# Patient Record
Sex: Male | Born: 1940 | Race: Black or African American | Hispanic: No | Marital: Single | Smoking: Former smoker
Health system: Southern US, Community
[De-identification: ages and names within clinical notes are randomized; demographics above are authoritative.]

## PROBLEM LIST (undated history)

## (undated) DIAGNOSIS — I739 Peripheral vascular disease, unspecified: Secondary | ICD-10-CM

## (undated) DIAGNOSIS — I517 Cardiomegaly: Secondary | ICD-10-CM

## (undated) DIAGNOSIS — I10 Essential (primary) hypertension: Secondary | ICD-10-CM

## (undated) DIAGNOSIS — M542 Cervicalgia: Secondary | ICD-10-CM

## (undated) DIAGNOSIS — E785 Hyperlipidemia, unspecified: Secondary | ICD-10-CM

## (undated) DIAGNOSIS — J40 Bronchitis, not specified as acute or chronic: Secondary | ICD-10-CM

## (undated) DIAGNOSIS — E559 Vitamin D deficiency, unspecified: Secondary | ICD-10-CM

## (undated) DIAGNOSIS — R600 Localized edema: Secondary | ICD-10-CM

## (undated) DIAGNOSIS — R0602 Shortness of breath: Secondary | ICD-10-CM

## (undated) DIAGNOSIS — N179 Acute kidney failure, unspecified: Secondary | ICD-10-CM

## (undated) DIAGNOSIS — D539 Nutritional anemia, unspecified: Secondary | ICD-10-CM

## (undated) DIAGNOSIS — J9601 Acute respiratory failure with hypoxia: Secondary | ICD-10-CM

## (undated) DIAGNOSIS — IMO0002 Reserved for concepts with insufficient information to code with codable children: Secondary | ICD-10-CM

## (undated) DIAGNOSIS — F1010 Alcohol abuse, uncomplicated: Secondary | ICD-10-CM

## (undated) DIAGNOSIS — K810 Acute cholecystitis: Secondary | ICD-10-CM

## (undated) DIAGNOSIS — I248 Other forms of acute ischemic heart disease: Secondary | ICD-10-CM

## (undated) DIAGNOSIS — Q249 Congenital malformation of heart, unspecified: Secondary | ICD-10-CM

## (undated) DIAGNOSIS — R5383 Other fatigue: Secondary | ICD-10-CM

## (undated) DIAGNOSIS — R570 Cardiogenic shock: Secondary | ICD-10-CM

## (undated) DIAGNOSIS — N183 Chronic kidney disease, stage 3 unspecified: Secondary | ICD-10-CM

## (undated) DIAGNOSIS — E119 Type 2 diabetes mellitus without complications: Secondary | ICD-10-CM

## (undated) DIAGNOSIS — N289 Disorder of kidney and ureter, unspecified: Secondary | ICD-10-CM

## (undated) DIAGNOSIS — I5042 Chronic combined systolic (congestive) and diastolic (congestive) heart failure: Secondary | ICD-10-CM

## (undated) DIAGNOSIS — I2489 Other forms of acute ischemic heart disease: Secondary | ICD-10-CM

## (undated) DIAGNOSIS — M549 Dorsalgia, unspecified: Secondary | ICD-10-CM

## (undated) DIAGNOSIS — R7989 Other specified abnormal findings of blood chemistry: Secondary | ICD-10-CM

## (undated) DIAGNOSIS — K8031 Calculus of bile duct with cholangitis, unspecified, with obstruction: Secondary | ICD-10-CM

## (undated) DIAGNOSIS — G8929 Other chronic pain: Secondary | ICD-10-CM

## (undated) DIAGNOSIS — Q600 Renal agenesis, unilateral: Secondary | ICD-10-CM

## (undated) HISTORY — DX: Acute kidney failure, unspecified: N17.9

## (undated) HISTORY — DX: Vitamin D deficiency, unspecified: E55.9

## (undated) HISTORY — DX: Acute respiratory failure with hypoxia: J96.01

## (undated) HISTORY — DX: Congenital malformation of heart, unspecified: Q24.9

## (undated) HISTORY — DX: Localized edema: R60.0

## (undated) HISTORY — DX: Other specified abnormal findings of blood chemistry: R79.89

## (undated) HISTORY — DX: Dorsalgia, unspecified: M54.9

## (undated) HISTORY — DX: Other fatigue: R53.83

## (undated) HISTORY — DX: Shortness of breath: R06.02

## (undated) HISTORY — DX: Bronchitis, not specified as acute or chronic: J40

## (undated) HISTORY — DX: Other chronic pain: G89.29

## (undated) HISTORY — DX: Essential (primary) hypertension: I10

## (undated) HISTORY — DX: Other forms of acute ischemic heart disease: I24.8

## (undated) HISTORY — DX: Other forms of acute ischemic heart disease: I24.89

## (undated) HISTORY — DX: Peripheral vascular disease, unspecified: I73.9

## (undated) HISTORY — DX: Cervicalgia: M54.2

## (undated) HISTORY — DX: Disorder of kidney and ureter, unspecified: N28.9

## (undated) HISTORY — DX: Cardiomegaly: I51.7

## (undated) HISTORY — DX: Alcohol abuse, uncomplicated: F10.10

## (undated) HISTORY — DX: Acute cholecystitis: K81.0

## (undated) HISTORY — DX: Nutritional anemia, unspecified: D53.9

---

## 2015-02-26 ENCOUNTER — Inpatient Hospital Stay (HOSPITAL_COMMUNITY): Payer: 59

## 2015-02-26 ENCOUNTER — Inpatient Hospital Stay (HOSPITAL_COMMUNITY)
Admission: EM | Admit: 2015-02-26 | Discharge: 2015-02-27 | DRG: 291 | Disposition: A | Discharge status: Home / Self Care | Payer: 59 | Attending: Internal Medicine | Admitting: Internal Medicine

## 2015-02-26 ENCOUNTER — Emergency Department (HOSPITAL_COMMUNITY): Payer: 59

## 2015-02-26 ENCOUNTER — Encounter (HOSPITAL_COMMUNITY): Payer: Self-pay | Admitting: Emergency Medicine

## 2015-02-26 DIAGNOSIS — E782 Mixed hyperlipidemia: Secondary | ICD-10-CM | POA: Diagnosis present

## 2015-02-26 DIAGNOSIS — J80 Acute respiratory distress syndrome: Secondary | ICD-10-CM | POA: Diagnosis not present

## 2015-02-26 DIAGNOSIS — F1010 Alcohol abuse, uncomplicated: Secondary | ICD-10-CM

## 2015-02-26 DIAGNOSIS — N183 Chronic kidney disease, stage 3 unspecified: Secondary | ICD-10-CM | POA: Insufficient documentation

## 2015-02-26 DIAGNOSIS — I429 Cardiomyopathy, unspecified: Secondary | ICD-10-CM | POA: Diagnosis present

## 2015-02-26 DIAGNOSIS — I509 Heart failure, unspecified: Secondary | ICD-10-CM

## 2015-02-26 DIAGNOSIS — I130 Hypertensive heart and chronic kidney disease with heart failure and stage 1 through stage 4 chronic kidney disease, or unspecified chronic kidney disease: Secondary | ICD-10-CM | POA: Diagnosis present

## 2015-02-26 DIAGNOSIS — I5023 Acute on chronic systolic (congestive) heart failure: Secondary | ICD-10-CM | POA: Diagnosis not present

## 2015-02-26 DIAGNOSIS — E119 Type 2 diabetes mellitus without complications: Secondary | ICD-10-CM | POA: Diagnosis not present

## 2015-02-26 DIAGNOSIS — E785 Hyperlipidemia, unspecified: Secondary | ICD-10-CM

## 2015-02-26 DIAGNOSIS — R0603 Acute respiratory distress: Secondary | ICD-10-CM | POA: Diagnosis present

## 2015-02-26 DIAGNOSIS — I248 Other forms of acute ischemic heart disease: Secondary | ICD-10-CM | POA: Insufficient documentation

## 2015-02-26 DIAGNOSIS — I5042 Chronic combined systolic (congestive) and diastolic (congestive) heart failure: Secondary | ICD-10-CM

## 2015-02-26 DIAGNOSIS — R0602 Shortness of breath: Secondary | ICD-10-CM | POA: Diagnosis present

## 2015-02-26 DIAGNOSIS — Z87891 Personal history of nicotine dependence: Secondary | ICD-10-CM | POA: Diagnosis not present

## 2015-02-26 DIAGNOSIS — J9601 Acute respiratory failure with hypoxia: Secondary | ICD-10-CM | POA: Diagnosis not present

## 2015-02-26 DIAGNOSIS — R778 Other specified abnormalities of plasma proteins: Secondary | ICD-10-CM

## 2015-02-26 DIAGNOSIS — R7989 Other specified abnormal findings of blood chemistry: Secondary | ICD-10-CM | POA: Diagnosis present

## 2015-02-26 DIAGNOSIS — N179 Acute kidney failure, unspecified: Secondary | ICD-10-CM | POA: Insufficient documentation

## 2015-02-26 DIAGNOSIS — I10 Essential (primary) hypertension: Secondary | ICD-10-CM | POA: Diagnosis not present

## 2015-02-26 DIAGNOSIS — I2489 Other forms of acute ischemic heart disease: Secondary | ICD-10-CM | POA: Insufficient documentation

## 2015-02-26 DIAGNOSIS — I5043 Acute on chronic combined systolic (congestive) and diastolic (congestive) heart failure: Secondary | ICD-10-CM | POA: Insufficient documentation

## 2015-02-26 DIAGNOSIS — R000 Tachycardia, unspecified: Secondary | ICD-10-CM | POA: Diagnosis present

## 2015-02-26 HISTORY — DX: Reserved for concepts with insufficient information to code with codable children: IMO0002

## 2015-02-26 HISTORY — DX: Renal agenesis, unilateral: Q60.0

## 2015-02-26 HISTORY — DX: Hyperlipidemia, unspecified: E78.5

## 2015-02-26 HISTORY — DX: Essential (primary) hypertension: I10

## 2015-02-26 HISTORY — DX: Other specified abnormal findings of blood chemistry: R79.89

## 2015-02-26 HISTORY — DX: Type 2 diabetes mellitus without complications: E11.9

## 2015-02-26 HISTORY — DX: Alcohol abuse, uncomplicated: F10.10

## 2015-02-26 HISTORY — DX: Other specified abnormalities of plasma proteins: R77.8

## 2015-02-26 LAB — I-STAT TROPONIN, ED: Troponin i, poc: 0.1 ng/mL (ref 0.00–0.08)

## 2015-02-26 LAB — COMPREHENSIVE METABOLIC PANEL
ALT: 24 U/L (ref 17–63)
AST: 34 U/L (ref 15–41)
Albumin: 3.8 g/dL (ref 3.5–5.0)
Alkaline Phosphatase: 56 U/L (ref 38–126)
Anion gap: 10 (ref 5–15)
BUN: 21 mg/dL — ABNORMAL HIGH (ref 6–20)
CO2: 20 mmol/L — ABNORMAL LOW (ref 22–32)
Calcium: 8.5 mg/dL — ABNORMAL LOW (ref 8.9–10.3)
Chloride: 108 mmol/L (ref 101–111)
Creatinine, Ser: 1.56 mg/dL — ABNORMAL HIGH (ref 0.61–1.24)
GFR calc Af Amer: 50 mL/min — ABNORMAL LOW (ref >60)
GFR calc non Af Amer: 43 mL/min — ABNORMAL LOW (ref >60)
Glucose, Bld: 242 mg/dL — ABNORMAL HIGH (ref 65–99)
Potassium: 3.8 mmol/L (ref 3.5–5.1)
Sodium: 138 mmol/L (ref 135–145)
Total Bilirubin: 0.7 mg/dL (ref 0.3–1.2)
Total Protein: 8.3 g/dL — ABNORMAL HIGH (ref 6.5–8.1)

## 2015-02-26 LAB — I-STAT ARTERIAL BLOOD GAS, ED
Acid-base deficit: 6 mmol/L — ABNORMAL HIGH (ref 0.0–2.0)
Bicarbonate: 22.7 mEq/L (ref 20.0–24.0)
O2 Saturation: 100 %
Patient temperature: 98.6
TCO2: 24 mmol/L (ref 0–100)
pCO2 arterial: 58.7 mmHg (ref 35.0–45.0)
pH, Arterial: 7.196 — CL (ref 7.350–7.450)
pO2, Arterial: 280 mmHg — ABNORMAL HIGH (ref 80.0–100.0)

## 2015-02-26 LAB — LIPID PANEL
Cholesterol: 208 mg/dL — ABNORMAL HIGH (ref 0–200)
HDL: 44 mg/dL (ref >40)
LDL Cholesterol: 135 mg/dL — ABNORMAL HIGH (ref 0–99)
Total CHOL/HDL Ratio: 4.7 RATIO
Triglycerides: 146 mg/dL (ref <150)
VLDL: 29 mg/dL (ref 0–40)

## 2015-02-26 LAB — CBC
HCT: 42.3 % (ref 39.0–52.0)
Hemoglobin: 13.6 g/dL (ref 13.0–17.0)
MCH: 30.8 pg (ref 26.0–34.0)
MCHC: 32.2 g/dL (ref 30.0–36.0)
MCV: 95.9 fL (ref 78.0–100.0)
Platelets: 219 10*3/uL (ref 150–400)
RBC: 4.41 MIL/uL (ref 4.22–5.81)
RDW: 13.9 % (ref 11.5–15.5)
WBC: 10.4 10*3/uL (ref 4.0–10.5)

## 2015-02-26 LAB — CBC WITH DIFFERENTIAL/PLATELET
Basophils Absolute: 0.1 10*3/uL (ref 0.0–0.1)
Basophils Relative: 1 % (ref 0–1)
Eosinophils Absolute: 0.3 10*3/uL (ref 0.0–0.7)
Eosinophils Relative: 3 % (ref 0–5)
HCT: 43.2 % (ref 39.0–52.0)
Hemoglobin: 13.7 g/dL (ref 13.0–17.0)
Lymphocytes Relative: 39 % (ref 12–46)
Lymphs Abs: 3.7 10*3/uL (ref 0.7–4.0)
MCH: 30.6 pg (ref 26.0–34.0)
MCHC: 31.7 g/dL (ref 30.0–36.0)
MCV: 96.4 fL (ref 78.0–100.0)
Monocytes Absolute: 0.4 10*3/uL (ref 0.1–1.0)
Monocytes Relative: 4 % (ref 3–12)
Neutro Abs: 5.1 10*3/uL (ref 1.7–7.7)
Neutrophils Relative %: 53 % (ref 43–77)
Platelets: 253 10*3/uL (ref 150–400)
RBC: 4.48 MIL/uL (ref 4.22–5.81)
RDW: 13.9 % (ref 11.5–15.5)
WBC: 9.5 10*3/uL (ref 4.0–10.5)

## 2015-02-26 LAB — TROPONIN I
Troponin I: 0.05 ng/mL — ABNORMAL HIGH (ref <0.031)
Troponin I: 0.11 ng/mL — ABNORMAL HIGH (ref <0.031)
Troponin I: 0.09 ng/mL — ABNORMAL HIGH (ref <0.031)
Troponin I: 0.09 ng/mL — ABNORMAL HIGH (ref <0.031)

## 2015-02-26 LAB — TSH: TSH: 1.106 u[IU]/mL (ref 0.350–4.500)

## 2015-02-26 LAB — MRSA PCR SCREENING: MRSA by PCR: NEGATIVE

## 2015-02-26 LAB — GLUCOSE, CAPILLARY
Glucose-Capillary: 169 mg/dL — ABNORMAL HIGH (ref 65–99)
Glucose-Capillary: 156 mg/dL — ABNORMAL HIGH (ref 65–99)
Glucose-Capillary: 180 mg/dL — ABNORMAL HIGH (ref 65–99)
Glucose-Capillary: 161 mg/dL — ABNORMAL HIGH (ref 65–99)

## 2015-02-26 LAB — MAGNESIUM: Magnesium: 2.4 mg/dL (ref 1.7–2.4)

## 2015-02-26 LAB — PROTIME-INR
INR: 1.11 (ref 0.00–1.49)
Prothrombin Time: 14.5 seconds (ref 11.6–15.2)

## 2015-02-26 LAB — APTT: aPTT: 27 seconds (ref 24–37)

## 2015-02-26 LAB — BRAIN NATRIURETIC PEPTIDE: B Natriuretic Peptide: 405.5 pg/mL — ABNORMAL HIGH (ref 0.0–100.0)

## 2015-02-26 LAB — CREATININE, SERUM
Creatinine, Ser: 1.72 mg/dL — ABNORMAL HIGH (ref 0.61–1.24)
GFR calc Af Amer: 44 mL/min — ABNORMAL LOW (ref >60)
GFR calc non Af Amer: 38 mL/min — ABNORMAL LOW (ref >60)

## 2015-02-26 MED ORDER — LORAZEPAM 2 MG/ML IJ SOLN: 1.0000 mg | Freq: Four times a day (QID) | INTRAMUSCULAR | Status: DC | PRN

## 2015-02-26 MED ORDER — LORAZEPAM 2 MG/ML IJ SOLN
INTRAMUSCULAR | Status: AC
  Filled 2015-02-26: qty 1

## 2015-02-26 MED ORDER — SODIUM CHLORIDE 0.9 % IJ SOLN
3.0000 mL | Freq: Two times a day (BID) | INTRAMUSCULAR | Status: DC
  Administered 2015-02-26 (×2): 3 mL via INTRAVENOUS

## 2015-02-26 MED ORDER — LORAZEPAM 2 MG/ML IJ SOLN: 0.0000 mg | Freq: Four times a day (QID) | INTRAMUSCULAR | Status: DC

## 2015-02-26 MED ORDER — SODIUM CHLORIDE 0.9 % IJ SOLN: 3.0000 mL | INTRAMUSCULAR | Status: DC | PRN

## 2015-02-26 MED ORDER — AMLODIPINE BESYLATE 10 MG PO TABS
10.0000 mg | ORAL_TABLET | Freq: Every day | ORAL | Status: DC
  Administered 2015-02-26: 10 mg via ORAL
  Filled 2015-02-26 (×2): qty 1

## 2015-02-26 MED ORDER — NITROGLYCERIN 0.1 MG/HR TD PT24
0.1000 mg | MEDICATED_PATCH | Freq: Every day | TRANSDERMAL | Status: DC
  Administered 2015-02-26: 0.1 mg via TRANSDERMAL
  Filled 2015-02-26 (×2): qty 1

## 2015-02-26 MED ORDER — VITAMIN B-1 100 MG PO TABS
100.0000 mg | ORAL_TABLET | Freq: Every day | ORAL | Status: DC
  Administered 2015-02-26 – 2015-02-27 (×2): 100 mg via ORAL
  Filled 2015-02-26 (×2): qty 1

## 2015-02-26 MED ORDER — FUROSEMIDE 10 MG/ML IJ SOLN
40.0000 mg | Freq: Once | INTRAMUSCULAR | Status: AC
Start: 2015-02-26 — End: 2015-02-26
  Administered 2015-02-26: 40 mg via INTRAVENOUS
  Filled 2015-02-26: qty 4

## 2015-02-26 MED ORDER — NITROGLYCERIN IN D5W 200-5 MCG/ML-% IV SOLN
0.0000 ug/min | Freq: Once | INTRAVENOUS | Status: AC
  Administered 2015-02-26: 5 ug/min via INTRAVENOUS
  Filled 2015-02-26: qty 250

## 2015-02-26 MED ORDER — ASPIRIN EC 325 MG PO TBEC
325.0000 mg | DELAYED_RELEASE_TABLET | Freq: Every day | ORAL | Status: DC
  Administered 2015-02-26: 325 mg via ORAL
  Filled 2015-02-26: qty 1

## 2015-02-26 MED ORDER — CARVEDILOL 6.25 MG PO TABS
6.2500 mg | ORAL_TABLET | Freq: Two times a day (BID) | ORAL | Status: DC
  Administered 2015-02-26 – 2015-02-27 (×3): 6.25 mg via ORAL
  Filled 2015-02-26 (×5): qty 1

## 2015-02-26 MED ORDER — DM-GUAIFENESIN ER 30-600 MG PO TB12
1.0000 | ORAL_TABLET | Freq: Two times a day (BID) | ORAL | Status: DC
  Administered 2015-02-26 – 2015-02-27 (×3): 1 via ORAL
  Filled 2015-02-26 (×4): qty 1

## 2015-02-26 MED ORDER — IPRATROPIUM-ALBUTEROL 0.5-2.5 (3) MG/3ML IN SOLN: 3.0000 mL | RESPIRATORY_TRACT | Status: DC | PRN

## 2015-02-26 MED ORDER — SODIUM CHLORIDE 0.9 % IV SOLN: 250.0000 mL | INTRAVENOUS | Status: DC | PRN

## 2015-02-26 MED ORDER — ONDANSETRON HCL 4 MG/2ML IJ SOLN: 4.0000 mg | Freq: Four times a day (QID) | INTRAMUSCULAR | Status: DC | PRN

## 2015-02-26 MED ORDER — METOPROLOL TARTRATE 1 MG/ML IV SOLN
5.0000 mg | Freq: Once | INTRAVENOUS | Status: AC
  Administered 2015-02-26: 5 mg via INTRAVENOUS

## 2015-02-26 MED ORDER — LORAZEPAM 2 MG/ML IJ SOLN
0.5000 mg | Freq: Once | INTRAMUSCULAR | Status: AC
  Administered 2015-02-26: 0.5 mg via INTRAVENOUS

## 2015-02-26 MED ORDER — LORAZEPAM 1 MG PO TABS: 1.0000 mg | ORAL_TABLET | Freq: Four times a day (QID) | ORAL | Status: DC | PRN

## 2015-02-26 MED ORDER — THIAMINE HCL 100 MG/ML IJ SOLN
100.0000 mg | Freq: Every day | INTRAMUSCULAR | Status: DC
  Filled 2015-02-26 (×2): qty 1

## 2015-02-26 MED ORDER — FERROUS SULFATE 325 (65 FE) MG PO TABS
325.0000 mg | ORAL_TABLET | Freq: Every day | ORAL | Status: DC
  Administered 2015-02-26 – 2015-02-27 (×2): 325 mg via ORAL
  Filled 2015-02-26 (×3): qty 1

## 2015-02-26 MED ORDER — INSULIN ASPART 100 UNIT/ML ~~LOC~~ SOLN
0.0000 [IU] | Freq: Three times a day (TID) | SUBCUTANEOUS | Status: DC
  Administered 2015-02-26 – 2015-02-27 (×3): 2 [IU] via SUBCUTANEOUS

## 2015-02-26 MED ORDER — LORAZEPAM 2 MG/ML IJ SOLN: 0.0000 mg | Freq: Two times a day (BID) | INTRAMUSCULAR | Status: DC

## 2015-02-26 MED ORDER — SIMVASTATIN 40 MG PO TABS
40.0000 mg | ORAL_TABLET | Freq: Every day | ORAL | Status: DC
  Administered 2015-02-26: 40 mg via ORAL
  Filled 2015-02-26 (×2): qty 1

## 2015-02-26 MED ORDER — ADULT MULTIVITAMIN W/MINERALS CH
1.0000 | ORAL_TABLET | Freq: Every day | ORAL | Status: DC
  Administered 2015-02-26 – 2015-02-27 (×2): 1 via ORAL
  Filled 2015-02-26 (×2): qty 1

## 2015-02-26 MED ORDER — NITROGLYCERIN IN D5W 200-5 MCG/ML-% IV SOLN
0.0000 ug/min | INTRAVENOUS | Status: DC
  Administered 2015-02-26: 5 ug/min via INTRAVENOUS
  Filled 2015-02-26: qty 250

## 2015-02-26 MED ORDER — ACETAMINOPHEN 325 MG PO TABS: 650.0000 mg | ORAL_TABLET | ORAL | Status: DC | PRN

## 2015-02-26 MED ORDER — FUROSEMIDE 10 MG/ML IJ SOLN
40.0000 mg | Freq: Every day | INTRAMUSCULAR | Status: DC
Start: 2015-02-26 — End: 2015-02-27
  Administered 2015-02-26: 40 mg via INTRAVENOUS
  Filled 2015-02-26 (×2): qty 4

## 2015-02-26 MED ORDER — FOLIC ACID 1 MG PO TABS
1.0000 mg | ORAL_TABLET | Freq: Every day | ORAL | Status: DC
  Administered 2015-02-26 – 2015-02-27 (×2): 1 mg via ORAL
  Filled 2015-02-26 (×2): qty 1

## 2015-02-26 MED ORDER — METHYLPREDNISOLONE SODIUM SUCC 40 MG IJ SOLR
40.0000 mg | Freq: Every day | INTRAMUSCULAR | Status: DC
  Administered 2015-02-26: 40 mg via INTRAVENOUS
  Filled 2015-02-26: qty 1

## 2015-02-26 MED ORDER — HYDRALAZINE HCL 20 MG/ML IJ SOLN: 5.0000 mg | INTRAMUSCULAR | Status: DC | PRN

## 2015-02-26 MED ORDER — HEPARIN SODIUM (PORCINE) 5000 UNIT/ML IJ SOLN
5000.0000 [IU] | Freq: Three times a day (TID) | INTRAMUSCULAR | Status: DC
  Filled 2015-02-26 (×3): qty 1

## 2015-02-26 NOTE — Progress Notes (Signed)
PT Cancellation Note  Patient Details Name: [name] MRN: [id number] DOB: 12/10/1940   Cancelled Treatment:    Reason Eval/Treat Not Completed: Medical issues which prohibited therapy Noted that troponin elevated further since admission. Per departmental protocol will hold until troponin demonstrates downward trend or cleared by MD.  Ellouise Newer 02/26/2015, 8:55 AM Elayne Snare, [place]

## 2015-02-26 NOTE — Progress Notes (Signed)
  Echocardiogram 2D Echocardiogram has been performed.  Donata Clay 02/26/2015, 9:52 AM

## 2015-02-26 NOTE — Progress Notes (Signed)
Utilization review completed. Bertha Stanfill, RN, BSN. 

## 2015-02-26 NOTE — Progress Notes (Signed)
Spoke with Dr. Blaine Hamper about continuous NTG gtt. Orders received to continue gtt until 1000 when patch is due. Will continue to monitor.

## 2015-02-26 NOTE — Progress Notes (Signed)
RT took pt off of BiPAP at this time, pt tolerating well. Placed pt on 4L nasal cannula O2 sats 95% and greater with a HR of 87. RT will continue to monitor.

## 2015-02-26 NOTE — Progress Notes (Signed)
Attempted report 

## 2015-02-26 NOTE — Progress Notes (Signed)
ABG performed per MD order, 02 turned down to 60%. RT will continue to monitor.

## 2015-02-26 NOTE — Progress Notes (Signed)
PROGRESS NOTE    [name] [id number] DOB: 06-10-1941 DOA: 02/26/2015 PCP: No PCP Per Patient  HPI/Brief narrative 74 year old male, spends his time between [place], PCP/cardiologist/pulmonologist in [place], PMH of HTN, HLD, DM 2, chronic CHF-unknown type, CKD/solitary kidney, states that he has not been on diuretics for one year because he did not follow-up with his cardiologist, presented with 3 week history of intermittent dyspnea which progressively got worse on night of admission. On EMS arrival, patient's oxygen saturation at 70%; he was placed on CPAP at that time, improving spO2 until patient removed CPAP. SpO2 dropped back down to 72% and CPAP was replaced en route, improving spO2 to 88% again. He was given 125mg  solumedrol and mag sulfate 2g en route. Patient was treated with 40 mg IV Lasix, metoprolol and nitroglycerin drip in emergency room, with significant improvement of his symptoms.  In ED, patient was found to have troponin 0.05, BNP 405.5, WBC 9.5, temperature normal, tachycardia, Cre=1.56 and BUN 21. Chest x-ray is consistent with CHF exacerbation. ABG showed pH 7.196, PCO2 58.7, PO2 280. Patient is admitted to inpatient for further evaluation and treatment.   Assessment/Plan:  CHF exacerbation-unknown type (no echo in our system) - Admitted to stepdown unit and started on IV Lasix and IV NTG drip - Clinically improved. - Continue IV Lasix: note creatinine has bumped from 1.56>1.72 (baseline creatinine not known) - Continue aspirin and Coreg. Not on ACEI/ARB secondary to chronic kidney disease. - Patient on IV NTG at 5 mcg-DC. - Follow 2-D echo results. - Mild troponin elevation may be secondary to demand ischemia from acute respiratory failure, CHF exacerbation and chronic kidney disease. - Cardiology has been consulted for assistance. - Requested medical records from patient's PCP/cardiologist in [place] area.  Elevated troponin -  Possibly from demand ischemia. No reported chest pain. - Troponin trend: 0.10 > 0.05 > 0.11 - Continue aspirin and Coreg - Follow 2-D echo - Cardiology consulted. EDP had discussed with cardiology who felt his presentation was most likely due to CHF exacerbation rather than ACS  Acute respiratory failure with hypoxia - Likely from CHF exacerbation. - Although COPD exacerbation, PNA and PE are on differentials, due to patient's history/presentation and response to diuresis, felt to be less likely. - Titrate oxygen as tolerated.  Essential Hypertension - Initial blood pressure was 196/102, which improved to 166/94 after brief treatment with IV Lasix, metoprolol and nitroglycerin drip in the emergency room. -continue lasix iv as above -switch NTG gtt to patch -started amlodipine -Continue home Coreg -IV hydralazine when necessary - Improved  Hyperlipidemia - LDL 135 -continue Zocor  DM-II - No A1c on record. Patient is taking glipizide at home. 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 Baseline creatinine not known. - Creatinine has jumped overnight to 1.72. Likely due to diuresis. - Requested records from PCP to see what his baseline creatinine was - Follow BMP closely    DVT ppx: SQ Heparin Code Status: Full Family Communication: None at bedside Disposition Plan: DC home when medically stable   Consultants:  Cardiology  Procedures:  None  Antibiotics:  None   Subjective: Feels much better. Dyspnea resolved. Never had chest pain. Denies cough. Denies history of orthopnea or PND.  Objective: Filed Vitals:  02/26/15 [id number] 02/26/15 0615 02/26/15 0635 02/26/15 0800  BP:  147/74 157/86   Pulse:  81 87   Temp:   97.7 F (36.5 C)  97.9 F (36.6 C)  TempSrc:   Oral Oral  Resp:  22 20   Height:   5\' 11"  (1.803 m)   Weight:   96.6 kg (212 lb 15.4 oz)   SpO2: 96% 96% 95%     Intake/Output Summary (Last 24 hours) at 02/26/15 1158 Last data filed at 02/26/15 0846  Gross per 24 hour  Intake      0 ml  Output   1650 ml  Net  -1650 ml   Filed Weights   02/26/15 0152 02/26/15 0635  Weight: 98.884 kg (218 lb) 96.6 kg (212 lb 15.4 oz)     Exam:  General exam: Pleasant elderly male lying comfortably supine in bed Respiratory system: Few basal crackles, left >right. Rest of the lung fields clear to auscultation without wheezing or rhonchi. No increased work of breathing. Cardiovascular system: S1 & S2 heard, RRR. No JVD, murmurs, gallops, clicks or pedal edema. Telemetry: Presented with sinus tachycardia in the 130s but currently in sinus rhythm. Gastrointestinal system: Abdomen is nondistended, soft and nontender. Normal bowel sounds heard. Central nervous system: Alert and oriented. No focal neurological deficits. Extremities: Symmetric 5 x 5 power.   Data Reviewed: Basic Metabolic Panel:  Recent Labs Lab 02/26/15 0202 02/26/15 0711  NA 138  --   K 3.8  --   CL 108  --   CO2 20*  --   GLUCOSE 242*  --   BUN 21*  --   CREATININE 1.56* 1.72*  CALCIUM 8.5*  --   MG  --  2.4   Liver Function Tests:  Recent Labs Lab 02/26/15 0202  AST 34  ALT 24  ALKPHOS 56  BILITOT 0.7  PROT 8.3*  ALBUMIN 3.8   No results for input(s): LIPASE, AMYLASE in the last 168 hours. No results for input(s): AMMONIA in the last 168 hours. CBC:  Recent Labs Lab 02/26/15 0202 02/26/15 0711  WBC 9.5 10.4  NEUTROABS 5.1  --   HGB 13.7 13.6  HCT 43.2 42.3  MCV 96.4 95.9  PLT 253 219   Cardiac Enzymes:  Recent Labs Lab 02/26/15 0202 02/26/15 0711  TROPONINI 0.05* 0.11*   BNP (last 3 results) No results for input(s): PROBNP in the last 8760 hours. CBG:  Recent Labs Lab 02/26/15 0751  GLUCAP 169*     Recent Results (from the past 240 hour(s))  MRSA PCR Screening     Status: None   Collection Time: 02/26/15  6:50 AM  Result Value Ref Range Status   MRSA by PCR NEGATIVE NEGATIVE Final    Comment:        The GeneXpert MRSA Assay (FDA approved for NASAL specimens only), is one component of a comprehensive MRSA colonization surveillance program. It is not intended to diagnose MRSA infection nor to guide or monitor treatment for MRSA infections.          Studies: Dg Chest Portable 1 View  02/26/2015   CLINICAL DATA:  Respiratory distress  EXAM: PORTABLE CHEST - 1 VIEW  COMPARISON:  None.  FINDINGS: Diffuse airspace opacities are present throughout both lungs. This most likely represents alveolar edema. This could represent ARDS. Infectious infiltrates not excluded. There is no large effusion. There is no pneumothorax. There is mild cardiomegaly.  IMPRESSION: Diffuse airspace opacities, likely alveolar edema from CHF.  Electronically Signed   By: Andreas Newport M.D.   On: 02/26/2015 02:51        Scheduled Meds: . amLODipine  10 mg Oral Daily  . aspirin EC  325 mg Oral Daily  . carvedilol  6.25 mg Oral BID WC  . dextromethorphan-guaiFENesin  1 tablet Oral BID  . ferrous sulfate  325 mg Oral Q breakfast  . folic acid  1 mg Oral Daily  . furosemide  40 mg Intravenous Daily  . heparin  5,000 Units Subcutaneous 3 times per day  . insulin aspart  0-9 Units Subcutaneous TID WC  . LORazepam  0-4 mg Intravenous 4 times per day   Followed by  . [START ON 02/28/2015] LORazepam  0-4 mg Intravenous Q12H  . methylPREDNISolone (SOLU-MEDROL) injection  40 mg Intravenous Daily  . multivitamin with minerals  1 tablet Oral Daily  . nitroGLYCERIN  0.1 mg Transdermal Daily  . simvastatin  40 mg Oral q1800  . sodium chloride  3 mL Intravenous Q12H  . thiamine  100 mg Oral Daily   Or  . thiamine  100 mg Intravenous Daily   Continuous Infusions: . nitroGLYCERIN 5 mcg/min (02/26/15  0700)    Principal Problem:   CHF exacerbation Active Problems:   Hypertension   Hyperlipidemia   Diabetes mellitus without complication   Acute respiratory distress   Alcohol abuse   Elevated troponin    Time spent: 40 minutes    HONGALGI,ANAND, MD, FACP, FHM. [facility] Pager [phone]  If 7PM-7AM, please contact night-coverage [email or web address] Password [email or web address] 02/26/2015, 11:58 AM    LOS: 0 days

## 2015-02-26 NOTE — Consult Note (Signed)
CARDIOLOGY CONSULT NOTE   Patient ID: [name] MRN: [id number], DOB/AGE: 01-29-1941   Admit date: 02/26/2015 Date of Consult: 02/26/2015  Primary Physician: No PCP Per Patient Primary Cardiologist: None  Reason for consult:  Shortness of breath  Problem List  Past Medical History  Diagnosis Date  . CHF (congestive heart failure)   . Hypertension   . Hyperlipidemia   . Diabetes mellitus without complication   . Solitary kidney     History reviewed. No pertinent past surgical history.   Allergies  No Known Allergies  HPI   74 y.o. male with PMH of hypertension, hyperlipidemia, diabetes mellitus, CHF, CKD (unclear stage), solitary kidney, who presents with shortness breath. Patient is from [place], comes here for visiting his family. Patient reports that he started having shortness of breath at about 11:30 last night. He has mild cough, without sputum production. On EMS arrival, patient's oxygen saturation at 70%; he was placed on CPAP at that time, improving spO2 until patient removed CPAP. SpO2 dropped back down to 72% and CPAP was replaced en route, improving spO2 to 88% again. He was given 125mg  solumedrol and mag sulfate 2g en route. Currently, patient dose not have chest pain, fever or chills. No leg edema, abdominal pain, diarrhea, symptoms for UTI, unilateral weakness. Tenderness over calf areas. Patient was treated with 40 mg IV Lasix, metoprolol and nitroglycerin drip in emergency room, with significant improvement of his symptoms.  The patient was found to have troponin 0.05, BNP 405.5, WBC 9.5, temperature normal, tachycardia, Crea 1.56 and BUN 21. Chest x-ray is consistent with CHF exacerbation. ABG showed pH 7.196, PCO2 58.7, PO2 280.   The patient currently states that his SOB has improved. He has never had chest pain, he has had a stress test "few years ago" that was normal. No FH of premature CAD, his mother died suddenly at sleep at age  40.   Inpatient Medications  . amLODipine  10 mg Oral Daily  . aspirin EC  325 mg Oral Daily  . carvedilol  6.25 mg Oral BID WC  . dextromethorphan-guaiFENesin  1 tablet Oral BID  . ferrous sulfate  325 mg Oral Q breakfast  . folic acid  1 mg Oral Daily  . furosemide  40 mg Intravenous Daily  . heparin  5,000 Units Subcutaneous 3 times per day  . insulin aspart  0-9 Units Subcutaneous TID WC  . LORazepam  0-4 mg Intravenous 4 times per day   Followed by  . [START ON 02/28/2015] LORazepam  0-4 mg Intravenous Q12H  . methylPREDNISolone (SOLU-MEDROL) injection  40 mg Intravenous Daily  . multivitamin with minerals  1 tablet Oral Daily  . nitroGLYCERIN  0.1 mg Transdermal Daily  . simvastatin  40 mg Oral q1800  . sodium chloride  3 mL Intravenous Q12H  . thiamine  100 mg Oral Daily   Or  . thiamine  100 mg Intravenous Daily   Family History History reviewed. No pertinent family history.   Social History History   Social History  . Marital Status: Married    Spouse Name: N/A  . Number of Children: N/A  . Years of Education: N/A   Occupational History  . Not on file.   Social History Main Topics  . Smoking status: Former Smoker    Types: Cigarettes    Quit date: 02/26/2008  . Smokeless tobacco: Not on file  . Alcohol Use: Yes     Comment: occ  . Drug Use:  No  . Sexual Activity: Not on file   Other Topics Concern  . Not on file   Social History Narrative  . No narrative on file    Review of Systems  General:  No chills, fever, night sweats or weight changes.  Cardiovascular:  No chest pain, dyspnea on exertion, edema, orthopnea, palpitations, paroxysmal nocturnal dyspnea. Dermatological: No rash, lesions/masses Respiratory: No cough, dyspnea Urologic: No hematuria, dysuria Abdominal:   No nausea, vomiting, diarrhea, bright red blood per rectum, melena, or hematemesis Neurologic:  No visual changes, wkns, changes in mental status. All other systems reviewed and  are otherwise negative except as noted above.  Physical Exam  Blood pressure 157/86, pulse 87, temperature 97.9 F (36.6 C), temperature source Oral, resp. rate 20, height 5\' 11"  (1.803 m), weight 212 lb 15.4 oz (96.6 kg), SpO2 95 %.  General: Pleasant, NAD Psych: Normal affect. Neuro: Alert and oriented X 3. Moves all extremities spontaneously. HEENT: Normal  Neck: Supple without bruits or JVD. Lungs:  Resp regular and unlabored, mild crackles at the bases. Heart: RRR no s3, s4, or murmurs. Abdomen: Soft, non-tender, non-distended, BS + x 4.  Extremities: No clubbing, cyanosis or edema. DP/PT/Radials 2+ and equal bilaterally.  Labs  Recent Labs  02/26/15 0202 02/26/15 0711  TROPONINI 0.05* 0.11*   Lab Results  Component Value Date   WBC 10.4 02/26/2015   HGB 13.6 02/26/2015   HCT 42.3 02/26/2015   MCV 95.9 02/26/2015   PLT 219 02/26/2015    Recent Labs Lab 02/26/15 0202 02/26/15 0711  NA 138  --   K 3.8  --   CL 108  --   CO2 20*  --   BUN 21*  --   CREATININE 1.56* 1.72*  CALCIUM 8.5*  --   PROT 8.3*  --   BILITOT 0.7  --   ALKPHOS 56  --   ALT 24  --   AST 34  --   GLUCOSE 242*  --    Lab Results  Component Value Date   CHOL 208* 02/26/2015   HDL 44 02/26/2015   LDLCALC 135* 02/26/2015   TRIG 146 02/26/2015   Radiology/Studies  Dg Chest Portable 1 View  02/26/2015   CLINICAL DATA:  Respiratory distress  EXAM: PORTABLE CHEST - 1 VIEW  COMPARISON:  None.  FINDINGS: Diffuse airspace opacities are present throughout both lungs. This most likely represents alveolar edema. This could represent ARDS. Infectious infiltrates not excluded. There is no large effusion. There is no pneumothorax. There is mild cardiomegaly.  IMPRESSION: Diffuse airspace opacities, likely alveolar edema from CHF.   Electronically Signed   By: Andreas Newport M.D.   On: 02/26/2015 02:51   Echocardiogram - pending  ECG: SR, LVH with repolarization abnormalities and negative T waves  in the lateral leads suggestive of ischemia    ASSESSMENT AND PLAN   74 year old male   1. Acute on chronic CHF exacerbation - echo pending, unknown systolic and diastolic function,  Switch to PO lasix 40 mg po daily, he diuresed 1600 ml overnight with worsening Crea 1.56--> 1.72, no significant fluid overload on exam. BNP 400.  2. Troponin elevation - Troponin 0.05 --> 0.11, in the settings of CHF and acute on chronic kidney failure, continue to check until downtrending. ECG shows significant LVH but also possible lateral ischemia, schedule a nuclear stress test once troponin downtrending, possibly tomorrow or on Friday, add atorvastatin 40 mg po daily.  3. Hypertension - increase  carvedilol to 12.5 mg po BID, if further control needed, add mitrate/hydralazine, avoid ACEI/ARB  Signed, Dorothy Spark, MD, [facility] 02/26/2015, 11:36 AM

## 2015-02-26 NOTE — ED Provider Notes (Addendum)
CSN: [id number]     Arrival date & time 02/26/15  0148 History  This chart was scribed for Merryl Hacker, MD by Rayfield Citizen, ED Scribe. This patient was seen in room [facility] and the patient's care was started at 1:45 AM.   Level 5 Caveat; Respiratory Distress   Chief Complaint  Patient presents with  . Respiratory Distress   The history is provided by the patient and the EMS personnel. The history is limited by the condition of the patient. No language interpreter was used.     HPI Comments: Erika Overmiller is a 74 y.o. male with past medical history of HTN, CHF, DM2 who presents to the Emergency Department in respiratory distress. Per EMS, patient called for SOB. On EMS arrival, patient was in "mild respiratory distress" but able to converse. Patient's spO2 at 70%; he was placed on CPAP at that time, improving spO2 until patient removed CPAP. SpO2 dropped back down to 72% and CPAP was replaced en route, improving spO2 to 88% again. He was given 125mg  solumedrol and mag sulfate 2g en route. Due to patient's heart rate over 150, no epi was given en route. Patient denies CP at present; he notes his symptoms have improved slightly in the ED.  Per EMS, patient denied any history of COPD. Patient is a former smoker. He takes metoprolol daily; he denies recent medication changes.   Past Medical History  Diagnosis Date  . CHF (congestive heart failure)   . Hypertension   . Hyperlipidemia   . Diabetes mellitus without complication    History reviewed. No pertinent past surgical history. History reviewed. No pertinent family history. History  Substance Use Topics  . Smoking status: Former Smoker    Types: Cigarettes    Quit date: 02/26/2008  . Smokeless tobacco: Not on file  . Alcohol Use: Yes     Comment: occ    Review of Systems  Unable to perform ROS: Acuity of condition      Allergies  Review of patient's allergies indicates no known allergies.  Home Medications    Prior to Admission medications   Not on File   BP 134/77 mmHg  Pulse 89  Temp(Src) 97.9 F (36.6 C) (Axillary)  Resp 20  Ht 5\' 11"  (1.803 m)  Wt 218 lb (98.884 kg)  BMI 30.42 kg/m2  SpO2 100% Physical Exam  Constitutional: He is oriented to person, place, and time.  Anxious, diaphoretic  HENT:  Head: Normocephalic and atraumatic.  Neck: JVD present.  Cardiovascular: Regular rhythm and normal heart sounds.   No murmur heard. Tachycardia  Pulmonary/Chest: Effort normal. No respiratory distress. He has wheezes.  Decreased breath sounds all lobes, scant expiratory squeaks, crackles noted  Abdominal: Soft. Bowel sounds are normal. There is no tenderness. There is no rebound.  Musculoskeletal: He exhibits edema.  Trace bilateral lower extremity edema  Neurological: He is alert and oriented to person, place, and time.  Skin: Skin is warm and dry.  Psychiatric: He has a normal mood and affect.  Nursing note and vitals reviewed.   ED Course  Procedures   CRITICAL CARE Performed by: Merryl Hacker   Total critical care time: 45 min  Critical care time was exclusive of separately billable procedures and treating other patients.  Critical care was necessary to treat or prevent imminent or life-threatening deterioration.  Critical care was time spent personally by me on the following activities: development of treatment plan with patient and/or surrogate as well as nursing,  discussions with consultants, evaluation of patient's response to treatment, examination of patient, obtaining history from patient or surrogate, ordering and performing treatments and interventions, ordering and review of laboratory studies, ordering and review of radiographic studies, pulse oximetry and re-evaluation of patient's condition.   COORDINATION OF CARE: 2:15 AM Discussed treatment plan with pt and family at bedside and all agreed to plan.   Labs Review Labs Reviewed  COMPREHENSIVE  METABOLIC PANEL - Abnormal; Notable for the following:    CO2 20 (*)    Glucose, Bld 242 (*)    BUN 21 (*)    Creatinine, Ser 1.56 (*)    Calcium 8.5 (*)    Total Protein 8.3 (*)    GFR calc non Af Amer 43 (*)    GFR calc Af Amer 50 (*)    All other components within normal limits  BRAIN NATRIURETIC PEPTIDE - Abnormal; Notable for the following:    B Natriuretic Peptide 405.5 (*)    All other components within normal limits  TROPONIN I - Abnormal; Notable for the following:    Troponin I 0.05 (*)    All other components within normal limits  I-STAT TROPOININ, ED - Abnormal; Notable for the following:    Troponin i, poc 0.10 (*)    All other components within normal limits  I-STAT ARTERIAL BLOOD GAS, ED - Abnormal; Notable for the following:    pH, Arterial 7.196 (*)    pCO2 arterial 58.7 (*)    pO2, Arterial 280.0 (*)    Acid-base deficit 6.0 (*)    All other components within normal limits  CBC WITH DIFFERENTIAL/PLATELET    Imaging Review Dg Chest Portable 1 View  02/26/2015   CLINICAL DATA:  Respiratory distress  EXAM: PORTABLE CHEST - 1 VIEW  COMPARISON:  None.  FINDINGS: Diffuse airspace opacities are present throughout both lungs. This most likely represents alveolar edema. This could represent ARDS. Infectious infiltrates not excluded. There is no large effusion. There is no pneumothorax. There is mild cardiomegaly.  IMPRESSION: Diffuse airspace opacities, likely alveolar edema from CHF.   Electronically Signed   By: Andreas Newport M.D.   On: 02/26/2015 02:51     EKG Interpretation   Date/Time:  Wednesday February 26 2015 02:14:32 EDT Ventricular Rate:  96 PR Interval:  202 QRS Duration: 100 QT Interval:  361 QTC Calculation: 456 R Axis:   109 Text Interpretation:  Sinus rhythm RAE, consider biatrial enlargement  Consider left ventricular hypertrophy Lateral infarct, age indeterminate  Anterior ST elevation, probably due to LVH Baseline wander in lead(s) II  III aVF  No dynamic changes Confirmed by HORTON  MD, [place] ([id number]) on  02/26/2015 3:46:59 AM      MDM   Final diagnoses:  Acute respiratory distress  CHF exacerbation   Patient presents in acute respiratory distress. History of CHF. Patient is from [place] and we have no other records available. Was initially tachycardic in the 140s and on C Pap. When he transitioned to our BiPAP machine, he appeared much more comfortable. Denies any chest pain. Initial EKG with LVH and minimal ST elevations anteriorly with ST depressions laterally. I felt the changes were most likely related to LVH and rate. Patient was given IV metoprolol and Ativan. Rate improved to 105. Repeat EKG without any dynamic changes. He does have anterior ST elevation but the morphology is consistent with LVH and repolarization. Discussed with on-call cardiology, Dr. Aundra Dubin who agrees. Continues to be chest pain-free. Patient was  placed on IV nitroglycerin with rapid improvement of his symptoms. Portable chest x-ray shows diffuse airspace opacities consistent with alveolar edema and CHF.  Lab work is notable for creatinine of 1.56. Initial troponin is 0.1 and lab troponin is 0.05.  Discussed again with Dr. Aundra Dubin.  He feels the troponin leak is likely secondary to demand given patient's heart rate and presentation. We'll plan to admit to the step down unit under the hospitalist for further workup and management.  I personally performed the services described in this documentation, which was scribed in my presence. The recorded information has been reviewed and is accurate.      Merryl Hacker, MD 02/26/15 [id number]  Merryl Hacker, MD 02/26/15 [phone]

## 2015-02-26 NOTE — H&P (Addendum)
[facility] History and Physical  [name] [id number] DOB: 1941-08-18 DOA: 02/26/2015  Referring physician: ED physician PCP: No PCP Per Patient  Specialists:   Chief Complaint: SOB  HPI: [name] is a 74 y.o. male with PMH of hypertension, hyperlipidemia, diabetes mellitus, CHF, CKD (unclear stage), solitary kidney, who presents with shortness breath.  Patient is from [place], comes here for visiting his family. Patient reports that he started having shortness of breath at about 11:30 last night. He has mild cough, without sputum production. On EMS arrival, patient's oxygen saturation at 70%; he was placed on CPAP at that time, improving spO2 until patient removed CPAP. SpO2 dropped back down to 72% and CPAP was replaced en route, improving spO2 to 88% again. He was given 125mg  solumedrol and mag sulfate 2g en route. Currently, patient dose not have chest pain, fever or chills. No leg edema, abdominal pain, diarrhea, symptoms for UTI, unilateral weakness. Tenderness over calf areas. Patient was treated with 40 mg IV Lasix, metoprolol and nitroglycerin drip in emergency room, with significant improvement of his symptoms.  In ED, patient was found to have troponin 0.05, BNP 405.5, WBC 9.5, temperature normal, tachycardia, Cre=1.56 and BUN 21. Chest x-ray is consistent with CHF exacerbation. ABG showed pH 7.196, PCO2 58.7, PO2 280. Patient is admitted to inpatient for further evaluation and treatment.  Where does patient live?   At home    Can patient participate in ADLs? Some   Review of Systems:   General: no fevers, chills, has fatigue HEENT: no blurry vision, hearing changes or sore throat Pulm: has dyspnea, coughing, wheezing CV: no chest pain, palpitations Abd: no nausea, vomiting, abdominal pain, diarrhea, constipation GU: no dysuria, burning on urination, increased urinary frequency, hematuria  Ext: no leg edema Neuro: no unilateral weakness, numbness,  or tingling, no vision change or hearing loss Skin: no rash MSK: No muscle spasm, no deformity, no limitation of range of movement in spin Heme: No easy bruising.  Travel history: No recent long distant travel.  Allergy: No Known Allergies  Past Medical History  Diagnosis Date  . CHF (congestive heart failure)   . Hypertension   . Hyperlipidemia   . Diabetes mellitus without complication   . Solitary kidney     History reviewed. No pertinent past surgical history.  Social History:  reports that he quit smoking about 7 years ago. His smoking use included Cigarettes. He does not have any smokeless tobacco history on file. He reports that he drinks alcohol. He reports that he does not use illicit drugs.  Family History: Patient does not know family medical history in detail, but reports that his father was smoker  Prior to Admission medications   Not on File    Physical Exam: Filed Vitals:   02/26/15 0330 02/26/15 0345 02/26/15 0400 02/26/15 0402  BP: 136/73 139/80 174/105   Pulse: 89 92 92 93  Temp:      TempSrc:      Resp: 21 19 20 25   Height:      Weight:      SpO2: 100% 100% 96% 100%   General: Not in acute distress HEENT:       Eyes: PERRL, EOMI, no scleral icterus.       ENT: No discharge from the ears and nose, no pharynx injection, no tonsillar enlargement.        Neck: positive JVD, no bruit, no mass felt. Heme: No neck lymph node enlargement. Cardiac: S1/S2, RRR, No murmurs,  No gallops or rubs. Pulm: has rales and wheezing laterally, no rubs. Abd: Soft, nondistended, nontender, no rebound pain, no organomegaly, BS present. Ext: No pitting leg edema bilaterally. 2+DP/PT pulse bilaterally. Musculoskeletal: No joint deformities, No joint redness or warmth, no limitation of ROM in spin. Skin: No rashes.  Neuro: Alert, oriented X3, cranial nerves II-XII grossly intact, muscle strength 5/5 in all extremities, sensation to light touch intact.  Psych: Patient is not  psychotic, no suicidal or hemocidal ideation.  Labs on Admission:  Basic Metabolic Panel:  Recent Labs Lab 02/26/15 0202  NA 138  K 3.8  CL 108  CO2 20*  GLUCOSE 242*  BUN 21*  CREATININE 1.56*  CALCIUM 8.5*   Liver Function Tests:  Recent Labs Lab 02/26/15 0202  AST 34  ALT 24  ALKPHOS 56  BILITOT 0.7  PROT 8.3*  ALBUMIN 3.8   No results for input(s): LIPASE, AMYLASE in the last 168 hours. No results for input(s): AMMONIA in the last 168 hours. CBC:  Recent Labs Lab 02/26/15 0202  WBC 9.5  NEUTROABS 5.1  HGB 13.7  HCT 43.2  MCV 96.4  PLT 253   Cardiac Enzymes:  Recent Labs Lab 02/26/15 0202  TROPONINI 0.05*    BNP (last 3 results)  Recent Labs  02/26/15 0202  BNP 405.5*    ProBNP (last 3 results) No results for input(s): PROBNP in the last 8760 hours.  CBG: No results for input(s): GLUCAP in the last 168 hours.  Radiological Exams on Admission: Dg Chest Portable 1 View  02/26/2015   CLINICAL DATA:  Respiratory distress  EXAM: PORTABLE CHEST - 1 VIEW  COMPARISON:  None.  FINDINGS: Diffuse airspace opacities are present throughout both lungs. This most likely represents alveolar edema. This could represent ARDS. Infectious infiltrates not excluded. There is no large effusion. There is no pneumothorax. There is mild cardiomegaly.  IMPRESSION: Diffuse airspace opacities, likely alveolar edema from CHF.   Electronically Signed   By: Andreas Newport M.D.   On: 02/26/2015 02:51    EKG: Independently reviewed.  Abnormal findings: LVH, T-wave inversion in inferior leads and in V4 to V6. No old ekg to compare with.  Assessment/Plan Principal Problem:   CHF exacerbation Active Problems:   Hypertension   Hyperlipidemia   Diabetes mellitus without complication   Acute respiratory distress   Alcohol abuse   Acute respiratory distress and CHF exacerbation: Patient has history of CHF, but not sure which type of CHF since we do not have 2-D echo on  record. Patient's presentation are consistent with CHF exacerbation given elevated BNP and pulmonary edema on chest x-ray. Patient does not have any leg edema, but has elevated blood pressure and positive JVD, indicating flash of pulmonary edema. Unlikely to have pneumonia given no chest pain, fever or leukocytosis. The potential differential diagnosis is pulmonary embolism given patient had long distance traveling, but the patient does not have any chest pain and his signs of DVT. Another differential diagnosis is bronchitis or COPD. Patient does not have history of COPD, but he was is smoker before. He has wheezing on lung auscultation.  -will admit to SDU -will treat with IV lasix 40 mg qdaily  -ASA , coreg -will cycle CE X3 -will check TSH, A1c, FLP -will get 2-D echo to evaluate EF -will get EKG -strict In/Out -Daily body weight. -NPO now -No on ACEI because of CKD -switch NTG gtt to patch - DuoNeb when necessary for wheezing -Solu-Medrol 40 mg daily -  Mucinex when necessary for cough  Elevated trop: He does not have any chest pain, but has T-wave inversion. No old EKG to compare with. Dr. Dina Rich discussed with cardiology, Dr. Marigene Ehlers who thinks this is most likely due to CHF exacerbation rather than ACS. - Aspirin, Coreg -Troponin 3 - follow up 2d echo  Hypertension: Initial blood pressure was 196/102, which improved to 166/94 after brief treatment with IV Lasix, metoprolol and nitroglycerin drip in the emergency room. 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 Patient is taking glipizide at home. CBG=242. -SSI -Check A1c  Alcohol abuse: He drinks every other day, 3 glasses of wine or liquor each time -Did counseling about the importance of quitting drinking -CIWA protocol  CKD: Not sure what stage  of CKD. Cre=1.56 and BUN 21. Patient has solitary kidney. He states that whenever his kidney is not functioning with unclear etiology. -check FeUrea  -US-renal -follow up renal Fx by BMP  DVT ppx: SQ Heparin      Code Status: Full code Family Communication: None at bed side.    Disposition Plan: Admit to inpatient   Date of Service 02/26/2015    Ivor Costa [facility] Pager [phone]  If 7PM-7AM, please contact night-coverage [email or web address] Password [facility] 02/26/2015, 4:48 AM

## 2015-02-26 NOTE — Progress Notes (Signed)
Decreased inspiratory and expiratory pressure, due to pt becoming more awake/aware. Pt tolerating well at this time. RT will continue to monitor

## 2015-02-26 NOTE — ED Notes (Addendum)
Per EMS - pt comes from home with new onset respiratory distress. Upon EMS arrival, pt SpO2 70%, pt placed on CPAP, which improved O2 saturation until pt took CPAP off. SpO2 dropped back to 72% and CPAP placed back on en route, improved O2 saturation to 88%.  125mg  Solumedrol, 2g MgSO4, and albuterol given by EMS.  Pt states he has CHF.

## 2015-02-27 ENCOUNTER — Inpatient Hospital Stay (HOSPITAL_COMMUNITY): Payer: 59

## 2015-02-27 DIAGNOSIS — I429 Cardiomyopathy, unspecified: Secondary | ICD-10-CM

## 2015-02-27 DIAGNOSIS — I5023 Acute on chronic systolic (congestive) heart failure: Principal | ICD-10-CM

## 2015-02-27 LAB — GLUCOSE, CAPILLARY
Glucose-Capillary: 153 mg/dL — ABNORMAL HIGH (ref 65–99)
Glucose-Capillary: 116 mg/dL — ABNORMAL HIGH (ref 65–99)

## 2015-02-27 LAB — BASIC METABOLIC PANEL
Anion gap: 11 (ref 5–15)
BUN: 35 mg/dL — ABNORMAL HIGH (ref 6–20)
CO2: 24 mmol/L (ref 22–32)
Calcium: 9 mg/dL (ref 8.9–10.3)
Chloride: 101 mmol/L (ref 101–111)
Creatinine, Ser: 1.77 mg/dL — ABNORMAL HIGH (ref 0.61–1.24)
GFR calc Af Amer: 43 mL/min — ABNORMAL LOW (ref >60)
GFR calc non Af Amer: 37 mL/min — ABNORMAL LOW (ref >60)
Glucose, Bld: 162 mg/dL — ABNORMAL HIGH (ref 65–99)
Potassium: 3.9 mmol/L (ref 3.5–5.1)
Sodium: 136 mmol/L (ref 135–145)

## 2015-02-27 LAB — HEMOGLOBIN A1C
Hgb A1c MFr Bld: 5.6 % (ref 4.8–5.6)
Mean Plasma Glucose: 114 mg/dL

## 2015-02-27 MED ORDER — ADULT MULTIVITAMIN W/MINERALS CH: 1.0000 | ORAL_TABLET | Freq: Every day | ORAL | Status: DC

## 2015-02-27 MED ORDER — FOLIC ACID 1 MG PO TABS: 1.0000 mg | ORAL_TABLET | Freq: Every day | ORAL | Status: DC

## 2015-02-27 MED ORDER — THIAMINE HCL 100 MG PO TABS: 100.0000 mg | ORAL_TABLET | Freq: Every day | ORAL | Status: DC

## 2015-02-27 MED ORDER — ASPIRIN EC 81 MG PO TBEC
81.0000 mg | DELAYED_RELEASE_TABLET | Freq: Every day | ORAL | Status: DC
  Administered 2015-02-27: 81 mg via ORAL
  Filled 2015-02-27: qty 1

## 2015-02-27 MED ORDER — FUROSEMIDE 20 MG PO TABS: 20.0000 mg | ORAL_TABLET | Freq: Every day | ORAL | Status: DC

## 2015-02-27 MED ORDER — FUROSEMIDE 20 MG PO TABS
20.0000 mg | ORAL_TABLET | Freq: Every day | ORAL | Status: DC
  Administered 2015-02-27: 20 mg via ORAL
  Filled 2015-02-27: qty 1

## 2015-02-27 MED ORDER — LISINOPRIL 10 MG PO TABS
10.0000 mg | ORAL_TABLET | Freq: Every day | ORAL | Status: DC
  Administered 2015-02-27: 10 mg via ORAL
  Filled 2015-02-27: qty 1

## 2015-02-27 MED ORDER — LISINOPRIL 10 MG PO TABS: 10.0000 mg | ORAL_TABLET | Freq: Every day | ORAL | Status: DC

## 2015-02-27 MED ORDER — ERGOCALCIFEROL 1.25 MG (50000 UT) PO CAPS
50000.0000 [IU] | ORAL_CAPSULE | ORAL | Status: DC
Start: 2015-02-27 — End: 2015-02-27
  Administered 2015-02-27: 50000 [IU] via ORAL
  Filled 2015-02-27: qty 1

## 2015-02-27 NOTE — Progress Notes (Signed)
DC instructions given to patient. Questions answered. VSS. eICU and CCMD notified of DC. PIV DC, hemostasis achieved. Delayed DC d/t pt awaiting transportation from sister. Dr. appointments set up for PCP and Cardiology in [place], [place]. Will continue to monitor until pt DC by private vehicle. All belongings sent home with patient.

## 2015-02-27 NOTE — Progress Notes (Signed)
PT Cancellation Note  Patient Details Name: Xzorion Laur MRN: [id number] DOB: 1940-10-14   Cancelled Treatment:    Reason Eval/Treat Not Completed: Medical issues which prohibited therapy.  Pt with elevated Troponins.  Wil hold PT and mobility until Troponins trending down.     [place], Thornton Papas 02/27/2015, 9:52 AM

## 2015-02-27 NOTE — Discharge Summary (Signed)
Physician Discharge Summary  [name] [id number] DOB: 12-10-1940 DOA: 02/26/2015  PCP: No PCP Per Patient  Admit date: 02/26/2015 Discharge date: 02/27/2015  Time spent: Greater than 30 minutes  Recommendations for Outpatient Follow-up:  1. PCP in [place] in 5 days with repeat labs (CBC & BMP). 2. Primary cardiologist in [place] in one week  Discharge Diagnoses:  Principal Problem:   CHF exacerbation Active Problems:   Hypertension   Hyperlipidemia   Diabetes mellitus without complication   Acute respiratory distress   Alcohol abuse   Elevated troponin   Acute respiratory failure with hypoxia   Acute renal failure superimposed on stage 3 chronic kidney disease   Demand ischemia   AKI (acute kidney injury)   Congestive heart disease   Essential hypertension   Discharge Condition: Improved & Stable  Diet recommendation: Heart healthy and diabetic diet.  Filed Weights   02/26/15 0152 02/26/15 0635 02/27/15 1000  Weight: 98.884 kg (218 lb) 96.6 kg (212 lb 15.4 oz) 95.9 kg (211 lb 6.7 oz)    History of present illness:  74 year old male, spends his time between [place], PCP/cardiologist/pulmonologist in [place] area, PMH of HTN, HLD, DM 2, chronic systolic CHF, CKD, states that he has not been on diuretics for one year because he did not follow-up with his cardiologist, presented with 3 week history of intermittent dyspnea which progressively got worse on night of admission. On EMS arrival, patient's oxygen saturation at 70%; he was placed on CPAP at that time, improving spO2 until patient removed CPAP. SpO2 dropped back down to 72% and CPAP was replaced en route, improving spO2 to 88% again. He was given 125mg  solumedrol and mag sulfate 2g en route. Patient was treated with 40 mg IV Lasix, metoprolol and nitroglycerin drip in emergency room, with significant improvement of his symptoms.  In ED, patient was found to have troponin 0.05, BNP 405.5, WBC 9.5,  temperature normal, tachycardia, Cre=1.56 and BUN 21. Chest x-ray is consistent with CHF exacerbation. ABG showed pH 7.196, PCO2 58.7, PO2 280. Patient is admitted to inpatient for further evaluation and treatment.  Hospital Course:   Acute on chronic systolic CHF - Admitted to stepdown unit and started on IV Lasix and IV NTG drip - Clinically improved. - Treated with IV Lasix: note creatinine has bumped from 1.56>1.72 on 6/29 (baseline creatinine not known). Creatinine has remained stable at 1.7 - Continue aspirin and Coreg.  - Patient on IV NTG at 5 mcg-DC'ed. - Follow 2-D echo results-as below (LVEF 25-30 percent with diffuse hypokinesis). - Mild troponin elevation likely secondary to demand ischemia from acute respiratory failure, CHF exacerbation and chronic kidney disease. - Cardiology input appreciated - Reviewed medical records from his cardiologist/hospitalization in 2013-no recent once available. Creatinine at that time was 1.6, hemoglobin 10.8. 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 EF February 2013: 21%. On medical treatment, subsequent EF in June 2013: 40-45 percent. Nuclear stress test 10/25/11 showed LVEF 19%, dilated LV with global hypokinesis. At that time patient was on aspirin 81 MG daily, Toprol-XL 50 MG daily, Lasix 20 MG daily, lisinopril 40 MG daily, vitamin D 50,000 units weekly and fenofibrate 145 MG daily - There seems to be some concern regarding medication noncompliance. Patient was counseled extensively regarding importance of compliance with medical regimen and he verbalizes understanding. - Patient has clinically improved. Changed Lasix to oral 20 MG daily. Discontinued newly started amlodipine and started low-dose lisinopril at 10 MG daily.  Patient's current creatinine is probably at baseline. Patient was offered cardiac cath by cardiology to exclude CAD but patient declined and states that he  wishes to have this done or followed up by his doctors in [place]. Discussed with cardiology/Dr. Marlou Porch who has cleared him for discharge home. - Patient states that he had an appointment to see his PCP today but plans to return to the [place] area in the next couple of days. It has been stressed on him to follow-up with his doctors in the next 5-7 days with repeat labs. His BMP will need close monitoring while lisinopril has been newly started. Will attempt to arrange follow-up appointments if possible. - Patient has also been advised to make sure that he signs the necessary medical releases and have his doctors in the [place] area and get his medical records from this hospitalization and he verbalizes understanding. - Chest x-ray shows near complete resolution of CHF - TSH: 1.106  Elevated troponin - Likely from demand ischemia. No reported chest pain. - Troponin trend: 0.10 > 0.05 > 0.11 - Continue aspirin and Coreg. Lisinopril initiated. This can be further titrated up as outpatient as blood pressure and renal functions allow. - Follow 2-D echo: Results as below. - Cardiology follow-up appreciated.  Acute respiratory failure with hypoxia - Most likely from CHF exacerbation. - Although COPD exacerbation, PNA and PE are on differentials, due to patient's history/presentation and response to diuresis, felt to be less likely. - Resolved.  Essential Hypertension - Initial blood pressure was 196/102, which improved to 166/94 after brief treatment with IV Lasix, metoprolol and nitroglycerin drip in the emergency room. -Continue home Coreg - Resumed lisinopril at low-dose which can be titrated up as outpatient. - Improved blood pressure control.  Hyperlipidemia - LDL 135 -continue Zocor  DM-II - Patient is taking glipizide at home. - A1c: 5.6 suggesting good outpatient control. - No reported hypoglycemic symptoms but needs to be monitored closely due to chronic kidney disease.  Alcohol abuse - He  drinks every other day, 3 glasses of wine or liquor each time -Did counseling about the importance of quitting drinking -CIWA protocol - No overt withdrawal  CKD, possibly stage III - Admitted with creatinine of 1.56. Baseline creatinine not known. - Creatinine has jumped overnight to 1.72. Likely due to diuresis. - Requested records from PCP to see what his baseline creatinine was - Reviewed records from 2013: Creatinine then was 1.6 suggesting current creatinine may be his baseline. - Close outpatient follow-up. 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 However renal ultrasound shows presence of both kidneys and normal renal ultrasound.  Vitamin D deficiency - Continue supplements    Consultants:  Cardiology  Procedures:  2-D echo 02/26/15: Study Conclusions  - Left ventricle: The cavity size was moderately dilated. Systolic function was severely reduced. The estimated ejection fraction was in the range of 25% to 30%. Diffuse hypokinesis. Doppler parameters are consistent with abnormal left ventricular relaxation (grade 1 diastolic dysfunction). Doppler parameters are consistent with elevated ventricular end-diastolic filling pressure. - Ventricular septum: Septal motion showed paradox. - Aortic valve: Trileaflet; normal thickness leaflets. There was trivial regurgitation. - Aortic root: The aortic root was normal in size. - Mitral valve: Structurally normal valve. There was mild regurgitation. - Left atrium: The atrium was moderately dilated. - Right ventricle: Systolic function was normal. - Right atrium: The atrium was normal in size. - Pulmonic valve: There was trivial regurgitation. - Pulmonary arteries: The main pulmonary  artery was normal-sized. Systolic pressure was within the normal range. - Inferior vena cava: The vessel was normal in size. - Pericardium, extracardiac: There was no pericardial  effusion.  Impressions:  - Moderately dilated left ventricle with severely impaired systolic function and diffuse hypokinesis suggestive of non-ischemic cardiomyopathy.  Discharge Exam:  Complaints: Denies complaints. No chest pain, dyspnea or palpitations.  Filed Vitals:   02/27/15 0430 02/27/15 0700 02/27/15 1000 02/27/15 1030  BP: 120/62     Pulse:      Temp:  97.7 F (36.5 C)  98.5 F (36.9 C)  TempSrc:  Oral  Oral  Resp: 16     Height:      Weight:   95.9 kg (211 lb 6.7 oz)   SpO2:       oxygen saturation 93% on room air.  General exam: Pleasant elderly male lying comfortably supine in bed Respiratory system: clear to auscultatio. No increased work of breathing. Cardiovascular system: S1 & S2 heard, RRR. No JVD, murmurs, gallops, clicks or pedal edema. Telemetry: sinus rhythm. Gastrointestinal system: Abdomen is nondistended, soft and nontender. Normal bowel sounds heard. Central nervous system: Alert and oriented. No focal neurological deficits. Extremities: Symmetric 5 x 5 power.  Discharge Instructions      Discharge Instructions    (HEART FAILURE PATIENTS) Call MD:  Anytime you have any of the following symptoms: 1) 3 pound weight gain in 24 hours or 5 pounds in 1 week 2) shortness of breath, with or without a dry hacking cough 3) swelling in the hands, feet or stomach 4) if you have to sleep on extra pillows at night in order to breathe.    Complete by:  As directed      Call MD for:  difficulty breathing, headache or visual disturbances    Complete by:  As directed      Call MD for:  extreme fatigue    Complete by:  As directed      Call MD for:  persistant dizziness or light-headedness    Complete by:  As directed      Call MD for:  severe uncontrolled pain    Complete by:  As directed      Diet - low sodium heart healthy    Complete by:  As directed      Diet Carb Modified    Complete by:  As directed      Increase activity slowly    Complete by:   As directed             Medication List    TAKE these medications        aspirin EC 81 MG tablet  Take 81 mg by mouth daily.     carvedilol 6.25 MG tablet  Commonly known as:  COREG  Take 6.25 mg by mouth 2 (two) times daily with a meal.     ergocalciferol 50000 UNITS capsule  Commonly known as:  VITAMIN D2  Take 50,000 Units by mouth once a week. On Wednesdays     ferrous sulfate 325 (65 FE) MG tablet  Take 325 mg by mouth at bedtime.     folic acid 1 MG tablet  Commonly known as:  FOLVITE  Take 1 tablet (1 mg total) by mouth daily.     furosemide 20 MG tablet  Commonly known as:  LASIX  Take 1 tablet (20 mg total) by mouth daily.     glipiZIDE 5 MG 24 hr tablet  Commonly known  as:  GLUCOTROL XL  Take 5 mg by mouth daily with breakfast.     lisinopril 10 MG tablet  Commonly known as:  PRINIVIL,ZESTRIL  Take 1 tablet (10 mg total) by mouth daily.     multivitamin with minerals Tabs tablet  Take 1 tablet by mouth daily.     simvastatin 40 MG tablet  Commonly known as:  ZOCOR  Take 40 mg by mouth at bedtime.     thiamine 100 MG tablet  Take 1 tablet (100 mg total) by mouth daily.       Follow-up Information    Follow up with Primary Medical Doctor in [place]. Schedule an appointment as soon as possible for a visit in 5 days.   Why:  to be seen with repeat labs (CBC & BMP). You will need to have your current hospitalization records sent over to your doctors in [place].      Follow up with Primary Cardiologist in [place]. Schedule an appointment as soon as possible for a visit in 1 week.       The results of significant diagnostics from this hospitalization (including imaging, microbiology, ancillary and laboratory) are listed below for reference.    Significant Diagnostic Studies: Dg Chest 2 View  02/27/2015   CLINICAL DATA:  Shortness of breath, CHF, hypertension, hyperlipidemia, diabetes mellitus  EXAM: CHEST  2 VIEW  COMPARISON:  02/26/2015  FINDINGS:  Enlargement of cardiac silhouette.  Mediastinal contours and pulmonary vascularity normal.  Near complete resolution of pulmonary edema seen on previous exam.  Central peribronchial thickening.  No pleural effusion or pneumothorax.  Bones unremarkable.  IMPRESSION: Enlargement of cardiac silhouette.  Near complete resolution of pulmonary edema since previous exam.   Electronically Signed   By: Lavonia Dana M.D.   On: 02/27/2015 08:39   US Renal  02/26/2015   CLINICAL DATA:  Acute kidney injury.  Hypertension and diabetes.  EXAM: RENAL / URINARY TRACT ULTRASOUND COMPLETE  COMPARISON:  None.  FINDINGS: Right Kidney:  Length: 12.3 cm. Echogenicity within normal limits. No mass or hydronephrosis visualized.  Left Kidney:  Length: 12.3 cm. Echogenicity within normal limits. No mass or hydronephrosis visualized.  Bladder:  Normal for degree of bladder distention.  Multiple echogenic stones in the gallbladder.  IMPRESSION: Normal renal ultrasound.  Cholelithiasis.   Electronically Signed   By: Markus Daft M.D.   On: 02/26/2015 15:55   Dg Chest Portable 1 View  02/26/2015   CLINICAL DATA:  Respiratory distress  EXAM: PORTABLE CHEST - 1 VIEW  COMPARISON:  None.  FINDINGS: Diffuse airspace opacities are present throughout both lungs. This most likely represents alveolar edema. This could represent ARDS. Infectious infiltrates not excluded. There is no large effusion. There is no pneumothorax. There is mild cardiomegaly.  IMPRESSION: Diffuse airspace opacities, likely alveolar edema from CHF.   Electronically Signed   By: Andreas Newport M.D.   On: 02/26/2015 02:51    Microbiology: Recent Results (from the past 240 hour(s))  MRSA PCR Screening     Status: None   Collection Time: 02/26/15  6:50 AM  Result Value Ref Range Status   MRSA by PCR NEGATIVE NEGATIVE Final    Comment:        The GeneXpert MRSA Assay (FDA approved for NASAL specimens only), is one component of a comprehensive MRSA  colonization surveillance program. It is not intended to diagnose MRSA infection nor to guide or monitor treatment for MRSA infections.      Labs: Basic  Metabolic Panel:  Recent Labs Lab 02/26/15 0202 02/26/15 0711 02/27/15 0255  NA 138  --  136  K 3.8  --  3.9  CL 108  --  101  CO2 20*  --  24  GLUCOSE 242*  --  162*  BUN 21*  --  35*  CREATININE 1.56* 1.72* 1.77*  CALCIUM 8.5*  --  9.0  MG  --  2.4  --    Liver Function Tests:  Recent Labs Lab 02/26/15 0202  AST 34  ALT 24  ALKPHOS 56  BILITOT 0.7  PROT 8.3*  ALBUMIN 3.8   No results for input(s): LIPASE, AMYLASE in the last 168 hours. No results for input(s): AMMONIA in the last 168 hours. CBC:  Recent Labs Lab 02/26/15 0202 02/26/15 0711  WBC 9.5 10.4  NEUTROABS 5.1  --   HGB 13.7 13.6  HCT 43.2 42.3  MCV 96.4 95.9  PLT 253 219   Cardiac Enzymes:  Recent Labs Lab 02/26/15 0202 02/26/15 0711 02/26/15 1200 02/26/15 1905  TROPONINI 0.05* 0.11* 0.09* 0.09*   BNP: BNP (last 3 results)  Recent Labs  02/26/15 0202  BNP 405.5*    ProBNP (last 3 results) No results for input(s): PROBNP in the last 8760 hours.  CBG:  Recent Labs Lab 02/26/15 0751 02/26/15 1146 02/26/15 1811 02/26/15 2124 02/27/15 0713  GLUCAP 169* 156* 180* 161* 153*         Signed:  Vernell Leep, MD, FACP, FHM. [facility] Pager [phone]  If 7PM-7AM, please contact night-coverage [email or web address] Password [email or web address] 02/27/2015, 11:30 AM

## 2015-02-27 NOTE — Progress Notes (Signed)
OT Cancellation Note  Patient Details Name: [name] MRN: [id number] DOB: 1940/09/03   Cancelled Treatment:    Reason Eval/Treat Not Completed: Patient not medically ready - Pt with elevated troponins.  Will wait to initiate OT eval until troponins are down trending.   Darlina Rumpf [place], OTR/L [id number]  02/27/2015, 9:44 AM

## 2015-02-27 NOTE — Progress Notes (Addendum)
Subjective:  No SOB, no CP  Objective:  Vital Signs in the last 24 hours: Temp:  [97.6 F (36.4 C)-98.2 F (36.8 C)] 97.6 F (36.4 C) (06/30 0426) Pulse Rate:  [71-82] 71 (06/30 0426) Resp:  [16-21] 16 (06/30 0430) BP: (119-144)/(60-75) 120/62 mmHg (06/30 0430) SpO2:  [93 %-95 %] 93 % (06/30 0426) Weight:  [211 lb 6.7 oz (95.9 kg)] 211 lb 6.7 oz (95.9 kg) (06/30 1000)  Intake/Output from previous day: 06/29 0701 - 06/30 0700 In: 480 [P.O.:480] Out: 2350 [Urine:2350]   Physical Exam: General: Well developed, well nourished, in no acute distress. Head:  Normocephalic and atraumatic. Lungs: Clear to auscultation and percussion. Mild crackles at right base Heart: Normal S1 and S2.  No murmur, rubs or gallops.  Abdomen: soft, non-tender, positive bowel sounds. Extremities: No clubbing or cyanosis. No edema. Neurologic: Alert and oriented x 3.    Lab Results:  Recent Labs  02/26/15 0202 02/26/15 0711  WBC 9.5 10.4  HGB 13.7 13.6  PLT 253 219    Recent Labs  02/26/15 0202 02/26/15 0711 02/27/15 0255  NA 138  --  136  K 3.8  --  3.9  CL 108  --  101  CO2 20*  --  24  GLUCOSE 242*  --  162*  BUN 21*  --  35*  CREATININE 1.56* 1.72* 1.77*    Recent Labs  02/26/15 1200 02/26/15 1905  TROPONINI 0.09* 0.09*   Hepatic Function Panel  Recent Labs  02/26/15 0202  PROT 8.3*  ALBUMIN 3.8  AST 34  ALT 24  ALKPHOS 56  BILITOT 0.7    Recent Labs  02/26/15 0711  CHOL 208*   No results for input(s): PROTIME in the last 72 hours.  Imaging: Dg Chest 2 View  02/27/2015   CLINICAL DATA:  Shortness of breath, CHF, hypertension, hyperlipidemia, diabetes mellitus  EXAM: CHEST  2 VIEW  COMPARISON:  02/26/2015  FINDINGS: Enlargement of cardiac silhouette.  Mediastinal contours and pulmonary vascularity normal.  Near complete resolution of pulmonary edema seen on previous exam.  Central peribronchial thickening.  No pleural effusion or pneumothorax.  Bones  unremarkable.  IMPRESSION: Enlargement of cardiac silhouette.  Near complete resolution of pulmonary edema since previous exam.   Electronically Signed   By: Lavonia Dana M.D.   On: 02/27/2015 08:39   US Renal  02/26/2015   CLINICAL DATA:  Acute kidney injury.  Hypertension and diabetes.  EXAM: RENAL / URINARY TRACT ULTRASOUND COMPLETE  COMPARISON:  None.  FINDINGS: Right Kidney:  Length: 12.3 cm. Echogenicity within normal limits. No mass or hydronephrosis visualized.  Left Kidney:  Length: 12.3 cm. Echogenicity within normal limits. No mass or hydronephrosis visualized.  Bladder:  Normal for degree of bladder distention.  Multiple echogenic stones in the gallbladder.  IMPRESSION: Normal renal ultrasound.  Cholelithiasis.   Electronically Signed   By: Markus Daft M.D.   On: 02/26/2015 15:55   Dg Chest Portable 1 View  02/26/2015   CLINICAL DATA:  Respiratory distress  EXAM: PORTABLE CHEST - 1 VIEW  COMPARISON:  None.  FINDINGS: Diffuse airspace opacities are present throughout both lungs. This most likely represents alveolar edema. This could represent ARDS. Infectious infiltrates not excluded. There is no large effusion. There is no pneumothorax. There is mild cardiomegaly.  IMPRESSION: Diffuse airspace opacities, likely alveolar edema from CHF.   Electronically Signed   By: Andreas Newport M.D.   On: 02/26/2015 02:51   Personally viewed.  Telemetry: no adverse rhythms Personally viewed.   EKG:  LVH with repol  Cardiac Studies:  EF 25%  Scheduled Meds: . aspirin EC  81 mg Oral Daily  . carvedilol  6.25 mg Oral BID WC  . dextromethorphan-guaiFENesin  1 tablet Oral BID  . ergocalciferol  50,000 Units Oral Weekly  . ferrous sulfate  325 mg Oral Q breakfast  . folic acid  1 mg Oral Daily  . furosemide  20 mg Oral Daily  . heparin  5,000 Units Subcutaneous 3 times per day  . insulin aspart  0-9 Units Subcutaneous TID WC  . lisinopril  10 mg Oral Daily  . LORazepam  0-4 mg Intravenous 4 times  per day   Followed by  . [START ON 02/28/2015] LORazepam  0-4 mg Intravenous Q12H  . multivitamin with minerals  1 tablet Oral Daily  . simvastatin  40 mg Oral q1800  . sodium chloride  3 mL Intravenous Q12H  . thiamine  100 mg Oral Daily   Or  . thiamine  100 mg Intravenous Daily   Continuous Infusions:  PRN Meds:.sodium chloride, acetaminophen, hydrALAZINE, ipratropium-albuterol, LORazepam **OR** LORazepam, ondansetron (ZOFRAN) IV, sodium chloride  Assessment/Plan:  Principal Problem:   CHF exacerbation Active Problems:   Hypertension   Hyperlipidemia   Diabetes mellitus without complication   Acute respiratory distress   Alcohol abuse   Elevated troponin   Acute respiratory failure with hypoxia   Acute renal failure superimposed on stage 3 chronic kidney disease   Demand ischemia   AKI (acute kidney injury)   Congestive heart disease   Essential hypertension  73 with acute systolic HF  1. EF [id number]  - does not report prior history of cath  - does not recall being told about cardiomyopathy  - Offered cath here but he wants to have this done in [place]. Seems reasonable, but expressed importance to him to have this done to exclude CAD as cause of cardiomyopathy.   - Continue Bb  - ACE-I monitor BMET as outpatient  - OK to continue lasix 20 PO QD low dose.   - Elevated troponin secondary to CHF exacerbation (demand)  OK with DC home. Appears euvolemic.    SKAINS, [place] 02/27/2015, 11:04 AM    ADDENDUM: spoke with Dr. Dellia Nims. He has a history of NICM (NUC stress no ischemia - no prior CATH), EF 25%. Creat 1.6.   Candee Furbish, MD

## 2015-02-27 NOTE — Discharge Instructions (Signed)

## 2015-02-27 NOTE — Progress Notes (Addendum)
CM spoke with pt regarding heart failure sreen, pt stated understands heart failure. States he does watch his sodium and fluid intake ,and knows he should weigh daily, "sometimes i just get off track but i know better ". Pt informed CM he does have a scale for weighing daily. Heart failure packet @ pt's bedside, CM encouraged pt to use for reference/ reinforcement and to weigh daily.

## 2016-09-24 ENCOUNTER — Encounter (HOSPITAL_COMMUNITY): Payer: Self-pay | Admitting: Emergency Medicine

## 2016-09-24 ENCOUNTER — Emergency Department (HOSPITAL_COMMUNITY)
Admission: EM | Admit: 2016-09-24 | Discharge: 2016-09-24 | Disposition: A | Discharge status: Home / Self Care | Payer: Medicare Other | Attending: Emergency Medicine | Admitting: Emergency Medicine

## 2016-09-24 ENCOUNTER — Emergency Department (HOSPITAL_COMMUNITY): Payer: Medicare Other

## 2016-09-24 DIAGNOSIS — E1122 Type 2 diabetes mellitus with diabetic chronic kidney disease: Secondary | ICD-10-CM | POA: Insufficient documentation

## 2016-09-24 DIAGNOSIS — Z87891 Personal history of nicotine dependence: Secondary | ICD-10-CM | POA: Diagnosis not present

## 2016-09-24 DIAGNOSIS — M109 Gout, unspecified: Secondary | ICD-10-CM

## 2016-09-24 DIAGNOSIS — M79641 Pain in right hand: Secondary | ICD-10-CM | POA: Diagnosis not present

## 2016-09-24 DIAGNOSIS — I509 Heart failure, unspecified: Secondary | ICD-10-CM | POA: Diagnosis not present

## 2016-09-24 DIAGNOSIS — Z79899 Other long term (current) drug therapy: Secondary | ICD-10-CM | POA: Insufficient documentation

## 2016-09-24 DIAGNOSIS — Z7982 Long term (current) use of aspirin: Secondary | ICD-10-CM | POA: Diagnosis not present

## 2016-09-24 DIAGNOSIS — M79671 Pain in right foot: Secondary | ICD-10-CM | POA: Diagnosis not present

## 2016-09-24 DIAGNOSIS — N183 Chronic kidney disease, stage 3 (moderate): Secondary | ICD-10-CM | POA: Insufficient documentation

## 2016-09-24 DIAGNOSIS — Z7984 Long term (current) use of oral hypoglycemic drugs: Secondary | ICD-10-CM | POA: Insufficient documentation

## 2016-09-24 DIAGNOSIS — I130 Hypertensive heart and chronic kidney disease with heart failure and stage 1 through stage 4 chronic kidney disease, or unspecified chronic kidney disease: Secondary | ICD-10-CM | POA: Insufficient documentation

## 2016-09-24 DIAGNOSIS — M7989 Other specified soft tissue disorders: Secondary | ICD-10-CM | POA: Diagnosis not present

## 2016-09-24 LAB — BASIC METABOLIC PANEL
Anion gap: 9 (ref 5–15)
BUN: 26 mg/dL — ABNORMAL HIGH (ref 6–20)
CO2: 25 mmol/L (ref 22–32)
Calcium: 9.8 mg/dL (ref 8.9–10.3)
Chloride: 102 mmol/L (ref 101–111)
Creatinine, Ser: 1.4 mg/dL — ABNORMAL HIGH (ref 0.61–1.24)
GFR calc Af Amer: 56 mL/min — ABNORMAL LOW (ref >60)
GFR calc non Af Amer: 48 mL/min — ABNORMAL LOW (ref >60)
Glucose, Bld: 118 mg/dL — ABNORMAL HIGH (ref 65–99)
Potassium: 4.3 mmol/L (ref 3.5–5.1)
Sodium: 136 mmol/L (ref 135–145)

## 2016-09-24 LAB — CBC
HCT: 33.4 % — ABNORMAL LOW (ref 39.0–52.0)
Hemoglobin: 10.6 g/dL — ABNORMAL LOW (ref 13.0–17.0)
MCH: 29.7 pg (ref 26.0–34.0)
MCHC: 31.7 g/dL (ref 30.0–36.0)
MCV: 93.6 fL (ref 78.0–100.0)
Platelets: 401 10*3/uL — ABNORMAL HIGH (ref 150–400)
RBC: 3.57 MIL/uL — ABNORMAL LOW (ref 4.22–5.81)
RDW: 15.8 % — ABNORMAL HIGH (ref 11.5–15.5)
WBC: 8.1 10*3/uL (ref 4.0–10.5)

## 2016-09-24 LAB — CBG MONITORING, ED: Glucose-Capillary: 113 mg/dL — ABNORMAL HIGH (ref 65–99)

## 2016-09-24 LAB — URIC ACID: Uric Acid, Serum: 8 mg/dL — ABNORMAL HIGH (ref 4.4–7.6)

## 2016-09-24 MED ORDER — ONDANSETRON 4 MG PO TBDP
4.0000 mg | ORAL_TABLET | Freq: Once | ORAL | Status: AC
Start: 2016-09-24 — End: 2016-09-24
  Administered 2016-09-24: 4 mg via ORAL
  Filled 2016-09-24: qty 1

## 2016-09-24 MED ORDER — HYDROCODONE-ACETAMINOPHEN 5-325 MG PO TABS: 1.0000 | ORAL_TABLET | Freq: Four times a day (QID) | ORAL | 0 refills | Status: DC | PRN

## 2016-09-24 MED ORDER — HYDROCODONE-ACETAMINOPHEN 5-325 MG PO TABS
2.0000 | ORAL_TABLET | Freq: Once | ORAL | Status: AC
  Administered 2016-09-24: 2 via ORAL
  Filled 2016-09-24: qty 2

## 2016-09-24 MED ORDER — DEXAMETHASONE SODIUM PHOSPHATE 10 MG/ML IJ SOLN
10.0000 mg | Freq: Once | INTRAMUSCULAR | Status: AC
  Administered 2016-09-24: 10 mg via INTRAMUSCULAR
  Filled 2016-09-24: qty 1

## 2016-09-24 NOTE — Discharge Planning (Signed)
Troy J. Clydene Laming, RN, BSN, [name] set up appointment with Troy Cha, MD on 2/1.  Spoke with pt at bedside and advised to please arrive 15 min early and take a picture ID and your current medications.  Pt verbalizes understanding of keeping appointment.  Pt is to have MD name number from [place], [place] so new PCP can access charts.

## 2016-09-24 NOTE — Discharge Instructions (Signed)
Contact a health care provider if: You have another gout attack. You continue to have symptoms of a gout attack after10 days of treatment. You have side effects from your medicines. You have chills or a fever. You have burning pain when you urinate. You have pain in your lower back or belly. Get help right away if: You have severe or uncontrolled pain. You cannot urinate.

## 2016-09-24 NOTE — ED Notes (Signed)
ED Provider at bedside. 

## 2016-09-24 NOTE — ED Provider Notes (Signed)
[facility] DEPT Provider Note   CSN: [id number] Arrival date & time: 09/24/16  7829     History   Chief Complaint Chief Complaint  Patient presents with  . Gout    HPI [name] is a 76 y.o. male who presents with cc of gout. He has a pmh of chf, DM, HTN, HLD, single kidney, ETOH abuse , who presents with a cc of Gout. He states that he has pain in his R foot, R 2nd digit, and R elbow. He denies fevers, chills, malaise. He is out of his normal meds and does not take anything for his diabetes due to intolerance to metformin. He just moved from [place] 2 months ago and does not have a pcp. HPI  Past Medical History:  Diagnosis Date  . CHF (congestive heart failure) ([place])   . Diabetes mellitus without complication ([place])   . Hyperlipidemia   . Hypertension   . Solitary kidney     Patient Active Problem List   Diagnosis Date Noted  . Alcohol abuse 02/26/2015  . Elevated troponin 02/26/2015  . CHF exacerbation ([place])   . Hypertension   . Hyperlipidemia   . Diabetes mellitus without complication ([place])   . Acute respiratory distress   . Acute respiratory failure with hypoxia ([place])   . Acute renal failure superimposed on stage 3 chronic kidney disease ([place])   . Demand ischemia ([place])   . AKI (acute kidney injury) ([place])   . Congestive heart disease ([place])   . Essential hypertension     History reviewed. No pertinent surgical history.     Home Medications    Prior to Admission medications   Medication Sig Start Date End Date Taking? Authorizing Provider  aspirin EC 81 MG tablet Take 81 mg by mouth daily.    Historical Provider, MD  carvedilol (COREG) 6.25 MG tablet Take 6.25 mg by mouth 2 (two) times daily with a meal.    Historical Provider, MD  ergocalciferol (VITAMIN D2) 50000 UNITS capsule Take 50,000 Units by mouth once a week. On Wednesdays    Historical Provider, MD  ferrous sulfate 325 (65 FE) MG tablet Take 325 mg by mouth at bedtime.    Historical Provider,  MD  folic acid (FOLVITE) 1 MG tablet Take 1 tablet (1 mg total) by mouth daily. 02/27/15   Modena Jansky, MD  furosemide (LASIX) 20 MG tablet Take 1 tablet (20 mg total) by mouth daily. 02/27/15   Modena Jansky, MD  glipiZIDE (GLUCOTROL XL) 5 MG 24 hr tablet Take 5 mg by mouth daily with breakfast.    Historical Provider, MD  lisinopril (PRINIVIL,ZESTRIL) 10 MG tablet Take 1 tablet (10 mg total) by mouth daily. 02/27/15   Modena Jansky, MD  Multiple Vitamin (MULTIVITAMIN WITH MINERALS) TABS tablet Take 1 tablet by mouth daily. 02/27/15   Modena Jansky, MD  simvastatin (ZOCOR) 40 MG tablet Take 40 mg by mouth at bedtime.     Historical Provider, MD  thiamine 100 MG tablet Take 1 tablet (100 mg total) by mouth daily. 02/27/15   Modena Jansky, MD    Family History History reviewed. No pertinent family history.  Social History Social History  Substance Use Topics  . Smoking status: Former Smoker    Types: Cigarettes    Quit date: 02/26/2008  . Smokeless tobacco: Never Used  . Alcohol use Yes     Comment: occ     Allergies   Patient has no known allergies.  Review of Systems Review of Systems   Physical Exam Updated Vital Signs BP 173/73 (BP Location: Left Arm)   Pulse 101   Temp 98.3 F (36.8 C) (Oral)   Resp 16   Ht 5\' 11"  (1.803 m)   Wt 97.1 kg   SpO2 100%   BMI 29.85 kg/m   Physical Exam  Constitutional: He appears well-developed and well-nourished. No distress.  HENT:  Head: Normocephalic and atraumatic.  Eyes: Conjunctivae and EOM are normal. Pupils are equal, round, and reactive to light. No scleral icterus.  Neck: Normal range of motion. Neck supple.  Cardiovascular: Normal rate, regular rhythm and normal heart sounds.   Pulmonary/Chest: Effort normal and breath sounds normal. No respiratory distress.  Abdominal: Soft. There is no tenderness.  Musculoskeletal: He exhibits no edema.  Swelling, redness, tenderness of the 1st MTP, and seconf TMT joint  of the right foot with tenderness.  R  Second digit with Swelling and tenderness of the PIP. Tenderness and swelling over the R elbow.  Neurological: He is alert.  Skin: Skin is warm and dry. He is not diaphoretic.  Psychiatric: His behavior is normal.  Nursing note and vitals reviewed.    ED Treatments / Results  Labs (all labs ordered are listed, but only abnormal results are displayed) Labs Reviewed - No data to display  EKG  EKG Interpretation None       Radiology No results found.  Procedures Procedures (including critical care time)  Medications Ordered in ED Medications - No data to display   Initial Impression / Assessment and Plan / ED Course  I have reviewed the triage vital signs and the nursing notes.  Pertinent labs & imaging results that were available during my care of the patient were reviewed by me and considered in my medical decision making (see chart for details).      patient with elevated uric acid.  Blood sugars slightly elevated.  Patient given decadron. Renal insufficiency- will not give NSAIDs  case mgmt has seen the patient and set up pcp fu. I doubt any Septic joint, occult fracture or rheumatologic disorder. Patient does have PCP follow-up and is afebrile and hemodynamically stable. Seen in shared visit with Dr. Billy Fischer. Her safe for discharge at this time. Discussed return precautions  Final Clinical Impressions(s) / ED Diagnoses   Final diagnoses:  None    New Prescriptions New Prescriptions   No medications on file     Margarita Mail, PA-C 09/24/16 [place], MD 09/28/16 1700

## 2016-09-24 NOTE — ED Notes (Signed)
Patient transported to X-ray 

## 2016-09-24 NOTE — ED Triage Notes (Signed)
Pt states he has been having some swelling to right foot, right pointer finger and right elbow. Pt has hx of gout and arthritis. Denies SOB/CP.

## 2016-09-30 DIAGNOSIS — E1122 Type 2 diabetes mellitus with diabetic chronic kidney disease: Secondary | ICD-10-CM | POA: Diagnosis not present

## 2016-09-30 DIAGNOSIS — M109 Gout, unspecified: Secondary | ICD-10-CM | POA: Diagnosis not present

## 2016-09-30 DIAGNOSIS — D649 Anemia, unspecified: Secondary | ICD-10-CM | POA: Diagnosis not present

## 2016-09-30 DIAGNOSIS — I10 Essential (primary) hypertension: Secondary | ICD-10-CM | POA: Diagnosis not present

## 2016-09-30 DIAGNOSIS — R7303 Prediabetes: Secondary | ICD-10-CM | POA: Diagnosis not present

## 2016-09-30 DIAGNOSIS — I429 Cardiomyopathy, unspecified: Secondary | ICD-10-CM | POA: Diagnosis not present

## 2016-09-30 DIAGNOSIS — I509 Heart failure, unspecified: Secondary | ICD-10-CM | POA: Diagnosis not present

## 2016-09-30 DIAGNOSIS — E785 Hyperlipidemia, unspecified: Secondary | ICD-10-CM | POA: Diagnosis not present

## 2016-09-30 DIAGNOSIS — R258 Other abnormal involuntary movements: Secondary | ICD-10-CM | POA: Diagnosis not present

## 2016-09-30 DIAGNOSIS — N183 Chronic kidney disease, stage 3 (moderate): Secondary | ICD-10-CM | POA: Diagnosis not present

## 2016-10-13 DIAGNOSIS — E785 Hyperlipidemia, unspecified: Secondary | ICD-10-CM | POA: Diagnosis not present

## 2016-10-13 DIAGNOSIS — E118 Type 2 diabetes mellitus with unspecified complications: Secondary | ICD-10-CM | POA: Diagnosis not present

## 2016-10-13 DIAGNOSIS — I10 Essential (primary) hypertension: Secondary | ICD-10-CM | POA: Diagnosis not present

## 2016-10-13 DIAGNOSIS — E559 Vitamin D deficiency, unspecified: Secondary | ICD-10-CM | POA: Diagnosis not present

## 2016-10-13 DIAGNOSIS — I129 Hypertensive chronic kidney disease with stage 1 through stage 4 chronic kidney disease, or unspecified chronic kidney disease: Secondary | ICD-10-CM | POA: Diagnosis not present

## 2016-10-13 DIAGNOSIS — I429 Cardiomyopathy, unspecified: Secondary | ICD-10-CM | POA: Diagnosis not present

## 2016-10-28 ENCOUNTER — Encounter: Payer: Self-pay | Admitting: Internal Medicine

## 2016-11-04 ENCOUNTER — Ambulatory Visit: Payer: Medicare Other | Admitting: Internal Medicine

## 2016-11-11 DIAGNOSIS — I429 Cardiomyopathy, unspecified: Secondary | ICD-10-CM | POA: Diagnosis not present

## 2016-11-11 DIAGNOSIS — E118 Type 2 diabetes mellitus with unspecified complications: Secondary | ICD-10-CM | POA: Diagnosis not present

## 2016-11-11 DIAGNOSIS — I5022 Chronic systolic (congestive) heart failure: Secondary | ICD-10-CM | POA: Diagnosis not present

## 2016-11-11 DIAGNOSIS — M109 Gout, unspecified: Secondary | ICD-10-CM | POA: Diagnosis not present

## 2016-11-11 DIAGNOSIS — E785 Hyperlipidemia, unspecified: Secondary | ICD-10-CM | POA: Diagnosis not present

## 2016-11-11 DIAGNOSIS — I129 Hypertensive chronic kidney disease with stage 1 through stage 4 chronic kidney disease, or unspecified chronic kidney disease: Secondary | ICD-10-CM | POA: Diagnosis not present

## 2016-12-05 NOTE — Progress Notes (Deleted)
New Outpatient Visit Date: 12/06/2016  Referring Provider: No referring provider defined for this encounter.  Chief Complaint: ***  HPI:  [name] is a 76 y.o. male who is being seen today for the evaluation of *** at the request of No ref. provider found. he has a history of hypertension, hyperlipidemia, diabetes mellitus, CHF, and CKD with solitary kidney. He was admitted at [facility] in 01/2015 with HF exacerbation, with LVEF 25-30%. He was visiting family in the area. He elected to defer ischemia testing, as prior nuclear stress test in [place], [place] area was without evidence of ischemia.  --------------------------------------------------------------------------------------------------  Cardiovascular History & Procedures: Cardiovascular Problems:  Non-ischemic cardiomyopathy  Risk Factors:  ***  Cath/PCI:  ***  CV Surgery:  ***  EP Procedures and Devices:  ***  Non-Invasive Evaluation(s):  TTE (02/25/17): Moderately dilated LV with LVEF 25-30% and diffuse hypokinesis. Grade 1 diastolic dysfunction with elevated filling pressure. Mild AI. Moderate LA enlargement. Normal RV size and function.  Recent CV Pertinent Labs: Lab Results  Component Value Date   CHOL 208 (H) 02/26/2015   HDL 44 02/26/2015   LDLCALC 135 (H) 02/26/2015   TRIG 146 02/26/2015   CHOLHDL 4.7 02/26/2015   INR 1.11 02/26/2015   BNP 405.5 (H) 02/26/2015   K 4.3 09/24/2016   MG 2.4 02/26/2015   BUN 26 (H) 09/24/2016   CREATININE 1.40 (H) 09/24/2016    --------------------------------------------------------------------------------------------------  Past Medical History:  Diagnosis Date  . Acute renal failure superimposed on stage 3 chronic kidney disease ([place])   . Acute respiratory distress   . Acute respiratory failure with hypoxia ([place])   . AKI (acute kidney injury) ([place])   . Alcohol abuse 02/26/2015  . CHF (congestive heart failure) ([place])   . CHF exacerbation ([place])   .  Congestive heart disease ([place])   . Demand ischemia ([place])   . Diabetes mellitus without complication ([place])   . Elevated troponin 02/26/2015  . Essential hypertension   . Hyperlipidemia   . Hypertension   . Solitary kidney     No past surgical history on file.  Outpatient Encounter Prescriptions as of 12/06/2016  Medication Sig  . acetaminophen (TYLENOL) 500 MG tablet Take 1,000 mg by mouth every 6 (six) hours as needed for moderate pain or headache.  Marland Kitchen aspirin EC 81 MG tablet Take 81 mg by mouth daily.  . folic acid (FOLVITE) 1 MG tablet Take 1 tablet (1 mg total) by mouth daily. (Patient not taking: Reported on 09/24/2016)  . furosemide (LASIX) 20 MG tablet Take 1 tablet (20 mg total) by mouth daily. (Patient not taking: Reported on 09/24/2016)  . HYDROcodone-acetaminophen (NORCO) 5-325 MG tablet Take 1-2 tablets by mouth every 6 (six) hours as needed for moderate pain.  Marland Kitchen lisinopril (PRINIVIL,ZESTRIL) 10 MG tablet Take 1 tablet (10 mg total) by mouth daily. (Patient not taking: Reported on 09/24/2016)  . Multiple Vitamin (MULTIVITAMIN WITH MINERALS) TABS tablet Take 1 tablet by mouth daily. (Patient not taking: Reported on 09/24/2016)  . thiamine 100 MG tablet Take 1 tablet (100 mg total) by mouth daily. (Patient not taking: Reported on 09/24/2016)   No facility-administered encounter medications on file as of 12/06/2016.     Allergies: Patient has no known allergies.  Social History   Social History  . Marital status: Married    Spouse name: N/A  . Number of children: N/A  . Years of education: N/A   Occupational History  . Not on file.  Social History Main Topics  . Smoking status: Former Smoker    Types: Cigarettes    Quit date: 02/26/2008  . Smokeless tobacco: Never Used  . Alcohol use Yes     Comment: occ  . Drug use: No  . Sexual activity: Not on file   Other Topics Concern  . Not on file   Social History Narrative  . No narrative on file    No family history on  file.  Review of Systems: A 12-system review of systems was performed and was negative except as noted in the HPI.  --------------------------------------------------------------------------------------------------  Physical Exam: There were no vitals taken for this visit.  General:  *** HEENT: No conjunctival pallor or scleral icterus.  Moist mucous membranes.  OP clear. Neck: Supple without lymphadenopathy, thyromegaly, JVD, or HJR.  No carotid bruit. Lungs: Normal work of breathing.  Clear to auscultation bilaterally without wheezes or crackles. Heart: Regular rate and rhythm without murmurs, rubs, or gallops.  Non-displaced PMI. Abd: Bowel sounds present.  Soft, NT/ND without hepatosplenomegaly Ext: No lower extremity edema.  Radial, PT, and DP pulses are 2+ bilaterally Skin: warm and dry without rash Neuro: CNIII-XII intact.  Strength and fine-touch sensation intact in upper and lower extremities bilaterally. Psych: Normal mood and affect.  EKG:  ***  Lab Results  Component Value Date   WBC 8.1 09/24/2016   HGB 10.6 (L) 09/24/2016   HCT 33.4 (L) 09/24/2016   MCV 93.6 09/24/2016   PLT 401 (H) 09/24/2016    Lab Results  Component Value Date   NA 136 09/24/2016   K 4.3 09/24/2016   CL 102 09/24/2016   CO2 25 09/24/2016   BUN 26 (H) 09/24/2016   CREATININE 1.40 (H) 09/24/2016   GLUCOSE 118 (H) 09/24/2016   ALT 24 02/26/2015    Lab Results  Component Value Date   CHOL 208 (H) 02/26/2015   HDL 44 02/26/2015   LDLCALC 135 (H) 02/26/2015   TRIG 146 02/26/2015   CHOLHDL 4.7 02/26/2015     --------------------------------------------------------------------------------------------------  ASSESSMENT AND PLAN: Nelva Bush, MD 12/05/2016 4:06 PM

## 2016-12-06 ENCOUNTER — Ambulatory Visit: Payer: Medicare Other | Admitting: Internal Medicine

## 2016-12-15 ENCOUNTER — Encounter: Payer: Self-pay | Admitting: Internal Medicine

## 2017-11-23 ENCOUNTER — Other Ambulatory Visit: Payer: Self-pay

## 2017-11-23 ENCOUNTER — Inpatient Hospital Stay (HOSPITAL_COMMUNITY)
Admission: EM | Admit: 2017-11-23 | Discharge: 2017-11-26 | DRG: 872 | Disposition: A | Discharge status: Home / Self Care | Payer: Medicare Other | Attending: Internal Medicine | Admitting: Internal Medicine

## 2017-11-23 ENCOUNTER — Inpatient Hospital Stay (HOSPITAL_COMMUNITY): Payer: Medicare Other

## 2017-11-23 ENCOUNTER — Emergency Department (HOSPITAL_COMMUNITY): Payer: Medicare Other

## 2017-11-23 ENCOUNTER — Encounter (HOSPITAL_COMMUNITY): Payer: Self-pay

## 2017-11-23 DIAGNOSIS — I130 Hypertensive heart and chronic kidney disease with heart failure and stage 1 through stage 4 chronic kidney disease, or unspecified chronic kidney disease: Secondary | ICD-10-CM | POA: Diagnosis present

## 2017-11-23 DIAGNOSIS — K819 Cholecystitis, unspecified: Secondary | ICD-10-CM | POA: Diagnosis not present

## 2017-11-23 DIAGNOSIS — Q600 Renal agenesis, unilateral: Secondary | ICD-10-CM

## 2017-11-23 DIAGNOSIS — I10 Essential (primary) hypertension: Secondary | ICD-10-CM | POA: Diagnosis present

## 2017-11-23 DIAGNOSIS — Z79899 Other long term (current) drug therapy: Secondary | ICD-10-CM

## 2017-11-23 DIAGNOSIS — I429 Cardiomyopathy, unspecified: Secondary | ICD-10-CM | POA: Diagnosis present

## 2017-11-23 DIAGNOSIS — K810 Acute cholecystitis: Secondary | ICD-10-CM | POA: Diagnosis not present

## 2017-11-23 DIAGNOSIS — A419 Sepsis, unspecified organism: Secondary | ICD-10-CM | POA: Diagnosis present

## 2017-11-23 DIAGNOSIS — I5042 Chronic combined systolic (congestive) and diastolic (congestive) heart failure: Secondary | ICD-10-CM | POA: Diagnosis present

## 2017-11-23 DIAGNOSIS — N289 Disorder of kidney and ureter, unspecified: Secondary | ICD-10-CM

## 2017-11-23 DIAGNOSIS — N179 Acute kidney failure, unspecified: Secondary | ICD-10-CM | POA: Diagnosis present

## 2017-11-23 DIAGNOSIS — Z01810 Encounter for preprocedural cardiovascular examination: Secondary | ICD-10-CM | POA: Diagnosis not present

## 2017-11-23 DIAGNOSIS — E872 Acidosis: Secondary | ICD-10-CM | POA: Diagnosis present

## 2017-11-23 DIAGNOSIS — E785 Hyperlipidemia, unspecified: Secondary | ICD-10-CM | POA: Diagnosis present

## 2017-11-23 DIAGNOSIS — I5043 Acute on chronic combined systolic (congestive) and diastolic (congestive) heart failure: Secondary | ICD-10-CM | POA: Diagnosis present

## 2017-11-23 DIAGNOSIS — K8000 Calculus of gallbladder with acute cholecystitis without obstruction: Secondary | ICD-10-CM | POA: Diagnosis not present

## 2017-11-23 DIAGNOSIS — N183 Chronic kidney disease, stage 3 unspecified: Secondary | ICD-10-CM | POA: Diagnosis present

## 2017-11-23 DIAGNOSIS — F1010 Alcohol abuse, uncomplicated: Secondary | ICD-10-CM | POA: Diagnosis present

## 2017-11-23 DIAGNOSIS — E1122 Type 2 diabetes mellitus with diabetic chronic kidney disease: Secondary | ICD-10-CM | POA: Diagnosis present

## 2017-11-23 DIAGNOSIS — Z9114 Patient's other noncompliance with medication regimen: Secondary | ICD-10-CM

## 2017-11-23 DIAGNOSIS — E861 Hypovolemia: Secondary | ICD-10-CM | POA: Diagnosis present

## 2017-11-23 DIAGNOSIS — I5022 Chronic systolic (congestive) heart failure: Secondary | ICD-10-CM | POA: Diagnosis not present

## 2017-11-23 DIAGNOSIS — Z9119 Patient's noncompliance with other medical treatment and regimen: Secondary | ICD-10-CM | POA: Diagnosis not present

## 2017-11-23 DIAGNOSIS — K8020 Calculus of gallbladder without cholecystitis without obstruction: Secondary | ICD-10-CM | POA: Diagnosis not present

## 2017-11-23 DIAGNOSIS — Z87891 Personal history of nicotine dependence: Secondary | ICD-10-CM

## 2017-11-23 DIAGNOSIS — Z7982 Long term (current) use of aspirin: Secondary | ICD-10-CM | POA: Diagnosis not present

## 2017-11-23 DIAGNOSIS — I509 Heart failure, unspecified: Secondary | ICD-10-CM | POA: Diagnosis not present

## 2017-11-23 DIAGNOSIS — R1033 Periumbilical pain: Secondary | ICD-10-CM | POA: Diagnosis not present

## 2017-11-23 DIAGNOSIS — K5900 Constipation, unspecified: Secondary | ICD-10-CM | POA: Diagnosis not present

## 2017-11-23 HISTORY — DX: Chronic combined systolic (congestive) and diastolic (congestive) heart failure: I50.42

## 2017-11-23 HISTORY — DX: Chronic kidney disease, stage 3 (moderate): N18.3

## 2017-11-23 HISTORY — DX: Acute cholecystitis: K81.0

## 2017-11-23 HISTORY — DX: Disorder of kidney and ureter, unspecified: N28.9

## 2017-11-23 HISTORY — DX: Chronic kidney disease, stage 3 unspecified: N18.30

## 2017-11-23 LAB — COMPREHENSIVE METABOLIC PANEL
ALT: 19 U/L (ref 17–63)
AST: 26 U/L (ref 15–41)
Albumin: 3.9 g/dL (ref 3.5–5.0)
Alkaline Phosphatase: 67 U/L (ref 38–126)
Anion gap: 14 (ref 5–15)
BUN: 14 mg/dL (ref 6–20)
CO2: 20 mmol/L — ABNORMAL LOW (ref 22–32)
Calcium: 9.6 mg/dL (ref 8.9–10.3)
Chloride: 100 mmol/L — ABNORMAL LOW (ref 101–111)
Creatinine, Ser: 1.65 mg/dL — ABNORMAL HIGH (ref 0.61–1.24)
GFR calc Af Amer: 46 mL/min — ABNORMAL LOW (ref >60)
GFR calc non Af Amer: 39 mL/min — ABNORMAL LOW (ref >60)
Glucose, Bld: 167 mg/dL — ABNORMAL HIGH (ref 65–99)
Potassium: 4.3 mmol/L (ref 3.5–5.1)
Sodium: 134 mmol/L — ABNORMAL LOW (ref 135–145)
Total Bilirubin: 1.5 mg/dL — ABNORMAL HIGH (ref 0.3–1.2)
Total Protein: 8.8 g/dL — ABNORMAL HIGH (ref 6.5–8.1)

## 2017-11-23 LAB — URINALYSIS, ROUTINE W REFLEX MICROSCOPIC
Bilirubin Urine: NEGATIVE
Glucose, UA: NEGATIVE mg/dL
Ketones, ur: NEGATIVE mg/dL
Leukocytes, UA: NEGATIVE
Nitrite: NEGATIVE
Protein, ur: >=300 mg/dL — AB
Specific Gravity, Urine: 1.024 (ref 1.005–1.030)
pH: 5 (ref 5.0–8.0)

## 2017-11-23 LAB — CBC
HCT: 40.5 % (ref 39.0–52.0)
Hemoglobin: 13.3 g/dL (ref 13.0–17.0)
MCH: 30.9 pg (ref 26.0–34.0)
MCHC: 32.8 g/dL (ref 30.0–36.0)
MCV: 94 fL (ref 78.0–100.0)
Platelets: 313 10*3/uL (ref 150–400)
RBC: 4.31 MIL/uL (ref 4.22–5.81)
RDW: 15 % (ref 11.5–15.5)
WBC: 18.9 10*3/uL — ABNORMAL HIGH (ref 4.0–10.5)

## 2017-11-23 LAB — LACTIC ACID, PLASMA: Lactic Acid, Venous: 1.4 mmol/L (ref 0.5–1.9)

## 2017-11-23 LAB — I-STAT CG4 LACTIC ACID, ED: Lactic Acid, Venous: 3.03 mmol/L (ref 0.5–1.9)

## 2017-11-23 LAB — PROTIME-INR
INR: 1.27
Prothrombin Time: 15.8 seconds — ABNORMAL HIGH (ref 11.4–15.2)

## 2017-11-23 LAB — LIPASE, BLOOD: Lipase: 23 U/L (ref 11–51)

## 2017-11-23 LAB — APTT: aPTT: 35 seconds (ref 24–36)

## 2017-11-23 MED ORDER — FOLIC ACID 1 MG PO TABS
1.0000 mg | ORAL_TABLET | Freq: Every day | ORAL | Status: DC
  Administered 2017-11-24 – 2017-11-26 (×3): 1 mg via ORAL
  Filled 2017-11-23 (×3): qty 1

## 2017-11-23 MED ORDER — SODIUM CHLORIDE 0.9% FLUSH
3.0000 mL | Freq: Two times a day (BID) | INTRAVENOUS | Status: DC
  Administered 2017-11-24 – 2017-11-26 (×3): 3 mL via INTRAVENOUS

## 2017-11-23 MED ORDER — IOPAMIDOL (ISOVUE-300) INJECTION 61%
INTRAVENOUS | Status: AC
  Administered 2017-11-23: 85 mL
  Filled 2017-11-23: qty 75

## 2017-11-23 MED ORDER — PIPERACILLIN-TAZOBACTAM 3.375 G IVPB 30 MIN
3.3750 g | Freq: Once | INTRAVENOUS | Status: AC
  Administered 2017-11-23: 3.375 g via INTRAVENOUS
  Filled 2017-11-23: qty 50

## 2017-11-23 MED ORDER — SODIUM CHLORIDE 0.9 % IV SOLN
INTRAVENOUS | Status: DC
  Administered 2017-11-23: 23:00:00 via INTRAVENOUS

## 2017-11-23 MED ORDER — HYDRALAZINE HCL 20 MG/ML IJ SOLN: 10.0000 mg | INTRAMUSCULAR | Status: DC | PRN

## 2017-11-23 MED ORDER — ADULT MULTIVITAMIN W/MINERALS CH
1.0000 | ORAL_TABLET | Freq: Every day | ORAL | Status: DC
  Administered 2017-11-24 – 2017-11-26 (×3): 1 via ORAL
  Filled 2017-11-23 (×3): qty 1

## 2017-11-23 MED ORDER — VITAMIN B-1 100 MG PO TABS
100.0000 mg | ORAL_TABLET | Freq: Every day | ORAL | Status: DC
  Administered 2017-11-25 – 2017-11-26 (×2): 100 mg via ORAL
  Filled 2017-11-23 (×3): qty 1

## 2017-11-23 MED ORDER — PIPERACILLIN-TAZOBACTAM 3.375 G IVPB
3.3750 g | Freq: Three times a day (TID) | INTRAVENOUS | Status: DC
  Administered 2017-11-24 – 2017-11-26 (×8): 3.375 g via INTRAVENOUS
  Filled 2017-11-23 (×10): qty 50

## 2017-11-23 MED ORDER — LORAZEPAM 1 MG PO TABS: 1.0000 mg | ORAL_TABLET | Freq: Four times a day (QID) | ORAL | Status: DC | PRN

## 2017-11-23 MED ORDER — ACETAMINOPHEN 650 MG RE SUPP: 650.0000 mg | Freq: Four times a day (QID) | RECTAL | Status: DC | PRN

## 2017-11-23 MED ORDER — ONDANSETRON HCL 4 MG PO TABS: 4.0000 mg | ORAL_TABLET | Freq: Four times a day (QID) | ORAL | Status: DC | PRN

## 2017-11-23 MED ORDER — SODIUM CHLORIDE 0.9 % IV BOLUS
500.0000 mL | Freq: Once | INTRAVENOUS | Status: AC
  Administered 2017-11-23: 500 mL via INTRAVENOUS

## 2017-11-23 MED ORDER — MORPHINE SULFATE (PF) 4 MG/ML IV SOLN
1.0000 mg | INTRAVENOUS | Status: DC | PRN
  Administered 2017-11-26: 2 mg via INTRAVENOUS
  Filled 2017-11-23: qty 1

## 2017-11-23 MED ORDER — THIAMINE HCL 100 MG/ML IJ SOLN
100.0000 mg | Freq: Every day | INTRAMUSCULAR | Status: DC
  Administered 2017-11-24 (×2): 100 mg via INTRAVENOUS
  Filled 2017-11-23 (×2): qty 2

## 2017-11-23 MED ORDER — LORAZEPAM 2 MG/ML IJ SOLN: 0.0000 mg | Freq: Two times a day (BID) | INTRAMUSCULAR | Status: DC

## 2017-11-23 MED ORDER — ACETAMINOPHEN 650 MG RE SUPP
650.0000 mg | Freq: Once | RECTAL | Status: AC
  Administered 2017-11-23: 650 mg via RECTAL
  Filled 2017-11-23: qty 1

## 2017-11-23 MED ORDER — ONDANSETRON HCL 4 MG/2ML IJ SOLN: 4.0000 mg | Freq: Four times a day (QID) | INTRAMUSCULAR | Status: DC | PRN

## 2017-11-23 MED ORDER — LORAZEPAM 2 MG/ML IJ SOLN: 0.0000 mg | Freq: Four times a day (QID) | INTRAMUSCULAR | Status: DC

## 2017-11-23 MED ORDER — ACETAMINOPHEN 325 MG PO TABS
650.0000 mg | ORAL_TABLET | Freq: Four times a day (QID) | ORAL | Status: DC | PRN
  Administered 2017-11-25 (×2): 650 mg via ORAL
  Filled 2017-11-23 (×2): qty 2

## 2017-11-23 MED ORDER — LORAZEPAM 2 MG/ML IJ SOLN: 1.0000 mg | Freq: Four times a day (QID) | INTRAMUSCULAR | Status: DC | PRN

## 2017-11-23 NOTE — H&P (Signed)
History and Physical    Dominiq Fontaine EYC:[id number] DOB: 1940-09-01 DOA: 11/23/2017  PCP: Patient, No Pcp Per   Patient coming from: Home  Chief Complaint: Abdominal pain   HPI: [name] is a 77 y.o. male with medical history significant for chronic systolic CHF, chronic kidney disease stage III, hypertension, and alcohol abuse, now presenting to the emergency department for 2 days of severe abdominal pain.  Patient reports sharp, intermittent pain in the mid and upper abdomen for the past 2 days.  He has not been eating or drinking since onset of symptoms.  He was hoping they would resolve on its own, but pain continues to worsen and increase in frequency, prompting him to come in.  He denies subjective fevers.  Denies any vomiting, diarrhea, chest pain, or palpitations.  ED Course: Upon arrival to the ED, patient is found to be febrile to 39.3 C, saturating well on room air, tachycardic in the 110s, and blood pressure stable.  EKG features sinus tachycardia with rate 105 and LVH with repolarization abnormality.  Chemistry panel is notable for mild hyponatremia and a creatinine of 1.65, up from 1.4 more than a year ago.  CBC is notable for leukocytosis to 18,900 and lactic acid is 3.03.  Urinalysis features proteinuria and microscopic hematuria.  CT of the abdomen and pelvis is concerning for cholelithiasis with acute cholecystitis.  Incidental notation is made on the CT of a right kidney lower pole hypodensity concerning for pyelonephritis versus infarction, or possibly mass.  General surgery was consulted by the ED physician and recommended medical admission.  Blood cultures were collected, 500 cc normal saline was administered, and the patient was started on empiric Zosyn.  He remained stable in the ED and will be admitted to the telemetry unit for ongoing evaluation and management of sepsis secondary to acute cholecystitis.  Review of Systems:  All other systems reviewed and apart  from HPI, are negative.  Past Medical History:  Diagnosis Date  . Acute renal failure superimposed on stage 3 chronic kidney disease ([place])   . Acute respiratory distress   . Acute respiratory failure with hypoxia ([place])   . AKI (acute kidney injury) ([place])   . Alcohol abuse 02/26/2015  . CHF (congestive heart failure) ([place])   . CHF exacerbation ([place])   . Congestive heart disease ([place])   . Demand ischemia ([place])   . Diabetes mellitus without complication ([place])   . Elevated troponin 02/26/2015  . Essential hypertension   . Hyperlipidemia   . Hypertension   . Solitary kidney     History reviewed. No pertinent surgical history.   reports that he quit smoking about 9 years ago. His smoking use included cigarettes. He has never used smokeless tobacco. He reports that he drinks alcohol. He reports that he does not use drugs.  No Known Allergies  History reviewed. No pertinent family history.   Prior to Admission medications   Medication Sig Start Date End Date Taking? Authorizing Provider  acetaminophen (TYLENOL) 500 MG tablet Take 1,000 mg by mouth every 6 (six) hours as needed for moderate pain or headache.    [provider]  aspirin EC 81 MG tablet Take 81 mg by mouth daily.    [provider]  folic acid (FOLVITE) 1 MG tablet Take 1 tablet (1 mg total) by mouth daily. Patient not taking: Reported on 09/24/2016 02/27/15   Modena Jansky, MD  furosemide (LASIX) 20 MG tablet Take 1 tablet (20 mg total) by  mouth daily. Patient not taking: Reported on 09/24/2016 02/27/15   Modena Jansky, MD  HYDROcodone-acetaminophen (NORCO) 5-325 MG tablet Take 1-2 tablets by mouth every 6 (six) hours as needed for moderate pain. 09/24/16   Harris, Vernie Shanks, PA-C  lisinopril (PRINIVIL,ZESTRIL) 10 MG tablet Take 1 tablet (10 mg total) by mouth daily. Patient not taking: Reported on 09/24/2016 02/27/15   Modena Jansky, MD  Multiple Vitamin (MULTIVITAMIN WITH MINERALS) TABS tablet Take  1 tablet by mouth daily. Patient not taking: Reported on 09/24/2016 02/27/15   Modena Jansky, MD  thiamine 100 MG tablet Take 1 tablet (100 mg total) by mouth daily. Patient not taking: Reported on 09/24/2016 02/27/15   Modena Jansky, MD    Physical Exam: Vitals:   11/23/17 1320 11/23/17 2015 11/23/17 2023 11/23/17 2045  BP: (!) 150/79 (!) 141/81  121/79  Pulse: (!) 110 (!) 108  (!) 108  Resp: 18 (!) 24  19  Temp:   (!) 102.8 F (39.3 C)   TempSrc:   Rectal   SpO2: 99% 95%  97%  Weight:      Height:          Constitutional: NAD, calm  Eyes: PERTLA, lids and conjunctivae normal ENMT: Mucous membranes are moist. Posterior pharynx clear of any exudate or lesions.   Neck: normal, supple, no masses, no thyromegaly Respiratory: clear to auscultation bilaterally, no wheezing, no crackles. Normal respiratory effort.    Cardiovascular: S1 & S2 heard, regular rate and rhythm. No extremity edema. No significant JVD. Abdomen: No distension, tender in mid- and upper abdomen. Bowel sounds active.  Musculoskeletal: no clubbing / cyanosis. No joint deformity upper and lower extremities.  Skin: no significant rashes, lesions, ulcers. Poor turgor. Neurologic: CN 2-12 grossly intact. Sensation intact. Strength 5/5 in all 4 limbs.  Psychiatric: Alert and oriented x 3. Calm, cooperative.     Labs on Admission: I have personally reviewed following labs and imaging studies  CBC: Recent Labs  Lab 11/23/17 1032  WBC 18.9*  HGB 13.3  HCT 40.5  MCV 94.0  PLT 284   Basic Metabolic Panel: Recent Labs  Lab 11/23/17 1032  NA 134*  K 4.3  CL 100*  CO2 20*  GLUCOSE 167*  BUN 14  CREATININE 1.65*  CALCIUM 9.6   GFR: Estimated Creatinine Clearance: 46.3 mL/min (A) (by C-G formula based on SCr of 1.65 mg/dL (H)). Liver Function Tests: Recent Labs  Lab 11/23/17 1032  AST 26  ALT 19  ALKPHOS 67  BILITOT 1.5*  PROT 8.8*  ALBUMIN 3.9   Recent Labs  Lab 11/23/17 1032  LIPASE  23   No results for input(s): AMMONIA in the last 168 hours. Coagulation Profile: No results for input(s): INR, PROTIME in the last 168 hours. Cardiac Enzymes: No results for input(s): CKTOTAL, CKMB, CKMBINDEX, TROPONINI in the last 168 hours. BNP (last 3 results) No results for input(s): PROBNP in the last 8760 hours. HbA1C: No results for input(s): HGBA1C in the last 72 hours. CBG: No results for input(s): GLUCAP in the last 168 hours. Lipid Profile: No results for input(s): CHOL, HDL, LDLCALC, TRIG, CHOLHDL, LDLDIRECT in the last 72 hours. Thyroid Function Tests: No results for input(s): TSH, T4TOTAL, FREET4, T3FREE, THYROIDAB in the last 72 hours. Anemia Panel: No results for input(s): VITAMINB12, FOLATE, FERRITIN, TIBC, IRON, RETICCTPCT in the last 72 hours. Urine analysis:    Component Value Date/Time   COLORURINE YELLOW 11/23/2017 [place] 11/23/2017 1050  LABSPEC 1.024 11/23/2017 1050   PHURINE 5.0 11/23/2017 1050   GLUCOSEU NEGATIVE 11/23/2017 1050   HGBUR SMALL (A) 11/23/2017 1050   BILIRUBINUR NEGATIVE 11/23/2017 1050   KETONESUR NEGATIVE 11/23/2017 1050   PROTEINUR >=300 (A) 11/23/2017 1050   NITRITE NEGATIVE 11/23/2017 1050   LEUKOCYTESUR NEGATIVE 11/23/2017 1050   Sepsis Labs: @LABRCNTIP (procalcitonin:4,lacticidven:4) )No results found for this or any previous visit (from the past 240 hour(s)).   Radiological Exams on Admission: Ct Abdomen Pelvis W Contrast  Result Date: 11/23/2017 CLINICAL DATA:  Lower abdominal pain for 3 days. History of cholelithiasis. EXAM: CT ABDOMEN AND PELVIS WITH CONTRAST TECHNIQUE: Multidetector CT imaging of the abdomen and pelvis was performed using the standard protocol following bolus administration of intravenous contrast. CONTRAST:  85 cc ISOVUE-300 IOPAMIDOL (ISOVUE-300) INJECTION 61% COMPARISON:  Renal ultrasound February 26, 2015 FINDINGS: LOWER CHEST: Lung bases are clear. The heart is mildly enlarged. No  pericardial effusion. HEPATOBILIARY: Multiple calcified gallstones in layering tiny gallstones versus sludge. Gallbladder wall thickening pericholecystic fat stranding and small volume fluid. PANCREAS: Normal. SPLEEN: Normal. ADRENALS/URINARY TRACT: Kidneys are orthotopic, demonstrating symmetric enhancement. No nephrolithiasis, hydronephrosis. Ill-defined 2.4 x 1.8 cm hypodensity lower pole RIGHT kidney on early phase, slightly larger appearing on delayed phase. Too small to characterize hypodensities bilateral kidneys. The unopacified ureters are normal in course and caliber. Delayed imaging through the kidneys demonstrates symmetric prompt contrast excretion within the proximal urinary collecting system. Urinary bladder is partially distended and unremarkable. Normal adrenal glands. STOMACH/BOWEL: Upper small hiatal hernia. The stomach, small and large bowel are normal in course and caliber without inflammatory changes. Severe sigmoid colonic diverticulosis. Normal appendix. VASCULAR/LYMPHATIC: Aortoiliac vessels are normal in course and caliber. Mild calcific atherosclerosis. No lymphadenopathy by CT size criteria. REPRODUCTIVE: Prostatomegaly invading base of bladder. OTHER: No focal fluid collection. Small volume free fluid in the pelvis. No intraperitoneal free air. Moderate fat containing umbilical hernia. MUSCULOSKELETAL: Nonacute. Mild degenerative change of the lumbar spine. IMPRESSION: 1. Cholelithiasis and acute cholecystitis. 2. RIGHT lower pole ill-defined hypodensity concerning for pyelonephritis, less likely infarct. Recommend follow-up after resolution of acute symptoms to exclude underlying mass. 3. Severe colonic diverticulosis. Aortic Atherosclerosis (ICD10-I70.0). Electronically Signed   By: Elon Alas M.D.   On: 11/23/2017 18:31    EKG: Independently reviewed. Sinus tachycardia (105), LVH with repolarization abnormality.   Assessment/Plan  1. Acute cholecystitis  - Presents with  2 days of abdominal pain, found to have fever, leukocytosis, elevated lactate, and CT findings consistent with acute cholecystitis  - Surgery is consulting and much appreciated  - Blood cultures were collected in ED, 500 cc NS bolus given, and empiric Zosyn started  - Continue bowel-rest, IV abx, gentle IVF mindful of CHF, analgesia, and antiemetics   2. Chronic systolic CHF  - Appears hypovolemic on admission - Has not been eating or drinking for 2 days prior to admit  - EF 25-30% in 2016  - Reports he does not take the prescribed Lasix or ACE  - Treated in ED with 500 cc NS and will be continued cautiously on IVF while follow daily wt and I/O's    3. CKD stage III - SCr is 1.65 on admission, up from 1.4 in January 2018 (most recent prior)  - Renally-dose medications, avoid nephrotoxins, repeat chem panel in am   4. Hypertension  - BP at goal  - Hold lisinopril for surgery, states he has not been taking    5. Alcohol abuse  - No withdrawal sxs on  admission  - Monitor with CIWA and prn Ativan   6. Right kidney lesion   - Right lower pole lesion noted incidentally, possibly reflecting pyelonephritis, infarct, or mass  - There is no urinary sxs, UA not suggestive of infection, and no flank pain  - Culture urine, follow-up imaging outpatient to exclude mass     DVT prophylaxis: SCD's  Code Status: Full  Family Communication: Discussed with patient Consults called: Surgery Admission status: Inpatient    Vianne Bulls, MD [facility] Pager [phone]  If 7PM-7AM, please contact night-coverage [email or web address] Password [facility]  11/23/2017, 9:18 PM

## 2017-11-23 NOTE — ED Provider Notes (Signed)
[facility] Provider Note   CSN: [id number] Arrival date & time: 11/23/17  1002     History   Chief Complaint Chief Complaint  Patient presents with  . Abdominal Pain    HPI [name] is a 77 y.o. male with history of diabetes, CHF, hypertension, stage III CKD who presents with a 2-day history of periumbilical pain.  This pain is intermittent and sharp.  He has had associated intermittent constipation.  He reports using an enema.  He denies any nausea, vomiting, diarrhea, bloody stools.  Denies any urinary symptoms.  He did not take any medication prior to arrival.  He reports he has not eaten in 2 days because he knew he was coming to the hospital and knew he would get blood drawn. He reports he does have an appetite. He denies any chest pain, any shortness of breath.  HPI  Past Medical History:  Diagnosis Date  . Acute renal failure superimposed on stage 3 chronic kidney disease ([place])   . Acute respiratory distress   . Acute respiratory failure with hypoxia ([place])   . AKI (acute kidney injury) ([place])   . Alcohol abuse 02/26/2015  . CHF (congestive heart failure) ([place])   . CHF exacerbation ([place])   . Congestive heart disease ([place])   . Demand ischemia ([place])   . Diabetes mellitus without complication ([place])   . Elevated troponin 02/26/2015  . Essential hypertension   . Hyperlipidemia   . Hypertension   . Solitary kidney     Patient Active Problem List   Diagnosis Date Noted  . Acute cholecystitis 11/23/2017  . Renal lesion 11/23/2017  . Alcohol abuse 02/26/2015  . Hyperlipidemia   . Diabetes mellitus without complication ([place])   . CKD (chronic kidney disease), stage III ([place])   . Chronic systolic CHF (congestive heart failure) ([place])   . Essential hypertension     History reviewed. No pertinent surgical history.      Home Medications    Prior to Admission medications   Medication Sig Start Date End Date Taking? Authorizing Provider    acetaminophen (TYLENOL) 500 MG tablet Take 1,000 mg by mouth every 6 (six) hours as needed for moderate pain or headache.    [provider]  aspirin EC 81 MG tablet Take 81 mg by mouth daily.    [provider]  folic acid (FOLVITE) 1 MG tablet Take 1 tablet (1 mg total) by mouth daily. Patient not taking: Reported on 09/24/2016 02/27/15   Modena Jansky, MD  furosemide (LASIX) 20 MG tablet Take 1 tablet (20 mg total) by mouth daily. Patient not taking: Reported on 09/24/2016 02/27/15   Modena Jansky, MD  HYDROcodone-acetaminophen (NORCO) 5-325 MG tablet Take 1-2 tablets by mouth every 6 (six) hours as needed for moderate pain. 09/24/16   Harris, Vernie Shanks, PA-C  lisinopril (PRINIVIL,ZESTRIL) 10 MG tablet Take 1 tablet (10 mg total) by mouth daily. Patient not taking: Reported on 09/24/2016 02/27/15   Modena Jansky, MD  Multiple Vitamin (MULTIVITAMIN WITH MINERALS) TABS tablet Take 1 tablet by mouth daily. Patient not taking: Reported on 09/24/2016 02/27/15   Modena Jansky, MD  thiamine 100 MG tablet Take 1 tablet (100 mg total) by mouth daily. Patient not taking: Reported on 09/24/2016 02/27/15   Modena Jansky, MD    Family History History reviewed. No pertinent family history.  Social History Social History   Tobacco Use  . Smoking status: Former Smoker  Types: Cigarettes    Last attempt to quit: 02/26/2008    Years since quitting: 9.7  . Smokeless tobacco: Never Used  Substance Use Topics  . Alcohol use: Yes    Comment: occ  . Drug use: No     Allergies   Patient has no known allergies.   Review of Systems Review of Systems  Constitutional: Negative for chills and fever.  HENT: Negative for facial swelling and sore throat.   Respiratory: Negative for shortness of breath.   Cardiovascular: Negative for chest pain.  Gastrointestinal: Positive for abdominal pain and constipation. Negative for nausea and vomiting.  Genitourinary: Negative for  dysuria.  Musculoskeletal: Negative for back pain.  Skin: Negative for rash and wound.  Neurological: Negative for headaches.  Psychiatric/Behavioral: The patient is not nervous/anxious.      Physical Exam Updated Vital Signs BP 130/79 (BP Location: Right Arm)   Pulse 99   Temp 98.1 F (36.7 C) (Oral)   Resp 18   Ht 5\' 11"  (1.803 m)   Wt 95.3 kg (210 lb)   SpO2 93%   BMI 29.29 kg/m   Physical Exam  Constitutional: He appears well-developed and well-nourished. No distress.  HENT:  Head: Normocephalic and atraumatic.  Mouth/Throat: Oropharynx is clear and moist. No oropharyngeal exudate.  Eyes: Pupils are equal, round, and reactive to light. Conjunctivae are normal. Right eye exhibits no discharge. Left eye exhibits no discharge. No scleral icterus.  Neck: Normal range of motion. Neck supple. No thyromegaly present.  Cardiovascular: Normal rate, regular rhythm, normal heart sounds and intact distal pulses. Exam reveals no gallop and no friction rub.  No murmur heard. Pulmonary/Chest: Effort normal and breath sounds normal. No stridor. No respiratory distress. He has no wheezes. He has no rales.  Abdominal: Soft. Bowel sounds are normal. He exhibits no distension. There is tenderness in the periumbilical area. There is no rebound and no guarding.  Musculoskeletal: He exhibits no edema.  Lymphadenopathy:    He has no cervical adenopathy.  Neurological: He is alert. Coordination normal.  Skin: Skin is warm and dry. No rash noted. He is not diaphoretic. No pallor.  Psychiatric: He has a normal mood and affect.  Nursing note and vitals reviewed.    ED Treatments / Results  Labs (all labs ordered are listed, but only abnormal results are displayed) Labs Reviewed  COMPREHENSIVE METABOLIC PANEL - Abnormal; Notable for the following components:      Result Value   Sodium 134 (*)    Chloride 100 (*)    CO2 20 (*)    Glucose, Bld 167 (*)    Creatinine, Ser 1.65 (*)    Total  Protein 8.8 (*)    Total Bilirubin 1.5 (*)    GFR calc non Af Amer 39 (*)    GFR calc Af Amer 46 (*)    All other components within normal limits  CBC - Abnormal; Notable for the following components:   WBC 18.9 (*)    All other components within normal limits  URINALYSIS, ROUTINE W REFLEX MICROSCOPIC - Abnormal; Notable for the following components:   Hgb urine dipstick SMALL (*)    Protein, ur >=300 (*)    Bacteria, UA RARE (*)    Squamous Epithelial / LPF 0-5 (*)    All other components within normal limits  PROTIME-INR - Abnormal; Notable for the following components:   Prothrombin Time 15.8 (*)    All other components within normal limits  I-STAT CG4 LACTIC  ACID, ED - Abnormal; Notable for the following components:   Lactic Acid, Venous 3.03 (*)    All other components within normal limits  CULTURE, BLOOD (ROUTINE X 2)  CULTURE, BLOOD (ROUTINE X 2)  URINE CULTURE  LIPASE, BLOOD  LACTIC ACID, PLASMA  APTT  COMPREHENSIVE METABOLIC PANEL  CBC WITH DIFFERENTIAL/PLATELET  LIPASE, BLOOD  LACTIC ACID, PLASMA  I-STAT CG4 LACTIC ACID, ED    EKG None  Radiology Ct Abdomen Pelvis W Contrast  Result Date: 11/23/2017 CLINICAL DATA:  Lower abdominal pain for 3 days. History of cholelithiasis. EXAM: CT ABDOMEN AND PELVIS WITH CONTRAST TECHNIQUE: Multidetector CT imaging of the abdomen and pelvis was performed using the standard protocol following bolus administration of intravenous contrast. CONTRAST:  85 cc ISOVUE-300 IOPAMIDOL (ISOVUE-300) INJECTION 61% COMPARISON:  Renal ultrasound February 26, 2015 FINDINGS: LOWER CHEST: Lung bases are clear. The heart is mildly enlarged. No pericardial effusion. HEPATOBILIARY: Multiple calcified gallstones in layering tiny gallstones versus sludge. Gallbladder wall thickening pericholecystic fat stranding and small volume fluid. PANCREAS: Normal. SPLEEN: Normal. ADRENALS/URINARY TRACT: Kidneys are orthotopic, demonstrating symmetric enhancement. No  nephrolithiasis, hydronephrosis. Ill-defined 2.4 x 1.8 cm hypodensity lower pole RIGHT kidney on early phase, slightly larger appearing on delayed phase. Too small to characterize hypodensities bilateral kidneys. The unopacified ureters are normal in course and caliber. Delayed imaging through the kidneys demonstrates symmetric prompt contrast excretion within the proximal urinary collecting system. Urinary bladder is partially distended and unremarkable. Normal adrenal glands. STOMACH/BOWEL: Upper small hiatal hernia. The stomach, small and large bowel are normal in course and caliber without inflammatory changes. Severe sigmoid colonic diverticulosis. Normal appendix. VASCULAR/LYMPHATIC: Aortoiliac vessels are normal in course and caliber. Mild calcific atherosclerosis. No lymphadenopathy by CT size criteria. REPRODUCTIVE: Prostatomegaly invading base of bladder. OTHER: No focal fluid collection. Small volume free fluid in the pelvis. No intraperitoneal free air. Moderate fat containing umbilical hernia. MUSCULOSKELETAL: Nonacute. Mild degenerative change of the lumbar spine. IMPRESSION: 1. Cholelithiasis and acute cholecystitis. 2. RIGHT lower pole ill-defined hypodensity concerning for pyelonephritis, less likely infarct. Recommend follow-up after resolution of acute symptoms to exclude underlying mass. 3. Severe colonic diverticulosis. Aortic Atherosclerosis (ICD10-I70.0). Electronically Signed   By: Elon Alas M.D.   On: 11/23/2017 18:31   US Abdomen Limited Ruq  Result Date: 11/23/2017 CLINICAL DATA:  77 year old male with cholelithiasis and CT evidence of acute cholecystitis today. Abdominal pain. EXAM: ULTRASOUND ABDOMEN LIMITED RIGHT UPPER QUADRANT COMPARISON:  CT Abdomen and Pelvis 1806 hours. FINDINGS: Gallbladder: Multiple gallstones individually estimated up to 20 millimeters diameter. Positive gallbladder wall thickening of 5-7 millimeters (image 13). Evidence of trace pericholecystic  fluid. Equivocal sonographic Murphy sign. Common bile duct: Diameter: 6 millimeters, upper limits of normal. Liver: Small round 18 millimeter echogenic focus in the central right hepatic lobe on image 52 is most compatible with a small benign hemangioma. There is a small simple appearing hepatic cyst in the left lobe measuring 8 millimeters (image 76). Background liver echogenicity is at the upper limits of normal. Portal vein is patent on color Doppler imaging with normal direction of blood flow towards the liver. Other findings: Negative visible right kidney. IMPRESSION: 1. Cholelithiasis and gallbladder wall thickening with trace pericholecystic fluid are strongly suggestive of Acute Cholecystitis. Individual gallstone size estimated at 20 mm. 2. CBD at the upper limits of normal. 3. Several small benign hepatic lesions are incidentally noted. Electronically Signed   By: Genevie Ann M.D.   On: 11/23/2017 22:24    Procedures Procedures (including  critical care time)  CRITICAL CARE Performed by: Frederica Kuster   Total critical care time: 30 minutes  Critical care time was exclusive of separately billable procedures and treating other patients.  Critical care was necessary to treat or prevent imminent or life-threatening deterioration.  Critical care was time spent personally by me on the following activities: development of treatment plan with patient and/or surrogate as well as nursing, discussions with consultants, evaluation of patient's response to treatment, examination of patient, obtaining history from patient or surrogate, ordering and performing treatments and interventions, ordering and review of laboratory studies, ordering and review of radiographic studies, pulse oximetry and re-evaluation of patient's condition.   Medications Ordered in ED Medications  LORazepam (ATIVAN) tablet 1 mg (has no administration in time range)    Or  LORazepam (ATIVAN) injection 1 mg (has no  administration in time range)  thiamine (VITAMIN B-1) tablet 100 mg ( Oral See Alternative 11/24/17 0020)    Or  thiamine (B-1) injection 100 mg (100 mg Intravenous Given [birth year] 0938)  folic acid (FOLVITE) tablet 1 mg (1 mg Oral Given 11/24/17 0020)  multivitamin with minerals tablet 1 tablet (1 tablet Oral Given 11/24/17 0020)  LORazepam (ATIVAN) injection 0-4 mg (0 mg Intravenous Not Given 11/23/17 2301)    Followed by  LORazepam (ATIVAN) injection 0-4 mg (has no administration in time range)  sodium chloride flush (NS) 0.9 % injection 3 mL (3 mLs Intravenous Given 11/24/17 0021)  0.9 %  sodium chloride infusion ( Intravenous New Bag/Given 11/23/17 2259)  acetaminophen (TYLENOL) tablet 650 mg (has no administration in time range)    Or  acetaminophen (TYLENOL) suppository 650 mg (has no administration in time range)  ondansetron (ZOFRAN) tablet 4 mg (has no administration in time range)    Or  ondansetron (ZOFRAN) injection 4 mg (has no administration in time range)  piperacillin-tazobactam (ZOSYN) IVPB 3.375 g (has no administration in time range)  hydrALAZINE (APRESOLINE) injection 10 mg (has no administration in time range)  morphine 4 MG/ML injection 1-3 mg (has no administration in time range)  sodium chloride 0.9 % bolus 500 mL (0 mLs Intravenous Stopped 11/23/17 2017)  iopamidol (ISOVUE-300) 61 % injection (85 mLs  Contrast Given 11/23/17 1812)  piperacillin-tazobactam (ZOSYN) IVPB 3.375 g (0 g Intravenous Stopped 11/23/17 2105)  acetaminophen (TYLENOL) suppository 650 mg (650 mg Rectal Given 11/23/17 2040)     Initial Impression / Assessment and Plan / ED Course  I have reviewed the triage vital signs and the nursing notes.  Pertinent labs & imaging results that were available during my care of the patient were reviewed by me and considered in my medical decision making (see chart for details).     Patient with acute cholecystitis.  Also meeting sepsis criteria.  Sepsis protocol  initiated, however lactic 3.03.  Considering patient's CHF history, given only 500 mL bolus to start.  Zosyn initiated.  Blood cultures pending.  CMP shows sodium 134, chloride 100, CO2 20, glucose 167, creatinine 1.65, total bilirubin 1.5.  CT shows cholelithiasis and acute cholecystitis as well as right lower pole ill-defined hypodensity concerning for pyelonephritis, less likely infarct; recommend follow-up after resolution of acute symptoms to exclude underlying mass; severe colonic diverticulosis.  I spoke with general surgeon, Dr. Ninfa Linden, who will evaluate the patient in consult, but recommends medicine admit.  I spoke with Dr. Maudie Mercury with [facility] who will admit the patient for further evaluation and treatment.  I appreciate both consultants assistance with the  patient.  Patient also evaluated by Dr. Ellender Hose who guided the patient's management and agrees with plan.  Final Clinical Impressions(s) / ED Diagnoses   Final diagnoses:  Cholecystitis    ED Discharge Orders    None       Frederica Kuster, PA-C 11/24/17 0041    Duffy Bruce, MD 11/24/17 1002

## 2017-11-23 NOTE — ED Notes (Signed)
Patient transported to Ultrasound 

## 2017-11-23 NOTE — ED Triage Notes (Signed)
Patient complains of sharp intermittent abdominal pain with no associated symptoms x 2 days. Alert and oriented, nad

## 2017-11-24 ENCOUNTER — Encounter (HOSPITAL_COMMUNITY): Payer: Self-pay | Admitting: Physician Assistant

## 2017-11-24 ENCOUNTER — Other Ambulatory Visit (HOSPITAL_COMMUNITY): Payer: Medicare Other

## 2017-11-24 DIAGNOSIS — N183 Chronic kidney disease, stage 3 (moderate): Secondary | ICD-10-CM

## 2017-11-24 DIAGNOSIS — Z01810 Encounter for preprocedural cardiovascular examination: Secondary | ICD-10-CM

## 2017-11-24 DIAGNOSIS — F1010 Alcohol abuse, uncomplicated: Secondary | ICD-10-CM

## 2017-11-24 DIAGNOSIS — I5022 Chronic systolic (congestive) heart failure: Secondary | ICD-10-CM

## 2017-11-24 DIAGNOSIS — K819 Cholecystitis, unspecified: Secondary | ICD-10-CM

## 2017-11-24 DIAGNOSIS — K810 Acute cholecystitis: Secondary | ICD-10-CM

## 2017-11-24 LAB — COMPREHENSIVE METABOLIC PANEL
ALT: 15 U/L — ABNORMAL LOW (ref 17–63)
AST: 18 U/L (ref 15–41)
Albumin: 3.1 g/dL — ABNORMAL LOW (ref 3.5–5.0)
Alkaline Phosphatase: 53 U/L (ref 38–126)
Anion gap: 9 (ref 5–15)
BUN: 19 mg/dL (ref 6–20)
CO2: 24 mmol/L (ref 22–32)
Calcium: 8.4 mg/dL — ABNORMAL LOW (ref 8.9–10.3)
Chloride: 102 mmol/L (ref 101–111)
Creatinine, Ser: 1.92 mg/dL — ABNORMAL HIGH (ref 0.61–1.24)
GFR calc Af Amer: 38 mL/min — ABNORMAL LOW (ref >60)
GFR calc non Af Amer: 33 mL/min — ABNORMAL LOW (ref >60)
Glucose, Bld: 142 mg/dL — ABNORMAL HIGH (ref 65–99)
Potassium: 4 mmol/L (ref 3.5–5.1)
Sodium: 135 mmol/L (ref 135–145)
Total Bilirubin: 1.5 mg/dL — ABNORMAL HIGH (ref 0.3–1.2)
Total Protein: 7.3 g/dL (ref 6.5–8.1)

## 2017-11-24 LAB — CBC WITH DIFFERENTIAL/PLATELET
Basophils Absolute: 0 10*3/uL (ref 0.0–0.1)
Basophils Relative: 0 %
Eosinophils Absolute: 0.1 10*3/uL (ref 0.0–0.7)
Eosinophils Relative: 1 %
HCT: 35 % — ABNORMAL LOW (ref 39.0–52.0)
Hemoglobin: 11.3 g/dL — ABNORMAL LOW (ref 13.0–17.0)
Lymphocytes Relative: 8 %
Lymphs Abs: 1.3 10*3/uL (ref 0.7–4.0)
MCH: 30.4 pg (ref 26.0–34.0)
MCHC: 32.3 g/dL (ref 30.0–36.0)
MCV: 94.1 fL (ref 78.0–100.0)
Monocytes Absolute: 1.4 10*3/uL — ABNORMAL HIGH (ref 0.1–1.0)
Monocytes Relative: 9 %
Neutro Abs: 13.4 10*3/uL — ABNORMAL HIGH (ref 1.7–7.7)
Neutrophils Relative %: 82 %
Platelets: 249 10*3/uL (ref 150–400)
RBC: 3.72 MIL/uL — ABNORMAL LOW (ref 4.22–5.81)
RDW: 15.2 % (ref 11.5–15.5)
WBC: 16.3 10*3/uL — ABNORMAL HIGH (ref 4.0–10.5)

## 2017-11-24 LAB — GLUCOSE, CAPILLARY: Glucose-Capillary: 121 mg/dL — ABNORMAL HIGH (ref 65–99)

## 2017-11-24 LAB — LIPASE, BLOOD: Lipase: 22 U/L (ref 11–51)

## 2017-11-24 LAB — LACTIC ACID, PLASMA: Lactic Acid, Venous: 1.2 mmol/L (ref 0.5–1.9)

## 2017-11-24 MED ORDER — LIVING BETTER WITH HEART FAILURE BOOK
Freq: Once | Status: AC
  Administered 2017-11-24: 23:00:00

## 2017-11-24 MED ORDER — LORAZEPAM 2 MG/ML IJ SOLN: 0.0000 mg | Freq: Four times a day (QID) | INTRAMUSCULAR | Status: AC

## 2017-11-24 MED ORDER — CARVEDILOL 6.25 MG PO TABS
6.2500 mg | ORAL_TABLET | Freq: Two times a day (BID) | ORAL | Status: DC
  Administered 2017-11-25: 6.25 mg via ORAL
  Filled 2017-11-24: qty 1

## 2017-11-24 MED ORDER — SODIUM CHLORIDE 0.9 % IV SOLN
INTRAVENOUS | Status: DC
  Administered 2017-11-24 – 2017-11-25 (×3): via INTRAVENOUS

## 2017-11-24 MED ORDER — LORAZEPAM 2 MG/ML IJ SOLN: 0.0000 mg | Freq: Two times a day (BID) | INTRAMUSCULAR | Status: DC

## 2017-11-24 NOTE — Progress Notes (Signed)
Subjective: He feels much better this morning.  Denies abdominal pain. Currently nothing by mouth. On IV Zosyn To be seen by cardiology due to CHF and low ejection fraction by history  CT scan shows gallstones and acute cholecystitis, right renal lower pole hypodensity concerning for pyelonephritis, less likely infarct.  Severe colonic diverticulosis  Morning lab shows WBC 16,300.  Hemoglobin 11.3.  Total bilirubin 1.5 at rest of LFTs normal.  Lipase 22.  Lactic acid now normal at 1.  Objective: Vital signs in last 24 hours: Temp:  [97.8 F (36.6 C)-102.8 F (39.3 C)] 98.4 F (36.9 C) (03/28 0425) Pulse Rate:  [97-117] 97 (03/28 0425) Resp:  [14-25] 18 (03/28 0425) BP: (107-154)/(55-81) 143/70 (03/28 0425) SpO2:  [93 %-99 %] 98 % (03/28 0425) Weight:  [95.3 kg (210 lb)] 95.3 kg (210 lb) (03/27 2236) Last BM Date: 11/22/17  Intake/Output from previous day: 03/27 0701 - 03/28 0700 In: 727.5 [I.V.:727.5] Out: 0  Intake/Output this shift: Total I/O In: 727.5 [I.V.:727.5] Out: 0   General appearance: Alert.  Cooperative.  No distress.  Mental status normal Resp: clear to auscultation bilaterally GI: Abdomen is soft and nontender.  Not distended.  No scars.  Small umbilical hernia  Lab Results:  Recent Labs    11/23/17 1032 11/24/17 0230  WBC 18.9* 16.3*  HGB 13.3 11.3*  HCT 40.5 35.0*  PLT 313 249   BMET Recent Labs    11/23/17 1032 11/24/17 0230  NA 134* 135  K 4.3 4.0  CL 100* 102  CO2 20* 24  GLUCOSE 167* 142*  BUN 14 19  CREATININE 1.65* 1.92*  CALCIUM 9.6 8.4*   PT/INR Recent Labs    11/23/17 2229  LABPROT 15.8*  INR 1.27   ABG No results for input(s): PHART, HCO3 in the last 72 hours.  Invalid input(s): PCO2, PO2  Studies/Results: Ct Abdomen Pelvis W Contrast  Result Date: 11/23/2017 CLINICAL DATA:  Lower abdominal pain for 3 days. History of cholelithiasis. EXAM: CT ABDOMEN AND PELVIS WITH CONTRAST TECHNIQUE: Multidetector CT imaging of  the abdomen and pelvis was performed using the standard protocol following bolus administration of intravenous contrast. CONTRAST:  85 cc ISOVUE-300 IOPAMIDOL (ISOVUE-300) INJECTION 61% COMPARISON:  Renal ultrasound February 26, 2015 FINDINGS: LOWER CHEST: Lung bases are clear. The heart is mildly enlarged. No pericardial effusion. HEPATOBILIARY: Multiple calcified gallstones in layering tiny gallstones versus sludge. Gallbladder wall thickening pericholecystic fat stranding and small volume fluid. PANCREAS: Normal. SPLEEN: Normal. ADRENALS/URINARY TRACT: Kidneys are orthotopic, demonstrating symmetric enhancement. No nephrolithiasis, hydronephrosis. Ill-defined 2.4 x 1.8 cm hypodensity lower pole RIGHT kidney on early phase, slightly larger appearing on delayed phase. Too small to characterize hypodensities bilateral kidneys. The unopacified ureters are normal in course and caliber. Delayed imaging through the kidneys demonstrates symmetric prompt contrast excretion within the proximal urinary collecting system. Urinary bladder is partially distended and unremarkable. Normal adrenal glands. STOMACH/BOWEL: Upper small hiatal hernia. The stomach, small and large bowel are normal in course and caliber without inflammatory changes. Severe sigmoid colonic diverticulosis. Normal appendix. VASCULAR/LYMPHATIC: Aortoiliac vessels are normal in course and caliber. Mild calcific atherosclerosis. No lymphadenopathy by CT size criteria. REPRODUCTIVE: Prostatomegaly invading base of bladder. OTHER: No focal fluid collection. Small volume free fluid in the pelvis. No intraperitoneal free air. Moderate fat containing umbilical hernia. MUSCULOSKELETAL: Nonacute. Mild degenerative change of the lumbar spine. IMPRESSION: 1. Cholelithiasis and acute cholecystitis. 2. RIGHT lower pole ill-defined hypodensity concerning for pyelonephritis, less likely infarct. Recommend follow-up after resolution of  acute symptoms to exclude underlying  mass. 3. Severe colonic diverticulosis. Aortic Atherosclerosis (ICD10-I70.0). Electronically Signed   By: Elon Alas M.D.   On: 11/23/2017 18:31   US Abdomen Limited Ruq  Result Date: 11/23/2017 CLINICAL DATA:  77 year old male with cholelithiasis and CT evidence of acute cholecystitis today. Abdominal pain. EXAM: ULTRASOUND ABDOMEN LIMITED RIGHT UPPER QUADRANT COMPARISON:  CT Abdomen and Pelvis 1806 hours. FINDINGS: Gallbladder: Multiple gallstones individually estimated up to 20 millimeters diameter. Positive gallbladder wall thickening of 5-7 millimeters (image 13). Evidence of trace pericholecystic fluid. Equivocal sonographic Murphy sign. Common bile duct: Diameter: 6 millimeters, upper limits of normal. Liver: Small round 18 millimeter echogenic focus in the central right hepatic lobe on image 52 is most compatible with a small benign hemangioma. There is a small simple appearing hepatic cyst in the left lobe measuring 8 millimeters (image 76). Background liver echogenicity is at the upper limits of normal. Portal vein is patent on color Doppler imaging with normal direction of blood flow towards the liver. Other findings: Negative visible right kidney. IMPRESSION: 1. Cholelithiasis and gallbladder wall thickening with trace pericholecystic fluid are strongly suggestive of Acute Cholecystitis. Individual gallstone size estimated at 20 mm. 2. CBD at the upper limits of normal. 3. Several small benign hepatic lesions are incidentally noted. Electronically Signed   By: Genevie Ann M.D.   On: 11/23/2017 22:24    Anti-infectives: Anti-infectives (From admission, onward)   Start     Dose/Rate Route Frequency Ordered Stop   11/24/17 0200  piperacillin-tazobactam (ZOSYN) IVPB 3.375 g     3.375 g 12.5 mL/hr over 240 Minutes Intravenous Every 8 hours 11/23/17 2119     11/23/17 1845  piperacillin-tazobactam (ZOSYN) IVPB 3.375 g     3.375 g 100 mL/hr over 30 Minutes Intravenous  Once 11/23/17 1836  11/23/17 2105      Assessment/Plan:  Acute cholecystitis with cholelithiasis.  Significant symptomatic improvement overnight on antibiotics and bowel rest.  Still, gallbladder is likely source of sepsis  Right renal lesion.  Question pyelonephritis versus infarct vs. Incidental.  Not really symptomatic check urinalysis  Chronic systolic CHF...somewhat noncompliant and not followed by cardiology in [place]. Needs formal cardiology evaluation this morning CKD stage III Hypertension  alcohol  Abuse--no agitation or signs of withdrawal at this hour   Plan: -I am inclined to continue medical management with bowel rest and antibiotics today, pending cardiology evaluation -He seems to be fairly high risk and I tentatively favor percutaneous cholecystostomy over general anesthesia and cholecystectomy, given his comorbidities -We'll follow closely and decide with you as further evaluation progresses -No indication for acute surgical intervention today    LOS: 1 day    Adin Hector 11/24/2017

## 2017-11-24 NOTE — Progress Notes (Signed)
[facility]                                                                              Patient Demographics  [name], is a 77 y.o. male, DOB - [birth year], EML:[id number]  Admit date - 11/23/2017   Admitting Physician Vianne Bulls, MD  Outpatient Primary MD for the patient is Patient, No Pcp Per  Outpatient specialists:   LOS - 1  days   Medical records reviewed and are as summarized below:    Chief Complaint  Patient presents with  . Abdominal Pain       Brief summary   Patient is a 77 year old male with chronic systolic CHF, EF 71-21%, CKD stage III, hypertension, alcohol use presented to ED with 2 days of severe abdominal pain.  Reported sharp, intermittent in mid and upper abdomen for last 2 days, not been eating or drinking since onset.  Denied any subjective fevers, any vomiting.  In ED, septic with leukocytosis 18.9, lactic acidosis 3.0, febrile 102.8 F, tachycardiac 110-128. CT abdomen pelvis showed cholelithiasis with acute cholecystitis, right lower pole ill-defined hypodensity concerning for pyelonephritis, less likely infarct, recommend follow-up after the resolution of acute symptoms to exclude any underlying mass.   Assessment & Plan    Principal Problem:   Acute cholecystitis with sepsis - met sepsis criteria in ED on admission with leukocytosis 18.9, lactic acidosis 3.0, febrile 102.8 F, tachycardiac 110-128.  Source likely due to acute cholecystitis -Right upper quadrant ultrasound showed cholelithiasis and gallbladder wall thickening with trace pericholecystic fluid suggestive of acute cholecystitis. -Continue NPO, hydration, IV Zosyn -General surgery consulted, recommended cardiac preop evaluation, cardiology consult placed  Active Problems:   Alcohol use -Currently stable, no withdrawals, continue Ativan with CIWA -Continue gentle hydration, thiamine, folate  Acute on CKD (chronic kidney disease), stage III  (HCC) -Baseline creatinine 1.4-1.7 -Creatinine currently 1.9, likely due to #1, continue gentle hydration    Chronic systolic CHF (congestive heart failure) (HCC) -Currently euvolemic, 2D echo in 2016 showed EF of 25 to 30%.  Reports that he does not take prescribed Lasix or ACE inhibitor. -Continue cautious IV fluids, cardiology consult has been obtained from preop cardiac evaluation -Follow 2D echo    Essential hypertension -BP currently stable, patient has not been taking lisinopril  Right renal lesion -CT abdomen and pelvis incidentally showed right lower lobe ill-defined density possibly pyelonephritis, less likely infarct, rule out mass once acute symptoms are resolved.  Patient has no urinary symptoms.  UA negative, no UTI or hematuria. -Recommend outpatient follow-up by PCP or renal ultrasound prior to discharge.   Code Status: full  DVT Prophylaxis: SCD's Family Communication: Discussed in detail with the patient, all imaging results, lab results explained to the patient and nephew in the room with patient's permission   Disposition Plan:   Time Spent in minutes 35 minutes  Procedures:   Consultants:   General surgery Cardiology  Antimicrobials:   IV Zosyn 3/27   Medications  Scheduled Meds: . folic acid  1 mg Oral Daily  . Living Better with Heart Failure Book   Does not apply Once  . LORazepam  0-4 mg Intravenous Q6H   Followed by  . [START ON 11/25/2017] LORazepam  0-4 mg Intravenous Q12H  . multivitamin with minerals  1 tablet Oral Daily  . sodium chloride flush  3 mL Intravenous Q12H  . thiamine  100 mg Oral Daily   Or  . thiamine  100 mg Intravenous Daily   Continuous Infusions: . sodium chloride 75 mL/hr at 11/24/17 0700  . piperacillin-tazobactam (ZOSYN)  IV 3.375 g (11/24/17 0951)   PRN Meds:.acetaminophen **OR** acetaminophen, hydrALAZINE, LORazepam **OR** LORazepam, morphine injection, ondansetron **OR** ondansetron (ZOFRAN)  IV   Antibiotics   Anti-infectives (From admission, onward)   Start     Dose/Rate Route Frequency Ordered Stop   11/24/17 0200  piperacillin-tazobactam (ZOSYN) IVPB 3.375 g     3.375 g 12.5 mL/hr over 240 Minutes Intravenous Every 8 hours 11/23/17 2119     11/23/17 1845  piperacillin-tazobactam (ZOSYN) IVPB 3.375 g     3.375 g 100 mL/hr over 30 Minutes Intravenous  Once 11/23/17 1836 11/23/17 2105        Subjective:   [name] was seen and examined today.  Feeling a lot better this morning, afebrile, abdominal pain improving, no nausea vomiting. Patient denies dizziness, chest pain, shortness of breath, new weakness, numbess, tingling. No acute events overnight.    Objective:   Vitals:   11/23/17 2236 11/24/17 0425 11/24/17 0759 11/24/17 1119  BP: 130/79 (!) 143/70 140/70   Pulse: 99 97 96   Resp: 18 18 18    Temp: 98.1 F (36.7 C) 98.4 F (36.9 C) 98.2 F (36.8 C)   TempSrc: Oral Oral Oral   SpO2: 93% 98% 99%   Weight: 95.3 kg (210 lb)   96.7 kg (213 lb 1.6 oz)  Height: 5' 11"  (1.803 m)       Intake/Output Summary (Last 24 hours) at 11/24/2017 1139 Last data filed at 11/24/2017 0800 Gross per 24 hour  Intake 727.5 ml  Output 0 ml  Net 727.5 ml     Wt Readings from Last 3 Encounters:  11/24/17 96.7 kg (213 lb 1.6 oz)  09/24/16 97.1 kg (214 lb)     Exam  General: Alert and oriented x 3, NAD  Eyes:   HEENT:    Cardiovascular: S1 S2 auscultated, Regular rate and rhythm.  Respiratory: Clear to auscultation bilaterally, no wheezing, rales or rhonchi  Gastrointestinal: Soft, nontender, nondistended, + bowel sounds  Ext: no pedal edema bilaterally  Neuro: no new deficits  Musculoskeletal: No digital cyanosis, clubbing  Skin: No rashes  Psych: Normal affect and demeanor, alert and oriented x3    Data Reviewed:  I have personally reviewed following labs and imaging studies  Micro Results No results found for this or any previous visit  (from the past 240 hour(s)).  Radiology Reports Ct Abdomen Pelvis W Contrast  Result Date: 11/23/2017 CLINICAL DATA:  Lower abdominal pain for 3 days. History of cholelithiasis. EXAM: CT ABDOMEN AND PELVIS WITH CONTRAST TECHNIQUE: Multidetector CT imaging of the abdomen and pelvis was performed using the standard protocol following bolus administration of intravenous contrast. CONTRAST:  85 cc ISOVUE-300 IOPAMIDOL (ISOVUE-300) INJECTION 61% COMPARISON:  Renal ultrasound February 26, 2015 FINDINGS: LOWER CHEST: Lung bases are clear. The heart is mildly enlarged. No pericardial effusion. HEPATOBILIARY: Multiple calcified gallstones in layering tiny gallstones versus sludge. Gallbladder wall thickening pericholecystic fat stranding and small volume fluid. PANCREAS: Normal. SPLEEN: Normal. ADRENALS/URINARY TRACT: Kidneys are orthotopic, demonstrating symmetric enhancement. No nephrolithiasis, hydronephrosis. Ill-defined 2.4 x 1.8  cm hypodensity lower pole RIGHT kidney on early phase, slightly larger appearing on delayed phase. Too small to characterize hypodensities bilateral kidneys. The unopacified ureters are normal in course and caliber. Delayed imaging through the kidneys demonstrates symmetric prompt contrast excretion within the proximal urinary collecting system. Urinary bladder is partially distended and unremarkable. Normal adrenal glands. STOMACH/BOWEL: Upper small hiatal hernia. The stomach, small and large bowel are normal in course and caliber without inflammatory changes. Severe sigmoid colonic diverticulosis. Normal appendix. VASCULAR/LYMPHATIC: Aortoiliac vessels are normal in course and caliber. Mild calcific atherosclerosis. No lymphadenopathy by CT size criteria. REPRODUCTIVE: Prostatomegaly invading base of bladder. OTHER: No focal fluid collection. Small volume free fluid in the pelvis. No intraperitoneal free air. Moderate fat containing umbilical hernia. MUSCULOSKELETAL: Nonacute. Mild  degenerative change of the lumbar spine. IMPRESSION: 1. Cholelithiasis and acute cholecystitis. 2. RIGHT lower pole ill-defined hypodensity concerning for pyelonephritis, less likely infarct. Recommend follow-up after resolution of acute symptoms to exclude underlying mass. 3. Severe colonic diverticulosis. Aortic Atherosclerosis (ICD10-I70.0). Electronically Signed   By: Elon Alas M.D.   On: 11/23/2017 18:31   US Abdomen Limited Ruq  Result Date: 11/23/2017 CLINICAL DATA:  76 year old male with cholelithiasis and CT evidence of acute cholecystitis today. Abdominal pain. EXAM: ULTRASOUND ABDOMEN LIMITED RIGHT UPPER QUADRANT COMPARISON:  CT Abdomen and Pelvis 1806 hours. FINDINGS: Gallbladder: Multiple gallstones individually estimated up to 20 millimeters diameter. Positive gallbladder wall thickening of 5-7 millimeters (image 13). Evidence of trace pericholecystic fluid. Equivocal sonographic Murphy sign. Common bile duct: Diameter: 6 millimeters, upper limits of normal. Liver: Small round 18 millimeter echogenic focus in the central right hepatic lobe on image 52 is most compatible with a small benign hemangioma. There is a small simple appearing hepatic cyst in the left lobe measuring 8 millimeters (image 76). Background liver echogenicity is at the upper limits of normal. Portal vein is patent on color Doppler imaging with normal direction of blood flow towards the liver. Other findings: Negative visible right kidney. IMPRESSION: 1. Cholelithiasis and gallbladder wall thickening with trace pericholecystic fluid are strongly suggestive of Acute Cholecystitis. Individual gallstone size estimated at 20 mm. 2. CBD at the upper limits of normal. 3. Several small benign hepatic lesions are incidentally noted. Electronically Signed   By: Genevie Ann M.D.   On: 11/23/2017 22:24    Lab Data:  CBC: Recent Labs  Lab 11/23/17 1032 11/24/17 0230  WBC 18.9* 16.3*  NEUTROABS  --  13.4*  HGB 13.3 11.3*  HCT  40.5 35.0*  MCV 94.0 94.1  PLT 313 389   Basic Metabolic Panel: Recent Labs  Lab 11/23/17 1032 11/24/17 0230  NA 134* 135  K 4.3 4.0  CL 100* 102  CO2 20* 24  GLUCOSE 167* 142*  BUN 14 19  CREATININE 1.65* 1.92*  CALCIUM 9.6 8.4*   GFR: Estimated Creatinine Clearance: 40.1 mL/min (A) (by C-G formula based on SCr of 1.92 mg/dL (H)). Liver Function Tests: Recent Labs  Lab 11/23/17 1032 11/24/17 0230  AST 26 18  ALT 19 15*  ALKPHOS 67 53  BILITOT 1.5* 1.5*  PROT 8.8* 7.3  ALBUMIN 3.9 3.1*   Recent Labs  Lab 11/23/17 1032 11/24/17 0230  LIPASE 23 22   No results for input(s): AMMONIA in the last 168 hours. Coagulation Profile: Recent Labs  Lab 11/23/17 2229  INR 1.27   Cardiac Enzymes: No results for input(s): CKTOTAL, CKMB, CKMBINDEX, TROPONINI in the last 168 hours. BNP (last 3 results) No results for input(s):  PROBNP in the last 8760 hours. HbA1C: No results for input(s): HGBA1C in the last 72 hours. CBG: Recent Labs  Lab 11/24/17 0745  GLUCAP 121*   Lipid Profile: No results for input(s): CHOL, HDL, LDLCALC, TRIG, CHOLHDL, LDLDIRECT in the last 72 hours. Thyroid Function Tests: No results for input(s): TSH, T4TOTAL, FREET4, T3FREE, THYROIDAB in the last 72 hours. Anemia Panel: No results for input(s): VITAMINB12, FOLATE, FERRITIN, TIBC, IRON, RETICCTPCT in the last 72 hours. Urine analysis:    Component Value Date/Time   COLORURINE YELLOW 11/23/2017 1050   APPEARANCEUR CLEAR 11/23/2017 1050   LABSPEC 1.024 11/23/2017 1050   PHURINE 5.0 11/23/2017 1050   GLUCOSEU NEGATIVE 11/23/2017 1050   HGBUR SMALL (A) 11/23/2017 1050   BILIRUBINUR NEGATIVE 11/23/2017 1050   KETONESUR NEGATIVE 11/23/2017 1050   PROTEINUR >=300 (A) 11/23/2017 1050   NITRITE NEGATIVE 11/23/2017 1050   LEUKOCYTESUR NEGATIVE 11/23/2017 1050     Ripudeep Rai M.D. [facility] 11/24/2017, 11:39 AM  Pager: [phone] Between 7am to 7pm - call Pager -  [phone]  After 7pm go to [email or web address] - password [email or web address]  Call night coverage person covering after 7pm

## 2017-11-24 NOTE — Consult Note (Addendum)
Cardiology Consultation:   Patient ID: [name]; [id number]; 1941-03-09   Admit date: 11/23/2017 Date of Consult: 11/24/2017  Primary Care Provider: Patient, No Pcp Per Primary Cardiologist: Seen by Dr. Meda Coffee in 2016  Chief Complaint: abdominal pain  Patient Profile:   [name] is a 77 y.o. male with a hx of HTN, HLD, DM, chronic combined CHF, CKD stage III, solitary kidney, alcohol abuse who is being seen today for the evaluation of pre-operative evaluation at the request of Dr. Dalbert Batman.  History of Present Illness:   He was seen by cardiology in June 2016 for congestive heart failure.  At that time he had been visiting from [place], and no prior information about his heart history had been known.  He had reported a remote stress test that was normal but records were not available. The patient did not ever recall having a history of cath or cardiomyopathy. 2D echo 02/26/15 revealed EF 25-30% with diffuse hypokinesis, grade 1 diastolic dysfunction, mild mitral regurgitation, moderately dilated left atrium, normal RV.  Cardiac cath was offered to the patient during that admission, but he expressed preference to have this instead done in [place].  His troponin peaked at 0.11 during that admission. The patient states he did follow up with cardiology in [place] but the decision was made to treat him with medicine. Unfortunately he's not really taking any cardiac meds at this time aside from aspirin. He denies having any further ischemic workup. He presented with 2 days of severe abdominal pain without vomiting diarrhea chest pain or palpitations.  Imaging showed gallstones and acute cholecystitis, right renal lower pole hypodensity concerning for pyelonephritis, less likely infarct, and severe colonic diverticulosis.  His initial lab workup has been notable for creatinine of 1.65-> 1.92 (baseline in prior labs appears 1.4-1.7), leukocytosis with white blood cell count of 18.9k,  lactic acid of 3.03, and greater than 300 protein in his urine.  He was also febrile overnight and was mildly tachycardic, with mildly elevated blood pressure at times, but this also appears labile, with lowest reading of 107/55.  He is currently on CIWA protocol and Zosyn.  He has been treated with IV fluids.  For the past several years he reports he's had chronic dyspnea on exertion which he attributed to normal part of aging. He also tends to get occasional chest pain with exertion, requiring him to stop and rest. This improves the pain. As a result, he says "I try not to push myself too much." He doesn't get chest pain every time he exerts himself. He does not get any chest pain at rest. He's had some intermittent LEE but this is usually in times of gout. He denies orthopnea, syncope, PND. When asked about alcohol he is not forthcoming about quantities and seems to evade the question, prefacing any answer with "I'm not gonna lie about it, I do drink some." He currently feels much better this AM.  Past Medical History:  Diagnosis Date  . Alcohol abuse 02/26/2015  . Chronic combined systolic and diastolic CHF (congestive heart failure) ([place])   . CKD (chronic kidney disease), stage III ([place])   . Demand ischemia ([place])    a. minimally elevated troponin peak 0.11 in 2016, felt demand ischemia. Cath offered but pt wished to have done back in [place].  . Diabetes mellitus without complication ([place])   . Essential hypertension   . Hyperlipidemia   . Hypertension   . Solitary kidney     History reviewed.  No pertinent surgical history.   Inpatient Medications: Scheduled Meds: . folic acid  1 mg Oral Daily  . Living Better with Heart Failure Book   Does not apply Once  . LORazepam  0-4 mg Intravenous Q6H   Followed by  . [START ON 11/25/2017] LORazepam  0-4 mg Intravenous Q12H  . multivitamin with minerals  1 tablet Oral Daily  . sodium chloride flush  3 mL Intravenous Q12H  . thiamine  100 mg Oral  Daily   Or  . thiamine  100 mg Intravenous Daily   Continuous Infusions: . sodium chloride 75 mL/hr at 11/24/17 0700  . piperacillin-tazobactam (ZOSYN)  IV 3.375 g (11/24/17 0951)   PRN Meds: acetaminophen **OR** acetaminophen, hydrALAZINE, LORazepam **OR** LORazepam, morphine injection, ondansetron **OR** ondansetron (ZOFRAN) IV  Home Meds: Prior to Admission medications   Medication Sig Start Date End Date Taking? Authorizing Provider  acetaminophen (TYLENOL) 500 MG tablet Take 1,000 mg by mouth every 6 (six) hours as needed for moderate pain or headache.    [provider]  aspirin EC 81 MG tablet Take 81 mg by mouth daily.    [provider]  folic acid (FOLVITE) 1 MG tablet Take 1 tablet (1 mg total) by mouth daily. Patient not taking: Reported on 09/24/2016 02/27/15   Modena Jansky, MD  furosemide (LASIX) 20 MG tablet Take 1 tablet (20 mg total) by mouth daily. Patient not taking: Reported on 09/24/2016 02/27/15   Modena Jansky, MD  HYDROcodone-acetaminophen (NORCO) 5-325 MG tablet Take 1-2 tablets by mouth every 6 (six) hours as needed for moderate pain. 09/24/16   Harris, Vernie Shanks, PA-C  lisinopril (PRINIVIL,ZESTRIL) 10 MG tablet Take 1 tablet (10 mg total) by mouth daily. Patient not taking: Reported on 09/24/2016 02/27/15   Modena Jansky, MD  Multiple Vitamin (MULTIVITAMIN WITH MINERALS) TABS tablet Take 1 tablet by mouth daily. Patient not taking: Reported on 09/24/2016 02/27/15   Modena Jansky, MD  thiamine 100 MG tablet Take 1 tablet (100 mg total) by mouth daily. Patient not taking: Reported on 09/24/2016 02/27/15   Modena Jansky, MD    Allergies:   No Known Allergies  Social History:   Social History   Socioeconomic History  . Marital status: Married    Spouse name: Not on file  . Number of children: Not on file  . Years of education: Not on file  . Highest education level: Not on file  Occupational History  . Not on file  Social  Needs  . Financial resource strain: Not on file  . Food insecurity:    Worry: Not on file    Inability: Not on file  . Transportation needs:    Medical: Not on file    Non-medical: Not on file  Tobacco Use  . Smoking status: Former Smoker    Types: Cigarettes    Last attempt to quit: 02/26/2008    Years since quitting: 9.7  . Smokeless tobacco: Never Used  Substance and Sexual Activity  . Alcohol use: Yes    Comment: occ  . Drug use: No  . Sexual activity: Not on file  Lifestyle  . Physical activity:    Days per week: Not on file    Minutes per session: Not on file  . Stress: Not on file  Relationships  . Social connections:    Talks on phone: Not on file    Gets together: Not on file    Attends religious service: Not on  file    Active member of club or organization: Not on file    Attends meetings of clubs or organizations: Not on file    Relationship status: Not on file  . Intimate partner violence:    Fear of current or ex partner: Not on file    Emotionally abused: Not on file    Physically abused: Not on file    Forced sexual activity: Not on file  Other Topics Concern  . Not on file  Social History Narrative  . Not on file    Family History:   The patient's family history includes Other in his mother. There is no history of CAD.  Mother passed away in her sleep 71 y/o  ROS:  Please see the history of present illness.  All other ROS reviewed and negative.     Physical Exam/Data:   Vitals:   11/23/17 2236 11/24/17 0425 11/24/17 0759 11/24/17 1119  BP: 130/79 (!) 143/70 140/70   Pulse: 99 97 96   Resp: 18 18 18    Temp: 98.1 F (36.7 C) 98.4 F (36.9 C) 98.2 F (36.8 C)   TempSrc: Oral Oral Oral   SpO2: 93% 98% 99%   Weight: 210 lb (95.3 kg)   213 lb 1.6 oz (96.7 kg)  Height: 5\' 11"  (1.803 m)       Intake/Output Summary (Last 24 hours) at 11/24/2017 1140 Last data filed at 11/24/2017 0800 Gross per 24 hour  Intake 727.5 ml  Output 0 ml  Net 727.5  ml   Filed Weights   11/23/17 1021 11/23/17 2236 11/24/17 1119  Weight: 210 lb (95.3 kg) 210 lb (95.3 kg) 213 lb 1.6 oz (96.7 kg)   Body mass index is 29.72 kg/m.  General: Chronically ill appearing AAM in no acute distress. Head: Normocephalic, atraumatic, sclera with bilateral icterus, no xanthomas, nares are without discharge. Neck: Negative for carotid bruits. JVD not elevated. Lungs: Clear bilaterally to auscultation without wheezes, rales, or rhonchi. Breathing is unlabored. Heart: RRR with S1 S2. No murmurs, rubs, or gallops appreciated. Abdomen: Soft, non-tender, non-distended with normoactive bowel sounds. No hepatomegaly. No rebound/guarding. No obvious abdominal masses. Msk:  Strength and tone appear normal for age. Extremities: No clubbing or cyanosis. No edema.  Distal pedal pulses are 2+ and equal bilaterally. Neuro: Alert and oriented X 3. No facial asymmetry. No focal deficit. Moves all extremities spontaneously. Psych:  Responds to questions appropriately with a normal affect.  EKG:  The EKG was personally reviewed and demonstrates NSR with LVH and repolarization changes  Relevant CV Studies: 2D Echo Study Conclusions  - Left ventricle: The cavity size was moderately dilated. Systolic   function was severely reduced. The estimated ejection fraction   was in the range of 25% to 30%. Diffuse hypokinesis. Doppler   parameters are consistent with abnormal left ventricular   relaxation (grade 1 diastolic dysfunction). Doppler parameters   are consistent with elevated ventricular end-diastolic filling   pressure. - Ventricular septum: Septal motion showed paradox. - Aortic valve: Trileaflet; normal thickness leaflets. There was   trivial regurgitation. - Aortic root: The aortic root was normal in size. - Mitral valve: Structurally normal valve. There was mild   regurgitation. - Left atrium: The atrium was moderately dilated. - Right ventricle: Systolic function was  normal. - Right atrium: The atrium was normal in size. - Pulmonic valve: There was trivial regurgitation. - Pulmonary arteries: The main pulmonary artery was normal-sized.   Systolic pressure was within the normal  range. - Inferior vena cava: The vessel was normal in size. - Pericardium, extracardiac: There was no pericardial effusion. Impressions: - Moderately dilated left ventricle with severely impaired systolic   function and diffuse hypokinesis suggestive of non-ischemic   cardiomyopathy.  Laboratory Data:  Chemistry Recent Labs  Lab 11/23/17 1032 11/24/17 0230  NA 134* 135  K 4.3 4.0  CL 100* 102  CO2 20* 24  GLUCOSE 167* 142*  BUN 14 19  CREATININE 1.65* 1.92*  CALCIUM 9.6 8.4*  GFRNONAA 39* 33*  GFRAA 46* 38*  ANIONGAP 14 9    Recent Labs  Lab 11/23/17 1032 11/24/17 0230  PROT 8.8* 7.3  ALBUMIN 3.9 3.1*  AST 26 18  ALT 19 15*  ALKPHOS 67 53  BILITOT 1.5* 1.5*   Hematology Recent Labs  Lab 11/23/17 1032 11/24/17 0230  WBC 18.9* 16.3*  RBC 4.31 3.72*  HGB 13.3 11.3*  HCT 40.5 35.0*  MCV 94.0 94.1  MCH 30.9 30.4  MCHC 32.8 32.3  RDW 15.0 15.2  PLT 313 249   Cardiac EnzymesNo results for input(s): TROPONINI in the last 168 hours. No results for input(s): TROPIPOC in the last 168 hours.  BNPNo results for input(s): BNP, PROBNP in the last 168 hours.  DDimer No results for input(s): DDIMER in the last 168 hours.  Radiology/Studies:  Ct Abdomen Pelvis W Contrast  Result Date: 11/23/2017 CLINICAL DATA:  Lower abdominal pain for 3 days. History of cholelithiasis. EXAM: CT ABDOMEN AND PELVIS WITH CONTRAST TECHNIQUE: Multidetector CT imaging of the abdomen and pelvis was performed using the standard protocol following bolus administration of intravenous contrast. CONTRAST:  85 cc ISOVUE-300 IOPAMIDOL (ISOVUE-300) INJECTION 61% COMPARISON:  Renal ultrasound February 26, 2015 FINDINGS: LOWER CHEST: Lung bases are clear. The heart is mildly enlarged. No  pericardial effusion. HEPATOBILIARY: Multiple calcified gallstones in layering tiny gallstones versus sludge. Gallbladder wall thickening pericholecystic fat stranding and small volume fluid. PANCREAS: Normal. SPLEEN: Normal. ADRENALS/URINARY TRACT: Kidneys are orthotopic, demonstrating symmetric enhancement. No nephrolithiasis, hydronephrosis. Ill-defined 2.4 x 1.8 cm hypodensity lower pole RIGHT kidney on early phase, slightly larger appearing on delayed phase. Too small to characterize hypodensities bilateral kidneys. The unopacified ureters are normal in course and caliber. Delayed imaging through the kidneys demonstrates symmetric prompt contrast excretion within the proximal urinary collecting system. Urinary bladder is partially distended and unremarkable. Normal adrenal glands. STOMACH/BOWEL: Upper small hiatal hernia. The stomach, small and large bowel are normal in course and caliber without inflammatory changes. Severe sigmoid colonic diverticulosis. Normal appendix. VASCULAR/LYMPHATIC: Aortoiliac vessels are normal in course and caliber. Mild calcific atherosclerosis. No lymphadenopathy by CT size criteria. REPRODUCTIVE: Prostatomegaly invading base of bladder. OTHER: No focal fluid collection. Small volume free fluid in the pelvis. No intraperitoneal free air. Moderate fat containing umbilical hernia. MUSCULOSKELETAL: Nonacute. Mild degenerative change of the lumbar spine. IMPRESSION: 1. Cholelithiasis and acute cholecystitis. 2. RIGHT lower pole ill-defined hypodensity concerning for pyelonephritis, less likely infarct. Recommend follow-up after resolution of acute symptoms to exclude underlying mass. 3. Severe colonic diverticulosis. Aortic Atherosclerosis (ICD10-I70.0). Electronically Signed   By: Elon Alas M.D.   On: 11/23/2017 18:31   US Abdomen Limited Ruq  Result Date: 11/23/2017 CLINICAL DATA:  77 year old male with cholelithiasis and CT evidence of acute cholecystitis today.  Abdominal pain. EXAM: ULTRASOUND ABDOMEN LIMITED RIGHT UPPER QUADRANT COMPARISON:  CT Abdomen and Pelvis 1806 hours. FINDINGS: Gallbladder: Multiple gallstones individually estimated up to 20 millimeters diameter. Positive gallbladder wall thickening of 5-7 millimeters (image 13). Evidence  of trace pericholecystic fluid. Equivocal sonographic Murphy sign. Common bile duct: Diameter: 6 millimeters, upper limits of normal. Liver: Small round 18 millimeter echogenic focus in the central right hepatic lobe on image 52 is most compatible with a small benign hemangioma. There is a small simple appearing hepatic cyst in the left lobe measuring 8 millimeters (image 76). Background liver echogenicity is at the upper limits of normal. Portal vein is patent on color Doppler imaging with normal direction of blood flow towards the liver. Other findings: Negative visible right kidney. IMPRESSION: 1. Cholelithiasis and gallbladder wall thickening with trace pericholecystic fluid are strongly suggestive of Acute Cholecystitis. Individual gallstone size estimated at 20 mm. 2. CBD at the upper limits of normal. 3. Several small benign hepatic lesions are incidentally noted. Electronically Signed   By: Genevie Ann M.D.   On: 11/23/2017 22:24    Assessment and Plan:   1. Acute cholecystitis with cholelithiasis, right renal lesion - being managed by IM/surgical team. With regard to pre-operative clearance, revised cardiac risk index is >11% based on comorbidities. Surgical team is currently favoring percutaneous cholecystostomy over general anesthesia and cholecystectomy, given his comorbidities. I would agree with this approach but will review with Dr. Marlou Porch. The patient describes some exertional CP or SOB which could represent angina, putting him at higher risk in general.  2. Chronic combined CHF (unknown if truly ICM vs NICM) - poor insight into condition, complicated by noncompliance with medications. He cites insurance change  as a barrier to care. He has not had an ischemic workup recently except for remote stress test. 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 He reports intermittent DOE with exertional CP but he chalked this up to "when you get older." I do believe he would benefit from an ischemic workup at some point but will discuss timing with MD. Resume ASA when OK with surgery. Consider addition of beta blocker. Would hold off initiation of ACEI/ARB/Spiro/Entresto given his rise in creatinine this admission. He appears euvolemic on exam. Had long talk about what CHF means, what symptoms of CHF include, 2g sodium restriction, 2L fluid restriction, daily weights with patient. Will rx CHF booklet for review. Will need close OP follow-up.  3. Alcohol abuse - pt counseled on cessation.  4. Essential HTN - follow with anticipated med changes.  5. CKD stage III/solitary kidney - primary team following. Cr slightly increased this AM.  For questions or updates, please contact [place] Please consult [email or web address] for contact info under Cardiology/STEMI.    Signed, Charlie Pitter, PA-C  11/24/2017 31:67 AM   77 year old male here with acute cholecystitis with chronic systolic heart failure.  Fever, abdominal pain has subsided on Zosyn IV.  No shortness of breath, laying flat comfortably, no orthopnea, no PND, no syncope.  He is back from [name] and lives full-time in [place].  Previously he was lost to follow-up.  I saw him a few years ago and recommended cardiac catheterization at that time given his cardiomyopathy but he did not have this done.  Exam: Alert, moves all extremities, mildly protuberant abdomen, positive bowel sounds, lungs are clear, minimal JVD, heart regular rate and rhythm without any significant murmurs.  Labs: Personally reviewed as above.  Creatinine has slightly increased from 1.6-1.9.  Echocardiogram EF 25%.  Assessment and  plan:  Preoperative cardiac risk assessment - Favor percutaneous drainage.  He would be at increased risk from a cardiovascular perspective given his underlying cardiomyopathy at least moderate.  His heart failure appears to be stable, he is euvolemic.  Chronic systolic heart failure - We will go ahead and initiate carvedilol 6.25 mg p.o. twice a day. -As outpatient, we will consider further medication such as resuming ACE inhibitor.  At this point with his creatinine slightly rising we will hold off.  Alcohol use- continue to encourage cessation.  We will have him follow up in clinic.   Candee Furbish, MD

## 2017-11-24 NOTE — Consult Note (Signed)
Reason for Consult:cholecystitis Referring Physician: Dr. Christia Reading Opyd  [name] is an 77 y.o. male.  HPI: This is a 77 year old gentleman with a history of CHF, chronic kidney disease, hypertension, and alcohol abuse who presented to the emergency department with a 2-day history of right-sided and epigastric abdominal pain.  He described the pain as sharp and moderate to severe.  He had no nausea or vomiting.  The pain did not refer anywhere else.  He reports no prior history of similar pain.  He denies jaundice.  He is not sure when he last saw a cardiologist but he reports that it was in [place].  When last evaluated here in 2016, his EF was 25-30%.  At admission, he was found to have an elevated white blood count.  His bilirubin was also elevated.  A CT scan showed a thick-walled gallbladder with gallstones and pericholecystic stranding and fluid.  Past Medical History:  Diagnosis Date  . Acute renal failure superimposed on stage 3 chronic kidney disease ([place])   . Acute respiratory distress   . Acute respiratory failure with hypoxia ([place])   . AKI (acute kidney injury) ([place])   . Alcohol abuse 02/26/2015  . CHF (congestive heart failure) ([place])   . CHF exacerbation ([place])   . Congestive heart disease ([place])   . Demand ischemia ([place])   . Diabetes mellitus without complication ([place])   . Elevated troponin 02/26/2015  . Essential hypertension   . Hyperlipidemia   . Hypertension   . Solitary kidney     History reviewed. No pertinent surgical history.  History reviewed. No pertinent family history.  Social History:  reports that he quit smoking about 9 years ago. His smoking use included cigarettes. He has never used smokeless tobacco. He reports that he drinks alcohol. He reports that he does not use drugs.  Allergies: No Known Allergies  Medications: I have reviewed the patient's current medications.  Results for orders placed or performed during the hospital encounter of  11/23/17 (from the past 48 hour(s))  Lipase, blood     Status: None   Collection Time: 11/23/17 10:32 AM  Result Value Ref Range   Lipase 23 11 - 51 U/L    Comment: Performed at [facility], [address] [place], [place] [address]  Comprehensive metabolic panel     Status: Abnormal   Collection Time: 11/23/17 10:32 AM  Result Value Ref Range   Sodium 134 (L) 135 - 145 mmol/L   Potassium 4.3 3.5 - 5.1 mmol/L   Chloride 100 (L) 101 - 111 mmol/L   CO2 20 (L) 22 - 32 mmol/L   Glucose, Bld 167 (H) 65 - 99 mg/dL   BUN 14 6 - 20 mg/dL   Creatinine, Ser 1.65 (H) 0.61 - 1.24 mg/dL   Calcium 9.6 8.9 - 10.3 mg/dL   Total Protein 8.8 (H) 6.5 - 8.1 g/dL   Albumin 3.9 3.5 - 5.0 g/dL   AST 26 15 - 41 U/L   ALT 19 17 - 63 U/L   Alkaline Phosphatase 67 38 - 126 U/L   Total Bilirubin 1.5 (H) 0.3 - 1.2 mg/dL   GFR calc non Af Amer 39 (L) >60 mL/min   GFR calc Af Amer 46 (L) >60 mL/min    Comment: (NOTE) The eGFR has been calculated using the CKD EPI equation. This calculation has not been validated in all clinical situations. eGFR's persistently <60 mL/min signify possible Chronic Kidney Disease.    Anion  gap 14 5 - 15    Comment: Performed at [facility], [place] [address] [place], [place] [address]  CBC     Status: Abnormal   Collection Time: 11/23/17 10:32 AM  Result Value Ref Range   WBC 18.9 (H) 4.0 - 10.5 K/uL   RBC 4.31 4.22 - 5.81 MIL/uL   Hemoglobin 13.3 13.0 - 17.0 g/dL   HCT 40.5 39.0 - 52.0 %   MCV 94.0 78.0 - 100.0 fL   MCH 30.9 26.0 - 34.0 pg   MCHC 32.8 30.0 - 36.0 g/dL   RDW 15.0 11.5 - 15.5 %   Platelets 313 150 - 400 K/uL    Comment: Performed at [facility], [place] [address], [place], [place] [address]  Urinalysis, Routine w reflex microscopic     Status: Abnormal   Collection Time: 11/23/17 10:50 AM  Result Value Ref Range   Color, Urine YELLOW YELLOW   APPearance CLEAR CLEAR   Specific Gravity, Urine 1.024 1.005 - 1.030   pH 5.0 5.0 - 8.0    Glucose, UA NEGATIVE NEGATIVE mg/dL   Hgb urine dipstick SMALL (A) NEGATIVE   Bilirubin Urine NEGATIVE NEGATIVE   Ketones, ur NEGATIVE NEGATIVE mg/dL   Protein, ur >=300 (A) NEGATIVE mg/dL   Nitrite NEGATIVE NEGATIVE   Leukocytes, UA NEGATIVE NEGATIVE   RBC / HPF 0-5 0 - 5 RBC/hpf   WBC, UA 0-5 0 - 5 WBC/hpf   Bacteria, UA RARE (A) NONE SEEN   Squamous Epithelial / LPF 0-5 (A) NONE SEEN   Mucus PRESENT    Hyaline Casts, UA PRESENT     Comment: Performed at [facility] [address], [place], [place] [address]  I-Stat CG4 Lactic Acid, ED     Status: Abnormal   Collection Time: 11/23/17  7:01 PM  Result Value Ref Range   Lactic Acid, Venous 3.03 (HH) 0.5 - 1.9 mmol/L   Comment NOTIFIED PHYSICIAN   Lactic acid, plasma     Status: None   Collection Time: 11/23/17 10:29 PM  Result Value Ref Range   Lactic Acid, Venous 1.4 0.5 - 1.9 mmol/L    Comment: Performed at [facility] [address], [place], [place] [address]  Protime-INR     Status: Abnormal   Collection Time: 11/23/17 10:29 PM  Result Value Ref Range   Prothrombin Time 15.8 (H) 11.4 - 15.2 seconds   INR 1.27     Comment: Performed at [facility] [address] [place], [name] [address]  APTT     Status: None   Collection Time: 11/23/17 10:29 PM  Result Value Ref Range   aPTT 35 24 - 36 seconds    Comment: Performed at [facility] [address], [place], [place] [address]    Ct Abdomen Pelvis W Contrast  Result Date: 11/23/2017 CLINICAL DATA:  Lower abdominal pain for 3 days. History of cholelithiasis. EXAM: CT ABDOMEN AND PELVIS WITH CONTRAST TECHNIQUE: Multidetector CT imaging of the abdomen and pelvis was performed using the standard protocol following bolus administration of intravenous contrast. CONTRAST:  85 cc ISOVUE-300 IOPAMIDOL (ISOVUE-300) INJECTION 61% COMPARISON:  Renal ultrasound February 26, 2015 FINDINGS: LOWER CHEST: Lung bases are clear. The heart is mildly enlarged. No  pericardial effusion. HEPATOBILIARY: Multiple calcified gallstones in layering tiny gallstones versus sludge. Gallbladder wall thickening pericholecystic fat stranding and small volume fluid. PANCREAS: Normal. SPLEEN: Normal. ADRENALS/URINARY TRACT: Kidneys are orthotopic, demonstrating symmetric  enhancement. No nephrolithiasis, hydronephrosis. Ill-defined 2.4 x 1.8 cm hypodensity lower pole RIGHT kidney on early phase, slightly larger appearing on delayed phase. Too small to characterize hypodensities bilateral kidneys. The unopacified ureters are normal in course and caliber. Delayed imaging through the kidneys demonstrates symmetric prompt contrast excretion within the proximal urinary collecting system. Urinary bladder is partially distended and unremarkable. Normal adrenal glands. STOMACH/BOWEL: Upper small hiatal hernia. The stomach, small and large bowel are normal in course and caliber without inflammatory changes. Severe sigmoid colonic diverticulosis. Normal appendix. VASCULAR/LYMPHATIC: Aortoiliac vessels are normal in course and caliber. Mild calcific atherosclerosis. No lymphadenopathy by CT size criteria. REPRODUCTIVE: Prostatomegaly invading base of bladder. OTHER: No focal fluid collection. Small volume free fluid in the pelvis. No intraperitoneal free air. Moderate fat containing umbilical hernia. MUSCULOSKELETAL: Nonacute. Mild degenerative change of the lumbar spine. IMPRESSION: 1. Cholelithiasis and acute cholecystitis. 2. RIGHT lower pole ill-defined hypodensity concerning for pyelonephritis, less likely infarct. Recommend follow-up after resolution of acute symptoms to exclude underlying mass. 3. Severe colonic diverticulosis. Aortic Atherosclerosis (ICD10-I70.0). Electronically Signed   By: Elon Alas M.D.   On: 11/23/2017 18:31   US Abdomen Limited Ruq  Result Date: 11/23/2017 CLINICAL DATA:  77 year old male with cholelithiasis and CT evidence of acute cholecystitis today.  Abdominal pain. EXAM: ULTRASOUND ABDOMEN LIMITED RIGHT UPPER QUADRANT COMPARISON:  CT Abdomen and Pelvis 1806 hours. FINDINGS: Gallbladder: Multiple gallstones individually estimated up to 20 millimeters diameter. Positive gallbladder wall thickening of 5-7 millimeters (image 13). Evidence of trace pericholecystic fluid. Equivocal sonographic Murphy sign. Common bile duct: Diameter: 6 millimeters, upper limits of normal. Liver: Small round 18 millimeter echogenic focus in the central right hepatic lobe on image 52 is most compatible with a small benign hemangioma. There is a small simple appearing hepatic cyst in the left lobe measuring 8 millimeters (image 76). Background liver echogenicity is at the upper limits of normal. Portal vein is patent on color Doppler imaging with normal direction of blood flow towards the liver. Other findings: Negative visible right kidney. IMPRESSION: 1. Cholelithiasis and gallbladder wall thickening with trace pericholecystic fluid are strongly suggestive of Acute Cholecystitis. Individual gallstone size estimated at 20 mm. 2. CBD at the upper limits of normal. 3. Several small benign hepatic lesions are incidentally noted. Electronically Signed   By: Genevie Ann M.D.   On: 11/23/2017 22:24    Review of Systems  Constitutional: Negative for chills, fever and weight loss.  Respiratory: Negative for shortness of breath.   Cardiovascular: Negative for chest pain.  Gastrointestinal: Positive for abdominal pain. Negative for diarrhea, nausea and vomiting.  Genitourinary: Negative for dysuria.  All other systems reviewed and are negative.  Blood pressure 130/79, pulse 99, temperature 98.1 F (36.7 C), temperature source Oral, resp. rate 18, height _0  (1.803 m), weight 95.3 kg (210 lb), SpO2 93 %. Physical Exam  Constitutional: He is oriented to person, place, and time. He appears well-developed and well-nourished. No distress.  HENT:  Head: Normocephalic and atraumatic.   Right Ear: External ear normal.  Left Ear: External ear normal.  Nose: Nose normal.  Mouth/Throat: Oropharynx is clear and moist.  Eyes: Pupils are equal, round, and reactive to light. Conjunctivae are normal. Right eye exhibits no discharge. Left eye exhibits no discharge. No scleral icterus.  Neck: Normal range of motion. No tracheal deviation present.  Cardiovascular: Normal rate, regular rhythm and intact distal pulses.  Murmur heard. Respiratory: Effort normal and breath sounds normal. No respiratory distress. He has  no wheezes.  GI: Soft. He exhibits no distension. There is tenderness. There is guarding.  There is mild to moderate tenderness with some guarding in the right upper quadrant  Musculoskeletal: Normal range of motion. He exhibits no edema.  Neurological: He is alert and oriented to person, place, and time.  Skin: Skin is dry. He is not diaphoretic. No erythema.  Psychiatric: His behavior is normal. Judgment normal.    Assessment/Plan: Patient with acute cholecystitis with cholelithiasis.  Given his white blood count of 18,000 and elevated lactic acid level, he has been admitted for a septic workup.  His bilirubin is elevated but the rest of his liver function tests are normal.  This may be just from dehydration.  He is currently being resuscitated.  He will need a preoperative cardiac evaluation to determine his risk for general anesthesia.  We will also discussed in the morning whether or not he would instead benefit from a percutaneous cholecystostomy instead of a laparoscopic cholecystectomy depending on his status.  BLACKMAN,DOUGLAS A 11/24/2017, 1:15 AM

## 2017-11-24 NOTE — Consult Note (Signed)
Chief Complaint: Patient was seen in consultation today for acute cholecystitis  Referring Physician(s): Dr. Dalbert Batman  Supervising Physician: Marybelle Killings  Patient Status: [facility] - In-pt  History of Present Illness: [name] is a 77 y.o. male with past medical history of alcohol abuse, CKD, DM, HTN, and solitary kidney who presented to [facility] ED with RUQ pain.    US Abdomen 3/28: 1. Cholelithiasis and gallbladder wall thickening with trace pericholecystic fluid are strongly suggestive of Acute Cholecystitis. Individual gallstone size estimated at 20 mm. 2. CBD at the upper limits of normal. 3. Several small benign hepatic lesions are incidentally noted.  Patient has been evaluated for cholecystectomy and determined to be high risk for surgery.  IR consulted for percutaneous cholecystostomy tube placement at the request of Dr. Dalbert Batman.  Case reviewed and approved by Dr. Barbie Banner.   Past Medical History:  Diagnosis Date  . Alcohol abuse 02/26/2015  . Chronic combined systolic and diastolic CHF (congestive heart failure) ([place])   . CKD (chronic kidney disease), stage III ([place])   . Demand ischemia ([place])    a. minimally elevated troponin peak 0.11 in 2016, felt demand ischemia. Cath offered but pt wished to have done back in [place].  . Diabetes mellitus without complication ([place])   . Essential hypertension   . Hyperlipidemia   . Hypertension   . Solitary kidney     History reviewed. No pertinent surgical history.  Allergies: Patient has no known allergies.  Medications: Prior to Admission medications   Medication Sig Start Date End Date Taking? Authorizing Provider  acetaminophen (TYLENOL) 500 MG tablet Take 1,000 mg by mouth every 6 (six) hours as needed for moderate pain or headache.    [provider]  aspirin EC 81 MG tablet Take 81 mg by mouth daily.    [provider]  folic acid (FOLVITE) 1 MG tablet Take 1 tablet (1 mg total) by mouth daily. Patient not  taking: Reported on 09/24/2016 02/27/15   Modena Jansky, MD  furosemide (LASIX) 20 MG tablet Take 1 tablet (20 mg total) by mouth daily. Patient not taking: Reported on 09/24/2016 02/27/15   Modena Jansky, MD  HYDROcodone-acetaminophen (NORCO) 5-325 MG tablet Take 1-2 tablets by mouth every 6 (six) hours as needed for moderate pain. 09/24/16   Harris, Vernie Shanks, PA-C  lisinopril (PRINIVIL,ZESTRIL) 10 MG tablet Take 1 tablet (10 mg total) by mouth daily. Patient not taking: Reported on 09/24/2016 02/27/15   Modena Jansky, MD  Multiple Vitamin (MULTIVITAMIN WITH MINERALS) TABS tablet Take 1 tablet by mouth daily. Patient not taking: Reported on 09/24/2016 02/27/15   Modena Jansky, MD  thiamine 100 MG tablet Take 1 tablet (100 mg total) by mouth daily. Patient not taking: Reported on 09/24/2016 02/27/15   Modena Jansky, MD     Family History  Problem Relation Age of Onset  . Other Mother        died in her sleep at 10  . CAD Neg Hx        neg hx premature CAD    Social History   Socioeconomic History  . Marital status: Married    Spouse name: Not on file  . Number of children: Not on file  . Years of education: Not on file  . Highest education level: Not on file  Occupational History  . Not on file  Social Needs  . Financial resource strain: Not on file  . Food insecurity:    Worry: Not  on file    Inability: Not on file  . Transportation needs:    Medical: Not on file    Non-medical: Not on file  Tobacco Use  . Smoking status: Former Smoker    Types: Cigarettes    Last attempt to quit: 02/26/2008    Years since quitting: 9.7  . Smokeless tobacco: Never Used  Substance and Sexual Activity  . Alcohol use: Yes    Comment: occ  . Drug use: No  . Sexual activity: Not on file  Lifestyle  . Physical activity:    Days per week: Not on file    Minutes per session: Not on file  . Stress: Not on file  Relationships  . Social connections:    Talks on phone: Not on file      Gets together: Not on file    Attends religious service: Not on file    Active member of club or organization: Not on file    Attends meetings of clubs or organizations: Not on file    Relationship status: Not on file  Other Topics Concern  . Not on file  Social History Narrative  . Not on file     Review of Systems: A 12 point ROS discussed and pertinent positives are indicated in the HPI above.  All other systems are negative.  Review of Systems  Constitutional: Positive for fever. Negative for fatigue.  Respiratory: Negative for cough and shortness of breath.   Cardiovascular: Negative for chest pain.  Gastrointestinal: Positive for abdominal pain.  Psychiatric/Behavioral: Negative for behavioral problems and confusion.    Vital Signs: BP 140/70 (BP Location: Right Arm)   Pulse 96   Temp 98.2 F (36.8 C) (Oral)   Resp 18   Ht 5\' 11"  (1.803 m)   Wt 213 lb 1.6 oz (96.7 kg)   SpO2 99%   BMI 29.72 kg/m   Physical Exam  Constitutional: He is oriented to person, place, and time. He appears well-developed.  Cardiovascular: Normal rate, regular rhythm and normal heart sounds.  Pulmonary/Chest: Effort normal and breath sounds normal. No respiratory distress.  Abdominal: Normal appearance and bowel sounds are normal. He exhibits no distension. There is tenderness (RUQ).  Neurological: He is alert and oriented to person, place, and time.  Skin: Skin is warm and dry.  Psychiatric: He has a normal mood and affect. His behavior is normal.  Nursing note and vitals reviewed.     Imaging: Ct Abdomen Pelvis W Contrast  Result Date: 11/23/2017 CLINICAL DATA:  Lower abdominal pain for 3 days. History of cholelithiasis. EXAM: CT ABDOMEN AND PELVIS WITH CONTRAST TECHNIQUE: Multidetector CT imaging of the abdomen and pelvis was performed using the standard protocol following bolus administration of intravenous contrast. CONTRAST:  85 cc ISOVUE-300 IOPAMIDOL (ISOVUE-300) INJECTION 61%  COMPARISON:  Renal ultrasound February 26, 2015 FINDINGS: LOWER CHEST: Lung bases are clear. The heart is mildly enlarged. No pericardial effusion. HEPATOBILIARY: Multiple calcified gallstones in layering tiny gallstones versus sludge. Gallbladder wall thickening pericholecystic fat stranding and small volume fluid. PANCREAS: Normal. SPLEEN: Normal. ADRENALS/URINARY TRACT: Kidneys are orthotopic, demonstrating symmetric enhancement. No nephrolithiasis, hydronephrosis. Ill-defined 2.4 x 1.8 cm hypodensity lower pole RIGHT kidney on early phase, slightly larger appearing on delayed phase. Too small to characterize hypodensities bilateral kidneys. The unopacified ureters are normal in course and caliber. Delayed imaging through the kidneys demonstrates symmetric prompt contrast excretion within the proximal urinary collecting system. Urinary bladder is partially distended and unremarkable. Normal adrenal glands. STOMACH/BOWEL:  Upper small hiatal hernia. The stomach, small and large bowel are normal in course and caliber without inflammatory changes. Severe sigmoid colonic diverticulosis. Normal appendix. VASCULAR/LYMPHATIC: Aortoiliac vessels are normal in course and caliber. Mild calcific atherosclerosis. No lymphadenopathy by CT size criteria. REPRODUCTIVE: Prostatomegaly invading base of bladder. OTHER: No focal fluid collection. Small volume free fluid in the pelvis. No intraperitoneal free air. Moderate fat containing umbilical hernia. MUSCULOSKELETAL: Nonacute. Mild degenerative change of the lumbar spine. IMPRESSION: 1. Cholelithiasis and acute cholecystitis. 2. RIGHT lower pole ill-defined hypodensity concerning for pyelonephritis, less likely infarct. Recommend follow-up after resolution of acute symptoms to exclude underlying mass. 3. Severe colonic diverticulosis. Aortic Atherosclerosis (ICD10-I70.0). Electronically Signed   By: Elon Alas M.D.   On: 11/23/2017 18:31   US Abdomen Limited Ruq  Result  Date: 11/23/2017 CLINICAL DATA:  77 year old male with cholelithiasis and CT evidence of acute cholecystitis today. Abdominal pain. EXAM: ULTRASOUND ABDOMEN LIMITED RIGHT UPPER QUADRANT COMPARISON:  CT Abdomen and Pelvis 1806 hours. FINDINGS: Gallbladder: Multiple gallstones individually estimated up to 20 millimeters diameter. Positive gallbladder wall thickening of 5-7 millimeters (image 13). Evidence of trace pericholecystic fluid. Equivocal sonographic Murphy sign. Common bile duct: Diameter: 6 millimeters, upper limits of normal. Liver: Small round 18 millimeter echogenic focus in the central right hepatic lobe on image 52 is most compatible with a small benign hemangioma. There is a small simple appearing hepatic cyst in the left lobe measuring 8 millimeters (image 76). Background liver echogenicity is at the upper limits of normal. Portal vein is patent on color Doppler imaging with normal direction of blood flow towards the liver. Other findings: Negative visible right kidney. IMPRESSION: 1. Cholelithiasis and gallbladder wall thickening with trace pericholecystic fluid are strongly suggestive of Acute Cholecystitis. Individual gallstone size estimated at 20 mm. 2. CBD at the upper limits of normal. 3. Several small benign hepatic lesions are incidentally noted. Electronically Signed   By: Genevie Ann M.D.   On: 11/23/2017 22:24    Labs:  CBC: Recent Labs    11/23/17 1032 11/24/17 0230  WBC 18.9* 16.3*  HGB 13.3 11.3*  HCT 40.5 35.0*  PLT 313 249    COAGS: Recent Labs    11/23/17 2229  INR 1.27  APTT 35    BMP: Recent Labs    11/23/17 1032 11/24/17 0230  NA 134* 135  K 4.3 4.0  CL 100* 102  CO2 20* 24  GLUCOSE 167* 142*  BUN 14 19  CALCIUM 9.6 8.4*  CREATININE 1.65* 1.92*  GFRNONAA 39* 33*  GFRAA 46* 38*    LIVER FUNCTION TESTS: Recent Labs    11/23/17 1032 11/24/17 0230  BILITOT 1.5* 1.5*  AST 26 18  ALT 19 15*  ALKPHOS 67 53  PROT 8.8* 7.3  ALBUMIN 3.9 3.1*     TUMOR MARKERS: No results for input(s): AFPTM, CEA, CA199, CHROMGRNA in the last 8760 hours.  Assessment and Plan: Acute Cholecystitis Patient with abdominal pain and US Abdomen suggestive of acute cholecystitis.  Patient evaluated by surgery and cardiology for possible cholecystectomy and determined to be high risk for surgery.  IR consulted for percutaneous cholecystostomy tube placement at the request of Dr. Dalbert Batman.  Patient currently afebrile with elevated WBC.  VSS.  Plan for cholecystomy tube placement tomorrow in IR.  NPO after midnight.  Hold thinners if applicable.   Thank you for this interesting consult.  I greatly enjoyed meeting [employer] and look forward to participating in their care.  A copy  of this report was sent to the requesting provider on this date.  Electronically Signed: Docia Barrier, PA 11/24/2017, 3:49 PM   I spent a total of 40 Minutes    in face to face in clinical consultation, greater than 50% of which was counseling/coordinating care for acute cholecystitis.

## 2017-11-24 NOTE — Plan of Care (Signed)

## 2017-11-25 ENCOUNTER — Encounter (HOSPITAL_COMMUNITY): Payer: Self-pay | Admitting: Interventional Radiology

## 2017-11-25 ENCOUNTER — Inpatient Hospital Stay (HOSPITAL_COMMUNITY): Payer: Medicare Other

## 2017-11-25 DIAGNOSIS — I509 Heart failure, unspecified: Secondary | ICD-10-CM

## 2017-11-25 HISTORY — PX: IR PERC CHOLECYSTOSTOMY: IMG2326

## 2017-11-25 LAB — CBC
HCT: 31.6 % — ABNORMAL LOW (ref 39.0–52.0)
Hemoglobin: 10.1 g/dL — ABNORMAL LOW (ref 13.0–17.0)
MCH: 30.4 pg (ref 26.0–34.0)
MCHC: 32 g/dL (ref 30.0–36.0)
MCV: 95.2 fL (ref 78.0–100.0)
Platelets: 243 10*3/uL (ref 150–400)
RBC: 3.32 MIL/uL — ABNORMAL LOW (ref 4.22–5.81)
RDW: 15.1 % (ref 11.5–15.5)
WBC: 11.9 10*3/uL — ABNORMAL HIGH (ref 4.0–10.5)

## 2017-11-25 LAB — COMPREHENSIVE METABOLIC PANEL
ALT: 15 U/L — ABNORMAL LOW (ref 17–63)
AST: 18 U/L (ref 15–41)
Albumin: 2.9 g/dL — ABNORMAL LOW (ref 3.5–5.0)
Alkaline Phosphatase: 52 U/L (ref 38–126)
Anion gap: 8 (ref 5–15)
BUN: 24 mg/dL — ABNORMAL HIGH (ref 6–20)
CO2: 25 mmol/L (ref 22–32)
Calcium: 8.3 mg/dL — ABNORMAL LOW (ref 8.9–10.3)
Chloride: 103 mmol/L (ref 101–111)
Creatinine, Ser: 2.06 mg/dL — ABNORMAL HIGH (ref 0.61–1.24)
GFR calc Af Amer: 35 mL/min — ABNORMAL LOW (ref >60)
GFR calc non Af Amer: 30 mL/min — ABNORMAL LOW (ref >60)
Glucose, Bld: 99 mg/dL (ref 65–99)
Potassium: 3.7 mmol/L (ref 3.5–5.1)
Sodium: 136 mmol/L (ref 135–145)
Total Bilirubin: 2.3 mg/dL — ABNORMAL HIGH (ref 0.3–1.2)
Total Protein: 7 g/dL (ref 6.5–8.1)

## 2017-11-25 LAB — ECHOCARDIOGRAM COMPLETE
Height: 71 in
Weight: 3400.38 oz

## 2017-11-25 LAB — URINE CULTURE: Culture: <10000 — AB

## 2017-11-25 LAB — GLUCOSE, CAPILLARY: Glucose-Capillary: 101 mg/dL — ABNORMAL HIGH (ref 65–99)

## 2017-11-25 MED ORDER — FENTANYL CITRATE (PF) 100 MCG/2ML IJ SOLN
INTRAMUSCULAR | Status: AC | PRN
  Administered 2017-11-25 (×2): 50 ug via INTRAVENOUS

## 2017-11-25 MED ORDER — CARVEDILOL 12.5 MG PO TABS
12.5000 mg | ORAL_TABLET | Freq: Two times a day (BID) | ORAL | Status: DC
  Administered 2017-11-25 – 2017-11-26 (×2): 12.5 mg via ORAL
  Filled 2017-11-25 (×2): qty 1

## 2017-11-25 MED ORDER — MIDAZOLAM HCL 2 MG/2ML IJ SOLN
INTRAMUSCULAR | Status: AC | PRN
  Administered 2017-11-25 (×2): 1 mg via INTRAVENOUS
  Administered 2017-11-25 (×2): 0.5 mg via INTRAVENOUS

## 2017-11-25 MED ORDER — FENTANYL CITRATE (PF) 100 MCG/2ML IJ SOLN
INTRAMUSCULAR | Status: AC
  Filled 2017-11-25: qty 2

## 2017-11-25 MED ORDER — SODIUM CHLORIDE 0.9% FLUSH
5.0000 mL | Freq: Three times a day (TID) | INTRAVENOUS | Status: DC
  Administered 2017-11-25 – 2017-11-26 (×4): 5 mL

## 2017-11-25 MED ORDER — LIDOCAINE HCL 1 % IJ SOLN
INTRAMUSCULAR | Status: AC
  Filled 2017-11-25: qty 20

## 2017-11-25 MED ORDER — IOPAMIDOL (ISOVUE-300) INJECTION 61%
INTRAVENOUS | Status: AC
  Administered 2017-11-25: 1 mL
  Filled 2017-11-25: qty 50

## 2017-11-25 MED ORDER — MIDAZOLAM HCL 2 MG/2ML IJ SOLN
INTRAMUSCULAR | Status: AC
  Filled 2017-11-25: qty 2

## 2017-11-25 MED ORDER — LIDOCAINE HCL (PF) 1 % IJ SOLN
INTRAMUSCULAR | Status: AC | PRN
  Administered 2017-11-25: 5 mL

## 2017-11-25 NOTE — Progress Notes (Signed)
Subjective: Alert.  Comfortable.  Denies pain. Knows he's going to have a percutaneous gallbladder drain placed today  Greatly appreciate cardiology evaluation.  Due to chronic systolic heart failure,CK D, alcohol abuse a also favor percutaneous drainage over general anesthesia for cholecystectomy.  Labs pending  Objective: Vital signs in last 24 hours: Temp:  [98.2 F (36.8 C)-99.3 F (37.4 C)] 99.2 F (37.3 C) (03/29 0442) Pulse Rate:  [91-97] 97 (03/29 0442) Resp:  [18-20] 20 (03/29 0442) BP: (108-152)/(70-81) 152/73 (03/29 0442) SpO2:  [97 %-100 %] 99 % (03/29 0442) Weight:  [96.4 kg (212 lb 8.4 oz)-96.7 kg (213 lb 1.6 oz)] 96.4 kg (212 lb 8.4 oz) (03/29 0224) Last BM Date: 11/22/17  Intake/Output from previous day: 03/28 0701 - 03/29 0700 In: 1926.3 [P.O.:300; I.V.:1426.3; IV Piggyback:200] Out: 0  Intake/Output this shift: Total I/O In: 1926.3 [P.O.:300; I.V.:1426.3; IV Piggyback:200] Out: 0    PE General appearance: Alert.  Cooperative.  No distress.  Mental status normal Resp: clear to auscultation bilaterally GI: Abdomen is soft and nontender.  Not distended.  no mass.  Perhaps minimal tenderness to percuss right costal margin.No scars.  Small umbilical hernia     Lab Results:  Recent Labs    11/23/17 1032 11/24/17 0230  WBC 18.9* 16.3*  HGB 13.3 11.3*  HCT 40.5 35.0*  PLT 313 249   BMET Recent Labs    11/23/17 1032 11/24/17 0230  NA 134* 135  K 4.3 4.0  CL 100* 102  CO2 20* 24  GLUCOSE 167* 142*  BUN 14 19  CREATININE 1.65* 1.92*  CALCIUM 9.6 8.4*   PT/INR Recent Labs    11/23/17 2229  LABPROT 15.8*  INR 1.27   ABG No results for input(s): PHART, HCO3 in the last 72 hours.  Invalid input(s): PCO2, PO2  Studies/Results: Ct Abdomen Pelvis W Contrast  Result Date: 11/23/2017 CLINICAL DATA:  Lower abdominal pain for 3 days. History of cholelithiasis. EXAM: CT ABDOMEN AND PELVIS WITH CONTRAST TECHNIQUE: Multidetector CT imaging of  the abdomen and pelvis was performed using the standard protocol following bolus administration of intravenous contrast. CONTRAST:  85 cc ISOVUE-300 IOPAMIDOL (ISOVUE-300) INJECTION 61% COMPARISON:  Renal ultrasound February 26, 2015 FINDINGS: LOWER CHEST: Lung bases are clear. The heart is mildly enlarged. No pericardial effusion. HEPATOBILIARY: Multiple calcified gallstones in layering tiny gallstones versus sludge. Gallbladder wall thickening pericholecystic fat stranding and small volume fluid. PANCREAS: Normal. SPLEEN: Normal. ADRENALS/URINARY TRACT: Kidneys are orthotopic, demonstrating symmetric enhancement. No nephrolithiasis, hydronephrosis. Ill-defined 2.4 x 1.8 cm hypodensity lower pole RIGHT kidney on early phase, slightly larger appearing on delayed phase. Too small to characterize hypodensities bilateral kidneys. The unopacified ureters are normal in course and caliber. Delayed imaging through the kidneys demonstrates symmetric prompt contrast excretion within the proximal urinary collecting system. Urinary bladder is partially distended and unremarkable. Normal adrenal glands. STOMACH/BOWEL: Upper small hiatal hernia. The stomach, small and large bowel are normal in course and caliber without inflammatory changes. Severe sigmoid colonic diverticulosis. Normal appendix. VASCULAR/LYMPHATIC: Aortoiliac vessels are normal in course and caliber. Mild calcific atherosclerosis. No lymphadenopathy by CT size criteria. REPRODUCTIVE: Prostatomegaly invading base of bladder. OTHER: No focal fluid collection. Small volume free fluid in the pelvis. No intraperitoneal free air. Moderate fat containing umbilical hernia. MUSCULOSKELETAL: Nonacute. Mild degenerative change of the lumbar spine. IMPRESSION: 1. Cholelithiasis and acute cholecystitis. 2. RIGHT lower pole ill-defined hypodensity concerning for pyelonephritis, less likely infarct. Recommend follow-up after resolution of acute symptoms to exclude underlying  mass.  3. Severe colonic diverticulosis. Aortic Atherosclerosis (ICD10-I70.0). Electronically Signed   By: Elon Alas M.D.   On: 11/23/2017 18:31   US Abdomen Limited Ruq  Result Date: 11/23/2017 CLINICAL DATA:  77 year old male with cholelithiasis and CT evidence of acute cholecystitis today. Abdominal pain. EXAM: ULTRASOUND ABDOMEN LIMITED RIGHT UPPER QUADRANT COMPARISON:  CT Abdomen and Pelvis 1806 hours. FINDINGS: Gallbladder: Multiple gallstones individually estimated up to 20 millimeters diameter. Positive gallbladder wall thickening of 5-7 millimeters (image 13). Evidence of trace pericholecystic fluid. Equivocal sonographic Murphy sign. Common bile duct: Diameter: 6 millimeters, upper limits of normal. Liver: Small round 18 millimeter echogenic focus in the central right hepatic lobe on image 52 is most compatible with a small benign hemangioma. There is a small simple appearing hepatic cyst in the left lobe measuring 8 millimeters (image 76). Background liver echogenicity is at the upper limits of normal. Portal vein is patent on color Doppler imaging with normal direction of blood flow towards the liver. Other findings: Negative visible right kidney. IMPRESSION: 1. Cholelithiasis and gallbladder wall thickening with trace pericholecystic fluid are strongly suggestive of Acute Cholecystitis. Individual gallstone size estimated at 20 mm. 2. CBD at the upper limits of normal. 3. Several small benign hepatic lesions are incidentally noted. Electronically Signed   By: Genevie Ann M.D.   On: 11/23/2017 22:24    Anti-infectives: Anti-infectives (From admission, onward)   Start     Dose/Rate Route Frequency Ordered Stop   11/24/17 0200  piperacillin-tazobactam (ZOSYN) IVPB 3.375 g     3.375 g 12.5 mL/hr over 240 Minutes Intravenous Every 8 hours 11/23/17 2119     11/23/17 1845  piperacillin-tazobactam (ZOSYN) IVPB 3.375 g     3.375 g 100 mL/hr over 30 Minutes Intravenous  Once 11/23/17 1836  11/23/17 2105      Assessment/Plan:  Acute cholecystitis with cholelithiasis.  Significant symptomatic improvement on antibiotics and bowel rest.  Still, gallbladder is likely source of sepsis  Right renal lesion.  Question pyelonephritis versus infarct vs. Incidental.  Not really symptomatic check urinalysis.... with proteinuria but does not look infected.  Chronic systolic CHF...somewhat noncompliant and not followed by cardiology in [place]. Needs formal cardiology evaluation this morning. EF 25% at best. CKD stage III Hypertension  alcohol  Abuse--no agitation or signs of withdrawal at this hour   Plan: -percutaneous cholecystostomy today -continue antibiotics-Zosyn --If drainage procedure goes well he can resume liquid diet  -the long-term question is whether his medical and cardiac situation can be improved to the point that he can undergo cholecystectomy in the future.  That is unclear to me at this moment       LOS: 2 days    Adin Hector 11/25/2017

## 2017-11-25 NOTE — Progress Notes (Signed)
         [facility]                                                                              Patient Demographics  [name], is a 76 y.o. male, DOB - 12/04/1940, MRN:[id number]  Admit date - 11/23/2017   Admitting Physician Timothy S Opyd, MD  Outpatient Primary MD for the patient is Patient, No Pcp Per  Outpatient specialists:   LOS - 2  days   Medical records reviewed and are as summarized below:    Chief Complaint  Patient presents with  . Abdominal Pain       Brief summary   Patient is a 76-year-old male with chronic systolic CHF, EF 25-30%, CKD stage III, hypertension, alcohol use presented to ED with 2 days of severe abdominal pain.  Reported sharp, intermittent in mid and upper abdomen for last 2 days, not been eating or drinking since onset.  Denied any subjective fevers, any vomiting.  In ED, septic with leukocytosis 18.9, lactic acidosis 3.0, febrile 102.8 F, tachycardiac 110-128. CT abdomen pelvis showed cholelithiasis with acute cholecystitis, right lower pole ill-defined hypodensity concerning for pyelonephritis, less likely infarct, recommend follow-up after the resolution of acute symptoms to exclude any underlying mass.   Assessment & Plan    Principal Problem:   Acute cholecystitis with sepsis - met sepsis criteria in ED on admission with leukocytosis 18.9, lactic acidosis 3.0, febrile 102.8 F, tachycardiac 110-128.  Source likely due to acute cholecystitis -Right upper quadrant ultrasound showed cholelithiasis and gallbladder wall thickening with trace pericholecystic fluid suggestive of acute cholecystitis. -Appreciate general surgery and IR assistance, percutaneous chole drain placed today - will resume clear liquid diet, leukocytosis and lactic acidosis improving  Active Problems:   Alcohol use -Currently stable, no withdrawals, continue Ativan with CIWA -Continue gentle hydration, thiamine, folate  Acute on CKD (chronic kidney  disease), stage III (HCC) -Baseline creatinine 1.4-1.7 -Creatinine trending up to 2.0 today, likely due to #1, continue IV fluid hydration, hold Lasix or ACE inhibitor.     Chronic systolic CHF (congestive heart failure) (HCC) -Currently euvolemic, 2D echo in 2016 showed EF of 25 to 30%.  Reports that he does not take prescribed Lasix or ACE inhibitor. -Continue cautious IV fluids, cardiology consult has been obtained from preop cardiac evaluation -Follow 2D echo    Essential hypertension -BP trending up, increase Coreg to 12.5 mg twice a day.  No ACE inhibitor due to renal insufficiency  Right renal lesion -CT abdomen and pelvis incidentally showed right lower lobe ill-defined density possibly pyelonephritis, less likely infarct, rule out mass once acute symptoms are resolved.  Patient has no urinary symptoms.  UA negative, no UTI or hematuria. -Recommend outpatient follow-up by PCP or renal ultrasound prior to discharge.   Code Status: full  DVT Prophylaxis: SCD's Family Communication: Discussed in detail with the patient, all imaging results, lab results explained to the patient    Disposition Plan: DC possibly in next 24-48 hours  Time Spent in minutes 25 minutes  Procedures:   Consultants:   General surgery Cardiology  Antimicrobials:   IV Zosyn 3/27   Medications  Scheduled Meds: .   carvedilol  6.25 mg Oral BID WC  . fentaNYL      . folic acid  1 mg Oral Daily  . lidocaine      . LORazepam  0-4 mg Intravenous Q6H   Followed by  . [START ON 11/26/2017] LORazepam  0-4 mg Intravenous Q12H  . midazolam      . midazolam      . multivitamin with minerals  1 tablet Oral Daily  . sodium chloride flush  3 mL Intravenous Q12H  . sodium chloride flush  5 mL Intracatheter Q8H  . thiamine  100 mg Oral Daily   Or  . thiamine  100 mg Intravenous Daily   Continuous Infusions: . sodium chloride 75 mL/hr at 11/25/17 0006  . piperacillin-tazobactam (ZOSYN)  IV 3.375 g  (11/25/17 1009)   PRN Meds:.acetaminophen **OR** acetaminophen, hydrALAZINE, LORazepam **OR** LORazepam, morphine injection, ondansetron **OR** ondansetron (ZOFRAN) IV   Antibiotics   Anti-infectives (From admission, onward)   Start     Dose/Rate Route Frequency Ordered Stop   11/24/17 0200  piperacillin-tazobactam (ZOSYN) IVPB 3.375 g     3.375 g 12.5 mL/hr over 240 Minutes Intravenous Every 8 hours 11/23/17 2119     11/23/17 1845  piperacillin-tazobactam (ZOSYN) IVPB 3.375 g     3.375 g 100 mL/hr over 30 Minutes Intravenous  Once 11/23/17 1836 11/23/17 2105        Subjective:   [name] was seen and examined today.  Returned from percutaneous Coley tube placement, feels a lot better, no nausea or vomiting, no fevers.  Patient denies dizziness, chest pain, shortness of breath, new weakness, numbess, tingling. No acute events overnight.    Objective:   Vitals:   11/25/17 0859 11/25/17 0910 11/25/17 0915 11/25/17 0920  BP: (!) 166/81 (!) 155/77 (!) 155/81 (!) 146/79  Pulse: 99 (!) 102 (!) 102 100  Resp: (!) 22 (!) 21 20 (!) 21  Temp:      TempSrc:      SpO2: 99% 97% 94% 96%  Weight:      Height:        Intake/Output Summary (Last 24 hours) at 11/25/2017 1440 Last data filed at 11/25/2017 9390 Gross per 24 hour  Intake 2226.25 ml  Output 0 ml  Net 2226.25 ml     Wt Readings from Last 3 Encounters:  11/25/17 96.4 kg (212 lb 8.4 oz)  09/24/16 97.1 kg (214 lb)     Exam General: Alert and oriented x 3, NAD Eyes:  HEENT:  Atraumatic, normocephalic Cardiovascular: Z0-S9 clear, RRR Respiratory: Clear to auscultation bilaterally, no wheezing, rales or rhonchi Gastrointestinal: Soft, + perc chole tube, + bowel sounds Ext: no pedal edema bilaterally Neuro: no new deficits Musculoskeletal: No digital cyanosis, clubbing Skin: No rashes  Psych: Normal affect and demeanor, alert and oriented x3   Data Reviewed:  I have personally reviewed following labs and  imaging studies  Micro Results Recent Results (from the past 240 hour(s))  Culture, blood (routine x 2)     Status: None (Preliminary result)   Collection Time: 11/23/17  5:10 PM  Result Value Ref Range Status   Specimen Description BLOOD RIGHT HAND  Final   Special Requests   Final    BOTTLES DRAWN AEROBIC AND ANAEROBIC Blood Culture adequate volume   Culture   Final    NO GROWTH < 24 HOURS Performed at [facility], [address] [place], [place] [address]    Report Status PENDING  Incomplete  Culture, blood (routine x 2)     Status: None (Preliminary result)   Collection Time: 11/23/17  5:19 PM  Result Value Ref Range Status   Specimen Description BLOOD LEFT WRIST  Final   Special Requests   Final    BOTTLES DRAWN AEROBIC AND ANAEROBIC Blood Culture adequate volume   Culture   Final    NO GROWTH < 24 HOURS Performed at [facility], [address] [place], [place] [address]    Report Status PENDING  Incomplete  Urine culture     Status: Abnormal   Collection Time: 11/23/17  9:46 PM  Result Value Ref Range Status   Specimen Description URINE, RANDOM  Final   Special Requests NONE  Final   Culture (A)  Final    <10,000 COLONIES/mL INSIGNIFICANT GROWTH Performed at [facility], [address] [place], [place] [address]    Report Status 11/25/2017 FINAL  Final  Aerobic/Anaerobic Culture (surgical/deep wound)     Status: None (Preliminary result)   Collection Time: 11/25/17 10:07 AM  Result Value Ref Range Status   Specimen Description BILE  Final   Special Requests NONE  Final   Gram Stain   Final    FEW WBC PRESENT, PREDOMINANTLY MONONUCLEAR RARE GRAM POSITIVE COCCI IN PAIRS AND CHAINS Performed at [facility], [address] , [place] [address]    Culture PENDING  Incomplete   Report Status PENDING  Incomplete    Radiology Reports Ct Abdomen Pelvis W Contrast  Result Date: 11/23/2017 CLINICAL DATA:  Lower abdominal pain for 3  days. History of cholelithiasis. EXAM: CT ABDOMEN AND PELVIS WITH CONTRAST TECHNIQUE: Multidetector CT imaging of the abdomen and pelvis was performed using the standard protocol following bolus administration of intravenous contrast. CONTRAST:  85 cc ISOVUE-300 IOPAMIDOL (ISOVUE-300) INJECTION 61% COMPARISON:  Renal ultrasound February 26, 2015 FINDINGS: LOWER CHEST: Lung bases are clear. The heart is mildly enlarged. No pericardial effusion. HEPATOBILIARY: Multiple calcified gallstones in layering tiny gallstones versus sludge. Gallbladder wall thickening pericholecystic fat stranding and small volume fluid. PANCREAS: Normal. SPLEEN: Normal. ADRENALS/URINARY TRACT: Kidneys are orthotopic, demonstrating symmetric enhancement. No nephrolithiasis, hydronephrosis. Ill-defined 2.4 x 1.8 cm hypodensity lower pole RIGHT kidney on early phase, slightly larger appearing on delayed phase. Too small to characterize hypodensities bilateral kidneys. The unopacified ureters are normal in course and caliber. Delayed imaging through the kidneys demonstrates symmetric prompt contrast excretion within the proximal urinary collecting system. Urinary bladder is partially distended and unremarkable. Normal adrenal glands. STOMACH/BOWEL: Upper small hiatal hernia. The stomach, small and large bowel are normal in course and caliber without inflammatory changes. Severe sigmoid colonic diverticulosis. Normal appendix. VASCULAR/LYMPHATIC: Aortoiliac vessels are normal in course and caliber. Mild calcific atherosclerosis. No lymphadenopathy by CT size criteria. REPRODUCTIVE: Prostatomegaly invading base of bladder. OTHER: No focal fluid collection. Small volume free fluid in the pelvis. No intraperitoneal free air. Moderate fat containing umbilical hernia. MUSCULOSKELETAL: Nonacute. Mild degenerative change of the lumbar spine. IMPRESSION: 1. Cholelithiasis and acute cholecystitis. 2. RIGHT lower pole ill-defined hypodensity concerning for  pyelonephritis, less likely infarct. Recommend follow-up after resolution of acute symptoms to exclude underlying mass. 3. Severe colonic diverticulosis. Aortic Atherosclerosis (ICD10-I70.0). Electronically Signed   By: Courtnay  Bloomer M.D.   On: 11/23/2017 18:31   Ir Perc Cholecystostomy  Result Date: 11/25/2017 INDICATION: Acute calculus cholecystitis EXAM: ULTRASOUND AND FLUOROSCOPIC TRANSHEPATIC CHOLECYSTOSTOMY MEDICATIONS: 3.375 G ZOSYN; The antibiotic was administered within an appropriate time frame prior to   the initiation of the procedure. ANESTHESIA/SEDATION: Moderate (conscious) sedation was employed during this procedure. A total of Versed 1 mg and Fentanyl 50 mcg was administered intravenously. Moderate Sedation Time: 15 minutes. The patient's level of consciousness and vital signs were monitored continuously by radiology nursing throughout the procedure under my direct supervision. FLUOROSCOPY TIME:  Fluoroscopy Time: 24 seconds (1.9 mGy). COMPLICATIONS: None immediate. PROCEDURE: Informed written consent was obtained from the patient after a thorough discussion of the procedural risks, benefits and alternatives. All questions were addressed. Maximal Sterile Barrier Technique was utilized including caps, mask, sterile gowns, sterile gloves, sterile drape, hand hygiene and skin antiseptic. A timeout was performed prior to the initiation of the procedure. previous imaging reviewed. preliminary ultrasound performed. the dilated gallbladder with wall thickening and gallstones was localized in the right upper quadrant just beneath the right subcostal margin. overlying skin marked. Under sterile conditions and local anesthesia, ultrasound micro needle access performed of the dilated gallbladder through a transhepatic window. Needle position confirmed with ultrasound. Images obtained for documentation. Guidewire advanced followed by the Accustick dilator set. This allowed insertion of an Amplatz  guidewire followed by tract dilatation to insert a 10 [place] drain. Drain catheter position confirmed with ultrasound and fluoroscopy. Images obtained for documentation. Syringe aspiration yielded exudative bile. Sample sent for culture. Catheter secured with a Prolene suture and connected to external gravity drainage bag. Sterile dressing applied. No immediate complication. Patient tolerated the procedure well. IMPRESSION: Successful ultrasound fluoroscopic 10 French transhepatic cholecystostomy. Electronically Signed   By: [name].  Shick M.D.   On: 11/25/2017 09:54   US Abdomen Limited Ruq  Result Date: 11/23/2017 CLINICAL DATA:  77 year old male with cholelithiasis and CT evidence of acute cholecystitis today. Abdominal pain. EXAM: ULTRASOUND ABDOMEN LIMITED RIGHT UPPER QUADRANT COMPARISON:  CT Abdomen and Pelvis 1806 hours. FINDINGS: Gallbladder: Multiple gallstones individually estimated up to 20 millimeters diameter. Positive gallbladder wall thickening of 5-7 millimeters (image 13). Evidence of trace pericholecystic fluid. Equivocal sonographic Murphy sign. Common bile duct: Diameter: 6 millimeters, upper limits of normal. Liver: Small round 18 millimeter echogenic focus in the central right hepatic lobe on image 52 is most compatible with a small benign hemangioma. There is a small simple appearing hepatic cyst in the left lobe measuring 8 millimeters (image 76). Background liver echogenicity is at the upper limits of normal. Portal vein is patent on color Doppler imaging with normal direction of blood flow towards the liver. Other findings: Negative visible right kidney. IMPRESSION: 1. Cholelithiasis and gallbladder wall thickening with trace pericholecystic fluid are strongly suggestive of Acute Cholecystitis. Individual gallstone size estimated at 20 mm. 2. CBD at the upper limits of normal. 3. Several small benign hepatic lesions are incidentally noted. Electronically Signed   By: Genevie Ann M.D.   On:  11/23/2017 22:24    Lab Data:  CBC: Recent Labs  Lab 11/23/17 1032 11/24/17 0230 11/25/17 0500  WBC 18.9* 16.3* 11.9*  NEUTROABS  --  13.4*  --   HGB 13.3 11.3* 10.1*  HCT 40.5 35.0* 31.6*  MCV 94.0 94.1 95.2  PLT 313 249 151   Basic Metabolic Panel: Recent Labs  Lab 11/23/17 1032 11/24/17 0230 11/25/17 0500  NA 134* 135 136  K 4.3 4.0 3.7  CL 100* 102 103  CO2 20* 24 25  GLUCOSE 167* 142* 99  BUN 14 19 24*  CREATININE 1.65* 1.92* 2.06*  CALCIUM 9.6 8.4* 8.3*   GFR: Estimated Creatinine Clearance: 37.2 mL/min (A) (by C-G formula based  on SCr of 2.06 mg/dL (H)). Liver Function Tests: Recent Labs  Lab 11/23/17 1032 11/24/17 0230 11/25/17 0500  AST _0 ALT 19 15* 15*  ALKPHOS 67 53 52  BILITOT 1.5* 1.5* 2.3*  PROT 8.8* 7.3 7.0  ALBUMIN 3.9 3.1* 2.9*   Recent Labs  Lab 11/23/17 1032 11/24/17 0230  LIPASE 23 22   No results for input(s): AMMONIA in the last 168 hours. Coagulation Profile: Recent Labs  Lab 11/23/17 2229  INR 1.27   Cardiac Enzymes: No results for input(s): CKTOTAL, CKMB, CKMBINDEX, TROPONINI in the last 168 hours. BNP (last 3 results) No results for input(s): PROBNP in the last 8760 hours. HbA1C: No results for input(s): HGBA1C in the last 72 hours. CBG: Recent Labs  Lab 11/24/17 0745 11/25/17 0815  GLUCAP 121* 101*   Lipid Profile: No results for input(s): CHOL, HDL, LDLCALC, TRIG, CHOLHDL, LDLDIRECT in the last 72 hours. Thyroid Function Tests: No results for input(s): TSH, T4TOTAL, FREET4, T3FREE, THYROIDAB in the last 72 hours. Anemia Panel: No results for input(s): VITAMINB12, FOLATE, FERRITIN, TIBC, IRON, RETICCTPCT in the last 72 hours. Urine analysis:    Component Value Date/Time   COLORURINE YELLOW 11/23/2017 1050   APPEARANCEUR CLEAR 11/23/2017 1050   LABSPEC 1.024 11/23/2017 1050   PHURINE 5.0 11/23/2017 1050   GLUCOSEU NEGATIVE 11/23/2017 1050   HGBUR SMALL (A) 11/23/2017 1050   BILIRUBINUR NEGATIVE  11/23/2017 1050   KETONESUR NEGATIVE 11/23/2017 1050   PROTEINUR >=300 (A) 11/23/2017 1050   NITRITE NEGATIVE 11/23/2017 1050   LEUKOCYTESUR NEGATIVE 11/23/2017 1050     Ripudeep Rai M.D. [facility] 11/25/2017, 2:40 PM  Pager: [phone] Between 7am to 7pm - call Pager - [phone]  After 7pm go to [email or web address] - password [email or web address]  Call night coverage person covering after 7pm

## 2017-11-25 NOTE — Procedures (Signed)
Acute calculus cholecystitis  S/p perc chole drain  No comp Stable Infected bile aspirated cx sent Keep to gravity bag Full report in pacs

## 2017-11-25 NOTE — Sedation Documentation (Signed)
Patient is resting comfortably. 

## 2017-11-25 NOTE — Care Management Note (Addendum)
Case Management Note  Patient Details  Name: [name] MRN: [id number] Date of Birth: [birth year]  Subjective/Objective:  History of CHF EF 25% at best, CKD stage III, Hypertension  alcohol Abuse; Admitted for Acute cholecystis.Right lower pole lesion noted incidentally, possibly reflecting pyelonephritis, infarct, or mass  Action/Plan: Percutaneous gallbladder drain placed today 11/25/17. ECHO pending. Prior to admission patient lived at home with spouse.  NCM will continue to monitor for discharge transition needs.  In to speak with patient, discussed no PCP listed; patient okay with NCM making appointment, prefers morning if possible.  NCM schedule PCP appointment and put on discharge instructions.   Expected Discharge Date:   To be determined               Expected Discharge Plan:    To be determined Discharge planning Services  CM Consult  Status of Service:  In process, will continue to follow  If discussed at Long Length of Stay Meetings, dates discussed:    Kristen Cardinal, RN  Nurse case [place] 11/25/2017, 2:20 PM

## 2017-11-25 NOTE — Progress Notes (Signed)
  Echocardiogram 2D Echocardiogram has been performed.  [name] 11/25/2017, 11:43 AM

## 2017-11-25 NOTE — Sedation Documentation (Signed)
Patient denies pain and is resting comfortably.  

## 2017-11-26 LAB — COMPREHENSIVE METABOLIC PANEL
ALT: 22 U/L (ref 17–63)
AST: 30 U/L (ref 15–41)
Albumin: 2.6 g/dL — ABNORMAL LOW (ref 3.5–5.0)
Alkaline Phosphatase: 52 U/L (ref 38–126)
Anion gap: 8 (ref 5–15)
BUN: 22 mg/dL — ABNORMAL HIGH (ref 6–20)
CO2: 24 mmol/L (ref 22–32)
Calcium: 8.1 mg/dL — ABNORMAL LOW (ref 8.9–10.3)
Chloride: 105 mmol/L (ref 101–111)
Creatinine, Ser: 1.96 mg/dL — ABNORMAL HIGH (ref 0.61–1.24)
GFR calc Af Amer: 37 mL/min — ABNORMAL LOW (ref >60)
GFR calc non Af Amer: 32 mL/min — ABNORMAL LOW (ref >60)
Glucose, Bld: 126 mg/dL — ABNORMAL HIGH (ref 65–99)
Potassium: 3.5 mmol/L (ref 3.5–5.1)
Sodium: 137 mmol/L (ref 135–145)
Total Bilirubin: 2 mg/dL — ABNORMAL HIGH (ref 0.3–1.2)
Total Protein: 6.9 g/dL (ref 6.5–8.1)

## 2017-11-26 LAB — CBC
HCT: 28.7 % — ABNORMAL LOW (ref 39.0–52.0)
Hemoglobin: 8.8 g/dL — ABNORMAL LOW (ref 13.0–17.0)
MCH: 29.1 pg (ref 26.0–34.0)
MCHC: 30.7 g/dL (ref 30.0–36.0)
MCV: 95 fL (ref 78.0–100.0)
Platelets: 261 10*3/uL (ref 150–400)
RBC: 3.02 MIL/uL — ABNORMAL LOW (ref 4.22–5.81)
RDW: 15.2 % (ref 11.5–15.5)
WBC: 9.2 10*3/uL (ref 4.0–10.5)

## 2017-11-26 LAB — GLUCOSE, CAPILLARY: Glucose-Capillary: 126 mg/dL — ABNORMAL HIGH (ref 65–99)

## 2017-11-26 MED ORDER — TRAMADOL HCL 50 MG PO TABS: 50.0000 mg | ORAL_TABLET | Freq: Four times a day (QID) | ORAL | 0 refills | Status: DC | PRN

## 2017-11-26 MED ORDER — CARVEDILOL 6.25 MG PO TABS: 6.2500 mg | ORAL_TABLET | Freq: Two times a day (BID) | ORAL | 3 refills | Status: DC

## 2017-11-26 MED ORDER — ONDANSETRON 4 MG PO TBDP: 4.0000 mg | ORAL_TABLET | Freq: Three times a day (TID) | ORAL | 0 refills | Status: DC | PRN

## 2017-11-26 MED ORDER — CARVEDILOL 6.25 MG PO TABS: 6.2500 mg | ORAL_TABLET | Freq: Two times a day (BID) | ORAL | Status: DC

## 2017-11-26 MED ORDER — CEFUROXIME AXETIL 500 MG PO TABS: 500.0000 mg | ORAL_TABLET | Freq: Two times a day (BID) | ORAL | 0 refills | Status: AC

## 2017-11-26 NOTE — Progress Notes (Signed)
Subjective/Chief Complaint: PT doing well after IR proc Tol CLD   Objective: Vital signs in last 24 hours: Temp:  [98.5 F (36.9 C)-98.8 F (37.1 C)] 98.6 F (37 C) (03/30 0550) Pulse Rate:  [82-89] 82 (03/30 0550) Resp:  [19-20] 20 (03/30 0550) BP: (111-136)/(64-79) 116/79 (03/30 0550) SpO2:  [100 %] 100 % (03/30 0550) Weight:  [96.5 kg (212 lb 11.9 oz)] 96.5 kg (212 lb 11.9 oz) (03/29 2101) Last BM Date: 11/25/17  Intake/Output from previous day: 03/29 0701 - 03/30 0700 In: 2083.8 [P.O.:120; I.V.:1798.8; IV Piggyback:150] Out: 690 [Urine:600; Drains:90] Intake/Output this shift: No intake/output data recorded.  Constitutional: No acute distress, conversant, appears states age. Eyes: Anicteric sclerae, moist conjunctiva, no lid lag Lungs: Clear to auscultation bilaterally, normal respiratory effort CV: regular rate and rhythm, no murmurs, no peripheral edema, pedal pulses 2+ GI: Soft, no masses or hepatosplenomegaly, non-tender to palpation, JP Bilious Skin: No rashes, palpation reveals normal turgor Psychiatric: appropriate judgment and insight, oriented to person, place, and time   Lab Results:  Recent Labs    11/25/17 0500 11/26/17 0345  WBC 11.9* 9.2  HGB 10.1* 8.8*  HCT 31.6* 28.7*  PLT 243 261   BMET Recent Labs    11/25/17 0500 11/26/17 0345  NA 136 137  K 3.7 3.5  CL 103 105  CO2 25 24  GLUCOSE 99 126*  BUN 24* 22*  CREATININE 2.06* 1.96*  CALCIUM 8.3* 8.1*   PT/INR Recent Labs    11/23/17 2229  LABPROT 15.8*  INR 1.27   ABG No results for input(s): PHART, HCO3 in the last 72 hours.  Invalid input(s): PCO2, PO2  Studies/Results: Ir Perc Cholecystostomy  Result Date: 11/25/2017 INDICATION: Acute calculus cholecystitis EXAM: ULTRASOUND AND FLUOROSCOPIC TRANSHEPATIC CHOLECYSTOSTOMY MEDICATIONS: 3.375 G ZOSYN; The antibiotic was administered within an appropriate time frame prior to the initiation of the procedure.  ANESTHESIA/SEDATION: Moderate (conscious) sedation was employed during this procedure. A total of Versed 1 mg and Fentanyl 50 mcg was administered intravenously. Moderate Sedation Time: 15 minutes. The patient's level of consciousness and vital signs were monitored continuously by radiology nursing throughout the procedure under my direct supervision. FLUOROSCOPY TIME:  Fluoroscopy Time: 24 seconds (1.9 mGy). COMPLICATIONS: None immediate. PROCEDURE: Informed written consent was obtained from the patient after a thorough discussion of the procedural risks, benefits and alternatives. All questions were addressed. Maximal Sterile Barrier Technique was utilized including caps, mask, sterile gowns, sterile gloves, sterile drape, hand hygiene and skin antiseptic. A timeout was performed prior to the initiation of the procedure. previous imaging reviewed. preliminary ultrasound performed. the dilated gallbladder with wall thickening and gallstones was localized in the right upper quadrant just beneath the right subcostal margin. overlying skin marked. Under sterile conditions and local anesthesia, ultrasound micro needle access performed of the dilated gallbladder through a transhepatic window. Needle position confirmed with ultrasound. Images obtained for documentation. Guidewire advanced followed by the Accustick dilator set. This allowed insertion of an Amplatz guidewire followed by tract dilatation to insert a 10 [place] drain. Drain catheter position confirmed with ultrasound and fluoroscopy. Images obtained for documentation. Syringe aspiration yielded exudative bile. Sample sent for culture. Catheter secured with a Prolene suture and connected to external gravity drainage bag. Sterile dressing applied. No immediate complication. Patient tolerated the procedure well. IMPRESSION: Successful ultrasound fluoroscopic 10 French transhepatic cholecystostomy. Electronically Signed   By: [name].  Shick M.D.   On: 11/25/2017 09:54     Anti-infectives: Anti-infectives (From admission, onward)  Start     Dose/Rate Route Frequency Ordered Stop   11/24/17 0200  piperacillin-tazobactam (ZOSYN) IVPB 3.375 g     3.375 g 12.5 mL/hr over 240 Minutes Intravenous Every 8 hours 11/23/17 2119     11/23/17 1845  piperacillin-tazobactam (ZOSYN) IVPB 3.375 g     3.375 g 100 mL/hr over 30 Minutes Intravenous  Once 11/23/17 1836 11/23/17 2105      Assessment/Plan: Acute cholecystitis with cholelithiasis.s/p per chole tube. Doing better.  On CLD tol well  Right renal lesion.Question pyelonephritis versus infarct vs. Incidental. Not really symptomatic check urinalysis.... with proteinuria but does not look infected.  Chronic systolic CHF...somewhat noncompliant and not followed by cardiology in [place]. Needs formal cardiology evaluation this morning. EF 25% at best. CKD stage III Hypertension  alcohol Abuse--no agitation or signs of withdrawal at this hour   Plan: -adv diet as tol -continue antibiotics-Zosyn  f/u with Dr. Dalbert Batman s/p DC    LOS: 3 days    Rosario Jacks., [facility] 11/26/2017

## 2017-11-26 NOTE — Progress Notes (Signed)
Referring Physician(s): Dalbert Batman  Supervising Physician: Daryll Brod  Patient Status:  [facility] ([facility]) - In-pt  Chief Complaint:  Acute cholecystitis S/P perc chole 3/29 by Annamaria Boots  Subjective:  Patient sitting on side of the bed. Feels much better. Tolerating clears.  Allergies: Patient has no known allergies.  Medications: Prior to Admission medications   Medication Sig Start Date End Date Taking? Authorizing Provider  acetaminophen (TYLENOL) 500 MG tablet Take 1,000 mg by mouth every 6 (six) hours as needed for moderate pain or headache.   Yes [provider]  aspirin EC 81 MG tablet Take 81 mg by mouth daily.   Yes [provider]  Multiple Vitamin (MULTIVITAMIN) tablet Take 1 tablet by mouth daily.   Yes [provider]  carvedilol (COREG) 6.25 MG tablet Take 1 tablet (6.25 mg total) by mouth 2 (two) times daily with a meal. 11/26/17   Rai, Ripudeep K, MD  cefUROXime (CEFTIN) 500 MG tablet Take 1 tablet (500 mg total) by mouth 2 (two) times daily with a meal for 4 days. 11/26/17 11/30/17  Rai, Ripudeep Raliegh Ip, MD  ondansetron (ZOFRAN ODT) 4 MG disintegrating tablet Take 1 tablet (4 mg total) by mouth every 8 (eight) hours as needed for nausea or vomiting. 11/26/17   Rai, Ripudeep K, MD  traMADol (ULTRAM) 50 MG tablet Take 1 tablet (50 mg total) by mouth every 6 (six) hours as needed for moderate pain or severe pain. 11/26/17 11/26/18  Mendel Corning, MD     Vital Signs: BP 116/79 (BP Location: Left Arm)   Pulse 82   Temp 98.6 F (37 C) (Oral)   Resp 20   Ht 5\' 11"  (1.803 m)   Wt 212 lb 11.9 oz (96.5 kg)   SpO2 100%   BMI 29.67 kg/m   Physical Exam Awake and alert NAD Drain in place RUQ Bilious drainage in bag, no purulence ~90 mL op overnight.  Imaging: Ct Abdomen Pelvis W Contrast  Result Date: 11/23/2017 CLINICAL DATA:  Lower abdominal pain for 3 days. History of cholelithiasis. EXAM: CT ABDOMEN AND PELVIS WITH CONTRAST TECHNIQUE: Multidetector CT  imaging of the abdomen and pelvis was performed using the standard protocol following bolus administration of intravenous contrast. CONTRAST:  85 cc ISOVUE-300 IOPAMIDOL (ISOVUE-300) INJECTION 61% COMPARISON:  Renal ultrasound February 26, 2015 FINDINGS: LOWER CHEST: Lung bases are clear. The heart is mildly enlarged. No pericardial effusion. HEPATOBILIARY: Multiple calcified gallstones in layering tiny gallstones versus sludge. Gallbladder wall thickening pericholecystic fat stranding and small volume fluid. PANCREAS: Normal. SPLEEN: Normal. ADRENALS/URINARY TRACT: Kidneys are orthotopic, demonstrating symmetric enhancement. No nephrolithiasis, hydronephrosis. Ill-defined 2.4 x 1.8 cm hypodensity lower pole RIGHT kidney on early phase, slightly larger appearing on delayed phase. Too small to characterize hypodensities bilateral kidneys. The unopacified ureters are normal in course and caliber. Delayed imaging through the kidneys demonstrates symmetric prompt contrast excretion within the proximal urinary collecting system. Urinary bladder is partially distended and unremarkable. Normal adrenal glands. STOMACH/BOWEL: Upper small hiatal hernia. The stomach, small and large bowel are normal in course and caliber without inflammatory changes. Severe sigmoid colonic diverticulosis. Normal appendix. VASCULAR/LYMPHATIC: Aortoiliac vessels are normal in course and caliber. Mild calcific atherosclerosis. No lymphadenopathy by CT size criteria. REPRODUCTIVE: Prostatomegaly invading base of bladder. OTHER: No focal fluid collection. Small volume free fluid in the pelvis. No intraperitoneal free air. Moderate fat containing umbilical hernia. MUSCULOSKELETAL: Nonacute. Mild degenerative change of the lumbar spine. IMPRESSION: 1. Cholelithiasis and acute cholecystitis. 2. RIGHT lower pole  ill-defined hypodensity concerning for pyelonephritis, less likely infarct. Recommend follow-up after resolution of acute symptoms to exclude  underlying mass. 3. Severe colonic diverticulosis. Aortic Atherosclerosis (ICD10-I70.0). Electronically Signed   By: Elon Alas M.D.   On: 11/23/2017 18:31   Ir Perc Cholecystostomy  Result Date: 11/25/2017 INDICATION: Acute calculus cholecystitis EXAM: ULTRASOUND AND FLUOROSCOPIC TRANSHEPATIC CHOLECYSTOSTOMY MEDICATIONS: 3.375 G ZOSYN; The antibiotic was administered within an appropriate time frame prior to the initiation of the procedure. ANESTHESIA/SEDATION: Moderate (conscious) sedation was employed during this procedure. A total of Versed 1 mg and Fentanyl 50 mcg was administered intravenously. Moderate Sedation Time: 15 minutes. The patient's level of consciousness and vital signs were monitored continuously by radiology nursing throughout the procedure under my direct supervision. FLUOROSCOPY TIME:  Fluoroscopy Time: 24 seconds (1.9 mGy). COMPLICATIONS: None immediate. PROCEDURE: Informed written consent was obtained from the patient after a thorough discussion of the procedural risks, benefits and alternatives. All questions were addressed. Maximal Sterile Barrier Technique was utilized including caps, mask, sterile gowns, sterile gloves, sterile drape, hand hygiene and skin antiseptic. A timeout was performed prior to the initiation of the procedure. previous imaging reviewed. preliminary ultrasound performed. the dilated gallbladder with wall thickening and gallstones was localized in the right upper quadrant just beneath the right subcostal margin. overlying skin marked. Under sterile conditions and local anesthesia, ultrasound micro needle access performed of the dilated gallbladder through a transhepatic window. Needle position confirmed with ultrasound. Images obtained for documentation. Guidewire advanced followed by the Accustick dilator set. This allowed insertion of an Amplatz guidewire followed by tract dilatation to insert a 10 [place] drain. Drain catheter position confirmed with  ultrasound and fluoroscopy. Images obtained for documentation. Syringe aspiration yielded exudative bile. Sample sent for culture. Catheter secured with a Prolene suture and connected to external gravity drainage bag. Sterile dressing applied. No immediate complication. Patient tolerated the procedure well. IMPRESSION: Successful ultrasound fluoroscopic 10 French transhepatic cholecystostomy. Electronically Signed   By: [name].  Shick M.D.   On: 11/25/2017 09:54   US Abdomen Limited Ruq  Result Date: 11/23/2017 CLINICAL DATA:  77 year old male with cholelithiasis and CT evidence of acute cholecystitis today. Abdominal pain. EXAM: ULTRASOUND ABDOMEN LIMITED RIGHT UPPER QUADRANT COMPARISON:  CT Abdomen and Pelvis 1806 hours. FINDINGS: Gallbladder: Multiple gallstones individually estimated up to 20 millimeters diameter. Positive gallbladder wall thickening of 5-7 millimeters (image 13). Evidence of trace pericholecystic fluid. Equivocal sonographic Murphy sign. Common bile duct: Diameter: 6 millimeters, upper limits of normal. Liver: Small round 18 millimeter echogenic focus in the central right hepatic lobe on image 52 is most compatible with a small benign hemangioma. There is a small simple appearing hepatic cyst in the left lobe measuring 8 millimeters (image 76). Background liver echogenicity is at the upper limits of normal. Portal vein is patent on color Doppler imaging with normal direction of blood flow towards the liver. Other findings: Negative visible right kidney. IMPRESSION: 1. Cholelithiasis and gallbladder wall thickening with trace pericholecystic fluid are strongly suggestive of Acute Cholecystitis. Individual gallstone size estimated at 20 mm. 2. CBD at the upper limits of normal. 3. Several small benign hepatic lesions are incidentally noted. Electronically Signed   By: Genevie Ann M.D.   On: 11/23/2017 22:24    Labs:  CBC: Recent Labs    11/23/17 1032 11/24/17 0230 11/25/17 0500  11/26/17 0345  WBC 18.9* 16.3* 11.9* 9.2  HGB 13.3 11.3* 10.1* 8.8*  HCT 40.5 35.0* 31.6* 28.7*  PLT 313 249 243 261  COAGS: Recent Labs    11/23/17 2229  INR 1.27  APTT 35    BMP: Recent Labs    11/23/17 1032 11/24/17 0230 11/25/17 0500 11/26/17 0345  NA 134* 135 136 137  K 4.3 4.0 3.7 3.5  CL 100* 102 103 105  CO2 20* 24 25 24   GLUCOSE 167* 142* 99 126*  BUN 14 19 24* 22*  CALCIUM 9.6 8.4* 8.3* 8.1*  CREATININE 1.65* 1.92* 2.06* 1.96*  GFRNONAA 39* 33* 30* 32*  GFRAA 46* 38* 35* 37*    LIVER FUNCTION TESTS: Recent Labs    11/23/17 1032 11/24/17 0230 11/25/17 0500 11/26/17 0345  BILITOT 1.5* 1.5* 2.3* 2.0*  AST 26 18 18 30   ALT 19 15* 15* 22  ALKPHOS 67 53 52 52  PROT 8.8* 7.3 7.0 6.9  ALBUMIN 3.9 3.1* 2.9* 2.6*    Assessment and Plan:  Acute cholecystitis S/P Perc Chole by Dr. Annamaria Boots on 11/25/2017 Routine drain care Keep to gravity bag. Plan per Gen Surg Probably will need F/U with IR in 6 weeks for drain injection to eval patency of the cystic duct.  Electronically Signed: Murrell Redden, PA-C 11/26/2017, 10:29 AM   I spent a total of 15 Minutes at the the patient's bedside AND on the patient's hospital floor or unit, greater than 50% of which was counseling/coordinating care for f/u after perc chole.

## 2017-11-26 NOTE — Progress Notes (Signed)
Gave patient dressing change supplies and Normal saline flush good for two weeks.[name],Patient's niece came and teached her Biliary catheter care like flushing 5 cc of saline twice a day,how to drained out the biliary bag daily and record the output and showed to her the normal appearance of insertion site of the catheter,how to look for signs of infections at the catheter site and how to change the dressing.She understood the instructions and no questions asked at the time of discharged.

## 2017-11-26 NOTE — Progress Notes (Signed)
Patient tolerated his soft diet at lunchtime.

## 2017-11-26 NOTE — Discharge Summary (Signed)
Physician Discharge Summary   Patient ID: [name] MRN: [id number] DOB/AGE: 1940/11/06 77 y.o.  Admit date: 11/23/2017 Discharge date: 11/26/2017  Primary Care Physician:  Patient, No Pcp Per   Recommendations for Outpatient Follow-up:  1. Follow up with PCP in 1-2 weeks 2. Please obtain BMP/CBC in one week 3. Please follow-up on the right renal lesion incidentally seen on CT abdomen pelvis.   Home Health: none Equipment/Devices:   Discharge Condition: stable  CODE STATUS: FULL  Diet recommendation:    Discharge Diagnoses:   . Acute cholecystitis with sepsis . Acute kidney injury on CKD (chronic kidney disease), stage III ([place]) . Right renal lesion-incidentally seen as ill-defined density on CT abdomen . Chronic systolic CHF (congestive heart failure) ([place]) . Essential hypertension . Alcohol use   Consults:   General surgery Interventional radial    Allergies:  No Known Allergies   DISCHARGE MEDICATIONS: Allergies as of 11/26/2017   No Known Allergies     Medication List    TAKE these medications   acetaminophen 500 MG tablet Commonly known as:  TYLENOL Take 1,000 mg by mouth every 6 (six) hours as needed for moderate pain or headache.   aspirin EC 81 MG tablet Take 81 mg by mouth daily.   carvedilol 6.25 MG tablet Commonly known as:  COREG Take 1 tablet (6.25 mg total) by mouth 2 (two) times daily with a meal.   cefUROXime 500 MG tablet Commonly known as:  CEFTIN Take 1 tablet (500 mg total) by mouth 2 (two) times daily with a meal for 4 days.   multivitamin tablet Take 1 tablet by mouth daily.   ondansetron 4 MG disintegrating tablet Commonly known as:  ZOFRAN ODT Take 1 tablet (4 mg total) by mouth every 8 (eight) hours as needed for nausea or vomiting.   traMADol 50 MG tablet Commonly known as:  ULTRAM Take 1 tablet (50 mg total) by mouth every 6 (six) hours as needed for moderate pain or severe pain.        Brief H and P: For  complete details please refer to admission H and P, but in brief Patient is a 77 year old male with chronic systolic CHF, EF 41-96%, CKD stage III, hypertension, alcohol use presented to ED with 2 days of severe abdominal pain.  Reported sharp, intermittent in mid and upper abdomen for last 2 days, not been eating or drinking since onset.  Denied any subjective fevers, any vomiting.  In ED, septic with leukocytosis 18.9, lactic acidosis 3.0, febrile 102.8 F, tachycardiac 110-128. CT abdomen pelvis showed cholelithiasis with acute cholecystitis, right lower pole ill-defined hypodensity concerning for pyelonephritis, less likely infarct, recommend follow-up after the resolution of acute symptoms to exclude any underlying mass.  Hospital Course:   Acute cholecystitis with sepsis - met sepsis criteria in ED on admission with leukocytosis 18.9, lactic acidosis 3.0, febrile 102.8 F, tachycardiac 110-128.  Source likely due to acute cholecystitis -Right upper quadrant ultrasound showed cholelithiasis and gallbladder wall thickening with trace pericholecystic fluid suggestive of acute cholecystitis.  Patient was placed on IV Zosyn. -General surgery and interventional radiology were consulted.  Patient underwent percutaneous cholecystostomy placement on 3/29 -Discussed with Dr. Rosendo Gros, clear to be discharged home if tolerating solid diet.  Patient will have drain teaching by RN prior to discharge. -Placed on Ceftin for 4 more days to complete full course for 7 days.     Alcohol use -Currently stable, no withdrawals, patient was placed on Ativan with CIWA  Acute on CKD (chronic kidney disease), stage III (HCC) -Baseline creatinine 1.4-1.7 -Creatinine improving 1.9, no Lasix or ACE inhibitor.  Patient received IV fluid hydration.    Chronic systolic CHF (congestive heart failure) (HCC) -Currently euvolemic, 2D echo in 2016 showed EF of 25 to 30%.  Reports that he does not take prescribed Lasix or ACE  inhibitor. -Patient received gentle hydration.  Patient was seen by cardiology and cleared for surgery.   -2D echo showed EF of 20-25% with diffuse hypokinesis, compared to prior study in 2016, EF is slightly lower.   - Recommend ACE inhibitor outpatient once creatinine function has stabilized, currently euvolemic.  Patient has not been following cardiology, requested follow-up outpatient with [facility] cardiology.     Essential hypertension -BP stable, continue Coreg 6.25 mg twice a day, no ACE inhibitor at this time due to renal insufficiency.  Right renal lesion -CT abdomen and pelvis incidentally showed right lower lobe ill-defined density possibly pyelonephritis, less likely infarct, rule out mass once acute symptoms are resolved.  Patient has no urinary symptoms.  UA negative, no UTI or hematuria. -Discussed in detail with the patient, recommended outpatient renal ultrasound or repeat CT abdomen and pelvis.  He will follow-up with general surgery in 2 weeks.    Day of Discharge S: Tolerating solid diet, hoping to go home  BP (!) 142/62 (BP Location: Left Arm)   Pulse 87   Temp 98.3 F (36.8 C) (Oral)   Resp 18   Ht 5' 11"  (1.803 m)   Wt 96.5 kg (212 lb 11.9 oz)   SpO2 100%   BMI 29.67 kg/m   Physical Exam: General: Alert and awake oriented x3 not in any acute distress. HEENT: anicteric sclera, pupils reactive to light and accommodation CVS: S1-S2 clear no murmur rubs or gallops Chest: clear to auscultation bilaterally, no wheezing rales or rhonchi Abdomen: soft nontender, nondistended, normal bowel sounds, PERC drain+  Extremities: no cyanosis, clubbing or edema noted bilaterally Neuro: Cranial nerves II-XII intact, no focal neurological deficits   The results of significant diagnostics from this hospitalization (including imaging, microbiology, ancillary and laboratory) are listed below for reference.      Procedures/Studies:  Echo 3/29 Study Conclusions  - Left  ventricle: The cavity size was normal. Wall thickness was   increased in a pattern of moderate LVH. Systolic function was   severely reduced. The estimated ejection fraction was in the   range of 20% to 25%. Diffuse hypokinesis. The study is not   technically sufficient to allow evaluation of LV diastolic   function. - Mitral valve: Mildly thickened leaflets . There was trivial   regurgitation. - Left atrium: The atrium was normal in size. - Right ventricle: The cavity size was mildly dilated. - Tricuspid valve: There was trivial regurgitation. - Pulmonary arteries: PA peak pressure: 26 mm Hg (S). - Inferior vena cava: The vessel was dilated. The respirophasic   diameter changes were blunted (< 50%), consistent with elevated   central venous pressure.  Impressions:  - Compared to a prior study in 2016, the LVEF is slightly lower at   20-25%.    Ct Abdomen Pelvis W Contrast  Result Date: 11/23/2017 CLINICAL DATA:  Lower abdominal pain for 3 days. History of cholelithiasis. EXAM: CT ABDOMEN AND PELVIS WITH CONTRAST TECHNIQUE: Multidetector CT imaging of the abdomen and pelvis was performed using the standard protocol following bolus administration of intravenous contrast. CONTRAST:  85 cc ISOVUE-300 IOPAMIDOL (ISOVUE-300) INJECTION 61% COMPARISON:  Renal ultrasound  February 26, 2015 FINDINGS: LOWER CHEST: Lung bases are clear. The heart is mildly enlarged. No pericardial effusion. HEPATOBILIARY: Multiple calcified gallstones in layering tiny gallstones versus sludge. Gallbladder wall thickening pericholecystic fat stranding and small volume fluid. PANCREAS: Normal. SPLEEN: Normal. ADRENALS/URINARY TRACT: Kidneys are orthotopic, demonstrating symmetric enhancement. No nephrolithiasis, hydronephrosis. Ill-defined 2.4 x 1.8 cm hypodensity lower pole RIGHT kidney on early phase, slightly larger appearing on delayed phase. Too small to characterize hypodensities bilateral kidneys. The unopacified  ureters are normal in course and caliber. Delayed imaging through the kidneys demonstrates symmetric prompt contrast excretion within the proximal urinary collecting system. Urinary bladder is partially distended and unremarkable. Normal adrenal glands. STOMACH/BOWEL: Upper small hiatal hernia. The stomach, small and large bowel are normal in course and caliber without inflammatory changes. Severe sigmoid colonic diverticulosis. Normal appendix. VASCULAR/LYMPHATIC: Aortoiliac vessels are normal in course and caliber. Mild calcific atherosclerosis. No lymphadenopathy by CT size criteria. REPRODUCTIVE: Prostatomegaly invading base of bladder. OTHER: No focal fluid collection. Small volume free fluid in the pelvis. No intraperitoneal free air. Moderate fat containing umbilical hernia. MUSCULOSKELETAL: Nonacute. Mild degenerative change of the lumbar spine. IMPRESSION: 1. Cholelithiasis and acute cholecystitis. 2. RIGHT lower pole ill-defined hypodensity concerning for pyelonephritis, less likely infarct. Recommend follow-up after resolution of acute symptoms to exclude underlying mass. 3. Severe colonic diverticulosis. Aortic Atherosclerosis (ICD10-I70.0). Electronically Signed   By: Elon Alas M.D.   On: 11/23/2017 18:31   Ir Perc Cholecystostomy  Result Date: 11/25/2017 INDICATION: Acute calculus cholecystitis EXAM: ULTRASOUND AND FLUOROSCOPIC TRANSHEPATIC CHOLECYSTOSTOMY MEDICATIONS: 3.375 G ZOSYN; The antibiotic was administered within an appropriate time frame prior to the initiation of the procedure. ANESTHESIA/SEDATION: Moderate (conscious) sedation was employed during this procedure. A total of Versed 1 mg and Fentanyl 50 mcg was administered intravenously. Moderate Sedation Time: 15 minutes. The patient's level of consciousness and vital signs were monitored continuously by radiology nursing throughout the procedure under my direct supervision. FLUOROSCOPY TIME:  Fluoroscopy Time: 24 seconds (1.9  mGy). COMPLICATIONS: None immediate. PROCEDURE: Informed written consent was obtained from the patient after a thorough discussion of the procedural risks, benefits and alternatives. All questions were addressed. Maximal Sterile Barrier Technique was utilized including caps, mask, sterile gowns, sterile gloves, sterile drape, hand hygiene and skin antiseptic. A timeout was performed prior to the initiation of the procedure. previous imaging reviewed. preliminary ultrasound performed. the dilated gallbladder with wall thickening and gallstones was localized in the right upper quadrant just beneath the right subcostal margin. overlying skin marked. Under sterile conditions and local anesthesia, ultrasound micro needle access performed of the dilated gallbladder through a transhepatic window. Needle position confirmed with ultrasound. Images obtained for documentation. Guidewire advanced followed by the Accustick dilator set. This allowed insertion of an Amplatz guidewire followed by tract dilatation to insert a 10 [place] drain. Drain catheter position confirmed with ultrasound and fluoroscopy. Images obtained for documentation. Syringe aspiration yielded exudative bile. Sample sent for culture. Catheter secured with a Prolene suture and connected to external gravity drainage bag. Sterile dressing applied. No immediate complication. Patient tolerated the procedure well. IMPRESSION: Successful ultrasound fluoroscopic 10 French transhepatic cholecystostomy. Electronically Signed   By: [name].  Shick M.D.   On: 11/25/2017 09:54   US Abdomen Limited Ruq  Result Date: 11/23/2017 CLINICAL DATA:  77 year old male with cholelithiasis and CT evidence of acute cholecystitis today. Abdominal pain. EXAM: ULTRASOUND ABDOMEN LIMITED RIGHT UPPER QUADRANT COMPARISON:  CT Abdomen and Pelvis 1806 hours. FINDINGS: Gallbladder: Multiple gallstones individually estimated up to 20  millimeters diameter. Positive gallbladder wall thickening of  5-7 millimeters (image 13). Evidence of trace pericholecystic fluid. Equivocal sonographic Murphy sign. Common bile duct: Diameter: 6 millimeters, upper limits of normal. Liver: Small round 18 millimeter echogenic focus in the central right hepatic lobe on image 52 is most compatible with a small benign hemangioma. There is a small simple appearing hepatic cyst in the left lobe measuring 8 millimeters (image 76). Background liver echogenicity is at the upper limits of normal. Portal vein is patent on color Doppler imaging with normal direction of blood flow towards the liver. Other findings: Negative visible right kidney. IMPRESSION: 1. Cholelithiasis and gallbladder wall thickening with trace pericholecystic fluid are strongly suggestive of Acute Cholecystitis. Individual gallstone size estimated at 20 mm. 2. CBD at the upper limits of normal. 3. Several small benign hepatic lesions are incidentally noted. Electronically Signed   By: Genevie Ann M.D.   On: 11/23/2017 22:24      LAB RESULTS: Basic Metabolic Panel: Recent Labs  Lab 11/25/17 0500 11/26/17 0345  NA 136 137  K 3.7 3.5  CL 103 105  CO2 25 24  GLUCOSE 99 126*  BUN 24* 22*  CREATININE 2.06* 1.96*  CALCIUM 8.3* 8.1*   Liver Function Tests: Recent Labs  Lab 11/25/17 0500 11/26/17 0345  AST 18 30  ALT 15* 22  ALKPHOS 52 52  BILITOT 2.3* 2.0*  PROT 7.0 6.9  ALBUMIN 2.9* 2.6*   Recent Labs  Lab 11/23/17 1032 11/24/17 0230  LIPASE 23 22   No results for input(s): AMMONIA in the last 168 hours. CBC: Recent Labs  Lab 11/24/17 0230 11/25/17 0500 11/26/17 0345  WBC 16.3* 11.9* 9.2  NEUTROABS 13.4*  --   --   HGB 11.3* 10.1* 8.8*  HCT 35.0* 31.6* 28.7*  MCV 94.1 95.2 95.0  PLT 249 243 261   Cardiac Enzymes: No results for input(s): CKTOTAL, CKMB, CKMBINDEX, TROPONINI in the last 168 hours. BNP: Invalid input(s): POCBNP CBG: Recent Labs  Lab 11/25/17 0815 11/26/17 0842  GLUCAP 101* 126*      Disposition and  Follow-up: Discharge Instructions    Diet - low sodium heart healthy   Complete by:  As directed    Discharge instructions   Complete by:  As directed    SOFT DIET FOR A FEW DAYS  Please request your doctor at the follow-up appointment to check kidney ultrasound for the shadow incidentally seen on the CT abdomen, which could be inflammation or artifact or a mass.   Increase activity slowly   Complete by:  As directed        DISPOSITION: home    [place]    Remer Macho, MD Follow up.   Specialty:  Internal Medicine Contact information: [facility] [address] [place] [address] [place]. Go on 03/01/2018.   Why:  Arrive at 8:45 am, establish PCP ID/Insurance Card, necessary Co-Pay and medication bottles,  Contact information: [address] [place]       Fanny Skates, MD. Schedule an appointment as soon as possible for a visit in 2 week(s).   Specialty:  General Surgery Why:  please ask the doctor to order renal ultrasound   Contact information: [place] [address] [phone]        Jerline Pain, MD. Schedule an appointment as soon as possible for a visit  in 2 week(s).   Specialty:  Cardiology Contact information: [address] [place] [address] [phone]            Time coordinating discharge:  18mns   Signed:   REstill CottaM.D. [facility] 11/26/2017, 1:40 PM Pager: [phone]

## 2017-11-28 LAB — CULTURE, BLOOD (ROUTINE X 2)
Culture: NO GROWTH
Culture: NO GROWTH
Special Requests: ADEQUATE
Special Requests: ADEQUATE

## 2017-11-29 NOTE — Consult Note (Signed)
            [facility] CM Primary Care Navigator  11/29/2017  [name] 02/25/1941 [id number]  Attempt to seepatient at the bedsideto identify possible discharge needs buthe was alreadydischarged.  Patient was discharged homeover the weekend.  PerMD note, patient wasadmitted for acute cholecystitis (status post perc cholecystectomy on 3/29 by Dr.Shick)  Primary care provider's office called(Dr. Leeroy Cha), confirmed that patient is still under his service- stated was last seen 11/11/2016).  Called [name]) and notified of patient's discharge,need for post hospital follow-up and transition of care (TOC). Notified ofpatient'shealth issues needing follow-up.  Made aware to refer patient to [facility] CM if deemed necessary or appropriate for services.  For additional questions please contact:  Edwena Felty A. Ajel, BSN, RN-BC [facility] PRIMARY CARE Navigator Cell: [phone]

## 2017-11-30 LAB — AEROBIC/ANAEROBIC CULTURE W GRAM STAIN (SURGICAL/DEEP WOUND)

## 2017-11-30 LAB — AEROBIC/ANAEROBIC CULTURE (SURGICAL/DEEP WOUND)

## 2017-12-08 ENCOUNTER — Encounter (HOSPITAL_COMMUNITY): Payer: Self-pay | Admitting: *Deleted

## 2017-12-08 ENCOUNTER — Emergency Department (HOSPITAL_COMMUNITY)
Admission: EM | Admit: 2017-12-08 | Discharge: 2017-12-08 | Disposition: A | Discharge status: Home / Self Care | Payer: Medicare Other | Attending: Emergency Medicine | Admitting: Emergency Medicine

## 2017-12-08 ENCOUNTER — Other Ambulatory Visit: Payer: Self-pay

## 2017-12-08 ENCOUNTER — Telehealth: Payer: Self-pay

## 2017-12-08 DIAGNOSIS — T85518A Breakdown (mechanical) of other gastrointestinal prosthetic devices, implants and grafts, initial encounter: Secondary | ICD-10-CM | POA: Diagnosis not present

## 2017-12-08 DIAGNOSIS — I130 Hypertensive heart and chronic kidney disease with heart failure and stage 1 through stage 4 chronic kidney disease, or unspecified chronic kidney disease: Secondary | ICD-10-CM | POA: Insufficient documentation

## 2017-12-08 DIAGNOSIS — Z79899 Other long term (current) drug therapy: Secondary | ICD-10-CM | POA: Insufficient documentation

## 2017-12-08 DIAGNOSIS — Z7982 Long term (current) use of aspirin: Secondary | ICD-10-CM | POA: Diagnosis not present

## 2017-12-08 DIAGNOSIS — I5042 Chronic combined systolic (congestive) and diastolic (congestive) heart failure: Secondary | ICD-10-CM | POA: Diagnosis not present

## 2017-12-08 DIAGNOSIS — Y69 Unspecified misadventure during surgical and medical care: Secondary | ICD-10-CM | POA: Diagnosis not present

## 2017-12-08 DIAGNOSIS — Z87891 Personal history of nicotine dependence: Secondary | ICD-10-CM | POA: Diagnosis not present

## 2017-12-08 DIAGNOSIS — N183 Chronic kidney disease, stage 3 (moderate): Secondary | ICD-10-CM | POA: Insufficient documentation

## 2017-12-08 DIAGNOSIS — K9423 Gastrostomy malfunction: Secondary | ICD-10-CM | POA: Diagnosis not present

## 2017-12-08 NOTE — Care Management (Signed)
Called patient and advised new patient appointment scheduled tomorrow 12/09/2017 at [place] [facility] scheduled.  Patient verbalized understanding.   NCM called and canceled PCP appointment previous scheduled with [place] for July 2019.   Kristen Cardinal, BSN, RN Nurse Case [facility]

## 2017-12-08 NOTE — ED Notes (Signed)
Patient states his f/u appointments were canceled and he is needing a primary doctor in order to get a home health nurse to come out to his home and change/check his biliary tubing in the mornings. Patient just scheduled appointment with Dr. Royce Macadamia tomorrow at 9:45am to get established with a PCP.

## 2017-12-08 NOTE — ED Triage Notes (Signed)
Pt is here for eval and needing supplies for his percutaneous cholecystostomy that was placed on 3/39 here. Pt states that all of his follow up appointment were canceled and he is unsure where to go.

## 2017-12-08 NOTE — ED Notes (Signed)
Patient provided with flushes for biliary tube and MD showed patient how to flush tubing. Pt verbalized understanding of discharge instructions and denies any further needs or questions at this time. VS stable. Patient ambulatory with steady gait.

## 2017-12-08 NOTE — ED Provider Notes (Signed)
[facility] EMERGENCY DEPARTMENT Provider Note   CSN: [id number] Arrival date & time: 12/08/17  4481     History   Chief Complaint No chief complaint on file.   HPI Sye Aloia is a 77 y.o. male.  HPI 77 year old male presents the emergency department with complaints of issues flushing his cholecystostomy tube.  He was recently discharged in the hospital on November 26, 2017 with a diagnosis of acute cholecystitis with associated sepsis.  His cholecystitis has been treated with antibiotics and cholecystostomy tube.  He states that he has been flushing it however he has run out of the flushes and is requesting saline for flushing and instruction on how to flush it as his daughter has been doing it and states that she could no longer help.  He is scheduled to see general surgery.  Denies fevers and chills.  No nausea or vomiting.   Past Medical History:  Diagnosis Date  . Alcohol abuse 02/26/2015  . Chronic combined systolic and diastolic CHF (congestive heart failure) ([place])   . CKD (chronic kidney disease), stage III ([place])   . Demand ischemia ([place])    a. minimally elevated troponin peak 0.11 in 2016, felt demand ischemia. Cath offered but pt wished to have done back in [place].  . Diabetes mellitus without complication ([place])   . Essential hypertension   . Hyperlipidemia   . Hypertension   . Solitary kidney     Patient Active Problem List   Diagnosis Date Noted  . Acute cholecystitis 11/23/2017  . Renal lesion 11/23/2017  . Alcohol abuse 02/26/2015  . Hyperlipidemia   . Diabetes mellitus without complication ([place])   . CKD (chronic kidney disease), stage III ([place])   . Chronic systolic CHF (congestive heart failure) ([place])   . Essential hypertension     Past Surgical History:  Procedure Laterality Date  . IR PERC CHOLECYSTOSTOMY  11/25/2017        Home Medications    Prior to Admission medications   Medication Sig Start Date End Date Taking?  Authorizing Provider  acetaminophen (TYLENOL) 500 MG tablet Take 1,000 mg by mouth every 6 (six) hours as needed for moderate pain or headache.    [provider]  aspirin EC 81 MG tablet Take 81 mg by mouth daily.    [provider]  carvedilol (COREG) 6.25 MG tablet Take 1 tablet (6.25 mg total) by mouth 2 (two) times daily with a meal. 11/26/17   Rai, Ripudeep K, MD  Multiple Vitamin (MULTIVITAMIN) tablet Take 1 tablet by mouth daily.    [provider]  ondansetron (ZOFRAN ODT) 4 MG disintegrating tablet Take 1 tablet (4 mg total) by mouth every 8 (eight) hours as needed for nausea or vomiting. 11/26/17   Rai, Ripudeep K, MD  traMADol (ULTRAM) 50 MG tablet Take 1 tablet (50 mg total) by mouth every 6 (six) hours as needed for moderate pain or severe pain. 11/26/17 11/26/18  Mendel Corning, MD    Family History Family History  Problem Relation Age of Onset  . Other Mother        died in her sleep at 19  . CAD Neg Hx        neg hx premature CAD    Social History Social History   Tobacco Use  . Smoking status: Former Smoker    Types: Cigarettes    Last attempt to quit: 02/26/2008    Years since quitting: 9.7  . Smokeless tobacco: Never  Used  Substance Use Topics  . Alcohol use: Yes    Comment: occ  . Drug use: No     Allergies   Patient has no known allergies.   Review of Systems Review of Systems  All other systems reviewed and are negative.    Physical Exam Updated Vital Signs BP (!) 154/80 (BP Location: Right Arm)   Pulse 87   Temp 97.6 F (36.4 C) (Oral)   Resp 16   SpO2 99%   Physical Exam  Constitutional: He is oriented to person, place, and time. He appears well-developed and well-nourished.  HENT:  Head: Normocephalic.  Eyes: EOM are normal.  Neck: Normal range of motion.  Cardiovascular: Normal rate and regular rhythm.  Pulmonary/Chest: Effort normal and breath sounds normal.  Abdominal: He exhibits no distension. There is  no tenderness.  Cholecystostomy tube and right upper quadrant.  Musculoskeletal: Normal range of motion.  Neurological: He is alert and oriented to person, place, and time.  Psychiatric: He has a normal mood and affect.  Nursing note and vitals reviewed.    ED Treatments / Results  Labs (all labs ordered are listed, but only abnormal results are displayed) Labs Reviewed - No data to display  EKG None  Radiology No results found.  Procedures Procedures (including critical care time)  Medications Ordered in ED Medications - No data to display   Initial Impression / Assessment and Plan / ED Course  I have reviewed the triage vital signs and the nursing notes.  Pertinent labs & imaging results that were available during my care of the patient were reviewed by me and considered in my medical decision making (see chart for details).     No abdominal tenderness.  Patient's cholecystostomy tube was flushed without difficulty at the bedside.  Return of bilious material obtained.  Free-flowing cholecystostomy tube now.  Discharged home with a handful of saline flushes and additional instructions.  Outpatient primary care and general surgery follow-up.  He understands return to the ER for new or worsening symptoms.  Final Clinical Impressions(s) / ED Diagnoses   Final diagnoses:  Cholecystostomy tube dysfunction, initial encounter    ED Discharge Orders    None       Jola Schmidt, MD 12/08/17 1443

## 2017-12-09 DIAGNOSIS — I5022 Chronic systolic (congestive) heart failure: Secondary | ICD-10-CM | POA: Diagnosis not present

## 2017-12-09 DIAGNOSIS — E559 Vitamin D deficiency, unspecified: Secondary | ICD-10-CM | POA: Diagnosis not present

## 2017-12-09 DIAGNOSIS — Z0000 Encounter for general adult medical examination without abnormal findings: Secondary | ICD-10-CM | POA: Diagnosis not present

## 2017-12-09 DIAGNOSIS — R5383 Other fatigue: Secondary | ICD-10-CM | POA: Diagnosis not present

## 2017-12-09 DIAGNOSIS — R0602 Shortness of breath: Secondary | ICD-10-CM | POA: Diagnosis not present

## 2017-12-15 ENCOUNTER — Telehealth: Payer: Self-pay

## 2017-12-15 DIAGNOSIS — I10 Essential (primary) hypertension: Secondary | ICD-10-CM | POA: Diagnosis not present

## 2017-12-15 DIAGNOSIS — Z87898 Personal history of other specified conditions: Secondary | ICD-10-CM | POA: Diagnosis not present

## 2017-12-15 DIAGNOSIS — K8000 Calculus of gallbladder with acute cholecystitis without obstruction: Secondary | ICD-10-CM | POA: Diagnosis not present

## 2017-12-15 DIAGNOSIS — I5022 Chronic systolic (congestive) heart failure: Secondary | ICD-10-CM | POA: Diagnosis not present

## 2017-12-15 DIAGNOSIS — N2889 Other specified disorders of kidney and ureter: Secondary | ICD-10-CM | POA: Diagnosis not present

## 2017-12-15 DIAGNOSIS — N183 Chronic kidney disease, stage 3 (moderate): Secondary | ICD-10-CM | POA: Diagnosis not present

## 2017-12-15 NOTE — Telephone Encounter (Signed)
attached notes to May file.

## 2017-12-19 ENCOUNTER — Ambulatory Visit: Payer: Medicare Other | Admitting: Cardiology

## 2017-12-19 DIAGNOSIS — N183 Chronic kidney disease, stage 3 (moderate): Secondary | ICD-10-CM | POA: Diagnosis not present

## 2017-12-19 DIAGNOSIS — E119 Type 2 diabetes mellitus without complications: Secondary | ICD-10-CM | POA: Diagnosis not present

## 2017-12-19 DIAGNOSIS — D539 Nutritional anemia, unspecified: Secondary | ICD-10-CM | POA: Diagnosis not present

## 2017-12-19 DIAGNOSIS — K811 Chronic cholecystitis: Secondary | ICD-10-CM | POA: Diagnosis not present

## 2017-12-21 ENCOUNTER — Telehealth (HOSPITAL_COMMUNITY): Payer: Self-pay

## 2017-12-21 ENCOUNTER — Other Ambulatory Visit (HOSPITAL_COMMUNITY): Payer: Self-pay | Admitting: General Surgery

## 2017-12-21 ENCOUNTER — Other Ambulatory Visit (HOSPITAL_COMMUNITY): Payer: Self-pay | Admitting: Interventional Radiology

## 2017-12-21 DIAGNOSIS — K8000 Calculus of gallbladder with acute cholecystitis without obstruction: Secondary | ICD-10-CM

## 2017-12-21 NOTE — Telephone Encounter (Signed)
Called to schedule drain study, no answer, left vm. AW 

## 2017-12-27 ENCOUNTER — Ambulatory Visit (HOSPITAL_COMMUNITY)
Admission: RE | Admit: 2017-12-27 | Discharge: 2017-12-27 | Disposition: A | Discharge status: Home / Self Care | Payer: Medicare Other | Source: Ambulatory Visit | Attending: General Surgery | Admitting: General Surgery

## 2017-12-27 ENCOUNTER — Encounter (HOSPITAL_COMMUNITY): Payer: Self-pay | Admitting: Interventional Radiology

## 2017-12-27 DIAGNOSIS — K8000 Calculus of gallbladder with acute cholecystitis without obstruction: Secondary | ICD-10-CM | POA: Diagnosis not present

## 2017-12-27 HISTORY — PX: IR CHOLANGIOGRAM EXISTING TUBE: IMG6040

## 2017-12-27 MED ORDER — IOPAMIDOL (ISOVUE-300) INJECTION 61%
INTRAVENOUS | Status: AC
  Administered 2017-12-27: 10 mL
  Filled 2017-12-27: qty 50

## 2018-01-03 ENCOUNTER — Inpatient Hospital Stay (INDEPENDENT_AMBULATORY_CARE_PROVIDER_SITE_OTHER): Payer: Medicare Other | Admitting: Physician Assistant

## 2018-01-27 ENCOUNTER — Ambulatory Visit: Payer: Medicare Other | Admitting: Cardiology

## 2018-01-30 ENCOUNTER — Telehealth: Payer: Self-pay

## 2018-01-30 DIAGNOSIS — N2889 Other specified disorders of kidney and ureter: Secondary | ICD-10-CM | POA: Diagnosis not present

## 2018-01-30 DIAGNOSIS — K8000 Calculus of gallbladder with acute cholecystitis without obstruction: Secondary | ICD-10-CM | POA: Diagnosis not present

## 2018-01-30 DIAGNOSIS — N183 Chronic kidney disease, stage 3 (moderate): Secondary | ICD-10-CM | POA: Diagnosis not present

## 2018-01-30 DIAGNOSIS — Z87898 Personal history of other specified conditions: Secondary | ICD-10-CM | POA: Diagnosis not present

## 2018-01-30 DIAGNOSIS — I5022 Chronic systolic (congestive) heart failure: Secondary | ICD-10-CM | POA: Diagnosis not present

## 2018-01-30 DIAGNOSIS — I10 Essential (primary) hypertension: Secondary | ICD-10-CM | POA: Diagnosis not present

## 2018-01-30 NOTE — Telephone Encounter (Signed)
Attached notes to July file

## 2018-02-01 ENCOUNTER — Other Ambulatory Visit: Payer: Self-pay | Admitting: General Surgery

## 2018-02-01 DIAGNOSIS — K819 Cholecystitis, unspecified: Secondary | ICD-10-CM

## 2018-02-02 ENCOUNTER — Ambulatory Visit
Admission: RE | Admit: 2018-02-02 | Discharge: 2018-02-02 | Disposition: A | Discharge status: Home / Self Care | Payer: Medicare Other | Source: Ambulatory Visit | Attending: General Surgery | Admitting: General Surgery

## 2018-02-02 ENCOUNTER — Encounter: Payer: Self-pay | Admitting: Radiology

## 2018-02-02 DIAGNOSIS — K8000 Calculus of gallbladder with acute cholecystitis without obstruction: Secondary | ICD-10-CM | POA: Diagnosis not present

## 2018-02-02 DIAGNOSIS — K819 Cholecystitis, unspecified: Secondary | ICD-10-CM

## 2018-02-02 DIAGNOSIS — K8020 Calculus of gallbladder without cholecystitis without obstruction: Secondary | ICD-10-CM | POA: Diagnosis not present

## 2018-02-02 HISTORY — PX: IR RADIOLOGIST EVAL & MGMT: IMG5224

## 2018-02-02 NOTE — Progress Notes (Addendum)
Referring Physician(s): Ingram,Haywood  Chief Complaint: The patient is seen in follow up today s/p percutaneous cholecystostomy on 11/25/2017  History of present illness: [name] is a 77 year old male with recent history of acute cholecystitis in March of this year requiring percutaneous cholecystostomy on 11/25/2017.  Past medical history also significant for right renal mass, hypertension, chronic kidney disease, CHF, diabetes, hyperlipidemia and alcohol abuse.  He underwent follow-up cholangiogram on 12/27/2017 which revealed patent cystic and common bile ducts as well as cholelithiasis.  He presents today following evaluation by Dr. Dalbert Batman on 6/3 for cholecystostomy drain removal.  He denies fever, headache, chest pain, dyspnea, cough, abdominal/back pain, nausea, vomiting or bleeding.  He does have occasional constipation.  He is eating okay.  He is currently not irrigating the drain.  Past Medical History:  Diagnosis Date  . Acute cholecystitis 11/23/2017  . Acute respiratory failure with hypoxia ([place])   . AKI (acute kidney injury) ([place])   . Alcohol abuse 02/26/2015  . Chronic combined systolic and diastolic CHF (congestive heart failure) ([place])   . CKD (chronic kidney disease), stage III ([place])   . Demand ischemia ([place])    a. minimally elevated troponin peak 0.11 in 2016, felt demand ischemia. Cath offered but pt wished to have done back in [place].  . Diabetes mellitus without complication ([place])   . Elevated troponin 02/26/2015  . Essential hypertension   . Hyperlipidemia   . Hypertension   . Renal lesion 11/23/2017  . Solitary kidney     Past Surgical History:  Procedure Laterality Date  . IR CHOLANGIOGRAM EXISTING TUBE  12/27/2017  . IR PERC CHOLECYSTOSTOMY  11/25/2017    Allergies: Patient has no known allergies.  Medications: Prior to Admission medications   Medication Sig Start Date End Date Taking? Authorizing Provider  acetaminophen (TYLENOL) 500 MG tablet Take 1,000  mg by mouth every 6 (six) hours as needed for moderate pain or headache.    [provider]  aspirin EC 81 MG tablet Take 81 mg by mouth daily.    [provider]  carvedilol (COREG) 6.25 MG tablet Take 1 tablet (6.25 mg total) by mouth 2 (two) times daily with a meal. 11/26/17   Rai, Ripudeep K, MD  Multiple Vitamin (MULTIVITAMIN) tablet Take 1 tablet by mouth daily.    [provider]  ondansetron (ZOFRAN ODT) 4 MG disintegrating tablet Take 1 tablet (4 mg total) by mouth every 8 (eight) hours as needed for nausea or vomiting. 11/26/17   Rai, Ripudeep K, MD  traMADol (ULTRAM) 50 MG tablet Take 1 tablet (50 mg total) by mouth every 6 (six) hours as needed for moderate pain or severe pain. 11/26/17 11/26/18  Mendel Corning, MD     Family History  Problem Relation Age of Onset  . Other Mother        died in her sleep at 65  . CAD Neg Hx        neg hx premature CAD    Social History   Socioeconomic History  . Marital status: Married    Spouse name: Not on file  . Number of children: Not on file  . Years of education: Not on file  . Highest education level: Not on file  Occupational History  . Not on file  Social Needs  . Financial resource strain: Not on file  . Food insecurity:    Worry: Not on file    Inability: Not on file  . Transportation needs:  Medical: Not on file    Non-medical: Not on file  Tobacco Use  . Smoking status: Former Smoker    Types: Cigarettes    Last attempt to quit: 02/26/2008    Years since quitting: 9.9  . Smokeless tobacco: Never Used  Substance and Sexual Activity  . Alcohol use: Yes    Comment: occ  . Drug use: No  . Sexual activity: Not on file  Lifestyle  . Physical activity:    Days per week: Not on file    Minutes per session: Not on file  . Stress: Not on file  Relationships  . Social connections:    Talks on phone: Not on file    Gets together: Not on file    Attends religious service: Not on file     Active member of club or organization: Not on file    Attends meetings of clubs or organizations: Not on file    Relationship status: Not on file  Other Topics Concern  . Not on file  Social History Narrative  . Not on file     Vital Signs: Vital signs stable, afebrile.    Physical Exam awake, alert.  Chest clear to auscultation bilaterally.  Heart with regular rate and rhythm.  Abdomen soft, positive bowel sounds, nontender.  Gallbladder drain intact, insertion site okay, not significantly tender.  Small amount of bile in drain bag.  Imaging: No results found.  Labs:  CBC: Recent Labs    11/23/17 1032 11/24/17 0230 11/25/17 0500 11/26/17 0345  WBC 18.9* 16.3* 11.9* 9.2  HGB 13.3 11.3* 10.1* 8.8*  HCT 40.5 35.0* 31.6* 28.7*  PLT 313 249 243 261    COAGS: Recent Labs    11/23/17 2229  INR 1.27  APTT 35    BMP: Recent Labs    11/23/17 1032 11/24/17 0230 11/25/17 0500 11/26/17 0345  NA 134* 135 136 137  K 4.3 4.0 3.7 3.5  CL 100* 102 103 105  CO2 20* 24 25 24   GLUCOSE 167* 142* 99 126*  BUN 14 19 24* 22*  CALCIUM 9.6 8.4* 8.3* 8.1*  CREATININE 1.65* 1.92* 2.06* 1.96*  GFRNONAA 39* 33* 30* 32*  GFRAA 46* 38* 35* 37*    LIVER FUNCTION TESTS: Recent Labs    11/23/17 1032 11/24/17 0230 11/25/17 0500 11/26/17 0345  BILITOT 1.5* 1.5* 2.3* 2.0*  AST 26 18 18 30   ALT 19 15* 15* 22  ALKPHOS 67 53 52 52  PROT 8.8* 7.3 7.0 6.9  ALBUMIN 3.9 3.1* 2.9* 2.6*    Assessment: 77 year old male with recent history of acute cholecystitis in March of this year requiring percutaneous cholecystostomy on 11/25/2017.  Past medical history also significant for right renal mass, hypertension, chronic kidney disease, CHF, diabetes, hyperlipidemia and alcohol abuse.  He underwent follow-up cholangiogram on 12/27/2017 which revealed patent cystic and common bile ducts as well as cholelithiasis.  He presents today following evaluation by Dr. Dalbert Batman on 6/3 for cholecystostomy  drain removal.  Follow-up cholangiogram today reveals patent cystic and common bile ducts with cholelithiasis still present.  Imaging studies were reviewed by Dr. Laurence Ferrari.  Per patient request gallbladder drain removed in its entirety without immediate complications.  Gauze dressing applied over site.  Patient understands that there is approximately 20% risk per year of recurrent cholecystitis in this setting.  He was instructed to report to the ED should he experience increasing abdominal pain ,nausea ,vomiting.  Signed: D. Rowe Robert, PA-C 02/02/2018, 1:44 PM  Please refer to Dr. Katrinka Blazing attestation of this note for management and plan.      Patient ID: [name], male   DOB: 03/12/1941, 77 y.o.   MRN: [id number]

## 2018-03-01 ENCOUNTER — Ambulatory Visit: Payer: Self-pay | Admitting: Family Medicine

## 2018-03-04 ENCOUNTER — Other Ambulatory Visit: Payer: Self-pay

## 2018-03-04 ENCOUNTER — Encounter (HOSPITAL_COMMUNITY): Payer: Self-pay | Admitting: *Deleted

## 2018-03-04 ENCOUNTER — Emergency Department (HOSPITAL_COMMUNITY)
Admission: EM | Admit: 2018-03-04 | Discharge: 2018-03-04 | Disposition: A | Discharge status: Home / Self Care | Payer: Medicare Other | Attending: Emergency Medicine | Admitting: Emergency Medicine

## 2018-03-04 DIAGNOSIS — N183 Chronic kidney disease, stage 3 (moderate): Secondary | ICD-10-CM | POA: Insufficient documentation

## 2018-03-04 DIAGNOSIS — R339 Retention of urine, unspecified: Secondary | ICD-10-CM | POA: Insufficient documentation

## 2018-03-04 DIAGNOSIS — Z7982 Long term (current) use of aspirin: Secondary | ICD-10-CM | POA: Diagnosis not present

## 2018-03-04 DIAGNOSIS — Z87891 Personal history of nicotine dependence: Secondary | ICD-10-CM | POA: Insufficient documentation

## 2018-03-04 DIAGNOSIS — R300 Dysuria: Secondary | ICD-10-CM | POA: Diagnosis not present

## 2018-03-04 DIAGNOSIS — I5042 Chronic combined systolic (congestive) and diastolic (congestive) heart failure: Secondary | ICD-10-CM | POA: Insufficient documentation

## 2018-03-04 DIAGNOSIS — R350 Frequency of micturition: Secondary | ICD-10-CM | POA: Diagnosis not present

## 2018-03-04 DIAGNOSIS — I130 Hypertensive heart and chronic kidney disease with heart failure and stage 1 through stage 4 chronic kidney disease, or unspecified chronic kidney disease: Secondary | ICD-10-CM | POA: Insufficient documentation

## 2018-03-04 DIAGNOSIS — Z79899 Other long term (current) drug therapy: Secondary | ICD-10-CM | POA: Diagnosis not present

## 2018-03-04 DIAGNOSIS — E1122 Type 2 diabetes mellitus with diabetic chronic kidney disease: Secondary | ICD-10-CM | POA: Insufficient documentation

## 2018-03-04 LAB — COMPREHENSIVE METABOLIC PANEL
ALT: 26 U/L (ref 0–44)
AST: 29 U/L (ref 15–41)
Albumin: 4.1 g/dL (ref 3.5–5.0)
Alkaline Phosphatase: 69 U/L (ref 38–126)
Anion gap: 9 (ref 5–15)
BUN: 25 mg/dL — ABNORMAL HIGH (ref 8–23)
CO2: 22 mmol/L (ref 22–32)
Calcium: 9.2 mg/dL (ref 8.9–10.3)
Chloride: 106 mmol/L (ref 98–111)
Creatinine, Ser: 1.81 mg/dL — ABNORMAL HIGH (ref 0.61–1.24)
GFR calc Af Amer: 41 mL/min — ABNORMAL LOW (ref >60)
GFR calc non Af Amer: 35 mL/min — ABNORMAL LOW (ref >60)
Glucose, Bld: 103 mg/dL — ABNORMAL HIGH (ref 70–99)
Potassium: 4.9 mmol/L (ref 3.5–5.1)
Sodium: 137 mmol/L (ref 135–145)
Total Bilirubin: 1.4 mg/dL — ABNORMAL HIGH (ref 0.3–1.2)
Total Protein: 8.7 g/dL — ABNORMAL HIGH (ref 6.5–8.1)

## 2018-03-04 LAB — CBC
HCT: 37.3 % — ABNORMAL LOW (ref 39.0–52.0)
Hemoglobin: 11.1 g/dL — ABNORMAL LOW (ref 13.0–17.0)
MCH: 29 pg (ref 26.0–34.0)
MCHC: 29.8 g/dL — ABNORMAL LOW (ref 30.0–36.0)
MCV: 97.4 fL (ref 78.0–100.0)
Platelets: 239 10*3/uL (ref 150–400)
RBC: 3.83 MIL/uL — ABNORMAL LOW (ref 4.22–5.81)
RDW: 17.3 % — ABNORMAL HIGH (ref 11.5–15.5)
WBC: 8.3 10*3/uL (ref 4.0–10.5)

## 2018-03-04 LAB — URINALYSIS, ROUTINE W REFLEX MICROSCOPIC
Bacteria, UA: NONE SEEN
Bilirubin Urine: NEGATIVE
Glucose, UA: NEGATIVE mg/dL
Hgb urine dipstick: NEGATIVE
Ketones, ur: NEGATIVE mg/dL
Leukocytes, UA: NEGATIVE
Nitrite: NEGATIVE
Protein, ur: 30 mg/dL — AB
Specific Gravity, Urine: 1.013 (ref 1.005–1.030)
pH: 5 (ref 5.0–8.0)

## 2018-03-04 LAB — CBG MONITORING, ED: Glucose-Capillary: 102 mg/dL — ABNORMAL HIGH (ref 70–99)

## 2018-03-04 MED ORDER — LORAZEPAM 1 MG PO TABS
1.0000 mg | ORAL_TABLET | Freq: Once | ORAL | Status: AC
  Administered 2018-03-04: 1 mg via ORAL
  Filled 2018-03-04: qty 1

## 2018-03-04 MED ORDER — LIDOCAINE HCL URETHRAL/MUCOSAL 2 % EX GEL
1.0000 "application " | Freq: Once | CUTANEOUS | Status: AC
  Administered 2018-03-04: 1 via TOPICAL
  Filled 2018-03-04: qty 20

## 2018-03-04 NOTE — Discharge Instructions (Signed)
Keep your catheter in until you see the urologist.  Call the urology office on Monday for an appointment next week.  Phone number given.  Nurse will show you how to drain your catheter bag.

## 2018-03-04 NOTE — ED Notes (Signed)
Bladder Scan: 730 mL

## 2018-03-04 NOTE — ED Triage Notes (Signed)
The pt is voiding in small amounts since this am  Burning with urination  No bloody urine  He had a priocedure 1-2 weeks ago for ???

## 2018-03-04 NOTE — ED Provider Notes (Signed)
[facility] EMERGENCY DEPARTMENT Provider Note   CSN: [id number] Arrival date & time: 03/04/18  1431     History   Chief Complaint Chief Complaint  Patient presents with  . Dysuria    HPI [name] is a 77 y.o. male.  Voiding small amounts of urine with frequency and dysuria since this morning.  No fever, sweats, chills, flank pain, hematuria.  Status post diagnosis of cholecystitis with cholelithiasis in March 2019 with a subsequent percutaneous cholecystostomy.   This tube has been removed.  No known history of prostate disease.  Severity of symptoms is moderate.  Nothing makes symptoms better or worse.     Past Medical History:  Diagnosis Date  . Acute cholecystitis 11/23/2017  . Acute respiratory failure with hypoxia ([place])   . AKI (acute kidney injury) ([place])   . Alcohol abuse 02/26/2015  . Chronic combined systolic and diastolic CHF (congestive heart failure) ([place])   . CKD (chronic kidney disease), stage III ([place])   . Demand ischemia ([place])    a. minimally elevated troponin peak 0.11 in 2016, felt demand ischemia. Cath offered but pt wished to have done back in [place].  . Diabetes mellitus without complication ([place])   . Elevated troponin 02/26/2015  . Essential hypertension   . Hyperlipidemia   . Hypertension   . Renal lesion 11/23/2017  . Solitary kidney     Patient Active Problem List   Diagnosis Date Noted  . Acute cholecystitis 11/23/2017  . Renal lesion 11/23/2017  . Alcohol abuse 02/26/2015  . Hyperlipidemia   . Diabetes mellitus without complication ([place])   . CKD (chronic kidney disease), stage III ([place])   . Chronic systolic CHF (congestive heart failure) ([place])   . Essential hypertension     Past Surgical History:  Procedure Laterality Date  . IR CHOLANGIOGRAM EXISTING TUBE  12/27/2017  . IR PERC CHOLECYSTOSTOMY  11/25/2017  . IR RADIOLOGIST EVAL & MGMT  02/02/2018        Home Medications    Prior to Admission medications     Medication Sig Start Date End Date Taking? Authorizing Provider  acetaminophen (TYLENOL) 500 MG tablet Take 1,000 mg by mouth every 6 (six) hours as needed for moderate pain or headache.    [provider]  aspirin EC 81 MG tablet Take 81 mg by mouth daily.    [provider]  carvedilol (COREG) 6.25 MG tablet Take 1 tablet (6.25 mg total) by mouth 2 (two) times daily with a meal. 11/26/17   Rai, Ripudeep K, MD  Multiple Vitamin (MULTIVITAMIN) tablet Take 1 tablet by mouth daily.    [provider]  ondansetron (ZOFRAN ODT) 4 MG disintegrating tablet Take 1 tablet (4 mg total) by mouth every 8 (eight) hours as needed for nausea or vomiting. 11/26/17   Rai, Ripudeep K, MD  traMADol (ULTRAM) 50 MG tablet Take 1 tablet (50 mg total) by mouth every 6 (six) hours as needed for moderate pain or severe pain. 11/26/17 11/26/18  Mendel Corning, MD    Family History Family History  Problem Relation Age of Onset  . Other Mother        died in her sleep at 51  . CAD Neg Hx        neg hx premature CAD    Social History Social History   Tobacco Use  . Smoking status: Former Smoker    Types: Cigarettes    Last attempt to quit: 02/26/2008  Years since quitting: 10.0  . Smokeless tobacco: Never Used  Substance Use Topics  . Alcohol use: Yes    Comment: occ  . Drug use: No     Allergies   Patient has no known allergies.   Review of Systems Review of Systems  All other systems reviewed and are negative.    Physical Exam Updated Vital Signs BP (!) 174/86   Pulse 91   Temp 98.1 F (36.7 C)   Resp 20   Ht 5\' 11"  (1.803 m)   Wt 90.7 kg (200 lb)   SpO2 100%   BMI 27.89 kg/m   Physical Exam  Constitutional: He is oriented to person, place, and time. He appears well-developed and well-nourished.  HENT:  Head: Normocephalic and atraumatic.  Eyes: Conjunctivae are normal.  Neck: Neck supple.  Cardiovascular: Normal rate and regular rhythm.   Pulmonary/Chest: Effort normal and breath sounds normal.  Abdominal: Soft. Bowel sounds are normal.  Musculoskeletal: Normal range of motion.  Neurological: He is alert and oriented to person, place, and time.  Skin: Skin is warm and dry.  Psychiatric: He has a normal mood and affect. His behavior is normal.  Nursing note and vitals reviewed.    ED Treatments / Results  Labs (all labs ordered are listed, but only abnormal results are displayed) Labs Reviewed  COMPREHENSIVE METABOLIC PANEL - Abnormal; Notable for the following components:      Result Value   Glucose, Bld 103 (*)    BUN 25 (*)    Creatinine, Ser 1.81 (*)    Total Protein 8.7 (*)    Total Bilirubin 1.4 (*)    GFR calc non Af Amer 35 (*)    GFR calc Af Amer 41 (*)    All other components within normal limits  CBC - Abnormal; Notable for the following components:   RBC 3.83 (*)    Hemoglobin 11.1 (*)    HCT 37.3 (*)    MCHC 29.8 (*)    RDW 17.3 (*)    All other components within normal limits  URINALYSIS, ROUTINE W REFLEX MICROSCOPIC - Abnormal; Notable for the following components:   APPearance HAZY (*)    Protein, ur 30 (*)    All other components within normal limits  CBG MONITORING, ED - Abnormal; Notable for the following components:   Glucose-Capillary 102 (*)    All other components within normal limits    EKG None  Radiology No results found.  Procedures Procedures (including critical care time)  Medications Ordered in ED Medications  LORazepam (ATIVAN) tablet 1 mg (1 mg Oral Given 03/04/18 1741)  lidocaine (XYLOCAINE) 2 % jelly 1 application (1 application Topical Given 03/04/18 1741)     Initial Impression / Assessment and Plan / ED Course  I have reviewed the triage vital signs and the nursing notes.  Pertinent labs & imaging results that were available during my care of the patient were reviewed by me and considered in my medical decision making (see chart for details).     Patient  presents with frequency and urinating in small amounts.  A bladder scan revealed 900 cc of urine.  Foley catheter was placed which relieved his symptoms greatly.  Urinalysis shows no obvious infection.  Will discharge with catheter and follow-up with urology early in the week.  Final Clinical Impressions(s) / ED Diagnoses   Final diagnoses:  Urinary retention    ED Discharge Orders    None  Nat Christen, MD 03/04/18 Dorthula Perfect

## 2018-03-08 ENCOUNTER — Ambulatory Visit (INDEPENDENT_AMBULATORY_CARE_PROVIDER_SITE_OTHER): Payer: Medicare Other | Admitting: Cardiology

## 2018-03-08 ENCOUNTER — Encounter: Payer: Self-pay | Admitting: Cardiology

## 2018-03-08 VITALS — BP 156/86 | HR 88 | Ht 71.0 in | Wt 202.4 lb

## 2018-03-08 DIAGNOSIS — N183 Chronic kidney disease, stage 3 unspecified: Secondary | ICD-10-CM

## 2018-03-08 DIAGNOSIS — K8010 Calculus of gallbladder with chronic cholecystitis without obstruction: Secondary | ICD-10-CM | POA: Diagnosis not present

## 2018-03-08 DIAGNOSIS — D638 Anemia in other chronic diseases classified elsewhere: Secondary | ICD-10-CM

## 2018-03-08 DIAGNOSIS — I5042 Chronic combined systolic (congestive) and diastolic (congestive) heart failure: Secondary | ICD-10-CM

## 2018-03-08 DIAGNOSIS — E119 Type 2 diabetes mellitus without complications: Secondary | ICD-10-CM | POA: Diagnosis not present

## 2018-03-08 DIAGNOSIS — F1010 Alcohol abuse, uncomplicated: Secondary | ICD-10-CM

## 2018-03-08 DIAGNOSIS — I10 Essential (primary) hypertension: Secondary | ICD-10-CM

## 2018-03-08 MED ORDER — CARVEDILOL 12.5 MG PO TABS: 12.5000 mg | ORAL_TABLET | Freq: Two times a day (BID) | ORAL | 3 refills | Status: DC

## 2018-03-08 NOTE — Patient Instructions (Addendum)
Medication Instructions: Your physician has recommended you make the following change in your medication:  INCREASE: Carvedilol to 12.5 mg twice a day    Labwork: None Ordered  Procedures/Testing: None Ordered  Keep your follow up appointment with Dr. Marlou Patton on 05/16/18 at 10:00 AM, Please arrive 15 mins early prior to your appointment    Any Additional Special Instructions Will Be Listed Below (If Applicable).     If you need a refill on your cardiac medications before your next appointment, please call your pharmacy.   Heart Failure Heart failure is a condition in which the heart has trouble pumping blood because it has become weak or stiff. This means that the heart does not pump blood efficiently for the body to work well. For some people with heart failure, fluid may back up into the lungs and there may be swelling (edema) in the lower legs. Heart failure is usually a long-term (chronic) condition. It is important for you to take good care of yourself and follow the treatment plan from your health care provider. What are the causes? This condition is caused by some health problems, including:  High blood pressure (hypertension). Hypertension causes the heart muscle to work harder than normal. High blood pressure eventually causes the heart to become stiff and weak.  Coronary artery disease (CAD). CAD is the buildup of cholesterol and fat (plaques) in the arteries of the heart.  Heart attack (myocardial infarction). Injured tissue, which is caused by the heart attack, does not contract as well and the heart's ability to pump blood is weakened.  Abnormal heart valves. When the heart valves do not open and close properly, the heart muscle must pump harder to keep the blood flowing.  Heart muscle disease (cardiomyopathy or myocarditis). Heart muscle disease is damage to the heart muscle from a variety of causes, such as drug or alcohol abuse, infections, or unknown causes. These can  increase the risk of heart failure.  Lung disease. When the lungs do not work properly, the heart must work harder.  What increases the risk? Risk of heart failure increases as a person ages. This condition is also more likely to develop in people who:  Are overweight.  Are male.  Smoke or chew tobacco.  Abuse alcohol or illegal drugs.  Have taken medicines that can damage the heart, such as chemotherapy drugs.  Have diabetes. ? High blood sugar (glucose) is associated with high fat (lipid) levels in the blood. ? Diabetes can also damage tiny blood vessels that carry nutrients to the heart muscle.  Have abnormal heart rhythms.  Have thyroid problems.  Have low blood counts (anemia).  What are the signs or symptoms? Symptoms of this condition include:  Shortness of breath with activity, such as when climbing stairs.  Persistent cough.  Swelling of the feet, ankles, legs, or abdomen.  Unexplained weight gain.  Difficulty breathing when lying flat (orthopnea).  Waking from sleep because of the need to sit up and get more air.  Rapid heartbeat.  Fatigue and loss of energy.  Feeling light-headed, dizzy, or close to fainting.  Loss of appetite.  Nausea.  Increased urination during the night (nocturia).  Confusion.  How is this diagnosed? This condition is diagnosed based on:  Medical history, symptoms, and a physical exam.  Diagnostic tests, which may include: ? Echocardiogram. ? Electrocardiogram (ECG). ? Chest X-ray. ? Blood tests. ? Exercise stress test. ? Radionuclide scans. ? Cardiac catheterization and angiogram.  How is this treated? Treatment  for this condition is aimed at managing the symptoms of heart failure. Medicines, behavioral changes, or other treatments may be necessary to treat heart failure. Medicines These may include:  Angiotensin-converting enzyme (ACE) inhibitors. This type of medicine blocks the effects of a blood protein  called angiotensin-converting enzyme. ACE inhibitors relax (dilate) the blood vessels and help to lower blood pressure.  Angiotensin receptor blockers (ARBs). This type of medicine blocks the actions of a blood protein called angiotensin. ARBs dilate the blood vessels and help to lower blood pressure.  Water pills (diuretics). Diuretics cause the kidneys to remove salt and water from the blood. The extra fluid is removed through urination, leaving a lower volume of blood that the heart has to pump.  Beta blockers. These improve heart muscle strength and they prevent the heart from beating too quickly.  Digoxin. This increases the force of the heartbeat.  Healthy behavior changes These may include:  Reaching and maintaining a healthy weight.  Stopping smoking or chewing tobacco.  Eating heart-healthy foods.  Limiting or avoiding alcohol.  Stopping use of street drugs (illegal drugs).  Physical activity.  Other treatments These may include:  Surgery to open blocked coronary arteries or repair damaged heart valves.  Placement of a biventricular pacemaker to improve heart muscle function (cardiac resynchronization therapy). This device paces both the right ventricle and left ventricle.  Placement of a device to treat serious abnormal heart rhythms (implantable cardioverter defibrillator, or ICD).  Placement of a device to improve the pumping ability of the heart (left ventricular assist device, or LVAD).  Heart transplant. This can cure heart failure, and it is considered for certain patients who do not improve with other therapies.  Follow these instructions at home: Medicines  Take over-the-counter and prescription medicines only as told by your health care provider. Medicines are important in reducing the workload of your heart, slowing the progression of heart failure, and improving your symptoms. ? Do not stop taking your medicine unless your health care provider told you  to do that. ? Do not skip any dose of medicine. ? Refill your prescriptions before you run out of medicine. You need your medicines every day. Eating and drinking   Eat heart-healthy foods. Talk with a dietitian to make an eating plan that is right for you. ? Choose foods that contain no trans fat and are low in saturated fat and cholesterol. Healthy choices include fresh or frozen fruits and vegetables, fish, lean meats, legumes, fat-free or low-fat dairy products, and whole-grain or high-fiber foods. ? Limit salt (sodium) if directed by your health care provider. Sodium restriction may reduce symptoms of heart failure. Ask a dietitian to recommend heart-healthy seasonings. ? Use healthy cooking methods instead of frying. Healthy methods include roasting, grilling, broiling, baking, poaching, steaming, and stir-frying.  Limit your fluid intake if directed by your health care provider. Fluid restriction may reduce symptoms of heart failure. Lifestyle  Stop smoking or using chewing tobacco. Nicotine and tobacco can damage your heart and your blood vessels. Do not use nicotine gum or patches before talking to your health care provider.  Limit alcohol intake to no more than 1 drink per day for non-pregnant women and 2 drinks per day for men. One drink equals 12 oz of beer, 5 oz of wine, or 1 oz of hard liquor. ? Drinking more than that is harmful to your heart. Tell your health care provider if you drink alcohol several times a week. ? Talk with your health  care provider about whether any level of alcohol use is safe for you. ? If your heart has already been damaged by alcohol or you have severe heart failure, drinking alcohol should be stopped completely.  Stop use of illegal drugs.  Lose weight if directed by your health care provider. Weight loss may reduce symptoms of heart failure.  Do moderate physical activity if directed by your health care provider. People who are elderly and people  with severe heart failure should consult with a health care provider for physical activity recommendations. Monitor important information  Weigh yourself every day. Keeping track of your weight daily helps you to notice excess fluid sooner. ? Weigh yourself every morning after you urinate and before you eat breakfast. ? Wear the same amount of clothing each time you weigh yourself. ? Record your daily weight. Provide your health care provider with your weight record.  Monitor and record your blood pressure as told by your health care provider.  Check your pulse as told by your health care provider. Dealing with extreme temperatures  If the weather is extremely hot: ? Avoid vigorous physical activity. ? Use air conditioning or fans or seek a cooler location. ? Avoid caffeine and alcohol. ? Wear loose-fitting, lightweight, and light-colored clothing.  If the weather is extremely cold: ? Avoid vigorous physical activity. ? Layer your clothes. ? Wear mittens or gloves, a hat, and a scarf when you go outside. ? Avoid alcohol. General instructions  Manage other health conditions such as hypertension, diabetes, thyroid disease, or abnormal heart rhythms as told by your health care provider.  Learn to manage stress. If you need help to do this, ask your health care provider.  Plan rest periods when fatigued.  Get ongoing education and support as needed.  Participate in or seek rehabilitation as needed to maintain or improve independence and quality of life.  Stay up to date with immunizations. Keeping current on pneumococcal and influenza immunizations is especially important to prevent respiratory infections.  Keep all follow-up visits as told by your health care provider. This is important. Contact a health care provider if:  You have a rapid weight gain.  You have increasing shortness of breath that is unusual for you.  You are unable to participate in your usual physical  activities.  You tire easily.  You cough more than normal, especially with physical activity.  You have any swelling or more swelling in areas such as your hands, feet, ankles, or abdomen.  You are unable to sleep because it is hard to breathe.  You feel like your heart is beating quickly (palpitations).  You become dizzy or light-headed when you stand up. Get help right away if:  You have difficulty breathing.  You notice or your family notices a change in your awareness, such as having trouble staying awake or having difficulty with concentration.  You have pain or discomfort in your chest.  You have an episode of fainting (syncope). This information is not intended to replace advice given to you by your health care provider. Make sure you discuss any questions you have with your health care provider. Document Released: 08/16/2005 Document Revised: 04/20/2016 Document Reviewed: 03/10/2016 Elsevier Interactive Patient Education  [employer].

## 2018-03-08 NOTE — Progress Notes (Signed)
Cardiology Office Note:    Date:  03/08/2018   ID:  [name], DOB October 08, 1940, MRN [id number]  PCP:  [name], MD  Cardiologist:   Candee Furbish, MD   Referring MD: No ref. provider found   Chief Complaint  Patient presents with  . Congestive Heart Failure    History of Present Illness:    [name] is a 77 y.o. male who is being seen today for the evaluation of CHF at the request of No ref. provider found.   The patient has a past medical history significant for cholecystitis with cholelithiasis and percutaneous cholecystostomy 10/2017, chronic combined CHF, alcohol abuse, CKD stage 3, diabetes type 2, hypertension, hyperlipidemia, solitary kidney.  According to documented history in the chart the patient has a history of systolic dysfunction with EF 21% noted in February 2013.  This improved to EF 40-45% in 01/2012.  Nuclear stress test in 10/2011 showed EF 19% with dilated LV and global hypokinesis.  He was living in [place] at the time.  In 01/2015 he was admitted to [facility] for acute on chronic systolic CHF and had elevated troponin of 0.11 felt to be demand ischemia in setting of CHF and CKD.  An echocardiogram done 02/26/2015 showed EF 25-30% and he was diuresed with IV Lasix.  There was question of medication compliance.  Cath was offered at that time patient declined, wishing to pursue further evaluation by his doctors in [place].  Patient reported, at follow-up in [place] the decision was made to treat him with medicine.  He was again admitted in March with severe abdominal pain was treated for cholecystitis with cholelithiasis and percutaneous cholecystostomy.  He was seen by Dr. Marlou Porch during that admission for cardiac clearance.  His heart failure was stable at the time.  He had not been taking a beta-blocker and carvedilol was initiated.  He was not on an ACE inhibitor/ARB due to acute renal injury.   He is followed by [facility] surgery for his  cholecystitis/cholelithiasis.  They had recommended ultimate cholecystectomy once he was cleared from a cardiac standpoint, but the patient has deferred surgery.  He had a percutaneous drain in place which could have been used indefinitely to treat his condition, however he preferred to have that removed as well and see how he does.  It is felt that this could lead to recurrent infection, acute cholecystitis, hospitalization and possible emergency surgery.  The patient also has a small right renal mass followed by [facility].  Today he is here alone to establish cardiology care. He is moaning from back pain and discomfort with his urinary catheter as I enter the room. Better after his bladder empties. He says that catheter is draining well with no blood or clots. He reports that he tries to walk almost every day for 30  Minutes without any chest pain/pressure/tightness or shortness of breath. He denies orthopnea, PND or edema. No palpitations, dizziness or syncope.   He has been in [place] from [place] for about a year and a half. He has a new PCP, Kellie Shropshire and has been to see him twice. He used to drink alcohol heavily but now only about 2 Vodka drinks per day. He quit smoking 10-12 years ago.   LDL 113 in 09/2016 No flowsheet data found.   Past Medical History:  Diagnosis Date  . Acute cholecystitis 11/23/2017  . Acute respiratory failure with hypoxia ([place])   . AKI (acute kidney injury) ([place])   .  Alcohol abuse 02/26/2015  . Chronic combined systolic and diastolic CHF (congestive heart failure) ([place])   . CKD (chronic kidney disease), stage III ([place])   . Demand ischemia ([place])    a. minimally elevated troponin peak 0.11 in 2016, felt demand ischemia. Cath offered but pt wished to have done back in [place].  . Diabetes mellitus without complication ([place])   . Elevated troponin 02/26/2015  . Essential hypertension   . Hyperlipidemia   . Hypertension   . Renal lesion 11/23/2017  . Solitary kidney      Past Surgical History:  Procedure Laterality Date  . IR CHOLANGIOGRAM EXISTING TUBE  12/27/2017  . IR PERC CHOLECYSTOSTOMY  11/25/2017  . IR RADIOLOGIST EVAL & MGMT  02/02/2018    Current Medications: Current Meds  Medication Sig  . acetaminophen (TYLENOL) 500 MG tablet Take 1,000 mg by mouth every 6 (six) hours as needed for moderate pain or headache.  Marland Kitchen aspirin EC 81 MG tablet Take 81 mg by mouth daily.  . Multiple Vitamin (MULTIVITAMIN) tablet Take 1 tablet by mouth daily.  . [DISCONTINUED] carvedilol (COREG) 6.25 MG tablet Take 1 tablet (6.25 mg total) by mouth 2 (two) times daily with a meal.     Allergies:   Patient has no known allergies.   Social History   Socioeconomic History  . Marital status: Married    Spouse name: Not on file  . Number of children: Not on file  . Years of education: Not on file  . Highest education level: Not on file  Occupational History  . Not on file  Social Needs  . Financial resource strain: Not on file  . Food insecurity:    Worry: Not on file    Inability: Not on file  . Transportation needs:    Medical: Not on file    Non-medical: Not on file  Tobacco Use  . Smoking status: Former Smoker    Types: Cigarettes    Last attempt to quit: 02/26/2008    Years since quitting: 10.0  . Smokeless tobacco: Never Used  Substance and Sexual Activity  . Alcohol use: Yes    Comment: occ  . Drug use: No  . Sexual activity: Not on file  Lifestyle  . Physical activity:    Days per week: Not on file    Minutes per session: Not on file  . Stress: Not on file  Relationships  . Social connections:    Talks on phone: Not on file    Gets together: Not on file    Attends religious service: Not on file    Active member of club or organization: Not on file    Attends meetings of clubs or organizations: Not on file    Relationship status: Not on file  Other Topics Concern  . Not on file  Social History Narrative  . Not on file     Family  History: The patient's family history includes Other in his mother. There is no history of CAD. ROS:   Please see the history of present illness.     All other systems reviewed and are negative.  EKGs/Labs/Other Studies Reviewed:    The following studies were reviewed today:  Echocardiogram 11/25/2017 Study Conclusions - Left ventricle: The cavity size was normal. Wall thickness was   increased in a pattern of moderate LVH. Systolic function was   severely reduced. The estimated ejection fraction was in the   range of 20% to 25%. Diffuse hypokinesis. The study  is not   technically sufficient to allow evaluation of LV diastolic   function. - Mitral valve: Mildly thickened leaflets . There was trivial   regurgitation. - Left atrium: The atrium was normal in size. - Right ventricle: The cavity size was mildly dilated. - Tricuspid valve: There was trivial regurgitation. - Pulmonary arteries: PA peak pressure: 26 mm Hg (S). - Inferior vena cava: The vessel was dilated. The respirophasic   diameter changes were blunted (< 50%), consistent with elevated   central venous pressure.  Impressions: - Compared to a prior study in 2016, the LVEF is slightly lower at   20-25%.  Echo 02/26/2015: EF 25-30%, moderately dilated LV, diffuse hypokinesis, grade 1 diastolic dysfunction   EKG:  EKG is ordered today.  The ekg ordered today demonstrates NSR, LVH, 84 bpm, QRS 94, QTC 449  Recent Labs: 03/04/2018: ALT 26; BUN 25; Creatinine, Ser 1.81; Hemoglobin 11.1; Platelets 239; Potassium 4.9; Sodium 137   Recent Lipid Panel    Component Value Date/Time   CHOL 208 (H) 02/26/2015 0711   TRIG 146 02/26/2015 0711   HDL 44 02/26/2015 0711   CHOLHDL 4.7 02/26/2015 0711   VLDL 29 02/26/2015 0711   LDLCALC 135 (H) 02/26/2015 0711    Physical Exam:    VS:  BP (!) 156/86   Pulse 88   Ht 5\' 11"  (1.803 m)   Wt 202 lb 6.4 oz (91.8 kg)   SpO2 98%   BMI 28.23 kg/m     Wt Readings from Last 3  Encounters:  03/08/18 202 lb 6.4 oz (91.8 kg)  03/04/18 200 lb (90.7 kg)  11/25/17 212 lb 11.9 oz (96.5 kg)     GEN:  Elderly male walking with a cane, in no acute distress HEENT: Normal NECK: No JVD; No carotid bruits LYMPHATICS: No lymphadenopathy CARDIAC: RRR, no murmurs, rubs, gallops RESPIRATORY:  Clear to auscultation without rales, wheezing or rhonchi  ABDOMEN: Soft, non-tender, non-distended MUSCULOSKELETAL:  No edema; No deformity  SKIN: Warm and dry NEUROLOGIC:  Alert and oriented x 3 PSYCHIATRIC:  Normal affect   ASSESSMENT:    1. Chronic combined systolic and diastolic heart failure ([place])   2. Essential hypertension   3. CKD (chronic kidney disease), stage III ([place])   4. Anemia of chronic disease   5. Diabetes mellitus without complication (H[facility])    PLAN:    This patient's case was discussed in depth with Dr. Rayann Heman. The plan below was formulated per our discussion.  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  Possibly alcohol mediated and/or HTN related.  Current therapy includes carvedilol 6.25 mg twice daily. Not on ACE/ARB d/t renal function.  The patient has not undergone any ischemic evaluation except for a nuclear stress test in 2013 that showed EF 19% with dilated LV and global hypokinesis.  Cath was offered and 2016 but he declined. He feels that at his age he wants to just treat medically. He is currently having no angina and HF appears well compensated. Discussed with Dr. Rayann Heman, DOD. No testing currently indicated. Will have pt follow up with Dr. Marlou Porch at next appt for his additional input. Will titrate his carvedilol up as BP is elevated and HR 84.   Hypertension: BP elevated 156/86, was 136/84 at surgery office visit 01/30/18. Will uptitrate BB.   Cholelithiasis: Cholecystectomy recommended by the surgeon but it was deferred due to cardiac status and a drain was initially used.  The  patient has been refusing  surgery and now has the drain removed with no recurrent symptoms. Surgeon's notes indicate that there is a chance of need for cholecystectomy at some point.  Mr. Bignell would be at least moderate risk for major cardiac event perioperatively due to his LV dysfunction. He could proceed if surgery is necessary with close hemodynamic monitoring and possible heart failure decompensation.   CKD stage III: Baseline serum creatinine appears to be about 1.5-1.7, more recently 1.9.   Anemia of chronic disease: Likely related to CKD.  Hemoglobin 11.1 on 03/04/2018, at baseline.  Diabetes type II: Management per primary care provider. A1c 5.7 in 09/2016  Alcohol use: Pt used to be a heavy drinker but recently is down to 2 vodka drinks per day. Advised to try to reduce that to less than every day or ideally stop altogether.    Medication Adjustments/Labs and Tests Ordered: Current medicines are reviewed at length with the patient today.  Concerns regarding medicines are outlined above. Labs and tests ordered and medication changes are outlined in the patient instructions below:  Patient Instructions  Medication Instructions: Your physician has recommended you make the following change in your medication:  INCREASE: Carvedilol to 12.5 mg twice a day    Labwork: None Ordered  Procedures/Testing: None Ordered  Keep your follow up appointment with Dr. Marlou Porch on 05/16/18 at 10:00 AM, Please arrive 15 mins early prior to your appointment    Any Additional Special Instructions Will Be Listed Below (If Applicable).     If you need a refill on your cardiac medications before your next appointment, please call your pharmacy.   Heart Failure Heart failure is a condition in which the heart has trouble pumping blood because it has become weak or stiff. This means that the heart does not pump blood efficiently for the body to work well. For some people with heart failure, fluid may back up into the lungs and  there may be swelling (edema) in the lower legs. Heart failure is usually a long-term (chronic) condition. It is important for you to take good care of yourself and follow the treatment plan from your health care provider. What are the causes? This condition is caused by some health problems, including:  High blood pressure (hypertension). Hypertension causes the heart muscle to work harder than normal. High blood pressure eventually causes the heart to become stiff and weak.  Coronary artery disease (CAD). CAD is the buildup of cholesterol and fat (plaques) in the arteries of the heart.  Heart attack (myocardial infarction). Injured tissue, which is caused by the heart attack, does not contract as well and the heart's ability to pump blood is weakened.  Abnormal heart valves. When the heart valves do not open and close properly, the heart muscle must pump harder to keep the blood flowing.  Heart muscle disease (cardiomyopathy or myocarditis). Heart muscle disease is damage to the heart muscle from a variety of causes, such as drug or alcohol abuse, infections, or unknown causes. These can increase the risk of heart failure.  Lung disease. When the lungs do not work properly, the heart must work harder.  What increases the risk? Risk of heart failure increases as a person ages. This condition is also more likely to develop in people who:  Are overweight.  Are male.  Smoke or chew tobacco.  Abuse alcohol or illegal drugs.  Have taken medicines that can damage the heart, such as chemotherapy drugs.  Have diabetes. ? High  blood sugar (glucose) is associated with high fat (lipid) levels in the blood. ? Diabetes can also damage tiny blood vessels that carry nutrients to the heart muscle.  Have abnormal heart rhythms.  Have thyroid problems.  Have low blood counts (anemia).  What are the signs or symptoms? Symptoms of this condition include:  Shortness of breath with activity,  such as when climbing stairs.  Persistent cough.  Swelling of the feet, ankles, legs, or abdomen.  Unexplained weight gain.  Difficulty breathing when lying flat (orthopnea).  Waking from sleep because of the need to sit up and get more air.  Rapid heartbeat.  Fatigue and loss of energy.  Feeling light-headed, dizzy, or close to fainting.  Loss of appetite.  Nausea.  Increased urination during the night (nocturia).  Confusion.  How is this diagnosed? This condition is diagnosed based on:  Medical history, symptoms, and a physical exam.  Diagnostic tests, which may include: ? Echocardiogram. ? Electrocardiogram (ECG). ? Chest X-ray. ? Blood tests. ? Exercise stress test. ? Radionuclide scans. ? Cardiac catheterization and angiogram.  How is this treated? Treatment for this condition is aimed at managing the symptoms of heart failure. Medicines, behavioral changes, or other treatments may be necessary to treat heart failure. Medicines These may include:  Angiotensin-converting enzyme (ACE) inhibitors. This type of medicine blocks the effects of a blood protein called angiotensin-converting enzyme. ACE inhibitors relax (dilate) the blood vessels and help to lower blood pressure.  Angiotensin receptor blockers (ARBs). This type of medicine blocks the actions of a blood protein called angiotensin. ARBs dilate the blood vessels and help to lower blood pressure.  Water pills (diuretics). Diuretics cause the kidneys to remove salt and water from the blood. The extra fluid is removed through urination, leaving a lower volume of blood that the heart has to pump.  Beta blockers. These improve heart muscle strength and they prevent the heart from beating too quickly.  Digoxin. This increases the force of the heartbeat.  Healthy behavior changes These may include:  Reaching and maintaining a healthy weight.  Stopping smoking or chewing tobacco.  Eating heart-healthy  foods.  Limiting or avoiding alcohol.  Stopping use of street drugs (illegal drugs).  Physical activity.  Other treatments These may include:  Surgery to open blocked coronary arteries or repair damaged heart valves.  Placement of a biventricular pacemaker to improve heart muscle function (cardiac resynchronization therapy). This device paces both the right ventricle and left ventricle.  Placement of a device to treat serious abnormal heart rhythms (implantable cardioverter defibrillator, or ICD).  Placement of a device to improve the pumping ability of the heart (left ventricular assist device, or LVAD).  Heart transplant. This can cure heart failure, and it is considered for certain patients who do not improve with other therapies.  Follow these instructions at home: Medicines  Take over-the-counter and prescription medicines only as told by your health care provider. Medicines are important in reducing the workload of your heart, slowing the progression of heart failure, and improving your symptoms. ? Do not stop taking your medicine unless your health care provider told you to do that. ? Do not skip any dose of medicine. ? Refill your prescriptions before you run out of medicine. You need your medicines every day. Eating and drinking   Eat heart-healthy foods. Talk with a dietitian to make an eating plan that is right for you. ? Choose foods that contain no trans fat and are low in saturated fat  and cholesterol. Healthy choices include fresh or frozen fruits and vegetables, fish, lean meats, legumes, fat-free or low-fat dairy products, and whole-grain or high-fiber foods. ? Limit salt (sodium) if directed by your health care provider. Sodium restriction may reduce symptoms of heart failure. Ask a dietitian to recommend heart-healthy seasonings. ? Use healthy cooking methods instead of frying. Healthy methods include roasting, grilling, broiling, baking, poaching, steaming, and  stir-frying.  Limit your fluid intake if directed by your health care provider. Fluid restriction may reduce symptoms of heart failure. Lifestyle  Stop smoking or using chewing tobacco. Nicotine and tobacco can damage your heart and your blood vessels. Do not use nicotine gum or patches before talking to your health care provider.  Limit alcohol intake to no more than 1 drink per day for non-pregnant women and 2 drinks per day for men. One drink equals 12 oz of beer, 5 oz of wine, or 1 oz of hard liquor. ? Drinking more than that is harmful to your heart. Tell your health care provider if you drink alcohol several times a week. ? Talk with your health care provider about whether any level of alcohol use is safe for you. ? If your heart has already been damaged by alcohol or you have severe heart failure, drinking alcohol should be stopped completely.  Stop use of illegal drugs.  Lose weight if directed by your health care provider. Weight loss may reduce symptoms of heart failure.  Do moderate physical activity if directed by your health care provider. People who are elderly and people with severe heart failure should consult with a health care provider for physical activity recommendations. Monitor important information  Weigh yourself every day. Keeping track of your weight daily helps you to notice excess fluid sooner. ? Weigh yourself every morning after you urinate and before you eat breakfast. ? Wear the same amount of clothing each time you weigh yourself. ? Record your daily weight. Provide your health care provider with your weight record.  Monitor and record your blood pressure as told by your health care provider.  Check your pulse as told by your health care provider. Dealing with extreme temperatures  If the weather is extremely hot: ? Avoid vigorous physical activity. ? Use air conditioning or fans or seek a cooler location. ? Avoid caffeine and alcohol. ? Wear  loose-fitting, lightweight, and light-colored clothing.  If the weather is extremely cold: ? Avoid vigorous physical activity. ? Layer your clothes. ? Wear mittens or gloves, a hat, and a scarf when you go outside. ? Avoid alcohol. General instructions  Manage other health conditions such as hypertension, diabetes, thyroid disease, or abnormal heart rhythms as told by your health care provider.  Learn to manage stress. If you need help to do this, ask your health care provider.  Plan rest periods when fatigued.  Get ongoing education and support as needed.  Participate in or seek rehabilitation as needed to maintain or improve independence and quality of life.  Stay up to date with immunizations. Keeping current on pneumococcal and influenza immunizations is especially important to prevent respiratory infections.  Keep all follow-up visits as told by your health care provider. This is important. Contact a health care provider if:  You have a rapid weight gain.  You have increasing shortness of breath that is unusual for you.  You are unable to participate in your usual physical activities.  You tire easily.  You cough more than normal, especially with physical activity.  You have any swelling or more swelling in areas such as your hands, feet, ankles, or abdomen.  You are unable to sleep because it is hard to breathe.  You feel like your heart is beating quickly (palpitations).  You become dizzy or light-headed when you stand up. Get help right away if:  You have difficulty breathing.  You notice or your family notices a change in your awareness, such as having trouble staying awake or having difficulty with concentration.  You have pain or discomfort in your chest.  You have an episode of fainting (syncope). This information is not intended to replace advice given to you by your health care provider. Make sure you discuss any questions you have with your health care  provider. Document Released: 08/16/2005 Document Revised: 04/20/2016 Document Reviewed: 03/10/2016 Elsevier Interactive Patient Education  2018 [place], Daune Perch, NP  03/08/2018 10:02 AM    [place]

## 2018-03-10 DIAGNOSIS — E559 Vitamin D deficiency, unspecified: Secondary | ICD-10-CM | POA: Diagnosis not present

## 2018-03-10 DIAGNOSIS — M545 Low back pain: Secondary | ICD-10-CM | POA: Diagnosis not present

## 2018-03-10 DIAGNOSIS — M542 Cervicalgia: Secondary | ICD-10-CM | POA: Diagnosis not present

## 2018-03-10 DIAGNOSIS — M549 Dorsalgia, unspecified: Secondary | ICD-10-CM | POA: Diagnosis not present

## 2018-03-10 DIAGNOSIS — Z79899 Other long term (current) drug therapy: Secondary | ICD-10-CM | POA: Diagnosis not present

## 2018-03-13 DIAGNOSIS — R319 Hematuria, unspecified: Secondary | ICD-10-CM | POA: Diagnosis not present

## 2018-03-13 DIAGNOSIS — M545 Low back pain: Secondary | ICD-10-CM | POA: Diagnosis not present

## 2018-03-13 DIAGNOSIS — M542 Cervicalgia: Secondary | ICD-10-CM | POA: Diagnosis not present

## 2018-03-13 DIAGNOSIS — M549 Dorsalgia, unspecified: Secondary | ICD-10-CM | POA: Diagnosis not present

## 2018-03-13 DIAGNOSIS — R338 Other retention of urine: Secondary | ICD-10-CM | POA: Diagnosis not present

## 2018-03-15 DIAGNOSIS — R338 Other retention of urine: Secondary | ICD-10-CM | POA: Diagnosis not present

## 2018-03-16 DIAGNOSIS — R8279 Other abnormal findings on microbiological examination of urine: Secondary | ICD-10-CM | POA: Diagnosis not present

## 2018-03-16 DIAGNOSIS — R338 Other retention of urine: Secondary | ICD-10-CM | POA: Diagnosis not present

## 2018-03-22 DIAGNOSIS — N453 Epididymo-orchitis: Secondary | ICD-10-CM | POA: Diagnosis not present

## 2018-04-25 ENCOUNTER — Encounter: Payer: Self-pay | Admitting: Cardiology

## 2018-05-16 ENCOUNTER — Ambulatory Visit: Payer: Medicare Other | Admitting: Cardiology

## 2018-06-22 DIAGNOSIS — R972 Elevated prostate specific antigen [PSA]: Secondary | ICD-10-CM | POA: Diagnosis not present

## 2018-06-22 DIAGNOSIS — R351 Nocturia: Secondary | ICD-10-CM | POA: Diagnosis not present

## 2018-06-22 DIAGNOSIS — N401 Enlarged prostate with lower urinary tract symptoms: Secondary | ICD-10-CM | POA: Diagnosis not present

## 2018-08-14 DIAGNOSIS — J40 Bronchitis, not specified as acute or chronic: Secondary | ICD-10-CM | POA: Diagnosis not present

## 2018-08-14 DIAGNOSIS — M545 Low back pain: Secondary | ICD-10-CM | POA: Diagnosis not present

## 2018-08-14 DIAGNOSIS — E559 Vitamin D deficiency, unspecified: Secondary | ICD-10-CM | POA: Diagnosis not present

## 2018-08-16 ENCOUNTER — Other Ambulatory Visit: Payer: Self-pay

## 2018-08-16 ENCOUNTER — Emergency Department (HOSPITAL_COMMUNITY)
Admission: EM | Admit: 2018-08-16 | Discharge: 2018-08-16 | Disposition: A | Discharge status: Home / Self Care | Payer: Medicare Other | Attending: Emergency Medicine | Admitting: Emergency Medicine

## 2018-08-16 ENCOUNTER — Encounter (HOSPITAL_COMMUNITY): Payer: Self-pay | Admitting: Emergency Medicine

## 2018-08-16 ENCOUNTER — Emergency Department (HOSPITAL_COMMUNITY): Payer: Medicare Other

## 2018-08-16 ENCOUNTER — Emergency Department (HOSPITAL_COMMUNITY)
Admission: EM | Admit: 2018-08-16 | Discharge: 2018-08-17 | Disposition: A | Discharge status: Home / Self Care | Payer: Medicare Other | Source: Home / Self Care | Attending: Emergency Medicine | Admitting: Emergency Medicine

## 2018-08-16 DIAGNOSIS — R339 Retention of urine, unspecified: Secondary | ICD-10-CM

## 2018-08-16 DIAGNOSIS — I5042 Chronic combined systolic (congestive) and diastolic (congestive) heart failure: Secondary | ICD-10-CM | POA: Insufficient documentation

## 2018-08-16 DIAGNOSIS — N183 Chronic kidney disease, stage 3 (moderate): Secondary | ICD-10-CM | POA: Insufficient documentation

## 2018-08-16 DIAGNOSIS — E1122 Type 2 diabetes mellitus with diabetic chronic kidney disease: Secondary | ICD-10-CM

## 2018-08-16 DIAGNOSIS — I130 Hypertensive heart and chronic kidney disease with heart failure and stage 1 through stage 4 chronic kidney disease, or unspecified chronic kidney disease: Secondary | ICD-10-CM | POA: Insufficient documentation

## 2018-08-16 DIAGNOSIS — Z87891 Personal history of nicotine dependence: Secondary | ICD-10-CM | POA: Insufficient documentation

## 2018-08-16 DIAGNOSIS — N401 Enlarged prostate with lower urinary tract symptoms: Secondary | ICD-10-CM | POA: Insufficient documentation

## 2018-08-16 DIAGNOSIS — R0602 Shortness of breath: Secondary | ICD-10-CM | POA: Diagnosis not present

## 2018-08-16 DIAGNOSIS — Z79899 Other long term (current) drug therapy: Secondary | ICD-10-CM | POA: Insufficient documentation

## 2018-08-16 DIAGNOSIS — I110 Hypertensive heart disease with heart failure: Secondary | ICD-10-CM | POA: Diagnosis not present

## 2018-08-16 DIAGNOSIS — Z7982 Long term (current) use of aspirin: Secondary | ICD-10-CM | POA: Insufficient documentation

## 2018-08-16 DIAGNOSIS — I509 Heart failure, unspecified: Secondary | ICD-10-CM

## 2018-08-16 LAB — CBC
HCT: 37.3 % — ABNORMAL LOW (ref 39.0–52.0)
Hemoglobin: 11.3 g/dL — ABNORMAL LOW (ref 13.0–17.0)
MCH: 29.9 pg (ref 26.0–34.0)
MCHC: 30.3 g/dL (ref 30.0–36.0)
MCV: 98.7 fL (ref 80.0–100.0)
Platelets: 237 10*3/uL (ref 150–400)
RBC: 3.78 MIL/uL — ABNORMAL LOW (ref 4.22–5.81)
RDW: 15 % (ref 11.5–15.5)
WBC: 6.4 10*3/uL (ref 4.0–10.5)
nRBC: 0 % (ref 0.0–0.2)

## 2018-08-16 LAB — I-STAT TROPONIN, ED: Troponin i, poc: 0.06 ng/mL (ref 0.00–0.08)

## 2018-08-16 LAB — BASIC METABOLIC PANEL
Anion gap: 11 (ref 5–15)
BUN: 19 mg/dL (ref 8–23)
CO2: 23 mmol/L (ref 22–32)
Calcium: 9.2 mg/dL (ref 8.9–10.3)
Chloride: 109 mmol/L (ref 98–111)
Creatinine, Ser: 1.65 mg/dL — ABNORMAL HIGH (ref 0.61–1.24)
GFR calc Af Amer: 46 mL/min — ABNORMAL LOW (ref >60)
GFR calc non Af Amer: 40 mL/min — ABNORMAL LOW (ref >60)
Glucose, Bld: 109 mg/dL — ABNORMAL HIGH (ref 70–99)
Potassium: 4.2 mmol/L (ref 3.5–5.1)
Sodium: 143 mmol/L (ref 135–145)

## 2018-08-16 LAB — BRAIN NATRIURETIC PEPTIDE: B Natriuretic Peptide: 1774.3 pg/mL — ABNORMAL HIGH (ref 0.0–100.0)

## 2018-08-16 MED ORDER — FUROSEMIDE 10 MG/ML IJ SOLN
40.0000 mg | Freq: Once | INTRAMUSCULAR | Status: AC
  Administered 2018-08-16: 40 mg via INTRAVENOUS
  Filled 2018-08-16: qty 4

## 2018-08-16 MED ORDER — FUROSEMIDE 20 MG PO TABS: 20.0000 mg | ORAL_TABLET | Freq: Every day | ORAL | 0 refills | Status: DC

## 2018-08-16 NOTE — ED Provider Notes (Signed)
[facility] Emergency Department Provider Note MRN:  [id number]  Arrival date & time: 08/16/18     Chief Complaint   Shortness of Breath   History of Present Illness   [name] is a 77 y.o. year-old male with a history of CKD, CHF, diabetes presenting to the ED with chief complaint of shortness of breath.  Patient has been experiencing nasal congestion for 1 week.  Was prescribed Keflex by PCP.  For the past week he is also noticed mildly progressively worsening shortness of breath, only when trying to sleep at night when laying flat.  Feels much better sitting up.  Denies headache or vision change, no chest pain, no cough, no abdominal pain.  Has been diagnosed with CHF in the past, does not take a fluid pill.  Review of Systems  A complete 10 system review of systems was obtained and all systems are negative except as noted in the HPI and PMH.   Patient's Health History    Past Medical History:  Diagnosis Date  . Acute cholecystitis 11/23/2017  . Acute respiratory failure with hypoxia ([place])   . AKI (acute kidney injury) ([place])   . Alcohol abuse 02/26/2015  . Chronic combined systolic and diastolic CHF (congestive heart failure) ([place])   . CKD (chronic kidney disease), stage III ([place])   . Demand ischemia ([place])    a. minimally elevated troponin peak 0.11 in 2016, felt demand ischemia. Cath offered but pt wished to have done back in [place].  . Diabetes mellitus without complication ([place])   . Elevated troponin 02/26/2015  . Essential hypertension   . Hyperlipidemia   . Hypertension   . Renal lesion 11/23/2017  . Solitary kidney     Past Surgical History:  Procedure Laterality Date  . IR CHOLANGIOGRAM EXISTING TUBE  12/27/2017  . IR PERC CHOLECYSTOSTOMY  11/25/2017  . IR RADIOLOGIST EVAL & MGMT  02/02/2018    Family History  Problem Relation Age of Onset  . Other Mother        died in her sleep at 27  . CAD Neg Hx        neg hx premature CAD    Social  History   Socioeconomic History  . Marital status: Married    Spouse name: Not on file  . Number of children: Not on file  . Years of education: Not on file  . Highest education level: Not on file  Occupational History  . Not on file  Social Needs  . Financial resource strain: Not on file  . Food insecurity:    Worry: Not on file    Inability: Not on file  . Transportation needs:    Medical: Not on file    Non-medical: Not on file  Tobacco Use  . Smoking status: Former Smoker    Types: Cigarettes    Last attempt to quit: 02/26/2008    Years since quitting: 10.4  . Smokeless tobacco: Never Used  Substance and Sexual Activity  . Alcohol use: Yes    Comment: occ  . Drug use: No  . Sexual activity: Not on file  Lifestyle  . Physical activity:    Days per week: Not on file    Minutes per session: Not on file  . Stress: Not on file  Relationships  . Social connections:    Talks on phone: Not on file    Gets together: Not on file    Attends religious service: Not on file  Active member of club or organization: Not on file    Attends meetings of clubs or organizations: Not on file    Relationship status: Not on file  . Intimate partner violence:    Fear of current or ex partner: Not on file    Emotionally abused: Not on file    Physically abused: Not on file    Forced sexual activity: Not on file  Other Topics Concern  . Not on file  Social History Narrative  . Not on file     Physical Exam  Vital Signs and Nursing Notes reviewed Vitals:   08/16/18 1100 08/16/18 1115  BP: (!) 155/86 138/80  Pulse: 88 86  Resp: (!) 23 (!) 21  Temp:    SpO2: 94% 94%    CONSTITUTIONAL: Well-appearing, NAD NEURO:  Alert and oriented x 3, no focal deficits EYES:  eyes equal and reactive ENT/NECK:  no LAD, no JVD CARDIO: Regular rate, well-perfused, normal S1 and S2 PULM:  CTAB no wheezing or rhonchi GI/GU:  normal bowel sounds, non-distended, non-tender MSK/SPINE:  No gross  deformities, mild edema to bilateral lower extremities SKIN:  no rash, atraumatic PSYCH:  Appropriate speech and behavior  Diagnostic and Interventional Summary    EKG Interpretation  Date/Time:  Wednesday August 16 2018 09:14:57 EST Ventricular Rate:  98 PR Interval:    QRS Duration: 99 QT Interval:  356 QTC Calculation: 455 R Axis:   2 Text Interpretation:  Sinus rhythm Multiple ventricular premature complexes Probable left atrial enlargement LVH with secondary repolarization abnormality Anterior infarct, old Confirmed by Gerlene Fee [phone]) on 08/16/2018 2:10:23 PM      Labs Reviewed  CBC - Abnormal; Notable for the following components:      Result Value   RBC 3.78 (*)    Hemoglobin 11.3 (*)    HCT 37.3 (*)    All other components within normal limits  BASIC METABOLIC PANEL - Abnormal; Notable for the following components:   Glucose, Bld 109 (*)    Creatinine, Ser 1.65 (*)    GFR calc non Af Amer 40 (*)    GFR calc Af Amer 46 (*)    All other components within normal limits  BRAIN NATRIURETIC PEPTIDE - Abnormal; Notable for the following components:   B Natriuretic Peptide 1,774.3 (*)    All other components within normal limits  I-STAT TROPONIN, ED    DG Chest 2 View  Final Result      Medications  furosemide (LASIX) injection 40 mg (40 mg Intravenous Given 08/16/18 1240)     Procedures Critical Care  ED Course and Medical Decision Making  I have reviewed the triage vital signs and the nursing notes.  Pertinent labs & imaging results that were available during my care of the patient were reviewed by me and considered in my medical decision making (see below for details).  Favoring mild CHF exacerbation in this 77 year old male with history of the same endorsing orthopnea for the past 1 to 2 weeks.  No evidence of DVT on exam, no hypoxia, little to no concern for PE at this time.  Anticipating trial of Lasix and discharge.  Labs largely reassuring.   Patient provided with 40 mg IV Lasix with great urine output, nearly 500 cc.  Patient continues to look and feel well, no chest pain, no shortness of breath while sitting upright, no increased work of breathing.  He is requesting discharge, agrees to follow-up as soon as possible with his PCP  or his cardiologist.  Provided with prescription for Lasix.  Strict return precautions.  Barth Kirks. [place]onia Small, [place] [email or web address]  Final Clinical Impressions(s) / ED Diagnoses     ICD-10-CM   1. Congestive heart failure, unspecified HF chronicity, unspecified heart failure type ([place]) I50.9   2. SOB (shortness of breath) R06.02 DG Chest 2 View    DG Chest 2 View    ED Discharge Orders         Ordered    furosemide (LASIX) 20 MG tablet  Daily,   Status:  Discontinued     08/16/18 1407    furosemide (LASIX) 20 MG tablet  Daily     08/16/18 1410             Maudie Flakes, MD 08/16/18 1413

## 2018-08-16 NOTE — ED Triage Notes (Signed)
Pt BIB [facility] for urinary retention. Pt was evaluated earlier today for shortness of breath and was given lasix. No trouble earlier. Approx 2.5-3 hours ago patient became unable to urinate

## 2018-08-16 NOTE — ED Triage Notes (Addendum)
PT reports SOB. Sleeps in recliner. HTN, CHF. PT reports he saw PCP for cold that was persistent and was given antibiotics- no chest xray done. Reports he takes meds and is compliant but has not taken today.

## 2018-08-16 NOTE — ED Notes (Signed)
ED Provider at bedside. 

## 2018-08-16 NOTE — ED Provider Notes (Signed)
TIME SEEN: 11:31 PM  CHIEF COMPLAINT: Urinary retention  HPI: Patient is a 77 year old male with history of CHF, CKD, hypertension, hyperlipidemia, diabetes, BPH who presents to the emergency department with urinary retention.  Was seen here earlier today for shortness of breath and diagnosed with a CHF exacerbation.  Given IV Lasix and was able to diurese while in the ED and states he felt better and was discharged home.  States once going home he was unable to urinate for several hours and started having suprapubic pressure and discomfort.  He has had urinary retention in the past requiring Foley catheter.  He has an outpatient urologist.  Denies recent dysuria or hematuria.  No back pain, numbness, tingling, weakness or incontinence.  No new medications other than Lasix.  ROS: See HPI Constitutional: no fever  Eyes: no drainage  ENT: no runny nose   Cardiovascular:  no chest pain  Resp: Shortness of breath earlier today that has resolved GI: no vomiting GU: no dysuria Integumentary: no rash  Allergy: no hives  Musculoskeletal: no leg swelling  Neurological: no slurred speech ROS otherwise negative  PAST MEDICAL HISTORY/PAST SURGICAL HISTORY:  Past Medical History:  Diagnosis Date  . Acute cholecystitis 11/23/2017  . Acute respiratory failure with hypoxia ([place])   . AKI (acute kidney injury) ([place])   . Alcohol abuse 02/26/2015  . Chronic combined systolic and diastolic CHF (congestive heart failure) ([place])   . CKD (chronic kidney disease), stage III ([place])   . Demand ischemia ([place])    a. minimally elevated troponin peak 0.11 in 2016, felt demand ischemia. Cath offered but pt wished to have done back in [place].  . Diabetes mellitus without complication ([place])   . Elevated troponin 02/26/2015  . Essential hypertension   . Hyperlipidemia   . Hypertension   . Renal lesion 11/23/2017  . Solitary kidney     MEDICATIONS:  Prior to Admission medications   Medication Sig Start Date End Date  Taking? Authorizing Provider  acetaminophen (TYLENOL) 500 MG tablet Take 1,000 mg by mouth every 6 (six) hours as needed for moderate pain or headache.    [provider]  aspirin EC 81 MG tablet Take 81 mg by mouth daily.    [provider]  carvedilol (COREG) 12.5 MG tablet Take 1 tablet (12.5 mg total) by mouth 2 (two) times daily. 03/08/18 08/16/18  Daune Perch, NP  cephALEXin (KEFLEX) 500 MG capsule Take 1,000 mg by mouth 2 (two) times daily.    [provider]  dextromethorphan-guaiFENesin (MUCINEX DM) 30-600 MG 12hr tablet Take 1 tablet by mouth daily as needed for cough.    [provider]  furosemide (LASIX) 20 MG tablet Take 1 tablet (20 mg total) by mouth daily. 08/16/18   Maudie Flakes, MD  Multiple Vitamin (MULTIVITAMIN) tablet Take 1 tablet by mouth daily.    [provider]    ALLERGIES:  No Known Allergies  SOCIAL HISTORY:  Social History   Tobacco Use  . Smoking status: Former Smoker    Types: Cigarettes    Last attempt to quit: 02/26/2008    Years since quitting: 10.4  . Smokeless tobacco: Never Used  Substance Use Topics  . Alcohol use: Yes    Comment: occ    FAMILY HISTORY: Family History  Problem Relation Age of Onset  . Other Mother        died in her sleep at 23  . CAD Neg Hx  neg hx premature CAD    EXAM: BP (!) 171/83   Pulse 95   Resp (!) 24   Ht 5\' 11"  (1.803 m)   Wt 92.1 kg   SpO2 97%   BMI 28.31 kg/m  CONSTITUTIONAL: Alert and oriented and responds appropriately to questions. Well-appearing; well-nourished HEAD: Normocephalic EYES: Conjunctivae clear, pupils appear equal, EOMI ENT: normal nose; moist mucous membranes NECK: Supple, no meningismus, no nuchal rigidity, no LAD  CARD: RRR; S1 and S2 appreciated; no murmurs, no clicks, no rubs, no gallops RESP: Normal chest excursion without splinting or tachypnea; breath sounds clear and equal bilaterally; no wheezes, no rhonchi, no  rales, no hypoxia or respiratory distress, speaking full sentences ABD/GI: Normal bowel sounds; non-distended; soft, non-tender, no rebound, no guarding, no peritoneal signs, no hepatosplenomegaly BACK:  The back appears normal and is non-tender to palpation, there is no CVA tenderness EXT: Normal ROM in all joints; non-tender to palpation; no edema; normal capillary refill; no cyanosis, no calf tenderness or swelling    SKIN: Normal color for age and race; warm; no rash NEURO: Moves all extremities equally PSYCH: The patient's mood and manner are appropriate. Grooming and personal hygiene are appropriate.  MEDICAL DECISION MAKING: Patient here with urinary retention.  Has history of the same.  Patient had over 800 mL of urine in his bladder on bladder scan.  Foley catheter placed by nursing staff without difficulty.  Patient now reports feeling much better.  Urinalysis sent shows no sign of infection.  Suspect BPH as the cause of his urinary retention today as he has a history of the same.  He will follow-up with his outpatient urologist.  Reports no chest pain or shortness of breath currently.  States he is feeling great and ready for discharge home.   At this time, I do not feel there is any life-threatening condition present. I have reviewed and discussed all results (EKG, imaging, lab, urine as appropriate) and exam findings with patient/family. I have reviewed nursing notes and appropriate previous records.  I feel the patient is safe to be discharged home without further emergent workup and can continue workup as an outpatient as needed. Discussed usual and customary return precautions. Patient/family verbalize understanding and are comfortable with this plan.  Outpatient follow-up has been provided as needed. All questions have been answered.    Ward, Delice Bison, DO 08/17/18 [phone]

## 2018-08-16 NOTE — Discharge Instructions (Addendum)
You were evaluated in the Emergency Department and after careful evaluation, we did not find any emergent condition requiring admission or further testing in the hospital.  Your symptoms today seem to be due to a mild amount of fluid in the lungs related to congestive heart failure.  He responded well to the Lasix medication here in the emergency department.  Please take the medication provided as directed.  It is very important that you follow-up with your primary care provider or cardiologist soon as possible to discuss continuing this medication long-term.  Please return to the Emergency Department if you experience any worsening of your condition.  We encourage you to follow up with a primary care provider.  Thank you for allowing [place] to be a part of your care.

## 2018-08-17 LAB — URINALYSIS, ROUTINE W REFLEX MICROSCOPIC
Bilirubin Urine: NEGATIVE
Glucose, UA: NEGATIVE mg/dL
Ketones, ur: NEGATIVE mg/dL
Leukocytes, UA: NEGATIVE
Nitrite: NEGATIVE
Protein, ur: 30 mg/dL — AB
Specific Gravity, Urine: 1.01 (ref 1.005–1.030)
pH: 5 (ref 5.0–8.0)

## 2018-08-17 NOTE — ED Notes (Signed)
Pt understood dc material. NAD noted. 

## 2018-08-18 LAB — URINE CULTURE: Culture: NO GROWTH

## 2018-08-24 DIAGNOSIS — R972 Elevated prostate specific antigen [PSA]: Secondary | ICD-10-CM | POA: Diagnosis not present

## 2018-08-24 DIAGNOSIS — R338 Other retention of urine: Secondary | ICD-10-CM | POA: Diagnosis not present

## 2018-09-03 ENCOUNTER — Encounter (HOSPITAL_COMMUNITY): Payer: Self-pay | Admitting: Emergency Medicine

## 2018-09-03 ENCOUNTER — Emergency Department (HOSPITAL_COMMUNITY)
Admission: EM | Admit: 2018-09-03 | Discharge: 2018-09-03 | Disposition: A | Discharge status: Home / Self Care | Payer: Medicare Other | Attending: Emergency Medicine | Admitting: Emergency Medicine

## 2018-09-03 DIAGNOSIS — I5042 Chronic combined systolic (congestive) and diastolic (congestive) heart failure: Secondary | ICD-10-CM | POA: Diagnosis not present

## 2018-09-03 DIAGNOSIS — Z7982 Long term (current) use of aspirin: Secondary | ICD-10-CM | POA: Insufficient documentation

## 2018-09-03 DIAGNOSIS — Z79899 Other long term (current) drug therapy: Secondary | ICD-10-CM | POA: Diagnosis not present

## 2018-09-03 DIAGNOSIS — I130 Hypertensive heart and chronic kidney disease with heart failure and stage 1 through stage 4 chronic kidney disease, or unspecified chronic kidney disease: Secondary | ICD-10-CM | POA: Diagnosis not present

## 2018-09-03 DIAGNOSIS — N183 Chronic kidney disease, stage 3 (moderate): Secondary | ICD-10-CM | POA: Insufficient documentation

## 2018-09-03 DIAGNOSIS — R339 Retention of urine, unspecified: Secondary | ICD-10-CM | POA: Diagnosis not present

## 2018-09-03 DIAGNOSIS — Z87891 Personal history of nicotine dependence: Secondary | ICD-10-CM | POA: Diagnosis not present

## 2018-09-03 LAB — URINALYSIS, ROUTINE W REFLEX MICROSCOPIC
Bilirubin Urine: NEGATIVE
Glucose, UA: NEGATIVE mg/dL
Ketones, ur: NEGATIVE mg/dL
Leukocytes, UA: NEGATIVE
Nitrite: NEGATIVE
Protein, ur: 100 mg/dL — AB
RBC / HPF: >50 RBC/hpf — ABNORMAL HIGH (ref 0–5)
Specific Gravity, Urine: 1.012 (ref 1.005–1.030)
pH: 5 (ref 5.0–8.0)

## 2018-09-03 LAB — CBC WITH DIFFERENTIAL/PLATELET
Abs Immature Granulocytes: 0.05 10*3/uL (ref 0.00–0.07)
Basophils Absolute: 0 10*3/uL (ref 0.0–0.1)
Basophils Relative: 0 %
Eosinophils Absolute: 0 10*3/uL (ref 0.0–0.5)
Eosinophils Relative: 0 %
HCT: 36.6 % — ABNORMAL LOW (ref 39.0–52.0)
Hemoglobin: 11.4 g/dL — ABNORMAL LOW (ref 13.0–17.0)
Immature Granulocytes: 1 %
Lymphocytes Relative: 9 %
Lymphs Abs: 0.9 10*3/uL (ref 0.7–4.0)
MCH: 29.8 pg (ref 26.0–34.0)
MCHC: 31.1 g/dL (ref 30.0–36.0)
MCV: 95.8 fL (ref 80.0–100.0)
Monocytes Absolute: 0.4 10*3/uL (ref 0.1–1.0)
Monocytes Relative: 4 %
Neutro Abs: 7.9 10*3/uL — ABNORMAL HIGH (ref 1.7–7.7)
Neutrophils Relative %: 86 %
Platelets: 214 10*3/uL (ref 150–400)
RBC: 3.82 MIL/uL — ABNORMAL LOW (ref 4.22–5.81)
RDW: 13.6 % (ref 11.5–15.5)
WBC: 9.3 10*3/uL (ref 4.0–10.5)
nRBC: 0 % (ref 0.0–0.2)

## 2018-09-03 LAB — BASIC METABOLIC PANEL
Anion gap: 11 (ref 5–15)
BUN: 21 mg/dL (ref 8–23)
CO2: 21 mmol/L — ABNORMAL LOW (ref 22–32)
Calcium: 8.8 mg/dL — ABNORMAL LOW (ref 8.9–10.3)
Chloride: 109 mmol/L (ref 98–111)
Creatinine, Ser: 1.56 mg/dL — ABNORMAL HIGH (ref 0.61–1.24)
GFR calc Af Amer: 50 mL/min — ABNORMAL LOW (ref >60)
GFR calc non Af Amer: 43 mL/min — ABNORMAL LOW (ref >60)
Glucose, Bld: 136 mg/dL — ABNORMAL HIGH (ref 70–99)
Potassium: 3.7 mmol/L (ref 3.5–5.1)
Sodium: 141 mmol/L (ref 135–145)

## 2018-09-03 MED ORDER — MORPHINE SULFATE (PF) 4 MG/ML IV SOLN
4.0000 mg | Freq: Once | INTRAVENOUS | Status: AC
  Administered 2018-09-03: 4 mg via INTRAVENOUS
  Filled 2018-09-03: qty 1

## 2018-09-03 MED ORDER — FINASTERIDE 5 MG PO TABS: 5.0000 mg | ORAL_TABLET | Freq: Every day | ORAL | 0 refills | Status: DC

## 2018-09-03 MED ORDER — TAMSULOSIN HCL 0.4 MG PO CAPS: 0.4000 mg | ORAL_CAPSULE | Freq: Every day | ORAL | 0 refills | Status: DC

## 2018-09-03 NOTE — Consult Note (Signed)
Reason for Consult: Massive Prostate With Recurrent Urinary Retention, Chronic Kidney Disease, Elevated PSA  Referring Physician: Cyril Mourning Ward DO  [name] is an 78 y.o. male.   HPI:   1 - Massive Prostate With Recurrent Urinary Retention - 146mL prostate (bilobar) by CT 2019. Urinary retention 02/2018, 07/2018, and now 08/2018 requiring catheterization, has passed subsequent trials of void on tamsulosin.  2 - Chronic Kidney Disease - Cr 1.6-1.8 at [place]. CT 2019 no hydro. Long h/o HTN.  3 - Elevated PSA - PSA 70 05/2018 around time of retention episode, no prior for comparison.  PMH sig for CHF with recurrent admissions, HTN. 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   Today " [name] " is seen as emergent consult for recurrent urinary retention, ER staff unable to place catheter.   Past Medical History:  Diagnosis Date  . Acute cholecystitis 11/23/2017  . Acute respiratory failure with hypoxia ([place])   . AKI (acute kidney injury) ([place])   . Alcohol abuse 02/26/2015  . Chronic combined systolic and diastolic CHF (congestive heart failure) ([place])   . CKD (chronic kidney disease), stage III ([place])   . Demand ischemia ([place])    a. minimally elevated troponin peak 0.11 in 2016, felt demand ischemia. Cath offered but pt wished to have done back in [place].  . Diabetes mellitus without complication ([place])   . Elevated troponin 02/26/2015  . Essential hypertension   . Hyperlipidemia   . Hypertension   . Renal lesion 11/23/2017  . Solitary kidney     Past Surgical History:  Procedure Laterality Date  . IR CHOLANGIOGRAM EXISTING TUBE  12/27/2017  . IR PERC CHOLECYSTOSTOMY  11/25/2017  . IR RADIOLOGIST EVAL & MGMT  02/02/2018    Family History  Problem Relation Age of Onset  . Other Mother        died in her sleep at 24  . CAD Neg Hx        neg hx premature CAD    Social History:  reports that he quit smoking about 10 years ago. His smoking use included  cigarettes. He has never used smokeless tobacco. He reports current alcohol use. He reports that he does not use drugs.  Allergies: No Known Allergies  Medications: I have reviewed the patient's current medications.  Results for orders placed or performed during the hospital encounter of 09/03/18 (from the past 48 hour(s))  CBC with Differential     Status: Abnormal   Collection Time: 09/03/18  4:06 PM  Result Value Ref Range   WBC 9.3 4.0 - 10.5 K/uL   RBC 3.82 (L) 4.22 - 5.81 MIL/uL   Hemoglobin 11.4 (L) 13.0 - 17.0 g/dL   HCT 36.6 (L) 39.0 - 52.0 %   MCV 95.8 80.0 - 100.0 fL   MCH 29.8 26.0 - 34.0 pg   MCHC 31.1 30.0 - 36.0 g/dL   RDW 13.6 11.5 - 15.5 %   Platelets 214 150 - 400 K/uL   nRBC 0.0 0.0 - 0.2 %   Neutrophils Relative % 86 %   Neutro Abs 7.9 (H) 1.7 - 7.7 K/uL   Lymphocytes Relative 9 %   Lymphs Abs 0.9 0.7 - 4.0 K/uL   Monocytes Relative 4 %   Monocytes Absolute 0.4 0.1 - 1.0 K/uL   Eosinophils Relative 0 %   Eosinophils Absolute 0.0 0.0 - 0.5 K/uL   Basophils Relative 0 %   Basophils Absolute 0.0 0.0 - 0.1  K/uL   Immature Granulocytes 1 %   Abs Immature Granulocytes 0.05 0.00 - 0.07 K/uL    Comment: Performed at [facility], [place] [address], [place], [place] [address]  Basic metabolic panel     Status: Abnormal   Collection Time: 09/03/18  4:06 PM  Result Value Ref Range   Sodium 141 135 - 145 mmol/L   Potassium 3.7 3.5 - 5.1 mmol/L   Chloride 109 98 - 111 mmol/L   CO2 21 (L) 22 - 32 mmol/L   Glucose, Bld 136 (H) 70 - 99 mg/dL   BUN 21 8 - 23 mg/dL   Creatinine, Ser 1.56 (H) 0.61 - 1.24 mg/dL   Calcium 8.8 (L) 8.9 - 10.3 mg/dL   GFR calc non Af Amer 43 (L) >60 mL/min   GFR calc Af Amer 50 (L) >60 mL/min   Anion gap 11 5 - 15    Comment: Performed at [facility] [address] [place], [place] [address]    No results found.  Review of Systems  Constitutional: Negative.  Negative for chills and fever.  HENT: Negative.   Eyes:  Negative.   Respiratory: Negative.   Cardiovascular: Negative.  Negative for chest pain.  Gastrointestinal: Negative.   Genitourinary: Positive for urgency.  Musculoskeletal: Negative.   Skin: Negative.   Neurological: Negative.   Endo/Heme/Allergies: Negative.   Psychiatric/Behavioral: Negative.    Blood pressure (!) 200/110, pulse (!) 106, temperature (!) 97.3 F (36.3 C), temperature source Oral, resp. rate (!) 22, SpO2 98 %. Physical Exam  Constitutional: He appears well-developed.  In visible pain from urinary retention.   HENT:  Head: Normocephalic.  Eyes: Pupils are equal, round, and reactive to light.  Neck: Normal range of motion.  Cardiovascular: Normal rate.  Respiratory: Effort normal.  GI: Soft.  Genitourinary:    Genitourinary Comments: Some blood at meatus.    Musculoskeletal: Normal range of motion.     Comments: No severe LE edema at present.   Neurological: He is alert.  Skin: Skin is warm.  Psychiatric: He has a normal mood and affect.   Using aseptic technique a 46F hematuria coude catheter placed with immediate efflux of 1343mL non-foul yellow urine. No hemturia / clots noted in urine. Small urethral blood immediately tamponaded. Irrigation port plugged.    Assessment/Plan:  1 - Massive Prostate With Recurrent Urinary Retention - 46F foley placed as per above. Rec start tamsulosin + finasteride and continue indefinitely. This is best medical therapy to get him and keep him retention free. Continue current foley until scheduled FU at our office on 09/19/18 at which point repeat trial of void.   2 - Chronic Kidney Disease - Cr at baseline from medical renal disease.   3 - Elevated PSA - Agree with recheck in elective setting when catheter / retention free. He would not be candidate for aggressive / curative therapy even if cancer present.   Alexis Frock 09/03/2018, 4:38 PM

## 2018-09-03 NOTE — ED Notes (Signed)
`  Attempted IV insertion, bloody drainage returned noted in foley catheter and around foley catheter.  Per PA, RN attempted coude, unsuccessful.  PA made aware.

## 2018-09-03 NOTE — Discharge Instructions (Addendum)
Start taking the finasteride and tamsulosin once daily.  The tamsulosin will help you urinate more immediately and the finasteride will be helpful with your enlarged prostate long-term.  The tamsulosin may drop your blood pressure, so be careful getting up from a laying position as you may feel lightheaded.  It may be better for you to take this medication at night if you find that you are significantly lightheaded during the day while taking the medicine.  Follow-up with urology as scheduled for reevaluation.  Return to the emergency department if any concerning signs or symptoms develop such as fevers, severe abdominal pain, no urine output through the Foley catheter, or persistent vomiting.

## 2018-09-03 NOTE — ED Provider Notes (Signed)
[name] Provider Note   CSN: [id number] Arrival date & time: 09/03/18  1459     History   Chief Complaint Chief Complaint  Patient presents with  . Urinary Retention    HPI [name] is a 78 y.o. male with history of CKD, CHF, diabetes, hypertension, hyperlipidemia, renal lesion, alcohol abuse presents for evaluation of acute onset, persistent urinary symptoms.  He reports he has not been able to urinate much since yesterday but "sometimes I dribble a few drops ".  He also notes dysuria and urgency and some pressure in the lower abdomen.  Denies nausea, vomiting, fevers, chest pain, shortness of breath, diarrhea, or constipation.  He reports that he frequently experiences bouts of urinary retention which at times requires placement of a Foley catheter in the ED.  He recently followed up with urology and has an appointment to see them later this month as well.  He states that he is prescribed a pill to help with his urinary retention but does not remember the name of it and did not take it today.  He also did not take his other home medicines including his hypertension medications.  The history is provided by the patient.    Past Medical History:  Diagnosis Date  . Acute cholecystitis 11/23/2017  . Acute respiratory failure with hypoxia ([place])   . AKI (acute kidney injury) ([place])   . Alcohol abuse 02/26/2015  . Chronic combined systolic and diastolic CHF (congestive heart failure) ([place])   . CKD (chronic kidney disease), stage III ([place])   . Demand ischemia ([place])    a. minimally elevated troponin peak 0.11 in 2016, felt demand ischemia. Cath offered but pt wished to have done back in [place].  . Diabetes mellitus without complication ([place])   . Elevated troponin 02/26/2015  . Essential hypertension   . Hyperlipidemia   . Hypertension   . Renal lesion 11/23/2017  . Solitary kidney     Patient Active Problem List   Diagnosis Date Noted  .  Anemia of chronic disease 03/08/2018  . Acute cholecystitis 11/23/2017  . Renal lesion 11/23/2017  . Alcohol abuse 02/26/2015  . Hyperlipidemia   . Diabetes mellitus without complication ([place])   . CKD (chronic kidney disease), stage III ([place])   . Chronic combined systolic and diastolic heart failure ([place])   . Essential hypertension     Past Surgical History:  Procedure Laterality Date  . IR CHOLANGIOGRAM EXISTING TUBE  12/27/2017  . IR PERC CHOLECYSTOSTOMY  11/25/2017  . IR RADIOLOGIST EVAL & MGMT  02/02/2018        Home Medications    Prior to Admission medications   Medication Sig Start Date End Date Taking? Authorizing Provider  acetaminophen (TYLENOL) 500 MG tablet Take 1,000 mg by mouth every 6 (six) hours as needed for moderate pain or headache.   Yes [provider]  aspirin EC 81 MG tablet Take 81 mg by mouth daily.   Yes [provider]  carvedilol (COREG) 12.5 MG tablet Take 1 tablet (12.5 mg total) by mouth 2 (two) times daily. 03/08/18 09/03/18 Yes Daune Perch, NP  cephALEXin (KEFLEX) 500 MG capsule Take 1,000 mg by mouth 2 (two) times daily.   Yes [provider]  dextromethorphan-guaiFENesin (MUCINEX DM) 30-600 MG 12hr tablet Take 1 tablet by mouth daily as needed for cough.   Yes [provider]  furosemide (LASIX) 20 MG tablet Take 1 tablet (20 mg total) by mouth daily.  Patient taking differently: Take 10-20 mg by mouth daily.  08/16/18  Yes Maudie Flakes, MD  Multiple Vitamin (MULTIVITAMIN) tablet Take 1 tablet by mouth daily.   Yes [provider]  finasteride (PROSCAR) 5 MG tablet Take 1 tablet (5 mg total) by mouth daily. 09/03/18   Fawze, Mina A, PA-C  tamsulosin (FLOMAX) 0.4 MG CAPS capsule Take 1 capsule (0.4 mg total) by mouth daily. 09/03/18   Renita Papa, PA-C    Family History Family History  Problem Relation Age of Onset  . Other Mother        died in her sleep at 69  . CAD Neg Hx        neg hx premature  CAD    Social History Social History   Tobacco Use  . Smoking status: Former Smoker    Types: Cigarettes    Last attempt to quit: 02/26/2008    Years since quitting: 10.5  . Smokeless tobacco: Never Used  Substance Use Topics  . Alcohol use: Yes    Comment: occ  . Drug use: No     Allergies   Patient has no known allergies.   Review of Systems Review of Systems  Constitutional: Negative for chills and fever.  Respiratory: Negative for shortness of breath.   Cardiovascular: Negative for chest pain.  Gastrointestinal: Negative for constipation, diarrhea, nausea and vomiting.  Genitourinary: Positive for difficulty urinating, dysuria and urgency. Negative for discharge, penile pain and scrotal swelling.  All other systems reviewed and are negative.    Physical Exam Updated Vital Signs BP (!) 164/80   Pulse 88   Temp (!) 97.3 F (36.3 C) (Oral)   Resp (!) 22   SpO2 96%   Physical Exam Vitals signs and nursing note reviewed.  Constitutional:      General: He is not in acute distress.    Appearance: He is well-developed.     Comments: Appears uncomfortable  HENT:     Head: Normocephalic and atraumatic.  Eyes:     General:        Right eye: No discharge.        Left eye: No discharge.     Conjunctiva/sclera: Conjunctivae normal.  Neck:     Vascular: No JVD.     Trachea: No tracheal deviation.  Cardiovascular:     Rate and Rhythm: Normal rate.  Pulmonary:     Effort: Pulmonary effort is normal.     Breath sounds: Normal breath sounds.  Abdominal:     General: Bowel sounds are normal. There is no distension.     Tenderness: There is no abdominal tenderness. There is no right CVA tenderness, left CVA tenderness, guarding or rebound.     Hernia: No hernia is present.  Skin:    General: Skin is warm and dry.     Findings: No erythema.  Neurological:     Mental Status: He is alert.  Psychiatric:        Behavior: Behavior normal.      ED Treatments /  Results  Labs (all labs ordered are listed, but only abnormal results are displayed) Labs Reviewed  URINALYSIS, ROUTINE W REFLEX MICROSCOPIC - Abnormal; Notable for the following components:      Result Value   Hgb urine dipstick LARGE (*)    Protein, ur 100 (*)    RBC / HPF >50 (*)    Bacteria, UA RARE (*)    All other components within normal limits  CBC  WITH DIFFERENTIAL/PLATELET - Abnormal; Notable for the following components:   RBC 3.82 (*)    Hemoglobin 11.4 (*)    HCT 36.6 (*)    Neutro Abs 7.9 (*)    All other components within normal limits  BASIC METABOLIC PANEL - Abnormal; Notable for the following components:   CO2 21 (*)    Glucose, Bld 136 (*)    Creatinine, Ser 1.56 (*)    Calcium 8.8 (*)    GFR calc non Af Amer 43 (*)    GFR calc Af Amer 50 (*)    All other components within normal limits    EKG None  Radiology No results found.  Procedures Procedures (including critical care time)  Medications Ordered in ED Medications  morphine 4 MG/ML injection 4 mg (4 mg Intravenous Given 09/03/18 1600)     Initial Impression / Assessment and Plan / ED Course  I have reviewed the triage vital signs and the nursing notes.  Pertinent labs & imaging results that were available during my care of the patient were reviewed by me and considered in my medical decision making (see chart for details).    Patient with history of BPH presenting for evaluation of urinary retention.  Reports he has not been able to urinate since yesterday.  He is afebrile, initially tachycardic and hypertensive with improvement on reevaluation.  He appears uncomfortable but nontoxic in appearance.  Examination of the abdomen is benign.  Bladder scan showed less than 200 cc of urine.  RN attempted Foley catheter insertion twice unsuccessfully with concern for blood clots around the Foley catheter.  Spoke with Dr. Tresa Moore with urology who reports that he will come to see the patient and further  evaluate him.  Labs reviewed by me show renal function around patient's baseline.  Mild anemia noted.  No metabolic derangements.  Dr. Tresa Moore successfully inserted Foley catheter with approximately 1400 cc urine output which was initially bloody but then became clear.  Suspect blood from traumatic Foley catheter insertions.  UA does not suggest UTI.  No evidence of acute surgical abdominal pathology.  Patient does not appear to be septic or ill at this time.  He reports he is feeling much better after placement of Foley catheter.  Patient stable for discharge home with follow-up with urology for reevaluation of symptoms.  Dr. Tresa Moore recommends discharge with finasteride and Flomax.  Discussed tricked ED return precautions.  Patient and neighbor verbalized understanding of and agreement with plan and patient stable for discharge home at this time.  Final Clinical Impressions(s) / ED Diagnoses   Final diagnoses:  Urinary retention    ED Discharge Orders         Ordered    finasteride (PROSCAR) 5 MG tablet  Daily     09/03/18 1711    tamsulosin (FLOMAX) 0.4 MG CAPS capsule  Daily     09/03/18 1711           Renita Papa, PA-C 09/03/18 1807    Dorie Rank, MD 09/03/18 2149

## 2018-09-03 NOTE — ED Triage Notes (Signed)
Pt. Stated, he has not urinated since yesterday

## 2018-09-03 NOTE — ED Notes (Signed)
Bladder scan completed result  >200

## 2018-09-13 ENCOUNTER — Emergency Department (HOSPITAL_COMMUNITY)
Admission: EM | Admit: 2018-09-13 | Discharge: 2018-09-13 | Disposition: A | Discharge status: Home / Self Care | Payer: Medicare Other | Attending: Emergency Medicine | Admitting: Emergency Medicine

## 2018-09-13 ENCOUNTER — Encounter (HOSPITAL_COMMUNITY): Payer: Self-pay | Admitting: *Deleted

## 2018-09-13 ENCOUNTER — Other Ambulatory Visit: Payer: Self-pay

## 2018-09-13 DIAGNOSIS — R338 Other retention of urine: Secondary | ICD-10-CM | POA: Diagnosis present

## 2018-09-13 DIAGNOSIS — I130 Hypertensive heart and chronic kidney disease with heart failure and stage 1 through stage 4 chronic kidney disease, or unspecified chronic kidney disease: Secondary | ICD-10-CM | POA: Insufficient documentation

## 2018-09-13 DIAGNOSIS — Z87891 Personal history of nicotine dependence: Secondary | ICD-10-CM | POA: Diagnosis not present

## 2018-09-13 DIAGNOSIS — R1033 Periumbilical pain: Secondary | ICD-10-CM | POA: Diagnosis not present

## 2018-09-13 DIAGNOSIS — N401 Enlarged prostate with lower urinary tract symptoms: Secondary | ICD-10-CM | POA: Diagnosis not present

## 2018-09-13 DIAGNOSIS — Z79899 Other long term (current) drug therapy: Secondary | ICD-10-CM | POA: Insufficient documentation

## 2018-09-13 DIAGNOSIS — E1122 Type 2 diabetes mellitus with diabetic chronic kidney disease: Secondary | ICD-10-CM | POA: Diagnosis not present

## 2018-09-13 DIAGNOSIS — N183 Chronic kidney disease, stage 3 (moderate): Secondary | ICD-10-CM | POA: Insufficient documentation

## 2018-09-13 DIAGNOSIS — I5042 Chronic combined systolic (congestive) and diastolic (congestive) heart failure: Secondary | ICD-10-CM | POA: Insufficient documentation

## 2018-09-13 DIAGNOSIS — R339 Retention of urine, unspecified: Secondary | ICD-10-CM

## 2018-09-13 LAB — URINALYSIS, ROUTINE W REFLEX MICROSCOPIC
Bilirubin Urine: NEGATIVE
Glucose, UA: NEGATIVE mg/dL
Ketones, ur: NEGATIVE mg/dL
Leukocytes, UA: NEGATIVE
Nitrite: NEGATIVE
Protein, ur: 30 mg/dL — AB
Specific Gravity, Urine: 1.008 (ref 1.005–1.030)
pH: 5 (ref 5.0–8.0)

## 2018-09-13 LAB — I-STAT CHEM 8, ED
BUN: 21 mg/dL (ref 8–23)
Calcium, Ion: 1.14 mmol/L — ABNORMAL LOW (ref 1.15–1.40)
Chloride: 105 mmol/L (ref 98–111)
Creatinine, Ser: 1.5 mg/dL — ABNORMAL HIGH (ref 0.61–1.24)
Glucose, Bld: 141 mg/dL — ABNORMAL HIGH (ref 70–99)
HCT: 37 % — ABNORMAL LOW (ref 39.0–52.0)
Hemoglobin: 12.6 g/dL — ABNORMAL LOW (ref 13.0–17.0)
Potassium: 3.5 mmol/L (ref 3.5–5.1)
Sodium: 141 mmol/L (ref 135–145)
TCO2: 25 mmol/L (ref 22–32)

## 2018-09-13 NOTE — ED Triage Notes (Signed)
C/o urinary retention  States he was here on Sunday with the same and had foley cath placed, states cath was removed 2 days ago  And he has had difficulty urinating . States last time urinated was 2300.

## 2018-09-13 NOTE — ED Provider Notes (Signed)
[facility] EMERGENCY DEPARTMENT Provider Note  CSN: [id number] Arrival date & time: 09/13/18 0518  Chief Complaint(s) Urinary Retention  HPI [name] is a 78 y.o. male with a history of BPH who presents to the emergency department with approximately 8 hours of inability to void.  Patient has had a recent urinary retention due to the BPH requiring Foley catheter placement.  He was seen last week for this and had the catheter removed 2 days ago.  Reports that his last void was last night.  He is endorsing suprapubic and scrotal discomfort similar to prior presentations.  Denies any nausea or vomiting.  No fevers or chills.  Denies any other physical complaints.  HPI  Past Medical History Past Medical History:  Diagnosis Date  . Acute cholecystitis 11/23/2017  . Acute respiratory failure with hypoxia ([place])   . AKI (acute kidney injury) ([place])   . Alcohol abuse 02/26/2015  . Chronic combined systolic and diastolic CHF (congestive heart failure) ([place])   . CKD (chronic kidney disease), stage III ([place])   . Demand ischemia ([place])    a. minimally elevated troponin peak 0.11 in 2016, felt demand ischemia. Cath offered but pt wished to have done back in [place].  . Diabetes mellitus without complication ([place])   . Elevated troponin 02/26/2015  . Essential hypertension   . Hyperlipidemia   . Hypertension   . Renal lesion 11/23/2017  . Solitary kidney    Patient Active Problem List   Diagnosis Date Noted  . Anemia of chronic disease 03/08/2018  . Acute cholecystitis 11/23/2017  . Renal lesion 11/23/2017  . Alcohol abuse 02/26/2015  . Hyperlipidemia   . Diabetes mellitus without complication ([place])   . CKD (chronic kidney disease), stage III ([place])   . Chronic combined systolic and diastolic heart failure ([place])   . Essential hypertension    Home Medication(s) Prior to Admission medications   Medication Sig Start Date End Date Taking? Authorizing Provider  acetaminophen  (TYLENOL) 500 MG tablet Take 1,000 mg by mouth every 6 (six) hours as needed for moderate pain or headache.   Yes [provider]  aspirin EC 81 MG tablet Take 81 mg by mouth daily.   Yes [provider]  carvedilol (COREG) 12.5 MG tablet Take 1 tablet (12.5 mg total) by mouth 2 (two) times daily. Patient taking differently: Take 6.25 mg by mouth 2 (two) times daily.  03/08/18 09/13/18 Yes Daune Perch, NP  dextromethorphan-guaiFENesin [facility] DM) 30-600 MG 12hr tablet Take 1 tablet by mouth daily as needed for cough.   Yes [provider]  finasteride (PROSCAR) 5 MG tablet Take 1 tablet (5 mg total) by mouth daily. 09/03/18  Yes Fawze, Mina A, PA-C  furosemide (LASIX) 20 MG tablet Take 1 tablet (20 mg total) by mouth daily. 08/16/18  Yes Maudie Flakes, MD  Multiple Vitamin (MULTIVITAMIN) tablet Take 1 tablet by mouth daily.   Yes [provider]  tamsulosin (FLOMAX) 0.4 MG CAPS capsule Take 1 capsule (0.4 mg total) by mouth daily. 09/03/18  Yes Fawze, Mina A, PA-C  Past Surgical History Past Surgical History:  Procedure Laterality Date  . IR CHOLANGIOGRAM EXISTING TUBE  12/27/2017  . IR PERC CHOLECYSTOSTOMY  11/25/2017  . IR RADIOLOGIST EVAL & MGMT  02/02/2018   Family History Family History  Problem Relation Age of Onset  . Other Mother        died in her sleep at 27  . CAD Neg Hx        neg hx premature CAD    Social History Social History   Tobacco Use  . Smoking status: Former Smoker    Types: Cigarettes    Last attempt to quit: 02/26/2008    Years since quitting: 10.5  . Smokeless tobacco: Never Used  Substance Use Topics  . Alcohol use: Yes    Comment: occ  . Drug use: No   Allergies Patient has no known allergies.  Review of Systems Review of Systems All other systems are reviewed and are negative for  acute change except as noted in the HPI  Physical Exam Vital Signs  I have reviewed the triage vital signs BP (!) 168/82 (BP Location: Right Arm)   Pulse (!) 110   Temp 97.9 F (36.6 C) (Oral)   Resp 20   Ht 5\' 11"  (1.803 m)   Wt 92 kg   SpO2 98%   BMI 28.29 kg/m   Physical Exam Vitals signs reviewed.  Constitutional:      General: He is not in acute distress.    Appearance: He is well-developed. He is not diaphoretic.  HENT:     Head: Normocephalic and atraumatic.     Jaw: No trismus.     Right Ear: External ear normal.     Left Ear: External ear normal.     Nose: Nose normal.  Eyes:     General: No scleral icterus.    Conjunctiva/sclera: Conjunctivae normal.  Neck:     Musculoskeletal: Normal range of motion.     Trachea: Phonation normal.  Cardiovascular:     Rate and Rhythm: Normal rate and regular rhythm.  Pulmonary:     Effort: Pulmonary effort is normal. No respiratory distress.     Breath sounds: No stridor.  Abdominal:     General: There is distension.     Tenderness: There is abdominal tenderness (discomfort) in the suprapubic area. There is no guarding or rebound.  Musculoskeletal: Normal range of motion.  Neurological:     Mental Status: He is alert and oriented to person, place, and time.  Psychiatric:        Behavior: Behavior normal.     ED Results and Treatments Labs (all labs ordered are listed, but only abnormal results are displayed) Labs Reviewed  URINALYSIS, ROUTINE W REFLEX MICROSCOPIC - Abnormal; Notable for the following components:      Result Value   Color, Urine STRAW (*)    Hgb urine dipstick MODERATE (*)    Protein, ur 30 (*)    Bacteria, UA RARE (*)    All other components within normal limits  I-STAT CHEM 8, ED - Abnormal; Notable for the following components:   Creatinine, Ser 1.50 (*)    Glucose, Bld 141 (*)    Calcium, Ion 1.14 (*)    Hemoglobin 12.6 (*)    HCT 37.0 (*)    All other components within normal limits  EKG  EKG Interpretation  Date/Time:    Ventricular Rate:    PR Interval:    QRS Duration:   QT Interval:    QTC Calculation:   R Axis:     Text Interpretation:        Radiology No results found. Pertinent labs & imaging results that were available during my care of the patient were reviewed by me and considered in my medical decision making (see chart for details).  Medications Ordered in ED Medications - No data to display                                                                                                                                  Procedures BLADDER CATHETERIZATION Date/Time: 09/13/2018 7:35 AM Performed by: Fatima Blank, MD Authorized by: Fatima Blank, MD   Consent:    Consent obtained:  Verbal   Consent given by:  Patient   Risks discussed:  False passage, urethral injury and pain   Alternatives discussed:  No treatment Pre-procedure details:    Procedure purpose:  Therapeutic   Preparation: Patient was prepped and draped in usual sterile fashion   Procedure details:    Provider performed due to:  Altered anatomy and complicated insertion (urinary retention)   Catheter insertion:  Indwelling   Catheter type:  Coude   Catheter size:  22 Fr   Number of attempts:  1   Urine characteristics:  Clear Post-procedure details:    Patient tolerance of procedure:  Tolerated well, no immediate complications    (including critical care time)  Medical Decision Making / ED Course I have reviewed the nursing notes for this encounter and the patient's prior records (if available in EHR or on provided paperwork).    Patient presents with recurrent urinary retention with suprapubic fullness and discomfort.  Bladder scan revealed greater than 600 cc in the bladder.  67 [place] coud catheter was placed by myself retrieving clear  urine.  Renal function intact. UA without evidence of infection. Urology follow up.  The patient appears reasonably screened and/or stabilized for discharge and I doubt any other medical condition or other [facility] requiring further screening, evaluation, or treatment in the ED at this time prior to discharge.  The patient is safe for discharge with strict return precautions.   Final Clinical Impression(s) / ED Diagnoses Final diagnoses:  Urinary retention   Disposition: Discharge  Condition: Good  I have discussed the results, Dx and Tx plan with the patient who expressed understanding and agree(s) with the plan. Discharge instructions discussed at great length. The patient was given strict return precautions who verbalized understanding of the instructions. No further questions at time of discharge.    ED Discharge Orders    None       Follow Up: Alexis Frock, MD [place] [id number] [phone]  Schedule an appointment as soon as possible for a visit  For close follow up to assess for recurrent retention      This chart was dictated using voice recognition software.  Despite best efforts to proofread,  errors can occur which can change the documentation meaning.   Fatima Blank, MD 09/13/18 [phone]

## 2019-01-18 ENCOUNTER — Emergency Department (HOSPITAL_COMMUNITY)
Admission: EM | Admit: 2019-01-18 | Discharge: 2019-01-18 | Disposition: A | Discharge status: Home / Self Care | Payer: Medicare Other | Attending: Emergency Medicine | Admitting: Emergency Medicine

## 2019-01-18 ENCOUNTER — Other Ambulatory Visit: Payer: Self-pay

## 2019-01-18 DIAGNOSIS — Z79899 Other long term (current) drug therapy: Secondary | ICD-10-CM | POA: Diagnosis not present

## 2019-01-18 DIAGNOSIS — Z7982 Long term (current) use of aspirin: Secondary | ICD-10-CM | POA: Insufficient documentation

## 2019-01-18 DIAGNOSIS — N183 Chronic kidney disease, stage 3 (moderate): Secondary | ICD-10-CM | POA: Diagnosis not present

## 2019-01-18 DIAGNOSIS — I1310 Hypertensive heart and chronic kidney disease without heart failure, with stage 1 through stage 4 chronic kidney disease, or unspecified chronic kidney disease: Secondary | ICD-10-CM | POA: Insufficient documentation

## 2019-01-18 DIAGNOSIS — I5042 Chronic combined systolic (congestive) and diastolic (congestive) heart failure: Secondary | ICD-10-CM | POA: Diagnosis not present

## 2019-01-18 DIAGNOSIS — E1122 Type 2 diabetes mellitus with diabetic chronic kidney disease: Secondary | ICD-10-CM | POA: Diagnosis not present

## 2019-01-18 DIAGNOSIS — N3000 Acute cystitis without hematuria: Secondary | ICD-10-CM

## 2019-01-18 DIAGNOSIS — Z87891 Personal history of nicotine dependence: Secondary | ICD-10-CM | POA: Insufficient documentation

## 2019-01-18 DIAGNOSIS — R339 Retention of urine, unspecified: Secondary | ICD-10-CM | POA: Diagnosis present

## 2019-01-18 LAB — URINALYSIS, ROUTINE W REFLEX MICROSCOPIC
Bilirubin Urine: NEGATIVE
Glucose, UA: NEGATIVE mg/dL
Ketones, ur: NEGATIVE mg/dL
Nitrite: NEGATIVE
Protein, ur: 100 mg/dL — AB
Specific Gravity, Urine: 1.014 (ref 1.005–1.030)
WBC, UA: >50 WBC/hpf — ABNORMAL HIGH (ref 0–5)
pH: 5 (ref 5.0–8.0)

## 2019-01-18 LAB — URINE CULTURE: Culture: <10000 — AB

## 2019-01-18 MED ORDER — LIDOCAINE HCL (PF) 1 % IJ SOLN
5.0000 mL | Freq: Once | INTRAMUSCULAR | Status: AC
  Administered 2019-01-18: 04:00:00 5 mL via INTRADERMAL
  Filled 2019-01-18: qty 5

## 2019-01-18 MED ORDER — CEFTRIAXONE SODIUM 1 G IJ SOLR
1.0000 g | Freq: Once | INTRAMUSCULAR | Status: AC
  Administered 2019-01-18: 1 g via INTRAMUSCULAR
  Filled 2019-01-18: qty 10

## 2019-01-18 MED ORDER — CEPHALEXIN 500 MG PO CAPS: 500.0000 mg | ORAL_CAPSULE | Freq: Three times a day (TID) | ORAL | 0 refills | Status: DC

## 2019-01-18 NOTE — ED Triage Notes (Signed)
Pt had urinary catheter removed yesterday. Since then he has been unable to void. Told to come to ED if problem occurred.

## 2019-01-18 NOTE — ED Provider Notes (Signed)
TIME SEEN: 2:04 AM  CHIEF COMPLAINT: Urinary retention  HPI: Patient is a 78 year old male with history of hypertension, hyperlipidemia, CHF who presents to the emergency department with urinary retention.  He states he was last able to urinate normally sometime yesterday afternoon or evening.  Has known history of BPH and has had urinary retention several times in the past and has had to have a Foley catheter.  States Foley catheter was just removed 2 days ago.  He denies any fevers, cough, chest pain or worsening of his chronic shortness of breath.  States he has some abdominal discomfort.  No back pain, numbness, tingling or focal weakness.  No new medications.  States he is supposed to start Entresto but could not afford this medication as it was $325 at [employer].  He is being followed by [facility].  ROS: See HPI Constitutional: no fever  Eyes: no drainage  ENT: no runny nose   Cardiovascular:  no chest pain  Resp: no SOB  GI: no vomiting GU: no dysuria Integumentary: no rash  Allergy: no hives  Musculoskeletal: no leg swelling  Neurological: no slurred speech ROS otherwise negative  PAST MEDICAL HISTORY/PAST SURGICAL HISTORY:  Past Medical History:  Diagnosis Date  . Acute cholecystitis 11/23/2017  . Acute respiratory failure with hypoxia ([place])   . AKI (acute kidney injury) ([place])   . Alcohol abuse 02/26/2015  . Chronic combined systolic and diastolic CHF (congestive heart failure) ([place])   . CKD (chronic kidney disease), stage III ([place])   . Demand ischemia ([place])    a. minimally elevated troponin peak 0.11 in 2016, felt demand ischemia. Cath offered but pt wished to have done back in [place].  . Diabetes mellitus without complication ([place])   . Elevated troponin 02/26/2015  . Essential hypertension   . Hyperlipidemia   . Hypertension   . Renal lesion 11/23/2017  . Solitary kidney     MEDICATIONS:  Prior to Admission medications   Medication Sig Start Date End Date Taking?  Authorizing Provider  acetaminophen (TYLENOL) 500 MG tablet Take 1,000 mg by mouth every 6 (six) hours as needed for moderate pain or headache.    [provider]  aspirin EC 81 MG tablet Take 81 mg by mouth daily.    [provider]  carvedilol (COREG) 12.5 MG tablet Take 1 tablet (12.5 mg total) by mouth 2 (two) times daily. Patient taking differently: Take 6.25 mg by mouth 2 (two) times daily.  03/08/18 09/13/18  Daune Perch, NP  dextromethorphan-guaiFENesin [facility] DM) 30-600 MG 12hr tablet Take 1 tablet by mouth daily as needed for cough.    [provider]  finasteride (PROSCAR) 5 MG tablet Take 1 tablet (5 mg total) by mouth daily. 09/03/18   Fawze, Mina A, PA-C  furosemide (LASIX) 20 MG tablet Take 1 tablet (20 mg total) by mouth daily. 08/16/18   Maudie Flakes, MD  Multiple Vitamin (MULTIVITAMIN) tablet Take 1 tablet by mouth daily.    [provider]  tamsulosin (FLOMAX) 0.4 MG CAPS capsule Take 1 capsule (0.4 mg total) by mouth daily. 09/03/18   Rodell Perna A, PA-C    ALLERGIES:  No Known Allergies  SOCIAL HISTORY:  Social History   Tobacco Use  . Smoking status: Former Smoker    Types: Cigarettes    Last attempt to quit: 02/26/2008    Years since quitting: 10.9  . Smokeless tobacco: Never Used  Substance Use Topics  . Alcohol use: Yes  Comment: occ    FAMILY HISTORY: Family History  Problem Relation Age of Onset  . Other Mother        died in her sleep at 33  . CAD Neg Hx        neg hx premature CAD    EXAM: BP (!) 142/106   Pulse (!) 113   Temp 97.8 F (36.6 C) (Oral)   Resp 19   SpO2 100%  CONSTITUTIONAL: Alert and oriented and responds appropriately to questions.  Elderly, chronically ill-appearing, appears uncomfortable HEAD: Normocephalic EYES: Conjunctivae clear, pupils appear equal, EOMI ENT: normal nose; moist mucous membranes NECK: Supple, no meningismus, no nuchal rigidity, no LAD  CARD: Regular and  tachycardic; S1 and S2 appreciated; no murmurs, no clicks, no rubs, no gallops RESP: Normal chest excursion without splinting or tachypnea; breath sounds clear and equal bilaterally; no wheezes, no rhonchi, no rales, no hypoxia or respiratory distress, speaking full sentences ABD/GI: Normal bowel sounds; abdomen does not appear distended, abdomen is soft but is tender in the suprapubic area BACK:  The back appears normal and is non-tender to palpation, there is no CVA tenderness EXT: Normal ROM in all joints; non-tender to palpation; major deformity noted, nonpitting edema in bilateral lower extremities to the mid shin SKIN: Normal color for age and race; warm; no rash NEURO: Moves all extremities equally, reports normal sensation diffusely, normal speech PSYCH: The patient's mood and manner are appropriate. Grooming and personal hygiene are appropriate.  MEDICAL DECISION MAKING: Patient here with urinary retention.  History of the same requiring Foley catheter placement.  Appears uncomfortable here with hypertension and tachycardia.  Will perform bladder scan and discussed with patient if there is over 500 mL, will Place Foley catheter.  It appears during his last visit in January, ED physician placed a 22 French coud catheter.  ED PROGRESS: Patient has over 800 mL on bladder scan.  Will place Foley catheter.   Urine appears infected.  Urine culture pending.  No previous urine cultures have grown any significant bacteria.  Will give dose of IM Rocephin and discharged with Keflex.  We will have him follow-up with urology.  He reports feeling much better and his vital signs have improved now that he is comfortable.  Patient verbalized understanding and is comfortable with this plan for discharge home.   At this time, I do not feel there is any life-threatening condition present. I have reviewed and discussed all results (EKG, imaging, lab, urine as appropriate) and exam findings with patient/family.  I have reviewed nursing notes and appropriate previous records.  I feel the patient is safe to be discharged home without further emergent workup and can continue workup as an outpatient as needed. Discussed usual and customary return precautions. Patient/family verbalize understanding and are comfortable with this plan.  Outpatient follow-up has been provided as needed. All questions have been answered.     Ward, Delice Bison, DO 01/18/19 [phone]

## 2019-01-23 ENCOUNTER — Encounter: Payer: Self-pay | Admitting: Internal Medicine

## 2019-01-25 ENCOUNTER — Telehealth: Payer: Self-pay

## 2019-01-25 NOTE — Telephone Encounter (Signed)
Spoke with pt about appt on 01/26/19. Pt stated he does not have access to a smart device. Pt was advise to keep phonevisit and check vitlas prior to appt. Pt questions were address.

## 2019-01-26 ENCOUNTER — Telehealth (INDEPENDENT_AMBULATORY_CARE_PROVIDER_SITE_OTHER): Payer: Medicare Other | Admitting: Internal Medicine

## 2019-01-26 ENCOUNTER — Other Ambulatory Visit: Payer: Self-pay

## 2019-01-26 DIAGNOSIS — I472 Ventricular tachycardia: Secondary | ICD-10-CM | POA: Diagnosis not present

## 2019-01-26 DIAGNOSIS — I5042 Chronic combined systolic (congestive) and diastolic (congestive) heart failure: Secondary | ICD-10-CM | POA: Diagnosis not present

## 2019-01-26 DIAGNOSIS — F1010 Alcohol abuse, uncomplicated: Secondary | ICD-10-CM

## 2019-01-26 DIAGNOSIS — I4729 Other ventricular tachycardia: Secondary | ICD-10-CM

## 2019-01-26 NOTE — Progress Notes (Signed)
Electrophysiology TeleHealth Note   Due to national recommendations of social distancing due to [place] 19, Audio  telehealth visit is felt to be most appropriate for this patient at this time.  Verbal consent was obtained from the patient today.  The patient states that he does not have technology to allow for a virtual visit and requires phone call today.   Date:  01/26/2019   ID:  [name], DOB [birth year], MRN [id number]  Location: home  Provider location: [address], [place] Evaluation Performed: New patient consult  PCP:  [name], MD  Cardiologist:  Dr Claudie Leach (previously Dr Marlou Porch) Electrophysiologist:  None   Chief Complaint:  NSVT  History of Present Illness:    [name] is a 78 y.o. male who presents via audio conferencing for a telehealth visit today.   The patient is referred for new consultation regarding NSVT and low EF by Dr Claudie Leach.  The patient reports longstanding issues with alcohol, CRI, and systolic dysfunction.  Heavy ETOH is felt to be a likely cause for cardiomyopathy.  Cath has previously been declined.   The patient was previously treated with coreg and started entresto a week ago. The patient has been observed to have NSVT on event monitor. The patient reports that his primary concern is with BPH.  He has an indwelling foley.  The patient has had issues with marked edema.  This is his primary concern.  Today, he denies symptoms of palpitations, chest pain, shortness of breath, orthopnea, PND,claudication, dizziness, presyncope, syncope, bleeding, or neurologic sequela. The patient is tolerating medications without difficulties and is otherwise without complaint today.   he denies symptoms of cough, fevers, chills, or new SOB worrisome for COVID 19.   Past Medical History:  Diagnosis Date   Acute cholecystitis 11/23/2017   Acute respiratory failure with hypoxia ([facility])    Acute upper back pain    AKI (acute  kidney injury) ([place])    Alcohol abuse 02/26/2015   Anemia, deficiency    Back pain    LUMBOSACRAL   Bronchitis, complicated    Cardiomegaly    Chronic combined systolic and diastolic CHF (congestive heart failure) ([facility])    Chronic pain    HIGH RISK MED USE COMPREHENSIVE HIGH RISK   CKD (chronic kidney disease), stage III ([place])    Demand ischemia ([place])    a. minimally elevated troponin peak 0.11 in 2016, felt demand ischemia. Cath offered but pt wished to have done back in [place].   Diabetes mellitus without complication ([facility])    Edema, leg    Elevated troponin 02/26/2015   Essential hypertension    Fatigue    Heart abnormality    Hyperlipidemia    Hypertension    Neck pain, acute    PVD (peripheral vascular disease) ([facility])    Renal lesion 11/23/2017   SOB (shortness of breath) on exertion    Solitary kidney    Vitamin D deficiency     Past Surgical History:  Procedure Laterality Date   IR CHOLANGIOGRAM EXISTING TUBE  12/27/2017   IR PERC CHOLECYSTOSTOMY  11/25/2017   IR RADIOLOGIST EVAL & MGMT  02/02/2018    Current Outpatient Medications  Medication Sig Dispense Refill   acetaminophen (TYLENOL) 500 MG tablet Take 1,000 mg by mouth every 6 (six) hours as needed for moderate pain or headache.     aspirin EC 81 MG tablet Take 81 mg by mouth daily.     carvedilol (  COREG) 12.5 MG tablet Take 1 tablet (12.5 mg total) by mouth 2 (two) times daily. (Patient taking differently: Take 6.25 mg by mouth 2 (two) times daily. ) 180 tablet 3   cephALEXin (KEFLEX) 500 MG capsule Take 1 capsule (500 mg total) by mouth 3 (three) times daily. 21 capsule 0   dextromethorphan-guaiFENesin (MUCINEX DM) 30-600 MG 12hr tablet Take 1 tablet by mouth daily as needed for cough.     finasteride (PROSCAR) 5 MG tablet Take 1 tablet (5 mg total) by mouth daily. 30 tablet 0   furosemide (LASIX) 20 MG tablet Take 1 tablet (20 mg total) by mouth daily. 30 tablet 0   Multiple Vitamin  (MULTIVITAMIN) tablet Take 1 tablet by mouth daily.     tamsulosin (FLOMAX) 0.4 MG CAPS capsule Take 1 capsule (0.4 mg total) by mouth daily. 30 capsule 0   No current facility-administered medications for this visit.     Allergies:   Patient has no known allergies.   Social History:  The patient  reports that he quit smoking about 10 years ago. His smoking use included cigarettes. He has never used smokeless tobacco. He reports current alcohol use. He reports that he does not use drugs.   Family History:  The patient's  family history includes Diabetes Mellitus II in his sister; Heart disease in his sister; Hyperlipidemia in his sister; Hypertension in his sister; Other in his mother.    ROS:  Please see the history of present illness.   All other systems are personally reviewed and negative.    Exam:    Vital Signs:  There were no vitals taken for this visit.   Well sounding   Labs/Other Tests and Data Reviewed:    Recent Labs: 03/04/2018: ALT 26 08/16/2018: B Natriuretic Peptide 1,774.3 09/03/2018: Platelets 214 09/13/2018: BUN 21; Creatinine, Ser 1.50; Hemoglobin 12.6; Potassium 3.5; Sodium 141   Wt Readings from Last 3 Encounters:  09/13/18 202 lb 13.2 oz (92 kg)  08/16/18 203 lb (92.1 kg)  03/08/18 202 lb 6.4 oz (91.8 kg)     Other studies personally reviewed: Additional studies/ records that were reviewed today include: records from Dr Claudie Leach including recent echo and event monitor  Review of the above records today demonstrates: as above,  EF 20% with biventricular faiulre   ASSESSMENT & PLAN:    1.  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  He has not had prior cath.  Cardiac workup may be beneficial as well as ongoing medical management for CHF.  He is referred by Dr Claudie Leach for risk stratification of sudden death and consideration of ICD implantation.  At this  time, he meets MADIT II/ SCD-HeFT criteria for ICD implantation for primary prevention of sudden death.  I have had a thorough discussion with the patient reviewing options.  The patient  has had opportunities to ask questions and have them answered.  The patient is more concerned about indwelling foley and issues with urinary retention/ BPH, edema than he is his heart.  He prefers a conservative CV management plan.  I share concern about risks of infection with indwelling foley/ active bph issues at this time.  The patient and I have decided together through a shared decision making process to not proceed wityh ICD at this time.   The patient would prefer to continue to follow with Dr Claudie Leach and will contact my office if  further EP conversations are desired. I have advised avoidance of ETOH and compliance with medicine going forward.  2. COVID screen The patient does not have any symptoms that suggest any further testing/ screening at this time.  Social distancing reinforced today.  Follow-up:  With Dr Claudie Leach as scheduled I will see as needed  Current medicines are reviewed at length with the patient today.   The patient does not have concerns regarding his medicines.  The following changes were made today:  none  Labs/ tests ordered today include:  No orders of the defined types were placed in this encounter.  Patient Risk:  after full review of this patients clinical status, I feel that they are at high risk at this time.  Today, I have spent 20 minutes with the patient with telehealth technology discussing NSVT and CHF .    SignedThompson Grayer MD, [facility] 01/26/2019 11:51 AM   [facility] [address] [place] [address] [phone] (office) [phone] (fax)

## 2019-02-04 ENCOUNTER — Inpatient Hospital Stay (HOSPITAL_COMMUNITY): Payer: Medicare Other

## 2019-02-04 ENCOUNTER — Inpatient Hospital Stay (HOSPITAL_COMMUNITY)
Admission: EM | Admit: 2019-02-04 | Discharge: 2019-02-12 | DRG: 291 | Disposition: A | Discharge status: Home Health | Payer: Medicare Other | Attending: Internal Medicine | Admitting: Internal Medicine

## 2019-02-04 ENCOUNTER — Encounter (HOSPITAL_COMMUNITY): Payer: Self-pay | Admitting: Emergency Medicine

## 2019-02-04 ENCOUNTER — Emergency Department (HOSPITAL_COMMUNITY): Payer: Medicare Other

## 2019-02-04 ENCOUNTER — Other Ambulatory Visit: Payer: Self-pay

## 2019-02-04 DIAGNOSIS — E8770 Fluid overload, unspecified: Secondary | ICD-10-CM | POA: Diagnosis not present

## 2019-02-04 DIAGNOSIS — I444 Left anterior fascicular block: Secondary | ICD-10-CM | POA: Diagnosis not present

## 2019-02-04 DIAGNOSIS — N5089 Other specified disorders of the male genital organs: Secondary | ICD-10-CM | POA: Diagnosis not present

## 2019-02-04 DIAGNOSIS — Z7982 Long term (current) use of aspirin: Secondary | ICD-10-CM | POA: Diagnosis not present

## 2019-02-04 DIAGNOSIS — Z978 Presence of other specified devices: Secondary | ICD-10-CM

## 2019-02-04 DIAGNOSIS — R601 Generalized edema: Secondary | ICD-10-CM | POA: Diagnosis present

## 2019-02-04 DIAGNOSIS — I472 Ventricular tachycardia: Secondary | ICD-10-CM | POA: Diagnosis not present

## 2019-02-04 DIAGNOSIS — I130 Hypertensive heart and chronic kidney disease with heart failure and stage 1 through stage 4 chronic kidney disease, or unspecified chronic kidney disease: Secondary | ICD-10-CM | POA: Diagnosis present

## 2019-02-04 DIAGNOSIS — R338 Other retention of urine: Secondary | ICD-10-CM | POA: Diagnosis not present

## 2019-02-04 DIAGNOSIS — Z960 Presence of urogenital implants: Secondary | ICD-10-CM | POA: Diagnosis not present

## 2019-02-04 DIAGNOSIS — Z20828 Contact with and (suspected) exposure to other viral communicable diseases: Secondary | ICD-10-CM | POA: Diagnosis present

## 2019-02-04 DIAGNOSIS — Z79899 Other long term (current) drug therapy: Secondary | ICD-10-CM | POA: Diagnosis not present

## 2019-02-04 DIAGNOSIS — Z8249 Family history of ischemic heart disease and other diseases of the circulatory system: Secondary | ICD-10-CM

## 2019-02-04 DIAGNOSIS — I5023 Acute on chronic systolic (congestive) heart failure: Secondary | ICD-10-CM | POA: Diagnosis not present

## 2019-02-04 DIAGNOSIS — N183 Chronic kidney disease, stage 3 unspecified: Secondary | ICD-10-CM | POA: Diagnosis present

## 2019-02-04 DIAGNOSIS — Z8349 Family history of other endocrine, nutritional and metabolic diseases: Secondary | ICD-10-CM

## 2019-02-04 DIAGNOSIS — E1122 Type 2 diabetes mellitus with diabetic chronic kidney disease: Secondary | ICD-10-CM | POA: Diagnosis present

## 2019-02-04 DIAGNOSIS — N400 Enlarged prostate without lower urinary tract symptoms: Secondary | ICD-10-CM | POA: Diagnosis present

## 2019-02-04 DIAGNOSIS — I509 Heart failure, unspecified: Secondary | ICD-10-CM

## 2019-02-04 DIAGNOSIS — N401 Enlarged prostate with lower urinary tract symptoms: Secondary | ICD-10-CM | POA: Diagnosis not present

## 2019-02-04 DIAGNOSIS — E119 Type 2 diabetes mellitus without complications: Secondary | ICD-10-CM

## 2019-02-04 DIAGNOSIS — N179 Acute kidney failure, unspecified: Secondary | ICD-10-CM | POA: Diagnosis present

## 2019-02-04 DIAGNOSIS — E785 Hyperlipidemia, unspecified: Secondary | ICD-10-CM | POA: Diagnosis present

## 2019-02-04 DIAGNOSIS — E1151 Type 2 diabetes mellitus with diabetic peripheral angiopathy without gangrene: Secondary | ICD-10-CM | POA: Diagnosis present

## 2019-02-04 DIAGNOSIS — Z833 Family history of diabetes mellitus: Secondary | ICD-10-CM

## 2019-02-04 DIAGNOSIS — I5043 Acute on chronic combined systolic (congestive) and diastolic (congestive) heart failure: Secondary | ICD-10-CM | POA: Diagnosis present

## 2019-02-04 DIAGNOSIS — D649 Anemia, unspecified: Secondary | ICD-10-CM | POA: Diagnosis not present

## 2019-02-04 DIAGNOSIS — Z87891 Personal history of nicotine dependence: Secondary | ICD-10-CM | POA: Diagnosis not present

## 2019-02-04 LAB — CBC WITH DIFFERENTIAL/PLATELET
Abs Immature Granulocytes: 0.02 10*3/uL (ref 0.00–0.07)
Basophils Absolute: 0 10*3/uL (ref 0.0–0.1)
Basophils Relative: 1 %
Eosinophils Absolute: 0.1 10*3/uL (ref 0.0–0.5)
Eosinophils Relative: 2 %
HCT: 39.3 % (ref 39.0–52.0)
Hemoglobin: 12.3 g/dL — ABNORMAL LOW (ref 13.0–17.0)
Immature Granulocytes: 0 %
Lymphocytes Relative: 13 %
Lymphs Abs: 0.8 10*3/uL (ref 0.7–4.0)
MCH: 28.9 pg (ref 26.0–34.0)
MCHC: 31.3 g/dL (ref 30.0–36.0)
MCV: 92.5 fL (ref 80.0–100.0)
Monocytes Absolute: 0.3 10*3/uL (ref 0.1–1.0)
Monocytes Relative: 6 %
Neutro Abs: 4.5 10*3/uL (ref 1.7–7.7)
Neutrophils Relative %: 78 %
Platelets: 264 10*3/uL (ref 150–400)
RBC: 4.25 MIL/uL (ref 4.22–5.81)
RDW: 17.1 % — ABNORMAL HIGH (ref 11.5–15.5)
WBC: 5.8 10*3/uL (ref 4.0–10.5)
nRBC: 0 % (ref 0.0–0.2)

## 2019-02-04 LAB — BASIC METABOLIC PANEL
Anion gap: 12 (ref 5–15)
BUN: 42 mg/dL — ABNORMAL HIGH (ref 8–23)
CO2: 22 mmol/L (ref 22–32)
Calcium: 9.3 mg/dL (ref 8.9–10.3)
Chloride: 105 mmol/L (ref 98–111)
Creatinine, Ser: 2.38 mg/dL — ABNORMAL HIGH (ref 0.61–1.24)
GFR calc Af Amer: 30 mL/min — ABNORMAL LOW (ref >60)
GFR calc non Af Amer: 26 mL/min — ABNORMAL LOW (ref >60)
Glucose, Bld: 112 mg/dL — ABNORMAL HIGH (ref 70–99)
Potassium: 4.2 mmol/L (ref 3.5–5.1)
Sodium: 139 mmol/L (ref 135–145)

## 2019-02-04 LAB — URINALYSIS, ROUTINE W REFLEX MICROSCOPIC
Bacteria, UA: NONE SEEN
Bilirubin Urine: NEGATIVE
Glucose, UA: NEGATIVE mg/dL
Ketones, ur: NEGATIVE mg/dL
Leukocytes,Ua: NEGATIVE
Nitrite: NEGATIVE
Protein, ur: 100 mg/dL — AB
Specific Gravity, Urine: 1.013 (ref 1.005–1.030)
pH: 5 (ref 5.0–8.0)

## 2019-02-04 LAB — BRAIN NATRIURETIC PEPTIDE: B Natriuretic Peptide: >4500 pg/mL — ABNORMAL HIGH (ref 0.0–100.0)

## 2019-02-04 LAB — HEPATIC FUNCTION PANEL
ALT: 14 U/L (ref 0–44)
AST: 26 U/L (ref 15–41)
Albumin: 3.3 g/dL — ABNORMAL LOW (ref 3.5–5.0)
Alkaline Phosphatase: 53 U/L (ref 38–126)
Bilirubin, Direct: 0.9 mg/dL — ABNORMAL HIGH (ref 0.0–0.2)
Indirect Bilirubin: 1.7 mg/dL — ABNORMAL HIGH (ref 0.3–0.9)
Total Bilirubin: 2.6 mg/dL — ABNORMAL HIGH (ref 0.3–1.2)
Total Protein: 8.5 g/dL — ABNORMAL HIGH (ref 6.5–8.1)

## 2019-02-04 LAB — SARS CORONAVIRUS 2 BY RT PCR (HOSPITAL ORDER, PERFORMED IN ~~LOC~~ HOSPITAL LAB): SARS Coronavirus 2: NEGATIVE

## 2019-02-04 MED ORDER — FINASTERIDE 5 MG PO TABS
5.0000 mg | ORAL_TABLET | Freq: Every day | ORAL | Status: DC
  Administered 2019-02-04 – 2019-02-12 (×9): 5 mg via ORAL
  Filled 2019-02-04 (×10): qty 1

## 2019-02-04 MED ORDER — ACETAMINOPHEN 500 MG PO TABS
1000.0000 mg | ORAL_TABLET | Freq: Four times a day (QID) | ORAL | Status: DC | PRN
  Administered 2019-02-04 – 2019-02-09 (×7): 1000 mg via ORAL
  Filled 2019-02-04 (×7): qty 2

## 2019-02-04 MED ORDER — ASPIRIN EC 81 MG PO TBEC
81.0000 mg | DELAYED_RELEASE_TABLET | Freq: Every day | ORAL | Status: DC
  Administered 2019-02-04 – 2019-02-12 (×9): 81 mg via ORAL
  Filled 2019-02-04 (×9): qty 1

## 2019-02-04 MED ORDER — FUROSEMIDE 10 MG/ML IJ SOLN
40.0000 mg | Freq: Two times a day (BID) | INTRAMUSCULAR | Status: DC
  Administered 2019-02-04 – 2019-02-06 (×3): 40 mg via INTRAVENOUS
  Filled 2019-02-04 (×4): qty 4

## 2019-02-04 MED ORDER — TAMSULOSIN HCL 0.4 MG PO CAPS
0.4000 mg | ORAL_CAPSULE | Freq: Every day | ORAL | Status: DC
  Administered 2019-02-04 – 2019-02-12 (×9): 0.4 mg via ORAL
  Filled 2019-02-04 (×10): qty 1

## 2019-02-04 MED ORDER — ENOXAPARIN SODIUM 30 MG/0.3ML ~~LOC~~ SOLN
30.0000 mg | SUBCUTANEOUS | Status: DC
  Administered 2019-02-04 – 2019-02-11 (×8): 30 mg via SUBCUTANEOUS
  Filled 2019-02-04 (×8): qty 0.3

## 2019-02-04 MED ORDER — FUROSEMIDE 10 MG/ML IJ SOLN
40.0000 mg | Freq: Once | INTRAMUSCULAR | Status: AC
  Administered 2019-02-04: 12:00:00 40 mg via INTRAVENOUS
  Filled 2019-02-04: qty 4

## 2019-02-04 MED ORDER — CARVEDILOL 6.25 MG PO TABS
6.2500 mg | ORAL_TABLET | Freq: Two times a day (BID) | ORAL | Status: DC
  Administered 2019-02-04 – 2019-02-12 (×16): 6.25 mg via ORAL
  Filled 2019-02-04 (×18): qty 1

## 2019-02-04 NOTE — ED Triage Notes (Signed)
Pt here for eval of testicular or scrotal swelling, pt unable to specify which. PT has a catheter. Pt also reports for the past 2 weeks he has had increased shortness of breath with activity and that he can barely walk without becoming short of breath.

## 2019-02-04 NOTE — ED Notes (Signed)
ED Provider, Raul Del PA at bedside.

## 2019-02-04 NOTE — ED Provider Notes (Signed)
[facility] EMERGENCY DEPARTMENT Provider Note   CSN: [id number] Arrival date & time: 02/04/19  3151    History   Chief Complaint Chief Complaint  Patient presents with  . Shortness of Breath  . Testicle Pain    HPI [name] is a 78 y.o. male with history of CHF LVEF 20 to 25%, diabetes mellitus, hyperlipidemia, hypertension, peripheral vascular disease, CKD presents for evaluation of gradual onset, progressively worsening shortness of breath for the last 2 to 3 weeks.  He reports significant dyspnea on exertion and orthopnea.  Also has bilateral lower extremity edema but is unsure if this is worse than baseline.  Today he noticed that his scrotum and penis were swollen but is unsure how long this has been going on for.  He denies any testicular or penile pain, dysuria, hematuria, or change in urination.  He does have a chronic indwelling Foley catheter.  Denies pain with bowel movements.  No abdominal pain, nausea, vomiting, or diarrhea.  No fevers or chills.  Denies cough or chest pain.  Reports he has been compliant with his home medications, does not weigh himself so unsure if he has had any weight gain.  He does tell me that he has been compliant with his home medications, and states that his primary care physician at [facility] increased his Lasix to 40 mg daily 1 or 2 months ago.     The history is provided by the patient.    Past Medical History:  Diagnosis Date  . Acute cholecystitis 11/23/2017  . Acute respiratory failure with hypoxia ([place])   . Acute upper back pain   . AKI (acute kidney injury) ([place])   . Alcohol abuse 02/26/2015  . Anemia, deficiency   . Back pain    LUMBOSACRAL  . Bronchitis, complicated   . Cardiomegaly   . Chronic combined systolic and diastolic CHF (congestive heart failure) ([name])   . Chronic pain    HIGH RISK MED USE COMPREHENSIVE HIGH RISK  . CKD (chronic kidney disease), stage III ([place])   . Demand ischemia  ([place])    a. minimally elevated troponin peak 0.11 in 2016, felt demand ischemia. Cath offered but pt wished to have done back in [place].  . Diabetes mellitus without complication ([place])   . Edema, leg   . Elevated troponin 02/26/2015  . Essential hypertension   . Fatigue   . Heart abnormality   . Hyperlipidemia   . Hypertension   . Neck pain, acute   . PVD (peripheral vascular disease) ([place])   . Renal lesion 11/23/2017  . SOB (shortness of breath) on exertion   . Solitary kidney   . Vitamin D deficiency     Patient Active Problem List   Diagnosis Date Noted  . Anemia of chronic disease 03/08/2018  . Acute cholecystitis 11/23/2017  . Renal lesion 11/23/2017  . Alcohol abuse 02/26/2015  . Hyperlipidemia   . Diabetes mellitus without complication ([place])   . CKD (chronic kidney disease), stage III ([place])   . Chronic combined systolic and diastolic heart failure ([place])   . Essential hypertension     Past Surgical History:  Procedure Laterality Date  . IR CHOLANGIOGRAM EXISTING TUBE  12/27/2017  . IR PERC CHOLECYSTOSTOMY  11/25/2017  . IR RADIOLOGIST EVAL & MGMT  02/02/2018        Home Medications    Prior to Admission medications   Medication Sig Start Date End Date Taking? Authorizing Provider  acetaminophen (  TYLENOL) 500 MG tablet Take 1,000 mg by mouth every 6 (six) hours as needed for moderate pain or headache.   Yes [provider]  aspirin EC 81 MG tablet Take 81 mg by mouth daily.   Yes [provider]  carvedilol (COREG) 12.5 MG tablet Take 1 tablet (12.5 mg total) by mouth 2 (two) times daily. Patient taking differently: Take 6.25 mg by mouth 2 (two) times daily.  03/08/18 02/04/19 Yes Daune Perch, NP  dextromethorphan-guaiFENesin [facility] DM) 30-600 MG 12hr tablet Take 1 tablet by mouth daily as needed for cough.   Yes [provider]  finasteride (PROSCAR) 5 MG tablet Take 1 tablet (5 mg total) by mouth daily. 09/03/18  Yes Fawze, Mina A, PA-C   furosemide (LASIX) 20 MG tablet Take 1 tablet (20 mg total) by mouth daily. 08/16/18  Yes Maudie Flakes, MD  Multiple Vitamin (MULTIVITAMIN) tablet Take 1 tablet by mouth daily.   Yes [provider]  tamsulosin (FLOMAX) 0.4 MG CAPS capsule Take 1 capsule (0.4 mg total) by mouth daily. 09/03/18  Yes Fawze, Mina A, PA-C  ENTRESTO 24-26 MG Take 1 tablet by mouth 2 (two) times daily. 01/16/19   [provider]    Family History Family History  Problem Relation Age of Onset  . Other Mother        died in her sleep at 28  . Hypertension Sister   . Hyperlipidemia Sister   . Diabetes Mellitus II Sister   . Heart disease Sister   . CAD Neg Hx        neg hx premature CAD    Social History Social History   Tobacco Use  . Smoking status: Former Smoker    Types: Cigarettes    Last attempt to quit: 02/26/2008    Years since quitting: 10.9  . Smokeless tobacco: Never Used  Substance Use Topics  . Alcohol use: Yes    Comment: occ  . Drug use: No     Allergies   Patient has no known allergies.   Review of Systems Review of Systems  Constitutional: Negative for chills and fever.  Respiratory: Positive for shortness of breath. Negative for cough.   Cardiovascular: Positive for leg swelling. Negative for chest pain.  Gastrointestinal: Negative for abdominal pain, constipation, diarrhea, nausea and vomiting.  Genitourinary: Positive for penile swelling and scrotal swelling. Negative for dysuria, frequency, hematuria, penile pain, testicular pain and urgency.  All other systems reviewed and are negative.    Physical Exam Updated Vital Signs BP 112/70   Pulse (!) 101   Temp 97.7 F (36.5 C) (Oral)   Resp (!) 34   Ht 5\' 11"  (1.803 m)   Wt 90.7 kg   SpO2 99%   BMI 27.89 kg/m   Physical Exam Vitals signs and nursing note reviewed. Exam conducted with a chaperone present.  Constitutional:      General: He is not in acute distress.    Appearance: He is  well-developed.  HENT:     Head: Normocephalic and atraumatic.  Eyes:     General:        Right eye: No discharge.        Left eye: No discharge.     Conjunctiva/sclera: Conjunctivae normal.  Neck:     Vascular: No JVD.     Trachea: No tracheal deviation.  Cardiovascular:     Rate and Rhythm: Normal rate. Rhythm irregular.     Comments: 2+ pitting edema of the  bilateral lower extremities. Homan's sign absent bilaterally Pulmonary:     Effort: Pulmonary effort is normal.  Abdominal:     General: There is no distension.     Palpations: Abdomen is soft.  Genitourinary:    Penis: Swelling present.      Scrotum/Testes:        Right: Tenderness not present.        Left: Tenderness not present.     Comments: Examination performed in the presence of a chaperone; marked scrotal swelling and penile swelling with no skin breakdown. No tenderness to palpation. Foley catheter in place.  Musculoskeletal:     Right lower leg: He exhibits no tenderness. Edema present.     Left lower leg: He exhibits no tenderness. Edema present.  Skin:    General: Skin is warm and dry.     Findings: No erythema.  Neurological:     Mental Status: He is alert.  Psychiatric:        Behavior: Behavior normal.      ED Treatments / Results  Labs (all labs ordered are listed, but only abnormal results are displayed) Labs Reviewed  BASIC METABOLIC PANEL - Abnormal; Notable for the following components:      Result Value   Glucose, Bld 112 (*)    BUN 42 (*)    Creatinine, Ser 2.38 (*)    GFR calc non Af Amer 26 (*)    GFR calc Af Amer 30 (*)    All other components within normal limits  CBC WITH DIFFERENTIAL/PLATELET - Abnormal; Notable for the following components:   Hemoglobin 12.3 (*)    RDW 17.1 (*)    All other components within normal limits  BRAIN NATRIURETIC PEPTIDE - Abnormal; Notable for the following components:   B Natriuretic Peptide >4,500.0 (*)    All other components within normal  limits  URINALYSIS, ROUTINE W REFLEX MICROSCOPIC - Abnormal; Notable for the following components:   APPearance HAZY (*)    Hgb urine dipstick SMALL (*)    Protein, ur 100 (*)    All other components within normal limits  SARS CORONAVIRUS 2 (HOSPITAL ORDER, [facility] LAB)  URINE CULTURE  HEPATIC FUNCTION PANEL    EKG EKG Interpretation  Date/Time:  Sunday February 04 2019 09:03:14 EDT Ventricular Rate:  111 PR Interval:    QRS Duration: 99 QT Interval:  354 QTC Calculation: 481 R Axis:   -69 Text Interpretation:  Sinus tachycardia Atrial premature complex Probable left atrial enlargement Left anterior fascicular block Left ventricular hypertrophy Anterior infarct, old Nonspecific T abnormalities, lateral leads No significant change since last tracing Confirmed by Dorie Rank [phone]) on 02/04/2019 9:21:43 AM   Radiology Dg Chest Portable 1 View  Result Date: 02/04/2019 CLINICAL DATA:  Shortness of breath EXAM: PORTABLE CHEST 1 VIEW COMPARISON:  August 16, 2018 FINDINGS: There is cardiomegaly with pulmonary vascularity within normal limits. There is mild right base atelectasis. There is no edema or consolidation. No adenopathy. No bone lesions. IMPRESSION: Cardiomegaly. Mild right base atelectasis. No frank edema or consolidation. Electronically Signed   By: Lowella Grip III M.D.   On: 02/04/2019 09:44    Procedures Procedures (including critical care time)  Medications Ordered in ED Medications  furosemide (LASIX) injection 40 mg (40 mg Intravenous Given 02/04/19 1137)     Initial Impression / Assessment and Plan / ED Course  I have reviewed the triage vital signs and the nursing notes.  Pertinent labs & imaging  results that were available during my care of the patient were reviewed by me and considered in my medical decision making (see chart for details).        Patient presenting with what appears to be CHF exacerbation.  Increase in his Lasix by  his primary care doctor 1 to 2 months ago.  He is afebrile, intermittently tachypneic in the ED.  Nontoxic in appearance.  He has peripheral edema in the lower extremities as well as swelling to his scrotum and penis.  No tenderness or evidence of Fournier's gangrene.  EKG shows sinus tachycardia with nonspecific T wave abnormalities, no significant changes from last tracing.  He denies chest pain and I doubt ACS/MI.  Chest x-ray shows cardiomegaly with right base atelectasis, no consolidation.  Lab work reviewed by me shows no leukocytosis, mild anemia.  He does have an AKI with elevated BUN and creatinine compared to baseline.  Suspect this could be secondary to his increased Lasix dose.  BNP greater than 4500.  Significantly labored breathing noted on attempts to ambulate the patient in the room.  He is COVID negative.  Will require admission for further evaluation and management of CHF exacerbation.  Internal medicine teaching service to admit.  Final Clinical Impressions(s) / ED Diagnoses   Final diagnoses:  Acute on chronic combined systolic and diastolic congestive heart failure ([place])  Anasarca  AKI (acute kidney injury) [facility])    ED Discharge Orders    None       Debroah Baller 02/04/19 1204    Dorie Rank, MD 02/06/19 1502

## 2019-02-04 NOTE — Progress Notes (Signed)
Pat had 8 beat run of VT, Page MD to notify  for Tele orders.

## 2019-02-04 NOTE — ED Notes (Signed)
Patient had difficulty with ambulating in room. Breathing became labored with few steps from breath. Denied pain however facial grimacing observed. Tachycardia present.

## 2019-02-04 NOTE — ED Notes (Signed)
ED TO INPATIENT HANDOFF REPORT  ED Nurse Name and Phone #: [phone]  S Name/Age/Gender [name] 78 y.o. male Room/Bed: 018C/018C  Code Status   Code Status: Full Code  Home/SNF/Other Home Patient oriented to: self Is this baseline? Yes   Triage Complete: Triage complete  Chief Complaint sob  Triage Note Pt here for eval of testicular or scrotal swelling, pt unable to specify which. PT has a catheter. Pt also reports for the past 2 weeks he has had increased shortness of breath with activity and that he can barely walk without becoming short of breath.    Allergies No Known Allergies  Level of Care/Admitting Diagnosis ED Disposition    ED Disposition Condition [facility] Area: [place] [[id number]]  Level of Care: Med-Surg [16]  Covid Evaluation: Screening Protocol (No Symptoms)  Diagnosis: Acute on chronic congestive heart failure [facility]) [[id number]]  Admitting Physician: Oda Kilts [[id number]]  Attending Physician: Oda Kilts [[id number]]  Estimated length of stay: past midnight tomorrow  Certification:: I certify this patient will need inpatient services for at least 2 midnights  PT Class (Do Not Modify): Inpatient [101]  PT Acc Code (Do Not Modify): Private [1]       B Medical/Surgery History Past Medical History:  Diagnosis Date  . Acute cholecystitis 11/23/2017  . Acute respiratory failure with hypoxia ([place])   . Acute upper back pain   . AKI (acute kidney injury) ([place])   . Alcohol abuse 02/26/2015  . Anemia, deficiency   . Back pain    LUMBOSACRAL  . Bronchitis, complicated   . Cardiomegaly   . Chronic combined systolic and diastolic CHF (congestive heart failure) ([place])   . Chronic pain    HIGH RISK MED USE COMPREHENSIVE HIGH RISK  . CKD (chronic kidney disease), stage III ([place])   . Demand ischemia ([place])    a. minimally elevated troponin peak 0.11 in 2016, felt demand ischemia. Cath offered but pt  wished to have done back in [place].  . Diabetes mellitus without complication ([place])   . Edema, leg   . Elevated troponin 02/26/2015  . Essential hypertension   . Fatigue   . Heart abnormality   . Hyperlipidemia   . Hypertension   . Neck pain, acute   . PVD (peripheral vascular disease) ([place])   . Renal lesion 11/23/2017  . SOB (shortness of breath) on exertion   . Solitary kidney   . Vitamin D deficiency    Past Surgical History:  Procedure Laterality Date  . IR CHOLANGIOGRAM EXISTING TUBE  12/27/2017  . IR PERC CHOLECYSTOSTOMY  11/25/2017  . IR RADIOLOGIST EVAL & MGMT  02/02/2018     A IV Location/Drains/Wounds Patient Lines/Drains/Airways Status   Active Line/Drains/Airways    Name:   Placement date:   Placement time:   Site:   Days:   Peripheral IV 02/04/19 Right Antecubital   02/04/19    1029    Antecubital   less than 1   Biliary Tube Other (Comment) 10 Fr. RUQ   11/25/17    0900    RUQ   436   Urethral Catheter Tori Philipps RN [place] 22 Fr.   01/18/19    0237    Coude   17          Intake/Output Last 24 hours No intake or output data in the 24 hours ending 02/04/19 1251  Labs/Imaging Results for orders placed or performed during the hospital  encounter of 02/04/19 (from the past 48 hour(s))  Basic metabolic panel     Status: Abnormal   Collection Time: 02/04/19  9:06 AM  Result Value Ref Range   Sodium 139 135 - 145 mmol/L   Potassium 4.2 3.5 - 5.1 mmol/L   Chloride 105 98 - 111 mmol/L   CO2 22 22 - 32 mmol/L   Glucose, Bld 112 (H) 70 - 99 mg/dL   BUN 42 (H) 8 - 23 mg/dL   Creatinine, Ser 2.38 (H) 0.61 - 1.24 mg/dL   Calcium 9.3 8.9 - 10.3 mg/dL   GFR calc non Af Amer 26 (L) >60 mL/min   GFR calc Af Amer 30 (L) >60 mL/min   Anion gap 12 5 - 15    Comment: Performed at [facility], [place] [address] [place], [place] [address]  CBC with Differential     Status: Abnormal   Collection Time: 02/04/19  9:06 AM  Result Value Ref Range   WBC 5.8 4.0 - 10.5 K/uL    RBC 4.25 4.22 - 5.81 MIL/uL   Hemoglobin 12.3 (L) 13.0 - 17.0 g/dL   HCT 39.3 39.0 - 52.0 %   MCV 92.5 80.0 - 100.0 fL   MCH 28.9 26.0 - 34.0 pg   MCHC 31.3 30.0 - 36.0 g/dL   RDW 17.1 (H) 11.5 - 15.5 %   Platelets 264 150 - 400 K/uL   nRBC 0.0 0.0 - 0.2 %   Neutrophils Relative % 78 %   Neutro Abs 4.5 1.7 - 7.7 K/uL   Lymphocytes Relative 13 %   Lymphs Abs 0.8 0.7 - 4.0 K/uL   Monocytes Relative 6 %   Monocytes Absolute 0.3 0.1 - 1.0 K/uL   Eosinophils Relative 2 %   Eosinophils Absolute 0.1 0.0 - 0.5 K/uL   Basophils Relative 1 %   Basophils Absolute 0.0 0.0 - 0.1 K/uL   Immature Granulocytes 0 %   Abs Immature Granulocytes 0.02 0.00 - 0.07 K/uL    Comment: Performed at [facility] [address] [place], [place] [address]  Brain natriuretic peptide     Status: Abnormal   Collection Time: 02/04/19  9:06 AM  Result Value Ref Range   B Natriuretic Peptide >4,500.0 (H) 0.0 - 100.0 pg/mL    Comment: REPEATED TO VERIFY Performed at [facility] [address] [place], [place] [address]   SARS Coronavirus 2 (CEPHEID - Performed in [facility] hospital lab), Hosp Order     Status: None   Collection Time: 02/04/19  9:26 AM  Result Value Ref Range   SARS Coronavirus 2 NEGATIVE NEGATIVE    Comment: (NOTE) If result is NEGATIVE SARS-CoV-2 target nucleic acids are NOT DETECTED. The SARS-CoV-2 RNA is generally detectable in upper and lower  respiratory specimens during the acute phase of infection. The lowest  concentration of SARS-CoV-2 viral copies this assay can detect is 250  copies / mL. A negative result does not preclude SARS-CoV-2 infection  and should not be used as the sole basis for treatment or other  patient management decisions.  A negative result may occur with  improper specimen collection / handling, submission of specimen other  than nasopharyngeal swab, presence of viral mutation(s) within the  areas targeted by this assay, and inadequate number of  viral copies  (<250 copies / mL). A negative result must be combined with clinical  observations, patient history, and epidemiological information. If result is POSITIVE SARS-CoV-2  target nucleic acids are DETECTED. The SARS-CoV-2 RNA is generally detectable in upper and lower  respiratory specimens dur ing the acute phase of infection.  Positive  results are indicative of active infection with SARS-CoV-2.  Clinical  correlation with patient history and other diagnostic information is  necessary to determine patient infection status.  Positive results do  not rule out bacterial infection or co-infection with other viruses. If result is PRESUMPTIVE POSTIVE SARS-CoV-2 nucleic acids MAY BE PRESENT.   A presumptive positive result was obtained on the submitted specimen  and confirmed on repeat testing.  While 2019 novel coronavirus  (SARS-CoV-2) nucleic acids may be present in the submitted sample  additional confirmatory testing may be necessary for epidemiological  and / or clinical management purposes  to differentiate between  SARS-CoV-2 and other Sarbecovirus currently known to infect humans.  If clinically indicated additional testing with an alternate test  methodology [phone]) is advised. The SARS-CoV-2 RNA is generally  detectable in upper and lower respiratory sp ecimens during the acute  phase of infection. The expected result is Negative. Fact Sheet for Patients:  StrictlyIdeas.no Fact Sheet for Healthcare Providers: BankingDealers.co.za This test is not yet approved or cleared by the [place] FDA and has been authorized for detection and/or diagnosis of SARS-CoV-2 by FDA under an Emergency Use Authorization (EUA).  This EUA will remain in effect (meaning this test can be used) for the duration of the COVID-19 declaration under Section 564(b)(1) of the Act, 21 U.S.C. section 360bbb-3(b)(1), unless the authorization is  terminated or revoked sooner. Performed at [facility], [place] [address], [place], [place] [address]   Urinalysis, Routine w reflex microscopic     Status: Abnormal   Collection Time: 02/04/19 10:09 AM  Result Value Ref Range   Color, Urine YELLOW YELLOW   APPearance HAZY (A) CLEAR   Specific Gravity, Urine 1.013 1.005 - 1.030   pH 5.0 5.0 - 8.0   Glucose, UA NEGATIVE NEGATIVE mg/dL   Hgb urine dipstick SMALL (A) NEGATIVE   Bilirubin Urine NEGATIVE NEGATIVE   Ketones, ur NEGATIVE NEGATIVE mg/dL   Protein, ur 100 (A) NEGATIVE mg/dL   Nitrite NEGATIVE NEGATIVE   Leukocytes,Ua NEGATIVE NEGATIVE   RBC / HPF 0-5 0 - 5 RBC/hpf   WBC, UA 0-5 0 - 5 WBC/hpf   Bacteria, UA NONE SEEN NONE SEEN   Mucus PRESENT     Comment: Performed at [facility] [address] [place], [place] [address]   Dg Chest Portable 1 View  Result Date: 02/04/2019 CLINICAL DATA:  Shortness of breath EXAM: PORTABLE CHEST 1 VIEW COMPARISON:  August 16, 2018 FINDINGS: There is cardiomegaly with pulmonary vascularity within normal limits. There is mild right base atelectasis. There is no edema or consolidation. No adenopathy. No bone lesions. IMPRESSION: Cardiomegaly. Mild right base atelectasis. No frank edema or consolidation. Electronically Signed   By: Lowella Grip III M.D.   On: 02/04/2019 09:44    Pending Labs Unresulted Labs (From admission, onward)    Start     Ordered   02/05/19 8144  Basic metabolic panel  Tomorrow morning,   R     02/04/19 1216   02/04/19 1124  Hepatic function panel  Add-on,   STAT     02/04/19 1124   02/04/19 1010  Urine culture  ONCE - STAT,   STAT     02/04/19 1009          Vitals/Pain Today's Vitals  02/04/19 1133 02/04/19 1135 02/04/19 1141 02/04/19 1230  BP: 112/70 112/70  132/79  Pulse: 92 (!) 108 (!) 101 99  Resp: (!) 34     Temp:      TempSrc:      SpO2: 92% 100% 99% 97%  Weight:      Height:      PainSc: 0-No pain       Isolation  Precautions No active isolations  Medications Medications  enoxaparin (LOVENOX) injection 30 mg (has no administration in time range)  tamsulosin (FLOMAX) capsule 0.4 mg (has no administration in time range)  finasteride (PROSCAR) tablet 5 mg (has no administration in time range)  carvedilol (COREG) tablet 6.25 mg (has no administration in time range)  aspirin EC tablet 81 mg (has no administration in time range)  acetaminophen (TYLENOL) tablet 1,000 mg (has no administration in time range)  furosemide (LASIX) injection 40 mg (has no administration in time range)  furosemide (LASIX) injection 40 mg (40 mg Intravenous Given 02/04/19 1137)    Mobility walks with device Low fall risk   Focused Assessments Cardiac Assessment Handoff:    Lab Results  Component Value Date   TROPONINI 0.09 (H) 02/26/2015   No results found for: DDIMER Does the Patient currently have chest pain? No     R Recommendations: See Admitting Provider Note  Report given to: Melba, RN  Additional Notes:

## 2019-02-04 NOTE — H&P (Signed)
Date: 02/04/2019               Patient Name:  [name] MRN: [id number]  DOB: February 20, 1941 Age / Sex: 78 y.o., male   PCP: [name], MD         Medical Service: Internal Medicine Teaching Service         Attending Physician: Dr. Rebeca Alert Raynaldo Opitz, MD    First Contact: Dr. Gilberto Better Pager: [phone]  Second Contact: Dr. 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 He was in his usual state of health until about a week ago when he started noticing increased swelling in his lower extremities and penis with increasing discomfort. He states over the course of the week his symptoms gradually worsened and eventually came to the hospital for evaluation. He denies any changes to his medication or recent acute cardiac events. At baseline endorse 4 pillow orthopnea and able to ambulate without difficulty. He does mention that this week he has had diet with high sodium content, such as bologna sandwich, baked chicken, pork chops and steak. Denies any fevers, chills, sick contact, nausea, vomiting, diarrhea, dysuria, urgency, abdominal pain. He mentions that he had prior admission for similar issues in the past and he knows he will need IV diuresis but hopes to be discharged tomorrow.  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 At that visit, he wished for conservative management and refused ICD implantation. His note also mentions prior hx of declining ischemic work-up with cath   In th ED, he was found to  have BNP of >4500. Chest X-ray showed cardiomegaly w/ right base atelectasis. UA was unremarkable for UTI. He was found to have desaturation event with labored breathing in ambulation and IMTS was consulted for admission.  Meds: Current Meds  Medication Sig   acetaminophen (TYLENOL) 500 MG tablet Take 1,000 mg by mouth every 6 (six) hours as needed for moderate pain or headache.   aspirin EC 81 MG tablet Take 81 mg by mouth daily.   carvedilol (COREG) 12.5 MG tablet Take 1 tablet (12.5 mg total) by mouth 2 (two) times daily. (Patient taking differently: Take 6.25 mg by mouth 2 (two) times daily. )   dextromethorphan-guaiFENesin (MUCINEX DM) 30-600 MG 12hr tablet Take 1 tablet by mouth daily as needed for cough.   finasteride (PROSCAR) 5 MG tablet Take 1 tablet (5 mg total) by mouth daily.   furosemide (LASIX) 20 MG tablet Take 1 tablet (20 mg total) by mouth daily.   Multiple Vitamin (MULTIVITAMIN) tablet Take 1 tablet by mouth daily.   tamsulosin (FLOMAX) 0.4 MG CAPS capsule Take 1 capsule (0.4 mg total) by mouth daily.   Allergies: Allergies as of 02/04/2019   (No Known Allergies)   Past Medical History:  Diagnosis Date   Acute cholecystitis 11/23/2017   Acute respiratory failure with hypoxia (HCC)    Acute upper back pain    AKI (acute kidney injury) ([place])    Alcohol abuse 02/26/2015  Anemia, deficiency    Back pain    LUMBOSACRAL   Bronchitis, complicated    Cardiomegaly    Chronic combined systolic and diastolic CHF (congestive heart failure) (HCC)    Chronic pain    HIGH RISK MED USE COMPREHENSIVE HIGH RISK   CKD (chronic kidney disease), stage III ([place])    Demand ischemia ([place])    a. minimally elevated troponin peak 0.11 in 2016, felt demand ischemia. Cath offered but pt wished to have done back in [place].   Diabetes mellitus without complication (HCC)    Edema, leg    Elevated troponin 02/26/2015   Essential hypertension    Fatigue    Heart  abnormality    Hyperlipidemia    Hypertension    Neck pain, acute    PVD (peripheral vascular disease) (HCC)    Renal lesion 11/23/2017   SOB (shortness of breath) on exertion    Solitary kidney    Vitamin D deficiency    Family History:  Sister has severe heart disease currently on hospice. Family History  Problem Relation Age of Onset   Other Mother        died in her sleep at 60   Hypertension Sister    Hyperlipidemia Sister    Diabetes Mellitus II Sister    Heart disease Sister    CAD Neg Hx        neg hx premature CAD   Social History:  Used to be 2 pack daily smoker but quit when diagnosed with heart disease. Used to drink 1/2 pint liquor daily but has been sober for 3 months once CHF symptoms got bad. Lives alone. Used to work as Education officer, museum. Able to perform ADLs, IADLS. Denies any illicit substance use.   Review of Systems: A complete ROS was negative except as per HPI.  Physical Exam: Blood pressure 132/79, pulse 99, temperature 97.7 F (36.5 C), temperature source Oral, resp. rate (!) 34, height 5\' 11"  (1.803 m), weight 90.7 kg, SpO2 97 %.  Gen: Well-developed, well nourished, NAD HEENT: NCAT head, EOMI, PERRL, + icteric sclerae Neck: supple, ROM intact, + JVD CV: RRR, S1, S2 normal, No rubs, no murmurs, no gallops, event monitor in place Pulm: Bibasilar rales, no wheezes Abd: Soft, BS+, Non-tenderness, + abdominal wall pitting edema Extm: ROM intact, Peripheral pulses intact, 3+ pitting edema up on upper thigh GU: Significant penile & scrotal edema. Yellow discharge around foley cather. Intact and functioning. Skin: Dry, Warm, normal turgor, no rashes, lesions, wounds.  Neuro: AAOx3  EKG: personally reviewed my interpretation is normal sinus, no significant ST changes from prior EKG  CXR: personally reviewed my interpretation is poor inspiratory effort, rotated film, + vascular congestion, no lobar consolidation, no pleural effusion.  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 Dietary indiscretion appears to be the primary cause of his acute HFrEF symptoms. He mentions recent treatment for UTI but foley is in place and appears to be functioning and UA negative for infection. He has significant anasarca on exam and will require inpatient admission for IV diuresis. Follows with Dr.Rosario (primary cardiologist)  Dyspnea, Swelling 2/2 acute on chronic systolic heart failure exacerbation TTE from [place] 12/2018: EF 25-30% biventricular  heart failure. BNP>4500. Current BP 132/79. O2 sat 97. Subjective dyspnea. Anasarca on exam. Recent. high sodium dietary content. X-ray w/ vascular congestion.  - Start IV Furosemide 40mg  BID - Trend BMP - Mag level - Strict I&Os - Daily Weights - Fluid restriction - Keep O2 sat >88 - Replenish K as needed >4.0 - C/w home meds: asa 81mg  daily, carvedilol 12.5mg  BID  AKI on CKD 2/2 hypervolemia Significant volume up on exam. Admit creatinine 2.38. Baseline creatinine 1.5 - Trend renal function - Avoid nephrotoxic meds - Hold home Entresto  BPH Hx of BPH w/ urinary retention. Per pt urology declined surgical intervention due to cardiac disease. On medical management only w/ chronic foley - C/w foley care - C/w home meds: tamsulosin 0.4mg  daily, finasteride 5mg  daily  DVT prophx: Lovenox Diet: Cardiac w/ fluid restrict Bowel: Senokot Code: Full  Dispo: Admit patient to Inpatient with expected length of stay greater than 2 midnights.  Signed: [name], MD 02/04/2019, 12:58 PM  Pager: [phone]

## 2019-02-05 DIAGNOSIS — N179 Acute kidney failure, unspecified: Secondary | ICD-10-CM | POA: Diagnosis present

## 2019-02-05 DIAGNOSIS — I444 Left anterior fascicular block: Secondary | ICD-10-CM

## 2019-02-05 DIAGNOSIS — Z87891 Personal history of nicotine dependence: Secondary | ICD-10-CM

## 2019-02-05 DIAGNOSIS — D649 Anemia, unspecified: Secondary | ICD-10-CM

## 2019-02-05 LAB — BASIC METABOLIC PANEL
Anion gap: 11 (ref 5–15)
BUN: 48 mg/dL — ABNORMAL HIGH (ref 8–23)
CO2: 22 mmol/L (ref 22–32)
Calcium: 8.7 mg/dL — ABNORMAL LOW (ref 8.9–10.3)
Chloride: 106 mmol/L (ref 98–111)
Creatinine, Ser: 2.46 mg/dL — ABNORMAL HIGH (ref 0.61–1.24)
GFR calc Af Amer: 29 mL/min — ABNORMAL LOW (ref >60)
GFR calc non Af Amer: 25 mL/min — ABNORMAL LOW (ref >60)
Glucose, Bld: 125 mg/dL — ABNORMAL HIGH (ref 70–99)
Potassium: 4 mmol/L (ref 3.5–5.1)
Sodium: 139 mmol/L (ref 135–145)

## 2019-02-05 LAB — URINE CULTURE: Culture: NO GROWTH

## 2019-02-05 LAB — LIPID PANEL
Cholesterol: 109 mg/dL (ref 0–200)
HDL: 32 mg/dL — ABNORMAL LOW (ref >40)
LDL Cholesterol: 65 mg/dL (ref 0–99)
Total CHOL/HDL Ratio: 3.4 RATIO
Triglycerides: 58 mg/dL (ref <150)
VLDL: 12 mg/dL (ref 0–40)

## 2019-02-05 LAB — MAGNESIUM: Magnesium: 2 mg/dL (ref 1.7–2.4)

## 2019-02-05 NOTE — Progress Notes (Signed)
   Subjective:  [name] is a 78 y.o. 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 He denies any chest pain, SOB, palpitation, abdominal pain. His anasarca has also improved as well. He was able to decrease the angle of his bed to sleep without having any difficulties.  Objective:  Vital signs in last 24 hours: Vitals:   02/04/19 1706 02/04/19 1930 02/05/19 0000 02/05/19 0344  BP: 124/85 122/89 115/74 98/70  Pulse: (!) 101 (!) 101 88 79  Resp: 18 18 20 20   Temp:  (!) 97.3 F (36.3 C)  97.6 F (36.4 C)  TempSrc:  Oral Oral Oral  SpO2: 97% 100% 100% 100%  Weight:    96.4 kg  Height:       Physical Exam  Constitutional: He is well-developed, well-nourished, and in no distress. No distress.  HENT:  Mouth/Throat: Oropharynx is clear and moist.  Eyes: Conjunctivae are normal.  Neck: Normal range of motion. Neck supple. JVD (improved compared to yesterday) present.  Cardiovascular: Normal rate, regular rhythm, normal heart sounds and intact distal pulses.  Pulmonary/Chest: Effort normal and breath sounds normal. He has no rales.  Abdominal: Soft. Bowel sounds are normal. He exhibits distension (reduced abdominal wall edema compared to yesterday).  Genitourinary:    Genitourinary Comments: + penis and scrotal edema   Musculoskeletal: Normal range of motion.        General: Edema (3+ pitting edema up to upper thighs) present.  Skin: Skin is warm and dry. He is not diaphoretic.   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 Diuresed well overnight with 2L out via foley. No  evidence of obstruction on renal ultrasound. Will continue inpatient admission for IV diuresis with electrolyte repletion.  Dyspnea, Swelling 2/2 acute on chronic systolic heart failure exacerbation TTE from [place] 12/2018: EF 25-30% biventricular heart failure. BNP>4500. Anasarca on exam. K 4.0 this am. Current weight 96.4kg. Unclear dry weight. 8-10 beats of unsustained v-tach overnight. - C/w IV Furosemide 40mg  BID - Trend BMP - Mag level - Strict I&Os - Daily Weights - Fluid restriction - Keep O2 sat >88 - Replenish K as needed >4.0 - C/w home meds: asa 81mg  daily, carvedilol 12.5mg  BID  AKI on CKD 2/2 hypervolemia Continue to be volume up on. Admit creatinine 2.38->2.46. Baseline creatinine 1.5 - Trend renal function - Avoid nephrotoxic meds - Hold home Entresto  BPH Hx of BPH w/ urinary retention. Per pt urology declined surgical intervention due to cardiac disease. On medical management only w/ chronic foley - C/w foley care - C/w home meds: tamsulosin 0.4mg  daily, finasteride 5mg  daily  DVT prophx: Lovenox Diet: Cardiac w/ fluid restrict Bowel: Senokot Code: Full  Dispo: Anticipated discharge in approximately 2-3 day(s).   Troy Anis, MD 02/05/2019, 7:06 AM Pager: [phone]

## 2019-02-05 NOTE — Progress Notes (Signed)
  Date: 02/05/2019  Patient name: [name]  Medical record number: [id number]  Date of birth: [birth year]   I have seen and evaluated this patient and I have discussed the plan of care with the house staff. Please see their note for complete details. I concur with their findings with the following additions/corrections:   Please see my separate attestation of the H&P from 02/04/2019.  Lenice Pressman, M.D., Ph.D. 02/05/2019, 11:49 AM

## 2019-02-05 NOTE — Progress Notes (Signed)
Pt. had 18 bts run of VT.Dr. Truman Hayward paged made ware.

## 2019-02-06 LAB — BASIC METABOLIC PANEL
Anion gap: 10 (ref 5–15)
BUN: 52 mg/dL — ABNORMAL HIGH (ref 8–23)
CO2: 23 mmol/L (ref 22–32)
Calcium: 8.7 mg/dL — ABNORMAL LOW (ref 8.9–10.3)
Chloride: 105 mmol/L (ref 98–111)
Creatinine, Ser: 2.61 mg/dL — ABNORMAL HIGH (ref 0.61–1.24)
GFR calc Af Amer: 27 mL/min — ABNORMAL LOW (ref >60)
GFR calc non Af Amer: 23 mL/min — ABNORMAL LOW (ref >60)
Glucose, Bld: 129 mg/dL — ABNORMAL HIGH (ref 70–99)
Potassium: 4.4 mmol/L (ref 3.5–5.1)
Sodium: 138 mmol/L (ref 135–145)

## 2019-02-06 LAB — MAGNESIUM: Magnesium: 2 mg/dL (ref 1.7–2.4)

## 2019-02-06 MED ORDER — FUROSEMIDE 10 MG/ML IJ SOLN: 60.0000 mg | Freq: Two times a day (BID) | INTRAMUSCULAR | Status: DC

## 2019-02-06 MED ORDER — FUROSEMIDE 10 MG/ML IJ SOLN
60.0000 mg | Freq: Three times a day (TID) | INTRAMUSCULAR | Status: DC
  Administered 2019-02-06: 60 mg via INTRAVENOUS
  Filled 2019-02-06: qty 6

## 2019-02-06 MED ORDER — FUROSEMIDE 10 MG/ML IJ SOLN
40.0000 mg | Freq: Three times a day (TID) | INTRAMUSCULAR | Status: DC
  Administered 2019-02-06 – 2019-02-07 (×5): 40 mg via INTRAVENOUS
  Filled 2019-02-06 (×5): qty 4

## 2019-02-06 NOTE — Progress Notes (Signed)
  Date: 02/06/2019  Patient name: [name]  Medical record number: [id number]  Date of birth: 1941/07/08   I have seen and evaluated this patient and I have discussed the plan of care with the house staff. Please see their note for complete details. I concur with their findings with the following additions/corrections:   78 year old man with history of HFrEF for acute on chronic heart failure.  Only a small amount of diuresis yesterday, as noted by Dr. Truman Hayward, did not receive 1 of the intended doses of furosemide.  On exam, he still has JVP to the angle of the mandible, bibasilar crackles, lower extremity edema, and penile and scrotal swelling, although his edema has decreased slightly from yesterday.  We will increase the furosemide to 3 times daily.  He is having intermittent runs of NSVT on telemetry.  He was recently offered an ICD by Dr. Rayann Heman but declined  Lenice Pressman, M.D., Ph.D. 02/06/2019, 11:11 AM

## 2019-02-06 NOTE — Progress Notes (Signed)
   Subjective:  [name] is a 78 y.o. 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 He reports that he feels well today and denies shortness of breath, chest pain, palpitation. He endorses some improvement to his lower extremity edema. No acute complaints at this time.  Objective:  Vital signs in last 24 hours: Vitals:   02/05/19 0918 02/05/19 1150 02/05/19 1938 02/06/19 0511  BP: 104/72 98/68 103/72 111/85  Pulse: 79 75 86 74  Resp: 18 18 20 20   Temp: 97.8 F (36.6 C) 98.1 F (36.7 C) (!) 97.3 F (36.3 C) 98.5 F (36.9 C)  TempSrc: Oral Oral Oral   SpO2: 99% 100% 100% 100%  Weight:    96.4 kg  Height:       Gen: Well-developed, well nourished, NAD Neck: supple, ROM intact, + JVD CV: RRR, S1, S2 normal, No rubs, no murmurs, no gallops Pulm: CTAB, bibasilar rales Abd: Soft, BS+, Mild distension w/ minimal abdominal wall edema Extm: ROM intact, Peripheral pulses intact, 2+ pitting edema up to mid thighs GU: Penile and scrotal edema improved compared to yesterday, Foley cath in place Skin: Dry, Warm, normal turgor, no rashes, lesions, wounds.  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 Appeared to have missed one of his lasix doses yesterday and only diuresed 800cc after receiving afternoon dose. Will increase frequency of dosing with low concern about nighttime disturbances in setting of chronic foley use.  This will hopefully expedite his diuresis. Will also need to monitor renal function closely as his creatinine is slowly trending up.  Dyspnea, Swelling 2/2 acute on chronic systolic heart failure exacerbation Anasarca improving on exam. TTE from [facility] 12/2018: EF 25-30% biventricular heart failure. BNP>4500. [name] 4.4 this am.Current weight 96.4kg. Unclear dry weight.  - Start IV Furosemide60mg  TID - Trend BMP - Mag level - Strict I&Os - Daily Weights - Fluid restriction - Keep O2 sat >88 - Replenish K as needed >4.0 - C/w home meds:asa 81mg  daily, carvedilol 12.5mg  BID  AKI on CKD2/2 hypervolemia Continue to be volume up on. Admit creatinine 2.38->2.46->2.61. Baseline creatinine 1.5 - Trend renal function - Avoid nephrotoxic meds - Hold home Entresto  BPH Hx of BPH w/ urinary retention. Per pt urology declined surgical intervention due to cardiac disease. On medical management only w/ chronic foley - C/w foley care - C/w home meds: tamsulosin 0.4mg  daily, finasteride 5mg  daily  DVT prophx:Lovenox Diet:Cardiac w/ fluid restrict Bowel:Senokot Code:Full  Dispo: Anticipated discharge in approximately 2-3 day(s).   Troy Anis, MD 02/06/2019, 6:47 AM Pager: [phone]

## 2019-02-07 LAB — BASIC METABOLIC PANEL
Anion gap: 10 (ref 5–15)
BUN: 56 mg/dL — ABNORMAL HIGH (ref 8–23)
CO2: 24 mmol/L (ref 22–32)
Calcium: 8.6 mg/dL — ABNORMAL LOW (ref 8.9–10.3)
Chloride: 105 mmol/L (ref 98–111)
Creatinine, Ser: 2.55 mg/dL — ABNORMAL HIGH (ref 0.61–1.24)
GFR calc Af Amer: 27 mL/min — ABNORMAL LOW (ref >60)
GFR calc non Af Amer: 24 mL/min — ABNORMAL LOW (ref >60)
Glucose, Bld: 119 mg/dL — ABNORMAL HIGH (ref 70–99)
Potassium: 4.2 mmol/L (ref 3.5–5.1)
Sodium: 139 mmol/L (ref 135–145)

## 2019-02-07 LAB — MAGNESIUM: Magnesium: 1.9 mg/dL (ref 1.7–2.4)

## 2019-02-07 NOTE — Plan of Care (Signed)
  Problem: Education: Goal: Ability to demonstrate management of disease process will improve Outcome: Progressing Goal: Ability to verbalize understanding of medication therapies will improve Outcome: Progressing   Problem: Activity: Goal: Capacity to carry out activities will improve Outcome: Progressing   Problem: Cardiac: Goal: Ability to achieve and maintain adequate cardiopulmonary perfusion will improve Outcome: Progressing   Problem: Education: Goal: Knowledge of disease and its progression will improve Outcome: Progressing Goal: Individualized Educational Video(s) Outcome: Progressing   Problem: Fluid Volume: Goal: Compliance with measures to maintain balanced fluid volume will improve Outcome: Progressing   Problem: Health Behavior/Discharge Planning: Goal: Ability to manage health-related needs will improve Outcome: Progressing   Problem: Nutritional: Goal: Ability to make healthy dietary choices will improve Outcome: Progressing   Problem: Clinical Measurements: Goal: Complications related to the disease process, condition or treatment will be avoided or minimized Outcome: Progressing   Problem: Education: Goal: Knowledge of General Education information will improve Description Including pain rating scale, medication(s)/side effects and non-pharmacologic comfort measures Outcome: Progressing   Problem: Health Behavior/Discharge Planning: Goal: Ability to manage health-related needs will improve Outcome: Progressing   Problem: Clinical Measurements: Goal: Ability to maintain clinical measurements within normal limits will improve Outcome: Progressing Goal: Will remain free from infection Outcome: Progressing Goal: Diagnostic test results will improve Outcome: Progressing Goal: Respiratory complications will improve Outcome: Progressing Goal: Cardiovascular complication will be avoided Outcome: Progressing   Problem: Nutrition: Goal: Adequate  nutrition will be maintained Outcome: Progressing   Problem: Coping: Goal: Level of anxiety will decrease Outcome: Progressing   Problem: Elimination: Goal: Will not experience complications related to bowel motility Outcome: Progressing Goal: Will not experience complications related to urinary retention Outcome: Progressing   Problem: Pain Managment: Goal: General experience of comfort will improve Outcome: Progressing   Problem: Safety: Goal: Ability to remain free from injury will improve Outcome: Progressing   Problem: Skin Integrity: Goal: Risk for impaired skin integrity will decrease Outcome: Progressing

## 2019-02-07 NOTE — Care Management Important Message (Signed)
Important Message  Patient Details  Name: Kendel Bessey MRN: [id number] Date of Birth: September 21, 1940   Medicare Important Message Given:  Yes    [name] 02/07/2019, 1:08 PM

## 2019-02-07 NOTE — Progress Notes (Signed)
Pt had 19 Bts run of V tach. Upon assessment, pt denies chest pain, no complaints. Will monitor. Pt has intermittent NSVT since admission,not new.

## 2019-02-07 NOTE — Progress Notes (Signed)
  Date: 02/07/2019  Patient name: [name]  Medical record number: [id number]  Date of birth: 1941/02/25   I have seen and evaluated this patient and I have discussed the plan of care with the house staff. Please see their note for complete details. I concur with their findings with the following additions/corrections:   Diuresing well, still has JVD, bibasilar crackles, and swelling of his genitals and legs. Overall, swelling continues to improve. Will continue IV diuresis TID.   Lenice Pressman, M.D., Ph.D. 02/07/2019, 11:19 AM

## 2019-02-07 NOTE — Progress Notes (Signed)
Pt had a 6 beat run of v tach. MD notified. Will continue to monitor pt.

## 2019-02-07 NOTE — Progress Notes (Signed)
Subjective:  [name] is a 78 y.o. 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 We made him regarding the importance of an AICD placement given that we have observed several runs of non-sustained V-tach. He has followed up with Dr. Joylene Grapes and states he declined any procedure due to the associated risks. Prior to diagnosis of heart failure, his only complaint was shortness of breath. We had an ongoing discussion about the importance of AICD and he still continued to deny the procedure. He seems overwhelmed with all his medical issues. His philosophy is to "enjoy life while you can, enjoy yourself because once you get old EVERYTHING SHUTS DOWN."   Objective:  Vital signs in last 24 hours: Vitals:   02/06/19 0837 02/06/19 1207 02/06/19 1954 02/07/19 0424  BP: 117/73 105/71 116/68 104/67  Pulse: 77 80 92 78  Resp:  20 20 20   Temp:  98 F (36.7 C) 98.5 F (36.9 C) 98.6 F (37 C)  TempSrc:      SpO2: 100% 100% 100% 100%  Weight:    95.7 kg  Height:        Physical Exam  Constitutional: He is oriented to person, place, and time and well-developed, well-nourished, and in no distress. No distress.  HENT:  Mouth/Throat: Oropharynx is clear and moist.  Neck: Normal range of motion. Neck supple. JVD (slowly improving) present.  Cardiovascular: Normal rate, regular rhythm, normal heart sounds and intact distal pulses.  No murmur heard. Pulmonary/Chest: Effort normal. He has no wheezes. He has rales (right sided basilar rales).  Abdominal: Soft. Bowel sounds are normal. There is no abdominal tenderness.  Musculoskeletal: Normal range of motion.        General: Edema (2+ pitting edema up to high thighs) present.  Neurological: He is alert and oriented to person, place, and time.  Skin: Skin is warm and dry. He is not diaphoretic.   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 Continuing to diuresis well with 1.8L overnight. Weight trending down. Renal function stable. Based on prior cardiology notes, his dry weight appears to be around 91.5kg. Still has further diuresis to go.  Dyspnea, Swelling 2/2 acute on chronic systolic heart failure exacerbation Anasarca improving on exam. TTE from [facility] 12/2018: EF 25-30% biventricular heart failure. BNP>4500. [name] 4.4 this am.Current weight 96.4kg->95.7kg. Dry weight 91.5kg - C/w IV Furosemide40mg  TID - Trend BMP, Mag - Strict I&Os - Daily Weights - Fluid restriction - Keep O2 sat >88 - Replenish K as needed >4.0 - C/w home meds:asa 81mg  daily, carvedilol 12.5mg  BID  AKI on CKD2/2 hypervolemia Admit creatinine 2.38->2.46->2.61->2.55. Baseline creatinine 1.5 - Trend renal function - Avoid nephrotoxic meds - Hold home Entresto  BPH Hx of BPH w/ urinary retention. Per pt urology declined surgical intervention due to cardiac disease. On medical management only w/ chronic foley - C/w foley care - C/w home meds: tamsulosin 0.4mg  daily, finasteride  5mg  daily  DVT prophx:Lovenox Diet:Cardiac w/ fluid restrict Bowel:Senokot Code:Full  Dispo: Anticipated discharge in approximately 2-3 day(s).   [name], MD 02/07/2019, 6:32 AM Pager: [phone]

## 2019-02-08 LAB — BASIC METABOLIC PANEL WITH GFR
Anion gap: 9 (ref 5–15)
BUN: 61 mg/dL — ABNORMAL HIGH (ref 8–23)
CO2: 23 mmol/L (ref 22–32)
Calcium: 8.5 mg/dL — ABNORMAL LOW (ref 8.9–10.3)
Chloride: 104 mmol/L (ref 98–111)
Creatinine, Ser: 2.5 mg/dL — ABNORMAL HIGH (ref 0.61–1.24)
GFR calc Af Amer: 28 mL/min — ABNORMAL LOW
GFR calc non Af Amer: 24 mL/min — ABNORMAL LOW
Glucose, Bld: 99 mg/dL (ref 70–99)
Potassium: 4 mmol/L (ref 3.5–5.1)
Sodium: 136 mmol/L (ref 135–145)

## 2019-02-08 LAB — MAGNESIUM: Magnesium: 2.1 mg/dL (ref 1.7–2.4)

## 2019-02-08 LAB — GLUCOSE, CAPILLARY: Glucose-Capillary: 81 mg/dL (ref 70–99)

## 2019-02-08 MED ORDER — FUROSEMIDE 10 MG/ML IJ SOLN
60.0000 mg | Freq: Three times a day (TID) | INTRAMUSCULAR | Status: DC
  Administered 2019-02-08 – 2019-02-11 (×12): 60 mg via INTRAVENOUS
  Filled 2019-02-08 (×12): qty 6

## 2019-02-08 NOTE — Progress Notes (Signed)
Pt had a 5 beat run of vtach. MD notified. Will continue to monitor pt

## 2019-02-08 NOTE — Plan of Care (Signed)
  Problem: Education: Goal: Ability to demonstrate management of disease process will improve Outcome: Progressing Goal: Ability to verbalize understanding of medication therapies will improve Outcome: Progressing Goal: Individualized Educational Video(s) Outcome: Not Met (add Reason)   Problem: Activity: Goal: Capacity to carry out activities will improve Outcome: Progressing   Problem: Cardiac: Goal: Ability to achieve and maintain adequate cardiopulmonary perfusion will improve Outcome: Progressing   Problem: Education: Goal: Knowledge of disease and its progression will improve Outcome: Progressing Goal: Individualized Educational Video(s) Outcome: Not Met (add Reason)   Problem: Fluid Volume: Goal: Compliance with measures to maintain balanced fluid volume will improve Outcome: Progressing   Problem: Nutritional: Goal: Ability to make healthy dietary choices will improve Outcome: Progressing   Problem: Clinical Measurements: Goal: Complications related to the disease process, condition or treatment will be avoided or minimized Outcome: Progressing   Problem: Education: Goal: Knowledge of General Education information will improve Description: Including pain rating scale, medication(s)/side effects and non-pharmacologic comfort measures Outcome: Progressing   Problem: Health Behavior/Discharge Planning: Goal: Ability to manage health-related needs will improve Outcome: Progressing   Problem: Clinical Measurements: Goal: Ability to maintain clinical measurements within normal limits will improve Outcome: Progressing Goal: Will remain free from infection Outcome: Progressing Goal: Diagnostic test results will improve Outcome: Progressing Goal: Respiratory complications will improve Outcome: Progressing Goal: Cardiovascular complication will be avoided Outcome: Progressing   Problem: Nutrition: Goal: Adequate nutrition will be maintained Outcome:  Progressing   Problem: Coping: Goal: Level of anxiety will decrease Outcome: Progressing   Problem: Elimination: Goal: Will not experience complications related to bowel motility Outcome: Progressing Goal: Will not experience complications related to urinary retention Outcome: Progressing   Problem: Pain Managment: Goal: General experience of comfort will improve Outcome: Progressing   Problem: Elimination: Goal: Will not experience complications related to bowel motility Outcome: Progressing Goal: Will not experience complications related to urinary retention Outcome: Progressing   Problem: Skin Integrity: Goal: Risk for impaired skin integrity will decrease Outcome: Progressing   Problem: Safety: Goal: Ability to remain free from injury will improve Outcome: Progressing

## 2019-02-08 NOTE — Progress Notes (Signed)
Pt had a 5 beat run of vtach. MD notified. Will continue to monitor pt.

## 2019-02-08 NOTE — Progress Notes (Signed)
  Date: 02/08/2019  Patient name: [name]  Medical record number: [id number]  Date of birth: [birth year]   I have seen and evaluated this patient and I have discussed the plan of care with the house staff. Please see their note for complete details. I concur with their findings with the following additions/corrections:   78 year old man admitted with acute on chronic HFrEF as well as AKI on CKD stage III.  Diuresing well on IV furosemide 3 times daily, still has JVD, lower extremity edema, but is off of oxygen today.  We will need to continue IV diuresis until his volume status is optimized.  Renal function is stable.  Lenice Pressman, M.D., Ph.D. 02/08/2019, 2:57 PM

## 2019-02-08 NOTE — Progress Notes (Signed)
   Subjective:  [name] is a 78 y.o. M with PMH of HFrEF (EF 20-25), DM, HLD, HTN, PVD, CKD and BPH on chronic foley admit for lower extremity swelling on hospital day 4  Mr.[name] was examined and evaluated at bedside this morning. He states he feels well this morning and noticed significant reduction in swelling of his lower extremities and scrotum. Denies any chest pain, palpitations, dyspnea, or light-headedness. No other acute complaints this am.  Objective:  Vital signs in last 24 hours: Vitals:   02/07/19 1130 02/07/19 2055 02/08/19 0603 02/08/19 0610  BP: 108/82 113/81 122/75   Pulse: 77 79 72   Resp: 19 20 20    Temp:  97.6 F (36.4 C) 98 F (36.7 C)   TempSrc:  Oral Oral   SpO2: 100% 100% 97%   Weight:    95 kg  Height:       Gen: Well-developed, well nourished, NAD HEENT: EOMI, MMM Neck: supple, ROM intact, + JVD CV: RRR, S1, S2 normal, No rubs, no murmurs, no gallops Pulm: CTAB, No rales, no wheezes Abd: Soft, BS+, NTND, No rebound Extm: ROM intact, Peripheral pulses intact, 2+ pitting edema up to low thighs GU: Slightly edematous scrotum and penis Skin: Dry, Warm, normal turgor   Assessment/Plan:  Principal Problem:   Acute on chronic congestive heart failure (HCC) Active Problems:   Diabetes mellitus without complication (HCC)   CKD (chronic kidney disease), stage III (HCC)   Acute kidney injury superimposed on CKD (HCC)  Mr.[name] is 78 yo M w/ PMH of HFrEF (EF 20-25), DM, HLD, HTN, PVD, CKD, and BPH w/ indwelling foley presenting w/ shortness of breathand lower extremity edema 2/2 acute on chronic systolic heart failure exacerbation.Continuing to diuresis well with 2.2L overnight. Weight trending down. Renal function stable. Based on prior cardiology notes, his dry weight appears to be around 91.5kg. Will continue to monitor.  Dyspnea, Swelling 2/2 acute on chronic systolic heart failure exacerbation Anasarca improving on exam. TTE from  [facility] 12[facility]: EF 25-30% biventricular heart failure. BNP>4500. Marland KitchenK 4.0 this am.Current weight 96.4kg->95.7kg. 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 Baseline creatinine 1.5 - Trend renal function - Avoid nephrotoxic meds - Hold home Entresto  BPH Hx of BPH w/ urinary retention. Per pt urology declined surgical intervention due to cardiac disease. On medical management only w/ chronic foley - C/w foley care - C/w home meds: tamsulosin 0.4mg  daily, finasteride 5mg  daily  DVT prophx:Lovenox Diet:Cardiac w/ fluid restrict Bowel:Senokot Code:Full  Dispo: Anticipated discharge in approximately 2-3 day(s).   [name], MD 02/08/2019, 6:46 AM Pager: [phone]

## 2019-02-08 NOTE — Progress Notes (Signed)
Pt was admitted with foley that is chronic from home. MD paged to continue foley. Will continue to monitor pt.

## 2019-02-09 LAB — BASIC METABOLIC PANEL WITH GFR
Anion gap: 9 (ref 5–15)
BUN: 58 mg/dL — ABNORMAL HIGH (ref 8–23)
CO2: 25 mmol/L (ref 22–32)
Calcium: 8.6 mg/dL — ABNORMAL LOW (ref 8.9–10.3)
Chloride: 103 mmol/L (ref 98–111)
Creatinine, Ser: 2.24 mg/dL — ABNORMAL HIGH (ref 0.61–1.24)
GFR calc Af Amer: 32 mL/min — ABNORMAL LOW
GFR calc non Af Amer: 28 mL/min — ABNORMAL LOW
Glucose, Bld: 126 mg/dL — ABNORMAL HIGH (ref 70–99)
Potassium: 3.9 mmol/L (ref 3.5–5.1)
Sodium: 137 mmol/L (ref 135–145)

## 2019-02-09 LAB — MAGNESIUM: Magnesium: 2 mg/dL (ref 1.7–2.4)

## 2019-02-09 NOTE — Care Management Important Message (Signed)
Important Message  Patient Details  Name: [name] MRN: [id number] Date of Birth: Feb 06, 1941   Medicare Important Message Given:  Yes    [name] 02/09/2019, 11:42 AM

## 2019-02-09 NOTE — Progress Notes (Signed)
  Date: 02/09/2019  Patient name: [name]  Medical record number: [id number]  Date of birth: 08-05-1941   I have seen and evaluated this patient and I have discussed the plan of care with the house staff. Please see their note for complete details. I concur with their findings with the following additions/corrections:   78 year old man admitted with acute on chronic HFrEF as well as acute kidney injury on CKD 3.  Continues to diurese well on furosemide IV 3 times daily, weight is down 2.6 kg overall.  Still has JVD on exam, bibasilar crackles, lower extremity edema, as well as penile and scrotal swelling.  We will continue diuresis, I suspect he has a few more days to go until we will optimize his volume status.  Creatinine continues to move toward his baseline, will continue holding Entresto for now and restart when his renal function has normalized.  Lenice Pressman, M.D., Ph.D. 02/09/2019, 3:03 PM

## 2019-02-09 NOTE — Progress Notes (Signed)
Subjective:  [name] is a 78 y.o. M with PMH of HFrEF (EF 20-25), DM, HLD, HTN, PVD, CKD and BPH on chronic foley admit for lower extremity swelling on hospital day 5  Mr. Schreifels was examined at bedside this morning and reports that he is doing well. He usually gets about 2 hours of sleep per night. He denies chest pain. We made him aware that his weight is down about 8 pounds since admission. He still endorses scrotal swelling but with noticeable improvement. He ambulates in the room and endorses dyspnea though not severe.   Objective:  Vital signs in last 24 hours: Vitals:   02/08/19 2003 02/08/19 2133 02/09/19 0451 02/09/19 0459  BP: 120/75 118/73 124/74   Pulse: 82 83 79   Resp: 20  20   Temp: 97.8 F (36.6 C)  98 F (36.7 C)   TempSrc: Oral  Oral   SpO2: 100% 100% 100%   Weight:    93.8 kg  Height:       Gen: Well-developed, well nourished, NAD HEENT: EOMI, MMM Neck: supple, ROM intact, + JVD CV: RRR, S1, S2 normal, No rubs, no murmurs, no gallops Pulm: CTAB, No rales, no wheezes Abd: Soft, BS+, NTND, No rebound Extm: ROM intact, Peripheral pulses intact, 2+ pitting edema up to low thighs GU: Slightly edematous scrotum and penis Skin: Dry, Warm, normal turgor   Physical Exam  Constitutional: He is oriented to person, place, and time and well-developed, well-nourished, and in no distress. No distress.  HENT:  Mouth/Throat: Oropharynx is clear and moist.  Neck: Normal range of motion. Neck supple. JVD present.  Cardiovascular: Normal rate, regular rhythm, normal heart sounds and intact distal pulses.  No murmur heard. Pulmonary/Chest: Effort normal and breath sounds normal. He has no wheezes. He has no rales.  Abdominal: Soft. Bowel sounds are normal.  Genitourinary:    Genitourinary Comments: Scrotal edema improved. Penile edema stable   Musculoskeletal: Normal range of motion.        General: Edema (2+ pitting edema up to low thighs) present.   Neurological: He is alert and oriented to person, place, and time.  Skin: Skin is warm and dry.   Assessment/Plan:  Principal Problem:   Acute on chronic congestive heart failure (HCC) Active Problems:   Diabetes mellitus without complication (HCC)   CKD (chronic kidney disease), stage III (HCC)   Acute kidney injury superimposed on CKD (HCC)  Mr.Sumpter is 78 yo M w/ PMH of HFrEF (EF 20-25), DM, HLD, HTN, PVD, CKD, and BPH w/ indwelling foley presenting w/ shortness of breathand lower extremity edema 2/2 acute on chronic systolic heart failure exacerbation. Increased urinary output w/ higher dose of IV lasix. Renal function improving. Weight trending down but not yet at dry weight.  Dyspnea, Swelling 2/2 acute on chronic systolic heart failure exacerbation Anasarca improving. 2.8L output yesterday. TTE from [facility] 12/2018: EF 25-30% biventricular heart failure. BNP>4500.K 3.9 this am.Current weight 96.4kg->95.7kg->93.8kg. 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 Baseline creatinine 1.5 - Trend renal function - Avoid nephrotoxic meds - Hold home Entresto  BPH Hx of BPH w/ urinary retention. Per pt urology declined surgical intervention due to cardiac disease. On medical management only w/ chronic foley - C/w foley care -  C/w home meds: tamsulosin 0.4mg  daily, finasteride 5mg  daily  DVT prophx:Lovenox Diet:Cardiac w/ fluid restrict Bowel:Senokot Code:Full  Dispo: Anticipated discharge in approximately 2-3 day(s).   [name], MD 02/09/2019, 6:39 AM Pager: [phone]

## 2019-02-09 NOTE — Progress Notes (Signed)
Pt had a 20 beat run of vtach. MD aware. Will continue to monitor.

## 2019-02-10 LAB — BASIC METABOLIC PANEL
Anion gap: 9 (ref 5–15)
BUN: 52 mg/dL — ABNORMAL HIGH (ref 8–23)
CO2: 27 mmol/L (ref 22–32)
Calcium: 8.5 mg/dL — ABNORMAL LOW (ref 8.9–10.3)
Chloride: 101 mmol/L (ref 98–111)
Creatinine, Ser: 2.09 mg/dL — ABNORMAL HIGH (ref 0.61–1.24)
GFR calc Af Amer: 35 mL/min — ABNORMAL LOW (ref >60)
GFR calc non Af Amer: 30 mL/min — ABNORMAL LOW (ref >60)
Glucose, Bld: 130 mg/dL — ABNORMAL HIGH (ref 70–99)
Potassium: 3.5 mmol/L (ref 3.5–5.1)
Sodium: 137 mmol/L (ref 135–145)

## 2019-02-10 LAB — MAGNESIUM: Magnesium: 1.9 mg/dL (ref 1.7–2.4)

## 2019-02-10 MED ORDER — MUSCLE RUB 10-15 % EX CREA
TOPICAL_CREAM | CUTANEOUS | Status: DC | PRN
  Administered 2019-02-10 (×2): via TOPICAL
  Filled 2019-02-10: qty 85

## 2019-02-10 NOTE — Progress Notes (Signed)
PT Cancellation Note  Patient Details Name: [name] MRN: [id number] DOB: 1941/08/12   Cancelled Treatment:    Reason Eval/Treat Not Completed: Other (comment).  Pt was eating his meal when PT arrived, and will try again as time and pt allow.   Ramond Dial 02/10/2019, 11:58 AM   Mee Hives, PT MS Acute Rehab Dept. Number: [place] and [place]

## 2019-02-10 NOTE — Evaluation (Signed)
Physical Therapy Evaluation Patient Details Name: [name] MRN: [id number] DOB: 01/03/1941 Today's Date: 02/10/2019   History of Present Illness  78 yo male with onset of LE edema and perineal edema was admitted, working on acute CHF and note cardiomegaly, EF 20-25% and chronic foley.  PMHx:  CHF, AKI, PVD, HTN, CKD3, DM  Clinical Impression  Pt was seen for mobility and strength testing after his lunch, having some difficulty with peri pain due to edema but able to walk.  He is expecting to go home with some family help, not fully worked out yet.  He is able to walk on RW better due to pain reduction of the support, and recommend one for home but may not need by DC.  Follow acutely for progression of gait, and may take on the stairs next visit to cover this skill for home.  Follow up with HHPT as pt is mainly home alone and will need some recovery of strength in hips to reduce his fall risk.    Follow Up Recommendations Home health PT;Supervision for mobility/OOB    Equipment Recommendations  Rolling walker with 5" wheels    Recommendations for Other Services       Precautions / Restrictions Precautions Precautions: Fall Precaution Comments: foley cath, R eye blind Restrictions Weight Bearing Restrictions: No Other Position/Activity Restrictions: has chronic L ankle pain      Mobility  Bed Mobility               General bed mobility comments: up in chair when PT arrived but could transfer with S  Transfers Overall transfer level: Needs assistance Equipment used: Rolling walker (2 wheeled) Transfers: Sit to/from Stand Sit to Stand: Supervision         General transfer comment: released walker near chair and turned to sit with no contact from PT  Ambulation/Gait Ambulation/Gait assistance: Min guard(for safety due to pain) Gait Distance (Feet): 45 Feet Assistive device: Rolling walker (2 wheeled);1 person hand held assist Gait Pattern/deviations:  Step-through pattern;Wide base of support;Trunk flexed Gait velocity: reduced Gait velocity interpretation: <1.31 ft/sec, indicative of household ambulator General Gait Details: pt walks with some moments of very short hesitation due to peri pain.  Stairs            Wheelchair Mobility    Modified Rankin (Stroke Patients Only)       Balance Overall balance assessment: Needs assistance Sitting-balance support: Feet supported Sitting balance-Leahy Scale: Good     Standing balance support: Bilateral upper extremity supported;During functional activity Standing balance-Leahy Scale: Good Standing balance comment: fair dynamic balance, walker assisting with pain                             Pertinent Vitals/Pain Pain Assessment: Faces Faces Pain Scale: Hurts little more Pain Location: perineum with edema Pain Descriptors / Indicators: Sore Pain Intervention(s): Limited activity within patient's tolerance;Monitored during session;Premedicated before session;Repositioned    Home Living Family/patient expects to be discharged to:: Private residence Living Arrangements: Alone Available Help at Discharge: Family;Friend(s);Available PRN/intermittently Type of Home: House Home Access: Level entry     Home Layout: One level Home Equipment: Walker - 4 wheels;Cane - single point Additional Comments: has been independent with self care and household    Prior Function Level of Independence: Independent with assistive device(s)         Comments: SPC     Hand Dominance   Dominant Hand: Right  Extremity/Trunk Assessment   Upper Extremity Assessment Upper Extremity Assessment: Overall WFL for tasks assessed    Lower Extremity Assessment Lower Extremity Assessment: Generalized weakness    Cervical / Trunk Assessment Cervical / Trunk Assessment: Normal  Communication   Communication: No difficulties  Cognition Arousal/Alertness: Awake/alert Behavior  During Therapy: WFL for tasks assessed/performed Overall Cognitive Status: Within Functional Limits for tasks assessed                                        General Comments General comments (skin integrity, edema, etc.): pt is up to walk with RW for pain management, note he is on fluid restrictions but permitted ice water by nursing after his tray arrived    Exercises     Assessment/Plan    PT Assessment Patient needs continued PT services  PT Problem List Decreased strength;Decreased range of motion;Decreased activity tolerance;Decreased balance;Decreased mobility;Decreased coordination;Cardiopulmonary status limiting activity;Decreased skin integrity;Pain       PT Treatment Interventions DME instruction;Gait training;Functional mobility training;Therapeutic activities;Therapeutic exercise;Balance training;Neuromuscular re-education;Patient/family education    PT Goals (Current goals can be found in the Care Plan section)  Acute Rehab PT Goals Patient Stated Goal: to go home and get family to stay with him PT Goal Formulation: With patient Time For Goal Achievement: 02/24/19 Potential to Achieve Goals: Good    Frequency Min 3X/week   Barriers to discharge Decreased caregiver support home alone with pain and weakness of hips    Co-evaluation               AM-PAC PT "6 Clicks" Mobility  Outcome Measure Help needed turning from your back to your side while in a flat bed without using bedrails?: None Help needed moving from lying on your back to sitting on the side of a flat bed without using bedrails?: A Little Help needed moving to and from a bed to a chair (including a wheelchair)?: A Little Help needed standing up from a chair using your arms (e.g., wheelchair or bedside chair)?: A Little Help needed to walk in hospital room?: A Little Help needed climbing 3-5 steps with a railing? : A Lot 6 Click Score: 18    End of Session Equipment Utilized  During Treatment: Gait belt Activity Tolerance: Patient limited by pain Patient left: in chair;with call bell/phone within reach;with chair alarm set Nurse Communication: Mobility status;Other (comment)(nurse approved more water for pt) PT Visit Diagnosis: Unsteadiness on feet (R26.81);Muscle weakness (generalized) (M62.81)    Time: 4917-9150 PT Time Calculation (min) (ACUTE ONLY): 30 min   Charges:   PT Evaluation $PT Eval Moderate Complexity: 1 Mod PT Treatments $Gait Training: 8-22 mins       Ramond Dial 02/10/2019, 1:01 PM  Mee Hives, PT MS Acute Rehab Dept. Number: [place] and [place]

## 2019-02-10 NOTE — Evaluation (Signed)
Occupational Therapy Evaluation Patient Details Name: [name] MRN: [id number] DOB: 1941-06-01 Today's Date: 02/10/2019    History of Present Illness 78 yo male with onset of LE edema and perineal edema was admitted, working on acute CHF and note cardiomegaly, EF 20-25% and chronic foley.  PMHx:  CHF, AKI, PVD, HTN, CKD3, DM   Clinical Impression   PTA patient independent ADLS/IADLs using cane for mobility. Admitted for above and limited by problem list below, including pain, impaired balance, and decreased activity tolerance.  He demonstrates ability to complete UB ADLs with setup assist, LB ADIs with min assist, transfers with close supervision and in room mobility with min guard fading to supervision using RW. Significantly limited by pain in perineal area and believe he will benefit from scrotal sling. He will benefit from continued OT services while admitted and after dc at [facility] level in order to maximize independence and safety with ADLs/mobility.     Follow Up Recommendations  Home health OT;Supervision - Intermittent    Equipment Recommendations  3 in 1 bedside commode    Recommendations for Other Services       Precautions / Restrictions Precautions Precautions: Fall Precaution Comments: foley cath, R eye blind Restrictions Weight Bearing Restrictions: No Other Position/Activity Restrictions: has chronic L ankle pain      Mobility Bed Mobility Overal bed mobility: Needs Assistance Bed Mobility: Sit to Supine       Sit to supine: Supervision   General bed mobility comments: no assist required  Transfers Overall transfer level: Needs assistance Equipment used: Rolling walker (2 wheeled) Transfers: Sit to/from Stand Sit to Stand: Supervision         General transfer comment: supervision for safety, cueing for walker mgmt     Balance Overall balance assessment: Needs assistance Sitting-balance support: Feet supported Sitting balance-Leahy Scale:  Good     Standing balance support: Bilateral upper extremity supported;During functional activity Standing balance-Leahy Scale: Good Standing balance comment: relaint on UE support dynamically                           ADL either performed or assessed with clinical judgement   ADL Overall ADL's : Needs assistance/impaired     Grooming: Supervision/safety;Standing   Upper Body Bathing: Supervision/ safety;Set up;Sitting   Lower Body Bathing: Sit to/from stand;Supervison/ safety Lower Body Bathing Details (indicate cue type and reason): close supervision sit<>stand, figure 4 technique for LB in sitting  Upper Body Dressing : Set up;Sitting   Lower Body Dressing: Minimal assistance;Sit to/from stand Lower Body Dressing Details (indicate cue type and reason): close supervision sit<>stand, requires increased effort for figure 4 technique and a littel assist for socks Toilet Transfer: Min guard;Ambulation;RW Toilet Transfer Details (indicate cue type and reason): simulated from recliner          Functional mobility during ADLs: Min guard;Supervision/safety;Rolling walker(min guard fading to supervision ) General ADL Comments: pt limited by decreased act tolerance, pain     Vision         Perception     Praxis      Pertinent Vitals/Pain Pain Assessment: Faces Faces Pain Scale: Hurts little more Pain Location: perineum with edema Pain Descriptors / Indicators: Sore Pain Intervention(s): Limited activity within patient's tolerance;Monitored during session;Repositioned     Hand Dominance Right   Extremity/Trunk Assessment Upper Extremity Assessment Upper Extremity Assessment: Overall WFL for tasks assessed   Lower Extremity Assessment Lower Extremity Assessment: Defer to PT evaluation  Cervical / Trunk Assessment Cervical / Trunk Assessment: Normal   Communication Communication Communication: No difficulties   Cognition Arousal/Alertness:  Awake/alert Behavior During Therapy: WFL for tasks assessed/performed Overall Cognitive Status: Within Functional Limits for tasks assessed                                     General Comments  discussed benefits of scrotal sling    Exercises Exercises: Other exercises(4+ hips and otherwise WFL LE's)   Shoulder Instructions      Home Living Family/patient expects to be discharged to:: Private residence Living Arrangements: Alone Available Help at Discharge: Family;Friend(s);Available PRN/intermittently Type of Home: Apartment Home Access: Level entry     Home Layout: One level     Bathroom Shower/Tub: Teacher, early years/pre: Standard     Home Equipment: Environmental consultant - 4 wheels;Cane - single point   Additional Comments: uses toilet frame over commode      Prior Functioning/Environment Level of Independence: Independent with assistive device(s)        Comments: used SPC for mobility, independent ADLs/IADLs (not driving)         OT Problem List: Decreased activity tolerance;Impaired balance (sitting and/or standing);Increased edema;Pain;Decreased knowledge of precautions;Decreased knowledge of use of DME or AE      OT Treatment/Interventions: Self-care/ADL training;DME and/or AE instruction;Balance training;Patient/family education;Therapeutic activities;Energy conservation    OT Goals(Current goals can be found in the care plan section) Acute Rehab OT Goals Patient Stated Goal: to go home and get family to stay with him OT Goal Formulation: With patient Time For Goal Achievement: 02/24/19 Potential to Achieve Goals: Good  OT Frequency: Min 2X/week   Barriers to D/C:            Co-evaluation              AM-PAC OT "6 Clicks" Daily Activity     Outcome Measure Help from another person eating meals?: None Help from another person taking care of personal grooming?: None Help from another person toileting, which includes using  toliet, bedpan, or urinal?: A Little Help from another person bathing (including washing, rinsing, drying)?: A Little Help from another person to put on and taking off regular upper body clothing?: None Help from another person to put on and taking off regular lower body clothing?: A Little 6 Click Score: 21   End of Session Equipment Utilized During Treatment: Rolling walker Nurse Communication: Mobility status  Activity Tolerance: Patient tolerated treatment well Patient left: in bed;with call bell/phone within reach;with bed alarm set  OT Visit Diagnosis: Other abnormalities of gait and mobility (R26.89);Pain Pain - part of body: (scrotum )                Time: 0940-7680 OT Time Calculation (min): 19 min Charges:  OT General Charges $OT Visit: 1 Visit OT Evaluation $OT Eval Moderate Complexity: [place], OT Acute Rehabilitation Services Pager [phone] Office [phone]   Delight Stare 02/10/2019, 1:58 PM

## 2019-02-10 NOTE — Progress Notes (Signed)
  Date: 02/10/2019  Patient name: Daud Cayer  Medical record number: [id number]  Date of birth: 1941/05/13   I have seen and evaluated this patient and I have discussed the plan of care with the house staff. Please see their note for complete details. I concur with their findings with the following additions/corrections:   78 year old man admitted with acute on chronic HFrEF as well as acute kidney injury on CKD 3.  Continues to diurese well on furosemide IV 3 times daily, weight is down 4.1 kg overall.  Still has JVD on exam, bibasilar crackles, lower extremity edema, as well as penile and scrotal swelling, but all improving.  OT has recommended a scrotal sling for him today, which we will add for his comfort.    We will continue diuresis, I suspect he has a couple more days to go until we will optimize his volume status.  Creatinine continues to move toward his baseline, will continue holding Entresto for now and restart when his renal function has normalized.  Lenice Pressman, M.D., Ph.D. 02/10/2019, 5:27 PM

## 2019-02-10 NOTE — Progress Notes (Signed)
   Subjective: Patient stated that he is feeling much better following diuresis.  He stated that the swelling in his legs is improving as well as the swelling the scrotum.  He is somewhat concerned for a mild left ankle pain but states that this is chronic.  He denies fever, chills, nausea, vomiting, diarrhea, constipation, abdominal pain, chest pain, palpitation, dizziness, lightheadedness or myalgias.  Objective:  Vital signs in last 24 hours: Vitals:   02/09/19 1654 02/09/19 1953 02/10/19 0101 02/10/19 0609  BP: 110/70 125/76  115/75  Pulse: 76 86  83  Resp: 18 20  20   Temp: (!) 97.5 F (36.4 C) 97.6 F (36.4 C)  (!) 97.4 F (36.3 C)  TempSrc: Oral Oral  Oral  SpO2: 100% 100%  98%  Weight:   91.9 kg   Height:       General: A/O x4, in no acute distress, afebrile, nondiaphoretic Cardio: RRR, no mrg's  Pulmonary: CTA bilaterally, no wheezing or crackles  Abdomen: Bowel sounds normal, soft, nontender  MSK: BLE edematous, Left ankle symmetric to right, no increased edema or erythema present Psych: Appropriate affect, not depressed in appearance, engages well  Assessment/Plan:  Principal Problem:   Acute on chronic congestive heart failure (HCC) Active Problems:   Diabetes mellitus without complication (HCC)   CKD (chronic kidney disease), stage III ([place])   Acute kidney injury superimposed on CKD (HCC)  [name] is 78 yo M w/ PMH of HFrEF (EF 20-25), DM, HLD, HTN, PVD, CKD, and BPH w/ indwelling foley presenting w/ shortness of breathand lower extremity edema 2/2 acute on chronic systolic heart failure exacerbation. Increased urinary output w/ higher dose of IV lasix. Renal function slowly improving. Weight trending down but not yet at dry weight of ~91kg.  Dyspnea, Swelling 2/2 acute on chronic systolic heart failure exacerbation Anasarca improving. 2.8L output yesterday. TTE from [facility] 12/2018: EF 25-30% biventricular heart failure. BNP>4500.K 3.9 this am.Current weight  96.4kg->95.7kg->93.8kg. Dry weight 91.5kg but will challenge this given his continued edema on exam. Good output over the last 25 hours with improving serum creatinine. 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 Baseline creatinine ~1.55 per chart review. - Trend renal function - Avoid nephrotoxic meds - Hold home Entresto  BPH Hx of BPH w/ urinary retention. Per pt urology declined surgical intervention due to cardiac disease. On medical management only w/ chronic foley - C/w foley care - C/w home meds: tamsulosin 0.4mg  daily, finasteride 5mg  daily  DVT prophx:Lovenox Diet:Cardiac w/ fluid restrict Bowel:Senokot Code:Full Dispo: Anticipated discharge in approximately 2-3 day(s).   Troy Ludwig, MD 02/10/2019, 7:45 AM Pager: Pager# [phone]

## 2019-02-11 DIAGNOSIS — Z79899 Other long term (current) drug therapy: Secondary | ICD-10-CM

## 2019-02-11 DIAGNOSIS — Z960 Presence of urogenital implants: Secondary | ICD-10-CM

## 2019-02-11 DIAGNOSIS — N179 Acute kidney failure, unspecified: Secondary | ICD-10-CM

## 2019-02-11 DIAGNOSIS — E8770 Fluid overload, unspecified: Secondary | ICD-10-CM

## 2019-02-11 DIAGNOSIS — N183 Chronic kidney disease, stage 3 (moderate): Secondary | ICD-10-CM

## 2019-02-11 DIAGNOSIS — I130 Hypertensive heart and chronic kidney disease with heart failure and stage 1 through stage 4 chronic kidney disease, or unspecified chronic kidney disease: Principal | ICD-10-CM

## 2019-02-11 DIAGNOSIS — E1151 Type 2 diabetes mellitus with diabetic peripheral angiopathy without gangrene: Secondary | ICD-10-CM

## 2019-02-11 DIAGNOSIS — E785 Hyperlipidemia, unspecified: Secondary | ICD-10-CM

## 2019-02-11 DIAGNOSIS — R338 Other retention of urine: Secondary | ICD-10-CM

## 2019-02-11 DIAGNOSIS — N401 Enlarged prostate with lower urinary tract symptoms: Secondary | ICD-10-CM

## 2019-02-11 DIAGNOSIS — I5023 Acute on chronic systolic (congestive) heart failure: Secondary | ICD-10-CM

## 2019-02-11 DIAGNOSIS — N5089 Other specified disorders of the male genital organs: Secondary | ICD-10-CM

## 2019-02-11 DIAGNOSIS — E1122 Type 2 diabetes mellitus with diabetic chronic kidney disease: Secondary | ICD-10-CM

## 2019-02-11 LAB — BASIC METABOLIC PANEL
Anion gap: 9 (ref 5–15)
BUN: 45 mg/dL — ABNORMAL HIGH (ref 8–23)
CO2: 31 mmol/L (ref 22–32)
Calcium: 8.8 mg/dL — ABNORMAL LOW (ref 8.9–10.3)
Chloride: 97 mmol/L — ABNORMAL LOW (ref 98–111)
Creatinine, Ser: 2.14 mg/dL — ABNORMAL HIGH (ref 0.61–1.24)
GFR calc Af Amer: 34 mL/min — ABNORMAL LOW (ref >60)
GFR calc non Af Amer: 29 mL/min — ABNORMAL LOW (ref >60)
Glucose, Bld: 186 mg/dL — ABNORMAL HIGH (ref 70–99)
Potassium: 3.6 mmol/L (ref 3.5–5.1)
Sodium: 137 mmol/L (ref 135–145)

## 2019-02-11 MED ORDER — POTASSIUM CHLORIDE CRYS ER 20 MEQ PO TBCR
40.0000 meq | EXTENDED_RELEASE_TABLET | Freq: Once | ORAL | Status: AC
  Administered 2019-02-11: 40 meq via ORAL
  Filled 2019-02-11: qty 2

## 2019-02-11 NOTE — Progress Notes (Addendum)
Occupational Therapy Treatment Patient Details Name: [name] MRN: [id number] DOB: [birth year] Today's Date: 02/11/2019    History of present illness 78 yo male with onset of LE edema and perineal edema was admitted, working on acute CHF and note cardiomegaly, EF 20-25% and chronic foley.  PMHx:  CHF, AKI, PVD, HTN, CKD3, DM   OT comments  Patient standing at recliner upon entry.  Pt declining scrotal sling at this time, reports edema has improved and he feels that he does not need the extra support.  Patient completing transfers, mobility and ADls with supervision today, using SPC.  Pt requires increased time and effort for mobility but no physical assist required.Fatgiues easily and initated education on energy conservation. Will follow acutely, dc plan remains appropriate.    Follow Up Recommendations  Home health OT;Supervision - Intermittent    Equipment Recommendations  3 in 1 bedside commode    Recommendations for Other Services      Precautions / Restrictions Precautions Precautions: Fall Precaution Comments: foley cath, R eye blind Restrictions Weight Bearing Restrictions: No       Mobility Bed Mobility               General bed mobility comments: OOB upon entry  Transfers Overall transfer level: Needs assistance Equipment used: Straight cane Transfers: Sit to/from Stand Sit to Stand: Supervision         General transfer comment: supervision for safety     Balance Overall balance assessment: Needs assistance Sitting-balance support: Feet supported Sitting balance-Leahy Scale: Good     Standing balance support: Single extremity supported;During functional activity Standing balance-Leahy Scale: Good Standing balance comment: supervision for safety                            ADL either performed or assessed with clinical judgement   ADL Overall ADL's : Needs assistance/impaired     Grooming: Supervision/safety;Standing;Wash/dry  hands                   Toilet Transfer: Supervision/safety;Ambulation(cane)           Functional mobility during ADLs: Supervision/safety;Cane General ADL Comments: slow moving, but functional     Vision       Perception     Praxis      Cognition Arousal/Alertness: Awake/alert Behavior During Therapy: WFL for tasks assessed/performed Overall Cognitive Status: Within Functional Limits for tasks assessed                                          Exercises     Shoulder Instructions       General Comments pt declined scrotal sling, reports edema improving and denies need; initated education on energy conservation and safety    Pertinent Vitals/ Pain       Pain Assessment: Faces Faces Pain Scale: Hurts a little bit Pain Location: perineum with edema Pain Descriptors / Indicators: Sore Pain Intervention(s): Monitored during session;Repositioned  Home Living                                          Prior Functioning/Environment              Frequency  Min 2X/week  Progress Toward Goals  OT Goals(current goals can now be found in the care plan section)  Progress towards OT goals: Progressing toward goals  Acute Rehab OT Goals Patient Stated Goal: to go home and get family to stay with him OT Goal Formulation: With patient  Plan Discharge plan remains appropriate;Frequency remains appropriate    Co-evaluation                 AM-PAC OT "6 Clicks" Daily Activity     Outcome Measure   Help from another person eating meals?: None Help from another person taking care of personal grooming?: None Help from another person toileting, which includes using toliet, bedpan, or urinal?: None Help from another person bathing (including washing, rinsing, drying)?: None Help from another person to put on and taking off regular upper body clothing?: None Help from another person to put on and taking off  regular lower body clothing?: None 6 Click Score: 24    End of Session Equipment Utilized During Treatment: Other (comment)(SPC)  OT Visit Diagnosis: Other abnormalities of gait and mobility (R26.89);Pain Pain - part of body: (scrotum)   Activity Tolerance Patient tolerated treatment well   Patient Left in chair;with call bell/phone within reach;with chair alarm set   Nurse Communication Mobility status        Time: 2440-1027 OT Time Calculation (min): 14 min  Charges: OT General Charges $OT Visit: 1 Visit OT Treatments $Self Care/Home Management : 8-22 mins  Delight Stare, [place] Pager [phone] Office [phone]    Delight Stare 02/11/2019, 11:07 AM

## 2019-02-11 NOTE — Progress Notes (Signed)
   Subjective: [name] was examined examined at bedside this morning and reports that he feels better. He has been ambulating in the room without any difficulties. He was found sitting in the recliner by the window. He states the the scrotal swelling is greatly improved that he is feeling much less short of breath when walking.  He agrees to plan to continue to challenge his dry weight with persistent diuresis and reassess in the morning.  Objective:  Vital signs in last 24 hours: Vitals:   02/10/19 1609 02/10/19 1937 02/11/19 0100 02/11/19 0516  BP: 117/78 122/82  123/89  Pulse: 83 86  83  Resp: 18 18  18   Temp: 97.7 F (36.5 C) 98.3 F (36.8 C)  98.5 F (36.9 C)  TempSrc: Oral Oral  Oral  SpO2: 99% 100%  97%  Weight:   88.2 kg   Height:       General: A/O x4, in no acute distress, afebrile, nondiaphoretic Cardio: Regular rate and rhythm, there is an early systolic ejection murmur Pulmonary: CTA bilaterally, no wheezing or crackles  Abdomen: Bowel sounds normal, soft, nontender, anasarca resolved MSK: BLE nontender, +1 pitting edema BLE Psych: Appropriate affect, not depressed in appearance, engages well  Assessment/Plan:  Principal Problem:   Acute on chronic congestive heart failure (HCC) Active Problems:   Diabetes mellitus without complication (HCC)   CKD (chronic kidney disease), stage III ([place])   Acute kidney injury superimposed on CKD (HCC)  TroyNamba is 78 yo M w/ PMH of HFrEF (EF 20-25), DM, HLD, HTN, PVD, CKD, and BPH w/ indwelling foley presenting w/ shortness of breathand lower extremity edema 2/2 acute on chronic systolic heart failure exacerbation.Increased urinary output w/ higher dose of IV lasix. Renal function slowly improving. Weight trending down but will challenge dry weight given persistent edema on exam.   Dyspnea, Swelling 2/2 acute on chronic systolic heart failure exacerbation Net 3.4L output yesterday.TTE from [facility] 12/2018: EF 25-30%  biventricular heart failure. BNP>4500.K3.9this am.Current weight 96.4kg->95.7kg->93.8kg. Dry weight 91.5kg but will challenge this given his continued edema on exam. Good output over the last 24 hours with stable serum creatinine. 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 - Trend renal function - Avoid nephrotoxic meds - Hold home Entresto until discharge  BPH Hx of BPH w/ urinary retention. Per pt urology declined surgical intervention due to cardiac disease. On medical management only w/ chronic foley - C/w foley care - C/w home meds: tamsulosin 0.4mg  daily, finasteride 5mg  daily  DVT prophx:Lovenox Diet:Cardiac w/ fluid restrict Bowel:Senokot Code:Full Dispo: Anticipated discharge in approximately 1 day(s).   Kathi Ludwig, MD 02/11/2019, 6:49 AM Pager: Pager# [phone]

## 2019-02-12 LAB — BASIC METABOLIC PANEL
Anion gap: 9 (ref 5–15)
BUN: 46 mg/dL — ABNORMAL HIGH (ref 8–23)
CO2: 30 mmol/L (ref 22–32)
Calcium: 8.7 mg/dL — ABNORMAL LOW (ref 8.9–10.3)
Chloride: 97 mmol/L — ABNORMAL LOW (ref 98–111)
Creatinine, Ser: 2.23 mg/dL — ABNORMAL HIGH (ref 0.61–1.24)
GFR calc Af Amer: 32 mL/min — ABNORMAL LOW (ref >60)
GFR calc non Af Amer: 28 mL/min — ABNORMAL LOW (ref >60)
Glucose, Bld: 100 mg/dL — ABNORMAL HIGH (ref 70–99)
Potassium: 3.8 mmol/L (ref 3.5–5.1)
Sodium: 136 mmol/L (ref 135–145)

## 2019-02-12 MED ORDER — FUROSEMIDE 40 MG PO TABS: 120.0000 mg | ORAL_TABLET | Freq: Every day | ORAL | 0 refills | Status: DC

## 2019-02-12 MED ORDER — FUROSEMIDE 20 MG PO TABS: 20.0000 mg | ORAL_TABLET | Freq: Every day | ORAL | Status: DC

## 2019-02-12 MED ORDER — FUROSEMIDE 80 MG PO TABS
120.0000 mg | ORAL_TABLET | Freq: Every day | ORAL | Status: DC
  Administered 2019-02-12: 120 mg via ORAL
  Filled 2019-02-12: qty 1

## 2019-02-12 NOTE — Progress Notes (Signed)
  Date: 02/12/2019  Patient name: [name]  Medical record number: [id number]  Date of birth: 1941/03/30   I have seen and evaluated this patient and I have discussed the plan of care with Dr Truman Hayward. Please see his note for complete details. I concur with their findings with the following additions/corrections: [name] was seen this morning on team rounds.  He is now below his dry weight.  His creatinine bumped a little bit.  He is stable to be discharged on a higher diuretic dose.  He was advised to get a scale and weigh himself daily.  He will see cardiology in close follow-up.  Bartholomew Crews, MD 02/12/2019, 12:12 PM

## 2019-02-12 NOTE — Progress Notes (Signed)
   Subjective:  [name] is a 77 y.o. M with PMH of HFrEF (EF 20-25), DM, HLD, HTN, PVD, CKD and BPH on chronic foley admit for lower extremity swelling on hospital day 8  [name] was examined and evaluated at bedside chair this AM. He states he feels well this morning. Discussed his progress so far and plan for discharge today. He is unsure when his next cardiology appointment is. However, we advised him that it will be best to make a close cardiology follow up. He does not own a scale at home but reports that he will purchase one from [facility].   Objective:  Vital signs in last 24 hours: Vitals:   02/11/19 1433 02/11/19 1926 02/12/19 0338 02/12/19 0343  BP: 109/71 123/78  124/81  Pulse: 80 93  85  Resp: 18 18  18   Temp: 98.4 F (36.9 C) 98.4 F (36.9 C)  98.3 F (36.8 C)  TempSrc: Oral Oral  Oral  SpO2: 100% 100%  100%  Weight:   86.2 kg   Height:       Gen: Well-developed, well nourished, NAD HEENT: NCAT head, hearing intact, PERRL Neck: supple, ROM intact, no JVD CV: RRR, S1, S2 normal, No rubs, no murmurs, no gallops Pulm: CTAB, No rales, no wheezes Abd: Soft, BS+, NTND, No rebound, no guarding Extm: ROM intact, Peripheral pulses intact, Trace pitting edema around ankles and lower shin Skin: Dry, Warm, normal turgor Neuro: AAOx3  Assessment/Plan:  Principal Problem:   Acute on chronic congestive heart failure (HCC) Active Problems:   Diabetes mellitus without complication (HCC)   CKD (chronic kidney disease), stage III (HCC)   Acute kidney injury superimposed on CKD (HCC)  [name] is a 78 y.o. M with PMH of HFrEF (EF 20-25), DM, HLD, HTN, PVD, CKD and BPH on chronic foley admit on acute on chronic heart failure exacerbation. He no longer has significant lower extremity swelling and he is well below his last recorded dry weight. His creatinine is starting to increase slightly and will most likely be safe for discharge home today. Will need close  follow up with cardiology.  Dyspnea, Swelling 2/2 acute on chronic systolic heart failure exacerbation Appear close to euvolemic on exam. TTE from [facility] 12/2018: EF 25-30% biventricular heart failure. BNP>4500. K 3.8 this am.Current weight 86.2kg. 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 Per pt urology declined surgical intervention due to cardiac disease. On medical management only w/ chronic foley - C/w foley care - C/w home meds: tamsulosin 0.4mg  daily, finasteride 5mg  daily  DVT prophx:Lovenox Diet:Cardiac w/ fluid restrict Bowel:Senokot Code:Full  Dispo: Anticipated discharge in approximately today(s).   [name], MD 02/12/2019, 6:54 AM Pager: [phone]

## 2019-02-12 NOTE — Discharge Summary (Signed)
Name: [name] MRN: [id number] DOB: 09-08-1940 78 y.o. PCP: [name], MD  Date of Admission: 02/04/2019  8:54 AM Date of Discharge: 02/12/2019 Attending Physician: [name], MD  Discharge Diagnosis: 1. Acute on chronic systolic heart failure 2. Chronic Kidney Disease Stage 3b 3. 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 Notes to patient: 6/16   carvedilol 12.5 MG tablet Commonly known as: COREG Take 1 tablet (12.5 mg total) by mouth 2 (two) times daily. What changed: how much to take Notes to patient: 6/15   Entresto 24-26 MG Generic drug: sacubitril-valsartan Take 1 tablet by mouth 2 (two) times daily. Notes to patient: 6/15   finasteride 5 MG tablet Commonly known as: Proscar Take 1 tablet (5 mg total) by mouth daily. Notes to patient: 6/16   furosemide 40 MG tablet Commonly known as: LASIX Take 3 tablets (120 mg total) by mouth daily. What changed:   medication strength  how much to take Notes to patient: 6/16   multivitamin tablet Take 1 tablet by mouth daily. Notes to patient: 6/16   tamsulosin 0.4 MG Caps capsule Commonly known as: FLOMAX Take 1 capsule (0.4 mg total) by mouth daily. Notes to patient: 6/16            Durable Medical Equipment  (From admission, onward)         Start     Ordered   02/12/19 0951  For home use only DME Walker rolling  [facility])  Once    Comments: 5" wheels  Question:  Patient needs a walker to treat with the following condition  Answer:  HFrEF (heart failure with reduced ejection fraction) ([place])   02/12/19 0954   02/12/19 0951  DME 3-in-1  Once    Comments: Bedside Commode   02/12/19 0954          Disposition and follow-up:   Mr.[name] was discharged from [facility] in Stable condition.  At the hospital follow up visit please address:  1. Acute on chronic systolic heart failure - Discharge on furosemide 120mg  daily at weight of 86.2kg (190lbs) - Assess volume status and adjust dose as needed - Consider getting a BMP to assess stable electrolytes  2. Chronic Kidney Disease Stage 3b - Consider getting bmp to assess renal function  3. Benign Prostatic Hyperplasia with Chronic Foley Catheter - Ensure catheter is functioning well  2.  Labs / imaging needed at time of follow-up: BMP  3.  Pending labs/ test needing follow-up: N/A  Follow-up Appointments: Follow-up Information    [name], MD. Daphane Shepherd on 02/21/2019.   Specialty: Family Medicine Why: @9 :30am Contact information: [place] [address] [phone]        Jerline Pain, MD. Go on 02/20/2019.   Specialty: Cardiology Why: 8:30 AM Telehealth visit Contact information: [address] [address] [phone]        [facility] Follow up.   Why: Durable Medical Equipment: Theatre stage manager information: [place] [address] [phone]  [place] Follow up.   Specialty: Home Health Services Why: Physical and Occupational Therapy-office to call you with appointment for home visit.  Contact information: [place] [address] [phone]           Hospital Course by problem list: 1. Acute on chronic systolic heart failure: Mr.[name] is 78 yo M w/ PMH of HFrEF (EF 20-25), DM, HLD, HTN, PVD, CKD, and BPH w/ indwelling foley presenting to [facility] w/ lower extremity & scrotal edema and dyspnea. He was noted to have elevated BNP at >4500  with significant anasarca on exam with weight up to 96.4kg from previously known dry weight of 90kg.Marland Kitchen He described a diet heavy in sodium intake and was admitted for acute on chronic systolic heart failure due to dietary indiscretion. He was started on IV furosemide 40mg  BID with some urinary output (1-2L). His IV dose was titrated up to 60mg  TID with daily 3L output for 8 days of hospitalization. He was noted to have significant improvement in his swelling and dyspnea. He was diuresed until euvolemic status on exam with discharge weight of 86.2kg (190lbs) established as new dry weight. Discharged with recommendation to start furosemide PO 120mg  daily and follow up with heart failure clinic.  2. Chronic Kidney Disease Stage 3b: On admission noted to have acute kidney injury with creatinine of 2.38 from baseline of 1.5. Initially thought to be due to hypervolemia. Diuresed with improving renal function down to 2.1. Discharged with recommendation to f/u with PCP to trend renal function.  3. Benign Prostatic Hyperplasia with Chronic Foley Catheter: On admission noted to have discharge from foley catheter. UA showed no evidence of infection. Urine culture showed no growth. Renal ultrasound showed no evidence of hydronephrosis. Discharged with recommendation to f/u with urology.  Discharge Vitals:   BP 110/67   Pulse 86   Temp 98.3 F (36.8 C) (Oral)   Resp 18   Ht 5\' 11"  (1.803 m)   Wt 86.2 kg Comment: scale b  SpO2 100%   BMI 26.50 kg/m   Pertinent Labs, Studies, and Procedures:  BMP Latest Ref Rng & Units 02/12/2019 02/11/2019 02/10/2019  Glucose 70 - 99 mg/dL 100(H) 186(H) 130(H)  BUN 8 - 23 mg/dL 46(H) 45(H) 52(H)  Creatinine 0.61 - 1.24 mg/dL 2.23(H) 2.14(H) 2.09(H)  Sodium 135 - 145 mmol/L 136 137 137  Potassium 3.5 - 5.1 mmol/L 3.8 3.6 3.5  Chloride 98 - 111 mmol/L 97(L) 97(L) 101  CO2 22 - 32 mmol/L 30 31 27   Calcium 8.9 - 10.3 mg/dL 8.7(L) 8.8(L) 8.5(L)   RENAL / URINARY TRACT  ULTRASOUND COMPLETE FINDINGS: Right Kidney:  Renal measurements: 12.2 x 4.6 x 5.4 cm = volume: 157 mL . Echogenicity within normal limits. No mass or hydronephrosis visualized.  Left Kidney:  Renal measurements: 12.2 x 5.4 x 5.4 cm = volume: 184.6 mL. Echogenicity within normal limits. No mass or hydronephrosis visualized.  Bladder:  Foley catheter within the urinary bladder, which is not entirely decompressed. Bilateral ureteric jets visualized.  IMPRESSION: No acute process or explanation for acute renal injury.  PORTABLE CHEST 1 VIEW FINDINGS: There is cardiomegaly with pulmonary vascularity within normal limits. There is mild right base atelectasis. There is no edema or consolidation. No adenopathy. No bone lesions.  IMPRESSION: Cardiomegaly. Mild right base atelectasis. No frank edema or consolidation.  Discharge Instructions: Discharge Instructions    (HEART FAILURE PATIENTS) Call MD:  Anytime you have any of  the following symptoms: 1) 3 pound weight gain in 24 hours or 5 pounds in 1 week 2) shortness of breath, with or without a dry hacking cough 3) swelling in the hands, feet or stomach 4) if you have to sleep on extra pillows at night in order to breathe.   Complete by: As directed    Call MD for:  difficulty breathing, headache or visual disturbances   Complete by: As directed    Call MD for:  extreme fatigue   Complete by: As directed    Call MD for:  persistant dizziness or light-headedness   Complete by: As directed    Diet - low sodium heart healthy   Complete by: As directed    Discharge instructions   Complete by: As directed    Dear [name]  You came to [place] with lower leg swelling. We have determined this was caused by heart failure. Here are our recommendations for you at discharge:  Please start taking furosemide 120mg  daily Please make sure to avoid food high in salt Please take your other home medications as prescribed You  have a telehealth visit with your heart doctor on 02/20/19 at 8:30am. Expect a phone call at that time.  Thank you for choosing [facility].   Increase activity slowly   Complete by: As directed      Signed: [name], MD 02/12/2019, 2:31 PM   Pager: [phone]

## 2019-02-12 NOTE — Discharge Instructions (Signed)

## 2019-02-12 NOTE — TOC Transition Note (Signed)
Transition of Care [facility]) - CM/SW Discharge Note   Patient Details  Name: [name] MRN: [id number] Date of Birth: Feb 26, 1941  [facility]) CM/SW Contact:  Bethena Roys, RN Phone Number: 02/12/2019, 1:39 PM   Clinical Narrative:  Pt presented for Acute on Chronic CHF-PTA from home alone. CM did discuss [place] [facility]. Pt declines HH RN and wants HH PT/OT. Medicare.gov list provided to patient and plan will be to use [facility] from choice offered. Referral provided to [facility] and [facility] to begin within  24-48 hours post transition home. Patient has PCP and gets medications from [facility]. Patient has transportation home. No further needs from CM at this time.    Final next level of care: [place] Barriers to Discharge: No Barriers Identified   Patient Goals and CMS Choice Patient states their goals for this hospitalization and ongoing recovery are:: "to go home" CMS Medicare.gov Compare Post Acute Care list provided to:: Patient Choice offered to / list presented to : Patient  Discharge Placement                       Discharge Plan and Services In-house Referral: NA Discharge Planning Services: CM Consult Post Acute Care Choice: Home Health, Durable Medical Equipment          DME Arranged: 3-N-1, Walker rolling DME Agency: [employer]   Time DME Agency Contacted: [phone] Representative spoke with at DME Agency: [place]: PT, OT [place] Agency: [facility]   Time [place]: 1330 Representative spoke with at [place]: [place] (SDOH) Interventions     Readmission Risk Interventions No flowsheet data found.

## 2019-02-12 NOTE — Care Management Important Message (Signed)
Important Message  Patient Details  Name: [name] MRN: [id number] Date of Birth: 09-23-1940   Medicare Important Message Given:  Yes    Iris Montine Circle 02/12/2019, 3:52 PM

## 2019-02-12 NOTE — Care Management Important Message (Signed)
Important Message  Patient Details  Name: [name] MRN: [id number] Date of Birth: 07-01-1941   Medicare Important Message Given:  Yes    [name] 02/12/2019, 12:09 PM

## 2019-02-15 ENCOUNTER — Telehealth: Payer: Self-pay | Admitting: *Deleted

## 2019-02-15 NOTE — Telephone Encounter (Signed)
    COVID-19 Pre-Screening Questions:  . In the past 7 to 10 days have you had a cough,  shortness of breath, headache, congestion, fever (100 or greater) body aches, chills, sore throat, or sudden loss of taste or sense of smell? NO . Have you been around anyone with known Covid 19. NO . Have you been around anyone who is awaiting Covid 19 test results in the past 7 to 10 days? NO . Have you been around anyone who has been exposed to Covid 19, or has mentioned symptoms of Covid 19 within the past 7 to 10 days?NO  If you have any concerns/questions about symptoms patients report during screening (either on the phone or at [place]). Contact the provider seeing the patient or DOD for further guidance.  If neither are available contact a member of the leadership team.  Pt just got out of hospital and was tested for [place]. Negative.

## 2019-02-19 NOTE — Progress Notes (Deleted)
CARDIOLOGY OFFICE NOTE  Date:  02/19/2019    [name] Date of Birth: 1941-06-01 Medical Record #[id number]  PCP:  [name]., MD  Cardiologist:  [name] & [name]   No chief complaint on file.   History of Present Illness: [name] is a 78 y.o. male who presents today for a post hospital visit. Seen for Dr. Marlou Patton & [name].   He has a longstanding issues with alcohol, CRI, and HFrEF (EF 20 to 50%), chronic systolic dysfunction, DM, HLD, HTN, PAD, CKD, and BPH.  Heavy ETOH is felt to be a likely cause for cardiomyopathy.  Cath has previously been declined.    Seen last month by Dr. Rayann Patton for telehealth visit - had been started on Coreg and Entresto the week prior - a monitor had shown NSVT. His concern was more of BPH - had indwelling foley in place. Still with marked edema.   Admitted earlier this month -     The patient {does/does not:200015} have symptoms concerning for COVID-19 infection (fever, chills, cough, or new shortness of breath).   Comes in today. Here with   Past Medical History:  Diagnosis Date  . Acute cholecystitis 11/23/2017  . Acute respiratory failure with hypoxia ([place])   . Acute upper back pain   . AKI (acute kidney injury) ([place])   . Alcohol abuse 02/26/2015  . Anemia, deficiency   . Back pain    LUMBOSACRAL  . Bronchitis, complicated   . Cardiomegaly   . Chronic combined systolic and diastolic CHF (congestive heart failure) ([place])   . Chronic pain    HIGH RISK MED USE COMPREHENSIVE HIGH RISK  . CKD (chronic kidney disease), stage III ([place])   . Demand ischemia ([place])    a. minimally elevated troponin peak 0.11 in 2016, felt demand ischemia. Cath offered but pt wished to have done back in [place].  . Diabetes mellitus without complication ([place])   . Edema, leg   . Elevated troponin 02/26/2015  . Essential hypertension   . Fatigue   . Heart abnormality   . Hyperlipidemia   . Hypertension   . Neck pain, acute   . PVD  (peripheral vascular disease) ([place])   . Renal lesion 11/23/2017  . SOB (shortness of breath) on exertion   . Solitary kidney   . Vitamin D deficiency     Past Surgical History:  Procedure Laterality Date  . IR CHOLANGIOGRAM EXISTING TUBE  12/27/2017  . IR PERC CHOLECYSTOSTOMY  11/25/2017  . IR RADIOLOGIST EVAL & MGMT  02/02/2018     Medications: No outpatient medications have been marked as taking for the 02/20/19 encounter (Appointment) with Burtis Junes, NP.     Allergies: No Known Allergies  Social History: The patient  reports that he quit smoking about 10 years ago. His smoking use included cigarettes. He has never used smokeless tobacco. He reports current alcohol use. He reports that he does not use drugs.   Family History: The patient's ***family history includes Diabetes Mellitus II in his sister; Heart disease in his sister; Hyperlipidemia in his sister; Hypertension in his sister; Other in his mother.   Review of Systems: Please see the history of present illness.   All other systems are reviewed and negative.   Physical Exam: VS:  There were no vitals taken for this visit. [name]  BMI There is no height or weight on file to calculate BMI.  Wt Readings from Last 3 Encounters:  02/12/19  190 lb (86.2 kg)  09/13/18 202 lb 13.2 oz (92 kg)  08/16/18 203 lb (92.1 kg)    General: Pleasant. Well developed, well nourished and in no acute distress.   HEENT: Normal.  Neck: Supple, no JVD, carotid bruits, or masses noted.  Cardiac: ***Regular rate and rhythm. No murmurs, rubs, or gallops. No edema.  Respiratory:  Lungs are clear to auscultation bilaterally with normal work of breathing.  GI: Soft and nontender.  MS: No deformity or atrophy. Gait and ROM intact.  Skin: Warm and dry. Color is normal.  Neuro:  Strength and sensation are intact and no gross focal deficits noted.  Psych: Alert, appropriate and with normal affect.   LABORATORY DATA:  EKG:  EKG {ACTION; IS/IS  XIP:38250539} ordered today. This demonstrates ***.  Lab Results  Component Value Date   WBC 5.8 02/04/2019   HGB 12.3 (L) 02/04/2019   HCT 39.3 02/04/2019   PLT 264 02/04/2019   GLUCOSE 100 (H) 02/12/2019   CHOL 109 02/05/2019   TRIG 58 02/05/2019   HDL 32 (L) 02/05/2019   LDLCALC 65 02/05/2019   ALT 14 02/04/2019   AST 26 02/04/2019   NA 136 02/12/2019   K 3.8 02/12/2019   CL 97 (L) 02/12/2019   CREATININE 2.23 (H) 02/12/2019   BUN 46 (H) 02/12/2019   CO2 30 02/12/2019   TSH 1.106 02/26/2015   INR 1.27 11/23/2017   HGBA1C 5.6 02/26/2015     BNP (last 3 results) Recent Labs    08/16/18 0940 02/04/19 0906  BNP 1,774.3* >4,500.0*    ProBNP (last 3 results) No results for input(s): PROBNP in the last 8760 hours.   Other Studies Reviewed Today:   Assessment/Plan:  1. 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 NSVT  3. Heavy alcohol abuse  4. HTN  5. HLD  6. BPH/indwelling foley - noted declined urology intervention  7. CKD - worse with recent diuresis  8. COVID-19 Education: The signs and symptoms of COVID-19 were discussed with the patient and how to seek care for testing (follow up with PCP or arrange E-visit).  The importance of social distancing, staying at home, hand hygiene and wearing a mask when out in public were discussed today.  Current medicines are reviewed with the patient today.  The patient does not have concerns regarding medicines other than what has been noted above.  The following changes have been made:  See above.  Labs/ tests ordered today include:   No orders of the defined types were placed in this encounter.    Disposition:   FU with *** in {gen number 7-67:341937} {Days to years:10300}.   Patient is agreeable to this plan and will call if any problems develop in the  interim.   SignedTruitt Merle, NP  02/19/2019 7:56 AM  [facility] [address] [place], [place]  [address] Phone: [phone] Fax: [phone]

## 2019-02-20 ENCOUNTER — Ambulatory Visit: Payer: Medicare Other | Admitting: Nurse Practitioner

## 2019-06-18 ENCOUNTER — Emergency Department (HOSPITAL_COMMUNITY): Payer: Medicare Other

## 2019-06-18 ENCOUNTER — Other Ambulatory Visit: Payer: Self-pay

## 2019-06-18 ENCOUNTER — Inpatient Hospital Stay (HOSPITAL_COMMUNITY)
Admission: EM | Admit: 2019-06-18 | Discharge: 2019-07-09 | DRG: 444 | Disposition: A | Discharge status: Home Health | Payer: Medicare Other | Attending: Internal Medicine | Admitting: Internal Medicine

## 2019-06-18 ENCOUNTER — Encounter (HOSPITAL_COMMUNITY): Payer: Self-pay | Admitting: *Deleted

## 2019-06-18 DIAGNOSIS — Z960 Presence of urogenital implants: Secondary | ICD-10-CM | POA: Diagnosis present

## 2019-06-18 DIAGNOSIS — N4889 Other specified disorders of penis: Secondary | ICD-10-CM | POA: Diagnosis present

## 2019-06-18 DIAGNOSIS — K429 Umbilical hernia without obstruction or gangrene: Secondary | ICD-10-CM | POA: Diagnosis present

## 2019-06-18 DIAGNOSIS — K8050 Calculus of bile duct without cholangitis or cholecystitis without obstruction: Secondary | ICD-10-CM

## 2019-06-18 DIAGNOSIS — I513 Intracardiac thrombosis, not elsewhere classified: Secondary | ICD-10-CM | POA: Diagnosis present

## 2019-06-18 DIAGNOSIS — E873 Alkalosis: Secondary | ICD-10-CM | POA: Diagnosis not present

## 2019-06-18 DIAGNOSIS — K209 Esophagitis, unspecified without bleeding: Secondary | ICD-10-CM | POA: Diagnosis present

## 2019-06-18 DIAGNOSIS — I313 Pericardial effusion (noninflammatory): Secondary | ICD-10-CM | POA: Diagnosis present

## 2019-06-18 DIAGNOSIS — Z8349 Family history of other endocrine, nutritional and metabolic diseases: Secondary | ICD-10-CM

## 2019-06-18 DIAGNOSIS — K8071 Calculus of gallbladder and bile duct without cholecystitis with obstruction: Secondary | ICD-10-CM | POA: Diagnosis not present

## 2019-06-18 DIAGNOSIS — Z6824 Body mass index (BMI) 24.0-24.9, adult: Secondary | ICD-10-CM

## 2019-06-18 DIAGNOSIS — Z515 Encounter for palliative care: Secondary | ICD-10-CM

## 2019-06-18 DIAGNOSIS — E1151 Type 2 diabetes mellitus with diabetic peripheral angiopathy without gangrene: Secondary | ICD-10-CM | POA: Diagnosis present

## 2019-06-18 DIAGNOSIS — R188 Other ascites: Secondary | ICD-10-CM | POA: Diagnosis present

## 2019-06-18 DIAGNOSIS — I340 Nonrheumatic mitral (valve) insufficiency: Secondary | ICD-10-CM | POA: Diagnosis present

## 2019-06-18 DIAGNOSIS — K7689 Other specified diseases of liver: Secondary | ICD-10-CM | POA: Diagnosis present

## 2019-06-18 DIAGNOSIS — N390 Urinary tract infection, site not specified: Secondary | ICD-10-CM | POA: Diagnosis present

## 2019-06-18 DIAGNOSIS — E46 Unspecified protein-calorie malnutrition: Secondary | ICD-10-CM | POA: Diagnosis not present

## 2019-06-18 DIAGNOSIS — Z20828 Contact with and (suspected) exposure to other viral communicable diseases: Secondary | ICD-10-CM | POA: Diagnosis present

## 2019-06-18 DIAGNOSIS — Z66 Do not resuscitate: Secondary | ICD-10-CM | POA: Diagnosis not present

## 2019-06-18 DIAGNOSIS — K8031 Calculus of bile duct with cholangitis, unspecified, with obstruction: Secondary | ICD-10-CM

## 2019-06-18 DIAGNOSIS — K5900 Constipation, unspecified: Secondary | ICD-10-CM | POA: Diagnosis not present

## 2019-06-18 DIAGNOSIS — R17 Unspecified jaundice: Secondary | ICD-10-CM

## 2019-06-18 DIAGNOSIS — F1010 Alcohol abuse, uncomplicated: Secondary | ICD-10-CM | POA: Diagnosis present

## 2019-06-18 DIAGNOSIS — E876 Hypokalemia: Secondary | ICD-10-CM | POA: Diagnosis not present

## 2019-06-18 DIAGNOSIS — B961 Klebsiella pneumoniae [K. pneumoniae] as the cause of diseases classified elsewhere: Secondary | ICD-10-CM | POA: Diagnosis present

## 2019-06-18 DIAGNOSIS — R140 Abdominal distension (gaseous): Secondary | ICD-10-CM

## 2019-06-18 DIAGNOSIS — K259 Gastric ulcer, unspecified as acute or chronic, without hemorrhage or perforation: Secondary | ICD-10-CM | POA: Diagnosis present

## 2019-06-18 DIAGNOSIS — I429 Cardiomyopathy, unspecified: Secondary | ICD-10-CM | POA: Diagnosis present

## 2019-06-18 DIAGNOSIS — R197 Diarrhea, unspecified: Secondary | ICD-10-CM | POA: Diagnosis not present

## 2019-06-18 DIAGNOSIS — E1122 Type 2 diabetes mellitus with diabetic chronic kidney disease: Secondary | ICD-10-CM | POA: Diagnosis present

## 2019-06-18 DIAGNOSIS — I5084 End stage heart failure: Secondary | ICD-10-CM | POA: Diagnosis present

## 2019-06-18 DIAGNOSIS — E119 Type 2 diabetes mellitus without complications: Secondary | ICD-10-CM

## 2019-06-18 DIAGNOSIS — I130 Hypertensive heart and chronic kidney disease with heart failure and stage 1 through stage 4 chronic kidney disease, or unspecified chronic kidney disease: Secondary | ICD-10-CM | POA: Diagnosis present

## 2019-06-18 DIAGNOSIS — Z833 Family history of diabetes mellitus: Secondary | ICD-10-CM

## 2019-06-18 DIAGNOSIS — Z7982 Long term (current) use of aspirin: Secondary | ICD-10-CM

## 2019-06-18 DIAGNOSIS — D62 Acute posthemorrhagic anemia: Secondary | ICD-10-CM | POA: Diagnosis not present

## 2019-06-18 DIAGNOSIS — I5082 Biventricular heart failure: Secondary | ICD-10-CM | POA: Diagnosis present

## 2019-06-18 DIAGNOSIS — N401 Enlarged prostate with lower urinary tract symptoms: Secondary | ICD-10-CM | POA: Diagnosis present

## 2019-06-18 DIAGNOSIS — K922 Gastrointestinal hemorrhage, unspecified: Secondary | ICD-10-CM

## 2019-06-18 DIAGNOSIS — K0889 Other specified disorders of teeth and supporting structures: Secondary | ICD-10-CM | POA: Diagnosis present

## 2019-06-18 DIAGNOSIS — I472 Ventricular tachycardia: Secondary | ICD-10-CM | POA: Diagnosis present

## 2019-06-18 DIAGNOSIS — K819 Cholecystitis, unspecified: Secondary | ICD-10-CM

## 2019-06-18 DIAGNOSIS — I5043 Acute on chronic combined systolic (congestive) and diastolic (congestive) heart failure: Secondary | ICD-10-CM

## 2019-06-18 DIAGNOSIS — K921 Melena: Secondary | ICD-10-CM | POA: Diagnosis not present

## 2019-06-18 DIAGNOSIS — K3189 Other diseases of stomach and duodenum: Secondary | ICD-10-CM | POA: Diagnosis present

## 2019-06-18 DIAGNOSIS — N170 Acute kidney failure with tubular necrosis: Secondary | ICD-10-CM | POA: Diagnosis not present

## 2019-06-18 DIAGNOSIS — H269 Unspecified cataract: Secondary | ICD-10-CM | POA: Diagnosis present

## 2019-06-18 DIAGNOSIS — N138 Other obstructive and reflux uropathy: Secondary | ICD-10-CM | POA: Diagnosis present

## 2019-06-18 DIAGNOSIS — R1033 Periumbilical pain: Secondary | ICD-10-CM

## 2019-06-18 DIAGNOSIS — D1809 Hemangioma of other sites: Secondary | ICD-10-CM | POA: Diagnosis present

## 2019-06-18 DIAGNOSIS — I10 Essential (primary) hypertension: Secondary | ICD-10-CM | POA: Diagnosis present

## 2019-06-18 DIAGNOSIS — R338 Other retention of urine: Secondary | ICD-10-CM | POA: Diagnosis present

## 2019-06-18 DIAGNOSIS — Z79899 Other long term (current) drug therapy: Secondary | ICD-10-CM

## 2019-06-18 DIAGNOSIS — N183 Chronic kidney disease, stage 3 unspecified: Secondary | ICD-10-CM | POA: Diagnosis present

## 2019-06-18 DIAGNOSIS — G8929 Other chronic pain: Secondary | ICD-10-CM | POA: Diagnosis present

## 2019-06-18 DIAGNOSIS — K8309 Other cholangitis: Secondary | ICD-10-CM | POA: Diagnosis present

## 2019-06-18 DIAGNOSIS — K7460 Unspecified cirrhosis of liver: Secondary | ICD-10-CM | POA: Diagnosis present

## 2019-06-18 DIAGNOSIS — N184 Chronic kidney disease, stage 4 (severe): Secondary | ICD-10-CM | POA: Diagnosis present

## 2019-06-18 DIAGNOSIS — I444 Left anterior fascicular block: Secondary | ICD-10-CM | POA: Diagnosis present

## 2019-06-18 DIAGNOSIS — E785 Hyperlipidemia, unspecified: Secondary | ICD-10-CM | POA: Diagnosis present

## 2019-06-18 DIAGNOSIS — R570 Cardiogenic shock: Secondary | ICD-10-CM

## 2019-06-18 DIAGNOSIS — E1136 Type 2 diabetes mellitus with diabetic cataract: Secondary | ICD-10-CM | POA: Diagnosis present

## 2019-06-18 DIAGNOSIS — R1310 Dysphagia, unspecified: Secondary | ICD-10-CM

## 2019-06-18 DIAGNOSIS — N5089 Other specified disorders of the male genital organs: Secondary | ICD-10-CM | POA: Diagnosis present

## 2019-06-18 DIAGNOSIS — Z87891 Personal history of nicotine dependence: Secondary | ICD-10-CM

## 2019-06-18 DIAGNOSIS — K449 Diaphragmatic hernia without obstruction or gangrene: Secondary | ICD-10-CM | POA: Diagnosis present

## 2019-06-18 DIAGNOSIS — N179 Acute kidney failure, unspecified: Secondary | ICD-10-CM

## 2019-06-18 DIAGNOSIS — K219 Gastro-esophageal reflux disease without esophagitis: Secondary | ICD-10-CM | POA: Diagnosis present

## 2019-06-18 DIAGNOSIS — D509 Iron deficiency anemia, unspecified: Secondary | ICD-10-CM | POA: Diagnosis present

## 2019-06-18 DIAGNOSIS — K5730 Diverticulosis of large intestine without perforation or abscess without bleeding: Secondary | ICD-10-CM | POA: Diagnosis present

## 2019-06-18 DIAGNOSIS — Z8249 Family history of ischemic heart disease and other diseases of the circulatory system: Secondary | ICD-10-CM

## 2019-06-18 DIAGNOSIS — I959 Hypotension, unspecified: Secondary | ICD-10-CM | POA: Diagnosis present

## 2019-06-18 DIAGNOSIS — N1832 Chronic kidney disease, stage 3b: Secondary | ICD-10-CM | POA: Diagnosis present

## 2019-06-18 DIAGNOSIS — R109 Unspecified abdominal pain: Secondary | ICD-10-CM | POA: Diagnosis present

## 2019-06-18 DIAGNOSIS — K567 Ileus, unspecified: Secondary | ICD-10-CM | POA: Diagnosis not present

## 2019-06-18 HISTORY — DX: Cardiogenic shock: R57.0

## 2019-06-18 HISTORY — DX: Calculus of bile duct with cholangitis, unspecified, with obstruction: K80.31

## 2019-06-18 LAB — CBC
HCT: 36.3 % — ABNORMAL LOW (ref 39.0–52.0)
Hemoglobin: 12.1 g/dL — ABNORMAL LOW (ref 13.0–17.0)
MCH: 29.4 pg (ref 26.0–34.0)
MCHC: 33.3 g/dL (ref 30.0–36.0)
MCV: 88.3 fL (ref 80.0–100.0)
Platelets: 226 10*3/uL (ref 150–400)
RBC: 4.11 MIL/uL — ABNORMAL LOW (ref 4.22–5.81)
RDW: 17.1 % — ABNORMAL HIGH (ref 11.5–15.5)
WBC: 9.9 10*3/uL (ref 4.0–10.5)
nRBC: 0 % (ref 0.0–0.2)

## 2019-06-18 LAB — URINALYSIS, ROUTINE W REFLEX MICROSCOPIC
Glucose, UA: NEGATIVE mg/dL
Ketones, ur: NEGATIVE mg/dL
Nitrite: NEGATIVE
Protein, ur: 100 mg/dL — AB
Specific Gravity, Urine: 1.017 (ref 1.005–1.030)
WBC, UA: >50 WBC/hpf — ABNORMAL HIGH (ref 0–5)
pH: 5 (ref 5.0–8.0)

## 2019-06-18 LAB — COMPREHENSIVE METABOLIC PANEL
ALT: 37 U/L (ref 0–44)
AST: 31 U/L (ref 15–41)
Albumin: 2.5 g/dL — ABNORMAL LOW (ref 3.5–5.0)
Alkaline Phosphatase: 89 U/L (ref 38–126)
Anion gap: 15 (ref 5–15)
BUN: 69 mg/dL — ABNORMAL HIGH (ref 8–23)
CO2: 20 mmol/L — ABNORMAL LOW (ref 22–32)
Calcium: 8.5 mg/dL — ABNORMAL LOW (ref 8.9–10.3)
Chloride: 98 mmol/L (ref 98–111)
Creatinine, Ser: 3.09 mg/dL — ABNORMAL HIGH (ref 0.61–1.24)
GFR calc Af Amer: 22 mL/min — ABNORMAL LOW (ref >60)
GFR calc non Af Amer: 19 mL/min — ABNORMAL LOW (ref >60)
Glucose, Bld: 127 mg/dL — ABNORMAL HIGH (ref 70–99)
Potassium: 4.1 mmol/L (ref 3.5–5.1)
Sodium: 133 mmol/L — ABNORMAL LOW (ref 135–145)
Total Bilirubin: 13.4 mg/dL — ABNORMAL HIGH (ref 0.3–1.2)
Total Protein: 7.7 g/dL (ref 6.5–8.1)

## 2019-06-18 LAB — LIPASE, BLOOD: Lipase: 220 U/L — ABNORMAL HIGH (ref 11–51)

## 2019-06-18 MED ORDER — HYDROMORPHONE HCL 1 MG/ML IJ SOLN
0.5000 mg | Freq: Once | INTRAMUSCULAR | Status: AC
  Administered 2019-06-18: 0.5 mg via INTRAVENOUS
  Filled 2019-06-18: qty 1

## 2019-06-18 MED ORDER — SODIUM CHLORIDE 0.9% FLUSH
3.0000 mL | Freq: Once | INTRAVENOUS | Status: AC
  Administered 2019-07-02: 3 mL via INTRAVENOUS

## 2019-06-18 MED ORDER — PIPERACILLIN-TAZOBACTAM 3.375 G IVPB 30 MIN
3.3750 g | Freq: Once | INTRAVENOUS | Status: AC
  Administered 2019-06-18: 3.375 g via INTRAVENOUS
  Filled 2019-06-18: qty 50

## 2019-06-18 NOTE — ED Provider Notes (Signed)
[facility] EMERGENCY DEPARTMENT Provider Note   CSN: [id number] Arrival date & time: 06/18/19  2042     History   Chief Complaint Chief Complaint  Patient presents with   Abdominal Pain    HPI [name] is a 78 y.o. male.     Patient with history of CKD, CHF (combined), CM, DM HTN, HLD previous cholecystitis (10/2017) treated by percutaneous cholecystostomy drain (placed by IR). He has had abdominal pain across the mid-abdomen for the past 2 days without nausea, vomiting. "Feels the same as last year". He reports significant constipation with last BM last week. Pain became severe today and he became jaundiced. He reports having a fever on Day 1 but none since. No chest pain. He reports his breathing is affected by the pain in his abdomen. Denies cough, congestion. He states he has been more swollen recently, including his groin.   The history is provided by the patient. No language interpreter was used.  Abdominal Pain Associated symptoms: constipation, fever (2 days ago, none since) and shortness of breath   Associated symptoms: no chest pain, no nausea and no vomiting     Past Medical History:  Diagnosis Date   Acute cholecystitis 11/23/2017   Acute respiratory failure with hypoxia (HCC)    Acute upper back pain    AKI (acute kidney injury) ([place])    Alcohol abuse 02/26/2015   Anemia, deficiency    Back pain    LUMBOSACRAL   Bronchitis, complicated    Cardiomegaly    Chronic combined systolic and diastolic CHF (congestive heart failure) (HCC)    Chronic pain    HIGH RISK MED USE COMPREHENSIVE HIGH RISK   CKD (chronic kidney disease), stage III    Demand ischemia ([place])    a. minimally elevated troponin peak 0.11 in 2016, felt demand ischemia. Cath offered but pt wished to have done back in [place].   Diabetes mellitus without complication (HCC)    Edema, leg    Elevated troponin 02/26/2015   Essential hypertension    Fatigue     Heart abnormality    Hyperlipidemia    Hypertension    Neck pain, acute    PVD (peripheral vascular disease) (HCC)    Renal lesion 11/23/2017   SOB (shortness of breath) on exertion    Solitary kidney    Vitamin D deficiency     Patient Active Problem List   Diagnosis Date Noted   Acute kidney injury superimposed on CKD ([place]) 02/05/2019   Acute on chronic congestive heart failure ([place]) 02/04/2019   Anemia of chronic disease 03/08/2018   Acute cholecystitis 11/23/2017   Renal lesion 11/23/2017   Alcohol abuse 02/26/2015   Hyperlipidemia    Diabetes mellitus without complication (HCC)    CKD (chronic kidney disease), stage III (HCC)    Chronic combined systolic and diastolic heart failure ([place])    Essential hypertension     Past Surgical History:  Procedure Laterality Date   IR CHOLANGIOGRAM EXISTING TUBE  12/27/2017   IR PERC CHOLECYSTOSTOMY  11/25/2017   IR RADIOLOGIST EVAL & MGMT  02/02/2018        Home Medications    Prior to Admission medications   Medication Sig Start Date End Date Taking? Authorizing Provider  aspirin EC 81 MG tablet Take 81 mg by mouth daily.    [provider]  carvedilol (COREG) 12.5 MG tablet Take 1 tablet (12.5 mg total) by mouth 2 (two) times daily. Patient taking differently:  Take 6.25 mg by mouth 2 (two) times daily.  03/08/18 02/04/19  Daune Perch, NP  ENTRESTO 24-26 MG Take 1 tablet by mouth 2 (two) times daily. 01/16/19   [provider]  finasteride (PROSCAR) 5 MG tablet Take 1 tablet (5 mg total) by mouth daily. 09/03/18   Fawze, Mina A, PA-C  furosemide (LASIX) 40 MG tablet Take 3 tablets (120 mg total) by mouth daily. 02/12/19   [name], MD  Multiple Vitamin (MULTIVITAMIN) tablet Take 1 tablet by mouth daily.    [provider]  tamsulosin (FLOMAX) 0.4 MG CAPS capsule Take 1 capsule (0.4 mg total) by mouth daily. 09/03/18   Renita Papa, PA-C    Family History Family History  Problem  Relation Age of Onset   Other Mother        died in her sleep at 21   Hypertension Sister    Hyperlipidemia Sister    Diabetes Mellitus II Sister    Heart disease Sister    CAD Neg Hx        neg hx premature CAD    Social History Social History   Tobacco Use   Smoking status: Former Smoker    Types: Cigarettes    Quit date: 02/26/2008    Years since quitting: 11.3   Smokeless tobacco: Never Used  Substance Use Topics   Alcohol use: Yes    Comment: occ   Drug use: No     Allergies   Patient has no known allergies.   Review of Systems Review of Systems  Constitutional: Positive for fever (2 days ago, none since).  HENT: Negative.   Eyes:       Icterus   Respiratory: Positive for shortness of breath.   Cardiovascular: Positive for leg swelling. Negative for chest pain.  Gastrointestinal: Positive for abdominal pain and constipation. Negative for nausea and vomiting.  Genitourinary: Positive for penile swelling and scrotal swelling. Negative for difficulty urinating.  Musculoskeletal: Negative.   Skin: Positive for color change.  Neurological: Negative for weakness and light-headedness.  Psychiatric/Behavioral: Negative for confusion.     Physical Exam Updated Vital Signs BP 120/75    Pulse 100    Temp (!) 97.1 F (36.2 C) (Oral)    Resp (!) 24    SpO2 100%   Physical Exam Vitals signs and nursing note reviewed.  Constitutional:      Appearance: He is well-developed.  HENT:     Head: Normocephalic.  Eyes:     General: Scleral icterus present.  Neck:     Musculoskeletal: Normal range of motion and neck supple.  Cardiovascular:     Rate and Rhythm: Normal rate and regular rhythm.  Pulmonary:     Effort: Pulmonary effort is normal.     Breath sounds: Normal breath sounds.  Abdominal:     General: Bowel sounds are absent.     Palpations: Abdomen is soft.     Tenderness: There is abdominal tenderness. There is guarding. There is no rebound.    Genitourinary:    Comments: Penile and scrotal swelling without tenderness, c/w anasarca Musculoskeletal: Normal range of motion.     Right lower leg: Edema present.     Left lower leg: Edema present.  Skin:    General: Skin is warm and dry.     Findings: No rash.  Neurological:     Mental Status: He is alert and oriented to person, place, and time.      ED Treatments /  Results  Labs (all labs ordered are listed, but only abnormal results are displayed) Labs Reviewed  LIPASE, BLOOD - Abnormal; Notable for the following components:      Result Value   Lipase 220 (*)    All other components within normal limits  COMPREHENSIVE METABOLIC PANEL - Abnormal; Notable for the following components:   Sodium 133 (*)    CO2 20 (*)    Glucose, Bld 127 (*)    BUN 69 (*)    Creatinine, Ser 3.09 (*)    Calcium 8.5 (*)    Albumin 2.5 (*)    Total Bilirubin 13.4 (*)    GFR calc non Af Amer 19 (*)    GFR calc Af Amer 22 (*)    All other components within normal limits  CBC - Abnormal; Notable for the following components:   RBC 4.11 (*)    Hemoglobin 12.1 (*)    HCT 36.3 (*)    RDW 17.1 (*)    All other components within normal limits  URINALYSIS, ROUTINE W REFLEX MICROSCOPIC    EKG None  Radiology Dg Chest 2 View  Result Date: 06/18/2019 CLINICAL DATA:  Shortness of breath EXAM: CHEST - 2 VIEW COMPARISON:  02/04/2019 FINDINGS: Small right-sided pleural effusion. Cardiomegaly with vascular congestion. Hazy atelectasis at the bases. No pneumothorax. IMPRESSION: 1. Cardiomegaly with vascular congestion 2. Small right-sided pleural effusion and hazy atelectasis at the right base. Electronically Signed   By: Donavan Foil M.D.   On: 06/18/2019 21:37    Procedures Procedures (including critical care time)  Medications Ordered in ED Medications  sodium chloride flush (NS) 0.9 % injection 3 mL (has no administration in time range)     Initial Impression / Assessment and Plan /  ED Course  I have reviewed the triage vital signs and the nursing notes.  Pertinent labs & imaging results that were available during my care of the patient were reviewed by me and considered in my medical decision making (see chart for details).        Patient to ED with abdominal pain x 2 days. Fever on Day 1 none since. No N, V.   Patient is uncomfortable appearing. Scleral icterus present. History of cholecystitis treated by percutaneous cholecystostomy (10/2017) over cholecystectomy after cardiology recommendation. Per chart review, the drain was removed in June of last year by patient request noting a 20% likelihood of recurrence. He has been asymptomatic since. Consider ascending cholangitis given presentation and history. Zosyn started. Covid test pending.  Labs today significant for total bili of over 13, Cr 3.09 (baseline seems to be 2.2), BUN 69, BNP 2536, positive for UTI, no leukocytosis. US abdomen shows compressed gall bladder with stones, wall thickening.   Discussed the patient on curbside consult with Dr. Bobbye Morton (gen surg). Given history of significant cardiac condition that essentially prevented cholecystectomy last year, she recommends first intervention be IR re-insertion of perc cholescysostomy tube, but that she would be available if surgery is to be consider after cardiology clearance.   On re-evaluation, the patient is more comfortable. Rectal temp 99.8. Tachycardia down from 118 to 101. Hemodynamically stable. Discussed admission with Dr. Jani Gravel, [facility], who accepts the patient for admission.   Final Clinical Impressions(s) / ED Diagnoses   Final diagnoses:  None   1. Hyperbilirubinemia 2. Abdominal pain 3. AKI 4. Vincent   ED Discharge Orders    None       Charlann Lange, [place] 06/19/19 0215  Merryl Hacker, MD 06/19/19 [id number]

## 2019-06-18 NOTE — ED Triage Notes (Signed)
Pt c/o abd pain x 2 days with constipation and decreased appetite. EMS reports taking apple cider vinegar with worsened pain. Also noted jaundice to eyes with family reported to EMS was new. Hx of CHF, took extra lasix on Saturday with increased urinary output; BLE edema noted. EMS placed pt on oxygen for comfort.   Theador Hawthorne, niece, to be contacted [phone]

## 2019-06-19 ENCOUNTER — Encounter (HOSPITAL_COMMUNITY)
Admission: EM | Disposition: A | Discharge status: Home Health | Payer: Self-pay | Source: Home / Self Care | Attending: Family Medicine

## 2019-06-19 ENCOUNTER — Inpatient Hospital Stay (HOSPITAL_COMMUNITY): Payer: Medicare Other | Admitting: Certified Registered"

## 2019-06-19 ENCOUNTER — Encounter (HOSPITAL_COMMUNITY): Payer: Self-pay | Admitting: Emergency Medicine

## 2019-06-19 ENCOUNTER — Inpatient Hospital Stay (HOSPITAL_COMMUNITY): Payer: Medicare Other

## 2019-06-19 DIAGNOSIS — I5082 Biventricular heart failure: Secondary | ICD-10-CM | POA: Diagnosis present

## 2019-06-19 DIAGNOSIS — K8071 Calculus of gallbladder and bile duct without cholecystitis with obstruction: Secondary | ICD-10-CM | POA: Diagnosis present

## 2019-06-19 DIAGNOSIS — N390 Urinary tract infection, site not specified: Secondary | ICD-10-CM | POA: Diagnosis present

## 2019-06-19 DIAGNOSIS — N1832 Chronic kidney disease, stage 3b: Secondary | ICD-10-CM | POA: Diagnosis not present

## 2019-06-19 DIAGNOSIS — R945 Abnormal results of liver function studies: Secondary | ICD-10-CM | POA: Diagnosis not present

## 2019-06-19 DIAGNOSIS — I10 Essential (primary) hypertension: Secondary | ICD-10-CM

## 2019-06-19 DIAGNOSIS — Z9889 Other specified postprocedural states: Secondary | ICD-10-CM | POA: Diagnosis not present

## 2019-06-19 DIAGNOSIS — R195 Other fecal abnormalities: Secondary | ICD-10-CM | POA: Diagnosis not present

## 2019-06-19 DIAGNOSIS — K259 Gastric ulcer, unspecified as acute or chronic, without hemorrhage or perforation: Secondary | ICD-10-CM | POA: Diagnosis not present

## 2019-06-19 DIAGNOSIS — I340 Nonrheumatic mitral (valve) insufficiency: Secondary | ICD-10-CM | POA: Diagnosis not present

## 2019-06-19 DIAGNOSIS — K921 Melena: Secondary | ICD-10-CM | POA: Diagnosis not present

## 2019-06-19 DIAGNOSIS — I429 Cardiomyopathy, unspecified: Secondary | ICD-10-CM | POA: Diagnosis present

## 2019-06-19 DIAGNOSIS — E782 Mixed hyperlipidemia: Secondary | ICD-10-CM

## 2019-06-19 DIAGNOSIS — I5084 End stage heart failure: Secondary | ICD-10-CM | POA: Diagnosis present

## 2019-06-19 DIAGNOSIS — E1136 Type 2 diabetes mellitus with diabetic cataract: Secondary | ICD-10-CM | POA: Diagnosis present

## 2019-06-19 DIAGNOSIS — I513 Intracardiac thrombosis, not elsewhere classified: Secondary | ICD-10-CM | POA: Diagnosis present

## 2019-06-19 DIAGNOSIS — I313 Pericardial effusion (noninflammatory): Secondary | ICD-10-CM | POA: Diagnosis present

## 2019-06-19 DIAGNOSIS — R140 Abdominal distension (gaseous): Secondary | ICD-10-CM | POA: Diagnosis not present

## 2019-06-19 DIAGNOSIS — I5023 Acute on chronic systolic (congestive) heart failure: Secondary | ICD-10-CM | POA: Diagnosis not present

## 2019-06-19 DIAGNOSIS — I5042 Chronic combined systolic (congestive) and diastolic (congestive) heart failure: Secondary | ICD-10-CM | POA: Diagnosis not present

## 2019-06-19 DIAGNOSIS — R1011 Right upper quadrant pain: Secondary | ICD-10-CM

## 2019-06-19 DIAGNOSIS — K8050 Calculus of bile duct without cholangitis or cholecystitis without obstruction: Secondary | ICD-10-CM

## 2019-06-19 DIAGNOSIS — R109 Unspecified abdominal pain: Secondary | ICD-10-CM | POA: Diagnosis not present

## 2019-06-19 DIAGNOSIS — K8031 Calculus of bile duct with cholangitis, unspecified, with obstruction: Secondary | ICD-10-CM

## 2019-06-19 DIAGNOSIS — Z66 Do not resuscitate: Secondary | ICD-10-CM | POA: Diagnosis not present

## 2019-06-19 DIAGNOSIS — D649 Anemia, unspecified: Secondary | ICD-10-CM | POA: Diagnosis not present

## 2019-06-19 DIAGNOSIS — E46 Unspecified protein-calorie malnutrition: Secondary | ICD-10-CM | POA: Diagnosis not present

## 2019-06-19 DIAGNOSIS — Z515 Encounter for palliative care: Secondary | ICD-10-CM | POA: Diagnosis not present

## 2019-06-19 DIAGNOSIS — K831 Obstruction of bile duct: Secondary | ICD-10-CM

## 2019-06-19 DIAGNOSIS — I472 Ventricular tachycardia: Secondary | ICD-10-CM | POA: Diagnosis present

## 2019-06-19 DIAGNOSIS — D62 Acute posthemorrhagic anemia: Secondary | ICD-10-CM | POA: Diagnosis not present

## 2019-06-19 DIAGNOSIS — Z20828 Contact with and (suspected) exposure to other viral communicable diseases: Secondary | ICD-10-CM | POA: Diagnosis present

## 2019-06-19 DIAGNOSIS — I361 Nonrheumatic tricuspid (valve) insufficiency: Secondary | ICD-10-CM | POA: Diagnosis not present

## 2019-06-19 DIAGNOSIS — N179 Acute kidney failure, unspecified: Secondary | ICD-10-CM | POA: Diagnosis not present

## 2019-06-19 DIAGNOSIS — I5043 Acute on chronic combined systolic (congestive) and diastolic (congestive) heart failure: Secondary | ICD-10-CM

## 2019-06-19 DIAGNOSIS — R188 Other ascites: Secondary | ICD-10-CM | POA: Diagnosis present

## 2019-06-19 DIAGNOSIS — R570 Cardiogenic shock: Secondary | ICD-10-CM | POA: Diagnosis not present

## 2019-06-19 DIAGNOSIS — K567 Ileus, unspecified: Secondary | ICD-10-CM | POA: Diagnosis not present

## 2019-06-19 DIAGNOSIS — R1033 Periumbilical pain: Secondary | ICD-10-CM | POA: Diagnosis present

## 2019-06-19 DIAGNOSIS — I130 Hypertensive heart and chronic kidney disease with heart failure and stage 1 through stage 4 chronic kidney disease, or unspecified chronic kidney disease: Secondary | ICD-10-CM | POA: Diagnosis present

## 2019-06-19 DIAGNOSIS — N183 Chronic kidney disease, stage 3 unspecified: Secondary | ICD-10-CM | POA: Diagnosis not present

## 2019-06-19 DIAGNOSIS — K8309 Other cholangitis: Secondary | ICD-10-CM | POA: Diagnosis present

## 2019-06-19 DIAGNOSIS — N170 Acute kidney failure with tubular necrosis: Secondary | ICD-10-CM | POA: Diagnosis not present

## 2019-06-19 DIAGNOSIS — E873 Alkalosis: Secondary | ICD-10-CM | POA: Diagnosis not present

## 2019-06-19 DIAGNOSIS — N138 Other obstructive and reflux uropathy: Secondary | ICD-10-CM | POA: Diagnosis present

## 2019-06-19 DIAGNOSIS — R1310 Dysphagia, unspecified: Secondary | ICD-10-CM | POA: Diagnosis not present

## 2019-06-19 HISTORY — PX: REMOVAL OF STONES: SHX5545

## 2019-06-19 HISTORY — PX: SPHINCTEROTOMY: SHX5544

## 2019-06-19 HISTORY — PX: ENDOSCOPIC RETROGRADE CHOLANGIOPANCREATOGRAPHY (ERCP) WITH PROPOFOL: SHX5810

## 2019-06-19 LAB — LACTATE DEHYDROGENASE: LDH: 127 U/L (ref 98–192)

## 2019-06-19 LAB — CBC
HCT: 35.6 % — ABNORMAL LOW (ref 39.0–52.0)
Hemoglobin: 11.7 g/dL — ABNORMAL LOW (ref 13.0–17.0)
MCH: 28.7 pg (ref 26.0–34.0)
MCHC: 32.9 g/dL (ref 30.0–36.0)
MCV: 87.5 fL (ref 80.0–100.0)
Platelets: 211 10*3/uL (ref 150–400)
RBC: 4.07 MIL/uL — ABNORMAL LOW (ref 4.22–5.81)
RDW: 17 % — ABNORMAL HIGH (ref 11.5–15.5)
WBC: 9.2 10*3/uL (ref 4.0–10.5)
nRBC: 0.2 % (ref 0.0–0.2)

## 2019-06-19 LAB — CBG MONITORING, ED
Glucose-Capillary: 113 mg/dL — ABNORMAL HIGH (ref 70–99)
Glucose-Capillary: 113 mg/dL — ABNORMAL HIGH (ref 70–99)
Glucose-Capillary: 113 mg/dL — ABNORMAL HIGH (ref 70–99)
Glucose-Capillary: 110 mg/dL — ABNORMAL HIGH (ref 70–99)
Glucose-Capillary: 128 mg/dL — ABNORMAL HIGH (ref 70–99)

## 2019-06-19 LAB — MAGNESIUM: Magnesium: 2.1 mg/dL (ref 1.7–2.4)

## 2019-06-19 LAB — RENAL FUNCTION PANEL
Albumin: 2.2 g/dL — ABNORMAL LOW (ref 3.5–5.0)
Anion gap: 14 (ref 5–15)
BUN: 77 mg/dL — ABNORMAL HIGH (ref 8–23)
CO2: 21 mmol/L — ABNORMAL LOW (ref 22–32)
Calcium: 8.4 mg/dL — ABNORMAL LOW (ref 8.9–10.3)
Chloride: 102 mmol/L (ref 98–111)
Creatinine, Ser: 3.26 mg/dL — ABNORMAL HIGH (ref 0.61–1.24)
GFR calc Af Amer: 20 mL/min — ABNORMAL LOW (ref >60)
GFR calc non Af Amer: 18 mL/min — ABNORMAL LOW (ref >60)
Glucose, Bld: 118 mg/dL — ABNORMAL HIGH (ref 70–99)
Phosphorus: 5.9 mg/dL — ABNORMAL HIGH (ref 2.5–4.6)
Potassium: 4.1 mmol/L (ref 3.5–5.1)
Sodium: 137 mmol/L (ref 135–145)

## 2019-06-19 LAB — COMPREHENSIVE METABOLIC PANEL
ALT: 30 U/L (ref 0–44)
AST: 25 U/L (ref 15–41)
Albumin: 2.2 g/dL — ABNORMAL LOW (ref 3.5–5.0)
Alkaline Phosphatase: 70 U/L (ref 38–126)
Anion gap: 15 (ref 5–15)
BUN: 73 mg/dL — ABNORMAL HIGH (ref 8–23)
CO2: 21 mmol/L — ABNORMAL LOW (ref 22–32)
Calcium: 8.5 mg/dL — ABNORMAL LOW (ref 8.9–10.3)
Chloride: 100 mmol/L (ref 98–111)
Creatinine, Ser: 2.88 mg/dL — ABNORMAL HIGH (ref 0.61–1.24)
GFR calc Af Amer: 24 mL/min — ABNORMAL LOW (ref >60)
GFR calc non Af Amer: 20 mL/min — ABNORMAL LOW (ref >60)
Glucose, Bld: 120 mg/dL — ABNORMAL HIGH (ref 70–99)
Potassium: 4 mmol/L (ref 3.5–5.1)
Sodium: 136 mmol/L (ref 135–145)
Total Bilirubin: 14.8 mg/dL — ABNORMAL HIGH (ref 0.3–1.2)
Total Protein: 6.9 g/dL (ref 6.5–8.1)

## 2019-06-19 LAB — BRAIN NATRIURETIC PEPTIDE: B Natriuretic Peptide: 2536 pg/mL — ABNORMAL HIGH (ref 0.0–100.0)

## 2019-06-19 LAB — SARS CORONAVIRUS 2 BY RT PCR (HOSPITAL ORDER, PERFORMED IN ~~LOC~~ HOSPITAL LAB): SARS Coronavirus 2: NEGATIVE

## 2019-06-19 LAB — PROTIME-INR
INR: 1.5 — ABNORMAL HIGH (ref 0.8–1.2)
Prothrombin Time: 17.6 seconds — ABNORMAL HIGH (ref 11.4–15.2)

## 2019-06-19 SURGERY — ENDOSCOPIC RETROGRADE CHOLANGIOPANCREATOGRAPHY (ERCP) WITH PROPOFOL
Anesthesia: General

## 2019-06-19 MED ORDER — FUROSEMIDE 20 MG PO TABS
120.0000 mg | ORAL_TABLET | Freq: Every day | ORAL | Status: DC
  Administered 2019-06-19: 120 mg via ORAL
  Filled 2019-06-19: qty 6

## 2019-06-19 MED ORDER — ONDANSETRON HCL 4 MG/2ML IJ SOLN
INTRAMUSCULAR | Status: AC
  Filled 2019-06-19: qty 2

## 2019-06-19 MED ORDER — CARVEDILOL 12.5 MG PO TABS
6.2500 mg | ORAL_TABLET | Freq: Two times a day (BID) | ORAL | Status: DC
  Filled 2019-06-19: qty 1

## 2019-06-19 MED ORDER — ONDANSETRON HCL 4 MG/2ML IJ SOLN
4.0000 mg | Freq: Four times a day (QID) | INTRAMUSCULAR | Status: DC | PRN
  Administered 2019-06-20: 4 mg via INTRAVENOUS
  Filled 2019-06-19 (×2): qty 2

## 2019-06-19 MED ORDER — HYDROMORPHONE HCL 1 MG/ML IJ SOLN
0.5000 mg | INTRAMUSCULAR | Status: DC | PRN
  Administered 2019-06-19 (×2): 0.5 mg via INTRAVENOUS
  Filled 2019-06-19 (×2): qty 1

## 2019-06-19 MED ORDER — INDOMETHACIN 50 MG RE SUPP
RECTAL | Status: DC | PRN
  Administered 2019-06-19: 100 mg via RECTAL

## 2019-06-19 MED ORDER — DEXAMETHASONE SODIUM PHOSPHATE 10 MG/ML IJ SOLN
INTRAMUSCULAR | Status: DC | PRN
  Administered 2019-06-19: 5 mg via INTRAVENOUS

## 2019-06-19 MED ORDER — CHLORHEXIDINE GLUCONATE CLOTH 2 % EX PADS
6.0000 | MEDICATED_PAD | Freq: Every day | CUTANEOUS | Status: DC
  Administered 2019-06-20 – 2019-07-09 (×14): 6 via TOPICAL

## 2019-06-19 MED ORDER — ETOMIDATE 2 MG/ML IV SOLN
INTRAVENOUS | Status: DC | PRN
  Administered 2019-06-19: 20 mg via INTRAVENOUS

## 2019-06-19 MED ORDER — TAMSULOSIN HCL 0.4 MG PO CAPS
0.4000 mg | ORAL_CAPSULE | Freq: Every day | ORAL | Status: DC
  Administered 2019-06-19 – 2019-07-09 (×21): 0.4 mg via ORAL
  Filled 2019-06-19 (×21): qty 1

## 2019-06-19 MED ORDER — GLUCAGON HCL RDNA (DIAGNOSTIC) 1 MG IJ SOLR
INTRAMUSCULAR | Status: AC
  Filled 2019-06-19: qty 1

## 2019-06-19 MED ORDER — SODIUM CHLORIDE 0.9 % IV SOLN
INTRAVENOUS | Status: DC | PRN
  Administered 2019-06-19: 25 mL

## 2019-06-19 MED ORDER — FINASTERIDE 5 MG PO TABS
5.0000 mg | ORAL_TABLET | Freq: Every day | ORAL | Status: DC
  Administered 2019-06-20 – 2019-07-09 (×20): 5 mg via ORAL
  Filled 2019-06-19 (×21): qty 1

## 2019-06-19 MED ORDER — FUROSEMIDE 40 MG PO TABS
40.0000 mg | ORAL_TABLET | Freq: Every day | ORAL | Status: DC
  Administered 2019-06-20: 40 mg via ORAL
  Filled 2019-06-19: qty 1
  Filled 2019-06-19: qty 2

## 2019-06-19 MED ORDER — CIPROFLOXACIN IN D5W 400 MG/200ML IV SOLN
INTRAVENOUS | Status: AC
  Filled 2019-06-19: qty 200

## 2019-06-19 MED ORDER — SODIUM CHLORIDE 0.9 % IV SOLN
INTRAVENOUS | Status: AC
  Administered 2019-06-19: 02:00:00 via INTRAVENOUS

## 2019-06-19 MED ORDER — FENTANYL CITRATE (PF) 100 MCG/2ML IJ SOLN
INTRAMUSCULAR | Status: AC
  Filled 2019-06-19: qty 2

## 2019-06-19 MED ORDER — SODIUM CHLORIDE 0.9 % IV SOLN
INTRAVENOUS | Status: DC | PRN
  Administered 2019-06-19: 12:00:00 via INTRAVENOUS

## 2019-06-19 MED ORDER — INDOMETHACIN 50 MG RE SUPP: 100.0000 mg | Freq: Once | RECTAL | Status: DC

## 2019-06-19 MED ORDER — ROCURONIUM BROMIDE 10 MG/ML (PF) SYRINGE
PREFILLED_SYRINGE | INTRAVENOUS | Status: DC | PRN
  Administered 2019-06-19: 10 mg via INTRAVENOUS

## 2019-06-19 MED ORDER — SACUBITRIL-VALSARTAN 24-26 MG PO TABS
1.0000 | ORAL_TABLET | Freq: Two times a day (BID) | ORAL | Status: DC
  Filled 2019-06-19 (×2): qty 1

## 2019-06-19 MED ORDER — LIDOCAINE 2% (20 MG/ML) 5 ML SYRINGE
INTRAMUSCULAR | Status: DC | PRN
  Administered 2019-06-19: 80 mg via INTRAVENOUS

## 2019-06-19 MED ORDER — INSULIN ASPART 100 UNIT/ML ~~LOC~~ SOLN
0.0000 [IU] | SUBCUTANEOUS | Status: DC
  Administered 2019-06-19 – 2019-06-21 (×8): 1 [IU] via SUBCUTANEOUS
  Administered 2019-06-22 – 2019-06-23 (×3): 2 [IU] via SUBCUTANEOUS
  Administered 2019-06-23: 1 [IU] via SUBCUTANEOUS
  Administered 2019-06-23: 2 [IU] via SUBCUTANEOUS
  Administered 2019-06-23: 1 [IU] via SUBCUTANEOUS
  Administered 2019-06-25: 2 [IU] via SUBCUTANEOUS
  Administered 2019-06-25: 3 [IU] via SUBCUTANEOUS
  Administered 2019-06-25 – 2019-06-30 (×7): 1 [IU] via SUBCUTANEOUS
  Administered 2019-06-30: 2 [IU] via SUBCUTANEOUS

## 2019-06-19 MED ORDER — ADULT MULTIVITAMIN W/MINERALS CH
1.0000 | ORAL_TABLET | Freq: Every day | ORAL | Status: DC
  Administered 2019-06-19 – 2019-07-09 (×20): 1 via ORAL
  Filled 2019-06-19 (×21): qty 1

## 2019-06-19 MED ORDER — ASPIRIN EC 81 MG PO TBEC
81.0000 mg | DELAYED_RELEASE_TABLET | Freq: Every day | ORAL | Status: DC
  Administered 2019-06-19 – 2019-06-29 (×11): 81 mg via ORAL
  Filled 2019-06-19 (×11): qty 1

## 2019-06-19 MED ORDER — CARVEDILOL 6.25 MG PO TABS
6.2500 mg | ORAL_TABLET | Freq: Two times a day (BID) | ORAL | Status: DC
  Administered 2019-06-19 – 2019-06-20 (×3): 6.25 mg via ORAL
  Filled 2019-06-19 (×4): qty 1

## 2019-06-19 MED ORDER — ONDANSETRON HCL 4 MG/2ML IJ SOLN
INTRAMUSCULAR | Status: DC | PRN
  Administered 2019-06-19: 4 mg via INTRAVENOUS

## 2019-06-19 MED ORDER — SUCCINYLCHOLINE CHLORIDE 20 MG/ML IJ SOLN
INTRAMUSCULAR | Status: DC | PRN
  Administered 2019-06-19: 100 mg via INTRAVENOUS

## 2019-06-19 MED ORDER — PIPERACILLIN-TAZOBACTAM 3.375 G IVPB
3.3750 g | Freq: Three times a day (TID) | INTRAVENOUS | Status: DC
  Administered 2019-06-19 – 2019-06-20 (×4): 3.375 g via INTRAVENOUS
  Filled 2019-06-19 (×4): qty 50

## 2019-06-19 MED ORDER — SODIUM CHLORIDE 0.9 % IV SOLN
INTRAVENOUS | Status: DC | PRN
  Administered 2019-06-19: 50 ug/min via INTRAVENOUS

## 2019-06-19 MED ORDER — INDOMETHACIN 50 MG RE SUPP
RECTAL | Status: AC
  Filled 2019-06-19: qty 2

## 2019-06-19 NOTE — H&P (View-Only) (Signed)
[facility] Consult: 9:45 AM 06/19/2019  LOS: 0 days    Referring Provider: Dr [name]  Primary Care Physician:  Troy Hess., MD [facility] Primary Gastroenterologist:  unassigned     Reason for Consultation:  choledocholithiasis   HPI: [name] is Patton 78 y.o. male.  Hx CKD. 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 No prior colonoscopy or EGD.  Acute cholecystitis with sepsis, AKI.  Underwent 11/25/2017 percutaneous cholecystostomy tube placement.  Cholangiogram via PERC tube 12/27/2017 showed cholelithiasis, patent cystic and common bile ducts, patent cholecystostomy tube.  Repeat cholangiogram via tube 02/02/2018 again showing cholelithiasis, small, nonobstructing stones in the cystic duct and patent cystic and common bile ducts.  Percutaneous drain was pulled on 02/02/2018 Admission with acute on chronic heart failure in mid June 2020.  Starting Saturday, 4 days ago developed mild abdominal pain, nonbloody emesis once and fever chills which resolved within an hour or so.  Pain progressed over the next few days.  Started in the lower abdomen but quickly migrated to the right upper quadrant where he is currently having pain.  It is now severe.  Pain not radiating to his back.  Urine is dark.  No pruritus.  No recurrent fever or chills or recurrent nausea, vomiting.  Called EMS and transported to the ED today evening.  T bili 14.8.  Alkaline phosphatase, transaminases normal. WBCs normal.  Mild, normocytic anemia. INR slightly elevated at 1.5. Covid 19 negative U/Patton consistent with UTI BPH is 2536.   Abdominal ultrasound:  CBD 7 mm, mild intrahepatic biliary ductal dilatation."S/p cholecystectomy".  Slightly nodular liver, ?  Mild cirrhosis.  Trace ascites.  Doppler studies normal,  no portal vein thrombosis.  Addended ultrasound report states Cholelithiasis, decompressed gallbladder.  CTAP without contrast shows choledocholithiasis, 13 mm stone at distal duct with dilated CBD.  "Unexpectedly collapsed gallbladder given degree of biliary dilatation".  Moderately complex ascites, question leakage through cholecystostomy.  Cardiomegaly, anasarca, pleural effusions  Surgery has seen the patient and are dubious of the radiologic diagnosis of possible leakage through the cholecystostomy tube.  Feel he would have presented much sooner if this were the case.  In recent months patient has been using his sisters oxygen, mostly at night.  She was [facility] hospice patient and died about 3 weeks ago so he no longer had access to oxygen.  + Orthopnea.  Stable lower extremity edema.  Worsening of weakness in the last several days.  Walks with the assistance of Patton cane.  Lives by himself.  Family bring him meals but he has not been eating much in the last few days.  [name] history negative for colon cancer or gallbladder disease, negative for liver disease.  Past Medical History:  Diagnosis Date   Acute cholecystitis 11/23/2017   Acute respiratory failure with hypoxia (HCC)    Acute upper back pain    AKI (acute kidney injury) ([place])    Alcohol abuse 02/26/2015   Anemia, deficiency    Back pain    LUMBOSACRAL   Bronchitis, complicated  Cardiomegaly    Chronic combined systolic and diastolic CHF (congestive heart failure) (HCC)    Chronic pain    HIGH RISK MED USE COMPREHENSIVE HIGH RISK   CKD (chronic kidney disease), stage III    Demand ischemia ([place])    Patton. minimally elevated troponin peak 0.11 in 2016, felt demand ischemia. Cath offered but pt wished to have done back in [place].   Diabetes mellitus without complication (HCC)    Edema, leg    Elevated troponin 02/26/2015   Essential hypertension    Fatigue    Heart abnormality    Hyperlipidemia    Hypertension     Neck pain, acute    PVD (peripheral vascular disease) (HCC)    Renal lesion 11/23/2017   SOB (shortness of breath) on exertion    Solitary kidney    Vitamin D deficiency     Past Surgical History:  Procedure Laterality Date   IR CHOLANGIOGRAM EXISTING TUBE  12/27/2017   IR PERC CHOLECYSTOSTOMY  11/25/2017   IR RADIOLOGIST EVAL & MGMT  02/02/2018    Prior to Admission medications   Medication Sig Start Date End Date Taking? Authorizing Provider  aspirin EC 81 MG tablet Take 81 mg by mouth daily.    [provider]  carvedilol (COREG) 12.5 MG tablet Take 1 tablet (12.5 mg total) by mouth 2 (two) times daily. Patient taking differently: Take 6.25 mg by mouth 2 (two) times daily.  03/08/18 02/04/19  Troy Perch, NP  ENTRESTO 24-26 MG Take 1 tablet by mouth 2 (two) times daily. 01/16/19   [provider]  finasteride (PROSCAR) 5 MG tablet Take 1 tablet (5 mg total) by mouth daily. 09/03/18   Patton, Troy A, PA-C  furosemide (LASIX) 40 MG tablet Take 3 tablets (120 mg total) by mouth daily. 02/12/19   Troy Anis, MD  Multiple Vitamin (MULTIVITAMIN) tablet Take 1 tablet by mouth daily.    [provider]  tamsulosin (FLOMAX) 0.4 MG CAPS capsule Take 1 capsule (0.4 mg total) by mouth daily. 09/03/18   Troy Perna A, PA-C    Scheduled Meds:  aspirin EC  81 mg Oral Daily   carvedilol  6.25 mg Oral BID   finasteride  5 mg Oral Daily   furosemide  120 mg Oral Daily   insulin aspart  0-9 Units Subcutaneous Q4H   multivitamin with minerals  1 tablet Oral Daily   sacubitril-valsartan  1 tablet Oral BID   sodium chloride flush  3 mL Intravenous Once   tamsulosin  0.4 mg Oral Daily   Infusions:  sodium chloride 50 mL/hr at 06/19/19 0208   piperacillin-tazobactam (ZOSYN)  IV 3.375 g (06/19/19 0530)   PRN Meds: HYDROmorphone (DILAUDID) injection, ondansetron (ZOFRAN) IV   Allergies as of 06/18/2019   (No Known Allergies)    Family History    Problem Relation Age of Onset   Other Mother        died in her sleep at 38   Hypertension Sister    Hyperlipidemia Sister    Diabetes Mellitus II Sister    Heart disease Sister    CAD Neg Hx        neg hx premature CAD    Social History   Socioeconomic History   Marital status: Single    Spouse name: Not on file   Number of children: Not on file   Years of education: Not on file   Highest education level: Not on file  Occupational History  Not on file  Social Needs   Financial resource strain: Not on file   Food insecurity    Worry: Not on file    Inability: Not on file   Transportation needs    Medical: Not on file    Non-medical: Not on file  Tobacco Use   Smoking status: Former Smoker    Types: Cigarettes    Quit date: 02/26/2008    Years since quitting: 11.3   Smokeless tobacco: Never Used  Substance and Sexual Activity   Alcohol use: Yes    Comment: occ   Drug use: No   Sexual activity: Not on file  Lifestyle   Physical activity    Days per week: Not on file    Minutes per session: Not on file   Stress: Not on file  Relationships   Social connections    Talks on phone: Not on file    Gets together: Not on file    Attends religious service: Not on file    Active member of club or organization: Not on file    Attends meetings of clubs or organizations: Not on file    Relationship status: Not on file   Intimate partner violence    Fear of current or ex partner: Not on file    Emotionally abused: Not on file    Physically abused: Not on file    Forced sexual activity: Not on file  Other Topics Concern   Not on file  Social History Narrative   Not on file    REVIEW OF SYSTEMS: Constitutional: Weakness ENT:  No nose bleeds Pulm: Stable dyspnea, especially when flat. CV:  No palpitations, no LE edema.  PND.  No chest pain. GU:  No hematuria, no frequency GI: HPI Heme: Denies excessive or unusual bleeding or  bruising. Transfusions: None in the records. Neuro:  No headaches, no peripheral tingling or numbness.  No syncope, no seizures. Derm:  No itching, no rash or sores.  Endocrine:  No sweats or chills.  No polyuria or dysuria Immunization: Not asked him about recent vaccinations. Travel:  None beyond local counties in last few months.    PHYSICAL EXAM: Vital signs in last 24 hours: Vitals:   06/19/19 0728 06/19/19 0800  BP:  121/75  Pulse: (!) 109 (!) 107  Resp: (!) 25 (!) 23  Temp:    SpO2: 94% 95%   Wt Readings from Last 3 Encounters:  06/19/19 90 kg  02/12/19 86.2 kg  09/13/18 92 kg    General: Ill appearing, both acutely and chronically.  Alert and able to provide good history Head: Facial asymmetry or swelling.  No signs of head trauma. Eyes: Icteric sclera.  No conjunctival pallor.  EOMI.  Arcus senilis. Ears: Not hard of hearing Nose: No discharge Mouth: Moist, clear, pink oral mucosa.  Tongue midline. Neck: No JVD, no masses, no thyromegaly. Lungs: No labored breathing or cough.  Lungs clear bilaterally. Heart: RRR.  No MRG.  S1, S2 present. Abdomen: Nondistended, soft.  Small umbilical hernia.  No organomegaly, bruits.  Tender in the right upper quadrant diffusely without guarding or rebound.  Lesser tenderness in the left and lower abdomen..   GU: Indwelling Foley in place.  Urine is amber-colored. Rectal: Deferred Musc/Skeltl: No joint swelling or redness. Extremities: Nonpitting, tense, slight edema in the legs. Neurologic: Alert.  Oriented x3.  No tremors or asterixis.  Moves all 4 limbs, strength not tested. Skin: Patton few, nonbleeding sores on the right  lower leg.    Tattoos: None observed Psych: Pleasant, cooperative, fluid speech.  Intake/Output from previous day: 10/19 0701 - 10/20 0700 In: 50 [IV Piggyback:50] Out: -  Intake/Output this shift: No intake/output data recorded.  LAB RESULTS: Recent Labs    06/18/19 2054 06/19/19 0819  WBC 9.9 9.2   HGB 12.1* 11.7*  HCT 36.3* 35.6*  PLT 226 211   BMET Lab Results  Component Value Date   NA 136 06/19/2019   NA 133 (L) 06/18/2019   NA 136 02/12/2019   K 4.0 06/19/2019   K 4.1 06/18/2019   K 3.8 02/12/2019   CL 100 06/19/2019   CL 98 06/18/2019   CL 97 (L) 02/12/2019   CO2 21 (L) 06/19/2019   CO2 20 (L) 06/18/2019   CO2 30 02/12/2019   GLUCOSE 120 (H) 06/19/2019   GLUCOSE 127 (H) 06/18/2019   GLUCOSE 100 (H) 02/12/2019   BUN 73 (H) 06/19/2019   BUN 69 (H) 06/18/2019   BUN 46 (H) 02/12/2019   CREATININE 2.88 (H) 06/19/2019   CREATININE 3.09 (H) 06/18/2019   CREATININE 2.23 (H) 02/12/2019   CALCIUM 8.5 (L) 06/19/2019   CALCIUM 8.5 (L) 06/18/2019   CALCIUM 8.7 (L) 02/12/2019   LFT Recent Labs    06/18/19 2054 06/19/19 0819  PROT 7.7 6.9  ALBUMIN 2.5* 2.2*  AST 31 25  ALT 37 30  ALKPHOS 89 70  BILITOT 13.4* 14.8*   PT/INR Lab Results  Component Value Date   INR 1.5 (H) 06/19/2019   INR 1.27 11/23/2017   INR 1.11 02/26/2015   Hepatitis Panel No results for input(s): HEPBSAG, HCVAB, HEPAIGM, HEPBIGM in the last 72 hours. C-Diff No components found for: CDIFF Lipase     Component Value Date/Time   LIPASE 220 (H) 06/18/2019 2054    Drugs of Abuse  No results found for: LABOPIA, COCAINSCRNUR, LABBENZ, AMPHETMU, THCU, LABBARB   RADIOLOGY STUDIES: Ct Abdomen Pelvis Wo Contrast  Result Date: 06/19/2019 CLINICAL DATA:  Nausea and vomiting with right upper quadrant pain EXAM: CT ABDOMEN AND PELVIS WITHOUT CONTRAST TECHNIQUE: Multidetector CT imaging of the abdomen and pelvis was performed following the standard protocol without IV contrast. COMPARISON:  11/23/2017 FINDINGS: Lower chest: Small right more than left pleural effusion. Chronic cardiomegaly. Hepatobiliary: Small hepatic low densities also seen on prior.New bile duct dilatation that is both intra and extrahepatic with Patton 13 mm stone at the level of the distal common bile duct. There are likely  additional more subtly calcified calculi in the distal CBD. Subtly cholelithiasis within the collapsed gallbladder evaluated by ultrasound earlier today. Pancreas: No convincing pancreatitis. Spleen: Unremarkable. Adrenals/Urinary Tract: Negative adrenals. 4 mm high-density right renal lesion, nonspecific without contrast. Bladder is decompressed by Foley catheter. Stomach/Bowel: No obstruction. No evidence of bowel inflammation. Predominantly distal colonic diverticulosis. Vascular/Lymphatic: No acute vascular abnormality. Extensive aortic and iliac atherosclerosis. No mass or adenopathy. Reproductive:Moderate ascites with pockets of high-density in the lower right flank and pelvis. Other: Anasarca and generalized retroperitoneal edema. Musculoskeletal: Expansion with cortical sclerosis and thickening of the left ilium, pagetoid and stable. Lower lumbar facet arthropathy and disc narrowing with L4-5 spinal stenosis. These results were called by telephone at the time of interpretation on 06/19/2019 at 4:46 am to provider [place], who verbally acknowledged these results. IMPRESSION: 1. Biliary dilatation due to choledocholithiasis. Patton 13 mm stone is present at the distal common bile duct with probable additional less calcified calculi. 2. Unexpectedly collapsed gallbladder given the degree of biliary  dilatation. There is history of cholecystostomy and there is moderate complex ascites (possible hemoperitoneum) question leakage through the cholecystotomy. 3. Cardiomegaly with anasarca and small pleural effusions. Electronically Signed   By: Monte Fantasia M.D.   On: 06/19/2019 04:48   Dg Chest 2 View  Result Date: 06/18/2019 CLINICAL DATA:  Shortness of breath EXAM: CHEST - 2 VIEW COMPARISON:  02/04/2019 FINDINGS: Small right-sided pleural effusion. Cardiomegaly with vascular congestion. Hazy atelectasis at the bases. No pneumothorax. IMPRESSION: 1. Cardiomegaly with vascular congestion 2. Small right-sided  pleural effusion and hazy atelectasis at the right base. Electronically Signed   By: Donavan Foil M.D.   On: 06/18/2019 21:37   US Abdomen Limited  Addendum Date: 06/19/2019   ADDENDUM REPORT: 06/19/2019 00:59 ADDENDUM: New images were obtained. The gallbladder appears to be decompressed with shadowing echogenic stones the largest measuring 1.4 cm. The gallbladder wall measures 2.7 mm. No pericholecystic fluid. In the body of the report the lesion mentioned should be hyperechoic, as on prior exam and likely hemangioma. IMPRESSION: Decompressed gallbladder with cholelithiasis, the largest stone measuring 1.4 mm. Electronically Signed   By: Prudencio Pair M.D.   On: 06/19/2019 00:59   Result Date: 06/19/2019 CLINICAL DATA:  Cholangitis EXAM: ULTRASOUND ABDOMEN LIMITED RIGHT UPPER QUADRANT COMPARISON:  None. FINDINGS: Gallbladder: The patient is status post cholecystectomy. Common bile duct: Diameter: 7 mm Liver: Within the right hepatic lobe there is Patton hypoechoic lesion measuring 1.1 x 1.0 x 1.4 cm. There is mild intrahepatic biliary ductal dilatation. There is Patton slightly heterogeneous appearance to the liver parenchyma. There is question of Patton slightly nodular contour of the liver parenchyma. Trace perihepatic ascites seen. Portal vein is patent on color Doppler imaging with normal direction of blood flow towards the liver. Other: None. IMPRESSION: Trace perihepatic ascites. Mild intrahepatic biliary ductal dilatation. Status post cholecystectomy. Slightly heterogeneous liver parenchyma with slightly nodular contour. This is non-specific could be due to mild cirrhosis. Electronically Signed: By: Prudencio Pair M.D. On: 06/19/2019 00:13     IMPRESSION:   *   Choledocholithiasis.  *     Acute cholecystitis 10/2017 managed with percutaneous drainage.  Drain removed in 01/2018.  *    Ascites.  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  *      AKI, baseline stage III/IV CKD.  Not oliguric  *    CHF.  Elevated BNP, small pleural effusions.  No new dyspnea but baseline orthopnea.  Had been borrowing his sister's oxygen but this is no longer available to the patient.  Has never been described oxygen by Pat[place] physician.    PLAN:     *   ERCP today.  He is already received Zosyn at 530 this AM,  Will request Rn to give dose that was due at 10 AM.     Azucena Freed  06/19/2019, 9:45 AM Phone [phone]  ________________________________________________________________________  Velora Heckler GI MD note:  I personally examined the patient, reviewed the data and agree with the assessment and plan described above.  He has complicated gallstone disease, previous acute cholecystitis treated with temporary drain and abx alone.  Now clearly has an obstructing CBD stone causing pain, jaundice with Patton Tbili of 15.  Significant cardiac disease, EF on echo was 25-30% at [facility] facility 12/2018, BNP 2,500 yesterday (actually lower than 4 months ago when it was 4K).  I recommended we proceed with ERCP  with GA.  He understands there are risks including perforation, pancreatitis, bleeding as well as significant cardiopulm events and wishes to proceed.   Owens Loffler, MD [facility] Pager [phone]

## 2019-06-19 NOTE — ED Notes (Signed)
Patient transported to Ultrasound 

## 2019-06-19 NOTE — ED Notes (Signed)
6N nurse unable to take report at this time.

## 2019-06-19 NOTE — Consult Note (Addendum)
[facility] Consult: 9:45 AM 06/19/2019  LOS: 0 days    Referring Provider: Dr [name]  Primary Care Physician:  Troy Hess., MD [facility] Primary Gastroenterologist:  unassigned     Reason for Consultation:  choledocholithiasis   HPI: [name] is Patton 78 y.o. male.  Hx CKD. 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 No prior colonoscopy or EGD.  Acute cholecystitis with sepsis, AKI.  Underwent 11/25/2017 percutaneous cholecystostomy tube placement.  Cholangiogram via PERC tube 12/27/2017 showed cholelithiasis, patent cystic and common bile ducts, patent cholecystostomy tube.  Repeat cholangiogram via tube 02/02/2018 again showing cholelithiasis, small, nonobstructing stones in the cystic duct and patent cystic and common bile ducts.  Percutaneous drain was pulled on 02/02/2018 Admission with acute on chronic heart failure in mid June 2020.  Starting Saturday, 4 days ago developed mild abdominal pain, nonbloody emesis once and fever chills which resolved within an hour or so.  Pain progressed over the next few days.  Started in the lower abdomen but quickly migrated to the right upper quadrant where he is currently having pain.  It is now severe.  Pain not radiating to his back.  Urine is dark.  No pruritus.  No recurrent fever or chills or recurrent nausea, vomiting.  Called EMS and transported to the ED today evening.  T bili 14.8.  Alkaline phosphatase, transaminases normal. WBCs normal.  Mild, normocytic anemia. INR slightly elevated at 1.5. Covid 19 negative U/Patton consistent with UTI BPH is 2536.   Abdominal ultrasound:  CBD 7 mm, mild intrahepatic biliary ductal dilatation."S/p cholecystectomy".  Slightly nodular liver, ?  Mild cirrhosis.  Trace ascites.  Doppler studies normal,  no portal vein thrombosis.  Addended ultrasound report states Cholelithiasis, decompressed gallbladder.  CTAP without contrast shows choledocholithiasis, 13 mm stone at distal duct with dilated CBD.  "Unexpectedly collapsed gallbladder given degree of biliary dilatation".  Moderately complex ascites, question leakage through cholecystostomy.  Cardiomegaly, anasarca, pleural effusions  Surgery has seen the patient and are dubious of the radiologic diagnosis of possible leakage through the cholecystostomy tube.  Feel he would have presented much sooner if this were the case.  In recent months patient has been using his sisters oxygen, mostly at night.  She was [facility] hospice patient and died about 3 weeks ago so he no longer had access to oxygen.  + Orthopnea.  Stable lower extremity edema.  Worsening of weakness in the last several days.  Walks with the assistance of Patton cane.  Lives by himself.  Family bring him meals but he has not been eating much in the last few days.  [name] history negative for colon cancer or gallbladder disease, negative for liver disease.  Past Medical History:  Diagnosis Date   Acute cholecystitis 11/23/2017   Acute respiratory failure with hypoxia (HCC)    Acute upper back pain    AKI (acute kidney injury) ([place])    Alcohol abuse 02/26/2015   Anemia, deficiency    Back pain    LUMBOSACRAL   Bronchitis, complicated  Cardiomegaly    Chronic combined systolic and diastolic CHF (congestive heart failure) (HCC)    Chronic pain    HIGH RISK MED USE COMPREHENSIVE HIGH RISK   CKD (chronic kidney disease), stage III    Demand ischemia ([place])    Patton. minimally elevated troponin peak 0.11 in 2016, felt demand ischemia. Cath offered but pt wished to have done back in [place].   Diabetes mellitus without complication (HCC)    Edema, leg    Elevated troponin 02/26/2015   Essential hypertension    Fatigue    Heart abnormality    Hyperlipidemia    Hypertension     Neck pain, acute    PVD (peripheral vascular disease) (HCC)    Renal lesion 11/23/2017   SOB (shortness of breath) on exertion    Solitary kidney    Vitamin D deficiency     Past Surgical History:  Procedure Laterality Date   IR CHOLANGIOGRAM EXISTING TUBE  12/27/2017   IR PERC CHOLECYSTOSTOMY  11/25/2017   IR RADIOLOGIST EVAL & MGMT  02/02/2018    Prior to Admission medications   Medication Sig Start Date End Date Taking? Authorizing Provider  aspirin EC 81 MG tablet Take 81 mg by mouth daily.    [provider]  carvedilol (COREG) 12.5 MG tablet Take 1 tablet (12.5 mg total) by mouth 2 (two) times daily. Patient taking differently: Take 6.25 mg by mouth 2 (two) times daily.  03/08/18 02/04/19  Troy Perch, NP  ENTRESTO 24-26 MG Take 1 tablet by mouth 2 (two) times daily. 01/16/19   [provider]  finasteride (PROSCAR) 5 MG tablet Take 1 tablet (5 mg total) by mouth daily. 09/03/18   Patton, Troy A, PA-C  furosemide (LASIX) 40 MG tablet Take 3 tablets (120 mg total) by mouth daily. 02/12/19   Troy Anis, MD  Multiple Vitamin (MULTIVITAMIN) tablet Take 1 tablet by mouth daily.    [provider]  tamsulosin (FLOMAX) 0.4 MG CAPS capsule Take 1 capsule (0.4 mg total) by mouth daily. 09/03/18   Troy Perna A, PA-C    Scheduled Meds:  aspirin EC  81 mg Oral Daily   carvedilol  6.25 mg Oral BID   finasteride  5 mg Oral Daily   furosemide  120 mg Oral Daily   insulin aspart  0-9 Units Subcutaneous Q4H   multivitamin with minerals  1 tablet Oral Daily   sacubitril-valsartan  1 tablet Oral BID   sodium chloride flush  3 mL Intravenous Once   tamsulosin  0.4 mg Oral Daily   Infusions:  sodium chloride 50 mL/hr at 06/19/19 0208   piperacillin-tazobactam (ZOSYN)  IV 3.375 g (06/19/19 0530)   PRN Meds: HYDROmorphone (DILAUDID) injection, ondansetron (ZOFRAN) IV   Allergies as of 06/18/2019   (No Known Allergies)    Family History    Problem Relation Age of Onset   Other Mother        died in her sleep at 44   Hypertension Sister    Hyperlipidemia Sister    Diabetes Mellitus II Sister    Heart disease Sister    CAD Neg Hx        neg hx premature CAD    Social History   Socioeconomic History   Marital status: Single    Spouse name: Not on file   Number of children: Not on file   Years of education: Not on file   Highest education level: Not on file  Occupational History  Not on file  Social Needs   Financial resource strain: Not on file   Food insecurity    Worry: Not on file    Inability: Not on file   Transportation needs    Medical: Not on file    Non-medical: Not on file  Tobacco Use   Smoking status: Former Smoker    Types: Cigarettes    Quit date: 02/26/2008    Years since quitting: 11.3   Smokeless tobacco: Never Used  Substance and Sexual Activity   Alcohol use: Yes    Comment: occ   Drug use: No   Sexual activity: Not on file  Lifestyle   Physical activity    Days per week: Not on file    Minutes per session: Not on file   Stress: Not on file  Relationships   Social connections    Talks on phone: Not on file    Gets together: Not on file    Attends religious service: Not on file    Active member of club or organization: Not on file    Attends meetings of clubs or organizations: Not on file    Relationship status: Not on file   Intimate partner violence    Fear of current or ex partner: Not on file    Emotionally abused: Not on file    Physically abused: Not on file    Forced sexual activity: Not on file  Other Topics Concern   Not on file  Social History Narrative   Not on file    REVIEW OF SYSTEMS: Constitutional: Weakness ENT:  No nose bleeds Pulm: Stable dyspnea, especially when flat. CV:  No palpitations, no LE edema.  PND.  No chest pain. GU:  No hematuria, no frequency GI: HPI Heme: Denies excessive or unusual bleeding or  bruising. Transfusions: None in the records. Neuro:  No headaches, no peripheral tingling or numbness.  No syncope, no seizures. Derm:  No itching, no rash or sores.  Endocrine:  No sweats or chills.  No polyuria or dysuria Immunization: Not asked him about recent vaccinations. Travel:  None beyond local counties in last few months.    PHYSICAL EXAM: Vital signs in last 24 hours: Vitals:   06/19/19 0728 06/19/19 0800  BP:  121/75  Pulse: (!) 109 (!) 107  Resp: (!) 25 (!) 23  Temp:    SpO2: 94% 95%   Wt Readings from Last 3 Encounters:  06/19/19 90 kg  02/12/19 86.2 kg  09/13/18 92 kg    General: Ill appearing, both acutely and chronically.  Alert and able to provide good history Head: Facial asymmetry or swelling.  No signs of head trauma. Eyes: Icteric sclera.  No conjunctival pallor.  EOMI.  Arcus senilis. Ears: Not hard of hearing Nose: No discharge Mouth: Moist, clear, pink oral mucosa.  Tongue midline. Neck: No JVD, no masses, no thyromegaly. Lungs: No labored breathing or cough.  Lungs clear bilaterally. Heart: RRR.  No MRG.  S1, S2 present. Abdomen: Nondistended, soft.  Small umbilical hernia.  No organomegaly, bruits.  Tender in the right upper quadrant diffusely without guarding or rebound.  Lesser tenderness in the left and lower abdomen..   GU: Indwelling Foley in place.  Urine is amber-colored. Rectal: Deferred Musc/Skeltl: No joint swelling or redness. Extremities: Nonpitting, tense, slight edema in the legs. Neurologic: Alert.  Oriented x3.  No tremors or asterixis.  Moves all 4 limbs, strength not tested. Skin: Patton few, nonbleeding sores on the right  lower leg.    Tattoos: None observed Psych: Pleasant, cooperative, fluid speech.  Intake/Output from previous day: 10/19 0701 - 10/20 0700 In: 50 [IV Piggyback:50] Out: -  Intake/Output this shift: No intake/output data recorded.  LAB RESULTS: Recent Labs    06/18/19 2054 06/19/19 0819  WBC 9.9 9.2   HGB 12.1* 11.7*  HCT 36.3* 35.6*  PLT 226 211   BMET Lab Results  Component Value Date   NA 136 06/19/2019   NA 133 (L) 06/18/2019   NA 136 02/12/2019   K 4.0 06/19/2019   K 4.1 06/18/2019   K 3.8 02/12/2019   CL 100 06/19/2019   CL 98 06/18/2019   CL 97 (L) 02/12/2019   CO2 21 (L) 06/19/2019   CO2 20 (L) 06/18/2019   CO2 30 02/12/2019   GLUCOSE 120 (H) 06/19/2019   GLUCOSE 127 (H) 06/18/2019   GLUCOSE 100 (H) 02/12/2019   BUN 73 (H) 06/19/2019   BUN 69 (H) 06/18/2019   BUN 46 (H) 02/12/2019   CREATININE 2.88 (H) 06/19/2019   CREATININE 3.09 (H) 06/18/2019   CREATININE 2.23 (H) 02/12/2019   CALCIUM 8.5 (L) 06/19/2019   CALCIUM 8.5 (L) 06/18/2019   CALCIUM 8.7 (L) 02/12/2019   LFT Recent Labs    06/18/19 2054 06/19/19 0819  PROT 7.7 6.9  ALBUMIN 2.5* 2.2*  AST 31 25  ALT 37 30  ALKPHOS 89 70  BILITOT 13.4* 14.8*   PT/INR Lab Results  Component Value Date   INR 1.5 (H) 06/19/2019   INR 1.27 11/23/2017   INR 1.11 02/26/2015   Hepatitis Panel No results for input(s): HEPBSAG, HCVAB, HEPAIGM, HEPBIGM in the last 72 hours. C-Diff No components found for: CDIFF Lipase     Component Value Date/Time   LIPASE 220 (H) 06/18/2019 2054    Drugs of Abuse  No results found for: LABOPIA, COCAINSCRNUR, LABBENZ, AMPHETMU, THCU, LABBARB   RADIOLOGY STUDIES: Ct Abdomen Pelvis Wo Contrast  Result Date: 06/19/2019 CLINICAL DATA:  Nausea and vomiting with right upper quadrant pain EXAM: CT ABDOMEN AND PELVIS WITHOUT CONTRAST TECHNIQUE: Multidetector CT imaging of the abdomen and pelvis was performed following the standard protocol without IV contrast. COMPARISON:  11/23/2017 FINDINGS: Lower chest: Small right more than left pleural effusion. Chronic cardiomegaly. Hepatobiliary: Small hepatic low densities also seen on prior.New bile duct dilatation that is both intra and extrahepatic with Patton 13 mm stone at the level of the distal common bile duct. There are likely  additional more subtly calcified calculi in the distal CBD. Subtly cholelithiasis within the collapsed gallbladder evaluated by ultrasound earlier today. Pancreas: No convincing pancreatitis. Spleen: Unremarkable. Adrenals/Urinary Tract: Negative adrenals. 4 mm high-density right renal lesion, nonspecific without contrast. Bladder is decompressed by Foley catheter. Stomach/Bowel: No obstruction. No evidence of bowel inflammation. Predominantly distal colonic diverticulosis. Vascular/Lymphatic: No acute vascular abnormality. Extensive aortic and iliac atherosclerosis. No mass or adenopathy. Reproductive:Moderate ascites with pockets of high-density in the lower right flank and pelvis. Other: Anasarca and generalized retroperitoneal edema. Musculoskeletal: Expansion with cortical sclerosis and thickening of the left ilium, pagetoid and stable. Lower lumbar facet arthropathy and disc narrowing with L4-5 spinal stenosis. These results were called by telephone at the time of interpretation on 06/19/2019 at 4:46 am to provider [place], who verbally acknowledged these results. IMPRESSION: 1. Biliary dilatation due to choledocholithiasis. Patton 13 mm stone is present at the distal common bile duct with probable additional less calcified calculi. 2. Unexpectedly collapsed gallbladder given the degree of biliary  dilatation. There is history of cholecystostomy and there is moderate complex ascites (possible hemoperitoneum) question leakage through the cholecystotomy. 3. Cardiomegaly with anasarca and small pleural effusions. Electronically Signed   By: Monte Fantasia M.D.   On: 06/19/2019 04:48   Dg Chest 2 View  Result Date: 06/18/2019 CLINICAL DATA:  Shortness of breath EXAM: CHEST - 2 VIEW COMPARISON:  02/04/2019 FINDINGS: Small right-sided pleural effusion. Cardiomegaly with vascular congestion. Hazy atelectasis at the bases. No pneumothorax. IMPRESSION: 1. Cardiomegaly with vascular congestion 2. Small right-sided  pleural effusion and hazy atelectasis at the right base. Electronically Signed   By: Donavan Foil M.D.   On: 06/18/2019 21:37   US Abdomen Limited  Addendum Date: 06/19/2019   ADDENDUM REPORT: 06/19/2019 00:59 ADDENDUM: New images were obtained. The gallbladder appears to be decompressed with shadowing echogenic stones the largest measuring 1.4 cm. The gallbladder wall measures 2.7 mm. No pericholecystic fluid. In the body of the report the lesion mentioned should be hyperechoic, as on prior exam and likely hemangioma. IMPRESSION: Decompressed gallbladder with cholelithiasis, the largest stone measuring 1.4 mm. Electronically Signed   By: Prudencio Pair M.D.   On: 06/19/2019 00:59   Result Date: 06/19/2019 CLINICAL DATA:  Cholangitis EXAM: ULTRASOUND ABDOMEN LIMITED RIGHT UPPER QUADRANT COMPARISON:  None. FINDINGS: Gallbladder: The patient is status post cholecystectomy. Common bile duct: Diameter: 7 mm Liver: Within the right hepatic lobe there is Patton hypoechoic lesion measuring 1.1 x 1.0 x 1.4 cm. There is mild intrahepatic biliary ductal dilatation. There is Patton slightly heterogeneous appearance to the liver parenchyma. There is question of Patton slightly nodular contour of the liver parenchyma. Trace perihepatic ascites seen. Portal vein is patent on color Doppler imaging with normal direction of blood flow towards the liver. Other: None. IMPRESSION: Trace perihepatic ascites. Mild intrahepatic biliary ductal dilatation. Status post cholecystectomy. Slightly heterogeneous liver parenchyma with slightly nodular contour. This is non-specific could be due to mild cirrhosis. Electronically Signed: By: Prudencio Pair M.D. On: 06/19/2019 00:13     IMPRESSION:   *   Choledocholithiasis.  *     Acute cholecystitis 10/2017 managed with percutaneous drainage.  Drain removed in 01/2018.  *    Ascites.  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  *      AKI, baseline stage III/IV CKD.  Not oliguric  *    CHF.  Elevated BNP, small pleural effusions.  No new dyspnea but baseline orthopnea.  Had been borrowing his sister's oxygen but this is no longer available to the patient.  Has never been described oxygen by [facility] physician.    PLAN:     *   ERCP today.  He is already received Zosyn at 530 this AM,  Will request Rn to give dose that was due at 10 AM.     Azucena Freed  06/19/2019, 9:45 AM Phone [phone]  ________________________________________________________________________  Velora Heckler GI MD note:  I personally examined the patient, reviewed the data and agree with the assessment and plan described above.  He has complicated gallstone disease, previous acute cholecystitis treated with temporary drain and abx alone.  Now clearly has an obstructing CBD stone causing pain, jaundice with Patton Tbili of 15.  Significant cardiac disease, EF on echo was 25-30% at [facility] facility 12/2018, BNP 2,500 yesterday (actually lower than 4 months ago when it was 4K).  I recommended we proceed with ERCP  with GA.  He understands there are risks including perforation, pancreatitis, bleeding as well as significant cardiopulm events and wishes to proceed.   Owens Loffler, MD [facility] Pager [phone]

## 2019-06-19 NOTE — Progress Notes (Signed)
Patient ID: [name], male   DOB: 04-30-1941, 78 y.o.   MRN: [id number]    Day of Surgery  Subjective: Patient briefly seen secondary to a couple days of abdominal pain.  He underwent placement of a perc chole drain in March of 2019 secondary to cholecystitis and felt to not be a good surgical candidate by cardiology.  The perc chole drain was removed several months later at the patient's request.  He has not undergone surgical resection secondary to his cardiac risk factors. He has significant heart failure and was recently admitted with a BNP >4500.  It is around 2600 today.  He also has a chronic urinary catheter as he is not a surgical candidate to undergo prostate surgery.  He presents today with a TB of 13 and evidence of a 60mm stone in the CBD.  Objective: Vital signs in last 24 hours: Temp:  [97.1 F (36.2 C)-99.8 F (37.7 C)] 99.8 F (37.7 C) (10/20 0016) Pulse Rate:  [100-118] 108 (10/20 1057) Resp:  [16-25] 25 (10/20 0945) BP: (92-135)/(70-92) 92/81 (10/20 1057) SpO2:  [93 %-100 %] 94 % (10/20 0945) Weight:  [90 kg] 90 kg (10/20 0103)    Intake/Output from previous day: 10/19 0701 - 10/20 0700 In: 50 [IV Piggyback:50] Out: -  Intake/Output this shift: No intake/output data recorded.  PE: Heart: regular Lungs: somewhat CTAB, but clearly appears to be at least mildly SOB.  O2 sat 94% on ra (was using his sister's O2 at night to sleep prior to her death 3 weeks ago, admits to PND) Abd: soft, tender in RUQ, reducible umbilical hernia, +BS  Lab Results:  Recent Labs    06/18/19 2054 06/19/19 0819  WBC 9.9 9.2  HGB 12.1* 11.7*  HCT 36.3* 35.6*  PLT 226 211   BMET Recent Labs    06/18/19 2054 06/19/19 0819  NA 133* 136  K 4.1 4.0  CL 98 100  CO2 20* 21*  GLUCOSE 127* 120*  BUN 69* 73*  CREATININE 3.09* 2.88*  CALCIUM 8.5* 8.5*   PT/INR Recent Labs    06/19/19 0036  LABPROT 17.6*  INR 1.5*   CMP     Component Value Date/Time   NA 136  06/19/2019 0819   K 4.0 06/19/2019 0819   CL 100 06/19/2019 0819   CO2 21 (L) 06/19/2019 0819   GLUCOSE 120 (H) 06/19/2019 0819   BUN 73 (H) 06/19/2019 0819   CREATININE 2.88 (H) 06/19/2019 0819   CALCIUM 8.5 (L) 06/19/2019 0819   PROT 6.9 06/19/2019 0819   ALBUMIN 2.2 (L) 06/19/2019 0819   AST 25 06/19/2019 0819   ALT 30 06/19/2019 0819   ALKPHOS 70 06/19/2019 0819   BILITOT 14.8 (H) 06/19/2019 0819   GFRNONAA 20 (L) 06/19/2019 0819   GFRAA 24 (L) 06/19/2019 0819   Lipase     Component Value Date/Time   LIPASE 220 (H) 06/18/2019 2054       Studies/Results: Ct Abdomen Pelvis Wo Contrast  Result Date: 06/19/2019 CLINICAL DATA:  Nausea and vomiting with right upper quadrant pain EXAM: CT ABDOMEN AND PELVIS WITHOUT CONTRAST TECHNIQUE: Multidetector CT imaging of the abdomen and pelvis was performed following the standard protocol without IV contrast. COMPARISON:  11/23/2017 FINDINGS: Lower chest: Small right more than left pleural effusion. Chronic cardiomegaly. Hepatobiliary: Small hepatic low densities also seen on prior.New bile duct dilatation that is both intra and extrahepatic with a 13 mm stone at the level of the distal common bile duct. There  are likely additional more subtly calcified calculi in the distal CBD. Subtly cholelithiasis within the collapsed gallbladder evaluated by ultrasound earlier today. Pancreas: No convincing pancreatitis. Spleen: Unremarkable. Adrenals/Urinary Tract: Negative adrenals. 4 mm high-density right renal lesion, nonspecific without contrast. Bladder is decompressed by Foley catheter. Stomach/Bowel: No obstruction. No evidence of bowel inflammation. Predominantly distal colonic diverticulosis. Vascular/Lymphatic: No acute vascular abnormality. Extensive aortic and iliac atherosclerosis. No mass or adenopathy. Reproductive:Moderate ascites with pockets of high-density in the lower right flank and pelvis. Other: Anasarca and generalized retroperitoneal  edema. Musculoskeletal: Expansion with cortical sclerosis and thickening of the left ilium, pagetoid and stable. Lower lumbar facet arthropathy and disc narrowing with L4-5 spinal stenosis. These results were called by telephone at the time of interpretation on 06/19/2019 at 4:46 am to provider [place], who verbally acknowledged these results. IMPRESSION: 1. Biliary dilatation due to choledocholithiasis. A 13 mm stone is present at the distal common bile duct with probable additional less calcified calculi. 2. Unexpectedly collapsed gallbladder given the degree of biliary dilatation. There is history of cholecystostomy and there is moderate complex ascites (possible hemoperitoneum) question leakage through the cholecystotomy. 3. Cardiomegaly with anasarca and small pleural effusions. Electronically Signed   By: Monte Fantasia M.D.   On: 06/19/2019 04:48   Dg Chest 2 View  Result Date: 06/18/2019 CLINICAL DATA:  Shortness of breath EXAM: CHEST - 2 VIEW COMPARISON:  02/04/2019 FINDINGS: Small right-sided pleural effusion. Cardiomegaly with vascular congestion. Hazy atelectasis at the bases. No pneumothorax. IMPRESSION: 1. Cardiomegaly with vascular congestion 2. Small right-sided pleural effusion and hazy atelectasis at the right base. Electronically Signed   By: Donavan Foil M.D.   On: 06/18/2019 21:37   US Abdomen Limited  Addendum Date: 06/19/2019   ADDENDUM REPORT: 06/19/2019 00:59 ADDENDUM: New images were obtained. The gallbladder appears to be decompressed with shadowing echogenic stones the largest measuring 1.4 cm. The gallbladder wall measures 2.7 mm. No pericholecystic fluid. In the body of the report the lesion mentioned should be hyperechoic, as on prior exam and likely hemangioma. IMPRESSION: Decompressed gallbladder with cholelithiasis, the largest stone measuring 1.4 mm. Electronically Signed   By: Prudencio Pair M.D.   On: 06/19/2019 00:59   Result Date: 06/19/2019 CLINICAL DATA:   Cholangitis EXAM: ULTRASOUND ABDOMEN LIMITED RIGHT UPPER QUADRANT COMPARISON:  None. FINDINGS: Gallbladder: The patient is status post cholecystectomy. Common bile duct: Diameter: 7 mm Liver: Within the right hepatic lobe there is a hypoechoic lesion measuring 1.1 x 1.0 x 1.4 cm. There is mild intrahepatic biliary ductal dilatation. There is a slightly heterogeneous appearance to the liver parenchyma. There is question of a slightly nodular contour of the liver parenchyma. Trace perihepatic ascites seen. Portal vein is patent on color Doppler imaging with normal direction of blood flow towards the liver. Other: None. IMPRESSION: Trace perihepatic ascites. Mild intrahepatic biliary ductal dilatation. Status post cholecystectomy. Slightly heterogeneous liver parenchyma with slightly nodular contour. This is non-specific could be due to mild cirrhosis. Electronically Signed: By: Prudencio Pair M.D. On: 06/19/2019 00:13    Anti-infectives: Anti-infectives (From admission, onward)   Start     Dose/Rate Route Frequency Ordered Stop   06/19/19 0600  piperacillin-tazobactam (ZOSYN) IVPB 3.375 g     3.375 g 12.5 mL/hr over 240 Minutes Intravenous Every 8 hours 06/19/19 0150     06/18/19 2330  piperacillin-tazobactam (ZOSYN) IVPB 3.375 g     3.375 g 100 mL/hr over 30 Minutes Intravenous  Once 06/18/19 2322 06/19/19 0058  Assessment/Plan  MMP  Choledocholithiasis -GI has evaluated the patient and will defer plan for ERCP to them. -the patient has no evidence of cholecystitis and would not recommend lap chole to follow ERCP, despite the risk of recurrent choledocholithiasis, and secondary to his significant cardiac risk with an operation -if he were to redevelop cholecystitis, we would  recommend a perc chole drain; however, there is no evidence, once again, of this currently, so does not need this now.  -no acute surgical needs.  We will sign off.   LOS: 0 days    Henreitta Cea , [name] Surgery 06/19/2019, 11:05 AM Please see Amion for pager number during day hours 7:00am-4:30pm

## 2019-06-19 NOTE — ED Notes (Signed)
Notified MD via telephone that patient had 4 beat run of vtach captured on the monitor. New orders received.

## 2019-06-19 NOTE — Interval H&P Note (Signed)
History and Physical Interval Note:  06/19/2019 1:07 PM  [name]  has presented today for surgery, with the diagnosis of Choledocholithiasis..  The various methods of treatment have been discussed with the patient and family. After consideration of risks, benefits and other options for treatment, the patient has consented to  Procedure(s): ENDOSCOPIC RETROGRADE CHOLANGIOPANCREATOGRAPHY (ERCP) WITH PROPOFOL (N/A) SPHINCTEROTOMY REMOVAL OF STONES as a surgical intervention.  The patient's history has been reviewed, patient examined, no change in status, stable for surgery.  I have reviewed the patient's chart and labs.  Questions were answered to the patient's satisfaction.     Milus Banister

## 2019-06-19 NOTE — H&P (Addendum)
TRH H&P    Patient Demographics:    [name], is a 78 y.o. male  MRN: [id number]  DOB - Aug 12, 1941  Admit Date - 06/18/2019  Referring MD/NP/PA: Charlann Lange  Outpatient Primary MD for the patient is [name], MD  Patient coming from:  home  Chief complaint-  Abdominal pain    HPI:    [name]  is a 79 y.o. 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 Constant,  Pt is not sure if worse with food. Pt had n/v x1, but no blood.  Pt denies fever, chills, cough, cp, palp, sob, diarrhea, constipation, brbpr, black stool, dysuria, hematuria.  Pt has chronic catheter due to ? Bph.  Pt presented due to worsening abdominal pain. Pt denies etoh use for the past 4 months.   In ED,  T 98.1, P 118, R 22, Bp 129/92, pox 100% on RA WT 90 kg  RUQ ultrasound  IMPRESSION: Decompressed gallbladder with cholelithiasis, the largest stone measuring 1.4 mm.  CXR IMPRESSION: 1. Cardiomegaly with vascular congestion 2. Small right-sided pleural effusion and hazy atelectasis at the right base.  Na 133, K 4.1, Bun 69, Creatinine 3.09 Ast 31, Alt 37, Alk phos 89, T. Bili 13.4 Glucose 127 Wbc 9.9, Hgb 12.1, Plt 226 BNP 2,536 Urinalysis wbc >50, rbc 6-10  Pt will be admitted for abdominal pain, ? Cholecystitis    Review of systems:    In addition to the HPI above,  No Fever-chills, No Headache, No changes with Vision or hearing, No problems swallowing food or Liquids, No Chest pain, Cough or Shortness of Breath,  No Blood in stool or Urine, No dysuria, No new skin rashes or bruises, No new joints pains-aches,  No new weakness, tingling, numbness in any extremity, No recent weight  gain or loss, No polyuria, polydypsia or polyphagia, No significant Mental Stressors.  All other systems reviewed and are negative.    Past History of the following :    Past Medical History:  Diagnosis Date  . Acute cholecystitis 11/23/2017  . Acute respiratory failure with hypoxia ([place])   . Acute upper back pain   . AKI (acute kidney injury) ([place])   . Alcohol abuse 02/26/2015  . Anemia, deficiency   . Back pain    LUMBOSACRAL  . Bronchitis, complicated   . Cardiomegaly   . Chronic combined systolic and diastolic CHF (congestive heart failure) ([place])   . Chronic pain    HIGH RISK MED USE COMPREHENSIVE HIGH RISK  . CKD (chronic kidney disease), stage III   . Demand ischemia ([place])    a. minimally elevated troponin peak 0.11 in 2016, felt demand ischemia. Cath offered but pt wished to have done back in [place].  . Diabetes mellitus without complication ([place])   . Edema, leg   . Elevated troponin 02/26/2015  .  Essential hypertension   . Fatigue   . Heart abnormality   . Hyperlipidemia   . Hypertension   . Neck pain, acute   . PVD (peripheral vascular disease) ([place])   . Renal lesion 11/23/2017  . SOB (shortness of breath) on exertion   . Solitary kidney   . Vitamin D deficiency       Past Surgical History:  Procedure Laterality Date  . IR CHOLANGIOGRAM EXISTING TUBE  12/27/2017  . IR PERC CHOLECYSTOSTOMY  11/25/2017  . IR RADIOLOGIST EVAL & MGMT  02/02/2018      Social History:      Social History   Tobacco Use  . Smoking status: Former Smoker    Types: Cigarettes    Quit date: 02/26/2008    Years since quitting: 11.3  . Smokeless tobacco: Never Used  Substance Use Topics  . Alcohol use: Yes    Comment: occ       Family History :     Family History  Problem Relation Age of Onset  . Other Mother        died in her sleep at 53  . Hypertension Sister   . Hyperlipidemia Sister   . Diabetes Mellitus II Sister   . Heart disease Sister   . CAD Neg Hx        neg  hx premature CAD       Home Medications:   Prior to Admission medications   Medication Sig Start Date End Date Taking? Authorizing Provider  aspirin EC 81 MG tablet Take 81 mg by mouth daily.    [provider]  carvedilol (COREG) 12.5 MG tablet Take 1 tablet (12.5 mg total) by mouth 2 (two) times daily. Patient taking differently: Take 6.25 mg by mouth 2 (two) times daily.  03/08/18 02/04/19  Daune Perch, NP  ENTRESTO 24-26 MG Take 1 tablet by mouth 2 (two) times daily. 01/16/19   [provider]  finasteride (PROSCAR) 5 MG tablet Take 1 tablet (5 mg total) by mouth daily. 09/03/18   Fawze, Mina A, PA-C  furosemide (LASIX) 40 MG tablet Take 3 tablets (120 mg total) by mouth daily. 02/12/19   [name], MD  Multiple Vitamin (MULTIVITAMIN) tablet Take 1 tablet by mouth daily.    [provider]  tamsulosin (FLOMAX) 0.4 MG CAPS capsule Take 1 capsule (0.4 mg total) by mouth daily. 09/03/18   Fawze, Mina A, PA-C     Allergies:    No Known Allergies   Physical Exam:   Vitals  Blood pressure 116/77, pulse (!) 101, temperature 99.8 F (37.7 C), temperature source Rectal, resp. rate (!) 25, height _0  (1.803 m), weight 90 kg, SpO2 99 %.  1.  General:  axoxo3  2. Psychiatric: euthymic  3. Neurologic: cn2-12 intact, reflexes 2+ symmetric, diffuse with no clonus, motor 5/5 in all 4ext  4. HEENMT:  Icteric, pupils 1.57m symmetric, direct, consensual, near intact, + arcus senilis Neck: no jvd  5. Respiratory : CTAB  6. Cardiovascular : rrr s1, s2,   7. Gastrointestinal:  Abd: soft, sligthly tender, no guarding, no rebound, +bs  8. Skin:  Ext: no c/c/e,  Dry skin, onychomycosis,   9.Musculoskeletal:  Good ROM     Data Review:    CBC Recent Labs  Lab 06/18/19 2054  WBC 9.9  HGB 12.1*  HCT 36.3*  PLT 226  MCV 88.3  MCH 29.4  MCHC 33.3  RDW 17.1*    ------------------------------------------------------------------------------------------------------------------  Results  for orders placed or performed during the hospital encounter of 06/18/19 (from the past 48 hour(s))  Lipase, blood     Status: Abnormal   Collection Time: 06/18/19  8:54 PM  Result Value Ref Range   Lipase 220 (H) 11 - 51 U/L    Comment: Performed at [facility], [place] [address], [place], [place] [address]  Comprehensive metabolic panel     Status: Abnormal   Collection Time: 06/18/19  8:54 PM  Result Value Ref Range   Sodium 133 (L) 135 - 145 mmol/L   Potassium 4.1 3.5 - 5.1 mmol/L   Chloride 98 98 - 111 mmol/L   CO2 20 (L) 22 - 32 mmol/L   Glucose, Bld 127 (H) 70 - 99 mg/dL   BUN 69 (H) 8 - 23 mg/dL   Creatinine, Ser 3.09 (H) 0.61 - 1.24 mg/dL   Calcium 8.5 (L) 8.9 - 10.3 mg/dL   Total Protein 7.7 6.5 - 8.1 g/dL   Albumin 2.5 (L) 3.5 - 5.0 g/dL   AST 31 15 - 41 U/L   ALT 37 0 - 44 U/L   Alkaline Phosphatase 89 38 - 126 U/L   Total Bilirubin 13.4 (H) 0.3 - 1.2 mg/dL   GFR calc non Af Amer 19 (L) >60 mL/min   GFR calc Af Amer 22 (L) >60 mL/min   Anion gap 15 5 - 15    Comment: Performed at [facility], [place] [address], [place], [place] [address]  CBC     Status: Abnormal   Collection Time: 06/18/19  8:54 PM  Result Value Ref Range   WBC 9.9 4.0 - 10.5 K/uL   RBC 4.11 (L) 4.22 - 5.81 MIL/uL   Hemoglobin 12.1 (L) 13.0 - 17.0 g/dL   HCT 36.3 (L) 39.0 - 52.0 %   MCV 88.3 80.0 - 100.0 fL   MCH 29.4 26.0 - 34.0 pg   MCHC 33.3 30.0 - 36.0 g/dL   RDW 17.1 (H) 11.5 - 15.5 %   Platelets 226 150 - 400 K/uL   nRBC 0.0 0.0 - 0.2 %    Comment: Performed at [facility], [place] [address], [place], [place] [address]  Brain natriuretic peptide     Status: Abnormal   Collection Time: 06/18/19  9:09 PM  Result Value Ref Range   B Natriuretic Peptide 2,536.0 (H) 0.0 - 100.0 pg/mL    Comment: Performed at [facility] [address],  [place], [place] [address]  Urinalysis, Routine w reflex microscopic     Status: Abnormal   Collection Time: 06/18/19 11:05 PM  Result Value Ref Range   Color, Urine AMBER (A) YELLOW    Comment: BIOCHEMICALS MAY BE AFFECTED BY COLOR   APPearance CLOUDY (A) CLEAR   Specific Gravity, Urine 1.017 1.005 - 1.030   pH 5.0 5.0 - 8.0   Glucose, UA NEGATIVE NEGATIVE mg/dL   Hgb urine dipstick MODERATE (A) NEGATIVE   Bilirubin Urine MODERATE (A) NEGATIVE   Ketones, ur NEGATIVE NEGATIVE mg/dL   Protein, ur 100 (A) NEGATIVE mg/dL   Nitrite NEGATIVE NEGATIVE   Leukocytes,Ua LARGE (A) NEGATIVE   RBC / HPF 6-10 0 - 5 RBC/hpf   WBC, UA >50 (H) 0 - 5 WBC/hpf   Bacteria, UA MANY (A) NONE SEEN   Hyaline Casts, UA PRESENT     Comment: Performed at [facility], [address], [place],  [address]  Protime-INR  Status: Abnormal   Collection Time: 06/19/19 12:36 AM  Result Value Ref Range   Prothrombin Time 17.6 (H) 11.4 - 15.2 seconds   INR 1.5 (H) 0.8 - 1.2    Comment: (NOTE) INR goal varies based on device and disease states. Performed at [facility], [place] [address], [place], [place] [address]     Chemistries  Recent Labs  Lab 06/18/19 2054  NA 133*  K 4.1  CL 98  CO2 20*  GLUCOSE 127*  BUN 69*  CREATININE 3.09*  CALCIUM 8.5*  AST 31  ALT 37  ALKPHOS 89  BILITOT 13.4*   ------------------------------------------------------------------------------------------------------------------  ------------------------------------------------------------------------------------------------------------------ GFR: Estimated Creatinine Clearance: 22 mL/min (A) (by C-G formula based on SCr of 3.09 mg/dL (H)). Liver Function Tests: Recent Labs  Lab 06/18/19 2054  AST 31  ALT 37  ALKPHOS 89  BILITOT 13.4*  PROT 7.7  ALBUMIN 2.5*   Recent Labs  Lab 06/18/19 2054  LIPASE 220*   No results for input(s): AMMONIA in the last 168 hours. Coagulation Profile: Recent Labs   Lab 06/19/19 0036  INR 1.5*   Cardiac Enzymes: No results for input(s): CKTOTAL, CKMB, CKMBINDEX, TROPONINI in the last 168 hours. BNP (last 3 results) No results for input(s): PROBNP in the last 8760 hours. HbA1C: No results for input(s): HGBA1C in the last 72 hours. CBG: No results for input(s): GLUCAP in the last 168 hours. Lipid Profile: No results for input(s): CHOL, HDL, LDLCALC, TRIG, CHOLHDL, LDLDIRECT in the last 72 hours. Thyroid Function Tests: No results for input(s): TSH, T4TOTAL, FREET4, T3FREE, THYROIDAB in the last 72 hours. Anemia Panel: No results for input(s): VITAMINB12, FOLATE, FERRITIN, TIBC, IRON, RETICCTPCT in the last 72 hours.  --------------------------------------------------------------------------------------------------------------- Urine analysis:    Component Value Date/Time   COLORURINE AMBER (A) 06/18/2019 2305   APPEARANCEUR CLOUDY (A) 06/18/2019 2305   LABSPEC 1.017 06/18/2019 2305   PHURINE 5.0 06/18/2019 2305   GLUCOSEU NEGATIVE 06/18/2019 2305   HGBUR MODERATE (A) 06/18/2019 2305   BILIRUBINUR MODERATE (A) 06/18/2019 [place] 06/18/2019 2305   PROTEINUR 100 (A) 06/18/2019 2305   NITRITE NEGATIVE 06/18/2019 2305   LEUKOCYTESUR LARGE (A) 06/18/2019 2305      Imaging Results:    Dg Chest 2 View  Result Date: 06/18/2019 CLINICAL DATA:  Shortness of breath EXAM: CHEST - 2 VIEW COMPARISON:  02/04/2019 FINDINGS: Small right-sided pleural effusion. Cardiomegaly with vascular congestion. Hazy atelectasis at the bases. No pneumothorax. IMPRESSION: 1. Cardiomegaly with vascular congestion 2. Small right-sided pleural effusion and hazy atelectasis at the right base. Electronically Signed   By: Donavan Foil M.D.   On: 06/18/2019 21:37   US Abdomen Limited  Addendum Date: 06/19/2019   ADDENDUM REPORT: 06/19/2019 00:59 ADDENDUM: New images were obtained. The gallbladder appears to be decompressed with shadowing echogenic  stones the largest measuring 1.4 cm. The gallbladder wall measures 2.7 mm. No pericholecystic fluid. In the body of the report the lesion mentioned should be hyperechoic, as on prior exam and likely hemangioma. IMPRESSION: Decompressed gallbladder with cholelithiasis, the largest stone measuring 1.4 mm. Electronically Signed   By: Prudencio Pair M.D.   On: 06/19/2019 00:59   Result Date: 06/19/2019 CLINICAL DATA:  Cholangitis EXAM: ULTRASOUND ABDOMEN LIMITED RIGHT UPPER QUADRANT COMPARISON:  None. FINDINGS: Gallbladder: The patient is status post cholecystectomy. Common bile duct: Diameter: 7 mm Liver: Within the right hepatic lobe there is a hypoechoic lesion measuring 1.1 x 1.0 x 1.4 cm. There is mild  intrahepatic biliary ductal dilatation. There is a slightly heterogeneous appearance to the liver parenchyma. There is question of a slightly nodular contour of the liver parenchyma. Trace perihepatic ascites seen. Portal vein is patent on color Doppler imaging with normal direction of blood flow towards the liver. Other: None. IMPRESSION: Trace perihepatic ascites. Mild intrahepatic biliary ductal dilatation. Status post cholecystectomy. Slightly heterogeneous liver parenchyma with slightly nodular contour. This is non-specific could be due to mild cirrhosis. Electronically Signed: By: Prudencio Pair M.D. On: 06/19/2019 00:13    ST at 100, Lad, nl int, q in v1-3,  st elevation in v2-4 (old)   Assessment & Plan:    Active Problems:   Hyperlipidemia   Diabetes mellitus without complication (HCC)   CKD (chronic kidney disease), stage III (HCC)   Chronic combined systolic and diastolic heart failure (HCC)   Essential hypertension   ARF (acute renal failure) (HCC)   Abdominal pain   Acute lower UTI  Abdominal pain, r/o cholecystitis Check CT scan abd/ pelvis NPO Dilaudid 0.563m iv q4h prn  Zofran 421miv q6h prn  Zosyn iv pharmacy to dose ED called surgery , [place], no surgical intervention for now  Please consult surgery in am  Acute lower Uti Urine culture Zosyn as above  Abnormal liver function (elevated T. Bili) Check acute hepatitis panel Check LDH, check direct/ indirect bili  ARF on CKD stage3 Hydrate with ns iv very gently Check cmp in am  H/o CHF (EF 25%), PVD Cont carvedilol 12.63m90mo bid Cont Entresto 24/76m64m bid Cont Lasix 40mg92mqday Cont Aspirin 81mg 37mday  Dm2 fsbs q4h, ISS  Anemia Check cbc in am  BPH Cont Flomax Cont Finasteride  DVT Prophylaxis-   SCDs  AM Labs Ordered, also please review Full Orders  Family Communication: Admission, patients condition and plan of care including tests being ordered have been discussed with the patient  who indicate understanding and agree with the plan and Code Status.  Code Status:  FULL CODE per patient, left message for sister that pt will be [facility]  [facility]: Based on patients clinical presentation and evaluation of above clinical data, I have made determination that patient meets Inpatient criteria at this time.  Pt might have cholecystitis,  Pt will require iv abx, and has hx of sepsis with cholecystitis, pt has high risk of clinical decline.  Pt will be admitted for > 2 nites stay  Time spent in minutes : 70 minutes    Jani Graveln 06/19/2019 at 1:53 AM

## 2019-06-19 NOTE — ED Notes (Signed)
Patient transported to CT 

## 2019-06-19 NOTE — ED Notes (Signed)
Pt had emesis and choking while trying to swallow daily medications. Will notify MD, keep patient NPO and order swallow eval.

## 2019-06-19 NOTE — Progress Notes (Signed)
Pharmacy Antibiotic Note  [name] is a 78 y.o. male admitted on 06/18/2019 with intra-abdominal infection.  Pharmacy has been consulted for Zosyn dosing.  Plan: Zosyn 3.375g IV q8h (4-hour infusion).  Height: 5\' 11"  (180.3 cm) Weight: 198 lb 6.6 oz (90 kg)(from June 2020 records) IBW/kg (Calculated) : 75.3  Temp (24hrs), Avg:98.3 F (36.8 C), Min:97.1 F (36.2 C), Max:99.8 F (37.7 C)  Recent Labs  Lab 06/18/19 2054  WBC 9.9  CREATININE 3.09*    Estimated Creatinine Clearance: 22 mL/min (A) (by C-G formula based on SCr of 3.09 mg/dL (H)).    No Known Allergies   Thank you for allowing pharmacy to be a part of this patient's care.  Wynona Neat, PharmD, BCPS  06/19/2019 1:49 AM

## 2019-06-19 NOTE — Anesthesia Procedure Notes (Signed)
Procedure Name: Intubation Date/Time: 06/19/2019 12:07 PM Performed by: Cleda Daub, CRNA Pre-anesthesia Checklist: Patient identified, Emergency Drugs available, Suction available and Patient being monitored Patient Re-evaluated:Patient Re-evaluated prior to induction Oxygen Delivery Method: Circle system utilized Preoxygenation: Pre-oxygenation with 100% oxygen Induction Type: IV induction Ventilation: Mask ventilation without difficulty and Mask ventilation throughout procedure Laryngoscope Size: Miller and 2 Grade View: Grade I Tube type: Oral Tube size: 7.5 mm Number of attempts: 1 Airway Equipment and Method: Stylet Placement Confirmation: ETT inserted through vocal cords under direct vision,  positive ETCO2 and breath sounds checked- equal and bilateral Secured at: 24 cm Tube secured with: Tape Dental Injury: Teeth and Oropharynx as per pre-operative assessment

## 2019-06-19 NOTE — Consult Note (Signed)
Cardiology Consultation:   Patient ID: [name] MRN: [id number]; DOB: 01-Jan-1941  Admit date: 06/18/2019 Date of Consult: 06/19/2019  Primary Care Provider: [name], MD Primary Cardiologist: Candee Furbish, MD  Primary Electrophysiologist:  Thompson Grayer, MD    Patient Profile:   [name] is a 78 y.o. male with a hx of hypertension, hyperlipidemia, diabetes, chronic combined CHF, CKD stage III, solitary kidney, EtOH abuse who is being seen today for the evaluation of CHF at the request of Dr. Marylyn Ishihara.   History of Present Illness:   [name] is a 78 year old male with past medical history noted above.  Seen back in June 2016 where at the time he was visiting from [place].  During this admission a 2D echocardiogram showed EF of 25 to 30% with diffuse hypokinesis, grade 1 diastolic dysfunction, mild mitral regurgitation, moderately dilated left atrium and normal RV.  Option of cardiac cath was offered to the patient during that admission but he expressed preference to have this done back in [place] instead.  Was seen again in March 2019 when he presented with 2 days of severe abdominal pain.  Imaging showed gallstones with acute cholecystitis with concern for pyelonephritis.  Notes indicate during this admission he had not been taking any of his cardiac medications with the exception of aspirin.  He had not undergone further ischemic work-up when traveling back to [place] and was treated medically.  Cardiology was called for preoperative evaluation.  It was felt at that time he would be very high risk from a cardiovascular standpoint.  He underwent PERC drain placement.  In regards to his heart failure he was started on carvedilol 6.25 mg twice daily with plans to see him back as an outpatient to further titrate CHF meds.  According to notes the [place] drain was removed several months later at the patient's request.  Also has a chronic urinary catheter  as he is not a candidate to undergo prostate surgery.  Does not appear that he had any cardiology follow-up after this admission.  Notes indicate that he is possibly followed by Dr. Claudie Leach through [facility], but patient states he may have had one visit over the past year.  He presented to the ED on 06/18/2027 with abdominal pain, right upper quadrant pain and nausea and vomiting.  Stated his symptoms started in the lower abdomen but migrated to the right upper quadrant.  CT abd/pelvis showed choledocholithiasis. Surgery was consulted and felt given no concern for acute cholecystitis they would not recommend lap chol. If he were to develop, then plan to replaced perc drain. GI consulted and underwent ERCP today with noted cholangitis, recommendation for IV antibiotics for another 48 hrs.   Other labs on admission showed stable electrolytes, Cr 3.09>>2.88>>3.26, BNP 2500, Hgb 12.1. EKG showed ST with LVH, LAFB and nonspecific ST/TW changes. CXR with small right sided pleural effusion with hazy atelectasis.   He does report some shortness of breath prior to admission and lower extremity edema.  States he has been compliant with home Lasix dose and has had good urine output prior to admission.  No chest pain.  Of note he is somewhat difficult to get a good story as he is still lethargic from prior ERCP today.   Heart Pathway Score:     Past Medical History:  Diagnosis Date   Acute cholecystitis 11/23/2017   Acute respiratory failure with hypoxia (HCC)    Acute upper back pain  AKI (acute kidney injury) ([facility])    Alcohol abuse 02/26/2015   Anemia, deficiency    Back pain    LUMBOSACRAL   Bronchitis, complicated    Cardiomegaly    Chronic combined systolic and diastolic CHF (congestive heart failure) (HCC)    Chronic pain    HIGH RISK MED USE COMPREHENSIVE HIGH RISK   CKD (chronic kidney disease), stage III    Demand ischemia ([facility])    a. minimally elevated troponin  peak 0.11 in 2016, felt demand ischemia. Cath offered but pt wished to have done back in [place].   Diabetes mellitus without complication (HCC)    Edema, leg    Elevated troponin 02/26/2015   Essential hypertension    Fatigue    Heart abnormality    Hyperlipidemia    Hypertension    Neck pain, acute    PVD (peripheral vascular disease) (HCC)    Renal lesion 11/23/2017   SOB (shortness of breath) on exertion    Solitary kidney    Vitamin D deficiency     Past Surgical History:  Procedure Laterality Date   IR CHOLANGIOGRAM EXISTING TUBE  12/27/2017   IR PERC CHOLECYSTOSTOMY  11/25/2017   IR RADIOLOGIST EVAL & MGMT  02/02/2018     Home Medications:  Prior to Admission medications   Medication Sig Start Date End Date Taking? Authorizing Provider  carvedilol (COREG) 12.5 MG tablet Take 1 tablet (12.5 mg total) by mouth 2 (two) times daily. Patient taking differently: Take 6.25 mg by mouth 2 (two) times daily.  03/08/18 06/19/19 Yes Daune Perch, NP  finasteride (PROSCAR) 5 MG tablet Take 1 tablet (5 mg total) by mouth daily. 09/03/18  Yes Fawze, Mina A, PA-C  furosemide (LASIX) 40 MG tablet Take 3 tablets (120 mg total) by mouth daily. Patient taking differently: Take 40 mg by mouth daily.  02/12/19  Yes [name], MD    Inpatient Medications: Scheduled Meds:  aspirin EC  81 mg Oral Daily   carvedilol  6.25 mg Oral BID WC   finasteride  5 mg Oral Daily   furosemide  120 mg Oral Daily   indomethacin  100 mg Rectal Once   insulin aspart  0-9 Units Subcutaneous Q4H   multivitamin with minerals  1 tablet Oral Daily   sacubitril-valsartan  1 tablet Oral BID   sodium chloride flush  3 mL Intravenous Once   tamsulosin  0.4 mg Oral Daily   Continuous Infusions:  piperacillin-tazobactam (ZOSYN)  IV 3.375 g (06/19/19 1353)   PRN Meds: HYDROmorphone (DILAUDID) injection, ondansetron (ZOFRAN) IV  Allergies:   No Known Allergies  Social History:   Social  History   Socioeconomic History   Marital status: Single    Spouse name: Not on file   Number of children: Not on file   Years of education: Not on file   Highest education level: Not on file  Occupational History   Not on file  Social Needs   Financial resource strain: Not on file   Food insecurity    Worry: Not on file    Inability: Not on file   Transportation needs    Medical: Not on file    Non-medical: Not on file  Tobacco Use   Smoking status: Former Smoker    Types: Cigarettes    Quit date: 02/26/2008    Years since quitting: 11.3   Smokeless tobacco: Never Used  Substance and Sexual Activity   Alcohol use: Yes    Comment:  occ   Drug use: No   Sexual activity: Not on file  Lifestyle   Physical activity    Days per week: Not on file    Minutes per session: Not on file   Stress: Not on file  Relationships   Social connections    Talks on phone: Not on file    Gets together: Not on file    Attends religious service: Not on file    Active member of club or organization: Not on file    Attends meetings of clubs or organizations: Not on file    Relationship status: Not on file   Intimate partner violence    Fear of current or ex partner: Not on file    Emotionally abused: Not on file    Physically abused: Not on file    Forced sexual activity: Not on file  Other Topics Concern   Not on file  Social History Narrative   Not on file    Family History:    Family History  Problem Relation Age of Onset   Other Mother        died in her sleep at 37   Hypertension Sister    Hyperlipidemia Sister    Diabetes Mellitus II Sister    Heart disease Sister    CAD Neg Hx        neg hx premature CAD     ROS:  Please see the history of present illness.   All other ROS reviewed and negative.     Physical Exam/Data:   Vitals:   06/19/19 1347 06/19/19 1348 06/19/19 1400 06/19/19 1415  BP:  112/69 107/71 103/70  Pulse: (!) 101  99 100    Resp: 12  15 17   Temp:      TempSrc:      SpO2: 98%  98% 98%  Weight:      Height:        Intake/Output Summary (Last 24 hours) at 06/19/2019 1502 Last data filed at 06/19/2019 1310 Gross per 24 hour  Intake 150 ml  Output 1 ml  Net 149 ml   Last 3 Weights 06/19/2019 06/19/2019 02/12/2019  Weight (lbs) 198 lb 6.6 oz 198 lb 6.6 oz 190 lb  Weight (kg) 90 kg 90 kg 86.183 kg  Some encounter information is confidential and restricted. Go to Review Flowsheets activity to see all data.     Body mass index is 27.67 kg/m.  General:  Well nourished, well developed, older AAM in no acute distress HEENT: normal Neck: no JVD Endocrine:  No thryomegaly Vascular: No carotid bruits Cardiac:  normal S1, S2; RRR; no murmur  Lungs:  clear to auscultation bilaterally, no wheezing, rhonchi or rales  Abd: soft, but tender, no hepatomegaly  Ext: trace LE edema  Musculoskeletal:  No deformities, BUE and BLE strength normal and equal Skin: warm and dry  Neuro:  CNs 2-12 intact, no focal abnormalities noted Psych:  Normal affect   EKG:  The EKG was personally reviewed and demonstrates: ST with LVH, LAFB and nonspecific ST/TW changes.   Relevant CV Studies:  TTE: 10/2017  Study Conclusions  - Left ventricle: The cavity size was normal. Wall thickness was   increased in a pattern of moderate LVH. Systolic function was   severely reduced. The estimated ejection fraction was in the   range of 20% to 25%. Diffuse hypokinesis. The study is not   technically sufficient to allow evaluation of LV diastolic   function. -  Mitral valve: Mildly thickened leaflets . There was trivial   regurgitation. - Left atrium: The atrium was normal in size. - Right ventricle: The cavity size was mildly dilated. - Tricuspid valve: There was trivial regurgitation. - Pulmonary arteries: PA peak pressure: 26 mm Hg (S). - Inferior vena cava: The vessel was dilated. The respirophasic   diameter changes were blunted  (< 50%), consistent with elevated   central venous pressure.  Impressions:  - Compared to a prior study in 2016, the LVEF is slightly lower at   20-25%.  Laboratory Data:  High Sensitivity Troponin:  No results for input(s): TROPONINIHS in the last 720 hours.   Chemistry Recent Labs  Lab 06/18/19 2054 06/19/19 0819 06/19/19 1413  NA 133* 136 137  K 4.1 4.0 4.1  CL 98 100 102  CO2 20* 21* 21*  GLUCOSE 127* 120* 118*  BUN 69* 73* 77*  CREATININE 3.09* 2.88* 3.26*  CALCIUM 8.5* 8.5* 8.4*  GFRNONAA 19* 20* 18*  GFRAA 22* 24* 20*  ANIONGAP 15 15 14     Recent Labs  Lab 06/18/19 2054 06/19/19 0819 06/19/19 1413  PROT 7.7 6.9  --   ALBUMIN 2.5* 2.2* 2.2*  AST 31 25  --   ALT 37 30  --   ALKPHOS 89 70  --   BILITOT 13.4* 14.8*  --    Hematology Recent Labs  Lab 06/18/19 2054 06/19/19 0819  WBC 9.9 9.2  RBC 4.11* 4.07*  HGB 12.1* 11.7*  HCT 36.3* 35.6*  MCV 88.3 87.5  MCH 29.4 28.7  MCHC 33.3 32.9  RDW 17.1* 17.0*  PLT 226 211   BNP Recent Labs  Lab 06/18/19 2109  BNP 2,536.0*    DDimer No results for input(s): DDIMER in the last 168 hours.   Radiology/Studies:  Ct Abdomen Pelvis Wo Contrast  Result Date: 06/19/2019 CLINICAL DATA:  Nausea and vomiting with right upper quadrant pain EXAM: CT ABDOMEN AND PELVIS WITHOUT CONTRAST TECHNIQUE: Multidetector CT imaging of the abdomen and pelvis was performed following the standard protocol without IV contrast. COMPARISON:  11/23/2017 FINDINGS: Lower chest: Small right more than left pleural effusion. Chronic cardiomegaly. Hepatobiliary: Small hepatic low densities also seen on prior.New bile duct dilatation that is both intra and extrahepatic with a 13 mm stone at the level of the distal common bile duct. There are likely additional more subtly calcified calculi in the distal CBD. Subtly cholelithiasis within the collapsed gallbladder evaluated by ultrasound earlier today. Pancreas: No convincing pancreatitis.  Spleen: Unremarkable. Adrenals/Urinary Tract: Negative adrenals. 4 mm high-density right renal lesion, nonspecific without contrast. Bladder is decompressed by Foley catheter. Stomach/Bowel: No obstruction. No evidence of bowel inflammation. Predominantly distal colonic diverticulosis. Vascular/Lymphatic: No acute vascular abnormality. Extensive aortic and iliac atherosclerosis. No mass or adenopathy. Reproductive:Moderate ascites with pockets of high-density in the lower right flank and pelvis. Other: Anasarca and generalized retroperitoneal edema. Musculoskeletal: Expansion with cortical sclerosis and thickening of the left ilium, pagetoid and stable. Lower lumbar facet arthropathy and disc narrowing with L4-5 spinal stenosis. These results were called by telephone at the time of interpretation on 06/19/2019 at 4:46 am to provider [place], who verbally acknowledged these results. IMPRESSION: 1. Biliary dilatation due to choledocholithiasis. A 13 mm stone is present at the distal common bile duct with probable additional less calcified calculi. 2. Unexpectedly collapsed gallbladder given the degree of biliary dilatation. There is history of cholecystostomy and there is moderate complex ascites (possible hemoperitoneum) question leakage through the cholecystotomy. 3. Cardiomegaly  with anasarca and small pleural effusions. Electronically Signed   By: Monte Fantasia M.D.   On: 06/19/2019 04:48   Dg Chest 2 View  Result Date: 06/18/2019 CLINICAL DATA:  Shortness of breath EXAM: CHEST - 2 VIEW COMPARISON:  02/04/2019 FINDINGS: Small right-sided pleural effusion. Cardiomegaly with vascular congestion. Hazy atelectasis at the bases. No pneumothorax. IMPRESSION: 1. Cardiomegaly with vascular congestion 2. Small right-sided pleural effusion and hazy atelectasis at the right base. Electronically Signed   By: Donavan Foil M.D.   On: 06/18/2019 21:37   US Abdomen Limited  Addendum Date: 06/19/2019   ADDENDUM  REPORT: 06/19/2019 00:59 ADDENDUM: New images were obtained. The gallbladder appears to be decompressed with shadowing echogenic stones the largest measuring 1.4 cm. The gallbladder wall measures 2.7 mm. No pericholecystic fluid. In the body of the report the lesion mentioned should be hyperechoic, as on prior exam and likely hemangioma. IMPRESSION: Decompressed gallbladder with cholelithiasis, the largest stone measuring 1.4 mm. Electronically Signed   By: Prudencio Pair M.D.   On: 06/19/2019 00:59   Result Date: 06/19/2019 CLINICAL DATA:  Cholangitis EXAM: ULTRASOUND ABDOMEN LIMITED RIGHT UPPER QUADRANT COMPARISON:  None. FINDINGS: Gallbladder: The patient is status post cholecystectomy. Common bile duct: Diameter: 7 mm Liver: Within the right hepatic lobe there is a hypoechoic lesion measuring 1.1 x 1.0 x 1.4 cm. There is mild intrahepatic biliary ductal dilatation. There is a slightly heterogeneous appearance to the liver parenchyma. There is question of a slightly nodular contour of the liver parenchyma. Trace perihepatic ascites seen. Portal vein is patent on color Doppler imaging with normal direction of blood flow towards the liver. Other: None. IMPRESSION: Trace perihepatic ascites. Mild intrahepatic biliary ductal dilatation. Status post cholecystectomy. Slightly heterogeneous liver parenchyma with slightly nodular contour. This is non-specific could be due to mild cirrhosis. Electronically Signed: By: Prudencio Pair M.D. On: 06/19/2019 00:13   Dg Ercp Biliary & Pancreatic Ducts  Result Date: 06/19/2019 CLINICAL DATA:  ERCP.  Choledocholithiasis. EXAM: ERCP TECHNIQUE: Multiple spot images obtained with the fluoroscopic device and submitted for interpretation post-procedure. COMPARISON:  CT abdomen pelvis-06/19/2019; image guided cholecystostomy tube placement-11/25/2017; cholangiogram via existing cholecystostomy tube-11/30/2017 FLUOROSCOPY TIME:  2 minutes, 34 seconds FINDINGS: Two spot intraoperative  fluoroscopic images during ERCP are provided for review Initial image demonstrates an ERCP probe overlying the right upper abdominal quadrant. There is selective cannulation and opacification of common bile duct which appears moderately dilated. There are several ill-defined filling defects seen within the CBD worrisome for choledocholithiasis. Completion image demonstrates apparent interval stone extraction with persistent dilatation of the CBD. There is faint opacification of the cystic duct and gallbladder lumen. No definitive opacification of the pancreatic duct. IMPRESSION: ERCP with findings of choledocholithiasis. These images were submitted for radiologic interpretation only. Please see the procedural report for the amount of contrast and the fluoroscopy time utilized. Electronically Signed   By: Sandi Mariscal M.D.   On: 06/19/2019 13:31    Assessment and Plan:   [name] is a 78 y.o. male with a hx of hypertension, hyperlipidemia, diabetes, chronic combined CHF, CKD stage III, solitary kidney, EtOH abuse who is being seen today for the evaluation of CHF at the request of Dr. Marylyn Ishihara.   1. Chronic combined CHF: EF noted at 25 to 30% back in 2016.  Does not appear that he has had any formal ischemic evaluation to determine whether this is ICM or NICM.  Does have a long history of EtOH use, but also risk  factors for CAD.  He has only been on Coreg 6.25 milligrams twice daily prior to admission, states unable to afford Entresto.  BNP is elevated > 2500, but lower than it was several months ago (4000).  Chest x-ray with small right pleural effusion.  Discussions have been had about possible ischemic evaluation in the past, but this has been deferred, and patient has not followed up in the office for further evaluation.  -- would benefit from further work up to determine cause of cardiomyopathy. Not a candidate for cath at this time given his renal disease. Consider lexiscan myoview.  -- with renal  disease, will stop Entresto, and dose reduce lasix to 40mg  daily ( what he reported prior to admission). He also reported being unable to afford Entresto in the past.  -- will attempt to obtain records from Dr. Claudie Leach   2. Choledocholithiasis: evaluated by Surgery and GI. Underwent ERCP today with noted cholangitis. Recommendations to continue on IV antibiotics for the next 48 hrs. Surgery in favor of replacing perc drain if recurrence of cholecystitis.  3. CKD stage III with solitary kidney: baseline around 2.5. Up at 3.26 today. -- stop Entresto, reduce lasix to 40mg  daily  4. HTN: on coreg 6.25mg  BID prior to admission. Blood pressures have been soft. Would titrate as appropriate.   5. NSVT: had long 20 beat run during interview. Asymptomatic. K+ and Mag ok. On BB as above.   6. Chronic indwelling foley: reports his UOP has been good prior to admission.  For questions or updates, please contact [facility] Please consult [email or web address] for contact info under   Signed, Reino Bellis, NP  06/19/2019 3:02 PM

## 2019-06-19 NOTE — Anesthesia Preprocedure Evaluation (Addendum)
Anesthesia Evaluation  Patient identified by MRN, date of birth, ID band Patient awake    Reviewed: Allergy & Precautions, NPO status , Patient's Chart, lab work & pertinent test results, reviewed documented beta blocker date and time   Airway Mallampati: II       Dental  (+) Poor Dentition,    Pulmonary former smoker,     + decreased breath sounds      Cardiovascular hypertension, Pt. on home beta blockers  Rhythm:Regular Rate:Tachycardia     Neuro/Psych    GI/Hepatic Neg liver ROS,   Endo/Other  diabetes  Renal/GU ARFRenal disease     Musculoskeletal negative musculoskeletal ROS (+)   Abdominal Normal abdominal exam  (+)   Peds  Hematology  (+) anemia ,   Anesthesia Other Findings Study Conclusions  - Left ventricle: The cavity size was normal. Wall thickness was   increased in a pattern of moderate LVH. Systolic function was   severely reduced. The estimated ejection fraction was in the   range of 20% to 25%. Diffuse hypokinesis. The study is not   technically sufficient to allow evaluation of LV diastolic   function. - Mitral valve: Mildly thickened leaflets . There was trivial   regurgitation. - Left atrium: The atrium was normal in size. - Right ventricle: The cavity size was mildly dilated. - Tricuspid valve: There was trivial regurgitation. - Pulmonary arteries: PA peak pressure: 26 mm Hg (S). - Inferior vena cava: The vessel was dilated. The respirophasic   diameter changes were blunted (< 50%), consistent with elevated   central venous pressure.  Impressions:  - Compared to a prior study in 2016, the LVEF is slightly lower at   20-25%.   Reproductive/Obstetrics                            Anesthesia Physical Anesthesia Plan  ASA: III  Anesthesia Plan: General   Post-op Pain Management:    Induction: Intravenous  PONV Risk Score and Plan: 2 and Ondansetron  and Dexamethasone  Airway Management Planned: Oral ETT  Additional Equipment: None  Intra-op Plan:   Post-operative Plan: Extubation in OR and Possible Post-op intubation/ventilation  Informed Consent: I have reviewed the patients History and Physical, chart, labs and discussed the procedure including the risks, benefits and alternatives for the proposed anesthesia with the patient or authorized representative who has indicated his/her understanding and acceptance.     Dental advisory given  Plan Discussed with: CRNA  Anesthesia Plan Comments:         Anesthesia Quick Evaluation

## 2019-06-19 NOTE — ED Notes (Signed)
Please call pt's sister [name] at [phone] to give her an update on pt.

## 2019-06-19 NOTE — ED Notes (Signed)
Per 6N; wait til room is clean to give report.

## 2019-06-19 NOTE — ED Notes (Signed)
ED TO INPATIENT HANDOFF REPORT  ED Nurse Name and Phone #: William Hamburger, RN [phone]  S Name/Age/Gender [name] 78 y.o. male Room/Bed: 638G/536I  Code Status   Code Status: Full Code  Home/SNF/Other Home Patient oriented to: self, place, time and situation Is this baseline? No   Triage Complete: Triage complete  Chief Complaint Jaundice [W80] Periumbilical abdominal pain [R10.33] AKI (acute kidney injury) ([place]) [N17.9]  Triage Note Pt c/o abd pain x 2 days with constipation and decreased appetite. EMS reports taking apple cider vinegar with worsened pain. Also noted jaundice to eyes with family reported to EMS was new. Hx of CHF, took extra lasix on Saturday with increased urinary output; BLE edema noted. EMS placed pt on oxygen for comfort.   Theador Hawthorne, niece, to be contacted [phone]   Allergies No Known Allergies  Level of Care/Admitting Diagnosis ED Disposition    ED Disposition Condition [place]: [facility] [100100]  Level of Care: Telemetry Medical [104]  Covid Evaluation: Asymptomatic Screening Protocol (No Symptoms)  Diagnosis: Abdominal pain [037048]  Admitting Physician: Jani Gravel [[id number]]  Attending Physician: Jani Gravel [phone]  Estimated length of stay: past midnight tomorrow  Certification:: I certify this patient will need inpatient services for at least 2 midnights  PT Class (Do Not Modify): Inpatient [101]  PT Acc Code (Do Not Modify): Private [1]       B Medical/Surgery History Past Medical History:  Diagnosis Date  . Acute cholecystitis 11/23/2017  . Acute respiratory failure with hypoxia ([place])   . Acute upper back pain   . AKI (acute kidney injury) ([place])   . Alcohol abuse 02/26/2015  . Anemia, deficiency   . Back pain    LUMBOSACRAL  . Bronchitis, complicated   . Cardiomegaly   . Chronic combined systolic and diastolic CHF (congestive heart failure) ([place])   . Chronic pain    HIGH RISK  MED USE COMPREHENSIVE HIGH RISK  . CKD (chronic kidney disease), stage III   . Demand ischemia ([place])    a. minimally elevated troponin peak 0.11 in 2016, felt demand ischemia. Cath offered but pt wished to have done back in [place].  . Diabetes mellitus without complication ([place])   . Edema, leg   . Elevated troponin 02/26/2015  . Essential hypertension   . Fatigue   . Heart abnormality   . Hyperlipidemia   . Hypertension   . Neck pain, acute   . PVD (peripheral vascular disease) ([place])   . Renal lesion 11/23/2017  . SOB (shortness of breath) on exertion   . Solitary kidney   . Vitamin D deficiency    Past Surgical History:  Procedure Laterality Date  . IR CHOLANGIOGRAM EXISTING TUBE  12/27/2017  . IR PERC CHOLECYSTOSTOMY  11/25/2017  . IR RADIOLOGIST EVAL & MGMT  02/02/2018     A IV Location/Drains/Wounds Patient Lines/Drains/Airways Status   Active Line/Drains/Airways    Name:   Placement date:   Placement time:   Site:   Days:   Peripheral IV 06/18/19 Right Antecubital   06/18/19    2303    Antecubital   1   Biliary Tube Other (Comment) 10 Fr. RUQ   11/25/17    0900    RUQ   571   Urethral Catheter Tori Philipps RN Cou[place] 22 Fr.   01/18/19    6945    Coude   152  Intake/Output Last 24 hours  Intake/Output Summary (Last 24 hours) at 06/19/2019 1956 Last data filed at 06/19/2019 1738 Gross per 24 hour  Intake 150 ml  Output 121 ml  Net 29 ml    Labs/Imaging Results for orders placed or performed during the hospital encounter of 06/18/19 (from the past 48 hour(s))  Lipase, blood     Status: Abnormal   Collection Time: 06/18/19  8:54 PM  Result Value Ref Range   Lipase 220 (H) 11 - 51 U/L    Comment: Performed at [facility], [place] [address], [place], [place] [address]  Comprehensive metabolic panel     Status: Abnormal   Collection Time: 06/18/19  8:54 PM  Result Value Ref Range   Sodium 133 (L) 135 - 145 mmol/L   Potassium 4.1 3.5 - 5.1 mmol/L   Chloride  98 98 - 111 mmol/L   CO2 20 (L) 22 - 32 mmol/L   Glucose, Bld 127 (H) 70 - 99 mg/dL   BUN 69 (H) 8 - 23 mg/dL   Creatinine, Ser 3.09 (H) 0.61 - 1.24 mg/dL   Calcium 8.5 (L) 8.9 - 10.3 mg/dL   Total Protein 7.7 6.5 - 8.1 g/dL   Albumin 2.5 (L) 3.5 - 5.0 g/dL   AST 31 15 - 41 U/L   ALT 37 0 - 44 U/L   Alkaline Phosphatase 89 38 - 126 U/L   Total Bilirubin 13.4 (H) 0.3 - 1.2 mg/dL   GFR calc non Af Amer 19 (L) >60 mL/min   GFR calc Af Amer 22 (L) >60 mL/min   Anion gap 15 5 - 15    Comment: Performed at [facility], [place] [address], [place], [place] [address]  CBC     Status: Abnormal   Collection Time: 06/18/19  8:54 PM  Result Value Ref Range   WBC 9.9 4.0 - 10.5 K/uL   RBC 4.11 (L) 4.22 - 5.81 MIL/uL   Hemoglobin 12.1 (L) 13.0 - 17.0 g/dL   HCT 36.3 (L) 39.0 - 52.0 %   MCV 88.3 80.0 - 100.0 fL   MCH 29.4 26.0 - 34.0 pg   MCHC 33.3 30.0 - 36.0 g/dL   RDW 17.1 (H) 11.5 - 15.5 %   Platelets 226 150 - 400 K/uL   nRBC 0.0 0.0 - 0.2 %    Comment: Performed at [facility], [place] [address] [place], [place] [address]  Brain natriuretic peptide     Status: Abnormal   Collection Time: 06/18/19  9:09 PM  Result Value Ref Range   B Natriuretic Peptide 2,536.0 (H) 0.0 - 100.0 pg/mL    Comment: Performed at [place] [address], [place], [place] [address]  Urinalysis, Routine w reflex microscopic     Status: Abnormal   Collection Time: 06/18/19 11:05 PM  Result Value Ref Range   Color, Urine AMBER (A) YELLOW    Comment: BIOCHEMICALS MAY BE AFFECTED BY COLOR   APPearance CLOUDY (A) CLEAR   Specific Gravity, Urine 1.017 1.005 - 1.030   pH 5.0 5.0 - 8.0   Glucose, UA NEGATIVE NEGATIVE mg/dL   Hgb urine dipstick MODERATE (A) NEGATIVE   Bilirubin Urine MODERATE (A) NEGATIVE   Ketones, ur NEGATIVE NEGATIVE mg/dL   Protein, ur 100 (A) NEGATIVE mg/dL   Nitrite NEGATIVE NEGATIVE   Leukocytes,Ua LARGE (A) NEGATIVE   RBC / HPF 6-10 0 - 5 RBC/hpf   WBC, UA >50 (H) 0  -  5 WBC/hpf   Bacteria, UA MANY (A) NONE SEEN   Hyaline Casts, UA PRESENT     Comment: Performed at [facility], [place] [address] [place], [place] [address]  SARS Coronavirus 2 by RT PCR (hospital order, performed in [facility] hospital lab) Nasopharyngeal Nasopharyngeal Swab     Status: None   Collection Time: 06/18/19 11:25 PM   Specimen: Nasopharyngeal Swab  Result Value Ref Range   SARS Coronavirus 2 NEGATIVE NEGATIVE    Comment: (NOTE) If result is NEGATIVE SARS-CoV-2 target nucleic acids are NOT DETECTED. The SARS-CoV-2 RNA is generally detectable in upper and lower  respiratory specimens during the acute phase of infection. The lowest  concentration of SARS-CoV-2 viral copies this assay can detect is 250  copies / mL. A negative result does not preclude SARS-CoV-2 infection  and should not be used as the sole basis for treatment or other  patient management decisions.  A negative result may occur with  improper specimen collection / handling, submission of specimen other  than nasopharyngeal swab, presence of viral mutation(s) within the  areas targeted by this assay, and inadequate number of viral copies  (<250 copies / mL). A negative result must be combined with clinical  observations, patient history, and epidemiological information. If result is POSITIVE SARS-CoV-2 target nucleic acids are DETECTED. The SARS-CoV-2 RNA is generally detectable in upper and lower  respiratory specimens dur ing the acute phase of infection.  Positive  results are indicative of active infection with SARS-CoV-2.  Clinical  correlation with patient history and other diagnostic information is  necessary to determine patient infection status.  Positive results do  not rule out bacterial infection or co-infection with other viruses. If result is PRESUMPTIVE POSTIVE SARS-CoV-2 nucleic acids MAY BE PRESENT.   A presumptive positive result was obtained on the submitted specimen  and confirmed  on repeat testing.  While 2019 novel coronavirus  (SARS-CoV-2) nucleic acids may be present in the submitted sample  additional confirmatory testing may be necessary for epidemiological  and / or clinical management purposes  to differentiate between  SARS-CoV-2 and other Sarbecovirus currently known to infect humans.  If clinically indicated additional testing with an alternate test  methodology [phone]) is advised. The SARS-CoV-2 RNA is generally  detectable in upper and lower respiratory sp ecimens during the acute  phase of infection. The expected result is Negative. Fact Sheet for Patients:  StrictlyIdeas.no Fact Sheet for Healthcare Providers: BankingDealers.co.za This test is not yet approved or cleared by the [place] FDA and has been authorized for detection and/or diagnosis of SARS-CoV-2 by FDA under an Emergency Use Authorization (EUA).  This EUA will remain in effect (meaning this test can be used) for the duration of the COVID-19 declaration under Section 564(b)(1) of the Act, 21 U.S.C. section 360bbb-3(b)(1), unless the authorization is terminated or revoked sooner. Performed at [facility], [place] [address], [place], [place] [address]   Protime-INR     Status: Abnormal   Collection Time: 06/19/19 12:36 AM  Result Value Ref Range   Prothrombin Time 17.6 (H) 11.4 - 15.2 seconds   INR 1.5 (H) 0.8 - 1.2    Comment: (NOTE) INR goal varies based on device and disease states. Performed at [facility], [place] [address] [place], [place] [address]   CBG monitoring, ED     Status: Abnormal   Collection Time: 06/19/19  3:48 AM  Result Value Ref Range   Glucose-Capillary 113 (  H) 70 - 99 mg/dL  CBG monitoring, ED     Status: Abnormal   Collection Time: 06/19/19  7:50 AM  Result Value Ref Range   Glucose-Capillary 113 (H) 70 - 99 mg/dL   Comment 1 Notify RN    Comment 2 Document in Chart   Comprehensive  metabolic panel     Status: Abnormal   Collection Time: 06/19/19  8:19 AM  Result Value Ref Range   Sodium 136 135 - 145 mmol/L   Potassium 4.0 3.5 - 5.1 mmol/L   Chloride 100 98 - 111 mmol/L   CO2 21 (L) 22 - 32 mmol/L   Glucose, Bld 120 (H) 70 - 99 mg/dL   BUN 73 (H) 8 - 23 mg/dL   Creatinine, Ser 2.88 (H) 0.61 - 1.24 mg/dL   Calcium 8.5 (L) 8.9 - 10.3 mg/dL   Total Protein 6.9 6.5 - 8.1 g/dL   Albumin 2.2 (L) 3.5 - 5.0 g/dL   AST 25 15 - 41 U/L   ALT 30 0 - 44 U/L   Alkaline Phosphatase 70 38 - 126 U/L   Total Bilirubin 14.8 (H) 0.3 - 1.2 mg/dL   GFR calc non Af Amer 20 (L) >60 mL/min   GFR calc Af Amer 24 (L) >60 mL/min   Anion gap 15 5 - 15    Comment: Performed at [facility], [address], [place], [place] [address]  CBC     Status: Abnormal   Collection Time: 06/19/19  8:19 AM  Result Value Ref Range   WBC 9.2 4.0 - 10.5 K/uL   RBC 4.07 (L) 4.22 - 5.81 MIL/uL   Hemoglobin 11.7 (L) 13.0 - 17.0 g/dL   HCT 35.6 (L) 39.0 - 52.0 %   MCV 87.5 80.0 - 100.0 fL   MCH 28.7 26.0 - 34.0 pg   MCHC 32.9 30.0 - 36.0 g/dL   RDW 17.0 (H) 11.5 - 15.5 %   Platelets 211 150 - 400 K/uL   nRBC 0.2 0.0 - 0.2 %    Comment: Performed at [facility], [place] [address] [place], [place] [address]  Lactate dehydrogenase     Status: None   Collection Time: 06/19/19  8:19 AM  Result Value Ref Range   LDH 127 98 - 192 U/L    Comment: Performed at [facility], [place] [address], [place],  [address]  CBG monitoring, ED     Status: Abnormal   Collection Time: 06/19/19  2:11 PM  Result Value Ref Range   Glucose-Capillary 113 (H) 70 - 99 mg/dL  Renal function panel     Status: Abnormal   Collection Time: 06/19/19  2:13 PM  Result Value Ref Range   Sodium 137 135 - 145 mmol/L   Potassium 4.1 3.5 - 5.1 mmol/L   Chloride 102 98 - 111 mmol/L   CO2 21 (L) 22 - 32 mmol/L   Glucose, Bld 118 (H) 70 - 99 mg/dL   BUN 77 (H) 8 - 23 mg/dL   Creatinine, Ser 3.26 (H) 0.61 - 1.24  mg/dL   Calcium 8.4 (L) 8.9 - 10.3 mg/dL   Phosphorus 5.9 (H) 2.5 - 4.6 mg/dL   Albumin 2.2 (L) 3.5 - 5.0 g/dL   GFR calc non Af Amer 18 (L) >60 mL/min   GFR calc Af Amer 20 (L) >60 mL/min   Anion gap 14 5 - 15    Comment: Performed at [facility], [address] [place], [place] [address]  Magnesium     Status: None   Collection Time: 06/19/19  2:13 PM  Result Value Ref Range   Magnesium 2.1 1.7 - 2.4 mg/dL    Comment: Performed at [facility], [place] [address] [place], [place] [address]  CBG monitoring, ED     Status: Abnormal   Collection Time: 06/19/19  5:24 PM  Result Value Ref Range   Glucose-Capillary 110 (H) 70 - 99 mg/dL   Ct Abdomen Pelvis Wo Contrast  Result Date: 06/19/2019 CLINICAL DATA:  Nausea and vomiting with right upper quadrant pain EXAM: CT ABDOMEN AND PELVIS WITHOUT CONTRAST TECHNIQUE: Multidetector CT imaging of the abdomen and pelvis was performed following the standard protocol without IV contrast. COMPARISON:  11/23/2017 FINDINGS: Lower chest: Small right more than left pleural effusion. Chronic cardiomegaly. Hepatobiliary: Small hepatic low densities also seen on prior.New bile duct dilatation that is both intra and extrahepatic with a 13 mm stone at the level of the distal common bile duct. There are likely additional more subtly calcified calculi in the distal CBD. Subtly cholelithiasis within the collapsed gallbladder evaluated by ultrasound earlier today. Pancreas: No convincing pancreatitis. Spleen: Unremarkable. Adrenals/Urinary Tract: Negative adrenals. 4 mm high-density right renal lesion, nonspecific without contrast. Bladder is decompressed by Foley catheter. Stomach/Bowel: No obstruction. No evidence of bowel inflammation. Predominantly distal colonic diverticulosis. Vascular/Lymphatic: No acute vascular abnormality. Extensive aortic and iliac atherosclerosis. No mass or adenopathy. Reproductive:Moderate ascites with pockets of high-density in  the lower right flank and pelvis. Other: Anasarca and generalized retroperitoneal edema. Musculoskeletal: Expansion with cortical sclerosis and thickening of the left ilium, pagetoid and stable. Lower lumbar facet arthropathy and disc narrowing with L4-5 spinal stenosis. These results were called by telephone at the time of interpretation on 06/19/2019 at 4:46 am to provider [place], who verbally acknowledged these results. IMPRESSION: 1. Biliary dilatation due to choledocholithiasis. A 13 mm stone is present at the distal common bile duct with probable additional less calcified calculi. 2. Unexpectedly collapsed gallbladder given the degree of biliary dilatation. There is history of cholecystostomy and there is moderate complex ascites (possible hemoperitoneum) question leakage through the cholecystotomy. 3. Cardiomegaly with anasarca and small pleural effusions. Electronically Signed   By: Monte Fantasia M.D.   On: 06/19/2019 04:48   Dg Chest 2 View  Result Date: 06/18/2019 CLINICAL DATA:  Shortness of breath EXAM: CHEST - 2 VIEW COMPARISON:  02/04/2019 FINDINGS: Small right-sided pleural effusion. Cardiomegaly with vascular congestion. Hazy atelectasis at the bases. No pneumothorax. IMPRESSION: 1. Cardiomegaly with vascular congestion 2. Small right-sided pleural effusion and hazy atelectasis at the right base. Electronically Signed   By: Donavan Foil M.D.   On: 06/18/2019 21:37   US Abdomen Limited  Addendum Date: 06/19/2019   ADDENDUM REPORT: 06/19/2019 00:59 ADDENDUM: New images were obtained. The gallbladder appears to be decompressed with shadowing echogenic stones the largest measuring 1.4 cm. The gallbladder wall measures 2.7 mm. No pericholecystic fluid. In the body of the report the lesion mentioned should be hyperechoic, as on prior exam and likely hemangioma. IMPRESSION: Decompressed gallbladder with cholelithiasis, the largest stone measuring 1.4 mm. Electronically Signed   By: Prudencio Pair M.D.   On: 06/19/2019 00:59   Result Date: 06/19/2019 CLINICAL DATA:  Cholangitis EXAM: ULTRASOUND ABDOMEN LIMITED RIGHT UPPER QUADRANT COMPARISON:  None. FINDINGS: Gallbladder: The patient is status post cholecystectomy. Common bile duct: Diameter: 7 mm Liver: Within the right hepatic lobe there is a hypoechoic  lesion measuring 1.1 x 1.0 x 1.4 cm. There is mild intrahepatic biliary ductal dilatation. There is a slightly heterogeneous appearance to the liver parenchyma. There is question of a slightly nodular contour of the liver parenchyma. Trace perihepatic ascites seen. Portal vein is patent on color Doppler imaging with normal direction of blood flow towards the liver. Other: None. IMPRESSION: Trace perihepatic ascites. Mild intrahepatic biliary ductal dilatation. Status post cholecystectomy. Slightly heterogeneous liver parenchyma with slightly nodular contour. This is non-specific could be due to mild cirrhosis. Electronically Signed: By: Prudencio Pair M.D. On: 06/19/2019 00:13   Dg Ercp Biliary & Pancreatic Ducts  Result Date: 06/19/2019 CLINICAL DATA:  ERCP.  Choledocholithiasis. EXAM: ERCP TECHNIQUE: Multiple spot images obtained with the fluoroscopic device and submitted for interpretation post-procedure. COMPARISON:  CT abdomen pelvis-06/19/2019; image guided cholecystostomy tube placement-11/25/2017; cholangiogram via existing cholecystostomy tube-11/30/2017 FLUOROSCOPY TIME:  2 minutes, 34 seconds FINDINGS: Two spot intraoperative fluoroscopic images during ERCP are provided for review Initial image demonstrates an ERCP probe overlying the right upper abdominal quadrant. There is selective cannulation and opacification of common bile duct which appears moderately dilated. There are several ill-defined filling defects seen within the CBD worrisome for choledocholithiasis. Completion image demonstrates apparent interval stone extraction with persistent dilatation of the CBD. There is faint  opacification of the cystic duct and gallbladder lumen. No definitive opacification of the pancreatic duct. IMPRESSION: ERCP with findings of choledocholithiasis. These images were submitted for radiologic interpretation only. Please see the procedural report for the amount of contrast and the fluoroscopy time utilized. Electronically Signed   By: Sandi Mariscal M.D.   On: 06/19/2019 13:31    Pending Labs Unresulted Labs (From admission, onward)    Start     Ordered   06/19/19 0117  Urine culture  ONCE - STAT,   AD     06/19/19 0116   06/19/19 0117  Culture, blood (routine x 2)  BLOOD CULTURE X 2,   STAT     06/19/19 0116   06/18/19 2344  Brain natriuretic peptide  Add-on,   AD     06/18/19 2343          Vitals/Pain Today's Vitals   06/19/19 1730 06/19/19 1745 06/19/19 1830 06/19/19 1900  BP: 109/69 113/75 107/67 117/67  Pulse: (!) 101 97 93 97  Resp: 11 14 12 18   Temp:      TempSrc:      SpO2: 99% 98% 100% 100%  Weight:      Height:      PainSc:        Isolation Precautions No active isolations  Medications Medications  sodium chloride flush (NS) 0.9 % injection 3 mL ( Intravenous MAR Unhold 06/19/19 1345)  0.9 %  sodium chloride infusion ( Intravenous New Bag/Given 06/19/19 0208)  piperacillin-tazobactam (ZOSYN) IVPB 3.375 g (0 g Intravenous Stopped 06/19/19 1753)  insulin aspart (novoLOG) injection 0-9 Units (0 Units Subcutaneous Not Given 06/19/19 1730)  HYDROmorphone (DILAUDID) injection 0.5 mg ( Intravenous MAR Unhold 06/19/19 1345)  ondansetron (ZOFRAN) injection 4 mg ( Intravenous MAR Unhold 06/19/19 1345)  aspirin EC tablet 81 mg ( Oral MAR Unhold 06/19/19 1345)  finasteride (PROSCAR) tablet 5 mg ( Oral MAR Unhold 06/19/19 1345)  tamsulosin (FLOMAX) capsule 0.4 mg ( Oral MAR Unhold 06/19/19 1345)  multivitamin with minerals tablet 1 tablet ( Oral MAR Unhold 06/19/19 1345)  indomethacin (INDOCIN) 50 MG suppository 100 mg (100 mg Rectal Not Given 06/19/19 1415)   carvedilol (COREG) tablet 6.25  mg (6.25 mg Oral Given 06/19/19 1744)  furosemide (LASIX) tablet 40 mg (has no administration in time range)  HYDROmorphone (DILAUDID) injection 0.5 mg (0.5 mg Intravenous Given 06/18/19 2332)  piperacillin-tazobactam (ZOSYN) IVPB 3.375 g (0 g Intravenous Stopped 06/19/19 0058)  glucagon (human recombinant) (GLUCAGEN) 1 MG injection (has no administration in time range)  ciprofloxacin (CIPRO) 400 MG/200ML IVPB (has no administration in time range)  indomethacin (INDOCIN) 50 MG suppository (has no administration in time range)  ondansetron (ZOFRAN) 4 MG/2ML injection (has no administration in time range)    Mobility walks with person assist Moderate fall risk   Focused Assessments Renal Assessment Handoff:  Hemodialysis Schedule:  Last Hemodialysis date and time:    Restricted appendage:       R Recommendations: See Admitting Provider Note  Report given to:   Additional Notes:

## 2019-06-19 NOTE — Progress Notes (Addendum)
78 yo M presenting with abdominal pain. pMN admission. See H&P for full details. CT result should biliary dilation secondary to stone. Patient has history of GB stones treated with cholecystomy in 2019. Patient states that for the most part he has done well since that was removed. Given current findings, I have spoken with GI. They will see him today. Renal function fairly stable. Continue gentle fluids. Continue zosyn, pain control. Continue as per H&P.    Abdominal pain, r/o cholecystitis Elevated T Bili     - CT results noted     - GI consulted; general surgery is onboard     - continue zosyn, zofran diluadid  Acute UTI     - UCx pending     - Zosyn as above  ARF on CKD stage3     - Hydrate with ns iv very gently  H/o HFrEF (EF 25%), PVD     - Cont carvedilol, Entresto, Lasix, Aspirin     - BNP 2500 but this is an improvement from last     - anasarca noted on CT     - consult HF team  DMt2     - fsbs q4h, ISS  BPH     - Cont Flomax, Finasteride  General: 78 y.o. male resting in bed in NAD Cardiovascular: RRR, +S1, S2, no m/g/r Respiratory: CTABL, no w/r/r, normal WOB GI: BS+, distended, slight TTP RUQ/RLQ, no masses noted MSK: No e/c/c Neuro: A&O x 3, no focal deficits Psyc: Appropriate interaction and affect, calm/cooperative  Jonnie Finner, DO

## 2019-06-19 NOTE — Op Note (Signed)
[facility] Patient Name: [name] Procedure Date : 06/19/2019 MRN: [id number] Attending MD: Milus Banister , MD Date of Birth: 06/12/1941 CSN: [id number] Age: 78 Admit Type: Outpatient Procedure:                ERCP Indications:              Common bile duct stone(s) on CT, 2019 acute                            cholecystitis treated with temporary GB drain only                            given his significant comorbid conditions and high                            surgical risks Providers:                Milus Banister, MD, Vista Lawman, RN, William Dalton, Technician Referring MD:              Medicines:                General Anesthesia, Indomethacin 100 mg PR, zosyn IV Complications:            No immediate complications. Estimated Blood Loss:     Estimated blood loss: none. Procedure:                Pre-Anesthesia Assessment:                           - Prior to the procedure, a History and Physical                            was performed, and patient medications and                            allergies were reviewed. The patient's tolerance of                            previous anesthesia was also reviewed. The risks                            and benefits of the procedure and the sedation                            options and risks were discussed with the patient.                            All questions were answered, and informed consent                            was obtained. Prior Anticoagulants: The patient has  taken no previous anticoagulant or antiplatelet                            agents. ASA Grade Assessment: IV - A patient with                            severe systemic disease that is a constant threat                            to life. After reviewing the risks and benefits,                            the patient was deemed in satisfactory condition to   undergo the procedure.                           After obtaining informed consent, the scope was                            passed under direct vision. Throughout the                            procedure, the patient's blood pressure, pulse, and                            oxygen saturations were monitored continuously. The                            [id number]-Q180V ([id number] Olympus Duodensocope was                            introduced through the mouth, and used to inject                            contrast into and used to inject contrast into the                            bile duct. The ERCP was accomplished without                            difficulty. The patient tolerated the procedure                            well. Scope In: Scope Out: Findings:      Scout film was normal. A duodenoscope was advanced to the region of the       major papilla without detailed examination of the UGI tract. There was       distal gastric edema that caused minor difficulty, tortuosity in       advancing the scope into the duodenum. The major papilla was bulbous,       swollen and draining purulence even prior to any manipulation. A 44       Autotome over a .035 hydrajag was used to cannulate the bile duct and       contrast was injected.  Cholangiogram revealed several medium sized       mobile filling defects throughout the CBD, CHD. The extrahepatic biliary       tree was diffusely dilated (CBD 48mm) without evident strictures. The       cystic duct partially opacified, the gallbladder did not opacify. I       performed an adequate biliary sphincterotomy over the wire and then       swept the duct several times with a 9-75mm biliary retrieval balloon.       This delivered copious pus and several 4-47mm brown stones into the       duodenum. A completion occlusion cholangiogram showed no remaining       biliary filling defects and the scope was removed. The main pancreatic       duct was never cannulated  with the wire or injected with dye. Impression:               - Choledocholithiasis and purulent cholangitis were                            found and treated with a biliary sphincterotomy and                            balloon sweeping.                           - The cystic duct partially opacified but the GB                            never opacified. Recommendation:           - Return patient to hospital ward for ongoing care.                           - Should continue IV antibiotics for another 48                            hours given the purulent cholangitis.                           - Should be alert for signs of cholecystitis given                            non-opacification of the gallbladder. Procedure Code(s):        --- Professional ---                           [id number], Endoscopic retrograde                            cholangiopancreatography (ERCP); with removal of                            calculi/debris from biliary/pancreatic duct(s)                           43262, Endoscopic retrograde  cholangiopancreatography (ERCP); with                            sphincterotomy/papillotomy Diagnosis Code(s):        --- Professional ---                           K80.50, Calculus of bile duct without cholangitis                            or cholecystitis without obstruction CPT copyright 2019 [employer]. All rights reserved. The codes documented in this report are preliminary and upon coder review may  be revised to meet current compliance requirements. Milus Banister, MD 06/19/2019 1:07:09 PM This report has been signed electronically. Number of Addenda: 0

## 2019-06-19 NOTE — Transfer of Care (Signed)
Immediate Anesthesia Transfer of Care Note  Patient: [name]  Procedure(s) Performed: ENDOSCOPIC RETROGRADE CHOLANGIOPANCREATOGRAPHY (ERCP) WITH PROPOFOL (N/A ) SPHINCTEROTOMY REMOVAL OF STONES  Patient Location: PACU  Anesthesia Type:General  Level of Consciousness: awake, alert , oriented and patient cooperative  Airway & Oxygen Therapy: Patient Spontanous Breathing and Patient connected to face mask oxygen  Post-op Assessment: Report given to RN and Post -op Vital signs reviewed and stable  Post vital signs: Reviewed and stable  Last Vitals:  Vitals Value Taken Time  BP 120/76 06/19/19 1304  Temp 36.1 C 06/19/19 1304  Pulse 102 06/19/19 1310  Resp 8 06/19/19 1310  SpO2 100 % 06/19/19 1310  Vitals shown include unvalidated device data.  Last Pain:  Vitals:   06/19/19 1304  TempSrc: Temporal  PainSc:          Complications: No apparent anesthesia complications

## 2019-06-19 NOTE — ED Notes (Signed)
Provided patient w/ small sip of water via straw; successful. Pt swallow with no issues. Coreg given PO with no issues as well.

## 2019-06-20 ENCOUNTER — Inpatient Hospital Stay (HOSPITAL_COMMUNITY): Payer: Medicare Other

## 2019-06-20 ENCOUNTER — Encounter (HOSPITAL_COMMUNITY): Payer: Self-pay | Admitting: Gastroenterology

## 2019-06-20 DIAGNOSIS — I5043 Acute on chronic combined systolic (congestive) and diastolic (congestive) heart failure: Secondary | ICD-10-CM | POA: Diagnosis not present

## 2019-06-20 DIAGNOSIS — N179 Acute kidney failure, unspecified: Secondary | ICD-10-CM

## 2019-06-20 DIAGNOSIS — I10 Essential (primary) hypertension: Secondary | ICD-10-CM | POA: Diagnosis not present

## 2019-06-20 DIAGNOSIS — K8031 Calculus of bile duct with cholangitis, unspecified, with obstruction: Secondary | ICD-10-CM | POA: Diagnosis not present

## 2019-06-20 LAB — GLUCOSE, CAPILLARY
Glucose-Capillary: 104 mg/dL — ABNORMAL HIGH (ref 70–99)
Glucose-Capillary: 117 mg/dL — ABNORMAL HIGH (ref 70–99)
Glucose-Capillary: 121 mg/dL — ABNORMAL HIGH (ref 70–99)
Glucose-Capillary: 125 mg/dL — ABNORMAL HIGH (ref 70–99)
Glucose-Capillary: 132 mg/dL — ABNORMAL HIGH (ref 70–99)
Glucose-Capillary: 134 mg/dL — ABNORMAL HIGH (ref 70–99)

## 2019-06-20 LAB — COMPREHENSIVE METABOLIC PANEL
ALT: 22 U/L (ref 0–44)
AST: 19 U/L (ref 15–41)
Albumin: 1.8 g/dL — ABNORMAL LOW (ref 3.5–5.0)
Alkaline Phosphatase: 62 U/L (ref 38–126)
Anion gap: 15 (ref 5–15)
BUN: 90 mg/dL — ABNORMAL HIGH (ref 8–23)
CO2: 22 mmol/L (ref 22–32)
Calcium: 8.1 mg/dL — ABNORMAL LOW (ref 8.9–10.3)
Chloride: 100 mmol/L (ref 98–111)
Creatinine, Ser: 3.76 mg/dL — ABNORMAL HIGH (ref 0.61–1.24)
GFR calc Af Amer: 17 mL/min — ABNORMAL LOW (ref >60)
GFR calc non Af Amer: 15 mL/min — ABNORMAL LOW (ref >60)
Glucose, Bld: 146 mg/dL — ABNORMAL HIGH (ref 70–99)
Potassium: 4.2 mmol/L (ref 3.5–5.1)
Sodium: 137 mmol/L (ref 135–145)
Total Bilirubin: 12.8 mg/dL — ABNORMAL HIGH (ref 0.3–1.2)
Total Protein: 6.2 g/dL — ABNORMAL LOW (ref 6.5–8.1)

## 2019-06-20 LAB — CBC
HCT: 33.7 % — ABNORMAL LOW (ref 39.0–52.0)
Hemoglobin: 11.8 g/dL — ABNORMAL LOW (ref 13.0–17.0)
MCH: 29.6 pg (ref 26.0–34.0)
MCHC: 35 g/dL (ref 30.0–36.0)
MCV: 84.5 fL (ref 80.0–100.0)
Platelets: 184 10*3/uL (ref 150–400)
RBC: 3.99 MIL/uL — ABNORMAL LOW (ref 4.22–5.81)
RDW: 16.5 % — ABNORMAL HIGH (ref 11.5–15.5)
WBC: 10.5 10*3/uL (ref 4.0–10.5)
nRBC: 0 % (ref 0.0–0.2)

## 2019-06-20 MED ORDER — IOHEXOL 300 MG/ML  SOLN
75.0000 mL | Freq: Once | INTRAMUSCULAR | Status: AC | PRN
  Administered 2019-06-20: 75 mL via ORAL

## 2019-06-20 MED ORDER — ENSURE ENLIVE PO LIQD
237.0000 mL | Freq: Two times a day (BID) | ORAL | Status: DC
  Administered 2019-06-22 – 2019-07-02 (×4): 237 mL via ORAL

## 2019-06-20 MED ORDER — ALUM & MAG HYDROXIDE-SIMETH 200-200-20 MG/5ML PO SUSP
15.0000 mL | ORAL | Status: DC | PRN
  Administered 2019-06-20 – 2019-06-30 (×5): 15 mL via ORAL
  Filled 2019-06-20 (×5): qty 30

## 2019-06-20 MED ORDER — ONDANSETRON HCL 4 MG/2ML IJ SOLN
4.0000 mg | Freq: Four times a day (QID) | INTRAMUSCULAR | Status: DC | PRN
  Administered 2019-06-20: 4 mg via INTRAVENOUS

## 2019-06-20 MED ORDER — PROMETHAZINE HCL 25 MG PO TABS: 25.0000 mg | ORAL_TABLET | Freq: Four times a day (QID) | ORAL | Status: DC | PRN

## 2019-06-20 MED ORDER — SODIUM CHLORIDE 0.9 % IV SOLN
INTRAVENOUS | Status: DC
  Administered 2019-06-20: 14:00:00 via INTRAVENOUS

## 2019-06-20 MED ORDER — PROMETHAZINE HCL 25 MG RE SUPP
25.0000 mg | Freq: Four times a day (QID) | RECTAL | Status: DC | PRN
  Filled 2019-06-20: qty 1

## 2019-06-20 MED ORDER — PIPERACILLIN-TAZOBACTAM IN DEX 2-0.25 GM/50ML IV SOLN
2.2500 g | Freq: Three times a day (TID) | INTRAVENOUS | Status: DC
  Administered 2019-06-20 – 2019-06-22 (×6): 2.25 g via INTRAVENOUS
  Filled 2019-06-20 (×8): qty 50

## 2019-06-20 MED ORDER — PROMETHAZINE HCL 25 MG/ML IJ SOLN: 12.5000 mg | Freq: Four times a day (QID) | INTRAMUSCULAR | Status: DC | PRN

## 2019-06-20 NOTE — Progress Notes (Signed)
PROGRESS NOTE    [name]  GDJ:[id number] DOB: 05/14/1941 DOA: 06/18/2019 PCP: [name], MD      Brief Narrative:  [name] is a 78 y.o. M with HTN, sCHF EF 25%, CKD III baseline Cr 2 and alcohol use who presented with 4-5 days progressive abdominal pain with vomiting.  In the ER, US abdomen showed gallstones, was admitted for possible cholecystitis.     Assessment & Plan:   Acute choledocholithiasis and cholangitis Afebrile overnight, WBC 10.5K.  Blood cultures no growth.  -Continue Zosyn, day 3 -Advance diet -Consult GI -Consult Gen Surg   Abdominal distention Esophagram today showed no esophageal abnormalities, but incidentally did show what appears to be small bowel obstruction or ileus -Obtain CT abdomen and pelvis with contrast  AKI on CKD stage III Baseline creatinine is around 2, has trended up to 3-1/2 today. -Start IV fluids -Daily BMP -Hold Lasix -Avoid indomethacin  Chronic systolic CHF -Continue carvedilol -Continue aspirin -Hold Entresto, Lasix -May need palliative care consult  Diabetes Glucose is adequately controlled -Continue sliding scale corrections  BPH -Continue finasteride, Flomax        MDM and disposition: The below labs and imaging reports were reviewed and summarized above.  Medication management as above.  This is a severe acute illness with threat to life or bodily function.  He is worsening.  The patient was admitted with abdominal pain, found to have cholangitis.        DVT prophylaxis: SCDs Code Status: Full code Family Communication:     Consultants:   Cardiology  Gastroenterology  Procedures:   10/20 ERCP  Antimicrobials:   Zosyn 10/19 >>   Culture data:   10/20 blood culture x2 -- NGTD    Subjective: Patient is having eructation, nausea, regurgitation of food.  He has abdominal distention and abdominal pain.  He has had no fever, no right upper quadrant pain, no  confusion.  He has had no swelling, no respiratory distress, no orthopnea.  Objective: Vitals:   06/20/19 0334 06/20/19 0921 06/20/19 1422 06/20/19 1821  BP: 120/78 126/73 95/61 95/65   Pulse: 97 95 79 80  Resp: 20  20 17   Temp: 97.9 F (36.6 C)  (!) 97.4 F (36.3 C) 97.9 F (36.6 C)  TempSrc: Oral  Oral Oral  SpO2: 99%  100% 100%  Weight:      Height:        Intake/Output Summary (Last 24 hours) at 06/20/2019 1850 Last data filed at 06/20/2019 1700 Gross per 24 hour  Intake 290 ml  Output 225 ml  Net 65 ml   Filed Weights   06/19/19 0103 06/19/19 1128  Weight: 90 kg 90 kg    Examination: General appearance:  adult male, alert and in mild distress from nausea.  Interactive, sitting in recliner. HEENT: Anicteric, conjunctiva pink, lids and lashes normal. No nasal deformity, discharge, epistaxis.  Lips moist, dentition poor, oropharynx moist, no oral lesions, hearing normal.   Skin: Warm and dry.  no jaundice.  No suspicious rashes or lesions. Cardiac: RRR, nl S1-S2, no murmurs appreciated.  Capillary refill is brisk.  No JVD.  Trace brawny LE edema.  Radia  pulses 2+ and symmetric. Respiratory: Normal respiratory rate and rhythm.  CTAB without rales or wheezes. Abdomen: Abdomen soft.  Abdomen distended, diffuse nonfocal tenderness to palpation.  Bowel sounds present.  No ascites.   MSK: No deformities or effusions. Neuro: Awake and alert.  EOMI, moves all extremities. Speech fluent.  Psych: Sensorium intact and responding to questions, attention normal. Affect blunted  Judgment and insight appear normal.    Data Reviewed: I have personally reviewed following labs and imaging studies:  CBC: Recent Labs  Lab 06/18/19 2054 06/19/19 0819 06/20/19 0616  WBC 9.9 9.2 10.5  HGB 12.1* 11.7* 11.8*  HCT 36.3* 35.6* 33.7*  MCV 88.3 87.5 84.5  PLT 226 211 160   Basic Metabolic Panel: Recent Labs  Lab 06/18/19 2054 06/19/19 0819 06/19/19 1413 06/20/19 0616  NA 133*  136 137 137  K 4.1 4.0 4.1 4.2  CL 98 100 102 100  CO2 20* 21* 21* 22  GLUCOSE 127* 120* 118* 146*  BUN 69* 73* 77* 90*  CREATININE 3.09* 2.88* 3.26* 3.76*  CALCIUM 8.5* 8.5* 8.4* 8.1*  MG  --   --  2.1  --   PHOS  --   --  5.9*  --    GFR: Estimated Creatinine Clearance: 18.1 mL/min (A) (by C-G formula based on SCr of 3.76 mg/dL (H)). Liver Function Tests: Recent Labs  Lab 06/18/19 2054 06/19/19 0819 06/19/19 1413 06/20/19 0616  AST 31 25  --  19  ALT 37 30  --  22  ALKPHOS 89 70  --  62  BILITOT 13.4* 14.8*  --  12.8*  PROT 7.7 6.9  --  6.2*  ALBUMIN 2.5* 2.2* 2.2* 1.8*   Recent Labs  Lab 06/18/19 2054  LIPASE 220*   No results for input(s): AMMONIA in the last 168 hours. Coagulation Profile: Recent Labs  Lab 06/19/19 0036  INR 1.5*   Cardiac Enzymes: No results for input(s): CKTOTAL, CKMB, CKMBINDEX, TROPONINI in the last 168 hours. BNP (last 3 results) No results for input(s): PROBNP in the last 8760 hours. HbA1C: No results for input(s): HGBA1C in the last 72 hours. CBG: Recent Labs  Lab 06/19/19 2350 06/20/19 0329 06/20/19 0847 06/20/19 1244 06/20/19 1705  GLUCAP 132* 117* 134* 121* 125*   Lipid Profile: No results for input(s): CHOL, HDL, LDLCALC, TRIG, CHOLHDL, LDLDIRECT in the last 72 hours. Thyroid Function Tests: No results for input(s): TSH, T4TOTAL, FREET4, T3FREE, THYROIDAB in the last 72 hours. Anemia Panel: No results for input(s): VITAMINB12, FOLATE, FERRITIN, TIBC, IRON, RETICCTPCT in the last 72 hours. Urine analysis:    Component Value Date/Time   COLORURINE AMBER (A) 06/18/2019 2305   APPEARANCEUR CLOUDY (A) 06/18/2019 2305   LABSPEC 1.017 06/18/2019 2305   PHURINE 5.0 06/18/2019 2305   GLUCOSEU NEGATIVE 06/18/2019 2305   HGBUR MODERATE (A) 06/18/2019 2305   BILIRUBINUR MODERATE (A) 06/18/2019 [place] 06/18/2019 2305   PROTEINUR 100 (A) 06/18/2019 2305   NITRITE NEGATIVE 06/18/2019 2305   LEUKOCYTESUR  LARGE (A) 06/18/2019 2305   Sepsis Labs: @LABRCNTIP (procalcitonin:4,lacticacidven:4)  ) Recent Results (from the past 240 hour(s))  Urine culture     Status: Abnormal (Preliminary result)   Collection Time: 06/18/19 11:05 PM   Specimen: Urine, Random  Result Value Ref Range Status   Specimen Description URINE, RANDOM  Final   Special Requests NONE  Final   Culture (A)  Final    >=100,000 COLONIES/mL GRAM NEGATIVE RODS IDENTIFICATION AND SUSCEPTIBILITIES TO FOLLOW Performed at [facility], [address], [place], [place] [address]    Report Status PENDING  Incomplete  SARS Coronavirus 2 by RT PCR (hospital order, performed in [facility] hospital lab) Nasopharyngeal Nasopharyngeal Swab     Status: None   Collection Time: 06/18/19 11:25 PM  Specimen: Nasopharyngeal Swab  Result Value Ref Range Status   SARS Coronavirus 2 NEGATIVE NEGATIVE Final    Comment: (NOTE) If result is NEGATIVE SARS-CoV-2 target nucleic acids are NOT DETECTED. The SARS-CoV-2 RNA is generally detectable in upper and lower  respiratory specimens during the acute phase of infection. The lowest  concentration of SARS-CoV-2 viral copies this assay can detect is 250  copies / mL. A negative result does not preclude SARS-CoV-2 infection  and should not be used as the sole basis for treatment or other  patient management decisions.  A negative result may occur with  improper specimen collection / handling, submission of specimen other  than nasopharyngeal swab, presence of viral mutation(s) within the  areas targeted by this assay, and inadequate number of viral copies  (<250 copies / mL). A negative result must be combined with clinical  observations, patient history, and epidemiological information. If result is POSITIVE SARS-CoV-2 target nucleic acids are DETECTED. The SARS-CoV-2 RNA is generally detectable in upper and lower  respiratory specimens dur ing the acute phase of infection.  Positive    results are indicative of active infection with SARS-CoV-2.  Clinical  correlation with patient history and other diagnostic information is  necessary to determine patient infection status.  Positive results do  not rule out bacterial infection or co-infection with other viruses. If result is PRESUMPTIVE POSTIVE SARS-CoV-2 nucleic acids MAY BE PRESENT.   A presumptive positive result was obtained on the submitted specimen  and confirmed on repeat testing.  While 2019 novel coronavirus  (SARS-CoV-2) nucleic acids may be present in the submitted sample  additional confirmatory testing may be necessary for epidemiological  and / or clinical management purposes  to differentiate between  SARS-CoV-2 and other Sarbecovirus currently known to infect humans.  If clinically indicated additional testing with an alternate test  methodology [phone]) is advised. The SARS-CoV-2 RNA is generally  detectable in upper and lower respiratory sp ecimens during the acute  phase of infection. The expected result is Negative. Fact Sheet for Patients:  StrictlyIdeas.no Fact Sheet for Healthcare Providers: BankingDealers.co.za This test is not yet approved or cleared by the [place] FDA and has been authorized for detection and/or diagnosis of SARS-CoV-2 by FDA under an Emergency Use Authorization (EUA).  This EUA will remain in effect (meaning this test can be used) for the duration of the COVID-19 declaration under Section 564(b)(1) of the Act, 21 U.S.C. section 360bbb-3(b)(1), unless the authorization is terminated or revoked sooner. Performed at [facility], [place] [address], [place], [place] [address]   Culture, blood (routine x 2)     Status: None (Preliminary result)   Collection Time: 06/19/19  1:17 AM   Specimen: BLOOD LEFT HAND  Result Value Ref Range Status   Specimen Description BLOOD LEFT HAND  Final   Special Requests   Final     BOTTLES DRAWN AEROBIC AND ANAEROBIC Blood Culture results may not be optimal due to an inadequate volume of blood received in culture bottles   Culture   Final    NO GROWTH 1 DAY Performed at [facility], [place] [address] [place], [place] [address]    Report Status PENDING  Incomplete  Culture, blood (routine x 2)     Status: None (Preliminary result)   Collection Time: 06/19/19  1:30 AM   Specimen: BLOOD RIGHT HAND  Result Value Ref Range Status   Specimen Description BLOOD RIGHT HAND  Final   Special  Requests   Final    BOTTLES DRAWN AEROBIC AND ANAEROBIC Blood Culture results may not be optimal due to an inadequate volume of blood received in culture bottles   Culture   Final    NO GROWTH 1 DAY Performed at [facility] [address], [place], [place] [address]    Report Status PENDING  Incomplete         Radiology Studies: Ct Abdomen Pelvis Wo Contrast  Result Date: 06/19/2019 CLINICAL DATA:  Nausea and vomiting with right upper quadrant pain EXAM: CT ABDOMEN AND PELVIS WITHOUT CONTRAST TECHNIQUE: Multidetector CT imaging of the abdomen and pelvis was performed following the standard protocol without IV contrast. COMPARISON:  11/23/2017 FINDINGS: Lower chest: Small right more than left pleural effusion. Chronic cardiomegaly. Hepatobiliary: Small hepatic low densities also seen on prior.New bile duct dilatation that is both intra and extrahepatic with a 13 mm stone at the level of the distal common bile duct. There are likely additional more subtly calcified calculi in the distal CBD. Subtly cholelithiasis within the collapsed gallbladder evaluated by ultrasound earlier today. Pancreas: No convincing pancreatitis. Spleen: Unremarkable. Adrenals/Urinary Tract: Negative adrenals. 4 mm high-density right renal lesion, nonspecific without contrast. Bladder is decompressed by Foley catheter. Stomach/Bowel: No obstruction. No evidence of bowel inflammation. Predominantly distal  colonic diverticulosis. Vascular/Lymphatic: No acute vascular abnormality. Extensive aortic and iliac atherosclerosis. No mass or adenopathy. Reproductive:Moderate ascites with pockets of high-density in the lower right flank and pelvis. Other: Anasarca and generalized retroperitoneal edema. Musculoskeletal: Expansion with cortical sclerosis and thickening of the left ilium, pagetoid and stable. Lower lumbar facet arthropathy and disc narrowing with L4-5 spinal stenosis. These results were called by telephone at the time of interpretation on 06/19/2019 at 4:46 am to provider Zaleski, who verbally acknowledged these results. IMPRESSION: 1. Biliary dilatation due to choledocholithiasis. A 13 mm stone is present at the distal common bile duct with probable additional less calcified calculi. 2. Unexpectedly collapsed gallbladder given the degree of biliary dilatation. There is history of cholecystostomy and there is moderate complex ascites (possible hemoperitoneum) question leakage through the cholecystotomy. 3. Cardiomegaly with anasarca and small pleural effusions. Electronically Signed   By: Monte Fantasia M.D.   On: 06/19/2019 04:48   Dg Chest 2 View  Result Date: 06/18/2019 CLINICAL DATA:  Shortness of breath EXAM: CHEST - 2 VIEW COMPARISON:  02/04/2019 FINDINGS: Small right-sided pleural effusion. Cardiomegaly with vascular congestion. Hazy atelectasis at the bases. No pneumothorax. IMPRESSION: 1. Cardiomegaly with vascular congestion 2. Small right-sided pleural effusion and hazy atelectasis at the right base. Electronically Signed   By: Donavan Foil M.D.   On: 06/18/2019 21:37   US Abdomen Limited  Addendum Date: 06/19/2019   ADDENDUM REPORT: 06/19/2019 00:59 ADDENDUM: New images were obtained. The gallbladder appears to be decompressed with shadowing echogenic stones the largest measuring 1.4 cm. The gallbladder wall measures 2.7 mm. No pericholecystic fluid. In the body of the report the lesion  mentioned should be hyperechoic, as on prior exam and likely hemangioma. IMPRESSION: Decompressed gallbladder with cholelithiasis, the largest stone measuring 1.4 mm. Electronically Signed   By: Prudencio Pair M.D.   On: 06/19/2019 00:59   Result Date: 06/19/2019 CLINICAL DATA:  Cholangitis EXAM: ULTRASOUND ABDOMEN LIMITED RIGHT UPPER QUADRANT COMPARISON:  None. FINDINGS: Gallbladder: The patient is status post cholecystectomy. Common bile duct: Diameter: 7 mm Liver: Within the right hepatic lobe there is a hypoechoic lesion measuring 1.1 x 1.0 x 1.4 cm. There is mild intrahepatic  biliary ductal dilatation. There is a slightly heterogeneous appearance to the liver parenchyma. There is question of a slightly nodular contour of the liver parenchyma. Trace perihepatic ascites seen. Portal vein is patent on color Doppler imaging with normal direction of blood flow towards the liver. Other: None. IMPRESSION: Trace perihepatic ascites. Mild intrahepatic biliary ductal dilatation. Status post cholecystectomy. Slightly heterogeneous liver parenchyma with slightly nodular contour. This is non-specific could be due to mild cirrhosis. Electronically Signed: By: Prudencio Pair M.D. On: 06/19/2019 00:13   Dg Ercp Biliary & Pancreatic Ducts  Result Date: 06/19/2019 CLINICAL DATA:  ERCP.  Choledocholithiasis. EXAM: ERCP TECHNIQUE: Multiple spot images obtained with the fluoroscopic device and submitted for interpretation post-procedure. COMPARISON:  CT abdomen pelvis-06/19/2019; image guided cholecystostomy tube placement-11/25/2017; cholangiogram via existing cholecystostomy tube-11/30/2017 FLUOROSCOPY TIME:  2 minutes, 34 seconds FINDINGS: Two spot intraoperative fluoroscopic images during ERCP are provided for review Initial image demonstrates an ERCP probe overlying the right upper abdominal quadrant. There is selective cannulation and opacification of common bile duct which appears moderately dilated. There are several  ill-defined filling defects seen within the CBD worrisome for choledocholithiasis. Completion image demonstrates apparent interval stone extraction with persistent dilatation of the CBD. There is faint opacification of the cystic duct and gallbladder lumen. No definitive opacification of the pancreatic duct. IMPRESSION: ERCP with findings of choledocholithiasis. These images were submitted for radiologic interpretation only. Please see the procedural report for the amount of contrast and the fluoroscopy time utilized. Electronically Signed   By: Sandi Mariscal M.D.   On: 06/19/2019 13:31   Dg Esophagus W Single Cm (sol Or Thin Ba)  Result Date: 06/20/2019 CLINICAL DATA:  Post ERCP, history of globus sensation. EXAM: ESOPHOGRAM/BARIUM SWALLOW TECHNIQUE: Single contrast examination was performed using water-soluble contrast. FLUOROSCOPY TIME:  Fluoroscopy Time:  1 minutes 18 seconds Radiation Exposure Index (if provided by the fluoroscopic device): 7 mGy Number of Acquired Spot Images: 0 COMPARISON:  None. FINDINGS: Only a small amount of water-soluble contrast could be swallowed due to patient comfort. Limited assessment shows no signs of esophageal obstruction or leak. Luminal characteristics are difficult to evaluate given small volume. There is likely also a small hiatal hernia. Imaging of the upper abdomen shows marked distention of some akin small-bowel loops. Due to patient nausea and the marked distension of bowel in the upper abdomen the study was terminated at this time. IMPRESSION: Limited esophageal assessment without signs of gross leak or narrowing. Only a small amount of water-soluble contrast could be swallowed. Ileus versus bowel obstruction incidentally noted in the upper abdomen. This and patient comfort precluded further evaluation. Following resolution of acute symptoms repeat esophageal assessment could be performed. Electronically Signed   By: Zetta Bills M.D.   On: 06/20/2019 12:56         Scheduled Meds:  aspirin EC  81 mg Oral Daily   carvedilol  6.25 mg Oral BID WC   Chlorhexidine Gluconate Cloth  6 each Topical Daily   feeding supplement (ENSURE ENLIVE)  237 mL Oral BID BM   finasteride  5 mg Oral Daily   insulin aspart  0-9 Units Subcutaneous Q4H   multivitamin with minerals  1 tablet Oral Daily   sodium chloride flush  3 mL Intravenous Once   tamsulosin  0.4 mg Oral Daily   Continuous Infusions:  sodium chloride 100 mL/hr at 06/20/19 1350   piperacillin-tazobactam (ZOSYN)  IV 2.25 g (06/20/19 1448)     LOS: 1 day  Time spent: 35 minutes    Edwin Dada, MD [facility] 06/20/2019, 6:50 PM     Please page through AMION:  [email or web address] Password [email or web address] If 7PM-7AM, please contact night-coverage

## 2019-06-20 NOTE — Progress Notes (Signed)
[facility] Progress Note    Since last GI note: ERCP yesterday, see full report in Epic. Sphincterotomy and bile duct stone removal, noted purulent cholangitis and partial filling of the cystic duct.  HE feels overall much better than yesterday.  No signficant abdominal pains. No n/vomiting.  Tolerated liquid diet yesterday and is eager to advance to solids.  Objective: Vital signs in last 24 hours: Temp:  [97 F (36.1 C)-97.9 F (36.6 C)] 97.9 F (36.6 C) (10/21 0334) Pulse Rate:  [88-110] 97 (10/21 0334) Resp:  [11-30] 20 (10/21 0334) BP: (92-125)/(62-88) 120/78 (10/21 0334) SpO2:  [94 %-100 %] 99 % (10/21 0334) Weight:  [90 kg] 90 kg (10/20 1128) Last BM Date: 06/14/19 General: alert and oriented times 3 Heart: regular rate and rythm Abdomen: soft, mildly tender epigatrium, non-distended, normal bowel sounds   Lab Results: Recent Labs    06/18/19 2054 06/19/19 0819 06/20/19 0616  WBC 9.9 9.2 10.5  HGB 12.1* 11.7* 11.8*  PLT 226 211 184  MCV 88.3 87.5 84.5   Recent Labs    06/19/19 0819 06/19/19 1413 06/20/19 0616  NA 136 137 137  K 4.0 4.1 4.2  CL 100 102 100  CO2 21* 21* 22  GLUCOSE 120* 118* 146*  BUN 73* 77* 90*  CREATININE 2.88* 3.26* 3.76*  CALCIUM 8.5* 8.4* 8.1*   Recent Labs    06/18/19 2054 06/19/19 0819 06/19/19 1413 06/20/19 0616  PROT 7.7 6.9  --  6.2*  ALBUMIN 2.5* 2.2* 2.2* 1.8*  AST 31 25  --  19  ALT 37 30  --  22  ALKPHOS 89 70  --  62  BILITOT 13.4* 14.8*  --  12.8*   Recent Labs    06/19/19 0036  INR 1.5*    Medications: Scheduled Meds: . aspirin EC  81 mg Oral Daily  . carvedilol  6.25 mg Oral BID WC  . Chlorhexidine Gluconate Cloth  6 each Topical Daily  . feeding supplement (ENSURE ENLIVE)  237 mL Oral BID BM  . finasteride  5 mg Oral Daily  . furosemide  40 mg Oral Daily  . indomethacin  100 mg Rectal Once  . insulin aspart  0-9 Units Subcutaneous Q4H  . multivitamin with minerals  1 tablet Oral Daily   . sodium chloride flush  3 mL Intravenous Once  . tamsulosin  0.4 mg Oral Daily   Continuous Infusions: . piperacillin-tazobactam (ZOSYN)  IV 3.375 g (06/20/19 0522)   PRN Meds:.HYDROmorphone (DILAUDID) injection, ondansetron (ZOFRAN) IV    Assessment/Plan: 78 y.o. male with complex gallstone disease  ERCP yesterday with biliary sphincterotomy and stone removal, also noted purulent cholangitis which was a bit of surprise given normal CBC, afebrile.  I do have some concern about only partial filling of his cystic duct and non-visualization of his gallbladder.  He had known cystic duct stones in 2019 while recovering from acute cholecystitis that was treated with GB drain and antibiotics alone (very poor surgical candidate).  If he fails to clinically recover from this acute illness back to normal, will have to consider recurrent acute cholecystitis.  For now, should continue IV abx another 36 to 48 hours given his purulent cholangitis.  He has heart healthy diet in place already. He knows he needs to ambulate.   Milus Banister, MD  06/20/2019, 7:45 AM [facility] Pager [phone]

## 2019-06-20 NOTE — Progress Notes (Signed)
Initial Nutrition Assessment  DOCUMENTATION CODES:   Not applicable  INTERVENTION:   -Ensure Enlive po TID, each supplement provides 350 kcal and 20 grams of protein -MVI with minerals daily -Magic cup TID with meals, each supplement provides 290 kcal and 9 grams of protein  NUTRITION DIAGNOSIS:   Inadequate oral intake related to altered GI function as evidenced by meal completion < 25%.  GOAL:   Patient will meet greater than or equal to 90% of their needs  MONITOR:   Supplement acceptance, Diet advancement, PO intake, Labs, Weight trends, Skin, I & O's  REASON FOR ASSESSMENT:   Malnutrition Screening Tool    ASSESSMENT:   [name]  is a 78 y.o. 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 Constant,  Pt is not sure if worse with food. Pt had n/v x1, but no blood.  Pt denies fever, chills, cough, cp, palp, sob, diarrhea, constipation, brbpr, black stool, dysuria, hematuria.  Pt has chronic catheter due to ? Bph.  Pt presented due to worsening abdominal pain. Pt denies etoh use for the past 4 months.  Pt admitted with abdominal pain, rule out cholecystitis.   10/20- s/p ERCP- revealed choledocholithiasis and purulent cholangitis founds and s/p biliary sphincterotomy and balloon sweeping 10/21- s/p BSE- recommend regular diet with thin liquids  Reviewed I/O's: +149 ml x 24 hours  UOP: 120 ml x 24 hours  Per GI notes, plan continued IV antibiotics.   Spoke with pt, who was sitting in recliner chair at time of visit. He reports feeling better today. Observed lunch and meal trays, of which pt consumed only about 10% (documented meal completion 30%). Pt reports that his appetite is poor secondary to the feeling of food getting stuck and "coming back up". He is taking  miralax, which is helping with this. He took a few sips of Ensure earlier.   Pt reports that he typically has a good appetite; he always eats a a large breakfast and will usually consume a sandwich later on in the day. Pt reports that his appetite slowly decreased approximately 5 days ago, due to abdominal pain ("I went straight to the hospital, because they have all the equipment there').   Per pt, he estimates he has lost approximately 30# within the past year, which he attributes to diuretic use from heart failure diagnosis (pt takes lasix at home). Noted pt has experienced a 2.3% wt loss over the past 10 months, which is not significant for time frame.   Discussed importance of good meal and supplement intake to promote healing. Pt amenable to try Ensure supplements after RD educated pt on importance.    Lab Results  Component Value Date   HGBA1C 5.6 02/26/2015   PTA DM medications are none.   Labs reviewed: CBGS: 117-134 (inpatient orders for glycemic control are 0-9 units insulin aspart TID with meals).   NUTRITION - FOCUSED PHYSICAL EXAM:    Most Recent Value  Orbital Region  No depletion  Upper Arm Region  Mild depletion  Thoracic and Lumbar Region  No depletion  Buccal Region  No depletion  Temple Region  Mild depletion  Clavicle Bone Region  Mild depletion  Clavicle and Acromion Bone Region  No depletion  Scapular Bone Region  No depletion  Dorsal Hand  No  depletion  Patellar Region  No depletion  Anterior Thigh Region  No depletion  Posterior Calf Region  No depletion  Edema (RD Assessment)  Mild  Hair  Reviewed  Eyes  Reviewed  Mouth  Reviewed  Skin  Reviewed  Nails  Reviewed       Diet Order:   Diet Order            Diet Heart Room service appropriate? Yes; Fluid consistency: Thin  Diet effective now              EDUCATION NEEDS:   Education needs have been addressed  Skin:  Skin Assessment: Reviewed RN Assessment  Last BM:  06/14/19  Height:    Ht Readings from Last 1 Encounters:  06/19/19 5\' 11"  (1.803 m)    Weight:   Wt Readings from Last 1 Encounters:  06/19/19 90 kg    Ideal Body Weight:  78.2 kg  BMI:  Body mass index is 27.67 kg/m.  Estimated Nutritional Needs:   Kcal:  2050-2250  Protein:  95-110 grams  Fluid:  2 L    Jenifer A. Jimmye Norman, RD, LDN, [place] Registered Dietitian II Certified Diabetes Care and Education Specialist Pager: [phone] After hours Pager: [phone]

## 2019-06-20 NOTE — Evaluation (Addendum)
Clinical/Bedside Swallow Evaluation Patient Details  Name: [name] MRN: [id number] Date of Birth: [birth year]  Today's Date: 06/20/2019 Time: SLP Start Time (ACUTE ONLY): 36 SLP Stop Time (ACUTE ONLY): 1115 SLP Time Calculation (min) (ACUTE ONLY): 25 min  Past Medical History:  Past Medical History:  Diagnosis Date  . Acute cholecystitis 11/23/2017  . Acute respiratory failure with hypoxia ([place])   . Acute upper back pain   . AKI (acute kidney injury) ([place])   . Alcohol abuse 02/26/2015  . Anemia, deficiency   . Back pain    LUMBOSACRAL  . Bronchitis, complicated   . Cardiomegaly   . Chronic combined systolic and diastolic CHF (congestive heart failure) ([place])   . Chronic pain    HIGH RISK MED USE COMPREHENSIVE HIGH RISK  . CKD (chronic kidney disease), stage III   . Demand ischemia ([place])    a. minimally elevated troponin peak 0.11 in 2016, felt demand ischemia. Cath offered but pt wished to have done back in [place].  . Diabetes mellitus without complication ([place])   . Edema, leg   . Elevated troponin 02/26/2015  . Essential hypertension   . Fatigue   . Heart abnormality   . Hyperlipidemia   . Hypertension   . Neck pain, acute   . PVD (peripheral vascular disease) ([place])   . Renal lesion 11/23/2017  . SOB (shortness of breath) on exertion   . Solitary kidney   . Vitamin D deficiency    Past Surgical History:  Past Surgical History:  Procedure Laterality Date  . IR CHOLANGIOGRAM EXISTING TUBE  12/27/2017  . IR PERC CHOLECYSTOSTOMY  11/25/2017  . IR RADIOLOGIST EVAL & MGMT  02/02/2018   HPI:  78yo male admitted 06/18/2019 with RUQ abdominal pain. Pt underwent ERCP 10/20 for sphincterotomy and removal of stones.  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 Pt presents with adequate oral motor strength and function. He reports globus sensation (food  "sticking"), followed by regurgitation. This raises suspicion for esopheageal issues. Pt tolerated trials of thin liquid and solid (saltine cracker) without overt s/s aspiration. Pt reports cracker is "stuck". Apparently the difficulty he had swallowing pills yesterday was related to being given multiple pills at one time, rather than one at a time which he prefers. If this is the case, pt may very likely have had esophageal motility issues with multiple medications at once.   Recommend consideration of a regular barium swallow (DG Esophagus) to evaluate esophageal motility. Pt was given strategies for management of esophageal dysmotility to assist in the interim. SLP will follow up after esophagram completed to continue education.   SLP Visit Diagnosis: Dysphagia, unspecified (R13.10)    Aspiration Risk   Moderate    Diet Recommendation Regular;Thin liquid   Liquid Administration via: Cup;Straw Medication Administration: Whole meds with liquid Supervision: Patient able to self feed;Intermittent supervision to cue for compensatory strategies Compensations: Slow rate;Small sips/bites;Follow solids with liquid Postural Changes: Seated upright at 90 degrees;Remain upright for at least 30 minutes after po intake    Other  Recommendations Oral Care Recommendations: Oral care BID   Follow up Recommendations None      Frequency and Duration min 1 x/week  1 week       Prognosis Prognosis for Safe Diet Advancement: Good      Swallow Study   General Date of Onset: 06/20/19 HPI: 78yo male admitted 06/18/2019  with RUQ abdominal pain. Pt underwent ERCP 10/20 for sphincterotomy and removal of stones.  PMH: HTM HLD, DM2, CKD3, PVD, chronic CHF, etoh, cholecystectomy (10/2018) Type of Study: Bedside Swallow Evaluation Previous Swallow Assessment: none Diet Prior to this Study: Regular;Thin liquids Temperature Spikes Noted: No Respiratory Status: Other (comment) History of Recent Intubation:  Yes Length of Intubations (days): 1 days(during surgery only) Date extubated: 06/19/19 Behavior/Cognition: Alert;Cooperative Oral Cavity Assessment: Within Functional Limits Oral Care Completed by SLP: No Oral Cavity - Dentition: Adequate natural dentition Vision: Functional for self-feeding Self-Feeding Abilities: Able to feed self Patient Positioning: Upright in bed Baseline Vocal Quality: Normal Volitional Cough: Strong Volitional Swallow: Able to elicit    Oral/Motor/Sensory Function Overall Oral Motor/Sensory Function: Within functional limits   Ice Chips Ice chips: Not tested   Thin Liquid Thin Liquid: Within functional limits Presentation: Straw    Nectar Thick Nectar Thick Liquid: Not tested   Honey Thick Honey Thick Liquid: Not tested   Puree Puree: Not tested   Solid     Solid: Within functional limits Presentation: Self Fed     Celia B. Quentin Ore, [facility], [place] Speech Language Pathologist Office: [phone] Pager: [phone]  Shonna Chock 06/20/2019,11:24 AM

## 2019-06-20 NOTE — Plan of Care (Signed)
  Problem: Pain Managment: Goal: General experience of comfort will improve Outcome: Progressing   Problem: Safety: Goal: Ability to remain free from injury will improve Outcome: Progressing   Problem: Skin Integrity: Goal: Risk for impaired skin integrity will decrease Outcome: Progressing   

## 2019-06-20 NOTE — Anesthesia Postprocedure Evaluation (Signed)
Anesthesia Post Note  Patient: [name]  Procedure(s) Performed: ENDOSCOPIC RETROGRADE CHOLANGIOPANCREATOGRAPHY (ERCP) WITH PROPOFOL (N/A ) SPHINCTEROTOMY REMOVAL OF STONES     Patient location during evaluation: Endoscopy Anesthesia Type: General Level of consciousness: sedated Pain management: pain level controlled Vital Signs Assessment: post-procedure vital signs reviewed and stable Respiratory status: spontaneous breathing Cardiovascular status: stable Postop Assessment: no apparent nausea or vomiting Anesthetic complications: no    Last Vitals:  Vitals:   06/20/19 0334 06/20/19 0921  BP: 120/78 126/73  Pulse: 97 95  Resp: 20   Temp: 36.6 C   SpO2: 99%     Last Pain:  Vitals:   06/20/19 0921  TempSrc:   PainSc: 0-No pain   Pain Goal:                   Huston Foley

## 2019-06-20 NOTE — Progress Notes (Signed)
Progress Note  Patient Name: [name] Date of Encounter: 06/20/2019  Primary Cardiologist: Candee Furbish, MD   Subjective   Feeling better.  Abdominal pain improving.  Tolerating oral diet.  Inpatient Medications    Scheduled Meds:  aspirin EC  81 mg Oral Daily   carvedilol  6.25 mg Oral BID WC   Chlorhexidine Gluconate Cloth  6 each Topical Daily   feeding supplement (ENSURE ENLIVE)  237 mL Oral BID BM   finasteride  5 mg Oral Daily   indomethacin  100 mg Rectal Once   insulin aspart  0-9 Units Subcutaneous Q4H   multivitamin with minerals  1 tablet Oral Daily   sodium chloride flush  3 mL Intravenous Once   tamsulosin  0.4 mg Oral Daily   Continuous Infusions:  piperacillin-tazobactam (ZOSYN)  IV 3.375 g (06/20/19 0522)   PRN Meds: HYDROmorphone (DILAUDID) injection, ondansetron (ZOFRAN) IV   Vital Signs    Vitals:   06/19/19 2211 06/19/19 2330 06/20/19 0334 06/20/19 0921  BP: 109/68 112/72 120/78 126/73  Pulse: 92 94 97 95  Resp: 14 18 20    Temp:  97.7 F (36.5 C) 97.9 F (36.6 C)   TempSrc:  Oral Oral   SpO2: 96% 100% 99%   Weight:      Height:        Intake/Output Summary (Last 24 hours) at 06/20/2019 1040 Last data filed at 06/20/2019 1000 Gross per 24 hour  Intake 390 ml  Output 121 ml  Net 269 ml   Last 3 Weights 06/19/2019 06/19/2019 02/12/2019  Weight (lbs) 198 lb 6.6 oz 198 lb 6.6 oz 190 lb  Weight (kg) 90 kg 90 kg 86.183 kg  Some encounter information is confidential and restricted. Go to Review Flowsheets activity to see all data.      Telemetry    Atrial fibrillation.  Rate <100 bpm.  NSVT up to 6 seconds.  - Personally Reviewed  ECG    N/a - Personally Reviewed  Physical Exam   VS:  BP 126/73    Pulse 95    Temp 97.9 F (36.6 C) (Oral)    Resp 20    Ht 5\' 11"  (1.803 m)    Wt 90 kg    SpO2 99%    BMI 27.67 kg/m  , BMI Body mass index is 27.67 kg/m. GENERAL:  Chronically ill-appearing HEENT: Pupils equal round  and reactive, fundi not visualized, oral mucosa unremarkable NECK:  No jugular venous distention, waveform within normal limits, carotid upstroke brisk and symmetric, no bruits, no thyromegaly LYMPHATICS:  No cervical adenopathy LUNGS:  Clear to auscultation bilaterally HEART:  Irregularly irregular  PMI not displaced or sustained,S1 and S2 within normal limits, no S3, no S4, no clicks, no rubs, no murmurs ABD:  RUQ TTP.  No rebound/guarding.   EXT:  2 plus pulses throughout, 1+ woody edema, no cyanosis no clubbing SKIN:  No rashes no nodules NEURO:  Cranial nerves II through XII grossly intact, motor grossly intact throughout PSYCH:  Cognitively intact, oriented to person place and time   Labs    High Sensitivity Troponin:  No results for input(s): TROPONINIHS in the last 720 hours.    Chemistry Recent Labs  Lab 06/18/19 2054 06/19/19 0819 06/19/19 1413 06/20/19 0616  NA 133* 136 137 137  K 4.1 4.0 4.1 4.2  CL 98 100 102 100  CO2 20* 21* 21* 22  GLUCOSE 127* 120* 118* 146*  BUN 69* 73* 77* 90*  CREATININE 3.09* 2.88* 3.26* 3.76*  CALCIUM 8.5* 8.5* 8.4* 8.1*  PROT 7.7 6.9  --  6.2*  ALBUMIN 2.5* 2.2* 2.2* 1.8*  AST 31 25  --  19  ALT 37 30  --  22  ALKPHOS 89 70  --  62  BILITOT 13.4* 14.8*  --  12.8*  GFRNONAA 19* 20* 18* 15*  GFRAA 22* 24* 20* 17*  ANIONGAP 15 15 14 15      Hematology Recent Labs  Lab 06/18/19 2054 06/19/19 0819 06/20/19 0616  WBC 9.9 9.2 10.5  RBC 4.11* 4.07* 3.99*  HGB 12.1* 11.7* 11.8*  HCT 36.3* 35.6* 33.7*  MCV 88.3 87.5 84.5  MCH 29.4 28.7 29.6  MCHC 33.3 32.9 35.0  RDW 17.1* 17.0* 16.5*  PLT 226 211 184    BNP Recent Labs  Lab 06/18/19 2109  BNP 2,536.0*     DDimer No results for input(s): DDIMER in the last 168 hours.   Radiology    Ct Abdomen Pelvis Wo Contrast  Result Date: 06/19/2019 CLINICAL DATA:  Nausea and vomiting with right upper quadrant pain EXAM: CT ABDOMEN AND PELVIS WITHOUT CONTRAST TECHNIQUE:  Multidetector CT imaging of the abdomen and pelvis was performed following the standard protocol without IV contrast. COMPARISON:  11/23/2017 FINDINGS: Lower chest: Small right more than left pleural effusion. Chronic cardiomegaly. Hepatobiliary: Small hepatic low densities also seen on prior.New bile duct dilatation that is both intra and extrahepatic with a 13 mm stone at the level of the distal common bile duct. There are likely additional more subtly calcified calculi in the distal CBD. Subtly cholelithiasis within the collapsed gallbladder evaluated by ultrasound earlier today. Pancreas: No convincing pancreatitis. Spleen: Unremarkable. Adrenals/Urinary Tract: Negative adrenals. 4 mm high-density right renal lesion, nonspecific without contrast. Bladder is decompressed by Foley catheter. Stomach/Bowel: No obstruction. No evidence of bowel inflammation. Predominantly distal colonic diverticulosis. Vascular/Lymphatic: No acute vascular abnormality. Extensive aortic and iliac atherosclerosis. No mass or adenopathy. Reproductive:Moderate ascites with pockets of high-density in the lower right flank and pelvis. Other: Anasarca and generalized retroperitoneal edema. Musculoskeletal: Expansion with cortical sclerosis and thickening of the left ilium, pagetoid and stable. Lower lumbar facet arthropathy and disc narrowing with L4-5 spinal stenosis. These results were called by telephone at the time of interpretation on 06/19/2019 at 4:46 am to provider [place], who verbally acknowledged these results. IMPRESSION: 1. Biliary dilatation due to choledocholithiasis. A 13 mm stone is present at the distal common bile duct with probable additional less calcified calculi. 2. Unexpectedly collapsed gallbladder given the degree of biliary dilatation. There is history of cholecystostomy and there is moderate complex ascites (possible hemoperitoneum) question leakage through the cholecystotomy. 3. Cardiomegaly with anasarca and  small pleural effusions. Electronically Signed   By: Monte Fantasia M.D.   On: 06/19/2019 04:48   Dg Chest 2 View  Result Date: 06/18/2019 CLINICAL DATA:  Shortness of breath EXAM: CHEST - 2 VIEW COMPARISON:  02/04/2019 FINDINGS: Small right-sided pleural effusion. Cardiomegaly with vascular congestion. Hazy atelectasis at the bases. No pneumothorax. IMPRESSION: 1. Cardiomegaly with vascular congestion 2. Small right-sided pleural effusion and hazy atelectasis at the right base. Electronically Signed   By: Donavan Foil M.D.   On: 06/18/2019 21:37   US Abdomen Limited  Addendum Date: 06/19/2019   ADDENDUM REPORT: 06/19/2019 00:59 ADDENDUM: New images were obtained. The gallbladder appears to be decompressed with shadowing echogenic stones the largest measuring 1.4 cm. The gallbladder wall measures 2.7 mm. No pericholecystic fluid. In the body of  the report the lesion mentioned should be hyperechoic, as on prior exam and likely hemangioma. IMPRESSION: Decompressed gallbladder with cholelithiasis, the largest stone measuring 1.4 mm. Electronically Signed   By: Prudencio Pair M.D.   On: 06/19/2019 00:59   Result Date: 06/19/2019 CLINICAL DATA:  Cholangitis EXAM: ULTRASOUND ABDOMEN LIMITED RIGHT UPPER QUADRANT COMPARISON:  None. FINDINGS: Gallbladder: The patient is status post cholecystectomy. Common bile duct: Diameter: 7 mm Liver: Within the right hepatic lobe there is a hypoechoic lesion measuring 1.1 x 1.0 x 1.4 cm. There is mild intrahepatic biliary ductal dilatation. There is a slightly heterogeneous appearance to the liver parenchyma. There is question of a slightly nodular contour of the liver parenchyma. Trace perihepatic ascites seen. Portal vein is patent on color Doppler imaging with normal direction of blood flow towards the liver. Other: None. IMPRESSION: Trace perihepatic ascites. Mild intrahepatic biliary ductal dilatation. Status post cholecystectomy. Slightly heterogeneous liver parenchyma  with slightly nodular contour. This is non-specific could be due to mild cirrhosis. Electronically Signed: By: Prudencio Pair M.D. On: 06/19/2019 00:13   Dg Ercp Biliary & Pancreatic Ducts  Result Date: 06/19/2019 CLINICAL DATA:  ERCP.  Choledocholithiasis. EXAM: ERCP TECHNIQUE: Multiple spot images obtained with the fluoroscopic device and submitted for interpretation post-procedure. COMPARISON:  CT abdomen pelvis-06/19/2019; image guided cholecystostomy tube placement-11/25/2017; cholangiogram via existing cholecystostomy tube-11/30/2017 FLUOROSCOPY TIME:  2 minutes, 34 seconds FINDINGS: Two spot intraoperative fluoroscopic images during ERCP are provided for review Initial image demonstrates an ERCP probe overlying the right upper abdominal quadrant. There is selective cannulation and opacification of common bile duct which appears moderately dilated. There are several ill-defined filling defects seen within the CBD worrisome for choledocholithiasis. Completion image demonstrates apparent interval stone extraction with persistent dilatation of the CBD. There is faint opacification of the cystic duct and gallbladder lumen. No definitive opacification of the pancreatic duct. IMPRESSION: ERCP with findings of choledocholithiasis. These images were submitted for radiologic interpretation only. Please see the procedural report for the amount of contrast and the fluoroscopy time utilized. Electronically Signed   By: Sandi Mariscal M.D.   On: 06/19/2019 13:31    Cardiac Studies   TTE: 10/2017  Study Conclusions  - Left ventricle: The cavity size was normal. Wall thickness was increased in a pattern of moderate LVH. Systolic function was severely reduced. The estimated ejection fraction was in the range of 20% to 25%. Diffuse hypokinesis. The study is not technically sufficient to allow evaluation of LV diastolic function. - Mitral valve: Mildly thickened leaflets . There was  trivial regurgitation. - Left atrium: The atrium was normal in size. - Right ventricle: The cavity size was mildly dilated. - Tricuspid valve: There was trivial regurgitation. - Pulmonary arteries: PA peak pressure: 26 mm Hg (S). - Inferior vena cava: The vessel was dilated. The respirophasic diameter changes were blunted (<50%), consistent with elevated central venous pressure.  Impressions:  - Compared to a prior study in 2016, the LVEF is slightly lower at 20-25%.  Patient Profile     Mr. Canipe is a 69M with chronic systolic and diastolic heart failure, diabetes, CKD III, EtOH abuse, hypertension and hyperlipidemia admitted with recurrent choledocholithiasis and cholangitis.    Assessment & Plan    # Chronic systolic and diastolic heart failure: # Essential hypertension: LVEF chronically low.  20-25% in 2019.  Currently he is compensated and actually volume depleted.  He reports a prior stress test at an outside hospital.  BP has been labile.  Continue carvedilol.  Will add ARB as BP and renal function permit.  No diuretics for now.  He received one dose of lasix this AM.  # NSVT:  Asymptomatic.  Continue carvedilol.  Keep K>4, Mg >2.   # Acute on chronic renal failure: Worsened in the setting of poor oral intake and diuretics.  Will hold lasix.  Encourage oral intake as able and consider gentle fluid bolus if no improvement by tomorrow.       For questions or updates, please contact [place] Please consult [email or web address] for contact info under        Signed, Skeet Latch, MD  06/20/2019, 10:40 AM

## 2019-06-20 NOTE — Plan of Care (Signed)
  Problem: Clinical Measurements: Goal: Will remain free from infection Outcome: Progressing Note: Pt has shown no new signs of infection during my care.    Problem: Nutrition: Goal: Adequate nutrition will be maintained Outcome: Not Progressing Note: Pt complaining of food and pills "getting stuck" in his throat during my care. Speech eval completed and esophagus exam done. MD aware. See new orders and will monitor.

## 2019-06-21 ENCOUNTER — Other Ambulatory Visit (HOSPITAL_COMMUNITY): Payer: Medicare Other

## 2019-06-21 ENCOUNTER — Inpatient Hospital Stay (HOSPITAL_COMMUNITY): Payer: Medicare Other

## 2019-06-21 DIAGNOSIS — N179 Acute kidney failure, unspecified: Secondary | ICD-10-CM | POA: Diagnosis not present

## 2019-06-21 DIAGNOSIS — K8031 Calculus of bile duct with cholangitis, unspecified, with obstruction: Secondary | ICD-10-CM | POA: Diagnosis not present

## 2019-06-21 DIAGNOSIS — I5043 Acute on chronic combined systolic (congestive) and diastolic (congestive) heart failure: Secondary | ICD-10-CM | POA: Diagnosis not present

## 2019-06-21 LAB — URINE CULTURE: Culture: >=100000 — AB

## 2019-06-21 LAB — GLUCOSE, CAPILLARY
Glucose-Capillary: 100 mg/dL — ABNORMAL HIGH (ref 70–99)
Glucose-Capillary: 101 mg/dL — ABNORMAL HIGH (ref 70–99)
Glucose-Capillary: 122 mg/dL — ABNORMAL HIGH (ref 70–99)
Glucose-Capillary: 131 mg/dL — ABNORMAL HIGH (ref 70–99)
Glucose-Capillary: 136 mg/dL — ABNORMAL HIGH (ref 70–99)
Glucose-Capillary: 148 mg/dL — ABNORMAL HIGH (ref 70–99)

## 2019-06-21 LAB — COMPREHENSIVE METABOLIC PANEL
ALT: 19 U/L (ref 0–44)
AST: 20 U/L (ref 15–41)
Albumin: 1.8 g/dL — ABNORMAL LOW (ref 3.5–5.0)
Alkaline Phosphatase: 47 U/L (ref 38–126)
Anion gap: 13 (ref 5–15)
BUN: 102 mg/dL — ABNORMAL HIGH (ref 8–23)
CO2: 23 mmol/L (ref 22–32)
Calcium: 8 mg/dL — ABNORMAL LOW (ref 8.9–10.3)
Chloride: 101 mmol/L (ref 98–111)
Creatinine, Ser: 4.09 mg/dL — ABNORMAL HIGH (ref 0.61–1.24)
GFR calc Af Amer: 15 mL/min — ABNORMAL LOW (ref >60)
GFR calc non Af Amer: 13 mL/min — ABNORMAL LOW (ref >60)
Glucose, Bld: 140 mg/dL — ABNORMAL HIGH (ref 70–99)
Potassium: 4.3 mmol/L (ref 3.5–5.1)
Sodium: 137 mmol/L (ref 135–145)
Total Bilirubin: 12.9 mg/dL — ABNORMAL HIGH (ref 0.3–1.2)
Total Protein: 6 g/dL — ABNORMAL LOW (ref 6.5–8.1)

## 2019-06-21 LAB — CBC
HCT: 31 % — ABNORMAL LOW (ref 39.0–52.0)
Hemoglobin: 10.4 g/dL — ABNORMAL LOW (ref 13.0–17.0)
MCH: 28.6 pg (ref 26.0–34.0)
MCHC: 33.5 g/dL (ref 30.0–36.0)
MCV: 85.2 fL (ref 80.0–100.0)
Platelets: 164 10*3/uL (ref 150–400)
RBC: 3.64 MIL/uL — ABNORMAL LOW (ref 4.22–5.81)
RDW: 16.8 % — ABNORMAL HIGH (ref 11.5–15.5)
WBC: 10.7 10*3/uL — ABNORMAL HIGH (ref 4.0–10.5)
nRBC: 0 % (ref 0.0–0.2)

## 2019-06-21 MED ORDER — SODIUM CHLORIDE 0.9 % IV BOLUS
250.0000 mL | Freq: Once | INTRAVENOUS | Status: AC
  Administered 2019-06-21: 250 mL via INTRAVENOUS

## 2019-06-21 NOTE — Progress Notes (Signed)
Pt had 11 beats of vtach this am, reported to MD on call, and pt:s BP is 86/55, MD also made aware with order to give NS bolus 257ml.

## 2019-06-21 NOTE — Progress Notes (Signed)
Progress Note  Patient Name: [name] Date of Encounter: 06/21/2019  Primary Cardiologist: Candee Furbish, MD   Subjective   Feeling better.  He has abdominal pain after eating but is otherwise well.  Breathing is at his baseline.   Inpatient Medications    Scheduled Meds:  aspirin EC  81 mg Oral Daily   Chlorhexidine Gluconate Cloth  6 each Topical Daily   feeding supplement (ENSURE ENLIVE)  237 mL Oral BID BM   finasteride  5 mg Oral Daily   insulin aspart  0-9 Units Subcutaneous Q4H   multivitamin with minerals  1 tablet Oral Daily   sodium chloride flush  3 mL Intravenous Once   tamsulosin  0.4 mg Oral Daily   Continuous Infusions:  sodium chloride 100 mL/hr at 06/20/19 1350   piperacillin-tazobactam (ZOSYN)  IV 2.25 g (06/21/19 0454)   PRN Meds: alum & mag hydroxide-simeth, ondansetron (ZOFRAN) IV, promethazine **OR** promethazine **OR** promethazine   Vital Signs    Vitals:   06/20/19 1422 06/20/19 1821 06/20/19 2005 06/21/19 0432  BP: 95/61 95/65 (!) 88/57 (!) 86/55  Pulse: 79 80 73 67  Resp: 20 17  16   Temp: (!) 97.4 F (36.3 C) 97.9 F (36.6 C) 97.8 F (36.6 C) (!) 97.4 F (36.3 C)  TempSrc: Oral Oral Oral Oral  SpO2: 100% 100% 99% 99%  Weight:      Height:        Intake/Output Summary (Last 24 hours) at 06/21/2019 1050 Last data filed at 06/21/2019 0432 Gross per 24 hour  Intake 120 ml  Output 375 ml  Net -255 ml   Last 3 Weights 06/19/2019 06/19/2019 02/12/2019  Weight (lbs) 198 lb 6.6 oz 198 lb 6.6 oz 190 lb  Weight (kg) 90 kg 90 kg 86.183 kg  Some encounter information is confidential and restricted. Go to Review Flowsheets activity to see all data.      Telemetry    Atrial fibrillation.  Rate <100 bpm.  NSVT up to 11 beats.  - Personally Reviewed  ECG    N/a - Personally Reviewed  Physical Exam   VS:  BP (!) 86/55 (BP Location: Left Arm)    Pulse 67    Temp (!) 97.4 F (36.3 C) (Oral)    Resp 16    Ht 5\' 11"   (1.803 m)    Wt 90 kg    SpO2 99%    BMI 27.67 kg/m  , BMI Body mass index is 27.67 kg/m. GENERAL:  Chronically ill-appearing HEENT: Pupils equal round and reactive, fundi not visualized, oral mucosa unremarkable NECK:  No jugular venous distention, waveform within normal limits, carotid upstroke brisk and symmetric, no bruits, no thyromegaly LYMPHATICS:  No cervical adenopathy LUNGS:  Clear to auscultation bilaterally HEART:  Irregularly irregular  PMI not displaced or sustained,S1 and S2 within normal limits, no S3, no S4, no clicks, no rubs, no murmurs ABD:  RUQ TTP.  No rebound/guarding.   EXT:  2 plus pulses throughout, 1+ woody edema, no cyanosis no clubbing SKIN:  No rashes no nodules NEURO:  Cranial nerves II through XII grossly intact, motor grossly intact throughout PSYCH:  Cognitively intact, oriented to person place and time   Labs    High Sensitivity Troponin:  No results for input(s): TROPONINIHS in the last 720 hours.    Chemistry Recent Labs  Lab 06/19/19 0819 06/19/19 1413 06/20/19 0616 06/21/19 0138  NA 136 137 137 137  K 4.0 4.1 4.2 4.3  CL 100 102 100 101  CO2 21* 21* 22 23  GLUCOSE 120* 118* 146* 140*  BUN 73* 77* 90* 102*  CREATININE 2.88* 3.26* 3.76* 4.09*  CALCIUM 8.5* 8.4* 8.1* 8.0*  PROT 6.9  --  6.2* 6.0*  ALBUMIN 2.2* 2.2* 1.8* 1.8*  AST 25  --  19 20  ALT 30  --  22 19  ALKPHOS 70  --  62 47  BILITOT 14.8*  --  12.8* 12.9*  GFRNONAA 20* 18* 15* 13*  GFRAA 24* 20* 17* 15*  ANIONGAP 15 14 15 13      Hematology Recent Labs  Lab 06/19/19 0819 06/20/19 0616 06/21/19 0138  WBC 9.2 10.5 10.7*  RBC 4.07* 3.99* 3.64*  HGB 11.7* 11.8* 10.4*  HCT 35.6* 33.7* 31.0*  MCV 87.5 84.5 85.2  MCH 28.7 29.6 28.6  MCHC 32.9 35.0 33.5  RDW 17.0* 16.5* 16.8*  PLT 211 184 164    BNP Recent Labs  Lab 06/18/19 2109  BNP 2,536.0*     DDimer No results for input(s): DDIMER in the last 168 hours.   Radiology    Ct Abdomen Pelvis Wo  Contrast  Result Date: 06/20/2019 CLINICAL DATA:  Concern for high-grade small bowel obstruction EXAM: CT ABDOMEN AND PELVIS WITHOUT CONTRAST TECHNIQUE: Multidetector CT imaging of the abdomen and pelvis was performed following the standard protocol without IV contrast. COMPARISON:  CT abdomen pelvis dated 06/19/2019 FINDINGS: Lower chest: Moderate bilateral pleural effusions with associated atelectasis are partially imaged. The heart is enlarged. Hepatobiliary: Multiple small hypoattenuating lesions in the liver are not significantly changed since 11/23/2017. And likely represent cysts. The gallbladder is decompressed. The common bile duct measures up to 10 mm in diameter. No distal common bile duct stone is identified on today's exam. Pancreas: Unremarkable. No pancreatic ductal dilatation or surrounding inflammatory changes. Spleen: Normal in size without focal abnormality. Adrenals/Urinary Tract: Adrenal glands are unremarkable. A 4 mm area of hyperdensity in the right kidney may represent a renal stone. Otherwise, the kidneys are normal, without renal calculi, focal lesion, or hydronephrosis. Bladder is decompressed with a Foley catheter. Stomach/Bowel: Stomach is within normal limits. Enteric contrast reaches the transverse colon. There is colonic diverticulosis. Appendix appears normal. Multiple borderline dilated loops of small bowel are seen throughout the mid abdomen. No discrete transition point is identified. Vascular/Lymphatic: Aortic atherosclerosis. No enlarged abdominal or pelvic lymph nodes. Reproductive: Prostate is unremarkable. Other: Abdominal ascites and anasarca are redemonstrated. No abdominal wall hernia is identified. 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 Degenerative changes are seen in the lumbar spine. IMPRESSION: 1. Multiple borderline dilated loops of small bowel throughout the mid abdomen. No discrete transition  point is identified. Findings may represent ileus or partial obstruction given that oral contrast reaches the transverse colon. 2. Moderate bilateral pleural effusions with associated atelectasis. 3. Abdominal ascites and anasarca. Aortic Atherosclerosis (ICD10-I70.0). Electronically Signed   By: Zerita Boers M.D.   On: 06/20/2019 21:34   Dg Ercp Biliary & Pancreatic Ducts  Result Date: 06/19/2019 CLINICAL DATA:  ERCP.  Choledocholithiasis. EXAM: ERCP TECHNIQUE: Multiple spot images obtained with the fluoroscopic device and submitted for interpretation post-procedure. COMPARISON:  CT abdomen pelvis-06/19/2019; image guided cholecystostomy tube placement-11/25/2017; cholangiogram via existing cholecystostomy tube-11/30/2017 FLUOROSCOPY TIME:  2 minutes, 34 seconds FINDINGS: Two spot intraoperative fluoroscopic images during ERCP are provided for review Initial image demonstrates an ERCP probe overlying the right upper abdominal quadrant. There is selective cannulation and  opacification of common bile duct which appears moderately dilated. There are several ill-defined filling defects seen within the CBD worrisome for choledocholithiasis. Completion image demonstrates apparent interval stone extraction with persistent dilatation of the CBD. There is faint opacification of the cystic duct and gallbladder lumen. No definitive opacification of the pancreatic duct. IMPRESSION: ERCP with findings of choledocholithiasis. These images were submitted for radiologic interpretation only. Please see the procedural report for the amount of contrast and the fluoroscopy time utilized. Electronically Signed   By: Sandi Mariscal M.D.   On: 06/19/2019 13:31   Dg Esophagus W Single Cm (sol Or Thin Ba)  Result Date: 06/20/2019 CLINICAL DATA:  Post ERCP, history of globus sensation. EXAM: ESOPHOGRAM/BARIUM SWALLOW TECHNIQUE: Single contrast examination was performed using water-soluble contrast. FLUOROSCOPY TIME:  Fluoroscopy  Time:  1 minutes 18 seconds Radiation Exposure Index (if provided by the fluoroscopic device): 7 mGy Number of Acquired Spot Images: 0 COMPARISON:  None. FINDINGS: Only a small amount of water-soluble contrast could be swallowed due to patient comfort. Limited assessment shows no signs of esophageal obstruction or leak. Luminal characteristics are difficult to evaluate given small volume. There is likely also a small hiatal hernia. Imaging of the upper abdomen shows marked distention of some akin small-bowel loops. Due to patient nausea and the marked distension of bowel in the upper abdomen the study was terminated at this time. IMPRESSION: Limited esophageal assessment without signs of gross leak or narrowing. Only a small amount of water-soluble contrast could be swallowed. Ileus versus bowel obstruction incidentally noted in the upper abdomen. This and patient comfort precluded further evaluation. Following resolution of acute symptoms repeat esophageal assessment could be performed. Electronically Signed   By: Zetta Bills M.D.   On: 06/20/2019 12:56    Cardiac Studies   TTE: 10/2017  Study Conclusions  - Left ventricle: The cavity size was normal. Wall thickness was increased in a pattern of moderate LVH. Systolic function was severely reduced. The estimated ejection fraction was in the range of 20% to 25%. Diffuse hypokinesis. The study is not technically sufficient to allow evaluation of LV diastolic function. - Mitral valve: Mildly thickened leaflets . There was trivial regurgitation. - Left atrium: The atrium was normal in size. - Right ventricle: The cavity size was mildly dilated. - Tricuspid valve: There was trivial regurgitation. - Pulmonary arteries: PA peak pressure: 26 mm Hg (S). - Inferior vena cava: The vessel was dilated. The respirophasic diameter changes were blunted (<50%), consistent with elevated central venous pressure.  Impressions:  -  Compared to a prior study in 2016, the LVEF is slightly lower at 20-25%.  Patient Profile     Mr. Rawl is a 16M with chronic systolic and diastolic heart failure, diabetes, CKD III, EtOH abuse, hypertension and hyperlipidemia admitted with recurrent choledocholithiasis and cholangitis.    Assessment & Plan    # Chronic systolic and diastolic heart failure: # Essential hypertension: LVEF chronically low.  20-25% in 2019.  He had AKI in the setting of volume depletion and has been getting IV fluid.  Currently he is compensated and actually volume depleted.  He reports a prior stress test at an outside hospital.  BP has been labile and low lately.  Carveilol on hold.  Will add ARB as BP and renal function permit.  No diuretics 2/2 AKI and hypotension.  Will repeat echo to ensure that there hasn't been progressive disease and to better assess volume status.   He has received over 2.5L  and is starting to be volume overloaded.  Will stop continuous fluids.   # NSVT:  Asymptomatic.   Keep K>4, Mg >2. Beta blocker held for hypotension.   # Acute on chronic renal failure: Worsened in the setting of poor oral intake and diuretics.  Lasix and Entresto are on hold.  He has received over 2.5L and is starting to be volume overloaded.  Will stop continuous fluids.   Agree with nephrology assessment.      For questions or updates, please contact [facility] Please consult [email or web address] for contact info under        Signed, Skeet Latch, MD  06/21/2019, 10:50 AM

## 2019-06-21 NOTE — Progress Notes (Signed)
Attempted placing Coude cath without success. Also was attempted by day shift nurse at shift change. Catheter tip coils up and exits penis. Penis is extremely swollen and tender to touch. Pt states that is is always changed in urology office and was exchanged this past Monday. [place] NP notified at this time.

## 2019-06-21 NOTE — Progress Notes (Signed)
Attempted #16 straight cath per [facility] NP. Unsuccessful. Called [phone] for an additional attempt.

## 2019-06-21 NOTE — Consult Note (Signed)
Reason for Consult: Renal failure  Referring Physician:  Dr. Norina Buzzard  Chief Complaint:  Abdominal Pain  Assessment/Plan: 1. 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 He was not given NSAID's or IV contrast and even though he's feeling much better his BP's are lower today. I did not see any pigmented casts on urine microscopy but there are many WBC's with no dysmorphic RBC's. This AKI is likely hemodynamically mediated with the soft BP's today + cholangitis (pus seen on ERCP); even without pigmented casts he may have ATN. Fortunately he's still making urine with no evidence of overload as far as his respiratory status. He's also net even during this hospitalization. - No acute indication for RRT and hopefully we don't have to make that decision as at this time he does not want acute or chronic dialysis. - Would continue to hold the the Carvedilol given the very soft pressures today to improve renal perfusion. - No e/o obstruction on CT and renal u/s will show incr hyperechogenicity with no obstruction. - He just had the foley changed out but I still suspect colonization of his bladder vs a true urosepsis type of picture. Also already on Zosyn. - AIN is not likely given the short period of exposure to the Zosyn. - Unfortunately his renal function may continue to worsen with the relative hypotension even though he feels better. - For now continue supportive therapy, avoid nephrotoxins as you are already doing; if renal function continues to worsen then will treat as ATN and broach possibility and interest in acute temporary dialysis again.  2. CHF - carvedilol and Lasix on hold 3. DM 4. BPH with chronic indwelling foley; just changed out Monday. 5. Abd pain secondary to choledocholithiasis and cholangitis.   HPI: [name] is an 78 y.o. male with HTN HLD DM CKD3 CHF (EF 25-30%) ETOH who had a perc cholecystostomy tube on 11/25/2017  With repeat  cholangiograms on 12/27/2017 and 02/02/2018 showing cholelithiasis + patent cystic and CBD -> removal of drain on 02/02/2018. He also had an admission for CHF 01/2019. He's presenting with abdominal pain, emesis, fevers with pain described as lower abd migrating to the RUQ; ultrasound showed intrahepatic biliary ductal dilatation with a CBD and CTAP w/o contrast showed a 58mm stone at the distal duct level with a TB of 13. Patient had a ERCP 10/20 with sphincterotomy and bile duct stone removal with purulent cholangitis noted with ERCP; he has been on Zosyn for duration of hospitalization. Received Lasix 120mg  and 40mg  on 10/20 10/21, respectively.  He has CKDIII with a BL creatinine of 2-2.2 with a chronic indwelling foley given not a candidate for prostate surgery; obstruction for BPH. He has the foley changed out monthly with the last time on 06/18/2019. He denies any NSAID use or fullness or flank tenderness.  ROS Pertinent items are noted in HPI.  Chemistry and CBC: Creatinine, Ser  Date/Time Value Ref Range Status  06/21/2019 01:38 AM 4.09 (H) 0.61 - 1.24 mg/dL Final  06/20/2019 06:16 AM 3.76 (H) 0.61 - 1.24 mg/dL Final  06/19/2019 02:13 PM 3.26 (H) 0.61 - 1.24 mg/dL Final  06/19/2019 08:19 AM 2.88 (H) 0.61 - 1.24 mg/dL Final  06/18/2019 08:54 PM 3.09 (H) 0.61 - 1.24 mg/dL Final  02/12/2019 05:08 AM 2.23 (H) 0.61 - 1.24 mg/dL Final  02/11/2019 09:46 AM 2.14 (H) 0.61 - 1.24 mg/dL Final  02/10/2019 05:11 AM 2.09 (H) 0.61 - 1.24 mg/dL Final  02/09/2019 06:54  AM 2.24 (H) 0.61 - 1.24 mg/dL Final  02/08/2019 04:14 AM 2.50 (H) 0.61 - 1.24 mg/dL Final  02/07/2019 04:17 AM 2.55 (H) 0.61 - 1.24 mg/dL Final  02/06/2019 04:35 AM 2.61 (H) 0.61 - 1.24 mg/dL Final  02/05/2019 06:00 AM 2.46 (H) 0.61 - 1.24 mg/dL Final  02/04/2019 09:06 AM 2.38 (H) 0.61 - 1.24 mg/dL Final  09/13/2018 06:04 AM 1.50 (H) 0.61 - 1.24 mg/dL Final  09/03/2018 04:06 PM 1.56 (H) 0.61 - 1.24 mg/dL Final  08/16/2018 09:40 AM 1.65 (H)  0.61 - 1.24 mg/dL Final  03/04/2018 03:17 PM 1.81 (H) 0.61 - 1.24 mg/dL Final  11/26/2017 03:45 AM 1.96 (H) 0.61 - 1.24 mg/dL Final  11/25/2017 05:00 AM 2.06 (H) 0.61 - 1.24 mg/dL Final  11/24/2017 02:30 AM 1.92 (H) 0.61 - 1.24 mg/dL Final  11/23/2017 10:32 AM 1.65 (H) 0.61 - 1.24 mg/dL Final  09/24/2016 08:38 AM 1.40 (H) 0.61 - 1.24 mg/dL Final  02/27/2015 02:55 AM 1.77 (H) 0.61 - 1.24 mg/dL Final  02/26/2015 07:11 AM 1.72 (H) 0.61 - 1.24 mg/dL Final  02/26/2015 02:02 AM 1.56 (H) 0.61 - 1.24 mg/dL Final   Recent Labs  Lab 06/18/19 2054 06/19/19 0819 06/19/19 1413 06/20/19 0616 06/21/19 0138  NA 133* 136 137 137 137  K 4.1 4.0 4.1 4.2 4.3  CL 98 100 102 100 101  CO2 20* 21* 21* 22 23  GLUCOSE 127* 120* 118* 146* 140*  BUN 69* 73* 77* 90* 102*  CREATININE 3.09* 2.88* 3.26* 3.76* 4.09*  CALCIUM 8.5* 8.5* 8.4* 8.1* 8.0*  PHOS  --   --  5.9*  --   --    Recent Labs  Lab 06/18/19 2054 06/19/19 0819 06/20/19 0616 06/21/19 0138  WBC 9.9 9.2 10.5 10.7*  HGB 12.1* 11.7* 11.8* 10.4*  HCT 36.3* 35.6* 33.7* 31.0*  MCV 88.3 87.5 84.5 85.2  PLT 226 211 184 164   Liver Function Tests: Recent Labs  Lab 06/19/19 0819 06/19/19 1413 06/20/19 0616 06/21/19 0138  AST 25  --  19 20  ALT 30  --  22 19  ALKPHOS 70  --  62 47  BILITOT 14.8*  --  12.8* 12.9*  PROT 6.9  --  6.2* 6.0*  ALBUMIN 2.2* 2.2* 1.8* 1.8*   Recent Labs  Lab 06/18/19 2054  LIPASE 220*   No results for input(s): AMMONIA in the last 168 hours. Cardiac Enzymes: No results for input(s): CKTOTAL, CKMB, CKMBINDEX, TROPONINI in the last 168 hours. Iron Studies: No results for input(s): IRON, TIBC, TRANSFERRIN, FERRITIN in the last 72 hours. PT/INR: @LABRCNTIP (inr:5)  Xrays/Other Studies: ) Results for orders placed or performed during the hospital encounter of 06/18/19 (from the past 48 hour(s))  CBG monitoring, ED     Status: Abnormal   Collection Time: 06/19/19  2:11 PM  Result Value Ref Range    Glucose-Capillary 113 (H) 70 - 99 mg/dL  Renal function panel     Status: Abnormal   Collection Time: 06/19/19  2:13 PM  Result Value Ref Range   Sodium 137 135 - 145 mmol/L   Potassium 4.1 3.5 - 5.1 mmol/L   Chloride 102 98 - 111 mmol/L   CO2 21 (L) 22 - 32 mmol/L   Glucose, Bld 118 (H) 70 - 99 mg/dL   BUN 77 (H) 8 - 23 mg/dL   Creatinine, Ser 3.26 (H) 0.61 - 1.24 mg/dL   Calcium 8.4 (L) 8.9 - 10.3 mg/dL   Phosphorus 5.9 (H) 2.5 -  4.6 mg/dL   Albumin 2.2 (L) 3.5 - 5.0 g/dL   GFR calc non Af Amer 18 (L) >60 mL/min   GFR calc Af Amer 20 (L) >60 mL/min   Anion gap 14 5 - 15    Comment: Performed at [facility] [address] [place], [place] [address]  Magnesium     Status: None   Collection Time: 06/19/19  2:13 PM  Result Value Ref Range   Magnesium 2.1 1.7 - 2.4 mg/dL    Comment: Performed at [facility] [address] [place], [place] [address]  CBG monitoring, ED     Status: Abnormal   Collection Time: 06/19/19  5:24 PM  Result Value Ref Range   Glucose-Capillary 110 (H) 70 - 99 mg/dL  CBG monitoring, ED     Status: Abnormal   Collection Time: 06/19/19  8:09 PM  Result Value Ref Range   Glucose-Capillary 128 (H) 70 - 99 mg/dL   Comment 1 Notify RN    Comment 2 Document in Chart   Glucose, capillary     Status: Abnormal   Collection Time: 06/19/19 11:50 PM  Result Value Ref Range   Glucose-Capillary 132 (H) 70 - 99 mg/dL  Glucose, capillary     Status: Abnormal   Collection Time: 06/20/19  3:29 AM  Result Value Ref Range   Glucose-Capillary 117 (H) 70 - 99 mg/dL  CBC     Status: Abnormal   Collection Time: 06/20/19  6:16 AM  Result Value Ref Range   WBC 10.5 4.0 - 10.5 K/uL   RBC 3.99 (L) 4.22 - 5.81 MIL/uL   Hemoglobin 11.8 (L) 13.0 - 17.0 g/dL   HCT 33.7 (L) 39.0 - 52.0 %   MCV 84.5 80.0 - 100.0 fL   MCH 29.6 26.0 - 34.0 pg   MCHC 35.0 30.0 - 36.0 g/dL   RDW 16.5 (H) 11.5 - 15.5 %   Platelets 184 150 - 400 K/uL   nRBC 0.0 0.0 - 0.2 %     Comment: Performed at [facility], [place] [address], [place], [place] [address]  Comprehensive metabolic panel     Status: Abnormal   Collection Time: 06/20/19  6:16 AM  Result Value Ref Range   Sodium 137 135 - 145 mmol/L   Potassium 4.2 3.5 - 5.1 mmol/L   Chloride 100 98 - 111 mmol/L   CO2 22 22 - 32 mmol/L   Glucose, Bld 146 (H) 70 - 99 mg/dL   BUN 90 (H) 8 - 23 mg/dL   Creatinine, Ser 3.76 (H) 0.61 - 1.24 mg/dL   Calcium 8.1 (L) 8.9 - 10.3 mg/dL   Total Protein 6.2 (L) 6.5 - 8.1 g/dL   Albumin 1.8 (L) 3.5 - 5.0 g/dL   AST 19 15 - 41 U/L   ALT 22 0 - 44 U/L   Alkaline Phosphatase 62 38 - 126 U/L   Total Bilirubin 12.8 (H) 0.3 - 1.2 mg/dL   GFR calc non Af Amer 15 (L) >60 mL/min   GFR calc Af Amer 17 (L) >60 mL/min   Anion gap 15 5 - 15    Comment: Performed at [facility], [place] [address] [place], [place] [address]  Glucose, capillary     Status: Abnormal   Collection Time: 06/20/19  8:47 AM  Result Value Ref Range   Glucose-Capillary 134 (H) 70 - 99 mg/dL   Comment 1 Notify RN    Comment  2 Document in Chart   Glucose, capillary     Status: Abnormal   Collection Time: 06/20/19 12:44 PM  Result Value Ref Range   Glucose-Capillary 121 (H) 70 - 99 mg/dL  Glucose, capillary     Status: Abnormal   Collection Time: 06/20/19  5:05 PM  Result Value Ref Range   Glucose-Capillary 125 (H) 70 - 99 mg/dL   Comment 1 Notify RN    Comment 2 Document in Chart   Glucose, capillary     Status: Abnormal   Collection Time: 06/20/19  8:00 PM  Result Value Ref Range   Glucose-Capillary 104 (H) 70 - 99 mg/dL  Glucose, capillary     Status: Abnormal   Collection Time: 06/21/19 12:13 AM  Result Value Ref Range   Glucose-Capillary 131 (H) 70 - 99 mg/dL  Comprehensive metabolic panel     Status: Abnormal   Collection Time: 06/21/19  1:38 AM  Result Value Ref Range   Sodium 137 135 - 145 mmol/L   Potassium 4.3 3.5 - 5.1 mmol/L   Chloride 101 98 - 111 mmol/L   CO2 23 22 - 32  mmol/L   Glucose, Bld 140 (H) 70 - 99 mg/dL   BUN 102 (H) 8 - 23 mg/dL   Creatinine, Ser 4.09 (H) 0.61 - 1.24 mg/dL   Calcium 8.0 (L) 8.9 - 10.3 mg/dL   Total Protein 6.0 (L) 6.5 - 8.1 g/dL   Albumin 1.8 (L) 3.5 - 5.0 g/dL   AST 20 15 - 41 U/L   ALT 19 0 - 44 U/L   Alkaline Phosphatase 47 38 - 126 U/L   Total Bilirubin 12.9 (H) 0.3 - 1.2 mg/dL   GFR calc non Af Amer 13 (L) >60 mL/min   GFR calc Af Amer 15 (L) >60 mL/min   Anion gap 13 5 - 15    Comment: Performed at [facility], [address], [place], [place] [address]  CBC     Status: Abnormal   Collection Time: 06/21/19  1:38 AM  Result Value Ref Range   WBC 10.7 (H) 4.0 - 10.5 K/uL   RBC 3.64 (L) 4.22 - 5.81 MIL/uL   Hemoglobin 10.4 (L) 13.0 - 17.0 g/dL   HCT 31.0 (L) 39.0 - 52.0 %   MCV 85.2 80.0 - 100.0 fL   MCH 28.6 26.0 - 34.0 pg   MCHC 33.5 30.0 - 36.0 g/dL   RDW 16.8 (H) 11.5 - 15.5 %   Platelets 164 150 - 400 K/uL   nRBC 0.0 0.0 - 0.2 %    Comment: Performed at [facility], [place] [address] [place], [place] [address]  Glucose, capillary     Status: Abnormal   Collection Time: 06/21/19  4:27 AM  Result Value Ref Range   Glucose-Capillary 136 (H) 70 - 99 mg/dL  Glucose, capillary     Status: Abnormal   Collection Time: 06/21/19  8:27 AM  Result Value Ref Range   Glucose-Capillary 100 (H) 70 - 99 mg/dL   Ct Abdomen Pelvis Wo Contrast  Result Date: 06/20/2019 CLINICAL DATA:  Concern for high-grade small bowel obstruction EXAM: CT ABDOMEN AND PELVIS WITHOUT CONTRAST TECHNIQUE: Multidetector CT imaging of the abdomen and pelvis was performed following the standard protocol without IV contrast. COMPARISON:  CT abdomen pelvis dated 06/19/2019 FINDINGS: Lower chest: Moderate bilateral pleural effusions with associated atelectasis are partially imaged. The heart is enlarged. Hepatobiliary: Multiple small hypoattenuating lesions in the liver are not  significantly changed since 11/23/2017. And likely represent  cysts. The gallbladder is decompressed. The common bile duct measures up to 10 mm in diameter. No distal common bile duct stone is identified on today's exam. Pancreas: Unremarkable. No pancreatic ductal dilatation or surrounding inflammatory changes. Spleen: Normal in size without focal abnormality. Adrenals/Urinary Tract: Adrenal glands are unremarkable. A 4 mm area of hyperdensity in the right kidney may represent a renal stone. Otherwise, the kidneys are normal, without renal calculi, focal lesion, or hydronephrosis. Bladder is decompressed with a Foley catheter. Stomach/Bowel: Stomach is within normal limits. Enteric contrast reaches the transverse colon. There is colonic diverticulosis. Appendix appears normal. Multiple borderline dilated loops of small bowel are seen throughout the mid abdomen. No discrete transition point is identified. Vascular/Lymphatic: Aortic atherosclerosis. No enlarged abdominal or pelvic lymph nodes. Reproductive: Prostate is unremarkable. Other: Abdominal ascites and anasarca are redemonstrated. No abdominal wall hernia is identified. 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 Degenerative changes are seen in the lumbar spine. IMPRESSION: 1. Multiple borderline dilated loops of small bowel throughout the mid abdomen. No discrete transition point is identified. Findings may represent ileus or partial obstruction given that oral contrast reaches the transverse colon. 2. Moderate bilateral pleural effusions with associated atelectasis. 3. Abdominal ascites and anasarca. Aortic Atherosclerosis (ICD10-I70.0). Electronically Signed   By: Zerita Boers M.D.   On: 06/20/2019 21:34   Dg Ercp Biliary & Pancreatic Ducts  Result Date: 06/19/2019 CLINICAL DATA:  ERCP.  Choledocholithiasis. EXAM: ERCP TECHNIQUE: Multiple spot images obtained with the fluoroscopic device and submitted for interpretation post-procedure.  COMPARISON:  CT abdomen pelvis-06/19/2019; image guided cholecystostomy tube placement-11/25/2017; cholangiogram via existing cholecystostomy tube-11/30/2017 FLUOROSCOPY TIME:  2 minutes, 34 seconds FINDINGS: Two spot intraoperative fluoroscopic images during ERCP are provided for review Initial image demonstrates an ERCP probe overlying the right upper abdominal quadrant. There is selective cannulation and opacification of common bile duct which appears moderately dilated. There are several ill-defined filling defects seen within the CBD worrisome for choledocholithiasis. Completion image demonstrates apparent interval stone extraction with persistent dilatation of the CBD. There is faint opacification of the cystic duct and gallbladder lumen. No definitive opacification of the pancreatic duct. IMPRESSION: ERCP with findings of choledocholithiasis. These images were submitted for radiologic interpretation only. Please see the procedural report for the amount of contrast and the fluoroscopy time utilized. Electronically Signed   By: Sandi Mariscal M.D.   On: 06/19/2019 13:31   Dg Esophagus W Single Cm (sol Or Thin Ba)  Result Date: 06/20/2019 CLINICAL DATA:  Post ERCP, history of globus sensation. EXAM: ESOPHOGRAM/BARIUM SWALLOW TECHNIQUE: Single contrast examination was performed using water-soluble contrast. FLUOROSCOPY TIME:  Fluoroscopy Time:  1 minutes 18 seconds Radiation Exposure Index (if provided by the fluoroscopic device): 7 mGy Number of Acquired Spot Images: 0 COMPARISON:  None. FINDINGS: Only a small amount of water-soluble contrast could be swallowed due to patient comfort. Limited assessment shows no signs of esophageal obstruction or leak. Luminal characteristics are difficult to evaluate given small volume. There is likely also a small hiatal hernia. Imaging of the upper abdomen shows marked distention of some akin small-bowel loops. Due to patient nausea and the marked distension of bowel in the  upper abdomen the study was terminated at this time. IMPRESSION: Limited esophageal assessment without signs of gross leak or narrowing. Only a small amount of water-soluble contrast could be swallowed. Ileus versus bowel obstruction incidentally noted in the upper abdomen.  This and patient comfort precluded further evaluation. Following resolution of acute symptoms repeat esophageal assessment could be performed. Electronically Signed   By: Zetta Bills M.D.   On: 06/20/2019 12:56    PMH:   Past Medical History:  Diagnosis Date  . Acute cholecystitis 11/23/2017  . Acute respiratory failure with hypoxia ([place])   . Acute upper back pain   . AKI (acute kidney injury) ([place])   . Alcohol abuse 02/26/2015  . Anemia, deficiency   . Back pain    LUMBOSACRAL  . Bronchitis, complicated   . Cardiomegaly   . Chronic combined systolic and diastolic CHF (congestive heart failure) ([place])   . Chronic pain    HIGH RISK MED USE COMPREHENSIVE HIGH RISK  . CKD (chronic kidney disease), stage III   . Demand ischemia ([place])    a. minimally elevated troponin peak 0.11 in 2016, felt demand ischemia. Cath offered but pt wished to have done back in [place].  . Diabetes mellitus without complication ([place])   . Edema, leg   . Elevated troponin 02/26/2015  . Essential hypertension   . Fatigue   . Heart abnormality   . Hyperlipidemia   . Hypertension   . Neck pain, acute   . PVD (peripheral vascular disease) ([place])   . Renal lesion 11/23/2017  . SOB (shortness of breath) on exertion   . Solitary kidney   . Vitamin D deficiency     PSH:   Past Surgical History:  Procedure Laterality Date  . ENDOSCOPIC RETROGRADE CHOLANGIOPANCREATOGRAPHY (ERCP) WITH PROPOFOL N/A 06/19/2019   Procedure: ENDOSCOPIC RETROGRADE CHOLANGIOPANCREATOGRAPHY (ERCP) WITH PROPOFOL;  Surgeon: Milus Banister, MD;  Location: [facility] ENDOSCOPY;  Service: Endoscopy;  Laterality: N/A;  . IR CHOLANGIOGRAM EXISTING TUBE  12/27/2017  . IR PERC  CHOLECYSTOSTOMY  11/25/2017  . IR RADIOLOGIST EVAL & MGMT  02/02/2018  . REMOVAL OF STONES  06/19/2019   Procedure: REMOVAL OF STONES;  Surgeon: Milus Banister, MD;  Location: [facility] ENDOSCOPY;  Service: Endoscopy;;  . Joan Mayans  06/19/2019   Procedure: Joan Mayans;  Surgeon: Milus Banister, MD;  Location: [facility] ENDOSCOPY;  Service: Endoscopy;;    Allergies: No Known Allergies  Medications:   Prior to Admission medications   Medication Sig Start Date End Date Taking? Authorizing Provider  carvedilol (COREG) 12.5 MG tablet Take 1 tablet (12.5 mg total) by mouth 2 (two) times daily. Patient taking differently: Take 6.25 mg by mouth 2 (two) times daily.  03/08/18 06/19/19 Yes Daune Perch, NP  finasteride (PROSCAR) 5 MG tablet Take 1 tablet (5 mg total) by mouth daily. 09/03/18  Yes Fawze, Mina A, PA-C  furosemide (LASIX) 40 MG tablet Take 3 tablets (120 mg total) by mouth daily. Patient taking differently: Take 40 mg by mouth daily.  02/12/19  Yes [name], MD    Discontinued Meds:   Medications Discontinued During This Encounter  Medication Reason  . carvedilol (COREG) tablet 6.25 mg   . indomethacin (INDOCIN) 50 MG suppository Patient Discharge  . Omnipaque 300 mg/mL (50 mL) in 0.9% normal saline (50 mL) Patient Discharge  . fentaNYL (SUBLIMAZE) 100 MCG/2ML injection Returned to ADS  . tamsulosin (FLOMAX) 0.4 MG CAPS capsule Patient Preference  . Multiple Vitamin (MULTIVITAMIN) tablet Patient Preference  . ENTRESTO 24-26 MG Patient Preference  . aspirin EC 81 MG tablet Patient Preference  . sacubitril-valsartan (ENTRESTO) 24-26 mg per tablet   . furosemide (LASIX) tablet 120 mg   . furosemide (LASIX) tablet 40 mg   .  piperacillin-tazobactam (ZOSYN) IVPB 3.375 g   . ondansetron (ZOFRAN) injection 4 mg   . indomethacin (INDOCIN) 50 MG suppository 100 mg   . HYDROmorphone (DILAUDID) injection 0.5 mg   . carvedilol (COREG) tablet 6.25 mg     Social History:  reports that he  quit smoking about 11 years ago. His smoking use included cigarettes. He has never used smokeless tobacco. He reports current alcohol use. He reports that he does not use drugs.  Family History:   Family History  Problem Relation Age of Onset  . Other Mother        died in her sleep at 29  . Hypertension Sister   . Hyperlipidemia Sister   . Diabetes Mellitus II Sister   . Heart disease Sister   . CAD Neg Hx        neg hx premature CAD    Blood pressure (!) 86/55, pulse 67, temperature (!) 97.4 F (36.3 C), temperature source Oral, resp. rate 16, height 5\' 11"  (1.803 m), weight 90 kg, SpO2 99 %. General appearance: alert, cooperative and appears stated age Head: Normocephalic, without obvious abnormality, atraumatic Eyes: negative Neck: no adenopathy, no carotid bruit, supple, symmetrical, trachea midline and thyroid not enlarged, symmetric, no tenderness/mass/nodules Back: symmetric, no curvature. ROM normal. No CVA tenderness. Resp: clear to auscultation bilaterally Chest wall: no tenderness Cardio: S1, S2 normal GI: soft, non-tender; bowel sounds normal; no masses,  no organomegaly Extremities: edema woody edema Pulses: 2+ and symmetric Skin: Skin color, texture, turgor normal. No rashes or lesions       LIN, Hunt Oris, MD 06/21/2019, 10:33 AM

## 2019-06-21 NOTE — Plan of Care (Signed)
  Problem: Coping: Goal: Level of anxiety will decrease Outcome: Progressing   Problem: Pain Managment: Goal: General experience of comfort will improve Outcome: Progressing   Problem: Safety: Goal: Ability to remain free from injury will improve Outcome: Progressing   

## 2019-06-21 NOTE — Progress Notes (Addendum)
Daily Rounding Note  06/21/2019, 8:43 AM  LOS: 2 days   SUBJECTIVE:   Chief complaint:    Choledocholithiasis.   11 beat run of V. tach this morning, BP to 86/55, he was subsequently bolused with 250 mL of normal saline. Pt still feels weak.  Appetite remains depressed.  No nausea.  No abdominal pain just some mild discomfort with abdominal palpation.  C/o globus sensation and dysphagia ever since the chole tube placed in 10/2018.  He also got stuck in his central sternal area and eventually closed.  He does not have regurgitation. Movement this morning.  OBJECTIVE:         Vital signs in last 24 hours:    Temp:  [97.4 F (36.3 C)-97.9 F (36.6 C)] 97.4 F (36.3 C) (10/22 0432) Pulse Rate:  [67-95] 67 (10/22 0432) Resp:  [16-20] 16 (10/22 0432) BP: (86-126)/(55-73) 86/55 (10/22 0432) SpO2:  [99 %-100 %] 99 % (10/22 0432) Last BM Date: 06/20/19 Filed Weights   06/19/19 0103 06/19/19 1128  Weight: 90 kg 90 kg   General: Chronically and acutely ill-appearing.  Sitting in bedside chair with breakfast meal in front of him. Heart: RRR Chest: Greatly diminished breath sounds throughout.  Absent breath sounds in the right base.  Somewhat dyspneic with speaking.  No cough. Abdomen: Soft.  Obese, slightly protuberant, nontender.  Bowel sounds hypoactive but normal quality. Extremities: No CCE. Neuro/Psych: Oriented to place, time, self.  Follows commands.  No tremors.  Intake/Output from previous day: 10/21 0701 - 10/22 0700 In: 240 [P.O.:240] Out: 375 [Urine:375]  Intake/Output this shift: No intake/output data recorded.  Lab Results: Recent Labs    06/19/19 0819 06/20/19 0616 06/21/19 0138  WBC 9.2 10.5 10.7*  HGB 11.7* 11.8* 10.4*  HCT 35.6* 33.7* 31.0*  PLT 211 184 164   BMET Recent Labs    06/19/19 1413 06/20/19 0616 06/21/19 0138  NA 137 137 137  K 4.1 4.2 4.3  CL 102 100 101  CO2 21* 22 23    GLUCOSE 118* 146* 140*  BUN 77* 90* 102*  CREATININE 3.26* 3.76* 4.09*  CALCIUM 8.4* 8.1* 8.0*   LFT Recent Labs    06/19/19 0819 06/19/19 1413 06/20/19 0616 06/21/19 0138  PROT 6.9  --  6.2* 6.0*  ALBUMIN 2.2* 2.2* 1.8* 1.8*  AST 25  --  19 20  ALT 30  --  22 19  ALKPHOS 70  --  62 47  BILITOT 14.8*  --  12.8* 12.9*   PT/INR Recent Labs    06/19/19 0036  LABPROT 17.6*  INR 1.5*   Hepatitis Panel No results for input(s): HEPBSAG, HCVAB, HEPAIGM, HEPBIGM in the last 72 hours.  Studies/Results: Ct Abdomen Pelvis Wo Contrast  Result Date: 06/20/2019 CLINICAL DATA:  Concern for high-grade small bowel obstruction EXAM: CT ABDOMEN AND PELVIS WITHOUT CONTRAST TECHNIQUE: Multidetector CT imaging of the abdomen and pelvis was performed following the standard protocol without IV contrast. COMPARISON:  CT abdomen pelvis dated 06/19/2019 FINDINGS: Lower chest: Moderate bilateral pleural effusions with associated atelectasis are partially imaged. The heart is enlarged. Hepatobiliary: Multiple small hypoattenuating lesions in the liver are not significantly changed since 11/23/2017. And likely represent cysts. The gallbladder is decompressed. The common bile duct measures up to 10 mm in diameter. No distal common bile duct stone is identified on today's exam. Pancreas: Unremarkable. No pancreatic ductal dilatation or surrounding inflammatory changes. Spleen: Normal in size without focal  abnormality. Adrenals/Urinary Tract: Adrenal glands are unremarkable. A 4 mm area of hyperdensity in the right kidney may represent a renal stone. Otherwise, the kidneys are normal, without renal calculi, focal lesion, or hydronephrosis. Bladder is decompressed with a Foley catheter. Stomach/Bowel: Stomach is within normal limits. Enteric contrast reaches the transverse colon. There is colonic diverticulosis. Appendix appears normal. Multiple borderline dilated loops of small bowel are seen throughout the mid  abdomen. No discrete transition point is identified. Vascular/Lymphatic: Aortic atherosclerosis. No enlarged abdominal or pelvic lymph nodes. Reproductive: Prostate is unremarkable. Other: Abdominal ascites and anasarca are redemonstrated. No abdominal wall hernia is identified. 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 Degenerative changes are seen in the lumbar spine. IMPRESSION: 1. Multiple borderline dilated loops of small bowel throughout the mid abdomen. No discrete transition point is identified. Findings may represent ileus or partial obstruction given that oral contrast reaches the transverse colon. 2. Moderate bilateral pleural effusions with associated atelectasis. 3. Abdominal ascites and anasarca. Aortic Atherosclerosis (ICD10-I70.0). Electronically Signed   By: Zerita Boers M.D.   On: 06/20/2019 21:34   Dg Ercp Biliary & Pancreatic Ducts  Result Date: 06/19/2019 CLINICAL DATA:  ERCP.  Choledocholithiasis. EXAM: ERCP TECHNIQUE: Multiple spot images obtained with the fluoroscopic device and submitted for interpretation post-procedure. COMPARISON:  CT abdomen pelvis-06/19/2019; image guided cholecystostomy tube placement-11/25/2017; cholangiogram via existing cholecystostomy tube-11/30/2017 FLUOROSCOPY TIME:  2 minutes, 34 seconds FINDINGS: Two spot intraoperative fluoroscopic images during ERCP are provided for review Initial image demonstrates an ERCP probe overlying the right upper abdominal quadrant. There is selective cannulation and opacification of common bile duct which appears moderately dilated. There are several ill-defined filling defects seen within the CBD worrisome for choledocholithiasis. Completion image demonstrates apparent interval stone extraction with persistent dilatation of the CBD. There is faint opacification of the cystic duct and gallbladder lumen. No definitive opacification of the pancreatic duct.  IMPRESSION: ERCP with findings of choledocholithiasis. These images were submitted for radiologic interpretation only. Please see the procedural report for the amount of contrast and the fluoroscopy time utilized. Electronically Signed   By: Sandi Mariscal M.D.   On: 06/19/2019 13:31   Dg Esophagus W Single Cm (sol Or Thin Ba)  Result Date: 06/20/2019 CLINICAL DATA:  Post ERCP, history of globus sensation. EXAM: ESOPHOGRAM/BARIUM SWALLOW TECHNIQUE: Single contrast examination was performed using water-soluble contrast. FLUOROSCOPY TIME:  Fluoroscopy Time:  1 minutes 18 seconds Radiation Exposure Index (if provided by the fluoroscopic device): 7 mGy Number of Acquired Spot Images: 0 COMPARISON:  None. FINDINGS: Only a small amount of water-soluble contrast could be swallowed due to patient comfort. Limited assessment shows no signs of esophageal obstruction or leak. Luminal characteristics are difficult to evaluate given small volume. There is likely also a small hiatal hernia. Imaging of the upper abdomen shows marked distention of some akin small-bowel loops. Due to patient nausea and the marked distension of bowel in the upper abdomen the study was terminated at this time. IMPRESSION: Limited esophageal assessment without signs of gross leak or narrowing. Only a small amount of water-soluble contrast could be swallowed. Ileus versus bowel obstruction incidentally noted in the upper abdomen. This and patient comfort precluded further evaluation. Following resolution of acute symptoms repeat esophageal assessment could be performed. Electronically Signed   By: Zetta Bills M.D.   On: 06/20/2019 12:56   Scheduled Meds:  aspirin EC  81 mg Oral Daily   Chlorhexidine Gluconate Cloth  6  each Topical Daily   feeding supplement (ENSURE ENLIVE)  237 mL Oral BID BM   finasteride  5 mg Oral Daily   insulin aspart  0-9 Units Subcutaneous Q4H   multivitamin with minerals  1 tablet Oral Daily   sodium  chloride flush  3 mL Intravenous Once   tamsulosin  0.4 mg Oral Daily   Continuous Infusions:  sodium chloride 100 mL/hr at 06/20/19 1350   piperacillin-tazobactam (ZOSYN)  IV 2.25 g (06/21/19 0454)   PRN Meds:.alum & mag hydroxide-simeth, ondansetron (ZOFRAN) IV, promethazine **OR** promethazine **OR** promethazine   ASSESMENT:   *   Cholangitis, choledocholithiasis. 06/19/2019 ERCP with sphincterotomy and removal bile duct stone, purulent cholangitis, partial filling of cystic duct.  Nonvisualization of gallbladder raises concern for recurrent cholecystitis. CTAP yesterday show decompressed GB, 10 mm CBD and no CBD stones. day 3 Zosyn. T bili improved.  Alk phos, transaminases remain normal.  WBCs remain moderately elevated.  *    AKI, progressive.  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 Growing Klebsiella from urine, sensitive to Zosyn.  No growth on blood cultures thus far.  *    Lipase elevated to 220 at admission.  Unremarkable pancreas on yesterday's CTAP  *    PSBO versus ileus.   Had bowel movement this morning.  No nausea or vomiting.  *     Ascites, anasarca.   No cirrhosis but liver lesions likely to be cysts seen on CT.  *      Normocytic anemia.  *    Chronic systolic and diastolic CHF.  NSVT.   PLAN   *    Continue with heart healthy diet  *     C-Met in the a.m.    [name]  06/21/2019, 8:43 AM Phone [phone]   ________________________________________________________________________  Velora Heckler GI MD note:  I reviewed the data and agree with the assessment and plan described above.   Owens Loffler, MD [facility] Pager [phone]

## 2019-06-21 NOTE — Progress Notes (Signed)
PROGRESS NOTE    [name]  JKD:[id number] DOB: 1940/09/18 DOA: 06/18/2019 PCP: [name], MD      Brief Narrative:  [name] is a 78 y.o. M with HTN, sCHF EF 25%, CKD III baseline Cr 2 and alcohol use who presented with 4-5 days progressive abdominal pain with vomiting.  In the ER, US abdomen showed gallstones, was admitted for possible cholecystitis.     Assessment & Plan:   Acute choledocholithiasis and cholangitis Afebrile overnight, WBC no change, blood cultures no growth. More icteric today. -Repeat CMP -Continue Zosyn, day 4, continue one more day -Consult GI -Consult Gen Surg   Abdominal distention, mild ileus Esophagram 10/21 showed no esophageal abnormalities, but incidentally did show what appears to be small bowel obstruction or ileus.  Follow up CT showed contrast to the transverse colon, maybe mild ileus.  Today he has diarrhea.  I think maybe he has a mild ileus.  Given he can barely eat, I will hold solid food today, clears only, conitnue IVF monitor with serial exams -Clears only -IV fluids  Acute renal failure on CKD stage III Baseline creatinine is around 2, Cr continuing to trend up. -Continue IV fluids -Hold BP meds, diuretics -Consult Cardiology, appreciate cares -Consult Nephrology, appreciate cares -Check urine lytes, UA -Obtain renal US -Daily BMP -Consult Palliative Care  Chronic systolic CHF -Hold carvedilol given soft BP -Continue aspirin -Hold Entresto, Lasix  Diabetes Glucose controlled -Continue sliding scale corrections  BPH Has indwelling foley -Continue finasteride, Flomax -Consider removing and replacing foley       MDM and disposition: The below labs and imaging reports reviewed and summarized above.  Medication management as above.   This is a severe acute illness with threat to life or bodily function.  He is worsening.  The patient was admitted with abdominal pain, found to have  cholangitis.    He now has worsening renal failure.      DVT prophylaxis: SCDs Code Status: Full code Family Communication:     Consultants:   Cardiology  Gastroenterology  Nephrology  Palliative care   Procedures:   10/20 ERCP -- stones, purulent cholangitis  10/21 CT abdomen -- mild partial ileus  10/22 US renal  Antimicrobials:   Zosyn 10/19 >> 10/22  Culture data:   10/19 urine culture -- Klebsiella, unasyn/zosyn sensistive  10/20 blood culture x2 -- NGTD    Subjective: Still very nauseated.  No appetite.  Abdominal distension no change.  He has lower abdominal pain with moveing, no pain after eating, no RUQ pain, no fever, no confusion.  No leg swelling no orthopnea.     Objective: Vitals:   06/20/19 1422 06/20/19 1821 06/20/19 2005 06/21/19 0432  BP: 95/61 95/65 (!) 88/57 (!) 86/55  Pulse: 79 80 73 67  Resp: 20 17  16   Temp: (!) 97.4 F (36.3 C) 97.9 F (36.6 C) 97.8 F (36.6 C) (!) 97.4 F (36.3 C)  TempSrc: Oral Oral Oral Oral  SpO2: 100% 100% 99% 99%  Weight:      Height:        Intake/Output Summary (Last 24 hours) at 06/21/2019 1050 Last data filed at 06/21/2019 0432 Gross per 24 hour  Intake 120 ml  Output 375 ml  Net -255 ml   Filed Weights   06/19/19 0103 06/19/19 1128  Weight: 90 kg 90 kg    Examination: General appearance:  adult male, alert and in no acute distress.  Appears tired, listless HEENT: Obviously  icteric, conjunctiva pink, lids and lashes normal. No nasal deformity, discharge, epistaxis.  Lips moist, dentition poor, OP moist, no oral lesions, hearing normal   Skin: Warm and dry.  No suspicious rashes or lesions. Cardiac: RRR, no murmurs appreciated.  Brawny change to legs but No LE edema.   JVP normal Respiratory: Normal respiratory rate and rhythm.  CTAB without rales or wheezes. Abdomen: Abdomen soft.  No focal TTP, no guarding.  Still distended, I hear bowel sounds. MSK: No deformities or effusions of  the large joints of the upper or lower extremities bilaterally. Diffuse loss of subcutaneous muscel mass and fat Neuro: Awake and alert. Naming is grossly intact, and the patient's recall, recent and remote, as well as general fund of knowledge seem within normal limits.  Muscle tone normal, without fasciculations.  Moves all extremities equally and with normal coordination.  Marland Kitchen Speech fluent.    Psych: Sensorium intact and responding to questions, attention normal. Affect blunted.  Judgment and insight appear normal.       Data Reviewed: I have personally reviewed following labs and imaging studies:  CBC: Recent Labs  Lab 06/18/19 2054 06/19/19 0819 06/20/19 0616 06/21/19 0138  WBC 9.9 9.2 10.5 10.7*  HGB 12.1* 11.7* 11.8* 10.4*  HCT 36.3* 35.6* 33.7* 31.0*  MCV 88.3 87.5 84.5 85.2  PLT 226 211 184 448   Basic Metabolic Panel: Recent Labs  Lab 06/18/19 2054 06/19/19 0819 06/19/19 1413 06/20/19 0616 06/21/19 0138  NA 133* 136 137 137 137  K 4.1 4.0 4.1 4.2 4.3  CL 98 100 102 100 101  CO2 20* 21* 21* 22 23  GLUCOSE 127* 120* 118* 146* 140*  BUN 69* 73* 77* 90* 102*  CREATININE 3.09* 2.88* 3.26* 3.76* 4.09*  CALCIUM 8.5* 8.5* 8.4* 8.1* 8.0*  MG  --   --  2.1  --   --   PHOS  --   --  5.9*  --   --    GFR: Estimated Creatinine Clearance: 16.6 mL/min (A) (by C-G formula based on SCr of 4.09 mg/dL (H)). Liver Function Tests: Recent Labs  Lab 06/18/19 2054 06/19/19 0819 06/19/19 1413 06/20/19 0616 06/21/19 0138  AST 31 25  --  19 20  ALT 37 30  --  22 19  ALKPHOS 89 70  --  62 47  BILITOT 13.4* 14.8*  --  12.8* 12.9*  PROT 7.7 6.9  --  6.2* 6.0*  ALBUMIN 2.5* 2.2* 2.2* 1.8* 1.8*   Recent Labs  Lab 06/18/19 2054  LIPASE 220*   No results for input(s): AMMONIA in the last 168 hours. Coagulation Profile: Recent Labs  Lab 06/19/19 0036  INR 1.5*   Cardiac Enzymes: No results for input(s): CKTOTAL, CKMB, CKMBINDEX, TROPONINI in the last 168 hours. BNP  (last 3 results) No results for input(s): PROBNP in the last 8760 hours. HbA1C: No results for input(s): HGBA1C in the last 72 hours. CBG: Recent Labs  Lab 06/20/19 1705 06/20/19 2000 06/21/19 0013 06/21/19 0427 06/21/19 0827  GLUCAP 125* 104* 131* 136* 100*   Lipid Profile: No results for input(s): CHOL, HDL, LDLCALC, TRIG, CHOLHDL, LDLDIRECT in the last 72 hours. Thyroid Function Tests: No results for input(s): TSH, T4TOTAL, FREET4, T3FREE, THYROIDAB in the last 72 hours. Anemia Panel: No results for input(s): VITAMINB12, FOLATE, FERRITIN, TIBC, IRON, RETICCTPCT in the last 72 hours. Urine analysis:    Component Value Date/Time   COLORURINE AMBER (A) 06/18/2019 2305   APPEARANCEUR CLOUDY (A) 06/18/2019 2305  LABSPEC 1.017 06/18/2019 2305   PHURINE 5.0 06/18/2019 2305   GLUCOSEU NEGATIVE 06/18/2019 2305   HGBUR MODERATE (A) 06/18/2019 2305   BILIRUBINUR MODERATE (A) 06/18/2019 [place] 06/18/2019 2305   PROTEINUR 100 (A) 06/18/2019 2305   NITRITE NEGATIVE 06/18/2019 2305   LEUKOCYTESUR LARGE (A) 06/18/2019 2305   Sepsis Labs: @LABRCNTIP (procalcitonin:4,lacticacidven:4)  ) Recent Results (from the past 240 hour(s))  Urine culture     Status: Abnormal   Collection Time: 06/18/19 11:05 PM   Specimen: Urine, Random  Result Value Ref Range Status   Specimen Description URINE, RANDOM  Final   Special Requests   Final    NONE Performed at [facility], [place] [address], [place], [place] [address]    Culture >=100,000 COLONIES/mL KLEBSIELLA PNEUMONIAE (A)  Final   Report Status 06/21/2019 FINAL  Final   Organism ID, Bacteria KLEBSIELLA PNEUMONIAE (A)  Final      Susceptibility   Klebsiella pneumoniae - MIC*    AMPICILLIN RESISTANT Resistant     CEFAZOLIN <=4 SENSITIVE Sensitive     CEFTRIAXONE <=1 SENSITIVE Sensitive     CIPROFLOXACIN <=0.25 SENSITIVE Sensitive     GENTAMICIN <=1 SENSITIVE Sensitive     IMIPENEM <=0.25 SENSITIVE Sensitive      NITROFURANTOIN 32 SENSITIVE Sensitive     TRIMETH/SULFA <=20 SENSITIVE Sensitive     AMPICILLIN/SULBACTAM 4 SENSITIVE Sensitive     PIP/TAZO <=4 SENSITIVE Sensitive     Extended ESBL NEGATIVE Sensitive     * >=100,000 COLONIES/mL KLEBSIELLA PNEUMONIAE  SARS Coronavirus 2 by RT PCR (hospital order, performed in [place] hospital lab) Nasopharyngeal Nasopharyngeal Swab     Status: None   Collection Time: 06/18/19 11:25 PM   Specimen: Nasopharyngeal Swab  Result Value Ref Range Status   SARS Coronavirus 2 NEGATIVE NEGATIVE Final    Comment: (NOTE) If result is NEGATIVE SARS-CoV-2 target nucleic acids are NOT DETECTED. The SARS-CoV-2 RNA is generally detectable in upper and lower  respiratory specimens during the acute phase of infection. The lowest  concentration of SARS-CoV-2 viral copies this assay can detect is 250  copies / mL. A negative result does not preclude SARS-CoV-2 infection  and should not be used as the sole basis for treatment or other  patient management decisions.  A negative result may occur with  improper specimen collection / handling, submission of specimen other  than nasopharyngeal swab, presence of viral mutation(s) within the  areas targeted by this assay, and inadequate number of viral copies  (<250 copies / mL). A negative result must be combined with clinical  observations, patient history, and epidemiological information. If result is POSITIVE SARS-CoV-2 target nucleic acids are DETECTED. The SARS-CoV-2 RNA is generally detectable in upper and lower  respiratory specimens dur ing the acute phase of infection.  Positive  results are indicative of active infection with SARS-CoV-2.  Clinical  correlation with patient history and other diagnostic information is  necessary to determine patient infection status.  Positive results do  not rule out bacterial infection or co-infection with other viruses. If result is PRESUMPTIVE POSTIVE SARS-CoV-2 nucleic acids  MAY BE PRESENT.   A presumptive positive result was obtained on the submitted specimen  and confirmed on repeat testing.  While 2019 novel coronavirus  (SARS-CoV-2) nucleic acids may be present in the submitted sample  additional confirmatory testing may be necessary for epidemiological  and / or clinical management purposes  to differentiate between  SARS-CoV-2 and other Sarbecovirus  currently known to infect humans.  If clinically indicated additional testing with an alternate test  methodology [phone]) is advised. The SARS-CoV-2 RNA is generally  detectable in upper and lower respiratory sp ecimens during the acute  phase of infection. The expected result is Negative. Fact Sheet for Patients:  StrictlyIdeas.no Fact Sheet for Healthcare Providers: BankingDealers.co.za This test is not yet approved or cleared by the [place] FDA and has been authorized for detection and/or diagnosis of SARS-CoV-2 by FDA under an Emergency Use Authorization (EUA).  This EUA will remain in effect (meaning this test can be used) for the duration of the COVID-19 declaration under Section 564(b)(1) of the Act, 21 U.S.C. section 360bbb-3(b)(1), unless the authorization is terminated or revoked sooner. Performed at [facility], [place] [address], [place], [place] [address]   Culture, blood (routine x 2)     Status: None (Preliminary result)   Collection Time: 06/19/19  1:17 AM   Specimen: BLOOD LEFT HAND  Result Value Ref Range Status   Specimen Description BLOOD LEFT HAND  Final   Special Requests   Final    BOTTLES DRAWN AEROBIC AND ANAEROBIC Blood Culture results may not be optimal due to an inadequate volume of blood received in culture bottles   Culture   Final    NO GROWTH 2 DAYS Performed at [facility], [place] [address] [place], [place] [address]    Report Status PENDING  Incomplete  Culture, blood (routine x 2)     Status: None  (Preliminary result)   Collection Time: 06/19/19  1:30 AM   Specimen: BLOOD RIGHT HAND  Result Value Ref Range Status   Specimen Description BLOOD RIGHT HAND  Final   Special Requests   Final    BOTTLES DRAWN AEROBIC AND ANAEROBIC Blood Culture results may not be optimal due to an inadequate volume of blood received in culture bottles   Culture   Final    NO GROWTH 2 DAYS Performed at [facility], [place] [address], [place], [place] [address]    Report Status PENDING  Incomplete         Radiology Studies: Ct Abdomen Pelvis Wo Contrast  Result Date: 06/20/2019 CLINICAL DATA:  Concern for high-grade small bowel obstruction EXAM: CT ABDOMEN AND PELVIS WITHOUT CONTRAST TECHNIQUE: Multidetector CT imaging of the abdomen and pelvis was performed following the standard protocol without IV contrast. COMPARISON:  CT abdomen pelvis dated 06/19/2019 FINDINGS: Lower chest: Moderate bilateral pleural effusions with associated atelectasis are partially imaged. The heart is enlarged. Hepatobiliary: Multiple small hypoattenuating lesions in the liver are not significantly changed since 11/23/2017. And likely represent cysts. The gallbladder is decompressed. The common bile duct measures up to 10 mm in diameter. No distal common bile duct stone is identified on today's exam. Pancreas: Unremarkable. No pancreatic ductal dilatation or surrounding inflammatory changes. Spleen: Normal in size without focal abnormality. Adrenals/Urinary Tract: Adrenal glands are unremarkable. A 4 mm area of hyperdensity in the right kidney may represent a renal stone. Otherwise, the kidneys are normal, without renal calculi, focal lesion, or hydronephrosis. Bladder is decompressed with a Foley catheter. Stomach/Bowel: Stomach is within normal limits. Enteric contrast reaches the transverse colon. There is colonic diverticulosis. Appendix appears normal. Multiple borderline dilated loops of small bowel are seen throughout the mid  abdomen. No discrete transition point is identified. Vascular/Lymphatic: Aortic atherosclerosis. No enlarged abdominal or pelvic lymph nodes. Reproductive: Prostate is unremarkable. Other: Abdominal ascites and anasarca are redemonstrated.  No abdominal wall hernia is identified. 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 Degenerative changes are seen in the lumbar spine. IMPRESSION: 1. Multiple borderline dilated loops of small bowel throughout the mid abdomen. No discrete transition point is identified. Findings may represent ileus or partial obstruction given that oral contrast reaches the transverse colon. 2. Moderate bilateral pleural effusions with associated atelectasis. 3. Abdominal ascites and anasarca. Aortic Atherosclerosis (ICD10-I70.0). Electronically Signed   By: Zerita Boers M.D.   On: 06/20/2019 21:34   Dg Ercp Biliary & Pancreatic Ducts  Result Date: 06/19/2019 CLINICAL DATA:  ERCP.  Choledocholithiasis. EXAM: ERCP TECHNIQUE: Multiple spot images obtained with the fluoroscopic device and submitted for interpretation post-procedure. COMPARISON:  CT abdomen pelvis-06/19/2019; image guided cholecystostomy tube placement-11/25/2017; cholangiogram via existing cholecystostomy tube-11/30/2017 FLUOROSCOPY TIME:  2 minutes, 34 seconds FINDINGS: Two spot intraoperative fluoroscopic images during ERCP are provided for review Initial image demonstrates an ERCP probe overlying the right upper abdominal quadrant. There is selective cannulation and opacification of common bile duct which appears moderately dilated. There are several ill-defined filling defects seen within the CBD worrisome for choledocholithiasis. Completion image demonstrates apparent interval stone extraction with persistent dilatation of the CBD. There is faint opacification of the cystic duct and gallbladder lumen. No definitive opacification of the pancreatic duct.  IMPRESSION: ERCP with findings of choledocholithiasis. These images were submitted for radiologic interpretation only. Please see the procedural report for the amount of contrast and the fluoroscopy time utilized. Electronically Signed   By: Sandi Mariscal M.D.   On: 06/19/2019 13:31   Dg Esophagus W Single Cm (sol Or Thin Ba)  Result Date: 06/20/2019 CLINICAL DATA:  Post ERCP, history of globus sensation. EXAM: ESOPHOGRAM/BARIUM SWALLOW TECHNIQUE: Single contrast examination was performed using water-soluble contrast. FLUOROSCOPY TIME:  Fluoroscopy Time:  1 minutes 18 seconds Radiation Exposure Index (if provided by the fluoroscopic device): 7 mGy Number of Acquired Spot Images: 0 COMPARISON:  None. FINDINGS: Only a small amount of water-soluble contrast could be swallowed due to patient comfort. Limited assessment shows no signs of esophageal obstruction or leak. Luminal characteristics are difficult to evaluate given small volume. There is likely also a small hiatal hernia. Imaging of the upper abdomen shows marked distention of some akin small-bowel loops. Due to patient nausea and the marked distension of bowel in the upper abdomen the study was terminated at this time. IMPRESSION: Limited esophageal assessment without signs of gross leak or narrowing. Only a small amount of water-soluble contrast could be swallowed. Ileus versus bowel obstruction incidentally noted in the upper abdomen. This and patient comfort precluded further evaluation. Following resolution of acute symptoms repeat esophageal assessment could be performed. Electronically Signed   By: Zetta Bills M.D.   On: 06/20/2019 12:56        Scheduled Meds:  aspirin EC  81 mg Oral Daily   Chlorhexidine Gluconate Cloth  6 each Topical Daily   feeding supplement (ENSURE ENLIVE)  237 mL Oral BID BM   finasteride  5 mg Oral Daily   insulin aspart  0-9 Units Subcutaneous Q4H   multivitamin with minerals  1 tablet Oral Daily    sodium chloride flush  3 mL Intravenous Once   tamsulosin  0.4 mg Oral Daily   Continuous Infusions:  sodium chloride 100 mL/hr at 06/20/19 1350   piperacillin-tazobactam (ZOSYN)  IV 2.25 g (06/21/19 0454)     LOS: 2 days    Time spent: 35  minutes    Edwin Dada, MD [facility] 06/21/2019, 10:50 AM     Please page through AMION:  [email or web address] Password [email or web address] If 7PM-7AM, please contact night-coverage

## 2019-06-21 NOTE — Plan of Care (Signed)
  Problem: Health Behavior/Discharge Planning: Goal: Ability to manage health-related needs will improve Outcome: Progressing   Problem: Clinical Measurements: Goal: Ability to maintain clinical measurements within normal limits will improve Outcome: Progressing Goal: Diagnostic test results will improve Outcome: Progressing   Problem: Coping: Goal: Level of anxiety will decrease Outcome: Progressing   

## 2019-06-21 NOTE — Progress Notes (Signed)
  Speech Language Pathology Treatment: Dysphagia  Patient Details Name: [name] MRN: [id number] DOB: 1940-08-31 Today's Date: 06/21/2019 Time: 1010-1018 SLP Time Calculation (min) (ACUTE ONLY): 8 min  Assessment / Plan / Recommendation Clinical Impression  Pt seen briefly for follow up after Barium Swallow completed yesterday. Pt continues to report globus sensation with po intake, but does not indicate s/s aspiration that would indicate pharyngeal issues. Chart review indicates pt has had globus sensation since March 2020. Pt is afebrile. Moderate bilateral pleural effusions per Abdominal CT. 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 No further ST intervention recommended at this time. Please reconsult if needs arise.    HPI HPI: 78yo male admitted 06/18/2019 with RUQ abdominal pain. Pt underwent ERCP 10/20 for sphincterotomy and removal of stones.  PMH: HTM HLD, DM2, CKD3, PVD, chronic CHF, etoh, cholecystectomy (10/2018)      SLP Plan  Discharge SLP treatment due to (comment)       Recommendations  Diet recommendations: Regular;Thin liquid Liquids provided via: Cup;Straw Medication Administration: Whole meds with liquid Supervision: Patient able to self feed Compensations: Slow rate;Small sips/bites;Follow solids with liquid Postural Changes and/or Swallow Maneuvers: Seated upright 90 degrees;Upright 30-60 min after meal                Oral Care Recommendations: Oral care BID Follow up Recommendations: None SLP Visit Diagnosis: Dysphagia, unspecified (R13.10) Plan: Discharge SLP treatment due to (comment)       GO          [name], [facility], [place] Speech Language Pathologist Office: [phone] Pager: [phone]  Shonna Chock 06/21/2019, 10:20 AM

## 2019-06-22 ENCOUNTER — Inpatient Hospital Stay: Payer: Self-pay

## 2019-06-22 ENCOUNTER — Other Ambulatory Visit: Payer: Self-pay

## 2019-06-22 ENCOUNTER — Inpatient Hospital Stay (HOSPITAL_COMMUNITY): Payer: Medicare Other

## 2019-06-22 DIAGNOSIS — R570 Cardiogenic shock: Secondary | ICD-10-CM | POA: Diagnosis not present

## 2019-06-22 DIAGNOSIS — I361 Nonrheumatic tricuspid (valve) insufficiency: Secondary | ICD-10-CM | POA: Diagnosis not present

## 2019-06-22 DIAGNOSIS — I5043 Acute on chronic combined systolic (congestive) and diastolic (congestive) heart failure: Secondary | ICD-10-CM | POA: Diagnosis not present

## 2019-06-22 DIAGNOSIS — K8050 Calculus of bile duct without cholangitis or cholecystitis without obstruction: Secondary | ICD-10-CM | POA: Diagnosis not present

## 2019-06-22 DIAGNOSIS — I340 Nonrheumatic mitral (valve) insufficiency: Secondary | ICD-10-CM

## 2019-06-22 DIAGNOSIS — N183 Chronic kidney disease, stage 3 unspecified: Secondary | ICD-10-CM

## 2019-06-22 DIAGNOSIS — N179 Acute kidney failure, unspecified: Secondary | ICD-10-CM | POA: Diagnosis not present

## 2019-06-22 DIAGNOSIS — K8031 Calculus of bile duct with cholangitis, unspecified, with obstruction: Secondary | ICD-10-CM | POA: Diagnosis not present

## 2019-06-22 LAB — CBC
HCT: 28.3 % — ABNORMAL LOW (ref 39.0–52.0)
Hemoglobin: 10 g/dL — ABNORMAL LOW (ref 13.0–17.0)
MCH: 29.3 pg (ref 26.0–34.0)
MCHC: 35.3 g/dL (ref 30.0–36.0)
MCV: 83 fL (ref 80.0–100.0)
Platelets: 145 10*3/uL — ABNORMAL LOW (ref 150–400)
RBC: 3.41 MIL/uL — ABNORMAL LOW (ref 4.22–5.81)
RDW: 16.9 % — ABNORMAL HIGH (ref 11.5–15.5)
WBC: 6.4 10*3/uL (ref 4.0–10.5)
nRBC: 0.5 % — ABNORMAL HIGH (ref 0.0–0.2)

## 2019-06-22 LAB — COOXEMETRY PANEL
Carboxyhemoglobin: 2.2 % — ABNORMAL HIGH (ref 0.5–1.5)
Methemoglobin: 1.1 % (ref 0.0–1.5)
O2 Saturation: 56.4 %
Total hemoglobin: 8.3 g/dL — ABNORMAL LOW (ref 12.0–16.0)

## 2019-06-22 LAB — COMPREHENSIVE METABOLIC PANEL
ALT: 20 U/L (ref 0–44)
AST: 18 U/L (ref 15–41)
Albumin: 1.8 g/dL — ABNORMAL LOW (ref 3.5–5.0)
Alkaline Phosphatase: 39 U/L (ref 38–126)
Anion gap: 11 (ref 5–15)
BUN: 113 mg/dL — ABNORMAL HIGH (ref 8–23)
CO2: 23 mmol/L (ref 22–32)
Calcium: 8 mg/dL — ABNORMAL LOW (ref 8.9–10.3)
Chloride: 103 mmol/L (ref 98–111)
Creatinine, Ser: 4.66 mg/dL — ABNORMAL HIGH (ref 0.61–1.24)
GFR calc Af Amer: 13 mL/min — ABNORMAL LOW (ref >60)
GFR calc non Af Amer: 11 mL/min — ABNORMAL LOW (ref >60)
Glucose, Bld: 112 mg/dL — ABNORMAL HIGH (ref 70–99)
Potassium: 4 mmol/L (ref 3.5–5.1)
Sodium: 137 mmol/L (ref 135–145)
Total Bilirubin: 10.6 mg/dL — ABNORMAL HIGH (ref 0.3–1.2)
Total Protein: 6.3 g/dL — ABNORMAL LOW (ref 6.5–8.1)

## 2019-06-22 LAB — GLUCOSE, CAPILLARY
Glucose-Capillary: 104 mg/dL — ABNORMAL HIGH (ref 70–99)
Glucose-Capillary: 104 mg/dL — ABNORMAL HIGH (ref 70–99)
Glucose-Capillary: 111 mg/dL — ABNORMAL HIGH (ref 70–99)
Glucose-Capillary: 113 mg/dL — ABNORMAL HIGH (ref 70–99)
Glucose-Capillary: 153 mg/dL — ABNORMAL HIGH (ref 70–99)
Glucose-Capillary: 165 mg/dL — ABNORMAL HIGH (ref 70–99)
Glucose-Capillary: 93 mg/dL (ref 70–99)

## 2019-06-22 LAB — URINALYSIS, ROUTINE W REFLEX MICROSCOPIC
Glucose, UA: NEGATIVE mg/dL
Ketones, ur: NEGATIVE mg/dL
Nitrite: NEGATIVE
Protein, ur: 30 mg/dL — AB
Specific Gravity, Urine: 1.02 (ref 1.005–1.030)
pH: 5 (ref 5.0–8.0)

## 2019-06-22 LAB — PROTEIN / CREATININE RATIO, URINE
Creatinine, Urine: 167.23 mg/dL
Protein Creatinine Ratio: 0.57 mg/mg{Cre} — ABNORMAL HIGH (ref 0.00–0.15)
Total Protein, Urine: 95 mg/dL

## 2019-06-22 LAB — ECHOCARDIOGRAM COMPLETE
Height: 71 in
Weight: 3174.62 oz

## 2019-06-22 LAB — SODIUM, URINE, RANDOM: Sodium, Ur: 32 mmol/L

## 2019-06-22 LAB — HEPARIN LEVEL (UNFRACTIONATED): Heparin Unfractionated: <0.1 IU/mL — ABNORMAL LOW (ref 0.30–0.70)

## 2019-06-22 LAB — LACTIC ACID, PLASMA
Lactic Acid, Venous: 1.1 mmol/L (ref 0.5–1.9)
Lactic Acid, Venous: 0.9 mmol/L (ref 0.5–1.9)

## 2019-06-22 LAB — CREATININE, URINE, RANDOM: Creatinine, Urine: 165.97 mg/dL

## 2019-06-22 MED ORDER — HEPARIN (PORCINE) 25000 UT/250ML-% IV SOLN
1700.0000 [IU]/h | INTRAVENOUS | Status: DC
  Administered 2019-06-22: 1100 [IU]/h via INTRAVENOUS
  Administered 2019-06-23: 1400 [IU]/h via INTRAVENOUS
  Administered 2019-06-23 – 2019-06-30 (×9): 1700 [IU]/h via INTRAVENOUS
  Filled 2019-06-22 (×13): qty 250

## 2019-06-22 MED ORDER — SACCHAROMYCES BOULARDII 250 MG PO CAPS
250.0000 mg | ORAL_CAPSULE | Freq: Two times a day (BID) | ORAL | Status: DC
  Administered 2019-06-22 – 2019-07-03 (×23): 250 mg via ORAL
  Filled 2019-06-22 (×23): qty 1

## 2019-06-22 MED ORDER — DOPAMINE-DEXTROSE 3.2-5 MG/ML-% IV SOLN
5.0000 ug/kg/min | INTRAVENOUS | Status: DC
  Filled 2019-06-22: qty 250

## 2019-06-22 MED ORDER — HEPARIN BOLUS VIA INFUSION
1500.0000 [IU] | Freq: Once | INTRAVENOUS | Status: AC
  Administered 2019-06-22: 1500 [IU] via INTRAVENOUS
  Filled 2019-06-22: qty 1500

## 2019-06-22 MED ORDER — SODIUM CHLORIDE 0.9% FLUSH
10.0000 mL | Freq: Two times a day (BID) | INTRAVENOUS | Status: DC
  Administered 2019-06-22: 20 mL
  Administered 2019-06-23 – 2019-06-26 (×3): 10 mL
  Administered 2019-06-26: 20 mL
  Administered 2019-06-27 – 2019-06-29 (×6): 10 mL
  Administered 2019-06-30: 20 mL
  Administered 2019-06-30 – 2019-07-04 (×8): 10 mL
  Administered 2019-07-05: 20 mL
  Administered 2019-07-05 – 2019-07-08 (×5): 10 mL

## 2019-06-22 MED ORDER — DOBUTAMINE IN D5W 4-5 MG/ML-% IV SOLN
1.0000 ug/kg/min | INTRAVENOUS | Status: DC
  Administered 2019-06-22: 3 ug/kg/min via INTRAVENOUS
  Administered 2019-06-24: 5 ug/kg/min via INTRAVENOUS
  Administered 2019-06-26: 2.5 ug/kg/min via INTRAVENOUS
  Filled 2019-06-22 (×3): qty 250

## 2019-06-22 MED ORDER — HEPARIN BOLUS VIA INFUSION
3000.0000 [IU] | Freq: Once | INTRAVENOUS | Status: AC
  Administered 2019-06-22: 3000 [IU] via INTRAVENOUS
  Filled 2019-06-22: qty 3000

## 2019-06-22 MED ORDER — SODIUM CHLORIDE 0.9% FLUSH: 10.0000 mL | INTRAVENOUS | Status: DC | PRN

## 2019-06-22 MED ORDER — FUROSEMIDE 10 MG/ML IJ SOLN
80.0000 mg | Freq: Two times a day (BID) | INTRAMUSCULAR | Status: DC
  Administered 2019-06-22 – 2019-06-23 (×2): 80 mg via INTRAVENOUS
  Filled 2019-06-22 (×2): qty 8

## 2019-06-22 MED ORDER — AMOXICILLIN-POT CLAVULANATE 500-125 MG PO TABS
1.0000 | ORAL_TABLET | Freq: Two times a day (BID) | ORAL | Status: AC
  Administered 2019-06-22 – 2019-06-26 (×10): 500 mg via ORAL
  Filled 2019-06-22 (×11): qty 1

## 2019-06-22 NOTE — Progress Notes (Signed)
ANTICOAGULATION CONSULT NOTE - Initial Consult  Pharmacy Consult for Heparin Indication: LV thrombus  No Known Allergies  Patient Measurements: Height: 5\' 11"  (180.3 cm) Weight: 198 lb 6.6 oz (90 kg) IBW/kg (Calculated) : 75.3  Vital Signs: Temp: 97.4 F (36.3 C) (10/23 0451) Temp Source: Oral (10/23 0451) BP: 91/56 (10/23 0451) Pulse Rate: 67 (10/23 0451)  Labs: Recent Labs    06/20/19 0616 06/21/19 0138 06/22/19 0401  HGB 11.8* 10.4* 10.0*  HCT 33.7* 31.0* 28.3*  PLT 184 164 145*  CREATININE 3.76* 4.09* 4.66*    Estimated Creatinine Clearance: 14.6 mL/min (A) (by C-G formula based on SCr of 4.66 mg/dL (H)).  Assessment: 78 year old male with CHF (EF 10-15%), acute renal failure, and acute cholangitis to begin heparin for new LV thrombus S/p ERCP  Goal of Therapy:  Heparin level 0.3-0.7 units/ml Monitor platelets by anticoagulation protocol: Yes   Plan:  Small heparin bolus 3000 units iv x 1 Heparin drip at 1100 units / hr Heparin level 8 hours after heparin begins Daily heparin level, CBC  Thank you Anette Guarneri, PharmD  06/22/2019,11:01 AM

## 2019-06-22 NOTE — Progress Notes (Signed)
  Echocardiogram 2D Echocardiogram has been performed.  Jennette Dubin 06/22/2019, 9:34 AM

## 2019-06-22 NOTE — Consult Note (Addendum)
Advanced Heart Failure Team Consult Note   Primary Physician: [name], MD PCP-Cardiologist:  Candee Furbish, MD  Reason for Consultation: Heart Failure   HPI:    [name] is seen today for evaluation of heart failure  at the request of Dr Oval Linsey.   [name] is a 78 year old with a history of combined diastolic/systolic heart failure, diabetes, CKD Stage III, ETOH abuse, HTN, BPH, urinary retention (foley changed monthly),  and hyperlipidemia. Poor historian.   He was seen by [facility] in June 2016 where at the time he was visiting from [place].  During this admission ECHO showed EF of 25 to 30% with diffuse hypokinesis, grade 1 diastolic dysfunction, mild mitral regurgitation, moderately dilated left atrium and normal RV. Cath offered but he wanted to go back to [place] and complete this work up. Was seen again in March 2019 when he presented with 2 days of severe abdominal pain.  Imaging showed gallstones with acute cholecystitis with concern for pyelonephritis.  Notes indicate during this admission he had not been taking any of his cardiac medications with the exception of aspirin.  He had not undergone further ischemic work-up when traveling back to [place] and was treated medically.  Cardiology was called for preoperative evaluation.  It was felt at that time he would be very high risk from a cardiovascular standpoint.  He underwent PERC drain placement.  Admitted in June 6546 with A/C systolic heart failure. Creatinine peaked at 2.8 but was 2.1 at the time of discharge.  Diuresed with IV lasix and transitioned to lasix 120 mg daily.   Over the last few weeks he has noticed increased SOB and leg edema extending to his scrotum. Says he was taking every day. He has been living alone and able to go to the store. Denies chest pain.   Presented with abdominal pain and shortness of breath.  Admitted 10/20 with choledocholithiasis and  cholangitis.  Surgery was consulted and felt given no concern for acute cholecystitis they would not recommend lap chol but may need another perc drain. GI consulted and he underwent ERCP 10/20 with cholangitis, recommendation for IV antibiotics. Pertinent admission labs included: BNP 2536, Hgb 12.1, total bili 13.4 creatinine 2.8, and negative for COVID. CXR on admit showed vascular congestion.  He had an ECHO that showed severely reduced EF and LV thrombus.   Cardiology consulted.BP has been low so carvedilol was held. Received a dose of lasix but later stopped on 10/21. Due to worsening renal function he was given IV fluids. IV fluids stopped 06/21/19.  Renal US completed and was negative for hydronephrosis. HF team consulted today due to concern for cardiogenic shock.   He is SOB with exertion.   Cardiac Testing ECHO EF 20200  EF 10-15%, fixed thrombus on apical wall of left ventricle, severely reduced RV, RA and LA severely dilated.  Echo 2019 EF 20-25% RV mildly dilated  Review of Systems: [y] = yes, [ ]  = no    General: Weight gain [ ] ; Weight loss [ ] ; Anorexia [ ] ; Fatigue [Y ]; Fever [ ] ; Chills [ ] ; Weakness [Y ]   Cardiac: Chest pain/pressure [ ] ; Resting SOB [ ] ; Exertional SOB [Y ]; Orthopnea [ Y]; Pedal Edema [ Y]; Palpitations [ ] ; Syncope [ ] ; Presyncope [ ] ; Paroxysmal nocturnal dyspnea[ ]    Pulmonary: Cough [ ] ; Wheezing[ ] ; Hemoptysis[ ] ; Sputum [ ] ; Snoring [ ]    GI: Vomiting[ ] ;  Dysphagia[ ] ; Melena[ ] ; Hematochezia [ ] ; Heartburn[ ] ; Abdominal pain [Y ]; Constipation [ ] ; Diarrhea [Y ]; BRBPR [ ]    GU: Hematuria[ ] ; Dysuria [ ] ; Nocturia[ ]    Vascular: Pain in legs with walking [ ] ; Pain in feet with lying flat [ ] ; Non-healing sores [ ] ; Stroke [ ] ; TIA [ ] ; Slurred speech [ ] ;   Neuro: Headaches[ ] ; Vertigo[ ] ; Seizures[ ] ; Paresthesias[ ] ;Blurred vision [ ] ; Diplopia [ ] ; Vision changes [ ]    Ortho/Skin: Arthritis [ ] ; Joint pain Y[ ] ; Muscle pain [ ] ; Joint swelling [ ] ; Back  Pain [Y ]; Rash [ ]    Psych: Depression[ ] ; Anxiety[ ]    Heme: Bleeding problems [ ] ; Clotting disorders [ ] ; Anemia [ Y]   Endocrine: Diabetes [ ] ; Thyroid dysfunction[ ]   Home Medications Prior to Admission medications   Medication Sig Start Date End Date Taking? Authorizing Provider  carvedilol (COREG) 12.5 MG tablet Take 1 tablet (12.5 mg total) by mouth 2 (two) times daily. Patient taking differently: Take 6.25 mg by mouth 2 (two) times daily.  03/08/18 06/19/19 Yes Daune Perch, NP  finasteride (PROSCAR) 5 MG tablet Take 1 tablet (5 mg total) by mouth daily. 09/03/18  Yes Fawze, Mina A, PA-C  furosemide (LASIX) 40 MG tablet Take 3 tablets (120 mg total) by mouth daily. Patient taking differently: Take 40 mg by mouth daily.  02/12/19  Yes [name], MD    Past Medical History: Past Medical History:  Diagnosis Date   Acute cholecystitis 11/23/2017   Acute respiratory failure with hypoxia (HCC)    Acute upper back pain    AKI (acute kidney injury) ([place])    Alcohol abuse 02/26/2015   Anemia, deficiency    Back pain    LUMBOSACRAL   Bronchitis, complicated    Cardiomegaly    Chronic combined systolic and diastolic CHF (congestive heart failure) (HCC)    Chronic pain    HIGH RISK MED USE COMPREHENSIVE HIGH RISK   CKD (chronic kidney disease), stage III    Demand ischemia ([place])    a. minimally elevated troponin peak 0.11 in 2016, felt demand ischemia. Cath offered but pt wished to have done back in [place].   Diabetes mellitus without complication (HCC)    Edema, leg    Elevated troponin 02/26/2015   Essential hypertension    Fatigue    Heart abnormality    Hyperlipidemia    Hypertension    Neck pain, acute    PVD (peripheral vascular disease) ([place])    Renal lesion 11/23/2017   SOB (shortness of breath) on exertion    Solitary kidney    Vitamin D deficiency     Past Surgical History: Past Surgical History:  Procedure Laterality Date    ENDOSCOPIC RETROGRADE CHOLANGIOPANCREATOGRAPHY (ERCP) WITH PROPOFOL N/A 06/19/2019   Procedure: ENDOSCOPIC RETROGRADE CHOLANGIOPANCREATOGRAPHY (ERCP) WITH PROPOFOL;  Surgeon: Milus Banister, MD;  Location[facility] END[facility]COPY;  Service: Endoscopy;  Laterality: N/A;   IR CHOLANGIOGRAM EXISTING TUBE  12/27/2017   IR PERC CHOLECYSTOSTOMY  11/25/2017   IR RADIOLOGIST EVAL & MGMT  02/02/2018   REMOVAL OF STONES  06/19/2019   Procedure: REMOVAL OF STONES;  Surgeon: Milus Banister, MD;  Location: [facility] ENDOSCOPY;  Service: Endoscopy;;   SPHINCTEROTOMY  06/19/2019   Procedure: Joan Mayans;  Surgeon: Milus Banister, MD;  Location: [facility] ENDOSCOPY;  Service: Endoscopy;;    Family History: Family History  Problem Relation Age of Onset   Other  Mother        died in her sleep at 59   Hypertension Sister    Hyperlipidemia Sister    Diabetes Mellitus II Sister    Heart disease Sister    CAD Neg Hx        neg hx premature CAD    Social History: Social History   Socioeconomic History   Marital status: Single    Spouse name: Not on file   Number of children: Not on file   Years of education: Not on file   Highest education level: Not on file  Occupational History   Not on file  Social Needs   Financial resource strain: Not on file   Food insecurity    Worry: Not on file    Inability: Not on file   Transportation needs    Medical: Not on file    Non-medical: Not on file  Tobacco Use   Smoking status: Former Smoker    Types: Cigarettes    Quit date: 02/26/2008    Years since quitting: 11.3   Smokeless tobacco: Never Used  Substance and Sexual Activity   Alcohol use: Yes    Comment: occ   Drug use: No   Sexual activity: Not on file  Lifestyle   Physical activity    Days per week: Not on file    Minutes per session: Not on file   Stress: Not on file  Relationships   Social connections    Talks on phone: Not on file    Gets together: Not on file    Attends  religious service: Not on file    Active member of club or organization: Not on file    Attends meetings of clubs or organizations: Not on file    Relationship status: Not on file  Other Topics Concern   Not on file  Social History Narrative   Not on file    Allergies:  No Known Allergies  Objective:    Vital Signs:   Temp:  [97.4 F (36.3 C)-97.6 F (36.4 C)] 97.4 F (36.3 C) (10/23 0451) Pulse Rate:  [67] 67 (10/23 0451) Resp:  [18] 18 (10/23 0451) BP: (86-91)/(56-60) 91/56 (10/23 0451) SpO2:  [99 %-100 %] 99 % (10/23 0451) Last BM Date: 06/22/19  Weight change: Filed Weights   06/19/19 0103 06/19/19 1128  Weight: 90 kg 90 kg    Intake/Output:   Intake/Output Summary (Last 24 hours) at 06/22/2019 1213 Last data filed at 06/22/2019 0400 Gross per 24 hour  Intake 498 ml  Output 325 ml  Net 173 ml      Physical Exam    General:  Sitting in the chair No resp difficulty HEENT:Scleral icterus  Neck: supple. JVP to jaw . Carotids 2+ bilat; no bruits. No lymphadenopathy or thyromegaly appreciated. Cor: PMI nondisplaced. Regular rate & rhythm. No rubs, gallops or murmurs. Lungs: clear Abdomen: soft, nontender, nondistended. No hepatosplenomegaly. No bruits or masses. Good bowel sounds. Extremities: no cyanosis, clubbing, rash, R and LLE 2-3+  Neuro: alert & orientedx3, cranial nerves grossly intact. moves all 4 extremities w/o difficulty. Affect pleasant GU: foley.    Telemetry  NSR with PVCs NSVT  EKG       Labs   Basic Metabolic Panel: Recent Labs  Lab 06/19/19 0819 06/19/19 1413 06/20/19 0616 06/21/19 0138 06/22/19 0401  NA 136 137 137 137 137  K 4.0 4.1 4.2 4.3 4.0  CL 100 102 100 101 103  CO2 21* 21*  22 23 23   GLUCOSE 120* 118* 146* 140* 112*  BUN 73* 77* 90* 102* 113*  CREATININE 2.88* 3.26* 3.76* 4.09* 4.66*  CALCIUM 8.5* 8.4* 8.1* 8.0* 8.0*  MG  --  2.1  --   --   --   PHOS  --  5.9*  --   --   --     Liver Function  Tests: Recent Labs  Lab 06/18/19 2054 06/19/19 0819 06/19/19 1413 06/20/19 0616 06/21/19 0138 06/22/19 0401  AST 31 25  --  19 20 18   ALT 37 30  --  22 19 20   ALKPHOS 89 70  --  62 47 39  BILITOT 13.4* 14.8*  --  12.8* 12.9* 10.6*  PROT 7.7 6.9  --  6.2* 6.0* 6.3*  ALBUMIN 2.5* 2.2* 2.2* 1.8* 1.8* 1.8*   Recent Labs  Lab 06/18/19 2054  LIPASE 220*   No results for input(s): AMMONIA in the last 168 hours.  CBC: Recent Labs  Lab 06/18/19 2054 06/19/19 0819 06/20/19 0616 06/21/19 0138 06/22/19 0401  WBC 9.9 9.2 10.5 10.7* 6.4  HGB 12.1* 11.7* 11.8* 10.4* 10.0*  HCT 36.3* 35.6* 33.7* 31.0* 28.3*  MCV 88.3 87.5 84.5 85.2 83.0  PLT 226 211 184 164 145*    Cardiac Enzymes: No results for input(s): CKTOTAL, CKMB, CKMBINDEX, TROPONINI in the last 168 hours.  BNP: BNP (last 3 results) Recent Labs    08/16/18 0940 02/04/19 0906 06/18/19 2109  BNP 1,774.3* >4,500.0* 2,536.0*    ProBNP (last 3 results) No results for input(s): PROBNP in the last 8760 hours.   CBG: Recent Labs  Lab 06/21/19 1958 06/22/19 0033 06/22/19 0358 06/22/19 0743 06/22/19 1200  GLUCAP 101* 93 104* 104* 111*    Coagulation Studies: No results for input(s): LABPROT, INR in the last 72 hours.   Imaging   [place] Ekg Site Rite  Result Date: 06/22/2019 If Site Rite image not attached, placement could not be confirmed due to current cardiac rhythm.     Medications:     Current Medications:  amoxicillin-clavulanate  1 tablet Oral Q12H   aspirin EC  81 mg Oral Daily   Chlorhexidine Gluconate Cloth  6 each Topical Daily   feeding supplement (ENSURE ENLIVE)  237 mL Oral BID BM   finasteride  5 mg Oral Daily   insulin aspart  0-9 Units Subcutaneous Q4H   multivitamin with minerals  1 tablet Oral Daily   saccharomyces boulardii  250 mg Oral BID   sodium chloride flush  3 mL Intravenous Once   tamsulosin  0.4 mg Oral Daily     Infusions:  DOBUTamine     heparin  1,100 Units/hr (06/22/19 1140)        Assessment/Plan   1. Shock  Suspect cardiogenic shock versus septic shock  - SBP in 80-90s.  -WBC 6.4  - Check lactic acid now.  - Anticipate adding dobutamine. Will need central access. - PICC can be placed given he is not a hemodialysis candidate.   2. A/C Biventricular Combined Diastolic/Systolic Heart Failure  ECHO this admit with EF 10-15%. He has never had heart cath. Unable to obtain due to elevated creatinine.   - Marked volume overload. Has not had po lasix since 10/21 due to elevated creatinine. Give 80 mg IV twice daily with first dose now.  No bb with hypotension No arb/spiro with worsening renal function.   3. AKI on CKD Stage III In June 2020  Creatinine was 2.1  Creatinine  trending up--->  2.8>3.26>3.7>4.1>4.66 Suspect related to low output heart failure.  Renal US- no hydronephrosis.  Chronic foley for BPH /urinary retention.   4. LV Thrombus Noted on ECHO. On heparin drip.   5. Choledoncholithiasis S/P ERCP 06/19/19. Per GI   Total Bili coming down 14.8> 10.6  On antibiotics   Move to ICU or Progressive for inotropes. Plan to start dobutamine 3 mcg. Check CVP /CO-OX    Length of Stay: 3  Amy Clegg, NP  06/22/2019, 12:13 PM  Advanced Heart Failure Team Pager [phone] (M-F; [place])  Please contact [facility] for night-coverage after hours (4p -7a ) and weekends on [email or web address]  Patient seen with NP, agree with the above note.   Patient was initially admitted on 10/20 with ascending cholangitis, has developed AKI and acute on chronic systolic CHF, concern for low output failure. He has had ERCP with stone removal from bile duct, now afebrile and on Augmentin. SBP 80s-90s, his main complaint is extensive swelling (legs, abdomen, scrotum).   General: NAD Neck: JVP 16+ cm, no thyromegaly or thyroid nodule.  Lungs: Decreased BS at bases.  CV: Lateral PMI.  Heart regular S1/S2, +S3, 2/6 early SEM.  2+ edema  into the thighs, scrotal edema.  No carotid bruit.  Unable to palpate pedal pulses.  Abdomen: Soft, nontender, no hepatosplenomegaly, mild distention.  Skin: Intact without lesions or rashes.  Neurologic: Alert and oriented x 3.  Psych: Normal affect. Extremities: No clubbing or cyanosis.  HEENT: Normal.   1. Acute/chronic systolic CHF: Concern for low output HF with cardiorenal syndrome with progressive worsening of renal function, creatinine now 4.66.  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  He does, of note, have anterior Qs on ECG.  Echo this admission showed EF < 20%, severe LV dilation, small LV apical thrombus, severely dilated and dysfunctional RV with flattened septum. At this time, SBP generally in 90s. He is markedly volume overloaded on exam.  - Start dobutamine at 3 mcg/kg/min for now for inotropic support.  - He will need central access, will see if we can get PICC placed now since he is on the floor.  He would be a poor HD candidate with what looks like end-stage heart failure in addition to AKI (including severe RV dysfunction).  Follow CVP and co-ox.  - Lasix 80 mg IV bid, start once he starts on dobutamine.  - Unna boots 2. AKI: On CKD stage 3.  Initial creatinine 2.8, now up to 4.66.  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   - Support cardiac output with dobutamine.  - Hopefully we will have some fall in creatinine as renal venous pressure drops, but I am concerned that we may not be successful.  Dialysis would be very difficult for him with severe biventricular failure.  3. 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  4. Ascending cholangitis: S/p ERCP and stone removal.  Now afebrile with normal WBCs.  - Continue Augmentin per primary service.  5. Hyperbilirubinemia: Likely due to combination of CBD obstruction (now relieved) and RV failure with hepatic congestion.   Loralie Champagne 06/22/2019 2:07 PM

## 2019-06-22 NOTE — Consult Note (Signed)
Urology Consult  Referring physician: Dr. Loleta Books Reason for referral: Urinary retention, difficult foley placement  Chief Complaint: abdominal pain  History of Present Illness: [name] is a 78yo with a hx of BPH, DMII, CHF, PVD admitted with abdominal pain and AR. His renal function continued to decline and is currently 4.09. The patient has a hx of urinary retention managed with an indwelling foley. Foley was removed this afternoon and multiple attempts were made to place a foley which were unsuccessful. Urology was consulted for foley placement. Bladder US showed 500cc int he bladder  Past Medical History:  Diagnosis Date  . Acute cholecystitis 11/23/2017  . Acute respiratory failure with hypoxia ([place])   . Acute upper back pain   . AKI (acute kidney injury) ([place])   . Alcohol abuse 02/26/2015  . Anemia, deficiency   . Back pain    LUMBOSACRAL  . Bronchitis, complicated   . Cardiomegaly   . Chronic combined systolic and diastolic CHF (congestive heart failure) ([place])   . Chronic pain    HIGH RISK MED USE COMPREHENSIVE HIGH RISK  . CKD (chronic kidney disease), stage III   . Demand ischemia ([place])    a. minimally elevated troponin peak 0.11 in 2016, felt demand ischemia. Cath offered but pt wished to have done back in [place].  . Diabetes mellitus without complication ([place])   . Edema, leg   . Elevated troponin 02/26/2015  . Essential hypertension   . Fatigue   . Heart abnormality   . Hyperlipidemia   . Hypertension   . Neck pain, acute   . PVD (peripheral vascular disease) ([place])   . Renal lesion 11/23/2017  . SOB (shortness of breath) on exertion   . Solitary kidney   . Vitamin D deficiency    Past Surgical History:  Procedure Laterality Date  . ENDOSCOPIC RETROGRADE CHOLANGIOPANCREATOGRAPHY (ERCP) WITH PROPOFOL N/A 06/19/2019   Procedure: ENDOSCOPIC RETROGRADE CHOLANGIOPANCREATOGRAPHY (ERCP) WITH PROPOFOL;  Surgeon: Milus Banister, MD;  Location: [facility] ENDOSCOPY;  Service:  Endoscopy;  Laterality: N/A;  . IR CHOLANGIOGRAM EXISTING TUBE  12/27/2017  . IR PERC CHOLECYSTOSTOMY  11/25/2017  . IR RADIOLOGIST EVAL & MGMT  02/02/2018  . REMOVAL OF STONES  06/19/2019   Procedure: REMOVAL OF STONES;  Surgeon: Milus Banister, MD;  Location: [facility] ENDOSCOPY;  Service: Endoscopy;;  . Joan Mayans  06/19/2019   Procedure: Joan Mayans;  Surgeon: Milus Banister, MD;  Location: [facility] ENDOSCOPY;  Service: Endoscopy;;    Medications: I have reviewed the patient's current medications. Allergies: No Known Allergies  Family History  Problem Relation Age of Onset  . Other Mother        died in her sleep at 49  . Hypertension Sister   . Hyperlipidemia Sister   . Diabetes Mellitus II Sister   . Heart disease Sister   . CAD Neg Hx        neg hx premature CAD   Social History:  reports that he quit smoking about 11 years ago. His smoking use included cigarettes. He has never used smokeless tobacco. He reports current alcohol use. He reports that he does not use drugs.  ROS  Physical Exam:  Vital signs in last 24 hours: Temp:  [97.4 F (36.3 C)-97.6 F (36.4 C)] 97.6 F (36.4 C) (10/22 2001) Pulse Rate:  [67] 67 (10/22 2001) Resp:  [16-18] 18 (10/22 2001) BP: (86-87)/(55-60) 86/60 (10/22 2001) SpO2:  [99 %-100 %] 100 % (10/22 2001) Physical Exam  Constitutional: He is  oriented to person, place, and time. He appears well-developed and well-nourished.  HENT:  Head: Normocephalic and atraumatic.  Eyes: Pupils are equal, round, and reactive to light. EOM are normal.  Neck: Normal range of motion. No thyromegaly present.  Cardiovascular: Normal rate and regular rhythm.  Respiratory: Effort normal. No respiratory distress.  GI: Soft. He exhibits no distension. Hernia confirmed negative in the right inguinal area and confirmed negative in the left inguinal area.  Genitourinary:    Testes normal.  Uncircumcised.    Genitourinary Comments: Severe penile edema    Musculoskeletal: Normal range of motion.        General: No edema.  Lymphadenopathy:       Right: No inguinal adenopathy present.       Left: No inguinal adenopathy present.  Neurological: He is alert and oriented to person, place, and time.  Skin: Skin is warm and dry.  Psychiatric: He has a normal mood and affect. His behavior is normal. Judgment and thought content normal.    Laboratory Data:  Results for orders placed or performed during the hospital encounter of 06/18/19 (from the past 72 hour(s))  Protime-INR     Status: Abnormal   Collection Time: 06/19/19 12:36 AM  Result Value Ref Range   Prothrombin Time 17.6 (H) 11.4 - 15.2 seconds   INR 1.5 (H) 0.8 - 1.2    Comment: (NOTE) INR goal varies based on device and disease states. Performed at [facility], [place] [address], [place], [place] [address]   Culture, blood (routine x 2)     Status: None (Preliminary result)   Collection Time: 06/19/19  1:17 AM   Specimen: BLOOD LEFT HAND  Result Value Ref Range   Specimen Description BLOOD LEFT HAND    Special Requests      BOTTLES DRAWN AEROBIC AND ANAEROBIC Blood Culture results may not be optimal due to an inadequate volume of blood received in culture bottles   Culture      NO GROWTH 2 DAYS Performed at [facility], [place] [address], [place], [place] [address]    Report Status PENDING   Culture, blood (routine x 2)     Status: None (Preliminary result)   Collection Time: 06/19/19  1:30 AM   Specimen: BLOOD RIGHT HAND  Result Value Ref Range   Specimen Description BLOOD RIGHT HAND    Special Requests      BOTTLES DRAWN AEROBIC AND ANAEROBIC Blood Culture results may not be optimal due to an inadequate volume of blood received in culture bottles   Culture      NO GROWTH 2 DAYS Performed at [facility], [place] [address], [place], [place] [address]    Report Status PENDING   CBG monitoring, ED     Status: Abnormal   Collection Time: 06/19/19  3:48 AM   Result Value Ref Range   Glucose-Capillary 113 (H) 70 - 99 mg/dL  CBG monitoring, ED     Status: Abnormal   Collection Time: 06/19/19  7:50 AM  Result Value Ref Range   Glucose-Capillary 113 (H) 70 - 99 mg/dL   Comment 1 Notify RN    Comment 2 Document in Chart   Comprehensive metabolic panel     Status: Abnormal   Collection Time: 06/19/19  8:19 AM  Result Value Ref Range   Sodium 136 135 - 145 mmol/L   Potassium 4.0 3.5 - 5.1 mmol/L   Chloride 100 98 - 111 mmol/L  CO2 21 (L) 22 - 32 mmol/L   Glucose, Bld 120 (H) 70 - 99 mg/dL   BUN 73 (H) 8 - 23 mg/dL   Creatinine, Ser 2.88 (H) 0.61 - 1.24 mg/dL   Calcium 8.5 (L) 8.9 - 10.3 mg/dL   Total Protein 6.9 6.5 - 8.1 g/dL   Albumin 2.2 (L) 3.5 - 5.0 g/dL   AST 25 15 - 41 U/L   ALT 30 0 - 44 U/L   Alkaline Phosphatase 70 38 - 126 U/L   Total Bilirubin 14.8 (H) 0.3 - 1.2 mg/dL   GFR calc non Af Amer 20 (L) >60 mL/min   GFR calc Af Amer 24 (L) >60 mL/min   Anion gap 15 5 - 15    Comment: Performed at [facility], [address] [place], [place] [address]  CBC     Status: Abnormal   Collection Time: 06/19/19  8:19 AM  Result Value Ref Range   WBC 9.2 4.0 - 10.5 K/uL   RBC 4.07 (L) 4.22 - 5.81 MIL/uL   Hemoglobin 11.7 (L) 13.0 - 17.0 g/dL   HCT 35.6 (L) 39.0 - 52.0 %   MCV 87.5 80.0 - 100.0 fL   MCH 28.7 26.0 - 34.0 pg   MCHC 32.9 30.0 - 36.0 g/dL   RDW 17.0 (H) 11.5 - 15.5 %   Platelets 211 150 - 400 K/uL   nRBC 0.2 0.0 - 0.2 %    Comment: Performed at [facility], [place] [address], [place], [place] [address]  Lactate dehydrogenase     Status: None   Collection Time: 06/19/19  8:19 AM  Result Value Ref Range   LDH 127 98 - 192 U/L    Comment: Performed at [facility], [place] [address] [place], [place] [address]  CBG monitoring, ED     Status: Abnormal   Collection Time: 06/19/19  2:11 PM  Result Value Ref Range   Glucose-Capillary 113 (H) 70 - 99 mg/dL  Renal function panel     Status: Abnormal    Collection Time: 06/19/19  2:13 PM  Result Value Ref Range   Sodium 137 135 - 145 mmol/L   Potassium 4.1 3.5 - 5.1 mmol/L   Chloride 102 98 - 111 mmol/L   CO2 21 (L) 22 - 32 mmol/L   Glucose, Bld 118 (H) 70 - 99 mg/dL   BUN 77 (H) 8 - 23 mg/dL   Creatinine, Ser 3.26 (H) 0.61 - 1.24 mg/dL   Calcium 8.4 (L) 8.9 - 10.3 mg/dL   Phosphorus 5.9 (H) 2.5 - 4.6 mg/dL   Albumin 2.2 (L) 3.5 - 5.0 g/dL   GFR calc non Af Amer 18 (L) >60 mL/min   GFR calc Af Amer 20 (L) >60 mL/min   Anion gap 14 5 - 15    Comment: Performed at [facility] [address] [place], [place] [address]  Magnesium     Status: None   Collection Time: 06/19/19  2:13 PM  Result Value Ref Range   Magnesium 2.1 1.7 - 2.4 mg/dL    Comment: Performed at [place] [address], [place], [place] [address]  CBG monitoring, ED     Status: Abnormal   Collection Time: 06/19/19  5:24 PM  Result Value Ref Range   Glucose-Capillary 110 (H) 70 - 99 mg/dL  CBG monitoring, ED     Status: Abnormal   Collection Time: 06/19/19  8:09 PM  Result Value Ref Range   Glucose-Capillary 128 (H) 70 - 99 mg/dL   Comment 1 Notify RN    Comment 2 Document in Chart   Glucose, capillary     Status: Abnormal   Collection Time: 06/19/19 11:50 PM  Result Value Ref Range   Glucose-Capillary 132 (H) 70 - 99 mg/dL  Glucose, capillary     Status: Abnormal   Collection Time: 06/20/19  3:29 AM  Result Value Ref Range   Glucose-Capillary 117 (H) 70 - 99 mg/dL  CBC     Status: Abnormal   Collection Time: 06/20/19  6:16 AM  Result Value Ref Range   WBC 10.5 4.0 - 10.5 K/uL   RBC 3.99 (L) 4.22 - 5.81 MIL/uL   Hemoglobin 11.8 (L) 13.0 - 17.0 g/dL   HCT 33.7 (L) 39.0 - 52.0 %   MCV 84.5 80.0 - 100.0 fL   MCH 29.6 26.0 - 34.0 pg   MCHC 35.0 30.0 - 36.0 g/dL   RDW 16.5 (H) 11.5 - 15.5 %   Platelets 184 150 - 400 K/uL   nRBC 0.0 0.0 - 0.2 %    Comment: Performed at [facility], [address], [place], [place] [address]   Comprehensive metabolic panel     Status: Abnormal   Collection Time: 06/20/19  6:16 AM  Result Value Ref Range   Sodium 137 135 - 145 mmol/L   Potassium 4.2 3.5 - 5.1 mmol/L   Chloride 100 98 - 111 mmol/L   CO2 22 22 - 32 mmol/L   Glucose, Bld 146 (H) 70 - 99 mg/dL   BUN 90 (H) 8 - 23 mg/dL   Creatinine, Ser 3.76 (H) 0.61 - 1.24 mg/dL   Calcium 8.1 (L) 8.9 - 10.3 mg/dL   Total Protein 6.2 (L) 6.5 - 8.1 g/dL   Albumin 1.8 (L) 3.5 - 5.0 g/dL   AST 19 15 - 41 U/L   ALT 22 0 - 44 U/L   Alkaline Phosphatase 62 38 - 126 U/L   Total Bilirubin 12.8 (H) 0.3 - 1.2 mg/dL   GFR calc non Af Amer 15 (L) >60 mL/min   GFR calc Af Amer 17 (L) >60 mL/min   Anion gap 15 5 - 15    Comment: Performed at [facility], [place] [address], [place], [place] [address]  Glucose, capillary     Status: Abnormal   Collection Time: 06/20/19  8:47 AM  Result Value Ref Range   Glucose-Capillary 134 (H) 70 - 99 mg/dL   Comment 1 Notify RN    Comment 2 Document in Chart   Glucose, capillary     Status: Abnormal   Collection Time: 06/20/19 12:44 PM  Result Value Ref Range   Glucose-Capillary 121 (H) 70 - 99 mg/dL  Glucose, capillary     Status: Abnormal   Collection Time: 06/20/19  5:05 PM  Result Value Ref Range   Glucose-Capillary 125 (H) 70 - 99 mg/dL   Comment 1 Notify RN    Comment 2 Document in Chart   Glucose, capillary     Status: Abnormal   Collection Time: 06/20/19  8:00 PM  Result Value Ref Range   Glucose-Capillary 104 (H) 70 - 99 mg/dL  Glucose, capillary     Status: Abnormal   Collection Time: 06/21/19 12:13 AM  Result Value Ref Range   Glucose-Capillary 131 (H) 70 - 99 mg/dL  Comprehensive metabolic panel     Status: Abnormal  Collection Time: 06/21/19  1:38 AM  Result Value Ref Range   Sodium 137 135 - 145 mmol/L   Potassium 4.3 3.5 - 5.1 mmol/L   Chloride 101 98 - 111 mmol/L   CO2 23 22 - 32 mmol/L   Glucose, Bld 140 (H) 70 - 99 mg/dL   BUN 102 (H) 8 - 23 mg/dL   Creatinine,  Ser 4.09 (H) 0.61 - 1.24 mg/dL   Calcium 8.0 (L) 8.9 - 10.3 mg/dL   Total Protein 6.0 (L) 6.5 - 8.1 g/dL   Albumin 1.8 (L) 3.5 - 5.0 g/dL   AST 20 15 - 41 U/L   ALT 19 0 - 44 U/L   Alkaline Phosphatase 47 38 - 126 U/L   Total Bilirubin 12.9 (H) 0.3 - 1.2 mg/dL   GFR calc non Af Amer 13 (L) >60 mL/min   GFR calc Af Amer 15 (L) >60 mL/min   Anion gap 13 5 - 15    Comment: Performed at [facility], [place] [address], [place], [place] [address]  CBC     Status: Abnormal   Collection Time: 06/21/19  1:38 AM  Result Value Ref Range   WBC 10.7 (H) 4.0 - 10.5 K/uL   RBC 3.64 (L) 4.22 - 5.81 MIL/uL   Hemoglobin 10.4 (L) 13.0 - 17.0 g/dL   HCT 31.0 (L) 39.0 - 52.0 %   MCV 85.2 80.0 - 100.0 fL   MCH 28.6 26.0 - 34.0 pg   MCHC 33.5 30.0 - 36.0 g/dL   RDW 16.8 (H) 11.5 - 15.5 %   Platelets 164 150 - 400 K/uL   nRBC 0.0 0.0 - 0.2 %    Comment: Performed at [facility], [place] [address], [place], [place] [address]  Glucose, capillary     Status: Abnormal   Collection Time: 06/21/19  4:27 AM  Result Value Ref Range   Glucose-Capillary 136 (H) 70 - 99 mg/dL  Glucose, capillary     Status: Abnormal   Collection Time: 06/21/19  8:27 AM  Result Value Ref Range   Glucose-Capillary 100 (H) 70 - 99 mg/dL  Glucose, capillary     Status: Abnormal   Collection Time: 06/21/19 11:32 AM  Result Value Ref Range   Glucose-Capillary 148 (H) 70 - 99 mg/dL  Glucose, capillary     Status: Abnormal   Collection Time: 06/21/19  4:04 PM  Result Value Ref Range   Glucose-Capillary 122 (H) 70 - 99 mg/dL  Glucose, capillary     Status: Abnormal   Collection Time: 06/21/19  7:58 PM  Result Value Ref Range   Glucose-Capillary 101 (H) 70 - 99 mg/dL   Comment 1 Notify RN    Recent Results (from the past 240 hour(s))  Urine culture     Status: Abnormal   Collection Time: 06/18/19 11:05 PM   Specimen: Urine, Random  Result Value Ref Range Status   Specimen Description URINE, RANDOM  Final   Special  Requests   Final    NONE Performed at [facility], [place] [address], [place], [place] [address]    Culture >=100,000 COLONIES/mL KLEBSIELLA PNEUMONIAE (A)  Final   Report Status 06/21/2019 FINAL  Final   Organism ID, Bacteria KLEBSIELLA PNEUMONIAE (A)  Final      Susceptibility   Klebsiella pneumoniae - MIC*    AMPICILLIN RESISTANT Resistant     CEFAZOLIN <=4 SENSITIVE Sensitive     CEFTRIAXONE <=1 SENSITIVE Sensitive  CIPROFLOXACIN <=0.25 SENSITIVE Sensitive     GENTAMICIN <=1 SENSITIVE Sensitive     IMIPENEM <=0.25 SENSITIVE Sensitive     NITROFURANTOIN 32 SENSITIVE Sensitive     TRIMETH/SULFA <=20 SENSITIVE Sensitive     AMPICILLIN/SULBACTAM 4 SENSITIVE Sensitive     PIP/TAZO <=4 SENSITIVE Sensitive     Extended ESBL NEGATIVE Sensitive     * >=100,000 COLONIES/mL KLEBSIELLA PNEUMONIAE  SARS Coronavirus 2 by RT PCR (hospital order, performed in [place] hospital lab) Nasopharyngeal Nasopharyngeal Swab     Status: None   Collection Time: 06/18/19 11:25 PM   Specimen: Nasopharyngeal Swab  Result Value Ref Range Status   SARS Coronavirus 2 NEGATIVE NEGATIVE Final    Comment: (NOTE) If result is NEGATIVE SARS-CoV-2 target nucleic acids are NOT DETECTED. The SARS-CoV-2 RNA is generally detectable in upper and lower  respiratory specimens during the acute phase of infection. The lowest  concentration of SARS-CoV-2 viral copies this assay can detect is 250  copies / mL. A negative result does not preclude SARS-CoV-2 infection  and should not be used as the sole basis for treatment or other  patient management decisions.  A negative result may occur with  improper specimen collection / handling, submission of specimen other  than nasopharyngeal swab, presence of viral mutation(s) within the  areas targeted by this assay, and inadequate number of viral copies  (<250 copies / mL). A negative result must be combined with clinical  observations, patient history, and  epidemiological information. If result is POSITIVE SARS-CoV-2 target nucleic acids are DETECTED. The SARS-CoV-2 RNA is generally detectable in upper and lower  respiratory specimens dur ing the acute phase of infection.  Positive  results are indicative of active infection with SARS-CoV-2.  Clinical  correlation with patient history and other diagnostic information is  necessary to determine patient infection status.  Positive results do  not rule out bacterial infection or co-infection with other viruses. If result is PRESUMPTIVE POSTIVE SARS-CoV-2 nucleic acids MAY BE PRESENT.   A presumptive positive result was obtained on the submitted specimen  and confirmed on repeat testing.  While 2019 novel coronavirus  (SARS-CoV-2) nucleic acids may be present in the submitted sample  additional confirmatory testing may be necessary for epidemiological  and / or clinical management purposes  to differentiate between  SARS-CoV-2 and other Sarbecovirus currently known to infect humans.  If clinically indicated additional testing with an alternate test  methodology [id number]) is advised. The SARS-CoV-2 RNA is generally  detectable in upper and lower respiratory sp ecimens during the acute  phase of infection. The expected result is Negative. Fact Sheet for Patients:  StrictlyIdeas.no Fact Sheet for Healthcare Providers: BankingDealers.co.za This test is not yet approved or cleared by the [place] FDA and has been authorized for detection and/or diagnosis of SARS-CoV-2 by FDA under an Emergency Use Authorization (EUA).  This EUA will remain in effect (meaning this test can be used) for the duration of the COVID-19 declaration under Section 564(b)(1) of the Act, 21 U.S.C. section 360bbb-3(b)(1), unless the authorization is terminated or revoked sooner. Performed at [facility], [place] [address], [place], [place] [address]   Culture,  blood (routine x 2)     Status: None (Preliminary result)   Collection Time: 06/19/19  1:17 AM   Specimen: BLOOD LEFT HAND  Result Value Ref Range Status   Specimen Description BLOOD LEFT HAND  Final   Special Requests   Final    BOTTLES DRAWN  AEROBIC AND ANAEROBIC Blood Culture results may not be optimal due to an inadequate volume of blood received in culture bottles   Culture   Final    NO GROWTH 2 DAYS Performed at [facility], [place] [address] [place], [place] [address]    Report Status PENDING  Incomplete  Culture, blood (routine x 2)     Status: None (Preliminary result)   Collection Time: 06/19/19  1:30 AM   Specimen: BLOOD RIGHT HAND  Result Value Ref Range Status   Specimen Description BLOOD RIGHT HAND  Final   Special Requests   Final    BOTTLES DRAWN AEROBIC AND ANAEROBIC Blood Culture results may not be optimal due to an inadequate volume of blood received in culture bottles   Culture   Final    NO GROWTH 2 DAYS Performed at [facility], [place] [address] [place], [place] [address]    Report Status PENDING  Incomplete   Creatinine: Recent Labs    06/18/19 2054 06/19/19 0819 06/19/19 1413 06/20/19 0616 06/21/19 0138  CREATININE 3.09* 2.88* 3.26* 3.76* 4.09*   Baseline Creatinine: 2  Impression/Assessment:  77yo with ARF, BPH with LUTS and concern for urinary retention  Plan:  1. 18 french coude placed and 60cc of dark urine promptly drained. Foley was irrigated to ensure proper placement. Please contrinue indwelling foley and the patient can followup at [facility] 1-2 weeks after discharge   Nicolette Bang 06/22/2019, 12:06 AM

## 2019-06-22 NOTE — Progress Notes (Signed)
Pt arrived from 6N. BP 102/68 (78), rectal temperature 96.6, HR 62, O2 97 on room air. MD paged about low temp. Telemetry applied. IV team at bedside to place PICC.  Clyde Canterbury, RN

## 2019-06-22 NOTE — Progress Notes (Signed)
[facility]    NEPHROLOGY PROGRESS NOTE  SUBJECTIVE: Patient seen and examined.    OBJECTIVE:  Vitals:   06/22/19 1504 06/22/19 1600  BP: 102/68   Pulse:  65  Resp: (!) 31 (!) 23  Temp: (!) 96.6 F (35.9 C)   SpO2: 97% 99%    Intake/Output Summary (Last 24 hours) at 06/22/2019 1710 Last data filed at 06/22/2019 0400 Gross per 24 hour  Intake 498 ml  Output 325 ml  Net 173 ml      General:  AAOx3 NAD HEENT: MMM [place] AT anicteric sclera Neck:  No JVD, no adenopathy CV:  Heart RRR  Lungs:  L/S CTA bilaterally Abd:  abd SNT/ND with normal BS GU:  Bladder non-palpable Extremities: 3+ bilateral lower extremity edema skin:  No skin rash  MEDICATIONS:  . amoxicillin-clavulanate  1 tablet Oral Q12H  . aspirin EC  81 mg Oral Daily  . Chlorhexidine Gluconate Cloth  6 each Topical Daily  . feeding supplement (ENSURE ENLIVE)  237 mL Oral BID BM  . finasteride  5 mg Oral Daily  . furosemide  80 mg Intravenous BID  . insulin aspart  0-9 Units Subcutaneous Q4H  . multivitamin with minerals  1 tablet Oral Daily  . saccharomyces boulardii  250 mg Oral BID  . sodium chloride flush  10-40 mL Intracatheter Q12H  . sodium chloride flush  3 mL Intravenous Once  . tamsulosin  0.4 mg Oral Daily       LABS:   CBC Latest Ref Rng & Units 06/22/2019 06/21/2019 06/20/2019  WBC 4.0 - 10.5 K/uL 6.4 10.7(H) 10.5  Hemoglobin 13.0 - 17.0 g/dL 10.0(L) 10.4(L) 11.8(L)  Hematocrit 39.0 - 52.0 % 28.3(L) 31.0(L) 33.7(L)  Platelets 150 - 400 K/uL 145(L) 164 184    CMP Latest Ref Rng & Units 06/22/2019 06/21/2019 06/20/2019  Glucose 70 - 99 mg/dL 112(H) 140(H) 146(H)  BUN 8 - 23 mg/dL 113(H) 102(H) 90(H)  Creatinine 0.61 - 1.24 mg/dL 4.66(H) 4.09(H) 3.76(H)  Sodium 135 - 145 mmol/L 137 137 137  Potassium 3.5 - 5.1 mmol/L 4.0 4.3 4.2  Chloride 98 - 111 mmol/L 103 101 100  CO2 22 - 32 mmol/L 23 23 22   Calcium 8.9 - 10.3 mg/dL 8.0(L) 8.0(L) 8.1(L)  Total Protein 6.5 - 8.1  g/dL 6.3(L) 6.0(L) 6.2(L)  Total Bilirubin 0.3 - 1.2 mg/dL 10.6(H) 12.9(H) 12.8(H)  Alkaline Phos 38 - 126 U/L 39 47 62  AST 15 - 41 U/L 18 20 19   ALT 0 - 44 U/L 20 19 22     Lab Results  Component Value Date   CALCIUM 8.0 (L) 06/22/2019   CAION 1.14 (L) 09/13/2018   PHOS 5.9 (H) 06/19/2019       Component Value Date/Time   COLORURINE AMBER (A) 06/21/2019 0916   APPEARANCEUR CLOUDY (A) 06/21/2019 0916   LABSPEC 1.020 06/21/2019 0916   PHURINE 5.0 06/21/2019 0916   GLUCOSEU NEGATIVE 06/21/2019 0916   HGBUR MODERATE (A) 06/21/2019 0916   BILIRUBINUR SMALL (A) 06/21/2019 0916   KETONESUR NEGATIVE 06/21/2019 0916   PROTEINUR 30 (A) 06/21/2019 0916   NITRITE NEGATIVE 06/21/2019 0916   LEUKOCYTESUR MODERATE (A) 06/21/2019 0916      Component Value Date/Time   PHART 7.196 (LL) 02/26/2015 0208   PCO2ART 58.7 (HH) 02/26/2015 0208   PO2ART 280.0 (H) 02/26/2015 0208   HCO3 22.7 02/26/2015 0208   TCO2 25 09/13/2018 0604   ACIDBASEDEF 6.0 (H) 02/26/2015 0208   O2SAT 100.0 02/26/2015  0208    No results found for: IRON, TIBC, FERRITIN, IRONPCTSAT     ASSESSMENT/PLAN:     1. CKD stage III-IV.  Baseline serum creatinine around 2-2.2  2. Acute kidney injury.  Likely secondary to hemodynamics.  He has had some urine sediment but overall picture is most consistent with ATN.  No indication for urgent dialysis.  Continue to monitor.  Urine output borderline but steady.  Patient has not wanted dialysis.  Carvedilol held due to low blood pressures.  Renal ultrasound with increased echogenicity but no obstruction.  Doubt AIN related to Zosyn due to short timeframe of exposure. 3.  CHF - continue dobutamine and Lasix.  Appreciate cardiology input 4.  DM 5.  BPH with chronic indwelling foley; just changed out Monday. 6.  Abd pain secondary to choledocholithiasis and cholangitis, s/p stone removal.      Gaylene Brooks, DO, FACP

## 2019-06-22 NOTE — Progress Notes (Addendum)
Progress Note  Patient Name: [name] Date of Encounter: 06/22/2019  Primary Cardiologist: Candee Furbish, MD   Subjective   Feeling OK.  Complains of diarrhea.  Denies chest pain or shortness of breath.   Inpatient Medications    Scheduled Meds:  amoxicillin-clavulanate  1 tablet Oral Q12H   aspirin EC  81 mg Oral Daily   Chlorhexidine Gluconate Cloth  6 each Topical Daily   feeding supplement (ENSURE ENLIVE)  237 mL Oral BID BM   finasteride  5 mg Oral Daily   insulin aspart  0-9 Units Subcutaneous Q4H   multivitamin with minerals  1 tablet Oral Daily   sodium chloride flush  3 mL Intravenous Once   tamsulosin  0.4 mg Oral Daily   Continuous Infusions:  DOPamine     PRN Meds: alum & mag hydroxide-simeth, ondansetron (ZOFRAN) IV   Vital Signs    Vitals:   06/21/19 0432 06/21/19 1138 06/21/19 2001 06/22/19 0451  BP: (!) 86/55 (!) 87/60 (!) 86/60 (!) 91/56  Pulse: 67 67 67 67  Resp: 16 16 18 18   Temp: (!) 97.4 F (36.3 C)  97.6 F (36.4 C) (!) 97.4 F (36.3 C)  TempSrc: Oral  Oral Oral  SpO2: 99% 100% 100% 99%  Weight:      Height:        Intake/Output Summary (Last 24 hours) at 06/22/2019 1050 Last data filed at 06/22/2019 0400 Gross per 24 hour  Intake 498 ml  Output 325 ml  Net 173 ml   Last 3 Weights 06/19/2019 06/19/2019 02/12/2019  Weight (lbs) 198 lb 6.6 oz 198 lb 6.6 oz 190 lb  Weight (kg) 90 kg 90 kg 86.183 kg  Some encounter information is confidential and restricted. Go to Review Flowsheets activity to see all data.      Telemetry    Sinus rhythm.  NSVT up to 7 beats.  - Personally Reviewed  ECG    N/a - Personally Reviewed  Physical Exam   VS:  BP (!) 91/56 (BP Location: Left Arm)    Pulse 67    Temp (!) 97.4 F (36.3 C) (Oral)    Resp 18    Ht 5\' 11"  (1.803 m)    Wt 90 kg    SpO2 99%    BMI 27.67 kg/m  , BMI Body mass index is 27.67 kg/m. GENERAL:  Chronically ill-appearing.  Visibly tachypneic HEENT: Pupils  equal round and reactive, fundi not visualized, oral mucosa unremarkable NECK: + jugular venous distention sitting upright. waveform within normal limits, carotid upstroke brisk and symmetric, no bruits, no thyromegaly LYMPHATICS:  No cervical adenopathy LUNGS:  Clear to auscultation bilaterally HEART:  Irregularly irregular  PMI not displaced or sustained,S1 and S2 within normal limits, no S3, no S4, no clicks, no rubs, no murmurs ABD:  RUQ TTP.  No rebound/guarding.   EXT:  2 plus pulses throughout, 1+ woody edema, no cyanosis no clubbing SKIN:  No rashes no nodules NEURO:  Cranial nerves II through XII grossly intact, motor grossly intact throughout PSYCH:  Cognitively intact, oriented to person place and time   Labs    High Sensitivity Troponin:  No results for input(s): TROPONINIHS in the last 720 hours.    Chemistry Recent Labs  Lab 06/20/19 0616 06/21/19 0138 06/22/19 0401  NA 137 137 137  K 4.2 4.3 4.0  CL 100 101 103  CO2 22 23 23   GLUCOSE 146* 140* 112*  BUN 90* 102* 113*  CREATININE  3.76* 4.09* 4.66*  CALCIUM 8.1* 8.0* 8.0*  PROT 6.2* 6.0* 6.3*  ALBUMIN 1.8* 1.8* 1.8*  AST 19 20 18   ALT 22 19 20   ALKPHOS 62 47 39  BILITOT 12.8* 12.9* 10.6*  GFRNONAA 15* 13* 11*  GFRAA 17* 15* 13*  ANIONGAP 15 13 11      Hematology Recent Labs  Lab 06/20/19 0616 06/21/19 0138 06/22/19 0401  WBC 10.5 10.7* 6.4  RBC 3.99* 3.64* 3.41*  HGB 11.8* 10.4* 10.0*  HCT 33.7* 31.0* 28.3*  MCV 84.5 85.2 83.0  MCH 29.6 28.6 29.3  MCHC 35.0 33.5 35.3  RDW 16.5* 16.8* 16.9*  PLT 184 164 145*    BNP Recent Labs  Lab 06/18/19 2109  BNP 2,536.0*     DDimer No results for input(s): DDIMER in the last 168 hours.   Radiology    Ct Abdomen Pelvis Wo Contrast  Result Date: 06/20/2019 CLINICAL DATA:  Concern for high-grade small bowel obstruction EXAM: CT ABDOMEN AND PELVIS WITHOUT CONTRAST TECHNIQUE: Multidetector CT imaging of the abdomen and pelvis was performed following  the standard protocol without IV contrast. COMPARISON:  CT abdomen pelvis dated 06/19/2019 FINDINGS: Lower chest: Moderate bilateral pleural effusions with associated atelectasis are partially imaged. The heart is enlarged. Hepatobiliary: Multiple small hypoattenuating lesions in the liver are not significantly changed since 11/23/2017. And likely represent cysts. The gallbladder is decompressed. The common bile duct measures up to 10 mm in diameter. No distal common bile duct stone is identified on today's exam. Pancreas: Unremarkable. No pancreatic ductal dilatation or surrounding inflammatory changes. Spleen: Normal in size without focal abnormality. Adrenals/Urinary Tract: Adrenal glands are unremarkable. A 4 mm area of hyperdensity in the right kidney may represent a renal stone. Otherwise, the kidneys are normal, without renal calculi, focal lesion, or hydronephrosis. Bladder is decompressed with a Foley catheter. Stomach/Bowel: Stomach is within normal limits. Enteric contrast reaches the transverse colon. There is colonic diverticulosis. Appendix appears normal. Multiple borderline dilated loops of small bowel are seen throughout the mid abdomen. No discrete transition point is identified. Vascular/Lymphatic: Aortic atherosclerosis. No enlarged abdominal or pelvic lymph nodes. Reproductive: Prostate is unremarkable. Other: Abdominal ascites and anasarca are redemonstrated. No abdominal wall hernia is identified. 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 Degenerative changes are seen in the lumbar spine. IMPRESSION: 1. Multiple borderline dilated loops of small bowel throughout the mid abdomen. No discrete transition point is identified. Findings may represent ileus or partial obstruction given that oral contrast reaches the transverse colon. 2. Moderate bilateral pleural effusions with associated atelectasis. 3. Abdominal ascites and  anasarca. Aortic Atherosclerosis (ICD10-I70.0). Electronically Signed   By: Zerita Boers M.D.   On: 06/20/2019 21:34   US Renal  Result Date: 06/21/2019 CLINICAL DATA:  Acute renal failure on chronic kidney disease EXAM: RENAL / URINARY TRACT ULTRASOUND COMPLETE COMPARISON:  CT 06/20/2019 FINDINGS: Right Kidney: Renal measurements: 12.3 x 4.8 x 6.6 cm = volume: 205 mL. Diffusely increased echotexture. No mass or hydronephrosis. Left Kidney: Renal measurements: 12.0 x 5.8 x 6.1 cm = volume: 220 mL. Diffusely increased echotexture. No mass or hydronephrosis. Bladder: Decompressed with Foley catheter in place. Other: Moderate ascites in the abdomen and pelvis. IMPRESSION: Increased echotexture within the kidneys compatible with chronic medical renal disease. No hydronephrosis. Moderate ascites. Electronically Signed   By: Rolm Baptise M.D.   On: 06/21/2019 11:12   [place] Ekg Site Rite  Result Date: 06/22/2019 If [employer]  not attached, placement could not be confirmed due to current cardiac rhythm.  Dg Esophagus W Single Cm (sol Or Thin Ba)  Result Date: 06/20/2019 CLINICAL DATA:  Post ERCP, history of globus sensation. EXAM: ESOPHOGRAM/BARIUM SWALLOW TECHNIQUE: Single contrast examination was performed using water-soluble contrast. FLUOROSCOPY TIME:  Fluoroscopy Time:  1 minutes 18 seconds Radiation Exposure Index (if provided by the fluoroscopic device): 7 mGy Number of Acquired Spot Images: 0 COMPARISON:  None. FINDINGS: Only a small amount of water-soluble contrast could be swallowed due to patient comfort. Limited assessment shows no signs of esophageal obstruction or leak. Luminal characteristics are difficult to evaluate given small volume. There is likely also a small hiatal hernia. Imaging of the upper abdomen shows marked distention of some akin small-bowel loops. Due to patient nausea and the marked distension of bowel in the upper abdomen the study was terminated at this time. IMPRESSION:  Limited esophageal assessment without signs of gross leak or narrowing. Only a small amount of water-soluble contrast could be swallowed. Ileus versus bowel obstruction incidentally noted in the upper abdomen. This and patient comfort precluded further evaluation. Following resolution of acute symptoms repeat esophageal assessment could be performed. Electronically Signed   By: Zetta Bills M.D.   On: 06/20/2019 12:56    Cardiac Studies   TTE: 10/2017  Study Conclusions  - Left ventricle: The cavity size was normal. Wall thickness was increased in a pattern of moderate LVH. Systolic function was severely reduced. The estimated ejection fraction was in the range of 20% to 25%. Diffuse hypokinesis. The study is not technically sufficient to allow evaluation of LV diastolic function. - Mitral valve: Mildly thickened leaflets . There was trivial regurgitation. - Left atrium: The atrium was normal in size. - Right ventricle: The cavity size was mildly dilated. - Tricuspid valve: There was trivial regurgitation. - Pulmonary arteries: PA peak pressure: 26 mm Hg (S). - Inferior vena cava: The vessel was dilated. The respirophasic diameter changes were blunted (<50%), consistent with elevated central venous pressure.  Impressions:  - Compared to a prior study in 2016, the LVEF is slightly lower at 20-25%.  TTE 06/22/19: LVEF 10-15%.  Dilated LV.  RV moderately reduced systolic function and dilated.  IV septum flattened in diastole indicating volume overload.  L pleural effusion.  Apical thrombus.    Patient Profile     Mr. Jha is a 17M with chronic systolic and diastolic heart failure, diabetes, CKD III, EtOH abuse, hypertension and hyperlipidemia admitted with recurrent choledocholithiasis and cholangitis.    Assessment & Plan    # Chronic systolic and diastolic heart failure: # Essential hypertension: LVEF reduced to 10-15% this admission.  IVC dilated  indicating RA pressure is at least 15 mmHg.  Worsening renal function is likely cardiorenal in the setting of intravascular volume depletion from nausea/vomiting/poor oral intake 2/2 cholangitis.  It continues to worsen with IV fluids.  Given his renal dysfunction would not start milrinone.  We will get a PICC line, check co-ox and start dobutamine.  Home carvedilol is on hold 2/2 cardiogenic shock.  Will ask advanced HF team to see him.  He reports a stress test in the past htough records are not available.  No LHC 2/2 renal dysfunction.  Will transfer to ICU for higher level of care.  # LV thrombus:  Noted on echo today.  Will start heparin.  # NSVT:  Asymptomatic.   Keep K>4, Mg >2. Beta blocker held for hypotension.   # Acute on  chronic renal failure: Likely cardiorenal as above.  Worsened in the setting of poor oral intake and diuretics.  Did not improve with IV fluids.  There is also concern for urinary retention with difficult foley placement.  Lasix and Entresto are on hold.      Time spent: 45 minutes-Greater than 50% of this time was spent in counseling, explanation of diagnosis, planning of further management, and coordination of care.      For questions or updates, please contact [place] Please consult [email or web address] for contact info under        Signed, Skeet Latch, MD  06/22/2019, 10:50 AM

## 2019-06-22 NOTE — Progress Notes (Signed)
Orthopedic Tech Progress Note Patient Details:  [name] 1941/08/12 [id number]  Ortho Devices Type of Ortho Device: Haematologist Ortho Device/Splint Location: Bilateral unna boots Ortho Device/Splint Interventions: Application   Post Interventions Patient Tolerated: Well Instructions Provided: Care of device   [name] 06/22/2019, 6:17 PM

## 2019-06-22 NOTE — Progress Notes (Signed)
ANTICOAGULATION CONSULT NOTE - Follow-Up Consult  Pharmacy Consult for Heparin Indication: LV thrombus  No Known Allergies  Patient Measurements: Height: 5\' 11"  (180.3 cm) Weight: 198 lb 6.6 oz (90 kg) IBW/kg (Calculated) : 75.3  Heparin dosing weight: 90 kg  Vital Signs: Temp: 97.5 F (36.4 C) (10/23 2011) Temp Source: Oral (10/23 2011) BP: 105/65 (10/23 2011) Pulse Rate: 67 (10/23 2011)  Labs: Recent Labs    06/20/19 0616 06/21/19 0138 06/22/19 0401 06/22/19 2030  HGB 11.8* 10.4* 10.0*  --   HCT 33.7* 31.0* 28.3*  --   PLT 184 164 145*  --   HEPARINUNFRC  --   --   --  <0.10*  CREATININE 3.76* 4.09* 4.66*  --     Estimated Creatinine Clearance: 14.6 mL/min (A) (by C-G formula based on SCr of 4.66 mg/dL (H)).  Assessment: 78 year old male with CHF (EF 10-15%), acute renal failure, and acute cholangitis to begin heparin for new LV thrombus. Scr continuing to trend up (4.66 today; baseline Scr ~2.0).  Initial heparin level drawn ~9 hrs after heparin 3000 units IV bolus and initiation of heparin infusion at 1100 units/hr was subtherapeutic (<0.10 units/ml). Per RN, no issues with IV or bleeding observed.  H/H/platelets trending down; currently 10.0/28.3/145  Goal of Therapy:  Heparin level 0.3-0.7 units/ml Monitor platelets by anticoagulation protocol: Yes   Plan:  Heparin bolus 1500 units IV X 1 Increase heparin infusion to 1400 units/hr Check 8-hr heparin level Monitor daily heparin level, CBC Monitor for signs/symptoms of bleeding  Gillermina Hu, PharmD, BCPS, [facility] Clinical Pharmacist 06/22/2019,9:23 PM

## 2019-06-22 NOTE — Progress Notes (Addendum)
[facility] Progress Note    Since last GI note: Large loose BM yesterday, his first in several days.  Feels better afterwards.  No abdominal pains.  He is tolerating clear liquids.  Objective: Vital signs in last 24 hours: Temp:  [97.4 F (36.3 C)-97.6 F (36.4 C)] 97.4 F (36.3 C) (10/23 0451) Pulse Rate:  [67] 67 (10/23 0451) Resp:  [16-18] 18 (10/23 0451) BP: (86-91)/(56-60) 91/56 (10/23 0451) SpO2:  [99 %-100 %] 99 % (10/23 0451) Last BM Date: 06/21/19 General: alert and oriented times 3 Heart: regular rate and rythm Abdomen: soft, non-tender, non-distended, normal bowel sounds   Lab Results: Recent Labs    06/20/19 0616 06/21/19 0138 06/22/19 0401  WBC 10.5 10.7* 6.4  HGB 11.8* 10.4* 10.0*  PLT 184 164 145*  MCV 84.5 85.2 83.0   Recent Labs    06/20/19 0616 06/21/19 0138 06/22/19 0401  NA 137 137 137  K 4.2 4.3 4.0  CL 100 101 103  CO2 22 23 23   GLUCOSE 146* 140* 112*  BUN 90* 102* 113*  CREATININE 3.76* 4.09* 4.66*  CALCIUM 8.1* 8.0* 8.0*   Recent Labs    06/20/19 0616 06/21/19 0138 06/22/19 0401  PROT 6.2* 6.0* 6.3*  ALBUMIN 1.8* 1.8* 1.8*  AST 19 20 18   ALT 22 19 20   ALKPHOS 62 47 39  BILITOT 12.8* 12.9* 10.6*     Medications: Scheduled Meds: . aspirin EC  81 mg Oral Daily  . Chlorhexidine Gluconate Cloth  6 each Topical Daily  . feeding supplement (ENSURE ENLIVE)  237 mL Oral BID BM  . finasteride  5 mg Oral Daily  . insulin aspart  0-9 Units Subcutaneous Q4H  . multivitamin with minerals  1 tablet Oral Daily  . sodium chloride flush  3 mL Intravenous Once  . tamsulosin  0.4 mg Oral Daily   Continuous Infusions: . piperacillin-tazobactam (ZOSYN)  IV 2.25 g (06/22/19 0505)   PRN Meds:.alum & mag hydroxide-simeth, ondansetron (ZOFRAN) IV, promethazine **OR** promethazine **OR** promethazine    Assessment/Plan: 78 y.o. male with complex gallstone disease  S/p ERCP 3 days ago, cleared CBD of bile duct stones, +  purulence noted. He's been on IV abx for 3-4 days now.  Has been afebrile and he is non-tender on exam, WBC is normal and so I think it is safe to change to oral antibiotics (augmentin bid for 10 days).  Will advance diet to heart healthy, he is currently on clears.    Bilirubin slow to improve after ERCP, stone removal.  This may be somewhat related to his rising Creatinine, poor urine clearance of bili.  Please call or page with any further questions or concerns.   Milus Banister, MD  06/22/2019, 8:03 AM [facility] Pager [phone]

## 2019-06-22 NOTE — Progress Notes (Signed)
Awaiting approval from Nephrology before proceeding with PICC insertion.  Dr Moshe Cipro contacted through secure chat

## 2019-06-22 NOTE — Progress Notes (Signed)
PROGRESS NOTE    [name]  OJJ:[id number] DOB: 06/27/1941 DOA: 06/18/2019 PCP: [name], MD      Brief Narrative:  [name] is a 78 y.o. M with HTN, sCHF EF 25%, CKD III baseline Cr 2 and alcohol use who presented with 4-5 days progressive abdominal pain with vomiting.  In the ER, US abdomen showed gallstones, was admitted for possible cholecystitis.     Assessment & Plan:  Acute cardiogenic shock EF 25% at baseline.  Patient initially started on Lasix, Cardiology consulted.    Subsequently, diuretics held, started on fluids.  They echocardiogram obtained which showed EF 15%, signs of decompensated biventricular failure. -Hold carvedilol -Hold Entresto -Cardiology will start dobutamine in transfer to progressive care -Consult CHF team, appreciate expert cares -Lasix per CHF team -CVP and coags per CHF team -Unna boots  -Continue aspirin   Acute choledocholithiasis and cholangitis Still afebrile and without WBC. blood cultures no growth.  We have been trending slightly down, may be partly congestive as cardiology opined.  -Repeat CMP -Stop Zosyn, start augmentin -Consult GI -If new WBC, or RUQ pain, will need to consult IR for perc drain  Diarrhea 5 stools yesterday, two so far today.  Hope this is simply abx-assoc diarrhea.  Certainly have alternative explanation for ARF, and there is no high WBC to suggest Cdiff. -Start probiotic, monitor stools, WBC  Acute renal failure on CKD stage III Baseline creatinine is around 2, Cr continuing to trend up >4.6 today.  Renal US unremarkable.  FenA implies underperfusion, c/w cardiogenic shock. -Dobutamine started by Cardiology -Daily BMP -Consult Palliative Care   Ileus CT obtained due to patient abdominal discomfort, distension and nausea showed mild ileus.  Suspect this was from #1 above.  Diabetes Glucoses okay -Continue sliding scale corrections  BPH Foley replaced overnight, this is a  chronic indwelling foley -Continue finasteride, Flomax       MDM and disposition: The below labs and imaging reports reviewed and summarized above.  Medication management as above. Severe worsening life threatening illness.  The patient was admitted with abdominal pain, found to have cholangitis.  Now post ERCP, infection appears improved, but worsening renal failure and now caridogenic shock.          DVT prophylaxis: SCDs Code Status: Full code Family Communication:     Consultants:   Cardiology  Gastroenterology  Nephrology  Palliative care   Procedures:   10/20 ERCP -- stones, purulent cholangitis  10/21 CT abdomen -- mild partial ileus  10/22 US renal  10/23 echo LVEF 10-15%.  Dilated LV.  RV moderately reduced systolic function and dilated.  IV septum flattened in diastole indicating volume overload.  L pleural effusion.  Apical thrombus.    10/23 PICC placement  Antimicrobials:   Zosyn 10/19 >> 10/22  Augmentin 10/23 >>  Culture data:   10/19 urine culture -- Klebsiella, unasyn/zosyn sensistive  10/20 blood culture x2 -- NGTD  10/22 urine culture ngtd    Subjective: Endorses nausea, lower abdominal pain, swelling in the legs and scrotum.  No orthopnea.      Objective: Vitals:   06/21/19 2001 06/22/19 0451 06/22/19 1438 06/22/19 1504  BP: (!) 86/60 (!) 91/56 99/61 102/68  Pulse: 67 67 63   Resp: 18 18 20  (!) 31  Temp: 97.6 F (36.4 C) (!) 97.4 F (36.3 C) (!) 97.5 F (36.4 C) (!) 96.6 F (35.9 C)  TempSrc: Oral Oral Axillary Rectal  SpO2: 100% 99% 100% 97%  Weight:      Height:        Intake/Output Summary (Last 24 hours) at 06/22/2019 1554 Last data filed at 06/22/2019 0400 Gross per 24 hour  Intake 498 ml  Output 325 ml  Net 173 ml   Filed Weights   06/19/19 0103 06/19/19 1128  Weight: 90 kg 90 kg    Examination: General appearance: Male, sitting in recliner, no acute distress, interactive,   HEENT: Sclera  anicteric, conjunctival pink, lids and lashes normal.  No nasal deformity, discharge, or epistaxis.  Lips moist, dentition poor, oropharynx moist, no oral lesions, hearing normal. Skin:   Cardiac: RRR, no murmurs appreciated, brawny changes of the legs, but minimal pitting edema at the shins Respiratory: Normal respiratory rate and rhythm, appears dyspneic with exertion, lungs clear without rales or wheezes Abdomen: Abdomen soft, no focal tenderness to palpation, but diffusely uncomfortable, still distended. MSK: Diffuse loss of subcutaneous muscle mass and fat. Neuro: Awake and alert, naming is grossly intact, patient's recall, recent and remote as well as general fund of knowledge seem somewhat impaired, moves upper extremities with global weakness, but symmetric strength, speech fluent. Psych: Sensorium intact responding to questions, attention normal, affect blunted, judgment insight appear at baseline.       Data Reviewed: I have personally reviewed following labs and imaging studies:  CBC: Recent Labs  Lab 06/18/19 2054 06/19/19 0819 06/20/19 0616 06/21/19 0138 06/22/19 0401  WBC 9.9 9.2 10.5 10.7* 6.4  HGB 12.1* 11.7* 11.8* 10.4* 10.0*  HCT 36.3* 35.6* 33.7* 31.0* 28.3*  MCV 88.3 87.5 84.5 85.2 83.0  PLT 226 211 184 164 027*   Basic Metabolic Panel: Recent Labs  Lab 06/19/19 0819 06/19/19 1413 06/20/19 0616 06/21/19 0138 06/22/19 0401  NA 136 137 137 137 137  K 4.0 4.1 4.2 4.3 4.0  CL 100 102 100 101 103  CO2 21* 21* 22 23 23   GLUCOSE 120* 118* 146* 140* 112*  BUN 73* 77* 90* 102* 113*  CREATININE 2.88* 3.26* 3.76* 4.09* 4.66*  CALCIUM 8.5* 8.4* 8.1* 8.0* 8.0*  MG  --  2.1  --   --   --   PHOS  --  5.9*  --   --   --    GFR: Estimated Creatinine Clearance: 14.6 mL/min (A) (by C-G formula based on SCr of 4.66 mg/dL (H)). Liver Function Tests: Recent Labs  Lab 06/18/19 2054 06/19/19 0819 06/19/19 1413 06/20/19 0616 06/21/19 0138 06/22/19 0401  AST 31 25   --  19 20 18   ALT 37 30  --  22 19 20   ALKPHOS 89 70  --  62 47 39  BILITOT 13.4* 14.8*  --  12.8* 12.9* 10.6*  PROT 7.7 6.9  --  6.2* 6.0* 6.3*  ALBUMIN 2.5* 2.2* 2.2* 1.8* 1.8* 1.8*   Recent Labs  Lab 06/18/19 2054  LIPASE 220*   No results for input(s): AMMONIA in the last 168 hours. Coagulation Profile: Recent Labs  Lab 06/19/19 0036  INR 1.5*   Cardiac Enzymes: No results for input(s): CKTOTAL, CKMB, CKMBINDEX, TROPONINI in the last 168 hours. BNP (last 3 results) No results for input(s): PROBNP in the last 8760 hours. HbA1C: No results for input(s): HGBA1C in the last 72 hours. CBG: Recent Labs  Lab 06/21/19 1958 06/22/19 0033 06/22/19 0358 06/22/19 0743 06/22/19 1200  GLUCAP 101* 93 104* 104* 111*   Lipid Profile: No results for input(s): CHOL, HDL, LDLCALC, TRIG, CHOLHDL, LDLDIRECT in the last 72 hours. Thyroid  Function Tests: No results for input(s): TSH, T4TOTAL, FREET4, T3FREE, THYROIDAB in the last 72 hours. Anemia Panel: No results for input(s): VITAMINB12, FOLATE, FERRITIN, TIBC, IRON, RETICCTPCT in the last 72 hours. Urine analysis:    Component Value Date/Time   COLORURINE AMBER (A) 06/21/2019 0916   APPEARANCEUR CLOUDY (A) 06/21/2019 0916   LABSPEC 1.020 06/21/2019 0916   PHURINE 5.0 06/21/2019 0916   GLUCOSEU NEGATIVE 06/21/2019 0916   HGBUR MODERATE (A) 06/21/2019 0916   BILIRUBINUR SMALL (A) 06/21/2019 0916   KETONESUR NEGATIVE 06/21/2019 0916   PROTEINUR 30 (A) 06/21/2019 0916   NITRITE NEGATIVE 06/21/2019 0916   LEUKOCYTESUR MODERATE (A) 06/21/2019 0916   Sepsis Labs: @LABRCNTIP (procalcitonin:4,lacticacidven:4)  ) Recent Results (from the past 240 hour(s))  Urine culture     Status: Abnormal   Collection Time: 06/18/19 11:05 PM   Specimen: Urine, Random  Result Value Ref Range Status   Specimen Description URINE, RANDOM  Final   Special Requests   Final    NONE Performed at [facility], [place] [address] [place],  [place] [address]    Culture >=100,000 COLONIES/mL KLEBSIELLA PNEUMONIAE (A)  Final   Report Status 06/21/2019 FINAL  Final   Organism ID, Bacteria KLEBSIELLA PNEUMONIAE (A)  Final      Susceptibility   Klebsiella pneumoniae - MIC*    AMPICILLIN RESISTANT Resistant     CEFAZOLIN <=4 SENSITIVE Sensitive     CEFTRIAXONE <=1 SENSITIVE Sensitive     CIPROFLOXACIN <=0.25 SENSITIVE Sensitive     GENTAMICIN <=1 SENSITIVE Sensitive     IMIPENEM <=0.25 SENSITIVE Sensitive     NITROFURANTOIN 32 SENSITIVE Sensitive     TRIMETH/SULFA <=20 SENSITIVE Sensitive     AMPICILLIN/SULBACTAM 4 SENSITIVE Sensitive     PIP/TAZO <=4 SENSITIVE Sensitive     Extended ESBL NEGATIVE Sensitive     * >=100,000 COLONIES/mL KLEBSIELLA PNEUMONIAE  SARS Coronavirus 2 by RT PCR (hospital order, performed in [facility] lab) Nasopharyngeal Nasopharyngeal Swab     Status: None   Collection Time: 06/18/19 11:25 PM   Specimen: Nasopharyngeal Swab  Result Value Ref Range Status   SARS Coronavirus 2 NEGATIVE NEGATIVE Final    Comment: (NOTE) If result is NEGATIVE SARS-CoV-2 target nucleic acids are NOT DETECTED. The SARS-CoV-2 RNA is generally detectable in upper and lower  respiratory specimens during the acute phase of infection. The lowest  concentration of SARS-CoV-2 viral copies this assay can detect is 250  copies / mL. A negative result does not preclude SARS-CoV-2 infection  and should not be used as the sole basis for treatment or other  patient management decisions.  A negative result may occur with  improper specimen collection / handling, submission of specimen other  than nasopharyngeal swab, presence of viral mutation(s) within the  areas targeted by this assay, and inadequate number of viral copies  (<250 copies / mL). A negative result must be combined with clinical  observations, patient history, and epidemiological information. If result is POSITIVE SARS-CoV-2 target nucleic acids are DETECTED. The  SARS-CoV-2 RNA is generally detectable in upper and lower  respiratory specimens dur ing the acute phase of infection.  Positive  results are indicative of active infection with SARS-CoV-2.  Clinical  correlation with patient history and other diagnostic information is  necessary to determine patient infection status.  Positive results do  not rule out bacterial infection or co-infection with other viruses. If result is PRESUMPTIVE POSTIVE SARS-CoV-2 nucleic acids MAY BE PRESENT.  A presumptive positive result was obtained on the submitted specimen  and confirmed on repeat testing.  While 2019 novel coronavirus  (SARS-CoV-2) nucleic acids may be present in the submitted sample  additional confirmatory testing may be necessary for epidemiological  and / or clinical management purposes  to differentiate between  SARS-CoV-2 and other Sarbecovirus currently known to infect humans.  If clinically indicated additional testing with an alternate test  methodology [phone]) is advised. The SARS-CoV-2 RNA is generally  detectable in upper and lower respiratory sp ecimens during the acute  phase of infection. The expected result is Negative. Fact Sheet for Patients:  StrictlyIdeas.no Fact Sheet for Healthcare Providers: BankingDealers.co.za This test is not yet approved or cleared by the [place] FDA and has been authorized for detection and/or diagnosis of SARS-CoV-2 by FDA under an Emergency Use Authorization (EUA).  This EUA will remain in effect (meaning this test can be used) for the duration of the COVID-19 declaration under Section 564(b)(1) of the Act, 21 U.S.C. section 360bbb-3(b)(1), unless the authorization is terminated or revoked sooner. Performed at [facility], [place] [address], [place], [place] [address]   Culture, blood (routine x 2)     Status: None (Preliminary result)   Collection Time: 06/19/19  1:17 AM   Specimen:  BLOOD LEFT HAND  Result Value Ref Range Status   Specimen Description BLOOD LEFT HAND  Final   Special Requests   Final    BOTTLES DRAWN AEROBIC AND ANAEROBIC Blood Culture results may not be optimal due to an inadequate volume of blood received in culture bottles   Culture   Final    NO GROWTH 3 DAYS Performed at [facility], [place] [address], [place], [place] [address]    Report Status PENDING  Incomplete  Culture, blood (routine x 2)     Status: None (Preliminary result)   Collection Time: 06/19/19  1:30 AM   Specimen: BLOOD RIGHT HAND  Result Value Ref Range Status   Specimen Description BLOOD RIGHT HAND  Final   Special Requests   Final    BOTTLES DRAWN AEROBIC AND ANAEROBIC Blood Culture results may not be optimal due to an inadequate volume of blood received in culture bottles   Culture   Final    NO GROWTH 3 DAYS Performed at [facility], [place] [address] [place], [place] [address]    Report Status PENDING  Incomplete         Radiology Studies: Ct Abdomen Pelvis Wo Contrast  Result Date: 06/20/2019 CLINICAL DATA:  Concern for high-grade small bowel obstruction EXAM: CT ABDOMEN AND PELVIS WITHOUT CONTRAST TECHNIQUE: Multidetector CT imaging of the abdomen and pelvis was performed following the standard protocol without IV contrast. COMPARISON:  CT abdomen pelvis dated 06/19/2019 FINDINGS: Lower chest: Moderate bilateral pleural effusions with associated atelectasis are partially imaged. The heart is enlarged. Hepatobiliary: Multiple small hypoattenuating lesions in the liver are not significantly changed since 11/23/2017. And likely represent cysts. The gallbladder is decompressed. The common bile duct measures up to 10 mm in diameter. No distal common bile duct stone is identified on today's exam. Pancreas: Unremarkable. No pancreatic ductal dilatation or surrounding inflammatory changes. Spleen: Normal in size without focal abnormality. Adrenals/Urinary Tract:  Adrenal glands are unremarkable. A 4 mm area of hyperdensity in the right kidney may represent a renal stone. Otherwise, the kidneys are normal, without renal calculi, focal lesion, or hydronephrosis. Bladder is decompressed with a Foley catheter. Stomach/Bowel: Stomach  is within normal limits. Enteric contrast reaches the transverse colon. There is colonic diverticulosis. Appendix appears normal. Multiple borderline dilated loops of small bowel are seen throughout the mid abdomen. No discrete transition point is identified. Vascular/Lymphatic: Aortic atherosclerosis. No enlarged abdominal or pelvic lymph nodes. Reproductive: Prostate is unremarkable. Other: Abdominal ascites and anasarca are redemonstrated. No abdominal wall hernia is identified. 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 Degenerative changes are seen in the lumbar spine. IMPRESSION: 1. Multiple borderline dilated loops of small bowel throughout the mid abdomen. No discrete transition point is identified. Findings may represent ileus or partial obstruction given that oral contrast reaches the transverse colon. 2. Moderate bilateral pleural effusions with associated atelectasis. 3. Abdominal ascites and anasarca. Aortic Atherosclerosis (ICD10-I70.0). Electronically Signed   By: Zerita Boers M.D.   On: 06/20/2019 21:34   US Renal  Result Date: 06/21/2019 CLINICAL DATA:  Acute renal failure on chronic kidney disease EXAM: RENAL / URINARY TRACT ULTRASOUND COMPLETE COMPARISON:  CT 06/20/2019 FINDINGS: Right Kidney: Renal measurements: 12.3 x 4.8 x 6.6 cm = volume: 205 mL. Diffusely increased echotexture. No mass or hydronephrosis. Left Kidney: Renal measurements: 12.0 x 5.8 x 6.1 cm = volume: 220 mL. Diffusely increased echotexture. No mass or hydronephrosis. Bladder: Decompressed with Foley catheter in place. Other: Moderate ascites in the abdomen and pelvis. IMPRESSION: Increased  echotexture within the kidneys compatible with chronic medical renal disease. No hydronephrosis. Moderate ascites. Electronically Signed   By: Rolm Baptise M.D.   On: 06/21/2019 11:12   [place] Ekg Site Rite  Result Date: 06/22/2019 If Site Rite image not attached, placement could not be confirmed due to current cardiac rhythm.       Scheduled Meds:  amoxicillin-clavulanate  1 tablet Oral Q12H   aspirin EC  81 mg Oral Daily   Chlorhexidine Gluconate Cloth  6 each Topical Daily   feeding supplement (ENSURE ENLIVE)  237 mL Oral BID BM   finasteride  5 mg Oral Daily   furosemide  80 mg Intravenous BID   insulin aspart  0-9 Units Subcutaneous Q4H   multivitamin with minerals  1 tablet Oral Daily   saccharomyces boulardii  250 mg Oral BID   sodium chloride flush  3 mL Intravenous Once   tamsulosin  0.4 mg Oral Daily   Continuous Infusions:  DOBUTamine     heparin 1,100 Units/hr (06/22/19 1140)     LOS: 3 days    Time spent: 35 minutes    Edwin Dada, MD [facility] 06/22/2019, 3:54 PM     Please page through [place]:  [email or web address] Password [email or web address] If 7PM-7AM, please contact night-coverage

## 2019-06-22 NOTE — Progress Notes (Signed)
Foley cath placed by urology at this time.

## 2019-06-22 NOTE — Progress Notes (Signed)
Peripherally Inserted Central Catheter/Midline Placement  The IV Nurse has discussed with the patient and/or persons authorized to consent for the patient, the purpose of this procedure and the potential benefits and risks involved with this procedure.  The benefits include less needle sticks, lab draws from the catheter, and the patient may be discharged home with the catheter. Risks include, but not limited to, infection, bleeding, blood clot (thrombus formation), and puncture of an artery; nerve damage and irregular heartbeat and possibility to perform a PICC exchange if needed/ordered by physician.  Alternatives to this procedure were also discussed.  Bard Power PICC patient education guide, fact sheet on infection prevention and patient information card has been provided to patient /or left at bedside.    PICC/Midline Placement Documentation  PICC Double Lumen 06/22/19 PICC Right Brachial 40 cm 0 cm (Active)  Indication for Insertion or Continuance of Line Vasoactive infusions 06/22/19 1553  Exposed Catheter (cm) 0 cm 06/22/19 1553  Site Assessment Clean;Dry;Intact 06/22/19 1553  Lumen #1 Status Flushed;Saline locked;Blood return noted 06/22/19 1553  Lumen #2 Status Flushed;Saline locked;Blood return noted 06/22/19 1553  Dressing Type Transparent 06/22/19 1553  Dressing Status Clean;Dry;Intact;Antimicrobial disc in place 06/22/19 1553  Dressing Change Due 06/29/19 06/22/19 1553       Gordan Payment 06/22/2019, 3:55 PM

## 2019-06-22 NOTE — Progress Notes (Signed)
Received order for PICC  

## 2019-06-22 NOTE — Progress Notes (Addendum)
Chart reviewed.  Patient having PICC line placed.  Called sister.  PMT meeting scheduled with Sister [name]) and patient's niece Leontine Locket) for 12:00 pm on 10/24.  Oris Drone will add the patient's children on by conference call.    I asked Oris Drone about code status.  She replied that he should be a DNR.  I will attempt to confirm this with the patient today if he is coherent.  Florentina Jenny, PA-C Palliative Medicine Pager: [phone]

## 2019-06-23 DIAGNOSIS — Z515 Encounter for palliative care: Secondary | ICD-10-CM

## 2019-06-23 DIAGNOSIS — I5023 Acute on chronic systolic (congestive) heart failure: Secondary | ICD-10-CM

## 2019-06-23 DIAGNOSIS — E119 Type 2 diabetes mellitus without complications: Secondary | ICD-10-CM

## 2019-06-23 DIAGNOSIS — N179 Acute kidney failure, unspecified: Secondary | ICD-10-CM | POA: Diagnosis not present

## 2019-06-23 DIAGNOSIS — K8050 Calculus of bile duct without cholangitis or cholecystitis without obstruction: Secondary | ICD-10-CM | POA: Diagnosis not present

## 2019-06-23 DIAGNOSIS — N170 Acute kidney failure with tubular necrosis: Secondary | ICD-10-CM

## 2019-06-23 DIAGNOSIS — K8031 Calculus of bile duct with cholangitis, unspecified, with obstruction: Secondary | ICD-10-CM | POA: Diagnosis not present

## 2019-06-23 DIAGNOSIS — R1310 Dysphagia, unspecified: Secondary | ICD-10-CM

## 2019-06-23 LAB — URINE CULTURE: Culture: NO GROWTH

## 2019-06-23 LAB — COMPREHENSIVE METABOLIC PANEL
ALT: 16 U/L (ref 0–44)
AST: 15 U/L (ref 15–41)
Albumin: 1.7 g/dL — ABNORMAL LOW (ref 3.5–5.0)
Alkaline Phosphatase: 42 U/L (ref 38–126)
Anion gap: 11 (ref 5–15)
BUN: 120 mg/dL — ABNORMAL HIGH (ref 8–23)
CO2: 23 mmol/L (ref 22–32)
Calcium: 8 mg/dL — ABNORMAL LOW (ref 8.9–10.3)
Chloride: 102 mmol/L (ref 98–111)
Creatinine, Ser: 5.16 mg/dL — ABNORMAL HIGH (ref 0.61–1.24)
GFR calc Af Amer: 12 mL/min — ABNORMAL LOW (ref >60)
GFR calc non Af Amer: 10 mL/min — ABNORMAL LOW (ref >60)
Glucose, Bld: 155 mg/dL — ABNORMAL HIGH (ref 70–99)
Potassium: 3.9 mmol/L (ref 3.5–5.1)
Sodium: 136 mmol/L (ref 135–145)
Total Bilirubin: 8.1 mg/dL — ABNORMAL HIGH (ref 0.3–1.2)
Total Protein: 6.4 g/dL — ABNORMAL LOW (ref 6.5–8.1)

## 2019-06-23 LAB — GLUCOSE, CAPILLARY
Glucose-Capillary: 134 mg/dL — ABNORMAL HIGH (ref 70–99)
Glucose-Capillary: 108 mg/dL — ABNORMAL HIGH (ref 70–99)
Glucose-Capillary: 146 mg/dL — ABNORMAL HIGH (ref 70–99)
Glucose-Capillary: 156 mg/dL — ABNORMAL HIGH (ref 70–99)
Glucose-Capillary: 129 mg/dL — ABNORMAL HIGH (ref 70–99)

## 2019-06-23 LAB — HEPARIN LEVEL (UNFRACTIONATED)
Heparin Unfractionated: 0.27 IU/mL — ABNORMAL LOW (ref 0.30–0.70)
Heparin Unfractionated: 0.27 IU/mL — ABNORMAL LOW (ref 0.30–0.70)

## 2019-06-23 LAB — COOXEMETRY PANEL
Carboxyhemoglobin: 2.3 % — ABNORMAL HIGH (ref 0.5–1.5)
Methemoglobin: 1.4 % (ref 0.0–1.5)
O2 Saturation: 64 %
Total hemoglobin: 10.2 g/dL — ABNORMAL LOW (ref 12.0–16.0)

## 2019-06-23 MED ORDER — CALCIUM POLYCARBOPHIL 625 MG PO TABS
625.0000 mg | ORAL_TABLET | Freq: Every day | ORAL | Status: DC
  Administered 2019-06-24 – 2019-07-09 (×16): 625 mg via ORAL
  Filled 2019-06-23 (×18): qty 1

## 2019-06-23 MED ORDER — FUROSEMIDE 10 MG/ML IJ SOLN
160.0000 mg | Freq: Three times a day (TID) | INTRAVENOUS | Status: DC
  Administered 2019-06-23 – 2019-06-24 (×5): 160 mg via INTRAVENOUS
  Filled 2019-06-23 (×8): qty 16

## 2019-06-23 NOTE — Progress Notes (Signed)
Patient noted with 3-4 soft/mushy bowel movements from 7am - 7pm.   Will continue to observe.

## 2019-06-23 NOTE — Consult Note (Signed)
Consultation Note Date: 06/23/2019   Patient Name: [name]  DOB: Dec 16, 1940  MRN: [id number]  Age / Sex: 78 y.o., male  PCP: [name], MD Referring Physician: Edwin Dada, *  Reason for Consultation: Establishing goals of care and Psychosocial/spiritual support  HPI/Patient Profile: 78 y.o. 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  He was also noted to have acute on chronic renal failure and hypotension.  He underwent ERCP with removal of stones.  Further cardiac evaluation revealed a worsening of his ejection fraction leading to low cardiac output.  He also has a thrombus on the left ventricle wall.  He was placed on dobutamine and his kidney function continues to worsen.   PMT was consulted for goals of care.  Clinical Assessment and Goals of Care:  I have reviewed medical records including EPIC notes, labs and imaging, received report from Dr. Loleta Books, assessed the patient and then met at the bedside along with his sister [name], niece [name], and his son [name] was added on by conference call  to discuss diagnosis prognosis, GOC, EOL wishes, disposition and options.  I introduced Palliative Medicine as specialized medical care for people living with serious illness. It focuses on providing relief from the symptoms and stress of a serious illness.   In my first meeting with [name] 10/23 we discussed a brief life review of the patient.  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  He was born and raised here in [place].  He lives alone.  His son is in [place].  His closest relative is his sister [name].  Today 10/24 We discussed his current  illness and what it means in the larger context of his on-going co-morbidities.  Natural disease trajectory and expectations at EOL were discussed.  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  This is not curable and will become slowly worse.  There are things that we can do to prolong his life, but the health problems can not be fixed.    [name] wanted to understand more about hemodialysis.  Dr. Loleta Books described short term CRRT and explained that the patient may not be an candidate for it - more discussions were needed with the nephrologist and cardiologist.  I attempted to elicit values and goals of care important to the patient.  He described wanting to see the sun light and eat what he wanted.  Last evening he talked with me about wanting to remain independent and not be tied to a machine or a facility.   With regard to medical interventions [name] felt that he would want them if there was a chance of him getting better overall.  Advanced directives, concepts specific to code status, artifical feeding and hydration, and rehospitalization were considered and discussed.  After much discussion it  was realized that being put on a ventilator for life support would not be beneficial and the patient agreed with being a DNR.  His sister and niece confirmed the DNR as well.  HCPOA paperwork was completed naming [name] (sister) as his HCPOA.  Questions and concerns were addressed.  The family was encouraged to call with questions or concerns.   Primary Decision Maker:  PATIENT.  His HCPOA if needed is his sister [name]    SUMMARY OF RECOMMENDATIONS     Code status changed to DNR  Introductory [place] meeting held.  Family is current on medical issues.  Patient wants interventions that will improve his overall health and prognosis.    PMT will follow with you as [name] progresses thru this hospitalization  Code Status/Advance Care Planning:  DNR   Symptom  Management:   Per primary team   Additional Recommendations (Limitations, Scope, Preferences):  Full Scope Treatment  Palliative Prophylaxis:   Aspiration  Psycho-social/Spiritual:   Desire for further Chaplaincy support: welcomed.  Requested to notarize HCPOA paperwork  Prognosis:   Given end stage mixed heart failure and acute on chronic renal failure as well as recent cholangitis likely weeks to months pending his medical choices and course.  Discharge Planning: To Be Determined      Primary Diagnoses: Present on Admission: . Abdominal pain . Essential hypertension . Hyperlipidemia . Acute lower UTI . CKD (chronic kidney disease), stage III ([place])   I have reviewed the medical record, interviewed the patient and family, and examined the patient. The following aspects are pertinent.  Past Medical History:  Diagnosis Date  . Acute cholecystitis 11/23/2017  . Acute respiratory failure with hypoxia ([place])   . Acute upper back pain   . AKI (acute kidney injury) ([place])   . Alcohol abuse 02/26/2015  . Anemia, deficiency   . Back pain    LUMBOSACRAL  . Bronchitis, complicated   . Cardiomegaly   . Chronic combined systolic and diastolic CHF (congestive heart failure) ([place])   . Chronic pain    HIGH RISK MED USE COMPREHENSIVE HIGH RISK  . CKD (chronic kidney disease), stage III   . Demand ischemia ([place])    a. minimally elevated troponin peak 0.11 in 2016, felt demand ischemia. Cath offered but pt wished to have done back in [place].  . Diabetes mellitus without complication ([place])   . Edema, leg   . Elevated troponin 02/26/2015  . Essential hypertension   . Fatigue   . Heart abnormality   . Hyperlipidemia   . Hypertension   . Neck pain, acute   . PVD (peripheral vascular disease) ([place])   . Renal lesion 11/23/2017  . SOB (shortness of breath) on exertion   . Solitary kidney   . Vitamin D deficiency    Social History   Socioeconomic History  . Marital status: Single     Spouse name: Not on file  . Number of children: Not on file  . Years of education: Not on file  . Highest education level: Not on file  Occupational History  . Not on file  Social Needs  . Financial resource strain: Not on file  . Food insecurity    Worry: Not on file    Inability: Not on file  . Transportation needs    Medical: Not on file    Non-medical: Not on file  Tobacco Use  . Smoking status: Former Smoker    Types: Cigarettes    Quit date: 02/26/2008  Years since quitting: 11.3  . Smokeless tobacco: Never Used  Substance and Sexual Activity  . Alcohol use: Yes    Comment: occ  . Drug use: No  . Sexual activity: Not on file  Lifestyle  . Physical activity    Days per week: Not on file    Minutes per session: Not on file  . Stress: Not on file  Relationships  . Social Herbalist on phone: Not on file    Gets together: Not on file    Attends religious service: Not on file    Active member of club or organization: Not on file    Attends meetings of clubs or organizations: Not on file    Relationship status: Not on file  Other Topics Concern  . Not on file  Social History Narrative  . Not on file   Family History  Problem Relation Age of Onset  . Other Mother        died in her sleep at 2  . Hypertension Sister   . Hyperlipidemia Sister   . Diabetes Mellitus II Sister   . Heart disease Sister   . CAD Neg Hx        neg hx premature CAD   Scheduled Meds: . amoxicillin-clavulanate  1 tablet Oral Q12H  . aspirin EC  81 mg Oral Daily  . Chlorhexidine Gluconate Cloth  6 each Topical Daily  . feeding supplement (ENSURE ENLIVE)  237 mL Oral BID BM  . finasteride  5 mg Oral Daily  . insulin aspart  0-9 Units Subcutaneous Q4H  . multivitamin with minerals  1 tablet Oral Daily  . polycarbophil  625 mg Oral Daily  . saccharomyces boulardii  250 mg Oral BID  . sodium chloride flush  10-40 mL Intracatheter Q12H  . sodium chloride flush  3 mL  Intravenous Once  . tamsulosin  0.4 mg Oral Daily   Continuous Infusions: . DOBUTamine 5 mcg/kg/min (06/22/19 2248)  . furosemide    . heparin 1,550 Units/hr (06/23/19 0644)   PRN Meds:.alum & mag hydroxide-simeth, ondansetron (ZOFRAN) IV, sodium chloride flush No Known Allergies Review of Systems weakness, fatigue, decreased appetite.  Physical Exam  Well developed male, awake, alert, coherent CV rrr resp no distress Abdomen obese, ND  Vital Signs: BP 120/71 (BP Location: Right Arm)   Pulse 73   Temp (!) 97.4 F (36.3 C) (Oral)   Resp 20   Ht 5' 11"  (1.803 m)   Wt 94.6 kg   SpO2 100%   BMI 29.08 kg/m  Pain Scale: 0-10   Pain Score: 3    SpO2: SpO2: 100 % O2 Device:SpO2: 100 % O2 Flow Rate: .O2 Flow Rate (L/min): 2 L/min  IO: Intake/output summary:   Intake/Output Summary (Last 24 hours) at 06/23/2019 1602 Last data filed at 06/23/2019 1535 Gross per 24 hour  Intake 519.29 ml  Output 1340 ml  Net -820.71 ml    LBM: Last BM Date: 06/22/19 Baseline Weight: Weight: 90 kg(from June 2020 records) Most recent weight: Weight: 94.6 kg     Palliative Assessment/Data: 30%     Time In: 12:00 Time Out: 1:10 Time Total: 70 min. Visit consisted of counseling and education dealing with the complex and emotionally intense issues surrounding the need for palliative care and symptom management in the setting of serious and potentially life-threatening illness. Greater than 50%  of this time was spent counseling and coordinating care related to the above assessment and plan.  Signed by: Florentina Jenny, PA-C Palliative Medicine Pager: [phone]  Please contact Palliative Medicine Team phone at [phone] for questions and concerns.  For individual provider: See Shea Evans

## 2019-06-23 NOTE — Progress Notes (Signed)
Chaplain aware of need for AD to be notarized. Chaplain will be honored to complete this task on Monday October 26th when notary is in the office. Chaplain remains available for support as needed.   Chaplain Resident, Evelene Croon, M Div Pager # [phone] on-call Pager # [phone] personal

## 2019-06-23 NOTE — Progress Notes (Signed)
Advanced Heart Failure Rounding Note   Subjective:    Now on IV dobutamine co-ox up 56-> 64%. Feels better but creatinien worse today. Up to 5.16. Sluggish urine output on IV lasix. Has seen Palliative Care and now DNR but otherwise wants aggressive care. CVP 15  Feels weak. Denies SOB, orthopnea or PND. On heparin without bleeding.   Objective:   Weight Range:  Vital Signs:   Temp:  [96.6 F (35.9 C)-97.5 F (36.4 C)] 97.4 F (36.3 C) (10/24 1252) Pulse Rate:  [59-76] 73 (10/24 1252) Resp:  [15-31] 20 (10/24 1252) BP: (102-120)/(56-71) 120/71 (10/24 1252) SpO2:  [96 %-100 %] 100 % (10/24 1252) Weight:  [94.6 kg] 94.6 kg (10/24 0319) Last BM Date: 06/22/19  Weight change: Filed Weights   06/19/19 0103 06/19/19 1128 06/23/19 0319  Weight: 90 kg 90 kg 94.6 kg    Intake/Output:   Intake/Output Summary (Last 24 hours) at 06/23/2019 1440 Last data filed at 06/23/2019 0956 Gross per 24 hour  Intake 419.29 ml  Output 1340 ml  Net -920.71 ml     Physical Exam: General:  Weal appearing. No resp difficulty HEENT: normal Neck: supple. JVP 15 . Carotids 2+ bilat; no bruits. No lymphadenopathy or thryomegaly appreciated. Cor: PMI nondisplaced. Regular rate & rhythm.+ PVCs Lungs: clear Abdomen: soft, nontender, + distended. No hepatosplenomegaly. No bruits or masses. Good bowel sounds. Extremities: no cyanosis, clubbing, rash, 2+ edema  + unna boots  Neuro: alert & orientedx3, cranial nerves grossly intact. moves all 4 extremities w/o difficulty. Affect pleasant  Telemetry: NSR 70-80s + PVC Personally reviewed   Labs: Basic Metabolic Panel: Recent Labs  Lab 06/19/19 1413 06/20/19 0616 06/21/19 0138 06/22/19 0401 06/23/19 0530  NA 137 137 137 137 136  K 4.1 4.2 4.3 4.0 3.9  CL 102 100 101 103 102  CO2 21* 22 23 23 23   GLUCOSE 118* 146* 140* 112* 155*  BUN 77* 90* 102* 113* 120*  CREATININE 3.26* 3.76* 4.09* 4.66* 5.16*  CALCIUM 8.4* 8.1* 8.0* 8.0* 8.0*  MG  2.1  --   --   --   --   PHOS 5.9*  --   --   --   --     Liver Function Tests: Recent Labs  Lab 06/19/19 0819 06/19/19 1413 06/20/19 0616 06/21/19 0138 06/22/19 0401 06/23/19 0530  AST 25  --  19 20 18 15   ALT 30  --  22 19 20 16   ALKPHOS 70  --  62 47 39 42  BILITOT 14.8*  --  12.8* 12.9* 10.6* 8.1*  PROT 6.9  --  6.2* 6.0* 6.3* 6.4*  ALBUMIN 2.2* 2.2* 1.8* 1.8* 1.8* 1.7*   Recent Labs  Lab 06/18/19 2054  LIPASE 220*   No results for input(s): AMMONIA in the last 168 hours.  CBC: Recent Labs  Lab 06/18/19 2054 06/19/19 0819 06/20/19 0616 06/21/19 0138 06/22/19 0401  WBC 9.9 9.2 10.5 10.7* 6.4  HGB 12.1* 11.7* 11.8* 10.4* 10.0*  HCT 36.3* 35.6* 33.7* 31.0* 28.3*  MCV 88.3 87.5 84.5 85.2 83.0  PLT 226 211 184 164 145*    Cardiac Enzymes: No results for input(s): CKTOTAL, CKMB, CKMBINDEX, TROPONINI in the last 168 hours.  BNP: BNP (last 3 results) Recent Labs    08/16/18 0940 02/04/19 0906 06/18/19 2109  BNP 1,774.3* >4,500.0* 2,536.0*    ProBNP (last 3 results) No results for input(s): PROBNP in the last 8760 hours.    Other results:  Imaging:  [place] Ekg Site Rite  Result Date: 06/22/2019 If Site Rite image not attached, placement could not be confirmed due to current cardiac rhythm.     Medications:     Scheduled Medications: . amoxicillin-clavulanate  1 tablet Oral Q12H  . aspirin EC  81 mg Oral Daily  . Chlorhexidine Gluconate Cloth  6 each Topical Daily  . feeding supplement (ENSURE ENLIVE)  237 mL Oral BID BM  . finasteride  5 mg Oral Daily  . furosemide  80 mg Intravenous BID  . insulin aspart  0-9 Units Subcutaneous Q4H  . multivitamin with minerals  1 tablet Oral Daily  . polycarbophil  625 mg Oral Daily  . saccharomyces boulardii  250 mg Oral BID  . sodium chloride flush  10-40 mL Intracatheter Q12H  . sodium chloride flush  3 mL Intravenous Once  . tamsulosin  0.4 mg Oral Daily     Infusions: . DOBUTamine 5 mcg/kg/min  (06/22/19 2248)  . heparin 1,550 Units/hr (06/23/19 0644)     PRN Medications:  alum & mag hydroxide-simeth, ondansetron (ZOFRAN) IV, sodium chloride flush   Assessment/Plan:    1. Acute/chronic systolic CHF: Concern for low output HF with cardiorenal syndrome with progressive worsening of renal function, creatinine now 4.66.  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  He does, of note, have anterior Qs on ECG.  Echo this admission showed EF < 20%, severe LV dilation, small LV apical thrombus, severely dilated and dysfunctional RV with flattened septum. At this time, SBP generally in 90s. He is markedly volume overloaded on exam. Dobutamine started on 10/23. Co-ox 56%-> 64%. Feeling better  - Poor response to IV lasix with rising creatinine - CVP 15. Will increase lasix to 160 q8 - Unna boots 2. AKI: On CKD stage 3.  Initial creatinine 2.8, now up to 5.16.  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  Baseline creatinine was 1.5 in 1/20 and 2.5 in 6/20 - Support cardiac output with dobutamine.  - Hopefully we will have some fall in creatinine as renal venous pressure drops, but I am concerned that we may not be successful.  Dialysis would be very difficult for him with severe biventricular failure.  - Renal has seen. Patient initially refused short-term HD but now seems to be willing to consider. He is not candidate for long-term HD and given severe RV function likely not a candidate for VAD so long-term options limited to palliative inotropes or hospice so not sure if offering even short-term HD is reasonable 3. 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  - No bleeding. Discussed dosing with PharmD personally. 4. Ascending cholangitis: S/p ERCP and stone removal.  Now afebrile with normal WBCs.  - Continue Augmentin per primary service.  5. Hyperbilirubinemia: Likely due to combination of CBD obstruction (now relieved) and RV failure with hepatic  congestion.  6. GOC - He had meeting with Palliative Care and Dr. Loleta Books today. He is now DNR but would like aggressive care up to and including VAD support if he would qualify but CKD and RV failure likely prohibit VAD and thus long-term options limited to hospice vs palliative home inotropes.   CRITICAL CARE Performed by: Glori Bickers  Total critical care time: 35 minutes  Critical care time was exclusive of separately billable procedures and treating other patients.  Critical care was necessary to treat or prevent imminent or life-threatening deterioration.  Critical care was time spent personally by  me (independent of midlevel providers or residents) on the following activities: development of treatment plan with patient and/or surrogate as well as nursing, discussions with consultants, evaluation of patient's response to treatment, examination of patient, obtaining history from patient or surrogate, ordering and performing treatments and interventions, ordering and review of laboratory studies, ordering and review of radiographic studies, pulse oximetry and re-evaluation of patient's condition.    Length of Stay: 4   Glori Bickers MD 06/23/2019, 2:40 PM  Advanced Heart Failure Team Pager [phone] (M-F; [place])  Please contact [facility] for night-coverage after hours (4p -7a ) and weekends on [email or web address]

## 2019-06-23 NOTE — Progress Notes (Signed)
ANTICOAGULATION CONSULT NOTE  Pharmacy Consult for Heparin Indication: LV thrombus  No Known Allergies  Patient Measurements: Height: 5\' 11"  (180.3 cm) Weight: 208 lb 8 oz (94.6 kg) IBW/kg (Calculated) : 75.3  Heparin dosing weight: 90 kg  Vital Signs: Temp: 97.5 F (36.4 C) (10/24 0319) Temp Source: Oral (10/24 0319) BP: 111/61 (10/24 0319) Pulse Rate: 76 (10/24 0345)  Labs: Recent Labs    06/21/19 0138 06/22/19 0401 06/22/19 2030 06/23/19 0530  HGB 10.4* 10.0*  --   --   HCT 31.0* 28.3*  --   --   PLT 164 145*  --   --   HEPARINUNFRC  --   --  <0.10* 0.27*  CREATININE 4.09* 4.66*  --  5.16*    Estimated Creatinine Clearance: 14.5 mL/min (A) (by C-G formula based on SCr of 5.16 mg/dL (H)).  Assessment: 78 y.o. male with LV thrombus for heparin  Goal of Therapy:  Heparin level 0.3-0.7 units/ml Monitor platelets by anticoagulation protocol: Yes   Plan:  Increase Heparin 1550 units/hr  Phillis Knack, PharmD, BCPS  06/23/2019,6:40 AM

## 2019-06-23 NOTE — Progress Notes (Addendum)
PROGRESS NOTE    [name]  ZOX:[id number] DOB: December 24, 1940 DOA: 06/18/2019 PCP: [name], MD      Brief Narrative:  [name] is a 78 y.o. M with HTN, sCHF EF 25%, CKD III baseline Cr 2 and alcohol use who presented with 4-5 days progressive abdominal pain with vomiting.  In the ER, US abdomen showed gallstones, was admitted for possible cholecystitis.        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  Patient initially started on Lasix, Cardiology consulted.  Subsequently, diuretics held, started on fluids.  They echocardiogram obtained which showed EF 15%, signs of decompensated biventricular failure.  Dobutamine started late yesterday, Lasix after.  Already 1000 mL UOP today.  Svo2 today 65 up from 50.   -hold coreg, entresto  -Appreciate CHF management -defer lasix / inotrope's to CHF service, remains on dobutamine 5 mcg -continue [employer]  -ASA 81 mg QD -daily weights, CVPs, Svo2s -Continue heparin gtt   Acute renal failure on CKD stage III Baseline sr cr ~ 2, Cr continuing to trend up >5.16 today.  Renal US unremarkable.  FenA implies underperfusion, c/w cardiogenic shock. -Dobutamine per Cardiology  -appreciate Nephrology expertise   GOALS OF CARE: Met with patient, sister/POA, daughter, and son (by phone) today in room.  Palliative Care facilitated discussion of current state, goals of care.  Patient able to articulate his goal is to pursue treatments that will improve his function to be able to go home, enjoy home, food, time outside, family.   -He wants continued dobutamine, lasix, heparin, frequent labs and monitoring.   -Would be willing to undergo CVVH or temporary dialysis -Outpatient inotrope and LVAD were not discussed, although I suspect he would consider this -Intermittent HD was described to him as not an option -Would not want CPR,  ventilation support in CODE scenario    Acute choledocholithiasis and cholangitis Afebrile, no leukocytosis.  Blood cultures pending. LFts up, doubt much residual infection or sepsis, suspect element of congestion in setting of decompensated CHF.   -continue augmentin, day 5 of 10 -if develops fever, RUQ pain would probably pursue percutaneous drain    Diarrhea This has accelerated in last 2-3 days.  Hope this is simply abx-assoc diarrhea or related to bowel edema 2/2 CHF.  Certainly have alternative explanation for ARF, and there is no high WBC to suggest Cdiff.   -continue florastor -add fiber  -follow WBC / fever curve      Ileus CT obtained due to patient abdominal discomfort, distension and nausea showed mild ileus.  Suspect this was from #1 above. Seems now resolved, tolerating oral diet  Diabetes Glucoses well controlled -Continue SS correction insulin    BPH / Urinary Retention  Chronic indwelling foley  -Continue finasteride, flomax       DVT prophylaxis: SCDs Code Status: Full code Family Communication: Patient, sisters, son updated at bedside.     Consultants:   Cardiology  Gastroenterology  Nephrology  Palliative care   Procedures:   10/20 ERCP -- stones, purulent cholangitis  10/21 CT abdomen -- mild partial ileus  10/22 US renal  10/23 echo LVEF 10-15%.  Dilated LV.  RV moderately reduced systolic function and dilated.  IV septum flattened in diastole indicating volume overload.  L pleural effusion.  Apical thrombus.    10/23 PICC placement  Antimicrobials:   Zosyn 10/19 >> 10/22  Augmentin 10/23 >>  Culture data:   10/19 COVID >> negative   10/19 urine culture -- Klebsiella, unasyn/zosyn sensistive  10/20 blood culture x2 -- pending / NGTD >>  10/22 urine culture >> klebsiella pneumoniae >> S-ceftriaxone, bactrim, zosyn  10/23 urine culture >> negative     Subjective: Pt reports multiple episodes of diarrhea - RN  reports 4 this am (not recorded). States every time he eats, he has diarrhea almost immediately after.  Still swollen, has some groin pain.  Denies pain, SOB.  Afebrile. I/O - 340 UOP on 10/23, 1L out 10/24.     Objective: Vitals:   06/23/19 0319 06/23/19 0345 06/23/19 0817 06/23/19 1252  BP: 111/61  118/67 120/71  Pulse: 73 76 71 73  Resp: (!) 23 (!) 23 (!) 23 20  Temp: (!) 97.5 F (36.4 C)  (!) 97.5 F (36.4 C) (!) 97.4 F (36.3 C)  TempSrc: Oral  Oral Oral  SpO2: 99% 98% 99% 100%  Weight: 94.6 kg     Height:        Intake/Output Summary (Last 24 hours) at 06/23/2019 1444 Last data filed at 06/23/2019 0956 Gross per 24 hour  Intake 419.29 ml  Output 1340 ml  Net -920.71 ml   Filed Weights   06/19/19 0103 06/19/19 1128 06/23/19 0319  Weight: 90 kg 90 kg 94.6 kg    Examination: General appearance: Adult male, lying in bed, no acute distress, interactive    HEENT: mucus membranes moist / pink, poor dentition, sclera anicteric, no nasal deformity or discharge.   Skin:  Warm/dry, no rashes or lesions  Cardiac: Gallop noted, 2+ edema in the legs, Unna boots in place, JVP elevated Respiratory: Respirations appear normal, dyspneic with exertion, lung sounds diminished throughout, do not actually appreciate rales. Abdomen: Abdomen soft, slightly distended, bowel sounds active, tenderness, diffusely MSK: Normal muscle bulk and tone Neuro: Awake and alert, moves all extremities with weak strength but symmetric coordination, speech fluent, extraocular movements appear grossly intact.   Psych: Attention normal, affect blunted, judgment and insight appear normal.     Data Reviewed: I have personally reviewed following labs and imaging studies:  CBC: Recent Labs  Lab 06/18/19 2054 06/19/19 0819 06/20/19 0616 06/21/19 0138 06/22/19 0401  WBC 9.9 9.2 10.5 10.7* 6.4  HGB 12.1* 11.7* 11.8* 10.4* 10.0*  HCT 36.3* 35.6* 33.7* 31.0* 28.3*  MCV 88.3 87.5 84.5 85.2 83.0  PLT 226  211 184 164 403*   Basic Metabolic Panel: Recent Labs  Lab 06/19/19 1413 06/20/19 0616 06/21/19 0138 06/22/19 0401 06/23/19 0530  NA 137 137 137 137 136  K 4.1 4.2 4.3 4.0 3.9  CL 102 100 101 103 102  CO2 21* 22 23 23 23   GLUCOSE 118* 146* 140* 112* 155*  BUN 77* 90* 102* 113* 120*  CREATININE 3.26* 3.76* 4.09* 4.66* 5.16*  CALCIUM 8.4* 8.1* 8.0* 8.0* 8.0*  MG 2.1  --   --   --   --   PHOS 5.9*  --   --   --   --    GFR: Estimated Creatinine Clearance: 14.5 mL/min (A) (by C-G formula based on SCr of 5.16 mg/dL (H)). Liver Function Tests: Recent Labs  Lab 06/19/19 0819 06/19/19 1413 06/20/19 0616 06/21/19 0138 06/22/19 0401 06/23/19 0530  AST 25  --  19 20 18 15   ALT 30  --  22 19 20 16   ALKPHOS 70  --  62 47 39 42  BILITOT 14.8*  --  12.8* 12.9* 10.6*  8.1*  PROT 6.9  --  6.2* 6.0* 6.3* 6.4*  ALBUMIN 2.2* 2.2* 1.8* 1.8* 1.8* 1.7*   Recent Labs  Lab 06/18/19 2054  LIPASE 220*   No results for input(s): AMMONIA in the last 168 hours. Coagulation Profile: Recent Labs  Lab 06/19/19 0036  INR 1.5*   Cardiac Enzymes: No results for input(s): CKTOTAL, CKMB, CKMBINDEX, TROPONINI in the last 168 hours. BNP (last 3 results) No results for input(s): PROBNP in the last 8760 hours. HbA1C: No results for input(s): HGBA1C in the last 72 hours. CBG: Recent Labs  Lab 06/22/19 2012 06/22/19 2345 06/23/19 0404 06/23/19 0816 06/23/19 1256  GLUCAP 165* 153* 134* 108* 146*   Lipid Profile: No results for input(s): CHOL, HDL, LDLCALC, TRIG, CHOLHDL, LDLDIRECT in the last 72 hours. Thyroid Function Tests: No results for input(s): TSH, T4TOTAL, FREET4, T3FREE, THYROIDAB in the last 72 hours. Anemia Panel: No results for input(s): VITAMINB12, FOLATE, FERRITIN, TIBC, IRON, RETICCTPCT in the last 72 hours. Urine analysis:    Component Value Date/Time   COLORURINE AMBER (A) 06/21/2019 0916   APPEARANCEUR CLOUDY (A) 06/21/2019 0916   LABSPEC 1.020 06/21/2019 0916    PHURINE 5.0 06/21/2019 0916   GLUCOSEU NEGATIVE 06/21/2019 0916   HGBUR MODERATE (A) 06/21/2019 0916   BILIRUBINUR SMALL (A) 06/21/2019 0916   KETONESUR NEGATIVE 06/21/2019 0916   PROTEINUR 30 (A) 06/21/2019 0916   NITRITE NEGATIVE 06/21/2019 0916   LEUKOCYTESUR MODERATE (A) 06/21/2019 0916   Sepsis Labs: @LABRCNTIP (procalcitonin:4,lacticacidven:4)  ) Recent Results (from the past 240 hour(s))  Urine culture     Status: Abnormal   Collection Time: 06/18/19 11:05 PM   Specimen: Urine, Random  Result Value Ref Range Status   Specimen Description URINE, RANDOM  Final   Special Requests   Final    NONE Performed at [facility], [place] [address] [place], [place] [address]    Culture >=100,000 COLONIES/mL KLEBSIELLA PNEUMONIAE (A)  Final   Report Status 06/21/2019 FINAL  Final   Organism ID, Bacteria KLEBSIELLA PNEUMONIAE (A)  Final      Susceptibility   Klebsiella pneumoniae - MIC*    AMPICILLIN RESISTANT Resistant     CEFAZOLIN <=4 SENSITIVE Sensitive     CEFTRIAXONE <=1 SENSITIVE Sensitive     CIPROFLOXACIN <=0.25 SENSITIVE Sensitive     GENTAMICIN <=1 SENSITIVE Sensitive     IMIPENEM <=0.25 SENSITIVE Sensitive     NITROFURANTOIN 32 SENSITIVE Sensitive     TRIMETH/SULFA <=20 SENSITIVE Sensitive     AMPICILLIN/SULBACTAM 4 SENSITIVE Sensitive     PIP/TAZO <=4 SENSITIVE Sensitive     Extended ESBL NEGATIVE Sensitive     * >=100,000 COLONIES/mL KLEBSIELLA PNEUMONIAE  SARS Coronavirus 2 by RT PCR (hospital order, performed in [facility] hospital lab) Nasopharyngeal Nasopharyngeal Swab     Status: None   Collection Time: 06/18/19 11:25 PM   Specimen: Nasopharyngeal Swab  Result Value Ref Range Status   SARS Coronavirus 2 NEGATIVE NEGATIVE Final    Comment: (NOTE) If result is NEGATIVE SARS-CoV-2 target nucleic acids are NOT DETECTED. The SARS-CoV-2 RNA is generally detectable in upper and lower  respiratory specimens during the acute phase of infection. The lowest   concentration of SARS-CoV-2 viral copies this assay can detect is 250  copies / mL. A negative result does not preclude SARS-CoV-2 infection  and should not be used as the sole basis for treatment or other  patient management decisions.  A negative result may occur with  improper specimen  collection / handling, submission of specimen other  than nasopharyngeal swab, presence of viral mutation(s) within the  areas targeted by this assay, and inadequate number of viral copies  (<250 copies / mL). A negative result must be combined with clinical  observations, patient history, and epidemiological information. If result is POSITIVE SARS-CoV-2 target nucleic acids are DETECTED. The SARS-CoV-2 RNA is generally detectable in upper and lower  respiratory specimens dur ing the acute phase of infection.  Positive  results are indicative of active infection with SARS-CoV-2.  Clinical  correlation with patient history and other diagnostic information is  necessary to determine patient infection status.  Positive results do  not rule out bacterial infection or co-infection with other viruses. If result is PRESUMPTIVE POSTIVE SARS-CoV-2 nucleic acids MAY BE PRESENT.   A presumptive positive result was obtained on the submitted specimen  and confirmed on repeat testing.  While 2019 novel coronavirus  (SARS-CoV-2) nucleic acids may be present in the submitted sample  additional confirmatory testing may be necessary for epidemiological  and / or clinical management purposes  to differentiate between  SARS-CoV-2 and other Sarbecovirus currently known to infect humans.  If clinically indicated additional testing with an alternate test  methodology [phone]) is advised. The SARS-CoV-2 RNA is generally  detectable in upper and lower respiratory sp ecimens during the acute  phase of infection. The expected result is Negative. Fact Sheet for Patients:  StrictlyIdeas.no Fact Sheet  for Healthcare Providers: BankingDealers.co.za This test is not yet approved or cleared by the [place] FDA and has been authorized for detection and/or diagnosis of SARS-CoV-2 by FDA under an Emergency Use Authorization (EUA).  This EUA will remain in effect (meaning this test can be used) for the duration of the COVID-19 declaration under Section 564(b)(1) of the Act, 21 U.S.C. section 360bbb-3(b)(1), unless the authorization is terminated or revoked sooner. Performed at [facility], [place] [address] [place], [place] [address]   Culture, blood (routine x 2)     Status: None (Preliminary result)   Collection Time: 06/19/19  1:17 AM   Specimen: BLOOD LEFT HAND  Result Value Ref Range Status   Specimen Description BLOOD LEFT HAND  Final   Special Requests   Final    BOTTLES DRAWN AEROBIC AND ANAEROBIC Blood Culture results may not be optimal due to an inadequate volume of blood received in culture bottles   Culture   Final    NO GROWTH 4 DAYS Performed at [facility], [place] [address] [place], [place] [address]    Report Status PENDING  Incomplete  Culture, blood (routine x 2)     Status: None (Preliminary result)   Collection Time: 06/19/19  1:30 AM   Specimen: BLOOD RIGHT HAND  Result Value Ref Range Status   Specimen Description BLOOD RIGHT HAND  Final   Special Requests   Final    BOTTLES DRAWN AEROBIC AND ANAEROBIC Blood Culture results may not be optimal due to an inadequate volume of blood received in culture bottles   Culture   Final    NO GROWTH 4 DAYS Performed at [facility], [place] [address] [place],  [address]    Report Status PENDING  Incomplete  Culture, Urine     Status: None   Collection Time: 06/22/19  8:16 AM   Specimen: Urine, Catheterized  Result Value Ref Range Status   Specimen Description URINE, CATHETERIZED  Final   Special Requests NONE  Final  Culture   Final    NO GROWTH Performed at [facility], [place] [address] [place], [place] [address]    Report Status 06/23/2019 FINAL  Final         Radiology Studies: [place] Ekg Site Rite  Result Date: 06/22/2019 If Site Rite image not attached, placement could not be confirmed due to current cardiac rhythm.       Scheduled Meds: . amoxicillin-clavulanate  1 tablet Oral Q12H  . aspirin EC  81 mg Oral Daily  . Chlorhexidine Gluconate Cloth  6 each Topical Daily  . feeding supplement (ENSURE ENLIVE)  237 mL Oral BID BM  . finasteride  5 mg Oral Daily  . furosemide  80 mg Intravenous BID  . insulin aspart  0-9 Units Subcutaneous Q4H  . multivitamin with minerals  1 tablet Oral Daily  . polycarbophil  625 mg Oral Daily  . saccharomyces boulardii  250 mg Oral BID  . sodium chloride flush  10-40 mL Intracatheter Q12H  . sodium chloride flush  3 mL Intravenous Once  . tamsulosin  0.4 mg Oral Daily   Continuous Infusions: . DOBUTamine 5 mcg/kg/min (06/22/19 2248)  . heparin 1,550 Units/hr (06/23/19 0644)     LOS: 4 days    Time spent: 81 minutes    Noe Gens, AG-ACNP-S  [employer]   [facility] 06/23/2019, 2:44 PM     Please page through [place]:  [email or web address] If 7PM-7AM, please contact night-coverage       Attending MD Note:  I have seen and examined the patient with nurse practitioner/physician assistant and agree with the note above which has been edited to reflect our agreed upon history, exam, and assessment/plan.   I have personally reviewed the orders for the patient, which were made under my direction.        [place]

## 2019-06-23 NOTE — Progress Notes (Signed)
[facility]    NEPHROLOGY PROGRESS NOTE  SUBJECTIVE: Patient seen and examined.  Reports feeling generally well.  Denies any appetite disturbance.  Denies chest pain, shortness of breath, nausea or vomiting.  All other review of systems are negative.    OBJECTIVE:  Vitals:   06/23/19 0817 06/23/19 1252  BP: 118/67 120/71  Pulse: 71 73  Resp: (!) 23 20  Temp: (!) 97.5 F (36.4 C) (!) 97.4 F (36.3 C)  SpO2: 99% 100%    Intake/Output Summary (Last 24 hours) at 06/23/2019 1527 Last data filed at 06/23/2019 5916 Gross per 24 hour  Intake 419.29 ml  Output 1340 ml  Net -920.71 ml      General:  AAOx3 NAD HEENT: MMM [place] AT anicteric sclera Neck: Minimal JVD, no adenopathy CV:  Heart RRR  Lungs:  L/S CTA bilaterally Abd:  abd SNT/ND with normal BS GU:  Bladder non-palpable Extremities: 2+ bilateral lower extremity edema skin:  No skin rash  MEDICATIONS:  . amoxicillin-clavulanate  1 tablet Oral Q12H  . aspirin EC  81 mg Oral Daily  . Chlorhexidine Gluconate Cloth  6 each Topical Daily  . feeding supplement (ENSURE ENLIVE)  237 mL Oral BID BM  . finasteride  5 mg Oral Daily  . insulin aspart  0-9 Units Subcutaneous Q4H  . multivitamin with minerals  1 tablet Oral Daily  . polycarbophil  625 mg Oral Daily  . saccharomyces boulardii  250 mg Oral BID  . sodium chloride flush  10-40 mL Intracatheter Q12H  . sodium chloride flush  3 mL Intravenous Once  . tamsulosin  0.4 mg Oral Daily       LABS:   CBC Latest Ref Rng & Units 06/22/2019 06/21/2019 06/20/2019  WBC 4.0 - 10.5 K/uL 6.4 10.7(H) 10.5  Hemoglobin 13.0 - 17.0 g/dL 10.0(L) 10.4(L) 11.8(L)  Hematocrit 39.0 - 52.0 % 28.3(L) 31.0(L) 33.7(L)  Platelets 150 - 400 K/uL 145(L) 164 184    CMP Latest Ref Rng & Units 06/23/2019 06/22/2019 06/21/2019  Glucose 70 - 99 mg/dL 155(H) 112(H) 140(H)  BUN 8 - 23 mg/dL 120(H) 113(H) 102(H)  Creatinine 0.61 - 1.24 mg/dL 5.16(H) 4.66(H) 4.09(H)  Sodium 135 -  145 mmol/L 136 137 137  Potassium 3.5 - 5.1 mmol/L 3.9 4.0 4.3  Chloride 98 - 111 mmol/L 102 103 101  CO2 22 - 32 mmol/L 23 23 23   Calcium 8.9 - 10.3 mg/dL 8.0(L) 8.0(L) 8.0(L)  Total Protein 6.5 - 8.1 g/dL 6.4(L) 6.3(L) 6.0(L)  Total Bilirubin 0.3 - 1.2 mg/dL 8.1(H) 10.6(H) 12.9(H)  Alkaline Phos 38 - 126 U/L 42 39 47  AST 15 - 41 U/L 15 18 20   ALT 0 - 44 U/L 16 20 19     Lab Results  Component Value Date   CALCIUM 8.0 (L) 06/23/2019   CAION 1.14 (L) 09/13/2018   PHOS 5.9 (H) 06/19/2019       Component Value Date/Time   COLORURINE AMBER (A) 06/21/2019 0916   APPEARANCEUR CLOUDY (A) 06/21/2019 0916   LABSPEC 1.020 06/21/2019 0916   PHURINE 5.0 06/21/2019 0916   GLUCOSEU NEGATIVE 06/21/2019 0916   HGBUR MODERATE (A) 06/21/2019 0916   BILIRUBINUR SMALL (A) 06/21/2019 0916   KETONESUR NEGATIVE 06/21/2019 0916   PROTEINUR 30 (A) 06/21/2019 0916   NITRITE NEGATIVE 06/21/2019 0916   LEUKOCYTESUR MODERATE (A) 06/21/2019 0916      Component Value Date/Time   PHART 7.196 (LL) 02/26/2015 0208   PCO2ART 58.7 ([place]) 02/26/2015 3846  PO2ART 280.0 (H) 02/26/2015 0208   HCO3 22.7 02/26/2015 0208   TCO2 25 09/13/2018 0604   ACIDBASEDEF 6.0 (H) 02/26/2015 0208   O2SAT 64.0 06/23/2019 0430    No results found for: IRON, TIBC, FERRITIN, IRONPCTSAT     ASSESSMENT/PLAN:     1. CKD stage III-IV.  Baseline serum creatinine around 2-2.2  2. Acute kidney injury.  Likely secondary to hemodynamics.  He has had some urine sediment but overall picture is most consistent with ATN.  No indication for urgent dialysis.  Continue to monitor.  Urine output borderline but steady.  Patient has not wanted dialysis but now is telling other physicians that he may be agreeable if needed.  Carvedilol held due to low blood pressures.  Renal ultrasound with increased echogenicity but no obstruction.  Doubt AIN related to Zosyn due to short timeframe of exposure. 3.  CHF - continue dobutamine and Lasix.  Appreciate  cardiology input.  Hopefully can back off on diuresis tomorrow. 4.  DM 5.  BPH with chronic indwelling foley; just changed out Monday. 6.  Abd pain secondary to choledocholithiasis and cholangitis, s/p stone removal.      Gaylene Brooks, DO, FACP

## 2019-06-23 NOTE — Progress Notes (Signed)
ANTICOAGULATION CONSULT NOTE  Pharmacy Consult for Heparin Indication: LV thrombus  No Known Allergies  Patient Measurements: Height: 5\' 11"  (180.3 cm) Weight: 208 lb 8 oz (94.6 kg) IBW/kg (Calculated) : 75.3  Heparin dosing weight: 90 kg  Vital Signs: Temp: 97.4 F (36.3 C) (10/24 1252) Temp Source: Oral (10/24 1252) BP: 120/71 (10/24 1252) Pulse Rate: 73 (10/24 1252)  Labs: Recent Labs    06/21/19 0138 06/22/19 0401 06/22/19 2030 06/23/19 0530 06/23/19 1434  HGB 10.4* 10.0*  --   --   --   HCT 31.0* 28.3*  --   --   --   PLT 164 145*  --   --   --   HEPARINUNFRC  --   --  <0.10* 0.27* 0.27*  CREATININE 4.09* 4.66*  --  5.16*  --     Estimated Creatinine Clearance: 14.5 mL/min (A) (by C-G formula based on SCr of 5.16 mg/dL (H)).  Assessment: 78 y.o. male with LV thrombus on heparin -heparin level still 0.27 after increase to 1550 units/hr  Goal of Therapy:  Heparin level 0.3-0.7 units/ml Monitor platelets by anticoagulation protocol: Yes   Plan:  Increase Heparin to 1700 units/ht Daily heparin level and CBC  Hildred Laser, PharmD Clinical Pharmacist **Pharmacist phone directory can now be found on [email or web address] ([email or web address]).  Listed under [place].

## 2019-06-24 DIAGNOSIS — K8031 Calculus of bile duct with cholangitis, unspecified, with obstruction: Secondary | ICD-10-CM | POA: Diagnosis not present

## 2019-06-24 DIAGNOSIS — N1832 Chronic kidney disease, stage 3b: Secondary | ICD-10-CM | POA: Diagnosis not present

## 2019-06-24 DIAGNOSIS — K8050 Calculus of bile duct without cholangitis or cholecystitis without obstruction: Secondary | ICD-10-CM | POA: Diagnosis not present

## 2019-06-24 DIAGNOSIS — N179 Acute kidney failure, unspecified: Secondary | ICD-10-CM | POA: Diagnosis not present

## 2019-06-24 LAB — COMPREHENSIVE METABOLIC PANEL
ALT: 15 U/L (ref 0–44)
AST: 16 U/L (ref 15–41)
Albumin: 1.7 g/dL — ABNORMAL LOW (ref 3.5–5.0)
Alkaline Phosphatase: 45 U/L (ref 38–126)
Anion gap: 12 (ref 5–15)
BUN: 115 mg/dL — ABNORMAL HIGH (ref 8–23)
CO2: 23 mmol/L (ref 22–32)
Calcium: 8 mg/dL — ABNORMAL LOW (ref 8.9–10.3)
Chloride: 100 mmol/L (ref 98–111)
Creatinine, Ser: 4.85 mg/dL — ABNORMAL HIGH (ref 0.61–1.24)
GFR calc Af Amer: 13 mL/min — ABNORMAL LOW (ref >60)
GFR calc non Af Amer: 11 mL/min — ABNORMAL LOW (ref >60)
Glucose, Bld: 90 mg/dL (ref 70–99)
Potassium: 3.5 mmol/L (ref 3.5–5.1)
Sodium: 135 mmol/L (ref 135–145)
Total Bilirubin: 6.7 mg/dL — ABNORMAL HIGH (ref 0.3–1.2)
Total Protein: 6 g/dL — ABNORMAL LOW (ref 6.5–8.1)

## 2019-06-24 LAB — CULTURE, BLOOD (ROUTINE X 2)
Culture: NO GROWTH
Culture: NO GROWTH

## 2019-06-24 LAB — CBC
HCT: 28.3 % — ABNORMAL LOW (ref 39.0–52.0)
Hemoglobin: 9.7 g/dL — ABNORMAL LOW (ref 13.0–17.0)
MCH: 28.7 pg (ref 26.0–34.0)
MCHC: 34.3 g/dL (ref 30.0–36.0)
MCV: 83.7 fL (ref 80.0–100.0)
Platelets: 180 10*3/uL (ref 150–400)
RBC: 3.38 MIL/uL — ABNORMAL LOW (ref 4.22–5.81)
RDW: 17.6 % — ABNORMAL HIGH (ref 11.5–15.5)
WBC: 5.7 10*3/uL (ref 4.0–10.5)
nRBC: 0.7 % — ABNORMAL HIGH (ref 0.0–0.2)

## 2019-06-24 LAB — COOXEMETRY PANEL
Carboxyhemoglobin: 2.7 % — ABNORMAL HIGH (ref 0.5–1.5)
Methemoglobin: 1.1 % (ref 0.0–1.5)
O2 Saturation: 74.5 %
Total hemoglobin: 10 g/dL — ABNORMAL LOW (ref 12.0–16.0)

## 2019-06-24 LAB — GLUCOSE, CAPILLARY
Glucose-Capillary: 108 mg/dL — ABNORMAL HIGH (ref 70–99)
Glucose-Capillary: 139 mg/dL — ABNORMAL HIGH (ref 70–99)
Glucose-Capillary: 146 mg/dL — ABNORMAL HIGH (ref 70–99)
Glucose-Capillary: 68 mg/dL — ABNORMAL LOW (ref 70–99)
Glucose-Capillary: 75 mg/dL (ref 70–99)
Glucose-Capillary: 82 mg/dL (ref 70–99)

## 2019-06-24 LAB — HEPARIN LEVEL (UNFRACTIONATED): Heparin Unfractionated: 0.33 IU/mL (ref 0.30–0.70)

## 2019-06-24 MED ORDER — POTASSIUM CHLORIDE CRYS ER 20 MEQ PO TBCR
40.0000 meq | EXTENDED_RELEASE_TABLET | Freq: Once | ORAL | Status: AC
  Administered 2019-06-24: 40 meq via ORAL
  Filled 2019-06-24: qty 2

## 2019-06-24 NOTE — Progress Notes (Addendum)
Advanced Heart Failure Rounding Note   Subjective:    Now on IV dobutamine co-ox up 56-> 64% -> 74%  Feeling better. Creatinine back down 5.1-> 4.8. IV lasix increased to 160q6 yesterday. Diuresing well. However, weight only down 1 pound. CVP 11-12 (checked personally)  Denies CP, sob, orthopnea or PND.   Objective:   Weight Range:  Vital Signs:   Temp:  [97.4 F (36.3 C)-97.5 F (36.4 C)] 97.4 F (36.3 C) (10/25 1148) Pulse Rate:  [67-76] 69 (10/25 1148) Resp:  [15-20] 18 (10/25 1148) BP: (105-124)/(60-69) 121/63 (10/25 1148) SpO2:  [95 %-98 %] 96 % (10/25 1148) Weight:  [93.9 kg] 93.9 kg (10/25 0358) Last BM Date: 06/23/19  Weight change: Filed Weights   06/19/19 1128 06/23/19 0319 06/24/19 0358  Weight: 90 kg 94.6 kg 93.9 kg    Intake/Output:   Intake/Output Summary (Last 24 hours) at 06/24/2019 1447 Last data filed at 06/24/2019 0800 Gross per 24 hour  Intake 100 ml  Output 3500 ml  Net -3400 ml     Physical Exam: General:  Elderly male lying in bed. Weak appearing. No resp difficulty HEENT: normal Neck: supple. JVP to jaw. Carotids 2+ bilat; no bruits. No lymphadenopathy or thryomegaly appreciated. Cor: PMI nondisplaced. Regular rate & rhythm. No rubs, gallops or murmurs. Lungs: clear Abdomen: soft, nontender, mildly distended. No hepatosplenomegaly. No bruits or masses. Good bowel sounds. Extremities: no cyanosis, clubbing, rash, 1-2+ edema Neuro: alert & orientedx3, cranial nerves grossly intact. moves all 4 extremities w/o difficulty. Affect pleasant  Telemetry: NSR 60-70s + PVC Personally reviewed   Labs: Basic Metabolic Panel: Recent Labs  Lab 06/19/19 1413 06/20/19 0616 06/21/19 0138 06/22/19 0401 06/23/19 0530 06/24/19 0627  NA 137 137 137 137 136 135  K 4.1 4.2 4.3 4.0 3.9 3.5  CL 102 100 101 103 102 100  CO2 21* 22 23 23 23 23   GLUCOSE 118* 146* 140* 112* 155* 90  BUN 77* 90* 102* 113* 120* 115*  CREATININE 3.26* 3.76* 4.09*  4.66* 5.16* 4.85*  CALCIUM 8.4* 8.1* 8.0* 8.0* 8.0* 8.0*  MG 2.1  --   --   --   --   --   PHOS 5.9*  --   --   --   --   --     Liver Function Tests: Recent Labs  Lab 06/20/19 0616 06/21/19 0138 06/22/19 0401 06/23/19 0530 06/24/19 0627  AST 19 20 18 15 16   ALT 22 19 20 16 15   ALKPHOS 62 47 39 42 45  BILITOT 12.8* 12.9* 10.6* 8.1* 6.7*  PROT 6.2* 6.0* 6.3* 6.4* 6.0*  ALBUMIN 1.8* 1.8* 1.8* 1.7* 1.7*   Recent Labs  Lab 06/18/19 2054  LIPASE 220*   No results for input(s): AMMONIA in the last 168 hours.  CBC: Recent Labs  Lab 06/19/19 0819 06/20/19 0616 06/21/19 0138 06/22/19 0401 06/24/19 0627  WBC 9.2 10.5 10.7* 6.4 5.7  HGB 11.7* 11.8* 10.4* 10.0* 9.7*  HCT 35.6* 33.7* 31.0* 28.3* 28.3*  MCV 87.5 84.5 85.2 83.0 83.7  PLT 211 184 164 145* 180    Cardiac Enzymes: No results for input(s): CKTOTAL, CKMB, CKMBINDEX, TROPONINI in the last 168 hours.  BNP: BNP (last 3 results) Recent Labs    08/16/18 0940 02/04/19 0906 06/18/19 2109  BNP 1,774.3* >4,500.0* 2,536.0*    ProBNP (last 3 results) No results for input(s): PROBNP in the last 8760 hours.    Other results:  Imaging: No results found.  Medications:     Scheduled Medications: . amoxicillin-clavulanate  1 tablet Oral Q12H  . aspirin EC  81 mg Oral Daily  . Chlorhexidine Gluconate Cloth  6 each Topical Daily  . feeding supplement (ENSURE ENLIVE)  237 mL Oral BID BM  . finasteride  5 mg Oral Daily  . insulin aspart  0-9 Units Subcutaneous Q4H  . multivitamin with minerals  1 tablet Oral Daily  . polycarbophil  625 mg Oral Daily  . saccharomyces boulardii  250 mg Oral BID  . sodium chloride flush  10-40 mL Intracatheter Q12H  . sodium chloride flush  3 mL Intravenous Once  . tamsulosin  0.4 mg Oral Daily    Infusions: . DOBUTamine 5 mcg/kg/min (06/24/19 0952)  . furosemide 160 mg (06/24/19 0944)  . heparin 1,700 Units/hr (06/23/19 2030)    PRN Medications: alum & mag  hydroxide-simeth, ondansetron (ZOFRAN) IV, sodium chloride flush   Assessment/Plan:    1. Acute/chronic systolic CHF: Concern for low output HF with cardiorenal syndrome with progressive worsening of renal function, creatinine now 4.66.  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  He does, of note, have anterior Qs on ECG.  Echo this admission showed EF < 20%, severe LV dilation, small LV apical thrombus, severely dilated and dysfunctional RV with flattened septum. Dobutamine started on 10/23. Co-ox 56%-> 64%-74%. Renal function starting to improve bu creatinine still nearly 5. Urine output markedly improved but weight only down a pound.  - CVP now 11-12. Continue increase lasix 160 q8.Can give metolazone as needed  - Unna boots 2. AKI: On CKD stage 3.  Initial creatinine 2.8, peaked 5.16 yesterday and now 4.85.  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  Baseline creatinine was 1.5 in 1/20 and 2.5 in 6/20 - Continue to support cardiac output with dobutamine.  - Hopefully creatinine will continue to improve with inotropic support and as renal venous pressure drops, but I am concerned that we may not be successful enough.  Dialysis would be very difficult for him with severe biventricular failure.  - Renal has seen. Patient initially refused short-term HD but now seems to be willing to consider. He is not candidate for long-term HD and given severe RV function likely not a candidate for VAD so long-term options limited to palliative inotropes or hospice so not sure if offering even short-term HD is reasonable. He lives alone at home and I am not sure he will be able to return there 3. 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  - No bleeding.  - Need to consider starting warfarin but not sure he can manage this. May be better suited with DOAC.  4. Ascending cholangitis: S/p ERCP and stone removal.  Now afebrile with normal WBCs.  - Continue Augmentin per  primary service.  5. Hyperbilirubinemia: Likely due to combination of CBD obstruction (now relieved) and RV failure with hepatic congestion.  6. Hypokalemia - supp 7. [place] - He had meeting with Palliative Care and Dr. Loleta Books on 10/24. He is now DNR but would like aggressive care up to and including VAD support if he would qualify but CKD and RV failure likely prohibit VAD and thus long-term options limited to hospice vs palliative home inotropes.    Length of Stay: 5   Glori Bickers MD 06/24/2019, 2:47 PM  Advanced Heart Failure Team Pager [phone] (M-F; 7a - 4p)  Please contact [facility] for night-coverage after hours (4p -7a )  and weekends on [email or web address]  

## 2019-06-24 NOTE — Progress Notes (Addendum)
ANTICOAGULATION CONSULT NOTE  Pharmacy Consult for Heparin Indication: LV thrombus  No Known Allergies  Patient Measurements: Height: 5\' 11"  (180.3 cm) Weight: 207 lb 0.2 oz (93.9 kg)(scale B) IBW/kg (Calculated) : 75.3  Heparin dosing weight: 90 kg  Vital Signs: Temp: 97.4 F (36.3 C) (10/25 0804) Temp Source: Oral (10/25 0804) BP: 118/60 (10/25 0804) Pulse Rate: 67 (10/25 0804)  Labs: Recent Labs    06/22/19 0401  06/23/19 0530 06/23/19 1434 06/24/19 0428 06/24/19 0627  HGB 10.0*  --   --   --   --  9.7*  HCT 28.3*  --   --   --   --  28.3*  PLT 145*  --   --   --   --  180  HEPARINUNFRC  --    < > 0.27* 0.27* 0.33  --   CREATININE 4.66*  --  5.16*  --   --  4.85*   < > = values in this interval not displayed.    Estimated Creatinine Clearance: 15.4 mL/min (A) (by C-G formula based on SCr of 4.85 mg/dL (H)).  Assessment: 78 y.o. 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  Heparin level at goal this AM, no overt bleeding or complications noted, CBC stable.  No overt bleeding or complications noted.  Goal of Therapy:  Heparin level 0.3-0.7 units/ml Monitor platelets by anticoagulation protocol: Yes   Plan:  Continue Heparin1700 units/hr. Daily heparin level and CBC  [name], [facility] Clinical Pharmacist Phone [phone]  06/24/2019 9:13 AM

## 2019-06-24 NOTE — Progress Notes (Addendum)
PROGRESS NOTE    [name]  SHF:[id number] DOB: [birth year] DOA: 06/18/2019 PCP: [name], MD      Brief Narrative:  [name] is a 78 y.o. M with HTN, sCHF EF 25%, CKD III baseline Cr 2 and alcohol use who presented with 4-5 days progressive abdominal pain with vomiting.  In the ER, US abdomen showed gallstones, was admitted for possible cholecystitis.  Found to have biventricular heart failure.  Treated with dobutamine and lasix.        Assessment & Plan:  Acute Cardiogenic Shock Decompensated Biventricular Failure LV thrombus Patient initially started on Lasix, Cardiology consulted.  Subsequently, renal function worsened, diuretics held, started on fluids.  The echocardiogram obtained which showed EF <20% (down from baseline 25%) and signs of decompensated biventricular failure.   Dobutamine started 10/23 SvO2 trending up, Cr improved but still >4 mg/dL.  3L UOP yesterday.  -Lasix/Dobutamine per Cardiology -Appreciate CHF management  -Hold BB, Entresto -Continue unna boots  -Continue ASA 81 mg QD -K supplement -Strict I/Os, daily weights, telemetry  -Daily monitoring renal function  -Continue heparin gtt -Favor Eliquis over warfarin when we transition to [facility].    Acute renal failure on CKD stage III Baseline ~2 mg/dL.  Cardiorenal syndrome now, but urine output looks much better hopefully able to make some improvement with inotropes.  HCO3, K fine.   -Appreciate Nephrology input   Goals of Care In discussion with Palliative Care, patient able to express that he appreciates current cares.  He understands limited expected lifespan. Priorities are returning home.  If home inotropes are an option, I suspect he would take it.  I hope that current medical therapy will improve his renal function enough that questions of CVVH can be shelved.  Acute choledocholithiasis and cholangitis WBC resolved, afebrile.  Today day 7 antibiotics.  GI have asked for  10 days total. -Continue Augmentin, day 7 of 10  Diarrhea This improved with diuresis.  Doubt Cdiff -Continue florastor, fiber     Ileus CT obtained due to patient abdominal discomfort, distension and nausea showed mild ileus.  Suspect this was from #1 above. Resolved, tolerating diet   Diabetes Glucoses controlled -Continue SS insulin corrections  BPH / Urinary Retention  Chronic indwelling foley  -Continue flomax, finasteride       DVT prophylaxis: SCDs Code Status: Full code Family Communication: Patient updated at bedside 10/25 on plan of care.     Consultants:   Cardiology  Gastroenterology  Nephrology  Palliative care   Procedures:   10/20 ERCP -- stones, purulent cholangitis  10/21 CT abdomen -- mild partial ileus  10/22 US renal  10/23 echo LVEF 10-15%.  Dilated LV.  RV moderately reduced systolic function and dilated.  IV septum flattened in diastole indicating volume overload.  L pleural effusion.  Apical thrombus.    10/23 PICC placement  Antimicrobials:   Zosyn 10/19 >> 10/22  Augmentin 10/23 >>  Culture data:   10/19 COVID >> negative   10/19 urine culture >> klebsiella pneumoniae >> S-ceftriaxone, bactrim, zosyn  10/20 blood culture x2 -- pending / NGTD >>  10/23 urine culture >> negative     Subjective: Afebrile, diarrhea better, swelling improved.  Excellent urine output.      Objective: Vitals:   06/24/19 0358 06/24/19 0804 06/24/19 1148 06/24/19 1533  BP: 120/69 118/60 121/63 114/84  Pulse: 75 67 69 70  Resp: 15 17 18 15   Temp: (!) 97.5 F (36.4 C) (!) 97.4 F (  36.3 C) (!) 97.4 F (36.3 C) (!) 97.4 F (36.3 C)  TempSrc: Oral Oral Oral Oral  SpO2: 95% 98% 96% 96%  Weight: 93.9 kg     Height:        Intake/Output Summary (Last 24 hours) at 06/24/2019 1919 Last data filed at 06/24/2019 1600 Gross per 24 hour  Intake 338.15 ml  Output 5000 ml  Net -4661.85 ml   Filed Weights   06/19/19 1128 06/23/19 0319  06/24/19 0358  Weight: 90 kg 94.6 kg 93.9 kg    Examination: General appearance: Male, lying in bed, no acute distress, interactive    HEENT: Mucous membranes pink and moist, dentition poor, no nasal deformity or drainage, sclera anicteric. Skin: Skin warm and dry, no rashes or lesions Cardiac: RRR, summation gallop, Unna boots, 2+ pitting lower extremity edema, improved from yesterday, JVP elevated Respiratory: Respirations even and unlabored, lungs clear anterior, diminished lateral and posterior, good air entry in general Abdomen: Soft and nontender, bowel sounds active, no distention or ascites MSK: Some diffuse loss of subcutaneous muscle mass and fat  Neuro: Awake and alert, extraocular movements intact, moves all extremities with generalized weakness but no focal deficits, speech fluent Psych: Attention normal, affect normal, judgment insight appear normal    Data Reviewed: I have personally reviewed following labs and imaging studies:  CBC: Recent Labs  Lab 06/19/19 0819 06/20/19 0616 06/21/19 0138 06/22/19 0401 06/24/19 0627  WBC 9.2 10.5 10.7* 6.4 5.7  HGB 11.7* 11.8* 10.4* 10.0* 9.7*  HCT 35.6* 33.7* 31.0* 28.3* 28.3*  MCV 87.5 84.5 85.2 83.0 83.7  PLT 211 184 164 145* 007   Basic Metabolic Panel: Recent Labs  Lab 06/19/19 1413 06/20/19 0616 06/21/19 0138 06/22/19 0401 06/23/19 0530 06/24/19 0627  NA 137 137 137 137 136 135  K 4.1 4.2 4.3 4.0 3.9 3.5  CL 102 100 101 103 102 100  CO2 21* 22 23 23 23 23   GLUCOSE 118* 146* 140* 112* 155* 90  BUN 77* 90* 102* 113* 120* 115*  CREATININE 3.26* 3.76* 4.09* 4.66* 5.16* 4.85*  CALCIUM 8.4* 8.1* 8.0* 8.0* 8.0* 8.0*  MG 2.1  --   --   --   --   --   PHOS 5.9*  --   --   --   --   --    GFR: Estimated Creatinine Clearance: 15.4 mL/min (A) (by C-G formula based on SCr of 4.85 mg/dL (H)). Liver Function Tests: Recent Labs  Lab 06/20/19 0616 06/21/19 0138 06/22/19 0401 06/23/19 0530 06/24/19 0627  AST 19 20  18 15 16   ALT 22 19 20 16 15   ALKPHOS 62 47 39 42 45  BILITOT 12.8* 12.9* 10.6* 8.1* 6.7*  PROT 6.2* 6.0* 6.3* 6.4* 6.0*  ALBUMIN 1.8* 1.8* 1.8* 1.7* 1.7*   Recent Labs  Lab 06/18/19 2054  LIPASE 220*   No results for input(s): AMMONIA in the last 168 hours. Coagulation Profile: Recent Labs  Lab 06/19/19 0036  INR 1.5*   Cardiac Enzymes: No results for input(s): CKTOTAL, CKMB, CKMBINDEX, TROPONINI in the last 168 hours. BNP (last 3 results) No results for input(s): PROBNP in the last 8760 hours. HbA1C: No results for input(s): HGBA1C in the last 72 hours. CBG: Recent Labs  Lab 06/24/19 0014 06/24/19 0407 06/24/19 0806 06/24/19 1150 06/24/19 1535  GLUCAP 75 82 68* 146* 139*   Lipid Profile: No results for input(s): CHOL, HDL, LDLCALC, TRIG, CHOLHDL, LDLDIRECT in the last 72 hours. Thyroid Function Tests:  No results for input(s): TSH, T4TOTAL, FREET4, T3FREE, THYROIDAB in the last 72 hours. Anemia Panel: No results for input(s): VITAMINB12, FOLATE, FERRITIN, TIBC, IRON, RETICCTPCT in the last 72 hours. Urine analysis:    Component Value Date/Time   COLORURINE AMBER (A) 06/21/2019 0916   APPEARANCEUR CLOUDY (A) 06/21/2019 0916   LABSPEC 1.020 06/21/2019 0916   PHURINE 5.0 06/21/2019 0916   GLUCOSEU NEGATIVE 06/21/2019 0916   HGBUR MODERATE (A) 06/21/2019 0916   BILIRUBINUR SMALL (A) 06/21/2019 0916   KETONESUR NEGATIVE 06/21/2019 0916   PROTEINUR 30 (A) 06/21/2019 0916   NITRITE NEGATIVE 06/21/2019 0916   LEUKOCYTESUR MODERATE (A) 06/21/2019 0916   Sepsis Labs: @LABRCNTIP (procalcitonin:4,lacticacidven:4)  ) Recent Results (from the past 240 hour(s))  Urine culture     Status: Abnormal   Collection Time: 06/18/19 11:05 PM   Specimen: Urine, Random  Result Value Ref Range Status   Specimen Description URINE, RANDOM  Final   Special Requests   Final    NONE Performed at [facility], [place] [address] [place], [place] [address]    Culture >=100,000  COLONIES/mL KLEBSIELLA PNEUMONIAE (A)  Final   Report Status 06/21/2019 FINAL  Final   Organism ID, Bacteria KLEBSIELLA PNEUMONIAE (A)  Final      Susceptibility   Klebsiella pneumoniae - MIC*    AMPICILLIN RESISTANT Resistant     CEFAZOLIN <=4 SENSITIVE Sensitive     CEFTRIAXONE <=1 SENSITIVE Sensitive     CIPROFLOXACIN <=0.25 SENSITIVE Sensitive     GENTAMICIN <=1 SENSITIVE Sensitive     IMIPENEM <=0.25 SENSITIVE Sensitive     NITROFURANTOIN 32 SENSITIVE Sensitive     TRIMETH/SULFA <=20 SENSITIVE Sensitive     AMPICILLIN/SULBACTAM 4 SENSITIVE Sensitive     PIP/TAZO <=4 SENSITIVE Sensitive     Extended ESBL NEGATIVE Sensitive     * >=100,000 COLONIES/mL KLEBSIELLA PNEUMONIAE  SARS Coronavirus 2 by RT PCR (hospital order, performed in [place] hospital lab) Nasopharyngeal Nasopharyngeal Swab     Status: None   Collection Time: 06/18/19 11:25 PM   Specimen: Nasopharyngeal Swab  Result Value Ref Range Status   SARS Coronavirus 2 NEGATIVE NEGATIVE Final    Comment: (NOTE) If result is NEGATIVE SARS-CoV-2 target nucleic acids are NOT DETECTED. The SARS-CoV-2 RNA is generally detectable in upper and lower  respiratory specimens during the acute phase of infection. The lowest  concentration of SARS-CoV-2 viral copies this assay can detect is 250  copies / mL. A negative result does not preclude SARS-CoV-2 infection  and should not be used as the sole basis for treatment or other  patient management decisions.  A negative result may occur with  improper specimen collection / handling, submission of specimen other  than nasopharyngeal swab, presence of viral mutation(s) within the  areas targeted by this assay, and inadequate number of viral copies  (<250 copies / mL). A negative result must be combined with clinical  observations, patient history, and epidemiological information. If result is POSITIVE SARS-CoV-2 target nucleic acids are DETECTED. The SARS-CoV-2 RNA is generally  detectable in upper and lower  respiratory specimens dur ing the acute phase of infection.  Positive  results are indicative of active infection with SARS-CoV-2.  Clinical  correlation with patient history and other diagnostic information is  necessary to determine patient infection status.  Positive results do  not rule out bacterial infection or co-infection with other viruses. If result is PRESUMPTIVE POSTIVE SARS-CoV-2 nucleic acids MAY BE PRESENT.   A presumptive  positive result was obtained on the submitted specimen  and confirmed on repeat testing.  While 2019 novel coronavirus  (SARS-CoV-2) nucleic acids may be present in the submitted sample  additional confirmatory testing may be necessary for epidemiological  and / or clinical management purposes  to differentiate between  SARS-CoV-2 and other Sarbecovirus currently known to infect humans.  If clinically indicated additional testing with an alternate test  methodology [phone]) is advised. The SARS-CoV-2 RNA is generally  detectable in upper and lower respiratory sp ecimens during the acute  phase of infection. The expected result is Negative. Fact Sheet for Patients:  StrictlyIdeas.no Fact Sheet for Healthcare Providers: BankingDealers.co.za This test is not yet approved or cleared by the [place] FDA and has been authorized for detection and/or diagnosis of SARS-CoV-2 by FDA under an Emergency Use Authorization (EUA).  This EUA will remain in effect (meaning this test can be used) for the duration of the COVID-19 declaration under Section 564(b)(1) of the Act, 21 U.S.C. section 360bbb-3(b)(1), unless the authorization is terminated or revoked sooner. Performed at [facility], [place] [address], [place], [place] [address]   Culture, blood (routine x 2)     Status: None   Collection Time: 06/19/19  1:17 AM   Specimen: BLOOD LEFT HAND  Result Value Ref Range Status    Specimen Description BLOOD LEFT HAND  Final   Special Requests   Final    BOTTLES DRAWN AEROBIC AND ANAEROBIC Blood Culture results may not be optimal due to an inadequate volume of blood received in culture bottles   Culture   Final    NO GROWTH 5 DAYS Performed at [facility], [place] [address], [place], [place] [address]    Report Status 06/24/2019 FINAL  Final  Culture, blood (routine x 2)     Status: None   Collection Time: 06/19/19  1:30 AM   Specimen: BLOOD RIGHT HAND  Result Value Ref Range Status   Specimen Description BLOOD RIGHT HAND  Final   Special Requests   Final    BOTTLES DRAWN AEROBIC AND ANAEROBIC Blood Culture results may not be optimal due to an inadequate volume of blood received in culture bottles   Culture   Final    NO GROWTH 5 DAYS Performed at [facility], [place] [address], [place], [place] [address]    Report Status 06/24/2019 FINAL  Final  Culture, Urine     Status: None   Collection Time: 06/22/19  8:16 AM   Specimen: Urine, Catheterized  Result Value Ref Range Status   Specimen Description URINE, CATHETERIZED  Final   Special Requests NONE  Final   Culture   Final    NO GROWTH Performed at [facility], [address] [place], [place] [address]    Report Status 06/23/2019 FINAL  Final         Radiology Studies: No results found.      Scheduled Meds: . amoxicillin-clavulanate  1 tablet Oral Q12H  . aspirin EC  81 mg Oral Daily  . Chlorhexidine Gluconate Cloth  6 each Topical Daily  . feeding supplement (ENSURE ENLIVE)  237 mL Oral BID BM  . finasteride  5 mg Oral Daily  . insulin aspart  0-9 Units Subcutaneous Q4H  . multivitamin with minerals  1 tablet Oral Daily  . polycarbophil  625 mg Oral Daily  . saccharomyces boulardii  250 mg Oral BID  . sodium chloride flush  10-40  mL Intracatheter Q12H  . sodium chloride flush  3 mL Intravenous Once  . tamsulosin  0.4 mg Oral Daily   Continuous Infusions: . DOBUTamine  5 mcg/kg/min (06/24/19 0952)  . furosemide 160 mg (06/24/19 1717)  . heparin 1,700 Units/hr (06/23/19 2030)     LOS: 5 days    Time spent: 60 minutes    Noe Gens, AG-ACNP-S  [facility] 06/24/2019, 7:19 PM     Please page through [place]:  [email or web address] Password [email or web address] If 7PM-7AM, please contact night-coverage     Attending MD Note:  I have seen and examined the patient with nurse practitioner/physician assistant and agree with the note above which has been edited to reflect our agreed upon history, exam, and assessment/plan.   I have personally reviewed the orders for the patient, which were made under my direction.       [place]

## 2019-06-24 NOTE — Progress Notes (Signed)
[facility]    NEPHROLOGY PROGRESS NOTE  SUBJECTIVE: Patient seen and examined.  Reports feeling generally well.  Denies any appetite disturbance.  Denies chest pain, shortness of breath, nausea or vomiting.  All other review of systems are negative.    OBJECTIVE:  Vitals:   06/24/19 0804 06/24/19 1148  BP: 118/60 121/63  Pulse: 67 69  Resp: 17 18  Temp: (!) 97.4 F (36.3 C) (!) 97.4 F (36.3 C)  SpO2: 98% 96%    Intake/Output Summary (Last 24 hours) at 06/24/2019 1449 Last data filed at 06/24/2019 0800 Gross per 24 hour  Intake 100 ml  Output 3500 ml  Net -3400 ml      General:  AAOx3 NAD HEENT: MMM [place] AT anicteric sclera Neck: Minimal JVD, no adenopathy CV:  Heart RRR  Lungs:  L/S CTA bilaterally Abd:  abd SNT/ND with normal BS GU:  Bladder non-palpable Extremities: 2+ bilateral lower extremity edema  skin:  No skin rash  MEDICATIONS:  . amoxicillin-clavulanate  1 tablet Oral Q12H  . aspirin EC  81 mg Oral Daily  . Chlorhexidine Gluconate Cloth  6 each Topical Daily  . feeding supplement (ENSURE ENLIVE)  237 mL Oral BID BM  . finasteride  5 mg Oral Daily  . insulin aspart  0-9 Units Subcutaneous Q4H  . multivitamin with minerals  1 tablet Oral Daily  . polycarbophil  625 mg Oral Daily  . saccharomyces boulardii  250 mg Oral BID  . sodium chloride flush  10-40 mL Intracatheter Q12H  . sodium chloride flush  3 mL Intravenous Once  . tamsulosin  0.4 mg Oral Daily       LABS:   CBC Latest Ref Rng & Units 06/24/2019 06/22/2019 06/21/2019  WBC 4.0 - 10.5 K/uL 5.7 6.4 10.7(H)  Hemoglobin 13.0 - 17.0 g/dL 9.7(L) 10.0(L) 10.4(L)  Hematocrit 39.0 - 52.0 % 28.3(L) 28.3(L) 31.0(L)  Platelets 150 - 400 K/uL 180 145(L) 164    CMP Latest Ref Rng & Units 06/24/2019 06/23/2019 06/22/2019  Glucose 70 - 99 mg/dL 90 155(H) 112(H)  BUN 8 - 23 mg/dL 115(H) 120(H) 113(H)  Creatinine 0.61 - 1.24 mg/dL 4.85(H) 5.16(H) 4.66(H)  Sodium 135 - 145 mmol/L 135  136 137  Potassium 3.5 - 5.1 mmol/L 3.5 3.9 4.0  Chloride 98 - 111 mmol/L 100 102 103  CO2 22 - 32 mmol/L 23 23 23   Calcium 8.9 - 10.3 mg/dL 8.0(L) 8.0(L) 8.0(L)  Total Protein 6.5 - 8.1 g/dL 6.0(L) 6.4(L) 6.3(L)  Total Bilirubin 0.3 - 1.2 mg/dL 6.7(H) 8.1(H) 10.6(H)  Alkaline Phos 38 - 126 U/L 45 42 39  AST 15 - 41 U/L 16 15 18   ALT 0 - 44 U/L 15 16 20     Lab Results  Component Value Date   CALCIUM 8.0 (L) 06/24/2019   CAION 1.14 (L) 09/13/2018   PHOS 5.9 (H) 06/19/2019       Component Value Date/Time   COLORURINE AMBER (A) 06/21/2019 0916   APPEARANCEUR CLOUDY (A) 06/21/2019 0916   LABSPEC 1.020 06/21/2019 0916   PHURINE 5.0 06/21/2019 0916   GLUCOSEU NEGATIVE 06/21/2019 0916   HGBUR MODERATE (A) 06/21/2019 0916   BILIRUBINUR SMALL (A) 06/21/2019 0916   KETONESUR NEGATIVE 06/21/2019 0916   PROTEINUR 30 (A) 06/21/2019 0916   NITRITE NEGATIVE 06/21/2019 0916   LEUKOCYTESUR MODERATE (A) 06/21/2019 0916      Component Value Date/Time   PHART 7.196 (LL) 02/26/2015 0208   PCO2ART 58.7 ([place]) 02/26/2015 8563  PO2ART 280.0 (H) 02/26/2015 0208   HCO3 22.7 02/26/2015 0208   TCO2 25 09/13/2018 0604   ACIDBASEDEF 6.0 (H) 02/26/2015 0208   O2SAT 74.5 06/24/2019 0600    No results found for: IRON, TIBC, FERRITIN, IRONPCTSAT     ASSESSMENT/PLAN:     1. CKD stage III-IV.  Baseline serum creatinine around 2-2.2  2. Acute kidney injury.  Likely secondary to hemodynamics.  He has had some urine sediment but overall picture is most consistent with ATN.  No indication for urgent dialysis.  Continue to monitor.  Urine output excellent in the past 24 hours.  Patient has not wanted dialysis but now is telling other physicians that he may be agreeable if needed.  His conversation with palliative care today suggest that he did not want to be hooked up to machines.  No urgent indication for dialysis currently.  Carvedilol held due to low blood pressures.  Renal ultrasound with increased  echogenicity but no obstruction.  Doubt AIN related to Zosyn due to short timeframe of exposure.  3.  CHF - continue dobutamine  and Lasix.  Appreciate cardiology input.   4.  DM 5.  BPH with chronic indwelling foley; just changed out Monday. 6.  Abd pain secondary to choledocholithiasis and cholangitis, s/p stone removal.      Gaylene Brooks, DO, FACP

## 2019-06-25 DIAGNOSIS — N179 Acute kidney failure, unspecified: Secondary | ICD-10-CM | POA: Diagnosis not present

## 2019-06-25 DIAGNOSIS — I5043 Acute on chronic combined systolic (congestive) and diastolic (congestive) heart failure: Secondary | ICD-10-CM | POA: Diagnosis not present

## 2019-06-25 LAB — COMPREHENSIVE METABOLIC PANEL
ALT: 17 U/L (ref 0–44)
AST: 19 U/L (ref 15–41)
Albumin: 1.8 g/dL — ABNORMAL LOW (ref 3.5–5.0)
Alkaline Phosphatase: 49 U/L (ref 38–126)
Anion gap: 12 (ref 5–15)
BUN: 109 mg/dL — ABNORMAL HIGH (ref 8–23)
CO2: 27 mmol/L (ref 22–32)
Calcium: 8.6 mg/dL — ABNORMAL LOW (ref 8.9–10.3)
Chloride: 99 mmol/L (ref 98–111)
Creatinine, Ser: 4.19 mg/dL — ABNORMAL HIGH (ref 0.61–1.24)
GFR calc Af Amer: 15 mL/min — ABNORMAL LOW (ref >60)
GFR calc non Af Amer: 13 mL/min — ABNORMAL LOW (ref >60)
Glucose, Bld: 106 mg/dL — ABNORMAL HIGH (ref 70–99)
Potassium: 3.9 mmol/L (ref 3.5–5.1)
Sodium: 138 mmol/L (ref 135–145)
Total Bilirubin: 6.5 mg/dL — ABNORMAL HIGH (ref 0.3–1.2)
Total Protein: 6.7 g/dL (ref 6.5–8.1)

## 2019-06-25 LAB — COOXEMETRY PANEL
Carboxyhemoglobin: 2.9 % — ABNORMAL HIGH (ref 0.5–1.5)
Methemoglobin: 1.1 % (ref 0.0–1.5)
O2 Saturation: 65.5 %
Total hemoglobin: 10.9 g/dL — ABNORMAL LOW (ref 12.0–16.0)

## 2019-06-25 LAB — CBC
HCT: 30.5 % — ABNORMAL LOW (ref 39.0–52.0)
Hemoglobin: 10.5 g/dL — ABNORMAL LOW (ref 13.0–17.0)
MCH: 28.8 pg (ref 26.0–34.0)
MCHC: 34.4 g/dL (ref 30.0–36.0)
MCV: 83.6 fL (ref 80.0–100.0)
Platelets: 202 10*3/uL (ref 150–400)
RBC: 3.65 MIL/uL — ABNORMAL LOW (ref 4.22–5.81)
RDW: 17.7 % — ABNORMAL HIGH (ref 11.5–15.5)
WBC: 6.3 10*3/uL (ref 4.0–10.5)
nRBC: 0.6 % — ABNORMAL HIGH (ref 0.0–0.2)

## 2019-06-25 LAB — GLUCOSE, CAPILLARY
Glucose-Capillary: 124 mg/dL — ABNORMAL HIGH (ref 70–99)
Glucose-Capillary: 143 mg/dL — ABNORMAL HIGH (ref 70–99)
Glucose-Capillary: 200 mg/dL — ABNORMAL HIGH (ref 70–99)
Glucose-Capillary: 232 mg/dL — ABNORMAL HIGH (ref 70–99)
Glucose-Capillary: 99 mg/dL (ref 70–99)

## 2019-06-25 LAB — HEPARIN LEVEL (UNFRACTIONATED): Heparin Unfractionated: 0.51 IU/mL (ref 0.30–0.70)

## 2019-06-25 MED ORDER — ISOSORB DINITRATE-HYDRALAZINE 20-37.5 MG PO TABS
0.5000 | ORAL_TABLET | Freq: Three times a day (TID) | ORAL | Status: DC
  Administered 2019-06-25 – 2019-06-26 (×4): 0.5 via ORAL
  Filled 2019-06-25 (×4): qty 1

## 2019-06-25 MED ORDER — WARFARIN SODIUM 5 MG PO TABS
5.0000 mg | ORAL_TABLET | Freq: Once | ORAL | Status: AC
  Administered 2019-06-25: 5 mg via ORAL
  Filled 2019-06-25: qty 1

## 2019-06-25 MED ORDER — TORSEMIDE 20 MG PO TABS: 40.0000 mg | ORAL_TABLET | Freq: Every day | ORAL | Status: DC

## 2019-06-25 MED ORDER — SEVELAMER CARBONATE 800 MG PO TABS
800.0000 mg | ORAL_TABLET | Freq: Three times a day (TID) | ORAL | Status: DC
  Administered 2019-06-25 – 2019-07-09 (×33): 800 mg via ORAL
  Filled 2019-06-25 (×32): qty 1

## 2019-06-25 MED ORDER — WARFARIN - PHARMACIST DOSING INPATIENT: Freq: Every day | Status: DC

## 2019-06-25 NOTE — Progress Notes (Signed)
Responded to spiritual consult for an AD. PT was alert and stated he spoke with family about who his power of attorney is. I checked the AD and it is ready for notary. However, there are no volunteers at this time. Will follow up later in the day or in the Morning.   Palliative care  Chaplain Resident,  Fidel Levy [phone]

## 2019-06-25 NOTE — Progress Notes (Signed)
PROGRESS NOTE    [name]  CXK:[id number] DOB: Apr 27, 1941 DOA: 06/18/2019 PCP: [name], MD      Brief Narrative:  [name] is a 78 y.o. M with HTN, sCHF EF 25%, CKD III baseline Cr 2 and alcohol use who presented with 4-5 days progressive abdominal pain with vomiting.  In the ER, US abdomen showed gallstones, was admitted for possible cholecystitis.  Found to have biventricular heart failure.  Treated with dobutamine and lasix.        Assessment & Plan:  Acute Cardiogenic Shock Decompensated Biventricular Failure LV thrombus Patient initially started on Lasix, Cardiology consulted.  Subsequently, renal function worsened, diuretics held, started on fluids.  The echocardiogram obtained which showed EF <20% (down from baseline 25%) and signs of decompensated biventricular failure.   Dobutamine started 10/23 Prodigious UOP yesterday, -7.8L, BP stable, Cr better as kidneys decompress, BUN down, K 3.9  -Diuretics/Dobutamine per Cardiology -Appreciate CHF management  -Hold BB, Entresto, BiDil until they recommend them -Continue unna boots  -Continue aspirin 81  -Strict I/Os, daily weights, telemetry  -Daily monitoring renal function -Stop heparin, start oral AC, I recommend Eliquis over warfarin, despite SCr but will defer to Cardiology    Acute renal failure on CKD stage III Cr improving.  No needs for HD at this time.    -Appreciate Nephrology input   Acute choledocholithiasis and cholangitis WBC resolved, afebrile.  Today day 7 antibiotics.  GI have asked for 10 days total. -Continue Augmentin, day 8 of 10  Diarrhea Resolved -Continue florastor, fiber     Ileus CT obtained due to patient abdominal discomfort, distension and nausea showed mild ileus.  Suspect this was from #1 above. Resolved, tolerating diet   Diabetes Glucose controlled -Continue SS insulin corrections  BPH / Urinary Retention  Chronic indwelling foley  -Continue  flomax, finasteride       DVT prophylaxis: Heparin to oral AC today Code Status: Full code Family Communication:      Consultants:   Cardiology  Gastroenterology  Nephrology  Palliative care   Procedures:   10/20 ERCP -- stones, purulent cholangitis  10/21 CT abdomen -- mild partial ileus  10/22 US renal  10/23 echo LVEF 10-15%.  Dilated LV.  RV moderately reduced systolic function and dilated.  IV septum flattened in diastole indicating volume overload.  L pleural effusion.  Apical thrombus.    10/23 PICC placement  Antimicrobials:   Zosyn 10/19 >> 10/22  Augmentin 10/23 >>  Culture data:   10/19 COVID >> negative   10/19 urine culture >> klebsiella pneumoniae >> S-ceftriaxone, bactrim, zosyn  10/20 blood culture x2 -- pending / NGTD >>  10/23 urine culture >> negative     Subjective: Diarrhea resolved.  Swelling much better.  Excellent urine output.  No fever, right upper quadrant pain, vomiting, jaundice.       Objective: Vitals:   06/25/19 0411 06/25/19 0426 06/25/19 0805 06/25/19 1100  BP: 128/66  136/71 (!) 123/59  Pulse: 70  79 73  Resp: 17  17 (!) 24  Temp: (!) 97.5 F (36.4 C)  97.6 F (36.4 C)   TempSrc: Oral  Oral   SpO2: 97%  98% 98%  Weight:  87.5 kg    Height:        Intake/Output Summary (Last 24 hours) at 06/25/2019 1112 Last data filed at 06/25/2019 0804 Gross per 24 hour  Intake 338.15 ml  Output 8300 ml  Net -7961.85 ml   Filed  Weights   06/23/19 0319 06/24/19 0358 06/25/19 0426  Weight: 94.6 kg 93.9 kg 87.5 kg    Examination: General appearance: Large adult male, lying in bed, no acute distress, interactive HEENT: Sclera slightly icteric, lids and lashes normal, conjunctival pink, no nasal deformity, discharge, or epistaxis.  Lips dry, oropharynx moist, dentition normal, no oral lesions, hearing normal. Skin: Skin warm and dry without suspicious rashes or lesions, some chronic venous stasis changes in the  legs, not impressive.   Cardiac: RRR, gallop is resolved, in a boot to been taken off, no living extremity pitting edema, JVP slightly elevated above the clavicle. Respiratory: Respirations even and unlabored, lungs clear, good air entry Abdomen: Abdomen soft without tenderness palpation, or guarding, no distention or ascites. MSK: Diffuse loss of subcutaneous muscle mass and fat, thenar wasting,  Neuro: Awake and alert, extraocular movements intact, moves all extremities with generalized weakness, but no focal deficits, normal and symmetric coordination, speech fluent. Psych: Attention normal, affect blunted, mild psychomotor slowing, judgment and insight appear normal      Data Reviewed: I have personally reviewed following labs and imaging studies:  CBC: Recent Labs  Lab 06/20/19 0616 06/21/19 0138 06/22/19 0401 06/24/19 0627 06/25/19 0441  WBC 10.5 10.7* 6.4 5.7 6.3  HGB 11.8* 10.4* 10.0* 9.7* 10.5*  HCT 33.7* 31.0* 28.3* 28.3* 30.5*  MCV 84.5 85.2 83.0 83.7 83.6  PLT 184 164 145* 180 364   Basic Metabolic Panel: Recent Labs  Lab 06/19/19 1413  06/21/19 0138 06/22/19 0401 06/23/19 0530 06/24/19 0627 06/25/19 0441  NA 137   < > 137 137 136 135 138  K 4.1   < > 4.3 4.0 3.9 3.5 3.9  CL 102   < > 101 103 102 100 99  CO2 21*   < > 23 23 23 23 27   GLUCOSE 118*   < > 140* 112* 155* 90 106*  BUN 77*   < > 102* 113* 120* 115* 109*  CREATININE 3.26*   < > 4.09* 4.66* 5.16* 4.85* 4.19*  CALCIUM 8.4*   < > 8.0* 8.0* 8.0* 8.0* 8.6*  MG 2.1  --   --   --   --   --   --   PHOS 5.9*  --   --   --   --   --   --    < > = values in this interval not displayed.   GFR: Estimated Creatinine Clearance: 16.2 mL/min (A) (by C-G formula based on SCr of 4.19 mg/dL (H)). Liver Function Tests: Recent Labs  Lab 06/21/19 0138 06/22/19 0401 06/23/19 0530 06/24/19 0627 06/25/19 0441  AST 20 18 15 16 19   ALT 19 20 16 15 17   ALKPHOS 47 39 42 45 49  BILITOT 12.9* 10.6* 8.1* 6.7* 6.5*   PROT 6.0* 6.3* 6.4* 6.0* 6.7  ALBUMIN 1.8* 1.8* 1.7* 1.7* 1.8*   Recent Labs  Lab 06/18/19 2054  LIPASE 220*   No results for input(s): AMMONIA in the last 168 hours. Coagulation Profile: Recent Labs  Lab 06/19/19 0036  INR 1.5*   Cardiac Enzymes: No results for input(s): CKTOTAL, CKMB, CKMBINDEX, TROPONINI in the last 168 hours. BNP (last 3 results) No results for input(s): PROBNP in the last 8760 hours. HbA1C: No results for input(s): HGBA1C in the last 72 hours. CBG: Recent Labs  Lab 06/24/19 1535 06/24/19 2045 06/25/19 0030 06/25/19 0420 06/25/19 1056  GLUCAP 139* 108* 143* 99 232*   Lipid Profile: No results for input(s):  CHOL, HDL, LDLCALC, TRIG, CHOLHDL, LDLDIRECT in the last 72 hours. Thyroid Function Tests: No results for input(s): TSH, T4TOTAL, FREET4, T3FREE, THYROIDAB in the last 72 hours. Anemia Panel: No results for input(s): VITAMINB12, FOLATE, FERRITIN, TIBC, IRON, RETICCTPCT in the last 72 hours. Urine analysis:    Component Value Date/Time   COLORURINE AMBER (A) 06/21/2019 0916   APPEARANCEUR CLOUDY (A) 06/21/2019 0916   LABSPEC 1.020 06/21/2019 0916   PHURINE 5.0 06/21/2019 0916   GLUCOSEU NEGATIVE 06/21/2019 0916   HGBUR MODERATE (A) 06/21/2019 0916   BILIRUBINUR SMALL (A) 06/21/2019 0916   KETONESUR NEGATIVE 06/21/2019 0916   PROTEINUR 30 (A) 06/21/2019 0916   NITRITE NEGATIVE 06/21/2019 0916   LEUKOCYTESUR MODERATE (A) 06/21/2019 0916   Sepsis Labs: @LABRCNTIP (procalcitonin:4,lacticacidven:4)  ) Recent Results (from the past 240 hour(s))  Urine culture     Status: Abnormal   Collection Time: 06/18/19 11:05 PM   Specimen: Urine, Random  Result Value Ref Range Status   Specimen Description URINE, RANDOM  Final   Special Requests   Final    NONE Performed at [facility], [place] [address], [place], [place] [address]    Culture >=100,000 COLONIES/mL KLEBSIELLA PNEUMONIAE (A)  Final   Report Status 06/21/2019 FINAL  Final    Organism ID, Bacteria KLEBSIELLA PNEUMONIAE (A)  Final      Susceptibility   Klebsiella pneumoniae - MIC*    AMPICILLIN RESISTANT Resistant     CEFAZOLIN <=4 SENSITIVE Sensitive     CEFTRIAXONE <=1 SENSITIVE Sensitive     CIPROFLOXACIN <=0.25 SENSITIVE Sensitive     GENTAMICIN <=1 SENSITIVE Sensitive     IMIPENEM <=0.25 SENSITIVE Sensitive     NITROFURANTOIN 32 SENSITIVE Sensitive     TRIMETH/SULFA <=20 SENSITIVE Sensitive     AMPICILLIN/SULBACTAM 4 SENSITIVE Sensitive     PIP/TAZO <=4 SENSITIVE Sensitive     Extended ESBL NEGATIVE Sensitive     * >=100,000 COLONIES/mL KLEBSIELLA PNEUMONIAE  SARS Coronavirus 2 by RT PCR (hospital order, performed in [place] hospital lab) Nasopharyngeal Nasopharyngeal Swab     Status: None   Collection Time: 06/18/19 11:25 PM   Specimen: Nasopharyngeal Swab  Result Value Ref Range Status   SARS Coronavirus 2 NEGATIVE NEGATIVE Final    Comment: (NOTE) If result is NEGATIVE SARS-CoV-2 target nucleic acids are NOT DETECTED. The SARS-CoV-2 RNA is generally detectable in upper and lower  respiratory specimens during the acute phase of infection. The lowest  concentration of SARS-CoV-2 viral copies this assay can detect is 250  copies / mL. A negative result does not preclude SARS-CoV-2 infection  and should not be used as the sole basis for treatment or other  patient management decisions.  A negative result may occur with  improper specimen collection / handling, submission of specimen other  than nasopharyngeal swab, presence of viral mutation(s) within the  areas targeted by this assay, and inadequate number of viral copies  (<250 copies / mL). A negative result must be combined with clinical  observations, patient history, and epidemiological information. If result is POSITIVE SARS-CoV-2 target nucleic acids are DETECTED. The SARS-CoV-2 RNA is generally detectable in upper and lower  respiratory specimens dur ing the acute phase of infection.   Positive  results are indicative of active infection with SARS-CoV-2.  Clinical  correlation with patient history and other diagnostic information is  necessary to determine patient infection status.  Positive results do  not rule out bacterial infection or co-infection with other viruses. If  result is PRESUMPTIVE POSTIVE SARS-CoV-2 nucleic acids MAY BE PRESENT.   A presumptive positive result was obtained on the submitted specimen  and confirmed on repeat testing.  While 2019 novel coronavirus  (SARS-CoV-2) nucleic acids may be present in the submitted sample  additional confirmatory testing may be necessary for epidemiological  and / or clinical management purposes  to differentiate between  SARS-CoV-2 and other Sarbecovirus currently known to infect humans.  If clinically indicated additional testing with an alternate test  methodology [phone]) is advised. The SARS-CoV-2 RNA is generally  detectable in upper and lower respiratory sp ecimens during the acute  phase of infection. The expected result is Negative. Fact Sheet for Patients:  StrictlyIdeas.no Fact Sheet for Healthcare Providers: BankingDealers.co.za This test is not yet approved or cleared by the [place] FDA and has been authorized for detection and/or diagnosis of SARS-CoV-2 by FDA under an Emergency Use Authorization (EUA).  This EUA will remain in effect (meaning this test can be used) for the duration of the COVID-19 declaration under Section 564(b)(1) of the Act, 21 U.S.C. section 360bbb-3(b)(1), unless the authorization is terminated or revoked sooner. Performed at [facility], [place] [address] [place], [place] [address]   Culture, blood (routine x 2)     Status: None   Collection Time: 06/19/19  1:17 AM   Specimen: BLOOD LEFT HAND  Result Value Ref Range Status   Specimen Description BLOOD LEFT HAND  Final   Special Requests   Final    BOTTLES DRAWN  AEROBIC AND ANAEROBIC Blood Culture results may not be optimal due to an inadequate volume of blood received in culture bottles   Culture   Final    NO GROWTH 5 DAYS Performed at [facility], [place] [address] [place], [place] [address]    Report Status 06/24/2019 FINAL  Final  Culture, blood (routine x 2)     Status: None   Collection Time: 06/19/19  1:30 AM   Specimen: BLOOD RIGHT HAND  Result Value Ref Range Status   Specimen Description BLOOD RIGHT HAND  Final   Special Requests   Final    BOTTLES DRAWN AEROBIC AND ANAEROBIC Blood Culture results may not be optimal due to an inadequate volume of blood received in culture bottles   Culture   Final    NO GROWTH 5 DAYS Performed at [facility], [place] [address] [place], [place] [address]    Report Status 06/24/2019 FINAL  Final  Culture, Urine     Status: None   Collection Time: 06/22/19  8:16 AM   Specimen: Urine, Catheterized  Result Value Ref Range Status   Specimen Description URINE, CATHETERIZED  Final   Special Requests NONE  Final   Culture   Final    NO GROWTH Performed at [facility], [address], [place], [place] [address]    Report Status 06/23/2019 FINAL  Final         Radiology Studies: No results found.      Scheduled Meds: . amoxicillin-clavulanate  1 tablet Oral Q12H  . aspirin EC  81 mg Oral Daily  . Chlorhexidine Gluconate Cloth  6 each Topical Daily  . feeding supplement (ENSURE ENLIVE)  237 mL Oral BID BM  . finasteride  5 mg Oral Daily  . insulin aspart  0-9 Units Subcutaneous Q4H  . isosorbide-hydrALAZINE  0.5 tablet Oral TID  . multivitamin with minerals  1 tablet Oral Daily  .  polycarbophil  625 mg Oral Daily  . saccharomyces boulardii  250 mg Oral BID  . sodium chloride flush  10-40 mL Intracatheter Q12H  . sodium chloride flush  3 mL Intravenous Once  . tamsulosin  0.4 mg Oral Daily  . [START ON 06/26/2019] torsemide  40 mg Oral Daily  . warfarin  5 mg Oral  ONCE-1800  . Warfarin - Pharmacist Dosing Inpatient   Does not apply q1800   Continuous Infusions: . DOBUTamine 2.5 mcg/kg/min (06/25/19 1102)  . heparin 1,700 Units/hr (06/25/19 0429)     LOS: 6 days    Time spent: 25 minutes    Myrene Buddy, MD   [facility] 06/25/2019, 11:12 AM     Please page through [place]:  [email or web address] Password [email or web address] If 7PM-7AM, please contact night-coverage

## 2019-06-25 NOTE — Progress Notes (Signed)
[facility]    NEPHROLOGY PROGRESS NOTE  SUBJECTIVE: Patient seen and examined.  Reports feeling generally well.  Denies any appetite disturbance.  Denies chest pain, shortness of breath, nausea or vomiting.  Ambulating in the hall today.  All other review of systems are negative.    OBJECTIVE:  Vitals:   06/25/19 0805 06/25/19 1100  BP: 136/71 (!) 123/59  Pulse: 79 73  Resp: 17 (!) 24  Temp: 97.6 F (36.4 C) 97.6 F (36.4 C)  SpO2: 98% 98%    Intake/Output Summary (Last 24 hours) at 06/25/2019 1607 Last data filed at 06/25/2019 1122 Gross per 24 hour  Intake 220 ml  Output 7400 ml  Net -7180 ml      General:  AAOx3 NAD HEENT: MMM [place] AT anicteric sclera Neck: Minimal JVD, no adenopathy CV:  Heart RRR  Lungs:  L/S CTA bilaterally Abd:  abd SNT/ND with normal BS GU:  Bladder non-palpable Extremities: 1+ bilateral lower extremity edema  skin:  No skin rash  MEDICATIONS:  . amoxicillin-clavulanate  1 tablet Oral Q12H  . aspirin EC  81 mg Oral Daily  . Chlorhexidine Gluconate Cloth  6 each Topical Daily  . feeding supplement (ENSURE ENLIVE)  237 mL Oral BID BM  . finasteride  5 mg Oral Daily  . insulin aspart  0-9 Units Subcutaneous Q4H  . isosorbide-hydrALAZINE  0.5 tablet Oral TID  . multivitamin with minerals  1 tablet Oral Daily  . polycarbophil  625 mg Oral Daily  . saccharomyces boulardii  250 mg Oral BID  . sodium chloride flush  10-40 mL Intracatheter Q12H  . sodium chloride flush  3 mL Intravenous Once  . tamsulosin  0.4 mg Oral Daily  . [START ON 06/26/2019] torsemide  40 mg Oral Daily  . warfarin  5 mg Oral ONCE-1800  . Warfarin - Pharmacist Dosing Inpatient   Does not apply q1800       LABS:   CBC Latest Ref Rng & Units 06/25/2019 06/24/2019 06/22/2019  WBC 4.0 - 10.5 K/uL 6.3 5.7 6.4  Hemoglobin 13.0 - 17.0 g/dL 10.5(L) 9.7(L) 10.0(L)  Hematocrit 39.0 - 52.0 % 30.5(L) 28.3(L) 28.3(L)  Platelets 150 - 400 K/uL 202 180 145(L)     CMP Latest Ref Rng & Units 06/25/2019 06/24/2019 06/23/2019  Glucose 70 - 99 mg/dL 106(H) 90 155(H)  BUN 8 - 23 mg/dL 109(H) 115(H) 120(H)  Creatinine 0.61 - 1.24 mg/dL 4.19(H) 4.85(H) 5.16(H)  Sodium 135 - 145 mmol/L 138 135 136  Potassium 3.5 - 5.1 mmol/L 3.9 3.5 3.9  Chloride 98 - 111 mmol/L 99 100 102  CO2 22 - 32 mmol/L 27 23 23   Calcium 8.9 - 10.3 mg/dL 8.6(L) 8.0(L) 8.0(L)  Total Protein 6.5 - 8.1 g/dL 6.7 6.0(L) 6.4(L)  Total Bilirubin 0.3 - 1.2 mg/dL 6.5(H) 6.7(H) 8.1(H)  Alkaline Phos 38 - 126 U/L 49 45 42  AST 15 - 41 U/L 19 16 15   ALT 0 - 44 U/L 17 15 16     Lab Results  Component Value Date   CALCIUM 8.6 (L) 06/25/2019   CAION 1.14 (L) 09/13/2018   PHOS 5.9 (H) 06/19/2019       Component Value Date/Time   COLORURINE AMBER (A) 06/21/2019 0916   APPEARANCEUR CLOUDY (A) 06/21/2019 0916   LABSPEC 1.020 06/21/2019 0916   PHURINE 5.0 06/21/2019 0916   GLUCOSEU NEGATIVE 06/21/2019 0916   HGBUR MODERATE (A) 06/21/2019 0916   BILIRUBINUR SMALL (A) 06/21/2019 9449   Benjamin Stain  NEGATIVE 06/21/2019 0916   PROTEINUR 30 (A) 06/21/2019 0916   NITRITE NEGATIVE 06/21/2019 0916   LEUKOCYTESUR MODERATE (A) 06/21/2019 0916      Component Value Date/Time   PHART 7.196 (LL) 02/26/2015 0208   PCO2ART 58.7 (HH) 02/26/2015 0208   PO2ART 280.0 (H) 02/26/2015 0208   HCO3 22.7 02/26/2015 0208   TCO2 25 09/13/2018 0604   ACIDBASEDEF 6.0 (H) 02/26/2015 0208   O2SAT 65.5 06/25/2019 0545    No results found for: IRON, TIBC, FERRITIN, IRONPCTSAT     ASSESSMENT/PLAN:     1. CKD stage III-IV.  Baseline serum creatinine around 2-2.2  2. Acute kidney injury.  Likely secondary to hemodynamics.  He has had some urine sediment but overall picture is most consistent with ATN.  No indication for urgent dialysis.  Continue to monitor.  Urine output excellent in the past 24 hours.  Patient has not wanted dialysis but now is telling other physicians that he may be agreeable if needed.  His  conversation with palliative care today suggest that he did not want to be hooked up to machines.  No urgent indication for dialysis currently.  Carvedilol held due to low blood pressures.  Renal ultrasound with increased echogenicity but no obstruction.  Doubt AIN related to Zosyn due to short timeframe of exposure.  3.  CHF -discontinue furosemide. 4.  DM 5.  BPH with chronic indwelling foley; just changed out Monday. 6.  Abd pain secondary to choledocholithiasis and cholangitis, s/p stone removal. 7.  Hyperphosphatemia.  Add phosphate binder.      Ages, DO, [place]

## 2019-06-25 NOTE — Care Management Important Message (Signed)
Important Message  Patient Details  Name: [name] MRN: [id number] Date of Birth: 1941-04-08   Medicare Important Message Given:  Yes     [name] 06/25/2019, 1:54 PM

## 2019-06-25 NOTE — Progress Notes (Addendum)
Advanced Heart Failure Rounding Note   Subjective:    [name] is a 78 year old with a history of combined diastolic/systolic heart failure, diabetes, CKD Stage III, ETOH abuse, HTN, BPH, urinary retention (foley changed monthly),  and hyperlipidemia.  Admitted 10/20 with choledocholithiasis and cholangitis. GI consulted and he underwent ERCP 10/20 with cholangitis, recommendation for IV antibiotics. He had an ECHO that showed severely reduced EF and LV thrombus. Found to have low output HF w/ worsening renal function c/w cardiorenal syndrome and started on dobutamine.   Remains on Dobutamine 5 mcg/kg/min. Co-ox 65%.  Creatinine gradually improving, down from  5.1-> 4.8>>4.19 (baseline 2.5-3.0). Excellent diuresis, w/ an additional 8.2L out yesterday. Wt down 14 lb. CVP measured at 3.   Denies resting dyspnea. No abdominal pain. Tolerating PO ok. Day 8/10 of antibiotics. Asking if UNNA boots can be removed, they are uncomfortable.  Objective:   Weight Range:  Vital Signs:   Temp:  [97.4 F (36.3 C)-97.6 F (36.4 C)] 97.6 F (36.4 C) (10/26 0805) Pulse Rate:  [69-87] 79 (10/26 0805) Resp:  [15-20] 17 (10/26 0805) BP: (114-136)/(63-84) 136/71 (10/26 0805) SpO2:  [96 %-98 %] 98 % (10/26 0805) Weight:  [87.5 kg] 87.5 kg (10/26 0426) Last BM Date: 06/23/19  Weight change: Filed Weights   06/23/19 0319 06/24/19 0358 06/25/19 0426  Weight: 94.6 kg 93.9 kg 87.5 kg    Intake/Output:   Intake/Output Summary (Last 24 hours) at 06/25/2019 0811 Last data filed at 06/25/2019 0804 Gross per 24 hour  Intake 338.15 ml  Output 8300 ml  Net -7961.85 ml     Physical Exam: PHYSICAL EXAM: General:  Well appearing, elderly AAM. No respiratory difficulty HEENT: normal Neck: supple. No JVD. Carotids 2+ bilat; no bruits. No lymphadenopathy or thyromegaly appreciated. Cor: PMI nondisplaced. Regular rate & rhythm. No rubs, gallops or murmurs. Lungs: clear Abdomen: soft, nontender,  nondistended. No hepatosplenomegaly. No bruits or masses. Good bowel sounds. Extremities: no cyanosis, clubbing, rash, trace bilateral LEE edema Neuro: alert & oriented x 3, cranial nerves grossly intact. moves all 4 extremities w/o difficulty. Affect pleasant.   Telemetry: NSR 80s NSVT up to 7 beats Personally reviewed   Labs: Basic Metabolic Panel: Recent Labs  Lab 06/19/19 1413  06/21/19 0138 06/22/19 0401 06/23/19 0530 06/24/19 0627 06/25/19 0441  NA 137   < > 137 137 136 135 138  K 4.1   < > 4.3 4.0 3.9 3.5 3.9  CL 102   < > 101 103 102 100 99  CO2 21*   < > 23 23 23 23 27   GLUCOSE 118*   < > 140* 112* 155* 90 106*  BUN 77*   < > 102* 113* 120* 115* 109*  CREATININE 3.26*   < > 4.09* 4.66* 5.16* 4.85* 4.19*  CALCIUM 8.4*   < > 8.0* 8.0* 8.0* 8.0* 8.6*  MG 2.1  --   --   --   --   --   --   PHOS 5.9*  --   --   --   --   --   --    < > = values in this interval not displayed.    Liver Function Tests: Recent Labs  Lab 06/21/19 0138 06/22/19 0401 06/23/19 0530 06/24/19 0627 06/25/19 0441  AST 20 18 15 16 19   ALT 19 20 16 15 17   ALKPHOS 47 39 42 45 49  BILITOT 12.9* 10.6* 8.1* 6.7* 6.5*  PROT 6.0* 6.3*  6.4* 6.0* 6.7  ALBUMIN 1.8* 1.8* 1.7* 1.7* 1.8*   Recent Labs  Lab 06/18/19 2054  LIPASE 220*   No results for input(s): AMMONIA in the last 168 hours.  CBC: Recent Labs  Lab 06/20/19 0616 06/21/19 0138 06/22/19 0401 06/24/19 0627 06/25/19 0441  WBC 10.5 10.7* 6.4 5.7 6.3  HGB 11.8* 10.4* 10.0* 9.7* 10.5*  HCT 33.7* 31.0* 28.3* 28.3* 30.5*  MCV 84.5 85.2 83.0 83.7 83.6  PLT 184 164 145* 180 202    Cardiac Enzymes: No results for input(s): CKTOTAL, CKMB, CKMBINDEX, TROPONINI in the last 168 hours.  BNP: BNP (last 3 results) Recent Labs    08/16/18 0940 02/04/19 0906 06/18/19 2109  BNP 1,774.3* >4,500.0* 2,536.0*    ProBNP (last 3 results) No results for input(s): PROBNP in the last 8760 hours.    Other results:  Imaging: No  results found.   Medications:     Scheduled Medications: . amoxicillin-clavulanate  1 tablet Oral Q12H  . aspirin EC  81 mg Oral Daily  . Chlorhexidine Gluconate Cloth  6 each Topical Daily  . feeding supplement (ENSURE ENLIVE)  237 mL Oral BID BM  . finasteride  5 mg Oral Daily  . insulin aspart  0-9 Units Subcutaneous Q4H  . multivitamin with minerals  1 tablet Oral Daily  . polycarbophil  625 mg Oral Daily  . saccharomyces boulardii  250 mg Oral BID  . sodium chloride flush  10-40 mL Intracatheter Q12H  . sodium chloride flush  3 mL Intravenous Once  . tamsulosin  0.4 mg Oral Daily    Infusions: . DOBUTamine 5 mcg/kg/min (06/24/19 0952)  . furosemide 160 mg (06/24/19 2128)  . heparin 1,700 Units/hr (06/25/19 0429)    PRN Medications: alum & mag hydroxide-simeth, ondansetron (ZOFRAN) IV, sodium chloride flush   Assessment/Plan:    1. Acute/chronic systolic CHF: low output HF with cardiorenal syndrome with progressive worsening of renal function. Diureses and renal function now improving w/ inotropic therapy .  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 Previously declined cath. He does, of note, have anterior Qs on ECG.  Not a LHC candidate due to baseline renal function. Echo this admission showed EF < 20%, severe LV dilation, small LV apical thrombus, severely dilated and dysfunctional RV with flattened septum. Dobutamine started on 10/23. Co-ox 56%-> 64%>>74%>>65%.  Renal function starting to improve. Urine output markedly improved, 8L out yesterday. CVP 3.  - Stop IV diuretics. May resume PO diuretic tomorrow.  - Cut dobutamine down to 2.5 mcg. - Start bidil 1/2 tablet tid.  - Remove Unna boots. Add TED hoses.  2. AKI: On CKD stage 3.  Initial creatinine 2.8, peaked 5.16. Trending down 4.85>>4.19.  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  Baseline creatinine was 1.5 in 1/20 and 2.5 in 6/20 - Continue to support  cardiac output with dobutamine. Wean down dobutamine to 2.5.    - Hopefully creatinine will continue to improve with inotropic support and as renal venous pressure drops, but I am concerned that we may not be successful enough.  Dialysis would be very difficult for him with severe biventricular failure.  - Renal has seen. Patient initially refused short-term HD but now seems to be willing to consider. He is not candidate for long-term HD and given severe RV function likely not a candidate for VAD so long-term options limited to palliative inotropes or hospice so not sure if offering even short-term HD  is reasonable. He lives alone at home and I am not sure he will be able to return there 3. 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  - No bleeding.  - Need to consider starting warfarin but not sure he can manage this. May be better suited with DOAC.  4. Ascending cholangitis: S/p ERCP and stone removal.  Now afebrile with normal WBCs.  - Continue Augmentin per primary service.  5. Hyperbilirubinemia: Likely due to combination of CBD obstruction (now relieved) and RV failure with hepatic congestion. total bili trending down, 10.6>>8.1>>6.7>>6.1 6. Hypokalemia, stable today at 3.9 - supp as needed 7. [place] - He had meeting with Palliative Care and Dr. Loleta Books on 10/24. He is now DNR but would like aggressive care up to and including VAD support if he would qualify but CKD and RV failure likely prohibit VAD and thus long-term options limited to hospice vs palliative home inotropes.   Length of Stay: [place] MD 06/25/2019, 8:11 AM  Advanced Heart Failure Team Pager [phone] (M-F; [name])  Please contact [facility] for night-coverage after hours (4p -7a ) and weekends on [email or web address]  Patient seen with PA, agree with the above note.   Creatinine trending down, excellent UOP yesterday.  CVP now 3, co-ox 65%.  Breathing much improved.   General: NAD Neck: No JVD, no thyromegaly or thyroid  nodule.  Lungs: Clear to auscultation bilaterally with normal respiratory effort. CV: Lateral PMI.  Heart regular S1/S2, no S3/S4, no murmur.  No peripheral edema.   Abdomen: Soft, nontender, no hepatosplenomegaly, no distention.  Skin: Intact without lesions or rashes.  Neurologic: Alert and oriented x 3.  Psych: Normal affect. Extremities: No clubbing or cyanosis.  HEENT: Normal.   Volume status much improved, good cardiac output by co-ox on dobutamine 5.  - Stop IV Lasix, can start po torsemide probably 80 mg daily tomorrow.  - Decrease dobutamine to 2.5.  - Add Bidil 1/2 tab tid.   With LV thrombus, continue heparin gtt and will start on warfarin.   Renal indices improving though BUN/creatinine still very high.  Would likely be poor HD candidate but he is open to trial if needed, hopefully can avoid at this point.   Mobilize.   Loralie Champagne 06/25/2019 8:54 AM

## 2019-06-25 NOTE — Evaluation (Signed)
Physical Therapy Evaluation Patient Details Name: [name] MRN: [id number] DOB: [birth year] Today's Date: 06/25/2019   History of Present Illness  [name] is a 78 y.o. M with HTN, sCHF EF 25%, CKD III baseline Cr 2, DM, PVD and alcohol use who presented with 4-5 days progressive abdominal pain with vomiting.  Found to have choledocholithiasis and underwent ERCP.  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  Clinical Impression  Patient presents with decreased independence with mobility due to weakness and deconditioning.  Currently needs min-guard to min A for mobility and previously lived alone with help for transportation for getting his groceries.  He states his son can come stay with him from [place] area.  If so could plan for home with HHPT, but if not, would need STSNF level rehab prior to d/c home.  PT to follow acutely.     Follow Up Recommendations Home health PT;Supervision/Assistance - 24 hour    Equipment Recommendations  None recommended by PT    Recommendations for Other Services       Precautions / Restrictions Precautions Precautions: Fall Restrictions Weight Bearing Restrictions: No      Mobility  Bed Mobility Overal bed mobility: Needs Assistance Bed Mobility: Sit to Supine       Sit to supine: Min assist   General bed mobility comments: for positioning and one leg onto bed  Transfers Overall transfer level: Needs assistance Equipment used: Rolling walker (2 wheeled) Transfers: Sit to/from Stand Sit to Stand: Min assist         General transfer comment: up from recliner with assist for balance, increased time and heavy UE support on armrests to lift himself up  Ambulation/Gait Ambulation/Gait assistance: Min guard Gait Distance (Feet): 130 Feet Assistive device: Rolling walker (2 wheeled) Gait Pattern/deviations: Step-through pattern;Step-to pattern;Decreased stride length;Narrow base of support     General Gait Details:  relatively stable with RW, assist for safety/balance. self limited distance due to fatigue  Stairs            Wheelchair Mobility    Modified Rankin (Stroke Patients Only)       Balance Overall balance assessment: Needs assistance   Sitting balance-Leahy Scale: Good     Standing balance support: Bilateral upper extremity supported;Single extremity supported Standing balance-Leahy Scale: Poor Standing balance comment: UE support needed for balance                             Pertinent Vitals/Pain Pain Assessment: Faces Faces Pain Scale: Hurts little more Pain Location: legs with neuropathy Pain Descriptors / Indicators: Numbness;Restless Pain Intervention(s): Monitored during session;Repositioned    Home Living Family/patient expects to be discharged to:: Private residence Living Arrangements: Alone Available Help at Discharge: Family(checking if his son from [place] can come stay with him for a bit) Type of Home: Apartment Home Access: Level entry       Home Equipment: Walker - 4 wheels;Cane - single point      Prior Function Level of Independence: Independent with assistive device(s)         Comments: used SPC vs rollator for mobility, independent ADLs/IADLs (not driving)     Hand Dominance        Extremity/Trunk Assessment        Lower Extremity Assessment Lower Extremity Assessment: Generalized weakness       Communication   Communication: No difficulties  Cognition Arousal/Alertness: Awake/alert Behavior During Therapy: WFL for  tasks assessed/performed Overall Cognitive Status: Within Functional Limits for tasks assessed                                        General Comments General comments (skin integrity, edema, etc.): legs dark and thin after unna boot removal    Exercises     Assessment/Plan    PT Assessment Patient needs continued PT services  PT Problem List Decreased strength;Decreased activity  tolerance;Decreased mobility;Decreased balance;Decreased knowledge of use of DME;Cardiopulmonary status limiting activity       PT Treatment Interventions DME instruction;Therapeutic activities;Balance training;Gait training;Functional mobility training;Therapeutic exercise;Patient/family education    PT Goals (Current goals can be found in the Care Plan section)  Acute Rehab PT Goals PT Goal Formulation: With patient/family Time For Goal Achievement: 07/09/19 Potential to Achieve Goals: Fair    Frequency Min 3X/week   Barriers to discharge        Co-evaluation               AM-PAC PT "6 Clicks" Mobility  Outcome Measure Help needed turning from your back to your side while in a flat bed without using bedrails?: A Little Help needed moving from lying on your back to sitting on the side of a flat bed without using bedrails?: A Little Help needed moving to and from a bed to a chair (including a wheelchair)?: A Little Help needed standing up from a chair using your arms (e.g., wheelchair or bedside chair)?: A Little Help needed to walk in hospital room?: A Little Help needed climbing 3-5 steps with a railing? : A Little 6 Click Score: 18    End of Session Equipment Utilized During Treatment: Oxygen Activity Tolerance: Patient tolerated treatment well Patient left: in bed;with call bell/phone within reach   PT Visit Diagnosis: Other abnormalities of gait and mobility (R26.89);Muscle weakness (generalized) (M62.81)    Time: 4136-4383 PT Time Calculation (min) (ACUTE ONLY): 25 min   Charges:   PT Evaluation $PT Eval Moderate Complexity: 1 Mod PT Treatments $Gait Training: 8-22 mins        Magda Kiel, [place] [facility] [phone] 06/25/2019   Reginia Naas 06/25/2019, 5:24 PM

## 2019-06-25 NOTE — Progress Notes (Addendum)
ANTICOAGULATION CONSULT NOTE  Pharmacy Consult for Heparin Indication: LV thrombus  No Known Allergies  Patient Measurements: Height: 5\' 11"  (180.3 cm) Weight: 193 lb (87.5 kg) IBW/kg (Calculated) : 75.3  Heparin dosing weight: 90 kg  Vital Signs: Temp: 97.6 F (36.4 C) (10/26 0805) Temp Source: Oral (10/26 0805) BP: 136/71 (10/26 0805) Pulse Rate: 79 (10/26 0805)  Labs: Recent Labs    06/23/19 0530 06/23/19 1434 06/24/19 0428 06/24/19 0627 06/25/19 0441 06/25/19 0442  HGB  --   --   --  9.7* 10.5*  --   HCT  --   --   --  28.3* 30.5*  --   PLT  --   --   --  180 202  --   HEPARINUNFRC 0.27* 0.27* 0.33  --   --  0.51  CREATININE 5.16*  --   --  4.85* 4.19*  --     Estimated Creatinine Clearance: 16.2 mL/min (A) (by C-G formula based on SCr of 4.19 mg/dL (H)).  Assessment: 78 y.o. 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  Heparin level 0.51 at goal this AM, no overt bleeding or complications noted, CBC stable.  No overt bleeding or complications noted.  Will start warfarin today as planned procedures are complete and hopefully renal function will continue to improve and no need for HD.   Goal of Therapy:  Heparin level 0.3-0.7 units/ml  INR 2-3 Monitor platelets by anticoagulation protocol: Yes   Plan:  Continue [id number] units/hr. Warfarin 5mg  x1 today Daily heparin level, Protime and CBC  Bonnita Nasuti Pharm.D. CPP, BCPS Clinical Pharmacist [phone] 06/25/2019 8:47 AM

## 2019-06-25 NOTE — Progress Notes (Signed)
Came to ambulate however pt in bed and sts he just walked with PT. Will f/u tomorrow. Yves Dill CES, ACSM 2:39 PM 06/25/2019

## 2019-06-26 DIAGNOSIS — I5043 Acute on chronic combined systolic (congestive) and diastolic (congestive) heart failure: Secondary | ICD-10-CM | POA: Diagnosis not present

## 2019-06-26 DIAGNOSIS — N179 Acute kidney failure, unspecified: Secondary | ICD-10-CM | POA: Diagnosis not present

## 2019-06-26 LAB — CBC
HCT: 28.9 % — ABNORMAL LOW (ref 39.0–52.0)
Hemoglobin: 9.6 g/dL — ABNORMAL LOW (ref 13.0–17.0)
MCH: 28.5 pg (ref 26.0–34.0)
MCHC: 33.2 g/dL (ref 30.0–36.0)
MCV: 85.8 fL (ref 80.0–100.0)
Platelets: 223 10*3/uL (ref 150–400)
RBC: 3.37 MIL/uL — ABNORMAL LOW (ref 4.22–5.81)
RDW: 18.6 % — ABNORMAL HIGH (ref 11.5–15.5)
WBC: 7.5 10*3/uL (ref 4.0–10.5)
nRBC: 0.4 % — ABNORMAL HIGH (ref 0.0–0.2)

## 2019-06-26 LAB — COMPREHENSIVE METABOLIC PANEL
ALT: 18 U/L (ref 0–44)
AST: 22 U/L (ref 15–41)
Albumin: 1.7 g/dL — ABNORMAL LOW (ref 3.5–5.0)
Alkaline Phosphatase: 48 U/L (ref 38–126)
Anion gap: 10 (ref 5–15)
BUN: 99 mg/dL — ABNORMAL HIGH (ref 8–23)
CO2: 31 mmol/L (ref 22–32)
Calcium: 8.5 mg/dL — ABNORMAL LOW (ref 8.9–10.3)
Chloride: 98 mmol/L (ref 98–111)
Creatinine, Ser: 3.39 mg/dL — ABNORMAL HIGH (ref 0.61–1.24)
GFR calc Af Amer: 19 mL/min — ABNORMAL LOW (ref >60)
GFR calc non Af Amer: 17 mL/min — ABNORMAL LOW (ref >60)
Glucose, Bld: 129 mg/dL — ABNORMAL HIGH (ref 70–99)
Potassium: 4 mmol/L (ref 3.5–5.1)
Sodium: 139 mmol/L (ref 135–145)
Total Bilirubin: 5.6 mg/dL — ABNORMAL HIGH (ref 0.3–1.2)
Total Protein: 6.4 g/dL — ABNORMAL LOW (ref 6.5–8.1)

## 2019-06-26 LAB — GLUCOSE, CAPILLARY
Glucose-Capillary: 105 mg/dL — ABNORMAL HIGH (ref 70–99)
Glucose-Capillary: 112 mg/dL — ABNORMAL HIGH (ref 70–99)
Glucose-Capillary: 121 mg/dL — ABNORMAL HIGH (ref 70–99)
Glucose-Capillary: 133 mg/dL — ABNORMAL HIGH (ref 70–99)
Glucose-Capillary: 135 mg/dL — ABNORMAL HIGH (ref 70–99)
Glucose-Capillary: 149 mg/dL — ABNORMAL HIGH (ref 70–99)
Glucose-Capillary: 176 mg/dL — ABNORMAL HIGH (ref 70–99)

## 2019-06-26 LAB — HEPARIN LEVEL (UNFRACTIONATED): Heparin Unfractionated: 0.34 IU/mL (ref 0.30–0.70)

## 2019-06-26 LAB — PROTIME-INR
INR: 1.3 — ABNORMAL HIGH (ref 0.8–1.2)
Prothrombin Time: 16.2 seconds — ABNORMAL HIGH (ref 11.4–15.2)

## 2019-06-26 LAB — COOXEMETRY PANEL
Carboxyhemoglobin: 3.5 % — ABNORMAL HIGH (ref 0.5–1.5)
Methemoglobin: 1 % (ref 0.0–1.5)
O2 Saturation: 78 %
Total hemoglobin: 10.4 g/dL — ABNORMAL LOW (ref 12.0–16.0)

## 2019-06-26 MED ORDER — TORSEMIDE 20 MG PO TABS
80.0000 mg | ORAL_TABLET | Freq: Every day | ORAL | Status: DC
  Administered 2019-06-26 – 2019-06-27 (×2): 80 mg via ORAL
  Filled 2019-06-26 (×2): qty 4

## 2019-06-26 MED ORDER — ISOSORB DINITRATE-HYDRALAZINE 20-37.5 MG PO TABS
1.0000 | ORAL_TABLET | Freq: Three times a day (TID) | ORAL | Status: DC
  Administered 2019-06-26 – 2019-06-27 (×3): 1 via ORAL
  Filled 2019-06-26 (×3): qty 1

## 2019-06-26 MED ORDER — WARFARIN SODIUM 5 MG PO TABS
5.0000 mg | ORAL_TABLET | Freq: Once | ORAL | Status: AC
  Administered 2019-06-26: 5 mg via ORAL
  Filled 2019-06-26: qty 1

## 2019-06-26 NOTE — Progress Notes (Signed)
CARDIAC REHAB PHASE I   PRE:  Rate/Rhythm: 84 SR    BP: sitting 121/63    SaO2: 98 RA  MODE:  Ambulation: 100 ft   POST:  Rate/Rhythm: 107 ST    BP: sitting 119/60     SaO2: 97 RA  Pt eager to walk. Moved slowly to EOB, being careful due to swollen genitals. Able to slowly stand with raised bed. Steady with RW, slow pace. Used x2 assist for safety. No major c/o but decreased distance. To recliner.  Pleasant. Will f/u as time allows. [id number]  [place], ACSM 06/26/2019 11:23 AM

## 2019-06-26 NOTE — Progress Notes (Signed)
Followed up with the AD signing. PT was alert and talking. I was able to set up time to get notary and witnesses. Document is complete and sitting in his desk and I made copy and put in his file.  [name] was thankful and talking about his life of traveling the [place]. 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 I offered spiritual care with words of encouragement, and ministry of presence.   Palliative Care Chaplain Resident Fidel Levy [phone]

## 2019-06-26 NOTE — Progress Notes (Signed)
Nutrition Follow up  DOCUMENTATION CODES:   Not applicable  INTERVENTION:   -Continue Ensure Enlive po TID, each supplement provides 350 kcal and 20 grams of protein -Continue MVI with minerals daily -Continue Magic cup TID with meals, each supplement provides 290 kcal and 9 grams of protein  NUTRITION DIAGNOSIS:   Inadequate oral intake related to altered GI function as evidenced by meal completion < 25%.  Ongoing  GOAL:   Patient will meet greater than or equal to 90% of their needs   Progressing   MONITOR:   Supplement acceptance, Diet advancement, PO intake, Labs, Weight trends, Skin, I & O's  REASON FOR ASSESSMENT:   Malnutrition Screening Tool    ASSESSMENT:   [name]  is a 78 y.o. 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 Constant,  Pt is not sure if worse with food. Pt had n/v x1, but no blood.  Pt denies fever, chills, cough, cp, palp, sob, diarrhea, constipation, brbpr, black stool, dysuria, hematuria.  Pt has chronic catheter due to ? Bph.  Pt presented due to worsening abdominal pain. Pt denies etoh use for the past 4 months.  10/20- s/p ERCP- revealed choledocholithiasis and purulent cholangitis founds and s/p biliary sphincterotomy and balloon sweeping 10/21- s/p BSE- recommend regular diet with thin liquids  Unable to reach pt via phone. RN reports pt's appetite remain decreased. Finishing less than half each time. RN unsure if pt drinking Ensure (none charted in flowsheet). RN to encourage Ensure today. Will continue to monitor intake and supplement acceptance.   Admission weight: 90 kg  Current weight: 82.1 kg   I/O: -17,624 ml since admit UOP: 7,950 ml x 24 hrs   Drips: dobutamine Medications: SS novolog, MVI with minerals, fibercon,  renvela, 80 mg demadex daily  Labs: CBG 105-132  Diet Order:   Diet Order            Diet Heart Room service appropriate? No; Fluid consistency: Thin  Diet effective now              EDUCATION NEEDS:   Education needs have been addressed  Skin:  Skin Assessment: Reviewed RN Assessment  Last BM:  10/26  Height:   Ht Readings from Last 1 Encounters:  06/19/19 5\' 11"  (1.803 m)    Weight:   Wt Readings from Last 1 Encounters:  06/26/19 82.1 kg    Ideal Body Weight:  78.2 kg  BMI:  Body mass index is 25.24 kg/m.  Estimated Nutritional Needs:   Kcal:  2050-2250  Protein:  95-110 grams  Fluid:  2 L   Mariana Single RD, LDN Clinical Nutrition Pager # [phone]

## 2019-06-26 NOTE — Progress Notes (Addendum)
Advanced Heart Failure Rounding Note   Subjective:    [name] is a 78 year old with a history of combined diastolic/systolic heart failure, diabetes, CKD Stage III, ETOH abuse, HTN, BPH, urinary retention (foley changed monthly),  and hyperlipidemia.  Admitted 10/20 with choledocholithiasis and cholangitis. GI consulted and he underwent ERCP 10/20 with cholangitis, recommendation for IV antibiotics. He had an ECHO that showed severely reduced EF and LV thrombus. Found to have low output HF w/ worsening renal function c/w cardiorenal syndrome and started on dobutamine.   Remains on Dobutamine, but titrated down to 2.5 mcg/kg/min. Co-ox 78%.  Creatinine continues to improve, down from  5.1-> 4.8>>4.19>>3.39 (baseline 2.5-3.0). Excellent diuresis, w/ an additional 7.9.L out yesterday. Wt down an additional 12 lb. CVP measured at 8. Currently on bedside commode having a BM.    Objective:   Weight Range:  Vital Signs:   Temp:  [97.5 F (36.4 C)-98.2 F (36.8 C)] 97.8 F (36.6 C) (10/27 0752) Pulse Rate:  [72-95] 95 (10/27 0752) Resp:  [15-24] 22 (10/27 0752) BP: (112-126)/(59-70) 126/70 (10/27 0752) SpO2:  [97 %-99 %] 99 % (10/27 0752) Weight:  [82.1 kg] 82.1 kg (10/27 0450) Last BM Date: 06/25/19  Weight change: Filed Weights   06/24/19 0358 06/25/19 0426 06/26/19 0450  Weight: 93.9 kg 87.5 kg 82.1 kg    Intake/Output:   Intake/Output Summary (Last 24 hours) at 06/26/2019 0910 Last data filed at 06/26/2019 0829 Gross per 24 hour  Intake 1596.31 ml  Output 7175 ml  Net -5578.69 ml     Physical Exam: PHYSICAL EXAM: General:  Well appearing. No respiratory difficulty HEENT: normal Neck: supple. no JVD. Carotids 2+ bilat; no bruits. No lymphadenopathy or thyromegaly appreciated. Cor: PMI nondisplaced. Regular rate & rhythm. No rubs, gallops or murmurs. Lungs: clear Abdomen: soft, nontender, nondistended. No hepatosplenomegaly. No bruits or masses. Good bowel sounds.  Extremities: no cyanosis, clubbing, rash, edema Neuro: alert & oriented x 3, cranial nerves grossly intact. moves all 4 extremities w/o difficulty. Affect pleasant.   Telemetry: NSR 90s 1 run of NSVT up to 4 beats Personally reviewed   Labs: Basic Metabolic Panel: Recent Labs  Lab 06/19/19 1413  06/22/19 0401 06/23/19 0530 06/24/19 0627 06/25/19 0441 06/26/19 0430  NA 137   < > 137 136 135 138 139  K 4.1   < > 4.0 3.9 3.5 3.9 4.0  CL 102   < > 103 102 100 99 98  CO2 21*   < > 23 23 23 27 31   GLUCOSE 118*   < > 112* 155* 90 106* 129*  BUN 77*   < > 113* 120* 115* 109* 99*  CREATININE 3.26*   < > 4.66* 5.16* 4.85* 4.19* 3.39*  CALCIUM 8.4*   < > 8.0* 8.0* 8.0* 8.6* 8.5*  MG 2.1  --   --   --   --   --   --   PHOS 5.9*  --   --   --   --   --   --    < > = values in this interval not displayed.    Liver Function Tests: Recent Labs  Lab 06/22/19 0401 06/23/19 0530 06/24/19 0627 06/25/19 0441 06/26/19 0430  AST 18 15 16 19 22   ALT 20 16 15 17 18   ALKPHOS 39 42 45 49 48  BILITOT 10.6* 8.1* 6.7* 6.5* 5.6*  PROT 6.3* 6.4* 6.0* 6.7 6.4*  ALBUMIN 1.8* 1.7* 1.7* 1.8* 1.7*  No results for input(s): LIPASE, AMYLASE in the last 168 hours. No results for input(s): AMMONIA in the last 168 hours.  CBC: Recent Labs  Lab 06/21/19 0138 06/22/19 0401 06/24/19 0627 06/25/19 0441 06/26/19 0430  WBC 10.7* 6.4 5.7 6.3 7.5  HGB 10.4* 10.0* 9.7* 10.5* 9.6*  HCT 31.0* 28.3* 28.3* 30.5* 28.9*  MCV 85.2 83.0 83.7 83.6 85.8  PLT 164 145* 180 202 223    Cardiac Enzymes: No results for input(s): CKTOTAL, CKMB, CKMBINDEX, TROPONINI in the last 168 hours.  BNP: BNP (last 3 results) Recent Labs    08/16/18 0940 02/04/19 0906 06/18/19 2109  BNP 1,774.3* >4,500.0* 2,536.0*    ProBNP (last 3 results) No results for input(s): PROBNP in the last 8760 hours.    Other results:  Imaging: No results found.   Medications:     Scheduled Medications: .  amoxicillin-clavulanate  1 tablet Oral Q12H  . aspirin EC  81 mg Oral Daily  . Chlorhexidine Gluconate Cloth  6 each Topical Daily  . feeding supplement (ENSURE ENLIVE)  237 mL Oral BID BM  . finasteride  5 mg Oral Daily  . insulin aspart  0-9 Units Subcutaneous Q4H  . isosorbide-hydrALAZINE  0.5 tablet Oral TID  . multivitamin with minerals  1 tablet Oral Daily  . polycarbophil  625 mg Oral Daily  . saccharomyces boulardii  250 mg Oral BID  . sevelamer carbonate  800 mg Oral TID WC  . sodium chloride flush  10-40 mL Intracatheter Q12H  . sodium chloride flush  3 mL Intravenous Once  . tamsulosin  0.4 mg Oral Daily  . torsemide  40 mg Oral Daily  . Warfarin - Pharmacist Dosing Inpatient   Does not apply q1800    Infusions: . DOBUTamine 2.5 mcg/kg/min (06/25/19 1102)  . heparin 1,700 Units/hr (06/26/19 0620)    PRN Medications: alum & mag hydroxide-simeth, ondansetron (ZOFRAN) IV, sodium chloride flush   Assessment/Plan:    1. Acute/chronic systolic CHF: low output HF with cardiorenal syndrome with progressive worsening of renal function. Diureses and renal function now improving w/ inotropic therapy .  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 Previously declined cath. He does, of note, have anterior Qs on ECG.  Not a LHC candidate due to baseline renal function. Echo this admission showed EF < 20%, severe LV dilation, small LV apical thrombus, severely dilated and dysfunctional RV with flattened septum. Dobutamine started on 10/23. Co-ox 56%-> 64%>>74%>>65%>>78%.  Renal function improving. Excellent UOP w/ an additional 7.9.L out yesterday. Wt down an additional 12 lb. CVP measured at 8. - IV diuretics discontinued yesterday. Resume PO diuretic today, torsemide 80 mg.  - Continue to wean dobutamine, currently at 2.5 mcg. - Continue bidil 1/2 tablet tid.  -No ARB/ARNI or digoxin due to renal function.  - Continue TED hoses.  -Continue to  follow UOP, CVP and renal function w/ PO diuretic 2. AKI: On CKD stage 3.  Initial creatinine 2.8, peaked 5.16. Trending down 4.85>>4.19>>3.39.  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  Baseline creatinine was 1.5 in 1/20 and 2.5 in 6/20 - Continue to support cardiac output with dobutamine. Will try to wean down.     - Hopefully creatinine will continue to improve with inotropic support and as renal venous pressure drops, but I am concerned that we may not be successful enough.  Dialysis would be very difficult for him with severe biventricular failure.  - Renal has  seen. Patient initially refused short-term HD but now seems to be willing to consider. He is not candidate for long-term HD and given severe RV function likely not a candidate for VAD so long-term options limited to palliative inotropes or hospice so not sure if offering even short-term HD is reasonable. He lives alone at home and I am not sure he will be able to return there 3. LV thrombus: warfarin started 10/27. Bridge w/ IV heparin until INR therapeutic. INR 1.3 today - Monitor H/H 4. Ascending cholangitis: S/p ERCP and stone removal.  Now afebrile with normal WBCs.  - Continue Augmentin per primary service.  5. Hyperbilirubinemia: Likely due to combination of CBD obstruction (now relieved) and RV failure with hepatic congestion. total bili trending down, 10.6>>8.1>>6.7>>6.1>>5.6 6. Hypokalemia: resolved K stable today at 4.0 - supp as needed 7. [place] - He had meeting with Palliative Care and Dr. Loleta Books on 10/24. He is now DNR but would like aggressive care up to and including VAD support if he would qualify but CKD and RV failure likely prohibit VAD and thus long-term options limited to hospice vs palliative home inotropes.   Length of Stay: [place] MD 06/26/2019, 9:10 AM  Advanced Heart Failure Team Pager [phone] (M-F; [place])  Please contact [facility] for night-coverage after hours (4p -7a )  and weekends on [email or web address]   [name] 06/26/2019 9:10 AM  Patient seen with PA, agree with the above note.   He walked today, breathing is getting better.  Weight down another 12 lbs with excellent UOP. CVP 8 today with co-ox 78%.   General: NAD Neck: JVP 8 cm, no thyromegaly or thyroid nodule.  Lungs: Clear to auscultation bilaterally with normal respiratory effort. CV: Lateral PMI.  Heart regular S1/S2, no S3/S4, 2/6 HSM apex. Trace ankle edema.  Abdomen: Soft, nontender, no hepatosplenomegaly, no distention.  Skin: Intact without lesions or rashes.  Neurologic: Alert and oriented x 3.  Psych: Normal affect. Extremities: No clubbing or cyanosis.  HEENT: Normal.   Patient is gradually improving.  Creatinine down to 3.39. Co-ox 78%.  CVP 8.  - Decrease dobutamine to 1 today, hopefully off tomorrow.  - Torsemide 80 mg daily.  - Increase Bidil to 1 tab tid.  Will need to make sure he can get Bidil at home.   Continue heparin overlapping with warfarin until INR 2 or above with LV thrombus.   [name] 06/26/2019 1:18 PM

## 2019-06-26 NOTE — Progress Notes (Signed)
ANTICOAGULATION CONSULT NOTE  Pharmacy Consult for Heparin > warfarin Indication: LV thrombus  No Known Allergies  Patient Measurements: Height: 5\' 11"  (180.3 cm) Weight: 181 lb (82.1 kg) IBW/kg (Calculated) : 75.3  Heparin dosing weight: 90 kg  Vital Signs: Temp: 97.8 F (36.6 C) (10/27 0752) Temp Source: Oral (10/27 0752) BP: 126/70 (10/27 0752) Pulse Rate: 95 (10/27 0752)  Labs: Recent Labs    06/24/19 0428  06/24/19 0627 06/25/19 0441 06/25/19 0442 06/26/19 0430  HGB  --    < > 9.7* 10.5*  --  9.6*  HCT  --   --  28.3* 30.5*  --  28.9*  PLT  --   --  180 202  --  223  LABPROT  --   --   --   --   --  16.2*  INR  --   --   --   --   --  1.3*  HEPARINUNFRC 0.33  --   --   --  0.51 0.34  CREATININE  --   --  4.85* 4.19*  --  3.39*   < > = values in this interval not displayed.    Estimated Creatinine Clearance: 20.1 mL/min (A) (by C-G formula based on SCr of 3.39 mg/dL (H)).  Assessment: 78 y.o. 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  Heparin level 0.34 at goal this AM, no overt bleeding or complications noted, h/h slight drop from yesterday will watch pltc stable.   Warfarin started 10/26- planned procedures are complete and hopefully renal function will continue to improve and no need for HD. INR 1.3   Goal of Therapy:  Heparin level 0.3-0.7 units/ml  INR 2-3 Monitor platelets by anticoagulation protocol: Yes   Plan:  Continue [id number] units/hr. Warfarin 5mg  x1 today - repeat Daily heparin level, Protime and CBC  Bonnita Nasuti Pharm.D. CPP, BCPS Clinical Pharmacist [phone] 06/26/2019 9:20 AM

## 2019-06-26 NOTE — Progress Notes (Signed)
PROGRESS NOTE    Pryce Folts  BSJ:[id number] DOB: 05/31/1941 DOA: 06/18/2019 PCP: [name], MD      Brief Narrative:  [name] is a 78 y.o. M with HTN, sCHF EF 25%, CKD III baseline Cr 2 and alcohol use who presented with 4-5 days progressive abdominal pain with vomiting.  In the ER, US abdomen showed gallstones, was admitted for possible cholecystitis.  Found to have biventricular heart failure.  Treated with dobutamine and lasix.        Assessment & Plan:  Acute Cardiogenic Shock Decompensated Biventricular Failure LV thrombus Patient initially started on Lasix, Cardiology consulted.  Subsequently, renal function worsened, diuretics held, started on fluids.  The echocardiogram obtained which showed EF <20% (down from baseline 25%) and signs of decompensated biventricular failure.  -Per HF service   Acute renal failure on CKD stage III Improving  Acute choledocholithiasis and cholangitis -Complete Augmentin  Diarrhea Resolved   Ileus Resolved  Diabetes Glucose good -Continue SS insulin corrections  BPH / Urinary Retention  Chronic indwelling foley  -Continue flomax, finasteride       DVT prophylaxis: On systemic anticoagulation Code Status: DNR Family Communication:  Sister    Consultants:   Cardiology  Gastroenterology  Nephrology  Palliative care   Procedures:   10/20 ERCP -- stones, purulent cholangitis  10/21 CT abdomen -- mild partial ileus  10/22 US renal  10/23 echo LVEF 10-15%.  Dilated LV.  RV moderately reduced systolic function and dilated.  IV septum flattened in diastole indicating volume overload.  L pleural effusion.  Apical thrombus.    10/23 PICC placement  Antimicrobials:   Zosyn 10/19 >> 10/22  Augmentin 10/23 >>  Culture data:   10/19 COVID >> negative   10/19 urine culture >> klebsiella pneumoniae >> S-ceftriaxone, bactrim, zosyn  10/20 blood culture x2 -- pending / NGTD >>  10/23  urine culture >> negative     Subjective: He is feeling well, swelling resolved.  No fever, right upper quadrant pain, jaundice, vomiting.         Objective: Vitals:   06/26/19 0752 06/26/19 1149 06/26/19 1535 06/26/19 1646  BP: 126/70 119/60 (!) 108/55 124/72  Pulse: 95 87  61  Resp: (!) 22 (!) 26 (!) 24 (!) 25  Temp: 97.8 F (36.6 C) (!) 97.4 F (36.3 C)  (!) 97.2 F (36.2 C)  TempSrc: Oral Oral  Oral  SpO2: 99% 98%  98%  Weight:      Height:        Intake/Output Summary (Last 24 hours) at 06/26/2019 1846 Last data filed at 06/26/2019 1653 Gross per 24 hour  Intake 1376.31 ml  Output 6075 ml  Net -4698.69 ml   Filed Weights   06/24/19 0358 06/25/19 0426 06/26/19 0450  Weight: 93.9 kg 87.5 kg 82.1 kg    Examination: General appearance: Adult male, sitting in recliner, no acute distress HEENT:   Skin:     Cardiac: RRR, no murmurs, no lower extremity edema Respiratory: Respirations even and unlabored, lungs clear, good air entry, no rales or wheezes. Abdomen: Abdomen soft without tenderness palpation, guarding, distention, or ascites. MSK: Diffuse loss of subcutaneous muscle mass and fat, thenar wasting. Neuro: Awake and alert, extraocular movements intact, moves all extremities with normal strength and coordination, speech fluent. Psych: Attention normal, affect normal, judgment insight normal.      Data Reviewed: I have personally reviewed following labs and imaging studies:  CBC: Recent Labs  Lab 06/21/19 0138  06/22/19 0401 06/24/19 0627 06/25/19 0441 06/26/19 0430  WBC 10.7* 6.4 5.7 6.3 7.5  HGB 10.4* 10.0* 9.7* 10.5* 9.6*  HCT 31.0* 28.3* 28.3* 30.5* 28.9*  MCV 85.2 83.0 83.7 83.6 85.8  PLT 164 145* 180 202 976   Basic Metabolic Panel: Recent Labs  Lab 06/22/19 0401 06/23/19 0530 06/24/19 0627 06/25/19 0441 06/26/19 0430  NA 137 136 135 138 139  K 4.0 3.9 3.5 3.9 4.0  CL 103 102 100 99 98  CO2 23 23 23 27 31   GLUCOSE 112* 155* 90  106* 129*  BUN 113* 120* 115* 109* 99*  CREATININE 4.66* 5.16* 4.85* 4.19* 3.39*  CALCIUM 8.0* 8.0* 8.0* 8.6* 8.5*   GFR: Estimated Creatinine Clearance: 20.1 mL/min (A) (by C-G formula based on SCr of 3.39 mg/dL (H)). Liver Function Tests: Recent Labs  Lab 06/22/19 0401 06/23/19 0530 06/24/19 0627 06/25/19 0441 06/26/19 0430  AST 18 15 16 19 22   ALT 20 16 15 17 18   ALKPHOS 39 42 45 49 48  BILITOT 10.6* 8.1* 6.7* 6.5* 5.6*  PROT 6.3* 6.4* 6.0* 6.7 6.4*  ALBUMIN 1.8* 1.7* 1.7* 1.8* 1.7*   No results for input(s): LIPASE, AMYLASE in the last 168 hours. No results for input(s): AMMONIA in the last 168 hours. Coagulation Profile: Recent Labs  Lab 06/26/19 0430  INR 1.3*   Cardiac Enzymes: No results for input(s): CKTOTAL, CKMB, CKMBINDEX, TROPONINI in the last 168 hours. BNP (last 3 results) No results for input(s): PROBNP in the last 8760 hours. HbA1C: No results for input(s): HGBA1C in the last 72 hours. CBG: Recent Labs  Lab 06/26/19 0009 06/26/19 0452 06/26/19 0749 06/26/19 1158 06/26/19 1648  GLUCAP 121* 112* 105* 176* 135*   Lipid Profile: No results for input(s): CHOL, HDL, LDLCALC, TRIG, CHOLHDL, LDLDIRECT in the last 72 hours. Thyroid Function Tests: No results for input(s): TSH, T4TOTAL, FREET4, T3FREE, THYROIDAB in the last 72 hours. Anemia Panel: No results for input(s): VITAMINB12, FOLATE, FERRITIN, TIBC, IRON, RETICCTPCT in the last 72 hours. Urine analysis:    Component Value Date/Time   COLORURINE AMBER (A) 06/21/2019 0916   APPEARANCEUR CLOUDY (A) 06/21/2019 0916   LABSPEC 1.020 06/21/2019 0916   PHURINE 5.0 06/21/2019 0916   GLUCOSEU NEGATIVE 06/21/2019 0916   HGBUR MODERATE (A) 06/21/2019 0916   BILIRUBINUR SMALL (A) 06/21/2019 0916   KETONESUR NEGATIVE 06/21/2019 0916   PROTEINUR 30 (A) 06/21/2019 0916   NITRITE NEGATIVE 06/21/2019 0916   LEUKOCYTESUR MODERATE (A) 06/21/2019 0916   Sepsis Labs:  @[email or web address] (procalcitonin:4,lacticacidven:4)  ) Recent Results (from the past 240 hour(s))  Urine culture     Status: Abnormal   Collection Time: 06/18/19 11:05 PM   Specimen: Urine, Random  Result Value Ref Range Status   Specimen Description URINE, RANDOM  Final   Special Requests   Final    NONE Performed at [facility], [place] [address] [place], [place] [address]    Culture >=100,000 COLONIES/mL KLEBSIELLA PNEUMONIAE (A)  Final   Report Status 06/21/2019 FINAL  Final   Organism ID, Bacteria KLEBSIELLA PNEUMONIAE (A)  Final      Susceptibility   Klebsiella pneumoniae - MIC*    AMPICILLIN RESISTANT Resistant     CEFAZOLIN <=4 SENSITIVE Sensitive     CEFTRIAXONE <=1 SENSITIVE Sensitive     CIPROFLOXACIN <=0.25 SENSITIVE Sensitive     GENTAMICIN <=1 SENSITIVE Sensitive     IMIPENEM <=0.25 SENSITIVE Sensitive     NITROFURANTOIN 32 SENSITIVE Sensitive  TRIMETH/SULFA <=20 SENSITIVE Sensitive     AMPICILLIN/SULBACTAM 4 SENSITIVE Sensitive     PIP/TAZO <=4 SENSITIVE Sensitive     Extended ESBL NEGATIVE Sensitive     * >=100,000 COLONIES/mL KLEBSIELLA PNEUMONIAE  SARS Coronavirus 2 by RT PCR (hospital order, performed in [facility] hospital lab) Nasopharyngeal Nasopharyngeal Swab     Status: None   Collection Time: 06/18/19 11:25 PM   Specimen: Nasopharyngeal Swab  Result Value Ref Range Status   SARS Coronavirus 2 NEGATIVE NEGATIVE Final    Comment: (NOTE) If result is NEGATIVE SARS-CoV-2 target nucleic acids are NOT DETECTED. The SARS-CoV-2 RNA is generally detectable in upper and lower  respiratory specimens during the acute phase of infection. The lowest  concentration of SARS-CoV-2 viral copies this assay can detect is 250  copies / mL. A negative result does not preclude SARS-CoV-2 infection  and should not be used as the sole basis for treatment or other  patient management decisions.  A negative result may occur with  improper specimen collection / handling,  submission of specimen other  than nasopharyngeal swab, presence of viral mutation(s) within the  areas targeted by this assay, and inadequate number of viral copies  (<250 copies / mL). A negative result must be combined with clinical  observations, patient history, and epidemiological information. If result is POSITIVE SARS-CoV-2 target nucleic acids are DETECTED. The SARS-CoV-2 RNA is generally detectable in upper and lower  respiratory specimens dur ing the acute phase of infection.  Positive  results are indicative of active infection with SARS-CoV-2.  Clinical  correlation with patient history and other diagnostic information is  necessary to determine patient infection status.  Positive results do  not rule out bacterial infection or co-infection with other viruses. If result is PRESUMPTIVE POSTIVE SARS-CoV-2 nucleic acids MAY BE PRESENT.   A presumptive positive result was obtained on the submitted specimen  and confirmed on repeat testing.  While 2019 novel coronavirus  (SARS-CoV-2) nucleic acids may be present in the submitted sample  additional confirmatory testing may be necessary for epidemiological  and / or clinical management purposes  to differentiate between  SARS-CoV-2 and other Sarbecovirus currently known to infect humans.  If clinically indicated additional testing with an alternate test  methodology [phone]) is advised. The SARS-CoV-2 RNA is generally  detectable in upper and lower respiratory sp ecimens during the acute  phase of infection. The expected result is Negative. Fact Sheet for Patients:  StrictlyIdeas.no Fact Sheet for Healthcare Providers: BankingDealers.co.za This test is not yet approved or cleared by the [place] FDA and has been authorized for detection and/or diagnosis of SARS-CoV-2 by FDA under an Emergency Use Authorization (EUA).  This EUA will remain in effect (meaning this test can be  used) for the duration of the COVID-19 declaration under Section 564(b)(1) of the Act, 21 U.S.C. section 360bbb-3(b)(1), unless the authorization is terminated or revoked sooner. Performed at [facility], [place] [address] [place], [place] [address]   Culture, blood (routine x 2)     Status: None   Collection Time: 06/19/19  1:17 AM   Specimen: BLOOD LEFT HAND  Result Value Ref Range Status   Specimen Description BLOOD LEFT HAND  Final   Special Requests   Final    BOTTLES DRAWN AEROBIC AND ANAEROBIC Blood Culture results may not be optimal due to an inadequate volume of blood received in culture bottles   Culture   Final    NO GROWTH 5 DAYS  Performed at [facility], [place] [address], [place], [place] [address]    Report Status 06/24/2019 FINAL  Final  Culture, blood (routine x 2)     Status: None   Collection Time: 06/19/19  1:30 AM   Specimen: BLOOD RIGHT HAND  Result Value Ref Range Status   Specimen Description BLOOD RIGHT HAND  Final   Special Requests   Final    BOTTLES DRAWN AEROBIC AND ANAEROBIC Blood Culture results may not be optimal due to an inadequate volume of blood received in culture bottles   Culture   Final    NO GROWTH 5 DAYS Performed at [facility], [place] [address], [place], [place] [address]    Report Status 06/24/2019 FINAL  Final  Culture, Urine     Status: None   Collection Time: 06/22/19  8:16 AM   Specimen: Urine, Catheterized  Result Value Ref Range Status   Specimen Description URINE, CATHETERIZED  Final   Special Requests NONE  Final   Culture   Final    NO GROWTH Performed at [facility], [address], [place], [place] [address]    Report Status 06/23/2019 FINAL  Final         Radiology Studies: No results found.      Scheduled Meds: . amoxicillin-clavulanate  1 tablet Oral Q12H  . aspirin EC  81 mg Oral Daily  . Chlorhexidine Gluconate Cloth  6 each Topical Daily  . feeding supplement (ENSURE ENLIVE)   237 mL Oral BID BM  . finasteride  5 mg Oral Daily  . insulin aspart  0-9 Units Subcutaneous Q4H  . isosorbide-hydrALAZINE  1 tablet Oral TID  . multivitamin with minerals  1 tablet Oral Daily  . polycarbophil  625 mg Oral Daily  . saccharomyces boulardii  250 mg Oral BID  . sevelamer carbonate  800 mg Oral TID WC  . sodium chloride flush  10-40 mL Intracatheter Q12H  . sodium chloride flush  3 mL Intravenous Once  . tamsulosin  0.4 mg Oral Daily  . torsemide  80 mg Oral Daily  . Warfarin - Pharmacist Dosing Inpatient   Does not apply q1800   Continuous Infusions: . DOBUTamine 1 mcg/kg/min (06/26/19 1528)  . heparin 1,700 Units/hr (06/26/19 0620)     LOS: 7 days    Time spent: 25 minutes    Myrene Buddy, MD   [facility] 06/26/2019, 6:46 PM     Please page through [place]:  [email or web address] Password [email or web address] If 7PM-7AM, please contact night-coverage

## 2019-06-27 DIAGNOSIS — N179 Acute kidney failure, unspecified: Secondary | ICD-10-CM | POA: Diagnosis not present

## 2019-06-27 DIAGNOSIS — I5043 Acute on chronic combined systolic (congestive) and diastolic (congestive) heart failure: Secondary | ICD-10-CM | POA: Diagnosis not present

## 2019-06-27 LAB — GLUCOSE, CAPILLARY
Glucose-Capillary: 105 mg/dL — ABNORMAL HIGH (ref 70–99)
Glucose-Capillary: 113 mg/dL — ABNORMAL HIGH (ref 70–99)
Glucose-Capillary: 119 mg/dL — ABNORMAL HIGH (ref 70–99)
Glucose-Capillary: 121 mg/dL — ABNORMAL HIGH (ref 70–99)
Glucose-Capillary: 122 mg/dL — ABNORMAL HIGH (ref 70–99)
Glucose-Capillary: 134 mg/dL — ABNORMAL HIGH (ref 70–99)

## 2019-06-27 LAB — CBC
HCT: 26 % — ABNORMAL LOW (ref 39.0–52.0)
Hemoglobin: 8.8 g/dL — ABNORMAL LOW (ref 13.0–17.0)
MCH: 29.2 pg (ref 26.0–34.0)
MCHC: 33.8 g/dL (ref 30.0–36.0)
MCV: 86.4 fL (ref 80.0–100.0)
Platelets: 254 10*3/uL (ref 150–400)
RBC: 3.01 MIL/uL — ABNORMAL LOW (ref 4.22–5.81)
RDW: 19.6 % — ABNORMAL HIGH (ref 11.5–15.5)
WBC: 10.3 10*3/uL (ref 4.0–10.5)
nRBC: 0.3 % — ABNORMAL HIGH (ref 0.0–0.2)

## 2019-06-27 LAB — COMPREHENSIVE METABOLIC PANEL
ALT: 20 U/L (ref 0–44)
AST: 25 U/L (ref 15–41)
Albumin: 1.7 g/dL — ABNORMAL LOW (ref 3.5–5.0)
Alkaline Phosphatase: 51 U/L (ref 38–126)
Anion gap: 10 (ref 5–15)
BUN: 91 mg/dL — ABNORMAL HIGH (ref 8–23)
CO2: 33 mmol/L — ABNORMAL HIGH (ref 22–32)
Calcium: 8.6 mg/dL — ABNORMAL LOW (ref 8.9–10.3)
Chloride: 96 mmol/L — ABNORMAL LOW (ref 98–111)
Creatinine, Ser: 2.78 mg/dL — ABNORMAL HIGH (ref 0.61–1.24)
GFR calc Af Amer: 25 mL/min — ABNORMAL LOW (ref >60)
GFR calc non Af Amer: 21 mL/min — ABNORMAL LOW (ref >60)
Glucose, Bld: 124 mg/dL — ABNORMAL HIGH (ref 70–99)
Potassium: 3.9 mmol/L (ref 3.5–5.1)
Sodium: 139 mmol/L (ref 135–145)
Total Bilirubin: 6 mg/dL — ABNORMAL HIGH (ref 0.3–1.2)
Total Protein: 6.2 g/dL — ABNORMAL LOW (ref 6.5–8.1)

## 2019-06-27 LAB — HEPARIN LEVEL (UNFRACTIONATED): Heparin Unfractionated: 0.56 IU/mL (ref 0.30–0.70)

## 2019-06-27 LAB — COOXEMETRY PANEL
Carboxyhemoglobin: 3.7 % — ABNORMAL HIGH (ref 0.5–1.5)
Methemoglobin: 1.3 % (ref 0.0–1.5)
O2 Saturation: 66.2 %
Total hemoglobin: 8.6 g/dL — ABNORMAL LOW (ref 12.0–16.0)

## 2019-06-27 LAB — PROTIME-INR
INR: 1.6 — ABNORMAL HIGH (ref 0.8–1.2)
Prothrombin Time: 19 seconds — ABNORMAL HIGH (ref 11.4–15.2)

## 2019-06-27 MED ORDER — TORSEMIDE 20 MG PO TABS
60.0000 mg | ORAL_TABLET | Freq: Every day | ORAL | Status: DC
  Filled 2019-06-27: qty 3

## 2019-06-27 MED ORDER — WARFARIN SODIUM 5 MG PO TABS
5.0000 mg | ORAL_TABLET | Freq: Once | ORAL | Status: AC
  Administered 2019-06-27: 5 mg via ORAL
  Filled 2019-06-27: qty 1

## 2019-06-27 NOTE — Progress Notes (Signed)
CARDIAC REHAB PHASE I   PRE:  Rate/Rhythm: 102 ST    BP: sitting 113/62    SaO2:   MODE:  Ambulation: 140 ft   POST:  Rate/Rhythm: 118 ST    BP: sitting 112/58     SaO2: 98 RA   Pt c/o gas pains and eager to get out of bed. Slowly moved to EOB and stood independently from raised surface. Walked with RW, standby assist, slow pace. Appears fatigued but not complaining. To recliner. Hasn't eaten breakfast yet. HR higher today but no meds yet. [id number]  [place], ACSM 06/27/2019 10:15 AM

## 2019-06-27 NOTE — Progress Notes (Signed)
ANTICOAGULATION CONSULT NOTE  Pharmacy Consult for Heparin > warfarin Indication: LV thrombus  No Known Allergies  Patient Measurements: Height: 5\' 11"  (180.3 cm) Weight: 176 lb 3.2 oz (79.9 kg) IBW/kg (Calculated) : 75.3  Heparin dosing weight: 90 kg  Vital Signs: Temp: 97.9 F (36.6 C) (10/28 0400) Temp Source: Oral (10/28 0400) BP: 99/66 (10/28 0400) Pulse Rate: 101 (10/27 2356)  Labs: Recent Labs    06/25/19 0441 06/25/19 0442 06/26/19 0430 06/27/19 0401 06/27/19 0402  HGB 10.5*  --  9.6*  --  8.8*  HCT 30.5*  --  28.9*  --  26.0*  PLT 202  --  223  --  254  LABPROT  --   --  16.2*  --  19.0*  INR  --   --  1.3*  --  1.6*  HEPARINUNFRC  --  0.51 0.34 0.56  --   CREATININE 4.19*  --  3.39*  --  2.78*    Estimated Creatinine Clearance: 24.5 mL/min (A) (by C-G formula based on SCr of 2.78 mg/dL (H)).  Assessment: 78 y.o. 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  Heparin level 0.56 at goal this AM, no overt bleeding or complications noted, h/h slight drop from yesterday will watch pltc stable.   Warfarin started 10/26- planned procedures are complete and hopefully renal function will continue to improve and no need for HD. INR 1.6   Goal of Therapy:  Heparin level 0.3-0.7 units/ml  INR 2-3 Monitor platelets by anticoagulation protocol: Yes   Plan:  Continue Heparin 1700 units/hr. Warfarin 5mg  x1 today  Daily heparin level, INR and CBC  Vertis Kelch, PharmD PGY2 Cardiology Pharmacy Resident Phone [phone] 06/27/2019       7:33 AM  Please check [email or web address] for unit-specific pharmacist phone numbers

## 2019-06-27 NOTE — Progress Notes (Signed)
PROGRESS NOTE    Christin Mccreedy  DPO:[id number] DOB: 1941-01-01 DOA: 06/18/2019 PCP: [name], MD      Brief Narrative:  Mr. Botelho is a 78 y.o. M with HTN, sCHF EF 25%, CKD III baseline Cr 2 and alcohol use who presented with 4-5 days progressive abdominal pain with vomiting.  In the ER, US abdomen showed gallstones, was admitted for possible cholecystitis.        Assessment & Plan:  Acute Cardiogenic Shock Decompensated Biventricular Failure LV thrombus Patient initially admitted for choledocholithiasis, but also thought to be fluid overloaded.  Started on Lasix, Cardiology consulted.  Subsequently, renal function worsened, diuretics were held, and he was started on fluids.  Echocardiogram obtained at that point showed EF <20% (down from baseline 25%) and signs of decompensated biventricular failure, and so dobutamine was started and Lasix restarted.  Since starting dobutamine, his creatinine has gotten steadily better and his edema has resolved.  Now back on oral diuretics.  Still on heparin bridge to warfarin. -Per HF service   Acute renal failure on CKD stage III Improving, Cr 2.78 today, baseline around 2 mg/dL.  Acute choledocholithiasis and cholangitis Had had sepsis from cholecystitis in 2019 with percutaneous cholecystostomy, no problems since.  Admitted this admission primarily due to cholecystitis, cholangitis.    Underwent ERCP on 10/20 by Dr. Ardis Hughs, biliary sphincterotomy, stone removal and noted purulence.  Also noted failure to opacify GB during ERCP.  Treated with Zosyn x4 days followed by Augmentin to complete 10 days.    Off antibiotics now 24 hours and symptoms have completely resolved. -Trend CBC daily -Monitor fever curve -If WBC, fever or recurrent RUQ pain, would have to reimage GB and consider cholecystitis  Diarrhea Resolved   Ileus Resolved  Diabetes Glucoses good -Continue SS insulin corrections  BPH / Urinary Retention   Chronic indwelling foley  -Continue Flomax, finasteride       DVT prophylaxis: On systemic anticoagulation Code Status: DNR Family Communication:       Consultants:   Cardiology  Gastroenterology  Nephrology  Palliative care   Procedures:   10/20 ERCP -- stones, purulent cholangitis  10/21 CT abdomen -- mild partial ileus  10/22 US renal  10/23 echo LVEF 10-15%.  Dilated LV.  RV moderately reduced systolic function and dilated.  IV septum flattened in diastole indicating volume overload.  L pleural effusion.  Apical thrombus.    10/23 PICC placement  Antimicrobials:   Zosyn 10/19 >> 10/22  Augmentin 10/23 >>  Culture data:   10/19 COVID >> negative   10/19 urine culture >> klebsiella pneumoniae >> S-ceftriaxone, bactrim, zosyn  10/20 blood culture x2 -- pending / NGTD >>  10/23 urine culture >> negative     Subjective: Feeling well.  Swelling is resolved, no new fever, right upper quadrant pain, jaundice, vomiting, nausea, decreased appetite.  No orthopnea or chest pain or cough.         Objective: Vitals:   06/27/19 1430 06/27/19 1500 06/27/19 1530 06/27/19 1538  BP: 109/61 (!) 96/54 120/65   Pulse:      Resp: 18 (!) 24 (!) 29 15  Temp:      TempSrc:      SpO2:      Weight:      Height:        Intake/Output Summary (Last 24 hours) at 06/27/2019 1626 Last data filed at 06/27/2019 1241 Gross per 24 hour  Intake 0 ml  Output 2751 ml  Net -2751 ml   Filed Weights   06/25/19 0426 06/26/19 0450 06/27/19 0500  Weight: 87.5 kg 82.1 kg 79.9 kg    Examination: General appearance: Adult male, sitting in bed, no acute distress. HEENT:   Skin:     Cardiac: RRR, no murmurs appreciated, no lower extremity edema Respiratory: Respirations even and unlabored, lungs clear with good air entry, no rales or wheezes. Abdomen: Abdomen soft without tenderness to palpation, guarding, or ascites. MSK: Thenar wasting, diffuse loss of subcutaneous  muscle mass and fat, mild. Neuro: Alert, extraocular movements intact, moves all extremities with normal strength and coordination, speech fluent. Psych: Attention normal, affect normal, judgment insight appeared normal.      Data Reviewed: I have personally reviewed following labs and imaging studies:  CBC: Recent Labs  Lab 06/22/19 0401 06/24/19 0627 06/25/19 0441 06/26/19 0430 06/27/19 0402  WBC 6.4 5.7 6.3 7.5 10.3  HGB 10.0* 9.7* 10.5* 9.6* 8.8*  HCT 28.3* 28.3* 30.5* 28.9* 26.0*  MCV 83.0 83.7 83.6 85.8 86.4  PLT 145* 180 202 223 676   Basic Metabolic Panel: Recent Labs  Lab 06/23/19 0530 06/24/19 0627 06/25/19 0441 06/26/19 0430 06/27/19 0402  NA 136 135 138 139 139  K 3.9 3.5 3.9 4.0 3.9  CL 102 100 99 98 96*  CO2 23 23 27 31  33*  GLUCOSE 155* 90 106* 129* 124*  BUN 120* 115* 109* 99* 91*  CREATININE 5.16* 4.85* 4.19* 3.39* 2.78*  CALCIUM 8.0* 8.0* 8.6* 8.5* 8.6*   GFR: Estimated Creatinine Clearance: 24.5 mL/min (A) (by C-G formula based on SCr of 2.78 mg/dL (H)). Liver Function Tests: Recent Labs  Lab 06/23/19 0530 06/24/19 0627 06/25/19 0441 06/26/19 0430 06/27/19 0402  AST 15 16 19 22 25   ALT 16 15 17 18 20   ALKPHOS 42 45 49 48 51  BILITOT 8.1* 6.7* 6.5* 5.6* 6.0*  PROT 6.4* 6.0* 6.7 6.4* 6.2*  ALBUMIN 1.7* 1.7* 1.8* 1.7* 1.7*   No results for input(s): LIPASE, AMYLASE in the last 168 hours. No results for input(s): AMMONIA in the last 168 hours. Coagulation Profile: Recent Labs  Lab 06/26/19 0430 06/27/19 0402  INR 1.3* 1.6*   Cardiac Enzymes: No results for input(s): CKTOTAL, CKMB, CKMBINDEX, TROPONINI in the last 168 hours. BNP (last 3 results) No results for input(s): PROBNP in the last 8760 hours. HbA1C: No results for input(s): HGBA1C in the last 72 hours. CBG: Recent Labs  Lab 06/26/19 2016 06/26/19 2354 06/27/19 0419 06/27/19 0810 06/27/19 1151  GLUCAP 133* 149* 121* 105* 134*   Lipid Profile: No results for  input(s): CHOL, HDL, LDLCALC, TRIG, CHOLHDL, LDLDIRECT in the last 72 hours. Thyroid Function Tests: No results for input(s): TSH, T4TOTAL, FREET4, T3FREE, THYROIDAB in the last 72 hours. Anemia Panel: No results for input(s): VITAMINB12, FOLATE, FERRITIN, TIBC, IRON, RETICCTPCT in the last 72 hours. Urine analysis:    Component Value Date/Time   COLORURINE AMBER (A) 06/21/2019 0916   APPEARANCEUR CLOUDY (A) 06/21/2019 0916   LABSPEC 1.020 06/21/2019 0916   PHURINE 5.0 06/21/2019 0916   GLUCOSEU NEGATIVE 06/21/2019 0916   HGBUR MODERATE (A) 06/21/2019 0916   BILIRUBINUR SMALL (A) 06/21/2019 0916   KETONESUR NEGATIVE 06/21/2019 0916   PROTEINUR 30 (A) 06/21/2019 0916   NITRITE NEGATIVE 06/21/2019 0916   LEUKOCYTESUR MODERATE (A) 06/21/2019 0916   Sepsis Labs: @LABRCNTIP (procalcitonin:4,lacticacidven:4)  ) Recent Results (from the past 240 hour(s))  Urine culture     Status: Abnormal   Collection Time: 06/18/19 11:05  PM   Specimen: Urine, Random  Result Value Ref Range Status   Specimen Description URINE, RANDOM  Final   Special Requests   Final    NONE Performed at [facility], [address] [place], [place] [address]    Culture >=100,000 COLONIES/mL KLEBSIELLA PNEUMONIAE (A)  Final   Report Status 06/21/2019 FINAL  Final   Organism ID, Bacteria KLEBSIELLA PNEUMONIAE (A)  Final      Susceptibility   Klebsiella pneumoniae - MIC*    AMPICILLIN RESISTANT Resistant     CEFAZOLIN <=4 SENSITIVE Sensitive     CEFTRIAXONE <=1 SENSITIVE Sensitive     CIPROFLOXACIN <=0.25 SENSITIVE Sensitive     GENTAMICIN <=1 SENSITIVE Sensitive     IMIPENEM <=0.25 SENSITIVE Sensitive     NITROFURANTOIN 32 SENSITIVE Sensitive     TRIMETH/SULFA <=20 SENSITIVE Sensitive     AMPICILLIN/SULBACTAM 4 SENSITIVE Sensitive     PIP/TAZO <=4 SENSITIVE Sensitive     Extended ESBL NEGATIVE Sensitive     * >=100,000 COLONIES/mL KLEBSIELLA PNEUMONIAE  SARS Coronavirus 2 by RT PCR (hospital order,  performed in [place] hospital lab) Nasopharyngeal Nasopharyngeal Swab     Status: None   Collection Time: 06/18/19 11:25 PM   Specimen: Nasopharyngeal Swab  Result Value Ref Range Status   SARS Coronavirus 2 NEGATIVE NEGATIVE Final    Comment: (NOTE) If result is NEGATIVE SARS-CoV-2 target nucleic acids are NOT DETECTED. The SARS-CoV-2 RNA is generally detectable in upper and lower  respiratory specimens during the acute phase of infection. The lowest  concentration of SARS-CoV-2 viral copies this assay can detect is 250  copies / mL. A negative result does not preclude SARS-CoV-2 infection  and should not be used as the sole basis for treatment or other  patient management decisions.  A negative result may occur with  improper specimen collection / handling, submission of specimen other  than nasopharyngeal swab, presence of viral mutation(s) within the  areas targeted by this assay, and inadequate number of viral copies  (<250 copies / mL). A negative result must be combined with clinical  observations, patient history, and epidemiological information. If result is POSITIVE SARS-CoV-2 target nucleic acids are DETECTED. The SARS-CoV-2 RNA is generally detectable in upper and lower  respiratory specimens dur ing the acute phase of infection.  Positive  results are indicative of active infection with SARS-CoV-2.  Clinical  correlation with patient history and other diagnostic information is  necessary to determine patient infection status.  Positive results do  not rule out bacterial infection or co-infection with other viruses. If result is PRESUMPTIVE POSTIVE SARS-CoV-2 nucleic acids MAY BE PRESENT.   A presumptive positive result was obtained on the submitted specimen  and confirmed on repeat testing.  While 2019 novel coronavirus  (SARS-CoV-2) nucleic acids may be present in the submitted sample  additional confirmatory testing may be necessary for epidemiological  and / or  clinical management purposes  to differentiate between  SARS-CoV-2 and other Sarbecovirus currently known to infect humans.  If clinically indicated additional testing with an alternate test  methodology [phone]) is advised. The SARS-CoV-2 RNA is generally  detectable in upper and lower respiratory sp ecimens during the acute  phase of infection. The expected result is Negative. Fact Sheet for Patients:  StrictlyIdeas.no Fact Sheet for Healthcare Providers: BankingDealers.co.za This test is not yet approved or cleared by the [place] FDA and has been authorized for detection and/or diagnosis of SARS-CoV-2 by FDA under an  Emergency Use Authorization (EUA).  This EUA will remain in effect (meaning this test can be used) for the duration of the COVID-19 declaration under Section 564(b)(1) of the Act, 21 U.S.C. section 360bbb-3(b)(1), unless the authorization is terminated or revoked sooner. Performed at [facility], [place] [address] [place], [place] [address]   Culture, blood (routine x 2)     Status: None   Collection Time: 06/19/19  1:17 AM   Specimen: BLOOD LEFT HAND  Result Value Ref Range Status   Specimen Description BLOOD LEFT HAND  Final   Special Requests   Final    BOTTLES DRAWN AEROBIC AND ANAEROBIC Blood Culture results may not be optimal due to an inadequate volume of blood received in culture bottles   Culture   Final    NO GROWTH 5 DAYS Performed at [facility], [place] [address], [place], [place] [address]    Report Status 06/24/2019 FINAL  Final  Culture, blood (routine x 2)     Status: None   Collection Time: 06/19/19  1:30 AM   Specimen: BLOOD RIGHT HAND  Result Value Ref Range Status   Specimen Description BLOOD RIGHT HAND  Final   Special Requests   Final    BOTTLES DRAWN AEROBIC AND ANAEROBIC Blood Culture results may not be optimal due to an inadequate volume of blood received in culture bottles    Culture   Final    NO GROWTH 5 DAYS Performed at [facility], [place] [address] [place], [place] [address]    Report Status 06/24/2019 FINAL  Final  Culture, Urine     Status: None   Collection Time: 06/22/19  8:16 AM   Specimen: Urine, Catheterized  Result Value Ref Range Status   Specimen Description URINE, CATHETERIZED  Final   Special Requests NONE  Final   Culture   Final    NO GROWTH Performed at [facility], [address] [place], [place] [address]    Report Status 06/23/2019 FINAL  Final         Radiology Studies: No results found.      Scheduled Meds: . aspirin EC  81 mg Oral Daily  . Chlorhexidine Gluconate Cloth  6 each Topical Daily  . feeding supplement (ENSURE ENLIVE)  237 mL Oral BID BM  . finasteride  5 mg Oral Daily  . insulin aspart  0-9 Units Subcutaneous Q4H  . multivitamin with minerals  1 tablet Oral Daily  . polycarbophil  625 mg Oral Daily  . saccharomyces boulardii  250 mg Oral BID  . sevelamer carbonate  800 mg Oral TID WC  . sodium chloride flush  10-40 mL Intracatheter Q12H  . sodium chloride flush  3 mL Intravenous Once  . tamsulosin  0.4 mg Oral Daily  . [START ON 06/28/2019] torsemide  60 mg Oral Daily  . warfarin  5 mg Oral ONCE-1800  . Warfarin - Pharmacist Dosing Inpatient   Does not apply q1800   Continuous Infusions: . heparin 1,700 Units/hr (06/27/19 1241)     LOS: 8 days    Time spent: 25 minutes    Myrene Buddy, MD   [facility] 06/27/2019, 4:26 PM     Please page through [place]:  [email or web address] Password [email or web address] If 7PM-7AM, please contact night-coverage

## 2019-06-27 NOTE — TOC Benefit Eligibility Note (Signed)
Transition of Care [facility]) Benefit Eligibility Note    Patient Details  Name: [name] MRN: [id number] Date of Birth: 1941-05-23   Medication/Dose: BIDIL   20-37.5 MG BID  Covered?: No     Prescription Coverage Preferred Pharmacy: [facility] [place] with Person/Company/Phone Number:: SUSY  @   [employer]  XQ # [phone] OPT-2     Prior Approval: COD([phone] OPT-2)          Memory Argue Phone Number: 06/27/2019, 4:33 PM

## 2019-06-27 NOTE — Progress Notes (Addendum)
Advanced Heart Failure Rounding Note   Subjective:    Yesterday dobutamine was weaned to 1 mcg. CO-OX remains stable at 66%.  Creatinine continues to trend down.   Complaining of being tired. Says he didn't sleep well last night. Denies shortness of breath.   Objective:   Weight Range:  Vital Signs:   Temp:  [97.2 F (36.2 C)-98.7 F (37.1 C)] 98.7 F (37.1 C) (10/28 1212) Pulse Rate:  [61-101] 100 (10/28 1212) Resp:  [19-25] 19 (10/28 1212) BP: (90-124)/(48-72) 90/48 (10/28 1212) SpO2:  [91 %-99 %] 91 % (10/28 1212) Weight:  [79.9 kg] 79.9 kg (10/28 0500) Last BM Date: 06/26/19  Weight change: Filed Weights   06/25/19 0426 06/26/19 0450 06/27/19 0500  Weight: 87.5 kg 82.1 kg 79.9 kg    Intake/Output:   Intake/Output Summary (Last 24 hours) at 06/27/2019 1243 Last data filed at 06/27/2019 0618 Gross per 24 hour  Intake -  Output 3701 ml  Net -3701 ml  CVP 2-3 personally checked   General:   No resp difficulty HEENT: normal Neck: supple. no JVD. Carotids 2+ bilat; no bruits. No lymphadenopathy or thryomegaly appreciated. Cor: PMI nondisplaced. Regular rate & rhythm. No rubs, gallops or murmurs. Lungs: clear Abdomen: soft, nontender, nondistended. No hepatosplenomegaly. No bruits or masses. Good bowel sounds. Extremities: no cyanosis, clubbing, rash, edema Neuro: alert & orientedx3, cranial nerves grossly intact. moves all 4 extremities w/o difficulty. Affect pleasant GU: foley   Telemetry: NSR 80-90s with PVCs. Personally reviewed.    Labs: Basic Metabolic Panel: Recent Labs  Lab 06/23/19 0530 06/24/19 0627 06/25/19 0441 06/26/19 0430 06/27/19 0402  NA 136 135 138 139 139  K 3.9 3.5 3.9 4.0 3.9  CL 102 100 99 98 96*  CO2 23 23 27 31  33*  GLUCOSE 155* 90 106* 129* 124*  BUN 120* 115* 109* 99* 91*  CREATININE 5.16* 4.85* 4.19* 3.39* 2.78*  CALCIUM 8.0* 8.0* 8.6* 8.5* 8.6*    Liver Function Tests: Recent Labs  Lab 06/23/19 0530 06/24/19  0627 06/25/19 0441 06/26/19 0430 06/27/19 0402  AST 15 16 19 22 25   ALT 16 15 17 18 20   ALKPHOS 42 45 49 48 51  BILITOT 8.1* 6.7* 6.5* 5.6* 6.0*  PROT 6.4* 6.0* 6.7 6.4* 6.2*  ALBUMIN 1.7* 1.7* 1.8* 1.7* 1.7*   No results for input(s): LIPASE, AMYLASE in the last 168 hours. No results for input(s): AMMONIA in the last 168 hours.  CBC: Recent Labs  Lab 06/22/19 0401 06/24/19 0627 06/25/19 0441 06/26/19 0430 06/27/19 0402  WBC 6.4 5.7 6.3 7.5 10.3  HGB 10.0* 9.7* 10.5* 9.6* 8.8*  HCT 28.3* 28.3* 30.5* 28.9* 26.0*  MCV 83.0 83.7 83.6 85.8 86.4  PLT 145* 180 202 223 254    Cardiac Enzymes: No results for input(s): CKTOTAL, CKMB, CKMBINDEX, TROPONINI in the last 168 hours.  BNP: BNP (last 3 results) Recent Labs    08/16/18 0940 02/04/19 0906 06/18/19 2109  BNP 1,774.3* >4,500.0* 2,536.0*    ProBNP (last 3 results) No results for input(s): PROBNP in the last 8760 hours.    Other results:  Imaging: No results found.   Medications:     Scheduled Medications: . aspirin EC  81 mg Oral Daily  . Chlorhexidine Gluconate Cloth  6 each Topical Daily  . feeding supplement (ENSURE ENLIVE)  237 mL Oral BID BM  . finasteride  5 mg Oral Daily  . insulin aspart  0-9 Units Subcutaneous Q4H  . isosorbide-hydrALAZINE  1 tablet Oral TID  . multivitamin with minerals  1 tablet Oral Daily  . polycarbophil  625 mg Oral Daily  . saccharomyces boulardii  250 mg Oral BID  . sevelamer carbonate  800 mg Oral TID WC  . sodium chloride flush  10-40 mL Intracatheter Q12H  . sodium chloride flush  3 mL Intravenous Once  . tamsulosin  0.4 mg Oral Daily  . torsemide  80 mg Oral Daily  . warfarin  5 mg Oral ONCE-1800  . Warfarin - Pharmacist Dosing Inpatient   Does not apply q1800    Infusions: . DOBUTamine 1 mcg/kg/min (06/26/19 1528)  . heparin 1,700 Units/hr (06/26/19 2238)    PRN Medications: alum & mag hydroxide-simeth, ondansetron (ZOFRAN) IV, sodium chloride flush    Assessment/Plan:    1. Acute/chronic systolic CHF: low output HF with cardiorenal syndrome with progressive worsening of renal function. Diureses and renal function now improving w/ inotropic therapy.  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 Previously declined cath. He does, of note, have anterior Qs on ECG.  Not a LHC candidate due to baseline renal function. Echo this admission showed EF < 20%, severe LV dilation, small LV apical thrombus, severely dilated and dysfunctional RV with flattened septum. Dobutamine started on 10/23.  Tolerating dobutamine wean. CO-OX stable. Stop dobutamine today. - CVP 2-3. Overall diuresed 32 pounds. Cut back torsemide to 60 mg daily.   - Hold Bidil with soft BP.   - No ARB/ARNI or digoxin due to renal function.  - Continue TED hoses.  2. AKI: On CKD stage 3.  Initial creatinine 2.8, peaked 5.16. Trending down 4.85>>4.19>>3.39> 2.78 -  Suspect cardiorenal syndrome and now likely with ATN.  Baseline creatinine was 1.5 in 1/20 and 2.5 in 6/20 -  Dialysis would be very difficult for him with severe biventricular failure.  - Renal has seen. Patient initially refused short-term HD but now seems to be willing to consider. He is not candidate for long-term HD and given severe RV function likely not a candidate for VAD so long-term options limited to palliative inotropes or hospice so not sure if offering even short-term HD is reasonable. He lives alone at home and not sure he will be able to return there 3. LV thrombus: warfarin started 10/27. Bridge w/ IV heparin until INR therapeutic. INR 1.6 today 4. Ascending cholangitis: S/p ERCP and stone removal.  Now afebrile with normal WBCs.  - Continue Augmentin per primary service.  5. Hyperbilirubinemia: Likely due to combination of CBD obstruction (now relieved) and RV failure with hepatic congestion. total bili trending down, 10.6>>8.1>>6.7>>6.1>>5.6 6. Hypokalemia: resolved K  stable 7. Anemia Hgb trending down. No obvious source.  8.  GOC - He had meeting with Palliative Care and Dr. Loleta Books on 10/24. He is now DNR but would like aggressive care up to and including VAD support if he would qualify but CKD and RV failure likely prohibit VAD and thus long-term options limited to hospice vs palliative home inotropes.   Length of Stay: [place] NP-C   06/27/2019, 12:43 PM  Advanced Heart Failure Team Pager [phone] (M-F; 7a - 4p)  Please contact [facility] for night-coverage after hours (4p -7a ) and weekends on [email or web address]  Patient seen with NP, agree with the above note.   Creatinine continues to trend down.  Co-ox 66% off dobutamine, CVP 2-3. INR 1.6 on heparin gtt + warfarin.   General: NAD Neck: No  JVD, no thyromegaly or thyroid nodule.  Lungs: Clear to auscultation bilaterally with normal respiratory effort. CV: Nondisplaced PMI.  Heart regular S1/S2, no S3/S4, no murmur.  No peripheral edema.   Abdomen: Soft, nontender, no hepatosplenomegaly, no distention.  Skin: Intact without lesions or rashes.  Neurologic: Alert and oriented x 3.  Psych: Normal affect. Extremities: No clubbing or cyanosis.  HEENT: Normal.   He looks euvolemic today, will maintain him on torsemide 60 mg daily.  Doing fine off dobutamine.  Will hold Bidil today with soft BP, hopefully start on low dose 1/2 tab tid tomorrow.  If creatinine remains stable to lower, hopefully home soon.   Stop heparin gtt when INR > 2.   Loralie Champagne 06/27/2019 3:27 PM

## 2019-06-27 NOTE — Progress Notes (Signed)
Physical Therapy Treatment Patient Details Name: [name] MRN: [id number] DOB: August 06, 1941 Today's Date: 06/27/2019    History of Present Illness [name] is a 78 y.o. M with HTN, sCHF EF 25%, CKD III baseline Cr 2, DM, PVD and alcohol use who presented with 4-5 days progressive abdominal pain with vomiting.  Found to have choledocholithiasis and underwent ERCP.  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    PT Comments    Patient seen for mobility progression. Pt requires min/mod A for sit to stand transfer from low surface (recliner) and min guard for gait distance of 100 ft with RW. Pt presents with decreased activity tolerance and generalized weakness and will continue to benefit from further skilled PT services to maximize independence and safety with mobility. Pt will need 24 hour supervision/assistance upon d/c.    Follow Up Recommendations  Home health PT;Supervision/Assistance - 24 hour     Equipment Recommendations  None recommended by PT    Recommendations for Other Services       Precautions / Restrictions Precautions Precautions: Fall Restrictions Weight Bearing Restrictions: No    Mobility  Bed Mobility Overal bed mobility: Needs Assistance Bed Mobility: Sit to Supine       Sit to supine: Min guard   General bed mobility comments: use of rails to position in bed; min guard for safety and to manage lines  Transfers Overall transfer level: Needs assistance Equipment used: Rolling walker (2 wheeled) Transfers: Sit to/from Stand Sit to Stand: Min assist;Mod assist         General transfer comment: pt able to stand on second trial; increased assistance required to stand from low surface; cues for positioning prior to stand  Ambulation/Gait Ambulation/Gait assistance: Min guard Gait Distance (Feet): 100 Feet Assistive device: Rolling walker (2 wheeled) Gait Pattern/deviations: Step-through pattern;Decreased stride length;Trunk  flexed Gait velocity: decreased   General Gait Details: cues for upright posture and maintaining safe proximity to RW; pt with decreased cadence and bilat step length/height   Stairs             Wheelchair Mobility    Modified Rankin (Stroke Patients Only)       Balance Overall balance assessment: Needs assistance   Sitting balance-Leahy Scale: Good     Standing balance support: Bilateral upper extremity supported;Single extremity supported Standing balance-Leahy Scale: Poor                              Cognition Arousal/Alertness: Awake/alert Behavior During Therapy: WFL for tasks assessed/performed Overall Cognitive Status: Within Functional Limits for tasks assessed                                        Exercises      General Comments General comments (skin integrity, edema, etc.): VSS on RA; pt with c/o dizziness while in standing      Pertinent Vitals/Pain Pain Assessment: Faces Faces Pain Scale: Hurts little more Pain Location: legs with neuropathy and abdominal pain  Pain Descriptors / Indicators: Grimacing;Guarding;Sore Pain Intervention(s): Limited activity within patient's tolerance;Monitored during session;Repositioned    Home Living                      Prior Function            PT Goals (current goals  can now be found in the care plan section)      Frequency    Min 3X/week      PT Plan      Co-evaluation              AM-PAC PT "6 Clicks" Mobility   Outcome Measure  Help needed turning from your back to your side while in a flat bed without using bedrails?: A Little Help needed moving from lying on your back to sitting on the side of a flat bed without using bedrails?: A Little Help needed moving to and from a bed to a chair (including a wheelchair)?: A Little Help needed standing up from a chair using your arms (e.g., wheelchair or bedside chair)?: A Little Help needed to walk in  hospital room?: A Little Help needed climbing 3-5 steps with a railing? : A Little 6 Click Score: 18    End of Session Equipment Utilized During Treatment: Gait belt Activity Tolerance: Patient limited by fatigue Patient left: in bed;with call bell/phone within reach Nurse Communication: Mobility status PT Visit Diagnosis: Other abnormalities of gait and mobility (R26.89);Muscle weakness (generalized) (M62.81)     Time: 9969-2493 PT Time Calculation (min) (ACUTE ONLY): 25 min  Charges:  $Gait Training: 23-37 mins                     Earney Navy, PTA Acute Rehabilitation Services Pager: [phone] Office: [phone]     Darliss Cheney 06/27/2019, 4:25 PM

## 2019-06-28 ENCOUNTER — Inpatient Hospital Stay (HOSPITAL_COMMUNITY): Payer: Medicare Other

## 2019-06-28 DIAGNOSIS — R140 Abdominal distension (gaseous): Secondary | ICD-10-CM | POA: Diagnosis not present

## 2019-06-28 DIAGNOSIS — N179 Acute kidney failure, unspecified: Secondary | ICD-10-CM | POA: Diagnosis not present

## 2019-06-28 DIAGNOSIS — Z9889 Other specified postprocedural states: Secondary | ICD-10-CM

## 2019-06-28 DIAGNOSIS — K8050 Calculus of bile duct without cholangitis or cholecystitis without obstruction: Secondary | ICD-10-CM | POA: Diagnosis not present

## 2019-06-28 DIAGNOSIS — I5043 Acute on chronic combined systolic (congestive) and diastolic (congestive) heart failure: Secondary | ICD-10-CM | POA: Diagnosis not present

## 2019-06-28 LAB — MAGNESIUM: Magnesium: 1.9 mg/dL (ref 1.7–2.4)

## 2019-06-28 LAB — COMPREHENSIVE METABOLIC PANEL
ALT: 22 U/L (ref 0–44)
AST: 29 U/L (ref 15–41)
Albumin: 2 g/dL — ABNORMAL LOW (ref 3.5–5.0)
Alkaline Phosphatase: 63 U/L (ref 38–126)
Anion gap: 11 (ref 5–15)
BUN: 88 mg/dL — ABNORMAL HIGH (ref 8–23)
CO2: 32 mmol/L (ref 22–32)
Calcium: 8.8 mg/dL — ABNORMAL LOW (ref 8.9–10.3)
Chloride: 95 mmol/L — ABNORMAL LOW (ref 98–111)
Creatinine, Ser: 2.57 mg/dL — ABNORMAL HIGH (ref 0.61–1.24)
GFR calc Af Amer: 27 mL/min — ABNORMAL LOW (ref >60)
GFR calc non Af Amer: 23 mL/min — ABNORMAL LOW (ref >60)
Glucose, Bld: 108 mg/dL — ABNORMAL HIGH (ref 70–99)
Potassium: 4.2 mmol/L (ref 3.5–5.1)
Sodium: 138 mmol/L (ref 135–145)
Total Bilirubin: 6.7 mg/dL — ABNORMAL HIGH (ref 0.3–1.2)
Total Protein: 7 g/dL (ref 6.5–8.1)

## 2019-06-28 LAB — CBC
HCT: 28.1 % — ABNORMAL LOW (ref 39.0–52.0)
Hemoglobin: 9 g/dL — ABNORMAL LOW (ref 13.0–17.0)
MCH: 28.6 pg (ref 26.0–34.0)
MCHC: 32 g/dL (ref 30.0–36.0)
MCV: 89.2 fL (ref 80.0–100.0)
Platelets: 326 10*3/uL (ref 150–400)
RBC: 3.15 MIL/uL — ABNORMAL LOW (ref 4.22–5.81)
RDW: 20 % — ABNORMAL HIGH (ref 11.5–15.5)
WBC: 13.8 10*3/uL — ABNORMAL HIGH (ref 4.0–10.5)
nRBC: 0 % (ref 0.0–0.2)

## 2019-06-28 LAB — COOXEMETRY PANEL
Carboxyhemoglobin: 4.6 % — ABNORMAL HIGH (ref 0.5–1.5)
Carboxyhemoglobin: 4.5 % — ABNORMAL HIGH (ref 0.5–1.5)
Methemoglobin: 1 % (ref 0.0–1.5)
Methemoglobin: 1 % (ref 0.0–1.5)
O2 Saturation: 96.2 %
O2 Saturation: 87.1 %
Total hemoglobin: 8.8 g/dL — ABNORMAL LOW (ref 12.0–16.0)
Total hemoglobin: 8.6 g/dL — ABNORMAL LOW (ref 12.0–16.0)

## 2019-06-28 LAB — PROTIME-INR
INR: 1.3 — ABNORMAL HIGH (ref 0.8–1.2)
Prothrombin Time: 15.9 seconds — ABNORMAL HIGH (ref 11.4–15.2)

## 2019-06-28 LAB — GLUCOSE, CAPILLARY
Glucose-Capillary: 106 mg/dL — ABNORMAL HIGH (ref 70–99)
Glucose-Capillary: 113 mg/dL — ABNORMAL HIGH (ref 70–99)
Glucose-Capillary: 122 mg/dL — ABNORMAL HIGH (ref 70–99)
Glucose-Capillary: 138 mg/dL — ABNORMAL HIGH (ref 70–99)
Glucose-Capillary: 96 mg/dL (ref 70–99)
Glucose-Capillary: 97 mg/dL (ref 70–99)

## 2019-06-28 LAB — HEPARIN LEVEL (UNFRACTIONATED): Heparin Unfractionated: 0.64 IU/mL (ref 0.30–0.70)

## 2019-06-28 LAB — BILIRUBIN, DIRECT: Bilirubin, Direct: 3 mg/dL — ABNORMAL HIGH (ref 0.0–0.2)

## 2019-06-28 MED ORDER — SIMETHICONE 80 MG PO CHEW
80.0000 mg | CHEWABLE_TABLET | Freq: Four times a day (QID) | ORAL | Status: DC | PRN
  Administered 2019-06-28 – 2019-06-29 (×2): 80 mg via ORAL
  Filled 2019-06-28 (×2): qty 1

## 2019-06-28 MED ORDER — TORSEMIDE 20 MG PO TABS: 60.0000 mg | ORAL_TABLET | Freq: Every day | ORAL | Status: DC

## 2019-06-28 MED ORDER — SODIUM CHLORIDE 0.9 % IV SOLN
1.0000 g | INTRAVENOUS | Status: DC
  Administered 2019-06-28 – 2019-06-29 (×2): 1 g via INTRAVENOUS
  Filled 2019-06-28: qty 1
  Filled 2019-06-28: qty 10
  Filled 2019-06-28: qty 1

## 2019-06-28 NOTE — Progress Notes (Addendum)
Advanced Heart Failure Rounding Note   Subjective:    Dobutamine discontinued yesterday. Has biventricular HF. Total bili trending back up, 5.6>>6.0>>6.7.  Repeating Co-ox.  Creatinine continues to trend down, 2.57 today  Also treated for acending cholangitis this admit. S/p ERCP and stone removal. Plan, per notes was to treat for abx x 10 days but appears he has only received 8 days of abx. WBC ct elevated, up from 10.3>>13.8. afebrile. Sinus tach on tele at rest, low 100s.   13 beat run of NSVT on tele. K 4.2. Mg pending.   Only complaint is gas pains. No cardiac symptoms. Denies subjective fever or chills.   Objective:   Weight Range:  Vital Signs:   Temp:  [97.5 F (36.4 C)-98.7 F (37.1 C)] 97.6 F (36.4 C) (10/29 0351) Pulse Rate:  [89-103] 97 (10/29 0351) Resp:  [15-29] 24 (10/29 0351) BP: (90-125)/(48-73) 125/68 (10/29 0351) SpO2:  [91 %-100 %] 97 % (10/29 0351) Weight:  [79.5 kg] 79.5 kg (10/29 0351) Last BM Date: 06/26/19  Weight change: Filed Weights   06/26/19 0450 06/27/19 0500 06/28/19 0351  Weight: 82.1 kg 79.9 kg 79.5 kg    Intake/Output:   Intake/Output Summary (Last 24 hours) at 06/28/2019 0808 Last data filed at 06/28/2019 0700 Gross per 24 hour  Intake 908.34 ml  Output 2975 ml  Net -2066.66 ml   PHYSICAL EXAM: General:  Elderly AAM. No respiratory difficulty HEENT: normal Neck: supple. no JVD. Carotids 2+ bilat; no bruits. No lymphadenopathy or thyromegaly appreciated. Cor: PMI nondisplaced. Regular rate & rhythm. No rubs, gallops or murmurs. Lungs: clear Abdomen: soft, nontender, nondistended. No hepatosplenomegaly. No bruits or masses. Good bowel sounds. Extremities: no cyanosis, clubbing, rash, no edema, bilateral ted hoses  Neuro: alert & oriented x 3, cranial nerves grossly intact. moves all 4 extremities w/o difficulty. Affect pleasant.   Telemetry: Sinus tach low 100ss, 13 beat run of NSVT Personally reviewed.    Labs: Basic  Metabolic Panel: Recent Labs  Lab 06/24/19 0627 06/25/19 0441 06/26/19 0430 06/27/19 0402 06/28/19 0410  NA 135 138 139 139 138  K 3.5 3.9 4.0 3.9 4.2  CL 100 99 98 96* 95*  CO2 23 27 31  33* 32  GLUCOSE 90 106* 129* 124* 108*  BUN 115* 109* 99* 91* 88*  CREATININE 4.85* 4.19* 3.39* 2.78* 2.57*  CALCIUM 8.0* 8.6* 8.5* 8.6* 8.8*    Liver Function Tests: Recent Labs  Lab 06/24/19 0627 06/25/19 0441 06/26/19 0430 06/27/19 0402 06/28/19 0410  AST 16 19 22 25 29   ALT 15 17 18 20 22   ALKPHOS 45 49 48 51 63  BILITOT 6.7* 6.5* 5.6* 6.0* 6.7*  PROT 6.0* 6.7 6.4* 6.2* 7.0  ALBUMIN 1.7* 1.8* 1.7* 1.7* 2.0*   No results for input(s): LIPASE, AMYLASE in the last 168 hours. No results for input(s): AMMONIA in the last 168 hours.  CBC: Recent Labs  Lab 06/24/19 0627 06/25/19 0441 06/26/19 0430 06/27/19 0402 06/28/19 0410  WBC 5.7 6.3 7.5 10.3 13.8*  HGB 9.7* 10.5* 9.6* 8.8* 9.0*  HCT 28.3* 30.5* 28.9* 26.0* 28.1*  MCV 83.7 83.6 85.8 86.4 89.2  PLT 180 202 223 254 326    Cardiac Enzymes: No results for input(s): CKTOTAL, CKMB, CKMBINDEX, TROPONINI in the last 168 hours.  BNP: BNP (last 3 results) Recent Labs    08/16/18 0940 02/04/19 0906 06/18/19 2109  BNP 1,774.3* >4,500.0* 2,536.0*    ProBNP (last 3 results) No results for input(s): PROBNP in  the last 8760 hours.    Other results:  Imaging: No results found.   Medications:     Scheduled Medications: . aspirin EC  81 mg Oral Daily  . Chlorhexidine Gluconate Cloth  6 each Topical Daily  . feeding supplement (ENSURE ENLIVE)  237 mL Oral BID BM  . finasteride  5 mg Oral Daily  . insulin aspart  0-9 Units Subcutaneous Q4H  . multivitamin with minerals  1 tablet Oral Daily  . polycarbophil  625 mg Oral Daily  . saccharomyces boulardii  250 mg Oral BID  . sevelamer carbonate  800 mg Oral TID WC  . sodium chloride flush  10-40 mL Intracatheter Q12H  . sodium chloride flush  3 mL Intravenous Once  .  tamsulosin  0.4 mg Oral Daily  . torsemide  60 mg Oral Daily  . Warfarin - Pharmacist Dosing Inpatient   Does not apply q1800    Infusions: . heparin 1,700 Units/hr (06/28/19 0700)    PRN Medications: alum & mag hydroxide-simeth, ondansetron (ZOFRAN) IV, sodium chloride flush   Assessment/Plan:    1. 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 Diureses and renal function improved w/ inotropic therapy.  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 Previously declined cath. He does, of note, have anterior Qs on ECG.  Not a LHC candidate due to baseline renal function. Echo this admission showed EF < 20%, severe LV dilation, small LV apical thrombus, severely dilated and dysfunctional RV with flattened septum. Dobutamine started on 10/23 and discontinued 10/28. - Total bili trending back up, 5.6>>6.0>>6.7  - Repeating Co-ox this am to reassess now that he is off inotrope   - CVP 2-4. Overall diuresed 33 pounds. Now on PO torsemide to 60 mg daily. Continues to diurese well w/ an additional 2.9 L out yesterday. SCr continues to improve.  - No ARB/ARNI or digoxin due to renal function.  - Hold off on resuming Bidil, not eating much and SBP around 100.  - Continue TED hoses.  2. AKI: On CKD stage 3.  Initial creatinine 2.8, peaked 5.16. Trending down 4.85>>4.19>>3.39> 2.78>>2.57. BUN trending down 99>>91>>88 -  Suspect cardiorenal syndrome and now likely with ATN.  Baseline creatinine was 1.5 in 1/20 and 2.5 in 6/20 -  Dialysis would be very difficult for him with severe biventricular failure.  - Renal has seen. Patient initially refused short-term HD but now seems to be willing to consider. He is not candidate for long-term HD and given severe RV function likely not a candidate for VAD so long-term options limited to palliative inotropes or hospice so not sure if offering even  short-term HD is reasonable. He lives alone at home and not sure he will be able to return there 3. LV thrombus: warfarin started 10/27. Bridge w/ IV heparin until INR therapeutic. INR 1.3 today. PharmD to assist w/ dosing 4. Ascending cholangitis: S/p ERCP and stone removal.  - Completed course of abx, Zosyn>> Augmentin. WBC ct trending back up. ? Another round of abx. Will defer to primary team 5. Hyperbilirubinemia: Likely due to combination of CBD obstruction (now relieved) and RV failure with hepatic congestion. Total bili now trending back up, 10.6>>8.1>>6.7>>6.1>>5.6>>6.0>>6.7 6. Hypokalemia: resolved. K stable at 4.2 today  7. Anemia Hgb trending back up, 8.8>>9.0. No obvious source.  8. NSVT: in the setting of severe LV dysfunction. 13 beat run on tele. K 4.2. Check Mg  level.  9.  GOC - He had meeting with Palliative Care and Dr. Loleta Books on 10/24. He is now DNR but would like aggressive care up to and including VAD support if he would qualify but CKD and RV failure likely prohibit VAD and thus long-term options limited to hospice vs palliative home inotropes.   Length of Stay: [place] NP-C   06/28/2019, 8:08 AM  Advanced Heart Failure Team Pager [phone] (M-F; 7a - 4p)  Please contact [facility] for night-coverage after hours (4p -7a ) and weekends on [email or web address]  Patient seen with NP, agree with the above note.   He does not feel as well today, "gas pains" in abdomen. WBCs higher at 13.8, tbili higher at 6.7.  Afebrile.  CVP is low, about 4.  Co-ox not accurate this morning. He is off dobutamine.  Creatinine and BUN continue trend down.   General: NAD Neck: No JVD, no thyromegaly or thyroid nodule.  Lungs: Clear to auscultation bilaterally with normal respiratory effort. CV: Lateral PMI.  Heart regular S1/S2, no S3/S4, no murmur.  No peripheral edema.   Abdomen: Soft, nontender, no hepatosplenomegaly, mild distention.  Skin: Intact without lesions or rashes.   Neurologic: Alert and oriented x 3.  Psych: Normal affect. Extremities: No clubbing or cyanosis.  HEENT: Normal.   From a cardiac standpoint, he seems stable.  CVP down to 4.  With poor po intake today, will not give him torsemide today (can start torsemide 60 mg daily if creatinine stays stable and he has better po intake tomorrow).  Would hold off on restarting low dose Bidil with SBP 90s-100s.   I am concerned about recrudescence of cholangitis/cholecystitis, he feels overall worse today with increased abdominal symptoms and rising bilirubin and WBCs.   - He has been seen by GI, tells me that they are ordering an imaging test (no note in Epic yet).  - He is not on antibiotics at this point. Will defer to primary service but think he will need to restart.   He is on heparin gtt/warfarin overlap for LV thrombus.  INR 1.3.  Will hold warfarin tonight in case procedure is needed.  Will restart tomorrow if no procedure.   Loralie Champagne 06/28/2019 11:29 AM

## 2019-06-28 NOTE — Progress Notes (Signed)
CARDIAC REHAB PHASE I   PRE:  Rate/Rhythm: 102 ST  BP:  Sitting: 97/55      SaO2: 95% RA    Sitting Rhythm: 5 beat run of VT   BP Recheck: Sitting: 106/57     Pt willing to walk with Cardiac Rehab. Pt sates he wasn't feeling great and has been dizzy. Attempted to get pt standing at bedside to see if ambulation was possible. Pt had a 5 beat run of VT before standing. Decided to hold ambulation. BP was rechecked. It was 106/57. Gave pt ice and water per pt request. Will continue to follow pt to see when CR will be able to ambulate again. Call bell within reach at bedside.   [phone]  Carma Lair MS, ACSM CEP  10:20 AM 06/28/2019

## 2019-06-28 NOTE — Progress Notes (Signed)
PROGRESS NOTE    [name]  GEX:[id number] DOB: 05-May-1941 DOA: 06/18/2019 PCP: [name], MD      Brief Narrative:  [name] is a 78 y.o. M with HTN, sCHF EF 25%, CKD III baseline Cr 2 and alcohol use who presented with 4-5 days progressive abdominal pain with vomiting.  In the ER, US abdomen showed gallstones, was admitted for possible cholecystitis.      Interim summary: Initially admitted and started on empiric Abx for cholecystitis and cholangitis ERCP with stone removal, purulent biliary discharge --> NOTE: during ERCP, unable to inject dye to gallbladder, therefore got prolonged course abx  Subsequently abdomen way better but developed worsening swelling, renal failure, EF down to <20% --> decompensating CHF diagnosed as the cause Started dobutamine and IV Lasix and CO and UOP improved well, condition improved Titrated off dobutamine and Lasix  Abx stopped 10/27, subsequently TBili and WBC rising and today new abdominal discomfort...      Assessment & Plan:  Acute Cardiogenic Shock Decompensated Biventricular Failure LV thrombus Patient initially admitted for choledocholithiasis, but also thought to be fluid overloaded.  Started on Lasix, Cardiology consulted.  Subsequently, renal function worsened, diuretics were held, and he was started on fluids.  Echocardiogram obtained at that point showed EF <20% (down from baseline 25%) and signs of decompensated biventricular failure, and so dobutamine was started and Lasix restarted.  Since starting dobutamine, his creatinine has gotten steadily better and his edema has resolved.  Now back on oral diuretics.  Still on heparin bridge to warfarin. -Per HF service   Acute renal failure on CKD stage III Improving, Cr 2.78 today, baseline around 2 mg/dL.   Acute choledocholithiasis and cholangitis Had had sepsis from cholecystitis in 2019 with percutaneous cholecystostomy, no problems since.  Came in  this admission primarily due to cholecystitis, cholangitis.    Underwent ERCP on 10/20 by Dr. Ardis Hughs, biliary sphincterotomy, stone removal and noted purulence.  Also noted failure to opacify GB during ERCP.  Treated with Zosyn x4 days followed by Augmentin to complete 9 days.   Antibiotics were stopped and in monitoring him the last 2 days it is clear he is worsening.  Tbili had been steadily trending down but is now trending up 5.6 > 6.0 > 6.7 and also new WBC and nausea/abdominal discomfort. -Restart Abx, will use CTX rather than Unasyn, for sodium load -Trend CBC/LFTs -I have called GI back, appreciate their expert care -Image GB with ultrasound today -I will consult Gen Surg to help navigate the question of lap chole vs perc drain again   Diabetes Glucoses excellent -Continue SS insulin corrections  BPH / Urinary Retention  Chronic indwelling foley  -Continue Flomax, finasteride    Diarrhea Resolved   Ileus Resolved       DVT prophylaxis: On systemic anticoagulation Code Status: DNR Family Communication:       Consultants:   Cardiology  Gastroenterology  Nephrology  Palliative care   Procedures:   10/20 ERCP -- stones, purulent cholangitis  10/21 CT abdomen -- mild partial ileus  10/22 US renal  10/23 echo LVEF 10-15%.  Dilated LV.  RV moderately reduced systolic function and dilated.  IV septum flattened in diastole indicating volume overload.  L pleural effusion.  Apical thrombus.    10/23 PICC placement  Antimicrobials:   Zosyn 10/19 >> 10/22  Augmentin 10/23 >> 10/27  Ceftriaxone 10/29 >>  Culture data:   10/19 COVID >> negative   10/19 urine culture >>  klebsiella pneumoniae >> S-ceftriaxone, bactrim, zosyn  10/20 blood culture x2 -- pending / NGTD >>  10/23 urine culture >> negative     Subjective: He has new bloating and nausea today.  A lot of abdominal discomfort.  Swelling resolved, no orthopnea, chest discomfort,  palpitations.       Objective: Vitals:   06/28/19 0351 06/28/19 0814 06/28/19 1000 06/28/19 1009  BP: 125/68 (!) 114/59 (!) 97/55 (!) 106/57  Pulse: 97 (!) 101 (!) 103   Resp: (!) 24 18 (!) 30 (!) 25  Temp: 97.6 F (36.4 C) 97.9 F (36.6 C)    TempSrc: Oral Oral    SpO2: 97% 97% 95%   Weight: 79.5 kg     Height:        Intake/Output Summary (Last 24 hours) at 06/28/2019 1643 Last data filed at 06/28/2019 1500 Gross per 24 hour  Intake 908.34 ml  Output 4200 ml  Net -3291.66 ml   Filed Weights   06/26/19 0450 06/27/19 0500 06/28/19 0351  Weight: 82.1 kg 79.9 kg 79.5 kg    Examination: General appearance: Male, lying bed, appears uncomfortable, no obvious distress. HEENT: Mildly icteric, conjunctival, lids and lashes normal.  No nasal deformity, discharge, or epistaxis.  Lips normal, dentition in moderate repair, oropharynx tacky dry, no oral lesions, hearing normal. Skin:    Warm and dry, no suspicious rashes or lesions. Cardiac: RRR, S4 noted, no lower extremity edema Respiratory: Respirations normal, lungs clear without rales or wheezes.   Abdomen: Abdomen diffusely tender, bloated.  No voluntary guarding or rigidity. MSK: Thenar wasting noted, diffuse loss of subcutaneous muscle mass and fat. Neuro: Extraocular movements intact, moves all extremities with normal strength and coordination, speech fluent. Psych: Tension normal, affect normal, judgment and insight appear normal.     Data Reviewed: I have personally reviewed following labs and imaging studies:  CBC: Recent Labs  Lab 06/24/19 0627 06/25/19 0441 06/26/19 0430 06/27/19 0402 06/28/19 0410  WBC 5.7 6.3 7.5 10.3 13.8*  HGB 9.7* 10.5* 9.6* 8.8* 9.0*  HCT 28.3* 30.5* 28.9* 26.0* 28.1*  MCV 83.7 83.6 85.8 86.4 89.2  PLT 180 202 223 254 197   Basic Metabolic Panel: Recent Labs  Lab 06/24/19 0627 06/25/19 0441 06/26/19 0430 06/27/19 0402 06/28/19 0410 06/28/19 0920  NA 135 138 139 139 138   --   K 3.5 3.9 4.0 3.9 4.2  --   CL 100 99 98 96* 95*  --   CO2 23 27 31  33* 32  --   GLUCOSE 90 106* 129* 124* 108*  --   BUN 115* 109* 99* 91* 88*  --   CREATININE 4.85* 4.19* 3.39* 2.78* 2.57*  --   CALCIUM 8.0* 8.6* 8.5* 8.6* 8.8*  --   MG  --   --   --   --   --  1.9   GFR: Estimated Creatinine Clearance: 26.5 mL/min (A) (by C-G formula based on SCr of 2.57 mg/dL (H)). Liver Function Tests: Recent Labs  Lab 06/24/19 0627 06/25/19 0441 06/26/19 0430 06/27/19 0402 06/28/19 0410  AST 16 19 22 25 29   ALT 15 17 18 20 22   ALKPHOS 45 49 48 51 63  BILITOT 6.7* 6.5* 5.6* 6.0* 6.7*  PROT 6.0* 6.7 6.4* 6.2* 7.0  ALBUMIN 1.7* 1.8* 1.7* 1.7* 2.0*   No results for input(s): LIPASE, AMYLASE in the last 168 hours. No results for input(s): AMMONIA in the last 168 hours. Coagulation Profile: Recent Labs  Lab 06/26/19 0430  06/27/19 0402 06/28/19 0410  INR 1.3* 1.6* 1.3*   Cardiac Enzymes: No results for input(s): CKTOTAL, CKMB, CKMBINDEX, TROPONINI in the last 168 hours. BNP (last 3 results) No results for input(s): PROBNP in the last 8760 hours. HbA1C: No results for input(s): HGBA1C in the last 72 hours. CBG: Recent Labs  Lab 06/27/19 2034 06/27/19 2343 06/28/19 0408 06/28/19 0854 06/28/19 1341  GLUCAP 122* 119* 97 106* 122*   Lipid Profile: No results for input(s): CHOL, HDL, LDLCALC, TRIG, CHOLHDL, LDLDIRECT in the last 72 hours. Thyroid Function Tests: No results for input(s): TSH, T4TOTAL, FREET4, T3FREE, THYROIDAB in the last 72 hours. Anemia Panel: No results for input(s): VITAMINB12, FOLATE, FERRITIN, TIBC, IRON, RETICCTPCT in the last 72 hours. Urine analysis:    Component Value Date/Time   COLORURINE AMBER (A) 06/21/2019 0916   APPEARANCEUR CLOUDY (A) 06/21/2019 0916   LABSPEC 1.020 06/21/2019 0916   PHURINE 5.0 06/21/2019 0916   GLUCOSEU NEGATIVE 06/21/2019 0916   HGBUR MODERATE (A) 06/21/2019 0916   BILIRUBINUR SMALL (A) 06/21/2019 0916   KETONESUR  NEGATIVE 06/21/2019 0916   PROTEINUR 30 (A) 06/21/2019 0916   NITRITE NEGATIVE 06/21/2019 0916   LEUKOCYTESUR MODERATE (A) 06/21/2019 0916   Sepsis Labs: @LABRCNTIP (procalcitonin:4,lacticacidven:4)  ) Recent Results (from the past 240 hour(s))  Urine culture     Status: Abnormal   Collection Time: 06/18/19 11:05 PM   Specimen: Urine, Random  Result Value Ref Range Status   Specimen Description URINE, RANDOM  Final   Special Requests   Final    NONE Performed at [facility], [place] [address], [place], [place] [address]    Culture >=100,000 COLONIES/mL KLEBSIELLA PNEUMONIAE (A)  Final   Report Status 06/21/2019 FINAL  Final   Organism ID, Bacteria KLEBSIELLA PNEUMONIAE (A)  Final      Susceptibility   Klebsiella pneumoniae - MIC*    AMPICILLIN RESISTANT Resistant     CEFAZOLIN <=4 SENSITIVE Sensitive     CEFTRIAXONE <=1 SENSITIVE Sensitive     CIPROFLOXACIN <=0.25 SENSITIVE Sensitive     GENTAMICIN <=1 SENSITIVE Sensitive     IMIPENEM <=0.25 SENSITIVE Sensitive     NITROFURANTOIN 32 SENSITIVE Sensitive     TRIMETH/SULFA <=20 SENSITIVE Sensitive     AMPICILLIN/SULBACTAM 4 SENSITIVE Sensitive     PIP/TAZO <=4 SENSITIVE Sensitive     Extended ESBL NEGATIVE Sensitive     * >=100,000 COLONIES/mL KLEBSIELLA PNEUMONIAE  SARS Coronavirus 2 by RT PCR (hospital order, performed in [place] hospital lab) Nasopharyngeal Nasopharyngeal Swab     Status: None   Collection Time: 06/18/19 11:25 PM   Specimen: Nasopharyngeal Swab  Result Value Ref Range Status   SARS Coronavirus 2 NEGATIVE NEGATIVE Final    Comment: (NOTE) If result is NEGATIVE SARS-CoV-2 target nucleic acids are NOT DETECTED. The SARS-CoV-2 RNA is generally detectable in upper and lower  respiratory specimens during the acute phase of infection. The lowest  concentration of SARS-CoV-2 viral copies this assay can detect is 250  copies / mL. A negative result does not preclude SARS-CoV-2 infection  and should not  be used as the sole basis for treatment or other  patient management decisions.  A negative result may occur with  improper specimen collection / handling, submission of specimen other  than nasopharyngeal swab, presence of viral mutation(s) within the  areas targeted by this assay, and inadequate number of viral copies  (<250 copies / mL). A negative result must be combined with clinical  observations,  patient history, and epidemiological information. If result is POSITIVE SARS-CoV-2 target nucleic acids are DETECTED. The SARS-CoV-2 RNA is generally detectable in upper and lower  respiratory specimens dur ing the acute phase of infection.  Positive  results are indicative of active infection with SARS-CoV-2.  Clinical  correlation with patient history and other diagnostic information is  necessary to determine patient infection status.  Positive results do  not rule out bacterial infection or co-infection with other viruses. If result is PRESUMPTIVE POSTIVE SARS-CoV-2 nucleic acids MAY BE PRESENT.   A presumptive positive result was obtained on the submitted specimen  and confirmed on repeat testing.  While 2019 novel coronavirus  (SARS-CoV-2) nucleic acids may be present in the submitted sample  additional confirmatory testing may be necessary for epidemiological  and / or clinical management purposes  to differentiate between  SARS-CoV-2 and other Sarbecovirus currently known to infect humans.  If clinically indicated additional testing with an alternate test  methodology [phone]) is advised. The SARS-CoV-2 RNA is generally  detectable in upper and lower respiratory sp ecimens during the acute  phase of infection. The expected result is Negative. Fact Sheet for Patients:  StrictlyIdeas.no Fact Sheet for Healthcare Providers: BankingDealers.co.za This test is not yet approved or cleared by the [place] FDA and has been authorized  for detection and/or diagnosis of SARS-CoV-2 by FDA under an Emergency Use Authorization (EUA).  This EUA will remain in effect (meaning this test can be used) for the duration of the COVID-19 declaration under Section 564(b)(1) of the Act, 21 U.S.C. section 360bbb-3(b)(1), unless the authorization is terminated or revoked sooner. Performed at [facility], [place] [address], [place], [place] [address]   Culture, blood (routine x 2)     Status: None   Collection Time: 06/19/19  1:17 AM   Specimen: BLOOD LEFT HAND  Result Value Ref Range Status   Specimen Description BLOOD LEFT HAND  Final   Special Requests   Final    BOTTLES DRAWN AEROBIC AND ANAEROBIC Blood Culture results may not be optimal due to an inadequate volume of blood received in culture bottles   Culture   Final    NO GROWTH 5 DAYS Performed at [facility], [place] [address], [place], [place] [address]    Report Status 06/24/2019 FINAL  Final  Culture, blood (routine x 2)     Status: None   Collection Time: 06/19/19  1:30 AM   Specimen: BLOOD RIGHT HAND  Result Value Ref Range Status   Specimen Description BLOOD RIGHT HAND  Final   Special Requests   Final    BOTTLES DRAWN AEROBIC AND ANAEROBIC Blood Culture results may not be optimal due to an inadequate volume of blood received in culture bottles   Culture   Final    NO GROWTH 5 DAYS Performed at [facility], [place] [address] [place], [place] [address]    Report Status 06/24/2019 FINAL  Final  Culture, Urine     Status: None   Collection Time: 06/22/19  8:16 AM   Specimen: Urine, Catheterized  Result Value Ref Range Status   Specimen Description URINE, CATHETERIZED  Final   Special Requests NONE  Final   Culture   Final    NO GROWTH Performed at [facility], [address], [place], [place] [address]    Report Status 06/23/2019 FINAL  Final         Radiology Studies: No results found.  Scheduled Meds: . aspirin EC  81 mg  Oral Daily  . Chlorhexidine Gluconate Cloth  6 each Topical Daily  . feeding supplement (ENSURE ENLIVE)  237 mL Oral BID BM  . finasteride  5 mg Oral Daily  . insulin aspart  0-9 Units Subcutaneous Q4H  . multivitamin with minerals  1 tablet Oral Daily  . polycarbophil  625 mg Oral Daily  . saccharomyces boulardii  250 mg Oral BID  . sevelamer carbonate  800 mg Oral TID WC  . sodium chloride flush  10-40 mL Intracatheter Q12H  . sodium chloride flush  3 mL Intravenous Once  . tamsulosin  0.4 mg Oral Daily  . [START ON 06/29/2019] torsemide  60 mg Oral Daily   Continuous Infusions: . cefTRIAXone (ROCEPHIN)  IV    . heparin 1,700 Units/hr (06/28/19 0700)     LOS: 9 days    Time spent: 25 minutes    Myrene Buddy, MD   [facility] 06/28/2019, 4:43 PM     Please page through [place]:  [email or web address] If 7PM-7AM, please contact night-coverage

## 2019-06-28 NOTE — Progress Notes (Signed)
RE-Consultation/Progress note   Subjective     [name] is a 78 year old AA male with a past medical history of CKD, BPH obstruction requiring indwelling Foley catheter and others, who we were initially consulted on 06/19/2019 for choledocholithiasis.    It was discussed patient had a history of acute cholecystitis with sepsis and underwent 11/25/2017 percutaneous cholecystostomy tube placement.  Cholangiogram via PERC tube 12/27/2017 showed cholelithiasis, patent cystic and common bile ducts, patent cholecystostomy tube.  Repeat cholangiogram via tube 02/02/2018 again showing cholelithiasis, small, nonobstructing stones in the cystic duct and patent cystic and common bile ducts.  Percutaneous drain was pulled on 02/02/2018.  Admission with acute on chronic heart failure in mid June 2020.    At time of admission patient had started with mild abdominal pain and nonbloody emesis as well as fever and chills 4 days prior, the pain progressively worsened and he was admitted.  Abdominal ultrasound showed common bile duct 7 mm, mild intrahepatic biliary duct dilatation, slightly nodular liver, trace ascites and addended ultrasound showed cholelithiasis with a decompressed gallbladder.    At admission patient described that he had been using his sister's oxygen mostly at night.  Had worsening of weakness.    It was discussed he had complicated gallstone disease, previous acute cholecystitis treated with temporary drain antibiotics alone.  Then clearly presenting obstructing CBD stone causing pain, jaundice of the T bili 15.  Significant cardiac disease with EF on echo 06/22/2019 less than 20%, BNP elevated 2500    ERCP 06/19/2019 with choledocholithiasis and purulent cholangitis found and treated with a biliary sphincterotomy and balloon sweeping, cystic duct partially opacified but the gallbladder never opacified.  Recommended he continue IV antibiotics for another 48 hours given the purulent cholangitis.  Also  discussed that we should be on alert for signs of cholecystitis given nonopacification of the gallbladder.    06/22/2019 progress note by our service at that time patient has been afebrile nontender on exam and white count was normal so patient was changed to oral antibiotics, Augmentin twice daily for 10 days.  He was advanced to a heart healthy diet.  His bilirubin was slow to improve after ERCP/stone removal.  This is thought related to his rising creatinine for urine clearance of bili.    Today, patient's white blood cell count elevated from 10.3--> 13.8, he is afebrile.  He only received 8 days of antibiotics instead of 10.  Was discussed that patient has been put on Dobutamine, his creatinine has gotten steadily better and his edema has resolved he is still on heparin with goals to bridge to warfarin.  Creatinine improving.  Patient off of antibiotics yesterday, today white count increased, bilirubin back up from 6-->6.7.    At time of my interview patient tells me that he has had abdominal pain which seems to come and go when he passes gas.  Tells me that after eating anything his abdomen swells up with "gas" and then after a while he passes this and has a bowel movement and he goes back down.  He had just eaten before I went in the room and his abdomen was quite distended.  Tells me the pain comes and goes but it is "from gas".  This pain is all over his abdomen and feels like gas moving around.  Has been having bowel movements.  Did have an episode of nausea while walking around yesterday.  Denies vomiting.    Denies fever, chills or symptoms that awaken him  from sleep.   Objective   Vital signs in last 24 hours: Temp:  [97.5 F (36.4 C)-98.7 F (37.1 C)] 97.9 F (36.6 C) (10/29 0814) Pulse Rate:  [89-103] 103 (10/29 1000) Resp:  [15-30] 25 (10/29 1009) BP: (90-125)/(48-73) 106/57 (10/29 1009) SpO2:  [91 %-100 %] 95 % (10/29 1000) Weight:  [79.5 kg] 79.5 kg (10/29 0351) Last BM Date:  06/26/19 General:    AA male in NAD Heart:  Regular rate and rhythm; no murmurs Lungs: Respirations even and unlabored, lungs CTA bilaterally Abdomen:  Soft, mild generalized ttp and marked distension. Hypertympanic, increased BS all four quadrants Extremities:  Without edema. Neurologic:  Alert and oriented,  grossly normal neurologically. Psych:  Cooperative. Normal mood and affect.  Intake/Output from previous day: 10/28 0701 - 10/29 0700 In: 908.3 [P.O.:600; I.V.:308.3] Out: 2975 [Urine:2975]  Lab Results: Recent Labs    06/26/19 0430 06/27/19 0402 06/28/19 0410  WBC 7.5 10.3 13.8*  HGB 9.6* 8.8* 9.0*  HCT 28.9* 26.0* 28.1*  PLT 223 254 326   BMET Recent Labs    06/26/19 0430 06/27/19 0402 06/28/19 0410  NA 139 139 138  K 4.0 3.9 4.2  CL 98 96* 95*  CO2 31 33* 32  GLUCOSE 129* 124* 108*  BUN 99* 91* 88*  CREATININE 3.39* 2.78* 2.57*  CALCIUM 8.5* 8.6* 8.8*   LFT Recent Labs    06/28/19 0410  PROT 7.0  ALBUMIN 2.0*  AST 29  ALT 22  ALKPHOS 63  BILITOT 6.7*   PT/INR Recent Labs    06/27/19 0402 06/28/19 0410  LABPROT 19.0* 15.9*  INR 1.6* 1.3*     Assessment / Plan:   Assessment: 1.  Ascending cholangitis with choledocholithiasis: Status post ERCP and stone removal afebrile with elevating white blood cell count from yesterday to today, bili also increasing 2.  Hyperbilirubinemia: hgb 12.9--> 10.6--> 8.1--> 6.7--> 6.5--> 5.6--> 6--> 6.7 3.  Acute/chronic systolic CHF: Low output heart failure with cardiorenal syndrome with progressive worsening of renal function, diuresis and renal function improving now with inotropic therapy EF less than 20% this admission 4.  AKI: On CKD stage III 5.  LV thrombus: Warfarin started 10/27, bridged with IV heparin until INR therapeutic  Plan: 1.  Will discuss above with Dr. Rush Landmark, likely patient will require additional imaging. 2.  Please await further recommendations from Dr. Rush Landmark later today  Thank  you for your kind consultation, we will continue to follow.    LOS: 9 days   [name]  06/28/2019, 10:59 AM

## 2019-06-28 NOTE — Progress Notes (Signed)
Physical Therapy Treatment Patient Details Name: [name] MRN: [id number] DOB: 04-27-1941 Today's Date: 06/28/2019    History of Present Illness Pt is a 78 y.o. M with HTN, sCHF EF 25%, CKD III baseline Cr 2, DM, PVD and alcohol use who presented 06/18/19 with 4-5 days progressive abdominal pain with vomiting.  Found to have choledocholithiasis and underwent ERCP.  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   PT Comments    Pt slowly progressing with mobility; limited by fatigue this session, although denies dizziness with mobility as opposed to this morning. Static and dynamic stability improved with UE support. Pt hopeful for d/c home soon; reports he owns necessary DME. Will continue to follow acutely.  Supine BP 106/65 Post-mobility BP 110/66    Follow Up Recommendations  Home health PT;Supervision/Assistance - 24 hour     Equipment Recommendations  None recommended by PT    Recommendations for Other Services       Precautions / Restrictions Precautions Precautions: Fall Restrictions Weight Bearing Restrictions: No    Mobility  Bed Mobility Overal bed mobility: Modified Independent Bed Mobility: Supine to Sit              Transfers Overall transfer level: Needs assistance Equipment used: None Transfers: Sit to/from Stand Sit to Stand: Min guard            Ambulation/Gait Ambulation/Gait assistance: Min Web designer (Feet): 5 Feet Assistive device: 1 person hand held assist Gait Pattern/deviations: Step-through pattern;Decreased stride length;Trunk flexed Gait velocity: Decreased Gait velocity interpretation: <1.8 ft/sec, indicate of risk for recurrent falls General Gait Details: MinA only for HHA; walked to chair, declining further distance due to fatigue. Denies dizziness; BP 110/66   Stairs             Wheelchair Mobility    Modified Rankin (Stroke Patients Only)       Balance Overall balance  assessment: Needs assistance   Sitting balance-Leahy Scale: Good       Standing balance-Leahy Scale: Fair Standing balance comment: Can take stand without UE support; dynamic stability improved with UE support                            Cognition Arousal/Alertness: Awake/alert Behavior During Therapy: WFL for tasks assessed/performed Overall Cognitive Status: Within Functional Limits for tasks assessed                                        Exercises      General Comments General comments (skin integrity, edema, etc.): Supine BP 106/65, post-gait BP 110/66      Pertinent Vitals/Pain Pain Assessment: Faces Faces Pain Scale: Hurts little more Pain Location: Generalized Pain Descriptors / Indicators: Guarding;Sore Pain Intervention(s): Monitored during session;Limited activity within patient's tolerance    Home Living                      Prior Function            PT Goals (current goals can now be found in the care plan section) Progress towards PT goals: Progressing toward goals    Frequency    Min 3X/week      PT Plan Current plan remains appropriate    Co-evaluation              AM-PAC  PT "6 Clicks" Mobility   Outcome Measure  Help needed turning from your back to your side while in a flat bed without using bedrails?: None Help needed moving from lying on your back to sitting on the side of a flat bed without using bedrails?: None Help needed moving to and from a bed to a chair (including a wheelchair)?: A Little Help needed standing up from a chair using your arms (e.g., wheelchair or bedside chair)?: A Little Help needed to walk in hospital room?: A Little Help needed climbing 3-5 steps with a railing? : A Little 6 Click Score: 20    End of Session   Activity Tolerance: Patient limited by fatigue Patient left: in chair;with call bell/phone within reach Nurse Communication: Mobility status PT Visit  Diagnosis: Other abnormalities of gait and mobility (R26.89);Muscle weakness (generalized) (M62.81)     Time: 6440-3474 PT Time Calculation (min) (ACUTE ONLY): 21 min  Charges:  $Therapeutic Activity: 8-22 mins                    Mabeline Caras, PT, DPT Acute Rehabilitation Services  Pager [phone] Office [place] 06/28/2019, 5:35 PM

## 2019-06-28 NOTE — Progress Notes (Signed)
[name] for Heparin > holding warfarin Indication: LV thrombus  No Known Allergies  Patient Measurements: Height: 5\' 11"  (180.3 cm) Weight: 175 lb 3.2 oz (79.5 kg) IBW/kg (Calculated) : 75.3  Heparin dosing weight: 90 kg  Vital Signs: Temp: 97.9 F (36.6 C) (10/29 0814) Temp Source: Oral (10/29 0814) BP: 106/57 (10/29 1009) Pulse Rate: 103 (10/29 1000)  Labs: Recent Labs    06/26/19 0430 06/27/19 0401 06/27/19 0402 06/28/19 0410  HGB 9.6*  --  8.8* 9.0*  HCT 28.9*  --  26.0* 28.1*  PLT 223  --  254 326  LABPROT 16.2*  --  19.0* 15.9*  INR 1.3*  --  1.6* 1.3*  HEPARINUNFRC 0.34 0.56  --  0.64  CREATININE 3.39*  --  2.78* 2.57*    Estimated Creatinine Clearance: 26.5 mL/min (A) (by C-G formula based on SCr of 2.57 mg/dL (H)).  Assessment: 78 y.o. 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  Heparin level 0.64 at goal this AM, no overt bleeding or complications noted, h/h slight drop from yesterday will watch pltc stable.   Warfarin started 10/26- but then asked to stop today in case further GI procedures are needed.  INR 1.3  Goal of Therapy:  Heparin level 0.3-0.7 units/ml  INR 2-3 Monitor platelets by anticoagulation protocol: Yes   Plan:  Continue Heparin 1700 units/hr. Daily heparin level and CBC. F/u plans to resume warfarin after procedures completed.  [name], [facility] Clinical Pharmacist Phone [phone]  06/28/2019 12:50 PM

## 2019-06-28 NOTE — Progress Notes (Signed)
PT Cancellation Note  Patient Details Name: Deshaun Weisinger MRN: [id number] DOB: 03/14/1941   Cancelled Treatment:    Reason Eval/Treat Not Completed: Patient at procedure or test/unavailable. Will follow-up for PT treatment as schedule permits.  Mabeline Caras, PT, DPT Acute Rehabilitation Services  Pager [phone] Office [place] 06/28/2019, 3:51 PM

## 2019-06-28 NOTE — Care Management Important Message (Signed)
Important Message  Patient Details  Name: [name] MRN: [id number] Date of Birth: 1940-10-01   Medicare Important Message Given:  Yes     [name] 06/28/2019, 1:41 PM

## 2019-06-29 ENCOUNTER — Inpatient Hospital Stay (HOSPITAL_COMMUNITY): Payer: Medicare Other

## 2019-06-29 DIAGNOSIS — K8050 Calculus of bile duct without cholangitis or cholecystitis without obstruction: Secondary | ICD-10-CM | POA: Diagnosis not present

## 2019-06-29 DIAGNOSIS — N179 Acute kidney failure, unspecified: Secondary | ICD-10-CM | POA: Diagnosis not present

## 2019-06-29 DIAGNOSIS — R140 Abdominal distension (gaseous): Secondary | ICD-10-CM | POA: Diagnosis not present

## 2019-06-29 DIAGNOSIS — R945 Abnormal results of liver function studies: Secondary | ICD-10-CM

## 2019-06-29 DIAGNOSIS — I5043 Acute on chronic combined systolic (congestive) and diastolic (congestive) heart failure: Secondary | ICD-10-CM | POA: Diagnosis not present

## 2019-06-29 LAB — COMPREHENSIVE METABOLIC PANEL
ALT: 23 U/L (ref 0–44)
AST: 30 U/L (ref 15–41)
Albumin: 1.8 g/dL — ABNORMAL LOW (ref 3.5–5.0)
Alkaline Phosphatase: 57 U/L (ref 38–126)
Anion gap: 12 (ref 5–15)
BUN: 81 mg/dL — ABNORMAL HIGH (ref 8–23)
CO2: 30 mmol/L (ref 22–32)
Calcium: 8.4 mg/dL — ABNORMAL LOW (ref 8.9–10.3)
Chloride: 95 mmol/L — ABNORMAL LOW (ref 98–111)
Creatinine, Ser: 2.42 mg/dL — ABNORMAL HIGH (ref 0.61–1.24)
GFR calc Af Amer: 29 mL/min — ABNORMAL LOW (ref >60)
GFR calc non Af Amer: 25 mL/min — ABNORMAL LOW (ref >60)
Glucose, Bld: 108 mg/dL — ABNORMAL HIGH (ref 70–99)
Potassium: 4.1 mmol/L (ref 3.5–5.1)
Sodium: 137 mmol/L (ref 135–145)
Total Bilirubin: 5.3 mg/dL — ABNORMAL HIGH (ref 0.3–1.2)
Total Protein: 6.4 g/dL — ABNORMAL LOW (ref 6.5–8.1)

## 2019-06-29 LAB — CBC
HCT: 22.8 % — ABNORMAL LOW (ref 39.0–52.0)
HCT: 22.6 % — ABNORMAL LOW (ref 39.0–52.0)
Hemoglobin: 7.3 g/dL — ABNORMAL LOW (ref 13.0–17.0)
Hemoglobin: 7.1 g/dL — ABNORMAL LOW (ref 13.0–17.0)
MCH: 29.4 pg (ref 26.0–34.0)
MCH: 29.1 pg (ref 26.0–34.0)
MCHC: 32 g/dL (ref 30.0–36.0)
MCHC: 31.4 g/dL (ref 30.0–36.0)
MCV: 91.9 fL (ref 80.0–100.0)
MCV: 92.6 fL (ref 80.0–100.0)
Platelets: 315 10*3/uL (ref 150–400)
Platelets: 326 10*3/uL (ref 150–400)
RBC: 2.48 MIL/uL — ABNORMAL LOW (ref 4.22–5.81)
RBC: 2.44 MIL/uL — ABNORMAL LOW (ref 4.22–5.81)
RDW: 20.7 % — ABNORMAL HIGH (ref 11.5–15.5)
RDW: 20.6 % — ABNORMAL HIGH (ref 11.5–15.5)
WBC: 13.9 10*3/uL — ABNORMAL HIGH (ref 4.0–10.5)
WBC: 13.2 10*3/uL — ABNORMAL HIGH (ref 4.0–10.5)
nRBC: 0 % (ref 0.0–0.2)
nRBC: 0 % (ref 0.0–0.2)

## 2019-06-29 LAB — COOXEMETRY PANEL
Carboxyhemoglobin: 4.2 % — ABNORMAL HIGH (ref 0.5–1.5)
Methemoglobin: 0.9 % (ref 0.0–1.5)
O2 Saturation: 71.7 %
Total hemoglobin: 7.5 g/dL — ABNORMAL LOW (ref 12.0–16.0)

## 2019-06-29 LAB — GLUCOSE, CAPILLARY
Glucose-Capillary: 104 mg/dL — ABNORMAL HIGH (ref 70–99)
Glucose-Capillary: 125 mg/dL — ABNORMAL HIGH (ref 70–99)
Glucose-Capillary: 219 mg/dL — ABNORMAL HIGH (ref 70–99)
Glucose-Capillary: 136 mg/dL — ABNORMAL HIGH (ref 70–99)

## 2019-06-29 LAB — HEPATITIS A ANTIBODY, TOTAL: hep A Total Ab: NONREACTIVE

## 2019-06-29 LAB — BILIRUBIN, DIRECT: Bilirubin, Direct: 2.7 mg/dL — ABNORMAL HIGH (ref 0.0–0.2)

## 2019-06-29 LAB — IRON AND TIBC
Iron: 54 ug/dL (ref 45–182)
Saturation Ratios: 24 % (ref 17.9–39.5)
TIBC: 225 ug/dL — ABNORMAL LOW (ref 250–450)
UIBC: 171 ug/dL

## 2019-06-29 LAB — LACTATE DEHYDROGENASE: LDH: 108 U/L (ref 98–192)

## 2019-06-29 LAB — PROTIME-INR
INR: 1.3 — ABNORMAL HIGH (ref 0.8–1.2)
Prothrombin Time: 16 seconds — ABNORMAL HIGH (ref 11.4–15.2)

## 2019-06-29 LAB — GAMMA GT: GGT: 25 U/L (ref 7–50)

## 2019-06-29 LAB — FERRITIN: Ferritin: 495 ng/mL — ABNORMAL HIGH (ref 24–336)

## 2019-06-29 LAB — TRANSFERRIN: Transferrin: 161 mg/dL — ABNORMAL LOW (ref 180–329)

## 2019-06-29 LAB — HEPATITIS B SURFACE ANTIGEN: Hepatitis B Surface Ag: NONREACTIVE

## 2019-06-29 LAB — HEPARIN LEVEL (UNFRACTIONATED): Heparin Unfractionated: 0.56 IU/mL (ref 0.30–0.70)

## 2019-06-29 LAB — HEPATITIS B CORE ANTIBODY, TOTAL: Hep B Core Total Ab: NONREACTIVE

## 2019-06-29 MED ORDER — WARFARIN SODIUM 5 MG PO TABS
5.0000 mg | ORAL_TABLET | Freq: Once | ORAL | Status: AC
  Administered 2019-06-29: 5 mg via ORAL
  Filled 2019-06-29: qty 1

## 2019-06-29 MED ORDER — TORSEMIDE 20 MG PO TABS
40.0000 mg | ORAL_TABLET | Freq: Every day | ORAL | Status: DC
  Administered 2019-06-29 – 2019-07-01 (×3): 40 mg via ORAL
  Filled 2019-06-29 (×3): qty 2

## 2019-06-29 MED ORDER — SIMETHICONE 80 MG PO CHEW
160.0000 mg | CHEWABLE_TABLET | Freq: Four times a day (QID) | ORAL | Status: DC
  Administered 2019-06-29 (×2): 160 mg via ORAL
  Filled 2019-06-29 (×2): qty 2

## 2019-06-29 MED ORDER — WARFARIN - PHARMACIST DOSING INPATIENT
Freq: Every day | Status: DC
  Administered 2019-06-29 – 2019-07-06 (×5)

## 2019-06-29 NOTE — Progress Notes (Signed)
[name] for Heparin > warfarin Indication: LV thrombus  No Known Allergies  Patient Measurements: Height: 5\' 11"  (180.3 cm) Weight: 176 lb 9.4 oz (80.1 kg) IBW/kg (Calculated) : 75.3  Heparin dosing weight: 90 kg  Vital Signs: Temp: 98.8 F (37.1 C) (10/30 1602) Temp Source: Oral (10/30 1602) BP: 108/61 (10/30 1602) Pulse Rate: 100 (10/30 1602)  Labs: Recent Labs    06/27/19 0401  06/27/19 0402 06/28/19 0410 06/29/19 0454 06/29/19 0852  HGB  --    < > 8.8* 9.0* 7.3* 7.1*  HCT  --    < > 26.0* 28.1* 22.8* 22.6*  PLT  --    < > 254 326 315 326  LABPROT  --   --  19.0* 15.9* 16.0*  --   INR  --   --  1.6* 1.3* 1.3*  --   HEPARINUNFRC 0.56  --   --  0.64 0.56  --   CREATININE  --   --  2.78* 2.57* 2.42*  --    < > = values in this interval not displayed.    Estimated Creatinine Clearance: 28.1 mL/min (A) (by C-G formula based on SCr of 2.42 mg/dL (H)).  Assessment: 78 y.o. 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  Heparin level 0.56 at goal this AM, no overt bleeding or complications noted, h/h drop from yesterday to 7.3, repeat 7.1, pltc stable and wnl.   Warfarin started 10/26- but then asked to stop in case further GI procedures are needed.  INR 1.3. Dr. Aundra Dubin ok to resume warfarin today but with possible GI procedures and new drop in Hgb will hold off ordering for now pending GI recommendations.  Goal of Therapy:  Heparin level 0.3-0.7 units/ml  INR 2-3 Monitor platelets by anticoagulation protocol: Yes   Plan:  Continue Heparin 1700 units/hr. Daily heparin level and CBC. F/u plans to resume warfarin after procedures completed.  Vertis Kelch, PharmD PGY2 Cardiology Pharmacy Resident Phone [phone] 06/29/2019       4:49 PM  Please check [email or web address] for unit-specific pharmacist phone numbers       PM addendum Restart low dose warfarin tonight  Follow up h/h after drop today and GI recommendations  Warfarin 5mg   x1  Bonnita Nasuti Pharm.D. CPP, BCPS Clinical Pharmacist [phone] 06/29/2019 4:50 PM

## 2019-06-29 NOTE — Progress Notes (Signed)
CARDIAC REHAB PHASE I   PRE:  Rate/Rhythm: 112 ST  BP:  Sitting: 78/53      MODE:  Stood and Pivoted from [facility] to bed  POST:  Rate/Rhythm: 104 ST  BP:  Lying: 105/63     Pt found on BSC requesting to get off. Pt agreeable to walk. BP taken and noted to be 78/53. Asked pt again if he felt up to walking and pt stated "not really". Pt assisted back to bed, HR came up to 105/63. RN made aware of situation. Pt with call bell and bedside table within reach. Will continue to follow as a LP and encourage ambulation as able.  [place], RN BSN 06/29/2019 2:37 PM

## 2019-06-29 NOTE — Progress Notes (Signed)
Patient ID: Mendell Bontempo, male   DOB: 29-Oct-1940, 78 y.o.   MRN: [id number]    Advanced Heart Failure Rounding Note   Subjective:    Dobutamine discontinued. Has biventricular HF. Total bili  5.6>>6.0>>6.7>>5.3.  Co-ox 72%.  Creatinine continues to trend down, 2.42 today.  No diuretics yesterday, CVP 5 this morning.   Also treated for acending cholangitis this admit. S/p ERCP and stone removal. WBCs 13.9 today, afebrile. He had repeat RUQ [place] this morning, no evidence for acute cholecystitis.  He is back on ceftriaxone.  Less abdominal discomfort this morning and eating breakfast.   He is on heparin gtt, INR 1.3.   Objective:   Weight Range:  Vital Signs:   Temp:  [97.9 F (36.6 C)-99.1 F (37.3 C)] 97.9 F (36.6 C) (10/30 0417) Pulse Rate:  [96-112] 112 (10/30 0417) Resp:  [18-30] 19 (10/30 0525) BP: (97-113)/(55-63) 112/61 (10/30 0417) SpO2:  [95 %-98 %] 98 % (10/30 0417) Weight:  [80.1 kg] 80.1 kg (10/30 0417) Last BM Date: 06/28/19  Weight change: Filed Weights   06/27/19 0500 06/28/19 0351 06/29/19 0417  Weight: 79.9 kg 79.5 kg 80.1 kg    Intake/Output:   Intake/Output Summary (Last 24 hours) at 06/29/2019 0850 Last data filed at 06/29/2019 0600 Gross per 24 hour  Intake 477.21 ml  Output 2225 ml  Net -1747.79 ml   General: NAD Neck: No JVD, no thyromegaly or thyroid nodule.  Lungs: Clear to auscultation bilaterally with normal respiratory effort. CV: Lateral PMI.  Heart regular S1/S2, no S3/S4, no murmur.  No peripheral edema.   Abdomen: Soft, nontender, no hepatosplenomegaly, no distention.  Skin: Intact without lesions or rashes.  Neurologic: Alert and oriented x 3.  Psych: Normal affect. Extremities: No clubbing or cyanosis.  HEENT: Normal.   Telemetry: Sinus tachy 100s, personally reviewed.    Labs: Basic Metabolic Panel: Recent Labs  Lab 06/25/19 0441 06/26/19 0430 06/27/19 0402 06/28/19 0410 06/28/19 0920 06/29/19 0454  NA 138 139 139  138  --  137  K 3.9 4.0 3.9 4.2  --  4.1  CL 99 98 96* 95*  --  95*  CO2 27 31 33* 32  --  30  GLUCOSE 106* 129* 124* 108*  --  108*  BUN 109* 99* 91* 88*  --  81*  CREATININE 4.19* 3.39* 2.78* 2.57*  --  2.42*  CALCIUM 8.6* 8.5* 8.6* 8.8*  --  PENDING  MG  --   --   --   --  1.9  --     Liver Function Tests: Recent Labs  Lab 06/25/19 0441 06/26/19 0430 06/27/19 0402 06/28/19 0410 06/29/19 0454  AST 19 22 25 29 30   ALT 17 18 20 22 23   ALKPHOS 49 48 51 63 57  BILITOT 6.5* 5.6* 6.0* 6.7* 5.3*  PROT 6.7 6.4* 6.2* 7.0 6.4*  ALBUMIN 1.8* 1.7* 1.7* 2.0* 1.8*   No results for input(s): LIPASE, AMYLASE in the last 168 hours. No results for input(s): AMMONIA in the last 168 hours.  CBC: Recent Labs  Lab 06/25/19 0441 06/26/19 0430 06/27/19 0402 06/28/19 0410 06/29/19 0454  WBC 6.3 7.5 10.3 13.8* 13.9*  HGB 10.5* 9.6* 8.8* 9.0* 7.3*  HCT 30.5* 28.9* 26.0* 28.1* 22.8*  MCV 83.6 85.8 86.4 89.2 91.9  PLT 202 223 254 326 315    Cardiac Enzymes: No results for input(s): CKTOTAL, CKMB, CKMBINDEX, TROPONINI in the last 168 hours.  BNP: BNP (last 3 results) Recent Labs  08/16/18 0940 02/04/19 0906 06/18/19 2109  BNP 1,774.3* >4,500.0* 2,536.0*    ProBNP (last 3 results) No results for input(s): PROBNP in the last 8760 hours.    Other results:  Imaging: US Liver Doppler  Result Date: 06/28/2019 CLINICAL DATA:  Hyperbilirubinemia. History of choledocholithiasis. Abdominal pain. EXAM: DUPLEX ULTRASOUND OF LIVER TECHNIQUE: Color and duplex Doppler ultrasound was performed to evaluate the hepatic in-flow and out-flow vessels. COMPARISON:  CT 06/20/2019 and previous FINDINGS: Portal Vein 1.02 cm diameter. No evidence of occlusion or thrombus. Velocities (all hepatopetal): Main:  33-38 cm/sec Right:  25 cm/sec Left:  25 cm/sec Hepatic Vein Velocities (all hepatofugal): Right:  72 cm/sec Middle:  76 cm/sec Left:  44 cm/sec Intrahepatic IVC patent, velocity: 71 cm/sec  Hepatic Artery Velocity:  92 cm/sec Splenic Vein: No occlusion or thrombus.  Velocity: 31 cm/sec Spleen 8.5 x 5 x 8.6 cm (volume = 200 cm^3). Varices: None identified Ascites: Present Nodular liver contour. 1.2 cm right hepatic cyst. 1.1 cm echogenic lesion in the right lobe, possibly hemangioma but nonspecific; in retrospect, this was probably present on prior CT 11/23/2017. IMPRESSION: 1. Unremarkable hepatic vascular Doppler evaluation. 2. Abdominal ascites. 3. Nodular liver contour suggesting cirrhosis. Electronically Signed   By: Lucrezia Europe M.D.   On: 06/28/2019 16:48   US Abdomen Limited Ruq  Result Date: 06/29/2019 CLINICAL DATA:  History of cholelithiasis, choledocholithiasis and percutaneous cholecystostomy. Clinical concern for cholecystitis. EXAM: ULTRASOUND ABDOMEN LIMITED RIGHT UPPER QUADRANT COMPARISON:  Duplex liver ultrasound obtained yesterday. Abdomen and pelvis CT dated 06/20/2019. FINDINGS: Gallbladder: Contracted gallbladder containing a 2.2 cm densely shadowing stone. The stone is not visible on the CT other than questionable minimal calcific density in that region. Inferior to that level, there is dense barium in bowel loops on the CT. It is possible that the apparent stone could represent barium in 1 of the bowel loops extending into the region of the gallbladder fossa. The bowel or gallbladder wall is not well visualized, measuring approximately 4.1 mm in thickness. No pericholecystic fluid and no sonographic Murphy sign. Common bile duct: Diameter: 5.4 mm Liver: Mildly nodular contours are again demonstrated. 1.2 cm cyst in the right lobe and 1.0 cm cyst in the left lobe. Portal vein is patent on color Doppler imaging with normal direction of blood flow towards the liver. Other: Small to moderate amount of ascites. Increased echogenicity of the right kidney. IMPRESSION: 1. Contracted gallbladder containing a 2.2 cm calculus versus bowel loop containing retained barium in the region of  the gallbladder fossa. 2. No evidence of acute cholecystitis. 3. Mild nodular contours of the liver are again demonstrated, suggesting changes due to cirrhosis of the liver. 4. Small to moderate amount of ascites. 5. Echogenic right kidney, compatible with medical renal disease. Electronically Signed   By: Claudie Revering M.D.   On: 06/29/2019 07:47     Medications:     Scheduled Medications:  aspirin EC  81 mg Oral Daily   Chlorhexidine Gluconate Cloth  6 each Topical Daily   feeding supplement (ENSURE ENLIVE)  237 mL Oral BID BM   finasteride  5 mg Oral Daily   insulin aspart  0-9 Units Subcutaneous Q4H   multivitamin with minerals  1 tablet Oral Daily   polycarbophil  625 mg Oral Daily   saccharomyces boulardii  250 mg Oral BID   sevelamer carbonate  800 mg Oral TID WC   sodium chloride flush  10-40 mL Intracatheter Q12H   sodium chloride flush  3 mL Intravenous Once   tamsulosin  0.4 mg Oral Daily   torsemide  40 mg Oral Daily    Infusions:  cefTRIAXone (ROCEPHIN)  IV Stopped (06/28/19 1807)   heparin 1,700 Units/hr (06/29/19 0600)    PRN Medications: alum & mag hydroxide-simeth, ondansetron (ZOFRAN) IV, simethicone, sodium chloride flush   Assessment/Plan:    1. 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 Diureses and renal function improved w/ inotropic therapy.  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 Previously declined cath. He does, of note, have anterior Qs on ECG.  Not a LHC candidate due to baseline renal function. Echo this admission showed EF < 20%, severe LV dilation, small LV apical thrombus, severely dilated and dysfunctional RV with flattened septum. Dobutamine started on 10/23 and discontinued 10/28. Co-ox stable 72%, CVP 5.  Creatinine down to 2.42.  - Start torsemide 40 mg daily today.  - No ARB/ARNI or digoxin due to renal  function.  - Hold off on resuming Bidil, SBP a little better today and may start 1/2 tab tid tomorrow.   - Continue TED hose.  2. AKI: On CKD stage 3.  Initial creatinine 2.8, peaked 5.16. Trending down 4.85>>4.19>>3.39> 2.78>>2.57>>2.42. BUN trending down.  Suspect cardiorenal syndrome likely with ATN.  Baseline creatinine was 1.5 in 1/20 and 2.5 in 6/20. Dialysis would be very difficult for him with severe biventricular failure.  3. LV thrombus: warfarin started 10/27. Bridge w/ IV heparin until INR therapeutic. INR 1.3 today.  - Continue heparin gtt, restart warfarin today as it does not appear he will need a procedure.  4. Ascending cholangitis: S/p ERCP and stone removal. Abdominal US this morning did not show acute cholecystitis.  - Ceftriaxone has been restarted.  5. Hyperbilirubinemia: Likely due to combination of CBD obstruction (now relieved) and RV failure with hepatic congestion/cirrhosis. Total bilirubin lower today.  6. Hypokalemia: Resolved.  7. Anemia: Hgb 7.3 today, down significantly.  No overt bleeding.  - Will resend CBC this morning to confirm fall. Continue anticoagulation for now.  8. NSVT: in the setting of severe LV dysfunction. Keep K and Mg in normal range.   Loralie Champagne 06/29/2019 8:50 AM

## 2019-06-29 NOTE — Progress Notes (Signed)
Progress Note   Subjective  Chief Complaint: Hyperbilirubinemia  Today, the patient continues to tell me that he has "gas pain", explains that all the other doctors have told him that he is doing well and he is excited at the prospect of possibly going home on Monday.   Objective   Vital signs in last 24 hours: Temp:  [97.9 F (36.6 C)-99.1 F (37.3 C)] 97.9 F (36.6 C) (10/30 0417) Pulse Rate:  [96-112] 112 (10/30 0417) Resp:  [18-23] 19 (10/30 0525) BP: (106-113)/(58-63) 112/61 (10/30 0417) SpO2:  [95 %-98 %] 98 % (10/30 0417) Weight:  [80.1 kg] 80.1 kg (10/30 0417) Last BM Date: 06/28/19 General: AA male in NAD Heart:  Regular rate and rhythm; no murmurs Lungs: Respirations even and unlabored, lungs CTA bilaterally Abdomen:  Soft, nontender and mild distension. Normal bowel sounds. Extremities:  Without edema. Neurologic:  Alert and oriented,  grossly normal neurologically. Psych:  Cooperative. Normal mood and affect.  Intake/Output from previous day: 10/29 0701 - 10/30 0700 In: 477.2 [I.V.:377.2; IV Piggyback:100] Out: 2225 [Urine:2225]  Lab Results: Recent Labs    06/28/19 0410 06/29/19 0454 06/29/19 0852  WBC 13.8* 13.9* 13.2*  HGB 9.0* 7.3* 7.1*  HCT 28.1* 22.8* 22.6*  PLT 326 315 326   BMET Recent Labs    06/27/19 0402 06/28/19 0410 06/29/19 0454  NA 139 138 137  K 3.9 4.2 4.1  CL 96* 95* 95*  CO2 33* 32 30  GLUCOSE 124* 108* 108*  BUN 91* 88* 81*  CREATININE 2.78* 2.57* 2.42*  CALCIUM 8.6* 8.8* 8.4*   Hepatic Function Latest Ref Rng & Units 06/29/2019 06/28/2019 06/27/2019  Total Protein 6.5 - 8.1 g/dL 6.4(L) 7.0 6.2(L)  Albumin 3.5 - 5.0 g/dL 1.8(L) 2.0(L) 1.7(L)  AST 15 - 41 U/L 30 29 25   ALT 0 - 44 U/L 23 22 20   Alk Phosphatase 38 - 126 U/L 57 63 51  Total Bilirubin 0.3 - 1.2 mg/dL 5.3(H) 6.7(H) 6.0(H)  Bilirubin, Direct 0.0 - 0.2 mg/dL 2.7(H) 3.0(H) -    PT/INR Recent Labs    06/28/19 0410 06/29/19 0454  LABPROT 15.9* 16.0*    INR 1.3* 1.3*    Studies/Results: US Liver Doppler  Result Date: 06/28/2019 CLINICAL DATA:  Hyperbilirubinemia. History of choledocholithiasis. Abdominal pain. EXAM: DUPLEX ULTRASOUND OF LIVER TECHNIQUE: Color and duplex Doppler ultrasound was performed to evaluate the hepatic in-flow and out-flow vessels. COMPARISON:  CT 06/20/2019 and previous FINDINGS: Portal Vein 1.02 cm diameter. No evidence of occlusion or thrombus. Velocities (all hepatopetal): Main:  33-38 cm/sec Right:  25 cm/sec Left:  25 cm/sec Hepatic Vein Velocities (all hepatofugal): Right:  72 cm/sec Middle:  76 cm/sec Left:  44 cm/sec Intrahepatic IVC patent, velocity: 71 cm/sec Hepatic Artery Velocity:  92 cm/sec Splenic Vein: No occlusion or thrombus.  Velocity: 31 cm/sec Spleen 8.5 x 5 x 8.6 cm (volume = 200 cm^3). Varices: None identified Ascites: Present Nodular liver contour. 1.2 cm right hepatic cyst. 1.1 cm echogenic lesion in the right lobe, possibly hemangioma but nonspecific; in retrospect, this was probably present on prior CT 11/23/2017. IMPRESSION: 1. Unremarkable hepatic vascular Doppler evaluation. 2. Abdominal ascites. 3. Nodular liver contour suggesting cirrhosis. Electronically Signed   By: Lucrezia Europe M.D.   On: 06/28/2019 16:48   US Abdomen Limited Ruq  Result Date: 06/29/2019 CLINICAL DATA:  History of cholelithiasis, choledocholithiasis and percutaneous cholecystostomy. Clinical concern for cholecystitis. EXAM: ULTRASOUND ABDOMEN LIMITED RIGHT UPPER QUADRANT COMPARISON:  Duplex liver ultrasound  obtained yesterday. Abdomen and pelvis CT dated 06/20/2019. FINDINGS: Gallbladder: Contracted gallbladder containing a 2.2 cm densely shadowing stone. The stone is not visible on the CT other than questionable minimal calcific density in that region. Inferior to that level, there is dense barium in bowel loops on the CT. It is possible that the apparent stone could represent barium in 1 of the bowel loops extending into the  region of the gallbladder fossa. The bowel or gallbladder wall is not well visualized, measuring approximately 4.1 mm in thickness. No pericholecystic fluid and no sonographic Murphy sign. Common bile duct: Diameter: 5.4 mm Liver: Mildly nodular contours are again demonstrated. 1.2 cm cyst in the right lobe and 1.0 cm cyst in the left lobe. Portal vein is patent on color Doppler imaging with normal direction of blood flow towards the liver. Other: Small to moderate amount of ascites. Increased echogenicity of the right kidney. IMPRESSION: 1. Contracted gallbladder containing a 2.2 cm calculus versus bowel loop containing retained barium in the region of the gallbladder fossa. 2. No evidence of acute cholecystitis. 3. Mild nodular contours of the liver are again demonstrated, suggesting changes due to cirrhosis of the liver. 4. Small to moderate amount of ascites. 5. Echogenic right kidney, compatible with medical renal disease. Electronically Signed   By: Claudie Revering M.D.   On: 06/29/2019 07:47    Assessment / Plan:   Assessment: 1.  Ascending cholangitis with choledocholithiasis: Status post ERCP and stone removal afebrile with elevating white blood cell count improving some today, bili getting better today 2.  Hyperbilirubinemia: Bilirubin 6.7-->5.3 today 3.  Acute/chronic systolic CHF: Low output heart failure with cardiorenal syndrome with progressive worsening of renal function, diuresis and renal function improving now with inotropic therapy EF less than 20% 4.  AKI: On CKD stage III 5.  LV thrombus: Warfarin started 10/27  Plan: 1. Please await recommendations from Dr. Rush Landmark.  Thank you for your kind consult.    LOS: 10 days   [name]  06/29/2019, 11:33 AM

## 2019-06-29 NOTE — Progress Notes (Signed)
[place] for Heparin > holding warfarin Indication: LV thrombus  No Known Allergies  Patient Measurements: Height: 5\' 11"  (180.3 cm) Weight: 176 lb 9.4 oz (80.1 kg) IBW/kg (Calculated) : 75.3  Heparin dosing weight: 90 kg  Vital Signs: Temp: 97.9 F (36.6 C) (10/30 0417) Temp Source: Oral (10/30 0417) BP: 112/61 (10/30 0417) Pulse Rate: 112 (10/30 0417)  Labs: Recent Labs    06/27/19 0401  06/27/19 0402 06/28/19 0410 06/29/19 0454 06/29/19 0852  HGB  --    < > 8.8* 9.0* 7.3* 7.1*  HCT  --    < > 26.0* 28.1* 22.8* 22.6*  PLT  --    < > 254 326 315 326  LABPROT  --   --  19.0* 15.9* 16.0*  --   INR  --   --  1.6* 1.3* 1.3*  --   HEPARINUNFRC 0.56  --   --  0.64 0.56  --   CREATININE  --   --  2.78* 2.57* 2.42*  --    < > = values in this interval not displayed.    Estimated Creatinine Clearance: 28.1 mL/min (A) (by C-G formula based on SCr of 2.42 mg/dL (H)).  Assessment: 78 y.o. 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  Heparin level 0.56 at goal this AM, no overt bleeding or complications noted, h/h drop from yesterday to 7.3, repeat 7.1, pltc stable and wnl.   Warfarin started 10/26- but then asked to stop in case further GI procedures are needed.  INR 1.3. Dr. Aundra Dubin ok to resume warfarin today but with possible GI procedures and new drop in Hgb will hold off ordering for now pending GI recommendations.  Goal of Therapy:  Heparin level 0.3-0.7 units/ml  INR 2-3 Monitor platelets by anticoagulation protocol: Yes   Plan:  Continue Heparin 1700 units/hr. Daily heparin level and CBC. F/u plans to resume warfarin after procedures completed.  Vertis Kelch, PharmD PGY2 Cardiology Pharmacy Resident Phone [phone] 06/29/2019       10:34 AM  Please check [email or web address] for unit-specific pharmacist phone numbers

## 2019-06-29 NOTE — Progress Notes (Signed)
Palliative follow up.  Chart reviewed.   Discussed with Dr. Cathlean Sauer.  Patient awake and alert as I enter the room.  His blanket is covering his head.  He states he is feeling ok, but has gaseous distension and pain in his stomach.  He has had this problem in the past to a much lesser degree.  He has been up to chair today.    We discussed the ups and downs of his hospitalization.  He has come a long way thru choledocholithiasis, sepsis, heart failure, renal failure, liver failure.  We discussed his current blood loss briefly.    He anticipates going home to his house.  I'm concerned that even when he improves he will still need more support than being at home alone.  Patient awake, alert, orientated, coherent 2+ scleral icterus CV rrr resp no distress Abdomen distended, firm, no bowel sounds noted.   PMT will continue to follow intermittently for support and goals of care discussions as needed.  Florentina Jenny, PA-C Palliative Medicine Pager: [phone]  15 min.  Greater than 50%  of this time was spent counseling and coordinating care related to the above assessment and plan

## 2019-06-29 NOTE — Progress Notes (Signed)
PROGRESS NOTE    [name]  ZOX:[id number] DOB: July 02, 1941 DOA: 06/18/2019 PCP: [name], MD    Brief Narrative:  78 year old male who presented with abdominal pain.  He does have significant past medical history for hypertension, dyslipidemia, type 2 diabetes mellitus, chronic kidney disease stage III, peripheral vascular disease, chronic systolic heart failure, alcohol abuse, and history of cholecystitis status post cholecystostomy tube March 2019.  He reported ongoing pain for the last for 5 days, right upper quadrant, constant with no fevers and no chills.  On his initial physical examination he was afebrile, heart rate 118, respiratory 22, blood pressure 129/92, oxygenation 90% on room air.  His lungs are clear to auscultation, heart S1-S2 present rhythm, abdomen was slightly tender, soft, no rebound or guarding, no lower extremity edema.  Lipase 220, AST 31, ALT 37, total bilirubin is 13.4.  CT of the abdomen with biliary dilatation due to choledocholithiasis (13 mm stone present at the distal common bile duct).  Positive ascites.   Patient was admitted to the hospital with a working diagnosis of abdominal pain, rule out cholecystitis.  Patient was treated with intravenous antibiotics and underwent ERCP (10/20) with stone removal, noted purulent biliary drain. Patient was treated with IV zosyn for 4 days then with Augmentin for 9 to complete 9 days. Patient also had an ileus, that now has resolved.   During the course of his hospitalization he developed signs of hypervolemia and renal failure, consistent with decompensated systolic heart failure/ acute cardiogenic shock.  He required inotropic support with dobutamine and further diuresis with IV furosemide.   Assessment & Plan:   Active Problems:   Hyperlipidemia   Diabetes mellitus without complication (HCC)   CKD (chronic kidney disease), stage III (HCC)   AKI (acute kidney injury) ([place])   Acute on chronic systolic  and diastolic heart failure, NYHA class 1 ([place])   Essential hypertension   Acute renal failure (ARF) (HCC)   Abdominal pain   Acute lower UTI   Choledocholithiasis   Cholangitis due to bile duct calculus with obstruction   Cardiogenic shock ([place])   Dysphagia   Palliative care encounter   1. Acute decompensated systolic heart failure, cardiogenic shock, complicated with small LV thrombus. Patient this am with no dyspnea or chest pain, clinically euvolemic. Urine output over last 24 H is 2,225 ml with a net fluid balance of negative 25,139 ml. Renal function with serum cr at 2,42 with K at 4,1 and serum bicarbonate at 30. Blood pressure systolic 811 mmHg. Continue with torsemide 40 mg daily. Continue anticoagulation with heparin IV.   2. AKI on CKD stage 3. Improved renal function, with serum cr down to 2,42 from 2,57, positive metabolic alkalosis, continue diuresis with torsemide, follow on renal panel in am, avoid hypotension or nephrotoxic medications. Continue sevelamer.   3. Acute choledocholithiasis and cholangitis. Patient now sp ERCP, he had antibiotic therapy with good toleration, T Bil continue to trend down to 5,3 from 6,7, AST 30 and ALT 23. WBC stable at 13. Imaging per [place] on 10/19 with decompressed gallbladder. Probably can dc antibiotic therapy, will follow with GI recommendations. Not a current candidate for surgery due to recent heart failure decompensation.   4. T2DM. Will continue glucose cover and monitoring with insulin sliding scale, patient is tolerating po well.   5. BPH with chronic urinary retention. Chronic foley catheter. Continue foley catheter care per protocol, continue tamsulosin and finesteride.   6. Undefined calorie protein malnutrition. Continue  with nutritional supplements.    DVT prophylaxis: IV heparin   Code Status: dnr  Family Communication: no family at the bedside  Disposition Plan/ discharge barriers: pending clinical improvement.   Body mass  index is 24.63 kg/m. Malnutrition Type:  Nutrition Problem: Inadequate oral intake Etiology: altered GI function   Malnutrition Characteristics:  Signs/Symptoms: meal completion < 25%   Nutrition Interventions:  Interventions: Ensure Enlive (each supplement provides 350kcal and 20 grams of protein), Magic cup, MVI  RN Pressure Injury Documentation:     Consultants:   GI   Cardiology   Procedures:   ERCP   Antimicrobials:   IV ceftriaxone     Subjective: Patient with no abdominal pain and tolerating po well, no dyspnea or chest pain, continue to be very weak and deconditioned.   Objective: Vitals:   06/28/19 1949 06/28/19 2347 06/29/19 0417 06/29/19 0525  BP: 106/63 (!) 113/58 112/61   Pulse: 96 (!) 103 (!) 112   Resp: 18 19 (!) 23 19  Temp: 99.1 F (37.3 C) 98.5 F (36.9 C) 97.9 F (36.6 C)   TempSrc: Oral Oral Oral   SpO2: 96% 95% 98%   Weight:   80.1 kg   Height:        Intake/Output Summary (Last 24 hours) at 06/29/2019 0910 Last data filed at 06/29/2019 0600 Gross per 24 hour  Intake 477.21 ml  Output 2225 ml  Net -1747.79 ml   Filed Weights   06/27/19 0500 06/28/19 0351 06/29/19 0417  Weight: 79.9 kg 79.5 kg 80.1 kg    Examination:   General: Not in pain or dyspnea, deconditioned  Neurology: Awake and alert, non focal  E ENT: mild pallor, no icterus, oral mucosa moist Cardiovascular: No JVD. S1-S2 present, rhythmic, no gallops, rubs, or murmurs. No lower extremity edema. Pulmonary: positive breath sounds bilaterally, adequate air movement, no wheezing, rhonchi or rales. Gastrointestinal. Abdomen mild distention with no organomegaly, non tender, no rebound or guarding Skin. No rashes Musculoskeletal: no joint deformities     Data Reviewed: I have personally reviewed following labs and imaging studies  CBC: Recent Labs  Lab 06/25/19 0441 06/26/19 0430 06/27/19 0402 06/28/19 0410 06/29/19 0454  WBC 6.3 7.5 10.3 13.8* 13.9*   HGB 10.5* 9.6* 8.8* 9.0* 7.3*  HCT 30.5* 28.9* 26.0* 28.1* 22.8*  MCV 83.6 85.8 86.4 89.2 91.9  PLT 202 223 254 326 725   Basic Metabolic Panel: Recent Labs  Lab 06/25/19 0441 06/26/19 0430 06/27/19 0402 06/28/19 0410 06/28/19 0920 06/29/19 0454  NA 138 139 139 138  --  137  K 3.9 4.0 3.9 4.2  --  4.1  CL 99 98 96* 95*  --  95*  CO2 27 31 33* 32  --  30  GLUCOSE 106* 129* 124* 108*  --  108*  BUN 109* 99* 91* 88*  --  81*  CREATININE 4.19* 3.39* 2.78* 2.57*  --  2.42*  CALCIUM 8.6* 8.5* 8.6* 8.8*  --  PENDING  MG  --   --   --   --  1.9  --    GFR: Estimated Creatinine Clearance: 28.1 mL/min (A) (by C-G formula based on SCr of 2.42 mg/dL (H)). Liver Function Tests: Recent Labs  Lab 06/25/19 0441 06/26/19 0430 06/27/19 0402 06/28/19 0410 06/29/19 0454  AST 19 22 25 29 30   ALT 17 18 20 22 23   ALKPHOS 49 48 51 63 57  BILITOT 6.5* 5.6* 6.0* 6.7* 5.3*  PROT 6.7 6.4* 6.2* 7.0  6.4*  ALBUMIN 1.8* 1.7* 1.7* 2.0* 1.8*   No results for input(s): LIPASE, AMYLASE in the last 168 hours. No results for input(s): AMMONIA in the last 168 hours. Coagulation Profile: Recent Labs  Lab 06/26/19 0430 06/27/19 0402 06/28/19 0410 06/29/19 0454  INR 1.3* 1.6* 1.3* 1.3*   Cardiac Enzymes: No results for input(s): CKTOTAL, CKMB, CKMBINDEX, TROPONINI in the last 168 hours. BNP (last 3 results) No results for input(s): PROBNP in the last 8760 hours. HbA1C: No results for input(s): HGBA1C in the last 72 hours. CBG: Recent Labs  Lab 06/28/19 1341 06/28/19 1731 06/28/19 1951 06/28/19 2350 06/29/19 0423  GLUCAP 122* 96 138* 113* 104*   Lipid Profile: No results for input(s): CHOL, HDL, LDLCALC, TRIG, CHOLHDL, LDLDIRECT in the last 72 hours. Thyroid Function Tests: No results for input(s): TSH, T4TOTAL, FREET4, T3FREE, THYROIDAB in the last 72 hours. Anemia Panel: No results for input(s): VITAMINB12, FOLATE, FERRITIN, TIBC, IRON, RETICCTPCT in the last 72 hours.     Radiology Studies: I have reviewed all of the imaging during this hospital visit personally     Scheduled Meds: . aspirin EC  81 mg Oral Daily  . Chlorhexidine Gluconate Cloth  6 each Topical Daily  . feeding supplement (ENSURE ENLIVE)  237 mL Oral BID BM  . finasteride  5 mg Oral Daily  . insulin aspart  0-9 Units Subcutaneous Q4H  . multivitamin with minerals  1 tablet Oral Daily  . polycarbophil  625 mg Oral Daily  . saccharomyces boulardii  250 mg Oral BID  . sevelamer carbonate  800 mg Oral TID WC  . sodium chloride flush  10-40 mL Intracatheter Q12H  . sodium chloride flush  3 mL Intravenous Once  . tamsulosin  0.4 mg Oral Daily  . torsemide  40 mg Oral Daily   Continuous Infusions: . cefTRIAXone (ROCEPHIN)  IV Stopped (06/28/19 1807)  . heparin 1,700 Units/hr (06/29/19 0900)     LOS: 10 days        Mauricio Gerome Apley, MD

## 2019-06-30 ENCOUNTER — Inpatient Hospital Stay (HOSPITAL_COMMUNITY): Payer: Medicare Other

## 2019-06-30 DIAGNOSIS — N179 Acute kidney failure, unspecified: Secondary | ICD-10-CM | POA: Diagnosis not present

## 2019-06-30 DIAGNOSIS — K922 Gastrointestinal hemorrhage, unspecified: Secondary | ICD-10-CM

## 2019-06-30 DIAGNOSIS — I5043 Acute on chronic combined systolic (congestive) and diastolic (congestive) heart failure: Secondary | ICD-10-CM | POA: Diagnosis not present

## 2019-06-30 DIAGNOSIS — R195 Other fecal abnormalities: Secondary | ICD-10-CM

## 2019-06-30 DIAGNOSIS — D649 Anemia, unspecified: Secondary | ICD-10-CM

## 2019-06-30 HISTORY — DX: Gastrointestinal hemorrhage, unspecified: K92.2

## 2019-06-30 LAB — CBC
HCT: 21.3 % — ABNORMAL LOW (ref 39.0–52.0)
Hemoglobin: 6.7 g/dL — CL (ref 13.0–17.0)
MCH: 29.5 pg (ref 26.0–34.0)
MCHC: 31.5 g/dL (ref 30.0–36.0)
MCV: 93.8 fL (ref 80.0–100.0)
Platelets: 352 10*3/uL (ref 150–400)
RBC: 2.27 MIL/uL — ABNORMAL LOW (ref 4.22–5.81)
RDW: 20.9 % — ABNORMAL HIGH (ref 11.5–15.5)
WBC: 13 10*3/uL — ABNORMAL HIGH (ref 4.0–10.5)
nRBC: 0 % (ref 0.0–0.2)

## 2019-06-30 LAB — COMPREHENSIVE METABOLIC PANEL
ALT: 27 U/L (ref 0–44)
AST: 37 U/L (ref 15–41)
Albumin: 1.7 g/dL — ABNORMAL LOW (ref 3.5–5.0)
Alkaline Phosphatase: 54 U/L (ref 38–126)
Anion gap: 11 (ref 5–15)
BUN: 70 mg/dL — ABNORMAL HIGH (ref 8–23)
CO2: 29 mmol/L (ref 22–32)
Calcium: 8.4 mg/dL — ABNORMAL LOW (ref 8.9–10.3)
Chloride: 97 mmol/L — ABNORMAL LOW (ref 98–111)
Creatinine, Ser: 2.16 mg/dL — ABNORMAL HIGH (ref 0.61–1.24)
GFR calc Af Amer: 33 mL/min — ABNORMAL LOW (ref >60)
GFR calc non Af Amer: 29 mL/min — ABNORMAL LOW (ref >60)
Glucose, Bld: 107 mg/dL — ABNORMAL HIGH (ref 70–99)
Potassium: 4.1 mmol/L (ref 3.5–5.1)
Sodium: 137 mmol/L (ref 135–145)
Total Bilirubin: 4.4 mg/dL — ABNORMAL HIGH (ref 0.3–1.2)
Total Protein: 6.5 g/dL (ref 6.5–8.1)

## 2019-06-30 LAB — PROTIME-INR
INR: 1.3 — ABNORMAL HIGH (ref 0.8–1.2)
Prothrombin Time: 16.1 seconds — ABNORMAL HIGH (ref 11.4–15.2)

## 2019-06-30 LAB — GLUCOSE, CAPILLARY
Glucose-Capillary: 102 mg/dL — ABNORMAL HIGH (ref 70–99)
Glucose-Capillary: 103 mg/dL — ABNORMAL HIGH (ref 70–99)
Glucose-Capillary: 111 mg/dL — ABNORMAL HIGH (ref 70–99)
Glucose-Capillary: 114 mg/dL — ABNORMAL HIGH (ref 70–99)
Glucose-Capillary: 130 mg/dL — ABNORMAL HIGH (ref 70–99)
Glucose-Capillary: 195 mg/dL — ABNORMAL HIGH (ref 70–99)

## 2019-06-30 LAB — PREPARE RBC (CROSSMATCH)

## 2019-06-30 LAB — ABO/RH: ABO/RH(D): A POS

## 2019-06-30 LAB — HEPARIN LEVEL (UNFRACTIONATED): Heparin Unfractionated: 0.65 IU/mL (ref 0.30–0.70)

## 2019-06-30 LAB — OCCULT BLOOD X 1 CARD TO LAB, STOOL: Fecal Occult Bld: POSITIVE — AB

## 2019-06-30 MED ORDER — WARFARIN SODIUM 5 MG PO TABS
5.0000 mg | ORAL_TABLET | Freq: Once | ORAL | Status: AC
  Administered 2019-06-30: 5 mg via ORAL
  Filled 2019-06-30: qty 1

## 2019-06-30 MED ORDER — SIMETHICONE 80 MG PO CHEW
160.0000 mg | CHEWABLE_TABLET | Freq: Three times a day (TID) | ORAL | Status: DC
  Administered 2019-06-30 – 2019-07-09 (×33): 160 mg via ORAL
  Filled 2019-06-30 (×33): qty 2

## 2019-06-30 MED ORDER — PANTOPRAZOLE SODIUM 40 MG IV SOLR
40.0000 mg | Freq: Two times a day (BID) | INTRAVENOUS | Status: DC
  Administered 2019-06-30 – 2019-07-03 (×7): 40 mg via INTRAVENOUS
  Filled 2019-06-30 (×7): qty 40

## 2019-06-30 MED ORDER — SODIUM CHLORIDE 0.9 % IV SOLN
INTRAVENOUS | Status: DC
  Administered 2019-06-30: 20:00:00 via INTRAVENOUS

## 2019-06-30 MED ORDER — ISOSORB DINITRATE-HYDRALAZINE 20-37.5 MG PO TABS
0.5000 | ORAL_TABLET | Freq: Three times a day (TID) | ORAL | Status: DC
  Administered 2019-06-30 – 2019-07-09 (×26): 0.5 via ORAL
  Filled 2019-06-30 (×28): qty 1

## 2019-06-30 MED ORDER — SODIUM CHLORIDE 0.9% IV SOLUTION
Freq: Once | INTRAVENOUS | Status: AC
  Administered 2019-07-06: 16:00:00 via INTRAVENOUS

## 2019-06-30 NOTE — Progress Notes (Signed)
ANTICOAGULATION CONSULT NOTE   Pharmacy Consult for Heparin > warfarin Indication: LV thrombus  No Known Allergies  Patient Measurements: Height: 5\' 11"  (180.3 cm) Weight: 174 lb 9.7 oz (79.2 kg) IBW/kg (Calculated) : 75.3  Heparin dosing weight: 90 kg  Vital Signs: Temp: 98.3 F (36.8 C) (10/31 0812) Temp Source: Oral (10/31 0812) BP: 110/58 (10/31 0812) Pulse Rate: 51 (10/31 0812)  Labs: Recent Labs    06/28/19 0410 06/29/19 0454 06/29/19 0852 06/30/19 0428  HGB 9.0* 7.3* 7.1* 6.7*  HCT 28.1* 22.8* 22.6* 21.3*  PLT 326 315 326 352  LABPROT 15.9* 16.0*  --  16.1*  INR 1.3* 1.3*  --  1.3*  HEPARINUNFRC 0.64 0.56  --  0.65  CREATININE 2.57* 2.42*  --  2.16*    Estimated Creatinine Clearance: 31.5 mL/min (A) (by C-G formula based on SCr of 2.16 mg/dL (H)).  Assessment: 78 y.o. 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 Heparin level 0.65 at goal this AM.  Warfarin started 10/30. INR today is 1.3. Dr. Aundra Dubin ok to continue warfarin today but would like to stop heparin due to continued drop in Hg. Pt receiving 1 unit or PRBC now. Hg is low 9>7.3>7.1>6.7. No overt sings of bleeding per RN.  Goal of Therapy:  INR 2-3 Monitor platelets by anticoagulation protocol: Yes   Plan:  Discontinue heparin Give warfarin 5 mg x 1 tonight Daily Pt-INR F/u GI recommendations and the need to restart heparin since INR is subtherapeutic Monitor CBC daily Watch for signs/symptoms of bleeding  Sherren Kerns, PharmD PGY1 [place] Resident 06/30/2019       8:46 AM

## 2019-06-30 NOTE — Progress Notes (Signed)
Patient ID: [name], male   DOB: 14-Jan-1941, 78 y.o.   MRN: [id number]    Advanced Heart Failure Rounding Note   Subjective:    Dobutamine discontinued. Has biventricular HF. Total bili  5.6>>6.0>>6.7>>5.3>>4.4.  Creatinine continues to trend down, 2.16 today.  CVP 6 today.   Also treated for acending cholangitis this admit. S/p ERCP and stone removal. WBCs 13 today, afebrile. He had repeat RUQ [place], no evidence for acute cholecystitis.  He is off abx.  Still with abdominal discomfort, attributes to gas.   Hgb down to 6.7, nurse reports a dark stool.  INR 1.3 today, he has been on heparin gtt for LV thrombus.   Objective:   Weight Range:  Vital Signs:   Temp:  [97.8 F (36.6 C)-98.8 F (37.1 C)] 97.8 F (36.6 C) (10/31 0925) Pulse Rate:  [51-104] 104 (10/31 0925) Resp:  [18-25] 20 (10/31 0925) BP: (101-114)/(58-67) 107/59 (10/31 0925) SpO2:  [96 %-100 %] 100 % (10/31 0925) Weight:  [79.2 kg] 79.2 kg (10/31 0528) Last BM Date: 06/29/19  Weight change: Filed Weights   06/28/19 0351 06/29/19 0417 06/30/19 0528  Weight: 79.5 kg 80.1 kg 79.2 kg    Intake/Output:   Intake/Output Summary (Last 24 hours) at 06/30/2019 0946 Last data filed at 06/30/2019 0925 Gross per 24 hour  Intake 740 ml  Output 1700 ml  Net -960 ml   General: NAD Neck: No JVD, no thyromegaly or thyroid nodule.  Lungs: Clear to auscultation bilaterally with normal respiratory effort. CV: Lateral PMI.  Heart regular S1/S2, no S3/S4, no murmur.  No peripheral edema.   Abdomen: Soft, nontender, no hepatosplenomegaly, no distention.  Skin: Intact without lesions or rashes.  Neurologic: Alert and oriented x 3.  Psych: Normal affect. Extremities: No clubbing or cyanosis.  HEENT: Normal.   Telemetry: Sinus tachy 100s, personally reviewed.    Labs: Basic Metabolic Panel: Recent Labs  Lab 06/26/19 0430 06/27/19 0402 06/28/19 0410 06/28/19 0920 06/29/19 0454 06/30/19 0428  NA 139 139 138  --   137 137  K 4.0 3.9 4.2  --  4.1 4.1  CL 98 96* 95*  --  95* 97*  CO2 31 33* 32  --  30 29  GLUCOSE 129* 124* 108*  --  108* 107*  BUN 99* 91* 88*  --  81* 70*  CREATININE 3.39* 2.78* 2.57*  --  2.42* 2.16*  CALCIUM 8.5* 8.6* 8.8*  --  8.4* 8.4*  MG  --   --   --  1.9  --   --     Liver Function Tests: Recent Labs  Lab 06/26/19 0430 06/27/19 0402 06/28/19 0410 06/29/19 0454 06/30/19 0428  AST 22 25 29 30  37  ALT 18 20 22 23 27   ALKPHOS 48 51 63 57 54  BILITOT 5.6* 6.0* 6.7* 5.3* 4.4*  PROT 6.4* 6.2* 7.0 6.4* 6.5  ALBUMIN 1.7* 1.7* 2.0* 1.8* 1.7*   No results for input(s): LIPASE, AMYLASE in the last 168 hours. No results for input(s): AMMONIA in the last 168 hours.  CBC: Recent Labs  Lab 06/27/19 0402 06/28/19 0410 06/29/19 0454 06/29/19 0852 06/30/19 0428  WBC 10.3 13.8* 13.9* 13.2* 13.0*  HGB 8.8* 9.0* 7.3* 7.1* 6.7*  HCT 26.0* 28.1* 22.8* 22.6* 21.3*  MCV 86.4 89.2 91.9 92.6 93.8  PLT 254 326 315 326 352    Cardiac Enzymes: No results for input(s): CKTOTAL, CKMB, CKMBINDEX, TROPONINI in the last 168 hours.  BNP: BNP (last 3 results)  Recent Labs    08/16/18 0940 02/04/19 0906 06/18/19 2109  BNP 1,774.3* >4,500.0* 2,536.0*    ProBNP (last 3 results) No results for input(s): PROBNP in the last 8760 hours.    Other results:  Imaging: US Liver Doppler  Result Date: 06/28/2019 CLINICAL DATA:  Hyperbilirubinemia. History of choledocholithiasis. Abdominal pain. EXAM: DUPLEX ULTRASOUND OF LIVER TECHNIQUE: Color and duplex Doppler ultrasound was performed to evaluate the hepatic in-flow and out-flow vessels. COMPARISON:  CT 06/20/2019 and previous FINDINGS: Portal Vein 1.02 cm diameter. No evidence of occlusion or thrombus. Velocities (all hepatopetal): Main:  33-38 cm/sec Right:  25 cm/sec Left:  25 cm/sec Hepatic Vein Velocities (all hepatofugal): Right:  72 cm/sec Middle:  76 cm/sec Left:  44 cm/sec Intrahepatic IVC patent, velocity: 71 cm/sec Hepatic  Artery Velocity:  92 cm/sec Splenic Vein: No occlusion or thrombus.  Velocity: 31 cm/sec Spleen 8.5 x 5 x 8.6 cm (volume = 200 cm^3). Varices: None identified Ascites: Present Nodular liver contour. 1.2 cm right hepatic cyst. 1.1 cm echogenic lesion in the right lobe, possibly hemangioma but nonspecific; in retrospect, this was probably present on prior CT 11/23/2017. IMPRESSION: 1. Unremarkable hepatic vascular Doppler evaluation. 2. Abdominal ascites. 3. Nodular liver contour suggesting cirrhosis. Electronically Signed   By: Lucrezia Europe M.D.   On: 06/28/2019 16:48   US Abdomen Limited Ruq  Result Date: 06/29/2019 CLINICAL DATA:  History of cholelithiasis, choledocholithiasis and percutaneous cholecystostomy. Clinical concern for cholecystitis. EXAM: ULTRASOUND ABDOMEN LIMITED RIGHT UPPER QUADRANT COMPARISON:  Duplex liver ultrasound obtained yesterday. Abdomen and pelvis CT dated 06/20/2019. FINDINGS: Gallbladder: Contracted gallbladder containing a 2.2 cm densely shadowing stone. The stone is not visible on the CT other than questionable minimal calcific density in that region. Inferior to that level, there is dense barium in bowel loops on the CT. It is possible that the apparent stone could represent barium in 1 of the bowel loops extending into the region of the gallbladder fossa. The bowel or gallbladder wall is not well visualized, measuring approximately 4.1 mm in thickness. No pericholecystic fluid and no sonographic Murphy sign. Common bile duct: Diameter: 5.4 mm Liver: Mildly nodular contours are again demonstrated. 1.2 cm cyst in the right lobe and 1.0 cm cyst in the left lobe. Portal vein is patent on color Doppler imaging with normal direction of blood flow towards the liver. Other: Small to moderate amount of ascites. Increased echogenicity of the right kidney. IMPRESSION: 1. Contracted gallbladder containing a 2.2 cm calculus versus bowel loop containing retained barium in the region of the  gallbladder fossa. 2. No evidence of acute cholecystitis. 3. Mild nodular contours of the liver are again demonstrated, suggesting changes due to cirrhosis of the liver. 4. Small to moderate amount of ascites. 5. Echogenic right kidney, compatible with medical renal disease. Electronically Signed   By: Claudie Revering M.D.   On: 06/29/2019 07:47     Medications:     Scheduled Medications:  sodium chloride   Intravenous Once   Chlorhexidine Gluconate Cloth  6 each Topical Daily   feeding supplement (ENSURE ENLIVE)  237 mL Oral BID BM   finasteride  5 mg Oral Daily   insulin aspart  0-9 Units Subcutaneous Q4H   multivitamin with minerals  1 tablet Oral Daily   polycarbophil  625 mg Oral Daily   saccharomyces boulardii  250 mg Oral BID   sevelamer carbonate  800 mg Oral TID WC   simethicone  160 mg Oral TID AC & HS  sodium chloride flush  10-40 mL Intracatheter Q12H   sodium chloride flush  3 mL Intravenous Once   tamsulosin  0.4 mg Oral Daily   torsemide  40 mg Oral Daily   warfarin  5 mg Oral ONCE-1800   Warfarin - Pharmacist Dosing Inpatient   Does not apply q1800    Infusions:   PRN Medications: alum & mag hydroxide-simeth, ondansetron (ZOFRAN) IV, sodium chloride flush   Assessment/Plan:    1. 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 Diureses and renal function improved w/ inotropic therapy.  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 Previously declined cath. He does, of note, have anterior Qs on ECG.  Not a LHC candidate due to baseline renal function. Echo this admission showed EF < 20%, severe LV dilation, small LV apical thrombus, severely dilated and dysfunctional RV with flattened septum. Dobutamine started on 10/23 and discontinued 10/28. No co-ox today, CVP 6.  Creatinine down to 2.16.  - Continue torsemide 40 mg daily.  - No ARB/ARNI or  digoxin due to renal function.  - Will start low dose Bidil, 1/2 tab tid.     - Continue TED hose.  2. AKI: On CKD stage 3.  Initial creatinine 2.8, peaked 5.16. Trending down 4.85>>4.19>>3.39> 2.78>>2.57>>2.42>>2.16. BUN trending down.  Suspect cardiorenal syndrome likely with ATN.  Baseline creatinine was 1.5 in 1/20 and 2.5 in 6/20. Dialysis would be very difficult for him with severe biventricular failure.  3. LV thrombus: warfarin started 10/27 and has been on IV heparin. INR 1.3.  - Stop heparin gtt with anemia, ?GI bleed.  - Can get warfarin tonight unless GI plans evaluation or he has definite bleeding.  4. Ascending cholangitis: S/p ERCP and stone removal. Abdominal US 10/30 did not show acute cholecystitis. He is off antibiotics.  5. Hyperbilirubinemia: Likely due to combination of CBD obstruction (now relieved) and RV failure with hepatic congestion/cirrhosis. Total bilirubin lower today.  6. Hypokalemia: Resolved.  7. Anemia: Hgb 6.7 today. Nurse reports dark stool.  ?GI bleeding.   - He is getting 1 unit PRBCs.  - Primary service has called GI again.  8. NSVT: in the setting of severe LV dysfunction. Keep K and Mg in normal range.   Loralie Champagne 06/30/2019 9:46 AM

## 2019-06-30 NOTE — Progress Notes (Signed)
PROGRESS NOTE    [name]  WUG:[id number] DOB: Sep 17, 1940 DOA: 06/18/2019 PCP: [name], MD    Brief Narrative:  78 year old male who presented with abdominal pain.  He does have significant past medical history for hypertension, dyslipidemia, type 2 diabetes mellitus, chronic kidney disease stage III, peripheral vascular disease, chronic systolic heart failure, alcohol abuse, and history of cholecystitis status post cholecystostomy tube March 2019.  He reported ongoing pain for the last for 5 days, right upper quadrant, constant with no fevers and no chills.  On his initial physical examination he was afebrile, heart rate 118, respiratory 22, blood pressure 129/92, oxygenation 90% on room air.  His lungs are clear to auscultation, heart S1-S2 present rhythm, abdomen was slightly tender, soft, no rebound or guarding, no lower extremity edema.  Lipase 220, AST 31, ALT 37, total bilirubin is 13.4.  CT of the abdomen with biliary dilatation due to choledocholithiasis (13 mm stone present at the distal common bile duct).  Positive ascites.   Patient was admitted to the hospital with a working diagnosis of abdominal pain, rule out cholecystitis.  Patient was treated with intravenous antibiotics and underwent ERCP (10/20) with stone removal, noted purulent biliary drain. Patient was treated with IV zosyn for 4 days then with Augmentin and completed 9 days. Patient also had an ileus, that now has resolved.   During the course of his hospitalization he developed signs of hypervolemia and renal failure, consistent with decompensated systolic heart failure/ acute cardiogenic shock.  He required inotropic support with dobutamine and further diuresis with IV furosemide.   Patient had worsening anemia with Hgb down to 6,9, 10/31,  while on heparin drip, positive bloody bowel movement. Heparin will be discontinued and patient will receive one unit or PRBC (10/31).     Assessment &  Plan:   Principal Problem:   Cholangitis due to bile duct calculus with obstruction Active Problems:   Hyperlipidemia   Diabetes mellitus without complication (HCC)   CKD (chronic kidney disease), stage III (HCC)   AKI (acute kidney injury) ([place])   Acute on chronic systolic and diastolic heart failure, NYHA class 1 (HCC)   Essential hypertension   Acute renal failure (ARF) (HCC)   Abdominal pain   Acute lower UTI   Choledocholithiasis   Cardiogenic shock (HCC)   Dysphagia   Palliative care encounter   GI bleed with acute blood loss anemia, suspected upper   1. New acute blood loss anemia, suspected upper GI bleed. Patient with bloody bowel movement per nursing report, his Hgb has trend down, from 9,0 to 7,3, to 7,1 and today to 6,7. Has been on therapeutic anticoagulation for LV thrombus. Will add pantoprazole IV q12, re-consulted GI and transfused one unit PRBC. Patient will need prolonged anticoagulation for LV thrombus. Discontinue aspirin.   2. Acute decompensated systolic heart failure, cardiogenic shock, complicated with small LV thrombus, EF left ventricle is less than 20% with severe cavity dilation/ severe RV dilatation and reduced systolic function.  His urine output over last 24 H is 1,700 ml with a net fluid balance of negative 26,629 ml since admission. Stable blood pressure systolic 888 to 280 mmHg. Tolerating well diuresis with torsemide 40 mg daily. Patient has been started on low dose bidil.  2. AKI on CKD stage 3. Renal function with serum cr at 2,16 with positive metabolic alkalosis, serum bicarbonate 29. Will continue diuresis with torsemide. Continue sevelamer.   3. Acute choledocholithiasis and cholangitis. Patient with no signs  of current infected biliary tree, no leukocytosis or worsening abdominal pain. Will dc antibiotic therapy, currently he is not a good candidate for surgery due to recent heart failure decompensation. Plan to follow with GI as outpatient.  Continue to trend liver profile.   4. T2DM. Fasting glucose is 107 mg/dl, will continue glucose cover and monitoring with insulin sliding scale.   5. BPH with chronic urinary retention. Chronic foley catheter.  Foley catheter in place continue care per protocol. On tamsulosin and finesteride.   6. Undefined calorie protein malnutrition. On nutritional supplements.   DVT prophylaxis: scd   Code Status: dnr  Family Communication: no family at the bedside  Disposition Plan/ discharge barriers: when stable patient will be discharged home with home health services.    Body mass index is 24.35 kg/m. Malnutrition Type:  Nutrition Problem: Inadequate oral intake Etiology: altered GI function   Malnutrition Characteristics:  Signs/Symptoms: meal completion < 25%   Nutrition Interventions:  Interventions: Ensure Enlive (each supplement provides 350kcal and 20 grams of protein), Magic cup, MVI  RN Pressure Injury Documentation:     Consultants:   Cardiology   GI   Procedures:   ERCP   Antimicrobials:       Subjective: Patient is feeling tired, no dyspnea, no chest pain, no nausea or vomiting, mild abdominal distention but no abdominal pain. Per nursing patient had a large bloody bowel movement.   Objective: Vitals:   06/30/19 0422 06/30/19 0528 06/30/19 0812 06/30/19 0854  BP: (!) 112/59  (!) 110/58 108/60  Pulse:   (!) 51 (!) 101  Resp: 18  (!) 21 (!) 25  Temp: 98.2 F (36.8 C)  98.3 F (36.8 C) 98.4 F (36.9 C)  TempSrc: Oral  Oral Oral  SpO2: 96%  98% 98%  Weight:  79.2 kg    Height:        Intake/Output Summary (Last 24 hours) at 06/30/2019 0915 Last data filed at 06/30/2019 0656 Gross per 24 hour  Intake 360 ml  Output 1700 ml  Net -1340 ml   Filed Weights   06/28/19 0351 06/29/19 0417 06/30/19 0528  Weight: 79.5 kg 80.1 kg 79.2 kg    Examination:   General: Not in pain or dyspnea. Deconditioned  Neurology: Awake and alert, non focal   E ENT: mild pallor, no icterus, oral mucosa moist Cardiovascular: No JVD. S1-S2 present, rhythmic, no gallops, rubs, or murmurs. No lower extremity edema. Pulmonary: positive breath sounds bilaterally, adequate air movement, no wheezing, rhonchi or rales. Gastrointestinal. Abdomen mild distended with no organomegaly, non tender, no rebound or guarding Skin. No rashes Musculoskeletal: no joint deformities     Data Reviewed: I have personally reviewed following labs and imaging studies  CBC: Recent Labs  Lab 06/27/19 0402 06/28/19 0410 06/29/19 0454 06/29/19 0852 06/30/19 0428  WBC 10.3 13.8* 13.9* 13.2* 13.0*  HGB 8.8* 9.0* 7.3* 7.1* 6.7*  HCT 26.0* 28.1* 22.8* 22.6* 21.3*  MCV 86.4 89.2 91.9 92.6 93.8  PLT 254 326 315 326 177   Basic Metabolic Panel: Recent Labs  Lab 06/26/19 0430 06/27/19 0402 06/28/19 0410 06/28/19 0920 06/29/19 0454 06/30/19 0428  NA 139 139 138  --  137 137  K 4.0 3.9 4.2  --  4.1 4.1  CL 98 96* 95*  --  95* 97*  CO2 31 33* 32  --  30 29  GLUCOSE 129* 124* 108*  --  108* 107*  BUN 99* 91* 88*  --  81* 70*  CREATININE 3.39* 2.78* 2.57*  --  2.42* 2.16*  CALCIUM 8.5* 8.6* 8.8*  --  8.4* 8.4*  MG  --   --   --  1.9  --   --    GFR: Estimated Creatinine Clearance: 31.5 mL/min (A) (by C-G formula based on SCr of 2.16 mg/dL (H)). Liver Function Tests: Recent Labs  Lab 06/26/19 0430 06/27/19 0402 06/28/19 0410 06/29/19 0454 06/30/19 0428  AST 22 25 29 30  37  ALT 18 20 22 23 27   ALKPHOS 48 51 63 57 54  BILITOT 5.6* 6.0* 6.7* 5.3* 4.4*  PROT 6.4* 6.2* 7.0 6.4* 6.5  ALBUMIN 1.7* 1.7* 2.0* 1.8* 1.7*   No results for input(s): LIPASE, AMYLASE in the last 168 hours. No results for input(s): AMMONIA in the last 168 hours. Coagulation Profile: Recent Labs  Lab 06/26/19 0430 06/27/19 0402 06/28/19 0410 06/29/19 0454 06/30/19 0428  INR 1.3* 1.6* 1.3* 1.3* 1.3*   Cardiac Enzymes: No results for input(s): CKTOTAL, CKMB, CKMBINDEX,  TROPONINI in the last 168 hours. BNP (last 3 results) No results for input(s): PROBNP in the last 8760 hours. HbA1C: No results for input(s): HGBA1C in the last 72 hours. CBG: Recent Labs  Lab 06/29/19 1601 06/29/19 2010 06/30/19 0025 06/30/19 0446 06/30/19 0814  GLUCAP 219* 136* 114* 111* 103*   Lipid Profile: No results for input(s): CHOL, HDL, LDLCALC, TRIG, CHOLHDL, LDLDIRECT in the last 72 hours. Thyroid Function Tests: No results for input(s): TSH, T4TOTAL, FREET4, T3FREE, THYROIDAB in the last 72 hours. Anemia Panel: Recent Labs    06/29/19 0423  FERRITIN 495*  TIBC 225*  IRON 54      Radiology Studies: I have reviewed all of the imaging during this hospital visit personally     Scheduled Meds: . sodium chloride   Intravenous Once  . aspirin EC  81 mg Oral Daily  . Chlorhexidine Gluconate Cloth  6 each Topical Daily  . feeding supplement (ENSURE ENLIVE)  237 mL Oral BID BM  . finasteride  5 mg Oral Daily  . insulin aspart  0-9 Units Subcutaneous Q4H  . multivitamin with minerals  1 tablet Oral Daily  . polycarbophil  625 mg Oral Daily  . saccharomyces boulardii  250 mg Oral BID  . sevelamer carbonate  800 mg Oral TID WC  . simethicone  160 mg Oral TID AC & HS  . sodium chloride flush  10-40 mL Intracatheter Q12H  . sodium chloride flush  3 mL Intravenous Once  . tamsulosin  0.4 mg Oral Daily  . torsemide  40 mg Oral Daily  . warfarin  5 mg Oral ONCE-1800  . Warfarin - Pharmacist Dosing Inpatient   Does not apply q1800   Continuous Infusions:   LOS: 11 days        Mauricio Gerome Apley, MD

## 2019-06-30 NOTE — H&P (View-Only) (Signed)
[name] Progress Note  CC:  Anemia, GI bleed s/p ERCP with sphincterotomy   Subjective:  Patient is eating solid food upon entering room. No nausea or vomiting. He describes having generalized abdominal gas discomfort. No significant pain. RN reports he passed a solid black stool yesterday evening. No further BM since then. No rectal bleeding or frank melena.   Objective:   Hg 6.7 today down from 7.3. INR 1.3. 1 unit of PRBCs ordered by hospitalist Heparin IV dc'd. Patient remains hemodynamically stable Foley catheter draining approximately 300cc yellow orange urine  KUB this am: Negative for free air. Bowel gas throughout the abdomen with a nonobstructive pattern. Residual oral contrast in the sigmoid colon demonstrates numerous colonic diverticula. Slight medialization of the bowel structures probably related to the known ascites. Evidence for urinary Foley catheter. Cardiac silhouette appears to be large for size.  Vital signs in last 24 hours: Temp:  [97.8 F (36.6 C)-98.8 F (37.1 C)] 98.4 F (36.9 C) (10/31 0940) Pulse Rate:  [51-104] 103 (10/31 0940) Resp:  [18-25] 22 (10/31 0940) BP: (101-114)/(58-67) 110/62 (10/31 0940) SpO2:  [96 %-100 %] 96 % (10/31 0940) Weight:  [79.2 kg] 79.2 kg (10/31 0528) Last BM Date: 06/29/19 General:   Alert, ill appearing male in NAD Eyes: Sclera mildly icteric, cataracts  Mouth: poor dentition, no ulcers or lesions Heart: Tachycardic, no murmur  Pulm: Breath sounds clear throughout, slightly diminished LLL. Abdomen: Protuberant, slightly tight,  tympanic to percussion, + BS x 4 quads, patient actively passed gas per the rectum during exam Rectum: Solid black stool, sent to lab for guaiac testing. Large firm prostate.  Extremities:  Without edema. Neurologic:  Alert and  oriented x4;  grossly normal neurologically. Psych:  Alert and cooperative. Normal mood and affect.  Intake/Output from previous day: 10/30  0701 - 10/31 0700 In: 360 [P.O.:160; I.V.:200] Out: 1700 [Urine:1700] Intake/Output this shift: Total I/O In: 380 [Blood:380] Out: -   Lab Results: Recent Labs    06/29/19 0454 06/29/19 0852 06/30/19 0428  WBC 13.9* 13.2* 13.0*  HGB 7.3* 7.1* 6.7*  HCT 22.8* 22.6* 21.3*  PLT 315 326 352   BMET Recent Labs    06/28/19 0410 06/29/19 0454 06/30/19 0428  NA 138 137 137  K 4.2 4.1 4.1  CL 95* 95* 97*  CO2 32 30 29  GLUCOSE 108* 108* 107*  BUN 88* 81* 70*  CREATININE 2.57* 2.42* 2.16*  CALCIUM 8.8* 8.4* 8.4*   LFT Recent Labs    06/29/19 0454 06/30/19 0428  PROT 6.4* 6.5  ALBUMIN 1.8* 1.7*  AST 30 37  ALT 23 27  ALKPHOS 57 54  BILITOT 5.3* 4.4*  BILIDIR 2.7*  --    PT/INR Recent Labs    06/29/19 0454 06/30/19 0428  LABPROT 16.0* 16.1*  INR 1.3* 1.3*   Hepatitis Panel Recent Labs    06/29/19 0454  HEPBSAG NON REACTIVE    Dg Abd 2 Views  Result Date: 06/30/2019 CLINICAL DATA:  Abdominal distension. EXAM: ABDOMEN - 2 VIEW COMPARISON:  CT of the abdomen pelvis 06/20/2019 and ultrasound 06/29/2019 FINDINGS: Negative for free air. Bowel gas throughout the abdomen with a nonobstructive pattern. Residual oral contrast in the sigmoid colon demonstrates numerous colonic diverticula. Slight medialization of the bowel structures probably related to the known ascites. Evidence for urinary Foley catheter. Cardiac silhouette appears to be large for size. IMPRESSION: Nonobstructive bowel gas pattern. Colonic diverticula. Electronically Signed   By: [name].D.  On: 06/30/2019 09:51   US Liver Doppler  Result Date: 06/28/2019 CLINICAL DATA:  Hyperbilirubinemia. History of choledocholithiasis. Abdominal pain. EXAM: DUPLEX ULTRASOUND OF LIVER TECHNIQUE: Color and duplex Doppler ultrasound was performed to evaluate the hepatic in-flow and out-flow vessels. COMPARISON:  CT 06/20/2019 and previous FINDINGS: Portal Vein 1.02 cm diameter. No evidence of occlusion or  thrombus. Velocities (all hepatopetal): Main:  33-38 cm/sec Right:  25 cm/sec Left:  25 cm/sec Hepatic Vein Velocities (all hepatofugal): Right:  72 cm/sec Middle:  76 cm/sec Left:  44 cm/sec Intrahepatic IVC patent, velocity: 71 cm/sec Hepatic Artery Velocity:  92 cm/sec Splenic Vein: No occlusion or thrombus.  Velocity: 31 cm/sec Spleen 8.5 x 5 x 8.6 cm (volume = 200 cm^3). Varices: None identified Ascites: Present Nodular liver contour. 1.2 cm right hepatic cyst. 1.1 cm echogenic lesion in the right lobe, possibly hemangioma but nonspecific; in retrospect, this was probably present on prior CT 11/23/2017. IMPRESSION: 1. Unremarkable hepatic vascular Doppler evaluation. 2. Abdominal ascites. 3. Nodular liver contour suggesting cirrhosis. Electronically Signed   By: [name] M.D.   On: 06/28/2019 16:48   US Abdomen Limited Ruq  Result Date: 06/29/2019 CLINICAL DATA:  History of cholelithiasis, choledocholithiasis and percutaneous cholecystostomy. Clinical concern for cholecystitis. EXAM: ULTRASOUND ABDOMEN LIMITED RIGHT UPPER QUADRANT COMPARISON:  Duplex liver ultrasound obtained yesterday. Abdomen and pelvis CT dated 06/20/2019. FINDINGS: Gallbladder: Contracted gallbladder containing a 2.2 cm densely shadowing stone. The stone is not visible on the CT other than questionable minimal calcific density in that region. Inferior to that level, there is dense barium in bowel loops on the CT. It is possible that the apparent stone could represent barium in 1 of the bowel loops extending into the region of the gallbladder fossa. The bowel or gallbladder wall is not well visualized, measuring approximately 4.1 mm in thickness. No pericholecystic fluid and no sonographic Murphy sign. Common bile duct: Diameter: 5.4 mm Liver: Mildly nodular contours are again demonstrated. 1.2 cm cyst in the right lobe and 1.0 cm cyst in the left lobe. Portal vein is patent on color Doppler imaging with normal direction of blood flow  towards the liver. Other: Small to moderate amount of ascites. Increased echogenicity of the right kidney. IMPRESSION: 1. Contracted gallbladder containing a 2.2 cm calculus versus bowel loop containing retained barium in the region of the gallbladder fossa. 2. No evidence of acute cholecystitis. 3. Mild nodular contours of the liver are again demonstrated, suggesting changes due to cirrhosis of the liver. 4. Small to moderate amount of ascites. 5. Echogenic right kidney, compatible with medical renal disease. Electronically Signed   By: Claudie Revering M.D.   On: 06/29/2019 07:47    Assessment / Plan:  21. 78 year old male  admitted with ascending cholangitis with choledocholithiasis: Status post ERCP, sphincterotomoy and stone removal 06/19/2019. He passed a black solid stool 10/30. Hg 6.7 >> 7.1>> 7.3 concerning for possible post sphincterotomy bleed.  -NPO except for meds for now until time of EGD verified  -EGD most likely tomorrow -Repeat H/H post transfusion -Continue to monitor patient for active GI bleeding  -Continue Pantoprazole 40mg  IV bid  2. Normocytic Anemia. Iron 54. Ferritin 495.  -see plan # 1  3.  Hyperbilirubinemia in setting of GI bleed. T bili drifting downward 4.4 >> 5.3.  -repeat hepatic panel with indirect and direct bili in am   4. Acute/chronic CHF  5. AKI  6. LV thrombus. Heparin dc'd  7. Abdominal distension. KUB today did  not show evidence of obstruction. Patient actively passed gas per the rectum during exam. No N/V.   8. Leukocytosis. WBC 13.0. He remains on afebrile. On Rocephin for cholangitis.   Further recommendations per Dr. Rush Landmark     Active Problems:   Hyperlipidemia   Diabetes mellitus without complication ([place])   CKD (chronic kidney disease), stage III (HCC)   AKI (acute kidney injury) ([place])   Acute on chronic systolic and diastolic heart failure, NYHA class 1 ([place])   Essential hypertension   Acute renal failure (ARF) (HCC)   Abdominal  pain   Acute lower UTI   Choledocholithiasis   Cholangitis due to bile duct calculus with obstruction   Cardiogenic shock ([place])   Dysphagia   Palliative care encounter     LOS: 11 days   Noralyn Pick  06/30/2019, 11:04 AM

## 2019-06-30 NOTE — Progress Notes (Signed)
[facility] Progress Note  CC:  Anemia, GI bleed s/p ERCP with sphincterotomy   Subjective:  Patient is eating solid food upon entering room. No nausea or vomiting. He describes having generalized abdominal gas discomfort. No significant pain. RN reports he passed a solid black stool yesterday evening. No further BM since then. No rectal bleeding or frank melena.   Objective:   Hg 6.7 today down from 7.3. INR 1.3. 1 unit of PRBCs ordered by hospitalist Heparin IV dc'd. Patient remains hemodynamically stable Foley catheter draining approximately 300cc yellow orange urine  KUB this am: Negative for free air. Bowel gas throughout the abdomen with a nonobstructive pattern. Residual oral contrast in the sigmoid colon demonstrates numerous colonic diverticula. Slight medialization of the bowel structures probably related to the known ascites. Evidence for urinary Foley catheter. Cardiac silhouette appears to be large for size.  Vital signs in last 24 hours: Temp:  [97.8 F (36.6 C)-98.8 F (37.1 C)] 98.4 F (36.9 C) (10/31 0940) Pulse Rate:  [51-104] 103 (10/31 0940) Resp:  [18-25] 22 (10/31 0940) BP: (101-114)/(58-67) 110/62 (10/31 0940) SpO2:  [96 %-100 %] 96 % (10/31 0940) Weight:  [79.2 kg] 79.2 kg (10/31 0528) Last BM Date: 06/29/19 General:   Alert, ill appearing male in NAD Eyes: Sclera mildly icteric, cataracts  Mouth: poor dentition, no ulcers or lesions Heart: Tachycardic, no murmur  Pulm: Breath sounds clear throughout, slightly diminished LLL. Abdomen: Protuberant, slightly tight,  tympanic to percussion, + BS x 4 quads, patient actively passed gas per the rectum during exam Rectum: Solid black stool, sent to lab for guaiac testing. Large firm prostate.  Extremities:  Without edema. Neurologic:  Alert and  oriented x4;  grossly normal neurologically. Psych:  Alert and cooperative. Normal mood and affect.  Intake/Output from previous day: 10/30  0701 - 10/31 0700 In: 360 [P.O.:160; I.V.:200] Out: 1700 [Urine:1700] Intake/Output this shift: Total I/O In: 380 [Blood:380] Out: -   Lab Results: Recent Labs    06/29/19 0454 06/29/19 0852 06/30/19 0428  WBC 13.9* 13.2* 13.0*  HGB 7.3* 7.1* 6.7*  HCT 22.8* 22.6* 21.3*  PLT 315 326 352   BMET Recent Labs    06/28/19 0410 06/29/19 0454 06/30/19 0428  NA 138 137 137  K 4.2 4.1 4.1  CL 95* 95* 97*  CO2 32 30 29  GLUCOSE 108* 108* 107*  BUN 88* 81* 70*  CREATININE 2.57* 2.42* 2.16*  CALCIUM 8.8* 8.4* 8.4*   LFT Recent Labs    06/29/19 0454 06/30/19 0428  PROT 6.4* 6.5  ALBUMIN 1.8* 1.7*  AST 30 37  ALT 23 27  ALKPHOS 57 54  BILITOT 5.3* 4.4*  BILIDIR 2.7*  --    PT/INR Recent Labs    06/29/19 0454 06/30/19 0428  LABPROT 16.0* 16.1*  INR 1.3* 1.3*   Hepatitis Panel Recent Labs    06/29/19 0454  HEPBSAG NON REACTIVE    Dg Abd 2 Views  Result Date: 06/30/2019 CLINICAL DATA:  Abdominal distension. EXAM: ABDOMEN - 2 VIEW COMPARISON:  CT of the abdomen pelvis 06/20/2019 and ultrasound 06/29/2019 FINDINGS: Negative for free air. Bowel gas throughout the abdomen with a nonobstructive pattern. Residual oral contrast in the sigmoid colon demonstrates numerous colonic diverticula. Slight medialization of the bowel structures probably related to the known ascites. Evidence for urinary Foley catheter. Cardiac silhouette appears to be large for size. IMPRESSION: Nonobstructive bowel gas pattern. Colonic diverticula. Electronically Signed   By: Scherrie Gerlach.D.  On: 06/30/2019 09:51   US Liver Doppler  Result Date: 06/28/2019 CLINICAL DATA:  Hyperbilirubinemia. History of choledocholithiasis. Abdominal pain. EXAM: DUPLEX ULTRASOUND OF LIVER TECHNIQUE: Color and duplex Doppler ultrasound was performed to evaluate the hepatic in-flow and out-flow vessels. COMPARISON:  CT 06/20/2019 and previous FINDINGS: Portal Vein 1.02 cm diameter. No evidence of occlusion or  thrombus. Velocities (all hepatopetal): Main:  33-38 cm/sec Right:  25 cm/sec Left:  25 cm/sec Hepatic Vein Velocities (all hepatofugal): Right:  72 cm/sec Middle:  76 cm/sec Left:  44 cm/sec Intrahepatic IVC patent, velocity: 71 cm/sec Hepatic Artery Velocity:  92 cm/sec Splenic Vein: No occlusion or thrombus.  Velocity: 31 cm/sec Spleen 8.5 x 5 x 8.6 cm (volume = 200 cm^3). Varices: None identified Ascites: Present Nodular liver contour. 1.2 cm right hepatic cyst. 1.1 cm echogenic lesion in the right lobe, possibly hemangioma but nonspecific; in retrospect, this was probably present on prior CT 11/23/2017. IMPRESSION: 1. Unremarkable hepatic vascular Doppler evaluation. 2. Abdominal ascites. 3. Nodular liver contour suggesting cirrhosis. Electronically Signed   By: Lucrezia Europe M.D.   On: 06/28/2019 16:48   US Abdomen Limited Ruq  Result Date: 06/29/2019 CLINICAL DATA:  History of cholelithiasis, choledocholithiasis and percutaneous cholecystostomy. Clinical concern for cholecystitis. EXAM: ULTRASOUND ABDOMEN LIMITED RIGHT UPPER QUADRANT COMPARISON:  Duplex liver ultrasound obtained yesterday. Abdomen and pelvis CT dated 06/20/2019. FINDINGS: Gallbladder: Contracted gallbladder containing a 2.2 cm densely shadowing stone. The stone is not visible on the CT other than questionable minimal calcific density in that region. Inferior to that level, there is dense barium in bowel loops on the CT. It is possible that the apparent stone could represent barium in 1 of the bowel loops extending into the region of the gallbladder fossa. The bowel or gallbladder wall is not well visualized, measuring approximately 4.1 mm in thickness. No pericholecystic fluid and no sonographic Murphy sign. Common bile duct: Diameter: 5.4 mm Liver: Mildly nodular contours are again demonstrated. 1.2 cm cyst in the right lobe and 1.0 cm cyst in the left lobe. Portal vein is patent on color Doppler imaging with normal direction of blood flow  towards the liver. Other: Small to moderate amount of ascites. Increased echogenicity of the right kidney. IMPRESSION: 1. Contracted gallbladder containing a 2.2 cm calculus versus bowel loop containing retained barium in the region of the gallbladder fossa. 2. No evidence of acute cholecystitis. 3. Mild nodular contours of the liver are again demonstrated, suggesting changes due to cirrhosis of the liver. 4. Small to moderate amount of ascites. 5. Echogenic right kidney, compatible with medical renal disease. Electronically Signed   By: Claudie Revering M.D.   On: 06/29/2019 07:47    Assessment / Plan:  89. 78 year old male  admitted with ascending cholangitis with choledocholithiasis: Status post ERCP, sphincterotomoy and stone removal 06/19/2019. He passed a black solid stool 10/30. Hg 6.7 >> 7.1>> 7.3 concerning for possible post sphincterotomy bleed.  -NPO except for meds for now until time of EGD verified  -EGD most likely tomorrow -Repeat H/H post transfusion -Continue to monitor patient for active GI bleeding  -Continue Pantoprazole 40mg  IV bid  2. Normocytic Anemia. Iron 54. Ferritin 495.  -see plan # 1  3.  Hyperbilirubinemia in setting of GI bleed. T bili drifting downward 4.4 >> 5.3.  -repeat hepatic panel with indirect and direct bili in am   4. Acute/chronic CHF  5. AKI  6. LV thrombus. Heparin dc'd  7. Abdominal distension. KUB today did  not show evidence of obstruction. Patient actively passed gas per the rectum during exam. No N/V.   8. Leukocytosis. WBC 13.0. He remains on afebrile. On Rocephin for cholangitis.   Further recommendations per Dr. Rush Landmark     Active Problems:   Hyperlipidemia   Diabetes mellitus without complication ([place])   CKD (chronic kidney disease), stage III ([facility])   AKI (acute kidney injury) ([place])   Acute on chronic systolic and diastolic heart failure, NYHA class 1 ([place])   Essential hypertension   Acute renal failure (ARF) ([facility])   Abdominal  pain   Acute lower UTI   Choledocholithiasis   Cholangitis due to bile duct calculus with obstruction   Cardiogenic shock ([place])   Dysphagia   Palliative care encounter     LOS: 11 days   Noralyn Pick  06/30/2019, 11:04 AM

## 2019-07-01 ENCOUNTER — Inpatient Hospital Stay (HOSPITAL_COMMUNITY): Payer: Medicare Other | Admitting: Certified Registered"

## 2019-07-01 ENCOUNTER — Encounter (HOSPITAL_COMMUNITY)
Admission: EM | Disposition: A | Discharge status: Home Health | Payer: Self-pay | Source: Home / Self Care | Attending: Family Medicine

## 2019-07-01 ENCOUNTER — Encounter (HOSPITAL_COMMUNITY): Payer: Self-pay | Admitting: *Deleted

## 2019-07-01 DIAGNOSIS — K254 Chronic or unspecified gastric ulcer with hemorrhage: Secondary | ICD-10-CM

## 2019-07-01 DIAGNOSIS — D62 Acute posthemorrhagic anemia: Secondary | ICD-10-CM

## 2019-07-01 DIAGNOSIS — R17 Unspecified jaundice: Secondary | ICD-10-CM

## 2019-07-01 DIAGNOSIS — K259 Gastric ulcer, unspecified as acute or chronic, without hemorrhage or perforation: Secondary | ICD-10-CM

## 2019-07-01 HISTORY — PX: ESOPHAGOGASTRODUODENOSCOPY (EGD) WITH PROPOFOL: SHX5813

## 2019-07-01 LAB — CBC
HCT: 22.7 % — ABNORMAL LOW (ref 39.0–52.0)
Hemoglobin: 7.3 g/dL — ABNORMAL LOW (ref 13.0–17.0)
MCH: 29.6 pg (ref 26.0–34.0)
MCHC: 32.2 g/dL (ref 30.0–36.0)
MCV: 91.9 fL (ref 80.0–100.0)
Platelets: 353 10*3/uL (ref 150–400)
RBC: 2.47 MIL/uL — ABNORMAL LOW (ref 4.22–5.81)
RDW: 19.8 % — ABNORMAL HIGH (ref 11.5–15.5)
WBC: 11.9 10*3/uL — ABNORMAL HIGH (ref 4.0–10.5)
nRBC: 0 % (ref 0.0–0.2)

## 2019-07-01 LAB — COOXEMETRY PANEL
Carboxyhemoglobin: 3.3 % — ABNORMAL HIGH (ref 0.5–1.5)
Methemoglobin: 1 % (ref 0.0–1.5)
O2 Saturation: 75.7 %
Total hemoglobin: 7.7 g/dL — ABNORMAL LOW (ref 12.0–16.0)

## 2019-07-01 LAB — COMPREHENSIVE METABOLIC PANEL
ALT: 28 U/L (ref 0–44)
AST: 39 U/L (ref 15–41)
Albumin: 1.8 g/dL — ABNORMAL LOW (ref 3.5–5.0)
Alkaline Phosphatase: 58 U/L (ref 38–126)
Anion gap: 11 (ref 5–15)
BUN: 59 mg/dL — ABNORMAL HIGH (ref 8–23)
CO2: 29 mmol/L (ref 22–32)
Calcium: 8.4 mg/dL — ABNORMAL LOW (ref 8.9–10.3)
Chloride: 96 mmol/L — ABNORMAL LOW (ref 98–111)
Creatinine, Ser: 2.15 mg/dL — ABNORMAL HIGH (ref 0.61–1.24)
GFR calc Af Amer: 34 mL/min — ABNORMAL LOW (ref >60)
GFR calc non Af Amer: 29 mL/min — ABNORMAL LOW (ref >60)
Glucose, Bld: 103 mg/dL — ABNORMAL HIGH (ref 70–99)
Potassium: 4.2 mmol/L (ref 3.5–5.1)
Sodium: 136 mmol/L (ref 135–145)
Total Bilirubin: 4.1 mg/dL — ABNORMAL HIGH (ref 0.3–1.2)
Total Protein: 6.7 g/dL (ref 6.5–8.1)

## 2019-07-01 LAB — TYPE AND SCREEN
ABO/RH(D): A POS
Antibody Screen: NEGATIVE
Unit division: 0

## 2019-07-01 LAB — BPAM RBC
Blood Product Expiration Date: 202011282359
ISSUE DATE / TIME: 202010310914
Unit Type and Rh: 6200

## 2019-07-01 LAB — GLUCOSE, CAPILLARY
Glucose-Capillary: 100 mg/dL — ABNORMAL HIGH (ref 70–99)
Glucose-Capillary: 104 mg/dL — ABNORMAL HIGH (ref 70–99)
Glucose-Capillary: 106 mg/dL — ABNORMAL HIGH (ref 70–99)
Glucose-Capillary: 98 mg/dL (ref 70–99)

## 2019-07-01 LAB — PROTIME-INR
INR: 1.2 (ref 0.8–1.2)
Prothrombin Time: 15.3 seconds — ABNORMAL HIGH (ref 11.4–15.2)

## 2019-07-01 LAB — BILIRUBIN, DIRECT: Bilirubin, Direct: 2.1 mg/dL — ABNORMAL HIGH (ref 0.0–0.2)

## 2019-07-01 SURGERY — ESOPHAGOGASTRODUODENOSCOPY (EGD) WITH PROPOFOL
Anesthesia: Monitor Anesthesia Care

## 2019-07-01 SURGERY — ERCP, WITH INTERVENTION IF INDICATED
Anesthesia: General

## 2019-07-01 MED ORDER — PROPOFOL 500 MG/50ML IV EMUL
INTRAVENOUS | Status: DC | PRN
  Administered 2019-07-01: 50 ug/kg/min via INTRAVENOUS

## 2019-07-01 MED ORDER — HEPARIN (PORCINE) 25000 UT/250ML-% IV SOLN
1250.0000 [IU]/h | INTRAVENOUS | Status: DC
  Administered 2019-07-01 – 2019-07-02 (×2): 1550 [IU]/h via INTRAVENOUS
  Administered 2019-07-03: 1300 [IU]/h via INTRAVENOUS
  Administered 2019-07-04: 1100 [IU]/h via INTRAVENOUS
  Administered 2019-07-05: 1150 [IU]/h via INTRAVENOUS
  Administered 2019-07-06: 1300 [IU]/h via INTRAVENOUS
  Filled 2019-07-01 (×6): qty 250

## 2019-07-01 MED ORDER — SODIUM CHLORIDE 0.9 % IV SOLN: INTRAVENOUS | Status: DC

## 2019-07-01 MED ORDER — HEPARIN (PORCINE) 25000 UT/250ML-% IV SOLN: 1550.0000 [IU]/h | INTRAVENOUS | Status: DC

## 2019-07-01 MED ORDER — WARFARIN SODIUM 5 MG PO TABS
5.0000 mg | ORAL_TABLET | Freq: Once | ORAL | Status: AC
  Administered 2019-07-01: 5 mg via ORAL
  Filled 2019-07-01: qty 1

## 2019-07-01 MED ORDER — SODIUM CHLORIDE 0.9 % IV SOLN
510.0000 mg | Freq: Once | INTRAVENOUS | Status: AC
  Administered 2019-07-02: 510 mg via INTRAVENOUS
  Filled 2019-07-01 (×2): qty 17

## 2019-07-01 MED ORDER — LACTATED RINGERS IV SOLN
INTRAVENOUS | Status: DC
  Administered 2019-07-01: 11:00:00 via INTRAVENOUS

## 2019-07-01 SURGICAL SUPPLY — 15 items

## 2019-07-01 NOTE — Progress Notes (Signed)
Patient ID: [name], male   DOB: 24-Nov-1940, 78 y.o.   MRN: [id number]    Advanced Heart Failure Rounding Note   Subjective:    Dobutamine discontinued. Has biventricular HF. Total bili  5.6>>6.0>>6.7>>5.3>>4.4>>4.1.  Creatinine continues to trend down, 2.15 today.  CVP 6-7 today. Co-ox 76%.   Also treated for acending cholangitis this admit. S/p ERCP and stone removal. WBCs 11.9 today, afebrile. He had repeat RUQ [place], no evidence for acute cholecystitis.  He is off abx.  Still with abdominal discomfort, attributes to gas.   Dark stool yesterday, got 1 unit PRBCs on 10/31.  INR 1.2 today, he has been off heparin gtt but on warfarin.  He will be getting EGD +/- ERCP +/- flex sig today.    Objective:   Weight Range:  Vital Signs:   Temp:  [97.9 F (36.6 C)-98.8 F (37.1 C)] 97.9 F (36.6 C) (11/01 0738) Pulse Rate:  [104-110] 110 (11/01 0738) Resp:  [16-28] 27 (11/01 0738) BP: (93-107)/(53-68) 107/59 (11/01 0738) SpO2:  [96 %-100 %] 98 % (11/01 0738) Weight:  [73.4 kg] 73.4 kg (11/01 0400) Last BM Date: 06/29/19  Weight change: Filed Weights   06/29/19 0417 06/30/19 0528 07/01/19 0400  Weight: 80.1 kg 79.2 kg 73.4 kg    Intake/Output:   Intake/Output Summary (Last 24 hours) at 07/01/2019 1001 Last data filed at 07/01/2019 0938 Gross per 24 hour  Intake 685.96 ml  Output 2500 ml  Net -1814.04 ml   General: NAD Neck: No JVD, no thyromegaly or thyroid nodule.  Lungs: Clear to auscultation bilaterally with normal respiratory effort. CV: Nondisplaced PMI.  Heart regular S1/S2, no S3/S4, no murmur.  No peripheral edema.   Abdomen: Soft, nontender, no hepatosplenomegaly, no distention.  Skin: Intact without lesions or rashes.  Neurologic: Alert and oriented x 3.  Psych: Normal affect. Extremities: No clubbing or cyanosis.  HEENT: Normal.   Telemetry: Sinus tachy 100s, personally reviewed.    Labs: Basic Metabolic Panel: Recent Labs  Lab 06/27/19 0402 06/28/19  0410 06/28/19 0920 06/29/19 0454 06/30/19 0428 07/01/19 0322  NA 139 138  --  137 137 136  K 3.9 4.2  --  4.1 4.1 4.2  CL 96* 95*  --  95* 97* 96*  CO2 33* 32  --  30 29 29   GLUCOSE 124* 108*  --  108* 107* 103*  BUN 91* 88*  --  81* 70* 59*  CREATININE 2.78* 2.57*  --  2.42* 2.16* 2.15*  CALCIUM 8.6* 8.8*  --  8.4* 8.4* 8.4*  MG  --   --  1.9  --   --   --     Liver Function Tests: Recent Labs  Lab 06/27/19 0402 06/28/19 0410 06/29/19 0454 06/30/19 0428 07/01/19 0322  AST 25 29 30  37 39  ALT 20 22 23 27 28   ALKPHOS 51 63 57 54 58  BILITOT 6.0* 6.7* 5.3* 4.4* 4.1*  PROT 6.2* 7.0 6.4* 6.5 6.7  ALBUMIN 1.7* 2.0* 1.8* 1.7* 1.8*   No results for input(s): LIPASE, AMYLASE in the last 168 hours. No results for input(s): AMMONIA in the last 168 hours.  CBC: Recent Labs  Lab 06/28/19 0410 06/29/19 0454 06/29/19 0852 06/30/19 0428 07/01/19 0322  WBC 13.8* 13.9* 13.2* 13.0* 11.9*  HGB 9.0* 7.3* 7.1* 6.7* 7.3*  HCT 28.1* 22.8* 22.6* 21.3* 22.7*  MCV 89.2 91.9 92.6 93.8 91.9  PLT 326 315 326 352 353    Cardiac Enzymes: No results for input(s):  CKTOTAL, CKMB, CKMBINDEX, TROPONINI in the last 168 hours.  BNP: BNP (last 3 results) Recent Labs    08/16/18 0940 02/04/19 0906 06/18/19 2109  BNP 1,774.3* >4,500.0* 2,536.0*    ProBNP (last 3 results) No results for input(s): PROBNP in the last 8760 hours.    Other results:  Imaging: Dg Abd 2 Views  Result Date: 06/30/2019 CLINICAL DATA:  Abdominal distension. EXAM: ABDOMEN - 2 VIEW COMPARISON:  CT of the abdomen pelvis 06/20/2019 and ultrasound 06/29/2019 FINDINGS: Negative for free air. Bowel gas throughout the abdomen with a nonobstructive pattern. Residual oral contrast in the sigmoid colon demonstrates numerous colonic diverticula. Slight medialization of the bowel structures probably related to the known ascites. Evidence for urinary Foley catheter. Cardiac silhouette appears to be large for size.  IMPRESSION: Nonobstructive bowel gas pattern. Colonic diverticula. Electronically Signed   By: Markus Daft M.D.   On: 06/30/2019 09:51     Medications:     Scheduled Medications: . sodium chloride   Intravenous Once  . Chlorhexidine Gluconate Cloth  6 each Topical Daily  . feeding supplement (ENSURE ENLIVE)  237 mL Oral BID BM  . finasteride  5 mg Oral Daily  . insulin aspart  0-9 Units Subcutaneous Q4H  . isosorbide-hydrALAZINE  0.5 tablet Oral TID  . multivitamin with minerals  1 tablet Oral Daily  . pantoprazole (PROTONIX) IV  40 mg Intravenous Q12H  . polycarbophil  625 mg Oral Daily  . saccharomyces boulardii  250 mg Oral BID  . sevelamer carbonate  800 mg Oral TID WC  . simethicone  160 mg Oral TID AC & HS  . sodium chloride flush  10-40 mL Intracatheter Q12H  . sodium chloride flush  3 mL Intravenous Once  . tamsulosin  0.4 mg Oral Daily  . torsemide  40 mg Oral Daily  . Warfarin - Pharmacist Dosing Inpatient   Does not apply q1800    Infusions: . sodium chloride 20 mL/hr at 06/30/19 2002    PRN Medications: alum & mag hydroxide-simeth, ondansetron (ZOFRAN) IV, sodium chloride flush   Assessment/Plan:    1. 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 Diureses and renal function improved w/ inotropic therapy.  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 Previously declined cath. He does, of note, have anterior Qs on ECG.  Not a LHC candidate due to baseline renal function. Echo this admission showed EF < 20%, severe LV dilation, small LV apical thrombus, severely dilated and dysfunctional RV with flattened septum. Dobutamine started on 10/23 and discontinued 10/28. Co-ox 76%, CVP 6-7.  Creatinine down to 2.15.  - Continue torsemide 40 mg daily.  - No ARB/ARNI or digoxin due to renal function.  - Continue low dose Bidil 1/2 tab tid.     - Continue TED hose.   2. AKI: On CKD stage 3.  Initial creatinine 2.8, peaked 5.16. Trending down 4.85>>4.19>>3.39> 2.78>>2.57>>2.42>>2.16>>2.15. BUN trending down.  Suspect cardiorenal syndrome likely with ATN.  Baseline creatinine was 1.5 in 1/20 and 2.5 in 6/20. Dialysis would be very difficult for him with severe biventricular failure.  3. LV thrombus: warfarin started 10/27, INR 1.2.  - He is off heparin gtt currently with anemia, ?GI bleed. Will await results of endoscopy today and GI recs to decide on restarting heparin gtt.  - Continue warfarin unless GI recommends otherwise.   4. Ascending cholangitis: S/p ERCP and stone removal. Abdominal US  10/30 did not show acute cholecystitis. He is off antibiotics.  5. Hyperbilirubinemia: Likely due to combination of CBD obstruction (now relieved) and RV failure with hepatic congestion/cirrhosis. Total bilirubin lower today.  6. Hypokalemia: Resolved.  7. Anemia: Dark stool yesterday, 1 unit PRBCs 10/31 with hgb down to 6.7.  Hgb 7.3 today.   - Plan for EGD +/- ERCP +/- flex sig today.   8. NSVT: in the setting of severe LV dysfunction. Keep K and Mg in normal range.   Loralie Champagne 07/01/2019 10:01 AM

## 2019-07-01 NOTE — Anesthesia Procedure Notes (Signed)
Procedure Name: MAC Date/Time: 07/01/2019 11:31 AM Performed by: Babs Bertin, CRNA Pre-anesthesia Checklist: Patient identified, Emergency Drugs available, Suction available, Patient being monitored and Timeout performed Patient Re-evaluated:Patient Re-evaluated prior to induction Oxygen Delivery Method: Simple face mask

## 2019-07-01 NOTE — Progress Notes (Signed)
PROGRESS NOTE    Leibish Mcgregor   NOI:[id number]  DOB: May 13, 1941  DOA: 06/18/2019 PCP: [name], MD   Brief Narrative:  [name] is a 78 y.o. 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 Percutaneous drain was pulled on 02/02/2018. H presents with c/o abdominal pain RUQ for the past 4-5 days. Diagnosed with choledocholithiasis and cholangitis.  10/20 underwent ERCP and stone removal and was also treated with Zosyn and Augmentin. Developed acute bi-V CHF and AKI on CKD 3 and treated with Dobutamine by CHF team. Nephrology also consulted for the same.   - 10/24 palliative care consulted 10/31 Hb dropped to 6.9 while on a Heparin infusion and was noted to have a bloody BM.   Subjective: No complaints today.     Assessment & Plan:   Principal Problem: Acute blood loss anemia- melena on 10/31 - EGD today reveals LA grade C esophagitis, gastric ulcers, patent sphincterotomy- no bleeding at the time - per GI> H pylori serology, resume Heparin in 10 hrs without bolus, cont Protonix BID IV x 48 hrs and then oral BID for 2-3 mo, resume solid food, EGD in 3 months - will receive Feraheme 510 mg IV today  Acute bi- V CHF with cardiogenic shock - new drop in EF to < 20%- cont management per heart failure team - not a cath candidate due to renal function - now on Demadex orally - also receivign Bidil  LV thrombus - small LV thrombus - on warfarin with Heparin bridge - INR 1.2 - per GI, can resume Heparin in 10 hrs w/o bolus-   NSVT - keep K and Mg normal  AKI on CKD 3 - suspected to be related to cardiorenal syndrome - Cr steadily improved from a peak of 5.16 to 2.15 today    Cholangitis due to bile duct calculus with obstruction with concomitant ileus Hyperbilirubinemia - Cholangitis has been  treated as mentioned above - Hyperbili likely due to cholangitis trended down from ~ 15 when admitted to 4.1 - ileus resolved    BPH with chronic indwelling foley  10/23 chronic foley removed and urology subsequently consulted to place a new one- 18 f coude placed- recommended f/u in 1-2 wks with [facility]  - cont Flomax and Finasteride   DM/? -not on medication at home- last A1c in computer from 2016 was 5.6 - sugars have been essentially normal - d/c CBGs   Time spent in minutes: 40 min in reviewed prior notes (inpatient and outpatient) - discussed plan with GI DVT prophylaxis: Heparin infusion Code Status: DNR Family Communication:  Disposition Plan: to be determined Consultants:    GI  Cardiology  Nephrology  General surgery  CHF team  Urology  Palliative care Procedures:   EGD Impression:               - No gross lesions in esophagus proximally. LA                            Grade C esophagitis distally.                           - 3 cm hiatal hernia.                           -  Erythematous mucosa in the gastric body and                            antrum.                           - Non-bleeding gastric ulcers with a clean ulcer                            base (Forrest Class III).                           - No gross lesions in the duodenal bulb, in the                            first portion of the duodenum and in the second                            portion of the duodenum.                           - Patent sphincterotomy was found without                            ulceration noted. Recommendation:           - The patient will be observed post-procedure,                            until all discharge criteria are met.                           - Return patient to hospital ward for ongoing care.                           - IV PPI BID x 48 more hours. Then transition to PO                            PPI BID thereafter. He should continue this for  the                            next 2-3 months.                           - IV Iron tomorrow would be reasonable to optimize                            Iron stores.                           - For him, may consider a Hgb goal >8 due to his                            significant heart history.                           -  Send H. pylori blood antigen to evaluate for                            exposure history and if positive he would benefit                            from Quadruple therapy.                           - Repeat upper endoscopy in 3 months to check                            healing could be considered based on his overall                            clinical health. He will be scheduled a follow up                            in GI clinic with Dr. Ardis Hughs.                           - If anticoagulation is necessary, would consider                            restarting heparin gtt in 10-hours without bolus                            and titrate upwards as necessary. Coumading                            reasonable to restart tomorrow or next day since it                            will take time to bring him back up if it is                            decided to use Coumadin. If NOAC, would wait for at                            least 48 more hours. Antimicrobials:  Anti-infectives (From admission, onward)   Start     Dose/Rate Route Frequency Ordered Stop   06/28/19 1630  cefTRIAXone (ROCEPHIN) 1 g in sodium chloride 0.9 % 100 mL IVPB  Status:  Discontinued     1 g 200 mL/hr over 30 Minutes Intravenous Every 24 hours 06/28/19 1620 06/30/19 0902   06/22/19 1000  amoxicillin-clavulanate (AUGMENTIN) 500-125 MG per tablet 500 mg     1 tablet Oral Every 12 hours 06/22/19 0815 06/26/19 2235   06/20/19 1400  piperacillin-tazobactam (ZOSYN) IVPB 2.25 g  Status:  Discontinued     2.25 g 100 mL/hr over 30 Minutes Intravenous Every 8 hours 06/20/19 1209 06/22/19 0815   06/19/19 0600   piperacillin-tazobactam (ZOSYN) IVPB 3.375 g  Status:  Discontinued     3.375 g 12.5 mL/hr over 240 Minutes Intravenous Every  8 hours 06/19/19 0150 06/20/19 1209   06/18/19 2330  piperacillin-tazobactam (ZOSYN) IVPB 3.375 g     3.375 g 100 mL/hr over 30 Minutes Intravenous  Once 06/18/19 2322 06/19/19 0058       Objective: Vitals:   07/01/19 0559 07/01/19 0738 07/01/19 1054 07/01/19 1209  BP:  (!) 107/59 122/66 (!) 80/51  Pulse:  (!) 110 (!) 110 96  Resp: 16 (!) 27 (!) 21 (!) 21  Temp:  97.9 F (36.6 C) 97.6 F (36.4 C) (!) 97.3 F (36.3 C)  TempSrc:  Oral Temporal Temporal  SpO2:  98% 100% 100%  Weight:   73.4 kg   Height:   5' 11"  (1.803 m)     Intake/Output Summary (Last 24 hours) at 07/01/2019 1216 Last data filed at 07/01/2019 1208 Gross per 24 hour  Intake 785.96 ml  Output 1850 ml  Net -1064.04 ml   Filed Weights   06/30/19 0528 07/01/19 0400 07/01/19 1054  Weight: 79.2 kg 73.4 kg 73.4 kg    Examination: General exam: Appears comfortable  HEENT: PERRLA, oral mucosa moist, no sclera icterus or thrush Respiratory system: Clear to auscultation. Respiratory effort normal. Cardiovascular system: S1 & S2 heard, RRR.   Gastrointestinal system: Abdomen soft, non-tender, moderately distended. Normal bowel sounds. Central nervous system: Alert and oriented. No focal neurological deficits. Extremities: No cyanosis, clubbing or edema- PICC right arm Skin: No rashes or ulcers Psychiatry:  Mood & affect appropriate.    Data Reviewed: I have personally reviewed following labs and imaging studies  CBC: Recent Labs  Lab 06/28/19 0410 06/29/19 0454 06/29/19 0852 06/30/19 0428 07/01/19 0322  WBC 13.8* 13.9* 13.2* 13.0* 11.9*  HGB 9.0* 7.3* 7.1* 6.7* 7.3*  HCT 28.1* 22.8* 22.6* 21.3* 22.7*  MCV 89.2 91.9 92.6 93.8 91.9  PLT 326 315 326 352 568   Basic Metabolic Panel: Recent Labs  Lab 06/27/19 0402 06/28/19 0410 06/28/19 0920 06/29/19 0454 06/30/19 0428  07/01/19 0322  NA 139 138  --  137 137 136  K 3.9 4.2  --  4.1 4.1 4.2  CL 96* 95*  --  95* 97* 96*  CO2 33* 32  --  30 29 29   GLUCOSE 124* 108*  --  108* 107* 103*  BUN 91* 88*  --  81* 70* 59*  CREATININE 2.78* 2.57*  --  2.42* 2.16* 2.15*  CALCIUM 8.6* 8.8*  --  8.4* 8.4* 8.4*  MG  --   --  1.9  --   --   --    GFR: Estimated Creatinine Clearance: 30.8 mL/min (A) (by C-G formula based on SCr of 2.15 mg/dL (H)). Liver Function Tests: Recent Labs  Lab 06/27/19 0402 06/28/19 0410 06/29/19 0454 06/30/19 0428 07/01/19 0322  AST 25 29 30  37 39  ALT 20 22 23 27 28   ALKPHOS 51 63 57 54 58  BILITOT 6.0* 6.7* 5.3* 4.4* 4.1*  PROT 6.2* 7.0 6.4* 6.5 6.7  ALBUMIN 1.7* 2.0* 1.8* 1.7* 1.8*   No results for input(s): LIPASE, AMYLASE in the last 168 hours. No results for input(s): AMMONIA in the last 168 hours. Coagulation Profile: Recent Labs  Lab 06/27/19 0402 06/28/19 0410 06/29/19 0454 06/30/19 0428 07/01/19 0322  INR 1.6* 1.3* 1.3* 1.3* 1.2   Cardiac Enzymes: No results for input(s): CKTOTAL, CKMB, CKMBINDEX, TROPONINI in the last 168 hours. BNP (last 3 results) No results for input(s): PROBNP in the last 8760 hours. HbA1C: No results for input(s): HGBA1C in the last 72  hours. CBG: Recent Labs  Lab 06/30/19 1558 06/30/19 2002 07/01/19 0015 07/01/19 0339 07/01/19 0744  GLUCAP 195* 130* 98 100* 106*   Lipid Profile: No results for input(s): CHOL, HDL, LDLCALC, TRIG, CHOLHDL, LDLDIRECT in the last 72 hours. Thyroid Function Tests: No results for input(s): TSH, T4TOTAL, FREET4, T3FREE, THYROIDAB in the last 72 hours. Anemia Panel: Recent Labs    06/29/19 0423  FERRITIN 495*  TIBC 225*  IRON 54   Urine analysis:    Component Value Date/Time   COLORURINE AMBER (A) 06/21/2019 0916   APPEARANCEUR CLOUDY (A) 06/21/2019 0916   LABSPEC 1.020 06/21/2019 0916   PHURINE 5.0 06/21/2019 0916   GLUCOSEU NEGATIVE 06/21/2019 0916   HGBUR MODERATE (A) 06/21/2019 0916    BILIRUBINUR SMALL (A) 06/21/2019 0916   KETONESUR NEGATIVE 06/21/2019 0916   PROTEINUR 30 (A) 06/21/2019 0916   NITRITE NEGATIVE 06/21/2019 0916   LEUKOCYTESUR MODERATE (A) 06/21/2019 0916   Sepsis Labs: @LABRCNTIP (procalcitonin:4,lacticidven:4) ) Recent Results (from the past 240 hour(s))  Culture, Urine     Status: None   Collection Time: 06/22/19  8:16 AM   Specimen: Urine, Catheterized  Result Value Ref Range Status   Specimen Description URINE, CATHETERIZED  Final   Special Requests NONE  Final   Culture   Final    NO GROWTH Performed at [facility], [place] [address], [place], [place] [address]    Report Status 06/23/2019 FINAL  Final         Radiology Studies: Dg Abd 2 Views  Result Date: 06/30/2019 CLINICAL DATA:  Abdominal distension. EXAM: ABDOMEN - 2 VIEW COMPARISON:  CT of the abdomen pelvis 06/20/2019 and ultrasound 06/29/2019 FINDINGS: Negative for free air. Bowel gas throughout the abdomen with a nonobstructive pattern. Residual oral contrast in the sigmoid colon demonstrates numerous colonic diverticula. Slight medialization of the bowel structures probably related to the known ascites. Evidence for urinary Foley catheter. Cardiac silhouette appears to be large for size. IMPRESSION: Nonobstructive bowel gas pattern. Colonic diverticula. Electronically Signed   By: Markus Daft M.D.   On: 06/30/2019 09:51      Scheduled Meds: . [MAR Hold] sodium chloride   Intravenous Once  . [MAR Hold] Chlorhexidine Gluconate Cloth  6 each Topical Daily  . [MAR Hold] feeding supplement (ENSURE ENLIVE)  237 mL Oral BID BM  . [MAR Hold] finasteride  5 mg Oral Daily  . [MAR Hold] insulin aspart  0-9 Units Subcutaneous Q4H  . [MAR Hold] isosorbide-hydrALAZINE  0.5 tablet Oral TID  . [MAR Hold] multivitamin with minerals  1 tablet Oral Daily  . [MAR Hold] pantoprazole (PROTONIX) IV  40 mg Intravenous Q12H  . [MAR Hold] polycarbophil  625 mg Oral Daily  . [MAR Hold]  saccharomyces boulardii  250 mg Oral BID  . [MAR Hold] sevelamer carbonate  800 mg Oral TID WC  . [MAR Hold] simethicone  160 mg Oral TID AC & HS  . [MAR Hold] sodium chloride flush  10-40 mL Intracatheter Q12H  . [MAR Hold] sodium chloride flush  3 mL Intravenous Once  . [MAR Hold] tamsulosin  0.4 mg Oral Daily  . [MAR Hold] torsemide  40 mg Oral Daily  . [MAR Hold] Warfarin - Pharmacist Dosing Inpatient   Does not apply q1800   Continuous Infusions: . sodium chloride 20 mL/hr at 06/30/19 2002  . sodium chloride    . lactated ringers 10 mL/hr at 07/01/19 1057     LOS: 12 days  Debbe Odea, MD [facility] Pager: [email or web address] Password [email or web address] 07/01/2019, 12:16 PM

## 2019-07-01 NOTE — Anesthesia Postprocedure Evaluation (Signed)
Anesthesia Post Note  Patient: Ralston Tilly  Procedure(s) Performed: ESOPHAGOGASTRODUODENOSCOPY (EGD) WITH PROPOFOL (N/A ) ENDOSCOPIC RETROGRADE CHOLANGIOPANCREATOGRAPHY (ERCP) (N/A )     Patient location during evaluation: PACU Anesthesia Type: MAC Level of consciousness: awake and alert Pain management: pain level controlled Vital Signs Assessment: post-procedure vital signs reviewed and stable Respiratory status: spontaneous breathing, nonlabored ventilation, respiratory function stable and patient connected to nasal cannula oxygen Cardiovascular status: stable and blood pressure returned to baseline Postop Assessment: no apparent nausea or vomiting Anesthetic complications: no    Last Vitals:  Vitals:   07/01/19 1224 07/01/19 1252  BP: (!) 105/59 115/67  Pulse: 95 99  Resp: (!) 24 (!) 24  Temp:    SpO2: 100%     Last Pain:  Vitals:   07/01/19 1252  TempSrc:   PainSc: 0-No pain                 OSSEY,KEVIN DAVID

## 2019-07-01 NOTE — Transfer of Care (Signed)
Immediate Anesthesia Transfer of Care Note  Patient: [name]  Procedure(s) Performed: ESOPHAGOGASTRODUODENOSCOPY (EGD) WITH PROPOFOL (N/A ) ENDOSCOPIC RETROGRADE CHOLANGIOPANCREATOGRAPHY (ERCP) (N/A )  Patient Location: PACU  Anesthesia Type:MAC  Level of Consciousness: awake, alert  and oriented  Airway & Oxygen Therapy: Patient Spontanous Breathing and Patient connected to face mask oxygen  Post-op Assessment: Report given to RN and Post -op Vital signs reviewed and stable  Post vital signs: Reviewed and stable  Last Vitals:  Vitals Value Taken Time  BP 65/50 07/01/19 1207  Temp    Pulse 96 07/01/19 1208  Resp 21 07/01/19 1208  SpO2 100 % 07/01/19 1208  Vitals shown include unvalidated device data.  Last Pain:  Vitals:   07/01/19 1054  TempSrc: Temporal  PainSc: 0-No pain         Complications: No apparent anesthesia complications

## 2019-07-01 NOTE — Interval H&P Note (Signed)
History and Physical Interval Note:  07/01/2019 11:27 AM  [name]  has presented today for surgery, with the diagnosis of Anemia, black solid stool.  The various methods of treatment have been discussed with the patient and family. After consideration of risks, benefits and other options for treatment, the patient has consented to  Procedure(s): ESOPHAGOGASTRODUODENOSCOPY (EGD) WITH PROPOFOL (N/A) ENDOSCOPIC RETROGRADE CHOLANGIOPANCREATOGRAPHY (ERCP) (N/A) and potential Flexible Sigmiodoscopy as a surgical intervention.  The patient's history has been reviewed, patient examined, no change in status, stable for surgery.  I have reviewed the patient's chart and labs.  Questions were answered to the patient's satisfaction.    The risks of an ERCP were discussed at length, including but not limited to the risk of perforation, bleeding, abdominal pain, post-ERCP pancreatitis (while usually mild can be severe and even life threatening).   [employer]

## 2019-07-01 NOTE — Op Note (Addendum)
[facility] Patient Name: [name] Procedure Date : 07/01/2019 MRN: [id number] Attending MD: Justice Britain , MD Date of Birth: 10/13/1940 CSN: [id number] Age: 78 Admit Type: Inpatient Procedure:                Upper GI endoscopy Indications:              Iron deficiency anemia, Melena, Occult blood in                            stool Providers:                Justice Britain, MD, Elmer Ramp. Tilden Dome, RN, Lazaro Arms, Technician Referring MD:             Milus Banister, MD, [facility] Medicines:                Monitored Anesthesia Care Complications:            No immediate complications. Estimated Blood Loss:     Estimated blood loss: none. Procedure:                Pre-Anesthesia Assessment:                           - Prior to the procedure, a History and Physical                            was performed, and patient medications and                            allergies were reviewed. The patient's tolerance of                            previous anesthesia was also reviewed. The risks                            and benefits of the procedure and the sedation                            options and risks were discussed with the patient.                            All questions were answered, and informed consent                            was obtained. Prior Anticoagulants: The patient                            last took Coumadin (warfarin) 1 day and heparin 1                            day prior to the procedure. ASA Grade Assessment:  IV - A patient with severe systemic disease that is                            a constant threat to life. After reviewing the                            risks and benefits, the patient was deemed in                            satisfactory condition to undergo the procedure.                           After obtaining informed consent, the endoscope was                passed under direct vision. Throughout the                            procedure, the patient's blood pressure, pulse, and                            oxygen saturations were monitored continuously. The                            [id number]-H190 ([id number]) Olympus gastroscope was                            introduced through the mouth, and advanced to the                            third part of duodenum. The [id number]-[id number] ([id number])                            Olympus duodenoscope was introduced through the                            mouth, and advanced to the second part of duodenum.                            The upper GI endoscopy was accomplished without                            difficulty. The patient tolerated the procedure. Scope In: Scope Out: Findings:      No gross lesions were noted in the proximal esophagus and in the mid       esophagus.      LA Grade C (one or more mucosal breaks continuous between tops of 2 or       more mucosal folds, less than 75% circumference) esophagitis with no       bleeding was found in the distal esophagus.      A 3 cm hiatal hernia was present.      Patchy mildly erythematous mucosa without bleeding was found in the       gastric body and in the gastric antrum.      Few non-bleeding superficial gastric ulcers and  some erosions with clean       bases (Forrest Class III) were found in the gastric antrum and in the       prepyloric region of the stomach. The largest lesion was 9 mm in largest       dimension.      No gross lesions were noted in the duodenal bulb, in the first portion       of the duodenum and in the second portion of the duodenum.      There was evidence of a patent sphincterotomy in the ampulla without       ulceration or evidence of recent bleeding/oozing. Impression:               - No gross lesions in esophagus proximally. LA                            Grade C esophagitis distally.                           - 3 cm hiatal  hernia.                           - Erythematous mucosa in the gastric body and                            antrum.                           - Non-bleeding gastric ulcers with a clean ulcer                            base (Forrest Class III).                           - No gross lesions in the duodenal bulb, in the                            first portion of the duodenum and in the second                            portion of the duodenum.                           - Patent sphincterotomy was found without                            ulceration noted. Recommendation:           - The patient will be observed post-procedure,                            until all discharge criteria are met.                           - Return patient to hospital ward for ongoing care.                           -  IV PPI BID x 48 more hours. Then transition to PO                            PPI BID thereafter. He should continue this for the                            next 2-3 months.                           - IV Iron tomorrow would be reasonable to optimize                            Iron stores.                           - For him, may consider a Hgb goal >8 due to his                            significant heart history.                           - Send H. pylori blood antigen to evaluate for                            exposure history and if positive he would benefit                            from Quadruple therapy.                           - Repeat upper endoscopy in 3 months to check                            healing could be considered based on his overall                            clinical health. He will be scheduled a follow up                            in GI clinic with Dr. Ardis Hughs.                           - If anticoagulation is necessary, would consider                            restarting heparin gtt in 10-hours without bolus                            and titrate upwards as necessary.  Coumading                            reasonable to restart tomorrow or next day since it  will take time to bring him back up if it is                            decided to use Coumadin. If NOAC, would wait for at                            least 48 more hours.                           - The findings and recommendations were discussed                            with the patient.                           - The findings and recommendations were discussed                            with the referring physician. Procedure Code(s):        --- Professional ---                           [phone], Esophagogastroduodenoscopy, flexible,                            transoral; diagnostic, including collection of                            specimen(s) by brushing or washing, when performed                            (separate procedure) Diagnosis Code(s):        --- Professional ---                           K20.9, Esophagitis, unspecified                           K44.9, Diaphragmatic hernia without obstruction or                            gangrene                           K31.89, Other diseases of stomach and duodenum                           K25.9, Gastric ulcer, unspecified as acute or                            chronic, without hemorrhage or perforation                           Z98.890, Other specified postprocedural states                           D50.9, Iron  deficiency anemia, unspecified                           K92.1, Melena (includes Hematochezia)                           R19.5, Other fecal abnormalities CPT copyright 2019 [employer]. All rights reserved. The codes documented in this report are preliminary and upon coder review may  be revised to meet current compliance requirements. Justice Britain, MD 07/01/2019 12:24:43 PM Number of Addenda: 0

## 2019-07-01 NOTE — Anesthesia Preprocedure Evaluation (Signed)
Anesthesia Evaluation  Patient identified by MRN, date of birth, ID band Patient awake    Reviewed: Allergy & Precautions, NPO status , Patient's Chart, lab work & pertinent test results  Airway Mallampati: I  TM Distance: >3 FB Neck ROM: Full    Dental   Pulmonary former smoker,    Pulmonary exam normal        Cardiovascular hypertension, Pt. on medications Normal cardiovascular exam     Neuro/Psych    GI/Hepatic   Endo/Other  diabetes, Type 2  Renal/GU      Musculoskeletal   Abdominal   Peds  Hematology   Anesthesia Other Findings   Reproductive/Obstetrics                             Anesthesia Physical Anesthesia Plan  ASA: II  Anesthesia Plan: MAC   Post-op Pain Management:    Induction: Intravenous  PONV Risk Score and Plan: 1 and Ondansetron  Airway Management Planned: Simple Face Mask  Additional Equipment:   Intra-op Plan:   Post-operative Plan:   Informed Consent: I have reviewed the patients History and Physical, chart, labs and discussed the procedure including the risks, benefits and alternatives for the proposed anesthesia with the patient or authorized representative who has indicated his/her understanding and acceptance.       Plan Discussed with: CRNA and Surgeon  Anesthesia Plan Comments:         Anesthesia Quick Evaluation

## 2019-07-01 NOTE — Progress Notes (Signed)
ANTICOAGULATION CONSULT NOTE   Pharmacy Consult for Heparin > warfarin Indication: LV thrombus  No Known Allergies  Patient Measurements: Height: 5\' 11"  (180.3 cm) Weight: 161 lb 13.1 oz (73.4 kg) IBW/kg (Calculated) : 75.3  Heparin dosing weight: 90 kg  Vital Signs: Temp: 97.3 F (36.3 C) (11/01 1209) Temp Source: Temporal (11/01 1209) BP: 105/59 (11/01 1224) Pulse Rate: 95 (11/01 1224)  Labs: Recent Labs    06/29/19 0454 06/29/19 0852 06/30/19 0428 07/01/19 0322  HGB 7.3* 7.1* 6.7* 7.3*  HCT 22.8* 22.6* 21.3* 22.7*  PLT 315 326 352 353  LABPROT 16.0*  --  16.1* 15.3*  INR 1.3*  --  1.3* 1.2  HEPARINUNFRC 0.56  --  0.65  --   CREATININE 2.42*  --  2.16* 2.15*    Estimated Creatinine Clearance: 30.8 mL/min (A) (by C-G formula based on SCr of 2.15 mg/dL (H)).  Assessment: 78 y.o. 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 Heparin was stopped yesterday due to low Hg (Pt received 1 unit of PRBC). GI performed an upper GI endoscopy where they found no evidence of a patent sphincterotomy in the ampulla without ulceration or evidence of recent bleeding/oozing. Per Dr. Wynelle Cleveland, continue dosing warfarin tonight and resume heparin at 2200 on 11/01.  NR today is 1.2, down from 1.3. Hg is low 9>7.3>7.1>6.7>7.3. No overt signs of bleeding per RN.   Goal of Therapy:  INR 2-3 Monitor platelets by anticoagulation protocol: Yes   Plan:  Resume heparin at rate of 1,550 units/hr (at 2200 on 11/01). Give warfarin 5 mg x 1 tonight Daily Pt-INR Bridge with heparin until INR therapeutic.  Monitor CBC daily Watch for signs/symptoms of bleeding  Sherren Kerns, PharmD PGY1 Acute Care Pharmacy Resident 07/01/2019       12:49 PM

## 2019-07-02 ENCOUNTER — Telehealth: Payer: Self-pay

## 2019-07-02 LAB — CBC
HCT: 25.5 % — ABNORMAL LOW (ref 39.0–52.0)
Hemoglobin: 7.9 g/dL — ABNORMAL LOW (ref 13.0–17.0)
MCH: 29.6 pg (ref 26.0–34.0)
MCHC: 31 g/dL (ref 30.0–36.0)
MCV: 95.5 fL (ref 80.0–100.0)
Platelets: 443 10*3/uL — ABNORMAL HIGH (ref 150–400)
RBC: 2.67 MIL/uL — ABNORMAL LOW (ref 4.22–5.81)
RDW: 20.1 % — ABNORMAL HIGH (ref 11.5–15.5)
WBC: 12.2 10*3/uL — ABNORMAL HIGH (ref 4.0–10.5)
nRBC: 0 % (ref 0.0–0.2)

## 2019-07-02 LAB — COMPREHENSIVE METABOLIC PANEL
ALT: 32 U/L (ref 0–44)
AST: 41 U/L (ref 15–41)
Albumin: 1.9 g/dL — ABNORMAL LOW (ref 3.5–5.0)
Alkaline Phosphatase: 61 U/L (ref 38–126)
Anion gap: 11 (ref 5–15)
BUN: 52 mg/dL — ABNORMAL HIGH (ref 8–23)
CO2: 29 mmol/L (ref 22–32)
Calcium: 8.4 mg/dL — ABNORMAL LOW (ref 8.9–10.3)
Chloride: 97 mmol/L — ABNORMAL LOW (ref 98–111)
Creatinine, Ser: 2.24 mg/dL — ABNORMAL HIGH (ref 0.61–1.24)
GFR calc Af Amer: 32 mL/min — ABNORMAL LOW (ref >60)
GFR calc non Af Amer: 28 mL/min — ABNORMAL LOW (ref >60)
Glucose, Bld: 100 mg/dL — ABNORMAL HIGH (ref 70–99)
Potassium: 4.1 mmol/L (ref 3.5–5.1)
Sodium: 137 mmol/L (ref 135–145)
Total Bilirubin: 4 mg/dL — ABNORMAL HIGH (ref 0.3–1.2)
Total Protein: 7 g/dL (ref 6.5–8.1)

## 2019-07-02 LAB — GLUCOSE, CAPILLARY
Glucose-Capillary: 90 mg/dL (ref 70–99)
Glucose-Capillary: 177 mg/dL — ABNORMAL HIGH (ref 70–99)
Glucose-Capillary: 145 mg/dL — ABNORMAL HIGH (ref 70–99)

## 2019-07-02 LAB — COOXEMETRY PANEL
Carboxyhemoglobin: 2.6 % — ABNORMAL HIGH (ref 0.5–1.5)
Methemoglobin: 0.9 % (ref 0.0–1.5)
O2 Saturation: 59.7 %
Total hemoglobin: 10.4 g/dL — ABNORMAL LOW (ref 12.0–16.0)

## 2019-07-02 LAB — HEPARIN LEVEL (UNFRACTIONATED): Heparin Unfractionated: 0.41 IU/mL (ref 0.30–0.70)

## 2019-07-02 LAB — MAGNESIUM: Magnesium: 1.8 mg/dL (ref 1.7–2.4)

## 2019-07-02 LAB — PROTIME-INR
INR: 1.5 — ABNORMAL HIGH (ref 0.8–1.2)
Prothrombin Time: 17.7 seconds — ABNORMAL HIGH (ref 11.4–15.2)

## 2019-07-02 MED ORDER — POLYETHYLENE GLYCOL 3350 17 G PO PACK
17.0000 g | PACK | Freq: Once | ORAL | Status: AC
  Administered 2019-07-02: 17 g via ORAL
  Filled 2019-07-02: qty 1

## 2019-07-02 MED ORDER — WARFARIN SODIUM 5 MG PO TABS
5.0000 mg | ORAL_TABLET | Freq: Once | ORAL | Status: AC
  Administered 2019-07-02: 5 mg via ORAL
  Filled 2019-07-02: qty 1

## 2019-07-02 MED ORDER — MAGNESIUM SULFATE 2 GM/50ML IV SOLN
2.0000 g | Freq: Once | INTRAVENOUS | Status: AC
  Administered 2019-07-02: 2 g via INTRAVENOUS
  Filled 2019-07-02: qty 50

## 2019-07-02 MED ORDER — TORSEMIDE 20 MG PO TABS
20.0000 mg | ORAL_TABLET | Freq: Every day | ORAL | Status: DC
  Administered 2019-07-02 – 2019-07-04 (×3): 20 mg via ORAL
  Filled 2019-07-02 (×3): qty 1

## 2019-07-02 NOTE — Progress Notes (Signed)
Nutrition Follow up  DOCUMENTATION CODES:   Not applicable  INTERVENTION:   -Continue Ensure Enlive po TID, each supplement provides 350 kcal and 20 grams of protein -Continue MVI with minerals daily -Continue Magic cup TID with meals, each supplement provides 290 kcal and 9 grams of protein  NUTRITION DIAGNOSIS:   Inadequate oral intake related to altered GI function as evidenced by meal completion < 25%.  Ongoing  GOAL:   Patient will meet greater than or equal to 90% of their needs   Progressing   MONITOR:   Supplement acceptance, Diet advancement, PO intake, Labs, Weight trends, Skin, I & O's  REASON FOR ASSESSMENT:   Malnutrition Screening Tool    ASSESSMENT:   [name]  is a 78 y.o. 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 Constant,  Pt is not sure if worse with food. Pt had n/v x1, but no blood.  Pt denies fever, chills, cough, cp, palp, sob, diarrhea, constipation, brbpr, black stool, dysuria, hematuria.  Pt has chronic catheter due to ? Bph.  Pt presented due to worsening abdominal pain. Pt denies etoh use for the past 4 months.  10/20- s/p ERCP- revealed choledocholithiasis and purulent cholangitis founds and s/p biliary sphincterotomy and balloon sweeping 10/21- s/p BSE- recommend regular diet with thin liquids  Pt reports appetite has declined in last couple of days due to not having BM. Endorses early satiety upon meals and supplements. Meal completions charted as 75-100% for pt's last four meals. Pt would like to continue Ensure after he has BM. MD made aware, plan for bowel regimen.   Admission weight: 90 kg  Current weight: 74.3 kg   I/O: -28,793 ml since admit UOP: 2,200 ml x 24 hrs   Drips: Mg sulfate Medications: MVI with minerals,  fibercon, renvela, demadex Labs: CBG 102-136  Diet Order:   Diet Order            Diet Heart Room service appropriate? No; Fluid consistency: Thin  Diet effective now              EDUCATION NEEDS:   Education needs have been addressed  Skin:  Skin Assessment: Reviewed RN Assessment  Last BM:  10/30  Height:   Ht Readings from Last 1 Encounters:  07/01/19 5\' 11"  (1.803 m)    Weight:   Wt Readings from Last 1 Encounters:  07/02/19 74.3 kg    Ideal Body Weight:  78.2 kg  BMI:  Body mass index is 22.86 kg/m.  Estimated Nutritional Needs:   Kcal:  2050-2250  Protein:  95-110 grams  Fluid:  2 L   Mariana Single RD, LDN Clinical Nutrition Pager # [phone]

## 2019-07-02 NOTE — Progress Notes (Addendum)
Patient ID: [name], male   DOB: 08-19-1941, 78 y.o.   MRN: [id number]    Advanced Heart Failure Rounding Note   Subjective:    S/P scope-->  No gross lesions in esophagus proximally. LA Grade C esophagitis distally. 3 cm hiatal hernia.Erythematous mucosa in the gastric body and antrum. - Non-bleeding gastric ulcers with a clean ulcer base (Forrest Class III).  Hgb 7.3 => 7.9. Co-ox 60%.  Creatinine 2.24 up from 2.15. SBP 100s.    Denies SOB. Complaining of constipation.      Objective:   Weight Range:  Vital Signs:   Temp:  [97.3 F (36.3 C)-98.6 F (37 C)] 97.6 F (36.4 C) (11/02 0605) Pulse Rate:  [95-111] 110 (11/02 0605) Resp:  [17-24] 24 (11/02 0605) BP: (80-122)/(51-67) 106/64 (11/02 0605) SpO2:  [98 %-100 %] 99 % (11/02 0605) Weight:  [73.4 kg-74.3 kg] 74.3 kg (11/02 0605) Last BM Date: 06/29/19  Weight change: Filed Weights   07/01/19 0400 07/01/19 1054 07/02/19 0605  Weight: 73.4 kg 73.4 kg 74.3 kg    Intake/Output:   Intake/Output Summary (Last 24 hours) at 07/02/2019 0756 Last data filed at 07/02/2019 0200 Gross per 24 hour  Intake 571.22 ml  Output 1800 ml  Net -1228.78 ml   CVP 4 personally checked.   General:  No resp difficulty HEENT: normal Neck: supple. no JVD. Carotids 2+ bilat; no bruits. No lymphadenopathy or thryomegaly appreciated. Cor: PMI nondisplaced. Regular rate & rhythm. No rubs, gallops or murmurs. Lungs: clear Abdomen: soft, nontender, nondistended. No hepatosplenomegaly. No bruits or masses. Good bowel sounds. Extremities: no cyanosis, clubbing, rash, edema Neuro: alert & orientedx3, cranial nerves grossly intact. moves all 4 extremities w/o difficulty. Affect pleasant  Telemetry: Sinus Tach 100s. NSVT on 07/01/19   Labs: Basic Metabolic Panel: Recent Labs  Lab 06/28/19 0410 06/28/19 0920 06/29/19 0454 06/30/19 0428 07/01/19 0322 07/02/19 0500  NA 138  --  137 137 136 137  K 4.2  --  4.1 4.1 4.2 4.1  CL 95*  --   95* 97* 96* 97*  CO2 32  --  30 29 29 29   GLUCOSE 108*  --  108* 107* 103* 100*  BUN 88*  --  81* 70* 59* 52*  CREATININE 2.57*  --  2.42* 2.16* 2.15* 2.24*  CALCIUM 8.8*  --  8.4* 8.4* 8.4* 8.4*  MG  --  1.9  --   --   --  1.8    Liver Function Tests: Recent Labs  Lab 06/28/19 0410 06/29/19 0454 06/30/19 0428 07/01/19 0322 07/02/19 0500  AST 29 30 37 39 41  ALT 22 23 27 28  32  ALKPHOS 63 57 54 58 61  BILITOT 6.7* 5.3* 4.4* 4.1* 4.0*  PROT 7.0 6.4* 6.5 6.7 7.0  ALBUMIN 2.0* 1.8* 1.7* 1.8* 1.9*   No results for input(s): LIPASE, AMYLASE in the last 168 hours. No results for input(s): AMMONIA in the last 168 hours.  CBC: Recent Labs  Lab 06/29/19 0454 06/29/19 0852 06/30/19 0428 07/01/19 0322 07/02/19 0500  WBC 13.9* 13.2* 13.0* 11.9* 12.2*  HGB 7.3* 7.1* 6.7* 7.3* 7.9*  HCT 22.8* 22.6* 21.3* 22.7* 25.5*  MCV 91.9 92.6 93.8 91.9 95.5  PLT 315 326 352 353 443*    Cardiac Enzymes: No results for input(s): CKTOTAL, CKMB, CKMBINDEX, TROPONINI in the last 168 hours.  BNP: BNP (last 3 results) Recent Labs    08/16/18 0940 02/04/19 0906 06/18/19 2109  BNP 1,774.3* >4,500.0* 2,536.0*  ProBNP (last 3 results) No results for input(s): PROBNP in the last 8760 hours.    Other results:  Imaging: Dg Abd 2 Views  Result Date: 06/30/2019 CLINICAL DATA:  Abdominal distension. EXAM: ABDOMEN - 2 VIEW COMPARISON:  CT of the abdomen pelvis 06/20/2019 and ultrasound 06/29/2019 FINDINGS: Negative for free air. Bowel gas throughout the abdomen with a nonobstructive pattern. Residual oral contrast in the sigmoid colon demonstrates numerous colonic diverticula. Slight medialization of the bowel structures probably related to the known ascites. Evidence for urinary Foley catheter. Cardiac silhouette appears to be large for size. IMPRESSION: Nonobstructive bowel gas pattern. Colonic diverticula. Electronically Signed   By: Markus Daft M.D.   On: 06/30/2019 09:51      Medications:     Scheduled Medications: . sodium chloride   Intravenous Once  . Chlorhexidine Gluconate Cloth  6 each Topical Daily  . feeding supplement (ENSURE ENLIVE)  237 mL Oral BID BM  . finasteride  5 mg Oral Daily  . isosorbide-hydrALAZINE  0.5 tablet Oral TID  . multivitamin with minerals  1 tablet Oral Daily  . pantoprazole (PROTONIX) IV  40 mg Intravenous Q12H  . polycarbophil  625 mg Oral Daily  . saccharomyces boulardii  250 mg Oral BID  . sevelamer carbonate  800 mg Oral TID WC  . simethicone  160 mg Oral TID AC & HS  . sodium chloride flush  10-40 mL Intracatheter Q12H  . sodium chloride flush  3 mL Intravenous Once  . tamsulosin  0.4 mg Oral Daily  . torsemide  40 mg Oral Daily  . Warfarin - Pharmacist Dosing Inpatient   Does not apply q1800    Infusions: . ferumoxytol    . heparin 1,550 Units/hr (07/02/19 0200)    PRN Medications: alum & mag hydroxide-simeth, ondansetron (ZOFRAN) IV, sodium chloride flush   Assessment/Plan:   1. 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 Diureses and renal function improved w/ inotropic therapy.  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 Previously declined cath. He does, of note, have anterior Qs on ECG.  Not a LHC candidate due to baseline renal function. Echo this admission showed EF < 20%, severe LV dilation, small LV apical thrombus, severely dilated and dysfunctional RV with flattened septum. Dobutamine started on 10/23 and discontinued 10/28.  - CO-OX remains stable at 60%. CVP 4.  Creatinine 2.2 - Cut back torsemide to 20 mg daily.   - No ARB/ARNI or digoxin due to renal function.  - Continue low dose Bidil 1/2 tab tid.     - Continue TED hose.  2. AKI: On CKD stage 3.  Initial creatinine 2.8, peaked 5.16.  Todays creatinine 2.24.  Suspect cardiorenal syndrome likely with ATN.  Baseline creatinine was  1.5 in 1/20 and 2.5 in 6/20. Dialysis would be very difficult for him with severe biventricular failure.  3. LV thrombus: warfarin started 10/27, INR 1.5  - Back on heparin gtt.  - Continue warfarin. Ok with GI   4. Ascending cholangitis: S/p ERCP and stone removal. Abdominal US 10/30 did not show acute cholecystitis. He is off antibiotics.  5. Hyperbilirubinemia: Likely due to combination of CBD obstruction (now relieved) and RV failure with hepatic congestion/cirrhosis. Total bilirubin down to 4 today.  6. Hypokalemia: Resolved.  7. Anemia:Had dark stool 10/31, given 1 unit PRBCs 10/31 with hgb down to 6.7.  Hgb 7.9 today.  - EGD  negative for active bleeding. Non-bleeding ulcers noted.  - Plan for IV PPI x 48 hours followed by oral PPI twice a day. Also with recommendations for feraheme.  - Give feraheme today.  8. NSVT: in the setting of severe LV dysfunction. Keep K and Mg in normal range.  - Mag 1.8 today. Give 2 grams Mag now.   Amy Clegg NP-C  07/02/2019 7:56 AM  Patient seen with NP, agree with the above note.   EGD yesterday with esophagitis and nonbleeding ulcers.  Back on heparin gtt, getting warfarin.  CVP 4 with co-ox 60%.  Main complaint is constipation.   General: NAD Neck: No JVD, no thyromegaly or thyroid nodule.  Lungs: Clear to auscultation bilaterally with normal respiratory effort. CV: Nondisplaced PMI.  Heart regular S1/S2, no S3/S4, no murmur.  No peripheral edema.   Abdomen: Soft, nontender, no hepatosplenomegaly, no distention.  Skin: Intact without lesions or rashes.  Neurologic: Alert and oriented x 3.  Psych: Normal affect. Extremities: No clubbing or cyanosis.  HEENT: Normal.   Hgb higher at 7.9 today.  Will give feraheme.  He is back on heparin gtt + warfarin for LV thrombus with INR 1.5.  Stop heparin gtt when INR 2.  Continue IV PPI for 2 days then bid Protonix.   CVP 4 with co-ox 60%.  Off dobutamine and can decrease torsemide to 20 mg daily.   Continue Bidil 1/2 tab tid.  BUN/creatinine fairly stable.   Mobilize, will give laxative.   Loralie Champagne 07/02/2019 10:07 AM

## 2019-07-02 NOTE — Progress Notes (Addendum)
ANTICOAGULATION CONSULT NOTE   Pharmacy Consult for Heparin > warfarin Indication: LV thrombus  No Known Allergies  Patient Measurements: Height: 5\' 11"  (180.3 cm) Weight: 163 lb 14.4 oz (74.3 kg) IBW/kg (Calculated) : 75.3  Heparin dosing weight: 90 kg  Vital Signs: Temp: 97.6 F (36.4 C) (11/02 0605) Temp Source: Oral (11/02 0605) BP: 106/64 (11/02 0605) Pulse Rate: 110 (11/02 0605)  Labs: Recent Labs    06/30/19 0428 07/01/19 0322 07/02/19 0500 07/02/19 0630  HGB 6.7* 7.3* 7.9*  --   HCT 21.3* 22.7* 25.5*  --   PLT 352 353 443*  --   LABPROT 16.1* 15.3*  --  17.7*  INR 1.3* 1.2  --  1.5*  HEPARINUNFRC 0.65  --   --  0.41  CREATININE 2.16* 2.15* 2.24*  --     Estimated Creatinine Clearance: 29.9 mL/min (A) (by C-G formula based on SCr of 2.24 mg/dL (H)).  Assessment: 78 y.o. 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 Heparin was stopped yesterday due to low Hg (Pt received 1 unit of PRBC). GI performed an upper GI endoscopy where they found no evidence of a patent sphincterotomy in the ampulla without ulceration or evidence of recent bleeding/oozing. Heparin drip stopped yesterday but then resumed last pm - HL 0.4 at [facility]ol today.  INR today is 1.5 starting to bump- Hgb low 6.7 > improved post PRBC  No overt signs of bleeding per RN.   Goal of Therapy:  INR 2-3 HL 0.3-0.5 (low end of range with bleeding Monitor platelets by anticoagulation protocol: Yes   Plan:  Continue  heparin at rate of 1,550 units/hr  Give warfarin 5 mg x 1 tonight Daily Pt-INR Bridge with heparin until INR therapeutic.  Monitor CBC daily Watch for signs/symptoms of bleeding   Bonnita Nasuti Pharm.D. CPP, BCPS Clinical Pharmacist  07/02/2019 1:42 PM

## 2019-07-02 NOTE — Telephone Encounter (Signed)
-----   Message from Milus Banister, MD sent at 07/02/2019  6:12 AM EST ----- Thanks  Patty, Please offer him OV with me in 6-7 weeks.  He'll need cbc, cmet a day or so prior.  Thanks ----- Message ----- From: Irving Copas., MD Sent: 07/01/2019  12:30 PM EST To: Milus Banister, MD, Timothy Lasso, RN  Linna Hoff,  We were asked to see again because his LFT trend was slow to downtrend.  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 He also then had dark stools/melena and drop in Hgb. Just did EGD and was prepared for ERCP if he had a sphincterotomy bleed, but he just has some gastric ulcers and erosions.  If stable and doing much better may benefit from repeat EGD to ensure healing, but I'm not sure he will get there. Probably, best to get back in clinic in 6 weeks and see how things are before deciding any surveillance EGDs. We will get him IV Iron while inhouse. I'm going to sign off. Thanks. GM

## 2019-07-02 NOTE — Telephone Encounter (Signed)
Left message on machine to call back  

## 2019-07-02 NOTE — Progress Notes (Signed)
CARDIAC REHAB PHASE I   Offered to walk with pt. Pt states he doesn't feel as good this afternoon and has been trying to have a BM. Encouraged ambulation as able. Will follow-up as time allows to walk with pt.  [phone] Rufina Falco, RN BSN 07/02/2019 2:21 PM

## 2019-07-02 NOTE — Care Management Important Message (Signed)
Important Message  Patient Details  Name: [name] MRN: [id number] Date of Birth: Mar 13, 1941   Medicare Important Message Given:  Yes     Memory Argue 07/02/2019, 4:26 PM

## 2019-07-02 NOTE — Progress Notes (Signed)
PROGRESS NOTE    [name]   LZJ:[id number]  DOB: 07/19/1941  DOA: 06/18/2019 PCP: [name], MD   Brief Narrative:  [name] is a 78 y.o. 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 Percutaneous drain was pulled on 02/02/2018. H presents with c/o abdominal pain RUQ for the past 4-5 days. Diagnosed with choledocholithiasis and cholangitis.  10/20 underwent ERCP and stone removal and was also treated with Zosyn and Augmentin. Developed acute bi-V CHF and AKI on CKD 3 and treated with Dobutamine by CHF team. Nephrology also consulted for the same.   - 10/24 palliative care consulted 10/31 Hb dropped to 6.9 while on a Heparin infusion and was noted to have a bloody BM.   Subjective: Sitting up in a chair. He has no complaints.     Assessment & Plan:   Principal Problem: Acute blood loss anemia- melena on 10/31- esophagitis and PUD  - EGD today reveals LA grade C esophagitis, gastric ulcers, patent sphincterotomy- no bleeding at the time - per GI> H pylori serology, resume Heparin in 10 hrs without bolus, cont Protonix BID IV x 48 hrs and then oral BID for 2-3 mo, resume solid food, EGD in 3 months - will receive Feraheme 510 mg IV today  Acute bi- V CHF with cardiogenic shock - new drop in EF to < 20%- cont management per heart failure team - not a cath candidate due to renal function - now on Demadex orally - also receivign Bidil  LV thrombus - small LV thrombus - on warfarin with Heparin bridge-  - INR 1.5- will discharge when INR > 3   NSVT - keep K and Mg within normal limits  AKI on CKD 3 - baseline Cr ~ 2.5 - AKI suspected to be related to cardiorenal syndrome - Cr steadily improved from a peak of 5.16 to 2.24    Cholangitis due to bile duct calculus with obstruction with  concomitant ileus Hyperbilirubinemia - Cholangitis has been treated as mentioned above - Hyperbili likely due to cholangitis trended down from ~ 15  to ~ 4 now - ileus resolved    BPH with chronic indwelling foley  10/23 chronic foley removed and urology subsequently consulted to place a new one- 71f  coude placed- recommended f/u in 1-2 wks with [facility]  - cont Flomax and Finasteride   DM/? -not on medication at home- last A1c in computer from 2016 was 5.6 - sugars have been essentially normal - d/c CBGs   Time spent in minutes: 30 min   DVT prophylaxis: Heparin infusion Code Status: DNR Family Communication:  Disposition Plan:  home with HHPT when INR > 3 & no bleeding noted Consultants:    GI  Cardiology  Nephrology  General surgery  CHF team  Urology  Palliative care Procedures:   EGD Impression:               - No gross lesions in esophagus proximally. LA                            Grade C esophagitis distally.                           - 3 cm hiatal hernia.                           -  Erythematous mucosa in the gastric body and                            antrum.                           - Non-bleeding gastric ulcers with a clean ulcer                            base (Forrest Class III).                           - No gross lesions in the duodenal bulb, in the                            first portion of the duodenum and in the second                            portion of the duodenum.                           - Patent sphincterotomy was found without                            ulceration noted. Recommendation:           - The patient will be observed post-procedure,                            until all discharge criteria are met.                           - Return patient to hospital ward for ongoing care.                           - IV PPI BID x 48 more hours. Then transition to PO                            PPI BID thereafter. He should  continue this for the                            next 2-3 months.                           - IV Iron tomorrow would be reasonable to optimize                            Iron stores.                           - For him, may consider a Hgb goal >8 due to his                            significant heart history.                           -  Send H. pylori blood antigen to evaluate for                            exposure history and if positive he would benefit                            from Quadruple therapy.                           - Repeat upper endoscopy in 3 months to check                            healing could be considered based on his overall                            clinical health. He will be scheduled a follow up                            in GI clinic with Dr. Ardis Hughs.                           - If anticoagulation is necessary, would consider                            restarting heparin gtt in 10-hours without bolus                            and titrate upwards as necessary. Coumading                            reasonable to restart tomorrow or next day since it                            will take time to bring him back up if it is                            decided to use Coumadin. If NOAC, would wait for at                            least 48 more hours. Antimicrobials:  Anti-infectives (From admission, onward)   Start     Dose/Rate Route Frequency Ordered Stop   06/28/19 1630  cefTRIAXone (ROCEPHIN) 1 g in sodium chloride 0.9 % 100 mL IVPB  Status:  Discontinued     1 g 200 mL/hr over 30 Minutes Intravenous Every 24 hours 06/28/19 1620 06/30/19 0902   06/22/19 1000  amoxicillin-clavulanate (AUGMENTIN) 500-125 MG per tablet 500 mg     1 tablet Oral Every 12 hours 06/22/19 0815 06/26/19 2235   06/20/19 1400  piperacillin-tazobactam (ZOSYN) IVPB 2.25 g  Status:  Discontinued     2.25 g 100 mL/hr over 30 Minutes Intravenous Every 8 hours 06/20/19 1209 06/22/19 0815    06/19/19 0600  piperacillin-tazobactam (ZOSYN) IVPB 3.375 g  Status:  Discontinued     3.375 g 12.5 mL/hr over 240 Minutes Intravenous Every  8 hours 06/19/19 0150 06/20/19 1209   06/18/19 2330  piperacillin-tazobactam (ZOSYN) IVPB 3.375 g     3.375 g 100 mL/hr over 30 Minutes Intravenous  Once 06/18/19 2322 06/19/19 0058       Objective: Vitals:   07/01/19 1548 07/01/19 2045 07/01/19 2319 07/02/19 0605  BP: 95/61 (!) 107/57 105/62 106/64  Pulse: (!) 111 (!) 111 (!) 107 (!) 110  Resp: (!) 23 17 20  (!) 24  Temp: 97.8 F (36.6 C) 98.4 F (36.9 C) 98.6 F (37 C) 97.6 F (36.4 C)  TempSrc: Oral Oral Oral Oral  SpO2: 99% 98% 100% 99%  Weight:    74.3 kg  Height:        Intake/Output Summary (Last 24 hours) at 07/02/2019 1307 Last data filed at 07/02/2019 0800 Gross per 24 hour  Intake 701.22 ml  Output 1800 ml  Net -1098.78 ml   Filed Weights   07/01/19 0400 07/01/19 1054 07/02/19 0605  Weight: 73.4 kg 73.4 kg 74.3 kg    Examination: General exam: Appears comfortable  HEENT: PERRLA, oral mucosa moist, no sclera icterus or thrush Respiratory system: Clear to auscultation. Respiratory effort normal. Cardiovascular system: S1 & S2 heard,  No murmurs  Gastrointestinal system: Abdomen soft, non-tender, nondistended. Normal bowel sounds   Central nervous system: Alert and oriented. No focal neurological deficits. Extremities: No cyanosis, clubbing or edema Skin: No rashes or ulcers Psychiatry:  Mood & affect appropriate.    Data Reviewed: I have personally reviewed following labs and imaging studies  CBC: Recent Labs  Lab 06/29/19 0454 06/29/19 0852 06/30/19 0428 07/01/19 0322 07/02/19 0500  WBC 13.9* 13.2* 13.0* 11.9* 12.2*  HGB 7.3* 7.1* 6.7* 7.3* 7.9*  HCT 22.8* 22.6* 21.3* 22.7* 25.5*  MCV 91.9 92.6 93.8 91.9 95.5  PLT 315 326 352 353 060*   Basic Metabolic Panel: Recent Labs  Lab 06/28/19 0410 06/28/19 0920 06/29/19 0454 06/30/19 0428 07/01/19 0322  07/02/19 0500  NA 138  --  137 137 136 137  K 4.2  --  4.1 4.1 4.2 4.1  CL 95*  --  95* 97* 96* 97*  CO2 32  --  30 29 29 29   GLUCOSE 108*  --  108* 107* 103* 100*  BUN 88*  --  81* 70* 59* 52*  CREATININE 2.57*  --  2.42* 2.16* 2.15* 2.24*  CALCIUM 8.8*  --  8.4* 8.4* 8.4* 8.4*  MG  --  1.9  --   --   --  1.8   GFR: Estimated Creatinine Clearance: 29.9 mL/min (A) (by C-G formula based on SCr of 2.24 mg/dL (H)). Liver Function Tests: Recent Labs  Lab 06/28/19 0410 06/29/19 0454 06/30/19 0428 07/01/19 0322 07/02/19 0500  AST 29 30 37 39 41  ALT 22 23 27 28  32  ALKPHOS 63 57 54 58 61  BILITOT 6.7* 5.3* 4.4* 4.1* 4.0*  PROT 7.0 6.4* 6.5 6.7 7.0  ALBUMIN 2.0* 1.8* 1.7* 1.8* 1.9*   No results for input(s): LIPASE, AMYLASE in the last 168 hours. No results for input(s): AMMONIA in the last 168 hours. Coagulation Profile: Recent Labs  Lab 06/28/19 0410 06/29/19 0454 06/30/19 0428 07/01/19 0322 07/02/19 0630  INR 1.3* 1.3* 1.3* 1.2 1.5*   Cardiac Enzymes: No results for input(s): CKTOTAL, CKMB, CKMBINDEX, TROPONINI in the last 168 hours. BNP (last 3 results) No results for input(s): PROBNP in the last 8760 hours. HbA1C: No results for input(s): HGBA1C in the last 72 hours. CBG: Recent Labs  Lab 07/01/19 0339 07/01/19 0744 07/01/19 1108 07/01/19 1313 07/02/19 1214  GLUCAP 100* 106* 90 104* 177*   Lipid Profile: No results for input(s): CHOL, HDL, LDLCALC, TRIG, CHOLHDL, LDLDIRECT in the last 72 hours. Thyroid Function Tests: No results for input(s): TSH, T4TOTAL, FREET4, T3FREE, THYROIDAB in the last 72 hours. Anemia Panel: No results for input(s): VITAMINB12, FOLATE, FERRITIN, TIBC, IRON, RETICCTPCT in the last 72 hours. Urine analysis:    Component Value Date/Time   COLORURINE AMBER (A) 06/21/2019 0916   APPEARANCEUR CLOUDY (A) 06/21/2019 0916   LABSPEC 1.020 06/21/2019 0916   PHURINE 5.0 06/21/2019 0916   GLUCOSEU NEGATIVE 06/21/2019 0916   HGBUR  MODERATE (A) 06/21/2019 0916   BILIRUBINUR SMALL (A) 06/21/2019 0916   KETONESUR NEGATIVE 06/21/2019 0916   PROTEINUR 30 (A) 06/21/2019 0916   NITRITE NEGATIVE 06/21/2019 0916   LEUKOCYTESUR MODERATE (A) 06/21/2019 0916   Sepsis Labs: @LABRCNTIP (procalcitonin:4,lacticidven:4) ) No results found for this or any previous visit (from the past 240 hour(s)).       Radiology Studies: No results found.    Scheduled Meds: . sodium chloride   Intravenous Once  . Chlorhexidine Gluconate Cloth  6 each Topical Daily  . feeding supplement (ENSURE ENLIVE)  237 mL Oral BID BM  . finasteride  5 mg Oral Daily  . isosorbide-hydrALAZINE  0.5 tablet Oral TID  . multivitamin with minerals  1 tablet Oral Daily  . pantoprazole (PROTONIX) IV  40 mg Intravenous Q12H  . polycarbophil  625 mg Oral Daily  . saccharomyces boulardii  250 mg Oral BID  . sevelamer carbonate  800 mg Oral TID WC  . simethicone  160 mg Oral TID AC & HS  . sodium chloride flush  10-40 mL Intracatheter Q12H  . tamsulosin  0.4 mg Oral Daily  . torsemide  20 mg Oral Daily  . Warfarin - Pharmacist Dosing Inpatient   Does not apply q1800   Continuous Infusions: . ferumoxytol    . heparin 1,550 Units/hr (07/02/19 0200)  . magnesium sulfate bolus IVPB       LOS: 13 days      Debbe Odea, MD [facility] Pager: [email or web address] Password [email or web address] 07/02/2019, 1:07 PM

## 2019-07-02 NOTE — Progress Notes (Signed)
Physical Therapy Treatment Patient Details Name: [name] MRN: [id number] DOB: 16-Aug-1941 Today's Date: 07/02/2019    History of Present Illness Pt is a 78 y.o. M with HTN, sCHF EF 25%, CKD III baseline Cr 2, DM, PVD and alcohol use who presented 06/18/19 with 4-5 days progressive abdominal pain with vomiting.  Found to have choledocholithiasis and underwent ERCP.  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    PT Comments    Patient progressing well towards PT goals. Improved ambulation distance today with Min A for balance/safety with 2 instances of LOB requiring external support. BP soft but stable during session with no dizziness reported. Pt very proud of self for performance today. Recommend walking with nursing a few times per day. Will continue to follow and progress as tolerated.    Follow Up Recommendations  Home health PT;Supervision/Assistance - 24 hour     Equipment Recommendations  None recommended by PT    Recommendations for Other Services       Precautions / Restrictions Precautions Precautions: Fall Restrictions Weight Bearing Restrictions: No    Mobility  Bed Mobility Overal bed mobility: Needs Assistance Bed Mobility: Supine to Sit     Supine to sit: HOB elevated;Min guard     General bed mobility comments: Increased time with HOB elevated and use of rails. no dizziness.  Transfers Overall transfer level: Needs assistance Equipment used: Rolling walker (2 wheeled) Transfers: Sit to/from Stand Sit to Stand: Min assist         General transfer comment: Min A to rise from EOB. Stood from [employer], transferred to chair post ambulation.  Ambulation/Gait Ambulation/Gait assistance: Min assist;Min guard Gait Distance (Feet): 120 Feet Assistive device: Rolling walker (2 wheeled) Gait Pattern/deviations: Step-through pattern;Decreased stride length;Trunk flexed Gait velocity: Decreased   General Gait Details: Slow, mildly  unsteady gait with RW for support. Min A for balance esp with turns. LOB x2 towards the right requiring assist for support.   Stairs             Wheelchair Mobility    Modified Rankin (Stroke Patients Only)       Balance Overall balance assessment: Needs assistance Sitting-balance support: Feet supported;No upper extremity supported Sitting balance-Leahy Scale: Good     Standing balance support: During functional activity Standing balance-Leahy Scale: Poor Standing balance comment: Requires UE support for standing.                            Cognition Arousal/Alertness: Awake/alert Behavior During Therapy: WFL for tasks assessed/performed Overall Cognitive Status: Within Functional Limits for tasks assessed                                        Exercises      General Comments General comments (skin integrity, edema, etc.): Supine BP 103/56, sitting BP 111/61, BP post activity 91/62. Asymptomatic.      Pertinent Vitals/Pain Pain Assessment: No/denies pain    Home Living                      Prior Function            PT Goals (current goals can now be found in the care plan section) Progress towards PT goals: Progressing toward goals    Frequency    Min 3X/week  PT Plan Current plan remains appropriate    Co-evaluation              AM-PAC PT "6 Clicks" Mobility   Outcome Measure  Help needed turning from your back to your side while in a flat bed without using bedrails?: None Help needed moving from lying on your back to sitting on the side of a flat bed without using bedrails?: None Help needed moving to and from a bed to a chair (including a wheelchair)?: A Little Help needed standing up from a chair using your arms (e.g., wheelchair or bedside chair)?: A Little Help needed to walk in hospital room?: A Little Help needed climbing 3-5 steps with a railing? : A Little 6 Click Score: 20    End  of Session Equipment Utilized During Treatment: Gait belt Activity Tolerance: Patient tolerated treatment well Patient left: in chair;with call bell/phone within reach Nurse Communication: Mobility status PT Visit Diagnosis: Other abnormalities of gait and mobility (R26.89);Muscle weakness (generalized) (M62.81)     Time: 5009-3818 PT Time Calculation (min) (ACUTE ONLY): 24 min  Charges:  $Gait Training: 23-37 mins                     Marisa Severin, PT, DPT Acute Rehabilitation Services Pager [phone] Office [place] 07/02/2019, 1:11 PM

## 2019-07-03 LAB — PROTIME-INR
INR: 1.6 — ABNORMAL HIGH (ref 0.8–1.2)
Prothrombin Time: 19 seconds — ABNORMAL HIGH (ref 11.4–15.2)

## 2019-07-03 LAB — HEPARIN LEVEL (UNFRACTIONATED)
Heparin Unfractionated: 0.85 IU/mL — ABNORMAL HIGH (ref 0.30–0.70)
Heparin Unfractionated: >2.2 IU/mL — ABNORMAL HIGH (ref 0.30–0.70)
Heparin Unfractionated: 0.87 IU/mL — ABNORMAL HIGH (ref 0.30–0.70)

## 2019-07-03 LAB — CBC
HCT: 24.1 % — ABNORMAL LOW (ref 39.0–52.0)
Hemoglobin: 7.5 g/dL — ABNORMAL LOW (ref 13.0–17.0)
MCH: 30 pg (ref 26.0–34.0)
MCHC: 31.1 g/dL (ref 30.0–36.0)
MCV: 96.4 fL (ref 80.0–100.0)
Platelets: 440 10*3/uL — ABNORMAL HIGH (ref 150–400)
RBC: 2.5 MIL/uL — ABNORMAL LOW (ref 4.22–5.81)
RDW: 20.2 % — ABNORMAL HIGH (ref 11.5–15.5)
WBC: 9.6 10*3/uL (ref 4.0–10.5)
nRBC: 0 % (ref 0.0–0.2)

## 2019-07-03 LAB — COOXEMETRY PANEL
Carboxyhemoglobin: 2.8 % — ABNORMAL HIGH (ref 0.5–1.5)
Methemoglobin: 1.6 % — ABNORMAL HIGH (ref 0.0–1.5)
O2 Saturation: 59.4 %
Total hemoglobin: 7.8 g/dL — ABNORMAL LOW (ref 12.0–16.0)

## 2019-07-03 LAB — BASIC METABOLIC PANEL
Anion gap: 10 (ref 5–15)
BUN: 43 mg/dL — ABNORMAL HIGH (ref 8–23)
CO2: 27 mmol/L (ref 22–32)
Calcium: 8.1 mg/dL — ABNORMAL LOW (ref 8.9–10.3)
Chloride: 100 mmol/L (ref 98–111)
Creatinine, Ser: 2.09 mg/dL — ABNORMAL HIGH (ref 0.61–1.24)
GFR calc Af Amer: 35 mL/min — ABNORMAL LOW (ref >60)
GFR calc non Af Amer: 30 mL/min — ABNORMAL LOW (ref >60)
Glucose, Bld: 141 mg/dL — ABNORMAL HIGH (ref 70–99)
Potassium: 4 mmol/L (ref 3.5–5.1)
Sodium: 137 mmol/L (ref 135–145)

## 2019-07-03 LAB — H. PYLORI ANTIBODY, IGG: H Pylori IgG: 1.3 Index Value — ABNORMAL HIGH (ref 0.00–0.79)

## 2019-07-03 MED ORDER — POLYETHYLENE GLYCOL 3350 17 G PO PACK
17.0000 g | PACK | Freq: Every day | ORAL | Status: DC | PRN
  Administered 2019-07-03: 17 g via ORAL
  Filled 2019-07-03: qty 1

## 2019-07-03 MED ORDER — PANTOPRAZOLE SODIUM 40 MG PO TBEC
40.0000 mg | DELAYED_RELEASE_TABLET | Freq: Two times a day (BID) | ORAL | Status: DC
  Administered 2019-07-04 – 2019-07-09 (×11): 40 mg via ORAL
  Filled 2019-07-03 (×11): qty 1

## 2019-07-03 MED ORDER — BISACODYL 10 MG RE SUPP
10.0000 mg | Freq: Once | RECTAL | Status: AC
  Administered 2019-07-03: 10 mg via RECTAL
  Filled 2019-07-03: qty 1

## 2019-07-03 MED ORDER — WARFARIN SODIUM 7.5 MG PO TABS
7.5000 mg | ORAL_TABLET | Freq: Once | ORAL | Status: AC
  Administered 2019-07-03: 7.5 mg via ORAL
  Filled 2019-07-03: qty 1

## 2019-07-03 NOTE — Progress Notes (Signed)
ANTICOAGULATION CONSULT NOTE - Follow Up Consult  Pharmacy Consult for heparin Indication: LV thrombus  Labs: Recent Labs    07/01/19 0322 07/02/19 0500 07/02/19 0630 07/03/19 0534  HGB 7.3* 7.9*  --  7.5*  HCT 22.7* 25.5*  --  24.1*  PLT 353 443*  --  440*  LABPROT 15.3*  --  17.7*  --   INR 1.2  --  1.5*  --   HEPARINUNFRC  --   --  0.41 0.85*  CREATININE 2.15* 2.24*  --   --     Assessment: 77yo male supratherapeutic on heparin after one level at goal (heparin running through PICC and lab drawn through A-line); no gtt issues or signs of bleeding per RN.  Goal of Therapy:  Heparin level 0.3-0.5 units/ml   Plan:  Will decrease heparin gtt by 3 units/kg/hr to 1300 units/hr and check level in 8 hours.    Wynona Neat, PharmD, BCPS  07/03/2019,6:56 AM

## 2019-07-03 NOTE — Progress Notes (Signed)
Palliative Medicine RN Note: Rec'd call from pt's sister [name]. She reports she is trying to visit pt but isn't allowed in. I spoke with RN and confirmed with notes: patient is not dying and does not meet qualifications for 4 visitors. Patient may have one visitor per day, between the hours of 10am and 8am.  I called [employer] tower desk; family is there; the security team will explain the policy to them.  Troy Skiff Henslee, RN, BSN, [facility] Palliative Medicine Team 07/03/2019 11:28 AM Office [phone]

## 2019-07-03 NOTE — Telephone Encounter (Signed)
Left message on machine to call back  

## 2019-07-03 NOTE — Progress Notes (Signed)
ANTICOAGULATION CONSULT NOTE - Follow Up Consult  Pharmacy Consult for Heparin Indication: LV thrombus  No Known Allergies  Patient Measurements: Height: 5\' 11"  (180.3 cm) Weight: 164 lb 14.5 oz (74.8 kg) IBW/kg (Calculated) : 75.3 Heparin Dosing Weight: 74.8 kg  Vital Signs: Temp: 97.6 F (36.4 C) (11/03 1711) Temp Source: Oral (11/03 1711) BP: 107/50 (11/03 1711) Pulse Rate: 100 (11/03 1711)  Labs: Recent Labs    07/01/19 0322 07/02/19 0500  07/02/19 0630 07/03/19 0534 07/03/19 1041 07/03/19 1457 07/03/19 1645  HGB 7.3* 7.9*  --   --  7.5*  --   --   --   HCT 22.7* 25.5*  --   --  24.1*  --   --   --   PLT 353 443*  --   --  440*  --   --   --   LABPROT 15.3*  --   --  17.7* 19.0*  --   --   --   INR 1.2  --   --  1.5* 1.6*  --   --   --   HEPARINUNFRC  --   --    < > 0.41 0.85*  --  >2.20* 0.87*  CREATININE 2.15* 2.24*  --   --   --  2.09*  --   --    < > = values in this interval not displayed.    Estimated Creatinine Clearance: 32.3 mL/min (A) (by C-G formula based on SCr of 2.09 mg/dL (H)).   Assessment: Anticoag: Heparin for new LV thrombus. 10/26 start warf>>hold 10/29. INR 1.2>1.5>1.6. LDH 100s H/h ok today watch - Heparin levels 0.85>0.87 essentially unchanged.  Goal of Therapy:  Heparin level 0.3-0.5 units/ml Monitor platelets by anticoagulation protocol: Yes   Plan:  Decrease IV heparin again to 1100 units/hr.Recheck in 6 hrs. Daily HL and cBC  Crystal S. Alford Highland, PharmD, [facility] Clinical Staff Pharmacist Eilene Ghazi [place] 07/03/2019,5:33 PM

## 2019-07-03 NOTE — Progress Notes (Addendum)
Patient ID: [name], male   DOB: 05-31-1941, 78 y.o.   MRN: [id number]    Advanced Heart Failure Rounding Note   Subjective:    S/P scope-->  No gross lesions in esophagus proximally. LA Grade C esophagitis distally. 3 cm hiatal hernia.Erythematous mucosa in the gastric body and antrum. - Non-bleeding gastric ulcers with a clean ulcer base (Forrest Class III).  Hgb 7.3 => 7.9=>7.5   CO-OX 59%. .    Feeling ok. Only complaint is constipation.      Objective:   Weight Range:  Vital Signs:   Temp:  [97.8 F (36.6 C)-98.3 F (36.8 C)] 97.9 F (36.6 C) (11/03 0700) Pulse Rate:  [108-112] 111 (11/03 0700) Resp:  [17-24] 17 (11/03 0700) BP: (97-108)/(60-65) 106/65 (11/03 0700) SpO2:  [98 %-99 %] 99 % (11/03 0700) Weight:  [74.8 kg] 74.8 kg (11/03 0500) Last BM Date: 06/29/19  Weight change: Filed Weights   07/01/19 1054 07/02/19 0605 07/03/19 0500  Weight: 73.4 kg 74.3 kg 74.8 kg    Intake/Output:   Intake/Output Summary (Last 24 hours) at 07/03/2019 0759 Last data filed at 07/03/2019 0412 Gross per 24 hour  Intake 490 ml  Output 1400 ml  Net -910 ml   CVP 3 personally checked.   General:  No resp difficulty HEENT: normal Neck: supple. no JVD. Carotids 2+ bilat; no bruits. No lymphadenopathy or thryomegaly appreciated. Cor: PMI nondisplaced. Regular rate & rhythm. No rubs, gallops or murmurs. Lungs: clear Abdomen: soft, nontender, nondistended. No hepatosplenomegaly. No bruits or masses. Good bowel sounds. Extremities: no cyanosis, clubbing, rash, edema. R/LLE unna boots.  Neuro: alert & orientedx3, cranial nerves grossly intact. moves all 4 extremities w/o difficulty. Affect pleasant  Telemetry: Sinus Tach 100s personally checked.   Labs: Basic Metabolic Panel: Recent Labs  Lab 06/28/19 0410 06/28/19 0920 06/29/19 0454 06/30/19 0428 07/01/19 0322 07/02/19 0500  NA 138  --  137 137 136 137  K 4.2  --  4.1 4.1 4.2 4.1  CL 95*  --  95* 97* 96* 97*   CO2 32  --  30 29 29 29   GLUCOSE 108*  --  108* 107* 103* 100*  BUN 88*  --  81* 70* 59* 52*  CREATININE 2.57*  --  2.42* 2.16* 2.15* 2.24*  CALCIUM 8.8*  --  8.4* 8.4* 8.4* 8.4*  MG  --  1.9  --   --   --  1.8    Liver Function Tests: Recent Labs  Lab 06/28/19 0410 06/29/19 0454 06/30/19 0428 07/01/19 0322 07/02/19 0500  AST 29 30 37 39 41  ALT 22 23 27 28  32  ALKPHOS 63 57 54 58 61  BILITOT 6.7* 5.3* 4.4* 4.1* 4.0*  PROT 7.0 6.4* 6.5 6.7 7.0  ALBUMIN 2.0* 1.8* 1.7* 1.8* 1.9*   No results for input(s): LIPASE, AMYLASE in the last 168 hours. No results for input(s): AMMONIA in the last 168 hours.  CBC: Recent Labs  Lab 06/29/19 0852 06/30/19 0428 07/01/19 0322 07/02/19 0500 07/03/19 0534  WBC 13.2* 13.0* 11.9* 12.2* 9.6  HGB 7.1* 6.7* 7.3* 7.9* 7.5*  HCT 22.6* 21.3* 22.7* 25.5* 24.1*  MCV 92.6 93.8 91.9 95.5 96.4  PLT 326 352 353 443* 440*    Cardiac Enzymes: No results for input(s): CKTOTAL, CKMB, CKMBINDEX, TROPONINI in the last 168 hours.  BNP: BNP (last 3 results) Recent Labs    08/16/18 0940 02/04/19 0906 06/18/19 2109  BNP 1,774.3* >4,500.0* 2,536.0*    ProBNP (last  3 results) No results for input(s): PROBNP in the last 8760 hours.    Other results:  Imaging: No results found.   Medications:     Scheduled Medications: . sodium chloride   Intravenous Once  . Chlorhexidine Gluconate Cloth  6 each Topical Daily  . feeding supplement (ENSURE ENLIVE)  237 mL Oral BID BM  . finasteride  5 mg Oral Daily  . isosorbide-hydrALAZINE  0.5 tablet Oral TID  . multivitamin with minerals  1 tablet Oral Daily  . pantoprazole (PROTONIX) IV  40 mg Intravenous Q12H  . polycarbophil  625 mg Oral Daily  . saccharomyces boulardii  250 mg Oral BID  . sevelamer carbonate  800 mg Oral TID WC  . simethicone  160 mg Oral TID AC & HS  . sodium chloride flush  10-40 mL Intracatheter Q12H  . tamsulosin  0.4 mg Oral Daily  . torsemide  20 mg Oral Daily  .  Warfarin - Pharmacist Dosing Inpatient   Does not apply q1800    Infusions: . heparin 1,300 Units/hr (07/03/19 0657)    PRN Medications: alum & mag hydroxide-simeth, ondansetron (ZOFRAN) IV, sodium chloride flush   Assessment/Plan:   1. 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 Diureses and renal function improved w/ inotropic therapy.  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 Previously declined cath. He does, of note, have anterior Qs on ECG.  Not a LHC candidate due to baseline renal function. Echo this admission showed EF < 20%, severe LV dilation, small LV apical thrombus, severely dilated and dysfunctional RV with flattened septum. Dobutamine started on 10/23 and discontinued 10/28.  - CO-OX stable 60%.  Creatinine pending.  - CVP 3-4. Continue torsemide 20 mg daily.  - No ARB/ARNI or digoxin due to renal function.  - Continue low dose Bidil 1/2 tab tid.     2. AKI: On CKD stage 3.  Initial creatinine 2.8, peaked 5.16.  Todays creatinine pending.   Suspect cardiorenal syndrome likely with ATN.  Baseline creatinine was 1.5 in 1/20 and 2.5 in 6/20. Dialysis would be very difficult for him with severe biventricular failure.  3. LV thrombus: warfarin started 10/27, INR 1.6  - Back on heparin gtt.  - Continue warfarin. Ok with GI   4. Ascending cholangitis: S/p ERCP and stone removal. Abdominal US 10/30 did not show acute cholecystitis. He is off antibiotics.  5. Hyperbilirubinemia: Likely due to combination of CBD obstruction (now relieved) and RV failure with hepatic congestion/cirrhosis. Total bilirubin down to 4 today.  6. Hypokalemia: Resolved.  7. Anemia:Had dark stool 10/31, given 1 unit PRBCs 10/31 with hgb down to 6.7.  Hgb 7.5 today.  - EGD negative for active bleeding. Non-bleeding ulcers noted.  - Plan for IV PPI x 48 hours followed by oral PPI twice a day.  Also with recommendations for feraheme.  - Received Feraheme 07/02/19  8. NSVT: in the setting of severe LV dysfunction. Keep K and Mg in normal range.  Received Mag on 07/02/19   Darrick Grinder NP-C  07/03/2019 7:59 AM  Patient seen with NP, agree with the above note.   He is euvolemic on exam.   Seems stable today. CVP remains around 4 on torsemide 20 daily. Co-ox 60%.    No BMET today, will order.   Continue heparin gtt + warfarin until INR 2 then can stop heparin.  No overt bleeding but hgb  still low at 7.5.  Tranfuse < 7.   Loralie Champagne 07/03/2019

## 2019-07-03 NOTE — Progress Notes (Signed)
PROGRESS NOTE    [name]   IRW:[id number]  DOB: 03-11-1941  DOA: 06/18/2019 PCP: [name], MD   Brief Narrative:  [name] is a 78 y.o. 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 Percutaneous drain was pulled on 02/02/2018.   He presents with c/o abdominal pain RUQ for the past 4-5 days. Diagnosed with choledocholithiasis and cholangitis.  10/20 underwent ERCP and stone removal and was also treated with Zosyn and Augmentin.  Developed acute bi-V CHF and AKI on CKD 3  10/23- started on Dobutamine by CHF team. Nephrology also consulted for the same.  10/23- found to have an LV thrombus- started on heparin 10/24 palliative care consulted 10/31 Hb dropped to 6.9 while on a Heparin infusion and was noted to have a bloody BM. 11/1 underwent EGD- was no longer bleeding Now awaiting INR to become therapeutic which watching for rebleeding  Subjective: He feels constipated today. No nausea or abdominal pain. No dyspnea. No other complaints.     Assessment & Plan:   Principal Problem: Acute blood loss anemia- melena on 10/31- esophagitis and PUD  - transfused 1 U PRBC on 10/31 for Hb of 6.9 - 11/1- EGD  reveals LA grade C esophagitis, gastric ulcers, patent sphincterotomy- no bleeding at the time - per GI> H pylori serology, resume Heparin in 10 hrs without bolus, cont Protonix BID IV x 48 hrs and then oral BID for 2-3 mo, resume solid food, EGD in 3 months - 11/2 given Feraheme 510 mg IV  - follow for rebleeding while on Heparin bridge to Coumadin - Hb stable in 7-8 range  Acute bi- V CHF with cardiogenic shock - new drop in EF to < 20% - managed by heart failure team - not a cath candidate due to renal function - now euvolemic -  on Demadex orally- also receivign Bidil  LV thrombus - small  LV thrombus - on warfarin with Heparin bridge-  - INR 1.6- will discharge when INR > 3   NSVT - keep K and Mg within normal limits  AKI on CKD 3 - baseline Cr ~ 2.5 - AKI suspected to be related to cardiorenal syndrome - Cr steadily improved from a peak of 5.16 to 2.09    Cholangitis due to bile duct calculus with obstruction with concomitant ileus Hyperbilirubinemia - Cholangitis has been treated as mentioned above - Hyperbili likely due to cholangitis trended down from ~ 15  to ~ 4 yesterday - ileus resolved  Hypoalbuminemia- due to acute and chronic illness - Alb 1.9   BPH with chronic indwelling foley  10/23 chronic foley removed and urology subsequently consulted to place a new one- 10f  coude placed- recommended f/u in 1-2 wks with [facility]  - cont Flomax and Finasteride   DM/? -not on medication at home- last A1c in computer from 2016 was 5.6 - sugars have been essentially normal - d/c'd CBGs  Constipation - dulcolax suppository today  Time spent in minutes: 30 min   DVT prophylaxis: Heparin infusion Code Status: DNR Family Communication:  Disposition Plan:  home with HHPT when INR > 3 & no bleeding noted Consultants:    GI  Cardiology  Nephrology  General surgery  CHF team  Urology  Palliative care Procedures:  2 D ECHO   1. Left ventricular ejection fraction, by  visual estimation, is <20%. The left ventricle has severely decreased function. Left There is no left ventricular hypertrophy. The LV cavity size is severely dilated.  2. Small, fixed thrombus on the apical wall of the left ventricle.  3. Left ventricular diastolic Doppler parameters are consistent with pseudonormalization pattern of LV diastolic filling.  4. Right ventricular volume overload.  5. Global right ventricle has severely reduced systolic function.The right ventricular size is severely enlarged. No increase in right ventricular wall thickness. The interventricular  septum is flattened in diastole ('D' shaped left ventricle),  consistent with right ventricular volume overload  6. Left atrial size was severely dilated.  7. Right atrial size was severely dilated.  8. The mitral valve is normal in structure. Mild mitral valve regurgitation. No evidence of mitral stenosis.  9. The tricuspid valve is normal in structure. Tricuspid valve regurgitation mild-moderate. 10. The aortic valve is tricuspid Aortic valve regurgitation was not visualized by color flow Doppler. Mild to moderate aortic valve sclerosis/calcification without any evidence of aortic stenosis. 11. The pulmonic valve was normal in structure. Pulmonic valve regurgitation is mild by color flow Doppler. 12. Mildly elevated pulmonary artery systolic pressure. 13. The inferior vena cava is dilated in size with <50% respiratory variability, suggesting right atrial pressure of 15 mmHg.   EGD Impression:               - No gross lesions in esophagus proximally. LA                            Grade C esophagitis distally.                           - 3 cm hiatal hernia.                           - Erythematous mucosa in the gastric body and                            antrum.                           - Non-bleeding gastric ulcers with a clean ulcer                            base (Forrest Class III).                           - No gross lesions in the duodenal bulb, in the                            first portion of the duodenum and in the second                            portion of the duodenum.                           - Patent sphincterotomy was found without                            ulceration noted.  Recommendation:           - The patient will be observed post-procedure,                            until all discharge criteria are met.                           - Return patient to hospital ward for ongoing care.                           - IV PPI BID x 48 more hours. Then transition to PO                             PPI BID thereafter. He should continue this for the                            next 2-3 months.                           - IV Iron tomorrow would be reasonable to optimize                            Iron stores.                           - For him, may consider a Hgb goal >8 due to his                            significant heart history.                           - Send H. pylori blood antigen to evaluate for                            exposure history and if positive he would benefit                            from Quadruple therapy.                           - Repeat upper endoscopy in 3 months to check                            healing could be considered based on his overall                            clinical health. He will be scheduled a follow up                            in GI clinic with Dr. Ardis Hughs.                           - If anticoagulation is necessary, would consider  restarting heparin gtt in 10-hours without bolus                            and titrate upwards as necessary. Coumading                            reasonable to restart tomorrow or next day since it                            will take time to bring him back up if it is                            decided to use Coumadin. If NOAC, would wait for at                            least 48 more hours. Antimicrobials:  Anti-infectives (From admission, onward)   Start     Dose/Rate Route Frequency Ordered Stop   06/28/19 1630  cefTRIAXone (ROCEPHIN) 1 g in sodium chloride 0.9 % 100 mL IVPB  Status:  Discontinued     1 g 200 mL/hr over 30 Minutes Intravenous Every 24 hours 06/28/19 1620 06/30/19 0902   06/22/19 1000  amoxicillin-clavulanate (AUGMENTIN) 500-125 MG per tablet 500 mg     1 tablet Oral Every 12 hours 06/22/19 0815 06/26/19 2235   06/20/19 1400  piperacillin-tazobactam (ZOSYN) IVPB 2.25 g  Status:  Discontinued     2.25 g 100 mL/hr over 30 Minutes  Intravenous Every 8 hours 06/20/19 1209 06/22/19 0815   06/19/19 0600  piperacillin-tazobactam (ZOSYN) IVPB 3.375 g  Status:  Discontinued     3.375 g 12.5 mL/hr over 240 Minutes Intravenous Every 8 hours 06/19/19 0150 06/20/19 1209   06/18/19 2330  piperacillin-tazobactam (ZOSYN) IVPB 3.375 g     3.375 g 100 mL/hr over 30 Minutes Intravenous  Once 06/18/19 2322 06/19/19 0058       Objective: Vitals:   07/03/19 0700 07/03/19 1026 07/03/19 1135 07/03/19 1200  BP: 106/65 (!) 98/51  98/61  Pulse: (!) 111 (!) 111 (!) 107 (!) 106  Resp: 17 18 18 18   Temp: 97.9 F (36.6 C) 97.6 F (36.4 C) 97.9 F (36.6 C) (!) 97.3 F (36.3 C)  TempSrc: Oral Oral Oral Oral  SpO2: 99% 97%  99%  Weight:      Height:        Intake/Output Summary (Last 24 hours) at 07/03/2019 1218 Last data filed at 07/03/2019 1022 Gross per 24 hour  Intake 380 ml  Output 1400 ml  Net -1020 ml   Filed Weights   07/01/19 1054 07/02/19 0605 07/03/19 0500  Weight: 73.4 kg 74.3 kg 74.8 kg    Examination: General exam: Appears comfortable  HEENT: PERRLA, oral mucosa moist, no sclera icterus or thrush Respiratory system: Clear to auscultation. Respiratory effort normal. Cardiovascular system: S1 & S2 heard,  No murmurs  Gastrointestinal system: Abdomen soft, non-tender, moderately distended. Normal bowel sounds   Central nervous system: Alert and oriented. No focal neurological deficits. Extremities: No cyanosis, clubbing or edema Skin: No rashes or ulcers Psychiatry:  Mood & affect appropriate.    Data Reviewed: I have personally reviewed following labs and imaging studies  CBC: Recent Labs  Lab 06/29/19 0852 06/30/19 0428 07/01/19  4782 07/02/19 0500 07/03/19 0534  WBC 13.2* 13.0* 11.9* 12.2* 9.6  HGB 7.1* 6.7* 7.3* 7.9* 7.5*  HCT 22.6* 21.3* 22.7* 25.5* 24.1*  MCV 92.6 93.8 91.9 95.5 96.4  PLT 326 352 353 443* 956*   Basic Metabolic Panel: Recent Labs  Lab 06/28/19 0920 06/29/19 0454 06/30/19  0428 07/01/19 0322 07/02/19 0500 07/03/19 1041  NA  --  137 137 136 137 137  K  --  4.1 4.1 4.2 4.1 4.0  CL  --  95* 97* 96* 97* 100  CO2  --  30 29 29 29 27   GLUCOSE  --  108* 107* 103* 100* 141*  BUN  --  81* 70* 59* 52* 43*  CREATININE  --  2.42* 2.16* 2.15* 2.24* 2.09*  CALCIUM  --  8.4* 8.4* 8.4* 8.4* 8.1*  MG 1.9  --   --   --  1.8  --    GFR: Estimated Creatinine Clearance: 32.3 mL/min (A) (by C-G formula based on SCr of 2.09 mg/dL (H)). Liver Function Tests: Recent Labs  Lab 06/28/19 0410 06/29/19 0454 06/30/19 0428 07/01/19 0322 07/02/19 0500  AST 29 30 37 39 41  ALT 22 23 27 28  32  ALKPHOS 63 57 54 58 61  BILITOT 6.7* 5.3* 4.4* 4.1* 4.0*  PROT 7.0 6.4* 6.5 6.7 7.0  ALBUMIN 2.0* 1.8* 1.7* 1.8* 1.9*   No results for input(s): LIPASE, AMYLASE in the last 168 hours. No results for input(s): AMMONIA in the last 168 hours. Coagulation Profile: Recent Labs  Lab 06/29/19 0454 06/30/19 0428 07/01/19 0322 07/02/19 0630 07/03/19 0534  INR 1.3* 1.3* 1.2 1.5* 1.6*   Cardiac Enzymes: No results for input(s): CKTOTAL, CKMB, CKMBINDEX, TROPONINI in the last 168 hours. BNP (last 3 results) No results for input(s): PROBNP in the last 8760 hours. HbA1C: No results for input(s): HGBA1C in the last 72 hours. CBG: Recent Labs  Lab 07/01/19 0744 07/01/19 1108 07/01/19 1313 07/02/19 1214 07/02/19 1656  GLUCAP 106* 90 104* 177* 145*   Lipid Profile: No results for input(s): CHOL, HDL, LDLCALC, TRIG, CHOLHDL, LDLDIRECT in the last 72 hours. Thyroid Function Tests: No results for input(s): TSH, T4TOTAL, FREET4, T3FREE, THYROIDAB in the last 72 hours. Anemia Panel: No results for input(s): VITAMINB12, FOLATE, FERRITIN, TIBC, IRON, RETICCTPCT in the last 72 hours. Urine analysis:    Component Value Date/Time   COLORURINE AMBER (A) 06/21/2019 0916   APPEARANCEUR CLOUDY (A) 06/21/2019 0916   LABSPEC 1.020 06/21/2019 0916   PHURINE 5.0 06/21/2019 0916   GLUCOSEU  NEGATIVE 06/21/2019 0916   HGBUR MODERATE (A) 06/21/2019 0916   BILIRUBINUR SMALL (A) 06/21/2019 0916   KETONESUR NEGATIVE 06/21/2019 0916   PROTEINUR 30 (A) 06/21/2019 0916   NITRITE NEGATIVE 06/21/2019 0916   LEUKOCYTESUR MODERATE (A) 06/21/2019 0916   Sepsis Labs: @LABRCNTIP (procalcitonin:4,lacticidven:4) ) No results found for this or any previous visit (from the past 240 hour(s)).       Radiology Studies: No results found.    Scheduled Meds: . sodium chloride   Intravenous Once  . Chlorhexidine Gluconate Cloth  6 each Topical Daily  . feeding supplement (ENSURE ENLIVE)  237 mL Oral BID BM  . finasteride  5 mg Oral Daily  . isosorbide-hydrALAZINE  0.5 tablet Oral TID  . multivitamin with minerals  1 tablet Oral Daily  . pantoprazole (PROTONIX) IV  40 mg Intravenous Q12H  . polycarbophil  625 mg Oral Daily  . saccharomyces boulardii  250 mg Oral BID  .  sevelamer carbonate  800 mg Oral TID WC  . simethicone  160 mg Oral TID AC & HS  . sodium chloride flush  10-40 mL Intracatheter Q12H  . tamsulosin  0.4 mg Oral Daily  . torsemide  20 mg Oral Daily  . Warfarin - Pharmacist Dosing Inpatient   Does not apply q1800   Continuous Infusions: . heparin 1,300 Units/hr (07/03/19 0943)     LOS: 14 days      Debbe Odea, MD [facility] Pager: [email or web address] Password [facility] 07/03/2019, 12:18 PM

## 2019-07-03 NOTE — Progress Notes (Signed)
ANTICOAGULATION CONSULT NOTE   Pharmacy Consult for Heparin > warfarin Indication: LV thrombus  No Known Allergies  Patient Measurements: Height: 5\' 11"  (180.3 cm) Weight: 164 lb 14.5 oz (74.8 kg) IBW/kg (Calculated) : 75.3  Heparin dosing weight: 90 kg  Vital Signs: Temp: 97.3 F (36.3 C) (11/03 1200) Temp Source: Oral (11/03 1200) BP: 98/61 (11/03 1200) Pulse Rate: 106 (11/03 1200)  Labs: Recent Labs    07/01/19 0322 07/02/19 0500 07/02/19 0630 07/03/19 0534 07/03/19 1041  HGB 7.3* 7.9*  --  7.5*  --   HCT 22.7* 25.5*  --  24.1*  --   PLT 353 443*  --  440*  --   LABPROT 15.3*  --  17.7* 19.0*  --   INR 1.2  --  1.5* 1.6*  --   HEPARINUNFRC  --   --  0.41 0.85*  --   CREATININE 2.15* 2.24*  --   --  2.09*    Estimated Creatinine Clearance: 32.3 mL/min (A) (by C-G formula based on SCr of 2.09 mg/dL (H)).  Assessment: 78 y.o. 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 Heparin was stopped yesterday due to low Hg (Pt received 1 unit of PRBC). GI performed an upper GI endoscopy where they found no evidence of a patent sphincterotomy in the ampulla without ulceration or evidence of recent bleeding/oozing.  INR today is 1.6, slowly increasing  starting to bump. Hgb stable at 7.5, plt 440 - stable today. Awaiting heparin level now. No overt signs of bleeding per RN. No infusion issues.   Goal of Therapy:  INR 2-3 HL 0.3-0.5 (low end of range with bleeding Monitor platelets by anticoagulation protocol: Yes   Plan:  Will adjust heparin infusion pending heparin level result Give warfarin 7.5 mg x 1 tonight Daily Pt-INR Bridge with heparin until INR therapeutic.  Monitor CBC daily Watch for signs/symptoms of bleeding   Antonietta Jewel, PharmD, BCCCP Clinical Pharmacist  Phone: [phone]  Please check AMION for all [place] phone numbers After 10:00 PM, call [place] [phone] 07/03/2019 3:57 PM

## 2019-07-04 ENCOUNTER — Encounter (HOSPITAL_COMMUNITY): Payer: Self-pay | Admitting: Gastroenterology

## 2019-07-04 LAB — CBC
HCT: 24.6 % — ABNORMAL LOW (ref 39.0–52.0)
Hemoglobin: 7.5 g/dL — ABNORMAL LOW (ref 13.0–17.0)
MCH: 29.6 pg (ref 26.0–34.0)
MCHC: 30.5 g/dL (ref 30.0–36.0)
MCV: 97.2 fL (ref 80.0–100.0)
Platelets: 450 10*3/uL — ABNORMAL HIGH (ref 150–400)
RBC: 2.53 MIL/uL — ABNORMAL LOW (ref 4.22–5.81)
RDW: 19.9 % — ABNORMAL HIGH (ref 11.5–15.5)
WBC: 9.2 10*3/uL (ref 4.0–10.5)
nRBC: 0 % (ref 0.0–0.2)

## 2019-07-04 LAB — BASIC METABOLIC PANEL
Anion gap: 8 (ref 5–15)
BUN: 41 mg/dL — ABNORMAL HIGH (ref 8–23)
CO2: 28 mmol/L (ref 22–32)
Calcium: 8.5 mg/dL — ABNORMAL LOW (ref 8.9–10.3)
Chloride: 101 mmol/L (ref 98–111)
Creatinine, Ser: 2.09 mg/dL — ABNORMAL HIGH (ref 0.61–1.24)
GFR calc Af Amer: 35 mL/min — ABNORMAL LOW (ref >60)
GFR calc non Af Amer: 30 mL/min — ABNORMAL LOW (ref >60)
Glucose, Bld: 90 mg/dL (ref 70–99)
Potassium: 4.5 mmol/L (ref 3.5–5.1)
Sodium: 137 mmol/L (ref 135–145)

## 2019-07-04 LAB — PROTIME-INR
INR: 1.6 — ABNORMAL HIGH (ref 0.8–1.2)
Prothrombin Time: 18.6 seconds — ABNORMAL HIGH (ref 11.4–15.2)

## 2019-07-04 LAB — COOXEMETRY PANEL
Carboxyhemoglobin: 2.6 % — ABNORMAL HIGH (ref 0.5–1.5)
Methemoglobin: 1.1 % (ref 0.0–1.5)
O2 Saturation: 63.2 %
Total hemoglobin: 7.8 g/dL — ABNORMAL LOW (ref 12.0–16.0)

## 2019-07-04 LAB — HEPARIN LEVEL (UNFRACTIONATED)
Heparin Unfractionated: 0.31 IU/mL (ref 0.30–0.70)
Heparin Unfractionated: 0.46 IU/mL (ref 0.30–0.70)

## 2019-07-04 LAB — HCV AB W REFLEX TO QUANT PCR: HCV Ab: 0.1 s/co ratio (ref 0.0–0.9)

## 2019-07-04 LAB — HCV INTERPRETATION

## 2019-07-04 LAB — HEPATITIS B SURFACE ANTIBODY, QUANTITATIVE: Hep B S AB Quant (Post): <3.1 m[IU]/mL — ABNORMAL LOW (ref >9.9)

## 2019-07-04 MED ORDER — WARFARIN SODIUM 7.5 MG PO TABS
7.5000 mg | ORAL_TABLET | Freq: Once | ORAL | Status: AC
  Administered 2019-07-04: 7.5 mg via ORAL
  Filled 2019-07-04: qty 1

## 2019-07-04 NOTE — Telephone Encounter (Signed)
Left message on machine to call back  Will mail letter

## 2019-07-04 NOTE — Progress Notes (Addendum)
Patient ID: [name], male   DOB: 06/12/1941, 78 y.o.   MRN: [id number]    Advanced Heart Failure Rounding Note   Subjective:    S/P scope-->  No gross lesions in esophagus proximally. LA Grade C esophagitis distally. 3 cm hiatal hernia.Erythematous mucosa in the gastric body and antrum. - Non-bleeding gastric ulcers with a clean ulcer base (Forrest Class III).  Hgb 7.3 => 7.9=>7.5 =>7.5   CO-OX 63 %.  CVP 6. MAPs low 70s  On PO diuretic. Torsemide 20 daily. 1.8 L out yesterday. Net negative 31L total this admit. SCr stable at 2.09  Out of bed sitting in chairs. Looks well. Feels well. No dyspnea or CP. No supplemental O2 requirements.     Objective:   Weight Range:  Vital Signs:   Temp:  [97.3 F (36.3 C)-98 F (36.7 C)] 97.8 F (36.6 C) (11/04 1149) Pulse Rate:  [99-112] 112 (11/04 0421) Resp:  [15-27] 20 (11/04 1149) BP: (93-114)/(50-73) 93/51 (11/04 1149) SpO2:  [95 %-100 %] 100 % (11/04 1149) Weight:  [74.3 kg] 74.3 kg (11/04 0500) Last BM Date: 07/03/19  Weight change: Filed Weights   07/02/19 0605 07/03/19 0500 07/04/19 0500  Weight: 74.3 kg 74.8 kg 74.3 kg    Intake/Output:   Intake/Output Summary (Last 24 hours) at 07/04/2019 1153 Last data filed at 07/04/2019 0800 Gross per 24 hour  Intake 290 ml  Output 1800 ml  Net -1510 ml   CVP 6  PHYSICAL EXAM: General:  Elderly AAM, thin, No respiratory difficulty HEENT: normal Neck: supple. no JVD. Carotids 2+ bilat; no bruits. No lymphadenopathy or thyromegaly appreciated. Cor: PMI nondisplaced. Regular rate & rhythm. No rubs, gallops or murmurs. Lungs: clear Abdomen: soft, nontender, nondistended. No hepatosplenomegaly. No bruits or masses. Good bowel sounds. Extremities: no cyanosis, clubbing, rash, edema Neuro: alert & oriented x 3, cranial nerves grossly intact. moves all 4 extremities w/o difficulty. Affect pleasant.   Telemetry: Sinus Tach 100s, 7 beat run of NSVT personally checked.   Labs:  Basic Metabolic Panel: Recent Labs  Lab 06/28/19 0920  06/30/19 0428 07/01/19 0322 07/02/19 0500 07/03/19 1041 07/04/19 0500  NA  --    < > 137 136 137 137 137  K  --    < > 4.1 4.2 4.1 4.0 4.5  CL  --    < > 97* 96* 97* 100 101  CO2  --    < > 29 29 29 27 28   GLUCOSE  --    < > 107* 103* 100* 141* 90  BUN  --    < > 70* 59* 52* 43* 41*  CREATININE  --    < > 2.16* 2.15* 2.24* 2.09* 2.09*  CALCIUM  --    < > 8.4* 8.4* 8.4* 8.1* 8.5*  MG 1.9  --   --   --  1.8  --   --    < > = values in this interval not displayed.    Liver Function Tests: Recent Labs  Lab 06/28/19 0410 06/29/19 0454 06/30/19 0428 07/01/19 0322 07/02/19 0500  AST 29 30 37 39 41  ALT 22 23 27 28  32  ALKPHOS 63 57 54 58 61  BILITOT 6.7* 5.3* 4.4* 4.1* 4.0*  PROT 7.0 6.4* 6.5 6.7 7.0  ALBUMIN 2.0* 1.8* 1.7* 1.8* 1.9*   No results for input(s): LIPASE, AMYLASE in the last 168 hours. No results for input(s): AMMONIA in the last 168 hours.  CBC: Recent Labs  Lab  06/30/19 0428 07/01/19 0322 07/02/19 0500 07/03/19 0534 07/04/19 0500  WBC 13.0* 11.9* 12.2* 9.6 9.2  HGB 6.7* 7.3* 7.9* 7.5* 7.5*  HCT 21.3* 22.7* 25.5* 24.1* 24.6*  MCV 93.8 91.9 95.5 96.4 97.2  PLT 352 353 443* 440* 450*    Cardiac Enzymes: No results for input(s): CKTOTAL, CKMB, CKMBINDEX, TROPONINI in the last 168 hours.  BNP: BNP (last 3 results) Recent Labs    08/16/18 0940 02/04/19 0906 06/18/19 2109  BNP 1,774.3* >4,500.0* 2,536.0*    ProBNP (last 3 results) No results for input(s): PROBNP in the last 8760 hours.    Other results:  Imaging: No results found.   Medications:     Scheduled Medications: . sodium chloride   Intravenous Once  . Chlorhexidine Gluconate Cloth  6 each Topical Daily  . feeding supplement (ENSURE ENLIVE)  237 mL Oral BID BM  . finasteride  5 mg Oral Daily  . isosorbide-hydrALAZINE  0.5 tablet Oral TID  . multivitamin with minerals  1 tablet Oral Daily  . pantoprazole  40 mg Oral  BID AC  . polycarbophil  625 mg Oral Daily  . sevelamer carbonate  800 mg Oral TID WC  . simethicone  160 mg Oral TID AC & HS  . sodium chloride flush  10-40 mL Intracatheter Q12H  . tamsulosin  0.4 mg Oral Daily  . torsemide  20 mg Oral Daily  . Warfarin - Pharmacist Dosing Inpatient   Does not apply q1800    Infusions: . heparin 1,150 Units/hr (07/04/19 1147)    PRN Medications: alum & mag hydroxide-simeth, ondansetron (ZOFRAN) IV, polyethylene glycol, sodium chloride flush   Assessment/Plan:   1. 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 Diureses and renal function improved w/ inotropic therapy.  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 Previously declined cath. He does, of note, have anterior Qs on ECG.  Not a LHC candidate due to baseline renal function. Echo this admission showed EF < 20%, severe LV dilation, small LV apical thrombus, severely dilated and dysfunctional RV with flattened septum. Dobutamine started on 10/23 and discontinued 10/28.  - CO-OX stable 63%.   - CVP 6. Continue torsemide 20 mg daily. SCr stable at 2.09 - No ARB/ARNI or digoxin due to renal function.  - Continue low dose Bidil 1/2 tab tid.     2. AKI: On CKD stage 3.  Initial creatinine 2.8, peaked 5.16.  Stable now at 2.09. Suspect cardiorenal syndrome likely with ATN.  Baseline creatinine was 1.5 in 1/20 and 2.5 in 6/20. Dialysis would be very difficult for him with severe biventricular failure.  3. LV thrombus: warfarin started 10/27, INR 1.6  - Back on heparin gtt for bridge until INR reaches 2.0 - Continue warfarin. Ok with GI   4. Ascending cholangitis: S/p ERCP and stone removal. Abdominal US 10/30 did not show acute cholecystitis. He is off antibiotics.  5. Hyperbilirubinemia: Likely due to combination of CBD obstruction (now relieved) and RV failure with hepatic  congestion/cirrhosis.  6. Hypokalemia: Resolved. K 4.5 today 7. Anemia:Had dark stool 10/31, given 1 unit PRBCs 10/31 with hgb down to 6.7.  Hgb 7.5 today (stable past 24 hrs).  - EGD negative for active bleeding. Non-bleeding ulcers noted.  - Plan for IV PPI x 48 hours followed by oral PPI twice a day. Also with recommendations for feraheme.  - Received Feraheme 07/02/19  8. NSVT: 7  beat run on tele. in the setting of severe LV dysfunction. Keep K and Mg in normal range.  -Received Mag on 07/02/19  -K 4.5 today  Brittainy Simmons PA-C  07/04/2019 11:53 AM    Stable hgb and creatinine.  Waiting for INR to get to 2, can then stop IV heparin and let him go home.  No medication changes.   Loralie Champagne 07/04/2019

## 2019-07-04 NOTE — Progress Notes (Signed)
ANTICOAGULATION CONSULT NOTE - Follow Up Consult  Pharmacy Consult for Heparin Indication: LV thrombus  No Known Allergies  Patient Measurements: Height: 5\' 11"  (180.3 cm) Weight: 164 lb 14.5 oz (74.8 kg) IBW/kg (Calculated) : 75.3 Heparin Dosing Weight: 74.8 kg  Vital Signs: Temp: 98 F (36.7 C) (11/04 0031) Temp Source: Oral (11/04 0031) BP: 108/68 (11/04 0031) Pulse Rate: 103 (11/04 0031)  Labs: Recent Labs    07/01/19 0322 07/02/19 0500  07/02/19 0630 07/03/19 0534 07/03/19 1041 07/03/19 1457 07/03/19 1645 07/04/19 0010  HGB 7.3* 7.9*  --   --  7.5*  --   --   --   --   HCT 22.7* 25.5*  --   --  24.1*  --   --   --   --   PLT 353 443*  --   --  440*  --   --   --   --   LABPROT 15.3*  --   --  17.7* 19.0*  --   --   --   --   INR 1.2  --   --  1.5* 1.6*  --   --   --   --   HEPARINUNFRC  --   --    < > 0.41 0.85*  --  >2.20* 0.87* 0.46  CREATININE 2.15* 2.24*  --   --   --  2.09*  --   --   --    < > = values in this interval not displayed.    Estimated Creatinine Clearance: 32.3 mL/min (A) (by C-G formula based on SCr of 2.09 mg/dL (H)).   Assessment: Anticoag: Heparin for new LV thrombus. 10/26 start warf>>hold 10/29. INR 1.2>1.5>1.6. LDH 100s H/h ok today watch Heparin level 0.46 units/ml  Goal of Therapy:  Heparin level 0.3-0.5 units/ml Monitor platelets by anticoagulation protocol: Yes   Plan:  Continue IV heparin 1100 units/hr Daily HL and cBC  Seay, Lora Poteet 07/04/2019,12:54 AM

## 2019-07-04 NOTE — Progress Notes (Signed)
PROGRESS NOTE    [name]  PNT:[id number] DOB: Jan 28, 1941 DOA: 06/18/2019 PCP: [name], MD   Brief Narrative:  78 year old with history of HTN, HLD, DM2, CKD stage IIIb, systolic CHF ejection fraction 25-30%, chronic Foley, alcohol use, sepsis secondary to acute cholecystitis status post IR cholecystostomy initially presented with abdominal pain diagnosed with cholelithiasis and cholangitis.  ERCP performed 10/20-stone was removed.  Hospital course complicated by acute biventricular failure and acute kidney injury initially placed on dobutamine drip CHF team and nephrology was consulted.  Found to have LV thrombus started on heparin week later noted to have bloody bowel movement.  Underwent EGD 11/1 which was negative for bleeding started back on Coumadin awaiting INR to become therapeutic.   Assessment & Plan:   Principal Problem:   Cholangitis due to bile duct calculus with obstruction Active Problems:   Hyperlipidemia   Diabetes mellitus without complication (HCC)   CKD (chronic kidney disease), stage III (HCC)   AKI (acute kidney injury) ([place])   Acute on chronic systolic and diastolic heart failure, NYHA class 1 (HCC)   Essential hypertension   Acute renal failure (ARF) (HCC)   Abdominal pain   Acute lower UTI   Choledocholithiasis   Cardiogenic shock (HCC)   Dysphagia   Palliative care encounter   GI bleed with acute blood loss anemia, suspected upper   Acute blood loss anemia secondary to melanotic stool LA grade C esophagitis and gastric ulcer -Monitor hemoglobin at this time.  No further bleeding noted.  PPI twice daily for 2-3 months -Status post 1 dose of Feraheme 11/2 -Continue bridging from heparin to Coumadin. -Hemoglobin stable  Acute biventricular congestive heart failure with reduced ejection fraction, less than 20% -Managed by CHF team.  Appears to be euvolemic on Demadex orally.  Left ventricular thrombus -Heparin bridge to Coumadin.   Awaiting INR to become greater than 2.  INR remains 1.6  Acute kidney injury on CKD stage IIIb.  Resolved -Baseline creatinine 2.5.  Cholangitis secondary to obstructive choledocholithiasis Ileus -Improving.  Continue to monitor labs.  History of BPH with chronic indwelling Foley -Chronic Foley removed by urology 11/23.  Follow-up with neurology in 1-2 weeks. -Continue Flomax and finasteride  Constipation -Bowel regimen.  Dulcolax suppository if needed   DVT prophylaxis: Hep drip to Coumadin  Code Status: DNR Family Communication:   Disposition Plan: Home Once INR >2.0; Then Home with HH   Subjective: No complaints today.  Tells me he had a large bowel movement yesterday evening.  Review of Systems Otherwise negative except as per HPI, including: General: Denies fever, chills, night sweats or unintended weight loss. Resp: Denies cough, wheezing, shortness of breath. Cardiac: Denies chest pain, palpitations, orthopnea, paroxysmal nocturnal dyspnea. GI: Denies abdominal pain, nausea, vomiting, diarrhea or constipation GU: Denies dysuria, frequency, hesitancy or incontinence MS: Denies muscle aches, joint pain or swelling Neuro: Denies headache, neurologic deficits (focal weakness, numbness, tingling), abnormal gait Psych: Denies anxiety, depression, SI/HI/AVH Skin: Denies new rashes or lesions ID: Denies sick contacts, exotic exposures, travel  Objective: Vitals:   07/03/19 2033 07/04/19 0031 07/04/19 0421 07/04/19 0500  BP:  108/68 114/69   Pulse:  (!) 103 (!) 112   Resp: 15 (!) 23 (!) 22   Temp:  98 F (36.7 C) 97.8 F (36.6 C)   TempSrc:  Oral Oral   SpO2:  100% 100%   Weight:    74.3 kg  Height:        Intake/Output Summary (Last  24 hours) at 07/04/2019 0747 Last data filed at 07/04/2019 0433 Gross per 24 hour  Intake 180 ml  Output 1800 ml  Net -1620 ml   Filed Weights   07/02/19 0605 07/03/19 0500 07/04/19 0500  Weight: 74.3 kg 74.8 kg 74.3 kg     Examination:  General exam: Appears calm and comfortable  Respiratory system: Minimal bibasilar crackles but otherwise clear Cardiovascular system: S1 & S2 heard, RRR. No JVD, murmurs, rubs, gallops or clicks. No pedal edema. Gastrointestinal system: Abdomen is nondistended, soft and nontender. No organomegaly or masses felt. Normal bowel sounds heard. Central nervous system: Alert and oriented. No focal neurological deficits. Extremities: Symmetric 5 x 5 power. Skin: No rashes, lesions or ulcers Psychiatry: Judgement and insight appear normal. Mood & affect appropriate.     Data Reviewed:   CBC: Recent Labs  Lab 06/30/19 0428 07/01/19 0322 07/02/19 0500 07/03/19 0534 07/04/19 0500  WBC 13.0* 11.9* 12.2* 9.6 9.2  HGB 6.7* 7.3* 7.9* 7.5* 7.5*  HCT 21.3* 22.7* 25.5* 24.1* 24.6*  MCV 93.8 91.9 95.5 96.4 97.2  PLT 352 353 443* 440* 469*   Basic Metabolic Panel: Recent Labs  Lab 06/28/19 0920  06/30/19 0428 07/01/19 0322 07/02/19 0500 07/03/19 1041 07/04/19 0500  NA  --    < > 137 136 137 137 137  K  --    < > 4.1 4.2 4.1 4.0 4.5  CL  --    < > 97* 96* 97* 100 101  CO2  --    < > 29 29 29 27 28   GLUCOSE  --    < > 107* 103* 100* 141* 90  BUN  --    < > 70* 59* 52* 43* 41*  CREATININE  --    < > 2.16* 2.15* 2.24* 2.09* 2.09*  CALCIUM  --    < > 8.4* 8.4* 8.4* 8.1* 8.5*  MG 1.9  --   --   --  1.8  --   --    < > = values in this interval not displayed.   GFR: Estimated Creatinine Clearance: 32.1 mL/min (A) (by C-G formula based on SCr of 2.09 mg/dL (H)). Liver Function Tests: Recent Labs  Lab 06/28/19 0410 06/29/19 0454 06/30/19 0428 07/01/19 0322 07/02/19 0500  AST 29 30 37 39 41  ALT 22 23 27 28  32  ALKPHOS 63 57 54 58 61  BILITOT 6.7* 5.3* 4.4* 4.1* 4.0*  PROT 7.0 6.4* 6.5 6.7 7.0  ALBUMIN 2.0* 1.8* 1.7* 1.8* 1.9*   No results for input(s): LIPASE, AMYLASE in the last 168 hours. No results for input(s): AMMONIA in the last 168 hours. Coagulation  Profile: Recent Labs  Lab 06/30/19 0428 07/01/19 0322 07/02/19 0630 07/03/19 0534 07/04/19 0500  INR 1.3* 1.2 1.5* 1.6* 1.6*   Cardiac Enzymes: No results for input(s): CKTOTAL, CKMB, CKMBINDEX, TROPONINI in the last 168 hours. BNP (last 3 results) No results for input(s): PROBNP in the last 8760 hours. HbA1C: No results for input(s): HGBA1C in the last 72 hours. CBG: Recent Labs  Lab 07/01/19 0744 07/01/19 1108 07/01/19 1313 07/02/19 1214 07/02/19 1656  GLUCAP 106* 90 104* 177* 145*   Lipid Profile: No results for input(s): CHOL, HDL, LDLCALC, TRIG, CHOLHDL, LDLDIRECT in the last 72 hours. Thyroid Function Tests: No results for input(s): TSH, T4TOTAL, FREET4, T3FREE, THYROIDAB in the last 72 hours. Anemia Panel: No results for input(s): VITAMINB12, FOLATE, FERRITIN, TIBC, IRON, RETICCTPCT in the last 72 hours. Sepsis Labs:  No results for input(s): PROCALCITON, LATICACIDVEN in the last 168 hours.  No results found for this or any previous visit (from the past 240 hour(s)).       Radiology Studies: No results found.      Scheduled Meds: . sodium chloride   Intravenous Once  . Chlorhexidine Gluconate Cloth  6 each Topical Daily  . feeding supplement (ENSURE ENLIVE)  237 mL Oral BID BM  . finasteride  5 mg Oral Daily  . isosorbide-hydrALAZINE  0.5 tablet Oral TID  . multivitamin with minerals  1 tablet Oral Daily  . pantoprazole  40 mg Oral BID AC  . polycarbophil  625 mg Oral Daily  . sevelamer carbonate  800 mg Oral TID WC  . simethicone  160 mg Oral TID AC & HS  . sodium chloride flush  10-40 mL Intracatheter Q12H  . tamsulosin  0.4 mg Oral Daily  . torsemide  20 mg Oral Daily  . Warfarin - Pharmacist Dosing Inpatient   Does not apply q1800   Continuous Infusions: . heparin 1,100 Units/hr (07/04/19 0701)     LOS: 15 days   Time spent= 25 mins    Ankit Arsenio Loader, MD [facility]  If 7PM-7AM, please contact night-coverage   07/04/2019, 7:47 AM

## 2019-07-04 NOTE — Progress Notes (Signed)
ANTICOAGULATION CONSULT NOTE   Pharmacy Consult for Heparin > warfarin Indication: LV thrombus  No Known Allergies  Patient Measurements: Height: 5\' 11"  (180.3 cm) Weight: 163 lb 12.8 oz (74.3 kg) IBW/kg (Calculated) : 75.3  Heparin dosing weight: 90 kg  Vital Signs: Temp: 98 F (36.7 C) (11/04 0825) Temp Source: Oral (11/04 0825) BP: 108/73 (11/04 0825) Pulse Rate: 112 (11/04 0421)  Labs: Recent Labs    07/02/19 0500  07/02/19 0630 07/03/19 0534 07/03/19 1041  07/03/19 1645 07/04/19 0010 07/04/19 0500  HGB 7.9*  --   --  7.5*  --   --   --   --  7.5*  HCT 25.5*  --   --  24.1*  --   --   --   --  24.6*  PLT 443*  --   --  440*  --   --   --   --  450*  LABPROT  --   --  17.7* 19.0*  --   --   --   --  18.6*  INR  --   --  1.5* 1.6*  --   --   --   --  1.6*  HEPARINUNFRC  --    < > 0.41 0.85*  --    < > 0.87* 0.46 0.31  CREATININE 2.24*  --   --   --  2.09*  --   --   --  2.09*   < > = values in this interval not displayed.    Estimated Creatinine Clearance: 32.1 mL/min (A) (by C-G formula based on SCr of 2.09 mg/dL (H)).  Assessment: 78 y.o. 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 Heparin was stopped yesterday due to low Hg (Pt received 1 unit of PRBC). GI performed an upper GI endoscopy where they found no evidence of a patent sphincterotomy in the ampulla without ulceration or evidence of recent bleeding/oozing.  INR today remains at 1.6, slowly increasing  starting to bump. Hgb stable at 7.5, plt 450- stable today. Heparin level this morning came back therapeutic at 0.31, on 1100 units/hr. No overt signs of bleeding per RN. No infusion issues.   Goal of Therapy:  INR 2-3 HL 0.3-0.5 (low end of range with bleeding Monitor platelets by anticoagulation protocol: Yes   Plan:  Increase heparin infusion to 1150 units/hr to keep in goal range  Give warfarin 7.5 mg x 1 tonight Daily Pt-INR Bridge with heparin until INR therapeutic.  Monitor CBC daily Watch  for signs/symptoms of bleeding   Antonietta Jewel, PharmD, BCCCP Clinical Pharmacist  Phone: [phone]  Please check AMION for all [place] phone numbers After 10:00 PM, call [place] [phone] 07/04/2019 10:34 AM

## 2019-07-04 NOTE — Progress Notes (Signed)
Physical Therapy Treatment Patient Details Name: [name] MRN: [id number] DOB: [birth year] Today's Date: 07/04/2019    History of Present Illness Pt is a 78 y.o. M with HTN, sCHF EF 25%, CKD III baseline Cr 2, DM, PVD and alcohol use who presented 06/18/19 with 4-5 days progressive abdominal pain with vomiting.  Found to have choledocholithiasis and underwent ERCP.  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    PT Comments    Patient seen for mobility progression. Pt requires min A to stand and with LOB upon standing from EOB requiring assist to recover. Pt tolerated gait distance of 140 ft with RW and min guard/min A. Pt with c/o dizziness while mobilizing. BP in sitting end of session 108/59 (72). Continue to progress as tolerated.     Follow Up Recommendations  Home health PT;Supervision/Assistance - 24 hour     Equipment Recommendations  None recommended by PT    Recommendations for Other Services       Precautions / Restrictions Precautions Precautions: Fall Restrictions Weight Bearing Restrictions: No    Mobility  Bed Mobility Overal bed mobility: Modified Independent Bed Mobility: Supine to Sit           General bed mobility comments: use of rail and increased time/effort needed  Transfers Overall transfer level: Needs assistance Equipment used: Rolling walker (2 wheeled) Transfers: Sit to/from Stand Sit to Stand: Min assist         General transfer comment: cues for safe hand placement; assist to power up and to steady; pt with LOB and increased assist to recover upon standing from EOB   Ambulation/Gait Ambulation/Gait assistance: Min assist;Min guard Gait Distance (Feet): 140 Feet Assistive device: Rolling walker (2 wheeled) Gait Pattern/deviations: Step-through pattern;Decreased stride length;Trunk flexed Gait velocity: Decreased   General Gait Details: cues for increased stide length/cadence; pt with c/o dizziness   Stairs              Wheelchair Mobility    Modified Rankin (Stroke Patients Only)       Balance Overall balance assessment: Needs assistance Sitting-balance support: Feet supported;No upper extremity supported Sitting balance-Leahy Scale: Good     Standing balance support: During functional activity Standing balance-Leahy Scale: Poor Standing balance comment: Requires UE support for standing.                            Cognition Arousal/Alertness: Awake/alert Behavior During Therapy: WFL for tasks assessed/performed Overall Cognitive Status: Within Functional Limits for tasks assessed                                        Exercises      General Comments General comments (skin integrity, edema, etc.): BP in sitting end of session 108/59 (72)      Pertinent Vitals/Pain Pain Assessment: No/denies pain    Home Living                      Prior Function            PT Goals (current goals can now be found in the care plan section) Progress towards PT goals: Progressing toward goals    Frequency    Min 3X/week      PT Plan Current plan remains appropriate    Co-evaluation  AM-PAC PT "6 Clicks" Mobility   Outcome Measure  Help needed turning from your back to your side while in a flat bed without using bedrails?: None Help needed moving from lying on your back to sitting on the side of a flat bed without using bedrails?: None Help needed moving to and from a bed to a chair (including a wheelchair)?: A Little Help needed standing up from a chair using your arms (e.g., wheelchair or bedside chair)?: A Little Help needed to walk in hospital room?: A Little Help needed climbing 3-5 steps with a railing? : A Little 6 Click Score: 20    End of Session Equipment Utilized During Treatment: Gait belt Activity Tolerance: Patient tolerated treatment well Patient left: in chair;with call bell/phone within  reach Nurse Communication: Mobility status PT Visit Diagnosis: Other abnormalities of gait and mobility (R26.89);Muscle weakness (generalized) (M62.81)     Time: 5248-1859 PT Time Calculation (min) (ACUTE ONLY): 28 min  Charges:  $Gait Training: 23-37 mins                     Earney Navy, PTA Acute Rehabilitation Services Pager: [phone] Office: [phone]     Darliss Cheney 07/04/2019, 1:05 PM

## 2019-07-05 ENCOUNTER — Encounter (HOSPITAL_COMMUNITY): Payer: Medicare Other

## 2019-07-05 LAB — CBC
HCT: 22.9 % — ABNORMAL LOW (ref 39.0–52.0)
Hemoglobin: 7.1 g/dL — ABNORMAL LOW (ref 13.0–17.0)
MCH: 30.2 pg (ref 26.0–34.0)
MCHC: 31 g/dL (ref 30.0–36.0)
MCV: 97.4 fL (ref 80.0–100.0)
Platelets: 424 10*3/uL — ABNORMAL HIGH (ref 150–400)
RBC: 2.35 MIL/uL — ABNORMAL LOW (ref 4.22–5.81)
RDW: 19.7 % — ABNORMAL HIGH (ref 11.5–15.5)
WBC: 7.7 10*3/uL (ref 4.0–10.5)
nRBC: 0 % (ref 0.0–0.2)

## 2019-07-05 LAB — BASIC METABOLIC PANEL
Anion gap: 8 (ref 5–15)
BUN: 40 mg/dL — ABNORMAL HIGH (ref 8–23)
CO2: 27 mmol/L (ref 22–32)
Calcium: 8.3 mg/dL — ABNORMAL LOW (ref 8.9–10.3)
Chloride: 102 mmol/L (ref 98–111)
Creatinine, Ser: 2.19 mg/dL — ABNORMAL HIGH (ref 0.61–1.24)
GFR calc Af Amer: 33 mL/min — ABNORMAL LOW (ref >60)
GFR calc non Af Amer: 28 mL/min — ABNORMAL LOW (ref >60)
Glucose, Bld: 89 mg/dL (ref 70–99)
Potassium: 4.1 mmol/L (ref 3.5–5.1)
Sodium: 137 mmol/L (ref 135–145)

## 2019-07-05 LAB — HEPARIN LEVEL (UNFRACTIONATED)
Heparin Unfractionated: 0.23 IU/mL — ABNORMAL LOW (ref 0.30–0.70)
Heparin Unfractionated: 0.31 IU/mL (ref 0.30–0.70)

## 2019-07-05 LAB — COOXEMETRY PANEL
Carboxyhemoglobin: 2.8 % — ABNORMAL HIGH (ref 0.5–1.5)
Methemoglobin: 1.1 % (ref 0.0–1.5)
O2 Saturation: 73.2 %
Total hemoglobin: 7.4 g/dL — ABNORMAL LOW (ref 12.0–16.0)

## 2019-07-05 LAB — PROTIME-INR
INR: 1.7 — ABNORMAL HIGH (ref 0.8–1.2)
Prothrombin Time: 19.8 seconds — ABNORMAL HIGH (ref 11.4–15.2)

## 2019-07-05 MED ORDER — CARVEDILOL 3.125 MG PO TABS
3.1250 mg | ORAL_TABLET | Freq: Two times a day (BID) | ORAL | Status: DC
  Administered 2019-07-05 – 2019-07-09 (×9): 3.125 mg via ORAL
  Filled 2019-07-05 (×9): qty 1

## 2019-07-05 MED ORDER — WARFARIN SODIUM 7.5 MG PO TABS
7.5000 mg | ORAL_TABLET | Freq: Once | ORAL | Status: AC
  Administered 2019-07-05: 7.5 mg via ORAL
  Filled 2019-07-05: qty 1

## 2019-07-05 NOTE — Progress Notes (Signed)
CARDIAC REHAB PHASE I   PRE:  Rate/Rhythm: 104 ST    BP: sitting 95/52    SaO2: 99 RA  MODE:  Ambulation: 150 ft   POST:  Rate/Rhythm: 119 ST    BP: sitting 107/63     SaO2: 100 RA  Pt in recliner, feels fairly well. Slowly stood, min assist. Used rollator (in case he needed to sit), assist x2 for equipment. No dizziness noted. Slow and steady pace in hall. To bed after walk, VSS although HR up.  [id number]   Darrick Meigs CES, ACSM 07/05/2019 10:42 AM

## 2019-07-05 NOTE — Progress Notes (Addendum)
PROGRESS NOTE    [name]  MLY:[id number] DOB: [birth year] DOA: 06/18/2019 PCP: [name], MD   Brief Narrative:  78 year old with history of HTN, HLD, DM2, CKD stage IIIb, systolic CHF ejection fraction 25-30%, chronic Foley, alcohol use, sepsis secondary to acute cholecystitis status post IR cholecystostomy initially presented with abdominal pain diagnosed with cholelithiasis and cholangitis.  ERCP performed 10/20-stone was removed.  Hospital course complicated by acute systolic heart failure and acute kidney injury initially placed on dobutamine drip CHF team and nephrology was consulted.  Found to have LV thrombus started on heparin and  later noted to have bloody bowel movement.  Underwent EGD 11/1 which was negative for bleeding started back on Coumadin awaiting INR to become therapeutic. Cardiology is recommending d/c heparin and continuing with coumadin.   Assessment & Plan:   Principal Problem:   Cholangitis due to bile duct calculus with obstruction Active Problems:   Hyperlipidemia   Diabetes mellitus without complication (HCC)   CKD (chronic kidney disease), stage III (HCC)   AKI (acute kidney injury) ([place])   Acute on chronic systolic and diastolic heart failure, NYHA class 1 (HCC)   Essential hypertension   Acute renal failure (ARF) (HCC)   Abdominal pain   Acute lower UTI   Choledocholithiasis   Cardiogenic shock (HCC)   Dysphagia   Palliative care encounter   GI bleed with acute blood loss anemia, suspected upper   Acute blood loss anemia secondary to melanotic stool LA grade C esophagitis and gastric ulcer Continue to monitor hemoglobin  Continue with Protonix twice daily Cardio d/c heparin and c/w coumadin. Patient will be transfused with 1 unit of PRBC with diuresis Discuss with cardiology.   Acute decompensated heart failure with reduced ejection fraction.  Last echocardiogram showed EF of less than 20%.  NYHA III-IV, ACC/AHA C. Per the  guideline, patient qualifies for ARNI with Entresto   Serum creatinine today is 2.19. The [facility] and HOPE trial, which is a large ACE inhibitor trial, showed that patient with heart failure with reduced ejection fraction with EF of less than 40% and with high serum creatinine has much improved mortality and morbidity with starting ACE inhibitor than patients with normal serum creatinine.  Creatinine is a measure of risk. In the study, patients with high creatinine above 3, was started on reduced dose of ACE inhibitor.   Left ventricular thrombus Heparin bridge to Coumadin.  Awaiting INR to become greater than 2.  INR remains 1.7  Acute kidney injury on CKD stage IIIb.  Resolved Baseline creatinine 2.5.  Serum creatinine now at 2.13  Cholangitis secondary to obstructive choledocholithiasis Ileus Improving.   Continue to monitor   History of BPH with chronic indwelling Foley Chronic Foley removed by urology 11/23.  Follow-up with Urology in 1-2 weeks. Continue Flomax and finasteride  Constipation -Bowel regimen.  Dulcolax suppository if needed   DVT prophylaxis: Hep drip to Coumadin  Code Status: DNR Family Communication:None  Disposition Plan: Possible d/c home. Appointment set for his INR monitor as outpatient.    Subjective: Patient is sitting out of bed.  Not in acute distress.  Alert and oriented x3.He asked when he can be discharged home.  Review of Systems Otherwise negative except as per HPI, including: General: Denies fever, chills, night sweats or unintended weight loss. Resp: Denies cough, wheezing, shortness of breath. Cardiac: Denies chest pain, palpitations, orthopnea, paroxysmal nocturnal dyspnea. GI: Denies abdominal pain, nausea, vomiting, diarrhea or constipation GU: Denies dysuria, frequency, hesitancy or incontinence MS: Denies muscle aches, joint pain or swelling Neuro: Denies  headache, neurologic deficits (focal weakness, numbness, tingling), abnormal gait Psych: Denies anxiety or  depression Skin: Denies new rashes or lesions ID: Denies sick contacts, exotic exposures, travel  Objective: Vitals:   07/05/19 0855 07/05/19 1236 07/05/19 1257 07/05/19 1637  BP: (!) 99/58  100/61 101/63  Pulse: (!) 122  (!) 103 (!) 105  Resp: (!) 22 18 18  (!) 23  Temp:  97.8 F (36.6 C)  98.4 F (36.9 C)  TempSrc:  Oral  Oral  SpO2:    100%  Weight:      Height:        Intake/Output Summary (Last 24 hours) at 07/05/2019 1708 Last data filed at 07/05/2019 1638 Gross per 24 hour  Intake 480 ml  Output 1250 ml  Net -770 ml   Filed Weights   07/03/19 0500 07/04/19 0500 07/05/19 0410  Weight: 74.8 kg 74.3 kg 74.4 kg    Examination:  General exam: Appears calm and comfortable  Respiratory system: Minimal bibasilar crackles but otherwise clear Cardiovascular system: S1 & S2 heard, RRR. No JVD, murmurs, rubs, gallops or clicks. No pedal edema. Gastrointestinal system: Abdomen is nondistended, soft and nontender. No organomegaly or masses felt. Normal bowel sounds heard. Central nervous system: Alert and oriented. No focal neurological deficits. Extremities: Symmetric 5 x 5 power. Skin: No rashes, lesions or ulcers Psychiatry: Judgement and insight appear normal. Mood & affect appropriate.     Data Reviewed:   CBC: Recent Labs  Lab 07/01/19 0322 07/02/19 0500 07/03/19 0534 07/04/19 0500 07/05/19 0500  WBC 11.9* 12.2* 9.6 9.2 7.7  HGB 7.3* 7.9* 7.5* 7.5* 7.1*  HCT 22.7* 25.5* 24.1* 24.6* 22.9*  MCV 91.9 95.5 96.4 97.2 97.4  PLT 353 443* 440* 450* 283*   Basic Metabolic Panel: Recent Labs  Lab 07/01/19 0322 07/02/19 0500 07/03/19 1041 07/04/19 0500 07/05/19 0500  NA 136 137 137 137 137  K 4.2 4.1 4.0 4.5 4.1  CL 96* 97* 100 101 102  CO2 29 29 27 28 27   GLUCOSE 103* 100* 141* 90 89  BUN 59* 52* 43* 41* 40*  CREATININE 2.15* 2.24* 2.09* 2.09* 2.19*   CALCIUM 8.4* 8.4* 8.1* 8.5* 8.3*  MG  --  1.8  --   --   --    GFR: Estimated Creatinine Clearance: 30.7 mL/min (A) (by C-G formula based on SCr of 2.19 mg/dL (H)). Liver Function Tests: Recent Labs  Lab 06/29/19 0454 06/30/19 0428 07/01/19 0322 07/02/19 0500  AST 30 37 39 41  ALT 23 27 28  32  ALKPHOS 57 54 58 61  BILITOT 5.3* 4.4* 4.1* 4.0*  PROT 6.4* 6.5 6.7 7.0  ALBUMIN 1.8* 1.7* 1.8* 1.9*   No results for input(s): LIPASE, AMYLASE in the last 168 hours. No results for input(s): AMMONIA in the last 168 hours. Coagulation Profile: Recent Labs  Lab 07/01/19 0322 07/02/19 0630 07/03/19 0534 07/04/19 0500 07/05/19 0500  INR 1.2 1.5* 1.6* 1.6* 1.7*   Cardiac Enzymes: No results for input(s): CKTOTAL, CKMB, CKMBINDEX, TROPONINI in the last 168 hours. BNP (last 3 results) No results for input(s): PROBNP in the last 8760 hours. HbA1C:  No results for input(s): HGBA1C in the last 72 hours. CBG: Recent Labs  Lab 07/01/19 0744 07/01/19 1108 07/01/19 1313 07/02/19 1214 07/02/19 1656  GLUCAP 106* 90 104* 177* 145*   Lipid Profile: No results for input(s): CHOL, HDL, LDLCALC, TRIG, CHOLHDL, LDLDIRECT in the last 72 hours. Thyroid Function Tests: No results for input(s): TSH, T4TOTAL, FREET4, T3FREE, THYROIDAB in the last 72 hours. Anemia Panel: No results for input(s): VITAMINB12, FOLATE, FERRITIN, TIBC, IRON, RETICCTPCT in the last 72 hours. Sepsis Labs: No results for input(s): PROCALCITON, LATICACIDVEN in the last 168 hours.  No results found for this or any previous visit (from the past 240 hour(s)).       Radiology Studies: No results found.      Scheduled Meds: . sodium chloride   Intravenous Once  . carvedilol  3.125 mg Oral BID WC  . Chlorhexidine Gluconate Cloth  6 each Topical Daily  . feeding supplement (ENSURE ENLIVE)  237 mL Oral BID BM  . finasteride  5 mg Oral Daily  . isosorbide-hydrALAZINE  0.5 tablet Oral TID  . multivitamin with  minerals  1 tablet Oral Daily  . pantoprazole  40 mg Oral BID AC  . polycarbophil  625 mg Oral Daily  . sevelamer carbonate  800 mg Oral TID WC  . simethicone  160 mg Oral TID AC & HS  . sodium chloride flush  10-40 mL Intracatheter Q12H  . tamsulosin  0.4 mg Oral Daily  . Warfarin - Pharmacist Dosing Inpatient   Does not apply q1800   Continuous Infusions: . heparin 1,250 Units/hr (07/05/19 0900)     LOS: 16 days   Total time spent on this encounter is 35 minutes.      Elie Confer, MD [facility] [phone]  If 7PM-7AM, please contact night-coverage  07/05/2019, 5:08 PM

## 2019-07-05 NOTE — Progress Notes (Addendum)
ANTICOAGULATION CONSULT NOTE   Pharmacy Consult for Heparin > warfarin Indication: LV thrombus  No Known Allergies  Patient Measurements: Height: 5\' 11"  (180.3 cm) Weight: 164 lb 0.4 oz (74.4 kg) IBW/kg (Calculated) : 75.3  Heparin dosing weight: 74.4 kg  Vital Signs: Temp: 98.4 F (36.9 C) (11/05 1637) Temp Source: Oral (11/05 1637) BP: 101/63 (11/05 1637) Pulse Rate: 105 (11/05 1637)  Labs: Recent Labs    07/03/19 0534 07/03/19 1041  07/04/19 0500 07/05/19 0500 07/05/19 1700  HGB 7.5*  --   --  7.5* 7.1*  --   HCT 24.1*  --   --  24.6* 22.9*  --   PLT 440*  --   --  450* 424*  --   LABPROT 19.0*  --   --  18.6* 19.8*  --   INR 1.6*  --   --  1.6* 1.7*  --   HEPARINUNFRC 0.85*  --    < > 0.31 0.23* 0.31  CREATININE  --  2.09*  --  2.09* 2.19*  --    < > = values in this interval not displayed.    Estimated Creatinine Clearance: 30.7 mL/min (A) (by C-G formula based on SCr of 2.19 mg/dL (H)).  Assessment: 78 y.o. 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 Heparin was stopped yesterday due to low Hgb (Pt received 1 unit of PRBCs). GI performed an upper GI endoscopy where they found no evidence of a patent sphincterotomy in the ampulla without ulceration or evidence of recent bleeding/oozing.  INR today remains at 1.7, slowly increasing  starting to bump. Hgb 7.7, plt 424. Heparin level this morning came back subtherapeutic at 0.23, on 1150 units/hr. No overt signs of bleeding per RN. No infusion issues.   Heparin infusion was increased to 1250 units/hr earlier today. Heparin level drawn 8 hrs after increasing heparin infusion rate was 0.31 units/ml, which at the low end of the goal range for this patient. Per RN, no issues with IV or bleeding observed.  Goal of Therapy:  INR 2-3 Heparin level 0.3-0.5 (low end of range with bleeding) Monitor platelets by anticoagulation protocol: Yes   Plan:  Increase heparin infusion to 1300 units/hr  Check 8-hr heparin  level Give warfarin 7.5 mg x 1 tonight Daily Pt-INR Bridge with heparin until INR therapeutic.  Monitor CBC, heparin level daily Watch for signs/symptoms of bleeding  Gillermina Hu, PharmD, BCPS, [facility] Clinical Pharmacist 07/05/2019 6:36 PM

## 2019-07-05 NOTE — Progress Notes (Signed)
All care for CVP line is done by Guadelupe Sabin.N.

## 2019-07-05 NOTE — Progress Notes (Signed)
ANTICOAGULATION CONSULT NOTE   Pharmacy Consult for Heparin > warfarin Indication: LV thrombus  No Known Allergies  Patient Measurements: Height: 5\' 11"  (180.3 cm) Weight: 164 lb 0.4 oz (74.4 kg) IBW/kg (Calculated) : 75.3  Heparin dosing weight: 90 kg  Vital Signs: Temp: 98.6 F (37 C) (11/05 0532) Temp Source: Oral (11/05 0532) BP: 108/66 (11/05 0532) Pulse Rate: 108 (11/05 0532)  Labs: Recent Labs    07/03/19 0534 07/03/19 1041  07/04/19 0010 07/04/19 0500 07/05/19 0500  HGB 7.5*  --   --   --  7.5* 7.1*  HCT 24.1*  --   --   --  24.6* 22.9*  PLT 440*  --   --   --  450* 424*  LABPROT 19.0*  --   --   --  18.6* 19.8*  INR 1.6*  --   --   --  1.6* 1.7*  HEPARINUNFRC 0.85*  --    < > 0.46 0.31 0.23*  CREATININE  --  2.09*  --   --  2.09* 2.19*   < > = values in this interval not displayed.    Estimated Creatinine Clearance: 30.7 mL/min (A) (by C-G formula based on SCr of 2.19 mg/dL (H)).  Assessment: 78 y.o. 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 Heparin was stopped yesterday due to low Hg (Pt received 1 unit of PRBC). GI performed an upper GI endoscopy where they found no evidence of a patent sphincterotomy in the ampulla without ulceration or evidence of recent bleeding/oozing.  INR today remains at 1.7, slowly increasing  starting to bump. Hgb stable at 7.1, plt 424- stable today. Heparin level this morning came back subtherapeutic at 0.23, on 1150 units/hr. No overt signs of bleeding per RN. No infusion issues.   Goal of Therapy:  INR 2-3 HL 0.3-0.5 (low end of range with bleeding Monitor platelets by anticoagulation protocol: Yes   Plan:  Increase heparin infusion to 1250 units/hr  Give warfarin 7.5 mg x 1 tonight Daily Pt-INR Bridge with heparin until INR therapeutic.  Monitor CBC daily Watch for signs/symptoms of bleeding   Antonietta Jewel, PharmD, BCCCP Clinical Pharmacist  Phone: [phone]  Please check AMION for all [place] phone  numbers After 10:00 PM, call [place] [phone] 07/05/2019 8:19 AM

## 2019-07-05 NOTE — Progress Notes (Addendum)
Patient ID: [name], male   DOB: June 25, 1941, 78 y.o.   MRN: [id number]    Advanced Heart Failure Rounding Note   Subjective:    S/P scope-->  No gross lesions in esophagus proximally. LA Grade C esophagitis distally. 3 cm hiatal hernia.Erythematous mucosa in the gastric body and antrum. - Non-bleeding gastric ulcers with a clean ulcer base ([place] Class III).  Hgb 7.3 => 7.9=>7.5 =>7.5 =>7.1   CO-OX 73%     Remains on heparin drip. No obvious source of bleeding.   Denies SOB. Says he has some dizziness when he stands up.     Objective:   Weight Range:  Vital Signs:   Temp:  [97.8 F (36.6 C)-98.9 F (37.2 C)] 98.6 F (37 C) (11/05 0532) Pulse Rate:  [107-109] 108 (11/05 0532) Resp:  [18-23] 18 (11/05 0532) BP: (93-112)/(51-73) 108/66 (11/05 0532) SpO2:  [99 %-100 %] 100 % (11/05 0532) Weight:  [74.4 kg] 74.4 kg (11/05 0410) Last BM Date: 07/03/19  Weight change: Filed Weights   07/03/19 0500 07/04/19 0500 07/05/19 0410  Weight: 74.8 kg 74.3 kg 74.4 kg    Intake/Output:   Intake/Output Summary (Last 24 hours) at 07/05/2019 0808 Last data filed at 07/05/2019 0807 Gross per 24 hour  Intake 360 ml  Output 2000 ml  Net -1640 ml   CVP 4-5  General:  Well appearing. No resp difficulty HEENT: normal Neck: supple. no JVD. Carotids 2+ bilat; no bruits. No lymphadenopathy or thryomegaly appreciated. Cor: PMI nondisplaced. Regular rate & rhythm. No rubs, gallops or murmurs. Lungs: clear Abdomen: soft, nontender, nondistended. No hepatosplenomegaly. No bruits or masses. Good bowel sounds. Extremities: no cyanosis, clubbing, rash, edema. RUE PICC  Neuro: alert & orientedx3, cranial nerves grossly intact. moves all 4 extremities w/o difficulty. Affect pleasant   Telemetry: Sinsu Tach 100s. Personally reviewed.  Labs: Basic Metabolic Panel: Recent Labs  Lab 06/28/19 0920  07/01/19 0322 07/02/19 0500 07/03/19 1041 07/04/19 0500 07/05/19 0500  NA  --    <  > 136 137 137 137 137  K  --    < > 4.2 4.1 4.0 4.5 4.1  CL  --    < > 96* 97* 100 101 102  CO2  --    < > 29 29 27 28 27   GLUCOSE  --    < > 103* 100* 141* 90 89  BUN  --    < > 59* 52* 43* 41* 40*  CREATININE  --    < > 2.15* 2.24* 2.09* 2.09* 2.19*  CALCIUM  --    < > 8.4* 8.4* 8.1* 8.5* 8.3*  MG 1.9  --   --  1.8  --   --   --    < > = values in this interval not displayed.    Liver Function Tests: Recent Labs  Lab 06/29/19 0454 06/30/19 0428 07/01/19 0322 07/02/19 0500  AST 30 37 39 41  ALT 23 27 28  32  ALKPHOS 57 54 58 61  BILITOT 5.3* 4.4* 4.1* 4.0*  PROT 6.4* 6.5 6.7 7.0  ALBUMIN 1.8* 1.7* 1.8* 1.9*   No results for input(s): LIPASE, AMYLASE in the last 168 hours. No results for input(s): AMMONIA in the last 168 hours.  CBC: Recent Labs  Lab 07/01/19 0322 07/02/19 0500 07/03/19 0534 07/04/19 0500 07/05/19 0500  WBC 11.9* 12.2* 9.6 9.2 7.7  HGB 7.3* 7.9* 7.5* 7.5* 7.1*  HCT 22.7* 25.5* 24.1* 24.6* 22.9*  MCV 91.9 95.5 96.4  97.2 97.4  PLT 353 443* 440* 450* 424*    Cardiac Enzymes: No results for input(s): CKTOTAL, CKMB, CKMBINDEX, TROPONINI in the last 168 hours.  BNP: BNP (last 3 results) Recent Labs    08/16/18 0940 02/04/19 0906 06/18/19 2109  BNP 1,774.3* >4,500.0* 2,536.0*    ProBNP (last 3 results) No results for input(s): PROBNP in the last 8760 hours.    Other results:  Imaging: No results found.   Medications:     Scheduled Medications: . sodium chloride   Intravenous Once  . Chlorhexidine Gluconate Cloth  6 each Topical Daily  . feeding supplement (ENSURE ENLIVE)  237 mL Oral BID BM  . finasteride  5 mg Oral Daily  . isosorbide-hydrALAZINE  0.5 tablet Oral TID  . multivitamin with minerals  1 tablet Oral Daily  . pantoprazole  40 mg Oral BID AC  . polycarbophil  625 mg Oral Daily  . sevelamer carbonate  800 mg Oral TID WC  . simethicone  160 mg Oral TID AC & HS  . sodium chloride flush  10-40 mL Intracatheter Q12H  .  tamsulosin  0.4 mg Oral Daily  . torsemide  20 mg Oral Daily  . Warfarin - Pharmacist Dosing Inpatient   Does not apply q1800    Infusions: . heparin 1,150 Units/hr (07/05/19 0515)    PRN Medications: alum & mag hydroxide-simeth, ondansetron (ZOFRAN) IV, polyethylene glycol, sodium chloride flush   Assessment/Plan:   1. 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 Diureses and renal function improved w/ inotropic therapy.  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 Previously declined cath. He does, of note, have anterior Qs on ECG.  Not a LHC candidate due to baseline renal function. Echo this admission showed EF < 20%, severe LV dilation, small LV apical thrombus, severely dilated and dysfunctional RV with flattened septum. Dobutamine started on 10/23 and discontinued 10/28.  - CO-OX stable 73% - CVP 4-5. Having some dizziness when standing. Check orthostatics. Hold torsemide.  - Add low dose carvedilol 3.125 mg twice a day.  Creatinine stable 2.2  - No ARB/ARNI or digoxin due to renal function.  - Continue low dose Bidil 1/2 tab tid.     2. AKI: On CKD stage 3.  Initial creatinine 2.8, peaked 5.16.  Stable now at 2.1. Suspect cardiorenal syndrome likely with ATN.  Baseline creatinine was 1.5 in 1/20 and 2.5 in 6/20. Dialysis would be very difficult for him with severe biventricular failure.  3. LV thrombus: warfarin started 10/27, INR 1.7 - Back on heparin gtt for bridge until INR reaches 2.0 - Continue warfarin. Ok with GI   4. Ascending cholangitis: S/p ERCP and stone removal. Abdominal US 10/30 did not show acute cholecystitis. He is off antibiotics.  5. Hyperbilirubinemia: Likely due to combination of CBD obstruction (now relieved) and RV failure with hepatic congestion/cirrhosis.  6. Hypokalemia: Resolved. 7. Anemia:Had dark stool 10/31, given 1 unit PRBCs 10/31 with  hgb down to 6.7.  Hgb 7.1 today. .Will likely need blood again.   - EGD negative for active bleeding. Non-bleeding ulcers noted.  - Received  IV PPI x 48 hours. Now on oral PPI twice a day. Also with recommendations for feraheme.  - Received Feraheme 07/02/19  8. NSVT: In the setting of severe LV dysfunction. Keep K and Mg in normal range.  -Received Mag on 07/02/19  K stable.   Amy  Clegg NP-C  07/05/2019 8:08 AM   Patient seen with NP, agree with the above note.   Went for walk, feels ok.  CVP 4-5, co-ox 73%.  SBP 100s generally.  INR 1.7 today with creatinine 2.19.   General: NAD Neck: No JVD, no thyromegaly or thyroid nodule.  Lungs: Clear to auscultation bilaterally with normal respiratory effort. CV: Lateral PMI.  Heart regular S1/S2, no S3/S4, no murmur.  No peripheral edema.   Abdomen: Soft, nontender, no hepatosplenomegaly, no distention.  Skin: Intact without lesions or rashes.  Neurologic: Alert and oriented x 3.  Psych: Normal affect. Extremities: No clubbing or cyanosis.  HEENT: Normal.   No overt bleeding but hgb lower today at 7.1.  I will give him 1 unit PRBCs.   CVP low, creatinine slightly higher.  Good co-ox.  - Hold torsemide today.  - Agree with low dose Coreg.  - Continue Bidil 1/2 tab tid.   INR 1.7 today, he remains on heparin gtt and warfarin, pending therapeutic INR for LV thrombus.   Loralie Champagne 07/05/2019 11:49 AM

## 2019-07-06 LAB — CBC
HCT: 22.2 % — ABNORMAL LOW (ref 39.0–52.0)
HCT: 25.9 % — ABNORMAL LOW (ref 39.0–52.0)
Hemoglobin: 7.1 g/dL — ABNORMAL LOW (ref 13.0–17.0)
Hemoglobin: 8.1 g/dL — ABNORMAL LOW (ref 13.0–17.0)
MCH: 30.3 pg (ref 26.0–34.0)
MCH: 30.6 pg (ref 26.0–34.0)
MCHC: 32 g/dL (ref 30.0–36.0)
MCHC: 31.3 g/dL (ref 30.0–36.0)
MCV: 94.9 fL (ref 80.0–100.0)
MCV: 97.7 fL (ref 80.0–100.0)
Platelets: 411 10*3/uL — ABNORMAL HIGH (ref 150–400)
Platelets: 401 10*3/uL — ABNORMAL HIGH (ref 150–400)
RBC: 2.34 MIL/uL — ABNORMAL LOW (ref 4.22–5.81)
RBC: 2.65 MIL/uL — ABNORMAL LOW (ref 4.22–5.81)
RDW: 19.2 % — ABNORMAL HIGH (ref 11.5–15.5)
RDW: 18.3 % — ABNORMAL HIGH (ref 11.5–15.5)
WBC: 7.1 10*3/uL (ref 4.0–10.5)
WBC: 6.9 10*3/uL (ref 4.0–10.5)
nRBC: 0 % (ref 0.0–0.2)
nRBC: 0 % (ref 0.0–0.2)

## 2019-07-06 LAB — BASIC METABOLIC PANEL
Anion gap: 7 (ref 5–15)
BUN: 39 mg/dL — ABNORMAL HIGH (ref 8–23)
CO2: 25 mmol/L (ref 22–32)
Calcium: 8.2 mg/dL — ABNORMAL LOW (ref 8.9–10.3)
Chloride: 102 mmol/L (ref 98–111)
Creatinine, Ser: 2.13 mg/dL — ABNORMAL HIGH (ref 0.61–1.24)
GFR calc Af Amer: 34 mL/min — ABNORMAL LOW (ref >60)
GFR calc non Af Amer: 29 mL/min — ABNORMAL LOW (ref >60)
Glucose, Bld: 94 mg/dL (ref 70–99)
Potassium: 4.1 mmol/L (ref 3.5–5.1)
Sodium: 134 mmol/L — ABNORMAL LOW (ref 135–145)

## 2019-07-06 LAB — PREPARE RBC (CROSSMATCH)

## 2019-07-06 LAB — MAGNESIUM: Magnesium: 1.8 mg/dL (ref 1.7–2.4)

## 2019-07-06 LAB — PROTIME-INR
INR: 1.9 — ABNORMAL HIGH (ref 0.8–1.2)
Prothrombin Time: 21.1 seconds — ABNORMAL HIGH (ref 11.4–15.2)

## 2019-07-06 LAB — PHOSPHORUS: Phosphorus: 3.2 mg/dL (ref 2.5–4.6)

## 2019-07-06 LAB — HEPARIN LEVEL (UNFRACTIONATED): Heparin Unfractionated: 0.48 IU/mL (ref 0.30–0.70)

## 2019-07-06 MED ORDER — SODIUM CHLORIDE 0.9% IV SOLUTION: Freq: Once | INTRAVENOUS | Status: AC

## 2019-07-06 MED ORDER — WARFARIN SODIUM 7.5 MG PO TABS
7.5000 mg | ORAL_TABLET | Freq: Once | ORAL | Status: AC
  Administered 2019-07-06: 7.5 mg via ORAL
  Filled 2019-07-06: qty 1

## 2019-07-06 MED ORDER — TORSEMIDE 20 MG PO TABS
20.0000 mg | ORAL_TABLET | Freq: Every day | ORAL | Status: DC
  Administered 2019-07-06 – 2019-07-07 (×2): 20 mg via ORAL
  Filled 2019-07-06 (×2): qty 1

## 2019-07-06 NOTE — Progress Notes (Signed)
CARDIAC REHAB PHASE I   PRE:  Rate/Rhythm: 106 ST    BP: sitting 100/63    SaO2: 100 RA  MODE:  Ambulation: 250 ft   POST:  Rate/Rhythm: 120 ST with 1 PVC    BP: sitting 110/55     SaO2: 100 RA  Pt stood independently from low surface. Used rollator, assist x2 standby to walk in hall. Quick pace today, felt well. Also increased distance without rest. No c/o, to recliner. Thankful for walk. [place], ACSM 07/06/2019 10:02 AM

## 2019-07-06 NOTE — Progress Notes (Signed)
ANTICOAGULATION CONSULT NOTE   Pharmacy Consult for Heparin > warfarin Indication: LV thrombus  No Known Allergies  Patient Measurements: Height: 5\' 11"  (180.3 cm) Weight: 165 lb 12.6 oz (75.2 kg) IBW/kg (Calculated) : 75.3  Heparin dosing weight: 74.4 kg  Vital Signs: Temp: 97.9 F (36.6 C) (11/06 0801) Temp Source: Oral (11/06 0801) BP: 100/63 (11/06 0922) Pulse Rate: 102 (11/06 0922)  Labs: Recent Labs    07/04/19 0500 07/05/19 0500 07/05/19 1700 07/06/19 0309  HGB 7.5* 7.1*  --  7.1*  HCT 24.6* 22.9*  --  22.2*  PLT 450* 424*  --  411*  LABPROT 18.6* 19.8*  --  21.1*  INR 1.6* 1.7*  --  1.9*  HEPARINUNFRC 0.31 0.23* 0.31 0.48  CREATININE 2.09* 2.19*  --  2.13*    Estimated Creatinine Clearance: 31.9 mL/min (A) (by C-G formula based on SCr of 2.13 mg/dL (H)).  Assessment: 78 y.o. 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 Heparin was stopped yesterday due to low Hgb (Pt received 1 unit of PRBCs). GI performed an upper GI endoscopy where they found no evidence of a patent sphincterotomy in the ampulla without ulceration or evidence of recent bleeding/oozing.  INR today 1.9 slowly increasing. Hgb 7.1 down but remains in low 7s no overt bleeding, plt 424. Heparin level this morning 0.48 therapeutic on 1300 units/hr.   Goal of Therapy:  INR 2-3 Heparin level 0.3-0.5 (low end of range with bleeding) Monitor platelets by anticoagulation protocol: Yes   Plan:  Decrease heparin infusion to 1250 units/hr  Give warfarin 7.5 mg x 1 tonight Daily Pt-INR Bridge with heparin until INR therapeutic.  Monitor CBC, heparin level daily Watch for signs/symptoms of bleeding  Bonnita Nasuti Pharm.D. CPP, BCPS Clinical Pharmacist [phone] 07/06/2019 10:28 AM

## 2019-07-06 NOTE — Progress Notes (Signed)
PROGRESS NOTE    [name]  WNU:[id number] DOB: 1941/04/08 DOA: 06/18/2019 PCP: [name], MD   Brief Narrative:  78 year old with history of HTN, HLD, DM2, CKD stage IIIb, systolic CHF ejection fraction 25-30%, chronic Foley, alcohol use, sepsis secondary to acute cholecystitis status post IR cholecystostomy initially presented with abdominal pain diagnosed with cholelithiasis and cholangitis.  ERCP performed 10/20-stone was removed.  Hospital course complicated by acute systolic heart failure and acute kidney injury initially placed on dobutamine drip CHF team and nephrology was consulted.  Found to have LV thrombus started on heparin and  later noted to have bloody bowel movement.  Underwent EGD 11/1 which was negative for bleeding started back on Coumadin.  INR is subtherapeutic. Cardiology is recommending d/c heparin and continuing with coumadin. Hemoglobin is 7.1 today.  I will transfuse with 1 unit of packed red blood cell with diuresis.  Cardiology okay with restarting torsemide. Will  give 20 mg of torsemide prior to blood transfusion.   Assessment & Plan:   Principal Problem:   Cholangitis due to bile duct calculus with obstruction Active Problems:   Hyperlipidemia   Diabetes mellitus without complication (HCC)   CKD (chronic kidney disease), stage III (HCC)   AKI (acute kidney injury) ([place])   Acute on chronic systolic and diastolic heart failure, NYHA class 1 (HCC)   Essential hypertension   Acute renal failure (ARF) (HCC)   Abdominal pain   Acute lower UTI   Choledocholithiasis   Cardiogenic shock (HCC)   Dysphagia   Palliative care encounter   GI bleed with acute blood loss anemia, suspected upper   Acute blood loss anemia secondary to melanotic stool LA grade C esophagitis and gastric ulcer Continue to monitor hemoglobin  Continue with Protonix twice daily Cardio d/c heparin and c/w coumadin. Patient will be transfused with 1 unit of PRBC with  diuresis Discussed with cardiology.   Acute decompensated heart failure with reduced ejection fraction.  Last echocardiogram showed EF of less than 20%.  NYHA III-IV, ACC/AHA C. Per the guideline, patient qualifies for ARNI with Entresto   Serum creatinine today is 2.19. The [facility] and HOPE trial, which is a large ACE inhibitor trial, showed that patient with heart failure with reduced ejection fraction with EF of less than 40% and with high serum creatinine has much improved mortality and morbidity with starting ACE inhibitor than patients with normal serum creatinine.  Creatinine is a measure of risk. In the study, patients with high creatinine above 3, was started on reduced dose of ACE inhibitor.   Left ventricular thrombus Heparin bridge to Coumadin.  Awaiting INR to become greater than 2.  INR remains 1.7  Acute kidney injury on CKD stage IIIb.  Resolved Baseline creatinine 2.5.  Serum creatinine now at 2.13  Cholangitis secondary to obstructive choledocholithiasis Ileus Improving.   Continue to monitor   History of BPH with chronic indwelling Foley Chronic Foley removed by urology 11/23.  Follow-up with Urology in 1-2 weeks. Continue Flomax and finasteride  Constipation -Bowel regimen.  Dulcolax suppository if needed   DVT prophylaxis: Hep drip to Coumadin  Code Status: DNR Family Communication:None  Disposition Plan: Possible d/c home. Appointment set for his INR monitor as outpatient.    Subjective: Patient is sitting out of bed.  Not in acute distress.  Alert and oriented x3.He asked when he can be discharged home.  Review of Systems Otherwise negative except as per HPI, including: General: Denies fever, chills, night sweats or unintended weight loss. Resp: Denies cough, wheezing, shortness of breath. Cardiac: Denies chest pain, palpitations, orthopnea, paroxysmal nocturnal dyspnea. GI:  Denies abdominal pain, nausea, vomiting, diarrhea or constipation GU: Denies dysuria, frequency, hesitancy or incontinence MS: Denies muscle aches, joint pain or swelling Neuro: Denies headache, neurologic deficits (focal weakness, numbness, tingling), abnormal gait Psych: Denies anxiety or  depression Skin: Denies new rashes or lesions ID: Denies sick contacts, exotic exposures, travel  Objective: Vitals:   07/06/19 1100 07/06/19 1144 07/06/19 1300 07/06/19 1306  BP:  (!) 89/59 98/61   Pulse:  95 100 99  Resp: (!) 21 (!) 22 (!) 26 (!) 22  Temp:  (!) 97.5 F (36.4 C)    TempSrc:  Oral    SpO2: 100% 100% 100% 97%  Weight:      Height:        Intake/Output Summary (Last 24 hours) at 07/06/2019 1334 Last data filed at 07/06/2019 0939 Gross per 24 hour  Intake 610 ml  Output 1050 ml  Net -440 ml   Filed Weights   07/04/19 0500 07/05/19 0410 07/06/19 0402  Weight: 74.3 kg 74.4 kg 75.2 kg    Examination:  General exam: Appears calm and comfortable  Respiratory system: Minimal bibasilar crackles but otherwise clear Cardiovascular system: S1 & S2 heard, RRR. No JVD, murmurs, rubs, gallops or clicks. No pedal edema. Gastrointestinal system: Abdomen is nondistended, soft and nontender. No organomegaly or masses felt. Normal bowel sounds heard. Central nervous system: Alert and oriented. No focal neurological deficits. Extremities: Symmetric 5 x 5 power. Skin: No rashes, lesions or ulcers Psychiatry: Judgement and insight appear normal. Mood & affect appropriate.     Data Reviewed:   CBC: Recent Labs  Lab 07/02/19 0500 07/03/19 0534 07/04/19 0500 07/05/19 0500 07/06/19 0309  WBC 12.2* 9.6 9.2 7.7 7.1  HGB 7.9* 7.5* 7.5* 7.1* 7.1*  HCT 25.5* 24.1* 24.6* 22.9* 22.2*  MCV 95.5 96.4 97.2 97.4 94.9  PLT 443* 440* 450* 424* 710*   Basic Metabolic Panel: Recent Labs  Lab 07/02/19 0500 07/03/19 1041 07/04/19 0500 07/05/19 0500 07/06/19 0309  NA 137 137 137 137 134*   K 4.1 4.0 4.5 4.1 4.1  CL 97* 100 101 102 102  CO2 29 27 28 27 25   GLUCOSE 100* 141* 90 89 94  BUN 52* 43* 41* 40* 39*  CREATININE 2.24* 2.09* 2.09* 2.19* 2.13*  CALCIUM 8.4* 8.1* 8.5* 8.3* 8.2*  MG 1.8  --   --   --  1.8  PHOS  --   --   --   --  3.2   GFR: Estimated Creatinine Clearance: 31.9 mL/min (A) (by C-G formula based on SCr of 2.13 mg/dL (H)). Liver Function Tests: Recent Labs  Lab 06/30/19 0428 07/01/19 0322 07/02/19 0500  AST 37 39 41  ALT 27 28 32  ALKPHOS 54 58 61  BILITOT 4.4* 4.1* 4.0*  PROT 6.5 6.7 7.0  ALBUMIN 1.7* 1.8* 1.9*   No results for input(s): LIPASE, AMYLASE in the last 168 hours. No results for input(s): AMMONIA in the last 168 hours. Coagulation Profile: Recent Labs  Lab 07/02/19 0630 07/03/19 0534 07/04/19 0500 07/05/19 0500 07/06/19 0309  INR 1.5* 1.6* 1.6* 1.7* 1.9*   Cardiac Enzymes: No results for input(s): CKTOTAL, CKMB, CKMBINDEX, TROPONINI in the last 168 hours. BNP (last 3 results) No results for input(s): PROBNP in the last 8760  hours. HbA1C: No results for input(s): HGBA1C in the last 72 hours. CBG: Recent Labs  Lab 07/01/19 0744 07/01/19 1108 07/01/19 1313 07/02/19 1214 07/02/19 1656  GLUCAP 106* 90 104* 177* 145*   Lipid Profile: No results for input(s): CHOL, HDL, LDLCALC, TRIG, CHOLHDL, LDLDIRECT in the last 72 hours. Thyroid Function Tests: No results for input(s): TSH, T4TOTAL, FREET4, T3FREE, THYROIDAB in the last 72 hours. Anemia Panel: No results for input(s): VITAMINB12, FOLATE, FERRITIN, TIBC, IRON, RETICCTPCT in the last 72 hours. Sepsis Labs: No results for input(s): PROCALCITON, LATICACIDVEN in the last 168 hours.  No results found for this or any previous visit (from the past 240 hour(s)).       Radiology Studies: No results found.      Scheduled Meds: . sodium chloride   Intravenous Once  . sodium chloride   Intravenous Once  . carvedilol  3.125 mg Oral BID WC  . Chlorhexidine  Gluconate Cloth  6 each Topical Daily  . feeding supplement (ENSURE ENLIVE)  237 mL Oral BID BM  . finasteride  5 mg Oral Daily  . isosorbide-hydrALAZINE  0.5 tablet Oral TID  . multivitamin with minerals  1 tablet Oral Daily  . pantoprazole  40 mg Oral BID AC  . polycarbophil  625 mg Oral Daily  . sevelamer carbonate  800 mg Oral TID WC  . simethicone  160 mg Oral TID AC & HS  . sodium chloride flush  10-40 mL Intracatheter Q12H  . tamsulosin  0.4 mg Oral Daily  . warfarin  7.5 mg Oral ONCE-1800  . Warfarin - Pharmacist Dosing Inpatient   Does not apply q1800   Continuous Infusions:    LOS: 17 days   Total time spent on this encounter is 35 minutes.      Elie Confer, MD [facility] [phone]  If 7PM-7AM, please contact night-coverage  07/06/2019, 1:34 PM

## 2019-07-06 NOTE — Progress Notes (Signed)
Patient in bed and had a short run of V-Tach 14 beats with no complaints then back to S.R. Patient has history of doing this on this adm. Cont. To monitor patient and rhythm.

## 2019-07-06 NOTE — Care Management Important Message (Signed)
Important Message  Patient Details  Name: [name] MRN: [id number] Date of Birth: June 29, 1941   Medicare Important Message Given:  Yes     [name] 07/06/2019, 2:48 PM

## 2019-07-06 NOTE — Progress Notes (Signed)
Physical Therapy Treatment Patient Details Name: [name] MRN: [id number] DOB: December 22, 1940 Today's Date: 07/06/2019    History of Present Illness Pt is a 78 y.o. M with HTN, sCHF EF 25%, CKD III baseline Cr 2, DM, PVD and alcohol use who presented 06/18/19 with 4-5 days progressive abdominal pain with vomiting.  Found to have choledocholithiasis and underwent ERCP.  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    PT Comments    Pt demonstrates improved ambulation quality and tolerance, now at a supervision level for all functional mobility. Pt does continue to require UE support for ambulation due to balance deficits and may benefit from gait training without an assistive device during next session. PT encourages patient to continue ambulating multiple times per day for the remainder of hospitalization.   Follow Up Recommendations  Home health PT;Supervision/Assistance - 24 hour     Equipment Recommendations  None recommended by PT    Recommendations for Other Services       Precautions / Restrictions Precautions Precautions: Fall Restrictions Weight Bearing Restrictions: No    Mobility  Bed Mobility Overal bed mobility: Needs Assistance Bed Mobility: Sit to Supine       Sit to supine: Supervision      Transfers Overall transfer level: Needs assistance Equipment used: 4-wheeled walker Transfers: Sit to/from Stand Sit to Stand: Supervision            Ambulation/Gait Ambulation/Gait assistance: Supervision Gait Distance (Feet): 150 Feet Assistive device: 4-wheeled walker Gait Pattern/deviations: Step-through pattern Gait velocity: functional Gait velocity interpretation: 1.31 - 2.62 ft/sec, indicative of limited community ambulator General Gait Details: step through pattern with functional gait speed   Stairs             Wheelchair Mobility    Modified Rankin (Stroke Patients Only)       Balance Overall balance assessment:  Needs assistance Sitting-balance support: Feet supported Sitting balance-Leahy Scale: Good     Standing balance support: Bilateral upper extremity supported Standing balance-Leahy Scale: Good Standing balance comment: supervision with unilateral UE support                            Cognition Arousal/Alertness: Awake/alert Behavior During Therapy: WFL for tasks assessed/performed Overall Cognitive Status: Within Functional Limits for tasks assessed                                        Exercises      General Comments General comments (skin integrity, edema, etc.): VSS      Pertinent Vitals/Pain Pain Assessment: No/denies pain    Home Living                      Prior Function            PT Goals (current goals can now be found in the care plan section) Progress towards PT goals: Progressing toward goals    Frequency    Min 3X/week      PT Plan Current plan remains appropriate    Co-evaluation              AM-PAC PT "6 Clicks" Mobility   Outcome Measure  Help needed turning from your back to your side while in a flat bed without using bedrails?: None Help needed moving from lying on your back  to sitting on the side of a flat bed without using bedrails?: None Help needed moving to and from a bed to a chair (including a wheelchair)?: None Help needed standing up from a chair using your arms (e.g., wheelchair or bedside chair)?: None Help needed to walk in hospital room?: None Help needed climbing 3-5 steps with a railing? : A Little 6 Click Score: 23    End of Session Equipment Utilized During Treatment: Gait belt Activity Tolerance: Patient tolerated treatment well Patient left: in bed;with call bell/phone within reach;with bed alarm set Nurse Communication: Mobility status PT Visit Diagnosis: Other abnormalities of gait and mobility (R26.89);Muscle weakness (generalized) (M62.81)     Time: 1324-4010 PT Time  Calculation (min) (ACUTE ONLY): 23 min  Charges:  $Gait Training: 8-22 mins $Therapeutic Activity: 8-22 mins                     Zenaida Niece, PT, DPT Acute Rehabilitation Pager: [phone]    Zenaida Niece 07/06/2019, 1:47 PM

## 2019-07-06 NOTE — Progress Notes (Signed)
ANTICOAGULATION CONSULT NOTE   Pharmacy Consult for Heparin  Indication: LV thrombus  No Known Allergies  Patient Measurements: Height: 5\' 11"  (180.3 cm) Weight: 164 lb 0.4 oz (74.4 kg) IBW/kg (Calculated) : 75.3  Heparin dosing weight: 90 kg  Vital Signs: Temp: 98.4 F (36.9 C) (11/05 1931) Temp Source: Oral (11/05 1931) BP: 99/55 (11/05 1931) Pulse Rate: 97 (11/06 0000)  Labs: Recent Labs    07/03/19 0534  07/04/19 0500 07/05/19 0500 07/05/19 1700 07/06/19 0309  HGB 7.5*  --  7.5* 7.1*  --  7.1*  HCT 24.1*  --  24.6* 22.9*  --  22.2*  PLT 440*  --  450* 424*  --  411*  LABPROT 19.0*  --  18.6* 19.8*  --   --   INR 1.6*  --  1.6* 1.7*  --   --   HEPARINUNFRC 0.85*   < > 0.31 0.23* 0.31 0.48  CREATININE  --    < > 2.09* 2.19*  --  2.13*   < > = values in this interval not displayed.    Estimated Creatinine Clearance: 31.5 mL/min (A) (by C-G formula based on SCr of 2.13 mg/dL (H)).  Assessment: 78 y.o. 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 Heparin was stopped yesterday due to low Hg (Pt received 1 unit of PRBC). GI performed an upper GI endoscopy where they found no evidence of a patent sphincterotomy in the ampulla without ulceration or evidence of recent bleeding/oozing.  11/6 AM update:  Heparin level therapeutic x 2  Hgb low but stable  Goal of Therapy:  Heparin level 0.3-0.5 (low end of range with bleeding) Monitor platelets by anticoagulation protocol: Yes   Plan:  Cont heparin at 1300 units/hr Daily CBC/HL Watch for signs/symptoms of bleeding  Narda Bonds, PharmD, BCPS Clinical Pharmacist Phone: [phone]

## 2019-07-06 NOTE — Progress Notes (Signed)
Patient ID: [name], male   DOB: 1940/09/20, 78 y.o.   MRN: [id number]    Advanced Heart Failure Rounding Note   Subjective:    S/P scope-->  No gross lesions in esophagus proximally. LA Grade C esophagitis distally. 3 cm hiatal hernia.Erythematous mucosa in the gastric body and antrum. - Non-bleeding gastric ulcers with a clean ulcer base ([place] Class III).  Hgb 7.3 => 7.9=>7.5 =>7.5 =>7.1 => 7.1  CVP 3 this morning. Creatinine stable 2.13.     Remains on heparin drip. INR up to 1.9.  No obvious source of bleeding.   Denies SOB or dizziness, walked today already.     Objective:   Weight Range:  Vital Signs:   Temp:  [97.8 F (36.6 C)-98.4 F (36.9 C)] 97.9 F (36.6 C) (11/06 0801) Pulse Rate:  [97-106] 102 (11/06 0922) Resp:  [14-25] 18 (11/06 0801) BP: (99-107)/(55-68) 100/63 (11/06 0922) SpO2:  [99 %-100 %] 100 % (11/06 0801) Weight:  [75.2 kg] 75.2 kg (11/06 0402) Last BM Date: 07/05/19  Weight change: Filed Weights   07/04/19 0500 07/05/19 0410 07/06/19 0402  Weight: 74.3 kg 74.4 kg 75.2 kg    Intake/Output:   Intake/Output Summary (Last 24 hours) at 07/06/2019 1049 Last data filed at 07/06/2019 0939 Gross per 24 hour  Intake 850 ml  Output 1050 ml  Net -200 ml   CVP 3  General: NAD Neck: No JVD, no thyromegaly or thyroid nodule.  Lungs: Clear to auscultation bilaterally with normal respiratory effort. CV: Lateral PMI.  Heart regular S1/S2, no S3/S4, no murmur.  No peripheral edema.  Abdomen: Soft, nontender, no hepatosplenomegaly, no distention.  Skin: Intact without lesions or rashes.  Neurologic: Alert and oriented x 3.  Psych: Normal affect. Extremities: No clubbing or cyanosis.  HEENT: Normal.   Telemetry:NSR. Personally reviewed.   Labs: Basic Metabolic Panel: Recent Labs  Lab 07/02/19 0500 07/03/19 1041 07/04/19 0500 07/05/19 0500 07/06/19 0309  NA 137 137 137 137 134*  K 4.1 4.0 4.5 4.1 4.1  CL 97* 100 101 102 102  CO2  29 27 28 27 25   GLUCOSE 100* 141* 90 89 94  BUN 52* 43* 41* 40* 39*  CREATININE 2.24* 2.09* 2.09* 2.19* 2.13*  CALCIUM 8.4* 8.1* 8.5* 8.3* 8.2*  MG 1.8  --   --   --  1.8  PHOS  --   --   --   --  3.2    Liver Function Tests: Recent Labs  Lab 06/30/19 0428 07/01/19 0322 07/02/19 0500  AST 37 39 41  ALT 27 28 32  ALKPHOS 54 58 61  BILITOT 4.4* 4.1* 4.0*  PROT 6.5 6.7 7.0  ALBUMIN 1.7* 1.8* 1.9*   No results for input(s): LIPASE, AMYLASE in the last 168 hours. No results for input(s): AMMONIA in the last 168 hours.  CBC: Recent Labs  Lab 07/02/19 0500 07/03/19 0534 07/04/19 0500 07/05/19 0500 07/06/19 0309  WBC 12.2* 9.6 9.2 7.7 7.1  HGB 7.9* 7.5* 7.5* 7.1* 7.1*  HCT 25.5* 24.1* 24.6* 22.9* 22.2*  MCV 95.5 96.4 97.2 97.4 94.9  PLT 443* 440* 450* 424* 411*    Cardiac Enzymes: No results for input(s): CKTOTAL, CKMB, CKMBINDEX, TROPONINI in the last 168 hours.  BNP: BNP (last 3 results) Recent Labs    08/16/18 0940 02/04/19 0906 06/18/19 2109  BNP 1,774.3* >4,500.0* 2,536.0*    ProBNP (last 3 results) No results for input(s): PROBNP in the last 8760 hours.    Other  results:  Imaging: No results found.   Medications:     Scheduled Medications: . sodium chloride   Intravenous Once  . carvedilol  3.125 mg Oral BID WC  . Chlorhexidine Gluconate Cloth  6 each Topical Daily  . feeding supplement (ENSURE ENLIVE)  237 mL Oral BID BM  . finasteride  5 mg Oral Daily  . isosorbide-hydrALAZINE  0.5 tablet Oral TID  . multivitamin with minerals  1 tablet Oral Daily  . pantoprazole  40 mg Oral BID AC  . polycarbophil  625 mg Oral Daily  . sevelamer carbonate  800 mg Oral TID WC  . simethicone  160 mg Oral TID AC & HS  . sodium chloride flush  10-40 mL Intracatheter Q12H  . tamsulosin  0.4 mg Oral Daily  . warfarin  7.5 mg Oral ONCE-1800  . Warfarin - Pharmacist Dosing Inpatient   Does not apply q1800    Infusions: . heparin 1,250 Units/hr (07/06/19  1047)    PRN Medications: alum & mag hydroxide-simeth, ondansetron (ZOFRAN) IV, polyethylene glycol, sodium chloride flush   Assessment/Plan:   1. 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 Diureses and renal function improved w/ inotropic therapy.  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 Previously declined cath. He does, of note, have anterior Qs on ECG.  Not a LHC candidate due to baseline renal function. Echo this admission showed EF < 20%, severe LV dilation, small LV apical thrombus, severely dilated and dysfunctional RV with flattened septum. Dobutamine started on 10/23 and discontinued 10/28 with stable co-ox off dobutamine. CVP 3 today.  - Can hold torsemide today with low CVP, probably restart torsemide 20 mg daily tomorrow.   - Continue low dose carvedilol 3.125 mg twice a day.  - No ARB/ARNI or digoxin due to AKI this admission.  Will see how he does as outpatient, may be able to use.  - Continue low dose Bidil 1/2 tab tid, BP too low to increase.      2. AKI: On CKD stage 3.  Initial creatinine 2.8, peaked 5.16.  Stable now at 2.1. Suspect cardiorenal syndrome likely with ATN.  Baseline creatinine was 1.5 in 1/20 and 2.5 in 6/20. Dialysis would be very difficult for him with severe biventricular failure.  3. LV thrombus: warfarin started 10/27, INR 1.9 - With ongoing anemia, think we can stop heparin at this point. - Continue warfarin.  4. Ascending cholangitis: S/p ERCP and stone removal. Abdominal US 10/30 did not show acute cholecystitis. He is off antibiotics.  5. Hyperbilirubinemia: Likely due to combination of CBD obstruction (now relieved) and RV failure with hepatic congestion/cirrhosis.  6. Hypokalemia: Resolved. 7. Anemia:  Had dark stool 10/31, given 1 unit PRBCs 10/31 with hgb down to 6.7.  Hgb 7.1 today, stable compared to yetserday.  EGD negative for  active bleeding. Non-bleeding ulcers noted. Has had feraheme.  - Now on oral PPI twice a day. - Transfuse hgb < 7.  8. NSVT: In the setting of severe LV dysfunction. Keep K and Mg in normal range.   Loralie Champagne 07/06/2019 10:49 AM

## 2019-07-07 LAB — CBC
HCT: 26.6 % — ABNORMAL LOW (ref 39.0–52.0)
Hemoglobin: 8.3 g/dL — ABNORMAL LOW (ref 13.0–17.0)
MCH: 30.4 pg (ref 26.0–34.0)
MCHC: 31.2 g/dL (ref 30.0–36.0)
MCV: 97.4 fL (ref 80.0–100.0)
Platelets: 403 10*3/uL — ABNORMAL HIGH (ref 150–400)
RBC: 2.73 MIL/uL — ABNORMAL LOW (ref 4.22–5.81)
RDW: 18 % — ABNORMAL HIGH (ref 11.5–15.5)
WBC: 7.4 10*3/uL (ref 4.0–10.5)
nRBC: 0 % (ref 0.0–0.2)

## 2019-07-07 LAB — BPAM RBC
Blood Product Expiration Date: 202012042359
ISSUE DATE / TIME: 202011061604
Unit Type and Rh: 6200

## 2019-07-07 LAB — MAGNESIUM: Magnesium: 1.8 mg/dL (ref 1.7–2.4)

## 2019-07-07 LAB — TYPE AND SCREEN
ABO/RH(D): A POS
Antibody Screen: NEGATIVE
Unit division: 0

## 2019-07-07 LAB — PROTIME-INR
INR: 1.9 — ABNORMAL HIGH (ref 0.8–1.2)
Prothrombin Time: 21.3 seconds — ABNORMAL HIGH (ref 11.4–15.2)

## 2019-07-07 LAB — PHOSPHORUS: Phosphorus: 3.3 mg/dL (ref 2.5–4.6)

## 2019-07-07 LAB — COMPREHENSIVE METABOLIC PANEL
ALT: 31 U/L (ref 0–44)
AST: 35 U/L (ref 15–41)
Albumin: 2 g/dL — ABNORMAL LOW (ref 3.5–5.0)
Alkaline Phosphatase: 64 U/L (ref 38–126)
Anion gap: 8 (ref 5–15)
BUN: 39 mg/dL — ABNORMAL HIGH (ref 8–23)
CO2: 26 mmol/L (ref 22–32)
Calcium: 8.3 mg/dL — ABNORMAL LOW (ref 8.9–10.3)
Chloride: 101 mmol/L (ref 98–111)
Creatinine, Ser: 2.05 mg/dL — ABNORMAL HIGH (ref 0.61–1.24)
GFR calc Af Amer: 36 mL/min — ABNORMAL LOW (ref >60)
GFR calc non Af Amer: 31 mL/min — ABNORMAL LOW (ref >60)
Glucose, Bld: 106 mg/dL — ABNORMAL HIGH (ref 70–99)
Potassium: 3.8 mmol/L (ref 3.5–5.1)
Sodium: 135 mmol/L (ref 135–145)
Total Bilirubin: 2.9 mg/dL — ABNORMAL HIGH (ref 0.3–1.2)
Total Protein: 6.8 g/dL (ref 6.5–8.1)

## 2019-07-07 MED ORDER — WARFARIN SODIUM 10 MG PO TABS
10.0000 mg | ORAL_TABLET | Freq: Once | ORAL | Status: AC
  Administered 2019-07-07: 10 mg via ORAL
  Filled 2019-07-07: qty 1

## 2019-07-07 NOTE — Progress Notes (Signed)
CARDIAC REHAB PHASE I   PRE:  Rate/Rhythm: 99 SR  BP:  Supine: 98/68  Sitting:   Standing:    SaO2: 99% RA  MODE:  Ambulation: 240 ft   POST:  Rate/Rhythm: 113 ST  BP:  Supine:   Sitting: 101/65  Standing:    SaO2: 95% RA Tolerated ambulation well with assistance x 2 due to equipment and rolling walker.  States he feels better today and might go home tomorrow.  Given CHF booklet, discussed the 3 zones and importance of daily weights.  Patient voiced understanding. [phone] Liliane Channel RN, BSN 07/07/2019 3:22 PM

## 2019-07-07 NOTE — Progress Notes (Addendum)
Patient ID: [name], male   DOB: May 20, 1941, 78 y.o.   MRN: [id number]    Advanced Heart Failure Rounding Note   Subjective:    S/P scope-->  No gross lesions in esophagus proximally. LA Grade C esophagitis distally. 3 cm hiatal hernia.Erythematous mucosa in the gastric body and antrum. - Non-bleeding gastric ulcers with a clean ulcer base (Forrest Class III).  Hgb  7.1 -> 8.3 after 1u RBC yesterday. No bleeding.    CVP 4-5 this morning. Creatinine stable 2.05.     Off heparin drip. Walking hall with walker. No CP or SOB.     Objective:   Weight Range:  Vital Signs:   Temp:  [97.5 F (36.4 C)-98.2 F (36.8 C)] 97.9 F (36.6 C) (11/07 0349) Pulse Rate:  [95-108] 107 (11/07 0349) Resp:  [19-26] 25 (11/07 0349) BP: (89-107)/(58-69) 107/69 (11/07 0349) SpO2:  [97 %-100 %] 100 % (11/07 0349) Weight:  [75.2 kg] 75.2 kg (11/07 0349) Last BM Date: 07/07/19  Weight change: Filed Weights   07/05/19 0410 07/06/19 0402 07/07/19 0349  Weight: 74.4 kg 75.2 kg 75.2 kg    Intake/Output:   Intake/Output Summary (Last 24 hours) at 07/07/2019 0831 Last data filed at 07/07/2019 0354 Gross per 24 hour  Intake 1045 ml  Output 1600 ml  Net -555 ml   CVP 4-5 General:  Elderly sitting up in bed No resp difficulty HEENT: normal Neck: supple. no JVD. Carotids 2+ bilat; no bruits. No lymphadenopathy or thryomegaly appreciated. Cor: PMI nondisplaced. Regular tachy. No rubs, gallops or murmurs. Lungs: clear Abdomen: soft, nontender, nondistended. No hepatosplenomegaly. No bruits or masses. Good bowel sounds. Extremities: no cyanosis, clubbing, rash, edema Neuro: alert & orientedx3, cranial nerves grossly intact. moves all 4 extremities w/o difficulty. Affect pleasant   Telemetry: ST 100-110 Personally reviewed.   Labs: Basic Metabolic Panel: Recent Labs  Lab 07/02/19 0500 07/03/19 1041 07/04/19 0500 07/05/19 0500 07/06/19 0309 07/07/19 0435  NA 137 137 137 137 134* 135   K 4.1 4.0 4.5 4.1 4.1 3.8  CL 97* 100 101 102 102 101  CO2 29 27 28 27 25 26   GLUCOSE 100* 141* 90 89 94 106*  BUN 52* 43* 41* 40* 39* 39*  CREATININE 2.24* 2.09* 2.09* 2.19* 2.13* 2.05*  CALCIUM 8.4* 8.1* 8.5* 8.3* 8.2* 8.3*  MG 1.8  --   --   --  1.8 1.8  PHOS  --   --   --   --  3.2 3.3    Liver Function Tests: Recent Labs  Lab 07/01/19 0322 07/02/19 0500 07/07/19 0435  AST 39 41 35  ALT 28 32 31  ALKPHOS 58 61 64  BILITOT 4.1* 4.0* 2.9*  PROT 6.7 7.0 6.8  ALBUMIN 1.8* 1.9* 2.0*   No results for input(s): LIPASE, AMYLASE in the last 168 hours. No results for input(s): AMMONIA in the last 168 hours.  CBC: Recent Labs  Lab 07/04/19 0500 07/05/19 0500 07/06/19 0309 07/06/19 2200 07/07/19 0435  WBC 9.2 7.7 7.1 6.9 7.4  HGB 7.5* 7.1* 7.1* 8.1* 8.3*  HCT 24.6* 22.9* 22.2* 25.9* 26.6*  MCV 97.2 97.4 94.9 97.7 97.4  PLT 450* 424* 411* 401* 403*    Cardiac Enzymes: No results for input(s): CKTOTAL, CKMB, CKMBINDEX, TROPONINI in the last 168 hours.  BNP: BNP (last 3 results) Recent Labs    08/16/18 0940 02/04/19 0906 06/18/19 2109  BNP 1,774.3* >4,500.0* 2,536.0*    ProBNP (last 3 results) No results for input(s):  PROBNP in the last 8760 hours.    Other results:  Imaging: No results found.   Medications:     Scheduled Medications: . carvedilol  3.125 mg Oral BID WC  . Chlorhexidine Gluconate Cloth  6 each Topical Daily  . feeding supplement (ENSURE ENLIVE)  237 mL Oral BID BM  . finasteride  5 mg Oral Daily  . isosorbide-hydrALAZINE  0.5 tablet Oral TID  . multivitamin with minerals  1 tablet Oral Daily  . pantoprazole  40 mg Oral BID AC  . polycarbophil  625 mg Oral Daily  . sevelamer carbonate  800 mg Oral TID WC  . simethicone  160 mg Oral TID AC & HS  . sodium chloride flush  10-40 mL Intracatheter Q12H  . tamsulosin  0.4 mg Oral Daily  . torsemide  20 mg Oral Daily  . Warfarin - Pharmacist Dosing Inpatient   Does not apply q1800     Infusions:   PRN Medications: alum & mag hydroxide-simeth, ondansetron (ZOFRAN) IV, polyethylene glycol, sodium chloride flush   Assessment/Plan:   1. 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 Diureses and renal function improved w/ inotropic therapy.  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 Previously declined cath. He does, of note, have anterior Qs on ECG.  Not a LHC candidate due to baseline renal function. Echo this admission showed EF < 20%, severe LV dilation, small LV apical thrombus, severely dilated and dysfunctional RV with flattened septum. Dobutamine started on 10/23 and discontinued 10/28 with stable co-ox off dobutamine. CVP 4-5 today.  - Will restart torsemide 20 daily  - Continue low dose carvedilol 3.125 mg twice a day.  - No ARB/ARNI or digoxin due to AKI this admission.  Will see how he does as outpatient, may be able to use.  - Continue low dose Bidil 1/2 tab tid, BP too low to increase.      2. AKI: On CKD stage 3.  Initial creatinine 2.8, peaked 5.16.  Stable now at 2.0 -2.11. Suspect cardiorenal syndrome likely with ATN.  Baseline creatinine was 1.5 in 1/20 and 2.5 in 6/20. Dialysis would be very difficult for him with severe biventricular failure.  3. LV thrombus: warfarin started 10/27, INR 1.9 - Off heparin.  - Continue warfarin.  Discussed dosing with PharmD personally. 4. Ascending cholangitis: S/p ERCP and stone removal. Abdominal US 10/30 did not show acute cholecystitis. He is off antibiotics.  5. Hyperbilirubinemia: Likely due to combination of CBD obstruction (now relieved) and RV failure with hepatic congestion/cirrhosis.  6. Hypokalemia: Resolved. 7. Anemia:  Had dark stool 10/31, given 1 unit PRBCs 10/31 with hgb down to 6.7.  Hgb 7.1  Yesterday. Given 1u RBC now 8.3  EGD negative for active bleeding. Non-bleeding ulcers noted. Has had  feraheme.  - Now on oral PPI twice a day. - Transfuse hgb < 7.  8. NSVT: In the setting of severe LV dysfunction. Keep K and Mg in normal range.   Stable for discharge from HF standpoint.   D/c HF meds  Warfarin Torsemide 40 daily Bidil 1/2 tablet tid Carvedilol 3.125 bid   F/u HF Clinic and coumadin clinic arranged.    Quillian Quince Bensimhon 07/07/2019 8:31 AM

## 2019-07-07 NOTE — Progress Notes (Signed)
PROGRESS NOTE    [name]  JOA:[id number] DOB: [birth year] DOA: 06/18/2019 PCP: [name], MD   Brief Narrative:  78 year old with history of HTN, HLD, DM2, CKD stage IIIb, systolic CHF ejection fraction 25-30%, chronic Foley, alcohol use, sepsis secondary to acute cholecystitis status post IR cholecystostomy initially presented with abdominal pain diagnosed with cholelithiasis and cholangitis.  ERCP performed 10/20-stone was removed.  Hospital course complicated by acute systolic heart failure and acute kidney injury initially placed on dobutamine drip CHF team and nephrology was consulted.  Found to have LV thrombus started on heparin and  later noted to have bloody bowel movement.  Underwent EGD 11/1 which was negative for bleeding started back on Coumadin.  INR is subtherapeutic.  Heparin has been discontinued and patient on Coumadin pharmacy to dose.  INR today is 1.9.  Patient is scheduled to follow-up with INR check at outpatient clinic on Monday. His hemoglobin dropped to 7.1 and he is status post transfusion of 1 unit of packed red blood cell.  Recheck hemoglobin today is 8.3. Cardiology is okay for discharge and to follow-up at advanced heart failure clinic.  Assessment & Plan:   Principal Problem:   Cholangitis due to bile duct calculus with obstruction Active Problems:   Hyperlipidemia   Diabetes mellitus without complication (HCC)   CKD (chronic kidney disease), stage III (HCC)   AKI (acute kidney injury) ([place])   Acute on chronic systolic and diastolic heart failure, NYHA class 1 (HCC)   Essential hypertension   Acute renal failure (ARF) (HCC)   Abdominal pain   Acute lower UTI   Choledocholithiasis   Cardiogenic shock (HCC)   Dysphagia   Palliative care encounter   GI bleed with acute blood loss anemia, suspected upper   Acute blood loss anemia secondary to melanotic stool LA grade C esophagitis and gastric ulcer Continue to monitor hemoglobin   Continue with Protonix twice daily Cardio d/c heparin and c/w coumadin. He is status post 1 unit of packed red blood cell transfusion with torsemide.     Acute decompensated heart failure with reduced ejection fraction.  Last echocardiogram showed EF of less than 20%.  NYHA III-IV, ACC/AHA C. Per the guideline, patient qualifies for ARNI with Entresto   Serum creatinine today is 2.19. The [facility] and HOPE trial, which is a large ACE inhibitor trial, showed that patient with heart failure with reduced ejection fraction with EF of less than 40% and with high serum creatinine has much improved mortality and morbidity with starting ACE inhibitor than patients with normal serum creatinine.  Creatinine is a measure of risk. In the study, patients with high creatinine above 3, was started on reduced dose of ACE inhibitor.   Left ventricular thrombus Heparin discontinued by cardiology and continued on Coumadin. INR today is 1.9. He is scheduled for INR check on Monday at outpatient clinic   Acute kidney injury on CKD stage IIIb.  Resolved Baseline creatinine 2.5.  Serum creatinine now at 2.05  Cholangitis secondary to obstructive choledocholithiasis Ileus.  Status post ERCP and stone removal.  Abdominal ultrasound was negative for acute cholecystitis. Resolved   History of BPH with chronic indwelling Foley Chronic Foley removed by urology 11/23.  Follow-up with Urology in 1-2 weeks. Continue Flomax and finasteride  Constipation Bowel regimen with Dulcolax suppository if needed   DVT prophylaxis: Coumadin PTD Code Status: DNR Family Communication:None  Disposition Plan: Possible d/c home. Appointment set for his INR monitor as outpatient.  Subjective: Patient is sitting out of bed.  Not in acute distress.  Alert and oriented x3.He asked when he can be discharged home.                                                                     Review of Systems Otherwise negative  except as per HPI, including: General: Denies fever, chills, night sweats or unintended weight loss. Resp: Denies cough, wheezing, shortness of breath. Cardiac: Denies chest pain, palpitations, orthopnea, paroxysmal nocturnal dyspnea. GI: Denies abdominal pain, nausea, vomiting, diarrhea or constipation GU: Denies dysuria, frequency, hesitancy or incontinence MS: Denies muscle aches, joint pain or swelling Neuro: Denies headache, neurologic deficits (focal weakness, numbness, tingling), abnormal gait Psych: Denies anxiety or  depression Skin: Denies new rashes or lesions ID: Denies sick contacts, exotic exposures, travel  Objective: Vitals:   07/06/19 2000 07/06/19 2347 07/07/19 0000 07/07/19 0349  BP: 96/67 102/69  107/69  Pulse: 99 (!) 104 (!) 105 (!) 107  Resp: 19 (!) 21 19 (!) 25  Temp: 97.6 F (36.4 C) 98 F (36.7 C)  97.9 F (36.6 C)  TempSrc: Oral Oral  Oral  SpO2: 100% 99% 99% 100%  Weight:    75.2 kg  Height:        Intake/Output Summary (Last 24 hours) at 07/07/2019 1504 Last data filed at 07/07/2019 1400 Gross per 24 hour  Intake 555 ml  Output 2500 ml  Net -1945 ml   Filed Weights   07/05/19 0410 07/06/19 0402 07/07/19 0349  Weight: 74.4 kg 75.2 kg 75.2 kg    Examination:  General exam: Appears calm and comfortable  Respiratory system: Minimal bibasilar crackles but otherwise clear Cardiovascular system: S1 & S2 heard, RRR. No JVD, murmurs, rubs, gallops or clicks. No pedal edema. Gastrointestinal system: Abdomen is nondistended, soft and nontender. No organomegaly or masses felt. Normal bowel sounds heard. Central nervous system: Alert and oriented. No focal neurological deficits. Extremities: Symmetric 5 x 5 power. Skin: No rashes, lesions or ulcers Psychiatry: Judgement and insight appear normal. Mood & affect appropriate.     Data Reviewed:   CBC: Recent Labs  Lab 07/04/19 0500 07/05/19 0500 07/06/19 0309 07/06/19 2200 07/07/19 0435  WBC  9.2 7.7 7.1 6.9 7.4  HGB 7.5* 7.1* 7.1* 8.1* 8.3*  HCT 24.6* 22.9* 22.2* 25.9* 26.6*  MCV 97.2 97.4 94.9 97.7 97.4  PLT 450* 424* 411* 401* 696*   Basic Metabolic Panel: Recent Labs  Lab 07/02/19 0500 07/03/19 1041 07/04/19 0500 07/05/19 0500 07/06/19 0309 07/07/19 0435  NA 137 137 137 137 134* 135  K 4.1 4.0 4.5 4.1 4.1 3.8  CL 97* 100 101 102 102 101  CO2 29 27 28 27 25 26   GLUCOSE 295* 141* 90 89 94 106*  BUN 52* 43* 41* 40* 39* 39*  CREATININE 2.24* 2.09* 2.09* 2.19* 2.13* 2.05*  CALCIUM 8.4* 8.1* 8.5* 8.3* 8.2* 8.3*  MG 1.8  --   --   --  1.8 1.8  PHOS  --   --   --   --  3.2 3.3   GFR: Estimated Creatinine Clearance: 33.1 mL/min (A) (by C-G formula based on SCr of 2.05 mg/dL (H)). Liver Function Tests: Recent Labs  Lab 07/01/19 [id number]  07/02/19 0500 07/07/19 0435  AST 39 41 35  ALT 28 32 31  ALKPHOS 58 61 64  BILITOT 4.1* 4.0* 2.9*  PROT 6.7 7.0 6.8  ALBUMIN 1.8* 1.9* 2.0*   No results for input(s): LIPASE, AMYLASE in the last 168 hours. No results for input(s): AMMONIA in the last 168 hours. Coagulation Profile: Recent Labs  Lab 07/03/19 0534 07/04/19 0500 07/05/19 0500 07/06/19 0309 07/07/19 0435  INR 1.6* 1.6* 1.7* 1.9* 1.9*   Cardiac Enzymes: No results for input(s): CKTOTAL, CKMB, CKMBINDEX, TROPONINI in the last 168 hours. BNP (last 3 results) No results for input(s): PROBNP in the last 8760 hours. HbA1C: No results for input(s): HGBA1C in the last 72 hours. CBG: Recent Labs  Lab 07/01/19 0744 07/01/19 1108 07/01/19 1313 07/02/19 1214 07/02/19 1656  GLUCAP 106* 90 104* 177* 145*   Lipid Profile: No results for input(s): CHOL, HDL, LDLCALC, TRIG, CHOLHDL, LDLDIRECT in the last 72 hours. Thyroid Function Tests: No results for input(s): TSH, T4TOTAL, FREET4, T3FREE, THYROIDAB in the last 72 hours. Anemia Panel: No results for input(s): VITAMINB12, FOLATE, FERRITIN, TIBC, IRON, RETICCTPCT in the last 72 hours. Sepsis Labs: No results  for input(s): PROCALCITON, LATICACIDVEN in the last 168 hours.  No results found for this or any previous visit (from the past 240 hour(s)).       Radiology Studies: No results found.      Scheduled Meds: . carvedilol  3.125 mg Oral BID WC  . Chlorhexidine Gluconate Cloth  6 each Topical Daily  . feeding supplement (ENSURE ENLIVE)  237 mL Oral BID BM  . finasteride  5 mg Oral Daily  . isosorbide-hydrALAZINE  0.5 tablet Oral TID  . multivitamin with minerals  1 tablet Oral Daily  . pantoprazole  40 mg Oral BID AC  . polycarbophil  625 mg Oral Daily  . sevelamer carbonate  800 mg Oral TID WC  . simethicone  160 mg Oral TID AC & HS  . sodium chloride flush  10-40 mL Intracatheter Q12H  . tamsulosin  0.4 mg Oral Daily  . torsemide  20 mg Oral Daily  . warfarin  10 mg Oral ONCE-1800  . Warfarin - Pharmacist Dosing Inpatient   Does not apply q1800   Continuous Infusions:    LOS: 18 days   Total time spent on this encounter is 35 minutes.      Elie Confer, MD [facility] [phone]  If 7PM-7AM, please contact night-coverage  07/07/2019, 3:04 PM

## 2019-07-07 NOTE — Progress Notes (Signed)
[facility] for Heparin > warfarin Indication: LV thrombus  No Known Allergies  Patient Measurements: Height: 5\' 11"  (180.3 cm) Weight: 165 lb 12.6 oz (75.2 kg) IBW/kg (Calculated) : 75.3  Heparin dosing weight: 74.4 kg  Vital Signs: Temp: 97.9 F (36.6 C) (11/07 0349) Temp Source: Oral (11/07 0349) BP: 107/69 (11/07 0349) Pulse Rate: 107 (11/07 0349)  Labs: Recent Labs    07/05/19 0500 07/05/19 1700 07/06/19 0309 07/06/19 2200 07/07/19 0435  HGB 7.1*  --  7.1* 8.1* 8.3*  HCT 22.9*  --  22.2* 25.9* 26.6*  PLT 424*  --  411* 401* 403*  LABPROT 19.8*  --  21.1*  --  21.3*  INR 1.7*  --  1.9*  --  1.9*  HEPARINUNFRC 0.23* 0.31 0.48  --   --   CREATININE 2.19*  --  2.13*  --  2.05*    Estimated Creatinine Clearance: 33.1 mL/min (A) (by C-G formula based on SCr of 2.05 mg/dL (H)).  Assessment: 78 y.o. 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 Heparin was stopped yesterday due to low Hgb (Pt received 1 unit of PRBCs). GI performed an upper GI endoscopy where they found no evidence of a patent sphincterotomy in the ampulla without ulceration or evidence of recent bleeding/oozing.  INR today 1.9 slowly increasing - will give boost today. Hgb 7.1 down yesterday improved post prbc today. No overt bleeding, plt 400s   Goal of Therapy:  INR 2-3 Heparin level 0.3-0.5 (low end of range with bleeding) Monitor platelets by anticoagulation protocol: Yes   Plan:  Heparin stopped Give warfarin 10 mg x 1 tonight probabaly best to DC home on warfarin 5mg  tabs and have him take 7.5mg  daily until seen by CVRR as outpt Daily Pt-INR Monitor CBC, heparin level daily Watch for signs/symptoms of bleeding  Bonnita Nasuti Pharm.D. CPP, BCPS Clinical Pharmacist [phone] 07/07/2019 8:38 AM

## 2019-07-07 NOTE — Plan of Care (Signed)
  Problem: Clinical Measurements: Goal: Will remain free from infection Outcome: Progressing   Problem: Safety: Goal: Ability to remain free from injury will improve Outcome: Progressing   Problem: Elimination: Goal: Will not experience complications related to urinary retention Outcome: Not Progressing

## 2019-07-08 LAB — COMPREHENSIVE METABOLIC PANEL
ALT: 29 U/L (ref 0–44)
AST: 30 U/L (ref 15–41)
Albumin: 1.9 g/dL — ABNORMAL LOW (ref 3.5–5.0)
Alkaline Phosphatase: 64 U/L (ref 38–126)
Anion gap: 7 (ref 5–15)
BUN: 42 mg/dL — ABNORMAL HIGH (ref 8–23)
CO2: 26 mmol/L (ref 22–32)
Calcium: 8.3 mg/dL — ABNORMAL LOW (ref 8.9–10.3)
Chloride: 103 mmol/L (ref 98–111)
Creatinine, Ser: 2.02 mg/dL — ABNORMAL HIGH (ref 0.61–1.24)
GFR calc Af Amer: 36 mL/min — ABNORMAL LOW (ref >60)
GFR calc non Af Amer: 31 mL/min — ABNORMAL LOW (ref >60)
Glucose, Bld: 91 mg/dL (ref 70–99)
Potassium: 3.9 mmol/L (ref 3.5–5.1)
Sodium: 136 mmol/L (ref 135–145)
Total Bilirubin: 2.7 mg/dL — ABNORMAL HIGH (ref 0.3–1.2)
Total Protein: 6.8 g/dL (ref 6.5–8.1)

## 2019-07-08 LAB — PROTIME-INR
INR: 2.1 — ABNORMAL HIGH (ref 0.8–1.2)
Prothrombin Time: 22.9 seconds — ABNORMAL HIGH (ref 11.4–15.2)

## 2019-07-08 LAB — CBC
HCT: 25.2 % — ABNORMAL LOW (ref 39.0–52.0)
Hemoglobin: 7.8 g/dL — ABNORMAL LOW (ref 13.0–17.0)
MCH: 30 pg (ref 26.0–34.0)
MCHC: 31 g/dL (ref 30.0–36.0)
MCV: 96.9 fL (ref 80.0–100.0)
Platelets: 372 10*3/uL (ref 150–400)
RBC: 2.6 MIL/uL — ABNORMAL LOW (ref 4.22–5.81)
RDW: 17.8 % — ABNORMAL HIGH (ref 11.5–15.5)
WBC: 7.8 10*3/uL (ref 4.0–10.5)
nRBC: 0 % (ref 0.0–0.2)

## 2019-07-08 LAB — MAGNESIUM: Magnesium: 1.6 mg/dL — ABNORMAL LOW (ref 1.7–2.4)

## 2019-07-08 LAB — PHOSPHORUS: Phosphorus: 3.3 mg/dL (ref 2.5–4.6)

## 2019-07-08 MED ORDER — TORSEMIDE 20 MG PO TABS
40.0000 mg | ORAL_TABLET | Freq: Every day | ORAL | Status: DC
  Administered 2019-07-08 – 2019-07-09 (×2): 40 mg via ORAL
  Filled 2019-07-08 (×2): qty 2

## 2019-07-08 MED ORDER — CARVEDILOL 3.125 MG PO TABS: 3.1250 mg | ORAL_TABLET | Freq: Two times a day (BID) | ORAL | 11 refills | Status: DC

## 2019-07-08 MED ORDER — ALUM & MAG HYDROXIDE-SIMETH 200-200-20 MG/5ML PO SUSP: 15.0000 mL | ORAL | 0 refills | Status: DC | PRN

## 2019-07-08 MED ORDER — MAGNESIUM SULFATE 2 GM/50ML IV SOLN
2.0000 g | Freq: Once | INTRAVENOUS | Status: AC
  Administered 2019-07-08: 2 g via INTRAVENOUS
  Filled 2019-07-08: qty 50

## 2019-07-08 MED ORDER — ENSURE ENLIVE PO LIQD: 237.0000 mL | Freq: Two times a day (BID) | ORAL | 12 refills | Status: DC

## 2019-07-08 MED ORDER — WARFARIN SODIUM 7.5 MG PO TABS: 7.5000 mg | ORAL_TABLET | Freq: Once | ORAL | 0 refills | Status: DC

## 2019-07-08 MED ORDER — ADULT MULTIVITAMIN W/MINERALS CH: 1.0000 | ORAL_TABLET | Freq: Every day | ORAL | 0 refills | Status: AC

## 2019-07-08 MED ORDER — WARFARIN SODIUM 7.5 MG PO TABS
7.5000 mg | ORAL_TABLET | Freq: Once | ORAL | Status: AC
  Administered 2019-07-08: 7.5 mg via ORAL
  Filled 2019-07-08: qty 1

## 2019-07-08 MED ORDER — POLYETHYLENE GLYCOL 3350 17 G PO PACK: 17.0000 g | PACK | Freq: Every day | ORAL | 0 refills | Status: DC | PRN

## 2019-07-08 MED ORDER — TORSEMIDE 20 MG PO TABS: 40.0000 mg | ORAL_TABLET | Freq: Every day | ORAL | 0 refills | Status: DC

## 2019-07-08 MED ORDER — TAMSULOSIN HCL 0.4 MG PO CAPS: 0.4000 mg | ORAL_CAPSULE | Freq: Every day | ORAL | 0 refills | Status: DC

## 2019-07-08 MED ORDER — SEVELAMER CARBONATE 800 MG PO TABS: 800.0000 mg | ORAL_TABLET | Freq: Three times a day (TID) | ORAL | 0 refills | Status: AC

## 2019-07-08 MED ORDER — PANTOPRAZOLE SODIUM 40 MG PO TBEC: 40.0000 mg | DELAYED_RELEASE_TABLET | Freq: Two times a day (BID) | ORAL | 3 refills | Status: DC

## 2019-07-08 MED ORDER — SIMETHICONE 80 MG PO CHEW: 160.0000 mg | CHEWABLE_TABLET | Freq: Three times a day (TID) | ORAL | 0 refills | Status: DC

## 2019-07-08 MED ORDER — ISOSORB DINITRATE-HYDRALAZINE 20-37.5 MG PO TABS: 0.5000 | ORAL_TABLET | Freq: Three times a day (TID) | ORAL | 5 refills | Status: AC

## 2019-07-08 MED ORDER — FINASTERIDE 5 MG PO TABS: 5.0000 mg | ORAL_TABLET | Freq: Every day | ORAL | 0 refills | Status: AC

## 2019-07-08 NOTE — Discharge Summary (Signed)
Physician Discharge Summary  Patient ID: [name] MRN: [id number] DOB/AGE: [birth year] 78 y.o.  Admit date: 06/18/2019 Discharge date: 07/08/2019  Admission Diagnoses: Acute decompensated heart failure with reduced ejection fraction Acute blood loss anemia secondary to GI bleed Cholangitis due to bile duct calculus with obstruction  Discharge Diagnoses:  Principal Problem:   Cholangitis due to bile duct calculus with obstruction Active Problems:   Hyperlipidemia   Diabetes mellitus without complication (HCC)   CKD (chronic kidney disease), stage III (HCC)   AKI (acute kidney injury) ([place])   Acute on chronic systolic and diastolic heart failure, NYHA class 1 ([place])   Essential hypertension   Acute renal failure (ARF) (HCC)   Abdominal pain   Acute lower UTI   Choledocholithiasis   Cardiogenic shock (HCC)   Dysphagia   Palliative care encounter   GI bleed with acute blood loss anemia, suspected upper   Discharged Condition: stable  Hospital Course:  78 year old with history of HTN, HLD, DM2, CKD stage IIIb, systolic CHF ejection fraction 25-30%, chronic Foley, alcohol use, sepsis secondary to acute cholecystitis status post IR cholecystostomy initially presented with abdominal pain diagnosed with cholelithiasis and cholangitis.  ERCP performed 10/20-stone was removed.  Hospital course complicated by acute systolic heart failure and acute kidney injury initially placed on dobutamine drip CHF team and nephrology was consulted.  Found to have LV thrombus started on heparin and  later noted to have bloody bowel movement.  Underwent EGD 11/1 which was negative for bleeding started back on Coumadin.  INR is subtherapeutic.  Heparin has been discontinued and patient on Coumadin pharmacy to dose.  INR today is 1.9.  Patient is scheduled to follow-up with INR check at outpatient clinic on Monday. His hemoglobin dropped to 7.1 and he is status post transfusion of 1 unit of packed red  blood cell.  Recheck hemoglobin today is 7.8. Cardiology is okay for discharge and to follow-up at advanced heart failure clinic in 1 week. Patient has appointment scheduled for INR clinic tomorrow, Monday, 06/08/2019 He is discharged home on warfarin, torsemide 40 mg daily, BiDil 0.5 mg 3 times a day and carvedilol 3.125 mg twice a day.   Consults: cardiology and GI  Significant Diagnostic Studies: Right upper quadrant abdominal ultrasound showing contracted gallbladder containing a 2.2 cm calculus, no evidence of acute cholecystitis, mild nodular contours of the liver demonstrated, small to moderate amount of ascites and echogenic right kidney compatible with medical renal disease. Ultrasound of the liver showed 1.2 cm right hepatic cyst and a 1.1 cm echogenic lesion in the right hepatic lobe possibly hemangioma and a nodular liver contour suggesting cirrhosis.   EGD by GI due to melena Endoscopic retrograde cholangiopancreatography with findings of choledocholithiasis which was extracted. CT abdomen and pelvis showing biliary dilatation due to choledocholithiasis with a 13 mm stone, unexpectedly collapsed gallbladder given the degree of biliary dilatation.  Cardiomegaly with anasarca and small pleural effusion with findings of LA grade C esophagitis with no bleeding and a 3 cm hiatal hernia.  Treatments: cardiac meds: Torsemide, BiDil, carvedilol   Discharge Exam: Blood pressure 99/61, pulse 100, temperature 98.3 F (36.8 C), temperature source Oral, resp. rate 19, height 5\' 11"  (1.803 m), weight 76.1 kg, SpO2 99 %. General appearance: alert, cooperative, appears stated age and no distress Head: Normocephalic, without obvious abnormality, atraumatic Throat: lips, mucosa, and tongue normal; teeth and gums normal Chest wall: no tenderness Cardio: regular rate and rhythm, S1, S2 normal, no murmur, click, rub or gallop  GI: soft, non-tender; bowel sounds normal; no masses,  no  organomegaly Extremities: extremities normal, atraumatic, no cyanosis or edema Skin: Skin color, texture, turgor normal. No rashes or lesions or Hyperpigmentation Neurologic: Alert and oriented X 3, normal strength and tone. Normal symmetric reflexes. Normal coordination and gait  Disposition: Discharge disposition: 01-Home or Self Care       Discharge Instructions    Diet - low sodium heart healthy   Complete by: As directed    Discharge instructions   Complete by: As directed    Patient has appointment scheduled for INR clinic tomorrow   Increase activity slowly   Complete by: As directed      Allergies as of 07/08/2019   No Known Allergies     Medication List    STOP taking these medications   furosemide 40 MG tablet Commonly known as: LASIX     TAKE these medications   alum & mag hydroxide-simeth 200-200-20 MG/5ML suspension Commonly known as: MAALOX/MYLANTA Take 15 mLs by mouth every 4 (four) hours as needed for indigestion or heartburn.   carvedilol 3.125 MG tablet Commonly known as: Coreg Take 1 tablet (3.125 mg total) by mouth 2 (two) times daily. What changed:   medication strength  how much to take   feeding supplement (ENSURE ENLIVE) Liqd Take 237 mLs by mouth 2 (two) times daily between meals. Start taking on: July 09, 2019   finasteride 5 MG tablet Commonly known as: Proscar Take 1 tablet (5 mg total) by mouth daily. Start taking on: July 09, 2019   isosorbide-hydrALAZINE 20-37.5 MG tablet Commonly known as: BIDIL Take 0.5 tablets by mouth 3 (three) times daily.   multivitamin with minerals Tabs tablet Take 1 tablet by mouth daily. Start taking on: July 09, 2019   pantoprazole 40 MG tablet Commonly known as: PROTONIX Take 1 tablet (40 mg total) by mouth 2 (two) times daily before a meal. Start taking on: July 09, 2019   polyethylene glycol 17 g packet Commonly known as: MIRALAX / GLYCOLAX Take 17 g by mouth daily as needed  for moderate constipation.   sevelamer carbonate 800 MG tablet Commonly known as: RENVELA Take 1 tablet (800 mg total) by mouth 3 (three) times daily with meals. Start taking on: July 09, 2019   simethicone 80 MG chewable tablet Commonly known as: MYLICON Chew 2 tablets (160 mg total) by mouth 4 (four) times daily -  before meals and at bedtime.   tamsulosin 0.4 MG Caps capsule Commonly known as: FLOMAX Take 1 capsule (0.4 mg total) by mouth daily.   torsemide 20 MG tablet Commonly known as: DEMADEX Take 2 tablets (40 mg total) by mouth daily. Start taking on: July 09, 2019   warfarin 7.5 MG tablet Commonly known as: COUMADIN Take 1 tablet (7.5 mg total) by mouth one time only at 6 PM.      Follow-up Information    [place] Follow up on 07/12/2019.   Specialty: Cardiology Why: [place] information: [address] [id number] [place] [phone]         Total time spent on this discharge encounter is 40 minutes  Signed: Elie Confer 07/08/2019, 4:46 PM

## 2019-07-08 NOTE — Progress Notes (Signed)
   Weight up slightly overnight. I increased torsemide to 40 daily. Mag supped  Glori Bickers, MD  7:29 AM

## 2019-07-08 NOTE — Progress Notes (Signed)
ANTICOAGULATION CONSULT NOTE   Pharmacy Consult for Heparin > warfarin Indication: LV thrombus  No Known Allergies  Patient Measurements: Height: 5\' 11"  (180.3 cm) Weight: 167 lb 12.3 oz (76.1 kg) IBW/kg (Calculated) : 75.3  Heparin dosing weight: 74.4 kg  Vital Signs: Temp: 98.5 F (36.9 C) (11/08 1143) Temp Source: Oral (11/08 1143) BP: 106/72 (11/08 1143) Pulse Rate: 113 (11/08 1143)  Labs: Recent Labs    07/05/19 1700  07/06/19 0309 07/06/19 2200 07/07/19 0435 07/08/19 0425  HGB  --    < > 7.1* 8.1* 8.3* 7.8*  HCT  --    < > 22.2* 25.9* 26.6* 25.2*  PLT  --    < > 411* 401* 403* 372  LABPROT  --   --  21.1*  --  21.3* 22.9*  INR  --   --  1.9*  --  1.9* 2.1*  HEPARINUNFRC 0.31  --  0.48  --   --   --   CREATININE  --   --  2.13*  --  2.05* 2.02*   < > = values in this interval not displayed.    Estimated Creatinine Clearance: 33.7 mL/min (A) (by C-G formula based on SCr of 2.02 mg/dL (H)).  Assessment: 78 y.o. 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 Previously on heparin which was stopped on 07/06/2019 due to low Hgb (Pt received 1 unit of PRBCs). GI performed an upper GI endoscopy where they found no evidence of a patent sphincterotomy in the ampulla without ulceration or evidence of recent bleeding/oozing.  INR of 2.1 is therapeutic and slowly increasing. Hgb decreased from 8.3 to 7.8. Plt wnl. Per nursing no bleeding noted.    Goal of Therapy:  INR 2-3 Monitor platelets by anticoagulation protocol: Yes   Plan:  Warfarin 7.5mg  x1  Monitor daily INR, CBC, and S/S of bleeding    Cristela Felt, PharmD PGY1 Pharmacy Resident [employer]: [phone]  07/08/2019 2:23 PM

## 2019-07-09 NOTE — Progress Notes (Signed)
Physical Therapy Treatment Patient Details Name: [name] MRN: [id number] DOB: 12-12-1940 Today's Date: 07/09/2019    History of Present Illness Pt is a 78 y.o. M with HTN, sCHF EF 25%, CKD III baseline Cr 2, DM, PVD and alcohol use who presented 06/18/19 with 4-5 days progressive abdominal pain with vomiting.  Found to have choledocholithiasis and underwent ERCP.  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    PT Comments    Patient is making progress toward PT goals and tolerated increased mobility well. VSS. Pt will continue to benefit from further skilled PT services to maximize independence and safety with mobility.    Follow Up Recommendations  Home health PT;Supervision/Assistance - 24 hour     Equipment Recommendations  None recommended by PT    Recommendations for Other Services       Precautions / Restrictions Precautions Precautions: Fall Restrictions Weight Bearing Restrictions: No    Mobility  Bed Mobility Overal bed mobility: Needs Assistance Bed Mobility: Supine to Sit;Sit to Supine     Supine to sit: Supervision Sit to supine: Supervision   General bed mobility comments: supervision for safety; increased time and effort  Transfers Overall transfer level: Needs assistance Equipment used: Rolling walker (2 wheeled) Transfers: Sit to/from Stand Sit to Stand: Supervision         General transfer comment: supervision for safety; pt demonstrates safe hand placement  Ambulation/Gait Ambulation/Gait assistance: Supervision Gait Distance (Feet): 250 Feet Assistive device: Rolling walker (2 wheeled) Gait Pattern/deviations: Step-through pattern     General Gait Details: step through pattern with functional gait speed   Stairs             Wheelchair Mobility    Modified Rankin (Stroke Patients Only)       Balance Overall balance assessment: Needs assistance Sitting-balance support: Feet supported Sitting  balance-Leahy Scale: Good     Standing balance support: Bilateral upper extremity supported Standing balance-Leahy Scale: Good Standing balance comment: supervision with unilateral UE support                            Cognition Arousal/Alertness: Awake/alert Behavior During Therapy: WFL for tasks assessed/performed Overall Cognitive Status: Within Functional Limits for tasks assessed                                        Exercises      General Comments General comments (skin integrity, edema, etc.): VSS; discussed walking program and reviewed weighing daily       Pertinent Vitals/Pain Pain Assessment: No/denies pain    Home Living                      Prior Function            PT Goals (current goals can now be found in the care plan section) Progress towards PT goals: Progressing toward goals    Frequency    Min 3X/week      PT Plan Current plan remains appropriate    Co-evaluation              AM-PAC PT "6 Clicks" Mobility   Outcome Measure  Help needed turning from your back to your side while in a flat bed without using bedrails?: None Help needed moving from lying on your back to sitting on  the side of a flat bed without using bedrails?: None Help needed moving to and from a bed to a chair (including a wheelchair)?: None Help needed standing up from a chair using your arms (e.g., wheelchair or bedside chair)?: None Help needed to walk in hospital room?: None Help needed climbing 3-5 steps with a railing? : A Little 6 Click Score: 23    End of Session Equipment Utilized During Treatment: Gait belt Activity Tolerance: Patient tolerated treatment well Patient left: in bed;with call bell/phone within reach;with bed alarm set Nurse Communication: Mobility status PT Visit Diagnosis: Other abnormalities of gait and mobility (R26.89);Muscle weakness (generalized) (M62.81)     Time: 3612-2449 PT Time  Calculation (min) (ACUTE ONLY): 21 min  Charges:  $Gait Training: 8-22 mins                     Earney Navy, PTA Acute Rehabilitation Services Pager: [phone] Office: [phone]     Darliss Cheney 07/09/2019, 10:03 AM

## 2019-07-09 NOTE — TOC Transition Note (Signed)
Transition of Care [facility]) - CM/SW Discharge Note   Patient Details  Name: [name] MRN: [id number] Date of Birth: Jun 20, 1941  Transition of Care [facility]) CM/SW Contact:  Wende Neighbors, LCSW Phone Number: 07/09/2019, 12:05 PM   Clinical Narrative:  Patient stated he is agreeable to go home with [facility] following. CSW gave patient a list of agency to choice from but patient stated they all do the same things and he did not care which agency followed him in the home as long as it is covered by his medicare policy. CSW set patient up with [facility] for HHPT and HHRN. Agency aware patient is discharging today. Patient stated he needs assistance to go home and is agreeable with a cab voucher. Patient stated he is comfortable riding a cab on his own and has access to his home. CSW assisted patient with [employer] application per his request. [employer] fax paperwork to [facility] and encouraged the patient to follow up with application out in the community.   Cab voucher was given to RN to call once patient is ready for discharge          Final next level of care: Home w Home Health Services Barriers to Discharge: No Barriers Identified   Patient Goals and CMS Choice        Discharge Placement                Patient to be transferred to facility by: cab Name of family member notified: patient is cognitive x4 Patient and family notified of of transfer: 07/09/19  Discharge Plan and Services                                     Social Determinants of Health (SDOH) Interventions     Readmission Risk Interventions No flowsheet data found.

## 2019-07-09 NOTE — Progress Notes (Addendum)
Patient ID: [name], male   DOB: 08-14-1941, 78 y.o.   MRN: [id number]    Advanced Heart Failure Rounding Note   Subjective:    S/P scope-->  No gross lesions in esophagus proximally. LA Grade C esophagitis distally. 3 cm hiatal hernia.Erythematous mucosa in the gastric body and antrum. - Non-bleeding gastric ulcers with a clean ulcer base (Forrest Class III).  Hgb  7.8 today. No bleeding.    Wt increased yesterday by 2 lb from 165>>167 lb. Torsemide increased to 40 mg. -3L out yesterday. Net I/Os since admit -28 L total. Wt back down to 165 lb today. Creatinine stable at 2.02. denies resting and exertional dyspnea. No supplemental O2 requirements.   NSVT on tele overnight. 11 beat run and 16 beat run. Asymptomatic. Sinus tach low 100s currently.  Mg level low yesterday at 1.6. Supplementation given. No f/u labs obtained today.    Objective:   Weight Range:  Vital Signs:   Temp:  [98.2 F (36.8 C)-98.6 F (37 C)] 98.4 F (36.9 C) (11/09 0018) Pulse Rate:  [99-113] 101 (11/09 0415) Resp:  [16-24] 19 (11/09 0500) BP: (99-112)/(61-72) 102/66 (11/09 0415) SpO2:  [98 %-100 %] 98 % (11/09 0415) Weight:  [74.8 kg] 74.8 kg (11/09 0442) Last BM Date: 07/08/19  Weight change: Filed Weights   07/07/19 0349 07/08/19 0357 07/09/19 0442  Weight: 75.2 kg 76.1 kg 74.8 kg    Intake/Output:   Intake/Output Summary (Last 24 hours) at 07/09/2019 0743 Last data filed at 07/09/2019 0600 Gross per 24 hour  Intake -  Output 3185 ml  Net -3185 ml   PHYSICAL EXAM: General:  Elderly AAM, well appearing. No respiratory difficulty HEENT: normal Neck: supple. no JVD. Carotids 2+ bilat; no bruits. No lymphadenopathy or thyromegaly appreciated. Cor: PMI nondisplaced. Regular rhythm, tachy rate. No rubs, gallops or murmurs. Lungs: clear Abdomen: soft, nontender, nondistended. No hepatosplenomegaly. No bruits or masses. Good bowel sounds. Extremities: no cyanosis, clubbing, rash, edema  Neuro: alert & oriented x 3, cranial nerves grossly intact. moves all 4 extremities w/o difficulty. Affect pleasant.  Telemetry: Sinus tach 100-110 Personally reviewed.   Labs: Basic Metabolic Panel: Recent Labs  Lab 07/04/19 0500 07/05/19 0500 07/06/19 0309 07/07/19 0435 07/08/19 0425  NA 137 137 134* 135 136  K 4.5 4.1 4.1 3.8 3.9  CL 101 102 102 101 103  CO2 28 27 25 26 26   GLUCOSE 90 89 94 106* 91  BUN 41* 40* 39* 39* 42*  CREATININE 2.09* 2.19* 2.13* 2.05* 2.02*  CALCIUM 8.5* 8.3* 8.2* 8.3* 8.3*  MG  --   --  1.8 1.8 1.6*  PHOS  --   --  3.2 3.3 3.3    Liver Function Tests: Recent Labs  Lab 07/07/19 0435 07/08/19 0425  AST 35 30  ALT 31 29  ALKPHOS 64 64  BILITOT 2.9* 2.7*  PROT 6.8 6.8  ALBUMIN 2.0* 1.9*   No results for input(s): LIPASE, AMYLASE in the last 168 hours. No results for input(s): AMMONIA in the last 168 hours.  CBC: Recent Labs  Lab 07/05/19 0500 07/06/19 0309 07/06/19 2200 07/07/19 0435 07/08/19 0425  WBC 7.7 7.1 6.9 7.4 7.8  HGB 7.1* 7.1* 8.1* 8.3* 7.8*  HCT 22.9* 22.2* 25.9* 26.6* 25.2*  MCV 97.4 94.9 97.7 97.4 96.9  PLT 424* 411* 401* 403* 372    Cardiac Enzymes: No results for input(s): CKTOTAL, CKMB, CKMBINDEX, TROPONINI in the last 168 hours.  BNP: BNP (last 3 results) Recent Labs  08/16/18 0940 02/04/19 0906 06/18/19 2109  BNP 1,774.3* >4,500.0* 2,536.0*    ProBNP (last 3 results) No results for input(s): PROBNP in the last 8760 hours.    Other results:  Imaging: No results found.   Medications:     Scheduled Medications: . carvedilol  3.125 mg Oral BID WC  . Chlorhexidine Gluconate Cloth  6 each Topical Daily  . feeding supplement (ENSURE ENLIVE)  237 mL Oral BID BM  . finasteride  5 mg Oral Daily  . isosorbide-hydrALAZINE  0.5 tablet Oral TID  . multivitamin with minerals  1 tablet Oral Daily  . pantoprazole  40 mg Oral BID AC  . polycarbophil  625 mg Oral Daily  . sevelamer carbonate  800 mg  Oral TID WC  . simethicone  160 mg Oral TID AC & HS  . tamsulosin  0.4 mg Oral Daily  . torsemide  40 mg Oral Daily  . Warfarin - Pharmacist Dosing Inpatient   Does not apply q1800    Infusions:   PRN Medications: alum & mag hydroxide-simeth, ondansetron (ZOFRAN) IV, polyethylene glycol, sodium chloride flush   Assessment/Plan:   1. 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 Diureses and renal function improved w/ inotropic therapy.  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 Previously declined cath. He does, of note, have anterior Qs on ECG.  Not a LHC candidate due to baseline renal function. Echo this admission showed EF < 20%, severe LV dilation, small LV apical thrombus, severely dilated and dysfunctional RV with flattened septum. Dobutamine started on 10/23 and discontinued 10/28 with stable co-ox off dobutamine. Massive diuresis this admit, -28L total.  - Euvolemic on exam. Continue torsemide 40 mg daily  - Continue low dose carvedilol 3.125 mg twice a day.  - No ARB/ARNI or digoxin due to AKI this admission.   - Continue low dose Bidil 1/2 tab tid, BP too low to increase.      2. AKI: On CKD stage 3.  Initial creatinine 2.8, peaked 5.16.  Stable now at 2.0 -2.11. Suspect cardiorenal syndrome likely with ATN.  Baseline creatinine was 1.5 in 1/20 and 2.5 in 6/20. Dialysis would be very difficult for him with severe biventricular failure.  3. LV thrombus: warfarin started 10/27, INR 2.1 - Off heparin.  - Continue warfarin. PharmD dosing. 4. Ascending cholangitis: S/p ERCP and stone removal. Abdominal US 10/30 did not show acute cholecystitis. He is off antibiotics.  5. Hyperbilirubinemia: Likely due to combination of CBD obstruction (now relieved) and RV failure with hepatic congestion/cirrhosis.  6. Hypokalemia: Resolved. K 3.9 today 7. Anemia:  Had dark stool 10/31,  given 1 unit PRBCs 10/31 with hgb down to 6.7.  Hgb 7.1  Yesterday. Given 1u RBC now 8.3  EGD negative for active bleeding. Non-bleeding ulcers noted. Has had feraheme.  - Now on oral PPI twice a day. - Transfuse hgb < 7.  8. NSVT: In the setting of severe LV dysfunction. Keep K and Mg in normal range.   Stable for discharge from HF standpoint.   D/c HF meds  Warfarin Torsemide 40 daily Bidil 1/2 tablet tid Carvedilol 3.125 bid   F/u HF Clinic and coumadin clinic arranged.   Lyda Jester, PA-C 07/09/2019 7:43 AM   Agree with the above PA note.   Patient stable today, ready for discharge on the above medicine regimen. We will arrange for close followup in CHF  clinic.   Loralie Champagne 07/09/2019 8:44 AM

## 2019-07-09 NOTE — Progress Notes (Signed)
CARDIAC REHAB PHASE I   HF completed with pt. Pt given HF booklet. Reviewed importance of daily weight. Encouraged mobility with emphasis on safety. Pt anxious to go home.  [phone] Rufina Falco, RN BSN 07/09/2019 9:11 AM

## 2019-07-09 NOTE — Care Management Important Message (Signed)
Important Message  Patient Details  Name: [name] MRN: [id number] Date of Birth: 1940/10/13   Medicare Important Message Given:  Yes     [name] 07/09/2019, 12:55 PM

## 2019-07-09 NOTE — Progress Notes (Signed)
Discharge AVS meds take and those due reviewed with pt. Follow up appointments and when to call MD reviewed. All questions and concerns addressed. No further questions at this time. D/c IV and TELE, CCMD notified. D/C home per orders. Cab called for pt, brought down via wheelchair with volunteers with all belongings.  Amanda Cockayne, RN

## 2019-07-12 ENCOUNTER — Encounter (HOSPITAL_COMMUNITY): Payer: Medicare Other

## 2019-10-11 ENCOUNTER — Other Ambulatory Visit: Payer: Self-pay

## 2019-10-11 ENCOUNTER — Encounter (HOSPITAL_BASED_OUTPATIENT_CLINIC_OR_DEPARTMENT_OTHER): Payer: Medicare Other | Attending: Internal Medicine | Admitting: Internal Medicine

## 2019-10-11 DIAGNOSIS — I509 Heart failure, unspecified: Secondary | ICD-10-CM | POA: Diagnosis not present

## 2019-10-11 DIAGNOSIS — Z823 Family history of stroke: Secondary | ICD-10-CM | POA: Diagnosis not present

## 2019-10-11 DIAGNOSIS — I890 Lymphedema, not elsewhere classified: Secondary | ICD-10-CM | POA: Insufficient documentation

## 2019-10-11 DIAGNOSIS — E1151 Type 2 diabetes mellitus with diabetic peripheral angiopathy without gangrene: Secondary | ICD-10-CM | POA: Insufficient documentation

## 2019-10-11 DIAGNOSIS — D631 Anemia in chronic kidney disease: Secondary | ICD-10-CM | POA: Insufficient documentation

## 2019-10-11 DIAGNOSIS — L97522 Non-pressure chronic ulcer of other part of left foot with fat layer exposed: Secondary | ICD-10-CM | POA: Diagnosis not present

## 2019-10-11 DIAGNOSIS — L97112 Non-pressure chronic ulcer of right thigh with fat layer exposed: Secondary | ICD-10-CM | POA: Diagnosis not present

## 2019-10-11 DIAGNOSIS — I429 Cardiomyopathy, unspecified: Secondary | ICD-10-CM | POA: Insufficient documentation

## 2019-10-11 DIAGNOSIS — E11621 Type 2 diabetes mellitus with foot ulcer: Secondary | ICD-10-CM | POA: Insufficient documentation

## 2019-10-11 DIAGNOSIS — Z7901 Long term (current) use of anticoagulants: Secondary | ICD-10-CM | POA: Diagnosis not present

## 2019-10-11 DIAGNOSIS — Z8249 Family history of ischemic heart disease and other diseases of the circulatory system: Secondary | ICD-10-CM | POA: Diagnosis not present

## 2019-10-11 DIAGNOSIS — Z87891 Personal history of nicotine dependence: Secondary | ICD-10-CM | POA: Insufficient documentation

## 2019-10-11 DIAGNOSIS — N186 End stage renal disease: Secondary | ICD-10-CM | POA: Diagnosis not present

## 2019-10-11 DIAGNOSIS — L97822 Non-pressure chronic ulcer of other part of left lower leg with fat layer exposed: Secondary | ICD-10-CM | POA: Diagnosis not present

## 2019-10-11 DIAGNOSIS — I132 Hypertensive heart and chronic kidney disease with heart failure and with stage 5 chronic kidney disease, or end stage renal disease: Secondary | ICD-10-CM | POA: Diagnosis not present

## 2019-10-11 DIAGNOSIS — E1122 Type 2 diabetes mellitus with diabetic chronic kidney disease: Secondary | ICD-10-CM | POA: Insufficient documentation

## 2019-10-11 NOTE — Progress Notes (Signed)
[name], [name] ([id number]) Visit Report for 10/11/2019 Abuse/Suicide Risk Screen Details Patient Name: Date of Service: [name], [name] 10/11/2019 9:00 AM Medical Record [id number] Patient Account Number: [id number] Date of Birth/Sex: Treating RN: 05-15-1941 (79 y.o. [name] Primary Care Dmonte Maher: Glennon Mac., ROBERT Other Clinician: Referring Kristena Wilhelmi: Treating Shalin Vonbargen/Extender:Robson, Pamella Pert., ROBERT Weeks in Treatment: 0 Abuse/Suicide Risk Screen Items Answer ABUSE RISK SCREEN: Has anyone close to you tried to hurt or harm you recentlyo No Do you feel uncomfortable with anyone in your familyo No Has anyone forced you do things that you didnt want to doo No Electronic Signature(s) Signed: 10/11/2019 5:22:04 PM By: Kela Millin Entered By: Kela Millin on 10/11/2019 09:23:23 -------------------------------------------------------------------------------- Activities of Daily Living Details Patient Name: Date of Service: [name], [name] 10/11/2019 9:00 AM Medical Record [id number] Patient Account Number: [id number] Date of Birth/Sex: Treating RN: 10-Feb-1941 (79 y.o. [name] Primary Care Quisha Mabie: Glennon Mac., ROBERT Other Clinician: Referring Bernarr Longsworth: Treating Kerriann Kamphuis/Extender:Robson, Pamella Pert., ROBERT Weeks in Treatment: 0 Activities of Daily Living Items Answer Activities of Daily Living (Please select one for each item) Drive Automobile Not Able Take Medications Completely Able Use Telephone Completely Able Care for Appearance Completely Able Use Toilet Completely Able Bath / Shower Completely Able Dress Self Completely Able Feed Self Completely Able Walk Need Assistance Get In / Out Bed Completely Able Housework Need Assistance Prepare Meals Completely [place] for Self Completely Able Electronic Signature(s) Signed: 10/11/2019 5:22:04 PM By: Kela Millin Entered By: Kela Millin on 10/11/2019 09:24:19 -------------------------------------------------------------------------------- Education Screening Details Patient Name: Date of Service: [name], [name] 10/11/2019 9:00 AM Medical Record [id number] Patient Account Number: [id number] Date of Birth/Sex: Treating RN: 10/28/1940 (78 y.o. [name] Primary Care Jude Naclerio: Glennon Mac., ROBERT Other Clinician: Referring Amandy Chubbuck: Treating Nelsie Domino/Extender:Robson, Pamella Pert., Tacy Learn in Treatment: 0 Learning Preferences/Education Level/Primary Language Learning Preference: Explanation Highest Education Level: High School Preferred Language: English Cognitive Barrier Language Barrier: No Translator Needed: No Memory Deficit: No Emotional Barrier: No Cultural/Religious Beliefs Affecting Medical Care: No Physical Barrier Impaired Vision: Yes Legally Blind, in right eye Impaired Hearing: No Decreased Hand dexterity: No Knowledge/Comprehension Knowledge Level: Medium Comprehension Level: Medium Ability to understand written Medium instructions: Ability to understand verbal Medium instructions: Motivation Anxiety Level: Calm Cooperation: Cooperative Education Importance: Acknowledges Need Interest in Health Problems: Asks Questions Perception: Coherent Willingness to Engage in Self- High Management Activities: Readiness to Engage in Self- High Management Activities: Electronic Signature(s) Signed: 10/11/2019 5:22:04 PM By: Kela Millin Entered By: Kela Millin on 10/11/2019 09:25:02 -------------------------------------------------------------------------------- Fall Risk Assessment Details Patient Name: Date of Service: [name], [name] 10/11/2019 9:00 AM Medical Record [id number] Patient Account Number: [id number] Date of Birth/Sex: Treating RN: 1941-01-28 (79 y.o. [name] Primary Care Barkley Kratochvil:  Glennon Mac., ROBERT Other Clinician: Referring Myrth Dahan: Treating Lavonne Kinderman/Extender:Robson, Pamella Pert., ROBERT Weeks in Treatment: 0 Fall Risk Assessment Items Have you had 2 or more falls in the last 12 monthso 0 Yes Have you had any fall that resulted in injury in the last 12 monthso 0 No FALLS RISK SCREEN History of falling - immediate or within 3 months 25 Yes Secondary diagnosis (Do you have 2 or more medical diagnoseso) 15 Yes Ambulatory aid None/bed rest/wheelchair/nurse 0 No Crutches/cane/walker 0 No Furniture 30 Yes Intravenous therapy Access/Saline/Heparin Lock 0 No Weak (short steps with or without shuffle, stooped but able to lift head 10 Yes while walking, may seek support from furniture) Impaired (short steps with shuffle, may have  difficulty arising from chair, 0 No head down, impaired balance) Mental Status Oriented to own ability 0 Yes Overestimates or forgets limitations 0 No Risk Level: High Risk Score: 80 Electronic Signature(s) Signed: 10/11/2019 5:22:04 PM By: Kela Millin Entered By: Kela Millin on 10/11/2019 09:25:52 -------------------------------------------------------------------------------- Foot Assessment Details Patient Name: Date of Service: [name], [name] 10/11/2019 9:00 AM Medical Record [id number] Patient Account Number: [id number] Date of Birth/Sex: Treating RN: December 19, 1940 (79 y.o. [name] Primary Care Tyrann Donaho: Glennon Mac., ROBERT Other Clinician: Referring Kiowa Hollar: Treating Donn Wilmot/Extender:Robson, Pamella Pert., ROBERT Weeks in Treatment: 0 Foot Assessment Items Site Locations + = Sensation present, - = Sensation absent, C = Callus, U = Ulcer R = Redness, W = Warmth, M = Maceration, PU = Pre-ulcerative lesion F = Fissure, S = Swelling, D = Dryness Assessment Right: Left: Other Deformity: No No Prior Foot Ulcer: No No Prior Amputation: No No Charcot Joint: No No Ambulatory Status:  Ambulatory With Help Assistance Device: Cane Gait: Buyer, retail Signature(s) Signed: 10/11/2019 5:22:04 PM By: Kela Millin Entered By: Kela Millin on 10/11/2019 09:26:30 -------------------------------------------------------------------------------- Nutrition Risk Screening Details Patient Name: Date of Service: [name], [name] 10/11/2019 9:00 AM Medical Record [id number] Patient Account Number: [id number] Date of Birth/Sex: Treating RN: 03/27/1941 (79 y.o. [name] Primary Care Deaundra Kutzer: Glennon Mac., ROBERT Other Clinician: Referring Janely Gullickson: Treating Emilio Baylock/Extender:Robson, Pamella Pert., ROBERT Weeks in Treatment: 0 Height (in): 71 Weight (lbs): 198 Body Mass Index (BMI): 27.6 Nutrition Risk Screening Items Score Screening NUTRITION RISK SCREEN: I have an illness or condition that made me change the kind and/or 0 No amount of food I eat I eat fewer than two meals per day 0 No I eat few fruits and vegetables, or milk products 0 No I have three or more drinks of beer, liquor or wine almost every day 0 No I have tooth or mouth problems that make it hard for me to eat 0 No I don't always have enough money to buy the food I need 0 No I eat alone most of the time 0 No I take three or more different prescribed or over-the-counter drugs a day 1 Yes 2 Yes Without wanting to, I have lost or gained 10 pounds in the last six months I am not always physically able to shop, cook and/or feed myself 0 No Nutrition Protocols Good Risk Protocol Provide education on Moderate Risk Protocol 0 nutrition High Risk Proctocol Risk Level: Moderate Risk Score: 3 Electronic Signature(s) Signed: 10/11/2019 5:22:04 PM By: Kela Millin Entered By: Kela Millin on 10/11/2019 09:26:16

## 2019-10-12 NOTE — Progress Notes (Signed)
EXAVIOR, KIMMONS ([id number]) Visit Report for 10/11/2019 Allergy List Details Patient Name: Date of Service: LENWOOD, BALSAM 10/11/2019 9:00 AM Medical Record [id number] Patient Account Number: [id number] Date of Birth/Sex: Treating RN: 08-24-1941 (79 y.o. [name] Primary Care Lemya Greenwell: Glennon Mac., ROBERT Other Clinician: Referring Ayomide Purdy: Treating Mayerly Kaman/Extender:Robson, Pamella Pert., ROBERT Weeks in Treatment: 0 Allergies Active Allergies No Known Allergies Allergy Notes Electronic Signature(s) Signed: 10/11/2019 5:22:04 PM By: Kela Millin Entered By: Kela Millin on 10/11/2019 09:15:28 -------------------------------------------------------------------------------- Arrival Information Details Patient Name: Date of Service: SUDEEP, SCHEIBEL 10/11/2019 9:00 AM Medical Record [id number] Patient Account Number: [id number] Date of Birth/Sex: Treating RN: May 11, 1941 (79 y.o. [name] Primary Care Schae Cando: Glennon Mac., ROBERT Other Clinician: Referring Anahlia Iseminger: Treating Zekiel Torian/Extender:Robson, Pamella Pert., Tacy Learn in Treatment: 0 Visit Information Patient Arrived: Cane Arrival Time: 09:09 Accompanied By: self Transfer Assistance: None Patient Identification Verified: Yes Secondary Verification Process Yes Completed: Patient Has Alerts: Yes Patient Alerts: Patient on Blood Thinner Electronic Signature(s) Signed: 10/11/2019 5:22:04 PM By: Kela Millin Entered By: Kela Millin on 10/11/2019 09:10:34 -------------------------------------------------------------------------------- Clinic Level of Care Assessment Details Patient Name: Date of Service: STILLMAN, BUENGER 10/11/2019 9:00 AM Medical Record [id number] Patient Account Number: [id number] Date of Birth/Sex: Treating RN: [birth year] (79 y.o. [name] Reznor Ferrando: Glennon Mac., ROBERT Other Clinician: Referring  Ruey Storer: Treating Rhonda Linan/Extender:Robson, Pamella Pert., ROBERT Weeks in Treatment: 0 Clinic Level of Care Assessment Items TOOL 1 Quantity Score X - Use when EandM and Procedure is performed on INITIAL visit 1 0 ASSESSMENTS - Nursing Assessment / Reassessment X - General Physical Exam (combine w/ comprehensive assessment (listed just below) 1 20 when performed on new pt. evals) X - Comprehensive Assessment (HX, ROS, Risk Assessments, Wounds Hx, etc.) 1 25 ASSESSMENTS - Wound and Skin Assessment / Reassessment X - Dermatologic / Skin Assessment (not related to wound area) 1 10 ASSESSMENTS - Ostomy and/or Continence Assessment and Care []  - Incontinence Assessment and Management 0 []  - Ostomy Care Assessment and Management (repouching, etc.) 0 PROCESS - Coordination of Care []  - Simple Patient / Family Education for ongoing care 0 X - Complex (extensive) Patient / Family Education for ongoing care 1 20 X - Staff obtains Programmer, systems, Records, Test Results / Process Orders 1 10 X - Staff telephones HHA, Nursing Homes / Clarify orders / etc 1 10 []  - Routine Transfer to another Facility (non-emergent condition) 0 []  - Routine Hospital Admission (non-emergent condition) 0 X - New Admissions / Biomedical engineer / Ordering NPWT, Apligraf, etc. 1 15 []  - Emergency Hospital Admission (emergent condition) 0 PROCESS - Special Needs []  - Pediatric / Minor Patient Management 0 []  - Isolation Patient Management 0 []  - Hearing / Language / Visual special needs 0 []  - Assessment of Community assistance (transportation, D/C planning, etc.) 0 []  - Additional assistance / Altered mentation 0 []  - Support Surface(s) Assessment (bed, cushion, seat, etc.) 0 INTERVENTIONS - Miscellaneous []  - External ear exam 0 []  - Patient Transfer (multiple staff / Civil Service fast streamer / Similar devices) 0 []  - Simple Staple / Suture removal (25 or less) 0 []  - Complex Staple / Suture removal (26 or more) 0 []  -  Hypo/Hyperglycemic Management (do not check if billed separately) 0 X - Ankle / Brachial Index (ABI) - do not check if billed separately 1 15 Has the patient been seen at the hospital within the last three years: Yes Total Score: 125 Level Of Care: New/Established - Level 4 Electronic Signature(s) Signed:  10/11/2019 5:57:11 PM By: Deon Pilling Entered By: Deon Pilling on 10/11/2019 10:30:21 -------------------------------------------------------------------------------- Encounter Discharge Information Details Patient Name: Date of Service: JOSHAUA, EPPLE 10/11/2019 9:00 AM Medical Record [id number] Patient Account Number: [id number] Date of Birth/Sex: Treating RN: Oct 03, 1940 (78 y.o. [name] Primary Care Twana Wileman: Glennon Mac., ROBERT Other Clinician: Referring Aleeta Schmaltz: Treating [name] Oldaker/Extender:Robson, Pamella Pert., ROBERT Weeks in Treatment: 0 Encounter Discharge Information Items Post Procedure Vitals Discharge Condition: Stable Temperature (F): 97.7 Ambulatory Status: Cane Pulse (bpm): 87 Discharge Destination: Home Respiratory Rate (breaths/min): 18 Transportation: Private Auto Blood Pressure (mmHg): 141/80 Accompanied By: self Schedule Follow-up Appointment: Yes Clinical Summary of Care: Patient Declined Electronic Signature(s) Signed: 10/12/2019 5:30:09 PM By: Baruch Gouty RN, BSN Entered By: Baruch Gouty on 10/11/2019 10:50:56 -------------------------------------------------------------------------------- Lower Extremity Assessment Details Patient Name: Date of Service: AZIAH, BROSTROM 10/11/2019 9:00 AM Medical Record [id number] Patient Account Number: [id number] Date of Birth/Sex: Treating RN: May 21, 1941 (78 y.o. [name] Primary Care Kadyn Guild: Glennon Mac., ROBERT Other Clinician: Referring Nuvia Hileman: Treating Shakirra Buehler/Extender:Robson, Pamella Pert., ROBERT Weeks in Treatment: 0 Edema Assessment Assessed:  [Left: No] [Right: No] Edema: [Left: Yes] [Right: Yes] Calf Left: Right: Point of Measurement: 43 cm From Medial Instep 37.523 cm 38 cm Ankle Left: Right: Point of Measurement: 14 cm From Medial Instep 23 cm 23 cm Vascular Assessment Pulses: Dorsalis Pedis Palpable: [Left:Yes] [Right:Yes] Blood Pressure: Brachial: [Left:141] [Right:141] Ankle: [Left:Dorsalis Pedis: 160 1.13] [Right:Dorsalis Pedis: 164 1.16] Electronic Signature(s) Signed: 10/11/2019 5:22:04 PM By: Kela Millin Entered By: Kela Millin on 10/11/2019 09:41:53 -------------------------------------------------------------------------------- Multi Wound Chart Details Patient Name: Date of Service: DELMAS, FAUCETT 10/11/2019 9:00 AM Medical Record [id number] Patient Account Number: [id number] Date of Birth/Sex: Treating RN: 27-Jul-1941 (79 y.o. [name] Shaniqwa Horsman: Glennon Mac., ROBERT Other Clinician: Referring Wrigley Winborne: Treating Hillary Struss/Extender:Robson, Pamella Pert., ROBERT Weeks in Treatment: 0 Vital Signs Height(in): 71 Pulse(bpm): 74 Weight(lbs): 198 Blood Pressure(mmHg): 141/80 Body Mass Index(BMI): 28 Temperature(F): 97.7 Respiratory 19 Rate(breaths/min): Photos: [1:No Photos] [2:No Photos] [3:No Photos] Wound Location: [1:Left Lower Leg - Lateral] [2:Left Lower Leg - Anterior, Right, Proximal, Lateral Distal] [3:Lower Leg] Wounding Event: [1:Blister] [2:Blister] [3:Blister] Primary Etiology: [1:Venous Leg Ulcer] [2:Venous Leg Ulcer] [3:Venous Leg Ulcer] Comorbid History: [1:Anemia, Congestive Heart Anemia, Congestive Heart Anemia, Congestive Heart Failure, Hypertension, Peripheral Venous Disease, Peripheral Venous Disease, Peripheral Venous Disease, 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 End  Stage Renal Disease] [2:Failure, Hypertension,] [3:Failure, Hypertension,] Date Acquired: [1:09/11/2019] [2:09/11/2019] [3:09/11/2019] Weeks of Treatment: [1:0]  [2:0] [3:0] Wound Status: [1:Open] [2:Open] [3:Open] Clustered Wound: [1:Yes] [2:No] [3:No] Clustered Quantity: [1:5] [2:N/A] [3:N/A] Measurements L x W x D 7.9x9.5x0.1 [2:0.4x0.4x0.1] [3:3x2.1x0.1] (cm) Area (cm) : [1:58.944] [2:0.126] [3:4.948] Volume (cm) : [1:5.894] [2:0.013] [3:0.495] % Reduction in Area: [1:0.00%] [2:0.00%] [3:0.00%] % Reduction in Volume: 0.00% [2:0.00%] [3:0.00%] Classification: [1:Full Thickness Without Exposed Support Structures Exposed Support Structures Exposed Support Structures] [2:Full Thickness Without] [3:Full Thickness Without] Exudate Amount: [1:Medium] [2:Small] [3:Medium] Exudate Type: [1:Serous] [2:Serous] [3:Serous] Exudate Color: [1:amber] [2:amber] [3:amber] Wound Margin: [1:Distinct, outline attached Distinct, outline attached Distinct, outline attached] Granulation Amount: [1:Small (1-33%)] [2:Large (67-100%)] [3:Medium (34-66%)] Granulation Quality: [1:Red, Pink] [2:Pink] [3:Red, Pink] Necrotic Amount: [1:Large (67-100%)] [2:None Present (0%)] [3:Medium (34-66%)] Necrotic Tissue: [1:Adherent Slough] [2:N/A] [3:Adherent Slough] Exposed Structures: [1:Fat Layer (Subcutaneous Fat Layer (Subcutaneous Fat Layer (Subcutaneous Tissue) Exposed: Yes Fascia: No Tendon: No Muscle: No Joint: No Bone: No] [2:Tissue) Exposed: Yes Fascia: No Tendon: No Muscle: No Joint: No Bone: No] [3:Tissue) Exposed: Yes  Fascia: No Tendon:  No Muscle: No Joint: No Bone: No] Epithelialization: [1:Small (1-33%)] [2:None] [3:None] Debridement: [1:Debridement - Excisional N/A] [3:Debridement - Excisional] Pre-procedure [1:10:10] [2:N/A] [3:10:10] Verification/Time Out Taken: Pain Control: [1:Lidocaine 4% Topical Solution] [2:N/A] [3:Lidocaine 4% Topical Solution] Tissue Debrided: [1:Subcutaneous, Slough] [2:N/A] [3:Subcutaneous, Slough] Level: [1:Skin/Subcutaneous Tissue N/A] [3:Skin/Subcutaneous Tissue] Debridement Area (sq cm):75.05 [2:N/A] [3:6.3] Instrument:  [1:Curette] [2:N/A] [3:Curette] Bleeding: [1:Minimum] [2:N/A] [3:Minimum] Hemostasis Achieved: [1:Pressure] [2:N/A] [3:Pressure] Procedural Pain: [1:0] [2:N/A] [3:0] Post Procedural Pain: [1:0] [2:N/A] [3:0] Debridement Treatment [1:Procedure was tolerated] [2:N/A] [3:Procedure was tolerated] Response: [1:well] [3:well] Post Debridement [1:7.9x9.5x0.1] [2:N/A] [3:3x2.1x0.1] Measurements L x W x D (cm) Post Debridement [1:5.894] [2:N/A] [3:0.495] Volume: (cm) Procedures Performed: [1:Debridement 4] [2:N/A] [3:Debridement 5 N/A] Photos: [1:No Photos] [2:No Photos] [3:N/A] Wound Location: [1:Right, Distal, Lateral Lower Medial Metatarsal head first N/A Leg] Wounding Event: [1:Blister] [2:Not Known] [3:N/A] Primary Etiology: [1:Venous Leg Ulcer] [2:Trauma, Other] [3:N/A] Comorbid History: [1:Anemia, Congestive Heart Anemia, Congestive Heart N/A Failure, Hypertension, Peripheral Venous Disease, Peripheral Venous Disease, 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] [2:Failure, Hypertension,] Date Acquired: [1:09/11/2019] [2:10/11/2019] [3:N/A] Weeks of Treatment: [1:0] [2:0] [3:N/A] Wound Status: [1:Open] [2:Open] [3:N/A] Clustered Wound: [1:No] [2:No] [3:N/A] Clustered Quantity: [1:N/A] [2:N/A] [3:N/A] Measurements L x W x D 3.2x3.7x0.1 [2:0.8x0.6x0.2] [3:N/A] (cm) Area (cm) : [1:9.299] [2:0.377] [3:N/A] Volume (cm) : [1:0.93] [2:0.075] [3:N/A] % Reduction in Area: [1:0.00%] [2:0.00%] [3:N/A] % Reduction in Volume: 0.00% [2:0.00%] [3:N/A] Classification: [1:Full Thickness Without Exposed Support Structures Exposed Support Structures] [2:Full Thickness Without] [3:N/A] Exudate Amount: [1:Medium] [2:Small] [3:N/A] Exudate Type: [1:Serous] [2:Serous] [3:N/A] Exudate Color: [1:amber] [2:amber] [3:N/A] Wound Margin: [1:Distinct, outline attached Distinct, outline attached N/A] Granulation Amount: [1:Medium (34-66%)] [2:Small (1-33%)] [3:N/A] Granulation Quality: [1:Red, Pink] [2:Pink]  [3:N/A] Necrotic Amount: [1:Medium (34-66%)] [2:Large (67-100%)] [3:N/A] Necrotic Tissue: [1:Adherent Slough] [2:Eschar, Adherent Slough N/A] Exposed Structures: [1:Fat Layer (Subcutaneous Fascia: No Tissue) Exposed: Yes Fascia: No Tendon: No Muscle: No Joint: No Bone: No] [2:Fat Layer (Subcutaneous Tissue) Exposed: No Tendon: No Muscle: No Joint: No Bone: No] [3:N/A] Epithelialization: [1:None] [2:N/A] [3:N/A] Debridement: [1:N/A] [2:Debridement - Excisional N/A] Pre-procedure [1:N/A] [2:10:10] [3:N/A] Verification/Time Out Taken: Pain Control: [1:N/A] [2:Lidocaine 4% Topical Solution] [3:N/A] Tissue Debrided: [1:N/A] [2:Subcutaneous, Slough] [3:N/A] Level: [1:N/A] [2:Skin/Subcutaneous Tissue N/A] Debridement Area (sq cm):N/A [2:1] [3:N/A] Instrument: [1:N/A] [2:Curette] [3:N/A] Bleeding: [1:N/A] [2:Minimum] [3:N/A] Hemostasis Achieved: [1:N/A] [2:Pressure] [3:N/A] Procedural Pain: [1:N/A] [2:0] [3:N/A] Post Procedural Pain: [1:N/A] [2:0] [3:N/A] Debridement Treatment [1:N/A] [2:Procedure was tolerated] [3:N/A] Response: [2:well] Post Debridement [1:N/A] [2:0.8x0.6x0.2] [3:N/A] Measurements L x W x D (cm) Post Debridement [1:N/A] [2:0.075] [3:N/A] Volume: (cm) Procedures Performed: [1:N/A] [2:Debridement] [3:N/A] Treatment Notes Electronic Signature(s) Signed: 10/11/2019 5:57:11 PM By: Deon Pilling Signed: 10/12/2019 1:06:39 PM By: Linton Ham MD Entered By: Linton Ham on 10/11/2019 10:27:59 -------------------------------------------------------------------------------- Multi-Disciplinary Care Plan Details Patient Name: Date of Service: CARLYLE, MCELRATH 10/11/2019 9:00 AM Medical Record [id number] Patient Account Number: [id number] Date of Birth/Sex: Treating RN: 18-May-1941 (79 y.o. [name] Pau Banh: Glennon Mac., ROBERT Other Clinician: Referring Teirra Carapia: Treating Deerica Waszak/Extender:Robson, Pamella Pert., ROBERT Weeks in Treatment:  0 Active Inactive Abuse / Safety / Falls / Self Care Management Nursing Diagnoses: Potential for falls Goals: Patient will remain injury free related to falls Date Initiated: 10/11/2019 Target Resolution Date: 11/29/2019 Goal Status: Active Patient/caregiver will verbalize understanding of the importance to maintain current immunizations/vaccinations Date Initiated: 10/11/2019 Target Resolution Date: 12/06/2019 Goal Status: Active Interventions: Assess self care needs on admission and as needed Provide education on fall prevention Treatment Activities: Patient referred to home  care : 10/11/2019 Notes: Nutrition Nursing Diagnoses: Potential for alteratiion in Nutrition/Potential for imbalanced nutrition Goals: Patient/caregiver agrees to and verbalizes understanding of need to obtain nutritional consultation Date Initiated: 10/11/2019 Target Resolution Date: 11/09/2019 Goal Status: Active Patient/caregiver agrees to and verbalizes understanding of need to use nutritional supplements and/or vitamins as prescribed Date Initiated: 10/11/2019 Target Resolution Date: 11/09/2019 Goal Status: Active Interventions: Assess HgA1c results as ordered upon admission and as needed Provide education on nutrition Treatment Activities: Obtain HgA1c : 10/11/2019 Patient referred to Primary Care Physician for further nutritional evaluation : 10/11/2019 Notes: Orientation to the Wound Care Program Nursing Diagnoses: Knowledge deficit related to the wound healing center program Goals: Patient/caregiver will verbalize understanding of the [facility] Date Initiated: 10/11/2019 Target Resolution Date: 11/09/2019 Goal Status: Active Interventions: Provide education on orientation to the wound center Notes: Wound/Skin Impairment Nursing Diagnoses: Knowledge deficit related to ulceration/compromised skin integrity Goals: Patient/caregiver will verbalize understanding of skin care  regimen Date Initiated: 10/11/2019 Target Resolution Date: 11/09/2019 Goal Status: Active Interventions: Assess patient/caregiver ability to obtain necessary supplies Assess patient/caregiver ability to perform ulcer/skin care regimen upon admission and as needed Assess ulceration(s) every visit Provide education on ulcer and skin care Treatment Activities: Skin care regimen initiated : 10/11/2019 Topical wound management initiated : 10/11/2019 Notes: Electronic Signature(s) Signed: 10/11/2019 5:57:11 PM By: Deon Pilling Entered By: Deon Pilling on 10/11/2019 10:07:17 -------------------------------------------------------------------------------- Pain Assessment Details Patient Name: Date of Service: DAY, DEERY 10/11/2019 9:00 AM Medical Record [id number] Patient Account Number: [id number] Date of Birth/Sex: Treating RN: March 31, 1941 (79 y.o. [name] Primary Care Omah Dewalt: Glennon Mac., ROBERT Other Clinician: Referring Bobbi Kozakiewicz: Treating Marissah Vandemark/Extender:Robson, Pamella Pert., ROBERT Weeks in Treatment: 0 Active Problems Location of Pain Severity and Description of Pain Patient Has Paino Yes Site Locations Pain Location: Pain in Ulcers With Dressing Change: Yes Duration of the Pain. Constant / Intermittento Constant Rate the pain. Current Pain Level: 6 Worst Pain Level: 8 Least Pain Level: 4 Tolerable Pain Level: 5 Character of Pain Describe the Pain: Burning, Stabbing Pain Management and Medication Current Pain Management: Electronic Signature(s) Signed: 10/11/2019 5:22:04 PM By: Kela Millin Entered By: Kela Millin on 10/11/2019 09:58:26 -------------------------------------------------------------------------------- Patient/Caregiver Education Details Patient Name: Date of Service: Rains, Saurabh 2/11/2021andnbsp9:00 AM Medical Record [id number] Patient Account Number: [id number] Date of Birth/Gender: Treating  RN: 1941/01/08 (79 y.o. [name] Physician: Glennon Mac., ROBERT Other Clinician: Referring Physician: Treating Physician/Extender:Robson, Pamella Pert., Tacy Learn in Treatment: 0 Education Assessment Education Provided To: Patient Education Topics Provided Nutrition: Handouts: Elevated Blood Sugars: How Do They Affect Wound Healing, Nutrition Methods: Explain/Verbal, Printed Responses: Reinforcements needed Welcome To [facility]: Handouts: Welcome [facility] Methods: Explain/Verbal, Printed Responses: Reinforcements needed Wound/Skin Impairment: Handouts: Caring for Your Ulcer, Skin Care Do's and Dont's Methods: Explain/Verbal, Printed Responses: Reinforcements needed Electronic Signature(s) Signed: 10/11/2019 5:57:11 PM By: Deon Pilling Entered By: Deon Pilling on 10/11/2019 10:07:49 -------------------------------------------------------------------------------- Wound Assessment Details Patient Name: Date of Service: JORMA, TASSINARI 10/11/2019 9:00 AM Medical Record [id number] Patient Account Number: [id number] Date of Birth/Sex: Treating RN: 1940/10/23 (79 y.o. [name] Jacquie Lukes: Glennon Mac., ROBERT Other Clinician: Referring Jurni Cesaro: Treating Rayley Gao/Extender:Robson, Pamella Pert., ROBERT Weeks in Treatment: 0 Wound Status Wound Number: 1 Primary Venous Leg Ulcer Etiology: Wound Location: Left Lower Leg - Lateral Wound Open Wounding Event: Blister Status: Date Acquired: 09/11/2019 Comorbid Anemia, Congestive Heart Failure, Weeks Of Treatment: 0 History: Hypertension, Peripheral Venous Disease, Clustered  Wound: Yes End Stage Renal Disease Photos Wound Measurements Length: (cm) 7.9 % Reduct Width: (cm) 9.5 % Reduct Depth: (cm) 0.1 Epitheli Clustered Quantity: 5 Tunnelin Area: (cm) 58.944 Undermi Volume: (cm) 5.894 Wound Description Full Thickness Without Exposed  Support Foul Odo Classification: Structures Slough/F Wound Distinct, outline attached Margin: Exudate Medium Amount: Exudate Serous Type: Exudate amber Color: Wound Bed Granulation Amount: Small (1-33%) Granulation Quality: Red, Pink Fascia E Necrotic Amount: Large (67-100%) Fat Laye Necrotic Quality: Adherent Slough Tendon E Muscle E Joint Ex Bone Exp r After Cleansing: No ibrino Yes Exposed Structure xposed: No r (Subcutaneous Tissue) Exposed: Yes xposed: No xposed: No posed: No osed: No ion in Area: 0% ion in Volume: 0% alization: Small (1-33%) g: No ning: No Treatment Notes Wound #1 (Left, Lateral Lower Leg) 2. Periwound Care Moisturizing lotion TCA Cream 3. Primary Dressing Applied Other primary dressing (specifiy in notes) 4. Secondary Dressing ABD Pad 6. Support Layer Applied 3 layer compression wrap Notes sorbact swab Electronic Signature(s) Signed: 10/11/2019 4:30:47 PM By: Mikeal Hawthorne EMT/HBOT Signed: 10/11/2019 5:57:11 PM By: Deon Pilling Entered By: Mikeal Hawthorne on 10/11/2019 14:38:13 -------------------------------------------------------------------------------- Wound Assessment Details Patient Name: Date of Service: MOHANNAD, OLIVERO 10/11/2019 9:00 AM Medical Record [id number] Patient Account Number: [id number] Date of Birth/Sex: Treating RN: 1941-04-07 (79 y.o. [name] Micajah Dennin: Glennon Mac., ROBERT Other Clinician: Referring Tearah Saulsbury: Treating Kayleigh Broadwell/Extender:Robson, Pamella Pert., ROBERT Weeks in Treatment: 0 Wound Status Wound Number: 2 Primary Venous Leg Ulcer Etiology: Wound Location: Left Lower Leg - Anterior, Distal Wound Open Wounding Event: Blister Status: Date Acquired: 09/11/2019 Comorbid Anemia, Congestive Heart Failure, Weeks Of Treatment: 0 History: Hypertension, Peripheral Venous Disease, Clustered Wound: No End Stage Renal Disease Photos Wound Measurements Length: (cm) 0.4  % Reduct Width: (cm) 0.4 % Reduct Depth: (cm) 0.1 Epitheli Area: (cm) 0.126 Tunneli Volume: (cm) 0.013 Undermi Wound Description Classification: Full Thickness Without Exposed Support Foul Odo Structures Slough/F Wound Distinct, outline attached Margin: Exudate Small Amount: Exudate Serous Type: Exudate Exudate amber Color: Wound Bed Granulation Amount: Large (67-100%) Granulation Quality: Pink Fascia Necrotic Amount: None Present (0%) Fat Lay Tendon Muscle Joint E Bone Ex r After Cleansing: No ibrino No Exposed Structure Exposed: No er (Subcutaneous Tissue) Exposed: Yes Exposed: No Exposed: No xposed: No posed: No ion in Area: 0% ion in Volume: 0% alization: None ng: No ning: No Treatment Notes Wound #2 (Left, Distal, Anterior Lower Leg) 2. Periwound Care Moisturizing lotion TCA Cream 3. Primary Dressing Applied Other primary dressing (specifiy in notes) 4. Secondary Dressing ABD Pad 6. Support Layer Applied 3 layer compression wrap Notes sorbact swab Electronic Signature(s) Signed: 10/11/2019 4:30:47 PM By: Mikeal Hawthorne EMT/HBOT Signed: 10/11/2019 5:57:11 PM By: Deon Pilling Entered By: Mikeal Hawthorne on 10/11/2019 14:38:33 -------------------------------------------------------------------------------- Wound Assessment Details Patient Name: Date of Service: JADYN, BARGE 10/11/2019 9:00 AM Medical Record [id number] Patient Account Number: [id number] Date of Birth/Sex: Treating RN: 10/05/1940 (79 y.o. [name] Primary Care Hassel Uphoff: Glennon Mac., ROBERT Other Clinician: Referring Rahima Fleishman: Treating Lincoln Kleiner/Extender:Robson, Pamella Pert., ROBERT Weeks in Treatment: 0 Wound Status Wound Number: 3 Primary Venous Leg Ulcer Etiology: Wound Location: Right Lower Leg - Lateral, Proximal Wound Open Wounding Event: Blister Status: Date Acquired: 09/11/2019 Comorbid Anemia, Congestive Heart Failure, Weeks Of Treatment:  0 History: Hypertension, Peripheral Venous Disease, Clustered Wound: No End Stage Renal Disease Photos Wound Measurements Length: (cm) 3 % Reduct Width: (cm) 2.1 % Reduct Depth: (cm) 0.1 Epitheli Area: (cm) 4.948 Tunneli Volume: (cm) 0.495 Undermi Wound  Description Classification: Full Thickness Without Exposed Support Foul Odo Structures Slough/F Wound Distinct, outline attached Margin: Exudate Medium Amount: Exudate Serous Type: Exudate amber Color: Wound Bed Granulation Amount: Medium (34-66%) Granulation Quality: Red, Pink Fascia E Necrotic Amount: Medium (34-66%) Fat Laye Necrotic Quality: Adherent Slough Tendon E Muscle E Joint Ex Bone Exp r After Cleansing: No ibrino Yes Exposed Structure xposed: No r (Subcutaneous Tissue) Exposed: Yes xposed: No xposed: No posed: No osed: No ion in Area: 0% ion in Volume: 0% alization: None ng: No ning: No Treatment Notes Wound #3 (Right, Proximal, Lateral Lower Leg) 2. Periwound Care Moisturizing lotion TCA Cream 3. Primary Dressing Applied Other primary dressing (specifiy in notes) 4. Secondary Dressing ABD Pad 6. Support Layer Applied 3 layer compression wrap Notes sorbact swab Electronic Signature(s) Signed: 10/11/2019 4:30:47 PM By: Mikeal Hawthorne EMT/HBOT Signed: 10/11/2019 5:22:04 PM By: Kela Millin Entered By: Mikeal Hawthorne on 10/11/2019 14:39:17 -------------------------------------------------------------------------------- Wound Assessment Details Patient Name: Date of Service: RANDLE, SHATZER 10/11/2019 9:00 AM Medical Record [id number] Patient Account Number: [id number] Date of Birth/Sex: Treating RN: 1940/11/15 (79 y.o. [name] Primary Care Saveah Bahar: Glennon Mac., ROBERT Other Clinician: Referring Christyl Osentoski: Treating Uldine Fuster/Extender:Robson, Pamella Pert., ROBERT Weeks in Treatment: 0 Wound Status Wound Number: 4 Primary Venous Leg Ulcer Etiology: Wound  Location: Right Lower Leg - Lateral, Distal Wound Open Wounding Event: Blister Status: Date Acquired: 09/11/2019 Comorbid Anemia, Congestive Heart Failure, Weeks Of Treatment: 0 History: Hypertension, Peripheral Venous Disease, Clustered Wound: No End Stage Renal Disease Photos Wound Measurements Length: (cm) 3.2 % Reduct Width: (cm) 3.7 % Reduct Depth: (cm) 0.1 Epitheli Area: (cm) 9.299 Tunneli Volume: (cm) 0.93 Undermi Wound Description Classification: Full Thickness Without Exposed Support Foul Odo Structures Slough/F Wound Distinct, outline attached Margin: Exudate Medium Amount: Exudate Serous Type: Exudate amber Color: Wound Bed Granulation Amount: Medium (34-66%) Granulation Quality: Red, Pink Fascia E Necrotic Amount: Medium (34-66%) Fat Laye Necrotic Quality: Adherent Slough Tendon E Muscle E Joint Exp Bone Expo r After Cleansing: No ibrino Yes Exposed Structure xposed: No r (Subcutaneous Tissue) Exposed: Yes xposed: No xposed: No osed: No sed: No ion in Area: 0% ion in Volume: 0% alization: None ng: No ning: No Treatment Notes Wound #4 (Right, Distal, Lateral Lower Leg) 2. Periwound Care Moisturizing lotion TCA Cream 3. Primary Dressing Applied Other primary dressing (specifiy in notes) 4. Secondary Dressing ABD Pad 6. Support Layer Applied 3 layer compression wrap Notes sorbact swab Electronic Signature(s) Signed: 10/11/2019 4:30:47 PM By: Mikeal Hawthorne EMT/HBOT Signed: 10/11/2019 5:22:04 PM By: Kela Millin Entered By: Mikeal Hawthorne on 10/11/2019 14:38:56 -------------------------------------------------------------------------------- Wound Assessment Details Patient Name: Date of Service: MARISSA, LOWREY 10/11/2019 9:00 AM Medical Record [id number] Patient Account Number: [id number] Date of Birth/Sex: Treating RN: Nov 11, 1940 (79 y.o. [name] Primary Care Siah Kannan: Glennon Mac., ROBERT Other  Clinician: Referring Vikram Tillett: Treating Najwa Spillane/Extender:Robson, Pamella Pert., ROBERT Weeks in Treatment: 0 Wound Status Wound Number: 5 Primary Trauma, Other Etiology: Wound Location: Metatarsal head first - Medial Wound Open Wounding Event: Not Known Status: Date Acquired: 10/11/2019 Comorbid Anemia, Congestive Heart Failure, Weeks Of Treatment: 0 History: Hypertension, Peripheral Venous Disease, Clustered Wound: No End Stage Renal Disease Photos Wound Measurements Length: (cm) 0.8 % Reduct Width: (cm) 0.6 % Reduct Depth: (cm) 0.2 Tunnelin Area: (cm) 0.377 Undermi Volume: (cm) 0.075 Wound Description Full Thickness Without Exposed Support Foul Odo Classification: Structures Slough/F Wound Distinct, outline attached Margin: Exudate Small Amount: Exudate Serous Type: Exudate amber Color: Wound Bed Granulation Amount: Small (1-33%) Granulation  Quality: Pink Fascia E Necrotic Amount: Large (67-100%) Fat Laye Necrotic Quality: Eschar, Adherent Slough Tendon E Muscle E Joint Ex Bone Exp r After Cleansing: No ibrino Yes Exposed Structure xposed: No r (Subcutaneous Tissue) Exposed: No xposed: No xposed: No posed: No osed: No ion in Area: 0% ion in Volume: 0% g: No ning: No Treatment Notes Wound #5 (Medial Metatarsal head first) 3. Primary Dressing Applied Other primary dressing (specifiy in notes) 4. Secondary Dressing Dry Gauze Foam Notes sorbact swab Electronic Signature(s) Signed: 10/11/2019 4:30:47 PM By: Mikeal Hawthorne EMT/HBOT Signed: 10/11/2019 5:22:04 PM By: Kela Millin Entered By: Mikeal Hawthorne on 10/11/2019 14:40:01 -------------------------------------------------------------------------------- Vitals Details Patient Name: Date of Service: MONICA, ZAHLER 10/11/2019 9:00 AM Medical Record [id number] Patient Account Number: [id number] Date of Birth/Sex: Treating RN: 25-Oct-1940 (79 y.o. [name] Primary  Care Nicha Hemann: Glennon Mac., ROBERT Other Clinician: Referring Brynlynn Walko: Treating Peola Joynt/Extender:Robson, Pamella Pert., ROBERT Weeks in Treatment: 0 Vital Signs Time Taken: 09:00 Temperature (F): 97.7 Height (in): 71 Pulse (bpm): 87 Source: Stated Respiratory Rate (breaths/min): 19 Weight (lbs): 198 Blood Pressure (mmHg): 141/80 Source: Stated Reference Range: 80 - 120 mg / dl Body Mass Index (BMI): 27.6 Electronic Signature(s) Signed: 10/11/2019 5:22:04 PM By: Kela Millin Entered By: Kela Millin on 10/11/2019 09:15:11

## 2019-10-12 NOTE — Progress Notes (Signed)
MAUDE, GLOOR ([id number]) Visit Report for 10/11/2019 Chief Complaint Document Details Patient Name: Date of Service: [name], [name] 10/11/2019 9:00 AM Medical Record [id number] Patient Account Number: [id number] Date of Birth/Sex: Treating RN: 07/05/1941 (79 y.o. [name] Provider: Glennon Mac., ROBERT Other Clinician: Referring Provider: Treating Provider/Extender:Embree Brawley, Pamella Pert., ROBERT Weeks in Treatment: 0 Information Obtained from: Patient Chief Complaint 10/11/2019; patient is here for review of wounds on his bilateral lower extremities and left foot Electronic Signature(s) Signed: 10/12/2019 1:06:39 PM By: Linton Ham MD Entered By: Linton Ham on 10/11/2019 10:28:56 -------------------------------------------------------------------------------- Debridement Details Patient Name: Date of Service: [name] 10/11/2019 9:00 AM Medical Record [id number] Patient Account Number: [id number] Date of Birth/Sex: Treating RN: 1941/08/25 (79 y.o. [name] Provider: Glennon Mac., ROBERT Other Clinician: Referring Provider: Treating Provider/Extender:Avanna Sowder, Pamella Pert., ROBERT Weeks in Treatment: 0 Debridement Performed for Wound #1 Left,Lateral Lower Leg Assessment: Performed By: Physician Ricard Dillon., MD Debridement Type: Debridement Severity of Tissue Pre Fat layer exposed Debridement: Level of Consciousness (Pre- Awake and Alert procedure): Pre-procedure Verification/Time Out Taken: Yes - 10:10 Start Time: 10:11 Pain Control: Lidocaine 4% Topical Solution Total Area Debrided (L x W): 7.9 (cm) x 9.5 (cm) = 75.05 (cm) Tissue and other material Viable, Non-Viable, Slough, Subcutaneous, Skin: Dermis , Fibrin/Exudate, Slough debrided: Level: Skin/Subcutaneous Tissue Debridement Description: Excisional Instrument: Curette Bleeding: Minimum Hemostasis Achieved: Pressure End Time:  10:17 Procedural Pain: 0 Post Procedural Pain: 0 Response to Treatment: Procedure was tolerated well Level of Consciousness Awake and Alert (Post-procedure): Post Debridement Measurements of Total Wound Length: (cm) 7.9 Width: (cm) 9.5 Depth: (cm) 0.1 Volume: (cm) 5.894 Character of Wound/Ulcer Post Requires Further Debridement Debridement: Severity of Tissue Post Debridement: Fat layer exposed Post Procedure Diagnosis Same as Pre-procedure Electronic Signature(s) Signed: 10/11/2019 5:57:11 PM By: Deon Pilling Signed: 10/12/2019 1:06:39 PM By: Linton Ham MD Entered By: Linton Ham on 10/11/2019 10:28:11 -------------------------------------------------------------------------------- Debridement Details Patient Name: Date of Service: [name] 10/11/2019 9:00 AM Medical Record [id number] Patient Account Number: [id number] Date of Birth/Sex: Treating RN: 1941/02/28 (79 y.o. [name] Provider: Glennon Mac., ROBERT Other Clinician: Referring Provider: Treating Provider/Extender:Oriana Horiuchi, Pamella Pert., ROBERT Weeks in Treatment: 0 Debridement Performed for Wound #3 Right,Proximal,Lateral Lower Leg Assessment: Performed By: Physician Ricard Dillon., MD Debridement Type: Debridement Severity of Tissue Pre Fat layer exposed Debridement: Level of Consciousness (Pre- Awake and Alert procedure): Pre-procedure Verification/Time Out Taken: Yes - 10:10 Start Time: 10:11 Pain Control: Lidocaine 4% Topical Solution Total Area Debrided (L x W): 3 (cm) x 2.1 (cm) = 6.3 (cm) Tissue and other material Viable, Non-Viable, Slough, Subcutaneous, Skin: Dermis , Fibrin/Exudate, Slough Viable, Non-Viable, Slough, Subcutaneous, Skin: Dermis , Fibrin/Exudate, Slough debrided: Level: Skin/Subcutaneous Tissue Debridement Description: Excisional Instrument: Curette Bleeding: Minimum Hemostasis Achieved: Pressure End Time: 10:17 Procedural  Pain: 0 Post Procedural Pain: 0 Response to Treatment: Procedure was tolerated well Level of Consciousness Awake and Alert (Post-procedure): Post Debridement Measurements of Total Wound Length: (cm) 3 Width: (cm) 2.1 Depth: (cm) 0.1 Volume: (cm) 0.495 Character of Wound/Ulcer Post Requires Further Debridement Debridement: Severity of Tissue Post Debridement: Fat layer exposed Post Procedure Diagnosis Same as Pre-procedure Electronic Signature(s) Signed: 10/11/2019 5:57:11 PM By: Deon Pilling Signed: 10/12/2019 1:06:39 PM By: Linton Ham MD Entered By: Linton Ham on 10/11/2019 10:28:20 -------------------------------------------------------------------------------- Debridement Details Patient Name: Date of Service: [name] 10/11/2019 9:00 AM Medical Record [id number] Patient Account Number: [id number] Date of Birth/Sex: Treating RN: 1940/09/22 (79 y.o. M)  Deon Pilling Primary Care Provider: Glennon Mac., ROBERT Other Clinician: Referring Provider: Treating Provider/Extender:Bernhardt Riemenschneider, Pamella Pert., ROBERT Weeks in Treatment: 0 Debridement Performed for Wound #5 Medial Metatarsal head first Assessment: Performed By: Physician Ricard Dillon., MD Debridement Type: Debridement Level of Consciousness (Pre- Awake and Alert procedure): Pre-procedure Verification/Time Out Taken: Yes - 10:10 Start Time: 10:11 Pain Control: Lidocaine 4% Topical Solution Total Area Debrided (L x W): 1 (cm) x 1 (cm) = 1 (cm) Tissue and other material Viable, Non-Viable, Slough, Subcutaneous, Skin: Dermis , Fibrin/Exudate, Slough debrided: Level: Skin/Subcutaneous Tissue Debridement Description: Excisional Instrument: Curette Bleeding: Minimum Hemostasis Achieved: Pressure End Time: 10:17 Procedural Pain: 0 Post Procedural Pain: 0 Response to Treatment: Procedure was tolerated well Level of Consciousness Awake and Alert (Post-procedure): Post Debridement  Measurements of Total Wound Length: (cm) 0.8 Width: (cm) 0.6 Depth: (cm) 0.2 Volume: (cm) 0.075 Character of Wound/Ulcer Post Requires Further Debridement Debridement: Post Procedure Diagnosis Same as Pre-procedure Electronic Signature(s) Signed: 10/11/2019 5:57:11 PM By: Deon Pilling Signed: 10/12/2019 1:06:39 PM By: Linton Ham MD Entered By: Linton Ham on 10/11/2019 10:28:29 -------------------------------------------------------------------------------- HPI Details Patient Name: Date of Service: PERKINS, MOLINA 10/11/2019 9:00 AM Medical Record [id number] Patient Account Number: [id number] Date of Birth/Sex: Treating RN: [birth year] (79 y.o. [name] Provider: Glennon Mac., ROBERT Other Clinician: Referring Provider: Treating Provider/Extender:Juanelle Trueheart, Pamella Pert., ROBERT Weeks in Treatment: 0 History of Present Illness HPI Description: ADMISSION 10/11/2019 This is a 78 year old man with a history of a severe cardiomyopathy with an ejection fraction of about 20%, chronic renal failure stage III. He is listed as a type II diabetic in epic although the patient denies this. He also has a history of PVD. He states for the last month he has had wounds on his bilateral lower extremities that started off as blisters which denuded. He has areas on the left lateral calf and 2 on the right lateral. He has an area on the left first met head which he did not know was there he we identified this on intake. He has been using Silvadene cream provided by his primary care physician but he is complaining that this burns. Past medical history; acute on chronic congestive heart failure with a severe cardiomyopathy, history of hypoalbuminemia with an albumin of 1.9 in November, on chronic Coumadin at this point for reasons that are not totally clear, listed as a type II diabetic although the patient denies this, chronic kidney disease stage III,  cholangitis with an acute hospital admission from 10/19 through 07/08/2019. He was acutely ill at that time complicating GI bleed. ABIs in our clinic were 1.16 on the right and 1.13 on the left Electronic Signature(s) Signed: 10/12/2019 1:06:39 PM By: Linton Ham MD Entered By: Linton Ham on 10/11/2019 10:31:35 -------------------------------------------------------------------------------- Physical Exam Details Patient Name: Date of Service: MILEN, LENGACHER 10/11/2019 9:00 AM Medical Record [id number] Patient Account Number: [id number] Date of Birth/Sex: Treating RN: Mar 27, 1941 (79 y.o. [name] Provider: Glennon Mac., ROBERT Other Clinician: Referring Provider: Treating Provider/Extender:Dewanna Hurston, Pamella Pert., ROBERT Weeks in Treatment: 0 Constitutional Patient is hypertensive.. Pulse regular and within target range for patient.Marland Kitchen Respirations regular, non-labored and within target range.. Temperature is normal and within the target range for the patient.Marland Kitchen Appears in no distress. Respiratory work of breathing is normal. Bilateral breath sounds are clear and equal in all lobes with no wheezes, rales or rhonchi.. Cardiovascular Midsystolic click no S3 JVP is not elevated. Femoral and popliteal pulses are palpable. Pedal pulses  are palpable but reduced at both the dorsalis pedis and posterior tibial bilaterally. He has skin changes in the lower extremity with probable hemosiderin, tightly fibrotic adherent skin. Nonpitting edema which extends up into his thighs posteriorly. Gastrointestinal (GI) Abdomen is soft and non-distended without masses or tenderness. Slightly distended. Lymphatic None palpable in the popliteal or inguinal area. Psychiatric appears at normal baseline. Notes Wound exam; the area in question is really mostly on the right lateral and left lateral calf. On the left lateral he has a constellation of open areas tightly  adherent fibrinous debris. Similar changes on the right with 2 open wounds the superior wound they require debridement the inferior 1 did not. He also had an area over his bunion on the medial first MTP. He was not aware he had this wound. This also required debridement. Large areas of the wounds on the lateral calves had tightly adherent fibrinous debris I attempted to get as much of this off as we could with the patient tolerating it. Electronic Signature(s) Signed: 10/12/2019 1:06:39 PM By: Linton Ham MD Entered By: Linton Ham on 10/11/2019 10:34:44 -------------------------------------------------------------------------------- Physician Orders Details Patient Name: Date of Service: ZAXTON, ANGERER 10/11/2019 9:00 AM Medical Record [id number] Patient Account Number: [id number] Date of Birth/Sex: Treating RN: [birth year] (79 y.o. [name] Provider: Glennon Mac., ROBERT Other Clinician: Referring Provider: Treating Provider/Extender:Mckynna Vanloan, Pamella Pert., ROBERT Weeks in Treatment: 0 Verbal / Phone Orders: No Diagnosis Coding Follow-up Appointments Return Appointment in 1 week. - Thursday Dressing Change Frequency Other: - change twice a week by home health. wound Center to change weekly. Skin Barriers/Peri-Wound Care Moisturizing lotion TCA Cream or Ointment - liberally in clinic today mixed with lotion. Wound Cleansing May shower with protection. - use cast protectors on the days dressings are not changed. May shower and wash wound with soap and water. - with dressing changes only. Primary Wound Dressing Wound #1 Left,Lateral Lower Leg Cutimed Sorbact - apply hydrogel over sorbact please. Wound #2 Left,Distal,Anterior Lower Leg Cutimed Sorbact - apply hydrogel over sorbact please. Wound #3 Right,Proximal,Lateral Lower Leg Cutimed Sorbact - apply hydrogel over sorbact please. Wound #4 Right,Distal,Lateral Lower Leg Cutimed Sorbact -  apply hydrogel over sorbact please. Wound #5 Medial Metatarsal head first Cutimed Sorbact - apply hydrogel over sorbact please. Secondary Dressing Wound #1 Left,Lateral Lower Leg ABD pad Wound #2 Left,Distal,Anterior Lower Leg ABD pad Wound #3 Right,Proximal,Lateral Lower Leg ABD pad Wound #4 Right,Distal,Lateral Lower Leg ABD pad Wound #5 Medial Metatarsal head first Foam - foam donut Dry Gauze Edema Control 3 Layer Compression System - Bilateral Avoid standing for long periods of time Elevate legs to the level of the heart or above for 30 minutes daily and/or when sitting, a frequency of: - throughout the day. Off-Loading Wound #5 Medial Metatarsal head first Open toe surgical shoe to: - felt the shoe for left foot. [facility] for wound care. [facility] home [facility] Patient Medications Allergies: No Known Allergies Notifications Medication Indication Start End lidocaine DOSE topical 4 % gel - gel topical applied prior to debridement by MD. Electronic Signature(s) Signed: 10/11/2019 5:57:11 PM By: Deon Pilling Signed: 10/12/2019 1:06:39 PM By: Linton Ham MD Entered By: Deon Pilling on 10/11/2019 10:25:03 -------------------------------------------------------------------------------- Prescription 10/11/2019 Patient Name: [name] Provider: Linton Ham MD Date of Birth: [birth year] NPI#: [id number] Sex: M DEA#: [id number] Phone #: [phone] License #: [id number] Patient Address: [place] [address]  [place], [place] [address] [place], [place] [address] [id number] Allergies No Known Allergies Medication Medication: Route: Strength: Form: lidocaine topical 4% gel Class: TOPICAL LOCAL ANESTHETICS Dose: Frequency / Time: Indication: gel topical applied prior to debridement by MD. Number of Refills: Number of Units: 0 Generic  Substitution: Start Date: End Date: Administered at [facility]: Yes Time Administered: Time Discontinued: Note to Pharmacy: Signature(s): Date(s): Electronic Signature(s) Signed: 10/11/2019 5:57:11 PM By: Deon Pilling Signed: 10/12/2019 1:06:39 PM By: Linton Ham MD Entered By: Deon Pilling on 10/11/2019 10:25:04 --------------------------------------------------------------------------------  Problem List Details Patient Name: Date of Service: JUDY, GOODENOW 10/11/2019 9:00 AM Medical Record [id number] Patient Account Number: [id number] Date of Birth/Sex: Treating RN: 1940-08-31 (79 y.o. 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 [name] Provider: Glennon Mac., ROBERT Other  Clinician: Referring Provider: Treating Provider/Extender:Kahmari Herard, Pamella Pert., ROBERT Weeks in Treatment: 0 Subjective Chief Complaint Information obtained from Patient 10/11/2019; patient is here for review of wounds on his bilateral lower extremities and left foot History of Present Illness (HPI) ADMISSION 10/11/2019 This is a 79 year old man with a history of a severe cardiomyopathy with an ejection fraction of about 20%, chronic renal failure stage III. He is listed as a type II diabetic in epic although the patient denies this. He also has a history of PVD. He states for the last month he has had wounds on his bilateral lower extremities that started off as blisters which denuded. He has areas on the left lateral calf and 2 on the right lateral. He has an area on the left first met head which he did not know was there he we identified this on intake. He has been using Silvadene cream provided by his primary care physician but he is complaining that this burns. 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 He was acutely ill at that time complicating GI bleed. ABIs in our clinic were 1.16 on the right and 1.13 on the left Patient History Information obtained from Patient. Allergies No Known Allergies Family History Heart Disease - Father,Siblings, Hypertension - Father,Siblings, Stroke - Siblings, No family history of Cancer, Diabetes, Hereditary Spherocytosis, Kidney Disease, Lung Disease, Seizures, Thyroid Problems, Tuberculosis. Social History Former smoker - quit 11 years ago, Alcohol Use - Rarely, Drug Use -  No History,  Caffeine Use - Never. Medical History Hematologic/Lymphatic Patient has history of Anemia Respiratory Denies history of Aspiration, Asthma, Chronic Obstructive Pulmonary Disease (COPD), Pneumothorax, Sleep Apnea, Tuberculosis Cardiovascular Patient has history of Congestive Heart Failure, Hypertension, Peripheral Venous Disease Genitourinary Patient has history of End Stage Renal Disease Integumentary (Skin) Denies history of History of Burn Hospitalization/Surgery History - sphincterotomy. - cholecystostomy. Medical And Surgical History Notes Respiratory history of bronchitis acute respiratory failure Cardiovascular cardiomegaly Gastrointestinal ulcers Genitourinary says he is having prostate issues Review of Systems (ROS) Constitutional Symptoms (General Health) Denies complaints or symptoms of Fatigue, Fever, Chills, Marked Weight Change. 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 Ear/Nose/Mouth/Throat Denies complaints or symptoms of Chronic sinus problems or rhinitis. Respiratory Denies complaints or symptoms of Chronic or frequent coughs, Shortness of Breath. Cardiovascular Denies complaints or symptoms of Chest pain. Gastrointestinal Denies complaints or symptoms of Frequent diarrhea, Nausea, Vomiting. Endocrine Denies complaints or symptoms of Heat/cold intolerance. Genitourinary Denies complaints or symptoms of Frequent urination. Integumentary (Skin) Complains or has symptoms of Wounds, lower legs Musculoskeletal Denies complaints or symptoms of Muscle Pain, Muscle Weakness. Neurologic Denies complaints or symptoms of Numbness/parasthesias. Psychiatric Denies complaints or symptoms of Claustrophobia, Suicidal. Objective Constitutional Patient is hypertensive.. Pulse regular and within target range for patient.Marland Kitchen Respirations regular, non-labored and within target range.. Temperature is normal and within the target range for the  patient.Marland Kitchen Appears in no distress. Vitals Time Taken: 9:00 AM, Height: 71 in, Source: Stated, Weight: 198 lbs, Source: Stated, BMI: 27.6, Temperature: 97.7 F, Pulse: 87 bpm, Respiratory Rate: 19 breaths/min, Blood Pressure: 141/80 mmHg. Respiratory work of breathing is normal. Bilateral breath sounds are clear and equal in all lobes with no wheezes, rales or rhonchi.. Cardiovascular Midsystolic click no S3 JVP is not elevated. Femoral and popliteal pulses are palpable. Pedal pulses are palpable but reduced at both the dorsalis pedis and posterior tibial bilaterally. He has skin changes in the lower extremity with probable hemosiderin, tightly fibrotic adherent skin. Nonpitting edema which extends up into his thighs posteriorly. Gastrointestinal (GI) Abdomen is soft and non-distended without masses or tenderness. Slightly distended. Lymphatic None palpable in the popliteal or inguinal area. Psychiatric appears at normal baseline. General Notes: Wound exam; the area in question is really mostly on the right lateral and left lateral calf. On the left lateral he has a constellation of open areas tightly adherent fibrinous debris. Similar changes on the right with 2 open wounds the superior wound they require debridement the inferior 1 did not. ooHe also had an area over his bunion on the medial first MTP. He was not aware he had this wound. This also required debridement. ooLarge areas of the wounds on the lateral calves had tightly adherent fibrinous debris I attempted to get as much of this off as we could with the patient tolerating it. Integumentary (Hair, Skin) Wound #1 status is Open. Original cause of wound was Blister. The wound is located on the Left,Lateral Lower Leg. The wound measures 7.9cm length x 9.5cm width x 0.1cm depth; 58.944cm^2 area and 5.894cm^3 volume. There is Fat Layer (Subcutaneous Tissue) Exposed exposed. There is no tunneling or undermining noted. There is  a medium amount of serous drainage noted. The wound margin is distinct with the outline attached to the wound base. There is small (1-33%) red, pink granulation within the wound bed. There is a large (67-100%) amount of necrotic tissue within the wound bed including Adherent Slough. Wound #2 status  is Open. Original cause of wound was Blister. The wound is located on the [facility] Lower Leg. The wound measures 0.4cm length x 0.4cm width x 0.1cm depth; 0.126cm^2 area and 0.013cm^3 volume. There is Fat Layer (Subcutaneous Tissue) Exposed exposed. There is no tunneling or undermining noted. There is a small amount of serous drainage noted. The wound margin is distinct with the outline attached to the wound base. There is large (67-100%) pink granulation within the wound bed. There is no necrotic tissue within the wound bed. Wound #3 status is Open. Original cause of wound was Blister. The wound is located on the Right,Proximal,Lateral Lower Leg. The wound measures 3cm length x 2.1cm width x 0.1cm depth; 4.948cm^2 area and 0.495cm^3 volume. There is Fat Layer (Subcutaneous Tissue) Exposed exposed. There is no tunneling or undermining noted. There is a medium amount of serous drainage noted. The wound margin is distinct with the outline attached to the wound base. There is medium (34-66%) red, pink granulation within the wound bed. There is a medium (34-66%) amount of necrotic tissue within the wound bed including Adherent Slough. Wound #4 status is Open. Original cause of wound was Blister. The wound is located on the Right,Distal,Lateral Lower Leg. The wound measures 3.2cm length x 3.7cm width x 0.1cm depth; 9.299cm^2 area and 0.93cm^3 volume. There is Fat Layer (Subcutaneous Tissue) Exposed exposed. There is no tunneling or undermining noted. There is a medium amount of serous drainage noted. The wound margin is distinct with the outline attached to the wound base. There is medium  (34-66%) red, pink granulation within the wound bed. There is a medium (34-66%) amount of necrotic tissue within the wound bed including Adherent Slough. Wound #5 status is Open. Original cause of wound was Not Known. The wound is located on the Medial Metatarsal head first. The wound measures 0.8cm length x 0.6cm width x 0.2cm depth; 0.377cm^2 area and 0.075cm^3 volume. There is no tunneling or undermining noted. There is a small amount of serous drainage noted. The wound margin is distinct with the outline attached to the wound base. There is small (1-33%) pink granulation within the wound bed. There is a large (67-100%) amount of necrotic tissue within the wound bed including Eschar and Adherent Slough. 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 There was a Excisional Skin/Subcutaneous Tissue Debridement with a total area of 75.05 sq cm performed by Ricard Dillon., MD. With the following instrument(s): Curette to remove Viable and Non-Viable tissue/material. Material removed includes Subcutaneous Tissue, Slough, Skin: Dermis, and Fibrin/Exudate after achieving pain control using Lidocaine 4% Topical Solution. A time out was conducted at 10:10, prior to the start of the procedure. A Minimum amount of bleeding was controlled with Pressure. The procedure was tolerated well with a pain level of 0 throughout and a pain level of 0 following the procedure. Post Debridement  Measurements: 7.9cm length x 9.5cm width x 0.1cm depth; 5.894cm^3 volume. Character of Wound/Ulcer Post Debridement requires further debridement. Severity of Tissue Post Debridement is: Fat layer exposed. Post procedure Diagnosis Wound #1:  Same as Pre-Procedure Wound #3 Pre-procedure diagnosis of Wound #3 is a Venous Leg Ulcer located on the Right,Proximal,Lateral Lower Leg .Severity of Tissue Pre Debridement is: Fat layer exposed. There was a Excisional Skin/Subcutaneous Tissue Debridement with a total area of 6.3 sq cm performed by Ricard Dillon., MD. With the following instrument(s): Curette to remove Viable and Non-Viable tissue/material. Material removed includes Subcutaneous Tissue, Slough, Skin: Dermis, and Fibrin/Exudate after achieving pain control using Lidocaine 4% Topical Solution. A time out was conducted at 10:10, prior to the start of the procedure. A Minimum amount of bleeding was controlled with Pressure. The procedure was tolerated well with a pain level of 0 throughout and a pain level of 0 following the procedure. Post Debridement Measurements: 3cm length x 2.1cm width x 0.1cm depth; 0.495cm^3 volume. Character of Wound/Ulcer Post Debridement requires further debridement. Severity of Tissue Post Debridement is: Fat layer exposed. Post procedure Diagnosis Wound #3: Same as Pre-Procedure Wound #5 Pre-procedure diagnosis of Wound #5 is a Trauma, Other located on the Medial Metatarsal head first . There was a Excisional Skin/Subcutaneous Tissue Debridement with a total area of 1 sq cm performed by Ricard Dillon., MD. With the following instrument(s): Curette to remove Viable and Non-Viable tissue/material. Material removed includes Subcutaneous Tissue, Slough, Skin: Dermis, and Fibrin/Exudate after achieving pain control using Lidocaine 4% Topical Solution. A time out was conducted at 10:10, prior to the start of the procedure. A Minimum amount of bleeding was controlled  with Pressure. The procedure was tolerated well with a pain level of 0 throughout and a pain level of 0 following the procedure. Post Debridement Measurements: 0.8cm length x 0.6cm width x 0.2cm depth; 0.075cm^3 volume. Character of Wound/Ulcer Post Debridement requires further debridement. Post procedure Diagnosis Wound #5: Same as Pre-Procedure Plan Follow-up Appointments: Return Appointment in 1 week. - Thursday Dressing Change Frequency: Other: - change twice a week by home health. wound Center to change weekly. Skin Barriers/Peri-Wound Care: Moisturizing lotion TCA Cream or Ointment - liberally in clinic today mixed with lotion. Wound Cleansing: May shower with protection. - use cast protectors on the days dressings are not changed. May shower and wash wound with soap and water. - with dressing changes only. Primary Wound Dressing: Wound #1 Left,Lateral Lower Leg: Cutimed Sorbact - apply hydrogel over sorbact please. Wound #2 Left,Distal,Anterior Lower Leg: Cutimed Sorbact - apply hydrogel over sorbact please. Wound #3 Right,Proximal,Lateral Lower Leg: Cutimed Sorbact - apply hydrogel over sorbact please. Wound #4 Right,Distal,Lateral Lower Leg: Cutimed Sorbact - apply hydrogel over sorbact please. Wound #5 Medial Metatarsal head first: Cutimed Sorbact - apply hydrogel over sorbact please. Secondary Dressing: Wound #1 Left,Lateral Lower Leg: ABD pad Wound #2 Left,Distal,Anterior Lower Leg: ABD pad Wound #3 Right,Proximal,Lateral Lower Leg: ABD pad Wound #4 Right,Distal,Lateral Lower Leg: ABD pad Wound #5 Medial Metatarsal head first: Foam - foam donut Dry Gauze Edema Control: 3 Layer Compression System - Bilateral Avoid standing for long periods of time Elevate legs to the level of the heart or above for 30 minutes daily and/or when sitting, a frequency of: - throughout the day. Off-Loading: Wound #5 Medial Metatarsal head first: Open toe surgical shoe to: - felt  the shoe for left foot. Home Health: [facility] for wound care. - [facility] The following medication(s) was prescribed: lidocaine topical 4 % gel gel topical applied prior to debridement by MD. was prescribed at facility 1. Tightly adherent debris over most of these wound areas bilaterally. We  used Sorbact hydrogel to see if we can get some debridement done. I do not know that he would tolerate Iodoflex but that remains an option. He is going to need compression which home health will change we elected to use 3 layer 2. He is listed as having a history of PVD. I did not see any arterial studies in epic. In our clinic his ABIs were normal his peripheral pulses were not robust but palpable I felt 3 where it layer compression was safe 3. The patient has several systemic reasons to have edema widespread including an cardiomyopathy and stage III chronic kidney disease but I could see no evidence of heart failure at the bedside. 4. 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 He will definitely need stockings going forward. The skin in his lower extremities will not tolerate any degree of edema and I think this is the primary reason for these wounds I spent 35 minutes in the review of epic, face-to-face evaluation of this patient and preparation of this note Electronic Signature(s) Signed: 10/12/2019 1:06:39 PM By: Linton Ham MD Entered By: Linton Ham on 10/11/2019 10:37:05 -------------------------------------------------------------------------------- HxROS Details Patient Name: Date of Service: SELAH, ZELMAN 10/11/2019 9:00 AM Medical Record [id number] Patient Account Number: [id number] Date of Birth/Sex: Treating RN: February 26, 1941 (79 y.o. 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 Ear/Nose/Mouth/Throat Complaints and Symptoms: Negative for: Chronic sinus problems or rhinitis Respiratory Complaints and Symptoms: Negative for: Chronic or frequent coughs; Shortness of Breath Medical History: Negative for: Aspiration; Asthma; Chronic Obstructive Pulmonary Disease (COPD); Pneumothorax; Sleep Apnea; Tuberculosis Past Medical History Notes: history of bronchitis acute respiratory failure Cardiovascular Complaints and Symptoms: Negative for: Chest pain Medical History: Positive for: Congestive Heart Failure; Hypertension; Peripheral Venous Disease Past Medical History Notes: cardiomegaly Gastrointestinal Complaints and Symptoms: Negative for: Frequent diarrhea; Nausea; Vomiting Medical History: Past Medical History Notes: ulcers Endocrine Complaints and Symptoms: Negative for: Heat/cold intolerance Genitourinary Complaints and Symptoms: Negative for: Frequent urination Medical History: Positive for: End Stage Renal Disease Past Medical History Notes: says he is having prostate issues Integumentary (Skin) Complaints and Symptoms: Positive for: Wounds Review of System Notes: lower legs Medical History: Negative for: History of Burn Musculoskeletal Complaints and Symptoms: Negative for: Muscle Pain; Muscle Weakness Neurologic Complaints and Symptoms: Negative for: Numbness/parasthesias Psychiatric Complaints and Symptoms: Negative for: Claustrophobia; Suicidal Hematologic/Lymphatic Medical History: Positive for: Anemia Immunological Oncologic Immunizations Pneumococcal  Vaccine: Received Pneumococcal Vaccination: No Implantable Devices No devices added Hospitalization / Surgery History Type of Hospitalization/Surgery sphincterotomy cholecystostomy Family and Social History Cancer: No; Diabetes: No; Heart Disease: Yes - Father,Siblings; Hereditary Spherocytosis: No; Hypertension: Yes - Father,Siblings; Kidney Disease: No; Lung Disease: No; Seizures: No; Stroke: Yes - Siblings; Thyroid Problems: No; Tuberculosis: No; Former smoker - quit 11 years ago; Alcohol Use: Rarely; Drug Use: No History; Caffeine Use: Never; Financial Concerns: No; Food, Clothing or Shelter Needs: No; Support System Lacking: No; Transportation Concerns: Yes - uses [employer]) Signed: 10/11/2019 5:22:04 PM By: Kela Millin Signed: 10/12/2019 1:06:39 PM By: Linton Ham MD Entered By: Kela Millin on 10/11/2019 09:23:13 -------------------------------------------------------------------------------- SuperBill Details Patient Name: Date  of Service: TEO, MOEDE 10/11/2019 Medical Record [id number] Patient Account Number: [id number] Date of Birth/Sex: Treating RN: 12-Aug-1941 (79 y.o. 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 Facility Procedures The patient participates with Medicare or their insurance follows the Medicare Facility Guidelines: CPT4 Code Description Modifier  Quantity [id number] [phone] - WOUND CARE VISIT-LEV 4 EST PT 1 The patient participates with Medicare or their insurance follows the Medicare Facility Guidelines: [id number] 11042 - DEB SUBQ TISSUE 20 SQ CM/< 1 ICD-10 Diagnosis Description L97.822 Non-pressure chronic ulcer of other part of left lower leg with fat  layer exposed 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 The patient participates with Medicare or their insurance follows the Medicare Facility Guidelines: [id number] 11045 - DEB SUBQ TISS EA ADDL 20CM 4 ICD-10 Diagnosis Description L97.521 Non-pressure chronic ulcer of other part of left foot limited to  breakdown of skin L97.822 Non-pressure chronic ulcer of other part of left lower leg with fat layer exposed Physician Procedures CPT4 Code Description: [id number] WC PHYS LEVEL 3 NEW PT ICD-10 Diagnosis Description I87.323 Chronic venous hypertension (idiopathic) with inflammation o extremity I89.0 Lymphedema, not elsewhere classified L97.822 Non-pressure chronic ulcer of other part  of left lower leg w L97.112 Non-pressure chronic ulcer of right thigh with fat layer exp Modifier: 25 f bilateral lower ith fat layer expo osed Quantity: 1 sed CPT4 Code Description: [id number] 47092 - WC PHYS SUBQ TISS 20 SQ CM ICD-10 Diagnosis Description L97.822 Non-pressure chronic ulcer of other part of left lower leg w L97.112 Non-pressure chronic ulcer of right thigh with fat layer exp Modifier: ith fat layer expo osed Quantity: 1 sed CPT4 Code Description: [id number] 11045 - WC PHYS SUBQ TISS EA ADDL 20 CM ICD-10 Diagnosis Description L97.521 Non-pressure chronic ulcer of other part of left foot limite L97.822 Non-pressure chronic ulcer of other part of left lower leg w Modifier: d to breakdown of ith fat layer expo Quantity: 4 skin sed Electronic Signature(s) Signed: 10/12/2019 1:06:39 PM By: Linton Ham MD Entered By: Linton Ham on 10/11/2019 10:37:48

## 2019-10-18 ENCOUNTER — Encounter (HOSPITAL_BASED_OUTPATIENT_CLINIC_OR_DEPARTMENT_OTHER): Payer: Medicare Other | Admitting: Internal Medicine

## 2019-10-25 ENCOUNTER — Encounter (HOSPITAL_BASED_OUTPATIENT_CLINIC_OR_DEPARTMENT_OTHER): Payer: Medicare Other | Admitting: Internal Medicine

## 2019-10-25 ENCOUNTER — Other Ambulatory Visit: Payer: Self-pay

## 2019-10-25 DIAGNOSIS — E11621 Type 2 diabetes mellitus with foot ulcer: Secondary | ICD-10-CM | POA: Diagnosis not present

## 2019-10-25 NOTE — Progress Notes (Signed)
[name], [name] ([id number]) Visit Report for 10/25/2019 Debridement Details Patient Name: Date of Service: [name], [name] 10/25/2019 8:30 AM Medical Record [id number] Patient Account Number: [id number] Date of Birth/Sex: Treating RN: 1940-10-02 (79 y.o. [name] Provider: Glennon Mac., ROBERT Other Clinician: Referring Provider: Treating Provider/Extender:Robson, Pamella Pert., ROBERT Weeks in Treatment: 2 Debridement Performed for Wound #1 Left,Lateral Lower Leg Assessment: Performed By: Physician Ricard Dillon., MD Debridement Type: Debridement Severity of Tissue Pre Fat layer exposed Debridement: Level of Consciousness (Pre- Awake and Alert procedure): Pre-procedure Verification/Time Out Taken: Yes - 09:28 Start Time: 09:29 Total Area Debrided (L x W): 4 (cm) x 4 (cm) = 16 (cm) Tissue and other material Viable, Non-Viable, Slough, Subcutaneous, Skin: Dermis , Fibrin/Exudate, Slough debrided: Level: Skin/Subcutaneous Tissue Debridement Description: Excisional Instrument: Curette Bleeding: Minimum Hemostasis Achieved: Pressure End Time: 09:32 Procedural Pain: 0 Post Procedural Pain: 0 Response to Treatment: Procedure was tolerated well Level of Consciousness Awake and Alert (Post-procedure): Post Debridement Measurements of Total Wound Length: (cm) 7.2 Width: (cm) 6.3 Depth: (cm) 0.1 Volume: (cm) 3.563 Character of Wound/Ulcer Post Requires Further Debridement Debridement: Severity of Tissue Post Debridement: Fat layer exposed Post Procedure Diagnosis Same as Pre-procedure Electronic Signature(s) Signed: 10/25/2019 5:41:09 PM By: Linton Ham MD Signed: 10/25/2019 6:07:18 PM By: Deon Pilling Entered By: Linton Ham on 10/25/2019 10:10:05 -------------------------------------------------------------------------------- Debridement Details Patient Name: Date of Service: [name], [name] 10/25/2019 8:30 AM Medical  Record [id number] Patient Account Number: [id number] Date of Birth/Sex: Treating RN: December 25, 1940 (79 y.o. [name] Provider: Glennon Mac., ROBERT Other Clinician: Referring Provider: Treating Provider/Extender:Robson, Pamella Pert., ROBERT Weeks in Treatment: 2 Debridement Performed for Wound #3 Right,Proximal,Lateral Lower Leg Assessment: Performed By: Physician Ricard Dillon., MD Debridement Type: Debridement Severity of Tissue Pre Fat layer exposed Debridement: Level of Consciousness (Pre- Awake and Alert procedure): Pre-procedure Yes - 09:28 Verification/Time Out Taken: Start Time: 09:29 Total Area Debrided (L x W): 2.9 (cm) x 2.2 (cm) = 6.38 (cm) Tissue and other material Viable, Non-Viable, Slough, Subcutaneous, Skin: Dermis , Fibrin/Exudate, Slough debrided: Level: Skin/Subcutaneous Tissue Debridement Description: Excisional Instrument: Curette Bleeding: Minimum Hemostasis Achieved: Pressure End Time: 09:32 Procedural Pain: 0 Post Procedural Pain: 0 Response to Treatment: Procedure was tolerated well Level of Consciousness Awake and Alert (Post-procedure): Post Debridement Measurements of Total Wound Length: (cm) 2.9 Width: (cm) 2.2 Depth: (cm) 0.1 Volume: (cm) 0.501 Character of Wound/Ulcer Post Requires Further Debridement Debridement: Severity of Tissue Post Debridement: Fat layer exposed Post Procedure Diagnosis Same as Pre-procedure Electronic Signature(s) Signed: 10/25/2019 5:41:09 PM By: Linton Ham MD Signed: 10/25/2019 6:07:18 PM By: Deon Pilling Entered By: Linton Ham on 10/25/2019 10:10:12 -------------------------------------------------------------------------------- Debridement Details Patient Name: Date of Service: [name], [name] 10/25/2019 8:30 AM Medical Record [id number] Patient Account Number: [id number] Date of Birth/Sex: Treating RN: [birth year] (79 y.o. [name]  Provider: Glennon Mac., ROBERT Other Clinician: Referring Provider: Treating Provider/Extender:Robson, Pamella Pert., ROBERT Weeks in Treatment: 2 Debridement Performed for Wound #4 Right,Distal,Lateral Lower Leg Assessment: Performed By: Physician Ricard Dillon., MD Debridement Type: Debridement Severity of Tissue Pre Fat layer exposed Debridement: Level of Consciousness (Pre- Awake and Alert procedure): Pre-procedure Yes - 09:28 Verification/Time Out Taken: Start Time: 09:29 Total Area Debrided (L x W): 2.4 (cm) x 3.3 (cm) = 7.92 (cm) Tissue and other material Viable, Non-Viable, Slough, Subcutaneous, Skin: Dermis , Fibrin/Exudate, Slough debrided: Level: Skin/Subcutaneous Tissue Debridement Description: Excisional Instrument: Curette Bleeding: Minimum Hemostasis Achieved: Pressure End Time: 09:32 Procedural Pain:  0 Post Procedural Pain: 0 Response to Treatment: Procedure was tolerated well Level of Consciousness Awake and Alert (Post-procedure): Post Debridement Measurements of Total Wound Length: (cm) 2.4 Width: (cm) 3.3 Depth: (cm) 0.1 Volume: (cm) 0.622 Character of Wound/Ulcer Post Requires Further Debridement Debridement: Severity of Tissue Post Debridement: Fat layer exposed Post Procedure Diagnosis Same as Pre-procedure Electronic Signature(s) Signed: 10/25/2019 5:41:09 PM By: Linton Ham MD Signed: 10/25/2019 6:07:18 PM By: Deon Pilling Entered By: Linton Ham on 10/25/2019 10:10:20 -------------------------------------------------------------------------------- Debridement Details Patient Name: Date of Service: [name], [name] 10/25/2019 8:30 AM Medical Record [id number] Patient Account Number: [id number] Date of Birth/Sex: Treating RN: 01/12/1941 (79 y.o. [name] Provider: Glennon Mac., ROBERT Other Clinician: Referring Provider: Treating Provider/Extender:Robson, Pamella Pert., ROBERT Weeks in  Treatment: 2 Debridement Performed for Wound #5 Left,Medial Metatarsal head first Assessment: Performed By: Physician Ricard Dillon., MD Debridement Type: Debridement Level of Consciousness (Pre- Awake and Alert procedure): Pre-procedure Yes - 09:28 Verification/Time Out Taken: Start Time: 09:29 Pain Control: Lidocaine 4% Topical Solution Total Area Debrided (L x W): 1 (cm) x 0.7 (cm) = 0.7 (cm) Tissue and other material Viable, Non-Viable, Slough, Subcutaneous, Skin: Dermis , Fibrin/Exudate, Slough debrided: Level: Skin/Subcutaneous Tissue Debridement Description: Excisional Instrument: Curette Bleeding: Minimum Hemostasis Achieved: Pressure End Time: 09:32 Procedural Pain: 0 Post Procedural Pain: 0 Response to Treatment: Procedure was tolerated well Level of Consciousness Awake and Alert (Post-procedure): Post Debridement Measurements of Total Wound Length: (cm) 1 Width: (cm) 0.7 Depth: (cm) 0.1 Volume: (cm) 0.055 Character of Wound/Ulcer Post Requires Further Debridement Debridement: Post Procedure Diagnosis Same as Pre-procedure Electronic Signature(s) Signed: 10/25/2019 5:41:09 PM By: Linton Ham MD Signed: 10/25/2019 6:07:18 PM By: Deon Pilling Entered By: Linton Ham on 10/25/2019 10:10:27 -------------------------------------------------------------------------------- HPI Details Patient Name: Date of Service: [name], [name] 10/25/2019 8:30 AM Medical Record [id number] Patient Account Number: [id number] Date of Birth/Sex: Treating RN: 02-28-1941 (79 y.o. [name] Provider: Glennon Mac., ROBERT Other Clinician: Referring Provider: Treating Provider/Extender:Robson, Pamella Pert., ROBERT Weeks in Treatment: 2 History of Present Illness HPI Description: ADMISSION 10/11/2019 This is a 79 year old man with a history of a severe cardiomyopathy with an ejection fraction of about 20%, chronic renal failure stage III.  He is listed as a type II diabetic in epic although the patient denies this. He also has a history of PVD. He states for the last month he has had wounds on his bilateral lower extremities that started off as blisters which denuded. He has areas on the left lateral calf and 2 on the right lateral. He has an area on the left first met head which he did not know was there he we identified this on intake. He has been using Silvadene cream provided by his primary care physician but he is complaining that this burns. 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 He was acutely ill at that time complicating GI bleed. ABIs in our clinic were 1.16 on the right and 1.13 on the left 2/25; the patient comes in with his areas on the left lateral and right lateral calf. There is also an area over the left first MTP bunion deformity. We have  been using Sorbact. 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 [name] Provider: Glennon Mac., ROBERT Other Clinician: Referring Provider: Treating Provider/Extender:Robson, Pamella Pert., ROBERT Weeks in Treatment: 2 Constitutional Sitting or standing Blood Pressure is within target range  for patient.. Pulse regular and within target range for patient.Marland Kitchen Respirations regular, non-labored and within target range.. Temperature is normal and within the target range for the patient.Marland Kitchen Appears in no distress. Cardiovascular Pedal pulses palpable. Edema present in both extremities. However his edema control is actually fairly good I do not think this is the issue. Notes Wound exam; area questions on the right lateral and left lower calf for the most part. The wounds are covered and very fibrinous surface debris which is very hard to debride. I tried using an open curette but got nowhere with this. I then changed to a #5 curette. I still was not able to get to what I think is a good surface. Hemostasis with direct pressure Electronic Signature(s) Signed: 10/25/2019 5:41:09 PM By: Linton Ham MD Entered By: Linton Ham on 10/25/2019 10:12:26 -------------------------------------------------------------------------------- Physician Orders Details Patient Name: Date of Service: [name], [name] 10/25/2019 8:30 AM Medical Record [id number] Patient Account Number: [id number] Date of Birth/Sex: Treating RN: 03-19-1941 (79 y.o. 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 - Thursday Dressing Change Frequency Other: - change  twice a week by home health. wound Center to change weekly. Skin Barriers/Peri-Wound Care Moisturizing lotion TCA Cream or Ointment - liberally in clinic today mixed with lotion. Wound Cleansing May shower with protection. - use cast protectors on the days dressings are not changed. May shower and wash wound with soap and water. - with dressing changes only. Primary Wound Dressing Wound #1 Left,Lateral Lower Leg Cutimed Sorbact - apply hydrogel over sorbact please. Wound #3 Right,Proximal,Lateral Lower Leg Cutimed Sorbact - apply hydrogel over sorbact please. Wound #4 Right,Distal,Lateral Lower Leg Cutimed Sorbact - apply hydrogel over sorbact please. Wound #5 Left,Medial Metatarsal head first Cutimed Sorbact - apply hydrogel over sorbact please. Secondary Dressing Wound #1 Left,Lateral Lower Leg ABD pad Wound #3 Right,Proximal,Lateral Lower Leg ABD pad Wound #4 Right,Distal,Lateral Lower Leg ABD pad Wound #5 Left,Medial Metatarsal head first Foam - foam donut Dry Gauze Edema Control 3 Layer Compression System - Bilateral - ensure to wrap from foot to just below calf. Avoid standing for long periods of time Elevate legs to the level of the heart or above for 30 minutes daily and/or when sitting, a frequency of: - throughout the day. Off-Loading Wound #5 Left,Medial Metatarsal head first Open toe surgical shoe to: - felt the shoe for left foot. [place] skilled [facility] for wound care. - [facility] home [facility]  Electronic Signature(s) Signed: 10/25/2019 5:41:09 PM By: Linton Ham MD Signed: 10/25/2019 6:07:18 PM By: Deon Pilling Entered By: Deon Pilling on 10/25/2019 09:34:10 -------------------------------------------------------------------------------- Problem List Details Patient Name: Date of Service: [name], [name] 10/25/2019 8:30 AM Medical Record [id number] Patient Account Number: [id number] Date of Birth/Sex: Treating  RN: 1940-09-27 (79 y.o. [name] Provider: Glennon Mac., ROBERT Other Clinician: Referring Provider: Treating Provider/Extender:Shabnam Ladd, Pamella Pert., Tacy Learn in Treatment: 2 Active Problems ICD-10 Evaluated Encounter Evaluated Encounter Code Description Active Date Today Diagnosis I87.323 Chronic venous hypertension (idiopathic) with 10/11/2019 No Yes inflammation of bilateral lower extremity I89.0 Lymphedema, not elsewhere classified 10/11/2019 No Yes L97.822 Non-pressure chronic ulcer of other part of left lower 10/11/2019 No Yes leg with fat layer exposed L97.112 Non-pressure chronic ulcer of right thigh with fat layer 10/11/2019 No Yes exposed L97.521 Non-pressure chronic ulcer of other part of left foot 10/11/2019 No Yes limited to breakdown of skin Inactive Problems Resolved Problems Electronic Signature(s) Signed: 10/25/2019 5:41:09 PM By: Linton Ham MD Entered By: Linton Ham on 10/25/2019 10:09:19 -------------------------------------------------------------------------------- Progress Note Details Patient Name: Date of Service: [name], [name] 10/25/2019 8:30 AM Medical Record [id number] Patient Account Number: [id number] Date of Birth/Sex: Treating RN: 1941/07/08 (79 y.o. [name] Provider: Glennon Mac., ROBERT Other Clinician: Referring Provider: Treating Provider/Extender:Cherae Marton, Pamella Pert., ROBERT Weeks in Treatment: 2 Subjective History of Present Illness (HPI) ADMISSION 10/11/2019 This is a 79 year old man with a history of a severe cardiomyopathy with an ejection fraction of about 20%, chronic renal failure stage III. He is listed as a type II diabetic in epic although the patient denies this. He also has a history of PVD. He states for the last month he has had wounds on his bilateral lower extremities that started off as blisters which denuded. He has areas on the left lateral calf and 2  on the right lateral. He has an area on the left first met head which he did not know was there he we identified this on intake. He has been using Silvadene cream provided by his primary care physician but he is complaining that this burns. 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 He was acutely ill at that time complicating GI bleed. ABIs in our clinic were 1.16 on the right and 1.13 on the left 2/25; the patient comes in with his areas on the left lateral and right lateral calf. There is also an area over the left first MTP bunion deformity. We have been using Sorbact. His edema control is fairly good Objective Constitutional Sitting or standing Blood Pressure is within target range for patient.. Pulse regular and within target range for patient.Marland Kitchen Respirations regular, non-labored and within target range.. Temperature is normal and within the target range for the patient.Marland Kitchen Appears in no distress. Vitals Time Taken: 8:55 AM, Height: 71 in, Weight: 198 lbs, BMI: 27.6, Temperature: 97.8 F, Pulse: 85 bpm, Respiratory Rate: 20 breaths/min, Blood Pressure: 119/72 mmHg. Cardiovascular Pedal pulses palpable. Edema present in both extremities. However his edema control is actually fairly good I do not think this is the issue. General Notes: Wound exam; area questions on the right lateral and left  lower calf for the most part. The wounds are covered and very fibrinous surface debris which is very hard to debride. I tried using an open curette but got nowhere with this. I then changed to a #5 curette. I still was not able to get to what I think  is a good surface. Hemostasis with direct pressure Integumentary (Hair, Skin) Wound #1 status is Open. Original cause of wound was Blister. The wound is located on the Left,Lateral Lower Leg. The wound measures 7.2cm length x 6.3cm width x 0.1cm depth; 35.626cm^2 area and 3.563cm^3 volume. There is Fat Layer (Subcutaneous Tissue) Exposed exposed. There is no tunneling or undermining noted. There is a medium amount of serous drainage noted. The wound margin is distinct with the outline attached to the wound base. There is small (1-33%) red, pink granulation within the wound bed. There is a large (67-100%) amount of necrotic tissue within the wound bed including Adherent Slough. Wound #2 status is Healed - Epithelialized. Original cause of wound was Blister. The wound is located on the [facility] Lower Leg. The wound measures 0cm length x 0cm width x 0cm depth; 0cm^2 area and 0cm^3 volume. There is no tunneling or undermining noted. There is a none present amount of drainage noted. The wound margin is distinct with the outline attached to the wound base. There is no granulation within the wound bed. There is no necrotic tissue within the wound bed. Wound #3 status is Open. Original cause of wound was Blister. The wound is located on the Right,Proximal,Lateral Lower Leg. The wound measures 2.9cm length x 2.2cm width x 0.1cm depth; 5.011cm^2 area and 0.501cm^3 volume. There is Fat Layer (Subcutaneous Tissue) Exposed exposed. There is no tunneling or undermining noted. There is a medium amount of serous drainage noted. The wound margin is distinct with the outline attached to the wound base. There is small (1-33%) red, pink granulation within the wound bed. There is a large (67-100%) amount of necrotic tissue within the wound bed including Adherent Slough. Wound #4 status is Open. Original cause of wound was Blister. The wound is located on the Right,Distal,Lateral Lower Leg. The wound  measures 2.4cm length x 3.3cm width x 0.1cm depth; 6.22cm^2 area and 0.622cm^3 volume. There is Fat Layer (Subcutaneous Tissue) Exposed exposed. There is no tunneling or undermining noted. There is a medium amount of serous drainage noted. The wound margin is distinct with the outline attached to the wound base. There is medium (34-66%) red, pink granulation within the wound bed. There is a medium (34-66%) amount of necrotic tissue within the wound bed including Adherent Slough. Wound #5 status is Open. Original cause of wound was Not Known. The wound is located on the Left,Medial Metatarsal head first. The wound measures 1cm length x 0.7cm width x 0.1cm depth; 0.55cm^2 area and 0.055cm^3 volume. There is Fat Layer (Subcutaneous Tissue) Exposed exposed. There is no tunneling or undermining noted. There is a small amount of serous drainage noted. The wound margin is distinct with the outline attached to the wound base. There is medium (34-66%) pink granulation within the wound bed. There is a medium (34-66%) amount of necrotic tissue within the wound bed including Eschar and Adherent Slough. 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 There was a Excisional Skin/Subcutaneous Tissue Debridement with a total area of 16 sq cm performed by Ricard Dillon., MD. With the following instrument(s): Curette to remove Viable and Non-Viable tissue/material. Material removed includes  Subcutaneous Tissue, Slough, Skin: Dermis, and Fibrin/Exudate. A time out was conducted at 09:28, prior to the start of the procedure. A Minimum amount of bleeding was controlled with Pressure. The procedure was tolerated well with a pain level of 0 throughout and a pain level of 0 following the procedure. Post Debridement Measurements: 7.2cm length x 6.3cm width x 0.1cm depth; 3.563cm^3 volume. Character of Wound/Ulcer Post Debridement requires further debridement. Severity of Tissue Post Debridement is: Fat layer exposed. Post procedure Diagnosis Wound #1: Same as Pre-Procedure Pre-procedure diagnosis of Wound #1 is a Venous Leg Ulcer located on the Left,Lateral Lower Leg . There was a Three Layer Compression Therapy Procedure with a pre-treatment ABI of 1.2 by Deon Pilling, RN. Post procedure Diagnosis Wound #1: Same as Pre-Procedure Wound #3 Pre-procedure diagnosis of Wound #3 is a Venous Leg Ulcer located on the Right,Proximal,Lateral Lower Leg .Severity of Tissue Pre Debridement is: Fat layer exposed. There was a Excisional Skin/Subcutaneous Tissue Debridement with a total area of 6.38 sq cm performed by Ricard Dillon., MD. With the following instrument(s): Curette to remove Viable and Non-Viable tissue/material. Material removed includes Subcutaneous Tissue, Slough, Skin: Dermis, and Fibrin/Exudate. A time out was conducted at 09:28, prior to the start of the procedure. A Minimum amount of bleeding was controlled with Pressure. The procedure was tolerated well with a pain level of 0 throughout and a pain level of 0 following the procedure. Post Debridement Measurements: 2.9cm length x 2.2cm width x 0.1cm depth; 0.501cm^3 volume. Character of Wound/Ulcer Post Debridement requires further debridement. Severity of Tissue Post Debridement is: Fat layer exposed. Post procedure Diagnosis Wound #3: Same as Pre-Procedure Pre-procedure diagnosis of Wound #3 is a Venous Leg Ulcer located on  the Right,Proximal,Lateral Lower Leg . There was a Three Layer Compression Therapy Procedure with a pre-treatment ABI of 1.2 by Deon Pilling, RN. Post procedure Diagnosis Wound #3: Same as Pre-Procedure Wound #4 Pre-procedure diagnosis of Wound #4 is a Venous Leg Ulcer located on the Right,Distal,Lateral Lower Leg .Severity of Tissue Pre Debridement is: Fat layer exposed. There was a Excisional Skin/Subcutaneous Tissue Debridement with a total area of 7.92 sq cm performed by Ricard Dillon., MD. With the following instrument(s): Curette to remove Viable and Non-Viable tissue/material. Material removed includes Subcutaneous Tissue, Slough, Skin: Dermis, and Fibrin/Exudate. A time out was conducted at 09:28, prior to the start of the procedure. A Minimum amount of bleeding was controlled with Pressure. The procedure was tolerated well with a pain level of 0 throughout and a pain level of 0 following the procedure. Post Debridement Measurements: 2.4cm length x 3.3cm width x 0.1cm depth; 0.622cm^3 volume. Character of Wound/Ulcer Post Debridement requires further debridement. Severity of Tissue Post Debridement is: Fat layer exposed. 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 There was a Three Layer Compression Therapy Procedure with a pre-treatment ABI of 1.2 by Deon Pilling, RN. Post procedure Diagnosis Wound #4: Same as Pre-Procedure Wound #5 Pre-procedure diagnosis of Wound #5 is a  Trauma, Other located on the Left,Medial Metatarsal head first . There was a Excisional Skin/Subcutaneous Tissue Debridement with a total area of 0.7 sq cm performed by Ricard Dillon., MD. With the following instrument(s): Curette to remove Viable and Non-Viable tissue/material. Material removed includes Subcutaneous Tissue, Slough, Skin: Dermis, and Fibrin/Exudate after achieving pain  control using Lidocaine 4% Topical Solution. A time out was conducted at 09:28, prior to the start of the procedure. A Minimum amount of bleeding was controlled with Pressure. The procedure was tolerated well with a pain level of 0 throughout and a pain level of 0 following the procedure. Post Debridement Measurements: 1cm length x 0.7cm width x 0.1cm depth; 0.055cm^3 volume. Character of Wound/Ulcer Post Debridement requires further debridement. Post procedure Diagnosis Wound #5: Same as Pre-Procedure Plan Follow-up Appointments: Return Appointment in 1 week. - Thursday Dressing Change Frequency: Other: - change twice a week by home health. wound Center to change weekly. Skin Barriers/Peri-Wound Care: Moisturizing lotion TCA Cream or Ointment - liberally in clinic today mixed with lotion. Wound Cleansing: May shower with protection. - use cast protectors on the days dressings are not changed. May shower and wash wound with soap and water. - with dressing changes only. Primary Wound Dressing: Wound #1 Left,Lateral Lower Leg: Cutimed Sorbact - apply hydrogel over sorbact please. Wound #3 Right,Proximal,Lateral Lower Leg: Cutimed Sorbact - apply hydrogel over sorbact please. Wound #4 Right,Distal,Lateral Lower Leg: Cutimed Sorbact - apply hydrogel over sorbact please. Wound #5 Left,Medial Metatarsal head first: Cutimed Sorbact - apply hydrogel over sorbact please. Secondary Dressing: Wound #1 Left,Lateral Lower Leg: ABD pad Wound #3 Right,Proximal,Lateral Lower Leg: ABD pad Wound #4 Right,Distal,Lateral Lower Leg: ABD pad Wound #5 Left,Medial Metatarsal head first: Foam - foam donut Dry Gauze Edema Control: 3 Layer Compression System - Bilateral - ensure to wrap from foot to just below calf. Avoid standing for long periods of time Elevate legs to the level of the heart or above for 30 minutes daily and/or when sitting, a frequency of: - throughout the day. Off-Loading: Wound  #5 Left,Medial Metatarsal head first: Open toe surgical shoe to: - felt the shoe for left foot. Home Health: [place] [facility] for wound care. - [facility] 1. I am continuing with Sorbac to these areas under compression. 2. He is having these changed by home health. There was some concern about the height of the compression that they are using hopefully they have supplies now. Electronic Signature(s) Signed: 10/25/2019 5:41:09 PM By: Linton Ham MD Entered By: Linton Ham on 10/25/2019 10:14:05 -------------------------------------------------------------------------------- SuperBill Details Patient Name: Date of Service: [name], [name] 10/25/2019 Medical Record [id number] Patient Account Number: [id number] Date of Birth/Sex: Treating RN: 1941-03-23 (79 y.o. 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 Facility Procedures The patient participates with Medicare or their insurance follows the Medicare Facility Guidelines: CPT4 Code Description Modifier Quantity [id number] 11042 - DEB SUBQ TISSUE 20 SQ CM/< 1 ICD-10 Diagnosis Description L97.822 Non-pressure chronic ulcer of  other part of left lower leg with fat layer  exposed 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 L97.521 Non-pressure chronic ulcer of other part of left foot limited to breakdown of  skin The patient participates with Medicare or their insurance follows the Medicare Facility Guidelines: [id number] 11045 - DEB SUBQ TISS EA ADDL 20CM 1 ICD-10 Diagnosis Description L97.521 Non-pressure chronic ulcer of other part of left foot limited to  breakdown of skin 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 Physician Procedures CPT4 Code Description: [id number] 11042 - WC PHYS SUBQ TISS 20 SQ CM ICD-10 Diagnosis Description L97.822 Non-pressure chronic ulcer of other part of left lower leg L97.112 Non-pressure chronic ulcer of right thigh with fat layer e L97.521 Non-pressure  chronic ulcer of other part of left foot limi Modifier: with fat laye xposed ted to breakdo Quantity: 1 r exposed wn of skin CPT4 Code Description: [id number] - WC PHYS SUBQ TISS EA ADDL 20 CM ICD-10 Diagnosis Description L97.521 Non-pressure chronic ulcer of other part of left foot limi L97.112 Non-pressure chronic ulcer of right thigh with fat layer e F02.637 Non-pressure  chronic ulcer of other part of left lower leg Modifier: ted to breakdo xposed with fat laye Quantity: 1 wn of skin r exposed Electronic Signature(s) Signed: 10/25/2019 5:41:09 PM By: Linton Ham MD Entered By: Linton Ham on 10/25/2019 10:14:16

## 2019-10-26 NOTE — Progress Notes (Signed)
LASHON, HILLIER ([id number]) Visit Report for 10/25/2019 Arrival Information Details Patient Name: Date of Service: [name], [name] 10/25/2019 8:30 AM Medical Record [id number] Patient Account Number: [id number] Date of Birth/Sex: Treating RN: Feb 28, 1941 (79 y.o. [name] Primary Care Melesio Madara: Glennon Mac., ROBERT Other Clinician: Referring Quanesha Klimaszewski: Treating Philana Younis/Extender:Robson, Pamella Pert., Tacy Learn in Treatment: 2 Visit Information History Since Last Visit Added or deleted any medications: No Patient Arrived: [name] Any new allergies or adverse reactions: No Arrival Time: 08:53 Had a fall or experienced change in No Accompanied By: self activities of daily living that may affect Transfer Assistance: None risk of falls: Patient Identification Verified: Yes Signs or symptoms of abuse/neglect since last No Secondary Verification Process Yes visito Completed: Hospitalized since last visit: No Patient Has Alerts: Yes Implantable device outside of the clinic excluding No Patient Alerts: Patient on Blood cellular tissue based products placed in the center Thinner since last visit: Has Dressing in Place as Prescribed: Yes Has Compression in Place as Prescribed: No Pain Present Now: Yes Electronic Signature(s) Signed: 10/26/2019 5:17:01 PM By: Kela Millin Entered By: Kela Millin on 10/25/2019 08:55:48 -------------------------------------------------------------------------------- Compression Therapy Details Patient Name: Date of Service: [name], [name] 10/25/2019 8:30 AM Medical Record [id number] Patient Account Number: [id number] Date of Birth/Sex: Treating RN: 1941-06-11 (79 y.o. [name] Marda Breidenbach: Glennon Mac., ROBERT Other Clinician: Referring Shaquera Ansley: Treating Anyae Griffith/Extender:Robson, Pamella Pert., ROBERT Weeks in Treatment: 2 Compression Therapy Performed for Wound Wound #1 Left,Lateral  Lower Leg Assessment: Performed By: Clinician Deon Pilling, RN Compression Type: Three Layer Pre Treatment ABI: 1.2 Post Procedure Diagnosis Same as Pre-procedure Electronic Signature(s) Signed: 10/25/2019 6:07:18 PM By: Deon Pilling Entered By: Deon Pilling on 10/25/2019 09:36:59 -------------------------------------------------------------------------------- Compression Therapy Details Patient Name: Date of Service: [name], [name] 10/25/2019 8:30 AM Medical Record [id number] Patient Account Number: [id number] Date of Birth/Sex: Treating RN: 1940/12/04 (79 y.o. [name] Cherrie Franca: Glennon Mac., ROBERT Other Clinician: Referring Elaina Cara: Treating Kimika Streater/Extender:Robson, Pamella Pert., ROBERT Weeks in Treatment: 2 Compression Therapy Performed for Wound Wound #4 Right,Distal,Lateral Lower Leg Assessment: Performed By: Clinician Deon Pilling, RN Compression Type: Three Layer Pre Treatment ABI: 1.2 Post Procedure Diagnosis Same as Pre-procedure Electronic Signature(s) Signed: 10/25/2019 6:07:18 PM By: Deon Pilling Entered By: Deon Pilling on 10/25/2019 09:36:59 -------------------------------------------------------------------------------- Compression Therapy Details Patient Name: Date of Service: [name], [name] 10/25/2019 8:30 AM Medical Record [id number] Patient Account Number: [id number] Date of Birth/Sex: Treating RN: [birth year] (79 y.o. [name] Indy Kuck: Glennon Mac., ROBERT Other Clinician: Referring Ermina Oberman: Treating Granite Godman/Extender:Robson, Pamella Pert., ROBERT Weeks in Treatment: 2 Compression Therapy Performed for Wound Wound #3 Right,Proximal,Lateral Lower Leg Assessment: Performed By: Clinician Deon Pilling, RN Compression Type: Three Layer Pre Treatment ABI: 1.2 Post Procedure Diagnosis Same as Pre-procedure Electronic Signature(s) Signed: 10/25/2019 6:07:18 PM By: Deon Pilling Entered By: Deon Pilling on 10/25/2019 09:36:59 -------------------------------------------------------------------------------- Encounter Discharge Information Details Patient Name: Date of Service: [name], [name] 10/25/2019 8:30 AM Medical Record [id number] Patient Account Number: [id number] Date of Birth/Sex: Treating RN: [birth year] (79 y.o. [name] Primary Care Mylasia Vorhees: Glennon Mac., ROBERT Other Clinician: Referring Edmonia Gonser: Treating Thom Ollinger/Extender:Robson, Pamella Pert., Tacy Learn in Treatment: 2 Encounter Discharge Information Items Post Procedure Vitals Discharge Condition: Stable Temperature (F): 97.8 Ambulatory Status: Cane Pulse (bpm): 85 Discharge Destination: Home Respiratory Rate (breaths/min): 18 Transportation: Other Blood Pressure (mmHg): 119/72 Accompanied By: self Schedule Follow-up Appointment: Yes Clinical Summary of Care: Patient Declined Notes SCAT Electronic Signature(s) Signed: 10/25/2019 5:28:31 PM By: Johna Roles,  [name], BSN Entered By: Baruch Gouty on 10/25/2019 10:14:13 -------------------------------------------------------------------------------- Lower Extremity Assessment Details Patient Name: Date of Service: [name], [name] 10/25/2019 8:30 AM Medical Record [id number] Patient Account Number: [id number] Date of Birth/Sex: Treating RN: [birth year] (79 y.o. [name] Primary Care Amely Voorheis: Glennon Mac., ROBERT Other Clinician: Referring Earnestine Tuohey: Treating Karesha Trzcinski/Extender:Robson, Pamella Pert., ROBERT Weeks in Treatment: 2 Edema Assessment Assessed: [Left: No] [Right: No] Edema: [Left: Yes] [Right: Yes] Calf Left: Right: Point of Measurement: 43 cm From Medial Instep 37.5 cm 37.5 cm Ankle Left: Right: Point of Measurement: 14 cm From Medial Instep 23.5 cm 23 cm Vascular Assessment Pulses: Dorsalis Pedis Palpable: [Left:No] [Right:No] Electronic Signature(s) Signed:  10/26/2019 5:17:01 PM By: Kela Millin Entered By: Kela Millin on 10/25/2019 08:59:27 -------------------------------------------------------------------------------- Multi Wound Chart Details Patient Name: Date of Service: [name], [name] 10/25/2019 8:30 AM Medical Record [id number] Patient Account Number: [id number] Date of Birth/Sex: Treating RN: 12-19-1940 (79 y.o. [name] Ashna Dorough: Glennon Mac., ROBERT Other Clinician: Referring Charnise Lovan: Treating Porshea Janowski/Extender:Robson, Pamella Pert., ROBERT Weeks in Treatment: 2 Vital Signs Height(in): 71 Pulse(bpm): 85 Weight(lbs): 198 Blood Pressure(mmHg): 119/72 Body Mass Index(BMI): 28 Temperature(F): 97.8 Respiratory 20 Rate(breaths/min): Photos: [1:No Photos] [2:No Photos] [3:No Photos] Wound Location: [1:Left Lower Leg - Lateral] [2:Left Lower Leg - Anterior, Right Lower Leg - Lateral, Distal] [3:Proximal] Wounding Event: [1:Blister] [2:Blister] [3:Blister] Primary Etiology: [1:Venous Leg Ulcer] [2:Venous Leg Ulcer] [3:Venous Leg Ulcer] Comorbid History: [1:Anemia, Congestive Heart Anemia, Congestive Heart Anemia, Congestive Heart Failure, Hypertension, Peripheral Venous Disease, Peripheral Venous Disease, Peripheral Venous Disease, 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 End  Stage Renal Disease] [2:Failure, Hypertension,] [3:Failure, Hypertension,] Date Acquired: [1:09/11/2019] [2:09/11/2019] [3:09/11/2019] Weeks of Treatment: [1:2] [2:2] [3:2] Wound Status: [1:Open] [2:Healed - Epithelialized] [3:Open] Clustered Wound: [1:Yes] [2:No] [3:No] Clustered Quantity: [1:4] [2:N/A] [3:N/A] Measurements L x W x D 7.2x6.3x0.1 [2:0x0x0] [3:2.9x2.2x0.1] (cm) Area (cm) : [1:35.626] [2:0] [3:5.011] Volume (cm) : [1:3.563] [2:0] [3:0.501] % Reduction in Area: [1:39.60%] [2:100.00%] [3:-1.30%] % Reduction in Volume: [1:39.50%] [2:100.00%] [3:-1.20%] Classification: [1:Full Thickness  Without Exposed Support Structures Exposed Support Structures Exposed Support Structures] [2:Full Thickness Without] [3:Full Thickness Without] Exudate Amount: [1:Medium] [2:None Present] [3:Medium] Exudate Type: [1:Serous] [2:N/A] [3:Serous] Exudate Color: [1:amber] [2:N/A] [3:amber] Wound Margin: [1:Distinct, outline attached Distinct, outline attached Distinct, outline attached] Granulation Amount: [1:Small (1-33%)] [2:None Present (0%)] [3:Small (1-33%)] Granulation Quality: [1:Red, Pink] [2:N/A] [3:Red, Pink] Necrotic Amount: [1:Large (67-100%)] [2:None Present (0%)] [3:Large (67-100%)] Necrotic Tissue: [1:Adherent Slough] [2:N/A] [3:Adherent Slough] Exposed Structures: [1:Fat Layer (Subcutaneous Fascia: No Tissue) Exposed: Yes Fascia: No Tendon: No Muscle: No Joint: No Bone: No] [2:Fat Layer (Subcutaneous Tissue) Exposed: Yes Tissue) Exposed: No Tendon: No Muscle: No Joint: No Bone: No] [3:Fat Layer (Subcutaneous  Fascia: No Tendon: No Muscle: No Joint: No Bone: No] Epithelialization: [1:Small (1-33%)] [2:Large (67-100%)] [3:None] Debridement: [1:Debridement - Excisional N/A] [3:Debridement - Excisional] Pre-procedure [1:09:28] [2:N/A] [3:09:28] Verification/Time Out Taken: Pain Control: [1:N/A] [2:N/A] [3:N/A] Tissue Debrided: [1:Subcutaneous, Slough] [2:N/A] [3:Subcutaneous, Slough] Level: [1:Skin/Subcutaneous Tissue] [2:N/A] [3:Skin/Subcutaneous Tissue] Debridement Area (sq cm):16 [2:N/A] [3:6.38] Instrument: [1:Curette] [2:N/A] [3:Curette] Bleeding: [1:Minimum] [2:N/A] [3:Minimum] Hemostasis Achieved: [1:Pressure] [2:N/A] [3:Pressure] Procedural Pain: [1:0] [2:N/A] [3:0] Post Procedural Pain: [1:0] [2:N/A] [3:0] Debridement Treatment Procedure was tolerated [2:N/A] [3:Procedure was tolerated] Response: [1:well] [3:well] Post Debridement [1:7.2x6.3x0.1] [2:N/A] [3:2.9x2.2x0.1] Measurements L x W x D (cm) Post Debridement [1:3.563] [2:N/A] [3:0.501] Volume: (cm) Procedures  Performed: Compression Therapy [1:Debridement 4] [2:N/A] [3:Compression Therapy Debridement 5] Photos: [1:No Photos] [2:No Photos] [3:N/A] Wound Location: [1:Right Lower  Leg - Lateral, Left Metatarsal head first - N/A Distal] [2:Medial] Wounding Event: [1:Blister] [2:Not Known] [3:N/A] Primary Etiology: [1:Venous Leg Ulcer] [2:Trauma, Other] [3:N/A] Comorbid History: [1:Anemia, Congestive Heart Anemia, Congestive Heart N/A Failure, Hypertension, Peripheral Venous Disease, Peripheral Venous Disease, 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] [2:Failure, Hypertension,] Date Acquired: [1:09/11/2019] [2:10/11/2019] [3:N/A] Weeks of Treatment: [1:2] [2:2] [3:N/A] Wound Status: [1:Open] [2:Open] [3:N/A] Clustered Wound: [1:No] [2:No] [3:N/A] Clustered Quantity: [1:N/A] [2:N/A] [3:N/A] Measurements L x W x D [1:2.4x3.3x0.1] [2:1x0.7x0.1] [3:N/A] (cm) Area (cm) : [1:6.22] [2:0.55] [3:N/A] Volume (cm) : [1:0.622] [2:0.055] [3:N/A] % Reduction in Area: [1:33.10%] [2:-45.90%] [3:N/A] % Reduction in Volume: [1:33.10%] [2:26.70%] [3:N/A] Classification: [1:Full Thickness Without Exposed Support Structures Exposed Support Structures] [2:Full Thickness Without] [3:N/A] Exudate Amount: [1:Medium] [2:Small] [3:N/A] Exudate Type: [1:Serous] [2:Serous] [3:N/A] Exudate Color: [1:amber] [2:amber] [3:N/A] Wound Margin: [1:Distinct, outline attached Distinct, outline attached N/A] Granulation Amount: [1:Medium (34-66%)] [2:Medium (34-66%)] [3:N/A] Granulation Quality: [1:Red, Pink] [2:Pink] [3:N/A] Necrotic Amount: [1:Medium (34-66%)] [2:Medium (34-66%)] [3:N/A] Necrotic Tissue: [1:Adherent Slough] [2:Eschar, Adherent Slough N/A] Exposed Structures: [1:Fat Layer (Subcutaneous Fat Layer (Subcutaneous N/A Tissue) Exposed: Yes Fascia: No Tendon: No Muscle: No Joint: No Bone: No] [2:Tissue) Exposed: Yes Fascia: No Tendon: No Muscle: No Joint: No Bone: No] Epithelialization: [1:None] [2:None]  [3:N/A] Debridement: [1:Debridement - Excisional Debridement - Excisional N/A] Pre-procedure [1:09:28] [2:09:28] [3:N/A] Verification/Time Out Taken: Pain Control: [1:N/A] [2:Lidocaine 4% Topical Solution] [3:N/A] Tissue Debrided: [1:Subcutaneous, Slough] [2:Subcutaneous, Slough] [3:N/A] Level: [1:Skin/Subcutaneous Tissue Skin/Subcutaneous Tissue] [3:N/A] Debridement Area (sq cm):7.92 [2:0.7] [3:N/A] Instrument: [1:Curette] [2:Curette] [3:N/A] Bleeding: [1:Minimum] [2:Minimum] [3:N/A] Hemostasis Achieved: [1:Pressure] [2:Pressure] [3:N/A] Procedural Pain: [1:0] [2:0] [3:N/A] Post Procedural Pain: [1:0] [2:0] [3:N/A] Debridement Treatment Procedure was tolerated [2:Procedure was tolerated] [3:N/A] Response: [1:well] [2:well] Post Debridement [1:2.4x3.3x0.1] [2:1x0.7x0.1] [3:N/A] Measurements L x W x D (cm) Post Debridement [1:0.622] [2:0.055] [3:N/A] Volume: (cm) Procedures Performed: Compression Therapy [1:Debridement] [2:Debridement] [3:N/A] Treatment Notes Electronic Signature(s) Signed: 10/25/2019 5:41:09 PM By: Linton Ham MD Signed: 10/25/2019 6:07:18 PM By: Deon Pilling Entered By: Linton Ham on 10/25/2019 10:09:50 -------------------------------------------------------------------------------- Multi-Disciplinary Care Plan Details Patient Name: Date of Service: [name], [name] 10/25/2019 8:30 AM Medical Record [id number] Patient Account Number: [id number] Date of Birth/Sex: Treating RN: [birth year] (79 y.o. [name] Marque Bango: Glennon Mac., ROBERT Other Clinician: Referring Julizza Sassone: Treating Rogan Ecklund/Extender:Robson, Pamella Pert., Tacy Learn in Treatment: 2 Active Inactive Abuse / Safety / Falls / Self Care Management Nursing Diagnoses: Potential for falls Goals: Patient will remain injury free related to falls Date Initiated: 10/11/2019 Target Resolution Date: 11/29/2019 Goal Status: Active Patient/caregiver will verbalize  understanding of the importance to maintain current immunizations/vaccinations Date Initiated: 10/11/2019 Target Resolution Date: 12/06/2019 Goal Status: Active Interventions: Assess self care needs on admission and as needed Provide education on fall prevention Treatment Activities: Patient referred to home care : 10/11/2019 Notes: Nutrition Nursing Diagnoses: Potential for alteratiion in Nutrition/Potential for imbalanced nutrition Goals: Patient/caregiver agrees to and verbalizes understanding of need to obtain nutritional consultation Date Initiated: 10/11/2019 Target Resolution Date: 11/09/2019 Goal Status: Active Patient/caregiver agrees to and verbalizes understanding of need to use nutritional supplements and/or vitamins as prescribed Date Initiated: 10/11/2019 Target Resolution Date: 11/09/2019 Goal Status: Active Interventions: Assess HgA1c results as ordered upon admission and as needed Provide education on nutrition Treatment Activities: Education provided on Nutrition : 10/11/2019 Obtain HgA1c : 10/11/2019 Patient referred to Primary Care Physician for further nutritional evaluation : 10/11/2019 Notes: Wound/Skin Impairment Nursing Diagnoses: Knowledge deficit related to ulceration/compromised skin integrity Goals: Patient/caregiver will verbalize  understanding of skin care regimen Date Initiated: 10/11/2019 Target Resolution Date: 11/09/2019 Goal Status: Active Interventions: Assess patient/caregiver ability to obtain necessary supplies Assess patient/caregiver ability to perform ulcer/skin care regimen upon admission and as needed Assess ulceration(s) every visit Provide education on ulcer and skin care Treatment Activities: Skin care regimen initiated : 10/11/2019 Topical wound management initiated : 10/11/2019 Notes: Electronic Signature(s) Signed: 10/25/2019 6:07:18 PM By: Deon Pilling Entered By: Deon Pilling on 10/25/2019  08:56:49 -------------------------------------------------------------------------------- Pain Assessment Details Patient Name: Date of Service: [name], [name] 10/25/2019 8:30 AM Medical Record [id number] Patient Account Number: [id number] Date of Birth/Sex: Treating RN: Nov 17, 1940 (79 y.o. [name] Primary Care Zamora Colton: Glennon Mac., ROBERT Other Clinician: Referring Beyonka Pitney: Treating Brion Sossamon/Extender:Robson, Pamella Pert., ROBERT Weeks in Treatment: 2 Active Problems Location of Pain Severity and Description of Pain Patient Has Paino Yes Site Locations Pain Location: Generalized Pain, Pain in Ulcers With Dressing Change: Yes Duration of the Pain. Constant / Intermittento Constant Rate the pain. Current Pain Level: 4 Worst Pain Level: 7 Least Pain Level: 4 Tolerable Pain Level: 4 Character of Pain Describe the Pain: Aching, Burning, Stabbing Pain Management and Medication Current Pain Management: Electronic Signature(s) Signed: 10/26/2019 5:17:01 PM By: Kela Millin Entered By: Kela Millin on 10/25/2019 08:58:15 -------------------------------------------------------------------------------- Patient/Caregiver Education Details Patient Name: Date of Service: [name] 2/25/2021andnbsp8:30 AM Medical Record [phone] Patient Account Number: [id number] Date of Birth/Gender: Treating RN: 1941-02-19 (79 y.o. [name] Physician: Glennon Mac., ROBERT Other Clinician: Referring Physician: Treating Physician/Extender:Robson, Pamella Pert., Tacy Learn in Treatment: 2 Education Assessment Education Provided To: Patient Education Topics Provided Wound/Skin Impairment: Handouts: Skin Care Do's and Dont's Methods: Explain/Verbal Responses: Reinforcements needed Electronic Signature(s) Signed: 10/25/2019 6:07:18 PM By: Deon Pilling Entered By: Deon Pilling on 10/25/2019  08:57:00 -------------------------------------------------------------------------------- Wound Assessment Details Patient Name: Date of Service: [name], [name] 10/25/2019 8:30 AM Medical Record [id number] Patient Account Number: [id number] Date of Birth/Sex: Treating RN: [birth year] (78 y.o. [name] Hiedi Touchton: Glennon Mac., ROBERT Other Clinician: Referring Sireen Halk: Treating Kapena Hamme/Extender:Robson, Pamella Pert., ROBERT Weeks in Treatment: 2 Wound Status Wound Number: 1 Primary Venous Leg Ulcer Etiology: Wound Location: Left Lower Leg - Lateral Wound Open Wounding Event: Blister Status: Date Acquired: 09/11/2019 Comorbid Anemia, Congestive Heart Failure, Weeks Of Treatment: 2 History: Hypertension, Peripheral Venous Disease, Clustered Wound: Yes End Stage Renal Disease Photos Wound Measurements Length: (cm) 7.2 % Reduct Width: (cm) 6.3 % Reduct Depth: (cm) 0.1 Epitheli Clustered Quantity: 4 Tunnelin Area: (cm) 35.626 Undermi Volume: (cm) 3.563 Wound Description Classification: Full Thickness Without Exposed Support Foul Odo Structures Slough/F Wound Distinct, outline attached Margin: Exudate Medium Amount: Exudate Serous Type: Exudate amber Color: Wound Bed Granulation Amount: Small (1-33%) Granulation Quality: Red, Pink Fascia E Necrotic Amount: Large (67-100%) Fat Laye Necrotic Quality: Adherent Slough Tendon E Muscle E Joint Exp Bone Expo r After Cleansing: No ibrino Yes Exposed Structure xposed: No r (Subcutaneous Tissue) Exposed: Yes xposed: No xposed: No osed: No sed: No ion in Area: 39.6% ion in Volume: 39.5% alization: Small (1-33%) g: No ning: No Treatment Notes Wound #1 (Left, Lateral Lower Leg) 2. Periwound Care Barrier cream Moisturizing lotion TCA Cream 3. Primary Dressing Applied Other primary dressing (specifiy in notes) 4. Secondary Dressing ABD Pad Dry Gauze 6. Support Layer Applied 3  layer compression wrap Notes sorbact swab Electronic Signature(s) Signed: 10/25/2019 5:25:06 PM By: Mikeal Hawthorne EMT/HBOT Signed: 10/25/2019 6:07:18 PM By: Deon Pilling Entered By: Mikeal Hawthorne on 10/25/2019 14:31:29 -------------------------------------------------------------------------------- Wound  Assessment Details Patient Name: Date of Service: [name], [name] 10/25/2019 8:30 AM Medical Record [id number] Patient Account Number: [id number] Date of Birth/Sex: Treating RN: 04/30/1941 (79 y.o. [name] Vinaya Sancho: Glennon Mac., ROBERT Other Clinician: Referring Demya Scruggs: Treating Lamees Gable/Extender:Robson, Pamella Pert., ROBERT Weeks in Treatment: 2 Wound Status Wound Number: 2 Primary Venous Leg Ulcer Etiology: Wound Location: Left Lower Leg - Anterior, Distal Wound Healed - Epithelialized Wounding Event: Blister Status: Date Acquired: 09/11/2019 Comorbid Anemia, Congestive Heart Failure, Weeks Of Treatment: 2 History: Hypertension, Peripheral Venous Disease, Clustered Wound: No End Stage Renal Disease Photos Wound Measurements Length: (cm) 0 % Reduct Width: (cm) 0 % Reduct Depth: (cm) 0 Epitheli Area: (cm) 0 Tunneli Volume: (cm) 0 Undermi Wound Description Classification: Full Thickness Without Exposed Support Foul Odo Structures Slough/F Wound Distinct, outline attached Margin: Exudate None Present Amount: Wound Bed Granulation Amount: None Present (0%) Necrotic Amount: None Present (0%) Fascia E Fat Laye Tendon E Muscle E Joint Ex Bone Exp r After Cleansing: No ibrino No Exposed Structure xposed: No r (Subcutaneous Tissue) Exposed: No xposed: No xposed: No posed: No osed: No ion in Area: 100% ion in Volume: 100% alization: Large (67-100%) ng: No ning: No Electronic Signature(s) Signed: 10/25/2019 5:25:06 PM By: Mikeal Hawthorne EMT/HBOT Signed: 10/25/2019 6:07:18 PM By: Deon Pilling Entered By: Mikeal Hawthorne  on 10/25/2019 14:31:57 -------------------------------------------------------------------------------- Wound Assessment Details Patient Name: Date of Service: [name], KNICKERBOCKER 10/25/2019 8:30 AM Medical Record [id number] Patient Account Number: [id number] Date of Birth/Sex: Treating RN: 1941/07/23 (79 y.o. [name] Danon Lograsso: Glennon Mac., ROBERT Other Clinician: Referring Khaleef Ruby: Treating Kylen Ismael/Extender:Robson, Pamella Pert., ROBERT Weeks in Treatment: 2 Wound Status Wound Number: 3 Primary Venous Leg Ulcer Right Lower Leg - Lateral, Proximal Etiology: Wound Location: Wound Open Wounding Event: Blister Wounding Event: Blister Status: Date Acquired: 09/11/2019 Comorbid Anemia, Congestive Heart Failure, Weeks Of Treatment: 2 History: Hypertension, Peripheral Venous Disease, Clustered Wound: No End Stage Renal Disease Photos Wound Measurements Length: (cm) 2.9 % Reduct Width: (cm) 2.2 % Reduct Depth: (cm) 0.1 Epitheli Area: (cm) 5.011 Tunneli Volume: (cm) 0.501 Undermi Wound Description Classification: Full Thickness Without Exposed Support Foul Odo Structures Slough/F Wound Distinct, outline attached Margin: Exudate Medium Amount: Exudate Serous Type: Exudate amber Color: Wound Bed Granulation Amount: Small (1-33%) Granulation Quality: Red, Pink Fascia E Necrotic Amount: Large (67-100%) Fat Laye Necrotic Quality: Adherent Slough Tendon E Muscle E Joint Ex Bone Exp r After Cleansing: No ibrino Yes Exposed Structure xposed: No r (Subcutaneous Tissue) Exposed: Yes xposed: No xposed: No posed: No osed: No ion in Area: -1.3% ion in Volume: -1.2% alization: None ng: No ning: No Treatment Notes Wound #3 (Right, Proximal, Lateral Lower Leg) 2. Periwound Care Barrier cream Moisturizing lotion TCA Cream 3. Primary Dressing Applied Other primary dressing (specifiy in notes) 4. Secondary Dressing ABD Pad Dry  Gauze 6. Support Layer Applied 3 layer compression wrap Electronic Signature(s) Signed: 10/25/2019 5:25:06 PM By: Mikeal Hawthorne EMT/HBOT Signed: 10/25/2019 6:07:18 PM By: Deon Pilling Entered By: Mikeal Hawthorne on 10/25/2019 14:32:36 -------------------------------------------------------------------------------- Wound Assessment Details Patient Name: Date of Service: EUAN, WANDLER 10/25/2019 8:30 AM Medical Record [id number] Patient Account Number: [id number] Date of Birth/Sex: Treating RN: [birth year] (79 y.o. [name] Tyrica Afzal: Glennon Mac., ROBERT Other Clinician: Referring Marguriete Wootan: Treating Nehemie Casserly/Extender:Robson, Pamella Pert., ROBERT Weeks in Treatment: 2 Wound Status Wound Number: 4 Primary Venous Leg Ulcer Etiology: Wound Location: Right Lower Leg - Lateral, Distal Wound Open Wounding Event: Blister Status: Date Acquired:  09/11/2019 Comorbid Anemia, Congestive Heart Failure, Weeks Of Treatment: 2 History: Hypertension, Peripheral Venous Disease, Clustered Wound: No End Stage Renal Disease Photos Wound Measurements Length: (cm) 2.4 % Reduct Width: (cm) 3.3 % Reduct Depth: (cm) 0.1 Epitheli Area: (cm) 6.22 Tunneli Volume: (cm) 0.622 Undermi Wound Description Full Thickness Without Exposed Support Foul Odo Classification: Structures Slough/F Wound Distinct, outline attached Margin: Exudate Medium Amount: Exudate Serous Type: Exudate amber Color: Wound Bed Granulation Amount: Medium (34-66%) Granulation Quality: Red, Pink Fascia Necrotic Amount: Medium (34-66%) Fat Lay Necrotic Quality: Adherent Slough Tendon Muscle Joint E Bone Ex r After Cleansing: No ibrino Yes Exposed Structure Exposed: No er (Subcutaneous Tissue) Exposed: Yes Exposed: No Exposed: No xposed: No posed: No ion in Area: 33.1% ion in Volume: 33.1% alization: None ng: No ning: No Treatment Notes Wound #4 (Right, Distal, Lateral Lower  Leg) 2. Periwound Care Barrier cream Moisturizing lotion TCA Cream 3. Primary Dressing Applied Other primary dressing (specifiy in notes) 4. Secondary Dressing ABD Pad Dry Gauze 6. Support Layer Applied 3 layer compression wrap Notes sorbact swab Electronic Signature(s) Signed: 10/25/2019 5:25:06 PM By: Mikeal Hawthorne EMT/HBOT Signed: 10/25/2019 6:07:18 PM By: Deon Pilling Entered By: Mikeal Hawthorne on 10/25/2019 14:32:57 -------------------------------------------------------------------------------- Wound Assessment Details Patient Name: Date of Service: WOLFGANG, FINIGAN 10/25/2019 8:30 AM Medical Record [id number] Patient Account Number: [id number] Date of Birth/Sex: Treating RN: August 17, 1941 (79 y.o. [name] Genifer [name]: Glennon Mac., ROBERT Other Clinician: Referring Estefany Goebel: Treating Meosha Castanon/Extender:Robson, Pamella Pert., ROBERT Weeks in Treatment: 2 Wound Status Wound Number: 5 Primary Trauma, Other Etiology: Wound Location: Left Metatarsal head first - Medial Wound Open Wounding Event: Not Known Status: Date Acquired: 10/11/2019 Comorbid Anemia, Congestive Heart Failure, Weeks Of Treatment: 2 History: Hypertension, Peripheral Venous Disease, Clustered Wound: No End Stage Renal Disease Photos Wound Measurements Length: (cm) 1 % Reduct Width: (cm) 0.7 % Reduct Depth: (cm) 0.1 Epitheli Area: (cm) 0.55 Tunneli Volume: (cm) 0.055 Undermi Wound Description Classification: Full Thickness Without Exposed Support Foul Odo Structures Slough/F Wound Distinct, outline attached Margin: Exudate Small Amount: Exudate Serous Type: Exudate amber Color: Wound Bed Granulation Amount: Medium (34-66%) Granulation Quality: Pink Fascia E Necrotic Amount: Medium (34-66%) Fat Laye Necrotic Quality: Eschar, Adherent Slough Tendon E Muscle E Joint Ex Bone Exp r After Cleansing: No ibrino Yes Exposed Structure xposed: No r  (Subcutaneous Tissue) Exposed: Yes xposed: No xposed: No posed: No osed: No ion in Area: -45.9% ion in Volume: 26.7% alization: None ng: No ning: No Treatment Notes Wound #5 (Left, Medial Metatarsal head first) 2. Periwound Care Moisturizing lotion TCA Cream 3. Primary Dressing Applied Other primary dressing (specifiy in notes) 4. Secondary Dressing Dry Gauze Foam 7. Footwear/Offloading device applied Surgical shoe Notes sorbact swab Electronic Signature(s) Signed: 10/25/2019 5:25:06 PM By: Mikeal Hawthorne EMT/HBOT Signed: 10/25/2019 6:07:18 PM By: Deon Pilling Entered By: Mikeal Hawthorne on 10/25/2019 14:33:29 -------------------------------------------------------------------------------- Vitals Details Patient Name: Date of Service: ZAIN, LANKFORD 10/25/2019 8:30 AM Medical Record [id number] Patient Account Number: [id number] Date of Birth/Sex: Treating RN: 08-20-1941 (79 y.o. [name] Primary Care Brunella Wileman: Glennon Mac., ROBERT Other Clinician: Referring Deshia Vanderhoof: Treating Masai Kidd/Extender:Robson, Pamella Pert., ROBERT Weeks in Treatment: 2 Vital Signs Time Taken: 08:55 Temperature (F): 97.8 Height (in): 71 Pulse (bpm): 85 Weight (lbs): 198 Respiratory Rate (breaths/min): 20 Body Mass Index (BMI): 27.6 Blood Pressure (mmHg): 119/72 Reference Range: 80 - 120 mg / dl Electronic Signature(s) Signed: 10/26/2019 5:17:01 PM By: Kela Millin Entered By: Kela Millin on 10/25/2019 08:56:08

## 2019-11-01 ENCOUNTER — Encounter (HOSPITAL_BASED_OUTPATIENT_CLINIC_OR_DEPARTMENT_OTHER): Payer: Medicare Other | Attending: Internal Medicine | Admitting: Internal Medicine

## 2019-11-01 ENCOUNTER — Other Ambulatory Visit: Payer: Self-pay

## 2019-11-01 DIAGNOSIS — N183 Chronic kidney disease, stage 3 unspecified: Secondary | ICD-10-CM | POA: Diagnosis not present

## 2019-11-01 DIAGNOSIS — L97822 Non-pressure chronic ulcer of other part of left lower leg with fat layer exposed: Secondary | ICD-10-CM | POA: Insufficient documentation

## 2019-11-01 DIAGNOSIS — L97112 Non-pressure chronic ulcer of right thigh with fat layer exposed: Secondary | ICD-10-CM | POA: Diagnosis not present

## 2019-11-01 DIAGNOSIS — I130 Hypertensive heart and chronic kidney disease with heart failure and stage 1 through stage 4 chronic kidney disease, or unspecified chronic kidney disease: Secondary | ICD-10-CM | POA: Diagnosis not present

## 2019-11-01 DIAGNOSIS — I890 Lymphedema, not elsewhere classified: Secondary | ICD-10-CM | POA: Diagnosis not present

## 2019-11-01 DIAGNOSIS — E11621 Type 2 diabetes mellitus with foot ulcer: Secondary | ICD-10-CM | POA: Diagnosis not present

## 2019-11-01 DIAGNOSIS — E1122 Type 2 diabetes mellitus with diabetic chronic kidney disease: Secondary | ICD-10-CM | POA: Insufficient documentation

## 2019-11-01 DIAGNOSIS — L97521 Non-pressure chronic ulcer of other part of left foot limited to breakdown of skin: Secondary | ICD-10-CM | POA: Insufficient documentation

## 2019-11-01 DIAGNOSIS — I87312 Chronic venous hypertension (idiopathic) with ulcer of left lower extremity: Secondary | ICD-10-CM | POA: Insufficient documentation

## 2019-11-01 DIAGNOSIS — I429 Cardiomyopathy, unspecified: Secondary | ICD-10-CM | POA: Insufficient documentation

## 2019-11-01 DIAGNOSIS — E1151 Type 2 diabetes mellitus with diabetic peripheral angiopathy without gangrene: Secondary | ICD-10-CM | POA: Insufficient documentation

## 2019-11-01 DIAGNOSIS — I509 Heart failure, unspecified: Secondary | ICD-10-CM | POA: Insufficient documentation

## 2019-11-01 NOTE — Progress Notes (Signed)
SYLIS, KETCHUM ([id number]) Visit Report for 11/01/2019 Debridement Details Patient Name: Date of Service: [name], [name] 11/01/2019 8:30 AM Medical Record [id number] Patient Account Number: [id number] Date of Birth/Sex: Treating RN: [birth year] (79 y.o. [name] Provider: Glennon Mac., ROBERT Other Clinician: Referring Provider: Treating Provider/Extender:Demontre Padin, Pamella Pert., ROBERT Weeks in Treatment: 3 Debridement Performed for Wound #1 Left,Lateral Lower Leg Assessment: Performed By: Physician Ricard Dillon., MD Debridement Type: Debridement Severity of Tissue Pre Fat layer exposed Debridement: Level of Consciousness (Pre- Awake and Alert procedure): Pre-procedure Verification/Time Out Taken: Yes - 08:48 Start Time: 08:49 Pain Control: Other : benzocaine 20% Total Area Debrided (L x W): 7 (cm) x 4 (cm) = 28 (cm) Tissue and other material Viable, Non-Viable, Slough, Subcutaneous, Skin: Dermis , Fibrin/Exudate, Slough debrided: Level: Skin/Subcutaneous Tissue Debridement Description: Excisional Instrument: Curette Bleeding: Minimum Hemostasis Achieved: Pressure End Time: 08:52 Procedural Pain: 0 Post Procedural Pain: 2 Response to Treatment: Procedure was tolerated well Level of Consciousness Awake and Alert (Post-procedure): Post Debridement Measurements of Total Wound Length: (cm) 7.2 Width: (cm) 4.3 Depth: (cm) 0.2 Volume: (cm) 4.863 Character of Wound/Ulcer Post Requires Further Debridement Debridement: Severity of Tissue Post Debridement: Fat layer exposed Post Procedure Diagnosis Same as Pre-procedure Electronic Signature(s) Signed: 11/01/2019 5:41:03 PM By: Linton Ham MD Signed: 11/01/2019 6:05:23 PM By: Deon Pilling Entered By: Linton Ham on 11/01/2019 09:02:55 -------------------------------------------------------------------------------- Debridement Details Patient Name: Date of Service: [name], [name] 11/01/2019 8:30 AM Medical Record [id number] Patient Account Number: [id number] Date of Birth/Sex: Treating RN: March 14, 1941 (79 y.o. [name] Provider: Glennon Mac., ROBERT Other Clinician: Referring Provider: Treating Provider/Extender:Schneider Warchol, Pamella Pert., ROBERT Weeks in Treatment: 3 Debridement Performed for Wound #3 Right,Proximal,Lateral Lower Leg Assessment: Performed By: Physician Ricard Dillon., MD Debridement Type: Debridement Severity of Tissue Pre Fat layer exposed Debridement: Level of Consciousness (Pre- Awake and Alert procedure): Pre-procedure Yes - 08:48 Verification/Time Out Taken: Start Time: 08:49 Pain Control: Other : benzocaine 20% Total Area Debrided (L x W): 2.8 (cm) x 2 (cm) = 5.6 (cm) Tissue and other material Viable, Non-Viable, Slough, Subcutaneous, Skin: Dermis , Fibrin/Exudate, Slough debrided: Level: Skin/Subcutaneous Tissue Debridement Description: Excisional Instrument: Curette Bleeding: Minimum Hemostasis Achieved: Pressure End Time: 08:52 Procedural Pain: 0 Post Procedural Pain: 2 Response to Treatment: Procedure was tolerated well Level of Consciousness Awake and Alert (Post-procedure): Post Debridement Measurements of Total Wound Length: (cm) 2.8 Width: (cm) 2 Depth: (cm) 0.1 Volume: (cm) 0.44 Character of Wound/Ulcer Post Requires Further Debridement Debridement: Severity of Tissue Post Debridement: Fat layer exposed Post Procedure Diagnosis Same as Pre-procedure Electronic Signature(s) Signed: 11/01/2019 5:41:03 PM By: Linton Ham MD Signed: 11/01/2019 6:05:23 PM By: Deon Pilling Entered By: Linton Ham on 11/01/2019 09:03:04 -------------------------------------------------------------------------------- HPI Details Patient Name: Date of Service: [name], [name] 11/01/2019 8:30 AM Medical Record [id number] Patient Account Number: [id number] Date of  Birth/Sex: Treating RN: 1941-05-24 (79 y.o. [name] Provider: Glennon Mac., ROBERT Other Clinician: Referring Provider: Treating Provider/Extender:Adaya Garmany, Pamella Pert., ROBERT Weeks in Treatment: 3 History of Present Illness HPI Description: ADMISSION 10/11/2019 This is a 79 year old man with a history of a severe cardiomyopathy with an ejection fraction of about 20%, chronic renal failure stage III. He is listed as a type II diabetic in epic although the patient denies this. He also has a history of PVD. He states for the last month he has had wounds on his bilateral lower extremities that started off as blisters which denuded. He has areas  on the left lateral calf and 2 on the right lateral. He has an area on the left first met head which he did not know was there he we identified this on intake. He has been using Silvadene cream provided by his primary care physician but he is complaining that this burns. 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 He was acutely ill at that time complicating GI bleed. ABIs in our clinic were 1.16 on the right and 1.13 on the left 2/25; the patient comes in with his areas on the left lateral and right lateral calf. There is also an area over the left first MTP bunion deformity. We have been using Sorbact. His edema control is fairly good 3/4; left lateral and right lateral calf. Most of his wounds are in the same position tightly adherent nonviable debris. On the right we debrided the superior wound on the left both wounds. The area on his bunion over the left first  MTP medially is I think just about closed. We have been using Sorbact without a lot of success changed to Iodoflex under compression Electronic Signature(s) Signed: 11/01/2019 5:41:03 PM By: Linton Ham MD Entered By: Linton Ham on 11/01/2019 09:04:20 -------------------------------------------------------------------------------- Physical Exam Details Patient Name: Date of Service: [name], [name] 11/01/2019 8:30 AM Medical Record [id number] Patient Account Number: [id number] Date of Birth/Sex: Treating RN: 1941-04-06 (79 y.o. [name] Provider: Glennon Mac., ROBERT Other Clinician: Referring Provider: Treating Provider/Extender:Rondy Krupinski, Pamella Pert., ROBERT Weeks in Treatment: 3 Cardiovascular Pedal pulses are palpable. Much better edema control. Integumentary (Hair, Skin) Changes of chronic venous insufficiency. Notes Wound exam; area questions on the right lateral and left lateral calf. Most of the wounds are covered with a very fibrinous surface debris all debrided with a #5 curette except for the inferior ones on the right lateral. The area on the first MTP on the left I think is closing over. No evidence of infection in either area. We have good edema control bilaterally Electronic Signature(s) Signed: 11/01/2019 5:41:03 PM By: Linton Ham MD Entered By: Linton Ham on 11/01/2019 09:05:46 -------------------------------------------------------------------------------- Physician Orders Details Patient Name: Date of Service: KENNAN, DETTER 11/01/2019 8:30 AM Medical Record [id number] Patient Account Number: [id number] Date of Birth/Sex: Treating RN: November 03, 1940 (79 y.o. 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 - Thursday Dressing Change Frequency Other: - change twice a week by home health. wound Center to change weekly. Skin Barriers/Peri-Wound Care Moisturizing lotion TCA Cream or Ointment - liberally in clinic today mixed with lotion. Wound Cleansing May shower with protection. - use cast protectors on the days dressings are not changed. May shower and wash wound  with soap and water. - with dressing changes only. Primary Wound Dressing Wound #1 Left,Lateral Lower Leg Iodoflex - if home health does not have iodoflex may use iodosorb ointment. Wound #3 Right,Proximal,Lateral Lower Leg Iodoflex - if home health does not have iodoflex may use iodosorb ointment. Wound #4 Right,Distal,Lateral Lower Leg Iodoflex - if home health does not have iodoflex may use iodosorb ointment. Wound #5 Left,Medial Metatarsal head first Iodoflex - if home health does not have iodoflex may use iodosorb ointment. Secondary Dressing Wound #1 Left,Lateral Lower Leg ABD pad Wound #3 Right,Proximal,Lateral Lower Leg ABD pad Wound #4 Right,Distal,Lateral Lower Leg ABD pad Wound #5 Left,Medial Metatarsal head first Foam - foam donut Dry Gauze Edema Control 3 Layer Compression System - Bilateral - ensure to wrap from foot to just below calf. Avoid standing for long periods of time Elevate legs to the level of the heart or above for 30 minutes daily and/or when sitting, a frequency of: - throughout the day. Off-Loading Wound #5 Left,Medial Metatarsal head  first Open toe surgical shoe to: - felt the shoe for left foot. [place] [facility] for [facility] care. [facility] Electronic Signature(s) Signed: 11/01/2019 5:41:03 PM By: Linton Ham MD Signed: 11/01/2019 6:05:23 PM By: Deon Pilling Entered By: Deon Pilling on 11/01/2019 08:53:34 -------------------------------------------------------------------------------- Problem List Details Patient Name: Date of Service: CARSON, BOGDEN 11/01/2019 8:30 AM Medical Record [id number] Patient Account Number: [id number] Date of Birth/Sex: Treating RN: 12-15-1940 (79 y.o. [name] Provider: Glennon Mac., ROBERT Other Clinician: Referring Provider: Treating Provider/Extender:Aylssa Herrig, Pamella Pert., Tacy Learn in Treatment: 3 Active Problems ICD-10 Evaluated Encounter Code Description Active Date Today Diagnosis I87.323 Chronic venous hypertension (idiopathic) with 10/11/2019 No Yes inflammation of bilateral lower extremity I89.0 Lymphedema, not elsewhere classified 10/11/2019 No Yes L97.822 Non-pressure chronic ulcer of other part of left lower 10/11/2019 No Yes leg with fat layer exposed L97.112 Non-pressure chronic ulcer of right thigh with fat layer 10/11/2019 No Yes exposed L97.521 Non-pressure chronic ulcer of other part of left foot 10/11/2019 No Yes limited to breakdown of skin Inactive Problems Resolved Problems Electronic Signature(s) Signed: 11/01/2019 5:41:03 PM By: Linton Ham MD Entered By: Linton Ham on 11/01/2019 09:02:40 -------------------------------------------------------------------------------- Progress Note Details Patient Name: Date of Service: WADSWORTH, SKOLNICK 11/01/2019 8:30 AM Medical Record [id number] Patient Account Number: [id number] Date of Birth/Sex: Treating RN: 08-04-1941 (79 y.o. [name] Provider: Glennon Mac., ROBERT Other Clinician: Referring Provider:  Treating Provider/Extender:Hurschel Paynter, Pamella Pert., ROBERT Weeks in Treatment: 3 Subjective History of Present Illness (HPI) ADMISSION 10/11/2019 This is a 79 year old man with a history of a severe cardiomyopathy with an ejection fraction of about 20%, chronic renal failure stage III. He is listed as a type II diabetic in epic although the patient denies this. He also has a history of PVD. He states for the last month he has had wounds on his bilateral lower extremities that started off as blisters which denuded. He has areas on the left lateral calf and 2 on the right lateral. He has an area on the left first met head which he did not know was there he we identified this on intake. He has been using Silvadene cream provided by his primary care physician but he is complaining that this burns. 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 He was acutely ill at that time complicating GI bleed. ABIs in our clinic were 1.16 on the right and 1.13 on the left 2/25; the patient comes in with his areas on the left lateral and right lateral calf. There is also an area over the left first MTP bunion deformity. We have been using Sorbact. His edema control is fairly good 3/4; left lateral and right lateral calf. Most of his wounds are in the same position tightly adherent nonviable debris. On the right we debrided the superior wound on the left both wounds. The area on his bunion over the left first MTP medially is I think just about closed. We have been using Sorbact without a lot of success changed to Iodoflex under  compression Objective Constitutional Vitals Time Taken: 8:14 AM, Height: 71 in, Weight: 198 lbs, BMI: 27.6, Pulse: 76 bpm, Respiratory Rate: 22 breaths/min, Blood Pressure: 121/75 mmHg. Cardiovascular Pedal pulses are palpable. Much better edema control. General Notes: Wound exam; area questions on the right lateral and left lateral calf. Most of the wounds are covered with a very fibrinous surface debris all debrided with a #5 curette except for the inferior ones on the right lateral. The area on the first MTP on the left I think is closing over. No evidence of infection in either area. We have good edema control bilaterally Integumentary (Hair, Skin) Changes of chronic venous insufficiency. Wound #1 status is Open. Original cause of wound was Blister. The wound is located on the Left,Lateral Lower Leg. The wound measures 7.2cm length x 4.3cm width x 0.2cm depth; 24.316cm^2 area and 4.863cm^3 volume. There is Fat Layer (Subcutaneous Tissue) Exposed exposed. There is no tunneling or undermining noted. There is a medium amount of serosanguineous drainage noted. The wound margin is distinct with the outline attached to the wound base. There is small (1-33%) pink granulation within the wound bed. There is a large (67-100%) amount of necrotic tissue within the wound bed including Adherent Slough. Wound #3 status is Open. Original cause of wound was Blister. The wound is located on the Right,Proximal,Lateral Lower Leg. The wound measures 2.8cm length x 2cm width x 0.1cm depth; 4.398cm^2 area and 0.44cm^3 volume. There is Fat Layer (Subcutaneous Tissue) Exposed exposed. There is no tunneling or undermining noted. There is a medium amount of serosanguineous drainage noted. The wound margin is distinct with the outline attached to the wound base. There is small (1-33%) pink granulation within the wound bed. There is a large (67-100%) amount of necrotic tissue within the wound bed including Adherent  Slough. Wound #4 status is Open. Original cause of wound was Blister. The wound is located on the Right,Distal,Lateral Lower Leg. The wound measures 2.6cm length x 2.8cm width x 0.1cm depth; 5.718cm^2 area and 0.572cm^3 volume. There is Fat Layer (Subcutaneous Tissue) Exposed exposed. There is no tunneling or undermining noted. There is a medium amount of serosanguineous drainage noted. The wound margin is distinct with the outline attached to the wound base. There is medium (34-66%) pink granulation within the wound bed. There is a medium (34-66%) amount of necrotic tissue within the wound bed including Adherent Slough. Wound #5 status is Open. Original cause of wound was Not Known. The wound is located on the Left,Medial Metatarsal head first. The wound measures 0.8cm length x 0.6cm width x 0.1cm  depth; 0.377cm^2 area and 0.038cm^3 volume. There is Fat Layer (Subcutaneous Tissue) Exposed exposed. There is no tunneling or undermining noted. There is a small amount of serosanguineous drainage noted. The wound margin is distinct with the outline attached to the wound base. There is no granulation within the wound bed. There is a large (67-100%) amount of necrotic tissue within the wound bed including Eschar. 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 There was a Excisional Skin/Subcutaneous Tissue Debridement with a total area of 28 sq cm performed by Ricard Dillon., MD. With  the following instrument(s): Curette to remove Viable and Non-Viable tissue/material. Material removed includes Subcutaneous Tissue, Slough, Skin: Dermis, and Fibrin/Exudate after achieving pain control using Other (benzocaine 20%). A time out was conducted at 08:48, prior to the start of the procedure. A Minimum amount of bleeding was controlled with Pressure. The procedure was tolerated well with a pain level of 0 throughout and a pain level of 2 following the procedure. Post Debridement Measurements: 7.2cm length x 4.3cm width x 0.2cm depth; 4.863cm^3 volume. Character of Wound/Ulcer Post Debridement requires further debridement. Severity of Tissue Post Debridement is: Fat layer exposed. Post procedure Diagnosis Wound #1: Same as Pre-Procedure Pre-procedure diagnosis of Wound #1 is a Venous Leg Ulcer located on the Left,Lateral Lower Leg . There was a Three Layer Compression Therapy Procedure with a pre-treatment ABI of 1.1 by Levan Hurst, RN. Post procedure Diagnosis Wound #1: Same as Pre-Procedure Wound #3 Pre-procedure diagnosis of Wound #3 is a Venous Leg Ulcer located on the Right,Proximal,Lateral Lower Leg .Severity of Tissue Pre Debridement is: Fat layer exposed. There was a Excisional Skin/Subcutaneous Tissue Debridement with a total area of 5.6 sq cm performed by Ricard Dillon., MD. With the following instrument(s): Curette to remove Viable and Non-Viable tissue/material. Material removed includes Subcutaneous Tissue, Slough, Skin: Dermis, and Fibrin/Exudate after achieving pain control using Other (benzocaine 20%). A time out was conducted at 08:48, prior to the start of the procedure. A Minimum amount of bleeding was controlled with Pressure. The procedure was tolerated well with a pain level of 0 throughout and a pain level of 2 following the procedure. Post Debridement Measurements: 2.8cm length x 2cm width x 0.1cm depth; 0.44cm^3 volume. Character of Wound/Ulcer Post  Debridement requires further debridement. Severity of Tissue Post Debridement is: Fat layer exposed. 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 There was a Three Layer Compression Therapy Procedure with a pre-treatment ABI of 1.1 by Levan Hurst, RN. Post procedure Diagnosis Wound #3: Same as Pre-Procedure Wound #4 Pre-procedure diagnosis of Wound #4 is a Venous Leg Ulcer located on the Right,Distal,Lateral Lower Leg . There was a Three Layer Compression Therapy Procedure with a pre-treatment ABI of 1.1 by Levan Hurst, RN. Post procedure Diagnosis Wound #4: Same as Pre-Procedure Wound #5 Pre-procedure diagnosis of Wound #5 is a Trauma, Other located on the Left,Medial Metatarsal head first . There was a Three Layer Compression Therapy  Procedure with a pre-treatment ABI of 1.1 by Levan Hurst, RN. Post procedure Diagnosis Wound #5: Same as Pre-Procedure Plan Follow-up Appointments: Return Appointment in 1 week. - Thursday Dressing Change Frequency: Other: - change twice a week by home health. wound Center to change weekly. Skin Barriers/Peri-Wound Care: Moisturizing lotion TCA Cream or Ointment - liberally in clinic today mixed with lotion. Wound Cleansing: May shower with protection. - use cast protectors on the days dressings are not changed. May shower and wash wound with soap and water. - with dressing changes only. Primary Wound Dressing: Wound #1 Left,Lateral Lower Leg: Iodoflex - if home health does not have iodoflex may use iodosorb ointment. Wound #3 Right,Proximal,Lateral Lower Leg: Iodoflex - if home health does not have iodoflex may use iodosorb ointment. Wound #4 Right,Distal,Lateral Lower Leg: Iodoflex - if home health does not have iodoflex may use iodosorb ointment. Wound #5 Left,Medial Metatarsal head first: Iodoflex - if home health does  not have iodoflex may use iodosorb ointment. Secondary Dressing: Wound #1 Left,Lateral Lower Leg: ABD pad Wound #3 Right,Proximal,Lateral Lower Leg: ABD pad Wound #4 Right,Distal,Lateral Lower Leg: ABD pad Wound #5 Left,Medial Metatarsal head first: Foam - foam donut Dry Gauze Edema Control: 3 Layer Compression System - Bilateral - ensure to wrap from foot to just below calf. Avoid standing for long periods of time Elevate legs to the level of the heart or above for 30 minutes daily and/or when sitting, a frequency of: - throughout the day. Off-Loading: Wound #5 Left,Medial Metatarsal head first: Open toe surgical shoe to: - felt the shoe for left foot. Home Health: [facility] for wound care. - [facility] 1. I change the primary dressing to Iodoflex to all wound areas 2. I suspect the area over the bunion in the first MTP to be closed over. This is something that may need to be addressed by podiatry. 3. His edema control is a lot better unfortunately the wound surface is not quite as good today aggressive debridement Electronic Signature(s) Signed: 11/01/2019 5:41:03 PM By: Linton Ham MD Entered By: Linton Ham on 11/01/2019 09:07:15 -------------------------------------------------------------------------------- SuperBill Details Patient Name: Date of Service: [name], [name] 11/01/2019 Medical Record [id number] Patient Account Number: [id number] Date of Birth/Sex: Treating RN: [birth year] (79 y.o. 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 Facility Procedures The patient participates with Medicare or their insurance follows the Medicare Facility Guidelines: CPT4 Code Description Modifier Quantity [phone] 11042 - DEB SUBQ TISSUE 20 SQ CM/< 1 ICD-10 Diagnosis Description L97.822 Non-pressure chronic ulcer of  other part of left lower leg with fat layer exposed 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 The patient participates with Medicare or their insurance follows the Medicare Facility Guidelines: [id number] 11045 - DEB SUBQ TISS EA ADDL 20CM 1 ICD-10 Diagnosis Description L97.521 Non-pressure chronic ulcer of other part of left foot limited to  breakdown of skin 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 L97.822 Non-pressure chronic ulcer of other part of left lower  leg with fat layer exposed Physician Procedures CPT4 Code Description: [id number] 11042 - WC PHYS SUBQ TISS 20 SQ CM ICD-10 Diagnosis Description L97.822 Non-pressure 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

## 2019-11-01 NOTE — Progress Notes (Addendum)
Troy, Patton ([id number]) Visit Report for 11/01/2019 Arrival Information Details Patient Name: Date of Service: [name], [name] 11/01/2019 8:30 AM Medical Record [id number] Patient Account Number: [id number] Date of Birth/Sex: Treating RN: Nov 14, 1940 (79 y.o. Male) Levan Hurst Primary Care Provider: Glennon Mac., [place] Other Clinician: Referring Provider: Treating Provider/Extender:Robson, Pamella Pert., ROBERT Weeks in Treatment: 3 Visit Information History Since Last Visit Added or deleted any medications: No Patient Arrived: [name] Any new allergies or adverse reactions: No Arrival Time: 08:13 Had a fall or experienced change in No Accompanied By: alone activities of daily living that may affect Transfer Assistance: None risk of falls: Patient Identification Verified: Yes Signs or symptoms of abuse/neglect since last No Secondary Verification Process Yes visito Completed: Hospitalized since last visit: No Patient Requires Transmission- No Implantable device outside of the clinic excluding No Based Precautions: cellular tissue based products placed in the center Patient Has Alerts: Yes since last visit: Patient Alerts: Patient on Blood Has Dressing in Place as Prescribed: Yes Thinner Pain Present Now: No Electronic Signature(s) Signed: 11/01/2019 9:29:33 AM By: Levan Hurst RN, BSN Entered By: Levan Hurst on 11/01/2019 08:13:55 -------------------------------------------------------------------------------- Compression Therapy Details Patient Name: Date of Service: Troy, Patton 11/01/2019 8:30 AM Medical Record [id number] Patient Account Number: [id number] Date of Birth/Sex: Treating RN: Apr 13, 1941 (79 y.o. Male) Deon Pilling Primary Care Provider: Glennon Mac., ROBERT Other Clinician: Referring Provider: Treating Provider/Extender:Robson, Pamella Pert., ROBERT Weeks in Treatment: 3 Compression Therapy Performed for Wound Wound #1  Left,Lateral Lower Leg Assessment: Performed By: Clinician Levan Hurst, RN Compression Type: Three Layer Pre Treatment ABI: 1.1 Post Procedure Diagnosis Same as Pre-procedure Electronic Signature(s) Signed: 11/01/2019 6:05:23 PM By: Deon Pilling Entered By: Deon Pilling on 11/01/2019 08:52:47 -------------------------------------------------------------------------------- Compression Therapy Details Patient Name: Date of Service: Troy, Patton 11/01/2019 8:30 AM Medical Record [id number] Patient Account Number: [id number] Date of Birth/Sex: Treating RN: 02/04/1941 (79 y.o. Male) Deon Pilling Primary Care Provider: Glennon Mac., Herbie [place] Other Clinician: Referring Provider: Treating Provider/Extender:Robson, Pamella Pert., ROBERT Weeks in Treatment: 3 Compression Therapy Performed for Wound Wound #3 Right,Proximal,Lateral Lower Leg Assessment: Performed By: Clinician Levan Hurst, RN Compression Type: Three Layer Pre Treatment ABI: 1.1 Post Procedure Diagnosis Same as Pre-procedure Electronic Signature(s) Signed: 11/01/2019 6:05:23 PM By: Deon Pilling Entered By: Deon Pilling on 11/01/2019 08:52:47 -------------------------------------------------------------------------------- Compression Therapy Details Patient Name: Date of Service: Troy, Patton 11/01/2019 8:30 AM Medical Record [id number] Patient Account Number: [id number] Date of Birth/Sex: Treating RN: October 29, 1940 (79 y.o. Male) Deon Pilling Primary Care Provider: Glennon Mac., Herbie [place] Other Clinician: Referring Provider: Treating Provider/Extender:Robson, Pamella Pert., ROBERT Weeks in Treatment: 3 Compression Therapy Performed for Wound Wound #4 Right,Distal,Lateral Lower Leg Assessment: Performed By: Clinician Levan Hurst, RN Compression Type: Three Layer Pre Treatment ABI: 1.1 Post Procedure Diagnosis Same as Pre-procedure Electronic Signature(s) Signed: 11/01/2019 6:05:23 PM By:  Deon Pilling Entered By: Deon Pilling on 11/01/2019 08:52:48 -------------------------------------------------------------------------------- Compression Therapy Details Patient Name: Date of Service: Troy, Patton 11/01/2019 8:30 AM Medical Record [id number] Patient Account Number: [id number] Date of Birth/Sex: Treating RN: 1941/03/07 (79 y.o. Male) Deon Pilling Primary Care Provider: Glennon Mac., Herbie [place] Other Clinician: Referring Provider: Treating Provider/Extender:Robson, Pamella Pert., ROBERT Weeks in Treatment: 3 Compression Therapy Performed for Wound Wound #5 Left,Medial Metatarsal head first Assessment: Performed By: Clinician Levan Hurst, RN Compression Type: Three Layer Pre Treatment ABI: 1.1 Post Procedure Diagnosis Same as Pre-procedure Electronic Signature(s) Signed: 11/01/2019 6:05:23 PM By: Deon Pilling Entered By: Deon Pilling on 11/01/2019 08:52:48 -------------------------------------------------------------------------------- Encounter Discharge Information Details Patient Name:  Date of Service: Troy, Patton 11/01/2019 8:30 AM Medical Record [id number] Patient Account Number: [id number] Date of Birth/Sex: Treating RN: 1940-12-21 (79 y.o. Male) Levan Hurst Primary Care Provider: Glennon Mac., [place] Other Clinician: Referring Provider: Treating Provider/Extender:Robson, Pamella Pert., ROBERT Weeks in Treatment: 3 Encounter Discharge Information Items Post Procedure Vitals Discharge Condition: Stable Temperature (F): 97.5 Ambulatory Status: Cane Pulse (bpm): 76 Discharge Destination: Home Respiratory Rate (breaths/min): 22 Transportation: Private Auto Blood Pressure (mmHg): 121/75 Accompanied By: alone Schedule Follow-up Appointment: Yes Clinical Summary of Care: Patient Declined Electronic Signature(s) Signed: 11/01/2019 9:29:33 AM By: Levan Hurst RN, BSN Entered By: Levan Hurst on 11/01/2019  09:13:22 -------------------------------------------------------------------------------- Lower Extremity Assessment Details Patient Name: Date of Service: Troy, Patton 11/01/2019 8:30 AM Medical Record [id number] Patient Account Number: [id number] Date of Birth/Sex: Treating RN: 12-Mar-1941 (79 y.o. Male) Levan Hurst Primary Care Provider: Glennon Mac., [place] Other Clinician: Referring Provider: Treating Provider/Extender:Robson, Pamella Pert., ROBERT Weeks in Treatment: 3 Edema Assessment Assessed: [Left: No] [Right: No] Edema: [Left: Yes] [Right: Yes] Calf Left: Right: Point of Measurement: 43 cm From Medial Instep 36.8 cm 36 cm Ankle Left: Right: Point of Measurement: 14 cm From Medial Instep 23.2 cm 23 cm Vascular Assessment Pulses: Dorsalis Pedis Palpable: [Left:Yes] [Right:Yes] Electronic Signature(s) Signed: 11/01/2019 9:29:33 AM By: Levan Hurst RN, BSN Entered By: Levan Hurst on 11/01/2019 08:23:16 -------------------------------------------------------------------------------- Multi Wound Chart Details Patient Name: Date of Service: [name] 11/01/2019 8:30 AM Medical Record [id number] Patient Account Number: [id number] Date of Birth/Sex: Treating RN: 1941-02-04 (79 y.o. Male) Deon Pilling Primary Care Provider: Glennon Mac., [place] Other Clinician: Referring Provider: Treating Provider/Extender:Robson, Pamella Pert., ROBERT Weeks in Treatment: 3 Vital Signs Height(in): 71 Pulse(bpm): 29 Weight(lbs): 198 Blood Pressure(mmHg): 121/75 Body Mass Index(BMI): 28 Temperature(F): Respiratory 22 Rate(breaths/min): Photos: [1:No Photos] [3:No Photos] [4:No Photos] Wound Location: [1:Left Lower Leg - Lateral] [3:Right Lower Leg - Lateral, Right Lower Leg - Lateral, Proximal] [4:Distal] Wounding Event: [1:Blister] [3:Blister] [4:Blister] Primary Etiology: [1:Venous Leg Ulcer] [3:Venous Leg Ulcer] [4:Venous Leg Ulcer] Comorbid  History: [1:Anemia, Congestive Heart Anemia, Congestive Heart Anemia, Congestive Heart Failure, Hypertension, Peripheral Venous Disease, Peripheral Venous Disease, Peripheral Venous Disease, 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 End  Stage Renal Disease] [3:Failure, Hypertension,] [4:Failure, Hypertension,] Date Acquired: [1:09/11/2019] [3:09/11/2019] [4:09/11/2019] Weeks of Treatment: [1:3] [3:3] [4:3] Wound Status: [1:Open] [3:Open] [4:Open] Clustered Wound: [1:Yes] [3:No] [4:No] Clustered Quantity: [1:4] [3:N/A] [4:N/A] Measurements L x W x D 7.2x4.3x0.2 [3:2.8x2x0.1] [4:2.6x2.8x0.1] (cm) Area (cm) : [1:24.316] [3:4.398] [4:5.718] Volume (cm) : [1:4.863] [3:0.44] [4:0.572] % Reduction in Area: [1:58.70%] [3:11.10%] [4:38.50%] % Reduction in Volume: 17.50% [3:11.10%] [4:38.50%] Classification: [1:Full Thickness Without Exposed Support Structures Exposed Support Structures Exposed Support Structures] [3:Full Thickness Without] [4:Full Thickness Without] Exudate Amount: [1:Medium] [3:Medium] [4:Medium] Exudate Type: [1:Serosanguineous] [3:Serosanguineous] [4:Serosanguineous] Exudate Color: [1:red, brown] [3:red, brown] [4:red, brown] Wound Margin: [1:Distinct, outline attached Distinct, outline attached Distinct, outline attached] Granulation Amount: [1:Small (1-33%)] [3:Small (1-33%)] [4:Medium (34-66%)] Granulation Quality: [1:Pink] [3:Pink] [4:Pink] Necrotic Amount: [1:Large (67-100%)] [3:Large (67-100%)] [4:Medium (34-66%)] Necrotic Tissue: [1:Adherent Slough] [3:Adherent Slough] [4:Adherent Slough] Exposed Structures: [1:Fat Layer (Subcutaneous Fat Layer (Subcutaneous Fat Layer (Subcutaneous Tissue) Exposed: Yes Fascia: No Tendon: No Muscle: No Joint: No Bone: No] [3:Tissue) Exposed: Yes Fascia: No Tendon: No Muscle: No Joint: No Bone: No] [4:Tissue) Exposed: Yes  Fascia: No Tendon: No Muscle: No Joint: No Bone: No] Epithelialization: [1:Small (1-33%)] [3:Small (1-33%)]  [4:Small (1-33%)] Debridement: [1:Debridement - Excisional Debridement - Excisional N/A] Pre-procedure [1:08:48] [3:08:48] [4:N/A] Verification/Time Out Taken: Pain  Control: [1:Other] [3:Other] [4:N/A] Tissue Debrided: [1:Subcutaneous, Slough] [3:Subcutaneous, Slough] [4:N/A] Level: [1:Skin/Subcutaneous Tissue Skin/Subcutaneous Tissue N/A] Debridement Area (sq cm):28 [3:5.6] [4:N/A] Instrument: [1:Curette] [3:Curette] [4:N/A] Bleeding: [1:Minimum] [3:Minimum] [4:N/A] Hemostasis Achieved: [1:Pressure] [3:Pressure] [4:N/A] Procedural Pain: [1:0] [3:0] [4:N/A] Post Procedural Pain: [1:2] [3:2] [4:N/A] Debridement Treatment [1:Procedure was tolerated] [3:Procedure was tolerated] [4:N/A] Response: [1:well] [3:well] Post Debridement [1:7.2x4.3x0.2] [3:2.8x2x0.1] [4:N/A] Measurements L x W x D (cm) Post Debridement [1:4.863] [3:0.44] [4:N/A] Volume: (cm) Procedures Performed: [1:Compression Therapy Debridement 5] [3:Compression Therapy Debridement N/A] [4:Compression Therapy N/A] Photos: [1:No Photos] [3:N/A] [4:N/A] Wound Location: [1:Left Metatarsal head first - N/A Medial] [4:N/A] Wounding Event: [1:Not Known] [3:N/A] [4:N/A] Primary Etiology: [1:Trauma, Other] [3:N/A] [4:N/A] Comorbid History: [1:Anemia, Congestive Heart N/A Failure, Hypertension, Peripheral Venous Disease, End Stage Renal Disease] [4:N/A] Date Acquired: [1:10/11/2019] [3:N/A] [4:N/A] Weeks of Treatment: [1:3] [3:N/A] [4:N/A] Wound Status: [1:Open] [3:N/A] [4:N/A] Clustered Wound: [1:No] [3:N/A] [4:N/A] Clustered Quantity: [1:N/A] [3:N/A] [4:N/A] Measurements L x W x D 0.8x0.6x0.1 [3:N/A] [4:N/A] (cm) Area (cm) : [1:0.377] [3:N/A] [4:N/A] Volume (cm) : [1:0.038] [3:N/A] [4:N/A] % Reduction in Area: [1:0.00%] [3:N/A] [4:N/A] % Reduction in Volume: 49.30% [3:N/A] [4:N/A] Classification: [1:Full Thickness Without Exposed Support Structures] [3:N/A] [4:N/A] Exudate Amount: [1:Small] [3:N/A] [4:N/A] Exudate Type:  [1:Serosanguineous] [3:N/A] [4:N/A] Exudate Color: [1:red, brown] [3:N/A] [4:N/A] Wound Margin: [1:Distinct, outline attached N/A] [4:N/A] Granulation Amount: [1:None Present (0%)] [3:N/A] [4:N/A] Granulation Quality: [1:N/A] [3:N/A] [4:N/A] Necrotic Amount: [1:Large (67-100%)] [3:N/A] [4:N/A] Necrotic Tissue: [1:Eschar] [3:N/A] [4:N/A] Exposed Structures: [1:Fat Layer (Subcutaneous N/A Tissue) Exposed: Yes Fascia: No Tendon: No Muscle: No Joint: No Bone: No] [4:N/A] Epithelialization: [1:None] [3:N/A] [4:N/A] Debridement: [1:N/A] [3:N/A] [4:N/A] Pain Control: [1:N/A] [3:N/A] [4:N/A] Tissue Debrided: [1:N/A] [3:N/A] [4:N/A] Level: [1:N/A] [3:N/A] [4:N/A] Debridement Area (sq cm):N/A [3:N/A] [4:N/A] Instrument: [1:N/A] [3:N/A] [4:N/A] Bleeding: [1:N/A] [3:N/A] [4:N/A] Hemostasis Achieved: [1:N/A] [3:N/A] [4:N/A] Procedural Pain: [1:N/A] [3:N/A] [4:N/A] Post Procedural Pain: [1:N/A] [3:N/A] [4:N/A] Debridement Treatment N/A [3:N/A] [4:N/A] Response: Post Debridement [1:N/A] [3:N/A] [4:N/A] Measurements L x W x D (cm) Post Debridement [1:N/A] [3:N/A] [4:N/A] Volume: (cm) Procedures Performed: Compression Therapy [3:N/A] [4:N/A] Treatment Notes Electronic Signature(s) Signed: 11/01/2019 5:41:03 PM By: Linton Ham MD Signed: 11/01/2019 6:05:23 PM By: Deon Pilling Entered By: Linton Ham on 11/01/2019 09:02:46 -------------------------------------------------------------------------------- Multi-Disciplinary Care Plan Details Patient Name: Date of Service: KEANON, BEVINS 11/01/2019 8:30 AM Medical Record [id number] Patient Account Number: [id number] Date of Birth/Sex: Treating RN: [birth year] (79 y.o. Male) Deon Pilling Primary Care Provider: Glennon Mac., ROBERT Other Clinician: Referring Provider: Treating Provider/Extender:Robson, Pamella Pert., ROBERT Weeks in Treatment: 3 Active Inactive Abuse / Safety / Falls / Self Care Management Nursing  Diagnoses: Potential for falls Goals: Patient will remain injury free related to falls Date Initiated: 10/11/2019 Target Resolution Date: 11/29/2019 Goal Status: Active Patient/caregiver will verbalize understanding of the importance to maintain current immunizations/vaccinations Date Initiated: 10/11/2019 Target Resolution Date: 12/06/2019 Goal Status: Active Interventions: Assess self care needs on admission and as needed Provide education on fall prevention Treatment Activities: Patient referred to home care : 10/11/2019 Notes: Nutrition Nursing Diagnoses: Potential for alteratiion in Nutrition/Potential for imbalanced nutrition Goals: Patient/caregiver agrees to and verbalizes understanding of need to obtain nutritional consultation Date Initiated: 10/11/2019 Date Inactivated: 11/01/2019 Target Resolution Date: 11/09/2019 Goal Status: Met Patient/caregiver agrees to and verbalizes understanding of need to use nutritional supplements and/or vitamins as prescribed Date Initiated: 10/11/2019 Target Resolution Date: 11/23/2019 Goal Status: Active Interventions: Assess HgA1c results as ordered upon admission and as needed Provide education on nutrition Treatment Activities: Education provided on Nutrition :  10/11/2019 Obtain HgA1c : 10/11/2019 Patient referred to Primary Care Physician for further nutritional evaluation : 10/11/2019 Notes: Electronic Signature(s) Signed: 11/01/2019 6:05:23 PM By: Deon Pilling Entered By: Deon Pilling on 11/01/2019 08:45:28 -------------------------------------------------------------------------------- Pain Assessment Details Patient Name: Date of Service: JARIS, KOHLES 11/01/2019 8:30 AM Medical Record [id number] Patient Account Number: [id number] Date of Birth/Sex: Treating RN: Jan 18, 1941 (79 y.o. Male) Levan Hurst Primary Care Provider: Glennon Mac., [place] Other Clinician: Referring Provider: Treating Provider/Extender:Robson,  Pamella Pert., ROBERT Weeks in Treatment: 3 Active Problems Location of Pain Severity and Description of Pain Patient Has Paino No Site Locations Pain Management and Medication Current Pain Management: Electronic Signature(s) Signed: 11/01/2019 9:29:33 AM By: Levan Hurst RN, BSN Entered By: Levan Hurst on 11/01/2019 08:18:40 -------------------------------------------------------------------------------- Patient/Caregiver Education Details Patient Name: Date of Service: [name] 3/4/2021andnbsp8:30 AM Medical Record Patient Account Number: [id number] Number: Treating RN: Deon Pilling Date of Birth/Gender: [birth year] (79 y.o. Male) Other Clinician: Primary Care Physician: Glennon Mac., [place] Treating Physician/Extender:Robson, Legrand Como Referring Physician: Glennon Mac., [place] Learn in Treatment: 3 Education Assessment Education Provided To: Patient Education Topics Provided Nutrition: Handouts: Nutrition Methods: Explain/Verbal Responses: Reinforcements needed Electronic Signature(s) Signed: 11/01/2019 6:05:23 PM By: Deon Pilling Entered By: Deon Pilling on 11/01/2019 08:45:43 -------------------------------------------------------------------------------- Wound Assessment Details Patient Name: Date of Service: GILBERTO, STANFORTH 11/01/2019 8:30 AM Medical Record [id number] Patient Account Number: [id number] Date of Birth/Sex: Treating RN: 1941/02/10 (79 y.o. Male) Deon Pilling Primary Care Provider: Glennon Mac., [place] Other Clinician: Referring Provider: Treating Provider/Extender:Robson, Pamella Pert., ROBERT Weeks in Treatment: 3 Wound Status Wound Number: 1 Primary Venous Leg Ulcer Etiology: Wound Location: Left Lower Leg - Lateral Wound Open Wounding Event: Blister Status: Date Acquired: 09/11/2019 Comorbid Anemia, Congestive Heart Failure, Weeks Of Treatment: 3 History: Hypertension, Peripheral Venous Disease, Clustered  Wound: Yes End Stage Renal Disease Photos Wound Measurements Length: (cm) 7.2 % Reduct Width: (cm) 4.3 % Reduct Depth: (cm) 0.2 Epitheli Clustered Quantity: 4 Tunnelin Area: (cm) 24.316 Undermi Volume: (cm) 4.863 Wound Description Full Thickness Without Exposed Support Foul Odo Classification: Structures Slough/F Wound Distinct, outline attached Margin: Exudate Medium Amount: Exudate Serosanguineous Type: Exudate red, brown Color: Wound Bed Granulation Amount: Small (1-33%) Granulation Quality: Pink Fascia E Necrotic Amount: Large (67-100%) Fat Laye Necrotic Quality: Adherent Slough Tendon E Muscle E Joint E Bone Ex r After Cleansing: No ibrino Yes Exposed Structure xposed: No r (Subcutaneous Tissue) Exposed: Yes xposed: No xposed: No xposed: No posed: No ion in Area: 58.7% ion in Volume: 17.5% alization: Small (1-33%) g: No ning: No Treatment Notes Wound #1 (Left, Lateral Lower Leg) 1. Cleanse With Soap and water 2. Periwound Care Moisturizing lotion TCA Ointment 3. Primary Dressing Applied Iodoflex 4. Secondary Dressing ABD Pad 6. Support Layer Applied 3 layer compression wrap Electronic Signature(s) Signed: 11/05/2019 4:54:41 PM By: Mikeal Hawthorne EMT/HBOT Signed: 11/05/2019 5:42:31 PM By: Deon Pilling Previous Signature: 11/01/2019 9:29:33 AM Version By: Levan Hurst RN, BSN Entered By: Mikeal Hawthorne on 11/05/2019 13:29:35 -------------------------------------------------------------------------------- Wound Assessment Details Patient Name: Date of Service: BLAKELY, GLUTH 11/01/2019 8:30 AM Medical Record [id number] Patient Account Number: [id number] Date of Birth/Sex: Treating RN: 1940/11/04 (79 y.o. Male) Deon Pilling Primary Care Provider: Glennon Mac., Herbie [place] Other Clinician: Referring Provider: Treating Provider/Extender:Robson, Pamella Pert., ROBERT Weeks in Treatment: 3 Wound Status Wound Number: 3 Primary Venous Leg  Ulcer Etiology: Wound Location: Right Lower Leg - Lateral, Proximal Wound Open Wounding Event: Blister Status: Date Acquired: 09/11/2019 Comorbid Anemia, Congestive Heart Failure, Weeks Of Treatment: 3 History: Hypertension,  Peripheral Venous Disease, Clustered Wound: No End Stage Renal Disease Photos Wound Measurements Length: (cm) 2.8 % Reduct Width: (cm) 2 % Reduct Depth: (cm) 0.1 Epitheli Area: (cm) 4.398 Tunneli Volume: (cm) 0.44 Undermi Wound Description Classification: Full Thickness Without Exposed Support Foul Odo Structures Slough/F Wound Distinct, outline attached Margin: Exudate Medium Amount: Exudate Serosanguineous Type: Exudate red, brown Color: Wound Bed Granulation Amount: Small (1-33%) Granulation Quality: Pink Fascia E Necrotic Amount: Large (67-100%) Fat Laye Necrotic Quality: Adherent Slough Tendon E Muscle E Joint Ex Bone Exp r After Cleansing: No ibrino Yes Exposed Structure xposed: No r (Subcutaneous Tissue) Exposed: Yes xposed: No xposed: No posed: No osed: No ion in Area: 11.1% ion in Volume: 11.1% alization: Small (1-33%) ng: No ning: No Treatment Notes Wound #3 (Right, Proximal, Lateral Lower Leg) 1. Cleanse With Soap and water 2. Periwound Care Moisturizing lotion TCA Ointment 3. Primary Dressing Applied Iodoflex 4. Secondary Dressing ABD Pad 6. Support Layer Applied 3 layer compression wrap Electronic Signature(s) Signed: 11/05/2019 4:54:41 PM By: Mikeal Hawthorne EMT/HBOT Signed: 11/05/2019 5:42:31 PM By: Deon Pilling Previous Signature: 11/01/2019 9:29:33 AM Version By: Levan Hurst RN, BSN Entered By: Mikeal Hawthorne on 11/05/2019 13:28:29 -------------------------------------------------------------------------------- Wound Assessment Details Patient Name: Date of Service: ESCHOL, AUXIER 11/01/2019 8:30 AM Medical Record [id number] Patient Account Number: [id number] Date of Birth/Sex: Treating  RN: 1941-06-04 (79 y.o. Male) Deon Pilling Primary Care Provider: Other Clinician: Amedeo Kinsman Referring Provider: Treating Provider/Extender:Robson, Pamella Pert., ROBERT Weeks in Treatment: 3 Wound Status Wound Number: 4 Primary Venous Leg Ulcer Etiology: Wound Location: Right Lower Leg - Lateral, Distal Wound Open Wounding Event: Blister Status: Date Acquired: 09/11/2019 Comorbid Anemia, Congestive Heart Failure, Weeks Of Treatment: 3 History: Hypertension, Peripheral Venous Disease, Clustered Wound: No End Stage Renal Disease Photos Wound Measurements Length: (cm) 2.6 % Reduct Width: (cm) 2.8 % Reduct Depth: (cm) 0.1 Epitheli Area: (cm) 5.718 Tunneli Volume: (cm) 0.572 Undermi Wound Description Classification: Full Thickness Without Exposed Support Foul Odo Structures Slough/F Wound Distinct, outline attached Margin: Exudate Medium Amount: Exudate Serosanguineous Type: Exudate red, brown Color: Wound Bed Granulation Amount: Medium (34-66%) Granulation Quality: Pink Fascia E Necrotic Amount: Medium (34-66%) Fat Laye Necrotic Quality: Adherent Slough Tendon E Muscle E Joint Ex Bone Exp r After Cleansing: No ibrino Yes Exposed Structure xposed: No r (Subcutaneous Tissue) Exposed: Yes xposed: No xposed: No posed: No osed: No ion in Area: 38.5% ion in Volume: 38.5% alization: Small (1-33%) ng: No ning: No Treatment Notes Wound #4 (Right, Distal, Lateral Lower Leg) 1. Cleanse With Soap and water 2. Periwound Care Moisturizing lotion TCA Ointment 3. Primary Dressing Applied Iodoflex 4. Secondary Dressing ABD Pad 6. Support Layer Applied 3 layer compression Water quality scientist) Signed: 11/05/2019 4:54:41 PM By: Mikeal Hawthorne EMT/HBOT Signed: 11/05/2019 5:42:31 PM By: Deon Pilling Previous Signature: 11/01/2019 9:29:33 AM Version By: Levan Hurst RN, BSN Entered By: Mikeal Hawthorne on 11/05/2019  13:28:49 -------------------------------------------------------------------------------- Wound Assessment Details Patient Name: Date of Service: RAWN, QUIROA 11/01/2019 8:30 AM Medical Record [id number] Patient Account Number: [id number] Date of Birth/Sex: Treating RN: [birth year] (79 y.o. Male) Deon Pilling Primary Care Provider: Glennon Mac., Herbie [place] Other Clinician: Referring Provider: Treating Provider/Extender:Robson, Pamella Pert., ROBERT Weeks in Treatment: 3 Wound Status Wound Number: 5 Primary Trauma, Other Etiology: Wound Location: Left Metatarsal head first - Medial Wound Open Wounding Event: Not Known Status: Date Acquired: 10/11/2019 Comorbid Anemia, Congestive Heart Failure, Weeks Of Treatment: 3 History: Hypertension, Peripheral Venous Disease, Clustered Wound: No End Stage Renal Disease  Photos Wound Measurements Length: (cm) 0.8 % Reduc Width: (cm) 0.6 % Reduc Depth: (cm) 0.1 Epithel Area: (cm) 0.377 Tunnel Volume: (cm) 0.038 Underm Wound Description Full Thickness Without Exposed Support Foul Od Classification: Structures Classification: Structures Slough/ Wound Distinct, outline attached Margin: Exudate Small Amount: Exudate Serosanguineous Type: Exudate red, brown Color: Wound Bed Granulation Amount: None Present (0%) Necrotic Amount: Large (67-100%) Fascia E Necrotic Quality: Eschar Fat Laye Tendon E Muscle E Joint Ex Bone Exp or After Cleansing: No Fibrino Yes Exposed Structure xposed: No r (Subcutaneous Tissue) Exposed: Yes xposed: No xposed: No posed: No osed: No tion in Area: 0% tion in Volume: 49.3% ialization: None ing: No ining: No Treatment Notes Wound #5 (Left, Medial Metatarsal head first) 1. Cleanse With Soap and water 2. Periwound Care Moisturizing lotion TCA Ointment 3. Primary Dressing Applied Iodoflex 4. Secondary Dressing ABD Pad 6. Support Layer Applied 3 layer compression  wrap Electronic Signature(s) Signed: 11/05/2019 4:54:41 PM By: Mikeal Hawthorne EMT/HBOT Signed: 11/05/2019 5:42:31 PM By: Deon Pilling Previous Signature: 11/01/2019 9:29:33 AM Version By: Levan Hurst RN, BSN Entered By: Mikeal Hawthorne on 11/05/2019 13:29:12 -------------------------------------------------------------------------------- Vitals Details Patient Name: Date of Service: CADDEN, ELIZONDO 11/01/2019 8:30 AM Medical Record [id number] Patient Account Number: [id number] Date of Birth/Sex: Treating RN: Nov 20, 1940 (79 y.o. Male) Levan Hurst Primary Care Provider: Glennon Mac., ROBERT Other Clinician: Referring Provider: Treating Provider/Extender:Robson, Pamella Pert., ROBERT Weeks in Treatment: 3 Vital Signs Time Taken: 08:14 Temperature (F): 97.5 Height (in): 71 Pulse (bpm): 76 Weight (lbs): 198 Respiratory Rate (breaths/min): 22 Body Mass Index (BMI): 27.6 Blood Pressure (mmHg): 121/75 Reference Range: 80 - 120 mg / dl Electronic Signature(s) Signed: 11/01/2019 9:29:33 AM By: Levan Hurst RN, BSN Entered By: Levan Hurst on 11/01/2019 09:13:07

## 2019-11-08 ENCOUNTER — Encounter (HOSPITAL_BASED_OUTPATIENT_CLINIC_OR_DEPARTMENT_OTHER): Payer: Medicare Other | Admitting: Internal Medicine

## 2019-11-08 ENCOUNTER — Other Ambulatory Visit: Payer: Self-pay

## 2019-11-08 DIAGNOSIS — I87312 Chronic venous hypertension (idiopathic) with ulcer of left lower extremity: Secondary | ICD-10-CM | POA: Diagnosis not present

## 2019-11-10 NOTE — Progress Notes (Signed)
[name], [name] ([id number]) Visit Report for 11/08/2019 Debridement Details Patient Name: Date of Service: [name], [name] 11/08/2019 8:30 AM Medical Record [id number] Patient Account Number: [id number] Date of Birth/Sex: Treating RN: [birth year] (79 y.o. [name] Primary Care Provider: Glennon Mac., Patton Other Clinician: Referring Provider: Treating Provider/Extender:Troy Cope, Pamella Pert., Patton Weeks in Treatment: 4 Debridement Performed for Wound #1 Left,Lateral Lower Leg Assessment: Performed By: Physician [name] Debridement Type: Debridement Severity of Tissue Pre Fat layer exposed Debridement: Level of Consciousness (Pre- Awake and Alert procedure): Pre-procedure Verification/Time Out Taken: Yes - 09:00 Start Time: 09:01 Pain Control: Lidocaine 4% Topical Solution Total Area Debrided (L x W): 3 (cm) x 3 (cm) = 9 (cm) Tissue and other material Viable, Non-Viable, Slough, Subcutaneous, Skin: Dermis , Fibrin/Exudate, Slough debrided: Level: Skin/Subcutaneous Tissue Debridement Description: Excisional Instrument: Curette Bleeding: Moderate Hemostasis Achieved: Pressure End Time: 09:06 Procedural Pain: 0 Post Procedural Pain: 0 Response to Treatment: Procedure was tolerated well Level of Consciousness Awake and Alert (Post-procedure): Post Debridement Measurements of Total Wound Length: (cm) 5.2 Width: (cm) 4.5 Depth: (cm) 0.1 Volume: (cm) 1.838 Character of Wound/Ulcer Post Requires Further Debridement Debridement: Severity of Tissue Post Debridement: Fat layer exposed Post Procedure Diagnosis Same as Pre-procedure Electronic Signature(s) Signed: 11/08/2019 6:00:33 PM By: [name] Signed: 11/10/2019 7:03:15 AM By: [name] [name] on 11/08/2019 09:10:38 -------------------------------------------------------------------------------- Debridement Details Patient Name: Date of  Service: [name], [name] 11/08/2019 8:30 AM Medical Record [id number] Patient Account Number: [id number] Date of Birth/Sex: Treating RN: 25-Nov-1940 (79 y.o. [name] Primary Care Provider: Glennon Mac., Patton Other Clinician: Referring Provider: Treating Provider/Extender:Troy Ortez, Pamella Pert., Patton Weeks in Treatment: 4 Debridement Performed for Wound #3 Right,Proximal,Lateral Lower Leg Assessment: Performed By: Physician [name] Debridement Type: Debridement Severity of Tissue Pre Fat layer exposed Debridement: Level of Consciousness (Pre- Awake and Alert procedure): Pre-procedure Yes - 09:00 Verification/Time Out Taken: Start Time: 09:01 Pain Control: Lidocaine 4% Topical Solution Total Area Debrided (L x W): 2.9 (cm) x 2 (cm) = 5.8 (cm) Tissue and other material Viable, Non-Viable, Slough, Subcutaneous, Skin: Dermis , Fibrin/Exudate, Slough debrided: Level: Skin/Subcutaneous Tissue Debridement Description: Excisional Instrument: Curette Bleeding: Moderate Hemostasis Achieved: Pressure End Time: 09:06 Procedural Pain: 0 Post Procedural Pain: 0 Response to Treatment: Procedure was tolerated well Level of Consciousness Awake and Alert (Post-procedure): Post Debridement Measurements of Total Wound Length: (cm) 2.9 Width: (cm) 2 Depth: (cm) 0.2 Volume: (cm) 0.911 Character of Wound/Ulcer Post Requires Further Debridement Debridement: Severity of Tissue Post Debridement: Fat layer exposed Post Procedure Diagnosis Same as Pre-procedure Electronic Signature(s) Signed: 11/08/2019 6:00:33 PM By: [name] Signed: 11/10/2019 7:03:15 AM By: [name] [name] on 11/08/2019 09:10:55 -------------------------------------------------------------------------------- Debridement Details Patient Name: Date of Service: [name], [name] 11/08/2019 8:30 AM Medical Record [id number] Patient Account  Number: [id number] Date of Birth/Sex: Treating RN: 02-Mar-1941 (79 y.o. [name] Primary Care Provider: Glennon Mac., Patton Other Clinician: Referring Provider: Treating Provider/Extender:Troy Tejada, Pamella Pert., Patton Weeks in Treatment: 4 Debridement Performed for Wound #4 Right,Distal,Lateral Lower Leg Assessment: Performed By: Physician [name] Debridement Type: Debridement Severity of Tissue Pre Fat layer exposed Debridement: Level of Consciousness (Pre- Awake and Alert procedure): Pre-procedure Verification/Time Out Taken: Yes - 09:00 Start Time: 09:01 Pain Control: Lidocaine 4% Topical Solution Total Area Debrided (L x W): 2.5 (cm) x 3 (cm) = 7.5 (cm) Tissue and other material Viable, Non-Viable, Slough, Subcutaneous, Skin: Dermis , Fibrin/Exudate, Slough debrided:  Level: Skin/Subcutaneous Tissue Debridement Description: Excisional Instrument: Curette Bleeding: Moderate Hemostasis Achieved: Pressure End Time: 09:06 Procedural Pain: 0 Post Procedural Pain: 0 Response to Treatment: Procedure was tolerated well Level of Consciousness Awake and Alert (Post-procedure): Post Debridement Measurements of Total Wound Length: (cm) 2.5 Width: (cm) 3 Depth: (cm) 0.1 Volume: (cm) 0.589 Character of Wound/Ulcer Post Requires Further Debridement Debridement: Severity of Tissue Post Debridement: Fat layer exposed Post Procedure Diagnosis Same as Pre-procedure Electronic Signature(s) Signed: 11/08/2019 6:00:33 PM By: [name] Signed: 11/10/2019 7:03:15 AM By: [name] [name] on 11/08/2019 09:11:03 -------------------------------------------------------------------------------- HPI Details Patient Name: Date of Service: [name], [name] 11/08/2019 8:30 AM Medical Record [id number] Patient Account Number: [id number] Date of Birth/Sex: Treating RN: February 20, 1941 (79 y.o. [name] Primary Care  Provider: Glennon Mac., Patton Other Clinician: Referring Provider: Treating Provider/Extender:Ivelis Norgard, Pamella Pert., Patton Weeks in Treatment: 4 History of Present Illness HPI Description: ADMISSION 10/11/2019 This is a 79 year old man with a history of a severe cardiomyopathy with an ejection fraction of about 20%, chronic renal failure stage III. He is listed as a type II diabetic in epic although the patient denies this. He also has a history of PVD. He states for the last month he has had wounds on his bilateral lower extremities that started off as blisters which denuded. He has areas on the left lateral calf and 2 on the right lateral. He has an area on the left first met head which he did not know was there he we identified this on intake. He has been using Silvadene cream provided by his primary care physician but he is complaining that this burns. 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 He was acutely ill at that time complicating GI bleed. ABIs in our clinic were 1.16 on the right and 1.13 on the left 2/25; the patient comes in with his areas on the left lateral and right lateral calf. There is also an area over the left first MTP bunion deformity. We have been using Sorbact. His edema control is fairly good 3/4; left lateral and right lateral calf. Most of his wounds are in the same position tightly adherent nonviable debris. On the right we debrided the superior wound on the left both wounds. The area on his bunion over the left first MTP medially is I think just about closed. We have been using Sorbact without a lot of  success changed to Iodoflex under compression 3/11; left lateral and right lateral calf perhaps minor improvement in the surface condition. We have been using Iodoflex. The area over the bunion of the left first MTP has closed over Electronic Signature(s) Signed: 11/10/2019 7:03:15 AM By: [name] [name] on 11/08/2019 09:12:18 -------------------------------------------------------------------------------- Physical Exam Details Patient Name: Date of Service: [name], [name] 11/08/2019 8:30 AM Medical Record [id number] Patient Account Number: [id number] Date of Birth/Sex: Treating RN: 07-12-1941 (79 y.o. [name] Primary Care Provider: Glennon Mac., Patton Other Clinician: Referring Provider: Treating Provider/Extender:Dquan Cortopassi, Pamella Pert., Patton Weeks in Treatment: 4 Constitutional Sitting or standing Blood Pressure is within target range for patient.. Pulse regular and within target range for patient.Marland Kitchen Respirations regular, non-labored  and within target range.. Temperature is normal and within the target range for the patient.Marland Kitchen Appears in no distress. Cardiovascular It will pulses are palpable. Notes Wound exam; the patient has essentially mirror-image areas on the right lateral and left lateral 2 wounds on each side I think on the left we have coalesced into 1 wound. All of them covered and very fibrinous adherent material exceptionally difficult to debride. Using a #5 curette I attempted this as much as the patient could stand however even with intense effort today we did not I think get down to what I would want to see is a viable working area. There is no evidence of infection. There is some weeping edema which is going to leave me to go from a 3-4 layer compression to see if we can help in this area Electronic Signature(s) Signed: 11/10/2019 7:03:15 AM By: [name] [name] on 11/08/2019  09:14:24 -------------------------------------------------------------------------------- Physician Orders Details Patient Name: Date of Service: [name], [name] 11/08/2019 8:30 AM Medical Record [id number] Patient Account Number: [id number] Date of Birth/Sex: Treating RN: 08-03-1941 (79 y.o. 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 - Thursday Dressing Change Frequency Other: - change twice a week by home health. wound Center to change weekly. Skin Barriers/Peri-Wound Care Moisturizing lotion TCA Cream or Ointment - liberally in clinic today mixed with lotion. Wound Cleansing May shower with protection. - use cast protectors on the days dressings are not changed. May shower and wash wound with soap and water. - with dressing changes only. Primary Wound Dressing Wound #1 Left,Lateral Lower Leg Iodoflex - if home health does not have iodoflex may use iodosorb ointment. Wound #3 Right,Proximal,Lateral Lower Leg Iodoflex - if home health does not have iodoflex may use iodosorb ointment. Wound #4 Right,Distal,Lateral Lower Leg Iodoflex - if home health does not have iodoflex may use iodosorb ointment. Secondary Dressing ABD pad Other: - pad left medial first met head for protection. Edema Control 4 layer compression -  Bilateral - ensure to wrap from foot to just below calf. Avoid standing for long periods of time Elevate legs to the level of the heart or above for 30 minutes daily and/or when sitting, a frequency of: - throughout the day. [place] [facility] skilled nursing for wound care. [facility] Electronic Signature(s) Signed: 11/08/2019 6:00:33 PM By: [name] Signed: 11/10/2019 7:03:15 AM By: [name] Troy By: [name] on 11/08/2019 09:09:58 -------------------------------------------------------------------------------- Problem List Details Patient Name: Date of Service: [name], [name] 11/08/2019 8:30 AM Medical Record [id number] Patient Account Number: [id number] Date of Birth/Sex: Treating RN: 28-Apr-1941 (79 y.o. [name] Primary Care Provider: Glennon Mac., Patton Other Clinician: Referring Provider: Treating Provider/Extender:Lilliann Rossetti, Pamella Pert., Tacy Learn in Treatment: 4 Active Problems ICD-10 Evaluated Encounter Code Description Active Date Today Diagnosis I87.323 Chronic venous hypertension (idiopathic) with 10/11/2019 No Yes inflammation of bilateral lower extremity I89.0 Lymphedema, not elsewhere classified 10/11/2019 No Yes L97.822 Non-pressure chronic  ulcer of other part of left lower 10/11/2019 No Yes leg with fat layer exposed L97.112 Non-pressure chronic ulcer of right thigh with fat layer 10/11/2019 No Yes exposed L97.521 Non-pressure chronic ulcer of other part of left foot 10/11/2019 No Yes limited to breakdown of skin Inactive Problems Resolved Problems Electronic Signature(s) Signed: 11/10/2019 7:03:15 AM By: [name] [name] on 11/08/2019 09:10:16 -------------------------------------------------------------------------------- Progress Note Details Patient Name: Date of Service: [name], [name] 11/08/2019 8:30 AM Medical Record [id number] Patient Account  Number: [id number] Date of Birth/Sex: Treating RN: 08-06-1941 (79 y.o. [name] Primary Care Provider: Glennon Mac., Patton Other Clinician: Referring Provider: Treating Provider/Extender:Cheyenna Pankowski, Pamella Pert., Patton Weeks in Treatment: 4 Subjective History of Present Illness (HPI) ADMISSION 10/11/2019 This is a 79 year old man with a history of a severe cardiomyopathy with an ejection fraction of about 20%, chronic renal failure stage III. He is listed as a type II diabetic in epic although the patient denies this. He also has a history of PVD. He states for the last month he has had wounds on his bilateral lower extremities that started off as blisters which denuded. He has areas on the left lateral calf and 2 on the right lateral. He has an area on the left first met head which he did not know was there he we identified this on intake. He has been using Silvadene cream provided by his primary care physician but he is complaining that this burns. 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 He was acutely ill at that time complicating GI bleed. ABIs in our clinic were 1.16 on the right and 1.13 on the left 2/25; the patient comes in with his areas on the left lateral and right lateral calf. There is also an area over the left first MTP bunion deformity. We have been using Sorbact. His edema control is fairly good 3/4; left lateral and right lateral calf. Most of his wounds are in the same position tightly adherent nonviable debris. On the right we debrided the superior wound on the left both wounds. The area on his bunion over  the left first MTP medially is I think just about closed. We have been using Sorbact without a lot of success changed to Iodoflex under compression 3/11; left lateral and right lateral calf perhaps minor improvement in the surface condition. We have been using Iodoflex. The area over the bunion of the left first MTP has closed over Objective Constitutional Sitting or standing Blood Pressure is within target range for patient.. Pulse regular and within target range for patient.Marland Kitchen Respirations regular, non-labored and within target range.. Temperature is normal and within the target range for the patient.Marland Kitchen Appears in no distress. Vitals Time Taken: 8:16 AM, Height: 71 in, Weight: 198 lbs, BMI: 27.6, Temperature: 97.6 F, Pulse: 90 bpm, Respiratory Rate: 22 breaths/min, Blood Pressure: 134/70 mmHg. Cardiovascular It will pulses are palpable. General Notes: Wound exam; the patient has essentially mirror-image areas on the right lateral and left lateral 2 wounds on each side I think on the left we have coalesced into 1 wound. All of them covered and very fibrinous adherent material exceptionally difficult to debride. Using a #  5 curette I attempted this as much as the patient could stand however even with intense effort today we did not I think get down to what I would want to see is a viable working area. There is no evidence of infection. There is some weeping edema which is going to leave me to go from a 3-4 layer compression to see if we can help in this area Integumentary (Hair, Skin) Wound #1 status is Open. Original cause of wound was Blister. The wound is located on the Left,Lateral Lower Leg. The wound measures 5.2cm length x 4.5cm width x 0.1cm depth; 18.378cm^2 area and 1.838cm^3 volume. There is Fat Layer (Subcutaneous Tissue) Exposed exposed. There is no tunneling or undermining noted. There is a medium amount of serosanguineous drainage noted. The wound margin is distinct with the  outline attached to the wound base. There is small (1-33%) pink granulation within the wound bed. There is a large (67-100%) amount of necrotic tissue within the wound bed including Adherent Slough. Wound #3 status is Open. Original cause of wound was Blister. The wound is located on the Right,Proximal,Lateral Lower Leg. The wound measures 2.9cm length x 2cm width x 0.2cm depth; 4.555cm^2 area and 0.911cm^3 volume. There is Fat Layer (Subcutaneous Tissue) Exposed exposed. There is no tunneling or undermining noted. There is a medium amount of serosanguineous drainage noted. The wound margin is distinct with the outline attached to the wound base. There is small (1-33%) pink granulation within the wound bed. There is a large (67-100%) amount of necrotic tissue within the wound bed including Adherent Slough. Wound #4 status is Open. Original cause of wound was Blister. The wound is located on the Right,Distal,Lateral Lower Leg. The wound measures 2.5cm length x 3cm width x 0.1cm depth; 5.89cm^2 area and 0.589cm^3 volume. There is Fat Layer (Subcutaneous Tissue) Exposed exposed. There is no tunneling or undermining noted. There is a medium amount of serosanguineous drainage noted. The wound margin is distinct with the outline attached to the wound base. There is small (1-33%) pink granulation within the wound bed. There is a large (67-100%) amount of necrotic tissue within the wound bed including Adherent Slough. Wound #5 status is Healed - Epithelialized. Original cause of wound was Not Known. The wound is located on the Left,Medial Metatarsal head first. The wound measures 0cm length x 0cm width x 0cm depth; 0cm^2 area and 0cm^3 volume. There is Fat Layer (Subcutaneous Tissue) Exposed exposed. There is no tunneling or undermining noted. There is a small amount of serosanguineous drainage noted. The wound margin is distinct with the outline attached to the wound base. There is no granulation within  the wound bed. There is a large (67-100%) amount of necrotic tissue within the wound bed including Eschar. 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 There was a Excisional Skin/Subcutaneous Tissue Debridement with a total area of 9 sq cm performed by [name]. With the following instrument(s): Curette to remove Viable and Non-Viable tissue/material. Material removed includes Subcutaneous Tissue, Slough, Skin: Dermis, and Fibrin/Exudate after achieving  pain control using Lidocaine 4% Topical Solution. A time out was conducted at 09:00, prior to the start of the procedure. A Moderate amount of bleeding was controlled with Pressure. The procedure was tolerated well with a pain level of 0 throughout and a pain level of 0 following the procedure. Post Debridement Measurements: 5.2cm length x 4.5cm width x 0.1cm depth; 1.838cm^3 volume. Character of Wound/Ulcer Post Debridement requires further debridement. Severity of Tissue Post Debridement is: Fat layer exposed. Post procedure Diagnosis Wound #1: Same as Pre-Procedure Pre-procedure diagnosis of Wound #1 is a Venous Leg Ulcer located on the Left,Lateral Lower Leg . There was a Four Layer Compression Therapy Procedure with a pre-treatment ABI of 1.1 by Baruch Gouty, RN. Post procedure Diagnosis Wound #1: Same as Pre-Procedure Wound #3 Pre-procedure diagnosis of Wound #3 is a Venous Leg Ulcer located on the  Right,Proximal,Lateral Lower Leg .Severity of Tissue Pre Debridement is: Fat layer exposed. There was a Excisional Skin/Subcutaneous Tissue Debridement with a total area of 5.8 sq cm performed by [name]. With the following instrument(s): Curette to remove Viable and Non-Viable tissue/material. Material removed includes Subcutaneous Tissue, Slough, Skin: Dermis, and Fibrin/Exudate after achieving pain control using Lidocaine 4% Topical Solution. A time out was conducted at 09:00, prior to the start of the procedure. A Moderate amount of bleeding was controlled with Pressure. The procedure was tolerated well with a pain level of 0 throughout and a pain level of 0 following the procedure. Post Debridement Measurements: 2.9cm length x 2cm width x 0.2cm depth; 0.911cm^3 volume. Character of Wound/Ulcer Post Debridement requires further debridement. Severity of Tissue Post Debridement is: Fat layer exposed. 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 There was a Four Layer Compression Therapy Procedure with a pre-treatment ABI of 1.1 by Baruch Gouty, RN. Post procedure Diagnosis Wound #3: Same as Pre-Procedure Wound #4 Pre-procedure diagnosis of Wound #4 is a Venous Leg Ulcer located on the Right,Distal,Lateral Lower Leg .Severity of Tissue Pre Debridement is: Fat layer exposed. There was a Excisional Skin/Subcutaneous Tissue Debridement with a total area of 7.5 sq cm performed by [name]. With the following instrument(s): Curette to remove Viable and Non-Viable tissue/material. Material removed includes Subcutaneous Tissue, Slough, Skin: Dermis, and Fibrin/Exudate after achieving pain control using Lidocaine 4% Topical Solution. A time out was conducted at 09:00, prior to the start of the procedure. A Moderate amount of bleeding was controlled  with Pressure. The procedure was tolerated well with a pain level of 0 throughout and a pain level of 0 following the procedure. Post Debridement Measurements: 2.5cm length x 3cm width x 0.1cm depth; 0.589cm^3 volume. Character of Wound/Ulcer Post Debridement requires further debridement. Severity of Tissue Post Debridement is: Fat layer exposed. 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 There was a Four Layer Compression Therapy Procedure with a pre-treatment ABI of 1.1 by Baruch Gouty, RN. Post procedure Diagnosis Wound #4: Same as Pre-Procedure Plan Follow-up Appointments: Return Appointment in 1 week. - Thursday Dressing Change Frequency: Other: - change twice a week by home health. wound Center to change weekly. Skin Barriers/Peri-Wound Care: Moisturizing lotion TCA Cream or Ointment - liberally in clinic today mixed with lotion. Wound Cleansing: May shower with protection. - use cast protectors on the days dressings are not  changed. May shower and wash wound with soap and water. - with dressing changes only. Primary Wound Dressing: Wound #1 Left,Lateral Lower Leg: Iodoflex - if home health does not have iodoflex may use iodosorb ointment. Wound #3 Right,Proximal,Lateral Lower Leg: Iodoflex - if home health does not have iodoflex may use iodosorb ointment. Wound #4 Right,Distal,Lateral Lower Leg: Iodoflex - if home health does not have iodoflex may use iodosorb ointment. Secondary Dressing: ABD pad Other: - pad left medial first met head for protection. Edema Control: 4 layer compression - Bilateral - ensure to wrap from foot to just below calf. Avoid standing for long periods of time Elevate legs to the level of the heart or above for 30 minutes daily and/or when sitting, a frequency of: - throughout the day. Home Health: [facility] for wound care. - [facility] 1. Iodoflex to continue to both wound areas on the lateral calf 2. I have increased this compression from 3-4 layer to see if we can account for weeping edema Electronic Signature(s) Signed: 11/10/2019 7:03:15 AM By: [name] [name] on 11/08/2019 09:15:09 -------------------------------------------------------------------------------- SuperBill Details Patient Name: Date of Service: [name], [name] 11/08/2019 Medical Record [id number] Patient Account Number: [id number] Date of Birth/Sex: Treating RN: 1940/12/26 (79 y.o. 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 Facility Procedures The patient participates with Medicare or their insurance follows the Medicare Facility Guidelines: CPT4 Code Description Modifier Quantity [id number] 11042 - DEB SUBQ TISSUE 20 SQ CM/< 1 ICD-10 Diagnosis Description L97.822 Non-pressure chronic ulcer of  other part of left lower leg with fat layer exposed L97.521 Non-pressure chronic ulcer of other part of left foot limited to breakdown of skin The patient participates with Medicare or their insurance follows the Medicare Facility Guidelines: [id number] 11045 - DEB SUBQ TISS EA ADDL 20CM 1 ICD-10 Diagnosis Description V74.827 Non-pressure chronic ulcer of right thigh with fat layer exposed  L97.822 Non-pressure  chronic ulcer of other part of left lower leg with fat layer exposed Physician Procedures CPT4 Code Description: [id number] 49201 - WC PHYS SUBQ TISS 20 SQ CM ICD-10 Diagnosis Description L97.822 Non-pressure chronic ulcer of other part of left lower leg L97.521 Non-pressure chronic ulcer of other part of left foot limit [id number] 11045 - WC PHYS  SUBQ TISS EA ADDL 20 CM ICD-10 Diagnosis Description L97.112 Non-pressure chronic ulcer of right thigh with fat layer expo L97.822 Non-pressure chronic ulcer of other part of left lower leg wi Modifier: with fat layer exp ed to breakdown of 1 sed th fat layer expos Quantity: 1 osed skin ed Electronic Signature(s) Signed: 11/10/2019 7:03:15 AM By: [name] [name] on 11/08/2019 09:15:30

## 2019-11-15 ENCOUNTER — Encounter (HOSPITAL_BASED_OUTPATIENT_CLINIC_OR_DEPARTMENT_OTHER): Payer: Medicare Other | Admitting: Internal Medicine

## 2019-11-15 ENCOUNTER — Other Ambulatory Visit: Payer: Self-pay

## 2019-11-15 DIAGNOSIS — I87312 Chronic venous hypertension (idiopathic) with ulcer of left lower extremity: Secondary | ICD-10-CM | POA: Diagnosis not present

## 2019-11-15 NOTE — Progress Notes (Signed)
Troy, Patton ([id number]) Visit Report for 11/15/2019 Arrival Information Details Patient Name: Date of Service: [name], [name] 11/15/2019 8:30 AM Medical Record [id number] Patient Account Number: [id number] Date of Birth/Sex: Treating RN: [birth year] (79 y.o. [name] Primary Care Yazmyn Valbuena: Glennon Mac., ROBERT Other Clinician: Referring Jaia Alonge: Treating Dhyan Noah/Extender:Robson, Pamella Pert., ROBERT Weeks in Treatment: 5 Visit Information History Since Last Visit Added or deleted any medications: No Patient Arrived: [name] Any new allergies or adverse reactions: No Arrival Time: 08:27 Had a fall or experienced change in No Accompanied By: self activities of daily living that may affect Transfer Assistance: None risk of falls: Patient Identification Verified: Yes Signs or symptoms of abuse/neglect since last No Secondary Verification Process Yes visito Completed: Hospitalized since last visit: No Patient Requires Transmission- No Implantable device outside of the clinic excluding No Based Precautions: cellular tissue based products placed in the center Patient Has Alerts: Yes since last visit: Patient Alerts: Patient on Blood Has Dressing in Place as Prescribed: Yes Thinner Has Compression in Place as Prescribed: Yes Pain Present Now: Yes Electronic Signature(s) Signed: 11/15/2019 4:36:06 PM By: Kela Millin Entered By: Kela Millin on 11/15/2019 08:28:11 -------------------------------------------------------------------------------- Compression Therapy Details Patient Name: Date of Service: ULES, MARSALA 11/15/2019 8:30 AM Medical Record [id number] Patient Account Number: [id number] Date of Birth/Sex: Treating RN: 08-08-1941 (79 y.o. [name] Primary Care Annick Dimaio: Glennon Mac., ROBERT Other Clinician: Referring Chantele Corado: Treating Waldine Zenz/Extender:Robson, Pamella Pert., ROBERT Weeks in Treatment:  5 Compression Therapy Performed for Wound Wound #1 Left,Lateral Lower Leg Assessment: Performed By: Clinician Baruch Gouty, RN Compression Type: Four Layer Pre Treatment ABI: 1.1 Post Procedure Diagnosis Same as Pre-procedure Electronic Signature(s) Signed: 11/15/2019 4:55:35 PM By: Deon Pilling Entered By: Deon Pilling on 11/15/2019 09:12:24 -------------------------------------------------------------------------------- Compression Therapy Details Patient Name: Date of Service: [name], [name] 11/15/2019 8:30 AM Medical Record [id number] Patient Account Number: [id number] Date of Birth/Sex: Treating RN: [birth year] (79 y.o. [name] Primary Care Khyan Oats: Glennon Mac., ROBERT Other Clinician: Referring Ancelmo Hunt: Treating Kayah Hecker/Extender:Robson, Pamella Pert., ROBERT Weeks in Treatment: 5 Compression Therapy Performed for Wound Wound #3 Right,Proximal,Lateral Lower Leg Assessment: Performed By: Clinician Baruch Gouty, RN Compression Type: Four Layer Pre Treatment ABI: 1.1 Post Procedure Diagnosis Same as Pre-procedure Electronic Signature(s) Signed: 11/15/2019 4:55:35 PM By: Deon Pilling Entered By: Deon Pilling on 11/15/2019 09:12:24 -------------------------------------------------------------------------------- Compression Therapy Details Patient Name: Date of Service: [name], [name] 11/15/2019 8:30 AM Medical Record [id number] Patient Account Number: [id number] Date of Birth/Sex: Treating RN: 07-09-1941 (79 y.o. [name] Primary Care Aryonna Gunnerson: Glennon Mac., ROBERT Other Clinician: Referring Harshith Pursell: Treating Leandria Thier/Extender:Robson, Pamella Pert., ROBERT Weeks in Treatment: 5 Compression Therapy Performed for Wound Wound #4 Right,Distal,Lateral Lower Leg Assessment: Performed By: Clinician Baruch Gouty, RN Compression Type: Four Layer Pre Treatment ABI: 1.1 Post Procedure Diagnosis Same as  Pre-procedure Electronic Signature(s) Signed: 11/15/2019 4:55:35 PM By: Deon Pilling Entered By: Deon Pilling on 11/15/2019 09:12:24 -------------------------------------------------------------------------------- Encounter Discharge Information Details Patient Name: Date of Service: Troy, [name] 11/15/2019 8:30 AM Medical Record [id number] Patient Account Number: [id number] Date of Birth/Sex: Treating RN: 06/04/1941 (79 y.o. [name] Primary Care Vishwa Dais: Glennon Mac., ROBERT Other Clinician: Referring Skylan Gift: Treating Amiree No/Extender:Robson, Pamella Pert., Tacy Learn in Treatment: 5 Encounter Discharge Information Items Post Procedure Vitals Discharge Condition: Stable Temperature (F): 98.8 Ambulatory Status: Cane Pulse (bpm): 81 Discharge Destination: Home Respiratory Rate (breaths/min): 18 Transportation: Other Blood Pressure (mmHg): 123/71 Accompanied By: self Schedule Follow-up Appointment: Yes Clinical Summary of Care: Patient Declined Notes SCAT Electronic Signature(s)  Signed: 11/15/2019 4:45:01 PM By: Baruch Gouty RN, BSN Entered By: Baruch Gouty on 11/15/2019 09:39:33 -------------------------------------------------------------------------------- Lower Extremity Assessment Details Patient Name: Date of Service: [name], [name] 11/15/2019 8:30 AM Medical Record [id number] Patient Account Number: [id number] Date of Birth/Sex: Treating RN: September 13, 1940 (78 y.o. [name] Primary Care Anne [place]: Glennon Mac., ROBERT Other Clinician: Referring Nikitha Mode: Treating Rashunda Passon/Extender:Robson, Pamella Pert., ROBERT Weeks in Treatment: 5 Edema Assessment Assessed: [Left: No] [Right: No] Edema: [Left: Yes] [Right: Yes] Calf Left: Right: Point of Measurement: 43 cm From Medial Instep 38.5 cm 38 cm Ankle Left: Right: Point of Measurement: 14 cm From Medial Instep 23.5 cm 23 cm Vascular Assessment Pulses: Dorsalis  Pedis Palpable: [Left:No] [Right:No] Electronic Signature(s) Signed: 11/15/2019 4:36:06 PM By: Kela Millin Entered By: Kela Millin on 11/15/2019 08:35:41 -------------------------------------------------------------------------------- Multi Wound Chart Details Patient Name: Date of Service: KILEY, TORRENCE 11/15/2019 8:30 AM Medical Record [id number] Patient Account Number: [id number] Date of Birth/Sex: Treating RN: [birth year] (79 y.o. [name] Primary Care Brocha Gilliam: Glennon Mac., ROBERT Other Clinician: Referring Osher Oettinger: Treating Kaelei Wheeler/Extender:Robson, Pamella Pert., ROBERT Weeks in Treatment: 5 Vital Signs Height(in): 71 Pulse(bpm): 91 Weight(lbs): 198 Blood Pressure(mmHg): 123/71 Body Mass Index(BMI): 28 Temperature(F): 98.8 Respiratory 21 Rate(breaths/min): Photos: [1:No Photos] [3:No Photos] [4:No Photos] Wound Location: [1:Left Lower Leg - Lateral] [3:Right Lower Leg - Lateral, Right Lower Leg - Lateral, Proximal] [4:Distal] Wounding Event: [1:Blister] [3:Blister] [4:Blister] Primary Etiology: [1:Venous Leg Ulcer] [3:Venous Leg Ulcer] [4:Venous Leg Ulcer] Comorbid History: [1:Anemia, Congestive Heart Anemia, Congestive Heart Anemia, Congestive Heart Failure, Hypertension, Peripheral Venous Disease, Peripheral Venous Disease, Peripheral Venous Disease, 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 End  Stage Renal Disease] [3:Failure, Hypertension,] [4:Failure, Hypertension,] Date Acquired: [1:09/11/2019] [3:09/11/2019] [4:09/11/2019] Weeks of Treatment: [1:5] [3:5] [4:5] Wound Status: [1:Open] [3:Open] [4:Open] Clustered Wound: [1:Yes] [3:No] [4:No] Clustered Quantity: [1:4] [3:N/A] [4:N/A] Measurements L x W x D 7.3x4.5x0.2 [3:2.8x2x0.1] [4:2.8x2.7x0.1] (cm) Area (cm) : [1:25.8] [3:4.398] [4:5.938] Volume (cm) : [1:5.16] [3:0.44] [4:0.594] % Reduction in Area: [1:56.20%] [3:11.10%] [4:36.10%] % Reduction in Volume: [1:12.50%]  [3:11.10%] [4:36.10%] Classification: [1:Full Thickness Without Exposed Support Structures Exposed Support Structures Exposed Support Structures] [3:Full Thickness Without] [4:Full Thickness Without] Exudate Amount: [1:Medium] [3:Medium] [4:Medium] Exudate Type: [1:Serosanguineous] [3:Serosanguineous] [4:Serosanguineous] Exudate Color: [1:red, brown] [3:red, brown] [4:red, brown] Wound Margin: [1:Distinct, outline attached Distinct, outline attached Distinct, outline attached] Granulation Amount: [1:Small (1-33%)] [3:Small (1-33%)] [4:Medium (34-66%)] Granulation Quality: [1:Pink] [3:Pink] [4:Pink] Necrotic Amount: [1:Large (67-100%)] [3:Large (67-100%)] [4:Medium (34-66%)] Exposed Structures: [1:Fat Layer (Subcutaneous Fat Layer (Subcutaneous Fat Layer (Subcutaneous Tissue) Exposed: Yes Fascia: No Tendon: No Muscle: No Joint: No Bone: No] [3:Tissue) Exposed: Yes Fascia: No Tendon: No Muscle: No Joint: No Bone: No] [4:Tissue) Exposed: Yes  Fascia: No Tendon: No Muscle: No Joint: No Bone: No] Epithelialization: [1:Small (1-33%)] [3:Small (1-33%)] [4:Small (1-33%)] Debridement: [1:Debridement - Excisional Debridement - Excisional Debridement - Excisional] Pre-procedure [1:09:00] [3:09:00] [4:09:00] Verification/Time Out Taken: Pain Control: [1:Lidocaine 4% Topical Solution] [3:Lidocaine 4% Topical Solution] [4:Lidocaine 4% Topical Solution] Tissue Debrided: [1:Subcutaneous, Slough] [3:Subcutaneous, Slough] [4:Subcutaneous, Slough] Level: [1:Skin/Subcutaneous Tissue] [3:Skin/Subcutaneous Tissue] [4:Skin/Subcutaneous Tissue] Debridement Area (sq cm):25 [3:5.6] [4:7.56] Instrument: [1:Curette] [3:Curette] [4:Curette] Bleeding: [1:Minimum] [3:Minimum] [4:Minimum] Hemostasis Achieved: [1:Pressure] [3:Pressure] [4:Pressure] Procedural Pain: [1:0] [3:0] [4:0] Post Procedural Pain: [1:0] [3:0] [4:0] Debridement Treatment Procedure was tolerated [3:Procedure was tolerated] [4:Procedure was  tolerated] Response: [1:well] [3:well] [4:well] Post Debridement [1:7.3x4.5x0.2] [3:2.8x2x0.1] [4:2.8x2.7x0.1] Measurements L x W x D (cm) Post Debridement [1:5.16] [3:0.44] [4:0.594] Volume: (cm) Procedures Performed: Compression Therapy [1:Debridement] [3:Compression Therapy Debridement] [4:Compression  Therapy Debridement] Treatment Notes Electronic Signature(s) Signed: 11/15/2019 4:55:35 PM By: Deon Pilling Signed: 11/15/2019 7:02:26 PM By: Linton Ham MD Entered By: Linton Ham on 11/15/2019 09:24:49 -------------------------------------------------------------------------------- Multi-Disciplinary Care Plan Details Patient Name: Date of Service: KHAIRI, GARMAN 11/15/2019 8:30 AM Medical Record [id number] Patient Account Number: [id number] Date of Birth/Sex: Treating RN: 1941-03-30 (79 y.o. [name] Primary Care Nyzir Dubois: Glennon Mac., ROBERT Other Clinician: Referring Susana Gripp: Treating Addysin Porco/Extender:Robson, Pamella Pert., ROBERT Weeks in Treatment: 5 Active Inactive Abuse / Safety / Falls / Self Care Management Nursing Diagnoses: Potential for falls Goals: Patient will remain injury free related to falls Date Initiated: 10/11/2019 Target Resolution Date: 11/29/2019 Goal Status: Active Patient/caregiver will verbalize understanding of the importance to maintain current immunizations/vaccinations Date Initiated: 10/11/2019 Date Inactivated: 11/08/2019 Target Resolution Date: 12/06/2019 Goal Status: Met Interventions: Assess self care needs on admission and as needed Provide education on fall prevention Treatment Activities: Patient referred to home care : 10/11/2019 Notes: Nutrition Nursing Diagnoses: Potential for alteratiion in Nutrition/Potential for imbalanced nutrition Goals: Patient/caregiver agrees to and verbalizes understanding of need to obtain nutritional consultation Date Initiated: 10/11/2019 Date Inactivated: 11/01/2019 Target  Resolution Date: 11/09/2019 Goal Status: Met Patient/caregiver agrees to and verbalizes understanding of need to use nutritional supplements and/or vitamins as prescribed Date Initiated: 10/11/2019 Target Resolution Date: 11/30/2019 Goal Status: Active Interventions: Assess HgA1c results as ordered upon admission and as needed Provide education on nutrition Treatment Activities: Education provided on Nutrition : 11/08/2019 Obtain HgA1c : 10/11/2019 Patient referred to Primary Care Physician for further nutritional evaluation : 10/11/2019 Notes: Electronic Signature(s) Signed: 11/15/2019 4:55:35 PM By: Deon Pilling Entered By: Deon Pilling on 11/15/2019 08:08:53 -------------------------------------------------------------------------------- Pain Assessment Details Patient Name: Date of Service: NYREE, [name] 11/15/2019 8:30 AM Medical Record [id number] Patient Account Number: [id number] Date of Birth/Sex: Treating RN: 07/01/1941 (79 y.o. [name] Primary Care Zyaira Vejar: Glennon Mac., ROBERT Other Clinician: Referring Orilla Templeman: Treating Dahlton Hinde/Extender:Robson, Pamella Pert., ROBERT Weeks in Treatment: 5 Active Problems Location of Pain Severity and Description of Pain Patient Has Paino Yes Site Locations Pain Location: Pain in Ulcers With Dressing Change: Yes Duration of the Pain. Constant / Intermittento Constant Character of Pain Describe the Pain: Burning Pain Management and Medication Current Pain Management: Medication: Yes Rest: Yes Electronic Signature(s) Signed: 11/15/2019 4:36:06 PM By: Kela Millin Entered By: Kela Millin on 11/15/2019 08:31:01 -------------------------------------------------------------------------------- Patient/Caregiver Education Details Patient Name: Date of Service: [name] 3/18/2021andnbsp8:30 AM Medical Record [id number] Patient Account Number: [id number] Date of  Birth/Gender: Treating RN: 1941/04/11 (79 y.o. [name] Primary Care Physician: Glennon Mac., ROBERT Other Clinician: Referring Physician: Treating Physician/Extender:Robson, Pamella Pert., Tacy Learn in Treatment: 5 Education Assessment Education Provided To: Patient Education Topics Provided Nutrition: Handouts: Nutrition Methods: Explain/Verbal Responses: Reinforcements needed Electronic Signature(s) Signed: 11/15/2019 4:55:35 PM By: Deon Pilling Entered By: Deon Pilling on 11/15/2019 08:09:05 -------------------------------------------------------------------------------- Wound Assessment Details Patient Name: Date of Service: BOBY, [name] 11/15/2019 8:30 AM Medical Record [id number] Patient Account Number: [id number] Date of Birth/Sex: Treating RN: 10-26-1940 (78 y.o. [name] Primary Care Latonyia Lopata: Glennon Mac., ROBERT Other Clinician: Referring Jonatan Wilsey: Treating Darleth Eustache/Extender:Robson, Pamella Pert., ROBERT Weeks in Treatment: 5 Wound Status Wound Number: 1 Primary Venous Leg Ulcer Etiology: Wound Location: Left Lower Leg - Lateral Wound Open Wounding Event: Blister Status: Date Acquired: 09/11/2019 Comorbid Anemia, Congestive Heart Failure, Weeks Of Treatment: 5 History: Hypertension, Peripheral Venous Disease, Clustered Wound: Yes End Stage Renal Disease Wound Measurements Length: (cm) 7.3 % Reducti Width: (cm) 4.5 % Reducti  Depth: (cm) 0.2 Epithelia Clustered Quantity: 4 Tunnelin Area: (cm) 25.8 Undermi Volume: (cm) 5.16 Wound Description Classification: Full Thickness Without Exposed Support Foul Odo Structures Slough/F Wound Distinct, outline attached Margin: Exudate Medium Amount: Exudate Serosanguineous Type: Exudate red, brown Color: Wound Bed Granulation Amount: Small (1-33%) Granulation Quality: Pink Fascia E Necrotic Amount: Large (67-100%) Fat Laye Necrotic Quality: Adherent Slough Tendon  E Muscle E Joint Ex Bone Exp r After Cleansing: No ibrino Yes Exposed Structure xposed: No r (Subcutaneous Tissue) Exposed: Yes xposed: No xposed: No posed: No osed: No on in Area: 56.2% on in Volume: 12.5% lization: Small (1-33%) g: No ning: No Treatment Notes Wound #1 (Left, Lateral Lower Leg) 2. Periwound Care Barrier cream Moisturizing lotion TCA Cream 3. Primary Dressing Applied Iodoflex 4. Secondary Dressing ABD Pad Dry Gauze 6. Support Layer Applied 4 layer compression wrap Electronic Signature(s) Signed: 11/15/2019 4:36:06 PM By: Kela Millin Entered By: Kela Millin on 11/15/2019 08:41:28 -------------------------------------------------------------------------------- Wound Assessment Details Patient Name: Date of Service: ODA, LANSDOWNE 11/15/2019 8:30 AM Medical Record [id number] Patient Account Number: [id number] Date of Birth/Sex: Treating RN: 1940-10-18 (79 y.o. [name] Primary Care Navon Kotowski: Glennon Mac., ROBERT Other Clinician: Referring Madelaine Whipple: Treating Kolbie Lepkowski/Extender:Robson, Pamella Pert., ROBERT Weeks in Treatment: 5 Wound Status Wound Number: 3 Primary Venous Leg Ulcer Etiology: Wound Location: Right Lower Leg - Lateral, Proximal Wound Open Wounding Event: Blister Status: Date Acquired: 09/11/2019 Comorbid Anemia, Congestive Heart Failure, Weeks Of Treatment: 5 History: Hypertension, Peripheral Venous Disease, Clustered Wound: No End Stage Renal Disease Wound Measurements Length: (cm) 2.8 % Reduc Width: (cm) 2 % Reduc Depth: (cm) 0.1 Epithel Area: (cm) 4.398 Tunnel Volume: (cm) 0.44 Underm Wound Description Classification: Full Thickness Without Exposed Support Foul Od Structures Slough/ Wound Distinct, outline attached Margin: Exudate Medium Amount: Exudate Serosanguineous Type: Exudate red, brown Color: Wound Bed Granulation Amount: Small (1-33%) Granulation Quality: Pink  Fascia Necrotic Amount: Large (67-100%) Fat Lay Necrotic Quality: Adherent Slough Tendon Muscle Joint E Bone Ex or After Cleansing: No Fibrino Yes Exposed Structure Exposed: No er (Subcutaneous Tissue) Exposed: Yes Exposed: No Exposed: No xposed: No posed: No tion in Area: 11.1% tion in Volume: 11.1% ialization: Small (1-33%) ing: No ining: No Treatment Notes Wound #3 (Right, Proximal, Lateral Lower Leg) 2. Periwound Care Barrier cream Moisturizing lotion TCA Cream 3. Primary Dressing Applied Iodoflex 4. Secondary Dressing ABD Pad Dry Gauze 6. Support Layer Applied 4 layer compression wrap Electronic Signature(s) Signed: 11/15/2019 4:36:06 PM By: Kela Millin Entered By: Kela Millin on 11/15/2019 08:40:21 -------------------------------------------------------------------------------- Wound Assessment Details Patient Name: Date of Service: JAYLUN, FLEENER 11/15/2019 8:30 AM Medical Record [id number] Patient Account Number: [id number] Date of Birth/Sex: Treating RN: [birth year] (79 y.o. [name] Primary Care Nevea Spiewak: Glennon Mac., ROBERT Other Clinician: Referring Linzy Darling: Treating Derril Franek/Extender:Robson, Pamella Pert., ROBERT Weeks in Treatment: 5 Wound Status Wound Number: 4 Primary Venous Leg Ulcer Etiology: Wound Location: Right Lower Leg - Lateral, Distal Wound Open Wounding Event: Blister Status: Date Acquired: 09/11/2019 Comorbid Anemia, Congestive Heart Failure, Weeks Of Treatment: 5 History: Hypertension, Peripheral Venous Disease, Clustered Wound: No End Stage Renal Disease Wound Measurements Length: (cm) 2.8 % Reduct Width: (cm) 2.7 % Reduct Depth: (cm) 0.1 Epitheli Area: (cm) 5.938 Tunneli Volume: (cm) 0.594 Undermi Wound Description Classification: Full Thickness Without Exposed Support Foul Odo Structures Slough/F Wound Distinct, outline attached Margin: Exudate Medium Amount: Exudate  Serosanguineous Type: Exudate red, brown Color: Wound Bed Granulation Amount: Medium (34-66%) Granulation Quality: Pink Fascia E Necrotic Amount: Medium (34-66%)  Fat Laye Necrotic Quality: Adherent Slough Tendon E Muscle E Joint Ex Bone Exp r After Cleansing: No ibrino Yes Exposed Structure xposed: No r (Subcutaneous Tissue) Exposed: Yes xposed: No xposed: No posed: No osed: No ion in Area: 36.1% ion in Volume: 36.1% alization: Small (1-33%) ng: No ning: No Treatment Notes Wound #4 (Right, Distal, Lateral Lower Leg) 2. Periwound Care Barrier cream Moisturizing lotion TCA Cream 3. Primary Dressing Applied Iodoflex 4. Secondary Dressing ABD Pad Dry Gauze 6. Support Layer Applied 4 layer compression wrap Electronic Signature(s) Signed: 11/15/2019 4:36:06 PM By: Kela Millin Entered By: Kela Millin on 11/15/2019 08:38:20 -------------------------------------------------------------------------------- Vitals Details Patient Name: Date of Service: TRIMAINE, MASER 11/15/2019 8:30 AM Medical Record [id number] Patient Account Number: [id number] Date of Birth/Sex: Treating RN: 1941-01-08 (79 y.o. [name] Primary Care Tin Engram: Glennon Mac., ROBERT Other Clinician: Referring Ksenia Kunz: Treating Kenly Henckel/Extender:Robson, Pamella Pert., ROBERT Weeks in Treatment: 5 Vital Signs Time Taken: 08:30 Temperature (F): 98.8 Height (in): 71 Pulse (bpm): 91 Weight (lbs): 198 Respiratory Rate (breaths/min): 21 Body Mass Index (BMI): 27.6 Blood Pressure (mmHg): 123/71 Reference Range: 80 - 120 mg / dl Electronic Signature(s) Signed: 11/15/2019 4:36:06 PM By: Kela Millin Entered By: Kela Millin on 11/15/2019 08:30:53

## 2019-11-15 NOTE — Progress Notes (Signed)
TORSTEN, WENIGER ([id number]) Visit Report for 11/15/2019 Debridement Details Patient Name: Date of Service: SLOANE, JUNKIN 11/15/2019 8:30 AM Medical Record [id number] Patient Account Number: [id number] Date of Birth/Sex: Treating RN: [birth year] (79 y.o. [name] Provider: Glennon Mac., ROBERT Other Clinician: Referring Provider: Treating Provider/Extender:Rommie Dunn, Pamella Pert., ROBERT Weeks in Treatment: 5 Debridement Performed for Wound #1 Left,Lateral Lower Leg Assessment: Performed By: Physician Ricard Dillon., MD Debridement Type: Debridement Severity of Tissue Pre Fat layer exposed Debridement: Level of Consciousness (Pre- Awake and Alert procedure): Pre-procedure Verification/Time Out Taken: Yes - 09:00 Start Time: 09:01 Pain Control: Lidocaine 4% Topical Solution Total Area Debrided (L x W): 5 (cm) x 5 (cm) = 25 (cm) Tissue and other material Non-Viable, Slough, Subcutaneous, Fibrin/Exudate, Slough debrided: Level: Skin/Subcutaneous Tissue Debridement Description: Excisional Instrument: Curette Bleeding: Minimum Hemostasis Achieved: Pressure End Time: 09:08 Procedural Pain: 0 Post Procedural Pain: 0 Response to Treatment: Procedure was tolerated well Level of Consciousness Awake and Alert (Post-procedure): Post Debridement Measurements of Total Wound Length: (cm) 7.3 Width: (cm) 4.5 Depth: (cm) 0.2 Volume: (cm) 5.16 Character of Wound/Ulcer Post Requires Further Debridement Debridement: Severity of Tissue Post Debridement: Fat layer exposed Post Procedure Diagnosis Same as Pre-procedure Electronic Signature(s) Signed: 11/15/2019 4:55:35 PM By: Deon Pilling Signed: 11/15/2019 7:02:26 PM By: Linton Ham MD Entered By: Linton Ham on 11/15/2019 09:25:04 -------------------------------------------------------------------------------- Debridement Details Patient Name: Date of Service: ABISHAI, VIEGAS  11/15/2019 8:30 AM Medical Record [id number] Patient Account Number: [id number] Date of Birth/Sex: Treating RN: [birth year] (79 y.o. [name] Provider: Glennon Mac., ROBERT Other Clinician: Referring Provider: Treating Provider/Extender:Kosisochukwu Goldberg, Pamella Pert., ROBERT Weeks in Treatment: 5 Debridement Performed for Wound #3 Right,Proximal,Lateral Lower Leg Assessment: Performed By: Physician Ricard Dillon., MD Debridement Type: Debridement Severity of Tissue Pre Fat layer exposed Debridement: Level of Consciousness (Pre- Awake and Alert procedure): Pre-procedure Yes - 09:00 Verification/Time Out Taken: Start Time: 09:01 Pain Control: Lidocaine 4% Topical Solution Total Area Debrided (L x W): 2.8 (cm) x 2 (cm) = 5.6 (cm) Tissue and other material Non-Viable, Slough, Subcutaneous, Fibrin/Exudate, Slough debrided: Level: Skin/Subcutaneous Tissue Debridement Description: Excisional Instrument: Curette Bleeding: Minimum Hemostasis Achieved: Pressure End Time: 09:08 Procedural Pain: 0 Post Procedural Pain: 0 Response to Treatment: Procedure was tolerated well Level of Consciousness Awake and Alert (Post-procedure): Post Debridement Measurements of Total Wound Length: (cm) 2.8 Width: (cm) 2 Depth: (cm) 0.1 Volume: (cm) 0.44 Character of Wound/Ulcer Post Requires Further Debridement Debridement: Severity of Tissue Post Debridement: Fat layer exposed Post Procedure Diagnosis Same as Pre-procedure Electronic Signature(s) Signed: 11/15/2019 4:55:35 PM By: Deon Pilling Signed: 11/15/2019 7:02:26 PM By: Linton Ham MD Entered By: Linton Ham on 11/15/2019 09:25:14 -------------------------------------------------------------------------------- Debridement Details Patient Name: Date of Service: BLESS, BELSHE 11/15/2019 8:30 AM Medical Record [id number] Patient Account Number: [id number] Date of Birth/Sex: Treating  RN: 1941/03/24 (79 y.o. [name] Provider: Glennon Mac., ROBERT Other Clinician: Referring Provider: Treating Provider/Extender:Ezrie Bunyan, Pamella Pert., ROBERT Weeks in Treatment: 5 Debridement Performed for Wound #4 Right,Distal,Lateral Lower Leg Assessment: Performed By: Physician Ricard Dillon., MD Debridement Type: Debridement Severity of Tissue Pre Fat layer exposed Debridement: Level of Consciousness (Pre- Awake and Alert procedure): Pre-procedure Verification/Time Out Taken: Yes - 09:00 Start Time: 09:01 Pain Control: Lidocaine 4% Topical Solution Total Area Debrided (L x W): 2.8 (cm) x 2.7 (cm) = 7.56 (cm) Tissue and other material Non-Viable, Slough, Subcutaneous, Fibrin/Exudate, Slough debrided: Level: Skin/Subcutaneous Tissue Debridement Description: Excisional Instrument: Curette Bleeding: Minimum Hemostasis Achieved:  Pressure End Time: 09:08 Procedural Pain: 0 Post Procedural Pain: 0 Response to Treatment: Procedure was tolerated well Level of Consciousness Awake and Alert (Post-procedure): Post Debridement Measurements of Total Wound Length: (cm) 2.8 Width: (cm) 2.7 Depth: (cm) 0.1 Volume: (cm) 0.594 Character of Wound/Ulcer Post Requires Further Debridement Debridement: Severity of Tissue Post Debridement: Fat layer exposed Post Procedure Diagnosis Same as Pre-procedure Electronic Signature(s) Signed: 11/15/2019 4:55:35 PM By: Deon Pilling Signed: 11/15/2019 7:02:26 PM By: Linton Ham MD Entered By: Linton Ham on 11/15/2019 09:25:26 -------------------------------------------------------------------------------- HPI Details Patient Name: Date of Service: NASSIM, COSMA 11/15/2019 8:30 AM Medical Record [id number] Patient Account Number: [id number] Date of Birth/Sex: Treating RN: June 10, 1941 (79 y.o. [name] Provider: Glennon Mac., ROBERT Other Clinician: Referring Provider:  Treating Provider/Extender:Jaxsyn Azam, Pamella Pert., ROBERT Weeks in Treatment: 5 History of Present Illness HPI Description: ADMISSION 10/11/2019 This is a 79 year old man with a history of a severe cardiomyopathy with an ejection fraction of about 20%, chronic renal failure stage III. He is listed as a type II diabetic in epic although the patient denies this. He also has a history of PVD. He states for the last month he has had wounds on his bilateral lower extremities that started off as blisters which denuded. He has areas on the left lateral calf and 2 on the right lateral. He has an area on the left first met head which he did not know was there he we identified this on intake. He has been using Silvadene cream provided by his primary care physician but he is complaining that this burns. 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 He was acutely ill at that time complicating GI bleed. ABIs in our clinic were 1.16 on the right and 1.13 on the left 2/25; the patient comes in with his areas on the left lateral and right lateral calf. There is also an area over the left first MTP bunion deformity. We have been using Sorbact. His edema control is fairly good 3/4; left lateral and right lateral calf. Most of his wounds are in the same position tightly adherent nonviable debris. On the right we debrided the superior wound on the left both wounds. The area on his bunion over the left first MTP medially is I think just about closed. We have been using Sorbact without a lot of success changed to Iodoflex under compression 3/11; left lateral  and right lateral calf perhaps minor improvement in the surface condition. We have been using Iodoflex. The area over the bunion of the left first MTP has closed over 3/18; left lateral and right lateral calf not much improvement. We have been using Iodoflex. Aggressive debridement last week. Electronic Signature(s) Signed: 11/15/2019 7:02:26 PM By: Linton Ham MD Entered By: Linton Ham on 11/15/2019 09:26:20 -------------------------------------------------------------------------------- Physical Exam Details Patient Name: Date of Service: KAMONTE, MCMICHEN 11/15/2019 8:30 AM Medical Record [id number] Patient Account Number: [id number] Date of Birth/Sex: Treating RN: [birth year] (79 y.o. [name] Provider: Glennon Mac., ROBERT Other Clinician: Referring Provider: Treating Provider/Extender:Ryann Leavitt, Pamella Pert., ROBERT Weeks in Treatment: 5 Constitutional Sitting or standing Blood Pressure is within target range for patient.. Pulse regular and within  target range for patient.Marland Kitchen Respirations regular, non-labored and within target range.. Temperature is normal and within the target range for the patient.Marland Kitchen Appears in no distress. Cardiovascular His pedal pulses are palpable. Notes Wound exam; the patient has essentially mirror-image wounds on the right lateral and left lateral 2 wounds on each side. None viable surfaces. Again an aggressive debridement today with a #5 curette but on the right I am really not able to get down to a viable surface where on the left the surface seems to be better post debridement. Removing necrotic skin subcutaneous tissue over the wound surfaces. Also notable on the right I think there is edema fluid leaking out of the wounds. There is no evidence of surrounding infection Electronic Signature(s) Signed: 11/15/2019 7:02:26 PM By: Linton Ham MD Entered By: Linton Ham on 11/15/2019  09:28:40 -------------------------------------------------------------------------------- Physician Orders Details Patient Name: Date of Service: LINDELL, RENFREW 11/15/2019 8:30 AM Medical Record [id number] Patient Account Number: [id number] Date of Birth/Sex: Treating RN: Jan 15, 1941 (79 y.o. 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 - Thursday Dressing Change Frequency Other: - change twice a week by home health. Wound Center to change weekly. Skin Barriers/Peri-Wound Care Moisturizing lotion TCA Cream or Ointment - liberally in clinic today mixed with lotion. Wound Cleansing May shower with protection. - use cast protectors on the days dressings are not changed. May shower and wash wound with soap and water. - with dressing changes only. Primary Wound Dressing Wound #1 Left,Lateral Lower Leg Iodoflex - if home health does not have iodoflex may use iodosorb ointment. Wound #3 Right,Proximal,Lateral Lower Leg Iodoflex - if home health does not have iodoflex may use iodosorb ointment. Wound #4 Right,Distal,Lateral Lower Leg Iodoflex - if home health does not have iodoflex may use iodosorb ointment. Secondary Dressing ABD pad Other: - pad left medial first met head for protection. Edema Control 4 layer compression -  Bilateral - ensure to wrap from foot to just below calf. Avoid standing for long periods of time Elevate legs to the level of the heart or above for 30 minutes daily and/or when sitting, a frequency of: - throughout the day. [facility] for wound care. - [facility] Venous Studies -Bilateral - Venous reflux studies bilaterally related to nonhealing ulcers. - (ICD10 I87.323 - Chronic venous hypertension (idiopathic) with inflammation of bilateral lower extremity) Arterial Studies- Bilateral - Arterial studies with ABIs and TBIs bilaterally related to non-healing ulcers. - (ICD10 [id number] - Chronic venous hypertension (idiopathic) with inflammation of bilateral lower extremity) Electronic Signature(s) Signed: 11/15/2019 4:55:35 PM By: Deon Pilling Signed: 11/15/2019 7:02:26 PM By: Linton Ham MD Entered By: Deon Pilling on 11/15/2019 09:11:21 -------------------------------------------------------------------------------- Problem List Details Patient Name: Date of Service: HARUO, STEPANEK 11/15/2019 8:30 AM Medical Record [id number] Patient Account Number: [id number] Date of Birth/Sex: Treating RN: 14-Sep-1940 (79 y.o. [name] Provider: Glennon Mac., ROBERT Other Clinician: Referring Provider: Treating Provider/Extender:Rollan Roger, Pamella Pert.,  ROBERT Weeks in Treatment: 5 Active Problems ICD-10 Evaluated Encounter Code Description Active Date Today Diagnosis I87.323 Chronic venous hypertension (idiopathic) with 10/11/2019 No Yes inflammation of bilateral lower extremity I89.0 Lymphedema, not elsewhere classified 10/11/2019 No Yes L97.822 Non-pressure chronic ulcer of other part of left lower 10/11/2019 No Yes leg with fat layer exposed L97.112 Non-pressure chronic ulcer of right thigh with fat layer 10/11/2019 No Yes exposed L97.521 Non-pressure chronic ulcer of other part of left  foot 10/11/2019 No Yes limited to breakdown of skin Inactive Problems Resolved Problems Electronic Signature(s) Signed: 11/15/2019 7:02:26 PM By: Linton Ham MD Entered By: Linton Ham on 11/15/2019 09:24:34 -------------------------------------------------------------------------------- Progress Note Details Patient Name: Date of Service: AMANUEL, SINKFIELD 11/15/2019 8:30 AM Medical Record [id number] Patient Account Number: [id number] Date of Birth/Sex: Treating RN: 1940/11/06 (79 y.o. [name] Provider: Glennon Mac., ROBERT Other Clinician: Referring Provider: Treating Provider/Extender:Eytan Carrigan, Pamella Pert., ROBERT Weeks in Treatment: 5 Subjective History of Present Illness (HPI) ADMISSION 10/11/2019 This is a 79 year old man with a history of a severe cardiomyopathy with an ejection fraction of about 20%, chronic renal failure stage III. He is listed as a type II diabetic in epic although the patient denies this. He also has a history of PVD. He states for the last month he has had wounds on his bilateral lower extremities that started off as blisters which denuded. He has areas on the left lateral calf and 2 on the right lateral. He has an area on the left first met head which he did not know was there he we identified this on intake. He has been using Silvadene cream provided by his primary care physician but he is complaining that this burns. 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 He was acutely ill at that time complicating GI bleed. ABIs in our clinic were 1.16 on the right and 1.13  on the left 2/25; the patient comes in with his areas on the left lateral and right lateral calf. There is also an area over the left first MTP bunion deformity. We have been using Sorbact. His edema control is fairly good 3/4; left lateral and right lateral calf. Most of his wounds are in the same position tightly adherent nonviable debris. On the right we debrided the superior wound on the left both wounds. The area on his bunion over the left first MTP medially is I think just about closed. We have been using Sorbact without a lot of success changed to Iodoflex under compression 3/11; left lateral and right lateral calf perhaps minor improvement in the surface condition. We have been using Iodoflex. The area over the bunion of the left first MTP has closed over 3/18; left lateral and right lateral calf not much improvement. We have been using Iodoflex. Aggressive debridement last week. Objective Constitutional Sitting or standing Blood Pressure is within target range for patient.. Pulse regular and within target range for patient.Marland Kitchen Respirations regular, non-labored and within target range.. Temperature is normal and within the target range for the patient.Marland Kitchen Appears in no distress. Vitals Time Taken: 8:30 AM, Height: 71 in, Weight: 198 lbs, BMI: 27.6, Temperature: 98.8 F, Pulse: 91 bpm, Respiratory Rate:  21 breaths/min, Blood Pressure: 123/71 mmHg. Cardiovascular His pedal pulses are palpable. General Notes: Wound exam; the patient has essentially mirror-image wounds on the right lateral and left lateral 2 wounds on each side. None viable surfaces. Again an aggressive debridement today with a #5 curette but on the right I am really not able to get down to a viable surface where on the left the surface seems to be better post debridement. Removing necrotic skin subcutaneous tissue over the wound surfaces. Also notable on the right I think there is edema fluid leaking out of the wounds.  There is no evidence of surrounding infection Integumentary (Hair, Skin) Wound #1 status is Open. Original cause of wound was Blister. The wound is located on the Left,Lateral Lower Leg. The wound measures 7.3cm length x 4.5cm width x 0.2cm depth; 25.8cm^2 area and 5.16cm^3 volume. There is Fat Layer (Subcutaneous Tissue) Exposed exposed. There is no tunneling or undermining noted. There is a medium amount of serosanguineous drainage noted. The wound margin is distinct with the outline attached to the wound base. There is small (1-33%) pink granulation within the wound bed. There is a large (67-100%) amount of necrotic tissue within the wound bed including Adherent Slough. Wound #3 status is Open. Original cause of wound was Blister. The wound is located on the Right,Proximal,Lateral Lower Leg. The wound measures 2.8cm length x 2cm width x 0.1cm depth; 4.398cm^2 area and 0.44cm^3 volume. There is Fat Layer (Subcutaneous Tissue) Exposed exposed. There is no tunneling or undermining noted. There is a medium amount of serosanguineous drainage noted. The wound margin is distinct with the outline attached to the wound base. There is small (1-33%) pink granulation within the wound bed. There is a large (67-100%) amount of necrotic tissue within the wound bed including Adherent Slough. Wound #4 status is Open. Original cause of wound was Blister. The wound is located on the Right,Distal,Lateral Lower Leg. The wound measures 2.8cm length x 2.7cm width x 0.1cm depth; 5.938cm^2 area and 0.594cm^3 volume. There is Fat Layer (Subcutaneous Tissue) Exposed exposed. There is no tunneling or undermining noted. There is a medium amount of serosanguineous drainage noted. The wound margin is distinct with the outline attached to the wound base. There is medium (34-66%) pink granulation within the wound bed. There is a medium (34-66%) amount of necrotic tissue within the wound bed including Adherent  Slough. 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 There was a Excisional Skin/Subcutaneous Tissue Debridement with a total area of 25 sq cm performed by Ricard Dillon., MD. With the following instrument(s): Curette to remove Non-Viable tissue/material. Material removed includes Subcutaneous Tissue, Slough, and Fibrin/Exudate after achieving pain control using Lidocaine 4% Topical Solution. A time out was conducted at 09:00, prior to the start of the procedure. A Minimum amount of bleeding was controlled with Pressure. The procedure was tolerated well with a pain level of 0 throughout and a pain level of 0 following the procedure. Post Debridement Measurements: 7.3cm length x 4.5cm width x 0.2cm depth; 5.16cm^3 volume. Character of Wound/Ulcer Post  Debridement requires further debridement. Severity of Tissue Post Debridement is: Fat layer exposed. Post procedure Diagnosis Wound #1: Same as Pre-Procedure Pre-procedure diagnosis of Wound #1 is a Venous Leg Ulcer located on the Left,Lateral Lower Leg . There was a Four Layer Compression Therapy Procedure with a pre-treatment ABI of 1.1 by Baruch Gouty, RN. Post procedure Diagnosis Wound #1: Same as Pre-Procedure Wound #3 Pre-procedure diagnosis of Wound #3 is a Venous Leg Ulcer located on the Right,Proximal,Lateral Lower Leg .Severity of Tissue Pre Debridement is: Fat layer exposed. There was a Excisional Skin/Subcutaneous  Tissue Debridement with a total area of 5.6 sq cm performed by Ricard Dillon., MD. With the following instrument(s): Curette to remove Non-Viable tissue/material. Material removed includes Subcutaneous Tissue, Slough, and Fibrin/Exudate after achieving pain control using Lidocaine 4% Topical Solution. A time out was conducted at 09:00, prior to the start of the procedure. A Minimum amount of bleeding was controlled with Pressure. The procedure was tolerated well with a pain level of 0 throughout and a pain level of 0 following the procedure. Post Debridement Measurements: 2.8cm length x 2cm width x 0.1cm depth; 0.44cm^3 volume. Character of Wound/Ulcer Post Debridement requires further debridement. Severity of Tissue Post Debridement is: Fat layer exposed. 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 There was a Four Layer Compression Therapy Procedure with a pre-treatment ABI of 1.1 by Baruch Gouty, RN. Post procedure Diagnosis Wound #3: Same as Pre-Procedure Wound #4 Pre-procedure diagnosis of Wound #4 is a Venous Leg Ulcer located on the Right,Distal,Lateral Lower Leg .Severity of Tissue Pre Debridement is: Fat layer exposed. There was a Excisional Skin/Subcutaneous Tissue Debridement with a total area of 7.56 sq cm performed by Ricard Dillon., MD. With the following instrument(s): Curette to remove Non-Viable tissue/material. Material removed includes Subcutaneous Tissue, Slough, and Fibrin/Exudate after achieving pain control using Lidocaine 4% Topical Solution. A time out was conducted at 09:00, prior to the start of the procedure. A Minimum amount of bleeding was controlled with Pressure. The procedure was tolerated well with a pain level of 0 throughout and a pain level of 0 following the procedure. Post Debridement Measurements: 2.8cm length x 2.7cm width x  0.1cm depth; 0.594cm^3 volume. Character of Wound/Ulcer Post Debridement requires further debridement. Severity of Tissue Post Debridement is: Fat layer exposed. 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 There was a Four Layer Compression Therapy Procedure with a pre-treatment ABI of 1.1 by Baruch Gouty, RN. Post procedure Diagnosis Wound #4: Same as Pre-Procedure Plan Follow-up Appointments: Return Appointment in 1 week. - Thursday Dressing Change Frequency: Other: - change twice a week by home health. Wound Center to change weekly. Skin Barriers/Peri-Wound Care: Moisturizing lotion TCA Cream or Ointment - liberally in clinic today mixed with lotion. Wound Cleansing: May shower with protection. - use cast protectors on the days dressings are not changed. May shower and wash wound with soap and water. - with dressing changes only. Primary Wound Dressing: Wound #1 Left,Lateral Lower Leg: Iodoflex - if home health does not have iodoflex may use iodosorb ointment. Wound #3 Right,Proximal,Lateral Lower Leg: Iodoflex - if home health does not have iodoflex may use iodosorb ointment. Wound #4 Right,Distal,Lateral Lower Leg: Iodoflex - if home health does not have iodoflex may use iodosorb ointment. Secondary Dressing: ABD pad  Other: - pad left medial first met head for protection. Edema Control: 4 layer compression - Bilateral - ensure to wrap from foot to just below calf. Avoid standing for long periods of time Elevate legs to the level of the heart or above for 30 minutes daily and/or when sitting, a frequency of: - throughout the day. Home Health: [facility] for wound care. 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 1. I am continuing with Iodoflex post debridement today 2. The patient has a lot of medical problems including cardiomyopathy. He is not systemically fluid volume overloaded. Nevertheless I think we are going to have to go ahead with arterial and venous reflux studies. I think he may have problems in both areas although his ABIs were not that bad as we measured them in the clinic. 3. Especially on the right there is just not a viable surface here and I am really getting down to some depth 4. He handled 4-layer compression I am continuing that with that. Electronic Signature(s) Signed: 11/15/2019 7:02:26 PM By: Linton Ham MD Entered By: Linton Ham on 11/15/2019 09:29:59 -------------------------------------------------------------------------------- SuperBill Details Patient Name: Date of Service: HARIM, BI 11/15/2019 Medical Record [id number] Patient Account Number: [id number] Date of Birth/Sex: Treating RN: 1941/03/10 (79 y.o. 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 Facility Procedures The patient participates with Medicare or their insurance follows the Medicare Facility Guidelines: CPT4 Code Description Modifier Quantity [phone] [id number] - DEB SUBQ TISSUE 20 SQ CM/< 1 ICD-10 Diagnosis  Description L97.822 Non-pressure chronic ulcer of  other part of left lower leg with fat layer exposed 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 Physician Procedures CPT4 Code Description: [id number] 11042 - WC PHYS SUBQ TISS 20 SQ CM ICD-10 Diagnosis Description L97.822 Non-pressure chronic ulcer of other part of left lower leg L97.112 Non-pressure chronic ulcer of right thigh with fat layer e Modifier: with fat laye xposed Quantity: 1 r exposed CPT4 Code Description: [id number] - WC PHYS SUBQ TISS EA ADDL 20 CM ICD-10 Diagnosis Description L97.822 Non-pressure chronic ulcer of other part of left lower leg L97.112 Non-pressure chronic ulcer of right thigh with fat layer e Modifier: with fat laye xposed Quantity: 1 r exposed Electronic Signature(s) Signed: 11/15/2019 7:02:26 PM By: Linton Ham MD Entered By: Linton Ham on 11/15/2019 09:31:43

## 2019-11-22 ENCOUNTER — Other Ambulatory Visit: Payer: Self-pay

## 2019-11-22 ENCOUNTER — Encounter (HOSPITAL_BASED_OUTPATIENT_CLINIC_OR_DEPARTMENT_OTHER): Payer: Medicare Other | Admitting: Internal Medicine

## 2019-11-22 DIAGNOSIS — I87312 Chronic venous hypertension (idiopathic) with ulcer of left lower extremity: Secondary | ICD-10-CM | POA: Diagnosis not present

## 2019-11-22 NOTE — Progress Notes (Signed)
[name], [name] ([id number]) Visit Report for 11/22/2019 Arrival Information Details Patient Name: Date of Service: [name], [name] 11/22/2019 8:30 AM Medical Record [id number] Patient Account Number: [id number] Date of Birth/Sex: Treating RN: Mar 04, 1941 (79 y.o. [name] Primary Care Keely Drennan: Glennon Mac., ROBERT Other Clinician: Referring Hakop Humbarger: Treating Genova Kiner/Extender:Robson, Pamella Pert., Tacy Learn in Treatment: 6 Visit Information History Since Last Visit Added or deleted any medications: No Patient Arrived: [name] Any new allergies or adverse reactions: No Arrival Time: 08:11 Had a fall or experienced change in No Accompanied By: self activities of daily living that may affect Transfer Assistance: None risk of falls: Patient Identification Verified: Yes Signs or symptoms of abuse/neglect since last No Secondary Verification Process Yes visito Completed: Hospitalized since last visit: No Patient Requires Transmission- No Implantable device outside of the clinic excluding No Based Precautions: cellular tissue based products placed in the center Patient Has Alerts: Yes since last visit: Patient Alerts: Patient on Blood Has Dressing in Place as Prescribed: Yes Thinner Has Compression in Place as Prescribed: Yes Pain Present Now: No Electronic Signature(Troy) Signed: 11/22/2019 6:20:13 PM By: Kela Millin Entered By: Kela Millin on 11/22/2019 08:11:42 -------------------------------------------------------------------------------- Compression Therapy Details Patient Name: Date of Service: [name], [name] 11/22/2019 8:30 AM Medical Record [id number] Patient Account Number: [id number] Date of Birth/Sex: Treating RN: [birth year] (79 y.o. [name] Ellary Casamento: Glennon Mac., ROBERT Other Clinician: Referring Deshayla Empson: Treating Genetta Fiero/Extender:Robson, Pamella Pert., ROBERT Weeks in Treatment:  6 Compression Therapy Performed for Wound Wound #1 Left,Lateral Lower Leg Assessment: Performed By: Clinician Baruch Gouty, RN Compression Type: Four Layer Post Procedure Diagnosis Same as Pre-procedure Electronic Signature(Troy) Signed: 11/22/2019 6:22:19 PM By: Deon Pilling Entered By: Deon Pilling on 11/22/2019 09:28:08 -------------------------------------------------------------------------------- Compression Therapy Details Patient Name: Date of Service: [name] 11/22/2019 8:30 AM Medical Record [id number] Patient Account Number: [id number] Date of Birth/Sex: Treating RN: 18-Dec-1940 (79 y.o. [name] Molli Gethers: Glennon Mac., ROBERT Other Clinician: Referring Kailene Steinhart: Treating Ladarius Seubert/Extender:Robson, Pamella Pert., ROBERT Weeks in Treatment: 6 Compression Therapy Performed for Wound Wound #3 Right,Proximal,Lateral Lower Leg Assessment: Performed By: Clinician Baruch Gouty, RN Compression Type: Four Layer Post Procedure Diagnosis Same as Pre-procedure Electronic Signature(Troy) Signed: 11/22/2019 6:22:19 PM By: Deon Pilling Entered By: Deon Pilling on 11/22/2019 09:28:08 -------------------------------------------------------------------------------- Compression Therapy Details Patient Name: Date of Service: [name], [name] 11/22/2019 8:30 AM Medical Record [id number] Patient Account Number: [id number] Date of Birth/Sex: Treating RN: [birth year] (79 y.o. [name] Iasiah Ozment: Glennon Mac., ROBERT Other Clinician: Referring Domnique Vantine: Treating Hiawatha Merriott/Extender:Robson, Pamella Pert., ROBERT Weeks in Treatment: 6 Compression Therapy Performed for Wound Wound #4 Right,Distal,Lateral Lower Leg Assessment: Performed By: Clinician Baruch Gouty, RN Compression Type: Four Layer Post Procedure Diagnosis Same as Pre-procedure Electronic Signature(Troy) Signed: 11/22/2019 6:22:19 PM By: Deon Pilling Entered By: Deon Pilling on 11/22/2019 09:28:08 -------------------------------------------------------------------------------- Encounter Discharge Information Details Patient Name: Date of Service: [name], [name] 11/22/2019 8:30 AM Medical Record [id number] Patient Account Number: [id number] Date of Birth/Sex: Treating RN: Feb 22, 1941 (79 y.o. [name] Primary Care Gerica Koble: Glennon Mac., ROBERT Other Clinician: Referring Forever Arechiga: Treating Lilli Dewald/Extender:Robson, Pamella Pert., Tacy Learn in Treatment: 6 Encounter Discharge Information Items Post Procedure Vitals Discharge Condition: Stable Temperature (F): 97.8 Ambulatory Status: Cane Pulse (bpm): 101 Discharge Destination: Home Respiratory Rate (breaths/min): 18 Transportation: Other Blood Pressure (mmHg): 129/70 Accompanied By: self Schedule Follow-up Appointment: Yes Clinical Summary of Care: Patient Declined Notes SCAT Electronic Signature(Troy) Signed: 11/22/2019 5:37:17 PM By: Baruch Gouty RN, BSN Entered By: Johna Roles,  Linda on 11/22/2019 09:27:00 -------------------------------------------------------------------------------- Lower Extremity Assessment Details Patient Name: Date of Service: [name], [name] 11/22/2019 8:30 AM Medical Record [id number] Patient Account Number: [id number] Date of Birth/Sex: Treating RN: [birth year] (79 y.o. [name] Primary Care Dhanvin Szeto: Glennon Mac., ROBERT Other Clinician: Referring Estrella Alcaraz: Treating Talmage Teaster/Extender:Robson, Pamella Pert., ROBERT Weeks in Treatment: 6 Edema Assessment Assessed: [Left: No] [Right: No] Edema: [Left: Yes] [Right: Yes] Calf Left: Right: Point of Measurement: 43 cm From Medial Instep 36.5 cm 38.5 cm Ankle Left: Right: Point of Measurement: 14 cm From Medial Instep 24 cm 24.5 cm Vascular Assessment Pulses: Dorsalis Pedis Palpable: [Left:No] [Right:No] Electronic Signature(Troy) Signed:  11/22/2019 6:20:13 PM By: Kela Millin Entered By: Kela Millin on 11/22/2019 08:19:17 -------------------------------------------------------------------------------- Multi Wound Chart Details Patient Name: Date of Service: [name], [name] 11/22/2019 8:30 AM Medical Record [id number] Patient Account Number: [id number] Date of Birth/Sex: Treating RN: 11-06-1940 (79 y.o. [name] Lauramae Kneisley: Glennon Mac., ROBERT Other Clinician: Referring Leimomi Zervas: Treating Dustina Scoggin/Extender:Robson, Pamella Pert., ROBERT Weeks in Treatment: 6 Vital Signs Height(in): 71 Pulse(bpm): 101 Weight(lbs): 198 Blood Pressure(mmHg): 129/70 Body Mass Index(BMI): 28 Temperature(F): 97.8 Respiratory 19 Rate(breaths/min): Photos: [1:No Photos] [3:No Photos] [4:No Photos] Wound Location: [1:Left Lower Leg - Lateral] [3:Right Lower Leg - Lateral, Right Lower Leg - Lateral, Proximal] [4:Distal] Wounding Event: [1:Blister] [3:Blister] [4:Blister] Primary Etiology: [1:Venous Leg Ulcer] [3:Venous Leg Ulcer] [4:Venous Leg Ulcer] Comorbid History: [1:Anemia, Congestive Heart Anemia, Congestive Heart Anemia, Congestive Heart Failure, Hypertension, Peripheral Venous Disease, Peripheral Venous Disease, Peripheral Venous Disease, 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 End  Stage Renal Disease] [3:Failure, Hypertension,] [4:Failure, Hypertension,] Date Acquired: [1:09/11/2019] [3:09/11/2019] [4:09/11/2019] Weeks of Treatment: [1:6] [3:6] [4:6] Wound Status: [1:Open] [3:Open] [4:Open] Clustered Wound: [1:Yes] [3:No] [4:No] Clustered Quantity: [1:2] [3:N/A] [4:N/A] Measurements L x W x D 4.5x4.5x0.2 [3:2.7x2.7x0.5] [4:2.8x3x0.2] (cm) Area (cm) : [1:15.904] [3:5.726] [4:6.597] Volume (cm) : [1:3.181] [3:2.863] [4:1.319] % Reduction in Area: [1:73.00%] [3:-15.70%] [4:29.10%] % Reduction in Volume: [1:46.00%] [3:-478.40%] [4:-41.80%] Classification: [1:Full Thickness Without  Exposed Support Structures Exposed Support Structures Exposed Support Structures] [3:Full Thickness Without] [4:Full Thickness Without] Exudate Amount: [1:Medium] [3:Medium] [4:Medium] Exudate Type: [1:Serosanguineous] [3:Serosanguineous] [4:Serosanguineous] Exudate Color: [1:red, brown] [3:red, brown] [4:red, brown] Wound Margin: [1:Distinct, outline attached Well defined, not attached Distinct, outline attached] Granulation Amount: [1:Small (1-33%)] [3:Small (1-33%)] [4:Small (1-33%)] Granulation Quality: [1:Pink] [3:Pink] [4:Pink] Necrotic Amount: [1:Large (67-100%)] [3:Large (67-100%)] [4:Medium (34-66%)] Necrotic Tissue: [1:Adherent Slough] [3:Adherent Slough] [4:Eschar, Adherent Slough] Exposed Structures: [1:Fat Layer (Subcutaneous Fat Layer (Subcutaneous Fat Layer (Subcutaneous Tissue) Exposed: Yes Fascia: No Tendon: No Muscle: No Joint: No Bone: No] [3:Tissue) Exposed: Yes Fascia: No Tendon: No Muscle: No Joint: No Bone: No] [4:Tissue) Exposed: Yes  Fascia: No Tendon: No Muscle: No Joint: No Bone: No] Epithelialization: [1:Small (1-33%)] [3:Small (1-33%)] [4:Small (1-33%)] Debridement: [1:Debridement - Excisional Debridement - Excisional Debridement - Excisional] Pre-procedure [1:08:49] [3:08:49] [4:08:49] Verification/Time Out Taken: Pain Control: [1:Lidocaine 4% Topical Solution] [3:Lidocaine 4% Topical Solution] [4:Lidocaine 4% Topical Solution] Tissue Debrided: [1:Subcutaneous, Slough] [3:Subcutaneous, Slough] [4:Subcutaneous, Slough] Level: [1:Skin/Subcutaneous Tissue] [3:Skin/Subcutaneous Tissue] [4:Skin/Subcutaneous Tissue] Debridement Area (sq cm):20.25 [3:7.29] [4:8.4] Instrument: [1:Curette] [3:Curette] [4:Curette] Bleeding: [1:Moderate] [3:Moderate] [4:Moderate] Hemostasis Achieved: [1:Pressure] [3:Pressure] [4:Silver Nitrate] Procedural Pain: [1:0] [3:0] [4:0] Post Procedural Pain: [1:3] [3:3] [4:3] Debridement Treatment Procedure was tolerated [3:Procedure was  tolerated] [4:Procedure was tolerated] Response: [1:well] [3:well] [4:well] Post Debridement [1:4.5x4.5x0.2] [3:2.7x2.7x0.5] [4:2.8x3x0.2] Measurements L x W x D (cm) Post Debridement [1:3.181] [3:2.863] [4:1.319] Volume: (cm) Procedures Performed: Compression Therapy [1:Debridement] [3:Compression Therapy Debridement] [4:Compression Therapy  Debridement] Treatment Notes Wound #1 (Left, Lateral Lower Leg) 2. Periwound Care Barrier cream Moisturizing lotion TCA Cream 3. Primary Dressing Applied Iodoflex 4. Secondary Dressing ABD Pad Dry Gauze 6. Support Layer Applied 4 layer compression wrap Wound #3 (Right, Proximal, Lateral Lower Leg) 2. Periwound Care Barrier cream Moisturizing lotion TCA Cream 3. Primary Dressing Applied Iodoflex 4. Secondary Dressing ABD Pad Dry Gauze 6. Support Layer Applied 4 layer compression wrap Wound #4 (Right, Distal, Lateral Lower Leg) 2. Periwound Care Barrier cream Moisturizing lotion TCA Cream 3. Primary Dressing Applied Iodoflex 4. Secondary Dressing ABD Pad Dry Gauze 6. Support Layer Applied 4 layer compression Water quality scientist) Signed: 11/22/2019 6:03:33 PM By: Linton Ham MD Signed: 11/22/2019 6:22:19 PM By: Deon Pilling Entered By: Linton Ham on 11/22/2019 10:26:10 -------------------------------------------------------------------------------- Multi-Disciplinary Care Plan Details Patient Name: Date of Service: [name], [name] 11/22/2019 8:30 AM Medical Record [id number] Patient Account Number: [id number] Date of Birth/Sex: Treating RN: 15-Sep-1940 (79 y.o. [name] Saifullah Jolley: Glennon Mac., ROBERT Other Clinician: Referring Ziaire Hagos: Treating Brinden Kincheloe/Extender:Robson, Pamella Pert., ROBERT Weeks in Treatment: 6 Active Inactive Abuse / Safety / Falls / Self Care Management Nursing Diagnoses: Potential for falls Goals: Patient will remain injury free related to  falls Date Initiated: 10/11/2019 Target Resolution Date: 12/28/2019 Goal Status: Active Patient/caregiver will verbalize understanding of the importance to maintain current immunizations/vaccinations Date Initiated: 10/11/2019 Date Inactivated: 11/08/2019 Target Resolution Date: 12/06/2019 Goal Status: Met Interventions: Assess self care needs on admission and as needed Provide education on fall prevention Treatment Activities: Patient referred to home care : 10/11/2019 Notes: Nutrition Nursing Diagnoses: Potential for alteratiion in Nutrition/Potential for imbalanced nutrition Goals: Patient/caregiver agrees to and verbalizes understanding of need to obtain nutritional consultation Date Initiated: 10/11/2019 Date Inactivated: 11/01/2019 Target Resolution Date: 11/09/2019 Goal Status: Met Patient/caregiver agrees to and verbalizes understanding of need to use nutritional supplements and/or vitamins as prescribed Date Initiated: 10/11/2019 Target Resolution Date: 12/28/2019 Goal Status: Active Interventions: Assess HgA1c results as ordered upon admission and as needed Provide education on nutrition Treatment Activities: Education provided on Nutrition : 11/15/2019 Obtain HgA1c : 10/11/2019 Patient referred to Primary Care Physician for further nutritional evaluation : 10/11/2019 Notes: Electronic Signature(Troy) Signed: 11/22/2019 6:22:19 PM By: Deon Pilling Entered By: Deon Pilling on 11/22/2019 08:02:01 -------------------------------------------------------------------------------- Pain Assessment Details Patient Name: Date of Service: [name], [name] 11/22/2019 8:30 AM Medical Record [id number] Patient Account Number: [id number] Date of Birth/Sex: Treating RN: 11/10/1940 (79 y.o. [name] Primary Care Cashus Halterman: Glennon Mac., ROBERT Other Clinician: Referring Vignesh Willert: Treating Harvey Lingo/Extender:Robson, Pamella Pert., ROBERT Weeks in Treatment: 6 Active  Problems Location of Pain Severity and Description of Pain Patient Has Paino No Site Locations Pain Management and Medication Current Pain Management: Electronic Signature(Troy) Signed: 11/22/2019 6:20:13 PM By: Kela Millin Entered By: Kela Millin on 11/22/2019 08:12:52 -------------------------------------------------------------------------------- Patient/Caregiver Education Details Patient Name: Date of Service: [name] 3/25/2021andnbsp8:30 AM Medical Record [id number] Patient Account Number: [id number] Date of Birth/Gender: Treating RN: 10-24-1940 (78 y.o. [name] Physician: Glennon Mac., ROBERT Other Clinician: Referring Physician: Treating Physician/Extender:Robson, Pamella Pert., Tacy Learn in Treatment: 6 Education Assessment Education Provided To: Patient Education Topics Provided Safety: Handouts: Personal Safety Methods: Explain/Verbal Responses: Reinforcements needed Electronic Signature(Troy) Signed: 11/22/2019 6:22:19 PM By: Deon Pilling Entered By: Deon Pilling on 11/22/2019 08:02:11 -------------------------------------------------------------------------------- Wound Assessment Details Patient Name: Date of Service: [name], [name] 11/22/2019 8:30 AM Medical Record [id number] Patient Account Number: [id number] Date of Birth/Sex: Treating RN: [birth year] (78 y.o. Gay Filler, Larene Beach  Primary Care Vernel Langenderfer: Glennon Mac., ROBERT Other Clinician: Referring Geremiah Fussell: Treating Shriyan Arakawa/Extender:Robson, Pamella Pert., ROBERT Weeks in Treatment: 6 Wound Status Wound Number: 1 Primary Venous Leg Ulcer Etiology: Wound Location: Left Lower Leg - Lateral Wound Open Wounding Event: Blister Status: Date Acquired: 09/11/2019 Comorbid Anemia, Congestive Heart Failure, Weeks Of Treatment: 6 History: Hypertension, Peripheral Venous Disease, Clustered Wound: Yes End Stage Renal Disease Wound  Measurements Length: (cm) 4.5 % Reduct Width: (cm) 4.5 % Reduct Depth: (cm) 0.2 Epitheli Clustered Quantity: 2 Tunnelin Area: (cm) 15.904 Undermi Volume: (cm) 3.181 Wound Description Full Thickness Without Exposed Support Foul Odo Classification: Structures Slough/F Wound Distinct, outline attached Margin: Exudate Medium Amount: Exudate Serosanguineous Type: Exudate red, brown Color: Wound Bed Granulation Amount: Small (1-33%) Granulation Quality: Pink Fascia E Necrotic Amount: Large (67-100%) Fat Laye Necrotic Quality: Adherent Slough Tendon E Muscle E Joint Ex Bone Exp r After Cleansing: No ibrino Yes Exposed Structure xposed: No r (Subcutaneous Tissue) Exposed: Yes xposed: No xposed: No posed: No osed: No ion in Area: 73% ion in Volume: 46% alization: Small (1-33%) g: No ning: No Treatment Notes Wound #1 (Left, Lateral Lower Leg) 2. Periwound Care Barrier cream Moisturizing lotion TCA Cream 3. Primary Dressing Applied Iodoflex 4. Secondary Dressing ABD Pad Dry Gauze 6. Support Layer Applied 4 layer compression wrap Electronic Signature(Troy) Signed: 11/22/2019 6:20:13 PM By: Kela Millin Entered By: Kela Millin on 11/22/2019 08:21:40 -------------------------------------------------------------------------------- Wound Assessment Details Patient Name: Date of Service: [name], [name] 11/22/2019 8:30 AM Medical Record [id number] Patient Account Number: [id number] Date of Birth/Sex: Treating RN: Dec 22, 1940 (79 y.o. [name] Primary Care Charnell Peplinski: Glennon Mac., ROBERT Other Clinician: Referring Noor Witte: Treating Sandeep Radell/Extender:Robson, Pamella Pert., ROBERT Weeks in Treatment: 6 Wound Status Wound Number: 3 Primary Venous Leg Ulcer Etiology: Wound Location: Right Lower Leg - Lateral, Proximal Wound Open Wounding Event: Blister Status: Date Acquired: 09/11/2019 Comorbid Anemia, Congestive Heart  Failure, Weeks Of Treatment: 6 History: Hypertension, Peripheral Venous Disease, Clustered Wound: No End Stage Renal Disease Wound Measurements Length: (cm) 2.7 % Reduc Width: (cm) 2.7 % Reduc Depth: (cm) 0.5 Epithel Area: (cm) 5.726 Tunnel Volume: (cm) 2.863 Underm Wound Description Full Thickness Without Exposed Support Foul Od Classification: Structures Slough/ Wound Well defined, not attached Margin: Exudate Medium Amount: Exudate Serosanguineous Type: Exudate red, brown Color: Wound Bed Granulation Amount: Small (1-33%) Granulation Quality: Pink Fascia Ex Necrotic Amount: Large (67-100%) Fat Layer Necrotic Quality: Adherent Slough Tendon Ex Muscle Ex Joint Exp Bone Expo or After Cleansing: No Fibrino Yes Exposed Structure posed: No (Subcutaneous Tissue) Exposed: Yes posed: No posed: No osed: No sed: No tion in Area: -15.7% tion in Volume: -478.4% ialization: Small (1-33%) ing: No ining: No Treatment Notes Wound #3 (Right, Proximal, Lateral Lower Leg) 2. Periwound Care Barrier cream Moisturizing lotion TCA Cream 3. Primary Dressing Applied Iodoflex 4. Secondary Dressing ABD Pad Dry Gauze 6. Support Layer Applied 4 layer compression wrap Electronic Signature(Troy) Signed: 11/22/2019 6:20:13 PM By: Kela Millin Entered By: Kela Millin on 11/22/2019 08:22:06 -------------------------------------------------------------------------------- Wound Assessment Details Patient Name: Date of Service: [name], [name] 11/22/2019 8:30 AM Medical Record [id number] Patient Account Number: [id number] Date of Birth/Sex: Treating RN: 1941/07/07 (79 y.o. [name] Primary Care Roshun Klingensmith: Glennon Mac., ROBERT Other Clinician: Referring Mahogony Gilchrest: Treating Sanjit Mcmichael/Extender:Robson, Pamella Pert., ROBERT Weeks in Treatment: 6 Wound Status Wound Number: 4 Primary Venous Leg Ulcer Etiology: Wound Location: Right Lower Leg - Lateral,  Distal Wound Open Wounding Event: Blister Status: Date Acquired: 09/11/2019 Comorbid Anemia, Congestive Heart Failure,  Weeks Of Treatment: 6 History: Hypertension, Peripheral Venous Disease, Clustered Wound: No End Stage Renal Disease Wound Measurements Length: (cm) 2.8 % Reducti Width: (cm) 3 % Reducti Depth: (cm) 0.2 Epithelia Area: (cm) 6.597 Tunnelin Volume: (cm) 1.319 Undermin Wound Description Classification: Full Thickness Without Exposed Support Foul O Structures Slough Wound Distinct, outline attached Margin: Exudate Medium Amount: Exudate Serosanguineous Type: Exudate red, brown Color: Wound Bed Granulation Amount: Small (1-33%) Granulation Quality: Pink Fascia Necrotic Amount: Medium (34-66%) Fat La Necrotic Quality: Eschar, Adherent Slough Tendon Muscle Joint Bone E dor After Cleansing: No /Fibrino Yes Exposed Structure Exposed: No yer (Subcutaneous Tissue) Exposed: Yes Exposed: No Exposed: No Exposed: No xposed: No on in Area: 29.1% on in Volume: -41.8% lization: Small (1-33%) g: No ing: No Treatment Notes Wound #4 (Right, Distal, Lateral Lower Leg) 2. Periwound Care Barrier cream Moisturizing lotion TCA Cream 3. Primary Dressing Applied Iodoflex 4. Secondary Dressing ABD Pad Dry Gauze 6. Support Layer Applied 4 layer compression wrap Electronic Signature(Troy) Signed: 11/22/2019 6:20:13 PM By: Kela Millin Entered By: Kela Millin on 11/22/2019 08:22:38 -------------------------------------------------------------------------------- Vitals Details Patient Name: Date of Service: ALIOUNE, HODGKIN 11/22/2019 8:30 AM Medical Record [id number] Patient Account Number: [id number] Date of Birth/Sex: Treating RN: 14-Mar-1941 (79 y.o. [name] Primary Care Kenika Sahm: Glennon Mac., ROBERT Other Clinician: Referring Lani Havlik: Treating Srija Southard/Extender:Robson, Pamella Pert., ROBERT Weeks in Treatment: 6 Vital  Signs Time Taken: 08:10 Temperature (F): 97.8 Height (in): 71 Pulse (bpm): 101 Weight (lbs): 198 Respiratory Rate (breaths/min): 19 Body Mass Index (BMI): 27.6 Blood Pressure (mmHg): 129/70 Reference Range: 80 - 120 mg / dl Electronic Signature(Troy) Signed: 11/22/2019 6:20:13 PM By: Kela Millin Entered By: Kela Millin on 11/22/2019 08:12:44

## 2019-11-22 NOTE — Progress Notes (Signed)
NEVAN, [name] ([id number]) Visit Report for 11/22/2019 Debridement Details Patient Name: Date of Service: [name], [name] 11/22/2019 8:30 AM Medical Record [id number] Patient Account Number: [id number] Date of Birth/Sex: Treating RN: Jun 15, 1941 (79 y.o. [name] Primary Care Provider: Glennon Mac., Patton Other Clinician: Referring Provider: Treating Provider/Extender:[name]: 6 Debridement Performed for Wound #1 Left,Lateral Lower Leg Assessment: Performed By: Physician [name] Debridement Type: Debridement Severity of Tissue Pre Fat layer exposed Debridement: Level of Consciousness (Pre- Awake and Alert procedure): Pre-procedure Verification/Time Out Taken: Yes - 08:49 Start Time: 08:50 Pain Control: Lidocaine 4% Topical Solution Total Area Debrided (L x W): 4.5 (cm) x 4.5 (cm) = 20.25 (cm) Tissue and other material Viable, Non-Viable, Slough, Subcutaneous, Skin: Dermis , Fibrin/Exudate, Slough debrided: Level: Skin/Subcutaneous Tissue Debridement Description: Excisional Instrument: Curette Bleeding: Moderate Hemostasis Achieved: Pressure End Time: 08:56 Procedural Pain: 0 Post Procedural Pain: 3 Response to Patton: Procedure was tolerated well Level of Consciousness Awake and Alert (Post-procedure): Post Debridement Measurements of Total Wound Length: (cm) 4.5 Width: (cm) 4.5 Depth: (cm) 0.2 Volume: (cm) 3.181 Character of Wound/Ulcer Post Requires Further Debridement Debridement: Severity of Tissue Post Debridement: Fat layer exposed Post Procedure Diagnosis Same as Pre-procedure Electronic Signature(s) Signed: 11/22/2019 6:03:33 PM By: Troy Ham Patton Signed: 11/22/2019 6:22:19 PM By: [name] Entered By: [name] on 11/22/2019 10:26:21 -------------------------------------------------------------------------------- Debridement Details Patient Name: Date of  Service: [name], [name] 11/22/2019 8:30 AM Medical Record [id number] Patient Account Number: [id number] Date of Birth/Sex: Treating RN: [birth year] (79 y.o. [name] Primary Care Provider: Glennon Mac., Patton Other Clinician: Referring Provider: Treating Provider/Extender:[name]: 6 Debridement Performed for Wound #3 Right,Proximal,Lateral Lower Leg Assessment: Performed By: Physician [name] Debridement Type: Debridement Severity of Tissue Pre Fat layer exposed Debridement: Level of Consciousness (Pre- Awake and Alert procedure): Pre-procedure Yes - 08:49 Verification/Time Out Taken: Start Time: 08:50 Pain Control: Lidocaine 4% Topical Solution Total Area Debrided (L x W): 2.7 (cm) x 2.7 (cm) = 7.29 (cm) Tissue and other material Viable, Non-Viable, Slough, Subcutaneous, Skin: Dermis , Fibrin/Exudate, Slough debrided: Level: Skin/Subcutaneous Tissue Debridement Description: Excisional Instrument: Curette Bleeding: Moderate Hemostasis Achieved: Pressure End Time: 08:56 Procedural Pain: 0 Post Procedural Pain: 3 Response to Patton: Procedure was tolerated well Level of Consciousness Awake and Alert (Post-procedure): Post Debridement Measurements of Total Wound Length: (cm) 2.7 Width: (cm) 2.7 Depth: (cm) 0.5 Volume: (cm) 2.863 Character of Wound/Ulcer Post Requires Further Debridement Debridement: Severity of Tissue Post Debridement: Fat layer exposed Post Procedure Diagnosis Same as Pre-procedure Electronic Signature(s) Signed: 11/22/2019 6:03:33 PM By: Troy Ham Patton Signed: 11/22/2019 6:22:19 PM By: [name] Entered By: [name] on 11/22/2019 10:26:30 -------------------------------------------------------------------------------- Debridement Details Patient Name: Date of Service: [name], [name] 11/22/2019 8:30 AM Medical Record [id number] Patient Account  Number: [id number] Date of Birth/Sex: Treating RN: November 24, 1940 (79 y.o. [name] Primary Care Provider: Glennon Mac., Patton Other Clinician: Referring Provider: Treating Provider/Extender:[name]: 6 Debridement Performed for Wound #4 Right,Distal,Lateral Lower Leg Assessment: Performed By: Physician [name] Debridement Type: Debridement Severity of Tissue Pre Fat layer exposed Debridement: Level of Consciousness (Pre- Awake and Alert procedure): Pre-procedure Verification/Time Out Taken: Yes - 08:49 Start Time: 08:50 Pain Control: Lidocaine 4% Topical Solution Total Area Debrided (L x W): 2.8 (cm) x 3 (cm) = 8.4 (cm) Tissue and other material Viable, Non-Viable, Slough, Subcutaneous, Skin: Dermis , Fibrin/Exudate, Slough debrided:  Level: Skin/Subcutaneous Tissue Debridement Description: Excisional Instrument: Curette Bleeding: Moderate Hemostasis Achieved: Silver Nitrate End Time: 08:56 Procedural Pain: 0 Post Procedural Pain: 3 Response to Patton: Procedure was tolerated well Level of Consciousness Awake and Alert (Post-procedure): Post Debridement Measurements of Total Wound Length: (cm) 2.8 Width: (cm) 3 Depth: (cm) 0.2 Volume: (cm) 1.319 Character of Wound/Ulcer Post Requires Further Debridement Debridement: Severity of Tissue Post Debridement: Fat layer exposed Post Procedure Diagnosis Same as Pre-procedure Electronic Signature(s) Signed: 11/22/2019 6:03:33 PM By: Troy Ham Patton Signed: 11/22/2019 6:22:19 PM By: [name] Entered By: [name] on 11/22/2019 10:26:40 -------------------------------------------------------------------------------- HPI Details Patient Name: Date of Service: [name], [name] 11/22/2019 8:30 AM Medical Record [id number] Patient Account Number: [id number] Date of Birth/Sex: Treating RN: Sep 14, 1940 (79 y.o. [name] Primary Care  Provider: Glennon Mac., Patton Other Clinician: Referring Provider: Treating Provider/Extender:[name]: 6 History of Present Illness HPI Description: ADMISSION 10/11/2019 This is a 79 year old man with a history of a severe cardiomyopathy with an ejection fraction of about 20%, chronic renal failure stage III. He is listed as a type II diabetic in epic although the patient denies this. He also has a history of PVD. He states for the last month he has had wounds on his bilateral lower extremities that started off as blisters which denuded. He has areas on the left lateral calf and 2 on the right lateral. He has an area on the left first met head which he did not know was there he we identified this on intake. He has been using Silvadene cream provided by his primary care physician but he is complaining that this burns. 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 He was acutely ill at that time complicating GI bleed. ABIs in our clinic were 1.16 on the right and 1.13 on the left 2/25; the patient comes in with his areas on the left lateral and right lateral calf. There is also an area over the left first MTP bunion deformity. We have been using Sorbact. His edema control is fairly good 3/4; left lateral and right lateral calf. Most of his wounds are in the same position tightly adherent nonviable debris. On the right we debrided the superior wound on the left both wounds. The area on his bunion over the left first MTP medially is I think just about closed. We have been using Sorbact without a lot of  success changed to Iodoflex under compression 3/11; left lateral and right lateral calf perhaps minor improvement in the surface condition. We have been using Iodoflex. The area over the bunion of the left first MTP has closed over 3/18; left lateral and right lateral calf not much improvement. We have been using Iodoflex. Aggressive debridement last week. 3/25; left lateral and right lateral calf. We have been using Iodoflex. There is some improvement in the superior area on the right and 2 on the lateral left although there is still a lot of debris on the surface. The inferior area on the right is still a completely nonviable surface. Electronic Signature(s) Signed: 11/22/2019 6:03:33 PM By: Troy Ham Patton Entered By: [name] on 11/22/2019 10:27:37 -------------------------------------------------------------------------------- Physical Exam  Details Patient Name: Date of Service: [name], [name] 11/22/2019 8:30 AM Medical Record [id number] Patient Account Number: [id number] Date of Birth/Sex: Treating RN: 1940-09-09 (79 y.o. [name] Primary Care Provider: Glennon Mac., Patton Other Clinician: Referring Provider: Treating Provider/Extender:Annesha Delgreco, Pamella Pert., Patton Weeks in Patton: 6 Constitutional Sitting or standing Blood Pressure is within target range for patient.. Pulse regular and within target range for patient.Marland Kitchen Respirations regular, non-labored and within target range.. Temperature is normal and within the target range for the patient.Marland Kitchen Appears in no distress. Notes Wound exam; the patient has essentially mirror-image wounds on the right lateral and left lateral 2 wounds on each side. The worst of these is the lower area on the right. A very difficult painful debridement. Completely necrotic surface. Hemostasis with silver nitrate. The other is a more reasonable debridement. Using a #5 curette and hemostasis in these areas direct  pressure. Electronic Signature(s) Signed: 11/22/2019 6:03:33 PM By: Troy Ham Patton Entered By: [name] on 11/22/2019 10:28:38 -------------------------------------------------------------------------------- Physician Orders Details Patient Name: Date of Service: [name], [name] 11/22/2019 8:30 AM Medical Record [id number] Patient Account Number: [id number] Date of Birth/Sex: Treating RN: [birth year] (79 y.o. 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 - Thursday Dressing Change Frequency Other: - change twice a week by home health. Skin Barriers/Peri-Wound Care Moisturizing lotion TCA Cream or Ointment - liberally in clinic today mixed with lotion. Wound Cleansing May shower with protection. - use cast protectors on the days dressings are not changed. May shower and wash wound with soap and water. - with dressing changes only. Primary Wound Dressing Wound #1 Left,Lateral Lower Leg Skin Substitute Application - run insurance approval for apligraft. Iodoflex - if home health does not have iodoflex may use iodosorb ointment. Wound #3 Right,Proximal,Lateral Lower Leg Iodoflex - if home health does not have iodoflex may use iodosorb ointment. run Biochemist, clinical for apligraft. Wound #4 Right,Distal,Lateral Lower  Leg Iodoflex - if home health does not have iodoflex may use iodosorb ointment. run Biochemist, clinical for apligraft. Secondary Dressing ABD pad Other: - pad left medial first met head for protection. Edema Control 4 layer compression - Bilateral - ensure to wrap from foot to just below calf. Avoid standing for long periods of time Elevate legs to the level of the heart or above for 30 minutes daily and/or when sitting, a frequency of: - throughout the day. [facility] for wound care. [facility] Electronic Signature(s) Signed: 11/22/2019 6:03:33 PM By: Troy Ham Patton Signed: 11/22/2019 6:22:19 PM By: [name] Entered By: [name] on 11/22/2019 09:03:36 -------------------------------------------------------------------------------- Problem List Details Patient Name: Date of Service: [name], [name] 11/22/2019 8:30 AM Medical Record [id number] Patient Account Number: [id number] Date of Birth/Sex: Treating RN: 12-17-1940 (79 y.o. [name] Primary Care Provider: Glennon Mac., Patton Other Clinician: Referring Provider: Treating Provider/Extender:Dejanay Wamboldt, Pamella Pert., Tacy Learn in [place]: 6 Active Problems ICD-10 Evaluated Encounter Code Description Active Date Today Diagnosis  J19.147 Chronic venous hypertension (idiopathic) with 10/11/2019 No Yes inflammation of bilateral lower extremity I89.0 Lymphedema, not elsewhere classified 10/11/2019 No Yes L97.822 Non-pressure chronic ulcer of other part of left lower 10/11/2019 No Yes leg with fat layer exposed L97.112 Non-pressure chronic ulcer of right thigh with fat layer 10/11/2019 No Yes exposed L97.521 Non-pressure chronic ulcer of other part of left foot 10/11/2019 No Yes limited to breakdown of skin Inactive Problems Resolved Problems Electronic Signature(s) Signed: 11/22/2019 6:03:33 PM By: Troy Ham Patton Entered By: [name] on  11/22/2019 10:25:36 -------------------------------------------------------------------------------- Progress Note Details Patient Name: Date of Service: [name], [name] 11/22/2019 8:30 AM Medical Record [id number] Patient Account Number: [id number] Date of Birth/Sex: Treating RN: 11-Jun-1941 (79 y.o. [name] Primary Care Provider: Glennon Mac., Patton Other Clinician: Referring Provider: Treating Provider/Extender:Keen Ewalt, Pamella Pert., Patton Weeks in Pat[place]: 6 Subjective History of Present Illness (HPI) ADMISSION 10/11/2019 This is a 79 year old man with a history of a severe cardiomyopathy with an ejection fraction of about 20%, chronic renal failure stage III. He is listed as a type II diabetic in epic although the patient denies this. He also has a history of PVD. He states for the last month he has had wounds on his bilateral lower extremities that started off as blisters which denuded. He has areas on the left lateral calf and 2 on the right lateral. He has an area on the left first met head which he did not know was there he we identified this on intake. He has been using Silvadene cream provided by his primary care physician but he is complaining that this burns. 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 He was acutely ill at that time complicating GI bleed. ABIs in our clinic were 1.16 on the right and 1.13 on the left 2/25; the patient comes in with his areas on the left lateral and right lateral calf. There is also an area over the left first MTP bunion deformity. We have been using Sorbact. His  edema control is fairly good 3/4; left lateral and right lateral calf. Most of his wounds are in the same position tightly adherent nonviable debris. On the right we debrided the superior wound on the left both wounds. The area on his bunion over the left first MTP medially is I think just about closed. We have been using Sorbact without a lot of success changed to Iodoflex under compression 3/11; left lateral and right lateral calf perhaps minor improvement in the surface condition. We have been using Iodoflex. The area over the bunion of the left first MTP has closed over 3/18; left lateral and right lateral calf not much improvement. We have been using Iodoflex. Aggressive debridement last week. 3/25; left lateral and right lateral calf. We have been using Iodoflex. There is some improvement in the superior area on the right and 2 on the lateral left although there is still a lot of debris on the surface. The inferior area on the right is still a completely nonviable surface. Objective Constitutional Sitting or standing Blood Pressure is within target range for patient.. Pulse regular and within target range for patient.Marland Kitchen Respirations regular, non-labored and within target range.Marland Kitchen  Temperature is normal and within the target range for the patient.Marland Kitchen Appears in no distress. Vitals Time Taken: 8:10 AM, Height: 71 in, Weight: 198 lbs, BMI: 27.6, Temperature: 97.8 F, Pulse: 101 bpm, Respiratory Rate: 19 breaths/min, Blood Pressure: 129/70 mmHg. General Notes: Wound exam; the patient has essentially mirror-image wounds on the right lateral and left lateral 2 wounds on each side. The worst of these is the lower area on the right. A very difficult painful debridement. Completely necrotic surface. Hemostasis with silver nitrate. The other is a more reasonable debridement. Using a #5 curette and hemostasis in these areas direct pressure. Integumentary (Hair, Skin) Wound #1 status is Open. Original  cause of wound was Blister. The wound is located on the Left,Lateral Lower Leg. The wound measures 4.5cm length x 4.5cm width x 0.2cm depth; 15.904cm^2 area and 3.181cm^3 volume. There is Fat Layer (Subcutaneous Tissue) Exposed exposed. There is no tunneling or undermining noted. There is a medium amount of serosanguineous drainage noted. The wound margin is distinct with the outline attached to the wound base. There is small (1-33%) pink granulation within the wound bed. There is a large (67-100%) amount of necrotic tissue within the wound bed including Adherent Slough. Wound #3 status is Open. Original cause of wound was Blister. The wound is located on the Right,Proximal,Lateral Lower Leg. The wound measures 2.7cm length x 2.7cm width x 0.5cm depth; 5.726cm^2 area and 2.863cm^3 volume. There is Fat Layer (Subcutaneous Tissue) Exposed exposed. There is no tunneling or undermining noted. There is a medium amount of serosanguineous drainage noted. The wound margin is well defined and not attached to the wound base. There is small (1-33%) pink granulation within the wound bed. There is a large (67-100%) amount of necrotic tissue within the wound bed including Adherent Slough. Wound #4 status is Open. Original cause of wound was Blister. The wound is located on the Right,Distal,Lateral Lower Leg. The wound measures 2.8cm length x 3cm width x 0.2cm depth; 6.597cm^2 area and 1.319cm^3 volume. There is Fat Layer (Subcutaneous Tissue) Exposed exposed. There is no tunneling or undermining noted. There is a medium amount of serosanguineous drainage noted. The wound margin is distinct with the outline attached to the wound base. There is small (1-33%) pink granulation within the wound bed. There is a medium (34-66%) amount of necrotic tissue within the wound bed including Eschar and Adherent Slough. 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 There was a Excisional Skin/Subcutaneous Tissue Debridement with a total area of 20.25 sq cm performed by [name]. With the following instrument(s): Curette to remove Viable and Non-Viable tissue/material. Material removed includes Subcutaneous Tissue, Slough, Skin: Dermis, and Fibrin/Exudate after achieving pain control using Lidocaine 4% Topical Solution. A time out was conducted at 08:49, prior to the start of the procedure. A Moderate amount of bleeding was controlled with Pressure. The procedure was tolerated well with a pain level of 0 throughout and a pain level of 3 following the procedure. Post Debridement Measurements: 4.5cm length x 4.5cm width x 0.2cm depth; 3.181cm^3  volume. Character of Wound/Ulcer Post Debridement requires further debridement. Severity of Tissue Post Debridement is: Fat layer exposed. Post procedure Diagnosis Wound #1: Same as Pre-Procedure Pre-procedure diagnosis of Wound #1 is a Venous Leg Ulcer located on the Left,Lateral Lower Leg . There was a Four Layer Compression Therapy Procedure by Baruch Gouty, RN. Post procedure Diagnosis Wound #1: Same as Pre-Procedure Wound #3 Pre-procedure diagnosis of Wound #3 is a Venous Leg Ulcer located on the Right,Proximal,Lateral Lower Leg .Severity of Tissue Pre Debridement is: Fat layer exposed. There was a Excisional Skin/Subcutaneous Tissue Debridement with a total area of 7.29 sq cm performed by [name]. With the  following instrument(s): Curette to remove Viable and Non-Viable tissue/material. Material removed includes Subcutaneous Tissue, Slough, Skin: Dermis, and Fibrin/Exudate after achieving pain control using Lidocaine 4% Topical Solution. A time out was conducted at 08:49, prior to the start of the procedure. A Moderate amount of bleeding was controlled with Pressure. The procedure was tolerated well with a pain level of 0 throughout and a pain level of 3 following the procedure. Post Debridement Measurements: 2.7cm length x 2.7cm width x 0.5cm depth; 2.863cm^3 volume. Character of Wound/Ulcer Post Debridement requires further debridement. Severity of Tissue Post Debridement is: Fat layer exposed. 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 There was a Four Layer Compression Therapy Procedure by Baruch Gouty, RN. Post procedure Diagnosis Wound #3: Same as Pre-Procedure Wound #4 Pre-procedure diagnosis of Wound #4 is a Venous Leg Ulcer located on the Right,Distal,Lateral Lower Leg .Severity of Tissue Pre Debridement is: Fat layer exposed. There was a Excisional Skin/Subcutaneous Tissue Debridement with a total area of 8.4 sq cm performed by [name]. With the following instrument(s): Curette to remove Viable and Non-Viable tissue/material. Material removed includes Subcutaneous Tissue, Slough, Skin: Dermis, and Fibrin/Exudate after achieving pain control using Lidocaine 4% Topical Solution. A time out was conducted at 08:49, prior to the start of the procedure. A Moderate amount of bleeding was controlled with Silver Nitrate. The procedure was tolerated well with a pain level of 0 throughout and a pain level of 3 following the procedure. Post Debridement Measurements: 2.8cm length x 3cm width x 0.2cm depth; 1.319cm^3 volume. Character of Wound/Ulcer Post  Debridement requires further debridement. Severity of Tissue Post Debridement is: Fat layer exposed. 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 There was a Four Layer Compression Therapy Procedure by Baruch Gouty, RN. Post procedure Diagnosis Wound #4: Same as Pre-Procedure Plan Follow-up Appointments: Return Appointment in 2 weeks. - Thursday Dressing Change Frequency: Other: - change twice a week by home health. Skin Barriers/Peri-Wound Care: Moisturizing lotion TCA Cream or Ointment - liberally in clinic today mixed with lotion. Wound Cleansing: May shower with protection. - use cast protectors on the days dressings are not changed. May shower and wash wound with soap and water. - with dressing changes only. Primary Wound Dressing: Wound #1 Left,Lateral Lower Leg: Skin Substitute Application - run insurance approval for apligraft. Iodoflex - if home health does not have iodoflex may use iodosorb ointment. Wound #3 Right,Proximal,Lateral Lower Leg: Iodoflex - if home health does not have iodoflex may use iodosorb ointment. run Biochemist, clinical for apligraft. Wound #4 Right,Distal,Lateral Lower Leg: Iodoflex - if home health does not have iodoflex may use iodosorb  ointment. run Biochemist, clinical for apligraft. Secondary Dressing: ABD pad Other: - pad left medial first met head for protection. Edema Control: 4 layer compression - Bilateral - ensure to wrap from foot to just below calf. Avoid standing for long periods of time Elevate legs to the level of the heart or above for 30 minutes daily and/or when sitting, a frequency of: - throughout the day. Home Health: [facility] for wound care. - [facility] 1. Continue with Iodoflex bilaterally ABDs under 4-layer compression he is tolerating this well. 2. I believe we have  home health changing the dressing 3. We are trying to get both arterial and venous reflux studies done they are booked for mid April. 4. I am going to put Apligraf through insurance if we can get the surface to a point where this would be reasonable I had like to be able to try it Electronic Signature(s) Signed: 11/22/2019 6:03:33 PM By: Troy Ham Patton Entered By: [name] on 11/22/2019 10:30:09 -------------------------------------------------------------------------------- SuperBill Details Patient Name: Date of Service: [name], [name] 11/22/2019 Medical Record [id number] Patient Account Number: [id number] Date of Birth/Sex: Treating RN: 1940-12-15 (79 y.o. 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 Facility Procedures The patient participates with Medicare or their insurance follows the Medicare Facility Guidelines: CPT4 Code Description Modifier Quantity [id number] 11042 - DEB SUBQ TISSUE 20 SQ CM/< 1 ICD-10 Diagnosis Description L97.822 Non-pressure chronic ulcer of  other part of left lower leg with fat layer exposed 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 The patient participates with Medicare or their insurance follows the Medicare Facility Guidelines: [id number] 11045 - DEB  SUBQ TISS EA ADDL 20CM 1 ICD-10 Diagnosis Description L97.521 Non-pressure chronic ulcer of other part of left foot limited to  breakdown of skin 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 Physician Procedures CPT4 Code Description: [id number] 32440 - WC PHYS SUBQ TISS 20 SQ CM ICD-10 Diagnosis Description L97.822 Non-pressure chronic ulcer of other part of left lower leg wi L97.112 Non-pressure chronic ulcer of right thigh with fat layer expo L97.521  Non-pressure chronic ulcer of other part of left foot limited Modifier: th fat layer exp sed to breakdown of Quantity: 1 osed skin CPT4 Code Description: [id number] 66440 - WC PHYS SUBQ TISS EA ADDL 20 CM ICD-10 Diagnosis Description L97.521 Non-pressure chronic ulcer of other part of left foot limited L97.112 Non-pressure chronic ulcer of right thigh with fat layer expo L97.822  Non-pressure chronic ulcer of other part of left lower leg wi Modifier: 1 to breakdown of sed th fat layer exp Quantity: skin osed Electronic Signature(s) Signed: 11/22/2019 6:03:33 PM By: Troy Ham Patton Entered By: [name] on 11/22/2019 10:30:27

## 2019-12-06 ENCOUNTER — Other Ambulatory Visit: Payer: Self-pay | Admitting: *Deleted

## 2019-12-06 ENCOUNTER — Encounter (HOSPITAL_BASED_OUTPATIENT_CLINIC_OR_DEPARTMENT_OTHER): Payer: Medicare Other | Attending: Internal Medicine | Admitting: Internal Medicine

## 2019-12-06 ENCOUNTER — Other Ambulatory Visit: Payer: Self-pay

## 2019-12-06 DIAGNOSIS — I429 Cardiomyopathy, unspecified: Secondary | ICD-10-CM | POA: Insufficient documentation

## 2019-12-06 DIAGNOSIS — I87333 Chronic venous hypertension (idiopathic) with ulcer and inflammation of bilateral lower extremity: Secondary | ICD-10-CM | POA: Insufficient documentation

## 2019-12-06 DIAGNOSIS — L97521 Non-pressure chronic ulcer of other part of left foot limited to breakdown of skin: Secondary | ICD-10-CM | POA: Insufficient documentation

## 2019-12-06 DIAGNOSIS — I890 Lymphedema, not elsewhere classified: Secondary | ICD-10-CM | POA: Diagnosis not present

## 2019-12-06 DIAGNOSIS — L97112 Non-pressure chronic ulcer of right thigh with fat layer exposed: Secondary | ICD-10-CM | POA: Insufficient documentation

## 2019-12-06 DIAGNOSIS — L97822 Non-pressure chronic ulcer of other part of left lower leg with fat layer exposed: Secondary | ICD-10-CM | POA: Diagnosis not present

## 2019-12-06 DIAGNOSIS — E1122 Type 2 diabetes mellitus with diabetic chronic kidney disease: Secondary | ICD-10-CM | POA: Insufficient documentation

## 2019-12-06 DIAGNOSIS — E1151 Type 2 diabetes mellitus with diabetic peripheral angiopathy without gangrene: Secondary | ICD-10-CM | POA: Diagnosis not present

## 2019-12-06 DIAGNOSIS — L97901 Non-pressure chronic ulcer of unspecified part of unspecified lower leg limited to breakdown of skin: Secondary | ICD-10-CM

## 2019-12-06 DIAGNOSIS — N183 Chronic kidney disease, stage 3 unspecified: Secondary | ICD-10-CM | POA: Diagnosis not present

## 2019-12-06 DIAGNOSIS — L97829 Non-pressure chronic ulcer of other part of left lower leg with unspecified severity: Secondary | ICD-10-CM | POA: Diagnosis present

## 2019-12-06 NOTE — Progress Notes (Signed)
Chart opened in error

## 2019-12-06 NOTE — Progress Notes (Signed)
NIXON, KOLTON ([id number]) Visit Report for 12/06/2019 HPI Details Patient Name: Date of Service: CHEVY, SWEIGERT 12/06/2019 8:30 AM Medical Record [id number] Patient Account Number: [id number] Date of Birth/Sex: Treating RN: 1940-09-08 (79 y.o. [name] Provider: Glennon Mac., ROBERT Other Clinician: Referring Provider: Treating Provider/Extender:Britten Seyfried, Pamella Pert., ROBERT Weeks in Treatment: 8 History of Present Illness HPI Description: ADMISSION 10/11/2019 This is a 79 year old man with a history of a severe cardiomyopathy with an ejection fraction of about 20%, chronic renal failure stage III. He is listed as a type II diabetic in epic although the patient denies this. He also has a history of PVD. He states for the last month he has had wounds on his bilateral lower extremities that started off as blisters which denuded. He has areas on the left lateral calf and 2 on the right lateral. He has an area on the left first met head which he did not know was there he we identified this on intake. He has been using Silvadene cream provided by his primary care physician but he is complaining that this burns. 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 He was acutely ill at that time complicating GI bleed. ABIs in our clinic were 1.16 on the right and 1.13 on the left 2/25; the patient comes in with his areas on the left lateral and right lateral calf. There is also an area over the left first MTP bunion deformity. We have been using Sorbact. His edema control is fairly good 3/4; left lateral and right  lateral calf. Most of his wounds are in the same position tightly adherent nonviable debris. On the right we debrided the superior wound on the left both wounds. The area on his bunion over the left first MTP medially is I think just about closed. We have been using Sorbact without a lot of success changed to Iodoflex under compression 3/11; left lateral and right lateral calf perhaps minor improvement in the surface condition. We have been using Iodoflex. The area over the bunion of the left first MTP has closed over 3/18; left lateral and right lateral calf not much improvement. We have been using Iodoflex. Aggressive debridement last week. 3/25; left lateral and right lateral calf. We have been using Iodoflex. There is some improvement in the superior area on the right and 2 on the lateral left although there is still a lot of debris on the surface. The inferior area on the right is still a completely nonviable surface. 4/8; left lateral and right lateral calfs. Essentially mirror-image looking wounds 2 wounds on each side in close juxtaposition we have been using Iodoflex with some improvement in the very adherent fibrinous debris but not a lot. The patient has arterial studies next Wednesday morning and venous studies next Thursday morning. I have been avoiding any further aggressive debridement until we see the arterial study results. His ABIs were fairly good in this clinic and is dorsalis pedis pulses are palpable but the wound beds are pale. Electronic Signature(s) Signed: 12/06/2019 5:58:56 PM By: Linton Ham MD Entered By: Linton Ham on 12/06/2019 09:36:36 -------------------------------------------------------------------------------- Physical Exam Details Patient Name: Date of Service: [name] 12/06/2019 8:30  AM Medical Record [id number] Patient Account Number: [id number] Date of Birth/Sex: Treating RN: Jan 01, 1941 (79 y.o. [name]  Provider: Glennon Mac., ROBERT Other Clinician: Referring Provider: Treating Provider/Extender:Lyndsy Gilberto, Pamella Pert., ROBERT Weeks in Treatment: 8 Cardiovascular Dorsalis pedis pulses are palpable. Some edema bilaterally. Nonpitting. Integumentary (Hair, Skin) Probably some degree of bilateral lymphedema. Notes Wound exam; the patient has essentially mirror-image wounds on the right lateral and left lateral calf mid level. 2 wounds on each side. The worst of these is on the lower part of the right. Again very fibrinous debris which is very difficult to debride. I did not do this today washed him off with wound cleanser and gauze. Electronic Signature(s) Signed: 12/06/2019 5:58:56 PM By: Linton Ham MD Entered By: Linton Ham on 12/06/2019 09:37:46 -------------------------------------------------------------------------------- Physician Orders Details Patient Name: Date of Service: TRAVELL, DESAULNIERS 12/06/2019 8:30 AM Medical Record [id number] Patient Account Number: [id number] Date of Birth/Sex: Treating RN: 1941/02/16 (79 y.o. 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 - Thursday. ***Patient to schedule appointment with wound center after vascular appointment.*** Dressing Change Frequency Other: - change twice a week  by home health. Skin Barriers/Peri-Wound Care Moisturizing lotion TCA Cream or Ointment - liberally in clinic today mixed with lotion. Wound Cleansing May shower with protection. - use cast protectors on the days dressings are not changed. May shower and wash wound with soap and water. - with dressing changes only. Primary Wound Dressing Wound #1 Left,Lateral Lower Leg Iodoflex - if home health does not have iodoflex may use iodosorb ointment. Wound #3 Right,Proximal,Lateral Lower Leg Iodoflex - if home health does not have iodoflex may use iodosorb ointment. Wound #4 Right,Distal,Lateral Lower Leg Iodoflex - if home health does not have iodoflex may use iodosorb ointment. Secondary Dressing ABD pad Other: - pad left medial first met head for protection. Edema Control 4 layer compression - Bilateral - ensure to wrap from foot to just below calf. Avoid standing for long periods of time Elevate legs to the level of the heart or above for 30 minutes daily and/or when sitting, a frequency of: - throughout the day. [facility] skilled [facility] for wound care. [facility] Electronic Signature(s) Signed: 12/06/2019 5:58:56 PM By: Linton Ham MD Signed: 12/06/2019 6:08:37 PM By: Deon Pilling Entered By: Deon Pilling on 12/06/2019 09:35:24 -------------------------------------------------------------------------------- Problem List Details Patient Name: Date of Service: TREYVON, BLAHUT 12/06/2019 8:30 AM Medical Record [id number] Patient Account Number: [id number] Date of Birth/Sex: Treating RN: 10-Nov-1940 (79 y.o. [name] Provider: Glennon Mac., ROBERT Other Clinician: Referring Provider: Treating Provider/Extender:Elizabella Nolet, Pamella Pert., Tacy Learn in Treatment: 8 Active Problems ICD-10 Evaluated Encounter Code Description Active Date Today Diagnosis I87.323 Chronic venous hypertension (idiopathic) with 10/11/2019  No Yes inflammation of bilateral lower extremity I89.0 Lymphedema, not elsewhere classified 10/11/2019 No Yes L97.822 Non-pressure chronic ulcer of other part of left lower 10/11/2019 No Yes leg with fat layer exposed L97.112 Non-pressure chronic  ulcer of right thigh with fat layer 10/11/2019 No Yes exposed L97.521 Non-pressure chronic ulcer of other part of left foot 10/11/2019 No Yes limited to breakdown of skin Inactive Problems Resolved Problems Electronic Signature(s) Signed: 12/06/2019 5:58:56 PM By: Linton Ham MD Entered By: Linton Ham on 12/06/2019 09:34:54 -------------------------------------------------------------------------------- Progress Note Details Patient Name: Date of Service: ANDRUE, DINI 12/06/2019 8:30 AM Medical Record [id number] Patient Account Number: [id number] Date of Birth/Sex: Treating RN: March 02, 1941 (79 y.o. [name] Provider: Glennon Mac., ROBERT Other Clinician: Referring Provider: Treating Provider/Extender:Essance Gatti, Pamella Pert., ROBERT Weeks in Treatment: 8 Subjective History of Present Illness (HPI) ADMISSION 10/11/2019 This is a 79 year old man with a history of a severe cardiomyopathy with an ejection fraction of about 20%, chronic renal failure stage III. He is listed as a type II diabetic in epic although the patient denies this. He also has a history of PVD. He states for the last month he has had wounds on his bilateral lower extremities that started off as blisters which denuded. He has areas on the left lateral calf and 2 on the right lateral. He has an area on the left first met head which he did not know was there he we identified this on intake. He has been using Silvadene cream provided by his primary care physician but he is complaining that this burns. 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 He was acutely ill at that time complicating GI bleed. ABIs in our clinic were 1.16 on the right and 1.13 on the left 2/25; the patient comes in with his areas on the left lateral and right lateral calf. There is also an area over the left first MTP bunion deformity. We have been using Sorbact. His edema control is fairly good 3/4; left lateral and right lateral calf. Most of his wounds are in the same position tightly adherent nonviable debris. On the right we debrided the superior wound on the left both wounds. The area on his bunion over the left first MTP medially is I think just about closed. We have been using Sorbact without a lot of success changed to Iodoflex under compression 3/11; left lateral and right lateral calf perhaps minor improvement in the surface condition. We have been using Iodoflex. The area over the bunion of the left first MTP has closed over 3/18; left lateral and right lateral calf not much improvement. We have been using Iodoflex. Aggressive debridement last week. 3/25; left lateral and right lateral calf. We have been using Iodoflex. There is some improvement in the superior area on the right and 2 on the lateral left although there is still a lot of debris on the surface. The inferior area on the right is still a completely nonviable surface. 4/8; left lateral and right lateral calfs. Essentially mirror-image looking wounds 2 wounds on each side in close juxtaposition we have been using Iodoflex with some improvement in the very adherent fibrinous debris but not a lot. The patient has arterial studies next Wednesday morning and venous studies next Thursday morning. I have been avoiding any further aggressive  debridement until we see the arterial study results. His ABIs were fairly good  in this clinic and is dorsalis pedis pulses are palpable but the wound beds are pale. Objective Constitutional Vitals Time Taken: 8:50 AM, Height: 71 in, Weight: 198 lbs, BMI: 27.6, Temperature: 97.8 F, Pulse: 79 bpm, Respiratory Rate: 20 breaths/min, Blood Pressure: 116/71 mmHg. Cardiovascular Dorsalis pedis pulses are palpable. Some edema bilaterally. Nonpitting. General Notes: Wound exam; the patient has essentially mirror-image wounds on the right lateral and left lateral calf mid level. 2 wounds on each side. The worst of these is on the lower part of the right. Again very fibrinous debris which is very difficult to debride. I did not do this today washed him off with wound cleanser and gauze. Integumentary (Hair, Skin) Probably some degree of bilateral lymphedema. Wound #1 status is Open. Original cause of wound was Blister. The wound is located on the Left,Lateral Lower Leg. The wound measures 4.4cm length x 4.3cm width x 0.2cm depth; 14.86cm^2 area and 2.972cm^3 volume. There is Fat Layer (Subcutaneous Tissue) Exposed exposed. There is no tunneling or undermining noted. There is a medium amount of serosanguineous drainage noted. The wound margin is distinct with the outline attached to the wound base. There is small (1-33%) pink granulation within the wound bed. There is a large (67-100%) amount of necrotic tissue within the wound bed including Adherent Slough. Wound #3 status is Open. Original cause of wound was Blister. The wound is located on the Right,Proximal,Lateral Lower Leg. The wound measures 2.9cm length x 2.3cm width x 0.4cm depth; 5.239cm^2 area and 2.095cm^3 volume. There is Fat Layer (Subcutaneous Tissue) Exposed exposed. There is no tunneling or undermining noted. There is a medium amount of serosanguineous drainage noted. The wound margin is well defined and not attached to the wound base.  There is small (1-33%) pink granulation within the wound bed. There is a large (67-100%) amount of necrotic tissue within the wound bed including Adherent Slough. Wound #4 status is Open. Original cause of wound was Blister. The wound is located on the Right,Distal,Lateral Lower Leg. The wound measures 2.5cm length x 2.5cm width x 0.2cm depth; 4.909cm^2 area and 0.982cm^3 volume. There is Fat Layer (Subcutaneous Tissue) Exposed exposed. There is no tunneling or undermining noted. There is a medium amount of serosanguineous drainage noted. The wound margin is distinct with the outline attached to the wound base. There is small (1-33%) pink granulation within the wound bed. There is a medium (34- 66%) amount of necrotic tissue within the wound bed including Eschar and Adherent Slough. 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 - Thursday. ***Patient to schedule appointment with wound center after vascular appointment.*** Dressing Change Frequency: Other: - change twice a week by home health. Skin Barriers/Peri-Wound Care: Moisturizing lotion TCA Cream or Ointment - liberally in clinic today mixed with lotion. Wound Cleansing: May shower with protection. - use cast protectors on the days dressings are not changed. May shower and wash wound with soap and water. - with dressing changes only. Primary Wound Dressing: Wound #1 Left,Lateral Lower Leg: Iodoflex - if home health does not have iodoflex may use iodosorb ointment. Wound #3 Right,Proximal,Lateral Lower Leg: Iodoflex - if home health  does not have iodoflex may use  iodosorb ointment. Wound #4 Right,Distal,Lateral Lower Leg: Iodoflex - if home health does not have iodoflex may use iodosorb ointment. Secondary Dressing: ABD pad Other: - pad left medial first met head for protection. Edema Control: 4 layer compression - Bilateral - ensure to wrap from foot to just below calf. Avoid standing for long periods of time Elevate legs to the level of the heart or above for 30 minutes daily and/or when sitting, a frequency of: - throughout the day. Home Health: [facility] for wound care. - [facility] 1. Iodoflex to continue 2. As usual we have to carefully plan when we send patients for vascular studies that the external compression part of the dressings are removed otherwise the tecs in the [facility] clinic we will not do this. I want to be able to get the maximum amount of information. 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 3. The arterial part of this is to look at why the wounds look so pale there is also not a lot of bleeding with debridement although his ABIs in this clinic were actually within the normal range as well his dorsalis pedis pulses are certainly palpable I want to make sure that we are not overlooking significant arterial insufficiency. 4. I do not believe there is any active infection here. 5. Not much complained of pain. He has chronic renal insufficiency but I do not believe that this is related to these wounds. So far I have not considered biopsying the Electronic Signature(s) Signed: 12/06/2019 5:58:56 PM By: Linton Ham MD Entered By: Linton Ham on 12/06/2019 09:41:06 -------------------------------------------------------------------------------- SuperBill Details Patient Name: Date of Service: LEEUM, SANKEY 12/06/2019 Medical Record [id number] Patient Account Number: [id number] Date of Birth/Sex: Treating RN: 05/10/1941 (79  y.o. 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 Physician Procedures Electronic Signature(s) Signed: 12/06/2019 5:58:56 PM By: Linton Ham MD Signed: 12/06/2019 6:08:37 PM By: Deon Pilling Entered By: Deon Pilling on 12/06/2019 10:05:54

## 2019-12-10 NOTE — Progress Notes (Signed)
[name], [name] ([id number]) Visit Report for 12/06/2019 Arrival Information Details Patient Name: Date of Service: [name], [name] 12/06/2019 8:30 AM Medical Record [id number] Patient Account Number: [id number] Date of Birth/Sex: Treating RN: 09-10-1940 (79 y.o. [name] Primary Care Jahnaya Branscome: Glennon Mac., ROBERT Other Clinician: Referring Rickey Farrier: Treating Zhi Geier/Extender:Robson, Pamella Pert., Tacy Learn in Treatment: 8 Visit Information History Since Last Visit Added or deleted any medications: No Patient Arrived: [name] Any new allergies or adverse reactions: No Arrival Time: 08:54 Had a fall or experienced change in No Accompanied By: self activities of daily living that may affect Transfer Assistance: None risk of falls: Patient Identification Verified: Yes Signs or symptoms of abuse/neglect since last No Secondary Verification Process Yes visito Completed: Hospitalized since last visit: No Patient Requires Transmission- No Implantable device outside of the clinic excluding No Based Precautions: cellular tissue based products placed in the center Patient Has Alerts: Yes since last visit: Patient Alerts: Patient on Blood Has Dressing in Place as Prescribed: Yes Thinner Has Compression in Place as Prescribed: Yes Pain Present Now: Yes Electronic Signature(s) Signed: 12/06/2019 5:43:40 PM By: Kela Millin Entered By: Kela Millin on 12/06/2019 08:54:43 -------------------------------------------------------------------------------- Compression Therapy Details Patient Name: Date of Service: [name] 12/06/2019 8:30 AM Medical Record [id number] Patient Account Number: [id number] Date of Birth/Sex: Treating RN: Feb 10, 1941 (79 y.o. [name] Primary Care Esteen Delpriore: Glennon Mac., ROBERT Other Clinician: Referring Sharne Linders: Treating Geriann Lafont/Extender:Robson, Pamella Pert., ROBERT Weeks in Treatment:  8 Compression Therapy Performed for Wound Wound #1 Left,Lateral Lower Leg Assessment: Performed By: Clinician Levan Hurst, RN Compression Type: Four Layer Post Procedure Diagnosis Same as Pre-procedure Electronic Signature(s) Signed: 12/06/2019 6:08:37 PM By: Deon Pilling Entered By: Deon Pilling on 12/06/2019 10:05:42 -------------------------------------------------------------------------------- Compression Therapy Details Patient Name: Date of Service: [name], [name] 12/06/2019 8:30 AM Medical Record [id number] Patient Account Number: [id number] Date of Birth/Sex: Treating RN: [birth year] (79 y.o. [name] Primary Care Arryana Tolleson: Glennon Mac., ROBERT Other Clinician: Referring Nohealani Medinger: Treating [name]/Extender:Robson, Pamella Pert., ROBERT Weeks in Treatment: 8 Compression Therapy Performed for Wound Wound #3 Right,Proximal,Lateral Lower Leg Assessment: Performed By: Clinician Levan Hurst, RN Compression Type: Four Layer Post Procedure Diagnosis Same as Pre-procedure Electronic Signature(s) Signed: 12/06/2019 6:08:37 PM By: Deon Pilling Entered By: Deon Pilling on 12/06/2019 10:05:43 -------------------------------------------------------------------------------- Compression Therapy Details Patient Name: Date of Service: [name], [name] 12/06/2019 8:30 AM Medical Record [id number] Patient Account Number: [id number] Date of Birth/Sex: Treating RN: [birth year] (79 y.o. [name] Primary Care [name]: Glennon Mac., ROBERT Other Clinician: Referring [name]: Treating [name]/Extender:Robson, Pamella Pert., ROBERT Weeks in Treatment: 8 Compression Therapy Performed for Wound Wound #4 Right,Distal,Lateral Lower Leg Assessment: Performed By: Clinician Levan Hurst, RN Compression Type: Four Layer Post Procedure Diagnosis Same as Pre-procedure Electronic Signature(s) Signed: 12/06/2019 6:08:37 PM By: Deon Pilling Entered By:  Deon Pilling on 12/06/2019 10:05:43 -------------------------------------------------------------------------------- Encounter Discharge Information Details Patient Name: Date of Service: [name], [name] 12/06/2019 8:30 AM Medical Record [id number] Patient Account Number: [id number] Date of Birth/Sex: Treating RN: Aug 07, 1941 (79 y.o. [name] Primary Care [name]: Glennon Mac., ROBERT Other Clinician: Referring [name]: Treating Takao Lizer/Extender:Robson, Pamella Pert., Tacy Learn in Treatment: 8 Encounter Discharge Information Items Discharge Condition: Stable Ambulatory Status: Cane Discharge Destination: Home Transportation: Private Auto Accompanied By: alone Schedule Follow-up Appointment: Yes Clinical Summary of Care: Patient Declined Electronic Signature(s) Signed: 12/10/2019 6:12:10 PM By: Levan Hurst RN, BSN Entered By: Levan Hurst on 12/06/2019 10:40:16 -------------------------------------------------------------------------------- Lower Extremity Assessment Details Patient Name: Date of Service: [name] 12/06/2019 8:30  AM Medical Record [id number] Patient Account Number: [id number] Date of Birth/Sex: Treating RN: 04-25-1941 (79 y.o. [name] Primary Care [name]: Glennon Mac., ROBERT Other Clinician: Referring [name]: Treating [name]/Extender:Robson, Pamella Pert., ROBERT Weeks in Treatment: 8 Edema Assessment Assessed: [Left: No] [Right: No] Edema: [Left: Yes] [Right: Yes] Calf Left: Right: Point of Measurement: 43 cm From Medial Instep 39 cm 38.5 cm Ankle Left: Right: Point of Measurement: 14 cm From Medial Instep 23 cm 24.5 cm Vascular Assessment Pulses: Dorsalis Pedis Palpable: [Left:No] [Right:No] Electronic Signature(s) Signed: 12/06/2019 5:43:40 PM By: Kela Millin Entered By: Kela Millin on 12/06/2019  08:59:17 -------------------------------------------------------------------------------- Multi Wound Chart Details Patient Name: Date of Service: [name], [name] 12/06/2019 8:30 AM Medical Record [id number] Patient Account Number: [id number] Date of Birth/Sex: Treating RN: 01/13/1941 (79 y.o. [name] Primary Care Reily Ilic: Glennon Mac., ROBERT Other Clinician: Referring Jawad Wiacek: Treating Teresita Fanton/Extender:Robson, Pamella Pert., ROBERT Weeks in Treatment: 8 Vital Signs Height(in): 71 Pulse(bpm): 79 Weight(lbs): 198 Blood Pressure(mmHg): 116/71 Body Mass Index(BMI): 28 Temperature(F): 97.8 Respiratory 20 Rate(breaths/min): Photos: [1:No Photos] [3:No Photos] [4:No Photos] Wound Location: [1:Left, Lateral Lower Leg] [3:Right, Proximal, Lateral Lower Leg] [4:Right, Distal, Lateral Lower Leg] Wounding Event: [1:Blister] [3:Blister] [4:Blister] Primary Etiology: [1:Venous Leg Ulcer] [3:Venous Leg Ulcer] [4:Venous Leg Ulcer] Comorbid History: [1:Anemia, Congestive Heart Anemia, Congestive Heart Anemia, Congestive Heart Failure, Hypertension, Peripheral Venous Disease, Peripheral Venous Disease, Peripheral Venous Disease, 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 End  Stage Renal Disease] [3:Failure, Hypertension,] [4:Failure, Hypertension,] Date Acquired: [1:09/11/2019] [3:09/11/2019] [4:09/11/2019] Weeks of Treatment: [1:8] [3:8] [4:8] Wound Status: [1:Open] [3:Open] [4:Open] Clustered Wound: [1:Yes] [3:No] [4:No] Clustered Quantity: [1:2] [3:N/A] [4:N/A] Measurements L x W x D 4.4x4.3x0.2 [3:2.9x2.3x0.4] [4:2.5x2.5x0.2] (cm) Area (cm) : [1:14.86] [3:5.239] [5:8.850] Volume (cm) : [1:2.972] [3:2.095] [4:0.982] % Reduction in Area: [1:74.80%] [3:-5.90%] [4:47.20%] % Reduction in Volume: 49.60% [3:-323.20%] [4:-5.60%] Classification: [1:Full Thickness Without Exposed Support Structures Exposed Support Structures Exposed Support Structures] [3:Full Thickness  Without] [4:Full Thickness Without] Exudate Amount: [1:Medium] [3:Medium] [4:Medium] Exudate Type: [1:Serosanguineous] [3:Serosanguineous] [4:Serosanguineous] Exudate Color: [1:red, brown] [3:red, brown] [4:red, brown] Wound Margin: [1:Distinct, outline attached Well defined, not attached Distinct, outline attached] Granulation Amount: [1:Small (1-33%)] [3:Small (1-33%)] [4:Small (1-33%)] Granulation Quality: [1:Pink] [3:Pink] [4:Pink] Necrotic Amount: [1:Large (67-100%)] [3:Large (67-100%)] [4:Medium (34-66%)] Necrotic Tissue: [1:Adherent Slough] [3:Adherent Slough] [4:Eschar, Adherent Slough] Exposed Structures: [1:Fat Layer (Subcutaneous Fat Layer (Subcutaneous Fat Layer (Subcutaneous Tissue) Exposed: Yes Fascia: No Tendon: No Muscle: No Joint: No Bone: No Small (1-33%)] [3:Tissue) Exposed: Yes Fascia: No Tendon: No Muscle: No Joint: No Bone: No Small (1-33%)]  [4:Tissue) Exposed: Yes Fascia: No Tendon: No Muscle: No Joint: No Bone: No Small (1-33%)] Treatment Notes Electronic Signature(s) Signed: 12/06/2019 5:58:56 PM By: Linton Ham MD Signed: 12/06/2019 6:08:37 PM By: Deon Pilling Entered By: Linton Ham on 12/06/2019 09:35:01 -------------------------------------------------------------------------------- Multi-Disciplinary Care Plan Details Patient Name: Date of Service: [name], [name] 12/06/2019 8:30 AM Medical Record [id number] Patient Account Number: [id number] Date of Birth/Sex: Treating RN: 15-Jul-1941 (79 y.o. [name] Primary Care Pebble Botkin: Glennon Mac., ROBERT Other Clinician: Referring Dayon Witt: Treating Korver Graybeal/Extender:Robson, Pamella Pert., ROBERT Weeks in Treatment: 8 Active Inactive Abuse / Safety / Falls / Self Care Management Nursing Diagnoses: Potential for falls Goals: Patient will remain injury free related to falls Date Initiated: 10/11/2019 Target Resolution Date: 12/28/2019 Goal Status: Active Patient/caregiver will verbalize  understanding of the importance to maintain current immunizations/vaccinations Date Initiated: 10/11/2019 Date Inactivated: 11/08/2019 Target Resolution Date: 12/06/2019 Goal Status: Met Interventions: Assess self care needs  on admission and as needed Provide education on fall prevention Treatment Activities: Patient referred to home care : 10/11/2019 Notes: Nutrition Nursing Diagnoses: Potential for alteratiion in Nutrition/Potential for imbalanced nutrition Goals: Patient/caregiver agrees to and verbalizes understanding of need to obtain nutritional consultation Date Initiated: 10/11/2019 Date Inactivated: 11/01/2019 Target Resolution Date: 11/09/2019 Goal Status: Met Patient/caregiver agrees to and verbalizes understanding of need to use nutritional supplements and/or vitamins as prescribed Date Initiated: 10/11/2019 Target Resolution Date: 12/28/2019 Goal Status: Active Interventions: Assess HgA1c results as ordered upon admission and as needed Provide education on nutrition Treatment Activities: Education provided on Nutrition : 11/08/2019 Obtain HgA1c : 10/11/2019 Patient referred to Primary Care Physician for further nutritional evaluation : 10/11/2019 Notes: Electronic Signature(s) Signed: 12/06/2019 6:08:37 PM By: Deon Pilling Entered By: Deon Pilling on 12/06/2019 08:15:20 -------------------------------------------------------------------------------- Pain Assessment Details Patient Name: Date of Service: [name], [name] 12/06/2019 8:30 AM Medical Record [id number] Patient Account Number: [id number] Date of Birth/Sex: Treating RN: 06/04/1941 (79 y.o. [name] Primary Care Velicia Dejager: Glennon Mac., ROBERT Other Clinician: Referring Isaih Bulger: Treating Esti Demello/Extender:Robson, Pamella Pert., ROBERT Weeks in Treatment: 8 Active Problems Location of Pain Severity and Description of Pain Patient Has Paino Yes Site Locations Site Locations Pain  Location: Generalized Pain, Pain in Ulcers With Dressing Change: Yes Duration of the Pain. Constant / Intermittento Constant Rate the pain. Current Pain Level: 6 Worst Pain Level: 10 Least Pain Level: 5 Tolerable Pain Level: 5 Character of Pain Describe the Pain: Aching, Burning, Shooting Pain Management and Medication Current Pain Management: Electronic Signature(s) Signed: 12/06/2019 5:43:40 PM By: Kela Millin Entered By: Kela Millin on 12/06/2019 08:55:26 -------------------------------------------------------------------------------- Patient/Caregiver Education Details Patient Name: Date of Service: [name] 4/8/2021andnbsp8:30 AM Medical Record [id number] Patient Account Number: [id number] Date of Birth/Gender: Treating RN: 12/12/1940 (79 y.o. [name] Primary Care Physician: Glennon Mac., ROBERT Other Clinician: Referring Physician: Treating Physician/Extender:Robson, Pamella Pert., Tacy Learn in Treatment: 8 Education Assessment Education Provided To: Patient Education Topics Provided Nutrition: Handouts: Nutrition Methods: Explain/Verbal Responses: Reinforcements needed Electronic Signature(s) Signed: 12/06/2019 6:08:37 PM By: Deon Pilling Entered By: Deon Pilling on 12/06/2019 08:15:33 -------------------------------------------------------------------------------- Wound Assessment Details Patient Name: Date of Service: [name], [name] 12/06/2019 8:30 AM Medical Record [id number] Patient Account Number: [id number] Date of Birth/Sex: Treating RN: Oct 08, 1940 (78 y.o. [name] Primary Care Renne Platts: Glennon Mac., ROBERT Other Clinician: Referring Morgann Woodburn: Treating Keaun Schnabel/Extender:Robson, Pamella Pert., ROBERT Weeks in Treatment: 8 Wound Status Wound Number: 1 Primary Venous Leg Ulcer Etiology: Wound Location: Left, Lateral Lower Leg Wound Open Wounding Event: Blister Status: Date Acquired:  09/11/2019 Comorbid Anemia, Congestive Heart Failure, Weeks Of Treatment: 8 History: Hypertension, Peripheral Venous Disease, Clustered Wound: Yes End Stage Renal Disease Wound Measurements Length: (cm) 4.4 % Reduct Width: (cm) 4.3 % Reduct Depth: (cm) 0.2 Epitheli Clustered Quantity: 2 Tunnelin Area: (cm) 14.86 Undermi Volume: (cm) 2.972 Wound Description Full Thickness Without Exposed Support Foul Odo Classification: Structures Slough/F Wound Distinct, outline attached Margin: Exudate Medium Amount: Exudate Serosanguineous Type: Exudate red, brown Color: Wound Bed Granulation Amount: Small (1-33%) Granulation Quality: Pink Fascia E Necrotic Amount: Large (67-100%) Fat Laye Necrotic Quality: Adherent Slough Tendon E Muscle E Joint Ex Bone Exp r After Cleansing: No ibrino Yes Exposed Structure xposed: No r (Subcutaneous Tissue) Exposed: Yes xposed: No xposed: No posed: No osed: No ion in Area: 74.8% ion in Volume: 49.6% alization: Small (1-33%) g: No ning: No Treatment Notes Wound #1 (Left, Lateral Lower Leg) 1. Cleanse With Soap and water 2. Periwound  Care Moisturizing lotion TCA Ointment 3. Primary Dressing Applied Iodoflex 4. Secondary Dressing ABD Pad 6. Support Layer Applied 4 layer compression Water quality scientist) Signed: 12/06/2019 5:43:40 PM By: Kela Millin Entered By: Kela Millin on 12/06/2019 09:02:45 -------------------------------------------------------------------------------- Wound Assessment Details Patient Name: Date of Service: [name], [name] 12/06/2019 8:30 AM Medical Record [id number] Patient Account Number: [id number] Date of Birth/Sex: Treating RN: 10/13/1940 (79 y.o. [name] Primary Care Shellsea Borunda: Glennon Mac., ROBERT Other Clinician: Referring Mathilda Maguire: Treating Demario Faniel/Extender:Robson, Pamella Pert., ROBERT Weeks in Treatment: 8 Wound Status Wound Number: 3 Primary Venous  Leg Ulcer Etiology: Wound Location: Right, Proximal, Lateral Lower Leg Wound Open Wounding Event: Blister Status: Date Acquired: 09/11/2019 Comorbid Anemia, Congestive Heart Failure, Weeks Of Treatment: 8 History: Hypertension, Peripheral Venous Disease, Clustered Wound: No End Stage Renal Disease Wound Measurements Length: (cm) 2.9 % Reduct Width: (cm) 2.3 % Reduct Depth: (cm) 0.4 Epitheli Area: (cm) 5.239 Tunneli Volume: (cm) 2.095 Undermi Wound Description Classification: Full Thickness Without Exposed Support Foul Odo Structures Slough/F Wound Well defined, not attached Margin: Exudate Medium Amount: Exudate Serosanguineous Type: Exudate red, brown Color: Wound Bed Granulation Amount: Small (1-33%) Granulation Quality: Pink Fascia E Necrotic Amount: Large (67-100%) Fat Laye Necrotic Quality: Adherent Slough Tendon E Muscle E Joint Ex Bone Exp r After Cleansing: No ibrino Yes Exposed Structure xposed: No r (Subcutaneous Tissue) Exposed: Yes xposed: No xposed: No posed: No osed: No ion in Area: -5.9% ion in Volume: -323.2% alization: Small (1-33%) ng: No ning: No Treatment Notes Wound #3 (Right, Proximal, Lateral Lower Leg) 1. Cleanse With Soap and water 2. Periwound Care Moisturizing lotion TCA Ointment 3. Primary Dressing Applied Iodoflex 4. Secondary Dressing ABD Pad 6. Support Layer Applied 4 layer compression Water quality scientist) Signed: 12/06/2019 5:43:40 PM By: Kela Millin Entered By: Kela Millin on 12/06/2019 09:02:59 -------------------------------------------------------------------------------- Wound Assessment Details Patient Name: Date of Service: [name], [name] 12/06/2019 8:30 AM Medical Record [id number] Patient Account Number: [id number] Date of Birth/Sex: Treating RN: Nov 10, 1940 (79 y.o. [name] Primary Care Deanglo Hissong: Glennon Mac., ROBERT Other Clinician: Referring Toriana Sponsel: Treating  Jahmeer Porche/Extender:Robson, Pamella Pert., ROBERT Weeks in Treatment: 8 Wound Status Wound Number: 4 Primary Venous Leg Ulcer Etiology: Wound Location: Right, Distal, Lateral Lower Leg Wound Open Wounding Event: Blister Status: Date Acquired: 09/11/2019 Comorbid Anemia, Congestive Heart Failure, Weeks Of Treatment: 8 History: Hypertension, Peripheral Venous Disease, Clustered Wound: No End Stage Renal Disease Wound Measurements Length: (cm) 2.5 % Reduct Width: (cm) 2.5 % Reduct Depth: (cm) 0.2 Epitheli Area: (cm) 4.909 Tunneli Volume: (cm) 0.982 Undermi Wound Description Full Thickness Without Exposed Support Foul Odo Classification: Structures Slough/F Wound Wound Distinct, outline attached Margin: Exudate Medium Amount: Exudate Serosanguineous Type: Exudate red, brown Color: Wound Bed Granulation Amount: Small (1-33%) Granulation Quality: Pink Fascia E Necrotic Amount: Medium (34-66%) Fat Laye Necrotic Quality: Eschar, Adherent Slough Tendon E Muscle E Joint Ex Bone Exp r After Cleansing: No ibrino Yes Exposed Structure xposed: No r (Subcutaneous Tissue) Exposed: Yes xposed: No xposed: No posed: No osed: No ion in Area: 47.2% ion in Volume: -5.6% alization: Small (1-33%) ng: No ning: No Treatment Notes Wound #4 (Right, Distal, Lateral Lower Leg) 1. Cleanse With Soap and water 2. Periwound Care Moisturizing lotion TCA Ointment 3. Primary Dressing Applied Iodoflex 4. Secondary Dressing ABD Pad 6. Support Layer Applied 4 layer compression wrap Electronic Signature(s) Signed: 12/06/2019 5:43:40 PM By: Kela Millin Entered By: Kela Millin on 12/06/2019 09:03:27 -------------------------------------------------------------------------------- Vitals Details Patient Name: Date of Service: [name]  12/06/2019 8:30 AM Medical Record [id number] Patient Account Number: [id number] Date of Birth/Sex: Treating RN: September 15, 1940  (79 y.o. [name] Primary Care Everson Mott: Glennon Mac., ROBERT Other Clinician: Referring Moet Mikulski: Treating Maniyah Moller/Extender:Robson, Pamella Pert., ROBERT Weeks in Treatment: 8 Vital Signs Time Taken: 08:50 Temperature (F): 97.8 Height (in): 71 Pulse (bpm): 79 Weight (lbs): 198 Respiratory Rate (breaths/min): 20 Body Mass Index (BMI): 27.6 Blood Pressure (mmHg): 116/71 Reference Range: 80 - 120 mg / dl Electronic Signature(s) Signed: 12/06/2019 5:43:40 PM By: Kela Millin Entered By: Kela Millin on 12/06/2019 08:55:05

## 2019-12-11 ENCOUNTER — Other Ambulatory Visit (HOSPITAL_COMMUNITY): Payer: Self-pay | Admitting: Internal Medicine

## 2019-12-11 DIAGNOSIS — L97929 Non-pressure chronic ulcer of unspecified part of left lower leg with unspecified severity: Secondary | ICD-10-CM

## 2019-12-11 DIAGNOSIS — L97919 Non-pressure chronic ulcer of unspecified part of right lower leg with unspecified severity: Secondary | ICD-10-CM

## 2019-12-12 ENCOUNTER — Ambulatory Visit (HOSPITAL_COMMUNITY): Payer: Medicare Other

## 2019-12-12 ENCOUNTER — Other Ambulatory Visit: Payer: Self-pay

## 2019-12-12 ENCOUNTER — Ambulatory Visit (HOSPITAL_COMMUNITY)
Admission: RE | Admit: 2019-12-12 | Discharge: 2019-12-12 | Disposition: A | Discharge status: Home / Self Care | Payer: Medicare Other | Source: Ambulatory Visit | Attending: Vascular Surgery | Admitting: Vascular Surgery

## 2019-12-12 ENCOUNTER — Other Ambulatory Visit (HOSPITAL_COMMUNITY): Payer: Self-pay | Admitting: Internal Medicine

## 2019-12-12 DIAGNOSIS — L97929 Non-pressure chronic ulcer of unspecified part of left lower leg with unspecified severity: Secondary | ICD-10-CM | POA: Insufficient documentation

## 2019-12-12 DIAGNOSIS — L97919 Non-pressure chronic ulcer of unspecified part of right lower leg with unspecified severity: Secondary | ICD-10-CM | POA: Insufficient documentation

## 2019-12-12 NOTE — Progress Notes (Signed)
CHAO, BLAZEJEWSKI ([id number]) Visit Report for 11/08/2019 Arrival Information Details Patient Name: Date of Service: [name], [name] 11/08/2019 8:30 AM Medical Record [id number] Patient Account Number: [id number] Date of Birth/Sex: Treating RN: 04-03-1941 (79 y.o. [name] Provider: Glennon Mac., ROBERT Other Clinician: Referring Provider: Treating Provider/Extender:Robson, Pamella Pert., ROBERT Weeks in Treatment: 4 Visit Information History Since Last Visit Added or deleted any medications: No Patient Arrived: Kasandra Patton Any new allergies or adverse reactions: No Arrival Time: 08:15 Had a fall or experienced change in No Accompanied By: son activities of daily living that may affect Transfer Assistance: None risk of falls: Patient Identification Verified: Yes Signs or symptoms of abuse/neglect since last No Secondary Verification Process Yes visito Completed: Hospitalized since last visit: No Patient Requires Transmission- No Implantable device outside of the clinic excluding No Based Precautions: cellular tissue based products placed in the center Patient Has Alerts: Yes since last visit: Patient Alerts: Patient on Blood Has Dressing in Place as Prescribed: Yes Thinner Pain Present Now: No Electronic Signature(s) Signed: 12/12/2019 9:21:08 AM By: Sandre Kitty Entered By: Sandre Kitty on 11/08/2019 08:16:00 -------------------------------------------------------------------------------- Compression Therapy Details Patient Name: Date of Service: Troy, Patton 11/08/2019 8:30 AM Medical Record [id number] Patient Account Number: [id number] Date of Birth/Sex: Treating RN: [birth year] (79 y.o. [name] Provider: Glennon Mac., ROBERT Other Clinician: Referring Provider: Treating Provider/Extender:Robson, Pamella Pert., ROBERT Weeks in Treatment: 4 Compression Therapy Performed for Wound Wound #1  Left,Lateral Lower Leg Assessment: Performed By: Clinician Baruch Gouty, RN Compression Type: Four Layer Pre Treatment ABI: 1.1 Post Procedure Diagnosis Same as Pre-procedure Electronic Signature(s) Signed: 11/08/2019 6:00:33 PM By: Deon Pilling Entered By: Deon Pilling on 11/08/2019 09:05:03 -------------------------------------------------------------------------------- Compression Therapy Details Patient Name: Date of Service: Troy, Patton 11/08/2019 8:30 AM Medical Record [id number] Patient Account Number: [id number] Date of Birth/Sex: Treating RN: 03-Jan-1941 (79 y.o. [name] Provider: Glennon Mac., ROBERT Other Clinician: Referring Provider: Treating Provider/Extender:Robson, Pamella Pert., ROBERT Weeks in Treatment: 4 Compression Therapy Performed for Wound Wound #3 Right,Proximal,Lateral Lower Leg Assessment: Performed By: Clinician Baruch Gouty, RN Compression Type: Four Layer Pre Treatment ABI: 1.1 Post Procedure Diagnosis Same as Pre-procedure Electronic Signature(s) Signed: 11/08/2019 6:00:33 PM By: Deon Pilling Entered By: Deon Pilling on 11/08/2019 09:05:03 -------------------------------------------------------------------------------- Compression Therapy Details Patient Name: Date of Service: Troy, Patton 11/08/2019 8:30 AM Medical Record [id number] Patient Account Number: [id number] Date of Birth/Sex: Treating RN: [birth year] (79 y.o. [name] Provider: Glennon Mac., ROBERT Other Clinician: Referring Provider: Treating Provider/Extender:Robson, Pamella Pert., ROBERT Weeks in Treatment: 4 Compression Therapy Performed for Wound Wound #4 Right,Distal,Lateral Lower Leg Assessment: Performed By: Clinician Baruch Gouty, RN Compression Type: Four Layer Pre Treatment ABI: 1.1 Post Procedure Diagnosis Same as Pre-procedure Electronic Signature(s) Signed: 11/08/2019 6:00:33 PM By:  Deon Pilling Entered By: Deon Pilling on 11/08/2019 09:05:03 -------------------------------------------------------------------------------- Encounter Discharge Information Details Patient Name: Date of Service: Troy, Patton 11/08/2019 8:30 AM Medical Record [id number] Patient Account Number: [id number] Date of Birth/Sex: Treating RN: 1940/11/04 (79 y.o. [name] Primary Care Provider: Glennon Mac., ROBERT Other Clinician: Referring Provider: Treating Provider/Extender:Robson, Pamella Pert., Tacy Learn in Treatment: 4 Encounter Discharge Information Items Post Procedure Vitals Discharge Condition: Stable Temperature (F): 97.6 Ambulatory Status: Cane Pulse (bpm): 90 Discharge Destination: Home Respiratory Rate (breaths/min): 22 Transportation: Other Blood Pressure (mmHg): 134/70 Accompanied By: self Schedule Follow-up Appointment: Yes Clinical Summary of Care: Patient Declined Electronic Signature(s) Signed: 11/08/2019 6:03:12 PM By: Kela Millin Entered By:  Kela Millin on 11/08/2019 29:92:42 -------------------------------------------------------------------------------- Lower Extremity Assessment Details Patient Name: Date of Service: Troy, Patton 11/08/2019 8:30 AM Medical Record [id number] Patient Account Number: [id number] Date of Birth/Sex: Treating RN: 1941/07/16 (79 y.o. [name] Provider: Glennon Mac., ROBERT Other Clinician: Referring Provider: Treating Provider/Extender:Robson, Pamella Pert., ROBERT Weeks in Treatment: 4 Edema Assessment Assessed: [Left: No] [Right: No] Edema: [Left: Yes] [Right: Yes] Calf Left: Right: Point of Measurement: 43 cm From Medial Instep 35.7 cm 36.5 cm Ankle Left: Right: Point of Measurement: 14 cm From Medial Instep 23 cm 22.5 cm Vascular Assessment Pulses: Dorsalis Pedis Palpable: [Left:No] [Right:No] Electronic Signature(s) Signed: 11/12/2019 5:44:18  PM By: Levan Hurst RN, BSN Entered By: Levan Hurst on 11/08/2019 08:45:30 -------------------------------------------------------------------------------- Multi Wound Chart Details Patient Name: Date of Service: Troy, Patton 11/08/2019 8:30 AM Medical Record [id number] Patient Account Number: [id number] Date of Birth/Sex: Treating RN: 16-Oct-1940 (79 y.o. [name] Provider: Glennon Mac., ROBERT Other Clinician: Referring Provider: Treating Provider/Extender:Robson, Pamella Pert., ROBERT Weeks in Treatment: 4 Vital Signs Height(in): 71 Pulse(bpm): 90 Weight(lbs): 198 Blood Pressure(mmHg): 134/70 Body Mass Index(BMI): 28 Temperature(F): 97.6 Respiratory 22 Rate(breaths/min): Photos: [1:No Photos] [3:No Photos] [4:No Photos] Wound Location: [1:Left, Lateral Lower Leg] [3:Right, Proximal, Lateral Lower Leg] [4:Right, Distal, Lateral Lower Leg] Wounding Event: [1:Blister] [3:Blister] [4:Blister] Primary Etiology: [1:Venous Leg Ulcer] [3:Venous Leg Ulcer] [4:Venous Leg Ulcer] Comorbid History: [1:Anemia, Congestive Heart Anemia, Congestive Heart Anemia, Congestive Heart Failure, Hypertension, Peripheral Venous Disease, Peripheral Venous Disease, Peripheral Venous Disease, 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 End  Stage Renal Disease] [3:Failure, Hypertension,] [4:Failure, Hypertension,] Date Acquired: [1:09/11/2019] [3:09/11/2019] [4:09/11/2019] Weeks of Treatment: [1:4] [3:4] [4:4] Wound Status: [1:Open] [3:Open] [4:Open] Clustered Wound: [1:Yes] [3:No] [4:No] Clustered Quantity: [1:4] [3:N/A] [4:N/A] Measurements L x W x D 5.2x4.5x0.1 [3:2.9x2x0.2] [4:2.5x3x0.1] (cm) Area (cm) : [1:18.378] [3:4.555] [4:5.89] Volume (cm) : [1:1.838] [3:0.911] [4:0.589] % Reduction in Area: [1:68.80%] [3:7.90%] [4:36.70%] % Reduction in Volume: [1:68.80%] [3:-84.00%] [4:36.70%] Classification: [1:Full Thickness Without Exposed Support Structures  Exposed Support Structures Exposed Support Structures] [3:Full Thickness Without] [4:Full Thickness Without] Exudate Amount: [1:Medium] [3:Medium] [4:Medium] Exudate Type: [1:Serosanguineous] [3:Serosanguineous] [4:Serosanguineous] Exudate Color: [1:red, brown] [3:red, brown] [4:red, brown] Wound Margin: [1:Distinct, outline attached Distinct, outline attached Distinct, outline attached] Granulation Amount: [1:Small (1-33%)] [3:Small (1-33%)] [4:Small (1-33%)] Granulation Quality: [1:Pink] [3:Pink] [4:Pink] Necrotic Amount: [1:Large (67-100%)] [3:Large (67-100%)] [4:Large (67-100%)] Necrotic Tissue: [1:Adherent Slough] [3:Adherent Slough] [4:Adherent Slough] Exposed Structures: [1:Fat Layer (Subcutaneous Fat Layer (Subcutaneous Fat Layer (Subcutaneous Tissue) Exposed: Yes Fascia: No Tendon: No Muscle: No Joint: No Bone: No] [3:Tissue) Exposed: Yes Fascia: No Tendon: No Muscle: No Joint: No Bone: No] [4:Tissue) Exposed: Yes  Fascia: No Tendon: No Muscle: No Joint: No Bone: No] Epithelialization: [1:Small (1-33%)] [3:Small (1-33%)] [4:Small (1-33%)] Debridement: [1:Debridement - Excisional Debridement - Excisional Debridement - Excisional] Pre-procedure [1:09:00] [3:09:00] [4:09:00] Verification/Time Out Taken: Pain Control: [1:Lidocaine 4% Topical Solution] [3:Lidocaine 4% Topical Solution] [4:Lidocaine 4% Topical Solution] Tissue Debrided: [1:Subcutaneous, Slough] [3:Subcutaneous, Slough] [4:Subcutaneous, Slough] Level: [1:Skin/Subcutaneous Tissue] [3:Skin/Subcutaneous Tissue] [4:Skin/Subcutaneous Tissue] Debridement Area (sq cm):9 [3:5.8] [4:7.5] Instrument: [1:Curette] [3:Curette] [4:Curette] Bleeding: [1:Moderate] [3:Moderate] [4:Moderate] Hemostasis Achieved: [1:Pressure] [3:Pressure] [4:Pressure] Procedural Pain: [1:0] [3:0] [4:0] Post Procedural Pain: [1:0] [3:0] [4:0] Debridement Treatment Procedure was tolerated [3:Procedure was tolerated] [4:Procedure was tolerated] Response:  [1:well] [3:well] [4:well] Post Debridement [1:5.2x4.5x0.1] [3:2.9x2x0.2] [4:2.5x3x0.1] Measurements L x W x D (cm) Post Debridement [1:1.838] [3:0.911] [4:0.589] Volume: (cm) Procedures Performed: Compression Therapy [1:Debridement 5] [3:Compression Therapy Debridement] [4:Compression Therapy Debridement N/A  N/A] Photos: [1:No Photos] [3:N/A] [4:N/A] Wound Location: [1:Left, Medial Metatarsal headN/A first] [4:N/A] Wounding Event: [1:Not Known] [3:N/A] [4:N/A] Primary Etiology: [1:Trauma, Other] [3:N/A] [4:N/A] Comorbid History: [1:Anemia, Congestive Heart N/A Failure, Hypertension, Peripheral Venous Disease, End Stage Renal Disease] [4:N/A] Date Acquired: [1:10/11/2019] [3:N/A] [4:N/A] Weeks of Treatment: [1:4] [3:N/A] [4:N/A] Wound Status: [1:Healed - Epithelialized] [3:N/A] [4:N/A] Clustered Wound: [1:No] [3:N/A] [4:N/A] Clustered Quantity: [1:N/A] [3:N/A] [4:N/A] Measurements L x W x D 0x0x0 [3:N/A] [4:N/A] (cm) Area (cm) : [1:0] [3:N/A] [4:N/A] Volume (cm) : [1:0] [3:N/A] [4:N/A] % Reduction in Area: [1:100.00%] [3:N/A] [4:N/A] % Reduction in Volume: [1:100.00%] [3:N/A] [4:N/A] Classification: [1:Full Thickness Without Exposed Support Structures] [3:N/A] [4:N/A] Exudate Amount: [1:Small] [3:N/A] [4:N/A] Exudate Type: [1:Serosanguineous] [3:N/A] [4:N/A] Exudate Color: [1:red, brown] [3:N/A] [4:N/A] Wound Margin: [1:Distinct, outline attached N/A] [4:N/A] Granulation Amount: [1:None Present (0%)] [3:N/A] [4:N/A] Granulation Quality: [1:N/A] [3:N/A] [4:N/A] Necrotic Amount: [1:Large (67-100%)] [3:N/A] [4:N/A] Necrotic Tissue: [1:Eschar] [3:N/A] [4:N/A] Exposed Structures: [1:Fat Layer (Subcutaneous N/A Tissue) Exposed: Yes Fascia: No Tendon: No Muscle: No Joint: No Bone: No] [4:N/A] Epithelialization: [1:None] [3:N/A] [4:N/A] Debridement: [1:N/A] [3:N/A] [4:N/A] Pain Control: [1:N/A] [3:N/A] [4:N/A] Tissue Debrided: [1:N/A] [3:N/A] [4:N/A] Level: [1:N/A] [3:N/A]  [4:N/A] Debridement Area (sq cm):N/A [3:N/A] [4:N/A] Instrument: [1:N/A] [3:N/A] [4:N/A] Bleeding: [1:N/A] [3:N/A] [4:N/A] Hemostasis Achieved: [1:N/A] [3:N/A] [4:N/A] Procedural Pain: [1:N/A] [3:N/A] [4:N/A] Post Procedural Pain: [1:N/A] [3:N/A] [4:N/A] Debridement Treatment N/A [3:N/A] [4:N/A] Response: Post Debridement [1:N/A] [3:N/A] [4:N/A] Measurements L x W x D (cm) Post Debridement [1:N/A] [3:N/A] [4:N/A] Volume: (cm) Procedures Performed: N/A [3:N/A] [4:N/A] Treatment Notes Electronic Signature(s) Signed: 11/08/2019 6:00:33 PM By: Deon Pilling Signed: 11/10/2019 7:03:15 AM By: Linton Ham MD Entered By: Linton Ham on 11/08/2019 09:10:26 -------------------------------------------------------------------------------- Multi-Disciplinary Care Plan Details Patient Name: Date of Service: CAYLEN, YARDLEY 11/08/2019 8:30 AM Medical Record [id number] Patient Account Number: [id number] Date of Birth/Sex: Treating RN: 1941-08-11 (79 y.o. [name] Provider: Glennon Mac., ROBERT Other Clinician: Referring Provider: Treating Provider/Extender:Robson, Pamella Pert., ROBERT Weeks in Treatment: 4 Active Inactive Abuse / Safety / Falls / Self Care Management Nursing Diagnoses: Potential for falls Goals: Patient will remain injury free related to falls Date Initiated: 10/11/2019 Target Resolution Date: 11/29/2019 Goal Status: Active Patient/caregiver will verbalize understanding of the importance to maintain current immunizations/vaccinations Date Initiated: 10/11/2019 Date Inactivated: 11/08/2019 Target Resolution Date: 12/06/2019 Goal Status: Met Interventions: Assess self care needs on admission and as needed Provide education on fall prevention Treatment Activities: Patient referred to home care : 10/11/2019 Notes: Nutrition Nursing Diagnoses: Potential for alteratiion in Nutrition/Potential for imbalanced  nutrition Goals: Patient/caregiver agrees to and verbalizes understanding of need to obtain nutritional consultation Date Initiated: 10/11/2019 Date Inactivated: 11/01/2019 Target Resolution Date: 11/09/2019 Goal Status: Met Patient/caregiver agrees to and verbalizes understanding of need to use nutritional supplements and/or vitamins as prescribed Date Initiated: 10/11/2019 Target Resolution Date: 11/30/2019 Goal Status: Active Interventions: Assess HgA1c results as ordered upon admission and as needed Provide education on nutrition Treatment Activities: Education provided on Nutrition : 11/01/2019 Obtain HgA1c : 10/11/2019 Patient referred to Primary Care Physician for further nutritional evaluation : 10/11/2019 Notes: Electronic Signature(s) Signed: 11/08/2019 6:00:33 PM By: Deon Pilling Entered By: Deon Pilling on 11/08/2019 08:20:28 -------------------------------------------------------------------------------- Pain Assessment Details Patient Name: Date of Service: JOBANY, MONTELLANO 11/08/2019 8:30 AM Medical Record [id number] Patient Account Number: [id number] Date of Birth/Sex: Treating RN: 09-Apr-1941 (79 y.o. [name] Provider: Glennon Mac., ROBERT Other Clinician: Referring Provider: Treating Provider/Extender:Robson, Pamella Pert., ROBERT Weeks in Treatment: 4 Active  Problems Location of Pain Severity and Description of Pain Patient Has Paino No Site Locations Pain Management and Medication Current Pain Management: Electronic Signature(s) Signed: 11/08/2019 6:00:33 PM By: Deon Pilling Signed: 12/12/2019 9:21:08 AM By: Sandre Kitty Entered By: Sandre Kitty on 11/08/2019 08:16:36 -------------------------------------------------------------------------------- Patient/Caregiver Education Details Patient Name: Date of Service: HRIDAAN, BOUSE 3/11/2021andnbsp8:30 AM Medical Record [phone] Patient Account Number:  [id number] Date of Birth/Gender: Treating RN: 1941-01-22 (79 y.o. [name] Physician: Glennon Mac., ROBERT Other Clinician: Referring Physician: Treating Physician/Extender:Robson, Pamella Pert., Tacy Learn in Treatment: 4 Education Assessment Education Provided To: Patient Education Topics Provided Nutrition: Handouts: Elevated Blood Sugars: How Do They Affect Wound Healing Methods: Explain/Verbal Responses: Reinforcements needed Electronic Signature(s) Signed: 11/08/2019 6:00:33 PM By: Deon Pilling Entered By: Deon Pilling on 11/08/2019 08:20:52 -------------------------------------------------------------------------------- Wound Assessment Details Patient Name: Date of Service: APRIL, CARLYON 11/08/2019 8:30 AM Medical Record [id number] Patient Account Number: [id number] Date of Birth/Sex: Treating RN: May 11, 1941 (79 y.o. [name] Provider: Glennon Mac., ROBERT Other Clinician: Referring Provider: Treating Provider/Extender:Robson, Pamella Pert., ROBERT Weeks in Treatment: 4 Wound Status Wound Number: 1 Primary Venous Leg Ulcer Etiology: Wound Location: Left Lower Leg - Lateral Wound Open Wounding Event: Blister Status: Date Acquired: 09/11/2019 Comorbid Anemia, Congestive Heart Failure, Weeks Of Treatment: 4 History: Hypertension, Peripheral Venous Disease, Clustered Wound: Yes End Stage Renal Disease Photos Wound Measurements Length: (cm) 5.2 % Reductio Width: (cm) 4.5 % Reductio Depth: (cm) 0.1 Epithelial Clustered Quantity: 4 Tunneling: Area: (cm) 18.378 Undermini Volume: (cm) 1.838 Wound Description Classification: Full Thickness Without Exposed Support Foul Od Structures Slough/ Wound Distinct, outline attached Margin: Exudate Medium Amount: Exudate Serosanguineous Type: Exudate red, brown Color: Wound Bed Granulation Amount: Small (1-33%) Granulation Quality: Pink Fascia Necrotic  Amount: Large (67-100%) Fat Lay Necrotic Quality: Adherent Slough Tendon Muscle Joint E Bone Ex or After Cleansing: No Fibrino Yes Exposed Structure Exposed: No er (Subcutaneous Tissue) Exposed: Yes Exposed: No Exposed: No xposed: No posed: No n in Area: 68.8% n in Volume: 68.8% ization: Small (1-33%) No ng: No Electronic Signature(s) Signed: 11/09/2019 4:47:30 PM By: Mikeal Hawthorne EMT/HBOT Signed: 11/09/2019 5:45:00 PM By: Deon Pilling Previous Signature: 11/08/2019 6:00:33 PM Version By: Deon Pilling Entered By: Mikeal Hawthorne on 11/09/2019 13:39:06 -------------------------------------------------------------------------------- Wound Assessment Details Patient Name: Date of Service: DAJUAN, TURNLEY 11/08/2019 8:30 AM Medical Record [id number] Patient Account Number: [id number] Date of Birth/Sex: Treating RN: 07/19/1941 (79 y.o. [name] Provider: Glennon Mac., ROBERT Other Clinician: Referring Provider: Treating Provider/Extender:Robson, Pamella Pert., ROBERT Weeks in Treatment: 4 Wound Status Wound Number: 3 Primary Venous Leg Ulcer Etiology: Wound Location: Right Lower Leg - Lateral, Proximal Wound Open Wounding Event: Blister Status: Date Acquired: 09/11/2019 Comorbid Anemia, Congestive Heart Failure, Weeks Of Treatment: 4 History: Hypertension, Peripheral Venous Disease, Clustered Wound: No End Stage Renal Disease Photos Wound Measurements Length: (cm) 2.9 % Reduct Width: (cm) 2 % Reduct Depth: (cm) 0.2 Epitheli Area: (cm) 4.555 Tunneli Volume: (cm) 0.911 Undermi Wound Description Full Thickness Without Exposed Support Foul Odo Classification: Structures Slough/F Wound Distinct, outline attached Margin: Exudate Medium Amount: Exudate Serosanguineous Type: Exudate red, brown Color: Wound Bed Granulation Amount: Small (1-33%) Granulation Quality: Pink Fascia E Necrotic Amount: Large (67-100%) Fat  Laye Necrotic Quality: Adherent Slough Tendon E Muscle E Joint Ex Bone Exp r After Cleansing: No ibrino Yes Exposed Structure xposed: No r (Subcutaneous Tissue) Exposed: Yes xposed: No xposed: No posed: No osed: No ion in Area: 7.9% ion in  Volume: -84% alization: Small (1-33%) ng: No ning: No Electronic Signature(s) Signed: 11/09/2019 4:47:30 PM By: Mikeal Hawthorne EMT/HBOT Signed: 11/09/2019 5:45:00 PM By: Deon Pilling Previous Signature: 11/08/2019 6:00:33 PM Version By: Deon Pilling Entered By: Mikeal Hawthorne on 11/09/2019 13:37:51 -------------------------------------------------------------------------------- Wound Assessment Details Patient Name: Date of Service: CEBERT, DETTMANN 11/08/2019 8:30 AM Medical Record [id number] Patient Account Number: [id number] Date of Birth/Sex: Treating RN: 1940-12-30 (80 y.o. [name] Provider: Glennon Mac., ROBERT Other Clinician: Referring Provider: Treating Provider/Extender:Robson, Pamella Pert., ROBERT Weeks in Treatment: 4 Wound Status Wound Number: 4 Primary Venous Leg Ulcer Etiology: Wound Location: Right Lower Leg - Lateral, Distal Wound Open Wounding Event: Blister Status: Date Acquired: 09/11/2019 Comorbid Anemia, Congestive Heart Failure, Weeks Of Treatment: 4 History: Hypertension, Peripheral Venous Disease, Clustered Wound: No End Stage Renal Disease Photos Wound Measurements Length: (cm) 2.5 % Reduct Width: (cm) 3 % Reduct Depth: (cm) 0.1 Epitheli Area: (cm) 5.89 Tunneli Volume: (cm) 0.589 Undermi Wound Description Classification: Full Thickness Without Exposed Support Foul Odo Structures Slough/F Wound Distinct, outline attached Margin: Exudate Medium Amount: Exudate Serosanguineous Type: Exudate red, brown Color: Wound Bed Granulation Amount: Small (1-33%) Granulation Quality: Pink Fascia E Necrotic Amount: Large (67-100%) Fat Laye Necrotic Quality: Adherent  Slough Tendon E Muscle E Joint Ex Bone Exp r After Cleansing: No ibrino Yes Exposed Structure xposed: No r (Subcutaneous Tissue) Exposed: Yes xposed: No xposed: No posed: No osed: No ion in Area: 36.7% ion in Volume: 36.7% alization: Small (1-33%) ng: No ning: No Electronic Signature(s) Signed: 11/09/2019 4:47:30 PM By: Mikeal Hawthorne EMT/HBOT Signed: 11/09/2019 5:45:00 PM By: Deon Pilling Previous Signature: 11/08/2019 6:00:33 PM Version By: Deon Pilling Entered By: Mikeal Hawthorne on 11/09/2019 13:38:11 -------------------------------------------------------------------------------- Wound Assessment Details Patient Name: Date of Service: TREVONE, PRESTWOOD 11/08/2019 8:30 AM Medical Record [id number] Patient Account Number: [id number] Date of Birth/Sex: Treating RN: 11-Jun-1941 (79 y.o. [name] Provider: Glennon Mac., ROBERT Other Clinician: Referring Provider: Treating Provider/Extender:Robson, Pamella Pert., ROBERT Weeks in Treatment: 4 Wound Status Wound Number: 5 Primary Trauma, Other Etiology: Wound Location: Left Metatarsal head first - Medial Wound Healed - Epithelialized Wounding Event: Not Known Status: Date Acquired: 10/11/2019 Comorbid Anemia, Congestive Heart Failure, Weeks Of Treatment: 4 History: Hypertension, Peripheral Venous Disease, Clustered Wound: No End Stage Renal Disease Photos Wound Measurements Length: (cm) 0 % Reduc Width: (cm) 0 % Reduc Depth: (cm) 0 Epithel Area: (cm) 0 Tunnel Volume: (cm) 0 Underm Wound Description Classification: Full Thickness Without Exposed Support Foul Od Structures Slough/ Wound Distinct, outline attached Margin: Exudate Small Amount: Exudate Serosanguineous Type: Exudate red, brown Color: Wound Bed Granulation Amount: None Present (0%) Necrotic Amount: Large (67-100%) Fascia Necrotic Quality: Eschar Fat Lay Tendon Muscle Joint E Bone Ex Electronic  Signature(s) Signed: 11/09/2019 4:47:30 PM By: Mikeal Hawthorne EMT/HBOT Signed: 11/09/2019 5:45:00 PM By: Deon Pilling Previous Signature: 11/08/2019 6:00:33 PM Version By: Lady Deutscher Entered By: Mikeal Hawthorne on 03/12 or After Cleansing: No Fibrino Yes Exposed Structure Exposed: No er (Subcutaneous Tissue) Exposed: Yes Exposed: No Exposed: No xposed: No posed: No i /2021 13:38:49 tion in Area: 100% tion in Volume: 100% ialization: None ing: No ining: No -------------------------------------------------------------------------------- Vitals Details Patient Name: Date of Service: TORIBIO, SEIBER 11/08/2019 8:30 AM Medical Record [id number] Patient Account Number: [id number] Date of Birth/Sex: Treating RN: Feb 14, 1941 (79 y.o. [name] Provider: Glennon Mac., ROBERT Other Clinician: Referring Provider: Treating Provider/Extender:Robson, Pamella Pert., ROBERT Weeks in Treatment: 4 Vital Signs Time Taken:  08:16 Temperature (F): 97.6 Height (in): 71 Pulse (bpm): 90 Weight (lbs): 198 Respiratory Rate (breaths/min): 22 Body Mass Index (BMI): 27.6 Blood Pressure (mmHg): 134/70 Reference Range: 80 - 120 mg / dl Electronic Signature(s) Signed: 12/12/2019 9:21:08 AM By: Sandre Kitty Entered By: Sandre Kitty on 11/08/2019 08:16:17

## 2019-12-13 ENCOUNTER — Encounter (HOSPITAL_BASED_OUTPATIENT_CLINIC_OR_DEPARTMENT_OTHER): Payer: Medicare Other | Admitting: Internal Medicine

## 2019-12-13 ENCOUNTER — Ambulatory Visit (HOSPITAL_COMMUNITY)
Admission: RE | Admit: 2019-12-13 | Discharge: 2019-12-13 | Disposition: A | Discharge status: Home / Self Care | Payer: Medicare Other | Source: Ambulatory Visit | Attending: Vascular Surgery | Admitting: Vascular Surgery

## 2019-12-13 DIAGNOSIS — L97929 Non-pressure chronic ulcer of unspecified part of left lower leg with unspecified severity: Secondary | ICD-10-CM

## 2019-12-13 DIAGNOSIS — I87333 Chronic venous hypertension (idiopathic) with ulcer and inflammation of bilateral lower extremity: Secondary | ICD-10-CM | POA: Diagnosis not present

## 2019-12-13 DIAGNOSIS — L97919 Non-pressure chronic ulcer of unspecified part of right lower leg with unspecified severity: Secondary | ICD-10-CM | POA: Diagnosis not present

## 2019-12-15 NOTE — Progress Notes (Signed)
ALADDIN, KOLLMANN ([id number]) Visit Report for 12/13/2019 HPI Details Patient Name: Date of Service: [name], [name] 12/13/2019 10:15 AM Medical Record [id number] Patient Account Number: [id number] Date of Birth/Sex: Treating RN: 1941/05/13 (79 y.o. [name] Provider: Glennon Mac., ROBERT Other Clinician: Referring Provider: Treating Provider/Extender:Freddy Kinne, Pamella Pert., ROBERT Weeks in Treatment: 9 History of Present Illness HPI Description: ADMISSION 10/11/2019 This is a 79 year old man with a history of a severe cardiomyopathy with an ejection fraction of about 20%, chronic renal failure stage III. He is listed as a type II diabetic in epic although the patient denies this. He also has a history of PVD. He states for the last month he has had wounds on his bilateral lower extremities that started off as blisters which denuded. He has areas on the left lateral calf and 2 on the right lateral. He has an area on the left first met head which he did not know was there he we identified this on intake. He has been using Silvadene cream provided by his primary care physician but he is complaining that this burns. 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 He was acutely ill at that time complicating GI bleed. ABIs in our clinic were 1.16 on the right and 1.13 on the left 2/25; the patient comes in with his areas on the left lateral and right lateral calf. There is also an area over the left first MTP bunion deformity. We have been using Sorbact. His edema control is fairly good 3/4; left lateral and right  lateral calf. Most of his wounds are in the same position tightly adherent nonviable debris. On the right we debrided the superior wound on the left both wounds. The area on his bunion over the left first MTP medially is I think just about closed. We have been using Sorbact without a lot of success changed to Iodoflex under compression 3/11; left lateral and right lateral calf perhaps minor improvement in the surface condition. We have been using Iodoflex. The area over the bunion of the left first MTP has closed over 3/18; left lateral and right lateral calf not much improvement. We have been using Iodoflex. Aggressive debridement last week. 3/25; left lateral and right lateral calf. We have been using Iodoflex. There is some improvement in the superior area on the right and 2 on the lateral left although there is still a lot of debris on the surface. The inferior area on the right is still a completely nonviable surface. 4/8; left lateral and right lateral calfs. Essentially mirror-image looking wounds 2 wounds on each side in close juxtaposition we have been using Iodoflex with some improvement in the very adherent fibrinous debris but not a lot. The patient has arterial studies next Wednesday morning and venous studies next Thursday morning. I have been avoiding any further aggressive debridement until we see the arterial study results. His ABIs were fairly good in this clinic and is dorsalis pedis pulses are palpable but the wound beds are pale. 4/15; left lateral and right lateral calfs. Essentially mirror-image wounds with 2 wounds on each side in close juxtaposition but with rims of separating normal tissue. We have been using  Iodoflex. The base of the wounds has been cleaning up quite nicely ARTERIAL STUDIES were done showing the patient had an ABI on the right of 1.27 with triphasic waveforms and a TBI of 0.90 on the left triphasic waveforms with an ABI of 1.28 and a TBI of 0.87. No  evidence of arterial disease VENOUS REFLUX STUDIES; were done yesterday we do not have this report yet. Electronic Signature(s) Signed: 12/15/2019 6:55:41 AM By: Linton Ham MD Entered By: Linton Ham on 12/13/2019 13:01:57 -------------------------------------------------------------------------------- Physical Exam Details Patient Name: Date of Service: [name], [name] 12/13/2019 10:15 AM Medical Record [id number] Patient Account Number: [id number] Date of Birth/Sex: Treating RN: 05/06/1941 (79 y.o. [name] Provider: Glennon Mac., ROBERT Other Clinician: Referring Provider: Treating Provider/Extender:Red Mandt, Pamella Pert., ROBERT Weeks in Treatment: 9 Cardiovascular Pedal pulses are palpable. Integumentary (Hair, Skin) Likely chronic lymphedema and chronic venous insufficiency in the bilateral lower extremity. Notes Wound exam; the patient has essentially mirror-image wounds. The base of these wounds still has surface debris although it looks better. Washed off with wound cleanser and gauze. No mechanical debridement. Electronic Signature(s) Signed: 12/15/2019 6:55:41 AM By: Linton Ham MD Entered By: Linton Ham on 12/13/2019 13:02:40 -------------------------------------------------------------------------------- Physician Orders Details Patient Name: Date of Service: [name], [name] 12/13/2019 10:15 AM Medical Record [id number] Patient Account Number: [id number] Date of Birth/Sex: Treating RN: 23-Jan-1941 (79 y.o. 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 - Tuesday or Friday. Dressing Change Frequency Other: - change twice a week by home health. Skin Barriers/Peri-Wound Care Moisturizing lotion TCA Cream or Ointment - liberally in clinic today mixed with lotion. Wound Cleansing May shower with protection. - use cast protectors on the days dressings are not changed. May shower and wash wound with soap and water. - with dressing changes only. Primary Wound Dressing Wound #1 Left,Lateral Lower Leg Iodoflex - if home health does not have iodoflex may use iodosorb ointment. Wound #3 Right,Proximal,Lateral Lower Leg Iodoflex - if home health does not have iodoflex may use iodosorb ointment. Wound #4 Right,Distal,Lateral Lower Leg Iodoflex - if home health does not have iodoflex may use iodosorb ointment. Wound #6 Left,Proximal,Lateral Lower Leg Iodoflex - if home health does not have iodoflex may use iodosorb ointment. Secondary Dressing ABD pad Other: - pad left medial first met head for protection. Edema Control 4 layer compression - Bilateral - ensure to wrap from foot to just below calf. Avoid standing for long periods of time Elevate legs to the level of the heart or above for 30 minutes daily and/or when sitting, a frequency of: - throughout the day. [facility] for wound care. [facility] Electronic Signature(s) Signed: 12/13/2019 4:13:32 PM By: Deon Pilling Signed: 12/15/2019 6:55:41 AM By: Linton Ham MD Entered By: Deon Pilling on 12/13/2019 11:22:50 -------------------------------------------------------------------------------- Problem List Details Patient Name: Date of  Service: [name], [name] 12/13/2019 10:15 AM Medical Record [id number] Patient Account Number: [id number] Date of Birth/Sex: Treating  RN: 04-01-1941 (79 y.o. [name] Provider: Glennon Mac., ROBERT Other Clinician: Referring Provider: Treating Provider/Extender:Rochele Lueck, Pamella Pert., Tacy Learn in Treatment: 9 Active Problems ICD-10 Evaluated Encounter Code Description Active Date Today Diagnosis I87.323 Chronic venous hypertension (idiopathic) with 10/11/2019 No Yes inflammation of bilateral lower extremity I89.0 Lymphedema, not elsewhere classified 10/11/2019 No Yes L97.822 Non-pressure chronic ulcer of other part of left lower 10/11/2019 No Yes leg with fat layer exposed L97.112 Non-pressure chronic ulcer of right thigh with fat layer 10/11/2019 No Yes exposed L97.521 Non-pressure chronic ulcer of other part of left foot 10/11/2019 No Yes limited to breakdown of skin Inactive Problems Resolved Problems Electronic Signature(s) Signed: 12/15/2019 6:55:41 AM By: Linton Ham MD Entered By: Linton Ham on 12/13/2019 13:00:13 -------------------------------------------------------------------------------- Progress Note Details Patient Name: Date of Service: [name], [name] 12/13/2019 10:15 AM Medical Record [id number] Patient Account Number: [id number] Date of Birth/Sex: Treating RN: 1941-02-14 (79 y.o. [name] Provider: Glennon Mac., ROBERT Other Clinician: Referring Provider: Treating Provider/Extender:Zabella Wease, Pamella Pert., ROBERT Weeks in Treatment: 9 Subjective History of Present Illness (HPI) ADMISSION 10/11/2019 This is a 79 year old man with a history of a severe cardiomyopathy with an ejection fraction of about 20%, chronic renal failure stage III. He is listed as a type II diabetic in epic although the patient denies this. He also has a history of PVD. He states for the last month he has had wounds  on his bilateral lower extremities that started off as blisters which denuded. He has areas on the left lateral calf and 2 on the right lateral. He has an area on the left first met head which he did not know was there he we identified this on intake. He has been using Silvadene cream provided by his primary care physician but he is complaining that this burns. 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 He was acutely ill at that time complicating GI bleed. ABIs in our clinic were 1.16 on the right and 1.13 on the left 2/25; the patient comes in with his areas on the left lateral and right lateral calf. There is also an area over the left first MTP bunion deformity. We have been using Sorbact. His edema control is fairly good 3/4; left lateral and right lateral calf. Most of his wounds are in the same position tightly adherent nonviable debris. On the right we debrided the superior wound on the left both wounds. The area on his bunion over the left first MTP medially is I think just about closed. We have been using Sorbact without a lot of success changed to Iodoflex under compression 3/11; left lateral and right lateral calf perhaps minor improvement in the surface condition. We have been using Iodoflex. The area over the bunion of the left first MTP has closed over 3/18; left lateral and right lateral calf not much improvement. We have been using Iodoflex. Aggressive debridement last week. 3/25; left lateral and right lateral calf. We have been using Iodoflex. There is some improvement in the superior area on the right and 2 on the lateral  left although there is still a lot of debris on the surface. The inferior  area on the right is still a completely nonviable surface. 4/8; left lateral and right lateral calfs. Essentially mirror-image looking wounds 2 wounds on each side in close juxtaposition we have been using Iodoflex with some improvement in the very adherent fibrinous debris but not a lot. The patient has arterial studies next Wednesday morning and venous studies next Thursday morning. I have been avoiding any further aggressive debridement until we see the arterial study results. His ABIs were fairly good in this clinic and is dorsalis pedis pulses are palpable but the wound beds are pale. 4/15; left lateral and right lateral calfs. Essentially mirror-image wounds with 2 wounds on each side in close juxtaposition but with rims of separating normal tissue. We have been using Iodoflex. The base of the wounds has been cleaning up quite nicely ARTERIAL STUDIES were done showing the patient had an ABI on the right of 1.27 with triphasic waveforms and a TBI of 0.90 on the left triphasic waveforms with an ABI of 1.28 and a TBI of 0.87. No evidence of arterial disease VENOUS REFLUX STUDIES; were done yesterday we do not have this report yet. Objective Constitutional Vitals Time Taken: 10:15 AM, Height: 71 in, Weight: 198 lbs, BMI: 27.6, Respiratory Rate: 20 breaths/min. Cardiovascular Pedal pulses are palpable. General Notes: Wound exam; the patient has essentially mirror-image wounds. The base of these wounds still has surface debris although it looks better. Washed off with wound cleanser and gauze. No mechanical debridement. Integumentary (Hair, Skin) Likely chronic lymphedema and chronic venous insufficiency in the bilateral lower extremity. Wound #1 status is Open. Original cause of wound was Blister. The wound is located on the Left,Lateral Lower Leg. The wound measures 2.2cm length x 2cm width x 0.3cm depth; 3.456cm^2  area and 1.037cm^3 volume. There is Fat Layer (Subcutaneous Tissue) Exposed exposed. There is no tunneling or undermining noted. There is a medium amount of serosanguineous drainage noted. The wound margin is distinct with the outline attached to the wound base. There is medium (34-66%) pink granulation within the wound bed. There is a medium (34-66%) amount of necrotic tissue within the wound bed including Adherent Slough. Wound #3 status is Open. Original cause of wound was Blister. The wound is located on the Right,Proximal,Lateral Lower Leg. The wound measures 3cm length x 2cm width x 0.3cm depth; 4.712cm^2 area and 1.414cm^3 volume. There is Fat Layer (Subcutaneous Tissue) Exposed exposed. There is no tunneling or undermining noted. There is a medium amount of serosanguineous drainage noted. The wound margin is well defined and not attached to the wound base. There is medium (34-66%) red, pink granulation within the wound bed. There is a medium (34-66%) amount of necrotic tissue within the wound bed including Adherent Slough. Wound #4 status is Open. Original cause of wound was Blister. The wound is located on the Right,Distal,Lateral Lower Leg. The wound measures 2.7cm length x 2.6cm width x 0.2cm depth; 5.513cm^2 area and 1.103cm^3 volume. There is Fat Layer (Subcutaneous Tissue) Exposed exposed. There is no tunneling or undermining noted. There is a medium amount of serosanguineous drainage noted. The wound margin is distinct with the outline attached to the wound base. There is small (1-33%) pink granulation within the wound bed. There is a large (67- 100%) amount of necrotic tissue within the wound bed including Adherent Slough. Wound #6 status is Open. Original cause of wound was Gradually Appeared. The wound is located on the Left,Proximal,Lateral Lower Leg. The wound measures 2.5cm length x 1.7cm width x 0.3cm depth; 3.338cm^2  area and 1.001cm^3 volume. There is Fat Layer  (Subcutaneous Tissue) Exposed exposed. There is no tunneling or undermining noted. There is a medium amount of serosanguineous drainage noted. The wound margin is flat and intact. There is medium (34-66%) red granulation within the wound bed. There is a medium (34-66%) amount of necrotic tissue within the wound bed including Adherent Slough. 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 There was a Four Layer Compression Therapy Procedure by Baruch Gouty, RN. Post procedure Diagnosis Wound #1: Same as Pre-Procedure Wound #3 Pre-procedure diagnosis of Wound #3 is a Venous Leg Ulcer located on the Right,Proximal,Lateral Lower Leg . There was a Four Layer Compression Therapy Procedure by Baruch Gouty, RN. Post procedure Diagnosis Wound #3: Same as Pre-Procedure Wound #4 Pre-procedure diagnosis of Wound #4 is a Venous Leg Ulcer located on the Right,Distal,Lateral Lower Leg . There was a Four Layer Compression Therapy Procedure by Baruch Gouty, RN. Post procedure Diagnosis Wound #4: Same as Pre-Procedure Wound #6 Pre-procedure diagnosis of Wound #6 is a Venous Leg Ulcer located on the Left,Proximal,Lateral Lower Leg . There was a Four Layer Compression Therapy Procedure by Baruch Gouty, RN. Post procedure Diagnosis Wound #6: Same as Pre-Procedure Plan Follow-up Appointments: Return Appointment in 1 week. - Tuesday or Friday. Dressing Change Frequency: Other: - change twice a week by home health. Skin Barriers/Peri-Wound  Care: Moisturizing lotion TCA Cream or Ointment - liberally in clinic today mixed with lotion. Wound Cleansing: May shower with protection. - use cast protectors on the days dressings are not changed. May shower and wash wound with soap and water. - with dressing changes only. Primary Wound Dressing: Wound #1 Left,Lateral Lower Leg: Iodoflex - if home health does not have iodoflex may use iodosorb ointment. Wound #3 Right,Proximal,Lateral Lower Leg: Iodoflex - if home health does not have iodoflex may use iodosorb ointment. Wound #4 Right,Distal,Lateral Lower Leg: Iodoflex - if home health does not have iodoflex may use iodosorb ointment. Wound #6 Left,Proximal,Lateral Lower Leg: Iodoflex - if home health does not have iodoflex may use iodosorb ointment. Secondary Dressing: ABD pad Other: - pad left medial first met head for protection. Edema Control: 4 layer compression - Bilateral - ensure to wrap from foot to just below calf. Avoid standing for long periods of time Elevate legs to the level of the heart or above for 30 minutes daily and/or when sitting, a frequency of: - throughout the day. Home Health: [facility] for wound care. - [facility] 1. I am continuing with Iodoflex in both wound areas 2. We will review reflux studies when they are available 3. Normal arterial studies allow for aggressive compression i.e. 4-layer compression bilaterally. Electronic Signature(s) Signed: 12/15/2019 6:55:41 AM By: Linton Ham MD Entered By: Linton Ham on 12/13/2019 13:04:53 -------------------------------------------------------------------------------- SuperBill Details Patient Name: Date of Service: [name], [name] 12/13/2019 Medical Record [id number] Patient Account Number: [id number] Date of Birth/Sex: Treating RN: [birth year] (79 y.o. 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 Physician Procedures CPT4 Code Description: [id number] - WC PHYS LEVEL 3 - EST PT ICD-10 Diagnosis Description L97.822 Non-pressure chronic ulcer of other part of left lower leg I87.323 Chronic venous hypertension (idiopathic) with inflammation extremity I89.0 Lymphedema,  not elsewhere classified Modifier: with fat layer expo of bilateral lower Quantity: 1 sed Electronic Signature(s) Signed: 12/15/2019 6:55:41 AM By: Linton Ham MD Entered By: Linton Ham on 12/13/2019 13:05:22

## 2019-12-17 IMAGING — US US ABDOMEN LIMITED
1 series · 13 of 25 positions shown · non-contrast
Comparison: Duplex liver ultrasound obtained yesterday. Abdomen and
pelvis CT dated 06/20/2019.

CLINICAL DATA: History of cholelithiasis, choledocholithiasis and
percutaneous cholecystostomy. Clinical concern for cholecystitis.

EXAM:
ULTRASOUND ABDOMEN LIMITED RIGHT UPPER QUADRANT

[Series 1: us abdomen limited · 13 of 44 slices shown]
[im 1/44]
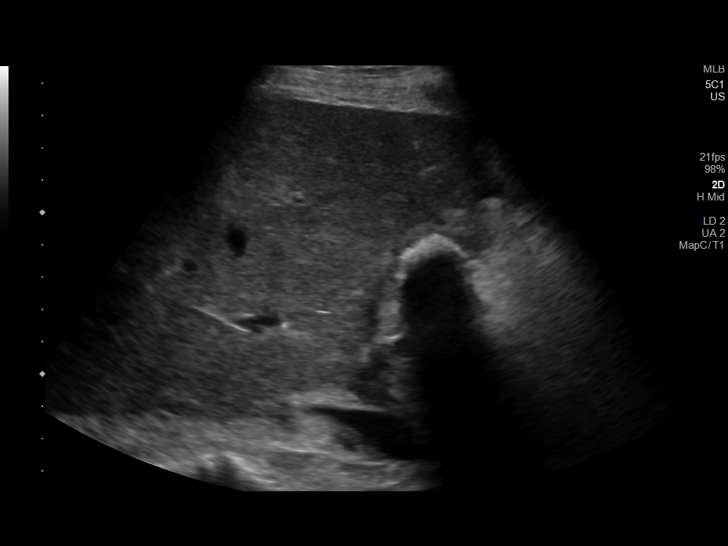
[im 4/44]
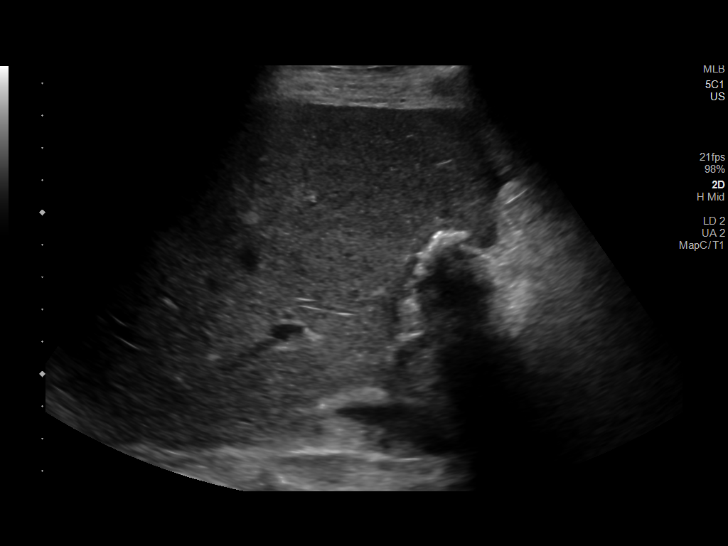
[im 8/44]
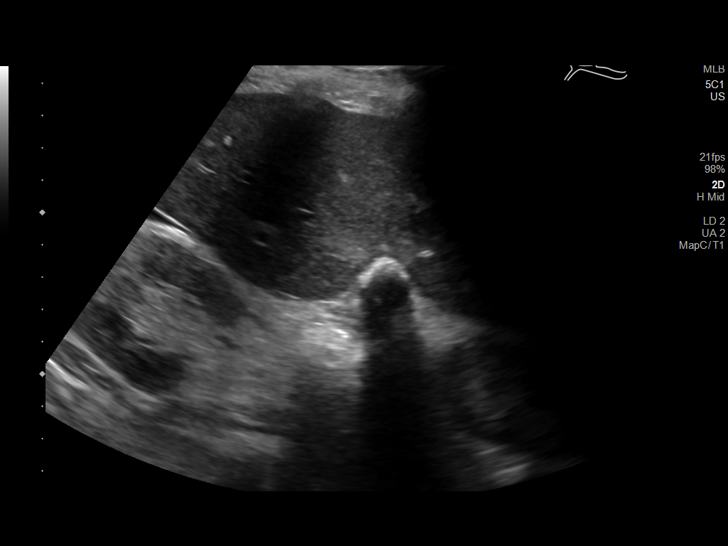
[im 11/44]
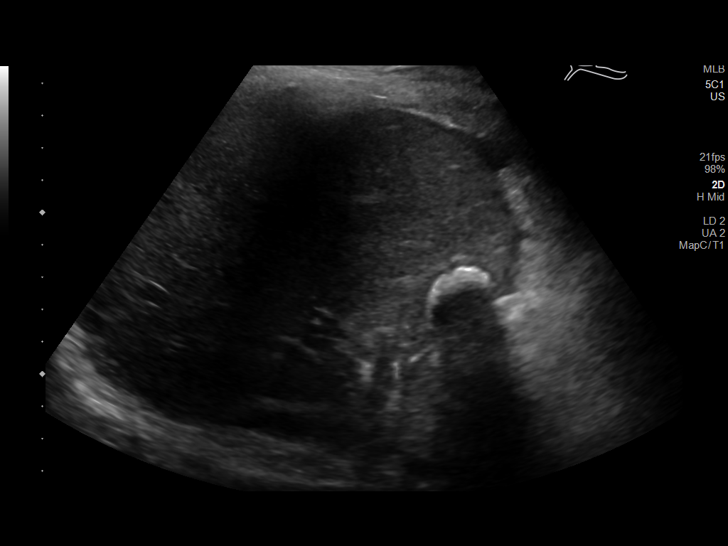
[im 15/44]
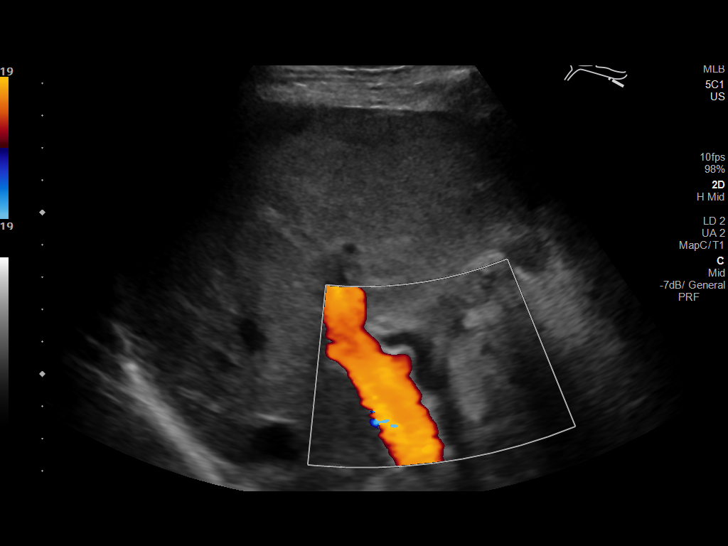
[im 18/44]
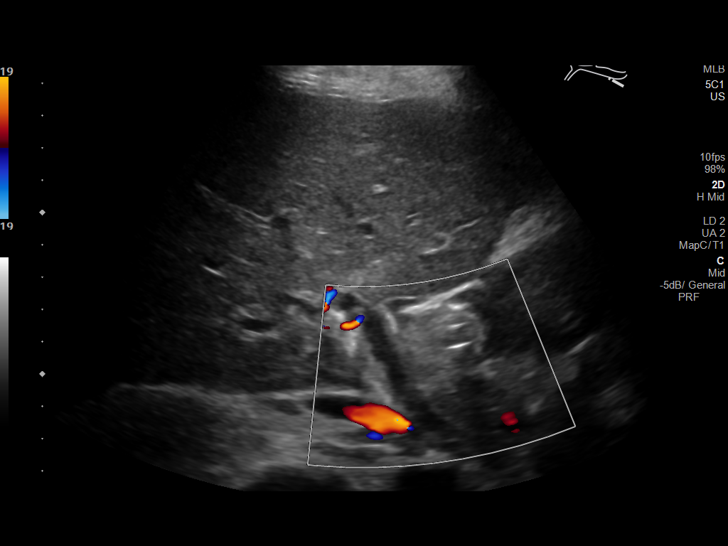
[im 22/44]
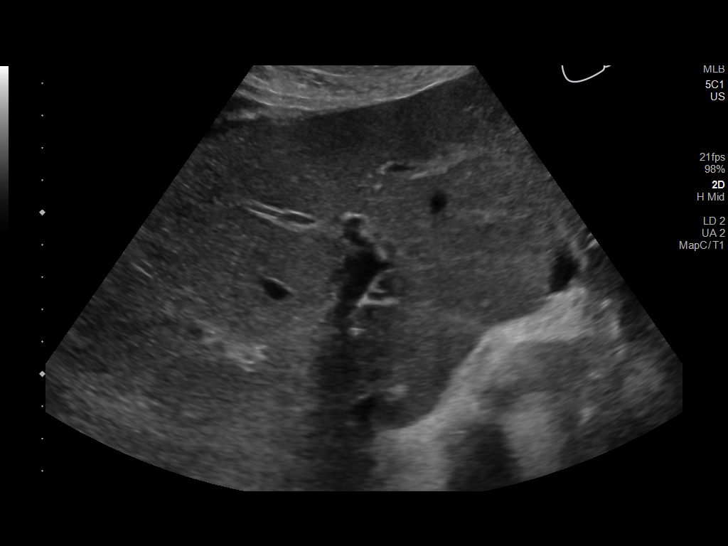
[im 26/44]
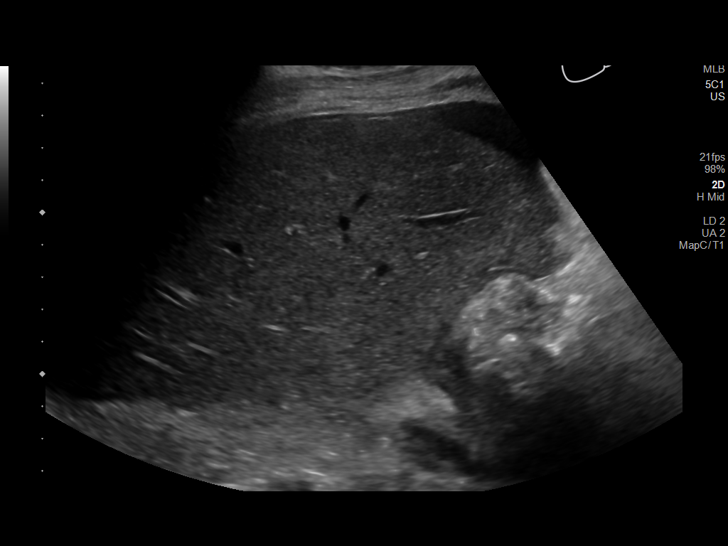
[im 29/44]
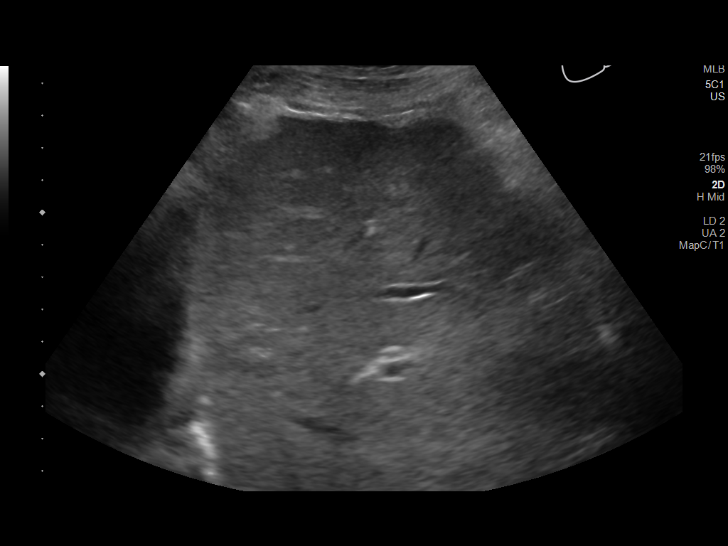
[im 33/44]
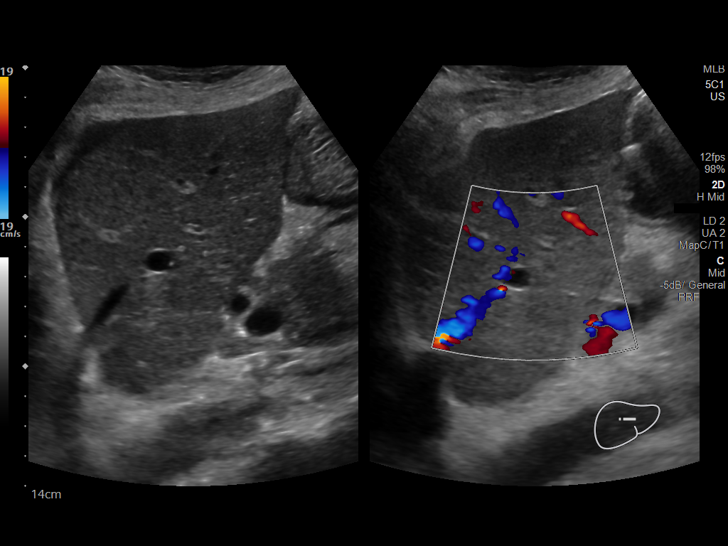
[im 36/44]
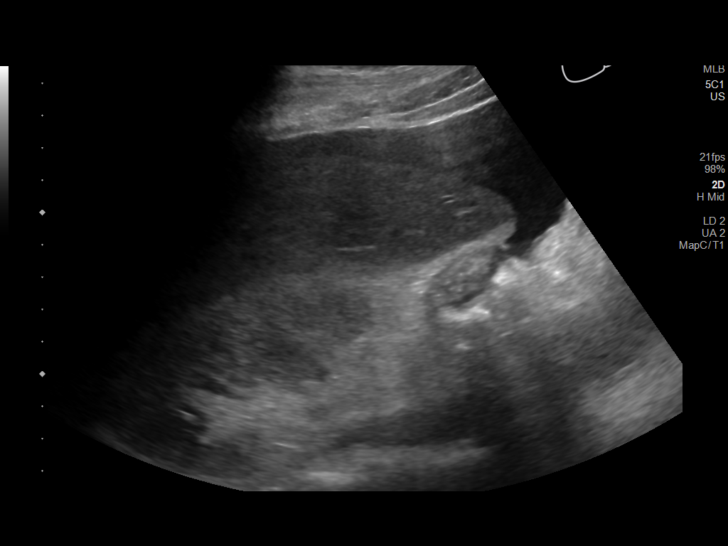
[im 40/44]
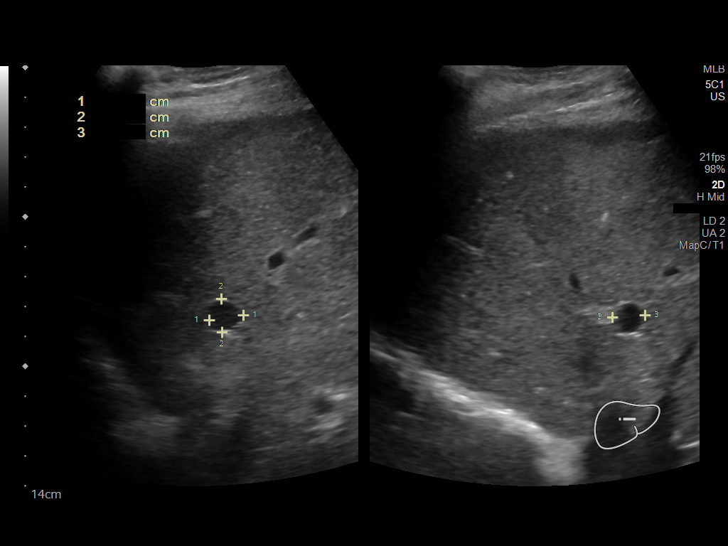
[im 44/44]
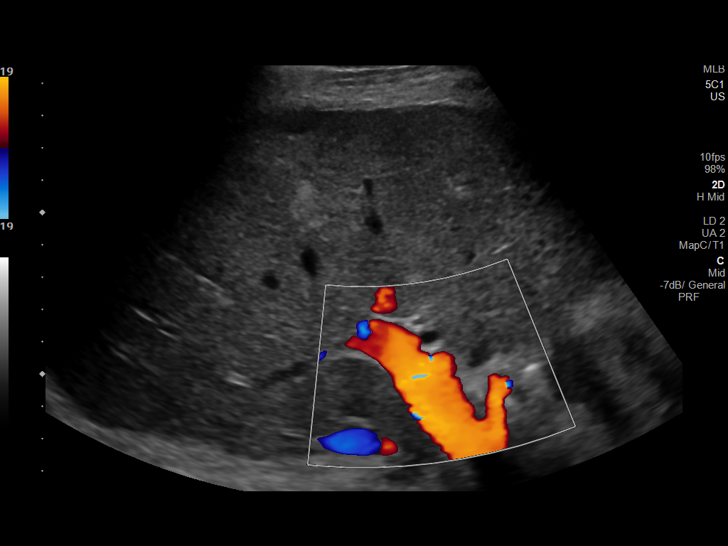

[13 of 25 positions shown; findings below may reference images not displayed]

FINDINGS: Gallbladder:

Contracted gallbladder containing a 2.2 cm densely shadowing stone.
The stone is not visible on the CT other than questionable minimal
calcific density in that region. Inferior to that level, there is
dense barium in bowel loops on the CT. It is possible that the
apparent stone could represent barium in 1 of the bowel loops
extending into the region of the gallbladder fossa. The bowel or
gallbladder wall is not well visualized, measuring approximately
mm in thickness. No pericholecystic fluid and no sonographic Murphy
sign.

Common bile duct:

Diameter: 5.4 mm

Liver:

Mildly nodular contours are again demonstrated. 1.2 cm cyst in the
right lobe and 1.0 cm cyst in the left lobe. Portal vein is patent
on color Doppler imaging with normal direction of blood flow towards
the liver.

Other: Small to moderate amount of ascites. Increased echogenicity
of the right kidney.
IMPRESSION: 1. Contracted gallbladder containing a 2.2 cm calculus versus bowel
loop containing retained barium in the region of the gallbladder
fossa.
2. No evidence of acute cholecystitis.
3. Mild nodular contours of the liver are again demonstrated,
suggesting changes due to cirrhosis of the liver.
4. Small to moderate amount of ascites.
5. Echogenic right kidney, compatible with medical renal disease.

## 2019-12-18 IMAGING — CR DG ABDOMEN 2V
3 series · 3 of 3 positions shown · non-contrast
Comparison: CT of the abdomen pelvis 06/20/2019 and ultrasound
06/29/2019

CLINICAL DATA: Abdominal distension.

EXAM:
ABDOMEN - 2 VIEW

[abdomen erect]
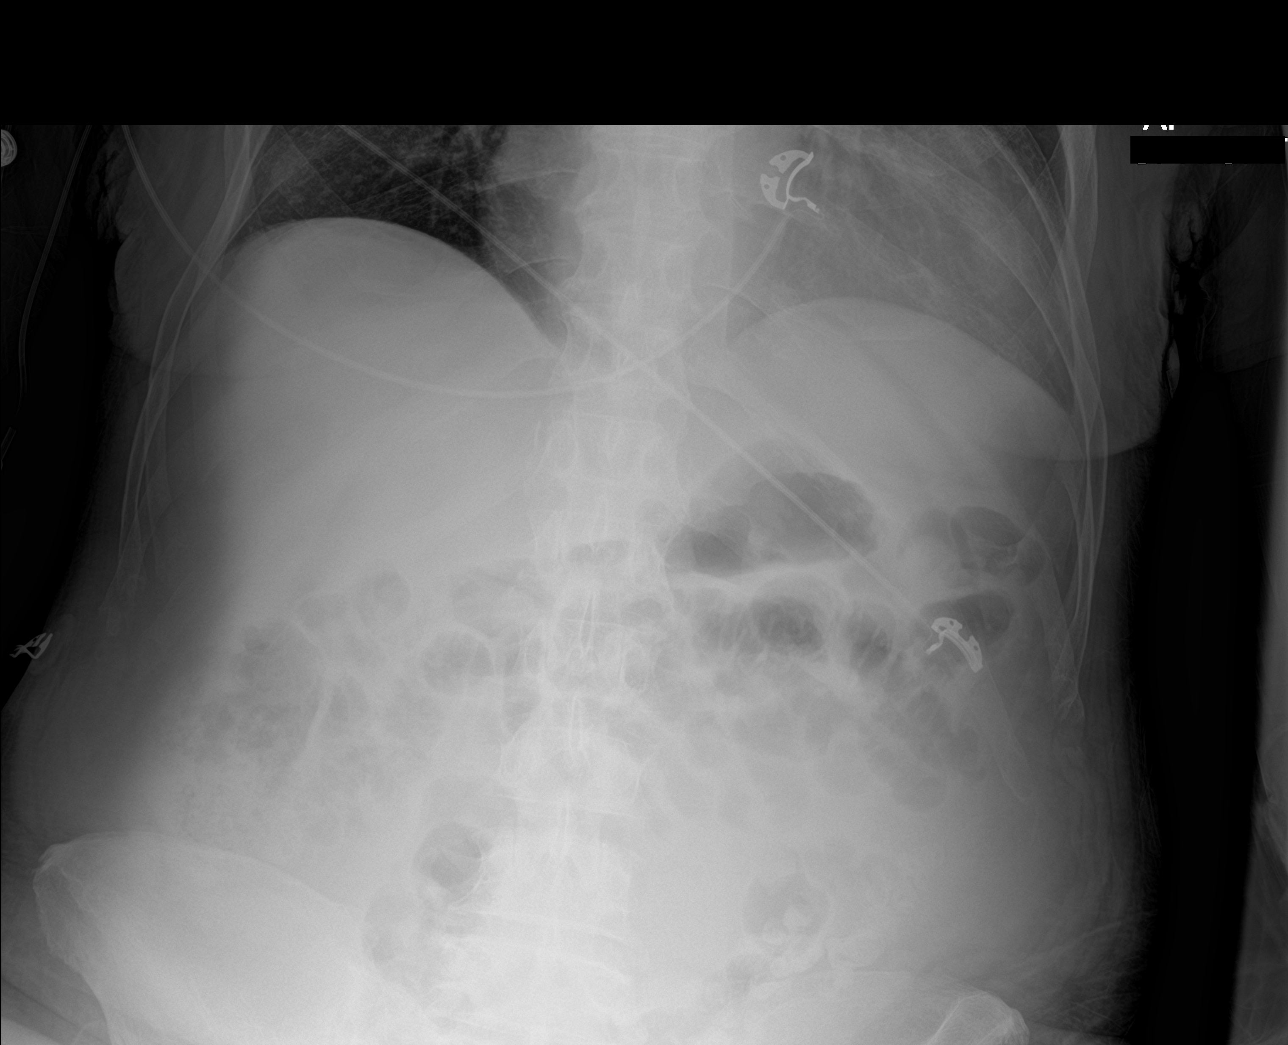

[abdomen supine (1 of 2)]
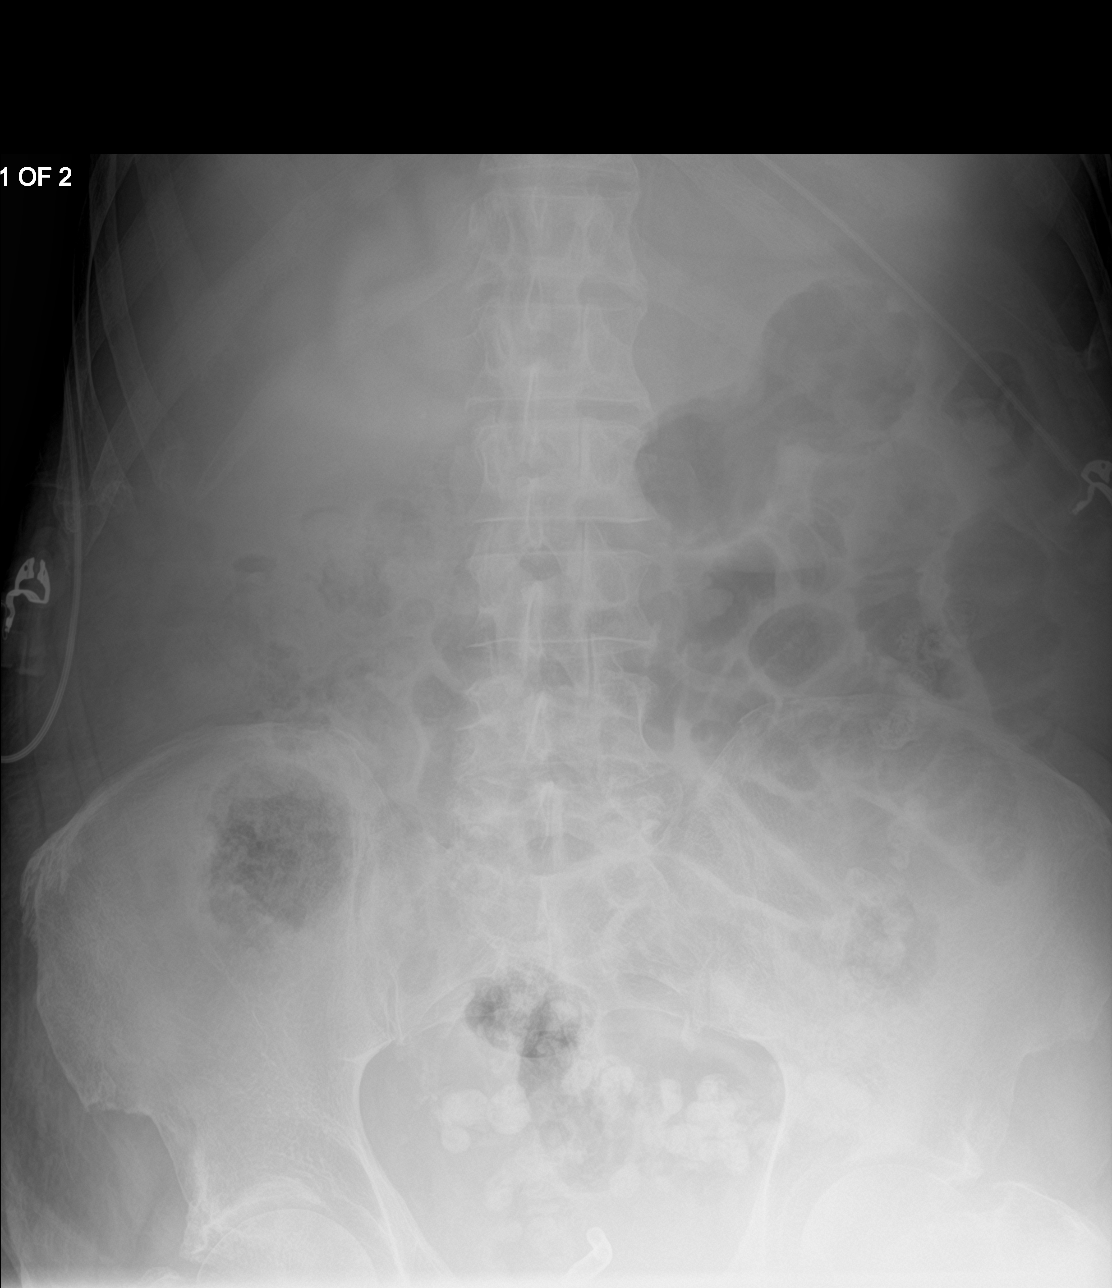

[abdomen supine (2 of 2)]
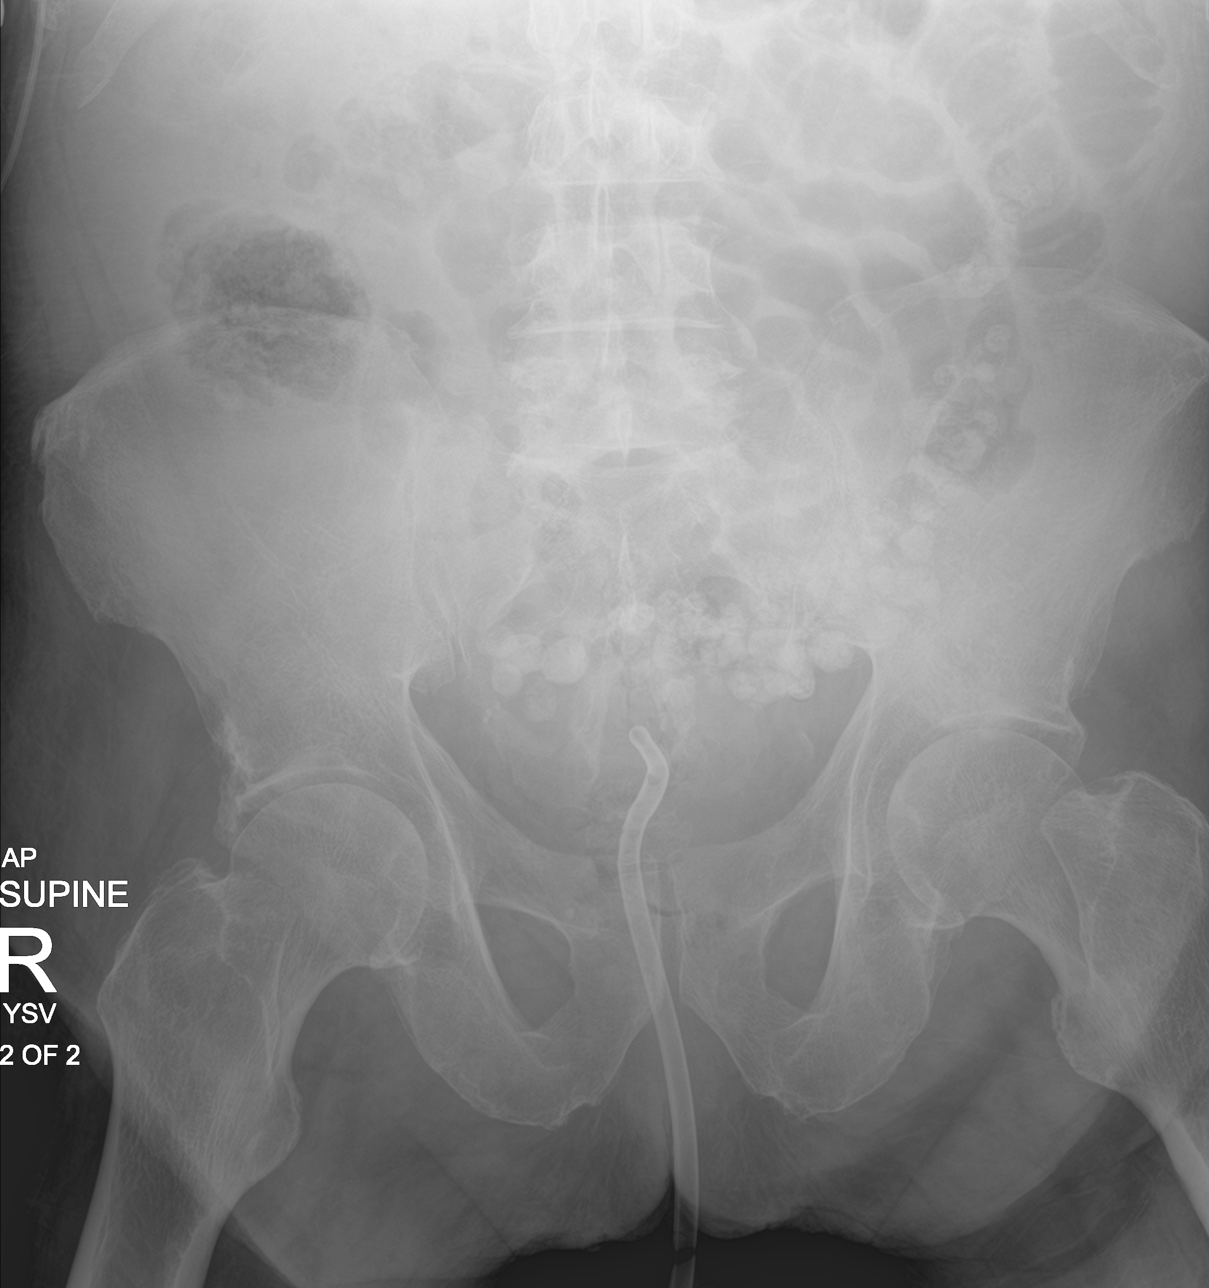

[3 of 3 positions shown; findings below may reference images not displayed]

FINDINGS: Negative for free air. Bowel gas throughout the abdomen with a
nonobstructive pattern. Residual oral contrast in the sigmoid colon
demonstrates numerous colonic diverticula. Slight medialization of
the bowel structures probably related to the known ascites. Evidence
for urinary Foley catheter. Cardiac silhouette appears to be large
for size.
IMPRESSION: Nonobstructive bowel gas pattern.

Colonic diverticula.

## 2019-12-21 ENCOUNTER — Encounter (HOSPITAL_BASED_OUTPATIENT_CLINIC_OR_DEPARTMENT_OTHER): Payer: Medicare Other | Admitting: Internal Medicine

## 2019-12-21 ENCOUNTER — Other Ambulatory Visit: Payer: Self-pay

## 2019-12-21 DIAGNOSIS — I87333 Chronic venous hypertension (idiopathic) with ulcer and inflammation of bilateral lower extremity: Secondary | ICD-10-CM | POA: Diagnosis not present

## 2019-12-21 NOTE — Progress Notes (Signed)
[name], [name] ([id number]) Visit Report for 12/21/2019 Debridement Details Patient Name: Date of Service: [name], [name] 12/21/2019 2:45 PM Medical Record [id number] Patient Account Number: [id number] Date of Birth/Sex: Treating RN: [birth year] (79 y.o. [name] Primary Care Provider: Glennon Mac., ROBERT Other Clinician: Referring Provider: Treating Provider/Extender:Robson, Pamella Pert., ROBERT Weeks in Treatment: 10 Debridement Performed for Wound #1 Left,Lateral Lower Leg Assessment: Performed By: Physician Ricard Dillon., MD Debridement Type: Debridement Severity of Tissue Pre Fat layer exposed Debridement: Level of Consciousness (Pre- Awake and Alert procedure): Pre-procedure Verification/Time Out Taken: Yes - 15:35 Start Time: 15:35 Pain Control: Other : benzocaine, 20% Total Area Debrided (L x W): 2.2 (cm) x 2 (cm) = 4.4 (cm) Tissue and other material Viable, Non-Viable, Subcutaneous debrided: Level: Skin/Subcutaneous Tissue Debridement Description: Excisional Instrument: Curette Bleeding: Moderate Hemostasis Achieved: Silver Nitrate End Time: 15:36 Procedural Pain: 2 Post Procedural Pain: 0 Response to Treatment: Procedure was tolerated well Level of Consciousness Awake and Alert (Post-procedure): Post Debridement Measurements of Total Wound Length: (cm) 2.2 Width: (cm) 2 Depth: (cm) 0.2 Volume: (cm) 0.691 Character of Wound/Ulcer Post Improved Debridement: Severity of Tissue Post Debridement: Fat layer exposed Post Procedure Diagnosis Same as Pre-procedure Electronic Signature(s) Signed: 12/21/2019 5:13:47 PM By: Kela Millin Signed: 12/21/2019 5:22:40 PM By: Linton Ham MD Entered By: Linton Ham on 12/21/2019 16:10:53 -------------------------------------------------------------------------------- Debridement Details Patient Name: Date of Service: [name], [name] 12/21/2019 2:45 PM Medical Record  [id number] Patient Account Number: [id number] Date of Birth/Sex: Treating RN: 19-Mar-1941 (79 y.o. [name] Primary Care Provider: Glennon Mac., ROBERT Other Clinician: Referring Provider: Treating Provider/Extender:Robson, Pamella Pert., ROBERT Weeks in Treatment: 10 Debridement Performed for Wound #3 Right,Proximal,Lateral Lower Leg Assessment: Performed By: Physician Ricard Dillon., MD Debridement Type: Debridement Severity of Tissue Pre Fat layer exposed Debridement: Level of Consciousness (Pre- Awake and Alert procedure): Pre-procedure Yes - 15:35 Verification/Time Out Taken: Start Time: 15:35 Pain Control: Other : benzocaine, 20% Total Area Debrided (L x W): 3 (cm) x 2 (cm) = 6 (cm) Tissue and other material Viable, Non-Viable, Subcutaneous debrided: Level: Skin/Subcutaneous Tissue Debridement Description: Excisional Instrument: Curette Bleeding: Minimum Hemostasis Achieved: Pressure End Time: 15:36 Procedural Pain: 2 Post Procedural Pain: 0 Response to Treatment: Procedure was tolerated well Level of Consciousness Awake and Alert (Post-procedure): Post Debridement Measurements of Total Wound Length: (cm) 3 Width: (cm) 2 Depth: (cm) 0.2 Volume: (cm) 0.942 Character of Wound/Ulcer Post Improved Debridement: Severity of Tissue Post Debridement: Fat layer exposed Post Procedure Diagnosis Same as Pre-procedure Electronic Signature(s) Signed: 12/21/2019 5:13:47 PM By: Kela Millin Signed: 12/21/2019 5:22:40 PM By: Linton Ham MD Entered By: Linton Ham on 12/21/2019 16:11:01 -------------------------------------------------------------------------------- Debridement Details Patient Name: Date of Service: [name], [name] 12/21/2019 2:45 PM Medical Record [id number] Patient Account Number: [id number] Date of Birth/Sex: Treating RN: December 10, 1940 (79 y.o. [name] Primary Care Provider: Glennon Mac.,  ROBERT Other Clinician: Referring Provider: Treating Provider/Extender:Robson, Pamella Pert., ROBERT Weeks in Treatment: 10 Debridement Performed for Wound #4 Right,Distal,Lateral Lower Leg Assessment: Performed By: Physician Ricard Dillon., MD Debridement Type: Debridement Severity of Tissue Pre Fat layer exposed Debridement: Level of Consciousness (Pre- Awake and Alert procedure): Pre-procedure Verification/Time Out Taken: Yes - 15:35 Start Time: 15:35 Pain Control: Other : benzocaine, 20% Total Area Debrided (L x W): 2.7 (cm) x 2.5 (cm) = 6.75 (cm) Tissue and other material Viable, Non-Viable, Subcutaneous debrided: Level: Skin/Subcutaneous Tissue Debridement Description: Excisional Instrument: Curette Bleeding: Minimum Hemostasis Achieved: Pressure End Time: 15:36 Procedural Pain: 2 Post Procedural  Pain: 0 Response to Treatment: Procedure was tolerated well Level of Consciousness Awake and Alert (Post-procedure): Post Debridement Measurements of Total Wound Length: (cm) 2.7 Width: (cm) 2.5 Depth: (cm) 0.2 Volume: (cm) 1.06 Character of Wound/Ulcer Post Improved Debridement: Severity of Tissue Post Debridement: Fat layer exposed Post Procedure Diagnosis Same as Pre-procedure Electronic Signature(s) Signed: 12/21/2019 5:13:47 PM By: Kela Millin Signed: 12/21/2019 5:22:40 PM By: Linton Ham MD Entered By: Linton Ham on 12/21/2019 16:11:11 -------------------------------------------------------------------------------- Debridement Details Patient Name: Date of Service: [name], [name] 12/21/2019 2:45 PM Medical Record [id number] Patient Account Number: [id number] Date of Birth/Sex: Treating RN: [birth year] (79 y.o. [name] Primary Care Provider: Glennon Mac., ROBERT Other Clinician: Referring Provider: Treating Provider/Extender:Robson, Pamella Pert., ROBERT Weeks in Treatment: 10 Debridement Performed  for Wound #6 Left,Proximal,Lateral Lower Leg Assessment: Performed By: Physician Ricard Dillon., MD Debridement Type: Debridement Severity of Tissue Pre Fat layer exposed Debridement: Level of Consciousness (Pre- Awake and Alert procedure): Pre-procedure Verification/Time Out Taken: Yes - 15:35 Start Time: 15:35 Pain Control: Other : benzocaine, 20% Total Area Debrided (L x W): 2.3 (cm) x 1.7 (cm) = 3.91 (cm) Tissue and other material Viable, Non-Viable, Subcutaneous debrided: Level: Skin/Subcutaneous Tissue Debridement Description: Excisional Instrument: Curette Bleeding: Moderate Hemostasis Achieved: Silver Nitrate End Time: 15:36 Procedural Pain: 2 Post Procedural Pain: 0 Response to Treatment: Procedure was tolerated well Level of Consciousness Awake and Alert (Post-procedure): Post Debridement Measurements of Total Wound Length: (cm) 2.3 Width: (cm) 1.7 Depth: (cm) 0.2 Volume: (cm) 0.614 Character of Wound/Ulcer Post Improved Debridement: Severity of Tissue Post Debridement: Fat layer exposed Post Procedure Diagnosis Same as Pre-procedure Electronic Signature(s) Signed: 12/21/2019 5:13:47 PM By: Kela Millin Signed: 12/21/2019 5:22:40 PM By: Linton Ham MD Entered By: Linton Ham on 12/21/2019 16:11:25 -------------------------------------------------------------------------------- HPI Details Patient Name: Date of Service: [name], [name] 12/21/2019 2:45 PM Medical Record [id number] Patient Account Number: [id number] Date of Birth/Sex: Treating RN: [birth year] (79 y.o. [name] Primary Care Provider: Glennon Mac., [place] Other Clinician: Referring Provider: Treating Provider/Extender:Robson, Pamella Pert., ROBERT Weeks in Treatment: 10 History of Present Illness HPI Description: ADMISSION 10/11/2019 This is a 79 year old man with a history of a severe cardiomyopathy with an ejection fraction of about 20%,  chronic renal failure stage III. He is listed as a type II diabetic in epic although the patient denies this. He also has a history of PVD. He states for the last month he has had wounds on his bilateral lower extremities that started off as blisters which denuded. He has areas on the left lateral calf and 2 on the right lateral. He has an area on the left first met head which he did not know was there he we identified this on intake. He has been using Silvadene cream provided by his primary care physician but he is complaining that this burns. 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 He was acutely ill at that time complicating GI bleed. ABIs in our clinic were 1.16 on the right and 1.13 on the left 2/25; the patient comes in with his areas on the left lateral and right lateral calf. There is also an area over the left first MTP bunion  deformity. We have been using Sorbact. His edema control is fairly good 3/4; left lateral and right lateral calf. Most of his wounds are in the same position tightly adherent nonviable debris. On the right we debrided the superior wound on the left both wounds. The area on his bunion over the left first MTP medially is I think just about closed. We have been using Sorbact without a lot of success changed to Iodoflex under compression 3/11; left lateral and right lateral calf perhaps minor improvement in the surface condition. We have been using Iodoflex. The area over the bunion of the left first MTP has closed over 3/18; left lateral and right lateral calf not much improvement. We have been using Iodoflex.  Aggressive debridement last week. 3/25; left lateral and right lateral calf. We have been using Iodoflex. There is some improvement in the superior area on the right and 2 on the lateral left although there is still a lot of debris on the surface. The inferior area on the right is still a completely nonviable surface. 4/8; left lateral and right lateral calfs. Essentially mirror-image looking wounds 2 wounds on each side in close juxtaposition we have been using Iodoflex with some improvement in the very adherent fibrinous debris but not a lot. The patient has arterial studies next Wednesday morning and venous studies next Thursday morning. I have been avoiding any further aggressive debridement until we see the arterial study results. His ABIs were fairly good in this clinic and is dorsalis pedis pulses are palpable but the wound beds are pale. 4/15; left lateral and right lateral calfs. Essentially mirror-image wounds with 2 wounds on each side in close juxtaposition but with rims of separating normal tissue. We have been using Iodoflex. The base of the wounds has been cleaning up quite nicely ARTERIAL STUDIES were done showing the patient had an ABI on the right of 1.27 with triphasic waveforms and a TBI of 0.90 on the left triphasic waveforms with an ABI of 1.28 and a TBI of 0.87. No evidence of arterial disease VENOUS REFLUX STUDIES; were done yesterday we do not have this report yet. 4/23; VENOUS REFLUX STUDIES did not show any significant reflux right lower extremity. No evidence of a DVT. He did have significant reflux in the common femoral vein on the left but nothing else was listed as significant. He did not have a DVT. We have been using Iodoflex under compression his wounds are making progress. Electronic Signature(s) Signed: 12/21/2019 5:22:40 PM By: Linton Ham MD Entered By: Linton Ham on 12/21/2019  16:14:49 -------------------------------------------------------------------------------- Physical Exam Details Patient Name: Date of Service: [name], [name] 12/21/2019 2:45 PM Medical Record [id number] Patient Account Number: [id number] Date of Birth/Sex: Treating RN: 1940-09-27 (79 y.o. [name] Primary Care Provider: Glennon Mac., ROBERT Other Clinician: Referring Provider: Treating Provider/Extender:Robson, Pamella Pert., ROBERT Weeks in Treatment: 10 Constitutional Patient is hypertensive.. Pulse regular and within target range for patient.Marland Kitchen Respirations regular, non-labored and within target range.. Temperature is normal and within the target range for the patient.Marland Kitchen Appears in no distress. Notes Wound exam; the surface of the wounds is significantly improved. All of them still require light debridements although they are cleaning up very nicely they also appear to have contracted. On the lower wound on the left there was a venous bleeder that required silver nitrate and prolonged compression Electronic Signature(s) Signed: 12/21/2019 5:22:40 PM By: Linton Ham MD Entered By: Linton Ham on 12/21/2019 16:15:44 -------------------------------------------------------------------------------- Physician Orders Details Patient Name: Date  of Service: [name], [name] 12/21/2019 2:45 PM Medical Record [id number] Patient Account Number: [id number] Date of Birth/Sex: Treating RN: [birth year] (79 y.o. 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 Dressing Change Frequency Other: - change twice a week by home health. Skin Barriers/Peri-Wound Care Moisturizing lotion TCA Cream or Ointment - liberally in clinic today mixed with lotion. Wound Cleansing May shower with protection. - use cast protectors on the days dressings are not changed. May shower and wash wound with soap and water. - with dressing changes only. Primary Wound Dressing Wound #1 Left,Lateral Lower Leg Iodoflex - if home health does not have iodoflex may use iodosorb ointment. Wound #3 Right,Proximal,Lateral Lower Leg Iodoflex - if home health does not have iodoflex may use iodosorb ointment. Wound #4 Right,Distal,Lateral Lower Leg Iodoflex - if home health does not have iodoflex may use iodosorb ointment. Wound #6 Left,Proximal,Lateral Lower Leg Iodoflex - if home health does not have iodoflex may use iodosorb ointment. Secondary Dressing ABD pad Other: - pad left medial first met head for protection. Edema Control 4 layer compression - Bilateral - ensure to wrap from foot to just below calf. Avoid standing for long periods of time Elevate legs to the level of the heart or above for 30 minutes daily and/or when sitting, a frequency of: - throughout the day. [facility] for wound care. [facility] Electronic Signature(s) Signed: 12/21/2019 5:13:47 PM By: Kela Millin Signed: 12/21/2019 5:22:40 PM By: Linton Ham MD Entered By: Kela Millin on 12/21/2019 15:17:46 -------------------------------------------------------------------------------- Problem List Details Patient Name: Date of Service: [name], [name] 12/21/2019 2:45 PM Medical Record  [id number] Patient Account Number: [id number] Date of Birth/Sex: Treating RN: 08-26-1941 (79 y.o. [name] Primary Care Provider: Glennon Mac., ROBERT Other Clinician: Referring Provider: Treating Provider/Extender:Robson, Pamella Pert., Tacy Learn in Treatment: 10 Active Problems ICD-10 Evaluated Encounter Code Description Active Date Today Diagnosis I87.323 Chronic venous hypertension (idiopathic) with 10/11/2019 No Yes inflammation of bilateral lower extremity I89.0 Lymphedema, not elsewhere classified 10/11/2019 No Yes L97.822 Non-pressure chronic ulcer of other part of left lower 10/11/2019 No Yes leg with fat layer exposed L97.112 Non-pressure chronic ulcer of right thigh with fat layer 10/11/2019 No Yes exposed L97.521 Non-pressure chronic ulcer of other part of left foot 10/11/2019 No Yes limited to breakdown of skin Inactive Problems Resolved Problems Electronic Signature(s) Signed: 12/21/2019 5:22:40 PM By: Linton Ham MD Entered By: Linton Ham on 12/21/2019 16:10:11 -------------------------------------------------------------------------------- Progress Note Details Patient Name: Date of Service: [name], [name] 12/21/2019 2:45 PM Medical Record [id number] Patient Account Number: [id number] Date of Birth/Sex: Treating RN: 10-29-1940 (79 y.o. [name] Primary Care Provider: Glennon Mac., [place] Other Clinician: Referring Provider: Treating Provider/Extender:Robson, Pamella Pert., ROBERT Weeks in Treatment: 10 Subjective History of Present Illness (HPI) ADMISSION 10/11/2019 This is a 79 year old man with a history of a severe cardiomyopathy with an ejection fraction of about 20%, chronic renal failure stage III. He is listed  as a type II diabetic in epic although the patient denies this. He also has a history of PVD. He states for the last month he has had wounds on his bilateral lower extremities that started off  as blisters which denuded. He has areas on the left lateral calf and 2 on the right lateral. He has an area on the left first met head which he did not know was there he we identified this on intake. He has been using Silvadene cream provided by his primary care physician but he is complaining that this burns. 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 He was acutely ill at that time complicating GI bleed. ABIs in our clinic were 1.16 on the right and 1.13 on the left 2/25; the patient comes in with his areas on the left lateral and right lateral calf. There is also an area over the left first MTP bunion deformity. We have been using Sorbact. His edema control is fairly good 3/4; left lateral and right lateral calf. Most of his wounds are in the same position tightly adherent nonviable debris. On the right we debrided the superior wound on the left both wounds. The area on his bunion over the left first MTP medially is I think just about closed. We have been using Sorbact without a lot of success changed to Iodoflex under compression 3/11; left lateral and right lateral calf perhaps minor improvement in the surface condition. We have been using Iodoflex. The area over the bunion of the left first MTP has closed over 3/18; left lateral and right lateral calf not much improvement. We have been using Iodoflex. Aggressive debridement last week. 3/25; left lateral and right lateral calf. We have been using Iodoflex. There is some improvement in the superior area on the right and 2 on the lateral left although there is still a lot of debris on the  surface. The inferior area on the right is still a completely nonviable surface. 4/8; left lateral and right lateral calfs. Essentially mirror-image looking wounds 2 wounds on each side in close juxtaposition we have been using Iodoflex with some improvement in the very adherent fibrinous debris but not a lot. The patient has arterial studies next Wednesday morning and venous studies next Thursday morning. I have been avoiding any further aggressive debridement until we see the arterial study results. His ABIs were fairly good in this clinic and is dorsalis pedis pulses are palpable but the wound beds are pale. 4/15; left lateral and right lateral calfs. Essentially mirror-image wounds with 2 wounds on each side in close juxtaposition but with rims of separating normal tissue. We have been using Iodoflex. The base of the wounds has been cleaning up quite nicely ARTERIAL STUDIES were done showing the patient had an ABI on the right of 1.27 with triphasic waveforms and a TBI of 0.90 on the left triphasic waveforms with an ABI of 1.28 and a TBI of 0.87. No evidence of arterial disease VENOUS REFLUX STUDIES; were done yesterday we do not have this report yet. 4/23; VENOUS REFLUX STUDIES did not show any significant reflux right lower extremity. No evidence of a DVT. He did have significant reflux  in the common femoral vein on the left but nothing else was listed as significant. He did not have a DVT. We have been using Iodoflex under compression his wounds are making progress. Objective Constitutional Patient is hypertensive.. Pulse regular and within target range for patient.Marland Kitchen Respirations regular, non-labored and within target range.. Temperature is normal and within the target range for the patient.Marland Kitchen Appears in no distress. Vitals Time Taken: 2:46 PM, Height: 71 in, Weight: 198 lbs, BMI: 27.6, Temperature: 97.7 F, Pulse: 89 bpm, Respiratory Rate: 20 breaths/min, Blood Pressure: 143/82  mmHg. General Notes: Wound exam; the surface of the wounds is significantly improved. All of them still require light debridements although they are cleaning up very nicely they also appear to have contracted. On the lower wound on the left there was a venous bleeder that required silver nitrate and prolonged compression Integumentary (Hair, Skin) Wound #1 status is Open. Original cause of wound was Blister. The wound is located on the Left,Lateral Lower Leg. The wound measures 2.2cm length x 2cm width x 0.2cm depth; 3.456cm^2 area and 0.691cm^3 volume. There is Fat Layer (Subcutaneous Tissue) Exposed exposed. There is no tunneling or undermining noted. There is a medium amount of serosanguineous drainage noted. The wound margin is distinct with the outline attached to the wound base. There is medium (34-66%) pink granulation within the wound bed. There is a medium (34-66%) amount of necrotic tissue within the wound bed including Adherent Slough. Wound #3 status is Open. Original cause of wound was Blister. The wound is located on the Right,Proximal,Lateral Lower Leg. The wound measures 3cm length x 2cm width x 0.2cm depth; 4.712cm^2 area and 0.942cm^3 volume. There is Fat Layer (Subcutaneous Tissue) Exposed exposed. There is no tunneling or undermining noted. There is a medium amount of serosanguineous drainage noted. The wound margin is well defined and not attached to the wound base. There is medium (34-66%) red, pink granulation within the wound bed. There is a medium (34-66%) amount of necrotic tissue within the wound bed including Adherent Slough. Wound #4 status is Open. Original cause of wound was Blister. The wound is located on the Right,Distal,Lateral Lower Leg. The wound measures 2.7cm length x 2.5cm width x 0.2cm depth; 5.301cm^2 area and 1.06cm^3 volume. There is Fat Layer (Subcutaneous Tissue) Exposed exposed. There is no tunneling or undermining noted. There is a medium amount of  serosanguineous drainage noted. The wound margin is distinct with the outline attached to the wound base. There is medium (34-66%) pink granulation within the wound bed. There is a medium (34-66%) amount of necrotic tissue within the wound bed including Adherent Slough. Wound #6 status is Open. Original cause of wound was Gradually Appeared. The wound is located on the Left,Proximal,Lateral Lower Leg. The wound measures 2.3cm length x 1.7cm width x 0.2cm depth; 3.071cm^2 area and 0.614cm^3 volume. There is Fat Layer (Subcutaneous Tissue) Exposed exposed. There is no tunneling or undermining noted. There is a medium amount of serosanguineous drainage noted. The wound margin is flat and intact. There is medium (34-66%) red granulation within the wound bed. There is a medium (34-66%) amount of necrotic tissue within the wound bed including Adherent Slough. 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 There was a Excisional Skin/Subcutaneous Tissue Debridement with a total area of 4.4 sq cm performed by Ricard Dillon., MD. With the following instrument(s): Curette to remove Viable and Non-Viable tissue/material. Material removed includes Subcutaneous Tissue after achieving pain control using Other (benzocaine, 20%). No specimens were taken. A time out was conducted at 15:35, prior to the start of the procedure. A Moderate amount of bleeding was controlled with Silver Nitrate. The  procedure was tolerated well with a pain level of 2 throughout and a pain level of 0 following the procedure. Post Debridement Measurements: 2.2cm length x 2cm width x 0.2cm depth; 0.691cm^3 volume. Character of Wound/Ulcer Post Debridement is improved. Severity of Tissue Post Debridement is: Fat layer exposed. Post procedure Diagnosis Wound #1: Same as Pre-Procedure Pre-procedure diagnosis of Wound #1 is a Venous Leg Ulcer located on the Left,Lateral Lower Leg . There was a Four Layer Compression Therapy Procedure by Deon Pilling, RN. Post procedure Diagnosis Wound #1: Same as Pre-Procedure Wound #3 Pre-procedure diagnosis of Wound #3 is a Venous Leg Ulcer located on the Right,Proximal,Lateral Lower Leg .Severity of Tissue Pre Debridement is: Fat layer exposed. There was a Excisional Skin/Subcutaneous Tissue Debridement with a total area of 6 sq cm performed by Ricard Dillon., MD. With the following instrument(s): Curette to remove Viable and Non-Viable tissue/material. Material removed includes Subcutaneous Tissue after achieving pain control using Other (benzocaine, 20%). No specimens were taken. A time out was conducted at 15:35, prior to the start of the procedure. A Minimum amount of bleeding was controlled with Pressure. The procedure was tolerated well with a pain level of 2 throughout and a pain level of 0 following the procedure. Post Debridement Measurements: 3cm length x 2cm width x 0.2cm depth; 0.942cm^3 volume. Character of Wound/Ulcer Post Debridement is improved. Severity of Tissue Post Debridement is: Fat layer exposed. 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 There was a Four Layer Compression Therapy Procedure by Deon Pilling, RN. Post procedure Diagnosis Wound #3: Same as Pre-Procedure Wound #4 Pre-procedure diagnosis of Wound #4 is a Venous  Leg Ulcer located on the Right,Distal,Lateral Lower Leg .Severity of Tissue Pre Debridement is: Fat layer exposed. There was a Excisional Skin/Subcutaneous Tissue Debridement with a total area of 6.75 sq cm performed by Ricard Dillon., MD. With the following instrument(s): Curette to remove Viable and Non-Viable tissue/material. Material removed includes Subcutaneous Tissue after achieving pain control using Other (benzocaine, 20%). No specimens were taken. A time out was conducted at 15:35, prior to the start of the procedure. A Minimum amount of bleeding was controlled with Pressure. The procedure was tolerated well with a pain level of 2 throughout and a pain level of 0 following the procedure. Post Debridement Measurements: 2.7cm length x 2.5cm width x 0.2cm depth; 1.06cm^3 volume. Character of Wound/Ulcer Post Debridement is improved. Severity of Tissue Post Debridement is: Fat layer exposed. 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 There was a Four Layer Compression Therapy Procedure by Deon Pilling, RN. Post procedure Diagnosis Wound #4: Same as Pre-Procedure Wound #6 Pre-procedure diagnosis of Wound #6 is a Venous Leg Ulcer located on the  Left,Proximal,Lateral Lower Leg .Severity of Tissue Pre Debridement is: Fat layer exposed. There was a Excisional Skin/Subcutaneous Tissue Debridement with a total area of 3.91 sq cm performed by Ricard Dillon., MD. With the following instrument(s): Curette to remove Viable and Non-Viable tissue/material. Material removed includes Subcutaneous Tissue after achieving pain control using Other (benzocaine, 20%). No specimens were taken. A time out was conducted at 15:35, prior to the start of the procedure. A Moderate amount of bleeding was controlled with Silver Nitrate. The procedure was tolerated well with a pain level of 2  throughout and a pain level of 0 following the procedure. Post Debridement Measurements: 2.3cm length x 1.7cm width x 0.2cm depth; 0.614cm^3 volume. Character of Wound/Ulcer Post Debridement is improved. Severity of Tissue Post Debridement is: Fat layer exposed. Post procedure Diagnosis Wound #6: Same as Pre-Procedure Pre-procedure diagnosis of Wound #6 is a Venous Leg Ulcer located on the Left,Proximal,Lateral Lower Leg . There was a Four Layer Compression Therapy Procedure by Deon Pilling, RN. Post procedure Diagnosis Wound #6: Same as Pre-Procedure Plan Follow-up Appointments: Return Appointment in 1 week. Dressing Change Frequency: Other: - change twice a week by home health. Skin Barriers/Peri-Wound Care: Moisturizing lotion TCA Cream or Ointment - liberally in clinic today mixed with lotion. Wound Cleansing: May shower with protection. - use cast protectors on the days dressings are not changed. May shower and wash wound with soap and water. - with dressing changes only. Primary Wound Dressing: Wound #1 Left,Lateral Lower Leg: Iodoflex - if home health does not have iodoflex may use iodosorb ointment. Wound #3 Right,Proximal,Lateral Lower Leg: Iodoflex - if home health does not have iodoflex may use iodosorb ointment. Wound #4 Right,Distal,Lateral Lower Leg: Iodoflex - if home health does not have iodoflex may use iodosorb ointment. Wound #6 Left,Proximal,Lateral Lower Leg: Iodoflex - if home health does not have iodoflex may use iodosorb ointment. Secondary Dressing: ABD pad Other: - pad left medial first met head for protection. Edema Control: 4 layer compression - Bilateral - ensure to wrap from foot to just below calf. Avoid standing for long periods of time Elevate legs to the level of the heart or above for 30 minutes daily and/or when sitting, a frequency of: - throughout the day. Home Health: [facility] for wound care. - [facility] 1. Reflux studies were not particularly helpful I still think this these are largely venous wounds. Certainly did not show any correctable superficial vein issues. 2. At this point I do not know that there is a reason to consider him for central venous work-up. His wounds are making progress. Still using Iodoflex under compression Electronic Signature(s) Signed: 12/21/2019 5:22:40 PM By: Linton Ham MD Entered By: Linton Ham on 12/21/2019 16:17:14 -------------------------------------------------------------------------------- SuperBill Details Patient Name: Date of Service: [name], [name] 12/21/2019 Medical Record [id number] Patient Account Number: [id number] Date of Birth/Sex: Treating RN: 1940/11/17 (79 y.o. 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-pressure chronic ulcer of other part of right lower leg with fat layer exposed Facility Procedures The patient participates with Medicare or their insurance follows  the Medicare Facility Guidelines: CPT4 Code Description Modifier Quantity [id number] - DEB SUBQ TISSUE 20 SQ CM/< 1 ICD-10 Diagnosis Description L97.822 Non-pressure chronic ulcer of  other part of left lower leg with fat layer exposed L97.812 Non-pressure chronic ulcer of other part of right lower leg with fat layer exposed The patient participates with Medicare or their insurance follows the Medicare Facility Guidelines: [id number]  - DEB SUBQ TISS EA ADDL 20CM 1 ICD-10 Diagnosis Description L97.822 Non-pressure chronic ulcer of other part of left lower leg with fat  layer exposed L97.812 Non-pressure chronic ulcer of other part of right lower leg with fat layer exposed Physician Procedures CPT4 Code Description: [id number] - WC PHYS SUBQ TISS 20 SQ CM ICD-10 Diagnosis Description L97.822 Non-pressure chronic ulcer of other part of left lower leg L97.812 Non-pressure chronic ulcer of other part of right lower leg Modifier: with fat layer exp with fat layer ex Quantity: 1 osed posed CPT4 Code Description: [id number] - WC PHYS SUBQ TISS EA ADDL 20 CM ICD-10 Diagnosis Description L97.822 Non-pressure chronic ulcer of other part of left lower leg w L97.812 Non-pressure chronic ulcer of other part of right lower leg Modifier: ith fat layer expo with fat layer exp Quantity: 1 sed osed Electronic Signature(s) Signed: 12/21/2019 5:22:40 PM By: Linton Ham MD Entered By: Linton Ham on 12/21/2019 16:20:07

## 2019-12-27 ENCOUNTER — Other Ambulatory Visit: Payer: Self-pay

## 2019-12-27 ENCOUNTER — Encounter (HOSPITAL_BASED_OUTPATIENT_CLINIC_OR_DEPARTMENT_OTHER): Payer: Medicare Other | Admitting: Internal Medicine

## 2019-12-27 DIAGNOSIS — I87333 Chronic venous hypertension (idiopathic) with ulcer and inflammation of bilateral lower extremity: Secondary | ICD-10-CM | POA: Diagnosis not present

## 2019-12-27 NOTE — Progress Notes (Signed)
ALYN, JURNEY ([id number]) Visit Report for 12/27/2019 HPI Details Patient Name: Date of Service: [place] 12/27/2019 8:00 [place] Record Number: [id number] Patient Account Number: [id number] Date of Birth/Sex: Treating RN: 09/01/1940 (79 y.o. [name], [name] Primary Care Provider: [name]., [name] Other Clinician: Referring Provider: Treating Provider/Extender: [name], [name] in Treatment: 11 History of Present Illness HPI Description: ADMISSION 10/11/2019 This is a 79 year old man with a history of a severe cardiomyopathy with an ejection fraction of about 20%, chronic renal failure stage III. He is listed as a type II diabetic in epic although the patient denies this. He also has a history of PVD. He states for the last month he has had wounds on his bilateral lower extremities that started off as blisters which denuded. He has areas on the left lateral calf and 2 on the right lateral. He has an area on the left first met head which he did not know was there he we identified this on intake. He has been using Silvadene cream provided by his primary care physician but he is complaining that this burns. 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 He was acutely ill at that time complicating GI bleed. ABIs in our clinic were 1.16 on the right and 1.13 on the left 2/25; the patient comes in with his areas on the left lateral and right lateral calf. There is also an area over the left first MTP bunion deformity. We have been using Sorbact. His edema control is fairly good 3/4; left lateral and  right lateral calf. Most of his wounds are in the same position tightly adherent nonviable debris. On the right we debrided the superior wound on the left both wounds. The area on his bunion over the left first MTP medially is I think just about closed. We have been using Sorbact without a lot of success changed to Iodoflex under compression 3/11; left lateral and right lateral calf perhaps minor improvement in the surface condition. We have been using Iodoflex. The area over the bunion of the left first MTP has closed over 3/18; left lateral and right lateral calf not much improvement. We have been using Iodoflex. Aggressive debridement last week. 3/25; left lateral and right lateral calf. We have been using Iodoflex. There is some improvement in the superior area on the right and 2 on the lateral left although there is still a lot of debris on the surface. The inferior area on the right is still a completely nonviable surface. 4/8; left lateral and right lateral calfs. Essentially mirror-image looking wounds 2 wounds on each side in close juxtaposition we have been using Iodoflex with some improvement in the very adherent fibrinous debris but not a lot. The patient has arterial studies next Wednesday morning and venous studies next Thursday morning. I have been avoiding any further aggressive debridement until we see the arterial study results. His ABIs were fairly good in this clinic and is dorsalis pedis pulses are palpable but the wound beds are pale. 4/15; left lateral and right lateral calfs. Essentially mirror-image wounds with 2 wounds on each side in close juxtaposition  but with rims of separating normal tissue. We have been using Iodoflex. The base of the wounds has been cleaning up quite nicely ARTERIAL STUDIES were done showing the patient had an ABI on the right of 1.27 with triphasic waveforms and a TBI of 0.90 on the left triphasic waveforms with an ABI of 1.28 and a TBI of 0.87. No  evidence of arterial disease VENOUS REFLUX STUDIES; were done yesterday we do not have this report yet. 4/23; VENOUS REFLUX STUDIES did not show any significant reflux right lower extremity. No evidence of a DVT He did have significant reflux in the common . femoral vein on the left but nothing else was listed as significant. He did not have a DVT. We have been using Iodoflex under compression his wounds are making progress. 4/29; improvements in the wound surface continue. We have been using Iodoflex. He has a 20% out-of-pocket co-pay for Apligraf which is unfortunate. We changed him to [employer]) Signed: 12/27/2019 5:41:26 PM By: Linton Ham MD Entered By: Linton Ham on 12/27/2019 08:47:55 -------------------------------------------------------------------------------- Physical Exam Details Patient Name: Date of Service: [place] 12/27/2019 8:00 [place] Record Number: [id number] Patient Account Number: [id number] Date of Birth/Sex: Treating RN: [birth year] (79 y.o. [name], [name] Primary Care Provider: [name]., [name] Other Clinician: Referring Provider: Treating Provider/Extender: [name], [name] in Treatment: 11 Constitutional Sitting or standing Blood Pressure is within target range for patient.. Pulse regular and within target range for patient.Marland Kitchen Respirations regular, non-labored and within target range.. Temperature is normal and within the target range for the patient.Marland Kitchen Appears in no distress. Cardiovascular Pedal pulses are palpable bilaterally. Notes Wound exam; the surface of the wound generally looks a lot better. We are still washing these off with wound cleanser and gauze they appear to be making nice progress. Still some adherent debris especially on the more distal one of the left lateral but generally all of this looks satisfactory. There is no evidence of surrounding  infection Electronic Signature(s) Signed: 12/27/2019 5:41:26 PM By: Linton Ham MD Entered By: Linton Ham on 12/27/2019 08:49:27 -------------------------------------------------------------------------------- Physician Orders Details Patient Name: Date of Service: [place] 12/27/2019 8:00 [place] Number: [id number] Patient Account Number: [id number] Date of Birth/Sex: Treating RN: 02/03/1941 (79 y.o. 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 Dressing Change Frequency Other: - change twice a week by home health. Skin Barriers/Peri-Wound Care Moisturizing lotion TCA Cream or Ointment - liberally in clinic today mixed with lotion. Wound Cleansing May shower with protection. - use cast protectors on the days dressings are not changed. May shower and wash wound with soap and water. - with dressing changes only. Primary Wound Dressing Wound #1 Left,Lateral Lower Leg Cutimed Sorbact - apply to wound beds mixed with hydrogel. Wound #3 Right,Proximal,Lateral Lower Leg Cutimed Sorbact - apply to wound beds mixed  with hydrogel. Wound #4 Right,Distal,Lateral Lower Leg Cutimed Sorbact - apply to wound beds mixed with hydrogel. Wound #6 Left,Proximal,Lateral Lower Leg Cutimed Sorbact - apply to wound beds  mixed with hydrogel. Secondary Dressing ABD pad Other: - pad left medial first met head for protection. Edema Control 4 layer compression - Bilateral - ensure to wrap from foot to just below calf. Avoid standing for long periods of time Elevate legs to the level of the heart or above for 30 minutes daily and/or when sitting, a frequency of: - throughout the day. [facility] skilled nursing for wound care. [facility] Electronic Signature(s) Signed: 12/27/2019 5:33:37 PM By: Deon Pilling Signed: 12/27/2019 5:41:26 PM By: Linton Ham MD Entered By: Deon Pilling on 12/27/2019 08:36:19 -------------------------------------------------------------------------------- Problem List Details Patient Name: Date of Service: [place] 12/27/2019 8:00 [place] Record Number: [id number] Patient Account Number: [id number] Date of Birth/Sex: Treating RN: 06/06/1941 (79 y.o. 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 [name], [name] Primary Care Provider: [name]., [name] Other Clinician: Referring Provider: Treating Provider/Extender: [name], [name] in Treatment: 11 Subjective History of Present Illness (HPI) ADMISSION 10/11/2019 This is a 79 year old man with a history of a severe cardiomyopathy with an ejection fraction of about 20%, chronic renal failure stage III. He is listed as a type II diabetic in epic although the patient denies this. He also has a history of PVD. He states for the last month he has had wounds on his bilateral lower extremities that started off as blisters which denuded. He has areas on the left lateral calf and 2 on the right lateral. He has an area on the left first met head which he did not know was there he we identified this on intake. He has been using Silvadene cream provided by his primary care physician but he is complaining that this burns. 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 He was acutely ill at that time complicating GI bleed. ABIs in our clinic were 1.16 on the right and 1.13 on the left 2/25; the patient comes in with his areas on the left lateral and right lateral calf. There is also an  area over the left first MTP bunion deformity. We have been using Sorbact. His edema  control is fairly good 3/4; left lateral and right lateral calf. Most of his wounds are in the same position tightly adherent nonviable debris. On the right we debrided the superior wound on the left both wounds. The area on his bunion over the left first MTP medially is I think just about closed. We have been using Sorbact without a lot of success changed to Iodoflex under compression 3/11; left lateral and right lateral calf perhaps minor improvement in the surface condition. We have been using Iodoflex. The area over the bunion of the left first MTP has closed over 3/18; left lateral and right lateral calf not much improvement. We have been using Iodoflex. Aggressive debridement last week. 3/25; left lateral and right lateral calf. We have been using Iodoflex. There is some improvement in the superior area on the right and 2 on the lateral left although there is still a lot of debris on the surface. The inferior area on the right is still a completely nonviable surface. 4/8; left lateral and right lateral calfs. Essentially mirror-image looking wounds 2 wounds on each side in close juxtaposition we have been using Iodoflex with some improvement in the very adherent fibrinous debris but not a lot. The patient has arterial studies next Wednesday morning and venous studies next Thursday morning. I have been avoiding any further aggressive debridement until we see the arterial study results. His ABIs were fairly good in this clinic and is dorsalis pedis pulses are palpable but the wound beds are pale. 4/15; left lateral and right lateral calfs. Essentially mirror-image wounds with 2 wounds on each side in close juxtaposition but with rims of separating normal tissue. We have been using Iodoflex. The base of the wounds has been cleaning up quite nicely ARTERIAL STUDIES were done showing the patient had an ABI on the right of 1.27 with triphasic waveforms and a TBI of 0.90 on the left triphasic  waveforms with an ABI of 1.28 and a TBI of 0.87. No evidence of arterial disease VENOUS REFLUX STUDIES; were done yesterday we do not have this report yet. 4/23; VENOUS REFLUX STUDIES did not show any significant reflux right lower extremity. No evidence of a DVT He did have significant reflux in the common . femoral vein on the left but nothing else was listed as significant. He did not have a DVT. We have been using Iodoflex under compression his wounds are making progress. 4/29; improvements in the wound surface continue. We have been using Iodoflex. He has a 20% out-of-pocket co-pay for Apligraf which is unfortunate. We changed him to Sorbact today Objective Constitutional Sitting or standing Blood Pressure is within target range for patient.. Pulse regular and within target range for patient.Marland Kitchen Respirations regular, non-labored and within target range.. Temperature is normal and within the target range for the patient.Marland Kitchen Appears in no distress. Vitals Time Taken: 7:54 AM, Height: 71 in, Weight: 198 lbs, BMI: 27.6, Temperature: 97.6 F, Pulse: 97 bpm, Respiratory Rate: 20 breaths/min, Blood Pressure: 117/69 mmHg. Cardiovascular Pedal pulses are palpable bilaterally. General Notes: Wound exam; the surface of the wound generally looks a lot better. We are still washing these off with wound cleanser and gauze they appear to be making nice progress. Still some adherent debris especially on the more distal one of the left lateral but generally all of this looks satisfactory. There is no evidence of surrounding infection Integumentary (Hair, Skin) Wound #  1 status is Open. Original cause of wound was Blister. The wound is located on the Left,Lateral Lower Leg. The wound measures 2.2cm length x 2cm width x 0.2cm depth; 3.456cm^2 area and 0.691cm^3 volume. There is Fat Layer (Subcutaneous Tissue) Exposed exposed. There is no tunneling or undermining noted. There is a medium amount of  serosanguineous drainage noted. The wound margin is distinct with the outline attached to the wound base. There is medium (34-66%) pink granulation within the wound bed. There is a medium (34-66%) amount of necrotic tissue within the wound bed including Adherent Slough. Wound #3 status is Open. Original cause of wound was Blister. The wound is located on the Right,Proximal,Lateral Lower Leg. The wound measures 3cm length x 2cm width x 0.2cm depth; 4.712cm^2 area and 0.942cm^3 volume. There is Fat Layer (Subcutaneous Tissue) Exposed exposed. There is no tunneling or undermining noted. There is a medium amount of serosanguineous drainage noted. The wound margin is well defined and not attached to the wound base. There is medium (34-66%) red, pink granulation within the wound bed. There is a medium (34-66%) amount of necrotic tissue within the wound bed including Adherent Slough. Wound #4 status is Open. Original cause of wound was Blister. The wound is located on the Right,Distal,Lateral Lower Leg. The wound measures 2.8cm length x 2.5cm width x 0.2cm depth; 5.498cm^2 area and 1.1cm^3 volume. There is Fat Layer (Subcutaneous Tissue) Exposed exposed. There is no tunneling or undermining noted. There is a medium amount of serosanguineous drainage noted. The wound margin is well defined and not attached to the wound base. There is medium (34-66%) pink granulation within the wound bed. There is a medium (34-66%) amount of necrotic tissue within the wound bed including Adherent Slough. Wound #6 status is Open. Original cause of wound was Gradually Appeared. The wound is located on the Left,Proximal,Lateral Lower Leg. The wound measures 2.3cm length x 1.6cm width x 0.2cm depth; 2.89cm^2 area and 0.578cm^3 volume. There is Fat Layer (Subcutaneous Tissue) Exposed exposed. There is no tunneling or undermining noted. There is a medium amount of serosanguineous drainage noted. The wound margin is flat and intact.  There is medium (34-66%) red granulation within the wound bed. There is a medium (34-66%) amount of necrotic tissue within the wound bed including Adherent Slough. 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 There was a Four Layer Compression Therapy Procedure with a pre-treatment ABI of 1.1 by Deon Pilling, RN. Post procedure Diagnosis Wound #1: Same as Pre-Procedure Wound #3 Pre-procedure diagnosis of Wound #3 is a Venous Leg Ulcer located on the Right,Proximal,Lateral Lower Leg . There was a Four Layer Compression Therapy Procedure with a pre-treatment ABI of 1.1 by Deon Pilling, RN. Post procedure Diagnosis Wound #3: Same as Pre-Procedure Wound #4 Pre-procedure diagnosis of Wound #4 is a Venous Leg Ulcer located on the Right,Distal,Lateral Lower Leg . There was a Four Layer Compression Therapy Procedure with a pre-treatment ABI of 1.1 by Deon Pilling, RN. Post procedure Diagnosis Wound #4: Same as Pre-Procedure Wound #6 Pre-procedure diagnosis of Wound #6 is a Venous Leg Ulcer located on the  Left,Proximal,Lateral Lower Leg . There was a Four Layer Compression Therapy Procedure with a pre-treatment ABI of 1.1 by Deon Pilling, RN. Post procedure Diagnosis Wound #6: Same as Pre-Procedure Plan Follow-up Appointments: Return Appointment in 1 week. Dressing Change Frequency: Other: - change twice a week by home health. Skin Barriers/Peri-Wound Care: Moisturizing lotion TCA Cream or Ointment - liberally in clinic today mixed  with lotion. Wound Cleansing: May shower with protection. - use cast protectors on the days dressings are not changed. May shower and wash wound with soap and water. - with dressing changes only. Primary Wound Dressing: Wound #1 Left,Lateral Lower Leg: Cutimed Sorbact - apply to wound beds mixed with hydrogel. Wound #3 Right,Proximal,Lateral Lower Leg: Cutimed Sorbact - apply to wound beds mixed with hydrogel. Wound #4 Right,Distal,Lateral Lower Leg: Cutimed Sorbact - apply to wound beds mixed with hydrogel. Wound #6 Left,Proximal,Lateral Lower Leg: Cutimed Sorbact - apply to wound beds mixed with hydrogel. Secondary Dressing: ABD pad Other: - pad left medial first met head for protection. Edema Control: 4 layer compression - Bilateral - ensure to wrap from foot to just below calf. Avoid standing for long periods of time Elevate legs to the level of the heart or above for 30 minutes daily and/or when sitting, a frequency of: - throughout the day. Home Health: [facility] for wound care. - [facility] 1. I changed him to Sorbact hopefully to allow continued improvement in the wound surface and perhaps allow for some improvement in surface area. 2. It is unfortunate he has an unaffordable co-pays for Apligraf Electronic Signature(s) Signed: 12/27/2019 5:41:26 PM By: Linton Ham MD Entered By: Linton Ham on 12/27/2019 08:50:46 -------------------------------------------------------------------------------- SuperBill Details Patient Name: Date of Service: [place] 12/27/2019 Medical Record Number: [id number] Patient Account Number: [id number] Date of Birth/Sex: Treating RN: October 13, 1940 (79 y.o. 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 Physician Procedures : CPT4 Code Description Modifier [id number] - WC PHYS LEVEL 3 - EST PT ICD-10 Diagnosis Description I87.323 Chronic venous hypertension (idiopathic) with inflammation of bilateral lower extremity L97.112 Non-pressure chronic ulcer of right thigh with  fat layer exposed L97.822 Non-pressure chronic ulcer of other part of left lower leg with fat layer exposed Quantity: 1 Electronic Signature(s) Signed: 12/27/2019 5:41:26 PM By: Linton Ham MD Entered By: Linton Ham on 12/27/2019 08:51:09

## 2020-01-03 ENCOUNTER — Other Ambulatory Visit: Payer: Self-pay

## 2020-01-03 ENCOUNTER — Encounter (HOSPITAL_BASED_OUTPATIENT_CLINIC_OR_DEPARTMENT_OTHER): Payer: Medicare Other | Attending: Internal Medicine | Admitting: Internal Medicine

## 2020-01-03 DIAGNOSIS — I509 Heart failure, unspecified: Secondary | ICD-10-CM | POA: Insufficient documentation

## 2020-01-03 DIAGNOSIS — E1151 Type 2 diabetes mellitus with diabetic peripheral angiopathy without gangrene: Secondary | ICD-10-CM | POA: Diagnosis not present

## 2020-01-03 DIAGNOSIS — L97822 Non-pressure chronic ulcer of other part of left lower leg with fat layer exposed: Secondary | ICD-10-CM | POA: Insufficient documentation

## 2020-01-03 DIAGNOSIS — Z7901 Long term (current) use of anticoagulants: Secondary | ICD-10-CM | POA: Diagnosis not present

## 2020-01-03 DIAGNOSIS — L97112 Non-pressure chronic ulcer of right thigh with fat layer exposed: Secondary | ICD-10-CM | POA: Diagnosis present

## 2020-01-03 DIAGNOSIS — I132 Hypertensive heart and chronic kidney disease with heart failure and with stage 5 chronic kidney disease, or end stage renal disease: Secondary | ICD-10-CM | POA: Diagnosis not present

## 2020-01-03 DIAGNOSIS — I890 Lymphedema, not elsewhere classified: Secondary | ICD-10-CM | POA: Diagnosis not present

## 2020-01-03 DIAGNOSIS — N186 End stage renal disease: Secondary | ICD-10-CM | POA: Diagnosis not present

## 2020-01-03 DIAGNOSIS — I429 Cardiomyopathy, unspecified: Secondary | ICD-10-CM | POA: Diagnosis not present

## 2020-01-03 DIAGNOSIS — E11621 Type 2 diabetes mellitus with foot ulcer: Secondary | ICD-10-CM | POA: Insufficient documentation

## 2020-01-03 DIAGNOSIS — L97812 Non-pressure chronic ulcer of other part of right lower leg with fat layer exposed: Secondary | ICD-10-CM | POA: Insufficient documentation

## 2020-01-03 DIAGNOSIS — E1122 Type 2 diabetes mellitus with diabetic chronic kidney disease: Secondary | ICD-10-CM | POA: Diagnosis not present

## 2020-01-03 DIAGNOSIS — L97521 Non-pressure chronic ulcer of other part of left foot limited to breakdown of skin: Secondary | ICD-10-CM | POA: Insufficient documentation

## 2020-01-03 DIAGNOSIS — D631 Anemia in chronic kidney disease: Secondary | ICD-10-CM | POA: Insufficient documentation

## 2020-01-03 NOTE — Progress Notes (Signed)
JYE, FARISS ([id number]) Visit Report for 01/03/2020 HPI Details Patient Name: Date of Service: [place] 01/03/2020 8:30 A M Medical Record Number: [id number] Patient Account Number: [id number] Date of Birth/Sex: Treating RN: 06-14-1941 (79 y.o. [name], [name] Primary Care Provider: [name]., [name] Other Clinician: Referring Provider: Treating Provider/Extender: [name], [name] in Treatment: 12 History of Present Illness HPI Description: ADMISSION 10/11/2019 This is a 79 year old man with a history of a severe cardiomyopathy with an ejection fraction of about 20%, chronic renal failure stage III. He is listed as a type II diabetic in epic although the patient denies this. He also has a history of PVD. He states for the last month he has had wounds on his bilateral lower extremities that started off as blisters which denuded. He has areas on the left lateral calf and 2 on the right lateral. He has an area on the left first met head which he did not know was there he we identified this on intake. He has been using Silvadene cream provided by his primary care physician but he is complaining that this burns. 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 He was acutely ill at that time complicating GI bleed. ABIs in our clinic were 1.16 on the right and 1.13 on the left 2/25; the patient comes in with his areas on the left lateral and right lateral calf. There is also an area over the left first MTP bunion deformity. We have been using Sorbact. His edema control is fairly good 3/4; left lateral and  right lateral calf. Most of his wounds are in the same position tightly adherent nonviable debris. On the right we debrided the superior wound on the left both wounds. The area on his bunion over the left first MTP medially is I think just about closed. We have been using Sorbact without a lot of success changed to Iodoflex under compression 3/11; left lateral and right lateral calf perhaps minor improvement in the surface condition. We have been using Iodoflex. The area over the bunion of the left first MTP has closed over 3/18; left lateral and right lateral calf not much improvement. We have been using Iodoflex. Aggressive debridement last week. 3/25; left lateral and right lateral calf. We have been using Iodoflex. There is some improvement in the superior area on the right and 2 on the lateral left although there is still a lot of debris on the surface. The inferior area on the right is still a completely nonviable surface. 4/8; left lateral and right lateral calfs. Essentially mirror-image looking wounds 2 wounds on each side in close juxtaposition we have been using Iodoflex with some improvement in the very adherent fibrinous debris but not a lot. The patient has arterial studies next Wednesday morning and venous studies next Thursday morning. I have been avoiding any further aggressive debridement until we see the arterial study results. His ABIs were fairly good in this clinic and is dorsalis pedis pulses are palpable but the wound beds are pale. 4/15; left lateral and right lateral calfs. Essentially mirror-image wounds with 2 wounds on each side in close juxtaposition  but with rims of separating normal tissue. We have been using Iodoflex. The base of the wounds has been cleaning up quite nicely ARTERIAL STUDIES were done showing the patient had an ABI on the right of 1.27 with triphasic waveforms and a TBI of 0.90 on the left triphasic waveforms with an ABI of 1.28 and a TBI of 0.87. No  evidence of arterial disease VENOUS REFLUX STUDIES; were done yesterday we do not have this report yet. 4/23; VENOUS REFLUX STUDIES did not show any significant reflux right lower extremity. No evidence of a DVT He did have significant reflux in the common . femoral vein on the left but nothing else was listed as significant. He did not have a DVT. We have been using Iodoflex under compression his wounds are making progress. 4/29; improvements in the wound surface continue. We have been using Iodoflex. He has a 20% out-of-pocket co-pay for Apligraf which is unfortunate. We changed him to Sorbact today 5/6; started on Sorbact last week. Better looking wound surface but not much change in dimensions. Electronic Signature(s) Signed: 01/03/2020 5:30:42 PM By: Linton Ham MD Entered By: Linton Ham on 01/03/2020 09:19:36 -------------------------------------------------------------------------------- Physical Exam Details Patient Name: Date of Service: [place] 01/03/2020 8:30 A M Medical Record Number: [id number] Patient Account Number: [id number] Date of Birth/Sex: Treating RN: 1941-07-07 (79 y.o. [name], [name] Primary Care Provider: [name]., [name] Other Clinician: Referring Provider: Treating Provider/Extender: [name], [name] in Treatment: 12 Constitutional Sitting or standing Blood Pressure is within target range for patient.. Pulse regular and within target range for patient.Marland Kitchen Respirations regular, non-labored and within target range.. Temperature is normal and within the target range for the patient.Marland Kitchen Appears in no distress. Cardiovascular Pedal pulses are palpable. Notes Wound exam; the surface of all 4 wounds generally look better. We use wound cleanser and gauze he does not require mechanical debridement. His edema is well controlled there is no evidence of infection Electronic Signature(s) Signed: 01/03/2020 5:30:42 PM By:  Linton Ham MD Entered By: Linton Ham on 01/03/2020 09:20:27 -------------------------------------------------------------------------------- Physician Orders Details Patient Name: Date of Service: [place] 01/03/2020 8:30 [place] Record Number: [id number] Patient Account Number: [id number] Date of Birth/Sex: Treating RN: 1941-04-13 (79 y.o. 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 Dressing Change Frequency Other: - change twice a week by home health. Skin Barriers/Peri-Wound Care Moisturizing lotion TCA Cream or Ointment - liberally in clinic today mixed with lotion. Wound Cleansing May shower with protection. - use cast protectors on the days dressings are not changed. May shower and wash wound with soap and water. - with dressing changes only. Primary Wound Dressing Wound #1 Left,Lateral Lower Leg Cutimed Sorbact - apply to wound beds mixed with hydrogel. Wound #3 Right,Proximal,Lateral Lower Leg Cutimed Sorbact - apply to wound beds mixed with hydrogel. Wound #4 Right,Distal,Lateral Lower Leg Cutimed Sorbact -  apply to wound beds mixed with hydrogel. Wound #6 Left,Proximal,Lateral Lower Leg Cutimed Sorbact - apply to wound beds mixed with hydrogel. Secondary Dressing ABD pad Other: - pad left  medial first met head for protection. Edema Control 4 layer compression - Bilateral - ensure to wrap from foot to just below calf. Avoid standing for long periods of time Elevate legs to the level of the heart or above for 30 minutes daily and/or when sitting, a frequency of: - throughout the day. [facility]. [facility] Electronic Signature(s) Signed: 01/03/2020 5:30:42 PM By: Linton Ham MD Signed: 01/03/2020 5:34:26 PM By: Deon Pilling Entered By: Deon Pilling on 01/03/2020 09:15:52 -------------------------------------------------------------------------------- Problem List Details Patient Name: Date of Service: [place] 01/03/2020 8:30 [place] Record Number: [id number] Patient Account Number: [id number] Date of Birth/Sex: Treating RN: [birth year] (79 y.o. 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/06/2020 No Yes L97.812 Non-pressure chronic ulcer of other part of right lower leg with fat layer 01/03/2020 No Yes exposed Inactive Problems ICD-10 Code Description Active Date Inactive Date L97.521 Non-pressure chronic ulcer of other part of left foot limited to breakdown of skin 10/11/2019 10/11/2019 Resolved Problems ICD-10 Code Description Active Date Resolved Date 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 [name], [name] Primary Care Provider: [name]., [name] Other Clinician: Referring Provider: Treating Provider/Extender: [name], [name] in Treatment: 12 Subjective History of Present Illness (HPI) ADMISSION 10/11/2019 This is a 79 year old man with a history of a severe cardiomyopathy with an ejection fraction of about 20%, chronic renal failure stage III. He is listed as a type II diabetic in epic although the patient denies this. He also has a history of PVD. He states for the last month he has had wounds on his bilateral lower extremities that started off as blisters which denuded. He has areas on the left lateral calf and 2 on the right lateral. He has an area on the left first met head which he did not know was there he we identified this on intake. He has been using Silvadene cream provided by his primary care physician but he is complaining that this burns. 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 He was acutely ill at that time complicating GI bleed. ABIs in our clinic were 1.16 on the right and 1.13 on the left  2/25; the patient comes in with his areas on the left lateral and right lateral calf. There is also an area over the left first MTP  bunion deformity. We have been using Sorbact. His edema control is fairly good 3/4; left lateral and right lateral calf. Most of his wounds are in the same position tightly adherent nonviable debris. On the right we debrided the superior wound on the left both wounds. The area on his bunion over the left first MTP medially is I think just about closed. We have been using Sorbact without a lot of success changed to Iodoflex under compression 3/11; left lateral and right lateral calf perhaps minor improvement in the surface condition. We have been using Iodoflex. The area over the bunion of the left first MTP has closed over 3/18; left lateral and right lateral calf not much improvement. We have been using Iodoflex. Aggressive debridement last week. 3/25; left lateral and right lateral calf. We have been using Iodoflex. There is some improvement in the superior area on the right and 2 on the lateral left although there is still a lot of debris on the surface. The inferior area on the right is still a completely nonviable surface. 4/8; left lateral and right lateral calfs. Essentially mirror-image looking wounds 2 wounds on each side in close juxtaposition we have been using Iodoflex with some improvement in the very adherent fibrinous debris but not a lot. The patient has arterial studies next Wednesday morning and venous studies next Thursday morning. I have been avoiding any further aggressive debridement until we see the arterial study results. His ABIs were fairly good in this clinic and is dorsalis pedis pulses are palpable but the wound beds are pale. 4/15; left lateral and right lateral calfs. Essentially mirror-image wounds with 2 wounds on each side in close juxtaposition but with rims of separating normal tissue. We have been using Iodoflex. The base of the wounds has been cleaning up quite nicely ARTERIAL STUDIES were done showing the patient had an ABI on the right of 1.27 with triphasic  waveforms and a TBI of 0.90 on the left triphasic waveforms with an ABI of 1.28 and a TBI of 0.87. No evidence of arterial disease VENOUS REFLUX STUDIES; were done yesterday we do not have this report yet. 4/23; VENOUS REFLUX STUDIES did not show any significant reflux right lower extremity. No evidence of a DVT He did have significant reflux in the common . femoral vein on the left but nothing else was listed as significant. He did not have a DVT. We have been using Iodoflex under compression his wounds are making progress. 4/29; improvements in the wound surface continue. We have been using Iodoflex. He has a 20% out-of-pocket co-pay for Apligraf which is unfortunate. We changed him to Sorbact today 5/6; started on Sorbact last week. Better looking wound surface but not much change in dimensions. Objective Constitutional Sitting or standing Blood Pressure is within target range for patient.. Pulse regular and within target range for patient.Marland Kitchen Respirations regular, non-labored and within target range.. Temperature is normal and within the target range for the patient.Marland Kitchen Appears in no distress. Vitals Time Taken: 8:41 AM, Height: 71 in, Weight: 198 lbs, BMI: 27.6, Temperature: 98.8 F, Pulse: 88 bpm, Respiratory Rate: 20 breaths/min, Blood Pressure: 117/67 mmHg. Cardiovascular Pedal pulses are palpable. General Notes: Wound exam; the surface of all 4 wounds generally look better. We use wound cleanser and gauze he does not require mechanical debridement. His edema is well  controlled there is no evidence of infection Integumentary (Hair, Skin) Wound #1 status is Open. Original cause of wound was Blister. The wound is located on the Left,Lateral Lower Leg. The wound measures 2.1cm length x 1.5cm width x 0.2cm depth; 2.474cm^2 area and 0.495cm^3 volume. There is Fat Layer (Subcutaneous Tissue) Exposed exposed. There is no tunneling or undermining noted. There is a medium amount of  serosanguineous drainage noted. The wound margin is distinct with the outline attached to the wound base. There is large (67-100%) pink granulation within the wound bed. There is a small (1-33%) amount of necrotic tissue within the wound bed including Adherent Slough. Wound #3 status is Open. Original cause of wound was Blister. The wound is located on the Right,Proximal,Lateral Lower Leg. The wound measures 3cm length x 2.1cm width x 0.1cm depth; 4.948cm^2 area and 0.495cm^3 volume. There is Fat Layer (Subcutaneous Tissue) Exposed exposed. There is no tunneling or undermining noted. There is a medium amount of serosanguineous drainage noted. The wound margin is well defined and not attached to the wound base. There is medium (34-66%) red, pink granulation within the wound bed. There is a medium (34-66%) amount of necrotic tissue within the wound bed including Adherent Slough. Wound #4 status is Open. Original cause of wound was Blister. The wound is located on the Right,Distal,Lateral Lower Leg. The wound measures 2.6cm length x 2.5cm width x 0.1cm depth; 5.105cm^2 area and 0.511cm^3 volume. There is Fat Layer (Subcutaneous Tissue) Exposed exposed. There is no tunneling or undermining noted. There is a medium amount of serosanguineous drainage noted. The wound margin is well defined and not attached to the wound base. There is medium (34-66%) pink granulation within the wound bed. There is a medium (34-66%) amount of necrotic tissue within the wound bed including Adherent Slough. Wound #6 status is Open. Original cause of wound was Gradually Appeared. The wound is located on the Left,Proximal,Lateral Lower Leg. The wound measures 2.2cm length x 1.5cm width x 0.2cm depth; 2.592cm^2 area and 0.518cm^3 volume. There is Fat Layer (Subcutaneous Tissue) Exposed exposed. There is no tunneling or undermining noted. There is a medium amount of serosanguineous drainage noted. The wound margin is flat and  intact. There is medium (34-66%) red granulation within the wound bed. There is a medium (34-66%) amount of necrotic tissue within the wound bed including Adherent Slough. Assessment Active Problems ICD-10 Chronic venous hypertension (idiopathic) with inflammation of bilateral lower extremity Lymphedema, not elsewhere classified Non-pressure chronic ulcer of other part of left lower leg with fat layer exposed Non-pressure chronic ulcer of other part of right lower leg with fat layer exposed Procedures Wound #1 Pre-procedure diagnosis of Wound #1 is a Venous Leg Ulcer located on the Left,Lateral Lower Leg . There was a Four Layer Compression Therapy Procedure with a pre-treatment ABI of 1.2 by Baruch Gouty, RN. Post procedure Diagnosis Wound #1: Same as Pre-Procedure Wound #3 Pre-procedure diagnosis of Wound #3 is a Venous Leg Ulcer located on the Right,Proximal,Lateral Lower Leg . There was a Four Layer Compression Therapy Procedure with a pre-treatment ABI of 1.2 by Baruch Gouty, RN. Post procedure Diagnosis Wound #3: Same as Pre-Procedure Wound #4 Pre-procedure diagnosis of Wound #4 is a Venous Leg Ulcer located on the Right,Distal,Lateral Lower Leg . There was a Four Layer Compression Therapy Procedure with a pre-treatment ABI of 1.2 by Baruch Gouty, RN. Post procedure Diagnosis Wound #4: Same as Pre-Procedure Wound #6 Pre-procedure diagnosis of Wound #6 is a Venous Leg Ulcer located on  the Left,Proximal,Lateral Lower Leg . There was a Four Layer Compression Therapy Procedure with a pre-treatment ABI of 1.2 by Baruch Gouty, RN. Post procedure Diagnosis Wound #6: Same as Pre-Procedure Plan Follow-up Appointments: Return Appointment in 1 week. Dressing Change Frequency: Other: - change twice a week by home health. Skin Barriers/Peri-Wound Care: Moisturizing lotion TCA Cream or Ointment - liberally in clinic today mixed with lotion. Wound Cleansing: May shower with  protection. - use cast protectors on the days dressings are not changed. May shower and wash wound with soap and water. - with dressing changes only. Primary Wound Dressing: Wound #1 Left,Lateral Lower Leg: Cutimed Sorbact - apply to wound beds mixed with hydrogel. Wound #3 Right,Proximal,Lateral Lower Leg: Cutimed Sorbact - apply to wound beds mixed with hydrogel. Wound #4 Right,Distal,Lateral Lower Leg: Cutimed Sorbact - apply to wound beds mixed with hydrogel. Wound #6 Left,Proximal,Lateral Lower Leg: Cutimed Sorbact - apply to wound beds mixed with hydrogel. Secondary Dressing: ABD pad Other: - pad left medial first met head for protection. Edema Control: 4 layer compression - Bilateral - ensure to wrap from foot to just below calf. Avoid standing for long periods of time Elevate legs to the level of the heart or above for 30 minutes daily and/or when sitting, a frequency of: - throughout the day. Home Health: [facility] for wound care. - [facility] 1. Continue Sorbac to both wound areas 2. He is not eligible for advanced treatment products because of an unaffordable co-pay of 20%. At some point we are probably going to need to switch to a collagen-based dressing Electronic Signature(s) Signed: 01/03/2020 9:24:27 AM By: Linton Ham MD Entered By: Linton Ham on 01/03/2020 09:24:27 -------------------------------------------------------------------------------- SuperBill Details Patient Name: Date of Service: [place] 01/03/2020 Medical Record Number: [id number] Patient Account Number: [id number] Date of Birth/Sex: Treating RN: 10/20/1940 (79 y.o. [name], [name] Primary Care Provider: [name]., [name] Other Clinician: Referring Provider: Treating Provider/Extender: [name], [name] in Treatment: 12 Diagnosis Coding ICD-10 Codes Code Description [phone] Chronic venous hypertension  (idiopathic) with inflammation of bilateral lower extremity I89.0 Lymphedema, not elsewhere classified L97.822 Non-pressure chronic ulcer of other part of left lower leg with fat layer exposed L97.812 Non-pressure chronic ulcer of other part of right lower leg with fat layer exposed Facility Procedures The patient participates with Medicare or their insurance follows the Medicare Facility Guidelines: CPT4 Description Modifier Quantity Code [id number] BILATERAL: Application of multi-layer venous compression system; leg (below knee), including ankle and 1 foot. Physician Procedures Electronic Signature(s) Signed: 01/03/2020 5:30:42 PM By: Linton Ham MD Entered By: Linton Ham on 01/03/2020 09:23:51

## 2020-01-09 NOTE — Progress Notes (Signed)
[name], [name] ([id number]) Visit Report for 12/21/2019 Arrival Information Details Patient Name: Date of Service: [name], [name] 12/21/2019 2:45 PM Medical Record Number: [id number] Patient Account Number: [id number] Date of Birth/Sex: Treating RN: 1941/07/06 (79 y.o. [name] Primary Care Yoshiko Keleher: [name]., [name] Other Clinician: Referring Othal Kubitz: Treating Alvilda Mckenna/Extender: [name], [name] in Treatment: 10 Visit Information History Since Last Visit Added or deleted any medications: No Patient Arrived: Ambulatory Had a fall or experienced change in No Arrival Time: 14:42 activities of daily living that may affect Accompanied By: self risk of falls: Transfer Assistance: None Signs or symptoms of abuse/neglect since last visito No Patient Identification Verified: Yes Hospitalized since last visit: No Secondary Verification Process Completed: Yes Implantable device outside of the clinic excluding No Patient Requires Transmission-Based Precautions: No cellular tissue based products placed in the center Patient Has Alerts: Yes since last visit: Patient Alerts: Patient on Blood Thinner Has Dressing in Place as Prescribed: Yes Pain Present Now: No Electronic Signature(s) Signed: 01/09/2020 9:10:01 AM By: Sandre Kitty Entered By: Sandre Kitty on 12/21/2019 14:45:41 -------------------------------------------------------------------------------- Compression Therapy Details Patient Name: Date of Service: Troy [name] [name] [facility] 12/21/2019 2:45 PM Medical Record Number: [id number] Patient Account Number: [id number] Date of Birth/Sex: Treating RN: October 01, 1940 (79 y.o. [name] Primary Care Dravon Nott: [name]., [name] Other Clinician: Referring Aliana Kreischer: Treating Myleen Brailsford/Extender: [name], [name] in Treatment: 10 Compression Therapy Performed for Wound Assessment: Wound #1 Left,Lateral  Lower Leg Performed By: Clinician Deon Pilling, RN Compression Type: Four Layer Post Procedure Diagnosis Same as Pre-procedure Electronic Signature(s) Signed: 12/21/2019 5:13:47 PM By: Kela Millin Entered By: Kela Millin on 12/21/2019 15:36:11 -------------------------------------------------------------------------------- Compression Therapy Details Patient Name: Date of Service: [name] [name] [facility] 12/21/2019 2:45 PM Medical Record Number: [id number] Patient Account Number: [id number] Date of Birth/Sex: Treating RN: [birth year] (79 y.o. [name] Primary Care Jasmine Maceachern: [name]., [name] Other Clinician: Referring Tniya Bowditch: Treating Alejo Beamer/Extender: [name], [name] in Treatment: 10 Compression Therapy Performed for Wound Assessment: Wound #3 Right,Proximal,Lateral Lower Leg Performed By: Clinician Deon Pilling, RN Compression Type: Four Layer Post Procedure Diagnosis Same as Pre-procedure Electronic Signature(s) Signed: 12/21/2019 5:13:47 PM By: Kela Millin Entered By: Kela Millin on 12/21/2019 15:36:11 -------------------------------------------------------------------------------- Compression Therapy Details Patient Name: Date of Service: [name] [name] [facility] 12/21/2019 2:45 PM Medical Record Number: [id number] Patient Account Number: [id number] Date of Birth/Sex: Treating RN: 18-Jul-1941 (79 y.o. [name] Primary Care Matie Dimaano: [name]., [name] Other Clinician: Referring Reyli Schroth: Treating Kayceon Oki/Extender: [name], [name] in Treatment: 10 Compression Therapy Performed for Wound Assessment: Wound #4 Right,Distal,Lateral Lower Leg Performed By: Clinician Deon Pilling, RN Compression Type: Four Layer Post Procedure Diagnosis Same as Pre-procedure Electronic Signature(s) Signed: 12/21/2019 5:13:47 PM By: Kela Millin Entered By: Kela Millin on  12/21/2019 15:36:11 -------------------------------------------------------------------------------- Compression Therapy Details Patient Name: Date of Service: [name] [name] [facility] 12/21/2019 2:45 PM Medical Record Number: [id number] Patient Account Number: [id number] Date of Birth/Sex: Treating RN: October 02, 1940 (79 y.o. [name] Primary Care Shawana Knoch: [name]., [name] Other Clinician: Referring Promise Weldin: Treating Erasto Sleight/Extender: [name], [name] in Treatment: 10 Compression Therapy Performed for Wound Assessment: Wound #6 Left,Proximal,Lateral Lower Leg Performed By: Clinician Deon Pilling, RN Compression Type: Four Layer Post Procedure Diagnosis Same as Pre-procedure  Electronic Signature(s) Signed: 12/21/2019 5:13:47 PM By: Kela Millin Signed: 12/21/2019 5:13:47 PM By: Kela Millin Entered By: Kela Millin on 12/21/2019 15:36:11 -------------------------------------------------------------------------------- Encounter Discharge Information Details Patient Name: Date of Service: Troy [name] 12/21/2019 2:45 PM Medical Record Number: [id number] Patient Account Number: [id number] Date of Birth/Sex: Treating RN: Oct 05, 1940 (79 y.o. [name] Primary Care Chima Astorino: [name]., [name] Other Clinician: Referring [place]: Treating Deunte Bledsoe/Extender: [name], [name] in Treatment: 10 Encounter Discharge Information Items Post Procedure Vitals Discharge Condition: Stable Temperature (F): 97.7 Ambulatory Status: Cane Pulse (bpm): 89 Discharge Destination: Home Respiratory Rate (breaths/min): 20 Transportation: Private Auto Blood Pressure (mmHg): 143/82 Accompanied By: self Schedule Follow-up Appointment: Yes Clinical Summary of Care: Electronic Signature(s) Signed: 12/21/2019 5:04:12 PM By: Deon Pilling Entered By: Deon Pilling on 12/21/2019  16:14:20 -------------------------------------------------------------------------------- Lower Extremity Assessment Details Patient Name: Date of Service: Troy [place] [name] [facility] 12/21/2019 2:45 PM Medical Record Number: [id number] Patient Account Number: [id number] Date of Birth/Sex: Treating RN: March 21, 1941 (79 y.o. [name] Danyia Borunda: [name]., [name] Other Clinician: Referring Jere Bostrom: Treating Rubi Tooley/Extender: [name], [name] in Treatment: 10 Edema Assessment Assessed: [Left: No] [Right: No] Edema: [Left: Yes] [Right: Yes] Calf Left: Right: Point of Measurement: 43 cm From Medial Instep 35.5 cm 38 cm Ankle Left: Right: Point of Measurement: 14 cm From Medial Instep 24 cm 24 cm Vascular Assessment Pulses: Dorsalis Pedis Palpable: [Left:Yes] [Right:Yes] Electronic Signature(s) Signed: 12/24/2019 5:33:33 PM By: Levan Hurst RN, BSN Entered By: Levan Hurst on 12/21/2019 15:10:17 -------------------------------------------------------------------------------- Multi Wound Chart Details Patient Name: Date of Service: Troy [place] 12/21/2019 2:45 PM Medical Record Number: [id number] Patient Account Number: [id number] Date of Birth/Sex: Treating RN: 11/18/1940 (79 y.o. [name] Primary Care Irvin Lizama: [name]., [name] Other Clinician: Referring Roselani Grajeda: Treating Abed Schar/Extender: [name], [name] in Treatment: 10 Vital Signs Height(in): 71 Pulse(bpm): 23 Weight(lbs): 198 Blood Pressure(mmHg): 143/82 Body Mass Index(BMI): 28 Temperature(F): 97.7 Respiratory Rate(breaths/min): 20 Photos: [1:No Photos Left, Lateral Lower Leg] [3:No Photos Right, Proximal, Lateral Lower Leg] [4:No Photos Right, Distal, Lateral Lower Leg] Wound Location: [1:Blister] [3:Blister] [4:Blister] Wounding Event: [1:Venous Leg Ulcer] [3:Venous Leg Ulcer] [4:Venous Leg Ulcer] Primary  Etiology: [1:Anemia, Congestive Heart Failure,] [3:Anemia, Congestive Heart Failure,] [4:Anemia, Congestive Heart Failure,] Comorbid History: [1:Hypertension, Peripheral Venous Disease, 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] [3:Hypertension, Peripheral Venous Disease, 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] [4:Hypertension, Peripheral Venous Disease, 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] Date Acquired: [1:10] [3:10] [4:10] Weeks of Treatment: [1:Open] [3:Open] [4:Open] Wound Status: [1:Yes] [3:No] [4:No] Clustered Wound: [1:2] [3:N/A] [4:N/A] Clustered Quantity: [1:2.2x2x0.2] [3:3x2x0.2] [4:2.7x2.5x0.2] Measurements L x W x D (cm) [1:3.456] [3:4.712] [4:5.301] A (cm) : rea [1:0.691] [3:0.942] [4:1.06] Volume (cm) : [1:94.10%] [3:4.80%] [4:43.00%] % Reduction in A [1:rea: 88.30%] [3:-90.30%] [4:-14.00%] % Reduction in Volume: [1:Full Thickness Without Exposed] [3:Full Thickness Without Exposed] [4:Full Thickness Without Exposed] Classification: [1:Support Structures Medium] [3:Support Structures Medium] [4:Support Structures Medium] Exudate A mount: [1:Serosanguineous] [3:Serosanguineous] [4:Serosanguineous] Exudate Type: [1:red, brown] [3:red, brown] [4:red, brown] Exudate Color: [1:Distinct, outline attached] [3:Well defined, not attached] [4:Distinct, outline attached] Wound Margin: [1:Medium (34-66%)] [3:Medium (34-66%)] [4:Medium (34-66%)] Granulation A mount: [1:Pink] [3:Red, Pink] [4:Pink] Granulation Quality: [1:Medium (34-66%)] [3:Medium (34-66%)] [4:Medium (34-66%)] Necrotic A mount: [1:Fat Layer (Subcutaneous Tissue)] [3:Fat Layer (Subcutaneous Tissue)] [4:Fat Layer (Subcutaneous Tissue)] Exposed Structures: [1:Exposed: Yes Fascia: No Tendon: No Muscle: No Joint:  No Bone: No Small (1-33%)] [3:Exposed: Yes Fascia: No Tendon: No Muscle: No Joint: No Bone: No Small (1-33%)] [4:Exposed: Yes Fascia: No Tendon: No Muscle: No Joint: No Bone: No Small (1-33%)] Epithelialization: [1:Debridement -  Excisional] [3:Debridement - Excisional] [4:Debridement - Excisional] Debridement: Pre-procedure Verification/Time Out 15:35 [3:15:35] [4:15:35] Taken: [1:Other] [3:Other] [4:Other] Pain Control: [1:Subcutaneous] [3:Subcutaneous] [4:Subcutaneous] Tissue Debrided: [1:Skin/Subcutaneous Tissue] [3:Skin/Subcutaneous Tissue] [4:Skin/Subcutaneous Tissue] Level: [1:4.4] [3:6] [4:6.75] Debridement A (sq cm): [1:rea Curette] [3:Curette] [4:Curette] Instrument: [1:Moderate] [3:Minimum] [4:Minimum] Bleeding: [1:Silver Nitrate] [3:Pressure] [4:Pressure] Hemostasis A chieved: [1:2] [3:2] [4:2] Procedural Pain: [1:0] [3:0] [4:0] Post Procedural Pain: [1:Procedure was tolerated well] [3:Procedure was tolerated well] [4:Procedure was tolerated well] Debridement Treatment Response: [1:2.2x2x0.2] [3:3x2x0.2] [4:2.7x2.5x0.2] Post Debridement Measurements L x W x D (cm) [1:0.691] [3:0.942] [4:1.06] Post Debridement Volume: (cm) [1:Compression Therapy] [3:Compression Therapy] [4:Compression Therapy] Procedures Performed: [1:Debridement] [3:Debridement 6 N/A] [4:Debridement N/A] Photos: [1:No Photos Left, Proximal, Lateral Lower Leg] [3:N/A N/A] [4:N/A N/A] Wound Location: [1:Gradually Appeared] [3:N/A] [4:N/A] Wounding Event: [1:Venous Leg Ulcer] [3:N/A] [4:N/A] Primary Etiology: [1:Anemia, Congestive Heart Failure,] [3:N/A] [4:N/A] Comorbid History: [1:Hypertension, Peripheral Venous Disease, End Stage Renal Disease 12/13/2019] [3:N/A] [4:N/A] Date Acquired: [1:1] [3:N/A] [4:N/A] Weeks of Treatment: [1:Open] [3:N/A] [4:N/A] Wound Status: [1:No] [3:N/A] [4:N/A] Clustered Wound: [1:N/A] [3:N/A] [4:N/A] Clustered Quantity: [1:2.3x1.7x0.2] [3:N/A] [4:N/A] Measurements L x W x D (cm) [1:3.071] [3:N/A] [4:N/A] A (cm) : rea [1:0.614] [3:N/A] [4:N/A] Volume (cm) : [1:8.00%] [3:N/A] [4:N/A] % Reduction in A [1:rea: 38.70%] [3:N/A] [4:N/A] % Reduction in Volume: [1:Full Thickness Without Exposed] [3:N/A]  [4:N/A] Classification: [1:Support Structures Medium] [3:N/A] [4:N/A] Exudate A mount: [1:Serosanguineous] [3:N/A] [4:N/A] Exudate Type: [1:red, brown] [3:N/A] [4:N/A] Exudate Color: [1:Flat and Intact] [3:N/A] [4:N/A] Wound Margin: [1:Medium (34-66%)] [3:N/A] [4:N/A] Granulation A mount: [1:Red] [3:N/A] [4:N/A] Granulation Quality: [1:Medium (34-66%)] [3:N/A] [4:N/A] Necrotic A mount: [1:Fat Layer (Subcutaneous Tissue)] [3:N/A] [4:N/A] Exposed Structures: [1:Exposed: Yes Fascia: No Tendon: No Muscle: No Joint: No Bone: No Small (1-33%)] [3:N/A] [4:N/A] Epithelialization: [1:Debridement - Excisional] [3:N/A] [4:N/A] Debridement: Pre-procedure Verification/Time Out 15:35 [3:N/A] [4:N/A] Taken: [1:Other] [3:N/A] [4:N/A] Pain Control: [1:Subcutaneous] [3:N/A] [4:N/A] Tissue Debrided: [1:Skin/Subcutaneous Tissue] [3:N/A] [4:N/A] Level: [1:3.91] [3:N/A] [4:N/A] Debridement A (sq cm): [1:rea Curette] [3:N/A] [4:N/A] Instrument: [1:Moderate] [3:N/A] [4:N/A] Bleeding: [1:Silver Nitrate] [3:N/A] [4:N/A] Hemostasis A chieved: [1:2] [3:N/A] [4:N/A] Procedural Pain: [1:0] [3:N/A] [4:N/A] Post Procedural Pain: [1:Procedure was tolerated well] [3:N/A] [4:N/A] Debridement Treatment Response: [1:2.3x1.7x0.2] [3:N/A] [4:N/A] Post Debridement Measurements L x W x D (cm) [1:0.614] [3:N/A] [4:N/A] Post Debridement Volume: (cm) [1:Compression Therapy] [3:N/A] [4:N/A] Procedures Performed: [1:Debridement] Treatment Notes Electronic Signature(s) Signed: 12/21/2019 5:13:47 PM By: Kela Millin Signed: 12/21/2019 5:22:40 PM By: Linton Ham MD Entered By: Linton Ham on 12/21/2019 16:10:23 -------------------------------------------------------------------------------- Multi-Disciplinary Care Plan Details Patient Name: Date of Service: Troy [place] 12/21/2019 2:45 PM Medical Record Number: [id number] Patient Account Number: [id number] Date of Birth/Sex: Treating RN: 1941-08-21 (79  y.o. [name] Primary Care Annaliese Saez: [name]., [name] Other Clinician: Referring Candice Lunney: Treating Siddhant Hashemi/Extender: [name], [name] in Treatment: 10 Active Inactive Abuse / Safety / Falls / Self Care Management Nursing Diagnoses: Potential for falls Goals: Patient will remain injury free related to falls Date Initiated: 10/11/2019 Target Resolution Date: 12/28/2019 Goal Status: Active Patient/caregiver will verbalize understanding of the importance to maintain current immunizations/vaccinations Date Initiated: 10/11/2019 Date Inactivated: 11/08/2019 Target Resolution Date: 12/06/2019 Goal Status: Met Interventions: Assess self care needs on admission and as needed Provide education on fall prevention Treatment Activities: Patient  referred to home care : 10/11/2019 Notes: Nutrition Nursing Diagnoses: Potential for alteratiion in Nutrition/Potential for imbalanced nutrition Goals: Patient/caregiver agrees to and verbalizes understanding of need to obtain nutritional consultation Date Initiated: 10/11/2019 Date Inactivated: 11/01/2019 Target Resolution Date: 11/09/2019 Goal Status: Met Patient/caregiver agrees to and verbalizes understanding of need to use nutritional supplements and/or vitamins as prescribed Date Initiated: 10/11/2019 Target Resolution Date: 12/28/2019 Goal Status: Active Interventions: Assess HgA1c results as ordered upon admission and as needed Provide education on nutrition Treatment Activities: Education provided on Nutrition : 12/13/2019 Obtain HgA1c : 10/11/2019 Patient referred to Primary Care Physician for further nutritional evaluation : 10/11/2019 Notes: Electronic Signature(s) Signed: 12/21/2019 5:13:47 PM By: Kela Millin Entered By: Kela Millin on 12/21/2019 15:17:59 -------------------------------------------------------------------------------- Pain Assessment Details Patient Name: Date of  Service: Troy [place] [name] [facility] 12/21/2019 2:45 PM Medical Record Number: [id number] Patient Account Number: [id number] Date of Birth/Sex: Treating RN: 1941/04/21 (79 y.o. [name] Primary Care Jaylea Plourde: [name]., [name] Other Clinician: Referring Imonie Tuch: Treating Zaleigh Bermingham/Extender: [name], [name] in Treatment: 10 Active Problems Location of Pain Severity and Description of Pain Patient Has Paino No Site Locations Pain Management and Medication Current Pain Management: Electronic Signature(s) Signed: 12/21/2019 5:13:47 PM By: Kela Millin Signed: 01/09/2020 9:10:01 AM By: Sandre Kitty Entered By: Sandre Kitty on 12/21/2019 14:46:25 -------------------------------------------------------------------------------- Patient/Caregiver Education Details Patient Name: Date of Service: Troy [place], [place] 4/23/2021andnbsp2:45 PM Medical Record Number: [id number] Patient Account Number: [id number] Date of Birth/Gender: Treating RN: 10/29/1940 (79 y.o. [name] Primary Care Physician: [name]., [name] Other Clinician: Referring Physician: Treating Physician/Extender: [name], [name] in Treatment: 10 Education Assessment Education Provided To: Patient Education Topics Provided Nutrition: Handouts: Elevated Blood Sugars: How Do They Affect Wound Healing Methods: Explain/Verbal Responses: State content correctly Safety: Handouts: Medication Safety Methods: Explain/Verbal Responses: State content correctly Electronic Signature(s) Signed: 12/21/2019 5:13:47 PM By: Kela Millin Entered By: Kela Millin on 12/21/2019 15:18:23 -------------------------------------------------------------------------------- Wound Assessment Details Patient Name: Date of Service: Troy [place] [facility] 12/21/2019 2:45 PM Medical Record Number: [id number] Patient Account Number: [id number] Date  of Birth/Sex: Treating RN: September 02, 1940 (79 y.o. [name] Primary Care Nikoli Nasser: [name]., [name] Other Clinician: Referring Brynleigh Sequeira: Treating Katrina Daddona/Extender: [name], [name] in Treatment: 10 Wound Status Wound Number: 1 Primary Venous Leg Ulcer Etiology: Wound Location: Left, Lateral Lower Leg Wound Open Wounding Event: Blister Status: Date Acquired: 09/11/2019 Comorbid Anemia, Congestive Heart Failure, Hypertension, Peripheral Weeks Of Treatment: 10 History: Venous Disease, End Stage Renal Disease Clustered Wound: Yes Photos Photo Uploaded By: Mikeal Hawthorne on 12/25/2019 13:58:42 Wound Measurements Length: (cm) 2.2 Width: (cm) 2 Depth: (cm) 0.2 Clustered Quantity: 2 Area: (cm) 3.456 Volume: (cm) 0.691 % Reduction in Area: 94.1% % Reduction in Volume: 88.3% Epithelialization: Small (1-33%) Tunneling: No Undermining: No Wound Description Classification: Full Thickness Without Exposed Support Structures Wound Margin: Distinct, outline attached Exudate Amount: Medium Exudate Type: Serosanguineous Exudate Color: red, brown Foul Odor After Cleansing: No Slough/Fibrino Yes Wound Bed Granulation Amount: Medium (34-66%) Exposed Structure Granulation Quality: Pink Fascia Exposed: No Necrotic Amount: Medium (34-66%) Fat Layer (Subcutaneous Tissue) Exposed: Yes Necrotic Quality: Adherent Slough Tendon Exposed: No Muscle Exposed: No Joint Exposed: No Bone Exposed: No Electronic Signature(s) Signed: 12/21/2019 5:13:47 PM By: Kela Millin Signed: 12/24/2019 5:33:33 PM By: Levan Hurst RN, BSN Entered By: Levan Hurst on 12/21/2019 15:10:35 --------------------------------------------------------------------------------  Wound Assessment Details Patient Name: Date of Service: Troy [place], [place] 12/21/2019 2:45 PM Medical Record Number: [id number] Patient Account Number: [id number] Date of Birth/Sex: Treating  RN: October 04, 1940 (79 y.o. [name] Primary Care Maxamus Colao: Other Clinician: [name]., [name] Referring Jacqlyn Marolf: Treating Loisann Roach/Extender: [name], [name] in Treatment: 10 Wound Status Wound Number: 3 Primary Venous Leg Ulcer Etiology: Wound Location: Right, Proximal, Lateral Lower Leg Wound Open Wounding Event: Blister Status: Date Acquired: 09/11/2019 Comorbid Anemia, Congestive Heart Failure, Hypertension, Peripheral Weeks Of Treatment: 10 History: Venous Disease, End Stage Renal Disease Clustered Wound: No Photos Photo Uploaded By: Mikeal Hawthorne on 12/25/2019 13:58:01 Wound Measurements Length: (cm) 3 Width: (cm) 2 Depth: (cm) 0.2 Area: (cm) 4.712 Volume: (cm) 0.942 % Reduction in Area: 4.8% % Reduction in Volume: -90.3% Epithelialization: Small (1-33%) Tunneling: No Undermining: No Wound Description Classification: Full Thickness Without Exposed Support Structures Wound Margin: Well defined, not attached Exudate Amount: Medium Exudate Type: Serosanguineous Exudate Color: red, brown Foul Odor After Cleansing: No Slough/Fibrino Yes Wound Bed Granulation Amount: Medium (34-66%) Exposed Structure Granulation Quality: Red, Pink Fascia Exposed: No Necrotic Amount: Medium (34-66%) Fat Layer (Subcutaneous Tissue) Exposed: Yes Necrotic Quality: Adherent Slough Tendon Exposed: No Muscle Exposed: No Joint Exposed: No Bone Exposed: No Electronic Signature(s) Signed: 12/21/2019 5:13:47 PM By: Kela Millin Signed: 12/24/2019 5:33:33 PM By: Levan Hurst RN, BSN Entered By: Levan Hurst on 12/21/2019 15:10:45 -------------------------------------------------------------------------------- Wound Assessment Details Patient Name: Date of Service: Troy [place], [place] 12/21/2019 2:45 PM Medical Record Number: [id number] Patient Account Number: [id number] Date of Birth/Sex: Treating RN: 09-16-1940 (79 y.o. [name] Primary Care Pauline Trainer: [name]., [name] Other Clinician: Referring Kaityln Kallstrom: Treating Aemilia Dedrick/Extender: [name], [name] in Treatment: 10 Wound Status Wound Number: 4 Primary Venous Leg Ulcer Etiology: Wound Location: Right, Distal, Lateral Lower Leg Wound Open Wounding Event: Blister Status: Date Acquired: 09/11/2019 Comorbid Anemia, Congestive Heart Failure, Hypertension, Peripheral Weeks Of Treatment: 10 History: Venous Disease, End Stage Renal Disease Clustered Wound: No Photos Photo Uploaded By: Mikeal Hawthorne on 12/25/2019 13:58:02 Wound Measurements Length: (cm) 2.7 Width: (cm) 2.5 Depth: (cm) 0.2 Area: (cm) 5.301 Volume: (cm) 1.06 % Reduction in Area: 43% % Reduction in Volume: -14% Epithelialization: Small (1-33%) Tunneling: No Undermining: No Wound Description Classification: Full Thickness Without Exposed Support Structures Wound Margin: Distinct, outline attached Exudate Amount: Medium Exudate Type: Serosanguineous Exudate Color: red, brown Foul Odor After Cleansing: No Slough/Fibrino Yes Wound Bed Granulation Amount: Medium (34-66%) Exposed Structure Granulation Quality: Pink Fascia Exposed: No Necrotic Amount: Medium (34-66%) Fat Layer (Subcutaneous Tissue) Exposed: Yes Necrotic Quality: Adherent Slough Tendon Exposed: No Muscle Exposed: No Joint Exposed: No Bone Exposed: No Electronic Signature(s) Signed: 12/21/2019 5:13:47 PM By: Kela Millin Signed: 12/24/2019 5:33:33 PM By: Levan Hurst RN, BSN Entered By: Levan Hurst on 12/21/2019 15:11:02 -------------------------------------------------------------------------------- Wound Assessment Details Patient Name: Date of Service: Troy [place], [place] 12/21/2019 2:45 PM Medical Record Number: [id number] Patient Account Number: [id number] Date of Birth/Sex: Treating RN: 02-22-1941 (79 y.o. [name] Primary Care Arlyn Buerkle: [name].,  [name] Other Clinician: Referring Vegas Coffin: Treating Dola Lunsford/Extender: [name], [name] in Treatment: 10 Wound Status Wound Number: 6 Primary Venous Leg Ulcer Etiology: Wound Location: Left, Proximal, Lateral Lower Leg Wound Open Wounding Event: Gradually Appeared Status: Date Acquired: 12/13/2019 Date Acquired: 12/13/2019 Comorbid Anemia, Congestive Heart Failure, Hypertension, Peripheral Weeks  Of Treatment: 1 History: Venous Disease, End Stage Renal Disease Clustered Wound: No Photos Photo Uploaded By: Mikeal Hawthorne on 12/25/2019 13:58:43 Wound Measurements Length: (cm) 2.3 Width: (cm) 1.7 Depth: (cm) 0.2 Area: (cm) 3.071 Volume: (cm) 0.614 % Reduction in Area: 8% % Reduction in Volume: 38.7% Epithelialization: Small (1-33%) Tunneling: No Undermining: No Wound Description Classification: Full Thickness Without Exposed Support Structures Wound Margin: Flat and Intact Exudate Amount: Medium Exudate Type: Serosanguineous Exudate Color: red, brown Foul Odor After Cleansing: No Slough/Fibrino Yes Wound Bed Granulation Amount: Medium (34-66%) Exposed Structure Granulation Quality: Red Fascia Exposed: No Necrotic Amount: Medium (34-66%) Fat Layer (Subcutaneous Tissue) Exposed: Yes Necrotic Quality: Adherent Slough Tendon Exposed: No Muscle Exposed: No Joint Exposed: No Bone Exposed: No Electronic Signature(s) Signed: 12/21/2019 5:13:47 PM By: Kela Millin Signed: 12/24/2019 5:33:33 PM By: Levan Hurst RN, BSN Entered By: Levan Hurst on 12/21/2019 15:11:41 -------------------------------------------------------------------------------- Vitals Details Patient Name: Date of Service: [place], [place] 12/21/2019 2:45 PM Medical Record Number: [id number] Patient Account Number: [id number] Date of Birth/Sex: Treating RN: 18-Jan-1941 (79 y.o. [name] Primary Care Donta Fuster: [name]., [name] Other  Clinician: Referring Maily Debarge: Treating Nesbit Michon/Extender: [name], [name] in Treatment: 10 Vital Signs Time Taken: 14:46 Temperature (F): 97.7 Height (in): 71 Pulse (bpm): 89 Weight (lbs): 198 Respiratory Rate (breaths/min): 20 Body Mass Index (BMI): 27.6 Blood Pressure (mmHg): 143/82 Reference Range: 80 - 120 mg / dl Electronic Signature(s) Signed: 01/09/2020 9:10:01 AM By: Sandre Kitty Entered By: Sandre Kitty on 12/21/2019 14:46:19

## 2020-01-10 ENCOUNTER — Encounter (HOSPITAL_BASED_OUTPATIENT_CLINIC_OR_DEPARTMENT_OTHER): Payer: Medicare Other | Admitting: Internal Medicine

## 2020-01-10 ENCOUNTER — Other Ambulatory Visit: Payer: Self-pay

## 2020-01-10 DIAGNOSIS — E11621 Type 2 diabetes mellitus with foot ulcer: Secondary | ICD-10-CM | POA: Diagnosis not present

## 2020-01-10 NOTE — Progress Notes (Signed)
FAOLAN, [place] ([id number]) Visit Report for 01/10/2020 Arrival Information Details Patient Name: Date of Service: [place], [place] 01/10/2020 8:30 [place] Number: [id number] Patient Account Number: [id number] Date of Birth/Sex: Treating RN: Nov 02, 1940 (79 y.o. [name] Primary Care Maxime Beckner: [name]., [name] Other Clinician: Referring Shakeria Robinette: Treating Lizmary Nader/Extender: [name], [name] in Treatment: 99 Visit Information History Since Last Visit Added or deleted any medications: No Patient Arrived: [name] Any new allergies or adverse reactions: No Arrival Time: 08:17 Had a fall or experienced change in No Accompanied By: self activities of daily living that may affect Transfer Assistance: None risk of falls: Patient Identification Verified: Yes Signs or symptoms of abuse/neglect since last visito No Secondary Verification Process Completed: Yes Hospitalized since last visit: No Patient Requires Transmission-Based Precautions: No Implantable device outside of the clinic excluding No Patient Has Alerts: Yes cellular tissue based products placed in the center Patient Alerts: Patient on Blood Thinner since last visit: Has Dressing in Place as Prescribed: Yes Has Compression in Place as Prescribed: Yes Pain Present Now: No Electronic Signature(s) Signed: 01/10/2020 12:51:17 PM By: Baruch Gouty RN, BSN Entered By: Baruch Gouty on 01/10/2020 08:17:31 -------------------------------------------------------------------------------- Compression Therapy Details Patient Name: Date of Service: [place] 01/10/2020 8:30 [place] Record Number: [id number] Patient Account Number: [id number] Date of Birth/Sex: Treating RN: 1941/07/25 (79 y.o. [name] Primary Care Jett Fukuda: [name]., [name] Other Clinician: Referring Jahlani Lorentz: Treating Yordan Martindale/Extender: [name], [name] in  Treatment: 13 Compression Therapy Performed for Wound Assessment: Wound #1 Left,Lateral Lower Leg Performed By: Clinician Baruch Gouty, RN Compression Type: Four Layer Electronic Signature(s) Signed: 01/10/2020 12:51:17 PM By: Baruch Gouty RN, BSN Entered By: Baruch Gouty on 01/10/2020 08:58:05 -------------------------------------------------------------------------------- Compression Therapy Details Patient Name: Date of Service: [place], [place] 01/10/2020 8:30 [place] Number: [id number] Patient Account Number: [id number] Date of Birth/Sex: Treating RN: 1941-03-04 (79 y.o. [name] Primary Care Obie Silos: [name]., [name] Other Clinician: Referring Rakeya Glab: Treating Rachna Schonberger/Extender: [name], [name] in Treatment: 13 Compression Therapy Performed for Wound Assessment: Wound #3 Right,Proximal,Lateral Lower Leg Performed By: Clinician Baruch Gouty, RN Compression Type: Four Layer Electronic Signature(s) Signed: 01/10/2020 12:51:17 PM By: Baruch Gouty RN, BSN Entered By: Baruch Gouty on 01/10/2020 08:58:05 -------------------------------------------------------------------------------- Encounter Discharge Information Details Patient Name: Date of Service: [place], [place] 01/10/2020 8:30 [place] Number: [id number] Patient Account Number: [id number] Date of Birth/Sex: Treating RN: Apr 17, 1941 (79 y.o. [name] Primary Care Alanah Sakuma: [name]., [name] Other Clinician: Referring Aleisha Paone: Treating Channell Quattrone/Extender: [name], [name] in Treatment: 13 Encounter Discharge Information Items Discharge Condition: Stable Ambulatory Status: Cane Discharge Destination: Home Transportation: Private Auto Accompanied By: self Schedule Follow-up Appointment: Yes Clinical Summary of Care: Patient Declined Electronic Signature(s) Signed: 01/10/2020 12:51:17 PM By:  Baruch Gouty RN, BSN Entered By: Baruch Gouty on 01/10/2020 08:59:23 -------------------------------------------------------------------------------- Patient/Caregiver Education Details Patient Name: Date of Service: [place], [place] 5/13/2021andnbsp8:30 [place] Record Number: [id number] Patient Account Number: [id number] Date of Birth/Gender: Treating RN: July 13, 1941 (79 y.o. [name] Primary Care Physician: [name]., [name] Other Clinician: Referring Physician: Treating Physician/Extender: [name], [name] in Treatment: 13 Education Assessment Education Provided To: Patient Education Topics Provided Venous:  Methods: Explain/Verbal Responses: Reinforcements needed, State content correctly Electronic Signature(s) Signed: 01/10/2020 12:51:17 PM By: Baruch Gouty RN, BSN Entered By: Baruch Gouty on 01/10/2020 08:59:09 -------------------------------------------------------------------------------- Wound Assessment Details Patient Name: Date of Service: [place], [place] 01/10/2020 8:30 [place] Record Number: [id number] Patient Account Number: [id number] Date of Birth/Sex: Treating RN: 1941-02-15 (79 y.o. [name] Primary Care Pernell Lenoir: [name]., [name] Other Clinician: Referring Azhar Knope: Treating Annabel Gibeau/Extender: [name], [name] in Treatment: 13 Wound Status Wound Number: 1 Primary Venous Leg Ulcer Etiology: Wound Location: Left, Lateral Lower Leg Wound Open Wounding Event: Blister Status: Date Acquired: 09/11/2019 Comorbid Anemia, Congestive Heart Failure, Hypertension, Peripheral Weeks Of Treatment: 13 History: Venous Disease, End Stage Renal Disease Clustered Wound: Yes Wound Measurements Length: (cm) 2.1 Width: (cm) 1.5 Depth: (cm) 0.2 Clustered Quantity: 1 Area: (cm) 2.474 Volume: (cm) 0.495 % Reduction in Area: 95.8% % Reduction in Volume:  91.6% Epithelialization: Small (1-33%) Tunneling: No Undermining: No Wound Description Classification: Full Thickness Without Exposed Support Structures Wound Margin: Distinct, outline attached Exudate Amount: Medium Exudate Type: Serosanguineous Exudate Color: red, brown Foul Odor After Cleansing: No Slough/Fibrino Yes Wound Bed Granulation Amount: Medium (34-66%) Exposed Structure Granulation Quality: Red Fascia Exposed: No Necrotic Amount: Medium (34-66%) Fat Layer (Subcutaneous Tissue) Exposed: Yes Necrotic Quality: Adherent Slough Tendon Exposed: No Muscle Exposed: No Joint Exposed: No Bone Exposed: No Treatment Notes Wound #1 (Left, Lateral Lower Leg) 2. Periwound Care Moisturizing lotion TCA Cream 3. Primary Dressing Applied Hydrogel or K-Y Jelly Other primary dressing (specifiy in notes) 4. Secondary Dressing ABD Pad Dry Gauze 6. Support Layer Applied 4 layer compression wrap Notes sorbact swab Electronic Signature(s) Signed: 01/10/2020 12:51:17 PM By: Baruch Gouty RN, BSN Entered By: Baruch Gouty on 01/10/2020 08:57:00 -------------------------------------------------------------------------------- Wound Assessment Details Patient Name: Date of Service: [place] 01/10/2020 8:30 [place] Record Number: [id number] Patient Account Number: [id number] Date of Birth/Sex: Treating RN: 08-18-1941 (79 y.o. [name] Primary Care Alfonse Garringer: [name]., [name] Other Clinician: Referring Sevon Rotert: Treating Karis Rilling/Extender: [name], [name] in Treatment: 13 Wound Status Wound Number: 3 Primary Venous Leg Ulcer Etiology: Wound Location: Right, Proximal, Lateral Lower Leg Wound Open Wounding Event: Blister Status: Date Acquired: 09/11/2019 Comorbid Anemia, Congestive Heart Failure, Hypertension, Peripheral Weeks Of Treatment: 13 History: Venous Disease, End Stage Renal Disease Clustered Wound:  No Wound Measurements Length: (cm) 3 Width: (cm) 2.1 Depth: (cm) 0.1 Area: (cm) 4.948 Volume: (cm) 0.495 % Reduction in Area: 0% % Reduction in Volume: 0% Epithelialization: Small (1-33%) Tunneling: No Undermining: No Wound Description Classification: Full Thickness Without Exposed Support Structures Wound Margin: Well defined, not attached Exudate Amount: Medium Exudate Type: Serosanguineous Exudate Color: red, brown Foul Odor After Cleansing: No Slough/Fibrino Yes Wound Bed Granulation Amount: Medium (34-66%) Exposed Structure Granulation Quality: Red, Pink Fascia Exposed: No Necrotic Amount: Medium (34-66%) Fat Layer (Subcutaneous Tissue) Exposed: Yes Necrotic Quality: Adherent Slough Tendon Exposed: No Muscle Exposed: No Joint Exposed: No Bone Exposed: No Treatment Notes Wound #3 (Right, Proximal, Lateral Lower Leg) 2. Periwound Care Moisturizing lotion TCA Cream 3. Primary Dressing Applied Hydrogel or K-Y Jelly Other primary dressing (specifiy in notes) 4. Secondary Dressing ABD Pad Dry Gauze 6. Support Layer Applied 4 layer compression wrap Notes sorbact swab Electronic Signature(s) Signed: 01/10/2020 12:51:17 PM By: Baruch Gouty RN, BSN Entered By: Baruch Gouty on 01/10/2020 08:57:27 -------------------------------------------------------------------------------- Wound Assessment Details Patient Name: Date  of Service: [place] [name] 01/10/2020 8:30 A M Medical Record Number: [id number] Patient Account Number: [id number] Date of Birth/Sex: Treating RN: [birth year] (79 y.o. [name] Primary Care Ercil Cassis: [name]., [name] Other Clinician: Referring Shakora Nordquist: Treating Jeovanny Cuadros/Extender: [name], [name] in Treatment: 13 Wound Status Wound Number: 4 Primary Venous Leg Ulcer Etiology: Wound Location: Right, Distal, Lateral Lower Leg Wound Open Wounding Event: Blister Status: Date Acquired:  09/11/2019 Comorbid Anemia, Congestive Heart Failure, Hypertension, Peripheral Weeks Of Treatment: 13 History: Venous Disease, End Stage Renal Disease Clustered Wound: No Wound Measurements Length: (cm) 2.6 Width: (cm) 2.5 Depth: (cm) 0.1 Area: (cm) 5.105 Volume: (cm) 0.511 % Reduction in Area: 45.1% % Reduction in Volume: 45.1% Epithelialization: Small (1-33%) Tunneling: No Undermining: No Wound Description Classification: Full Thickness Without Exposed Support Structures Wound Margin: Well defined, not attached Exudate Amount: Medium Exudate Type: Serosanguineous Exudate Color: red, brown Foul Odor After Cleansing: No Slough/Fibrino Yes Wound Bed Granulation Amount: Medium (34-66%) Exposed Structure Granulation Quality: Pink Fascia Exposed: No Necrotic Amount: Medium (34-66%) Fat Layer (Subcutaneous Tissue) Exposed: Yes Necrotic Quality: Adherent Slough Tendon Exposed: No Muscle Exposed: No Joint Exposed: No Bone Exposed: No Treatment Notes Wound #4 (Right, Distal, Lateral Lower Leg) 2. Periwound Care Moisturizing lotion TCA Cream 3. Primary Dressing Applied Hydrogel or K-Y Jelly Other primary dressing (specifiy in notes) 4. Secondary Dressing ABD Pad Dry Gauze 6. Support Layer Applied 4 layer compression wrap Notes sorbact swab Electronic Signature(s) Signed: 01/10/2020 12:51:17 PM By: Baruch Gouty RN, BSN Entered By: Baruch Gouty on 01/10/2020 08:57:37 -------------------------------------------------------------------------------- Wound Assessment Details Patient Name: Date of Service: [place] 01/10/2020 8:30 [place] Record Number: [id number] Patient Account Number: [id number] Date of Birth/Sex: Treating RN: 11/14/1940 (79 y.o. [name] Primary Care Teriyah Purington: [name]., [name] Other Clinician: Referring Jaquanda Wickersham: Treating Tanaysha Alkins/Extender: [name], [name] in Treatment: 13 Wound  Status Wound Number: 6 Primary Venous Leg Ulcer Etiology: Wound Location: Left, Proximal, Lateral Lower Leg Wound Open Wounding Event: Gradually Appeared Status: Date Acquired: 12/13/2019 Comorbid Anemia, Congestive Heart Failure, Hypertension, Peripheral Weeks Of Treatment: 4 History: Venous Disease, End Stage Renal Disease Clustered Wound: No Wound Measurements Length: (cm) 2.2 Width: (cm) 1.5 Depth: (cm) 0.2 Area: (cm) 2.592 Volume: (cm) 0.518 % Reduction in Area: 22.3% % Reduction in Volume: 48.3% Epithelialization: Small (1-33%) Tunneling: No Undermining: No Wound Description Classification: Full Thickness Without Exposed Support Structures Wound Margin: Flat and Intact Exudate Amount: Medium Exudate Type: Serous Exudate Color: amber Foul Odor After Cleansing: No Slough/Fibrino Yes Wound Bed Granulation Amount: Medium (34-66%) Exposed Structure Granulation Quality: Red Fascia Exposed: No Necrotic Amount: Medium (34-66%) Fat Layer (Subcutaneous Tissue) Exposed: Yes Necrotic Quality: Adherent Slough Tendon Exposed: No Muscle Exposed: No Joint Exposed: No Bone Exposed: No Treatment Notes Wound #6 (Left, Proximal, Lateral Lower Leg) 2. Periwound Care Moisturizing lotion TCA Cream 3. Primary Dressing Applied Hydrogel or K-Y Jelly Other primary dressing (specifiy in notes) 4. Secondary Dressing ABD Pad Dry Gauze 6. Support Layer Applied 4 layer compression wrap Notes sorbact swab Electronic Signature(s) Signed: 01/10/2020 12:51:17 PM By: Baruch Gouty RN, BSN Entered By: Baruch Gouty on 01/10/2020 08:57:50 -------------------------------------------------------------------------------- [place] Details Patient Name: Date of Service: [place] 01/10/2020 8:30 [place] Number: [id number] Patient Account Number: [id number] Date of Birth/Sex: Treating RN: 10-26-1940 (79 y.o. [name] Primary Care Hibah Odonnell: Carole Binning  Brooke Bonito., RO  [name] Other Clinician: Referring Brelyn Woehl: Treating Advaith Lamarque/Extender: [name], [name] in Treatment: 13 Vital Signs Time Taken: 08:17 Pulse (bpm): 89 Height (in): 71 Respiratory Rate (breaths/min): 20 Source: Stated Blood Pressure (mmHg): 115/75 Weight (lbs): 198 Reference Range: 80 - 120 mg / dl Source: Stated Body Mass Index (BMI): 27.6 Electronic Signature(s) Signed: 01/10/2020 12:51:17 PM By: Baruch Gouty RN, BSN Entered By: Baruch Gouty on 01/10/2020 08:22:18

## 2020-01-12 NOTE — Progress Notes (Signed)
[name], [name] ([id number]) Visit Report for 01/10/2020 [place] Details Patient Name: Date of Service: [name], [name] 01/10/2020 Medical Record Number: [id number] Patient Account Number: [id number] Date of Birth/Sex: Treating RN: 11-13-1940 (79 y.o. 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 Electronic Signature(s) Signed: 01/10/2020 12:51:17 PM By: Baruch Gouty RN, BSN Signed: 01/12/2020 8:06:24 AM By: Linton Ham MD Entered By: Baruch Gouty on 01/10/2020 08:59:33

## 2020-01-17 ENCOUNTER — Encounter (HOSPITAL_BASED_OUTPATIENT_CLINIC_OR_DEPARTMENT_OTHER): Payer: Medicare Other | Admitting: Internal Medicine

## 2020-01-21 NOTE — Progress Notes (Signed)
[name], [name] ([id number]) Visit Report for 12/13/2019 Arrival Information Details Patient Name: Date of Service: [place], [name] 12/13/2019 10:15 A M Medical Record Number: [id number] Patient Account Number: [id number] Date of Birth/Sex: Treating RN: 1940/09/12 (79 y.o. [name] Primary Care Provider: [name]., RO Troy Other Clinician: Referring Provider: Treating Provider/Extender: Troy Leitz., RO [name] in Treatment: 9 Visit Information History Since Last Visit Added or deleted any medications: No Patient Arrived: [name] Any new allergies or adverse reactions: No Arrival Time: 10:17 Had a fall or experienced change in No Accompanied By: self activities of daily living that may affect Transfer Assistance: None risk of falls: Patient Identification Verified: Yes Signs or symptoms of abuse/neglect since last visito No Secondary Verification Process Completed: Yes Hospitalized since last visit: No Patient Requires Transmission-Based Precautions: No Implantable device outside of the clinic excluding No Patient Has Alerts: Yes cellular tissue based products placed in the center Patient Alerts: Patient on Blood Thinner since last visit: Has Dressing in Place as Prescribed: Yes Pain Present Now: No Electronic Signature(s) Signed: 12/14/2019 4:21:34 PM By: Kela Millin Entered By: Kela Millin on 12/13/2019 10:17:27 -------------------------------------------------------------------------------- Compression Therapy Details Patient Name: Date of Service: [place] [name] [place] 12/13/2019 10:15 A M Medical Record Number: [id number] Patient Account Number: [id number] Date of Birth/Sex: Treating RN: 02/27/1941 (79 y.o. [name] Primary Care Provider: [name]., RO Troy Other Clinician: Referring Provider: Treating Provider/Extender: Troy Leitz., RO [name] in Treatment: 9 Compression Therapy Performed for  Wound Assessment: Wound #1 Left,Lateral Lower Leg Performed By: Clinician Baruch Gouty, RN Compression Type: Four Layer Post Procedure Diagnosis Same as Pre-procedure Electronic Signature(s) Signed: 12/13/2019 4:13:32 PM By: Deon Pilling Entered By: Deon Pilling on 12/13/2019 11:22:08 -------------------------------------------------------------------------------- Compression Therapy Details Patient Name: Date of Service: [place] [name] [name] 12/13/2019 10:15 A M Medical Record Number: [id number] Patient Account Number: [id number] Date of Birth/Sex: Treating RN: 01/19/1941 (79 y.o. [name] Primary Care Provider: [name]., RO Troy Other Clinician: Referring Provider: Treating Provider/Extender: Troy Leitz., RO [name] in Treatment: 9 Compression Therapy Performed for Wound Assessment: Wound #4 Right,Distal,Lateral Lower Leg Performed By: Clinician Baruch Gouty, RN Compression Type: Four Layer Post Procedure Diagnosis Same as Pre-procedure Electronic Signature(s) Signed: 12/13/2019 4:13:32 PM By: Deon Pilling Entered By: Deon Pilling on 12/13/2019 11:22:09 -------------------------------------------------------------------------------- Compression Therapy Details Patient Name: Date of Service: [place] [name] [name] 12/13/2019 10:15 A M Medical Record Number: [id number] Patient Account Number: [id number] Date of Birth/Sex: Treating RN: Mar 13, 1941 (79 y.o. [name] Primary Care Provider: [name]., RO Troy Other Clinician: Referring Provider: Treating Provider/Extender: Troy Leitz., RO [name] in Treatment: 9 Compression Therapy Performed for Wound Assessment: Wound #3 Right,Proximal,Lateral Lower Leg Performed By: Clinician Baruch Gouty, RN Compression Type: Four Layer Post Procedure Diagnosis Same as Pre-procedure Electronic Signature(s) Signed: 12/13/2019 4:13:32 PM By: Deon Pilling Entered By:  Deon Pilling on 12/13/2019 11:22:09 -------------------------------------------------------------------------------- Compression Therapy Details Patient Name: Date of Service: [name] [name] [name] 12/13/2019 10:15 A M Medical Record Number: [id number] Patient Account Number: [id number] Date of Birth/Sex: Treating RN: 08-06-1941 (79 y.o. [name], [name] Primary Care Provider: [name]., RO Troy Other Clinician: Referring Provider: Treating Provider/Extender: Troy Leitz., RO [name] in Treatment: 9 Compression Therapy Performed for Wound Assessment: Wound #6 Left,Proximal,Lateral Lower Leg Performed By: [name],  Troy Basta, RN Compression Type: Four Layer Post Procedure Diagnosis Same as Pre-procedure Electronic Signature(s) Signed: 12/13/2019 4:13:32 PM By: Deon Pilling Signed: 12/13/2019 4:13:32 PM By: Deon Pilling Entered By: Deon Pilling on 12/13/2019 11:22:09 -------------------------------------------------------------------------------- Encounter Discharge Information Details Patient Name: Date of Service: [place], [name] 12/13/2019 10:15 A M Medical Record Number: [id number] Patient Account Number: [id number] Date of Birth/Sex: Treating RN: Jun 16, 1941 (79 y.o. [name] Primary Care Provider: [name]., RO Troy Other Clinician: Referring Provider: Treating Provider/Extender: Troy Leitz., RO [name] in Treatment: 9 Encounter Discharge Information Items Discharge Condition: Stable Ambulatory Status: Cane Discharge Destination: Home Transportation: Private Auto Accompanied By: alone Schedule Follow-up Appointment: Yes Clinical Summary of Care: Patient Declined Electronic Signature(s) Signed: 12/14/2019 4:22:27 PM By: Levan Hurst RN, BSN Entered By: Levan Hurst on 12/13/2019 14:29:51 -------------------------------------------------------------------------------- Lower Extremity Assessment  Details Patient Name: Date of Service: [name] [name] 12/13/2019 10:15 A M Medical Record Number: [id number] Patient Account Number: [id number] Date of Birth/Sex: Treating RN: Jun 17, 1941 (79 y.o. [name] Primary Care Provider: [name]., RO Troy Other Clinician: Referring Provider: Treating Provider/Extender: Troy Leitz., RO [name] in Treatment: 9 Edema Assessment Assessed: [Left: No] [Right: No] Edema: [Left: Yes] [Right: Yes] Calf Left: Right: Point of Measurement: 43 cm From Medial Instep 35.5 cm 38 cm Ankle Left: Right: Point of Measurement: 14 cm From Medial Instep 24 cm 24 cm Vascular Assessment Pulses: Dorsalis Pedis Palpable: [Left:Yes] [Right:Yes] Electronic Signature(s) Signed: 12/14/2019 4:21:34 PM By: Kela Millin Entered By: Kela Millin on 12/13/2019 10:20:11 -------------------------------------------------------------------------------- Multi Wound Chart Details Patient Name: Date of Service: [place] 12/13/2019 10:15 A M Medical Record Number: [id number] Patient Account Number: [id number] Date of Birth/Sex: Treating RN: [birth year] (79 y.o. [name], [name] Primary Care Provider: [name]., RO Troy Other Clinician: Referring Provider: Treating Provider/Extender: Troy Leitz., RO [name] in Treatment: 9 Vital Signs Height(in): 71 Pulse(bpm): Weight(lbs): 198 Blood Pressure(mmHg): Body Mass Index(BMI): 28 Temperature(F): Respiratory Rate(breaths/min): 20 Photos: [1:No Photos Left, Lateral Lower Leg] [3:No Photos Right, Proximal, Lateral Lower Leg] [4:No Photos Right, Distal, Lateral Lower Leg] Wound Location: [1:Blister] [3:Blister] [4:Blister] Wounding Event: [1:Venous Leg Ulcer] [3:Venous Leg Ulcer] [4:Venous Leg Ulcer] Primary Etiology: [1:Anemia, Congestive Heart Failure,] [3:Anemia, Congestive Heart Failure,] [4:Anemia, Congestive Heart Failure,] Comorbid  History: [1:Hypertension, Peripheral Venous Disease, 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] [3:Hypertension, Peripheral Venous Disease, 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] [4:Hypertension, Peripheral Venous Disease, 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] Date Acquired: [1:9] [3:9] [4:9] Patton of Treatment: [1:Open] [3:Open] [4:Open] Wound Status: [1:Yes] [3:No] [4:No] Clustered Wound: [1:2] [3:N/A] [4:N/A] Clustered Quantity: [1:2.2x2x0.3] [3:3x2x0.3] [4:2.7x2.6x0.2] Measurements L x W x D (cm) [1:3.456] [3:4.712] [4:5.513] A (cm) : rea [1:1.037] [3:1.414] [4:1.103] Volume (cm) : [1:94.10%] [3:4.80%] [4:40.70%] % Reduction in Area: [1:82.40%] [3:-185.70%] [4:-18.60%] % Reduction in Volume: [1:Full Thickness Without Exposed] [3:Full Thickness Without Exposed] [4:Full Thickness Without Exposed] Classification: [1:Support Structures Medium] [3:Support Structures Medium] [4:Support Structures Medium] Exudate Amount: [1:Serosanguineous] [3:Serosanguineous] [4:Serosanguineous] Exudate Type: [1:red, brown] [3:red, brown] [4:red, brown] Exudate Color: [1:Distinct, outline attached] [3:Well defined, not attached] [4:Distinct, outline attached] Wound Margin: [1:Medium (34-66%)] [3:Medium (34-66%)] [4:Small (1-33%)] Granulation Amount: [1:Pink] [3:Red, Pink] [4:Pink] Granulation Quality: [1:Medium (34-66%)] [3:Medium (34-66%)] [4:Large (67-100%)] Necrotic Amount: [1:Fat Layer (Subcutaneous Tissue)] [3:Fat Layer (Subcutaneous Tissue)] [4:Fat Layer (Subcutaneous Tissue)] Exposed Structures: [1:Exposed: Yes Fascia: No Tendon: No Muscle: No Joint: No Bone: No None] [3:Exposed: Yes Fascia:  No Tendon: No Muscle: No Joint: No Bone: No Small (1-33%)] [4:Exposed: Yes Fascia: No Tendon: No Muscle: No Joint: No Bone: No Small (1-33%)] Epithelialization: [1:Compression Therapy] [3:Compression Therapy] [4:Compression [place] Wound Number: 6 N/A N/A Photos: No Photos N/A N/A Left, Proximal, Lateral Lower Leg N/A  N/A Wound Location: Gradually Appeared N/A N/A Wounding Event: Venous Leg Ulcer N/A N/A Primary Etiology: Anemia, Congestive Heart Failure, N/A N/A Comorbid History: Hypertension, Peripheral Venous Disease, End Stage Renal Disease 12/13/2019 N/A N/A Date Acquired: 0 N/A N/A Patton of Treatment: Open N/A N/A Wound Status: No N/A N/A Clustered Wound: N/A N/A N/A Clustered Quantity: 2.5x1.7x0.3 N/A N/A Measurements L x W x D (cm) 3.338 N/A N/A A (cm) : rea 1.001 N/A N/A Volume (cm) : 0.00% N/A N/A % Reduction in Area: 0.00% N/A N/A % Reduction in Volume: Full Thickness Without Exposed N/A N/A Classification: Support Structures Medium N/A N/A Exudate Amount: Serosanguineous N/A N/A Exudate Type: red, brown N/A N/A Exudate Color: Flat and Intact N/A N/A Wound Margin: Medium (34-66%) N/A N/A Granulation Amount: Red N/A N/A Granulation Quality: Medium (34-66%) N/A N/A Necrotic Amount: Fat Layer (Subcutaneous Tissue) N/A N/A Exposed Structures: Exposed: Yes Fascia: No Tendon: No Muscle: No Joint: No Bone: No None N/A N/A Epithelialization: Compression Therapy N/A N/A Procedures Performed: Treatment Notes Electronic Signature(s) Signed: 12/15/2019 6:55:41 AM By: Linton Ham MD Signed: 01/21/2020 1:31:05 PM By: Deon Pilling Entered By: Linton Ham on 12/13/2019 13:00:20 -------------------------------------------------------------------------------- Multi-Disciplinary Care Plan Details Patient Name: Date of Service: [place], Troy Shaw 12/13/2019 10:15 A M Medical Record Number: [id number] Patient Account Number: [id number] Date of Birth/Sex: Treating RN: 08-Oct-1940 (79 y.o. [name], [name] Primary Care Provider: [name]., RO Troy Other Clinician: Referring Provider: Treating Provider/Extender: Troy Leitz., RO [name] in Treatment: 9 Active Inactive Abuse / Safety / Falls / Self Care Management Nursing  Diagnoses: Potential for falls Goals: Patient will remain injury free related to falls Date Initiated: 10/11/2019 Target Resolution Date: 12/28/2019 Goal Status: Active Patient/caregiver will verbalize understanding of the importance to maintain current immunizations/vaccinations Date Initiated: 10/11/2019 Date Inactivated: 11/08/2019 Target Resolution Date: 12/06/2019 Goal Status: Met Interventions: Assess self care needs on admission and as needed Provide education on fall prevention Treatment Activities: Patient referred to home care : 10/11/2019 Notes: Nutrition Nursing Diagnoses: Potential for alteratiion in Nutrition/Potential for imbalanced nutrition Goals: Patient/caregiver agrees to and verbalizes understanding of need to obtain nutritional consultation Date Initiated: 10/11/2019 Date Inactivated: 11/01/2019 Target Resolution Date: 11/09/2019 Goal Status: Met Patient/caregiver agrees to and verbalizes understanding of need to use nutritional supplements and/or vitamins as prescribed Date Initiated: 10/11/2019 Target Resolution Date: 12/28/2019 Goal Status: Active Interventions: Assess HgA1c results as ordered upon admission and as needed Provide education on nutrition Treatment Activities: Education provided on Nutrition : 12/06/2019 Obtain HgA1c : 10/11/2019 Patient referred to Primary Care Physician for further nutritional evaluation : 10/11/2019 Notes: Electronic Signature(s) Signed: 12/13/2019 4:13:32 PM By: Deon Pilling Signed: 01/21/2020 1:31:05 PM By: Deon Pilling Entered By: Deon Pilling on 12/13/2019 11:21:37 -------------------------------------------------------------------------------- Pain Assessment Details Patient Name: Date of Service: [place] 12/13/2019 10:15 A M Medical Record Number: [id number] Patient Account Number: [id number] Date of Birth/Sex: Treating RN: 11-Sep-1940 (79 y.o. [name], [name] Primary Care Provider: [name]., RO  Troy Other Clinician: Referring Provider: Treating Provider/Extender: Troy Leitz., RO [name] in Treatment: 9 Active Problems Location of Pain Severity and Description of Pain Patient Has  Paino Yes Site Locations Rate the pain. Current Pain Level: 10 Pain Management and Medication Current Pain Management: Electronic Signature(s) Signed: 12/18/2019 1:29:28 PM By: Sandre Kitty Signed: 01/21/2020 1:31:05 PM By: Deon Pilling Entered By: Sandre Kitty on 12/13/2019 10:26:55 -------------------------------------------------------------------------------- Patient/Caregiver Education Details Patient Name: Date of Service: [place], [place] 4/15/2021andnbsp10:15 [place] Record Number: [id number] Patient Account Number: [id number] Date of Birth/Gender: Treating RN: 03/09/1941 (79 y.o. [name] Primary Care Physician: Troy Pear., RO Troy Other Clinician: Referring Physician: Treating Physician/Extender: Troy Leitz., RO [name] in Treatment: 9 Education Assessment Education Provided To: Patient Education Topics Provided Nutrition: Handouts: Nutrition Methods: Explain/Verbal Responses: Reinforcements needed Electronic Signature(s) Signed: 12/13/2019 4:13:32 PM By: Deon Pilling Entered By: Deon Pilling on 12/13/2019 11:21:51 -------------------------------------------------------------------------------- Wound Assessment Details Patient Name: Date of Service: [place] [name] [facility] 12/13/2019 10:15 A M Medical Record Number: [id number] Patient Account Number: [id number] Date of Birth/Sex: Treating RN: 10-28-1940 (79 y.o. [name], [name] Primary Care Provider: [name]., RO Troy Other Clinician: Referring Provider: Treating Provider/Extender: Troy Leitz., RO [name] in Treatment: 9 Wound Status Wound Number: 1 Primary Venous Leg Ulcer Etiology: Wound Location: Left, Lateral Lower Leg Wound  Open Wounding Event: Blister Status: Date Acquired: 09/11/2019 Comorbid Anemia, Congestive Heart Failure, Hypertension, Peripheral Patton Of Treatment: 9 History: Venous Disease, End Stage Renal Disease Clustered Wound: Yes Photos Wound Measurements Length: (cm) 2.2 Width: (cm) Depth: (cm) Clustered Quantity: Area: (cm) Volume: (cm) % Reduction in Area: 94.1% 2 % Reduction in Volume: 82.4% 0.3 Epithelialization: None 2 Tunneling: No 3.456 Undermining: No 1.037 Wound Description Classification: Full Thickness Without Exposed Support Structures Wound Margin: Distinct, outline attached Exudate Amount: Medium Exudate Type: Serosanguineous Exudate Color: red, brown Foul Odor After Cleansing: No Slough/Fibrino Yes Wound Bed Granulation Amount: Medium (34-66%) Exposed Structure Granulation Quality: Pink Fascia Exposed: No Necrotic Amount: Medium (34-66%) Fat Layer (Subcutaneous Tissue) Exposed: Yes Necrotic Quality: Adherent Slough Tendon Exposed: No Muscle Exposed: No Joint Exposed: No Bone Exposed: No Electronic Signature(s) Signed: 12/18/2019 1:29:28 PM By: Sandre Kitty Signed: 01/21/2020 1:31:05 PM By: Deon Pilling Entered By: Sandre Kitty on 12/13/2019 14:53:30 -------------------------------------------------------------------------------- Wound Assessment Details Patient Name: Date of Service: [place] 12/13/2019 10:15 A M Medical Record Number: [id number] Patient Account Number: [id number] Date of Birth/Sex: Treating RN: [birth year] (79 y.o. [name], [name] Primary Care Provider: [name]., RO Troy Other Clinician: Referring Provider: Treating Provider/Extender: Troy Leitz., RO [name] in Treatment: 9 Wound Status Wound Number: 3 Primary Venous Leg Ulcer Etiology: Wound Location: Right, Proximal, Lateral Lower Leg Wound Open Wounding Event: Blister Status: Date Acquired: 09/11/2019 Comorbid Anemia, Congestive  Heart Failure, Hypertension, Peripheral Patton Of Treatment: 9 History: Venous Disease, End Stage Renal Disease Clustered Wound: No Photos Wound Measurements Length: (cm) 3 Width: (cm) 2 Depth: (cm) 0.3 Area: (cm) 4.712 Volume: (cm) 1.414 Wound Description Classification: Full Thickness Without Exposed Support Structu Wound Margin: Well defined, not attached Exudate Amount: Medium Exudate Type: Serosanguineous Exudate Color: red, brown Foul Odor After Cleansing: Slough/Fibrino % Reduction in Area: 4.8% % Reduction in Volume: -185.7% Epithelialization: Small (1-33%) Tunneling: No Undermining: No res No Yes Wound Bed Granulation Amount: Medium (34-66%) Exposed Structure Granulation Quality: Red, Pink Fascia Exposed: No Necrotic Amount: Medium (34-66%) Fat Layer (Subcutaneous Tissue) Exposed: Yes Necrotic Quality: Adherent Slough Tendon Exposed: No Muscle Exposed: No Joint Exposed: No Bone Exposed: No Electronic Signature(s) Signed:  12/18/2019 1:29:28 PM By: Sandre Kitty Signed: 01/21/2020 1:31:05 PM By: Deon Pilling Entered By: Sandre Kitty on 12/13/2019 14:52:21 -------------------------------------------------------------------------------- Wound Assessment Details Patient Name: Date of Service: [place] 12/13/2019 10:15 A M Medical Record Number: [id number] Patient Account Number: [id number] Date of Birth/Sex: Treating RN: Feb 23, 1941 (79 y.o. [name], [name] Primary Care Provider: [name]., RO Troy Other Clinician: Referring Provider: Treating Provider/Extender: Troy Leitz., RO [name] in Treatment: 9 Wound Status Wound Number: 4 Primary Venous Leg Ulcer Etiology: Wound Location: Right, Distal, Lateral Lower Leg Wound Open Wounding Event: Blister Status: Date Acquired: 09/11/2019 Comorbid Anemia, Congestive Heart Failure, Hypertension, Peripheral Patton Of Treatment: 9 History: Venous Disease, End Stage Renal  Disease Clustered Wound: No Photos Wound Measurements Length: (cm) 2.7 Width: (cm) 2.6 Depth: (cm) 0.2 Area: (cm) 5.513 Volume: (cm) 1.103 % Reduction in Area: 40.7% % Reduction in Volume: -18.6% Epithelialization: Small (1-33%) Tunneling: No Undermining: No Wound Description Classification: Full Thickness Without Exposed Support Structures Wound Margin: Distinct, outline attached Exudate Amount: Medium Exudate Type: Serosanguineous Exudate Color: red, brown Wound Bed Granulation Amount: Small (1-33%) Granulation Quality: Pink Necrotic Amount: Large (67-100%) Necrotic Quality: Adherent Slough Foul Odor After Cleansing: No Slough/Fibrino Yes Exposed Structure Fascia Exposed: No Fat Layer (Subcutaneous Tissue) Exposed: Yes Tendon Exposed: No Muscle Exposed: No Joint Exposed: No Bone Exposed: No Electronic Signature(s) Signed: 12/18/2019 1:29:28 PM By: Sandre Kitty Signed: 01/21/2020 1:31:05 PM By: Deon Pilling Entered By: Sandre Kitty on 12/13/2019 14:52:45 -------------------------------------------------------------------------------- Wound Assessment Details Patient Name: Date of Service: [place], [place] 12/13/2019 10:15 A M Medical Record Number: [id number] Patient Account Number: [id number] Date of Birth/Sex: Treating RN: 05-22-1941 (79 y.o. [name], [name] Primary Care Provider: [name]., RO Troy Other Clinician: Referring Provider: Treating Provider/Extender: Troy Leitz., RO [name] in Treatment: 9 Wound Status Wound Number: 6 Primary Venous Leg Ulcer Etiology: Wound Location: Left, Proximal, Lateral Lower Leg Wound Open Wounding Event: Gradually Appeared Status: Date Acquired: 12/13/2019 Comorbid Anemia, Congestive Heart Failure, Hypertension, Peripheral Patton Of Treatment: 0 History: Venous Disease, End Stage Renal Disease Clustered Wound: No Photos Wound Measurements Length: (cm) 2.5 Width: (cm) 1.7 Depth:  (cm) 0.3 Area: (cm) 3.338 Volume: (cm) 1.001 % Reduction in Area: 0% % Reduction in Volume: 0% Epithelialization: None Tunneling: No Undermining: No Wound Description Classification: Full Thickness Without Exposed Support Structures Wound Margin: Flat and Intact Exudate Amount: Medium Exudate Type: Serosanguineous Exudate Color: red, brown Foul Odor After Cleansing: No Slough/Fibrino Yes Wound Bed Granulation Amount: Medium (34-66%) Exposed Structure Granulation Quality: Red Fascia Exposed: No Necrotic Amount: Medium (34-66%) Fat Layer (Subcutaneous Tissue) Exposed: Yes Necrotic Quality: Adherent Slough Tendon Exposed: No Muscle Exposed: No Joint Exposed: No Bone Exposed: No Electronic Signature(s) Signed: 12/18/2019 1:29:28 PM By: Sandre Kitty Signed: 01/21/2020 1:31:05 PM By: Deon Pilling Entered By: Sandre Kitty on 12/13/2019 14:53:11 -------------------------------------------------------------------------------- Vitals Details Patient Name: Date of Service: [place] 12/13/2019 10:15 A M Medical Record Number: [id number] Patient Account Number: [id number] Date of Birth/Sex: Treating RN: 01/05/1941 (79 y.o. [name] Primary Care Provider: [name]., RO Troy Other Clinician: Referring Provider: Treating Provider/Extender: Troy Leitz., RO [name] in Treatment: 9 Vital Signs Time Taken: 10:15 Respiratory Rate (breaths/min): 20 Height (in): 71 Reference Range: 80 - 120 mg / dl Weight (lbs): 198 Body Mass Index (BMI): 27.6 Electronic Signature(s) Signed: 12/14/2019 4:21:34 PM By: Kela Millin Entered  ByKela Millin on 12/13/2019 10:18:22

## 2020-01-21 NOTE — Progress Notes (Signed)
[name], [name] ([id number]) Visit Report for 01/03/2020 Arrival Information Details Patient Name: Date of Service: [name] 01/03/2020 8:30 [place] Record Number: [id number] Patient Account Number: [id number] Date of Birth/Sex: Treating RN: 1941-06-30 (79 y.o. [name] Primary Care [name]: [name]., [name] Other Clinician: Referring [name]: Treating [name]/Extender: [name], [name] in Treatment: 12 Visit Information History Since Last Visit Added or deleted any medications: No Patient Arrived: Cane Any new allergies or adverse reactions: No Arrival Time: 08:41 Had a fall or experienced change in No Accompanied By: self activities of daily living that may affect Transfer Assistance: None risk of falls: Patient Identification Verified: Yes Signs or symptoms of abuse/neglect since last visito No Secondary Verification Process Completed: Yes Hospitalized since last visit: No Patient Requires Transmission-Based Precautions: No Implantable device outside of the clinic excluding No Patient Has Alerts: Yes cellular tissue based products placed in the center Patient Alerts: Patient on Blood Thinner since last visit: Has Dressing in Place as Prescribed: Yes Pain Present Now: No Electronic Signature(s) Signed: 01/08/2020 8:52:48 AM By: Sandre Kitty Entered By: Sandre Kitty on 01/03/2020 08:41:37 -------------------------------------------------------------------------------- Compression Therapy Details Patient Name: Date of Service: [name] 01/03/2020 8:30 [place] Record Number: [id number] Patient Account Number: [id number] Date of Birth/Sex: Treating RN: March 05, 1941 (79 y.o. [name] Primary Care [name]: [name]., [name] Other Clinician: Referring Javaria Knapke: Treating [name], [name] in Treatment: 12 Compression Therapy Performed for Wound  Assessment: Wound #1 Left,Lateral Lower Leg Performed By: Clinician Baruch Gouty, RN Compression Type: Four Layer Pre Treatment ABI: 1.2 Post Procedure Diagnosis Same as Pre-procedure Electronic Signature(s) Signed: 01/03/2020 5:34:26 PM By: Deon Pilling Entered By: Deon Pilling on 01/03/2020 09:15:25 -------------------------------------------------------------------------------- Compression Therapy Details Patient Name: Date of Service: [name] 01/03/2020 8:30 [place] Record Number: [id number] Patient Account Number: [id number] Date of Birth/Sex: Treating RN: [birth year] (79 y.o. [name] Primary Care Vernestine Brodhead: [name]., [name] Other Clinician: Referring Brysun Eschmann: Treating [name]/Extender: [name], [name] in Treatment: 12 Compression Therapy Performed for Wound Assessment: Wound #3 Right,Proximal,Lateral Lower Leg Performed By: Clinician Baruch Gouty, RN Compression Type: Four Layer Pre Treatment ABI: 1.2 Post Procedure Diagnosis Same as Pre-procedure Electronic Signature(s) Signed: 01/03/2020 5:34:26 PM By: Deon Pilling Entered By: Deon Pilling on 01/03/2020 09:15:25 -------------------------------------------------------------------------------- Compression Therapy Details Patient Name: Date of Service: [name] 01/03/2020 8:30 [place] Record Number: [id number] Patient Account Number: [id number] Date of Birth/Sex: Treating RN: 14-Mar-1941 (79 y.o. [name] Primary Care Syrianna Schillaci: [name]., [name] Other Clinician: Referring Denyla Cortese: Treating [name]/Extender: [name], [name] in Treatment: 12 Compression Therapy Performed for Wound Assessment: Wound #4 Right,Distal,Lateral Lower Leg Performed By: Clinician Baruch Gouty, RN Compression Type: Four Layer Pre Treatment ABI: 1.2 Post Procedure Diagnosis Same as Pre-procedure Electronic  Signature(s) Signed: 01/03/2020 5:34:26 PM By: Deon Pilling Entered By: Deon Pilling on 01/03/2020 09:15:25 -------------------------------------------------------------------------------- Compression Therapy Details Patient Name: Date of Service: [name] 01/03/2020 8:30 [place] Record Number: [id number] Patient Account Number: [id number] Date of Birth/Sex: Treating RN: Sep 12, 1940 (79 y.o. [name], [name] Primary Care Yaiden Yang: [name]., [name] Other Clinician: Referring Amanii Snethen: Treating [name]/Extender: [name], [name] in Treatment: 12 Compression Therapy Performed  for Wound Assessment: Wound #6 Left,Proximal,Lateral Lower Leg Performed By: Clinician Baruch Gouty, RN Compression Type: Four Layer Pre Treatment ABI: 1.2 Post Procedure Diagnosis Same as Pre-procedure Electronic Signature(s) Signed: 01/03/2020 5:34:26 PM By: Deon Pilling Entered By: Deon Pilling on 01/03/2020 09:15:25 -------------------------------------------------------------------------------- Encounter Discharge Information Details Patient Name: Date of Service: [name] 01/03/2020 8:30 [place] Record Number: [id number] Patient Account Number: [id number] Date of Birth/Sex: Treating RN: 21-Jul-1941 (79 y.o. [name] Primary Care Yasmeen Manka: [name]., [name] Other Clinician: Referring Taelyn Broecker: Treating [name]/Extender: [name], [name] in Treatment: 12 Encounter Discharge Information Items Discharge Condition: Stable Ambulatory Status: Cane Discharge Destination: Home Transportation: Private Auto Accompanied By: self Schedule Follow-up Appointment: Yes Clinical Summary of Care: Patient Declined Electronic Signature(s) Signed: 01/03/2020 5:01:19 PM By: Baruch Gouty RN, BSN Entered By: Baruch Gouty on 01/03/2020  09:41:41 -------------------------------------------------------------------------------- Lower Extremity Assessment Details Patient Name: Date of Service: Troy [place] [name] 01/03/2020 8:30 [place] Record Number: [id number] Patient Account Number: [id number] Date of Birth/Sex: Treating RN: August 24, 1941 (79 y.o. [name] Gearld Kerstein: [name]., [name] Other Clinician: Referring Sigmond Patalano: Treating Taiki Buckwalter/Extender: [name], [name] in Treatment: 12 Edema Assessment Assessed: [Left: No] [Right: No] Edema: [Left: Yes] [Right: Yes] Calf Left: Right: Point of Measurement: 43 cm From Medial Instep 38 cm 37 cm Ankle Left: Right: Point of Measurement: 14 cm From Medial Instep 25 cm 25 cm Vascular Assessment Pulses: Dorsalis Pedis Palpable: [Left:Yes] [Right:Yes] Electronic Signature(s) Signed: 01/03/2020 5:16:23 PM By: Levan Hurst RN, BSN Entered By: Levan Hurst on 01/03/2020 09:07:38 -------------------------------------------------------------------------------- Multi Wound Chart Details Patient Name: Date of Service: Troy [place] 01/03/2020 8:30 A M Medical Record Number: [id number] Patient Account Number: [id number] Date of Birth/Sex: Treating RN: February 05, 1941 (79 y.o. [name], [name] Primary Care Vendela Troung: [name]., [name] Other Clinician: Referring Dorian Duval: Treating Hyland Mollenkopf/Extender: [name], [name] in Treatment: 12 Vital Signs Height(in): 71 Pulse(bpm): 88 Weight(lbs): 198 Blood Pressure(mmHg): 117/67 Body Mass Index(BMI): 28 Temperature(F): 98.8 Respiratory Rate(breaths/min): 20 Photos: [1:No Photos Left, Lateral Lower Leg] [3:No Photos Right, Proximal, Lateral Lower Leg] [4:No Photos Right, Distal, Lateral Lower Leg] Wound Location: [1:Blister] [3:Blister] [4:Blister] Wounding Event: [1:Venous Leg Ulcer] [3:Venous Leg Ulcer] [4:Venous Leg Ulcer] Primary Etiology:  [1:Anemia, Congestive Heart Failure,] [3:Anemia, Congestive Heart Failure,] [4:Anemia, Congestive Heart Failure,] Comorbid History: [1:Hypertension, Peripheral Venous Disease, 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] [3:Hypertension, Peripheral Venous Disease, 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] [4:Hypertension, Peripheral Venous Disease, 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] Date Acquired: [1:12] [3:12] [4:12] Weeks of Treatment: [1:Open] [3:Open] [4:Open] Wound Status: [1:Yes] [3:No] [4:No] Clustered Wound: [1:1] [3:N/A] [4:N/A] Clustered Quantity: [1:2.1x1.5x0.2] [3:3x2.1x0.1] [4:2.6x2.5x0.1] Measurements L x W x D (cm) [1:2.474] [3:4.948] [4:5.105] A (cm) : rea [1:0.495] [3:0.495] [4:0.511] Volume (cm) : [1:95.80%] [3:0.00%] [4:45.10%] % Reduction in Area: [1:91.60%] [3:0.00%] [4:45.10%] % Reduction in Volume: [1:Full Thickness Without Exposed] [3:Full Thickness Without Exposed] [4:Full Thickness Without Exposed] Classification: [1:Support Structures Medium] [3:Support Structures Medium] [4:Support Structures Medium] Exudate Amount: [1:Serosanguineous] [3:Serosanguineous] [4:Serosanguineous] Exudate Type: [1:red, brown] [3:red, brown] [4:red, brown] Exudate Color: [1:Distinct, outline attached] [3:Well defined, not attached] [4:Well defined, not attached] Wound Margin: [1:Large (67-100%)] [3:Medium (34-66%)] [4:Medium (34-66%)] Granulation Amount: [1:Pink] [3:Red, Pink] [4:Pink] Granulation Quality: [1:Small (1-33%)] [3:Medium (34-66%)] [4:Medium (34-66%)] Necrotic Amount: [1:Fat Layer (Subcutaneous Tissue)] [3:Fat Layer (Subcutaneous Tissue)] [4:Fat Layer (Subcutaneous Tissue)] Exposed Structures: [1:Exposed:  Yes Fascia: No Tendon: No Muscle: No Joint: No Bone: No Small (1-33%)] [3:Exposed: Yes Fascia: No Tendon: No Muscle: No Joint: No Bone: No Small (1-33%)] [4:Exposed: Yes Fascia: No Tendon: No Muscle: No Joint: No Bone: No Small (1-33%)] Epithelialization: [1:Compression Therapy]  [3:Compression Therapy] [4:Compression Therapy] Wound Number: 6 N/A N/A Photos: No Photos N/A N/A Left, Proximal, Lateral Lower Leg N/A N/A Wound Location: Gradually Appeared N/A N/A Wounding Event: Venous Leg Ulcer N/A N/A Primary Etiology: Anemia, Congestive Heart Failure, N/A N/A Comorbid History: Hypertension, Peripheral Venous Disease, End Stage Renal Disease 12/13/2019 N/A N/A Date Acquired: 3 N/A N/A Weeks of Treatment: Open N/A N/A Wound Status: No N/A N/A Clustered Wound: N/A N/A N/A Clustered Quantity: 2.2x1.5x0.2 N/A N/A Measurements L x W x D (cm) 2.592 N/A N/A A (cm) : rea 0.518 N/A N/A Volume (cm) : 22.30% N/A N/A % Reduction in Area: 48.30% N/A N/A % Reduction in Volume: Full Thickness Without Exposed N/A N/A Classification: Support Structures Medium N/A N/A Exudate Amount: Serosanguineous N/A N/A Exudate Type: red, brown N/A N/A Exudate Color: Flat and Intact N/A N/A Wound Margin: Medium (34-66%) N/A N/A Granulation Amount: Red N/A N/A Granulation Quality: Medium (34-66%) N/A N/A Necrotic Amount: Fat Layer (Subcutaneous Tissue) N/A N/A Exposed Structures: Exposed: Yes Fascia: No Tendon: No Muscle: No Joint: No Bone: No Small (1-33%) N/A N/A Epithelialization: Compression Therapy N/A N/A Procedures Performed: Treatment Notes Electronic Signature(s) Signed: 01/03/2020 5:30:42 PM By: Linton Ham MD Signed: 01/21/2020 1:34:25 PM By: Deon Pilling Entered By: Linton Ham on 01/03/2020 09:19:00 -------------------------------------------------------------------------------- Multi-Disciplinary Care Plan Details Patient Name: Date of Service: Troy [place], [place] 01/03/2020 8:30 A M Medical Record Number: [id number] Patient Account Number: [id number] Date of Birth/Sex: Treating RN: 11-19-1940 (79 y.o. [name], [name] Primary Care Provider: [name]., [name] Other Clinician: Referring Provider: Treating Provider/Extender:  [name], [name] in Treatment: 12 Active Inactive Abuse / Safety / Falls / Self Care Management Nursing Diagnoses: Potential for falls Goals: Patient will remain injury free related to falls Date Initiated: 10/11/2019 Target Resolution Date: 01/25/2020 Goal Status: Active Patient/caregiver will verbalize understanding of the importance to maintain current immunizations/vaccinations Date Initiated: 10/11/2019 Date Inactivated: 11/08/2019 Target Resolution Date: 12/06/2019 Goal Status: Met Interventions: Assess self care needs on admission and as needed Provide education on fall prevention Treatment Activities: Patient referred to home care : 10/11/2019 Notes: Nutrition Nursing Diagnoses: Potential for alteratiion in Nutrition/Potential for imbalanced nutrition Goals: Patient/caregiver agrees to and verbalizes understanding of need to obtain nutritional consultation Date Initiated: 10/11/2019 Date Inactivated: 11/01/2019 Target Resolution Date: 11/09/2019 Goal Status: Met Patient/caregiver agrees to and verbalizes understanding of need to use nutritional supplements and/or vitamins as prescribed Date Initiated: 10/11/2019 Target Resolution Date: 01/25/2020 Goal Status: Active Interventions: Assess HgA1c results as ordered upon admission and as needed Provide education on nutrition Treatment Activities: Education provided on Nutrition : 12/27/2019 Obtain HgA1c : 10/11/2019 Patient referred to Primary Care Physician for further nutritional evaluation : 10/11/2019 Notes: Electronic Signature(s) Signed: 01/03/2020 5:34:26 PM By: Deon Pilling Signed: 01/21/2020 1:34:25 PM By: Deon Pilling Entered By: Deon Pilling on 01/03/2020 08:35:09 -------------------------------------------------------------------------------- Pain Assessment Details Patient Name: Date of Service: Troy [place] Lyle Greer 01/03/2020 8:30 A M Medical Record Number: [id number] Patient Account  Number: [id number] Date of Birth/Sex: Treating RN: 06/20/1941 (79 y.o. [name], [name] Primary Care Provider: [name]., [name] Other Clinician: Referring Provider: Treating Provider/Extender: [name], RO  [name] in Treatment: 12 Active Problems Location of Pain Severity and Description of Pain Patient Has Paino No Site Locations Pain Management and Medication Current Pain Management: Electronic Signature(s) Signed: 01/08/2020 8:52:48 AM By: Sandre Kitty Signed: 01/21/2020 1:34:25 PM By: Deon Pilling Signed: 01/21/2020 1:34:25 PM By: Deon Pilling Entered By: Sandre Kitty on 01/03/2020 08:42:12 -------------------------------------------------------------------------------- Patient/Caregiver Education Details Patient Name: Date of Service: Troy [place], [place] 5/6/2021andnbsp8:30 [place] Record Number: [id number] Patient Account Number: [id number] Date of Birth/Gender: Treating RN: 04-Feb-1941 (79 y.o. [name] Primary Care Physician: [name]., [name] Other Clinician: Referring Physician: Treating Physician/Extender: [name], [name] in Treatment: 12 Education Assessment Education Provided To: Patient Education Topics Provided Nutrition: Handouts: Nutrition Methods: Explain/Verbal Responses: Reinforcements needed Electronic Signature(s) Signed: 01/03/2020 5:34:26 PM By: Deon Pilling Entered By: Deon Pilling on 01/03/2020 08:35:18 -------------------------------------------------------------------------------- Wound Assessment Details Patient Name: Date of Service: Troy [name] Patton [facility] 01/03/2020 8:30 [place] Record Number: [id number] Patient Account Number: [id number] Date of Birth/Sex: Treating RN: [birth year] (79 y.o. [name], [name] Primary Care Provider: [name]., [name] Other Clinician: Referring Provider: Treating Provider/Extender: [name], [name]  in Treatment: 12 Wound Status Wound Number: 1 Primary Venous Leg Ulcer Etiology: Wound Location: Left, Lateral Lower Leg Wound Open Wounding Event: Blister Status: Date Acquired: 09/11/2019 Comorbid Anemia, Congestive Heart Failure, Hypertension, Peripheral Weeks Of Treatment: 12 History: Venous Disease, End Stage Renal Disease Clustered Wound: Yes Photos Photo Uploaded By: Mikeal Hawthorne on 01/04/2020 10:52:58 Wound Measurements Length: (cm) Width: (cm) Depth: (cm) Clustered Quantity: Area: (cm) Volume: (cm) 2.1 % Reduction in Area: 95.8% 1.5 % Reduction in Volume: 91.6% 0.2 Epithelialization: Small (1-33%) 1 Tunneling: No 2.474 Undermining: No 0.495 Wound Description Classification: Full Thickness Without Exposed Support Structures Wound Margin: Distinct, outline attached Exudate Amount: Medium Exudate Type: Serosanguineous Exudate Color: red, brown Foul Odor After Cleansing: No Slough/Fibrino Yes Wound Bed Granulation Amount: Large (67-100%) Exposed Structure Granulation Quality: Pink Fascia Exposed: No Necrotic Amount: Small (1-33%) Fat Layer (Subcutaneous Tissue) Exposed: Yes Necrotic Quality: Adherent Slough Tendon Exposed: No Muscle Exposed: No Joint Exposed: No Bone Exposed: No Electronic Signature(s) Signed: 01/03/2020 5:16:23 PM By: Levan Hurst RN, BSN Signed: 01/21/2020 1:34:25 PM By: Deon Pilling Entered By: Levan Hurst on 01/03/2020 09:08:10 -------------------------------------------------------------------------------- Wound Assessment Details Patient Name: Date of Service: Troy [name], [place] 01/03/2020 8:30 A M Medical Record Number: [id number] Patient Account Number: [id number] Date of Birth/Sex: Treating RN: 1941/07/10 (79 y.o. [name], [name] Primary Care Provider: [name]., [name] Other Clinician: Referring Provider: Treating Provider/Extender: [name], [name] in Treatment: 12 Wound Status Wound  Number: 3 Primary Venous Leg Ulcer Etiology: Wound Location: Right, Proximal, Lateral Lower Leg Wound Open Wounding Event: Blister Status: Date Acquired: 09/11/2019 Comorbid Anemia, Congestive Heart Failure, Hypertension, Peripheral Weeks Of Treatment: 12 History: Venous Disease, End Stage Renal Disease Clustered Wound: No Photos Photo Uploaded By: Mikeal Hawthorne on 01/04/2020 10:52:27 Wound Measurements Length: (cm) 3 Width: (cm) 2.1 Depth: (cm) 0.1 Area: (cm) 4.948 Volume: (cm) 0.495 % Reduction in Area: 0% % Reduction in Volume: 0% Epithelialization: Small (1-33%) Tunneling: No Undermining: No Wound Description Classification: Full Thickness Without Exposed Support Structures Wound Margin: Well defined, not attached Exudate Amount: Medium Exudate Type: Serosanguineous Exudate Color: red, brown Foul Odor After Cleansing: No Slough/Fibrino Yes Wound Bed Granulation Amount: Medium (34-66%) Exposed Structure Granulation  Quality: Red, Pink Fascia Exposed: No Necrotic Amount: Medium (34-66%) Fat Layer (Subcutaneous Tissue) Exposed: Yes Necrotic Quality: Adherent Slough Tendon Exposed: No Muscle Exposed: No Joint Exposed: No Bone Exposed: No Electronic Signature(s) Signed: 01/03/2020 5:16:23 PM By: Levan Hurst RN, BSN Signed: 01/21/2020 1:34:25 PM By: Deon Pilling Entered By: Levan Hurst on 01/03/2020 09:08:31 -------------------------------------------------------------------------------- Wound Assessment Details Patient Name: Date of Service: Troy [place] 01/03/2020 8:30 [place] Record Number: [id number] Patient Account Number: [id number] Date of Birth/Sex: Treating RN: March 06, 1941 (79 y.o. [name], [name] Primary Care Provider: [name]., [name] Other Clinician: Referring Provider: Treating Provider/Extender: [name], [name] in Treatment: 12 Wound Status Wound Number: 4 Primary Venous Leg Ulcer Etiology: Wound  Location: Right, Distal, Lateral Lower Leg Wound Open Wounding Event: Blister Status: Date Acquired: 09/11/2019 Comorbid Anemia, Congestive Heart Failure, Hypertension, Peripheral Weeks Of Treatment: 12 History: Venous Disease, End Stage Renal Disease Clustered Wound: No Photos Photo Uploaded By: Mikeal Hawthorne on 01/04/2020 10:52:28 Wound Measurements Length: (cm) 2.6 Width: (cm) 2.5 Depth: (cm) 0.1 Area: (cm) 5.105 Volume: (cm) 0.511 % Reduction in Area: 45.1% % Reduction in Volume: 45.1% Epithelialization: Small (1-33%) Tunneling: No Undermining: No Wound Description Classification: Full Thickness Without Exposed Support Structures Wound Margin: Well defined, not attached Exudate Amount: Medium Exudate Type: Serosanguineous Exudate Color: red, brown Foul Odor After Cleansing: No Slough/Fibrino Yes Wound Bed Granulation Amount: Medium (34-66%) Exposed Structure Granulation Quality: Pink Fascia Exposed: No Necrotic Amount: Medium (34-66%) Fat Layer (Subcutaneous Tissue) Exposed: Yes Necrotic Quality: Adherent Slough Tendon Exposed: No Muscle Exposed: No Joint Exposed: No Bone Exposed: No Electronic Signature(s) Signed: 01/03/2020 5:16:23 PM By: Levan Hurst RN, BSN Signed: 01/21/2020 1:34:25 PM By: Deon Pilling Entered By: Levan Hurst on 01/03/2020 09:08:45 -------------------------------------------------------------------------------- Wound Assessment Details Patient Name: Date of Service: Troy [place], [place] 01/03/2020 8:30 A M Medical Record Number: [id number] Patient Account Number: [id number] Date of Birth/Sex: Treating RN: [birth year] (79 y.o. [name], [name] Primary Care Provider: [name]., [name] Other Clinician: Referring Provider: Treating Provider/Extender: [name], [name] in Treatment: 12 Wound Status Wound Number: 6 Primary Venous Leg Ulcer Etiology: Wound Location: Left, Proximal, Lateral Lower Leg Wound  Open Wounding Event: Gradually Appeared Status: Date Acquired: 12/13/2019 Comorbid Anemia, Congestive Heart Failure, Hypertension, Peripheral Weeks Of Treatment: 3 History: Venous Disease, End Stage Renal Disease Clustered Wound: No Photos Photo Uploaded By: Mikeal Hawthorne on 01/04/2020 10:52:59 Wound Measurements Length: (cm) 2.2 Width: (cm) 1.5 Depth: (cm) 0.2 Area: (cm) 2.592 Volume: (cm) 0.518 % Reduction in Area: 22.3% % Reduction in Volume: 48.3% Epithelialization: Small (1-33%) Tunneling: No Undermining: No Wound Description Classification: Full Thickness Without Exposed Support Structures Wound Margin: Flat and Intact Exudate Amount: Medium Exudate Type: Serosanguineous Exudate Color: red, brown Foul Odor After Cleansing: No Slough/Fibrino Yes Wound Bed Granulation Amount: Medium (34-66%) Exposed Structure Granulation Quality: Red Fascia Exposed: No Necrotic Amount: Medium (34-66%) Fat Layer (Subcutaneous Tissue) Exposed: Yes Necrotic Quality: Adherent Slough Tendon Exposed: No Muscle Exposed: No Joint Exposed: No Bone Exposed: No Electronic Signature(s) Signed: 01/03/2020 5:16:23 PM By: Levan Hurst RN, BSN Signed: 01/21/2020 1:34:25 PM By: Deon Pilling Entered By: Levan Hurst on 01/03/2020 09:09:00 -------------------------------------------------------------------------------- Vitals Details Patient Name: Date of Service: Troy [place], [place] 01/03/2020 8:30 A M Medical Record Number: [id number] Patient Account Number: [id number] Date of Birth/Sex: Treating RN: 1941/04/18 (79 y.o. [name] Primary Care Provider: Carola Frost  Buel Ream., [name] Other Clinician: Referring Renesme Kerrigan: Treating Marice Guidone/Extender: [name], [name] in Treatment: 12 Vital Signs Time Taken: 08:41 Temperature (F): 98.8 Height (in): 71 Pulse (bpm): 88 Weight (lbs): 198 Respiratory Rate (breaths/min): 20 Body Mass Index (BMI): 27.6 Blood  Pressure (mmHg): 117/67 Reference Range: 80 - 120 mg / dl Electronic Signature(s) Signed: 01/08/2020 8:52:48 AM By: Sandre Kitty Entered By: Sandre Kitty on 01/03/2020 08:42:03

## 2020-01-21 NOTE — Progress Notes (Signed)
[name], [name] ([id number]) Visit Report for 12/27/2019 Arrival Information Details Patient Name: Date of Service: [place] 12/27/2019 8:00 [place] Record Number: [id number] Patient Account Number: [id number] Date of Birth/Sex: Treating RN: 1940-12-17 (79 y.o. [name] Treshun Wold: [name]., [name] Other Clinician: Referring [place]: Treating Corney Knighton/Extender: [name], [name] in Treatment: 11 Visit Information History Since Last Visit Added or deleted any medications: No Patient Arrived: Ambulatory Any new allergies or adverse reactions: No Arrival Time: 07:53 Had a fall or experienced change in No Accompanied By: self activities of daily living that may affect Transfer Assistance: None risk of falls: Patient Identification Verified: Yes Signs or symptoms of abuse/neglect since last visito No Secondary Verification Process Completed: Yes Hospitalized since last visit: No Patient Requires Transmission-Based Precautions: No Implantable device outside of the clinic excluding No Patient Has Alerts: Yes cellular tissue based products placed in the center Patient Alerts: Patient on Blood Thinner since last visit: Has Dressing in Place as Prescribed: Yes Pain Present Now: No Electronic Signature(s) Signed: 01/09/2020 9:09:16 AM By: Sandre Kitty Entered By: Sandre Kitty on 12/27/2019 07:54:16 -------------------------------------------------------------------------------- Compression Therapy Details Patient Name: Date of Service: [place] [name] [place] 12/27/2019 8:00 [place] Record Number: [id number] Patient Account Number: [id number] Date of Birth/Sex: Treating RN: 1940-09-16 (79 y.o. [name] Lakecia Deschamps: [name]., [name] Other Clinician: Referring Nozomi Mettler: Treating Micaella Gitto/Extender: [name], [name] in Treatment: 11 Compression Therapy Performed for  Wound Assessment: Wound #1 Left,Lateral Lower Leg Performed By: Clinician Deon Pilling, RN Compression Type: Four Layer Pre Treatment ABI: 1.1 Post Procedure Diagnosis Same as Pre-procedure Electronic Signature(s) Signed: 12/27/2019 5:33:37 PM By: Deon Pilling Entered By: Deon Pilling on 12/27/2019 08:36:50 -------------------------------------------------------------------------------- Compression Therapy Details Patient Name: Date of Service: [name] [facility] 12/27/2019 8:00 A M Medical Record Number: [id number] Patient Account Number: [id number] Date of Birth/Sex: Treating RN: 1941/01/24 (79 y.o. [name] Emmalin Jaquess: [name]., [name] Other Clinician: Referring Lilliane Sposito: Treating Emmette Katt/Extender: [name], [name] in Treatment: 11 Compression Therapy Performed for Wound Assessment: Wound #4 Right,Distal,Lateral Lower Leg Performed By: Clinician Deon Pilling, RN Compression Type: Four Layer Pre Treatment ABI: 1.1 Post Procedure Diagnosis Same as Pre-procedure Electronic Signature(s) Signed: 12/27/2019 5:33:37 PM By: Deon Pilling Entered By: Deon Pilling on 12/27/2019 08:36:50 -------------------------------------------------------------------------------- Compression Therapy Details Patient Name: Date of Service: [name] [facility] 12/27/2019 8:00 A M Medical Record Number: [id number] Patient Account Number: [id number] Date of Birth/Sex: Treating RN: 07-20-1941 (79 y.o. [name] Landri Dorsainvil: [name]., [name] Other Clinician: Referring Vastie Douty: Treating Damauri Minion/Extender: [name], [name] in Treatment: 11 Compression Therapy Performed for Wound Assessment: Wound #3 Right,Proximal,Lateral Lower Leg Performed By: Clinician Deon Pilling, RN Compression Type: Four Layer Pre Treatment ABI: 1.1 Post Procedure Diagnosis Same as Pre-procedure Electronic  Signature(s) Signed: 12/27/2019 5:33:37 PM By: Deon Pilling Entered By: Deon Pilling on 12/27/2019 08:36:50 -------------------------------------------------------------------------------- Compression Therapy Details Patient Name: Date of Service: [name] [facility] 12/27/2019 8:00 A M Medical Record Number: [id number] Patient Account Number: [id number] Date of Birth/Sex: Treating RN: 1941-04-20 (79 y.o. [name], [name] Primary Care Hyatt Capobianco: [name]., [name] Other Clinician: Referring Emila Steinhauser: Treating Savanna Dooley/Extender: [name], [name] in Treatment: 11 Compression Therapy Performed  for Wound Assessment: Wound #6 Left,Proximal,Lateral Lower Leg Performed By: Clinician Deon Pilling, RN Compression Type: Four Layer Pre Treatment ABI: 1.1 Post Procedure Diagnosis Same as Pre-procedure Electronic Signature(s) Signed: 12/27/2019 5:33:37 PM By: Deon Pilling Entered By: Deon Pilling on 12/27/2019 08:36:50 -------------------------------------------------------------------------------- Encounter Discharge Information Details Patient Name: Date of Service: [place] 12/27/2019 8:00 A M Medical Record Number: [id number] Patient Account Number: [id number] Date of Birth/Sex: Treating RN: 05-26-1941 (79 y.o. [name] Primary Care Melynda Krzywicki: [name]., [name] Other Clinician: Referring Alphonsus Doyel: Treating Rajiv Parlato/Extender: [name], [name] in Treatment: 11 Encounter Discharge Information Items Discharge Condition: Stable Ambulatory Status: Cane Discharge Destination: Home Transportation: Private Auto Accompanied By: self Schedule Follow-up Appointment: Yes Clinical Summary of Care: Patient Declined Electronic Signature(s) Signed: 12/27/2019 5:04:41 PM By: Baruch Gouty RN, BSN Entered By: Baruch Gouty on 12/27/2019  09:04:35 -------------------------------------------------------------------------------- Lower Extremity Assessment Details Patient Name: Date of Service: [place] 12/27/2019 8:00 [place] Record Number: [id number] Patient Account Number: [id number] Date of Birth/Sex: Treating RN: 07-26-1941 (79 y.o. [name] Primary Care Dino Borntreger: [name]., [name] Other Clinician: Referring Mariena Meares: Treating Geremy Rister/Extender: [name], [name] in Treatment: 11 Edema Assessment Assessed: [Left: No] [Right: No] Edema: [Left: Yes] [Right: Yes] Calf Left: Right: Point of Measurement: 43 cm From Medial Instep 38 cm 37 cm Ankle Left: Right: Point of Measurement: 14 cm From Medial Instep 25 cm 25 cm Vascular Assessment Pulses: Dorsalis Pedis Palpable: [Left:Yes] [Right:Yes] Electronic Signature(s) Signed: 12/27/2019 5:19:50 PM By: Kela Millin Entered By: Kela Millin on 12/27/2019 08:05:51 -------------------------------------------------------------------------------- Multi Wound Chart Details Patient Name: Date of Service: [place] 12/27/2019 8:00 A M Medical Record Number: [id number] Patient Account Number: [id number] Date of Birth/Sex: Treating RN: 12/29/1940 (79 y.o. [name], [name] Primary Care [place] Spees: [name]., [name] Other Clinician: Referring Trinten Boudoin: Treating Alonie Gazzola/Extender: [name], [name] in Treatment: 11 Vital Signs Height(in): 71 Pulse(bpm): 97 Weight(lbs): 198 Blood Pressure(mmHg): 117/69 Body Mass Index(BMI): 28 Temperature(F): 97.6 Respiratory Rate(breaths/min): 20 Photos: [1:No Photos Left, Lateral Lower Leg] [3:No Photos Right, Proximal, Lateral Lower Leg] [4:No Photos Right, Distal, Lateral Lower Leg] Wound Location: [1:Blister] [3:Blister] [4:Blister] Wounding Event: [1:Venous Leg Ulcer] [3:Venous Leg Ulcer] [4:Venous Leg Ulcer] Primary Etiology:  [1:Anemia, Congestive Heart Failure,] [3:Anemia, Congestive Heart Failure,] [4:Anemia, Congestive Heart Failure,] Comorbid History: [1:Hypertension, Peripheral Venous Disease, 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] [3:Hypertension, Peripheral Venous Disease, 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] [4:Hypertension, Peripheral Venous Disease, 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] Date Acquired: [1:11] [3:11] [4:11] Weeks of Treatment: [1:Open] [3:Open] [4:Open] Wound Status: [1:Yes] [3:No] [4:No] Clustered Wound: [1:2] [3:N/A] [4:N/A] Clustered Quantity: [1:2.2x2x0.2] [3:3x2x0.2] [4:2.8x2.5x0.2] Measurements L x W x D (cm) [1:3.456] [3:4.712] [4:5.498] A (cm) : rea [1:0.691] [3:0.942] [4:1.1] Volume (cm) : [1:94.10%] [3:4.80%] [4:40.90%] % Reduction in Area: [1:88.30%] [3:-90.30%] [4:-18.30%] % Reduction in Volume: [1:Full Thickness Without Exposed] [3:Full Thickness Without Exposed] [4:Full Thickness Without Exposed] Classification: [1:Support Structures Medium] [3:Support Structures Medium] [4:Support Structures Medium] Exudate Amount: [1:Serosanguineous] [3:Serosanguineous] [4:Serosanguineous] Exudate Type: [1:red, brown] [3:red, brown] [4:red, brown] Exudate Color: [1:Distinct, outline attached] [3:Well defined, not attached] [4:Well defined, not attached] Wound Margin: [1:Medium (34-66%)] [3:Medium (34-66%)] [4:Medium (34-66%)] Granulation Amount: [1:Pink] [3:Red, Pink] [4:Pink] Granulation Quality: [1:Medium (34-66%)] [3:Medium (34-66%)] [4:Medium (34-66%)] Necrotic Amount: [1:Fat Layer (Subcutaneous Tissue)] [3:Fat Layer (Subcutaneous Tissue)] [4:Fat Layer (Subcutaneous Tissue)] Exposed Structures: [1:Exposed: Yes Fascia:  No Tendon: No Muscle: No Joint: No Bone: No Small (1-33%)] [3:Exposed: Yes Fascia: No Tendon: No Muscle: No Joint: No Bone: No Small (1-33%)] [4:Exposed: Yes Fascia: No Tendon: No Muscle: No Joint: No Bone: No Small (1-33%)] Epithelialization: [1:Compression Therapy]  [3:Compression Therapy] [4:Compression Therapy] Wound Number: 6 N/A N/A Photos: No Photos N/A N/A Left, Proximal, Lateral Lower Leg N/A N/A Wound Location: Gradually Appeared N/A N/A Wounding Event: Venous Leg Ulcer N/A N/A Primary Etiology: Anemia, Congestive Heart Failure, N/A N/A Comorbid History: Hypertension, Peripheral Venous Disease, End Stage Renal Disease 12/13/2019 N/A N/A Date Acquired: 2 N/A N/A Weeks of Treatment: Open N/A N/A Wound Status: No N/A N/A Clustered Wound: N/A N/A N/A Clustered Quantity: 2.3x1.6x0.2 N/A N/A Measurements L x W x D (cm) 2.89 N/A N/A A (cm) : rea 0.578 N/A N/A Volume (cm) : 13.40% N/A N/A % Reduction in Area: 42.30% N/A N/A % Reduction in Volume: Full Thickness Without Exposed N/A N/A Classification: Support Structures Medium N/A N/A Exudate Amount: Serosanguineous N/A N/A Exudate Type: red, brown N/A N/A Exudate Color: Flat and Intact N/A N/A Wound Margin: Medium (34-66%) N/A N/A Granulation Amount: Red N/A N/A Granulation Quality: Medium (34-66%) N/A N/A Necrotic Amount: Fat Layer (Subcutaneous Tissue) N/A N/A Exposed Structures: Exposed: Yes Fascia: No Tendon: No Muscle: No Joint: No Bone: No Small (1-33%) N/A N/A Epithelialization: Compression Therapy N/A N/A Procedures Performed: Treatment Notes Wound #1 (Left, Lateral Lower Leg) 2. Periwound Care Moisturizing lotion TCA Cream 3. Primary Dressing Applied Hydrogel or K-Y Jelly Other primary dressing (specifiy in notes) 4. Secondary Dressing ABD Pad Dry Gauze 6. Support Layer Applied 4 layer compression wrap Notes sorbact swab Wound #3 (Right, Proximal, Lateral Lower Leg) 2. Periwound Care Moisturizing lotion TCA Cream 3. Primary Dressing Applied Hydrogel or K-Y Jelly Other primary dressing (specifiy in notes) 4. Secondary Dressing ABD Pad Dry Gauze 6. Support Layer Applied 4 layer compression wrap Notes sorbact swab Wound #4 (Right,  Distal, Lateral Lower Leg) 2. Periwound Care Moisturizing lotion TCA Cream 3. Primary Dressing Applied Hydrogel or K-Y Jelly Other primary dressing (specifiy in notes) 4. Secondary Dressing ABD Pad Dry Gauze 6. Support Layer Applied 4 layer compression wrap Notes sorbact swab Wound #6 (Left, Proximal, Lateral Lower Leg) 2. Periwound Care Moisturizing lotion TCA Cream 3. Primary Dressing Applied Hydrogel or K-Y Jelly Other primary dressing (specifiy in notes) 4. Secondary Dressing ABD Pad Dry Gauze 6. Support Layer Applied 4 layer compression wrap Notes sorbact swab Electronic Signature(s) Signed: 12/27/2019 5:41:26 PM By: Linton Ham MD Signed: 01/21/2020 1:30:32 PM By: Deon Pilling Entered By: Linton Ham on 12/27/2019 09:24:00 -------------------------------------------------------------------------------- Multi-Disciplinary Care Plan Details Patient Name: Date of Service: [place], [place] 12/27/2019 8:00 A M Medical Record Number: [id number] Patient Account Number: [id number] Date of Birth/Sex: Treating RN: 03-07-1941 (79 y.o. [name] Ariella Voit: [name]., [name] Other Clinician: Referring Jozi Malachi: Treating Daniah Zaldivar/Extender: [name], [name] in Treatment: 4 Active Inactive Abuse / Safety / Falls / Self Care Management Nursing Diagnoses: Potential for falls Goals: Patient will remain injury free related to falls Date Initiated: 10/11/2019 Target Resolution Date: 01/18/2020 Goal Status: Active Patient/caregiver will verbalize understanding of the importance to maintain current immunizations/vaccinations Date Initiated: 10/11/2019 Date Inactivated: 11/08/2019 Target Resolution Date: 12/06/2019 Goal Status: Met Interventions: Assess self care needs on admission and as needed Provide education on fall prevention Treatment Activities: Patient referred to home care :  10/11/2019 Notes: Nutrition Nursing Diagnoses:  Potential for alteratiion in Nutrition/Potential for imbalanced nutrition Goals: Patient/caregiver agrees to and verbalizes understanding of need to obtain nutritional consultation Date Initiated: 10/11/2019 Date Inactivated: 11/01/2019 Target Resolution Date: 11/09/2019 Goal Status: Met Patient/caregiver agrees to and verbalizes understanding of need to use nutritional supplements and/or vitamins as prescribed Date Initiated: 10/11/2019 Target Resolution Date: 01/25/2020 Goal Status: Active Interventions: Assess HgA1c results as ordered upon admission and as needed Provide education on nutrition Treatment Activities: Education provided on Nutrition : 12/21/2019 Obtain HgA1c : 10/11/2019 Patient referred to Primary Care Physician for further nutritional evaluation : 10/11/2019 Notes: Electronic Signature(s) Signed: 12/27/2019 5:33:37 PM By: Deon Pilling Signed: 01/21/2020 1:30:32 PM By: Deon Pilling Entered By: Deon Pilling on 12/27/2019 08:22:33 -------------------------------------------------------------------------------- Pain Assessment Details Patient Name: Date of Service: [place] 12/27/2019 8:00 [place] Record Number: [id number] Patient Account Number: [id number] Date of Birth/Sex: Treating RN: 11/07/1940 (79 y.o. [name] Charli Liberatore: [name]., [name] Other Clinician: Referring Cambri Plourde: Treating Anesia Blackwell/Extender: [name], [name] in Treatment: 11 Active Problems Location of Pain Severity and Description of Pain Patient Has Paino No Site Locations Pain Management and Medication Current Pain Management: Electronic Signature(s) Signed: 01/09/2020 9:09:16 AM By: Sandre Kitty Signed: 01/21/2020 1:30:32 PM By: Deon Pilling Entered By: Sandre Kitty on 12/27/2019  07:54:44 -------------------------------------------------------------------------------- Patient/Caregiver Education Details Patient Name: Date of Service: [place], [place] 4/29/2021andnbsp8:00 [place] Record Number: [id number] Patient Account Number: [id number] Date of Birth/Gender: Treating RN: [birth year] (79 y.o. [name] Physician: [name]., [name] Other Clinician: Referring Physician: Treating Physician/Extender: [name], [name] in Treatment: 11 Education Assessment Education Provided To: Patient Education Topics Provided Nutrition: Handouts: Nutrition Methods: Explain/Verbal Responses: Reinforcements needed Electronic Signature(s) Signed: 12/27/2019 5:33:37 PM By: Deon Pilling Entered By: Deon Pilling on 12/27/2019 08:22:44 -------------------------------------------------------------------------------- Wound Assessment Details Patient Name: Date of Service: [place] [name] [place] 12/27/2019 8:00 [place] Record Number: [id number] Patient Account Number: [id number] Date of Birth/Sex: Treating RN: 1940/09/15 (79 y.o. [name] Primary Care Cullen Vanallen: [name]., [name] Other Clinician: Referring Larrisa Cravey: Treating Waneta Fitting/Extender: [name], [name] in Treatment: 11 Wound Status Wound Number: 1 Primary Venous Leg Ulcer Etiology: Wound Location: Left, Lateral Lower Leg Wound Open Wounding Event: Blister Status: Date Acquired: 09/11/2019 Comorbid Anemia, Congestive Heart Failure, Hypertension, Peripheral Weeks Of Treatment: 11 History: Venous Disease, End Stage Renal Disease Clustered Wound: Yes Photos Photo Uploaded By: Mikeal Hawthorne on 12/28/2019 14:54:32 Wound Measurements Length: (cm) Width: (cm) Depth: (cm) Clustered Quantity: Area: (cm) Volume: (cm) 2.2 % Reduction in Area: 94.1% 2 % Reduction in Volume: 88.3% 0.2 Epithelialization: Small  (1-33%) 2 Tunneling: No 3.456 Undermining: No 0.691 Wound Description Classification: Full Thickness Without Exposed Support Structures Wound Margin: Distinct, outline attached Exudate Amount: Medium Exudate Type: Serosanguineous Exudate Color: red, brown Foul Odor After Cleansing: No Slough/Fibrino Yes Wound Bed Granulation Amount: Medium (34-66%) Exposed Structure Granulation Quality: Pink Fascia Exposed: No Necrotic Amount: Medium (34-66%) Fat Layer (Subcutaneous Tissue) Exposed: Yes Necrotic Quality: Adherent Slough Tendon Exposed: No Muscle Exposed: No Joint Exposed: No Bone Exposed: No Electronic Signature(s) Signed: 12/27/2019 5:19:50 PM By: Kela Millin Entered By: Kela Millin on 12/27/2019 08:06:35 -------------------------------------------------------------------------------- Wound Assessment Details Patient Name: Date of Service: [place] 12/27/2019 8:00 A M Medical Record Number: [id number] Patient Account Number: [id number] Date of  Birth/Sex: Treating RN: [birth year] (79 y.o. [name] Primary Care Supreme Rybarczyk: [name]., [name] Other Clinician: Referring Odelia Graciano: Treating Kylor Valverde/Extender: [name], [name] in Treatment: 11 Wound Status Wound Number: 3 Primary Venous Leg Ulcer Etiology: Wound Location: Right, Proximal, Lateral Lower Leg Wound Open Wounding Event: Blister Status: Date Acquired: 09/11/2019 Comorbid Anemia, Congestive Heart Failure, Hypertension, Peripheral Weeks Of Treatment: 11 History: Venous Disease, End Stage Renal Disease Clustered Wound: No Photos Photo Uploaded By: Mikeal Hawthorne on 12/28/2019 14:53:39 Wound Measurements Length: (cm) 3 Width: (cm) 2 Depth: (cm) 0.2 Area: (cm) 4.712 Volume: (cm) 0.942 Wound Description Classification: Full Thickness Without Exposed Support Structu Wound Margin: Well defined, not attached Exudate Amount: Medium Exudate Type:  Serosanguineous Exudate Color: red, brown Foul Odor After Cleansing: Slough/Fibrino % Reduction in Area: 4.8% % Reduction in Volume: -90.3% Epithelialization: Small (1-33%) Tunneling: No Undermining: No res No Yes Wound Bed Granulation Amount: Medium (34-66%) Exposed Structure Granulation Quality: Red, Pink Fascia Exposed: No Necrotic Amount: Medium (34-66%) Fat Layer (Subcutaneous Tissue) Exposed: Yes Necrotic Quality: Adherent Slough Tendon Exposed: No Muscle Exposed: No Joint Exposed: No Bone Exposed: No Electronic Signature(s) Signed: 12/27/2019 5:19:50 PM By: Kela Millin Entered By: Kela Millin on 12/27/2019 08:07:19 -------------------------------------------------------------------------------- Wound Assessment Details Patient Name: Date of Service: [place], [place] 12/27/2019 8:00 A M Medical Record Number: [id number] Patient Account Number: [id number] Date of Birth/Sex: Treating RN: 1940-12-01 (79 y.o. [name] Primary Care Rashawn Rolon: [name]., [name] Other Clinician: Referring Alva Broxson: Treating Cornelia Walraven/Extender: [name], [name] in Treatment: 11 Wound Status Wound Number: 4 Primary Venous Leg Ulcer Etiology: Wound Location: Right, Distal, Lateral Lower Leg Wound Open Wounding Event: Blister Status: Date Acquired: 09/11/2019 Comorbid Anemia, Congestive Heart Failure, Hypertension, Peripheral Weeks Of Treatment: 11 History: Venous Disease, End Stage Renal Disease Clustered Wound: No Photos Photo Uploaded By: Mikeal Hawthorne on 12/28/2019 14:53:57 Wound Measurements Length: (cm) 2.8 Width: (cm) 2.5 Depth: (cm) 0.2 Area: (cm) 5.498 Volume: (cm) 1.1 % Reduction in Area: 40.9% % Reduction in Volume: -18.3% Epithelialization: Small (1-33%) Tunneling: No Undermining: No Wound Description Classification: Full Thickness Without Exposed Support Structures Wound Margin: Well defined, not  attached Exudate Amount: Medium Exudate Type: Serosanguineous Exudate Color: red, brown Wound Bed Granulation Amount: Medium (34-66%) Granulation Quality: Pink Necrotic Amount: Medium (34-66%) Necrotic Quality: Adherent Slough Foul Odor After Cleansing: No Slough/Fibrino Yes Exposed Structure Fascia Exposed: No Fat Layer (Subcutaneous Tissue) Exposed: Yes Tendon Exposed: No Muscle Exposed: No Joint Exposed: No Bone Exposed: No Electronic Signature(s) Signed: 12/27/2019 5:19:50 PM By: Kela Millin Entered By: Kela Millin on 12/27/2019 08:07:45 -------------------------------------------------------------------------------- Wound Assessment Details Patient Name: Date of Service: [place], [place] 12/27/2019 8:00 A M Medical Record Number: [id number] Patient Account Number: [id number] Date of Birth/Sex: Treating RN: 1941/03/02 (79 y.o. [name] Primary Care Tuwanda Vokes: [name]., [name] Other Clinician: Referring Noriel Guthrie: Treating [employer] Null/Extender: [name], [name] in Treatment: 11 Wound Status Wound Number: 6 Primary Venous Leg Ulcer Etiology: Wound Location: Left, Proximal, Lateral Lower Leg Wound Open Wounding Event: Gradually Appeared Status: Date Acquired: 12/13/2019 Comorbid Anemia, Congestive Heart Failure, Hypertension, Peripheral Weeks Of Treatment: 2 History: Venous Disease, End Stage Renal Disease Clustered Wound: No Photos Photo Uploaded By: Mikeal Hawthorne on 12/28/2019 14:54:33 Wound Measurements Length: (cm) 2.3 Width: (cm) 1.6 Depth: (cm) 0.2 Area: (cm) 2.89 Volume: (cm) 0.578 % Reduction in Area: 13.4% %  Reduction in Volume: 42.3% Epithelialization: Small (1-33%) Tunneling: No Undermining: No Wound Description Classification: Full Thickness Without Exposed Support Structures Wound Margin: Flat and Intact Exudate Amount: Medium Exudate Type: Serosanguineous Exudate Color: red,  brown Foul Odor After Cleansing: No Slough/Fibrino Yes Wound Bed Granulation Amount: Medium (34-66%) Exposed Structure Granulation Quality: Red Fascia Exposed: No Necrotic Amount: Medium (34-66%) Fat Layer (Subcutaneous Tissue) Exposed: Yes Necrotic Quality: Adherent Slough Tendon Exposed: No Muscle Exposed: No Joint Exposed: No Bone Exposed: No Electronic Signature(s) Signed: 12/27/2019 5:19:50 PM By: Kela Millin Entered By: Kela Millin on 12/27/2019 08:06:58 -------------------------------------------------------------------------------- Vitals Details Patient Name: Date of Service: [place] 12/27/2019 8:00 A M Medical Record Number: [id number] Patient Account Number: [id number] Date of Birth/Sex: Treating RN: 10/01/1940 (79 y.o. [name], [name] Primary Care Andrus Sharp: [name]., [name] Other Clinician: Referring Anayeli Arel: Treating Courtland Coppa/Extender: [name], [name] in Treatment: 11 Vital Signs Time Taken: 07:54 Temperature (F): 97.6 Height (in): 71 Pulse (bpm): 97 Weight (lbs): 198 Respiratory Rate (breaths/min): 20 Body Mass Index (BMI): 27.6 Blood Pressure (mmHg): 117/69 Reference Range: 80 - 120 mg / dl Electronic Signature(s) Signed: 01/09/2020 9:09:16 AM By: Sandre Kitty Entered By: Sandre Kitty on 12/27/2019 07:54:37

## 2020-01-24 ENCOUNTER — Encounter (HOSPITAL_BASED_OUTPATIENT_CLINIC_OR_DEPARTMENT_OTHER): Payer: Medicare Other | Attending: Internal Medicine | Admitting: Internal Medicine

## 2020-01-24 DIAGNOSIS — E1122 Type 2 diabetes mellitus with diabetic chronic kidney disease: Secondary | ICD-10-CM | POA: Insufficient documentation

## 2020-01-24 DIAGNOSIS — I890 Lymphedema, not elsewhere classified: Secondary | ICD-10-CM | POA: Diagnosis not present

## 2020-01-24 DIAGNOSIS — I130 Hypertensive heart and chronic kidney disease with heart failure and stage 1 through stage 4 chronic kidney disease, or unspecified chronic kidney disease: Secondary | ICD-10-CM | POA: Diagnosis not present

## 2020-01-24 DIAGNOSIS — L97822 Non-pressure chronic ulcer of other part of left lower leg with fat layer exposed: Secondary | ICD-10-CM | POA: Insufficient documentation

## 2020-01-24 DIAGNOSIS — I429 Cardiomyopathy, unspecified: Secondary | ICD-10-CM | POA: Diagnosis not present

## 2020-01-24 DIAGNOSIS — E11621 Type 2 diabetes mellitus with foot ulcer: Secondary | ICD-10-CM | POA: Insufficient documentation

## 2020-01-24 DIAGNOSIS — Z7901 Long term (current) use of anticoagulants: Secondary | ICD-10-CM | POA: Diagnosis not present

## 2020-01-24 DIAGNOSIS — N183 Chronic kidney disease, stage 3 unspecified: Secondary | ICD-10-CM | POA: Insufficient documentation

## 2020-01-24 DIAGNOSIS — I509 Heart failure, unspecified: Secondary | ICD-10-CM | POA: Insufficient documentation

## 2020-01-24 DIAGNOSIS — L97812 Non-pressure chronic ulcer of other part of right lower leg with fat layer exposed: Secondary | ICD-10-CM | POA: Insufficient documentation

## 2020-01-24 DIAGNOSIS — E1151 Type 2 diabetes mellitus with diabetic peripheral angiopathy without gangrene: Secondary | ICD-10-CM | POA: Diagnosis not present

## 2020-01-24 NOTE — Progress Notes (Signed)
[place], FANT ([id number]) Visit Report for 01/24/2020 Debridement Details Patient Name: Date of Service: [place], [place] 01/24/2020 8:00 [place] Record Number: [id number] Patient Account Number: [id number] Date of Birth/Sex: Treating RN: 01/02/1941 (79 y.o. [name], [name] Primary Care Provider: [name]., [name] Other Clinician: Referring Provider: Treating Provider/Extender: Felton Clinton., [name] in Treatment: 15 Debridement Performed for Assessment: Wound #3 Right,Proximal,Lateral Lower Leg Performed By: Physician Tobi Bastos, MD Debridement Type: Debridement Severity of Tissue Pre Debridement: Fat layer exposed Level of Consciousness (Pre-procedure): Awake and Alert Pre-procedure Verification/Time Out Yes - 08:48 Taken: Start Time: 08:49 Pain Control: Lidocaine 4% T opical Solution T Area Debrided (L x W): otal 3 (cm) x 2 (cm) = 6 (cm) Tissue and other material debrided: Viable, Non-Viable, Slough, Subcutaneous, Skin: Dermis , Fibrin/Exudate, Slough Level: Skin/Subcutaneous Tissue Debridement Description: Excisional Instrument: Curette Bleeding: Minimum Hemostasis Achieved: Pressure End Time: 08:52 Procedural Pain: 0 Post Procedural Pain: 3 Response to Treatment: Procedure was tolerated well Level of Consciousness (Post- Awake and Alert procedure): Post Debridement Measurements of Total Wound Length: (cm) 3 Width: (cm) 2 Depth: (cm) 0.1 Volume: (cm) 0.471 Character of Wound/Ulcer Post Debridement: Improved Severity of Tissue Post Debridement: Fat layer exposed Post Procedure Diagnosis Same as Pre-procedure Electronic Signature(s) Signed: 01/24/2020 4:57:23 PM By: Tobi Bastos MD, MBA Signed: 01/24/2020 5:42:52 PM By: Deon Pilling Entered By: Deon Pilling on 01/24/2020 08:53:19 -------------------------------------------------------------------------------- Debridement Details Patient Name: Date of Service: [place], [place] 01/24/2020 8:00 A M Medical Record Number: [id number] Patient Account Number: [id number] Date of Birth/Sex: Treating RN: 08/15/1941 (79 y.o. [name], [name] Primary Care Provider: [name]., [name] Other Clinician: Referring Provider: Treating Provider/Extender: Felton Clinton., [name] in Treatment: 15 Debridement Performed for Assessment: Wound #4 Right,Distal,Lateral Lower Leg Performed By: Physician Tobi Bastos, MD Debridement Type: Debridement Severity of Tissue Pre Debridement: Fat layer exposed Level of Consciousness (Pre-procedure): Awake and Alert Pre-procedure Verification/Time Out Yes - 08:48 Taken: Start Time: 08:49 Pain Control: Lidocaine 4% T opical Solution T Area Debrided (L x W): otal 2.6 (cm) x 2.5 (cm) = 6.5 (cm) Tissue and other material debrided: Viable, Non-Viable, Slough, Subcutaneous, Skin: Dermis , Fibrin/Exudate, Slough Level: Skin/Subcutaneous Tissue Debridement Description: Excisional Instrument: Curette Bleeding: Minimum Hemostasis Achieved: Pressure End Time: 08:52 Procedural Pain: 0 Post Procedural Pain: 3 Response to Treatment: Procedure was tolerated well Level of Consciousness (Post- Awake and Alert procedure): Post Debridement Measurements of Total Wound Length: (cm) 2.6 Width: (cm) 2.5 Depth: (cm) 0.2 Volume: (cm) 1.021 Character of Wound/Ulcer Post Debridement: Improved Severity of Tissue Post Debridement: Fat layer exposed Post Procedure Diagnosis Same as Pre-procedure Electronic Signature(s) Signed: 01/24/2020 4:57:23 PM By: Tobi Bastos MD, MBA Signed: 01/24/2020 5:42:52 PM By: Deon Pilling Entered By: Deon Pilling on 01/24/2020 08:53:43 -------------------------------------------------------------------------------- Debridement Details Patient Name: Date of Service: [place], [place] 01/24/2020 8:00 A M Medical Record Number: [id number] Patient Account Number: [id number] Date of  Birth/Sex: Treating RN: [birth year] (79 y.o. [name], [name] Primary Care Provider: [name]., [name] Other Clinician: Referring Provider: Treating Provider/Extender: Felton Clinton., [name] in Treatment: 15 Debridement Performed for Assessment: Wound #6 Left,Proximal,Lateral Lower Leg Performed By: Physician Tobi Bastos, MD Debridement Type: Debridement Severity of Tissue Pre Debridement: Fat layer exposed Level of Consciousness (Pre-procedure): Awake and Alert Pre-procedure Verification/Time Out Yes - 08:48 Taken: Start Time: 08:49 Pain Control: Lidocaine  4% T opical Solution T Area Debrided (L x W): otal 2.3 (cm) x 1.6 (cm) = 3.68 (cm) Tissue and other material debrided: Viable, Non-Viable, Slough, Subcutaneous, Skin: Dermis , Fibrin/Exudate, Slough Level: Skin/Subcutaneous Tissue Debridement Description: Excisional Instrument: Curette Bleeding: Minimum Hemostasis Achieved: Pressure End Time: 08:52 Procedural Pain: 0 Post Procedural Pain: 3 Response to Treatment: Procedure was tolerated well Level of Consciousness (Post- Awake and Alert procedure): Post Debridement Measurements of Total Wound Length: (cm) 2.3 Width: (cm) 1.6 Depth: (cm) 0.2 Volume: (cm) 0.578 Character of Wound/Ulcer Post Debridement: Improved Severity of Tissue Post Debridement: Fat layer exposed Post Procedure Diagnosis Same as Pre-procedure Electronic Signature(s) Signed: 01/24/2020 4:57:23 PM By: Tobi Bastos MD, MBA Signed: 01/24/2020 5:42:52 PM By: Deon Pilling Entered By: Deon Pilling on 01/24/2020 08:54:41 -------------------------------------------------------------------------------- Debridement Details Patient Name: Date of Service: [place], [place] 01/24/2020 8:00 A M Medical Record Number: [id number] Patient Account Number: [id number] Date of Birth/Sex: Treating RN: 21-Apr-1941 (79 y.o. [name], [name] Primary Care Provider: [name]., RO  [name] Other Clinician: Referring Provider: Treating Provider/Extender: Felton Clinton., [name] in Treatment: 15 Debridement Performed for Assessment: Wound #1 Left,Lateral Lower Leg Performed By: Physician Tobi Bastos, MD Debridement Type: Debridement Severity of Tissue Pre Debridement: Fat layer exposed Level of Consciousness (Pre-procedure): Awake and Alert Pre-procedure Verification/Time Out Yes - 08:48 Taken: Start Time: 08:49 Pain Control: Lidocaine 4% T opical Solution T Area Debrided (L x W): otal 2.5 (cm) x 1.5 (cm) = 3.75 (cm) Tissue and other material debrided: Viable, Non-Viable, Slough, Subcutaneous, Skin: Dermis , Fibrin/Exudate, Slough Level: Skin/Subcutaneous Tissue Debridement Description: Excisional Instrument: Curette Bleeding: Minimum Hemostasis Achieved: Pressure End Time: 08:52 Procedural Pain: 0 Post Procedural Pain: 3 Response to Treatment: Procedure was tolerated well Level of Consciousness (Post- Awake and Alert procedure): Post Debridement Measurements of Total Wound Length: (cm) 2.5 Width: (cm) 1.5 Depth: (cm) 0.2 Volume: (cm) 0.589 Character of Wound/Ulcer Post Debridement: Improved Severity of Tissue Post Debridement: Fat layer exposed Post Procedure Diagnosis Same as Pre-procedure Electronic Signature(s) Signed: 01/24/2020 4:57:23 PM By: Tobi Bastos MD, MBA Signed: 01/24/2020 5:42:52 PM By: Deon Pilling Entered By: Deon Pilling on 01/24/2020 08:55:16 -------------------------------------------------------------------------------- HPI Details Patient Name: Date of Service: [place], [place] 01/24/2020 8:00 A M Medical Record Number: [id number] Patient Account Number: [id number] Date of Birth/Sex: Treating RN: 1941/08/12 (79 y.o. [name], [name] Primary Care Provider: [name]., [name] Other Clinician: Referring Provider: Treating Provider/Extender: Felton Clinton., [name] in  Treatment: 15 History of Present Illness HPI Description: ADMISSION 10/11/2019 This is a 79 year old man with a history of a severe cardiomyopathy with an ejection fraction of about 20%, chronic renal failure stage III. He is listed as a type II diabetic in epic although the patient denies this. He also has a history of PVD. He states for the last month he has had wounds on his bilateral lower extremities that started off as blisters which denuded. He has areas on the left lateral calf and 2 on the right lateral. He has an area on the left first met head which he did not know was there he we identified this on intake. He has been using Silvadene cream provided by his primary care physician but he is complaining that this burns. 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 He was acutely ill at that time complicating GI bleed. ABIs in our clinic were 1.16 on the right and 1.13 on the left 2/25; the patient comes in with his areas on the left lateral and right lateral calf. There is also an area over the left first MTP bunion deformity. We have been using Sorbact. His edema control is fairly good 3/4; left lateral and right lateral calf. Most of his wounds are in the same position tightly adherent nonviable debris. On the right we debrided the superior wound on the left both wounds. The area on his bunion over the left first MTP medially is I think just about closed. We have been using Sorbact without a lot of success changed to Iodoflex under compression 3/11; left lateral and right lateral calf perhaps minor improvement in the surface condition. We  have been using Iodoflex. The area over the bunion of the left first MTP has closed over 3/18; left lateral and right lateral calf not much improvement. We have been using Iodoflex. Aggressive debridement last week. 3/25; left lateral and right lateral calf. We have been using Iodoflex. There is some improvement in the superior area on the right and 2 on the lateral left although there is still a lot of debris on the surface. The inferior area on the right is still a completely nonviable surface. 4/8; left lateral and right lateral calfs. Essentially mirror-image looking wounds 2 wounds on each side in close juxtaposition we have been using Iodoflex with some improvement in the very adherent fibrinous debris but not a lot. The patient has arterial studies next Wednesday morning and venous studies next Thursday morning. I have been avoiding any further aggressive debridement until we see the arterial study results. His ABIs were fairly good in this clinic and is dorsalis pedis pulses are palpable but the wound beds are pale. 4/15; left lateral and right lateral calfs. Essentially mirror-image wounds with 2 wounds on each side in close juxtaposition but with rims of separating normal tissue. We have been using Iodoflex. The base of the wounds has been cleaning up quite nicely ARTERIAL STUDIES were done showing the patient had an ABI on the right of 1.27 with triphasic waveforms and a TBI of 0.90 on the left triphasic waveforms with an ABI of 1.28 and a TBI of 0.87. No evidence of arterial disease VENOUS REFLUX STUDIES; were done yesterday we do not have this report yet. 4/23; VENOUS REFLUX STUDIES did not show any significant reflux right lower extremity. No evidence of a DVT He did have significant reflux in the common . femoral vein on the left but nothing else was listed as significant. He did not have a DVT. We have been using Iodoflex under compression his wounds are making progress. 4/29;  improvements in the wound surface continue. We have been using Iodoflex. He has a 20% out-of-pocket co-pay for Apligraf which is unfortunate. We changed him to Sorbact today 5/6; started on Sorbact last week. Better looking wound surface but not much change in dimensions. 01/24/20-Patient is back at 3 weeks, wound surfaces are about the same but very minimal slough on the right leg wounds, however there is blistering on both legs adjacent to the wounds, patient denies any other symptoms or new symptoms. His studies have been reviewed and  do not indicate any significant arterial or venous disease. But he is attending once in 3 weeks with home health changing in between Electronic Signature(s) Signed: 01/24/2020 8:55:53 AM By: Tobi Bastos MD, MBA Entered By: Tobi Bastos on 01/24/2020 08:55:53 -------------------------------------------------------------------------------- Physical Exam Details Patient Name: Date of Service: [place], [place] 01/24/2020 8:00 A M Medical Record Number: [id number] Patient Account Number: [id number] Date of Birth/Sex: Treating RN: 07/18/1941 (79 y.o. [name], [name] Primary Care Provider: [name]., [name] Other Clinician: Referring Provider: Treating Provider/Extender: Felton Clinton., [name] in Treatment: 15 Constitutional alert and oriented x 3. sitting or standing blood pressure is within target range for patient.. supine blood pressure is within target range for patient.. pulse regular and within target range for patient.Marland Kitchen respirations regular, non-labored and within target range for patient.Marland Kitchen temperature within target range for patient.. . . Well- nourished and well-hydrated in no acute distress. Notes I used a #5 curette to debride the ulcers on the right lateral calf removing slough to a slight degree, some bleeding noted that stopped, I also incised 1 blister adjacent to this area He has a blister on the left leg also  adjacent to the left leg wounds Electronic Signature(s) Signed: 01/24/2020 8:56:41 AM By: Tobi Bastos MD, MBA Entered By: Tobi Bastos on 01/24/2020 08:56:41 -------------------------------------------------------------------------------- Physician Orders Details Patient Name: Date of Service: [place], [place] 01/24/2020 8:00 [place] Record Number: [id number] Patient Account Number: [id number] Date of Birth/Sex: Treating RN: 09-Oct-1940 (79 y.o. [name], [name] Primary Care Provider: [name]., [name] Other Clinician: Referring Provider: Treating Provider/Extender: Felton Clinton., [name] in Treatment: 14 Verbal / Phone Orders: No Diagnosis Coding ICD-10 Coding Code Description [phone] Chronic venous hypertension (idiopathic) with inflammation of bilateral lower extremity I89.0 Lymphedema, not elsewhere classified L97.822 Non-pressure chronic ulcer of other part of left lower leg with fat layer exposed L97.812 Non-pressure chronic ulcer of other part of right lower leg with fat layer exposed Follow-up Appointments Return Appointment in 1 week. Dressing Change Frequency Other: - change twice a week by home health. Skin Barriers/Peri-Wound Care Moisturizing lotion TCA Cream or Ointment - liberally in clinic today mixed with lotion. Wound Cleansing May shower with protection. - use cast protectors on the days dressings are not changed. May shower and wash wound with soap and water. - with dressing changes only. Primary Wound Dressing Wound #1 Left,Lateral Lower Leg Calcium Alginate with Silver Wound #3 Right,Proximal,Lateral Lower Leg Calcium Alginate with Silver Wound #4 Right,Distal,Lateral Lower Leg Calcium Alginate with Silver Wound #6 Left,Proximal,Lateral Lower Leg Calcium Alginate with Silver Wound #7 Right,Lateral Lower Leg Calcium Alginate with Silver Secondary Dressing ABD pad Other: - pad left medial first met head for  protection. Edema Control 4 layer compression - Bilateral - ensure to wrap from foot to just below calf. Avoid standing for long periods of time Elevate legs to the level of the heart or above for 30 minutes daily and/or when sitting, a frequency of: - throughout the day. [facility] skilled nursing for wound care. [facility] Electronic Signature(s) Signed: 01/24/2020 4:57:23 PM By: Tobi Bastos MD, MBA Signed: 01/24/2020 5:42:52 PM By: Deon Pilling Entered By: Deon Pilling on 01/24/2020 08:55:55 -------------------------------------------------------------------------------- Problem List Details Patient Name: Date of Service: [place] 01/24/2020 8:00 A M Medical Record Number: [id number] Patient Account Number: [id number] Date of Birth/Sex: Treating  RN: Mar 07, 1941 (79 y.o. 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/06/2020 No Yes L97.812 Non-pressure chronic ulcer of other part of right lower leg with fat layer 01/03/2020 No Yes exposed Inactive Problems ICD-10 Code Description Active Date Inactive Date L97.521 Non-pressure chronic ulcer of other part of left foot limited to breakdown of skin 10/11/2019 10/11/2019 Resolved Problems ICD-10 Code Description Active Date Resolved Date 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 10/11/2019 10/11/2019 Electronic Signature(s) Signed: 01/24/2020 4:57:23 PM By: Tobi Bastos MD,  MBA Signed: 01/24/2020 5:42:52 PM By: Deon Pilling Entered By: Deon Pilling on 01/24/2020 07:53:03 -------------------------------------------------------------------------------- Progress Note Details Patient Name: Date of Service: [place], [name] 01/24/2020 8:00 [place] Record Number: [id number] Patient Account Number: [id number] Date of Birth/Sex: Treating RN: 06-13-1941 (79 y.o. [name], [name] Primary Care Provider: [name]., [name] Other Clinician: Referring Provider: Treating Provider/Extender: Felton Clinton., [name] in Treatment: 15 Subjective History of Present Illness (HPI) ADMISSION 10/11/2019 This is a 79 year old man with a history of a severe cardiomyopathy with an ejection fraction of about 20%, chronic renal failure stage III. He is listed as a type II diabetic in epic although the patient denies this. He also has a history of PVD. He states for the last month he has had wounds on his bilateral lower extremities that started off as blisters which denuded. He has areas on the left lateral calf and 2 on the right lateral. He has an area on the left first met head which he did not know was there he we identified this on intake. He has been using Silvadene cream provided by his primary care physician but he is complaining that this burns. 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 He was acutely ill at that time complicating GI bleed. ABIs in our clinic were 1.16 on the right and 1.13 on the left 2/25; the patient comes in with his areas on the left lateral and right lateral calf. There is  also an area over the left first MTP bunion deformity. We have been using Sorbact. His edema control is fairly good 3/4; left lateral and right lateral calf. Most of his wounds are in the same position tightly adherent nonviable debris. On the right we debrided the superior wound on the left both wounds. The area on his bunion over the left first MTP medially is I think just about closed. We have been using Sorbact without a lot of success changed to Iodoflex under compression 3/11; left lateral and right lateral calf perhaps minor improvement in the surface condition. We have been using Iodoflex. The area over the bunion of the left first MTP has closed over 3/18; left lateral and right lateral calf not much  improvement. We have been using Iodoflex. Aggressive debridement last week. 3/25; left lateral and right lateral calf. We have been using Iodoflex. There is some improvement in the superior area on the right and 2 on the lateral left although there is still a lot of debris on the surface. The inferior area on the right is still a completely nonviable surface. 4/8; left lateral and right lateral calfs. Essentially mirror-image looking wounds 2 wounds on each side in close juxtaposition we have been using Iodoflex with some improvement in the very adherent fibrinous debris but not a lot. The patient has arterial studies next Wednesday morning and venous studies next Thursday morning. I have been avoiding any further aggressive debridement until we see the arterial study results. His ABIs were fairly good in this clinic and is dorsalis pedis pulses are palpable but the wound beds are pale. 4/15; left lateral and right lateral calfs. Essentially mirror-image wounds with 2 wounds on each side in close juxtaposition but with rims of separating normal tissue. We have been using Iodoflex. The base of the wounds has been cleaning up quite nicely ARTERIAL STUDIES were done showing the patient had an ABI  on the right of 1.27 with triphasic waveforms and a TBI of 0.90 on the left triphasic waveforms with an ABI of 1.28 and a TBI of 0.87. No evidence of arterial disease VENOUS REFLUX STUDIES; were done yesterday we do not have this report yet. 4/23; VENOUS REFLUX STUDIES did not show any significant reflux right lower extremity. No evidence of a DVT He did have significant reflux in the common . femoral vein on the left but nothing else was listed as significant. He did not have a DVT. We have been using Iodoflex under compression his wounds are making progress. 4/29; improvements in the wound surface continue. We have been using Iodoflex. He has a 20% out-of-pocket co-pay for Apligraf which is unfortunate. We changed him to Sorbact today 5/6; started on Sorbact last week. Better looking wound surface but not much change in dimensions. 01/24/20-Patient is back at 3 weeks, wound surfaces are about the same but very minimal slough on the right leg wounds, however there is blistering on both legs adjacent to the wounds, patient denies any other symptoms or new symptoms. His studies have been reviewed and do not indicate any significant arterial or venous disease. But he is attending once in 3 weeks with home health changing in between Objective Constitutional alert and oriented x 3. sitting or standing blood pressure is within target range for patient.. supine blood pressure is within target range for patient.. pulse regular and within target range for patient.Marland Kitchen respirations regular, non-labored and within target range for patient.Marland Kitchen temperature within target range for patient.. Well- nourished and well-hydrated in no acute distress. Vitals Time Taken: 7:57 AM, Height: 71 in, Weight: 198 lbs, BMI: 27.6, Temperature: 98.1 F, Pulse: 96 bpm, Respiratory Rate: 20 breaths/min, Blood Pressure: 128/72 mmHg. General Notes: I used a #5 curette to debride the ulcers on the right lateral calf removing slough to  a slight degree, some bleeding noted that stopped, I also incised 1 blister adjacent to this area He has a blister on the left leg also adjacent to the left leg wounds Integumentary (Hair, Skin) Wound #1 status is Open. Original cause of wound was Blister. The wound is located on the Left,Lateral Lower Leg. The wound measures 2.5cm length x 1.5cm width x 0.2cm depth; 2.945cm^2 area and 0.589cm^3 volume. There is Fat Layer (Subcutaneous  Tissue) Exposed exposed. There is no tunneling or undermining noted. There is a medium amount of serosanguineous drainage noted. The wound margin is distinct with the outline attached to the wound base. There is medium (34-66%) red granulation within the wound bed. There is a medium (34-66%) amount of necrotic tissue within the wound bed including Adherent Slough. Wound #3 status is Open. Original cause of wound was Blister. The wound is located on the Right,Proximal,Lateral Lower Leg. The wound measures 3cm length x 2cm width x 0.1cm depth; 4.712cm^2 area and 0.471cm^3 volume. There is Fat Layer (Subcutaneous Tissue) Exposed exposed. There is no tunneling or undermining noted. There is a medium amount of serosanguineous drainage noted. The wound margin is well defined and not attached to the wound base. There is medium (34-66%) red, pink granulation within the wound bed. There is a medium (34-66%) amount of necrotic tissue within the wound bed including Adherent Slough. Wound #4 status is Open. Original cause of wound was Blister. The wound is located on the Right,Distal,Lateral Lower Leg. The wound measures 2.6cm length x 2.5cm width x 0.2cm depth; 5.105cm^2 area and 1.021cm^3 volume. There is Fat Layer (Subcutaneous Tissue) Exposed exposed. There is no tunneling or undermining noted. There is a medium amount of serosanguineous drainage noted. The wound margin is well defined and not attached to the wound base. There is medium (34-66%) pink granulation within the  wound bed. There is a medium (34-66%) amount of necrotic tissue within the wound bed including Adherent Slough. Wound #6 status is Open. Original cause of wound was Gradually Appeared. The wound is located on the Left,Proximal,Lateral Lower Leg. The wound measures 2.3cm length x 1.6cm width x 0.2cm depth; 2.89cm^2 area and 0.578cm^3 volume. There is Fat Layer (Subcutaneous Tissue) Exposed exposed. There is no tunneling or undermining noted. There is a medium amount of serosanguineous drainage noted. The wound margin is flat and intact. There is medium (34-66%) red granulation within the wound bed. There is a medium (34-66%) amount of necrotic tissue within the wound bed including Adherent Slough. Wound #7 status is Open. Original cause of wound was Blister. The wound is located on the Right,Lateral Lower Leg. The wound measures 8cm length x 4cm width x 0.1cm depth; 25.133cm^2 area and 2.513cm^3 volume. There is no tunneling or undermining noted. There is a medium amount of serosanguineous drainage noted. The wound margin is flat and intact. There is large (67-100%) red granulation within the wound bed. There is no necrotic tissue within the wound bed. Assessment Active Problems ICD-10 Chronic venous hypertension (idiopathic) with inflammation of bilateral lower extremity Lymphedema, not elsewhere classified Non-pressure chronic ulcer of other part of left lower leg with fat layer exposed Non-pressure chronic ulcer of other part of right lower leg with fat layer exposed Procedures Wound #1 Pre-procedure diagnosis of Wound #1 is a Venous Leg Ulcer located on the Left,Lateral Lower Leg .Severity of Tissue Pre Debridement is: Fat layer exposed. There was a Excisional Skin/Subcutaneous Tissue Debridement with a total area of 3.75 sq cm performed by Tobi Bastos, MD. With the following instrument(s): Curette to remove Viable and Non-Viable tissue/material. Material removed includes Subcutaneous  Tissue, Slough, Skin: Dermis, and Fibrin/Exudate after achieving pain control using Lidocaine 4% Topical Solution. A time out was conducted at 08:48, prior to the start of the procedure. A Minimum amount of bleeding was controlled with Pressure. The procedure was tolerated well with a pain level of 0 throughout and a pain level of 3 following the procedure. Post  Debridement Measurements: 2.5cm length x 1.5cm width x 0.2cm depth; 0.589cm^3 volume. Character of Wound/Ulcer Post Debridement is improved. Severity of Tissue Post Debridement is: Fat layer exposed. Post procedure Diagnosis Wound #1: Same as Pre-Procedure Pre-procedure diagnosis of Wound #1 is a Venous Leg Ulcer located on the Left,Lateral Lower Leg . There was a Four Layer Compression Therapy Procedure with a pre-treatment ABI of 1.1 by Baruch Gouty, RN. Post procedure Diagnosis Wound #1: Same as Pre-Procedure Wound #3 Pre-procedure diagnosis of Wound #3 is a Venous Leg Ulcer located on the Right,Proximal,Lateral Lower Leg .Severity of Tissue Pre Debridement is: Fat layer exposed. There was a Excisional Skin/Subcutaneous Tissue Debridement with a total area of 6 sq cm performed by Tobi Bastos, MD. With the following instrument(s): Curette to remove Viable and Non-Viable tissue/material. Material removed includes Subcutaneous Tissue, Slough, Skin: Dermis, and Fibrin/Exudate after achieving pain control using Lidocaine 4% Topical Solution. A time out was conducted at 08:48, prior to the start of the procedure. A Minimum amount of bleeding was controlled with Pressure. The procedure was tolerated well with a pain level of 0 throughout and a pain level of 3 following the procedure. Post Debridement Measurements: 3cm length x 2cm width x 0.1cm depth; 0.471cm^3 volume. Character of Wound/Ulcer Post Debridement is improved. Severity of Tissue Post Debridement is: Fat layer exposed. Post procedure Diagnosis Wound #3: Same as  Pre-Procedure Pre-procedure diagnosis of Wound #3 is a Venous Leg Ulcer located on the Right,Proximal,Lateral Lower Leg . There was a Four Layer Compression Therapy Procedure with a pre-treatment ABI of 1.1 by Baruch Gouty, RN. Post procedure Diagnosis Wound #3: Same as Pre-Procedure Wound #4 Pre-procedure diagnosis of Wound #4 is a Venous Leg Ulcer located on the Right,Distal,Lateral Lower Leg .Severity of Tissue Pre Debridement is: Fat layer exposed. There was a Excisional Skin/Subcutaneous Tissue Debridement with a total area of 6.5 sq cm performed by Tobi Bastos, MD. With the following instrument(s): Curette to remove Viable and Non-Viable tissue/material. Material removed includes Subcutaneous Tissue, Slough, Skin: Dermis, and Fibrin/Exudate after achieving pain control using Lidocaine 4% Topical Solution. A time out was conducted at 08:48, prior to the start of the procedure. A Minimum amount of bleeding was controlled with Pressure. The procedure was tolerated well with a pain level of 0 throughout and a pain level of 3 following the procedure. Post Debridement Measurements: 2.6cm length x 2.5cm width x 0.2cm depth; 1.021cm^3 volume. Character of Wound/Ulcer Post Debridement is improved. Severity of Tissue Post Debridement is: Fat layer exposed. 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 There was a Four Layer Compression Therapy Procedure with a pre-treatment ABI of 1.1 by Baruch Gouty, RN. Post procedure Diagnosis Wound #4: Same as Pre-Procedure Wound #6 Pre-procedure diagnosis of Wound #6 is a Venous Leg Ulcer located on the Left,Proximal,Lateral Lower Leg .Severity of Tissue Pre Debridement is: Fat layer exposed. There was a Excisional Skin/Subcutaneous Tissue Debridement with a total area of 3.68 sq cm performed by Tobi Bastos, MD. With the  following instrument(s): Curette to remove Viable and Non-Viable tissue/material. Material removed includes Subcutaneous Tissue, Slough, Skin: Dermis, and Fibrin/Exudate after achieving pain control using Lidocaine 4% Topical Solution. A time out was conducted at 08:48, prior to the start of the procedure. A Minimum amount of bleeding was controlled with Pressure. The procedure was tolerated well with a pain level of 0 throughout and a pain level of  3 following the procedure. Post Debridement Measurements: 2.3cm length x 1.6cm width x 0.2cm depth; 0.578cm^3 volume. Character of Wound/Ulcer Post Debridement is improved. Severity of Tissue Post Debridement is: Fat layer exposed. Post procedure Diagnosis Wound #6: Same as Pre-Procedure Pre-procedure diagnosis of Wound #6 is a Venous Leg Ulcer located on the Left,Proximal,Lateral Lower Leg . There was a Four Layer Compression Therapy Procedure with a pre-treatment ABI of 1.1 by Baruch Gouty, RN. Post procedure Diagnosis Wound #6: Same as Pre-Procedure Wound #7 Pre-procedure diagnosis of Wound #7 is a Venous Leg Ulcer located on the Right,Lateral Lower Leg . There was a Four Layer Compression Therapy Procedure with a pre-treatment ABI of 1.1 by Baruch Gouty, RN. Post procedure Diagnosis Wound #7: Same as Pre-Procedure Plan Follow-up Appointments: Return Appointment in 1 week. Dressing Change Frequency: Other: - change twice a week by home health. Skin Barriers/Peri-Wound Care: Moisturizing lotion TCA Cream or Ointment - liberally in clinic today mixed with lotion. Wound Cleansing: May shower with protection. - use cast protectors on the days dressings are not changed. May shower and wash wound with soap and water. - with dressing changes only. Primary Wound Dressing: Wound #1 Left,Lateral Lower Leg: Calcium Alginate with Silver Wound #3 Right,Proximal,Lateral Lower Leg: Calcium Alginate with Silver Wound #4 Right,Distal,Lateral Lower  Leg: Calcium Alginate with Silver Wound #6 Left,Proximal,Lateral Lower Leg: Calcium Alginate with Silver Wound #7 Right,Lateral Lower Leg: Calcium Alginate with Silver Secondary Dressing: ABD pad Other: - pad left medial first met head for protection. Edema Control: 4 layer compression - Bilateral - ensure to wrap from foot to just below calf. Avoid standing for long periods of time Elevate legs to the level of the heart or above for 30 minutes daily and/or when sitting, a frequency of: - throughout the day. Home Health: [facility] for wound care. - [facility] -We will switch to silver alginate and 4 layer compression -Continue home health changing wrap at home -Unfortunately these wounds are about the same, and blistering is leading to new open areas I do not know if he can attend the clinic more frequently which might help [place] assess these more frequently as well. Electronic Signature(s) Signed: 01/24/2020 8:57:44 AM By: Tobi Bastos MD, MBA Entered By: Tobi Bastos on 01/24/2020 08:57:44 -------------------------------------------------------------------------------- SuperBill Details Patient Name: Date of Service: [place], [place] 01/24/2020 Medical Record Number: [id number] Patient Account Number: [id number] Date of Birth/Sex: Treating RN: 02/22/1941 (79 y.o. [name], [name] Primary Care Provider: [name]., [name] Other Clinician: Referring Provider: Treating Provider/Extender: Felton Clinton., [name] in Treatment: 15 Diagnosis Coding ICD-10 Codes Code Description [phone] Chronic venous hypertension (idiopathic) with inflammation of bilateral lower extremity I89.0 Lymphedema, not elsewhere classified L97.822 Non-pressure chronic ulcer of other part of left lower leg with fat layer exposed L97.812 Non-pressure chronic ulcer of other part of right lower leg with fat layer exposed Facility Procedures The  patient participates with Medicare or their insurance follows the Medicare Facility Guidelines: CPT4 Code Description Modifier Quantity [id number] - DEB SUBQ TISSUE 20 SQ CM/< 1 ICD-10 Diagnosis Description L97.822 Non-pressure chronic ulcer of  other part of left lower leg with fat layer exposed L97.812 Non-pressure chronic ulcer of other part of right lower leg with fat layer exposed Physician Procedures : CPT4 Code Description Modifier [id number] - WC PHYS SUBQ TISS 20 SQ CM ICD-10 Diagnosis Description L97.822 Non-pressure chronic ulcer of other part of left lower  leg with fat layer exposed L97.812 Non-pressure chronic ulcer of other part of right  lower leg with fat layer exposed Quantity: 1 Electronic Signature(s) Signed: 01/24/2020 8:57:52 AM By: Tobi Bastos MD, MBA Entered By: Tobi Bastos on 01/24/2020 08:57:51

## 2020-01-31 ENCOUNTER — Encounter (HOSPITAL_BASED_OUTPATIENT_CLINIC_OR_DEPARTMENT_OTHER): Payer: Medicare Other | Attending: Internal Medicine | Admitting: Internal Medicine

## 2020-01-31 ENCOUNTER — Other Ambulatory Visit: Payer: Self-pay

## 2020-01-31 DIAGNOSIS — X58XXXA Exposure to other specified factors, initial encounter: Secondary | ICD-10-CM | POA: Diagnosis not present

## 2020-01-31 DIAGNOSIS — E1122 Type 2 diabetes mellitus with diabetic chronic kidney disease: Secondary | ICD-10-CM | POA: Diagnosis not present

## 2020-01-31 DIAGNOSIS — E11621 Type 2 diabetes mellitus with foot ulcer: Secondary | ICD-10-CM | POA: Diagnosis not present

## 2020-01-31 DIAGNOSIS — L97812 Non-pressure chronic ulcer of other part of right lower leg with fat layer exposed: Secondary | ICD-10-CM | POA: Insufficient documentation

## 2020-01-31 DIAGNOSIS — D631 Anemia in chronic kidney disease: Secondary | ICD-10-CM | POA: Insufficient documentation

## 2020-01-31 DIAGNOSIS — E1151 Type 2 diabetes mellitus with diabetic peripheral angiopathy without gangrene: Secondary | ICD-10-CM | POA: Insufficient documentation

## 2020-01-31 DIAGNOSIS — I429 Cardiomyopathy, unspecified: Secondary | ICD-10-CM | POA: Insufficient documentation

## 2020-01-31 DIAGNOSIS — S90812A Abrasion, left foot, initial encounter: Secondary | ICD-10-CM | POA: Insufficient documentation

## 2020-01-31 DIAGNOSIS — N186 End stage renal disease: Secondary | ICD-10-CM | POA: Diagnosis not present

## 2020-01-31 DIAGNOSIS — I132 Hypertensive heart and chronic kidney disease with heart failure and with stage 5 chronic kidney disease, or end stage renal disease: Secondary | ICD-10-CM | POA: Diagnosis not present

## 2020-01-31 DIAGNOSIS — L97822 Non-pressure chronic ulcer of other part of left lower leg with fat layer exposed: Secondary | ICD-10-CM | POA: Insufficient documentation

## 2020-01-31 DIAGNOSIS — I509 Heart failure, unspecified: Secondary | ICD-10-CM | POA: Diagnosis not present

## 2020-01-31 DIAGNOSIS — Z7901 Long term (current) use of anticoagulants: Secondary | ICD-10-CM | POA: Diagnosis not present

## 2020-01-31 DIAGNOSIS — I890 Lymphedema, not elsewhere classified: Secondary | ICD-10-CM | POA: Insufficient documentation

## 2020-02-01 NOTE — Progress Notes (Signed)
CLABORN, JANUSZ ([id number]) Visit Report for 01/31/2020 Debridement Details Patient Name: Date of Service: [name] 01/31/2020 8:00 A M Medical Record Number: [id number] Patient Account Number: [id number] Date of Birth/Sex: Treating RN: [birth year] (79 y.o. [name] Provider: [name]., [name] Other Clinician: Referring Provider: Treating Provider/Extender: [name], [name] in Treatment: 16 Debridement Performed for Assessment: Wound #3 Right,Proximal,Lateral Lower Leg Performed By: Physician Ricard Dillon., MD Debridement Type: Debridement Severity of Tissue Pre Debridement: Fat layer exposed Level of Consciousness (Pre-procedure): Awake and Alert Pre-procedure Verification/Time Out Yes - 09:00 Taken: Start Time: 09:01 Pain Control: Lidocaine 4% T opical Solution T Area Debrided (L x W): otal 2.5 (cm) x 2 (cm) = 5 (cm) Tissue and other material debrided: Viable, Non-Viable, Slough, Subcutaneous, Skin: Dermis , Fibrin/Exudate, Slough Level: Skin/Subcutaneous Tissue Debridement Description: Excisional Instrument: Curette Bleeding: Moderate Hemostasis Achieved: Silver Nitrate End Time: 09:07 Procedural Pain: 0 Post Procedural Pain: 2 Response to Treatment: Procedure was tolerated well Level of Consciousness (Post- Awake and Alert procedure): Post Debridement Measurements of Total Wound Length: (cm) 2.5 Width: (cm) 2 Depth: (cm) 0.2 Volume: (cm) 0.785 Character of Wound/Ulcer Post Debridement: Requires Further Debridement Severity of Tissue Post Debridement: Fat layer exposed Post Procedure Diagnosis Same as Pre-procedure Electronic Signature(s) Signed: 01/31/2020 6:01:01 PM By: Deon Pilling Signed: 02/01/2020 8:05:25 AM By: Linton Ham MD Entered By: Linton Ham on 01/31/2020 09:26:00 -------------------------------------------------------------------------------- Debridement Details Patient Name:  Date of Service: [place] 01/31/2020 8:00 A M Medical Record Number: [id number] Patient Account Number: [id number] Date of Birth/Sex: Treating RN: 12-Aug-1941 (79 y.o. [name] Provider: [name]., [name] Other Clinician: Referring Provider: Treating Provider/Extender: [name], [name] in Treatment: 16 Debridement Performed for Assessment: Wound #4 Right,Distal,Lateral Lower Leg Performed By: Physician Ricard Dillon., MD Debridement Type: Debridement Severity of Tissue Pre Debridement: Fat layer exposed Level of Consciousness (Pre-procedure): Awake and Alert Pre-procedure Verification/Time Out Yes - 09:00 Taken: Start Time: 09:01 Pain Control: Lidocaine 4% T opical Solution T Area Debrided (L x W): otal 2.3 (cm) x 2.2 (cm) = 5.06 (cm) Tissue and other material debrided: Viable, Non-Viable, Slough, Subcutaneous, Skin: Dermis , Fibrin/Exudate, Slough Level: Skin/Subcutaneous Tissue Debridement Description: Excisional Instrument: Curette Bleeding: Moderate Hemostasis Achieved: Silver Nitrate End Time: 09:07 Procedural Pain: 0 Post Procedural Pain: 2 Response to Treatment: Procedure was tolerated well Level of Consciousness (Post- Awake and Alert procedure): Post Debridement Measurements of Total Wound Length: (cm) 2.3 Width: (cm) 2.2 Depth: (cm) 0.2 Volume: (cm) 0.795 Character of Wound/Ulcer Post Debridement: Requires Further Debridement Severity of Tissue Post Debridement: Fat layer exposed Post Procedure Diagnosis Same as Pre-procedure Electronic Signature(s) Signed: 01/31/2020 6:01:01 PM By: Deon Pilling Signed: 02/01/2020 8:05:25 AM By: Linton Ham MD Entered By: Linton Ham on 01/31/2020 09:26:10 -------------------------------------------------------------------------------- HPI Details Patient Name: Date of Service: [place] 01/31/2020 8:00 A M Medical Record Number:  [id number] Patient Account Number: [id number] Date of Birth/Sex: Treating RN: 13-May-1941 (79 y.o. [name], [name] Primary Care Provider: [name]., [name] Other Clinician: Referring Provider: Treating Provider/Extender: [name], [name] in Treatment: 16 History of Present Illness HPI Description: ADMISSION 10/11/2019 This is a 79 year old man with a history of a severe cardiomyopathy with an ejection fraction of about 20%, chronic renal failure stage III. He is listed as a type II diabetic  in epic although the patient denies this. He also has a history of PVD. He states for the last month he has had wounds on his bilateral lower extremities that started off as blisters which denuded. He has areas on the left lateral calf and 2 on the right lateral. He has an area on the left first met head which he did not know was there he we identified this on intake. He has been using Silvadene cream provided by his primary care physician but he is complaining that this burns. 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 He was acutely ill at that time complicating GI bleed. ABIs in our clinic were 1.16 on the right and 1.13 on the left 2/25; the patient comes in with his areas on the left lateral and right lateral calf. There is also an area over the left first MTP bunion deformity. We have been using Sorbact. His edema control is fairly good 3/4; left lateral and right lateral calf. Most of his wounds are in the same position tightly adherent nonviable debris. On the right we debrided the superior wound on the left both wounds.  The area on his bunion over the left first MTP medially is I think just about closed. We have been using Sorbact without a lot of success changed to Iodoflex under compression 3/11; left lateral and right lateral calf perhaps minor improvement in the surface condition. We have been using Iodoflex. The area over the bunion of the left first MTP has closed over 3/18; left lateral and right lateral calf not much improvement. We have been using Iodoflex. Aggressive debridement last week. 3/25; left lateral and right lateral calf. We have been using Iodoflex. There is some improvement in the superior area on the right and 2 on the lateral left although there is still a lot of debris on the surface. The inferior area on the right is still a completely nonviable surface. 4/8; left lateral and right lateral calfs. Essentially mirror-image looking wounds 2 wounds on each side in close juxtaposition we have been using Iodoflex with some improvement in the very adherent fibrinous debris but not a lot. The patient has arterial studies next Wednesday morning and venous studies next Thursday morning. I have been avoiding any further aggressive debridement until we see the arterial study results. His ABIs were fairly good in this clinic and is dorsalis pedis pulses are palpable but the wound beds are pale. 4/15; left lateral and right lateral calfs. Essentially mirror-image wounds with 2 wounds on each side in close juxtaposition but with rims of separating normal tissue. We have been using Iodoflex. The base of the wounds has been cleaning up quite nicely ARTERIAL STUDIES were done showing the patient had an ABI on the right of 1.27 with triphasic waveforms and a TBI of 0.90 on the left triphasic waveforms with an ABI of 1.28 and a TBI of 0.87. No evidence of arterial disease VENOUS REFLUX STUDIES; were done yesterday we do not have this report yet. 4/23; VENOUS REFLUX STUDIES did not show any significant reflux  right lower extremity. No evidence of a DVT He did have significant reflux in the common . femoral  vein on the left but nothing else was listed as significant. He did not have a DVT. We have been using Iodoflex under compression his wounds are making progress. 4/29; improvements in the wound surface continue. We have been using Iodoflex. He has a 20% out-of-pocket co-pay for Apligraf which is unfortunate. We changed him to Sorbact today 5/6; started on Sorbact last week. Better looking wound surface but not much change in dimensions. 01/24/20-Patient is back at 3 weeks, wound surfaces are about the same but very minimal slough on the right leg wounds, however there is blistering on both legs adjacent to the wounds, patient denies any other symptoms or new symptoms. His studies have been reviewed and do not indicate any significant arterial or venous disease. But he is attending once in 3 weeks with home health changing in between 6/3; patient has 4 wounds 2 on each side of his lateral lower legs. These wounds are somewhat improved. He apparently arrived in clinic last week with a large blister on the right lateral lower leg that had denuded into the wound. They changed to silver alginate. Still under compression. He has straight Medicare and unfortunately has an unlimited co-pay for advanced treatment options. Electronic Signature(s) Signed: 02/01/2020 8:05:25 AM By: Linton Ham MD Entered By: Linton Ham on 01/31/2020 09:27:07 -------------------------------------------------------------------------------- Physical Exam Details Patient Name: Date of Service: [place] 01/31/2020 8:00 A M Medical Record Number: [id number] Patient Account Number: [id number] Date of Birth/Sex: Treating RN: May 22, 1941 (79 y.o. [name], [name] Primary Care Provider: [name]., [name] Other Clinician: Referring Provider: Treating Provider/Extender: [name], [name] in  Treatment: 16 Constitutional Sitting or standing Blood Pressure is within target range for patient.. Pulse regular and within target range for patient.Marland Kitchen Respirations regular, non-labored and within target range.. Temperature is normal and within the target range for the patient.Marland Kitchen Appears in no distress. Notes Wound exam; I used an open curette to debride the ulcers on the right lateral calf the areas on the left did not require debridement. The area that was new last week is a large denuded blister this is already epithelializing. We have good edema control. Pedal pulses are palpable Electronic Signature(s) Signed: 02/01/2020 8:05:25 AM By: Linton Ham MD Entered By: Linton Ham on 01/31/2020 09:28:05 -------------------------------------------------------------------------------- Physician Orders Details Patient Name: Date of Service: [place] 01/31/2020 8:00 [place] Record Number: [id number] Patient Account Number: [id number] Date of Birth/Sex: Treating RN: October 03, 1940 (79 y.o. 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 Dressing Change Frequency Other: - change twice a week by home health. Skin Barriers/Peri-Wound Care Moisturizing lotion TCA Cream or Ointment - liberally in clinic today mixed with lotion. Wound Cleansing May shower with protection. - use cast protectors on the  days dressings are not changed. May shower and wash wound with soap and water. - with dressing changes only. Primary Wound Dressing Wound #1 Left,Lateral Lower Leg Silver Collagen - moisten with hydrogel.  Wound #3 Right,Proximal,Lateral Lower Leg Silver Collagen - moisten with hydrogel. Wound #4 Right,Distal,Lateral Lower Leg Silver Collagen - moisten with hydrogel. Wound #6 Left,Proximal,Lateral Lower Leg Silver Collagen - moisten with hydrogel. Wound #7 Right,Lateral Lower Leg Silver Collagen - moisten with hydrogel. Calcium Alginate with Silver Secondary Dressing ABD pad Other: - pad left medial first met head for protection. Edema Control 4 layer compression - Bilateral - ensure to wrap from foot to just below calf. Avoid standing for long periods of time Elevate legs to the level of the heart or above for 30 minutes daily and/or when sitting, a frequency of: - throughout the day. [facility] for wound care. [facility] Electronic Signature(s) Signed: 01/31/2020 6:01:01 PM By: Deon Pilling Signed: 02/01/2020 8:05:25 AM By: Linton Ham MD Entered By: Deon Pilling on 01/31/2020 09:06:43 -------------------------------------------------------------------------------- Problem List Details Patient Name: Date of Service: [place] 01/31/2020 8:00 [place] Record Number: [id number] Patient Account Number: [id number] Date of Birth/Sex: Treating RN: 29-Sep-1940 (79 y.o. 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/06/2020 No Yes L97.812 Non-pressure chronic ulcer of other part of right lower leg with fat layer 01/03/2020 No Yes exposed Inactive Problems ICD-10 Code Description Active Date Inactive Date L97.521 Non-pressure chronic ulcer of other part of left foot limited to breakdown of skin 10/11/2019 10/11/2019 Resolved Problems ICD-10 Code Description Active Date Resolved Date 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 10/11/2019 10/11/2019 Electronic Signature(s) Signed: 02/01/2020 8:05:25 AM By: Linton Ham MD Entered By: Linton Ham on 01/31/2020 09:25:35 -------------------------------------------------------------------------------- Progress Note Details Patient Name: Date of Service: [place] 01/31/2020 8:00 [place] Record Number: [id number] Patient Account Number: [id number] Date of Birth/Sex: Treating RN: June 18, 1941 (79 y.o. [name], [name] Primary Care Provider: [name]., [name] Other Clinician: Referring Provider: Treating Provider/Extender: [name], [name] in Treatment: 16 Subjective History of Present Illness (HPI) ADMISSION 10/11/2019 This is a 79 year old man with a history of a severe cardiomyopathy with an ejection fraction of about 20%, chronic renal failure stage III. He is listed as a type II diabetic in epic although the patient denies this. He also has a history of PVD. He states for the last month he has had wounds on his bilateral lower extremities that started off as blisters which denuded. He has areas on the left lateral calf and 2 on the right lateral. He has an area on the left first met head which he did not know was there he we identified this on intake. He has been using Silvadene cream provided by his primary care physician but he is complaining that this burns. Past medical history; acute on chronic  congestive heart failure with a severe cardiomyopathy, history of hypoalbuminemia with an albumin of 1.9 in November, on chronic Coumadin at this point for reasons that are not totally clear, listed as a type II diabetic although the patient denies this, chronic kidney disease stage III, cholangitis with an acute hospital admission from 10/19  through 07/08/2019. He was acutely ill at that time complicating GI bleed. ABIs in our clinic were 1.16 on the right and 1.13 on the left 2/25; the patient comes in with his areas on the left lateral and right lateral calf. There is also an area over the left first MTP bunion deformity. We have been using Sorbact. His edema control is fairly good 3/4; left lateral and right lateral calf. Most of his wounds are in the same position tightly adherent nonviable debris. On the right we debrided the superior wound on the left both wounds. The area on his bunion over the left first MTP medially is I think just about closed. We have been using Sorbact without a lot of success changed to Iodoflex under compression 3/11; left lateral and right lateral calf perhaps minor improvement in the surface condition. We have been using Iodoflex. The area over the bunion of the left first MTP has closed over 3/18; left lateral and right lateral calf not much improvement. We have been using Iodoflex. Aggressive debridement last week. 3/25; left lateral and right lateral calf. We have been using Iodoflex. There is some improvement in the superior area on the right and 2 on the lateral left although there is still a lot of debris on the surface. The inferior area on the right is still a completely nonviable surface. 4/8; left lateral and right lateral calfs. Essentially mirror-image looking wounds 2 wounds on each side in close juxtaposition we have been using Iodoflex with some improvement in the very adherent fibrinous debris but not a lot. The patient has arterial studies next  Wednesday morning and venous studies next Thursday morning. I have been avoiding any further aggressive debridement until we see the arterial study results. His ABIs were fairly good in this clinic and is dorsalis pedis pulses are palpable but the wound beds are pale. 4/15; left lateral and right lateral calfs. Essentially mirror-image wounds with 2 wounds on each side in close juxtaposition but with rims of separating normal tissue. We have been using Iodoflex. The base of the wounds has been cleaning up quite nicely ARTERIAL STUDIES were done showing the patient had an ABI on the right of 1.27 with triphasic waveforms and a TBI of 0.90 on the left triphasic waveforms with an ABI of 1.28 and a TBI of 0.87. No evidence of arterial disease VENOUS REFLUX STUDIES; were done yesterday we do not have this report yet. 4/23; VENOUS REFLUX STUDIES did not show any significant reflux right lower extremity. No evidence of a DVT He did have significant reflux in the common . femoral vein on the left but nothing else was listed as significant. He did not have a DVT. We have been using Iodoflex under compression his wounds are making progress. 4/29; improvements in the wound surface continue. We have been using Iodoflex. He has a 20% out-of-pocket co-pay for Apligraf which is unfortunate. We changed him to Sorbact today 5/6; started on Sorbact last week. Better looking wound surface but not much change in dimensions. 01/24/20-Patient is back at 3 weeks, wound surfaces are about the same but very minimal slough on the right leg wounds, however there is blistering on both legs adjacent to the wounds, patient denies any other symptoms or new symptoms. His studies have been reviewed and do not indicate any significant arterial or venous disease. But he is attending once in 3 weeks with home health changing in between 6/3; patient has 4 wounds 2 on each side of  his lateral lower legs. These wounds are somewhat  improved. He apparently arrived in clinic last week with a large blister on the right lateral lower leg that had denuded into the wound. They changed to silver alginate. Still under compression. He has straight Medicare and unfortunately has an unlimited co-pay for advanced treatment options. Objective Constitutional Sitting or standing Blood Pressure is within target range for patient.. Pulse regular and within target range for patient.Marland Kitchen Respirations regular, non-labored and within target range.. Temperature is normal and within the target range for the patient.Marland Kitchen Appears in no distress. Vitals Time Taken: 7:59 AM, Height: 71 in, Weight: 198 lbs, BMI: 27.6, Temperature: 98.1 F, Pulse: 101 bpm, Respiratory Rate: 20 breaths/min, Blood Pressure: 136/71 mmHg. General Notes: Wound exam; I used an open curette to debride the ulcers on the right lateral calf the areas on the left did not require debridement. The area that was new last week is a large denuded blister this is already epithelializing. We have good edema control. Pedal pulses are palpable Integumentary (Hair, Skin) Wound #1 status is Open. Original cause of wound was Blister. The wound is located on the Left,Lateral Lower Leg. The wound measures 2cm length x 1.4cm width x 0.2cm depth; 2.199cm^2 area and 0.44cm^3 volume. There is Fat Layer (Subcutaneous Tissue) Exposed exposed. There is no tunneling or undermining noted. There is a medium amount of serosanguineous drainage noted. The wound margin is distinct with the outline attached to the wound base. There is medium (34-66%) red granulation within the wound bed. There is a medium (34-66%) amount of necrotic tissue within the wound bed including Adherent Slough. Wound #3 status is Open. Original cause of wound was Blister. The wound is located on the Right,Proximal,Lateral Lower Leg. The wound measures 2.5cm length x 2cm width x 0.2cm depth; 3.927cm^2 area and 0.785cm^3 volume. There is Fat  Layer (Subcutaneous Tissue) Exposed exposed. There is no tunneling or undermining noted. There is a medium amount of purulent drainage noted. The wound margin is well defined and not attached to the wound base. There is medium (34-66%) red granulation within the wound bed. There is a medium (34-66%) amount of necrotic tissue within the wound bed including Adherent Slough. Wound #4 status is Open. Original cause of wound was Blister. The wound is located on the Right,Distal,Lateral Lower Leg. The wound measures 2.3cm length x 2.2cm width x 0.2cm depth; 3.974cm^2 area and 0.795cm^3 volume. There is Fat Layer (Subcutaneous Tissue) Exposed exposed. There is no tunneling or undermining noted. There is a medium amount of purulent drainage noted. The wound margin is distinct with the outline attached to the wound base. There is medium (34-66%) red granulation within the wound bed. There is a medium (34-66%) amount of necrotic tissue within the wound bed including Adherent Slough. Wound #6 status is Open. Original cause of wound was Gradually Appeared. The wound is located on the Left,Proximal,Lateral Lower Leg. The wound measures 2.1cm length x 1.3cm width x 0.2cm depth; 2.144cm^2 area and 0.429cm^3 volume. There is Fat Layer (Subcutaneous Tissue) Exposed exposed. There is no tunneling or undermining noted. There is a medium amount of serosanguineous drainage noted. The wound margin is flat and intact. There is medium (34-66%) red granulation within the wound bed. There is a medium (34-66%) amount of necrotic tissue within the wound bed including Adherent Slough. Wound #7 status is Open. Original cause of wound was Blister. The wound is located on the Right,Lateral Lower Leg. The wound measures 4.7cm length x 2cm width  x 0.1cm depth; 7.383cm^2 area and 0.738cm^3 volume. There is Fat Layer (Subcutaneous Tissue) Exposed exposed. There is no tunneling or undermining noted. There is a medium amount of purulent  drainage noted. The wound margin is flat and intact. There is large (67-100%) red granulation within the wound bed. There is a small (1-33%) amount of necrotic tissue within the wound bed including Adherent Slough. Assessment Active Problems ICD-10 Chronic venous hypertension (idiopathic) with inflammation of bilateral lower extremity Lymphedema, not elsewhere classified Non-pressure chronic ulcer of other part of left lower leg with fat layer exposed Non-pressure chronic ulcer of other part of right lower leg with fat layer exposed Procedures Wound #3 Pre-procedure diagnosis of Wound #3 is a Venous Leg Ulcer located on the Right,Proximal,Lateral Lower Leg .Severity of Tissue Pre Debridement is: Fat layer exposed. There was a Excisional Skin/Subcutaneous Tissue Debridement with a total area of 5 sq cm performed by Ricard Dillon., MD. With the following instrument(s): Curette to remove Viable and Non-Viable tissue/material. Material removed includes Subcutaneous Tissue, Slough, Skin: Dermis, and Fibrin/Exudate after achieving pain control using Lidocaine 4% Topical Solution. A time out was conducted at 09:00, prior to the start of the procedure. A Moderate amount of bleeding was controlled with Silver Nitrate. The procedure was tolerated well with a pain level of 0 throughout and a pain level of 2 following the procedure. Post Debridement Measurements: 2.5cm length x 2cm width x 0.2cm depth; 0.785cm^3 volume. Character of Wound/Ulcer Post Debridement requires further debridement. Severity of Tissue Post Debridement is: Fat layer exposed. 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 There was a Four Layer Compression Therapy Procedure by Deon Pilling, RN. Post procedure Diagnosis Wound #3: Same as Pre-Procedure Wound #4 Pre-procedure diagnosis of Wound #4 is a Venous Leg  Ulcer located on the Right,Distal,Lateral Lower Leg .Severity of Tissue Pre Debridement is: Fat layer exposed. There was a Excisional Skin/Subcutaneous Tissue Debridement with a total area of 5.06 sq cm performed by Ricard Dillon., MD. With the following instrument(s): Curette to remove Viable and Non-Viable tissue/material. Material removed includes Subcutaneous Tissue, Slough, Skin: Dermis, and Fibrin/Exudate after achieving pain control using Lidocaine 4% Topical Solution. A time out was conducted at 09:00, prior to the start of the procedure. A Moderate amount of bleeding was controlled with Silver Nitrate. The procedure was tolerated well with a pain level of 0 throughout and a pain level of 2 following the procedure. Post Debridement Measurements: 2.3cm length x 2.2cm width x 0.2cm depth; 0.795cm^3 volume. Character of Wound/Ulcer Post Debridement requires further debridement. Severity of Tissue Post Debridement is: Fat layer exposed. 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 There was a Four Layer Compression Therapy Procedure by Deon Pilling, RN. Post procedure Diagnosis Wound #4: Same as Pre-Procedure Wound #1 Pre-procedure diagnosis of Wound #1 is a Venous Leg Ulcer located on the Left,Lateral Lower Leg . There was a Four Layer Compression Therapy Procedure by Deon Pilling, RN. Post procedure Diagnosis Wound #1: Same as Pre-Procedure Wound #6 Pre-procedure diagnosis of Wound #6 is a Venous Leg Ulcer located on the Left,Proximal,Lateral Lower Leg . There was a Four Layer Compression Therapy Procedure by Deon Pilling, RN. Post procedure Diagnosis Wound #6: Same as Pre-Procedure Wound #7 Pre-procedure diagnosis of Wound #7 is a Venous Leg Ulcer  located on the Right,Lateral Lower Leg . There was a Four Layer Compression Therapy Procedure by Deon Pilling,  RN. Post procedure Diagnosis Wound #7: Same as Pre-Procedure Plan Follow-up Appointments: Return Appointment in 1 week. Dressing Change Frequency: Other: - change twice a week by home health. Skin Barriers/Peri-Wound Care: Moisturizing lotion TCA Cream or Ointment - liberally in clinic today mixed with lotion. Wound Cleansing: May shower with protection. - use cast protectors on the days dressings are not changed. May shower and wash wound with soap and water. - with dressing changes only. Primary Wound Dressing: Wound #1 Left,Lateral Lower Leg: Silver Collagen - moisten with hydrogel. Wound #3 Right,Proximal,Lateral Lower Leg: Silver Collagen - moisten with hydrogel. Wound #4 Right,Distal,Lateral Lower Leg: Silver Collagen - moisten with hydrogel. Wound #6 Left,Proximal,Lateral Lower Leg: Silver Collagen - moisten with hydrogel. Wound #7 Right,Lateral Lower Leg: Silver Collagen - moisten with hydrogel. Calcium Alginate with Silver Secondary Dressing: ABD pad Other: - pad left medial first met head for protection. Edema Control: 4 layer compression - Bilateral - ensure to wrap from foot to just below calf. Avoid standing for long periods of time Elevate legs to the level of the heart or above for 30 minutes daily and/or when sitting, a frequency of: - throughout the day. Home Health: [facility] for wound care. - [facility] 1. I change the primary dressing to moistened silver collagen in his original wounds 2. Silver alginate to the blistered area from last week 3. 4-layer compression Electronic Signature(s) Signed: 02/01/2020 8:05:25 AM By: Linton Ham MD Entered By: Linton Ham on 01/31/2020 09:28:46 -------------------------------------------------------------------------------- SuperBill Details Patient Name: Date of Service: [place], [place] 01/31/2020 Medical Record Number: [id number] Patient Account Number: [id number] Date  of Birth/Sex: Treating RN: 1941/05/20 (79 y.o. [name], [name] Primary Care Provider: [name]., [name] Other Clinician: Referring Provider: Treating Provider/Extender: [name], [name] in Treatment: 16 Diagnosis Coding ICD-10 Codes Code Description [phone] Chronic venous hypertension (idiopathic) with inflammation of bilateral lower extremity I89.0 Lymphedema, not elsewhere classified L97.822 Non-pressure chronic ulcer of other part of left lower leg with fat layer exposed L97.812 Non-pressure chronic ulcer of other part of right lower leg with fat layer exposed Facility Procedures The patient participates with Medicare or their insurance follows the Medicare Facility Guidelines: CPT4 Code Description Modifier Quantity [id number] 11042 - DEB SUBQ TISSUE 20 SQ CM/< 1 ICD-10 Diagnosis Description K56.256 Non-pressure chronic ulcer of  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

## 2020-02-01 NOTE — Progress Notes (Signed)
[name], [name] ([id number]) Visit Report for 01/31/2020 Arrival Information Details Patient Name: Date of Service: [place] 01/31/2020 8:[address] [place] Record Number: [id number] Patient Account Number: [id number] Date of Birth/Sex: Treating RN: [birth year] (79 y.o. [name] Primary Care [name]: [name]., [name] Other Clinician: Referring [name]: Treating [name]/Extender: [name]., [name] in Treatment: 76 Visit Information History Since Last Visit Added or deleted any medications: No Patient Arrived: Cane Any new allergies or adverse reactions: No Arrival Time: 07:58 Had a fall or experienced change in No Accompanied By: self activities of daily living that may affect Transfer Assistance: None risk of falls: Patient Identification Verified: Yes Signs or symptoms of abuse/neglect since last visito No Secondary Verification Process Completed: Yes Hospitalized since last visit: No Patient Requires Transmission-Based Precautions: No Implantable device outside of the clinic excluding No Patient Has Alerts: Yes cellular tissue based products placed in the center Patient Alerts: Patient on Blood Thinner since last visit: Has Dressing in Place as Prescribed: Yes Pain Present Now: No Electronic Signature(s) Signed: 01/31/2020 9:12:52 AM By: Sandre Kitty Entered By: Sandre Kitty on 01/31/2020 07:58:50 -------------------------------------------------------------------------------- Compression Therapy Details Patient Name: Date of Service: [place] 01/31/2020 8:00 [place] Record Number: [id number] Patient Account Number: [id number] Date of Birth/Sex: Treating RN: 11/21/1940 (79 y.o. [name] Primary Care [name]: [name]., [name] Other Clinician: Referring [name]: Treating [name]/Extender: [name]., [name] in Treatment: 16 Compression Therapy Performed for Wound  Assessment: Wound #1 Left,Lateral Lower Leg Performed By: Clinician Troy Pilling, RN Compression Type: Four Layer Post Procedure Diagnosis Same as Pre-procedure Electronic Signature(s) Signed: 01/31/2020 6:01:01 PM By: [name] Entered By: [name] on 01/31/2020 09:02:40 -------------------------------------------------------------------------------- Compression Therapy Details Patient Name: Date of Service: [name] [facility] 01/31/2020 8:00 A M Medical Record Number: [id number] Patient Account Number: [id number] Date of Birth/Sex: Treating RN: 1941-01-03 (79 y.o. [name] Primary Care [name]: [name]., [name] Other Clinician: Referring [name]: Treating [name]/Extender: [name]., [name] in Treatment: 16 Compression Therapy Performed for Wound Assessment: Wound #3 Right,Proximal,Lateral Lower Leg Performed By: Clinician Troy Pilling, RN Compression Type: Four Layer Post Procedure Diagnosis Same as Pre-procedure Electronic Signature(s) Signed: 01/31/2020 6:01:01 PM By: [name] Entered By: [name] on 01/31/2020 09:02:41 -------------------------------------------------------------------------------- Compression Therapy Details Patient Name: Date of Service: [place] [name] [place] 01/31/2020 8:00 [place] Record Number: [id number] Patient Account Number: [id number] Date of Birth/Sex: Treating RN: 1941/05/05 (79 y.o. [name] Primary Care Shayley Medlin: [name]., [name] Other Clinician: Referring [name]: Treating [name]/Extender: [name]., [name] in Treatment: 16 Compression Therapy Performed for Wound Assessment: Wound #4 Right,Distal,Lateral Lower Leg Performed By: Clinician Troy Pilling, RN Compression Type: Four Layer Post Procedure Diagnosis Same as Pre-procedure Electronic Signature(s) Signed: 01/31/2020 6:01:01 PM By: [name] Entered By: [name] on  01/31/2020 09:02:41 -------------------------------------------------------------------------------- Compression Therapy Details Patient Name: Date of Service: [place] [name] [place] 01/31/2020 8:00 A M Medical Record Number: [id number] Patient Account Number: [id number] Date of Birth/Sex: Treating RN: 02-Dec-1940 (79 y.o. [name], [name] Primary Care [name]: [name]., [name] Other Clinician: Referring [name]: Treating [name]/Extender: [name]., [name] in Treatment: 16 Compression Therapy Performed for Wound Assessment: Wound #6 Left,Proximal,Lateral Lower Leg Performed By: Clinician [name],  Troy Klippel, RN Compression Type: Four Layer Post Procedure Diagnosis Same as Pre-procedure Electronic Signature(s) Signed: 01/31/2020 6:01:01 PM By: [name] Signed: 01/31/2020 6:01:01 PM By: [name] Entered By: [name] on 01/31/2020 09:02:41 -------------------------------------------------------------------------------- Compression Therapy Details Patient Name: Date of Service: [place] 01/31/2020 8:00 A M Medical Record Number: [id number] Patient Account Number: [id number] Date of Birth/Sex: Treating RN: 09-18-1940 (79 y.o. [name] Primary Care [name]: [name]., [name] Other Clinician: Referring [name]: Treating [name]/Extender: [name]., [name] in Treatment: 16 Compression Therapy Performed for Wound Assessment: Wound #7 Right,Lateral Lower Leg Performed By: Clinician Troy Pilling, RN Compression Type: Four Layer Post Procedure Diagnosis Same as Pre-procedure Electronic Signature(s) Signed: 01/31/2020 6:01:01 PM By: [name] Entered By: [name] on 01/31/2020 09:02:41 -------------------------------------------------------------------------------- Encounter Discharge Information Details Patient Name: Date of Service: [place] [name] 01/31/2020 8:00 [place] Record  Number: [id number] Patient Account Number: [id number] Date of Birth/Sex: Treating RN: 02/26/1941 (79 y.o. [name] Primary Care Eun Vermeer: [name]., [name] Other Clinician: Referring Blythe Hartshorn: Treating Jaliah Foody/Extender: [name]., [name] in Treatment: 16 Encounter Discharge Information Items Post Procedure Vitals Discharge Condition: Stable Temperature (F): 98.1 Ambulatory Status: Cane Pulse (bpm): 101 Discharge Destination: Home Respiratory Rate (breaths/min): 18 Transportation: Other Blood Pressure (mmHg): 136/71 Accompanied By: self Schedule Follow-up Appointment: Yes Clinical Summary of Care: Patient Declined Notes scat Electronic Signature(s) Signed: 01/31/2020 5:32:26 PM By: Baruch Gouty RN, BSN Entered By: Baruch Gouty on 01/31/2020 09:36:34 -------------------------------------------------------------------------------- Lower Extremity Assessment Details Patient Name: Date of Service: [place] 01/31/2020 8:00 A M Medical Record Number: [id number] Patient Account Number: [id number] Date of Birth/Sex: Treating RN: October 17, 1940 (79 y.o. [name] Primary Care Askia Hazelip: [name]., [name] Other Clinician: Referring Kathyrn Warmuth: Treating Eugune Sine/Extender: [name]., [name] in Treatment: 16 Edema Assessment Assessed: [Left: No] [Right: No] Edema: [Left: Yes] [Right: Yes] Calf Left: Right: Point of Measurement: 43 cm From Medial Instep 37 cm 35.4 cm Ankle Left: Right: Point of Measurement: 14 cm From Medial Instep 21.4 cm 23.3 cm Vascular Assessment Pulses: Dorsalis Pedis Palpable: [Left:Yes] [Right:Yes] Electronic Signature(s) Signed: 01/31/2020 5:32:26 PM By: Baruch Gouty RN, BSN Entered By: Baruch Gouty on 01/31/2020 08:35:42 -------------------------------------------------------------------------------- Multi Wound Chart Details Patient Name: Date of Service: [place] 01/31/2020 8:00 A M Medical Record Number: [id number] Patient Account Number: [id number] Date of Birth/Sex: Treating RN: 08-29-1941 (79 y.o. [name], [name] Primary Care Romell Wolden: [name]., [name] Other Clinician: Referring Daemon Dowty: Treating Magic Mohler/Extender: [name]., [name] in Treatment: 16 Vital Signs Height(in): 71 Pulse(bpm): 101 Weight(lbs): 198 Blood Pressure(mmHg): 136/71 Body Mass Index(BMI): 28 Temperature(F): 98.1 Respiratory Rate(breaths/min): 20 Photos: [1:No Photos Left, Lateral Lower Leg] [3:No Photos Right, Proximal, Lateral Lower Leg] [4:No Photos Right, Distal, Lateral Lower Leg] Wound Location: [1:Blister] [3:Blister] [4:Blister] Wounding Event: [1:Venous Leg Ulcer] [3:Venous Leg Ulcer] [4:Venous Leg Ulcer] Primary Etiology: [1:Anemia, Congestive Heart Failure,] [3:Anemia, Congestive Heart Failure,] [4:Anemia, Congestive Heart Failure,] Comorbid History: [1:Hypertension, Peripheral Venous Disease, 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] [3:Hypertension, Peripheral Venous Disease, 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] [4:Hypertension, Peripheral Venous Disease, 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] Date Acquired: [1:16] [3:16] [4:16] Weeks of Treatment: [1:Open] [3:Open] [4:Open] Wound Status: [1:Yes] [3:No] [4:No] Clustered Wound: [1:1] [3:N/A] [4:N/A] Clustered Quantity: [1:2x1.4x0.2] [3:2.5x2x0.2] [4:2.3x2.2x0.2] Measurements L x W x D (  cm) [1:2.199] [3:3.927] [4:3.974] A (cm) : rea [1:0.44] [3:0.785] [4:0.795] Volume (cm) : [1:96.30%] [3:20.60%] [4:57.30%] % Reduction in Area: [1:92.50%] [3:-58.60%] [4:14.50%] % Reduction in Volume: [1:Full Thickness Without Exposed] [3:Full Thickness Without Exposed] [4:Full Thickness Without Exposed] Classification: [1:Support Structures Medium] [3:Support Structures Medium] [4:Support Structures Medium] Exudate Amount: [1:Serosanguineous] [3:Purulent] [4:Purulent] Exudate Type:  [1:red, brown] [3:yellow, brown, green] [4:yellow, brown, green] Exudate Color: [1:Distinct, outline attached] [3:Well defined, not attached] [4:Distinct, outline attached] Wound Margin: [1:Medium (34-66%)] [3:Medium (34-66%)] [4:Medium (34-66%)] Granulation Amount: [1:Red] [3:Red] [4:Red] Granulation Quality: [1:Medium (34-66%)] [3:Medium (34-66%)] [4:Medium (34-66%)] Necrotic Amount: [1:Fat Layer (Subcutaneous Tissue)] [3:Fat Layer (Subcutaneous Tissue)] [4:Fat Layer (Subcutaneous Tissue)] Exposed Structures: [1:Exposed: Yes Fascia: No Tendon: No Muscle: No Joint: No Bone: No Small (1-33%)] [3:Exposed: Yes Fascia: No Tendon: No Muscle: No Joint: No Bone: No Small (1-33%)] [4:Exposed: Yes Fascia: No Tendon: No Muscle: No Joint: No Bone: No Small (1-33%)] Epithelialization: [1:N/A] [3:Debridement - Excisional] [4:Debridement - Excisional] Debridement: Pre-procedure Verification/Time Out N/A [3:09:00] [4:09:00] Taken: [1:N/A] [3:Lidocaine 4% T opical Solution] [4:Lidocaine 4% T opical Solution] Pain Control: [1:N/A] [3:Subcutaneous, Slough] [4:Subcutaneous, Slough] Tissue Debrided: [1:N/A] [3:Skin/Subcutaneous Tissue] [4:Skin/Subcutaneous Tissue] Level: [1:N/A] [3:5] [4:5.06] Debridement A (sq cm): [1:rea N/A] [3:Curette] [4:Curette] Instrument: [1:N/A] [3:Moderate] [4:Moderate] Bleeding: [1:N/A] [3:Silver Nitrate] [4:Silver Nitrate] Hemostasis A chieved: [1:N/A] [3:0] [4:0] Procedural Pain: [1:N/A] [3:2] [4:2] Post Procedural Pain: [1:N/A] [3:Procedure was tolerated well] [4:Procedure was tolerated well] Debridement Treatment Response: [1:N/A] [3:2.5x2x0.2] [4:2.3x2.2x0.2] Post Debridement Measurements L x W x D (cm) [1:N/A] [3:0.785] [4:0.795] Post Debridement Volume: (cm) [1:Compression Therapy] [3:Compression Therapy] [4:Compression Therapy] Procedures Performed: [1:6] [3:Debridement 7] [4:Debridement N/A] Photos: [1:No Photos Left, Proximal, Lateral Lower Leg] [3:No Photos Right,  Lateral Lower Leg] [4:N/A N/A] Wound Location: [1:Gradually Appeared] [3:Blister] [4:N/A] Wounding Event: [1:Venous Leg Ulcer] [3:Venous Leg Ulcer] [4:N/A] Primary Etiology: [1:Anemia, Congestive Heart Failure,] [3:Anemia, Congestive Heart Failure,] [4:N/A] Comorbid History: [1:Hypertension, Peripheral Venous Disease, End Stage Renal Disease 12/13/2019] [3:Hypertension, Peripheral Venous Disease, End Stage Renal Disease 01/24/2020] [4:N/A] Date A cquired: [1:7] [3:1] [4:N/A] Weeks of Treatment: [1:Open] [3:Open] [4:N/A] Wound Status: [1:No] [3:No] [4:N/A] Clustered Wound: [1:N/A] [3:N/A] [4:N/A] Clustered Quantity: [1:2.1x1.3x0.2] [3:4.7x2x0.1] [4:N/A] Measurements L x W x D (cm) [1:2.144] [3:7.383] [4:N/A] A (cm) : rea [1:0.429] [3:0.738] [4:N/A] Volume (cm) : [1:35.80%] [3:70.60%] [4:N/A] % Reduction in A [1:rea: 57.10%] [3:70.60%] [4:N/A] % Reduction in Volume: [1:Full Thickness Without Exposed] [3:Full Thickness Without Exposed] [4:N/A] Classification: [1:Support Structures Medium] [3:Support Structures Medium] [4:N/A] Exudate A mount: [1:Serosanguineous] [3:Purulent] [4:N/A] Exudate Type: [1:red, brown] [3:yellow, brown, green] [4:N/A] Exudate Color: [1:Flat and Intact] [3:Flat and Intact] [4:N/A] Wound Margin: [1:Medium (34-66%)] [3:Large (67-100%)] [4:N/A] Granulation A mount: [1:Red] [3:Red] [4:N/A] Granulation Quality: [1:Medium (34-66%)] [3:Small (1-33%)] [4:N/A] Necrotic A mount: [1:Fat Layer (Subcutaneous Tissue)] [3:Fat Layer (Subcutaneous Tissue)] [4:N/A] Exposed Structures: [1:Exposed: Yes Fascia: No Tendon: No Muscle: No Joint: No Bone: No Small (1-33%)] [3:Exposed: Yes Fascia: No Tendon: No Muscle: No Joint: No Bone: No Small (1-33%)] [4:N/A] Epithelialization: [1:N/A] [3:N/A] [4:N/A] Debridement: [1:N/A] [3:N/A] [4:N/A] Pain Control: [1:N/A] [3:N/A] [4:N/A] Tissue Debrided: [1:N/A] [3:N/A] [4:N/A] Level: [1:N/A] [3:N/A] [4:N/A] Debridement A (sq cm): [1:rea N/A]  [3:N/A] [4:N/A] Instrument: [1:N/A] [3:N/A] [4:N/A] Bleeding: [1:N/A] [3:N/A] [4:N/A] Hemostasis A chieved: [1:N/A] [3:N/A] [4:N/A] Procedural Pain: [1:N/A] [3:N/A] [4:N/A] Post Procedural Pain: Debridement Treatment Response: N/A [3:N/A] [4:N/A] Post Debridement Measurements L x N/A [3:N/A] [4:N/A] W x D (cm) [1:N/A] [3:N/A] [4:N/A] Post Debridement Volume: (cm) [1:Compression Therapy] [3:Compression Therapy] [4:N/A] Procedures Performed: Treatment Notes Electronic Signature(s) Signed: 01/31/2020 6:01:01 PM  By: [name] Signed: 02/01/2020 8:05:25 AM By: Linton Ham MD Entered By: Linton Ham on 01/31/2020 09:25:46 -------------------------------------------------------------------------------- Multi-Disciplinary Care Plan Details Patient Name: Date of Service: [place] 01/31/2020 8:00 [place] Record Number: [id number] Patient Account Number: [id number] Date of Birth/Sex: Treating RN: [birth year] (80 y.o. [name], [name] Primary Care Keygan Dumond: [name]., [name] Other Clinician: Referring Kyiesha Millward: Treating Shiva Karis/Extender: [name]., [name] in Treatment: 26 Active Inactive Abuse / Safety / Falls / Self Care Management Nursing Diagnoses: Potential for falls Goals: Patient will remain injury free related to falls Date Initiated: 10/11/2019 Target Resolution Date: 02/08/2020 Goal Status: Active Patient/caregiver will verbalize understanding of the importance to maintain current immunizations/vaccinations Date Initiated: 10/11/2019 Date Inactivated: 11/08/2019 Target Resolution Date: 12/06/2019 Goal Status: Met Interventions: Assess self care needs on admission and as needed Provide education on fall prevention Treatment Activities: Patient referred to home care : 10/11/2019 Notes: Nutrition Nursing Diagnoses: Potential for alteratiion in Nutrition/Potential for imbalanced nutrition Goals: Patient/caregiver agrees to and  verbalizes understanding of need to obtain nutritional consultation Date Initiated: 10/11/2019 Date Inactivated: 11/01/2019 Target Resolution Date: 11/09/2019 Goal Status: Met Patient/caregiver agrees to and verbalizes understanding of need to use nutritional supplements and/or vitamins as prescribed Date Initiated: 10/11/2019 Target Resolution Date: 02/08/2020 Goal Status: Active Interventions: Assess HgA1c results as ordered upon admission and as needed Provide education on nutrition Treatment Activities: Education provided on Nutrition : 12/27/2019 Obtain HgA1c : 10/11/2019 Patient referred to Primary Care Physician for further nutritional evaluation : 10/11/2019 Notes: Electronic Signature(s) Signed: 01/31/2020 6:01:01 PM By: [name] Entered By: [name] on 01/31/2020 08:14:00 -------------------------------------------------------------------------------- Pain Assessment Details Patient Name: Date of Service: [place] [employer] [name] 01/31/2020 8:00 [place] Record Number: [id number] Patient Account Number: [id number] Date of Birth/Sex: Treating RN: 11-22-1940 (79 y.o. [name], [name] Primary Care Lebert Lovern: [name]., [name] Other Clinician: Referring Shade Rivenbark: Treating Laurie Lovejoy/Extender: [name]., [name] in Treatment: 16 Active Problems Location of Pain Severity and Description of Pain Patient Has Paino No Site Locations Rate the pain. Current Pain Level: 0 Pain Management and Medication Current Pain Management: Electronic Signature(s) Signed: 01/31/2020 5:32:26 PM By: Baruch Gouty RN, BSN Signed: 01/31/2020 6:01:01 PM By: [name] Entered By: Baruch Gouty on 01/31/2020 08:34:18 -------------------------------------------------------------------------------- Patient/Caregiver Education Details Patient Name: Date of Service: [place] 6/3/2021andnbsp8:00 [place] Record Number: [id number] Patient Account Number:  [id number] Date of Birth/Gender: Treating RN: [birth year] (79 y.o. [name] Primary Care Physician: [name]., [name] Other Clinician: Referring Physician: Treating Physician/Extender: [name]., [name] in Treatment: 16 Education Assessment Education Provided To: Patient Education Topics Provided Nutrition: Handouts: Nutrition Methods: Explain/Verbal Responses: Reinforcements needed Electronic Signature(s) Signed: 01/31/2020 6:01:01 PM By: [name] Entered By: [name] on 01/31/2020 08:14:11 -------------------------------------------------------------------------------- Wound Assessment Details Patient Name: Date of Service: [name] [facility] 01/31/2020 8:00 [place] Record Number: [id number] Patient Account Number: [id number] Date of Birth/Sex: Treating RN: 1941-02-18 (79 y.o. [name], [name] Primary Care Cailean Heacock: [name]., [name] Other Clinician: Referring Tunya Held: Treating Delan Ksiazek/Extender: [name]., [name] in Treatment: 16 Wound Status Wound Number: 1 Primary Venous Leg Ulcer Etiology: Wound Location: Left, Lateral Lower Leg Wound Open Wounding Event: Blister Status: Date Acquired: 09/11/2019 Comorbid Anemia, Congestive Heart Failure, Hypertension, Peripheral Weeks Of Treatment: 16 History:  Venous Disease, End Stage Renal Disease Clustered Wound: Yes Wound Measurements Length: (cm) 2 Width: (cm) 1.4 Depth: (cm) 0.2 Clustered Quantity: 1 Area: (cm) 2.199 Volume: (cm) 0.44 % Reduction in Area: 96.3% % Reduction in Volume: 92.5% Epithelialization: Small (1-33%) Tunneling: No Undermining: No Wound Description Classification: Full Thickness Without Exposed Support Structures Wound Margin: Distinct, outline attached Exudate Amount: Medium Exudate Type: Serosanguineous Exudate Color: red, brown Foul Odor After Cleansing: No Slough/Fibrino Yes Wound Bed Granulation Amount:  Medium (34-66%) Exposed Structure Granulation Quality: Red Fascia Exposed: No Necrotic Amount: Medium (34-66%) Fat Layer (Subcutaneous Tissue) Exposed: Yes Necrotic Quality: Adherent Slough Tendon Exposed: No Muscle Exposed: No Joint Exposed: No Bone Exposed: No Treatment Notes Wound #1 (Left, Lateral Lower Leg) 2. Periwound Care Moisturizing lotion TCA Cream 3. Primary Dressing Applied Collegen AG 4. Secondary Dressing ABD Pad Dry Gauze 6. Support Layer Applied 4 layer compression Water quality scientist) Signed: 01/31/2020 5:32:26 PM By: Baruch Gouty RN, BSN Signed: 01/31/2020 6:01:01 PM By: [name] Entered By: Baruch Gouty on 01/31/2020 08:36:22 -------------------------------------------------------------------------------- Wound Assessment Details Patient Name: Date of Service: [place] 01/31/2020 8:00 [place] Record Number: [id number] Patient Account Number: [id number] Date of Birth/Sex: Treating RN: 1941/03/29 (79 y.o. [name], [name] Primary Care Taichi Repka: [name]., [name] Other Clinician: Referring Aigner Horseman: Treating Amenah Tucci/Extender: [name]., [name] in Treatment: 16 Wound Status Wound Number: 3 Primary Venous Leg Ulcer Etiology: Wound Location: Right, Proximal, Lateral Lower Leg Wound Open Wounding Event: Blister Status: Date Acquired: 09/11/2019 Comorbid Anemia, Congestive Heart Failure, Hypertension, Peripheral Weeks Of Treatment: 16 History: Venous Disease, End Stage Renal Disease Clustered Wound: No Wound Measurements Length: (cm) 2.5 Width: (cm) 2 Depth: (cm) 0.2 Area: (cm) 3.927 Volume: (cm) 0.785 % Reduction in Area: 20.6% % Reduction in Volume: -58.6% Epithelialization: Small (1-33%) Tunneling: No Undermining: No Wound Description Classification: Full Thickness Without Exposed Support Structures Wound Margin: Well defined, not attached Exudate Amount: Medium Exudate Type:  Purulent Exudate Color: yellow, brown, green Foul Odor After Cleansing: No Slough/Fibrino Yes Wound Bed Granulation Amount: Medium (34-66%) Exposed Structure Granulation Quality: Red Fascia Exposed: No Necrotic Amount: Medium (34-66%) Fat Layer (Subcutaneous Tissue) Exposed: Yes Necrotic Quality: Adherent Slough Tendon Exposed: No Muscle Exposed: No Joint Exposed: No Bone Exposed: No Treatment Notes Wound #3 (Right, Proximal, Lateral Lower Leg) 2. Periwound Care Moisturizing lotion TCA Cream 3. Primary Dressing Applied Collegen AG 4. Secondary Dressing ABD Pad Dry Gauze 6. Support Layer Applied 4 layer compression Water quality scientist) Signed: 01/31/2020 5:32:26 PM By: Baruch Gouty RN, BSN Signed: 01/31/2020 6:01:01 PM By: [name] Entered By: Baruch Gouty on 01/31/2020 08:36:51 -------------------------------------------------------------------------------- Wound Assessment Details Patient Name: Date of Service: [place] 01/31/2020 8:00 A M Medical Record Number: [id number] Patient Account Number: [id number] Date of Birth/Sex: Treating RN: 04-11-1941 (79 y.o. [name], [name] Primary Care Chima Astorino: [name]., [name] Other Clinician: Referring Noora Locascio: Treating Susi Goslin/Extender: [name]., [name] in Treatment: 16 Wound Status Wound Number: 4 Primary Venous Leg Ulcer Etiology: Wound Location: Right, Distal, Lateral Lower Leg Wound Open Wounding Event: Blister Status: Date Acquired: 09/11/2019 Comorbid Anemia, Congestive Heart Failure, Hypertension, Peripheral Weeks Of Treatment: 16 History: Venous Disease, End Stage Renal Disease Clustered Wound: No Wound Measurements Length: (cm) 2.3 Width: (cm) 2.2 Depth: (cm) 0.2 Area: (cm) 3.974 Volume: (cm) 0.795 % Reduction in Area: 57.3% % Reduction in Volume: 14.5% Epithelialization: Small (1-33%) Tunneling:  No Undermining: No Wound  Description Classification: Full Thickness Without Exposed Support Structures Wound Margin: Distinct, outline attached Exudate Amount: Medium Exudate Type: Purulent Exudate Color: yellow, brown, green Foul Odor After Cleansing: No Slough/Fibrino Yes Wound Bed Granulation Amount: Medium (34-66%) Exposed Structure Granulation Quality: Red Fascia Exposed: No Necrotic Amount: Medium (34-66%) Fat Layer (Subcutaneous Tissue) Exposed: Yes Necrotic Quality: Adherent Slough Tendon Exposed: No Muscle Exposed: No Joint Exposed: No Bone Exposed: No Treatment Notes Wound #4 (Right, Distal, Lateral Lower Leg) 2. Periwound Care Moisturizing lotion TCA Cream 3. Primary Dressing Applied Collegen AG 4. Secondary Dressing ABD Pad Dry Gauze 6. Support Layer Applied 4 layer compression Water quality scientist) Signed: 01/31/2020 5:32:26 PM By: Baruch Gouty RN, BSN Signed: 01/31/2020 6:01:01 PM By: [name] Entered By: Baruch Gouty on 01/31/2020 08:37:53 -------------------------------------------------------------------------------- Wound Assessment Details Patient Name: Date of Service: [place] 01/31/2020 8:00 A M Medical Record Number: [id number] Patient Account Number: [id number] Date of Birth/Sex: Treating RN: [birth year] (79 y.o. [name], [name] Primary Care Era Parr: [name]., [name] Other Clinician: Referring Chandani Rogowski: Treating Laiyla Slagel/Extender: [name]., [name] in Treatment: 16 Wound Status Wound Number: 6 Primary Venous Leg Ulcer Etiology: Wound Location: Left, Proximal, Lateral Lower Leg Wound Open Wounding Event: Gradually Appeared Status: Date Acquired: 12/13/2019 Comorbid Anemia, Congestive Heart Failure, Hypertension, Peripheral Weeks Of Treatment: 7 History: Venous Disease, End Stage Renal Disease Clustered Wound: No Wound Measurements Length: (cm) 2.1 Width: (cm) 1.3 Depth: (cm) 0.2 Area: (cm)  2.144 Volume: (cm) 0.429 % Reduction in Area: 35.8% % Reduction in Volume: 57.1% Epithelialization: Small (1-33%) Tunneling: No Undermining: No Wound Description Classification: Full Thickness Without Exposed Support Structures Wound Margin: Flat and Intact Exudate Amount: Medium Exudate Type: Serosanguineous Exudate Color: red, brown Foul Odor After Cleansing: No Slough/Fibrino Yes Wound Bed Granulation Amount: Medium (34-66%) Exposed Structure Granulation Quality: Red Fascia Exposed: No Necrotic Amount: Medium (34-66%) Fat Layer (Subcutaneous Tissue) Exposed: Yes Necrotic Quality: Adherent Slough Tendon Exposed: No Muscle Exposed: No Joint Exposed: No Bone Exposed: No Treatment Notes Wound #6 (Left, Proximal, Lateral Lower Leg) 2. Periwound Care Moisturizing lotion TCA Cream 3. Primary Dressing Applied Collegen AG 4. Secondary Dressing ABD Pad Dry Gauze 6. Support Layer Applied 4 layer compression Water quality scientist) Signed: 01/31/2020 5:32:26 PM By: Baruch Gouty RN, BSN Signed: 01/31/2020 6:01:01 PM By: [name] Entered By: Baruch Gouty on 01/31/2020 08:38:29 -------------------------------------------------------------------------------- Wound Assessment Details Patient Name: Date of Service: [place] 01/31/2020 8:00 A M Medical Record Number: [id number] Patient Account Number: [id number] Date of Birth/Sex: Treating RN: December 27, 1940 (79 y.o. [name], [name] Primary Care Arnitra Sokoloski: [name]., [name] Other Clinician: Referring Naitik Hermann: Treating Deklan Minar/Extender: [name]., [name] in Treatment: 16 Wound Status Wound Number: 7 Primary Venous Leg Ulcer Etiology: Wound Location: Right, Lateral Lower Leg Wound Open Wounding Event: Blister Status: Date Acquired: 01/24/2020 Comorbid Anemia, Congestive Heart Failure, Hypertension, Peripheral Weeks Of Treatment: 1 History: Venous Disease, End Stage Renal  Disease Clustered Wound: No Wound Measurements Length: (cm) 4.7 Width: (cm) 2 Depth: (cm) 0.1 Area: (cm) 7.383 Volume: (cm) 0.738 % Reduction in Area: 70.6% % Reduction in Volume: 70.6% Epithelialization: Small (1-33%) Tunneling: No Undermining: No Wound Description Classification: Full Thickness Without Exposed Support Structures Wound Margin: Flat and Intact Exudate Amount: Medium Exudate Type: Purulent Exudate Color: yellow, brown, green Foul Odor After Cleansing: No Slough/Fibrino No Wound Bed Granulation Amount: Large (67-100%) Exposed  Structure Granulation Quality: Red Fascia Exposed: No Necrotic Amount: Small (1-33%) Fat Layer (Subcutaneous Tissue) Exposed: Yes Necrotic Quality: Adherent Slough Tendon Exposed: No Muscle Exposed: No Joint Exposed: No Bone Exposed: No Treatment Notes Wound #7 (Right, Lateral Lower Leg) 2. Periwound Care Moisturizing lotion TCA Cream 3. Primary Dressing Applied Collegen AG 4. Secondary Dressing ABD Pad Dry Gauze 6. Support Layer Applied 4 layer compression Water quality scientist) Signed: 01/31/2020 5:32:26 PM By: Baruch Gouty RN, BSN Signed: 01/31/2020 6:01:01 PM By: [name] Entered By: Baruch Gouty on 01/31/2020 08:39:11 -------------------------------------------------------------------------------- [place] Details Patient Name: Date of Service: [place] 01/31/2020 8:00 [place] Record Number: [id number] Patient Account Number: [id number] Date of Birth/Sex: Treating RN: 10/14/1940 (79 y.o. [name], [name] Primary Care Danise Dehne: [name]., [name] Other Clinician: Referring Elizabth Palka: Treating Taven Strite/Extender: [name]., [name] in Treatment: 16 Vital Signs Time Taken: 07:59 Temperature (F): 98.1 Height (in): 71 Pulse (bpm): 101 Weight (lbs): 198 Respiratory Rate (breaths/min): 20 Body Mass Index (BMI): 27.6 Blood Pressure (mmHg): 136/71 Reference  Range: 80 - 120 mg / dl Electronic Signature(s) Signed: 01/31/2020 9:12:52 AM By: Sandre Kitty Entered By: Sandre Kitty on 01/31/2020 08:01:31

## 2020-02-07 ENCOUNTER — Encounter (HOSPITAL_BASED_OUTPATIENT_CLINIC_OR_DEPARTMENT_OTHER): Payer: Medicare Other | Admitting: Internal Medicine

## 2020-02-07 ENCOUNTER — Other Ambulatory Visit: Payer: Self-pay

## 2020-02-07 DIAGNOSIS — E11621 Type 2 diabetes mellitus with foot ulcer: Secondary | ICD-10-CM | POA: Diagnosis not present

## 2020-02-11 NOTE — Progress Notes (Signed)
AADYN, BUCHHEIT ([id number]) Visit Report for 02/07/2020 HPI Details Patient Name: Date of Service: [place] 02/07/2020 8:00 [place] Record Number: [id number] Patient Account Number: [id number] Date of Birth/Sex: Treating RN: [birth year] (79 y.o. [name], [name] Primary Care Provider: [name]., [name] Other Clinician: Referring Provider: Treating Provider/Extender: [name], [name] in Treatment: 17 History of Present Illness HPI Description: ADMISSION 10/11/2019 This is a 79 year old man with a history of a severe cardiomyopathy with an ejection fraction of about 20%, chronic renal failure stage III. He is listed as a type II diabetic in epic although the patient denies this. He also has a history of PVD. He states for the last month he has had wounds on his bilateral lower extremities that started off as blisters which denuded. He has areas on the left lateral calf and 2 on the right lateral. He has an area on the left first met head which he did not know was there he we identified this on intake. He has been using Silvadene cream provided by his primary care physician but he is complaining that this burns. 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 He was acutely ill at that time complicating GI bleed. ABIs in our clinic were 1.16 on the right and 1.13 on the left 2/25; the patient comes in with his areas on the left lateral and right lateral calf. There is also an area over the left first MTP bunion deformity. We have been using Sorbact. His edema control is fairly good 3/4; left lateral and  right lateral calf. Most of his wounds are in the same position tightly adherent nonviable debris. On the right we debrided the superior wound on the left both wounds. The area on his bunion over the left first MTP medially is I think just about closed. We have been using Sorbact without a lot of success changed to Iodoflex under compression 3/11; left lateral and right lateral calf perhaps minor improvement in the surface condition. We have been using Iodoflex. The area over the bunion of the left first MTP has closed over 3/18; left lateral and right lateral calf not much improvement. We have been using Iodoflex. Aggressive debridement last week. 3/25; left lateral and right lateral calf. We have been using Iodoflex. There is some improvement in the superior area on the right and 2 on the lateral left although there is still a lot of debris on the surface. The inferior area on the right is still a completely nonviable surface. 4/8; left lateral and right lateral calfs. Essentially mirror-image looking wounds 2 wounds on each side in close juxtaposition we have been using Iodoflex with some improvement in the very adherent fibrinous debris but not a lot. The patient has arterial studies next Wednesday morning and venous studies next Thursday morning. I have been avoiding any further aggressive debridement until we see the arterial study results. His ABIs were fairly good in this clinic and is dorsalis pedis pulses are palpable but the wound beds are pale. 4/15; left lateral and right lateral calfs. Essentially mirror-image wounds with 2 wounds on each side in close juxtaposition  but with rims of separating normal tissue. We have been using Iodoflex. The base of the wounds has been cleaning up quite nicely ARTERIAL STUDIES were done showing the patient had an ABI on the right of 1.27 with triphasic waveforms and a TBI of 0.90 on the left triphasic waveforms with an ABI of 1.28 and a TBI of 0.87. No  evidence of arterial disease VENOUS REFLUX STUDIES; were done yesterday we do not have this report yet. 4/23; VENOUS REFLUX STUDIES did not show any significant reflux right lower extremity. No evidence of a DVT He did have significant reflux in the common . femoral vein on the left but nothing else was listed as significant. He did not have a DVT. We have been using Iodoflex under compression his wounds are making progress. 4/29; improvements in the wound surface continue. We have been using Iodoflex. He has a 20% out-of-pocket co-pay for Apligraf which is unfortunate. We changed him to Sorbact today 5/6; started on Sorbact last week. Better looking wound surface but not much change in dimensions. 01/24/20-Patient is back at 3 weeks, wound surfaces are about the same but very minimal slough on the right leg wounds, however there is blistering on both legs adjacent to the wounds, patient denies any other symptoms or new symptoms. His studies have been reviewed and do not indicate any significant arterial or venous disease. But he is attending once in 3 weeks with home health changing in between 6/3; patient has 4 wounds 2 on each side of his lateral lower legs. These wounds are somewhat improved. He apparently arrived in clinic last week with a large blister on the right lateral lower leg that had denuded into the wound. They changed to silver alginate. Still under compression. He has straight Medicare and unfortunately has an unlimited co-pay for advanced treatment options. 6/10; his original 4 wounds are all better especially on the left lateral lower leg. 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 Electronic Signature(s) Signed: 02/11/2020 8:32:12 AM By: Linton Ham MD Entered By: Linton Ham on 02/07/2020 09:21:02 -------------------------------------------------------------------------------- Physical Exam Details Patient Name: Date of Service: [place] 02/07/2020 8:00 [place] Record Number: [id number] Patient Account Number: [id number] Date of Birth/Sex: Treating RN: 1941/03/21 (79 y.o. [name], [name] Primary Care Provider: [name]., [name] Other Clinician: Referring Provider: Treating Provider/Extender: [name], [name] in Treatment: 17 Constitutional Sitting or standing Blood Pressure is within target range for patient.. Pulse regular and within target range for patient.Marland Kitchen Respirations regular, non-labored and within target range.. Temperature is normal and within the target range for the patient.Marland Kitchen Appears in no distress. Cardiovascular Pedal pulses palpable and strong bilaterally.. Musculoskeletal Left second toe there is no tenderness the DIP seems intact. Ligamentous structures seemed intact. Notes Wound exam; No debridement was required in any area. The areas on the left lateral calf x2 look really quite healthy to the The areas on the right lateral calf x2 not quite as healthy as the left but really a lot better. No mechanical debridement was felt to be necessary The superficial  area on the right lateral calf that he came in last week is healthy looking and smaller He has an abrasion at the base of the second left toenail from stubbing his toe. Electronic Signature(s) Signed: 02/11/2020 8:32:12 AM By: Linton Ham MD Entered By: Linton Ham on 02/07/2020 09:22:40 -------------------------------------------------------------------------------- Physician Orders Details Patient Name: Date of Service: [place] 02/07/2020 8:00 [place] Record Number:  [id number] Patient Account Number: [id number] Date of Birth/Sex: Treating RN: 1940-10-31 (79 y.o. 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 Dressing Change Frequency Other: - change twice a week by home health. Skin Barriers/Peri-Wound Care Moisturizing lotion TCA Cream or Ointment - liberally in clinic today mixed with lotion. Wound Cleansing May shower with protection. - use cast protectors on the days dressings are not changed. May shower and wash wound with soap and water. - with dressing changes only. Primary Wound Dressing Wound #1 Left,Lateral Lower Leg Silver Collagen - moisten with hydrogel. Wound #3 Right,Proximal,Lateral Lower Leg Silver Collagen - moisten with hydrogel. Wound #4 Right,Distal,Lateral Lower Leg Silver Collagen - moisten with hydrogel. Wound #6 Left,Proximal,Lateral Lower Leg Silver Collagen - moisten with hydrogel. Wound #7 Right,Lateral Lower Leg Calcium Alginate with Silver Wound #8 Left T Second oe Other: - polysporin and a bandaid to 2nd toe change daily. Secondary Dressing ABD pad Other: - pad left medial first met head for protection. Edema Control 4 layer compression - Bilateral - ensure to wrap from foot to just below calf. Avoid standing for long periods of time Elevate legs to the level of the heart or above for 30 minutes daily and/or when sitting, a  frequency of: - throughout the day. [facility] for wound care. [facility] Electronic Signature(s) Signed: 02/07/2020 5:25:48 PM By: Deon Pilling Signed: 02/11/2020 8:32:12 AM By: Linton Ham MD Entered By: Deon Pilling on 02/07/2020 08:31:08 -------------------------------------------------------------------------------- Problem List Details Patient Name: Date of Service: [place] 02/07/2020 8:00 [place] Record Number: [id number] Patient Account Number: [id number] Date of Birth/Sex: Treating RN: 05-15-1941 (79 y.o. 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/06/2020 No Yes L97.812 Non-pressure chronic ulcer of other part of right lower leg with fat layer 01/03/2020 No Yes exposed S90.812D Abrasion, left foot, subsequent encounter 02/07/2020 No Yes Inactive Problems ICD-10 Code Description Active Date Inactive Date L97.521 Non-pressure chronic ulcer of other part of left foot limited to breakdown of skin 10/11/2019 10/11/2019 Resolved Problems ICD-10 Code Description Active Date Resolved Date 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 10/11/2019 10/11/2019 Electronic  Signature(s) Signed: 02/11/2020 8:32:12 AM By: Linton Ham MD Entered By: Linton Ham on 02/07/2020 09:19:53 -------------------------------------------------------------------------------- Progress Note Details Patient Name: Date of  Service: [place] 02/07/2020 8:00 [place] Record Number: [id number] Patient Account Number: [id number] Date of Birth/Sex: Treating RN: 1941-02-21 (79 y.o. [name], [name] Primary Care Provider: [name]., [name] Other Clinician: Referring Provider: Treating Provider/Extender: [name], [name] in Treatment: 17 Subjective History of Present Illness (HPI) ADMISSION 10/11/2019 This is a 79 year old man with a history of a severe cardiomyopathy with an ejection fraction of about 20%, chronic renal failure stage III. He is listed as a type II diabetic in epic although the patient denies this. He also has a history of PVD. He states for the last month he has had wounds on his bilateral lower extremities that started off as blisters which denuded. He has areas on the left lateral calf and 2 on the right lateral. He has an area on the left first met head which he did not know was there he we identified this on intake. He has been using Silvadene cream provided by his primary care physician but he is complaining that this burns. 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 He was acutely ill at that time complicating GI bleed. ABIs in our clinic were 1.16 on the right and 1.13 on the left 2/25; the patient comes in with his areas on the left lateral and right lateral calf. There is also an area over the left first MTP bunion deformity. We have been using Sorbact. His edema control is fairly good 3/4; left lateral and right lateral calf. Most of his wounds are in the same position tightly adherent nonviable  debris. On the right we debrided the superior wound on the left both wounds. The area on his bunion over the left first MTP medially is I think just about closed. We have been using Sorbact without a lot of success changed to Iodoflex under compression 3/11; left lateral and right lateral calf perhaps minor improvement in the surface condition. We have been using Iodoflex. The area over the bunion of the left first MTP has closed over 3/18; left lateral and right lateral calf not much improvement. We have been using Iodoflex. Aggressive debridement last week. 3/25; left lateral and right lateral calf. We have been using Iodoflex. There is some improvement in the superior area on the right and 2 on the lateral left although there is still a lot of debris on the surface. The inferior area on the right is still a completely nonviable surface. 4/8; left lateral and right lateral calfs. Essentially mirror-image looking wounds 2 wounds on each side in close juxtaposition we have been using Iodoflex with some improvement in the very adherent fibrinous debris but not a lot. The patient has arterial studies next Wednesday morning and venous studies next Thursday morning. I have been avoiding any further aggressive debridement until we see the arterial study results. His ABIs were fairly good in this clinic and is dorsalis pedis pulses are palpable but the wound beds are pale. 4/15; left lateral and right lateral calfs.  Essentially mirror-image wounds with 2 wounds on each side in close juxtaposition but with rims of separating normal tissue. We have been using Iodoflex. The base of the wounds has been cleaning up quite nicely ARTERIAL STUDIES were done showing the patient had an ABI on the right of 1.27 with triphasic waveforms and a TBI of 0.90 on the left triphasic waveforms with an ABI of 1.28 and a TBI of 0.87. No evidence of arterial disease VENOUS REFLUX STUDIES; were done yesterday we do not have this  report yet. 4/23; VENOUS REFLUX STUDIES did not show any significant reflux right lower extremity. No evidence of a DVT He did have significant reflux in the common . femoral vein on the left but nothing else was listed as significant. He did not have a DVT. We have been using Iodoflex under compression his wounds are making progress. 4/29; improvements in the wound surface continue. We have been using Iodoflex. He has a 20% out-of-pocket co-pay for Apligraf which is unfortunate. We changed him to Sorbact today 5/6; started on Sorbact last week. Better looking wound surface but not much change in dimensions. 01/24/20-Patient is back at 3 weeks, wound surfaces are about the same but very minimal slough on the right leg wounds, however there is blistering on both legs adjacent to the wounds, patient denies any other symptoms or new symptoms. His studies have been reviewed and do not indicate any significant arterial or venous disease. But he is attending once in 3 weeks with home health changing in between 6/3; patient has 4 wounds 2 on each side of his lateral lower legs. These wounds are somewhat improved. He apparently arrived in clinic last week with a large blister on the right lateral lower leg that had denuded into the wound. They changed to silver alginate. Still under compression. He has straight Medicare and unfortunately has an unlimited co-pay for advanced treatment options. 6/10; his original 4 wounds are all better especially on the left lateral lower leg. 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 Objective Constitutional Sitting or standing  Blood Pressure is within target range for patient.. Pulse regular and within target range for patient.Marland Kitchen Respirations regular, non-labored and within target range.. Temperature is normal and within the target range for the patient.Marland Kitchen Appears in no distress. Vitals Time Taken: 7:51 AM, Height: 71 in, Weight: 198 lbs, BMI: 27.6, Pulse: 94 bpm, Respiratory Rate: 18 breaths/min, Blood Pressure: 124/77 mmHg. Cardiovascular Pedal pulses palpable and strong bilaterally.. Musculoskeletal Left second toe there is no tenderness the DIP seems intact. Ligamentous structures seemed intact. General Notes: Wound exam; ooNo debridement was required in any area. The areas on the left lateral calf x2 look really quite healthy to the ooThe areas on the right lateral calf x2 not quite as healthy as the left but really a lot better. No mechanical debridement was felt to be necessary ooThe superficial area on the right lateral calf that he came in last week is healthy looking and smaller ooHe has an abrasion at the base of the second left toenail from stubbing his toe. Integumentary (Hair, Skin) Wound #1 status is Open. Original  cause of wound was Blister. The wound is located on the Left,Lateral Lower Leg. The wound measures 2.3cm length x 1.4cm width x 0.3cm depth; 2.529cm^2 area and 0.759cm^3 volume. There is Fat Layer (Subcutaneous Tissue) Exposed exposed. There is no tunneling or undermining noted. There is a medium amount of serosanguineous drainage noted. The wound margin is distinct with the outline attached to the wound base. There is medium (34-66%) red granulation within the wound bed. There is a medium (34-66%) amount of necrotic tissue within the wound bed including Adherent Slough. Wound #3 status is Open. Original cause of wound was Blister. The wound is located on the Right,Proximal,Lateral Lower Leg. The wound measures 2.5cm length x 1.5cm width x 0.3cm depth; 2.945cm^2 area and 0.884cm^3 volume. There  is Fat Layer (Subcutaneous Tissue) Exposed exposed. There is no tunneling or undermining noted. There is a medium amount of serosanguineous drainage noted. The wound margin is well defined and not attached to the wound base. There is medium (34-66%) red granulation within the wound bed. There is a medium (34-66%) amount of necrotic tissue within the wound bed including Adherent Slough. Wound #4 status is Open. Original cause of wound was Blister. The wound is located on the Right,Distal,Lateral Lower Leg. The wound measures 2.5cm length x 1.7cm width x 0.3cm depth; 3.338cm^2 area and 1.001cm^3 volume. There is Fat Layer (Subcutaneous Tissue) Exposed exposed. There is a medium amount of serosanguineous drainage noted. The wound margin is distinct with the outline attached to the wound base. There is medium (34-66%) red granulation within the wound bed. There is a medium (34-66%) amount of necrotic tissue within the wound bed including Eschar and Adherent Slough. Wound #6 status is Open. Original cause of wound was Gradually Appeared. The wound is located on the Left,Proximal,Lateral Lower Leg. The wound measures 2.1cm length x 1cm width x 0.3cm depth; 1.649cm^2 area and 0.495cm^3 volume. There is Fat Layer (Subcutaneous Tissue) Exposed exposed. There is no tunneling or undermining noted. There is a medium amount of serosanguineous drainage noted. The wound margin is flat and intact. There is medium (34-66%) red granulation within the wound bed. There is a medium (34-66%) amount of necrotic tissue within the wound bed including Adherent Slough. Wound #7 status is Open. Original cause of wound was Blister. The wound is located on the Right,Lateral Lower Leg. The wound measures 3.5cm length x 0.9cm width x 0.1cm depth; 2.474cm^2 area and 0.247cm^3 volume. There is Fat Layer (Subcutaneous Tissue) Exposed exposed. There is no tunneling or undermining noted. There is a medium amount of serosanguineous  drainage noted. The wound margin is flat and intact. There is medium (34-66%) red granulation within the wound bed. There is a medium (34-66%) amount of necrotic tissue within the wound bed including Adherent Slough. Wound #8 status is Open. Original cause of wound was Trauma. The wound is located on the Left T Second. The wound measures 1.2cm length x 1cm width oe x 0.2cm depth; 0.942cm^2 area and 0.188cm^3 volume. There is Fat Layer (Subcutaneous Tissue) Exposed exposed. There is no tunneling or undermining noted. There is a small amount of serosanguineous drainage noted. The wound margin is flat and intact. There is large (67-100%) red granulation within the wound bed. There is no necrotic tissue within the wound bed. Assessment Active Problems ICD-10 Chronic venous hypertension (idiopathic) with inflammation of bilateral lower extremity Lymphedema, not elsewhere classified Non-pressure chronic ulcer of other part of left lower leg with fat layer exposed Non-pressure chronic ulcer of other part  of right lower leg with fat layer exposed Abrasion, left foot, subsequent encounter Procedures Wound #1 Pre-procedure diagnosis of Wound #1 is a Venous Leg Ulcer located on the Left,Lateral Lower Leg . There was a Four Layer Compression Therapy Procedure by [name], RN. Post procedure Diagnosis Wound #1: Same as Pre-Procedure Wound #3 Pre-procedure diagnosis of Wound #3 is a Venous Leg Ulcer located on the Right,Proximal,Lateral Lower Leg . There was a Four Layer Compression Therapy Procedure by [name], RN. Post procedure Diagnosis Wound #3: Same as Pre-Procedure Wound #4 Pre-procedure diagnosis of Wound #4 is a Venous Leg Ulcer located on the Right,Distal,Lateral Lower Leg . There was a Four Layer Compression Therapy Procedure by [name], RN. Post procedure Diagnosis Wound #4: Same as Pre-Procedure Wound #6 Pre-procedure diagnosis of Wound #6 is a Venous Leg Ulcer located on  the Left,Proximal,Lateral Lower Leg . There was a Four Layer Compression Therapy Procedure by [name], RN. Post procedure Diagnosis Wound #6: Same as Pre-Procedure Wound #7 Pre-procedure diagnosis of Wound #7 is a Venous Leg Ulcer located on the Right,Lateral Lower Leg . There was a Four Layer Compression Therapy Procedure by [name], RN. Post procedure Diagnosis Wound #7: Same as Pre-Procedure Plan Follow-up Appointments: Return Appointment in 2 weeks. Dressing Change Frequency: Other: - change twice a week by home health. Skin Barriers/Peri-Wound Care: Moisturizing lotion TCA Cream or Ointment - liberally in clinic today mixed with lotion. Wound Cleansing: May shower with protection. - use cast protectors on the days dressings are not changed. May shower and wash wound with soap and water. - with dressing changes only. Primary Wound Dressing: Wound #1 Left,Lateral Lower Leg: Silver Collagen - moisten with hydrogel. Wound #3 Right,Proximal,Lateral Lower Leg: Silver Collagen - moisten with hydrogel. Wound #4 Right,Distal,Lateral Lower Leg: Silver Collagen - moisten with hydrogel. Wound #6 Left,Proximal,Lateral Lower Leg: Silver Collagen - moisten with hydrogel. Wound #7 Right,Lateral Lower Leg: Calcium Alginate with Silver Wound #8 Left T Second: oe Other: - polysporin and a bandaid to 2nd toe change daily. Secondary Dressing: ABD pad Other: - pad left medial first met head for protection. Edema Control: 4 layer compression - Bilateral - ensure to wrap from foot to just below calf. Avoid standing for long periods of time Elevate legs to the level of the heart or above for 30 minutes daily and/or when sitting, a frequency of: - throughout the day. Home Health: [facility] for wound care. - [facility] 1. I am continuing with silver collagen to his original 4 wounds left lateral and right lateral 2. Silver alginate to the blistered  area on the right lateral calf from last week 3. Continue with bilateral compression 3. The left second toe nailbed had an abrasion. I wash this off with wound cleanser and gauze he will use topical antibiotics and Band-Aids or gauze Electronic Signature(s) Signed: 02/11/2020 8:32:12 AM By: Linton Ham MD Entered By: Linton Ham on 02/07/2020 09:23:33 -------------------------------------------------------------------------------- SuperBill Details Patient Name: Date of Service: [place], [place] 02/07/2020 Medical Record Number: [id number] Patient Account Number: [id number] Date of Birth/Sex: Treating RN: 02-24-1941 (79 y.o. [name], [name] Primary Care Provider: [name]., [name] Other Clinician: Referring Provider: Treating Provider/Extender: [name], [name] in Treatment: 17 Diagnosis Coding ICD-10 Codes Code Description [phone] Chronic venous hypertension (idiopathic) with inflammation of bilateral lower extremity I89.0 Lymphedema, not elsewhere classified L97.822 Non-pressure chronic ulcer of other part of left lower  leg with fat layer exposed L97.812 Non-pressure chronic ulcer of other part of right lower leg with fat layer exposed S90.812D Abrasion, left foot, subsequent encounter Facility Procedures The patient participates with Medicare or their insurance follows the Medicare Facility Guidelines: CPT4 Description Modifier Quantity Code [id number] BILATERAL: Application of multi-layer venous compression system; leg (below knee), including ankle and 1 foot. Physician Procedures : CPT4 Code Description Modifier [id number] - WC PHYS LEVEL 3 - EST PT ICD-10 Diagnosis Description L97.822 Non-pressure chronic ulcer of other part of left lower leg with fat layer exposed L97.812 Non-pressure chronic ulcer of other part of right  lower leg with fat layer exposed I87.323 Chronic venous hypertension (idiopathic) with inflammation of  bilateral lower extremity S90.812D Abrasion, left foot, subsequent encounter Quantity: 1 Electronic Signature(s) Signed: 02/11/2020 8:32:12 AM By: Linton Ham MD Entered By: Linton Ham on 02/07/2020 09:24:14

## 2020-02-11 NOTE — Progress Notes (Signed)
[name], [name] ([id number]) Visit Report for 02/07/2020 Arrival Information Details Patient Name: Date of Service: [place] 02/07/2020 8:00 [name] Patient Account Number: [id number] Date of Birth/Sex: Treating RN: 10-Sep-1940 (79 y.o. [name] Primary Care [name]: [name]., [name] Other Clinician: Referring Lumen Brinlee: Treating Azad Calame/Extender: [name], [name] in Treatment: 17 Visit Information History Since Last Visit Added or deleted any medications: No Patient Arrived: Cane Any new allergies or adverse reactions: No Arrival Time: 07:50 Had a fall or experienced change in No Accompanied By: alone activities of daily living that may affect Transfer Assistance: None risk of falls: Patient Identification Verified: Yes Signs or symptoms of abuse/neglect since last visito No Secondary Verification Process Completed: Yes Hospitalized since last visit: No Patient Requires Transmission-Based Precautions: No Implantable device outside of the clinic excluding No Patient Has Alerts: Yes cellular tissue based products placed in the center Patient Alerts: Patient on Blood Thinner since last visit: Has Dressing in Place as Prescribed: Yes Has Compression in Place as Prescribed: Yes Pain Present Now: No Electronic Signature(s) Signed: 02/07/2020 5:18:16 PM By: Levan Hurst RN, BSN Entered By: Levan Hurst on 02/07/2020 07:51:03 -------------------------------------------------------------------------------- Compression Therapy Details Patient Name: Date of Service: [place] 02/07/2020 8:00 [place] Record Number: [id number] Patient Account Number: [id number] Date of Birth/Sex: Treating RN: [birth year] (79 y.o. [name] Primary Care Delberta Folts: [name]., [name] Other Clinician: Referring Arbie Blankley: Treating Roshad Hack/Extender: [name], [name] in  Treatment: 17 Compression Therapy Performed for Wound Assessment: Wound #1 Left,Lateral Lower Leg Performed By: Clinician [name], RN Compression Type: Four Layer Post Procedure Diagnosis Same as Pre-procedure Electronic Signature(s) Signed: 02/07/2020 5:25:48 PM By: Deon Pilling Entered By: Deon Pilling on 02/07/2020 08:28:33 -------------------------------------------------------------------------------- Compression Therapy Details Patient Name: Date of Service: [place] [name] [place] 02/07/2020 8:00 [place] Record Number: [id number] Patient Account Number: [id number] Date of Birth/Sex: Treating RN: Jan 20, 1941 (79 y.o. [name] Primary Care David Rodriquez: [name]., [name] Other Clinician: Referring Esten Dollar: Treating Ival Pacer/Extender: [name], [name] in Treatment: 17 Compression Therapy Performed for Wound Assessment: Wound #3 Right,Proximal,Lateral Lower Leg Performed By: Clinician [name], RN Compression Type: Four Layer Post Procedure Diagnosis Same as Pre-procedure Electronic Signature(s) Signed: 02/07/2020 5:25:48 PM By: Deon Pilling Entered By: Deon Pilling on 02/07/2020 08:28:33 -------------------------------------------------------------------------------- Compression Therapy Details Patient Name: Date of Service: [place] [name] [place] 02/07/2020 8:00 [place] Record Number: [id number] Patient Account Number: [id number] Date of Birth/Sex: Treating RN: [birth year] (79 y.o. [name] Primary Care Aydian Dimmick: [name]., [name] Other Clinician: Referring Tricia Pledger: Treating Kamrin Spath/Extender: [name], [name] in Treatment: 17 Compression Therapy Performed for Wound Assessment: Wound #4 Right,Distal,Lateral Lower Leg Performed By: Clinician [name], RN Compression Type: Four Layer Post Procedure Diagnosis Same as Pre-procedure Electronic Signature(s) Signed: 02/07/2020 5:25:48  PM By: Deon Pilling Entered By: Deon Pilling on 02/07/2020 08:28:33 -------------------------------------------------------------------------------- Compression Therapy Details Patient Name: Date of Service: [place] [name] [place] 02/07/2020 8:00 [place] Record Number: [id number] Patient Account Number: [id number] Date of Birth/Sex: Treating RN: 1941/02/06 (79 y.o. [name], [name] Primary Care [name]: [name]., [name] Other Clinician: Referring Novalyn Lajara: Treating Selma Mink/Extender: [name], [name] in Treatment: 17 Compression Therapy Performed for Wound Assessment:  Wound #6 Left,Proximal,Lateral Lower Leg Performed By: Clinician [name], RN Compression Type: Four Layer Post Procedure Diagnosis Same as Pre-procedure Electronic Signature(s) Signed: 02/07/2020 5:25:48 PM By: Deon Pilling Entered By: Deon Pilling on 02/07/2020 08:28:33 -------------------------------------------------------------------------------- Compression Therapy Details Patient Name: Date of Service: [place] [name] [place] 02/07/2020 8:00 [place] Record Number: [id number] Patient Account Number: [id number] Date of Birth/Sex: Treating RN: [birth year] (79 y.o. [name] Primary Care Kham Zuckerman: [name]., [name] Other Clinician: Referring [name]: Treating [name]/Extender: [name], [name] in Treatment: 17 Compression Therapy Performed for Wound Assessment: Wound #7 Right,Lateral Lower Leg Performed By: Clinician [name], RN Compression Type: Four Layer Post Procedure Diagnosis Same as Pre-procedure Electronic Signature(s) Signed: 02/07/2020 5:25:48 PM By: Deon Pilling Entered By: Deon Pilling on 02/07/2020 08:28:33 -------------------------------------------------------------------------------- Encounter Discharge Information Details Patient Name: Date of Service: [place] [name] 02/07/2020 8:00 [place]  Record Number: [id number] Patient Account Number: [id number] Date of Birth/Sex: Treating RN: 12/20/1940 (79 y.o. [name] Primary Care Maresha Anastos: [name]., [name] Other Clinician: Referring Niki Cosman: Treating Tim Corriher/Extender: [name], [name] in Treatment: 17 Encounter Discharge Information Items Discharge Condition: Stable Ambulatory Status: Cane Discharge Destination: Home Transportation: Other Accompanied By: self Schedule Follow-up Appointment: Yes Clinical Summary of Care: Patient Declined Electronic Signature(s) Signed: 02/07/2020 4:59:25 PM By: Kela Millin Entered By: Kela Millin on 02/07/2020 08:52:01 -------------------------------------------------------------------------------- Lower Extremity Assessment Details Patient Name: Date of Service: [place] [name] 02/07/2020 8:00 [place] [facility] Record Number: [id number] Patient Account Number: [id number] Date of Birth/Sex: Treating RN: October 03, 1940 (79 y.o. [name] Primary Care Jeronimo Hellberg: [name]., [name] Other Clinician: Referring Sam Wunschel: Treating Veleta Yamamoto/Extender: [name], [name] in Treatment: 17 Edema Assessment Assessed: [Left: No] [Right: No] Edema: [Left: Yes] [Right: Yes] Calf Left: Right: Point of Measurement: 43 cm From Medial Instep 34 cm 34 cm Ankle Left: Right: Point of Measurement: 14 cm From Medial Instep 21.2 cm 22.4 cm Vascular Assessment Pulses: Dorsalis Pedis Palpable: [Left:Yes] [Right:Yes] Electronic Signature(s) Signed: 02/07/2020 5:18:16 PM By: Levan Hurst RN, BSN Entered By: Levan Hurst on 02/07/2020 08:00:23 -------------------------------------------------------------------------------- Multi Wound Chart Details Patient Name: Date of Service: [place] 02/07/2020 8:00 [place] Record Number: [id number] Patient Account Number: [id number] Date of Birth/Sex: Treating  RN: [birth year] (79 y.o. [name], [name] Primary Care Adriane Gabbert: [name]., [name] Other Clinician: Referring Ayala Ribble: Treating Jimmylee Ratterree/Extender: [name], [name] in Treatment: 17 Vital Signs Height(in): 71 Pulse(bpm): 56 Weight(lbs): 198 Blood Pressure(mmHg): 124/77 Body Mass Index(BMI): 28 Temperature(F): Respiratory Rate(breaths/min): 18 Photos: [1:No Photos Left, Lateral Lower Leg] [3:No Photos Right, Proximal, Lateral Lower Leg] [4:No Photos Right, Distal, Lateral Lower Leg] Wound Location: [1:Blister] [3:Blister] [4:Blister] Wounding Event: [1:Venous Leg Ulcer] [3:Venous Leg Ulcer] [4:Venous Leg Ulcer] Primary Etiology: [1:Anemia, Congestive Heart Failure,] [3:Anemia, Congestive Heart Failure,] [4:Anemia, Congestive Heart Failure,] Comorbid History: [1:Hypertension, Peripheral Venous Disease, 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] [3:Hypertension, Peripheral Venous Disease, 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] [4:Hypertension, Peripheral Venous Disease, 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] Date Acquired: [1:17] [3:17] [4:17] Weeks of Treatment: [1:Open] [3:Open] [4:Open] Wound Status: [1:Yes] [3:No] [4:No] Clustered Wound: [1:1] [3:N/A] [4:N/A] Clustered Quantity: [1:2.3x1.4x0.3] [3:2.5x1.5x0.3] [4:2.5x1.7x0.3] Measurements L x W x D (cm) [1:2.529] [3:2.945] [4:3.338] A (cm) : rea [1:0.759] [3:0.884] [4:1.001] Volume (cm) : [1:95.70%] [3:40.50%] [4:64.10%] % Reduction in  Area: [1:87.10%] [3:-78.60%] [4:-7.60%] % Reduction in Volume: [1:Full Thickness Without Exposed] [3:Full Thickness Without Exposed] [4:Full Thickness Without Exposed] Classification: [1:Support Structures Medium] [3:Support Structures Medium] [4:Support Structures Medium] Exudate A mount: [1:Serosanguineous] [3:Serosanguineous] [4:Serosanguineous] Exudate Type: [1:red, brown] [3:red, brown] [4:red, brown] Exudate Color: [1:Distinct, outline attached] [3:Well defined, not attached]  [4:Distinct, outline attached] Wound Margin: [1:Medium (34-66%)] [3:Medium (34-66%)] [4:Medium (34-66%)] Granulation Amount: [1:Red] [3:Red] [4:Red] Granulation Quality: [1:Medium (34-66%)] [3:Medium (34-66%)] [4:Medium (34-66%)] Necrotic Amount: [1:Adherent Slough] [3:Adherent Slough] [4:Eschar, Adherent Slough] Necrotic Tissue: [1:Fat Layer (Subcutaneous Tissue)] [3:Fat Layer (Subcutaneous Tissue)] [4:Fat Layer (Subcutaneous Tissue)] Exposed Structures: [1:Exposed: Yes Fascia: No Tendon: No Muscle: No Joint: No Bone: No Small (1-33%)] [3:Exposed: Yes Fascia: No Tendon: No Muscle: No Joint: No Bone: No Small (1-33%)] [4:Exposed: Yes Fascia: No Tendon: No Muscle: No Joint: No Bone: No Small (1-33%)] Epithelialization: [1:Compression Therapy] [3:Compression Therapy] [4:Compression [place] Wound Number: [id number]  Photos: No Photos No Photos No Photos Left, Proximal, Lateral Lower Leg Right, Lateral Lower Leg Left T Second oe Wound Location: Gradually Appeared Blister Trauma Wounding Event: Venous Leg Ulcer Venous Leg Ulcer Skin T ear Primary Etiology: Anemia, Congestive Heart Failure, Anemia, Congestive Heart Failure, Anemia, Congestive Heart Failure, Comorbid History: Hypertension, Peripheral Venous Hypertension, Peripheral Venous Hypertension, Peripheral Venous Disease, End Stage Renal Disease Disease, End Stage Renal Disease Disease, End Stage Renal Disease 12/13/2019 01/24/2020 02/06/2020 Date Acquired: 8 2 0 Weeks of Treatment: Open Open Open Wound Status: No No No Clustered Wound: N/A N/A N/A Clustered Quantity: 2.1x1x0.3 3.5x0.9x0.1 1.2x1x0.2 Measurements L x W x D (cm) 1.649 2.474 0.942 A (cm) : rea 0.495 0.247 0.188 Volume (cm) : 50.60% 90.20% N/A % Reduction in Area: 50.50% 90.20% N/A % Reduction in Volume: Full Thickness Without Exposed Full Thickness Without Exposed Full Thickness Without Exposed Classification: Support Structures Support Structures Support  Structures Medium Medium Small Exudate A mount: Serosanguineous Serosanguineous Serosanguineous Exudate Type: red, brown red, brown red, brown Exudate Color: Flat and Intact Flat and Intact Flat and Intact Wound Margin: Medium (34-66%) Medium (34-66%) Large (67-100%) Granulation Amount: Red Red Red Granulation Quality: Medium (34-66%) Medium (34-66%) None Present (0%) Necrotic Amount: Adherent Slough Adherent Slough N/A Necrotic Tissue: Fat Layer (Subcutaneous Tissue) Fat Layer (Subcutaneous Tissue) Fat Layer (Subcutaneous Tissue) Exposed Structures: Exposed: Yes Exposed: Yes Exposed: Yes Fascia: No Fascia: No Fascia: No Tendon: No Tendon: No Tendon: No Muscle: No Muscle: No Muscle: No Joint: No Joint: No Joint: No Bone: No Bone: No Bone: No Small (1-33%) Small (1-33%) None Epithelialization: Compression Therapy Compression Therapy N/A Procedures Performed: Treatment Notes Wound #1 (Left, Lateral Lower Leg) 1. Cleanse With Wound Cleanser Soap and water 2. Periwound Care Moisturizing lotion TCA Cream 3. Primary Dressing Applied Calcium Alginate Ag Collegen AG 4. Secondary Dressing ABD Pad 6. Support Layer Applied 4 layer compression wrap Notes silver alginate to right lateral lower leg and silver collagen to other sites. netting Wound #3 (Right, Proximal, Lateral Lower Leg) 1. Cleanse With Wound Cleanser Soap and water 2. Periwound Care Moisturizing lotion TCA Cream 3. Primary Dressing Applied Calcium Alginate Ag Collegen AG 4. Secondary Dressing ABD Pad 6. Support Layer Applied 4 layer compression wrap Notes silver alginate to right lateral lower leg and silver collagen to other sites. netting Wound #4 (Right, Distal, Lateral Lower Leg) 1. Cleanse With Wound Cleanser Soap and water 2. Periwound Care Moisturizing lotion TCA Cream 3. Primary Dressing Applied Calcium Alginate Ag Collegen AG 4. Secondary Dressing ABD Pad 6. Support  Layer Applied 4  layer compression wrap Notes silver alginate to right lateral lower leg and silver collagen to other sites. netting Wound #6 (Left, Proximal, Lateral Lower Leg) 1. Cleanse With Wound Cleanser Soap and water 2. Periwound Care Moisturizing lotion TCA Cream 3. Primary Dressing Applied Calcium Alginate Ag Collegen AG 4. Secondary Dressing ABD Pad 6. Support Layer Applied 4 layer compression wrap Notes silver alginate to right lateral lower leg and silver collagen to other sites. netting Wound #7 (Right, Lateral Lower Leg) 1. Cleanse With Wound Cleanser Soap and water 2. Periwound Care Moisturizing lotion TCA Cream 3. Primary Dressing Applied Calcium Alginate Ag Collegen AG 4. Secondary Dressing ABD Pad 6. Support Layer Applied 4 layer compression wrap Notes silver alginate to right lateral lower leg and silver collagen to other sites. netting Wound #8 (Left Toe Second) 1. Cleanse With Wound Cleanser Soap and water 2. Periwound Care Moisturizing lotion TCA Cream 3. Primary Dressing Applied Calcium Alginate Ag Collegen AG 4. Secondary Dressing ABD Pad 6. Support Layer Applied 4 layer compression wrap Notes silver alginate to right lateral lower leg and silver collagen to other sites. netting Electronic Signature(s) Signed: 02/07/2020 5:25:48 PM By: Deon Pilling Signed: 02/11/2020 8:32:12 AM By: Linton Ham MD Entered By: Linton Ham on 02/07/2020 09:20:02 -------------------------------------------------------------------------------- Multi-Disciplinary Care Plan Details Patient Name: Date of Service: [place] 02/07/2020 8:00 [place] Record Number: [id number] Patient Account Number: [id number] Date of Birth/Sex: Treating RN: April 08, 1941 (79 y.o. [name], [name] Primary Care Ranada Vigorito: [name]., [name] Other Clinician: Referring Senia Even: Treating Nekita Pita/Extender: [name], [name] in  Treatment: 33 Active Inactive Abuse / Safety / Falls / Self Care Management Nursing Diagnoses: Potential for falls Goals: Patient will remain injury free related to falls Date Initiated: 10/11/2019 Target Resolution Date: 02/29/2020 Goal Status: Active Patient/caregiver will verbalize understanding of the importance to maintain current immunizations/vaccinations Date Initiated: 10/11/2019 Date Inactivated: 11/08/2019 Target Resolution Date: 12/06/2019 Goal Status: Met Interventions: Assess self care needs on admission and as needed Provide education on fall prevention Treatment Activities: Patient referred to home care : 10/11/2019 Notes: Nutrition Nursing Diagnoses: Potential for alteratiion in Nutrition/Potential for imbalanced nutrition Goals: Patient/caregiver agrees to and verbalizes understanding of need to obtain nutritional consultation Date Initiated: 10/11/2019 Date Inactivated: 11/01/2019 Target Resolution Date: 11/09/2019 Goal Status: Met Patient/caregiver agrees to and verbalizes understanding of need to use nutritional supplements and/or vitamins as prescribed Date Initiated: 10/11/2019 Target Resolution Date: 02/29/2020 Goal Status: Active Interventions: Assess HgA1c results as ordered upon admission and as needed Provide education on nutrition Treatment Activities: Education provided on Nutrition : 01/31/2020 Obtain HgA1c : 10/11/2019 Patient referred to Primary Care Physician for further nutritional evaluation : 10/11/2019 Notes: Electronic Signature(s) Signed: 02/07/2020 5:25:48 PM By: Deon Pilling Entered By: Deon Pilling on 02/07/2020 07:58:21 -------------------------------------------------------------------------------- Pain Assessment Details Patient Name: Date of Service: [place] [name] [place] 02/07/2020 8:00 [place] Record Number: [id number] Patient Account Number: [id number] Date of Birth/Sex: Treating RN: 02-01-1941 (79 y.o. [name] Primary  Care Jquan Egelston: [name]., [name] Other Clinician: Referring Kendell Sagraves: Treating Pelham Hennick/Extender: [name], [name] in Treatment: 17 Active Problems Location of Pain Severity and Description of Pain Patient Has Paino No Site Locations Pain Management and Medication Current Pain Management: Medication: No Cold Application: No Rest: No Massage: No Activity: No T.E.N.S.: No Heat Application: No Leg drop or elevation: No Is the Current Pain Management Adequate: Inadequate Electronic  Signature(s) Signed: 02/07/2020 5:18:16 PM By: Levan Hurst RN, BSN Entered By: Levan Hurst on 02/07/2020 07:59:42 -------------------------------------------------------------------------------- Patient/Caregiver Education Details Patient Name: Date of Service: [place] 6/10/2021andnbsp8:00 [place] Record Number: [id number] Patient Account Number: [id number] Date of Birth/Gender: Treating RN: 09-09-1940 (79 y.o. [name] Primary Care Physician: [name]., [name] Other Clinician: Referring Physician: Treating Physician/Extender: [name], [name] in Treatment: 17 Education Assessment Education Provided To: Patient Education Topics Provided Wound/Skin Impairment: Handouts: Skin Care Do's and Dont's Methods: Explain/Verbal Responses: Reinforcements needed Electronic Signature(s) Signed: 02/07/2020 5:25:48 PM By: Deon Pilling Entered By: Deon Pilling on 02/07/2020 07:58:33 -------------------------------------------------------------------------------- Wound Assessment Details Patient Name: Date of Service: [place] [name] [place] 02/07/2020 8:00 [place] Record Number: [id number] Patient Account Number: [id number] Date of Birth/Sex: Treating RN: 1941-03-11 (79 y.o. [name] Primary Care Tiran Sauseda: [name]., [name] Other Clinician: Referring Janneth Krasner: Treating Tzirel Leonor/Extender: [name], [name] in Treatment: 17 Wound Status Wound Number: 1 Primary Venous Leg Ulcer Etiology: Wound Location: Left, Lateral Lower Leg Wound Open Wounding Event: Blister Status: Date Acquired: 09/11/2019 Comorbid Anemia, Congestive Heart Failure, Hypertension, Peripheral Weeks Of Treatment: 17 History: Venous Disease, End Stage Renal Disease Clustered Wound: Yes Wound Measurements Length: (cm) 2.3 Width: (cm) 1.4 Depth: (cm) 0.3 Clustered Quantity: 1 Area: (cm) 2.529 Volume: (cm) 0.759 % Reduction in Area: 95.7% % Reduction in Volume: 87.1% Epithelialization: Small (1-33%) Tunneling: No Undermining: No Wound Description Classification: Full Thickness Without Exposed Support Stru Wound Margin: Distinct, outline attached Exudate Amount: Medium Exudate Type: Serosanguineous Exudate Color: red, brown ctures Foul Odor After Cleansing: No Slough/Fibrino Yes Wound Bed Granulation Amount: Medium (34-66%) Exposed Structure Granulation Quality: Red Fascia Exposed: No Necrotic Amount: Medium (34-66%) Fat Layer (Subcutaneous Tissue) Exposed: Yes Necrotic Quality: Adherent Slough Tendon Exposed: No Muscle Exposed: No Joint Exposed: No Bone Exposed: No Treatment Notes Wound #1 (Left, Lateral Lower Leg) 1. Cleanse With Wound Cleanser Soap and water 2. Periwound Care Moisturizing lotion TCA Cream 3. Primary Dressing Applied Calcium Alginate Ag Collegen AG 4. Secondary Dressing ABD Pad 6. Support Layer Applied 4 layer compression wrap Notes silver alginate to right lateral lower leg and silver collagen to other sites. netting Electronic Signature(s) Signed: 02/07/2020 5:18:16 PM By: Levan Hurst RN, BSN Entered By: Levan Hurst on 02/07/2020 08:01:52 -------------------------------------------------------------------------------- Wound Assessment Details Patient Name: Date of Service: [place] [facility] [place] 02/07/2020 8:00 [place] Record  Number: [id number] Patient Account Number: [id number] Date of Birth/Sex: Treating RN: 1940/12/03 (79 y.o. [name] Primary Care Victory Strollo: [name]., [name] Other Clinician: Referring Octavius Shin: Treating Houa Ackert/Extender: [name], [name] in Treatment: 17 Wound Status Wound Number: 3 Primary Venous Leg Ulcer Etiology: Wound Location: Right, Proximal, Lateral Lower Leg Wound Open Wounding Event: Blister Status: Date Acquired: 09/11/2019 Comorbid Anemia, Congestive Heart Failure, Hypertension, Peripheral Weeks Of Treatment: 17 History: Venous Disease, End Stage Renal Disease Clustered Wound: No Wound Measurements Length: (cm) 2.5 Width: (cm) 1.5 Depth: (cm) 0.3 Area: (cm) 2.945 Volume: (cm) 0.884 % Reduction in Area: 40.5% % Reduction in Volume: -78.6% Epithelialization: Small (1-33%) Tunneling: No Undermining: No Wound Description Classification: Full Thickness Without Exposed Support Structures Wound Margin: Well defined, not attached Exudate Amount: Medium Exudate Type: Serosanguineous Exudate Color: red, brown Foul Odor After Cleansing: No Slough/Fibrino Yes Wound Bed Granulation Amount: Medium (34-66%) Exposed Structure Granulation  Quality: Red Fascia Exposed: No Necrotic Amount: Medium (34-66%) Fat Layer (Subcutaneous Tissue) Exposed: Yes Necrotic Quality: Adherent Slough Tendon Exposed: No Muscle Exposed: No Joint Exposed: No Bone Exposed: No Treatment Notes Wound #3 (Right, Proximal, Lateral Lower Leg) 1. Cleanse With Wound Cleanser Soap and water 2. Periwound Care Moisturizing lotion TCA Cream 3. Primary Dressing Applied Calcium Alginate Ag Collegen AG 4. Secondary Dressing ABD Pad 6. Support Layer Applied 4 layer compression wrap Notes silver alginate to right lateral lower leg and silver collagen to other sites. netting Electronic Signature(s) Signed: 02/07/2020 5:18:16 PM By: Levan Hurst RN,  BSN Entered By: Levan Hurst on 02/07/2020 08:02:01 -------------------------------------------------------------------------------- Wound Assessment Details Patient Name: Date of Service: [place] 02/07/2020 8:00 [place] Record Number: [id number] Patient Account Number: [id number] Date of Birth/Sex: Treating RN: 1941/03/15 (79 y.o. [name] Primary Care Lynnette Pote: [name]., [name] Other Clinician: Referring Upton Russey: Treating Jaquila Santelli/Extender: [name], [name] in Treatment: 17 Wound Status Wound Number: 4 Primary Venous Leg Ulcer Etiology: Wound Location: Right, Distal, Lateral Lower Leg Wound Open Wounding Event: Blister Status: Date Acquired: 09/11/2019 Comorbid Anemia, Congestive Heart Failure, Hypertension, Peripheral Weeks Of Treatment: 17 History: Venous Disease, End Stage Renal Disease Clustered Wound: No Wound Measurements Length: (cm) 2.5 Width: (cm) 1.7 Depth: (cm) 0.3 Area: (cm) 3.338 Volume: (cm) 1.001 % Reduction in Area: 64.1% % Reduction in Volume: -7.6% Epithelialization: Small (1-33%) Wound Description Classification: Full Thickness Without Exposed Support Structures Wound Margin: Distinct, outline attached Exudate Amount: Medium Exudate Type: Serosanguineous Exudate Color: red, brown Foul Odor After Cleansing: No Slough/Fibrino Yes Wound Bed Granulation Amount: Medium (34-66%) Exposed Structure Granulation Quality: Red Fascia Exposed: No Necrotic Amount: Medium (34-66%) Fat Layer (Subcutaneous Tissue) Exposed: Yes Necrotic Quality: Eschar, Adherent Slough Tendon Exposed: No Muscle Exposed: No Joint Exposed: No Bone Exposed: No Treatment Notes Wound #4 (Right, Distal, Lateral Lower Leg) 1. Cleanse With Wound Cleanser Soap and water 2. Periwound Care Moisturizing lotion TCA Cream 3. Primary Dressing Applied Calcium Alginate Ag Collegen AG 4. Secondary Dressing ABD Pad 6. Support  Layer Applied 4 layer compression wrap Notes silver alginate to right lateral lower leg and silver collagen to other sites. netting Electronic Signature(s) Signed: 02/07/2020 5:18:16 PM By: Levan Hurst RN, BSN Entered By: Levan Hurst on 02/07/2020 08:02:14 -------------------------------------------------------------------------------- Wound Assessment Details Patient Name: Date of Service: [place] 02/07/2020 8:00 [place] Record Number: [id number] Patient Account Number: [id number] Date of Birth/Sex: Treating RN: 10/30/1940 (79 y.o. [name] Primary Care Raylynne Cubbage: [name]., [name] Other Clinician: Referring Carson Bogden: Treating Karsen Fellows/Extender: [name], [name] in Treatment: 17 Wound Status Wound Number: 6 Primary Venous Leg Ulcer Etiology: Wound Location: Left, Proximal, Lateral Lower Leg Wound Open Wounding Event: Gradually Appeared Status: Date Acquired: 12/13/2019 Comorbid Anemia, Congestive Heart Failure, Hypertension, Peripheral Weeks Of Treatment: 8 History: Venous Disease, End Stage Renal Disease Clustered Wound: No Wound Measurements Length: (cm) 2.1 Width: (cm) 1 Depth: (cm) 0.3 Area: (cm) 1.649 Volume: (cm) 0.495 % Reduction in Area: 50.6% % Reduction in Volume: 50.5% Epithelialization: Small (1-33%) Tunneling: No Undermining: No Wound Description Classification: Full Thickness Without Exposed Support Structures Wound Margin: Flat and Intact Exudate Amount: Medium Exudate Type: Serosanguineous Exudate Color: red, brown Foul Odor After Cleansing: No Slough/Fibrino Yes Wound Bed Granulation Amount: Medium (34-66%) Exposed Structure Granulation Quality: Red Fascia Exposed: No Necrotic Amount: Medium (34-66%) Fat Layer (Subcutaneous  Tissue) Exposed: Yes Necrotic Quality: Adherent Slough Tendon Exposed: No Muscle Exposed: No Joint Exposed: No Bone Exposed: No Treatment Notes Wound #6 (Left,  Proximal, Lateral Lower Leg) 1. Cleanse With Wound Cleanser Soap and water 2. Periwound Care Moisturizing lotion TCA Cream 3. Primary Dressing Applied Calcium Alginate Ag Collegen AG 4. Secondary Dressing ABD Pad 6. Support Layer Applied 4 layer compression wrap Notes silver alginate to right lateral lower leg and silver collagen to other sites. netting Electronic Signature(s) Signed: 02/07/2020 5:18:16 PM By: Levan Hurst RN, BSN Entered By: Levan Hurst on 02/07/2020 08:02:25 -------------------------------------------------------------------------------- Wound Assessment Details Patient Name: Date of Service: [place] 02/07/2020 8:00 [place] Record Number: [id number] Patient Account Number: [id number] Date of Birth/Sex: Treating RN: Dec 21, 1940 (79 y.o. [name] Primary Care Assunta Pupo: [name]., [name] Other Clinician: Referring Starr Urias: Treating Venita Seng/Extender: [name], [name] in Treatment: 17 Wound Status Wound Number: 7 Primary Venous Leg Ulcer Etiology: Wound Location: Right, Lateral Lower Leg Wound Open Wounding Event: Blister Status: Date Acquired: 01/24/2020 Comorbid Anemia, Congestive Heart Failure, Hypertension, Peripheral Weeks Of Treatment: 2 History: Venous Disease, End Stage Renal Disease Clustered Wound: No Wound Measurements Length: (cm) 3.5 Width: (cm) 0.9 Depth: (cm) 0.1 Area: (cm) 2.474 Volume: (cm) 0.247 % Reduction in Area: 90.2% % Reduction in Volume: 90.2% Epithelialization: Small (1-33%) Tunneling: No Undermining: No Wound Description Classification: Full Thickness Without Exposed Support Structures Wound Margin: Flat and Intact Exudate Amount: Medium Exudate Type: Serosanguineous Exudate Color: red, brown Foul Odor After Cleansing: No Slough/Fibrino No Wound Bed Granulation Amount: Medium (34-66%) Exposed Structure Granulation Quality: Red Fascia Exposed:  No Necrotic Amount: Medium (34-66%) Fat Layer (Subcutaneous Tissue) Exposed: Yes Necrotic Quality: Adherent Slough Tendon Exposed: No Muscle Exposed: No Joint Exposed: No Bone Exposed: No Treatment Notes Wound #7 (Right, Lateral Lower Leg) 1. Cleanse With Wound Cleanser Soap and water 2. Periwound Care Moisturizing lotion TCA Cream 3. Primary Dressing Applied Calcium Alginate Ag Collegen AG 4. Secondary Dressing ABD Pad 6. Support Layer Applied 4 layer compression wrap Notes silver alginate to right lateral lower leg and silver collagen to other sites. netting Electronic Signature(s) Signed: 02/07/2020 5:18:16 PM By: Levan Hurst RN, BSN Entered By: Levan Hurst on 02/07/2020 08:02:40 -------------------------------------------------------------------------------- Wound Assessment Details Patient Name: Date of Service: [place] 02/07/2020 8:00 [place] Record Number: [id number] Patient Account Number: [id number] Date of Birth/Sex: Treating RN: Sep 27, 1940 (79 y.o. [name] Primary Care Laverna Dossett: [name]., [name] Other Clinician: Referring Parish Dubose: Treating Laiklyn [place]/Extender: [name], [name] in Treatment: 17 Wound Status Wound Number: 8 Primary Skin Tear Etiology: Wound Location: Left T Second oe Wound Open Wounding Event: Trauma Status: Date Acquired: 02/06/2020 Comorbid Anemia, Congestive Heart Failure, Hypertension, Peripheral Weeks Of Treatment: 0 History: Venous Disease, End Stage Renal Disease Clustered Wound: No Wound Measurements Length: (cm) 1.2 Width: (cm) 1 Depth: (cm) 0.2 Area: (cm) 0.942 Volume: (cm) 0.188 % Reduction in Area: % Reduction in Volume: Epithelialization: None Tunneling: No Undermining: No Wound Description Classification: Full Thickness Without Exposed Support Structures Wound Margin: Flat and Intact Exudate Amount: Small Exudate Type: Serosanguineous Exudate Color:  red, brown Foul Odor After Cleansing: No Slough/Fibrino No Wound Bed Granulation Amount: Large (67-100%) Exposed Structure Granulation Quality: Red Fascia Exposed: No Necrotic Amount: None Present (0%) Fat Layer (Subcutaneous Tissue) Exposed: Yes Tendon Exposed: No Muscle Exposed: No Joint Exposed: No Bone Exposed: No  Treatment Notes Wound #8 (Left Toe Second) 1. Cleanse With Wound Cleanser Soap and water 2. Periwound Care Moisturizing lotion TCA Cream 3. Primary Dressing Applied Calcium Alginate Ag Collegen AG 4. Secondary Dressing ABD Pad 6. Support Layer Applied 4 layer compression wrap Notes silver alginate to right lateral lower leg and silver collagen to other sites. netting Electronic Signature(s) Signed: 02/07/2020 5:18:16 PM By: Levan Hurst RN, BSN Entered By: Levan Hurst on 02/07/2020 08:03:57 -------------------------------------------------------------------------------- [place] Details Patient Name: Date of Service: [place] 02/07/2020 8:00 [place] Record Number: [id number] Patient Account Number: [id number] Date of Birth/Sex: Treating RN: [birth year] (79 y.o. [name] Primary Care Izella Ybanez: [name]., [name] Other Clinician: Referring Shavette Shoaff: Treating Ninette Cotta/Extender: [name], [name] in Treatment: 17 Vital Signs Time Taken: 07:51 Pulse (bpm): 94 Height (in): 71 Respiratory Rate (breaths/min): 18 Weight (lbs): 198 Blood Pressure (mmHg): 124/77 Body Mass Index (BMI): 27.6 Reference Range: 80 - 120 mg / dl Electronic Signature(s) Signed: 02/07/2020 5:18:16 PM By: Levan Hurst RN, BSN Entered By: Levan Hurst on 02/07/2020 07:51:24

## 2020-02-21 ENCOUNTER — Other Ambulatory Visit: Payer: Self-pay

## 2020-02-21 ENCOUNTER — Encounter (HOSPITAL_BASED_OUTPATIENT_CLINIC_OR_DEPARTMENT_OTHER): Payer: Medicare Other | Admitting: Internal Medicine

## 2020-02-21 DIAGNOSIS — E11621 Type 2 diabetes mellitus with foot ulcer: Secondary | ICD-10-CM | POA: Diagnosis not present

## 2020-02-22 NOTE — Progress Notes (Signed)
MARTRELL, EGUIA ([id number]) Visit Report for 02/21/2020 HPI Details Patient Name: Date of Service: [place] 02/21/2020 12:30 PM Medical Record Number: [id number] Patient Account Number: [id number] Date of Birth/Sex: Treating RN: 05-21-1941 (79 y.o. [name], [name] Primary Care Provider: [name]., [name] Other Clinician: Referring Provider: Treating Provider/Extender: [name], [name] in Treatment: 19 History of Present Illness HPI Description: ADMISSION 10/11/2019 This is a 79 year old man with a history of a severe cardiomyopathy with an ejection fraction of about 20%, chronic renal failure stage III. He is listed as a type II diabetic in epic although the patient denies this. He also has a history of PVD. He states for the last month he has had wounds on his bilateral lower extremities that started off as blisters which denuded. He has areas on the left lateral calf and 2 on the right lateral. He has an area on the left first met head which he did not know was there he we identified this on intake. He has been using Silvadene cream provided by his primary care physician but he is complaining that this burns. 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 He was acutely ill at that time complicating GI bleed. ABIs in our clinic were 1.16 on the right and 1.13 on the left 2/25; the patient comes in with his areas on the left lateral and right lateral calf. There is also an area over the left first MTP bunion deformity. We have been using Sorbact. His edema control is fairly good 3/4; left lateral and  right lateral calf. Most of his wounds are in the same position tightly adherent nonviable debris. On the right we debrided the superior wound on the left both wounds. The area on his bunion over the left first MTP medially is I think just about closed. We have been using Sorbact without a lot of success changed to Iodoflex under compression 3/11; left lateral and right lateral calf perhaps minor improvement in the surface condition. We have been using Iodoflex. The area over the bunion of the left first MTP has closed over 3/18; left lateral and right lateral calf not much improvement. We have been using Iodoflex. Aggressive debridement last week. 3/25; left lateral and right lateral calf. We have been using Iodoflex. There is some improvement in the superior area on the right and 2 on the lateral left although there is still a lot of debris on the surface. The inferior area on the right is still a completely nonviable surface. 4/8; left lateral and right lateral calfs. Essentially mirror-image looking wounds 2 wounds on each side in close juxtaposition we have been using Iodoflex with some improvement in the very adherent fibrinous debris but not a lot. The patient has arterial studies next Wednesday morning and venous studies next Thursday morning. I have been avoiding any further aggressive debridement until we see the arterial study results. His ABIs were fairly good in this clinic and is dorsalis pedis pulses are palpable but the wound beds are pale. 4/15; left lateral and right lateral calfs. Essentially mirror-image wounds with 2 wounds on each side in close juxtaposition but  with rims of separating normal tissue. We have been using Iodoflex. The base of the wounds has been cleaning up quite nicely ARTERIAL STUDIES were done showing the patient had an ABI on the right of 1.27 with triphasic waveforms and a TBI of 0.90 on the left triphasic waveforms with an ABI of 1.28 and a TBI of 0.87. No  evidence of arterial disease VENOUS REFLUX STUDIES; were done yesterday we do not have this report yet. 4/23; VENOUS REFLUX STUDIES did not show any significant reflux right lower extremity. No evidence of a DVT He did have significant reflux in the common . femoral vein on the left but nothing else was listed as significant. He did not have a DVT. We have been using Iodoflex under compression his wounds are making progress. 4/29; improvements in the wound surface continue. We have been using Iodoflex. He has a 20% out-of-pocket co-pay for Apligraf which is unfortunate. We changed him to Sorbact today 5/6; started on Sorbact last week. Better looking wound surface but not much change in dimensions. 01/24/20-Patient is back at 3 weeks, wound surfaces are about the same but very minimal slough on the right leg wounds, however there is blistering on both legs adjacent to the wounds, patient denies any other symptoms or new symptoms. His studies have been reviewed and do not indicate any significant arterial or venous disease. But he is attending once in 3 weeks with home health changing in between 6/3; patient has 4 wounds 2 on each side of his lateral lower legs. These wounds are somewhat improved. He apparently arrived in clinic last week with a large blister on the right lateral lower leg that had denuded into the wound. They changed to silver alginate. Still under compression. He has straight Medicare and unfortunately has an unlimited co-pay for advanced treatment options. 6/10; his original 4 wounds are all better especially on the left lateral lower leg. 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 6/24; blister on the right lateral lower leg has healed. There is no additional wounds. The traumatic left second toe is also closed. The 4 that he has are all better Electronic Signature(s) Signed: 02/22/2020 5:27:17 PM By: Linton Ham MD Entered By: Linton Ham on 02/21/2020 13:12:32 -------------------------------------------------------------------------------- Physical Exam Details Patient Name: Date of Service: [place] 02/21/2020 12:30 PM Medical Record Number: [id number] Patient Account Number: [id number] Date of Birth/Sex: Treating RN: 1941/01/16 (79 y.o. [name], [name] Primary Care Provider: [name]., [name] Other Clinician: Referring Provider: Treating Provider/Extender: [name], [name] in Treatment: 19 Constitutional Sitting or standing Blood Pressure is within target range for patient.. Pulse regular and within target range for patient.Marland Kitchen Respirations regular, non-labored and within target range.. Temperature is normal and within the target range for the patient.Marland Kitchen Appears in no distress. Respiratory work of breathing is normal. Cardiovascular Pedal pulses are palpable. Edema control is excellent. Notes Wound exam No debridement is required. The area on the left lateral x2 looks healthy as does the x2 on the right. There are no additional wounds Electronic Signature(s) Signed: 02/22/2020 5:27:17  PM By: Linton Ham MD Entered By: Linton Ham on 02/21/2020 13:13:38 -------------------------------------------------------------------------------- Physician Orders Details Patient Name: Date of Service: [place] 02/21/2020 12:30 PM Medical Record Number: [id number] Patient Account Number: [id number] Date of Birth/Sex: Treating RN: 1941-07-26 (79 y.o. 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 Dressing Change Frequency Other: - change twice a week by home health. Skin Barriers/Peri-Wound Care Moisturizing lotion TCA Cream or Ointment - liberally in clinic today mixed with lotion. Wound Cleansing May shower with protection. - use cast protectors on the days dressings are not changed. May shower and wash wound with soap and water. - with dressing changes only. Primary Wound Dressing Wound #1 Left,Lateral Lower Leg Silver Collagen - moisten with hydrogel. Wound #3 Right,Proximal,Lateral Lower Leg Silver Collagen - moisten with hydrogel. Wound #4 Right,Distal,Lateral Lower Leg Silver Collagen - moisten with hydrogel. Wound #6 Left,Proximal,Lateral Lower Leg Silver Collagen - moisten with hydrogel. Secondary Dressing ABD pad Other: - pad left medial first met head for protection. Edema Control 4 layer compression - Bilateral - ensure to wrap from foot to just below calf. Avoid standing for long periods of time Elevate legs to the level of the heart or above for 30 minutes daily and/or when sitting, a frequency of: - throughout the day. [place] skilled nursing for wound care. [facility] Electronic Signature(s) Signed: 02/21/2020 5:34:37 PM By: Deon Pilling Signed: 02/22/2020 5:27:17 PM By: Linton Ham MD Entered By: Deon Pilling on 02/21/2020  13:07:45 -------------------------------------------------------------------------------- Problem List Details Patient Name: Date of Service: [place] 02/21/2020 12:30 PM Medical Record Number: [id number] Patient Account Number: [id number] Date of Birth/Sex: Treating RN: 1940/09/13 (79 y.o. 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/06/2020 No Yes L97.812 Non-pressure chronic ulcer of other part of right lower leg with fat layer 01/03/2020 No Yes exposed S90.812D Abrasion, left foot, subsequent encounter 02/07/2020 No Yes Inactive Problems ICD-10 Code Description Active Date Inactive Date L97.521 Non-pressure chronic ulcer of other part of left foot limited to breakdown of skin 10/11/2019 10/11/2019 Resolved Problems ICD-10 Code Description Active Date Resolved Date 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 10/11/2019 10/11/2019 Electronic Signature(s) Signed: 02/22/2020 5:27:17 PM By: Linton Ham MD Entered By: Linton Ham on 02/21/2020 13:08:30 -------------------------------------------------------------------------------- Progress Note Details Patient Name: Date of Service: [place] 02/21/2020 12:30 PM Medical Record Number: [id number] Patient Account Number: [id number] Date of Birth/Sex: Treating RN: Feb 25, 1941 (79 y.o. [name] Provider: [name]., RO  [name] Other Clinician: Referring Provider: Treating  Provider/Extender: [name], [name] in Treatment: 19 Subjective History of Present Illness (HPI) ADMISSION 10/11/2019 This is a 79 year old man with a history of a severe cardiomyopathy with an ejection fraction of about 20%, chronic renal failure stage III. He is listed as a type II diabetic in epic although the patient denies this. He also has a history of PVD. He states for the last month he has had wounds on his bilateral lower extremities that started off as blisters which denuded. He has areas on the left lateral calf and 2 on the right lateral. He has an area on the left first met head which he did not know was there he we identified this on intake. He has been using Silvadene cream provided by his primary care physician but he is complaining that this burns. 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 He was acutely ill at that time complicating GI bleed. ABIs in our clinic were 1.16 on the right and 1.13 on the left 2/25; the patient comes in with his areas on the left lateral and right lateral calf. There is also an area over the left first MTP bunion deformity. We have been using Sorbact. His edema control is fairly good 3/4; left lateral and right lateral calf. Most of his wounds are in the same position tightly adherent nonviable debris. On the right we debrided the superior wound on the left both wounds. The area on his bunion over the left first MTP medially is I think just about closed. We have been using Sorbact without a lot of success changed to Iodoflex under compression 3/11; left lateral and right  lateral calf perhaps minor improvement in the surface condition. We have been using Iodoflex. The area over the bunion of the left first MTP has closed over 3/18; left lateral and right lateral calf not much improvement. We have been using Iodoflex. Aggressive debridement last week. 3/25; left lateral and right lateral calf. We have been using Iodoflex. There is some improvement in the superior area on the right and 2 on the lateral left although there is still a lot of debris on the surface. The inferior area on the right is still a completely nonviable surface. 4/8; left lateral and right lateral calfs. Essentially mirror-image looking wounds 2 wounds on each side in close juxtaposition we have been using Iodoflex with some improvement in the very adherent fibrinous debris but not a lot. The patient has arterial studies next Wednesday morning and venous studies next Thursday morning. I have been avoiding any further aggressive debridement until we see the arterial study results. His ABIs were fairly good in this clinic and is dorsalis pedis pulses are palpable but the wound beds are pale. 4/15; left lateral and right lateral calfs. Essentially mirror-image wounds with 2 wounds on each side in close juxtaposition but with rims of separating normal tissue. We have been using Iodoflex. The base of the wounds has been cleaning up quite nicely ARTERIAL STUDIES were done showing the patient had an ABI on the right of 1.27 with triphasic waveforms and a TBI of 0.90 on the left  triphasic waveforms with an ABI of 1.28 and a TBI of 0.87. No evidence of arterial disease VENOUS REFLUX STUDIES; were done yesterday we do not have this report yet. 4/23; VENOUS REFLUX STUDIES did not show any significant reflux right lower extremity. No evidence of a DVT He did have significant reflux in the common . femoral vein on the left but nothing else was listed as significant. He did not have a DVT. We have been using  Iodoflex under compression his wounds are making progress. 4/29; improvements in the wound surface continue. We have been using Iodoflex. He has a 20% out-of-pocket co-pay for Apligraf which is unfortunate. We changed him to Sorbact today 5/6; started on Sorbact last week. Better looking wound surface but not much change in dimensions. 01/24/20-Patient is back at 3 weeks, wound surfaces are about the same but very minimal slough on the right leg wounds, however there is blistering on both legs adjacent to the wounds, patient denies any other symptoms or new symptoms. His studies have been reviewed and do not indicate any significant arterial or venous disease. But he is attending once in 3 weeks with home health changing in between 6/3; patient has 4 wounds 2 on each side of his lateral lower legs. These wounds are somewhat improved. He apparently arrived in clinic last week with a large blister on the right lateral lower leg that had denuded into the wound. They changed to silver alginate. Still under compression. He has straight Medicare and unfortunately has an unlimited co-pay for advanced treatment options. 6/10; his original 4 wounds are all better especially on the left lateral lower leg. 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 6/24; blister on the right lateral lower leg has healed. There is no additional wounds. The traumatic left second toe is also closed. The 4 that he has are all better Objective Constitutional Sitting or standing Blood Pressure is within target range for patient.. Pulse regular and within target range for patient.Marland Kitchen Respirations  regular, non-labored and within target range.. Temperature is normal and within the target range for the patient.Marland Kitchen Appears in no distress. Vitals Time Taken: 12:45 PM, Height: 71 in, Weight: 198 lbs, BMI: 27.6, Temperature: 97.6 F, Pulse: 97 bpm, Respiratory Rate: 18 breaths/min, Blood Pressure: 129/77 mmHg. Respiratory work of breathing is normal. Cardiovascular Pedal pulses are palpable. Edema control is excellent. General Notes: Wound exam ooNo debridement is required. The area on the left lateral x2 looks healthy as does the x2 on the right. There are no additional wounds Integumentary (Hair, Skin) Wound #1 status is Open. Original cause of wound was Blister. The wound is located on the Left,Lateral Lower Leg. The wound measures 2cm length x 1.3cm width x 0.2cm depth; 2.042cm^2 area and 0.408cm^3 volume. There is Fat Layer (Subcutaneous Tissue) Exposed exposed. There is no tunneling or undermining noted. There is a medium amount of serosanguineous drainage noted. The wound margin is distinct with the outline attached to the wound base. There is medium (34-66%) red granulation within the wound bed. There is a medium (34-66%) amount of necrotic tissue within the wound bed including Adherent Slough. Wound #  3 status is Open. Original cause of wound was Blister. The wound is located on the Right,Proximal,Lateral Lower Leg. The wound measures 2.5cm length x 1.5cm width x 0.3cm depth; 2.945cm^2 area and 0.884cm^3 volume. There is Fat Layer (Subcutaneous Tissue) Exposed exposed. There is no tunneling or undermining noted. There is a medium amount of serosanguineous drainage noted. The wound margin is well defined and not attached to the wound base. There is medium (34-66%) red granulation within the wound bed. There is a medium (34-66%) amount of necrotic tissue within the wound bed including Adherent Slough. Wound #4 status is Open. Original cause of wound was Blister. The wound is located on the  Right,Distal,Lateral Lower Leg. The wound measures 2.5cm length x 2cm width x 0.3cm depth; 3.927cm^2 area and 1.178cm^3 volume. There is Fat Layer (Subcutaneous Tissue) Exposed exposed. There is no tunneling or undermining noted. There is a medium amount of serosanguineous drainage noted. The wound margin is well defined and not attached to the wound base. There is medium (34-66%) red granulation within the wound bed. There is a medium (34-66%) amount of necrotic tissue within the wound bed including Eschar and Adherent Slough. Wound #6 status is Open. Original cause of wound was Gradually Appeared. The wound is located on the Left,Proximal,Lateral Lower Leg. The wound measures 1.9cm length x 1.1cm width x 0.2cm depth; 1.641cm^2 area and 0.328cm^3 volume. There is Fat Layer (Subcutaneous Tissue) Exposed exposed. There is no tunneling or undermining noted. There is a medium amount of serosanguineous drainage noted. The wound margin is well defined and not attached to the wound base. There is medium (34-66%) red granulation within the wound bed. There is a medium (34-66%) amount of necrotic tissue within the wound bed including Adherent Slough. Wound #7 status is Open. Original cause of wound was Blister. The wound is located on the Right,Lateral Lower Leg. The wound measures 0cm length x 0cm width x 0cm depth; 0cm^2 area and 0cm^3 volume. There is no tunneling or undermining noted. There is a none present amount of drainage noted. The wound margin is flat and intact. There is no granulation within the wound bed. There is no necrotic tissue within the wound bed. Wound #8 status is Open. Original cause of wound was Trauma. The wound is located on the Left T Second. The wound measures 0cm length x 0cm width x oe 0cm depth; 0cm^2 area and 0cm^3 volume. There is no tunneling or undermining noted. There is a none present amount of drainage noted. The wound margin is flat and intact. There is no granulation  within the wound bed. There is no necrotic tissue within the wound bed. 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 There was a Four Layer Compression Therapy Procedure with a pre-treatment ABI of 1.1 by Baruch Gouty, RN. Post procedure Diagnosis Wound #1: Same as Pre-Procedure Wound #3 Pre-procedure diagnosis of Wound #3 is a Venous Leg Ulcer located on the Right,Proximal,Lateral Lower Leg . There was a Four Layer Compression Therapy Procedure with a pre-treatment ABI of 1.1 by Baruch Gouty, RN. Post procedure Diagnosis Wound #3: Same as Pre-Procedure Wound #4 Pre-procedure diagnosis  of Wound #4 is a Venous Leg Ulcer located on the Right,Distal,Lateral Lower Leg . There was a Four Layer Compression Therapy Procedure with a pre-treatment ABI of 1.1 by Baruch Gouty, RN. Post procedure Diagnosis Wound #4: Same as Pre-Procedure Wound #6 Pre-procedure diagnosis of Wound #6 is a Venous Leg Ulcer located on the Left,Proximal,Lateral Lower Leg . There was a Four Layer Compression Therapy Procedure with a pre-treatment ABI of 1.1 by Baruch Gouty, RN. Post procedure Diagnosis Wound #6: Same as Pre-Procedure Plan Follow-up Appointments: Return Appointment in 2 weeks. Dressing Change Frequency: Other: - change twice a week by home health. Skin Barriers/Peri-Wound Care: Moisturizing lotion TCA Cream or Ointment - liberally in clinic today mixed with lotion. Wound Cleansing: May shower with protection. - use cast protectors on the days dressings are  not changed. May shower and wash wound with soap and water. - with dressing changes only. Primary Wound Dressing: Wound #1 Left,Lateral Lower Leg: Silver Collagen - moisten with hydrogel. Wound #3 Right,Proximal,Lateral Lower Leg: Silver Collagen - moisten with hydrogel. Wound #4 Right,Distal,Lateral Lower Leg: Silver Collagen - moisten with hydrogel. Wound #6 Left,Proximal,Lateral Lower Leg: Silver Collagen - moisten with hydrogel. Secondary Dressing: ABD pad Other: - pad left medial first met head for protection. Edema Control: 4 layer compression - Bilateral - ensure to wrap from foot to just below calf. Avoid standing for long periods of time Elevate legs to the level of the heart or above for 30 minutes daily and/or when sitting, a frequency of: - throughout the day. Home Health: [place] skilled nursing for wound care. - [facility]. I am using silver collagen moistened with hydrogel to all wound areas. 2. Still under the same compression. 3. All of his wounds appear to be making progress. No debridement is required which in itself is quite an Careers information officer) Signed: 02/22/2020 5:27:17 PM By: Linton Ham MD Entered By: Linton Ham on 02/21/2020 13:15:10 -------------------------------------------------------------------------------- SuperBill Details Patient Name: Date of Service: [place] 02/21/2020 Medical Record Number: [id number] Patient Account Number: [id number] Date of Birth/Sex: Treating RN: May 13, 1941 (79 y.o. 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

## 2020-02-22 NOTE — Progress Notes (Signed)
RAYDEL, HOSICK ([id number]) Visit Report for 02/21/2020 Arrival Information Details Patient Name: Date of Service: [place], [place] 02/21/2020 12:30 PM Medical Record Number: [id number] Patient Account Number: [id number] Date of Birth/Sex: Treating RN: [birth year] (79 y.o. [name] Primary Care Collie Kittel: [name]., [name] Other Clinician: Referring Reyne Falconi: Treating Azaiah Mello/Extender: [name], [name] in Treatment: 19 Visit Information History Since Last Visit Added or deleted any medications: No Patient Arrived: Cane Any new allergies or adverse reactions: No Arrival Time: 12:47 Had a fall or experienced change in No Accompanied By: self activities of daily living that may affect Transfer Assistance: None risk of falls: Patient Identification Verified: Yes Signs or symptoms of abuse/neglect since last visito No Secondary Verification Process Completed: Yes Hospitalized since last visit: No Patient Requires Transmission-Based Precautions: No Implantable device outside of the clinic excluding No Patient Has Alerts: Yes cellular tissue based products placed in the center Patient Alerts: Patient on Blood Thinner since last visit: Has Dressing in Place as Prescribed: Yes Has Compression in Place as Prescribed: Yes Pain Present Now: No Electronic Signature(s) Signed: 02/21/2020 5:29:15 PM By: Kela Millin Entered By: Kela Millin on 02/21/2020 12:48:02 -------------------------------------------------------------------------------- Compression Therapy Details Patient Name: Date of Service: [place] [name] [place] 02/21/2020 12:30 PM Medical Record Number: [id number] Patient Account Number: [id number] Date of Birth/Sex: Treating RN: 23-Jan-1941 (79 y.o. [name] Marland Reine: [name]., [name] Other Clinician: Referring Kamel Haven: Treating Mako Pelfrey/Extender: [name], [name] in  Treatment: 19 Compression Therapy Performed for Wound Assessment: Wound #1 Left,Lateral Lower Leg Performed By: Clinician Baruch Gouty, RN Compression Type: Four Layer Pre Treatment ABI: 1.1 Post Procedure Diagnosis Same as Pre-procedure Electronic Signature(s) Signed: 02/21/2020 5:34:37 PM By: Deon Pilling Entered By: Deon Pilling on 02/21/2020 13:07:12 -------------------------------------------------------------------------------- Compression Therapy Details Patient Name: Date of Service: [place] [name] [facility] 02/21/2020 12:30 PM Medical Record Number: [id number] Patient Account Number: [id number] Date of Birth/Sex: Treating RN: [birth year] (80 y.o. [name] Devaunte Gasparini: [name]., [name] Other Clinician: Referring Latronda Spink: Treating Garrie Woodin/Extender: [name], [name] in Treatment: 19 Compression Therapy Performed for Wound Assessment: Wound #3 Right,Proximal,Lateral Lower Leg Performed By: Clinician Baruch Gouty, RN Compression Type: Four Layer Pre Treatment ABI: 1.1 Post Procedure Diagnosis Same as Pre-procedure Electronic Signature(s) Signed: 02/21/2020 5:34:37 PM By: Deon Pilling Entered By: Deon Pilling on 02/21/2020 13:07:12 -------------------------------------------------------------------------------- Compression Therapy Details Patient Name: Date of Service: [place] [name] [facility] 02/21/2020 12:30 PM Medical Record Number: [id number] Patient Account Number: [id number] Date of Birth/Sex: Treating RN: 10-06-1940 (79 y.o. [name] Johne Buckle: [name]., [name] Other Clinician: Referring Tyri Elmore: Treating Muranda Coye/Extender: [name], [name] in Treatment: 19 Compression Therapy Performed for Wound Assessment: Wound #4 Right,Distal,Lateral Lower Leg Performed By: Clinician Baruch Gouty, RN Compression Type: Four Layer Pre Treatment ABI: 1.1 Post Procedure  Diagnosis Same as Pre-procedure Electronic Signature(s) Signed: 02/21/2020 5:34:37 PM By: Deon Pilling Entered By: Deon Pilling on 02/21/2020 13:07:13 -------------------------------------------------------------------------------- Compression Therapy Details Patient Name: Date of Service: [place] [facility] 02/21/2020 12:30 PM Medical Record Number: [id number] Patient Account Number: [id number] Date of Birth/Sex: Treating RN: 1940-10-03 (79 y.o. [name], [name] Primary Care Kelsey Edman: [name]., [name] Other Clinician: Referring Kamil Mchaffie: Treating Delquan Poucher/Extender: [name], [name] in Treatment: 19 Compression  Therapy Performed for Wound Assessment: Wound #6 Left,Proximal,Lateral Lower Leg Performed By: Clinician Baruch Gouty, RN Compression Type: Four Layer Pre Treatment ABI: 1.1 Post Procedure Diagnosis Same as Pre-procedure Electronic Signature(s) Signed: 02/21/2020 5:34:37 PM By: Deon Pilling Entered By: Deon Pilling on 02/21/2020 13:07:13 -------------------------------------------------------------------------------- Encounter Discharge Information Details Patient Name: Date of Service: [place] 02/21/2020 12:30 PM Medical Record Number: [id number] Patient Account Number: [id number] Date of Birth/Sex: Treating RN: 12-Mar-1941 (79 y.o. [name] Primary Care Kamalei Roeder: [name]., [name] Other Clinician: Referring Soha Thorup: Treating Braidan Ricciardi/Extender: [name], [name] in Treatment: 19 Encounter Discharge Information Items Discharge Condition: Stable Ambulatory Status: Cane Discharge Destination: Home Transportation: Private Auto Accompanied By: self Schedule Follow-up Appointment: Yes Clinical Summary of Care: Patient Declined Electronic Signature(s) Signed: 02/21/2020 5:20:52 PM By: Baruch Gouty RN, BSN Entered By: Baruch Gouty on 02/21/2020  13:36:59 -------------------------------------------------------------------------------- Lower Extremity Assessment Details Patient Name: Date of Service: [place] [facility] 02/21/2020 12:30 PM Medical Record Number: [id number] Patient Account Number: [id number] Date of Birth/Sex: Treating RN: [birth year] (79 y.o. [name] Primary Care Sylvie Mifsud: [name]., [name] Other Clinician: Referring Leea Rambeau: Treating Diamonique Ruedas/Extender: [name], [name] in Treatment: 19 Edema Assessment Assessed: [Left: No] [Right: No] Edema: [Left: Yes] [Right: Yes] Calf Left: Right: Point of Measurement: 43 cm From Medial Instep 38.5 cm 34 cm Ankle Left: Right: Point of Measurement: 14 cm From Medial Instep 22 cm 24 cm Vascular Assessment Pulses: Dorsalis Pedis Palpable: [Left:Yes] [Right:Yes] Electronic Signature(s) Signed: 02/21/2020 5:29:15 PM By: Kela Millin Entered By: Kela Millin on 02/21/2020 12:49:45 -------------------------------------------------------------------------------- Multi Wound Chart Details Patient Name: Date of Service: [place] 02/21/2020 12:30 PM Medical Record Number: [id number] Patient Account Number: [id number] Date of Birth/Sex: Treating RN: Jul 05, 1941 (79 y.o. [name], [name] Primary Care Andrw Mcguirt: [name]., [name] Other Clinician: Referring Mandeep Kiser: Treating Advait Buice/Extender: [name], [name] in Treatment: 19 Vital Signs Height(in): 71 Pulse(bpm): 97 Weight(lbs): 198 Blood Pressure(mmHg): 129/77 Body Mass Index(BMI): 28 Temperature(F): 97.6 Respiratory Rate(breaths/min): 18 Photos: [1:No Photos Left, Lateral Lower Leg] [3:No Photos Right, Proximal, Lateral Lower Leg] [4:No Photos Right, Distal, Lateral Lower Leg] Wound Location: [1:Blister] [3:Blister] [4:Blister] Wounding Event: [1:Venous Leg Ulcer] [3:Venous Leg Ulcer] [4:Venous Leg Ulcer] Primary  Etiology: [1:Anemia, Congestive Heart Failure,] [3:Anemia, Congestive Heart Failure,] [4:Anemia, Congestive Heart Failure,] Comorbid History: [1:Hypertension, Peripheral Venous Disease, 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] [3:Hypertension, Peripheral Venous Disease, 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] [4:Hypertension, Peripheral Venous Disease, 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] Date Acquired: [1:19] [3:19] [4:19] Weeks of Treatment: [1:Open] [3:Open] [4:Open] Wound Status: [1:Yes] [3:No] [4:No] Clustered Wound: [1:1] [3:N/A] [4:N/A] Clustered Quantity: [1:2x1.3x0.2] [3:2.5x1.5x0.3] [4:2.5x2x0.3] Measurements L x W x D (cm) [1:2.042] [3:2.945] [4:3.927] A (cm) : rea [1:0.408] [3:0.884] [4:1.178] Volume (cm) : [1:96.50%] [3:40.50%] [4:57.80%] % Reduction in Area: [1:93.10%] [3:-78.60%] [4:-26.70%] % Reduction in Volume: [1:Full Thickness Without Exposed] [3:Full Thickness Without Exposed] [4:Full Thickness Without Exposed] Classification: [1:Support Structures Medium] [3:Support Structures Medium] [4:Support Structures Medium] Exudate A mount: [1:Serosanguineous] [3:Serosanguineous] [4:Serosanguineous] Exudate Type: [1:red, brown] [3:red, brown] [4:red, brown] Exudate Color: [1:Distinct, outline attached] [3:Well defined, not attached] [4:Well defined, not attached] Wound Margin: [1:Medium (34-66%)] [3:Medium (34-66%)] [4:Medium (34-66%)] Granulation Amount: [1:Red] [3:Red] [4:Red] Granulation Quality: [1:Medium (34-66%)] [3:Medium (34-66%)] [4:Medium (34-66%)] Necrotic Amount: [1:Adherent Slough] [3:Adherent Slough] [4:Eschar, Adherent Slough] Necrotic Tissue: [1:Fat Layer (Subcutaneous Tissue)] [3:Fat Layer (Subcutaneous Tissue)] [4:Fat  Layer (Subcutaneous Tissue)] Exposed Structures: [1:Exposed: Yes Fascia: No Tendon: No Muscle: No Joint: No Bone: No Small (1-33%)] [3:Exposed: Yes Fascia: No Tendon: No Muscle: No Joint: No Bone: No Small (1-33%)] [4:Exposed: Yes Fascia: No Tendon: No Muscle:  No Joint: No Bone: No Small (1-33%)] Epithelialization: [1:Compression Therapy] [3:Compression Therapy] [4:Compression [place] Wound Number: [id number]  Photos: No Photos No Photos No Photos Left, Proximal, Lateral Lower Leg Right, Lateral Lower Leg Left T Second oe Wound Location: Gradually Appeared Blister Trauma Wounding Event: Venous Leg Ulcer Venous Leg Ulcer Skin Tear Primary Etiology: Anemia, Congestive Heart Failure, Anemia, Congestive Heart Failure, Anemia, Congestive Heart Failure, Comorbid History: Hypertension, Peripheral Venous Hypertension, Peripheral Venous Hypertension, Peripheral Venous Disease, End Stage Renal Disease Disease, End Stage Renal Disease Disease, End Stage Renal Disease 12/13/2019 01/24/2020 02/06/2020 Date Acquired: 10 4 2  Weeks of Treatment: Open Open Open Wound Status: No No No Clustered Wound: N/A N/A N/A Clustered Quantity: 1.9x1.1x0.2 0x0x0 0x0x0 Measurements L x W x D (cm) 1.641 0 0 A (cm) : rea 0.328 0 0 Volume (cm) : 50.80% 100.00% 100.00% % Reduction in Area: 67.20% 100.00% 100.00% % Reduction in Volume: Full Thickness Without Exposed Full Thickness Without Exposed Full Thickness Without Exposed Classification: Support Structures Support Structures Support Structures Medium None Present None Present Exudate A mount: Serosanguineous N/A N/A Exudate Type: red, brown N/A N/A Exudate Color: Well defined, not attached Flat and Intact Flat and Intact Wound Margin: Medium (34-66%) None Present (0%) None Present (0%) Granulation Amount: Red N/A N/A Granulation Quality: Medium (34-66%) None Present (0%) None Present (0%) Necrotic Amount: Adherent Slough N/A N/A Necrotic Tissue: Fat Layer (Subcutaneous Tissue) Fascia: No Fascia: No Exposed Structures: Exposed: Yes Fat Layer (Subcutaneous Tissue) Fat Layer (Subcutaneous Tissue) Fascia: No Exposed: No Exposed: No Tendon: No Tendon: No Tendon: No Muscle: No Muscle: No Muscle:  No Joint: No Joint: No Joint: No Bone: No Bone: No Bone: No Small (1-33%) Small (1-33%) Large (67-100%) Epithelialization: Compression Therapy N/A N/A Procedures Performed: Treatment Notes Electronic Signature(s) Signed: 02/21/2020 5:34:37 PM By: Deon Pilling Signed: 02/22/2020 5:27:17 PM By: Linton Ham MD Entered By: Linton Ham on 02/21/2020 13:08:37 -------------------------------------------------------------------------------- Multi-Disciplinary Care Plan Details Patient Name: Date of Service: [place], [place] 02/21/2020 12:30 PM Medical Record Number: [id number] Patient Account Number: [id number] Date of Birth/Sex: Treating RN: 06-Oct-1940 (79 y.o. [name] Julio Storr: [name]., [name] Other Clinician: Referring Xavian Hardcastle: Treating Mikel Hardgrove/Extender: [name], [name] in Treatment: 73 Active Inactive Abuse / Safety / Falls / Self Care Management Nursing Diagnoses: Potential for falls Goals: Patient will remain injury free related to falls Date Initiated: 10/11/2019 Target Resolution Date: 02/29/2020 Goal Status: Active Patient/caregiver will verbalize understanding of the importance to maintain current immunizations/vaccinations Date Initiated: 10/11/2019 Date Inactivated: 11/08/2019 Target Resolution Date: 12/06/2019 Goal Status: Met Interventions: Assess self care needs on admission and as needed Provide education on fall prevention Treatment Activities: Patient referred to home care : 10/11/2019 Notes: Nutrition Nursing Diagnoses: Potential for alteratiion in Nutrition/Potential for imbalanced nutrition Goals: Patient/caregiver agrees to and verbalizes understanding of need to obtain nutritional consultation Date Initiated: 10/11/2019 Date Inactivated: 11/01/2019 Target Resolution Date: 11/09/2019 Goal Status: Met Patient/caregiver agrees to and verbalizes understanding of need to use nutritional  supplements and/or vitamins as prescribed Date Initiated: 10/11/2019 Target Resolution Date: 02/29/2020 Goal Status: Active Interventions: Assess HgA1c results as ordered upon admission and as needed Provide education on nutrition Treatment Activities: Education provided  on Nutrition : 01/03/2020 Obtain HgA1c : 10/11/2019 Patient referred to Primary Care Physician for further nutritional evaluation : 10/11/2019 Notes: Electronic Signature(s) Signed: 02/21/2020 5:34:37 PM By: Deon Pilling Entered By: Deon Pilling on 02/21/2020 13:08:11 -------------------------------------------------------------------------------- Pain Assessment Details Patient Name: Date of Service: [place] [name] [facility] 02/21/2020 12:30 PM Medical Record Number: [id number] Patient Account Number: [id number] Date of Birth/Sex: Treating RN: 1940-11-18 (79 y.o. [name] Primary Care Hatsumi Steinhart: [name]., [name] Other Clinician: Referring Shiva Sahagian: Treating Bianney Rockwood/Extender: [name], [name] in Treatment: 19 Active Problems Location of Pain Severity and Description of Pain Patient Has Paino No Site Locations Pain Management and Medication Current Pain Management: Electronic Signature(s) Signed: 02/21/2020 5:29:15 PM By: Kela Millin Entered By: Kela Millin on 02/21/2020 12:48:29 -------------------------------------------------------------------------------- Patient/Caregiver Education Details Patient Name: Date of Service: [place], [facility] 6/24/2021andnbsp12:30 PM Medical Record Number: [id number] Patient Account Number: [id number] Date of Birth/Gender: Treating RN: 1941-07-16 (79 y.o. [name] Physician: [name]., [name] Other Clinician: Referring Physician: Treating Physician/Extender: [name], [name] in Treatment: 62 Education Assessment Education Provided To: Patient Education Topics  Provided Nutrition: Handouts: Nutrition Methods: Explain/Verbal Responses: Reinforcements needed Electronic Signature(s) Signed: 02/21/2020 5:34:37 PM By: Deon Pilling Entered By: Deon Pilling on 02/21/2020 13:08:28 -------------------------------------------------------------------------------- Wound Assessment Details Patient Name: Date of Service: [place] [name] [facility] 02/21/2020 12:30 PM Medical Record Number: [id number] Patient Account Number: [id number] Date of Birth/Sex: Treating RN: 07-13-1941 (78 y.o. [name] Primary Care Cartier Mapel: [name]., [name] Other Clinician: Referring Kynadi Dragos: Treating Gracey Tolle/Extender: [name], [name] in Treatment: 19 Wound Status Wound Number: 1 Primary Venous Leg Ulcer Etiology: Wound Location: Left, Lateral Lower Leg Wound Open Wounding Event: Blister Status: Date Acquired: 09/11/2019 Comorbid Anemia, Congestive Heart Failure, Hypertension, Peripheral Weeks Of Treatment: 19 History: Venous Disease, End Stage Renal Disease Clustered Wound: Yes Wound Measurements Length: (cm) 2 Width: (cm) 1.3 Depth: (cm) 0.2 Clustered Quantity: 1 Area: (cm) 2.042 Volume: (cm) 0.408 % Reduction in Area: 96.5% % Reduction in Volume: 93.1% Epithelialization: Small (1-33%) Tunneling: No Undermining: No Wound Description Classification: Full Thickness Without Exposed Support Structures Wound Margin: Distinct, outline attached Exudate Amount: Medium Exudate Type: Serosanguineous Exudate Color: red, brown Foul Odor After Cleansing: No Slough/Fibrino Yes Wound Bed Granulation Amount: Medium (34-66%) Exposed Structure Granulation Quality: Red Fascia Exposed: No Necrotic Amount: Medium (34-66%) Fat Layer (Subcutaneous Tissue) Exposed: Yes Necrotic Quality: Adherent Slough Tendon Exposed: No Muscle Exposed: No Joint Exposed: No Bone Exposed: No Treatment Notes Wound #1 (Left, Lateral Lower Leg) 2.  Periwound Care Moisturizing lotion TCA Cream 3. Primary Dressing Applied Collegen AG 4. Secondary Dressing ABD Pad Drawtex 6. Support Layer Applied 4 layer compression wrap Electronic Signature(s) Signed: 02/21/2020 5:29:15 PM By: Kela Millin Entered By: Kela Millin on 02/21/2020 12:50:54 -------------------------------------------------------------------------------- Wound Assessment Details Patient Name: Date of Service: [place] [name] [facility] 02/21/2020 12:30 PM Medical Record Number: [id number] Patient Account Number: [id number] Date of Birth/Sex: Treating RN: 06-25-1941 (79 y.o. [name] Primary Care Dameon Soltis: [name]., [name] Other Clinician: Referring Chrystian Cupples: Treating Virginia Francisco/Extender: [name], [name] in Treatment: 19 Wound Status Wound Number: 3 Primary Venous Leg Ulcer Etiology: Wound Location: Right, Proximal, Lateral Lower Leg Wound Open Wounding Event: Blister Status: Date Acquired: 09/11/2019 Comorbid Anemia, Congestive Heart Failure, Hypertension, Peripheral Weeks Of Treatment: 19  History: Venous Disease, End Stage Renal Disease Clustered Wound: No Wound Measurements Length: (cm) 2.5 Width: (cm) 1.5 Depth: (cm) 0.3 Area: (cm) 2.945 Volume: (cm) 0.884 % Reduction in Area: 40.5% % Reduction in Volume: -78.6% Epithelialization: Small (1-33%) Tunneling: No Undermining: No Wound Description Classification: Full Thickness Without Exposed Support Structures Wound Margin: Well defined, not attached Exudate Amount: Medium Exudate Type: Serosanguineous Exudate Color: red, brown Foul Odor After Cleansing: No Slough/Fibrino Yes Wound Bed Granulation Amount: Medium (34-66%) Exposed Structure Granulation Quality: Red Fascia Exposed: No Necrotic Amount: Medium (34-66%) Fat Layer (Subcutaneous Tissue) Exposed: Yes Necrotic Quality: Adherent Slough Tendon Exposed: No Muscle Exposed: No Joint  Exposed: No Bone Exposed: No Treatment Notes Wound #3 (Right, Proximal, Lateral Lower Leg) 2. Periwound Care Moisturizing lotion TCA Cream 3. Primary Dressing Applied Collegen AG 4. Secondary Dressing ABD Pad Drawtex 6. Support Layer Applied 4 layer compression wrap Electronic Signature(s) Signed: 02/21/2020 5:29:15 PM By: Kela Millin Entered By: Kela Millin on 02/21/2020 12:51:21 -------------------------------------------------------------------------------- Wound Assessment Details Patient Name: Date of Service: [place] [name] [facility] 02/21/2020 12:30 PM Medical Record Number: [id number] Patient Account Number: [id number] Date of Birth/Sex: Treating RN: [birth year] (79 y.o. [name] Primary Care Troyce Febo: [name]., [name] Other Clinician: Referring Zakiah Gauthreaux: Treating Mirta Mally/Extender: [name], [name] in Treatment: 19 Wound Status Wound Number: 4 Primary Venous Leg Ulcer Etiology: Wound Location: Right, Distal, Lateral Lower Leg Wound Open Wounding Event: Blister Status: Date Acquired: 09/11/2019 Comorbid Anemia, Congestive Heart Failure, Hypertension, Peripheral Weeks Of Treatment: 19 History: Venous Disease, End Stage Renal Disease Clustered Wound: No Wound Measurements Length: (cm) 2.5 Width: (cm) 2 Depth: (cm) 0.3 Area: (cm) 3.927 Volume: (cm) 1.178 % Reduction in Area: 57.8% % Reduction in Volume: -26.7% Epithelialization: Small (1-33%) Tunneling: No Undermining: No Wound Description Classification: Full Thickness Without Exposed Support Structures Wound Margin: Well defined, not attached Exudate Amount: Medium Exudate Type: Serosanguineous Exudate Color: red, brown Foul Odor After Cleansing: No Slough/Fibrino Yes Wound Bed Granulation Amount: Medium (34-66%) Exposed Structure Granulation Quality: Red Fascia Exposed: No Necrotic Amount: Medium (34-66%) Fat Layer (Subcutaneous Tissue) Exposed:  Yes Necrotic Quality: Eschar, Adherent Slough Tendon Exposed: No Muscle Exposed: No Joint Exposed: No Bone Exposed: No Treatment Notes Wound #4 (Right, Distal, Lateral Lower Leg) 2. Periwound Care Moisturizing lotion TCA Cream 3. Primary Dressing Applied Collegen AG 4. Secondary Dressing ABD Pad Drawtex 6. Support Layer Applied 4 layer compression wrap Electronic Signature(s) Signed: 02/21/2020 5:29:15 PM By: Kela Millin Entered By: Kela Millin on 02/21/2020 12:51:51 -------------------------------------------------------------------------------- Wound Assessment Details Patient Name: Date of Service: [place] [name] [facility] 02/21/2020 12:30 PM Medical Record Number: [id number] Patient Account Number: [id number] Date of Birth/Sex: Treating RN: [birth year] (79 y.o. [name] Primary Care Kilani Joffe: [name]., [name] Other Clinician: Referring Cele Mote: Treating Raysha Tilmon/Extender: [name], [name] in Treatment: 19 Wound Status Wound Number: 6 Primary Venous Leg Ulcer Etiology: Wound Location: Left, Proximal, Lateral Lower Leg Wound Open Wounding Event: Gradually Appeared Status: Date Acquired: 12/13/2019 Comorbid Anemia, Congestive Heart Failure, Hypertension, Peripheral Weeks Of Treatment: 10 History: Venous Disease, End Stage Renal Disease Clustered Wound: No Wound Measurements Length: (cm) 1.9 Width: (cm) 1.1 Depth: (cm) 0.2 Area: (cm) 1.641 Volume: (cm) 0.328 % Reduction in Area: 50.8% % Reduction in Volume: 67.2% Epithelialization: Small (1-33%) Tunneling: No Undermining: No Wound Description Classification: Full Thickness Without Exposed Support Structures Wound Margin: Well defined, not attached Exudate Amount: Medium  Exudate Type: Serosanguineous Exudate Color: red, brown Foul Odor After Cleansing: No Slough/Fibrino Yes Wound Bed Granulation Amount: Medium (34-66%) Exposed Structure Granulation  Quality: Red Fascia Exposed: No Necrotic Amount: Medium (34-66%) Fat Layer (Subcutaneous Tissue) Exposed: Yes Necrotic Quality: Adherent Slough Tendon Exposed: No Muscle Exposed: No Joint Exposed: No Bone Exposed: No Treatment Notes Wound #6 (Left, Proximal, Lateral Lower Leg) 2. Periwound Care Moisturizing lotion TCA Cream 3. Primary Dressing Applied Collegen AG 4. Secondary Dressing ABD Pad Drawtex 6. Support Layer Applied 4 layer compression wrap Electronic Signature(s) Signed: 02/21/2020 5:29:15 PM By: Kela Millin Entered By: Kela Millin on 02/21/2020 12:52:23 -------------------------------------------------------------------------------- Wound Assessment Details Patient Name: Date of Service: [place] [name] [facility] 02/21/2020 12:30 PM Medical Record Number: [id number] Patient Account Number: [id number] Date of Birth/Sex: Treating RN: [birth year] (79 y.o. [name] Primary Care Sharai Overbay: [name]., [name] Other Clinician: Referring Conan Mcmanaway: Treating Lakeishia Truluck/Extender: [name], [name] in Treatment: 19 Wound Status Wound Number: 7 Primary Venous Leg Ulcer Etiology: Wound Location: Right, Lateral Lower Leg Wound Open Wounding Event: Blister Status: Date Acquired: 01/24/2020 Comorbid Anemia, Congestive Heart Failure, Hypertension, Peripheral Weeks Of Treatment: 4 History: Venous Disease, End Stage Renal Disease Clustered Wound: No Wound Measurements Length: (cm) Width: (cm) Depth: (cm) Area: (cm) Volume: (cm) 0 % Reduction in Area: 100% 0 % Reduction in Volume: 100% 0 Epithelialization: Small (1-33%) 0 Tunneling: No 0 Undermining: No Wound Description Classification: Full Thickness Without Exposed Support Structures Wound Margin: Flat and Intact Exudate Amount: None Present Foul Odor After Cleansing: No Slough/Fibrino No Wound Bed Granulation Amount: None Present (0%) Exposed Structure Necrotic  Amount: None Present (0%) Fascia Exposed: No Fat Layer (Subcutaneous Tissue) Exposed: No Tendon Exposed: No Muscle Exposed: No Joint Exposed: No Bone Exposed: No Electronic Signature(s) Signed: 02/21/2020 5:29:15 PM By: Kela Millin Entered By: Kela Millin on 02/21/2020 12:50:21 -------------------------------------------------------------------------------- Wound Assessment Details Patient Name: Date of Service: [place], [place] 02/21/2020 12:30 PM Medical Record Number: [id number] Patient Account Number: [id number] Date of Birth/Sex: Treating RN: [birth year] (79 y.o. [name] Primary Care Damani Rando: [name]., [name] Other Clinician: Referring Javarion Douty: Treating Mardy Lucier/Extender: [name], [name] in Treatment: 19 Wound Status Wound Number: 8 Primary Skin Tear Etiology: Wound Location: Left T Second oe Wound Open Wounding Event: Trauma Status: Date Acquired: 02/06/2020 Comorbid Anemia, Congestive Heart Failure, Hypertension, Peripheral Weeks Of Treatment: 2 History: Venous Disease, End Stage Renal Disease Clustered Wound: No Wound Measurements Length: (cm) Width: (cm) Depth: (cm) Area: (cm) Volume: (cm) 0 % Reduction in Area: 100% 0 % Reduction in Volume: 100% 0 Epithelialization: Large (67-100%) 0 Tunneling: No 0 Undermining: No Wound Description Classification: Full Thickness Without Exposed Support Structures Wound Margin: Flat and Intact Exudate Amount: None Present Foul Odor After Cleansing: No Slough/Fibrino No Wound Bed Granulation Amount: None Present (0%) Exposed Structure Necrotic Amount: None Present (0%) Fascia Exposed: No Fat Layer (Subcutaneous Tissue) Exposed: No Tendon Exposed: No Muscle Exposed: No Joint Exposed: No Bone Exposed: No Electronic Signature(s) Signed: 02/21/2020 5:29:15 PM By: Kela Millin Entered By: Kela Millin on 02/21/2020  12:52:48 -------------------------------------------------------------------------------- [address] Details Patient Name: Date of Service: [place], [place] 02/21/2020 12:30 PM Medical Record Number: [id number] Patient Account Number: [id number] Date of Birth/Sex: Treating RN: Jan 25, 1941 (79 y.o. [name] Primary Care Hyatt Capobianco: [name]., [name] Other Clinician: Referring Ena Demary: Treating Tereka Thorley/Extender: [name], RO  [name] in Treatment: 19 Vital Signs Time Taken: 12:45 Temperature (F): 97.6 Height (in): 71 Pulse (bpm): 97 Weight (lbs): 198 Respiratory Rate (breaths/min): 18 Body Mass Index (BMI): 27.6 Blood Pressure (mmHg): 129/77 Reference Range: 80 - 120 mg / dl Electronic Signature(s) Signed: 02/21/2020 5:29:15 PM By: Kela Millin Entered By: Kela Millin on 02/21/2020 12:48:23

## 2020-03-06 ENCOUNTER — Encounter (HOSPITAL_BASED_OUTPATIENT_CLINIC_OR_DEPARTMENT_OTHER): Payer: Medicare Other | Attending: Internal Medicine | Admitting: Internal Medicine

## 2020-03-06 DIAGNOSIS — D631 Anemia in chronic kidney disease: Secondary | ICD-10-CM | POA: Insufficient documentation

## 2020-03-06 DIAGNOSIS — S90812A Abrasion, left foot, initial encounter: Secondary | ICD-10-CM | POA: Diagnosis not present

## 2020-03-06 DIAGNOSIS — I132 Hypertensive heart and chronic kidney disease with heart failure and with stage 5 chronic kidney disease, or end stage renal disease: Secondary | ICD-10-CM | POA: Diagnosis not present

## 2020-03-06 DIAGNOSIS — Z7901 Long term (current) use of anticoagulants: Secondary | ICD-10-CM | POA: Diagnosis not present

## 2020-03-06 DIAGNOSIS — L97822 Non-pressure chronic ulcer of other part of left lower leg with fat layer exposed: Secondary | ICD-10-CM | POA: Diagnosis not present

## 2020-03-06 DIAGNOSIS — E1151 Type 2 diabetes mellitus with diabetic peripheral angiopathy without gangrene: Secondary | ICD-10-CM | POA: Insufficient documentation

## 2020-03-06 DIAGNOSIS — E11622 Type 2 diabetes mellitus with other skin ulcer: Secondary | ICD-10-CM | POA: Diagnosis not present

## 2020-03-06 DIAGNOSIS — L97812 Non-pressure chronic ulcer of other part of right lower leg with fat layer exposed: Secondary | ICD-10-CM | POA: Diagnosis present

## 2020-03-06 DIAGNOSIS — N186 End stage renal disease: Secondary | ICD-10-CM | POA: Insufficient documentation

## 2020-03-06 DIAGNOSIS — X58XXXA Exposure to other specified factors, initial encounter: Secondary | ICD-10-CM | POA: Diagnosis not present

## 2020-03-06 DIAGNOSIS — I509 Heart failure, unspecified: Secondary | ICD-10-CM | POA: Insufficient documentation

## 2020-03-06 DIAGNOSIS — I890 Lymphedema, not elsewhere classified: Secondary | ICD-10-CM | POA: Diagnosis not present

## 2020-03-06 DIAGNOSIS — I429 Cardiomyopathy, unspecified: Secondary | ICD-10-CM | POA: Diagnosis not present

## 2020-03-06 DIAGNOSIS — E1122 Type 2 diabetes mellitus with diabetic chronic kidney disease: Secondary | ICD-10-CM | POA: Diagnosis not present

## 2020-03-11 NOTE — Progress Notes (Addendum)
[name], [name] ([id number]) Visit Report for 03/06/2020 HPI Details Patient Name: Date of Service: [place] 03/06/2020 8:00 [place] Record Number: [id number] Patient Account Number: [id number] Date of Birth/Sex: Treating RN: [birth year] (79 y.o. [name] Provider: [name]., [name] Other Clinician: Referring Provider: Treating Provider/Extender: [name], [name] in Treatment: 21 History of Present Illness HPI Description: ADMISSION 10/11/2019 This is a 79 year old man with a history of a severe cardiomyopathy with an ejection fraction of about 20%, chronic renal failure stage III. He is listed as a type II diabetic in epic although the patient denies this. He also has a history of PVD. He states for the last month he has had wounds on his bilateral lower extremities that started off as blisters which denuded. He has areas on the left lateral calf and 2 on the right lateral. He has an area on the left first met head which he did not know was there he we identified this on intake. He has been using Silvadene cream provided by his primary care physician but he is complaining that this burns. 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 He was acutely ill at that time complicating GI bleed. ABIs in our clinic were 1.16 on the right and 1.13 on the left 2/25; the patient comes in with his areas on the left lateral and right lateral calf. There is also an area over the left first MTP bunion deformity. We have been using Sorbact. His edema control is fairly good 3/4; left lateral and  right lateral calf. Most of his wounds are in the same position tightly adherent nonviable debris. On the right we debrided the superior wound on the left both wounds. The area on his bunion over the left first MTP medially is I think just about closed. We have been using Sorbact without a lot of success changed to Iodoflex under compression 3/11; left lateral and right lateral calf perhaps minor improvement in the surface condition. We have been using Iodoflex. The area over the bunion of the left first MTP has closed over 3/18; left lateral and right lateral calf not much improvement. We have been using Iodoflex. Aggressive debridement last week. 3/25; left lateral and right lateral calf. We have been using Iodoflex. There is some improvement in the superior area on the right and 2 on the lateral left although there is still a lot of debris on the surface. The inferior area on the right is still a completely nonviable surface. 4/8; left lateral and right lateral calfs. Essentially mirror-image looking wounds 2 wounds on each side in close juxtaposition we have been using Iodoflex with some improvement in the very adherent fibrinous debris but not a lot. The patient has arterial studies next Wednesday morning and venous studies next Thursday morning. I have been avoiding any further aggressive debridement until we see the arterial study results. His ABIs were fairly good in this clinic and is dorsalis pedis pulses are palpable but the wound beds are pale. 4/15; left lateral and right lateral calfs. Essentially mirror-image wounds with 2 wounds on each side in close juxtaposition  but with rims of separating normal tissue. We have been using Iodoflex. The base of the wounds has been cleaning up quite nicely ARTERIAL STUDIES were done showing the patient had an ABI on the right of 1.27 with triphasic waveforms and a TBI of 0.90 on the left triphasic waveforms with an ABI of 1.28 and a TBI of 0.87. No  evidence of arterial disease VENOUS REFLUX STUDIES; were done yesterday we do not have this report yet. 4/23; VENOUS REFLUX STUDIES did not show any significant reflux right lower extremity. No evidence of a DVT He did have significant reflux in the common . femoral vein on the left but nothing else was listed as significant. He did not have a DVT. We have been using Iodoflex under compression his wounds are making progress. 4/29; improvements in the wound surface continue. We have been using Iodoflex. He has a 20% out-of-pocket co-pay for Apligraf which is unfortunate. We changed him to Sorbact today 5/6; started on Sorbact last week. Better looking wound surface but not much change in dimensions. 01/24/20-Patient is back at 3 weeks, wound surfaces are about the same but very minimal slough on the right leg wounds, however there is blistering on both legs adjacent to the wounds, patient denies any other symptoms or new symptoms. His studies have been reviewed and do not indicate any significant arterial or venous disease. But he is attending once in 3 weeks with home health changing in between 6/3; patient has 4 wounds 2 on each side of his lateral lower legs. These wounds are somewhat improved. He apparently arrived in clinic last week with a large blister on the right lateral lower leg that had denuded into the wound. They changed to silver alginate. Still under compression. He has straight Medicare and unfortunately has an unlimited co-pay for advanced treatment options. 6/10; his original 4 wounds are all better especially on the left lateral lower leg. 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 6/24; blister on the right lateral lower leg has healed. There is no additional wounds. The traumatic left second toe is also closed. The 4 that he has are all better 7/1; all of the patient's wounds are smaller except for the proximal one on the left lateral calf. We are using silver collagen Electronic Signature(s) Signed: 03/06/2020 5:28:43 PM By: Linton Ham MD Entered By: Linton Ham on 03/06/2020 08:46:02 -------------------------------------------------------------------------------- Physical Exam Details Patient Name: Date of Service: [place] 03/06/2020 8:00 A M Medical Record Number: [id number] Patient Account Number: [id number] Date of Birth/Sex: Treating RN: August 21, 1941 (80 y.o. [name] Provider: [name]., [name] Other Clinician: Referring Provider: Treating Provider/Extender: [name], [name] in Treatment: 21 Constitutional Sitting or standing Blood Pressure is within target range for patient.. Pulse regular and within target range for patient.Marland Kitchen Respirations regular, non-labored and within target range.. Temperature is normal and within the target range for the patient.Marland Kitchen Appears in no distress. Cardiovascular Pedal pulses are palpable. Edema is well controlled. Notes Wound exam No debridement is required however under illumination the surfaces are marginal  there is surface slough here. He has home health and comes every 2 weeks to look at this again and then and determine whether he needs an aggressive debridement. Based this on depth and surface area Electronic Signature(s) Signed: 03/06/2020 5:28:43 PM By: Linton Ham MD Entered By: Linton Ham on 03/06/2020 08:47:07 -------------------------------------------------------------------------------- Physician Orders Details Patient Name: Date of Service: [place] 03/06/2020 8:00 [place]  Record Number: [id number] Patient Account Number: [id number] Date of Birth/Sex: Treating RN: 11-28-1940 (79 y.o. [name] Provider: [name]., [name] Other Clinician: Referring Provider: Treating Provider/Extender: [name], [name] in Treatment: 21 Verbal / Phone Orders: No Diagnosis Coding ICD-10 Coding Code Description [phone] Chronic venous hypertension (idiopathic) with inflammation of bilateral lower extremity I89.0 Lymphedema, not elsewhere classified L97.822 Non-pressure chronic ulcer of other part of left lower leg with fat layer exposed L97.812 Non-pressure chronic ulcer of other part of right lower leg with fat layer exposed S90.812D Abrasion, left foot, subsequent encounter Follow-up Appointments Return Appointment in 2 weeks. Dressing Change Frequency Other: - all wounds - change twice a week by home health Skin Barriers/Peri-Wound Care Moisturizing lotion TCA Cream or Ointment - liberally in clinic today mixed with lotion. Wound Cleansing May shower with protection. - use cast protectors on the days dressings are not changed. May shower and wash wound with soap and water. - with dressing changes only. Primary Wound Dressing Wound #1 Left,Lateral Lower Leg Silver Collagen - moisten with hydrogel Wound #3 Right,Proximal,Lateral Lower Leg Silver Collagen - moisten with hydrogel Wound #4 Right,Distal,Lateral Lower Leg Silver Collagen - moisten with hydrogel Wound #6 Left,Proximal,Lateral Lower Leg Silver Collagen - moisten with hydrogel Secondary Dressing Dry Gauze - all wounds ABD pad - all wounds Other: - pad left medial first met head for protection. Edema Control 4 layer compression - Bilateral - ensure to wrap from foot to just below calf. Avoid standing for long periods of time Elevate legs to the level of the heart or above for 30 minutes daily and/or when sitting, a frequency of: - throughout the day. [facility] for wound care. [facility] Electronic Signature(s) Signed: 03/06/2020 5:28:43 PM By: Linton Ham MD Signed: 03/10/2020 5:04:17 PM By: Levan Hurst RN, BSN Entered By: Levan Hurst on 03/06/2020 08:44:36 -------------------------------------------------------------------------------- Problem List Details Patient Name: Date of Service: [place] 03/06/2020 8:00 [place] Record Number: [id number] Patient Account Number: [id number] Date of Birth/Sex: Treating RN: [birth year] (79 y.o. 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/06/2020 No Yes L97.812 Non-pressure chronic ulcer of other part of right lower leg with fat layer 01/03/2020 No Yes exposed S90.812D Abrasion, left foot, subsequent encounter 02/07/2020 No Yes Inactive Problems ICD-10 Code Description Active Date Inactive Date L97.521 Non-pressure chronic ulcer of other part of left foot limited to breakdown of skin 10/11/2019 10/11/2019 Resolved Problems ICD-10 Code Description Active Date Resolved Date 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 10/11/2019 10/11/2019 Electronic Signature(s) Signed: 03/06/2020 5:28:43  PM By: Linton Ham MD Entered By: Linton Ham on 03/06/2020 08:45:17 -------------------------------------------------------------------------------- Progress Note Details Patient Name: Date of Service: [place]  03/06/2020 8:00 [place] Record Number: [id number] Patient Account Number: [id number] Date of Birth/Sex: Treating RN: May 22, 1941 (79 y.o. [name] Provider: [name]., [name] Other Clinician: Referring Provider: Treating Provider/Extender: [name], [name] in Treatment: 21 Subjective History of Present Illness (HPI) ADMISSION 10/11/2019 This is a 79 year old man with a history of a severe cardiomyopathy with an ejection fraction of about 20%, chronic renal failure stage III. He is listed as a type II diabetic in epic although the patient denies this. He also has a history of PVD. He states for the last month he has had wounds on his bilateral lower extremities that started off as blisters which denuded. He has areas on the left lateral calf and 2 on the right lateral. He has an area on the left first met head which he did not know was there he we identified this on intake. He has been using Silvadene cream provided by his primary care physician but he is complaining that this burns. 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 He was acutely ill at that time complicating GI bleed. ABIs in our clinic were 1.16 on the right and 1.13 on the left 2/25; the patient comes in with his areas on the left lateral and right lateral calf. There is also an area over the left first MTP bunion deformity. We have been using Sorbact. His edema control is fairly good 3/4; left lateral and right lateral calf. Most of his wounds are in the same position tightly adherent nonviable debris. On the right we debrided  the superior wound on the left both wounds. The area on his bunion over the left first MTP medially is I think just about closed. We have been using Sorbact without a lot of success changed to Iodoflex under compression 3/11; left lateral and right lateral calf perhaps minor improvement in the surface condition. We have been using Iodoflex. The area over the bunion of the left first MTP has closed over 3/18; left lateral and right lateral calf not much improvement. We have been using Iodoflex. Aggressive debridement last week. 3/25; left lateral and right lateral calf. We have been using Iodoflex. There is some improvement in the superior area on the right and 2 on the lateral left although there is still a lot of debris on the surface. The inferior area on the right is still a completely nonviable surface. 4/8; left lateral and right lateral calfs. Essentially mirror-image looking wounds 2 wounds on each side in close juxtaposition we have been using Iodoflex with some improvement in the very adherent fibrinous debris but not a lot. The patient has arterial studies next Wednesday morning and venous studies next Thursday morning. I have been avoiding any further aggressive debridement until we see the arterial study results. His ABIs were fairly good in this clinic and is dorsalis pedis pulses are palpable but the wound beds are pale. 4/15; left lateral and right lateral calfs. Essentially mirror-image wounds with  2 wounds on each side in close juxtaposition but with rims of separating normal tissue. We have been using Iodoflex. The base of the wounds has been cleaning up quite nicely ARTERIAL STUDIES were done showing the patient had an ABI on the right of 1.27 with triphasic waveforms and a TBI of 0.90 on the left triphasic waveforms with an ABI of 1.28 and a TBI of 0.87. No evidence of arterial disease VENOUS REFLUX STUDIES; were done yesterday we do not have this report yet. 4/23; VENOUS REFLUX  STUDIES did not show any significant reflux right lower extremity. No evidence of a DVT He did have significant reflux in the common . femoral vein on the left but nothing else was listed as significant. He did not have a DVT. We have been using Iodoflex under compression his wounds are making progress. 4/29; improvements in the wound surface continue. We have been using Iodoflex. He has a 20% out-of-pocket co-pay for Apligraf which is unfortunate. We changed him to Sorbact today 5/6; started on Sorbact last week. Better looking wound surface but not much change in dimensions. 01/24/20-Patient is back at 3 weeks, wound surfaces are about the same but very minimal slough on the right leg wounds, however there is blistering on both legs adjacent to the wounds, patient denies any other symptoms or new symptoms. His studies have been reviewed and do not indicate any significant arterial or venous disease. But he is attending once in 3 weeks with home health changing in between 6/3; patient has 4 wounds 2 on each side of his lateral lower legs. These wounds are somewhat improved. He apparently arrived in clinic last week with a large blister on the right lateral lower leg that had denuded into the wound. They changed to silver alginate. Still under compression. He has straight Medicare and unfortunately has an unlimited co-pay for advanced treatment options. 6/10; his original 4 wounds are all better especially on the left lateral lower leg. 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 6/24; blister on the right lateral lower leg has healed. There is no additional  wounds. The traumatic left second toe is also closed. The 4 that he has are all better 7/1; all of the patient's wounds are smaller except for the proximal one on the left lateral calf. We are using silver collagen Objective Constitutional Sitting or standing Blood Pressure is within target range for patient.. Pulse regular and within target range for patient.Marland Kitchen Respirations regular, non-labored and within target range.. Temperature is normal and within the target range for the patient.Marland Kitchen Appears in no distress. Vitals Time Taken: 7:58 AM, Height: 71 in, Weight: 198 lbs, BMI: 27.6, Temperature: 97.8 F, Pulse: 85 bpm, Respiratory Rate: 18 breaths/min, Blood Pressure: 107/67 mmHg. Cardiovascular Pedal pulses are palpable. Edema is well controlled. General Notes: Wound exam ooNo debridement is required however under illumination the surfaces are marginal there is surface slough here. He has home health and comes every 2 weeks to look at this again and then and determine whether he needs an aggressive debridement. Based this on depth and surface area Integumentary (Hair, Skin) Wound #1 status is  Open. Original cause of wound was Blister. The wound is located on the Left,Lateral Lower Leg. The wound measures 1.3cm length x 0.8cm width x 0.2cm depth; 0.817cm^2 area and 0.163cm^3 volume. There is Fat Layer (Subcutaneous Tissue) Exposed exposed. There is no tunneling or undermining noted. There is a medium amount of serosanguineous drainage noted. The wound margin is distinct with the outline attached to the wound base. There is medium (34-66%) red granulation within the wound bed. There is a medium (34-66%) amount of necrotic tissue within the wound bed including Adherent Slough. Wound #3 status is Open. Original cause of wound was Blister. The wound is located on the Right,Proximal,Lateral Lower Leg. The wound measures 1.8cm length x 1.3cm width x 0.2cm depth; 1.838cm^2 area and 0.368cm^3 volume. There  is Fat Layer (Subcutaneous Tissue) Exposed exposed. There is no tunneling or undermining noted. There is a medium amount of serosanguineous drainage noted. The wound margin is well defined and not attached to the wound base. There is medium (34-66%) red granulation within the wound bed. There is a medium (34-66%) amount of necrotic tissue within the wound bed including Adherent Slough. Wound #4 status is Open. Original cause of wound was Blister. The wound is located on the Right,Distal,Lateral Lower Leg. The wound measures 1.9cm length x 1.5cm width x 0.2cm depth; 2.238cm^2 area and 0.448cm^3 volume. There is Fat Layer (Subcutaneous Tissue) Exposed exposed. There is no tunneling or undermining noted. There is a medium amount of serosanguineous drainage noted. The wound margin is well defined and not attached to the wound base. There is medium (34-66%) red granulation within the wound bed. There is a medium (34-66%) amount of necrotic tissue within the wound bed including Eschar and Adherent Slough. Wound #6 status is Open. Original cause of wound was Gradually Appeared. The wound is located on the Left,Proximal,Lateral Lower Leg. The wound measures 2cm length x 1.2cm width x 0.2cm depth; 1.885cm^2 area and 0.377cm^3 volume. There is Fat Layer (Subcutaneous Tissue) Exposed exposed. There is no tunneling or undermining noted. There is a medium amount of serosanguineous drainage noted. The wound margin is well defined and not attached to the wound base. There is medium (34-66%) red granulation within the wound bed. There is a medium (34-66%) amount of necrotic tissue within the wound bed including Adherent Slough. Assessment Active Problems ICD-10 Chronic venous hypertension (idiopathic) with inflammation of bilateral lower extremity Lymphedema, not elsewhere classified Non-pressure chronic ulcer of other part of left lower leg with fat layer exposed Non-pressure chronic ulcer of other part of  right lower leg with fat layer exposed Abrasion, left foot, subsequent encounter Procedures Wound #1 Pre-procedure diagnosis of Wound #1 is a Venous Leg Ulcer located on the Left,Lateral Lower Leg . There was a Four Layer Compression Therapy Procedure by Levan Hurst, RN. Post procedure Diagnosis Wound #1: Same as Pre-Procedure Wound #3 Pre-procedure diagnosis of Wound #3 is a Venous Leg Ulcer located on the Right,Proximal,Lateral Lower Leg . There was a Four Layer Compression Therapy Procedure by Levan Hurst, RN. Post procedure Diagnosis Wound #3: Same as Pre-Procedure Wound #4 Pre-procedure diagnosis of Wound #4 is a Venous Leg Ulcer located on the Right,Distal,Lateral Lower Leg . There was a Four Layer Compression Therapy Procedure by Levan Hurst, RN. Post procedure Diagnosis Wound #4: Same as Pre-Procedure Wound #6 Pre-procedure diagnosis of Wound #6 is a Venous Leg Ulcer located on the Left,Proximal,Lateral Lower Leg . There was a Four Layer Compression Therapy Procedure by Levan Hurst, RN. Post procedure Diagnosis  Wound #6: Same as Pre-Procedure Plan Follow-up Appointments: Return Appointment in 2 weeks. Dressing Change Frequency: Other: - all wounds - change twice a week by home health Skin Barriers/Peri-Wound Care: Moisturizing lotion TCA Cream or Ointment - liberally in clinic today mixed with lotion. Wound Cleansing: May shower with protection. - use cast protectors on the days dressings are not changed. May shower and wash wound with soap and water. - with dressing changes only. Primary Wound Dressing: Wound #1 Left,Lateral Lower Leg: Silver Collagen - moisten with hydrogel Wound #3 Right,Proximal,Lateral Lower Leg: Silver Collagen - moisten with hydrogel Wound #4 Right,Distal,Lateral Lower Leg: Silver Collagen - moisten with hydrogel Wound #6 Left,Proximal,Lateral Lower Leg: Silver Collagen - moisten with hydrogel Secondary Dressing: Dry Gauze - all  wounds ABD pad - all wounds Other: - pad left medial first met head for protection. Edema Control: 4 layer compression - Bilateral - ensure to wrap from foot to just below calf. Avoid standing for long periods of time Elevate legs to the level of the heart or above for 30 minutes daily and/or when sitting, a frequency of: - throughout the day. Home Health: [facility] skilled nursing for wound care. - [facility] home [facility] #1 continue with moistened silver collagen to all wounds 2. He has home health changing the dressings. When I see him again in 2 weeks I will be looking at dimensions. Aggressive debridement cannot be excluded 3. Continue 4-layer compression Electronic Signature(s) Signed: 03/06/2020 5:28:43 PM By: Linton Ham MD Entered By: Linton Ham on 03/06/2020 08:47:56 -------------------------------------------------------------------------------- SuperBill Details Patient Name: Date of Service: [place] [place] 03/06/2020 Medical Record Number: [id number] Patient Account Number: [id number] Date of Birth/Sex: Treating RN: 1940/12/15 (79 y.o. [name] Provider: [name]., [name] Other Clinician: Referring Provider: Treating Provider/Extender: [name], [name] in Treatment: 21 Diagnosis Coding ICD-10 Codes Code Description [phone] Chronic venous hypertension (idiopathic) with inflammation of bilateral lower extremity I89.0 Lymphedema, not elsewhere classified L97.822 Non-pressure chronic ulcer of other part of left lower leg with fat layer exposed L97.812 Non-pressure chronic ulcer of other part of right lower leg with fat layer exposed S90.812D Abrasion, left foot, subsequent encounter Facility Procedures The patient participates with Medicare or their insurance follows the Medicare Facility Guidelines: CPT4 Description Modifier Quantity Code [id number] BILATERAL: Application of multi-layer venous  compression system; leg (below knee), including ankle and 1 foot. 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

## 2020-03-11 NOTE — Progress Notes (Addendum)
[name], [name] ([id number]) Visit Report for 03/06/2020 Arrival Information Details Patient Name: Date of Service: [place] 03/06/2020 8:00 [name]: [name] Patient Account Patton: [id number] Date of Patton: Treating RN: Jan 28, 1941 (79 y.o. [name] Primary Care [name]: [name]., [name] Other Clinician: Referring Xxavier Noon: Treating Zach Tietje/Extender: [name], [name] in Treatment: 21 Visit Information History Since Last Visit Added or deleted any medications: No Patient Arrived: Ambulatory Any new allergies or adverse reactions: No Arrival Time: 07:56 Had a fall or experienced change in No Accompanied By: self activities of daily living that may affect Transfer Assistance: None risk of falls: Patient Identification Verified: Yes Signs or symptoms of abuse/neglect since last visito No Secondary Verification Process Completed: Yes Hospitalized since last visit: No Patient Requires Transmission-Based Precautions: No Implantable device outside of the clinic excluding No Patient Has Alerts: Yes cellular tissue based products placed in the center Patient Alerts: Patient on Blood Thinner since last visit: Has Dressing in Place as Prescribed: Yes Pain Present Now: No Electronic Signature(s) Signed: 03/07/2020 2:13:12 PM By: Sandre Kitty Entered By: Sandre Kitty on 03/06/2020 07:58:24 -------------------------------------------------------------------------------- Compression Therapy Details Patient Name: Date of Service: [place] 03/06/2020 8:00 [place] Record Patton: [id number] Patient Account Patton: [id number] Date of Patton: Treating RN: 03-31-1941 (79 y.o. [name] Primary Care Contina Strain: [name]., [name] Other Clinician: Referring [name]: Treating [name]/Extender: [name], [name] in Treatment: 21 Compression Therapy Performed for  Wound Assessment: Wound #1 Left,Lateral Lower Leg Performed By: Clinician Troy Hurst, RN Compression Type: Four Layer Post Procedure Diagnosis Same as Pre-procedure Electronic Signature(s) Signed: 03/10/2020 5:04:17 PM By: Troy Hurst RN, BSN Entered By: [name] on 03/06/2020 08:45:22 -------------------------------------------------------------------------------- Compression Therapy Details Patient Name: Date of Service: [place] 03/06/2020 8:00 [place] Record Patton: [id number] Patient Account Patton: [id number] Date of Patton: Treating RN: [birth year] (79 y.o. [name] Primary Care [name]: [name]., [name] Other Clinician: Referring [name]: Treating [name]/Extender: [name], [name] in Treatment: 21 Compression Therapy Performed for Wound Assessment: Wound #3 Right,Proximal,Lateral Lower Leg Performed By: Clinician Troy Hurst, RN Compression Type: Four Layer Post Procedure Diagnosis Same as Pre-procedure Electronic Signature(s) Signed: 03/10/2020 5:04:17 PM By: Troy Hurst RN, BSN Entered By: [name] on 03/06/2020 08:45:22 -------------------------------------------------------------------------------- Compression Therapy Details Patient Name: Date of Service: [place] 03/06/2020 8:00 [place] Record Patton: [id number] Patient Account Patton: [id number] Date of Patton: Treating RN: 28-Oct-1940 (79 y.o. [name] Primary Care [name]: [name]., [name] Other Clinician: Referring Thirza Pellicano: Treating [name]/Extender: [name], [name] in Treatment: 21 Compression Therapy Performed for Wound Assessment: Wound #4 Right,Distal,Lateral Lower Leg Performed By: Clinician Troy Hurst, RN Compression Type: Four Layer Post Procedure Diagnosis Same as Pre-procedure Electronic Signature(s) Signed: 03/10/2020 5:04:17 PM By: Troy Hurst  RN, BSN Entered By: [name] on 03/06/2020 08:45:22 -------------------------------------------------------------------------------- Compression Therapy Details Patient Name: Date of Service: [place] 03/06/2020 8:00 A M Medical Record Patton: [id number] Patient Account Patton: [id number] Date of Patton: Treating RN: [birth year] (79 y.o. [name] Primary Care [name]: [name]., [name] Other Clinician: Referring [name]: Treating [name]/Extender: [name], [name] in Treatment: 21 Compression Therapy Performed for Wound Assessment: Wound #6 Left,Proximal,Lateral  Lower Leg Performed By: Clinician Troy Hurst, RN Compression Type: Four Layer Post Procedure Diagnosis Same as Pre-procedure Electronic Signature(s) Signed: 03/10/2020 5:04:17 PM By: Troy Hurst RN, BSN Signed: 03/10/2020 5:04:17 PM By: Troy Hurst RN, BSN Entered By: [name] on 03/06/2020 08:45:22 -------------------------------------------------------------------------------- Encounter Discharge Information Details Patient Name: Date of Service: [place], [place] 03/06/2020 8:00 [place] Patton: [id number] Patient Account Patton: [id number] Date of Patton: Treating RN: May 27, 1941 (78 y.o. [name]) [name] Primary Care [name]: [name]., [name] Other Clinician: Referring Tannar Broker: Treating Chante Mayson/Extender: [name], [name] in Treatment: 21 Encounter Discharge Information Items Discharge Condition: Stable Ambulatory Status: Cane Discharge Destination: Home Transportation: Private Auto Accompanied By: self Schedule Follow-up Appointment: Yes Clinical Summary of Care: Patient Declined Electronic Signature(s) Signed: 03/10/2020 5:13:16 PM By: Troy Coria RN Entered By: [name] on 03/06/2020 09:04:17 -------------------------------------------------------------------------------- Lower  Extremity Assessment Details Patient Name: Date of Service: [name] [facility] 03/06/2020 8:00 [place] Patton: [id number] Patient Account Patton: [id number] Date of Patton: Treating RN: 10/14/1940 (78 y.o. [name]) [name] Primary Care Seletha Zimmermann: [name]., [name] Other Clinician: Referring Jaxyn Mestas: Treating Loraine Freid/Extender: [name], [name] in Treatment: 21 Edema Assessment Assessed: [Left: No] [Right: No] Edema: [Left: Yes] [Right: Yes] Calf Left: Right: Point of Measurement: 43 cm From Medial Instep cm 39 cm Ankle Left: Right: Point of Measurement: 14 cm From Medial Instep cm 21 cm Electronic Signature(s) Signed: 03/10/2020 5:13:16 PM By: Troy Coria RN Entered By: [name] on 03/06/2020 08:39:00 -------------------------------------------------------------------------------- Multi Wound Chart Details Patient Name: Date of Service: [place], [address] 03/06/2020 8:00 [place] Record Patton: [id number] Patient Account Patton: [id number] Date of Patton: Treating RN: 1940/09/05 (79 y.o. [name] Primary Care Bethanny Toelle: [name]., [name] Other Clinician: Referring Gautham Hewins: Treating Johnson Arizola/Extender: [name], [name] in Treatment: 21 Vital Signs Height(in): 71 Pulse(bpm): 85 Weight(lbs): 198 Blood Pressure(mmHg): 107/67 Body Mass Index(BMI): 28 Temperature(F): 97.8 Respiratory Rate(breaths/min): 18 Photos: [1:No Photos Left, Lateral Lower Leg] [3:No Photos Right, Proximal, Lateral Lower Leg] [4:No Photos Right, Distal, Lateral Lower Leg] Wound Location: [1:Blister] [3:Blister] [4:Blister] Wounding Event: [1:Venous Leg Ulcer] [3:Venous Leg Ulcer] [4:Venous Leg Ulcer] Primary Etiology: [1:Anemia, Congestive Heart Failure,] [3:Anemia, Congestive Heart Failure,] [4:Anemia, Congestive Heart Failure,] Comorbid History: [1:Hypertension, Peripheral Venous Disease, End Stage Renal  Disease 09/11/2019] [3:Hypertension, Peripheral Venous Disease, 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] [4:Hypertension, Peripheral Venous Disease, 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] Date Acquired: [1:21] [3:21] [4:21] Weeks of Treatment: [1:Open] [3:Open] [4:Open] Wound Status: [1:Yes] [3:No] [4:No] Clustered Wound: [1:1] [3:N/A] [4:N/A] Clustered Quantity: [1:1.3x0.8x0.2] [3:1.8x1.3x0.2] [4:1.9x1.5x0.2] Measurements L x W x D (cm) [1:0.817] [3:1.838] [4:2.238] A (cm) : rea [1:0.163] [3:0.368] [4:0.448] Volume (cm) : [1:98.60%] [3:62.90%] [4:75.90%] % Reduction in Area: [1:97.20%] [3:25.70%] [4:51.80%] % Reduction in Volume: [1:Full Thickness Without Exposed] [3:Full Thickness Without Exposed] [4:Full Thickness Without Exposed] Classification: [1:Support Structures Medium] [3:Support Structures Medium] [4:Support Structures Medium] Exudate A mount: [1:Serosanguineous] [3:Serosanguineous] [4:Serosanguineous] Exudate Type: [1:red, brown] [3:red, brown] [4:red, brown] Exudate Color: [1:Distinct, outline attached] [3:Well defined, not attached] [4:Well defined, not attached] Wound Margin: [1:Medium (34-66%)] [3:Medium (34-66%)] [4:Medium (34-66%)] Granulation Amount: [1:Red] [3:Red] [4:Red] Granulation Quality: [1:Medium (34-66%)] [3:Medium (34-66%)] [4:Medium (34-66%)] Necrotic Amount: [1:Adherent Slough] [3:Adherent Slough] [4:Eschar, Adherent Slough] Necrotic Tissue: [1:Fat Layer (Subcutaneous Tissue)] [3:Fat Layer (Subcutaneous Tissue)] [4:Fat Layer (Subcutaneous Tissue)] Exposed Structures: [1:Exposed: Yes Fascia:  No Tendon: No Muscle: No Joint: No Bone: No Small (1-33%)] [3:Exposed: Yes Fascia: No Tendon: No Muscle: No Joint: No Bone: No Small (1-33%)] [4:Exposed: Yes Fascia: No Tendon: No Muscle: No Joint: No Bone: No Small (1-33%)] Epithelialization: [1:Compression Therapy] [3:Compression Therapy] [4:Compression Therapy] Wound Patton: 6 N/A N/A Photos: No Photos N/A N/A Left, Proximal,  Lateral Lower Leg N/A N/A Wound Location: Gradually Appeared N/A N/A Wounding Event: Venous Leg Ulcer N/A N/A Primary Etiology: Anemia, Congestive Heart Failure, N/A N/A Comorbid History: Hypertension, Peripheral Venous Disease, End Stage Renal Disease 12/13/2019 N/A N/A Date Acquired: 12 N/A N/A Weeks of Treatment: Open N/A N/A Wound Status: No N/A N/A Clustered Wound: N/A N/A N/A Clustered Quantity: 2x1.2x0.2 N/A N/A Measurements L x W x D (cm) 1.885 N/A N/A A (cm) : rea 0.377 N/A N/A Volume (cm) : 43.50% N/A N/A % Reduction in Area: 62.30% N/A N/A % Reduction in Volume: Full Thickness Without Exposed N/A N/A Classification: Support Structures Medium N/A N/A Exudate A mount: Serosanguineous N/A N/A Exudate Type: red, brown N/A N/A Exudate Color: Well defined, not attached N/A N/A Wound Margin: Medium (34-66%) N/A N/A Granulation Amount: Red N/A N/A Granulation Quality: Medium (34-66%) N/A N/A Necrotic Amount: Adherent Slough N/A N/A Necrotic Tissue: Fat Layer (Subcutaneous Tissue) N/A N/A Exposed Structures: Exposed: Yes Fascia: No Tendon: No Muscle: No Joint: No Bone: No Small (1-33%) N/A N/A Epithelialization: Compression Therapy N/A N/A Procedures Performed: Treatment Notes Electronic Signature(s) Signed: 03/06/2020 5:28:43 PM By: Linton Ham MD Signed: 03/10/2020 5:04:17 PM By: Troy Hurst RN, BSN Entered By: Linton Ham on 03/06/2020 08:45:24 -------------------------------------------------------------------------------- Multi-Disciplinary Care Plan Details Patient Name: Date of Service: [place], [place] 03/06/2020 8:00 A M Medical Record Patton: [id number] Patient Account Patton: [id number] Date of Patton: Treating RN: 19-May-1941 (79 y.o. [name] Primary Care Jarrod Bodkins: [name]., [name] Other Clinician: Referring Keisi Eckford: Treating Evelin Cake/Extender: [name], [name] in  Treatment: 21 Active Inactive Wound/Skin Impairment Nursing Diagnoses: Knowledge deficit related to ulceration/compromised skin integrity Goals: Patient/caregiver will verbalize understanding of skin care regimen Date Initiated: 10/11/2019 Target Resolution Date: 04/04/2020 Goal Status: Active Interventions: Assess patient/caregiver ability to obtain necessary supplies Assess patient/caregiver ability to perform ulcer/skin care regimen upon admission and as needed Assess ulceration(s) every visit Provide education on ulcer and skin care Treatment Activities: Skin care regimen initiated : 10/11/2019 Topical wound management initiated : 10/11/2019 Notes: Electronic Signature(s) Signed: 03/10/2020 5:04:17 PM By: Troy Hurst RN, BSN Entered By: [name] on 03/06/2020 08:21:06 -------------------------------------------------------------------------------- Pain Assessment Details Patient Name: Date of Service: [place] 03/06/2020 8:00 [place] Record Patton: [id number] Patient Account Patton: [id number] Date of Patton: Treating RN: Nov 14, 1940 (79 y.o. [name] Primary Care Sanaiya Welliver: [name]., [name] Other Clinician: Referring Ramia Sidney: Treating Marjani Kobel/Extender: [name], [name] in Treatment: 21 Active Problems Location of Pain Severity and Description of Pain Patient Has Paino No Site Locations Pain Management and Medication Current Pain Management: Electronic Signature(s) Signed: 03/07/2020 2:13:12 PM By: Sandre Kitty Signed: 03/10/2020 5:04:17 PM By: Troy Hurst RN, BSN Entered By: Sandre Kitty on 03/06/2020 07:58:50 -------------------------------------------------------------------------------- Patient/Caregiver Education Details Patient Name: Date of Service: [place] 7/8/2021andnbsp8:00 [place] Patton: [id number] Patient Account Patton: [id number] Date of  Birth/Gender: Treating RN: Jan 13, 1941 (79 y.o. [name] Primary Care Physician: [name]., [name] Other Clinician: Referring Physician: Treating Physician/Extender: Dellia Nims  Lane Hacker., [name] in Treatment: 21 Education Assessment Education Provided To: Patient Education Topics Provided Wound/Skin Impairment: Methods: Explain/Verbal Responses: State content correctly [employer]) Signed: 03/10/2020 5:04:17 PM By: Troy Hurst RN, BSN Entered By: [name] on 03/06/2020 08:21:18 -------------------------------------------------------------------------------- Wound Assessment Details Patient Name: Date of Service: [place] 03/06/2020 8:00 [place] Record Patton: [id number] Patient Account Patton: [id number] Date of Patton: Treating RN: January 30, 1941 (79 y.o. [name] Primary Care Myishia Kasik: [name]., [name] Other Clinician: Referring Sadhana Frater: Treating Leenah Seidner/Extender: [name], [name] in Treatment: 21 Wound Status Wound Patton: 1 Primary Venous Leg Ulcer Etiology: Wound Location: Left, Lateral Lower Leg Wound Open Wounding Event: Blister Status: Date Acquired: 09/11/2019 Comorbid Anemia, Congestive Heart Failure, Hypertension, Peripheral Weeks Of Treatment: 21 History: Venous Disease, End Stage Renal Disease Clustered Wound: Yes Photos Photo Uploaded By: Mikeal Hawthorne on 03/06/2020 15:05:24 Wound Measurements Length: (cm) 1.3 Width: (cm) 0.8 Depth: (cm) 0.2 Clustered Quantity: 1 Area: (cm) 0.817 Volume: (cm) 0.163 % Reduction in Area: 98.6% % Reduction in Volume: 97.2% Epithelialization: Small (1-33%) Tunneling: No Undermining: No Wound Description Classification: Full Thickness Without Exposed Support Structures Wound Margin: Distinct, outline attached Exudate Amount: Medium Exudate Type: Serosanguineous Exudate Color: red, brown Foul Odor After Cleansing:  No Slough/Fibrino Yes Wound Bed Granulation Amount: Medium (34-66%) Exposed Structure Granulation Quality: Red Fascia Exposed: No Necrotic Amount: Medium (34-66%) Fat Layer (Subcutaneous Tissue) Exposed: Yes Necrotic Quality: Adherent Slough Tendon Exposed: No Muscle Exposed: No Joint Exposed: No Bone Exposed: No Treatment Notes Wound #1 (Left, Lateral Lower Leg) 1. Cleanse With Wound Cleanser Soap and water 3. Primary Dressing Applied Collegen AG Hydrogel or K-Y Jelly 4. Secondary Dressing Dry Gauze 6. Support Layer Applied 4 layer compression wrap Notes netting Electronic Signature(s) Signed: 03/10/2020 5:04:17 PM By: Troy Hurst RN, BSN Signed: 03/10/2020 5:13:16 PM By: Troy Coria RN Entered By: [name] on 03/06/2020 08:39:14 -------------------------------------------------------------------------------- Wound Assessment Details Patient Name: Date of Service: [place] 03/06/2020 8:00 [place] Record Patton: [id number] Patient Account Patton: [id number] Date of Patton: Treating RN: [birth year] (79 y.o. [name] Primary Care Calton H[place]: [name]., [name] Other Clinician: Referring Anelly Samarin: Treating Ariel Dimitri/Extender: [name], [name] in Treatment: 21 Wound Status Wound Patton: 3 Primary Venous Leg Ulcer Etiology: Wound Location: Right, Proximal, Lateral Lower Leg Wound Open Wounding Event: Blister Status: Date Acquired: 09/11/2019 Comorbid Anemia, Congestive Heart Failure, Hypertension, Peripheral Weeks Of Treatment: 21 History: Venous Disease, End Stage Renal Disease Clustered Wound: No Photos Photo Uploaded By: Mikeal Hawthorne on 03/06/2020 14:09:57 Wound Measurements Length: (cm) 1.8 Width: (cm) 1.3 Depth: (cm) 0.2 Area: (cm) 1.838 Volume: (cm) 0.368 % Reduction in Area: 62.9% % Reduction in Volume: 25.7% Epithelialization: Small (1-33%) Tunneling: No Undermining: No Wound  Description Classification: Full Thickness Without Exposed Support Structures Wound Margin: Well defined, not attached Exudate Amount: Medium Exudate Type: Serosanguineous Exudate Color: red, brown Foul Odor After Cleansing: No Slough/Fibrino Yes Wound Bed Granulation Amount: Medium (34-66%) Exposed Structure Granulation Quality: Red Fascia Exposed: No Necrotic Amount: Medium (34-66%) Fat Layer (Subcutaneous Tissue) Exposed: Yes Necrotic Quality: Adherent Slough Tendon Exposed: No Muscle Exposed: No Joint Exposed: No Bone Exposed: No Treatment Notes Wound #3 (Right, Proximal, Lateral Lower Leg) 1. Cleanse With Wound Cleanser Soap and water 3. Primary Dressing Applied Collegen AG Hydrogel or K-Y Jelly 4. Secondary Dressing Dry Gauze 6. Support Layer  Applied 4 layer compression wrap Notes netting Electronic Signature(s) Signed: 03/10/2020 5:04:17 PM By: Troy Hurst RN, BSN Signed: 03/10/2020 5:13:16 PM By: Troy Coria RN Entered By: [name] on 03/06/2020 08:39:28 -------------------------------------------------------------------------------- Wound Assessment Details Patient Name: Date of Service: [place] 03/06/2020 8:00 [place] Record Patton: [id number] Patient Account Patton: [id number] Date of Patton: Treating RN: [birth year] (79 y.o. [name] Primary Care Derriona Branscom: [name]., [name] Other Clinician: Referring Alaylah Heatherington: Treating Ayleen Mckinstry/Extender: [name], [name] in Treatment: 21 Wound Status Wound Patton: 4 Primary Venous Leg Ulcer Etiology: Wound Location: Right, Distal, Lateral Lower Leg Wound Open Wounding Event: Blister Status: Date Acquired: 09/11/2019 Comorbid Anemia, Congestive Heart Failure, Hypertension, Peripheral Weeks Of Treatment: 21 History: Venous Disease, End Stage Renal Disease Clustered Wound: No Photos Photo Uploaded By: Mikeal Hawthorne on 03/06/2020 14:09:58 Wound  Measurements Length: (cm) 1.9 Width: (cm) 1.5 Depth: (cm) 0.2 Area: (cm) 2.238 Volume: (cm) 0.448 % Reduction in Area: 75.9% % Reduction in Volume: 51.8% Epithelialization: Small (1-33%) Tunneling: No Undermining: No Wound Description Classification: Full Thickness Without Exposed Support Structures Wound Margin: Well defined, not attached Exudate Amount: Medium Exudate Type: Serosanguineous Exudate Color: red, brown Foul Odor After Cleansing: No Slough/Fibrino Yes Wound Bed Granulation Amount: Medium (34-66%) Exposed Structure Granulation Quality: Red Fascia Exposed: No Necrotic Amount: Medium (34-66%) Fat Layer (Subcutaneous Tissue) Exposed: Yes Necrotic Quality: Eschar, Adherent Slough Tendon Exposed: No Muscle Exposed: No Joint Exposed: No Bone Exposed: No Treatment Notes Wound #4 (Right, Distal, Lateral Lower Leg) 1. Cleanse With Wound Cleanser Soap and water 3. Primary Dressing Applied Collegen AG Hydrogel or K-Y Jelly 4. Secondary Dressing Dry Gauze 6. Support Layer Applied 4 layer compression wrap Notes netting Electronic Signature(s) Signed: 03/10/2020 5:04:17 PM By: Troy Hurst RN, BSN Signed: 03/10/2020 5:13:16 PM By: Troy Coria RN Entered By: [name] on 03/06/2020 08:40:00 -------------------------------------------------------------------------------- Wound Assessment Details Patient Name: Date of Service: [place], [place] 03/06/2020 8:00 [place] Record Patton: [id number] Patient Account Patton: [id number] Date of Patton: Treating RN: 1941/02/08 (79 y.o. [name] Primary Care Danniella Robben: [name]., [name] Other Clinician: Referring Darrien Laakso: Treating Brittanee Ghazarian/Extender: [name], [name] in Treatment: 21 Wound Status Wound Patton: 6 Primary Venous Leg Ulcer Etiology: Wound Location: Left, Proximal, Lateral Lower Leg Wound Open Wounding Event: Gradually Appeared Status: Date Acquired:  12/13/2019 Comorbid Anemia, Congestive Heart Failure, Hypertension, Peripheral Weeks Of Treatment: 12 History: Venous Disease, End Stage Renal Disease Clustered Wound: No Photos Photo Uploaded By: Mikeal Hawthorne on 03/06/2020 15:05:25 Wound Measurements Length: (cm) 2 Width: (cm) 1.2 Depth: (cm) 0.2 Area: (cm) 1.885 Volume: (cm) 0.377 % Reduction in Area: 43.5% % Reduction in Volume: 62.3% Epithelialization: Small (1-33%) Tunneling: No Undermining: No Wound Description Classification: Full Thickness Without Exposed Support Structures Wound Margin: Well defined, not attached Exudate Amount: Medium Exudate Type: Serosanguineous Exudate Color: red, brown Foul Odor After Cleansing: No Slough/Fibrino Yes Wound Bed Granulation Amount: Medium (34-66%) Exposed Structure Granulation Quality: Red Fascia Exposed: No Necrotic Amount: Medium (34-66%) Fat Layer (Subcutaneous Tissue) Exposed: Yes Necrotic Quality: Adherent Slough Tendon Exposed: No Muscle Exposed: No Joint Exposed: No Bone Exposed: No Treatment Notes Wound #6 (Left, Proximal, Lateral Lower Leg) 1. Cleanse With Wound Cleanser Soap and water 3. Primary Dressing Applied Collegen AG Hydrogel or K-Y Jelly 4. Secondary Dressing Dry Gauze 6. Support Layer Applied 4 layer compression wrap Notes Horticulturist, commercial) Signed:  03/10/2020 5:04:17 PM By: Troy Hurst RN, BSN Signed: 03/10/2020 5:13:16 PM By: Troy Coria RN Entered By: [name] on 03/06/2020 08:40:14 -------------------------------------------------------------------------------- [place] Details Patient Name: Date of Service: [place], [place] 03/06/2020 8:00 [place] Record Patton: [id number] Patient Account Patton: [id number] Date of Patton: Treating RN: 26-Feb-1941 (79 y.o. [name] Primary Care Bre P[place]: [name]., [name] Other Clinician: Referring Chicquita Mendel: Treating Constanza Mincy/Extender: [name], [name] in Treatment: 21 Vital Signs Time Taken: 07:58 Temperature (F): 97.8 Height (in): 71 Pulse (bpm): 85 Weight (lbs): 198 Respiratory Rate (breaths/min): 18 Body Mass Index (BMI): 27.6 Blood Pressure (mmHg): 107/67 Reference Range: 80 - 120 mg / dl Electronic Signature(s) Signed: 03/07/2020 2:13:12 PM By: Sandre Kitty Entered By: Sandre Kitty on 03/06/2020 07:58:44

## 2020-03-20 ENCOUNTER — Encounter (HOSPITAL_BASED_OUTPATIENT_CLINIC_OR_DEPARTMENT_OTHER): Payer: Medicare Other | Admitting: Internal Medicine

## 2020-03-20 DIAGNOSIS — E11622 Type 2 diabetes mellitus with other skin ulcer: Secondary | ICD-10-CM | POA: Diagnosis not present

## 2020-03-20 NOTE — Progress Notes (Signed)
[name], [name] ([id number]) Visit Report for 03/20/2020 Debridement Details Patient Name: Date of Service: [place], [place] 03/20/2020 8:00 [place] Record Number: [id number] Patient Account Number: [id number] Date of Birth/Sex: Treating RN: 1940-10-28 (79 y.o. [name] Provider: [name]., [name] Other Clinician: Referring Provider: Treating Provider/Extender: [name], [name] in Treatment: 23 Debridement Performed for Assessment: Wound #3 Right,Proximal,Lateral Lower Leg Performed By: Physician Ricard Dillon., MD Debridement Type: Debridement Severity of Tissue Pre Debridement: Fat layer exposed Level of Consciousness (Pre-procedure): Awake and Alert Pre-procedure Verification/Time Out Yes - 08:47 Taken: Start Time: 08:47 T Area Debrided (L x W): otal 2.7 (cm) x 1.8 (cm) = 4.86 (cm) Tissue and other material debrided: Viable, Non-Viable, Slough, Subcutaneous, Slough Level: Skin/Subcutaneous Tissue Debridement Description: Excisional Instrument: Curette Bleeding: Minimum Hemostasis Achieved: Pressure End Time: 08:48 Procedural Pain: 0 Post Procedural Pain: 0 Response to Treatment: Procedure was tolerated well Level of Consciousness (Post- Awake and Alert procedure): Post Debridement Measurements of Total Wound Length: (cm) 2.7 Width: (cm) 1.8 Depth: (cm) 0.2 Volume: (cm) 0.763 Character of Wound/Ulcer Post Debridement: Improved Severity of Tissue Post Debridement: Fat layer exposed Post Procedure Diagnosis Same as Pre-procedure Electronic Signature(s) Signed: 03/20/2020 5:02:14 PM By: Levan Hurst RN, BSN Signed: 03/20/2020 5:29:29 PM By: Linton Ham MD Entered By: Linton Ham on 03/20/2020 09:01:36 -------------------------------------------------------------------------------- Debridement Details Patient Name: Date of Service: [place], [place] 03/20/2020 8:00 A M Medical Record Number:  [id number] Patient Account Number: [id number] Date of Birth/Sex: Treating RN: [birth year] (79 y.o. [name] Provider: [name]., [name] Other Clinician: Referring Provider: Treating Provider/Extender: [name], [name] in Treatment: 23 Debridement Performed for Assessment: Wound #4 Right,Distal,Lateral Lower Leg Performed By: Physician Ricard Dillon., MD Debridement Type: Debridement Severity of Tissue Pre Debridement: Fat layer exposed Level of Consciousness (Pre-procedure): Awake and Alert Pre-procedure Verification/Time Out Yes - 08:47 Taken: Start Time: 08:47 T Area Debrided (L x W): otal 1.5 (cm) x 1.1 (cm) = 1.65 (cm) Tissue and other material debrided: Viable, Non-Viable, Slough, Subcutaneous, Slough Level: Skin/Subcutaneous Tissue Debridement Description: Excisional Instrument: Curette Bleeding: Minimum Hemostasis Achieved: Pressure End Time: 08:48 Procedural Pain: 0 Post Procedural Pain: 0 Response to Treatment: Procedure was tolerated well Level of Consciousness (Post- Awake and Alert procedure): Post Debridement Measurements of Total Wound Length: (cm) 1.5 Width: (cm) 1.1 Depth: (cm) 0.2 Volume: (cm) 0.259 Character of Wound/Ulcer Post Debridement: Improved Severity of Tissue Post Debridement: Fat layer exposed Post Procedure Diagnosis Same as Pre-procedure Electronic Signature(s) Signed: 03/20/2020 5:02:14 PM By: Levan Hurst RN, BSN Signed: 03/20/2020 5:29:29 PM By: Linton Ham MD Entered By: Linton Ham on 03/20/2020 09:01:45 -------------------------------------------------------------------------------- HPI Details Patient Name: Date of Service: [place], [place] 03/20/2020 8:00 [place] Record Number: [id number] Patient Account Number: [id number] Date of Birth/Sex: Treating RN: [birth year] (79 y.o. [name] Provider: [name]., [name] Other Clinician: Referring  Provider: Treating Provider/Extender: [name], [name] in Treatment: 23 History of Present Illness HPI Description: ADMISSION 10/11/2019 This is a 79 year old man with a history of a severe cardiomyopathy with an ejection fraction of about 20%, chronic renal failure stage III. He is listed as a type II diabetic in epic although the patient denies this. He also has a history of PVD. He states for the last month he has had wounds  on his bilateral lower extremities that started off as blisters which denuded. He has areas on the left lateral calf and 2 on the right lateral. He has an area on the left first met head which he did not know was there he we identified this on intake. He has been using Silvadene cream provided by his primary care physician but he is complaining that this burns. 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 He was acutely ill at that time complicating GI bleed. ABIs in our clinic were 1.16 on the right and 1.13 on the left 2/25; the patient comes in with his areas on the left lateral and right lateral calf. There is also an area over the left first MTP bunion deformity. We have been using Sorbact. His edema control is fairly good 3/4; left lateral and right lateral calf. Most of his wounds are in the same position tightly adherent nonviable debris. On the right we debrided the superior wound on the left both wounds. The area on his bunion over the left first MTP medially is I think just about closed. We have been using Sorbact without a lot of success changed to Iodoflex under compression 3/11;  left lateral and right lateral calf perhaps minor improvement in the surface condition. We have been using Iodoflex. The area over the bunion of the left first MTP has closed over 3/18; left lateral and right lateral calf not much improvement. We have been using Iodoflex. Aggressive debridement last week. 3/25; left lateral and right lateral calf. We have been using Iodoflex. There is some improvement in the superior area on the right and 2 on the lateral left although there is still a lot of debris on the surface. The inferior area on the right is still a completely nonviable surface. 4/8; left lateral and right lateral calfs. Essentially mirror-image looking wounds 2 wounds on each side in close juxtaposition we have been using Iodoflex with some improvement in the very adherent fibrinous debris but not a lot. The patient has arterial studies next Wednesday morning and venous studies next Thursday morning. I have been avoiding any further aggressive debridement until we see the arterial study results. His ABIs were fairly good in this clinic and is dorsalis pedis pulses are palpable but the wound beds are pale. 4/15; left lateral and right lateral calfs. Essentially mirror-image wounds with 2 wounds on each side in close juxtaposition but with rims of separating normal tissue. We have been using Iodoflex. The base of the wounds has been cleaning up quite nicely ARTERIAL STUDIES were done showing the patient had an ABI on the right of 1.27 with triphasic waveforms and a TBI of 0.90 on the left triphasic waveforms with an ABI of 1.28 and a TBI of 0.87. No evidence of arterial disease VENOUS REFLUX STUDIES; were done yesterday we do not have this report yet. 4/23; VENOUS REFLUX STUDIES did not show any significant reflux right lower extremity. No evidence of a DVT He did have significant reflux in the common . femoral vein on the left but nothing else was listed as significant. He did not have a  DVT. We have been using Iodoflex under compression  his wounds are making progress. 4/29; improvements in the wound surface continue. We have been using Iodoflex. He has a 20% out-of-pocket co-pay for Apligraf which is unfortunate. We changed him to Sorbact today 5/6; started on Sorbact last week. Better looking wound surface but not much change in dimensions. 01/24/20-Patient is back at 3 weeks, wound surfaces are about the same but very minimal slough on the right leg wounds, however there is blistering on both legs adjacent to the wounds, patient denies any other symptoms or new symptoms. His studies have been reviewed and do not indicate any significant arterial or venous disease. But he is attending once in 3 weeks with home health changing in between 6/3; patient has 4 wounds 2 on each side of his lateral lower legs. These wounds are somewhat improved. He apparently arrived in clinic last week with a large blister on the right lateral lower leg that had denuded into the wound. They changed to silver alginate. Still under compression. He has straight Medicare and unfortunately has an unlimited co-pay for advanced treatment options. 6/10; his original 4 wounds are all better especially on the left lateral lower leg. Areas on the right medial have a better looking surface were using silver collagen The blister on the right lateral lower leg from last week has an open area however this looks fairly healthy were using silver alginate on this He comes in today having "stubbed" his left second toe he has an abrasion at the base of the nailbed I think he probably caught the nail which is mycotic. 6/24; blister on the right lateral lower leg has healed. There is no additional wounds. The traumatic left second toe is also closed. The 4 that he has are all better 7/1; all of the patient's wounds are smaller except for the proximal one on the left lateral calf. We are using silver collagen 7/22; patient has  home health. Relatively refractory wounds on the lateral part of both mid calfs. Each of them 2 wounds. The areas on the left are doing much better in fact the distal 1 looks like it is on his way to closure. The right required debridement with adherent fibrinous debris under illumination. We have been using silver collagen. Previous arterial studies were within normal limits he also had venous reflux studies Electronic Signature(s) Signed: 03/20/2020 5:29:29 PM By: Linton Ham MD Entered By: Linton Ham on 03/20/2020 09:03:12 -------------------------------------------------------------------------------- Physical Exam Details Patient Name: Date of Service: [place] 03/20/2020 8:00 [place] Record Number: [id number] Patient Account Number: [id number] Date of Birth/Sex: Treating RN: 1941/02/11 (79 y.o. [name] Provider: [name]., [name] Other Clinician: Referring Provider: Treating Provider/Extender: [name], [name] in Treatment: 23 Constitutional Sitting or standing Blood Pressure is within target range for patient.. Pulse regular and within target range for patient.Marland Kitchen Respirations regular, non-labored and within target range.. Temperature is normal and within the target range for the patient.Marland Kitchen Appears in no distress. Notes Wound exam; no debridement on the left. Both of these look quite good especially the one inferiorly. On the right we have really not had a lot of progress. Aggressive debridement of the fibrinous necrotic surface with a #5 curette Electronic Signature(s) Signed: 03/20/2020 5:29:29 PM By: Linton Ham MD Entered By: Linton Ham on 03/20/2020 09:04:32 -------------------------------------------------------------------------------- Physician Orders Details Patient Name: Date of Service: [place] 03/20/2020 8:00 [place] Number: [id number] Patient  Account Number:  [id number] Date of Birth/Sex: Treating RN: 09-27-1940 (79 y.o. [name] Provider: [name]., [name] Other Clinician: Referring Provider: Treating Provider/Extender: [name], [name] in Treatment: 23 Verbal / Phone Orders: No Diagnosis Coding ICD-10 Coding Code Description [phone] Chronic venous hypertension (idiopathic) with inflammation of bilateral lower extremity I89.0 Lymphedema, not elsewhere classified L97.822 Non-pressure chronic ulcer of other part of left lower leg with fat layer exposed L97.812 Non-pressure chronic ulcer of other part of right lower leg with fat layer exposed S90.812D Abrasion, left foot, subsequent encounter Follow-up Appointments Return Appointment in 2 weeks. Dressing Change Frequency Other: - all wounds - change twice a week by home health Skin Barriers/Peri-Wound Care Moisturizing lotion TCA Cream or Ointment - liberally in clinic today mixed with lotion. Wound Cleansing May shower with protection. - use cast protectors on the days dressings are not changed. May shower and wash wound with soap and water. - with dressing changes only. Primary Wound Dressing Wound #1 Left,Lateral Lower Leg Silver Collagen - moisten with hydrogel Wound #3 Right,Proximal,Lateral Lower Leg Silver Collagen - moisten with hydrogel Wound #4 Right,Distal,Lateral Lower Leg Silver Collagen - moisten with hydrogel Wound #6 Left,Proximal,Lateral Lower Leg Silver Collagen - moisten with hydrogel Secondary Dressing Dry Gauze - all wounds ABD pad - all wounds Other: - pad left medial first met head for protection. Edema Control 4 layer compression - Bilateral - ensure to wrap from foot to just below calf. Avoid standing for long periods of time Elevate legs to the level of the heart or above for 30 minutes daily and/or when sitting, a frequency of: - throughout the day. [place] [facility] for  wound care. [facility] Electronic Signature(s) Signed: 03/20/2020 5:02:14 PM By: Levan Hurst RN, BSN Signed: 03/20/2020 5:29:29 PM By: Linton Ham MD Entered By: Levan Hurst on 03/20/2020 08:50:41 -------------------------------------------------------------------------------- Problem List Details Patient Name: Date of Service: [place], [place] 03/20/2020 8:00 [place] Record Number: [id number] Patient Account Number: [id number] Date of Birth/Sex: Treating RN: 1941-02-16 (79 y.o. [name] Provider: [name]., [name] Other Clinician: Referring Provider: Treating Provider/Extender: [name], [name] in Treatment: 23 Active Problems ICD-10 Encounter Code Description Active Date MDM Diagnosis I87.323 Chronic venous hypertension (idiopathic) with inflammation of bilateral lower 10/11/2019 No Yes extremity I89.0 Lymphedema, not elsewhere classified 10/11/2019 No Yes L97.822 Non-pressure chronic ulcer of other part of left lower leg with fat layer exposed2/06/2020 No Yes L97.812 Non-pressure chronic ulcer of other part of right lower leg with fat layer 01/03/2020 No Yes exposed S90.812D Abrasion, left foot, subsequent encounter 02/07/2020 No Yes Inactive Problems ICD-10 Code Description Active Date Inactive Date L97.521 Non-pressure chronic ulcer of other part of left foot limited to breakdown of skin 10/11/2019 10/11/2019 Resolved Problems ICD-10 Code Description Active Date Resolved Date 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 10/11/2019 10/11/2019 Electronic Signature(s) Signed: 03/20/2020 5:29:29 PM By: Linton Ham MD Entered By: Linton Ham on 03/20/2020 08:59:43 -------------------------------------------------------------------------------- Progress Note Details Patient Name: Date of Service: [place] 03/20/2020 8:00 [place] Record Number:  [id number] Patient Account Number: [id number] Date of Birth/Sex: Treating RN: 09/21/1940 (79 y.o. [name] Provider: [name]., [name] Other Clinician: Referring Provider: Treating Provider/Extender: [name], [name] in Treatment: 23 Subjective  History of Present Illness (HPI) ADMISSION 10/11/2019 This is a 79 year old man with a history of a severe cardiomyopathy with an ejection fraction of about 20%, chronic renal failure stage III. He is listed as a type II diabetic in epic although the patient denies this. He also has a history of PVD. He states for the last month he has had wounds on his bilateral lower extremities that started off as blisters which denuded. He has areas on the left lateral calf and 2 on the right lateral. He has an area on the left first met head which he did not know was there he we identified this on intake. He has been using Silvadene cream provided by his primary care physician but he is complaining that this burns. 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 He was acutely ill at that time complicating GI bleed. ABIs in our clinic were 1.16 on the right and 1.13 on the left 2/25; the patient comes in with his areas on the left lateral and right lateral calf. There is also an area over the left first MTP bunion deformity. We have been using Sorbact. His edema control is fairly good 3/4; left lateral and right lateral calf. Most of his wounds are in the same position tightly adherent nonviable debris. On the right we debrided the superior wound on the left both  wounds. The area on his bunion over the left first MTP medially is I think just about closed. We have been using Sorbact without a lot of success changed to Iodoflex under compression 3/11; left lateral and right lateral calf perhaps minor improvement in the surface condition. We have been using Iodoflex. The area over the bunion of the left first MTP has closed over 3/18; left lateral and right lateral calf not much improvement. We have been using Iodoflex. Aggressive debridement last week. 3/25; left lateral and right lateral calf. We have been using Iodoflex. There is some improvement in the superior area on the right and 2 on the lateral left although there is still a lot of debris on the surface. The inferior area on the right is still a completely nonviable surface. 4/8; left lateral and right lateral calfs. Essentially mirror-image looking wounds 2 wounds on each side in close juxtaposition we have been using Iodoflex with some improvement in the very adherent fibrinous debris but not a lot. The patient has arterial studies next Wednesday morning and venous studies next Thursday morning. I have been avoiding any further aggressive debridement until we see the arterial study results. His ABIs were fairly good in this clinic and is dorsalis pedis pulses are palpable but the wound beds are pale. 4/15; left lateral and right lateral calfs. Essentially mirror-image wounds with 2 wounds on each side in close juxtaposition but with rims of separating normal tissue. We have been using Iodoflex. The base of the wounds has been cleaning up quite nicely ARTERIAL STUDIES were done showing the patient had an ABI on the right of 1.27 with triphasic waveforms and a TBI of 0.90 on the left triphasic waveforms with an ABI of 1.28 and a TBI of 0.87. No evidence of arterial disease VENOUS REFLUX STUDIES;  were done yesterday we do not have this report yet. 4/23; VENOUS REFLUX STUDIES did not show any significant  reflux right lower extremity. No evidence of a DVT He did have significant reflux in the common . femoral vein on the left but nothing else was listed as significant. He did not have a DVT. We have been using Iodoflex under compression his wounds are making progress. 4/29; improvements in the wound surface continue. We have been using Iodoflex. He has a 20% out-of-pocket co-pay for Apligraf which is unfortunate. We changed him to Sorbact today 5/6; started on Sorbact last week. Better looking wound surface but not much change in dimensions. 01/24/20-Patient is back at 3 weeks, wound surfaces are about the same but very minimal slough on the right leg wounds, however there is blistering on both legs adjacent to the wounds, patient denies any other symptoms or new symptoms. His studies have been reviewed and do not indicate any significant arterial or venous disease. But he is attending once in 3 weeks with home health changing in between 6/3; patient has 4 wounds 2 on each side of his lateral lower legs. These wounds are somewhat improved. He apparently arrived in clinic last week with a large blister on the right lateral lower leg that had denuded into the wound. They changed to silver alginate. Still under compression. He has straight Medicare and unfortunately has an unlimited co-pay for advanced treatment options. 6/10; his original 4 wounds are all better especially on the left lateral lower leg. 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 6/24; blister on the right lateral lower leg has healed. There is no additional wounds. The traumatic left second toe is  also closed. The 4 that he has are all better 7/1; all of the patient's wounds are smaller except for the proximal one on the left lateral calf. We are using silver collagen 7/22; patient has home health. Relatively refractory wounds on the lateral part of both mid calfs. Each of them 2 wounds. The areas on the left are doing much better in fact the distal 1 looks like it is on his way to closure. The right required debridement with adherent fibrinous debris under illumination. We have been using silver collagen. Previous arterial studies were within normal limits he also had venous reflux studies Objective Constitutional Sitting or standing Blood Pressure is within target range for patient.. Pulse regular and within target range for patient.Marland Kitchen Respirations regular, non-labored and within target range.. Temperature is normal and within the target range for the patient.Marland Kitchen Appears in no distress. Vitals Time Taken: 8:10 AM, Height: 71 in, Weight: 198 lbs, BMI: 27.6, Temperature: 98.8 F, Pulse: 88 bpm, Respiratory Rate: 19 breaths/min, Blood Pressure: 115/73 mmHg. General Notes: Wound exam; no debridement on the left. Both of these look quite good especially the one inferiorly. ooOn the right we have really not had a lot of progress. Aggressive debridement of the fibrinous necrotic surface with a #5 curette Integumentary (Hair, Skin) Wound #1 status is Open. Original cause of wound was Blister. The wound is located on the Left,Lateral Lower Leg. The wound measures 1.8cm length x  0.9cm width x 0.2cm depth; 1.272cm^2 area and 0.254cm^3 volume. There is Fat Layer (Subcutaneous Tissue) Exposed exposed. There is no tunneling or undermining noted. There is a medium amount of serosanguineous drainage noted. The wound margin is distinct with the outline attached to the wound base. There is medium (34-66%) red granulation within the wound bed. There is a medium (34-66%) amount of necrotic tissue within the  wound bed including Adherent Slough. Wound #3 status is Open. Original cause of wound was Blister. The wound is located on the Right,Proximal,Lateral Lower Leg. The wound measures 2.7cm length x 1.8cm width x 0.2cm depth; 3.817cm^2 area and 0.763cm^3 volume. There is Fat Layer (Subcutaneous Tissue) Exposed exposed. There is no tunneling or undermining noted. There is a medium amount of serosanguineous drainage noted. The wound margin is well defined and not attached to the wound base. There is medium (34-66%) red granulation within the wound bed. There is a medium (34-66%) amount of necrotic tissue within the wound bed including Adherent Slough. Wound #4 status is Open. Original cause of wound was Blister. The wound is located on the Right,Distal,Lateral Lower Leg. The wound measures 1.5cm length x 1.1cm width x 0.2cm depth; 1.296cm^2 area and 0.259cm^3 volume. There is Fat Layer (Subcutaneous Tissue) Exposed exposed. There is no tunneling or undermining noted. There is a medium amount of serosanguineous drainage noted. The wound margin is well defined and not attached to the wound base. There is medium (34-66%) red granulation within the wound bed. There is a medium (34-66%) amount of necrotic tissue within the wound bed including Adherent Slough. Wound #6 status is Open. Original cause of wound was Gradually Appeared. The wound is located on the Left,Proximal,Lateral Lower Leg. The wound measures 2.1cm length x 0.8cm width x 0.2cm depth; 1.319cm^2 area and 0.264cm^3 volume. There is Fat Layer (Subcutaneous Tissue) Exposed exposed. There is no tunneling or undermining noted. There is a medium amount of serosanguineous drainage noted. The wound margin is well defined and not attached to the wound base. There is medium (34-66%) red granulation within the wound bed. There is a medium (34-66%) amount of necrotic tissue within the wound bed including Adherent Slough. Assessment Active  Problems ICD-10 Chronic venous hypertension (idiopathic) with inflammation of bilateral lower extremity Lymphedema, not elsewhere classified Non-pressure chronic ulcer of other part of left lower leg with fat layer exposed Non-pressure chronic ulcer of other part of right lower leg with fat layer exposed Abrasion, left foot, subsequent encounter Procedures Wound #3 Pre-procedure diagnosis of Wound #3 is a Venous Leg Ulcer located on the Right,Proximal,Lateral Lower Leg .Severity of Tissue Pre Debridement is: Fat layer exposed. There was a Excisional Skin/Subcutaneous Tissue Debridement with a total area of 4.86 sq cm performed by Ricard Dillon., MD. With the following instrument(s): Curette to remove Viable and Non-Viable tissue/material. Material removed includes Subcutaneous Tissue and Slough and. No specimens were taken. A time out was conducted at 08:47, prior to the start of the procedure. A Minimum amount of bleeding was controlled with Pressure. The procedure was tolerated well with a pain level of 0 throughout and a pain level of 0 following the procedure. Post Debridement Measurements: 2.7cm length x 1.8cm width x 0.2cm depth; 0.763cm^3 volume. Character of Wound/Ulcer Post Debridement is improved. Severity of Tissue Post Debridement is: Fat layer exposed. 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 There was a Four Layer Compression  Therapy Procedure by Levan Hurst, RN. Post procedure Diagnosis Wound #3: Same as Pre-Procedure Wound #4 Pre-procedure diagnosis of Wound #4 is a Venous Leg Ulcer located on the Right,Distal,Lateral Lower Leg .Severity of Tissue Pre Debridement is: Fat layer exposed. There was a Excisional Skin/Subcutaneous Tissue Debridement with a total area of 1.65 sq cm performed by Ricard Dillon., MD. With the following instrument(s):  Curette to remove Viable and Non-Viable tissue/material. Material removed includes Subcutaneous Tissue and Slough and. No specimens were taken. A time out was conducted at 08:47, prior to the start of the procedure. A Minimum amount of bleeding was controlled with Pressure. The procedure was tolerated well with a pain level of 0 throughout and a pain level of 0 following the procedure. Post Debridement Measurements: 1.5cm length x 1.1cm width x 0.2cm depth; 0.259cm^3 volume. Character of Wound/Ulcer Post Debridement is improved. Severity of Tissue Post Debridement is: Fat layer exposed. 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 There was a Four Layer Compression Therapy Procedure by Levan Hurst, RN. Post procedure Diagnosis Wound #4: Same as Pre-Procedure Wound #1 Pre-procedure diagnosis of Wound #1 is a Venous Leg Ulcer located on the Left,Lateral Lower Leg . There was a Four Layer Compression Therapy Procedure by Levan Hurst, RN. Post procedure Diagnosis Wound #1: Same as Pre-Procedure Wound #6 Pre-procedure diagnosis of Wound #6 is a Venous Leg Ulcer located on the Left,Proximal,Lateral Lower Leg . There was a Four Layer Compression Therapy Procedure by Levan Hurst, RN. Post procedure Diagnosis Wound #6: Same as Pre-Procedure Plan Follow-up Appointments: Return Appointment in 2 weeks. Dressing Change Frequency: Other: - all wounds - change twice a week by home health Skin Barriers/Peri-Wound Care: Moisturizing lotion TCA Cream or Ointment - liberally in clinic today mixed with lotion. Wound Cleansing: May shower with protection. - use cast protectors on the days dressings are not changed. May shower and wash wound with soap and water. - with dressing changes only. Primary Wound Dressing: Wound #1 Left,Lateral Lower Leg: Silver Collagen - moisten with  hydrogel Wound #3 Right,Proximal,Lateral Lower Leg: Silver Collagen - moisten with hydrogel Wound #4 Right,Distal,Lateral Lower Leg: Silver Collagen - moisten with hydrogel Wound #6 Left,Proximal,Lateral Lower Leg: Silver Collagen - moisten with hydrogel Secondary Dressing: Dry Gauze - all wounds ABD pad - all wounds Other: - pad left medial first met head for protection. Edema Control: 4 layer compression - Bilateral - ensure to wrap from foot to just below calf. Avoid standing for long periods of time Elevate legs to the level of the heart or above for 30 minutes daily and/or when sitting, a frequency of: - throughout the day. Home Health: [place] [facility] for [facility]. - [facility] 1. I have continued with silver collagen 2 we are doing well on the left not so well on the right. If there is no further improvements on the right may need to change primary dressings. Electronic Signature(s) Signed: 03/20/2020 5:29:29 PM By: Linton Ham MD Entered By: Linton Ham on 03/20/2020 09:05:33 -------------------------------------------------------------------------------- SuperBill Details Patient Name: Date of Service: [place] 03/20/2020 Medical Record Number: [id number] Patient Account Number: [id number] Date of Birth/Sex: Treating RN: [birth year] (79 y.o. [name] Provider: [name]., [name] Other Clinician: Referring Provider: Treating Provider/Extender: [name], [name] in Treatment: 23 Diagnosis Coding ICD-10  Codes Code Description I87.323 Chronic venous hypertension (idiopathic) with inflammation of bilateral lower extremity I89.0 Lymphedema, not elsewhere classified L97.822 Non-pressure chronic ulcer of other part of left lower leg with fat layer exposed L97.812 Non-pressure chronic ulcer of other part of right lower leg with fat layer exposed S90.812D Abrasion, left foot,  subsequent encounter Facility Procedures The patient participates with Medicare or their insurance follows the Medicare Facility Guidelines: CPT4 Code Description Modifier Quantity [id number] 11042 - DEB SUBQ TISSUE 20 SQ CM/< 1 ICD-10 Diagnosis Description L97.822 Non-pressure chronic ulcer of  other part of left lower leg with fat layer exposed The patient participates with Medicare or their insurance follows the Medicare Facility Guidelines: [id number] (Facility Use Only) 29581LT - APPLY MULTLAY COMPRS LWR LT LEG 59 1 Physician Procedures : CPT4 Code Description Modifier [id number] 11042 - WC PHYS SUBQ TISS 20 SQ CM ICD-10 Diagnosis Description L97.822 Non-pressure chronic ulcer of other part of left lower leg with fat layer exposed Quantity: 1 Electronic Signature(s) Signed: 03/20/2020 5:02:14 PM By: Levan Hurst RN, BSN Signed: 03/20/2020 5:29:29 PM By: Linton Ham MD Entered By: Levan Hurst on 03/20/2020 09:19:18

## 2020-03-20 NOTE — Progress Notes (Signed)
[name], [name] ([id number]) Visit Report for 03/20/2020 Arrival Information Details Patient Name: Date of Service: [place] 03/20/2020 8:00 [place] Record Number: [id number] Patient Account Number: [id number] Date of Birth/Sex: Treating RN: 12-26-1940 (79 y.o. [name] Primary Care Cina Klumpp: [name]., [name] Other Clinician: Referring Jasten Guyette: Treating Dezirae Service/Extender: [name], [name] in Treatment: 23 Visit Information History Since Last Visit Added or deleted any medications: No Patient Arrived: Cane Any new allergies or adverse reactions: No Arrival Time: 08:13 Had a fall or experienced change in No Accompanied By: self activities of daily living that may affect Transfer Assistance: None risk of falls: Patient Identification Verified: Yes Signs or symptoms of abuse/neglect since last visito No Secondary Verification Process Completed: Yes Hospitalized since last visit: No Patient Requires Transmission-Based Precautions: No Implantable device outside of the clinic excluding No Patient Has Alerts: Yes cellular tissue based products placed in the center Patient Alerts: Patient on Blood Thinner since last visit: Has Dressing in Place as Prescribed: Yes Has Compression in Place as Prescribed: Yes Pain Present Now: No Electronic Signature(s) Signed: 03/20/2020 4:44:31 PM By: Kela Millin Entered By: Kela Millin on 03/20/2020 08:13:26 -------------------------------------------------------------------------------- Compression Therapy Details Patient Name: Date of Service: [place] 03/20/2020 8:00 [place] Record Number: [id number] Patient Account Number: [id number] Date of Birth/Sex: Treating RN: Oct 31, 1940 (79 y.o. [name] Primary Care Prudencio Velazco: [name]., [name] Other Clinician: Referring Iaan Oregel: Treating Kollin Udell/Extender: [name], [name] in  Treatment: 23 Compression Therapy Performed for Wound Assessment: Wound #1 Left,Lateral Lower Leg Performed By: Clinician Levan Hurst, RN Compression Type: Four Layer Post Procedure Diagnosis Same as Pre-procedure Electronic Signature(s) Signed: 03/20/2020 5:02:14 PM By: Levan Hurst RN, BSN Entered By: Levan Hurst on 03/20/2020 08:49:57 -------------------------------------------------------------------------------- Compression Therapy Details Patient Name: Date of Service: [place] 03/20/2020 8:00 [place] Record Number: [id number] Patient Account Number: [id number] Date of Birth/Sex: Treating RN: 25-Jan-1941 (79 y.o. [name] Primary Care Rayansh Herbst: [name]., [name] Other Clinician: Referring Angelys Yetman: Treating Unnamed Hino/Extender: [name], [name] in Treatment: 23 Compression Therapy Performed for Wound Assessment: Wound #3 Right,Proximal,Lateral Lower Leg Performed By: Clinician Levan Hurst, RN Compression Type: Four Layer Post Procedure Diagnosis Same as Pre-procedure Electronic Signature(s) Signed: 03/20/2020 5:02:14 PM By: Levan Hurst RN, BSN Entered By: Levan Hurst on 03/20/2020 08:49:57 -------------------------------------------------------------------------------- Compression Therapy Details Patient Name: Date of Service: [place] 03/20/2020 8:00 [place] Record Number: [id number] Patient Account Number: [id number] Date of Birth/Sex: Treating RN: Sep 09, 1940 (79 y.o. [name] Primary Care Shonice Wrisley: [name]., [name] Other Clinician: Referring Digna Countess: Treating Trinitee Horgan/Extender: [name], [name] in Treatment: 23 Compression Therapy Performed for Wound Assessment: Wound #4 Right,Distal,Lateral Lower Leg Performed By: Clinician Levan Hurst, RN Compression Type: Four Layer Post Procedure Diagnosis Same as Pre-procedure Electronic  Signature(s) Signed: 03/20/2020 5:02:14 PM By: Levan Hurst RN, BSN Entered By: Levan Hurst on 03/20/2020 08:49:57 -------------------------------------------------------------------------------- Compression Therapy Details Patient Name: Date of Service: [place] 03/20/2020 8:00 A M Medical Record Number: [id number] Patient Account Number: [id number] Date of Birth/Sex: Treating RN: Dec 17, 1940 (79 y.o. [name] Primary Care Miguel Medal: [name]., [name] Other Clinician: Referring Tucker Steedley: Treating Selicia Windom/Extender: [name], [name] in Treatment: 23 Compression Therapy  Performed for Wound Assessment: Wound #6 Left,Proximal,Lateral Lower Leg Performed By: Clinician Levan Hurst, RN Compression Type: Four Layer Post Procedure Diagnosis Same as Pre-procedure Electronic Signature(s) Signed: 03/20/2020 5:02:14 PM By: Levan Hurst RN, BSN Entered By: Levan Hurst on 03/20/2020 08:49:57 -------------------------------------------------------------------------------- Encounter Discharge Information Details Patient Name: Date of Service: [place] 03/20/2020 8:00 [name] Number: [id number] Patient Account Number: [id number] Date of Birth/Sex: Treating RN: December 06, 1940 (79 y.o. [name] Primary Care Jayleen Scaglione: [name]., [name] Other Clinician: Referring Gyasi Hazzard: Treating Eleana Tocco/Extender: [name], [name] in Treatment: 23 Encounter Discharge Information Items Post Procedure Vitals Discharge Condition: Stable Temperature (F): 98.8 Ambulatory Status: Cane Pulse (bpm): 88 Discharge Destination: Home Respiratory Rate (breaths/min): 18 Transportation: Private Auto Blood Pressure (mmHg): 115/73 Accompanied By: self Schedule Follow-up Appointment: Yes Clinical Summary of Care: Patient Declined Electronic Signature(s) Signed: 03/20/2020 4:56:58 PM By: Baruch Gouty  RN, BSN Entered By: Baruch Gouty on 03/20/2020 09:21:18 -------------------------------------------------------------------------------- Lower Extremity Assessment Details Patient Name: Date of Service: [place] 03/20/2020 8:00 A M Medical Record Number: [id number] Patient Account Number: [id number] Date of Birth/Sex: Treating RN: 05-10-1941 (79 y.o. [name] Primary Care Merryl Buckels: [name]., [name] Other Clinician: Referring Leonor Darnell: Treating Nas Wafer/Extender: [name], [name] in Treatment: 23 Edema Assessment Assessed: [Left: No] [Right: No] Edema: [Left: Yes] [Right: Yes] Calf Left: Right: Point of Measurement: 43 cm From Medial Instep 37.5 cm 37 cm Ankle Left: Right: Point of Measurement: 14 cm From Medial Instep 23 cm 23 cm Vascular Assessment Pulses: Dorsalis Pedis Palpable: [Left:Yes] [Right:Yes] Electronic Signature(s) Signed: 03/20/2020 4:44:31 PM By: Kela Millin Entered By: Kela Millin on 03/20/2020 08:18:33 -------------------------------------------------------------------------------- Multi Wound Chart Details Patient Name: Date of Service: [place] 03/20/2020 8:00 A M Medical Record Number: [id number] Patient Account Number: [id number] Date of Birth/Sex: Treating RN: Aug 22, 1941 (79 y.o. [name] Primary Care Maddux First: [name]., [name] Other Clinician: Referring Bo Rogue: Treating Terilyn Sano/Extender: [name], [name] in Treatment: 23 Vital Signs Height(in): 71 Pulse(bpm): 32 Weight(lbs): 198 Blood Pressure(mmHg): 115/73 Body Mass Index(BMI): 28 Temperature(F): 98.8 Respiratory Rate(breaths/min): 19 Photos: [1:No Photos Left, Lateral Lower Leg] [3:No Photos Right, Proximal, Lateral Lower Leg] [4:No Photos Right, Distal, Lateral Lower Leg] Wound Location: [1:Blister] [3:Blister] [4:Blister] Wounding Event: [1:Venous Leg Ulcer]  [3:Venous Leg Ulcer] [4:Venous Leg Ulcer] Primary Etiology: [1:Anemia, Congestive Heart Failure,] [3:Anemia, Congestive Heart Failure,] [4:Anemia, Congestive Heart Failure,] Comorbid History: [1:Hypertension, Peripheral Venous Disease, 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] [3:Hypertension, Peripheral Venous Disease, 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] [4:Hypertension, Peripheral Venous Disease, 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] Date Acquired: [1:23] [3:23] [4:23] Weeks of Treatment: [1:Open] [3:Open] [4:Open] Wound Status: [1:Yes] [3:No] [4:No] Clustered Wound: [1:1] [3:N/A] [4:N/A] Clustered Quantity: [1:1.8x0.9x0.2] [3:2.7x1.8x0.2] [4:1.5x1.1x0.2] Measurements L x W x D (cm) [1:1.272] [3:3.817] [4:1.296] A (cm) : rea [1:0.254] [3:0.763] [4:0.259] Volume (cm) : [1:97.80%] [3:22.90%] [4:86.10%] % Reduction in A [1:rea: 95.70%] [3:-54.10%] [4:72.20%] % Reduction in Volume: [1:Full Thickness Without Exposed] [3:Full Thickness Without Exposed] [4:Full Thickness Without Exposed] Classification: [1:Support Structures Medium] [3:Support Structures Medium] [4:Support Structures Medium] Exudate A mount: [1:Serosanguineous] [3:Serosanguineous] [4:Serosanguineous] Exudate Type: [1:red, brown] [3:red, brown] [4:red, brown] Exudate Color: [1:Distinct, outline attached] [3:Well defined, not attached] [4:Well defined, not attached] Wound Margin: [1:Medium (34-66%)] [3:Medium (34-66%)] [4:Medium (34-66%)] Granulation A mount: [1:Red] [3:Red] [4:Red] Granulation Quality: [1:Medium (34-66%)] [3:Medium (34-66%)] [4:Medium (34-66%)] Necrotic  A mount: [1:Fat Layer (Subcutaneous Tissue)] [3:Fat Layer (Subcutaneous Tissue)] [4:Fat Layer (Subcutaneous Tissue)] Exposed Structures: [1:Exposed: Yes Fascia: No Tendon: No Muscle: No Joint: No Bone: No Small (1-33%)] [3:Exposed: Yes Fascia: No Tendon: No Muscle: No Joint: No Bone: No Small (1-33%)] [4:Exposed: Yes Fascia: No Tendon: No Muscle: No Joint: No Bone: No Small  (1-33%)] Epithelialization: [1:N/A] [3:Debridement - Excisional] [4:Debridement - Excisional] Debridement: Pre-procedure Verification/Time Out N/A [3:08:47] [4:08:47] Taken: [1:N/A] [3:Subcutaneous, Slough] [4:Subcutaneous, Slough] Tissue Debrided: [1:N/A] [3:Skin/Subcutaneous Tissue] [4:Skin/Subcutaneous Tissue] Level: [1:N/A] [3:4.86] [4:1.65] Debridement A (sq cm): [1:rea N/A] [3:Curette] [4:Curette] Instrument: [1:N/A] [3:Minimum] [4:Minimum] Bleeding: [1:N/A] [3:Pressure] [4:Pressure] Hemostasis A chieved: [1:N/A] [3:0] [4:0] Procedural Pain: [1:N/A] [3:0] [4:0] Post Procedural Pain: [1:N/A] [3:Procedure was tolerated well] [4:Procedure was tolerated well] Debridement Treatment Response: [1:N/A] [3:2.7x1.8x0.2] [4:1.5x1.1x0.2] Post Debridement Measurements L x W x D (cm) [1:N/A] [3:0.763] [4:0.259] Post Debridement Volume: (cm) [1:Compression Therapy] [3:Compression Therapy] [4:Compression Therapy] Procedures Performed: [3:Debridement 6 N/A] [4:Debridement N/A] Photos: [1:No Photos Left, Proximal, Lateral Lower Leg] [3:N/A N/A] [4:N/A N/A] Wound Location: [1:Gradually Appeared] [3:N/A] [4:N/A] Wounding Event: [1:Venous Leg Ulcer] [3:N/A] [4:N/A] Primary Etiology: [1:Anemia, Congestive Heart Failure,] [3:N/A] [4:N/A] Comorbid History: [1:Hypertension, Peripheral Venous Disease, End Stage Renal Disease 12/13/2019] [3:N/A] [4:N/A] Date A cquired: [1:14] [3:N/A] [4:N/A] Weeks of Treatment: [1:Open] [3:N/A] [4:N/A] Wound Status: [1:No] [3:N/A] [4:N/A] Clustered Wound: [1:N/A] [3:N/A] [4:N/A] Clustered Quantity: [1:2.1x0.8x0.2] [3:N/A] [4:N/A] Measurements L x W x D (cm) [1:1.319] [3:N/A] [4:N/A] A (cm) : rea [1:0.264] [3:N/A] [4:N/A] Volume (cm) : [1:60.50%] [3:N/A] [4:N/A] % Reduction in A [1:rea: 73.60%] [3:N/A] [4:N/A] % Reduction in Volume: [1:Full Thickness Without Exposed] [3:N/A] [4:N/A] Classification: [1:Support Structures Medium] [3:N/A] [4:N/A] Exudate A mount:  [1:Serosanguineous] [3:N/A] [4:N/A] Exudate Type: [1:red, brown] [3:N/A] [4:N/A] Exudate Color: [1:Well defined, not attached] [3:N/A] [4:N/A] Wound Margin: [1:Medium (34-66%)] [3:N/A] [4:N/A] Granulation A mount: [1:Red] [3:N/A] [4:N/A] Granulation Quality: [1:Medium (34-66%)] [3:N/A] [4:N/A] Necrotic A mount: [1:Fat Layer (Subcutaneous Tissue)] [3:N/A] [4:N/A] Exposed Structures: [1:Exposed: Yes Fascia: No Tendon: No Muscle: No Joint: No Bone: No Small (1-33%)] [3:N/A] [4:N/A] Epithelialization: [1:N/A] [3:N/A] [4:N/A] Debridement: [1:N/A] [3:N/A] [4:N/A] Tissue Debrided: [1:N/A] [3:N/A] [4:N/A] Level: [1:N/A] [3:N/A] [4:N/A] Debridement A (sq cm): [1:rea N/A] [3:N/A] [4:N/A] Instrument: [1:N/A] [3:N/A] [4:N/A] Bleeding: [1:N/A] [3:N/A] [4:N/A] Hemostasis A chieved: [1:N/A] [3:N/A] [4:N/A] Procedural Pain: [1:N/A] [3:N/A] [4:N/A] Post Procedural Pain: Debridement Treatment Response: N/A [3:N/A] [4:N/A] Post Debridement Measurements L x N/A [3:N/A] [4:N/A] W x D (cm) [1:N/A] [3:N/A] [4:N/A] Post Debridement Volume: (cm) [1:Compression Therapy] [3:N/A] [4:N/A] Treatment Notes Electronic Signature(s) Signed: 03/20/2020 5:02:14 PM By: Levan Hurst RN, BSN Signed: 03/20/2020 5:29:29 PM By: Linton Ham MD Entered By: Linton Ham on 03/20/2020 09:01:21 -------------------------------------------------------------------------------- Multi-Disciplinary Care Plan Details Patient Name: Date of Service: [place] 03/20/2020 8:00 A M Medical Record Number: [id number] Patient Account Number: [id number] Date of Birth/Sex: Treating RN: 1941-02-01 (79 y.o. [name] Primary Care Dalton Mille: [name]., [name] Other Clinician: Referring Tacoya Altizer: Treating Caress Reffitt/Extender: [name], [name] in Treatment: 23 Active Inactive Wound/Skin Impairment Nursing Diagnoses: Knowledge deficit related to ulceration/compromised skin  integrity Goals: Patient/caregiver will verbalize understanding of skin care regimen Date Initiated: 10/11/2019 Target Resolution Date: 04/04/2020 Goal Status: Active Interventions: Assess patient/caregiver ability to obtain necessary supplies Assess patient/caregiver ability to perform ulcer/skin care regimen upon admission and as needed Assess ulceration(s) every visit Provide education on ulcer and skin care Treatment Activities: Skin care regimen initiated : 10/11/2019 Topical wound management initiated : 10/11/2019 Notes: Electronic  Signature(s) Signed: 03/20/2020 5:02:14 PM By: Levan Hurst RN, BSN Entered By: Levan Hurst on 03/20/2020 09:18:38 -------------------------------------------------------------------------------- Pain Assessment Details Patient Name: Date of Service: [place] 03/20/2020 8:00 [place] Record Number: [id number] Patient Account Number: [id number] Date of Birth/Sex: Treating RN: Jan 08, 1941 (79 y.o. [name] Primary Care Kreston Ahrendt: [name]., [name] Other Clinician: Referring Teresina Bugaj: Treating Anikka Marsan/Extender: [name], [name] in Treatment: 23 Active Problems Location of Pain Severity and Description of Pain Patient Has Paino No Site Locations Pain Management and Medication Current Pain Management: Electronic Signature(s) Signed: 03/20/2020 4:44:31 PM By: Kela Millin Entered By: Kela Millin on 03/20/2020 08:18:18 -------------------------------------------------------------------------------- Patient/Caregiver Education Details Patient Name: Date of Service: [place] 7/22/2021andnbsp8:00 [place] Record Number: [id number] Patient Account Number: [id number] Date of Birth/Gender: Treating RN: 21-Mar-1941 (79 y.o. [name] Primary Care Physician: [name]., [name] Other Clinician: Referring Physician: Treating Physician/Extender: [name], [name] in Treatment: 23 Education Assessment Education Provided To: Patient Education Topics Provided Wound/Skin Impairment: Methods: Explain/Verbal Responses: State content correctly Electronic Signature(s) Signed: 03/20/2020 5:02:14 PM By: Levan Hurst RN, BSN Entered By: Levan Hurst on 03/20/2020 09:18:51 -------------------------------------------------------------------------------- Wound Assessment Details Patient Name: Date of Service: [place] 03/20/2020 8:00 [place] Record Number: [id number] Patient Account Number: [id number] Date of Birth/Sex: Treating RN: 24-Jan-1941 (79 y.o. [name] Primary Care Everado Pillsbury: [name]., [name] Other Clinician: Referring Zahari Fazzino: Treating Tychelle Purkey/Extender: [name], [name] in Treatment: 23 Wound Status Wound Number: 1 Primary Venous Leg Ulcer Etiology: Wound Location: Left, Lateral Lower Leg Wound Open Wounding Event: Blister Status: Date Acquired: 09/11/2019 Comorbid Anemia, Congestive Heart Failure, Hypertension, Peripheral Weeks Of Treatment: 23 History: Venous Disease, End Stage Renal Disease Clustered Wound: Yes Photos Photo Uploaded By: Mikeal Hawthorne on 03/20/2020 15:24:21 Wound Measurements Length: (cm) Width: (cm) Depth: (cm) Clustered Quantity: Area: (cm) Volume: (cm) 1.8 % Reduction in Area: 97.8% 0.9 % Reduction in Volume: 95.7% 0.2 Epithelialization: Small (1-33%) 1 Tunneling: No 1.272 Undermining: No 0.254 Wound Description Classification: Full Thickness Without Exposed Support Structures Wound Margin: Distinct, outline attached Exudate Amount: Medium Exudate Type: Serosanguineous Exudate Color: red, brown Foul Odor After Cleansing: No Slough/Fibrino Yes Wound Bed Granulation Amount: Medium (34-66%) Exposed Structure Granulation Quality: Red Fascia Exposed: No Necrotic Amount: Medium (34-66%) Fat Layer  (Subcutaneous Tissue) Exposed: Yes Necrotic Quality: Adherent Slough Tendon Exposed: No Muscle Exposed: No Joint Exposed: No Bone Exposed: No Treatment Notes Wound #1 (Left, Lateral Lower Leg) 2. Periwound Care Moisturizing lotion TCA Cream 3. Primary Dressing Applied Collegen AG 4. Secondary Dressing ABD Pad Other secondary dressing (specify in notes) 6. Support Layer Applied 4 layer compression wrap Notes drawtex Electronic Signature(s) Signed: 03/20/2020 4:44:31 PM By: Kela Millin Entered By: Kela Millin on 03/20/2020 08:40:10 -------------------------------------------------------------------------------- Wound Assessment Details Patient Name: Date of Service: [name] 03/20/2020 8:00 [place] Record Number: [id number] Patient Account Number: [id number] Date of Birth/Sex: Treating RN: 06/12/1941 (79 y.o. [name] Primary Care Alejos Reinhardt: [name]., [name] Other Clinician: Referring Sharita Bienaime: Treating Behr Cislo/Extender: [name], [name] in Treatment: 23 Wound Status Wound Number: 3 Primary Venous Leg Ulcer Etiology: Wound Location: Right, Proximal, Lateral Lower Leg Wound Open Wounding Event: Blister Status: Date Acquired: 09/11/2019 Comorbid Anemia, Congestive Heart Failure, Hypertension, Peripheral Weeks Of  Treatment: 23 History: Venous Disease, End Stage Renal Disease Clustered Wound: No Photos Photo Uploaded By: Mikeal Hawthorne on 03/20/2020 15:24:39 Wound Measurements Length: (cm) 2.7 Width: (cm) 1.8 Depth: (cm) 0.2 Area: (cm) 3.817 Volume: (cm) 0.763 % Reduction in Area: 22.9% % Reduction in Volume: -54.1% Epithelialization: Small (1-33%) Tunneling: No Undermining: No Wound Description Classification: Full Thickness Without Exposed Support Structures Wound Margin: Well defined, not attached Exudate Amount: Medium Exudate Type: Serosanguineous Exudate Color: red, brown Foul Odor  After Cleansing: No Slough/Fibrino Yes Wound Bed Granulation Amount: Medium (34-66%) Exposed Structure Granulation Quality: Red Fascia Exposed: No Necrotic Amount: Medium (34-66%) Fat Layer (Subcutaneous Tissue) Exposed: Yes Necrotic Quality: Adherent Slough Tendon Exposed: No Muscle Exposed: No Joint Exposed: No Bone Exposed: No Treatment Notes Wound #3 (Right, Proximal, Lateral Lower Leg) 2. Periwound Care Moisturizing lotion TCA Cream 3. Primary Dressing Applied Collegen AG 4. Secondary Dressing ABD Pad Other secondary dressing (specify in notes) 6. Support Layer Applied 4 layer compression wrap Notes drawtex Electronic Signature(s) Signed: 03/20/2020 4:44:31 PM By: Kela Millin Entered By: Kela Millin on 03/20/2020 08:40:19 -------------------------------------------------------------------------------- Wound Assessment Details Patient Name: Date of Service: [name] 03/20/2020 8:00 A M Medical Record Number: [id number] Patient Account Number: [id number] Date of Birth/Sex: Treating RN: Feb 21, 1941 (79 y.o. [name] Primary Care Jonta Gastineau: [name]., [name] Other Clinician: Referring Jarah Pember: Treating Ariam Mol/Extender: [name], [name] in Treatment: 23 Wound Status Wound Number: 4 Primary Venous Leg Ulcer Etiology: Wound Location: Right, Distal, Lateral Lower Leg Wound Open Wounding Event: Blister Status: Date Acquired: 09/11/2019 Comorbid Anemia, Congestive Heart Failure, Hypertension, Peripheral Weeks Of Treatment: 23 History: Venous Disease, End Stage Renal Disease Clustered Wound: No Photos Wound Measurements Length: (cm) 1.5 Width: (cm) 1.1 Depth: (cm) 0.2 Area: (cm) 1.296 Volume: (cm) 0.259 % Reduction in Area: 86.1% % Reduction in Volume: 72.2% Epithelialization: Small (1-33%) Tunneling: No Undermining: No Wound Description Classification: Full Thickness Without Exposed  Support Structures Wound Margin: Well defined, not attached Exudate Amount: Medium Exudate Type: Serosanguineous Exudate Color: red, brown Foul Odor After Cleansing: No Slough/Fibrino Yes Wound Bed Granulation Amount: Medium (34-66%) Exposed Structure Granulation Quality: Red Fascia Exposed: No Necrotic Amount: Medium (34-66%) Fat Layer (Subcutaneous Tissue) Exposed: Yes Necrotic Quality: Adherent Slough Tendon Exposed: No Muscle Exposed: No Joint Exposed: No Bone Exposed: No Treatment Notes Wound #4 (Right, Distal, Lateral Lower Leg) 2. Periwound Care Moisturizing lotion TCA Cream 3. Primary Dressing Applied Collegen AG 4. Secondary Dressing ABD Pad Other secondary dressing (specify in notes) 6. Support Layer Applied 4 layer compression wrap Notes drawtex Electronic Signature(s) Signed: 03/20/2020 4:44:31 PM By: Kela Millin Signed: 03/20/2020 5:13:04 PM By: Mikeal Hawthorne EMT/HBOT/SD Entered By: Mikeal Hawthorne on 03/20/2020 15:25:38 -------------------------------------------------------------------------------- Wound Assessment Details Patient Name: Date of Service: [place] 03/20/2020 8:00 [place] Record Number: [id number] Patient Account Number: [id number] Date of Birth/Sex: Treating RN: March 29, 1941 (79 y.o. [name] Primary Care Katharina Jehle: [name]., [name] Other Clinician: Referring Blondie Riggsbee: Treating Sanjit Mcmichael/Extender: [name], [name] in Treatment: 23 Wound Status Wound Number: 6 Primary Venous Leg Ulcer Etiology: Wound Location: Left, Proximal, Lateral Lower Leg Wound Open Wounding Event: Gradually Appeared Status: Date Acquired: 12/13/2019 Comorbid Anemia, Congestive Heart Failure, Hypertension, Peripheral Weeks Of Treatment: 14 History: Venous Disease, End Stage Renal Disease Clustered Wound: No Photos Photo Uploaded By: Mikeal Hawthorne on 03/20/2020 15:24:01 Wound Measurements Length:  (cm) 2.1 Width: (cm)  0.8 Depth: (cm) 0.2 Area: (cm) 1.319 Volume: (cm) 0.264 % Reduction in Area: 60.5% % Reduction in Volume: 73.6% Epithelialization: Small (1-33%) Tunneling: No Undermining: No Wound Description Classification: Full Thickness Without Exposed Support Structu Wound Margin: Well defined, not attached Exudate Amount: Medium Exudate Type: Serosanguineous Exudate Color: red, brown res Foul Odor After Cleansing: No Slough/Fibrino Yes Wound Bed Granulation Amount: Medium (34-66%) Exposed Structure Granulation Quality: Red Fascia Exposed: No Necrotic Amount: Medium (34-66%) Fat Layer (Subcutaneous Tissue) Exposed: Yes Necrotic Quality: Adherent Slough Tendon Exposed: No Muscle Exposed: No Joint Exposed: No Bone Exposed: No Treatment Notes Wound #6 (Left, Proximal, Lateral Lower Leg) 2. Periwound Care Moisturizing lotion TCA Cream 3. Primary Dressing Applied Collegen AG 4. Secondary Dressing ABD Pad Other secondary dressing (specify in notes) 6. Support Layer Applied 4 layer compression wrap Notes drawtex Electronic Signature(s) Signed: 03/20/2020 4:44:31 PM By: Kela Millin Entered By: Kela Millin on 03/20/2020 08:41:20 -------------------------------------------------------------------------------- Vitals Details Patient Name: Date of Service: [place] [facility] [place], [place] 03/20/2020 8:00 [place] Record Number: [id number] Patient Account Number: [id number] Date of Birth/Sex: Treating RN: [birth year] (79 y.o. [name] [facility] Ibrahima Holberg: [name]., [name] Other Clinician: Referring Teyon Odette: Treating Tully Mcinturff/Extender: [name], [name] in Treatment: 23 Vital Signs Time Taken: 08:10 Temperature (F): 98.8 Height (in): 71 Pulse (bpm): 88 Weight (lbs): 198 Respiratory Rate (breaths/min): 19 Body Mass Index (BMI): 27.6 Blood Pressure (mmHg): 115/73 Reference Range: 80 - 120 mg / dl Electronic  Signature(s) Signed: 03/20/2020 4:44:31 PM By: Kela Millin Entered By: Kela Millin on 03/20/2020 08:13:45

## 2020-04-03 ENCOUNTER — Encounter (HOSPITAL_BASED_OUTPATIENT_CLINIC_OR_DEPARTMENT_OTHER): Payer: Medicare Other | Attending: Internal Medicine | Admitting: Physician Assistant

## 2020-04-03 DIAGNOSIS — N186 End stage renal disease: Secondary | ICD-10-CM | POA: Diagnosis not present

## 2020-04-03 DIAGNOSIS — W228XXD Striking against or struck by other objects, subsequent encounter: Secondary | ICD-10-CM | POA: Insufficient documentation

## 2020-04-03 DIAGNOSIS — I87333 Chronic venous hypertension (idiopathic) with ulcer and inflammation of bilateral lower extremity: Secondary | ICD-10-CM | POA: Diagnosis not present

## 2020-04-03 DIAGNOSIS — I132 Hypertensive heart and chronic kidney disease with heart failure and with stage 5 chronic kidney disease, or end stage renal disease: Secondary | ICD-10-CM | POA: Diagnosis not present

## 2020-04-03 DIAGNOSIS — I509 Heart failure, unspecified: Secondary | ICD-10-CM | POA: Insufficient documentation

## 2020-04-03 DIAGNOSIS — S90812A Abrasion, left foot, initial encounter: Secondary | ICD-10-CM | POA: Insufficient documentation

## 2020-04-03 DIAGNOSIS — E1122 Type 2 diabetes mellitus with diabetic chronic kidney disease: Secondary | ICD-10-CM | POA: Insufficient documentation

## 2020-04-03 DIAGNOSIS — L97822 Non-pressure chronic ulcer of other part of left lower leg with fat layer exposed: Secondary | ICD-10-CM | POA: Insufficient documentation

## 2020-04-03 DIAGNOSIS — I429 Cardiomyopathy, unspecified: Secondary | ICD-10-CM | POA: Diagnosis not present

## 2020-04-03 DIAGNOSIS — I890 Lymphedema, not elsewhere classified: Secondary | ICD-10-CM | POA: Diagnosis not present

## 2020-04-03 DIAGNOSIS — L97812 Non-pressure chronic ulcer of other part of right lower leg with fat layer exposed: Secondary | ICD-10-CM | POA: Insufficient documentation

## 2020-04-03 DIAGNOSIS — L97521 Non-pressure chronic ulcer of other part of left foot limited to breakdown of skin: Secondary | ICD-10-CM | POA: Diagnosis not present

## 2020-04-03 DIAGNOSIS — L97112 Non-pressure chronic ulcer of right thigh with fat layer exposed: Secondary | ICD-10-CM | POA: Insufficient documentation

## 2020-04-03 NOTE — Progress Notes (Signed)
FREDERIC, TONES ([id number]) Visit Report for 04/03/2020 Chief Complaint Document Details Patient Name: Date of Service: [place], [place] 04/03/2020 8:00 [place] Record Number: [id number] Patient Account Number: [id number] Date of Birth/Sex: Treating RN: 01-19-1941 (79 y.o. [name] Provider: [name]., [name] Other Clinician: Referring Provider: Treating Provider/Extender: Gareth Morgan., [name] in Treatment: 25 Information Obtained from: Patient Chief Complaint 10/11/2019; patient is here for review of wounds on his bilateral lower extremities and left foot Electronic Signature(s) Signed: 04/03/2020 6:02:08 PM By: Worthy Keeler PA-C Entered By: Worthy Keeler on 04/03/2020 07:57:17 -------------------------------------------------------------------------------- Debridement Details Patient Name: Date of Service: [place], [place] 04/03/2020 8:00 [place] Record Number: [id number] Patient Account Number: [id number] Date of Birth/Sex: Treating RN: 24-Feb-1941 (79 y.o. [name] Provider: [name]., [name] Other Clinician: Referring Provider: Treating Provider/Extender: Gareth Morgan., [name] in Treatment: 25 Debridement Performed for Assessment: Wound #3 Right,Proximal,Lateral Lower Leg Performed By: Physician Worthy Keeler, PA Debridement Type: Debridement Severity of Tissue Pre Debridement: Fat layer exposed Level of Consciousness (Pre-procedure): Awake and Alert Pre-procedure Verification/Time Out Yes - 08:35 Taken: Start Time: 08:35 T Area Debrided (L x W): otal 1.7 (cm) x 1 (cm) = 1.7 (cm) Tissue and other material debrided: Viable, Non-Viable, Slough, Subcutaneous, Biofilm, Slough Level: Skin/Subcutaneous Tissue Debridement Description: Excisional Instrument: Curette Bleeding: Minimum Hemostasis Achieved: Pressure End Time: 08:36 Procedural Pain: 4 Post Procedural  Pain: 2 Response to Treatment: Procedure was tolerated well Level of Consciousness (Post- Awake and Alert procedure): Post Debridement Measurements of Total Wound Length: (cm) 1.7 Width: (cm) 1 Depth: (cm) 0.2 Volume: (cm) 0.267 Character of Wound/Ulcer Post Debridement: Improved Severity of Tissue Post Debridement: Fat layer exposed Post Procedure Diagnosis Same as Pre-procedure Electronic Signature(s) Signed: 04/03/2020 5:21:32 PM By: Levan Hurst RN, BSN Signed: 04/03/2020 6:02:08 PM By: Worthy Keeler PA-C Entered By: Levan Hurst on 04/03/2020 08:36:53 -------------------------------------------------------------------------------- Debridement Details Patient Name: Date of Service: [place], [place] 04/03/2020 8:00 A M Medical Record Number: [id number] Patient Account Number: [id number] Date of Birth/Sex: Treating RN: 09/08/1940 (79 y.o. [name] Provider: [name]., [name] Other Clinician: Referring Provider: Treating Provider/Extender: Gareth Morgan., [name] in Treatment: 25 Debridement Performed for Assessment: Wound #4 Right,Distal,Lateral Lower Leg Performed By: Physician Worthy Keeler, PA Debridement Type: Debridement Severity of Tissue Pre Debridement: Fat layer exposed Level of Consciousness (Pre-procedure): Awake and Alert Pre-procedure Verification/Time Out Yes - 08:35 Taken: Start Time: 08:35 T Area Debrided (L x W): otal 1.6 (cm) x 1.5 (cm) = 2.4 (cm) Tissue and other material debrided: Viable, Non-Viable, Slough, Subcutaneous, Biofilm, Slough Level: Skin/Subcutaneous Tissue Debridement Description: Excisional Instrument: Curette Bleeding: Minimum Hemostasis Achieved: Pressure End Time: 08:36 Procedural Pain: 4 Post Procedural Pain: 2 Response to Treatment: Procedure was tolerated well Level of Consciousness (Post- Awake and Alert procedure): Post Debridement Measurements of Total Wound Length:  (cm) 1.6 Width: (cm) 1.5 Depth: (cm) 0.2 Volume: (cm) 0.377 Character of Wound/Ulcer Post Debridement: Improved Severity of Tissue Post Debridement: Fat layer exposed Post Procedure Diagnosis Same as Pre-procedure Electronic Signature(s) Signed: 04/03/2020 5:21:32 PM By: Levan Hurst RN, BSN Signed: 04/03/2020 6:02:08 PM By: Worthy Keeler PA-C Entered By: Levan Hurst on 04/03/2020 08:37:22 -------------------------------------------------------------------------------- Debridement Details Patient Name: Date of Service: [place], [place]  04/03/2020 8:00 A M Medical Record Number: [id number] Patient Account Number: [id number] Date of Birth/Sex: Treating RN: 1941/08/26 (79 y.o. [name] Provider: [name]., [name] Other Clinician: Referring Provider: Treating Provider/Extender: Gareth Morgan., [name] in Treatment: 25 Debridement Performed for Assessment: Wound #6 Left,Proximal,Lateral Lower Leg Performed By: Physician Worthy Keeler, PA Debridement Type: Debridement Severity of Tissue Pre Debridement: Fat layer exposed Level of Consciousness (Pre-procedure): Awake and Alert Pre-procedure Verification/Time Out Yes - 08:35 Taken: Start Time: 08:35 T Area Debrided (L x W): otal 1.8 (cm) x 1 (cm) = 1.8 (cm) Tissue and other material debrided: Viable, Non-Viable, Slough, Subcutaneous, Biofilm, Slough Level: Skin/Subcutaneous Tissue Debridement Description: Excisional Instrument: Curette Bleeding: Minimum Hemostasis Achieved: Pressure End Time: 08:36 Procedural Pain: 4 Post Procedural Pain: 2 Response to Treatment: Procedure was tolerated well Level of Consciousness (Post- Awake and Alert procedure): Post Debridement Measurements of Total Wound Length: (cm) 1.8 Width: (cm) 1 Depth: (cm) 0.2 Volume: (cm) 0.283 Character of Wound/Ulcer Post Debridement: Improved Severity of Tissue Post Debridement: Fat layer exposed Post  Procedure Diagnosis Same as Pre-procedure Electronic Signature(s) Signed: 04/03/2020 5:21:32 PM By: Levan Hurst RN, BSN Signed: 04/03/2020 6:02:08 PM By: Worthy Keeler PA-C Entered By: Levan Hurst on 04/03/2020 08:37:50 -------------------------------------------------------------------------------- HPI Details Patient Name: Date of Service: [place], [place] 04/03/2020 8:00 A M Medical Record Number: [id number] Patient Account Number: [id number] Date of Birth/Sex: Treating RN: [birth year] (79 y.o. [name] Provider: [name]., [name] Other Clinician: Referring Provider: Treating Provider/Extender: Gareth Morgan., [name] in Treatment: 25 History of Present Illness HPI Description: ADMISSION 10/11/2019 This is a 79 year old man with a history of a severe cardiomyopathy with an ejection fraction of about 20%, chronic renal failure stage III. He is listed as a type II diabetic in epic although the patient denies this. He also has a history of PVD. He states for the last month he has had wounds on his bilateral lower extremities that started off as blisters which denuded. He has areas on the left lateral calf and 2 on the right lateral. He has an area on the left first met head which he did not know was there he we identified this on intake. He has been using Silvadene cream provided by his primary care physician but he is complaining that this burns. 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 He was acutely ill at that time complicating GI bleed. ABIs in our clinic were 1.16 on the right and  1.13 on the left 2/25; the patient comes in with his areas on the left lateral and right lateral calf. There is also an area over the left first MTP bunion deformity. We have been using Sorbact. His edema control is fairly good 3/4; left lateral and right lateral calf. Most of his wounds are in the same position tightly adherent nonviable debris. On the right we debrided the superior wound on the left both wounds. The area on his bunion over the left first MTP medially is I think just about closed. We have been using Sorbact without  a lot of success changed to Iodoflex under compression 3/11; left lateral and right lateral calf perhaps minor improvement in the surface condition. We have been using Iodoflex. The area over the bunion of the left first MTP has closed over 3/18; left lateral and right lateral calf not much improvement. We have been using Iodoflex. Aggressive debridement last week. 3/25; left lateral and right lateral calf. We have been using Iodoflex. There is some improvement in the superior area on the right and 2 on the lateral left although there is still a lot of debris on the surface. The inferior area on the right is still a completely nonviable surface. 4/8; left lateral and right lateral calfs. Essentially mirror-image looking wounds 2 wounds on each side in close juxtaposition we have been using Iodoflex with some improvement in the very adherent fibrinous debris but not a lot. The patient has arterial studies next Wednesday morning and venous studies next Thursday morning. I have been avoiding any further aggressive debridement until we see the arterial study results. His ABIs were fairly good in this clinic and is dorsalis pedis pulses are palpable but the wound beds are pale. 4/15; left lateral and right lateral calfs. Essentially mirror-image wounds with 2 wounds on each side in close juxtaposition but with rims of separating normal tissue. We have been using Iodoflex. The  base of the wounds has been cleaning up quite nicely ARTERIAL STUDIES were done showing the patient had an ABI on the right of 1.27 with triphasic waveforms and a TBI of 0.90 on the left triphasic waveforms with an ABI of 1.28 and a TBI of 0.87. No evidence of arterial disease VENOUS REFLUX STUDIES; were done yesterday we do not have this report yet. 4/23; VENOUS REFLUX STUDIES did not show any significant reflux right lower extremity. No evidence of a DVT He did have significant reflux in the common . femoral vein on the left but nothing else was listed as significant. He did not have a DVT. We have been using Iodoflex under compression his wounds are making progress. 4/29; improvements in the wound surface continue. We have been using Iodoflex. He has a 20% out-of-pocket co-pay for Apligraf which is unfortunate. We changed him to Sorbact today 5/6; started on Sorbact last week. Better looking wound surface but not much change in dimensions. 01/24/20-Patient is back at 3 weeks, wound surfaces are about the same but very minimal slough on the right leg wounds, however there is blistering on both legs adjacent to the wounds, patient denies any other symptoms or new symptoms. His studies have been reviewed and do not indicate any significant arterial or venous disease. But he is attending once in 3 weeks with home health changing in between 6/3; patient has 4 wounds 2 on each side of his lateral lower legs. These wounds are somewhat improved. He apparently arrived in clinic last week with a large blister on the right lateral lower leg that had denuded into the wound. They changed to silver alginate. Still under compression. He has straight Medicare and unfortunately has an unlimited co-pay for advanced treatment options. 6/10; his original 4 wounds are all better especially on the left lateral lower leg. Areas on the right medial have a better looking surface were using silver collagen The blister  on the right lateral lower leg from last week has an open area however this looks fairly healthy were using silver alginate on this He comes in today having "stubbed" his left second toe he  has an abrasion at the base of the nailbed I think he probably caught the nail which is mycotic. 6/24; blister on the right lateral lower leg has healed. There is no additional wounds. The traumatic left second toe is also closed. The 4 that he has are all better 7/1; all of the patient's wounds are smaller except for the proximal one on the left lateral calf. We are using silver collagen 7/22; patient has home health. Relatively refractory wounds on the lateral part of both mid calfs. Each of them 2 wounds. The areas on the left are doing much better in fact the distal 1 looks like it is on his way to closure. The right required debridement with adherent fibrinous debris under illumination. We have been using silver collagen. Previous arterial studies were within normal limits he also had venous reflux studies 04/03/2020 upon evaluation today patient appears to be doing excellent with regard to his lower extremity ulcers. He is going to require a little bit of debridement on the wounds on the right especially in order to clear away some biofilm and slough but this is minimal and overall he seems to be doing quite well. Electronic Signature(s) Signed: 04/03/2020 8:39:37 AM By: Worthy Keeler PA-C Entered By: Worthy Keeler on 04/03/2020 08:39:36 -------------------------------------------------------------------------------- Physical Exam Details Patient Name: Date of Service: [place], [place] 04/03/2020 8:00 A M Medical Record Number: [id number] Patient Account Number: [id number] Date of Birth/Sex: Treating RN: [birth year] (79 y.o. [name] Provider: [name]., [name] Other Clinician: Referring Provider: Treating Provider/Extender: Gareth Morgan., [name] in  Treatment: 31 Constitutional Well-nourished and well-hydrated in no acute distress. Respiratory normal breathing without difficulty. Psychiatric this patient is able to make decisions and demonstrates good insight into disease process. Alert and Oriented x 3. pleasant and cooperative. Notes Upon inspection patient's wound bed actually showed signs of good granulation at this time there does not appear to be any evidence of infection there was some biofilm and slough on the 2 wounds on the right and one on the left that did require sharp debridement today post debridement wound beds appear to be doing much better which is great news. There is no signs of active infection at this time. Electronic Signature(s) Signed: 04/03/2020 8:39:58 AM By: Worthy Keeler PA-C Entered By: Worthy Keeler on 04/03/2020 08:39:57 -------------------------------------------------------------------------------- Physician Orders Details Patient Name: Date of Service: [name] [employer] [place] 04/03/2020 8:00 [place] Record Number: [id number] Patient Account Number: [id number] Date of Birth/Sex: Treating RN: 11/10/1940 (79 y.o. [name] Provider: [name]., [name] Other Clinician: Referring Provider: Treating Provider/Extender: Gareth Morgan., [name] in Treatment: 25 Verbal / Phone Orders: No Diagnosis Coding ICD-10 Coding Code Description [phone] Chronic venous hypertension (idiopathic) with inflammation of bilateral lower extremity I89.0 Lymphedema, not elsewhere classified L97.822 Non-pressure chronic ulcer of other part of left lower leg with fat layer exposed L97.812 Non-pressure chronic ulcer of other part of right lower leg with fat layer exposed S90.812D Abrasion, left foot, subsequent encounter Follow-up Appointments Return Appointment in 2 weeks. Dressing Change Frequency Other: - all wounds - change twice a week by home health Skin  Barriers/Peri-Wound Care Moisturizing lotion Wound Cleansing May shower with protection. - use cast protectors on the days dressings are not changed. May shower and wash wound with soap and water. - with dressing changes only.  Primary Wound Dressing Wound #1 Left,Lateral Lower Leg Silver Collagen - moisten with hydrogel Wound #3 Right,Proximal,Lateral Lower Leg Silver Collagen - moisten with hydrogel Wound #4 Right,Distal,Lateral Lower Leg Silver Collagen - moisten with hydrogel Wound #6 Left,Proximal,Lateral Lower Leg Silver Collagen - moisten with hydrogel Secondary Dressing Dry Gauze - all wounds ABD pad - all wounds Other: - pad left medial first met head for protection. Edema Control 4 layer compression - Bilateral Avoid standing for long periods of time Elevate legs to the level of the heart or above for 30 minutes daily and/or when sitting, a frequency of: - throughout the day. [facility] skilled [facility] for wound care. [facility] Electronic Signature(s) Signed: 04/03/2020 5:21:32 PM By: Levan Hurst RN, BSN Signed: 04/03/2020 6:02:08 PM By: Worthy Keeler PA-C Entered By: Levan Hurst on 04/03/2020 08:41:58 -------------------------------------------------------------------------------- Problem List Details Patient Name: Date of Service: [place], [place] 04/03/2020 8:00 [place] Number: [id number] Patient Account Number: [id number] Date of Birth/Sex: Treating RN: 08/20/1941 (79 y.o. [name] Provider: [name]., [name] Other Clinician: Referring Provider: Treating Provider/Extender: Gareth Morgan., [name] in Treatment: 25 Active Problems ICD-10 Encounter Code Description Active Date MDM Diagnosis I87.323 Chronic venous hypertension (idiopathic) with inflammation of bilateral lower 10/11/2019 No Yes extremity I89.0 Lymphedema, not elsewhere classified 10/11/2019 No  Yes L97.822 Non-pressure chronic ulcer of other part of left lower leg with fat layer exposed2/06/2020 No Yes L97.812 Non-pressure chronic ulcer of other part of right lower leg with fat layer 01/03/2020 No Yes exposed S90.812D Abrasion, left foot, subsequent encounter 02/07/2020 No Yes Inactive Problems ICD-10 Code Description Active Date Inactive Date L97.521 Non-pressure chronic ulcer of other part of left foot limited to breakdown of skin 10/11/2019 10/11/2019 Resolved Problems ICD-10 Code Description Active Date Resolved Date 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 10/11/2019 10/11/2019 Electronic Signature(s) Signed: 04/03/2020 6:02:08 PM By: Worthy Keeler PA-C Entered By: Worthy Keeler on 04/03/2020 07:56:43 -------------------------------------------------------------------------------- Progress Note Details Patient Name: Date of Service: [place] 04/03/2020 8:00 [place] Record Number: [id number] Patient Account Number: [id number] Date of Birth/Sex: Treating RN: 06/06/1941 (79 y.o. [name] Provider: [name]., [name] Other Clinician: Referring Provider: Treating Provider/Extender: Gareth Morgan., [name] in Treatment: 25 Subjective Chief Complaint Information obtained from Patient 10/11/2019; patient is here for review of wounds on his bilateral lower extremities and left foot History of Present Illness (HPI) ADMISSION 10/11/2019 This is a 79 year old man with a history of a severe cardiomyopathy with an ejection fraction of about 20%, chronic renal failure stage III. He is listed as a type II diabetic in epic although the patient denies this. He also has a history of PVD. He states for the last month he has had wounds on his bilateral lower extremities that started off as blisters which denuded. He has areas on the left lateral calf and 2 on the right lateral. He has an area on the left  first met head which he did not know was there he we identified this on intake. He has been using Silvadene cream provided by his primary care physician but he is complaining that this burns. Past medical history; acute on chronic congestive heart failure with a severe cardiomyopathy, history of hypoalbuminemia with an albumin of 1.9 in November, on chronic  Coumadin at this point for reasons that are not totally clear, listed as a type II diabetic although the patient denies this, chronic kidney disease stage III, cholangitis with an acute hospital admission from 10/19 through 07/08/2019. He was acutely ill at that time complicating GI bleed. ABIs in our clinic were 1.16 on the right and 1.13 on the left 2/25; the patient comes in with his areas on the left lateral and right lateral calf. There is also an area over the left first MTP bunion deformity. We have been using Sorbact. His edema control is fairly good 3/4; left lateral and right lateral calf. Most of his wounds are in the same position tightly adherent nonviable debris. On the right we debrided the superior wound on the left both wounds. The area on his bunion over the left first MTP medially is I think just about closed. We have been using Sorbact without a lot of success changed to Iodoflex under compression 3/11; left lateral and right lateral calf perhaps minor improvement in the surface condition. We have been using Iodoflex. The area over the bunion of the left first MTP has closed over 3/18; left lateral and right lateral calf not much improvement. We have been using Iodoflex. Aggressive debridement last week. 3/25; left lateral and right lateral calf. We have been using Iodoflex. There is some improvement in the superior area on the right and 2 on the lateral left although there is still a lot of debris on the surface. The inferior area on the right is still a completely nonviable surface. 4/8; left lateral and right lateral calfs.  Essentially mirror-image looking wounds 2 wounds on each side in close juxtaposition we have been using Iodoflex with some improvement in the very adherent fibrinous debris but not a lot. The patient has arterial studies next Wednesday morning and venous studies next Thursday morning. I have been avoiding any further aggressive debridement until we see the arterial study results. His ABIs were fairly good in this clinic and is dorsalis pedis pulses are palpable but the wound beds are pale. 4/15; left lateral and right lateral calfs. Essentially mirror-image wounds with 2 wounds on each side in close juxtaposition but with rims of separating normal tissue. We have been using Iodoflex. The base of the wounds has been cleaning up quite nicely ARTERIAL STUDIES were done showing the patient had an ABI on the right of 1.27 with triphasic waveforms and a TBI of 0.90 on the left triphasic waveforms with an ABI of 1.28 and a TBI of 0.87. No evidence of arterial disease VENOUS REFLUX STUDIES; were done yesterday we do not have this report yet. 4/23; VENOUS REFLUX STUDIES did not show any significant reflux right lower extremity. No evidence of a DVT He did have significant reflux in the common . femoral vein on the left but nothing else was listed as significant. He did not have a DVT. We have been using Iodoflex under compression his wounds are making progress. 4/29; improvements in the wound surface continue. We have been using Iodoflex. He has a 20% out-of-pocket co-pay for Apligraf which is unfortunate. We changed him to Sorbact today 5/6; started on Sorbact last week. Better looking wound surface but not much change in dimensions. 01/24/20-Patient is back at 3 weeks, wound surfaces are about the same but very minimal slough on the right leg wounds, however there is blistering on both legs adjacent to the wounds, patient denies any other symptoms or new symptoms. His studies have been  reviewed and do not  indicate any significant arterial or venous disease. But he is attending once in 3 weeks with home health changing in between 6/3; patient has 4 wounds 2 on each side of his lateral lower legs. These wounds are somewhat improved. He apparently arrived in clinic last week with a large blister on the right lateral lower leg that had denuded into the wound. They changed to silver alginate. Still under compression. He has straight Medicare and unfortunately has an unlimited co-pay for advanced treatment options. 6/10; his original 4 wounds are all better especially on the left lateral lower leg. 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 6/24; blister on the right lateral lower leg has healed. There is no additional wounds. The traumatic left second toe is also closed. The 4 that he has are all better 7/1; all of the patient's wounds are smaller except for the proximal one on the left lateral calf. We are using silver collagen 7/22; patient has home health. Relatively refractory wounds on the lateral part of both mid calfs. Each of them 2 wounds. The areas on the left are doing much better in fact the distal 1 looks like it is on his way to closure. The right required debridement with adherent fibrinous debris under illumination. We have been using silver collagen. Previous arterial studies were within normal limits he also had venous reflux studies 04/03/2020 upon evaluation today patient appears to be doing excellent with regard to his lower extremity ulcers. He is going to require a little bit of debridement on the wounds on the right especially in order to clear  away some biofilm and slough but this is minimal and overall he seems to be doing quite well. Objective Constitutional Well-nourished and well-hydrated in no acute distress. Vitals Time Taken: 8:00 AM, Height: 71 in, Weight: 198 lbs, BMI: 27.6, Temperature: 97.6 F, Pulse: 89 bpm, Respiratory Rate: 19 breaths/min, Blood Pressure: 116/75 mmHg. Respiratory normal breathing without difficulty. Psychiatric this patient is able to make decisions and demonstrates good insight into disease process. Alert and Oriented x 3. pleasant and cooperative. General Notes: Upon inspection patient's wound bed actually showed signs of good granulation at this time there does not appear to be any evidence of infection there was some biofilm and slough on the 2 wounds on the right and one on the left that did require sharp debridement today post debridement wound beds appear to be doing much better which is great news. There is no signs of active infection at this time. Integumentary (Hair, Skin) Wound #1 status is Open. Original cause of wound was Blister. The wound is located on the Left,Lateral Lower Leg. The wound measures 1.4cm length x 0.9cm width x 0.2cm depth; 0.99cm^2 area and 0.198cm^3 volume. There is Fat Layer (Subcutaneous Tissue) Exposed exposed. There is no tunneling or undermining noted. There is a medium amount of serosanguineous drainage noted. The wound margin is distinct with the outline attached to the wound base. There is medium (34-66%) red granulation within the wound bed. There is a medium (34-66%) amount of necrotic tissue within the wound bed including Adherent Slough. Wound #  3 status is Open. Original cause of wound was Blister. The wound is located on the Right,Proximal,Lateral Lower Leg. The wound measures 1.7cm length x 1cm width x 0.2cm depth; 1.335cm^2 area and 0.267cm^3 volume. There is Fat Layer (Subcutaneous Tissue) Exposed exposed. There is no tunneling or undermining noted. There  is a medium amount of serosanguineous drainage noted. The wound margin is distinct with the outline attached to the wound base. There is medium (34-66%) red granulation within the wound bed. There is a medium (34-66%) amount of necrotic tissue within the wound bed including Adherent Slough. Wound #4 status is Open. Original cause of wound was Blister. The wound is located on the Right,Distal,Lateral Lower Leg. The wound measures 1.6cm length x 1.5cm width x 0.2cm depth; 1.885cm^2 area and 0.377cm^3 volume. There is Fat Layer (Subcutaneous Tissue) Exposed exposed. There is no tunneling or undermining noted. There is a medium amount of serosanguineous drainage noted. The wound margin is distinct with the outline attached to the wound base. There is medium (34-66%) red granulation within the wound bed. There is a medium (34-66%) amount of necrotic tissue within the wound bed including Adherent Slough. Wound #6 status is Open. Original cause of wound was Gradually Appeared. The wound is located on the Left,Proximal,Lateral Lower Leg. The wound measures 1.8cm length x 1cm width x 0.2cm depth; 1.414cm^2 area and 0.283cm^3 volume. There is Fat Layer (Subcutaneous Tissue) Exposed exposed. There is no tunneling or undermining noted. There is a medium amount of serosanguineous drainage noted. The wound margin is distinct with the outline attached to the wound base. There is medium (34-66%) red granulation within the wound bed. There is a medium (34-66%) amount of necrotic tissue within the wound bed including Adherent Slough. Assessment Active Problems ICD-10 Chronic venous hypertension (idiopathic) with inflammation of bilateral lower extremity Lymphedema, not elsewhere classified Non-pressure chronic ulcer of other part of left lower leg with fat layer exposed Non-pressure chronic ulcer of other part of right lower leg with fat layer exposed Abrasion, left foot, subsequent encounter Procedures Wound  #3 Pre-procedure diagnosis of Wound #3 is a Venous Leg Ulcer located on the Right,Proximal,Lateral Lower Leg .Severity of Tissue Pre Debridement is: Fat layer exposed. There was a Excisional Skin/Subcutaneous Tissue Debridement with a total area of 1.7 sq cm performed by Worthy Keeler, PA. With the following instrument(s): Curette to remove Viable and Non-Viable tissue/material. Material removed includes Subcutaneous Tissue, Slough, and Biofilm. No specimens were taken. A time out was conducted at 08:35, prior to the start of the procedure. A Minimum amount of bleeding was controlled with Pressure. The procedure was tolerated well with a pain level of 4 throughout and a pain level of 2 following the procedure. Post Debridement Measurements: 1.7cm length x 1cm width x 0.2cm depth; 0.267cm^3 volume. Character of Wound/Ulcer Post Debridement is improved. Severity of Tissue Post Debridement is: Fat layer exposed. 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 There was a Four Layer Compression Therapy Procedure by Levan Hurst, RN. Post procedure Diagnosis Wound #3: Same as Pre-Procedure Wound #4 Pre-procedure diagnosis of Wound #4 is a Venous Leg Ulcer located on the Right,Distal,Lateral Lower Leg .Severity of Tissue Pre Debridement is: Fat layer exposed. There was a Excisional Skin/Subcutaneous Tissue Debridement with a total area of 2.4 sq cm performed by Worthy Keeler, PA. With the following instrument(s): Curette to remove Viable and Non-Viable tissue/material. Material  removed includes Subcutaneous Tissue, Slough, and Biofilm. No specimens were taken. A time out was conducted at 08:35, prior to the start of the procedure. A Minimum amount of bleeding was controlled with Pressure. The procedure was tolerated well with a pain level of 4 throughout and a pain level of 2  following the procedure. Post Debridement Measurements: 1.6cm length x 1.5cm width x 0.2cm depth; 0.377cm^3 volume. Character of Wound/Ulcer Post Debridement is improved. Severity of Tissue Post Debridement is: Fat layer exposed. 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 There was a Four Layer Compression Therapy Procedure by Levan Hurst, RN. Post procedure Diagnosis Wound #4: Same as Pre-Procedure Wound #6 Pre-procedure diagnosis of Wound #6 is a Venous Leg Ulcer located on the Left,Proximal,Lateral Lower Leg .Severity of Tissue Pre Debridement is: Fat layer exposed. There was a Excisional Skin/Subcutaneous Tissue Debridement with a total area of 1.8 sq cm performed by Worthy Keeler, PA. With the following instrument(s): Curette to remove Viable and Non-Viable tissue/material. Material removed includes Subcutaneous Tissue, Slough, and Biofilm. No specimens were taken. A time out was conducted at 08:35, prior to the start of the procedure. A Minimum amount of bleeding was controlled with Pressure. The procedure was tolerated well with a pain level of 4 throughout and a pain level of 2 following the procedure. Post Debridement Measurements: 1.8cm length x 1cm width x 0.2cm depth; 0.283cm^3 volume. Character of Wound/Ulcer Post Debridement is improved. Severity of Tissue Post Debridement is: Fat layer exposed. Post procedure Diagnosis Wound #6: Same as Pre-Procedure Pre-procedure diagnosis of Wound #6 is a Venous Leg Ulcer located on the Left,Proximal,Lateral Lower Leg . There was a Four Layer Compression Therapy Procedure by Levan Hurst, RN. Post procedure Diagnosis Wound #6: Same as Pre-Procedure Wound #1 Pre-procedure diagnosis of Wound #1 is a Venous Leg Ulcer located on the Left,Lateral Lower Leg . There was a Four Layer Compression Therapy Procedure by Levan Hurst, RN. Post procedure Diagnosis Wound #1: Same as Pre-Procedure Plan Follow-up Appointments: Return Appointment in 2 weeks. Dressing Change Frequency: Other: - all wounds - change twice a week by home health Skin Barriers/Peri-Wound Care: Moisturizing lotion Wound Cleansing: May shower with protection. - use cast protectors on the days dressings are not changed. May shower and wash wound with soap and water. - with dressing changes only. Primary Wound Dressing: Wound #1 Left,Lateral Lower Leg: Silver Collagen - moisten with hydrogel Wound #3 Right,Proximal,Lateral Lower Leg: Silver Collagen - moisten with hydrogel Wound #4 Right,Distal,Lateral Lower Leg: Silver Collagen - moisten with hydrogel Wound #6 Left,Proximal,Lateral Lower Leg: Silver Collagen - moisten with hydrogel Secondary Dressing: Dry Gauze - all wounds ABD pad - all wounds Other: - pad left medial first met head for protection. Edema Control: 4 layer compression - Bilateral - ensure to wrap from foot to just below calf. Avoid standing for long periods of time Elevate legs to the level of the heart or above for 30 minutes daily and/or when sitting, a frequency of: - throughout the day. Home Health: [facility] for wound care. - [facility] 1. I would recommend currently that we go ahead and continue with the wound care measures as before the patient is in agreement with the plan and we will subsequently therefore continue with the collagen that does seem to be improving the overall growth and surface of the wound. 2. I  am also can recommend that the patient continue with the 4 layer compression wrap that does seem to be keeping his edema under good control. 3. I would also recommend he continue as much as possible elevate his legs and keep ambulating as much as possible. We will see patient back for reevaluation in 2 weeks here in the clinic. If anything worsens or changes  patient will contact our office for additional recommendations. Electronic Signature(s) Signed: 04/03/2020 8:40:57 AM By: Worthy Keeler PA-C Entered By: Worthy Keeler on 04/03/2020 08:40:57 -------------------------------------------------------------------------------- SuperBill Details Patient Name: Date of Service: [place], [place] 04/03/2020 Medical Record Number: [id number] Patient Account Number: [id number] Date of Birth/Sex: Treating RN: 08/31/1940 (79 y.o. [name] Provider: [name]., [name] Other Clinician: Referring Provider: Treating Provider/Extender: Gareth Morgan., [name] in Treatment: 25 Diagnosis Coding ICD-10 Codes Code Description [phone] Chronic venous hypertension (idiopathic) with inflammation of bilateral lower extremity I89.0 Lymphedema, not elsewhere classified L97.822 Non-pressure chronic ulcer of other part of left lower leg with fat layer exposed L97.812 Non-pressure chronic ulcer of other part of right lower leg with fat layer exposed S90.812D Abrasion, left foot, subsequent encounter Facility Procedures The patient participates with Medicare or their insurance follows the Medicare Facility Guidelines: CPT4 Code Description Modifier Quantity [id number] - DEB SUBQ TISSUE 20 SQ CM/< 1 ICD-10 Diagnosis Description L97.822 Non-pressure chronic ulcer of  other part of left lower leg with fat layer exposed L97.812 Non-pressure chronic ulcer of other part of right lower leg with fat layer exposed Physician Procedures : CPT4 Code Description Modifier [id number] - WC PHYS SUBQ TISS 20 SQ CM ICD-10 Diagnosis Description L97.822 Non-pressure chronic ulcer of other part of left lower leg with fat layer exposed L97.812 Non-pressure chronic ulcer of other part of right  lower leg with fat layer exposed Quantity: 1 Electronic Signature(s) Signed: 04/03/2020 8:41:49 AM By: Worthy Keeler PA-C Entered By: Worthy Keeler on 04/03/2020 08:41:48

## 2020-04-04 NOTE — Progress Notes (Signed)
[name], [name] ([id number]) Visit Report for 04/03/2020 Arrival Information Details Patient Name: Date of Service: [place], [place] 04/03/2020 8:00 [place] Record Number: [id number] Patient Account Number: [id number] Date of Birth/Sex: Treating RN: 09/07/1940 (79 y.o. [name] Primary Care [name]: [name]., [name] Other Clinician: Referring [name]: Treating [name]/Extender: Gareth Morgan., [name] in Treatment: 25 Visit Information History Since Last Visit Added or deleted any medications: No Patient Arrived: Cane Any new allergies or adverse reactions: No Arrival Time: 08:05 Had a fall or experienced change in No Accompanied By: self activities of daily living that may affect Transfer Assistance: None risk of falls: Patient Identification Verified: Yes Signs or symptoms of abuse/neglect since last visito No Secondary Verification Process Completed: Yes Hospitalized since last visit: No Patient Requires Transmission-Based Precautions: No Implantable device outside of the clinic excluding No Patient Has Alerts: Yes cellular tissue based products placed in the center Patient Alerts: Patient on Blood Thinner since last visit: Has Dressing in Place as Prescribed: Yes Has Compression in Place as Prescribed: Yes Pain Present Now: No Electronic Signature(s) Signed: 04/03/2020 4:55:47 PM By: Kela Millin Entered By: Kela Millin on 04/03/2020 08:06:12 -------------------------------------------------------------------------------- Compression Therapy Details Patient Name: Date of Service: [place] 04/03/2020 8:00 [place] Record Number: [id number] Patient Account Number: [id number] Date of Birth/Sex: Treating RN: 24-Mar-1941 (79 y.o. [name] Primary Care Sian Joles: [name]., [name] Other Clinician: Referring Carless Slatten: Treating [name]/Extender: Gareth Morgan., [name] in  Treatment: 25 Compression Therapy Performed for Wound Assessment: Wound #1 Left,Lateral Lower Leg Performed By: Clinician Levan Hurst, RN Compression Type: Four Layer Post Procedure Diagnosis Same as Pre-procedure Electronic Signature(s) Signed: 04/03/2020 5:21:32 PM By: Levan Hurst RN, BSN Entered By: Levan Hurst on 04/03/2020 08:38:04 -------------------------------------------------------------------------------- Compression Therapy Details Patient Name: Date of Service: [place], [place] 04/03/2020 8:00 [place] Record Number: [id number] Patient Account Number: [id number] Date of Birth/Sex: Treating RN: Mar 28, 1941 (79 y.o. [name] Primary Care Zackarey Holleman: [name]., [name] Other Clinician: Referring Rasheda Ledger: Treating [name]/Extender: Gareth Morgan., [name] in Treatment: 25 Compression Therapy Performed for Wound Assessment: Wound #3 Right,Proximal,Lateral Lower Leg Performed By: Clinician Levan Hurst, RN Compression Type: Four Layer Post Procedure Diagnosis Same as Pre-procedure Electronic Signature(s) Signed: 04/03/2020 5:21:32 PM By: Levan Hurst RN, BSN Entered By: Levan Hurst on 04/03/2020 08:38:04 -------------------------------------------------------------------------------- Compression Therapy Details Patient Name: Date of Service: [place], [place] 04/03/2020 8:00 [place] Record Number: [id number] Patient Account Number: [id number] Date of Birth/Sex: Treating RN: 1941-06-12 (79 y.o. [name] Primary Care Lindora Alviar: [name]., [name] Other Clinician: Referring Barack Nicodemus: Treating [name]/Extender: Gareth Morgan., [name] in Treatment: 25 Compression Therapy Performed for Wound Assessment: Wound #4 Right,Distal,Lateral Lower Leg Performed By: Clinician Levan Hurst, RN Compression Type: Four Layer Post Procedure Diagnosis Same as Pre-procedure Electronic  Signature(s) Signed: 04/03/2020 5:21:32 PM By: Levan Hurst RN, BSN Entered By: Levan Hurst on 04/03/2020 08:38:04 -------------------------------------------------------------------------------- Compression Therapy Details Patient Name: Date of Service: [place], [place] 04/03/2020 8:00 [place] Record Number: [id number] Patient Account Number: [id number] Date of Birth/Sex: Treating RN: Mar 31, 1941 (79 y.o. [name] Primary Care Joab Carden: [name]., [name] Other Clinician: Referring [name]: Treating [name]/Extender: Gareth Morgan., [name] [place]  in Treatment: 25 Compression Therapy Performed for Wound Assessment: Wound #6 Left,Proximal,Lateral Lower Leg Performed By: Clinician Levan Hurst, RN Compression Type: Four Layer Post Procedure Diagnosis Same as Pre-procedure Electronic Signature(s) Signed: 04/03/2020 5:21:32 PM By: Levan Hurst RN, BSN Entered By: Levan Hurst on 04/03/2020 08:38:04 -------------------------------------------------------------------------------- Encounter Discharge Information Details Patient Name: Date of Service: [place], [place] 04/03/2020 8:00 [place] Number: [id number] Patient Account Number: [id number] Date of Birth/Sex: Treating RN: 18-Feb-1941 (78 y.o. [name]) [name] Primary Care Jahson Emanuele: [name]., [name] Other Clinician: Referring [name]: Treating [name]/Extender: Gareth Morgan., [name] in Treatment: 25 Encounter Discharge Information Items Post Procedure Vitals Discharge Condition: Stable Temperature (F): 97.6 Ambulatory Status: Cane Pulse (bpm): 89 Discharge Destination: Home Respiratory Rate (breaths/min): 19 Transportation: Private Auto Blood Pressure (mmHg): 116/75 Accompanied By: self Schedule Follow-up Appointment: Yes Clinical Summary of Care: Patient Declined Electronic Signature(s) Signed: 04/04/2020 4:42:20 PM By: Troy Coria RN Entered  By: [name] on 04/03/2020 09:00:38 -------------------------------------------------------------------------------- Lower Extremity Assessment Details Patient Name: Date of Service: [place], [place] 04/03/2020 8:00 [place] Record Number: [id number] Patient Account Number: [id number] Date of Birth/Sex: Treating RN: 07-Oct-1940 (79 y.o. [name] Primary Care Shawnda Mauney: [name]., [name] Other Clinician: Referring Tilia Faso: Treating Kinslea Frances/Extender: Gareth Morgan., [name] in Treatment: 25 Edema Assessment Assessed: [Left: No] [Right: No] Edema: [Left: Yes] [Right: Yes] Calf Left: Right: Point of Measurement: 43 cm From Medial Instep 38 cm 36 cm Ankle Left: Right: Point of Measurement: 14 cm From Medial Instep 23.5 cm 24.5 cm Vascular Assessment Pulses: Dorsalis Pedis Palpable: [Left:No] [Right:No] Electronic Signature(s) Signed: 04/03/2020 4:55:47 PM By: Kela Millin Entered By: Kela Millin on 04/03/2020 08:13:31 -------------------------------------------------------------------------------- Multi-Disciplinary Care Plan Details Patient Name: Date of Service: [place] 04/03/2020 8:00 [place] [facility] Record Number: [id number] Patient Account Number: [id number] Date of Birth/Sex: Treating RN: 22-Feb-1941 (79 y.o. [name] Primary Care Maley Venezia: [name]., [name] Other Clinician: Referring Muna Demers: Treating Wilbur [place]/Extender: Gareth Morgan., [name] in Treatment: 25 Active Inactive Wound/Skin Impairment Nursing Diagnoses: Knowledge deficit related to ulceration/compromised skin integrity Goals: Patient/caregiver will verbalize understanding of skin care regimen Date Initiated: 10/11/2019 Target Resolution Date: 05/02/2020 Goal Status: Active Interventions: Assess patient/caregiver ability to obtain necessary supplies Assess patient/caregiver ability to perform ulcer/skin care  regimen upon admission and as needed Assess ulceration(s) every visit Provide education on ulcer and skin care Treatment Activities: Skin care regimen initiated : 10/11/2019 Topical wound management initiated : 10/11/2019 Notes: Electronic Signature(s) Signed: 04/03/2020 5:21:32 PM By: Levan Hurst RN, BSN Entered By: Levan Hurst on 04/03/2020 07:54:19 -------------------------------------------------------------------------------- Pain Assessment Details Patient Name: Date of Service: [name] 04/03/2020 8:00 [place] Record Number: [id number] Patient Account Number: [id number] Date of Birth/Sex: Treating RN: 23-Sep-1940 (79 y.o. [name] Primary Care Gurpreet Mariani: [name]., [name] Other Clinician: Referring Amarylis Rovito: Treating Emrah Ariola/Extender: Gareth Morgan., [name] in Treatment: 25 Active Problems Location of Pain Severity and Description of Pain Patient Has Paino No Site Locations Pain Management and Medication Current Pain Management: Electronic Signature(s) Signed: 04/03/2020 4:55:47 PM By: Kela Millin Entered By: Kela Millin on 04/03/2020 08:09:08 -------------------------------------------------------------------------------- Patient/Caregiver Education Details Patient Name: Date of Service: [place], [place] 8/5/2021andnbsp8:00 [place] Record Number: [id number] Patient Account Number: [id number]  Date of Birth/Gender: Treating RN: 03/18/1941 (79 y.o. [name] Primary Care Physician: [name]., [name] Other Clinician: Referring Physician: Treating Physician/Extender: Gareth Morgan., [name] in Treatment: 25 Education Assessment Education Provided To: Patient Education Topics Provided Wound/Skin Impairment: Methods: Explain/Verbal Responses: State content correctly Electronic Signature(s) Signed: 04/03/2020 5:21:32 PM By: Levan Hurst RN, BSN Entered By: Levan Hurst on 04/03/2020 07:54:29 -------------------------------------------------------------------------------- Wound Assessment Details Patient Name: Date of Service: [place] 04/03/2020 8:00 [place] Record Number: [id number] Patient Account Number: [id number] Date of Birth/Sex: Treating RN: Jun 09, 1941 (79 y.o. [name] Primary Care Lean Jaeger: [name]., [name] Other Clinician: Referring Uzziah Rigg: Treating Quaniyah Bugh/Extender: Gareth Morgan., [name] in Treatment: 25 Wound Status Wound Number: 1 Primary Venous Leg Ulcer Etiology: Wound Location: Left, Lateral Lower Leg Wound Open Wounding Event: Blister Status: Date Acquired: 09/11/2019 Comorbid Anemia, Congestive Heart Failure, Hypertension, Peripheral Weeks Of Treatment: 25 History: Venous Disease, End Stage Renal Disease Clustered Wound: Yes Photos Photo Uploaded By: Mikeal Hawthorne on 04/03/2020 13:20:50 Wound Measurements Length: (cm) 1.4 Width: (cm) 0.9 Depth: (cm) 0.2 Clustered Quantity: 1 Area: (cm) 0.99 Volume: (cm) 0.198 % Reduction in Area: 98.3% % Reduction in Volume: 96.6% Epithelialization: Small (1-33%) Tunneling: No Undermining: No Wound Description Classification: Full Thickness Without Exposed Support Structures Wound Margin: Distinct, outline attached Exudate Amount: Medium Exudate Type: Serosanguineous Exudate Color: red, brown Foul Odor After Cleansing: No Slough/Fibrino Yes Wound Bed Granulation Amount: Medium (34-66%) Exposed Structure Granulation Quality: Red Fascia Exposed: No Necrotic Amount: Medium (34-66%) Fat Layer (Subcutaneous Tissue) Exposed: Yes Necrotic Quality: Adherent Slough Tendon Exposed: No Muscle Exposed: No Joint Exposed: No Bone Exposed: No Treatment Notes Wound #1 (Left, Lateral Lower Leg) 1. Cleanse With Wound Cleanser Soap and water 3. Primary Dressing Applied Collegen AG Hydrogel or K-Y Jelly 4. Secondary  Dressing Dry Gauze 6. Support Layer Applied 4 layer compression wrap Electronic Signature(s) Signed: 04/03/2020 4:55:47 PM By: Kela Millin Entered By: Kela Millin on 04/03/2020 08:16:47 -------------------------------------------------------------------------------- Wound Assessment Details Patient Name: Date of Service: [place] [name] [place] 04/03/2020 8:00 [place] Record Number: [id number] Patient Account Number: [id number] Date of Birth/Sex: Treating RN: [birth year] (79 y.o. [name] Primary Care Amarylis Rovito: [name]., [name] Other Clinician: Referring Mieka Leaton: Treating Tasheema Perrone/Extender: Gareth Morgan., [name] in Treatment: 25 Wound Status Wound Number: 3 Primary Venous Leg Ulcer Etiology: Wound Location: Right, Proximal, Lateral Lower Leg Wound Open Wounding Event: Blister Status: Date Acquired: 09/11/2019 Comorbid Anemia, Congestive Heart Failure, Hypertension, Peripheral Weeks Of Treatment: 25 History: Venous Disease, End Stage Renal Disease Clustered Wound: No Photos Photo Uploaded By: Mikeal Hawthorne on 04/03/2020 13:21:30 Wound Measurements Length: (cm) 1.7 Width: (cm) 1 Depth: (cm) 0.2 Area: (cm) 1.335 Volume: (cm) 0.267 % Reduction in Area: 73% % Reduction in Volume: 46.1% Epithelialization: Small (1-33%) Tunneling: No Undermining: No Wound Description Classification: Full Thickness Without Exposed Support Structu Wound Margin: Distinct, outline attached Exudate Amount: Medium Exudate Type: Serosanguineous Exudate Color: red, brown res Foul Odor After Cleansing: No Slough/Fibrino Yes Wound Bed Granulation Amount: Medium (34-66%) Exposed Structure Granulation Quality: Red Fascia Exposed: No Necrotic Amount: Medium (34-66%) Fat Layer (Subcutaneous Tissue) Exposed: Yes Necrotic Quality: Adherent Slough Tendon Exposed: No Muscle Exposed: No Joint Exposed: No Bone Exposed: No Treatment Notes Wound #3  (Right, Proximal, Lateral Lower Leg) 1. Cleanse With Wound Cleanser Soap and water  3. Primary Dressing Applied Collegen AG Hydrogel or K-Y Jelly 4. Secondary Dressing Dry Gauze 6. Support Layer Applied 4 layer compression wrap Electronic Signature(s) Signed: 04/03/2020 4:55:47 PM By: Kela Millin Entered By: Kela Millin on 04/03/2020 08:17:16 -------------------------------------------------------------------------------- Wound Assessment Details Patient Name: Date of Service: [place], [place] 04/03/2020 8:00 [place] Record Number: [id number] Patient Account Number: [id number] Date of Birth/Sex: Treating RN: 09-07-1940 (79 y.o. [name] Primary Care [place] Mincey: [name]., [name] Other Clinician: Referring Jissel Slavens: Treating Adir Schicker/Extender: Gareth Morgan., [name] in Treatment: 25 Wound Status Wound Number: 4 Primary Venous Leg Ulcer Etiology: Wound Location: Right, Distal, Lateral Lower Leg Wound Open Wounding Event: Blister Status: Date Acquired: 09/11/2019 Comorbid Anemia, Congestive Heart Failure, Hypertension, Peripheral Weeks Of Treatment: 25 History: Venous Disease, End Stage Renal Disease Clustered Wound: No Photos Photo Uploaded By: Mikeal Hawthorne on 04/03/2020 13:21:31 Wound Measurements Length: (cm) 1.6 Width: (cm) 1.5 Depth: (cm) 0.2 Area: (cm) 1.885 Volume: (cm) 0.377 % Reduction in Area: 79.7% % Reduction in Volume: 59.5% Epithelialization: Small (1-33%) Tunneling: No Undermining: No Wound Description Classification: Full Thickness Without Exposed Support Structures Wound Margin: Distinct, outline attached Exudate Amount: Medium Exudate Type: Serosanguineous Exudate Color: red, brown Foul Odor After Cleansing: No Slough/Fibrino Yes Wound Bed Granulation Amount: Medium (34-66%) Exposed Structure Granulation Quality: Red Fascia Exposed: No Necrotic Amount: Medium (34-66%) Fat Layer  (Subcutaneous Tissue) Exposed: Yes Necrotic Quality: Adherent Slough Tendon Exposed: No Muscle Exposed: No Joint Exposed: No Bone Exposed: No Treatment Notes Wound #4 (Right, Distal, Lateral Lower Leg) 1. Cleanse With Wound Cleanser Soap and water 3. Primary Dressing Applied Collegen AG Hydrogel or K-Y Jelly 4. Secondary Dressing Dry Gauze 6. Support Layer Applied 4 layer compression wrap Electronic Signature(s) Signed: 04/03/2020 4:55:47 PM By: Kela Millin Entered By: Kela Millin on 04/03/2020 08:17:38 -------------------------------------------------------------------------------- Wound Assessment Details Patient Name: Date of Service: [place] [name] [place] 04/03/2020 8:00 [place] Record Number: [id number] Patient Account Number: [id number] Date of Birth/Sex: Treating RN: 02/17/1941 (79 y.o. [name] Primary Care Madalyn Legner: [name]., [name] Other Clinician: Referring Wylma Tatem: Treating Jniyah Dantuono/Extender: Gareth Morgan., [name] in Treatment: 25 Wound Status Wound Number: 6 Primary Venous Leg Ulcer Etiology: Wound Location: Left, Proximal, Lateral Lower Leg Wound Open Wounding Event: Gradually Appeared Status: Date Acquired: 12/13/2019 Comorbid Anemia, Congestive Heart Failure, Hypertension, Peripheral Weeks Of Treatment: 16 History: Venous Disease, End Stage Renal Disease Clustered Wound: No Photos Photo Uploaded By: Mikeal Hawthorne on 04/03/2020 13:21:05 Wound Measurements Length: (cm) 1.8 Width: (cm) 1 Depth: (cm) 0.2 Area: (cm) 1.414 Volume: (cm) 0.283 % Reduction in Area: 57.6% % Reduction in Volume: 71.7% Epithelialization: Small (1-33%) Tunneling: No Undermining: No Wound Description Classification: Full Thickness Without Exposed Support Structures Wound Margin: Distinct, outline attached Exudate Amount: Medium Exudate Type: Serosanguineous Exudate Color: red, brown Foul Odor After Cleansing:  No Slough/Fibrino Yes Wound Bed Granulation Amount: Medium (34-66%) Exposed Structure Granulation Quality: Red Fascia Exposed: No Necrotic Amount: Medium (34-66%) Fat Layer (Subcutaneous Tissue) Exposed: Yes Necrotic Quality: Adherent Slough Tendon Exposed: No Muscle Exposed: No Joint Exposed: No Bone Exposed: No Treatment Notes Wound #6 (Left, Proximal, Lateral Lower Leg) 1. Cleanse With Wound Cleanser Soap and water 3. Primary Dressing Applied Collegen AG Hydrogel or K-Y Jelly 4. Secondary Dressing Dry Gauze 6. Support Layer Applied 4 layer compression wrap Electronic Signature(s) Signed: 04/03/2020 4:55:47 PM By: Kela Millin Entered By: Verita Schneiders,  Larene [place] on 04/03/2020 08:18:02 -------------------------------------------------------------------------------- Vitals Details Patient Name: Date of Service: [place] 04/03/2020 8:00 [place] Record Number: [id number] Patient Account Number: [id number] Date of Birth/Sex: Treating RN: 03-11-1941 (79 y.o. [name] Primary Care Lizzet Hendley: [name]., [name] Other Clinician: Referring Lynsay Fesperman: Treating Lexxie Winberg/Extender: Gareth Morgan., [name] in Treatment: 25 Vital Signs Time Taken: 08:00 Temperature (F): 97.6 Height (in): 71 Pulse (bpm): 89 Weight (lbs): 198 Respiratory Rate (breaths/min): 19 Body Mass Index (BMI): 27.6 Blood Pressure (mmHg): 116/75 Reference Range: 80 - 120 mg / dl Electronic Signature(s) Signed: 04/03/2020 4:55:47 PM By: Kela Millin Entered By: Kela Millin on 04/03/2020 08:08:47

## 2020-04-17 ENCOUNTER — Encounter (HOSPITAL_BASED_OUTPATIENT_CLINIC_OR_DEPARTMENT_OTHER): Payer: Medicare Other | Admitting: Internal Medicine

## 2020-04-17 DIAGNOSIS — L97822 Non-pressure chronic ulcer of other part of left lower leg with fat layer exposed: Secondary | ICD-10-CM | POA: Diagnosis not present

## 2020-04-17 NOTE — Progress Notes (Signed)
STAVROS, CAIL ([id number]) Visit Report for 04/17/2020 Debridement Details Patient Name: Date of Service: [name] 04/17/2020 8:00 A M Medical Record Number: [id number] Patient Account Number: [id number] Date of Birth/Sex: Treating RN: May 27, 1941 (79 y.o. [name] Provider: [name]., [name] Other Clinician: Referring Provider: Treating Provider/Extender: [name], [name] in Treatment: 27 Debridement Performed for Assessment: Wound #3 Right,Proximal,Lateral Lower Leg Performed By: Physician Ricard Dillon., MD Debridement Type: Debridement Severity of Tissue Pre Debridement: Fat layer exposed Level of Consciousness (Pre-procedure): Awake and Alert Pre-procedure Verification/Time Out Yes - 08:55 Taken: Start Time: 08:55 T Area Debrided (L x W): otal 1.7 (cm) x 0.9 (cm) = 1.53 (cm) Tissue and other material debrided: Viable, Non-Viable, Slough, Subcutaneous, Slough Level: Skin/Subcutaneous Tissue Debridement Description: Excisional Instrument: Curette Bleeding: Minimum Hemostasis Achieved: Pressure End Time: 08:56 Procedural Pain: 3 Post Procedural Pain: 0 Response to Treatment: Procedure was tolerated well Level of Consciousness (Post- Awake and Alert procedure): Post Debridement Measurements of Total Wound Length: (cm) 1.7 Width: (cm) 0.9 Depth: (cm) 0.2 Volume: (cm) 0.24 Character of Wound/Ulcer Post Debridement: Improved Severity of Tissue Post Debridement: Fat layer exposed Post Procedure Diagnosis Same as Pre-procedure Electronic Signature(s) Signed: 04/17/2020 5:36:57 PM By: Levan Hurst RN, BSN Signed: 04/17/2020 5:57:44 PM By: Linton Ham MD Entered By: Linton Ham on 04/17/2020 09:03:16 -------------------------------------------------------------------------------- Debridement Details Patient Name: Date of Service: [place] 04/17/2020 8:00 A M Medical Record Number:  [id number] Patient Account Number: [id number] Date of Birth/Sex: Treating RN: 1941-07-05 (79 y.o. [name] Provider: [name]., [name] Other Clinician: Referring Provider: Treating Provider/Extender: [name], [name] in Treatment: 27 Debridement Performed for Assessment: Wound #4 Right,Distal,Lateral Lower Leg Performed By: Physician Ricard Dillon., MD Debridement Type: Debridement Severity of Tissue Pre Debridement: Fat layer exposed Level of Consciousness (Pre-procedure): Awake and Alert Pre-procedure Verification/Time Out Yes - 08:55 Taken: Start Time: 08:55 T Area Debrided (L x W): otal 1.6 (cm) x 1.3 (cm) = 2.08 (cm) Tissue and other material debrided: Viable, Non-Viable, Slough, Subcutaneous, Slough Level: Skin/Subcutaneous Tissue Debridement Description: Excisional Instrument: Curette Bleeding: Minimum Hemostasis Achieved: Pressure End Time: 08:56 Procedural Pain: 3 Post Procedural Pain: 0 Response to Treatment: Procedure was tolerated well Level of Consciousness (Post- Awake and Alert procedure): Post Debridement Measurements of Total Wound Length: (cm) 1.6 Width: (cm) 1.3 Depth: (cm) 0.2 Volume: (cm) 0.327 Character of Wound/Ulcer Post Debridement: Improved Severity of Tissue Post Debridement: Fat layer exposed Post Procedure Diagnosis Same as Pre-procedure Electronic Signature(s) Signed: 04/17/2020 5:36:57 PM By: Levan Hurst RN, BSN Signed: 04/17/2020 5:57:44 PM By: Linton Ham MD Entered By: Linton Ham on 04/17/2020 09:03:24 -------------------------------------------------------------------------------- HPI Details Patient Name: Date of Service: [place] 04/17/2020 8:00 A M Medical Record Number: [id number] Patient Account Number: [id number] Date of Birth/Sex: Treating RN: Apr 03, 1941 (79 y.o. [name] Provider: [name]., [name] Other Clinician: Referring  Provider: Treating Provider/Extender: [name], [name] in Treatment: 27 History of Present Illness HPI Description: ADMISSION 10/11/2019 This is a 79 year old man with a history of a severe cardiomyopathy with an ejection fraction of about 20%, chronic renal failure stage III. He is listed as a type II diabetic in epic although the patient denies this. He also has a history of PVD. He states for the last month he has had wounds  on his bilateral lower extremities that started off as blisters which denuded. He has areas on the left lateral calf and 2 on the right lateral. He has an area on the left first met head which he did not know was there he we identified this on intake. He has been using Silvadene cream provided by his primary care physician but he is complaining that this burns. 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 He was acutely ill at that time complicating GI bleed. ABIs in our clinic were 1.16 on the right and 1.13 on the left 2/25; the patient comes in with his areas on the left lateral and right lateral calf. There is also an area over the left first MTP bunion deformity. We have been using Sorbact. His edema control is fairly good 3/4; left lateral and right lateral calf. Most of his wounds are in the same position tightly adherent nonviable debris. On the right we debrided the superior wound on the left both wounds. The area on his bunion over the left first MTP medially is I think just about closed. We have been using Sorbact without a lot of success changed to Iodoflex under compression 3/11;  left lateral and right lateral calf perhaps minor improvement in the surface condition. We have been using Iodoflex. The area over the bunion of the left first MTP has closed over 3/18; left lateral and right lateral calf not much improvement. We have been using Iodoflex. Aggressive debridement last week. 3/25; left lateral and right lateral calf. We have been using Iodoflex. There is some improvement in the superior area on the right and 2 on the lateral left although there is still a lot of debris on the surface. The inferior area on the right is still a completely nonviable surface. 4/8; left lateral and right lateral calfs. Essentially mirror-image looking wounds 2 wounds on each side in close juxtaposition we have been using Iodoflex with some improvement in the very adherent fibrinous debris but not a lot. The patient has arterial studies next Wednesday morning and venous studies next Thursday morning. I have been avoiding any further aggressive debridement until we see the arterial study results. His ABIs were fairly good in this clinic and is dorsalis pedis pulses are palpable but the wound beds are pale. 4/15; left lateral and right lateral calfs. Essentially mirror-image wounds with 2 wounds on each side in close juxtaposition but with rims of separating normal tissue. We have been using Iodoflex. The base of the wounds has been cleaning up quite nicely ARTERIAL STUDIES were done showing the patient had an ABI on the right of 1.27 with triphasic waveforms and a TBI of 0.90 on the left triphasic waveforms with an ABI of 1.28 and a TBI of 0.87. No evidence of arterial disease VENOUS REFLUX STUDIES; were done yesterday we do not have this report yet. 4/23; VENOUS REFLUX STUDIES did not show any significant reflux right lower extremity. No evidence of a DVT He did have significant reflux in the common . femoral vein on the left but nothing else was listed as significant. He did not have a  DVT. We have been using Iodoflex under compression  his wounds are making progress. 4/29; improvements in the wound surface continue. We have been using Iodoflex. He has a 20% out-of-pocket co-pay for Apligraf which is unfortunate. We changed him to Sorbact today 5/6; started on Sorbact last week. Better looking wound surface but not much change in dimensions. 01/24/20-Patient is back at 3 weeks, wound surfaces are about the same but very minimal slough on the right leg wounds, however there is blistering on both legs adjacent to the wounds, patient denies any other symptoms or new symptoms. His studies have been reviewed and do not indicate any significant arterial or venous disease. But he is attending once in 3 weeks with home health changing in between 6/3; patient has 4 wounds 2 on each side of his lateral lower legs. These wounds are somewhat improved. He apparently arrived in clinic last week with a large blister on the right lateral lower leg that had denuded into the wound. They changed to silver alginate. Still under compression. He has straight Medicare and unfortunately has an unlimited co-pay for advanced treatment options. 6/10; his original 4 wounds are all better especially on the left lateral lower leg. Areas on the right medial have a better looking surface were using silver collagen The blister on the right lateral lower leg from last week has an open area however this looks fairly healthy were using silver alginate on this He comes in today having "stubbed" his left second toe he has an abrasion at the base of the nailbed I think he probably caught the nail which is mycotic. 6/24; blister on the right lateral lower leg has healed. There is no additional wounds. The traumatic left second toe is also closed. The 4 that he has are all better 7/1; all of the patient's wounds are smaller except for the proximal one on the left lateral calf. We are using silver collagen 7/22; patient has  home health. Relatively refractory wounds on the lateral part of both mid calfs. Each of them 2 wounds. The areas on the left are doing much better in fact the distal 1 looks like it is on his way to closure. The right required debridement with adherent fibrinous debris under illumination. We have been using silver collagen. Previous arterial studies were within normal limits he also had venous reflux studies 04/03/2020 upon evaluation today patient appears to be doing excellent with regard to his lower extremity ulcers. He is going to require a little bit of debridement on the wounds on the right especially in order to clear away some biofilm and slough but this is minimal and overall he seems to be doing quite well. 8/19; the patient's wounds on the left lateral lower leg just above closed. However the 2 on the lateral part of the right have a nonviable necrotic surface. This is disappointing. There is also no change in dimensions. He has had arterial studies as well as venous reflux studies Electronic Signature(s) Signed: 04/17/2020 5:57:44 PM By: Linton Ham MD Entered By: Linton Ham on 04/17/2020 09:05:03 -------------------------------------------------------------------------------- Physical Exam Details Patient Name: Date of Service: [place] 04/17/2020 8:00 A M Medical Record Number: [id number] Patient Account Number: [id number] Date of Birth/Sex: Treating RN: 11/29/1940 (79 y.o. [name] Provider: [name]., [name] Other Clinician: Referring Provider: Treating Provider/Extender: [name], [name] in Treatment: 27 Constitutional Sitting or standing Blood Pressure is within target range for patient.. Pulse regular and within target range for  patient.Marland Kitchen Respirations regular, non-labored and within target range.. Temperature is normal and within the target range for the patient.Marland Kitchen Appears in no distress. Notes Wound exam;  on the left the areas are just about closed 1 still has a small open area with depth. On the right adherent debris over the wound surface with no change in dimensions. Using a #5 curette this was removed. Hemostasis with direct pressure. Electronic Signature(s) Signed: 04/17/2020 5:57:44 PM By: Linton Ham MD Entered By: Linton Ham on 04/17/2020 09:05:46 -------------------------------------------------------------------------------- Physician Orders Details Patient Name: Date of Service: [place] 04/17/2020 8:00 [place] Record Number: [id number] Patient Account Number: [id number] Date of Birth/Sex: Treating RN: March 23, 1941 (79 y.o. [name] Provider: [name]., [name] Other Clinician: Referring Provider: Treating Provider/Extender: [name], [name] in Treatment: 27 Verbal / Phone Orders: No Diagnosis Coding ICD-10 Coding Code Description [phone] Chronic venous hypertension (idiopathic) with inflammation of bilateral lower extremity I89.0 Lymphedema, not elsewhere classified L97.822 Non-pressure chronic ulcer of other part of left lower leg with fat layer exposed L97.812 Non-pressure chronic ulcer of other part of right lower leg with fat layer exposed S90.812D Abrasion, left foot, subsequent encounter Follow-up Appointments Return Appointment in 1 week. Dressing Change Frequency Other: - all wounds - change twice a week by home health Skin Barriers/Peri-Wound Care Moisturizing lotion Wound Cleansing May shower with protection. - use cast protectors on the days dressings are not changed. May shower and wash wound with soap and water. - with dressing changes only. Primary Wound Dressing Wound #1 Left,Lateral Lower Leg Silver Collagen - moisten with hydrogel Wound #3 Right,Proximal,Lateral Lower Leg Silver Collagen - moisten with hydrogel Wound #4 Right,Distal,Lateral Lower Leg Silver Collagen - moisten  with hydrogel Wound #6 Left,Proximal,Lateral Lower Leg Silver Collagen - moisten with hydrogel Secondary Dressing Dry Gauze - all wounds ABD pad - all wounds Other: - pad left medial first met head for protection. Edema Control 4 layer compression - Bilateral Avoid standing for long periods of time Elevate legs to the level of the heart or above for 30 minutes daily and/or when sitting, a frequency of: - throughout the day. [place] [facility] skilled nursing for wound care. [facility] Electronic Signature(s) Signed: 04/17/2020 5:36:57 PM By: Levan Hurst RN, BSN Signed: 04/17/2020 5:57:44 PM By: Linton Ham MD Entered By: Levan Hurst on 04/17/2020 08:59:28 -------------------------------------------------------------------------------- Problem List Details Patient Name: Date of Service: [place] 04/17/2020 8:00 [place] Record Number: [id number] Patient Account Number: [id number] Date of Birth/Sex: Treating RN: [birth year] (79 y.o. [name] Provider: [name]., [name] Other Clinician: Referring Provider: Treating Provider/Extender: [name], [name] in Treatment: 27 Active Problems ICD-10 Encounter Code Description Active Date MDM Diagnosis I87.323 Chronic venous hypertension (idiopathic) with inflammation of bilateral lower 10/11/2019 No Yes extremity I89.0 Lymphedema, not elsewhere classified 10/11/2019 No Yes L97.822 Non-pressure chronic ulcer of other part of left lower leg with fat layer exposed2/06/2020 No Yes L97.812 Non-pressure chronic ulcer of other part of right lower leg with fat layer 01/03/2020 No Yes exposed S90.812D Abrasion, left foot, subsequent encounter 02/07/2020 No Yes Inactive Problems ICD-10 Code Description Active Date Inactive Date L97.521 Non-pressure chronic ulcer of other part of left foot limited to breakdown of skin 10/11/2019 10/11/2019 Resolved  Problems ICD-10 Code Description Active Date Resolved Date L97.112 Non-pressure chronic ulcer of right  thigh with fat layer exposed 10/11/2019 10/11/2019 Electronic Signature(s) Signed: 04/17/2020 5:57:44 PM By: Linton Ham MD Entered By: Linton Ham on 04/17/2020 09:02:56 -------------------------------------------------------------------------------- Progress Note Details Patient Name: Date of Service: [place] 04/17/2020 8:00 [place] Record Number: [id number] Patient Account Number: [id number] Date of Birth/Sex: Treating RN: 11/22/1940 (79 y.o. [name] Provider: [name]., [name] Other Clinician: Referring Provider: Treating Provider/Extender: [name], [name] in Treatment: 27 Subjective History of Present Illness (HPI) ADMISSION 10/11/2019 This is a 79 year old man with a history of a severe cardiomyopathy with an ejection fraction of about 20%, chronic renal failure stage III. He is listed as a type II diabetic in epic although the patient denies this. He also has a history of PVD. He states for the last month he has had wounds on his bilateral lower extremities that started off as blisters which denuded. He has areas on the left lateral calf and 2 on the right lateral. He has an area on the left first met head which he did not know was there he we identified this on intake. He has been using Silvadene cream provided by his primary care physician but he is complaining that this burns. 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 He was acutely ill at that time  complicating GI bleed. ABIs in our clinic were 1.16 on the right and 1.13 on the left 2/25; the patient comes in with his areas on the left lateral and right lateral calf. There is also an area over the left first MTP bunion deformity. We have been using Sorbact. His edema control is fairly good 3/4; left lateral and right lateral calf. Most of his wounds are in the same position tightly adherent nonviable debris. On the right we debrided the superior wound on the left both wounds. The area on his bunion over the left first MTP medially is I think just about closed. We have been using Sorbact without a lot of success changed to Iodoflex under compression 3/11; left lateral and right lateral calf perhaps minor improvement in the surface condition. We have been using Iodoflex. The area over the bunion of the left first MTP has closed over 3/18; left lateral and right lateral calf not much improvement. We have been using Iodoflex. Aggressive debridement last week. 3/25; left lateral and right lateral calf. We have been using Iodoflex. There is some improvement in the superior area on the right and 2 on the lateral left although there is still a lot of debris on the surface. The inferior area on the right is still a completely nonviable surface. 4/8; left lateral and right lateral calfs. Essentially mirror-image looking wounds 2 wounds on each side in close juxtaposition we have been using Iodoflex with some improvement in the very adherent fibrinous debris but not a lot. The patient has arterial studies next Wednesday morning and venous studies next Thursday morning. I have been avoiding any further aggressive debridement until we see the arterial study results. His ABIs were fairly good in this clinic and is dorsalis pedis pulses are palpable but the wound beds are pale.  4/15; left lateral and right lateral calfs. Essentially mirror-image wounds with 2 wounds on each side in close juxtaposition but  with rims of separating normal tissue. We have been using Iodoflex. The base of the wounds has been cleaning up quite nicely ARTERIAL STUDIES were done showing the patient had an ABI on the right of 1.27 with triphasic waveforms and a TBI of 0.90 on the left triphasic waveforms with an ABI of 1.28 and a TBI of 0.87. No evidence of arterial disease VENOUS REFLUX STUDIES; were done yesterday we do not have this report yet. 4/23; VENOUS REFLUX STUDIES did not show any significant reflux right lower extremity. No evidence of a DVT He did have significant reflux in the common . femoral vein on the left but nothing else was listed as significant. He did not have a DVT. We have been using Iodoflex under compression his wounds are making progress. 4/29; improvements in the wound surface continue. We have been using Iodoflex. He has a 20% out-of-pocket co-pay for Apligraf which is unfortunate. We changed him to Sorbact today 5/6; started on Sorbact last week. Better looking wound surface but not much change in dimensions. 01/24/20-Patient is back at 3 weeks, wound surfaces are about the same but very minimal slough on the right leg wounds, however there is blistering on both legs adjacent to the wounds, patient denies any other symptoms or new symptoms. His studies have been reviewed and do not indicate any significant arterial or venous disease. But he is attending once in 3 weeks with home health changing in between 6/3; patient has 4 wounds 2 on each side of his lateral lower legs. These wounds are somewhat improved. He apparently arrived in clinic last week with a large blister on the right lateral lower leg that had denuded into the wound. They changed to silver alginate. Still under compression. He has straight Medicare and unfortunately has an unlimited co-pay for advanced treatment options. 6/10; his original 4 wounds are all better especially on the left lateral lower leg. Areas on the right medial  have a better looking surface were using silver collagen ooThe blister on the right lateral lower leg from last week has an open area however this looks fairly healthy were using silver alginate on this ooHe comes in today having "stubbed" his left second toe he has an abrasion at the base of the nailbed I think he probably caught the nail which is mycotic. 6/24; blister on the right lateral lower leg has healed. There is no additional wounds. The traumatic left second toe is also closed. The 4 that he has are all better 7/1; all of the patient's wounds are smaller except for the proximal one on the left lateral calf. We are using silver collagen 7/22; patient has home health. Relatively refractory wounds on the lateral part of both mid calfs. Each of them 2 wounds. The areas on the left are doing much better in fact the distal 1 looks like it is on his way to closure. The right required debridement with adherent fibrinous debris under illumination. We have been using silver collagen. Previous arterial studies were within normal limits he also had venous reflux studies 04/03/2020 upon evaluation today patient appears to be doing excellent with regard to his lower extremity ulcers. He is going to require a little bit of debridement on the wounds on the right especially in order to clear away some biofilm and slough but this is minimal and overall he seems to be  doing quite well. 8/19; the patient's wounds on the left lateral lower leg just above closed. However the 2 on the lateral part of the right have a nonviable necrotic surface. This is disappointing. There is also no change in dimensions. He has had arterial studies as well as venous reflux studies Objective Constitutional Sitting or standing Blood Pressure is within target range for patient.. Pulse regular and within target range for patient.Marland Kitchen Respirations regular, non-labored and within target range.. Temperature is normal and within the  target range for the patient.Marland Kitchen Appears in no distress. Vitals Time Taken: 8:14 AM, Height: 71 in, Weight: 198 lbs, BMI: 27.6, Temperature: 97.8 F, Pulse: 98 bpm, Respiratory Rate: 19 breaths/min, Blood Pressure: 126/72 mmHg. General Notes: Wound exam; on the left the areas are just about closed 1 still has a small open area with depth. On the right adherent debris over the wound surface with no change in dimensions. Using a #5 curette this was removed. Hemostasis with direct pressure. Integumentary (Hair, Skin) Wound #1 status is Open. Original cause of wound was Blister. The wound is located on the Left,Lateral Lower Leg. The wound measures 0.8cm length x 0.6cm width x 0.1cm depth; 0.377cm^2 area and 0.038cm^3 volume. There is Fat Layer (Subcutaneous Tissue) Exposed exposed. There is no tunneling or undermining noted. There is a medium amount of serosanguineous drainage noted. The wound margin is distinct with the outline attached to the wound base. There is large (67-100%) red, pink granulation within the wound bed. There is a small (1-33%) amount of necrotic tissue within the wound bed including Adherent Slough. Wound #3 status is Open. Original cause of wound was Blister. The wound is located on the Right,Proximal,Lateral Lower Leg. The wound measures 1.7cm length x 0.9cm width x 0.2cm depth; 1.202cm^2 area and 0.24cm^3 volume. There is Fat Layer (Subcutaneous Tissue) Exposed exposed. There is no tunneling or undermining noted. There is a medium amount of serosanguineous drainage noted. The wound margin is distinct with the outline attached to the wound base. There is medium (34-66%) red granulation within the wound bed. There is a medium (34-66%) amount of necrotic tissue within the wound bed including Adherent Slough. Wound #4 status is Open. Original cause of wound was Blister. The wound is located on the Right,Distal,Lateral Lower Leg. The wound measures 1.6cm length x 1.3cm width x 0.2cm  depth; 1.634cm^2 area and 0.327cm^3 volume. There is Fat Layer (Subcutaneous Tissue) Exposed exposed. There is no tunneling or undermining noted. There is a medium amount of serosanguineous drainage noted. The wound margin is distinct with the outline attached to the wound base. There is medium (34-66%) red granulation within the wound bed. There is a medium (34-66%) amount of necrotic tissue within the wound bed including Adherent Slough. Wound #6 status is Open. Original cause of wound was Gradually Appeared. The wound is located on the Left,Proximal,Lateral Lower Leg. The wound measures 0.9cm length x 0.3cm width x 0.1cm depth; 0.212cm^2 area and 0.021cm^3 volume. There is Fat Layer (Subcutaneous Tissue) Exposed exposed. There is no tunneling or undermining noted. There is a medium amount of serosanguineous drainage noted. The wound margin is distinct with the outline attached to the wound base. There is large (67-100%) red, pink granulation within the wound bed. There is a small (1-33%) amount of necrotic tissue within the wound bed including Adherent Slough. Assessment Active Problems ICD-10 Chronic venous hypertension (idiopathic) with inflammation of bilateral lower extremity Lymphedema, not elsewhere classified Non-pressure chronic ulcer of other part of left lower  leg with fat layer exposed Non-pressure chronic ulcer of other part of right lower leg with fat layer exposed Abrasion, left foot, subsequent encounter Procedures Wound #3 Pre-procedure diagnosis of Wound #3 is a Venous Leg Ulcer located on the Right,Proximal,Lateral Lower Leg .Severity of Tissue Pre Debridement is: Fat layer exposed. There was a Excisional Skin/Subcutaneous Tissue Debridement with a total area of 1.53 sq cm performed by Ricard Dillon., MD. With the following instrument(s): Curette to remove Viable and Non-Viable tissue/material. Material removed includes Subcutaneous Tissue and Slough and. No specimens  were taken. A time out was conducted at 08:55, prior to the start of the procedure. A Minimum amount of bleeding was controlled with Pressure. The procedure was tolerated well with a pain level of 3 throughout and a pain level of 0 following the procedure. Post Debridement Measurements: 1.7cm length x 0.9cm width x 0.2cm depth; 0.24cm^3 volume. Character of Wound/Ulcer Post Debridement is improved. Severity of Tissue Post Debridement is: Fat layer exposed. 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 There was a Four Layer Compression Therapy Procedure by Levan Hurst, RN. Post procedure Diagnosis Wound #3: Same as Pre-Procedure Wound #4 Pre-procedure diagnosis of Wound #4 is a Venous Leg Ulcer located on the Right,Distal,Lateral Lower Leg .Severity of Tissue Pre Debridement is: Fat layer exposed. There was a Excisional Skin/Subcutaneous Tissue Debridement with a total area of 2.08 sq cm performed by Ricard Dillon., MD. With the following instrument(s): Curette to remove Viable and Non-Viable tissue/material. Material removed includes Subcutaneous Tissue and Slough and. No specimens were taken. A time out was conducted at 08:55, prior to the start of the procedure. A Minimum amount of bleeding was controlled with Pressure. The procedure was tolerated well with a pain level of 3 throughout and a pain level of 0 following the procedure. Post Debridement Measurements: 1.6cm length x 1.3cm width x 0.2cm depth; 0.327cm^3 volume. Character of Wound/Ulcer Post Debridement is improved. Severity of Tissue Post Debridement is: Fat layer exposed. 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 There was a Four Layer Compression Therapy Procedure by Levan Hurst,  RN. Post procedure Diagnosis Wound #4: Same as Pre-Procedure Wound #1 Pre-procedure diagnosis of Wound #1 is a Venous Leg Ulcer located on the Left,Lateral Lower Leg . There was a Four Layer Compression Therapy Procedure by Levan Hurst, RN. Post procedure Diagnosis Wound #1: Same as Pre-Procedure Wound #6 Pre-procedure diagnosis of Wound #6 is a Venous Leg Ulcer located on the Left,Proximal,Lateral Lower Leg . There was a Four Layer Compression Therapy Procedure by Levan Hurst, RN. Post procedure Diagnosis Wound #6: Same as Pre-Procedure Plan Follow-up Appointments: Return Appointment in 1 week. Dressing Change Frequency: Other: - all wounds - change twice a week by home health Skin Barriers/Peri-Wound Care: Moisturizing lotion Wound Cleansing: May shower with protection. - use cast protectors on the days dressings are not changed. May shower and wash wound with soap and water. - with dressing changes only. Primary Wound Dressing: Wound #1 Left,Lateral Lower Leg: Silver Collagen - moisten with hydrogel Wound #3 Right,Proximal,Lateral Lower Leg: Silver Collagen - moisten with hydrogel Wound #4 Right,Distal,Lateral Lower Leg: Silver Collagen - moisten with hydrogel Wound #6 Left,Proximal,Lateral Lower Leg: Silver Collagen - moisten with hydrogel Secondary Dressing: Dry Gauze - all wounds ABD pad - all  wounds Other: - pad left medial first met head for protection. Edema Control: 4 layer compression - Bilateral Avoid standing for long periods of time Elevate legs to the level of the heart or above for 30 minutes daily and/or when sitting, a frequency of: - throughout the day. Home Health: [facility] skilled [facility] for wound care. - [facility] I continue with silver collagen on both sides. The areas on the left are just about closed only one of them has any open area of any significance. 2. The area on the right was disappointing today. Mechanical  debridement today I am still using silver collagen however I will increase his visits to weekly and apply continue have to look at nonviable surface I will have to change the dressing to something to help with the surface of the wounds. These will not close like this. Further mechanical debridements may be necessary on the right Electronic Signature(s) Signed: 04/17/2020 5:57:44 PM By: Linton Ham MD Entered By: Linton Ham on 04/17/2020 09:06:48 -------------------------------------------------------------------------------- SuperBill Details Patient Name: Date of Service: [place] 04/17/2020 Medical Record Number: [id number] Patient Account Number: [id number] Date of Birth/Sex: Treating RN: 07-15-1941 (79 y.o. [name] Provider: [name]., [name] Other Clinician: Referring Provider: Treating Provider/Extender: [name], [name] in Treatment: 27 Diagnosis Coding ICD-10 Codes Code Description [id number] Chronic venous hypertension (idiopathic) with inflammation of bilateral lower extremity I89.0 Lymphedema, not elsewhere classified L97.822 Non-pressure chronic ulcer of other part of left lower leg with fat layer exposed L97.812 Non-pressure chronic ulcer of other part of right lower leg with fat layer exposed S90.812D Abrasion, left foot, subsequent encounter Facility Procedures The patient participates with Medicare or their insurance follows the Medicare Facility Guidelines: CPT4 Code Description Modifier Quantity [id number] 11042 - DEB SUBQ TISSUE 20 SQ CM/< 1 ICD-10 Diagnosis Description W88.891 Non-pressure chronic ulcer of  other part of right lower leg with fat layer exposed The patient participates with Medicare or their insurance follows the Medicare Facility Guidelines: [id number] (Facility Use Only) 29581LT - APPLY MULTLAY COMPRS LWR LT LEG 59 1 Physician Procedures : CPT4 Code Description Modifier [id number] 11042 -  WC PHYS SUBQ TISS 20 SQ CM ICD-10 Diagnosis Description J17.915 Non-pressure chronic ulcer of other part of right lower leg with fat layer exposed Quantity: 1 Electronic Signature(s) Signed: 04/17/2020 5:36:57 PM By: Levan Hurst RN, BSN Signed: 04/17/2020 5:57:44 PM By: Linton Ham MD Entered By: Levan Hurst on 04/17/2020 09:25:28

## 2020-04-24 ENCOUNTER — Encounter (HOSPITAL_BASED_OUTPATIENT_CLINIC_OR_DEPARTMENT_OTHER): Payer: Medicare Other | Attending: Internal Medicine | Admitting: Internal Medicine

## 2020-04-24 ENCOUNTER — Other Ambulatory Visit: Payer: Self-pay

## 2020-04-24 DIAGNOSIS — I890 Lymphedema, not elsewhere classified: Secondary | ICD-10-CM | POA: Diagnosis not present

## 2020-04-24 DIAGNOSIS — I87333 Chronic venous hypertension (idiopathic) with ulcer and inflammation of bilateral lower extremity: Secondary | ICD-10-CM | POA: Diagnosis present

## 2020-04-24 DIAGNOSIS — N183 Chronic kidney disease, stage 3 unspecified: Secondary | ICD-10-CM | POA: Diagnosis not present

## 2020-04-24 DIAGNOSIS — I739 Peripheral vascular disease, unspecified: Secondary | ICD-10-CM | POA: Diagnosis not present

## 2020-04-24 DIAGNOSIS — I429 Cardiomyopathy, unspecified: Secondary | ICD-10-CM | POA: Insufficient documentation

## 2020-04-24 DIAGNOSIS — L97812 Non-pressure chronic ulcer of other part of right lower leg with fat layer exposed: Secondary | ICD-10-CM | POA: Insufficient documentation

## 2020-04-24 DIAGNOSIS — L97822 Non-pressure chronic ulcer of other part of left lower leg with fat layer exposed: Secondary | ICD-10-CM | POA: Diagnosis not present

## 2020-04-24 NOTE — Progress Notes (Signed)
CLOYD, RAGAS ([id number]) Visit Report for 04/24/2020 Debridement Details Patient Name: Date of Service: [place], [place] 04/24/2020 8:00 A M Medical Record Number: [id number] Patient Account Number: [id number] Date of Birth/Sex: Treating RN: 15-Apr-1941 (79 y.o. [name] Provider: [name]., [name] Other Clinician: Referring Provider: Treating Provider/Extender: [name], [name] in Treatment: 28 Debridement Performed for Assessment: Wound #1 Left,Lateral Lower Leg Performed By: Physician Ricard Dillon., MD Debridement Type: Debridement Severity of Tissue Pre Debridement: Fat layer exposed Level of Consciousness (Pre-procedure): Awake and Alert Pre-procedure Verification/Time Out Yes - 08:38 Taken: Start Time: 08:38 T Area Debrided (L x W): otal 0.8 (cm) x 0.7 (cm) = 0.56 (cm) Tissue and other material debrided: Viable, Non-Viable, Slough, Subcutaneous, Slough Level: Skin/Subcutaneous Tissue Debridement Description: Excisional Instrument: Curette Bleeding: Minimum Hemostasis Achieved: Pressure End Time: 08:40 Procedural Pain: 3 Post Procedural Pain: 0 Response to Treatment: Procedure was tolerated well Level of Consciousness (Post- Awake and Alert procedure): Post Debridement Measurements of Total Wound Length: (cm) 0.8 Width: (cm) 0.7 Depth: (cm) 0.1 Volume: (cm) 0.044 Character of Wound/Ulcer Post Debridement: Improved Severity of Tissue Post Debridement: Fat layer exposed Post Procedure Diagnosis Same as Pre-procedure Electronic Signature(s) Signed: 04/24/2020 4:47:33 PM By: Levan Hurst RN, BSN Signed: 04/24/2020 6:08:30 PM By: Linton Ham MD Entered By: Linton Ham on 04/24/2020 08:50:13 -------------------------------------------------------------------------------- Debridement Details Patient Name: Date of Service: [place], [place] 04/24/2020 8:00 A M Medical Record Number:  [id number] Patient Account Number: [id number] Date of Birth/Sex: Treating RN: Feb 04, 1941 (79 y.o. [name] Provider: [name]., [name] Other Clinician: Referring Provider: Treating Provider/Extender: [name], [name] in Treatment: 28 Debridement Performed for Assessment: Wound #3 Right,Proximal,Lateral Lower Leg Performed By: Physician Ricard Dillon., MD Debridement Type: Debridement Severity of Tissue Pre Debridement: Fat layer exposed Level of Consciousness (Pre-procedure): Awake and Alert Pre-procedure Verification/Time Out Yes - 08:38 Taken: Start Time: 08:38 T Area Debrided (L x W): otal 1.6 (cm) x 1.3 (cm) = 2.08 (cm) Tissue and other material debrided: Viable, Non-Viable, Slough, Subcutaneous, Slough Level: Skin/Subcutaneous Tissue Debridement Description: Excisional Instrument: Curette Bleeding: Minimum Hemostasis Achieved: Pressure End Time: 08:40 Procedural Pain: 3 Post Procedural Pain: 0 Response to Treatment: Procedure was tolerated well Level of Consciousness (Post- Awake and Alert procedure): Post Debridement Measurements of Total Wound Length: (cm) 1.6 Width: (cm) 1.3 Depth: (cm) 0.3 Volume: (cm) 0.49 Character of Wound/Ulcer Post Debridement: Improved Severity of Tissue Post Debridement: Fat layer exposed Post Procedure Diagnosis Same as Pre-procedure Electronic Signature(s) Signed: 04/24/2020 4:47:33 PM By: Levan Hurst RN, BSN Signed: 04/24/2020 6:08:30 PM By: Linton Ham MD Entered By: Linton Ham on 04/24/2020 08:50:22 -------------------------------------------------------------------------------- Debridement Details Patient Name: Date of Service: [place], [place] 04/24/2020 8:00 A M Medical Record Number: [id number] Patient Account Number: [id number] Date of Birth/Sex: Treating RN: 10/06/1940 (79 y.o. [name] Provider: [name]., [name] Other  Clinician: Referring Provider: Treating Provider/Extender: [name], [name] in Treatment: 28 Debridement Performed for Assessment: Wound #4 Right,Distal,Lateral Lower Leg Performed By: Physician Ricard Dillon., MD Debridement Type: Debridement Severity of Tissue Pre Debridement: Fat layer exposed Level of Consciousness (Pre-procedure): Awake and Alert Pre-procedure Verification/Time Out Yes - 08:38 Taken: Start Time: 08:38 T Area Debrided (L x W): otal 1.7 (cm) x 1.5 (cm) = 2.55 (cm) Tissue and other material debrided:  Viable, Non-Viable, Slough, Subcutaneous, Slough Level: Skin/Subcutaneous Tissue Debridement Description: Excisional Instrument: Curette Bleeding: Minimum Hemostasis Achieved: Pressure End Time: 08:40 Procedural Pain: 3 Post Procedural Pain: 0 Response to Treatment: Procedure was tolerated well Level of Consciousness (Post- Awake and Alert procedure): Post Debridement Measurements of Total Wound Length: (cm) 1.7 Width: (cm) 1.5 Depth: (cm) 0.4 Volume: (cm) 0.801 Character of Wound/Ulcer Post Debridement: Improved Severity of Tissue Post Debridement: Fat layer exposed Post Procedure Diagnosis Same as Pre-procedure Electronic Signature(s) Signed: 04/24/2020 4:47:33 PM By: Levan Hurst RN, BSN Signed: 04/24/2020 6:08:30 PM By: Linton Ham MD Entered By: Linton Ham on 04/24/2020 08:50:32 -------------------------------------------------------------------------------- Debridement Details Patient Name: Date of Service: [place], [place] 04/24/2020 8:00 A M Medical Record Number: [id number] Patient Account Number: [id number] Date of Birth/Sex: Treating RN: [birth year] (79 y.o. [name] Provider: [name]., [name] Other Clinician: Referring Provider: Treating Provider/Extender: [name], [name] in Treatment: 28 Debridement Performed for Assessment: Wound #6  Left,Proximal,Lateral Lower Leg Performed By: Physician Ricard Dillon., MD Debridement Type: Debridement Severity of Tissue Pre Debridement: Fat layer exposed Level of Consciousness (Pre-procedure): Awake and Alert Pre-procedure Verification/Time Out Yes - 08:38 Taken: Start Time: 08:38 T Area Debrided (L x W): otal 1.4 (cm) x 1 (cm) = 1.4 (cm) Tissue and other material debrided: Viable, Non-Viable, Slough, Subcutaneous, Slough Level: Skin/Subcutaneous Tissue Debridement Description: Excisional Instrument: Curette Bleeding: Minimum Hemostasis Achieved: Pressure End Time: 08:40 Procedural Pain: 3 Post Procedural Pain: 0 Response to Treatment: Procedure was tolerated well Level of Consciousness (Post- Awake and Alert procedure): Post Debridement Measurements of Total Wound Length: (cm) 1.4 Width: (cm) 1 Depth: (cm) 0.1 Volume: (cm) 0.11 Character of Wound/Ulcer Post Debridement: Improved Severity of Tissue Post Debridement: Fat layer exposed Post Procedure Diagnosis Same as Pre-procedure Electronic Signature(s) Signed: 04/24/2020 4:47:33 PM By: Levan Hurst RN, BSN Signed: 04/24/2020 6:08:30 PM By: Linton Ham MD Entered By: Linton Ham on 04/24/2020 08:50:41 -------------------------------------------------------------------------------- HPI Details Patient Name: Date of Service: [place], [place] 04/24/2020 8:00 A M Medical Record Number: [id number] Patient Account Number: [id number] Date of Birth/Sex: Treating RN: 12-22-1940 (79 y.o. [name] Provider: [name]., [name] Other Clinician: Referring Provider: Treating Provider/Extender: [name], [name] in Treatment: 28 History of Present Illness HPI Description: ADMISSION 10/11/2019 This is a 79 year old man with a history of a severe cardiomyopathy with an ejection fraction of about 20%, chronic renal failure stage III. He is listed as a type II  diabetic in epic although the patient denies this. He also has a history of PVD. He states for the last month he has had wounds on his bilateral lower extremities that started off as blisters which denuded. He has areas on the left lateral calf and 2 on the right lateral. He has an area on the left first met head which he did not know was there he we identified this on intake. He has been using Silvadene cream provided by his primary care physician but he is complaining that this burns. 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 He was  acutely ill at that time complicating GI bleed. ABIs in our clinic were 1.16 on the right and 1.13 on the left 2/25; the patient comes in with his areas on the left lateral and right lateral calf. There is also an area over the left first MTP bunion deformity. We have been using Sorbact. His edema control is fairly good 3/4; left lateral and right lateral calf. Most of his wounds are in the same position tightly adherent nonviable debris. On the right we debrided the superior wound on the left both wounds. The area on his bunion over the left first MTP medially is I think just about closed. We have been using Sorbact without a lot of success changed to Iodoflex under compression 3/11; left lateral and right lateral calf perhaps minor improvement in the surface condition. We have been using Iodoflex. The area over the bunion of the left first MTP has closed over 3/18; left lateral and right lateral calf not much improvement. We have been using Iodoflex. Aggressive debridement last week. 3/25; left lateral and right  lateral calf. We have been using Iodoflex. There is some improvement in the superior area on the right and 2 on the lateral left although there is still a lot of debris on the surface. The inferior area on the right is still a completely nonviable surface. 4/8; left lateral and right lateral calfs. Essentially mirror-image looking wounds 2 wounds on each side in close juxtaposition we have been using Iodoflex with some improvement in the very adherent fibrinous debris but not a lot. The patient has arterial studies next Wednesday morning and venous studies next Thursday morning. I have been avoiding any further aggressive debridement until we see the arterial study results. His ABIs were fairly good in this clinic and is dorsalis pedis pulses are palpable but the wound beds are pale. 4/15; left lateral and right lateral calfs. Essentially mirror-image wounds with 2 wounds on each side in close juxtaposition but with rims of separating normal tissue. We have been using Iodoflex. The base of the wounds has been cleaning up quite nicely ARTERIAL STUDIES were done showing the patient had an ABI on the right of 1.27 with triphasic waveforms and a TBI of 0.90 on the left triphasic waveforms with an ABI of 1.28 and a TBI of 0.87. No evidence of arterial disease VENOUS REFLUX STUDIES; were done yesterday we do not have this report yet. 4/23; VENOUS REFLUX STUDIES did not show any significant reflux right lower extremity. No evidence of a DVT He did have significant reflux in the common . femoral vein on the left but nothing else was listed as significant. He did not have a DVT. We have been using Iodoflex under compression his wounds are making progress. 4/29; improvements in the wound surface continue. We have been using Iodoflex. He has a 20% out-of-pocket co-pay for Apligraf which is unfortunate. We changed him to Sorbact today 5/6; started on Sorbact last week. Better looking wound surface but not much  change in dimensions. 01/24/20-Patient is back at 3 weeks, wound surfaces are about the same but very minimal slough on the right leg wounds, however there is blistering on both legs adjacent to the wounds, patient denies any other symptoms or new symptoms. His studies have been reviewed and do not indicate any significant arterial or venous disease. But he is attending once in 3 weeks with home health changing in between 6/3; patient has 4 wounds 2 on each side of his lateral lower  legs. These wounds are somewhat improved. He apparently arrived in clinic last week with a large blister on the right lateral lower leg that had denuded into the wound. They changed to silver alginate. Still under compression. He has straight Medicare and unfortunately has an unlimited co-pay for advanced treatment options. 6/10; his original 4 wounds are all better especially on the left lateral lower leg. Areas on the right medial have a better looking surface were using silver collagen The blister on the right lateral lower leg from last week has an open area however this looks fairly healthy were using silver alginate on this He comes in today having "stubbed" his left second toe he has an abrasion at the base of the nailbed I think he probably caught the nail which is mycotic. 6/24; blister on the right lateral lower leg has healed. There is no additional wounds. The traumatic left second toe is also closed. The 4 that he has are all better 7/1; all of the patient's wounds are smaller except for the proximal one on the left lateral calf. We are using silver collagen 7/22; patient has home health. Relatively refractory wounds on the lateral part of both mid calfs. Each of them 2 wounds. The areas on the left are doing much better in fact the distal 1 looks like it is on his way to closure. The right required debridement with adherent fibrinous debris under illumination. We have been using silver collagen. Previous  arterial studies were within normal limits he also had venous reflux studies 04/03/2020 upon evaluation today patient appears to be doing excellent with regard to his lower extremity ulcers. He is going to require a little bit of debridement on the wounds on the right especially in order to clear away some biofilm and slough but this is minimal and overall he seems to be doing quite well. 8/19; the patient's wounds on the left lateral lower leg just above closed. However the 2 on the lateral part of the right have a nonviable necrotic surface. This is disappointing. There is also no change in dimensions. He has had arterial studies as well as venous reflux studies 8/26; very disappointing today. Even the more superficial areas on the left are about the same as last week. Still 2 punched-out areas completely unchanged on the right. We have been using silver collagen really making no progress. Electronic Signature(s) Signed: 04/24/2020 6:08:30 PM By: Linton Ham MD Signed: 04/24/2020 6:08:30 PM By: Linton Ham MD Entered By: Linton Ham on 04/24/2020 08:56:51 -------------------------------------------------------------------------------- Physical Exam Details Patient Name: Date of Service: [place] 04/24/2020 8:00 A M Medical Record Number: [id number] Patient Account Number: [id number] Date of Birth/Sex: Treating RN: 09-Mar-1941 (79 y.o. [name] Provider: [name]., [name] Other Clinician: Referring Provider: Treating Provider/Extender: [name], [name] in Treatment: 28 Constitutional Sitting or standing Blood Pressure is within target range for patient.. Pulse regular and within target range for patient.Marland Kitchen Respirations regular, non-labored and within target range.. Temperature is normal and within the target range for the patient.Marland Kitchen Appears in no distress. Notes Wound exam; the area on the left is much more superficial and  filled then but not much different from last week. Similarly on the right no major change. Under illumination some debris on the wound surfaces therefore I used a #5 curette to debride the subcutaneous debris. This has a very gritty surface which is not obvious visually. Therefore further debridement  may be necessary. This will be both been mechanical in terms of topical dressing Electronic Signature(s) Signed: 04/24/2020 6:08:30 PM By: Linton Ham MD Entered By: Linton Ham on 04/24/2020 08:59:00 -------------------------------------------------------------------------------- Physician Orders Details Patient Name: Date of Service: [place], [place] 04/24/2020 8:00 [place] Record Number: [id number] Patient Account Number: [id number] Date of Birth/Sex: Treating RN: [birth year] (79 y.o. [name] Provider: [name]., [name] Other Clinician: Referring Provider: Treating Provider/Extender: [name], [name] in Treatment: 29 Verbal / Phone Orders: No Diagnosis Coding ICD-10 Coding Code Description [phone] Chronic venous hypertension (idiopathic) with inflammation of bilateral lower extremity I89.0 Lymphedema, not elsewhere classified L97.822 Non-pressure chronic ulcer of other part of left lower leg with fat layer exposed L97.812 Non-pressure chronic ulcer of other part of right lower leg with fat layer exposed S90.812D Abrasion, left foot, subsequent encounter Follow-up Appointments Return Appointment in 1 week. Dressing Change Frequency Other: - all wounds - change twice a week by home health Skin Barriers/Peri-Wound Care Moisturizing lotion Wound Cleansing May shower with protection. - use cast protectors on the days dressings are not changed. May shower and wash wound with soap and water. - with dressing changes only. Primary Wound Dressing Wound #1 Left,Lateral Lower Leg Iodoflex Wound #3 Right,Proximal,Lateral Lower  Leg Iodoflex Wound #4 Right,Distal,Lateral Lower Leg Iodoflex Wound #6 Left,Proximal,Lateral Lower Leg Iodoflex Secondary Dressing Dry Gauze - all wounds ABD pad - all wounds Other: - pad left medial first met head for protection. Edema Control 4 layer compression - Bilateral Avoid standing for long periods of time Elevate legs to the level of the heart or above for 30 minutes daily and/or when sitting, a frequency of: - throughout the day. [facility] for wound care. [facility] home health Electronic Signature(s) Signed: 04/24/2020 4:47:33 PM By: Levan Hurst RN, BSN Signed: 04/24/2020 6:08:30 PM By: Linton Ham MD Entered By: Levan Hurst on 04/24/2020 08:43:30 -------------------------------------------------------------------------------- Problem List Details Patient Name: Date of Service: [place], [place] 04/24/2020 8:00 [place] Record Number: [id number] Patient Account Number: [id number] Date of Birth/Sex: Treating RN: Apr 26, 1941 (79 y.o. 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/06/2020 No Yes L97.812 Non-pressure chronic ulcer of other part of right lower leg with fat layer 01/03/2020 No Yes exposed S90.812D Abrasion, left foot, subsequent encounter 02/07/2020 No Yes Inactive Problems ICD-10 Code Description Active Date Inactive Date L97.521 Non-pressure chronic ulcer of other part of left foot limited to breakdown of skin 10/11/2019  10/11/2019 Resolved Problems ICD-10 Code Description Active Date Resolved Date 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 10/11/2019 10/11/2019 Electronic Signature(s) Signed: 04/24/2020 6:08:30 PM By: Linton Ham MD Entered By: Linton Ham on 04/24/2020 08:49:55 -------------------------------------------------------------------------------- Progress Note Details Patient Name: Date of Service: [place] 04/24/2020 8:00 A M Medical Record Number: [id number] Patient Account Number: [id number] Date of Birth/Sex: Treating RN: Mar 03, 1941 (79 y.o. [name] Provider: [name]., [name] Other Clinician: Referring Provider: Treating Provider/Extender:  [name], [name] in Treatment: 28 Subjective History of Present Illness (HPI) ADMISSION 10/11/2019 This is a 79 year old man with a history of a severe cardiomyopathy with an ejection fraction of about 20%, chronic renal failure stage III. He is listed as a type II diabetic in epic although the patient denies this. He also has a history of PVD. He states for the last month he has had wounds on his bilateral lower extremities that started off as blisters which denuded. He has areas on the left lateral calf and 2 on the right lateral. He has an area on the left first met head which he did not know was there he we identified this on intake. He has been using Silvadene cream provided by his primary care physician but he is complaining that this burns. 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 He was  acutely ill at that time complicating GI bleed. ABIs in our clinic were 1.16 on the right and 1.13 on the left 2/25; the patient comes in with his areas on the left lateral and right lateral calf. There is also an area over the left first MTP bunion deformity. We have been using Sorbact. His edema control is fairly good 3/4; left lateral and right lateral calf. Most of his wounds are in the same position tightly adherent nonviable debris. On the right we debrided the superior wound on the left both wounds. The area on his bunion over the left first MTP medially is I think just about closed. We have been using Sorbact without a lot of success changed to Iodoflex under compression 3/11; left lateral and right lateral calf perhaps minor improvement in the surface condition. We have been using Iodoflex. The area over the bunion of the left first MTP has closed over 3/18; left lateral and right lateral calf not much improvement. We have been using Iodoflex. Aggressive debridement last week. 3/25; left lateral and right lateral calf. We have been using Iodoflex. There is some improvement in the superior area on the right and 2 on the lateral left although there is still a lot of debris on the surface. The inferior area on the right is still a completely nonviable surface. 4/8; left lateral and right lateral calfs. Essentially mirror-image looking wounds 2 wounds on each side in close juxtaposition we have been using Iodoflex with some improvement in the very adherent fibrinous debris but not a lot. The patient has arterial studies next Wednesday morning and venous studies next Thursday morning. I have been avoiding any further aggressive debridement until we see the arterial study results. His ABIs were fairly good in this clinic and is dorsalis pedis pulses are palpable but the wound beds are pale. 4/15; left lateral and right lateral calfs. Essentially mirror-image wounds with 2 wounds on each side in  close juxtaposition but with rims of separating normal tissue. We have been using Iodoflex. The base of the wounds has been cleaning up quite nicely ARTERIAL STUDIES were done showing the patient had an ABI on the right of 1.27 with triphasic waveforms and a TBI of 0.90 on the left triphasic waveforms with an ABI of 1.28  and a TBI of 0.87. No evidence of arterial disease VENOUS REFLUX STUDIES; were done yesterday we do not have this report yet. 4/23; VENOUS REFLUX STUDIES did not show any significant reflux right lower extremity. No evidence of a DVT He did have significant reflux in the common . femoral vein on the left but nothing else was listed as significant. He did not have a DVT. We have been using Iodoflex under compression his wounds are making progress. 4/29; improvements in the wound surface continue. We have been using Iodoflex. He has a 20% out-of-pocket co-pay for Apligraf which is unfortunate. We changed him to Sorbact today 5/6; started on Sorbact last week. Better looking wound surface but not much change in dimensions. 01/24/20-Patient is back at 3 weeks, wound surfaces are about the same but very minimal slough on the right leg wounds, however there is blistering on both legs adjacent to the wounds, patient denies any other symptoms or new symptoms. His studies have been reviewed and do not indicate any significant arterial or venous disease. But he is attending once in 3 weeks with home health changing in between 6/3; patient has 4 wounds 2 on each side of his lateral lower legs. These wounds are somewhat improved. He apparently arrived in clinic last week with a large blister on the right lateral lower leg that had denuded into the wound. They changed to silver alginate. Still under compression. He has straight Medicare and unfortunately has an unlimited co-pay for advanced treatment options. 6/10; his original 4 wounds are all better especially on the left lateral lower leg.  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 6/24; blister on the right lateral lower leg has healed. There is no additional wounds. The traumatic left second toe is also closed. The 4 that he has are all better 7/1; all of the patient's wounds are smaller except for the proximal one on the left lateral calf. We are using silver collagen 7/22; patient has home health. Relatively refractory wounds on the lateral part of both mid calfs. Each of them 2 wounds. The areas on the left are doing much better in fact the distal 1 looks like it is on his way to closure. The right required debridement with adherent fibrinous debris under illumination. We have been using silver collagen. Previous arterial studies were within normal limits he also had venous reflux studies 04/03/2020 upon evaluation today patient appears to be doing excellent with regard to his lower extremity ulcers. He is going to require a little bit of debridement on the wounds on the right especially in order to clear away some biofilm and slough but this is minimal and overall he seems to be doing quite well. 8/19; the patient's wounds on the left lateral lower leg just above closed. However the 2 on the lateral part of the right have a nonviable necrotic surface. This is disappointing. There is also no change in dimensions. He has had arterial studies as well as venous reflux studies 8/26; very disappointing today. Even the more superficial areas on the left are about the same as last week. Still 2 punched-out areas completely  unchanged on the right. We have been using silver collagen really making no  progress. Objective Constitutional Sitting or standing Blood Pressure is within target range for patient.. Pulse regular and within target range for patient.Marland Kitchen Respirations regular, non-labored and within target range.. Temperature is normal and within the target range for the patient.Marland Kitchen Appears in no distress. Vitals Time Taken: 7:52 AM, Height: 71 in, Source: Stated, Weight: 198 lbs, Source: Stated, BMI: 27.6, Temperature: 97.6 F, Pulse: 97 bpm, Respiratory Rate: 18 breaths/min, Blood Pressure: 118/71 mmHg. General Notes: Wound exam; the area on the left is much more superficial and filled then but not much different from last week. Similarly on the right no major change. Under illumination some debris on the wound surfaces therefore I used a #5 curette to debride the subcutaneous debris. This has a very gritty surface which is not obvious visually. Therefore further debridement may be necessary. This will be both been mechanical in terms of topical dressing Integumentary (Hair, Skin) Wound #1 status is Open. Original cause of wound was Blister. The wound is located on the Left,Lateral Lower Leg. The wound measures 0.8cm length x 0.7cm width x 0.1cm depth; 0.44cm^2 area and 0.044cm^3 volume. There is Fat Layer (Subcutaneous Tissue) exposed. There is no tunneling or undermining noted. There is a small amount of purulent drainage noted. The wound margin is flat and intact. There is large (67-100%) red granulation within the wound bed. There is no necrotic tissue within the wound bed. Wound #3 status is Open. Original cause of wound was Blister. The wound is located on the Right,Proximal,Lateral Lower Leg. The wound measures 1.6cm length x 1.3cm width x 0.3cm depth; 1.634cm^2 area and 0.49cm^3 volume. There is Fat Layer (Subcutaneous Tissue) exposed. There is no tunneling or undermining noted. There is a medium amount of purulent drainage noted. The wound margin is flat and intact. There is small  (1-33%) red granulation within the wound bed. There is a large (67-100%) amount of necrotic tissue within the wound bed including Adherent Slough. Wound #4 status is Open. Original cause of wound was Blister. The wound is located on the Right,Distal,Lateral Lower Leg. The wound measures 1.7cm length x 1.5cm width x 0.4cm depth; 2.003cm^2 area and 0.801cm^3 volume. There is Fat Layer (Subcutaneous Tissue) exposed. There is no tunneling or undermining noted. There is a medium amount of purulent drainage noted. The wound margin is flat and intact. There is small (1-33%) red granulation within the wound bed. There is a large (67-100%) amount of necrotic tissue within the wound bed including Adherent Slough. Wound #6 status is Open. Original cause of wound was Gradually Appeared. The wound is located on the Left,Proximal,Lateral Lower Leg. The wound measures 1.4cm length x 1cm width x 0.1cm depth; 1.1cm^2 area and 0.11cm^3 volume. There is Fat Layer (Subcutaneous Tissue) exposed. There is no tunneling or undermining noted. There is a small amount of serosanguineous drainage noted. The wound margin is flat and intact. There is large (67-100%) red granulation within the wound bed. There is no necrotic tissue within the wound bed. Assessment Active Problems ICD-10 Chronic venous hypertension (idiopathic) with inflammation of bilateral lower extremity Lymphedema, not elsewhere classified Non-pressure chronic ulcer of other part of left lower leg with fat layer exposed Non-pressure chronic ulcer of other part of right lower leg with fat layer exposed Abrasion, left foot, subsequent encounter Procedures Wound #1 Pre-procedure diagnosis of Wound #1 is a Venous Leg Ulcer located on the Left,Lateral Lower Leg .Severity of Tissue Pre Debridement  is: Fat layer exposed. There was a Excisional Skin/Subcutaneous Tissue Debridement with a total area of 0.56 sq cm performed by Ricard Dillon., MD. With the  following instrument(s): Curette to remove Viable and Non-Viable tissue/material. Material removed includes Subcutaneous Tissue and Slough and. No specimens were taken. A time out was conducted at 08:38, prior to the start of the procedure. A Minimum amount of bleeding was controlled with Pressure. The procedure was tolerated well with a pain level of 3 throughout and a pain level of 0 following the procedure. Post Debridement Measurements: 0.8cm length x 0.7cm width x 0.1cm depth; 0.044cm^3 volume. Character of Wound/Ulcer Post Debridement is improved. Severity of Tissue Post Debridement is: Fat layer exposed. Post procedure Diagnosis Wound #1: Same as Pre-Procedure Pre-procedure diagnosis of Wound #1 is a Venous Leg Ulcer located on the Left,Lateral Lower Leg . There was a Four Layer Compression Therapy Procedure by Levan Hurst, RN. Post procedure Diagnosis Wound #1: Same as Pre-Procedure Wound #3 Pre-procedure diagnosis of Wound #3 is a Venous Leg Ulcer located on the Right,Proximal,Lateral Lower Leg .Severity of Tissue Pre Debridement is: Fat layer exposed. There was a Excisional Skin/Subcutaneous Tissue Debridement with a total area of 2.08 sq cm performed by Ricard Dillon., MD. With the following instrument(s): Curette to remove Viable and Non-Viable tissue/material. Material removed includes Subcutaneous Tissue and Slough and. No specimens were taken. A time out was conducted at 08:38, prior to the start of the procedure. A Minimum amount of bleeding was controlled with Pressure. The procedure was tolerated well with a pain level of 3 throughout and a pain level of 0 following the procedure. Post Debridement Measurements: 1.6cm length x 1.3cm width x 0.3cm depth; 0.49cm^3 volume. Character of Wound/Ulcer Post Debridement is improved. Severity of Tissue Post Debridement is: Fat layer exposed. Post procedure Diagnosis Wound #3: Same as Pre-Procedure Pre-procedure diagnosis of Wound #3  is a Venous Leg Ulcer located on the Right,Proximal,Lateral Lower Leg . There was a Four Layer Compression Therapy Procedure by Levan Hurst, RN. Post procedure Diagnosis Wound #3: Same as Pre-Procedure Wound #4 Pre-procedure diagnosis of Wound #4 is a Venous Leg Ulcer located on the Right,Distal,Lateral Lower Leg .Severity of Tissue Pre Debridement is: Fat layer exposed. There was a Excisional Skin/Subcutaneous Tissue Debridement with a total area of 2.55 sq cm performed by Ricard Dillon., MD. With the following instrument(s): Curette to remove Viable and Non-Viable tissue/material. Material removed includes Subcutaneous Tissue and Slough and. No specimens were taken. A time out was conducted at 08:38, prior to the start of the procedure. A Minimum amount of bleeding was controlled with Pressure. The procedure was tolerated well with a pain level of 3 throughout and a pain level of 0 following the procedure. Post Debridement Measurements: 1.7cm length x 1.5cm width x 0.4cm depth; 0.801cm^3 volume. Character of Wound/Ulcer Post Debridement is improved. Severity of Tissue Post Debridement is: Fat layer exposed. 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 There was a Four Layer Compression Therapy Procedure by Levan Hurst, RN. Post procedure Diagnosis Wound #4: Same as Pre-Procedure Wound #6 Pre-procedure diagnosis of Wound #6 is a Venous Leg Ulcer located on the Left,Proximal,Lateral Lower Leg .Severity of Tissue Pre Debridement is: Fat layer exposed. There was a Excisional Skin/Subcutaneous Tissue Debridement with a total area of 1.4 sq cm performed by Ricard Dillon., MD. With the following instrument(s): Curette to  remove Viable and Non-Viable tissue/material. Material removed includes Subcutaneous Tissue and Slough and. No specimens were taken. A time out was  conducted at 08:38, prior to the start of the procedure. A Minimum amount of bleeding was controlled with Pressure. The procedure was tolerated well with a pain level of 3 throughout and a pain level of 0 following the procedure. Post Debridement Measurements: 1.4cm length x 1cm width x 0.1cm depth; 0.11cm^3 volume. Character of Wound/Ulcer Post Debridement is improved. Severity of Tissue Post Debridement is: Fat layer exposed. Post procedure Diagnosis Wound #6: Same as Pre-Procedure Pre-procedure diagnosis of Wound #6 is a Venous Leg Ulcer located on the Left,Proximal,Lateral Lower Leg . There was a Four Layer Compression Therapy Procedure by Levan Hurst, RN. Post procedure Diagnosis Wound #6: Same as Pre-Procedure Plan Follow-up Appointments: Return Appointment in 1 week. Dressing Change Frequency: Other: - all wounds - change twice a week by home health Skin Barriers/Peri-Wound Care: Moisturizing lotion Wound Cleansing: May shower with protection. - use cast protectors on the days dressings are not changed. May shower and wash wound with soap and water. - with dressing changes only. Primary Wound Dressing: Wound #1 Left,Lateral Lower Leg: Iodoflex Wound #3 Right,Proximal,Lateral Lower Leg: Iodoflex Wound #4 Right,Distal,Lateral Lower Leg: Iodoflex Wound #6 Left,Proximal,Lateral Lower Leg: Iodoflex Secondary Dressing: Dry Gauze - all wounds ABD pad - all wounds Other: - pad left medial first met head for protection. Edema Control: 4 layer compression - Bilateral Avoid standing for long periods of time Elevate legs to the level of the heart or above for 30 minutes daily and/or when sitting, a frequency of: - throughout the day. Home Health: [place] skilled nursing for wound care. - [facility] #1 I change the dressing back to Iodoflex to help with ongoing debridement 2. Surface of all the wounds is still not completely viable. Further mechanical  debridement therefore may be necessary. 3. He may needs to be maintained in compression therefore Santyl is not an option 4. He has an unaffordable co-pay for advanced treatment products. 5. Consider deep tissue culture Electronic Signature(s) Signed: 04/24/2020 6:08:30 PM By: Linton Ham MD Entered By: Linton Ham on 04/24/2020 09:00:29 -------------------------------------------------------------------------------- SuperBill Details Patient Name: Date of Service: [place], [place] 04/24/2020 Medical Record Number: [id number] Patient Account Number: [id number] Date of Birth/Sex: Treating RN: Jan 24, 1941 (79 y.o. [name] Provider: [name]., [name] Other Clinician: Referring Provider: Treating Provider/Extender: [name], [name] in Treatment: 28 Diagnosis Coding ICD-10 Codes Code Description [id number] Chronic venous hypertension (idiopathic) with inflammation of bilateral lower extremity I89.0 Lymphedema, not elsewhere classified L97.822 Non-pressure chronic ulcer of other part of left lower leg with fat layer exposed L97.812 Non-pressure chronic ulcer of other part of right lower leg with fat layer exposed S90.812D Abrasion, left foot, subsequent encounter Facility Procedures The patient participates with Medicare or their insurance follows the Medicare Facility Guidelines: CPT4 Code Description Modifier Quantity [id number] 11042 - DEB SUBQ TISSUE 20 SQ CM/< 1 ICD-10 Diagnosis Description L97.822 Non-pressure chronic ulcer of  other part of left lower leg with fat layer exposed L97.812 Non-pressure chronic ulcer of other part of right lower leg with fat layer exposed Physician Procedures : CPT4 Code Description Modifier [id number] 11042 - WC PHYS SUBQ TISS 20 SQ CM ICD-10 Diagnosis Description L97.822 Non-pressure chronic ulcer of other part of left lower leg with fat layer exposed L97.812 Non-pressure chronic ulcer of other part of  right  lower leg with fat layer exposed Quantity: 1 Electronic Signature(s) Signed: 04/24/2020 6:08:30 PM By: Linton Ham MD Entered By: Linton Ham on 04/24/2020 09:00:47

## 2020-04-26 NOTE — Progress Notes (Signed)
Troy, Patton ([id number]) Visit Report for 04/17/2020 Arrival Information Details Patient Name: Date of Service: [name], [name] 04/17/2020 8:00 [name] Record Number: [id number] Patient Account Number: [id number] Date of Birth/Sex: Treating RN: Apr 18, 1941 (79 y.o. [name] Primary Care Provider: Frederik Patton., [name] Other Clinician: Referring Provider: Treating Provider/Extender: [name]., [name] in Treatment: 27 Visit Information History Since Last Visit Added or deleted any medications: No Patient Arrived: Cane Any new allergies or adverse reactions: No Arrival Time: 08:02 Had a fall or experienced change in No Accompanied By: self activities of daily living that may affect Transfer Assistance: None risk of falls: Patient Identification Verified: Yes Signs or symptoms of abuse/neglect since last visito No Secondary Verification Process Completed: Yes Hospitalized since last visit: No Patient Requires Transmission-Based Precautions: No Implantable device outside of the clinic excluding No Patient Has Alerts: Yes cellular tissue based products placed in the center Patient Alerts: Patient on Blood Thinner since last visit: Has Dressing in Place as Prescribed: Yes Has Compression in Place as Prescribed: Yes Pain Present Now: No Electronic Signature(s) Signed: 04/17/2020 5:22:44 PM By: [name] Entered By: [name] on 04/17/2020 08:12:14 -------------------------------------------------------------------------------- Compression Therapy Details Patient Name: Date of Service: [name] 04/17/2020 8:00 [name] Record Number: [id number] Patient Account Number: [id number] Date of Birth/Sex: Treating RN: March 24, 1941 (79 y.o. [name] Primary Care Provider: Frederik Patton., [name] Other Clinician: Referring Provider: Treating Provider/Extender: [name]., [name] in  Treatment: 27 Compression Therapy Performed for Wound Assessment: Wound #1 Left,Lateral Lower Leg Performed By: Clinician Troy Hurst, RN Compression Type: Four Layer Post Procedure Diagnosis Same as Pre-procedure Electronic Signature(s) Signed: 04/17/2020 5:36:57 PM By: Troy Hurst RN, BSN Entered By: [name] on 04/17/2020 08:59:06 -------------------------------------------------------------------------------- Compression Therapy Details Patient Name: Date of Service: [name] 04/17/2020 8:00 [name] Record Number: [id number] Patient Account Number: [id number] Date of Birth/Sex: Treating RN: April 16, 1941 (79 y.o. [name] Primary Care Provider: Frederik Patton., [name] Other Clinician: Referring Provider: Treating Provider/Extender: [name]., [name] in Treatment: 27 Compression Therapy Performed for Wound Assessment: Wound #3 Right,Proximal,Lateral Lower Leg Performed By: Clinician Troy Hurst, RN Compression Type: Four Layer Post Procedure Diagnosis Same as Pre-procedure Electronic Signature(s) Signed: 04/17/2020 5:36:57 PM By: Troy Hurst RN, BSN Entered By: [name] on 04/17/2020 08:59:06 -------------------------------------------------------------------------------- Compression Therapy Details Patient Name: Date of Service: [name], [name] 04/17/2020 8:00 [name] Record Number: [id number] Patient Account Number: [id number] Date of Birth/Sex: Treating RN: [name] (79 y.o. [name] Primary Care Provider: Frederik Patton., [name] Other Clinician: Referring Provider: Treating Provider/Extender: [name]., [name] in Treatment: 27 Compression Therapy Performed for Wound Assessment: Wound #4 Right,Distal,Lateral Lower Leg Performed By: Clinician Troy Hurst, RN Compression Type: Four Layer Post Procedure Diagnosis Same as Pre-procedure Electronic  Signature(s) Signed: 04/17/2020 5:36:57 PM By: Troy Hurst RN, BSN Entered By: [name] on 04/17/2020 08:59:06 -------------------------------------------------------------------------------- Compression Therapy Details Patient Name: Date of Service: [name] 04/17/2020 8:00 [name] Record Number: [id number] Patient Account Number: [id number] Date of Birth/Sex: Treating RN: [birth year] (80 y.o. [name] Primary Care Provider: Frederik Patton., [name] Other Clinician: Referring Provider: Treating Provider/Extender: [name]., [name] in Treatment: 27 Compression Therapy  Performed for Wound Assessment: Wound #6 Left,Proximal,Lateral Lower Leg Performed By: Clinician Troy Hurst, RN Compression Type: Four Layer Post Procedure Diagnosis Same as Pre-procedure Electronic Signature(s) Signed: 04/17/2020 5:36:57 PM By: Troy Hurst RN, BSN Entered By: [name] on 04/17/2020 08:59:06 -------------------------------------------------------------------------------- Encounter Discharge Information Details Patient Name: Date of Service: [name], [name] 04/17/2020 8:00 [name] Number: [id number] Patient Account Number: [id number] Date of Birth/Sex: Treating RN: [birth year] (78 y.o. [name]) [name] Primary Care Provider: Frederik Patton., [name] Other Clinician: Referring Provider: Treating Provider/Extender: [name]., [name] in Treatment: 27 Encounter Discharge Information Items Post Procedure Vitals Discharge Condition: Stable Temperature (F): 97.8 Ambulatory Status: Cane Pulse (bpm): 98 Discharge Destination: Home Respiratory Rate (breaths/min): 19 Transportation: Private Auto Blood Pressure (mmHg): 126/72 Accompanied By: self Schedule Follow-up Appointment: No Clinical Summary of Care: Electronic Signature(s) Signed: 04/25/2020 5:50:16 PM By: Troy Coria RN Entered By: [name] on 04/17/2020 09:30:27 -------------------------------------------------------------------------------- Lower Extremity Assessment Details Patient Name: Date of Service: [place] [name], [name] 04/17/2020 8:00 [name] Record Number: [id number] Patient Account Number: [id number] Date of Birth/Sex: Treating RN: [birth year] (80 y.o. [name] Primary Care Provider: Frederik Patton., [name] Other Clinician: Referring Provider: Treating Provider/Extender: [name]., [name] in Treatment: 27 Edema Assessment Assessed: [Left: No] [Right: No] Edema: [Left: Yes] [Right: Yes] Calf Left: Right: Point of Measurement: 43 cm From Medial Instep 40 cm 35.5 cm Ankle Left: Right: Point of Measurement: 14 cm From Medial Instep 22 cm 21.5 cm Vascular Assessment Pulses: Dorsalis Pedis Palpable: [Left:No] [Right:No] Electronic Signature(s) Signed: 04/17/2020 5:22:44 PM By: [name] Entered By: [name] on 04/17/2020 08:15:20 -------------------------------------------------------------------------------- Multi Wound Chart Details Patient Name: Date of Service: [place] [name] [place] 04/17/2020 8:00 A M Medical Record Number: [id number] Patient Account Number: [id number] Date of Birth/Sex: Treating RN: December 13, 1940 (79 y.o. [name] Primary Care Provider: Frederik Patton., [name] Other Clinician: Referring Provider: Treating Provider/Extender: [name]., [name] in Treatment: 27 Vital Signs Height(in): 71 Pulse(bpm): 98 Weight(lbs): 198 Blood Pressure(mmHg): 126/72 Body Mass Index(BMI): 28 Temperature(F): 97.8 Respiratory Rate(breaths/min): 19 Photos: [1:No Photos Left, Lateral Lower Leg] [3:No Photos Right, Proximal, Lateral Lower Leg] [4:No Photos Right, Distal, Lateral Lower Leg] Wound Location: [1:Blister] [3:Blister] [4:Blister] Wounding Event: [1:Venous Leg Ulcer] [3:Venous Leg Ulcer] [4:Venous Leg  Ulcer] Primary Etiology: [1:Anemia, Congestive Heart Failure,] [3:Anemia, Congestive Heart Failure,] [4:Anemia, Congestive Heart Failure,] Comorbid History: [1:Hypertension, Peripheral Venous Disease, End Stage Renal Disease Disease, End Stage Renal Disease Disease, 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] [3:Hypertension, Peripheral Venous 09/11/2019] [4:Hypertension, Peripheral Venous 09/11/2019] Date Acquired: [1:27] [3:27] [4:27] Weeks of Treatment: [1:Open] [3:Open] [4:Open] Wound Status: [1:Yes] [3:No] [4:No] Clustered Wound: [1:1] [3:N/A] [4:N/A] Clustered Quantity: [1:0.8x0.6x0.1] [3:1.7x0.9x0.2] [4:1.6x1.3x0.2] Measurements L x W x D (cm) [1:0.377] [3:1.202] [4:1.634] A (cm) : rea [1:0.038] [3:0.24] [4:0.327] Volume (cm) : [1:99.40%] [3:75.70%] [4:82.40%] % Reduction in A [1:rea: 99.40%] [3:51.50%] [4:64.80%] % Reduction in Volume: [1:Full Thickness Without Exposed] [3:Full Thickness Without Exposed] [4:Full Thickness Without Exposed] Classification: [1:Support Structures Medium] [3:Support Structures Medium] [4:Support Structures Medium] Exudate A mount: [1:Serosanguineous] [3:Serosanguineous] [4:Serosanguineous] Exudate Type: [1:red, brown] [3:red, brown] [4:red, brown] Exudate Color: [1:Distinct, outline attached] [3:Distinct, outline attached] [4:Distinct, outline attached] Wound Margin: [1:Large (67-100%)] [3:Medium (34-66%)] [4:Medium (34-66%)] Granulation A mount: [1:Red, Pink] [3:Red] [4:Red] Granulation Quality: [1:Small (1-33%)] [3:Medium (34-66%)] [4:Medium (34-66%)] Necrotic A mount: [1:Fat Layer (  Subcutaneous Tissue): Yes Fat Layer (Subcutaneous Tissue): Yes Fat Layer (Subcutaneous Tissue): Yes] Exposed Structures: [1:Fascia: No Tendon: No Muscle: No Joint: No Bone: No Small (1-33%)] [3:Fascia: No Tendon: No Muscle: No Joint: No Bone: No Small (1-33%)] [4:Fascia: No Tendon: No Muscle: No Joint: No Bone: No Small (1-33%)] Epithelialization: [1:N/A] [3:Debridement - Excisional]  [4:Debridement - Excisional] Debridement: Pre-procedure Verification/Time Out N/A [3:08:55] [4:08:55] Taken: [1:N/A] [3:Subcutaneous, Slough] [4:Subcutaneous, Slough] Tissue Debrided: [1:N/A] [3:Skin/Subcutaneous Tissue] [4:Skin/Subcutaneous Tissue] Level: [1:N/A] [3:1.53] [4:2.08] Debridement A (sq cm): [1:rea N/A] [3:Curette] [4:Curette] Instrument: [1:N/A] [3:Minimum] [4:Minimum] Bleeding: [1:N/A] [3:Pressure] [4:Pressure] Hemostasis A chieved: [1:N/A] [3:3] [4:3] Procedural Pain: [1:N/A] [3:0] [4:0] Post Procedural Pain: [1:N/A] [3:Procedure was tolerated well] [4:Procedure was tolerated well] Debridement Treatment Response: [1:N/A] [3:1.7x0.9x0.2] [4:1.6x1.3x0.2] Post Debridement Measurements L x W x D (cm) [1:N/A] [3:0.24] [4:0.327] Post Debridement Volume: (cm) [1:Compression Therapy] [3:Compression Therapy] [4:Compression Therapy] Procedures Performed: [3:Debridement 6 N/A] [4:Debridement N/A] Photos: [1:No Photos Left, Proximal, Lateral Lower Leg] [3:N/A N/A] [4:N/A N/A] Wound Location: [1:Gradually Appeared] [3:N/A] [4:N/A] Wounding Event: [1:Venous Leg Ulcer] [3:N/A] [4:N/A] Primary Etiology: [1:Anemia, Congestive Heart Failure,] [3:N/A] [4:N/A] Comorbid History: [1:Hypertension, Peripheral Venous Disease, End Stage Renal Disease 12/13/2019] [3:N/A] [4:N/A] Date A cquired: [1:18] [3:N/A] [4:N/A] Weeks of Treatment: [1:Open] [3:N/A] [4:N/A] Wound Status: [1:No] [3:N/A] [4:N/A] Clustered Wound: [1:N/A] [3:N/A] [4:N/A] Clustered Quantity: [1:0.9x0.3x0.1] [3:N/A] [4:N/A] Measurements L x W x D (cm) [1:0.212] [3:N/A] [4:N/A] A (cm) : rea [1:0.021] [3:N/A] [4:N/A] Volume (cm) : [1:93.60%] [3:N/A] [4:N/A] % Reduction in A [1:rea: 97.90%] [3:N/A] [4:N/A] % Reduction in Volume: [1:Full Thickness Without Exposed] [3:N/A] [4:N/A] Classification: [1:Support Structures Medium] [3:N/A] [4:N/A] Exudate A mount: [1:Serosanguineous] [3:N/A] [4:N/A] Exudate Type: [1:red, brown] [3:N/A]  [4:N/A] Exudate Color: [1:Distinct, outline attached] [3:N/A] [4:N/A] Wound Margin: [1:Large (67-100%)] [3:N/A] [4:N/A] Granulation A mount: [1:Red, Pink] [3:N/A] [4:N/A] Granulation Quality: [1:Small (1-33%)] [3:N/A] [4:N/A] Necrotic A mount: [1:Fat Layer (Subcutaneous Tissue): Yes N/A] [4:N/A] Exposed Structures: [1:Fascia: No Tendon: No Muscle: No Joint: No Bone: No Small (1-33%)] [3:N/A] [4:N/A] Epithelialization: [1:N/A] [3:N/A] [4:N/A] Debridement: [1:N/A] [3:N/A] [4:N/A] Tissue Debrided: [1:N/A] [3:N/A] [4:N/A] Level: [1:N/A] [3:N/A] [4:N/A] Debridement A (sq cm): [1:rea N/A] [3:N/A] [4:N/A] Instrument: [1:N/A] [3:N/A] [4:N/A] Bleeding: [1:N/A] [3:N/A] [4:N/A] Hemostasis A chieved: [1:N/A] [3:N/A] [4:N/A] Procedural Pain: [1:N/A] [3:N/A] [4:N/A] Post Procedural Pain: Debridement Treatment Response: N/A [3:N/A] [4:N/A] Post Debridement Measurements L x N/A [3:N/A] [4:N/A] W x D (cm) [1:N/A] [3:N/A] [4:N/A] Post Debridement Volume: (cm) [1:Compression Therapy] [3:N/A] [4:N/A] Treatment Notes Electronic Signature(s) Signed: 04/17/2020 5:36:57 PM By: Troy Hurst RN, BSN Signed: 04/17/2020 5:57:44 PM By: Linton Ham MD Entered By: Linton Ham on 04/17/2020 09:03:05 -------------------------------------------------------------------------------- Multi-Disciplinary Care Plan Details Patient Name: Date of Service: [place] Pat[place] 04/17/2020 8:00 A M Medical Record Number: [id number] Patient Account Number: [id number] Date of Birth/Sex: Treating RN: 1941/05/06 (79 y.o. [name] Primary Care Provider: Frederik Patton., [name] Other Clinician: Referring Provider: Treating Provider/Extender: [name]., [name] in Treatment: 27 Active Inactive Wound/Skin Impairment Nursing Diagnoses: Knowledge deficit related to ulceration/compromised skin integrity Goals: Patient/caregiver will verbalize understanding of skin care regimen Date  Initiated: 10/11/2019 Target Resolution Date: 05/02/2020 Goal Status: Active Interventions: Assess patient/caregiver ability to obtain necessary supplies Assess patient/caregiver ability to perform ulcer/skin care regimen upon admission and as needed Assess ulceration(s) every visit Provide education on ulcer and skin care Treatment Activities: Skin care regimen initiated : 10/11/2019 Topical wound management initiated : 10/11/2019 Notes: Electronic Signature(s) Signed: 04/17/2020 5:36:57 PM By: Troy Hurst  RN, BSN Entered By: [name] on 04/17/2020 08:55:10 -------------------------------------------------------------------------------- Pain Assessment Details Patient Name: Date of Service: [place], [place] 04/17/2020 8:00 [place] Record Number: [id number] Patient Account Number: [id number] Date of Birth/Sex: Treating RN: February 12, 1941 (79 y.o. [name] Primary Care Provider: Frederik Patton., [name] Other Clinician: Referring Provider: Treating Provider/Extender: [name]., [name] in Treatment: 27 Active Problems Location of Pain Severity and Description of Pain Patient Has Paino No Site Locations Pain Management and Medication Current Pain Management: Electronic Signature(s) Signed: 04/17/2020 5:22:44 PM By: [name] Entered By: [name] on 04/17/2020 08:14:58 -------------------------------------------------------------------------------- Patient/Caregiver Education Details Patient Name: Date of Service: [place] [facility] [address] 8/19/2021andnbsp8:00 A M Medical Record Number: [id number] Patient Account Number: [id number] Date of Birth/Gender: Treating RN: 05-20-1941 (79 y.o. [name] Primary Care Physician: [name]., [name] Other Clinician: Referring Physician: Treating Physician/Extender: [name]., [name] in Treatment: 27 Education Assessment Education Provided  To: Patient Education Topics Provided Wound/Skin Impairment: Methods: Explain/Verbal Responses: State content correctly Electronic Signature(s) Signed: 04/17/2020 5:36:57 PM By: Troy Hurst RN, BSN Entered By: [name] on 04/17/2020 09:04:43 -------------------------------------------------------------------------------- Wound Assessment Details Patient Name: Date of Service: [place] 04/17/2020 8:00 [place] Record Number: [id number] Patient Account Number: [id number] Date of Birth/Sex: Treating RN: 1940-12-23 (79 y.o. [name] Primary Care Provider: Frederik Patton., [name] Other Clinician: Referring Provider: Treating Provider/Extender: [name]., [name] in Treatment: 27 Wound Status Wound Number: 1 Primary Venous Leg Ulcer Etiology: Wound Location: Left, Lateral Lower Leg Wound Open Wounding Event: Blister Status: Date Acquired: 09/11/2019 Comorbid Anemia, Congestive Heart Failure, Hypertension, Peripheral Weeks Of Treatment: 27 History: Venous Disease, End Stage Renal Disease Clustered Wound: Yes Photos Photo Uploaded By: Mikeal Hawthorne on 04/21/2020 14:35:02 Wound Measurements Length: (cm) 0.8 Width: (cm) Depth: (cm) Clustered Quantity: Area: (cm) Volume: (cm) % Reduction in Area: 99.4% 0.6 % Reduction in Volume: 99.4% 0.1 Epithelialization: Small (1-33%) 1 Tunneling: No 0.377 Undermining: No 0.038 Wound Description Classification: Full Thickness Without Exposed Support Structures Wound Margin: Distinct, outline attached Exudate Amount: Medium Exudate Type: Serosanguineous Exudate Color: red, brown Foul Odor After Cleansing: No Slough/Fibrino Yes Wound Bed Granulation Amount: Large (67-100%) Exposed Structure Granulation Quality: Red, Pink Fascia Exposed: No Necrotic Amount: Small (1-33%) Fat Layer (Subcutaneous Tissue) Exposed: Yes Necrotic Quality: Adherent Slough Tendon Exposed:  No Muscle Exposed: No Joint Exposed: No Bone Exposed: No Electronic Signature(s) Signed: 04/17/2020 5:22:44 PM By: [name] Entered By: [name] on 04/17/2020 08:15:52 -------------------------------------------------------------------------------- Wound Assessment Details Patient Name: Date of Service: [place], [place] 04/17/2020 8:00 A M Medical Record Number: [id number] Patient Account Number: [id number] Date of Birth/Sex: Treating RN: Aug 22, 1941 (79 y.o. [name] Primary Care Provider: Frederik Patton., [name] Other Clinician: Referring Provider: Treating Provider/Extender: [name]., [name] in Treatment: 27 Wound Status Wound Number: 3 Primary Venous Leg Ulcer Etiology: Wound Location: Right, Proximal, Lateral Lower Leg Wound Open Wounding Event: Blister Status: Date Acquired: 09/11/2019 Comorbid Anemia, Congestive Heart Failure, Hypertension, Peripheral Weeks Of Treatment: 27 History: Venous Disease, End Stage Renal Disease Clustered Wound: No Photos Photo Uploaded By: Mikeal Hawthorne on 04/21/2020 14:34:38 Wound Measurements Length: (cm) 1.7 Width: (cm) 0.9 Depth: (cm) 0.2 Area: (cm) 1.202 Volume: (cm) 0.24 Wound Description Classification: Full Thickness Without Exposed Support Structu Wound Margin:  Distinct, outline attached Exudate Amount: Medium Exudate Type: Serosanguineous Exudate Color: red, brown Foul Odor After Cleansing: Slough/Fibrino % Reduction in Area: 75.7% % Reduction in Volume: 51.5% Epithelialization: Small (1-33%) Tunneling: No Undermining: No res No Yes Wound Bed Granulation Amount: Medium (34-66%) Exposed Structure Granulation Quality: Red Fascia Exposed: No Necrotic Amount: Medium (34-66%) Fat Layer (Subcutaneous Tissue) Exposed: Yes Necrotic Quality: Adherent Slough Tendon Exposed: No Muscle Exposed: No Joint Exposed: No Bone Exposed: No Electronic  Signature(s) Signed: 04/17/2020 5:22:44 PM By: [name] Entered By: [name] on 04/17/2020 08:16:21 -------------------------------------------------------------------------------- Wound Assessment Details Patient Name: Date of Service: [place] 04/17/2020 8:00 A M Medical Record Number: [id number] Patient Account Number: [id number] Date of Birth/Sex: Treating RN: August 11, 1941 (79 y.o. [name] Primary Care Provider: Frederik Patton., [name] Other Clinician: Referring Provider: Treating Provider/Extender: [name]., [name] in Treatment: 27 Wound Status Wound Number: 4 Primary Venous Leg Ulcer Etiology: Wound Location: Right, Distal, Lateral Lower Leg Wound Open Wounding Event: Blister Status: Date Acquired: 09/11/2019 Comorbid Anemia, Congestive Heart Failure, Hypertension, Peripheral Weeks Of Treatment: 27 History: Venous Disease, End Stage Renal Disease Clustered Wound: No Photos Photo Uploaded By: Mikeal Hawthorne on 04/21/2020 14:34:38 Wound Measurements Length: (cm) 1.6 Width: (cm) 1.3 Depth: (cm) 0.2 Area: (cm) 1.634 Volume: (cm) 0.327 % Reduction in Area: 82.4% % Reduction in Volume: 64.8% Epithelialization: Small (1-33%) Tunneling: No Undermining: No Wound Description Classification: Full Thickness Without Exposed Support Structures Wound Margin: Distinct, outline attached Exudate Amount: Medium Exudate Type: Serosanguineous Exudate Color: red, brown Wound Bed Granulation Amount: Medium (34-66%) Granulation Quality: Red Necrotic Amount: Medium (34-66%) Necrotic Quality: Adherent Slough Foul Odor After Cleansing: No Slough/Fibrino Yes Exposed Structure Fascia Exposed: No Fat Layer (Subcutaneous Tissue) Exposed: Yes Tendon Exposed: No Muscle Exposed: No Joint Exposed: No Bone Exposed: No Electronic Signature(s) Signed: 04/17/2020 5:22:44 PM By: [name] Entered By: [name]  on 04/17/2020 08:16:44 -------------------------------------------------------------------------------- Wound Assessment Details Patient Name: Date of Service: [place], [place] 04/17/2020 8:00 A M Medical Record Number: [id number] Patient Account Number: [id number] Date of Birth/Sex: Treating RN: [birth year] (79 y.o. [name] Primary Care Provider: Frederik Patton., [name] Other Clinician: Referring Provider: Treating Provider/Extender: [name]., [name] in Treatment: 27 Wound Status Wound Number: 6 Primary Venous Leg Ulcer Etiology: Wound Location: Left, Proximal, Lateral Lower Leg Wound Open Wounding Event: Gradually Appeared Status: Date Acquired: 12/13/2019 Comorbid Anemia, Congestive Heart Failure, Hypertension, Peripheral Weeks Of Treatment: 18 History: Venous Disease, End Stage Renal Disease Clustered Wound: No Photos Photo Uploaded By: Mikeal Hawthorne on 04/21/2020 14:35:03 Wound Measurements Length: (cm) 0.9 Width: (cm) 0.3 Depth: (cm) 0.1 Area: (cm) 0.212 Volume: (cm) 0.021 % Reduction in Area: 93.6% % Reduction in Volume: 97.9% Epithelialization: Small (1-33%) Tunneling: No Undermining: No Wound Description Classification: Full Thickness Without Exposed Support Structures Wound Margin: Distinct, outline attached Exudate Amount: Medium Exudate Type: Serosanguineous Exudate Color: red, brown Foul Odor After Cleansing: No Slough/Fibrino Yes Wound Bed Granulation Amount: Large (67-100%) Exposed Structure Granulation Quality: Red, Pink Fascia Exposed: No Necrotic Amount: Small (1-33%) Fat Layer (Subcutaneous Tissue) Exposed: Yes Necrotic Quality: Adherent Slough Tendon Exposed: No Muscle Exposed: No Joint Exposed: No Bone Exposed: No Electronic Signature(s) Signed: 04/17/2020 5:22:44 PM By: [name] Entered By: [name] on 04/17/2020  08:18:04 -------------------------------------------------------------------------------- Vitals Details Patient Name: Date of Service: [place], [place] 04/17/2020 8:00 A M Medical Record Number:  [id number] Patient Account Number: [id number] Date of Birth/Sex: Treating RN: [birth year] (79 y.o. [name] Primary Care Provider: Frederik Patton., [name] Other Clinician: Referring Provider: Treating Provider/Extender: [name]., [name] in Treatment: 27 Vital Signs Time Taken: 08:14 Temperature (F): 97.8 Height (in): 71 Pulse (bpm): 98 Weight (lbs): 198 Respiratory Rate (breaths/min): 19 Body Mass Index (BMI): 27.6 Blood Pressure (mmHg): 126/72 Reference Range: 80 - 120 mg / dl Electronic Signature(s) Signed: 04/17/2020 5:22:44 PM By: [name] Entered By: [name] on 04/17/2020 08:14:52

## 2020-04-26 NOTE — Progress Notes (Signed)
DEMECO, DUCKSWORTH ([id number]) Visit Report for 04/24/2020 Arrival Information Details Patient Name: Date of Service: [place], [place] 04/24/2020 8:00 A M Medical Record Number: [id number] Patient Account Number: [id number] Date of Birth/Sex: Treating RN: 08/17/1941 (79 y.o. [name] Primary Care Yohanna Tow: [name]., [name] Other Clinician: Referring Hollis Tuller: Treating Devita Nies/Extender: [name], [name] in Treatment: 65 Visit Information History Since Last Visit Added or deleted any medications: No Patient Arrived: Cane Any new allergies or adverse reactions: No Arrival Time: 07:49 Had a fall or experienced change in No Accompanied By: self activities of daily living that may affect Transfer Assistance: None risk of falls: Patient Identification Verified: Yes Signs or symptoms of abuse/neglect since last visito No Secondary Verification Process Completed: Yes Hospitalized since last visit: No Patient Requires Transmission-Based Precautions: No Implantable device outside of the clinic excluding No Patient Has Alerts: Yes cellular tissue based products placed in the center Patient Alerts: Patient on Blood Thinner since last visit: Has Dressing in Place as Prescribed: Yes Has Compression in Place as Prescribed: Yes Pain Present Now: No Electronic Signature(s) Signed: 04/25/2020 6:00:28 PM By: Baruch Gouty RN, BSN Entered By: Baruch Gouty on 04/24/2020 07:49:50 -------------------------------------------------------------------------------- Compression Therapy Details Patient Name: Date of Service: [place], [place] 04/24/2020 8:00 [place] Record Number: [id number] Patient Account Number: [id number] Date of Birth/Sex: Treating RN: 04/08/1941 (79 y.o. [name] Juana Montini: [name]., [name] Other Clinician: Referring Pernella Ackerley: Treating Reymundo Winship/Extender: [name], [name] in  Treatment: 28 Compression Therapy Performed for Wound Assessment: Wound #1 Left,Lateral Lower Leg Performed By: Clinician Levan Hurst, RN Compression Type: Four Layer Post Procedure Diagnosis Same as Pre-procedure Electronic Signature(s) Signed: 04/24/2020 4:47:33 PM By: Levan Hurst RN, BSN Entered By: Levan Hurst on 04/24/2020 08:43:05 -------------------------------------------------------------------------------- Compression Therapy Details Patient Name: Date of Service: [place] 04/24/2020 8:00 [place] Record Number: [id number] Patient Account Number: [id number] Date of Birth/Sex: Treating RN: 09-Oct-1940 (79 y.o. [name] Dain Laseter: [name]., [name] Other Clinician: Referring Adryel Wortmann: Treating Duron Meister/Extender: [name], [name] in Treatment: 28 Compression Therapy Performed for Wound Assessment: Wound #3 Right,Proximal,Lateral Lower Leg Performed By: Clinician Levan Hurst, RN Compression Type: Four Layer Post Procedure Diagnosis Same as Pre-procedure Electronic Signature(s) Signed: 04/24/2020 4:47:33 PM By: Levan Hurst RN, BSN Entered By: Levan Hurst on 04/24/2020 08:43:05 -------------------------------------------------------------------------------- Compression Therapy Details Patient Name: Date of Service: [place], [place] 04/24/2020 8:00 [place] Record Number: [id number] Patient Account Number: [id number] Date of Birth/Sex: Treating RN: Jan 15, 1941 (79 y.o. [name] Trustin Chapa: [name]., [name] Other Clinician: Referring Johnnathan Hagemeister: Treating Derionna Salvador/Extender: [name], [name] in Treatment: 28 Compression Therapy Performed for Wound Assessment: Wound #4 Right,Distal,Lateral Lower Leg Performed By: Clinician Levan Hurst, RN Compression Type: Four Layer Post Procedure Diagnosis Same as Pre-procedure Electronic  Signature(s) Signed: 04/24/2020 4:47:33 PM By: Levan Hurst RN, BSN Entered By: Levan Hurst on 04/24/2020 08:43:05 -------------------------------------------------------------------------------- Compression Therapy Details Patient Name: Date of Service: [place] 04/24/2020 8:00 A M Medical Record Number: [id number] Patient Account Number: [id number] Date of Birth/Sex: Treating RN: 1941/04/20 (79 y.o. [name] Leilene Diprima: [name]., [name] Other Clinician: Referring Franziska Podgurski: Treating Brenn Deziel/Extender: [name], [name] in Treatment: 28  Compression Therapy Performed for Wound Assessment: Wound #6 Left,Proximal,Lateral Lower Leg Performed By: Clinician Levan Hurst, RN Compression Type: Four Layer Post Procedure Diagnosis Same as Pre-procedure Electronic Signature(s) Signed: 04/24/2020 4:47:33 PM By: Levan Hurst RN, BSN Entered By: Levan Hurst on 04/24/2020 08:43:05 -------------------------------------------------------------------------------- Encounter Discharge Information Details Patient Name: Date of Service: [place], [place] 04/24/2020 8:00 [place] Record Number: [id number] Patient Account Number: [id number] Date of Birth/Sex: Treating RN: 1941/03/20 (79 y.o. [name] Primary Care Beverly Ferner: [name]., [name] Other Clinician: Referring Haylie Mccutcheon: Treating Lori Popowski/Extender: [name], [name] in Treatment: 28 Encounter Discharge Information Items Post Procedure Vitals Discharge Condition: Stable Temperature (F): 97.6 Ambulatory Status: Cane Pulse (bpm): 97 Discharge Destination: Home Respiratory Rate (breaths/min): 18 Transportation: Private Auto Blood Pressure (mmHg): 118/71 Accompanied By: self Schedule Follow-up Appointment: Yes Clinical Summary of Care: Patient Declined Electronic Signature(s) Signed: 04/25/2020 6:00:28 PM By: Baruch Gouty  RN, BSN Entered By: Baruch Gouty on 04/24/2020 09:15:10 -------------------------------------------------------------------------------- Lower Extremity Assessment Details Patient Name: Date of Service: [place] 04/24/2020 8:00 [place] Record Number: [id number] Patient Account Number: [id number] Date of Birth/Sex: Treating RN: 1941/08/21 (79 y.o. [name] Primary Care Dawnyel Leven: [name]., [name] Other Clinician: Referring Yarrow Linhart: Treating Mylia Pondexter/Extender: [name], [name] in Treatment: 28 Edema Assessment Assessed: [Left: No] [Right: No] Edema: [Left: Yes] [Right: Yes] Calf Left: Right: Point of Measurement: 43 cm From Medial Instep 34.5 cm 35 cm Ankle Left: Right: Point of Measurement: 14 cm From Medial Instep 24.4 cm 23.7 cm Vascular Assessment Pulses: Dorsalis Pedis Palpable: [Left:No] [Right:No] Electronic Signature(s) Signed: 04/25/2020 6:00:28 PM By: Baruch Gouty RN, BSN Entered By: Baruch Gouty on 04/24/2020 08:03:31 -------------------------------------------------------------------------------- Multi Wound Chart Details Patient Name: Date of Service: [place] 04/24/2020 8:00 A M Medical Record Number: [id number] Patient Account Number: [id number] Date of Birth/Sex: Treating RN: 1940/09/04 (79 y.o. [name] Aanika Defoor: [name]., [name] Other Clinician: Referring Kevina Piloto: Treating Winfred Redel/Extender: [name], [name] in Treatment: 28 Vital Signs Height(in): 71 Pulse(bpm): 97 Weight(lbs): 198 Blood Pressure(mmHg): 118/71 Body Mass Index(BMI): 28 Temperature(F): 97.6 Respiratory Rate(breaths/min): 18 Photos: [1:No Photos Left, Lateral Lower Leg] [3:No Photos Right, Proximal, Lateral Lower Leg] [4:No Photos Right, Distal, Lateral Lower Leg] Wound Location: [1:Blister] [3:Blister] [4:Blister] Wounding Event: [1:Venous Leg Ulcer]  [3:Venous Leg Ulcer] [4:Venous Leg Ulcer] Primary Etiology: [1:Anemia, Congestive Heart Failure,] [3:Anemia, Congestive Heart Failure,] [4:Anemia, Congestive Heart Failure,] Comorbid History: [1:Hypertension, Peripheral Venous Disease, End Stage Renal Disease Disease, End Stage Renal Disease Disease, 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] [3:Hypertension, Peripheral Venous 09/11/2019] [4:Hypertension, Peripheral Venous 09/11/2019] Date Acquired: [1:28] [3:28] [4:28] Weeks of Treatment: [1:Open] [3:Open] [4:Open] Wound Status: [1:Yes] [3:No] [4:No] Clustered Wound: [1:1] [3:N/A] [4:N/A] Clustered Quantity: [1:0.8x0.7x0.1] [3:1.6x1.3x0.3] [4:1.7x1.5x0.4] Measurements L x W x D (cm) [1:0.44] [3:1.634] [4:2.003] A (cm) : rea [1:0.044] [3:0.49] [4:0.801] Volume (cm) : [1:99.30%] [3:67.00%] [4:78.50%] % Reduction in A [1:rea: 99.30%] [3:1.00%] [4:13.90%] % Reduction in Volume: [1:Full Thickness Without Exposed] [3:Full Thickness Without Exposed] [4:Full Thickness Without Exposed] Classification: [1:Support Structures Small] [3:Support Structures Medium] [4:Support Structures Medium] Exudate A mount: [1:Purulent] [3:Purulent] [4:Purulent] Exudate Type: [1:yellow, brown, green] [3:yellow, brown, green] [4:yellow, brown, green] Exudate Color: [1:Flat and Intact] [3:Flat and Intact] [4:Flat and Intact] Wound Margin: [1:Large (67-100%)] [3:Small (1-33%)] [4:Small (1-33%)] Granulation A mount: [1:Red] [3:Red] [4:Red] Granulation Quality: [1:None Present (  0%)] [3:Large (67-100%)] [4:Large (67-100%)] Necrotic A mount: [1:Fat Layer (Subcutaneous Tissue): Yes Fat Layer (Subcutaneous Tissue): Yes Fat Layer (Subcutaneous Tissue): Yes] Exposed Structures: [1:Fascia: No Tendon: No Muscle: No Joint: No Bone: No Medium (34-66%)] [3:Fascia: No Tendon: No Muscle: No Joint: No Bone: No Small (1-33%)] [4:Fascia: No Tendon: No Muscle: No Joint: No Bone: No Small (1-33%)] Epithelialization: [1:Debridement - Excisional]  [3:Debridement - Excisional] [4:Debridement - Excisional] Debridement: Pre-procedure Verification/Time Out 08:38 [3:08:38] [4:08:38] Taken: [1:Subcutaneous, Slough] [3:Subcutaneous, Slough] [4:Subcutaneous, Slough] Tissue Debrided: [1:Skin/Subcutaneous Tissue] [3:Skin/Subcutaneous Tissue] [4:Skin/Subcutaneous Tissue] Level: [1:0.56] [3:2.08] [4:2.55] Debridement A (sq cm): [1:rea Curette] [3:Curette] [4:Curette] Instrument: [1:Minimum] [3:Minimum] [4:Minimum] Bleeding: [1:Pressure] [3:Pressure] [4:Pressure] Hemostasis A chieved: [1:3] [3:3] [4:3] Procedural Pain: [1:0] [3:0] [4:0] Post Procedural Pain: [1:Procedure was tolerated well] [3:Procedure was tolerated well] [4:Procedure was tolerated well] Debridement Treatment Response: [1:0.8x0.7x0.1] [3:1.6x1.3x0.3] [4:1.7x1.5x0.4] Post Debridement Measurements L x W x D (cm) [1:0.044] [3:0.49] [4:0.801] Post Debridement Volume: (cm) [1:Compression Therapy] [3:Compression Therapy] [4:Compression Therapy] Procedures Performed: [1:Debridement 6] [3:Debridement N/A] [4:Debridement N/A] Photos: [1:No Photos Left, Proximal, Lateral Lower Leg] [3:N/A N/A] [4:N/A N/A] Wound Location: [1:Gradually Appeared] [3:N/A] [4:N/A] Wounding Event: [1:Venous Leg Ulcer] [3:N/A] [4:N/A] Primary Etiology: [1:Anemia, Congestive Heart Failure,] [3:N/A] [4:N/A] Comorbid History: [1:Hypertension, Peripheral Venous Disease, End Stage Renal Disease 12/13/2019] [3:N/A] [4:N/A] Date Acquired: [1:19] [3:N/A] [4:N/A] Weeks of Treatment: [1:Open] [3:N/A] [4:N/A] Wound Status: [1:No] [3:N/A] [4:N/A] Clustered Wound: [1:N/A] [3:N/A] [4:N/A] Clustered Quantity: [1:1.4x1x0.1] [3:N/A] [4:N/A] Measurements L x W x D (cm) [1:1.1] [3:N/A] [4:N/A] A (cm) : rea [1:0.11] [3:N/A] [4:N/A] Volume (cm) : [1:67.00%] [3:N/A] [4:N/A] % Reduction in A [1:rea: 89.00%] [3:N/A] [4:N/A] % Reduction in Volume: [1:Full Thickness Without Exposed] [3:N/A] [4:N/A] Classification: [1:Support  Structures Small] [3:N/A] [4:N/A] Exudate A mount: [1:Serosanguineous] [3:N/A] [4:N/A] Exudate Type: [1:red, brown] [3:N/A] [4:N/A] Exudate Color: [1:Flat and Intact] [3:N/A] [4:N/A] Wound Margin: [1:Large (67-100%)] [3:N/A] [4:N/A] Granulation A mount: [1:Red] [3:N/A] [4:N/A] Granulation Quality: [1:None Present (0%)] [3:N/A] [4:N/A] Necrotic A mount: [1:Fat Layer (Subcutaneous Tissue): Yes N/A] [4:N/A] Exposed Structures: [1:Fascia: No Tendon: No Muscle: No Joint: No Bone: No Small (1-33%)] [3:N/A] [4:N/A] Epithelialization: [1:Debridement - Excisional] [3:N/A] [4:N/A] Debridement: Pre-procedure Verification/Time Out 08:38 [3:N/A] [4:N/A] Taken: [1:Subcutaneous, Slough] [3:N/A] [4:N/A] Tissue Debrided: [1:Skin/Subcutaneous Tissue] [3:N/A] [4:N/A] Level: [1:1.4] [3:N/A] [4:N/A] Debridement A (sq cm): [1:rea Curette] [3:N/A] [4:N/A] Instrument: [1:Minimum] [3:N/A] [4:N/A] Bleeding: [1:Pressure] [3:N/A] [4:N/A] Hemostasis A chieved: [1:3] [3:N/A] [4:N/A] Procedural Pain: [1:0] [3:N/A] [4:N/A] Post Procedural Pain: [1:Procedure was tolerated well] [3:N/A] [4:N/A] Debridement Treatment Response: [1:1.4x1x0.1] [3:N/A] [4:N/A] Post Debridement Measurements L x W x D (cm) [1:0.11] [3:N/A] [4:N/A] Post Debridement Volume: (cm) [1:Compression Therapy] [3:N/A] [4:N/A] Procedures Performed: [1:Debridement] Treatment Notes Electronic Signature(s) Signed: 04/24/2020 4:47:33 PM By: Levan Hurst RN, BSN Signed: 04/24/2020 6:08:30 PM By: Linton Ham MD Entered By: Linton Ham on 04/24/2020 08:50:02 -------------------------------------------------------------------------------- Multi-Disciplinary Care Plan Details Patient Name: Date of Service: [place], [place] 04/24/2020 8:00 A M Medical Record Number: [id number] Patient Account Number: [id number] Date of Birth/Sex: Treating RN: [birth year] (79 y.o. [name] Porschea Borys: [name]., [name] Other  Clinician: Referring Teddie Curd: Treating Daphnee Preiss/Extender: [name], [name] in Treatment: 28 Active Inactive Wound/Skin Impairment Nursing Diagnoses: Knowledge deficit related to ulceration/compromised skin integrity Goals: Patient/caregiver will verbalize understanding of skin care regimen Date Initiated: 10/11/2019 Target Resolution Date: 05/02/2020 Goal Status: Active Interventions: Assess patient/caregiver ability to obtain necessary supplies Assess patient/caregiver ability to perform ulcer/skin care regimen upon admission and as needed Assess  ulceration(s) every visit Provide education on ulcer and skin care Treatment Activities: Skin care regimen initiated : 10/11/2019 Topical wound management initiated : 10/11/2019 Notes: Electronic Signature(s) Signed: 04/24/2020 4:47:33 PM By: Levan Hurst RN, BSN Entered By: Levan Hurst on 04/24/2020 07:46:22 -------------------------------------------------------------------------------- Pain Assessment Details Patient Name: Date of Service: [place] 04/24/2020 8:00 [place] Record Number: [id number] Patient Account Number: [id number] Date of Birth/Sex: Treating RN: [birth year] (79 y.o. [name] Primary Care Vidalia Serpas: [name]., [name] Other Clinician: Referring Elek Holderness: Treating [place]/Extender: [name], [name] in Treatment: 28 Active Problems Location of Pain Severity and Description of Pain Patient Has Paino No Site Locations Rate the pain. Current Pain Level: 0 Character of Pain Describe the Pain: Tender Pain Management and Medication Current Pain Management: Electronic Signature(s) Signed: 04/25/2020 6:00:28 PM By: Baruch Gouty RN, BSN Entered By: Baruch Gouty on 04/24/2020 08:02:10 -------------------------------------------------------------------------------- Patient/Caregiver Education Details Patient Name: Date of  Service: [place] 8/26/2021andnbsp8:00 A M Medical Record Number: [id number] Patient Account Number: [id number] Date of Birth/Gender: Treating RN: October 31, 1940 (79 y.o. [name] Physician: [name]., [name] Other Clinician: Referring Physician: Treating Physician/Extender: [name], [name] in Treatment: 17 Education Assessment Education Provided To: Patient Education Topics Provided Wound/Skin Impairment: Methods: Explain/Verbal Responses: State content correctly Electronic Signature(s) Signed: 04/24/2020 4:47:33 PM By: Levan Hurst RN, BSN Entered By: Levan Hurst on 04/24/2020 07:46:32 -------------------------------------------------------------------------------- Wound Assessment Details Patient Name: Date of Service: [place] 04/24/2020 8:00 [place] Record Number: [id number] Patient Account Number: [id number] Date of Birth/Sex: Treating RN: Dec 25, 1940 (79 y.o. [name] Primary Care Ray Glacken: [name]., [name] Other Clinician: Referring Markeshia Giebel: Treating Mishael Haran/Extender: [name], [name] in Treatment: 28 Wound Status Wound Number: 1 Primary Venous Leg Ulcer Etiology: Wound Location: Left, Lateral Lower Leg Wound Open Wounding Event: Blister Status: Date Acquired: 09/11/2019 Comorbid Anemia, Congestive Heart Failure, Hypertension, Peripheral Weeks Of Treatment: 28 History: Venous Disease, End Stage Renal Disease Clustered Wound: Yes Photos Photo Uploaded By: Mikeal Hawthorne on 04/25/2020 11:44:25 Wound Measurements Length: (cm) Width: (cm) Depth: (cm) Clustered Quantity: Area: (cm) Volume: (cm) 0.8 % Reduction in Area: 99.3% 0.7 % Reduction in Volume: 99.3% 0.1 Epithelialization: Medium (34-66%) 1 Tunneling: No 0.44 Undermining: No 0.044 Wound Description Classification: Full Thickness Without Exposed Support Structures Wound  Margin: Flat and Intact Exudate Amount: Small Exudate Type: Purulent Exudate Color: yellow, brown, green Foul Odor After Cleansing: No Slough/Fibrino No Wound Bed Granulation Amount: Large (67-100%) Exposed Structure Granulation Quality: Red Fascia Exposed: No Necrotic Amount: None Present (0%) Fat Layer (Subcutaneous Tissue) Exposed: Yes Tendon Exposed: No Muscle Exposed: No Joint Exposed: No Bone Exposed: No Treatment Notes Wound #1 (Left, Lateral Lower Leg) 2. Periwound Care Moisturizing lotion 3. Primary Dressing Applied Iodoflex 4. Secondary Dressing ABD Pad 6. Support Layer Applied 4 layer compression Water quality scientist) Signed: 04/25/2020 6:00:28 PM By: Baruch Gouty RN, BSN Entered By: Baruch Gouty on 04/24/2020 08:09:58 -------------------------------------------------------------------------------- Wound Assessment Details Patient Name: Date of Service: [place] 04/24/2020 8:00 A M Medical Record Number: [id number] Patient Account Number: [id number] Date of Birth/Sex: Treating RN: [birth year] (79 y.o. [name] Primary Care Captain Blucher: [name]., [name] Other Clinician: Referring Cassell Voorhies: Treating Akhil Piscopo/Extender: [name], [name] in Treatment: 28 Wound Status Wound Number:  3 Primary Venous Leg Ulcer Etiology: Wound Location: Right, Proximal, Lateral Lower Leg Wound Open Wounding Event: Blister Status: Date Acquired: 09/11/2019 Comorbid Anemia, Congestive Heart Failure, Hypertension, Peripheral Weeks Of Treatment: 28 History: Venous Disease, End Stage Renal Disease Clustered Wound: No Photos Photo Uploaded By: Mikeal Hawthorne on 04/25/2020 11:43:44 Wound Measurements Length: (cm) 1.6 Width: (cm) 1.3 Depth: (cm) 0.3 Area: (cm) 1.634 Volume: (cm) 0.49 % Reduction in Area: 67% % Reduction in Volume: 1% Epithelialization: Small (1-33%) Tunneling: No Undermining: No Wound  Description Classification: Full Thickness Without Exposed Support Structures Wound Margin: Flat and Intact Exudate Amount: Medium Exudate Type: Purulent Exudate Color: yellow, brown, green Foul Odor After Cleansing: No Slough/Fibrino Yes Wound Bed Granulation Amount: Small (1-33%) Exposed Structure Granulation Quality: Red Fascia Exposed: No Necrotic Amount: Large (67-100%) Fat Layer (Subcutaneous Tissue) Exposed: Yes Necrotic Quality: Adherent Slough Tendon Exposed: No Muscle Exposed: No Joint Exposed: No Bone Exposed: No Treatment Notes Wound #3 (Right, Proximal, Lateral Lower Leg) 2. Periwound Care Moisturizing lotion 3. Primary Dressing Applied Iodoflex 4. Secondary Dressing ABD Pad 6. Support Layer Applied 4 layer compression Water quality scientist) Signed: 04/25/2020 6:00:28 PM By: Baruch Gouty RN, BSN Entered By: Baruch Gouty on 04/24/2020 08:10:20 -------------------------------------------------------------------------------- Wound Assessment Details Patient Name: Date of Service: [place] 04/24/2020 8:00 A M Medical Record Number: [id number] Patient Account Number: [id number] Date of Birth/Sex: Treating RN: Aug 26, 1941 (79 y.o. [name] Primary Care Ahria Slappey: [name]., [name] Other Clinician: Referring Javiana Anwar: Treating Lennix Kneisel/Extender: [name], [name] in Treatment: 28 Wound Status Wound Number: 4 Primary Venous Leg Ulcer Etiology: Etiology: Wound Location: Right, Distal, Lateral Lower Leg Wound Open Wounding Event: Blister Status: Date Acquired: 09/11/2019 Comorbid Anemia, Congestive Heart Failure, Hypertension, Peripheral Weeks Of Treatment: 28 History: Venous Disease, End Stage Renal Disease Clustered Wound: No Photos Photo Uploaded By: Mikeal Hawthorne on 04/25/2020 11:43:45 Wound Measurements Length: (cm) 1.7 Width: (cm) 1.5 Depth: (cm) 0.4 Area: (cm) 2.003 Volume: (cm)  0.801 % Reduction in Area: 78.5% % Reduction in Volume: 13.9% Epithelialization: Small (1-33%) Tunneling: No Undermining: No Wound Description Classification: Full Thickness Without Exposed Support Structures Wound Margin: Flat and Intact Exudate Amount: Medium Exudate Type: Purulent Exudate Color: yellow, brown, green Foul Odor After Cleansing: No Slough/Fibrino Yes Wound Bed Granulation Amount: Small (1-33%) Exposed Structure Granulation Quality: Red Fascia Exposed: No Necrotic Amount: Large (67-100%) Fat Layer (Subcutaneous Tissue) Exposed: Yes Necrotic Quality: Adherent Slough Tendon Exposed: No Muscle Exposed: No Joint Exposed: No Bone Exposed: No Treatment Notes Wound #4 (Right, Distal, Lateral Lower Leg) 2. Periwound Care Moisturizing lotion 3. Primary Dressing Applied Iodoflex 4. Secondary Dressing ABD Pad 6. Support Layer Applied 4 layer compression Water quality scientist) Signed: 04/25/2020 6:00:28 PM By: Baruch Gouty RN, BSN Entered By: Baruch Gouty on 04/24/2020 08:10:43 -------------------------------------------------------------------------------- Wound Assessment Details Patient Name: Date of Service: [place] 04/24/2020 8:00 A M Medical Record Number: [id number] Patient Account Number: [id number] Date of Birth/Sex: Treating RN: [birth year] (79 y.o. [name] Primary Care Lyndia Bury: Other Clinician: [name]., [name] Referring Nicoya Friel: Treating Nakaila Freeze/Extender: [name], [name] in Treatment: 28 Wound Status Wound Number: 6 Primary Venous Leg Ulcer Etiology: Wound Location: Left, Proximal, Lateral Lower Leg Wound Open Wounding Event: Gradually Appeared Status: Date Acquired: 12/13/2019 Comorbid Anemia, Congestive Heart Failure, Hypertension, Peripheral Weeks Of Treatment: 19 History: Venous Disease, End Stage Renal Disease Clustered Wound: No Photos Photo Uploaded  ByMikeal Hawthorne on 04/25/2020 11:44:26 Wound Measurements Length: (cm) 1.4 Width: (cm) 1 Depth: (cm) 0.1 Area: (cm) 1.1 Volume: (cm) 0.11 % Reduction in Area: 67% % Reduction in Volume: 89% Epithelialization: Small (1-33%) Tunneling: No Undermining: No Wound Description Classification: Full Thickness Without Exposed Support Structures Wound Margin: Flat and Intact Exudate Amount: Small Exudate Type: Serosanguineous Exudate Color: red, brown Foul Odor After Cleansing: No Slough/Fibrino Yes Wound Bed Granulation Amount: Large (67-100%) Exposed Structure Granulation Quality: Red Fascia Exposed: No Necrotic Amount: None Present (0%) Fat Layer (Subcutaneous Tissue) Exposed: Yes Tendon Exposed: No Muscle Exposed: No Joint Exposed: No Bone Exposed: No Treatment Notes Wound #6 (Left, Proximal, Lateral Lower Leg) 2. Periwound Care Moisturizing lotion 3. Primary Dressing Applied Iodoflex 4. Secondary Dressing ABD Pad 6. Support Layer Applied 4 layer compression Water quality scientist) Signed: 04/25/2020 6:00:28 PM By: Baruch Gouty RN, BSN Entered By: Baruch Gouty on 04/24/2020 08:11:00 -------------------------------------------------------------------------------- Vitals Details Patient Name: Date of Service: [place], [place] 04/24/2020 8:00 Craig Record Number: [id number] Patient Account Number: [id number] Date of Birth/Sex: Treating RN: 04-17-1941 (79 y.o. [name] Primary Care Obert Espindola: [name]., [name] Other Clinician: Referring Rileyann Florance: Treating Briahnna Harries/Extender: [name], [name] in Treatment: 28 Vital Signs Time Taken: 07:52 Temperature (F): 97.6 Height (in): 71 Pulse (bpm): 97 Source: Stated Respiratory Rate (breaths/min): 18 Weight (lbs): 198 Blood Pressure (mmHg): 118/71 Source: Stated Reference Range: 80 - 120 mg / dl Body Mass Index (BMI): 27.6 Electronic Signature(s) Signed: 04/25/2020  6:00:28 PM By: Baruch Gouty RN, BSN Entered By: Baruch Gouty on 04/24/2020 07:52:54

## 2020-05-01 ENCOUNTER — Encounter (HOSPITAL_BASED_OUTPATIENT_CLINIC_OR_DEPARTMENT_OTHER): Payer: Medicare Other | Attending: Internal Medicine | Admitting: Internal Medicine

## 2020-05-01 DIAGNOSIS — L97812 Non-pressure chronic ulcer of other part of right lower leg with fat layer exposed: Secondary | ICD-10-CM | POA: Diagnosis not present

## 2020-05-01 DIAGNOSIS — R079 Chest pain, unspecified: Secondary | ICD-10-CM | POA: Diagnosis not present

## 2020-05-01 DIAGNOSIS — I429 Cardiomyopathy, unspecified: Secondary | ICD-10-CM | POA: Insufficient documentation

## 2020-05-01 DIAGNOSIS — K921 Melena: Secondary | ICD-10-CM | POA: Insufficient documentation

## 2020-05-01 DIAGNOSIS — I87333 Chronic venous hypertension (idiopathic) with ulcer and inflammation of bilateral lower extremity: Secondary | ICD-10-CM | POA: Insufficient documentation

## 2020-05-01 DIAGNOSIS — L97822 Non-pressure chronic ulcer of other part of left lower leg with fat layer exposed: Secondary | ICD-10-CM | POA: Insufficient documentation

## 2020-05-01 DIAGNOSIS — I890 Lymphedema, not elsewhere classified: Secondary | ICD-10-CM | POA: Insufficient documentation

## 2020-05-01 DIAGNOSIS — I509 Heart failure, unspecified: Secondary | ICD-10-CM | POA: Insufficient documentation

## 2020-05-02 NOTE — Progress Notes (Signed)
INMAN, FETTIG ([id number]) Visit Report for 05/01/2020 HPI Details Patient Name: Date of Service: [place], [place] 05/01/2020 8:00 [place] Record Number: [id number] Patient Account Number: [id number] Date of Birth/Sex: Treating RN: Dec 03, 1940 (79 y.o. [name] Provider: [name]., [name] Other Clinician: Referring Provider: Treating Provider/Extender: [name], [name] in Treatment: 29 History of Present Illness HPI Description: ADMISSION 10/11/2019 This is a 79 year old man with a history of a severe cardiomyopathy with an ejection fraction of about 20%, chronic renal failure stage III. He is listed as a type II diabetic in epic although the patient denies this. He also has a history of PVD. He states for the last month he has had wounds on his bilateral lower extremities that started off as blisters which denuded. He has areas on the left lateral calf and 2 on the right lateral. He has an area on the left first met head which he did not know was there he we identified this on intake. He has been using Silvadene cream provided by his primary care physician but he is complaining that this burns. 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 He was acutely ill at that time complicating GI bleed. ABIs in our clinic were 1.16 on the right and 1.13 on the left 2/25; the patient comes in with his areas on the left lateral and right lateral calf. There is also an area over the left first MTP bunion deformity. We have been using Sorbact. His edema control is fairly good 3/4; left lateral and  right lateral calf. Most of his wounds are in the same position tightly adherent nonviable debris. On the right we debrided the superior wound on the left both wounds. The area on his bunion over the left first MTP medially is I think just about closed. We have been using Sorbact without a lot of success changed to Iodoflex under compression 3/11; left lateral and right lateral calf perhaps minor improvement in the surface condition. We have been using Iodoflex. The area over the bunion of the left first MTP has closed over 3/18; left lateral and right lateral calf not much improvement. We have been using Iodoflex. Aggressive debridement last week. 3/25; left lateral and right lateral calf. We have been using Iodoflex. There is some improvement in the superior area on the right and 2 on the lateral left although there is still a lot of debris on the surface. The inferior area on the right is still a completely nonviable surface. 4/8; left lateral and right lateral calfs. Essentially mirror-image looking wounds 2 wounds on each side in close juxtaposition we have been using Iodoflex with some improvement in the very adherent fibrinous debris but not a lot. The patient has arterial studies next Wednesday morning and venous studies next Thursday morning. I have been avoiding any further aggressive debridement until we see the arterial study results. His ABIs were fairly good in this clinic and is dorsalis pedis pulses are palpable but the wound beds are pale. 4/15; left lateral and right lateral calfs. Essentially mirror-image wounds with 2 wounds on each side in close juxtaposition  but with rims of separating normal tissue. We have been using Iodoflex. The base of the wounds has been cleaning up quite nicely ARTERIAL STUDIES were done showing the patient had an ABI on the right of 1.27 with triphasic waveforms and a TBI of 0.90 on the left triphasic waveforms with an ABI of 1.28 and a TBI of 0.87. No  evidence of arterial disease VENOUS REFLUX STUDIES; were done yesterday we do not have this report yet. 4/23; VENOUS REFLUX STUDIES did not show any significant reflux right lower extremity. No evidence of a DVT He did have significant reflux in the common . femoral vein on the left but nothing else was listed as significant. He did not have a DVT. We have been using Iodoflex under compression his wounds are making progress. 4/29; improvements in the wound surface continue. We have been using Iodoflex. He has a 20% out-of-pocket co-pay for Apligraf which is unfortunate. We changed him to Sorbact today 5/6; started on Sorbact last week. Better looking wound surface but not much change in dimensions. 01/24/20-Patient is back at 3 weeks, wound surfaces are about the same but very minimal slough on the right leg wounds, however there is blistering on both legs adjacent to the wounds, patient denies any other symptoms or new symptoms. His studies have been reviewed and do not indicate any significant arterial or venous disease. But he is attending once in 3 weeks with home health changing in between 6/3; patient has 4 wounds 2 on each side of his lateral lower legs. These wounds are somewhat improved. He apparently arrived in clinic last week with a large blister on the right lateral lower leg that had denuded into the wound. They changed to silver alginate. Still under compression. He has straight Medicare and unfortunately has an unlimited co-pay for advanced treatment options. 6/10; his original 4 wounds are all better especially on the left lateral lower leg. 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 6/24; blister on the right lateral lower leg has healed. There is no additional wounds. The traumatic left second toe is also closed. The 4 that he has are all better 7/1; all of the patient's wounds are smaller except for the proximal one on the left lateral calf. We are using silver collagen 7/22; patient has home health. Relatively refractory wounds on the lateral part of both mid calfs. Each of them 2 wounds. The areas on the left are doing much better in fact the distal 1 looks like it is on his way to closure. The right required debridement with adherent fibrinous debris under illumination. We have been using silver collagen. Previous arterial studies were within normal limits he also had venous reflux studies 04/03/2020 upon evaluation today patient appears to be doing excellent with regard to his lower extremity ulcers. He is going to require a little bit of debridement on the wounds on the right especially in order to clear away some biofilm and slough but this is minimal and overall he seems to be doing quite well. 8/19; the patient's wounds on the left lateral lower leg just above closed. However the 2  on the lateral part of the right have a nonviable necrotic surface. This is disappointing. There is also no change in dimensions. He has had arterial studies as well as venous reflux studies 8/26; very disappointing today. Even the more superficial areas on the left are about the same as last week. Still 2 punched-out areas completely unchanged on the right. We have been using silver collagen really making no progress. 9/2; changed to Iodoflex last week better looking wound surfaces especially on the right although they are deep and punched out. The areas on the left are more superficial open 1 of these is almost fully epithelialized although it has been this way for at least 2 weeks. He has a 20% co-pay [Medicare only] unaffordable for a skin  substitute. Had some thought about a snap VAC on the deep areas on the right leg we will try to put this through in the insurance Electronic Signature(s) Signed: 05/01/2020 5:13:24 PM By: Baltazar Najjar MD Entered By: Baltazar Najjar on 05/01/2020 08:22:55 -------------------------------------------------------------------------------- Physical Exam Details Patient Name: Date of Service: [place] [facility] 05/01/2020 8:00 A M Medical Record Number: [id number] Patient Account Number: [id number] Date of Birth/Sex: Treating RN: [birth year] (79 y.o. Elizebeth Koller Primary Care Provider: Marcelline Mates., [name] Other Clinician: Referring Provider: Treating Provider/Extender: [name] Blase., [name] in Treatment: 29 Constitutional Sitting or standing Blood Pressure is within target range for patient.. Pulse regular and within target range for patient.Marland Kitchen Respirations regular, non-labored and within target range.. Temperature is normal and within the target range for the patient.Marland Kitchen Appears in no distress. Cardiovascular Pedal pulses are palpable bilaterally. Notes Wound exam; the area on the left is superficial but not much better than last week. The area on the right more punched out but I did a debridement last week probably accounting for some of this. Tissue looks better with the Iodoflex. There is no evidence of surrounding infection. Edema control is excellent Electronic Signature(s) Signed: 05/01/2020 5:13:24 PM By: Baltazar Najjar MD Entered By: Baltazar Najjar on 05/01/2020 08:23:48 -------------------------------------------------------------------------------- Physician Orders Details Patient Name: Date of Service: [place] [facility] [facility] 05/01/2020 8:00 A M Medical Record Number: [id number] Patient Account Number: [id number] Date of Birth/Sex: Treating RN: January 30, 1941 (79 y.o. Damaris Schooner Primary Care Provider: Marcelline Mates., [name] Other Clinician: Referring  Provider: Treating Provider/Extender: [name] Blase., [name] in Treatment: 29 Verbal / Phone Orders: No Diagnosis Coding ICD-10 Coding Code Description [phone] Chronic venous hypertension (idiopathic) with inflammation of bilateral lower extremity I89.0 Lymphedema, not elsewhere classified L97.822 Non-pressure chronic ulcer of other part of left lower leg with fat layer exposed L97.812 Non-pressure chronic ulcer of other part of right lower leg with fat layer exposed S90.812D Abrasion, left foot, subsequent encounter Follow-up Appointments Return Appointment in 1 week. Dressing Change Frequency Other: - all wounds - change twice a week by home health Skin Barriers/Peri-Wound Care Moisturizing lotion Wound Cleansing May shower with protection. - use cast protectors on the days dressings are not changed. May shower and wash wound with soap and water. - with dressing changes only. Primary Wound Dressing Wound #1 Left,Lateral Lower Leg Iodoflex Wound #3 Right,Proximal,Lateral Lower Leg Iodoflex Wound #4 Right,Distal,Lateral Lower Leg Iodoflex Wound #6 Left,Proximal,Lateral Lower Leg Iodoflex Secondary Dressing Dry Gauze - all wounds ABD pad - all wounds Other: - pad left medial first met head for protection. Edema Control 4 layer compression - Bilateral Avoid standing  for long periods of time Elevate legs to the level of the heart or above for 30 minutes daily and/or when sitting, a frequency of: - throughout the day. [facility] for wound care. [facility] home [facility] Electronic Signature(s) Signed: 05/01/2020 5:13:24 PM By: Linton Ham MD Signed: 05/02/2020 2:45:56 PM By: Baruch Gouty RN, BSN Entered By: Baruch Gouty on 05/01/2020 08:20:01 -------------------------------------------------------------------------------- Problem List Details Patient Name: Date of Service: [place] 05/01/2020 8:00 [place] Number: [id number] Patient Account Number: [id number] Date of Birth/Sex: Treating RN: [birth year] (79 y.o. 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/06/2020 No Yes L97.812 Non-pressure chronic ulcer of other part of right lower leg with fat layer 01/03/2020 No Yes exposed S90.812D Abrasion, left foot, subsequent encounter 02/07/2020 No Yes Inactive Problems ICD-10 Code Description Active Date Inactive Date L97.521 Non-pressure chronic ulcer of other part of left foot limited to breakdown of skin 10/11/2019 10/11/2019 Resolved Problems ICD-10 Code Description Active Date Resolved Date 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 10/11/2019 10/11/2019 Electronic Signature(s) Signed: 05/01/2020 5:13:24 PM By: Linton Ham MD Entered By: Linton Ham on 05/01/2020 08:21:47 -------------------------------------------------------------------------------- Progress Note Details Patient Name: Date of Service: [place] 05/01/2020 8:00 [place] Record Number: [id number] Patient Account Number: [id number] Date of Birth/Sex: Treating RN: Oct 29, 1940 (79 y.o. [name] Provider: [name]., [name] Other Clinician: Referring Provider: Treating Provider/Extender: [name], [name] in Treatment: 29 Subjective History of Present Illness (HPI) ADMISSION 10/11/2019 This is a 79 year old man with a history of a  severe cardiomyopathy with an ejection fraction of about 20%, chronic renal failure stage III. He is listed as a type II diabetic in epic although the patient denies this. He also has a history of PVD. He states for the last month he has had wounds on his bilateral lower extremities that started off as blisters which denuded. He has areas on the left lateral calf and 2 on the right lateral. He has an area on the left first met head which he did not know was there he we identified this on intake. He has been using Silvadene cream provided by his primary care physician but he is complaining that this burns. 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 He was acutely ill at that time complicating GI bleed. ABIs in our clinic were 1.16 on the right and 1.13 on the left 2/25; the patient comes in with his areas on the left lateral and right lateral calf. There is also an area over the left first MTP bunion deformity. We have been using Sorbact. His edema control is fairly good 3/4; left lateral and right  lateral calf. Most of his wounds are in the same position tightly adherent nonviable debris. On the right we debrided the superior wound on the left both wounds. The area on his bunion over the left first MTP medially is I think just about closed. We have been using Sorbact without a lot of success changed to Iodoflex under compression 3/11; left lateral and right lateral calf perhaps minor improvement in the surface condition. We have been using Iodoflex. The area over the bunion of the left first MTP has closed over 3/18; left lateral and right lateral  calf not much improvement. We have been using Iodoflex. Aggressive debridement last week. 3/25; left lateral and right lateral calf. We have been using Iodoflex. There is some improvement in the superior area on the right and 2 on the lateral left although there is still a lot of debris on the surface. The inferior area on the right is still a completely nonviable surface. 4/8; left lateral and right lateral calfs. Essentially mirror-image looking wounds 2 wounds on each side in close juxtaposition we have been using Iodoflex with some improvement in the very adherent fibrinous debris but not a lot. The patient has arterial studies next Wednesday morning and venous studies next Thursday morning. I have been avoiding any further aggressive debridement until we see the arterial study results. His ABIs were fairly good in this clinic and is dorsalis pedis pulses are palpable but the wound beds are pale. 4/15; left lateral and right lateral calfs. Essentially mirror-image wounds with 2 wounds on each side in close juxtaposition but with rims of separating normal tissue. We have been using Iodoflex. The base of the wounds has been cleaning up quite nicely ARTERIAL STUDIES were done showing the patient had an ABI on the right of 1.27 with triphasic waveforms and a TBI of 0.90 on the left triphasic waveforms with an ABI of 1.28 and a TBI of 0.87. No evidence of arterial disease VENOUS REFLUX STUDIES; were done yesterday we do not have this report yet. 4/23; VENOUS REFLUX STUDIES did not show any significant reflux right lower extremity. No evidence of a DVT He did have significant reflux in the common . femoral vein on the left but nothing else was listed as significant. He did not have a DVT. We have been using Iodoflex under compression his wounds are making progress. 4/29; improvements in the wound surface continue. We have been using Iodoflex. He has a 20% out-of-pocket co-pay for Apligraf which is  unfortunate. We changed him to Sorbact today 5/6; started on Sorbact last week. Better looking wound surface but not much change in dimensions. 01/24/20-Patient is back at 3 weeks, wound surfaces are about the same but very minimal slough on the right leg wounds, however there is blistering on both legs adjacent to the wounds, patient denies any other symptoms or new symptoms. His studies have been reviewed and do not indicate any significant arterial or venous disease. But he is attending once in 3 weeks with home health changing in between 6/3; patient has 4 wounds 2 on each side of his lateral lower legs. These wounds are somewhat improved. He apparently arrived in clinic last week with a large blister on the right lateral lower leg that had denuded into the wound. They changed to silver alginate. Still under compression. He has straight Medicare and unfortunately has an unlimited co-pay for advanced treatment options. 6/10; his original 4 wounds are all better especially on the left lateral lower leg.  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 6/24; blister on the right lateral lower leg has healed. There is no additional wounds. The traumatic left second toe is also closed. The 4 that he has are all better 7/1; all of the patient's wounds are smaller except for the proximal one on the left lateral calf. We are using silver collagen 7/22; patient has home health. Relatively refractory wounds on the lateral part of both mid calfs. Each of them 2 wounds. The areas on the left are doing much better in fact the distal 1 looks like it is on his way to closure. The  right required debridement with adherent fibrinous debris under illumination. We have been using silver collagen. Previous arterial studies were within normal limits he also had venous reflux studies 04/03/2020 upon evaluation today patient appears to be doing excellent with regard to his lower extremity ulcers. He is going to require a little bit of debridement on the wounds on the right especially in order to clear away some biofilm and slough but this is minimal and overall he seems to be doing quite well. 8/19; the patient's wounds on the left lateral lower leg just above closed. However the 2 on the lateral part of the right have a nonviable necrotic surface. This is disappointing. There is also no change in dimensions. He has had arterial studies as well as venous reflux studies 8/26; very disappointing today. Even the more superficial areas on the left are about the same as last week. Still 2 punched-out areas completely unchanged on the right. We have been using silver collagen really making no progress. 9/2; changed to Iodoflex last week better looking wound surfaces especially on the right although they are deep and punched out. The areas on the left are more superficial open 1 of these is almost fully epithelialized although it has been this way for at least 2 weeks. He has a 20% co-pay [Medicare only] unaffordable for a skin substitute. Had some thought about a snap VAC on the deep areas on the right leg we will try to put this through in the insurance Objective Constitutional Sitting or standing Blood Pressure is within target range for patient.. Pulse regular and within target range for patient.Marland Kitchen Respirations regular, non-labored and within target range.. Temperature is normal and within the target range for the patient.Marland Kitchen Appears in no distress. Vitals Time Taken: 7:35 AM, Height: 71 in, Weight: 198 lbs, BMI: 27.6, Temperature: 97.8 F, Pulse: 93 bpm, Respiratory Rate: 18 breaths/min,  Blood Pressure: 118/74 mmHg. Cardiovascular Pedal pulses are palpable bilaterally. General Notes: Wound exam; the area on the left is superficial but not much better than last week. The area on the right more punched out but I did a debridement last week probably accounting for some of this. Tissue looks better with the Iodoflex. There is no evidence of surrounding infection. Edema control is excellent Integumentary (Hair, Skin) Wound #1 status is Open. Original cause of wound was Blister. The wound is located on the Left,Lateral Lower Leg. The wound measures 0.8cm length x 0.5cm width x 0.2cm depth; 0.314cm^2 area and 0.063cm^3 volume. There is  Fat Layer (Subcutaneous Tissue) exposed. There is no tunneling or undermining noted. There is a small amount of serosanguineous drainage noted. The wound margin is flat and intact. There is large (67-100%) red granulation within the wound bed. There is no necrotic tissue within the wound bed. Wound #3 status is Open. Original cause of wound was Blister. The wound is located on the Right,Proximal,Lateral Lower Leg. The wound measures 1.8cm length x 1.7cm width x 0.9cm depth; 2.403cm^2 area and 2.163cm^3 volume. There is Fat Layer (Subcutaneous Tissue) exposed. There is no tunneling or undermining noted. There is a medium amount of serosanguineous drainage noted. The wound margin is well defined and not attached to the wound base. There is large (67-100%) red, pink granulation within the wound bed. There is a small (1-33%) amount of necrotic tissue within the wound bed including Adherent Slough. Wound #4 status is Open. Original cause of wound was Blister. The wound is located on the Right,Distal,Lateral Lower Leg. The wound measures 2.1cm length x 2cm width x 1.1cm depth; 3.299cm^2 area and 3.629cm^3 volume. There is Fat Layer (Subcutaneous Tissue) exposed. There is no tunneling or undermining noted. There is a medium amount of serosanguineous drainage  noted. The wound margin is well defined and not attached to the wound base. There is large (67- 100%) red, pink granulation within the wound bed. There is a small (1-33%) amount of necrotic tissue within the wound bed including Adherent Slough. Wound #6 status is Open. Original cause of wound was Gradually Appeared. The wound is located on the Left,Proximal,Lateral Lower Leg. The wound measures 1.7cm length x 1.2cm width x 0.3cm depth; 1.602cm^2 area and 0.481cm^3 volume. There is Fat Layer (Subcutaneous Tissue) exposed. There is no tunneling or undermining noted. There is a small amount of serosanguineous drainage noted. The wound margin is well defined and not attached to the wound base. There is large (67-100%) red granulation within the wound bed. There is no necrotic tissue within the wound bed. 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 There was a Four Layer Compression Therapy Procedure by Zenaida Deed, RN. Post procedure Diagnosis Wound #1: Same as Pre-Procedure Wound #3 Pre-procedure diagnosis of Wound #3 is a Venous Leg Ulcer located on the Right,Proximal,Lateral Lower Leg . There was a Four Layer Compression Therapy Procedure by Zenaida Deed, RN. Post procedure Diagnosis Wound #3: Same as Pre-Procedure Plan Follow-up Appointments: Return Appointment in 1 week. Dressing Change Frequency: Other: - all wounds - change twice a week by home health Skin Barriers/Peri-Wound Care: Moisturizing lotion Wound Cleansing: May shower with  protection. - use cast protectors on the days dressings are not changed. May shower and wash wound with soap and water. - with dressing changes only. Primary Wound Dressing: Wound #1 Left,Lateral Lower Leg: Iodoflex Wound #3 Right,Proximal,Lateral Lower Leg: Iodoflex Wound #4 Right,Distal,Lateral Lower Leg: Iodoflex Wound #6 Left,Proximal,Lateral Lower Leg: Iodoflex Secondary Dressing: Dry Gauze - all wounds ABD pad - all wounds Other: - pad left medial first met head for protection. Edema Control: 4 layer compression - Bilateral Avoid standing for long periods of time Elevate legs to the level of the heart  or above for 30 minutes daily and/or when sitting, a frequency of: - throughout the day. Home Health: [place] [facility] for wound care. - [facility] #1 continue with Iodoflex under compression 2. Put snap VAC through his insurance. Attention to co-pay which in itself may be unaffordable 3. Surface of the wound looks better with the Iodoflex. Debrided last week, not necessary this would Electronic Signature(s) Signed: 05/01/2020 5:13:24 PM By: Linton Ham MD Entered By: Linton Ham on 05/01/2020 08:24:31 -------------------------------------------------------------------------------- SuperBill Details Patient Name: Date of Service: [place], [place] 05/01/2020 Medical Record Number: [id number] Patient Account Number: [id number] Date of Birth/Sex: Treating RN: 15-Jan-1941 (79 y.o. [name] Primary Care Provider: [name]., [name] Other Clinician: Referring Provider: Treating Provider/Extender: [name], [name] in Treatment: 29 Diagnosis Coding ICD-10 Codes Code Description [phone] Chronic venous hypertension (idiopathic) with inflammation of bilateral lower extremity I89.0 Lymphedema, not elsewhere classified L97.822 Non-pressure chronic ulcer of other part of left lower leg with fat layer  exposed L97.812 Non-pressure chronic ulcer of other part of right lower leg with fat layer exposed S90.812D Abrasion, left foot, subsequent encounter Facility Procedures The patient participates with Medicare or their insurance follows the Medicare Facility Guidelines: CPT4 Description Modifier Quantity Code [id number] BILATERAL: Application of multi-layer venous compression system; leg (below knee), including ankle and 1 foot. Physician Procedures : CPT4 Code Description Modifier [id number] - WC PHYS LEVEL 3 - EST PT ICD-10 Diagnosis Description L97.822 Non-pressure chronic ulcer of other part of left lower leg with fat layer exposed L97.812 Non-pressure chronic ulcer of other part of right  lower leg with fat layer exposed I87.323 Chronic venous hypertension (idiopathic) with inflammation of bilateral lower extremity Quantity: 1 Electronic Signature(s) Signed: 05/01/2020 5:13:24 PM By: Linton Ham MD Entered By: Linton Ham on 05/01/2020 08:24:53

## 2020-05-07 NOTE — Progress Notes (Signed)
[name], [place] ([id number]) Visit Report for 05/01/2020 Arrival Information Details Patient Name: Date of Service: Troy [place], [place] 05/01/2020 8:00 [place] Number: [id number] Patient Account Number: [id number] Date of Birth/Sex: Treating RN: September 02, 1940 (79 y.o. [name] Primary Care Dolorez Jeffrey: [name]., [name] Other Clinician: Referring Amana Bouska: Treating Nolie Bignell/Extender: [name], [name] in Treatment: 29 Visit Information History Since Last Visit Added or deleted any medications: No Patient Arrived: Cane Any new allergies or adverse reactions: No Arrival Time: 07:40 Had a fall or experienced change in No Accompanied By: self activities of daily living that may affect Transfer Assistance: None risk of falls: Patient Identification Verified: Yes Signs or symptoms of abuse/neglect since last visito No Secondary Verification Process Completed: Yes Hospitalized since last visit: No Patient Requires Transmission-Based Precautions: No Implantable device outside of the clinic excluding No Patient Has Alerts: Yes cellular tissue based products placed in the center Patient Alerts: Patient on Blood Thinner since last visit: Has Dressing in Place as Prescribed: Yes Has Compression in Place as Prescribed: Yes Pain Present Now: No Electronic Signature(s) Signed: 05/01/2020 4:56:16 PM By: Kela Millin Entered By: Kela Millin on 05/01/2020 07:42:50 -------------------------------------------------------------------------------- Compression Therapy Details Patient Name: Date of Service: [name] 05/01/2020 8:00 [place] Record Number: [id number] Patient Account Number: [id number] Date of Birth/Sex: Treating RN: February 10, 1941 (79 y.o. [name] Primary Care Misbah Hornaday: [name]., [name] Other Clinician: Referring Caton Popowski: Treating Issis Lindseth/Extender: [name], [name] in  Treatment: 29 Compression Therapy Performed for Wound Assessment: Wound #1 Left,Lateral Lower Leg Performed By: Clinician Baruch Gouty, RN Compression Type: Four Layer Post Procedure Diagnosis Same as Pre-procedure Electronic Signature(s) Signed: 05/02/2020 2:45:56 PM By: Baruch Gouty RN, BSN Entered By: Baruch Gouty on 05/01/2020 08:13:59 -------------------------------------------------------------------------------- Compression Therapy Details Patient Name: Date of Service: Troy [place] 05/01/2020 8:00 [place] Record Number: [id number] Patient Account Number: [id number] Date of Birth/Sex: Treating RN: 1941-02-26 (79 y.o. [name] Primary Care Jailyne Chieffo: [name]., [name] Other Clinician: Referring Tyleigh Mahn: Treating Alvey Brockel/Extender: [name], [name] in Treatment: 29 Compression Therapy Performed for Wound Assessment: Wound #3 Right,Proximal,Lateral Lower Leg Performed By: Clinician Baruch Gouty, RN Compression Type: Four Layer Post Procedure Diagnosis Same as Pre-procedure Electronic Signature(s) Signed: 05/02/2020 2:45:56 PM By: Baruch Gouty RN, BSN Entered By: Baruch Gouty on 05/01/2020 08:13:59 -------------------------------------------------------------------------------- Encounter Discharge Information Details Patient Name: Date of Service: Troy [place], [place] 05/01/2020 8:00 [place] Number: [id number] Patient Account Number: [id number] Date of Birth/Sex: Treating RN: 05/08/1941 (78 y.o. [name]) [name] Primary Care Lucrecia Mcphearson: [name]., [name] Other Clinician: Referring Gerilyn Stargell: Treating Natoshia Souter/Extender: [name], [name] in Treatment: 29 Encounter Discharge Information Items Discharge Condition: Stable Ambulatory Status: Cane Discharge Destination: Home Transportation: Private Auto Accompanied By: self Schedule Follow-up Appointment: Yes Clinical  Summary of Care: Patient Declined Electronic Signature(s) Signed: 05/07/2020 5:14:09 PM By: [name] RN Entered By: [name] on 05/01/2020 08:44:27 -------------------------------------------------------------------------------- Lower Extremity Assessment Details Patient Name: Date of Service: [name], [name] 05/01/2020 8:00 [place] Number: [id number] Patient Account Number: [id number] Date of Birth/Sex: Treating RN: [birth year] (79 y.o. [name] Primary Care Shakiyla Kook: [name]., [name] Other Clinician: Referring Rebekka Lobello: Treating Dois Juarbe/Extender: [name], [name] in Treatment:  29 Edema Assessment Assessed: [Left: No] [Right: No] Edema: [Left: Yes] [Right: Yes] Calf Left: Right: Point of Measurement: 43 cm From Medial Instep 33 cm 33 cm Ankle Left: Right: Point of Measurement: 14 cm From Medial Instep 22 cm 23 cm Vascular Assessment Pulses: Dorsalis Pedis Palpable: [Left:No] [Right:No] Electronic Signature(s) Signed: 05/01/2020 4:56:16 PM By: Kela Millin Entered By: Kela Millin on 05/01/2020 07:49:53 -------------------------------------------------------------------------------- Multi Wound Chart Details Patient Name: Date of Service: [name], [name] 05/01/2020 8:00 [place] Number: [id number] Patient Account Number: [id number] Date of Birth/Sex: Treating RN: [birth year] (79 y.o. [name] Jodeen Mclin: [name]., [name] Other Clinician: Referring Genae Strine: Treating Bekki Tavenner/Extender: [name], [name] in Treatment: 29 Vital Signs Height(in): 71 Pulse(bpm): 47 Weight(lbs): 198 Blood Pressure(mmHg): 118/74 Body Mass Index(BMI): 28 Temperature(F): 97.8 Respiratory Rate(breaths/min): 18 Photos: [1:No Photos Left, Lateral Lower Leg] [3:No Photos Right, Proximal, Lateral Lower Leg] [4:No Photos Right, Distal, Lateral Lower Leg] Wound  Location: [1:Blister] [3:Blister] [4:Blister] Wounding Event: [1:Venous Leg Ulcer] [3:Venous Leg Ulcer] [4:Venous Leg Ulcer] Primary Etiology: [1:Anemia, Congestive Heart Failure,] [3:Anemia, Congestive Heart Failure,] [4:Anemia, Congestive Heart Failure,] Comorbid History: [1:Hypertension, Peripheral Venous Disease, End Stage Renal Disease Disease, End Stage Renal Disease Disease, 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] [3:Hypertension, Peripheral Venous 09/11/2019] [4:Hypertension, Peripheral Venous 09/11/2019] Date Acquired: [1:29] [3:29] [4:29] Weeks of Treatment: [1:Open] [3:Open] [4:Open] Wound Status: [1:Yes] [3:No] [4:No] Clustered Wound: [1:1] [3:N/A] [4:N/A] Clustered Quantity: [1:0.8x0.5x0.2] [3:1.8x1.7x0.9] [4:2.1x2x1.1] Measurements L x W x D (cm) [1:0.314] [3:2.403] [4:3.299] A (cm) : rea [1:0.063] [3:2.163] [4:3.629] Volume (cm) : [1:99.50%] [3:51.40%] [4:64.50%] % Reduction in Area: [1:98.90%] [3:-337.00%] [4:-290.20%] % Reduction in Volume: [1:Full Thickness Without Exposed] [3:Full Thickness Without Exposed] [4:Full Thickness Without Exposed] Classification: [1:Support Structures Small] [3:Support Structures Medium] [4:Support Structures Medium] Exudate Amount: [1:Serosanguineous] [3:Serosanguineous] [4:Serosanguineous] Exudate Type: [1:red, brown] [3:red, brown] [4:red, brown] Exudate Color: [1:Flat and Intact] [3:Well defined, not attached] [4:Well defined, not attached] Wound Margin: [1:Large (67-100%)] [3:Large (67-100%)] [4:Large (67-100%)] Granulation Amount: [1:Red] [3:Red, Pink] [4:Red, Pink] Granulation Quality: [1:None Present (0%)] [3:Small (1-33%)] [4:Small (1-33%)] Necrotic Amount: [1:Fat Layer (Subcutaneous Tissue): Yes Fat Layer (Subcutaneous Tissue): Yes Fat Layer (Subcutaneous Tissue): Yes] Exposed Structures: [1:Fascia: No Tendon: No Muscle: No Joint: No Bone: No Medium (34-66%)] [3:Fascia: No Tendon: No Muscle: No Joint: No Bone: No Small (1-33%)] [4:Fascia:  No Tendon: No Muscle: No Joint: No Bone: No Small (1-33%)] Epithelialization: [1:Compression Therapy] [3:Compression Therapy] [4:N/A] Wound Number: 6 N/A N/A Photos: No Photos N/A N/A Left, Proximal, Lateral Lower Leg N/A N/A Wound Location: Gradually Appeared N/A N/A Wounding Event: Venous Leg Ulcer N/A N/A Primary Etiology: Anemia, Congestive Heart Failure, N/A N/A Comorbid History: Hypertension, Peripheral Venous Disease, End Stage Renal Disease 12/13/2019 N/A N/A Date Acquired: 20 N/A N/A Weeks of Treatment: Open N/A N/A Wound Status: No N/A N/A Clustered Wound: N/A N/A N/A Clustered Quantity: 1.7x1.2x0.3 N/A N/A Measurements L x W x D (cm) 1.602 N/A N/A A (cm) : rea 0.481 N/A N/A Volume (cm) : 52.00% N/A N/A % Reduction in Area: 51.90% N/A N/A % Reduction in Volume: Full Thickness Without Exposed N/A N/A Classification: Support Structures Small N/A N/A Exudate Amount: Serosanguineous N/A N/A Exudate Type: red, brown N/A N/A Exudate Color: Well defined, not attached N/A N/A Wound Margin: Large (67-100%) N/A N/A Granulation Amount: Red N/A N/A Granulation Quality: None Present (0%) N/A N/A Necrotic Amount: Fat Layer (Subcutaneous Tissue): Yes N/A N/A Exposed Structures: Fascia: No Tendon:  No Muscle: No Joint: No Bone: No Small (1-33%) N/A N/A Epithelialization: N/A N/A N/A Procedures Performed: Treatment Notes Electronic Signature(s) Signed: 05/01/2020 5:08:15 PM By: Levan Hurst RN, BSN Signed: 05/01/2020 5:13:24 PM By: Linton Ham MD Entered By: Linton Ham on 05/01/2020 08:21:53 -------------------------------------------------------------------------------- Multi-Disciplinary Care Plan Details Patient Name: Date of Service: Troy [place], [place] 05/01/2020 8:00 [place] Number: [id number] Patient Account Number: [id number] Date of Birth/Sex: Treating RN: May 02, 1941 (79 y.o. [name] Primary Care Berlynn Warsame: [name]., [name] Other Clinician: Referring Medardo Hassing: Treating Odaliz Mcqueary/Extender: [name], [name] in Treatment: 29 Active Inactive Venous Leg Ulcer Nursing Diagnoses: Knowledge deficit related to disease process and management Potential for venous Insuffiency (use before diagnosis confirmed) Goals: Patient will maintain optimal edema control Date Initiated: 05/01/2020 [place] Date: 05/29/2020 Goal Status: Active Interventions: Assess peripheral edema status every visit. Compression as ordered Provide education on venous insufficiency Notes: Wound/Skin Impairment Nursing Diagnoses: Knowledge deficit related to ulceration/compromised skin integrity Goals: Patient/caregiver will verbalize understanding of skin care regimen Date Initiated: 10/11/2019 Target Resolution Date: 05/29/2020 Goal Status: Active Interventions: Assess patient/caregiver ability to obtain necessary supplies Assess patient/caregiver ability to perform ulcer/skin care regimen upon admission and as needed Assess ulceration(s) every visit Provide education on ulcer and skin care Treatment Activities: Skin care regimen initiated : 10/11/2019 Topical wound management initiated : 10/11/2019 Notes: Electronic Signature(s) Signed: 05/02/2020 2:45:56 PM By: Baruch Gouty RN, BSN Entered By: Baruch Gouty on 05/01/2020 08:12:39 -------------------------------------------------------------------------------- Pain Assessment Details Patient Name: Date of Service: Troy [place], [place] 05/01/2020 8:00 [place] Number: [id number] Patient Account Number: [id number] Date of Birth/Sex: Treating RN: Jun 06, 1941 (79 y.o. [name] Primary Care Breslyn Abdo: [name]., [name] Other Clinician: Referring Anapaula Severt: Treating Abbeygail Igoe/Extender: [name], [name] in Treatment: 29 Active Problems Location of Pain Severity and Description of  Pain Patient Has Paino No Site Locations Pain Management and Medication Current Pain Management: Electronic Signature(s) Signed: 05/01/2020 4:56:16 PM By: Kela Millin Entered By: Kela Millin on 05/01/2020 07:49:49 -------------------------------------------------------------------------------- Patient/Caregiver Education Details Patient Name: Date of Service: Troy [place], [place] 9/2/2021andnbsp8:00 [place] Number: [id number] Patient Account Number: [id number] Date of Birth/Gender: Treating RN: [birth year] (79 y.o. [name] Primary Care Physician: [name]., [name] Other Clinician: Referring Physician: Treating Physician/Extender: [name], [name] in Treatment: 29 Education Assessment Education Provided To: Patient Education Topics Provided Venous: Methods: Explain/Verbal Responses: Reinforcements needed, State content correctly Wound/Skin Impairment: Methods: Explain/Verbal Responses: Reinforcements needed, State content correctly Electronic Signature(s) Signed: 05/02/2020 2:45:56 PM By: Baruch Gouty RN, BSN Entered By: Baruch Gouty on 05/01/2020 08:13:23 -------------------------------------------------------------------------------- Wound Assessment Details Patient Name: Date of Service: Troy [place] 05/01/2020 8:00 [place] Number: [id number] Patient Account Number: [id number] Date of Birth/Sex: Treating RN: [birth year] (79 y.o. [name] Primary Care Artavious Trebilcock: [name]., [name] Other Clinician: Referring Mikaiya Tramble: Treating Madesyn Ast/Extender: [name], [name] in Treatment: 29 Wound Status Wound Number: 1 Primary Venous Leg Ulcer Etiology: Wound Location: Left, Lateral Lower Leg Wound Open Wounding Event: Blister Status: Date Acquired: 09/11/2019 Comorbid Anemia, Congestive Heart Failure, Hypertension, Peripheral Weeks Of Treatment:  29 History: Venous Disease, End Stage Renal Disease Clustered Wound: Yes Photos Photo Uploaded By: Mikeal Hawthorne on 05/07/2020 12:07:12 Wound Measurements Length: (cm) 0.8 Width: (cm) 0.5 Depth: (  cm) 0.2 Clustered Quantity: 1 Area: (cm) 0. Volume: (cm) 0. % Reduction in Area: 99.5% % Reduction in Volume: 98.9% Epithelialization: Medium (34-66%) Tunneling: No 314 Undermining: No 063 Wound Description Classification: Full Thickness Without Exposed Support Str Wound Margin: Flat and Intact Exudate Amount: Small Exudate Type: Serosanguineous Exudate Color: red, brown uctures Foul Odor After Cleansing: No Slough/Fibrino No Wound Bed Granulation Amount: Large (67-100%) Exposed Structure Granulation Quality: Red Fascia Exposed: No Necrotic Amount: None Present (0%) Fat Layer (Subcutaneous Tissue) Exposed: Yes Tendon Exposed: No Muscle Exposed: No Joint Exposed: No Bone Exposed: No Treatment Notes Wound #1 (Left, Lateral Lower Leg) 1. Cleanse With Wound Cleanser Soap and water 3. Primary Dressing Applied Iodoflex 4. Secondary Dressing Dry Gauze 6. Support Layer Applied 4 layer compression wrap Notes netting Electronic Signature(s) Signed: 05/01/2020 4:56:16 PM By: Kela Millin Entered By: Kela Millin on 05/01/2020 07:53:31 -------------------------------------------------------------------------------- Wound Assessment Details Patient Name: Date of Service: [name], [name] 05/01/2020 8:00 [place] Record Number: [id number] Patient Account Number: [id number] Date of Birth/Sex: Treating RN: [birth year] (79 y.o. [name] Primary Care Jeanette Moffatt: [name]., [name] Other Clinician: Referring Matti Minney: Treating Zarinah Oviatt/Extender: [name], [name] in Treatment: 29 Wound Status Wound Number: 3 Primary Venous Leg Ulcer Etiology: Wound Location: Right, Proximal, Lateral Lower Leg Wound Open Wounding Event:  Blister Status: Date Acquired: 09/11/2019 Comorbid Anemia, Congestive Heart Failure, Hypertension, Peripheral Weeks Of Treatment: 29 History: Venous Disease, End Stage Renal Disease Clustered Wound: No Photos Photo Uploaded By: Mikeal Hawthorne on 05/07/2020 12:06:44 Wound Measurements Length: (cm) 1.8 Width: (cm) 1.7 Depth: (cm) 0.9 Area: (cm) 2.403 Volume: (cm) 2.163 % Reduction in Area: 51.4% % Reduction in Volume: -337% Epithelialization: Small (1-33%) Tunneling: No Undermining: No Wound Description Classification: Full Thickness Without Exposed Support Structures Wound Margin: Well defined, not attached Exudate Amount: Medium Exudate Type: Serosanguineous Exudate Color: red, brown Foul Odor After Cleansing: No Slough/Fibrino Yes Wound Bed Granulation Amount: Large (67-100%) Exposed Structure Granulation Quality: Red, Pink Fascia Exposed: No Necrotic Amount: Small (1-33%) Fat Layer (Subcutaneous Tissue) Exposed: Yes Necrotic Quality: Adherent Slough Tendon Exposed: No Muscle Exposed: No Joint Exposed: No Bone Exposed: No Treatment Notes Wound #3 (Right, Proximal, Lateral Lower Leg) 1. Cleanse With Wound Cleanser Soap and water 3. Primary Dressing Applied Iodoflex 4. Secondary Dressing Dry Gauze 6. Support Layer Applied 4 layer compression wrap Notes netting Electronic Signature(s) Signed: 05/01/2020 4:56:16 PM By: Kela Millin Entered By: Kela Millin on 05/01/2020 07:54:45 -------------------------------------------------------------------------------- Wound Assessment Details Patient Name: Date of Service: [name] 05/01/2020 8:00 [place] Record Number: [id number] Patient Account Number: [id number] Date of Birth/Sex: Treating RN: 29-Aug-1941 (79 y.o. [name] Primary Care Garret Teale: [name]., [name] Other Clinician: Referring Lasondra Hodgkins: Treating Arvie Bartholomew/Extender: [name], [name] in  Treatment: 29 Wound Status Wound Number: 4 Primary Venous Leg Ulcer Etiology: Wound Location: Right, Distal, Lateral Lower Leg Wound Open Wounding Event: Blister Status: Date Acquired: 09/11/2019 Comorbid Anemia, Congestive Heart Failure, Hypertension, Peripheral Weeks Of Treatment: 29 History: Venous Disease, End Stage Renal Disease Clustered Wound: No Photos Photo Uploaded By: Mikeal Hawthorne on 05/07/2020 12:06:45 Wound Measurements Length: (cm) 2.1 Width: (cm) 2 Depth: (cm) 1.1 Area: (cm) 3.299 Volume: (cm) 3.629 % Reduction in Area: 64.5% % Reduction in Volume: -290.2% Epithelialization: Small (1-33%) Tunneling: No Undermining: No Wound Description Classification: Full Thickness Without Exposed Support Structu Wound Margin: Well defined, not attached  Exudate Amount: Medium Exudate Type: Serosanguineous Exudate Color: red, brown res Foul Odor After Cleansing: No Slough/Fibrino Yes Wound Bed Granulation Amount: Large (67-100%) Exposed Structure Granulation Quality: Red, Pink Fascia Exposed: No Necrotic Amount: Small (1-33%) Fat Layer (Subcutaneous Tissue) Exposed: Yes Necrotic Quality: Adherent Slough Tendon Exposed: No Muscle Exposed: No Joint Exposed: No Bone Exposed: No Treatment Notes Wound #4 (Right, Distal, Lateral Lower Leg) 1. Cleanse With Wound Cleanser Soap and water 3. Primary Dressing Applied Iodoflex 4. Secondary Dressing Dry Gauze 6. Support Layer Applied 4 layer compression wrap Notes netting Electronic Signature(s) Signed: 05/01/2020 4:56:16 PM By: Kela Millin Entered By: Kela Millin on 05/01/2020 07:55:14 -------------------------------------------------------------------------------- Wound Assessment Details Patient Name: Date of Service: [name] 05/01/2020 8:00 [place] Record Number: [id number] Patient Account Number: [id number] Date of Birth/Sex: Treating RN: 11/03/1940 (79 y.o. [name] Primary Care Missy Baksh: [name]., [name] Other Clinician: Referring Benjamim Harnish: Treating Xylan Sheils/Extender: [name], [name] in Treatment: 29 Wound Status Wound Number: 6 Primary Venous Leg Ulcer Etiology: Wound Location: Left, Proximal, Lateral Lower Leg Wound Open Wounding Event: Gradually Appeared Status: Date Acquired: 12/13/2019 Comorbid Anemia, Congestive Heart Failure, Hypertension, Peripheral Weeks Of Treatment: 20 History: Venous Disease, End Stage Renal Disease Clustered Wound: No Photos Photo Uploaded By: Mikeal Hawthorne on 05/07/2020 12:07:13 Wound Measurements Length: (cm) 1.7 Width: (cm) 1.2 Depth: (cm) 0.3 Area: (cm) 1.602 Volume: (cm) 0.481 % Reduction in Area: 52% % Reduction in Volume: 51.9% Epithelialization: Small (1-33%) Tunneling: No Undermining: No Wound Description Classification: Full Thickness Without Exposed Support Structures Wound Margin: Well defined, not attached Exudate Amount: Small Exudate Type: Serosanguineous Exudate Color: red, brown Foul Odor After Cleansing: No Slough/Fibrino No Wound Bed Granulation Amount: Large (67-100%) Exposed Structure Granulation Quality: Red Fascia Exposed: No Necrotic Amount: None Present (0%) Fat Layer (Subcutaneous Tissue) Exposed: Yes Tendon Exposed: No Muscle Exposed: No Joint Exposed: No Bone Exposed: No Treatment Notes Wound #6 (Left, Proximal, Lateral Lower Leg) 1. Cleanse With Wound Cleanser Soap and water 3. Primary Dressing Applied Iodoflex 4. Secondary Dressing Dry Gauze 6. Support Layer Applied 4 layer compression wrap Notes netting Electronic Signature(s) Signed: 05/01/2020 4:56:16 PM By: Kela Millin Entered By: Kela Millin on 05/01/2020 07:54:05 -------------------------------------------------------------------------------- Vitals Details Patient Name: Date of Service: Troy [place], [place] 05/01/2020 8:00 [place]  Number: [id number] Patient Account Number: [id number] Date of Birth/Sex: Treating RN: [birth year] (79 y.o. [name] Primary Care Daton Szilagyi: [name]., [name] Other Clinician: Referring Ganon Demasi: Treating Orva Riles/Extender: [name], [name] in Treatment: 29 Vital Signs Time Taken: 07:35 Temperature (F): 97.8 Height (in): 71 Pulse (bpm): 93 Weight (lbs): 198 Respiratory Rate (breaths/min): 18 Body Mass Index (BMI): 27.6 Blood Pressure (mmHg): 118/74 Reference Range: 80 - 120 mg / dl Electronic Signature(s) Signed: 05/01/2020 4:56:16 PM By: Kela Millin Entered By: Kela Millin on 05/01/2020 07:49:44

## 2020-05-08 ENCOUNTER — Encounter (HOSPITAL_BASED_OUTPATIENT_CLINIC_OR_DEPARTMENT_OTHER): Payer: Medicare Other | Admitting: Internal Medicine

## 2020-05-08 DIAGNOSIS — I87333 Chronic venous hypertension (idiopathic) with ulcer and inflammation of bilateral lower extremity: Secondary | ICD-10-CM | POA: Diagnosis not present

## 2020-05-09 NOTE — Progress Notes (Signed)
CLEBERT, WENGER ([id number]) Visit Report for 05/08/2020 Debridement Details Patient Name: Date of Service: [place] 05/08/2020 8:00 [place] Record Number: [id number] Patient Account Number: [id number] Date of Birth/Sex: Treating RN: 08-06-1941 (79 y.o. [name] Provider: [name]., [name] Other Clinician: Referring Provider: Treating Provider/Extender: [name], [name] in Treatment: 30 Debridement Performed for Assessment: Wound #3 Right,Proximal,Lateral Lower Leg Performed By: Physician Ricard Dillon., MD Debridement Type: Debridement Severity of Tissue Pre Debridement: Fat layer exposed Level of Consciousness (Pre-procedure): Awake and Alert Pre-procedure Verification/Time Out Yes - 08:15 Taken: Start Time: 08:16 Pain Control: Lidocaine 4% T opical Solution T Area Debrided (L x W): otal 2 (cm) x 2 (cm) = 4 (cm) Tissue and other material debrided: Viable, Non-Viable, Slough, Subcutaneous, Skin: Dermis , Fibrin/Exudate, Slough Level: Skin/Subcutaneous Tissue Debridement Description: Excisional Instrument: Curette Bleeding: Moderate Hemostasis Achieved: Pressure End Time: 08:20 Procedural Pain: 0 Post Procedural Pain: 2 Response to Treatment: Procedure was tolerated well Level of Consciousness (Post- Awake and Alert procedure): Post Debridement Measurements of Total Wound Length: (cm) 2 Width: (cm) 1.8 Depth: (cm) 0.5 Volume: (cm) 1.414 Character of Wound/Ulcer Post Debridement: Improved Severity of Tissue Post Debridement: Fat layer exposed Post Procedure Diagnosis Same as Pre-procedure Electronic Signature(s) Signed: 05/08/2020 5:40:39 PM By: Deon Pilling Signed: 05/09/2020 1:37:47 PM By: Linton Ham MD Entered By: Linton Ham on 05/08/2020 08:45:12 -------------------------------------------------------------------------------- Debridement Details Patient Name: Date of Service: [place], [place] 05/08/2020 8:00 A M Medical Record Number: [id number] Patient Account Number: [id number] Date of Birth/Sex: Treating RN: 12-Feb-1941 (79 y.o. [name] Provider: [name]., [name] Other Clinician: Referring Provider: Treating Provider/Extender: [name], [name] in Treatment: 30 Debridement Performed for Assessment: Wound #4 Right,Distal,Lateral Lower Leg Performed By: Physician Ricard Dillon., MD Debridement Type: Debridement Severity of Tissue Pre Debridement: Fat layer exposed Level of Consciousness (Pre-procedure): Awake and Alert Pre-procedure Verification/Time Out Yes - 08:15 Taken: Start Time: 08:16 Pain Control: Lidocaine 4% T opical Solution T Area Debrided (L x W): otal 2.7 (cm) x 1.7 (cm) = 4.59 (cm) Tissue and other material debrided: Viable, Non-Viable, Slough, Subcutaneous, Skin: Dermis , Fibrin/Exudate, Slough Level: Skin/Subcutaneous Tissue Debridement Description: Excisional Instrument: Curette Specimen: Tissue Culture Number of Specimens T aken: 1 Bleeding: Moderate Hemostasis Achieved: Pressure End Time: 08:20 Procedural Pain: 0 Post Procedural Pain: 2 Response to Treatment: Procedure was tolerated well Level of Consciousness (Post- Awake and Alert procedure): Post Debridement Measurements of Total Wound Length: (cm) 2.7 Width: (cm) 1.7 Depth: (cm) 0.8 Volume: (cm) 2.884 Character of Wound/Ulcer Post Debridement: Improved Severity of Tissue Post Debridement: Fat layer exposed Post Procedure Diagnosis Same as Pre-procedure Electronic Signature(s) Signed: 05/08/2020 5:40:39 PM By: Deon Pilling Signed: 05/09/2020 1:37:47 PM By: Linton Ham MD Entered By: Linton Ham on 05/08/2020 08:45:26 -------------------------------------------------------------------------------- HPI Details Patient Name: Date of Service: [place] 05/08/2020 8:00 A M Medical Record Number:  [id number] Patient Account Number: [id number] Date of Birth/Sex: Treating RN: [birth year] (79 y.o. [name], [name] Primary Care Provider: [name]., [name] Other Clinician: Referring Provider: Treating Provider/Extender: [name], [name] in Treatment: 64 History of Present Illness HPI Description: ADMISSION 10/11/2019 This is a 79 year old man with a history of a severe cardiomyopathy with an ejection fraction of about 20%, chronic renal failure stage III. He is listed as a  type II diabetic in epic although the patient denies this. He also has a history of PVD. He states for the last month he has had wounds on his bilateral lower extremities that started off as blisters which denuded. He has areas on the left lateral calf and 2 on the right lateral. He has an area on the left first met head which he did not know was there he we identified this on intake. He has been using Silvadene cream provided by his primary care physician but he is complaining that this burns. 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 He was acutely ill at that time complicating GI bleed. ABIs in our clinic were 1.16 on the right and 1.13 on the left 2/25; the patient comes in with his areas on the left lateral and right lateral calf. There is also an area over the left first MTP bunion deformity. We have been using Sorbact. His edema control is fairly good 3/4; left lateral and right lateral calf. Most of his wounds are in the same position tightly adherent nonviable debris. On the right we debrided the superior wound on the left both wounds.  The area on his bunion over the left first MTP medially is I think just about closed. We have been using Sorbact without a lot of success changed to Iodoflex under compression 3/11; left lateral and right lateral calf perhaps minor improvement in the surface condition. We have been using Iodoflex. The area over the bunion of the left first MTP has closed over 3/18; left lateral and right lateral calf not much improvement. We have been using Iodoflex. Aggressive debridement last week. 3/25; left lateral and right lateral calf. We have been using Iodoflex. There is some improvement in the superior area on the right and 2 on the lateral left although there is still a lot of debris on the surface. The inferior area on the right is still a completely nonviable surface. 4/8; left lateral and right lateral calfs. Essentially mirror-image looking wounds 2 wounds on each side in close juxtaposition we have been using Iodoflex with some improvement in the very adherent fibrinous debris but not a lot. The patient has arterial studies next Wednesday morning and venous studies next Thursday morning. I have been avoiding any further aggressive debridement until we see the arterial study results. His ABIs were fairly good in this clinic and is dorsalis pedis pulses are palpable but the wound beds are pale. 4/15; left lateral and right lateral calfs. Essentially mirror-image wounds with 2 wounds on each side in close juxtaposition but with rims of separating normal tissue. We have been using Iodoflex. The base of the wounds has been cleaning up quite nicely ARTERIAL STUDIES were done showing the patient had an ABI on the right of 1.27 with triphasic waveforms and a TBI of 0.90 on the left triphasic waveforms with an ABI of 1.28 and a TBI of 0.87. No evidence of arterial disease VENOUS REFLUX STUDIES; were done yesterday we do not have this report yet. 4/23; VENOUS REFLUX STUDIES did not show any significant reflux  right lower extremity. No evidence of a DVT He did have significant reflux in the  common . femoral vein on the left but nothing else was listed as significant. He did not have a DVT. We have been using Iodoflex under compression his wounds are making progress. 4/29; improvements in the wound surface continue. We have been using Iodoflex. He has a 20% out-of-pocket co-pay for Apligraf which is unfortunate. We changed him to Sorbact today 5/6; started on Sorbact last week. Better looking wound surface but not much change in dimensions. 01/24/20-Patient is back at 3 weeks, wound surfaces are about the same but very minimal slough on the right leg wounds, however there is blistering on both legs adjacent to the wounds, patient denies any other symptoms or new symptoms. His studies have been reviewed and do not indicate any significant arterial or venous disease. But he is attending once in 3 weeks with home health changing in between 6/3; patient has 4 wounds 2 on each side of his lateral lower legs. These wounds are somewhat improved. He apparently arrived in clinic last week with a large blister on the right lateral lower leg that had denuded into the wound. They changed to silver alginate. Still under compression. He has straight Medicare and unfortunately has an unlimited co-pay for advanced treatment options. 6/10; his original 4 wounds are all better especially on the left lateral lower leg. Areas on the right medial have a better looking surface were using silver collagen The blister on the right lateral lower leg from last week has an open area however this looks fairly healthy were using silver alginate on this He comes in today having "stubbed" his left second toe he has an abrasion at the base of the nailbed I think he probably caught the nail which is mycotic. 6/24; blister on the right lateral lower leg has healed. There is no additional wounds. The traumatic left second toe is also closed.  The 4 that he has are all better 7/1; all of the patient's wounds are smaller except for the proximal one on the left lateral calf. We are using silver collagen 7/22; patient has home health. Relatively refractory wounds on the lateral part of both mid calfs. Each of them 2 wounds. The areas on the left are doing much better in fact the distal 1 looks like it is on his way to closure. The right required debridement with adherent fibrinous debris under illumination. We have been using silver collagen. Previous arterial studies were within normal limits he also had venous reflux studies 04/03/2020 upon evaluation today patient appears to be doing excellent with regard to his lower extremity ulcers. He is going to require a little bit of debridement on the wounds on the right especially in order to clear away some biofilm and slough but this is minimal and overall he seems to be doing quite well. 8/19; the patient's wounds on the left lateral lower leg just above closed. However the 2 on the lateral part of the right have a nonviable necrotic surface. This is disappointing. There is also no change in dimensions. He has had arterial studies as well as venous reflux studies 8/26; very disappointing today. Even the more superficial areas on the left are about the same as last week. Still 2 punched-out areas completely unchanged on the right. We have been using silver collagen really making no progress. 9/2; changed to Iodoflex last week better looking wound surfaces especially on the right although they are deep and punched out. The areas on the left are more superficial open 1 of these is  almost fully epithelialized although it has been this way for at least 2 weeks. He has a 20% co-pay [Medicare only] unaffordable for a skin substitute. Had some thought about a snap VAC on the deep areas on the right leg we will try to put this through in the insurance 9/9; punched-out wounds on the bilateral lateral lower  extremities. We have been operating as if the these were chronic venous wounds with secondary lymphedema. The areas on the left have been doing well in fact one of them is closed over. We have had no improvement in the areas on the right we have been using Iodoflex to help with surface debridement. The areas on the right are deeper wider. I do not see any evidence of infection. Home health is not been putting the wraps on high enough he has localized lymphedema right above the wounds. He has had arterial and venous studies already Electronic Signature(s) Signed: 05/09/2020 1:37:47 PM By: Linton Ham MD Entered By: Linton Ham on 05/08/2020 08:47:07 -------------------------------------------------------------------------------- Physical Exam Details Patient Name: Date of Service: [name] 05/08/2020 8:00 [place] Number: [id number] Patient Account Number: [id number] Date of Birth/Sex: Treating RN: July 14, 1941 (79 y.o. [name], [name] Primary Care Provider: [name]., [name] Other Clinician: Referring Provider: Treating Provider/Extender: [name], [name] in Treatment: 30 Constitutional Sitting or standing Blood Pressure is within target range for patient.. Pulse regular and within target range for patient.Marland Kitchen Respirations regular, non-labored and within target range.. Temperature is normal and within the target range for the patient.Marland Kitchen Appears in no distress. Notes Wound exam; the area on the left has one of the wounds healed the other is more superficial. Debridement of the wound edges on the remaining wound with a #5 curette callus skin and subcutaneous tissue Extensive debridements of the areas on the right gritty surface material, callus around the edges. Hemostasis with direct pressure. Post debridement and area was anesthetized of the lower wound medially and a specimen was obtained for pathology Also noted that we have very poor  lymphedema control above the level of the wounds which is roughly on the mid calf Electronic Signature(s) Signed: 05/09/2020 1:37:47 PM By: Linton Ham MD Entered By: Linton Ham on 05/08/2020 08:48:18 -------------------------------------------------------------------------------- Physician Orders Details Patient Name: Date of Service: [place] 05/08/2020 8:00 [place] Number: [id number] Patient Account Number: [id number] Date of Birth/Sex: Treating RN: 26-Dec-1940 (79 y.o. [name], [name] Primary Care Provider: [name]., [name] Other Clinician: Referring Provider: Treating Provider/Extender: [name], [name] in Treatment: 46 Verbal / Phone Orders: No Diagnosis Coding ICD-10 Coding Code Description [phone] Chronic venous hypertension (idiopathic) with inflammation of bilateral lower extremity I89.0 Lymphedema, not elsewhere classified L97.822 Non-pressure chronic ulcer of other part of left lower leg with fat layer exposed L97.812 Non-pressure chronic ulcer of other part of right lower leg with fat layer exposed S90.812D Abrasion, left foot, subsequent encounter Follow-up Appointments Return Appointment in 1 week. Dressing Change Frequency Other: - all wounds - change twice a week by home health Skin Barriers/Peri-Wound Care Moisturizing lotion Wound Cleansing May shower with protection. - use cast protectors on the days dressings are not changed. May shower and wash wound with soap and water. - with dressing changes only. Primary Wound Dressing Wound #3 Right,Proximal,Lateral Lower Leg Silver Collagen - moisten with hydrogel. Wound #4 Right,Distal,Lateral Lower Leg Silver Collagen - moisten  with hydrogel. Wound #6 Left,Proximal,Lateral Lower Leg Silver Collagen - moisten with hydrogel. Secondary Dressing Dry Gauze - all wounds ABD pad - all wounds Other: - pad left medial first met head for protection. Edema  Control 4 layer compression - Bilateral - ***PLEASE ENSURE TO WRAP BOTH LEGS FROM JUST BELOW THE KNEE TO FOOT .*** Avoid standing for long periods of time Elevate legs to the level of the heart or above for 30 minutes daily and/or when sitting, a frequency of: - throughout the day. [place] skilled [facility] for wound care. - [facility] Bacteria identified in Tissue by Biopsy culture (MICRO) - Biopsy of Wound #4 Right,Distal,Lateral Lower Leg. LOINC Code: 46270-3 Convenience Name: Biopsy specimen culture Electronic Signature(s) Signed: 05/08/2020 5:40:39 PM By: Deon Pilling Signed: 05/09/2020 1:37:47 PM By: Linton Ham MD Entered By: Deon Pilling on 05/08/2020 08:29:48 -------------------------------------------------------------------------------- Problem List Details Patient Name: Date of Service: [place] 05/08/2020 8:00 [place] Number: [id number] Patient Account Number: [id number] Date of Birth/Sex: Treating RN: September 17, 1940 (79 y.o. 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/06/2020 No Yes L97.812 Non-pressure chronic ulcer of other part of right lower leg with fat layer 01/03/2020 No Yes exposed S90.812D Abrasion, left foot, subsequent encounter 02/07/2020 No Yes Inactive Problems ICD-10 Code Description Active Date Inactive Date L97.521 Non-pressure chronic ulcer of other part of left foot limited to breakdown of skin 10/11/2019  10/11/2019 Resolved Problems ICD-10 Code Description Active Date Resolved Date 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 10/11/2019 10/11/2019 Electronic Signature(s) Signed: 05/09/2020 1:37:47 PM By: Linton Ham MD Entered By: Linton Ham on 05/08/2020 08:44:43 -------------------------------------------------------------------------------- Progress Note Details Patient Name: Date of Service: [place] 05/08/2020 8:00 [place] Record Number: [id number] Patient Account Number: [id number] Date of Birth/Sex: Treating RN: 01/21/1941 (79 y.o. [name], [name] Primary Care Provider: [name]., [name] Other Clinician: Referring Provider: Treating Provider/Extender: [name], [name] in Treatment: 30 Subjective History of Present Illness (HPI) ADMISSION 10/11/2019 This is a 79 year old man with a history of a severe cardiomyopathy with an ejection fraction of about 20%, chronic renal failure stage III. He is listed as a type II diabetic in epic although the patient denies this. He also has a history of PVD. He states for the last month he has had wounds on his bilateral lower extremities that started off as blisters which denuded. He has areas on the left lateral calf and 2 on the right lateral. He has an area on the left first met head which he did not know was there he we identified this on intake. He has been using Silvadene cream provided by his primary care physician but he is complaining that this burns. 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 He was acutely  ill at that time complicating GI bleed. ABIs in our clinic were 1.16 on the right and 1.13 on the left 2/25; the patient comes in with his areas on the left lateral and right lateral calf. There is also an area over the left first MTP bunion deformity. We have been using Sorbact. His edema control is fairly good 3/4; left lateral and right lateral calf. Most of his wounds are in the same position tightly adherent nonviable debris. On the right we debrided the superior wound on the left both wounds. The area on his bunion over the left first MTP medially is I think just about closed. We have been using Sorbact without a lot of success changed to Iodoflex under compression 3/11; left lateral and right lateral calf perhaps minor improvement in the surface condition. We have been using Iodoflex. The area over the bunion of the left first MTP has closed over 3/18; left lateral and right lateral calf not much improvement. We have been using Iodoflex. Aggressive debridement last week. 3/25; left lateral and right lateral calf. We have been using Iodoflex. There is some improvement in the superior area on the right and 2 on the lateral left although there is still a lot of debris on the surface. The inferior area on the right is still a completely nonviable surface. 4/8; left lateral and right lateral calfs. Essentially mirror-image looking wounds 2 wounds on each side in close juxtaposition we have been using Iodoflex with some improvement in the very adherent fibrinous debris but not a lot. The patient has arterial studies next Wednesday morning and venous studies next Thursday morning. I have been avoiding any further aggressive debridement until we see the arterial study results. His ABIs were fairly good in this clinic and is dorsalis pedis pulses are palpable but the wound beds are pale. 4/15; left lateral and right lateral calfs. Essentially mirror-image wounds with 2 wounds on each side in close  juxtaposition but with rims of separating normal tissue. We have been using Iodoflex. The base of the wounds has been cleaning up quite nicely ARTERIAL STUDIES were done showing the patient had an ABI on the right of 1.27 with triphasic waveforms and a TBI of 0.90 on the left triphasic waveforms with an ABI of 1.28 and a TBI of 0.87. No evidence of arterial disease VENOUS REFLUX STUDIES; were done yesterday we do not have this report yet. 4/23; VENOUS REFLUX STUDIES did not show any significant reflux right lower extremity. No evidence of a DVT He did have significant reflux in the common . femoral vein on the left but nothing else was listed as significant. He did not have a DVT. We have been using Iodoflex under compression his wounds are making progress. 4/29; improvements in the wound surface continue. We have been using Iodoflex. He has a 20% out-of-pocket co-pay for Apligraf which is unfortunate. We changed him to Sorbact today 5/6; started on Sorbact last week. Better looking wound surface but not much change in dimensions. 01/24/20-Patient is back at 3 weeks, wound surfaces are about the same but very minimal slough on the right leg wounds, however there is blistering on both legs adjacent to the wounds, patient denies any other symptoms or new symptoms. His studies have been reviewed and do not indicate any significant arterial or venous disease. But he is attending once in 3 weeks with home health  changing in between 6/3; patient has 4 wounds 2 on each side of his lateral lower legs. These wounds are somewhat improved. He apparently arrived in clinic last week with a large blister on the right lateral lower leg that had denuded into the wound. They changed to silver alginate. Still under compression. He has straight Medicare and unfortunately has an unlimited co-pay for advanced treatment options. 6/10; his original 4 wounds are all better especially on the left lateral lower leg. Areas  on the right medial have a better looking surface were using silver collagen ooThe blister on the right lateral lower leg from last week has an open area however this looks fairly healthy were using silver alginate on this ooHe comes in today having "stubbed" his left second toe he has an abrasion at the base of the nailbed I think he probably caught the nail which is mycotic. 6/24; blister on the right lateral lower leg has healed. There is no additional wounds. The traumatic left second toe is also closed. The 4 that he has are all better 7/1; all of the patient's wounds are smaller except for the proximal one on the left lateral calf. We are using silver collagen 7/22; patient has home health. Relatively refractory wounds on the lateral part of both mid calfs. Each of them 2 wounds. The areas on the left are doing much better in fact the distal 1 looks like it is on his way to closure. The right required debridement with adherent fibrinous debris under illumination. We have been using silver collagen. Previous arterial studies were within normal limits he also had venous reflux studies 04/03/2020 upon evaluation today patient appears to be doing excellent with regard to his lower extremity ulcers. He is going to require a little bit of debridement on the wounds on the right especially in order to clear away some biofilm and slough but this is minimal and overall he seems to be doing quite well. 8/19; the patient's wounds on the left lateral lower leg just above closed. However the 2 on the lateral part of the right have a nonviable necrotic surface. This is disappointing. There is also no change in dimensions. He has had arterial studies as well as venous reflux studies 8/26; very disappointing today. Even the more superficial areas on the left are about the same as last week. Still 2 punched-out areas completely unchanged on the right. We have been using silver collagen really making no  progress. 9/2; changed to Iodoflex last week better looking wound surfaces especially on the right although they are deep and punched out. The areas on the left are more superficial open 1 of these is almost fully epithelialized although it has been this way for at least 2 weeks. He has a 20% co-pay [Medicare only] unaffordable for a skin substitute. Had some thought about a snap VAC on the deep areas on the right leg we will try to put this through in the insurance 9/9; punched-out wounds on the bilateral lateral lower extremities. We have been operating as if the these were chronic venous wounds with secondary lymphedema. The areas on the left have been doing well in fact one of them is closed over. We have had no improvement in the areas on the right we have been using Iodoflex to help with surface debridement. The areas on the right are deeper wider. I do not see any evidence of infection. Home health is not been putting the wraps on high enough he has  localized lymphedema right above the wounds. He has had arterial and venous studies already Objective Constitutional Sitting or standing Blood Pressure is within target range for patient.. Pulse regular and within target range for patient.Marland Kitchen Respirations regular, non-labored and within target range.. Temperature is normal and within the target range for the patient.Marland Kitchen Appears in no distress. Vitals Time Taken: 7:50 AM, Height: 71 in, Weight: 198 lbs, BMI: 27.6, Temperature: 98 F, Pulse: 92 bpm, Respiratory Rate: 18 breaths/min, Blood Pressure: 120/80 mmHg. General Notes: Wound exam; the area on the left has one of the wounds healed the other is more superficial. Debridement of the wound edges on the remaining wound with a #5 curette callus skin and subcutaneous tissue ooExtensive debridements of the areas on the right gritty surface material, callus around the edges. Hemostasis with direct pressure. Post debridement and area was anesthetized of  the lower wound medially and a specimen was obtained for pathology ooAlso noted that we have very poor lymphedema control above the level of the wounds which is roughly on the mid calf Integumentary (Hair, Skin) Wound #1 status is Open. Original cause of wound was Blister. The wound is located on the Left,Lateral Lower Leg. The wound measures 0cm length x 0cm width x 0cm depth; 0cm^2 area and 0cm^3 volume. There is no tunneling or undermining noted. There is a none present amount of drainage noted. The wound margin is flat and intact. There is no granulation within the wound bed. There is no necrotic tissue within the wound bed. Wound #3 status is Open. Original cause of wound was Blister. The wound is located on the Right,Proximal,Lateral Lower Leg. The wound measures 2cm length x 1.8cm width x 0.5cm depth; 2.827cm^2 area and 1.414cm^3 volume. There is Fat Layer (Subcutaneous Tissue) exposed. There is no tunneling or undermining noted. There is a medium amount of serosanguineous drainage noted. The wound margin is well defined and not attached to the wound base. There is large (67- 100%) red, pink granulation within the wound bed. There is a small (1-33%) amount of necrotic tissue within the wound bed including Adherent Slough. Wound #4 status is Open. Original cause of wound was Blister. The wound is located on the Right,Distal,Lateral Lower Leg. The wound measures 2.7cm length x 1.7cm width x 0.8cm depth; 3.605cm^2 area and 2.884cm^3 volume. There is Fat Layer (Subcutaneous Tissue) exposed. There is no tunneling or undermining noted. There is a medium amount of serosanguineous drainage noted. The wound margin is well defined and not attached to the wound base. There is large (67- 100%) red, pink granulation within the wound bed. There is a small (1-33%) amount of necrotic tissue within the wound bed including Adherent Slough. Wound #6 status is Open. Original cause of wound was Gradually Appeared.  The wound is located on the Left,Proximal,Lateral Lower Leg. The wound measures 1.5cm length x 1cm width x 0.4cm depth; 1.178cm^2 area and 0.471cm^3 volume. There is Fat Layer (Subcutaneous Tissue) exposed. There is no tunneling noted, however, there is undermining starting at 10:00 and ending at 5:00 with a maximum distance of 0.6cm. There is a small amount of serosanguineous drainage noted. The wound margin is well defined and not attached to the wound base. There is large (67-100%) red, pink granulation within the wound bed. There is no necrotic tissue within the wound bed. Assessment Active Problems ICD-10 Chronic venous hypertension (idiopathic) with inflammation of bilateral lower extremity Lymphedema, not elsewhere classified Non-pressure chronic ulcer of other part of left lower leg with fat  layer exposed Non-pressure chronic ulcer of other part of right lower leg with fat layer exposed Abrasion, left foot, subsequent encounter Procedures Wound #3 Pre-procedure diagnosis of Wound #3 is a Venous Leg Ulcer located on the Right,Proximal,Lateral Lower Leg .Severity of Tissue Pre Debridement is: Fat layer exposed. There was a Excisional Skin/Subcutaneous Tissue Debridement with a total area of 4 sq cm performed by Ricard Dillon., MD. With the following instrument(s): Curette to remove Viable and Non-Viable tissue/material. Material removed includes Subcutaneous Tissue, Slough, Skin: Dermis, and Fibrin/Exudate after achieving pain control using Lidocaine 4% Topical Solution. A time out was conducted at 08:15, prior to the start of the procedure. A Moderate amount of bleeding was controlled with Pressure. The procedure was tolerated well with a pain level of 0 throughout and a pain level of 2 following the procedure. Post Debridement Measurements: 2cm length x 1.8cm width x 0.5cm depth; 1.414cm^3 volume. Character of Wound/Ulcer Post Debridement is improved. Severity of Tissue Post  Debridement is: Fat layer exposed. 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 There was a Four Layer Compression Therapy Procedure with a pre-treatment ABI of 1.1 by Baruch Gouty, RN. Post procedure Diagnosis Wound #3: Same as Pre-Procedure Wound #4 Pre-procedure diagnosis of Wound #4 is a Venous Leg Ulcer located on the Right,Distal,Lateral Lower Leg .Severity of Tissue Pre Debridement is: Fat layer exposed. There was a Excisional Skin/Subcutaneous Tissue Debridement with a total area of 4.59 sq cm performed by Ricard Dillon., MD. With the following instrument(s): Curette to remove Viable and Non-Viable tissue/material. Material removed includes Subcutaneous Tissue, Slough, Skin: Dermis, and Fibrin/Exudate after achieving pain control using Lidocaine 4% T opical Solution. 1 specimen was taken by a Tissue Culture and sent to the lab per facility protocol. A time out was conducted at 08:15, prior to the start of the procedure. A Moderate amount of bleeding was controlled with Pressure. The procedure was tolerated well with a pain level of 0 throughout and a pain level of 2 following the procedure. Post Debridement Measurements: 2.7cm length x 1.7cm width x 0.8cm depth; 2.884cm^3 volume. Character of Wound/Ulcer Post Debridement is improved. Severity of Tissue Post Debridement is: Fat layer exposed. 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 There was a Four Layer Compression Therapy Procedure with a pre-treatment ABI of 1.1 by Baruch Gouty, RN. Post procedure Diagnosis Wound #4: Same as Pre-Procedure Wound #6 Pre-procedure diagnosis of Wound #6 is a Venous Leg Ulcer located on the Left,Proximal,Lateral Lower Leg . There was a Four Layer Compression  Therapy Procedure with a pre-treatment ABI of 1.1 by Baruch Gouty, RN. Post procedure Diagnosis Wound #6: Same as Pre-Procedure Plan Follow-up Appointments: Return Appointment in 1 week. Dressing Change Frequency: Other: - all wounds - change twice a week by home health Skin Barriers/Peri-Wound Care: Moisturizing lotion Wound Cleansing: May shower with protection. - use cast protectors on the days dressings are not changed. May shower and wash wound with soap and water. - with dressing changes only. Primary Wound Dressing: Wound #3 Right,Proximal,Lateral Lower Leg: Silver Collagen - moisten with hydrogel. Wound #4 Right,Distal,Lateral Lower Leg: Silver Collagen - moisten with hydrogel. Wound #6 Left,Proximal,Lateral Lower Leg: Silver Collagen - moisten with hydrogel. Secondary Dressing: Dry Gauze - all wounds ABD pad - all wounds Other: - pad  left medial first met head for protection. Edema Control: 4 layer compression - Bilateral - ***PLEASE ENSURE TO WRAP BOTH LEGS FROM JUST BELOW THE KNEE TO FOOT .*** Avoid standing for long periods of time Elevate legs to the level of the heart or above for 30 minutes daily and/or when sitting, a frequency of: - throughout the day. Home Health: [facility] for wound care. - [facility] Laboratory ordered were: Biopsy specimen culture - Biopsy of Wound #4 Right,Distal,Lateral Lower Leg. 1. Change the primary dressing to silver collagen 2. Consideration of a wound VAC on the right leg question snap VAC 3. We need clear instructions to home health to take the wraps up to the level just below his tibial tuberosity [i.e. just below the knee] we cannot have localized lymphedema above the wound. 4. We have already run advanced treatment product such as Apligraf, he has an unaffordable co-pay 5. Still in the same compression but needs to be brought up to a level just below his knee Electronic  Signature(s) Signed: 05/09/2020 1:37:47 PM By: Linton Ham MD Entered By: Linton Ham on 05/08/2020 08:49:43 -------------------------------------------------------------------------------- SuperBill Details Patient Name: Date of Service: [place], [place] 05/08/2020 Medical Record Number: [id number] Patient Account Number: [id number] Date of Birth/Sex: Treating RN: 1941-03-29 (79 y.o. [name], [name] Primary Care Provider: [name]., [name] Other Clinician: Referring Provider: Treating Provider/Extender: [name], [name] in Treatment: 30 Diagnosis Coding ICD-10 Codes Code Description [phone] Chronic venous hypertension (idiopathic) with inflammation of bilateral lower extremity I89.0 Lymphedema, not elsewhere classified L97.822 Non-pressure chronic ulcer of other part of left lower leg with fat layer exposed L97.812 Non-pressure chronic ulcer of other part of right lower leg with fat layer exposed S90.812D Abrasion, left foot, subsequent encounter Facility Procedures The patient participates with Medicare or their insurance follows the Medicare Facility Guidelines: CPT4 Code Description Modifier Quantity [id number] 11042 - DEB SUBQ TISSUE 20 SQ CM/< 1 ICD-10 Diagnosis Description T82.883 Non-pressure chronic ulcer of  other part of right lower leg with fat layer exposed The patient participates with Medicare or their insurance follows the Medicare Facility Guidelines: [id number] (Facility Use Only) 29581LT - APPLY MULTLAY COMPRS LWR LT LEG 59 1 Physician Procedures : CPT4 Code Description Modifier [id number] 11042 - WC PHYS SUBQ TISS 20 SQ CM ICD-10 Diagnosis Description A15.872 Non-pressure chronic ulcer of other part of right lower leg with fat layer exposed Quantity: 1 Electronic Signature(s) Signed: 05/09/2020 1:37:47 PM By: Linton Ham MD Entered By: Linton Ham on 05/08/2020 08:49:54

## 2020-05-09 NOTE — Progress Notes (Signed)
SCHON, ZEIDERS ([id number]) Visit Report for 05/08/2020 Arrival Information Details Patient Name: Date of Service: [place] 05/08/2020 8:00 [place] Number: [id number] Patient Account Number: [id number] Date of Birth/Sex: Treating RN: [birth year] (79 y.o. [name] Primary Care Mikayla Chiusano: [name]., [name] Other Clinician: Referring Sahaj Bona: Treating Matti Minney/Extender: [name], [name] in Treatment: 28 Visit Information History Since Last Visit Added or deleted any medications: No Patient Arrived: [name] Any new allergies or adverse reactions: No Arrival Time: 07:49 Had a fall or experienced change in No Accompanied By: self activities of daily living that may affect Transfer Assistance: None risk of falls: Patient Identification Verified: Yes Signs or symptoms of abuse/neglect since last visito No Secondary Verification Process Completed: Yes Hospitalized since last visit: No Patient Requires Transmission-Based Precautions: No Implantable device outside of the clinic excluding No Patient Has Alerts: Yes cellular tissue based products placed in the center Patient Alerts: Patient on Blood Thinner since last visit: Has Dressing in Place as Prescribed: Yes Has Compression in Place as Prescribed: Yes Pain Present Now: No Electronic Signature(s) Signed: 05/08/2020 5:17:55 PM By: Kela Millin Entered By: Kela Millin on 05/08/2020 07:49:59 -------------------------------------------------------------------------------- Compression Therapy Details Patient Name: Date of Service: [place] 05/08/2020 8:00 [place] Record Number: [id number] Patient Account Number: [id number] Date of Birth/Sex: Treating RN: 1941-02-01 (79 y.o. [name] Annalyse Langlais: [name]., [name] Other Clinician: Referring Kessa Fairbairn: Treating Hadlee Burback/Extender: [name], [name] in  Treatment: 30 Compression Therapy Performed for Wound Assessment: Wound #3 Right,Proximal,Lateral Lower Leg Performed By: Clinician Baruch Gouty, RN Compression Type: Four Layer Pre Treatment ABI: 1.1 Post Procedure Diagnosis Same as Pre-procedure Electronic Signature(s) Signed: 05/08/2020 5:40:39 PM By: Deon Pilling Entered By: Deon Pilling on 05/08/2020 08:27:26 -------------------------------------------------------------------------------- Compression Therapy Details Patient Name: Date of Service: [name] 05/08/2020 8:00 [place] Record Number: [id number] Patient Account Number: [id number] Date of Birth/Sex: Treating RN: April 17, 1941 (79 y.o. [name] Lyne Khurana: [name]., [name] Other Clinician: Referring Osmin Welz: Treating [name]/Extender: [name], [name] in Treatment: 30 Compression Therapy Performed for Wound Assessment: Wound #4 Right,Distal,Lateral Lower Leg Performed By: Clinician Baruch Gouty, RN Compression Type: Four Layer Pre Treatment ABI: 1.1 Post Procedure Diagnosis Same as Pre-procedure Electronic Signature(s) Signed: 05/08/2020 5:40:39 PM By: Deon Pilling Entered By: Deon Pilling on 05/08/2020 08:27:26 -------------------------------------------------------------------------------- Compression Therapy Details Patient Name: Date of Service: [name] 05/08/2020 8:00 [place] Record Number: [id number] Patient Account Number: [id number] Date of Birth/Sex: Treating RN: 01-Jul-1941 (79 y.o. [name] Markon Jares: [name]., [name] Other Clinician: Referring Prentis Langdon: Treating Kyanne Rials/Extender: [name], [name] in Treatment: 30 Compression Therapy Performed for Wound Assessment: Wound #6 Left,Proximal,Lateral Lower Leg Performed By: Clinician Baruch Gouty, RN Compression Type: Four Layer Pre Treatment ABI: 1.1 Post  Procedure Diagnosis Same as Pre-procedure Electronic Signature(s) Signed: 05/08/2020 5:40:39 PM By: Deon Pilling Entered By: Deon Pilling on 05/08/2020 08:27:27 -------------------------------------------------------------------------------- Encounter Discharge Information Details Patient Name: Date of Service: [place] 05/08/2020 8:00 [place] Record Number: [id number] Patient Account Number: [id number] Date of Birth/Sex: Treating RN: 06/03/1941 (79 y.o. [name] Primary Care Trestin Vences: [name]., [name] Other Clinician: Referring Yavuz Kirby: Treating Makyla Bye/Extender: [name], RO  [name] in Treatment: 30 Encounter Discharge Information Items Post Procedure Vitals Discharge Condition: Stable Temperature (F): 98 Ambulatory Status: Cane Pulse (bpm): 92 Discharge Destination: Home Respiratory Rate (breaths/min): 18 Transportation: Private Auto Blood Pressure (mmHg): 120/80 Accompanied By: self Schedule Follow-up Appointment: Yes Clinical Summary of Care: Patient Declined Electronic Signature(s) Signed: 05/08/2020 5:07:23 PM By: Baruch Gouty RN, BSN Entered By: Baruch Gouty on 05/08/2020 08:53:49 -------------------------------------------------------------------------------- Lower Extremity Assessment Details Patient Name: Date of Service: [place] 05/08/2020 8:00 [place] Record Number: [id number] Patient Account Number: [id number] Date of Birth/Sex: Treating RN: 1941-08-08 (79 y.o. [name] Primary Care Sharanya Templin: [name]., [name] Other Clinician: Referring Clary Meeker: Treating Jacksyn Beeks/Extender: [name], [name] in Treatment: 30 Edema Assessment Assessed: [Left: No] [Right: No] Edema: [Left: Yes] [Right: Yes] Calf Left: Right: Point of Measurement: 43 cm From Medial Instep 40 cm 39 cm Ankle Left: Right: Point of Measurement: 14 cm From Medial Instep 21 cm 24  cm Vascular Assessment Pulses: Dorsalis Pedis Palpable: [Left:No] [Right:No] Electronic Signature(s) Signed: 05/08/2020 5:17:55 PM By: Kela Millin Entered By: Kela Millin on 05/08/2020 08:01:48 -------------------------------------------------------------------------------- Multi Wound Chart Details Patient Name: Date of Service: [place] 05/08/2020 8:00 [place] Record Number: [id number] Patient Account Number: [id number] Date of Birth/Sex: Treating RN: 1940/11/13 (79 y.o. [name], [name] Primary Care Makell Cyr: [name]., [name] Other Clinician: Referring Eulalio Reamy: Treating Shelanda Duvall/Extender: [name], [name] in Treatment: 30 Vital Signs Height(in): 71 Pulse(bpm): 92 Weight(lbs): 198 Blood Pressure(mmHg): 120/80 Body Mass Index(BMI): 28 Temperature(F): 98 Respiratory Rate(breaths/min): 18 Photos: [1:No Photos Left, Lateral Lower Leg] [3:No Photos Right, Proximal, Lateral Lower Leg] [4:No Photos Right, Distal, Lateral Lower Leg] Wound Location: [1:Blister] [3:Blister] [4:Blister] Wounding Event: [1:Venous Leg Ulcer] [3:Venous Leg Ulcer] [4:Venous Leg Ulcer] Primary Etiology: [1:Anemia, Congestive Heart Failure,] [3:Anemia, Congestive Heart Failure,] [4:Anemia, Congestive Heart Failure,] Comorbid History: [1:Hypertension, Peripheral Venous Disease, 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] [3:Hypertension, Peripheral Venous Disease, End Stage Renal Disease Disease, 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] [4:Hypertension, Peripheral Venous 09/11/2019] Date Acquired: [1:30] [3:30] [4:30] Weeks of Treatment: [1:Open] [3:Open] [4:Open] Wound Status: [1:Yes] [3:No] [4:No] Clustered Wound: [1:1] [3:N/A] [4:N/A] Clustered Quantity: [1:0x0x0] [3:2x1.8x0.5] [4:2.7x1.7x0.8] Measurements L x W x D (cm) [1:0] [3:2.827] [4:3.605] A (cm) : rea [1:0] [3:1.414] [4:2.884] Volume (cm) : [1:100.00%] [3:42.90%] [4:61.20%] % Reduction in A [1:rea:  100.00%] [3:-185.70%] [4:-210.10%] % Reduction in Volume: [1:No] [3:No] [4:No] Undermining: [1:Full Thickness Without Exposed] [3:Full Thickness Without Exposed] [4:Full Thickness Without Exposed] Classification: [1:Support Structures None Present] [3:Support Structures Medium] [4:Support Structures Medium] Exudate A mount: [1:N/A] [3:Serosanguineous] [4:Serosanguineous] Exudate Type: [1:N/A] [3:red, brown] [4:red, brown] Exudate Color: [1:Flat and Intact] [3:Well defined, not attached] [4:Well defined, not attached] Wound Margin: [1:None Present (0%)] [3:Large (67-100%)] [4:Large (67-100%)] Granulation A mount: [1:N/A] [3:Red, Pink] [4:Red, Pink] Granulation Quality: [1:None Present (0%)] [3:Small (1-33%)] [4:Small (1-33%)] Necrotic A mount: [1:Fascia: No] [3:Fat Layer (Subcutaneous Tissue): Yes Fat Layer (Subcutaneous Tissue): Yes] Exposed Structures: [1:Fat Layer (Subcutaneous Tissue): No Tendon: No Muscle: No Joint: No Bone: No Large (67-100%)] [3:Fascia: No Tendon: No Muscle: No Joint: No Bone: No Small (1-33%)] [4:Fascia: No Tendon: No Muscle: No Joint: No Bone: No Small (1-33%)] Epithelialization: [1:N/A] [3:Debridement - Excisional] [4:Debridement - Excisional] Debridement: Pre-procedure Verification/Time Out N/A [3:08:15] [4:08:15] Taken: [1:N/A] [3:Lidocaine 4% Topical Solution] [4:Lidocaine 4% Topical Solution] Pain Control: [1:N/A] [3:Subcutaneous, Slough] [4:Subcutaneous, Slough] Tissue Debrided: [1:N/A] [3:Skin/Subcutaneous Tissue] [4:Skin/Subcutaneous Tissue]  Level: [1:N/A] [3:4] [4:4.59] Debridement A (sq cm): [1:rea N/A] [3:Curette] [4:Curette] Instrument: [1:N/A] [3:Moderate] [4:Moderate] Bleeding: [1:N/A] [3:Pressure] [4:Pressure] Hemostasis A chieved: [1:N/A] [3:0] [4:0] Procedural Pain: [1:N/A] [3:2] [4:2] Post Procedural Pain: [1:N/A] [3:Procedure was tolerated well] [4:Procedure was tolerated well] Debridement Treatment Response: [1:N/A] [3:2x1.8x0.5]  [4:2.7x1.7x0.8] Post Debridement Measurements L x W x D (cm) [1:N/A] [3:1.414] [4:2.884] Post Debridement Volume: (cm) [1:N/A] [3:Compression Therapy] [4:Compression Therapy] Procedures Performed: [3:Debridement 6 N/A] [4:Debridement N/A] Photos: [1:No Photos Left, Proximal, Lateral Lower Leg] [3:N/A N/A] [4:N/A N/A] Wound Location: [1:Gradually Appeared] [3:N/A] [4:N/A] Wounding Event: [1:Venous Leg Ulcer] [3:N/A] [4:N/A] Primary Etiology: [1:Anemia, Congestive Heart Failure,] [3:N/A] [4:N/A] Comorbid History: [1:Hypertension, Peripheral Venous Disease, End Stage Renal Disease 12/13/2019] [3:N/A] [4:N/A] Date Acquired: [1:21] [3:N/A] [4:N/A] Weeks of Treatment: [1:Open] [3:N/A] [4:N/A] Wound Status: [1:No] [3:N/A] [4:N/A] Clustered Wound: [1:N/A] [3:N/A] [4:N/A] Clustered Quantity: [1:1.5x1x0.4] [3:N/A] [4:N/A] Measurements L x W x D (cm) [1:1.178] [3:N/A] [4:N/A] A (cm) : rea [1:0.471] [3:N/A] [4:N/A] Volume (cm) : [1:64.70%] [3:N/A] [4:N/A] % Reduction in A rea: [1:52.90%] [3:N/A] [4:N/A] % Reduction in Volume: [1:10] Starting Position 1 (o'clock): [1:5] Ending Position 1 (o'clock): [1:0.6] Maximum Distance 1 (cm): [1:Yes] [3:N/A] [4:N/A] Undermining: [1:Full Thickness Without Exposed] [3:N/A] [4:N/A] Classification: [1:Support Structures Small] [3:N/A] [4:N/A] Exudate Amount: [1:Serosanguineous] [3:N/A] [4:N/A] Exudate Type: [1:red, brown] [3:N/A] [4:N/A] Exudate Color: [1:Well defined, not attached] [3:N/A] [4:N/A] Wound Margin: [1:Large (67-100%)] [3:N/A] [4:N/A] Granulation Amount: [1:Red, Pink] [3:N/A] [4:N/A] Granulation Quality: [1:None Present (0%)] [3:N/A] [4:N/A] Necrotic Amount: [1:Fat Layer (Subcutaneous Tissue): Yes N/A] [4:N/A] Exposed Structures: [1:Fascia: No Tendon: No Muscle: No Joint: No Bone: No Small (1-33%)] [3:N/A] [4:N/A] Epithelialization: [1:N/A] [3:N/A] [4:N/A] Debridement: [1:N/A] [3:N/A] [4:N/A] Pain Control: [1:N/A] [3:N/A] [4:N/A] Tissue  Debrided: [1:N/A] [3:N/A] [4:N/A] Level: [1:N/A] [3:N/A] [4:N/A] Debridement A (sq cm): [1:rea N/A] [3:N/A] [4:N/A] Instrument: [1:N/A] [3:N/A] [4:N/A] Bleeding: [1:N/A] [3:N/A] [4:N/A] Hemostasis A chieved: [1:N/A] [3:N/A] [4:N/A] Procedural Pain: [1:N/A] [3:N/A] [4:N/A] Post Procedural Pain: Debridement Treatment Response: N/A [3:N/A] [4:N/A] Post Debridement Measurements L x N/A [3:N/A] [4:N/A] W x D (cm) [1:N/A] [3:N/A] [4:N/A] Post Debridement Volume: (cm) [1:Compression Therapy] [3:N/A] [4:N/A] Treatment Notes Electronic Signature(s) Signed: 05/08/2020 5:40:39 PM By: Deon Pilling Signed: 05/09/2020 1:37:47 PM By: Linton Ham MD Entered By: Linton Ham on 05/08/2020 08:44:51 -------------------------------------------------------------------------------- Multi-Disciplinary Care Plan Details Patient Name: [place] of Service: [place] 05/08/2020 8:00 A M Medical Record Number: [id number] Patient Account Number: [id number] Date of Birth/Sex: Treating RN: 1941/03/02 (79 y.o. [name], [name] Primary Care Addalie Calles: [name]., [name] Other Clinician: Referring Cedrick Partain: Treating Bryahna Lesko/Extender: [name], [name] in Treatment: 30 Active Inactive Venous Leg Ulcer Nursing Diagnoses: Knowledge deficit related to disease process and management Potential for venous Insuffiency (use before diagnosis confirmed) Goals: Patient will maintain optimal edema control Date Initiated: 05/01/2020 Target Resolution Date: 05/29/2020 Goal Status: Active Interventions: Assess peripheral edema status every visit. Compression as ordered Provide education on venous insufficiency Notes: Wound/Skin Impairment Nursing Diagnoses: Knowledge deficit related to ulceration/compromised skin integrity Goals: Patient/caregiver will verbalize understanding of skin care regimen Date Initiated: 10/11/2019 Target Resolution Date: 05/29/2020 Goal Status:  Active Interventions: Assess patient/caregiver ability to obtain necessary supplies Assess patient/caregiver ability to perform ulcer/skin care regimen upon admission and as needed Assess ulceration(s) every visit Provide education on ulcer and skin care Treatment Activities: Skin care regimen initiated : 10/11/2019 Topical wound management initiated : 10/11/2019 Notes: Electronic Signature(s) Signed: 05/08/2020 5:40:39 PM By: Deon Pilling Entered By: Deon Pilling on 05/08/2020  07:52:48 -------------------------------------------------------------------------------- Pain Assessment Details Patient Name: [place] of Service: [place] 05/08/2020 8:00 [place] Number: [id number] Patient Account Number: [id number] Date of Birth/Sex: Treating RN: [birth year] (79 y.o. [name] Primary Care Veretta Sabourin: [name]., [name] Other Clinician: Referring Bristyl Mclees: Treating Kassidie Hendriks/Extender: [name], [name] in Treatment: 30 Active Problems Location of Pain Severity and Description of Pain Patient Has Paino No Site Locations Pain Management and Medication Current Pain Management: Electronic Signature(s) Signed: 05/08/2020 5:17:55 PM By: Kela Millin Entered By: Kela Millin on 05/08/2020 07:52:43 -------------------------------------------------------------------------------- Patient/Caregiver Education Details Patient Name: Date of Service: [place] 9/9/2021andnbsp8:00 A M Medical Record Number: [id number] Patient Account Number: [id number] Date of Birth/Gender: Treating RN: 23-Feb-1941 (79 y.o. [name] Physician: [name]., [name] Other Clinician: Referring Physician: Treating Physician/Extender: [name], [name] in Treatment: 24 Education Assessment Education Provided To: Patient Education Topics Provided Venous: Handouts: Managing Venous Disease and  Related Ulcers Methods: Explain/Verbal Responses: Reinforcements needed Electronic Signature(s) Signed: 05/08/2020 5:40:39 PM By: Deon Pilling Entered By: Deon Pilling on 05/08/2020 07:53:02 -------------------------------------------------------------------------------- Wound Assessment Details Patient Name: Date of Service: [name] 05/08/2020 8:00 [place] Record Number: [id number] Patient Account Number: [id number] Date of Birth/Sex: Treating RN: 02-06-1941 (79 y.o. [name] Primary Care Samuel Rittenhouse: [name]., [name] Other Clinician: Referring Joh Rao: Treating Euva Rundell/Extender: [name], [name] in Treatment: 30 Wound Status Wound Number: 1 Primary Venous Leg Ulcer Etiology: Wound Location: Left, Lateral Lower Leg Wound Open Wounding Event: Blister Status: Date Acquired: 09/11/2019 Comorbid Anemia, Congestive Heart Failure, Hypertension, Peripheral Weeks Of Treatment: 30 History: Venous Disease, End Stage Renal Disease Clustered Wound: Yes Photos Photo Uploaded By: Mikeal Hawthorne on 05/09/2020 11:24:38 Wound Measurements Length: (cm) Width: (cm) Depth: (cm) Clustered Quantity: Area: (cm) Volume: (cm) 0 % Reduction in Area: 100% 0 % Reduction in Volume: 100% 0 Epithelialization: Large (67-100%) 1 Tunneling: No 0 Undermining: No 0 Wound Description Classification: Full Thickness Without Exposed Support Structures Wound Margin: Flat and Intact Exudate Amount: None Present Foul Odor After Cleansing: No Slough/Fibrino No Wound Bed Granulation Amount: None Present (0%) Exposed Structure Necrotic Amount: None Present (0%) Fascia Exposed: No Fat Layer (Subcutaneous Tissue) Exposed: No Tendon Exposed: No Muscle Exposed: No Joint Exposed: No Bone Exposed: No Electronic Signature(s) Signed: 05/08/2020 5:17:55 PM By: Kela Millin Entered By: Kela Millin on 05/08/2020  08:05:08 -------------------------------------------------------------------------------- Wound Assessment Details Patient Name: [place] of Service: [place] 05/08/2020 8:00 [place] Record Number: [id number] Patient Account Number: [id number] Date of Birth/Sex: Treating RN: 03-Nov-1940 (79 y.o. [name] Primary Care Kateland Leisinger: [name]., [name] Other Clinician: Referring Fatim Vanderschaaf: Treating Resha Filippone/Extender: [name], [name] in Treatment: 30 Wound Status Wound Number: 3 Primary Venous Leg Ulcer Etiology: Wound Location: Right, Proximal, Lateral Lower Leg Wound Open Wounding Event: Blister Status: Date Acquired: 09/11/2019 Comorbid Anemia, Congestive Heart Failure, Hypertension, Peripheral Weeks Of Treatment: 30 History: Venous Disease, End Stage Renal Disease Clustered Wound: No Photos Photo Uploaded By: Mikeal Hawthorne on 05/09/2020 11:24:09 Wound Measurements Length: (cm) 2 Width: (cm) 1.8 Depth: (cm) 0.5 Area: (cm) 2.827 Volume: (cm) 1.414 % Reduction in Area: 42.9% % Reduction in Volume: -185.7% Epithelialization: Small (1-33%) Tunneling: No Undermining: No Wound Description Classification: Full Thickness Without Exposed Support Structures Wound Margin:  Well defined, not attached Exudate Amount: Medium Exudate Type: Serosanguineous Exudate Color: red, brown Foul Odor After Cleansing: No Slough/Fibrino Yes Wound Bed Granulation Amount: Large (67-100%) Exposed Structure Granulation Quality: Red, Pink Fascia Exposed: No Necrotic Amount: Small (1-33%) Fat Layer (Subcutaneous Tissue) Exposed: Yes Necrotic Quality: Adherent Slough Tendon Exposed: No Muscle Exposed: No Joint Exposed: No Bone Exposed: No Treatment Notes Wound #3 (Right, Proximal, Lateral Lower Leg) 2. Periwound Care Moisturizing lotion 3. Primary Dressing Applied Collegen AG 4. Secondary Dressing ABD Pad Other secondary dressing (specify  in notes) 6. Support Layer Applied 4 layer compression wrap Notes saline moistened gauze Electronic Signature(s) Signed: 05/08/2020 5:17:55 PM By: Kela Millin Entered By: Kela Millin on 05/08/2020 08:05:33 -------------------------------------------------------------------------------- Wound Assessment Details Patient Name: Date of Service: [place] 05/08/2020 8:00 [place] Record Number: [id number] Patient Account Number: [id number] Date of Birth/Sex: Treating RN: [birth year] (79 y.o. [name] Primary Care Kymani Shimabukuro: [name]., [name] Other Clinician: Referring Sueanne Maniaci: Treating Alvah Lagrow/Extender: [name], [name] in Treatment: 30 Wound Status Wound Number: 4 Primary Venous Leg Ulcer Etiology: Wound Location: Right, Distal, Lateral Lower Leg Wound Open Wounding Event: Blister Status: Date Acquired: 09/11/2019 Comorbid Anemia, Congestive Heart Failure, Hypertension, Peripheral Weeks Of Treatment: 30 History: Venous Disease, End Stage Renal Disease Clustered Wound: No Photos Photo Uploaded By: Mikeal Hawthorne on 05/09/2020 11:24:10 Wound Measurements Length: (cm) 2.7 Width: (cm) 1.7 Depth: (cm) 0.8 Area: (cm) 3.605 Volume: (cm) 2.884 % Reduction in Area: 61.2% % Reduction in Volume: -210.1% Epithelialization: Small (1-33%) Tunneling: No Undermining: No Wound Description Classification: Full Thickness Without Exposed Support Structures Wound Margin: Well defined, not attached Exudate Amount: Medium Exudate Type: Serosanguineous Exudate Color: red, brown Foul Odor After Cleansing: No Slough/Fibrino Yes Wound Bed Granulation Amount: Large (67-100%) Exposed Structure Granulation Quality: Red, Pink Fascia Exposed: No Necrotic Amount: Small (1-33%) Fat Layer (Subcutaneous Tissue) Exposed: Yes Necrotic Quality: Adherent Slough Tendon Exposed: No Muscle Exposed: No Joint Exposed: No Bone Exposed:  No Treatment Notes Wound #4 (Right, Distal, Lateral Lower Leg) 2. Periwound Care Moisturizing lotion 3. Primary Dressing Applied Collegen AG 4. Secondary Dressing ABD Pad Other secondary dressing (specify in notes) 6. Support Layer Applied 4 layer compression wrap Notes saline moistened gauze Electronic Signature(s) Signed: 05/08/2020 5:17:55 PM By: Kela Millin Entered By: Kela Millin on 05/08/2020 08:05:53 -------------------------------------------------------------------------------- Wound Assessment Details Patient Name: Date of Service: [place] 05/08/2020 8:00 [place] Record Number: [id number] Patient Account Number: [id number] Date of Birth/Sex: Treating RN: 16-Mar-1941 (79 y.o. [name] Primary Care Tujuana Kilmartin: [name]., [name] Other Clinician: Referring Justyn Boyson: Treating Domenic Schoenberger/Extender: [name], [name] in Treatment: 30 Wound Status Wound Number: 6 Primary Venous Leg Ulcer Etiology: Wound Location: Left, Proximal, Lateral Lower Leg Wound Open Wounding Event: Gradually Appeared Status: Date Acquired: 12/13/2019 Comorbid Anemia, Congestive Heart Failure, Hypertension, Peripheral Weeks Of Treatment: 21 History: Venous Disease, End Stage Renal Disease Clustered Wound: No Photos Photo Uploaded By: Mikeal Hawthorne on 05/09/2020 11:24:39 Wound Measurements Length: (cm) 1.5 Width: (cm) 1 Depth: (cm) 0.4 Area: (cm) 1.178 Volume: (cm) 0.471 % Reduction in Area: 64.7% % Reduction in Volume: 52.9% Epithelialization: Small (1-33%) Tunneling: No Undermining: Yes Starting Position (o'clock): 10 Ending Position (o'clock): 5 Maximum Distance: (cm) 0.6 Wound Description Classification: Full Thickness Without Exposed Support Structures Wound Margin: Well defined, not attached Exudate Amount: Small Exudate Type: Serosanguineous Exudate Color: red, brown Foul  Odor After Cleansing:  No Slough/Fibrino No Wound Bed Granulation Amount: Large (67-100%) Exposed Structure Granulation Quality: Red, Pink Fascia Exposed: No Necrotic Amount: None Present (0%) Fat Layer (Subcutaneous Tissue) Exposed: Yes Tendon Exposed: No Muscle Exposed: No Joint Exposed: No Bone Exposed: No Treatment Notes Wound #6 (Left, Proximal, Lateral Lower Leg) 2. Periwound Care Moisturizing lotion 3. Primary Dressing Applied Collegen AG 4. Secondary Dressing ABD Pad Other secondary dressing (specify in notes) 6. Support Layer Applied 4 layer compression wrap Notes saline moistened gauze Electronic Signature(s) Signed: 05/08/2020 5:17:55 PM By: Kela Millin Entered By: Kela Millin on 05/08/2020 08:07:53 -------------------------------------------------------------------------------- Vitals Details Patient Name: Date of Service: [place] 05/08/2020 8:00 [place] Number: [id number] Patient Account Number: [id number] Date of Birth/Sex: Treating RN: 08-25-1941 (79 y.o. [name] Primary Care Carrel Leather: Other Clinician: [name]., [name] Referring Aasia Peavler: Treating Corrinne Benegas/Extender: [name], [name] in Treatment: 30 Vital Signs Time Taken: 07:50 Temperature (F): 98 Height (in): 71 Pulse (bpm): 92 Weight (lbs): 198 Respiratory Rate (breaths/min): 18 Body Mass Index (BMI): 27.6 Blood Pressure (mmHg): 120/80 Reference Range: 80 - 120 mg / dl Electronic Signature(s) Signed: 05/08/2020 5:17:55 PM By: Kela Millin Entered By: Kela Millin on 05/08/2020 07:52:38

## 2020-05-15 ENCOUNTER — Encounter (HOSPITAL_BASED_OUTPATIENT_CLINIC_OR_DEPARTMENT_OTHER): Payer: Medicare Other | Admitting: Internal Medicine

## 2020-05-15 ENCOUNTER — Other Ambulatory Visit: Payer: Self-pay

## 2020-05-15 ENCOUNTER — Emergency Department (HOSPITAL_COMMUNITY): Payer: Medicare Other

## 2020-05-15 ENCOUNTER — Inpatient Hospital Stay (HOSPITAL_COMMUNITY)
Admission: EM | Admit: 2020-05-15 | Discharge: 2020-05-22 | DRG: 378 | Disposition: A | Discharge status: Home / Self Care | Payer: Medicare Other | Attending: Family Medicine | Admitting: Family Medicine

## 2020-05-15 ENCOUNTER — Encounter (HOSPITAL_COMMUNITY): Payer: Self-pay

## 2020-05-15 DIAGNOSIS — N184 Chronic kidney disease, stage 4 (severe): Secondary | ICD-10-CM | POA: Diagnosis not present

## 2020-05-15 DIAGNOSIS — I878 Other specified disorders of veins: Secondary | ICD-10-CM | POA: Diagnosis present

## 2020-05-15 DIAGNOSIS — Z83438 Family history of other disorder of lipoprotein metabolism and other lipidemia: Secondary | ICD-10-CM

## 2020-05-15 DIAGNOSIS — Z20822 Contact with and (suspected) exposure to covid-19: Secondary | ICD-10-CM | POA: Diagnosis present

## 2020-05-15 DIAGNOSIS — Z833 Family history of diabetes mellitus: Secondary | ICD-10-CM

## 2020-05-15 DIAGNOSIS — N1832 Chronic kidney disease, stage 3b: Secondary | ICD-10-CM | POA: Diagnosis not present

## 2020-05-15 DIAGNOSIS — D649 Anemia, unspecified: Secondary | ICD-10-CM

## 2020-05-15 DIAGNOSIS — Z8249 Family history of ischemic heart disease and other diseases of the circulatory system: Secondary | ICD-10-CM

## 2020-05-15 DIAGNOSIS — E782 Mixed hyperlipidemia: Secondary | ICD-10-CM | POA: Diagnosis present

## 2020-05-15 DIAGNOSIS — K5521 Angiodysplasia of colon with hemorrhage: Secondary | ICD-10-CM | POA: Diagnosis not present

## 2020-05-15 DIAGNOSIS — R0789 Other chest pain: Secondary | ICD-10-CM | POA: Diagnosis present

## 2020-05-15 DIAGNOSIS — L97929 Non-pressure chronic ulcer of unspecified part of left lower leg with unspecified severity: Secondary | ICD-10-CM | POA: Diagnosis not present

## 2020-05-15 DIAGNOSIS — Z79899 Other long term (current) drug therapy: Secondary | ICD-10-CM

## 2020-05-15 DIAGNOSIS — E785 Hyperlipidemia, unspecified: Secondary | ICD-10-CM | POA: Diagnosis present

## 2020-05-15 DIAGNOSIS — K449 Diaphragmatic hernia without obstruction or gangrene: Secondary | ICD-10-CM | POA: Diagnosis present

## 2020-05-15 DIAGNOSIS — K922 Gastrointestinal hemorrhage, unspecified: Secondary | ICD-10-CM | POA: Diagnosis present

## 2020-05-15 DIAGNOSIS — I5022 Chronic systolic (congestive) heart failure: Secondary | ICD-10-CM | POA: Diagnosis not present

## 2020-05-15 DIAGNOSIS — D631 Anemia in chronic kidney disease: Secondary | ICD-10-CM | POA: Diagnosis not present

## 2020-05-15 DIAGNOSIS — I890 Lymphedema, not elsewhere classified: Secondary | ICD-10-CM | POA: Diagnosis present

## 2020-05-15 DIAGNOSIS — Z87891 Personal history of nicotine dependence: Secondary | ICD-10-CM | POA: Diagnosis not present

## 2020-05-15 DIAGNOSIS — R778 Other specified abnormalities of plasma proteins: Secondary | ICD-10-CM

## 2020-05-15 DIAGNOSIS — D638 Anemia in other chronic diseases classified elsewhere: Secondary | ICD-10-CM | POA: Diagnosis not present

## 2020-05-15 DIAGNOSIS — E1151 Type 2 diabetes mellitus with diabetic peripheral angiopathy without gangrene: Secondary | ICD-10-CM | POA: Diagnosis present

## 2020-05-15 DIAGNOSIS — K254 Chronic or unspecified gastric ulcer with hemorrhage: Secondary | ICD-10-CM | POA: Diagnosis not present

## 2020-05-15 DIAGNOSIS — R079 Chest pain, unspecified: Secondary | ICD-10-CM

## 2020-05-15 DIAGNOSIS — I130 Hypertensive heart and chronic kidney disease with heart failure and stage 1 through stage 4 chronic kidney disease, or unspecified chronic kidney disease: Secondary | ICD-10-CM | POA: Diagnosis not present

## 2020-05-15 DIAGNOSIS — K259 Gastric ulcer, unspecified as acute or chronic, without hemorrhage or perforation: Secondary | ICD-10-CM

## 2020-05-15 DIAGNOSIS — E1122 Type 2 diabetes mellitus with diabetic chronic kidney disease: Secondary | ICD-10-CM | POA: Diagnosis present

## 2020-05-15 DIAGNOSIS — L97919 Non-pressure chronic ulcer of unspecified part of right lower leg with unspecified severity: Secondary | ICD-10-CM | POA: Diagnosis present

## 2020-05-15 DIAGNOSIS — N400 Enlarged prostate without lower urinary tract symptoms: Secondary | ICD-10-CM | POA: Diagnosis present

## 2020-05-15 DIAGNOSIS — I83009 Varicose veins of unspecified lower extremity with ulcer of unspecified site: Secondary | ICD-10-CM | POA: Diagnosis present

## 2020-05-15 DIAGNOSIS — Z7982 Long term (current) use of aspirin: Secondary | ICD-10-CM | POA: Diagnosis not present

## 2020-05-15 DIAGNOSIS — D62 Acute posthemorrhagic anemia: Secondary | ICD-10-CM | POA: Diagnosis not present

## 2020-05-15 DIAGNOSIS — N179 Acute kidney failure, unspecified: Secondary | ICD-10-CM | POA: Diagnosis present

## 2020-05-15 DIAGNOSIS — I10 Essential (primary) hypertension: Secondary | ICD-10-CM | POA: Diagnosis not present

## 2020-05-15 LAB — COMPREHENSIVE METABOLIC PANEL
ALT: 9 U/L (ref 0–44)
AST: 14 U/L — ABNORMAL LOW (ref 15–41)
Albumin: 2.9 g/dL — ABNORMAL LOW (ref 3.5–5.0)
Alkaline Phosphatase: 59 U/L (ref 38–126)
Anion gap: 8 (ref 5–15)
BUN: 96 mg/dL — ABNORMAL HIGH (ref 8–23)
CO2: 22 mmol/L (ref 22–32)
Calcium: 8.4 mg/dL — ABNORMAL LOW (ref 8.9–10.3)
Chloride: 106 mmol/L (ref 98–111)
Creatinine, Ser: 2.11 mg/dL — ABNORMAL HIGH (ref 0.61–1.24)
GFR calc Af Amer: 34 mL/min — ABNORMAL LOW (ref >60)
GFR calc non Af Amer: 30 mL/min — ABNORMAL LOW (ref >60)
Glucose, Bld: 105 mg/dL — ABNORMAL HIGH (ref 70–99)
Potassium: 5 mmol/L (ref 3.5–5.1)
Sodium: 136 mmol/L (ref 135–145)
Total Bilirubin: 1 mg/dL (ref 0.3–1.2)
Total Protein: 7.4 g/dL (ref 6.5–8.1)

## 2020-05-15 LAB — CBC WITH DIFFERENTIAL/PLATELET
Abs Immature Granulocytes: 0.03 10*3/uL (ref 0.00–0.07)
Basophils Absolute: 0 10*3/uL (ref 0.0–0.1)
Basophils Relative: 0 %
Eosinophils Absolute: 0 10*3/uL (ref 0.0–0.5)
Eosinophils Relative: 1 %
HCT: 18.6 % — ABNORMAL LOW (ref 39.0–52.0)
Hemoglobin: 5.8 g/dL — CL (ref 13.0–17.0)
Immature Granulocytes: 1 %
Lymphocytes Relative: 9 %
Lymphs Abs: 0.5 10*3/uL — ABNORMAL LOW (ref 0.7–4.0)
MCH: 29.7 pg (ref 26.0–34.0)
MCHC: 31.2 g/dL (ref 30.0–36.0)
MCV: 95.4 fL (ref 80.0–100.0)
Monocytes Absolute: 0.3 10*3/uL (ref 0.1–1.0)
Monocytes Relative: 5 %
Neutro Abs: 4.7 10*3/uL (ref 1.7–7.7)
Neutrophils Relative %: 84 %
Platelets: 195 10*3/uL (ref 150–400)
RBC: 1.95 MIL/uL — ABNORMAL LOW (ref 4.22–5.81)
RDW: 15.6 % — ABNORMAL HIGH (ref 11.5–15.5)
WBC: 5.5 10*3/uL (ref 4.0–10.5)
nRBC: 0 % (ref 0.0–0.2)

## 2020-05-15 LAB — LIPASE, BLOOD: Lipase: 26 U/L (ref 11–51)

## 2020-05-15 LAB — PREPARE RBC (CROSSMATCH)

## 2020-05-15 LAB — SARS CORONAVIRUS 2 BY RT PCR (HOSPITAL ORDER, PERFORMED IN ~~LOC~~ HOSPITAL LAB): SARS Coronavirus 2: NEGATIVE

## 2020-05-15 LAB — BRAIN NATRIURETIC PEPTIDE: B Natriuretic Peptide: 2167.3 pg/mL — ABNORMAL HIGH (ref 0.0–100.0)

## 2020-05-15 LAB — PROTIME-INR
INR: 1.1 (ref 0.8–1.2)
Prothrombin Time: 14 seconds (ref 11.4–15.2)

## 2020-05-15 LAB — TROPONIN I (HIGH SENSITIVITY)
Troponin I (High Sensitivity): 37 ng/L — ABNORMAL HIGH (ref <18)
Troponin I (High Sensitivity): 68 ng/L — ABNORMAL HIGH (ref <18)

## 2020-05-15 LAB — GLUCOSE, CAPILLARY: Glucose-Capillary: 78 mg/dL (ref 70–99)

## 2020-05-15 LAB — POC OCCULT BLOOD, ED: Fecal Occult Bld: POSITIVE — AB

## 2020-05-15 LAB — HEMOGLOBIN AND HEMATOCRIT, BLOOD
HCT: 22.3 % — ABNORMAL LOW (ref 39.0–52.0)
Hemoglobin: 7.3 g/dL — ABNORMAL LOW (ref 13.0–17.0)

## 2020-05-15 LAB — MRSA PCR SCREENING: MRSA by PCR: NEGATIVE

## 2020-05-15 MED ORDER — SODIUM CHLORIDE 0.9 % IV SOLN
80.0000 mg | Freq: Once | INTRAVENOUS | Status: AC
  Administered 2020-05-15: 11:00:00 80 mg via INTRAVENOUS
  Filled 2020-05-15: qty 80

## 2020-05-15 MED ORDER — FUROSEMIDE 10 MG/ML IJ SOLN
40.0000 mg | Freq: Once | INTRAMUSCULAR | Status: AC
  Administered 2020-05-15: 40 mg via INTRAVENOUS
  Filled 2020-05-15: qty 4

## 2020-05-15 MED ORDER — SODIUM CHLORIDE 0.9% FLUSH
10.0000 mL | INTRAVENOUS | Status: DC | PRN
  Administered 2020-05-19: 10 mL

## 2020-05-15 MED ORDER — SODIUM CHLORIDE 0.9 % IV SOLN
10.0000 mL/h | Freq: Once | INTRAVENOUS | Status: AC
  Administered 2020-05-15: 10 mL/h via INTRAVENOUS

## 2020-05-15 MED ORDER — METOPROLOL TARTRATE 5 MG/5ML IV SOLN
2.5000 mg | Freq: Two times a day (BID) | INTRAVENOUS | Status: DC
  Administered 2020-05-15 (×2): 2.5 mg via INTRAVENOUS
  Filled 2020-05-15 (×3): qty 5

## 2020-05-15 MED ORDER — SODIUM CHLORIDE 0.9 % IV SOLN
8.0000 mg/h | INTRAVENOUS | Status: AC
  Administered 2020-05-15 – 2020-05-18 (×7): 8 mg/h via INTRAVENOUS
  Filled 2020-05-15 (×4): qty 80
  Filled 2020-05-15: qty 72
  Filled 2020-05-15 (×7): qty 80

## 2020-05-15 MED ORDER — CHLORHEXIDINE GLUCONATE CLOTH 2 % EX PADS
6.0000 | MEDICATED_PAD | Freq: Every day | CUTANEOUS | Status: DC
  Administered 2020-05-15 – 2020-05-22 (×7): 6 via TOPICAL

## 2020-05-15 MED ORDER — SODIUM CHLORIDE 0.9% FLUSH
3.0000 mL | Freq: Two times a day (BID) | INTRAVENOUS | Status: DC
  Administered 2020-05-15 – 2020-05-21 (×13): 3 mL via INTRAVENOUS

## 2020-05-15 MED ORDER — ORAL CARE MOUTH RINSE
15.0000 mL | Freq: Two times a day (BID) | OROMUCOSAL | Status: DC
  Administered 2020-05-15 – 2020-05-22 (×14): 15 mL via OROMUCOSAL

## 2020-05-15 MED ORDER — SODIUM CHLORIDE 0.9% FLUSH
10.0000 mL | Freq: Two times a day (BID) | INTRAVENOUS | Status: DC
  Administered 2020-05-15 – 2020-05-21 (×8): 10 mL

## 2020-05-15 NOTE — ED Provider Notes (Signed)
[facility] DEPT Provider Note   CSN: [id number] Arrival date & time: 05/15/20  0855     History Chief Complaint  Patient presents with  . Chest Pain  . abdominal distention  . GI Bleeding    [name] is a 79 y.o. male.  He has multiple medical conditions including CHF with a low EF, CKD, lymphedema with wounds on his legs.  He was sent down from the wound clinic today for chief complaint of feeling a brick on his chest starting last evening intermittent along with black and bloody loose stools.  Does have intermittent shortness of breath.  Feels fatigued.  No known fevers.  Denies any abdominal pain.  Has not been Covid vaccinated.  The history is provided by the patient.  Chest Pain Pain location:  Substernal area Pain quality: pressure   Pain radiates to:  Does not radiate Pain severity:  Moderate Onset quality:  Gradual Duration:  2 days Timing:  Intermittent Progression:  Unchanged Chronicity:  New Context: at rest   Worsened by:  Exertion Ineffective treatments:  None tried Associated symptoms: fatigue, lower extremity edema and shortness of breath   Associated symptoms: no abdominal pain, no cough, no diaphoresis, no fever, no headache, no nausea and no vomiting   Risk factors: coronary artery disease, diabetes mellitus, high cholesterol, hypertension and male sex   Diarrhea Quality:  Black and tarry and blood-tinged Severity:  Moderate Onset quality:  Gradual Duration:  2 days Timing:  Intermittent Progression:  Unchanged Relieved by:  Nothing Worsened by:  Nothing Ineffective treatments:  None tried Associated symptoms: no abdominal pain, no diaphoresis, no fever, no headaches and no vomiting        Past Medical History:  Diagnosis Date  . Acute cholecystitis 11/23/2017  . Acute respiratory failure with hypoxia ([place])   . Acute upper back pain   . AKI (acute kidney injury) ([place])   . Alcohol abuse 02/26/2015  .  Anemia, deficiency   . Back pain    LUMBOSACRAL  . Bronchitis, complicated   . Cardiomegaly   . Chronic combined systolic and diastolic CHF (congestive heart failure) ([place])   . Chronic pain    HIGH RISK MED USE COMPREHENSIVE HIGH RISK  . CKD (chronic kidney disease), stage III   . Demand ischemia ([place])    a. minimally elevated troponin peak 0.11 in 2016, felt demand ischemia. Cath offered but pt wished to have done back in [place].  . Diabetes mellitus without complication ([place])   . Edema, leg   . Elevated troponin 02/26/2015  . Essential hypertension   . Fatigue   . Heart abnormality   . Hyperlipidemia   . Hypertension   . Neck pain, acute   . PVD (peripheral vascular disease) ([place])   . Renal lesion 11/23/2017  . SOB (shortness of breath) on exertion   . Solitary kidney   . Vitamin D deficiency     Patient Active Problem List   Diagnosis Date Noted  . GI bleed with acute blood loss anemia, suspected upper 06/30/2019  . Dysphagia   . Palliative care encounter   . Cardiogenic shock ([place])   . Abdominal pain 06/19/2019  . Acute lower UTI 06/19/2019  . Choledocholithiasis   . Cholangitis due to bile duct calculus with obstruction   . Acute renal failure (ARF) ([place]) 02/05/2019  . Acute on chronic congestive heart failure ([place]) 02/04/2019  . Anemia of chronic disease 03/08/2018  . Acute cholecystitis 11/23/2017  .  Renal lesion 11/23/2017  . Alcohol abuse 02/26/2015  . Hyperlipidemia   . Diabetes mellitus without complication ([name])   . CKD (chronic kidney disease), stage III ([place])   . AKI (acute kidney injury) ([place])   . Acute on chronic systolic and diastolic heart failure, NYHA class 1 ([place])   . Essential hypertension     Past Surgical History:  Procedure Laterality Date  . ENDOSCOPIC RETROGRADE CHOLANGIOPANCREATOGRAPHY (ERCP) WITH PROPOFOL N/A 06/19/2019   Procedure: ENDOSCOPIC RETROGRADE CHOLANGIOPANCREATOGRAPHY (ERCP) WITH PROPOFOL;  Surgeon: Milus Banister, MD;   Location: [facility] ENDOSCOPY;  Service: Endoscopy;  Laterality: N/A;  . ESOPHAGOGASTRODUODENOSCOPY (EGD) WITH PROPOFOL N/A 07/01/2019   Procedure: ESOPHAGOGASTRODUODENOSCOPY (EGD) WITH PROPOFOL;  Surgeon: Rush Landmark Telford Nab., MD;  Location: [facility];  Service: Gastroenterology;  Laterality: N/A;  . IR CHOLANGIOGRAM EXISTING TUBE  12/27/2017  . IR PERC CHOLECYSTOSTOMY  11/25/2017  . IR RADIOLOGIST EVAL & MGMT  02/02/2018  . REMOVAL OF STONES  06/19/2019   Procedure: REMOVAL OF STONES;  Surgeon: Milus Banister, MD;  Location: [facility] ENDOSCOPY;  Service: Endoscopy;;  . Joan Mayans  06/19/2019   Procedure: Joan Mayans;  Surgeon: Milus Banister, MD;  Location: [facility] ENDOSCOPY;  Service: Endoscopy;;       Family History  Problem Relation Age of Onset  . Other Mother        died in her sleep at 67  . Hypertension Sister   . Hyperlipidemia Sister   . Diabetes Mellitus II Sister   . Heart disease Sister   . CAD Neg Hx        neg hx premature CAD    Social History   Tobacco Use  . Smoking status: Former Smoker    Types: Cigarettes    Quit date: 02/26/2008    Years since quitting: 12.2  . Smokeless tobacco: Never Used  Vaping Use  . Vaping Use: Never used  Substance Use Topics  . Alcohol use: Yes    Comment: occ  . Drug use: No    Home Medications Prior to Admission medications   Medication Sig Start Date End Date Taking? Authorizing Provider  alum & mag hydroxide-simeth (MAALOX/MYLANTA) 200-200-20 MG/5ML suspension Take 15 mLs by mouth every 4 (four) hours as needed for indigestion or heartburn. 07/08/19   Elie Confer, MD  carvedilol (COREG) 3.125 MG tablet Take 1 tablet (3.125 mg total) by mouth 2 (two) times daily. 07/08/19 07/07/20  Elie Confer, MD  feeding supplement, ENSURE ENLIVE, (ENSURE ENLIVE) LIQD Take 237 mLs by mouth 2 (two) times daily between meals. 07/09/19   Elie Confer, MD  pantoprazole (PROTONIX) 40 MG tablet Take 1 tablet (40 mg total) by mouth 2  (two) times daily before a meal. 07/09/19   [place], Waneta Martins, MD  polyethylene glycol (MIRALAX / GLYCOLAX) 17 g packet Take 17 g by mouth daily as needed for moderate constipation. 07/08/19   Elie Confer, MD  simethicone (MYLICON) 80 MG chewable tablet Chew 2 tablets (160 mg total) by mouth 4 (four) times daily -  before meals and at bedtime. 07/08/19   Elie Confer, MD  tamsulosin (FLOMAX) 0.4 MG CAPS capsule Take 1 capsule (0.4 mg total) by mouth daily. 07/08/19   Elie Confer, MD  torsemide (DEMADEX) 20 MG tablet Take 2 tablets (40 mg total) by mouth daily. 07/09/19 08/08/19  Elie Confer, MD  warfarin (COUMADIN) 7.5 MG tablet Take 1 tablet (7.5 mg total) by mouth one time only at 6 PM.  07/08/19 08/07/19  Elie Confer, MD    Allergies    Patient has no known allergies.  Review of Systems   Review of Systems  Constitutional: Positive for fatigue. Negative for diaphoresis and fever.  HENT: Negative for sore throat.   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  Respiratory: Positive for shortness of breath. Negative for cough.   Cardiovascular: Positive for chest pain and leg swelling.  Gastrointestinal: Positive for diarrhea. Negative for abdominal pain, nausea and vomiting.  Genitourinary: Negative for dysuria.  Musculoskeletal: Positive for gait problem.  Skin: Positive for wound (bilat legs). Negative for rash.  Neurological: Negative for headaches.    Physical Exam Updated Vital Signs BP (!) 129/55 (BP Location: Left Arm)   Pulse 92   Temp 97.6 F (36.4 C) (Oral)   Resp 20   Ht 5\' 11"  (1.803 m)   Wt 88.9 kg   SpO2 99%   BMI 27.34 kg/m   Physical Exam Vitals and nursing note reviewed.  Constitutional:      Appearance: Normal appearance. He is well-developed.  HENT:     Head: Normocephalic and atraumatic.  Eyes:     Conjunctiva/sclera: Conjunctivae normal.     Comments: Cloudy right cornea  Cardiovascular:     Rate and Rhythm: Normal  rate and regular rhythm.     Heart sounds: No murmur heard.   Pulmonary:     Effort: Pulmonary effort is normal. No respiratory distress.     Breath sounds: Normal breath sounds.  Abdominal:     General: There is distension.     Palpations: Abdomen is soft.     Tenderness: There is no abdominal tenderness. There is no guarding or rebound.     Hernia: A hernia (umbilical) is present.  Musculoskeletal:        General: No deformity.     Cervical back: Neck supple.     Right lower leg: Edema present.     Left lower leg: Edema present.  Skin:    General: Skin is warm and dry.     Capillary Refill: Capillary refill takes less than 2 seconds.  Neurological:     General: No focal deficit present.     Mental Status: He is alert. Mental status is at baseline.     ED Results / Procedures / Treatments   Labs (all labs ordered are listed, but only abnormal results are displayed) Labs Reviewed  COMPREHENSIVE METABOLIC PANEL - Abnormal; Notable for the following components:      Result Value   Glucose, Bld 105 (*)    BUN 96 (*)    Creatinine, Ser 2.11 (*)    Calcium 8.4 (*)    Albumin 2.9 (*)    AST 14 (*)    GFR calc non Af Amer 30 (*)    GFR calc Af Amer 34 (*)    All other components within normal limits  CBC WITH DIFFERENTIAL/PLATELET - Abnormal; Notable for the following components:   RBC 1.95 (*)    Hemoglobin 5.8 (*)    HCT 18.6 (*)    RDW 15.6 (*)    Lymphs Abs 0.5 (*)    All other components within normal limits  BRAIN NATRIURETIC PEPTIDE - Abnormal; Notable for the following components:   B Natriuretic Peptide 2,167.3 (*)    All other components within normal limits  POC OCCULT BLOOD, ED - Abnormal; Notable for the following components:   Fecal Occult Bld POSITIVE (*)  All other components within normal limits  TROPONIN I (HIGH SENSITIVITY) - Abnormal; Notable for the following components:   Troponin I (High Sensitivity) 37 (*)    All other components within normal  limits  TROPONIN I (HIGH SENSITIVITY) - Abnormal; Notable for the following components:   Troponin I (High Sensitivity) 68 (*)    All other components within normal limits  SARS CORONAVIRUS 2 BY RT PCR (HOSPITAL ORDER, [facility])  MRSA PCR SCREENING  PROTIME-INR  LIPASE, BLOOD  GLUCOSE, CAPILLARY  HEMOGLOBIN AND HEMATOCRIT, BLOOD  BASIC METABOLIC PANEL  CBC  TYPE AND SCREEN  PREPARE RBC (CROSSMATCH)    EKG EKG Interpretation  Date/Time:  Thursday May 15 2020 09:04:52 EDT Ventricular Rate:  92 PR Interval:    QRS Duration: 109 QT Interval:  385 QTC Calculation: 477 R Axis:   -65 Text Interpretation: Sinus rhythm Atrial premature complex Prolonged PR interval Left anterior fascicular block Anteroseptal infarct, old increased PR from prior 10/20 Confirmed by Aletta Edouard [phone]) on 05/15/2020 9:34:03 AM   Radiology DG Chest 2 View  Result Date: 05/15/2020 CLINICAL DATA:  Chest pain. EXAM: CHEST - 2 VIEW COMPARISON:  June 18, 2019. FINDINGS: Mild cardiomegaly is noted. No pneumothorax or pleural effusion is noted. Both lungs are clear. The visualized skeletal structures are unremarkable. IMPRESSION: No active cardiopulmonary disease. Electronically Signed   By: Marijo Conception M.D.   On: 05/15/2020 09:45    Procedures .Critical Care Performed by: Hayden Rasmussen, MD Authorized by: Hayden Rasmussen, MD   Critical care provider statement:    Critical care time (minutes):  45   Critical care time was exclusive of:  Separately billable procedures and treating other patients   Critical care was necessary to treat or prevent imminent or life-threatening deterioration of the following conditions:  Circulatory failure and cardiac failure   Critical care was time spent personally by me on the following activities:  Discussions with consultants, evaluation of patient's response to treatment, examination of patient, ordering and performing  treatments and interventions, ordering and review of laboratory studies, ordering and review of radiographic studies, pulse oximetry, re-evaluation of patient's condition, obtaining history from patient or surrogate, review of old charts and development of treatment plan with patient or surrogate   (including critical care time)  Medications Ordered in ED Medications  pantoprazole (PROTONIX) 80 mg in sodium chloride 0.9 % 100 mL (0.8 mg/mL) infusion (8 mg/hr Intravenous Rate/Dose Change 05/15/20 1655)  metoprolol tartrate (LOPRESSOR) injection 2.5 mg (2.5 mg Intravenous Given 05/15/20 1717)  sodium chloride flush (NS) 0.9 % injection 3 mL (3 mLs Intravenous Given 05/15/20 1718)  sodium chloride flush (NS) 0.9 % injection 10-40 mL (10 mLs Intracatheter Given 05/15/20 1719)  sodium chloride flush (NS) 0.9 % injection 10-40 mL (has no administration in time range)  Chlorhexidine Gluconate Cloth 2 % PADS 6 each (6 each Topical Given 05/15/20 1719)  MEDLINE mouth rinse (has no administration in time range)  pantoprazole (PROTONIX) 80 mg in sodium chloride 0.9 % 100 mL IVPB (0 mg Intravenous Stopped 05/15/20 1132)  0.9 %  sodium chloride infusion (10 mL/hr Intravenous New Bag/Given (Non-Interop) 05/15/20 1335)  furosemide (LASIX) injection 40 mg (40 mg Intravenous Given 05/15/20 1717)    ED Course  I have reviewed the triage vital signs and the nursing notes.  Pertinent labs & imaging results that were available during my care of the patient were reviewed by me and considered in my medical  decision making (see chart for details).  Clinical Course as of May 15 1902  Thu May 15, 2020  1610 Reviewed prior notes in epic.  It appears that the patient is on warfarin for LV thrombus.  Had significant GI bleed about a year ago and was admitted for shock secondary to cholangitis.   [MB]  [id number] Chest x-ray interpreted by me as cardiomegalyNo gross infiltrates or pneumothorax.   [MB]  1038 Rectal exam done with  nurse as chaperone.  Normal sphincter tone.  Sample sent to lab for guaiac.   [MB]  1105 Hemoglobin came back critically low at 5.8.  Creatinine a little bit below baseline but BUN much more elevated so consistent with GI bleed.  INR is only 1.1 question whether he still takes warfarin.  I asked the patient but he is not sure.  We will have pharmacy reconcile his medications.  Troponin elevated at 37 could be ischemia but probably demand from his low hemoglobin.  Have paged low-power GI.  Instituted Protonix drip.  Ordered 2 units of packed cells.   [MB]  1114 Discussed with PA [facility].  She agrees with current plan and will see the patient in consult.   [MB]  1121 Discussed with Dr. Neysa Bonito [facility] hospitalist who will evaluate the patient for admission.   [MB]  1133 Received a call back from [facility].  They said they saw the patient as unassigned consult during last admission and he has not followed up with them so he is not their patient.  Recommended calling unassigned GI again   [MB]  9604 Discussed with Dr. Benson Norway from [facility].  He will consult on the patient.   [MB]    Clinical Course User Index [MB] Hayden Rasmussen, MD   MDM Rules/Calculators/A&P                         This patient complains of melanotic stools, fatigue, chest pressure; this involves an extensive number of treatment Options and is a complaint that carries with it a high risk of complications and Morbidity. The differential includes GI bleed, ACS, symptomatic anemia, reflux, metabolic derangement, CHF  I ordered, reviewed and interpreted labs, which included CBC with normal white count, new low hemoglobin down 3 points from baseline, normal coags, BNP elevated, troponin elevated and rising, fecal occult positive I ordered medication IV Protonix, PRBC transfusion I ordered imaging studies which included chest x-ray and I independently    visualized and interpreted imaging which showed  cardiomegaly no gross infiltrates Additional history obtained from letter from wound care center where patient was just out and referred to ED for possible GI bleed Previous records obtained and reviewed in epic including prior admission 1 year ago for GI bleed and sepsis in the setting of acute ascending cholangitis I consulted gastroenterology Dr. Benson Norway and [facility] hospitalist Dr. Neysa Bonito and discussed lab and imaging findings  Critical Interventions: Recognition and treatment of acute GI bleed and anemia along with possible ACS After the interventions stated above, I reevaluated the patient and found patient to be minimally symptomatic currently.  Blood pressures remained stable so far.  Due to his profound cardiomyopathy will transfuse slowly and will need diuresis in between.   Final Clinical Impression(s) / ED Diagnoses Final diagnoses:  Acute GI bleeding  Elevated troponin  Chest pain, unspecified type    Rx / DC Orders ED Discharge Orders    None  Hayden Rasmussen, MD 05/15/20 [phone]

## 2020-05-15 NOTE — Progress Notes (Signed)
On admission to ICU, noted bilateral lower extremity leg wrapping. Per previous notes, wound care has been consulted and assessed wounds. Patient also states he came from wound care center for dressing change prior to being in the emergency room. States dressings were changed this morning at his appointment. WOC has orders supplies to change but are not at bedside at this time- will leave dressings intact at this time

## 2020-05-15 NOTE — Progress Notes (Signed)
[name] ([id number]) Visit Report for 05/15/2020 HPI Details Patient Name: Date of Service: [name] 05/15/2020 8:00 [place] Record Number: [id number] Patient Account Number: [id number] Date of Birth/Sex: Treating RN: 1941/01/28 (79 y.o. [name], [name] Primary Care Provider: [name]., [name] Other Clinician: Referring Provider: Treating Provider/Extender: [name], [name] in Treatment: 31 History of Present Illness HPI Description: ADMISSION 10/11/2019 This is a 79 year old man with a history of a severe cardiomyopathy with an ejection fraction of about 20%, chronic renal failure stage III. He is listed as a type II diabetic in epic although the patient denies this. He also has a history of PVD. He states for the last month he has had wounds on his bilateral lower extremities that started off as blisters which denuded. He has areas on the left lateral calf and 2 on the right lateral. He has an area on the left first met head which he did not know was there he we identified this on intake. He has been using Silvadene cream provided by his primary care physician but he is complaining that this burns. 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 He was acutely ill at that time complicating GI bleed. ABIs in our clinic were 1.16 on the right and 1.13 on the left 2/25; the patient comes in with his areas on the left lateral and right lateral calf. There is also an area over the left first MTP bunion deformity. We have been using Sorbact. His edema control is fairly good 3/4; left lateral and  right lateral calf. Most of his wounds are in the same position tightly adherent nonviable debris. On the right we debrided the superior wound on the left both wounds. The area on his bunion over the left first MTP medially is I think just about closed. We have been using Sorbact without a lot of success changed to Iodoflex under compression 3/11; left lateral and right lateral calf perhaps minor improvement in the surface condition. We have been using Iodoflex. The area over the bunion of the left first MTP has closed over 3/18; left lateral and right lateral calf not much improvement. We have been using Iodoflex. Aggressive debridement last week. 3/25; left lateral and right lateral calf. We have been using Iodoflex. There is some improvement in the superior area on the right and 2 on the lateral left although there is still a lot of debris on the surface. The inferior area on the right is still a completely nonviable surface. 4/8; left lateral and right lateral calfs. Essentially mirror-image looking wounds 2 wounds on each side in close juxtaposition we have been using Iodoflex with some improvement in the very adherent fibrinous debris but not a lot. The patient has arterial studies next Wednesday morning and venous studies next Thursday morning. I have been avoiding any further aggressive debridement until we see the arterial study results. His ABIs were fairly good in this clinic and is dorsalis pedis pulses are palpable but the wound beds are pale. 4/15; left lateral and right lateral calfs. Essentially mirror-image wounds with 2 wounds on each side in close juxtaposition  but with rims of separating normal tissue. We have been using Iodoflex. The base of the wounds has been cleaning up quite nicely ARTERIAL STUDIES were done showing the patient had an ABI on the right of 1.27 with triphasic waveforms and a TBI of 0.90 on the left triphasic waveforms with an ABI of 1.28 and a TBI of 0.87. No  evidence of arterial disease VENOUS REFLUX STUDIES; were done yesterday we do not have this report yet. 4/23; VENOUS REFLUX STUDIES did not show any significant reflux right lower extremity. No evidence of a DVT He did have significant reflux in the common . femoral vein on the left but nothing else was listed as significant. He did not have a DVT. We have been using Iodoflex under compression his wounds are making progress. 4/29; improvements in the wound surface continue. We have been using Iodoflex. He has a 20% out-of-pocket co-pay for Apligraf which is unfortunate. We changed him to Sorbact today 5/6; started on Sorbact last week. Better looking wound surface but not much change in dimensions. 01/24/20-Patient is back at 3 weeks, wound surfaces are about the same but very minimal slough on the right leg wounds, however there is blistering on both legs adjacent to the wounds, patient denies any other symptoms or new symptoms. His studies have been reviewed and do not indicate any significant arterial or venous disease. But he is attending once in 3 weeks with home health changing in between 6/3; patient has 4 wounds 2 on each side of his lateral lower legs. These wounds are somewhat improved. He apparently arrived in clinic last week with a large blister on the right lateral lower leg that had denuded into the wound. They changed to silver alginate. Still under compression. He has straight Medicare and unfortunately has an unlimited co-pay for advanced treatment options. 6/10; his original 4 wounds are all better especially on the left lateral lower leg. 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 6/24; blister on the right lateral lower leg has healed. There is no additional wounds. The traumatic left second toe is also closed. The 4 that he has are all better 7/1; all of the patient's wounds are smaller except for the proximal one on the left lateral calf. We are using silver collagen 7/22; patient has home health. Relatively refractory wounds on the lateral part of both mid calfs. Each of them 2 wounds. The areas on the left are doing much better in fact the distal 1 looks like it is on his way to closure. The right required debridement with adherent fibrinous debris under illumination. We have been using silver collagen. Previous arterial studies were within normal limits he also had venous reflux studies 04/03/2020 upon evaluation today patient appears to be doing excellent with regard to his lower extremity ulcers. He is going to require a little bit of debridement on the wounds on the right especially in order to clear away some biofilm and slough but this is minimal and overall he seems to be doing quite well. 8/19; the patient's wounds on the left lateral lower leg just above closed. However the 2  on the lateral part of the right have a nonviable necrotic surface. This is disappointing. There is also no change in dimensions. He has had arterial studies as well as venous reflux studies 8/26; very disappointing today. Even the more superficial areas on the left are about the same as last week. Still 2 punched-out areas completely unchanged on the right. We have been using silver collagen really making no progress. 9/2; changed to Iodoflex last week better looking wound surfaces especially on the right although they are deep and punched out. The areas on the left are more superficial open 1 of these is almost fully epithelialized although it has been this way for at least 2 weeks. He has a 20% co-pay [Medicare only] unaffordable for a skin  substitute. Had some thought about a snap VAC on the deep areas on the right leg we will try to put this through in the insurance 9/9; punched-out wounds on the bilateral lateral lower extremities. We have been operating as if the these were chronic venous wounds with secondary lymphedema. The areas on the left have been doing well in fact one of them is closed over. We have had no improvement in the areas on the right we have been using Iodoflex to help with surface debridement. The areas on the right are deeper wider. I do not see any evidence of infection. Home health is not been putting the wraps on high enough he has localized lymphedema right above the wounds. He has had arterial and venous studies already 9/16; punched-out mirror-image wounds on his bilateral lateral mid calfs. We have 1 closed on the left lateral the other smaller. For the first time today the areas on the right lateral actually looks some better. I did a biopsy of 1 of these last time although we still do not have this result. HOWEVER he arrives in clinic today complaining of chest pain overnight which felt like a brick on his chest. He also had 2 black watery bowel movements. He does not have nausea or vomiting. He is not describing abdominal pain. He has no prior history of diarrhea heartburn etc. Electronic Signature(s) Signed: 05/15/2020 4:33:07 PM By: Linton Ham MD Entered By: Linton Ham on 05/15/2020 08:58:23 -------------------------------------------------------------------------------- Physical Exam Details Patient Name: Date of Service: [place] 05/15/2020 8:00 [place] Record Number: [id number] Patient Account Number: [id number] Date of Birth/Sex: Treating RN: 1940-09-07 (79 y.o. [name], [name] Primary Care Provider: [name]., [name] Other Clinician: Referring Provider: Treating Provider/Extender: [name], [name] in Treatment:  31 Constitutional Sitting or standing Blood Pressure is within target range for patient.. Pulse regular and within target range for patient.Marland Kitchen Respirations regular, non-labored and within target range.. Temperature is normal and within the target range for the patient.Marland Kitchen Appears in no distress. Respiratory work of breathing is normal. Bilateral breath sounds are clear and equal in all lobes with no wheezes, rales or rhonchi.. Cardiovascular Heart rhythm and rate regular, without murmur or gallop. No S3. Gastrointestinal (GI) Slightly distended. Small umbilical hernia that reduces easily. No liver or spleen enlargement. Notes Wound exam; the area on the left has 1 in the 2 wounds healed but one distally. The one proximally looks better. Surprisingly both of the wounds on the right lateral leg look better today as well. I note we have much better edema control. Peripheral pulses are palpable Electronic Signature(s) Signed: 05/15/2020 4:33:07 PM By: Linton Ham MD Entered By: Dellia Nims,  Legrand Como on 05/15/2020 08:59:38 -------------------------------------------------------------------------------- Physician Orders Details Patient Name: Date of Service: [name] 05/15/2020 8:00 [place] Record Number: [id number] Patient Account Number: [id number] Date of Birth/Sex: Treating RN: 01/01/1941 (79 y.o. [name], [name] Primary Care Provider: [name]., [name] Other Clinician: Referring Provider: Treating Provider/Extender: [name], [name] in Treatment: 59 Verbal / Phone Orders: No Diagnosis Coding ICD-10 Coding Code Description [phone] Chronic venous hypertension (idiopathic) with inflammation of bilateral lower extremity I89.0 Lymphedema, not elsewhere classified L97.822 Non-pressure chronic ulcer of other part of left lower leg with fat layer exposed L97.812 Non-pressure chronic ulcer of other part of right lower leg with fat layer exposed S90.812D  Abrasion, left foot, subsequent encounter Follow-up Appointments Return Appointment in 1 week. Dressing Change Frequency Other: - all wounds - change twice a week by home health Skin Barriers/Peri-Wound Care Moisturizing lotion Wound Cleansing May shower with protection. - use cast protectors on the days dressings are not changed. May shower and wash wound with soap and water. - with dressing changes only. Primary Wound Dressing Wound #3 Right,Proximal,Lateral Lower Leg Silver Collagen - moisten with hydrogel. Wound #4 Right,Distal,Lateral Lower Leg Silver Collagen - moisten with hydrogel. Wound #6 Left,Proximal,Lateral Lower Leg Silver Collagen - moisten with hydrogel. Secondary Dressing Dry Gauze - all wounds ABD pad - all wounds Other: - pad left medial first met head for protection. Negative Presssure Wound Therapy Other: - wound center to run insurance approval and cost for negative pressure wound vac for possibility of placement to right leg wounds. Edema Control 4 layer compression - Bilateral - ***PLEASE ENSURE TO WRAP BOTH LEGS FROM JUST BELOW THE KNEE TO FOOT .*** Avoid standing for long periods of time Elevate legs to the level of the heart or above for 30 minutes daily and/or when sitting, a frequency of: - throughout the day. Additional Orders / Instructions Other: - Patient to go to the emergency department related to chest and abdominal issues. [place] skilled [facility] for wound care. [facility] Electronic Signature(s) Signed: 05/15/2020 4:33:07 PM By: Linton Ham MD Signed: 05/15/2020 4:55:33 PM By: Deon Pilling Entered By: Deon Pilling on 05/15/2020 08:26:14 -------------------------------------------------------------------------------- Problem List Details Patient Name: Date of Service: [place] 05/15/2020 8:00 [place] Record Number: [id number] Patient Account Number: [id number] Date of  Birth/Sex: Treating RN: 1941-02-11 (79 y.o. 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/06/2020 No Yes L97.812 Non-pressure chronic ulcer of other part of right lower leg with fat layer 01/03/2020 No Yes exposed S90.812D Abrasion, left foot, subsequent encounter 02/07/2020 No Yes I42.9 Cardiomyopathy, unspecified 05/15/2020 No Yes K92.1 [place] 05/15/2020 No Yes Inactive Problems ICD-10 Code Description Active Date Inactive Date L97.521 Non-pressure chronic ulcer of other part of left foot limited to breakdown of skin 10/11/2019 10/11/2019 Resolved Problems ICD-10 Code Description Active Date Resolved Date 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 10/11/2019 10/11/2019 Electronic Signature(s) Signed: 05/15/2020 4:33:07 PM By: Linton Ham MD Entered By: Linton Ham on 05/15/2020 08:56:07 --------------------------------------------------------------------------------  Progress Note Details Patient Name: Date of Service: [place] 05/15/2020 8:00 [place] Record Number: [id number] Patient Account Number: [id number] Date of Birth/Sex: Treating RN: [birth year] (79 y.o. [name], [name] Primary Care Provider: [name]., [name] Other Clinician: Referring Provider: Treating Provider/Extender: [name], [name] in Treatment: 31 Subjective History of Present Illness (HPI) ADMISSION 10/11/2019 This is a 79 year old man with a history of a  severe cardiomyopathy with an ejection fraction of about 20%, chronic renal failure stage III. He is listed as a type II diabetic in epic although the patient denies this. He also has a history of PVD. He states for the last month he has had wounds on his bilateral lower extremities that started off as blisters which denuded. He has areas on the left lateral calf and 2 on the right lateral. He has an area on the left first met head which he did not know was there he we identified this on intake. He has been using Silvadene cream provided by his primary care physician but he is complaining that this burns. 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 He was acutely ill at that time complicating GI bleed. ABIs in our clinic were 1.16 on the right and 1.13 on the left 2/25; the patient comes in with his areas on the left lateral and right lateral calf. There is also an area over the left first MTP bunion deformity. We have been using Sorbact. His edema control is fairly good 3/4; left lateral and right lateral calf. Most of his wounds are in the same position tightly adherent nonviable debris. On the right we debrided the superior wound on the left both wounds. The area on his bunion over the left first MTP medially is I think just about closed. We have been using Sorbact without a lot of success changed to Iodoflex under compression 3/11; left lateral and right lateral calf perhaps minor improvement in the surface condition. We have been using Iodoflex. The area over the bunion of the left first MTP has closed over 3/18; left lateral and right lateral  calf not much improvement. We have been using Iodoflex. Aggressive debridement last week. 3/25; left lateral and right lateral calf. We have been using Iodoflex. There is some improvement in the superior area on the right and 2 on the lateral left although there is still a lot of debris on the surface. The inferior area on the right is still a completely nonviable surface. 4/8; left lateral and right lateral calfs. Essentially mirror-image looking wounds 2 wounds on each side in close juxtaposition we have been using Iodoflex with some improvement in the very adherent fibrinous debris but not a lot. The patient has arterial studies next Wednesday morning and venous studies next Thursday morning. I have been avoiding any further aggressive debridement until we see the arterial study results. His ABIs were fairly good in this clinic and is dorsalis pedis pulses are palpable but the wound beds are pale. 4/15; left lateral and right lateral calfs. Essentially mirror-image wounds with 2 wounds on each side in close juxtaposition but with rims of separating  normal tissue. We have been using Iodoflex. The base of the wounds has been cleaning up quite nicely ARTERIAL STUDIES were done showing the patient had an ABI on the right of 1.27 with triphasic waveforms and a TBI of 0.90 on the left triphasic waveforms with an ABI of 1.28 and a TBI of 0.87. No evidence of arterial disease VENOUS REFLUX STUDIES; were done yesterday we do not have this report yet. 4/23; VENOUS REFLUX STUDIES did not show any significant reflux right lower extremity. No evidence of a DVT He did have significant reflux in the common . femoral vein on the left but nothing else was listed as significant. He did not have a DVT. We have been using Iodoflex under compression his wounds are making progress. 4/29; improvements in the wound surface continue. We have been using Iodoflex. He has a 20% out-of-pocket co-pay for Apligraf which is  unfortunate. We changed him to Sorbact today 5/6; started on Sorbact last week. Better looking wound surface but not much change in dimensions. 01/24/20-Patient is back at 3 weeks, wound surfaces are about the same but very minimal slough on the right leg wounds, however there is blistering on both legs adjacent to the wounds, patient denies any other symptoms or new symptoms. His studies have been reviewed and do not indicate any significant arterial or venous disease. But he is attending once in 3 weeks with home health changing in between 6/3; patient has 4 wounds 2 on each side of his lateral lower legs. These wounds are somewhat improved. He apparently arrived in clinic last week with a large blister on the right lateral lower leg that had denuded into the wound. They changed to silver alginate. Still under compression. He has straight Medicare and unfortunately has an unlimited co-pay for advanced treatment options. 6/10; his original 4 wounds are all better especially on the left lateral lower leg. 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 6/24; blister on the right lateral lower leg has healed. There is no additional wounds. The traumatic left second toe is also closed. The 4 that he has are all better 7/1; all of the patient's wounds are smaller except for the proximal one on the left lateral calf. We are using silver collagen 7/22; patient has home health. Relatively refractory wounds on the lateral part of both mid calfs. Each of them 2 wounds. The areas on the left are doing much better in fact the distal 1 looks like it is on his way to closure. The  right required debridement with adherent fibrinous debris under illumination. We have been using silver collagen. Previous arterial studies were within normal limits he also had venous reflux studies 04/03/2020 upon evaluation today patient appears to be doing excellent with regard to his lower extremity ulcers. He is going to require a little bit of debridement on the wounds on the right especially in order to clear away some biofilm and slough but this is minimal and overall he seems to be doing quite well. 8/19; the patient's wounds on the left lateral lower leg just above closed. However the 2 on the lateral part of  the right have a nonviable necrotic surface. This is disappointing. There is also no change in dimensions. He has had arterial studies as well as venous reflux studies 8/26; very disappointing today. Even the more superficial areas on the left are about the same as last week. Still 2 punched-out areas completely unchanged on the right. We have been using silver collagen really making no progress. 9/2; changed to Iodoflex last week better looking wound surfaces especially on the right although they are deep and punched out. The areas on the left are more superficial open 1 of these is almost fully epithelialized although it has been this way for at least 2 weeks. He has a 20% co-pay [Medicare only] unaffordable for a skin substitute. Had some thought about a snap VAC on the deep areas on the right leg we will try to put this through in the insurance 9/9; punched-out wounds on the bilateral lateral lower extremities. We have been operating as if the these were chronic venous wounds with secondary lymphedema. The areas on the left have been doing well in fact one of them is closed over. We have had no improvement in the areas on the right we have been using Iodoflex to help with surface debridement. The areas on the right are deeper wider. I do not see any evidence of infection. Home health  is not been putting the wraps on high enough he has localized lymphedema right above the wounds. He has had arterial and venous studies already 9/16; punched-out mirror-image wounds on his bilateral lateral mid calfs. We have 1 closed on the left lateral the other smaller. For the first time today the areas on the right lateral actually looks some better. I did a biopsy of 1 of these last time although we still do not have this result. HOWEVER he arrives in clinic today complaining of chest pain overnight which felt like a brick on his chest. He also had 2 black watery bowel movements. He does not have nausea or vomiting. He is not describing abdominal pain. He has no prior history of diarrhea heartburn etc. Objective Constitutional Sitting or standing Blood Pressure is within target range for patient.. Pulse regular and within target range for patient.Marland Kitchen Respirations regular, non-labored and within target range.. Temperature is normal and within the target range for the patient.Marland Kitchen Appears in no distress. Vitals Time Taken: 7:50 AM, Height: 71 in, Weight: 198 lbs, BMI: 27.6, Temperature: 97.7 F, Pulse: 97 bpm, Respiratory Rate: 19 breaths/min, Blood Pressure: 114/70 mmHg. Respiratory work of breathing is normal. Bilateral breath sounds are clear and equal in all lobes with no wheezes, rales or rhonchi.. Cardiovascular Heart rhythm and rate regular, without murmur or gallop. No S3. Gastrointestinal (GI) Slightly distended. Small umbilical hernia that reduces easily. No liver or spleen enlargement. General Notes: Wound exam; the area on the left has 1 in the 2 wounds healed but one distally. The one proximally looks better. Surprisingly both of the wounds on the right lateral leg look better today as well. I note we have much better edema control. Peripheral pulses are palpable Integumentary (Hair, Skin) Wound #3 status is Open. Original cause of wound was Blister. The wound is located on the  Right,Proximal,Lateral Lower Leg. The wound measures 1.5cm length x 1cm width x 0.5cm depth; 1.178cm^2 area and 0.589cm^3 volume. There is Fat Layer (Subcutaneous Tissue) exposed. There is no tunneling or undermining noted. There is a medium amount of serosanguineous drainage noted. The wound margin is well defined and  not attached to the wound base. There is large (67-100%) red, pink granulation within the wound bed. There is a small (1-33%) amount of necrotic tissue within the wound bed including Adherent Slough. Wound #4 status is Open. Original cause of wound was Blister. The wound is located on the Right,Distal,Lateral Lower Leg. The wound measures 2.2cm length x 1.8cm width x 1.5cm depth; 3.11cm^2 area and 4.665cm^3 volume. There is Fat Layer (Subcutaneous Tissue) exposed. There is no tunneling or undermining noted. There is a medium amount of serosanguineous drainage noted. The wound margin is well defined and not attached to the wound base. There is medium (34-66%) red, pink granulation within the wound bed. There is a medium (34-66%) amount of necrotic tissue within the wound bed including Adherent Slough. Wound #6 status is Open. Original cause of wound was Gradually Appeared. The wound is located on the Left,Proximal,Lateral Lower Leg. The wound measures 1cm length x 0.5cm width x 0.2cm depth; 0.393cm^2 area and 0.079cm^3 volume. There is Fat Layer (Subcutaneous Tissue) exposed. There is no tunneling or undermining noted. There is a small amount of serosanguineous drainage noted. The wound margin is well defined and not attached to the wound base. There is large (67-100%) pink granulation within the wound bed. There is a small (1-33%) amount of necrotic tissue within the wound bed including Adherent Slough. Assessment Active Problems ICD-10 Chronic venous hypertension (idiopathic) with inflammation of bilateral lower extremity Lymphedema, not elsewhere classified Non-pressure chronic  ulcer of other part of left lower leg with fat layer exposed Non-pressure chronic ulcer of other part of right lower leg with fat layer exposed Abrasion, left foot, subsequent encounter Cardiomyopathy, unspecified [place] Procedures Wound #3 Pre-procedure diagnosis of Wound #3 is a Venous Leg Ulcer located on the Right,Proximal,Lateral Lower Leg . There was a Four Layer Compression Therapy Procedure with a pre-treatment ABI of 1.1 by Deon Pilling, RN. Post procedure Diagnosis Wound #3: Same as Pre-Procedure Wound #4 Pre-procedure diagnosis of Wound #4 is a Venous Leg Ulcer located on the Right,Distal,Lateral Lower Leg . There was a Four Layer Compression Therapy Procedure with a pre-treatment ABI of 1.1 by Deon Pilling, RN. Post procedure Diagnosis Wound #4: Same as Pre-Procedure Wound #6 Pre-procedure diagnosis of Wound #6 is a Venous Leg Ulcer located on the Left,Proximal,Lateral Lower Leg . There was a Four Layer Compression Therapy Procedure with a pre-treatment ABI of 1.1 by Deon Pilling, RN. Post procedure Diagnosis Wound #6: Same as Pre-Procedure Plan Follow-up Appointments: Return Appointment in 1 week. Dressing Change Frequency: Other: - all wounds - change twice a week by home health Skin Barriers/Peri-Wound Care: Moisturizing lotion Wound Cleansing: May shower with protection. - use cast protectors on the days dressings are not changed. May shower and wash wound with soap and water. - with dressing changes only. Primary Wound Dressing: Wound #3 Right,Proximal,Lateral Lower Leg: Silver Collagen - moisten with hydrogel. Wound #4 Right,Distal,Lateral Lower Leg: Silver Collagen - moisten with hydrogel. Wound #6 Left,Proximal,Lateral Lower Leg: Silver Collagen - moisten with hydrogel. Secondary Dressing: Dry Gauze - all wounds ABD pad - all wounds Other: - pad left medial first met head for protection. Negative Presssure Wound Therapy: Other: - wound center to run  insurance approval and cost for negative pressure wound vac for possibility of placement to right leg wounds. Edema Control: 4 layer compression - Bilateral - ***PLEASE ENSURE TO WRAP BOTH LEGS FROM JUST BELOW THE KNEE TO FOOT .*** Avoid standing for long periods of time Elevate legs to the  level of the heart or above for 30 minutes daily and/or when sitting, a frequency of: - throughout the day. Additional Orders / Instructions: Other: - Patient to go to the emergency department related to chest and abdominal issues. Home Health: [facility]. - [facility] 1. From a wound care point of view I am pleased today. We will continue with silver collagen to the wounds. His edema control is really very good and I wonder if this is made all the difference. The left leg wrap seemed to fall down but the right did not. 2. Unfortunately I found his presentation and other complaints today unsettling. He complained of chest pain fairly constantly like a brick on his chest this went on starting late last evening. He is always this go describe to black watery stools. The patient has a history of a cardiomyopathy with a last ejection fraction in October 2020 at less than 20%. I reviewed this in [place] link. He also has chronic renal failure but I do not see any recent lab work in him. 3. His vital signs were stable and he did not look unwell however I was concerned enough about potential instability in this man with a severe cardiomyopathy, severe chronic renal failure to send him to the ER for evaluation of chest pain and potential upper GI bleeding. He is not the type of person that could tolerate much deterioration. I felt we needed to be certain about what is happening here. I do not have the ability to guaiac stool or do a rectal exam in this clinic. I discussed this with the patient and he agreed we transported him to [facility] ER Electronic  Signature(s) Signed: 05/15/2020 4:33:07 PM By: Linton Ham MD Entered By: Linton Ham on 05/15/2020 09:02:14 -------------------------------------------------------------------------------- SuperBill Details Patient Name: Date of Service: [place] 05/15/2020 Medical Record Number: [id number] Patient Account Number: [id number] Date of Birth/Sex: Treating RN: [birth year] (79 y.o. [name], [name] Primary Care Provider: [name]., [name] Other Clinician: Referring Provider: Treating Provider/Extender: [name], [name] in Treatment: 31 Diagnosis Coding ICD-10 Codes Code Description [phone] Chronic venous hypertension (idiopathic) with inflammation of bilateral lower extremity I89.0 Lymphedema, not elsewhere classified L97.822 Non-pressure chronic ulcer of other part of left lower leg with fat layer exposed L97.812 Non-pressure chronic ulcer of other part of right lower leg with fat layer exposed S90.812D Abrasion, left foot, subsequent encounter I42.9 Cardiomyopathy, unspecified K92.1 [facility] Facility Procedures The patient participates with Medicare or their insurance follows the Medicare Facility Guidelines: CPT4 Description Modifier Quantity Code [id number] BILATERAL: Application of multi-layer venous compression system; leg (below knee), including ankle and 1 foot. Physician Procedures : CPT4 Code Description Modifier [id number] - WC PHYS LEVEL 4 - EST PT ICD-10 Diagnosis Description I87.323 Chronic venous hypertension (idiopathic) with inflammation of bilateral lower extremity L97.812 Non-pressure chronic ulcer of other part of  right lower leg with fat layer exposed K92.1 [place] I42.9 Cardiomyopathy, unspecified Quantity: 1 Electronic Signature(s) Signed: 05/15/2020 4:33:07 PM By: Linton Ham MD Entered By: Linton Ham on 05/15/2020 09:02:56

## 2020-05-15 NOTE — Consult Note (Signed)
I have placed a request via Secure Chat to Dr. Neysa Bonito   requesting photos of the wound areas of concern to be placed in the EMR.    [name] [facility], CNS, CWON-AP [phone]  Noted: patient is followed by the [facility]. Will review POC in place for the LEs to determine needs while inpatient.

## 2020-05-15 NOTE — H&P (Signed)
History and Physical        Hospital Admission Note Date: 05/15/2020  Patient name: [name] Medical record number: [id number] Date of birth: Mar 01, 1941 Age: 79 y.o. Gender: male  PCP: [name], MD  Patient coming from: home Lives with: alone, son visiting from out of state At baseline, ambulates: with a cane  Chief Complaint    Chief Complaint  Patient presents with  . Chest Pain  . abdominal distention  . 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  Has had intermittent shortness of breath, fatigue.  Denies any abdominal pain, fevers, nausea, vomiting.  States his stool has been black with some minimal bright red blood.  He states that this a.m. he took 2 aspirin to his chest discomfort which he described as a brick on his chest.  He has not been taking his Coumadin and does not know when his last dose was.  Has been taking his other medications. Of note, patient had an EGD this past October which showed erythematous mucosa in the gastric body and antrum as well as nonbleeding gastric ulcers with clean ulcer base.  ED Course: Hemodynamically stable on room air.  Notable labs: BUN 96 (baseline 40), creatinine 2.11 (at baseline), BNP 2167 (chronically elevated), troponin 37, Hb 5.8, platelets 195, FOBT positive, INR 1.1.  EKG appears at baseline with PACs.  CXR unremarkable.  2 units PRBCs ordered by ED physician and Protonix drip  started.  ED consulted Dr. Benson Norway, GI, who will see the patient.  Vitals:   05/15/20 0904 05/15/20 1130  BP: (!) 129/55 117/66  Pulse: 92 83  Resp: 20 16  Temp: 97.6 F (36.4 C)   SpO2: 99% 100%     Review of Systems:  Review of Systems  Constitutional: Positive for malaise/fatigue. Negative for chills and fever.  Eyes: Negative.   Respiratory: Positive for shortness of breath. Negative for cough.   Cardiovascular: Positive for chest pain. Negative for palpitations.  Gastrointestinal: Positive for blood in stool. Negative for nausea and vomiting.  Musculoskeletal: Negative for myalgias.  Neurological: Negative.   All other systems reviewed and are negative.   Medical/Social/Family History   Past Medical History: Past Medical History:  Diagnosis Date  . Acute cholecystitis 11/23/2017  . Acute respiratory failure with hypoxia ([place])   . Acute upper back pain   . AKI (acute kidney injury) ([place])   . Alcohol abuse 02/26/2015  . Anemia, deficiency   . Back pain    LUMBOSACRAL  . Bronchitis, complicated   . Cardiomegaly   . Chronic combined systolic and diastolic CHF (congestive heart failure) ([place])   . Chronic pain    HIGH RISK MED USE COMPREHENSIVE HIGH RISK  . CKD (chronic kidney disease), stage III   .  Demand ischemia ([place])    a. minimally elevated troponin peak 0.11 in 2016, felt demand ischemia. Cath offered but pt wished to have done back in [place].  . Diabetes mellitus without complication ([place])   . Edema, leg   . Elevated troponin 02/26/2015  . Essential hypertension   . Fatigue   . Heart abnormality   . Hyperlipidemia   . Hypertension   . Neck pain, acute   . PVD (peripheral vascular disease) ([place])   . Renal lesion 11/23/2017  . SOB (shortness of breath) on exertion   . Solitary kidney   . Vitamin D deficiency     Past Surgical History:  Procedure Laterality Date  . ENDOSCOPIC RETROGRADE CHOLANGIOPANCREATOGRAPHY (ERCP) WITH PROPOFOL N/A 06/19/2019   Procedure:  ENDOSCOPIC RETROGRADE CHOLANGIOPANCREATOGRAPHY (ERCP) WITH PROPOFOL;  Surgeon: Milus Banister, MD;  Location[facility] END[facility]COPY;  Service: Endoscopy;  Laterality: N/A;  . ESOPHAGOGASTRODUODENOSCOPY (EGD) WITH PROPOFOL N/A 07/01/2019   Procedure: ESOPHAGOGASTRODUODENOSCOPY (EGD) WITH PROPOFOL;  Surgeon: Rush Landmark Telford Nab., MD;  Location: [facility];  Service: Gastroenterology;  Laterality: N/A;  . IR CHOLANGIOGRAM EXISTING TUBE  12/27/2017  . IR PERC CHOLECYSTOSTOMY  11/25/2017  . IR RADIOLOGIST EVAL & MGMT  02/02/2018  . REMOVAL OF STONES  06/19/2019   Procedure: REMOVAL OF STONES;  Surgeon: Milus Banister, MD;  Location: [facility] ENDOSCOPY;  Service: Endoscopy;;  . Joan Mayans  06/19/2019   Procedure: Joan Mayans;  Surgeon: Milus Banister, MD;  Location: [facility] ENDOSCOPY;  Service: Endoscopy;;    Medications: Prior to Admission medications   Medication Sig Start Date End Date Taking? Authorizing Provider  aspirin EC 81 MG tablet Take 81 mg by mouth daily. Swallow whole.   Yes [provider]  carvedilol (COREG) 3.125 MG tablet Take 1 tablet (3.125 mg total) by mouth 2 (two) times daily. 07/08/19 07/07/20 Yes Elie Confer, MD  pantoprazole (PROTONIX) 40 MG tablet Take 1 tablet (40 mg total) by mouth 2 (two) times daily before a meal. 07/09/19  Yes [place], Waneta Martins, MD  torsemide (DEMADEX) 20 MG tablet Take 2 tablets (40 mg total) by mouth daily. 07/09/19 05/15/20 Yes Elie Confer, MD  alum & mag hydroxide-simeth (MAALOX/MYLANTA) 200-200-20 MG/5ML suspension Take 15 mLs by mouth every 4 (four) hours as needed for indigestion or heartburn. Patient not taking: Reported on 05/15/2020 07/08/19   Elie Confer, MD  feeding supplement, ENSURE ENLIVE, (ENSURE ENLIVE) LIQD Take 237 mLs by mouth 2 (two) times daily between meals. Patient not taking: Reported on 05/15/2020 07/09/19   Elie Confer, MD  polyethylene glycol (MIRALAX / GLYCOLAX) 17 g packet Take 17 g by mouth daily as  needed for moderate constipation. Patient not taking: Reported on 05/15/2020 07/08/19   Elie Confer, MD  simethicone (MYLICON) 80 MG chewable tablet Chew 2 tablets (160 mg total) by mouth 4 (four) times daily -  before meals and at bedtime. Patient not taking: Reported on 05/15/2020 07/08/19   Elie Confer, MD  tamsulosin (FLOMAX) 0.4 MG CAPS capsule Take 1 capsule (0.4 mg total) by mouth daily. Patient not taking: Reported on 05/15/2020 07/08/19   Elie Confer, MD  warfarin (COUMADIN) 7.5 MG tablet Take 1 tablet (7.5 mg total) by mouth one time only at 6 PM. Patient not taking: Reported on 05/15/2020 07/08/19 08/07/19  Elie Confer, MD    Allergies:  No Known Allergies  Social History:  reports that he quit smoking about 12 years ago. His smoking use included cigarettes.  He has never used smokeless tobacco. He reports current alcohol use. He reports that he does not use drugs.  Family History: Family History  Problem Relation Age of Onset  . Other Mother        died in her sleep at 22  . Hypertension Sister   . Hyperlipidemia Sister   . Diabetes Mellitus II Sister   . Heart disease Sister   . CAD Neg Hx        neg hx premature CAD     Objective   Physical Exam: Blood pressure 117/66, pulse 83, temperature 97.6 F (36.4 C), temperature source Oral, resp. rate 16, height 5\' 11"  (1.803 m), weight 88.9 kg, SpO2 100 %.  Physical Exam Vitals and nursing note reviewed.  Constitutional:      Appearance: Normal appearance.  HENT:     Head: Normocephalic and atraumatic.  Eyes:     Conjunctiva/sclera: Conjunctivae normal.  Cardiovascular:     Rate and Rhythm: Normal rate and regular rhythm.  Pulmonary:     Effort: Pulmonary effort is normal.     Breath sounds: Normal breath sounds.  Abdominal:     General: Abdomen is flat.     Palpations: Abdomen is soft.     Comments: Non tender  Musculoskeletal:        General: No swelling or tenderness.     Comments:  Bilateral lower extremities with wrap from wound clinic  Skin:    Coloration: Skin is not jaundiced or pale.  Neurological:     Mental Status: He is alert. Mental status is at baseline.  Psychiatric:        Mood and Affect: Mood normal.        Behavior: Behavior normal.     LABS on Admission: I have personally reviewed all the labs and imaging below    Basic Metabolic Panel: Recent Labs  Lab 05/15/20 1020  NA 136  K 5.0  CL 106  CO2 22  GLUCOSE 105*  BUN 96*  CREATININE 2.11*  CALCIUM 8.4*   Liver Function Tests: Recent Labs  Lab 05/15/20 1020  AST 14*  ALT 9  ALKPHOS 59  BILITOT 1.0  PROT 7.4  ALBUMIN 2.9*   Recent Labs  Lab 05/15/20 1020  LIPASE 26   No results for input(s): AMMONIA in the last 168 hours. CBC: Recent Labs  Lab 05/15/20 1020  WBC 5.5  NEUTROABS 4.7  HGB 5.8*  HCT 18.6*  MCV 95.4  PLT 195   Cardiac Enzymes: No results for input(s): CKTOTAL, CKMB, CKMBINDEX, TROPONINI in the last 168 hours. BNP: Invalid input(s): POCBNP CBG: No results for input(s): GLUCAP in the last 168 hours.  Radiological Exams on Admission:  DG Chest 2 View  Result Date: 05/15/2020 CLINICAL DATA:  Chest pain. EXAM: CHEST - 2 VIEW COMPARISON:  June 18, 2019. FINDINGS: Mild cardiomegaly is noted. No pneumothorax or pleural effusion is noted. Both lungs are clear. The visualized skeletal structures are unremarkable. IMPRESSION: No active cardiopulmonary disease. Electronically Signed   By: Marijo Conception M.D.   On: 05/15/2020 09:45      EKG: Independently reviewed.    A & P   Principal Problem:   GI bleed with acute blood loss anemia, suspected upper Active Problems:   Hyperlipidemia   Essential hypertension   Anemia of chronic disease   CKD (chronic kidney disease), stage IV (HCC)   Chest pain   1. Symptomatic acute blood loss anemia secondary to GI bleed  Acute on chronic anemia a. Hb 5.8, baseline 7-8.  Hemodynamically stable on room  air b. 2 unit PRBCs ordered by ED -we will give Lasix with blood transfusion to prevent volume overload c. GI consulted d. Continue Protonix drip e. Hold Aspirin and coumadin f. N.p.o. pending GI recommendations g. Goal Hb >7.0  2. Chest discomfort, secondary to anemia a. Troponin minimally elevated at 37 b. Repeat troponin c. Telemetry  3. Systolic heart failure, not in exacerbation a. Holding p.o. meds for now -on Coreg and torsemide at home b. Lopressor IV twice daily while n.p.o. then switch to orals when able c. Monitor daily weights and intake/output  4. CKD 4, at baseline  5. Chronic bilateral lower extremity wounds/lymphedema a. WOCN consulted  6. Hypertension a. Lopressor twice daily for now then restart home meds when able  7. Hyperlipidemia a. Restart home meds tolerating p.o. intake  8. History of LV thrombus (Dx in October 2020) a. Previously on Coumadin, currently not taking b. INR 1.1 c. Hold coumadin   DVT prophylaxis: none   Code Status: Prior  Diet: NPO Family Communication: Admission, patients condition and plan of care including tests being ordered have been discussed with the patient who indicates understanding and agrees with the plan and Code Status. Patient's sister was updated  Disposition Plan: The appropriate patient status for this patient is INPATIENT. Inpatient status is judged to be reasonable and necessary in order to provide the required intensity of service to ensure the patient's safety. The patient's presenting symptoms, physical exam findings, and initial radiographic and laboratory data in the context of their chronic comorbidities is felt to place them at high risk for further clinical deterioration. Furthermore, it is not anticipated that the patient will be medically stable for discharge from the hospital within 2 midnights of admission. The following factors support the patient status of inpatient.   " The patient's presenting  symptoms include chest pain, shortness of breath, black stools. " Physical exam overall unremarkable " The initial radiographic and laboratory data are worrisome because of Hb 5.8 " The chronic co-morbidities include systolic heart failure, chronic anemia, CKD 4.   * I certify that at the point of admission it is my clinical judgment that the patient will require inpatient hospital care spanning beyond 2 midnights from the point of admission due to high intensity of service, high risk for further deterioration and high frequency of surveillance required.*   Status is: Inpatient  Remains inpatient appropriate because:Inpatient level of care appropriate due to severity of illness   Dispo: The patient is from: Home              Anticipated d/c is to: Home              Anticipated d/c date is: 3 days              Patient currently is not medically stable to d/c.     Consultants  . GI  Procedures  . None  Time Spent on Admission: 76 minutes    Harold Hedge, DO [facility] Pager [phone] 05/15/2020, 12:59 PM

## 2020-05-15 NOTE — Consult Note (Addendum)
WOC consulted for LE Lymphedema and chronic wounds.   Reviewed notes from the wound care center.  Last seen 05/08/20  Wound #3 status is Open. Original cause of wound was Blister. The wound is located on the Right,Proximal,Lateral Lower Leg. The wound measures 2cm length x 1.8cm width x 0.5cm depth There is a medium amount of serosanguineous drainage noted. The wound margin is well defined and not attached to the wound base. There is large (67- 100%) red, pink granulation within the wound bed. There is a small (1-33%) amount of necrotic tissue within the wound bed including Adherent Slough.  Wound #4 status is Open. Original cause of wound was Blister. The wound is located on the Right,Distal,Lateral Lower Leg. The wound measures 2.7cm length x 1.7cm width x 0.8cm depth. There is a medium amount of serosanguineous drainage noted. The wound margin is well defined and not attached to the wound base. There is large (67-100%) red, pink granulation within the wound bed. There is a small (1-33%) amount of necrotic tissue within the wound bed including Adherent Slough.  Wound #6 status is Open. Original cause of wound was Gradually Appeared. The wound is located on the Left,Proximal,Lateral Lower Leg. The wound measures 1.5cm length x 1cm width x 0.4cm depth.noted, however, there is undermining starting at 10:00 and ending at 5:00 with a maximum distance of 0.6cm. There is a small amount of serosanguineous drainage noted. The wound margin is well defined and not attached to the wound base. There is large (67-100%) red, pink granulation within the wound bed. There is no necrotic tissue within the wound bed. Return Appointment in 1 week. Dressing Change Frequency: Other: - all wounds - change twiceElevate legs to the level of the heart or above for 30 minutes daily and/or when sitting, a frequency of: - throughout the day.   Patient will need dressings and 4 layer compression wraps changed however while in the  ED this will be challenging to obtain supplies.  I will order supplies when the patient is admitted to the floor and plan to change at that time.   Will follow along [place], [place], [place]

## 2020-05-15 NOTE — H&P (View-Only) (Signed)
Reason for Consult: Melena and chest pain Referring Physician: [facility]  [facility] [place] HPI: This is a 79 year old male with a PMH of CHF (EF <20%), history of an LA Grade C esophagitis, superficial gastric ulcers, HTN, PVD, CKD and multiple other medical problems admitted today for complaints of chest pressure and black stools.  Starting yesterday he reports having some black loose stools.  This was associated with chest pressure and he stated that it felt as if a "brick" was on his chest.  On 07/01/2019 he presented with complaints of melena and IDA.  An EGD performed by Dr. Rush Landmark was positive for an LA Grade C esophagitis and gastric ulcers.  He was supposed to be on PPIs routinely, but he does not report using any PPIs of late.  His admission HGB was at 5.8 g/dL and it was previously at 7.8 g/dL on 07/08/2019.  His BNP today was at 2,167 with an elevated troponin at 37.  Past Medical History:  Diagnosis Date  . Acute cholecystitis 11/23/2017  . Acute respiratory failure with hypoxia ([place])   . Acute upper back pain   . AKI (acute kidney injury) ([place])   . Alcohol abuse 02/26/2015  . Anemia, deficiency   . Back pain    LUMBOSACRAL  . Bronchitis, complicated   . Cardiomegaly   . Chronic combined systolic and diastolic CHF (congestive heart failure) ([place])   . Chronic pain    HIGH RISK MED USE COMPREHENSIVE HIGH RISK  . CKD (chronic kidney disease), stage III   . Demand ischemia ([place])    a. minimally elevated troponin peak 0.11 in 2016, felt demand ischemia. Cath offered but pt wished to have done back in [place].  . Diabetes mellitus without complication ([place])   . Edema, leg   . Elevated troponin 02/26/2015  . Essential hypertension   . Fatigue   . Heart abnormality   . Hyperlipidemia   . Hypertension   . Neck pain, acute   . PVD (peripheral vascular disease) ([place])   . Renal lesion 11/23/2017  . SOB (shortness of breath) on exertion   . Solitary kidney   . Vitamin D  deficiency     Past Surgical History:  Procedure Laterality Date  . ENDOSCOPIC RETROGRADE CHOLANGIOPANCREATOGRAPHY (ERCP) WITH PROPOFOL N/A 06/19/2019   Procedure: ENDOSCOPIC RETROGRADE CHOLANGIOPANCREATOGRAPHY (ERCP) WITH PROPOFOL;  Surgeon: Milus Banister, MD;  Location: [facility] ENDOSCOPY;  Service: Endoscopy;  Laterality: N/A;  . ESOPHAGOGASTRODUODENOSCOPY (EGD) WITH PROPOFOL N/A 07/01/2019   Procedure: ESOPHAGOGASTRODUODENOSCOPY (EGD) WITH PROPOFOL;  Surgeon: Rush Landmark Telford Nab., MD;  Location: [facility];  Service: Gastroenterology;  Laterality: N/A;  . IR CHOLANGIOGRAM EXISTING TUBE  12/27/2017  . IR PERC CHOLECYSTOSTOMY  11/25/2017  . IR RADIOLOGIST EVAL & MGMT  02/02/2018  . REMOVAL OF STONES  06/19/2019   Procedure: REMOVAL OF STONES;  Surgeon: Milus Banister, MD;  Location: [facility] ENDOSCOPY;  Service: Endoscopy;;  . Joan Mayans  06/19/2019   Procedure: Joan Mayans;  Surgeon: Milus Banister, MD;  Location: [facility] ENDOSCOPY;  Service: Endoscopy;;    Family History  Problem Relation Age of Onset  . Other Mother        died in her sleep at 43  . Hypertension Sister   . Hyperlipidemia Sister   . Diabetes Mellitus II Sister   . Heart disease Sister   . CAD Neg Hx        neg hx premature CAD    Social History:  reports that he quit  smoking about 12 years ago. His smoking use included cigarettes. He has never used smokeless tobacco. He reports current alcohol use. He reports that he does not use drugs.  Allergies: No Known Allergies  Medications:  Scheduled: . furosemide  40 mg Intravenous Once  . metoprolol tartrate  2.5 mg Intravenous Q12H  . sodium chloride flush  3 mL Intravenous Q12H   Continuous: . pantoprozole (PROTONIX) infusion 8 mg/hr (05/15/20 1136)    Results for orders placed or performed during the hospital encounter of 05/15/20 (from the past 24 hour(s))  SARS Coronavirus 2 by RT PCR (hospital order, performed in [place] hospital lab) Nasopharyngeal  Nasopharyngeal Swab     Status: None   Collection Time: 05/15/20  9:44 AM   Specimen: Nasopharyngeal Swab  Result Value Ref Range   SARS Coronavirus 2 NEGATIVE NEGATIVE  Troponin I (High Sensitivity)     Status: Abnormal   Collection Time: 05/15/20 10:20 AM  Result Value Ref Range   Troponin I (High Sensitivity) 37 (H) <18 ng/L  Comprehensive metabolic panel     Status: Abnormal   Collection Time: 05/15/20 10:20 AM  Result Value Ref Range   Sodium 136 135 - 145 mmol/L   Potassium 5.0 3.5 - 5.1 mmol/L   Chloride 106 98 - 111 mmol/L   CO2 22 22 - 32 mmol/L   Glucose, Bld 105 (H) 70 - 99 mg/dL   BUN 96 (H) 8 - 23 mg/dL   Creatinine, Ser 2.11 (H) 0.61 - 1.24 mg/dL   Calcium 8.4 (L) 8.9 - 10.3 mg/dL   Total Protein 7.4 6.5 - 8.1 g/dL   Albumin 2.9 (L) 3.5 - 5.0 g/dL   AST 14 (L) 15 - 41 U/L   ALT 9 0 - 44 U/L   Alkaline Phosphatase 59 38 - 126 U/L   Total Bilirubin 1.0 0.3 - 1.2 mg/dL   GFR calc non Af Amer 30 (L) >60 mL/min   GFR calc Af Amer 34 (L) >60 mL/min   Anion gap 8 5 - 15  Type and screen [place]     Status: None (Preliminary result)   Collection Time: 05/15/20 10:20 AM  Result Value Ref Range   ABO/RH(D) A POS    Antibody Screen NEG    Sample Expiration 05/18/2020,2359    Unit Number [id number]    Blood Component Type RED CELLS,LR    Unit division 00    Status of Unit ISSUED    Transfusion Status OK TO TRANSFUSE    Crossmatch Result      Compatible Performed at [facility], [place] [address] [place], [place] [address]    Unit Number [id number]    Blood Component Type RED CELLS,LR    Unit division 00    Status of Unit ALLOCATED    Transfusion Status OK TO TRANSFUSE    Crossmatch Result Compatible   CBC with Differential     Status: Abnormal   Collection Time: 05/15/20 10:20 AM  Result Value Ref Range   WBC 5.5 4.0 - 10.5 K/uL   RBC 1.95 (L) 4.22 - 5.81 MIL/uL   Hemoglobin 5.8 (LL) 13.0 - 17.0 g/dL   HCT 18.6  (L) 39 - 52 %   MCV 95.4 80.0 - 100.0 fL   MCH 29.7 26.0 - 34.0 pg   MCHC 31.2 30.0 - 36.0 g/dL   RDW 15.6 (H) 11.5 - 15.5 %   Platelets 195 150 - 400 K/uL   nRBC  0.0 0.0 - 0.2 %   Neutrophils Relative % 84 %   Neutro Abs 4.7 1.7 - 7.7 K/uL   Lymphocytes Relative 9 %   Lymphs Abs 0.5 (L) 0.7 - 4.0 K/uL   Monocytes Relative 5 %   Monocytes Absolute 0.3 0 - 1 K/uL   Eosinophils Relative 1 %   Eosinophils Absolute 0.0 0 - 0 K/uL   Basophils Relative 0 %   Basophils Absolute 0.0 0 - 0 K/uL   Immature Granulocytes 1 %   Abs Immature Granulocytes 0.03 0.00 - 0.07 K/uL  Protime-INR     Status: None   Collection Time: 05/15/20 10:20 AM  Result Value Ref Range   Prothrombin Time 14.0 11.4 - 15.2 seconds   INR 1.1 0.8 - 1.2  Lipase, blood     Status: None   Collection Time: 05/15/20 10:20 AM  Result Value Ref Range   Lipase 26 11 - 51 U/L  Brain natriuretic peptide     Status: Abnormal   Collection Time: 05/15/20 10:20 AM  Result Value Ref Range   B Natriuretic Peptide 2,167.3 (H) 0.0 - 100.0 pg/mL  POC occult blood, ED     Status: Abnormal   Collection Time: 05/15/20 10:43 AM  Result Value Ref Range   Fecal Occult Bld POSITIVE (A) NEGATIVE  Prepare RBC (crossmatch)     Status: None   Collection Time: 05/15/20 11:02 AM  Result Value Ref Range   Order Confirmation      ORDER PROCESSED BY BLOOD BANK Performed at [facility], [place] [address], [place], [place] [address]      DG Chest 2 View  Result Date: 05/15/2020 CLINICAL DATA:  Chest pain. EXAM: CHEST - 2 VIEW COMPARISON:  June 18, 2019. FINDINGS: Mild cardiomegaly is noted. No pneumothorax or pleural effusion is noted. Both lungs are clear. The visualized skeletal structures are unremarkable. IMPRESSION: No active cardiopulmonary disease. Electronically Signed   By: Marijo Conception M.D.   On: 05/15/2020 09:45    ROS:  As stated above in the HPI otherwise negative.  Blood pressure 122/67, pulse 84,  temperature (!) 97.5 F (36.4 C), temperature source Oral, resp. rate 17, height 5\' 11"  (1.803 m), weight 88.9 kg, SpO2 100 %.    PE: Gen: NAD, Alert and Oriented HEENT:  [place]/AT, EOMI Neck: Supple, no LAD Lungs: CTA Bilaterally CV: RRR without M/G/R ABD: Soft, NTND, +BS Ext: No C/C/E  Assessment/Plan: 1) GI bleed. 2) Symptomatic anemia. 3) HFrHF. 4) History of esophagitis and PUD.   With the recurrent anemia and melenic stools, a repeat EGD will be performed.  It is likely that he has a recurrent source of bleeding in his upper GI tract.  Plan: 1) EGD tomorrow. 2) Agree with blood transfusion.  Hurbert Duran D 05/15/2020, 2:08 PM

## 2020-05-15 NOTE — Progress Notes (Signed)
[name], [name] ([id number]) Visit Report for 05/15/2020 Arrival Information Details Patient Name: Date of Service: [name], [name] 05/15/2020 8:00 [name] Record Number: [id number] Patient Account Number: [id number] Date of Birth/Sex: Treating Patton: 12-02-1940 (79 y.o. [name] Primary Care [name]: Troy Pear., [name] Other Clinician: Referring [name]: Treating [name]/Extender: Troy Leitz., [name] Weeks in Treatment: 7 Visit Information History Since Last Visit Added or deleted any medications: No Patient Arrived: Cane Any Troy allergies or adverse reactions: No Arrival Time: 07:42 Had Troy fall or experienced change in No Accompanied By: self activities of daily living that may affect Transfer Assistance: None risk of falls: Patient Identification Verified: Yes Signs or symptoms of abuse/neglect since last visito No Secondary Verification Process Completed: Yes Hospitalized since last visit: No Patient Requires Transmission-Based Precautions: No Implantable device outside of the clinic excluding No Patient Has Alerts: Yes cellular tissue based products placed in the center Patient Alerts: Patient on Blood Thinner since last visit: Has Dressing in Place as Prescribed: Yes Has Compression in Place as Prescribed: Yes Pain Present Now: No Electronic Signature(s) Signed: 05/15/2020 4:34:39 PM By: [name] Entered By: [name] on 05/15/2020 07:43:10 -------------------------------------------------------------------------------- Compression Therapy Details Patient Name: Date of Service: Troy [name] 05/15/2020 8:00 [name] Record Number: [id number] Patient Account Number: [id number] Date of Birth/Sex: Treating Patton: 27-Jul-1941 (79 y.o. [name] Primary Care Keshanna Riso: Troy Pear., [name] Other Clinician: Referring [name]: Treating [name]/Extender: Troy Leitz., [name] Weeks in  Treatment: 31 Compression Therapy Performed for Wound Assessment: Wound #3 Right,Proximal,Lateral Lower Leg Performed By: Clinician [name] Compression Type: Four Layer Pre Treatment ABI: 1.1 Post Procedure Diagnosis Same as Pre-procedure Electronic Signature(s) Signed: 05/15/2020 4:55:33 PM By: Troy Pilling Entered By: Troy Pilling on 05/15/2020 08:16:54 -------------------------------------------------------------------------------- Compression Therapy Details Patient Name: Date of Service: Troy [place] [name] 05/15/2020 8:00 [name] Medical Record Number: [id number] Patient Account Number: [id number] Date of Birth/Sex: Treating Patton: [birth year] (79 y.o. [name] Primary Care Roy Tokarz: Troy Pear., [name] Other Clinician: Referring Makalya Nave: Treating [name]/Extender: Troy Leitz., [name] Weeks in Treatment: 31 Compression Therapy Performed for Wound Assessment: Wound #6 Left,Proximal,Lateral Lower Leg Performed By: Clinician [name] Compression Type: Four Layer Pre Treatment ABI: 1.1 Post Procedure Diagnosis Same as Pre-procedure Electronic Signature(s) Signed: 05/15/2020 4:55:33 PM By: Troy Pilling Entered By: Troy Pilling on 05/15/2020 08:16:54 -------------------------------------------------------------------------------- Compression Therapy Details Patient Name: Date of Service: Troy [name] 05/15/2020 8:00 [name] Medical Record Number: [id number] Patient Account Number: [id number] Date of Birth/Sex: Treating Patton: 1941-04-07 (79 y.o. [name] Primary Care Arish Redner: Troy Pear., [name] Other Clinician: Referring Leiyah Maultsby: Treating Sidonie Dexheimer/Extender: Troy Leitz., [name] Weeks in Treatment: 31 Compression Therapy Performed for Wound Assessment: Wound #4 Right,Distal,Lateral Lower Leg Performed By: Clinician [name] Compression Type: Four Layer Pre Treatment ABI: 1.1 Post Procedure  Diagnosis Same as Pre-procedure Electronic Signature(s) Signed: 05/15/2020 4:55:33 PM By: Troy Pilling Entered By: Troy Pilling on 05/15/2020 08:16:54 -------------------------------------------------------------------------------- Encounter Discharge Information Details Patient Name: Date of Service: Troy [place], [name] 05/15/2020 8:00 [name] Record Number: [id number] Patient Account Number: [id number] Date of Birth/Sex: Treating Patton: [birth year] (79 y.o. [name] Primary Care Marijke Guadiana: Troy Pear., [name] Other Clinician: Referring [name]: Treating [name]/Extender: Troy Leitz., Troy  [name] Weeks in Treatment: 31 Encounter Discharge Information Items Discharge Condition: Stable Ambulatory Status: Cane Discharge Destination: Emergency Room Telephoned: No Orders Sent: Yes Transportation: Other Accompanied By: wound center staff Schedule Follow-up Appointment: Yes Clinical Summary of Care: Patient Declined Notes pt taken to ED by wheelchair Electronic Signature(s) Signed: 05/15/2020 4:46:23 PM By: [name] Entered By: Troy Gouty on 05/15/2020 09:27:52 -------------------------------------------------------------------------------- Lower Extremity Assessment Details Patient Name: Date of Service: Troy [name], [name] 05/15/2020 8:00 [name] Record Number: [id number] Patient Account Number: [id number] Date of Birth/Sex: Treating Patton: 1940/11/05 (79 y.o. [name] Primary Care [name]: Troy Pear., [name] Other Clinician: Referring [name]: Treating [name]/Extender: Troy Leitz., [name] Weeks in Treatment: 31 Edema Assessment Assessed: [Left: No] [Right: No] Edema: [Left: Yes] [Right: Yes] Calf Left: Right: Point of Measurement: 43 cm From Medial Instep 34.5 cm 34 cm Ankle Left: Right: Point of Measurement: 14 cm From Medial Instep 22 cm 23.5 cm Vascular  Assessment Pulses: Dorsalis Pedis Palpable: [Left:No] [Right:No] Electronic Signature(s) Signed: 05/15/2020 4:34:39 PM By: [name] Entered By: [name] on 05/15/2020 07:57:14 -------------------------------------------------------------------------------- Multi Wound Chart Details Patient Name: Date of Service: Troy [place] 05/15/2020 8:00 [name] Medical Record Number: [id number] Patient Account Number: [id number] Date of Birth/Sex: Treating Patton: 11/17/1940 (79 y.o. [name], [name] Primary Care Rashaad Hallstrom: Troy Pear., [name] Other Clinician: Referring Fernado Brigante: Treating Tekisha Darcey/Extender: Troy Leitz., [name] Weeks in Treatment: 31 Vital Signs Height(in): 71 Pulse(bpm): 97 Weight(lbs): 198 Blood Pressure(mmHg): 114/70 Body Mass Index(BMI): 28 Temperature(F): 97.7 Respiratory Rate(breaths/min): 19 Photos: [3:No Photos Right, Proximal, Lateral Lower Leg] [4:No Photos Right, Distal, Lateral Lower Leg] [6:No Photos Left, Proximal, Lateral Lower Leg] Wound Location: [3:Blister] [4:Blister] [6:Gradually Appeared] Wounding Event: [3:Venous Leg Ulcer] [4:Venous Leg Ulcer] [6:Venous Leg Ulcer] Primary Etiology: [3:Anemia, Congestive Heart Failure,] [4:Anemia, Congestive Heart Failure,] [6:Anemia, Congestive Heart Failure,] Comorbid History: [3:Hypertension, Peripheral Venous Disease, End Stage Renal Disease Disease, End Stage Renal Disease Disease, 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] [4:Hypertension, Peripheral Venous 09/11/2019] [6:Hypertension, Peripheral Venous 12/13/2019] Date Acquired: [3:31] [4:31] [6:22] Weeks of Treatment: [3:Open] [4:Open] [6:Open] Wound Status: [3:1.5x1x0.5] [4:2.2x1.8x1.5] [6:1x0.5x0.2] Measurements L x W x D (cm) [3:1.178] [4:3.11] [6:0.393] Troy (cm) : rea [3:0.589] [4:4.665] [6:0.079] Volume (cm) : [3:76.20%] [4:66.60%] [6:88.20%] % Reduction in Area: [3:-19.00%] [4:-401.60%] [6:92.10%] % Reduction in Volume: [3:Full  Thickness Without Exposed] [4:Full Thickness Without Exposed] [6:Full Thickness Without Exposed] Classification: [3:Support Structures Medium] [4:Support Structures Medium] [6:Support Structures Small] Exudate Amount: [3:Serosanguineous] [4:Serosanguineous] [6:Serosanguineous] Exudate Type: [3:red, brown] [4:red, brown] [6:red, brown] Exudate Color: [3:Well defined, not attached] [4:Well defined, not attached] [6:Well defined, not attached] Wound Margin: [3:Large (67-100%)] [4:Medium (34-66%)] [6:Large (67-100%)] Granulation Amount: [3:Red, Pink] [4:Red, Pink] [6:Pink] Granulation Quality: [3:Small (1-33%)] [4:Medium (34-66%)] [6:Small (1-33%)] Necrotic Amount: [3:Fat Layer (Subcutaneous Tissue): Yes Fat Layer (Subcutaneous Tissue): Yes Fat Layer (Subcutaneous Tissue): Yes] Exposed Structures: [3:Fascia: No Tendon: No Muscle: No Joint: No Bone: No Small (1-33%)] [4:Fascia: No Tendon: No Muscle: No Joint: No Bone: No Small (1-33%)] [6:Fascia: No Tendon: No Muscle: No Joint: No Bone: No Small (1-33%)] Epithelialization: [3:Compression Therapy] [4:Compression Therapy] [6:Compression Therapy] Treatment Notes Electronic Signature(s) Signed: 05/15/2020 4:33:07 PM By: Linton Ham MD Signed: 05/15/2020 4:55:33 PM By: Troy Pilling Entered By: Linton Ham on 05/15/2020 08:56:13 -------------------------------------------------------------------------------- Multi-Disciplinary Care Plan Details Patient Name: Date of Service: Troy [place], [place] 05/15/2020 8:00 [name] Medical Record Number: [id number] Patient Account  Number: [id number] Date of Birth/Sex: Treating Patton: [birth year] (79 y.o. [name], [name] Primary Care Jenella Craigie: Troy Pear., [name] Other Clinician: Referring Tion Tse: Treating Yarimar Lavis/Extender: Troy Leitz., [name] Weeks in Treatment: 31 Active Inactive Venous Leg Ulcer Nursing Diagnoses: Knowledge deficit related to disease process and management Potential  for venous Insuffiency (use before diagnosis confirmed) Goals: Patient will maintain optimal edema control Date Initiated: 05/01/2020 Target Resolution Date: 05/29/2020 Goal Status: Active Interventions: Assess peripheral edema status every visit. Compression as ordered Provide education on venous insufficiency Notes: Wound/Skin Impairment Nursing Diagnoses: Knowledge deficit related to ulceration/compromised skin integrity Goals: Patient/caregiver will verbalize understanding of skin care regimen Date Initiated: 10/11/2019 Target Resolution Date: 05/29/2020 Goal Status: Active Interventions: Assess patient/caregiver ability to obtain necessary supplies Assess patient/caregiver ability to perform ulcer/skin care regimen upon admission and as needed Assess ulceration(s) every visit Provide education on ulcer and skin care Treatment Activities: Skin care regimen initiated : 10/11/2019 Topical wound management initiated : 10/11/2019 Notes: Electronic Signature(s) Signed: 05/15/2020 4:55:33 PM By: Troy Pilling Entered By: Troy Pilling on 05/15/2020 07:46:29 -------------------------------------------------------------------------------- Pain Assessment Details Patient Name: Date of Service: Troy [place] Troy Homans [place] 05/15/2020 8:00 [place] Record Number: [id number] Patient Account Number: [id number] Date of Birth/Sex: Treating Patton: [birth year] (79 y.o. [name] Primary Care Hokulani Rogel: Troy Pear., [name] Other Clinician: Referring Apryl Brymer: Treating Ronnae Kaser/Extender: Troy Leitz., [name] Weeks in Treatment: 31 Active Problems Location of Pain Severity and Description of Pain Patient Has Paino No Site Locations Pain Management and Medication Current Pain Management: Electronic Signature(s) Signed: 05/15/2020 4:34:39 PM By: [name] Entered By: [name] on 05/15/2020  07:52:40 -------------------------------------------------------------------------------- Patient/Caregiver Education Details Patient Name: Date of Service: Troy [place], [facility] 9/16/2021andnbsp8:00 [place] Record Number: [id number] Patient Account Number: [id number] Date of Birth/Gender: Treating Patton: [birth year] (79 y.o. [name] Primary Care Physician: Troy Pear., [name] Other Clinician: Referring Physician: Treating Physician/Extender: Troy Leitz., [name] Weeks in Treatment: 38 Education Assessment Education Provided To: Patient Education Topics Provided Wound/Skin Impairment: Handouts: Skin Care Do's and Dont's Methods: Explain/Verbal Responses: Reinforcements needed Electronic Signature(s) Signed: 05/15/2020 4:55:33 PM By: Troy Pilling Entered By: Troy Pilling on 05/15/2020 07:46:48 -------------------------------------------------------------------------------- Wound Assessment Details Patient Name: Date of Service: Troy [place] Troy Homans [place] 05/15/2020 8:00 [place] Record Number: [id number] Patient Account Number: [id number] Date of Birth/Sex: Treating Patton: 1940/09/29 (79 y.o. [name] Primary Care Gust Eugene: Troy Pear., [name] Other Clinician: Referring Rayanna Matusik: Treating Weslee Fogg/Extender: Troy Leitz., [name] Weeks in Treatment: 31 Wound Status Wound Number: 3 Primary Venous Leg Ulcer Etiology: Wound Location: Right, Proximal, Lateral Lower Leg Wound Open Wounding Event: Blister Status: Date Acquired: 09/11/2019 Comorbid Anemia, Congestive Heart Failure, Hypertension, Peripheral Weeks Of Treatment: 31 History: Venous Disease, End Stage Renal Disease Clustered Wound: No Wound Measurements Length: (cm) 1.5 Width: (cm) 1 Depth: (cm) 0.5 Area: (cm) 1.178 Volume: (cm) 0.589 % Reduction in Area: 76.2% % Reduction in Volume: -19% Epithelialization: Small (1-33%) Tunneling: No Undermining:  No Wound Description Classification: Full Thickness Without Exposed Support Structures Wound Margin: Well defined, not attached Exudate Amount: Medium Exudate Type: Serosanguineous Exudate Color: red, brown Foul Odor After Cleansing: No Slough/Fibrino Yes Wound Bed Granulation Amount: Large (67-100%) Exposed Structure Granulation Quality: Red, Pink Fascia Exposed: No Necrotic Amount: Small (1-33%) Fat Layer (Subcutaneous Tissue) Exposed: Yes Necrotic Quality: Adherent Slough Tendon Exposed: No  Muscle Exposed: No Joint Exposed: No Bone Exposed: No Treatment Notes Wound #3 (Right, Proximal, Lateral Lower Leg) 2. Periwound Care Moisturizing lotion 3. Primary Dressing Applied Collegen AG 4. Secondary Dressing ABD Pad Dry Gauze 6. Support Layer Applied 4 layer compression wrap Electronic Signature(s) Signed: 05/15/2020 4:34:39 PM By: [name] Entered By: [name] on 05/15/2020 08:01:14 -------------------------------------------------------------------------------- Wound Assessment Details Patient Name: Date of Service: Troy [place] Troy Homans [place] 05/15/2020 8:00 [name] Medical Record Number: [id number] Patient Account Number: [id number] Date of Birth/Sex: Treating Patton: Nov 03, 1940 (79 y.o. [name] Primary Care Ilhan Madan: Troy Pear., [name] Other Clinician: Referring Lashawna Poche: Treating Tonye Tancredi/Extender: Troy Leitz., [name] Weeks in Treatment: 31 Wound Status Wound Number: 4 Primary Venous Leg Ulcer Etiology: Wound Location: Right, Distal, Lateral Lower Leg Wound Open Wounding Event: Blister Status: Date Acquired: 09/11/2019 Comorbid Anemia, Congestive Heart Failure, Hypertension, Peripheral Weeks Of Treatment: 31 History: Venous Disease, End Stage Renal Disease Clustered Wound: No Wound Measurements Length: (cm) 2.2 Width: (cm) 1.8 Depth: (cm) 1.5 Area: (cm) 3.11 Volume: (cm) 4.665 % Reduction in Area: 66.6% %  Reduction in Volume: -401.6% Epithelialization: Small (1-33%) Tunneling: No Undermining: No Wound Description Classification: Full Thickness Without Exposed Support Structures Wound Margin: Well defined, not attached Exudate Amount: Medium Exudate Type: Serosanguineous Exudate Color: red, brown Foul Odor After Cleansing: No Slough/Fibrino Yes Wound Bed Granulation Amount: Medium (34-66%) Exposed Structure Granulation Quality: Red, Pink Fascia Exposed: No Necrotic Amount: Medium (34-66%) Fat Layer (Subcutaneous Tissue) Exposed: Yes Necrotic Quality: Adherent Slough Tendon Exposed: No Muscle Exposed: No Joint Exposed: No Bone Exposed: No Treatment Notes Wound #4 (Right, Distal, Lateral Lower Leg) 2. Periwound Care Moisturizing lotion 3. Primary Dressing Applied Collegen AG 4. Secondary Dressing ABD Pad Dry Gauze 6. Support Layer Applied 4 layer compression wrap Electronic Signature(s) Signed: 05/15/2020 4:34:39 PM By: [name] Entered By: [name] on 05/15/2020 08:00:51 -------------------------------------------------------------------------------- Wound Assessment Details Patient Name: Date of Service: Troy [place], [place] 05/15/2020 8:00 [name] Medical Record Number: [id number] Patient Account Number: [id number] Date of Birth/Sex: Treating Patton: 10-02-1940 (79 y.o. [name] Primary Care Koral Thaden: Troy Pear., [name] Other Clinician: Referring Sianne Tejada: Treating Keyoni Lapinski/Extender: Troy Leitz., [name] Weeks in Treatment: 31 Wound Status Wound Number: 6 Primary Venous Leg Ulcer Etiology: Wound Location: Left, Proximal, Lateral Lower Leg Wound Open Wounding Event: Gradually Appeared Status: Date Acquired: 12/13/2019 Comorbid Anemia, Congestive Heart Failure, Hypertension, Peripheral Weeks Of Treatment: 22 History: Venous Disease, End Stage Renal Disease Clustered Wound: No Wound Measurements Length: (cm) 1 Width:  (cm) 0.5 Depth: (cm) 0.2 Area: (cm) 0.393 Volume: (cm) 0.079 % Reduction in Area: 88.2% % Reduction in Volume: 92.1% Epithelialization: Small (1-33%) Tunneling: No Undermining: No Wound Description Classification: Full Thickness Without Exposed Support Structures Wound Margin: Well defined, not attached Exudate Amount: Small Exudate Type: Serosanguineous Exudate Color: red, brown Foul Odor After Cleansing: No Slough/Fibrino Yes Wound Bed Granulation Amount: Large (67-100%) Exposed Structure Granulation Quality: Pink Fascia Exposed: No Necrotic Amount: Small (1-33%) Fat Layer (Subcutaneous Tissue) Exposed: Yes Necrotic Quality: Adherent Slough Tendon Exposed: No Muscle Exposed: No Joint Exposed: No Bone Exposed: No Treatment Notes Wound #6 (Left, Proximal, Lateral Lower Leg) 2. Periwound Care Moisturizing lotion 3. Primary Dressing Applied Collegen AG 4. Secondary Dressing ABD Pad Dry Gauze 6. Support Layer Applied 4 layer compression wrap Electronic Signature(s) Signed: 05/15/2020 4:34:39 PM By: [name] Entered By: [name] on 05/15/2020 08:01:39 -------------------------------------------------------------------------------- Vitals  Details Patient Name: Date of Service: Troy [place], [place] 05/15/2020 8:00 [place] Record Number: [id number] Patient Account Number: [id number] Date of Birth/Sex: Treating Patton: 18-Apr-1941 (78 y.o. [name] Primary Care Aviah Sorci: Troy Pear., [name] Other Clinician: Referring Floretta Petro: Treating Cree Kunert/Extender: Troy Leitz., [name] Weeks in Treatment: 31 Vital Signs Time Taken: 07:50 Temperature (F): 97.7 Height (in): 71 Pulse (bpm): 97 Weight (lbs): 198 Respiratory Rate (breaths/min): 19 Body Mass Index (BMI): 27.6 Blood Pressure (mmHg): 114/70 Reference Range: 80 - 120 mg / dl Electronic Signature(s) Signed: 05/15/2020 4:34:39 PM By: [name] Entered By:  [name] on 05/15/2020 07:51:52

## 2020-05-15 NOTE — ED Triage Notes (Addendum)
Patient reports that he has been having constant "chest pressure" since yesterday and black watery stools. Patient also has abdominal distention. Patient was brought to the ED from the [facility].  Patient denies pain at this time. patient states he took a pain pill at 0700 today.

## 2020-05-15 NOTE — Consult Note (Signed)
Reason for Consult: Melena and chest pain Referring Physician: [facility]  [name] HPI: This is a 79 year old male with a PMH of CHF (EF <20%), history of an LA Grade C esophagitis, superficial gastric ulcers, HTN, PVD, CKD and multiple other medical problems admitted today for complaints of chest pressure and black stools.  Starting yesterday he reports having some black loose stools.  This was associated with chest pressure and he stated that it felt as if a "brick" was on his chest.  On 07/01/2019 he presented with complaints of melena and IDA.  An EGD performed by Dr. Rush Landmark was positive for an LA Grade C esophagitis and gastric ulcers.  He was supposed to be on PPIs routinely, but he does not report using any PPIs of late.  His admission HGB was at 5.8 g/dL and it was previously at 7.8 g/dL on 07/08/2019.  His BNP today was at 2,167 with an elevated troponin at 37.  Past Medical History:  Diagnosis Date  . Acute cholecystitis 11/23/2017  . Acute respiratory failure with hypoxia ([place])   . Acute upper back pain   . AKI (acute kidney injury) ([place])   . Alcohol abuse 02/26/2015  . Anemia, deficiency   . Back pain    LUMBOSACRAL  . Bronchitis, complicated   . Cardiomegaly   . Chronic combined systolic and diastolic CHF (congestive heart failure) ([place])   . Chronic pain    HIGH RISK MED USE COMPREHENSIVE HIGH RISK  . CKD (chronic kidney disease), stage III   . Demand ischemia ([place])    a. minimally elevated troponin peak 0.11 in 2016, felt demand ischemia. Cath offered but pt wished to have done back in [place].  . Diabetes mellitus without complication ([place])   . Edema, leg   . Elevated troponin 02/26/2015  . Essential hypertension   . Fatigue   . Heart abnormality   . Hyperlipidemia   . Hypertension   . Neck pain, acute   . PVD (peripheral vascular disease) ([place])   . Renal lesion 11/23/2017  . SOB (shortness of breath) on exertion   . Solitary kidney   . Vitamin D  deficiency     Past Surgical History:  Procedure Laterality Date  . ENDOSCOPIC RETROGRADE CHOLANGIOPANCREATOGRAPHY (ERCP) WITH PROPOFOL N/A 06/19/2019   Procedure: ENDOSCOPIC RETROGRADE CHOLANGIOPANCREATOGRAPHY (ERCP) WITH PROPOFOL;  Surgeon: Milus Banister, MD;  Location: [facility] ENDOSCOPY;  Service: Endoscopy;  Laterality: N/A;  . ESOPHAGOGASTRODUODENOSCOPY (EGD) WITH PROPOFOL N/A 07/01/2019   Procedure: ESOPHAGOGASTRODUODENOSCOPY (EGD) WITH PROPOFOL;  Surgeon: Rush Landmark Telford Nab., MD;  Location: [place];  Service: Gastroenterology;  Laterality: N/A;  . IR CHOLANGIOGRAM EXISTING TUBE  12/27/2017  . IR PERC CHOLECYSTOSTOMY  11/25/2017  . IR RADIOLOGIST EVAL & MGMT  02/02/2018  . REMOVAL OF STONES  06/19/2019   Procedure: REMOVAL OF STONES;  Surgeon: Milus Banister, MD;  Location: [facility] ENDOSCOPY;  Service: Endoscopy;;  . Joan Mayans  06/19/2019   Procedure: Joan Mayans;  Surgeon: Milus Banister, MD;  Location: [facility] ENDOSCOPY;  Service: Endoscopy;;    Family History  Problem Relation Age of Onset  . Other Mother        died in her sleep at 47  . Hypertension Sister   . Hyperlipidemia Sister   . Diabetes Mellitus II Sister   . Heart disease Sister   . CAD Neg Hx        neg hx premature CAD    Social History:  reports that he quit  smoking about 12 years ago. His smoking use included cigarettes. He has never used smokeless tobacco. He reports current alcohol use. He reports that he does not use drugs.  Allergies: No Known Allergies  Medications:  Scheduled: . furosemide  40 mg Intravenous Once  . metoprolol tartrate  2.5 mg Intravenous Q12H  . sodium chloride flush  3 mL Intravenous Q12H   Continuous: . pantoprozole (PROTONIX) infusion 8 mg/hr (05/15/20 1136)    Results for orders placed or performed during the hospital encounter of 05/15/20 (from the past 24 hour(s))  SARS Coronavirus 2 by RT PCR (hospital order, performed in [place] hospital lab) Nasopharyngeal  Nasopharyngeal Swab     Status: None   Collection Time: 05/15/20  9:44 AM   Specimen: Nasopharyngeal Swab  Result Value Ref Range   SARS Coronavirus 2 NEGATIVE NEGATIVE  Troponin I (High Sensitivity)     Status: Abnormal   Collection Time: 05/15/20 10:20 AM  Result Value Ref Range   Troponin I (High Sensitivity) 37 (H) <18 ng/L  Comprehensive metabolic panel     Status: Abnormal   Collection Time: 05/15/20 10:20 AM  Result Value Ref Range   Sodium 136 135 - 145 mmol/L   Potassium 5.0 3.5 - 5.1 mmol/L   Chloride 106 98 - 111 mmol/L   CO2 22 22 - 32 mmol/L   Glucose, Bld 105 (H) 70 - 99 mg/dL   BUN 96 (H) 8 - 23 mg/dL   Creatinine, Ser 2.11 (H) 0.61 - 1.24 mg/dL   Calcium 8.4 (L) 8.9 - 10.3 mg/dL   Total Protein 7.4 6.5 - 8.1 g/dL   Albumin 2.9 (L) 3.5 - 5.0 g/dL   AST 14 (L) 15 - 41 U/L   ALT 9 0 - 44 U/L   Alkaline Phosphatase 59 38 - 126 U/L   Total Bilirubin 1.0 0.3 - 1.2 mg/dL   GFR calc non Af Amer 30 (L) >60 mL/min   GFR calc Af Amer 34 (L) >60 mL/min   Anion gap 8 5 - 15  Type and screen [place]     Status: None (Preliminary result)   Collection Time: 05/15/20 10:20 AM  Result Value Ref Range   ABO/RH(D) A POS    Antibody Screen NEG    Sample Expiration 05/18/2020,2359    Unit Number [id number]    Blood Component Type RED CELLS,LR    Unit division 00    Status of Unit ISSUED    Transfusion Status OK TO TRANSFUSE    Crossmatch Result      Compatible Performed at [facility], [place] [address] [place], [place] [address]    Unit Number [id number]    Blood Component Type RED CELLS,LR    Unit division 00    Status of Unit ALLOCATED    Transfusion Status OK TO TRANSFUSE    Crossmatch Result Compatible   CBC with Differential     Status: Abnormal   Collection Time: 05/15/20 10:20 AM  Result Value Ref Range   WBC 5.5 4.0 - 10.5 K/uL   RBC 1.95 (L) 4.22 - 5.81 MIL/uL   Hemoglobin 5.8 (LL) 13.0 - 17.0 g/dL   HCT 18.6  (L) 39 - 52 %   MCV 95.4 80.0 - 100.0 fL   MCH 29.7 26.0 - 34.0 pg   MCHC 31.2 30.0 - 36.0 g/dL   RDW 15.6 (H) 11.5 - 15.5 %   Platelets 195 150 - 400 K/uL   nRBC  0.0 0.0 - 0.2 %   Neutrophils Relative % 84 %   Neutro Abs 4.7 1.7 - 7.7 K/uL   Lymphocytes Relative 9 %   Lymphs Abs 0.5 (L) 0.7 - 4.0 K/uL   Monocytes Relative 5 %   Monocytes Absolute 0.3 0 - 1 K/uL   Eosinophils Relative 1 %   Eosinophils Absolute 0.0 0 - 0 K/uL   Basophils Relative 0 %   Basophils Absolute 0.0 0 - 0 K/uL   Immature Granulocytes 1 %   Abs Immature Granulocytes 0.03 0.00 - 0.07 K/uL  Protime-INR     Status: None   Collection Time: 05/15/20 10:20 AM  Result Value Ref Range   Prothrombin Time 14.0 11.4 - 15.2 seconds   INR 1.1 0.8 - 1.2  Lipase, blood     Status: None   Collection Time: 05/15/20 10:20 AM  Result Value Ref Range   Lipase 26 11 - 51 U/L  Brain natriuretic peptide     Status: Abnormal   Collection Time: 05/15/20 10:20 AM  Result Value Ref Range   B Natriuretic Peptide 2,167.3 (H) 0.0 - 100.0 pg/mL  POC occult blood, ED     Status: Abnormal   Collection Time: 05/15/20 10:43 AM  Result Value Ref Range   Fecal Occult Bld POSITIVE (A) NEGATIVE  Prepare RBC (crossmatch)     Status: None   Collection Time: 05/15/20 11:02 AM  Result Value Ref Range   Order Confirmation      ORDER PROCESSED BY BLOOD BANK Performed at [facility], [place] [address] [place], [place] [address]      DG Chest 2 View  Result Date: 05/15/2020 CLINICAL DATA:  Chest pain. EXAM: CHEST - 2 VIEW COMPARISON:  June 18, 2019. FINDINGS: Mild cardiomegaly is noted. No pneumothorax or pleural effusion is noted. Both lungs are clear. The visualized skeletal structures are unremarkable. IMPRESSION: No active cardiopulmonary disease. Electronically Signed   By: Marijo Conception M.D.   On: 05/15/2020 09:45    ROS:  As stated above in the HPI otherwise negative.  Blood pressure 122/67, pulse 84,  temperature (!) 97.5 F (36.4 C), temperature source Oral, resp. rate 17, height 5\' 11"  (1.803 m), weight 88.9 kg, SpO2 100 %.    PE: Gen: NAD, Alert and Oriented HEENT:  [place]/AT, EOMI Neck: Supple, no LAD Lungs: CTA Bilaterally CV: RRR without M/G/R ABD: Soft, NTND, +BS Ext: No C/C/E  Assessment/Plan: 1) GI bleed. 2) Symptomatic anemia. 3) HFrHF. 4) History of esophagitis and PUD.   With the recurrent anemia and melenic stools, a repeat EGD will be performed.  It is likely that he has a recurrent source of bleeding in his upper GI tract.  Plan: 1) EGD tomorrow. 2) Agree with blood transfusion.  Mariene Dickerman D 05/15/2020, 2:08 PM

## 2020-05-16 ENCOUNTER — Inpatient Hospital Stay (HOSPITAL_COMMUNITY): Payer: Medicare Other | Admitting: Certified Registered"

## 2020-05-16 ENCOUNTER — Encounter (HOSPITAL_COMMUNITY): Payer: Self-pay | Admitting: Internal Medicine

## 2020-05-16 ENCOUNTER — Encounter (HOSPITAL_COMMUNITY)
Admission: EM | Disposition: A | Discharge status: Home / Self Care | Payer: Self-pay | Source: Home / Self Care | Attending: Internal Medicine

## 2020-05-16 DIAGNOSIS — K254 Chronic or unspecified gastric ulcer with hemorrhage: Secondary | ICD-10-CM

## 2020-05-16 DIAGNOSIS — I5022 Chronic systolic (congestive) heart failure: Secondary | ICD-10-CM

## 2020-05-16 HISTORY — PX: HOT HEMOSTASIS: SHX5433

## 2020-05-16 HISTORY — PX: ESOPHAGOGASTRODUODENOSCOPY (EGD) WITH PROPOFOL: SHX5813

## 2020-05-16 HISTORY — PX: BIOPSY: SHX5522

## 2020-05-16 LAB — BASIC METABOLIC PANEL
Anion gap: 11 (ref 5–15)
BUN: 88 mg/dL — ABNORMAL HIGH (ref 8–23)
CO2: 21 mmol/L — ABNORMAL LOW (ref 22–32)
Calcium: 9 mg/dL (ref 8.9–10.3)
Chloride: 106 mmol/L (ref 98–111)
Creatinine, Ser: 2.07 mg/dL — ABNORMAL HIGH (ref 0.61–1.24)
GFR calc Af Amer: 35 mL/min — ABNORMAL LOW (ref >60)
GFR calc non Af Amer: 30 mL/min — ABNORMAL LOW (ref >60)
Glucose, Bld: 96 mg/dL (ref 70–99)
Potassium: 4.9 mmol/L (ref 3.5–5.1)
Sodium: 138 mmol/L (ref 135–145)

## 2020-05-16 LAB — GLUCOSE, CAPILLARY: Glucose-Capillary: 85 mg/dL (ref 70–99)

## 2020-05-16 LAB — CBC
HCT: 22.3 % — ABNORMAL LOW (ref 39.0–52.0)
Hemoglobin: 7.2 g/dL — ABNORMAL LOW (ref 13.0–17.0)
MCH: 29.4 pg (ref 26.0–34.0)
MCHC: 32.3 g/dL (ref 30.0–36.0)
MCV: 91 fL (ref 80.0–100.0)
Platelets: 176 10*3/uL (ref 150–400)
RBC: 2.45 MIL/uL — ABNORMAL LOW (ref 4.22–5.81)
RDW: 17.2 % — ABNORMAL HIGH (ref 11.5–15.5)
WBC: 5.4 10*3/uL (ref 4.0–10.5)
nRBC: 0 % (ref 0.0–0.2)

## 2020-05-16 LAB — HEMOGLOBIN AND HEMATOCRIT, BLOOD
HCT: 26.7 % — ABNORMAL LOW (ref 39.0–52.0)
HCT: 25.6 % — ABNORMAL LOW (ref 39.0–52.0)
Hemoglobin: 8.4 g/dL — ABNORMAL LOW (ref 13.0–17.0)
Hemoglobin: 7.9 g/dL — ABNORMAL LOW (ref 13.0–17.0)

## 2020-05-16 SURGERY — ESOPHAGOGASTRODUODENOSCOPY (EGD) WITH PROPOFOL
Anesthesia: Monitor Anesthesia Care

## 2020-05-16 MED ORDER — PROPOFOL 10 MG/ML IV BOLUS
INTRAVENOUS | Status: DC | PRN
  Administered 2020-05-16 (×5): 20 mg via INTRAVENOUS
  Administered 2020-05-16: 40 mg via INTRAVENOUS

## 2020-05-16 MED ORDER — SODIUM CHLORIDE 0.9 % IV SOLN: INTRAVENOUS | Status: DC

## 2020-05-16 MED ORDER — CARVEDILOL 3.125 MG PO TABS
3.1250 mg | ORAL_TABLET | Freq: Two times a day (BID) | ORAL | Status: DC
  Administered 2020-05-16 – 2020-05-22 (×13): 3.125 mg via ORAL
  Filled 2020-05-16 (×13): qty 1

## 2020-05-16 MED ORDER — LIDOCAINE 2% (20 MG/ML) 5 ML SYRINGE
INTRAMUSCULAR | Status: DC | PRN
  Administered 2020-05-16: 40 mg via INTRAVENOUS

## 2020-05-16 SURGICAL SUPPLY — 15 items

## 2020-05-16 NOTE — Op Note (Signed)
[facility] Patient Name: [name] Procedure Date: 05/16/2020 MRN: [id number] Attending MD: Carol Ada , MD Date of Birth: [birth year] CSN: [id number] Age: 79 Admit Type: Emergency Department Procedure:                Upper GI endoscopy Indications:              Melena Providers:                Carol Ada, MD, Cleda Daub, RN, Cherylynn Ridges,                            Technician, Glenis Smoker, CRNA Referring MD:              Medicines:                Propofol per Anesthesia Complications:            No immediate complications. Estimated Blood Loss:     Estimated blood loss: none. Procedure:                Pre-Anesthesia Assessment:                           - Prior to the procedure, a History and Physical                            was performed, and patient medications and                            allergies were reviewed. The patient's tolerance of                            previous anesthesia was also reviewed. The risks                            and benefits of the procedure and the sedation                            options and risks were discussed with the patient.                            All questions were answered, and informed consent                            was obtained. Prior Anticoagulants: The patient has                            taken no previous anticoagulant or antiplatelet                            agents. ASA Grade Assessment: III - A patient with                            severe systemic disease. After reviewing the risks  and benefits, the patient was deemed in                            satisfactory condition to undergo the procedure.                           - Sedation was administered by an anesthesia                            professional. Deep sedation was attained.                           After obtaining informed consent, the endoscope was                            passed under  direct vision. Throughout the                            procedure, the patient's blood pressure, pulse, and                            oxygen saturations were monitored continuously. The                            [id number]-[id number] ([id number] Olympus gastroscope was                            introduced through the mouth, and advanced to the                            second part of duodenum. The upper GI endoscopy was                            accomplished without difficulty. The patient                            tolerated the procedure well. Scope In: Scope Out: Findings:      A 4 cm hiatal hernia was present.      Two non-bleeding cratered and superficial gastric ulcers with no       stigmata of bleeding were found in the gastric antrum. The largest       lesion was 5 mm in largest dimension. Biopsies were taken with a cold       forceps for Helicobacter pylori testing.      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 Coagulation for tissue destruction using monopolar probe       was successful.      There was no evidence of an esophagitis during this procedure. Two       nonbleeding mildly cratered antral ulcers were identified. In the       duodenal bulb, a clasic appearing nonbleeding AVM was ablated with APC.       There were some other small atypical vessels in the duodenal bulb that       were also ablated. Impression:               -  4 cm hiatal hernia.                           - Non-bleeding gastric ulcers with no stigmata of                            bleeding. Biopsied.                           - A single non-bleeding angiodysplastic lesion in                            the duodenum. Treated with a monopolar probe. Moderate Sedation:      Not Applicable - Patient had care per Anesthesia. Recommendation:           - Return patient to hospital ward for ongoing care.                           - Resume regular diet.                            - Continue present medications.                           - Await pathology results.                           - Monitor HGB and transfuse as necessary.                           - PPI BID x 1 month and then QD indefinitely. Procedure Code(s):        --- Professional ---                           [id number], Esophagogastroduodenoscopy, flexible,                            transoral; with ablation of tumor(s), polyp(s), or                            other lesion(s) (includes pre- and post-dilation                            and guide wire passage, when performed)                           43239, 58, Esophagogastroduodenoscopy, flexible,                            transoral; with biopsy, single or multiple Diagnosis Code(s):        --- Professional ---                           K25.9, Gastric ulcer, unspecified as acute or  chronic, without hemorrhage or perforation                           K44.9, Diaphragmatic hernia without obstruction or                            gangrene                           K31.819, Angiodysplasia of stomach and duodenum                            without bleeding                           K92.1, Melena (includes Hematochezia) CPT copyright 2019 American [employer] Association. All rights reserved. The codes documented in this report are preliminary and upon coder review may  be revised to meet current compliance requirements. Carol Ada, MD Carol Ada, MD 05/16/2020 8:00:21 AM This report has been signed electronically. Number of Addenda: 0

## 2020-05-16 NOTE — Anesthesia Postprocedure Evaluation (Signed)
Anesthesia Post Note  Patient: [name]  Procedure(s) Performed: ESOPHAGOGASTRODUODENOSCOPY (EGD) WITH PROPOFOL (N/A ) BIOPSY HOT HEMOSTASIS (ARGON PLASMA COAGULATION/BICAP) (N/A )     Patient location during evaluation: Endoscopy Anesthesia Type: MAC Level of consciousness: awake and alert Pain management: pain level controlled Vital Signs Assessment: post-procedure vital signs reviewed and stable Respiratory status: spontaneous breathing, nonlabored ventilation, respiratory function stable and patient connected to nasal cannula oxygen Cardiovascular status: blood pressure returned to baseline and stable Postop Assessment: no apparent nausea or vomiting Anesthetic complications: no   No complications documented.  Last Vitals:  Vitals:   05/16/20 0820 05/16/20 0830  BP: (!) 121/40 (!) 119/51  Pulse: 72   Resp: 17 17  Temp:    SpO2: 100%     Last Pain:  Vitals:   05/16/20 0830  TempSrc:   PainSc: 0-No pain                 [name] DANIEL

## 2020-05-16 NOTE — Transfer of Care (Signed)
Immediate Anesthesia Transfer of Care Note  Patient: [name]  Procedure(s) Performed: ESOPHAGOGASTRODUODENOSCOPY (EGD) WITH PROPOFOL (N/A ) BIOPSY HOT HEMOSTASIS (ARGON PLASMA COAGULATION/BICAP) (N/A )  Patient Location: PACU and Endoscopy Unit  Anesthesia Type:MAC  Level of Consciousness: awake and alert   Airway & Oxygen Therapy: Patient Spontanous Breathing and Patient connected to face mask oxygen  Post-op Assessment: Report given to RN and Post -op Vital signs reviewed and stable  Post vital signs: Reviewed and stable  Last Vitals:  Vitals Value Taken Time  BP    Temp    Pulse    Resp    SpO2      Last Pain:  Vitals:   05/16/20 0708  TempSrc: Oral  PainSc: 0-No pain         Complications: No complications documented.

## 2020-05-16 NOTE — Progress Notes (Addendum)
PROGRESS NOTE    [name]  VEL:[id number] DOB: 07-Dec-1940 DOA: 05/15/2020 PCP: [name], MD    Chief Complaint  Patient presents with  . Chest Pain  . abdominal distention  . 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  Has had intermittent shortness of breath, fatigue.  Denies any abdominal pain, fevers, nausea, vomiting.  States his stool has been black with some minimal bright red blood.  He states that this a.m. he took 2 aspirin to his chest discomfort which he described as a brick on his chest.  He has not been taking his Coumadin and does not know when his last dose was.  Has been taking his other medications. Of note, patient had an EGD this past October which showed erythematous mucosa in the gastric body and antrum as well as nonbleeding gastric ulcers with clean ulcer base.  ED Course: Hemodynamically stable on room air.  Notable labs: BUN 96 (baseline 40), creatinine 2.11 (at baseline), BNP 2167 (chronically elevated), troponin 37, Hb 5.8, platelets 195, FOBT positive, INR 1.1.  EKG appears at baseline with PACs.  CXR unremarkable.  2 units PRBCs ordered by ED physician and Protonix drip started.  ED consulted Dr. Benson Norway, GI, who will see the patient.  Subjective:  Reports had one dark/black  bm after returned from EGD He denies pain, no sob    Assessment & Plan:   Principal Problem:   GI bleed with acute blood loss anemia, suspected upper Active Problems:   Hyperlipidemia   Essential hypertension   Anemia of  chronic disease   CKD (chronic kidney disease), stage IV (HCC)   Chest pain  Symptomatic acute blood loss anemia secondary to GI bleed, acute on chronic anemia, baseline hemoglobin range from 7-8 per chart review -hgb on presentation is 5.8 -Received PRBC x2 units -Status post EGD on September 17 showed "non-bleeding gastric ulcers with no stigmata of bleeding. Biopsied.  A single non-bleeding angiodysplastic lesion in the duodenum. Treated with a monopolar probe" -Has been on Protonix drip since admission, plan transition to PPI twice daily okay with GI, patient will need PPI twice daily for a month then daily indefinitely, avoid alcohol -Monitor H&H, transfuse if hemoglobin less than 7  Chronic systolic CHF, he has not been following cardiology, there are several no shows and cancelled appointment -Last EF less than 20% -Home meds Coreg and torsemide held due to GI bleed, she received IV Lopressor -Now she is postprocedure, GI okay to resume diet, will DC IV Lopressor, change to Coreg -torsemide held since admission, likely able to resume today, but awaiting bmp result, will follow up later today   History of LV thrombus  -diagnosed in October 2020 echo showed "Small, fixed thrombus on the apical wall of the left ventricle" -Was on Coumadin, report currently not taking, reports run out several months -INR 1.1 on presentation -Per chart review he has multiple no-show and canceled appointment with cardiology, he reports not able to afford the copay  Chronic bilateral lower extremity wound lymphedema Wound care consulted  CKD  3B/CKD 4 Creatinine appears at baseline Renal dosing meds   BPH with chronic indwelling catheter, reports get changed one a month at urology's office Last changed Tuesday on 9/8  History of alcohol use Advise avoid alcohol  DVT prophylaxis: SCDs Start: 05/15/20 1333   Code Status: Full Family Communication: Patient Disposition:   Status is:  Inpatient  Dispo: The patient is from: home, live alone              Anticipated d/c is to: home              Anticipated d/c date is: transfer out of stepdown, possible tomorrow when finishing ppi drip, with hgb stable and gi clearance                Consultants:   GI  Procedures:   EGD  PRBC transfusion  Antimicrobials:   None     Objective: Vitals:   05/16/20 0810 05/16/20 0820 05/16/20 0830 05/16/20 1157  BP: (!) 119/57 (!) 121/40 (!) 119/51   Pulse: 72 72    Resp: 20 17 17    Temp:    97.9 F (36.6 C)  TempSrc:    Axillary  SpO2: 100% 100%    Weight:      Height:        Intake/Output Summary (Last 24 hours) at 05/16/2020 1213 Last data filed at 05/16/2020 0756 Gross per 24 hour  Intake 907.1 ml  Output 2901 ml  Net -1993.9 ml   Filed Weights   05/15/20 0903 05/16/20 0415 05/16/20 0708  Weight: 88.9 kg 84.5 kg 84.5 kg    Examination:  General exam: calm, NAD, chronic indwelling urinary catheter  Respiratory system: Clear to auscultation. Respiratory effort normal. Cardiovascular system: S1 & S2 heard, RRR. No JVD, no murmur, No pedal edema. Gastrointestinal system: protuberance ab, with small umbilical hernia , soft and nontender.  Normal bowel sounds heard. Central nervous system: Alert and oriented. No focal neurological deficits. Extremities: Symmetric 5 x 5 power. Skin: No rashes, lesions or ulcers Psychiatry: Judgement and insight appear normal. Mood & affect appropriate.     Data Reviewed: I have personally reviewed following labs and imaging studies  CBC: Recent Labs  Lab 05/15/20 1020 05/15/20 2330 05/16/20 0555  WBC 5.5  --  5.4  NEUTROABS 4.7  --   --   HGB 5.8* 7.3* 7.2*  HCT 18.6* 22.3* 22.3*  MCV 95.4  --  91.0  PLT 195  --  008    Basic Metabolic Panel: Recent Labs  Lab 05/15/20 1020 05/16/20 0555  NA 136 138  K 5.0 4.9  CL 106 106  CO2 22 21*  GLUCOSE 105* 96  BUN 96* 88*  CREATININE 2.11* 2.07*  CALCIUM 8.4*  9.0    GFR: Estimated Creatinine Clearance: 32.3 mL/min (A) (by C-G formula based on SCr of 2.07 mg/dL (H)).  Liver Function Tests: Recent Labs  Lab 05/15/20 1020  AST 14*  ALT 9  ALKPHOS 59  BILITOT 1.0  PROT 7.4  ALBUMIN 2.9*    CBG: Recent Labs  Lab 05/15/20 1701 05/16/20 0858  GLUCAP 78 85     Recent Results (from the past 240 hour(s))  SARS Coronavirus 2 by RT PCR (hospital order, performed in [facility] hospital lab) Nasopharyngeal Nasopharyngeal Swab     Status: None   Collection Time: 05/15/20  9:44 AM   Specimen: Nasopharyngeal Swab  Result Value Ref Range Status   SARS Coronavirus 2 NEGATIVE NEGATIVE Final  Comment: (NOTE) SARS-CoV-2 target nucleic acids are NOT DETECTED.  The SARS-CoV-2 RNA is generally detectable in upper and lower respiratory specimens during the acute phase of infection. The lowest concentration of SARS-CoV-2 viral copies this assay can detect is 250 copies / mL. A negative result does not preclude SARS-CoV-2 infection and should not be used as the sole basis for treatment or other patient management decisions.  A negative result may occur with improper specimen collection / handling, submission of specimen other than nasopharyngeal swab, presence of viral mutation(s) within the areas targeted by this assay, and inadequate number of viral copies (<250 copies / mL). A negative result must be combined with clinical observations, patient history, and epidemiological information.  Fact Sheet for Patients:   StrictlyIdeas.no  Fact Sheet for Healthcare Providers: BankingDealers.co.za  This test is not yet approved or  cleared by the [place] FDA and has been authorized for detection and/or diagnosis of SARS-CoV-2 by FDA under an Emergency Use Authorization (EUA).  This EUA will remain in effect (meaning this test can be used) for the duration of the COVID-19 declaration under  Section 564(b)(1) of the Act, 21 U.S.C. section 360bbb-3(b)(1), unless the authorization is terminated or revoked sooner.  Performed at [facility], [place] [address], [place], [place] [address]   MRSA PCR Screening     Status: None   Collection Time: 05/15/20  4:48 PM   Specimen: Nasopharyngeal  Result Value Ref Range Status   MRSA by PCR NEGATIVE NEGATIVE Final    Comment:        The GeneXpert MRSA Assay (FDA approved for NASAL specimens only), is one component of a comprehensive MRSA colonization surveillance program. It is not intended to diagnose MRSA infection nor to guide or monitor treatment for MRSA infections. Performed at [facility], [place] [address], [place],  [address]          Radiology Studies: DG Chest 2 View  Result Date: 05/15/2020 CLINICAL DATA:  Chest pain. EXAM: CHEST - 2 VIEW COMPARISON:  June 18, 2019. FINDINGS: Mild cardiomegaly is noted. No pneumothorax or pleural effusion is noted. Both lungs are clear. The visualized skeletal structures are unremarkable. IMPRESSION: No active cardiopulmonary disease. Electronically Signed   By: Marijo Conception M.D.   On: 05/15/2020 09:45        Scheduled Meds: . carvedilol  3.125 mg Oral BID  . Chlorhexidine Gluconate Cloth  6 each Topical Daily  . mouth rinse  15 mL Mouth Rinse BID  . sodium chloride flush  10-40 mL Intracatheter Q12H  . sodium chloride flush  3 mL Intravenous Q12H   Continuous Infusions: . pantoprozole (PROTONIX) infusion 8 mg/hr (05/15/20 2346)     LOS: 1 day   Time spent: 67mins Greater than 50% of this time was spent in counseling, explanation of diagnosis, planning of further management, and coordination of care.  I have personally reviewed and interpreted on  05/16/2020 daily labs, tele strips, imagings as discussed above under date review session and assessment and plans.  I reviewed all nursing notes, pharmacy notes, consultant  notes,  vitals, pertinent old records  I have discussed plan of care as described above with RN , patient and family on 05/16/2020  Voice Recognition /Dragon dictation system was used to create this note, attempts have been made to correct errors. Please contact the author with questions and/or clarifications.   Florencia Reasons, MD PhD FACP [facility]  Available via Epic secure chat  7am-7pm for nonurgent issues Please page for urgent issues To page the attending provider between 7A-7P or the covering provider during after hours 7P-7A, please log into the web site [email or web address] and access using universal [place] password for that web site. If you do not have the password, please call the hospital operator.    05/16/2020, 12:13 PM

## 2020-05-16 NOTE — Progress Notes (Signed)
Responded to consult to check midline. Occlussion unresolved with extension tubing and dressing change. Discontinued line. Covering RN states no immediate need for 2nd IV site at this time. VAST available if need arises; RN agrees to consult if needed.

## 2020-05-16 NOTE — Interval H&P Note (Signed)
History and Physical Interval Note:  05/16/2020 7:30 AM  [name]  has presented today for surgery, with the diagnosis of Upper GI bleed.  The various methods of treatment have been discussed with the patient and family. After consideration of risks, benefits and other options for treatment, the patient has consented to  Procedure(s): ESOPHAGOGASTRODUODENOSCOPY (EGD) WITH PROPOFOL (N/A) as a surgical intervention.  The patient's history has been reviewed, patient examined, no change in status, stable for surgery.  I have reviewed the patient's chart and labs.  Questions were answered to the patient's satisfaction.     Raini Tiley D

## 2020-05-16 NOTE — Consult Note (Signed)
WOC consulted for chronic lower extremity wounds and lymphedema.  Supplies ordered based on POC established at the [facility].   Arrive to change wraps and patient proceeded to tell me they are changed on Tues/Thur and Saturday and were just applied yesterday prior to his arrival to the ED.  They are intact and clean. I will leave the supplies in the room for use should they need them over the weekend. The [facility] nursing team is not on the campus on the weekends. We will plan to assess patient on Monday to determine needs for change at that time.   [place], [place], [place]

## 2020-05-16 NOTE — Anesthesia Preprocedure Evaluation (Addendum)
Anesthesia Evaluation  Patient identified by MRN, date of birth, ID band Patient awake    Reviewed: Allergy & Precautions, NPO status , Patient's Chart, lab work & pertinent test results  History of Anesthesia Complications Negative for: history of anesthetic complications  Airway Mallampati: II   Neck ROM: Limited    Dental  (+) Dental Advisory Given, Missing, Poor Dentition   Pulmonary former smoker,    Pulmonary exam normal  (-) decreased breath sounds      Cardiovascular hypertension, Pt. on home beta blockers  Rhythm:Regular Rate:Normal     Neuro/Psych negative neurological ROS     GI/Hepatic Neg liver ROS,   Endo/Other  diabetes  Renal/GU ARFRenal disease     Musculoskeletal negative musculoskeletal ROS (+)   Abdominal Normal abdominal exam  (+)   Peds  Hematology  (+) anemia ,   Anesthesia Other Findings Study Conclusions  - Left ventricle: The cavity size was normal. Wall thickness was   increased in a pattern of moderate LVH. Systolic function was   severely reduced. The estimated ejection fraction was in the   range of 20% to 25%. Diffuse hypokinesis. The study is not   technically sufficient to allow evaluation of LV diastolic   function. - Mitral valve: Mildly thickened leaflets . There was trivial   regurgitation. - Left atrium: The atrium was normal in size. - Right ventricle: The cavity size was mildly dilated. - Tricuspid valve: There was trivial regurgitation. - Pulmonary arteries: PA peak pressure: 26 mm Hg (S). - Inferior vena cava: The vessel was dilated. The respirophasic   diameter changes were blunted (< 50%), consistent with elevated   central venous pressure.  Impressions:  - Compared to a prior study in 2016, the LVEF is slightly lower at   20-25%.   Reproductive/Obstetrics                          Anesthesia Physical  Anesthesia Plan  ASA:  IV  Anesthesia Plan: MAC   Post-op Pain Management:    Induction:   PONV Risk Score and Plan: 1 and Ondansetron and Propofol infusion  Airway Management Planned: Natural Airway  Additional Equipment: None  Intra-op Plan:   Post-operative Plan:   Informed Consent: I have reviewed the patients History and Physical, chart, labs and discussed the procedure including the risks, benefits and alternatives for the proposed anesthesia with the patient or authorized representative who has indicated his/her understanding and acceptance.       Plan Discussed with: CRNA and Anesthesiologist  Anesthesia Plan Comments:        Anesthesia Quick Evaluation

## 2020-05-16 NOTE — Anesthesia Procedure Notes (Signed)
Procedure Name: MAC Date/Time: 05/16/2020 7:36 AM Performed by: Cynda Familia, CRNA Pre-anesthesia Checklist: Patient identified, Emergency Drugs available, Suction available, Patient being monitored and Timeout performed Patient Re-evaluated:Patient Re-evaluated prior to induction Oxygen Delivery Method: Simple face mask Placement Confirmation: positive ETCO2 and breath sounds checked- equal and bilateral Dental Injury: Teeth and Oropharynx as per pre-operative assessment

## 2020-05-17 DIAGNOSIS — N1832 Chronic kidney disease, stage 3b: Secondary | ICD-10-CM

## 2020-05-17 DIAGNOSIS — K922 Gastrointestinal hemorrhage, unspecified: Secondary | ICD-10-CM

## 2020-05-17 DIAGNOSIS — D649 Anemia, unspecified: Secondary | ICD-10-CM

## 2020-05-17 DIAGNOSIS — D638 Anemia in other chronic diseases classified elsewhere: Secondary | ICD-10-CM

## 2020-05-17 DIAGNOSIS — I5022 Chronic systolic (congestive) heart failure: Secondary | ICD-10-CM

## 2020-05-17 DIAGNOSIS — K259 Gastric ulcer, unspecified as acute or chronic, without hemorrhage or perforation: Secondary | ICD-10-CM

## 2020-05-17 LAB — BASIC METABOLIC PANEL
Anion gap: 8 (ref 5–15)
BUN: 71 mg/dL — ABNORMAL HIGH (ref 8–23)
CO2: 20 mmol/L — ABNORMAL LOW (ref 22–32)
Calcium: 8.5 mg/dL — ABNORMAL LOW (ref 8.9–10.3)
Chloride: 109 mmol/L (ref 98–111)
Creatinine, Ser: 2.09 mg/dL — ABNORMAL HIGH (ref 0.61–1.24)
GFR calc Af Amer: 35 mL/min — ABNORMAL LOW (ref >60)
GFR calc non Af Amer: 30 mL/min — ABNORMAL LOW (ref >60)
Glucose, Bld: 107 mg/dL — ABNORMAL HIGH (ref 70–99)
Potassium: 4.6 mmol/L (ref 3.5–5.1)
Sodium: 137 mmol/L (ref 135–145)

## 2020-05-17 LAB — HEMOGLOBIN AND HEMATOCRIT, BLOOD
HCT: 22.1 % — ABNORMAL LOW (ref 39.0–52.0)
HCT: 23.8 % — ABNORMAL LOW (ref 39.0–52.0)
Hemoglobin: 7 g/dL — ABNORMAL LOW (ref 13.0–17.0)
Hemoglobin: 7.3 g/dL — ABNORMAL LOW (ref 13.0–17.0)

## 2020-05-17 LAB — GLUCOSE, CAPILLARY
Glucose-Capillary: 107 mg/dL — ABNORMAL HIGH (ref 70–99)
Glucose-Capillary: 107 mg/dL — ABNORMAL HIGH (ref 70–99)

## 2020-05-17 LAB — MAGNESIUM: Magnesium: 2.3 mg/dL (ref 1.7–2.4)

## 2020-05-17 MED ORDER — ACETAMINOPHEN 325 MG PO TABS
650.0000 mg | ORAL_TABLET | Freq: Four times a day (QID) | ORAL | Status: DC | PRN
  Administered 2020-05-18: 650 mg via ORAL
  Filled 2020-05-17: qty 2

## 2020-05-17 NOTE — Evaluation (Signed)
Physical Therapy Evaluation Patient Details Name: [name] MRN: [id number] DOB: 01-Nov-1940 Today's Date: 05/17/2020   History of Present Illness  79 yo male admitted with GI bleed, anemia. Hx of HF, CKD, lymphedema, DM, ETOH abuse, LE wounds, PVD, BPH-chronic indwelling catheter  Clinical Impression  On eval, pt was Min guard assist for mobility. He walked ~25 feet x 2 with use of his cane. Seated rest break taken after ~25 feet 2* fatigue, dyspnea, and some lightheadedness. After a few minutes, pt was able to ambulate back to room. Discussed d/c plan-pt stated his son is visiting from out of town and that he can assist as needed. Pt is agreeable to HHPT f/u. Will continue to follow and progress activity as tolerated. Recommend daily ambulation in hallway with nursing supervision/assist, in addition to PT visits, to increase activity level.     Follow Up Recommendations Home health PT;Supervision/Assistance - 24 hour    Equipment Recommendations  None recommended by PT    Recommendations for Other Services OT consult     Precautions / Restrictions Precautions Precautions: Fall Restrictions Weight Bearing Restrictions: No      Mobility  Bed Mobility               General bed mobility comments: sitting EOB at start of session  Transfers Overall transfer level: Needs assistance Equipment used: Straight cane Transfers: Sit to/from Stand Sit to Stand: Min guard         General transfer comment: Increased time. Min guard for safety but no physical assist given  Ambulation/Gait Ambulation/Gait assistance: Min guard Gait Distance (Feet): 25 Feet (x2) Assistive device: Straight cane Gait Pattern/deviations: Step-through pattern;Decreased stride length     General Gait Details: Slow gait speed. Mildly unsteady but no overt LOB. Pt became fatigued, dyspneic, and lightheaded after about 25 feet-required seated reat break. He was then able to ambulate back to the  room. O2 >90% on RA  Stairs            Wheelchair Mobility    Modified Rankin (Stroke Patients Only)       Balance Overall balance assessment: Needs assistance         Standing balance support: Bilateral upper extremity supported Standing balance-Leahy Scale: Poor                               Pertinent Vitals/Pain Pain Assessment: No/denies pain    Home Living Family/patient expects to be discharged to:: Private residence Living Arrangements: Alone Available Help at Discharge: Family;Available PRN/intermittently (son visiting from out of town) Type of Home: Apartment Home Access: Level entry     Home Layout: One [place]: [place] - single point;Walker - 4 wheels      Prior Function Level of Independence: Independent with assistive device(s)         Comments: used SPC vs rollator for mobility, independent ADLs/IADLs (not driving-uses SCAT)     Hand Dominance        Extremity/Trunk Assessment   Upper Extremity Assessment Upper Extremity Assessment: Generalized weakness    Lower Extremity Assessment Lower Extremity Assessment: Generalized weakness    Cervical / Trunk Assessment Cervical / Trunk Assessment: Normal  Communication   Communication: No difficulties  Cognition Arousal/Alertness: Awake/alert Behavior During Therapy: WFL for tasks assessed/performed Overall Cognitive Status: Within Functional Limits for tasks assessed  General Comments      Exercises     Assessment/Plan    PT Assessment Patient needs continued PT services  PT Problem List Decreased strength;Decreased mobility;Decreased activity tolerance;Decreased balance       PT Treatment Interventions DME instruction;Gait training;Therapeutic activities;Therapeutic exercise;Patient/family education;Balance training;Functional mobility training    PT Goals (Current goals can be found in the Care  Plan section)  Acute Rehab PT Goals Patient Stated Goal: to get better and get back home PT Goal Formulation: With patient Time For Goal Achievement: 05/31/20 Potential to Achieve Goals: Good    Frequency Min 3X/week   Barriers to discharge        Co-evaluation               AM-PAC PT "6 Clicks" Mobility  Outcome Measure Help needed turning from your back to your side while in a flat bed without using bedrails?: A Little Help needed moving from lying on your back to sitting on the side of a flat bed without using bedrails?: A Little Help needed moving to and from a bed to a chair (including a wheelchair)?: A Little Help needed standing up from a chair using your arms (e.g., wheelchair or bedside chair)?: A Little Help needed to walk in hospital room?: A Little Help needed climbing 3-5 steps with a railing? : A Lot 6 Click Score: 17    End of Session Equipment Utilized During Treatment: Gait belt;Oxygen Activity Tolerance: Patient limited by fatigue Patient left: in bed;with call bell/phone within reach   PT Visit Diagnosis: Muscle weakness (generalized) (M62.81);Difficulty in walking, not elsewhere classified (R26.2)    Time: 3614-4315 PT Time Calculation (min) (ACUTE ONLY): 16 min   Charges:   PT Evaluation $PT Eval Low Complexity: [place], PT Acute Rehabilitation  Office: [phone] Pager: [phone]

## 2020-05-17 NOTE — Progress Notes (Signed)
[facility] ROUNDING NOTE /Weekend coverage for Dr Mann/DrHung   Subjective: No complaints, feels well. No BM since yesterday   Objective: Vital signs in last 24 hours: Temp:  [97.5 F (36.4 C)-98.3 F (36.8 C)] 97.8 F (36.6 C) (09/18 1213) Pulse Rate:  [66-92] 72 (09/18 1213) Resp:  [14-24] 20 (09/18 1213) BP: (91-134)/(29-68) 114/68 (09/18 1213) SpO2:  [89 %-100 %] 100 % (09/18 1213) Weight:  [84 kg] 84 kg (09/18 0500) Last BM Date: 05/16/20 General: NAD   Intake/Output from previous day: 09/17 0701 - 09/18 0700 In: 200 [I.V.:200] Out: 1600 [Urine:1600] Intake/Output this shift: No intake/output data recorded.   Lab Results: Recent Labs    05/15/20 1020 05/15/20 2330 05/16/20 0555 05/16/20 1319 05/16/20 1705 05/17/20 1115 05/17/20 1647  WBC 5.5  --  5.4  --   --   --   --   HGB 5.8*   < > 7.2*   < > 7.9* 7.0* 7.3*  PLT 195  --  176  --   --   --   --   MCV 95.4  --  91.0  --   --   --   --    < > = values in this interval not displayed.   BMET Recent Labs    05/15/20 1020 05/16/20 0555 05/17/20 1115  NA 136 138 137  K 5.0 4.9 4.6  CL 106 106 109  CO2 22 21* 20*  GLUCOSE 105* 96 107*  BUN 96* 88* 71*  CREATININE 2.11* 2.07* 2.09*  CALCIUM 8.4* 9.0 8.5*   LFT Recent Labs    05/15/20 1020  PROT 7.4  ALBUMIN 2.9*  AST 14*  ALT 9  ALKPHOS 59  BILITOT 1.0   PT/INR Recent Labs    05/15/20 1020  INR 1.1      Imaging/Other results: No results found.    Assessment &Plan  79 yr M with h/o CHF, ETOH abuse, DM admitted with anemia, GI bleed  S/p EGD by Dr Benson Norway yesterday showed clean based gastric ulcers and ?duodenal avm s/p ablation  PPI BID X 2 months followed by once daily  Monitor Hgb and transfuse as needed  Avoid NSAID's and alcohol cessation  Advance diet as tolerated  Await pathology results  GI will sign off, available if have any questions     K. Denzil Magnuson , MD [phone]  [facility]  Gastroenterology

## 2020-05-18 LAB — TYPE AND SCREEN
ABO/RH(D): A POS
Antibody Screen: NEGATIVE
Unit division: 0
Unit division: 0
Unit division: 0
Unit division: 0

## 2020-05-18 LAB — BPAM RBC
Blood Product Expiration Date: 202110062359
Blood Product Expiration Date: 202110062359
Blood Product Expiration Date: 202110072359
Blood Product Expiration Date: 202110072359
ISSUE DATE / TIME: 202109161256
ISSUE DATE / TIME: 202109161739
Unit Type and Rh: 6200
Unit Type and Rh: 6200
Unit Type and Rh: 6200
Unit Type and Rh: 6200

## 2020-05-18 LAB — HEMOGLOBIN AND HEMATOCRIT, BLOOD
HCT: 21.2 % — ABNORMAL LOW (ref 39.0–52.0)
HCT: 29.7 % — ABNORMAL LOW (ref 39.0–52.0)
Hemoglobin: 6.6 g/dL — CL (ref 13.0–17.0)
Hemoglobin: 9.5 g/dL — ABNORMAL LOW (ref 13.0–17.0)

## 2020-05-18 LAB — GLUCOSE, CAPILLARY
Glucose-Capillary: 95 mg/dL (ref 70–99)
Glucose-Capillary: 135 mg/dL — ABNORMAL HIGH (ref 70–99)
Glucose-Capillary: 111 mg/dL — ABNORMAL HIGH (ref 70–99)

## 2020-05-18 LAB — PREPARE RBC (CROSSMATCH)

## 2020-05-18 MED ORDER — TORSEMIDE 20 MG PO TABS
40.0000 mg | ORAL_TABLET | Freq: Every day | ORAL | Status: DC
  Administered 2020-05-18 – 2020-05-20 (×3): 40 mg via ORAL
  Filled 2020-05-18 (×3): qty 2

## 2020-05-18 MED ORDER — SODIUM CHLORIDE 0.9% IV SOLUTION: Freq: Once | INTRAVENOUS | Status: AC

## 2020-05-18 MED ORDER — PANTOPRAZOLE SODIUM 40 MG PO TBEC
40.0000 mg | DELAYED_RELEASE_TABLET | Freq: Two times a day (BID) | ORAL | Status: DC
  Administered 2020-05-18 – 2020-05-22 (×8): 40 mg via ORAL
  Filled 2020-05-18 (×8): qty 1

## 2020-05-18 NOTE — Progress Notes (Addendum)
PROGRESS NOTE    [name]  JME:[id number] DOB: May 19, 1941 DOA: 05/15/2020 PCP: [name], MD    Chief Complaint  Patient presents with  . Chest Pain  . abdominal distention  . 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  Has had intermittent shortness of breath, fatigue.  Denies any abdominal pain, fevers, nausea, vomiting.  States his stool has been black with some minimal bright red blood.  He states that this a.m. he took 2 aspirin to his chest discomfort which he described as a brick on his chest.  He has not been taking his Coumadin and does not know when his last dose was.  Has been taking his other medications. Of note, patient had an EGD this past October which showed erythematous mucosa in the gastric body and antrum as well as nonbleeding gastric ulcers with clean ulcer base.  ED Course: Hemodynamically stable on room air.  Notable labs: BUN 96 (baseline 40), creatinine 2.11 (at baseline), BNP 2167 (chronically elevated), troponin 37, Hb 5.8, platelets 195, FOBT positive, INR 1.1.  EKG appears at baseline with PACs.  CXR unremarkable.  2 units PRBCs ordered by ED physician and Protonix drip started.  ED consulted Dr. Benson Norway, GI, who will see the patient.  Subjective:  Reports had one dark/black  bm after returned from EGD, no bm since However, hgb drip down to 6.6 this am, two units prbc ordered He denies pain, no sob    Assessment & Plan:   Principal Problem:   Acute GI bleeding Active Problems:   Hyperlipidemia    Essential hypertension   Anemia of chronic disease   CKD (chronic kidney disease), stage IV (HCC)   Chest pain   Chronic systolic CHF (congestive heart failure) (HCC)   Symptomatic anemia   Multiple gastric ulcers  Symptomatic acute blood loss anemia secondary to GI bleed, acute on chronic anemia, baseline hemoglobin range from 7-8 per chart review -hgb on presentation is 5.8 -Received PRBC x2 units, hgb increased to 8.4 post transfusion -Status post EGD on September 17 showed "non-bleeding gastric ulcers with no stigmata of bleeding. Biopsied.  A single non-bleeding angiodysplastic lesion in the duodenum. Treated with a monopolar probe" -Protonix drip x72hrs, transition to PPI twice daily today , patient will need PPI twice daily for a month then daily indefinitely, avoid alcohol -hold asa, gi to decide on when to resume asa -pathology pending, gi to follow up result -bun coming down from 96 to 71, no bm last 24hrs,  But hgb drifted down to 6.6  This am, 2prbc ordered, message sent to GI through secure chat   Addendum: per GI Dr Silverio Decamp dropping hgb likely from equlibriation, agrees with supportive transfusion, recommend continue monitor hgb q12hrs, if hgb continue to drop, will likely need repeat EGD, Dr Silverio Decamp will notify GI Dr Mann/[facility] will follow up on Monday.  Chronic systolic CHF, he has not been following cardiology, there are several no shows and cancelled appointment -Last EF less than 20% -Home meds Coreg and torsemide held initially due to GI  bleed,  -she received IV Lopressor while npo, now back on home med coreg -torsemide held since admission,  resume on 9/19   History of LV thrombus  -diagnosed in October 2020 echo showed "Small, fixed thrombus on the apical wall of the left ventricle" -Was on Coumadin, report currently not taking, reports run out several months -INR 1.1 on presentation -Per chart review he has multiple no-show and canceled appointment with  cardiology, he reports not able to afford the copay  Chronic bilateral lower extremity wound lymphedema Wound care consulted  CKD 3B/CKD 4 Creatinine appears at baseline Renal dosing meds   BPH with chronic indwelling catheter, reports get changed one a month at urology's office Last changed Tuesday on 9/8  History of alcohol use Advise avoid alcohol  DVT prophylaxis: SCDs Start: 05/15/20 1333   Code Status: Full Family Communication: Patient Disposition:   Status is: Inpatient  Dispo: The patient is from: home, live alone              Anticipated d/c is to: home              Anticipated d/c date is: not ready to discharge due to dropping hgb, message sent to  gi                 Consultants:   GI on 9/17   Procedures:   EGD  PRBC transfusionx4  Antimicrobials:   None     Objective: Vitals:   05/18/20 0007 05/18/20 0200 05/18/20 0534 05/18/20 0604  BP: (!) 102/59 (!) 106/55 117/63   Pulse: 77 77 78   Resp: 16  18   Temp: 98.2 F (36.8 C) 98.2 F (36.8 C) 98 F (36.7 C)   TempSrc: Oral Oral Oral   SpO2: 100% 100% 100%   Weight:    84.5 kg  Height:        Intake/Output Summary (Last 24 hours) at 05/18/2020 0904 Last data filed at 05/18/2020 3154 Gross per 24 hour  Intake 327.83 ml  Output 750 ml  Net -422.17 ml   Filed Weights   05/16/20 0708 05/17/20 0500 05/18/20 0604  Weight: 84.5 kg 84 kg 84.5 kg    Examination:  General exam: calm, NAD, chronic indwelling urinary catheter  Respiratory system: Clear to auscultation. Respiratory effort normal. Cardiovascular system: S1 & S2 heard, RRR. No JVD, no murmur, No pedal edema. Gastrointestinal system: protuberance ab, with small umbilical hernia , soft and nontender.  Normal bowel sounds heard. Central nervous system: Alert and oriented. No focal neurological deficits. Extremities: Symmetric 5 x 5 power. Skin: No rashes, lesions or ulcers Psychiatry: Judgement and insight appear normal. Mood &  affect appropriate.     Data Reviewed: I have personally reviewed following labs and imaging studies  CBC: Recent Labs  Lab 05/15/20 1020 05/15/20 2330 05/16/20 0555 05/16/20 0555 05/16/20 1319 05/16/20 1705 05/17/20 1115 05/17/20 1647 05/18/20 0538  WBC 5.5  --  5.4  --   --   --   --   --   --   NEUTROABS 4.7  --   --   --   --   --   --   --   --   HGB 5.8*   < > 7.2*   < > 8.4* 7.9* 7.0* 7.3* 6.6*  HCT 18.6*   < > 22.3*   < > 26.7* 25.6* 22.1* 23.8* 21.2*  MCV 95.4  --  91.0  --   --   --   --   --   --  PLT 195  --  176  --   --   --   --   --   --    < > = values in this interval not displayed.    Basic Metabolic Panel: Recent Labs  Lab 05/15/20 1020 05/16/20 0555 05/17/20 1115  NA 136 138 137  K 5.0 4.9 4.6  CL 106 106 109  CO2 22 21* 20*  GLUCOSE 105* 96 107*  BUN 96* 88* 71*  CREATININE 2.11* 2.07* 2.09*  CALCIUM 8.4* 9.0 8.5*  MG  --   --  2.3    GFR: Estimated Creatinine Clearance: 32 mL/min (A) (by C-G formula based on SCr of 2.09 mg/dL (H)).  Liver Function Tests: Recent Labs  Lab 05/15/20 1020  AST 14*  ALT 9  ALKPHOS 59  BILITOT 1.0  PROT 7.4  ALBUMIN 2.9*    CBG: Recent Labs  Lab 05/15/20 1701 05/16/20 0858 05/17/20 0803 05/17/20 1638 05/18/20 0738  GLUCAP 78 85 107* 107* 95     Recent Results (from the past 240 hour(s))  SARS Coronavirus 2 by RT PCR (hospital order, performed in [facility] hospital lab) Nasopharyngeal Nasopharyngeal Swab     Status: None   Collection Time: 05/15/20  9:44 AM   Specimen: Nasopharyngeal Swab  Result Value Ref Range Status   SARS Coronavirus 2 NEGATIVE NEGATIVE Final    Comment: (NOTE) SARS-CoV-2 target nucleic acids are NOT DETECTED.  The SARS-CoV-2 RNA is generally detectable in upper and lower respiratory specimens during the acute phase of infection. The lowest concentration of SARS-CoV-2 viral copies this assay can detect is 250 copies / mL. A negative result does not preclude  SARS-CoV-2 infection and should not be used as the sole basis for treatment or other patient management decisions.  A negative result may occur with improper specimen collection / handling, submission of specimen other than nasopharyngeal swab, presence of viral mutation(s) within the areas targeted by this assay, and inadequate number of viral copies (<250 copies / mL). A negative result must be combined with clinical observations, patient history, and epidemiological information.  Fact Sheet for Patients:   StrictlyIdeas.no  Fact Sheet for Healthcare Providers: BankingDealers.co.za  This test is not yet approved or  cleared by the [place] FDA and has been authorized for detection and/or diagnosis of SARS-CoV-2 by FDA under an Emergency Use Authorization (EUA).  This EUA will remain in effect (meaning this test can be used) for the duration of the COVID-19 declaration under Section 564(b)(1) of the Act, 21 U.S.C. section 360bbb-3(b)(1), unless the authorization is terminated or revoked sooner.  Performed at [facility], [place] [address] [place], [place] [address]   MRSA PCR Screening     Status: None   Collection Time: 05/15/20  4:48 PM   Specimen: Nasopharyngeal  Result Value Ref Range Status   MRSA by PCR NEGATIVE NEGATIVE Final    Comment:        The GeneXpert MRSA Assay (FDA approved for NASAL specimens only), is one component of a comprehensive MRSA colonization surveillance program. It is not intended to diagnose MRSA infection nor to guide or monitor treatment for MRSA infections. Performed at [facility], [place] [address], [place], [place] [address]          Radiology Studies: No results found.      Scheduled Meds: . sodium chloride   Intravenous Once  . carvedilol  3.125 mg Oral BID  . Chlorhexidine Gluconate Cloth  6 each Topical Daily  . mouth rinse  15 mL  Mouth Rinse BID  . sodium chloride flush  10-40 mL Intracatheter Q12H  . sodium chloride flush  3 mL Intravenous Q12H   Continuous Infusions: . pantoprozole (PROTONIX) infusion 8 mg/hr (05/18/20 0546)     LOS: 3 days   Time spent: 63mins Greater than 50% of this time was spent in counseling, explanation of diagnosis, planning of further management, and coordination of care.  I have personally reviewed and interpreted on  05/18/2020 daily labs, tele strips, imagings as discussed above under date review session and assessment and plans.  I reviewed all nursing notes, pharmacy notes, consultant notes,  vitals, pertinent old records  I have discussed plan of care as described above with RN , patient and family on 05/18/2020  Voice Recognition /Dragon dictation system was used to create this note, attempts have been made to correct errors. Please contact the author with questions and/or clarifications.   Florencia Reasons, MD PhD FACP [facility]  Available via Epic secure chat 7am-7pm for nonurgent issues Please page for urgent issues To page the attending provider between 7A-7P or the covering provider during after hours 7P-7A, please log into the web site [email or web address] and access using universal [place] password for that web site. If you do not have the password, please call the hospital operator.    05/18/2020, 9:04 AM

## 2020-05-18 NOTE — Progress Notes (Addendum)
Physical Therapy Treatment Patient Details Name: [name] MRN: [id number] DOB: 13-Jan-1941 Today's Date: 05/18/2020    History of Present Illness 79 yo male admitted with GI bleed, anemia. Hx of HF, CKD, lymphedema, DM, ETOH abuse, LE wounds, PVD, BPH-chronic indwelling catheter    PT Comments    Pt c/o pain in both feet with ambulation on today. He did not c/o lightheadedness during session. He is scheduled to receive to a blood transfusion on today. Will plan to continue following and progress activity as tolerated. Pt stated he plans to d/c home and that his son (who is visiting) can assist as needed.     Follow Up Recommendations  Home health PT;Supervision/Assistance - 24 hour     Equipment Recommendations  None recommended by PT    Recommendations for Other Services       Precautions / Restrictions Precautions Precautions: Fall Precaution Comments: Unna boots Restrictions Weight Bearing Restrictions: No    Mobility  Bed Mobility                  Transfers Overall transfer level: Needs assistance Equipment used: Straight cane Transfers: Sit to/from Stand Sit to Stand: Min guard         General transfer comment: Min guard assist for safety.  Ambulation/Gait Ambulation/Gait assistance: Min guard Gait Distance (Feet): 45 Feet Assistive device: IV Pole;Straight cane Gait Pattern/deviations: Step-through pattern;Decreased stride length;Antalgic     General Gait Details: Slow gait speed. Gait was antalgic 2* pain in both feet. Pt denied lightheadedness.   Stairs             Wheelchair Mobility    Modified Rankin (Stroke Patients Only)       Balance Overall balance assessment: Needs assistance           Standing balance-Leahy Scale: Poor                              Cognition Arousal/Alertness: Awake/alert Behavior During Therapy: WFL for tasks assessed/performed Overall Cognitive Status: Within Functional  Limits for tasks assessed                                        Exercises      General Comments        Pertinent Vitals/Pain Pain Assessment: No/denies pain    Home Living                      Prior Function            PT Goals (current goals can now be found in the care plan section) Progress towards PT goals: Progressing toward goals    Frequency    Min 3X/week      PT Plan Current plan remains appropriate    Co-evaluation              AM-PAC PT "6 Clicks" Mobility   Outcome Measure  Help needed turning from your back to your side while in a flat bed without using bedrails?: A Little Help needed moving from lying on your back to sitting on the side of a flat bed without using bedrails?: A Little Help needed moving to and from a bed to a chair (including a wheelchair)?: A Little Help needed standing up from a chair using your arms (e.g., wheelchair or bedside  chair)?: A Little Help needed to walk in hospital room?: A Little Help needed climbing 3-5 steps with a railing? : A Lot 6 Click Score: 17    End of Session Equipment Utilized During Treatment: Gait belt;Oxygen Activity Tolerance: Patient limited by fatigue Patient left: in bed;with call bell/phone within reach   PT Visit Diagnosis: Muscle weakness (generalized) (M62.81);Difficulty in walking, not elsewhere classified (R26.2)     Time: 3794-4461 PT Time Calculation (min) (ACUTE ONLY): 13 min  Charges:   Gait Training: 1 unit                       Doreatha Massed, PT Acute Rehabilitation  Office: [phone] Pager: [phone]

## 2020-05-18 NOTE — Progress Notes (Signed)
CRITICAL VALUE ALERT  Critical Value:  Hgb 6.6  Date & Time Notied:  05/18/20 @ 0642  Provider Notified: NP Blount  Orders Received/Actions taken: Awaiting response

## 2020-05-18 NOTE — Plan of Care (Signed)
Patient with one stool on 7 a to 7 p shift that he described as "still black".  No bright red blood per patient.  Patient medicated with Tylenol x 1 for bilateral leg pain.  Received 2 units of PRBCs on this shift.

## 2020-05-19 ENCOUNTER — Other Ambulatory Visit: Payer: Self-pay

## 2020-05-19 LAB — CBC
HCT: 27.8 % — ABNORMAL LOW (ref 39.0–52.0)
Hemoglobin: 8.8 g/dL — ABNORMAL LOW (ref 13.0–17.0)
MCH: 29.3 pg (ref 26.0–34.0)
MCHC: 31.7 g/dL (ref 30.0–36.0)
MCV: 92.7 fL (ref 80.0–100.0)
Platelets: 228 10*3/uL (ref 150–400)
RBC: 3 MIL/uL — ABNORMAL LOW (ref 4.22–5.81)
RDW: 18.3 % — ABNORMAL HIGH (ref 11.5–15.5)
WBC: 5.1 10*3/uL (ref 4.0–10.5)
nRBC: 0.4 % — ABNORMAL HIGH (ref 0.0–0.2)

## 2020-05-19 LAB — TYPE AND SCREEN
ABO/RH(D): A POS
Antibody Screen: NEGATIVE
Unit division: 0
Unit division: 0

## 2020-05-19 LAB — COMPREHENSIVE METABOLIC PANEL
ALT: 13 U/L (ref 0–44)
AST: 20 U/L (ref 15–41)
Albumin: 3.1 g/dL — ABNORMAL LOW (ref 3.5–5.0)
Alkaline Phosphatase: 64 U/L (ref 38–126)
Anion gap: 8 (ref 5–15)
BUN: 67 mg/dL — ABNORMAL HIGH (ref 8–23)
CO2: 24 mmol/L (ref 22–32)
Calcium: 8.4 mg/dL — ABNORMAL LOW (ref 8.9–10.3)
Chloride: 107 mmol/L (ref 98–111)
Creatinine, Ser: 2.6 mg/dL — ABNORMAL HIGH (ref 0.61–1.24)
GFR calc Af Amer: 27 mL/min — ABNORMAL LOW (ref >60)
GFR calc non Af Amer: 23 mL/min — ABNORMAL LOW (ref >60)
Glucose, Bld: 96 mg/dL (ref 70–99)
Potassium: 4.4 mmol/L (ref 3.5–5.1)
Sodium: 139 mmol/L (ref 135–145)
Total Bilirubin: 1.6 mg/dL — ABNORMAL HIGH (ref 0.3–1.2)
Total Protein: 7.7 g/dL (ref 6.5–8.1)

## 2020-05-19 LAB — BPAM RBC
Blood Product Expiration Date: 202109202359
Blood Product Expiration Date: 202109222359
ISSUE DATE / TIME: 202109191103
ISSUE DATE / TIME: 202109191354
Unit Type and Rh: 6200
Unit Type and Rh: 6200

## 2020-05-19 LAB — HEMOGLOBIN AND HEMATOCRIT, BLOOD
HCT: 27.5 % — ABNORMAL LOW (ref 39.0–52.0)
HCT: 31 % — ABNORMAL LOW (ref 39.0–52.0)
Hemoglobin: 8.7 g/dL — ABNORMAL LOW (ref 13.0–17.0)
Hemoglobin: 9.4 g/dL — ABNORMAL LOW (ref 13.0–17.0)

## 2020-05-19 LAB — GLUCOSE, CAPILLARY
Glucose-Capillary: 83 mg/dL (ref 70–99)
Glucose-Capillary: 114 mg/dL — ABNORMAL HIGH (ref 70–99)

## 2020-05-19 LAB — SURGICAL PATHOLOGY

## 2020-05-19 MED ORDER — SODIUM CHLORIDE 0.9 % IV SOLN: INTRAVENOUS | Status: AC

## 2020-05-19 NOTE — Plan of Care (Signed)
  Problem: Health Behavior/Discharge Planning: Goal: Ability to manage health-related needs will improve Outcome: Progressing   Problem: Clinical Measurements: Goal: Ability to maintain clinical measurements within normal limits will improve Outcome: Progressing Goal: Will remain free from infection Outcome: Progressing Goal: Diagnostic test results will improve Outcome: Progressing Goal: Respiratory complications will improve Outcome: Adequate for Discharge Goal: Cardiovascular complication will be avoided Outcome: Progressing   Problem: Activity: Goal: Risk for activity intolerance will decrease Outcome: Progressing   Problem: Nutrition: Goal: Adequate nutrition will be maintained Outcome: Progressing   Problem: Coping: Goal: Level of anxiety will decrease Outcome: Progressing   Problem: Elimination: Goal: Will not experience complications related to bowel motility Outcome: Progressing   Problem: Pain Managment: Goal: General experience of comfort will improve Outcome: Progressing   Problem: Safety: Goal: Ability to remain free from injury will improve Outcome: Progressing   Problem: Skin Integrity: Goal: Risk for impaired skin integrity will decrease Outcome: Progressing

## 2020-05-19 NOTE — Progress Notes (Addendum)
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 Has been taking his other medications. Of note, patient had an EGD this past October which showed erythematous mucosa in the gastric body and antrum as well as nonbleeding gastric ulcers with clean ulcer base. ED Course: Afebrile,Notable labs: BUN 96 (baseline 40), creatinine 2.11 (at baseline), BNP 2167(chronically elevated), troponin 37, Hb 5.8, platelets 195, FOBT positive, INR 1.1.EKG appears at baseline with PACs. CXR unremarkable. 2 units PRBCs ordered by ED physician and Protonix drip started. ED consulted Dr. Benson Norway, GI. Hospital course: Patient admitted to [facility] with IV PPI and GI consultation.  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  He received Protonix drip x72 hours  Subjective:  Patient resting comfortably.  States his stools are beginning to turn brown. He is off Protonix drip.  Tolerated blood transfusion well yesterday.  Objective: Vitals:   05/18/20 1602 05/18/20 2203 05/19/20 0407 05/19/20 1344  BP: 137/79 119/66 103/60 (!) 108/56  Pulse: 78 81 81 72  Resp:  20 (!) 21 17  Temp: 97.8 F (36.6 C) 97.9 F (36.6 C) 97.7 F (36.5 C) (!) 97.5 F (36.4 C)  TempSrc: Oral Oral Oral Oral  SpO2: 100% 100%   100%  Weight:   85.1 kg   Height:        Intake/Output Summary (Last 24 hours) at 05/19/2020 1713 Last data filed at 05/19/2020 1350 Gross per 24 hour  Intake 243 ml  Output 1500 ml  Net -1257 ml   Filed Weights   05/17/20 0500 05/18/20 0604 05/19/20 0407  Weight: 84 kg 84.5 kg 85.1 kg    Physical Examination:  General: Moderately built, no acute distress noted Head ENT: Atraumatic normocephalic, PERRLA, neck supple Heart: S1-S2 heard, regular rate and rhythm, no murmurs.  No leg edema noted Lungs: Equal air entry bilaterally, no rhonchi or rales on exam, no accessory muscle use Abdomen: Bowel sounds heard, soft, nontender, nondistended. No organomegaly.  No CVA tenderness Extremities: Chronic venous edema with Unna boots in place.   Neurological: Awake alert oriented x3, no focal weakness or numbness, strength and sensations to crude touch intact Skin: Bilateral lower extremity wounds wrapped in Unna boots  Data Reviewed: I have personally reviewed following labs and imaging studies  CBC: Recent Labs  Lab 05/15/20 1020 05/15/20 2330 05/16/20 0555 05/16/20 1319 05/17/20 1647 05/18/20 0538 05/18/20 1754 05/19/20 0408 05/19/20 1339  WBC 5.5  --  5.4  --   --   --   --  5.1  --   NEUTROABS 4.7  --   --   --   --   --   --   --   --   HGB 5.8*   < > 7.2*   < > 7.3* 6.6* 9.5* 8.7*  8.8* 9.4*  HCT 18.6*   < > 22.3*   < >  23.8* 21.2* 29.7* 27.5*  27.8* 31.0*  MCV 95.4  --  91.0  --   --   --   --  92.7  --   PLT 195  --  176  --   --   --   --  228  --    < > = values in this interval not displayed.   Basic Metabolic Panel: Recent Labs  Lab 05/15/20 1020 05/16/20 0555 05/17/20 1115 05/19/20 0408  NA 136 138 137 139  K 5.0 4.9 4.6 4.4  CL 106 106 109 107  CO2 22 21* 20* 24  GLUCOSE 105* 96 107* 96  BUN 96* 88* 71* 67*  CREATININE 2.11* 2.07* 2.09* 2.60*  CALCIUM 8.4* 9.0 8.5* 8.4*  MG  --   --  2.3  --    GFR: Estimated Creatinine Clearance: 25.7 mL/min (A)  (by C-G formula based on SCr of 2.6 mg/dL (H)). Liver Function Tests: Recent Labs  Lab 05/15/20 1020 05/19/20 0408  AST 14* 20  ALT 9 13  ALKPHOS 59 64  BILITOT 1.0 1.6*  PROT 7.4 7.7  ALBUMIN 2.9* 3.1*   Recent Labs  Lab 05/15/20 1020  LIPASE 26   No results for input(s): AMMONIA in the last 168 hours. Coagulation Profile: Recent Labs  Lab 05/15/20 1020  INR 1.1   Cardiac Enzymes: No results for input(s): CKTOTAL, CKMB, CKMBINDEX, TROPONINI in the last 168 hours. BNP (last 3 results) No results for input(s): PROBNP in the last 8760 hours. HbA1C: No results for input(s): HGBA1C in the last 72 hours. CBG: Recent Labs  Lab 05/18/20 0738 05/18/20 1209 05/18/20 1712 05/19/20 0847 05/19/20 1557  GLUCAP 95 135* 111* 83 114*   Lipid Profile: No results for input(s): CHOL, HDL, LDLCALC, TRIG, CHOLHDL, LDLDIRECT in the last 72 hours. Thyroid Function Tests: No results for input(s): TSH, T4TOTAL, FREET4, T3FREE, THYROIDAB in the last 72 hours. Anemia Panel: No results for input(s): VITAMINB12, FOLATE, FERRITIN, TIBC, IRON, RETICCTPCT in the last 72 hours. Sepsis Labs: No results for input(s): PROCALCITON, LATICACIDVEN in the last 168 hours.  Recent Results (from the past 240 hour(s))  SARS Coronavirus 2 by RT PCR (hospital order, performed in [facility] hospital lab) Nasopharyngeal Nasopharyngeal Swab     Status: None   Collection Time: 05/15/20  9:44 AM   Specimen: Nasopharyngeal Swab  Result Value Ref Range Status   SARS Coronavirus 2 NEGATIVE NEGATIVE Final    Comment: (NOTE) SARS-CoV-2 target nucleic acids are NOT DETECTED.  The SARS-CoV-2 RNA is generally detectable in upper and lower respiratory specimens during the acute phase of infection. The lowest concentration of SARS-CoV-2 viral copies this assay can detect is 250 copies / mL. A negative result does not preclude SARS-CoV-2 infection and should not be used as the sole basis for treatment or  other patient management decisions.  A negative result may occur with improper specimen collection / handling, submission of specimen other than nasopharyngeal swab, presence of viral mutation(s) within the areas targeted by this assay, and inadequate number of viral copies (<250 copies / mL). A negative result must be combined with clinical observations, patient history, and epidemiological information.  Fact Sheet for Patients:   StrictlyIdeas.no  Fact Sheet for Healthcare Providers: BankingDealers.co.za  This test is not yet approved or  cleared by the [place] FDA and has been authorized for detection and/or diagnosis of SARS-CoV-2 by FDA under an Emergency Use Authorization (EUA).  This EUA will remain in effect (  meaning this test can be used) for the duration of the COVID-19 declaration under Section 564(b)(1) of the Act, 21 U.S.C. section 360bbb-3(b)(1), unless the authorization is terminated or revoked sooner.  Performed at [facility], [place] [address] [place], [place] [address]   MRSA PCR Screening     Status: None   Collection Time: 05/15/20  4:48 PM   Specimen: Nasopharyngeal  Result Value Ref Range Status   MRSA by PCR NEGATIVE NEGATIVE Final    Comment:        The GeneXpert MRSA Assay (FDA approved for NASAL specimens only), is one component of a comprehensive MRSA colonization surveillance program. It is not intended to diagnose MRSA infection nor to guide or monitor treatment for MRSA infections. Performed at [facility], [place] [address] [place], [place] [address]       Radiology Studies: No results found.    Scheduled Meds: . carvedilol  3.125 mg Oral BID  . Chlorhexidine Gluconate Cloth  6 each Topical Daily  . mouth rinse  15 mL Mouth Rinse BID  . pantoprazole  40 mg Oral BID AC  . sodium chloride flush  10-40 mL Intracatheter Q12H  . sodium chloride  flush  3 mL Intravenous Q12H  . torsemide  40 mg Oral Daily   Continuous Infusions:    Assessment/Plan:  1.  Upper GI bleed: S/p EGD on 9/17 which showed non-bleeding gastric ulcers with no stigmata of bleeding. Biopsied.  A single non-bleeding angiodysplastic lesion in the duodenum. Treated with a monopolar probe" Protonix drip x72hrs, transition to PPI twice daily , patient will need PPI twice daily for a month then daily indefinitely, avoid alcohol".  Patient already on PPI twice daily at baseline, will continue the same upon discharge.  Recommend follow-up GI as outpatient.  Page GI this morning regarding clearance for resuming antiplatelet agents/anticoagulation.  GI stated okay to resume both as needed.  Paged cardiology regarding need to resume anticoagulation.  2.  Acute blood loss anemia: In the setting of problem #1.  Patient received 2 units PRBC as presenting hemoglobin was 5.8.  Posttransfusion hemoglobin improved to 8.4.  After EGD, hemoglobin again drifted down to 6.6 and received another 2 units of PRBC on 9/19.  Posttransfusion hemoglobin today stable around 8.5-9.  Patient also reports that his stools are beginning to turn brown.  Cleared by GI if for discharge with plan as above.  3.  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 Rayann Heman) previously.  Patient states he stopped taking Coumadin couple of months back as he has been unable to afford follow-up visits or Coumadin checks.  He states he is trying to clear PCP co-pay dues this month and plans to set up cardiology visit next month when he can afford $21 co-pay.  I called and discussed with Dr. Percival Spanish who is covering for [facility] today regarding need to resume anticoagulation especially given recent noncompliance and current admission with GI bleed--he advised to hold off on Coumadin until outpatient follow-up and further discussion.  Will resume baby aspirin as discussed  with cardiology and GI.  Diuretics on hold as inpatient.  4.  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  Today elevated at 2.6.  He is not on any diuretics or ACE inhibitors.  Will give ginger hydration and repeat labs in AM.  He is on torsemide 40 mg daily at baseline for  problem #3..  5. BPH with chronic indwelling Foley catheter: Patient undergoes monthly changes through urology office and last changed on 9/8.  Resume tamsulosin  6.  Chronic lower extremity venous stasis/wounds: Follows wound care clinic and has Unna boot changes every week.  Also has home health wound care coming once a week.  DVT prophylaxis: On Unna boots, SCDs Code Status: Full code Family / Patient Communication: Discussed with patient Disposition Plan:   Status is: Inpatient  Remains inpatient appropriate because:IV treatments appropriate due to intensity of illness or inability to take PO   Dispo: The patient is from: Home              Anticipated d/c is to: Home with home PT (patient feels he does not need it), patient also needs a ride to be set up in AM.              Anticipated d/c date is: 1 day if AKI improves.              Patient currently is not medically stable to d/c.     Time spent: 35 MIN     >50% time spent in discussions with care team and coordination of care.    Guilford Shi, MD [facility] Pager in [place]  If 7PM-7AM, please contact night-coverage [email or web address] 05/19/2020, 5:13 PM

## 2020-05-19 NOTE — Care Management Important Message (Signed)
Important Message  Patient Details  Name: [name] MRN: [id number] Date of Birth: Feb 15, 1941   Medicare Important Message Given:  Yes (Medicare IM printed for Evette Cristal, to give to the patient)     [name] 05/19/2020, 11:00 AM

## 2020-05-19 NOTE — Consult Note (Signed)
[place] Nurse wound follow up Wound type: Nonhealing venous wounds to bilateral lower legs with application of PRofore twice weekly.  Measurement:Left leg:  2 lesions proximal  0.5 cm round  Distal:  1 cm round with depth of 0.2 cm  Right leg:  2 lesions Proximal:  1 cm x 1 cm x 0.2 cm  Distal: 1 cm x 1.5 cm x 0.4 cm  Wound WPV:XYIAX red Drainage (amount, consistency, odor) minimal serosanguinous  No odor.  Periwound:chronic skin changes  edema Dressing procedure/placement/frequency: Cleanse lower legs with soap and water and pat dry apply aquacel Ag to open wounds.  Cover with ABD pad.  Wrap both legs in 4 layer compression bandages.  Change Monday and Thursday.  Will follow.  Domenic Moras MSN, RN, FNP-BC CWON Wound, Ostomy, Continence Nurse Pager [phone]

## 2020-05-20 ENCOUNTER — Encounter (HOSPITAL_COMMUNITY): Payer: Self-pay | Admitting: Gastroenterology

## 2020-05-20 LAB — HEMOGLOBIN AND HEMATOCRIT, BLOOD
HCT: 26.4 % — ABNORMAL LOW (ref 39.0–52.0)
HCT: 30.2 % — ABNORMAL LOW (ref 39.0–52.0)
Hemoglobin: 8.3 g/dL — ABNORMAL LOW (ref 13.0–17.0)
Hemoglobin: 9.5 g/dL — ABNORMAL LOW (ref 13.0–17.0)

## 2020-05-20 LAB — GLUCOSE, CAPILLARY
Glucose-Capillary: 105 mg/dL — ABNORMAL HIGH (ref 70–99)
Glucose-Capillary: 93 mg/dL (ref 70–99)
Glucose-Capillary: 100 mg/dL — ABNORMAL HIGH (ref 70–99)
Glucose-Capillary: 91 mg/dL (ref 70–99)

## 2020-05-20 LAB — BASIC METABOLIC PANEL
Anion gap: 9 (ref 5–15)
BUN: 63 mg/dL — ABNORMAL HIGH (ref 8–23)
CO2: 24 mmol/L (ref 22–32)
Calcium: 8.3 mg/dL — ABNORMAL LOW (ref 8.9–10.3)
Chloride: 106 mmol/L (ref 98–111)
Creatinine, Ser: 2.54 mg/dL — ABNORMAL HIGH (ref 0.61–1.24)
GFR calc Af Amer: 27 mL/min — ABNORMAL LOW (ref >60)
GFR calc non Af Amer: 24 mL/min — ABNORMAL LOW (ref >60)
Glucose, Bld: 115 mg/dL — ABNORMAL HIGH (ref 70–99)
Potassium: 4.2 mmol/L (ref 3.5–5.1)
Sodium: 139 mmol/L (ref 135–145)

## 2020-05-20 MED ORDER — ASPIRIN EC 81 MG PO TBEC
81.0000 mg | DELAYED_RELEASE_TABLET | Freq: Every day | ORAL | Status: DC
  Administered 2020-05-20 – 2020-05-22 (×3): 81 mg via ORAL
  Filled 2020-05-20 (×3): qty 1

## 2020-05-20 NOTE — Progress Notes (Signed)
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 Has been taking his other medications. Of note, patient had an EGD this past October which showed erythematous mucosa in the gastric body and antrum as well as nonbleeding gastric ulcers with clean ulcer base. ED Course: Afebrile,Notable labs: BUN 96 (baseline 40), creatinine 2.11 (at baseline), BNP 2167(chronically elevated), troponin 37, Hb 5.8, platelets 195, FOBT positive, INR 1.1.EKG appears at baseline with PACs. CXR unremarkable. 2 units PRBCs ordered by ED physician and Protonix drip started. ED consulted Dr. Benson Norway, GI. Hospital course: Patient admitted to [facility] with IV PPI and GI consultation.  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  He received Protonix drip x72 hours  Subjective:  Patient sitting in bedside chair, complains of feeling weak.  His stools are beginning to turn brown but last bowel movement yesterday and none today. He is off Protonix drip.  Receiving ginger hydration for AKI and tolerating well.  Objective: Vitals:   05/19/20 2212 05/19/20 2212 05/20/20 0415 05/20/20 1334  BP: 106/60 106/60 (!) 114/58 (!) 108/59  Pulse: 78 78 74 73  Resp:  20 18 18   Temp:  98.8 F (37.1 C) 98.2 F (36.8 C)  (!) 97.4 F (36.3 C)  TempSrc:  Oral Oral Oral  SpO2:  100% 100% 100%  Weight:   83.1 kg   Height:        Intake/Output Summary (Last 24 hours) at 05/20/2020 1611 Last data filed at 05/20/2020 1300 Gross per 24 hour  Intake 2343 ml  Output 1750 ml  Net 593 ml   Filed Weights   05/18/20 0604 05/19/20 0407 05/20/20 0415  Weight: 84.5 kg 85.1 kg 83.1 kg    Physical Examination:  General: Moderately built, no acute distress noted Head ENT: Atraumatic normocephalic, PERRLA, neck supple Heart: S1-S2 heard, regular rate and rhythm, no murmurs.  No leg edema noted Lungs: Equal air entry bilaterally, no rhonchi or rales on exam, no accessory muscle use Abdomen: Bowel sounds heard, soft, nontender, nondistended. No organomegaly.  No CVA tenderness Extremities: Chronic venous edema with Unna boots in place.   Neurological: Awake alert oriented x3, no focal weakness or numbness, strength and sensations to crude touch intact Skin: Bilateral lower extremity wounds wrapped in Unna boots  Data Reviewed: I have personally reviewed following labs and imaging studies  CBC: Recent Labs  Lab 05/15/20 1020 05/15/20 2330 05/16/20 0555 05/16/20 1319 05/18/20 0538 05/18/20 1754 05/19/20 0408 05/19/20 1339 05/20/20 0453  WBC 5.5  --  5.4  --   --   --  5.1  --   --   NEUTROABS 4.7  --   --   --   --   --   --   --   --   HGB 5.8*   < > 7.2*   < > 6.6* 9.5*  8.7*  8.8* 9.4* 8.3*  HCT 18.6*   < > 22.3*   < > 21.2* 29.7* 27.5*  27.8* 31.0* 26.4*  MCV 95.4  --  91.0  --   --   --  92.7  --   --   PLT 195  --  176  --   --   --  228  --   --    < > = values in this interval not displayed.   Basic Metabolic Panel: Recent Labs  Lab 05/15/20 1020 05/16/20 0555 05/17/20 1115 05/19/20 0408 05/20/20 0453  NA 136 138 137 139 139  K 5.0 4.9 4.6 4.4 4.2  CL 106 106 109 107 106  CO2 22 21* 20* 24 24  GLUCOSE 105* 96 107* 96 115*  BUN 96* 88* 71* 67* 63*  CREATININE 2.11* 2.07* 2.09* 2.60*  2.54*  CALCIUM 8.4* 9.0 8.5* 8.4* 8.3*  MG  --   --  2.3  --   --    GFR: Estimated Creatinine Clearance: 26.4 mL/min (A) (by C-G formula based on SCr of 2.54 mg/dL (H)). Liver Function Tests: Recent Labs  Lab 05/15/20 1020 05/19/20 0408  AST 14* 20  ALT 9 13  ALKPHOS 59 64  BILITOT 1.0 1.6*  PROT 7.4 7.7  ALBUMIN 2.9* 3.1*   Recent Labs  Lab 05/15/20 1020  LIPASE 26   No results for input(s): AMMONIA in the last 168 hours. Coagulation Profile: Recent Labs  Lab 05/15/20 1020  INR 1.1   Cardiac Enzymes: No results for input(s): CKTOTAL, CKMB, CKMBINDEX, TROPONINI in the last 168 hours. BNP (last 3 results) No results for input(s): PROBNP in the last 8760 hours. HbA1C: No results for input(s): HGBA1C in the last 72 hours. CBG: Recent Labs  Lab 05/19/20 0847 05/19/20 1134 05/19/20 1557 05/20/20 0752 05/20/20 1111  GLUCAP 83 105* 114* 93 100*   Lipid Profile: No results for input(s): CHOL, HDL, LDLCALC, TRIG, CHOLHDL, LDLDIRECT in the last 72 hours. Thyroid Function Tests: No results for input(s): TSH, T4TOTAL, FREET4, T3FREE, THYROIDAB in the last 72 hours. Anemia Panel: No results for input(s): VITAMINB12, FOLATE, FERRITIN, TIBC, IRON, RETICCTPCT in the last 72 hours. Sepsis Labs: No results for input(s): PROCALCITON, LATICACIDVEN in the last 168 hours.  Recent Results (from the past 240 hour(s))  SARS Coronavirus 2 by RT PCR (hospital order, performed in [facility] hospital lab) Nasopharyngeal Nasopharyngeal Swab     Status: None   Collection Time: 05/15/20  9:44 AM   Specimen: Nasopharyngeal Swab  Result Value Ref Range Status   SARS Coronavirus 2 NEGATIVE NEGATIVE Final    Comment: (NOTE) SARS-CoV-2 target nucleic acids are NOT DETECTED.  The SARS-CoV-2 RNA is generally detectable in upper and lower respiratory specimens during the acute phase of infection. The lowest concentration of SARS-CoV-2 viral copies this assay can detect is 250 copies /  mL. A negative result does not preclude SARS-CoV-2 infection and should not be used as the sole basis for treatment or other patient management decisions.  A negative result may occur with improper specimen collection / handling, submission of specimen other than nasopharyngeal swab, presence of viral mutation(s) within the areas targeted by this assay, and inadequate number of viral copies (<250 copies / mL). A negative result must be combined with clinical observations, patient history, and epidemiological information.  Fact Sheet for Patients:   StrictlyIdeas.no  Fact Sheet for Healthcare Providers: BankingDealers.co.za  This test is not yet approved or  cleared  by the [place] and has been authorized for detection and/or diagnosis of SARS-CoV-2 by FDA under an Emergency Use Authorization (EUA).  This EUA will remain in effect (meaning this test can be used) for the duration of the COVID-19 declaration under Section 564(b)(1) of the Act, 21 U.S.C. section 360bbb-3(b)(1), unless the authorization is terminated or revoked sooner.  Performed at [facility], [place] [address], [place], [place] [address]   MRSA PCR Screening     Status: None   Collection Time: 05/15/20  4:48 PM   Specimen: Nasopharyngeal  Result Value Ref Range Status   MRSA by PCR NEGATIVE NEGATIVE Final    Comment:        The GeneXpert MRSA Assay (FDA approved for NASAL specimens only), is one component of a comprehensive MRSA colonization surveillance program. It is not intended to diagnose MRSA infection nor to guide or monitor treatment for MRSA infections. Performed at [facility], [place] [address], [place], [place] [address]       Radiology Studies: No results found.    Scheduled Meds: . carvedilol  3.125 mg Oral BID  . Chlorhexidine Gluconate Cloth  6 each Topical Daily  . mouth rinse  15 mL Mouth  Rinse BID  . pantoprazole  40 mg Oral BID AC  . sodium chloride flush  10-40 mL Intracatheter Q12H  . sodium chloride flush  3 mL Intravenous Q12H   Continuous Infusions:    Assessment/Plan:  1.  Upper GI bleed: S/p EGD on 9/17 which showed non-bleeding gastric ulcers with no stigmata of bleeding. Biopsied.  A single non-bleeding angiodysplastic lesion in the duodenum. Treated with a monopolar probe" Protonix drip x72hrs, transition to PPI twice daily , patient will need PPI twice daily for a month then daily indefinitely, avoid alcohol".  Patient already on PPI twice daily at baseline, will continue the same upon discharge.  Recommend follow-up GI as outpatient.  Page GI this morning regarding clearance for resuming antiplatelet agents/anticoagulation.  GI stated okay to resume both as needed.  Discussed with cardiology regarding need to resume anticoagulation-see below.  2.  Acute blood loss anemia: In the setting of problem #1.  Patient received 2 units PRBC as presenting hemoglobin was 5.8.  Posttransfusion hemoglobin improved to 8.4.  After EGD, hemoglobin again drifted down to 6.6 and received another 2 units of PRBC on 9/19.  Posttransfusion hemoglobin today yesterday around 8.5-9.  Today lower at 8.3, likely dilutional with IV fluids.  Patient reports that his stools were beginning to turn brown yesterday.  Cleared by GI if for discharge with plan as above.  Will resume baby aspirin while here and repeat H&H in a.m.  3.  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 Rayann Heman) previously.  Patient states he stopped taking Coumadin couple of months back as he has been unable to afford follow-up visits or Coumadin checks.  He states he is trying to clear PCP co-pay dues this month and plans to set up cardiology visit next month when he can afford $21 co-pay.  I called and discussed with Dr. Percival Spanish who is covering for [facility] today regarding  need to resume anticoagulation especially given recent noncompliance and current admission with GI bleed--he advised to hold off on Coumadin until outpatient follow-up and further discussion.  Will resume baby aspirin as discussed with cardiology and GI.  Diuretics on hold as inpatient.  4.  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  Was elevated at 2.6 yesterday and started on ginger hydration.  He is not on any ACE inhibitors but noted to be on torsemide--held today and will continue hydration for another day as creatinine only minimally improved at 2.5.  Will give ginger hydration and repeat labs in AM.    5. BPH with chronic indwelling Foley catheter: Patient undergoes monthly changes through urology office and last changed on 9/8.  Resume tamsulosin  6.  Chronic lower extremity venous stasis/wounds: Follows wound care clinic and has Unna boot changes every week.  Also has home health wound care coming once a week.  DVT prophylaxis: On Unna boots, SCDs Code Status: Full code Family / Patient Communication: Discussed with patient Disposition Plan:   Status is: Inpatient  Remains inpatient appropriate because:IV treatments appropriate due to intensity of illness or inability to take PO   Dispo: The patient is from: Home              Anticipated d/c is to: Home with home PT (patient feels he does not need it), patient also needs a ride to be set up in AM.              Anticipated d/c date is: 1 day if AKI improves.              Patient currently is not medically stable to d/c.     Time spent: 25 MIN     >50% time spent in discussions with care team and coordination of care.    Guilford Shi, MD [facility] Pager in [place]  If 7PM-7AM, please contact night-coverage [email or web address] 05/20/2020, 4:11 PM

## 2020-05-20 NOTE — TOC Initial Note (Signed)
Transition of Care [facility]) - Initial/Assessment Note    Patient Details  Name: [name] MRN: [id number] Date of Birth: 12-Oct-1940  Transition of [facility]) CM/SW Contact:    Ross Ludwig, LCSW Phone Number: 05/20/2020, 3:42 PM  Clinical Narrative:                  Patient is a 79 year old male who is alert and oriented x4.  Patient states he wants to continue with the hh agency.  Patient is currently active with [facility] for home health nursing.  Patient was talkative and looking forward to returning back home.  Patient had a sister who lives by whom he talks to regularly.  Patient expressed he was please with the care that he has been receiving at the hospital.  CSW to continue to follow patient's progress throughout discharge planning.   Expected Discharge Plan: [place] Barriers to Discharge: Continued Medical Work up   Patient Goals and CMS Choice Patient states their goals for this hospitalization and ongoing recovery are:: To go home with home health. CMS Medicare.gov Compare Post Acute Care list provided to:: Patient Choice offered to / list presented to : Patient  Expected Discharge Plan and Services Expected Discharge Plan: [place] In-house Referral: NA Discharge Planning Services: NA Post Acute Care Choice: [place] arrangements for the past 2 months: Single Family Home                   DME Agency: NA       HH Arranged: RN, PT, OT HH Agency: [place] Date [place]: 05/20/20 Time HH Agency Contacted: 56 Representative spoke with at [place]: [place] Arrangements/Services Living arrangements for the past 2 months: [place] with:: Self Patient language and need for interpreter reviewed:: Yes Do you feel safe going back to the place where you live?: Yes      Need for Family Participation in Patient Care: No (Comment) Care giver support  system in place?: No (comment) Current home services: Home RN Criminal Activity/Legal Involvement Pertinent to Current Situation/Hospitalization: No - Comment as needed  Activities of Daily Living Home Assistive Devices/Equipment: Cane (specify quad or straight), Walker (specify type), Other (Comment) (single point cane, 4 wheeled walker, tub/shower unit) ADL Screening (condition at time of admission) Patient's cognitive ability adequate to safely complete daily activities?: Yes Is the patient deaf or have difficulty hearing?: No Does the patient have difficulty seeing, even when wearing glasses/contacts?: No Does the patient have difficulty concentrating, remembering, or making decisions?: No Patient able to express need for assistance with ADLs?: Yes Does the patient have difficulty dressing or bathing?: Yes Independently performs ADLs?: Yes (appropriate for developmental age) Does the patient have difficulty walking or climbing stairs?: Yes (secondary to weakness) Weakness of Legs: Right Weakness of Arms/Hands: None  Permission Sought/Granted Permission sought to share information with : Case Manager, Family Supports Permission granted to share information with : Yes, Verbal Permission Granted  Share Information with NAME: [name] Sister [phone] or Charlyne Quale Niece [phone]  Permission granted to share info w AGENCY: Home health agencies        Emotional Assessment Appearance:: Appears stated age Attitude/Demeanor/Rapport: Engaged Affect (typically observed): Calm, Appropriate, Accepting, Pleasant, Stable Orientation: : Oriented to Self, Oriented to Place, Oriented to  Time, Oriented to Situation Alcohol / Substance Use: Not Applicable Psych Involvement: No (comment)  Admission diagnosis:  GI bleed [K92.2] Acute GI bleeding [K92.2] Elevated troponin [R77.8] Chest pain, unspecified type [R07.9] Patient Active Problem List   Diagnosis Date Noted  . Chronic  systolic CHF (congestive heart failure) ([place])   . Symptomatic anemia   . Multiple gastric ulcers   . CKD (chronic kidney disease), stage IV ([place]) 05/15/2020  . Chest pain 05/15/2020  . Acute GI bleeding 06/30/2019  . Dysphagia   . Palliative care encounter   . Cardiogenic shock ([place])   . Abdominal pain 06/19/2019  . Acute lower UTI 06/19/2019  . Choledocholithiasis   . Cholangitis due to bile duct calculus with obstruction   . Acute renal failure (ARF) ([place]) 02/05/2019  . Acute on chronic congestive heart failure ([place]) 02/04/2019  . Anemia of chronic disease 03/08/2018  . Acute cholecystitis 11/23/2017  . Renal lesion 11/23/2017  . Alcohol abuse 02/26/2015  . Hyperlipidemia   . Diabetes mellitus without complication ([place])   . CKD (chronic kidney disease), stage III ([place])   . AKI (acute kidney injury) ([place])   . Acute on chronic systolic and diastolic heart failure, NYHA class 1 ([place])   . Essential hypertension    PCP:  [name], MD Pharmacy:   [facility] [address] [place]), [place] - [address] [place] ([place]) [place] [id number] Phone: [phone] Fax: [phone]     Social Determinants of Health (SDOH) Interventions    Readmission Risk Interventions No flowsheet data found.

## 2020-05-20 NOTE — Progress Notes (Signed)
Physical Therapy Treatment Patient Details Name: [name] MRN: [id number] DOB: 1940-09-26 Today's Date: 05/20/2020    History of Present Illness 79 yo male admitted with GI bleed, anemia. Hx of HF, CKD, lymphedema, DM, ETOH abuse, LE wounds, PVD, BPH-chronic indwelling catheter    PT Comments    Pt progressing toward PT goals. Pleasant and cooperative. incr gait/activity tolerance today   Follow Up Recommendations  Home health PT;Supervision/Assistance - 24 hour     Equipment Recommendations  None recommended by PT    Recommendations for Other Services       Precautions / Restrictions Precautions Precautions: Fall Precaution Comments: Unna boots    Mobility  Bed Mobility Overal bed mobility: Needs Assistance Bed Mobility: Sit to Supine       Sit to supine: Min assist   General bed mobility comments: assist to fully elevate bil LEs on to bed   Transfers Overall transfer level: Needs assistance Equipment used: Straight cane Transfers: Sit to/from Stand Sit to Stand: Min guard         General transfer comment: Min guard assist for safety.  Ambulation/Gait Ambulation/Gait assistance: Min guard;Supervision Gait Distance (Feet): 80 Feet Assistive device: IV Pole;Straight cane Gait Pattern/deviations: Step-through pattern;Decreased stride length;Wide base of support     General Gait Details: Slow gait speed. mildly unsteady but without overt LOB.   Stairs             Wheelchair Mobility    Modified Rankin (Stroke Patients Only)       Balance                                            Cognition Arousal/Alertness: Awake/alert Behavior During Therapy: WFL for tasks assessed/performed Overall Cognitive Status: Within Functional Limits for tasks assessed                                        Exercises      General Comments        Pertinent Vitals/Pain Pain Assessment: No/denies pain    Home  Living                      Prior Function            PT Goals (current goals can now be found in the care plan section) Acute Rehab PT Goals Patient Stated Goal: to get better and get back home PT Goal Formulation: With patient Time For Goal Achievement: 05/31/20 Potential to Achieve Goals: Good Progress towards PT goals: Progressing toward goals    Frequency    Min 3X/week      PT Plan Current plan remains appropriate    Co-evaluation              AM-PAC PT "6 Clicks" Mobility   Outcome Measure  Help needed turning from your back to your side while in a flat bed without using bedrails?: A Little Help needed moving from lying on your back to sitting on the side of a flat bed without using bedrails?: A Little Help needed moving to and from a bed to a chair (including a wheelchair)?: A Little Help needed standing up from a chair using your arms (e.g., wheelchair or bedside chair)?: A Little Help needed to walk in  hospital room?: A Little Help needed climbing 3-5 steps with a railing? : A Lot 6 Click Score: 17    End of Session   Activity Tolerance: Patient limited by fatigue Patient left: in bed;with call bell/phone within reach;with bed alarm set   PT Visit Diagnosis: Muscle weakness (generalized) (M62.81);Difficulty in walking, not elsewhere classified (R26.2)     Time: 7185-5015 PT Time Calculation (min) (ACUTE ONLY): 18 min  Charges:  $Gait Training: 8-22 mins                     Baxter Flattery, PT  Acute Rehab Dept ([place]) [phone] Pager [phone]  05/20/2020    [facility] 05/20/2020, 2:48 PM

## 2020-05-21 LAB — BASIC METABOLIC PANEL
Anion gap: 9 (ref 5–15)
BUN: 57 mg/dL — ABNORMAL HIGH (ref 8–23)
CO2: 25 mmol/L (ref 22–32)
Calcium: 8.3 mg/dL — ABNORMAL LOW (ref 8.9–10.3)
Chloride: 106 mmol/L (ref 98–111)
Creatinine, Ser: 2.42 mg/dL — ABNORMAL HIGH (ref 0.61–1.24)
GFR calc Af Amer: 29 mL/min — ABNORMAL LOW (ref >60)
GFR calc non Af Amer: 25 mL/min — ABNORMAL LOW (ref >60)
Glucose, Bld: 86 mg/dL (ref 70–99)
Potassium: 4.1 mmol/L (ref 3.5–5.1)
Sodium: 140 mmol/L (ref 135–145)

## 2020-05-21 LAB — GLUCOSE, CAPILLARY
Glucose-Capillary: 108 mg/dL — ABNORMAL HIGH (ref 70–99)
Glucose-Capillary: 135 mg/dL — ABNORMAL HIGH (ref 70–99)
Glucose-Capillary: 85 mg/dL (ref 70–99)

## 2020-05-21 LAB — HEMOGLOBIN AND HEMATOCRIT, BLOOD
HCT: 25.9 % — ABNORMAL LOW (ref 39.0–52.0)
Hemoglobin: 8.1 g/dL — ABNORMAL LOW (ref 13.0–17.0)

## 2020-05-21 MED ORDER — SODIUM CHLORIDE 0.9 % IV SOLN: INTRAVENOUS | Status: DC

## 2020-05-21 NOTE — Progress Notes (Signed)
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 Has been taking his other medications.   EGD this past October which showed erythematous mucosa in the gastric body and antrum as well as nonbleeding gastric ulcers with clean ulcer base.  ED Course: Afebrile,Notable labs: BUN 96 (baseline 40), creatinine 2.11 (at baseline), BNP 2167(chronically elevated), troponin 37, Hb 5.8, platelets 195, FOBT positive, INR 1.1.EKG appears at baseline with PACs. CXR unremarkable. 2 units PRBCs ordered by ED physician and Protonix drip started. ED consulted Dr. Benson Norway, GI.  Hospital course: Patient admitted to [facility] with IV PPI and GI consultation.  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  He received Protonix drip x72 hours  Subjective:  Awake coherent pleasant no distress eating lunch at the bedside no chest pain  Objective: Vitals:   05/20/20 0415 05/20/20 1334 05/20/20 2054 05/21/20 0328  BP: (!) 114/58 (!) 108/59 110/60 111/60  Pulse: 74 73 82 72  Resp: 18 18 16 18   Temp: 98.2 F (36.8 C) (!) 97.4 F (36.3 C) 98.4 F (36.9 C) 98.7 F (37.1 C)  TempSrc: Oral Oral Oral Oral  SpO2: 100% 100% 100% 100%  Weight: 83.1 kg   82.9 kg  Height:        Intake/Output Summary (Last  24 hours) at 05/21/2020 1119 Last data filed at 05/21/2020 0345 Gross per 24 hour  Intake 720 ml  Output 2700 ml  Net -1980 ml   Filed Weights   05/19/20 0407 05/20/20 0415 05/21/20 0328  Weight: 85.1 kg 83.1 kg 82.9 kg    Physical Examination:  EOMI NCAT however does have some changes to his right eye Chest clear no added sound no rales no rhonchi Abdomen soft no rebound no guarding slight distention Unna boots bilaterally ROM intact to all major muscle groups without deficit  Data Reviewed: I have personally reviewed following labs and imaging studies  CBC: Recent Labs  Lab 05/15/20 1020 05/15/20 2330 05/16/20 0555 05/16/20 1319 05/19/20 0408 05/19/20 1339 05/20/20 0453 05/20/20 1705 05/21/20 0616  WBC 5.5  --  5.4  --  5.1  --   --   --   --   NEUTROABS 4.7  --   --   --   --   --   --   --   --   HGB 5.8*   < > 7.2*   < > 8.7*  8.8* 9.4* 8.3* 9.5* 8.1*  HCT 18.6*   < > 22.3*   < > 27.5*  27.8* 31.0* 26.4* 30.2* 25.9*  MCV 95.4  --  91.0  --  92.7  --   --   --   --   PLT 195  --  176  --  228  --   --   --   --    < > = values  in this interval not displayed.   Basic Metabolic Panel: Recent Labs  Lab 05/16/20 0555 05/17/20 1115 05/19/20 0408 05/20/20 0453 05/21/20 0616  NA 138 137 139 139 140  K 4.9 4.6 4.4 4.2 4.1  CL 106 109 107 106 106  CO2 21* 20* 24 24 25   GLUCOSE 96 107* 96 115* 86  BUN 88* 71* 67* 63* 57*  CREATININE 2.07* 2.09* 2.60* 2.54* 2.42*  CALCIUM 9.0 8.5* 8.4* 8.3* 8.3*  MG  --  2.3  --   --   --    GFR: Estimated Creatinine Clearance: 27.7 mL/min (A) (by C-G formula based on SCr of 2.42 mg/dL (H)). Liver Function Tests: Recent Labs  Lab 05/15/20 1020 05/19/20 0408  AST 14* 20  ALT 9 13  ALKPHOS 59 64  BILITOT 1.0 1.6*  PROT 7.4 7.7  ALBUMIN 2.9* 3.1*   Recent Labs  Lab 05/15/20 1020  LIPASE 26   No results for input(s): AMMONIA in the last 168 hours. Coagulation Profile: Recent Labs  Lab 05/15/20 1020  INR 1.1    Cardiac Enzymes: No results for input(s): CKTOTAL, CKMB, CKMBINDEX, TROPONINI in the last 168 hours. BNP (last 3 results) No results for input(s): PROBNP in the last 8760 hours. HbA1C: No results for input(s): HGBA1C in the last 72 hours. CBG: Recent Labs  Lab 05/19/20 1557 05/20/20 0752 05/20/20 1111 05/20/20 1704 05/21/20 0751  GLUCAP 114* 93 100* 91 108*   Lipid Profile: No results for input(s): CHOL, HDL, LDLCALC, TRIG, CHOLHDL, LDLDIRECT in the last 72 hours. Thyroid Function Tests: No results for input(s): TSH, T4TOTAL, FREET4, T3FREE, THYROIDAB in the last 72 hours. Anemia Panel: No results for input(s): VITAMINB12, FOLATE, FERRITIN, TIBC, IRON, RETICCTPCT in the last 72 hours. Sepsis Labs: No results for input(s): PROCALCITON, LATICACIDVEN in the last 168 hours.  Recent Results (from the past 240 hour(s))  SARS Coronavirus 2 by RT PCR (hospital order, performed in [facility] hospital lab) Nasopharyngeal Nasopharyngeal Swab     Status: None   Collection Time: 05/15/20  9:44 AM   Specimen: Nasopharyngeal Swab  Result Value Ref Range Status   SARS Coronavirus 2 NEGATIVE NEGATIVE Final    Comment: (NOTE) SARS-CoV-2 target nucleic acids are NOT DETECTED.  The SARS-CoV-2 RNA is generally detectable in upper and lower respiratory specimens during the acute phase of infection. The lowest concentration of SARS-CoV-2 viral copies this assay can detect is 250 copies / mL. A negative result does not preclude SARS-CoV-2 infection and should not be used as the sole basis for treatment or other patient management decisions.  A negative result may occur with improper specimen collection / handling, submission of specimen other than nasopharyngeal swab, presence of viral mutation(s) within the areas targeted by this assay, and inadequate number of viral copies (<250 copies / mL). A negative result must be combined with clinical observations, patient history, and  epidemiological information.  Fact Sheet for Patients:   StrictlyIdeas.no  Fact Sheet for Healthcare Providers: BankingDealers.co.za  This test is not yet approved or  cleared by the [place] FDA and has been authorized for detection and/or diagnosis of SARS-CoV-2 by FDA under an Emergency Use Authorization (EUA).  This EUA will remain in effect (meaning this test can be used) for the duration of the COVID-19 declaration under Section 564(b)(1) of the Act, 21 U.S.C. section 360bbb-3(b)(1), unless the authorization is terminated or revoked sooner.  Performed at [facility], [place] [address], [place], [place] [address]  MRSA PCR Screening     Status: None   Collection Time: 05/15/20  4:48 PM   Specimen: Nasopharyngeal  Result Value Ref Range Status   MRSA by PCR NEGATIVE NEGATIVE Final    Comment:        The GeneXpert MRSA Assay (FDA approved for NASAL specimens only), is one component of a comprehensive MRSA colonization surveillance program. It is not intended to diagnose MRSA infection nor to guide or monitor treatment for MRSA infections. Performed at [facility], [place] [address], [place], [place] [address]       Radiology Studies: No results found.    Scheduled Meds: . aspirin EC  81 mg Oral Daily  . carvedilol  3.125 mg Oral BID  . Chlorhexidine Gluconate Cloth  6 each Topical Daily  . mouth rinse  15 mL Mouth Rinse BID  . pantoprazole  40 mg Oral BID AC  . sodium chloride flush  10-40 mL Intracatheter Q12H  . sodium chloride flush  3 mL Intravenous Q12H   Continuous Infusions:    Assessment/Plan:  1.  Upper GI bleed: S/p EGD on 9/17 which showed non-bleeding gastric ulcers with no stigmata of bleeding. Biopsied.  A single non-bleeding angiodysplastic lesion in the duodenum. Treated with a monopolar probe" Protonix drip x72hrs, transition to PPI twice daily , patient  will need PPI twice daily for a month then daily indefinitely, avoid alcohol".  Patient already on PPI twice daily at baseline, will continue the same upon discharge. Enterology discussed with my partner on 9/21 and cleared the patient for antiplatelet or anticoagulation  2.  Acute blood loss anemia: In the setting of problem #1.  Patient received 2 units PRBC as presenting hemoglobin was 5.8.  Posttransfusion hemoglobin improved to 8.4.  After EGD, hemoglobin again drifted down to 6.6 and received another 2 units of PRBC on 9/19.  Posttransfusion hemoglobin remains between 8 and 9 and his stool has become more brown and less dark  3.  Chronic systolic CHF with low EF (less than 20%) and LV thrombus: Patient seen by [facility] cardiology (Dr. Rayann Heman) previously.  Patient states he stopped taking Coumadin couple of months back as he has been unable to afford follow-up visits or Coumadin checks.  He states he is trying to clear PCP co-pay dues this month and plans to set up cardiology visit next month when he can afford $21 co-pay.  My partner discussed with Dr. Percival Spanish of cardiology who recommended if he has not taken the Coumadin to hold off until follow-up Will resume baby aspirin as discussed with cardiology and GI.  Diuretics on hold as inpatient.  4.  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  Was elevated at 2.6 yesterday and started on hydration.  He is not on any ACE inhibitors but noted to be on torsemide--held today and will continue hydration for another day as creatinine only minimally improved at 2.5.  Continue IV fluid at 50 cc an hour  5. BPH with chronic indwelling Foley catheter: Patient undergoes monthly changes through urology office and last changed on 9/8.  Resume tamsulosin  6.  Chronic lower extremity venous stasis/wounds: Follows wound care clinic and has Unna boot changes every week.  Also has home health wound care coming once a week.  DVT prophylaxis:  On Unna boots, SCDs Code Status: Full code Family / Patient Communication: Discussed with patient Disposition Plan:   Status is: Inpatient  Remains inpatient appropriate because:IV treatments  appropriate due to intensity of illness or inability to take PO   Dispo: The patient is from: Home              Anticipated d/c is to: Home with home PT (patient feels he does not need it), patient also needs a ride to be set up in AM.              Anticipated d/c date is: 1 day if AKI improves.              Patient currently is not medically stable to d/c.     Time spent: 25 MIN     >50% time spent in discussions with care team and coordination of care.  Nita Sells, MD [facility] Pager in [place]  If 7PM-7AM, please contact night-coverage [email or web address] 05/21/2020, 11:19 AM

## 2020-05-22 ENCOUNTER — Encounter (HOSPITAL_BASED_OUTPATIENT_CLINIC_OR_DEPARTMENT_OTHER): Payer: Medicare Other | Admitting: Internal Medicine

## 2020-05-22 LAB — BASIC METABOLIC PANEL
Anion gap: 9 (ref 5–15)
BUN: 55 mg/dL — ABNORMAL HIGH (ref 8–23)
CO2: 23 mmol/L (ref 22–32)
Calcium: 8.4 mg/dL — ABNORMAL LOW (ref 8.9–10.3)
Chloride: 106 mmol/L (ref 98–111)
Creatinine, Ser: 2.07 mg/dL — ABNORMAL HIGH (ref 0.61–1.24)
GFR calc Af Amer: 35 mL/min — ABNORMAL LOW (ref >60)
GFR calc non Af Amer: 30 mL/min — ABNORMAL LOW (ref >60)
Glucose, Bld: 92 mg/dL (ref 70–99)
Potassium: 4.2 mmol/L (ref 3.5–5.1)
Sodium: 138 mmol/L (ref 135–145)

## 2020-05-22 LAB — GLUCOSE, CAPILLARY
Glucose-Capillary: 90 mg/dL (ref 70–99)
Glucose-Capillary: 96 mg/dL (ref 70–99)

## 2020-05-22 MED ORDER — TORSEMIDE 20 MG PO TABS: 20.0000 mg | ORAL_TABLET | Freq: Every day | ORAL | 0 refills | Status: DC

## 2020-05-22 NOTE — Discharge Summary (Signed)
Physician Discharge Summary  Syed Zukas SWF:[id number] DOB: Jul 07, 1941 DOA: 05/15/2020  PCP: [name], MD  Admit date: 05/15/2020 Discharge date: 05/22/2020  Time spent: 27 minutes  Recommendations for Outpatient Follow-up:  1. Recommend outpatient CBC, Chem-12 1 week 2. Follow-up with wound care center at [facility] at the [name] urology building for further management 3. Home health ordered to help with management chronic medical issues lower extremity wound med changes 4. Outpatient follow-up needed with cardiology-PCP to kindly coordinate with regards to the need for Coumadin 5. Note dosage changes of diuretics this admission  Discharge Diagnoses:  Principal Problem:   Acute GI bleeding Active Problems:   Hyperlipidemia   Essential hypertension   Anemia of chronic disease   CKD (chronic kidney disease), stage IV (HCC)   Chest pain   Chronic systolic CHF (congestive heart failure) (HCC)   Symptomatic anemia   Multiple gastric ulcers   Discharge Condition: Improved  Diet recommendation: Low-salt  Filed Weights   05/19/20 0407 05/20/20 0415 05/21/20 0328  Weight: 85.1 kg 83.1 kg 82.9 kg    History of present illness:  79 year old black male home dwelling known HFrEF EF 20% October CKD chronic lower extremity wounds followed at wound care center at [facility] well, LV thrombus apparently not on Coumadin prior GI bleed  Presented from wound care center with chest pain bloody movements bright red blood EGD in the past = gastric body erythema nonbleeding gastric ulcers with clean base Initially BUN/creatinine 96/2.1 BNP 2100 FOBT positive hemoglobin 5 Patient was transfused Protonix drip started patient was seen by Dr. Benson Norway and underwent EGD as below  Hospital Course:  UGI B-nonbleeding ulcers no stigmata-angiodysplastic lesion Given Protonix gtt. 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 Lurlean Horns taking Coumadin because of affordability-could not afford to take the same-cardiology will need follow-up I will discuss with them for outpatient follow-up and send him a secure Message to coordinate the same-continue baby aspirin Diuretics cautiously resumed at lower dose as below  AKI on CKD 3 Creatinine was elevated likely secondary to GI bleed in addition but patient was on torsemide This was discontinued during hospital stay-he was hydrated and sent home subsequently  BPH with chronic indwelling Foley-continue catheter on discharge  Wound care for lower extremities as below G-wound care nurse saw the patient and made recommendations We will follow up at [facility] wound care  Procedures:  EGD as above   Consultations:  Gastroenterology  Cardiology was telephone consulted  Discharge Exam: Vitals:   05/21/20 2020 05/22/20 0553  BP: (!) 121/57 108/68  Pulse: 89 76  Resp: 20 20  Temp: 99 F (37.2 C) 97.8 F (36.6 C)  SpO2: 100% 100%    General: Sitting up pleasant alert no distress EOMI NCAT no focal deficit Cardiovascular: S1-S2 no murmur rub or gallop monitor is benign Respiratory: Clinically clear no added sound no rales rhonchi Abdomen soft no rebound but slight distention wounds not examined he has Unna boots on plan   Discharge Instructions   Discharge Instructions    Call MD for:  difficulty breathing, headache or visual disturbances   Complete by: As directed    Call MD for:  extreme fatigue   Complete by: As directed    Call  MD for:  persistant dizziness or light-headedness   Complete by: As directed    Call MD for:  persistant nausea and vomiting   Complete by: As directed    Call MD for:  severe uncontrolled pain   Complete by: As  directed    Call MD for:  temperature >100.4   Complete by: As directed    Diet - low sodium heart healthy   Complete by: As directed    Discharge instructions   Complete by: As directed    Review medications on discharge-we will change some of them You may resume your antiplatelet agent like aspirin in the outpatient setting Do not take the high-dose of fluid pills as discussed some dehydration he will have to follow-up with your outpatient physician for further management Please follow-up with cardiology   Discharge wound care:   Complete by: As directed    Continue 4 layer compression wraps, follow-up with wound care clinic   Discharge wound care:   Complete by: As directed    Resume Unna Boot, leg wound care   Discharge wound care:   Complete by: As directed    Wound type: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 Continue present plan of care which they have ordered.  Wounds are unchanged in size and appearance; refer to previous consult note.  Drainage mod amt tan drainage, no odor Dressing procedure/placement/frequency:Cleansed lower legs with NS and patted dry, then applied aquacel Ag to open wounds. Wrapped both legs in 4 layer Profore compression bandages.   Increase activity slowly   Complete by: As directed    Increase activity slowly   Complete by: As directed    Increase activity slowly   Complete by: As directed      Allergies as of 05/22/2020   No Known Allergies     Medication List    STOP taking these medications   polyethylene glycol 17 g packet Commonly known as: MIRALAX / GLYCOLAX   simethicone 80 MG chewable tablet Commonly known as: MYLICON   tamsulosin 0.4 MG Caps capsule Commonly known as: FLOMAX   warfarin 7.5 MG tablet Commonly known as: COUMADIN     TAKE these medications   alum & mag hydroxide-simeth 200-200-20 MG/5ML suspension Commonly known as:  MAALOX/MYLANTA Take 15 mLs by mouth every 4 (four) hours as needed for indigestion or heartburn.   aspirin EC 81 MG tablet Take 81 mg by mouth daily. Swallow whole.   carvedilol 3.125 MG tablet Commonly known as: Coreg Take 1 tablet (3.125 mg total) by mouth 2 (two) times daily.   feeding supplement (ENSURE ENLIVE) Liqd Take 237 mLs by mouth 2 (two) times daily between meals.   pantoprazole 40 MG tablet Commonly known as: PROTONIX Take 1 tablet (40 mg total) by mouth 2 (two) times daily before a meal.   torsemide 20 MG tablet Commonly known as: DEMADEX Take 1 tablet (20 mg total) by mouth daily. What changed: how much to take            Discharge Care Instructions  (From admission, onward)         Start     Ordered   05/22/20 0000  Discharge wound care:       Comments: Resume Unna Boot, leg wound care   05/22/20 0949   05/22/20 0000  Discharge wound care:       Comments: Wound type:Chronic full thickness stasis ulcers  to bilat legs; pt is followed by the outpatient wound care center prior to admission. Continue present plan of care which they have ordered.  Wounds are unchanged in size and appearance; refer to previous consult note.  Drainage mod amt tan drainage, no odor Dressing procedure/placement/frequency:Cleansed lower legs with NS and patted dry, then applied aquacel Ag to open wounds. Wrapped both legs in 4 layer Profore compression bandages.   05/22/20 0949   05/17/20 0000  Discharge wound care:       Comments: Continue 4 layer compression wraps, follow-up with wound care clinic   05/17/20 1547         No Known Allergies  Follow-up Information    [name], MD Follow up in 1 week(s).   Specialty: Family Medicine Why: hospital discharge follow up, repeat cbc/bmp at follow up pcp to refer you to cardiology Contact information: [place] [address] [phone]        Jerline Pain, MD .   Specialty:  Cardiology Contact information: [address] [address] [phone]        Thompson Grayer, MD .   Specialty: Cardiology Contact information: [place] [address] [phone]                The results of significant diagnostics from this hospitalization (including imaging, microbiology, ancillary and laboratory) are listed below for reference.    Significant Diagnostic Studies: DG Chest 2 View  Result Date: 05/15/2020 CLINICAL DATA:  Chest pain. EXAM: CHEST - 2 VIEW COMPARISON:  June 18, 2019. FINDINGS: Mild cardiomegaly is noted. No pneumothorax or pleural effusion is noted. Both lungs are clear. The visualized skeletal structures are unremarkable. IMPRESSION: No active cardiopulmonary disease. Electronically Signed   By: Marijo Conception M.D.   On: 05/15/2020 09:45    Microbiology: Recent Results (from the past 240 hour(s))  SARS Coronavirus 2 by RT PCR (hospital order, performed in [facility] hospital lab) Nasopharyngeal Nasopharyngeal Swab     Status: None   Collection Time: 05/15/20  9:44 AM   Specimen: Nasopharyngeal Swab  Result Value Ref Range Status   SARS Coronavirus 2 NEGATIVE NEGATIVE Final    Comment: (NOTE) SARS-CoV-2 target nucleic acids are NOT DETECTED.  The SARS-CoV-2 RNA is generally detectable in upper and lower respiratory specimens during the acute phase of infection. The lowest concentration of SARS-CoV-2 viral copies this assay can detect is 250 copies / mL. A negative result does not preclude SARS-CoV-2 infection and should not be used as the sole basis for treatment or other patient management decisions.  A negative result may occur with improper specimen collection / handling, submission of specimen other than nasopharyngeal swab, presence of viral mutation(s) within the areas targeted by this assay, and inadequate number of viral copies (<250 copies / mL). A negative result must  be combined with clinical observations, patient history, and epidemiological information.  Fact Sheet for Patients:   StrictlyIdeas.no  Fact Sheet for Healthcare Providers: BankingDealers.co.za  This test is not yet approved or  cleared by the [place] FDA and has been authorized for detection and/or diagnosis of SARS-CoV-2 by FDA under an Emergency Use Authorization (EUA).  This EUA will remain in effect (meaning this test can be used) for the duration of the COVID-19 declaration under Section 564(b)(1) of the Act, 21 U.S.C. section 360bbb-3(b)(1), unless the authorization is terminated or revoked sooner.  Performed at [facility]  [place] [address], [place], [place] [address]   MRSA PCR Screening     Status: None   Collection Time: 05/15/20  4:48 PM   Specimen: Nasopharyngeal  Result Value Ref Range Status   MRSA by PCR NEGATIVE NEGATIVE Final    Comment:        The GeneXpert MRSA Assay (FDA approved for NASAL specimens only), is one component of a comprehensive MRSA colonization surveillance program. It is not intended to diagnose MRSA infection nor to guide or monitor treatment for MRSA infections. Performed at [facility], [place] [address], [place], [place] [address]      Labs: Basic Metabolic Panel: Recent Labs  Lab 05/17/20 1115 05/19/20 0408 05/20/20 0453 05/21/20 0616 05/22/20 0832  NA 137 139 139 140 138  K 4.6 4.4 4.2 4.1 4.2  CL 109 107 106 106 106  CO2 20* 24 24 25 23   GLUCOSE 107* 96 115* 86 92  BUN 71* 67* 63* 57* 55*  CREATININE 2.09* 2.60* 2.54* 2.42* 2.07*  CALCIUM 8.5* 8.4* 8.3* 8.3* 8.4*  MG 2.3  --   --   --   --    Liver Function Tests: Recent Labs  Lab 05/15/20 1020 05/19/20 0408  AST 14* 20  ALT 9 13  ALKPHOS 59 64  BILITOT 1.0 1.6*  PROT 7.4 7.7  ALBUMIN 2.9* 3.1*   Recent Labs  Lab 05/15/20 1020  LIPASE 26   No results for input(s):  AMMONIA in the last 168 hours. CBC: Recent Labs  Lab 05/15/20 1020 05/15/20 2330 05/16/20 0555 05/16/20 1319 05/19/20 0408 05/19/20 1339 05/20/20 0453 05/20/20 1705 05/21/20 0616  WBC 5.5  --  5.4  --  5.1  --   --   --   --   NEUTROABS 4.7  --   --   --   --   --   --   --   --   HGB 5.8*   < > 7.2*   < > 8.7*  8.8* 9.4* 8.3* 9.5* 8.1*  HCT 18.6*   < > 22.3*   < > 27.5*  27.8* 31.0* 26.4* 30.2* 25.9*  MCV 95.4  --  91.0  --  92.7  --   --   --   --   PLT 195  --  176  --  228  --   --   --   --    < > = values in this interval not displayed.   Cardiac Enzymes: No results for input(s): CKTOTAL, CKMB, CKMBINDEX, TROPONINI in the last 168 hours. BNP: BNP (last 3 results) Recent Labs    06/18/19 2109 05/15/20 1020  BNP 2,536.0* 2,167.3*    ProBNP (last 3 results) No results for input(s): PROBNP in the last 8760 hours.  CBG: Recent Labs  Lab 05/20/20 1704 05/21/20 0751 05/21/20 1153 05/21/20 1700 05/22/20 0720  GLUCAP 91 108* 135* 85 90       Signed:  Nita Sells MD   [facility] 05/22/2020, 9:49 AM

## 2020-05-22 NOTE — TOC Transition Note (Signed)
Transition of Care [facility]) - CM/SW Discharge Note   Patient Details  Name: Abdoulie Tierce MRN: [id number] Date of Birth: [birth year]  Transition of Care [facility]) CM/SW Contact:  Ross Ludwig, LCSW Phone Number: 05/22/2020, 11:09 AM   Clinical Narrative:     Patient will be going home with home health through [facility].  CSW signing off please reconsult with any other social work needs, home health agency has been notified of planned discharge.  Patient will be discharging today.   Final next level of care: [place] Barriers to Discharge: Continued Medical Work up   Patient Goals and CMS Choice Patient states their goals for this hospitalization and ongoing recovery are:: To go home with home health. CMS Medicare.gov Compare Post Acute Care list provided to:: Patient Choice offered to / list presented to : Patient  Discharge Placement                       Discharge Plan and Services In-house Referral: NA Discharge Planning Services: NA Post Acute Care Choice: Home Health            DME Agency: NA       HH Arranged: RN, PT, OT [facility] Agency: [place] Date [facility] Agency Contacted: 05/22/20 Time HH Agency Contacted: 1109 Representative spoke with at [place]: [place] Determinants of Health ([place]) Interventions     Readmission Risk Interventions No flowsheet data found.

## 2020-05-22 NOTE — Care Management Important Message (Signed)
Important Message  Patient Details IM Letter given to the Patient Name: [name] MRN: [id number] Date of Birth: 06/07/1941   Medicare Important Message Given:  Yes     [name] 05/22/2020, 10:28 AM

## 2020-05-22 NOTE — Consult Note (Addendum)
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 Continue present plan of care which they have ordered.  Wounds are unchanged in size and appearance; refer to previous consult note.  Drainage mod amt tan drainage, no odor Dressing procedure/placement/frequency: Cleansed lower legs with NS and patted dry, then applied aquacel Ag to open wounds. Wrapped both legs in 4 layer Profore compression bandages.  If patient is still in the hospital on Mon, [place] will change at that time.  Pr should resume follow-up with the outpatient wound care center after discharge.   Julien Girt MSN, RN, [place], [place], [place]

## 2020-05-29 ENCOUNTER — Encounter (HOSPITAL_BASED_OUTPATIENT_CLINIC_OR_DEPARTMENT_OTHER): Payer: Medicare Other | Admitting: Internal Medicine

## 2020-05-29 DIAGNOSIS — L97822 Non-pressure chronic ulcer of other part of left lower leg with fat layer exposed: Secondary | ICD-10-CM | POA: Diagnosis not present

## 2020-05-29 DIAGNOSIS — I87333 Chronic venous hypertension (idiopathic) with ulcer and inflammation of bilateral lower extremity: Secondary | ICD-10-CM | POA: Diagnosis not present

## 2020-05-29 DIAGNOSIS — L97812 Non-pressure chronic ulcer of other part of right lower leg with fat layer exposed: Secondary | ICD-10-CM | POA: Diagnosis not present

## 2020-05-29 DIAGNOSIS — I429 Cardiomyopathy, unspecified: Secondary | ICD-10-CM | POA: Diagnosis not present

## 2020-05-29 DIAGNOSIS — I509 Heart failure, unspecified: Secondary | ICD-10-CM | POA: Diagnosis not present

## 2020-05-29 DIAGNOSIS — R079 Chest pain, unspecified: Secondary | ICD-10-CM | POA: Diagnosis not present

## 2020-05-29 DIAGNOSIS — I890 Lymphedema, not elsewhere classified: Secondary | ICD-10-CM | POA: Diagnosis not present

## 2020-05-29 DIAGNOSIS — K921 Melena: Secondary | ICD-10-CM | POA: Diagnosis not present

## 2020-05-29 NOTE — Progress Notes (Signed)
[name], [name] ([id number]) Visit Report for 05/29/2020 Arrival Information Details Patient Name: Date of Service: [place], [place] 05/29/2020 8:15 A M Medical Record Number: [id number] Patient Account Number: [id number] Date of Birth/Sex: Treating RN: March 24, 1941 (79 y.o. [name] Primary Care Rector Devonshire: [name]., [name] Other Clinician: Referring [name]: Treating [name]/Extender: [name]., [name] in Treatment: 57 Visit Information History Since Last Visit Added or deleted any medications: No Patient Arrived: Cane Any new allergies or adverse reactions: No Arrival Time: 08:40 Had a fall or experienced change in No Accompanied By: self activities of daily living that may affect Transfer Assistance: None risk of falls: Patient Identification Verified: Yes Signs or symptoms of abuse/neglect since last visito No Secondary Verification Process Completed: Yes Hospitalized since last visit: No Patient Requires Transmission-Based Precautions: No Implantable device outside of the clinic excluding No Patient Has Alerts: Yes cellular tissue based products placed in the center Patient Alerts: Patient on Blood Thinner since last visit: Has Dressing in Place as Prescribed: Yes Has Compression in Place as Prescribed: Yes Pain Present Now: No Electronic Signature(s) Signed: 05/29/2020 5:04:32 PM By: [name] Entered By: [name] on 05/29/2020 08:40:58 -------------------------------------------------------------------------------- Compression Therapy Details Patient Name: Date of Service: [place] 05/29/2020 8:15 A M Medical Record Number: [id number] Patient Account Number: [id number] Date of Birth/Sex: Treating RN: Aug 21, 1941 (79 y.o. [name] Primary Care [name]: [name]., [name] Other Clinician: Referring [name]: Treating [name]/Extender: [name]., [name] in  Treatment: 33 Compression Therapy Performed for Wound Assessment: Wound #3 Right,Proximal,Lateral Lower Leg Performed By: Clinician Troy Gouty, RN Compression Type: Four Layer Pre Treatment ABI: 1.1 Post Procedure Diagnosis Same as Pre-procedure Electronic Signature(s) Signed: 05/29/2020 5:01:36 PM By: [name] Entered By: [name] on 05/29/2020 08:59:46 -------------------------------------------------------------------------------- Compression Therapy Details Patient Name: Date of Service: [place], [place] 05/29/2020 [place] Record Number: [id number] Patient Account Number: [id number] Date of Birth/Sex: Treating RN: 1941-01-01 (79 y.o. [name] Primary Care Franco Duley: [name]., [name] Other Clinician: Referring Troy Honea: Treating [name]/Extender: [name]., [name] in Treatment: 33 Compression Therapy Performed for Wound Assessment: Wound #4 Right,Distal,Lateral Lower Leg Performed By: Clinician Troy Gouty, RN Compression Type: Four Layer Pre Treatment ABI: 1.1 Post Procedure Diagnosis Same as Pre-procedure Electronic Signature(s) Signed: 05/29/2020 5:01:36 PM By: [name] Entered By: [name] on 05/29/2020 08:59:46 -------------------------------------------------------------------------------- Encounter Discharge Information Details Patient Name: Date of Service: [place], [place] 05/29/2020 [place] Record Number: [id number] Patient Account Number: [id number] Date of Birth/Sex: Treating RN: [birth year] (79 y.o. [name] Primary Care [name]: [name]., [name] Other Clinician: Referring [name]: Treating [name]/Extender: [name]., [name] in Treatment: 33 Encounter Discharge Information Items Discharge Condition: Stable Ambulatory Status: Cane Discharge Destination: Home Transportation: Other Accompanied By: self Schedule Follow-up  Appointment: Yes Clinical Summary of Care: Patient Declined Notes SCAT Electronic Signature(s) Signed: 05/29/2020 4:42:15 PM By: Troy Gouty RN, BSN Entered By: [name] on 05/29/2020 09:24:36 -------------------------------------------------------------------------------- Lower Extremity Assessment Details Patient Name: Date of Service: [place] [facility] 05/29/2020 [place] Record Number: [id number] Patient Account Number: [id number] Date of Birth/Sex: Treating RN: June 22, 1941 (79 y.o. [name] Primary Care [name]: [name]., [name] Other Clinician: Referring [name]: Treating [name]/Extender: [name]  JR., [name] in Treatment: 33 Edema Assessment Assessed: [Left: No] [Right: No] Edema: [Left: Yes] [Right: Yes] Calf Left: Right: Point of Measurement: 43 cm From Medial Instep 42.5 cm 41 cm Ankle Left: Right: Point of Measurement: 14 cm From Medial Instep 22 cm 22 cm Vascular Assessment Pulses: Dorsalis Pedis Palpable: [Left:No] [Right:No] Electronic Signature(s) Signed: 05/29/2020 5:04:32 PM By: [name] Entered By: [name] on 05/29/2020 08:45:48 -------------------------------------------------------------------------------- Multi Wound Chart Details Patient Name: Date of Service: [place], [place] 05/29/2020 8:15 A M Medical Record Number: [id number] Patient Account Number: [id number] Date of Birth/Sex: Treating RN: 13-Apr-1941 (79 y.o. [name], [name] Primary Care Madilynne Mullan: [name]., [name] Other Clinician: Referring [name]: Treating [name]/Extender: [name]., [name] in Treatment: 61 Vital Signs Height(in): 71 Pulse(bpm): 84 Weight(lbs): 198 Blood Pressure(mmHg): 117/66 Body Mass Index(BMI): 28 Temperature(F): 97.7 Respiratory Rate(breaths/min): 20 Photos: [3:No Photos Right, Proximal, Lateral Lower Leg] [4:No Photos Right, Distal, Lateral  Lower Leg] [6:No Photos Left, Proximal, Lateral Lower Leg] Wound Location: [3:Blister] [4:Blister] [6:Gradually Appeared] Wounding Event: [3:Venous Leg Ulcer] [4:Venous Leg Ulcer] [6:Venous Leg Ulcer] Primary Etiology: [3:Anemia, Congestive Heart Failure,] [4:Anemia, Congestive Heart Failure,] [6:Anemia, Congestive Heart Failure,] Comorbid History: [3:Hypertension, Peripheral Venous Disease, 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] [4:Hypertension, Peripheral Venous Disease, 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] [6:Hypertension, Peripheral Venous Disease, End Stage Renal Disease 12/13/2019] Date Acquired: [3:33] [4:33] [6:24] Weeks of Treatment: [3:Open] [4:Open] [6:Healed - Epithelialized] Wound Status: [3:1.6x0.6x0.1] [4:2.4x1.5x0.6] [6:0x0x0] Measurements L x W x D (cm) [3:0.754] [4:2.827] [6:0] A (cm) : rea [3:0.075] [4:1.696] [6:0] Volume (cm) : [3:84.80%] [4:69.60%] [6:100.00%] % Reduction in A rea: [3:84.80%] [4:-82.40%] [6:100.00%] % Reduction in Volume: [4:12] Position 1 (o'clock): [4:1.5] Maximum Distance 1 (cm): [3:No] [4:Yes] [6:No] Tunneling: [3:Full Thickness Without Exposed] [4:Full Thickness Without Exposed] [6:Full Thickness Without Exposed] Classification: [3:Support Structures Medium] [4:Support Structures Medium] [6:Support Structures None Present] Exudate Amount: [3:Serosanguineous] [4:Serosanguineous] [6:N/A] Exudate Type: [3:red, brown] [4:red, brown] [6:N/A] Exudate Color: [3:Distinct, outline attached] [4:Well defined, not attached] [6:Distinct, outline attached] Wound Margin: [3:Large (67-100%)] [4:Large (67-100%)] [6:None Present (0%)] Granulation Amount: [3:Red, Pink] [4:Red, Pink] [6:N/A] Granulation Quality: [3:Small (1-33%)] [4:Small (1-33%)] [6:None Present (0%)] Necrotic Amount: [3:Fat Layer (Subcutaneous Tissue): Yes Fat Layer (Subcutaneous Tissue): Yes Fascia: No] Exposed Structures: [3:Fascia: No Tendon: No Muscle: No Joint: No Bone: No Small (1-33%)] [4:Fascia:  No Tendon: No Muscle: No Joint: No Bone: No Small (1-33%)] [6:Fat Layer (Subcutaneous Tissue): No Tendon: No Muscle: No Joint: No Bone: No N/A] Epithelialization: [3:Compression Therapy] [4:Compression Therapy] [6:N/A] Treatment Notes Electronic Signature(s) Signed: 05/29/2020 4:51:06 PM By: Linton Ham MD Signed: 05/29/2020 5:01:36 PM By: [name] Entered By: Linton Ham on 05/29/2020 09:08:58 -------------------------------------------------------------------------------- Multi-Disciplinary Care Plan Details Patient Name: Date of Service: [place], [place] 05/29/2020 8:15 A M Medical Record Number: [id number] Patient Account Number: [id number] Date of Birth/Sex: Treating RN: [birth year] (79 y.o. [name], [name] Primary Care Shemeika Starzyk: [name]., [name] Other Clinician: Referring Gokul Waybright: Treating Vartan Kerins/Extender: [name]., [name] in Treatment: 33 Active Inactive Venous Leg Ulcer Nursing Diagnoses: Knowledge deficit related to disease process and management Potential for venous Insuffiency (use before diagnosis confirmed) Goals: Patient will maintain optimal edema control Date Initiated: 05/01/2020 Target Resolution Date: 06/27/2020 Goal Status: Active Interventions: Assess peripheral edema status every visit. Compression as ordered Provide education on venous insufficiency Notes: Wound/Skin Impairment Nursing Diagnoses: Knowledge deficit related to ulceration/compromised skin integrity Goals: Patient/caregiver will verbalize  understanding of skin care regimen Date Initiated: 10/11/2019 Target Resolution Date: 06/27/2020 Goal Status: Active Interventions: Assess patient/caregiver ability to obtain necessary supplies Assess patient/caregiver ability to perform ulcer/skin care regimen upon admission and as needed Assess ulceration(s) every visit Provide education on ulcer and skin care Treatment Activities: Skin care regimen  initiated : 10/11/2019 Topical wound management initiated : 10/11/2019 Notes: Electronic Signature(s) Signed: 05/29/2020 5:01:36 PM By: [name] Entered By: [name] on 05/29/2020 08:34:21 -------------------------------------------------------------------------------- Pain Assessment Details Patient Name: Date of Service: [place], [place] 05/29/2020 [place] Record Number: [id number] Patient Account Number: [id number] Date of Birth/Sex: Treating RN: Jun 17, 1941 (79 y.o. [name] Primary Care Demani Mcbrien: [name]., [name] Other Clinician: Referring Angelito Hopping: Treating Michell Kader/Extender: [name]., [name] in Treatment: 33 Active Problems Location of Pain Severity and Description of Pain Patient Has Paino No Site Locations Pain Management and Medication Current Pain Management: Electronic Signature(s) Signed: 05/29/2020 5:04:32 PM By: [name] Entered By: [name] on 05/29/2020 08:45:26 -------------------------------------------------------------------------------- Patient/Caregiver Education Details Patient Name: Date of Service: [place], [place] 9/30/2021andnbsp8:15 [place] Record Number: [id number] Patient Account Number: [id number] Date of Birth/Gender: Treating RN: February 23, 1941 (79 y.o. [name] Primary Care Physician: [name]., [name] Other Clinician: Referring Physician: Treating Physician/Extender: [name]., [name] in Treatment: 53 Education Assessment Education Provided To: Patient Education Topics Provided Wound/Skin Impairment: Handouts: Skin Care Do's and Dont's Methods: Explain/Verbal Responses: Reinforcements needed Electronic Signature(s) Signed: 05/29/2020 5:01:36 PM By: [name] Entered By: [name] on 05/29/2020 08:34:35 -------------------------------------------------------------------------------- Wound Assessment  Details Patient Name: Date of Service: [name] [facility] 05/29/2020 [place] Record Number: [id number] Patient Account Number: [id number] Date of Birth/Sex: Treating RN: Oct 14, 1940 (79 y.o. [name] Primary Care Breyton Vanscyoc: [name]., [name] Other Clinician: Referring Biannca Scantlin: Treating Girard Koontz/Extender: [name]., [name] in Treatment: 33 Wound Status Wound Number: 3 Primary Venous Leg Ulcer Etiology: Wound Location: Right, Proximal, Lateral Lower Leg Wound Open Wounding Event: Blister Status: Date Acquired: 09/11/2019 Comorbid Anemia, Congestive Heart Failure, Hypertension, Peripheral Weeks Of Treatment: 33 History: Venous Disease, End Stage Renal Disease Clustered Wound: No Wound Measurements Length: (cm) 1.6 Width: (cm) 0.6 Depth: (cm) 0.1 Area: (cm) 0.754 Volume: (cm) 0.075 % Reduction in Area: 84.8% % Reduction in Volume: 84.8% Epithelialization: Small (1-33%) Tunneling: No Undermining: No Wound Description Classification: Full Thickness Without Exposed Support Structures Wound Margin: Distinct, outline attached Exudate Amount: Medium Exudate Type: Serosanguineous Exudate Color: red, brown Foul Odor After Cleansing: No Slough/Fibrino Yes Wound Bed Granulation Amount: Large (67-100%) Exposed Structure Granulation Quality: Red, Pink Fascia Exposed: No Necrotic Amount: Small (1-33%) Fat Layer (Subcutaneous Tissue) Exposed: Yes Necrotic Quality: Adherent Slough Tendon Exposed: No Muscle Exposed: No Joint Exposed: No Bone Exposed: No Treatment Notes Wound #3 (Right, Proximal, Lateral Lower Leg) 2. Periwound Care Moisturizing lotion 3. Primary Dressing Applied Collegen AG Hydrogel or K-Y Jelly 4. Secondary Dressing ABD Pad Dry Gauze 6. Support Layer Applied 4 layer compression wrap Notes 4 layer wraps bilateral lower legs Electronic Signature(s) Signed: 05/29/2020 5:04:32 PM By: [name] Entered By: [name] on 05/29/2020 08:46:16 -------------------------------------------------------------------------------- Wound Assessment Details Patient Name: Date of Service: [place], [place] 05/29/2020 8:15 A M Medical Record Number: [id number] Patient Account Number: [id number] Date of Birth/Sex: Treating RN: 15-Aug-1941 (79 y.o. [name] Primary  Care Kattia Selley: [name]., [name] Other Clinician: Referring Shaia Porath: Treating Marvin Grabill/Extender: [name]., [name] in Treatment: 33 Wound Status Wound Number: 4 Primary Venous Leg Ulcer Etiology: Wound Location: Right, Distal, Lateral Lower Leg Wound Open Wounding Event: Blister Status: Date Acquired: 09/11/2019 Comorbid Anemia, Congestive Heart Failure, Hypertension, Peripheral Weeks Of Treatment: 33 History: Venous Disease, End Stage Renal Disease Clustered Wound: No Wound Measurements Length: (cm) 2.4 Width: (cm) 1.5 Depth: (cm) 0.6 Area: (cm) 2.827 Volume: (cm) 1.696 % Reduction in Area: 69.6% % Reduction in Volume: -82.4% Epithelialization: Small (1-33%) Tunneling: Yes Position (o'clock): 12 Maximum Distance: (cm) 1.5 Wound Description Classification: Full Thickness Without Exposed Support Structures Wound Margin: Well defined, not attached Exudate Amount: Medium Exudate Type: Serosanguineous Exudate Color: red, brown Foul Odor After Cleansing: No Slough/Fibrino Yes Wound Bed Granulation Amount: Large (67-100%) Exposed Structure Granulation Quality: Red, Pink Fascia Exposed: No Necrotic Amount: Small (1-33%) Fat Layer (Subcutaneous Tissue) Exposed: Yes Necrotic Quality: Adherent Slough Tendon Exposed: No Muscle Exposed: No Joint Exposed: No Bone Exposed: No Treatment Notes Wound #4 (Right, Distal, Lateral Lower Leg) 2. Periwound Care Moisturizing lotion 3. Primary Dressing Applied Collegen AG Hydrogel or K-Y Jelly 4. Secondary  Dressing ABD Pad Dry Gauze 6. Support Layer Applied 4 layer compression wrap Notes 4 layer wraps bilateral lower legs Electronic Signature(s) Signed: 05/29/2020 5:04:32 PM By: [name] Entered By: [name] on 05/29/2020 08:46:54 -------------------------------------------------------------------------------- Wound Assessment Details Patient Name: Date of Service: [place], [place] 05/29/2020 8:15 A M Medical Record Number: [id number] Patient Account Number: [id number] Date of Birth/Sex: Treating RN: 06-02-1941 (79 y.o. [name] Primary Care Tasman Zapata: [name]., [name] Other Clinician: Referring Damarion Mendizabal: Treating Johnathon Mittal/Extender: [name]., [name] in Treatment: 33 Wound Status Wound Number: 6 Primary Venous Leg Ulcer Etiology: Wound Location: Left, Proximal, Lateral Lower Leg Wound Healed - Epithelialized Wounding Event: Gradually Appeared Status: Date Acquired: 12/13/2019 Comorbid Anemia, Congestive Heart Failure, Hypertension, Peripheral Weeks Of Treatment: 24 History: Venous Disease, End Stage Renal Disease Clustered Wound: No Wound Measurements Length: (cm) Width: (cm) Depth: (cm) Area: (cm) Volume: (cm) 0 % Reduction in Area: 100% 0 % Reduction in Volume: 100% 0 Tunneling: No 0 Undermining: No 0 Wound Description Classification: Full Thickness Without Exposed Support Structures Wound Margin: Distinct, outline attached Exudate Amount: None Present Foul Odor After Cleansing: No Slough/Fibrino No Wound Bed Granulation Amount: None Present (0%) Exposed Structure Necrotic Amount: None Present (0%) Fascia Exposed: No Fat Layer (Subcutaneous Tissue) Exposed: No Tendon Exposed: No Muscle Exposed: No Joint Exposed: No Bone Exposed: No Electronic Signature(s) Signed: 05/29/2020 5:04:32 PM By: [name] Entered By: [name] on 05/29/2020  08:47:34 -------------------------------------------------------------------------------- [place] Details Patient Name: Date of Service: [place], [place] 05/29/2020 8:15 A M Medical Record Number: [id number] Patient Account Number: [id number] Date of Birth/Sex: Treating RN: 11-05-1940 (79 y.o. [name] Primary Care Lizvette Lightsey: [name]., [name] Other Clinician: Referring Starleen Trussell: Treating Yazir Koerber/Extender: [name]., [name] in Treatment: 33 Vital Signs Time Taken: 08:30 Temperature (F): 97.7 Height (in): 71 Pulse (bpm): 84 Weight (lbs): 198 Respiratory Rate (breaths/min): 20 Body Mass Index (BMI): 27.6 Blood Pressure (mmHg): 117/66 Reference Range: 80 - 120 mg / dl Electronic Signature(s) Signed: 05/29/2020 5:04:32 PM By: [name] Entered By: [name] on 05/29/2020 08:45:21

## 2020-05-29 NOTE — Progress Notes (Signed)
TORYN, MCCLINTON ([id number]) Visit Report for 05/29/2020 HPI Details Patient Name: Date of Service: [place], [place] 05/29/2020 8:15 A M Medical Record Number: [id number] Patient Account Number: [id number] Date of Birth/Sex: Treating RN: [birth year] (79 y.o. [name], [name] Primary Care Provider: [name]., [name] Other Clinician: Referring Provider: Treating Provider/Extender: [name], [name] in Treatment: 75 History of Present Illness HPI Description: ADMISSION 10/11/2019 This is a 79 year old man with a history of a severe cardiomyopathy with an ejection fraction of about 20%, chronic renal failure stage III. He is listed as a type II diabetic in epic although the patient denies this. He also has a history of PVD. He states for the last month he has had wounds on his bilateral lower extremities that started off as blisters which denuded. He has areas on the left lateral calf and 2 on the right lateral. He has an area on the left first met head which he did not know was there he we identified this on intake. He has been using Silvadene cream provided by his primary care physician but he is complaining that this burns. 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 He was acutely ill at that time complicating GI bleed. ABIs in our clinic were 1.16 on the right and 1.13 on the left 2/25; the patient comes in with his areas on the left lateral and right lateral calf. There is also an area over the left first MTP bunion deformity. We have been using Sorbact. His edema control is fairly good 3/4; left lateral and  right lateral calf. Most of his wounds are in the same position tightly adherent nonviable debris. On the right we debrided the superior wound on the left both wounds. The area on his bunion over the left first MTP medially is I think just about closed. We have been using Sorbact without a lot of success changed to Iodoflex under compression 3/11; left lateral and right lateral calf perhaps minor improvement in the surface condition. We have been using Iodoflex. The area over the bunion of the left first MTP has closed over 3/18; left lateral and right lateral calf not much improvement. We have been using Iodoflex. Aggressive debridement last week. 3/25; left lateral and right lateral calf. We have been using Iodoflex. There is some improvement in the superior area on the right and 2 on the lateral left although there is still a lot of debris on the surface. The inferior area on the right is still a completely nonviable surface. 4/8; left lateral and right lateral calfs. Essentially mirror-image looking wounds 2 wounds on each side in close juxtaposition we have been using Iodoflex with some improvement in the very adherent fibrinous debris but not a lot. The patient has arterial studies next Wednesday morning and venous studies next Thursday morning. I have been avoiding any further aggressive debridement until we see the arterial study results. His ABIs were fairly good in this clinic and is dorsalis pedis pulses are palpable but the wound beds are pale. 4/15; left lateral and right lateral calfs. Essentially mirror-image wounds with 2 wounds on each side in close juxtaposition  but with rims of separating normal tissue. We have been using Iodoflex. The base of the wounds has been cleaning up quite nicely ARTERIAL STUDIES were done showing the patient had an ABI on the right of 1.27 with triphasic waveforms and a TBI of 0.90 on the left triphasic waveforms with an ABI of 1.28 and a TBI of 0.87. No  evidence of arterial disease VENOUS REFLUX STUDIES; were done yesterday we do not have this report yet. 4/23; VENOUS REFLUX STUDIES did not show any significant reflux right lower extremity. No evidence of a DVT He did have significant reflux in the common . femoral vein on the left but nothing else was listed as significant. He did not have a DVT. We have been using Iodoflex under compression his wounds are making progress. 4/29; improvements in the wound surface continue. We have been using Iodoflex. He has a 20% out-of-pocket co-pay for Apligraf which is unfortunate. We changed him to Sorbact today 5/6; started on Sorbact last week. Better looking wound surface but not much change in dimensions. 01/24/20-Patient is back at 3 weeks, wound surfaces are about the same but very minimal slough on the right leg wounds, however there is blistering on both legs adjacent to the wounds, patient denies any other symptoms or new symptoms. His studies have been reviewed and do not indicate any significant arterial or venous disease. But he is attending once in 3 weeks with home health changing in between 6/3; patient has 4 wounds 2 on each side of his lateral lower legs. These wounds are somewhat improved. He apparently arrived in clinic last week with a large blister on the right lateral lower leg that had denuded into the wound. They changed to silver alginate. Still under compression. He has straight Medicare and unfortunately has an unlimited co-pay for advanced treatment options. 6/10; his original 4 wounds are all better especially on the left lateral lower leg. 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 6/24; blister on the right lateral lower leg has healed. There is no additional wounds. The traumatic left second toe is also closed. The 4 that he has are all better 7/1; all of the patient's wounds are smaller except for the proximal one on the left lateral calf. We are using silver collagen 7/22; patient has home health. Relatively refractory wounds on the lateral part of both mid calfs. Each of them 2 wounds. The areas on the left are doing much better in fact the distal 1 looks like it is on his way to closure. The right required debridement with adherent fibrinous debris under illumination. We have been using silver collagen. Previous arterial studies were within normal limits he also had venous reflux studies 04/03/2020 upon evaluation today patient appears to be doing excellent with regard to his lower extremity ulcers. He is going to require a little bit of debridement on the wounds on the right especially in order to clear away some biofilm and slough but this is minimal and overall he seems to be doing quite well. 8/19; the patient's wounds on the left lateral lower leg just above closed. However the 2  on the lateral part of the right have a nonviable necrotic surface. This is disappointing. There is also no change in dimensions. He has had arterial studies as well as venous reflux studies 8/26; very disappointing today. Even the more superficial areas on the left are about the same as last week. Still 2 punched-out areas completely unchanged on the right. We have been using silver collagen really making no progress. 9/2; changed to Iodoflex last week better looking wound surfaces especially on the right although they are deep and punched out. The areas on the left are more superficial open 1 of these is almost fully epithelialized although it has been this way for at least 2 weeks. He has a 20% co-pay [Medicare only] unaffordable for a skin  substitute. Had some thought about a snap VAC on the deep areas on the right leg we will try to put this through in the insurance 9/9; punched-out wounds on the bilateral lateral lower extremities. We have been operating as if the these were chronic venous wounds with secondary lymphedema. The areas on the left have been doing well in fact one of them is closed over. We have had no improvement in the areas on the right we have been using Iodoflex to help with surface debridement. The areas on the right are deeper wider. I do not see any evidence of infection. Home health is not been putting the wraps on high enough he has localized lymphedema right above the wounds. He has had arterial and venous studies already 9/16; punched-out mirror-image wounds on his bilateral lateral mid calfs. We have 1 closed on the left lateral the other smaller. For the first time today the areas on the right lateral actually looks some better. I did a biopsy of 1 of these last time although we still do not have this result. HOWEVER he arrives in clinic today complaining of chest pain overnight which felt like a brick on his chest. He also had 2 black watery bowel movements. He does not have nausea or vomiting. He is not describing abdominal pain. He has no prior history of diarrhea heartburn etc. 9/30; sent this patient to the ER on 9/16. 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 He is on twice daily PPIs Protonix. His admission hemoglobin was exceptionally low at 5. 8.. Discharged at 8.1. 4 unit transfusion. 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 His diuretic dose was actually decreased because of elevated creatinine currently at torsemide 20 mg not felt to be a Coumadin candidate. Patient states he feels well. The areas on his left leg are healed. The areas on the right look better. These have been very refractory  wounds. He does not have stockings we will make arrangements for this today Electronic Signature(s) Signed: 05/29/2020 4:51:06 PM By: Linton Ham MD Entered By: Linton Ham on 05/29/2020 09:12:22 -------------------------------------------------------------------------------- Physical Exam Details Patient Name: Date of Service: [place], [place] 05/29/2020 [place] Record Number: [id number] Patient Account Number: [id number] Date of Birth/Sex: Treating RN: [birth year] (79 y.o. [name], [name] Primary Care Provider: [name]., [name] Other Clinician: Referring Provider: Treating Provider/Extender: [name], [name] in Treatment: 33 Constitutional Sitting or standing Blood Pressure is within target range for patient.. Pulse regular and within target range for patient.Marland Kitchen Respirations regular, non-labored and within target range.. Temperature is normal  and within the target range for the patient.Marland Kitchen Appears in no distress. Respiratory work of breathing is normal. Cardiovascular Needle pulses are palpable. We have excellent edema control on both sides. Notes Wound exam; the area on the left is totally healed. On the right lateral calf the proximal 1 look at it looks as though it is progressing towards closure and the more distal lateral wound looks better. Electronic Signature(s) Signed: 05/29/2020 4:51:06 PM By: Linton Ham MD Entered By: Linton Ham on 05/29/2020 09:14:09 -------------------------------------------------------------------------------- Physician Orders Details Patient Name: Date of Service: [place], [place] 05/29/2020 8:15 A M Medical Record Number: [id number] Patient Account Number: [id number] Date of Birth/Sex: Treating RN: 1941-08-26 (79 y.o. 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 Dressing Change Frequency Other: - all wounds - change twice a week by home health Skin Barriers/Peri-Wound Care Moisturizing lotion Wound Cleansing May shower with protection. - use cast protectors on the days dressings are not changed. May shower and wash wound with soap and water. - with dressing changes only. Primary Wound Dressing Wound #3 Right,Proximal,Lateral Lower Leg Silver Collagen - moisten with hydrogel. Wound #4 Right,Distal,Lateral Lower Leg Silver Collagen - moisten with hydrogel. Secondary Dressing Dry Gauze - all wounds ABD pad - all wounds Other: - pad left medial first met head for protection. Edema Control 4 layer compression - Bilateral - ***PLEASE ENSURE TO WRAP BOTH LEGS FROM JUST BELOW THE KNEE TO FOOT .*** Avoid standing for long periods of time Elevate legs to the level of the heart or above for 30 minutes daily and/or when sitting, a frequency of: - throughout the day. Exercise regularly Support Garment 20-30 mm/Hg pressure to: - patient to purchase 20-70mg compression stockings. Bring in stockings next week to wound clinic. Additional Orders / Instructions Other: - Patient to go to the emergency department related to chest and abdominal issues. [facility]. -[facility] Electronic Signature(s) Signed: 05/29/2020 4:51:06 PM By: RLinton HamMD Signed: 05/29/2020 5:01:36 PM By: DDeon PillingEntered By: DDeon Pillingon 05/29/2020 08:58:57 -------------------------------------------------------------------------------- Problem List Details Patient Name: Date of Service: [place]9/30/2021 8:15 A M Medical Record Number: [id number] Account Number: [id number] of Birth/Sex: Treating RN: [id number](79y.o. MLorette Ang BMeta.RedingPrimary Care Provider: FFrederik Pear, [name] Other Clinician: Referring Provider: Treating Provider/Extender: RGerald Leitz, [name] in Treatment: 33 Active Problems ICD-10 Encounter Code Description Active Date MDM Diagnosis I87.323 Chronic venous hypertension (idiopathic) with inflammation of bilateral lower 10/11/2019 No Yes extremity I89.0 Lymphedema, not elsewhere classified 10/11/2019 No Yes L97.822 Non-pressure chronic ulcer of other part of left lower leg with fat layer exposed2/06/2020 No Yes L97.812 Non-pressure chronic ulcer of other part of right lower leg with fat layer 01/03/2020 No Yes exposed S90.812D Abrasion, left foot, subsequent encounter 02/07/2020 No Yes I42.9 Cardiomyopathy, unspecified 05/15/2020  No Yes K92.1 Melena 05/15/2020 No Yes Inactive Problems ICD-10 Code Description Active Date Inactive Date L97.521 Non-pressure chronic ulcer of other part of left foot limited to breakdown of skin 10/11/2019 10/11/2019 Resolved Problems ICD-10 Code Description Active Date Resolved Date 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 10/11/2019 10/11/2019 Electronic Signature(s) Signed: 05/29/2020 4:51:06 PM By: Linton Ham MD Entered By: Linton Ham on 05/29/2020 09:08:18 -------------------------------------------------------------------------------- Progress Note Details Patient Name: Date of Service: [place] 05/29/2020 8:15 A M Medical Record Number: [id number] Patient Account Number:  [id number] Date of Birth/Sex: Treating RN: [birth year] (79 y.o. [name], [name] Primary Care Provider: [name]., [name] Other Clinician: Referring Provider: Treating Provider/Extender: [name], [name] in Treatment: 33 Subjective History of Present Illness (HPI) ADMISSION 10/11/2019 This is a 79 year old man with a history of a severe cardiomyopathy with an ejection fraction of about 20%, chronic renal failure stage III. He is listed as a type II diabetic in epic although the patient denies this. He also has a history of PVD. He states for the last month he has had wounds on his bilateral lower extremities that started off as blisters which denuded. He has areas on the left lateral calf and 2 on the right lateral. He has an area on the left first met head which he did not know was there he we identified this on intake. He has been using Silvadene cream provided by his primary care physician but he is complaining that this burns. 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 He was acutely ill at that time complicating GI bleed. ABIs in our clinic were 1.16 on the right and 1.13 on the left 2/25; the patient comes in with his areas on the left lateral and right lateral calf. There is also an area over the left first MTP bunion deformity. We have been using Sorbact. His edema control is fairly good 3/4; left lateral and right lateral calf. Most of his wounds are in the same position tightly adherent nonviable debris. On the right we debrided the superior wound on the left both wounds. The area on his bunion over the  left first MTP medially is I think just about closed. We have been using Sorbact without a lot of success changed to Iodoflex under compression 3/11; left lateral and right lateral calf perhaps minor improvement in the surface condition. We have been using Iodoflex. The area over the bunion of the left first MTP has closed over 3/18; left lateral and right lateral calf not much improvement. We have been using Iodoflex. Aggressive debridement last week. 3/25; left lateral and right lateral calf. We have been using Iodoflex. There is some improvement in the superior area on the right and 2 on the lateral left although there is still a lot of debris on the surface. The inferior area on the right is still a completely nonviable surface. 4/8; left lateral and right lateral calfs. Essentially mirror-image looking wounds 2 wounds on each side in close juxtaposition we have been using Iodoflex with some improvement in the very adherent fibrinous debris but not a lot. The  patient has arterial studies next Wednesday morning and venous studies next Thursday morning. I have been avoiding any further aggressive debridement until we see the arterial study results. His ABIs were fairly good in this clinic and is dorsalis pedis pulses are palpable but the wound beds are pale. 4/15; left lateral and right lateral calfs. Essentially mirror-image wounds with 2 wounds on each side in close juxtaposition but with rims of separating normal tissue. We have been using Iodoflex. The base of the wounds has been cleaning up quite nicely ARTERIAL STUDIES were done showing the patient had an ABI on the right of 1.27 with triphasic waveforms and a TBI of 0.90 on the left triphasic waveforms with an ABI of 1.28 and a TBI of 0.87. No evidence of arterial disease VENOUS REFLUX STUDIES; were done yesterday we do not have this report yet. 4/23; VENOUS REFLUX STUDIES did not show any significant reflux right lower extremity. No  evidence of a DVT He did have significant reflux in the common . femoral vein on the left but nothing else was listed as significant. He did not have a DVT. We have been using Iodoflex under compression his wounds are making progress. 4/29; improvements in the wound surface continue. We have been using Iodoflex. He has a 20% out-of-pocket co-pay for Apligraf which is unfortunate. We changed him to Sorbact today 5/6; started on Sorbact last week. Better looking wound surface but not much change in dimensions. 01/24/20-Patient is back at 3 weeks, wound surfaces are about the same but very minimal slough on the right leg wounds, however there is blistering on both legs adjacent to the wounds, patient denies any other symptoms or new symptoms. His studies have been reviewed and do not indicate any significant arterial or venous disease. But he is attending once in 3 weeks with home health changing in between 6/3; patient has 4 wounds 2 on each side of his lateral lower legs. These wounds are somewhat improved. He apparently arrived in clinic last week with a large blister on the right lateral lower leg that had denuded into the wound. They changed to silver alginate. Still under compression. He has straight Medicare and unfortunately has an unlimited co-pay for advanced treatment options. 6/10; his original 4 wounds are all better especially on the left lateral lower leg. 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 6/24; blister on the right lateral lower leg has healed. There is no additional wounds. The traumatic left second toe is also closed. The 4 that he has  are all better 7/1; all of the patient's wounds are smaller except for the proximal one on the left lateral calf. We are using silver collagen 7/22; patient has home health. Relatively refractory wounds on the lateral part of both mid calfs. Each of them 2 wounds. The areas on the left are doing much better in fact the distal 1 looks like it is on his way to closure. The right required debridement with adherent fibrinous debris under illumination. We have been using silver collagen. Previous arterial studies were within normal limits he also had venous reflux studies 04/03/2020 upon evaluation today  patient appears to be doing excellent with regard to his lower extremity ulcers. He is going to require a little bit of debridement on the wounds on the right especially in order to clear away some biofilm and slough but this is minimal and overall he seems to be doing quite well. 8/19; the patient's wounds on the left lateral lower leg just above closed. However the 2 on the lateral part of the right have a nonviable necrotic surface. This is disappointing. There is also no change in dimensions. He has had arterial studies as well as venous reflux studies 8/26; very disappointing today. Even the more superficial areas on the left are about the same as last week. Still 2 punched-out areas completely unchanged on the right. We have been using silver collagen really making no progress. 9/2; changed to Iodoflex last week better looking wound surfaces especially on the right although they are deep and punched out. The areas on the left are more superficial open 1 of these is almost fully epithelialized although it has been this way for at least 2 weeks. He has a 20% co-pay [Medicare only] unaffordable for a skin substitute. Had some thought about a snap VAC on the deep areas on the right leg we will try to put this through in the insurance 9/9; punched-out wounds on the bilateral lateral lower extremities. We  have been operating as if the these were chronic venous wounds with secondary lymphedema. The areas on the left have been doing well in fact one of them is closed over. We have had no improvement in the areas on the right we have been using Iodoflex to help with surface debridement. The areas on the right are deeper wider. I do not see any evidence of infection. Home health is not been putting the wraps on high enough he has localized lymphedema right above the wounds. He has had arterial and venous studies already 9/16; punched-out mirror-image wounds on his bilateral lateral mid calfs. We have 1 closed on the left lateral the other smaller. For the first time today the areas on the right lateral actually looks some better. I did a biopsy of 1 of these last time although we still do not have this result. HOWEVER he arrives in clinic today complaining of chest pain overnight which felt like a brick on his chest. He also had 2 black watery bowel movements. He does not have nausea or vomiting. He is not describing abdominal pain. He has no prior history of diarrhea heartburn etc. 9/30; sent this patient to the ER on 9/16. 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 He is on twice daily PPIs Protonix. His admission hemoglobin was exceptionally low at 5. 8.. Discharged at 8.1. 4 unit transfusion. 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 His diuretic dose was actually decreased because of elevated creatinine currently at torsemide 20 mg not felt to be a Coumadin candidate. Patient states he feels well. The areas on his left leg are healed. The areas on the right look better. These have been very refractory wounds. He does not have stockings we will make arrangements for this today Objective Constitutional Sitting or standing Blood Pressure is within target range for patient.. Pulse regular and within  target range for patient.Marland Kitchen Respirations regular, non-labored and within target range.. Temperature is normal and within the target range for the patient.Marland Kitchen Appears in no  distress. Vitals Time Taken: 8:30 AM, Height: 71 in, Weight: 198 lbs, BMI: 27.6, Temperature: 97.7 F, Pulse: 84 bpm, Respiratory Rate: 20 breaths/min, Blood Pressure: 117/66 mmHg. Respiratory work of breathing is normal. Cardiovascular Needle pulses are palpable. We have excellent edema control on both sides. General Notes: Wound exam; the area on the left is totally healed. On the right lateral calf the proximal 1 look at it looks as though it is progressing towards closure and the more distal lateral wound looks better. Integumentary (Hair, Skin) Wound #3 status is Open. Original cause of wound was Blister. The wound is located on the Right,Proximal,Lateral Lower Leg. The wound measures 1.6cm length x 0.6cm width x 0.1cm depth; 0.754cm^2 area and 0.075cm^3 volume. There is Fat Layer (Subcutaneous Tissue) exposed. There is no tunneling or undermining noted. There is a medium amount of serosanguineous drainage noted. The wound margin is distinct with the outline attached to the wound base. There is large (67-100%) red, pink granulation within the wound bed. There is a small (1-33%) amount of necrotic tissue within the wound bed including Adherent Slough. Wound #4 status is Open. Original cause of wound was Blister. The wound is located on the Right,Distal,Lateral Lower Leg. The wound measures 2.4cm length x 1.5cm width x 0.6cm depth; 2.827cm^2 area and 1.696cm^3 volume. There is Fat Layer (Subcutaneous Tissue) exposed. There is tunneling at 12:00 with a maximum distance of 1.5cm. There is a medium amount of serosanguineous drainage noted. The wound margin is well defined and not attached to the wound base. There is large (67-100%) red, pink granulation within the wound bed. There is a small (1-33%) amount of necrotic tissue  within the wound bed including Adherent Slough. Wound #6 status is Healed - Epithelialized. Original cause of wound was Gradually Appeared. The wound is located on the Left,Proximal,Lateral Lower Leg. The wound measures 0cm length x 0cm width x 0cm depth; 0cm^2 area and 0cm^3 volume. There is no tunneling or undermining noted. There is a none present amount of drainage noted. The wound margin is distinct with the outline attached to the wound base. There is no granulation within the wound bed. There is no necrotic tissue within the wound bed. Assessment Active Problems ICD-10 Chronic venous hypertension (idiopathic) with inflammation of bilateral lower extremity Lymphedema, not elsewhere classified Non-pressure chronic ulcer of other part of left lower leg with fat layer exposed Non-pressure chronic ulcer of other part of right lower leg with fat layer exposed Abrasion, left foot, subsequent encounter Cardiomyopathy, unspecified [facility] Procedures Wound #3 Pre-procedure diagnosis of Wound #3 is a Venous Leg Ulcer located on the Right,Proximal,Lateral Lower Leg . There was a Four Layer Compression Therapy Procedure with a pre-treatment ABI of 1.1 by Baruch Gouty, RN. Post procedure Diagnosis Wound #3: Same as Pre-Procedure Wound #4 Pre-procedure diagnosis of Wound #4 is a Venous Leg Ulcer located on the Right,Distal,Lateral Lower Leg . There was a Four Layer Compression Therapy Procedure with a pre-treatment ABI of 1.1 by Baruch Gouty, RN. Post procedure Diagnosis Wound #4: Same as Pre-Procedure Plan Follow-up Appointments: Return Appointment in 1 week. Dressing Change Frequency: Other: - all wounds - change twice a week by home health Skin Barriers/Peri-Wound Care: Moisturizing lotion Wound Cleansing: May shower with protection. - use cast protectors on the days dressings are not changed. May shower and wash wound with soap and water. - with dressing changes only. Primary  Wound Dressing: Wound #3 Right,Proximal,Lateral Lower Leg: Silver Collagen - moisten with hydrogel. Wound #4 Right,Distal,Lateral Lower  Leg: Silver Collagen - moisten with hydrogel. Secondary Dressing: Dry Gauze - all wounds ABD pad - all wounds Other: - pad left medial first met head for protection. Edema Control: 4 layer compression - Bilateral - ***PLEASE ENSURE TO WRAP BOTH LEGS FROM JUST BELOW THE KNEE TO FOOT .*** Avoid standing for long periods of time Elevate legs to the level of the heart or above for 30 minutes daily and/or when sitting, a frequency of: - throughout the day. Exercise regularly Support Garment 20-30 mm/Hg pressure to: - patient to purchase 20-100mg compression stockings. Bring in stockings next week to wound clinic. Additional Orders / Instructions: Other: - Patient to go to the emergency department related to chest and abdominal issues. [facility] - [facility]. [facility] [place] [address] I will need to asked the patient to make sure he follows up there he was supposed to have a BMP and CBC. His cardiologist is Dr. MCandee Furbish 2. We are continuing with the silver collagen under 4-layer compression on the right. 3. Because of danger of imminent skin breakdown we will wrap the left leg also in 4 layer compression 4. We gave him his measurements for 20/30 below-knee stockings. Without a precise transition from external wraps to stockings this man skin will breakdown again. All look forward to transitioning the left leg into a stocking next week. 5. Next week I am going to have to ask him about his lab work and whether he followed up with his primary doctor. He generally appeared better today. Electronic Signature(s) Signed: 05/29/2020 4:51:06 PM By: RLinton HamMD Entered By: RLinton Hamon 05/29/2020  09:16:27 -------------------------------------------------------------------------------- SuperBill Details Patient Name: [facility] [place] [facility] Medical Record Number: [id number] Account Number: [id number] of Birth/Sex: Treating RN: 108/21/1942(79y.o. MLorette Ang BMeta.RedingPrimary Care Provider: FFrederik Pear, [name] Other Clinician: Referring Provider: Treating Provider/Extender: RGerald Leitz, [name] in Treatment: 33 Diagnosis Coding ICD-10 Codes Code Description [id number] venous hypertension (idiopathic) with inflammation of bilateral lower extremity I89.0 Lymphedema, not elsewhere classified L97.822 Non-pressure chronic ulcer of other part of left lower leg with fat layer exposed L97.812 Non-pressure chronic ulcer of other part of right lower leg with fat layer exposed S90.812D Abrasion, left foot, subsequent encounter I42.9 Cardiomyopathy, unspecified K92.1 [facility] Facility Procedures The patient participates with Medicare or their insurance follows the Medicare Facility Guidelines: CPT4 Description Modifier Quantity Code [id number]: Application of multi-layer venous compression system; leg (below knee), including ankle and 1 foot. Physician Procedures : CPT4 Code Description Modifier [id number] WC PHYS LEVEL 4 - EST PT ICD-10 Diagnosis Description I87.323 Chronic venous hypertension (idiopathic) with inflammation of bilateral lower extremity I89.0 Lymphedema, not elsewhere classified L97.822  Non-pressure chronic ulcer of other part of left lower leg with fat layer exposed L97.812 Non-pressure chronic ulcer of other part of right lower leg with fat layer exposed Quantity: 1 Electronic Signature(s) Signed: 05/29/2020 4:51:06 PM By: RLinton HamMD Entered By: RLinton Hamon 05/29/2020 09:16:49

## 2020-06-05 ENCOUNTER — Encounter (HOSPITAL_BASED_OUTPATIENT_CLINIC_OR_DEPARTMENT_OTHER): Payer: Medicare Other | Attending: Internal Medicine | Admitting: Internal Medicine

## 2020-06-05 DIAGNOSIS — E11621 Type 2 diabetes mellitus with foot ulcer: Secondary | ICD-10-CM | POA: Diagnosis not present

## 2020-06-05 DIAGNOSIS — I132 Hypertensive heart and chronic kidney disease with heart failure and with stage 5 chronic kidney disease, or end stage renal disease: Secondary | ICD-10-CM | POA: Diagnosis not present

## 2020-06-05 DIAGNOSIS — E1122 Type 2 diabetes mellitus with diabetic chronic kidney disease: Secondary | ICD-10-CM | POA: Insufficient documentation

## 2020-06-05 DIAGNOSIS — L97812 Non-pressure chronic ulcer of other part of right lower leg with fat layer exposed: Secondary | ICD-10-CM | POA: Diagnosis not present

## 2020-06-05 DIAGNOSIS — I509 Heart failure, unspecified: Secondary | ICD-10-CM | POA: Diagnosis not present

## 2020-06-05 DIAGNOSIS — I429 Cardiomyopathy, unspecified: Secondary | ICD-10-CM | POA: Diagnosis not present

## 2020-06-05 DIAGNOSIS — E1151 Type 2 diabetes mellitus with diabetic peripheral angiopathy without gangrene: Secondary | ICD-10-CM | POA: Insufficient documentation

## 2020-06-05 DIAGNOSIS — L97822 Non-pressure chronic ulcer of other part of left lower leg with fat layer exposed: Secondary | ICD-10-CM | POA: Insufficient documentation

## 2020-06-05 DIAGNOSIS — N186 End stage renal disease: Secondary | ICD-10-CM | POA: Diagnosis not present

## 2020-06-05 DIAGNOSIS — Z7901 Long term (current) use of anticoagulants: Secondary | ICD-10-CM | POA: Diagnosis not present

## 2020-06-05 DIAGNOSIS — I890 Lymphedema, not elsewhere classified: Secondary | ICD-10-CM | POA: Diagnosis not present

## 2020-06-06 NOTE — Progress Notes (Signed)
PROSPER, PAFF ([id number]) Visit Report for 06/05/2020 HPI Details Patient Name: Date of Service: [place] 06/05/2020 8:00 [place] Record Number: [id number] Patient Account Number: [id number] Date of Birth/Sex: Treating RN: 05/27/1941 (79 y.o. [name], [name] Primary Care Provider: [name]., [name] Other Clinician: Referring Provider: Treating Provider/Extender: [name], [name] in Treatment: 46 History of Present Illness HPI Description: ADMISSION 10/11/2019 This is a 79 year old man with a history of a severe cardiomyopathy with an ejection fraction of about 20%, chronic renal failure stage III. He is listed as a type II diabetic in epic although the patient denies this. He also has a history of PVD. He states for the last month he has had wounds on his bilateral lower extremities that started off as blisters which denuded. He has areas on the left lateral calf and 2 on the right lateral. He has an area on the left first met head which he did not know was there he we identified this on intake. He has been using Silvadene cream provided by his primary care physician but he is complaining that this burns. 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 He was acutely ill at that time complicating GI bleed. ABIs in our clinic were 1.16 on the right and 1.13 on the left 2/25; the patient comes in with his areas on the left lateral and right lateral calf. There is also an area over the left first MTP bunion deformity. We have been using Sorbact. His edema control is fairly good 3/4; left lateral and  right lateral calf. Most of his wounds are in the same position tightly adherent nonviable debris. On the right we debrided the superior wound on the left both wounds. The area on his bunion over the left first MTP medially is I think just about closed. We have been using Sorbact without a lot of success changed to Iodoflex under compression 3/11; left lateral and right lateral calf perhaps minor improvement in the surface condition. We have been using Iodoflex. The area over the bunion of the left first MTP has closed over 3/18; left lateral and right lateral calf not much improvement. We have been using Iodoflex. Aggressive debridement last week. 3/25; left lateral and right lateral calf. We have been using Iodoflex. There is some improvement in the superior area on the right and 2 on the lateral left although there is still a lot of debris on the surface. The inferior area on the right is still a completely nonviable surface. 4/8; left lateral and right lateral calfs. Essentially mirror-image looking wounds 2 wounds on each side in close juxtaposition we have been using Iodoflex with some improvement in the very adherent fibrinous debris but not a lot. The patient has arterial studies next Wednesday morning and venous studies next Thursday morning. I have been avoiding any further aggressive debridement until we see the arterial study results. His ABIs were fairly good in this clinic and is dorsalis pedis pulses are palpable but the wound beds are pale. 4/15; left lateral and right lateral calfs. Essentially mirror-image wounds with 2 wounds on each side in close juxtaposition  but with rims of separating normal tissue. We have been using Iodoflex. The base of the wounds has been cleaning up quite nicely ARTERIAL STUDIES were done showing the patient had an ABI on the right of 1.27 with triphasic waveforms and a TBI of 0.90 on the left triphasic waveforms with an ABI of 1.28 and a TBI of 0.87. No  evidence of arterial disease VENOUS REFLUX STUDIES; were done yesterday we do not have this report yet. 4/23; VENOUS REFLUX STUDIES did not show any significant reflux right lower extremity. No evidence of a DVT He did have significant reflux in the common . femoral vein on the left but nothing else was listed as significant. He did not have a DVT. We have been using Iodoflex under compression his wounds are making progress. 4/29; improvements in the wound surface continue. We have been using Iodoflex. He has a 20% out-of-pocket co-pay for Apligraf which is unfortunate. We changed him to Sorbact today 5/6; started on Sorbact last week. Better looking wound surface but not much change in dimensions. 01/24/20-Patient is back at 3 weeks, wound surfaces are about the same but very minimal slough on the right leg wounds, however there is blistering on both legs adjacent to the wounds, patient denies any other symptoms or new symptoms. His studies have been reviewed and do not indicate any significant arterial or venous disease. But he is attending once in 3 weeks with home health changing in between 6/3; patient has 4 wounds 2 on each side of his lateral lower legs. These wounds are somewhat improved. He apparently arrived in clinic last week with a large blister on the right lateral lower leg that had denuded into the wound. They changed to silver alginate. Still under compression. He has straight Medicare and unfortunately has an unlimited co-pay for advanced treatment options. 6/10; his original 4 wounds are all better especially on the left lateral lower leg. 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 6/24; blister on the right lateral lower leg has healed. There is no additional wounds. The traumatic left second toe is also closed. The 4 that he has are all better 7/1; all of the patient's wounds are smaller except for the proximal one on the left lateral calf. We are using silver collagen 7/22; patient has home health. Relatively refractory wounds on the lateral part of both mid calfs. Each of them 2 wounds. The areas on the left are doing much better in fact the distal 1 looks like it is on his way to closure. The right required debridement with adherent fibrinous debris under illumination. We have been using silver collagen. Previous arterial studies were within normal limits he also had venous reflux studies 04/03/2020 upon evaluation today patient appears to be doing excellent with regard to his lower extremity ulcers. He is going to require a little bit of debridement on the wounds on the right especially in order to clear away some biofilm and slough but this is minimal and overall he seems to be doing quite well. 8/19; the patient's wounds on the left lateral lower leg just above closed. However the 2  on the lateral part of the right have a nonviable necrotic surface. This is disappointing. There is also no change in dimensions. He has had arterial studies as well as venous reflux studies 8/26; very disappointing today. Even the more superficial areas on the left are about the same as last week. Still 2 punched-out areas completely unchanged on the right. We have been using silver collagen really making no progress. 9/2; changed to Iodoflex last week better looking wound surfaces especially on the right although they are deep and punched out. The areas on the left are more superficial open 1 of these is almost fully epithelialized although it has been this way for at least 2 weeks. He has a 20% co-pay [Medicare only] unaffordable for a skin  substitute. Had some thought about a snap VAC on the deep areas on the right leg we will try to put this through in the insurance 9/9; punched-out wounds on the bilateral lateral lower extremities. We have been operating as if the these were chronic venous wounds with secondary lymphedema. The areas on the left have been doing well in fact one of them is closed over. We have had no improvement in the areas on the right we have been using Iodoflex to help with surface debridement. The areas on the right are deeper wider. I do not see any evidence of infection. Home health is not been putting the wraps on high enough he has localized lymphedema right above the wounds. He has had arterial and venous studies already 9/16; punched-out mirror-image wounds on his bilateral lateral mid calfs. We have 1 closed on the left lateral the other smaller. For the first time today the areas on the right lateral actually looks some better. I did a biopsy of 1 of these last time although we still do not have this result. HOWEVER he arrives in clinic today complaining of chest pain overnight which felt like a brick on his chest. He also had 2 black watery bowel movements. He does not have nausea or vomiting. He is not describing abdominal pain. He has no prior history of diarrhea heartburn etc. 9/30; sent this patient to the ER on 9/16. 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 He is on twice daily PPIs Protonix. His admission hemoglobin was exceptionally low at 5. 8.. Discharged at 8.1. 4 unit transfusion. 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 His diuretic dose was actually decreased because of elevated creatinine currently at torsemide 20 mg not felt to be a Coumadin candidate. Patient states he feels well. The areas on his left leg are healed. The areas on the right look better. These have been very refractory  wounds. He does not have stockings we will make arrangements for this today 10/7; the areas on the left leg are healed we will transition him into his 20/30 stocking today. Continued improvement in the areas on the right lateral leg Electronic Signature(s) Signed: 06/06/2020 8:12:45 AM By: Linton Ham MD Entered By: Linton Ham on 06/05/2020 09:27:25 -------------------------------------------------------------------------------- Physical Exam Details Patient Name: Date of Service: [place] 06/05/2020 8:00 [place] Record Number: [id number] Patient Account Number: [id number] Date of Birth/Sex: Treating RN: [birth year] (79 y.o. [name], [name] Primary Care Provider: [name]., [name] Other Clinician: Referring Provider: Treating Provider/Extender: [name], [name] in Treatment: 50 Constitutional Sitting  or standing Blood Pressure is within target range for patient.. Pulse regular and within target range for patient.Marland Kitchen Respirations regular, non-labored and within target range.. Temperature is normal and within the target range for the patient.Marland Kitchen Appears in no distress. Cardiovascular Pedal pulses are palpable. We have excellent edema control. Notes Wound exam; the area on the left is totally healed. On the right is 2 wound areas look better especially superiorly. There is rims of epithelialization no debridement is required Electronic Signature(s) Signed: 06/06/2020 8:12:45 AM By: Linton Ham MD Entered By: Linton Ham on 06/05/2020 09:28:17 -------------------------------------------------------------------------------- Physician Orders Details Patient Name: Date of Service: [place] 06/05/2020 8:00 [place] Number: [id number] Patient Account Number: [id number] Date of Birth/Sex: Treating RN: 1940/12/12 (79 y.o. 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 Dressing Change Frequency Other: - all wounds - change twice a week by home health Skin Barriers/Peri-Wound Care Moisturizing lotion - patient to lotion left leg daily. lotion right leg with dressing changes. Wound Cleansing May shower with protection. - use cast protectors on the days dressings are not changed. May shower and wash wound with soap and water. - with dressing changes only. Primary Wound Dressing Wound #3 Right,Proximal,Lateral Lower Leg Silver Collagen - moisten with hydrogel. Wound #4 Right,Distal,Lateral Lower Leg Silver Collagen - moisten with hydrogel. Secondary Dressing Dry Gauze - all wounds ABD pad - all wounds Other: - pad left medial first met head for protection. Edema Control 4 layer compression - Right Lower Extremity Avoid standing for long periods of time Elevate legs to the level of the heart or above for 30 minutes daily and/or when sitting, a frequency of: - throughout the day. Exercise regularly Support Garment 20-30 mm/Hg pressure to: - patient to wear compression stocking to left leg. Apply in the morning and remove at night. [facility] for wound care. [facility] Electronic Signature(s) Signed: 06/05/2020 6:22:31 PM By: Deon Pilling Signed: 06/06/2020 8:12:45  AM By: Linton Ham MD Entered By: Deon Pilling on 06/05/2020 08:39:53 -------------------------------------------------------------------------------- Problem List Details Patient Name: Date of Service: [place] 06/05/2020 8:00 [place] Record Number: [id number] Patient Account Number: [id number] Date of Birth/Sex: Treating RN: 04-15-1941 (79 y.o. 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/06/2020 No Yes L97.812 Non-pressure chronic ulcer of other part of right lower leg with fat layer 01/03/2020 No Yes exposed S90.812D Abrasion, left foot, subsequent encounter 02/07/2020 No Yes I42.9 Cardiomyopathy, unspecified 05/15/2020 No Yes K92.1 [place] 05/15/2020  No Yes Inactive Problems ICD-10 Code Description Active Date Inactive Date L97.521 Non-pressure chronic ulcer of other part of left foot limited to breakdown of skin 10/11/2019 10/11/2019 Resolved Problems ICD-10 Code Description Active Date Resolved Date 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 10/11/2019 10/11/2019 Electronic Signature(s) Signed: 06/06/2020 8:12:45 AM By: Linton Ham MD Entered By: Linton Ham on 06/05/2020 09:25:35 -------------------------------------------------------------------------------- Progress Note Details Patient Name: Date of Service: [place] 06/05/2020 8:00 [place] Record Number: [id number] Patient Account Number: [id number] Date of Birth/Sex: Treating  RN: 28-Nov-1940 (79 y.o. [name], [name] Primary Care Provider: [name]., [name] Other Clinician: Referring Provider: Treating Provider/Extender: [name], [name] in Treatment: 34 Subjective History of Present Illness (HPI) ADMISSION 10/11/2019 This is a 79 year old man with a history of a severe cardiomyopathy with an ejection fraction of about 20%, chronic renal failure stage III. He is listed as a type II diabetic in epic although the patient denies this. He also has a history of PVD. He states for the last month he has had wounds on his bilateral lower extremities that started off as blisters which denuded. He has areas on the left lateral calf and 2 on the right lateral. He has an area on the left first met head which he did not know was there he we identified this on intake. He has been using Silvadene cream provided by his primary care physician but he is complaining that this burns. 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 He was acutely ill at that time complicating GI bleed. ABIs in our clinic were 1.16 on the right and 1.13 on the left 2/25; the patient comes in with his areas on the left lateral and right lateral calf. There is also an area over the left first MTP bunion deformity. We have been using Sorbact. His edema control is fairly good 3/4; left lateral and right lateral calf. Most of his wounds are in the same position tightly adherent nonviable debris. On the right we debrided the superior wound on the left both wounds. The area on his bunion over the left first MTP medially is I think just  about closed. We have been using Sorbact without a lot of success changed to Iodoflex under compression 3/11; left lateral and right lateral calf perhaps minor improvement in the surface condition. We have been using Iodoflex. The area over the bunion of the left first MTP has closed over 3/18; left lateral and right lateral calf not much improvement. We have been using Iodoflex. Aggressive debridement last week. 3/25; left lateral and right lateral calf. We have been using Iodoflex. There is some improvement in the superior area on the right and 2 on the lateral left although there is still a lot of debris on the surface. The inferior area on the right is still a completely nonviable surface. 4/8; left lateral and right lateral calfs. Essentially mirror-image looking wounds 2 wounds on each side in close juxtaposition we have been using Iodoflex with some improvement in the very adherent fibrinous debris but not a lot. The patient has arterial studies next  Wednesday morning and venous studies next Thursday morning. I have been avoiding any further aggressive debridement until we see the arterial study results. His ABIs were fairly good in this clinic and is dorsalis pedis pulses are palpable but the wound beds are pale. 4/15; left lateral and right lateral calfs. Essentially mirror-image wounds with 2 wounds on each side in close juxtaposition but with rims of separating normal tissue. We have been using Iodoflex. The base of the wounds has been cleaning up quite nicely ARTERIAL STUDIES were done showing the patient had an ABI on the right of 1.27 with triphasic waveforms and a TBI of 0.90 on the left triphasic waveforms with an ABI of 1.28 and a TBI of 0.87. No evidence of arterial disease VENOUS REFLUX STUDIES; were done yesterday we do not have this report yet. 4/23; VENOUS REFLUX STUDIES did not show any significant reflux right lower extremity. No evidence of a DVT He did have significant  reflux in the common . femoral vein on the left but nothing else was listed as significant. He did not have a DVT. We have been using Iodoflex under compression his wounds are making progress. 4/29; improvements in the wound surface continue. We have been using Iodoflex. He has a 20% out-of-pocket co-pay for Apligraf which is unfortunate. We changed him to Sorbact today 5/6; started on Sorbact last week. Better looking wound surface but not much change in dimensions. 01/24/20-Patient is back at 3 weeks, wound surfaces are about the same but very minimal slough on the right leg wounds, however there is blistering on both legs adjacent to the wounds, patient denies any other symptoms or new symptoms. His studies have been reviewed and do not indicate any significant arterial or venous disease. But he is attending once in 3 weeks with home health changing in between 6/3; patient has 4 wounds 2 on each side of his lateral lower legs. These wounds are somewhat improved. He apparently arrived in clinic last week with a large blister on the right lateral lower leg that had denuded into the wound. They changed to silver alginate. Still under compression. He has straight Medicare and unfortunately has an unlimited co-pay for advanced treatment options. 6/10; his original 4 wounds are all better especially on the left lateral lower leg. 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 6/24; blister on the right lateral lower leg has healed. There is no additional wounds. The traumatic left second toe is also closed. The 4 that he has are all better 7/1; all of the patient's  wounds are smaller except for the proximal one on the left lateral calf. We are using silver collagen 7/22; patient has home health. Relatively refractory wounds on the lateral part of both mid calfs. Each of them 2 wounds. The areas on the left are doing much better in fact the distal 1 looks like it is on his way to closure. The right required debridement with adherent fibrinous debris under illumination. We have been using silver collagen. Previous arterial studies were within normal limits he also had venous reflux studies 04/03/2020 upon evaluation today patient appears to be doing  excellent with regard to his lower extremity ulcers. He is going to require a little bit of debridement on the wounds on the right especially in order to clear away some biofilm and slough but this is minimal and overall he seems to be doing quite well. 8/19; the patient's wounds on the left lateral lower leg just above closed. However the 2 on the lateral part of the right have a nonviable necrotic surface. This is disappointing. There is also no change in dimensions. He has had arterial studies as well as venous reflux studies 8/26; very disappointing today. Even the more superficial areas on the left are about the same as last week. Still 2 punched-out areas completely unchanged on the right. We have been using silver collagen really making no progress. 9/2; changed to Iodoflex last week better looking wound surfaces especially on the right although they are deep and punched out. The areas on the left are more superficial open 1 of these is almost fully epithelialized although it has been this way for at least 2 weeks. He has a 20% co-pay [Medicare only] unaffordable for a skin substitute. Had some thought about a snap VAC on the deep areas on the right leg we will try to put this through in the insurance 9/9; punched-out wounds on the bilateral lateral lower extremities. We have been operating as if the these were  chronic venous wounds with secondary lymphedema. The areas on the left have been doing well in fact one of them is closed over. We have had no improvement in the areas on the right we have been using Iodoflex to help with surface debridement. The areas on the right are deeper wider. I do not see any evidence of infection. Home health is not been putting the wraps on high enough he has localized lymphedema right above the wounds. He has had arterial and venous studies already 9/16; punched-out mirror-image wounds on his bilateral lateral mid calfs. We have 1 closed on the left lateral the other smaller. For the first time today the areas on the right lateral actually looks some better. I did a biopsy of 1 of these last time although we still do not have this result. HOWEVER he arrives in clinic today complaining of chest pain overnight which felt like a brick on his chest. He also had 2 black watery bowel movements. He does not have nausea or vomiting. He is not describing abdominal pain. He has no prior history of diarrhea heartburn etc. 9/30; sent this patient to the ER on 9/16. 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 He is on twice daily PPIs Protonix. His admission hemoglobin was exceptionally low at 5. 8.. Discharged at 8.1. 4 unit transfusion. 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 His diuretic dose was actually decreased because of elevated creatinine currently at torsemide 20 mg not felt to be a Coumadin candidate. Patient states he feels well. The areas on his left leg are healed. The areas on the right look better. These have been very refractory wounds. He does not have stockings we will make arrangements for this today 10/7; the areas on the left leg are healed we will transition him into his 20/30 stocking today. Continued improvement in the areas on the right lateral  leg Objective Constitutional Sitting or standing Blood Pressure is within target range for patient.. Pulse regular and within target range  for patient.Marland Kitchen Respirations regular, non-labored and within target range.. Temperature is normal and within the target range for the patient.Marland Kitchen Appears in no distress. Vitals Time Taken: 7:50 AM, Height: 71 in, Weight: 198 lbs, BMI: 27.6, Temperature: 97.6 F, Pulse: 86 bpm, Respiratory Rate: 19 breaths/min, Blood Pressure: 122/74 mmHg. Cardiovascular Pedal pulses are palpable. We have excellent edema control. General Notes: Wound exam; the area on the left is totally healed. On the right is 2 wound areas look better especially superiorly. There is rims of epithelialization no debridement is required Integumentary (Hair, Skin) Wound #3 status is Open. Original cause of wound was Blister. The wound is located on the Right,Proximal,Lateral Lower Leg. The wound measures 1.1cm length x 0.8cm width x 0.1cm depth; 0.691cm^2 area and 0.069cm^3 volume. There is Fat Layer (Subcutaneous Tissue) exposed. There is no tunneling or undermining noted. There is a medium amount of serosanguineous drainage noted. The wound margin is distinct with the outline attached to the wound base. There is large (67-100%) red, pink granulation within the wound bed. There is a small (1-33%) amount of necrotic tissue within the wound bed including Adherent Slough. Wound #4 status is Open. Original cause of wound was Blister. The wound is located on the Right,Distal,Lateral Lower Leg. The wound measures 2cm length x 1.9cm width x 0.3cm depth; 2.985cm^2 area and 0.895cm^3 volume. There is Fat Layer (Subcutaneous Tissue) exposed. There is no tunneling or undermining noted. There is a medium amount of serosanguineous drainage noted. The wound margin is well defined and not attached to the wound base. There is large (67- 100%) red, pink granulation within the wound bed. There is a small (1-33%)  amount of necrotic tissue within the wound bed including Adherent Slough. Assessment Active Problems ICD-10 Chronic venous hypertension (idiopathic) with inflammation of bilateral lower extremity Lymphedema, not elsewhere classified Non-pressure chronic ulcer of other part of left lower leg with fat layer exposed Non-pressure chronic ulcer of other part of right lower leg with fat layer exposed Abrasion, left foot, subsequent encounter Cardiomyopathy, unspecified Melena Procedures Wound #3 Pre-procedure diagnosis of Wound #3 is a Venous Leg Ulcer located on the Right,Proximal,Lateral Lower Leg . There was a Four Layer Compression Therapy Procedure by Zenaida Deed, RN. Post procedure Diagnosis Wound #3: Same as Pre-Procedure Wound #4 Pre-procedure diagnosis of Wound #4 is a Venous Leg Ulcer located on the Right,Distal,Lateral Lower Leg . There was a Four Layer Compression Therapy Procedure by Zenaida Deed, RN. Post procedure Diagnosis Wound #4: Same as Pre-Procedure Plan Follow-up Appointments: Return Appointment in 2 weeks. Dressing Change Frequency: Other: - all wounds - change twice a week by home health Skin Barriers/Peri-Wound Care: Moisturizing lotion - patient to lotion left leg daily. lotion right leg with dressing changes. Wound Cleansing: May shower with protection. - use cast protectors on the days dressings are not changed. May shower and wash wound with soap and water. - with dressing changes only. Primary Wound Dressing: Wound #3 Right,Proximal,Lateral Lower Leg: Silver Collagen - moisten with hydrogel. Wound #4 Right,Distal,Lateral Lower Leg: Silver Collagen - moisten with hydrogel. Secondary Dressing: Dry Gauze - all wounds ABD pad - all wounds Other: - pad left medial first met head for protection. Edema Control: 4 layer compression - Right Lower Extremity Avoid standing for long periods of time Elevate legs to the level of the heart or above for 30  minutes daily and/or when sitting, a frequency of: - throughout the day. Exercise regularly Support Garment 20-30 mm/Hg pressure to: - patient to  wear compression stocking to left leg. Apply in the morning and remove at night. Home Health: [place] skilled [facility] for wound care. - [facility] we continued with silver collagen to the areas on the right lateral lower leg ABDs under 4-layer compression 2. We transitioned him into a 20/30 below-knee stocking on the left leg. We will have to be vigilant about this to see if this is enough compression to maintain his skin integrity vis--vis the swelling 3. He has his son at home to help him put on the stocking Electronic Signature(s) Signed: 06/06/2020 8:12:45 AM By: Linton Ham MD Entered By: Linton Ham on 06/05/2020 89:21:19 -------------------------------------------------------------------------------- SuperBill Details Patient Name: Date of Service: [place] 06/05/2020 Medical Record Number: [id number] Patient Account Number: [id number] Date of Birth/Sex: Treating RN: Jan 03, 1941 (79 y.o. [name], [name] Primary Care Provider: [name]., [name] Other Clinician: Referring Provider: Treating Provider/Extender: [name], [name] in Treatment: 34 Diagnosis Coding ICD-10 Codes Code Description [phone] Chronic venous hypertension (idiopathic) with inflammation of bilateral lower extremity I89.0 Lymphedema, not elsewhere classified L97.822 Non-pressure chronic ulcer of other part of left lower leg with fat layer exposed L97.812 Non-pressure chronic ulcer of other part of right lower leg with fat layer exposed S90.812D Abrasion, left foot, subsequent encounter I42.9 Cardiomyopathy, unspecified K92.1 Melena Facility Procedures The patient participates with Medicare or their insurance follows the Medicare Facility Guidelines: CPT4 Code Description Modifier Quantity  [id number] (Facility Use Only) [phone] - APPLY [place] RT LEG 1 Physician Procedures : CPT4 Code Description Modifier [id number] - WC PHYS LEVEL 3 - EST PT ICD-10 Diagnosis Description I87.323 Chronic venous hypertension (idiopathic) with inflammation of bilateral lower extremity L97.812 Non-pressure chronic ulcer of other part of  right lower leg with fat layer exposed Quantity: 1 Electronic Signature(s) Signed: 06/06/2020 8:12:45 AM By: Linton Ham MD Entered By: Linton Ham on 06/05/2020 09:29:48

## 2020-06-06 NOTE — Progress Notes (Signed)
[name] ([id number]) Visit Report for 06/05/2020 Arrival Information Details Patient Name: Date of Service: [name], [name] 06/05/2020 8:00 [place] Record Number: [id number] Patient Account Number: [id number] Date of Birth/Sex: Treating Patton: 10-28-1940 (79 y.o. [name] Primary Care [name]: Troy Pear., [name] Other Clinician: Referring Dennisse Swader: Treating [name]/Extender: Troy Leitz., [name] in Treatment: 51 Visit Information History Since Last Visit Added or deleted any medications: No Patient Arrived: Cane Any new allergies or adverse reactions: No Arrival Time: 08:02 Had a fall or experienced change in No Accompanied By: self activities of daily living that may affect Transfer Assistance: None risk of falls: Patient Identification Verified: Yes Signs or symptoms of abuse/neglect since last visito No Secondary Verification Process Completed: Yes Hospitalized since last visit: No Patient Requires Transmission-Based Precautions: No Implantable device outside of the clinic excluding No Patient Has Alerts: Yes cellular tissue based products placed in the center Patient Alerts: Patient on Blood Thinner since last visit: Has Dressing in Place as Prescribed: Yes Has Compression in Place as Prescribed: Yes Pain Present Now: No Electronic Signature(s) Signed: 06/05/2020 5:54:48 PM By: [name] Entered By: [name] on 06/05/2020 08:02:27 -------------------------------------------------------------------------------- Compression Therapy Details Patient Name: Date of Service: [name] 06/05/2020 8:00 [place] Record Number: [id number] Patient Account Number: [id number] Date of Birth/Sex: Treating Patton: [birth year] (79 y.o. [name] Primary Care Aleathia Purdy: Troy Pear., [name] Other Clinician: Referring Elenna Spratling: Treating Modena Patton/Extender: Troy Leitz., [name] in  Treatment: 34 Compression Therapy Performed for Wound Assessment: Wound #3 Right,Proximal,Lateral Lower Leg Performed By: Clinician [name] Compression Type: Four Layer Post Procedure Diagnosis Same as Pre-procedure Electronic Signature(s) Signed: 06/05/2020 6:22:31 PM By: [name] Entered By: [name] on 06/05/2020 08:38:19 -------------------------------------------------------------------------------- Compression Therapy Details Patient Name: Date of Service: [name] 06/05/2020 8:00 [place] Number: [id number] Patient Account Number: [id number] Date of Birth/Sex: Treating Patton: Nov 09, 1940 (79 y.o. [name] Primary Care Sheelah Ritacco: Troy Pear., [name] Other Clinician: Referring Demyah Smyre: Treating [name]/Extender: Troy Leitz., [name] in Treatment: 34 Compression Therapy Performed for Wound Assessment: Wound #4 Right,Distal,Lateral Lower Leg Performed By: Clinician [name] Compression Type: Four Layer Post Procedure Diagnosis Same as Pre-procedure Electronic Signature(s) Signed: 06/05/2020 6:22:31 PM By: [name] Entered By: [name] on 06/05/2020 08:38:19 -------------------------------------------------------------------------------- Encounter Discharge Information Details Patient Name: Date of Service: [name] 06/05/2020 8:00 [place] Record Number: [id number] Patient Account Number: [id number] Date of Birth/Sex: Treating Patton: [birth year] (79 y.o. [name] Primary Care Shrihaan Porzio: Troy Pear., [name] Other Clinician: Referring Carson Meche: Treating [name]/Extender: Troy Leitz., [name] in Treatment: 53 Encounter Discharge Information Items Discharge Condition: Stable Ambulatory Status: Cane Discharge Destination: Home Transportation: Other Accompanied By: self Schedule Follow-up Appointment: Yes Clinical Summary of Care:  Patient Declined Electronic Signature(s) Signed: 06/05/2020 5:54:48 PM By: [name] Entered By: [name] on 06/05/2020 08:58:02 -------------------------------------------------------------------------------- Lower Extremity Assessment Details Patient Name: Date of Service: [name] 06/05/2020 8:00 [place] Record Number: [id number] Patient Account Number: [id number] Date of Birth/Sex: Treating Patton: [birth year] (79 y.o. [name] Primary Care Alyx Mcguirk: Troy Pear., [name] Other Clinician: Referring Junia Nygren: Treating [name]/Extender: Troy Leitz., [name] in Treatment: 34 Edema Assessment Assessed: [Left: No] [  Right: No] Edema: [Left: Ye] [Right: s] Calf Left: Right: Point of Measurement: 43 cm From Medial Instep 35 cm Ankle Left: Right: Point of Measurement: 14 cm From Medial Instep 25 cm Vascular Assessment Pulses: Dorsalis Pedis Palpable: [Right:No] Electronic Signature(s) Signed: 06/05/2020 5:54:48 PM By: [name] Entered By: [name] on 06/05/2020 08:06:42 -------------------------------------------------------------------------------- Multi Wound Chart Details Patient Name: Date of Service: [name] 06/05/2020 8:00 [place] Record Number: [id number] Patient Account Number: [id number] Date of Birth/Sex: Treating Patton: 05-17-1941 (79 y.o. [name], [name] Primary Care [name]: Troy Pear., [name] Other Clinician: Referring Sherene Plancarte: Treating Miangel Flom/Extender: Troy Leitz., [name] in Treatment: 34 Vital Signs Height(in): 71 Pulse(bpm): 86 Weight(lbs): 198 Blood Pressure(mmHg): 122/74 Body Mass Index(BMI): 28 Temperature(F): 97.6 Respiratory Rate(breaths/min): 19 Photos: [3:No Photos Right, Proximal, Lateral Lower Leg] [4:No Photos Right, Distal, Lateral Lower Leg] [N/A:N/A N/A] Wound Location: [3:Blister] [4:Blister] [N/A:N/A] Wounding  Event: [3:Venous Leg Ulcer] [4:Venous Leg Ulcer] [N/A:N/A] Primary Etiology: [3:Anemia, Congestive Heart Failure,] [4:Anemia, Congestive Heart Failure,] [N/A:N/A] Comorbid History: [3:Hypertension, Peripheral Venous Disease, End Stage Renal Disease Disease, 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] [4:Hypertension, Peripheral Venous 09/11/2019] [N/A:N/A] Date Acquired: [3:34] [4:34] [N/A:N/A] Weeks of Treatment: [3:Open] [4:Open] [N/A:N/A] Wound Status: [3:1.1x0.8x0.1] [4:2x1.9x0.3] [N/A:N/A] Measurements L x W x D (cm) [3:0.691] [4:2.985] [N/A:N/A] A (cm) : rea [3:0.069] [4:0.895] [N/A:N/A] Volume (cm) : [3:86.00%] [4:67.90%] [N/A:N/A] % Reduction in Area: [3:86.10%] [4:3.80%] [N/A:N/A] % Reduction in Volume: [3:Full Thickness Without Exposed] [4:Full Thickness Without Exposed] [N/A:N/A] Classification: [3:Support Structures Medium] [4:Support Structures Medium] [N/A:N/A] Exudate Amount: [3:Serosanguineous] [4:Serosanguineous] [N/A:N/A] Exudate Type: [3:red, brown] [4:red, brown] [N/A:N/A] Exudate Color: [3:Distinct, outline attached] [4:Well defined, not attached] [N/A:N/A] Wound Margin: [3:Large (67-100%)] [4:Large (67-100%)] [N/A:N/A] Granulation Amount: [3:Red, Pink] [4:Red, Pink] [N/A:N/A] Granulation Quality: [3:Small (1-33%)] [4:Small (1-33%)] [N/A:N/A] Necrotic Amount: [3:Fat Layer (Subcutaneous Tissue): Yes Fat Layer (Subcutaneous Tissue): Yes N/A] Exposed Structures: [3:Fascia: No Tendon: No Muscle: No Joint: No Bone: No Small (1-33%)] [4:Fascia: No Tendon: No Muscle: No Joint: No Bone: No Small (1-33%)] [N/A:N/A] Epithelialization: [3:Compression Therapy] [4:Compression Therapy] [N/A:N/A] Treatment Notes Wound #3 (Right, Proximal, Lateral Lower Leg) 1. Cleanse With Wound Cleanser Soap and water 2. Periwound Care Moisturizing lotion 3. Primary Dressing Applied Collegen AG 4. Secondary Dressing ABD Pad Dry Gauze 6. Support Layer Applied 4 layer compression  wrap Notes netting Wound #4 (Right, Distal, Lateral Lower Leg) 1. Cleanse With Wound Cleanser Soap and water 2. Periwound Care Moisturizing lotion 3. Primary Dressing Applied Collegen AG 4. Secondary Dressing ABD Pad Dry Gauze 6. Support Layer Applied 4 layer compression wrap Notes netting Electronic Signature(s) Signed: 06/05/2020 6:22:31 PM By: [name] Signed: 06/06/2020 8:12:45 AM By: Linton Ham MD Entered By: Linton Ham on 06/05/2020 09:25:44 -------------------------------------------------------------------------------- Multi-Disciplinary Care Plan Details Patient Name: Date of Service: [place] 06/05/2020 8:00 [place] Record Number: [id number] Patient Account Number: [id number] Date of Birth/Sex: Treating Patton: April 15, 1941 (79 y.o. [name], [name] Primary Care Anias Bartol: Troy Pear., [name] Other Clinician: Referring Ammara Raj: Treating Ozil Stettler/Extender: Troy Leitz., [name] in Treatment: 34 Active Inactive Venous Leg Ulcer Nursing Diagnoses: Knowledge deficit related to disease process and management Potential for venous Insuffiency (use before diagnosis confirmed) Goals: Patient will maintain optimal edema control Date Initiated: 05/01/2020 Target Resolution Date: 06/27/2020 Goal Status: Active Interventions: Assess peripheral edema status every visit. Compression as ordered Provide education on venous insufficiency Notes: Wound/Skin Impairment Nursing Diagnoses: Knowledge deficit related  to ulceration/compromised skin integrity Goals: Patient/caregiver will verbalize understanding of skin care regimen Date Initiated: 10/11/2019 Target Resolution Date: 06/27/2020 Goal Status: Active Interventions: Assess patient/caregiver ability to obtain necessary supplies Assess patient/caregiver ability to perform ulcer/skin care regimen upon admission and as needed Assess ulceration(s) every visit Provide education  on ulcer and skin care Treatment Activities: Skin care regimen initiated : 10/11/2019 Topical wound management initiated : 10/11/2019 Notes: Electronic Signature(s) Signed: 06/05/2020 6:22:31 PM By: [name] Entered By: [name] on 06/05/2020 07:58:13 -------------------------------------------------------------------------------- Pain Assessment Details Patient Name: Date of Service: [place] [name] [place] 06/05/2020 8:00 [place] Number: [id number] Patient Account Number: [id number] Date of Birth/Sex: Treating Patton: Aug 13, 1941 (79 y.o. [name] Primary Care Semaja Lymon: Troy Pear., [name] Other Clinician: Referring Mischele Detter: Treating Treyvon Blahut/Extender: Troy Leitz., [name] in Treatment: 31 Active Problems Location of Pain Severity and Description of Pain Patient Has Paino No Site Locations Pain Management and Medication Current Pain Management: Electronic Signature(s) Signed: 06/05/2020 5:54:48 PM By: [name] Entered By: [name] on 06/05/2020 08:06:14 -------------------------------------------------------------------------------- Patient/Caregiver Education Details Patient Name: Date of Service: [place], [place] 10/7/2021andnbsp8:00 A M Medical Record Number: [id number] Patient Account Number: [id number] Date of Birth/Gender: Treating Patton: 06/05/1941 (79 y.o. [name] Primary Care Physician: Troy Pear., [name] Other Clinician: Referring Physician: Treating Physician/Extender: Troy Leitz., [name] in Treatment: 40 Education Assessment Education Provided To: Patient Education Topics Provided Wound/Skin Impairment: Handouts: Skin Care Do's and Dont's Methods: Explain/Verbal Responses: Reinforcements needed Electronic Signature(s) Signed: 06/05/2020 6:22:31 PM By: [name] Entered By: [name] on 06/05/2020  07:58:24 -------------------------------------------------------------------------------- Wound Assessment Details Patient Name: Date of Service: [place] [name] [place] 06/05/2020 8:00 [place] Record Number: [id number] Patient Account Number: [id number] Date of Birth/Sex: Treating Patton: 23-Apr-1941 (79 y.o. [name] Primary Care Myles Mallicoat: Troy Pear., [name] Other Clinician: Referring Shirell Struthers: Treating Binta Statzer/Extender: Troy Leitz., [name] in Treatment: 34 Wound Status Wound Number: 3 Primary Venous Leg Ulcer Etiology: Wound Location: Right, Proximal, Lateral Lower Leg Wound Open Wounding Event: Blister Status: Date Acquired: 09/11/2019 Comorbid Anemia, Congestive Heart Failure, Hypertension, Peripheral Weeks Of Treatment: 34 History: Venous Disease, End Stage Renal Disease Clustered Wound: No Wound Measurements Length: (cm) 1.1 Width: (cm) 0.8 Depth: (cm) 0.1 Area: (cm) 0.691 Volume: (cm) 0.069 % Reduction in Area: 86% % Reduction in Volume: 86.1% Epithelialization: Small (1-33%) Tunneling: No Undermining: No Wound Description Classification: Full Thickness Without Exposed Support Structures Wound Margin: Distinct, outline attached Exudate Amount: Medium Exudate Type: Serosanguineous Exudate Color: red, brown Foul Odor After Cleansing: No Slough/Fibrino Yes Wound Bed Granulation Amount: Large (67-100%) Exposed Structure Granulation Quality: Red, Pink Fascia Exposed: No Necrotic Amount: Small (1-33%) Fat Layer (Subcutaneous Tissue) Exposed: Yes Necrotic Quality: Adherent Slough Tendon Exposed: No Muscle Exposed: No Joint Exposed: No Bone Exposed: No Treatment Notes Wound #3 (Right, Proximal, Lateral Lower Leg) 1. Cleanse With Wound Cleanser Soap and water 2. Periwound Care Moisturizing lotion 3. Primary Dressing Applied Collegen AG 4. Secondary Dressing ABD Pad Dry Gauze 6. Support Layer Applied 4 layer  compression wrap Notes netting Electronic Signature(s) Signed: 06/05/2020 5:54:48 PM By: [name] Entered By: [name] on 06/05/2020 08:07:07 -------------------------------------------------------------------------------- Wound Assessment Details Patient Name: Date of Service: [place], [place] 06/05/2020 8:00 [place] Record Number: [id number] Patient Account Number: [id number] Date of Birth/Sex: Treating Patton:  [birth year] (79 y.o. [name] Primary Care Christropher Gintz: Troy Pear., [name] Other Clinician: Referring Harlin Mazzoni: Treating Ninel Abdella/Extender: Troy Leitz., [name] in Treatment: 34 Wound Status Wound Number: 4 Primary Venous Leg Ulcer Etiology: Wound Location: Right, Distal, Lateral Lower Leg Wound Open Wounding Event: Blister Status: Date Acquired: 09/11/2019 Comorbid Anemia, Congestive Heart Failure, Hypertension, Peripheral Weeks Of Treatment: 34 History: Venous Disease, End Stage Renal Disease Clustered Wound: No Wound Measurements Length: (cm) 2 Width: (cm) 1.9 Depth: (cm) 0.3 Area: (cm) 2.985 Volume: (cm) 0.895 % Reduction in Area: 67.9% % Reduction in Volume: 3.8% Epithelialization: Small (1-33%) Tunneling: No Undermining: No Wound Description Classification: Full Thickness Without Exposed Support Structures Wound Margin: Well defined, not attached Exudate Amount: Medium Exudate Type: Serosanguineous Exudate Color: red, brown Foul Odor After Cleansing: No Slough/Fibrino Yes Wound Bed Granulation Amount: Large (67-100%) Exposed Structure Granulation Quality: Red, Pink Fascia Exposed: No Necrotic Amount: Small (1-33%) Fat Layer (Subcutaneous Tissue) Exposed: Yes Necrotic Quality: Adherent Slough Tendon Exposed: No Muscle Exposed: No Joint Exposed: No Bone Exposed: No Treatment Notes Wound #4 (Right, Distal, Lateral Lower Leg) 1. Cleanse With Wound Cleanser Soap and water 2. Periwound  Care Moisturizing lotion 3. Primary Dressing Applied Collegen AG 4. Secondary Dressing ABD Pad Dry Gauze 6. Support Layer Applied 4 layer compression wrap Notes netting Electronic Signature(s) Signed: 06/05/2020 5:54:48 PM By: [name] Entered By: [name] on 06/05/2020 08:07:38 -------------------------------------------------------------------------------- Vitals Details Patient Name: Date of Service: [place], [place] 06/05/2020 8:00 [place] Record Number: [id number] Patient Account Number: [id number] Date of Birth/Sex: Treating Patton: 10/31/1940 (79 y.o. [name] Primary Care Debar Plate: Troy Pear., [name] Other Clinician: Referring Darrly Loberg: Treating Kiet Geer/Extender: Troy Leitz., [name] in Treatment: 34 Vital Signs Time Taken: 07:50 Temperature (F): 97.6 Height (in): 71 Pulse (bpm): 86 Weight (lbs): 198 Respiratory Rate (breaths/min): 19 Body Mass Index (BMI): 27.6 Blood Pressure (mmHg): 122/74 Reference Range: 80 - 120 mg / dl Electronic Signature(s) Signed: 06/05/2020 5:54:48 PM By: [name] Entered By: [name] on 06/05/2020 08:06:09

## 2020-06-19 ENCOUNTER — Other Ambulatory Visit: Payer: Self-pay

## 2020-06-19 ENCOUNTER — Encounter (HOSPITAL_BASED_OUTPATIENT_CLINIC_OR_DEPARTMENT_OTHER): Payer: Medicare Other | Admitting: Internal Medicine

## 2020-06-19 DIAGNOSIS — E11621 Type 2 diabetes mellitus with foot ulcer: Secondary | ICD-10-CM | POA: Diagnosis not present

## 2020-06-19 NOTE — Progress Notes (Signed)
ELLA, GUILLOTTE ([id number]) Visit Report for 06/19/2020 Arrival Information Details Patient Name: Date of Service: [place] 06/19/2020 8:00 [place] Record Number: [id number] Patient Account Number: [id number] Date of Birth/Sex: Treating RN: [birth year] (79 y.o. [name], [name] Primary Care Abdoulie Tierce: [name]., [name] Other Clinician: Referring Jadon Harbaugh: Treating Nihira Puello/Extender: [name], [name] in Treatment: 36 Visit Information History Since Last Visit Added or deleted any medications: No Patient Arrived: [name] Any new allergies or adverse reactions: No Arrival Time: 07:50 Had a fall or experienced change in No Accompanied By: self activities of daily living that may affect Transfer Assistance: None risk of falls: Patient Identification Verified: Yes Signs or symptoms of abuse/neglect since last visito No Secondary Verification Process Completed: Yes Hospitalized since last visit: No Patient Requires Transmission-Based Precautions: No Implantable device outside of the clinic excluding No Patient Has Alerts: Yes cellular tissue based products placed in the center Patient Alerts: Patient on Blood Thinner since last visit: Has Dressing in Place as Prescribed: Yes Has Compression in Place as Prescribed: Yes Pain Present Now: No Notes patient arrived with bilateral lower legs wrap with compression r/t area opened to left leg. removed wrap no openings. will make note for MD to evaluate. Electronic Signature(s) Signed: 06/19/2020 5:47:32 PM By: Deon Pilling Entered By: Deon Pilling on 06/19/2020 08:06:04 -------------------------------------------------------------------------------- Compression Therapy Details Patient Name: Date of Service: [name] [place] 06/19/2020 8:00 [place] Record Number: [id number] Patient Account Number: [id number] Date of Birth/Sex: Treating RN: 1941/06/19 (79 y.o. [name] Primary  Care Maven Rosander: [name]., [name] Other Clinician: Referring Hopie Pellegrin: Treating Shemeika Starzyk/Extender: [name], [name] in Treatment: 36 Compression Therapy Performed for Wound Assessment: Wound #4 Right,Distal,Lateral Lower Leg Performed By: Clinician Baruch Gouty, RN Compression Type: Four Layer Pre Treatment ABI: 1.1 Post Procedure Diagnosis Same as Pre-procedure Electronic Signature(s) Signed: 06/19/2020 5:47:32 PM By: Deon Pilling Entered By: Deon Pilling on 06/19/2020 08:45:25 -------------------------------------------------------------------------------- Encounter Discharge Information Details Patient Name: Date of Service: [place] 06/19/2020 8:00 [place] Record Number: [id number] Patient Account Number: [id number] Date of Birth/Sex: Treating RN: 08-03-1941 (79 y.o. [name] Primary Care Aaylah Pokorny: [name]., [name] Other Clinician: Referring Rula [place]: Treating Jayron Maqueda/Extender: [name], [name] in Treatment: 36 Encounter Discharge Information Items Post Procedure Vitals Discharge Condition: Stable Temperature (F): 97.6 Ambulatory Status: Ambulatory Pulse (bpm): 97 Discharge Destination: Home Respiratory Rate (breaths/min): 18 Transportation: Private Auto Blood Pressure (mmHg): 145/77 Accompanied By: self Schedule Follow-up Appointment: Yes Clinical Summary of Care: Patient Declined Electronic Signature(s) Signed: 06/19/2020 5:49:14 PM By: Baruch Gouty RN, BSN Entered By: Baruch Gouty on 06/19/2020 09:04:44 -------------------------------------------------------------------------------- Lower Extremity Assessment Details Patient Name: Date of Service: [place] 06/19/2020 8:00 [place] Record Number: [id number] Patient Account Number: [id number] Date of Birth/Sex: Treating RN: [birth year] (79 y.o. [name], [name] Primary Care Chrissie Dacquisto: [name]., RO  [name] Other Clinician: Referring Kaydyn Sayas: Treating Felise Georgia/Extender: [name], [name] in Treatment: 36 Edema Assessment Assessed: [Left: Yes] [Right: Yes] Edema: [Left: Yes] [Right: Yes] Calf Left: Right: Point of Measurement: 53 cm From Medial Instep 38 cm 37 cm Ankle Left: Right: Point of Measurement: 14 cm From Medial Instep 22 cm 22 cm Electronic Signature(s) Signed: 06/19/2020 5:47:32 PM By: Deon Pilling Entered By: Deon Pilling on 06/19/2020 08:49:15 -------------------------------------------------------------------------------- Multi  Wound Chart Details Patient Name: Date of Service: [place], [place] 06/19/2020 8:00 [place] Record Number: [id number] Patient Account Number: [id number] Date of Birth/Sex: Treating RN: [birth year] (79 y.o. [name], [name] Primary Care Hallis Meditz: [name]., [name] Other Clinician: Referring Yuniel Blaney: Treating Ashir Kunz/Extender: [name], [name] in Treatment: 36 Vital Signs Height(in): 71 Pulse(bpm): 97 Weight(lbs): 198 Blood Pressure(mmHg): 145/77 Body Mass Index(BMI): 28 Temperature(F): 97.6 Respiratory Rate(breaths/min): 18 Photos: [3:No Photos Right, Proximal, Lateral Lower Leg] [4:No Photos Right, Distal, Lateral Lower Leg] [N/A:N/A N/A] Wound Location: [3:Blister] [4:Blister] [N/A:N/A] Wounding Event: [3:Venous Leg Ulcer] [4:Venous Leg Ulcer] [N/A:N/A] Primary Etiology: [3:N/A] [4:Anemia, Congestive Heart Failure,] [N/A:N/A] Comorbid History: [3:09/11/2019] [4:Hypertension, Peripheral Venous Disease, 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] [N/A:N/A] Date Acquired: [3:36] [4:36] [N/A:N/A] Weeks of Treatment: [3:Healed - Epithelialized] [4:Open] [N/A:N/A] Wound Status: [3:0x0x0] [4:1.9x1.2x0.3] [N/A:N/A] Measurements L x W x D (cm) [3:0] [4:1.791] [N/A:N/A] A (cm) : rea [3:0] [4:0.537] [N/A:N/A] Volume (cm) : [3:100.00%] [4:80.70%] [N/A:N/A] % Reduction in Area:  [3:100.00%] [4:42.30%] [N/A:N/A] % Reduction in Volume: [3:Full Thickness Without Exposed] [4:Full Thickness Without Exposed] [N/A:N/A] Classification: [3:Support Structures N/A] [4:Support Structures Medium] [N/A:N/A] Exudate A mount: [3:N/A] [4:Serosanguineous] [N/A:N/A] Exudate Type: [3:N/A] [4:red, brown] [N/A:N/A] Exudate Color: [3:N/A] [4:Well defined, not attached] [N/A:N/A] Wound Margin: [3:N/A] [4:Large (67-100%)] [N/A:N/A] Granulation A mount: [3:N/A] [4:Red, Pink] [N/A:N/A] Granulation Quality: [3:N/A] [4:Small (1-33%)] [N/A:N/A] Necrotic A mount: [3:N/A] [4:Small (1-33%)] [N/A:N/A] Epithelialization: [3:N/A] [4:Debridement - Excisional] [N/A:N/A] Debridement: Pre-procedure Verification/Time Out N/A [4:08:40] [N/A:N/A] Taken: [3:N/A] [4:Lidocaine 4% Topical Solution] [N/A:N/A] Pain Control: [3:N/A] [4:Subcutaneous, Slough] [N/A:N/A] Tissue Debrided: [3:N/A] [4:Skin/Subcutaneous Tissue] [N/A:N/A] Level: [3:N/A] [4:2.28] [N/A:N/A] Debridement A (sq cm): [3:rea N/A] [4:Curette] [N/A:N/A] Instrument: [3:N/A] [4:Moderate] [N/A:N/A] Bleeding: [3:N/A] [4:Pressure] [N/A:N/A] Hemostasis A chieved: [3:N/A] [4:0] [N/A:N/A] Procedural Pain: [3:N/A] [4:0] [N/A:N/A] Post Procedural Pain: [3:N/A] [4:Procedure was tolerated well] [N/A:N/A] Debridement Treatment Response: [3:N/A] [4:1.9x1.2x0.3] [N/A:N/A] Post Debridement Measurements L x W x D (cm) [3:N/A] [4:0.537] [N/A:N/A] Post Debridement Volume: (cm) [3:N/A] [4:Compression Therapy] [N/A:N/A] Procedures Performed: [4:Debridement] Treatment Notes Wound #4 (Right, Distal, Lateral Lower Leg) 2. Periwound Care Moisturizing lotion 3. Primary Dressing Applied Collegen AG 4. Secondary Dressing Dry Gauze 6. Support Layer Applied 4 layer compression wrap Notes netting Electronic Signature(s) Signed: 06/19/2020 5:31:58 PM By: Linton Ham MD Signed: 06/19/2020 5:47:32 PM By: Deon Pilling Entered By: Linton Ham on 06/19/2020  09:08:34 -------------------------------------------------------------------------------- Multi-Disciplinary Care Plan Details Patient Name: Date of Service: [place] 06/19/2020 8:00 [place] Record Number: [id number] Patient Account Number: [id number] Date of Birth/Sex: Treating RN: [birth year] (79 y.o. [name], [name] Primary Care Monserratt Knezevic: [name]., [name] Other Clinician: Referring Mekiah Wahler: Treating Kechia Yahnke/Extender: [name], [name] in Treatment: 36 Active Inactive Venous Leg Ulcer Nursing Diagnoses: Knowledge deficit related to disease process and management Potential for venous Insuffiency (use before diagnosis confirmed) Goals: Patient will maintain optimal edema control Date Initiated: 05/01/2020 Target Resolution Date: 06/27/2020 Goal Status: Active Interventions: Assess peripheral edema status every visit. Compression as ordered Provide education on venous insufficiency Notes: Wound/Skin Impairment Nursing Diagnoses: Knowledge deficit related to ulceration/compromised skin integrity Goals: Patient/caregiver will verbalize understanding of skin care regimen Date Initiated: 10/11/2019 Target Resolution Date: 06/27/2020 Goal Status: Active Interventions: Assess patient/caregiver ability to obtain necessary supplies Assess patient/caregiver ability to perform ulcer/skin care regimen upon admission and as needed Assess ulceration(s) every visit Provide education on ulcer and skin care Treatment Activities: Skin care regimen initiated :  10/11/2019 Topical wound management initiated : 10/11/2019 Notes: Electronic Signature(s) Signed: 06/19/2020 5:47:32 PM By: Deon Pilling Entered By: Deon Pilling on 06/19/2020 08:26:36 -------------------------------------------------------------------------------- Pain Assessment Details Patient Name: Date of Service: [name] 06/19/2020 8:00 [place] Record  Number: [id number] Patient Account Number: [id number] Date of Birth/Sex: Treating RN: [birth year] (79 y.o. [name], [name] Primary Care Mickel Schreur: [name]., [name] Other Clinician: Referring Caterra Ostroff: Treating Sathvika Ojo/Extender: [name], [name] in Treatment: 36 Active Problems Location of Pain Severity and Description of Pain Patient Has Paino No Site Locations Rate the pain. Current Pain Level: 0 Pain Management and Medication Current Pain Management: Medication: No Cold Application: No Rest: No Massage: No Activity: No T.E.N.S.: No Heat Application: No Leg drop or elevation: No Is the Current Pain Management Adequate: Adequate How does your wound impact your activities of daily livingo Sleep: No Bathing: No Appetite: No Relationship With Others: No Bladder Continence: No Emotions: No Bowel Continence: No Work: No Toileting: No Drive: No Dressing: No Hobbies: No Electronic Signature(s) Signed: 06/19/2020 5:47:32 PM By: Deon Pilling Entered By: Deon Pilling on 06/19/2020 08:04:12 -------------------------------------------------------------------------------- Patient/Caregiver Education Details Patient Name: Date of Service: [place] 10/21/2021andnbsp8:00 [place] Record Number: [id number] Patient Account Number: [id number] Date of Birth/Gender: Treating RN: [birth year] (79 y.o. [name] Physician: [name]., [name] Other Clinician: Referring Physician: Treating Physician/Extender: [name], [name] in Treatment: 87 Education Assessment Education Provided To: Patient Education Topics Provided Venous: Handouts: Managing Venous Disease and Related Ulcers Methods: Explain/Verbal Responses: Reinforcements needed Electronic Signature(s) Signed: 06/19/2020 5:47:32 PM By: Deon Pilling Entered By: Deon Pilling on 06/19/2020  08:26:49 -------------------------------------------------------------------------------- Wound Assessment Details Patient Name: Date of Service: [place] [name] [place] 06/19/2020 8:00 A M Medical Record Number: [id number] Patient Account Number: [id number] Date of Birth/Sex: Treating RN: [birth year] (79 y.o. [name], [name] Primary Care Dessirae Scarola: [name]., [name] Other Clinician: Referring Paisely Brick: Treating Anum Palecek/Extender: [name], [name] in Treatment: 36 Wound Status Wound Number: 3 Primary Etiology: Venous Leg Ulcer Wound Location: Right, Proximal, Lateral Lower Leg Wound Status: Healed - Epithelialized Wounding Event: Blister Date Acquired: 09/11/2019 Weeks Of Treatment: 36 Clustered Wound: No Wound Measurements Length: (cm) Width: (cm) Depth: (cm) Area: (cm) Volume: (cm) 0 % Reduction in Area: 100% 0 % Reduction in Volume: 100% 0 0 0 Wound Description Classification: Full Thickness Without Exposed Support Structur es Electronic Signature(s) Signed: 06/19/2020 5:47:32 PM By: Deon Pilling Entered By: Deon Pilling on 06/19/2020 08:04:37 -------------------------------------------------------------------------------- Wound Assessment Details Patient Name: Date of Service: [place] [name] [place] 06/19/2020 8:00 A M Medical Record Number: [id number] Patient Account Number: [id number] Date of Birth/Sex: Treating RN: 22-Dec-1940 (79 y.o. [name], [name] Primary Care Vanilla [place]: [name]., [name] Other Clinician: Referring Elad Macphail: Treating Jatziri Goffredo/Extender: [name], [name] in Treatment: 36 Wound Status Wound Number: 4 Primary Venous Leg Ulcer Etiology: Wound Location: Right, Distal, Lateral Lower Leg Wound Open Wounding Event: Blister Status: Date Acquired: 09/11/2019 Comorbid Anemia, Congestive Heart Failure, Hypertension, Peripheral Weeks Of Treatment: 36 History: Venous Disease, End Stage  Renal Disease Clustered Wound: No Wound Measurements Length: (cm) 1.9 Width: (cm) 1.2 Depth: (cm) 0.3 Area: (cm) 1.791 Volume: (cm) 0.537 % Reduction in Area: 80.7% % Reduction in Volume: 42.3% Epithelialization: Small (1-33%) Tunneling: No Undermining: No Wound Description Classification:  Full Thickness Without Exposed Support Structures Wound Margin: Well defined, not attached Exudate Amount: Medium Exudate Type: Serosanguineous Exudate Color: red, brown Foul Odor After Cleansing: No Slough/Fibrino Yes Wound Bed Granulation Amount: Large (67-100%) Exposed Structure Granulation Quality: Red, Pink Fascia Exposed: No Necrotic Amount: Small (1-33%) Fat Layer (Subcutaneous Tissue) Exposed: Yes Necrotic Quality: Adherent Slough Tendon Exposed: No Muscle Exposed: No Joint Exposed: No Bone Exposed: No Treatment Notes Wound #4 (Right, Distal, Lateral Lower Leg) 2. Periwound Care Moisturizing lotion 3. Primary Dressing Applied Collegen AG 4. Secondary Dressing Dry Gauze 6. Support Layer Applied 4 layer compression wrap Notes netting Electronic Signature(s) Signed: 06/19/2020 5:47:32 PM By: Deon Pilling Entered By: Deon Pilling on 06/19/2020 08:05:23 -------------------------------------------------------------------------------- Vitals Details Patient Name: Date of Service: [place] 06/19/2020 8:00 [place] Record Number: [id number] Patient Account Number: [id number] Date of Birth/Sex: Treating RN: 11-30-1940 (79 y.o. [name], [name] Primary Care Alicen Donalson: [name]., [name] Other Clinician: Referring Cutberto Winfree: Treating Marvine Encalade/Extender: [name], [name] in Treatment: 36 Vital Signs Time Taken: 07:50 Temperature (F): 97.6 Height (in): 71 Pulse (bpm): 97 Weight (lbs): 198 Respiratory Rate (breaths/min): 18 Body Mass Index (BMI): 27.6 Blood Pressure (mmHg): 145/77 Reference Range: 80 - 120 mg / dl Electronic  Signature(s) Signed: 06/19/2020 5:47:32 PM By: Deon Pilling Entered By: Deon Pilling on 06/19/2020 08:04:00

## 2020-06-19 NOTE — Progress Notes (Signed)
[name], [name] ([id number]) Visit Report for 06/19/2020 Debridement Details Patient Name: Date of Service: [place] [name] 06/19/2020 8:00 [place] Record Number: [id number] Patient Account Number: [id number] Date of Birth/Sex: Treating RN: 02/23/1941 (79 y.o. [name] Primary Care Provider: [name]., [name] Other Clinician: Referring Provider: Treating Provider/Extender: [name], [name] in Treatment: 36 Debridement Performed for Assessment: Wound #4 Right,Distal,Lateral Lower Leg Performed By: Physician Ricard Dillon., MD Debridement Type: Debridement Severity of Tissue Pre Debridement: Fat layer exposed Level of Consciousness (Pre-procedure): Awake and Alert Pre-procedure Verification/Time Out Yes - 08:40 Taken: Start Time: 08:41 Pain Control: Lidocaine 4% T opical Solution T Area Debrided (L x W): otal 1.9 (cm) x 1.2 (cm) = 2.28 (cm) Tissue and other material debrided: Viable, Non-Viable, Slough, Subcutaneous, Skin: Dermis , Fibrin/Exudate, Slough Level: Skin/Subcutaneous Tissue Debridement Description: Excisional Instrument: Curette Bleeding: Moderate Hemostasis Achieved: Pressure End Time: 08:44 Procedural Pain: 0 Post Procedural Pain: 0 Response to Treatment: Procedure was tolerated well Level of Consciousness (Post- Awake and Alert procedure): Post Debridement Measurements of Total Wound Length: (cm) 1.9 Width: (cm) 1.2 Depth: (cm) 0.3 Volume: (cm) 0.537 Character of Wound/Ulcer Post Debridement: Improved Severity of Tissue Post Debridement: Fat layer exposed Post Procedure Diagnosis Same as Pre-procedure Electronic Signature(s) Signed: 06/19/2020 5:31:58 PM By: Linton Ham MD Signed: 06/19/2020 5:47:32 PM By: Deon Pilling Entered By: Linton Ham on 06/19/2020 09:08:42 -------------------------------------------------------------------------------- HPI Details Patient Name: Date of Service: [name] 06/19/2020 8:00 A M Medical Record Number: [id number] Patient Account Number: [id number] Date of Birth/Sex: Treating RN: Sep 16, 1940 (79 y.o. [name], [name] Primary Care Provider: [name]., [name] Other Clinician: Referring Provider: Treating Provider/Extender: [name], [name] in Treatment: 36 History of Present Illness HPI Description: ADMISSION 10/11/2019 This is a 79 year old man with a history of a severe cardiomyopathy with an ejection fraction of about 20%, chronic renal failure stage III. He is listed as a type II diabetic in epic although the patient denies this. He also has a history of PVD. He states for the last month he has had wounds on his bilateral lower extremities that started off as blisters which denuded. He has areas on the left lateral calf and 2 on the right lateral. He has an area on the left first met head which he did not know was there he we identified this on intake. He has been using Silvadene cream provided by his primary care physician but he is complaining that this burns. 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 He was acutely ill at that time complicating GI bleed. ABIs in our clinic were 1.16 on the right and 1.13 on the left 2/25; the patient comes in with his areas on the left lateral and right lateral calf. There is also an area over the left first MTP bunion deformity. We have been using Sorbact. His edema control is fairly good 3/4; left lateral and right lateral calf. Most of his wounds are in the same position tightly adherent nonviable debris. On the  right we debrided the superior wound on the left  both wounds. The area on his bunion over the left first MTP medially is I think just about closed. We have been using Sorbact without a lot of success changed to Iodoflex under compression 3/11; left lateral and right lateral calf perhaps minor improvement in the surface condition. We have been using Iodoflex. The area over the bunion of the left first MTP has closed over 3/18; left lateral and right lateral calf not much improvement. We have been using Iodoflex. Aggressive debridement last week. 3/25; left lateral and right lateral calf. We have been using Iodoflex. There is some improvement in the superior area on the right and 2 on the lateral left although there is still a lot of debris on the surface. The inferior area on the right is still a completely nonviable surface. 4/8; left lateral and right lateral calfs. Essentially mirror-image looking wounds 2 wounds on each side in close juxtaposition we have been using Iodoflex with some improvement in the very adherent fibrinous debris but not a lot. The patient has arterial studies next Wednesday morning and venous studies next Thursday morning. I have been avoiding any further aggressive debridement until we see the arterial study results. His ABIs were fairly good in this clinic and is dorsalis pedis pulses are palpable but the wound beds are pale. 4/15; left lateral and right lateral calfs. Essentially mirror-image wounds with 2 wounds on each side in close juxtaposition but with rims of separating normal tissue. We have been using Iodoflex. The base of the wounds has been cleaning up quite nicely ARTERIAL STUDIES were done showing the patient had an ABI on the right of 1.27 with triphasic waveforms and a TBI of 0.90 on the left triphasic waveforms with an ABI of 1.28 and a TBI of 0.87. No evidence of arterial disease VENOUS REFLUX STUDIES; were done yesterday we do not have this report  yet. 4/23; VENOUS REFLUX STUDIES did not show any significant reflux right lower extremity. No evidence of a DVT He did have significant reflux in the common . femoral vein on the left but nothing else was listed as significant. He did not have a DVT. We have been using Iodoflex under compression his wounds are making progress. 4/29; improvements in the wound surface continue. We have been using Iodoflex. He has a 20% out-of-pocket co-pay for Apligraf which is unfortunate. We changed him to Sorbact today 5/6; started on Sorbact last week. Better looking wound surface but not much change in dimensions. 01/24/20-Patient is back at 3 weeks, wound surfaces are about the same but very minimal slough on the right leg wounds, however there is blistering on both legs adjacent to the wounds, patient denies any other symptoms or new symptoms. His studies have been reviewed and do not indicate any significant arterial or venous disease. But he is attending once in 3 weeks with home health changing in between 6/3; patient has 4 wounds 2 on each side of his lateral lower legs. These wounds are somewhat improved. He apparently arrived in clinic last week with a large blister on the right lateral lower leg that had denuded into the wound. They changed to silver alginate. Still under compression. He has straight Medicare and unfortunately has an unlimited co-pay for advanced treatment options. 6/10; his original 4 wounds are all better especially on the left lateral lower leg. Areas on the right medial have a better looking surface were using silver collagen The blister on the right lateral lower leg from last week has an  open area however this looks fairly healthy were using silver alginate on this He comes in today having "stubbed" his left second toe he has an abrasion at the base of the nailbed I think he probably caught the nail which is mycotic. 6/24; blister on the right lateral lower leg has healed. There  is no additional wounds. The traumatic left second toe is also closed. The 4 that he has are all better 7/1; all of the patient's wounds are smaller except for the proximal one on the left lateral calf. We are using silver collagen 7/22; patient has home health. Relatively refractory wounds on the lateral part of both mid calfs. Each of them 2 wounds. The areas on the left are doing much better in fact the distal 1 looks like it is on his way to closure. The right required debridement with adherent fibrinous debris under illumination. We have been using silver collagen. Previous arterial studies were within normal limits he also had venous reflux studies 04/03/2020 upon evaluation today patient appears to be doing excellent with regard to his lower extremity ulcers. He is going to require a little bit of debridement on the wounds on the right especially in order to clear away some biofilm and slough but this is minimal and overall he seems to be doing quite well. 8/19; the patient's wounds on the left lateral lower leg just above closed. However the 2 on the lateral part of the right have a nonviable necrotic surface. This is disappointing. There is also no change in dimensions. He has had arterial studies as well as venous reflux studies 8/26; very disappointing today. Even the more superficial areas on the left are about the same as last week. Still 2 punched-out areas completely unchanged on the right. We have been using silver collagen really making no progress. 9/2; changed to Iodoflex last week better looking wound surfaces especially on the right although they are deep and punched out. The areas on the left are more superficial open 1 of these is almost fully epithelialized although it has been this way for at least 2 weeks. He has a 20% co-pay [Medicare only] unaffordable for a skin substitute. Had some thought about a snap VAC on the deep areas on the right leg we will try to put this through in  the insurance 9/9; punched-out wounds on the bilateral lateral lower extremities. We have been operating as if the these were chronic venous wounds with secondary lymphedema. The areas on the left have been doing well in fact one of them is closed over. We have had no improvement in the areas on the right we have been using Iodoflex to help with surface debridement. The areas on the right are deeper wider. I do not see any evidence of infection. Home health is not been putting the wraps on high enough he has localized lymphedema right above the wounds. He has had arterial and venous studies already 9/16; punched-out mirror-image wounds on his bilateral lateral mid calfs. We have 1 closed on the left lateral the other smaller. For the first time today the areas on the right lateral actually looks some better. I did a biopsy of 1 of these last time although we still do not have this result. HOWEVER he arrives in clinic today complaining of chest pain overnight which felt like a brick on his chest. He also had 2 black watery bowel movements. He does not have nausea or vomiting. He is not describing abdominal pain. He  has no prior history of diarrhea heartburn etc. 9/30; sent this patient to the ER on 9/16. 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 He is on twice daily PPIs Protonix. His admission hemoglobin was exceptionally low at 5. 8.. Discharged at 8.1. 4 unit transfusion. 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 His diuretic dose was actually decreased because of elevated creatinine currently at torsemide 20 mg not felt to be a Coumadin candidate. Patient states he feels well. The areas on his left leg are healed. The areas on the right look better. These have been very refractory wounds. He does not have stockings we will make arrangements for this today 10/7; the areas on the left leg are healed  we will transition him into his 20/30 stocking today. Continued improvement in the areas on the right lateral leg 10/21; the areas on the left leg remain healed at this 2-week follow-up however there is a history that he developed some drainage from one of the wound sites with home health therefore going ahead and wrapping him. He was supposed to be on 20/30 stockings although the history here is vague and I am not sure that this represents a stocking failure or not. We have been wrapping his right leg with the one remaining wound is the distal wound. We have been using silver collagen Electronic Signature(s) Signed: 06/19/2020 5:31:58 PM By: Linton Ham MD Entered By: Linton Ham on 06/19/2020 09:09:55 -------------------------------------------------------------------------------- Physical Exam Details Patient Name: Date of Service: [place] 06/19/2020 8:00 A M Medical Record Number: [id number] Patient Account Number: [id number] Date of Birth/Sex: Treating RN: March 13, 1941 (79 y.o. [name], [name] Primary Care Provider: [name]., [name] Other Clinician: Referring Provider: Treating Provider/Extender: [name], [name] in Treatment: 36 Constitutional Sitting or standing Blood Pressure is within target range for patient.. Pulse regular and within target range for patient.Marland Kitchen Respirations regular, non-labored and within target range.. Temperature is normal and within the target range for the patient.Marland Kitchen Appears in no distress. Notes Wound exam; his areas on the left are totally healed. His edema control is good. On the right his proximal wound is healed however the distal 1 is still open under illumination with a necrotic surface which would be nonviable for healing I therefore used a #5 curette to remove necrotic subcutaneous tissue hemostasis with direct pressure. We have good edema control on both sides. No arterial Electronic  Signature(s) Signed: 06/19/2020 5:31:58 PM By: Linton Ham MD Entered By: Linton Ham on 06/19/2020 09:11:05 -------------------------------------------------------------------------------- Physician Orders Details Patient Name: Date of Service: [place] 06/19/2020 8:00 [place] Record Number: [id number] Patient Account Number: [id number] Date of Birth/Sex: Treating RN: 07-26-1941 (79 y.o. 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 Dressing Change Frequency Other: - all  wounds - change twice a week by home health Skin Barriers/Peri-Wound Care Moisturizing lotion - patient to lotion left leg daily. lotion right leg with dressing changes. Wound Cleansing May shower with protection. - use cast protectors on the days dressings are not changed. May shower and wash wound with soap and water. - with dressing changes only. Primary Wound Dressing Wound #4 Right,Distal,Lateral Lower Leg Silver Collagen - moisten with hydrogel. Secondary Dressing Dry Gauze - all wounds Edema Control 4 layer compression - Right Lower Extremity Avoid standing for long periods of time Elevate legs to the level of the heart or above for 30 minutes daily and/or when sitting, a frequency of: - throughout the  day. Exercise regularly Support Garment 20-30 mm/Hg pressure to: - patient to wear compression stocking to left leg. Apply in the morning and remove at night. [facility] for wound care. [facility] home health Electronic Signature(s) Signed: 06/19/2020 5:31:58 PM By: Linton Ham MD Signed: 06/19/2020 5:47:32 PM By: Deon Pilling Entered By: Deon Pilling on 06/19/2020 08:47:54 -------------------------------------------------------------------------------- Problem List Details Patient Name: Date of Service: [place] 06/19/2020 8:00 A M Medical Record Number: [id number] Patient Account Number: [id number] Date of Birth/Sex: Treating RN: 04/21/1941 (79 y.o. 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/06/2020 No Yes L97.812 Non-pressure chronic ulcer of other part of right lower leg with fat layer 01/03/2020 No Yes exposed S90.812D Abrasion, left foot, subsequent encounter 02/07/2020 No Yes I42.9 Cardiomyopathy, unspecified 05/15/2020 No Yes K92.1 [place]ena 05/15/2020 No Yes Inactive Problems ICD-10 Code Description Active Date Inactive Date L97.521 Non-pressure chronic ulcer of other part of left foot limited to breakdown of skin 10/11/2019 10/11/2019 Resolved Problems ICD-10 Code Description Active Date Resolved Date 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 10/11/2019 10/11/2019 Electronic Signature(s) Signed: 06/19/2020 5:31:58  PM By: Linton Ham MD Entered By: Linton Ham on 06/19/2020 09:08:26 -------------------------------------------------------------------------------- Progress Note Details Patient Name: Date of Service: [place] 06/19/2020 8:00 [place] Record Number: [id number] Patient Account Number: [id number] Date of Birth/Sex: Treating RN: 08-14-1941 (79 y.o. [name], [name] Primary Care Provider: [name]., [name] Other Clinician: Referring Provider: Treating Provider/Extender: [name], [name] in Treatment: 36 Subjective History of Present Illness (HPI) ADMISSION 10/11/2019 This is a 79 year old man with a history of a severe cardiomyopathy with an ejection fraction of about 20%, chronic renal failure stage III. He is listed as a type II diabetic in epic although the patient denies this. He also has a history of PVD. He states for the last month he has had wounds on his bilateral lower extremities that started off as blisters which denuded. He has areas on the left lateral calf and 2 on the right lateral. He has an area on the left first met head which he did not know was there he we identified this on intake. He has been using Silvadene cream provided by his primary care physician but he is complaining that this burns. Past medical history; acute on chronic  congestive heart failure with a severe cardiomyopathy, history of hypoalbuminemia with an albumin of 1.9 in November, on chronic Coumadin at this point for reasons that are not totally clear, listed as a type II diabetic although the patient denies this, chronic kidney disease stage III, cholangitis with an acute hospital admission from 10/19 through 07/08/2019. He was acutely ill at that time complicating GI bleed. ABIs in our clinic were 1.16 on the right and 1.13 on the left 2/25; the patient comes in with his areas on the left lateral and right lateral calf. There is also an area over the  left first MTP bunion deformity. We have been using Sorbact. His edema control is fairly good 3/4; left lateral and right lateral calf. Most of his wounds are in the same position tightly adherent nonviable debris. On the right we debrided the superior wound on the left both wounds. The area on his bunion over the left first MTP medially is I think just about closed. We have been using Sorbact without a lot of success changed to Iodoflex under compression 3/11; left lateral and right lateral calf perhaps minor improvement in the surface condition. We have been using Iodoflex. The area over the bunion of the left first MTP has closed over 3/18; left lateral and right lateral calf not much improvement. We have been using Iodoflex. Aggressive debridement last week. 3/25; left lateral and right lateral calf. We have been using Iodoflex. There is some improvement in the superior area on the right and 2 on the lateral left although there is still a lot of debris on the surface. The inferior area on the right is still a completely nonviable surface. 4/8; left lateral and right lateral calfs. Essentially mirror-image looking wounds 2 wounds on each side in close juxtaposition we have been using Iodoflex with some improvement in the very adherent fibrinous debris but not a lot. The patient has arterial studies next Wednesday morning and venous studies next Thursday morning. I have been avoiding any further aggressive debridement until we see the arterial study results. His ABIs were fairly good in this clinic and is dorsalis pedis pulses are palpable but the wound beds are pale. 4/15; left lateral and right lateral calfs. Essentially mirror-image wounds with 2 wounds on each side in close juxtaposition but with rims of separating normal tissue. We have been using Iodoflex. The base of the wounds has been cleaning up quite nicely ARTERIAL STUDIES were done showing the patient had an ABI on the right of 1.27  with triphasic waveforms and a TBI of 0.90 on the left triphasic waveforms with an ABI of 1.28 and a TBI of 0.87. No evidence of arterial disease VENOUS REFLUX STUDIES; were done yesterday we do not have this report yet. 4/23; VENOUS REFLUX STUDIES did not show any significant reflux right lower extremity. No evidence of a DVT He did have significant reflux in the common . femoral vein on the left but nothing else was listed as significant. He did not have a DVT. We have been using Iodoflex under compression his wounds are making progress. 4/29; improvements in the wound surface continue. We have been using Iodoflex. He has a 20% out-of-pocket co-pay for Apligraf which is unfortunate. We changed him to Sorbact today 5/6; started on Sorbact last week. Better looking wound surface but not much change in dimensions. 01/24/20-Patient is back at 3 weeks, wound surfaces are about the same but very minimal slough on the right leg wounds, however there is  blistering on both legs adjacent to the wounds, patient denies any other symptoms or new symptoms. His studies have been reviewed and do not indicate any significant arterial or venous disease. But he is attending once in 3 weeks with home health changing in between 6/3; patient has 4 wounds 2 on each side of his lateral lower legs. These wounds are somewhat improved. He apparently arrived in clinic last week with a large blister on the right lateral lower leg that had denuded into the wound. They changed to silver alginate. Still under compression. He has straight Medicare and unfortunately has an unlimited co-pay for advanced treatment options. 6/10; his original 4 wounds are all better especially on the left lateral lower leg. Areas on the right medial have a better looking surface were using silver collagen ooThe blister on the right lateral lower leg from last week has an open area however this looks fairly healthy were using silver alginate on  this ooHe comes in today having "stubbed" his left second toe he has an abrasion at the base of the nailbed I think he probably caught the nail which is mycotic. 6/24; blister on the right lateral lower leg has healed. There is no additional wounds. The traumatic left second toe is also closed. The 4 that he has are all better 7/1; all of the patient's wounds are smaller except for the proximal one on the left lateral calf. We are using silver collagen 7/22; patient has home health. Relatively refractory wounds on the lateral part of both mid calfs. Each of them 2 wounds. The areas on the left are doing much better in fact the distal 1 looks like it is on his way to closure. The right required debridement with adherent fibrinous debris under illumination. We have been using silver collagen. Previous arterial studies were within normal limits he also had venous reflux studies 04/03/2020 upon evaluation today patient appears to be doing excellent with regard to his lower extremity ulcers. He is going to require a little bit of debridement on the wounds on the right especially in order to clear away some biofilm and slough but this is minimal and overall he seems to be doing quite well. 8/19; the patient's wounds on the left lateral lower leg just above closed. However the 2 on the lateral part of the right have a nonviable necrotic surface. This is disappointing. There is also no change in dimensions. He has had arterial studies as well as venous reflux studies 8/26; very disappointing today. Even the more superficial areas on the left are about the same as last week. Still 2 punched-out areas completely unchanged on the right. We have been using silver collagen really making no progress. 9/2; changed to Iodoflex last week better looking wound surfaces especially on the right although they are deep and punched out. The areas on the left are more superficial open 1 of these is almost fully epithelialized  although it has been this way for at least 2 weeks. He has a 20% co-pay [Medicare only] unaffordable for a skin substitute. Had some thought about a snap VAC on the deep areas on the right leg we will try to put this through in the insurance 9/9; punched-out wounds on the bilateral lateral lower extremities. We have been operating as if the these were chronic venous wounds with secondary lymphedema. The areas on the left have been doing well in fact one of them is closed over. We have had no improvement in the areas  on the right we have been using Iodoflex to help with surface debridement. The areas on the right are deeper wider. I do not see any evidence of infection. Home health is not been putting the wraps on high enough he has localized lymphedema right above the wounds. He has had arterial and venous studies already 9/16; punched-out mirror-image wounds on his bilateral lateral mid calfs. We have 1 closed on the left lateral the other smaller. For the first time today the areas on the right lateral actually looks some better. I did a biopsy of 1 of these last time although we still do not have this result. HOWEVER he arrives in clinic today complaining of chest pain overnight which felt like a brick on his chest. He also had 2 black watery bowel movements. He does not have nausea or vomiting. He is not describing abdominal pain. He has no prior history of diarrhea heartburn etc. 9/30; sent this patient to the ER on 9/16. 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 He is on twice daily PPIs Protonix. His admission hemoglobin was exceptionally low at 5. 8.. Discharged at 8.1. 4 unit transfusion. 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 His diuretic dose was actually decreased because of elevated creatinine currently at torsemide 20 mg not felt to be a Coumadin candidate. Patient states he feels  well. The areas on his left leg are healed. The areas on the right look better. These have been very refractory wounds. He does not have stockings we will make arrangements for this today 10/7; the areas on the left leg are healed we will transition him into his 20/30 stocking today. Continued improvement in the areas on the right lateral leg 10/21; the areas on the left leg remain healed at this 2-week follow-up however there is a history that he developed some drainage from one of the wound sites with home health therefore going ahead and wrapping him. He was supposed to be on 20/30 stockings although the history here is vague and I am not sure that this represents a stocking failure or not. We have been wrapping his right leg with the one remaining wound is the distal wound. We have been using silver collagen Objective Constitutional Sitting or standing Blood Pressure is within target range for patient.. Pulse regular and within target range for patient.Marland Kitchen Respirations regular, non-labored and within target range.. Temperature is normal and within the target range for the patient.Marland Kitchen Appears in no distress. Vitals Time Taken: 7:50 AM, Height: 71 in, Weight: 198 lbs, BMI: 27.6, Temperature: 97.6 F, Pulse: 97 bpm, Respiratory Rate: 18 breaths/min, Blood Pressure: 145/77 mmHg. General Notes: Wound exam; his areas on the left are totally healed. His edema control is good. ooOn the right his proximal wound is healed however the distal 1 is still open under illumination with a necrotic surface which would be nonviable for healing I therefore used a #5 curette to remove necrotic subcutaneous tissue hemostasis with direct pressure. We have good edema control on both sides. No arterial Integumentary (Hair, Skin) Wound #3 status is Healed - Epithelialized. Original cause of wound was Blister. The wound is located on the Right,Proximal,Lateral Lower Leg. The wound measures 0cm length x 0cm width x 0cm  depth; 0cm^2 area and 0cm^3 volume. Wound #4 status is Open. Original cause of wound was Blister. The wound is located on the Right,Distal,Lateral Lower Leg.  The wound measures 1.9cm length x 1.2cm width x 0.3cm depth; 1.791cm^2 area and 0.537cm^3 volume. There is Fat Layer (Subcutaneous Tissue) exposed. There is no tunneling or undermining noted. There is a medium amount of serosanguineous drainage noted. The wound margin is well defined and not attached to the wound base. There is large (67- 100%) red, pink granulation within the wound bed. There is a small (1-33%) amount of necrotic tissue within the wound bed including Adherent Slough. Assessment Active Problems ICD-10 Chronic venous hypertension (idiopathic) with inflammation of bilateral lower extremity Lymphedema, not elsewhere classified Non-pressure chronic ulcer of other part of left lower leg with fat layer exposed Non-pressure chronic ulcer of other part of right lower leg with fat layer exposed Abrasion, left foot, subsequent encounter Cardiomyopathy, unspecified [facility] Procedures Wound #4 Pre-procedure diagnosis of Wound #4 is a Venous Leg Ulcer located on the Right,Distal,Lateral Lower Leg .Severity of Tissue Pre Debridement is: Fat layer exposed. There was a Excisional Skin/Subcutaneous Tissue Debridement with a total area of 2.28 sq cm performed by Ricard Dillon., MD. With the following instrument(s): Curette to remove Viable and Non-Viable tissue/material. Material removed includes Subcutaneous Tissue, Slough, Skin: Dermis, and Fibrin/Exudate after achieving pain control using Lidocaine 4% Topical Solution. A time out was conducted at 08:40, prior to the start of the procedure. A Moderate amount of bleeding was controlled with Pressure. The procedure was tolerated well with a pain level of 0 throughout and a pain level of 0 following the procedure. Post Debridement Measurements: 1.9cm length x 1.2cm width x 0.3cm depth;  0.537cm^3 volume. Character of Wound/Ulcer Post Debridement is improved. Severity of Tissue Post Debridement is: Fat layer exposed. 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 There was a Four Layer Compression Therapy Procedure with a pre-treatment ABI of 1.1 by Baruch Gouty, RN. Post procedure Diagnosis Wound #4: Same as Pre-Procedure Plan Follow-up Appointments: Return Appointment in 1 week. Dressing Change Frequency: Other: - all wounds - change twice a week by home health Skin Barriers/Peri-Wound Care: Moisturizing lotion - patient to lotion left leg daily. lotion right leg with dressing changes. Wound Cleansing: May shower with protection. - use cast protectors on the days dressings are not changed. May shower and wash wound with soap and water. - with dressing changes only. Primary Wound Dressing: Wound #4 Right,Distal,Lateral Lower Leg: Silver Collagen - moisten with hydrogel. Secondary Dressing: Dry Gauze - all wounds Edema Control: 4 layer compression - Right Lower Extremity Avoid standing for long periods of time Elevate legs to the level of the heart or above for 30 minutes daily and/or when sitting, a frequency of: - throughout the day. Exercise regularly Support Garment 20-30 mm/Hg pressure to: - patient to wear compression stocking to left leg. Apply in the morning and remove at night. Home Health: [facility] for wound care. - [facility]. I have continued silver collagen to his 1 remaining wound on the right still under 4-layer compression on the right 2. I put him back into 20/30 below-knee stockings on the left we did not wrap this leg today. If there is a reason to rewrap him by home health I had like to know what that is and to know that he has been compliant with 20/30 stockings. If that turns out to be true  he is going to need either a more aggressive stocking or perhaps  external compression stockings. T oday there is nothing open here. Electronic Signature(s) Signed: 06/19/2020 5:31:58 PM By: Linton Ham MD Entered By: Linton Ham on 06/19/2020 09:12:30 -------------------------------------------------------------------------------- SuperBill Details Patient Name: Date of Service: [place] [facility] 06/19/2020 Medical Record Number: [id number] Patient Account Number: [id number] Date of Birth/Sex: Treating RN: 20-Feb-1941 (79 y.o. [name], [name] Primary Care Provider: [name]., [name] Other Clinician: Referring Provider: Treating Provider/Extender: [name], [name] in Treatment: 36 Diagnosis Coding ICD-10 Codes Code Description [phone] Chronic venous hypertension (idiopathic) with inflammation of bilateral lower extremity I89.0 Lymphedema, not elsewhere classified L97.822 Non-pressure chronic ulcer of other part of left lower leg with fat layer exposed L97.812 Non-pressure chronic ulcer of other part of right lower leg with fat layer exposed S90.812D Abrasion, left foot, subsequent encounter I42.9 Cardiomyopathy, unspecified K92.1 [facility] Facility Procedures The patient participates with Medicare or their insurance follows the Medicare Facility Guidelines: CPT4 Code Description Modifier Quantity [id number] 11042 - DEB SUBQ TISSUE 20 SQ CM/< 1 ICD-10 Diagnosis Description L97.471 Non-pressure chronic ulcer of  other part of right lower leg with fat layer exposed Physician Procedures : CPT4 Code Description Modifier [id number] - WC PHYS SUBQ TISS 20 SQ CM ICD-10 Diagnosis Description K93.552 Non-pressure chronic ulcer of other part of right lower leg with fat layer exposed Quantity: 1 Electronic Signature(s) Signed: 06/19/2020 5:31:58 PM By: Linton Ham MD Entered By: Linton Ham on 06/19/2020 09:12:40

## 2020-06-26 ENCOUNTER — Other Ambulatory Visit: Payer: Self-pay

## 2020-06-26 ENCOUNTER — Encounter (HOSPITAL_BASED_OUTPATIENT_CLINIC_OR_DEPARTMENT_OTHER): Payer: Medicare Other | Admitting: Internal Medicine

## 2020-06-26 DIAGNOSIS — E11621 Type 2 diabetes mellitus with foot ulcer: Secondary | ICD-10-CM | POA: Diagnosis not present

## 2020-06-26 NOTE — Progress Notes (Signed)
VANNAK, MONTENEGRO ([id number]) Visit Report for 06/26/2020 HPI Details Patient Name: Date of Service: [place] 06/26/2020 7:30 A M Medical Record Number: [id number] Patient Account Number: [id number] Date of Birth/Sex: Treating RN: 01-08-1941 (79 y.o. [name], [name] Primary Care Provider: [name]., [name] Other Clinician: Referring Provider: Treating Provider/Extender: [name], [name] in Treatment: 37 History of Present Illness HPI Description: ADMISSION 10/11/2019 This is a 79 year old man with a history of a severe cardiomyopathy with an ejection fraction of about 20%, chronic renal failure stage III. He is listed as a type II diabetic in epic although the patient denies this. He also has a history of PVD. He states for the last month he has had wounds on his bilateral lower extremities that started off as blisters which denuded. He has areas on the left lateral calf and 2 on the right lateral. He has an area on the left first met head which he did not know was there he we identified this on intake. He has been using Silvadene cream provided by his primary care physician but he is complaining that this burns. 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 He was acutely ill at that time complicating GI bleed. ABIs in our clinic were 1.16 on the right and 1.13 on the left 2/25; the patient comes in with his areas on the left lateral and right lateral calf. There is also an area over the left first MTP bunion deformity. We have been using Sorbact. His edema control is fairly good 3/4; left lateral  and right lateral calf. Most of his wounds are in the same position tightly adherent nonviable debris. On the right we debrided the superior wound on the left both wounds. The area on his bunion over the left first MTP medially is I think just about closed. We have been using Sorbact without a lot of success changed to Iodoflex under compression 3/11; left lateral and right lateral calf perhaps minor improvement in the surface condition. We have been using Iodoflex. The area over the bunion of the left first MTP has closed over 3/18; left lateral and right lateral calf not much improvement. We have been using Iodoflex. Aggressive debridement last week. 3/25; left lateral and right lateral calf. We have been using Iodoflex. There is some improvement in the superior area on the right and 2 on the lateral left although there is still a lot of debris on the surface. The inferior area on the right is still a completely nonviable surface. 4/8; left lateral and right lateral calfs. Essentially mirror-image looking wounds 2 wounds on each side in close juxtaposition we have been using Iodoflex with some improvement in the very adherent fibrinous debris but not a lot. The patient has arterial studies next Wednesday morning and venous studies next Thursday morning. I have been avoiding any further aggressive debridement until we see the arterial study results. His ABIs were fairly good in this clinic and is dorsalis pedis pulses are palpable but the wound beds are pale. 4/15; left lateral and right lateral calfs. Essentially mirror-image wounds with 2 wounds on each side in close juxtaposition  but with rims of separating normal tissue. We have been using Iodoflex. The base of the wounds has been cleaning up quite nicely ARTERIAL STUDIES were done showing the patient had an ABI on the right of 1.27 with triphasic waveforms and a TBI of 0.90 on the left triphasic waveforms with an ABI of 1.28 and a TBI of 0.87.  No evidence of arterial disease VENOUS REFLUX STUDIES; were done yesterday we do not have this report yet. 4/23; VENOUS REFLUX STUDIES did not show any significant reflux right lower extremity. No evidence of a DVT He did have significant reflux in the common . femoral vein on the left but nothing else was listed as significant. He did not have a DVT. We have been using Iodoflex under compression his wounds are making progress. 4/29; improvements in the wound surface continue. We have been using Iodoflex. He has a 20% out-of-pocket co-pay for Apligraf which is unfortunate. We changed him to Sorbact today 5/6; started on Sorbact last week. Better looking wound surface but not much change in dimensions. 01/24/20-Patient is back at 3 weeks, wound surfaces are about the same but very minimal slough on the right leg wounds, however there is blistering on both legs adjacent to the wounds, patient denies any other symptoms or new symptoms. His studies have been reviewed and do not indicate any significant arterial or venous disease. But he is attending once in 3 weeks with home health changing in between 6/3; patient has 4 wounds 2 on each side of his lateral lower legs. These wounds are somewhat improved. He apparently arrived in clinic last week with a large blister on the right lateral lower leg that had denuded into the wound. They changed to silver alginate. Still under compression. He has straight Medicare and unfortunately has an unlimited co-pay for advanced treatment options. 6/10; his original 4 wounds are all better especially on the left lateral lower leg. 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 6/24; blister on the right lateral lower leg has healed. There is no additional wounds. The traumatic left second toe is also closed. The 4 that he has are all better 7/1; all of the patient's wounds are smaller except for the proximal one on the left lateral calf. We are using silver collagen 7/22; patient has home health. Relatively refractory wounds on the lateral part of both mid calfs. Each of them 2 wounds. The areas on the left are doing much better in fact the distal 1 looks like it is on his way to closure. The right required debridement with adherent fibrinous debris under illumination. We have been using silver collagen. Previous arterial studies were within normal limits he also had venous reflux studies 04/03/2020 upon evaluation today patient appears to be doing excellent with regard to his lower extremity ulcers. He is going to require a little bit of debridement on the wounds on the right especially in order to clear away some biofilm and slough but this is minimal and overall he seems to be doing quite well. 8/19; the patient's wounds on the left lateral lower leg just above closed. However the 2  on the lateral part of the right have a nonviable necrotic surface. This is disappointing. There is also no change in dimensions. He has had arterial studies as well as venous reflux studies 8/26; very disappointing today. Even the more superficial areas on the left are about the same as last week. Still 2 punched-out areas completely unchanged on the right. We have been using silver collagen really making no progress. 9/2; changed to Iodoflex last week better looking wound surfaces especially on the right although they are deep and punched out. The areas on the left are more superficial open 1 of these is almost fully epithelialized although it has been this way for at least 2 weeks. He has a 20% co-pay [Medicare only] unaffordable for a skin  substitute. Had some thought about a snap VAC on the deep areas on the right leg we will try to put this through in the insurance 9/9; punched-out wounds on the bilateral lateral lower extremities. We have been operating as if the these were chronic venous wounds with secondary lymphedema. The areas on the left have been doing well in fact one of them is closed over. We have had no improvement in the areas on the right we have been using Iodoflex to help with surface debridement. The areas on the right are deeper wider. I do not see any evidence of infection. Home health is not been putting the wraps on high enough he has localized lymphedema right above the wounds. He has had arterial and venous studies already 9/16; punched-out mirror-image wounds on his bilateral lateral mid calfs. We have 1 closed on the left lateral the other smaller. For the first time today the areas on the right lateral actually looks some better. I did a biopsy of 1 of these last time although we still do not have this result. HOWEVER he arrives in clinic today complaining of chest pain overnight which felt like a brick on his chest. He also had 2 black watery bowel movements. He does not have nausea or vomiting. He is not describing abdominal pain. He has no prior history of diarrhea heartburn etc. 9/30; sent this patient to the ER on 9/16. 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 He is on twice daily PPIs Protonix. His admission hemoglobin was exceptionally low at 5. 8.. Discharged at 8.1. 4 unit transfusion. 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 His diuretic dose was actually decreased because of elevated creatinine currently at torsemide 20 mg not felt to be a Coumadin candidate. Patient states he feels well. The areas on his left leg are healed. The areas on the right look better. These have been very refractory  wounds. He does not have stockings we will make arrangements for this today 10/7; the areas on the left leg are healed we will transition him into his 20/30 stocking today. Continued improvement in the areas on the right lateral leg 10/21; the areas on the left leg remain healed at this 2-week follow-up however there is a history that he developed some drainage from one of the wound sites with home health therefore going ahead and wrapping him. He was supposed to be on 20/30 stockings although the history here is vague and I am not sure that this represents a stocking failure or not. We have been wrapping his right leg with the one remaining wound is the distal wound. We  have been using silver collagen 10/28 the patient only has 1 wound remaining on the right lower calf. This still has 3 mm of depth which is unchanged but the surface area is smaller. The superior wound on the right lateral is closed. The 2 wounds on the left remain closed and he is using his compression stocking Electronic Signature(s) Signed: 06/26/2020 5:01:15 PM By: Linton Ham MD Entered By: Linton Ham on 06/26/2020 09:16:23 -------------------------------------------------------------------------------- Physical Exam Details Patient Name: Date of Service: [place] 06/26/2020 7:30 [place] Record Number: [id number] Patient Account Number: [id number] Date of Birth/Sex: Treating RN: April 30, 1941 (79 y.o. [name], [name] Primary Care Provider: [name]., [name] Other Clinician: Referring Provider: Treating Provider/Extender: [name], [name] in Treatment: 37 Notes Wound exam; the area on the left is totally healed and he is using his stocking On the right proximal wound remains healed. The distal 1 is still open. The surface of this is still not been great looking but I elected not to do debridement today to see if silver collagen and autologous debridement can take care  of the issue. Electronic Signature(s) Signed: 06/26/2020 5:01:15 PM By: Linton Ham MD Entered By: Linton Ham on 06/26/2020 09:17:12 -------------------------------------------------------------------------------- Physician Orders Details Patient Name: Date of Service: [place] 06/26/2020 7:30 [place] Record Number: [id number] Patient Account Number: [id number] Date of Birth/Sex: Treating RN: May 28, 1941 (79 y.o. 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 Dressing Change Frequency Other: - all wounds - change twice a week by home health Skin Barriers/Peri-Wound Care Moisturizing lotion - patient to lotion left leg daily. lotion right leg with dressing changes. Wound Cleansing May shower with protection. - use cast protectors on the days dressings are not changed. May shower and wash wound with soap and water. - with dressing changes only. Primary Wound Dressing Wound #4 Right,Distal,Lateral Lower Leg Silver Collagen - moisten with hydrogel. Secondary Dressing Dry Gauze - all wounds Edema Control 4 layer compression - Right Lower Extremity - Please ensure to wrap just above the toes and just the below the knee. Avoid standing for long periods of time Elevate legs to the level  of the heart or above for 30 minutes daily and/or when sitting, a frequency of: - throughout the day. Exercise regularly Support Garment 20-30 mm/Hg pressure to: - patient to wear compression stocking to left leg. Apply in the morning and remove at night. [facility] for wound care. [facility] home [facility] Electronic Signature(s) Signed: 06/26/2020 5:01:15 PM By: Linton Ham MD Signed: 06/26/2020 5:24:43 PM By: Deon Pilling Entered By: Deon Pilling on 06/26/2020 08:57:59 -------------------------------------------------------------------------------- Problem List Details Patient Name: Date of Service: [place] 06/26/2020 7:30 [place] Record Number: [id number] Patient Account Number: [id number] Date of Birth/Sex: Treating RN: May 02, 1941 (79 y.o. 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/06/2020 No Yes L97.812 Non-pressure chronic ulcer of other part of right lower leg with fat layer 01/03/2020 No Yes exposed S90.812D Abrasion, left foot, subsequent encounter 02/07/2020 No Yes I42.9 Cardiomyopathy, unspecified 05/15/2020 No Yes K92.1 Melena 05/15/2020 No Yes Inactive Problems ICD-10 Code Description Active Date Inactive Date L97.521 Non-pressure chronic ulcer of other part of left foot limited to breakdown of skin 10/11/2019 10/11/2019 Resolved Problems ICD-10 Code Description Active Date Resolved Date L97.112 Non-pressure chronic ulcer of right  thigh with fat layer exposed 10/11/2019 10/11/2019 Electronic Signature(s) Signed: 06/26/2020 5:01:15 PM By: Linton Ham MD Entered By: Linton Ham on 06/26/2020 09:15:13 -------------------------------------------------------------------------------- Progress Note Details Patient Name: Date of Service: [place] 06/26/2020 7:30 [place] Record Number: [id number] Patient Account Number: [id number] Date of Birth/Sex: Treating RN: [birth year] (79 y.o. [name], [name] Primary Care Provider: [name]., [name] Other Clinician: Referring Provider: Treating Provider/Extender: [name], [name] in Treatment: 43 Subjective History of Present Illness (HPI) ADMISSION 10/11/2019 This is a 79 year old man with a history of a severe cardiomyopathy with an ejection fraction of about 20%, chronic renal failure stage III. He is listed as a type II diabetic in epic although the patient denies this. He also has a history of PVD. He states for the last month he has had wounds on his bilateral lower extremities that started off as blisters which denuded. He has areas on the left lateral calf and 2 on the right lateral. He has an area on the left first met head which he did not know was there he we identified this on intake. He has been using Silvadene cream provided by his primary care physician but he is complaining that this burns. 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 He was acutely ill at that time complicating GI bleed. ABIs in our clinic were 1.16 on the right and 1.13 on the left 2/25; the patient  comes in with his areas on the left lateral and right lateral calf. There is also an area over the left first MTP bunion deformity. We have been using Sorbact. His edema control is fairly good 3/4; left lateral and right lateral calf. Most of his wounds are in the same position tightly adherent nonviable debris. On the right we debrided the superior wound on the left both wounds. The area on his bunion over the left first MTP medially is I think just about closed. We have been using Sorbact without a lot of success changed to Iodoflex under compression 3/11; left lateral and right lateral calf perhaps minor improvement in the surface condition. We have been using Iodoflex. The area over the bunion of the left first MTP has closed over 3/18; left lateral and right lateral  calf not much improvement. We have been using Iodoflex. Aggressive debridement last week. 3/25; left lateral and right lateral calf. We have been using Iodoflex. There is some improvement in the superior area on the right and 2 on the lateral left although there is still a lot of debris on the surface. The inferior area on the right is still a completely nonviable surface. 4/8; left lateral and right lateral calfs. Essentially mirror-image looking wounds 2 wounds on each side in close juxtaposition we have been using Iodoflex with some improvement in the very adherent fibrinous debris but not a lot. The patient has arterial studies next Wednesday morning and venous studies next Thursday morning. I have been avoiding any further aggressive debridement until we see the arterial study results. His ABIs were fairly good in this clinic and is dorsalis pedis pulses are palpable but the wound beds are pale. 4/15; left lateral and right lateral calfs. Essentially mirror-image wounds with 2 wounds on each side in close juxtaposition but with rims of separating normal tissue. We have been using Iodoflex. The base of the wounds has been cleaning  up quite nicely ARTERIAL STUDIES were done showing the patient had an ABI on the right of 1.27 with triphasic waveforms and a TBI of 0.90 on the left triphasic waveforms with an ABI of 1.28 and a TBI of 0.87. No evidence of arterial disease VENOUS REFLUX STUDIES; were done yesterday we do not have this report yet. 4/23; VENOUS REFLUX STUDIES did not show any significant reflux right lower extremity. No evidence of a DVT He did have significant reflux in the common . femoral vein on the left but nothing else was listed as significant. He did not have a DVT. We have been using Iodoflex under compression his wounds are making progress. 4/29; improvements in the wound surface continue. We have been using Iodoflex. He has a 20% out-of-pocket co-pay for Apligraf which is unfortunate. We changed him to Sorbact today 5/6; started on Sorbact last week. Better looking wound surface but not much change in dimensions. 01/24/20-Patient is back at 3 weeks, wound surfaces are about the same but very minimal slough on the right leg wounds, however there is blistering on both legs adjacent to the wounds, patient denies any other symptoms or new symptoms. His studies have been reviewed and do not indicate any significant arterial or venous disease. But he is attending once in 3 weeks with home health changing in between 6/3; patient has 4 wounds 2 on each side of his lateral lower legs. These wounds are somewhat improved. He apparently arrived in clinic last week with a large blister on the right lateral lower leg that had denuded into the wound. They changed to silver alginate. Still under compression. He has straight Medicare and unfortunately has an unlimited co-pay for advanced treatment options. 6/10; his original 4 wounds are all better especially on the left lateral lower leg. Areas on the right medial have a better looking surface were using silver collagen ooThe blister on the right lateral lower leg from  last week has an open area however this looks fairly healthy were using silver alginate on this ooHe comes in today having "stubbed" his left second toe he has an abrasion at the base of the nailbed I think he probably caught the nail which is mycotic. 6/24; blister on the right lateral lower leg has healed. There is no additional wounds. The traumatic left second toe is also closed. The 4 that he has  are all better 7/1; all of the patient's wounds are smaller except for the proximal one on the left lateral calf. We are using silver collagen 7/22; patient has home health. Relatively refractory wounds on the lateral part of both mid calfs. Each of them 2 wounds. The areas on the left are doing much better in fact the distal 1 looks like it is on his way to closure. The right required debridement with adherent fibrinous debris under illumination. We have been using silver collagen. Previous arterial studies were within normal limits he also had venous reflux studies 04/03/2020 upon evaluation today patient appears to be doing excellent with regard to his lower extremity ulcers. He is going to require a little bit of debridement on the wounds on the right especially in order to clear away some biofilm and slough but this is minimal and overall he seems to be doing quite well. 8/19; the patient's wounds on the left lateral lower leg just above closed. However the 2 on the lateral part of the right have a nonviable necrotic surface. This is disappointing. There is also no change in dimensions. He has had arterial studies as well as venous reflux studies 8/26; very disappointing today. Even the more superficial areas on the left are about the same as last week. Still 2 punched-out areas completely unchanged on the right. We have been using silver collagen really making no progress. 9/2; changed to Iodoflex last week better looking wound surfaces especially on the right although they are deep and punched out.  The areas on the left are more superficial open 1 of these is almost fully epithelialized although it has been this way for at least 2 weeks. He has a 20% co-pay [Medicare only] unaffordable for a skin substitute. Had some thought about a snap VAC on the deep areas on the right leg we will try to put this through in the insurance 9/9; punched-out wounds on the bilateral lateral lower extremities. We have been operating as if the these were chronic venous wounds with secondary lymphedema. The areas on the left have been doing well in fact one of them is closed over. We have had no improvement in the areas on the right we have been using Iodoflex to help with surface debridement. The areas on the right are deeper wider. I do not see any evidence of infection. Home health is not been putting the wraps on high enough he has localized lymphedema right above the wounds. He has had arterial and venous studies already 9/16; punched-out mirror-image wounds on his bilateral lateral mid calfs. We have 1 closed on the left lateral the other smaller. For the first time today the areas on the right lateral actually looks some better. I did a biopsy of 1 of these last time although we still do not have this result. HOWEVER he arrives in clinic today complaining of chest pain overnight which felt like a brick on his chest. He also had 2 black watery bowel movements. He does not have nausea or vomiting. He is not describing abdominal pain. He has no prior history of diarrhea heartburn etc. 9/30; sent this patient to the ER on 9/16. 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 He is on twice daily PPIs Protonix. His admission hemoglobin was exceptionally low at 5. 8.. Discharged at 8.1. 4 unit transfusion. 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  His diuretic dose was actually decreased because of elevated creatinine  currently at torsemide 20 mg not felt to be a Coumadin candidate. Patient states he feels well. The areas on his left leg are healed. The areas on the right look better. These have been very refractory wounds. He does not have stockings we will make arrangements for this today 10/7; the areas on the left leg are healed we will transition him into his 20/30 stocking today. Continued improvement in the areas on the right lateral leg 10/21; the areas on the left leg remain healed at this 2-week follow-up however there is a history that he developed some drainage from one of the wound sites with home health therefore going ahead and wrapping him. He was supposed to be on 20/30 stockings although the history here is vague and I am not sure that this represents a stocking failure or not. We have been wrapping his right leg with the one remaining wound is the distal wound. We have been using silver collagen 10/28 the patient only has 1 wound remaining on the right lower calf. This still has 3 mm of depth which is unchanged but the surface area is smaller. The superior wound on the right lateral is closed. The 2 wounds on the left remain closed and he is using his compression stocking Objective Constitutional Vitals Time Taken: 8:13 AM, Height: 71 in, Source: Stated, Weight: 198 lbs, Source: Stated, BMI: 27.6, Respiratory Rate: 18 breaths/min. Integumentary (Hair, Skin) Wound #4 status is Open. Original cause of wound was Blister. The wound is located on the Right,Distal,Lateral Lower Leg. The wound measures 1.7cm length x 0.9cm width x 0.3cm depth; 1.202cm^2 area and 0.36cm^3 volume. There is Fat Layer (Subcutaneous Tissue) exposed. There is no tunneling or undermining noted. There is a medium amount of serosanguineous drainage noted. The wound margin is well defined and not attached to the wound base. There is large (67- 100%) pink, pale granulation within the wound bed. There is a small (1-33%) amount of  necrotic tissue within the wound bed including Adherent Slough. 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 There was a Four Layer Compression Therapy Procedure with a pre-treatment ABI of 1.2 by Baruch Gouty, RN. Post procedure Diagnosis Wound #4: Same as Pre-Procedure Plan Follow-up Appointments: Return Appointment in 1 week. Dressing Change Frequency: Other: - all wounds - change twice a week by home health Skin Barriers/Peri-Wound Care: Moisturizing lotion - patient to lotion left leg daily. lotion right leg with dressing changes. Wound Cleansing: May shower with protection. - use cast protectors on the days dressings are not changed. May shower and wash wound with soap and water. - with dressing changes only. Primary Wound Dressing: Wound #4 Right,Distal,Lateral Lower Leg: Silver Collagen - moisten with hydrogel. Secondary Dressing: Dry Gauze - all wounds Edema Control: 4 layer compression - Right Lower Extremity - Please ensure to wrap just above the toes and just the below the knee. Avoid standing for long periods of time Elevate legs to the level of the heart or above for 30 minutes daily and/or when sitting, a frequency of: -  throughout the day. Exercise regularly Support Garment 20-30 mm/Hg pressure to: - patient to wear compression stocking to left leg. Apply in the morning and remove at night. Home Health: [facility] for wound care. - [facility] 1.  Silver collagen to continue for another week. 2. He may require additional debridement if so I might change to Iodoflex. 3. I am still concerned about putting him 2 weeks with this wound I want to make sure that the depth is improving Electronic Signature(s) Signed: 06/26/2020 5:01:15 PM By: Linton Ham MD Entered By: Linton Ham on 06/26/2020 09:17:58 -------------------------------------------------------------------------------- SuperBill Details Patient Name: Date of Service: [place], [place] 06/26/2020 Medical Record Number: [id number] Patient Account Number: [id number] Date of Birth/Sex: Treating RN: 1940/12/16 (79 y.o. [name], [name] Primary Care Provider: [name]., [name] Other Clinician: Referring Provider: Treating Provider/Extender: [name], [name] in Treatment: 37 Diagnosis Coding ICD-10 Codes Code Description [phone] Chronic venous hypertension (idiopathic) with inflammation of bilateral lower extremity I89.0 Lymphedema, not elsewhere classified L97.822 Non-pressure chronic ulcer of other part of left lower leg with fat layer exposed L97.812 Non-pressure chronic ulcer of other part of right lower leg with fat layer exposed S90.812D Abrasion, left foot, subsequent encounter I42.9 Cardiomyopathy, unspecified K92.1 Melena Facility Procedures The patient participates with Medicare or their insurance follows the Medicare Facility Guidelines: CPT4 Code Description Modifier Quantity [id number] (Facility Use Only) [phone] - APPLY [place] RT LEG 1 Physician Procedures : CPT4 Code Description Modifier [id number] - WC PHYS LEVEL 3 - EST PT ICD-10 Diagnosis Description M09.470 Non-pressure chronic ulcer of other part of right lower leg with fat layer exposed I87.323 Chronic venous hypertension (idiopathic) with  inflammation of bilateral lower extremity I89.0 Lymphedema, not elsewhere classified Quantity: 1 Electronic  Signature(s) Signed: 06/26/2020 5:01:15 PM By: Linton Ham MD Entered By: Linton Ham on 06/26/2020 09:18:25

## 2020-06-30 NOTE — Progress Notes (Signed)
[name], [name] ([id number]) Visit Report for 06/26/2020 Arrival Information Details Patient Name: Date of Service: [name], [name] 06/26/2020 7:30 [place] Record Number: [id number] Patient Account Number: [id number] Date of Birth/Sex: Treating RN: May 15, 1941 (79 y.o. [name] Primary Care Glynda Soliday: [name]., [name] Other Clinician: Referring Vaishnavi Dalby: Treating Helen Cuff/Extender: [name], [name] in Treatment: 37 Visit Information History Since Last Visit Added or deleted any medications: No Patient Arrived: Cane Any new allergies or adverse reactions: No Arrival Time: 08:12 Had a fall or experienced change in No Accompanied By: self activities of daily living that may affect Transfer Assistance: None risk of falls: Patient Identification Verified: Yes Signs or symptoms of abuse/neglect since last visito No Secondary Verification Process Completed: Yes Hospitalized since last visit: No Patient Requires Transmission-Based Precautions: No Implantable device outside of the clinic excluding No Patient Has Alerts: Yes cellular tissue based products placed in the center Patient Alerts: Patient on Blood Thinner since last visit: Has Dressing in Place as Prescribed: Yes Has Compression in Place as Prescribed: Yes Pain Present Now: No Electronic Signature(s) Signed: 06/26/2020 5:19:05 PM By: Baruch Gouty RN, BSN Entered By: Baruch Gouty on 06/26/2020 08:13:02 -------------------------------------------------------------------------------- Compression Therapy Details Patient Name: Date of Service: [place] 06/26/2020 7:30 [place] Record Number: [id number] Patient Account Number: [id number] Date of Birth/Sex: Treating RN: Dec 19, 1940 (79 y.o. [name] Primary Care Morning Halberg: [name]., [name] Other Clinician: Referring Mosella Kasa: Treating Aleksia Freiman/Extender: [name], [name] in  Treatment: 37 Compression Therapy Performed for Wound Assessment: Wound #4 Right,Distal,Lateral Lower Leg Performed By: Clinician Baruch Gouty, RN Compression Type: Four Layer Pre Treatment ABI: 1.2 Post Procedure Diagnosis Same as Pre-procedure Electronic Signature(s) Signed: 06/26/2020 5:24:43 PM By: Deon Pilling Entered By: Deon Pilling on 06/26/2020 08:56:46 -------------------------------------------------------------------------------- Encounter Discharge Information Details Patient Name: Date of Service: [place] 06/26/2020 7:30 [place] Record Number: [id number] Patient Account Number: [id number] Date of Birth/Sex: Treating RN: [birth year] (79 y.o. [name] Primary Care Treasure Ingrum: [name]., [name] Other Clinician: Referring Lona Six: Treating Keyton Bhat/Extender: [name], [name] in Treatment: 37 Encounter Discharge Information Items Discharge Condition: Stable Ambulatory Status: Cane Discharge Destination: Home Transportation: Private Auto Accompanied By: alone Schedule Follow-up Appointment: Yes Clinical Summary of Care: Patient Declined Electronic Signature(s) Signed: 06/30/2020 5:57:05 PM By: Levan Hurst RN, BSN Entered By: Levan Hurst on 06/26/2020 11:41:00 -------------------------------------------------------------------------------- Lower Extremity Assessment Details Patient Name: Date of Service: [place] 06/26/2020 7:30 A M Medical Record Number: [id number] Patient Account Number: [id number] Date of Birth/Sex: Treating RN: 01-09-1941 (79 y.o. [name] Primary Care Ranvir Renovato: [name]., [name] Other Clinician: Referring Akeiba Axelson: Treating Tyesha Joffe/Extender: [name], [name] in Treatment: 37 Edema Assessment Assessed: [Left: No] [Right: No] Edema: [Left: Yes] [Right: Yes] Calf Left: Right: Point of Measurement: 53 cm From Medial Instep 38  cm 40 cm Ankle Left: Right: Point of Measurement: 14 cm From Medial Instep 22 cm 22 cm Vascular Assessment Pulses: Dorsalis Pedis Palpable: [Right:Yes] Electronic Signature(s) Signed: 06/30/2020 5:57:05 PM By: Levan Hurst RN, BSN Entered By: Levan Hurst on 06/26/2020 08:26:04 -------------------------------------------------------------------------------- Multi Wound Chart Details Patient Name: Date of Service: [place] 06/26/2020 7:30 [place] Record Number: [id number] Patient Account Number: [id number] Date of Birth/Sex: Treating RN: August 09, 1941 (78  y.o. [name] Primary Care Royale Swamy: [name]., [name] Other Clinician: Referring Ladislao Cohenour: Treating Jahdai Padovano/Extender: [name], [name] in Treatment: 37 Vital Signs Height(in): 71 Pulse(bpm): Weight(lbs): 198 Blood Pressure(mmHg): Body Mass Index(BMI): 28 Temperature(F): Respiratory Rate(breaths/min): 18 Photos: [4:No Photos Right, Distal, Lateral Lower Leg] [N/A:N/A N/A] Wound Location: [4:Blister] [N/A:N/A] Wounding Event: [4:Venous Leg Ulcer] [N/A:N/A] Primary Etiology: [4:Anemia, Congestive Heart Failure,] [N/A:N/A] Comorbid History: [4:Hypertension, Peripheral Venous Disease, 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] [N/A:N/A] Date Acquired: [4:37] [N/A:N/A] Weeks of Treatment: [4:Open] [N/A:N/A] Wound Status: [4:1.7x0.9x0.3] [N/A:N/A] Measurements L x W x D (cm) [4:1.202] [N/A:N/A] A (cm) : rea [4:0.36] [N/A:N/A] Volume (cm) : [4:87.10%] [N/A:N/A] % Reduction in Area: [4:61.30%] [N/A:N/A] % Reduction in Volume: [4:Full Thickness Without Exposed] [N/A:N/A] Classification: [4:Support Structures Medium] [N/A:N/A] Exudate Amount: [4:Serosanguineous] [N/A:N/A] Exudate Type: [4:red, brown] [N/A:N/A] Exudate Color: [4:Well defined, not attached] [N/A:N/A] Wound Margin: [4:Large (67-100%)] [N/A:N/A] Granulation Amount: [4:Pink, Pale] [N/A:N/A] Granulation Quality:  [4:Small (1-33%)] [N/A:N/A] Necrotic Amount: [4:Fat Layer (Subcutaneous Tissue): Yes N/A] Exposed Structures: [4:Fascia: No Tendon: No Muscle: No Joint: No Bone: No Small (1-33%)] [N/A:N/A] Epithelialization: [4:Compression Therapy] [N/A:N/A] Treatment Notes Electronic Signature(s) Signed: 06/26/2020 5:01:15 PM By: Linton Ham MD Signed: 06/26/2020 5:24:43 PM By: Deon Pilling Entered By: Linton Ham on 06/26/2020 09:15:37 -------------------------------------------------------------------------------- Multi-Disciplinary Care Plan Details Patient Name: Date of Service: [place] 06/26/2020 7:30 [place] Record Number: [id number] Patient Account Number: [id number] Date of Birth/Sex: Treating RN: 04/27/1941 (79 y.o. [name] Primary Care [place] Latouche: [name]., [name] Other Clinician: Referring Pleas Carneal: Treating Blossom Crume/Extender: [name], [name] in Treatment: 37 Active Inactive Venous Leg Ulcer Nursing Diagnoses: Knowledge deficit related to disease process and management Potential for venous Insuffiency (use before diagnosis confirmed) Goals: Patient will maintain optimal edema control Date Initiated: 05/01/2020 Target Resolution Date: 08/01/2020 Goal Status: Active Interventions: Assess peripheral edema status every visit. Compression as ordered Provide education on venous insufficiency Notes: Electronic Signature(s) Signed: 06/26/2020 5:24:43 PM By: Deon Pilling Entered By: Deon Pilling on 06/26/2020 08:09:27 -------------------------------------------------------------------------------- Pain Assessment Details Patient Name: Date of Service: [name] [facility] 06/26/2020 7:30 [place] Record Number: [id number] Patient Account Number: [id number] Date of Birth/Sex: Treating RN: [birth year] (79 y.o. [name] Primary Care Donnalee Cellucci: [name]., [name] Other Clinician: Referring  Calise Dunckel: Treating Cullen Vanallen/Extender: [name], [name] in Treatment: 37 Active Problems Location of Pain Severity and Description of Pain Patient Has Paino No Site Locations Rate the pain. Current Pain Level: 0 Pain Management and Medication Current Pain Management: Electronic Signature(s) Signed: 06/26/2020 5:19:05 PM By: Baruch Gouty RN, BSN Entered By: Baruch Gouty on 06/26/2020 08:13:25 -------------------------------------------------------------------------------- Patient/Caregiver Education Details Patient Name: Date of Service: [place] 10/28/2021andnbsp7:30 [place] Record Number: [id number] Patient Account Number: [id number] Date of Birth/Gender: Treating RN: 1940-10-31 (79 y.o. [name] Primary Care Physician: [name]., [name] Other Clinician: Referring Physician: Treating Physician/Extender: [name], [name] in Treatment: 37 Education Assessment Education Provided To: Patient Education Topics Provided Venous: Handouts: Controlling Swelling with Compression Stockings , Controlling Swelling with Multilayered Compression Wraps, Managing Venous Disease and Related Ulcers Methods: Explain/Verbal Responses: Reinforcements needed Electronic Signature(s) Signed: 06/26/2020 5:24:43 PM By: Deon Pilling Entered By: Deon Pilling on 06/26/2020 08:09:42 -------------------------------------------------------------------------------- Wound Assessment Details Patient Name: Date of Service: [place], [place] 06/26/2020 7:30  A M Medical Record Number: [id number] Patient Account Number: [id number] Date of Birth/Sex: Treating RN: 24-May-1941 (79 y.o. [name] Primary Care [place] Nay: [name]., [name] Other Clinician: Referring Ersilia Brawley: Treating Lannie Yusuf/Extender: [name], [name] in Treatment: 37 Wound Status Wound Number: 4 Primary Venous Leg  Ulcer Etiology: Wound Location: Right, Distal, Lateral Lower Leg Wound Open Wounding Event: Blister Status: Date Acquired: 09/11/2019 Comorbid Anemia, Congestive Heart Failure, Hypertension, Peripheral Weeks Of Treatment: 37 History: Venous Disease, End Stage Renal Disease Clustered Wound: No Photos Photo Uploaded By: Mikeal Hawthorne on 06/27/2020 11:34:03 Wound Measurements Length: (cm) 1.7 Width: (cm) 0.9 Depth: (cm) 0.3 Area: (cm) 1.202 Volume: (cm) 0.36 % Reduction in Area: 87.1% % Reduction in Volume: 61.3% Epithelialization: Small (1-33%) Tunneling: No Undermining: No Wound Description Classification: Full Thickness Without Exposed Support Structures Wound Margin: Well defined, not attached Exudate Amount: Medium Exudate Type: Serosanguineous Exudate Color: red, brown Foul Odor After Cleansing: No Slough/Fibrino Yes Wound Bed Granulation Amount: Large (67-100%) Exposed Structure Granulation Quality: Pink, Pale Fascia Exposed: No Necrotic Amount: Small (1-33%) Fat Layer (Subcutaneous Tissue) Exposed: Yes Necrotic Quality: Adherent Slough Tendon Exposed: No Muscle Exposed: No Joint Exposed: No Bone Exposed: No Treatment Notes Wound #4 (Right, Distal, Lateral Lower Leg) 1. Cleanse With Soap and water 3. Primary Dressing Applied Colle[employer] 4. Secondary Dressing Dry Gauze 6. Support Layer Applied 4 layer compression wrap Notes netting Electronic Signature(s) Signed: 06/30/2020 5:57:05 PM By: Levan Hurst RN, BSN Entered By: Levan Hurst on 06/26/2020 08:26:41 -------------------------------------------------------------------------------- [place] Details Patient Name: Date of Service: [place] 06/26/2020 7:30 [place] Record Number: [id number] Patient Account Number: [id number] Date of Birth/Sex: Treating RN: 1941-03-30 (79 y.o. [name] Primary Care Tylesha Gibeault: [name]., [name] Other Clinician: Referring  Abigail Teall: Treating [place]/Extender: [name], [name] in Treatment: 37 Vital Signs Time Taken: 08:13 Respiratory Rate (breaths/min): 18 Height (in): 71 Reference Range: 80 - 120 mg / dl Source: Stated Weight (lbs): 198 Source: Stated Body Mass Index (BMI): 27.6 Electronic Signature(s) Signed: 06/26/2020 5:19:05 PM By: Baruch Gouty RN, BSN Entered By: Baruch Gouty on 06/26/2020 08:13:16

## 2020-07-03 ENCOUNTER — Other Ambulatory Visit: Payer: Self-pay

## 2020-07-03 ENCOUNTER — Encounter (HOSPITAL_BASED_OUTPATIENT_CLINIC_OR_DEPARTMENT_OTHER): Payer: Medicare Other | Attending: Internal Medicine | Admitting: Internal Medicine

## 2020-07-03 DIAGNOSIS — I87323 Chronic venous hypertension (idiopathic) with inflammation of bilateral lower extremity: Secondary | ICD-10-CM | POA: Diagnosis not present

## 2020-07-03 DIAGNOSIS — E1151 Type 2 diabetes mellitus with diabetic peripheral angiopathy without gangrene: Secondary | ICD-10-CM | POA: Insufficient documentation

## 2020-07-03 DIAGNOSIS — K921 Melena: Secondary | ICD-10-CM | POA: Insufficient documentation

## 2020-07-03 DIAGNOSIS — S90812D Abrasion, left foot, subsequent encounter: Secondary | ICD-10-CM | POA: Diagnosis not present

## 2020-07-03 DIAGNOSIS — I890 Lymphedema, not elsewhere classified: Secondary | ICD-10-CM | POA: Insufficient documentation

## 2020-07-03 DIAGNOSIS — X58XXXD Exposure to other specified factors, subsequent encounter: Secondary | ICD-10-CM | POA: Diagnosis not present

## 2020-07-03 DIAGNOSIS — L97822 Non-pressure chronic ulcer of other part of left lower leg with fat layer exposed: Secondary | ICD-10-CM | POA: Diagnosis not present

## 2020-07-03 DIAGNOSIS — L97812 Non-pressure chronic ulcer of other part of right lower leg with fat layer exposed: Secondary | ICD-10-CM | POA: Diagnosis not present

## 2020-07-03 DIAGNOSIS — E1122 Type 2 diabetes mellitus with diabetic chronic kidney disease: Secondary | ICD-10-CM | POA: Diagnosis present

## 2020-07-03 DIAGNOSIS — I429 Cardiomyopathy, unspecified: Secondary | ICD-10-CM | POA: Insufficient documentation

## 2020-07-03 NOTE — Progress Notes (Signed)
CASY, BRUNETTO ([id number]) Visit Report for 07/03/2020 Debridement Details Patient Name: Date of Service: [place], [place] 07/03/2020 7:30 A M Medical Record Number: [id number] Patient Account Number: [id number] Date of Birth/Sex: Treating RN: 1940-11-21 (79 y.o. [name], [name] Primary Care Provider: [name]., [name] Other Clinician: Referring Provider: Treating Provider/Extender: [name], [name] in Treatment: 38 Debridement Performed for Assessment: Wound #4 Right,Distal,Lateral Lower Leg Performed By: Physician Ricard Dillon., MD Debridement Type: Debridement Severity of Tissue Pre Debridement: Fat layer exposed Level of Consciousness (Pre-procedure): Awake and Alert Pre-procedure Verification/Time Out Yes - 08:10 Taken: Start Time: 08:11 Pain Control: Lidocaine 4% T opical Solution T Area Debrided (L x W): otal 1.5 (cm) x 1 (cm) = 1.5 (cm) Tissue and other material debrided: Viable, Non-Viable, Slough, Subcutaneous, Skin: Dermis , Fibrin/Exudate, Slough Level: Skin/Subcutaneous Tissue Debridement Description: Excisional Instrument: Curette Bleeding: Minimum Hemostasis Achieved: Pressure End Time: 08:13 Procedural Pain: 0 Post Procedural Pain: 0 Response to Treatment: Procedure was tolerated well Level of Consciousness (Post- Awake and Alert procedure): Post Debridement Measurements of Total Wound Length: (cm) 1.5 Width: (cm) 1 Depth: (cm) 0.2 Volume: (cm) 0.236 Character of Wound/Ulcer Post Debridement: Improved Severity of Tissue Post Debridement: Fat layer exposed Post Procedure Diagnosis Same as Pre-procedure Electronic Signature(s) Signed: 07/03/2020 5:37:49 PM By: Linton Ham MD Signed: 07/03/2020 6:08:00 PM By: Deon Pilling Entered By: Linton Ham on 07/03/2020 08:38:19 -------------------------------------------------------------------------------- HPI Details Patient Name: Date of Service: [place] 07/03/2020 7:30 A M Medical Record Number: [id number] Patient Account Number: [id number] Date of Birth/Sex: Treating RN: [birth year] (79 y.o. [name], [name] Primary Care Provider: [name]., [name] Other Clinician: Referring Provider: Treating Provider/Extender: [name], [name] in Treatment: 47 History of Present Illness HPI Description: ADMISSION 10/11/2019 This is a 79 year old man with a history of a severe cardiomyopathy with an ejection fraction of about 20%, chronic renal failure stage III. He is listed as a type II diabetic in epic although the patient denies this. He also has a history of PVD. He states for the last month he has had wounds on his bilateral lower extremities that started off as blisters which denuded. He has areas on the left lateral calf and 2 on the right lateral. He has an area on the left first met head which he did not know was there he we identified this on intake. He has been using Silvadene cream provided by his primary care physician but he is complaining that this burns. 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 He was acutely ill at that time complicating GI bleed. ABIs in our clinic were 1.16 on the right and 1.13 on the left 2/25; the patient comes in with his areas on the left lateral and right lateral calf. There is also an area over the left first MTP bunion deformity. We have been using Sorbact. His edema control is fairly good 3/4; left lateral and right lateral calf. Most of his wounds are in the same position tightly adherent nonviable debris. On the right we  debrided the superior wound on the left  both wounds. The area on his bunion over the left first MTP medially is I think just about closed. We have been using Sorbact without a lot of success changed to Iodoflex under compression 3/11; left lateral and right lateral calf perhaps minor improvement in the surface condition. We have been using Iodoflex. The area over the bunion of the left first MTP has closed over 3/18; left lateral and right lateral calf not much improvement. We have been using Iodoflex. Aggressive debridement last week. 3/25; left lateral and right lateral calf. We have been using Iodoflex. There is some improvement in the superior area on the right and 2 on the lateral left although there is still a lot of debris on the surface. The inferior area on the right is still a completely nonviable surface. 4/8; left lateral and right lateral calfs. Essentially mirror-image looking wounds 2 wounds on each side in close juxtaposition we have been using Iodoflex with some improvement in the very adherent fibrinous debris but not a lot. The patient has arterial studies next Wednesday morning and venous studies next Thursday morning. I have been avoiding any further aggressive debridement until we see the arterial study results. His ABIs were fairly good in this clinic and is dorsalis pedis pulses are palpable but the wound beds are pale. 4/15; left lateral and right lateral calfs. Essentially mirror-image wounds with 2 wounds on each side in close juxtaposition but with rims of separating normal tissue. We have been using Iodoflex. The base of the wounds has been cleaning up quite nicely ARTERIAL STUDIES were done showing the patient had an ABI on the right of 1.27 with triphasic waveforms and a TBI of 0.90 on the left triphasic waveforms with an ABI of 1.28 and a TBI of 0.87. No evidence of arterial disease VENOUS REFLUX STUDIES; were done yesterday we do not have this report  yet. 4/23; VENOUS REFLUX STUDIES did not show any significant reflux right lower extremity. No evidence of a DVT He did have significant reflux in the common . femoral vein on the left but nothing else was listed as significant. He did not have a DVT. We have been using Iodoflex under compression his wounds are making progress. 4/29; improvements in the wound surface continue. We have been using Iodoflex. He has a 20% out-of-pocket co-pay for Apligraf which is unfortunate. We changed him to Sorbact today 5/6; started on Sorbact last week. Better looking wound surface but not much change in dimensions. 01/24/20-Patient is back at 3 weeks, wound surfaces are about the same but very minimal slough on the right leg wounds, however there is blistering on both legs adjacent to the wounds, patient denies any other symptoms or new symptoms. His studies have been reviewed and do not indicate any significant arterial or venous disease. But he is attending once in 3 weeks with home health changing in between 6/3; patient has 4 wounds 2 on each side of his lateral lower legs. These wounds are somewhat improved. He apparently arrived in clinic last week with a large blister on the right lateral lower leg that had denuded into the wound. They changed to silver alginate. Still under compression. He has straight Medicare and unfortunately has an unlimited co-pay for advanced treatment options. 6/10; his original 4 wounds are all better especially on the left lateral lower leg. Areas on the right medial have a better looking surface were using silver collagen The blister on the right lateral lower leg from last week has an  open area however this looks fairly healthy were using silver alginate on this He comes in today having "stubbed" his left second toe he has an abrasion at the base of the nailbed I think he probably caught the nail which is mycotic. 6/24; blister on the right lateral lower leg has healed. There  is no additional wounds. The traumatic left second toe is also closed. The 4 that he has are all better 7/1; all of the patient's wounds are smaller except for the proximal one on the left lateral calf. We are using silver collagen 7/22; patient has home health. Relatively refractory wounds on the lateral part of both mid calfs. Each of them 2 wounds. The areas on the left are doing much better in fact the distal 1 looks like it is on his way to closure. The right required debridement with adherent fibrinous debris under illumination. We have been using silver collagen. Previous arterial studies were within normal limits he also had venous reflux studies 04/03/2020 upon evaluation today patient appears to be doing excellent with regard to his lower extremity ulcers. He is going to require a little bit of debridement on the wounds on the right especially in order to clear away some biofilm and slough but this is minimal and overall he seems to be doing quite well. 8/19; the patient's wounds on the left lateral lower leg just above closed. However the 2 on the lateral part of the right have a nonviable necrotic surface. This is disappointing. There is also no change in dimensions. He has had arterial studies as well as venous reflux studies 8/26; very disappointing today. Even the more superficial areas on the left are about the same as last week. Still 2 punched-out areas completely unchanged on the right. We have been using silver collagen really making no progress. 9/2; changed to Iodoflex last week better looking wound surfaces especially on the right although they are deep and punched out. The areas on the left are more superficial open 1 of these is almost fully epithelialized although it has been this way for at least 2 weeks. He has a 20% co-pay [Medicare only] unaffordable for a skin substitute. Had some thought about a snap VAC on the deep areas on the right leg we will try to put this through in  the insurance 9/9; punched-out wounds on the bilateral lateral lower extremities. We have been operating as if the these were chronic venous wounds with secondary lymphedema. The areas on the left have been doing well in fact one of them is closed over. We have had no improvement in the areas on the right we have been using Iodoflex to help with surface debridement. The areas on the right are deeper wider. I do not see any evidence of infection. Home health is not been putting the wraps on high enough he has localized lymphedema right above the wounds. He has had arterial and venous studies already 9/16; punched-out mirror-image wounds on his bilateral lateral mid calfs. We have 1 closed on the left lateral the other smaller. For the first time today the areas on the right lateral actually looks some better. I did a biopsy of 1 of these last time although we still do not have this result. HOWEVER he arrives in clinic today complaining of chest pain overnight which felt like a brick on his chest. He also had 2 black watery bowel movements. He does not have nausea or vomiting. He is not describing abdominal pain. He  has no prior history of diarrhea heartburn etc. 9/30; sent this patient to the ER on 9/16. 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 He is on twice daily PPIs Protonix. His admission hemoglobin was exceptionally low at 5. 8.. Discharged at 8.1. 4 unit transfusion. 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 His diuretic dose was actually decreased because of elevated creatinine currently at torsemide 20 mg not felt to be a Coumadin candidate. Patient states he feels well. The areas on his left leg are healed. The areas on the right look better. These have been very refractory wounds. He does not have stockings we will make arrangements for this today 10/7; the areas on the left leg are healed  we will transition him into his 20/30 stocking today. Continued improvement in the areas on the right lateral leg 10/21; the areas on the left leg remain healed at this 2-week follow-up however there is a history that he developed some drainage from one of the wound sites with home health therefore going ahead and wrapping him. He was supposed to be on 20/30 stockings although the history here is vague and I am not sure that this represents a stocking failure or not. We have been wrapping his right leg with the one remaining wound is the distal wound. We have been using silver collagen 10/28 the patient only has 1 wound remaining on the right lower calf. This still has 3 mm of depth which is unchanged but the surface area is smaller. The superior wound on the right lateral is closed. The 2 wounds on the left remain closed and he is using his compression stocking 11/4 1 remaining wound on the right lower calf. Under illumination not a viable surface we have been using silver collagen we had good effect on the other areas but this 1 appears to be stalled. There is no open area on the left leg he is using his own stocking Electronic Signature(s) Signed: 07/03/2020 5:37:49 PM By: Linton Ham MD Entered By: Linton Ham on 07/03/2020 08:39:25 -------------------------------------------------------------------------------- Physical Exam Details Patient Name: Date of Service: [place], [place] 07/03/2020 7:30 [place] Record Number: [id number] Patient Account Number: [id number] Date of Birth/Sex: Treating RN: 1941-06-11 (79 y.o. [name], [name] Primary Care Provider: [name]., [name] Other Clinician: Referring Provider: Treating Provider/Extender: [name], [name] in Treatment: 66 Constitutional Sitting or standing Blood Pressure is within target range for patient.. Pulse regular and within target range for patient.Marland Kitchen Respirations regular, non-labored  and within target range.. Temperature is normal and within the target range for the patient.Marland Kitchen Appears in no distress. Notes Wound exam; the area on the left is still healed per the patient although at one point I would like to take the stocking off and make sure. The wound on the right does not have a viable surface. I have changed back to Iodoflex. Debrided with a #3 curette very adherent gritty debris on the surface hemostasis with direct pressure. There is no evidence of infection or ischemia. His edema is well controlled Electronic Signature(s) Signed: 07/03/2020 5:37:49 PM By: Linton Ham MD Entered By: Linton Ham on 07/03/2020 08:40:24 -------------------------------------------------------------------------------- Physician Orders Details Patient Name: Date of Service: [place], [place] 07/03/2020 7:30 [place] Record Number: [id number] Patient Account Number: [id number] Date of Birth/Sex: Treating RN: 1940/12/17 (  79 y.o. 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 Dressing Change Frequency Other: - all wounds - change twice a week by home health Skin Barriers/Peri-Wound Care Moisturizing lotion - patient to lotion left leg daily. lotion right leg with dressing changes. Wound  Cleansing May shower with protection. - use cast protectors on the days dressings are not changed. May shower and wash wound with soap and water. - with dressing changes only. Primary Wound Dressing Wound #4 Right,Distal,Lateral Lower Leg Iodoflex Secondary Dressing Dry Gauze - all wounds Edema Control 4 layer compression - Right Lower Extremity - Please ensure to wrap just above the toes and just the below the knee. Avoid standing for long periods of time Elevate legs to the level of the heart or above for 30 minutes daily and/or when sitting, a frequency of: - throughout the day. Exercise regularly Support Garment 20-30 mm/Hg pressure to: - patient to wear compression stocking to left leg. Apply in the morning and remove at night. [place] [facility] skilled nursing for wound care. [facility] Electronic Signature(s) Signed: 07/03/2020 5:37:49 PM By: Linton Ham MD Signed: 07/03/2020 6:08:00 PM By: Deon Pilling Entered By: Deon Pilling on 07/03/2020 08:15:05 -------------------------------------------------------------------------------- Problem List Details Patient Name: Date of Service: [place] 07/03/2020 7:30 [place] Record Number: [id number] Patient Account Number: [id number] Date of Birth/Sex: Treating RN: 1941/03/26 (79 y.o. 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/06/2020 No Yes L97.812 Non-pressure chronic ulcer of other part of right lower leg  with fat layer 01/03/2020 No Yes exposed S90.812D Abrasion, left foot, subsequent encounter 02/07/2020 No Yes I42.9 Cardiomyopathy, unspecified 05/15/2020 No Yes K92.1 [place]ena 05/15/2020 No Yes Inactive Problems ICD-10 Code Description Active Date Inactive Date L97.521 Non-pressure chronic ulcer of other part of left foot limited to breakdown of skin 10/11/2019 10/11/2019 Resolved Problems ICD-10 Code Description Active Date Resolved Date 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 10/11/2019 10/11/2019 Electronic Signature(s) Signed: 07/03/2020 5:37:49 PM By: Linton Ham MD Entered By: Linton Ham on 07/03/2020 08:37:56 -------------------------------------------------------------------------------- Progress Note Details Patient Name: Date of Service: [place] 07/03/2020 7:30 [place] Record Number: [id number] Patient Account Number: [id number] Date of Birth/Sex: Treating RN: 27-Nov-1940 (79 y.o. [name], [name] Primary Care Provider: [name]., [name] Other Clinician: Referring Provider: Treating Provider/Extender: [name], [name] in Treatment: 41 Subjective History of Present Illness (HPI) ADMISSION 10/11/2019 This is a 79 year old man with a history of  a severe cardiomyopathy with an ejection fraction of about 20%, chronic renal failure stage III. He is listed as a type II diabetic in epic although the patient denies this. He also has a history of PVD. He states for the last month he has had wounds on his bilateral lower extremities that started off as blisters which denuded. He has areas on the left lateral calf and 2 on the right lateral. He has an area on the left first met head which he did not know was there he we identified this on intake. He has been using Silvadene cream provided by his primary care physician but he is complaining that this burns. Past medical history; acute on chronic congestive  heart failure with a severe cardiomyopathy, history of hypoalbuminemia with an albumin of 1.9 in November, on chronic Coumadin at this point for reasons that are not totally clear, listed as a type II diabetic although the patient denies this, chronic kidney disease stage III, cholangitis with an acute hospital admission from 10/19 through 07/08/2019. He was acutely ill at that time complicating GI bleed. ABIs in our clinic were 1.16 on the right and 1.13 on the left 2/25; the patient comes in with his areas on the left lateral and right lateral calf. There is also an area over the left first MTP bunion deformity. We have been using Sorbact. His edema control is fairly good 3/4; left lateral and right lateral calf. Most of his wounds are in the same position tightly adherent nonviable debris. On the right we debrided the superior wound on the left both wounds. The area on his bunion over the left first MTP medially is I think just about closed. We have been using Sorbact without a lot of success changed to Iodoflex under compression 3/11; left lateral and right lateral calf perhaps minor improvement in the surface condition. We have been using Iodoflex. The area over the bunion of the left first MTP has closed over 3/18; left lateral and right lateral calf not much improvement. We have been using Iodoflex. Aggressive debridement last week. 3/25; left lateral and right lateral calf. We have been using Iodoflex. There is some improvement in the superior area on the right and 2 on the lateral left although there is still a lot of debris on the surface. The inferior area on the right is still a completely nonviable surface. 4/8; left lateral and right lateral calfs. Essentially mirror-image looking wounds 2 wounds on each side in close juxtaposition we have been using Iodoflex with some improvement in the very adherent fibrinous debris but not a lot. The patient has arterial studies next Wednesday morning  and venous studies next Thursday morning. I have been avoiding any further aggressive debridement until we see the arterial study results. His ABIs were fairly good in this clinic and is dorsalis pedis pulses are palpable but the wound beds are pale. 4/15; left lateral and right lateral calfs. Essentially mirror-image wounds with 2 wounds on each side in close juxtaposition but with rims of separating normal tissue. We have been using Iodoflex. The base of the wounds has been cleaning up quite nicely ARTERIAL STUDIES were done showing the patient had an ABI on the right of 1.27 with triphasic waveforms and a TBI of 0.90 on the left triphasic waveforms with an ABI of 1.28 and a TBI of 0.87. No evidence of arterial disease VENOUS REFLUX STUDIES; were done yesterday we do not have this report yet. 4/23; VENOUS REFLUX STUDIES did  not show any significant reflux right lower extremity. No evidence of a DVT He did have significant reflux in the common . femoral vein on the left but nothing else was listed as significant. He did not have a DVT. We have been using Iodoflex under compression his wounds are making progress. 4/29; improvements in the wound surface continue. We have been using Iodoflex. He has a 20% out-of-pocket co-pay for Apligraf which is unfortunate. We changed him to Sorbact today 5/6; started on Sorbact last week. Better looking wound surface but not much change in dimensions. 01/24/20-Patient is back at 3 weeks, wound surfaces are about the same but very minimal slough on the right leg wounds, however there is blistering on both legs adjacent to the wounds, patient denies any other symptoms or new symptoms. His studies have been reviewed and do not indicate any significant arterial or venous disease. But he is attending once in 3 weeks with home health changing in between 6/3; patient has 4 wounds 2 on each side of his lateral lower legs. These wounds are somewhat improved. He apparently  arrived in clinic last week with a large blister on the right lateral lower leg that had denuded into the wound. They changed to silver alginate. Still under compression. He has straight Medicare and unfortunately has an unlimited co-pay for advanced treatment options. 6/10; his original 4 wounds are all better especially on the left lateral lower leg. 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 6/24; blister on the right lateral lower leg has healed. There is no additional wounds. The traumatic left second toe is also closed. The 4 that he has are all better 7/1; all of the patient's wounds are smaller except for the proximal one on the left lateral calf. We are using silver collagen 7/22; patient has home health. Relatively refractory wounds on the lateral part of both mid calfs. Each of them 2 wounds. The areas on the left are doing much better in fact the distal 1 looks like it is on his way to closure. The right required debridement with adherent fibrinous debris under illumination. We have been using silver collagen. Previous arterial studies were within normal limits he also had venous reflux studies 04/03/2020 upon evaluation today patient appears to be doing excellent with regard to his lower extremity ulcers. He is going to require a little bit of debridement on the wounds on the right especially in order to clear away some biofilm and slough but this is minimal and overall he seems to be doing quite well. 8/19; the patient's wounds on the left lateral lower leg just above closed. However the 2 on the lateral part of the right have a nonviable necrotic  surface. This is disappointing. There is also no change in dimensions. He has had arterial studies as well as venous reflux studies 8/26; very disappointing today. Even the more superficial areas on the left are about the same as last week. Still 2 punched-out areas completely unchanged on the right. We have been using silver collagen really making no progress. 9/2; changed to Iodoflex last week better looking wound surfaces especially on the  right although they are deep and punched out. The areas on the left are more superficial open 1 of these is almost fully epithelialized although it has been this way for at least 2 weeks. He has a 20% co-pay [Medicare only] unaffordable for a skin substitute. Had some thought about a snap VAC on the deep areas on the right leg we will try to put this through in the insurance 9/9; punched-out wounds on the bilateral lateral lower extremities. We have been operating as if the these were chronic venous wounds with secondary lymphedema. The areas on the left have been doing well in fact one of them is closed over. We have had no improvement in the areas on the right we have been using Iodoflex to help with surface debridement. The areas on the right are deeper wider. I do not see any evidence of infection. Home health is not been putting the wraps on high enough he has localized lymphedema right above the wounds. He has had arterial and venous studies already 9/16; punched-out mirror-image wounds on his bilateral lateral mid calfs. We have 1 closed on the left lateral the other smaller. For the first time today the areas on the right lateral actually looks some better. I did a biopsy of 1 of these last time although we still do not have this result. HOWEVER he arrives in clinic today complaining of chest pain overnight which felt like a brick on his chest. He also had 2 black watery bowel movements. He does not have nausea or vomiting. He is not describing abdominal  pain. He has no prior history of diarrhea heartburn etc. 9/30; sent this patient to the ER on 9/16. 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 He is on twice daily PPIs Protonix. His admission hemoglobin was exceptionally low at 5. 8.. Discharged at 8.1. 4 unit transfusion. 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 His diuretic dose was actually decreased because of elevated creatinine currently at torsemide 20 mg not felt to be a Coumadin candidate. Patient states he feels well. The areas on his left leg are healed. The areas on the right look better. These have been very refractory wounds. He does not have stockings we will make arrangements for this today 10/7; the areas on the left leg are healed we will transition him into his 20/30 stocking today. Continued improvement in the areas on the right lateral leg 10/21; the areas on the left leg remain healed at this 2-week follow-up however there is a history that he developed some drainage from one of the wound sites with home health therefore going ahead and wrapping him. He was supposed to be on 20/30 stockings although the history here is vague and I am not sure that this represents a stocking failure or not. We have been wrapping his right leg with the one remaining wound is the distal wound. We have been using silver collagen 10/28 the patient only has 1 wound remaining on the right lower calf. This still has 3 mm of depth which is unchanged but the surface area is smaller. The superior wound on the right lateral is closed. The 2 wounds on the left remain closed and he is using his compression stocking 11/4 1 remaining wound on the right lower calf. Under illumination not a viable surface we have been using silver collagen we had good effect on the  other areas but this 1 appears to be stalled. There is no open area on the left leg he is using  his own stocking Objective Constitutional Sitting or standing Blood Pressure is within target range for patient.. Pulse regular and within target range for patient.Marland Kitchen Respirations regular, non-labored and within target range.. Temperature is normal and within the target range for the patient.Marland Kitchen Appears in no distress. Vitals Time Taken: 7:42 AM, Height: 71 in, Weight: 198 lbs, BMI: 27.6, Temperature: 97.5 F, Pulse: 91 bpm, Respiratory Rate: 18 breaths/min, Blood Pressure: 119/78 mmHg. General Notes: Wound exam; the area on the left is still healed per the patient although at one point I would like to take the stocking off and make sure. The wound on the right does not have a viable surface. I have changed back to Iodoflex. Debrided with a #3 curette very adherent gritty debris on the surface hemostasis with direct pressure. There is no evidence of infection or ischemia. His edema is well controlled Integumentary (Hair, Skin) Wound #4 status is Open. Original cause of wound was Blister. The wound is located on the Right,Distal,Lateral Lower Leg. The wound measures 1.5cm length x 1cm width x 0.2cm depth; 1.178cm^2 area and 0.236cm^3 volume. There is Fat Layer (Subcutaneous Tissue) exposed. There is no tunneling or undermining noted. There is a medium amount of serosanguineous drainage noted. The wound margin is well defined and not attached to the wound base. There is large (67- 100%) pink, pale granulation within the wound bed. There is a small (1-33%) amount of necrotic tissue within the wound bed including Adherent Slough. Assessment Active Problems ICD-10 Chronic venous hypertension (idiopathic) with inflammation of bilateral lower extremity Lymphedema, not elsewhere classified Non-pressure chronic ulcer of other part of left lower leg with fat layer exposed Non-pressure chronic ulcer of other part of right lower leg with fat layer exposed Abrasion, left foot, subsequent  encounter Cardiomyopathy, unspecified [facility] Procedures Wound #4 Pre-procedure diagnosis of Wound #4 is a Venous Leg Ulcer located on the Right,Distal,Lateral Lower Leg .Severity of Tissue Pre Debridement is: Fat layer exposed. There was a Excisional Skin/Subcutaneous Tissue Debridement with a total area of 1.5 sq cm performed by Ricard Dillon., MD. With the following instrument(s): Curette to remove Viable and Non-Viable tissue/material. Material removed includes Subcutaneous Tissue, Slough, Skin: Dermis, and Fibrin/Exudate after achieving pain control using Lidocaine 4% Topical Solution. A time out was conducted at 08:10, prior to the start of the procedure. A Minimum amount of bleeding was controlled with Pressure. The procedure was tolerated well with a pain level of 0 throughout and a pain level of 0 following the procedure. Post Debridement Measurements: 1.5cm length x 1cm width x 0.2cm depth; 0.236cm^3 volume. Character of Wound/Ulcer Post Debridement is improved. Severity of Tissue Post Debridement is: Fat layer exposed. 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 There was a Four Layer Compression Therapy Procedure with a pre-treatment ABI of 1.2 by Levan Hurst, RN. Post procedure Diagnosis Wound #4: Same as Pre-Procedure Plan Follow-up Appointments: Return Appointment in 1 week. Dressing Change Frequency: Other: - all wounds - change twice a week by home health Skin Barriers/Peri-Wound Care: Moisturizing lotion - patient to lotion left leg daily. lotion right leg with dressing changes. Wound Cleansing: May shower with protection. - use cast protectors on the days dressings are not changed. May shower and wash wound with soap and water. -  with dressing changes only. Primary Wound Dressing: Wound #4 Right,Distal,Lateral Lower Leg: Iodoflex Secondary  Dressing: Dry Gauze - all wounds Edema Control: 4 layer compression - Right Lower Extremity - Please ensure to wrap just above the toes and just the below the knee. Avoid standing for long periods of time Elevate legs to the level of the heart or above for 30 minutes daily and/or when sitting, a frequency of: - throughout the day. Exercise regularly Support Garment 20-30 mm/Hg pressure to: - patient to wear compression stocking to left leg. Apply in the morning and remove at night. Home Health: [facility] for wound care. - [facility] 1. I change the primary dressing to Iodoflex still under compression. 2. I had like to continue to follow this on a weekly basis Electronic Signature(s) Signed: 07/03/2020 5:37:49 PM By: Linton Ham MD Entered By: Linton Ham on 07/03/2020 08:40:55 -------------------------------------------------------------------------------- SuperBill Details Patient Name: Date of Service: [place] 07/03/2020 Medical Record Number: [id number] Patient Account Number: [id number] Date of Birth/Sex: Treating RN: 11/10/1940 (79 y.o. [name], [name] Primary Care Provider: [name]., [name] Other Clinician: Referring Provider: Treating Provider/Extender: [name], [name] in Treatment: 38 Diagnosis Coding ICD-10 Codes Code Description [id number] Chronic venous hypertension (idiopathic) with inflammation of bilateral lower extremity I89.0 Lymphedema, not elsewhere classified L97.822 Non-pressure chronic ulcer of other part of left lower leg with fat layer exposed L97.812 Non-pressure chronic ulcer of other part of right lower leg with fat layer exposed S90.812D Abrasion, left foot, subsequent encounter I42.9 Cardiomyopathy, unspecified K92.1 [facility] Facility Procedures The patient participates with Medicare or their insurance follows the Medicare Facility Guidelines: CPT4 Code Description  Modifier Quantity [id number] 11042 - DEB SUBQ TISSUE 20 SQ CM/< 1 ICD-10 Diagnosis Description G89.169 Non-pressure chronic ulcer of  other part of right lower leg with fat layer exposed Physician Procedures : CPT4 Code Description Modifier [id number] - WC PHYS LEVEL 3 - EST PT ICD-10 Diagnosis Description L97.812 Non-pressure chronic ulcer of other part of right lower leg with fat layer exposed Quantity: 1 : [id number] - WC PHYS SUBQ TISS 20 SQ CM ICD-10 Diagnosis Description A56.979 Non-pressure chronic ulcer of other part of right lower leg with fat layer exposed Quantity: 1 Electronic Signature(s) Signed: 07/03/2020 5:37:49 PM By: Linton Ham MD Entered By: Linton Ham on 07/03/2020 08:41:20

## 2020-07-04 NOTE — Progress Notes (Signed)
[name], [name] ([id number]) Visit Report for 07/03/2020 Arrival Information Details Patient Name: Date of Service: [name], [name] 07/03/2020 7:30 [place] Record Number: [id number] Patient Account Number: [id number] Date of Birth/Sex: Treating RN: 25-May-1941 (79 y.o. [name] Laurissa Cowper: [name]., [name] Other Clinician: Referring Axil Copeman: Treating Bryceton Hantz/Extender: [name], [name] in Treatment: 12 Visit Information History Since Last Visit Added or deleted any medications: No Patient Arrived: Ambulatory Any new allergies or adverse reactions: No Arrival Time: 07:39 Had a fall or experienced change in No Accompanied By: self activities of daily living that may affect Transfer Assistance: None risk of falls: Patient Identification Verified: Yes Signs or symptoms of abuse/neglect since last visito No Secondary Verification Process Completed: Yes Hospitalized since last visit: No Patient Requires Transmission-Based Precautions: No Implantable device outside of the clinic excluding No Patient Has Alerts: Yes cellular tissue based products placed in the Patton Patient Alerts: Patient on Blood Thinner since last visit: Has Dressing in Place as Prescribed: Yes Pain Present Now: No Electronic Signature(s) Signed: 07/03/2020 9:07:27 AM By: Sandre Kitty Entered By: Sandre Kitty on 07/03/2020 07:42:42 -------------------------------------------------------------------------------- Compression Therapy Details Patient Name: Date of Service: [name] 07/03/2020 7:30 [place] Record Number: [id number] Patient Account Number: [id number] Date of Birth/Sex: Treating RN: 11-19-1940 (79 y.o. [name] Samia Kukla: [name]., [name] Other Clinician: Referring Jasleen Riepe: Treating Natesha Hassey/Extender: [name], [name] in Treatment: 38 Compression Therapy Performed for  Wound Assessment: Wound #4 Right,Distal,Lateral Lower Leg Performed By: Clinician Levan Hurst, RN Compression Type: Four Layer Pre Treatment ABI: 1.2 Post Procedure Diagnosis Same as Pre-procedure Electronic Signature(s) Signed: 07/03/2020 6:08:00 PM By: Deon Pilling Entered By: Deon Pilling on 07/03/2020 08:14:09 -------------------------------------------------------------------------------- Encounter Discharge Information Details Patient Name: Date of Service: [name] 07/03/2020 7:30 [place] Record Number: [id number] Patient Account Number: [id number] Date of Birth/Sex: Treating RN: August 03, 1941 (79 y.o. [name] Geno Sydnor: [name]., [name] Other Clinician: Referring Lyall Faciane: Treating Ilai Hiller/Extender: [name], [name] in Treatment: 76 Encounter Discharge Information Items Post Procedure Vitals Discharge Condition: Stable Temperature (F): 97.5 Ambulatory Status: Ambulatory Pulse (bpm): 91 Discharge Destination: Home Respiratory Rate (breaths/min): 18 Transportation: Private Auto Blood Pressure (mmHg): 119/78 Accompanied By: alone Schedule Follow-up Appointment: Yes Clinical Summary of Care: Patient Declined Electronic Signature(s) Signed: 07/03/2020 5:54:11 PM By: Levan Hurst RN, BSN Entered By: Levan Hurst on 07/03/2020 08:31:50 -------------------------------------------------------------------------------- Lower Extremity Assessment Details Patient Name: Date of Service: [name], [name] 07/03/2020 7:30 [place] Record Number: [id number] Patient Account Number: [id number] Date of Birth/Sex: Treating RN: 29-Sep-1940 (79 y.o. [name]) [name] Primary Care Jacilyn Sanpedro: [name]., [name] Other Clinician: Referring Christoher Drudge: Treating [place] Mosco/Extender: [name], [name] in Treatment: 38 Edema Assessment Assessed: [Left: No] [Right: No] Edema: [Left: Yes]  [Right: Yes] Calf Left: Right: Point of Measurement: 53 cm From Medial Instep 38 cm 34 cm Ankle Left: Right: Point of Measurement: 14 cm From Medial Instep 22 cm 22 cm Electronic Signature(s) Signed: 07/04/2020 5:00:24 PM By: [name] RN Entered By: [name] on 07/03/2020 07:52:11 -------------------------------------------------------------------------------- Multi Wound Chart Details Patient Name: Date of Service: [name] 07/03/2020 7:30 [place] Record Number: [id number] Patient Account Number: [id number] Date of Birth/Sex: Treating RN: [birth year] (79  y.o. [name], [name] Primary Care Syncere Kaminski: [name]., [name] Other Clinician: Referring Mohogany Toppins: Treating Laureen Frederic/Extender: [name], [name] in Treatment: 93 Vital Signs Height(in): 71 Pulse(bpm): 91 Weight(lbs): 198 Blood Pressure(mmHg): 119/78 Body Mass Index(BMI): 28 Temperature(F): 97.5 Respiratory Rate(breaths/min): 18 Photos: [4:No Photos Right, Distal, Lateral Lower Leg] [N/A:N/A N/A] Wound Location: [4:Blister] [N/A:N/A] Wounding Event: [4:Venous Leg Ulcer] [N/A:N/A] Primary Etiology: [4:Anemia, Congestive Heart Failure,] [N/A:N/A] Comorbid History: [4:Hypertension, Peripheral Venous Disease, 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] [N/A:N/A] Date Acquired: [4:38] [N/A:N/A] Weeks of Treatment: [4:Open] [N/A:N/A] Wound Status: [4:1.5x1x0.2] [N/A:N/A] Measurements L x W x D (cm) [4:1.178] [N/A:N/A] A (cm) : rea [4:0.236] [N/A:N/A] Volume (cm) : [4:87.30%] [N/A:N/A] % Reduction in A [4:rea: 74.60%] [N/A:N/A] % Reduction in Volume: [4:Full Thickness Without Exposed] [N/A:N/A] Classification: [4:Support Structures Medium] [N/A:N/A] Exudate A mount: [4:Serosanguineous] [N/A:N/A] Exudate Type: [4:red, brown] [N/A:N/A] Exudate Color: [4:Well defined, not attached] [N/A:N/A] Wound Margin: [4:Large (67-100%)] [N/A:N/A] Granulation A mount: [4:Pink, Pale]  [N/A:N/A] Granulation Quality: [4:Small (1-33%)] [N/A:N/A] Necrotic A mount: [4:Fat Layer (Subcutaneous Tissue): Yes N/A] Exposed Structures: [4:Fascia: No Tendon: No Muscle: No Joint: No Bone: No Small (1-33%)] [N/A:N/A] Epithelialization: [4:Debridement - Excisional] [N/A:N/A] Debridement: Pre-procedure Verification/Time Out 08:10 [N/A:N/A] Taken: [4:Lidocaine 4% Topical Solution] [N/A:N/A] Pain Control: [4:Subcutaneous, Slough] [N/A:N/A] Tissue Debrided: [4:Skin/Subcutaneous Tissue] [N/A:N/A] Level: [4:1.5] [N/A:N/A] Debridement A (sq cm): [4:rea Curette] [N/A:N/A] Instrument: [4:Minimum] [N/A:N/A] Bleeding: [4:Pressure] [N/A:N/A] Hemostasis A chieved: [4:0] [N/A:N/A] Procedural Pain: [4:0] [N/A:N/A] Post Procedural Pain: [4:Procedure was tolerated well] [N/A:N/A] Debridement Treatment Response: [4:1.5x1x0.2] [N/A:N/A] Post Debridement Measurements L x W x D (cm) [4:0.236] [N/A:N/A] Post Debridement Volume: (cm) [4:Compression Therapy] [N/A:N/A] Procedures Performed: [4:Debridement] Treatment Notes Wound #4 (Right, Distal, Lateral Lower Leg) 1. Cleanse With Soap and water 2. Periwound Care Moisturizing lotion 3. Primary Dressing Applied Iodoflex 4. Secondary Dressing Dry Gauze 6. Support Layer Applied 4 layer compression Water quality scientist) Signed: 07/03/2020 5:37:49 PM By: Linton Ham MD Signed: 07/03/2020 6:08:00 PM By: Deon Pilling Entered By: Linton Ham on 07/03/2020 08:38:10 -------------------------------------------------------------------------------- Multi-Disciplinary Care Plan Details Patient Name: Date of Service: [name] 07/03/2020 7:30 [place] Record Number: [id number] Patient Account Number: [id number] Date of Birth/Sex: Treating RN: 06-Sep-1940 (79 y.o. [name], [name] Primary Care Sondos Wolfman: [name]., [name] Other Clinician: Referring Oluwatobi Ruppe: Treating Ellionna Buckbee/Extender: [name], RO  [name] in Treatment: 38 Active Inactive Venous Leg Ulcer Nursing Diagnoses: Knowledge deficit related to disease process and management Potential for venous Insuffiency (use before diagnosis confirmed) Goals: Patient will maintain optimal edema control Date Initiated: 05/01/2020 Target Resolution Date: 08/01/2020 Goal Status: Active Interventions: Assess peripheral edema status every visit. Compression as ordered Provide education on venous insufficiency Notes: Electronic Signature(s) Signed: 07/03/2020 6:08:00 PM By: Deon Pilling Entered By: Deon Pilling on 07/03/2020 07:51:28 -------------------------------------------------------------------------------- Pain Assessment Details Patient Name: Date of Service: [name] 07/03/2020 7:30 [place] Record Number: [id number] Patient Account Number: [id number] Date of Birth/Sex: Treating RN: [birth year] (79 y.o. [name] Valinda Fedie: [name]., [name] Other Clinician: Referring Arthur Speagle: Treating [place]/Extender: [name], [name] in Treatment: 66 Active Problems Location of Pain Severity and Description of Pain Patient Has Paino No Site Locations Pain Management and Medication Current Pain Management: Electronic Signature(s) Signed: 07/03/2020 9:07:27 AM By: Sandre Kitty Signed: 07/03/2020 6:08:00 PM By: Deon Pilling Entered By: Sandre Kitty on 07/03/2020 07:43:33 -------------------------------------------------------------------------------- Patient/Caregiver Education Details Patient Name:  Date of Service: Troy RRA [place] 11/4/2021andnbsp7:30 A M Medical Record Number: [id number] Patient Account Number: [id number] Date of Birth/Gender: Treating RN: 17-Dec-1940 (79 y.o. [name] Physician: [name]., [name] Other Clinician: Referring Physician: Treating Physician/Extender: [name], [name]  in Treatment: 60 Education Assessment Education Provided To: Patient Education Topics Provided Venous: Handouts: Managing Venous Disease and Related Ulcers Methods: Explain/Verbal Responses: Reinforcements needed Electronic Signature(s) Signed: 07/03/2020 6:08:00 PM By: Deon Pilling Entered By: Deon Pilling on 07/03/2020 07:51:37 -------------------------------------------------------------------------------- Wound Assessment Details Patient Name: Date of Service: [name] 07/03/2020 7:30 [place] Record Number: [id number] Patient Account Number: [id number] Date of Birth/Sex: Treating RN: 05-08-1941 (79 y.o. [name], [name] Primary Care Roseanna Koplin: [name]., [name] Other Clinician: Referring Reighlyn Elmes: Treating Kier Smead/Extender: [name], [name] in Treatment: 38 Wound Status Wound Number: 4 Primary Venous Leg Ulcer Etiology: Wound Location: Right, Distal, Lateral Lower Leg Wound Open Wounding Event: Blister Status: Date Acquired: 09/11/2019 Comorbid Anemia, Congestive Heart Failure, Hypertension, Peripheral Weeks Of Treatment: 38 History: Venous Disease, End Stage Renal Disease Clustered Wound: No Wound Measurements Length: (cm) 1.5 Width: (cm) 1 Depth: (cm) 0.2 Area: (cm) 1.178 Volume: (cm) 0.236 % Reduction in Area: 87.3% % Reduction in Volume: 74.6% Epithelialization: Small (1-33%) Tunneling: No Undermining: No Wound Description Classification: Full Thickness Without Exposed Support Structures Wound Margin: Well defined, not attached Exudate Amount: Medium Exudate Type: Serosanguineous Exudate Color: red, brown Foul Odor After Cleansing: No Slough/Fibrino Yes Wound Bed Granulation Amount: Large (67-100%) Exposed Structure Granulation Quality: Pink, Pale Fascia Exposed: No Necrotic Amount: Small (1-33%) Fat Layer (Subcutaneous Tissue) Exposed: Yes Necrotic Quality: Adherent Slough Tendon Exposed: No Muscle  Exposed: No Joint Exposed: No Bone Exposed: No Treatment Notes Wound #4 (Right, Distal, Lateral Lower Leg) 1. Cleanse With Soap and water 2. Periwound Care Moisturizing lotion 3. Primary Dressing Applied Iodoflex 4. Secondary Dressing Dry Gauze 6. Support Layer Applied 4 layer compression wrap Electronic Signature(s) Signed: 07/03/2020 6:08:00 PM By: Deon Pilling Signed: 07/04/2020 5:00:24 PM By: [name] RN Entered By: [name] on 07/03/2020 07:52:37 -------------------------------------------------------------------------------- Vitals Details Patient Name: Date of Service: Troy [place] 07/03/2020 7:30 [name] Record Number: [id number] Patient Account Number: [id number] Date of Birth/Sex: Treating RN: [birth year] (79 y.o. [name], [name] Primary Care Citlalli Weikel: [name]., [name] Other Clinician: Referring Jadore Mcguffin: Treating Nitika Jackowski/Extender: [name], [name] in Treatment: 38 Vital Signs Time Taken: 07:42 Temperature (F): 97.5 Height (in): 71 Pulse (bpm): 91 Weight (lbs): 198 Respiratory Rate (breaths/min): 18 Body Mass Index (BMI): 27.6 Blood Pressure (mmHg): 119/78 Reference Range: 80 - 120 mg / dl Electronic Signature(s) Signed: 07/03/2020 9:07:27 AM By: Sandre Kitty Entered By: Sandre Kitty on 07/03/2020 07:43:23

## 2020-07-10 ENCOUNTER — Other Ambulatory Visit: Payer: Self-pay

## 2020-07-10 ENCOUNTER — Encounter (HOSPITAL_BASED_OUTPATIENT_CLINIC_OR_DEPARTMENT_OTHER): Payer: Medicare Other | Admitting: Internal Medicine

## 2020-07-10 DIAGNOSIS — E1151 Type 2 diabetes mellitus with diabetic peripheral angiopathy without gangrene: Secondary | ICD-10-CM | POA: Diagnosis not present

## 2020-07-10 NOTE — Progress Notes (Signed)
TIANDRE, TEALL ([id number]) Visit Report for 07/10/2020 Debridement Details Patient Name: Date of Service: [name] 07/10/2020 7:30 A M Medical Record Number: [id number] Patient Account Number: [id number] Date of Birth/Sex: Treating RN: 1940/09/27 (79 y.o. [name] Provider: [name]., [name] Other Clinician: Referring Provider: Treating Provider/Extender: [name], [name] in Treatment: 39 Debridement Performed for Assessment: Wound #4 Right,Distal,Lateral Lower Leg Performed By: Physician Ricard Dillon., MD Debridement Type: Debridement Severity of Tissue Pre Debridement: Fat layer exposed Level of Consciousness (Pre-procedure): Awake and Alert Pre-procedure Verification/Time Out Yes - 08:00 Taken: Start Time: 08:01 Pain Control: Lidocaine 4% T opical Solution T Area Debrided (L x W): otal 2 (cm) x 1.3 (cm) = 2.6 (cm) Tissue and other material debrided: Viable, Non-Viable, Slough, Subcutaneous, Skin: Dermis , Fibrin/Exudate, Slough Level: Skin/Subcutaneous Tissue Debridement Description: Excisional Instrument: Curette Bleeding: Moderate Hemostasis Achieved: Pressure End Time: 08:06 Procedural Pain: 0 Post Procedural Pain: 0 Response to Treatment: Procedure was tolerated well Level of Consciousness (Post- Awake and Alert procedure): Post Debridement Measurements of Total Wound Length: (cm) 2 Width: (cm) 1.3 Depth: (cm) 0.5 Volume: (cm) 1.021 Character of Wound/Ulcer Post Debridement: Requires Further Debridement Severity of Tissue Post Debridement: Fat layer exposed Post Procedure Diagnosis Same as Pre-procedure Electronic Signature(s) Signed: 07/10/2020 5:00:15 PM By: Linton Ham MD Signed: 07/10/2020 5:09:44 PM By: Deon Pilling Entered By: Linton Ham on 07/10/2020 08:12:01 -------------------------------------------------------------------------------- HPI Details Patient Name: Date of  Service: [place] 07/10/2020 7:30 A M Medical Record Number: [id number] Patient Account Number: [id number] Date of Birth/Sex: Treating RN: 1940-09-28 (79 y.o. [name], [name] Primary Care Provider: [name]., [name] Other Clinician: Referring Provider: Treating Provider/Extender: [name], [name] in Treatment: 39 History of Present Illness HPI Description: ADMISSION 10/11/2019 This is a 79 year old man with a history of a severe cardiomyopathy with an ejection fraction of about 20%, chronic renal failure stage III. He is listed as a type II diabetic in epic although the patient denies this. He also has a history of PVD. He states for the last month he has had wounds on his bilateral lower extremities that started off as blisters which denuded. He has areas on the left lateral calf and 2 on the right lateral. He has an area on the left first met head which he did not know was there he we identified this on intake. He has been using Silvadene cream provided by his primary care physician but he is complaining that this burns. 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 He was acutely ill at that time complicating GI bleed. ABIs in our clinic were 1.16 on the right and 1.13 on the left 2/25; the patient comes in with his areas on the left lateral and right lateral calf. There is also an area over the left first MTP bunion deformity. We have been using Sorbact. His edema control is fairly good 3/4; left lateral and right lateral calf. Most of his wounds are in the same position tightly adherent nonviable  debris. On the right we debrided the superior wound on  the left both wounds. The area on his bunion over the left first MTP medially is I think just about closed. We have been using Sorbact without a lot of success changed to Iodoflex under compression 3/11; left lateral and right lateral calf perhaps minor improvement in the surface condition. We have been using Iodoflex. The area over the bunion of the left first MTP has closed over 3/18; left lateral and right lateral calf not much improvement. We have been using Iodoflex. Aggressive debridement last week. 3/25; left lateral and right lateral calf. We have been using Iodoflex. There is some improvement in the superior area on the right and 2 on the lateral left although there is still a lot of debris on the surface. The inferior area on the right is still a completely nonviable surface. 4/8; left lateral and right lateral calfs. Essentially mirror-image looking wounds 2 wounds on each side in close juxtaposition we have been using Iodoflex with some improvement in the very adherent fibrinous debris but not a lot. The patient has arterial studies next Wednesday morning and venous studies next Thursday morning. I have been avoiding any further aggressive debridement until we see the arterial study results. His ABIs were fairly good in this clinic and is dorsalis pedis pulses are palpable but the wound beds are pale. 4/15; left lateral and right lateral calfs. Essentially mirror-image wounds with 2 wounds on each side in close juxtaposition but with rims of separating normal tissue. We have been using Iodoflex. The base of the wounds has been cleaning up quite nicely ARTERIAL STUDIES were done showing the patient had an ABI on the right of 1.27 with triphasic waveforms and a TBI of 0.90 on the left triphasic waveforms with an ABI of 1.28 and a TBI of 0.87. No evidence of arterial disease VENOUS REFLUX STUDIES; were done yesterday we do not have this  report yet. 4/23; VENOUS REFLUX STUDIES did not show any significant reflux right lower extremity. No evidence of a DVT He did have significant reflux in the common . femoral vein on the left but nothing else was listed as significant. He did not have a DVT. We have been using Iodoflex under compression his wounds are making progress. 4/29; improvements in the wound surface continue. We have been using Iodoflex. He has a 20% out-of-pocket co-pay for Apligraf which is unfortunate. We changed him to Sorbact today 5/6; started on Sorbact last week. Better looking wound surface but not much change in dimensions. 01/24/20-Patient is back at 3 weeks, wound surfaces are about the same but very minimal slough on the right leg wounds, however there is blistering on both legs adjacent to the wounds, patient denies any other symptoms or new symptoms. His studies have been reviewed and do not indicate any significant arterial or venous disease. But he is attending once in 3 weeks with home health changing in between 6/3; patient has 4 wounds 2 on each side of his lateral lower legs. These wounds are somewhat improved. He apparently arrived in clinic last week with a large blister on the right lateral lower leg that had denuded into the wound. They changed to silver alginate. Still under compression. He has straight Medicare and unfortunately has an unlimited co-pay for advanced treatment options. 6/10; his original 4 wounds are all better especially on the left lateral lower leg. Areas on the right medial have a better looking surface were using silver collagen The blister on the right lateral lower leg from last week  has an open area however this looks fairly healthy were using silver alginate on this He comes in today having "stubbed" his left second toe he has an abrasion at the base of the nailbed I think he probably caught the nail which is mycotic. 6/24; blister on the right lateral lower leg has healed.  There is no additional wounds. The traumatic left second toe is also closed. The 4 that he has are all better 7/1; all of the patient's wounds are smaller except for the proximal one on the left lateral calf. We are using silver collagen 7/22; patient has home health. Relatively refractory wounds on the lateral part of both mid calfs. Each of them 2 wounds. The areas on the left are doing much better in fact the distal 1 looks like it is on his way to closure. The right required debridement with adherent fibrinous debris under illumination. We have been using silver collagen. Previous arterial studies were within normal limits he also had venous reflux studies 04/03/2020 upon evaluation today patient appears to be doing excellent with regard to his lower extremity ulcers. He is going to require a little bit of debridement on the wounds on the right especially in order to clear away some biofilm and slough but this is minimal and overall he seems to be doing quite well. 8/19; the patient's wounds on the left lateral lower leg just above closed. However the 2 on the lateral part of the right have a nonviable necrotic surface. This is disappointing. There is also no change in dimensions. He has had arterial studies as well as venous reflux studies 8/26; very disappointing today. Even the more superficial areas on the left are about the same as last week. Still 2 punched-out areas completely unchanged on the right. We have been using silver collagen really making no progress. 9/2; changed to Iodoflex last week better looking wound surfaces especially on the right although they are deep and punched out. The areas on the left are more superficial open 1 of these is almost fully epithelialized although it has been this way for at least 2 weeks. He has a 20% co-pay [Medicare only] unaffordable for a skin substitute. Had some thought about a snap VAC on the deep areas on the right leg we will try to put this  through in the insurance 9/9; punched-out wounds on the bilateral lateral lower extremities. We have been operating as if the these were chronic venous wounds with secondary lymphedema. The areas on the left have been doing well in fact one of them is closed over. We have had no improvement in the areas on the right we have been using Iodoflex to help with surface debridement. The areas on the right are deeper wider. I do not see any evidence of infection. Home health is not been putting the wraps on high enough he has localized lymphedema right above the wounds. He has had arterial and venous studies already 9/16; punched-out mirror-image wounds on his bilateral lateral mid calfs. We have 1 closed on the left lateral the other smaller. For the first time today the areas on the right lateral actually looks some better. I did a biopsy of 1 of these last time although we still do not have this result. HOWEVER he arrives in clinic today complaining of chest pain overnight which felt like a brick on his chest. He also had 2 black watery bowel movements. He does not have nausea or vomiting. He is not describing abdominal  pain. He has no prior history of diarrhea heartburn etc. 9/30; sent this patient to the ER on 9/16. 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 He is on twice daily PPIs Protonix. His admission hemoglobin was exceptionally low at 5. 8.. Discharged at 8.1. 4 unit transfusion. 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 His diuretic dose was actually decreased because of elevated creatinine currently at torsemide 20 mg not felt to be a Coumadin candidate. Patient states he feels well. The areas on his left leg are healed. The areas on the right look better. These have been very refractory wounds. He does not have stockings we will make arrangements for this today 10/7; the areas on the left leg  are healed we will transition him into his 20/30 stocking today. Continued improvement in the areas on the right lateral leg 10/21; the areas on the left leg remain healed at this 2-week follow-up however there is a history that he developed some drainage from one of the wound sites with home health therefore going ahead and wrapping him. He was supposed to be on 20/30 stockings although the history here is vague and I am not sure that this represents a stocking failure or not. We have been wrapping his right leg with the one remaining wound is the distal wound. We have been using silver collagen 10/28 the patient only has 1 wound remaining on the right lower calf. This still has 3 mm of depth which is unchanged but the surface area is smaller. The superior wound on the right lateral is closed. The 2 wounds on the left remain closed and he is using his compression stocking 11/4 1 remaining wound on the right lower calf. Under illumination not a viable surface we have been using silver collagen we had good effect on the other areas but this 1 appears to be stalled. There is no open area on the left leg he is using his own stocking. 11/11; 1 remaining wound on the right lower calf. Surface of this is a lot better than last week but still requiring debridement I am using Iodoflex but looking forward to changing the primary dressing to perhaps endoform Electronic Signature(s) Signed: 07/10/2020 5:00:15 PM By: Linton Ham MD Entered By: Linton Ham on 07/10/2020 08:13:45 -------------------------------------------------------------------------------- Physical Exam Details Patient Name: Date of Service: [place] 07/10/2020 7:30 [place] Record Number: [id number] Patient Account Number: [id number] Date of Birth/Sex: Treating RN: March 14, 1941 (79 y.o. [name], [name] Primary Care Provider: [name]., [name] Other Clinician: Referring Provider: Treating Provider/Extender:  [name], [name] in Treatment: 39 Cardiovascular Pedal pulses palpable on the right. Excellent edema control. Notes Wound exam; 1 of 2 areas on the right lateral lower extremity. Better surface however still debris on the surface under illumination and overhanging skin debriding of subcutaneous material from the surface and the overhanging material. Electronic Signature(s) Signed: 07/10/2020 5:00:15 PM By: Linton Ham MD Entered By: Linton Ham on 07/10/2020 08:15:04 -------------------------------------------------------------------------------- Physician Orders Details Patient Name: Date of Service: [place] 07/10/2020 7:30 [place] Record Number: [id number] Patient Account Number: [id number] Date of Birth/Sex: Treating RN: 1941/08/04 (79 y.o. 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 Dressing Change Frequency Other: - change twice a week. once a week from wound center and once a week home health. Skin Barriers/Peri-Wound Care Moisturizing lotion - patient to lotion left leg daily. lotion right leg with dressing changes. Wound Cleansing May shower with protection. - use cast protectors on the days dressings are not changed. May shower and wash wound  with soap and water. - with dressing changes only. Primary Wound Dressing Wound #4 Right,Distal,Lateral Lower Leg Iodoflex Secondary Dressing Dry Gauze Edema Control 4 layer compression - Right Lower Extremity - Please ensure to wrap just above the toes and just the below the knee. Avoid standing for long periods of time Elevate legs to the level of the heart or above for 30 minutes daily and/or when sitting, a frequency of: - throughout the day. Exercise regularly Support Garment 20-30 mm/Hg pressure to: - patient to wear compression stocking to left leg. Apply in the morning and remove at night. [place] skilled nursing for wound care. [facility] home health Electronic Signature(s) Signed: 07/10/2020 5:00:15 PM By: Linton Ham MD Signed: 07/10/2020 5:09:44 PM By: Deon Pilling Entered By: Deon Pilling on 07/10/2020 08:08:49 -------------------------------------------------------------------------------- Problem List Details Patient Name: Date of Service: [place] 07/10/2020 7:30 A M Medical Record Number: [id number] Patient Account Number: [id number] Date of Birth/Sex: Treating RN: 10-Nov-1940 (79 y.o. 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/06/2020 No Yes L97.812 Non-pressure chronic ulcer of other part of right lower leg with fat layer 01/03/2020 No Yes exposed S90.812D Abrasion, left foot, subsequent encounter 02/07/2020 No Yes I42.9 Cardiomyopathy,  unspecified 05/15/2020 No Yes K92.1 [place]ena 05/15/2020 No Yes Inactive Problems ICD-10 Code Description Active Date Inactive Date L97.521 Non-pressure chronic ulcer of other part of left foot limited to breakdown of skin 10/11/2019 10/11/2019 Resolved Problems ICD-10 Code Description Active Date Resolved Date 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 10/11/2019 10/11/2019 Electronic Signature(s) Signed: 07/10/2020 5:00:15 PM By: Linton Ham MD Entered By: Linton Ham on 07/10/2020 08:11:28 -------------------------------------------------------------------------------- Progress Note Details Patient Name: Date of Service: [place] 07/10/2020 7:30 A M Medical Record Number: [id number] Patient Account Number: [id number] Date of Birth/Sex: Treating RN: [birth year] (79 y.o. [name], [name] Primary Care Provider: [name]., [name] Other Clinician: Referring Provider: Treating Provider/Extender: [name], [name] in Treatment: 39 Subjective History of Present Illness (HPI) ADMISSION 10/11/2019 This is a 79 year old man with a history of a severe cardiomyopathy with an ejection fraction of about 20%, chronic renal failure stage III. He is listed as a type II diabetic in epic although the  patient denies this. He also has a history of PVD. He states for the last month he has had wounds on his bilateral lower extremities that started off as blisters which denuded. He has areas on the left lateral calf and 2 on the right lateral. He has an area on the left first met head which he did not know was there he we identified this on intake. He has been using Silvadene cream provided by his primary care physician but he is complaining that this burns. 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 He was acutely ill at that time complicating GI bleed. ABIs in our clinic were 1.16 on the right and 1.13 on the left 2/25; the patient comes in with his areas on the left lateral and right lateral calf. There is also an area over the left first MTP bunion deformity. We have been using Sorbact. His edema control is fairly good 3/4; left lateral and right lateral calf. Most of his wounds are in the same position tightly adherent nonviable debris. On the right we debrided the superior wound on the left both wounds. The area on his bunion over the left first MTP medially is I think just about closed. We have been using Sorbact without a lot of success changed to Iodoflex under compression 3/11; left lateral and right lateral calf perhaps minor improvement in the surface condition. We have been using Iodoflex. The area over the bunion of the left first MTP has closed over 3/18; left lateral and right lateral calf not much improvement. We have been using Iodoflex. Aggressive debridement last week. 3/25; left lateral and right lateral calf. We have been using Iodoflex. There is some improvement in the superior area on the right and 2 on the lateral left although there is still a lot of debris on the surface. The inferior area on the right is still a completely nonviable surface. 4/8; left lateral and right lateral calfs. Essentially mirror-image looking wounds 2 wounds on each side in close juxtaposition we have been using Iodoflex with some improvement in the very adherent fibrinous debris but not a lot. The patient has arterial studies next Wednesday morning and venous studies next Thursday morning. I have been avoiding any further aggressive debridement until we see the arterial study  results. His ABIs were fairly good in this clinic and is dorsalis pedis pulses are palpable but the wound beds are pale. 4/15; left lateral and right lateral calfs. Essentially mirror-image wounds with 2 wounds on each side in close juxtaposition but with rims of separating normal tissue. We have been using Iodoflex. The base of the wounds has been cleaning up quite nicely ARTERIAL STUDIES were done showing the patient had an ABI on the right of 1.27 with triphasic waveforms and a TBI of 0.90 on the left triphasic waveforms with an ABI of 1.28 and a TBI of 0.87. No evidence of arterial disease VENOUS REFLUX STUDIES; were done yesterday we do not have this report yet. 4/23; VENOUS REFLUX STUDIES did not show any significant reflux right lower extremity. No evidence of a DVT He did have significant reflux in the common . femoral vein on the left  but nothing else was listed as significant. He did not have a DVT. We have been using Iodoflex under compression his wounds are making progress. 4/29; improvements in the wound surface continue. We have been using Iodoflex. He has a 20% out-of-pocket co-pay for Apligraf which is unfortunate. We changed him to Sorbact today 5/6; started on Sorbact last week. Better looking wound surface but not much change in dimensions. 01/24/20-Patient is back at 3 weeks, wound surfaces are about the same but very minimal slough on the right leg wounds, however there is blistering on both legs adjacent to the wounds, patient denies any other symptoms or new symptoms. His studies have been reviewed and do not indicate any significant arterial or venous disease. But he is attending once in 3 weeks with home health changing in between 6/3; patient has 4 wounds 2 on each side of his lateral lower legs. These wounds are somewhat improved. He apparently arrived in clinic last week with a large blister on the right lateral lower leg that had denuded into the wound. They changed to  silver alginate. Still under compression. He has straight Medicare and unfortunately has an unlimited co-pay for advanced treatment options. 6/10; his original 4 wounds are all better especially on the left lateral lower leg. 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 6/24; blister on the right lateral lower leg has healed. There is no additional wounds. The traumatic left second toe is also closed. The 4 that he has are all better 7/1; all of the patient's wounds are smaller except for the proximal one on the left lateral calf. We are using silver collagen 7/22; patient has home health. Relatively refractory wounds on the lateral part of both mid calfs. Each of them 2 wounds. The areas on the left are doing much better in fact the distal 1 looks like it is on his way to closure. The right required debridement with adherent fibrinous debris under illumination. We have been using silver collagen. Previous arterial studies were within normal limits he also had venous reflux studies 04/03/2020 upon evaluation today patient appears to be doing excellent with regard to his lower extremity ulcers. He is going to require a little bit of debridement on the wounds on the right especially in order to clear away some biofilm and slough but this is minimal and overall he seems to be doing quite well. 8/19; the patient's wounds on the left lateral lower leg just above closed. However the 2 on the lateral part of the right have a nonviable necrotic surface. This is disappointing. There is also no change in dimensions. He has had arterial studies as well as venous reflux  studies 8/26; very disappointing today. Even the more superficial areas on the left are about the same as last week. Still 2 punched-out areas completely unchanged on the right. We have been using silver collagen really making no progress. 9/2; changed to Iodoflex last week better looking wound surfaces especially on the right although they are deep and punched out. The areas on the left are more superficial open 1 of these is almost fully epithelialized although it has  been this way for at least 2 weeks. He has a 20% co-pay [Medicare only] unaffordable for a skin substitute. Had some thought about a snap VAC on the deep areas on the right leg we will try to put this through in the insurance 9/9; punched-out wounds on the bilateral lateral lower extremities. We have been operating as if the these were chronic venous wounds with secondary lymphedema. The areas on the left have been doing well in fact one of them is closed over. We have had no improvement in the areas on the right we have been using Iodoflex to help with surface debridement. The areas on the right are deeper wider. I do not see any evidence of infection. Home health is not been putting the wraps on high enough he has localized lymphedema right above the wounds. He has had arterial and venous studies already 9/16; punched-out mirror-image wounds on his bilateral lateral mid calfs. We have 1 closed on the left lateral the other smaller. For the first time today the areas on the right lateral actually looks some better. I did a biopsy of 1 of these last time although we still do not have this result. HOWEVER he arrives in clinic today complaining of chest pain overnight which felt like a brick on his chest. He also had 2 black watery bowel movements. He does not have nausea or vomiting. He is not describing abdominal pain. He has no prior history of diarrhea heartburn etc. 9/30; sent this patient to the ER on 9/16. 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 He is on twice daily PPIs Protonix. His admission hemoglobin was exceptionally low at 5. 8.. Discharged at 8.1. 4 unit transfusion. 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 His diuretic dose was actually decreased because of elevated creatinine currently at torsemide 20 mg not felt to be a Coumadin candidate. Patient states he feels well. The areas on his left leg are healed. The areas on the right look better. These have been very refractory wounds. He does not have stockings we will make arrangements for this today 10/7; the areas on the left leg are healed we will transition him into his 20/30 stocking today. Continued improvement in the areas on the right lateral leg 10/21; the areas on the left leg remain healed at this 2-week follow-up however there is a history that he developed some drainage from one of the wound sites with home health therefore going ahead and wrapping him. He was supposed to be on 20/30 stockings although the history here is vague and I am not sure that this represents a stocking failure or not. We have been wrapping his right leg with the one remaining wound is the distal wound. We have been using silver collagen 10/28 the patient only has 1 wound remaining on the right lower calf. This still has 3 mm of depth which is unchanged but the surface area is smaller. The superior wound on the right lateral is closed. The 2 wounds on the left remain closed and he is using his compression stocking 11/4 1 remaining wound on the right lower calf. Under illumination not a viable surface we have been using silver collagen we had good effect on the other areas but this 1 appears to be stalled. There is no open area on the left leg he is using his own stocking. 11/11; 1 remaining  wound on the right lower calf. Surface of this is a lot better than last week but still  requiring debridement I am using Iodoflex but looking forward to changing the primary dressing to perhaps endoform Objective Constitutional Vitals Time Taken: 7:48 AM, Height: 71 in, Weight: 198 lbs, BMI: 27.6, Temperature: 97.6 F, Pulse: 69 bpm, Respiratory Rate: 18 breaths/min, Blood Pressure: 97/65 mmHg. Cardiovascular Pedal pulses palpable on the right. Excellent edema control. General Notes: Wound exam; 1 of 2 areas on the right lateral lower extremity. Better surface however still debris on the surface under illumination and overhanging skin debriding of subcutaneous material from the surface and the overhanging material. Integumentary (Hair, Skin) Wound #4 status is Open. Original cause of wound was Blister. The wound is located on the Right,Distal,Lateral Lower Leg. The wound measures 2cm length x 1.3cm width x 0.5cm depth; 2.042cm^2 area and 1.021cm^3 volume. There is Fat Layer (Subcutaneous Tissue) exposed. There is no tunneling or undermining noted. There is a medium amount of serosanguineous drainage noted. The wound margin is well defined and not attached to the wound base. There is large (67- 100%) pink granulation within the wound bed. There is a small (1-33%) amount of necrotic tissue within the wound bed including Adherent Slough. Assessment Active Problems ICD-10 Chronic venous hypertension (idiopathic) with inflammation of bilateral lower extremity Lymphedema, not elsewhere classified Non-pressure chronic ulcer of other part of left lower leg with fat layer exposed Non-pressure chronic ulcer of other part of right lower leg with fat layer exposed Abrasion, left foot, subsequent encounter Cardiomyopathy, unspecified [facility] Procedures Wound #4 Pre-procedure diagnosis of Wound #4 is a Venous Leg Ulcer located on the Right,Distal,Lateral Lower Leg .Severity of Tissue Pre Debridement is: Fat layer exposed. There was a Excisional Skin/Subcutaneous Tissue Debridement with a  total area of 2.6 sq cm performed by Ricard Dillon., MD. With the following instrument(s): Curette to remove Viable and Non-Viable tissue/material. Material removed includes Subcutaneous Tissue, Slough, Skin: Dermis, and Fibrin/Exudate after achieving pain control using Lidocaine 4% Topical Solution. A time out was conducted at 08:00, prior to the start of the procedure. A Moderate amount of bleeding was controlled with Pressure. The procedure was tolerated well with a pain level of 0 throughout and a pain level of 0 following the procedure. Post Debridement Measurements: 2cm length x 1.3cm width x 0.5cm depth; 1.021cm^3 volume. Character of Wound/Ulcer Post Debridement requires further debridement. Severity of Tissue Post Debridement is: Fat layer exposed. 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 There was a Four Layer Compression Therapy Procedure with a pre-treatment ABI of 1.2 by Baruch Gouty, RN. Post procedure Diagnosis Wound #4: Same as Pre-Procedure Plan Follow-up Appointments: Return Appointment in 1 week. Dressing Change Frequency: Other: - change twice a week. once a week from wound center and once a week home health. Skin Barriers/Peri-Wound Care: Moisturizing lotion - patient to lotion left leg daily. lotion right leg with dressing changes. Wound Cleansing: May shower with protection. - use cast protectors on the days dressings are not changed. May shower and wash wound with soap and water. - with dressing changes only. Primary Wound Dressing: Wound #4 Right,Distal,Lateral Lower Leg: Iodoflex Secondary Dressing: Dry Gauze Edema Control: 4 layer compression - Right Lower Extremity - Please ensure to wrap just above the toes and just the below the knee. Avoid standing for long periods of time Elevate legs to the  level of the heart or above for 30  minutes daily and/or when sitting, a frequency of: - throughout the day. Exercise regularly Support Garment 20-30 mm/Hg pressure to: - patient to wear compression stocking to left leg. Apply in the morning and remove at night. Home Health: [facility] for wound care. - [facility] 1. Debridement today and I am still going to use Iodoflex for another week however I am looking forward to using endoform next week and hopeful to stimulate some granulation without further debridement 2. Look into an advanced treatment product. My mind says that we already looked through this and he had an unaffordable co-pay however I am not completely sure about this at this 3. We have good edema control. The wound still has considerable depth but a much better looking surface today Electronic Signature(s) Signed: 07/10/2020 5:00:15 PM By: Linton Ham MD Entered By: Linton Ham on 07/10/2020 08:17:44 -------------------------------------------------------------------------------- SuperBill Details Patient Name: Date of Service: [place] 07/10/2020 Medical Record Number: [id number] Patient Account Number: [id number] Date of Birth/Sex: Treating RN: October 13, 1940 (79 y.o. [name], [name] Primary Care Provider: [name]., [name] Other Clinician: Referring Provider: Treating Provider/Extender: [name], [name] in Treatment: 39 Diagnosis Coding ICD-10 Codes Code Description [phone] Chronic venous hypertension (idiopathic) with inflammation of bilateral lower extremity I89.0 Lymphedema, not elsewhere classified L97.822 Non-pressure chronic ulcer of other part of left lower leg with fat layer exposed L97.812 Non-pressure chronic ulcer of other part of right lower leg with fat layer exposed S90.812D Abrasion, left foot, subsequent encounter I42.9 Cardiomyopathy, unspecified K92.1 [facility] Facility Procedures The patient  participates with Medicare or their insurance follows the Medicare Facility Guidelines: CPT4 Code Description Modifier Quantity [id number] 11042 - DEB SUBQ TISSUE 20 SQ CM/< 1 ICD-10 Diagnosis Description S00.447 Non-pressure chronic ulcer of  other part of right lower leg with fat layer exposed Physician Procedures : CPT4 Code Description Modifier [id number] - WC PHYS SUBQ TISS 20 SQ CM ICD-10 Diagnosis Description S30.141 Non-pressure chronic ulcer of other part of right lower leg with fat layer exposed Quantity: 1 Electronic Signature(s) Signed: 07/10/2020 5:00:15 PM By: Linton Ham MD Entered By: Linton Ham on 07/10/2020 08:18:01

## 2020-07-15 NOTE — Progress Notes (Signed)
[name], [name] ([id number]) Visit Report for 07/10/2020 Arrival Information Details Patient Name: Date of Service: [place] [name] 07/10/2020 7:30 A M Medical Record Number: [id number] Patient Account Number: [id number] Date of Birth/Sex: Treating RN: February 08, 1941 (79 y.o. [name] Primary Care Mirenda Baltazar: [name]., [name] Other Clinician: Referring Jaleah Lefevre: Treating Kaytelyn Glore/Extender: [name], [name] in Treatment: 39 Visit Information History Since Last Visit Added or deleted any medications: No Patient Arrived: Cane Any new allergies or adverse reactions: No Arrival Time: 07:48 Had a fall or experienced change in No Accompanied By: self activities of daily living that may affect Transfer Assistance: None risk of falls: Patient Identification Verified: Yes Signs or symptoms of abuse/neglect since last visito No Secondary Verification Process Completed: Yes Hospitalized since last visit: No Patient Requires Transmission-Based Precautions: No Implantable device outside of the clinic excluding No Patient Has Alerts: Yes cellular tissue based products placed in the center Patient Alerts: Patient on Blood Thinner since last visit: Has Dressing in Place as Prescribed: Yes Pain Present Now: No Electronic Signature(s) Signed: 07/10/2020 8:03:20 AM By: Sandre Kitty Entered By: Sandre Kitty on 07/10/2020 07:48:25 -------------------------------------------------------------------------------- Compression Therapy Details Patient Name: Date of Service: [name] [name] 07/10/2020 7:30 [name] Record Number: [id number] Patient Account Number: [id number] Date of Birth/Sex: Treating RN: 1940-12-02 (79 y.o. [name] Primary Care Odie Rauen: [name]., [name] Other Clinician: Referring Tyrin Herbers: Treating Verlee [name]/Extender: [name], [name] in Treatment: 39 Compression Therapy Performed for  Wound Assessment: Wound #4 Right,Distal,Lateral Lower Leg Performed By: Clinician Baruch Gouty, RN Compression Type: Four Layer Pre Treatment ABI: 1.2 Post Procedure Diagnosis Same as Pre-procedure Electronic Signature(s) Signed: 07/10/2020 5:09:44 PM By: Deon Pilling Entered By: Deon Pilling on 07/10/2020 08:07:35 -------------------------------------------------------------------------------- Encounter Discharge Information Details Patient Name: Date of Service: [place] 07/10/2020 7:30 [place] Record Number: [id number] Patient Account Number: [id number] Date of Birth/Sex: Treating RN: Dec 19, 1940 (79 y.o. [name] Laraya Pestka: [name]., [name] Other Clinician: Referring Dagoberto Nealy: Treating Tehya Leath/Extender: [name], [name] in Treatment: 39 Encounter Discharge Information Items Post Procedure Vitals Discharge Condition: Stable Temperature (F): 97.6 Ambulatory Status: Cane Pulse (bpm): 69 Discharge Destination: Home Respiratory Rate (breaths/min): 18 Transportation: Private Auto Blood Pressure (mmHg): 97/65 Accompanied By: alone Schedule Follow-up Appointment: Yes Clinical Summary of Care: Patient Declined Electronic Signature(s) Signed: 07/14/2020 5:51:53 PM By: Levan Hurst RN, BSN Entered By: Levan Hurst on 07/10/2020 08:35:50 -------------------------------------------------------------------------------- Lower Extremity Assessment Details Patient Name: Date of Service: [place] 07/10/2020 7:30 A M Medical Record Number: [id number] Patient Account Number: [id number] Date of Birth/Sex: Treating RN: 1941-07-06 (79 y.o. [name] [name]: [name]., [name] Other Clinician: Referring [name]: Treating [name]/Extender: [name], [name] in Treatment: 39 Edema Assessment Assessed: [Left: No] [Right: No] Edema: [Left: Yes]  [Right: Yes] Calf Left: Right: Point of Measurement: 53 cm From Medial Instep 38 cm 33 cm Ankle Left: Right: Point of Measurement: 14 cm From Medial Instep 22 cm 22 cm Vascular Assessment Pulses: Dorsalis Pedis Palpable: [Right:Yes] Electronic Signature(s) Signed: 07/10/2020 8:28:44 AM By: Levan Hurst RN, BSN Entered By: Levan Hurst on 07/10/2020 07:59:23 -------------------------------------------------------------------------------- Multi Wound Chart Details Patient Name: Date of Service: [place] 07/10/2020 7:30 [place] Number: [id number] Patient Account Number:  [id number] Date of Birth/Sex: Treating RN: [birth year] (79 y.o. [name], [name] Primary Care Shilah Hefel: [name]., [name] Other Clinician: Referring [name]: Treating Jacarie Pate/Extender: [name], [name] in Treatment: 39 Vital Signs Height(in): 71 Pulse(bpm): 69 Weight(lbs): 198 Blood Pressure(mmHg): 97/65 Body Mass Index(BMI): 28 Temperature(F): 97.6 Respiratory Rate(breaths/min): 18 Photos: [4:No Photos Right, Distal, Lateral Lower Leg] [N/A:N/A N/A] Wound Location: [4:Blister] [N/A:N/A] Wounding Event: [4:Venous Leg Ulcer] [N/A:N/A] Primary Etiology: [4:Anemia, Congestive Heart Failure,] [N/A:N/A] Comorbid History: [4:Hypertension, Peripheral Venous Disease, 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] [N/A:N/A] Date Acquired: [4:39] [N/A:N/A] Weeks of Treatment: [4:Open] [N/A:N/A] Wound Status: [4:2x1.3x0.5] [N/A:N/A] Measurements L x W x D (cm) [4:2.042] [N/A:N/A] A (cm) : rea [4:1.021] [N/A:N/A] Volume (cm) : [4:78.00%] [N/A:N/A] % Reduction in A [4:rea: -9.80%] [N/A:N/A] % Reduction in Volume: [4:Full Thickness Without Exposed] [N/A:N/A] Classification: [4:Support Structures Medium] [N/A:N/A] Exudate A mount: [4:Serosanguineous] [N/A:N/A] Exudate Type: [4:red, brown] [N/A:N/A] Exudate Color: [4:Well defined, not attached] [N/A:N/A] Wound Margin:  [4:Large (67-100%)] [N/A:N/A] Granulation A mount: [4:Pink] [N/A:N/A] Granulation Quality: [4:Small (1-33%)] [N/A:N/A] Necrotic A mount: [4:Fat Layer (Subcutaneous Tissue): Yes N/A] Exposed Structures: [4:Fascia: No Tendon: No Muscle: No Joint: No Bone: No Small (1-33%)] [N/A:N/A] Epithelialization: [4:Debridement - Excisional] [N/A:N/A] Debridement: Pre-procedure Verification/Time Out 08:00 [N/A:N/A] Taken: [4:Lidocaine 4% Topical Solution] [N/A:N/A] Pain Control: [4:Subcutaneous, Slough] [N/A:N/A] Tissue Debrided: [4:Skin/Subcutaneous Tissue] [N/A:N/A] Level: [4:2.6] [N/A:N/A] Debridement A (sq cm): [4:rea Curette] [N/A:N/A] Instrument: [4:Moderate] [N/A:N/A] Bleeding: [4:Pressure] [N/A:N/A] Hemostasis A chieved: [4:0] [N/A:N/A] Procedural Pain: [4:0] [N/A:N/A] Post Procedural Pain: [4:Procedure was tolerated well] [N/A:N/A] Debridement Treatment Response: [4:2x1.3x0.5] [N/A:N/A] Post Debridement Measurements L x W x D (cm) [4:1.021] [N/A:N/A] Post Debridement Volume: (cm) [4:Compression Therapy] [N/A:N/A] Procedures Performed: [4:Debridement] Treatment Notes Electronic Signature(s) Signed: 07/10/2020 5:00:15 PM By: Linton Ham MD Signed: 07/10/2020 5:09:44 PM By: Deon Pilling Signed: 07/10/2020 5:09:44 PM By: Deon Pilling Entered By: Linton Ham on 07/10/2020 08:11:52 -------------------------------------------------------------------------------- Multi-Disciplinary Care Plan Details Patient Name: Date of Service: [place] 07/10/2020 7:30 A M Medical Record Number: [id number] Patient Account Number: [id number] Date of Birth/Sex: Treating RN: 07-07-1941 (79 y.o. [name], [name] Primary Care Bowdy Bair: [name]., [name] Other Clinician: Referring Sofia Vanmeter: Treating Maveric Debono/Extender: [name], [name] in Treatment: 39 Active Inactive Venous Leg Ulcer Nursing Diagnoses: Knowledge deficit related to disease process and  management Potential for venous Insuffiency (use before diagnosis confirmed) Goals: Patient will maintain optimal edema control Date Initiated: 05/01/2020 Target Resolution Date: 08/29/2020 Goal Status: Active Interventions: Assess peripheral edema status every visit. Compression as ordered Provide education on venous insufficiency Notes: Electronic Signature(s) Signed: 07/10/2020 5:09:44 PM By: Deon Pilling Entered By: Deon Pilling on 07/10/2020 07:57:38 -------------------------------------------------------------------------------- Pain Assessment Details Patient Name: Date of Service: [name] 07/10/2020 7:30 A M Medical Record Number: [id number] Patient Account Number: [id number] Date of Birth/Sex: Treating RN: 01-29-1941 (79 y.o. [name] Primary Care Alika Eppes: [name]., [name] Other Clinician: Referring Christiana Gurevich: Treating Dennice Tindol/Extender: [name], [name] in Treatment: 39 Active Problems Location of Pain Severity and Description of Pain Patient Has Paino No Site Locations Pain Management and Medication Current Pain Management: Electronic Signature(s) Signed: 07/10/2020 8:03:20 AM By: Sandre Kitty Signed: 07/10/2020 5:09:44 PM By: Deon Pilling Entered By: Sandre Kitty on 07/10/2020 07:48:48 -------------------------------------------------------------------------------- Patient/Caregiver Education Details Patient Name: Date of Service: [place] 11/11/2021andnbsp7:30 [place] Record Number: [id number] Patient Account Number: [id number] Date of  Birth/Gender: Treating RN: Sep 18, 1940 (79 y.o. [name] Primary Care Physician: [name]., [name] Other Clinician: Referring Physician: Treating Physician/Extender: [name], [name] in Treatment: 63 Education Assessment Education Provided To: Patient Education Topics Provided Venous: Handouts: Managing  Venous Disease and Related Ulcers Methods: Explain/Verbal Responses: Reinforcements needed Electronic Signature(s) Signed: 07/10/2020 5:09:44 PM By: Deon Pilling Entered By: Deon Pilling on 07/10/2020 07:57:53 -------------------------------------------------------------------------------- Wound Assessment Details Patient Name: Date of Service: [name] [facility] 07/10/2020 7:30 A M Medical Record Number: [id number] Patient Account Number: [id number] Date of Birth/Sex: Treating RN: 22-May-1941 (79 y.o. [name], [name] Primary Care Tenesia Escudero: [name]., [name] Other Clinician: Referring Rhylin Venters: Treating Ricketta Colantonio/Extender: [name], [name] in Treatment: 39 Wound Status Wound Number: 4 Primary Venous Leg Ulcer Etiology: Wound Location: Right, Distal, Lateral Lower Leg Wound Open Wounding Event: Blister Status: Date Acquired: 09/11/2019 Comorbid Anemia, Congestive Heart Failure, Hypertension, Peripheral Weeks Of Treatment: 39 History: Venous Disease, End Stage Renal Disease Clustered Wound: No Photos Photo Uploaded By: Mikeal Hawthorne on 07/14/2020 10:50:09 Wound Measurements Length: (cm) 2 Width: (cm) 1.3 Depth: (cm) 0.5 Area: (cm) 2.042 Volume: (cm) 1.021 % Reduction in Area: 78% % Reduction in Volume: -9.8% Epithelialization: Small (1-33%) Tunneling: No Undermining: No Wound Description Classification: Full Thickness Without Exposed Support Structures Wound Margin: Well defined, not attached Exudate Amount: Medium Exudate Type: Serosanguineous Exudate Color: red, brown Foul Odor After Cleansing: No Slough/Fibrino Yes Wound Bed Granulation Amount: Large (67-100%) Exposed Structure Granulation Quality: Pink Fascia Exposed: No Necrotic Amount: Small (1-33%) Fat Layer (Subcutaneous Tissue) Exposed: Yes Necrotic Quality: Adherent Slough Tendon Exposed: No Muscle Exposed: No Joint Exposed: No Bone Exposed: No Treatment  Notes Wound #4 (Right, Distal, Lateral Lower Leg) 1. Cleanse With Soap and water 2. Periwound Care Moisturizing lotion 3. Primary Dressing Applied Iodoflex 4. Secondary Dressing Dry Gauze 6. Support Layer Applied 4 layer compression Water quality scientist) Signed: 07/10/2020 8:28:44 AM By: Levan Hurst RN, BSN Signed: 07/10/2020 5:09:44 PM By: Deon Pilling Entered By: Levan Hurst on 07/10/2020 08:00:44 -------------------------------------------------------------------------------- Vitals Details Patient Name: Date of Service: [place] 07/10/2020 7:30 A M Medical Record Number: [id number] Patient Account Number: [id number] Date of Birth/Sex: Treating RN: 1941/03/29 (79 y.o. [name], [name] Primary Care Corena Tilson: [name]., [name] Other Clinician: Referring Kvon Mcilhenny: Treating Gjon Letarte/Extender: [name], [name] in Treatment: 39 Vital Signs Time Taken: 07:48 Temperature (F): 97.6 Height (in): 71 Pulse (bpm): 69 Weight (lbs): 198 Respiratory Rate (breaths/min): 18 Body Mass Index (BMI): 27.6 Blood Pressure (mmHg): 97/65 Reference Range: 80 - 120 mg / dl Electronic Signature(s) Signed: 07/10/2020 8:03:20 AM By: Sandre Kitty Entered By: Sandre Kitty on 07/10/2020 07:48:42

## 2020-07-17 ENCOUNTER — Other Ambulatory Visit: Payer: Self-pay

## 2020-07-17 ENCOUNTER — Encounter (HOSPITAL_BASED_OUTPATIENT_CLINIC_OR_DEPARTMENT_OTHER): Payer: Medicare Other | Admitting: Internal Medicine

## 2020-07-17 DIAGNOSIS — E1151 Type 2 diabetes mellitus with diabetic peripheral angiopathy without gangrene: Secondary | ICD-10-CM | POA: Diagnosis not present

## 2020-07-17 NOTE — Progress Notes (Signed)
[name], [name] ([id number]) Visit Report for 07/17/2020 Debridement Details Patient Name: Date of Service: [name] [name] 07/17/2020 7:30 A M Medical Record Number: [id number] Patient Account Number: [id number] Date of Birth/Sex: Treating RN: 1940-11-17 (79 y.o. [name] Primary Care Provider: [name]., [name] Other Clinician: Referring Provider: Treating Provider/Extender: Troy Leitz., [name]: 40 Debridement Performed for Assessment: Wound #4 Right,Distal,Lateral Lower Leg Performed By: Physician Troy Dillon., MD Debridement Type: Debridement Severity of Tissue Pre Debridement: Fat layer exposed Level of Consciousness (Pre-procedure): Awake and Alert Pre-procedure Verification/Time Out Yes - 08:05 Taken: Start Time: 08:06 Pain Control: Lidocaine 4% T opical Solution T Area Debrided (L x W): otal 2.5 (cm) x 2 (cm) = 5 (cm) Tissue and other material debrided: Viable, Non-Viable, Subcutaneous, Skin: Dermis , Fibrin/Exudate Level: Skin/Subcutaneous Tissue Debridement Description: Excisional Instrument: Curette Bleeding: Moderate Hemostasis Achieved: Pressure End Time: 08:09 Procedural Pain: 0 Post Procedural Pain: 0 Response to Patton: Procedure was tolerated well Level of Consciousness (Post- Awake and Alert procedure): Post Debridement Measurements of Total Wound Length: (cm) 2.5 Width: (cm) 2 Depth: (cm) 1.2 Volume: (cm) 4.712 Character of Wound/Ulcer Post Debridement: Improved Severity of Tissue Post Debridement: Fat layer exposed Post Procedure Diagnosis Same as Pre-procedure Electronic Signature(s) Signed: 07/17/2020 5:14:03 PM By: Troy Ham MD Signed: 07/17/2020 5:38:55 PM By: [name] Entered By: [name] on 07/17/2020 08:16:45 -------------------------------------------------------------------------------- HPI Details Patient Name: Date of Service: Troy [name]  07/17/2020 7:30 A M Medical Record Number: [id number] Patient Account Number: [id number] Date of Birth/Sex: Treating RN: [birth year] (79 y.o. [name], [name] Primary Care Provider: [name]., [name] Other Clinician: Referring Provider: Treating Provider/Extender: Troy Leitz., [name]: 10 History of Present Illness HPI Description: ADMISSION 10/11/2019 This is a 79 year old man with a history of a severe cardiomyopathy with an ejection fraction of about 20%, chronic renal failure stage III. He is listed as a type II diabetic in epic although the patient denies this. He also has a history of PVD. He states for the last month he has had wounds on his bilateral lower extremities that started off as blisters which denuded. He has areas on the left lateral calf and 2 on the right lateral. He has an area on the left first met head which he did not know was there he we identified this on intake. He has been using Silvadene cream provided by his primary care physician but he is complaining that this burns. 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 He was acutely ill at that time complicating GI bleed. ABIs in our clinic were 1.16 on the right and 1.13 on the left 2/25; the patient comes in with his areas on the left lateral and right lateral calf. There is also an area over the left first MTP bunion deformity. We have been using Sorbact. His edema control is fairly good 3/4; left lateral and right lateral calf. Most of his wounds are in the same position tightly adherent nonviable debris. On the right we debrided  the superior wound on the left both wounds.  The area on his bunion over the left first MTP medially is I think just about closed. We have been using Sorbact without a lot of success changed to Iodoflex under compression 3/11; left lateral and right lateral calf perhaps minor improvement in the surface condition. We have been using Iodoflex. The area over the bunion of the left first MTP has closed over 3/18; left lateral and right lateral calf not much improvement. We have been using Iodoflex. Aggressive debridement last week. 3/25; left lateral and right lateral calf. We have been using Iodoflex. There is some improvement in the superior area on the right and 2 on the lateral left although there is still a lot of debris on the surface. The inferior area on the right is still a completely nonviable surface. 4/8; left lateral and right lateral calfs. Essentially mirror-image looking wounds 2 wounds on each side in close juxtaposition we have been using Iodoflex with some improvement in the very adherent fibrinous debris but not a lot. The patient has arterial studies next Wednesday morning and venous studies next Thursday morning. I have been avoiding any further aggressive debridement until we see the arterial study results. His ABIs were fairly good in this clinic and is dorsalis pedis pulses are palpable but the wound beds are pale. 4/15; left lateral and right lateral calfs. Essentially mirror-image wounds with 2 wounds on each side in close juxtaposition but with rims of separating normal tissue. We have been using Iodoflex. The base of the wounds has been cleaning up quite nicely ARTERIAL STUDIES were done showing the patient had an ABI on the right of 1.27 with triphasic waveforms and a TBI of 0.90 on the left triphasic waveforms with an ABI of 1.28 and a TBI of 0.87. No evidence of arterial disease VENOUS REFLUX STUDIES; were done yesterday we do not have this report yet. 4/23; VENOUS REFLUX  STUDIES did not show any significant reflux right lower extremity. No evidence of a DVT He did have significant reflux in the common . femoral vein on the left but nothing else was listed as significant. He did not have a DVT. We have been using Iodoflex under compression his wounds are making progress. 4/29; improvements in the wound surface continue. We have been using Iodoflex. He has a 20% out-of-pocket co-pay for Apligraf which is unfortunate. We changed him to Sorbact today 5/6; started on Sorbact last week. Better looking wound surface but not much change in dimensions. 01/24/20-Patient is back at 3 weeks, wound surfaces are about the same but very minimal slough on the right leg wounds, however there is blistering on both legs adjacent to the wounds, patient denies any other symptoms or new symptoms. His studies have been reviewed and do not indicate any significant arterial or venous disease. But he is attending once in 3 weeks with home health changing in between 6/3; patient has 4 wounds 2 on each side of his lateral lower legs. These wounds are somewhat improved. He apparently arrived in clinic last week with a large blister on the right lateral lower leg that had denuded into the wound. They changed to silver alginate. Still under compression. He has straight Medicare and unfortunately has an unlimited co-pay for advanced Patton options. 6/10; his original 4 wounds are all better especially on the left lateral lower leg. Areas on the right medial have a better looking surface were using silver collagen The blister on the right lateral lower leg from last week has an open area  however this looks fairly healthy were using silver alginate on this He comes in today having "stubbed" his left second toe he has an abrasion at the base of the nailbed I think he probably caught the nail which is mycotic. 6/24; blister on the right lateral lower leg has healed. There is no additional wounds.  The traumatic left second toe is also closed. The 4 that he has are all better 7/1; all of the patient's wounds are smaller except for the proximal one on the left lateral calf. We are using silver collagen 7/22; patient has home health. Relatively refractory wounds on the lateral part of both mid calfs. Each of them 2 wounds. The areas on the left are doing much better in fact the distal 1 looks like it is on his way to closure. The right required debridement with adherent fibrinous debris under illumination. We have been using silver collagen. Previous arterial studies were within normal limits he also had venous reflux studies 04/03/2020 upon evaluation today patient appears to be doing excellent with regard to his lower extremity ulcers. He is going to require a little bit of debridement on the wounds on the right especially in order to clear away some biofilm and slough but this is minimal and overall he seems to be doing quite well. 8/19; the patient's wounds on the left lateral lower leg just above closed. However the 2 on the lateral part of the right have a nonviable necrotic surface. This is disappointing. There is also no change in dimensions. He has had arterial studies as well as venous reflux studies 8/26; very disappointing today. Even the more superficial areas on the left are about the same as last week. Still 2 punched-out areas completely unchanged on the right. We have been using silver collagen really making no progress. 9/2; changed to Iodoflex last week better looking wound surfaces especially on the right although they are deep and punched out. The areas on the left are more superficial open 1 of these is almost fully epithelialized although it has been this way for at least 2 weeks. He has a 20% co-pay [Medicare only] unaffordable for a skin substitute. Had some thought about a snap VAC on the deep areas on the right leg we will try to put this through in the insurance 9/9;  punched-out wounds on the bilateral lateral lower extremities. We have been operating as if the these were chronic venous wounds with secondary lymphedema. The areas on the left have been doing well in fact one of them is closed over. We have had no improvement in the areas on the right we have been using Iodoflex to help with surface debridement. The areas on the right are deeper wider. I do not see any evidence of infection. Home health is not been putting the wraps on high enough he has localized lymphedema right above the wounds. He has had arterial and venous studies already 9/16; punched-out mirror-image wounds on his bilateral lateral mid calfs. We have 1 closed on the left lateral the other smaller. For the first time today the areas on the right lateral actually looks some better. I did a biopsy of 1 of these last time although we still do not have this result. HOWEVER he arrives in clinic today complaining of chest pain overnight which felt like a brick on his chest. He also had 2 black watery bowel movements. He does not have nausea or vomiting. He is not describing abdominal pain. He has no  prior history of diarrhea heartburn etc. 9/30; sent this patient to the ER on 9/16. 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 He is on twice daily PPIs Protonix. His admission hemoglobin was exceptionally low at 5. 8.. Discharged at 8.1. 4 unit transfusion. 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 His diuretic dose was actually decreased because of elevated creatinine currently at torsemide 20 mg not felt to be a Coumadin candidate. Patient states he feels well. The areas on his left leg are healed. The areas on the right look better. These have been very refractory wounds. He does not have stockings we will make arrangements for this today 10/7; the areas on the left leg are healed we will transition  him into his 20/30 stocking today. Continued improvement in the areas on the right lateral leg 10/21; the areas on the left leg remain healed at this 2-week follow-up however there is a history that he developed some drainage from one of the wound sites with home health therefore going ahead and wrapping him. He was supposed to be on 20/30 stockings although the history here is vague and I am not sure that this represents a stocking failure or not. We have been wrapping his right leg with the one remaining wound is the distal wound. We have been using silver collagen 10/28 the patient only has 1 wound remaining on the right lower calf. This still has 3 mm of depth which is unchanged but the surface area is smaller. The superior wound on the right lateral is closed. The 2 wounds on the left remain closed and he is using his compression stocking 11/4 1 remaining wound on the right lower calf. Under illumination not a viable surface we have been using silver collagen we had good effect on the other areas but this 1 appears to be stalled. There is no open area on the left leg he is using his own stocking. 11/11; 1 remaining wound on the right lower calf. Surface of this is a lot better than last week but still requiring debridement I am using Iodoflex but looking forward to changing the primary dressing to perhaps endoform 11/18; 1 remaining wound on the right lower leg, venous insufficiency, vigorous debridement last week/Iodoflex. Depth of the wound is therefore larger but the wound looks clean. Still very gritty material at the surface requiring debridement Electronic Signature(s) Signed: 07/17/2020 5:14:03 PM By: Troy Ham MD Entered By: [name] on 07/17/2020 08:17:23 -------------------------------------------------------------------------------- Physical Exam Details Patient Name: Date of Service: Troy [place] 07/17/2020 7:30 [place] Record Number: [id number] Patient  Account Number: [id number] Date of Birth/Sex: Treating RN: 09-Apr-1941 (79 y.o. [name], [name] Primary Care Provider: [name]., [name] Other Clinician: Referring Provider: Treating Provider/Extender: Troy Leitz., [name]: 40 Constitutional Sitting or standing Blood Pressure is within target range for patient.. Pulse regular and within target range for patient.Marland Kitchen Respirations regular, non-labored and within target range.. Temperature is normal and within the target range for the patient.Marland Kitchen Appears in no distress. Notes Wound exam; right lateral lower leg. The wound is deep however under surface illumination the wound bed looks satisfactory. I used a #5 curette still a small amount of gritty surface debris hemostasis with direct pressure. Subcutaneous debridement. Edema control is good pedal pulses palpable Electronic Signature(s) Signed: 07/17/2020 5:14:03 PM By:  Troy Ham MD Entered By: [name] on 07/17/2020 08:18:17 -------------------------------------------------------------------------------- Physician Orders Details Patient Name: Date of Service: Troy [place] 07/17/2020 7:30 A M Medical Record Number: [id number] Patient Account Number: [id number] Date of Birth/Sex: Treating RN: March 14, 1941 (79 y.o. [name], [name] Primary Care Provider: [name]., [name] Other Clinician: Referring Provider: Treating Provider/Extender: Troy Leitz., [name]: 40 Verbal / Phone Orders: No Diagnosis Coding ICD-10 Coding Code Description [phone] Chronic venous hypertension (idiopathic) with inflammation of bilateral lower extremity I89.0 Lymphedema, not elsewhere classified L97.822 Non-pressure chronic ulcer of other part of left lower leg with fat layer exposed L97.812 Non-pressure chronic ulcer of other part of right lower leg with fat layer exposed S90.812D Abrasion, left foot, subsequent  encounter I42.9 Cardiomyopathy, unspecified K92.1 Melena Follow-up Appointments ppointment in 2 weeks. - Thursday 07/31/2020. Return A Dressing Change Frequency Other: - change twice a week. Skin Barriers/Peri-Wound Care Moisturizing lotion - patient to lotion left leg daily. lotion right leg with dressing changes. Wound Cleansing May shower with protection. - use cast protectors on the days dressings are not changed. May shower and wash wound with soap and water. - with dressing changes only. Primary Wound Dressing Wound #4 Right,Distal,Lateral Lower Leg Endoform - moisten with saline. Secondary Dressing Dry Gauze Drawtex - backing the endoform. Edema Control 4 layer compression - Right Lower Extremity - Please ensure to wrap just above the toes and just the below the knee. Avoid standing for long periods of time Elevate legs to the level of the heart or above for 30 minutes daily and/or when sitting, a frequency of: - throughout the day. Exercise regularly Support Garment 20-30 mm/Hg pressure to: - patient to wear compression stocking to left leg. Apply in the morning and remove at night. [facility] for wound care. [facility] home health Electronic Signature(s) Signed: 07/17/2020 5:14:03 PM By: Troy Ham MD Signed: 07/17/2020 5:38:55 PM By: [name] Entered By: [name] on 07/17/2020 08:08:23 -------------------------------------------------------------------------------- Problem List Details Patient Name: Date of Service: Troy [place] 07/17/2020 7:30 [place] Record Number: [id number] Patient Account Number: [id number] Date of Birth/Sex: Treating RN: [birth year] (79 y.o. 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/06/2020 No Yes L97.812 Non-pressure chronic ulcer of other part of right lower leg with fat layer 01/03/2020 No Yes exposed S90.812D Abrasion, left foot, subsequent encounter 02/07/2020 No Yes I42.9 Cardiomyopathy, unspecified 05/15/2020 No Yes K92.1 Melena 05/15/2020 No Yes Inactive Problems ICD-10 Code Description Active Date Inactive Date L97.521 Non-pressure chronic ulcer of other part of left foot limited to breakdown of skin 10/11/2019 10/11/2019 Resolved Problems ICD-10 Code Description Active Date Resolved Date 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 10/11/2019 10/11/2019 Electronic Signature(s) Signed: 07/17/2020 5:14:03 PM By: Troy Ham MD Entered By: [name] on 07/17/2020 08:15:45 -------------------------------------------------------------------------------- Progress Note Details Patient Name: Date of Service: Troy [place] 07/17/2020 7:30 [place] Record Number: [id number] Patient Account Number: [id number] Date of Birth/Sex: Treating RN: 02/14/1941 (  79 y.o. [name], [name] Primary Care Provider: [name]., [name] Other Clinician: Referring Provider: Treating Provider/Extender: Troy Leitz., [name]: 40 Subjective History of Present Illness (HPI) ADMISSION 10/11/2019 This is a 79 year old man with a history of a severe cardiomyopathy with an ejection fraction of about 20%, chronic renal failure stage III. He is listed as a type II diabetic in epic although the patient denies this. He also has a history of PVD. He states for the last month he has had wounds on his bilateral lower extremities that  started off as blisters which denuded. He has areas on the left lateral calf and 2 on the right lateral. He has an area on the left first met head which he did not know was there he we identified this on intake. He has been using Silvadene cream provided by his primary care physician but he is complaining that this burns. 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 He was acutely ill at that time complicating GI bleed. ABIs in our clinic were 1.16 on the right and 1.13 on the left 2/25; the patient comes in with his areas on the left lateral and right lateral calf. There is also an area over the left first MTP bunion deformity. We have been using Sorbact. His edema control is fairly good 3/4; left lateral and right lateral calf. Most of his wounds are in the same position tightly adherent nonviable debris. On the right we debrided the superior wound on the left both wounds. The area on his bunion over the left first MTP medially is I think just about closed. We have been using Sorbact without a lot of success changed to Iodoflex under compression 3/11; left lateral and right lateral calf perhaps minor improvement in the surface condition. We have been using Iodoflex. The area over the bunion of the left first MTP has closed over 3/18; left lateral and right lateral calf not much improvement. We have been using Iodoflex. Aggressive debridement last week. 3/25; left lateral and right lateral calf. We have been using Iodoflex. There is some improvement in the superior area on the right and 2 on the lateral left although there is still a lot of debris  on the surface. The inferior area on the right is still a completely nonviable surface. 4/8; left lateral and right lateral calfs. Essentially mirror-image looking wounds 2 wounds on each side in close juxtaposition we have been using Iodoflex with some improvement in the very adherent fibrinous debris but not a lot. The patient has arterial studies next Wednesday morning and venous studies next Thursday morning. I have been avoiding any further aggressive debridement until we see the arterial study results. His ABIs were fairly good in this clinic and is dorsalis pedis pulses are palpable but the wound beds are pale. 4/15; left lateral and right lateral calfs. Essentially mirror-image wounds with 2 wounds on each side in close juxtaposition but with rims of separating normal tissue. We have been using Iodoflex. The base of the wounds has been cleaning up quite nicely ARTERIAL STUDIES were done showing the patient had an ABI on the right of  1.27 with triphasic waveforms and a TBI of 0.90 on the left triphasic waveforms with an ABI of 1.28 and a TBI of 0.87. No evidence of arterial disease VENOUS REFLUX STUDIES; were done yesterday we do not have this report yet. 4/23; VENOUS REFLUX STUDIES did not show any significant reflux right lower extremity. No evidence of a DVT He did have significant reflux in the common . femoral vein on the left but nothing else was listed as significant. He did not have a DVT. We have been using Iodoflex under compression his wounds are making progress. 4/29; improvements in the wound surface continue. We have been using Iodoflex. He has a 20% out-of-pocket co-pay for Apligraf which is unfortunate. We changed him to Sorbact today 5/6; started on Sorbact last week. Better looking wound surface but not much change in dimensions. 01/24/20-Patient is back at 3 weeks, wound surfaces are about the same but very minimal slough on the right leg wounds, however there is blistering  on both legs adjacent to the wounds, patient denies any other symptoms or new symptoms. His studies have been reviewed and do not indicate any significant arterial or venous disease. But he is attending once in 3 weeks with home health changing in between 6/3; patient has 4 wounds 2 on each side of his lateral lower legs. These wounds are somewhat improved. He apparently arrived in clinic last week with a large blister on the right lateral lower leg that had denuded into the wound. They changed to silver alginate. Still under compression. He has straight Medicare and unfortunately has an unlimited co-pay for advanced Patton options. 6/10; his original 4 wounds are all better especially on the left lateral lower leg. 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 6/24; blister on the right lateral lower leg has healed. There is no additional wounds. The traumatic left second toe is also closed. The 4 that he has are all better 7/1; all of the patient's wounds are smaller except for the proximal one on the left lateral calf. We are using silver collagen 7/22; patient has home health. Relatively refractory wounds on the lateral part of both mid calfs. Each of them 2 wounds. The areas on the left are doing much better in fact the distal 1 looks like it is on his way to closure. The right required debridement with adherent fibrinous debris under illumination. We have been using silver collagen. Previous arterial studies were within normal limits he also had venous reflux studies 04/03/2020 upon evaluation today patient appears to be doing excellent with regard to his  lower extremity ulcers. He is going to require a little bit of debridement on the wounds on the right especially in order to clear away some biofilm and slough but this is minimal and overall he seems to be doing quite well. 8/19; the patient's wounds on the left lateral lower leg just above closed. However the 2 on the lateral part of the right have a nonviable necrotic surface. This is disappointing. There is also no change in dimensions. He has had arterial studies as well as venous reflux studies 8/26; very disappointing today.  Even the more superficial areas on the left are about the same as last week. Still 2 punched-out areas completely unchanged on the right. We have been using silver collagen really making no progress. 9/2; changed to Iodoflex last week better looking wound surfaces especially on the right although they are deep and punched out. The areas on the left are more superficial open 1 of these is almost fully epithelialized although it has been this way for at least 2 weeks. He has a 20% co-pay [Medicare only] unaffordable for a skin substitute. Had some thought about a snap VAC on the deep areas on the right leg we will try to put this through in the insurance 9/9; punched-out wounds on the bilateral lateral lower extremities. We have been operating as if the these were chronic venous wounds with secondary lymphedema. The areas on the left have been doing well in fact one of them is closed over. We have had no improvement in the areas on the right we have been using Iodoflex to help with surface debridement. The areas on the right are deeper wider. I do not see any evidence of infection. Home health is not been putting the wraps on high enough he has localized lymphedema right above the wounds. He has had arterial and venous studies already 9/16; punched-out mirror-image wounds on his bilateral lateral mid calfs. We have 1 closed on the left lateral the other smaller. For the first  time today the areas on the right lateral actually looks some better. I did a biopsy of 1 of these last time although we still do not have this result. HOWEVER he arrives in clinic today complaining of chest pain overnight which felt like a brick on his chest. He also had 2 black watery bowel movements. He does not have nausea or vomiting. He is not describing abdominal pain. He has no prior history of diarrhea heartburn etc. 9/30; sent this patient to the ER on 9/16. 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 He is on twice daily PPIs Protonix. His admission hemoglobin was exceptionally low at 5. 8.. Discharged at 8.1. 4 unit transfusion. 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 His diuretic dose was actually decreased because of elevated creatinine currently at torsemide 20 mg not felt to be a Coumadin candidate. Patient states he feels well. The areas on his left leg are healed. The areas on the right look better. These have been very refractory wounds. He does not have stockings we will make arrangements for this today 10/7; the areas on the left leg are healed we will transition him into his 20/30 stocking today. Continued improvement in the areas on the right lateral leg 10/21; the areas on the left leg remain healed at this 2-week follow-up however there is a history that he developed some drainage from one of the wound sites with home health therefore going ahead and wrapping him. He was supposed to be on 20/30 stockings although the history here is vague and I am not sure that this represents a stocking failure or not. We have been wrapping his right leg with the one remaining wound is the distal wound. We have been using silver collagen 10/28 the patient only has 1 wound remaining on the right lower calf. This still has 3 mm of depth which is unchanged but the surface area is smaller.  The superior wound  on the right lateral is closed. The 2 wounds on the left remain closed and he is using his compression stocking 11/4 1 remaining wound on the right lower calf. Under illumination not a viable surface we have been using silver collagen we had good effect on the other areas but this 1 appears to be stalled. There is no open area on the left leg he is using his own stocking. 11/11; 1 remaining wound on the right lower calf. Surface of this is a lot better than last week but still requiring debridement I am using Iodoflex but looking forward to changing the primary dressing to perhaps endoform 11/18; 1 remaining wound on the right lower leg, venous insufficiency, vigorous debridement last week/Iodoflex. Depth of the wound is therefore larger but the wound looks clean. Still very gritty material at the surface requiring debridement Objective Constitutional Sitting or standing Blood Pressure is within target range for patient.. Pulse regular and within target range for patient.Marland Kitchen Respirations regular, non-labored and within target range.. Temperature is normal and within the target range for the patient.Marland Kitchen Appears in no distress. Vitals Time Taken: 7:51 AM, Height: 71 in, Weight: 198 lbs, BMI: 27.6, Temperature: 97.7 F, Pulse: 85 bpm, Respiratory Rate: 18 breaths/min, Blood Pressure: 113/63 mmHg. General Notes: Wound exam; right lateral lower leg. The wound is deep however under surface illumination the wound bed looks satisfactory. I used a #5 curette still a small amount of gritty surface debris hemostasis with direct pressure. Subcutaneous debridement. Edema control is good pedal pulses palpable Integumentary (Hair, Skin) Wound #4 status is Open. Original cause of wound was Blister. The wound is located on the Right,Distal,Lateral Lower Leg. The wound measures 2.5cm length x 2cm width x 1.2cm depth; 3.927cm^2 area and 4.712cm^3 volume. There is Fat Layer (Subcutaneous Tissue)  exposed. There is no tunneling or undermining noted. There is a medium amount of serosanguineous drainage noted. The wound margin is well defined and not attached to the wound base. There is large (67- 100%) pink granulation within the wound bed. There is a small (1-33%) amount of necrotic tissue within the wound bed including Adherent Slough. Assessment Active Problems ICD-10 Chronic venous hypertension (idiopathic) with inflammation of bilateral lower extremity Lymphedema, not elsewhere classified Non-pressure chronic ulcer of other part of left lower leg with fat layer exposed Non-pressure chronic ulcer of other part of right lower leg with fat layer exposed Abrasion, left foot, subsequent encounter Cardiomyopathy, unspecified Melena Procedures Wound #4 Pre-procedure diagnosis of Wound #4 is a Venous Leg Ulcer located on the Right,Distal,Lateral Lower Leg .Severity of Tissue Pre Debridement is: Fat layer exposed. There was a Excisional Skin/Subcutaneous Tissue Debridement with a total area of 5 sq cm performed by Troy Dillon., MD. With the following instrument(s): Curette to remove Viable and Non-Viable tissue/material. Material removed includes Subcutaneous Tissue, Skin: Dermis, and Fibrin/Exudate after achieving pain control using Lidocaine 4% Topical Solution. A time out was conducted at 08:05, prior to the start of the procedure. A Moderate amount of bleeding was controlled with Pressure. The procedure was tolerated well with a pain level of 0 throughout and a pain level of 0 following the procedure. Post Debridement Measurements: 2.5cm length x 2cm width x 1.2cm depth; 4.712cm^3 volume. Character of Wound/Ulcer Post Debridement is improved. Severity of Tissue Post Debridement is: Fat layer exposed. 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 There was a Four  Layer Compression Therapy Procedure with a pre-Patton ABI of 1.2 by Baruch Gouty, RN. Post procedure Diagnosis Wound #4: Same as Pre-Procedure Plan Follow-up Appointments: Return Appointment in 2 weeks. - Thursday 07/31/2020. Dressing Change Frequency: Other: - change twice a week. Skin Barriers/Peri-Wound Care: Moisturizing lotion - patient to lotion left leg daily. lotion right leg with dressing changes. Wound Cleansing: May shower with protection. - use cast protectors on the days dressings are not changed. May shower and wash wound with soap and water. - with dressing changes only. Primary Wound Dressing: Wound #4 Right,Distal,Lateral Lower Leg: Endoform - moisten with saline. Secondary Dressing: Dry Gauze Drawtex - backing the endoform. Edema Control: 4 layer compression - Right Lower Extremity - Please ensure to wrap just above the toes and just the below the knee. Avoid standing for long periods of time Elevate legs to the level of the heart or above for 30 minutes daily and/or when sitting, a frequency of: - throughout the day. Exercise regularly Support Garment 20-30 mm/Hg pressure to: - patient to wear compression stocking to left leg. Apply in the morning and remove at night. Home Health: [facility] for wound care. - [facility] we going to use endoform back by drawtex still under 4-layer compression. Have home health change this x2 next week and will see him in 2 weeks 2. I think he had an unacceptable co-pay for an advanced Patton option otherwise that is the route I would like to go Electronic Signature(s) Signed: 07/17/2020 5:14:03 PM By: Troy Ham MD Entered By: [name] on 07/17/2020 08:19:15 -------------------------------------------------------------------------------- SuperBill Details Patient Name: Date of Service: Troy Mila Doce Lucas Coffee 07/17/2020 Medical Record Number: [id number] Patient Account  Number: [id number] Date of Birth/Sex: Treating RN: 1940/10/03 (78 y.o. [name], [name] Primary Care Provider: [name]., [name] Other Clinician: Referring Provider: Treating Provider/Extender: Troy Leitz., [name]: 42 Diagnosis Coding ICD-10 Codes Code Description [id number] Chronic venous hypertension (idiopathic) with inflammation of bilateral lower extremity I89.0 Lymphedema, not elsewhere classified L97.822 Non-pressure chronic ulcer of other part of left lower leg with fat layer exposed L97.812 Non-pressure chronic ulcer of other part of right lower leg with fat layer exposed S90.812D Abrasion, left foot, subsequent encounter I42.9 Cardiomyopathy, unspecified K92.1 [facility]ena Facility Procedures The patient participates with Medicare or their insurance follows the Medicare Facility Guidelines: CPT4 Code Description Modifier Quantity [id number] 11042 - DEB SUBQ TISSUE 20 SQ CM/< 1 ICD-10 Diagnosis Description Q98.264 Non-pressure chronic ulcer of  other part of right lower leg with fat layer exposed Physician Procedures : CPT4 Code Description Modifier [id number] 11042 - WC PHYS SUBQ TISS 20 SQ CM ICD-10 Diagnosis Description M76.808 Non-pressure chronic ulcer of other part of right lower leg with fat layer exposed Quantity: 1 Electronic Signature(s) Signed: 07/17/2020 5:14:03 PM By: Troy Ham MD Entered By: [name] on 07/17/2020 08:19:25

## 2020-07-17 NOTE — Progress Notes (Signed)
DEMETRICK, EICHENBERGER ([id number]) Visit Report for 07/17/2020 Arrival Information Details Patient Name: Date of Service: [place] 07/17/2020 7:30 A M Medical Record Number: [id number] Patient Account Number: [id number] Date of Birth/Sex: Treating RN: [birth year] (79 y.o. [name]) [name] Primary Care Therman Hughlett: [name]., [name] Other Clinician: Referring Arval Brandstetter: Treating Shivaan Tierno/Extender: [name], [name] in Treatment: 85 Visit Information History Since Last Visit All ordered tests and consults were completed: No Patient Arrived: Ambulatory Added or deleted any medications: No Arrival Time: 07:51 Any new allergies or adverse reactions: No Accompanied By: self Had a fall or experienced change in No Transfer Assistance: None activities of daily living that may affect Patient Identification Verified: Yes risk of falls: Secondary Verification Process Completed: Yes Signs or symptoms of abuse/neglect since last visito No Patient Requires Transmission-Based Precautions: No Hospitalized since last visit: No Patient Has Alerts: Yes Implantable device outside of the clinic excluding No Patient Alerts: Patient on Blood Thinner cellular tissue based products placed in the center since last visit: Has Dressing in Place as Prescribed: Yes Pain Present Now: No Electronic Signature(s) Signed: 07/17/2020 5:30:22 PM By: [name] RN Entered By: [name] on 07/17/2020 07:51:40 -------------------------------------------------------------------------------- Compression Therapy Details Patient Name: Date of Service: [name] [facility] 07/17/2020 7:30 [place] Record Number: [id number] Patient Account Number: [id number] Date of Birth/Sex: Treating RN: 09-13-1940 (79 y.o. [name] Kyrian Stage: [name]., [name] Other Clinician: Referring Mariapaula Krist: Treating Yatzary Merriweather/Extender: [name], [name] in  Treatment: 40 Compression Therapy Performed for Wound Assessment: Wound #4 Right,Distal,Lateral Lower Leg Performed By: Clinician Baruch Gouty, RN Compression Type: Four Layer Pre Treatment ABI: 1.2 Post Procedure Diagnosis Same as Pre-procedure Electronic Signature(s) Signed: 07/17/2020 5:38:55 PM By: Deon Pilling Entered By: Deon Pilling on 07/17/2020 08:06:32 -------------------------------------------------------------------------------- Encounter Discharge Information Details Patient Name: Date of Service: [place] 07/17/2020 7:30 [place] Record Number: [id number] Patient Account Number: [id number] Date of Birth/Sex: Treating RN: May 28, 1941 (79 y.o. [name] Primary Care Jamond Neels: [name]., [name] Other Clinician: Referring Finneus Kaneshiro: Treating Reiley Bertagnolli/Extender: [name], [name] in Treatment: 14 Encounter Discharge Information Items Post Procedure Vitals Discharge Condition: Stable Temperature (F): 97.9 Ambulatory Status: Cane Pulse (bpm): 85 Discharge Destination: Home Respiratory Rate (breaths/min): 18 Transportation: Private Auto Blood Pressure (mmHg): 113/63 Accompanied By: self Schedule Follow-up Appointment: Yes Clinical Summary of Care: Patient Declined Electronic Signature(s) Signed: 07/17/2020 2:58:18 PM By: Baruch Gouty RN, BSN Entered By: Baruch Gouty on 07/17/2020 08:37:09 -------------------------------------------------------------------------------- Lower Extremity Assessment Details Patient Name: Date of Service: [place] 07/17/2020 7:30 [place] Record Number: [id number] Patient Account Number: [id number] Date of Birth/Sex: Treating RN: 06-08-1941 (79 y.o. [name]) [name] Primary Care Bernice Mcauliffe: [name]., [name] Other Clinician: Referring Lakendrick Paradis: Treating Benjy Kana/Extender: [name], [name] in Treatment: 40 Edema  Assessment Assessed: [Left: No] [Right: No] Edema: [Left: Yes] [Right: Yes] Calf Left: Right: Point of Measurement: 53 cm From Medial Instep 40 cm Ankle Left: Right: Point of Measurement: 14 cm From Medial Instep 23 cm Electronic Signature(s) Signed: 07/17/2020 5:30:22 PM By: [name] RN Entered By: [name] on 07/17/2020 07:59:49 -------------------------------------------------------------------------------- Multi Wound Chart Details Patient Name: Date of Service: [place] 07/17/2020 7:30 [place] Record Number: [id number] Patient Account Number: [id number] Date of  Birth/Sex: Treating RN: 1940-12-18 (79 y.o. [name], [name] Primary Care Jaquese Irving: [name]., [name] Other Clinician: Referring Minh Roanhorse: Treating Isaac Dubie/Extender: [name], [name] in Treatment: 40 Vital Signs Height(in): 71 Pulse(bpm): 85 Weight(lbs): 198 Blood Pressure(mmHg): 113/63 Body Mass Index(BMI): 28 Temperature(F): 97.7 Respiratory Rate(breaths/min): 18 Photos: [4:No Photos Right, Distal, Lateral Lower Leg] [N/A:N/A N/A] Wound Location: [4:Blister] [N/A:N/A] Wounding Event: [4:Venous Leg Ulcer] [N/A:N/A] Primary Etiology: [4:Anemia, Congestive Heart Failure,] [N/A:N/A] Comorbid History: [4:Hypertension, Peripheral Venous Disease, 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] [N/A:N/A] Date Acquired: [4:40] [N/A:N/A] Weeks of Treatment: [4:Open] [N/A:N/A] Wound Status: [4:2.5x2x1.2] [N/A:N/A] Measurements L x W x D (cm) [4:3.927] [N/A:N/A] A (cm) : rea [4:4.712] [N/A:N/A] Volume (cm) : [4:57.80%] [N/A:N/A] % Reduction in A [4:rea: -406.70%] [N/A:N/A] % Reduction in Volume: [4:Full Thickness Without Exposed] [N/A:N/A] Classification: [4:Support Structures Medium] [N/A:N/A] Exudate A mount: [4:Serosanguineous] [N/A:N/A] Exudate Type: [4:red, brown] [N/A:N/A] Exudate Color: [4:Well defined, not attached] [N/A:N/A] Wound Margin: [4:Large (67-100%)]  [N/A:N/A] Granulation A mount: [4:Pink] [N/A:N/A] Granulation Quality: [4:Small (1-33%)] [N/A:N/A] Necrotic A mount: [4:Fat Layer (Subcutaneous Tissue): Yes N/A] Exposed Structures: [4:Fascia: No Tendon: No Muscle: No Joint: No Bone: No Small (1-33%)] [N/A:N/A] Epithelialization: [4:Debridement - Excisional] [N/A:N/A] Debridement: Pre-procedure Verification/Time Out 08:05 [N/A:N/A] Taken: [4:Lidocaine 4% Topical Solution] [N/A:N/A] Pain Control: [4:Subcutaneous] [N/A:N/A] Tissue Debrided: [4:Skin/Subcutaneous Tissue] [N/A:N/A] Level: [4:5] [N/A:N/A] Debridement A (sq cm): [4:rea Curette] [N/A:N/A] Instrument: [4:Moderate] [N/A:N/A] Bleeding: [4:Pressure] [N/A:N/A] Hemostasis A chieved: [4:0] [N/A:N/A] Procedural Pain: [4:0] [N/A:N/A] Post Procedural Pain: [4:Procedure was tolerated well] [N/A:N/A] Debridement Treatment Response: [4:2.5x2x1.2] [N/A:N/A] Post Debridement Measurements L x W x D (cm) [4:4.712] [N/A:N/A] Post Debridement Volume: (cm) [4:Compression Therapy] [N/A:N/A] Procedures Performed: [4:Debridement] Treatment Notes Electronic Signature(s) Signed: 07/17/2020 5:14:03 PM By: Linton Ham MD Signed: 07/17/2020 5:38:55 PM By: Deon Pilling Entered By: Linton Ham on 07/17/2020 08:15:51 -------------------------------------------------------------------------------- Multi-Disciplinary Care Plan Details Patient Name: Date of Service: [place] [employer] [place] 07/17/2020 7:30 A M Medical Record Number: [id number] Patient Account Number: [id number] Date of Birth/Sex: Treating RN: Nov 27, 1940 (79 y.o. [name], [name] Primary Care Tihanna Goodson: [name]., [name] Other Clinician: Referring Baeleigh Devincent: Treating Titania Gault/Extender: [name], [name] in Treatment: 80 Active Inactive Venous Leg Ulcer Nursing Diagnoses: Knowledge deficit related to disease process and management Potential for venous Insuffiency (use before diagnosis  confirmed) Goals: Patient will maintain optimal edema control Date Initiated: 05/01/2020 Target Resolution Date: 08/29/2020 Goal Status: Active Interventions: Assess peripheral edema status every visit. Compression as ordered Provide education on venous insufficiency Notes: Electronic Signature(s) Signed: 07/17/2020 5:38:55 PM By: Deon Pilling Entered By: Deon Pilling on 07/17/2020 08:02:31 -------------------------------------------------------------------------------- Pain Assessment Details Patient Name: Date of Service: [name] [facility] 07/17/2020 7:30 [place] Record Number: [id number] Patient Account Number: [id number] Date of Birth/Sex: Treating RN: 09-28-1940 (79 y.o. [name]) [name] Primary Care Tarisha Fader: [name]., [name] Other Clinician: Referring Derwin Reddy: Treating Treena Cosman/Extender: [name], [name] in Treatment: 40 Active Problems Location of Pain Severity and Description of Pain Patient Has Paino No Site Locations Pain Management and Medication Current Pain Management: Electronic Signature(s) Signed: 07/17/2020 5:30:22 PM By: [name] RN Entered By: [name] on 07/17/2020 07:52:09 -------------------------------------------------------------------------------- Patient/Caregiver Education Details Patient Name: Date of Service: [place] [address] [place] 11/18/2021andnbsp7:30 [place] Record Number: [id number] Patient Account Number: [id number] Date of Birth/Gender: Treating RN: 1940/11/05 (79 y.o. [name] Physician: [name]., [name]  Other Clinician: Referring Physician: Treating Physician/Extender: [name], [name] in Treatment: 62 Education Assessment Education Provided To: Patient Education Topics Provided Venous: Handouts: Managing Venous Disease and Related Ulcers Methods: Explain/Verbal Responses: Reinforcements needed Electronic  Signature(s) Signed: 07/17/2020 5:38:55 PM By: Deon Pilling Entered By: Deon Pilling on 07/17/2020 08:05:17 -------------------------------------------------------------------------------- Wound Assessment Details Patient Name: Date of Service: [place] [name] [facility] 07/17/2020 7:30 A M Medical Record Number: [id number] Patient Account Number: [id number] Date of Birth/Sex: Treating RN: 1940-11-25 (79 y.o. [name]) [name] Primary Care Tanaysha Alkins: [name]., [name] Other Clinician: Referring Khai Arrona: Treating Sueellen Kayes/Extender: [name], [name] in Treatment: 40 Wound Status Wound Number: 4 Primary Venous Leg Ulcer Etiology: Wound Location: Right, Distal, Lateral Lower Leg Wound Open Wounding Event: Blister Status: Date Acquired: 09/11/2019 Comorbid Anemia, Congestive Heart Failure, Hypertension, Peripheral Weeks Of Treatment: 40 History: Venous Disease, End Stage Renal Disease Clustered Wound: No Wound Measurements Length: (cm) 2.5 Width: (cm) 2 Depth: (cm) 1.2 Area: (cm) 3.927 Volume: (cm) 4.712 % Reduction in Area: 57.8% % Reduction in Volume: -406.7% Epithelialization: Small (1-33%) Tunneling: No Undermining: No Wound Description Classification: Full Thickness Without Exposed Support Structures Wound Margin: Well defined, not attached Exudate Amount: Medium Exudate Type: Serosanguineous Exudate Color: red, brown Foul Odor After Cleansing: No Slough/Fibrino Yes Wound Bed Granulation Amount: Large (67-100%) Exposed Structure Granulation Quality: Pink Fascia Exposed: No Necrotic Amount: Small (1-33%) Fat Layer (Subcutaneous Tissue) Exposed: Yes Necrotic Quality: Adherent Slough Tendon Exposed: No Muscle Exposed: No Joint Exposed: No Bone Exposed: No Treatment Notes Wound #4 (Right, Distal, Lateral Lower Leg) 2. Periwound Care Moisturizing lotion 3. Primary Dressing Applied Endoform 4. Secondary Dressing Dry  Gauze Drawtex 6. Support Layer Applied 4 layer compression Water quality scientist) Signed: 07/17/2020 5:30:22 PM By: [name] RN Entered By: [name] on 07/17/2020 08:00:19 -------------------------------------------------------------------------------- Vitals Details Patient Name: Date of Service: [place] 07/17/2020 7:30 [place] Number: [id number] Patient Account Number: [id number] Date of Birth/Sex: Treating RN: May 14, 1941 (79 y.o. [name]) [name] Primary Care Mckynleigh Mussell: [name]., [name] Other Clinician: Referring Luva Metzger: Treating Tane Biegler/Extender: [name], [name] in Treatment: 40 Vital Signs Time Taken: 07:51 Temperature (F): 97.7 Height (in): 71 Pulse (bpm): 85 Weight (lbs): 198 Respiratory Rate (breaths/min): 18 Body Mass Index (BMI): 27.6 Blood Pressure (mmHg): 113/63 Reference Range: 80 - 120 mg / dl Electronic Signature(s) Signed: 07/17/2020 5:30:22 PM By: [name] RN Entered By: [name] on 07/17/2020 07:52:00

## 2020-07-31 ENCOUNTER — Other Ambulatory Visit: Payer: Self-pay

## 2020-07-31 ENCOUNTER — Encounter (HOSPITAL_BASED_OUTPATIENT_CLINIC_OR_DEPARTMENT_OTHER): Payer: Medicare Other | Attending: Internal Medicine | Admitting: Internal Medicine

## 2020-07-31 DIAGNOSIS — L97822 Non-pressure chronic ulcer of other part of left lower leg with fat layer exposed: Secondary | ICD-10-CM | POA: Diagnosis not present

## 2020-07-31 DIAGNOSIS — L97812 Non-pressure chronic ulcer of other part of right lower leg with fat layer exposed: Secondary | ICD-10-CM | POA: Diagnosis not present

## 2020-07-31 DIAGNOSIS — S90812A Abrasion, left foot, initial encounter: Secondary | ICD-10-CM | POA: Insufficient documentation

## 2020-07-31 DIAGNOSIS — E1122 Type 2 diabetes mellitus with diabetic chronic kidney disease: Secondary | ICD-10-CM | POA: Diagnosis not present

## 2020-07-31 DIAGNOSIS — I890 Lymphedema, not elsewhere classified: Secondary | ICD-10-CM | POA: Insufficient documentation

## 2020-07-31 DIAGNOSIS — E1151 Type 2 diabetes mellitus with diabetic peripheral angiopathy without gangrene: Secondary | ICD-10-CM | POA: Insufficient documentation

## 2020-07-31 DIAGNOSIS — I87323 Chronic venous hypertension (idiopathic) with inflammation of bilateral lower extremity: Secondary | ICD-10-CM | POA: Insufficient documentation

## 2020-07-31 DIAGNOSIS — X58XXXA Exposure to other specified factors, initial encounter: Secondary | ICD-10-CM | POA: Insufficient documentation

## 2020-07-31 DIAGNOSIS — I429 Cardiomyopathy, unspecified: Secondary | ICD-10-CM | POA: Diagnosis not present

## 2020-07-31 NOTE — Progress Notes (Signed)
[name], [name] ([id number]) Visit Report for 07/31/2020 HPI Details Patient Name: Date of Service: [place] 07/31/2020 7:30 A M Medical Record Number: [id number] Patient Account Number: [id number] Date of Birth/Sex: Treating RN: October 04, 1940 (79 y.o. [name], [name] Primary Care Provider: [name]., [name] Other Clinician: Referring Provider: Treating Provider/Extender: [name], [name] in Treatment: 15 History of Present Illness HPI Description: ADMISSION 10/11/2019 This is a 79 year old man with a history of a severe cardiomyopathy with an ejection fraction of about 20%, chronic renal failure stage III. He is listed as a type II diabetic in epic although the patient denies this. He also has a history of PVD. He states for the last month he has had wounds on his bilateral lower extremities that started off as blisters which denuded. He has areas on the left lateral calf and 2 on the right lateral. He has an area on the left first met head which he did not know was there he we identified this on intake. He has been using Silvadene cream provided by his primary care physician but he is complaining that this burns. 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 He was acutely ill at that time complicating GI bleed. ABIs in our clinic were 1.16 on the right and 1.13 on the left 2/25; the patient comes in with his areas on the left lateral and right lateral calf. There is also an area over the left first MTP bunion deformity. We have been using Sorbact. His edema control is fairly good 3/4; left lateral and  right lateral calf. Most of his wounds are in the same position tightly adherent nonviable debris. On the right we debrided the superior wound on the left both wounds. The area on his bunion over the left first MTP medially is I think just about closed. We have been using Sorbact without a lot of success changed to Iodoflex under compression 3/11; left lateral and right lateral calf perhaps minor improvement in the surface condition. We have been using Iodoflex. The area over the bunion of the left first MTP has closed over 3/18; left lateral and right lateral calf not much improvement. We have been using Iodoflex. Aggressive debridement last week. 3/25; left lateral and right lateral calf. We have been using Iodoflex. There is some improvement in the superior area on the right and 2 on the lateral left although there is still a lot of debris on the surface. The inferior area on the right is still a completely nonviable surface. 4/8; left lateral and right lateral calfs. Essentially mirror-image looking wounds 2 wounds on each side in close juxtaposition we have been using Iodoflex with some improvement in the very adherent fibrinous debris but not a lot. The patient has arterial studies next Wednesday morning and venous studies next Thursday morning. I have been avoiding any further aggressive debridement until we see the arterial study results. His ABIs were fairly good in this clinic and is dorsalis pedis pulses are palpable but the wound beds are pale. 4/15; left lateral and right lateral calfs. Essentially mirror-image wounds with 2 wounds on each side in close juxtaposition  but with rims of separating normal tissue. We have been using Iodoflex. The base of the wounds has been cleaning up quite nicely ARTERIAL STUDIES were done showing the patient had an ABI on the right of 1.27 with triphasic waveforms and a TBI of 0.90 on the left triphasic waveforms with an ABI of 1.28 and a TBI of 0.87. No  evidence of arterial disease VENOUS REFLUX STUDIES; were done yesterday we do not have this report yet. 4/23; VENOUS REFLUX STUDIES did not show any significant reflux right lower extremity. No evidence of a DVT He did have significant reflux in the common . femoral vein on the left but nothing else was listed as significant. He did not have a DVT. We have been using Iodoflex under compression his wounds are making progress. 4/29; improvements in the wound surface continue. We have been using Iodoflex. He has a 20% out-of-pocket co-pay for Apligraf which is unfortunate. We changed him to Sorbact today 5/6; started on Sorbact last week. Better looking wound surface but not much change in dimensions. 01/24/20-Patient is back at 3 weeks, wound surfaces are about the same but very minimal slough on the right leg wounds, however there is blistering on both legs adjacent to the wounds, patient denies any other symptoms or new symptoms. His studies have been reviewed and do not indicate any significant arterial or venous disease. But he is attending once in 3 weeks with home health changing in between 6/3; patient has 4 wounds 2 on each side of his lateral lower legs. These wounds are somewhat improved. He apparently arrived in clinic last week with a large blister on the right lateral lower leg that had denuded into the wound. They changed to silver alginate. Still under compression. He has straight Medicare and unfortunately has an unlimited co-pay for advanced treatment options. 6/10; his original 4 wounds are all better especially on the left lateral lower leg. 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 6/24; blister on the right lateral lower leg has healed. There is no additional wounds. The traumatic left second toe is also closed. The 4 that he has are all better 7/1; all of the patient's wounds are smaller except for the proximal one on the left lateral calf. We are using silver collagen 7/22; patient has home health. Relatively refractory wounds on the lateral part of both mid calfs. Each of them 2 wounds. The areas on the left are doing much better in fact the distal 1 looks like it is on his way to closure. The right required debridement with adherent fibrinous debris under illumination. We have been using silver collagen. Previous arterial studies were within normal limits he also had venous reflux studies 04/03/2020 upon evaluation today patient appears to be doing excellent with regard to his lower extremity ulcers. He is going to require a little bit of debridement on the wounds on the right especially in order to clear away some biofilm and slough but this is minimal and overall he seems to be doing quite well. 8/19; the patient's wounds on the left lateral lower leg just above closed. However the 2  on the lateral part of the right have a nonviable necrotic surface. This is disappointing. There is also no change in dimensions. He has had arterial studies as well as venous reflux studies 8/26; very disappointing today. Even the more superficial areas on the left are about the same as last week. Still 2 punched-out areas completely unchanged on the right. We have been using silver collagen really making no progress. 9/2; changed to Iodoflex last week better looking wound surfaces especially on the right although they are deep and punched out. The areas on the left are more superficial open 1 of these is almost fully epithelialized although it has been this way for at least 2 weeks. He has a 20% co-pay [Medicare only] unaffordable for a skin  substitute. Had some thought about a snap VAC on the deep areas on the right leg we will try to put this through in the insurance 9/9; punched-out wounds on the bilateral lateral lower extremities. We have been operating as if the these were chronic venous wounds with secondary lymphedema. The areas on the left have been doing well in fact one of them is closed over. We have had no improvement in the areas on the right we have been using Iodoflex to help with surface debridement. The areas on the right are deeper wider. I do not see any evidence of infection. Home health is not been putting the wraps on high enough he has localized lymphedema right above the wounds. He has had arterial and venous studies already 9/16; punched-out mirror-image wounds on his bilateral lateral mid calfs. We have 1 closed on the left lateral the other smaller. For the first time today the areas on the right lateral actually looks some better. I did a biopsy of 1 of these last time although we still do not have this result. HOWEVER he arrives in clinic today complaining of chest pain overnight which felt like a brick on his chest. He also had 2 black watery bowel movements. He does not have nausea or vomiting. He is not describing abdominal pain. He has no prior history of diarrhea heartburn etc. 9/30; sent this patient to the ER on 9/16. 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 He is on twice daily PPIs Protonix. His admission hemoglobin was exceptionally low at 5. 8.. Discharged at 8.1. 4 unit transfusion. 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 His diuretic dose was actually decreased because of elevated creatinine currently at torsemide 20 mg not felt to be a Coumadin candidate. Patient states he feels well. The areas on his left leg are healed. The areas on the right look better. These have been very refractory  wounds. He does not have stockings we will make arrangements for this today 10/7; the areas on the left leg are healed we will transition him into his 20/30 stocking today. Continued improvement in the areas on the right lateral leg 10/21; the areas on the left leg remain healed at this 2-week follow-up however there is a history that he developed some drainage from one of the wound sites with home health therefore going ahead and wrapping him. He was supposed to be on 20/30 stockings although the history here is vague and I am not sure that this represents a stocking failure or not. We have been wrapping his right leg with the one remaining wound is the distal wound. We  have been using silver collagen 10/28 the patient only has 1 wound remaining on the right lower calf. This still has 3 mm of depth which is unchanged but the surface area is smaller. The superior wound on the right lateral is closed. The 2 wounds on the left remain closed and he is using his compression stocking 11/4 1 remaining wound on the right lower calf. Under illumination not a viable surface we have been using silver collagen we had good effect on the other areas but this 1 appears to be stalled. There is no open area on the left leg he is using his own stocking. 11/11; 1 remaining wound on the right lower calf. Surface of this is a lot better than last week but still requiring debridement I am using Iodoflex but looking forward to changing the primary dressing to perhaps endoform 11/18; 1 remaining wound on the right lower leg, venous insufficiency, vigorous debridement last week/Iodoflex. Depth of the wound is therefore larger but the wound looks clean. Still very gritty material at the surface requiring debridement 12/2; the remaining wound on his right lower leg, venous insufficiency. I changed him to endoform 2 weeks ago. We appear to be making nice progress. Wound is measuring smaller Electronic Signature(s) Signed:  07/31/2020 12:52:04 PM By: Baltazar Najjar MD Entered By: Baltazar Najjar on 07/31/2020 08:25:29 -------------------------------------------------------------------------------- Physical Exam Details Patient Name: Date of Service: [place] [facility] 07/31/2020 7:30 A M Medical Record Number: [id number] Patient Account Number: [id number] Date of Birth/Sex: Treating RN: 1940-09-07 (79 y.o. Harlon Flor, Millard.Loa Primary Care Provider: Marcelline Mates., [name] Other Clinician: Referring Provider: Treating Provider/Extender: [name] Blase., [name] in Treatment: 59 Constitutional Sitting or standing Blood Pressure is within target range for patient.. Pulse regular and within target range for patient.Marland Kitchen Respirations regular, non-labored and within target range.. Temperature is normal and within the target range for the patient.Marland Kitchen Appears in no distress. Cardiovascular Pedal pulses are palpable. Notes Wound exam; right lateral lower leg; this is the 1 remaining area of the 4 he had 2 on the right lateral and 2 on the left lateral. Under illumination the tissue looks healthy here. There is advancing epithelialization. We have good edema control. No evidence of surrounding infection Electronic Signature(s) Signed: 07/31/2020 12:52:04 PM By: Baltazar Najjar MD Entered By: Baltazar Najjar on 07/31/2020 08:26:41 -------------------------------------------------------------------------------- Physician Orders Details Patient Name: Date of Service: [place] RRA [place] [facility] WERS 07/31/2020 7:30 A M Medical Record Number: [id number] Patient Account Number: [id number] Date of Birth/Sex: Treating RN: [birth year] (79 y.o. 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 Bathing/ Shower/ Hygiene May shower with protection but do not get wound dressing(s) wet. May shower and wash wound with soap and water. - with dressing changes only. Edema Control - Lymphedema / SCD / Other Elevate legs to the level of the heart or above for 30 minutes daily and/or when sitting,  a frequency of: - 3-4 times a day throughout the day. Avoid standing for long periods of time. Exercise regularly Other Edema Control Orders/Instructions: - wear compression stocking to left leg daily. apply in the morning and remove at night. ensure to apply lotion nightly to left leg. Home Health No change in wound care orders this week; continue Home Health for wound care. May utilize formulary equivalent dressing for wound treatment orders unless otherwise specified. - [facility] home health. Wound Treatment Wound #4 - Lower Leg Wound Laterality: Right, Lateral, Distal Cleanser: Soap and Water (Home Health) 2 x Per Week/15 Days Discharge Instructions: May shower and wash wound with dial antibacterial soap and water with dressing changes only. Peri-Wound Care: Sween Lotion (Moisturizing lotion) (Home Health) 2 x Per Week/15 Days Discharge Instructions: Apply moisturizing lotion as directed Prim Dressing: Endoform 2x2 in (Home Health) 2 x Per Week/15 Days ary Discharge Instructions: moisten with saline. Secondary Dressing: Woven Gauze Sponge, Non-Sterile 4x4 in (Home Health) 2 x Per Week/15 Days Secondary Dressing: Drawtex 4x4 in (Home Health) 2  x Per Week/15 Days Discharge Instructions: Apply over endoform. Compression Wrap: FourPress (4 layer compression wrap) (Home Health) 2 x Per Week/15 Days Discharge Instructions: Apply four layer compression just above toes and just below the knee. Electronic Signature(s) Signed: 07/31/2020 12:52:04 PM By: Linton Ham MD Signed: 07/31/2020 5:54:37 PM By: Deon Pilling Entered By: Deon Pilling on 07/31/2020 10:30:51 -------------------------------------------------------------------------------- Problem List Details Patient Name: Date of Service: [place] 07/31/2020 7:30 [place] Record Number: [id number] Patient Account Number: [id number] Date of Birth/Sex: Treating RN: 07/28/1941 (79 y.o. 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/06/2020 No Yes L97.812 Non-pressure chronic ulcer of other part of right lower leg with fat layer 01/03/2020 No Yes exposed S90.812D Abrasion, left foot, subsequent encounter 02/07/2020 No Yes I42.9 Cardiomyopathy, unspecified 05/15/2020 No Yes K92.1 Melena 05/15/2020 No Yes Inactive Problems ICD-10 Code Description Active Date Inactive Date L97.521 Non-pressure chronic ulcer of other part of left foot limited to breakdown of skin 10/11/2019 10/11/2019 Resolved Problems ICD-10 Code Description Active Date Resolved Date 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 10/11/2019 10/11/2019 Electronic Signature(s) Signed: 07/31/2020 12:52:04  PM By: Linton Ham MD Entered By: Linton Ham on 07/31/2020 08:24:16 -------------------------------------------------------------------------------- Progress Note Details Patient Name: Date of Service: [place] 07/31/2020 7:30 [place] Record Number: [id number] Patient Account Number: [id number] Date of Birth/Sex: Treating RN: 1941/01/25 (79 y.o. [name], [name] Primary Care Provider: [name]., [name] Other Clinician: Referring Provider: Treating Provider/Extender: [name], [name] in Treatment: 50 Subjective History of Present Illness (HPI) ADMISSION 10/11/2019 This is a 79 year old man with a history of a severe cardiomyopathy with an ejection fraction of about 20%, chronic renal failure stage III. He is listed as a type II diabetic in epic although the patient denies this. He also has a history of PVD. He states for the last month he has had wounds on his bilateral lower extremities that started off as blisters which denuded. He has  areas on the left lateral calf and 2 on the right lateral. He has an area on the left first met head which he did not know was there he we identified this on intake. He has been using Silvadene cream provided by his primary care physician but he is complaining that this burns. 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 He was acutely ill at that time complicating GI bleed. ABIs in our clinic were 1.16 on the right and 1.13 on the left 2/25; the patient comes in with his areas on the left lateral and right lateral calf. There is also an area over the left  first MTP bunion deformity. We have been using Sorbact. His edema control is fairly good 3/4; left lateral and right lateral calf. Most of his wounds are in the same position tightly adherent nonviable debris. On the right we debrided the superior wound on the left both wounds. The area on his bunion over the left first MTP medially is I think just about closed. We have been using Sorbact without a lot of success changed to Iodoflex under compression 3/11; left lateral and right lateral calf perhaps minor improvement in the surface condition. We have been using Iodoflex. The area over the bunion of the left first MTP has closed over 3/18; left lateral and right lateral calf not much improvement. We have been using Iodoflex. Aggressive debridement last week. 3/25; left lateral and right lateral calf. We have been using Iodoflex. There is some improvement in the superior area on the right and 2 on the lateral left although there is still a lot of debris on the surface. The inferior area on the right is still a completely nonviable surface. 4/8; left lateral and right lateral calfs. Essentially mirror-image looking wounds 2 wounds on each side in close juxtaposition we have been using Iodoflex with some improvement in the very adherent fibrinous debris but not a lot. The patient has arterial studies next Wednesday morning and venous studies next Thursday morning. I have been avoiding any further aggressive debridement until we see the arterial study results. His ABIs were fairly good in this clinic and is dorsalis pedis pulses are palpable but the wound beds are pale. 4/15; left lateral and right lateral calfs. Essentially mirror-image wounds with 2 wounds on each side in close juxtaposition but with rims of separating normal tissue. We have been using Iodoflex. The base of the wounds has been cleaning up quite nicely ARTERIAL STUDIES were done showing the patient had an ABI on the right of 1.27 with  triphasic waveforms and a TBI of 0.90 on the left triphasic waveforms with an ABI of 1.28 and a TBI of 0.87. No evidence of arterial disease VENOUS REFLUX STUDIES; were done yesterday we do not have this report yet. 4/23; VENOUS REFLUX STUDIES did not show any significant reflux right lower extremity. No evidence of a DVT He did have significant reflux in the common . femoral vein on the left but nothing else was listed as significant. He did not have a DVT. We have been using Iodoflex under compression his wounds are making progress. 4/29; improvements in the wound surface continue. We have  been using Iodoflex. He has a 20% out-of-pocket co-pay for Apligraf which is unfortunate. We changed him to Sorbact today 5/6; started on Sorbact last week. Better looking wound surface but not much change in dimensions. 01/24/20-Patient is back at 3 weeks, wound surfaces are about the same but very minimal slough on the right leg wounds, however there is blistering on both legs adjacent to the wounds, patient denies any other symptoms or new symptoms. His studies have been reviewed and do not indicate any significant arterial or venous disease. But he is attending once in 3 weeks with home health changing in between 6/3; patient has 4 wounds 2 on each side of his lateral lower legs. These wounds are somewhat improved. He apparently arrived in clinic last week with a large blister on the right lateral lower leg that had denuded into the wound. They changed to silver alginate. Still under compression. He has straight Medicare and unfortunately has an unlimited co-pay for advanced treatment options. 6/10; his original 4 wounds are all better especially on the left lateral lower leg. Areas on the right medial have a better looking surface were using silver collagen ooThe blister on the right lateral lower leg from last week has an open area however this looks fairly healthy were using silver alginate on  this ooHe comes in today having "stubbed" his left second toe he has an abrasion at the base of the nailbed I think he probably caught the nail which is mycotic. 6/24; blister on the right lateral lower leg has healed. There is no additional wounds. The traumatic left second toe is also closed. The 4 that he has are all better 7/1; all of the patient's wounds are smaller except for the proximal one on the left lateral calf. We are using silver collagen 7/22; patient has home health. Relatively refractory wounds on the lateral part of both mid calfs. Each of them 2 wounds. The areas on the left are doing much better in fact the distal 1 looks like it is on his way to closure. The right required debridement with adherent fibrinous debris under illumination. We have been using silver collagen. Previous arterial studies were within normal limits he also had venous reflux studies 04/03/2020 upon evaluation today patient appears to be doing excellent with regard to his lower extremity ulcers. He is going to require a little bit of debridement on the wounds on the right especially in order to clear away some biofilm and slough but this is minimal and overall he seems to be doing quite well. 8/19; the patient's wounds on the left lateral lower leg just above closed. However the 2 on the lateral part of the right have a nonviable necrotic surface. This is disappointing. There is also no change in dimensions. He has had arterial studies as well as venous reflux studies 8/26; very disappointing today. Even the more superficial areas on the left are about the same as last week. Still 2 punched-out areas completely unchanged on the right. We have been using silver collagen really making no progress. 9/2; changed to Iodoflex last week better looking wound surfaces especially on the right although they are deep and punched out. The areas on the left are more superficial open 1 of these is almost fully epithelialized  although it has been this way for at least 2 weeks. He has a 20% co-pay [Medicare only] unaffordable for a skin substitute. Had some thought about a snap VAC on the deep areas on the right  leg we will try to put this through in the insurance 9/9; punched-out wounds on the bilateral lateral lower extremities. We have been operating as if the these were chronic venous wounds with secondary lymphedema. The areas on the left have been doing well in fact one of them is closed over. We have had no improvement in the areas on the right we have been using Iodoflex to help with surface debridement. The areas on the right are deeper wider. I do not see any evidence of infection. Home health is not been putting the wraps on high enough he has localized lymphedema right above the wounds. He has had arterial and venous studies already 9/16; punched-out mirror-image wounds on his bilateral lateral mid calfs. We have 1 closed on the left lateral the other smaller. For the first time today the areas on the right lateral actually looks some better. I did a biopsy of 1 of these last time although we still do not have this result. HOWEVER he arrives in clinic today complaining of chest pain overnight which felt like a brick on his chest. He also had 2 black watery bowel movements. He does not have nausea or vomiting. He is not describing abdominal pain. He has no prior history of diarrhea heartburn etc. 9/30; sent this patient to the ER on 9/16. 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 He is on twice daily PPIs Protonix. His admission hemoglobin was exceptionally low at 5. 8.. Discharged at 8.1. 4 unit transfusion. 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 His diuretic dose was actually decreased because of elevated creatinine currently at torsemide 20 mg not felt to be a Coumadin candidate. Patient states he feels  well. The areas on his left leg are healed. The areas on the right look better. These have been very refractory wounds. He does not have stockings we will make arrangements for this today 10/7; the areas on the left leg are healed we will transition him into his 20/30 stocking today. Continued improvement in the areas on the right lateral leg 10/21; the areas on the left leg remain healed at this 2-week follow-up however there is a history that he developed some drainage from one of the wound sites with home health therefore going ahead and wrapping him. He was supposed to be on 20/30 stockings although the history here is vague and I am not sure that this represents a stocking failure or not. We have been wrapping his right leg with the one remaining wound is the distal wound. We have been using silver collagen 10/28 the patient only has 1 wound remaining on the right lower calf. This still has 3 mm of depth which is unchanged but the surface area is smaller. The superior wound on the right lateral is closed. The 2 wounds on the left remain closed and he is using his compression stocking 11/4 1 remaining wound on the right lower calf. Under illumination not a viable surface we have been using silver collagen we had good effect on the other areas but this 1 appears to be stalled. There is no open area on the left leg he is using his own stocking. 11/11; 1 remaining wound on the right lower calf. Surface of this is a lot better than last week but still requiring debridement I am using Iodoflex but looking forward to changing the primary dressing to perhaps  endoform 11/18; 1 remaining wound on the right lower leg, venous insufficiency, vigorous debridement last week/Iodoflex. Depth of the wound is therefore larger but the wound looks clean. Still very gritty material at the surface requiring debridement 12/2; the remaining wound on his right lower leg, venous insufficiency. I changed him to endoform 2  weeks ago. We appear to be making nice progress. Wound is measuring smaller Objective Constitutional Sitting or standing Blood Pressure is within target range for patient.. Pulse regular and within target range for patient.Marland Kitchen Respirations regular, non-labored and within target range.. Temperature is normal and within the target range for the patient.Marland Kitchen Appears in no distress. Vitals Time Taken: 7:54 AM, Height: 71 in, Weight: 198 lbs, BMI: 27.6, Temperature: 97.6 F, Pulse: 87 bpm, Respiratory Rate: 18 breaths/min, Blood Pressure: 101/65 mmHg. Cardiovascular Pedal pulses are palpable. General Notes: Wound exam; right lateral lower leg; this is the 1 remaining area of the 4 he had 2 on the right lateral and 2 on the left lateral. Under illumination the tissue looks healthy here. There is advancing epithelialization. We have good edema control. No evidence of surrounding infection Integumentary (Hair, Skin) Wound #4 status is Open. Original cause of wound was Blister. The wound is located on the Right,Distal,Lateral Lower Leg. The wound measures 1.7cm length x 0.8cm width x 0.3cm depth; 1.068cm^2 area and 0.32cm^3 volume. There is Fat Layer (Subcutaneous Tissue) exposed. There is no tunneling or undermining noted. There is a medium amount of serosanguineous drainage noted. The wound margin is well defined and not attached to the wound base. There is medium (34-66%) pink granulation within the wound bed. There is a medium (34-66%) amount of necrotic tissue within the wound bed including Adherent Slough. Assessment Active Problems ICD-10 Chronic venous hypertension (idiopathic) with inflammation of bilateral lower extremity Lymphedema, not elsewhere classified Non-pressure chronic ulcer of other part of left lower leg with fat layer exposed Non-pressure chronic ulcer of other part of right lower leg with fat layer exposed Abrasion, left foot, subsequent encounter Cardiomyopathy,  unspecified Melena Procedures Wound #4 Pre-procedure diagnosis of Wound #4 is a Venous Leg Ulcer located on the Right,Distal,Lateral Lower Leg . There was a Four Layer Compression Therapy Procedure by Baruch Gouty, RN. Post procedure Diagnosis Wound #4: Same as Pre-Procedure Plan Follow-up Appointments: Return Appointment in 2 weeks. Bathing/ Shower/ Hygiene: May shower with protection but do not get wound dressing(s) wet. May shower and wash wound with soap and water. - with dressing changes only. Edema Control - Lymphedema / SCD / Other: Elevate legs to the level of the heart or above for 30 minutes daily and/or when sitting, a frequency of: - 3-4 times a day throughout the day. Avoid standing for long periods of time. Exercise regularly Other Edema Control Orders/Instructions: - wear compression stocking to left leg daily. apply in the morning and remove at night. ensure to apply lotion nightly to left leg. Home Health: No change in wound care orders this week; continue Home Health for wound care. May utilize formulary equivalent dressing for wound treatment orders unless otherwise specified. - [facility] home [facility]. WOUND #4: - Lower Leg Wound Laterality: Right, Lateral, Distal Cleanser: Soap and Water (Home Health) 2 x Per MBT/59 Days Discharge Instructions: May shower and wash wound with dial antibacterial soap and water with dressing changes only. Peri-Wound Care: Sween Lotion (Moisturizing lotion) (Home Health) 2 x Per Day/15 Days Discharge Instructions: Apply moisturizing lotion as directed Prim Dressing: Endoform 2x2 in [facility]) 2 x Per Day/15  Days ary Discharge Instructions: moisten with saline. Secondary Dressing: Woven Gauze Sponge, Non-Sterile 4x4 in (Home Health) 2 x Per Day/15 Days Secondary Dressing: Drawtex 4x4 in (Home Health) 2 x Per HFW/26 Days Discharge Instructions: Apply over endoform. Com pression Wrap: FourPress (4 layer compression wrap) (Home  Health) 2 x Per Day/15 Days Discharge Instructions: Apply four layer compression just above toes and just below the knee. 1. We are continuing with endoform, drawtex under 4-layer compression 2. For the first time his wound really looks healthy under illumination no debridement was required. 3. Measurements are better 4. I will see him in 2 weeks hopefully this positive trend continues Electronic Signature(s) Signed: 07/31/2020 12:52:04 PM By: Linton Ham MD Entered By: Linton Ham on 07/31/2020 08:30:03 -------------------------------------------------------------------------------- SuperBill Details Patient Name: Date of Service: [place] 07/31/2020 Medical Record Number: [id number] Patient Account Number: [id number] Date of Birth/Sex: Treating RN: Sep 07, 1940 (79 y.o. [name], [name] Primary Care Provider: [name]., [name] Other Clinician: Referring Provider: Treating Provider/Extender: [name], [name] in Treatment: 42 Diagnosis Coding ICD-10 Codes Code Description [phone] Chronic venous hypertension (idiopathic) with inflammation of bilateral lower extremity I89.0 Lymphedema, not elsewhere classified L97.822 Non-pressure chronic ulcer of other part of left lower leg with fat layer exposed L97.812 Non-pressure chronic ulcer of other part of right lower leg with fat layer exposed S90.812D Abrasion, left foot, subsequent encounter I42.9 Cardiomyopathy, unspecified K92.1 Melena Facility Procedures The patient participates with Medicare or their insurance follows the Medicare Facility Guidelines: CPT4 Code Description Modifier Quantity [id number] (Facility Use Only) [phone] - APPLY [place] RT LEG 1 Physician Procedures : CPT4 Code Description Modifier [id number] - WC PHYS LEVEL 3 - EST PT 1 ICD-10 Diagnosis Description T65.465 Non-pressure chronic ulcer of other part of right lower leg with fat layer exposed I87.323 Chronic  venous hypertension (idiopathic) with  inflammation of bilateral lower extremity Quantity: Electronic Signature(s) Signed: 07/31/2020 12:52:04 PM By: Linton Ham MD Entered By: Linton Ham on 07/31/2020 08:30:23

## 2020-08-01 NOTE — Progress Notes (Signed)
[name], [name] ([id number]) Visit Report for 07/31/2020 Arrival Information Details Patient Name: Date of Service: [place] 07/31/2020 7:30 [place] Record Number: [id number] Patient Account Number: [id number] Date of Birth/Sex: Treating RN: 11/26/1940 (79 y.o. [name] Vernal Hritz: [name]., [name] Other Clinician: Referring Cortnie Ringel: Treating Eldra Word/Extender: [name], [name] in Treatment: 38 Visit Information History Since Last Visit Added or deleted any medications: No Patient Arrived: Cane Any new allergies or adverse reactions: No Arrival Time: 07:54 Had a fall or experienced change in No Accompanied By: self activities of daily living that may affect Transfer Assistance: None risk of falls: Patient Identification Verified: Yes Signs or symptoms of abuse/neglect since last visito No Secondary Verification Process Completed: Yes Hospitalized since last visit: No Patient Requires Transmission-Based Precautions: No Implantable device outside of the clinic excluding No Patient Has Alerts: Yes cellular tissue based products placed in the center Patient Alerts: Patient on Blood Thinner since last visit: Has Dressing in Place as Prescribed: Yes Pain Present Now: No Electronic Signature(s) Signed: 08/01/2020 1:37:58 PM By: Sandre Kitty Entered By: Sandre Kitty on 07/31/2020 07:54:51 -------------------------------------------------------------------------------- Compression Therapy Details Patient Name: Date of Service: [name] 07/31/2020 7:30 A M Medical Record Number: [id number] Patient Account Number: [id number] Date of Birth/Sex: Treating RN: Nov 25, 1940 (79 y.o. [name] [place]: [name]., [name] Other Clinician: Referring Dsean Vantol: Treating Mardella Nuckles/Extender: [name], [name] in Treatment: 42 Compression Therapy Performed for Wound  Assessment: Wound #4 Right,Distal,Lateral Lower Leg Performed By: Clinician Baruch Gouty, RN Compression Type: Four Layer Post Procedure Diagnosis Same as Pre-procedure Electronic Signature(s) Signed: 07/31/2020 5:54:37 PM By: Deon Pilling Entered By: Deon Pilling on 07/31/2020 08:19:58 -------------------------------------------------------------------------------- Encounter Discharge Information Details Patient Name: Date of Service: [place] 07/31/2020 7:30 [place] Record Number: [id number] Patient Account Number: [id number] Date of Birth/Sex: Treating RN: [birth year] (79 y.o. [name] Primary Care Shamar Kracke: [name]., [name] Other Clinician: Referring Mckinzi Eriksen: Treating Farhad Burleson/Extender: [name], [name] in Treatment: 8 Encounter Discharge Information Items Discharge Condition: Stable Ambulatory Status: Cane Discharge Destination: Home Transportation: Other Schedule Follow-up Appointment: Yes Clinical Summary of Care: Patient Declined Notes SCAT Electronic Signature(s) Signed: 07/31/2020 5:52:01 PM By: Baruch Gouty RN, BSN Entered By: Baruch Gouty on 07/31/2020 08:49:38 -------------------------------------------------------------------------------- Multi Wound Chart Details Patient Name: Date of Service: [place] 07/31/2020 7:30 [place] Record Number: [id number] Patient Account Number: [id number] Date of Birth/Sex: Treating RN: 02-Apr-1941 (79 y.o. [name], [name] Primary Care Erven Ramson: [name]., [name] Other Clinician: Referring Elleigh Cassetta: Treating Daton Szilagyi/Extender: [name], [name] in Treatment: 65 Vital Signs Height(in): 71 Pulse(bpm): 87 Weight(lbs): 198 Blood Pressure(mmHg): 101/65 Body Mass Index(BMI): 28 Temperature(F): 97.6 Respiratory Rate(breaths/min): 18 Photos: [4:No Photos Right, Distal, Lateral Lower Leg] [N/A:N/A N/A] Wound  Location: [4:Blister] [N/A:N/A] Wounding Event: [4:Venous Leg Ulcer] [N/A:N/A] Primary Etiology: [4:Anemia, Congestive Heart Failure,] [N/A:N/A] Comorbid History: [4:Hypertension, Peripheral Venous Disease, 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] [N/A:N/A] Date Acquired: [4:42] [N/A:N/A] Weeks of Treatment: [4:Open] [N/A:N/A] Wound Status: [4:1.7x0.8x0.3] [N/A:N/A] Measurements L x W x D (cm) [4:1.068] [N/A:N/A] A (cm) : rea [4:0.32] [N/A:N/A] Volume (cm) : [4:88.50%] [N/A:N/A] % Reduction in Area: [4:65.60%] [N/A:N/A] % Reduction in Volume: [4:Full Thickness Without Exposed] [N/A:N/A] Classification: [4:Support Structures Medium] [N/A:N/A] Exudate Amount: [4:Serosanguineous] [N/A:N/A] Exudate  Type: [4:red, brown] [N/A:N/A] Exudate Color: [4:Well defined, not attached] [N/A:N/A] Wound Margin: [4:Medium (34-66%)] [N/A:N/A] Granulation Amount: [4:Pink] [N/A:N/A] Granulation Quality: [4:Medium (34-66%)] [N/A:N/A] Necrotic Amount: [4:Fat Layer (Subcutaneous Tissue): Yes N/A] Exposed Structures: [4:Fascia: No Tendon: No Muscle: No Joint: No Bone: No Small (1-33%)] [N/A:N/A] Epithelialization: [4:Compression Therapy] [N/A:N/A] Treatment Notes Electronic Signature(s) Signed: 07/31/2020 12:52:04 PM By: Linton Ham MD Signed: 07/31/2020 5:54:37 PM By: Deon Pilling Entered By: Linton Ham on 07/31/2020 08:24:26 -------------------------------------------------------------------------------- Multi-Disciplinary Care Plan Details Patient Name: Date of Service: [place] [address] [place] 07/31/2020 7:30 A M Medical Record Number: [id number] Patient Account Number: [id number] Date of Birth/Sex: Treating RN: 09-Jan-1941 (79 y.o. [name], [name] Primary Care Annjanette Wertenberger: [name]., [name] Other Clinician: Referring Yehoshua Vitelli: Treating Deja Kaigler/Extender: [name], [name] in Treatment: 36 Active Inactive Venous Leg Ulcer Nursing Diagnoses: Knowledge deficit related  to disease process and management Potential for venous Insuffiency (use before diagnosis confirmed) Goals: Patient will maintain optimal edema control Date Initiated: 05/01/2020 Target Resolution Date: 08/29/2020 Goal Status: Active Interventions: Assess peripheral edema status every visit. Compression as ordered Provide education on venous insufficiency Notes: Electronic Signature(s) Signed: 07/31/2020 5:54:37 PM By: Deon Pilling Entered By: Deon Pilling on 07/31/2020 07:45:17 -------------------------------------------------------------------------------- Pain Assessment Details Patient Name: Date of Service: [name] [facility] 07/31/2020 7:30 [place] Record Number: [id number] Patient Account Number: [id number] Date of Birth/Sex: Treating RN: 23-Oct-1940 (79 y.o. [name] Taber Sweetser: [name]., [name] Other Clinician: Referring Khaliq Turay: Treating Laksh Hinners/Extender: [name], [name] in Treatment: 44 Active Problems Location of Pain Severity and Description of Pain Patient Has Paino No Site Locations Pain Management and Medication Current Pain Management: Electronic Signature(s) Signed: 07/31/2020 5:54:37 PM By: Deon Pilling Signed: 08/01/2020 1:37:58 PM By: Sandre Kitty Entered By: Sandre Kitty on 07/31/2020 07:55:51 -------------------------------------------------------------------------------- Patient/Caregiver Education Details Patient Name: Date of Service: [place] 12/2/2021andnbsp7:30 A M Medical Record Number: [id number] Patient Account Number: [id number] Date of Birth/Gender: Treating RN: 01-04-1941 (79 y.o. [name] Physician: [name]., [name] Other Clinician: Referring Physician: Treating Physician/Extender: [name], [name] in Treatment: 15 Education Assessment Education Provided To: Patient Education Topics  Provided Venous: Handouts: Controlling Swelling with Multilayered Compression Wraps Methods: Explain/Verbal Responses: Reinforcements needed Electronic Signature(s) Signed: 07/31/2020 5:54:37 PM By: Deon Pilling Entered By: Deon Pilling on 07/31/2020 07:45:31 -------------------------------------------------------------------------------- Wound Assessment Details Patient Name: Date of Service: [name] [facility] 07/31/2020 7:30 [place] Record Number: [id number] Patient Account Number: [id number] Date of Birth/Sex: Treating RN: 02/18/1941 (79 y.o. [name], [name] Primary Care [place]: [name]., [name] Other Clinician: Referring Shaan Rhoads: Treating Latroya Ng/Extender: [name], [name] in Treatment: 42 Wound Status Wound Number: 4 Primary Venous Leg Ulcer Etiology: Wound Location: Right, Distal, Lateral Lower Leg Wound Open Wounding Event: Blister Status: Date Acquired: 09/11/2019 Comorbid Anemia, Congestive Heart Failure, Hypertension, Peripheral Weeks Of Treatment: 42 History: Venous Disease, End Stage Renal Disease Clustered Wound: No Wound Measurements Length: (cm) 1.7 Width: (cm) 0.8 Depth: (cm) 0.3 Area: (cm) 1.068 Volume: (cm) 0.32 % Reduction in Area: 88.5% % Reduction in Volume: 65.6% Epithelialization: Small (1-33%) Tunneling: No Undermining: No Wound Description Classification: Full Thickness Without Exposed Support Structures Wound Margin: Well defined, not attached Exudate Amount: Medium Exudate Type: Serosanguineous Exudate Color: red, brown Foul Odor After Cleansing: No Slough/Fibrino  Yes Wound Bed Granulation Amount: Medium (34-66%) Exposed Structure Granulation Quality: Pink Fascia Exposed: No Necrotic Amount: Medium (34-66%) Fat Layer (Subcutaneous Tissue) Exposed: Yes Necrotic Quality: Adherent Slough Tendon Exposed: No Muscle Exposed: No Joint Exposed: No Bone Exposed: No Treatment Notes Wound #4  (Lower Leg) Wound Laterality: Right, Lateral, Distal Cleanser Soap and Water Discharge Instruction: May shower and wash wound with dial antibacterial soap and water with dressing changes only. Peri-Wound Care Sween Lotion (Moisturizing lotion) Discharge Instruction: Apply moisturizing lotion as directed Topical Primary Dressing Endoform 2x2 in Discharge Instruction: moisten with saline. Secondary Dressing Woven Gauze Sponge, Non-Sterile 4x4 in Drawtex 4x4 in Discharge Instruction: Apply over endoform. Secured With Compression Wrap FourPress (4 layer compression wrap) Discharge Instruction: Apply four layer compression just above toes and just below the knee. Compression Stockings Add-Ons Notes compression stocking left leg Electronic Signature(s) Signed: 07/31/2020 4:55:20 PM By: [name] RN Signed: 07/31/2020 5:54:37 PM By: Deon Pilling Entered By: [name] on 07/31/2020 08:16:41 -------------------------------------------------------------------------------- Vitals Details Patient Name: Date of Service: [place] 07/31/2020 7:30 [place] Record Number: [id number] Patient Account Number: [id number] Date of Birth/Sex: Treating RN: 10/14/1940 (79 y.o. [name], [name] Primary Care Winona S[place]: [name]., [name] Other Clinician: Referring Marielena Harvell: Treating Kayley Zeiders/Extender: [name], [name] in Treatment: 5 Vital Signs Time Taken: 07:54 Temperature (F): 97.6 Height (in): 71 Pulse (bpm): 87 Weight (lbs): 198 Respiratory Rate (breaths/min): 18 Body Mass Index (BMI): 27.6 Blood Pressure (mmHg): 101/65 Reference Range: 80 - 120 mg / dl Electronic Signature(s) Signed: 08/01/2020 1:37:58 PM By: Sandre Kitty Entered By: Sandre Kitty on 07/31/2020 07:55:38

## 2020-08-14 ENCOUNTER — Other Ambulatory Visit: Payer: Self-pay

## 2020-08-14 ENCOUNTER — Encounter (HOSPITAL_BASED_OUTPATIENT_CLINIC_OR_DEPARTMENT_OTHER): Payer: Medicare Other | Admitting: Internal Medicine

## 2020-08-14 DIAGNOSIS — E1151 Type 2 diabetes mellitus with diabetic peripheral angiopathy without gangrene: Secondary | ICD-10-CM | POA: Diagnosis not present

## 2020-08-15 NOTE — Progress Notes (Signed)
[name], [name] ([id number]) Visit Report for 08/14/2020 Arrival Information Details Patient Name: Date of Service: [name] 08/14/2020 8:00 [name] Number: [id number] Patient Account Number: [id number] Date of Birth/Sex: Treating RN: 04/10/1941 (79 y.o. [name]) [name] Primary Care [name]: [name]., [name] Other Clinician: Referring Demontre Padin: Treating Petr Bontempo/Extender: [name], [name] in Treatment: 73 Visit Information History Since Last Visit All ordered tests and consults were completed: No Patient Arrived: [name] Added or deleted any medications: No Arrival Time: 08:05 Any new allergies or adverse reactions: No Accompanied By: self Had a fall or experienced change in No Transfer Assistance: None activities of daily living that may affect Patient Identification Verified: Yes risk of falls: Secondary Verification Process Completed: Yes Signs or symptoms of abuse/neglect since last visito No Patient Requires Transmission-Based Precautions: No Hospitalized since last visit: No Patient Has Alerts: Yes Implantable device outside of the clinic excluding No Patient Alerts: Patient on Blood Thinner cellular tissue based products placed in the center since last visit: Has Dressing in Place as Prescribed: Yes Has Compression in Place as Prescribed: Yes Pain Present Now: No Electronic Signature(s) Signed: 08/15/2020 5:23:39 PM By: [name] RN Entered By: [name] on 08/14/2020 08:14:32 -------------------------------------------------------------------------------- Compression Therapy Details Patient Name: Date of Service: [place] [name] [name] 08/14/2020 8:00 [place] Record Number: [id number] Patient Account Number: [id number] Date of Birth/Sex: Treating RN: 16-Feb-1941 (79 y.o. [name] Primary Care Jaylah Goodlow: [name]., [name] Other Clinician: Referring Agostino Gorin: Treating [name]/Extender: [name], [name] in Treatment: 11 Compression Therapy Performed for Wound Assessment: Wound #10 Left,Lateral Lower Leg Performed By: Clinician Rhae Hammock, RN Compression Type: Four Layer Post Procedure Diagnosis Same as Pre-procedure Electronic Signature(s) Signed: 08/14/2020 5:23:27 PM By: Deon Pilling Entered By: Deon Pilling on 08/14/2020 08:43:26 -------------------------------------------------------------------------------- Compression Therapy Details Patient Name: Date of Service: [place] [name] [place] 08/14/2020 8:00 [place] Record Number: [id number] Patient Account Number: [id number] Date of Birth/Sex: Treating RN: Jan 23, 1941 (79 y.o. [name] Primary Care Koltan Portocarrero: [name]., [name] Other Clinician: Referring Burel Kahre: Treating Dwon Sky/Extender: [name], [name] in Treatment: 44 Compression Therapy Performed for Wound Assessment: Wound #9 Left,Anterior Lower Leg Performed By: Clinician Rhae Hammock, RN Compression Type: Four Layer Post Procedure Diagnosis Same as Pre-procedure Electronic Signature(s) Signed: 08/14/2020 5:23:27 PM By: Deon Pilling Entered By: Deon Pilling on 08/14/2020 08:43:26 -------------------------------------------------------------------------------- Compression Therapy Details Patient Name: Date of Service: [name], [name] 08/14/2020 8:00 [place] Record Number: [id number] Patient Account Number: [id number] Date of Birth/Sex: Treating RN: [birth year] (79 y.o. [name] Primary Care Earlin Sweeden: [name]., [name] Other Clinician: Referring Wil Slape: Treating Akim Watkinson/Extender: [name], [name] in Treatment: 57 Compression Therapy Performed for Wound Assessment: Wound #4 Right,Distal,Lateral Lower Leg Performed By: Clinician Rhae Hammock, RN Compression Type: Four Layer Post Procedure Diagnosis Same as  Pre-procedure Electronic Signature(s) Signed: 08/14/2020 5:23:27 PM By: Deon Pilling Entered By: Deon Pilling on 08/14/2020 08:43:26 -------------------------------------------------------------------------------- Encounter Discharge Information Details Patient Name: Date of Service: [name], [name] 08/14/2020 8:00 [name] Record Number: [id number] Patient Account Number: [id number] Date of Birth/Sex: Treating RN: [birth year] (79 y.o. [name] Primary Care Carry Ortez: [name]., [name] Other Clinician: Referring Tacarra Justo: Treating [name]/Extender: [name], [name] in  Treatment: 44 Encounter Discharge Information Items Post Procedure Vitals Discharge Condition: Stable Temperature (F): 98.1 Ambulatory Status: Cane Pulse (bpm): 94 Discharge Destination: Home Respiratory Rate (breaths/min): 18 Transportation: Private Auto Blood Pressure (mmHg): 124/73 Accompanied By: alone Schedule Follow-up Appointment: Yes Clinical Summary of Care: Patient Declined Electronic Signature(s) Signed: 08/15/2020 5:46:53 PM By: Levan Hurst RN, BSN Entered By: Levan Hurst on 08/14/2020 17:18:43 -------------------------------------------------------------------------------- Lower Extremity Assessment Details Patient Name: Date of Service: [name] [name] 08/14/2020 8:00 [place] Number: [id number] Patient Account Number: [id number] Date of Birth/Sex: Treating RN: [birth year] (79 y.o. [name]) [name] Primary Care Noemy Hallmon: [name]., [name] Other Clinician: Referring Amory Zbikowski: Treating Claudie Brickhouse/Extender: [name], [name] in Treatment: 44 Edema Assessment Assessed: [Left: No] [Right: No] Edema: [Left: Yes] [Right: Yes] Calf Left: Right: Point of Measurement: 53 cm From Medial Instep 40 cm 40 cm Ankle Left: Right: Point of Measurement: 14 cm From Medial Instep 23 cm 23 cm Electronic  Signature(s) Signed: 08/15/2020 5:23:39 PM By: [name] RN Entered By: [name] on 08/14/2020 08:15:45 -------------------------------------------------------------------------------- Multi Wound Chart Details Patient Name: Date of Service: [place] 08/14/2020 8:00 [place] Record Number: [id number] Patient Account Number: [id number] Date of Birth/Sex: Treating RN: 07-May-1941 (79 y.o. [name], [name] Primary Care Jobina Maita: [name]., [name] Other Clinician: Referring Dewanna Hurston: Treating Iley Breeden/Extender: [name], [name] in Treatment: 34 Vital Signs Height(in): 71 Pulse(bpm): 94 Weight(lbs): 198 Blood Pressure(mmHg): 124/73 Body Mass Index(BMI): 28 Temperature(F): 98.1 Respiratory Rate(breaths/min): 18 Photos: [10:No Photos Left, Lateral Lower Leg] [4:No Photos Right, Distal, Lateral Lower Leg] [9:No Photos Left, Anterior Lower Leg] Wound Location: [10:Gradually Appeared] [4:Blister] [9:Gradually Appeared] Wounding Event: [10:Venous Leg Ulcer] [4:Venous Leg Ulcer] [9:Venous Leg Ulcer] Primary Etiology: [10:Anemia, Congestive Heart Failure,] [4:Anemia, Congestive Heart Failure,] [9:Anemia, Congestive Heart Failure,] Comorbid History: [10:Hypertension, Peripheral Venous Disease, End Stage Renal Disease Disease, End Stage Renal Disease Disease, End Stage Renal Disease 08/06/2020] [4:Hypertension, Peripheral Venous 09/11/2019] [9:Hypertension, Peripheral Venous  08/06/2020] Date Acquired: [10:0] [4:44] [9:0] Weeks of Treatment: [10:Open] [4:Open] [9:Open] Wound Status: [10:1.1x1x0.1] [4:2x1x0.2] [9:1x1x0.1] Measurements L x W x D (cm) [10:0.864] [4:1.571] [9:0.785] A (cm) : rea [10:0.086] [4:0.314] [9:0.079] Volume (cm) : [10:N/A] [4:83.10%] [9:N/A] % Reduction in A [10:rea: N/A] [4:66.20%] [9:N/A] % Reduction in Volume: [10:Full Thickness Without Exposed] [4:Full Thickness Without Exposed] [9:Full Thickness Without  Exposed] Classification: [10:Support Structures Medium] [4:Support Structures Medium] [9:Support Structures Medium] Exudate A mount: [10:Serosanguineous] [4:Serosanguineous] [9:Serosanguineous] Exudate Type: [10:red, brown] [4:red, brown] [9:red, brown] Exudate Color: [10:N/A] [4:Well defined, not attached] [9:N/A] Wound Margin: [10:Medium (34-66%)] [4:Medium (34-66%)] [9:Medium (34-66%)] Granulation A mount: [10:Pink, Pale] [4:Pink] [9:Red, Pink, Pale] Granulation Quality: [10:Medium (34-66%)] [4:Medium (34-66%)] [9:Medium (34-66%)] Necrotic A mount: [10:Fat Layer (Subcutaneous Tissue): Yes Fat Layer (Subcutaneous Tissue): Yes Fat Layer (Subcutaneous Tissue): Yes] Exposed Structures: [10:Fascia: No Tendon: No Muscle: No Joint: No Bone: No None] [4:Fascia: No Tendon: No Muscle: No Joint: No Bone: No Small (1-33%)] [9:Fascia: No Tendon: No Muscle: No Joint: No Bone: No None] Epithelialization: [10:N/A] [4:Debridement - Excisional] [9:Debridement - Excisional] Debridement: Pre-procedure Verification/Time Out N/A [4:08:39] [9:08:39] Taken: [10:N/A] [4:Lidocaine 4% Topical Solution] [9:Lidocaine 4% Topical Solution] Pain Control: [10:N/A] [4:Subcutaneous, Slough] [9:Subcutaneous, Slough] Tissue Debrided: [10:N/A] [4:Skin/Subcutaneous Tissue] [9:Skin/Subcutaneous Tissue] Level: [10:N/A] [4:2] [9:1] Debridement A (sq cm): [10:rea N/A] [4:Curette] [9:Curette] Instrument: [10:N/A] [4:Minimum] [9:Minimum] Bleeding: [10:N/A] [4:Pressure] [9:Pressure] Hemostasis A chieved: [10:N/A] [4:0] [9:0] Procedural Pain: [10:N/A] [4:0] [  9:0] Post Procedural Pain: [10:N/A] [4:Procedure was tolerated well] [9:Procedure was tolerated well] Debridement Treatment Response: [10:N/A] [4:2x1x0.2] [9:1x1x0.1] Post Debridement Measurements L x W x D (cm) [10:N/A] [4:0.314] [9:0.079] Post Debridement Volume: (cm) [10:Compression Therapy] [4:Compression Therapy] [9:Compression [place] Procedures Performed:  [4:Debridement] [9:Debridement] Treatment Notes Electronic Signature(s) Signed: 08/14/2020 5:23:27 PM By: Deon Pilling Signed: 08/15/2020 8:01:39 AM By: Linton Ham MD Entered By: Linton Ham on 08/14/2020 08:49:39 -------------------------------------------------------------------------------- Multi-Disciplinary Care Plan Details Patient Name: Date of Service: [place] 08/14/2020 8:00 [place] Record Number: [id number] Patient Account Number: [id number] Date of Birth/Sex: Treating RN: 05/23/1941 (79 y.o. [name], [name] Primary Care Aleasha Fregeau: [name]., [name] Other Clinician: Referring Undine Nealis: Treating Surafel Hilleary/Extender: [name], [name] in Treatment: 90 Active Inactive Venous Leg Ulcer Nursing Diagnoses: Knowledge deficit related to disease process and management Potential for venous Insuffiency (use before diagnosis confirmed) Goals: Patient will maintain optimal edema control Date Initiated: 05/01/2020 Target Resolution Date: 09/18/2020 Goal Status: Active Interventions: Assess peripheral edema status every visit. Compression as ordered Provide education on venous insufficiency Notes: Electronic Signature(s) Signed: 08/14/2020 5:23:27 PM By: Deon Pilling Entered By: Deon Pilling on 08/14/2020 07:59:18 -------------------------------------------------------------------------------- Pain Assessment Details Patient Name: Date of Service: [place] [name] [place] 08/14/2020 8:00 [place] Number: [id number] Patient Account Number: [id number] Date of Birth/Sex: Treating RN: [birth year] (79 y.o. [name]) [name] Primary Care Maralee Higuchi: [name]., [name] Other Clinician: Referring Kyli Sorter: Treating Calib Wadhwa/Extender: [name], [name] in Treatment: 35 Active Problems Location of Pain Severity and Description of Pain Patient Has Paino No Site Locations Pain Management and  Medication Current Pain Management: Electronic Signature(s) Signed: 08/15/2020 5:23:39 PM By: [name] RN Entered By: [name] on 08/14/2020 08:15:13 -------------------------------------------------------------------------------- Patient/Caregiver Education Details Patient Name: Date of Service: [place], [place] 12/16/2021andnbsp8:00 [place] Record Number: [id number] Patient Account Number: [id number] Date of Birth/Gender: Treating RN: Jul 24, 1941 (79 y.o. [name], [name] Primary Care Physician: [name]., [name] Other Clinician: Referring Physician: Treating Physician/Extender: [name], [name] in Treatment: 28 Education Assessment Education Provided To: Patient Education Topics Provided Venous: Handouts: Managing Venous Disease and Related Ulcers Methods: Explain/Verbal Responses: Reinforcements needed Electronic Signature(s) Signed: 08/14/2020 5:23:27 PM By: Deon Pilling Entered By: Deon Pilling on 08/14/2020 08:00:00 -------------------------------------------------------------------------------- Wound Assessment Details Patient Name: Date of Service: [place] [name] 08/14/2020 8:00 [place] Record Number: [id number] Patient Account Number: [id number] Date of Birth/Sex: Treating RN: 1941/06/29 (79 y.o. [name]) [name] Primary Care Larene Ascencio: [name]., [name] Other Clinician: Referring Arkeem Harts: Treating Shakir Petrosino/Extender: [name], [name] in Treatment: 44 Wound Status Wound Number: 10 Primary Venous Leg Ulcer Etiology: Wound Location: Left, Lateral Lower Leg Wound Open Wounding Event: Gradually Appeared Status: Date Acquired: 08/06/2020 Comorbid Anemia, Congestive Heart Failure, Hypertension, Peripheral Weeks Of Treatment: 0 History: Venous Disease, End Stage Renal Disease Clustered Wound: No Photos Photo Uploaded By: Mikeal Hawthorne on 08/15/2020 14:38:41 Wound  Measurements Length: (cm) 1.1 Width: (cm) 1 Depth: (cm) 0.1 Area: (cm) 0.864 Volume: (cm) 0.086 % Reduction in Area: % Reduction in Volume: Epithelialization: None Tunneling: No Undermining: No Wound Description Classification: Full Thickness Without Exposed Support Structures Exudate Amount: Medium Exudate Type: Serosanguineous Exudate Color: red, brown Foul Odor After Cleansing: No Slough/Fibrino Yes Wound Bed Granulation Amount: Medium (34-66%) Exposed Structure Granulation Quality: Pink,  Pale Fascia Exposed: No Necrotic Amount: Medium (34-66%) Fat Layer (Subcutaneous Tissue) Exposed: Yes Necrotic Quality: Adherent Slough Tendon Exposed: No Muscle Exposed: No Joint Exposed: No Bone Exposed: No Treatment Notes Wound #10 (Lower Leg) Wound Laterality: Left, Lateral Cleanser Soap and Water Discharge Instruction: May shower and wash wound with dial antibacterial soap and water with dressing changes only. Peri-Wound Care Sween Lotion (Moisturizing lotion) Discharge Instruction: Apply moisturizing lotion as directed Topical Primary Dressing KerraCel Ag Gelling Fiber Dressing, 2x2 in (silver alginate) Discharge Instruction: Apply silver alginate to wound bed as instructed Secondary Dressing Woven Gauze Sponge, Non-Sterile 4x4 in Secured With Compression Wrap FourPress (4 layer compression wrap) Discharge Instruction: Apply four layer compression just above toes and just below the knee. Compression Stockings Add-Ons Electronic Signature(s) Signed: 08/15/2020 5:23:39 PM By: [name] RN Entered By: [name] on 08/14/2020 08:18:44 -------------------------------------------------------------------------------- Wound Assessment Details Patient Name: Date of Service: [name] [facility] [place] 08/14/2020 8:00 [place] Record Number: [id number] Patient Account Number: [id number] Date of Birth/Sex: Treating RN: September 16, 1940 (79 y.o. [name]) [name] Primary Care  Rajendra Spiller: [name]., [name] Other Clinician: Referring Livana Yerian: Treating Marlina Cataldi/Extender: [name], [name] in Treatment: 44 Wound Status Wound Number: 4 Primary Venous Leg Ulcer Etiology: Wound Location: Right, Distal, Lateral Lower Leg Wound Open Wounding Event: Blister Status: Date Acquired: 09/11/2019 Comorbid Anemia, Congestive Heart Failure, Hypertension, Peripheral Weeks Of Treatment: 44 History: Venous Disease, End Stage Renal Disease Clustered Wound: No Photos Photo Uploaded By: Mikeal Hawthorne on 08/15/2020 14:38:55 Wound Measurements Length: (cm) 2 Width: (cm) 1 Depth: (cm) 0.2 Area: (cm) 1.571 Volume: (cm) 0.314 % Reduction in Area: 83.1% % Reduction in Volume: 66.2% Epithelialization: Small (1-33%) Tunneling: No Undermining: No Wound Description Classification: Full Thickness Without Exposed Support Structures Wound Margin: Well defined, not attached Exudate Amount: Medium Exudate Type: Serosanguineous Exudate Color: red, brown Foul Odor After Cleansing: No Slough/Fibrino Yes Wound Bed Granulation Amount: Medium (34-66%) Exposed Structure Granulation Quality: Pink Fascia Exposed: No Necrotic Amount: Medium (34-66%) Fat Layer (Subcutaneous Tissue) Exposed: Yes Necrotic Quality: Adherent Slough Tendon Exposed: No Muscle Exposed: No Joint Exposed: No Bone Exposed: No Treatment Notes Wound #4 (Lower Leg) Wound Laterality: Right, Lateral, Distal Cleanser Soap and Water Discharge Instruction: May shower and wash wound with dial antibacterial soap and water with dressing changes only. Peri-Wound Care Sween Lotion (Moisturizing lotion) Discharge Instruction: Apply moisturizing lotion as directed Topical Primary Dressing IODOFLEX 0.9% Cadexomer Iodine Pad 4x6 cm Discharge Instruction: Or Iodosorb. Apply to wound bed as instructed Secondary Dressing Woven Gauze Sponge, Non-Sterile 4x4 in Secured With Compression  Wrap FourPress (4 layer compression wrap) Discharge Instruction: Apply four layer compression just above toes and just below the knee. Compression Stockings Add-Ons Electronic Signature(s) Signed: 08/15/2020 5:23:39 PM By: [name] RN Entered By: [name] on 08/14/2020 08:16:17 -------------------------------------------------------------------------------- Wound Assessment Details Patient Name: Date of Service: [name] 08/14/2020 8:00 [place] Record Number: [id number] Patient Account Number: [id number] Date of Birth/Sex: Treating RN: [birth year] (79 y.o. [name]) [name] Primary Care Vasil Juhasz: [name]., [name] Other Clinician: Referring Chayanne Filippi: Treating Sydna Brodowski/Extender: [name], [name] in Treatment: 44 Wound Status Wound Number: 9 Primary Venous Leg Ulcer Etiology: Wound Location: Left, Anterior Lower Leg Wound Open Wounding Event: Gradually Appeared Status: Date Acquired: 08/06/2020 Comorbid Anemia, Congestive Heart Failure, Hypertension, Peripheral Weeks Of Treatment: 0 History: Venous Disease, End Stage Renal Disease Clustered Wound: No  Photos Photo Uploaded By: Mikeal Hawthorne on 08/15/2020 14:38:41 Wound Measurements Length: (cm) 1 Width: (cm) 1 Depth: (cm) 0.1 Area: (cm) 0.785 Volume: (cm) 0.079 % Reduction in Area: % Reduction in Volume: Epithelialization: None Tunneling: No Undermining: No Wound Description Classification: Full Thickness Without Exposed Support Structures Exudate Amount: Medium Exudate Type: Serosanguineous Exudate Color: red, brown Foul Odor After Cleansing: No Slough/Fibrino Yes Wound Bed Granulation Amount: Medium (34-66%) Exposed Structure Granulation Quality: Red, Pink, Pale Fascia Exposed: No Necrotic Amount: Medium (34-66%) Fat Layer (Subcutaneous Tissue) Exposed: Yes Necrotic Quality: Adherent Slough Tendon Exposed: No Muscle Exposed: No Joint Exposed: No Bone  Exposed: No Treatment Notes Wound #9 (Lower Leg) Wound Laterality: Left, Anterior Cleanser Soap and Water Discharge Instruction: May shower and wash wound with dial antibacterial soap and water with dressing changes only. Peri-Wound Care Sween Lotion (Moisturizing lotion) Discharge Instruction: Apply moisturizing lotion as directed Topical Primary Dressing KerraCel Ag Gelling Fiber Dressing, 2x2 in (silver alginate) Discharge Instruction: Apply silver alginate to wound bed as instructed Secondary Dressing Woven Gauze Sponge, Non-Sterile 4x4 in Secured With Compression Wrap FourPress (4 layer compression wrap) Discharge Instruction: Apply four layer compression just above toes and just below the knee. Compression Stockings Add-Ons Electronic Signature(s) Signed: 08/15/2020 5:23:39 PM By: [name] RN Entered By: [name] on 08/14/2020 08:17:32 -------------------------------------------------------------------------------- Vitals Details Patient Name: Date of Service: [place] 08/14/2020 8:00 [place] Record Number: [id number] Patient Account Number: [id number] Date of Birth/Sex: Treating RN: 1940-11-19 (79 y.o. [name]) [name] Primary Care Somya Jauregui: [name]., [name] Other Clinician: Referring Andreka Stucki: Treating Almeta Geisel/Extender: [name], [name] in Treatment: 55 Vital Signs Time Taken: 08:14 Temperature (F): 98.1 Height (in): 71 Pulse (bpm): 94 Weight (lbs): 198 Respiratory Rate (breaths/min): 18 Body Mass Index (BMI): 27.6 Blood Pressure (mmHg): 124/73 Reference Range: 80 - 120 mg / dl Electronic Signature(s) Signed: 08/15/2020 5:23:39 PM By: [name] RN Entered By: [name] on 08/14/2020 08:15:07

## 2020-08-15 NOTE — Progress Notes (Signed)
JAQUANN, GUARISCO ([id number]) Visit Report for 08/14/2020 Debridement Details Patient Name: Date of Service: [place], [place] 08/14/2020 8:00 [place] Record Number: [id number] Patient Account Number: [id number] Date of Birth/Sex: Treating RN: [birth year] (79 y.o. [name] Provider: [name]., [name] Other Clinician: Referring Provider: Treating Provider/Extender: [name], [name] in Treatment: 44 Debridement Performed for Assessment: Wound #4 Right,Distal,Lateral Lower Leg Performed By: Physician Ricard Dillon., MD Debridement Type: Debridement Severity of Tissue Pre Debridement: Fat layer exposed Level of Consciousness (Pre-procedure): Awake and Alert Pre-procedure Verification/Time Out Yes - 08:39 Taken: Start Time: 08:40 Pain Control: Lidocaine 4% T opical Solution T Area Debrided (L x W): otal 2 (cm) x 1 (cm) = 2 (cm) Tissue and other material debrided: Viable, Non-Viable, Slough, Subcutaneous, Skin: Dermis , Skin: Epidermis, Fibrin/Exudate, Slough Level: Skin/Subcutaneous Tissue Debridement Description: Excisional Instrument: Curette Bleeding: Minimum Hemostasis Achieved: Pressure End Time: 08:42 Procedural Pain: 0 Post Procedural Pain: 0 Response to Treatment: Procedure was tolerated well Level of Consciousness (Post- Awake and Alert procedure): Post Debridement Measurements of Total Wound Length: (cm) 2 Width: (cm) 1 Depth: (cm) 0.2 Volume: (cm) 0.314 Character of Wound/Ulcer Post Debridement: Improved Severity of Tissue Post Debridement: Necrosis of muscle Post Procedure Diagnosis Same as Pre-procedure Electronic Signature(s) Signed: 08/14/2020 5:23:27 PM By: Deon Pilling Signed: 08/15/2020 8:01:39 AM By: Linton Ham MD Entered By: Linton Ham on 08/14/2020 08:49:51 -------------------------------------------------------------------------------- Debridement Details Patient Name: Date of  Service: [place], [place] 08/14/2020 8:00 A M Medical Record Number: [id number] Patient Account Number: [id number] Date of Birth/Sex: Treating RN: 31-Jul-1941 (79 y.o. [name] Provider: [name]., [name] Other Clinician: Referring Provider: Treating Provider/Extender: [name], [name] in Treatment: 44 Debridement Performed for Assessment: Wound #9 Left,Anterior Lower Leg Performed By: Physician Ricard Dillon., MD Debridement Type: Debridement Severity of Tissue Pre Debridement: Fat layer exposed Level of Consciousness (Pre-procedure): Awake and Alert Pre-procedure Verification/Time Out Yes - 08:39 Taken: Start Time: 08:40 Pain Control: Lidocaine 4% T opical Solution T Area Debrided (L x W): otal 1 (cm) x 1 (cm) = 1 (cm) Tissue and other material debrided: Viable, Non-Viable, Slough, Subcutaneous, Skin: Dermis , Skin: Epidermis, Fibrin/Exudate, Slough Level: Skin/Subcutaneous Tissue Debridement Description: Excisional Instrument: Curette Bleeding: Minimum Hemostasis Achieved: Pressure End Time: 08:42 Procedural Pain: 0 Post Procedural Pain: 0 Response to Treatment: Procedure was tolerated well Level of Consciousness (Post- Awake and Alert procedure): Post Debridement Measurements of Total Wound Length: (cm) 1 Width: (cm) 1 Depth: (cm) 0.1 Volume: (cm) 0.079 Character of Wound/Ulcer Post Debridement: Improved Severity of Tissue Post Debridement: Fat layer exposed Post Procedure Diagnosis Same as Pre-procedure Electronic Signature(s) Signed: 08/14/2020 5:23:27 PM By: Deon Pilling Signed: 08/15/2020 8:01:39 AM By: Linton Ham MD Entered By: Linton Ham on 08/14/2020 08:49:58 -------------------------------------------------------------------------------- HPI Details Patient Name: Date of Service: [place], [place] 08/14/2020 8:00 [place] Record Number: [id number] Patient Account Number:  [id number] Date of Birth/Sex: Treating RN: 1941-08-30 (79 y.o. [name], [name] Primary Care Provider: [name]., [name] Other Clinician: Referring Provider: Treating Provider/Extender: [name], [name] in Treatment: 48 History of Present Illness HPI Description: ADMISSION 10/11/2019 This is a 79 year old man with a history of a severe cardiomyopathy with an ejection fraction of about 20%, chronic renal failure stage III. He is listed as a type II diabetic in epic  although the patient denies this. He also has a history of PVD. He states for the last month he has had wounds on his bilateral lower extremities that started off as blisters which denuded. He has areas on the left lateral calf and 2 on the right lateral. He has an area on the left first met head which he did not know was there he we identified this on intake. He has been using Silvadene cream provided by his primary care physician but he is complaining that this burns. 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 He was acutely ill at that time complicating GI bleed. ABIs in our clinic were 1.16 on the right and 1.13 on the left 2/25; the patient comes in with his areas on the left lateral and right lateral calf. There is also an area over the left first MTP bunion deformity. We have been using Sorbact. His edema control is fairly good 3/4; left lateral and right lateral calf. Most of his wounds are in the same position tightly adherent nonviable debris. On the right we debrided the superior wound on the left both wounds. The area on his bunion over the  left first MTP medially is I think just about closed. We have been using Sorbact without a lot of success changed to Iodoflex under compression 3/11; left lateral and right lateral calf perhaps minor improvement in the surface condition. We have been using Iodoflex. The area over the bunion of the left first MTP has closed over 3/18; left lateral and right lateral calf not much improvement. We have been using Iodoflex. Aggressive debridement last week. 3/25; left lateral and right lateral calf. We have been using Iodoflex. There is some improvement in the superior area on the right and 2 on the lateral left although there is still a lot of debris on the surface. The inferior area on the right is still a completely nonviable surface. 4/8; left lateral and right lateral calfs. Essentially mirror-image looking wounds 2 wounds on each side in close juxtaposition we have been using Iodoflex with some improvement in the very adherent fibrinous debris but not a lot. The patient has arterial studies next Wednesday morning and venous studies next Thursday morning. I have been avoiding any further aggressive debridement until we see the arterial study results. His ABIs were fairly good in this clinic and is dorsalis pedis pulses are palpable but the wound beds are pale. 4/15; left lateral and right lateral calfs. Essentially mirror-image wounds with 2 wounds on each side in close juxtaposition but with rims of separating normal tissue. We have been using Iodoflex. The base of the wounds has been cleaning up quite nicely ARTERIAL STUDIES were done showing the patient had an ABI on the right of 1.27 with triphasic waveforms and a TBI of 0.90 on the left triphasic waveforms with an ABI of 1.28 and a TBI of 0.87. No evidence of arterial disease VENOUS REFLUX STUDIES; were done yesterday we do not have this report yet. 4/23; VENOUS REFLUX STUDIES did not show any significant reflux right lower extremity. No  evidence of a DVT He did have significant reflux in the common . femoral vein on  the left but nothing else was listed as significant. He did not have a DVT. We have been using Iodoflex under compression his wounds are making progress. 4/29; improvements in the wound surface continue. We have been using Iodoflex. He has a 20% out-of-pocket co-pay for Apligraf which is unfortunate. We changed him to Sorbact today 5/6; started on Sorbact last week. Better looking wound surface but not much change in dimensions. 01/24/20-Patient is back at 3 weeks, wound surfaces are about the same but very minimal slough on the right leg wounds, however there is blistering on both legs adjacent to the wounds, patient denies any other symptoms or new symptoms. His studies have been reviewed and do not indicate any significant arterial or venous disease. But he is attending once in 3 weeks with home health changing in between 6/3; patient has 4 wounds 2 on each side of his lateral lower legs. These wounds are somewhat improved. He apparently arrived in clinic last week with a large blister on the right lateral lower leg that had denuded into the wound. They changed to silver alginate. Still under compression. He has straight Medicare and unfortunately has an unlimited co-pay for advanced treatment options. 6/10; his original 4 wounds are all better especially on the left lateral lower leg. Areas on the right medial have a better looking surface were using silver collagen The blister on the right lateral lower leg from last week has an open area however this looks fairly healthy were using silver alginate on this He comes in today having "stubbed" his left second toe he has an abrasion at the base of the nailbed I think he probably caught the nail which is mycotic. 6/24; blister on the right lateral lower leg has healed. There is no additional wounds. The traumatic left second toe is also closed. The 4 that he has are all  better 7/1; all of the patient's wounds are smaller except for the proximal one on the left lateral calf. We are using silver collagen 7/22; patient has home health. Relatively refractory wounds on the lateral part of both mid calfs. Each of them 2 wounds. The areas on the left are doing much better in fact the distal 1 looks like it is on his way to closure. The right required debridement with adherent fibrinous debris under illumination. We have been using silver collagen. Previous arterial studies were within normal limits he also had venous reflux studies 04/03/2020 upon evaluation today patient appears to be doing excellent with regard to his lower extremity ulcers. He is going to require a little bit of debridement on the wounds on the right especially in order to clear away some biofilm and slough but this is minimal and overall he seems to be doing quite well. 8/19; the patient's wounds on the left lateral lower leg just above closed. However the 2 on the lateral part of the right have a nonviable necrotic surface. This is disappointing. There is also no change in dimensions. He has had arterial studies as well as venous reflux studies 8/26; very disappointing today. Even the more superficial areas on the left are about the same as last week. Still 2 punched-out areas completely unchanged on the right. We have been using silver collagen really making no progress. 9/2; changed to Iodoflex last week better looking wound surfaces especially on the right although they are deep and punched out. The areas on the left are more superficial open 1 of these is almost fully epithelialized although it  has been this way for at least 2 weeks. He has a 20% co-pay [Medicare only] unaffordable for a skin substitute. Had some thought about a snap VAC on the deep areas on the right leg we will try to put this through in the insurance 9/9; punched-out wounds on the bilateral lateral lower extremities. We have been  operating as if the these were chronic venous wounds with secondary lymphedema. The areas on the left have been doing well in fact one of them is closed over. We have had no improvement in the areas on the right we have been using Iodoflex to help with surface debridement. The areas on the right are deeper wider. I do not see any evidence of infection. Home health is not been putting the wraps on high enough he has localized lymphedema right above the wounds. He has had arterial and venous studies already 9/16; punched-out mirror-image wounds on his bilateral lateral mid calfs. We have 1 closed on the left lateral the other smaller. For the first time today the areas on the right lateral actually looks some better. I did a biopsy of 1 of these last time although we still do not have this result. HOWEVER he arrives in clinic today complaining of chest pain overnight which felt like a brick on his chest. He also had 2 black watery bowel movements. He does not have nausea or vomiting. He is not describing abdominal pain. He has no prior history of diarrhea heartburn etc. 9/30; sent this patient to the ER on 9/16. 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 He is on twice daily PPIs Protonix. His admission hemoglobin was exceptionally low at 5. 8.. Discharged at 8.1. 4 unit transfusion. 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 His diuretic dose was actually decreased because of elevated creatinine currently at torsemide 20 mg not felt to be a Coumadin candidate. Patient states he feels well. The areas on his left leg are healed. The areas on the right look better. These have been very refractory wounds. He does not have stockings we will make arrangements for this today 10/7; the areas on the left leg are healed we will transition him into his 20/30 stocking today. Continued improvement in the areas on the  right lateral leg 10/21; the areas on the left leg remain healed at this 2-week follow-up however there is a history that he developed some drainage from one of the wound sites with home health therefore going ahead and wrapping him. He was supposed to be on 20/30 stockings although the history here is vague and I am not sure that this represents a stocking failure or not. We have been wrapping his right leg with the one remaining wound is the distal wound. We have been using silver collagen 10/28 the patient only has 1 wound remaining on the right lower calf. This still has 3 mm of depth which is unchanged but the surface area is smaller. The superior wound on the right lateral is closed. The 2 wounds on the left remain closed and he is using his compression stocking 11/4 1 remaining wound on the right lower calf. Under illumination not a viable surface we have been using silver collagen we had good effect on the other areas but this 1 appears to be stalled. There is no open area on the left leg he is using his own stocking. 11/11;  1 remaining wound on the right lower calf. Surface of this is a lot better than last week but still requiring debridement I am using Iodoflex but looking forward to changing the primary dressing to perhaps endoform 11/18; 1 remaining wound on the right lower leg, venous insufficiency, vigorous debridement last week/Iodoflex. Depth of the wound is therefore larger but the wound looks clean. Still very gritty material at the surface requiring debridement 12/2; the remaining wound on his right lower leg, venous insufficiency. I changed him to endoform 2 weeks ago. We appear to be making nice progress. Wound is measuring smaller 12/16; he comes in today with one of his two wounds on the left opened infected second area on the left may be threatened as well as the healed one on the right. He has edema in both legs which I think is pitting. He says his weights are stable he has  unknown fairly severe cardiomyopathy. His cardiologist is Dr. Brigitte Pulse it does not sound like he is really been carefully followed Electronic Signature(s) Signed: 08/15/2020 8:01:39 AM By: Linton Ham MD Entered By: Linton Ham on 08/14/2020 08:51:23 -------------------------------------------------------------------------------- Physical Exam Details Patient Name: Date of Service: [place], [place] 08/14/2020 8:00 [place] Number: [id number] Patient Account Number: [id number] Date of Birth/Sex: Treating RN: 1941/02/07 (79 y.o. [name], [name] Primary Care Provider: [name]., [name] Other Clinician: Referring Provider: Treating Provider/Extender: [name], [name] in Treatment: 51 Constitutional Sitting or standing Blood Pressure is within target range for patient.. Pulse regular and within target range for patient.Marland Kitchen Respirations regular, non-labored and within target range.. Temperature is normal and within the target range for the patient.Marland Kitchen Appears in no distress. Respiratory Normal. Bronchial on the right. Cardiovascular Soft pansystolic murmur at the PMI which is shifted to the anterior axillary line. His jugular venous pressure is elevated to the angle of jaw at 45 degrees he has sacral pitting edema. Notes Wound exam; right lateral lower leg wound still some debris on the surface which I removed with a #3 curette hemostasis with direct pressure. The area above this that we healed out some weeks ago looks threatened probably because of underlying edema in the right leg on the left similarly one area is open and the other again looks threatened probably because of subcutaneous edema. Neither one of these areas looks infected Electronic Signature(s) Signed: 08/15/2020 8:01:39 AM By: Linton Ham MD Entered By: Linton Ham on 08/14/2020  08:53:15 -------------------------------------------------------------------------------- Physician Orders Details Patient Name: Date of Service: [place], [place] 08/14/2020 8:00 [place] Number: [id number] Patient Account Number: [id number] Date of Birth/Sex: Treating RN: September 25, 1940 (79 y.o. 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 Bathing/ Shower/ Hygiene May shower with protection but do not get wound dressing(s) wet. May shower and wash wound with soap and water. - with dressing changes only. Edema Control - Lymphedema /  SCD / Other Elevate legs to the level of the heart or above for 30 minutes daily and/or when sitting, a frequency of: - 3-4 times a day throughout the day. Avoid standing for long periods of time. Exercise regularly Additional Orders / Instructions Other: - ***Patient to please follow up with Cardiologist or primary related to fluid overload to get in today or tomorrow.**** Home Health New wound care orders this week; continue Home Health for wound care. May utilize formulary equivalent dressing for wound treatment orders unless otherwise specified. - [facility] home [facility]. Wound Treatment Wound #10 - Lower Leg Wound Laterality: Left, Lateral Cleanser: Soap and Water  (Home Health) 2 x Per Week/15 Days Discharge Instructions: May shower and wash wound with dial antibacterial soap and water with dressing changes only. Peri-Wound Care: Sween Lotion (Moisturizing lotion) (Home Health) 2 x Per Week/15 Days Discharge Instructions: Apply moisturizing lotion as directed Prim Dressing: KerraCel Ag Gelling Fiber Dressing, 2x2 in (silver alginate) (Home Health) 2 x Per Week/15 Days ary Discharge Instructions: Apply silver alginate to wound bed as instructed Secondary Dressing: Woven Gauze Sponge, Non-Sterile 4x4 in (Home Health) 2 x Per Week/15 Days Compression Wrap: FourPress (4 layer compression wrap) (Home Health) 2 x Per Week/15 Days Discharge Instructions: Apply four layer compression just above toes and just below the knee. Wound #4 - Lower Leg Wound Laterality: Right, Lateral, Distal Cleanser: Soap and Water (Home Health) 2 x Per Week/15 Days Discharge Instructions: May shower and wash wound with dial antibacterial soap and water with dressing changes only. Peri-Wound Care: Sween Lotion (Moisturizing lotion) (Home Health) 2 x Per Week/15 Days Discharge Instructions: Apply moisturizing lotion as directed Prim Dressing: IODOFLEX 0.9% Cadexomer Iodine Pad 4x6 cm (Home Health) 2 x Per Week/15 Days ary Discharge Instructions: Or Iodosorb. Apply to wound bed as instructed Secondary Dressing: Woven Gauze Sponge, Non-Sterile 4x4 in (Home Health) 2 x Per Week/15 Days Compression Wrap: FourPress (4 layer compression wrap) (Home Health) 2 x Per Week/15 Days Discharge Instructions: Apply four layer compression just above toes and just below the knee. Wound #9 - Lower Leg Wound Laterality: Left, Anterior Cleanser: Soap and Water (Home Health) 2 x Per Week/15 Days Discharge Instructions: May shower and wash wound with dial antibacterial soap and water with dressing changes only. Peri-Wound Care: Sween Lotion (Moisturizing lotion) (Home Health) 2 x Per Week/15  Days Discharge Instructions: Apply moisturizing lotion as directed Prim Dressing: KerraCel Ag Gelling Fiber Dressing, 2x2 in (silver alginate) (Home Health) 2 x Per Week/15 Days ary Discharge Instructions: Apply silver alginate to wound bed as instructed Secondary Dressing: Woven Gauze Sponge, Non-Sterile 4x4 in (Home Health) 2 x Per Week/15 Days Compression Wrap: FourPress (4 layer compression wrap) (Home Health) 2 x Per Week/15 Days Discharge Instructions: Apply four layer compression just above toes and just below the knee. Electronic Signature(s) Signed: 08/14/2020 5:23:27 PM By: Deon Pilling Signed: 08/15/2020 8:01:39 AM By: Linton Ham MD Entered By: Deon Pilling on 08/14/2020 08:48:58 -------------------------------------------------------------------------------- Problem List Details Patient Name: Date of Service: [place] 08/14/2020 8:00 [place] Record Number: [id number] Patient Account Number: [id number] Date of Birth/Sex: Treating RN: 07/16/1941 (79 y.o. [name] Provider: Other Clinician: [name]., [name] Referring Provider: Treating Provider/Extender: [name], [name] in Treatment: 34 Active Problems ICD-10 Encounter Code Description Active Date MDM Diagnosis I87.323 Chronic venous hypertension (idiopathic) with inflammation of bilateral lower 10/11/2019 No Yes extremity I89.0 Lymphedema, not elsewhere  classified 10/11/2019 No Yes L97.822 Non-pressure chronic ulcer of other part of left lower leg with fat layer exposed2/06/2020 No Yes L97.812 Non-pressure chronic ulcer of other part of right lower leg with fat layer 01/03/2020 No Yes exposed S90.812D Abrasion, left foot, subsequent encounter 02/07/2020 No Yes I42.9 Cardiomyopathy, unspecified 05/15/2020 No Yes K92.1 Melena 05/15/2020 No Yes Inactive Problems ICD-10 Code Description Active Date Inactive Date L97.521 Non-pressure chronic ulcer of  other part of left foot limited to breakdown of skin 10/11/2019 10/11/2019 Resolved Problems ICD-10 Code Description Active Date Resolved Date 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 10/11/2019 10/11/2019 Electronic Signature(s) Signed: 08/15/2020 8:01:39 AM By: Linton Ham MD Entered By: Linton Ham on 08/14/2020 08:49:30 -------------------------------------------------------------------------------- Progress Note Details Patient Name: Date of Service: [place] 08/14/2020 8:00 [place] Record Number: [id number] Patient Account Number: [id number] Date of Birth/Sex: Treating RN: [birth year] (79 y.o. [name], [name] Primary Care Provider: [name]., [name] Other Clinician: Referring Provider: Treating Provider/Extender: [name], [name] in Treatment: 87 Subjective History of Present Illness (HPI) ADMISSION 10/11/2019 This is a 79 year old man with a history of a severe cardiomyopathy with an ejection fraction of about 20%, chronic renal failure stage III. He is listed as a type II diabetic in epic although the patient denies this. He also has a history of PVD. He states for the last month he has had wounds on his bilateral lower extremities that started off as blisters which denuded. He has areas on the left lateral calf and 2 on the right lateral. He has an area on the left first met head which he did not know was there he we identified this on intake. He has been using Silvadene cream provided by his primary care physician but he is complaining that this burns. 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 He was acutely ill at that time complicating GI bleed. ABIs in our clinic were 1.16 on the right and 1.13 on the left 2/25; the patient comes in with his areas on the left lateral and right lateral calf. There is also an area over the left first MTP bunion deformity. We have been using Sorbact. His edema control is fairly good 3/4; left lateral and right lateral calf. Most of his wounds are in the same position tightly adherent nonviable debris. On the right we debrided the superior wound on the left both wounds. The area on his bunion over the left first MTP medially is I think just about closed. We have been using Sorbact without a lot of success changed to Iodoflex under compression 3/11; left lateral and right lateral calf perhaps minor improvement in the surface condition. We have been using Iodoflex. The area over the bunion of the left first MTP has closed over 3/18; left lateral and right lateral calf not much improvement. We have been using Iodoflex. Aggressive debridement last week. 3/25; left lateral and right lateral calf. We have been using Iodoflex. There is some improvement in the superior area on the right and 2 on the lateral left although there is still a lot of debris on  the surface. The inferior area on the right is still a completely nonviable surface. 4/8; left lateral and right lateral calfs. Essentially mirror-image looking wounds 2 wounds on each side in close juxtaposition we have been using Iodoflex with some improvement in the very adherent fibrinous debris but not a lot. The patient has arterial studies next Wednesday morning and venous studies next Thursday morning. I have been avoiding any further aggressive debridement until we see the arterial study results. His ABIs were fairly good in this clinic and is dorsalis pedis pulses are palpable but the wound beds are pale. 4/15; left lateral and right lateral calfs.  Essentially mirror-image wounds with 2 wounds on each side in close juxtaposition but with rims of separating normal tissue. We have been using Iodoflex. The base of the wounds has been cleaning up quite nicely ARTERIAL STUDIES were done showing the patient had an ABI on the right of 1.27 with triphasic waveforms and a TBI of 0.90 on the left triphasic waveforms with an ABI of 1.28 and a TBI of 0.87. No evidence of arterial disease VENOUS REFLUX STUDIES; were done yesterday we do not have this report yet. 4/23; VENOUS REFLUX STUDIES did not show any significant reflux right lower extremity. No evidence of a DVT He did have significant reflux in the common . femoral vein on the left but nothing else was listed as significant. He did not have a DVT. We have been using Iodoflex under compression his wounds are making progress. 4/29; improvements in the wound surface continue. We have been using Iodoflex. He has a 20% out-of-pocket co-pay for Apligraf which is unfortunate. We changed him to Sorbact today 5/6; started on Sorbact last week. Better looking wound surface but not much change in dimensions. 01/24/20-Patient is back at 3 weeks, wound surfaces are about the same but very minimal slough on the right leg wounds, however there is blistering on both legs adjacent to the wounds, patient denies any other symptoms or new symptoms. His studies have been reviewed and do not indicate any significant arterial or venous disease. But he is attending once in 3 weeks with home health changing in between 6/3; patient has 4 wounds 2 on each side of his lateral lower legs. These wounds are somewhat improved. He apparently arrived in clinic last week with a large blister on the right lateral lower leg that had denuded into the wound. They changed to silver alginate. Still under compression. He has straight Medicare and unfortunately has an unlimited co-pay for advanced treatment options. 6/10; his original 4  wounds are all better especially on the left lateral lower leg. 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 6/24; blister on the right lateral lower leg has healed. There is no additional wounds. The traumatic left second toe is also closed. The 4 that he has are all better 7/1; all of the patient's wounds are smaller except for the proximal one on the left lateral calf. We are using silver collagen 7/22; patient has home health. Relatively refractory wounds on the lateral part of both mid calfs. Each of them 2 wounds. The areas on the  left are doing much better in fact the distal 1 looks like it is on his way to closure. The right required debridement with adherent fibrinous debris under illumination. We have been using silver collagen. Previous arterial studies were within normal limits he also had venous reflux studies 04/03/2020 upon evaluation today patient appears to be doing excellent with regard to his lower extremity ulcers. He is going to require a little bit of debridement on the wounds on the right especially in order to clear away some biofilm and slough but this is minimal and overall he seems to be doing quite well. 8/19; the patient's wounds on the left lateral lower leg just above closed. However the 2 on the lateral part of the right have a nonviable necrotic surface. This is disappointing. There is also no change in dimensions. He has had arterial studies as well as venous reflux studies 8/26; very disappointing today. Even the more superficial areas on the left are about the same as last week. Still 2 punched-out areas completely unchanged on the  right. We have been using silver collagen really making no progress. 9/2; changed to Iodoflex last week better looking wound surfaces especially on the right although they are deep and punched out. The areas on the left are more superficial open 1 of these is almost fully epithelialized although it has been this way for at least 2 weeks. He has a 20% co-pay [Medicare only] unaffordable for a skin substitute. Had some thought about a snap VAC on the deep areas on the right leg we will try to put this through in the insurance 9/9; punched-out wounds on the bilateral lateral lower extremities. We have been operating as if the these were chronic venous wounds with secondary lymphedema. The areas on the left have been doing well in fact one of them is closed over. We have had no improvement in the areas on the right we have been using Iodoflex to help with surface debridement. The areas on the right are deeper wider. I do not see any evidence of infection. Home health is not been putting the wraps on high enough he has localized lymphedema right above the wounds. He has had arterial and venous studies already 9/16; punched-out mirror-image wounds on his bilateral lateral mid calfs. We have 1 closed on the left lateral the other smaller. For the first time today the areas on the right lateral actually looks some better. I did a biopsy of 1 of these last time although we still do not have this result. HOWEVER he arrives in clinic today complaining of chest pain overnight which felt like a brick on his chest. He also had 2 black watery bowel movements. He does not have nausea or vomiting. He is not describing abdominal pain. He has no prior history of diarrhea heartburn etc. 9/30; sent this patient to the ER on 9/16. 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 He is on twice daily PPIs Protonix. His admission hemoglobin was exceptionally low at 5. 8.. Discharged at 8.1. 4 unit transfusion. He  was also felt to have congestive heart failure with elevated BNP's he has an EF less than 20 with left ventricular thrombus. His diuretic dose was actually decreased because of elevated creatinine currently at torsemide 20 mg not felt to be a Coumadin candidate. Patient states he feels well. The areas on his left leg are healed. The areas on the right look better. These have been very refractory wounds. He does  not have stockings we will make arrangements for this today 10/7; the areas on the left leg are healed we will transition him into his 20/30 stocking today. Continued improvement in the areas on the right lateral leg 10/21; the areas on the left leg remain healed at this 2-week follow-up however there is a history that he developed some drainage from one of the wound sites with home health therefore going ahead and wrapping him. He was supposed to be on 20/30 stockings although the history here is vague and I am not sure that this represents a stocking failure or not. We have been wrapping his right leg with the one remaining wound is the distal wound. We have been using silver collagen 10/28 the patient only has 1 wound remaining on the right lower calf. This still has 3 mm of depth which is unchanged but the surface area is smaller. The superior wound on the right lateral is closed. The 2 wounds on the left remain closed and he is using his compression stocking 11/4 1 remaining wound on the right lower calf. Under illumination not a viable surface we have been using silver collagen we had good effect on the other areas but this 1 appears to be stalled. There is no open area on the left leg he is using his own stocking. 11/11; 1 remaining wound on the right lower calf. Surface of this is a lot better than last week but still requiring debridement I am using Iodoflex but looking forward to changing the primary dressing to perhaps endoform 11/18; 1 remaining wound on the right lower leg, venous  insufficiency, vigorous debridement last week/Iodoflex. Depth of the wound is therefore larger but the wound looks clean. Still very gritty material at the surface requiring debridement 12/2; the remaining wound on his right lower leg, venous insufficiency. I changed him to endoform 2 weeks ago. We appear to be making nice progress. Wound is measuring smaller 12/16; he comes in today with one of his two wounds on the left opened infected second area on the left may be threatened as well as the healed one on the right. He has edema in both legs which I think is pitting. He says his weights are stable he has unknown fairly severe cardiomyopathy. His cardiologist is Dr. Brigitte Pulse it does not sound like he is really been carefully followed Objective Constitutional Sitting or standing Blood Pressure is within target range for patient.. Pulse regular and within target range for patient.Marland Kitchen Respirations regular, non-labored and within target range.. Temperature is normal and within the target range for the patient.Marland Kitchen Appears in no distress. Vitals Time Taken: 8:14 AM, Height: 71 in, Weight: 198 lbs, BMI: 27.6, Temperature: 98.1 F, Pulse: 94 bpm, Respiratory Rate: 18 breaths/min, Blood Pressure: 124/73 mmHg. Respiratory Normal. Bronchial on the right. Cardiovascular Soft pansystolic murmur at the PMI which is shifted to the anterior axillary line. His jugular venous pressure is elevated to the angle of jaw at 45 degrees he has sacral pitting edema. General Notes: Wound exam; right lateral lower leg wound still some debris on the surface which I removed with a #3 curette hemostasis with direct pressure. The area above this that we healed out some weeks ago looks threatened probably because of underlying edema in the right leg on the left similarly one area is open and the other again looks threatened probably because of subcutaneous edema. Neither one of these areas looks infected Integumentary (Hair,  Skin) Wound #10 status is Open. Original  cause of wound was Gradually Appeared. The wound is located on the Left,Lateral Lower Leg. The wound measures 1.1cm length x 1cm width x 0.1cm depth; 0.864cm^2 area and 0.086cm^3 volume. There is Fat Layer (Subcutaneous Tissue) exposed. There is no tunneling or undermining noted. There is a medium amount of serosanguineous drainage noted. There is medium (34-66%) pink, pale granulation within the wound bed. There is a medium (34-66%) amount of necrotic tissue within the wound bed including Adherent Slough. Wound #4 status is Open. Original cause of wound was Blister. The wound is located on the Right,Distal,Lateral Lower Leg. The wound measures 2cm length x 1cm width x 0.2cm depth; 1.571cm^2 area and 0.314cm^3 volume. There is Fat Layer (Subcutaneous Tissue) exposed. There is no tunneling or undermining noted. There is a medium amount of serosanguineous drainage noted. The wound margin is well defined and not attached to the wound base. There is medium (34-66%) pink granulation within the wound bed. There is a medium (34-66%) amount of necrotic tissue within the wound bed including Adherent Slough. Wound #9 status is Open. Original cause of wound was Gradually Appeared. The wound is located on the Left,Anterior Lower Leg. The wound measures 1cm length x 1cm width x 0.1cm depth; 0.785cm^2 area and 0.079cm^3 volume. There is Fat Layer (Subcutaneous Tissue) exposed. There is no tunneling or undermining noted. There is a medium amount of serosanguineous drainage noted. There is medium (34-66%) red, pink, pale granulation within the wound bed. There is a medium (34-66%) amount of necrotic tissue within the wound bed including Adherent Slough. Assessment Active Problems ICD-10 Chronic venous hypertension (idiopathic) with inflammation of bilateral lower extremity Lymphedema, not elsewhere classified Non-pressure chronic ulcer of other part of left lower leg with  fat layer exposed Non-pressure chronic ulcer of other part of right lower leg with fat layer exposed Abrasion, left foot, subsequent encounter Cardiomyopathy, unspecified [facility] Procedures Wound #4 Pre-procedure diagnosis of Wound #4 is a Venous Leg Ulcer located on the Right,Distal,Lateral Lower Leg .Severity of Tissue Pre Debridement is: Fat layer exposed. There was a Excisional Skin/Subcutaneous Tissue Debridement with a total area of 2 sq cm performed by Ricard Dillon., MD. With the following instrument(s): Curette to remove Viable and Non-Viable tissue/material. Material removed includes Subcutaneous Tissue, Slough, Skin: Dermis, Skin: Epidermis, and Fibrin/Exudate after achieving pain control using Lidocaine 4% Topical Solution. A time out was conducted at 08:39, prior to the start of the procedure. A Minimum amount of bleeding was controlled with Pressure. The procedure was tolerated well with a pain level of 0 throughout and a pain level of 0 following the procedure. Post Debridement Measurements: 2cm length x 1cm width x 0.2cm depth; 0.314cm^3 volume. Character of Wound/Ulcer Post Debridement is improved. Severity of Tissue Post Debridement is: Necrosis of muscle. 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 There was a Four Layer Compression Therapy Procedure by Rhae Hammock, RN. Post procedure Diagnosis Wound #4: Same as Pre-Procedure Wound #9 Pre-procedure diagnosis of Wound #9 is a Venous Leg Ulcer located on the Left,Anterior Lower Leg .Severity of Tissue Pre Debridement is: Fat layer exposed. There was a Excisional Skin/Subcutaneous Tissue Debridement with a total area of 1 sq cm performed by Ricard Dillon., MD. With the following instrument(s): Curette to remove Viable and Non-Viable tissue/material. Material removed includes Subcutaneous Tissue,  Slough, Skin: Dermis, Skin: Epidermis, and Fibrin/Exudate after achieving pain control using Lidocaine 4%  Topical Solution. A time out was conducted at 08:39, prior to the start of the procedure. A Minimum amount of bleeding was controlled with Pressure. The procedure was tolerated well with a pain level of 0 throughout and a pain level of 0 following the procedure. Post Debridement Measurements: 1cm length x 1cm width x 0.1cm depth; 0.079cm^3 volume. Character of Wound/Ulcer Post Debridement is improved. Severity of Tissue Post Debridement is: Fat layer exposed. Post procedure Diagnosis Wound #9: Same as Pre-Procedure Pre-procedure diagnosis of Wound #9 is a Venous Leg Ulcer located on the Left,Anterior Lower Leg . There was a Four Layer Compression Therapy Procedure by Rhae Hammock, RN. Post procedure Diagnosis Wound #9: Same as Pre-Procedure Wound #10 Pre-procedure diagnosis of Wound #10 is a Venous Leg Ulcer located on the Left,Lateral Lower Leg . There was a Four Layer Compression Therapy Procedure by Rhae Hammock, RN. Post procedure Diagnosis Wound #10: Same as Pre-Procedure Plan Follow-up Appointments: Return Appointment in 1 week. Bathing/ Shower/ Hygiene: May shower with protection but do not get wound dressing(s) wet. May shower and wash wound with soap and water. - with dressing changes only. Edema Control - Lymphedema / SCD / Other: Elevate legs to the level of the heart or above for 30 minutes daily and/or when sitting, a frequency of: - 3-4 times a day throughout the day. Avoid standing for long periods of time. Exercise regularly Additional Orders / Instructions: Other: - ***Patient to please follow up with Cardiologist or primary related to fluid overload to get in today or tomorrow.**** Home Health: New wound care orders this week; continue Home Health for wound care. May utilize formulary equivalent dressing for wound treatment orders unless otherwise specified.  - [facility] home [facility]. WOUND #10: - Lower Leg Wound Laterality: Left, Lateral Cleanser: Soap and Water (Home Health) 2 x Per Week/15 Days Discharge Instructions: May shower and wash wound with dial antibacterial soap and water with dressing changes only. Peri-Wound Care: Sween Lotion (Moisturizing lotion) (Home Health) 2 x Per Week/15 Days Discharge Instructions: Apply moisturizing lotion as directed Prim Dressing: KerraCel Ag Gelling Fiber Dressing, 2x2 in (silver alginate) (Home Health) 2 x Per Week/15 Days ary Discharge Instructions: Apply silver alginate to wound bed as instructed Secondary Dressing: Woven Gauze Sponge, Non-Sterile 4x4 in (Home Health) 2 x Per Week/15 Days Com pression Wrap: FourPress (4 layer compression wrap) (Home Health) 2 x Per Week/15 Days Discharge Instructions: Apply four layer compression just above toes and just below the knee. WOUND #4: - Lower Leg Wound Laterality: Right, Lateral, Distal Cleanser: Soap and Water (Home Health) 2 x Per Week/15 Days Discharge Instructions: May shower and wash wound with dial antibacterial soap and water with dressing changes only. Peri-Wound Care: Sween Lotion (Moisturizing lotion) (Home Health) 2 x Per Week/15 Days Discharge Instructions: Apply moisturizing lotion as directed Prim Dressing: IODOFLEX 0.9% Cadexomer Iodine Pad 4x6 cm (Home Health) 2 x Per Week/15 Days ary Discharge Instructions: Or Iodosorb. Apply to wound bed as instructed Secondary Dressing: Woven Gauze Sponge, Non-Sterile 4x4 in (Home Health) 2 x Per Week/15 Days Com pression Wrap: FourPress (4 layer compression wrap) (Home Health) 2 x Per Week/15 Days Discharge Instructions: Apply four layer compression just above toes and just below the knee. WOUND #9: - Lower Leg Wound Laterality: Left, Anterior Cleanser: Soap and Water (Home Health) 2 x Per Week/15 Days Discharge Instructions: May shower and wash wound with dial antibacterial soap and water with dressing  changes only. Peri-Wound Care: Sween Lotion (Moisturizing lotion) (Home  Health) 2 x Per Week/15 Days Discharge Instructions: Apply moisturizing lotion as directed Prim Dressing: KerraCel Ag Gelling Fiber Dressing, 2x2 in (silver alginate) (Home Health) 2 x Per Week/15 Days ary Discharge Instructions: Apply silver alginate to wound bed as instructed Secondary Dressing: Woven Gauze Sponge, Non-Sterile 4x4 in (Home Health) 2 x Per Week/15 Days Com pression Wrap: FourPress (4 layer compression wrap) (Home Health) 2 x Per Week/15 Days Discharge Instructions: Apply four layer compression just above toes and just below the knee. 1. I am continuing with the Iodoflex on the right. We are going to put silver alginate on the reopening on the left 2. I think the reopening and deterioration is because of systemic fluid volume overload although he says his weight has not changed that much at 192 pounds he looks significantly fluid volume overloaded. I even think he has ascites perhaps a right pleural effusion. 3. I have asked him to see Dr. Manuella Ghazi who is his cardiologist but he also sees a Dr. Vickki Muff at the [facility] clinic as his primary doctor either one of these before the weekend. I think he needs an increase in his diuretic Electronic Signature(s) Signed: 08/15/2020 8:01:39 AM By: Linton Ham MD Entered By: Linton Ham on 08/14/2020 08:54:43 -------------------------------------------------------------------------------- SuperBill Details Patient Name: Date of Service: [place], [place] 08/14/2020 Medical Record Number: [id number] Patient Account Number: [id number] Date of Birth/Sex: Treating RN: Jun 05, 1941 (79 y.o. [name], [name] Primary Care Provider: [name]., [name] Other Clinician: Referring Provider: Treating Provider/Extender: [name], [name] in Treatment: 44 Diagnosis Coding ICD-10 Codes Code Description [phone] Chronic venous hypertension  (idiopathic) with inflammation of bilateral lower extremity I89.0 Lymphedema, not elsewhere classified L97.822 Non-pressure chronic ulcer of other part of left lower leg with fat layer exposed L97.812 Non-pressure chronic ulcer of other part of right lower leg with fat layer exposed S90.812D Abrasion, left foot, subsequent encounter I42.9 Cardiomyopathy, unspecified K92.1 [facility] Facility Procedures The patient participates with Medicare or their insurance follows the Medicare Facility Guidelines: CPT4 Code Description Modifier Quantity [id number] 11042 - DEB SUBQ TISSUE 20 SQ CM/< 1 ICD-10 Diagnosis Description L97.822 Non-pressure chronic ulcer of  other part of left lower leg with fat layer exposed L97.812 Non-pressure chronic ulcer of other part of right lower leg with fat layer exposed Physician Procedures : CPT4 Code Description Modifier [id number] - WC PHYS SUBQ TISS 20 SQ CM ICD-10 Diagnosis Description L97.822 Non-pressure chronic ulcer of other part of left lower leg with fat layer exposed L97.812 Non-pressure chronic ulcer of other part of right  lower leg with fat layer exposed Quantity: 1 Electronic Signature(s) Signed: 08/15/2020 8:01:39 AM By: Linton Ham MD Entered By: Linton Ham on 08/14/2020 08:55:22

## 2020-08-21 ENCOUNTER — Encounter (HOSPITAL_BASED_OUTPATIENT_CLINIC_OR_DEPARTMENT_OTHER): Payer: Medicare Other | Admitting: Internal Medicine

## 2020-08-21 ENCOUNTER — Other Ambulatory Visit: Payer: Self-pay

## 2020-08-21 DIAGNOSIS — E1151 Type 2 diabetes mellitus with diabetic peripheral angiopathy without gangrene: Secondary | ICD-10-CM | POA: Diagnosis not present

## 2020-08-21 NOTE — Progress Notes (Signed)
[name], [name] ([id number]) Visit Report for 08/21/2020 HPI Details Patient Name: Date of Service: [place] 08/21/2020 7:30 A M Medical Record Number: [id number] Patient Account Number: [id number] Date of Birth/Sex: Treating RN: 13-Aug-1941 (79 y.o. [name], [name] Primary Care Provider: [name]., [name] Other Clinician: Referring Provider: Treating Provider/Extender: [name], [name] in Treatment: 45 History of Present Illness HPI Description: ADMISSION 10/11/2019 This is a 79 year old man with a history of a severe cardiomyopathy with an ejection fraction of about 20%, chronic renal failure stage III. He is listed as a type II diabetic in epic although the patient denies this. He also has a history of PVD. He states for the last month he has had wounds on his bilateral lower extremities that started off as blisters which denuded. He has areas on the left lateral calf and 2 on the right lateral. He has an area on the left first met head which he did not know was there he we identified this on intake. He has been using Silvadene cream provided by his primary care physician but he is complaining that this burns. 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 He was acutely ill at that time complicating GI bleed. ABIs in our clinic were 1.16 on the right and 1.13 on the left 2/25; the patient comes in with his areas on the left lateral and right lateral calf. There is also an area over the left first MTP bunion deformity. We have been using Sorbact. His edema control is fairly good 3/4; left lateral  and right lateral calf. Most of his wounds are in the same position tightly adherent nonviable debris. On the right we debrided the superior wound on the left both wounds. The area on his bunion over the left first MTP medially is I think just about closed. We have been using Sorbact without a lot of success changed to Iodoflex under compression 3/11; left lateral and right lateral calf perhaps minor improvement in the surface condition. We have been using Iodoflex. The area over the bunion of the left first MTP has closed over 3/18; left lateral and right lateral calf not much improvement. We have been using Iodoflex. Aggressive debridement last week. 3/25; left lateral and right lateral calf. We have been using Iodoflex. There is some improvement in the superior area on the right and 2 on the lateral left although there is still a lot of debris on the surface. The inferior area on the right is still a completely nonviable surface. 4/8; left lateral and right lateral calfs. Essentially mirror-image looking wounds 2 wounds on each side in close juxtaposition we have been using Iodoflex with some improvement in the very adherent fibrinous debris but not a lot. The patient has arterial studies next Wednesday morning and venous studies next Thursday morning. I have been avoiding any further aggressive debridement until we see the arterial study results. His ABIs were fairly good in this clinic and is dorsalis pedis pulses are palpable but the wound beds are pale. 4/15; left lateral and right lateral calfs. Essentially mirror-image wounds with 2 wounds on each side in close juxtaposition  but with rims of separating normal tissue. We have been using Iodoflex. The base of the wounds has been cleaning up quite nicely ARTERIAL STUDIES were done showing the patient had an ABI on the right of 1.27 with triphasic waveforms and a TBI of 0.90 on the left triphasic waveforms with an ABI of 1.28 and a TBI of 0.87.  No evidence of arterial disease VENOUS REFLUX STUDIES; were done yesterday we do not have this report yet. 4/23; VENOUS REFLUX STUDIES did not show any significant reflux right lower extremity. No evidence of a DVT He did have significant reflux in the common . femoral vein on the left but nothing else was listed as significant. He did not have a DVT. We have been using Iodoflex under compression his wounds are making progress. 4/29; improvements in the wound surface continue. We have been using Iodoflex. He has a 20% out-of-pocket co-pay for Apligraf which is unfortunate. We changed him to Sorbact today 5/6; started on Sorbact last week. Better looking wound surface but not much change in dimensions. 01/24/20-Patient is back at 3 weeks, wound surfaces are about the same but very minimal slough on the right leg wounds, however there is blistering on both legs adjacent to the wounds, patient denies any other symptoms or new symptoms. His studies have been reviewed and do not indicate any significant arterial or venous disease. But he is attending once in 3 weeks with home health changing in between 6/3; patient has 4 wounds 2 on each side of his lateral lower legs. These wounds are somewhat improved. He apparently arrived in clinic last week with a large blister on the right lateral lower leg that had denuded into the wound. They changed to silver alginate. Still under compression. He has straight Medicare and unfortunately has an unlimited co-pay for advanced treatment options. 6/10; his original 4 wounds are all better especially on the left lateral lower leg. 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 6/24; blister on the right lateral lower leg has healed. There is no additional wounds. The traumatic left second toe is also closed. The 4 that he has are all better 7/1; all of the patient's wounds are smaller except for the proximal one on the left lateral calf. We are using silver collagen 7/22; patient has home health. Relatively refractory wounds on the lateral part of both mid calfs. Each of them 2 wounds. The areas on the left are doing much better in fact the distal 1 looks like it is on his way to closure. The right required debridement with adherent fibrinous debris under illumination. We have been using silver collagen. Previous arterial studies were within normal limits he also had venous reflux studies 04/03/2020 upon evaluation today patient appears to be doing excellent with regard to his lower extremity ulcers. He is going to require a little bit of debridement on the wounds on the right especially in order to clear away some biofilm and slough but this is minimal and overall he seems to be doing quite well. 8/19; the patient's wounds on the left lateral lower leg just above closed. However the 2  on the lateral part of the right have a nonviable necrotic surface. This is disappointing. There is also no change in dimensions. He has had arterial studies as well as venous reflux studies 8/26; very disappointing today. Even the more superficial areas on the left are about the same as last week. Still 2 punched-out areas completely unchanged on the right. We have been using silver collagen really making no progress. 9/2; changed to Iodoflex last week better looking wound surfaces especially on the right although they are deep and punched out. The areas on the left are more superficial open 1 of these is almost fully epithelialized although it has been this way for at least 2 weeks. He has a 20% co-pay [Medicare only] unaffordable for a skin  substitute. Had some thought about a snap VAC on the deep areas on the right leg we will try to put this through in the insurance 9/9; punched-out wounds on the bilateral lateral lower extremities. We have been operating as if the these were chronic venous wounds with secondary lymphedema. The areas on the left have been doing well in fact one of them is closed over. We have had no improvement in the areas on the right we have been using Iodoflex to help with surface debridement. The areas on the right are deeper wider. I do not see any evidence of infection. Home health is not been putting the wraps on high enough he has localized lymphedema right above the wounds. He has had arterial and venous studies already 9/16; punched-out mirror-image wounds on his bilateral lateral mid calfs. We have 1 closed on the left lateral the other smaller. For the first time today the areas on the right lateral actually looks some better. I did a biopsy of 1 of these last time although we still do not have this result. HOWEVER he arrives in clinic today complaining of chest pain overnight which felt like a brick on his chest. He also had 2 black watery bowel movements. He does not have nausea or vomiting. He is not describing abdominal pain. He has no prior history of diarrhea heartburn etc. 9/30; sent this patient to the ER on 9/16. 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 He is on twice daily PPIs Protonix. His admission hemoglobin was exceptionally low at 5. 8.. Discharged at 8.1. 4 unit transfusion. 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 His diuretic dose was actually decreased because of elevated creatinine currently at torsemide 20 mg not felt to be a Coumadin candidate. Patient states he feels well. The areas on his left leg are healed. The areas on the right look better. These have been very refractory  wounds. He does not have stockings we will make arrangements for this today 10/7; the areas on the left leg are healed we will transition him into his 20/30 stocking today. Continued improvement in the areas on the right lateral leg 10/21; the areas on the left leg remain healed at this 2-week follow-up however there is a history that he developed some drainage from one of the wound sites with home health therefore going ahead and wrapping him. He was supposed to be on 20/30 stockings although the history here is vague and I am not sure that this represents a stocking failure or not. We have been wrapping his right leg with the one remaining wound is the distal wound. We  have been using silver collagen 10/28 the patient only has 1 wound remaining on the right lower calf. This still has 3 mm of depth which is unchanged but the surface area is smaller. The superior wound on the right lateral is closed. The 2 wounds on the left remain closed and he is using his compression stocking 11/4 1 remaining wound on the right lower calf. Under illumination not a viable surface we have been using silver collagen we had good effect on the other areas but this 1 appears to be stalled. There is no open area on the left leg he is using his own stocking. 11/11; 1 remaining wound on the right lower calf. Surface of this is a lot better than last week but still requiring debridement I am using Iodoflex but looking forward to changing the primary dressing to perhaps endoform 11/18; 1 remaining wound on the right lower leg, venous insufficiency, vigorous debridement last week/Iodoflex. Depth of the wound is therefore larger but the wound looks clean. Still very gritty material at the surface requiring debridement 12/2; the remaining wound on his right lower leg, venous insufficiency. I changed him to endoform 2 weeks ago. We appear to be making nice progress. Wound is measuring smaller 12/16; he comes in today with one of  his two wounds on the left opened infected second area on the left may be threatened as well as the healed one on the right. He has edema in both legs which I think is pitting. He says his weights are stable he has unknown fairly severe cardiomyopathy. His cardiologist is Dr. Brigitte Pulse it does not sound like he is really been carefully followed 12/23; the areas that I was concerned about on the left look a lot better we put some silver alginate and put him back in compression. It may be the compression that actually does the trick here. The remaining open area we have been using Iodoflex and again this looks a lot better. At our suggestion he went to see his primary who did not adjust his diuretic he is still apparently taking 20 mg of a loop diuretic and "sometimes" 40 mg is basically what the patient stated Electronic Signature(s) Signed: 08/21/2020 3:50:51 PM By: Linton Ham MD Entered By: Linton Ham on 08/21/2020 08:20:36 -------------------------------------------------------------------------------- Physical Exam Details Patient Name: Date of Service: [place], [place] 08/21/2020 7:30 [place] Record Number: [id number] Patient Account Number: [id number] Date of Birth/Sex: Treating RN: [birth year] (79 y.o. [name], [name] Primary Care Provider: [name]., [name] Other Clinician: Referring Provider: Treating Provider/Extender: [name], [name] in Treatment: 20 Constitutional Sitting or standing Blood Pressure is within target range for patient.. Pulse regular and within target range for patient.Marland Kitchen Respirations regular, non-labored and within target range.. Temperature is normal and within the target range for the patient.Marland Kitchen Appears in no distress. Cardiovascular Pedal pulses are palpable. Edema control is good. Notes Wound exam; right lateral lower leg wound looks a lot better. I wash this off with Anasept and gauze could certainly did not require  mechanical debridement. Left lateral lower leg all of these areas look a lot better some surface debris very superficial again washed off with Anasept and gauze but no mechanical debridement. There is no evidence of infection his edema control looks a lot better Electronic Signature(s) Signed: 08/21/2020 3:50:51 PM By: Linton Ham MD Entered By: Linton Ham on 08/21/2020 08:22:06 -------------------------------------------------------------------------------- Physician Orders Details Patient Name: Date of  Service: [place], [place] 08/21/2020 7:30 A M Medical Record Number: [id number] Patient Account Number: [id number] Date of Birth/Sex: Treating RN: 10-30-1940 (79 y.o. [name], [name] Primary Care Provider: [name]., [name] Other Clinician: Referring Provider: Treating Provider/Extender: [name], [name] in Treatment: 50 Verbal / Phone Orders: No Diagnosis Coding ICD-10 Coding Code Description [phone] Chronic venous hypertension (idiopathic) with inflammation of bilateral lower extremity I89.0 Lymphedema, not elsewhere classified L97.822 Non-pressure chronic ulcer of other part of left lower leg with fat layer exposed L97.812 Non-pressure chronic ulcer of other part of right lower leg with fat layer exposed S90.812D Abrasion, left foot, subsequent encounter I42.9 Cardiomyopathy, unspecified K92.1 Melena Follow-up Appointments Return Appointment in 2 weeks. Bathing/ Shower/ Hygiene May shower with protection but do not get wound dressing(s) wet. May shower and wash wound with soap and water. - with dressing changes only. Edema Control - Lymphedema / SCD / Other Elevate legs to the level of the heart or above for 30 minutes daily and/or when sitting, a frequency of: - 3-4 times a day throughout the day. Avoid standing for long periods of time. Exercise regularly Joshua wound care orders this week; continue Home Health for  wound care. May utilize formulary equivalent dressing for wound treatment orders unless otherwise specified. - [facility] home [facility]. Wound Treatment Wound #10 - Lower Leg Wound Laterality: Left, Lateral Cleanser: Soap and Water (Home Health) 2 x Per Week/15 Days Discharge Instructions: May shower and wash wound with dial antibacterial soap and water with dressing changes only. Peri-Wound Care: Sween Lotion (Moisturizing lotion) (Home Health) 2 x Per Week/15 Days Discharge Instructions: Apply moisturizing lotion as directed Prim Dressing: KerraCel Ag Gelling Fiber Dressing, 2x2 in (silver alginate) (Home Health) 2 x Per Week/15 Days ary Discharge Instructions: Apply silver alginate to wound bed as instructed Secondary Dressing: Woven Gauze Sponge, Non-Sterile 4x4 in (Home Health) 2 x Per Week/15 Days Compression Wrap: FourPress (4 layer compression wrap) (Home Health) 2 x Per Week/15 Days Discharge Instructions: Use first layer of unna boot to upper portion of lower leg. Apply four layer compression just above toes and just below the knee. Wound #4 - Lower Leg Wound Laterality: Right, Lateral, Distal Cleanser: Soap and Water (Home Health) 2 x Per Week/15 Days Discharge Instructions: May shower and wash wound with dial antibacterial soap and water with dressing changes only. Peri-Wound Care: Sween Lotion (Moisturizing lotion) (Home Health) 2 x Per Week/15 Days Discharge Instructions: Apply moisturizing lotion as directed Prim Dressing: KerraCel Ag Gelling Fiber Dressing, 2x2 in (silver alginate) (Home Health) 2 x Per Week/15 Days ary Discharge Instructions: Apply silver alginate to wound bed as instructed Secondary Dressing: Woven Gauze Sponge, Non-Sterile 4x4 in (Home Health) 2 x Per Week/15 Days Compression Wrap: FourPress (4 layer compression wrap) (Home Health) 2 x Per Week/15 Days Discharge Instructions: Use first layer of unna boot to upper portion of lower leg. Apply four layer  compression just above toes and just below the knee. Wound #9 - Lower Leg Wound Laterality: Left, Anterior Cleanser: Soap and Water (Home Health) 2 x Per Week/15 Days Discharge Instructions: May shower and wash wound with dial antibacterial soap and water with dressing changes only. Peri-Wound Care: Sween Lotion (Moisturizing lotion) (Home Health) 2 x Per Week/15 Days Discharge Instructions: Apply moisturizing lotion as directed Prim Dressing: KerraCel Ag Gelling Fiber Dressing, 2x2 in (silver alginate) (Home Health) 2 x Per Week/15 Days ary Discharge Instructions: Apply silver  alginate to wound bed as instructed Secondary Dressing: Woven Gauze Sponge, Non-Sterile 4x4 in (Home Health) 2 x Per Week/15 Days Compression Wrap: FourPress (4 layer compression wrap) (Home Health) 2 x Per Week/15 Days Discharge Instructions: Use first layer of unna boot to upper portion of lower leg. Apply four layer compression just above toes and just below the knee. Electronic Signature(s) Signed: 08/21/2020 3:50:51 PM By: Linton Ham MD Signed: 08/21/2020 4:19:56 PM By: Deon Pilling Entered By: Deon Pilling on 08/21/2020 07:57:20 -------------------------------------------------------------------------------- Problem List Details Patient Name: Date of Service: [place] 08/21/2020 7:30 A M Medical Record Number: [id number] Patient Account Number: [id number] Date of Birth/Sex: Treating RN: 1941/02/26 (79 y.o. [name], [name] Primary Care Provider: [name]., [name] Other Clinician: Referring Provider: Treating Provider/Extender: [name], [name] in Treatment: 45 Active Problems ICD-10 Encounter Code Description Active Date MDM Diagnosis I87.323 Chronic venous hypertension (idiopathic) with inflammation of bilateral lower 10/11/2019 No Yes extremity I89.0 Lymphedema, not elsewhere classified 10/11/2019 No Yes L97.822 Non-pressure chronic ulcer of other  part of left lower leg with fat layer exposed2/06/2020 No Yes L97.812 Non-pressure chronic ulcer of other part of right lower leg with fat layer 01/03/2020 No Yes exposed S90.812D Abrasion, left foot, subsequent encounter 02/07/2020 No Yes I42.9 Cardiomyopathy, unspecified 05/15/2020 No Yes K92.1 Melena 05/15/2020 No Yes Inactive Problems ICD-10 Code Description Active Date Inactive Date L97.521 Non-pressure chronic ulcer of other part of left foot limited to breakdown of skin 10/11/2019 10/11/2019 Resolved Problems ICD-10 Code Description Active Date Resolved Date 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 10/11/2019 10/11/2019 Electronic Signature(s) Signed: 08/21/2020 3:50:51 PM By: Linton Ham MD Entered By: Linton Ham on 08/21/2020 08:16:48 -------------------------------------------------------------------------------- Progress Note Details Patient Name: Date of Service: [place] 08/21/2020 7:30 A M Medical Record Number: [id number] Patient Account Number: [id number] Date of Birth/Sex: Treating RN: 1941-07-26 (79 y.o. [name], [name] Primary Care Provider: [name]., [name] Other Clinician: Referring Provider: Treating Provider/Extender: [name], [name] in Treatment: 35 Subjective History of Present Illness (HPI) ADMISSION 10/11/2019 This is a 78 year old man with a history of a severe cardiomyopathy with an ejection fraction of about 20%, chronic renal failure stage III. He is listed as a type II diabetic in epic although the patient denies this. He also has a history of PVD. He states for the last month he has had wounds on his bilateral lower extremities that started off as blisters which denuded. He has areas on the left lateral calf and 2 on the right lateral. He has an area on the left first met head which he did not know was there he we identified this on intake. He has been using Silvadene cream  provided by his primary care physician but he is complaining that this burns. 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 He was acutely ill at that time complicating GI bleed. ABIs in our clinic were 1.16 on the right and 1.13 on the left 2/25; the patient comes in with his areas on the left lateral and right lateral calf. There  is also an area over the left first MTP bunion deformity. We have been using Sorbact. His edema control is fairly good 3/4; left lateral and right lateral calf. Most of his wounds are in the same position tightly adherent nonviable debris. On the right we debrided the superior wound on the left both wounds. The area on his bunion over the left first MTP medially is I think just about closed. We have been using Sorbact without a lot of success changed to Iodoflex under compression 3/11; left lateral and right lateral calf perhaps minor improvement in the surface condition. We have been using Iodoflex. The area over the bunion of the left first MTP has closed over 3/18; left lateral and right lateral calf not much improvement. We have been using Iodoflex. Aggressive debridement last week. 3/25; left lateral and right lateral calf. We have been using Iodoflex. There is some improvement in the superior area on the right and 2 on the lateral left although there is still a lot of debris on the surface. The inferior area on the right is still a completely nonviable surface. 4/8; left lateral and right lateral calfs. Essentially mirror-image looking wounds 2 wounds on each side in close juxtaposition we have been using Iodoflex  with some improvement in the very adherent fibrinous debris but not a lot. The patient has arterial studies next Wednesday morning and venous studies next Thursday morning. I have been avoiding any further aggressive debridement until we see the arterial study results. His ABIs were fairly good in this clinic and is dorsalis pedis pulses are palpable but the wound beds are pale. 4/15; left lateral and right lateral calfs. Essentially mirror-image wounds with 2 wounds on each side in close juxtaposition but with rims of separating normal tissue. We have been using Iodoflex. The base of the wounds has been cleaning up quite nicely ARTERIAL STUDIES were done showing the patient had an ABI on the right of 1.27 with triphasic waveforms and a TBI of 0.90 on the left triphasic waveforms with an ABI of 1.28 and a TBI of 0.87. No evidence of arterial disease VENOUS REFLUX STUDIES; were done yesterday we do not have this report yet. 4/23; VENOUS REFLUX STUDIES did not show any significant reflux right lower extremity. No evidence of a DVT He did have significant reflux in the common . femoral vein on the left but nothing else was listed as significant. He did not have a DVT. We have been using Iodoflex under compression his wounds are making progress. 4/29; improvements in the wound surface continue. We have been using Iodoflex. He has a 20% out-of-pocket co-pay for Apligraf which is unfortunate. We changed him to Sorbact today 5/6; started on Sorbact last week. Better looking wound surface but not much change in dimensions. 01/24/20-Patient is back at 3 weeks, wound surfaces are about the same but very minimal slough on the right leg wounds, however there is blistering on both legs adjacent to the wounds, patient denies any other symptoms or new symptoms. His studies have been reviewed and do not indicate any significant arterial or venous disease. But he is attending once in 3 weeks with home health  changing in between 6/3; patient has 4 wounds 2 on each side of his lateral lower legs. These wounds are somewhat improved. He apparently arrived in clinic last week with a large blister on the right lateral lower leg that had denuded into the wound. They changed to silver alginate. Still under compression. He has  straight Medicare and unfortunately has an unlimited co-pay for advanced treatment options. 6/10; his original 4 wounds are all better especially on the left lateral lower leg. 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 6/24; blister on the right lateral lower leg has healed. There is no additional wounds. The traumatic left second toe is also closed. The 4 that he has are all better 7/1; all of the patient's wounds are smaller except for the proximal one on the left lateral calf. We are using silver collagen 7/22; patient has home health. Relatively refractory wounds on the lateral part of both mid calfs. Each of them 2 wounds. The areas on the left are doing much better in fact the distal 1 looks like it is on his way to closure. The right required debridement with adherent fibrinous debris under illumination. We have been using silver collagen. Previous arterial studies were within normal limits he also had venous reflux studies 04/03/2020 upon evaluation today patient appears to be doing excellent with regard to his lower extremity ulcers. He is going to require a little bit of debridement on the wounds on the right especially in order to clear away some biofilm and slough but this is minimal and overall he seems to be doing quite well. 8/19; the  patient's wounds on the left lateral lower leg just above closed. However the 2 on the lateral part of the right have a nonviable necrotic surface. This is disappointing. There is also no change in dimensions. He has had arterial studies as well as venous reflux studies 8/26; very disappointing today. Even the more superficial areas on the left are about the same as last week. Still 2 punched-out areas completely unchanged on the right. We have been using silver collagen really making no progress. 9/2; changed to Iodoflex last week better looking wound surfaces especially on the right although they are deep and punched out. The areas on the left are more superficial open 1 of these is almost fully epithelialized although it has been this way for at least 2 weeks. He has a 20% co-pay [Medicare only] unaffordable for a skin substitute. Had some thought about a snap VAC on the deep areas on the right leg we will try to put this through in the insurance 9/9; punched-out wounds on the bilateral lateral lower extremities. We have been operating as if the these were chronic venous wounds with secondary lymphedema. The areas on the left have been doing well in fact one of them is closed over. We have had no improvement in the areas on the right we have been using Iodoflex to help with surface debridement. The areas on the right are deeper wider. I do not see any evidence of infection. Home health is not been putting the wraps on high enough he has localized lymphedema right above the wounds. He has had arterial and venous studies already 9/16; punched-out mirror-image wounds on his bilateral lateral mid calfs. We have 1 closed on the left lateral the other smaller. For the first time today the areas on the right lateral actually looks some better. I did a biopsy of 1  of these last time although we still do not have this result. HOWEVER he arrives in clinic today complaining of chest pain overnight which felt  like a brick on his chest. He also had 2 black watery bowel movements. He does not have nausea or vomiting. He is not describing abdominal pain. He has no prior history of diarrhea heartburn etc. 9/30; sent this patient to the ER on 9/16. 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 He is on twice daily PPIs Protonix. His admission hemoglobin was exceptionally low at 5. 8.. Discharged at 8.1. 4 unit transfusion. 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 His diuretic dose was actually decreased because of elevated creatinine currently at torsemide 20 mg not felt to be a Coumadin candidate. Patient states he feels well. The areas on his left leg are healed. The areas on the right look better. These have been very refractory wounds. He does not have stockings we will make arrangements for this today 10/7; the areas on the left leg are healed we will transition him into his 20/30 stocking today. Continued improvement in the areas on the right lateral leg 10/21; the areas on the left leg remain healed at this 2-week follow-up however there is a history that he developed some drainage from one of the wound sites with home health therefore going ahead and wrapping him. He was supposed to be on 20/30 stockings although the history here is vague and I am not sure that this represents a stocking failure or not. We have been wrapping his right leg with the one remaining wound is the distal wound. We have been using silver collagen 10/28 the patient only has 1 wound remaining on the right lower calf. This still has 3 mm of depth which is unchanged but the surface area is smaller. The superior wound on the right lateral is closed. The 2 wounds on the left remain closed and he is using his compression stocking 11/4 1 remaining wound on the right lower calf. Under illumination not a viable surface we have been using  silver collagen we had good effect on the other areas but this 1 appears to be stalled. There is no open area on the left leg he is using his own stocking. 11/11; 1 remaining wound on the right lower calf. Surface of this is a lot better than last week but still requiring debridement I am using Iodoflex but looking forward to changing the primary dressing to perhaps endoform 11/18; 1 remaining wound on the right lower leg, venous insufficiency, vigorous debridement last week/Iodoflex. Depth of the wound is therefore larger but the wound looks clean. Still very gritty material at the surface requiring debridement 12/2; the remaining wound on his right lower leg, venous insufficiency. I changed him to endoform 2 weeks ago. We appear to be making nice progress. Wound is measuring smaller 12/16; he comes in today with one of his two wounds on the left opened infected second area on the left may be threatened as well as the healed one on the right. He has edema in both legs which I think is pitting. He says his weights are stable he has unknown fairly severe cardiomyopathy. His cardiologist is Dr. Brigitte Pulse it does not sound like he is really been carefully followed 12/23; the areas that I was concerned about on the left look a lot better we put some  silver alginate and put him back in compression. It may be the compression that actually does the trick here. The remaining open area we have been using Iodoflex and again this looks a lot better. At our suggestion he went to see his primary who did not adjust his diuretic he is still apparently taking 20 mg of a loop diuretic and "sometimes" 40 mg is basically what the patient stated Objective Constitutional Sitting or standing Blood Pressure is within target range for patient.. Pulse regular and within target range for patient.Marland Kitchen Respirations regular, non-labored and within target range.. Temperature is normal and within the target range for the patient.Marland Kitchen  Appears in no distress. Vitals Time Taken: 7:45 AM, Height: 71 in, Weight: 198 lbs, BMI: 27.6, Temperature: 97.8 F, Pulse: 94 bpm, Respiratory Rate: 20 breaths/min, Blood Pressure: 124/76 mmHg. Cardiovascular Pedal pulses are palpable. Edema control is good. General Notes: Wound exam; right lateral lower leg wound looks a lot better. I wash this off with Anasept and gauze could certainly did not require mechanical debridement. Left lateral lower leg all of these areas look a lot better some surface debris very superficial again washed off with Anasept and gauze but no mechanical debridement. There is no evidence of infection his edema control looks a lot better Integumentary (Hair, Skin) Wound #10 status is Open. Original cause of wound was Gradually Appeared. The wound is located on the Left,Lateral Lower Leg. The wound measures 0.9cm length x 0.7cm width x 0.1cm depth; 0.495cm^2 area and 0.049cm^3 volume. There is Fat Layer (Subcutaneous Tissue) exposed. There is no tunneling or undermining noted. There is a medium amount of serosanguineous drainage noted. There is medium (34-66%) pink, pale granulation within the wound bed. There is a medium (34-66%) amount of necrotic tissue within the wound bed including Adherent Slough. Wound #4 status is Open. Original cause of wound was Blister. The wound is located on the Right,Distal,Lateral Lower Leg. The wound measures 1.6cm length x 0.7cm width x 0.1cm depth; 0.88cm^2 area and 0.088cm^3 volume. There is Fat Layer (Subcutaneous Tissue) exposed. There is no tunneling or undermining noted. There is a medium amount of serosanguineous drainage noted. The wound margin is well defined and not attached to the wound base. There is medium (34-66%) pink granulation within the wound bed. There is a medium (34-66%) amount of necrotic tissue within the wound bed including Adherent Slough. Wound #9 status is Open. Original cause of wound was Gradually Appeared. The  wound is located on the Left,Anterior Lower Leg. The wound measures 0.1cm length x 0.1cm width x 0.1cm depth; 0.008cm^2 area and 0.001cm^3 volume. There is Fat Layer (Subcutaneous Tissue) exposed. There is no tunneling or undermining noted. There is a medium amount of purulent drainage noted. The wound margin is distinct with the outline attached to the wound base. There is large (67-100%) red, pink, pale granulation within the wound bed. There is a small (1-33%) amount of necrotic tissue within the wound bed including Adherent Slough. Assessment Active Problems ICD-10 Chronic venous hypertension (idiopathic) with inflammation of bilateral lower extremity Lymphedema, not elsewhere classified Non-pressure chronic ulcer of other part of left lower leg with fat layer exposed Non-pressure chronic ulcer of other part of right lower leg with fat layer exposed Abrasion, left foot, subsequent encounter Cardiomyopathy, unspecified Melena Procedures Wound #10 Pre-procedure diagnosis of Wound #10 is a Venous Leg Ulcer located on the Left,Lateral Lower Leg . There was a Four Layer Compression Therapy Procedure by Rhae Hammock, RN. Post procedure Diagnosis Wound #  10: Same as Pre-Procedure Wound #4 Pre-procedure diagnosis of Wound #4 is a Venous Leg Ulcer located on the Right,Distal,Lateral Lower Leg . There was a Four Layer Compression Therapy Procedure by Rhae Hammock, RN. Post procedure Diagnosis Wound #4: Same as Pre-Procedure Wound #9 Pre-procedure diagnosis of Wound #9 is a Venous Leg Ulcer located on the Left,Anterior Lower Leg . There was a Four Layer Compression Therapy Procedure by Rhae Hammock, RN. Post procedure Diagnosis Wound #9: Same as Pre-Procedure Plan Follow-up Appointments: Return Appointment in 2 weeks. Bathing/ Shower/ Hygiene: May shower with protection but do not get wound dressing(s) wet. May shower and wash wound with soap and water. - with dressing changes  only. Edema Control - Lymphedema / SCD / Other: Elevate legs to the level of the heart or above for 30 minutes daily and/or when sitting, a frequency of: - 3-4 times a day throughout the day. Avoid standing for long periods of time. Exercise regularly Home Health: New wound care orders this week; continue Home Health for wound care. May utilize formulary equivalent dressing for wound treatment orders unless otherwise specified. - [facility] home [facility]. WOUND #10: - Lower Leg Wound Laterality: Left, Lateral Cleanser: Soap and Water (Home Health) 2 x Per Week/15 Days Discharge Instructions: May shower and wash wound with dial antibacterial soap and water with dressing changes only. Peri-Wound Care: Sween Lotion (Moisturizing lotion) (Home Health) 2 x Per Week/15 Days Discharge Instructions: Apply moisturizing lotion as directed Prim Dressing: KerraCel Ag Gelling Fiber Dressing, 2x2 in (silver alginate) (Home Health) 2 x Per Week/15 Days ary Discharge Instructions: Apply silver alginate to wound bed as instructed Secondary Dressing: Woven Gauze Sponge, Non-Sterile 4x4 in (Home Health) 2 x Per Week/15 Days Com pression Wrap: FourPress (4 layer compression wrap) (Home Health) 2 x Per Week/15 Days Discharge Instructions: Use first layer of unna boot to upper portion of lower leg. Apply four layer compression just above toes and just below the knee. WOUND #4: - Lower Leg Wound Laterality: Right, Lateral, Distal Cleanser: Soap and Water (Home Health) 2 x Per Week/15 Days Discharge Instructions: May shower and wash wound with dial antibacterial soap and water with dressing changes only. Peri-Wound Care: Sween Lotion (Moisturizing lotion) (Home Health) 2 x Per Week/15 Days Discharge Instructions: Apply moisturizing lotion as directed Prim Dressing: KerraCel Ag Gelling Fiber Dressing, 2x2 in (silver alginate) (Home Health) 2 x Per Week/15 Days ary Discharge Instructions: Apply silver alginate to wound  bed as instructed Secondary Dressing: Woven Gauze Sponge, Non-Sterile 4x4 in (Home Health) 2 x Per Week/15 Days Com pression Wrap: FourPress (4 layer compression wrap) (Home Health) 2 x Per Week/15 Days Discharge Instructions: Use first layer of unna boot to upper portion of lower leg. Apply four layer compression just above toes and just below the knee. WOUND #9: - Lower Leg Wound Laterality: Left, Anterior Cleanser: Soap and Water (Home Health) 2 x Per Week/15 Days Discharge Instructions: May shower and wash wound with dial antibacterial soap and water with dressing changes only. Peri-Wound Care: Sween Lotion (Moisturizing lotion) (Home Health) 2 x Per Week/15 Days Discharge Instructions: Apply moisturizing lotion as directed Prim Dressing: KerraCel Ag Gelling Fiber Dressing, 2x2 in (silver alginate) (Home Health) 2 x Per Week/15 Days ary Discharge Instructions: Apply silver alginate to wound bed as instructed Secondary Dressing: Woven Gauze Sponge, Non-Sterile 4x4 in (Home Health) 2 x Per Week/15 Days Com pression Wrap: FourPress (4 layer compression wrap) (Home Health) 2 x Per Week/15 Days Discharge Instructions: Use first  layer of unna boot to upper portion of lower leg. Apply four layer compression just above toes and just below the knee. 1. I am hopeful that Dr.'s assessment of fluid volume overload was accurate 2. His edema control today is better in his legs but we have not been able to maintain this with compression stockings 3. Both wound areas look good. No change in the primary dressings which is silver alginate on the left and Iodoflex on the right 4. Back in 2 weeks. He has home Chiropractor) Signed: 08/21/2020 3:50:51 PM By: Linton Ham MD Entered By: Linton Ham on 08/21/2020 08:23:08 -------------------------------------------------------------------------------- SuperBill Details Patient Name: Date of Service: [place] [facility]  08/21/2020 Medical Record Number: [id number] Patient Account Number: [id number] Date of Birth/Sex: Treating RN: 1941/06/17 (79 y.o. [name], [name] Primary Care Provider: [name]., [name] Other Clinician: Referring Provider: Treating Provider/Extender: [name], [name] in Treatment: 45 Diagnosis Coding ICD-10 Codes Code Description [id number] Chronic venous hypertension (idiopathic) with inflammation of bilateral lower extremity I89.0 Lymphedema, not elsewhere classified L97.822 Non-pressure chronic ulcer of other part of left lower leg with fat layer exposed L97.812 Non-pressure chronic ulcer of other part of right lower leg with fat layer exposed S90.812D Abrasion, left foot, subsequent encounter I42.9 Cardiomyopathy, unspecified K92.1 [facility] Facility Procedures Physician Procedures : CPT4 Code Description Modifier [id number] - WC PHYS LEVEL 3 - EST PT ICD-10 Diagnosis Description L97.822 Non-pressure chronic ulcer of other part of left lower leg with fat layer exposed L97.812 Non-pressure chronic ulcer of other part of right  lower leg with fat layer exposed I87.323 Chronic venous hypertension (idiopathic) with inflammation of bilateral lower extremity Quantity: 1 Electronic Signature(s) Signed: 08/21/2020 3:50:51 PM By: Linton Ham MD Entered By: Linton Ham on 08/21/2020 08:23:46

## 2020-08-27 NOTE — Progress Notes (Signed)
[name], [name] ([id number]) Visit Report for 08/21/2020 Arrival Information Details Patient Name: Date of Service: [place] 08/21/2020 7:30 A M Medical Record Number: [id number] Patient Account Number: [id number] Date of Birth/Sex: Treating RN: April 27, 1941 (79 y.o. [name] Primary Care Kellyn Mccary: [name]., [name] Other Clinician: Referring Audreyanna Butkiewicz: Treating Diana Davenport/Extender: [name], [name] in Treatment: 92 Visit Information History Since Last Visit Added or deleted any medications: No Patient Arrived: Ambulatory Any new allergies or adverse reactions: No Arrival Time: 07:40 Had a fall or experienced change in No Accompanied By: self activities of daily living that may affect Transfer Assistance: None risk of falls: Patient Identification Verified: Yes Signs or symptoms of abuse/neglect since last visito No Secondary Verification Process Completed: Yes Hospitalized since last visit: No Patient Requires Transmission-Based Precautions: No Implantable device outside of the clinic excluding No Patient Has Alerts: Yes cellular tissue based products placed in the center Patient Alerts: Patient on Blood Thinner since last visit: Has Dressing in Place as Prescribed: Yes Has Compression in Place as Prescribed: Yes Pain Present Now: No Electronic Signature(s) Signed: 08/21/2020 4:19:56 PM By: Deon Pilling Entered By: Deon Pilling on 08/21/2020 07:43:44 -------------------------------------------------------------------------------- Compression Therapy Details Patient Name: Date of Service: [place] [name] [facility] 08/21/2020 7:30 [place] Record Number: [id number] Patient Account Number: [id number] Date of Birth/Sex: Treating RN: [birth year] (79 y.o. [name] Primary Care Georgie Haque: [name]., [name] Other Clinician: Referring Kurstyn Larios: Treating Nancy Arvin/Extender: [name], [name] in  Treatment: 45 Compression Therapy Performed for Wound Assessment: Wound #10 Left,Lateral Lower Leg Performed By: Clinician Rhae Hammock, RN Compression Type: Four Layer Post Procedure Diagnosis Same as Pre-procedure Electronic Signature(s) Signed: 08/21/2020 4:19:56 PM By: Deon Pilling Entered By: Deon Pilling on 08/21/2020 07:55:57 -------------------------------------------------------------------------------- Compression Therapy Details Patient Name: Date of Service: [place] [name] [facility] 08/21/2020 7:30 A M Medical Record Number: [id number] Patient Account Number: [id number] Date of Birth/Sex: Treating RN: 1941-05-19 (79 y.o. [name] Primary Care Journi Moffa: [name]., [name] Other Clinician: Referring Jaclin Finks: Treating Anand Tejada/Extender: [name], [name] in Treatment: 45 Compression Therapy Performed for Wound Assessment: Wound #4 Right,Distal,Lateral Lower Leg Performed By: Clinician Rhae Hammock, RN Compression Type: Four Layer Post Procedure Diagnosis Same as Pre-procedure Electronic Signature(s) Signed: 08/21/2020 4:19:56 PM By: Deon Pilling Entered By: Deon Pilling on 08/21/2020 07:55:57 -------------------------------------------------------------------------------- Compression Therapy Details Patient Name: Date of Service: [place] [name] [facility] 08/21/2020 7:30 A M Medical Record Number: [id number] Patient Account Number: [id number] Date of Birth/Sex: Treating RN: 31-Dec-1940 (79 y.o. [name] Primary Care Lashika Erker: [name]., [name] Other Clinician: Referring Primrose Oler: Treating Tanyon Alipio/Extender: [name], [name] in Treatment: 45 Compression Therapy Performed for Wound Assessment: Wound #9 Left,Anterior Lower Leg Performed By: Clinician Rhae Hammock, RN Compression Type: Four Layer Post Procedure Diagnosis Same as Pre-procedure Electronic Signature(s) Signed:  08/21/2020 4:19:56 PM By: Deon Pilling Entered By: Deon Pilling on 08/21/2020 07:55:57 -------------------------------------------------------------------------------- Encounter Discharge Information Details Patient Name: Date of Service: [place] 08/21/2020 7:30 [place] Record Number: [id number] Patient Account Number: [id number] Date of Birth/Sex: Treating RN: 1941/05/05 (79 y.o. [name], Lauren Primary Care Mellissa Conley: [name]., [name] Other Clinician: Referring Becki Mccaskill: Treating Cristy Colmenares/Extender: [name], [name] in Treatment: 87 Encounter Discharge Information Items Discharge Condition: Stable  Ambulatory Status: Ambulatory Discharge Destination: Home Transportation: Other Accompanied By: self Schedule Follow-up Appointment: Yes Clinical Summary of Care: Patient Declined Electronic Signature(s) Signed: 08/27/2020 2:09:35 PM By: Rhae Hammock RN Entered By: Rhae Hammock on 08/21/2020 09:33:33 -------------------------------------------------------------------------------- Lower Extremity Assessment Details Patient Name: Date of Service: [place] 08/21/2020 7:30 A M Medical Record Number: [id number] Patient Account Number: [id number] Date of Birth/Sex: Treating RN: 04/30/1941 (79 y.o. [name], [name] Primary Care Zaela Graley: [name]., [name] Other Clinician: Referring Westlee Devita: Treating Serafina Topham/Extender: [name], [name] in Treatment: 45 Edema Assessment Assessed: [Left: Yes] Patrice [place]: Yes] Edema: [Left: Yes] [Right: Yes] Calf Left: Right: Point of Measurement: 53 cm From Medial Instep 41 cm 40 cm Ankle Left: Right: Point of Measurement: 14 cm From Medial Instep 21 cm 23 cm Knee To Floor Left: Right: From Medial Instep 51 cm 51 cm Vascular Assessment Pulses: Dorsalis Pedis Palpable: [Left:Yes] [Right:Yes] Posterior Tibial Palpable: [Left:Yes]  [Right:Yes] Electronic Signature(s) Signed: 08/21/2020 4:19:56 PM By: Deon Pilling Entered By: Deon Pilling on 08/21/2020 07:46:47 -------------------------------------------------------------------------------- Multi Wound Chart Details Patient Name: Date of Service: [place] 08/21/2020 7:30 A M Medical Record Number: [id number] Patient Account Number: [id number] Date of Birth/Sex: Treating RN: February 22, 1941 (79 y.o. [name], [name] Primary Care Phinneas Shakoor: [name]., [name] Other Clinician: Referring Alontae Chaloux: Treating Erynn Vaca/Extender: [name], [name] in Treatment: 45 Vital Signs Height(in): 71 Pulse(bpm): 94 Weight(lbs): 198 Blood Pressure(mmHg): 124/76 Body Mass Index(BMI): 28 Temperature(F): 97.8 Respiratory Rate(breaths/min): 20 Photos: [10:No Photos Left, Lateral Lower Leg] [4:No Photos Right, Distal, Lateral Lower Leg] [9:No Photos Left, Anterior Lower Leg] Wound Location: [10:Gradually Appeared] [4:Blister] [9:Gradually Appeared] Wounding Event: [10:Venous Leg Ulcer] [4:Venous Leg Ulcer] [9:Venous Leg Ulcer] Primary Etiology: [10:Anemia, Congestive Heart Failure,] [4:Anemia, Congestive Heart Failure,] [9:Anemia, Congestive Heart Failure,] Comorbid History: [10:Hypertension, Peripheral Venous Disease, End Stage Renal Disease Disease, End Stage Renal Disease Disease, End Stage Renal Disease 08/06/2020] [4:Hypertension, Peripheral Venous 09/11/2019] [9:Hypertension, Peripheral Venous  08/06/2020] Date Acquired: [10:1] [4:45] [9:1] Weeks of Treatment: [10:Open] [4:Open] [9:Open] Wound Status: [10:0.9x0.7x0.1] [4:1.6x0.7x0.1] [9:0.1x0.1x0.1] Measurements L x W x D (cm) [10:0.495] [4:0.88] [9:0.008] A (cm) : rea [10:0.049] [4:0.088] [9:0.001] Volume (cm) : [10:42.70%] [4:90.50%] [9:99.00%] % Reduction in Area: [10:43.00%] [4:90.50%] [9:98.70%] % Reduction in Volume: [10:Full Thickness Without Exposed] [4:Full Thickness Without  Exposed] [9:Full Thickness Without Exposed] Classification: [10:Support Structures Medium] [4:Support Structures Medium] [9:Support Structures Medium] Exudate Amount: [10:Serosanguineous] [4:Serosanguineous] [9:Purulent] Exudate Type: [10:red, brown] [4:red, brown] [9:yellow, brown, green] Exudate Color: [10:N/A] [4:Well defined, not attached] [9:Distinct, outline attached] Wound Margin: [10:Medium (34-66%)] [4:Medium (34-66%)] [9:Large (67-100%)] Granulation Amount: [10:Pink, Pale] [4:Pink] [9:Red, Pink, Pale] Granulation Quality: [10:Medium (34-66%)] [4:Medium (34-66%)] [9:Small (1-33%)] Necrotic Amount: [10:Fat Layer (Subcutaneous Tissue): Yes Fat Layer (Subcutaneous Tissue): Yes Fat Layer (Subcutaneous Tissue): Yes] Exposed Structures: [10:Fascia: No Tendon: No Muscle: No Joint: No Bone: No None] [4:Fascia: No Tendon: No Muscle: No Joint: No Bone: No Small (1-33%)] [9:Fascia: No Tendon: No Muscle: No Joint: No Bone: No Medium (34-66%)] Epithelialization: [10:Compression Therapy] [4:Compression Therapy] [9:Compression Therapy] Treatment Notes Electronic Signature(s) Signed: 08/21/2020 3:50:51 PM By: Linton Ham MD Signed: 08/21/2020 4:19:56 PM By: Deon Pilling Entered By: Linton Ham on 08/21/2020 08:16:53 -------------------------------------------------------------------------------- Multi-Disciplinary Care Plan Details Patient Name: Date of Service: [place] 08/21/2020 7:30 A M Medical Record Number: [id number] Patient Account Number: [id number] Date of Birth/Sex: Treating RN: 1941-05-28 (79 y.o. [name], [name] Primary  Care Khaleem Burchill: [name]., [name] Other Clinician: Referring Thresa Dozier: Treating Raguel Kosloski/Extender: [name], [name] in Treatment: 45 Active Inactive Venous Leg Ulcer Nursing Diagnoses: Knowledge deficit related to disease process and management Potential for venous Insuffiency (use before diagnosis  confirmed) Goals: Patient will maintain optimal edema control Date Initiated: 05/01/2020 Target Resolution Date: 09/18/2020 Goal Status: Active Interventions: Assess peripheral edema status every visit. Compression as ordered Provide education on venous insufficiency Notes: Electronic Signature(s) Signed: 08/21/2020 4:19:56 PM By: Deon Pilling Entered By: Deon Pilling on 08/21/2020 07:48:08 -------------------------------------------------------------------------------- Pain Assessment Details Patient Name: Date of Service: [place] [name] [facility] 08/21/2020 7:30 A M Medical Record Number: [id number] Patient Account Number: [id number] Date of Birth/Sex: Treating RN: [birth year] (79 y.o. [name], Lauren Primary Care Kinslee Dalpe: [name]., [name] Other Clinician: Referring Lauriel Helin: Treating Ramie Erman/Extender: [name], [name] in Treatment: 45 Active Problems Location of Pain Severity and Description of Pain Patient Has Paino No Site Locations Rate the pain. Current Pain Level: 0 Pain Management and Medication Current Pain Management: Electronic Signature(s) Signed: 08/27/2020 2:09:35 PM By: Rhae Hammock RN Entered By: Rhae Hammock on 08/21/2020 07:57:08 -------------------------------------------------------------------------------- Patient/Caregiver Education Details Patient Name: Date of Service: [place] 12/23/2021andnbsp7:30 [place] Record Number: [id number] Patient Account Number: [id number] Date of Birth/Gender: Treating RN: Jan 16, 1941 (79 y.o. [name] Primary Care Physician: [name]., [name] Other Clinician: Referring Physician: Treating Physician/Extender: [name], [name] in Treatment: 28 Education Assessment Education Provided To: Patient Education Topics Provided Venous: Handouts: Managing Venous Disease and Related Ulcers Methods: Explain/Verbal Responses:  Reinforcements needed Electronic Signature(s) Signed: 08/21/2020 4:19:56 PM By: Deon Pilling Entered By: Deon Pilling on 08/21/2020 07:48:20 -------------------------------------------------------------------------------- Wound Assessment Details Patient Name: Date of Service: [place] [name] [facility] 08/21/2020 7:30 A M Medical Record Number: [id number] Patient Account Number: [id number] Date of Birth/Sex: Treating RN: [birth year] (79 y.o. [name], [name] Primary Care Cas Tracz: [name]., [name] Other Clinician: Referring Shylo Dillenbeck: Treating Ladaja Yusupov/Extender: [name], [name] in Treatment: 60 Wound Status Wound Number: 10 Primary Venous Leg Ulcer Etiology: Wound Location: Left, Lateral Lower Leg Wound Open Wounding Event: Gradually Appeared Status: Date Acquired: 08/06/2020 Comorbid Anemia, Congestive Heart Failure, Hypertension, Peripheral Weeks Of Treatment: 1 History: Venous Disease, End Stage Renal Disease Clustered Wound: No Wound Measurements Length: (cm) 0.9 Width: (cm) 0.7 Depth: (cm) 0.1 Area: (cm) 0.495 Volume: (cm) 0.049 % Reduction in Area: 42.7% % Reduction in Volume: 43% Epithelialization: None Tunneling: No Undermining: No Wound Description Classification: Full Thickness Without Exposed Support Structures Exudate Amount: Medium Exudate Type: Serosanguineous Exudate Color: red, brown Foul Odor After Cleansing: No Slough/Fibrino Yes Wound Bed Granulation Amount: Medium (34-66%) Exposed Structure Granulation Quality: Pink, Pale Fascia Exposed: No Necrotic Amount: Medium (34-66%) Fat Layer (Subcutaneous Tissue) Exposed: Yes Necrotic Quality: Adherent Slough Tendon Exposed: No Muscle Exposed: No Joint Exposed: No Bone Exposed: No Treatment Notes Wound #10 (Lower Leg) Wound Laterality: Left, Lateral Cleanser Soap and Water Discharge Instruction: May shower and wash wound with dial antibacterial soap and water with  dressing changes only. Peri-Wound Care Sween Lotion (Moisturizing lotion) Discharge Instruction: Apply moisturizing lotion as directed Topical Primary Dressing KerraCel Ag Gelling Fiber Dressing, 2x2 in (silver alginate) Discharge Instruction: Apply silver alginate to wound bed as instructed Secondary Dressing Woven Gauze Sponge, Non-Sterile 4x4 in Secured With Compression Wrap FourPress (4 layer  compression wrap) Discharge Instruction: Use first layer of unna boot to upper portion of lower leg. Apply four layer compression just above toes and just below the knee. Compression Stockings Add-Ons Electronic Signature(s) Signed: 08/21/2020 4:19:56 PM By: Deon Pilling Entered By: Deon Pilling on 08/21/2020 07:53:21 -------------------------------------------------------------------------------- Wound Assessment Details Patient Name: Date of Service: [place] [name] [facility] 08/21/2020 7:30 A M Medical Record Number: [id number] Patient Account Number: [id number] Date of Birth/Sex: Treating RN: [birth year] (79 y.o. [name], [name] Primary Care Everest Hacking: [name]., [name] Other Clinician: Referring Tangie Stay: Treating Kaisyn Reinhold/Extender: [name], [name] in Treatment: 78 Wound Status Wound Number: 4 Primary Venous Leg Ulcer Etiology: Wound Location: Right, Distal, Lateral Lower Leg Wound Open Wounding Event: Blister Status: Date Acquired: 09/11/2019 Comorbid Anemia, Congestive Heart Failure, Hypertension, Peripheral Weeks Of Treatment: 45 History: Venous Disease, End Stage Renal Disease Clustered Wound: No Wound Measurements Length: (cm) 1.6 Width: (cm) 0.7 Depth: (cm) 0.1 Area: (cm) 0.88 Volume: (cm) 0.088 % Reduction in Area: 90.5% % Reduction in Volume: 90.5% Epithelialization: Small (1-33%) Tunneling: No Undermining: No Wound Description Classification: Full Thickness Without Exposed Support Structures Wound Margin: Well defined, not  attached Exudate Amount: Medium Exudate Type: Serosanguineous Exudate Color: red, brown Foul Odor After Cleansing: No Slough/Fibrino Yes Wound Bed Granulation Amount: Medium (34-66%) Exposed Structure Granulation Quality: Pink Fascia Exposed: No Necrotic Amount: Medium (34-66%) Fat Layer (Subcutaneous Tissue) Exposed: Yes Necrotic Quality: Adherent Slough Tendon Exposed: No Muscle Exposed: No Joint Exposed: No Bone Exposed: No Treatment Notes Wound #4 (Lower Leg) Wound Laterality: Right, Lateral, Distal Cleanser Soap and Water Discharge Instruction: May shower and wash wound with dial antibacterial soap and water with dressing changes only. Peri-Wound Care Sween Lotion (Moisturizing lotion) Discharge Instruction: Apply moisturizing lotion as directed Topical Primary Dressing KerraCel Ag Gelling Fiber Dressing, 2x2 in (silver alginate) Discharge Instruction: Apply silver alginate to wound bed as instructed Secondary Dressing Woven Gauze Sponge, Non-Sterile 4x4 in Secured With Compression Wrap FourPress (4 layer compression wrap) Discharge Instruction: Use first layer of unna boot to upper portion of lower leg. Apply four layer compression just above toes and just below the knee. Compression Stockings Add-Ons Electronic Signature(s) Signed: 08/21/2020 4:19:56 PM By: Deon Pilling Entered By: Deon Pilling on 08/21/2020 07:53:57 -------------------------------------------------------------------------------- Wound Assessment Details Patient Name: Date of Service: [place] [name] [facility] 08/21/2020 7:30 A M Medical Record Number: [id number] Patient Account Number: [id number] Date of Birth/Sex: Treating RN: 11/04/1940 (79 y.o. [name], [name] Primary Care Sudie Bandel: [name]., [name] Other Clinician: Referring Shunda Rabadi: Treating Alishia Lebo/Extender: [name], [name] in Treatment: 76 Wound Status Wound Number: 9 Primary Venous Leg  Ulcer Etiology: Wound Location: Left, Anterior Lower Leg Wound Open Wounding Event: Gradually Appeared Status: Date Acquired: 08/06/2020 Comorbid Anemia, Congestive Heart Failure, Hypertension, Peripheral Weeks Of Treatment: 1 History: Venous Disease, End Stage Renal Disease Clustered Wound: No Wound Measurements Length: (cm) 0.1 Width: (cm) 0.1 Depth: (cm) 0.1 Area: (cm) 0.008 Volume: (cm) 0.001 % Reduction in Area: 99% % Reduction in Volume: 98.7% Epithelialization: Medium (34-66%) Tunneling: No Undermining: No Wound Description Classification: Full Thickness Without Exposed Support Structures Wound Margin: Distinct, outline attached Exudate Amount: Medium Exudate Type: Purulent Exudate Color: yellow, brown, green Foul Odor After Cleansing: No Slough/Fibrino Yes Wound Bed Granulation Amount: Large (67-100%) Exposed Structure Granulation Quality: Red, Pink, Pale Fascia Exposed: No Necrotic Amount: Small (1-33%) Fat Layer (Subcutaneous Tissue) Exposed: Yes Necrotic Quality: Adherent Slough  Tendon Exposed: No Muscle Exposed: No Joint Exposed: No Bone Exposed: No Treatment Notes Wound #9 (Lower Leg) Wound Laterality: Left, Anterior Cleanser Soap and Water Discharge Instruction: May shower and wash wound with dial antibacterial soap and water with dressing changes only. Peri-Wound Care Sween Lotion (Moisturizing lotion) Discharge Instruction: Apply moisturizing lotion as directed Topical Primary Dressing KerraCel Ag Gelling Fiber Dressing, 2x2 in (silver alginate) Discharge Instruction: Apply silver alginate to wound bed as instructed Secondary Dressing Woven Gauze Sponge, Non-Sterile 4x4 in Secured With Compression Wrap FourPress (4 layer compression wrap) Discharge Instruction: Use first layer of unna boot to upper portion of lower leg. Apply four layer compression just above toes and just below the knee. Compression Stockings Add-Ons Electronic  Signature(s) Signed: 08/21/2020 4:19:56 PM By: Deon Pilling Entered By: Deon Pilling on 08/21/2020 07:54:32 -------------------------------------------------------------------------------- Vitals Details Patient Name: Date of Service: [place] 08/21/2020 7:30 A M Medical Record Number: [id number] Patient Account Number: [id number] Date of Birth/Sex: Treating RN: December 15, 1940 (79 y.o. [name], Lauren Primary Care Dayle Mcnerney: [name]., [name] Other Clinician: Referring Saharah Sherrow: Treating Charnese Federici/Extender: [name], [name] in Treatment: 45 Vital Signs Time Taken: 07:45 Temperature (F): 97.8 Height (in): 71 Pulse (bpm): 94 Weight (lbs): 198 Respiratory Rate (breaths/min): 20 Body Mass Index (BMI): 27.6 Blood Pressure (mmHg): 124/76 Reference Range: 80 - 120 mg / dl Electronic Signature(s) Signed: 08/27/2020 2:09:35 PM By: Rhae Hammock RN Entered By: Rhae Hammock on 08/21/2020 07:56:58

## 2020-09-04 ENCOUNTER — Encounter (HOSPITAL_BASED_OUTPATIENT_CLINIC_OR_DEPARTMENT_OTHER): Payer: Medicare Other | Attending: Internal Medicine | Admitting: Internal Medicine

## 2020-09-04 ENCOUNTER — Other Ambulatory Visit: Payer: Self-pay

## 2020-09-04 DIAGNOSIS — I132 Hypertensive heart and chronic kidney disease with heart failure and with stage 5 chronic kidney disease, or end stage renal disease: Secondary | ICD-10-CM | POA: Diagnosis not present

## 2020-09-04 DIAGNOSIS — I890 Lymphedema, not elsewhere classified: Secondary | ICD-10-CM | POA: Insufficient documentation

## 2020-09-04 DIAGNOSIS — L97822 Non-pressure chronic ulcer of other part of left lower leg with fat layer exposed: Secondary | ICD-10-CM | POA: Insufficient documentation

## 2020-09-04 DIAGNOSIS — E1122 Type 2 diabetes mellitus with diabetic chronic kidney disease: Secondary | ICD-10-CM | POA: Diagnosis not present

## 2020-09-04 DIAGNOSIS — X58XXXD Exposure to other specified factors, subsequent encounter: Secondary | ICD-10-CM | POA: Insufficient documentation

## 2020-09-04 DIAGNOSIS — E1151 Type 2 diabetes mellitus with diabetic peripheral angiopathy without gangrene: Secondary | ICD-10-CM | POA: Insufficient documentation

## 2020-09-04 DIAGNOSIS — L97812 Non-pressure chronic ulcer of other part of right lower leg with fat layer exposed: Secondary | ICD-10-CM | POA: Diagnosis not present

## 2020-09-04 DIAGNOSIS — E11622 Type 2 diabetes mellitus with other skin ulcer: Secondary | ICD-10-CM | POA: Insufficient documentation

## 2020-09-04 DIAGNOSIS — K921 Melena: Secondary | ICD-10-CM | POA: Insufficient documentation

## 2020-09-04 DIAGNOSIS — N186 End stage renal disease: Secondary | ICD-10-CM | POA: Insufficient documentation

## 2020-09-04 DIAGNOSIS — I429 Cardiomyopathy, unspecified: Secondary | ICD-10-CM | POA: Insufficient documentation

## 2020-09-04 DIAGNOSIS — I509 Heart failure, unspecified: Secondary | ICD-10-CM | POA: Insufficient documentation

## 2020-09-04 DIAGNOSIS — I87323 Chronic venous hypertension (idiopathic) with inflammation of bilateral lower extremity: Secondary | ICD-10-CM | POA: Diagnosis not present

## 2020-09-04 DIAGNOSIS — S90812D Abrasion, left foot, subsequent encounter: Secondary | ICD-10-CM | POA: Insufficient documentation

## 2020-09-04 NOTE — Progress Notes (Signed)
VERDUN, RACKLEY ([id number]) Visit Report for 09/04/2020 HPI Details Patient Name: Date of Service: [place] 09/04/2020 8:30 [place] Record Number: [id number] Patient Account Number: [id number] Date of Birth/Sex: Treating RN: [birth year] (80 y.o. [name], [name] Primary Care Provider: [name]., [name] Other Clinician: Referring Provider: Treating Provider/Extender: [name], [name] in Treatment: 70 History of Present Illness HPI Description: ADMISSION 10/11/2019 This is a 80 year old man with a history of a severe cardiomyopathy with an ejection fraction of about 20%, chronic renal failure stage III. He is listed as a type II diabetic in epic although the patient denies this. He also has a history of PVD. He states for the last month he has had wounds on his bilateral lower extremities that started off as blisters which denuded. He has areas on the left lateral calf and 2 on the right lateral. He has an area on the left first met head which he did not know was there he we identified this on intake. He has been using Silvadene cream provided by his primary care physician but he is complaining that this burns. 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 He was acutely ill at that time complicating GI bleed. ABIs in our clinic were 1.16 on the right and 1.13 on the left 2/25; the patient comes in with his areas on the left lateral and right lateral calf. There is also an area over the left first MTP bunion deformity. We have been using Sorbact. His edema control is fairly good 3/4; left lateral and  right lateral calf. Most of his wounds are in the same position tightly adherent nonviable debris. On the right we debrided the superior wound on the left both wounds. The area on his bunion over the left first MTP medially is I think just about closed. We have been using Sorbact without a lot of success changed to Iodoflex under compression 3/11; left lateral and right lateral calf perhaps minor improvement in the surface condition. We have been using Iodoflex. The area over the bunion of the left first MTP has closed over 3/18; left lateral and right lateral calf not much improvement. We have been using Iodoflex. Aggressive debridement last week. 3/25; left lateral and right lateral calf. We have been using Iodoflex. There is some improvement in the superior area on the right and 2 on the lateral left although there is still a lot of debris on the surface. The inferior area on the right is still a completely nonviable surface. 4/8; left lateral and right lateral calfs. Essentially mirror-image looking wounds 2 wounds on each side in close juxtaposition we have been using Iodoflex with some improvement in the very adherent fibrinous debris but not a lot. The patient has arterial studies next Wednesday morning and venous studies next Thursday morning. I have been avoiding any further aggressive debridement until we see the arterial study results. His ABIs were fairly good in this clinic and is dorsalis pedis pulses are palpable but the wound beds are pale. 4/15; left lateral and right lateral calfs. Essentially mirror-image wounds with 2 wounds on each side in close juxtaposition  but with rims of separating normal tissue. We have been using Iodoflex. The base of the wounds has been cleaning up quite nicely ARTERIAL STUDIES were done showing the patient had an ABI on the right of 1.27 with triphasic waveforms and a TBI of 0.90 on the left triphasic waveforms with an ABI of 1.28 and a TBI of 0.87. No  evidence of arterial disease VENOUS REFLUX STUDIES; were done yesterday we do not have this report yet. 4/23; VENOUS REFLUX STUDIES did not show any significant reflux right lower extremity. No evidence of a DVT He did have significant reflux in the common . femoral vein on the left but nothing else was listed as significant. He did not have a DVT. We have been using Iodoflex under compression his wounds are making progress. 4/29; improvements in the wound surface continue. We have been using Iodoflex. He has a 20% out-of-pocket co-pay for Apligraf which is unfortunate. We changed him to Sorbact today 5/6; started on Sorbact last week. Better looking wound surface but not much change in dimensions. 01/24/20-Patient is back at 3 weeks, wound surfaces are about the same but very minimal slough on the right leg wounds, however there is blistering on both legs adjacent to the wounds, patient denies any other symptoms or new symptoms. His studies have been reviewed and do not indicate any significant arterial or venous disease. But he is attending once in 3 weeks with home health changing in between 6/3; patient has 4 wounds 2 on each side of his lateral lower legs. These wounds are somewhat improved. He apparently arrived in clinic last week with a large blister on the right lateral lower leg that had denuded into the wound. They changed to silver alginate. Still under compression. He has straight Medicare and unfortunately has an unlimited co-pay for advanced treatment options. 6/10; his original 4 wounds are all better especially on the left lateral lower leg. 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 6/24; blister on the right lateral lower leg has healed. There is no additional wounds. The traumatic left second toe is also closed. The 4 that he has are all better 7/1; all of the patient's wounds are smaller except for the proximal one on the left lateral calf. We are using silver collagen 7/22; patient has home health. Relatively refractory wounds on the lateral part of both mid calfs. Each of them 2 wounds. The areas on the left are doing much better in fact the distal 1 looks like it is on his way to closure. The right required debridement with adherent fibrinous debris under illumination. We have been using silver collagen. Previous arterial studies were within normal limits he also had venous reflux studies 04/03/2020 upon evaluation today patient appears to be doing excellent with regard to his lower extremity ulcers. He is going to require a little bit of debridement on the wounds on the right especially in order to clear away some biofilm and slough but this is minimal and overall he seems to be doing quite well. 8/19; the patient's wounds on the left lateral lower leg just above closed. However the 2  on the lateral part of the right have a nonviable necrotic surface. This is disappointing. There is also no change in dimensions. He has had arterial studies as well as venous reflux studies 8/26; very disappointing today. Even the more superficial areas on the left are about the same as last week. Still 2 punched-out areas completely unchanged on the right. We have been using silver collagen really making no progress. 9/2; changed to Iodoflex last week better looking wound surfaces especially on the right although they are deep and punched out. The areas on the left are more superficial open 1 of these is almost fully epithelialized although it has been this way for at least 2 weeks. He has a 20% co-pay [Medicare only] unaffordable for a skin  substitute. Had some thought about a snap VAC on the deep areas on the right leg we will try to put this through in the insurance 9/9; punched-out wounds on the bilateral lateral lower extremities. We have been operating as if the these were chronic venous wounds with secondary lymphedema. The areas on the left have been doing well in fact one of them is closed over. We have had no improvement in the areas on the right we have been using Iodoflex to help with surface debridement. The areas on the right are deeper wider. I do not see any evidence of infection. Home health is not been putting the wraps on high enough he has localized lymphedema right above the wounds. He has had arterial and venous studies already 9/16; punched-out mirror-image wounds on his bilateral lateral mid calfs. We have 1 closed on the left lateral the other smaller. For the first time today the areas on the right lateral actually looks some better. I did a biopsy of 1 of these last time although we still do not have this result. HOWEVER he arrives in clinic today complaining of chest pain overnight which felt like a brick on his chest. He also had 2 black watery bowel movements. He does not have nausea or vomiting. He is not describing abdominal pain. He has no prior history of diarrhea heartburn etc. 9/30; sent this patient to the ER on 9/16. 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 He is on twice daily PPIs Protonix. His admission hemoglobin was exceptionally low at 5. 8.. Discharged at 8.1. 4 unit transfusion. 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 His diuretic dose was actually decreased because of elevated creatinine currently at torsemide 20 mg not felt to be a Coumadin candidate. Patient states he feels well. The areas on his left leg are healed. The areas on the right look better. These have been very refractory  wounds. He does not have stockings we will make arrangements for this today 10/7; the areas on the left leg are healed we will transition him into his 20/30 stocking today. Continued improvement in the areas on the right lateral leg 10/21; the areas on the left leg remain healed at this 2-week follow-up however there is a history that he developed some drainage from one of the wound sites with home health therefore going ahead and wrapping him. He was supposed to be on 20/30 stockings although the history here is vague and I am not sure that this represents a stocking failure or not. We have been wrapping his right leg with the one remaining wound is the distal wound. We  have been using silver collagen 10/28 the patient only has 1 wound remaining on the right lower calf. This still has 3 mm of depth which is unchanged but the surface area is smaller. The superior wound on the right lateral is closed. The 2 wounds on the left remain closed and he is using his compression stocking 11/4 1 remaining wound on the right lower calf. Under illumination not a viable surface we have been using silver collagen we had good effect on the other areas but this 1 appears to be stalled. There is no open area on the left leg he is using his own stocking. 11/11; 1 remaining wound on the right lower calf. Surface of this is a lot better than last week but still requiring debridement I am using Iodoflex but looking forward to changing the primary dressing to perhaps endoform 11/18; 1 remaining wound on the right lower leg, venous insufficiency, vigorous debridement last week/Iodoflex. Depth of the wound is therefore larger but the wound looks clean. Still very gritty material at the surface requiring debridement 12/2; the remaining wound on his right lower leg, venous insufficiency. I changed him to endoform 2 weeks ago. We appear to be making nice progress. Wound is measuring smaller 12/16; he comes in today with one of  his two wounds on the left opened infected second area on the left may be threatened as well as the healed one on the right. He has edema in both legs which I think is pitting. He says his weights are stable he has unknown fairly severe cardiomyopathy. His cardiologist is Dr. Brigitte Pulse it does not sound like he is really been carefully followed 12/23; the areas that I was concerned about on the left look a lot better we put some silver alginate and put him back in compression. It may be the compression that actually does the trick here. The remaining open area we have been using Iodoflex and again this looks a lot better. At our suggestion he went to see his primary who did not adjust his diuretic he is still apparently taking 20 mg of a loop diuretic and "sometimes" 40 mg is basically what the patient stated 1/6; the area on the left is closed once again. We will transition him into his 20/30 stocking from elastic therapy both areas on the right are now open which is a deterioration from last time at which time the only 1 was open but they are very superficial. We have been using Hydrofera Blue under compression Electronic Signature(s) Signed: 09/04/2020 5:18:36 PM By: Linton Ham MD Entered By: Linton Ham on 09/04/2020 09:37:22 -------------------------------------------------------------------------------- Physical Exam Details Patient Name: Date of Service: [place] 09/04/2020 8:30 [place] Record Number: [id number] Patient Account Number: [id number] Date of Birth/Sex: Treating RN: [birth year] (80 y.o. [name], [name] Primary Care Provider: [name]., [name] Other Clinician: Referring Provider: Treating Provider/Extender: [name], [name] in Treatment: 69 Constitutional Sitting or standing Blood Pressure is within target range for patient.. Pulse regular and within target range for patient.Marland Kitchen Respirations regular, non-labored and within target  range.. Temperature is normal and within the target range for the patient.Marland Kitchen Appears in no distress. Respiratory work of breathing is normal. Bilateral breath sounds are clear and equal in all lobes with no wheezes, rales or rhonchi.. Cardiovascular JVP is elevated 2 out of 6 pansystolic murmur at the lower left sternal border question TR. Gastrointestinal (GI) Abdomen is very  distended but nontender umbilical hernia. Notes Wound exam; right lateral lower leg wound both are open but very superficial. On the left side both are closed and well epithelialized. Electronic Signature(s) Signed: 09/04/2020 5:18:36 PM By: Linton Ham MD Entered By: Linton Ham on 09/04/2020 09:38:25 -------------------------------------------------------------------------------- Physician Orders Details Patient Name: Date of Service: [place] 09/04/2020 8:30 [place] Number: [id number] Patient Account Number: [id number] Date of Birth/Sex: Treating RN: 01/02/1941 (80 y.o. 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 Bathing/ Shower/ Hygiene May shower with protection but do not get wound dressing(s) wet. May shower and wash wound with soap and water. - with dressing changes only. Edema Control -  Lymphedema / SCD / Other Elevate legs to the level of the heart or above for 30 minutes daily and/or when sitting, a frequency of: - 3-4 times a day throughout the day. Avoid standing for long periods of time. Exercise regularly Moisturize legs daily. - lotion left leg every night before bed. Compression stocking or Garment 20-30 mm/Hg pressure to: - patient to apply compression stocking to left leg daily. Apply in the morning and remove at night. Home Health New wound care orders this week; continue Home Health for wound care. May utilize formulary equivalent dressing for wound treatment orders unless otherwise specified. - [facility] home [facility]. Wound Treatment Wound #11 - Lower Leg Wound Laterality: Right, Lateral, Proximal Cleanser: Soap and Water (Home Health) 2 x Per Week/30 Days Discharge Instructions: May shower and wash wound with dial antibacterial soap and water prior to dressing change. Peri-Wound Care: Sween Lotion (Moisturizing lotion) (Home Health) 2 x Per Week/30 Days Discharge Instructions: Apply moisturizing lotion as directed Prim Dressing: Hydrofera Blue Classic Foam, 2x2 in (Home Health) 2 x Per Week/30 Days ary Discharge Instructions: Moisten with saline prior to applying to wound bed Secondary Dressing: Woven Gauze Sponge, Non-Sterile 4x4 in (Home Health) 2 x Per Week/30 Days Discharge Instructions: Apply over primary dressing as directed. Compression Wrap: FourPress (4 layer compression wrap) (Home Health) 2 x Per Week/30 Days Discharge Instructions: Apply four layer compression as directed. Wound #4 - Lower Leg Wound Laterality: Right, Lateral, Distal Cleanser: Soap and Water (Home Health) 2 x Per Week/30 Days Discharge Instructions: May shower and wash wound with dial antibacterial soap and water prior to dressing change. Peri-Wound Care: Sween Lotion (Moisturizing lotion) (Home Health) 2 x Per Week/30 Days Discharge Instructions: Apply moisturizing lotion as  directed Prim Dressing: Hydrofera Blue Classic Foam, 2x2 in (Home Health) 2 x Per Week/30 Days ary Discharge Instructions: Moisten with saline prior to applying to wound bed Secondary Dressing: Woven Gauze Sponge, Non-Sterile 4x4 in (Home Health) 2 x Per Week/30 Days Discharge Instructions: Apply over primary dressing as directed. Compression Wrap: FourPress (4 layer compression wrap) (Home Health) 2 x Per Week/30 Days Discharge Instructions: Apply four layer compression as directed. Electronic Signature(s) Signed: 09/04/2020 5:18:36 PM By: Linton Ham MD Signed: 09/04/2020  6:28:52 PM By: Deon Pilling Entered By: Deon Pilling on 09/04/2020 08:44:40 -------------------------------------------------------------------------------- Problem List Details Patient Name: Date of Service: [place] 09/04/2020 8:30 [place] Record Number: [id number] Patient Account Number: [id number] Date of Birth/Sex: Treating RN: 04-Jul-1941 (80 y.o. 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/06/2020 No Yes L97.812 Non-pressure chronic ulcer of other part of right lower leg with fat layer 01/03/2020 No Yes exposed S90.812D Abrasion, left foot, subsequent encounter 02/07/2020 No Yes I42.9 Cardiomyopathy, unspecified 05/15/2020 No Yes K92.1 Melena 05/15/2020 No Yes Inactive Problems ICD-10 Code Description Active Date Inactive Date L97.521 Non-pressure chronic ulcer of other part of left foot limited to breakdown of  skin 10/11/2019 10/11/2019 Resolved Problems ICD-10 Code Description Active Date Resolved Date 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 10/11/2019 10/11/2019 Electronic Signature(s) Signed: 09/04/2020 5:18:36 PM By: Linton Ham MD Entered By: Linton Ham on 09/04/2020 09:36:13 -------------------------------------------------------------------------------- Progress Note Details Patient Name: Date of Service: [place] 09/04/2020 8:30 [place] Record Number: [id number] Patient Account Number: [id number] Date of Birth/Sex: Treating RN: September 17, 1940 (80 y.o. [name], [name] Primary Care Provider: [name]., [name] Other Clinician: Referring Provider: Treating Provider/Extender: [name], [name] in Treatment: 59 Subjective History of Present Illness (HPI) ADMISSION 10/11/2019 This is a 80 year old man with a history of a severe cardiomyopathy with an ejection fraction of about 20%, chronic renal failure stage III. He is listed as a type II diabetic in epic although the patient denies this. He also has a history of PVD. He states for the last month he has had wounds on his bilateral lower extremities that started off as blisters which denuded. He has areas on the left lateral calf and 2 on the right lateral. He has an area on the left first met head which he did not know was there he we identified this on intake. He has been using Silvadene cream provided by his primary care physician but he is complaining that this burns. 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  He was acutely ill at that time complicating GI bleed. ABIs in our clinic were 1.16 on the right and 1.13 on the left 2/25; the patient comes in with his areas on the left lateral and right lateral calf. There is also an area over the left first MTP bunion deformity. We have been using Sorbact. His edema control is fairly good 3/4; left lateral and right lateral calf. Most of his wounds are in the same position tightly adherent nonviable debris. On the right we debrided the superior wound on the left both wounds. The area on his bunion over the left first MTP medially is I think just about closed. We  have been using Sorbact without a lot of success changed to Iodoflex under compression 3/11; left lateral and right lateral calf perhaps minor improvement in the surface condition. We have been using Iodoflex. The area over the bunion of the left first MTP has closed over 3/18; left lateral and right lateral calf not much improvement. We have been using Iodoflex. Aggressive debridement last week. 3/25; left lateral and right lateral calf. We have been using Iodoflex. There is some improvement in the superior area on the right and 2 on the lateral left although there is still a lot of debris on the surface. The inferior area on the right is still a completely nonviable surface. 4/8; left lateral and right lateral calfs. Essentially mirror-image looking wounds 2 wounds on each side in close juxtaposition we have been using Iodoflex with some improvement in the very adherent fibrinous debris but not a lot. The patient has arterial studies next Wednesday morning and venous studies next Thursday morning. I have been avoiding any further aggressive debridement until we see the arterial study results. His ABIs were fairly good in this clinic and is dorsalis pedis pulses are palpable but the wound beds are pale. 4/15; left lateral and right lateral calfs. Essentially mirror-image wounds with 2 wounds on each  side in close juxtaposition but with rims of separating normal tissue. We have been using Iodoflex. The base of the wounds has been cleaning up quite nicely ARTERIAL STUDIES were done showing the patient had an ABI on the right of 1.27 with triphasic waveforms and a TBI of 0.90 on the left triphasic waveforms with an ABI of 1.28 and a TBI of 0.87. No evidence of arterial disease VENOUS REFLUX STUDIES; were done yesterday we do not have this report yet. 4/23; VENOUS REFLUX STUDIES did not show any significant reflux right lower extremity. No evidence of a DVT He did have significant reflux in the common . femoral vein on the left but nothing else was listed as significant. He did not have a DVT. We have been using Iodoflex under compression his wounds are making progress. 4/29; improvements in the wound surface continue. We have been using Iodoflex. He has a 20% out-of-pocket co-pay for Apligraf which is unfortunate. We changed him to Sorbact today 5/6; started on Sorbact last week. Better looking wound surface but not much change in dimensions. 01/24/20-Patient is back at 3 weeks, wound surfaces are about the same but very minimal slough on the right leg wounds, however there is blistering on both legs adjacent to the wounds, patient denies any other symptoms or new symptoms. His studies have been reviewed and do not indicate any significant arterial or venous disease. But he is attending once in 3 weeks with home health changing in between 6/3; patient has 4 wounds 2 on each side of his lateral lower legs. These wounds are somewhat improved. He apparently arrived in clinic last week with a large blister on the right lateral lower leg that had denuded into the wound. They changed to silver alginate. Still under compression. He has straight Medicare and unfortunately has an unlimited co-pay for advanced treatment options. 6/10; his original 4 wounds are all better especially on the left lateral  lower leg. Areas on the right medial have a better looking surface were using silver collagen ooThe blister on the right lateral lower leg from last week has an open area however this looks fairly healthy were using silver alginate on this ooHe comes in today having "stubbed"  his left second toe he has an abrasion at the base of the nailbed I think he probably caught the nail which is mycotic. 6/24; blister on the right lateral lower leg has healed. There is no additional wounds. The traumatic left second toe is also closed. The 4 that he has are all better 7/1; all of the patient's wounds are smaller except for the proximal one on the left lateral calf. We are using silver collagen 7/22; patient has home health. Relatively refractory wounds on the lateral part of both mid calfs. Each of them 2 wounds. The areas on the left are doing much better in fact the distal 1 looks like it is on his way to closure. The right required debridement with adherent fibrinous debris under illumination. We have been using silver collagen. Previous arterial studies were within normal limits he also had venous reflux studies 04/03/2020 upon evaluation today patient appears to be doing excellent with regard to his lower extremity ulcers. He is going to require a little bit of debridement on the wounds on the right especially in order to clear away some biofilm and slough but this is minimal and overall he seems to be doing quite well. 8/19; the patient's wounds on the left lateral lower leg just above closed. However the 2 on the lateral part of the right have a nonviable necrotic surface. This is disappointing. There is also no change in dimensions. He has had arterial studies as well as venous reflux studies 8/26; very disappointing today. Even the more superficial areas on the left are about the same as last week. Still 2 punched-out areas completely unchanged on the right. We have been using silver collagen really  making no progress. 9/2; changed to Iodoflex last week better looking wound surfaces especially on the right although they are deep and punched out. The areas on the left are more superficial open 1 of these is almost fully epithelialized although it has been this way for at least 2 weeks. He has a 20% co-pay [Medicare only] unaffordable for a skin substitute. Had some thought about a snap VAC on the deep areas on the right leg we will try to put this through in the insurance 9/9; punched-out wounds on the bilateral lateral lower extremities. We have been operating as if the these were chronic venous wounds with secondary lymphedema. The areas on the left have been doing well in fact one of them is closed over. We have had no improvement in the areas on the right we have been using Iodoflex to help with surface debridement. The areas on the right are deeper wider. I do not see any evidence of infection. Home health is not been putting the wraps on high enough he has localized lymphedema right above the wounds. He has had arterial and venous studies already 9/16; punched-out mirror-image wounds on his bilateral lateral mid calfs. We have 1 closed on the left lateral the other smaller. For the first time today the areas on the right lateral actually looks some better. I did a biopsy of 1 of these last time although we still do not have this result. HOWEVER he arrives in clinic today complaining of chest pain overnight which felt like a brick on his chest. He also had 2 black watery bowel movements. He does not have nausea or vomiting. He is not describing abdominal pain. He has no prior history of diarrhea heartburn etc. 9/30; sent this patient to the ER on 9/16. 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 He is on twice daily PPIs Protonix. His admission hemoglobin was exceptionally low at 5. 8.. Discharged at 8.1. 4 unit transfusion. 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 His diuretic dose was actually decreased because of elevated creatinine currently at torsemide 20 mg not felt to be a Coumadin candidate. Patient states he feels well. The areas on his left leg are healed. The areas on the right look better. These have been very refractory wounds. He does not have stockings we will make arrangements for this today 10/7; the areas on the left leg are healed we will transition him into his 20/30 stocking today. Continued improvement in the areas on the right lateral leg 10/21; the areas on the left leg remain healed at this 2-week follow-up however there is a history that he developed some drainage from one of the wound sites with home health therefore going ahead and wrapping him. He was supposed to be on 20/30 stockings although the history here is vague and I am not sure that this represents a stocking failure or not. We have been wrapping his right leg with the one remaining wound is the distal wound. We have been using silver collagen 10/28 the patient only has 1 wound remaining on the right lower calf. This still has 3 mm of depth which is unchanged but the surface area is smaller. The superior wound on the right lateral is closed. The 2 wounds on the left remain closed and he is using his compression stocking 11/4 1 remaining wound on the right lower calf. Under illumination not a viable surface we have been using silver collagen we had good effect on the other areas but this 1 appears to be stalled. There is no open area on the left leg he is using his own stocking. 11/11; 1 remaining wound on the right lower calf. Surface of this is a lot better than last week but still requiring debridement I am using Iodoflex but looking forward to changing the primary dressing to perhaps endoform 11/18; 1 remaining wound on the right lower leg, venous insufficiency, vigorous debridement last  week/Iodoflex. Depth of the wound is therefore larger but the wound looks clean. Still very gritty material at the surface requiring debridement 12/2; the remaining wound on his right lower leg, venous insufficiency. I changed him to endoform 2 weeks ago. We appear to be making nice progress. Wound is measuring smaller 12/16; he comes in today with one of his two wounds on the left opened infected second area on the left may be threatened as well as the healed one on the right. He has edema in both legs which I think is pitting. He says his weights are stable he has unknown fairly severe cardiomyopathy. His cardiologist is Dr. Brigitte Pulse it does not sound like he is really been carefully followed 12/23; the areas that I was concerned about on the left look a lot better we put some silver alginate and put him back in compression. It may be the compression that actually does the trick here. The remaining open area we have been using Iodoflex and again this looks a lot better. At our suggestion he went to see his primary who did not adjust his diuretic he is still apparently taking 20 mg of a loop diuretic and "sometimes" 40 mg is basically what the patient stated 1/6; the  area on the left is closed once again. We will transition him into his 20/30 stocking from elastic therapy both areas on the right are now open which is a deterioration from last time at which time the only 1 was open but they are very superficial. We have been using Hydrofera Blue under compression Objective Constitutional Sitting or standing Blood Pressure is within target range for patient.. Pulse regular and within target range for patient.Marland Kitchen Respirations regular, non-labored and within target range.. Temperature is normal and within the target range for the patient.Marland Kitchen Appears in no distress. Vitals Time Taken: 8:02 AM, Height: 71 in, Weight: 198 lbs, BMI: 27.6, Temperature: 98.1 F, Pulse: 106 bpm, Respiratory Rate: 20 breaths/min,  Blood Pressure: 128/81 mmHg. Respiratory work of breathing is normal. Bilateral breath sounds are clear and equal in all lobes with no wheezes, rales or rhonchi.. Cardiovascular JVP is elevated 2 out of 6 pansystolic murmur at the lower left sternal border question TR. Gastrointestinal (GI) Abdomen is very distended but nontender umbilical hernia. General Notes: Wound exam; right lateral lower leg wound both are open but very superficial. On the left side both are closed and well epithelialized. Integumentary (Hair, Skin) Wound #10 status is Healed - Epithelialized. Original cause of wound was Gradually Appeared. The wound is located on the Left,Lateral Lower Leg. The wound measures 0cm length x 0cm width x 0cm depth; 0cm^2 area and 0cm^3 volume. There is no tunneling or undermining noted. There is a none present amount of drainage noted. The wound margin is flat and intact. There is no granulation within the wound bed. There is no necrotic tissue within the wound bed. Wound #11 status is Open. Original cause of wound was Gradually Appeared. The wound is located on the Right,Proximal,Lateral Lower Leg. The wound measures 1cm length x 0.6cm width x 0.1cm depth; 0.471cm^2 area and 0.047cm^3 volume. There is Fat Layer (Subcutaneous Tissue) exposed. There is no tunneling or undermining noted. There is a small amount of serosanguineous drainage noted. The wound margin is flat and intact. There is large (67-100%) red granulation within the wound bed. There is no necrotic tissue within the wound bed. Wound #4 status is Open. Original cause of wound was Blister. The wound is located on the Right,Distal,Lateral Lower Leg. The wound measures 1.3cm length x 0.6cm width x 0.1cm depth; 0.613cm^2 area and 0.061cm^3 volume. There is Fat Layer (Subcutaneous Tissue) exposed. There is no tunneling or undermining noted. There is a small amount of serosanguineous drainage noted. The wound margin is flat and intact.  There is large (67-100%) red granulation within the wound bed. There is no necrotic tissue within the wound bed. Wound #9 status is Healed - Epithelialized. Original cause of wound was Gradually Appeared. The wound is located on the Left,Anterior Lower Leg. The wound measures 0cm length x 0cm width x 0cm depth; 0cm^2 area and 0cm^3 volume. There is no tunneling or undermining noted. There is a none present amount of drainage noted. The wound margin is distinct with the outline attached to the wound base. There is no granulation within the wound bed. There is no necrotic tissue within the wound bed. Assessment Active Problems ICD-10 Chronic venous hypertension (idiopathic) with inflammation of bilateral lower extremity Lymphedema, not elsewhere classified Non-pressure chronic ulcer of other part of left lower leg with fat layer exposed Non-pressure chronic ulcer of other part of right lower leg with fat layer exposed Abrasion, left foot, subsequent encounter Cardiomyopathy, unspecified [facility] Procedures Wound #11 Pre-procedure diagnosis of  Wound #11 is a Venous Leg Ulcer located on the Right,Proximal,Lateral Lower Leg . There was a Four Layer Compression Therapy Procedure by Baruch Gouty, RN. Post procedure Diagnosis Wound #11: Same as Pre-Procedure Wound #4 Pre-procedure diagnosis of Wound #4 is a Venous Leg Ulcer located on the Right,Distal,Lateral Lower Leg . There was a Four Layer Compression Therapy Procedure by Baruch Gouty, RN. Post procedure Diagnosis Wound #4: Same as Pre-Procedure Plan Follow-up Appointments: Return Appointment in 2 weeks. Bathing/ Shower/ Hygiene: May shower with protection but do not get wound dressing(s) wet. May shower and wash wound with soap and water. - with dressing changes only. Edema Control - Lymphedema / SCD / Other: Elevate legs to the level of the heart or above for 30 minutes daily and/or when sitting, a frequency of: - 3-4 times a day  throughout the day. Avoid standing for long periods of time. Exercise regularly Moisturize legs daily. - lotion left leg every night before bed. Compression stocking or Garment 20-30 mm/Hg pressure to: - patient to apply compression stocking to left leg daily. Apply in the morning and remove at night. Home Health: New wound care orders this week; continue Home Health for wound care. May utilize formulary equivalent dressing for wound treatment orders unless otherwise specified. - [facility] home [facility]. WOUND #11: - Lower Leg Wound Laterality: Right, Lateral, Proximal Cleanser: Soap and Water (Home Health) 2 x Per Week/30 Days Discharge Instructions: May shower and wash wound with dial antibacterial soap and water prior to dressing change. Peri-Wound Care: Sween Lotion (Moisturizing lotion) (Home Health) 2 x Per Week/30 Days Discharge Instructions: Apply moisturizing lotion as directed Prim Dressing: Hydrofera Blue Classic Foam, 2x2 in (Home Health) 2 x Per Week/30 Days ary Discharge Instructions: Moisten with saline prior to applying to wound bed Secondary Dressing: Woven Gauze Sponge, Non-Sterile 4x4 in (Home Health) 2 x Per Week/30 Days Discharge Instructions: Apply over primary dressing as directed. Com pression Wrap: FourPress (4 layer compression wrap) (Home Health) 2 x Per Week/30 Days Discharge Instructions: Apply four layer compression as directed. WOUND #4: - Lower Leg Wound Laterality: Right, Lateral, Distal Cleanser: Soap and Water (Home Health) 2 x Per Week/30 Days Discharge Instructions: May shower and wash wound with dial antibacterial soap and water prior to dressing change. Peri-Wound Care: Sween Lotion (Moisturizing lotion) (Home Health) 2 x Per Week/30 Days Discharge Instructions: Apply moisturizing lotion as directed Prim Dressing: Hydrofera Blue Classic Foam, 2x2 in (Home Health) 2 x Per Week/30 Days ary Discharge Instructions: Moisten with saline prior to applying to  wound bed Secondary Dressing: Woven Gauze Sponge, Non-Sterile 4x4 in (Home Health) 2 x Per Week/30 Days Discharge Instructions: Apply over primary dressing as directed. Com pression Wrap: FourPress (4 layer compression wrap) (Home Health) 2 x Per Week/30 Days Discharge Instructions: Apply four layer compression as directed. 1. The areas on the left leg are closed and were putting him in a 20/30 below-knee stocking. I am not completely certain that this will maintain integrity here. 2. On the right both areas are open this is a deterioration from last time at which time only 1 was open but the wound surfaces here are superficial. 3. I am not certain about his fluid volume status. His jugular venous pressure is elevated may have tricuspid regurgitation his abdomen is distended he is on 40 mg of Lasix a day nevertheless I do not think this is playing a big role in these wounds however will check his left leg next time Electronic Signature(s)  Signed: 09/04/2020 5:18:36 PM By: Linton Ham MD Entered By: Linton Ham on 09/04/2020 09:40:13 -------------------------------------------------------------------------------- SuperBill Details Patient Name: Date of Service: [place], [place] 09/04/2020 Medical Record Number: [id number] Patient Account Number: [id number] Date of Birth/Sex: Treating RN: September 16, 1940 (80 y.o. [name], [name] Primary Care Provider: [name]., [name] Other Clinician: Referring Provider: Treating Provider/Extender: [name], [name] in Treatment: 47 Diagnosis Coding ICD-10 Codes Code Description [phone] Chronic venous hypertension (idiopathic) with inflammation of bilateral lower extremity I89.0 Lymphedema, not elsewhere classified L97.822 Non-pressure chronic ulcer of other part of left lower leg with fat layer exposed L97.812 Non-pressure chronic ulcer of other part of right lower leg with fat layer exposed S90.812D Abrasion, left foot,  subsequent encounter I42.9 Cardiomyopathy, unspecified K92.1 [facility] Facility Procedures The patient participates with Medicare or their insurance follows the Medicare Facility Guidelines: CPT4 Code Description Modifier Quantity [id number] (Facility Use Only) [phone] - APPLY [place] RT LEG 1 Physician Procedures : CPT4 Code Description Modifier [id number] - WC PHYS LEVEL 3 - EST PT ICD-10 Diagnosis Description L97.822 Non-pressure chronic ulcer of other part of left lower leg with fat layer exposed L97.812 Non-pressure chronic ulcer of other part of right  lower leg with fat layer exposed I87.323 Chronic venous hypertension (idiopathic) with inflammation of bilateral lower extremity I89.0 Lymphedema, not elsewhere classified Quantity: 1 Electronic Signature(s) Signed: 09/04/2020 5:18:36 PM By: Linton Ham MD Signed: 09/04/2020 5:18:36 PM By: Linton Ham MD Entered By: Linton Ham on 09/04/2020 09:40:36

## 2020-09-08 NOTE — Progress Notes (Signed)
[name], [name] ([id number]) Visit Report for 09/04/2020 Arrival Information Details Patient Name: Date of Service: [place] 09/04/2020 8:30 [place] Record Number: [id number] Patient Account Number: [id number] Date of Birth/Sex: Treating RN: 08-25-1941 (80 y.o. [name] Primary Care [name]: [name]., [name] Other Clinician: Referring [name]: Treating [name]/Extender: Troy Leitz., [name] in Treatment: 84 Visit Information History Since Last Visit Added or deleted any medications: No Patient Arrived: Cane Any new allergies or adverse reactions: No Arrival Time: 07:58 Had a fall or experienced change in No Accompanied By: self activities of daily living that may affect Transfer Assistance: None risk of falls: Patient Identification Verified: Yes Signs or symptoms of abuse/neglect since last visito No Secondary Verification Process Completed: Yes Hospitalized since last visit: No Patient Requires Transmission-Based Precautions: No Implantable device outside of the clinic excluding No Patient Has Alerts: Yes cellular tissue based products placed in the center Patient Alerts: Patient on Blood Thinner since last visit: Has Dressing in Place as Prescribed: Yes Pain Present Now: No Electronic Signature(s) Signed: 09/05/2020 11:05:27 AM By: [name] Entered By: [name] on 09/04/2020 08:02:39 -------------------------------------------------------------------------------- Compression Therapy Details Patient Name: Date of Service: [place] 09/04/2020 8:30 [place] Record Number: [id number] Patient Account Number: [id number] Date of Birth/Sex: Treating RN: [birth year] (80 y.o. [name] Primary Care Altair Stanko: [name]., [name] Other Clinician: Referring [name]: Treating [name]/Extender: Troy Leitz., [name] in Treatment: 47 Compression Therapy Performed for Wound  Assessment: Wound #11 Right,Proximal,Lateral Lower Leg Performed By: Clinician Troy Gouty, RN Compression Type: Four Layer Post Procedure Diagnosis Same as Pre-procedure Electronic Signature(s) Signed: 09/04/2020 6:28:52 PM By: [name] Entered By: [name] on 09/04/2020 08:42:11 -------------------------------------------------------------------------------- Compression Therapy Details Patient Name: Date of Service: [name] 09/04/2020 8:30 [place] Record Number: [id number] Patient Account Number: [id number] Date of Birth/Sex: Treating RN: Sep 17, 1940 (80 y.o. [name] Primary Care Abayomi Pattison: [name]., [name] Other Clinician: Referring [name]: Treating [name]/Extender: Troy Leitz., [name] in Treatment: 47 Compression Therapy Performed for Wound Assessment: Wound #4 Right,Distal,Lateral Lower Leg Performed By: Clinician Troy Gouty, RN Compression Type: Four Layer Post Procedure Diagnosis Same as Pre-procedure Electronic Signature(s) Signed: 09/04/2020 6:28:52 PM By: [name] Entered By: [name] on 09/04/2020 08:42:11 -------------------------------------------------------------------------------- Encounter Discharge Information Details Patient Name: Date of Service: [place] 09/04/2020 8:30 [place] Record Number: [id number] Patient Account Number: [id number] Date of Birth/Sex: Treating RN: [birth year] (80 y.o. [name] Primary Care [name]: [name]., [name] Other Clinician: Referring [name]: Treating [name]/Extender: Troy Leitz., [name] in Treatment: 81 Encounter Discharge Information Items Discharge Condition: Stable Ambulatory Status: Cane Discharge Destination: Home Transportation: Private Auto Accompanied By: alone Schedule Follow-up Appointment: Yes Clinical Summary of Care: Patient Declined Electronic Signature(s) Signed:  09/08/2020 5:04:19 PM By: Troy Hurst RN, BSN Entered By: [name] on 09/04/2020 09:02:06 -------------------------------------------------------------------------------- Lower Extremity Assessment Details Patient Name: Date of Service: [place] 09/04/2020 8:30 [place] Record Number: [id number] Patient Account Number: [id number] Date of Birth/Sex: Treating RN: [birth year] (80 y.o. [name] Primary Care [name]: [name]., [name] Other Clinician: Referring [name]: Treating [name]/Extender: Troy Leitz., [name] in Treatment: 47 Edema Assessment Assessed: [Left: No] [Right: No] Edema: [Left:  Yes] [Right: Yes] Calf Left: Right: Point of Measurement: 53 cm From Medial Instep 40 cm 37.3 cm Ankle Left: Right: Point of Measurement: 14 cm From Medial Instep 22.8 cm 24.2 cm Vascular Assessment Pulses: Dorsalis Pedis Palpable: [Left:Yes] [Right:Yes] Electronic Signature(s) Signed: 09/04/2020 6:02:34 PM By: Troy Gouty RN, BSN Entered By: [name] on 09/04/2020 08:30:54 -------------------------------------------------------------------------------- Multi Wound Chart Details Patient Name: Date of Service: [place] 09/04/2020 8:30 [place] Record Number: [id number] Patient Account Number: [id number] Date of Birth/Sex: Treating RN: 20-Jul-1941 (80 y.o. [name], [name] Primary Care [name]: [name]., [name] Other Clinician: Referring [name]: Treating [name]/Extender: Troy Leitz., [name] in Treatment: 55 Vital Signs Height(in): 71 Pulse(bpm): 106 Weight(lbs): 198 Blood Pressure(mmHg): 128/81 Body Mass Index(BMI): 28 Temperature(F): 98.1 Respiratory Rate(breaths/min): 20 Photos: [10:No Photos Left, Lateral Lower Leg] [11:No Photos Right, Proximal, Lateral Lower Leg] [4:No Photos Right, Distal, Lateral Lower Leg] Wound Location: [10:Gradually Appeared]  [11:Gradually Appeared] [4:Blister] Wounding Event: [10:Venous Leg Ulcer] [11:Venous Leg Ulcer] [4:Venous Leg Ulcer] Primary Etiology: [10:Anemia, Congestive Heart Failure,] [11:Anemia, Congestive Heart Failure,] [4:Anemia, Congestive Heart Failure,] Comorbid History: [10:Hypertension, Peripheral Venous Disease, End Stage Renal Disease 08/06/2020] [11:Hypertension, Peripheral Venous Disease, End Stage Renal Disease Disease, End Stage Renal Disease 09/04/2020] [4:Hypertension, Peripheral Venous  09/11/2019] Date Acquired: [10:3] [11:0] [4:47] Weeks of Treatment: [10:Healed - Epithelialized] [11:Open] [4:Open] Wound Status: [10:0x0x0] [11:1x0.6x0.1] [4:1.3x0.6x0.1] Measurements L x W x D (cm) [10:0] [11:0.471] [4:0.613] A (cm) : rea [10:0] [11:0.047] [4:0.061] Volume (cm) : [10:100.00%] [11:0.00%] [4:93.40%] % Reduction in Area: [10:100.00%] [11:0.00%] [4:93.40%] % Reduction in Volume: [10:Full Thickness Without Exposed] [11:Full Thickness Without Exposed] [4:Full Thickness Without Exposed] Classification: [10:Support Structures None Present] [11:Support Structures Small] [4:Support Structures Small] Exudate Amount: [10:N/A] [11:Serosanguineous] [4:Serosanguineous] Exudate Type: [10:N/A] [11:red, brown] [4:red, brown] Exudate Color: [10:Flat and Intact] [11:Flat and Intact] [4:Flat and Intact] Wound Margin: [10:None Present (0%)] [11:Large (67-100%)] [4:Large (67-100%)] Granulation Amount: [10:N/A] [11:Red] [4:Red] Granulation Quality: [10:None Present (0%)] [11:None Present (0%)] [4:None Present (0%)] Necrotic Amount: [10:Fascia: No] [11:Fat Layer (Subcutaneous Tissue): Yes Fat Layer (Subcutaneous Tissue): Yes] Exposed Structures: [10:Fat Layer (Subcutaneous Tissue): No Tendon: No Muscle: No Joint: No Bone: No Large (67-100%)] [11:Fascia: No Tendon: No Muscle: No Joint: No Bone: No Small (1-33%)] [4:Fascia: No Tendon: No Muscle: No Joint: No Bone: No Small (1-33%)] Epithelialization: [10:N/A]  [11:Compression Therapy] [4:Compression Therapy] Wound Number: 9 N/A N/A Photos: No Photos N/A N/A Left, Anterior Lower Leg N/A N/A Wound Location: Gradually Appeared N/A N/A Wounding Event: Venous Leg Ulcer N/A N/A Primary Etiology: Anemia, Congestive Heart Failure, N/A N/A Comorbid History: Hypertension, Peripheral Venous Disease, End Stage Renal Disease 08/06/2020 N/A N/A Date Acquired: 3 N/A N/A Weeks of Treatment: Healed - Epithelialized N/A N/A Wound Status: 0x0x0 N/A N/A Measurements L x W x D (cm) 0 N/A N/A A (cm) : rea 0 N/A N/A Volume (cm) : 100.00% N/A N/A % Reduction in Area: 100.00% N/A N/A % Reduction in Volume: Full Thickness Without Exposed N/A N/A Classification: Support Structures None Present N/A N/A Exudate Amount: N/A N/A N/A Exudate Type: N/A N/A N/A Exudate Color: Distinct, outline attached N/A N/A Wound Margin: None Present (0%) N/A N/A Granulation Amount: N/A N/A N/A Granulation Quality: None Present (0%) N/A N/A Necrotic Amount: Fascia: No N/A N/A Exposed Structures: Fat Layer (Subcutaneous Tissue): No Tendon: No Muscle: No Joint: No Bone: No Large (67-100%) N/A N/A Epithelialization: N/A N/A N/A Procedures Performed: Treatment Notes Wound #11 (Lower Leg)  Wound Laterality: Right, Lateral, Proximal Cleanser Soap and Water Discharge Instruction: May shower and wash wound with dial antibacterial soap and water prior to dressing change. Peri-Wound Care Sween Lotion (Moisturizing lotion) Discharge Instruction: Apply moisturizing lotion as directed Topical Primary Dressing Hydrofera Blue Classic Foam, 2x2 in Discharge Instruction: Moisten with saline prior to applying to wound bed Secondary Dressing Woven Gauze Sponge, Non-Sterile 4x4 in Discharge Instruction: Apply over primary dressing as directed. Secured With Compression Wrap FourPress (4 layer compression wrap) Discharge Instruction: Apply four layer compression as  directed. Compression Stockings Add-Ons Wound #4 (Lower Leg) Wound Laterality: Right, Lateral, Distal Cleanser Soap and Water Discharge Instruction: May shower and wash wound with dial antibacterial soap and water prior to dressing change. Peri-Wound Care Sween Lotion (Moisturizing lotion) Discharge Instruction: Apply moisturizing lotion as directed Topical Primary Dressing Hydrofera Blue Classic Foam, 2x2 in Discharge Instruction: Moisten with saline prior to applying to wound bed Secondary Dressing Woven Gauze Sponge, Non-Sterile 4x4 in Discharge Instruction: Apply over primary dressing as directed. Secured With Compression Wrap FourPress (4 layer compression wrap) Discharge Instruction: Apply four layer compression as directed. Compression Stockings Add-Ons Electronic Signature(s) Signed: 09/04/2020 5:18:36 PM By: Linton Ham MD Signed: 09/04/2020 6:28:52 PM By: [name] Entered By: Linton Ham on 09/04/2020 09:36:22 -------------------------------------------------------------------------------- Multi-Disciplinary Care Plan Details Patient Name: Date of Service: [place] 09/04/2020 8:30 A M Medical Record Number: [id number] Patient Account Number: [id number] Date of Birth/Sex: Treating RN: 08/02/1941 (80 y.o. [name], [name] Primary Care Jazon Jipson: [name]., [name] Other Clinician: Referring Jari Carollo: Treating Pelham Hennick/Extender: Troy Leitz., [name] in Treatment: 25 Active Inactive Venous Leg Ulcer Nursing Diagnoses: Knowledge deficit related to disease process and management Potential for venous Insuffiency (use before diagnosis confirmed) Goals: Patient will maintain optimal edema control Date Initiated: 05/01/2020 Target Resolution Date: 10/31/2020 Goal Status: Active Interventions: Assess peripheral edema status every visit. Compression as ordered Provide education on venous insufficiency Notes: Electronic  Signature(s) Signed: 09/04/2020 6:28:52 PM By: [name] Entered By: [name] on 09/04/2020 08:06:24 -------------------------------------------------------------------------------- Pain Assessment Details Patient Name: Date of Service: [place] [name] 09/04/2020 8:30 [place] Record Number: [id number] Patient Account Number: [id number] Date of Birth/Sex: Treating RN: 31-Dec-1940 (80 y.o. [name], [name] Primary Care Adarian Bur: [name]., [name] Other Clinician: Referring [name]: Treating Saramarie Stinger/Extender: Troy Leitz., [name] in Treatment: 33 Active Problems Location of Pain Severity and Description of Pain Patient Has Paino No Site Locations Rate the pain. Current Pain Level: 0 Pain Management and Medication Current Pain Management: Electronic Signature(s) Signed: 09/04/2020 6:02:34 PM By: Troy Gouty RN, BSN Signed: 09/04/2020 6:28:52 PM By: [name] Entered By: [name] on 09/04/2020 08:27:50 -------------------------------------------------------------------------------- Patient/Caregiver Education Details Patient Name: Date of Service: [place] 1/6/2022andnbsp8:30 [place] Record Number: [id number] Patient Account Number: [id number] Date of Birth/Gender: Treating RN: [birth year] (80 y.o. [name] Primary Care Physician: [name]., [name] Other Clinician: Referring Physician: Treating Physician/Extender: Troy Leitz., [name] in Treatment: 24 Education Assessment Education Provided To: Patient Education Topics Provided Venous: Handouts: Managing Venous Disease and Related Ulcers Methods: Explain/Verbal Responses: Reinforcements needed Electronic Signature(s) Signed: 09/04/2020 6:28:52 PM By: [name] Entered By: [name] on 09/04/2020 08:36:16 -------------------------------------------------------------------------------- Wound Assessment  Details Patient Name: Date of Service: [name] 09/04/2020 8:30 [place] Record Number: [id number] Patient Account  Number: [id number] Date of Birth/Sex: Treating RN: 1940-11-25 (80 y.o. [name], [name] Primary Care Gerilynn Mccullars: [name]., [name] Other Clinician: Referring [name]: Treating Jaxsun Ciampi/Extender: Troy Leitz., [name] in Treatment: 47 Wound Status Wound Number: 10 Primary Venous Leg Ulcer Etiology: Wound Location: Left, Lateral Lower Leg Wound Healed - Epithelialized Wounding Event: Gradually Appeared Status: Date Acquired: 08/06/2020 Comorbid Anemia, Congestive Heart Failure, Hypertension, Peripheral Weeks Of Treatment: 3 History: Venous Disease, End Stage Renal Disease Clustered Wound: No Wound Measurements Length: (cm) Width: (cm) Depth: (cm) Area: (cm) Volume: (cm) 0 % Reduction in Area: 100% 0 % Reduction in Volume: 100% 0 Epithelialization: Large (67-100%) 0 Tunneling: No 0 Undermining: No Wound Description Classification: Full Thickness Without Exposed Support Structures Wound Margin: Flat and Intact Exudate Amount: None Present Foul Odor After Cleansing: No Slough/Fibrino No Wound Bed Granulation Amount: None Present (0%) Exposed Structure Necrotic Amount: None Present (0%) Fascia Exposed: No Fat Layer (Subcutaneous Tissue) Exposed: No Tendon Exposed: No Muscle Exposed: No Joint Exposed: No Bone Exposed: No Electronic Signature(s) Signed: 09/04/2020 6:02:34 PM By: Troy Gouty RN, BSN Signed: 09/04/2020 6:28:52 PM By: [name] Entered By: [name] on 09/04/2020 08:31:58 -------------------------------------------------------------------------------- Wound Assessment Details Patient Name: [place] of Service: [place] 09/04/2020 8:30 A M Medical Record Number: [id number] Patient Account Number: [id number] Date of Birth/Sex: Treating RN: 1941/05/22 (80 y.o. [name], [name] Primary  Care [name]: [name]., [name] Other Clinician: Referring [name]: Treating Gissele Narducci/Extender: Troy Leitz., [name] in Treatment: 47 Wound Status Wound Number: 11 Primary Venous Leg Ulcer Etiology: Wound Location: Right, Proximal, Lateral Lower Leg Wound Open Wounding Event: Gradually Appeared Status: Date Acquired: 09/04/2020 Comorbid Anemia, Congestive Heart Failure, Hypertension, Peripheral Weeks Of Treatment: 0 History: Venous Disease, End Stage Renal Disease History: Venous Disease, End Stage Renal Disease Clustered Wound: No Wound Measurements Length: (cm) 1 Width: (cm) 0.6 Depth: (cm) 0.1 Area: (cm) 0.471 Volume: (cm) 0.047 % Reduction in Area: 0% % Reduction in Volume: 0% Epithelialization: Small (1-33%) Tunneling: No Undermining: No Wound Description Classification: Full Thickness Without Exposed Support Structures Wound Margin: Flat and Intact Exudate Amount: Small Exudate Type: Serosanguineous Exudate Color: red, brown Foul Odor After Cleansing: No Slough/Fibrino No Wound Bed Granulation Amount: Large (67-100%) Exposed Structure Granulation Quality: Red Fascia Exposed: No Necrotic Amount: None Present (0%) Fat Layer (Subcutaneous Tissue) Exposed: Yes Tendon Exposed: No Muscle Exposed: No Joint Exposed: No Bone Exposed: No Treatment Notes Wound #11 (Lower Leg) Wound Laterality: Right, Lateral, Proximal Cleanser Soap and Water Discharge Instruction: May shower and wash wound with dial antibacterial soap and water prior to dressing change. Peri-Wound Care Sween Lotion (Moisturizing lotion) Discharge Instruction: Apply moisturizing lotion as directed Topical Primary Dressing Hydrofera Blue Classic Foam, 2x2 in Discharge Instruction: Moisten with saline prior to applying to wound bed Secondary Dressing Woven Gauze Sponge, Non-Sterile 4x4 in Discharge Instruction: Apply over primary dressing as directed. Secured  With Compression Wrap FourPress (4 layer compression wrap) Discharge Instruction: Apply four layer compression as directed. Compression Stockings Add-Ons Electronic Signature(s) Signed: 09/04/2020 6:02:34 PM By: Troy Gouty RN, BSN Signed: 09/04/2020 6:28:52 PM By: [name] Entered By: [name] on 09/04/2020 08:32:47 -------------------------------------------------------------------------------- Wound Assessment Details Patient Name: Date of Service: [place] 09/04/2020 8:30 [place] Record Number: [id number] Patient Account Number: [id number] Date of Birth/Sex: Treating RN: 04-27-1941 (80 y.o. [name] Primary Care [name]: [name]., [name]  Other Clinician: Referring [name]: Treating Adonte Vanriper/Extender: Troy Leitz., [name] in Treatment: 47 Wound Status Wound Number: 4 Primary Venous Leg Ulcer Etiology: Wound Location: Right, Distal, Lateral Lower Leg Wound Open Wounding Event: Blister Status: Date Acquired: 09/11/2019 Comorbid Anemia, Congestive Heart Failure, Hypertension, Peripheral Weeks Of Treatment: 47 History: Venous Disease, End Stage Renal Disease Clustered Wound: No Wound Measurements Length: (cm) 1.3 Width: (cm) 0.6 Depth: (cm) 0.1 Area: (cm) 0.613 Volume: (cm) 0.061 % Reduction in Area: 93.4% % Reduction in Volume: 93.4% Epithelialization: Small (1-33%) Tunneling: No Undermining: No Wound Description Classification: Full Thickness Without Exposed Support Structures Wound Margin: Flat and Intact Exudate Amount: Small Exudate Type: Serosanguineous Exudate Color: red, brown Foul Odor After Cleansing: No Slough/Fibrino Yes Wound Bed Granulation Amount: Large (67-100%) Exposed Structure Granulation Quality: Red Fascia Exposed: No Necrotic Amount: None Present (0%) Fat Layer (Subcutaneous Tissue) Exposed: Yes Tendon Exposed: No Muscle Exposed: No Joint Exposed: No Bone Exposed:  No Treatment Notes Wound #4 (Lower Leg) Wound Laterality: Right, Lateral, Distal Cleanser Soap and Water Discharge Instruction: May shower and wash wound with dial antibacterial soap and water prior to dressing change. Peri-Wound Care Sween Lotion (Moisturizing lotion) Discharge Instruction: Apply moisturizing lotion as directed Topical Primary Dressing Hydrofera Blue Classic Foam, 2x2 in Discharge Instruction: Moisten with saline prior to applying to wound bed Secondary Dressing Woven Gauze Sponge, Non-Sterile 4x4 in Discharge Instruction: Apply over primary dressing as directed. Secured With Compression Wrap FourPress (4 layer compression wrap) Discharge Instruction: Apply four layer compression as directed. Compression Stockings Add-Ons Electronic Signature(s) Signed: 09/04/2020 6:02:34 PM By: Troy Gouty RN, BSN Signed: 09/04/2020 6:28:52 PM By: [name] Entered By: [name] on 09/04/2020 08:33:14 -------------------------------------------------------------------------------- Wound Assessment Details Patient Name: Date of Service: [place] 09/04/2020 8:30 [place] Record Number: [id number] Patient Account Number: [id number] Date of Birth/Sex: Treating RN: 02/25/1941 (80 y.o. [name], [name] Primary Care Remmy Riffe: [name]., [name] Other Clinician: Referring [name]: Treating Adiah Guereca/Extender: Troy Leitz., [name] in Treatment: 47 Wound Status Wound Number: 9 Primary Venous Leg Ulcer Etiology: Wound Location: Left, Anterior Lower Leg Wound Healed - Epithelialized Wounding Event: Gradually Appeared Status: Date Acquired: 08/06/2020 Comorbid Anemia, Congestive Heart Failure, Hypertension, Peripheral Weeks Of Treatment: 3 History: Venous Disease, End Stage Renal Disease Clustered Wound: No Wound Measurements Length: (cm) Width: (cm) Depth: (cm) Area: (cm) Volume: (cm) 0 % Reduction in Area: 100% 0 %  Reduction in Volume: 100% 0 Epithelialization: Large (67-100%) 0 Tunneling: No 0 Undermining: No Wound Description Classification: Full Thickness Without Exposed Support Structures Wound Margin: Distinct, outline attached Exudate Amount: None Present Foul Odor After Cleansing: No Slough/Fibrino No Wound Bed Granulation Amount: None Present (0%) Exposed Structure Necrotic Amount: None Present (0%) Fascia Exposed: No Fat Layer (Subcutaneous Tissue) Exposed: No Tendon Exposed: No Muscle Exposed: No Joint Exposed: No Bone Exposed: No Electronic Signature(s) Signed: 09/04/2020 6:02:34 PM By: Troy Gouty RN, BSN Signed: 09/04/2020 6:28:52 PM By: [name] Entered By: [name] on 09/04/2020 08:33:40 -------------------------------------------------------------------------------- [place] Details Patient Name: Date of Service: [place] 09/04/2020 8:30 A M Medical Record Number: [id number] Patient Account Number: [id number] Date of Birth/Sex: Treating RN: 03/07/1941 (80 y.o. [name], [name] Primary Care Yamilka Lopiccolo: [name]., [name] Other Clinician: Referring Tamirah George: Treating Lynton Crescenzo/Extender: Troy Leitz., [name] in Treatment: 8 Vital Signs Time Taken: 08:02 Temperature (F): 98.1 Height (in): 71 Pulse (bpm):  106 Weight (lbs): 198 Respiratory Rate (breaths/min): 20 Body Mass Index (BMI): 27.6 Blood Pressure (mmHg): 128/81 Reference Range: 80 - 120 mg / dl Electronic Signature(s) Signed: 09/05/2020 11:05:27 AM By: [name] Entered By: [name] on 09/04/2020 08:02:55

## 2020-09-18 ENCOUNTER — Encounter (HOSPITAL_BASED_OUTPATIENT_CLINIC_OR_DEPARTMENT_OTHER): Payer: Medicare Other | Admitting: Internal Medicine

## 2020-09-25 ENCOUNTER — Other Ambulatory Visit: Payer: Self-pay

## 2020-09-25 ENCOUNTER — Encounter (HOSPITAL_BASED_OUTPATIENT_CLINIC_OR_DEPARTMENT_OTHER): Payer: Medicare Other | Admitting: Internal Medicine

## 2020-09-25 ENCOUNTER — Other Ambulatory Visit (HOSPITAL_COMMUNITY): Payer: Self-pay | Admitting: Internal Medicine

## 2020-09-25 ENCOUNTER — Other Ambulatory Visit: Payer: Self-pay | Admitting: Internal Medicine

## 2020-09-25 DIAGNOSIS — R188 Other ascites: Secondary | ICD-10-CM

## 2020-09-25 DIAGNOSIS — E1151 Type 2 diabetes mellitus with diabetic peripheral angiopathy without gangrene: Secondary | ICD-10-CM | POA: Diagnosis not present

## 2020-09-25 NOTE — Progress Notes (Signed)
BURRIS, MATHERNE ([id number]) Visit Report for 09/25/2020 HPI Details Patient Name: Date of Service: [place], [place] 09/25/2020 8:00 [place] Record Number: [id number] Patient Account Number: [id number] Date of Birth/Sex: Treating RN: [birth year] (80 y.o. [name], [name] Primary Care Provider: [name]., [name] Other Clinician: Referring Provider: Treating Provider/Extender: [name], [name] in Treatment: 20 History of Present Illness HPI Description: ADMISSION 10/11/2019 This is a 80 year old man with a history of a severe cardiomyopathy with an ejection fraction of about 20%, chronic renal failure stage III. He is listed as a type II diabetic in epic although the patient denies this. He also has a history of PVD. He states for the last month he has had wounds on his bilateral lower extremities that started off as blisters which denuded. He has areas on the left lateral calf and 2 on the right lateral. He has an area on the left first met head which he did not know was there he we identified this on intake. He has been using Silvadene cream provided by his primary care physician but he is complaining that this burns. 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 He was acutely ill at that time complicating GI bleed. ABIs in our clinic were 1.16 on the right and 1.13 on the left 2/25; the patient comes in with his areas on the left lateral and right lateral calf. There is also an area over the left first MTP bunion deformity. We have been using Sorbact. His edema control is fairly good 3/4; left lateral and  right lateral calf. Most of his wounds are in the same position tightly adherent nonviable debris. On the right we debrided the superior wound on the left both wounds. The area on his bunion over the left first MTP medially is I think just about closed. We have been using Sorbact without a lot of success changed to Iodoflex under compression 3/11; left lateral and right lateral calf perhaps minor improvement in the surface condition. We have been using Iodoflex. The area over the bunion of the left first MTP has closed over 3/18; left lateral and right lateral calf not much improvement. We have been using Iodoflex. Aggressive debridement last week. 3/25; left lateral and right lateral calf. We have been using Iodoflex. There is some improvement in the superior area on the right and 2 on the lateral left although there is still a lot of debris on the surface. The inferior area on the right is still a completely nonviable surface. 4/8; left lateral and right lateral calfs. Essentially mirror-image looking wounds 2 wounds on each side in close juxtaposition we have been using Iodoflex with some improvement in the very adherent fibrinous debris but not a lot. The patient has arterial studies next Wednesday morning and venous studies next Thursday morning. I have been avoiding any further aggressive debridement until we see the arterial study results. His ABIs were fairly good in this clinic and is dorsalis pedis pulses are palpable but the wound beds are pale. 4/15; left lateral and right lateral calfs. Essentially mirror-image wounds with 2 wounds on each side in close juxtaposition  but with rims of separating normal tissue. We have been using Iodoflex. The base of the wounds has been cleaning up quite nicely ARTERIAL STUDIES were done showing the patient had an ABI on the right of 1.27 with triphasic waveforms and a TBI of 0.90 on the left triphasic waveforms with an ABI of 1.28 and a TBI of 0.87. No  evidence of arterial disease VENOUS REFLUX STUDIES; were done yesterday we do not have this report yet. 4/23; VENOUS REFLUX STUDIES did not show any significant reflux right lower extremity. No evidence of a DVT He did have significant reflux in the common . femoral vein on the left but nothing else was listed as significant. He did not have a DVT. We have been using Iodoflex under compression his wounds are making progress. 4/29; improvements in the wound surface continue. We have been using Iodoflex. He has a 20% out-of-pocket co-pay for Apligraf which is unfortunate. We changed him to Sorbact today 5/6; started on Sorbact last week. Better looking wound surface but not much change in dimensions. 01/24/20-Patient is back at 3 weeks, wound surfaces are about the same but very minimal slough on the right leg wounds, however there is blistering on both legs adjacent to the wounds, patient denies any other symptoms or new symptoms. His studies have been reviewed and do not indicate any significant arterial or venous disease. But he is attending once in 3 weeks with home health changing in between 6/3; patient has 4 wounds 2 on each side of his lateral lower legs. These wounds are somewhat improved. He apparently arrived in clinic last week with a large blister on the right lateral lower leg that had denuded into the wound. They changed to silver alginate. Still under compression. He has straight Medicare and unfortunately has an unlimited co-pay for advanced treatment options. 6/10; his original 4 wounds are all better especially on the left lateral lower leg. 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 6/24; blister on the right lateral lower leg has healed. There is no additional wounds. The traumatic left second toe is also closed. The 4 that he has are all better 7/1; all of the patient's wounds are smaller except for the proximal one on the left lateral calf. We are using silver collagen 7/22; patient has home health. Relatively refractory wounds on the lateral part of both mid calfs. Each of them 2 wounds. The areas on the left are doing much better in fact the distal 1 looks like it is on his way to closure. The right required debridement with adherent fibrinous debris under illumination. We have been using silver collagen. Previous arterial studies were within normal limits he also had venous reflux studies 04/03/2020 upon evaluation today patient appears to be doing excellent with regard to his lower extremity ulcers. He is going to require a little bit of debridement on the wounds on the right especially in order to clear away some biofilm and slough but this is minimal and overall he seems to be doing quite well. 8/19; the patient's wounds on the left lateral lower leg just above closed. However the 2  on the lateral part of the right have a nonviable necrotic surface. This is disappointing. There is also no change in dimensions. He has had arterial studies as well as venous reflux studies 8/26; very disappointing today. Even the more superficial areas on the left are about the same as last week. Still 2 punched-out areas completely unchanged on the right. We have been using silver collagen really making no progress. 9/2; changed to Iodoflex last week better looking wound surfaces especially on the right although they are deep and punched out. The areas on the left are more superficial open 1 of these is almost fully epithelialized although it has been this way for at least 2 weeks. He has a 20% co-pay [Medicare only] unaffordable for a skin  substitute. Had some thought about a snap VAC on the deep areas on the right leg we will try to put this through in the insurance 9/9; punched-out wounds on the bilateral lateral lower extremities. We have been operating as if the these were chronic venous wounds with secondary lymphedema. The areas on the left have been doing well in fact one of them is closed over. We have had no improvement in the areas on the right we have been using Iodoflex to help with surface debridement. The areas on the right are deeper wider. I do not see any evidence of infection. Home health is not been putting the wraps on high enough he has localized lymphedema right above the wounds. He has had arterial and venous studies already 9/16; punched-out mirror-image wounds on his bilateral lateral mid calfs. We have 1 closed on the left lateral the other smaller. For the first time today the areas on the right lateral actually looks some better. I did a biopsy of 1 of these last time although we still do not have this result. HOWEVER he arrives in clinic today complaining of chest pain overnight which felt like a brick on his chest. He also had 2 black watery bowel movements. He does not have nausea or vomiting. He is not describing abdominal pain. He has no prior history of diarrhea heartburn etc. 9/30; sent this patient to the ER on 9/16. 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 He is on twice daily PPIs Protonix. His admission hemoglobin was exceptionally low at 5. 8.. Discharged at 8.1. 4 unit transfusion. 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 His diuretic dose was actually decreased because of elevated creatinine currently at torsemide 20 mg not felt to be a Coumadin candidate. Patient states he feels well. The areas on his left leg are healed. The areas on the right look better. These have been very refractory  wounds. He does not have stockings we will make arrangements for this today 10/7; the areas on the left leg are healed we will transition him into his 20/30 stocking today. Continued improvement in the areas on the right lateral leg 10/21; the areas on the left leg remain healed at this 2-week follow-up however there is a history that he developed some drainage from one of the wound sites with home health therefore going ahead and wrapping him. He was supposed to be on 20/30 stockings although the history here is vague and I am not sure that this represents a stocking failure or not. We have been wrapping his right leg with the one remaining wound is the distal wound. We  have been using silver collagen 10/28 the patient only has 1 wound remaining on the right lower calf. This still has 3 mm of depth which is unchanged but the surface area is smaller. The superior wound on the right lateral is closed. The 2 wounds on the left remain closed and he is using his compression stocking 11/4 1 remaining wound on the right lower calf. Under illumination not a viable surface we have been using silver collagen we had good effect on the other areas but this 1 appears to be stalled. There is no open area on the left leg he is using his own stocking. 11/11; 1 remaining wound on the right lower calf. Surface of this is a lot better than last week but still requiring debridement I am using Iodoflex but looking forward to changing the primary dressing to perhaps endoform 11/18; 1 remaining wound on the right lower leg, venous insufficiency, vigorous debridement last week/Iodoflex. Depth of the wound is therefore larger but the wound looks clean. Still very gritty material at the surface requiring debridement 12/2; the remaining wound on his right lower leg, venous insufficiency. I changed him to endoform 2 weeks ago. We appear to be making nice progress. Wound is measuring smaller 12/16; he comes in today with one of  his two wounds on the left opened infected second area on the left may be threatened as well as the healed one on the right. He has edema in both legs which I think is pitting. He says his weights are stable he has unknown fairly severe cardiomyopathy. His cardiologist is Dr. Brigitte Pulse it does not sound like he is really been carefully followed 12/23; the areas that I was concerned about on the left look a lot better we put some silver alginate and put him back in compression. It may be the compression that actually does the trick here. The remaining open area we have been using Iodoflex and again this looks a lot better. At our suggestion he went to see his primary who did not adjust his diuretic he is still apparently taking 20 mg of a loop diuretic and "sometimes" 40 mg is basically what the patient stated 1/6; the area on the left is closed once again. We will transition him into his 20/30 stocking from elastic therapy both areas on the right are now open which is a deterioration from last time at which time the only 1 was open but they are very superficial. We have been using Hydrofera Blue under compression 1/27; its been 3 weeks since the patient was here. We transitioned him to a 20/30 stocking on the left leg. Still under 4-layer compression on the right. He comes in today with a new small opening on the left leg but reopening on the right. Massive blistering on the right leg. He has severe systemic fluid volume overload. I have looked over the patient's records. He was seen in 2020 by tele health by cardiology. 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 I think he was supposed to have follow-up but that follow-up never seems to have happened. He does not complain of chest pain or shortness of breath but notes increasing edema not just in his lower legs Electronic Signature(s) Signed: 09/25/2020 4:56:03 PM By: Linton Ham MD Entered By:  Linton Ham on 09/25/2020 09:32:15 -------------------------------------------------------------------------------- Physical Exam Details Patient Name: Date of Service: [place], [place] 09/25/2020 8:00 A M Medical Record  Number: [id number] Patient Account Number: [id number] Date of Birth/Sex: Treating RN: 1940/09/27 (80 y.o. [name], [name] Primary Care Provider: [name]., [name] Other Clinician: Referring Provider: Treating Provider/Extender: [name], [name] in Treatment: 50 Constitutional Sitting or standing Blood Pressure is within target range for patient.. Pulse regular and within target range for patient.Marland Kitchen Respirations regular, non-labored and within target range.. Temperature is normal and within the target range for the patient.Marland Kitchen Appears in no distress. Cardiovascular 3 out of 6 pansystolic murmur at the lower left sternal border that increases with inspiration. JVP elevated to the angle of the jaw at 80 degrees. 3+ sacral edema. Edema present in both extremities. This is pitting extending well up into his thighs towards his groin area.. Gastrointestinal (GI) Abdomen is very distended I think he has ascites. No overt tenderness. Notes Exam; he has a reopening on the left anterior lower leg on the right multiple blisters still intact open wounds on the right superior. The inferior 1 on the lateral right is still closed. Electronic Signature(s) Signed: 09/25/2020 4:56:03 PM By: Linton Ham MD Entered By: Linton Ham on 09/25/2020 09:33:37 -------------------------------------------------------------------------------- Physician Orders Details Patient Name: Date of Service: [place] 09/25/2020 8:00 [place] Record Number: [id number] Patient Account Number: [id number] Date of Birth/Sex: Treating RN: September 29, 1940 (80 y.o. [name], [name] Primary Care Provider: [name]., [name] Other Clinician: Referring  Provider: Treating Provider/Extender: [name], [name] in Treatment: 62 Verbal / Phone Orders: No Diagnosis Coding ICD-10 Coding Code Description [phone] Chronic venous hypertension (idiopathic) with inflammation of bilateral lower extremity I89.0 Lymphedema, not elsewhere classified L97.822 Non-pressure chronic ulcer of other part of left lower leg with fat layer exposed L97.812 Non-pressure chronic ulcer of other part of right lower leg with fat layer exposed S90.812D Abrasion, left foot, subsequent encounter I42.9 Cardiomyopathy, unspecified K92.1 [place] Follow-up Appointments Return Appointment in 1 week. Bathing/ Shower/ Hygiene May shower with protection but do not get wound dressing(s) wet. May shower and wash wound with soap and water. - with dressing changes only. Edema Control - Lymphedema / SCD / Other Elevate legs to the level of the heart or above for 30 minutes daily and/or when sitting, a frequency of: - 3-4 times a day throughout the day. Avoid standing for long periods of time. Exercise regularly Moisturize legs daily. - lotion left leg every night before bed. Compression stocking or Garment 20-30 mm/Hg pressure to: - patient to apply compression stocking to left leg daily. Apply in the morning and remove at night. Non Wound Condition Other Non Wound Condition Orders/Instructions: - apply silver alginate to all blister areas to right leg. Home Health New wound care orders this week; continue Home Health for wound care. May utilize formulary equivalent dressing for wound treatment orders unless otherwise specified. - [facility] home health. Wound Treatment Wound #11 - Lower Leg Wound Laterality: Right, Lateral, Proximal Cleanser: Soap and Water (Home Health) 2 x Per Week/30 Days Discharge Instructions: May shower and wash wound with dial antibacterial soap and water prior to dressing change. Peri-Wound Care: Triamcinolone 15 (g) 2 x Per  Week/30 Days Discharge Instructions: Mixed with lotion in clinic only. Use triamcinolone 15 (g) as directed Peri-Wound Care: Sween Lotion (Moisturizing lotion) (Home Health) 2 x Per Week/30 Days Discharge Instructions: Apply moisturizing lotion as directed Prim Dressing: KerraCel Ag Gelling Fiber Dressing, 4x5 in (silver alginate) (Home Health) 2 x Per Week/30 Days  ary Discharge Instructions: Apply silver alginate to wound bed as instructed Secondary Dressing: Woven Gauze Sponge, Non-Sterile 4x4 in (Home Health) 2 x Per Week/30 Days Discharge Instructions: Apply over primary dressing as directed. Secondary Dressing: ABD Pad, 8x10 (Home Health) 2 x Per Week/30 Days Discharge Instructions: Apply over primary dressing as directed. Compression Wrap: FourPress (4 layer compression wrap) (Home Health) 2 x Per Week/30 Days Discharge Instructions: Apply four layer compression as directed. Wound #12 - Lower Leg Wound Laterality: Left, Anterior Cleanser: Soap and Water (Home Health) 2 x Per Week/30 Days Discharge Instructions: May shower and wash wound with dial antibacterial soap and water prior to dressing change. Peri-Wound Care: Triamcinolone 15 (g) 2 x Per Week/30 Days Discharge Instructions: Mixed with lotion in clinic only. Use triamcinolone 15 (g) as directed Peri-Wound Care: Sween Lotion (Moisturizing lotion) (Home Health) 2 x Per Week/30 Days Discharge Instructions: Apply moisturizing lotion as directed Prim Dressing: KerraCel Ag Gelling Fiber Dressing, 4x5 in (silver alginate) (Home Health) 2 x Per Week/30 Days ary Discharge Instructions: Apply silver alginate to wound bed as instructed Secondary Dressing: Woven Gauze Sponge, Non-Sterile 4x4 in (Home Health) 2 x Per Week/30 Days Discharge Instructions: Apply over primary dressing as directed. Secondary Dressing: ABD Pad, 8x10 (Home Health) 2 x Per Week/30 Days Discharge Instructions: Apply over primary dressing as directed. Compression  Wrap: FourPress (4 layer compression wrap) (Home Health) 2 x Per Week/30 Days Discharge Instructions: Apply four layer compression as directed. Wound #4 - Lower Leg Wound Laterality: Right, Lateral, Distal Cleanser: Soap and Water (Home Health) 2 x Per Week/30 Days Discharge Instructions: May shower and wash wound with dial antibacterial soap and water prior to dressing change. Peri-Wound Care: Triamcinolone 15 (g) 2 x Per Week/30 Days Discharge Instructions: Mixed with lotion in clinic only. Use triamcinolone 15 (g) as directed Peri-Wound Care: Sween Lotion (Moisturizing lotion) (Home Health) 2 x Per Week/30 Days Discharge Instructions: Apply moisturizing lotion as directed Prim Dressing: KerraCel Ag Gelling Fiber Dressing, 4x5 in (silver alginate) (Home Health) 2 x Per Week/30 Days ary Discharge Instructions: Apply silver alginate to wound bed as instructed Secondary Dressing: Woven Gauze Sponge, Non-Sterile 4x4 in (Home Health) 2 x Per Week/30 Days Discharge Instructions: Apply over primary dressing as directed. Secondary Dressing: ABD Pad, 8x10 (Home Health) 2 x Per Week/30 Days Discharge Instructions: Apply over primary dressing as directed. Compression Wrap: FourPress (4 layer compression wrap) (Home Health) 2 x Per Week/30 Days Discharge Instructions: Apply four layer compression as directed. [facility] - Referral to Cardiology Dr. Claudie Leach related to retaining fluid/fluid overload bilateral legs and abdomen possibility of ascites. - (ICD10 I42.9 - Cardiomyopathy, unspecified) Laboratory Comprehensive 2000 panel in Serum or Plasma (CHEM-panel) - (ICD10 I87.323 - Chronic venous hypertension (idiopathic) with inflammation of bilateral lower extremity) LOINC Code: [phone] Convenience Name: Comprehensive metabolic panel=CMS CBC W A Differential panel in Blood (HEM-CBC) - (ICD10 I87.323 - Chronic venous hypertension (idiopathic) with inflammation of bilateral  lower uto extremity) LOINC Code: 23557-3 Convenience Name: CBC W Auto Differential panel [facility] abdominal ultrasound - Abdominal ultrasound related to edematous abdomen questioning ascites. ICD 10 code CPT code BNP - BNP labwork - (ICD10 I87.323 - Chronic venous hypertension (idiopathic) with inflammation of bilateral lower extremity) Electronic Signature(s) Signed: 09/25/2020 4:56:03 PM By: Linton Ham MD Signed: 09/25/2020 5:42:01 PM By: Deon Pilling Entered By: Deon Pilling on 09/25/2020 15:02:38 Prescription 09/25/2020 -------------------------------------------------------------------------------- Kathi Simpers MD Patient Name: Provider: 08-Feb-1941 [id number] Date of Birth: NPI#: [id number]  Sex: DEA #: [phone] [id number] Phone #: License #: [place] Patient Address: [address], [place] [address] [place], [place] [address] [phone] Allergies No Known Allergies Provider's Orders abdominal ultrasound - Abdominal ultrasound related to edematous abdomen questioning ascites. ICD 10 code CPT code Hand Signature: Date(s): Prescription 09/25/2020 Kathi Simpers MD Patient Name: Provider: 1940/12/17 [id number] Date of Birth: NPI#: [name] [id number] Sex: DEA #: [phone] Phone #: License #: [place] Patient Address: [address] [place], [place] [address] [place], [place] [address] [phone] Allergies No Known Allergies Provider's Orders Comprehensive 2000 panel in Serum or Plasma - ICD10: I87.323 LOINC Code: 86767-2 Convenience Name: Comprehensive metabolic panel=CMS Hand Signature: Date(s): Prescription 09/25/2020 Kathi Simpers MD Patient Name: Provider: [birth year] [id number] Date of Birth: NPI#: [name]  [id number] Sex: DEA #: [phone] Phone #: License #: [place] Patient Address: [address] [place], [place] [address] [place], [place] [address] [phone] Allergies No Known Allergies Provider's Orders CBC W A Differential panel in Blood - ICD10: I87.323 uto LOINC Code: 99357-0 Convenience Name: CBC W Auto Differential panel Hand Signature: Date(s): Prescription 09/25/2020 Kathi Simpers MD Patient Name: Provider: Oct 02, 1940 [id number] Date of Birth: NPI#: [name] [id number] Sex: DEA #: [phone] [id number] Phone #: License #: [place] Patient Address: [address], [place] [address] [place], [place] [address] [phone] Allergies No Known Allergies Provider's Orders Cardiology- [name] - ICD10: I42.9 - Referral to Cardiology Dr. Claudie Leach related to retaining fluid/fluid overload bilateral legs and abdomen possibility of ascites. Hand Signature: Date(s): Prescription 09/25/2020 Kathi Simpers MD Patient Name: Provider: 1940-12-04 [id number] Date of Birth: NPI#: [name] [id number] Sex: DEA #: [phone] [id number] Phone #: License #: [place] Patient Address: [address] [place], [place] [address] [place], [place] [address] [phone] Allergies No Known Allergies Provider's Orders BNP - ICD10: I87.323 - BNP labwork Hand Signature: Date(s): Electronic Signature(s) Signed: 09/25/2020 4:56:03 PM By: Linton Ham MD Signed: 09/25/2020 5:42:01 PM By: Deon Pilling Entered By: Deon Pilling on 09/25/2020 15:02:39 -------------------------------------------------------------------------------- Problem List Details Patient Name: Date of Service: [place]  09/25/2020 8:00 A M Medical Record Number: [id number] Patient Account Number: [id number] Date of Birth/Sex: Treating RN: Jun 19, 1941 (80 y.o. 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/06/2020 No Yes L97.812 Non-pressure chronic ulcer of other part of right lower leg with fat layer 01/03/2020 No Yes exposed S90.812D Abrasion, left foot, subsequent encounter 02/07/2020 No Yes I42.9 Cardiomyopathy, unspecified 05/15/2020 No Yes K92.1 Melena 05/15/2020 No Yes Inactive Problems ICD-10 Code Description Active Date Inactive Date L97.521 Non-pressure chronic ulcer of other part of left foot limited to breakdown of skin 10/11/2019 10/11/2019 Resolved Problems ICD-10 Code Description  Active Date Resolved Date 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 10/11/2019 10/11/2019 Electronic Signature(s) Signed: 09/25/2020 4:56:03 PM By: Linton Ham MD Entered By: Linton Ham on 09/25/2020 09:29:05 -------------------------------------------------------------------------------- Progress Note Details Patient Name: Date of Service: [place] 09/25/2020 8:00 [place] Record Number: [id number] Patient Account Number: [id number] Date of Birth/Sex: Treating RN: 06/15/1941 (80 y.o. [name], [name] Primary Care Provider: [name]., [name] Other Clinician: Referring Provider: Treating Provider/Extender: [name], [name] in Treatment:  50 Subjective History of Present Illness (HPI) ADMISSION 10/11/2019 This is a 80 year old man with a history of a severe cardiomyopathy with an ejection fraction of about 20%, chronic renal failure stage III. He is listed as a type II diabetic in epic although the patient denies this. He also has a history of PVD. He states for the last month he has had wounds on his bilateral lower extremities that started off as blisters which denuded. He has areas on the left lateral calf and 2 on the right lateral. He has an area on the left first met head which he did not know was there he we identified this on intake. He has been using Silvadene cream provided by his primary care physician but he is complaining that this burns. 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 He was acutely ill at that time complicating GI bleed. ABIs in our clinic were 1.16 on the right and 1.13 on the left 2/25; the patient comes in with his areas on the left lateral and right lateral calf. There is also an area over the left first MTP bunion deformity. We have been using Sorbact. His edema control is fairly good 3/4; left lateral and right lateral calf. Most of his wounds are in the same position tightly adherent nonviable debris. On the right we debrided the superior wound on the left both wounds. The area on his bunion over the left first MTP medially is I think just about closed. We have been using Sorbact without a lot of success changed to Iodoflex under compression 3/11; left lateral and right lateral calf perhaps minor improvement in the surface condition. We have been  using Iodoflex. The area over the bunion of the left first MTP has closed over 3/18; left lateral and right lateral calf not much improvement. We have been using Iodoflex. Aggressive debridement last week. 3/25; left lateral and right lateral calf. We have been using Iodoflex. There is some improvement in the superior area on the right and 2 on the lateral left although there is still a lot of debris on the surface. The inferior area on the right is still a completely nonviable surface. 4/8; left lateral and right lateral calfs. Essentially mirror-image looking wounds 2 wounds on each side in close juxtaposition we have been using Iodoflex with some improvement in the very adherent fibrinous debris but not a lot. The patient has arterial studies next Wednesday morning and venous studies next Thursday morning. I have been avoiding any further aggressive debridement until we see the arterial study results. His ABIs were fairly good in this clinic and is  dorsalis pedis pulses are palpable but the wound beds are pale. 4/15; left lateral and right lateral calfs. Essentially mirror-image wounds with 2 wounds on each side in close juxtaposition but with rims of separating normal tissue. We have been using Iodoflex. The base of the wounds has been cleaning up quite nicely ARTERIAL STUDIES were done showing the patient had an ABI on the right of 1.27 with triphasic waveforms and a TBI of 0.90 on the left triphasic waveforms with an ABI of 1.28 and a TBI of 0.87. No evidence of arterial disease VENOUS REFLUX STUDIES; were done yesterday we do not have this report yet. 4/23; VENOUS REFLUX STUDIES did not show any significant reflux right lower extremity. No evidence of a DVT He did have significant reflux in the common . femoral vein on the left but nothing else was listed as significant. He did not have a DVT. We have been using Iodoflex under compression his wounds are making progress. 4/29; improvements  in the wound surface continue. We have been using Iodoflex. He has a 20% out-of-pocket co-pay for Apligraf which is unfortunate. We changed him to Sorbact today 5/6; started on Sorbact last week. Better looking wound surface but not much change in dimensions. 01/24/20-Patient is back at 3 weeks, wound surfaces are about the same but very minimal slough on the right leg wounds, however there is blistering on both legs adjacent to the wounds, patient denies any other symptoms or new symptoms. His studies have been reviewed and do not indicate any significant arterial or venous disease. But he is attending once in 3 weeks with home health changing in between 6/3; patient has 4 wounds 2 on each side of his lateral lower legs. These wounds are somewhat improved. He apparently arrived in clinic last week with a large blister on the right lateral lower leg that had denuded into the wound. They changed to silver alginate. Still under compression. He has straight Medicare and unfortunately has an unlimited co-pay for advanced treatment options. 6/10; his original 4 wounds are all better especially on the left lateral lower leg. 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 6/24; blister on the right lateral lower leg has healed. There is no additional wounds. The traumatic left second toe is also closed. The 4 that he has are all better 7/1; all of the patient's wounds are smaller except for the proximal one on the left lateral calf. We are using silver collagen 7/22; patient has home health. Relatively refractory wounds on the lateral part of both mid calfs. Each of them 2  wounds. The areas on the left are doing much better in fact the distal 1 looks like it is on his way to closure. The right required debridement with adherent fibrinous debris under illumination. We have been using silver collagen. Previous arterial studies were within normal limits he also had venous reflux studies 04/03/2020 upon evaluation today patient appears to be doing excellent with regard to his lower extremity ulcers. He is going to require a little bit of debridement on the wounds on the right especially in order to clear away some biofilm and  slough but this is minimal and overall he seems to be doing quite well. 8/19; the patient's wounds on the left lateral lower leg just above closed. However the 2 on the lateral part of the right have a nonviable necrotic surface. This is disappointing. There is also no change in dimensions. He has had arterial studies as well as venous reflux studies 8/26; very disappointing today. Even the more superficial areas on the left are about the same as last week. Still 2 punched-out areas completely unchanged on the right. We have been using silver collagen really making no progress. 9/2; changed to Iodoflex last week better looking wound surfaces especially on the right although they are deep and punched out. The areas on the left are more superficial open 1 of these is almost fully epithelialized although it has been this way for at least 2 weeks. He has a 20% co-pay [Medicare only] unaffordable for a skin substitute. Had some thought about a snap VAC on the deep areas on the right leg we will try to put this through in the insurance 9/9; punched-out wounds on the bilateral lateral lower extremities. We have been operating as if the these were chronic venous wounds with secondary lymphedema. The areas on the left have been doing well in fact one of them is closed over. We have had no improvement in the areas on the right we have been using Iodoflex to help  with surface debridement. The areas on the right are deeper wider. I do not see any evidence of infection. Home health is not been putting the wraps on high enough he has localized lymphedema right above the wounds. He has had arterial and venous studies already 9/16; punched-out mirror-image wounds on his bilateral lateral mid calfs. We have 1 closed on the left lateral the other smaller. For the first time today the areas on the right lateral actually looks some better. I did a biopsy of 1 of these last time although we still do not have this result. HOWEVER he arrives in clinic today complaining of chest pain overnight which felt like a brick on his chest. He also had 2 black watery bowel movements. He does not have nausea or vomiting. He is not describing abdominal pain. He has no prior history of diarrhea heartburn etc. 9/30; sent this patient to the ER on 9/16. 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 He is on twice daily PPIs Protonix. His admission hemoglobin was exceptionally low at 5. 8.. Discharged at 8.1. 4 unit transfusion. 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 His diuretic dose was actually decreased because of elevated creatinine currently at torsemide 20 mg not felt to be a Coumadin candidate. Patient states he feels well. The areas on his left leg are healed. The areas on the right look better. These have been very refractory wounds. He does not have stockings we will make arrangements for this today 10/7; the areas on the left leg are healed we will transition him into his 20/30 stocking today. Continued improvement in the areas on the right lateral leg 10/21; the areas on the left leg remain healed at this 2-week follow-up however there is a history that he developed some drainage from one of the wound sites with home health therefore going ahead and wrapping him. He was  supposed to be on 20/30 stockings although the history here is vague  and I am not sure that this represents a stocking failure or not. We have been wrapping his right leg with the one remaining wound is the distal wound. We have been using silver collagen 10/28 the patient only has 1 wound remaining on the right lower calf. This still has 3 mm of depth which is unchanged but the surface area is smaller. The superior wound on the right lateral is closed. The 2 wounds on the left remain closed and he is using his compression stocking 11/4 1 remaining wound on the right lower calf. Under illumination not a viable surface we have been using silver collagen we had good effect on the other areas but this 1 appears to be stalled. There is no open area on the left leg he is using his own stocking. 11/11; 1 remaining wound on the right lower calf. Surface of this is a lot better than last week but still requiring debridement I am using Iodoflex but looking forward to changing the primary dressing to perhaps endoform 11/18; 1 remaining wound on the right lower leg, venous insufficiency, vigorous debridement last week/Iodoflex. Depth of the wound is therefore larger but the wound looks clean. Still very gritty material at the surface requiring debridement 12/2; the remaining wound on his right lower leg, venous insufficiency. I changed him to endoform 2 weeks ago. We appear to be making nice progress. Wound is measuring smaller 12/16; he comes in today with one of his two wounds on the left opened infected second area on the left may be threatened as well as the healed one on the right. He has edema in both legs which I think is pitting. He says his weights are stable he has unknown fairly severe cardiomyopathy. His cardiologist is Dr. Brigitte Pulse it does not sound like he is really been carefully followed 12/23; the areas that I was concerned about on the left look a lot better we put some silver alginate and put him  back in compression. It may be the compression that actually does the trick here. The remaining open area we have been using Iodoflex and again this looks a lot better. At our suggestion he went to see his primary who did not adjust his diuretic he is still apparently taking 20 mg of a loop diuretic and "sometimes" 40 mg is basically what the patient stated 1/6; the area on the left is closed once again. We will transition him into his 20/30 stocking from elastic therapy both areas on the right are now open which is a deterioration from last time at which time the only 1 was open but they are very superficial. We have been using Hydrofera Blue under compression 1/27; its been 3 weeks since the patient was here. We transitioned him to a 20/30 stocking on the left leg. Still under 4-layer compression on the right. He comes in today with a new small opening on the left leg but reopening on the right. Massive blistering on the right leg. He has severe systemic fluid volume overload. I have looked over the patient's records. He was seen in 2020 by tele health by cardiology. 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 I think he was supposed to have follow-up but that follow-up never seems to have happened. He does not complain of chest pain or shortness of breath but notes increasing edema not just in his lower legs Objective Constitutional Sitting or standing Blood Pressure is within  target range for patient.. Pulse regular and within target range for patient.Marland Kitchen Respirations regular, non-labored and within target range.. Temperature is normal and within the target range for the patient.Marland Kitchen Appears in no distress. Vitals Time Taken: 8:03 AM, Height: 71 in, Weight: 198 lbs, BMI: 27.6, Temperature: 98.1 F, Pulse: 93 bpm, Respiratory Rate: 20 breaths/min, Blood Pressure: 119/77 mmHg. Cardiovascular 3 out of 6 pansystolic murmur at the lower left sternal  border that increases with inspiration. JVP elevated to the angle of the jaw at 80 degrees. 3+ sacral edema. Edema present in both extremities. This is pitting extending well up into his thighs towards his groin area.. Gastrointestinal (GI) Abdomen is very distended I think he has ascites. No overt tenderness. General Notes: Exam; he has a reopening on the left anterior lower leg on the right multiple blisters still intact open wounds on the right superior. The inferior 1 on the lateral right is still closed. Integumentary (Hair, Skin) Wound #11 status is Open. Original cause of wound was Gradually Appeared. The wound is located on the Right,Proximal,Lateral Lower Leg. The wound measures 1.5cm length x 1.2cm width x 0.1cm depth; 1.414cm^2 area and 0.141cm^3 volume. There is Fat Layer (Subcutaneous Tissue) exposed. There is no tunneling or undermining noted. There is a medium amount of serous drainage noted. The wound margin is flat and intact. There is large (67-100%) pink, pale granulation within the wound bed. There is no necrotic tissue within the wound bed. Wound #12 status is Open. Original cause of wound was Gradually Appeared. The wound is located on the Left,Anterior Lower Leg. The wound measures 1cm length x 1.5cm width x 0.1cm depth; 1.178cm^2 area and 0.118cm^3 volume. There is Fat Layer (Subcutaneous Tissue) exposed. There is no tunneling or undermining noted. There is a medium amount of serosanguineous drainage noted. The wound margin is distinct with the outline attached to the wound base. There is large (67-100%) red granulation within the wound bed. There is no necrotic tissue within the wound bed. Wound #4 status is Open. Original cause of wound was Blister. The wound is located on the Right,Distal,Lateral Lower Leg. The wound measures 1.4cm length x 1.5cm width x 0.1cm depth; 1.649cm^2 area and 0.165cm^3 volume. There is Fat Layer (Subcutaneous Tissue) exposed. There is no tunneling  or undermining noted. There is a medium amount of serous drainage noted. The wound margin is flat and intact. There is large (67-100%) pink, pale granulation within the wound bed. There is no necrotic tissue within the wound bed. Assessment Active Problems ICD-10 Chronic venous hypertension (idiopathic) with inflammation of bilateral lower extremity Lymphedema, not elsewhere classified Non-pressure chronic ulcer of other part of left lower leg with fat layer exposed Non-pressure chronic ulcer of other part of right lower leg with fat layer exposed Abrasion, left foot, subsequent encounter Cardiomyopathy, unspecified Melena Procedures Wound #11 Pre-procedure diagnosis of Wound #11 is a Venous Leg Ulcer located on the Right,Proximal,Lateral Lower Leg . There was a Four Layer Compression Therapy Procedure by Baruch Gouty, RN. Post procedure Diagnosis Wound #11: Same as Pre-Procedure Wound #12 Pre-procedure diagnosis of Wound #12 is a Venous Leg Ulcer located on the Left,Anterior Lower Leg . There was a Four Layer Compression Therapy Procedure by Baruch Gouty, RN. Post procedure Diagnosis Wound #12: Same as Pre-Procedure Wound #4 Pre-procedure diagnosis of Wound #4 is a Venous Leg Ulcer located on the Right,Distal,Lateral Lower Leg . There was a Four Layer Compression Therapy Procedure by Baruch Gouty, RN. Post procedure Diagnosis Wound #4: Same  as Pre-Procedure Plan Follow-up Appointments: Return Appointment in 1 week. Bathing/ Shower/ Hygiene: May shower with protection but do not get wound dressing(s) wet. May shower and wash wound with soap and water. - with dressing changes only. Edema Control - Lymphedema / SCD / Other: Elevate legs to the level of the heart or above for 30 minutes daily and/or when sitting, a frequency of: - 3-4 times a day throughout the day. Avoid standing for long periods of time. Exercise regularly Moisturize legs daily. - lotion left leg every  night before bed. Compression stocking or Garment 20-30 mm/Hg pressure to: - patient to apply compression stocking to left leg daily. Apply in the morning and remove at night. Non Wound Condition: Other Non Wound Condition Orders/Instructions: - apply silver alginate to all blister areas to right leg. Home Health: New wound care orders this week; continue Home Health for wound care. May utilize formulary equivalent dressing for wound treatment orders unless otherwise specified. - [facility] home [facility]. ordered were: abdominal ultrasound - Abdominal ultrasound related to edematous abdomen questioning ascites. ICD 10 code CPT code, BNP - BNP labwork Laboratory ordered were: Comprehensive metabolic panel=CMS, CBC W Auto Differential panel Consults ordered were: Cardiology-  - Referral to Cardiology Dr. Gabrielle Dare related to retaining fluid/fluid overload bilateral legs and abdomen possibility of ascites. WOUND #11: - Lower Leg Wound Laterality: Right, Lateral, Proximal Cleanser: Soap and Water (Home Health) 2 x Per Week/30 Days Discharge Instructions: May shower and wash wound with dial antibacterial soap and water prior to dressing change. Peri-Wound Care: Triamcinolone 15 (g) 2 x Per Week/30 Days Discharge Instructions: Mixed with lotion in clinic only. Use triamcinolone 15 (g) as directed Peri-Wound Care: Sween Lotion (Moisturizing lotion) (Home Health) 2 x Per Week/30 Days Discharge Instructions: Apply moisturizing lotion as directed Prim Dressing: KerraCel Ag Gelling Fiber Dressing, 4x5 in (silver alginate) (Home Health) 2 x Per Week/30 Days ary Discharge Instructions: Apply silver alginate to wound bed as instructed Secondary Dressing: Woven Gauze Sponge, Non-Sterile 4x4 in (Home Health) 2 x Per Week/30 Days Discharge Instructions: Apply over primary dressing as directed. Secondary Dressing: ABD Pad, 8x10 (Home Health) 2 x Per Week/30 Days Discharge Instructions: Apply over  primary dressing as directed. Com pression Wrap: FourPress (4 layer compression wrap) (Home Health) 2 x Per Week/30 Days Discharge Instructions: Apply four layer compression as directed. WOUND #12: - Lower Leg Wound Laterality: Left, Anterior Cleanser: Soap and Water (Home Health) 2 x Per Week/30 Days Discharge Instructions: May shower and wash wound with dial antibacterial soap and water prior to dressing change. Peri-Wound Care: Triamcinolone 15 (g) 2 x Per Week/30 Days Discharge Instructions: Mixed with lotion in clinic only. Use triamcinolone 15 (g) as directed Peri-Wound Care: Sween Lotion (Moisturizing lotion) (Home Health) 2 x Per Week/30 Days Discharge Instructions: Apply moisturizing lotion as directed Prim Dressing: KerraCel Ag Gelling Fiber Dressing, 4x5 in (silver alginate) (Home Health) 2 x Per Week/30 Days ary Discharge Instructions: Apply silver alginate to wound bed as instructed Secondary Dressing: Woven Gauze Sponge, Non-Sterile 4x4 in (Home Health) 2 x Per Week/30 Days Discharge Instructions: Apply over primary dressing as directed. Secondary Dressing: ABD Pad, 8x10 (Home Health) 2 x Per Week/30 Days Discharge Instructions: Apply over primary dressing as directed. Com pression Wrap: FourPress (4 layer compression wrap) (Home Health) 2 x Per Week/30 Days Discharge Instructions: Apply four layer compression as directed. WOUND #4: - Lower Leg Wound Laterality: Right, Lateral, Distal Cleanser: Soap and Water (Home Health) 2 x Per  Week/30 Days Discharge Instructions: May shower and wash wound with dial antibacterial soap and water prior to dressing change. Peri-Wound Care: Triamcinolone 15 (g) 2 x Per Week/30 Days Discharge Instructions: Mixed with lotion in clinic only. Use triamcinolone 15 (g) as directed Peri-Wound Care: Sween Lotion (Moisturizing lotion) (Home Health) 2 x Per Week/30 Days Discharge Instructions: Apply moisturizing lotion as directed Prim Dressing:  KerraCel Ag Gelling Fiber Dressing, 4x5 in (silver alginate) (Home Health) 2 x Per Week/30 Days ary Discharge Instructions: Apply silver alginate to wound bed as instructed Secondary Dressing: Woven Gauze Sponge, Non-Sterile 4x4 in (Home Health) 2 x Per Week/30 Days Discharge Instructions: Apply over primary dressing as directed. Secondary Dressing: ABD Pad, 8x10 (Home Health) 2 x Per Week/30 Days Discharge Instructions: Apply over primary dressing as directed. Com pression Wrap: FourPress (4 layer compression wrap) (Home Health) 2 x Per Week/30 Days Discharge Instructions: Apply four layer compression as directed. 1. The patient's wounds have reopened on the left anterior. Multiple blisters on the right lower leg. Fortunately the areas on the right lateral leg which were his original wounds from 3 weeks ago are not too bad the superior 1 is open the inferior 1 is not but if we cannot control this edema I think he is going to have breakdown in his bilateral lower legs all over again. 2. 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 There was an echocardiogram done on him last in October 2020. This showed an ejection fraction less than 20%. Left ventricular cavity was severely dilated. 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 There was global right ventricular reduced systolic function with severe right ventricular enlargement. I have sent a text message to Dr. Anne Fu who is listed as his cardiologist. I had like him seen ASAP I think this may deteriorate 3. I have ordered a comprehensive metabolic panel CBC, BNP. He appears to have right greater than left heart failure. 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 I asked the patient about this. He has stopped drinking and no longer smokes but he was  drinking 2 years ago fifth every second day. 4. The deterioration in his wounds is secondary to systemic fluid volume overload. I tried to get him to see his primary doctor about this 2 months ago but nothing seems to have happened. I have told him to increase his Lasix to 40 mg a day I have asked for a comprehensive metabolic panel CBC BNP and I am trying to get him to see cardiology ASAP Electronic Signature(s) Signed: 09/25/2020 4:56:03 PM By: Baltazar Najjar MD Entered By: Baltazar Najjar on 09/25/2020 09:36:55 -------------------------------------------------------------------------------- SuperBill Details Patient Name: Date of Service: [place] [facility]ERS 09/25/2020 Medical Record Number: [id number] Patient Account Number: [id number] Date of Birth/Sex: Treating RN: June 15, 1941 (80 y.o. Harlon Flor, Millard.Loa Primary Care Provider: Marcelline Mates., [name] Other Clinician: Referring Provider: Treating Provider/Extender: [name] Blase., [name] in Treatment: 50 Diagnosis Coding ICD-10 Codes Code Description [phone] Chronic venous hypertension (idiopathic) with inflammation of bilateral lower extremity I89.0 Lymphedema, not elsewhere classified L97.822 Non-pressure chronic ulcer of other part of left lower leg with fat layer exposed L97.812 Non-pressure chronic ulcer of other part of right lower leg with fat layer exposed S90.812D Abrasion, left foot, subsequent encounter I42.9 Cardiomyopathy, unspecified K92.1 [facility] Facility  Procedures The patient participates with Medicare or their insurance follows the Medicare Facility Guidelines: CPT4 Description Modifier Quantity Code [id number] BILATERAL: Application of multi-layer venous compression system; leg (below knee), including ankle and 1 foot. Physician Procedures : CPT4 Code Description Modifier [id number] - WC PHYS LEVEL 4 - EST PT ICD-10 Diagnosis Description L97.822 Non-pressure chronic ulcer of other part of  left lower leg with fat layer exposed L97.812 Non-pressure chronic ulcer of other part of right  lower leg with fat layer exposed I87.323 Chronic venous hypertension (idiopathic) with inflammation of bilateral lower extremity I42.9 Cardiomyopathy, unspecified Quantity: 1 Electronic Signature(s) Signed: 09/25/2020 4:56:03 PM By: Linton Ham MD Entered By: Linton Ham on 09/25/2020 09:37:20

## 2020-09-25 NOTE — Progress Notes (Signed)
[name], KUEHL ([id number]) Visit Report for 09/25/2020 Arrival Information Details Patient Name: Date of Service: [place] 09/25/2020 8:00 [name] Number: [id number] Patient Account Number: [id number] Date of Birth/Sex: Treating RN: 1940-11-17 (80 y.o. [name] Primary Care Provider: [name]., [name] Other Clinician: Referring Provider: Treating Provider/Extender: [name] Leitz., [name] in Treatment: 52 Visit Information History Since Last Visit Added or deleted any medications: No Patient Arrived: Walker Any new allergies or adverse reactions: No Arrival Time: 08:03 Had a fall or experienced change in No Accompanied By: self activities of daily living that may affect Transfer Assistance: None risk of falls: Patient Identification Verified: Yes Signs or symptoms of abuse/neglect since last visito No Secondary Verification Process Completed: Yes Hospitalized since last visit: No Patient Requires Transmission-Based Precautions: No Implantable device outside of the clinic excluding No Patient Has Alerts: Yes cellular tissue based products placed in the center Patient Alerts: Patient on Blood Thinner since last visit: Has Dressing in Place as Prescribed: Yes Pain Present Now: No Electronic Signature(s) Signed: 09/25/2020 3:17:01 PM By: Sandre Kitty Entered By: Sandre Kitty on 09/25/2020 08:03:57 -------------------------------------------------------------------------------- Compression Therapy Details Patient Name: Date of Service: [place] [name] 09/25/2020 8:00 [place] Patton Number: [id number] Patient Account Number: [id number] Date of Birth/Sex: Treating RN: 05/06/1941 (80 y.o. [name] Primary Care Provider: [name]., [name] Other Clinician: Referring Provider: Treating Provider/Extender: [name] Leitz., [name] in Treatment: 50 Compression Therapy Performed for Wound  Assessment: Wound #11 Right,Proximal,Lateral Lower Leg Performed By: Clinician Baruch Gouty, RN Compression Type: Four Layer Post Procedure Diagnosis Same as Pre-procedure Electronic Signature(s) Signed: 09/25/2020 5:42:01 PM By: Deon Pilling Entered By: Deon Pilling on 09/25/2020 08:39:32 -------------------------------------------------------------------------------- Compression Therapy Details Patient Name: Date of Service: [place] [name] [name] 09/25/2020 8:00 [facility]ton Number: [id number] Patient Account Number: [id number] Date of Birth/Sex: Treating RN: 13-Sep-1940 (80 y.o. [name] Primary Care Provider: [name]., [name] Other Clinician: Referring Provider: Treating Provider/Extender: [name] Leitz., [name] in Treatment: 50 Compression Therapy Performed for Wound Assessment: Wound #4 Right,Distal,Lateral Lower Leg Performed By: Clinician Baruch Gouty, RN Compression Type: Four Layer Post Procedure Diagnosis Same as Pre-procedure Electronic Signature(s) Signed: 09/25/2020 5:42:01 PM By: Deon Pilling Entered By: Deon Pilling on 09/25/2020 08:39:32 -------------------------------------------------------------------------------- Compression Therapy Details Patient Name: Date of Service: [name] 09/25/2020 8:00 [facility]ton Number: [id number] Patient Account Number: [id number] Date of Birth/Sex: Treating RN: April 10, 1941 (80 y.o. [name] Primary Care Provider: [name]., [name] Other Clinician: Referring Provider: Treating Provider/Extender: [name] Leitz., [name] in Treatment: 50 Compression Therapy Performed for Wound Assessment: Wound #12 Left,Anterior Lower Leg Performed By: Clinician Baruch Gouty, RN Compression Type: Four Layer Post Procedure Diagnosis Same as Pre-procedure Electronic Signature(s) Signed: 09/25/2020 5:42:01 PM By: Deon Pilling Entered By: Deon Pilling on 09/25/2020 08:39:32 -------------------------------------------------------------------------------- Encounter Discharge Information Details Patient Name: Date of Service: [place] 09/25/2020 8:00 [place] Patton Number: [id number] Patient Account Number: [id number] Date of Birth/Sex: Treating RN: 1941/02/15 (80 y.o. [name] Primary Care Provider: [name]., [name] Other Clinician: Referring Provider: Treating Provider/Extender: [name] Leitz., [name] in Treatment: 36 Encounter Discharge Information Items Discharge Condition: Stable Ambulatory Status: Walker Discharge Destination: Home Transportation:  Other Accompanied By: self Schedule Follow-up Appointment: Yes Clinical Summary of Care: Patient Declined Notes SCAT Electronic Signature(s) Signed: 09/25/2020 5:45:53 PM By: Baruch Gouty RN, BSN Entered By: Baruch Gouty on 09/25/2020 09:21:57 -------------------------------------------------------------------------------- Lower Extremity Assessment Details Patient Name: Date of Service: [place] 09/25/2020 8:00 [place] Patton Number: [id number] Patient Account Number: [id number] Date of Birth/Sex: Treating RN: [birth year] (80 y.o. [name] Patton Primary Care Provider: [name]., [name] Other Clinician: Referring Provider: Treating Provider/Extender: [name] Leitz., [name] in Treatment: 50 Edema Assessment Assessed: [Left: No] [Right: No] Edema: [Left: Yes] [Right: Yes] Calf Left: Right: Point of Measurement: 53 cm From Medial Instep 40 cm 38 cm Ankle Left: Right: Point of Measurement: 14 cm From Medial Instep 22.8 cm 24 cm Vascular Assessment Pulses: Dorsalis Pedis Palpable: [Right:Yes] Electronic Signature(s) Signed: 09/25/2020 5:48:06 PM By: Levan Hurst RN, BSN Entered By: Levan Hurst on 09/25/2020  08:20:29 -------------------------------------------------------------------------------- Multi Wound Chart Details Patient Name: Date of Service: [place] 09/25/2020 8:00 [facility]ton Number: [id number] Patient Account Number: [id number] Date of Birth/Sex: Treating RN: [birth year] (80 y.o. [name], [name] Primary Care Provider: [name]., [name] Other Clinician: Referring Provider: Treating Provider/Extender: [name] Leitz., [name] in Treatment: 50 Vital Signs Height(in): 71 Pulse(bpm): 93 Weight(lbs): 198 Blood Pressure(mmHg): 119/77 Body Mass Index(BMI): 28 Temperature(F): 98.1 Respiratory Rate(breaths/min): 20 Photos: [11:No Photos Right, Proximal, Lateral Lower Leg] [12:No Photos Left, Anterior Lower Leg] [4:No Photos Right, Distal, Lateral Lower Leg] Wound Location: [11:Gradually Appeared] [12:Gradually Appeared] [4:Blister] Wounding Event: [11:Venous Leg Ulcer] [12:Venous Leg Ulcer] [4:Venous Leg Ulcer] Primary Etiology: [11:N/A] [12:Lymphedema] [4:N/A] Secondary Etiology: [11:Anemia, Congestive Heart Failure,] [12:Anemia, Congestive Heart Failure,] [4:Anemia, Congestive Heart Failure,] Comorbid History: [11:Hypertension, Peripheral Venous Disease, End Stage Renal Disease Disease, End Stage Renal Disease Disease, End Stage Renal Disease 09/04/2020] [12:Hypertension, Peripheral Venous 09/25/2020] [4:Hypertension, Peripheral Venous  09/11/2019] Date Acquired: [11:3] [12:0] [4:50] Weeks of Treatment: [11:Open] [12:Open] [4:Open] Wound Status: [11:1.5x1.2x0.1] [12:1x1.5x0.1] [4:1.4x1.5x0.1] Measurements L x W x D (cm) [11:1.414] [12:1.178] [4:1.649] A (cm) : rea [11:0.141] [12:0.118] [4:0.165] Volume (cm) : [11:-200.20%] [12:N/A] [4:82.30%] % Reduction in Area: [11:-200.00%] [12:N/A] [4:82.30%] % Reduction in Volume: [11:Full Thickness Without Exposed] [12:Full Thickness Without Exposed] [4:Full Thickness Without  Exposed] Classification: [11:Support Structures Medium] [12:Support Structures Medium] [4:Support Structures Medium] Exudate Amount: [11:Serous] [12:Serosanguineous] [4:Serous] Exudate Type: [11:amber] [12:red, brown] [4:amber] Exudate Color: [11:Flat and Intact] [12:Distinct, outline attached] [4:Flat and Intact] Wound Margin: [11:Large (67-100%)] [12:Large (67-100%)] [4:Large (67-100%)] Granulation Amount: [11:Pink, Pale] [12:Red] [4:Pink, Pale] Granulation Quality: [11:None Present (0%)] [12:None Present (0%)] [4:None Present (0%)] Necrotic Amount: [11:Fat Layer (Subcutaneous Tissue): Yes Fat Layer (Subcutaneous Tissue): Yes Fat Layer (Subcutaneous Tissue): Yes] Exposed Structures: [11:Fascia: No Tendon: No Muscle: No Joint: No Bone: No Small (1-33%)] [12:Fascia: No Tendon: No Muscle: No Joint: No Bone: No None] [4:Fascia: No Tendon: No Muscle: No Joint: No Bone: No Small (1-33%)] Epithelialization: [11:Compression Therapy] [12:Compression Therapy] [4:Compression Therapy] Treatment Notes Wound #11 (Lower Leg) Wound Laterality: Right, Lateral, Proximal Cleanser Soap and Water Discharge Instruction: May shower and wash wound with dial antibacterial soap and water prior to dressing change. Peri-Wound Care Triamcinolone 15 (g) Discharge Instruction: Mixed with lotion in clinic only. Use triamcinolone 15 (g) as directed Sween Lotion (Moisturizing lotion) Discharge Instruction: Apply moisturizing lotion as directed Topical Primary Dressing KerraCel Ag Gelling Fiber Dressing, 4x5 in (silver alginate) Discharge Instruction: Apply silver alginate to  wound bed as instructed Secondary Dressing Woven Gauze Sponge, Non-Sterile 4x4 in Discharge Instruction: Apply over primary dressing as directed. ABD Pad, 8x10 Discharge Instruction: Apply over primary dressing as directed. Secured With Compression Wrap FourPress (4 layer compression wrap) Discharge Instruction: Apply four layer compression  as directed. Compression Stockings Add-Ons Wound #12 (Lower Leg) Wound Laterality: Left, Anterior Cleanser Soap and Water Discharge Instruction: May shower and wash wound with dial antibacterial soap and water prior to dressing change. Peri-Wound Care Triamcinolone 15 (g) Discharge Instruction: Mixed with lotion in clinic only. Use triamcinolone 15 (g) as directed Sween Lotion (Moisturizing lotion) Discharge Instruction: Apply moisturizing lotion as directed Topical Primary Dressing KerraCel Ag Gelling Fiber Dressing, 4x5 in (silver alginate) Discharge Instruction: Apply silver alginate to wound bed as instructed Secondary Dressing Woven Gauze Sponge, Non-Sterile 4x4 in Discharge Instruction: Apply over primary dressing as directed. ABD Pad, 8x10 Discharge Instruction: Apply over primary dressing as directed. Secured With Compression Wrap FourPress (4 layer compression wrap) Discharge Instruction: Apply four layer compression as directed. Compression Stockings Add-Ons Wound #4 (Lower Leg) Wound Laterality: Right, Lateral, Distal Cleanser Soap and Water Discharge Instruction: May shower and wash wound with dial antibacterial soap and water prior to dressing change. Peri-Wound Care Triamcinolone 15 (g) Discharge Instruction: Mixed with lotion in clinic only. Use triamcinolone 15 (g) as directed Sween Lotion (Moisturizing lotion) Discharge Instruction: Apply moisturizing lotion as directed Topical Primary Dressing KerraCel Ag Gelling Fiber Dressing, 4x5 in (silver alginate) Discharge Instruction: Apply silver alginate to wound bed as instructed Secondary Dressing Woven Gauze Sponge, Non-Sterile 4x4 in Discharge Instruction: Apply over primary dressing as directed. ABD Pad, 8x10 Discharge Instruction: Apply over primary dressing as directed. Secured With Compression Wrap FourPress (4 layer compression wrap) Discharge Instruction: Apply four layer compression as  directed. Compression Stockings Add-Ons Electronic Signature(s) Signed: 09/25/2020 4:56:03 PM By: Linton Ham MD Signed: 09/25/2020 5:42:01 PM By: Deon Pilling Entered By: Linton Ham on 09/25/2020 09:30:07 -------------------------------------------------------------------------------- Multi-Disciplinary Care Plan Details Patient Name: Date of Service: [place] 09/25/2020 8:00 [place] Patton Number: [id number] Patient Account Number: [id number] Date of Birth/Sex: Treating RN: [birth year] (80 y.o. [name], [name] Primary Care Provider: [name]., [name] Other Clinician: Referring Provider: Treating Provider/Extender: [name] Leitz., [name] in Treatment: 28 Active Inactive Venous Leg Ulcer Nursing Diagnoses: Knowledge deficit related to disease process and management Potential for venous Insuffiency (use before diagnosis confirmed) Goals: Patient will maintain optimal edema control Date Initiated: 05/01/2020 Target Resolution Date: 10/31/2020 Goal Status: Active Interventions: Assess peripheral edema status every visit. Compression as ordered Provide education on venous insufficiency Notes: Electronic Signature(s) Signed: 09/25/2020 5:42:01 PM By: Deon Pilling Entered By: Deon Pilling on 09/25/2020 08:00:05 -------------------------------------------------------------------------------- Pain Assessment Details Patient Name: Date of Service: [name] 09/25/2020 8:00 [place] Patton Number: [id number] Patient Account Number: [id number] Date of Birth/Sex: Treating RN: 02-12-1941 (80 y.o. [name] Primary Care Provider: [name]., [name] Other Clinician: Referring Provider: Treating Provider/Extender: [name] Leitz., [name] in Treatment: 56 Active Problems Location of Pain Severity and Description of Pain Patient Has Paino No Site Locations Pain Management and Medication Current  Pain Management: Electronic Signature(s) Signed: 09/25/2020 3:17:01 PM By: Sandre Kitty Signed: 09/25/2020 5:42:01 PM By: Deon Pilling Entered By: Sandre Kitty on 09/25/2020 08:04:23 -------------------------------------------------------------------------------- Patient/Caregiver Education Details Patient Name: Date of Service: [place] 1/27/2022andnbsp8:00 [facility]ton  Number: [id number] Patient Account Number: [id number] Date of Birth/Gender: Treating RN: 02/02/1941 (80 y.o. [name] Primary Care Physician: [name]., [name] Other Clinician: Referring Physician: Treating Physician/Extender: [name] Leitz., [name] in Treatment: 44 Education Assessment Education Provided To: Patient Education Topics Provided Venous: Handouts: Managing Venous Disease and Related Ulcers Methods: Explain/Verbal Responses: Reinforcements needed Electronic Signature(s) Signed: 09/25/2020 5:42:01 PM By: Deon Pilling Entered By: Deon Pilling on 09/25/2020 08:00:16 -------------------------------------------------------------------------------- Wound Assessment Details Patient Name: Date of Service: [name] [facility] 09/25/2020 8:00 [place] Patton Number: [id number] Patient Account Number: [id number] Date of Birth/Sex: Treating RN: 02-11-1941 (80 y.o. [name], [name] Primary Care Provider: [name]., [name] Other Clinician: Referring Provider: Treating Provider/Extender: [name] Leitz., [name] in Treatment: 5 Wound Status Wound Number: 11 Primary Venous Leg Ulcer Etiology: Wound Location: Right, Proximal, Lateral Lower Leg Wound Open Wounding Event: Gradually Appeared Status: Date Acquired: 09/04/2020 Comorbid Anemia, Congestive Heart Failure, Hypertension, Peripheral Weeks Of Treatment: 3 History: Venous Disease, End Stage Renal Disease Clustered Wound: No Wound Measurements Length: (cm)  1.5 Width: (cm) 1.2 Depth: (cm) 0.1 Area: (cm) 1.414 Volume: (cm) 0.141 % Reduction in Area: -200.2% % Reduction in Volume: -200% Epithelialization: Small (1-33%) Tunneling: No Undermining: No Wound Description Classification: Full Thickness Without Exposed Support Structures Wound Margin: Flat and Intact Exudate Amount: Medium Exudate Type: Serous Exudate Color: amber Foul Odor After Cleansing: No Slough/Fibrino No Wound Bed Granulation Amount: Large (67-100%) Exposed Structure Granulation Quality: Pink, Pale Fascia Exposed: No Necrotic Amount: None Present (0%) Fat Layer (Subcutaneous Tissue) Exposed: Yes Tendon Exposed: No Muscle Exposed: No Joint Exposed: No Bone Exposed: No Treatment Notes Wound #11 (Lower Leg) Wound Laterality: Right, Lateral, Proximal Cleanser Soap and Water Discharge Instruction: May shower and wash wound with dial antibacterial soap and water prior to dressing change. Peri-Wound Care Triamcinolone 15 (g) Discharge Instruction: Mixed with lotion in clinic only. Use triamcinolone 15 (g) as directed Sween Lotion (Moisturizing lotion) Discharge Instruction: Apply moisturizing lotion as directed Topical Primary Dressing KerraCel Ag Gelling Fiber Dressing, 4x5 in (silver alginate) Discharge Instruction: Apply silver alginate to wound bed as instructed Secondary Dressing Woven Gauze Sponge, Non-Sterile 4x4 in Discharge Instruction: Apply over primary dressing as directed. ABD Pad, 8x10 Discharge Instruction: Apply over primary dressing as directed. Secured With Compression Wrap FourPress (4 layer compression wrap) Discharge Instruction: Apply four layer compression as directed. Compression Stockings Add-Ons Electronic Signature(s) Signed: 09/25/2020 5:42:01 PM By: Deon Pilling Signed: 09/25/2020 5:48:06 PM By: Levan Hurst RN, BSN Signed: 09/25/2020 5:48:06 PM By: Levan Hurst RN, BSN Entered By: Levan Hurst on 09/25/2020  08:21:24 -------------------------------------------------------------------------------- Wound Assessment Details Patient Name: Date of Service: [place] 09/25/2020 8:00 [place] Patton Number: [id number] Patient Account Number: [id number] Date of Birth/Sex: Treating RN: [birth year] (80 y.o. [name], [name] Primary Care Provider: [name]., [name] Other Clinician: Referring Provider: Treating Provider/Extender: [name] Leitz., [name] in Treatment: 50 Wound Status Wound Number: 12 Primary Venous Leg Ulcer Etiology: Wound Location: Left, Anterior Lower Leg Secondary Lymphedema Wounding Event: Gradually Appeared Etiology: Date Acquired: 09/25/2020 Wound Open Weeks Of Treatment: 0 Status: Clustered Wound: No Comorbid Anemia, Congestive Heart Failure, Hypertension, Peripheral History: Venous Disease, End Stage Renal Disease Wound Measurements Length: (cm) 1 Width: (cm) 1.5 Depth: (cm) 0.1 Area: (cm) 1.178 Volume: (cm) 0.118 % Reduction in Area: % Reduction in Volume: Epithelialization: None  Tunneling: No Undermining: No Wound Description Classification: Full Thickness Without Exposed Support Structures Wound Margin: Distinct, outline attached Exudate Amount: Medium Exudate Type: Serosanguineous Exudate Color: red, brown Foul Odor After Cleansing: No Slough/Fibrino No Wound Bed Granulation Amount: Large (67-100%) Exposed Structure Granulation Quality: Red Fascia Exposed: No Necrotic Amount: None Present (0%) Fat Layer (Subcutaneous Tissue) Exposed: Yes Tendon Exposed: No Muscle Exposed: No Joint Exposed: No Bone Exposed: No Treatment Notes Wound #12 (Lower Leg) Wound Laterality: Left, Anterior Cleanser Soap and Water Discharge Instruction: May shower and wash wound with dial antibacterial soap and water prior to dressing change. Peri-Wound Care Triamcinolone 15 (g) Discharge Instruction: Mixed with lotion in clinic  only. Use triamcinolone 15 (g) as directed Sween Lotion (Moisturizing lotion) Discharge Instruction: Apply moisturizing lotion as directed Topical Primary Dressing KerraCel Ag Gelling Fiber Dressing, 4x5 in (silver alginate) Discharge Instruction: Apply silver alginate to wound bed as instructed Secondary Dressing Woven Gauze Sponge, Non-Sterile 4x4 in Discharge Instruction: Apply over primary dressing as directed. ABD Pad, 8x10 Discharge Instruction: Apply over primary dressing as directed. Secured With Compression Wrap FourPress (4 layer compression wrap) Discharge Instruction: Apply four layer compression as directed. Compression Stockings Add-Ons Electronic Signature(s) Signed: 09/25/2020 5:42:01 PM By: Deon Pilling Entered By: Deon Pilling on 09/25/2020 08:39:07 -------------------------------------------------------------------------------- Wound Assessment Details Patient Name: Date of Service: [name] 09/25/2020 8:00 [place] Patton Number: [id number] Patient Account Number: [id number] Date of Birth/Sex: Treating RN: 1941-02-07 (80 y.o. [name], [name] Primary Care Provider: [name]., [name] Other Clinician: Referring Provider: Treating Provider/Extender: [name] Leitz., [name] in Treatment: 15 Wound Status Wound Number: 4 Primary Venous Leg Ulcer Etiology: Wound Location: Right, Distal, Lateral Lower Leg Wound Open Wounding Event: Blister Status: Date Acquired: 09/11/2019 Comorbid Anemia, Congestive Heart Failure, Hypertension, Peripheral Weeks Of Treatment: 50 History: Venous Disease, End Stage Renal Disease Clustered Wound: No Wound Measurements Length: (cm) 1.4 Width: (cm) 1.5 Depth: (cm) 0.1 Area: (cm) 1.649 Volume: (cm) 0.165 % Reduction in Area: 82.3% % Reduction in Volume: 82.3% Epithelialization: Small (1-33%) Tunneling: No Undermining: No Wound Description Classification: Full Thickness Without  Exposed Support Structures Wound Margin: Flat and Intact Exudate Amount: Medium Exudate Type: Serous Exudate Color: amber Foul Odor After Cleansing: No Slough/Fibrino No Wound Bed Granulation Amount: Large (67-100%) Exposed Structure Granulation Quality: Pink, Pale Fascia Exposed: No Necrotic Amount: None Present (0%) Fat Layer (Subcutaneous Tissue) Exposed: Yes Tendon Exposed: No Muscle Exposed: No Joint Exposed: No Bone Exposed: No Treatment Notes Wound #4 (Lower Leg) Wound Laterality: Right, Lateral, Distal Cleanser Soap and Water Discharge Instruction: May shower and wash wound with dial antibacterial soap and water prior to dressing change. Peri-Wound Care Triamcinolone 15 (g) Discharge Instruction: Mixed with lotion in clinic only. Use triamcinolone 15 (g) as directed Sween Lotion (Moisturizing lotion) Discharge Instruction: Apply moisturizing lotion as directed Topical Primary Dressing KerraCel Ag Gelling Fiber Dressing, 4x5 in (silver alginate) Discharge Instruction: Apply silver alginate to wound bed as instructed Secondary Dressing Woven Gauze Sponge, Non-Sterile 4x4 in Discharge Instruction: Apply over primary dressing as directed. ABD Pad, 8x10 Discharge Instruction: Apply over primary dressing as directed. Secured With Compression Wrap FourPress (4 layer compression wrap) Discharge Instruction: Apply four layer compression as directed. Compression Stockings Add-Ons Electronic Signature(s) Signed: 09/25/2020 5:42:01 PM By: Deon Pilling Signed: 09/25/2020 5:48:06 PM By: Levan Hurst RN, BSN Entered By: Levan Hurst on 09/25/2020 08:21:42 -------------------------------------------------------------------------------- Vitals Details Patient Name: Date of Service: [place]  [facility] 09/25/2020 8:00 [facility] Number: [id number] Patient Account Number: [id number] Date of Birth/Sex: Treating RN: [birth year] (80 y.o. [name], [name] Primary Care  Provider: [name]., [name] Other Clinician: Referring Provider: Treating Provider/Extender: [name] Leitz., [name] in Treatment: 50 Vital Signs Time Taken: 08:03 Temperature (F): 98.1 Height (in): 71 Pulse (bpm): 93 Weight (lbs): 198 Respiratory Rate (breaths/min): 20 Body Mass Index (BMI): 27.6 Blood Pressure (mmHg): 119/77 Reference Range: 80 - 120 mg / dl Electronic Signature(s) Signed: 09/25/2020 3:17:01 PM By: Sandre Kitty Entered By: Sandre Kitty on 09/25/2020 08:04:17

## 2020-09-26 ENCOUNTER — Other Ambulatory Visit (HOSPITAL_COMMUNITY)
Admission: RE | Admit: 2020-09-26 | Discharge: 2020-09-26 | Disposition: A | Discharge status: Home / Self Care | Payer: Medicare Other | Source: Ambulatory Visit | Attending: Internal Medicine | Admitting: Internal Medicine

## 2020-09-26 DIAGNOSIS — I509 Heart failure, unspecified: Secondary | ICD-10-CM | POA: Insufficient documentation

## 2020-09-26 DIAGNOSIS — I87323 Chronic venous hypertension (idiopathic) with inflammation of bilateral lower extremity: Secondary | ICD-10-CM | POA: Diagnosis present

## 2020-09-26 LAB — CBC WITH DIFFERENTIAL/PLATELET
Abs Immature Granulocytes: 0.02 10*3/uL (ref 0.00–0.07)
Basophils Absolute: 0 10*3/uL (ref 0.0–0.1)
Basophils Relative: 0 %
Eosinophils Absolute: 0.1 10*3/uL (ref 0.0–0.5)
Eosinophils Relative: 2 %
HCT: 27.1 % — ABNORMAL LOW (ref 39.0–52.0)
Hemoglobin: 8.3 g/dL — ABNORMAL LOW (ref 13.0–17.0)
Immature Granulocytes: 1 %
Lymphocytes Relative: 7 %
Lymphs Abs: 0.2 10*3/uL — ABNORMAL LOW (ref 0.7–4.0)
MCH: 27.7 pg (ref 26.0–34.0)
MCHC: 30.6 g/dL (ref 30.0–36.0)
MCV: 90.3 fL (ref 80.0–100.0)
Monocytes Absolute: 0.2 10*3/uL (ref 0.1–1.0)
Monocytes Relative: 5 %
Neutro Abs: 2.8 10*3/uL (ref 1.7–7.7)
Neutrophils Relative %: 85 %
Platelets: 238 10*3/uL (ref 150–400)
RBC: 3 MIL/uL — ABNORMAL LOW (ref 4.22–5.81)
RDW: 17.5 % — ABNORMAL HIGH (ref 11.5–15.5)
WBC: 3.3 10*3/uL — ABNORMAL LOW (ref 4.0–10.5)
nRBC: 0 % (ref 0.0–0.2)

## 2020-09-26 LAB — COMPREHENSIVE METABOLIC PANEL
ALT: 9 U/L (ref 0–44)
AST: 19 U/L (ref 15–41)
Albumin: 3.1 g/dL — ABNORMAL LOW (ref 3.5–5.0)
Alkaline Phosphatase: 59 U/L (ref 38–126)
Anion gap: 14 (ref 5–15)
BUN: 67 mg/dL — ABNORMAL HIGH (ref 8–23)
CO2: 23 mmol/L (ref 22–32)
Calcium: 8.6 mg/dL — ABNORMAL LOW (ref 8.9–10.3)
Chloride: 101 mmol/L (ref 98–111)
Creatinine, Ser: 2.49 mg/dL — ABNORMAL HIGH (ref 0.61–1.24)
GFR, Estimated: 26 mL/min — ABNORMAL LOW (ref >60)
Glucose, Bld: 95 mg/dL (ref 70–99)
Potassium: 3.9 mmol/L (ref 3.5–5.1)
Sodium: 138 mmol/L (ref 135–145)
Total Bilirubin: 2.1 mg/dL — ABNORMAL HIGH (ref 0.3–1.2)
Total Protein: 8.4 g/dL — ABNORMAL HIGH (ref 6.5–8.1)

## 2020-09-26 LAB — BRAIN NATRIURETIC PEPTIDE: B Natriuretic Peptide: 2676.7 pg/mL — ABNORMAL HIGH (ref 0.0–100.0)

## 2020-09-29 ENCOUNTER — Other Ambulatory Visit: Payer: Self-pay

## 2020-09-29 ENCOUNTER — Ambulatory Visit (HOSPITAL_COMMUNITY)
Admission: RE | Admit: 2020-09-29 | Discharge: 2020-09-29 | Disposition: A | Discharge status: Home / Self Care | Payer: Medicare Other | Source: Ambulatory Visit | Attending: Internal Medicine | Admitting: Internal Medicine

## 2020-09-29 ENCOUNTER — Other Ambulatory Visit (HOSPITAL_COMMUNITY): Payer: Self-pay | Admitting: Internal Medicine

## 2020-09-29 DIAGNOSIS — R188 Other ascites: Secondary | ICD-10-CM

## 2020-10-02 ENCOUNTER — Other Ambulatory Visit: Payer: Self-pay

## 2020-10-02 ENCOUNTER — Encounter (HOSPITAL_BASED_OUTPATIENT_CLINIC_OR_DEPARTMENT_OTHER): Payer: Medicare Other | Attending: Internal Medicine | Admitting: Internal Medicine

## 2020-10-02 DIAGNOSIS — S90812A Abrasion, left foot, initial encounter: Secondary | ICD-10-CM | POA: Insufficient documentation

## 2020-10-02 DIAGNOSIS — E1122 Type 2 diabetes mellitus with diabetic chronic kidney disease: Secondary | ICD-10-CM | POA: Diagnosis not present

## 2020-10-02 DIAGNOSIS — Z7901 Long term (current) use of anticoagulants: Secondary | ICD-10-CM | POA: Diagnosis not present

## 2020-10-02 DIAGNOSIS — L97822 Non-pressure chronic ulcer of other part of left lower leg with fat layer exposed: Secondary | ICD-10-CM | POA: Insufficient documentation

## 2020-10-02 DIAGNOSIS — X58XXXA Exposure to other specified factors, initial encounter: Secondary | ICD-10-CM | POA: Insufficient documentation

## 2020-10-02 DIAGNOSIS — I87323 Chronic venous hypertension (idiopathic) with inflammation of bilateral lower extremity: Secondary | ICD-10-CM | POA: Insufficient documentation

## 2020-10-02 DIAGNOSIS — I5022 Chronic systolic (congestive) heart failure: Secondary | ICD-10-CM | POA: Insufficient documentation

## 2020-10-02 DIAGNOSIS — L97812 Non-pressure chronic ulcer of other part of right lower leg with fat layer exposed: Secondary | ICD-10-CM | POA: Insufficient documentation

## 2020-10-02 DIAGNOSIS — E1151 Type 2 diabetes mellitus with diabetic peripheral angiopathy without gangrene: Secondary | ICD-10-CM | POA: Insufficient documentation

## 2020-10-02 DIAGNOSIS — N186 End stage renal disease: Secondary | ICD-10-CM | POA: Insufficient documentation

## 2020-10-02 DIAGNOSIS — I890 Lymphedema, not elsewhere classified: Secondary | ICD-10-CM | POA: Diagnosis not present

## 2020-10-02 DIAGNOSIS — I132 Hypertensive heart and chronic kidney disease with heart failure and with stage 5 chronic kidney disease, or end stage renal disease: Secondary | ICD-10-CM | POA: Diagnosis not present

## 2020-10-02 DIAGNOSIS — E11622 Type 2 diabetes mellitus with other skin ulcer: Secondary | ICD-10-CM | POA: Insufficient documentation

## 2020-10-02 NOTE — Progress Notes (Signed)
[name], [name] ([id number]) Visit Report for 10/02/2020 HPI Details Patient Name: Date of Service: [place] 10/02/2020 7:30 A M Medical Record Number: [id number] Patient Account Number: [id number] Date of Birth/Sex: Treating RN: 02-11-1941 (79 y.o. [name], [name] Primary Care Provider: [name]., [name] Other Clinician: Referring Provider: Treating Provider/Extender: [name], [name] in Treatment: 36 History of Present Illness HPI Description: ADMISSION 10/11/2019 This is a 80 year old man with a history of a severe cardiomyopathy with an ejection fraction of about 20%, chronic renal failure stage III. He is listed as a type II diabetic in epic although the patient denies this. He also has a history of PVD. He states for the last month he has had wounds on his bilateral lower extremities that started off as blisters which denuded. He has areas on the left lateral calf and 2 on the right lateral. He has an area on the left first met head which he did not know was there he we identified this on intake. He has been using Silvadene cream provided by his primary care physician but he is complaining that this burns. 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 He was acutely ill at that time complicating GI bleed. ABIs in our clinic were 1.16 on the right and 1.13 on the left 2/25; the patient comes in with his areas on the left lateral and right lateral calf. There is also an area over the left first MTP bunion deformity. We have been using Sorbact. His edema control is fairly good 3/4; left lateral and  right lateral calf. Most of his wounds are in the same position tightly adherent nonviable debris. On the right we debrided the superior wound on the left both wounds. The area on his bunion over the left first MTP medially is I think just about closed. We have been using Sorbact without a lot of success changed to Iodoflex under compression 3/11; left lateral and right lateral calf perhaps minor improvement in the surface condition. We have been using Iodoflex. The area over the bunion of the left first MTP has closed over 3/18; left lateral and right lateral calf not much improvement. We have been using Iodoflex. Aggressive debridement last week. 3/25; left lateral and right lateral calf. We have been using Iodoflex. There is some improvement in the superior area on the right and 2 on the lateral left although there is still a lot of debris on the surface. The inferior area on the right is still a completely nonviable surface. 4/8; left lateral and right lateral calfs. Essentially mirror-image looking wounds 2 wounds on each side in close juxtaposition we have been using Iodoflex with some improvement in the very adherent fibrinous debris but not a lot. The patient has arterial studies next Wednesday morning and venous studies next Thursday morning. I have been avoiding any further aggressive debridement until we see the arterial study results. His ABIs were fairly good in this clinic and is dorsalis pedis pulses are palpable but the wound beds are pale. 4/15; left lateral and right lateral calfs. Essentially mirror-image wounds with 2 wounds on each side in close juxtaposition  but with rims of separating normal tissue. We have been using Iodoflex. The base of the wounds has been cleaning up quite nicely ARTERIAL STUDIES were done showing the patient had an ABI on the right of 1.27 with triphasic waveforms and a TBI of 0.90 on the left triphasic waveforms with an ABI of 1.28 and a TBI of 0.87. No  evidence of arterial disease VENOUS REFLUX STUDIES; were done yesterday we do not have this report yet. 4/23; VENOUS REFLUX STUDIES did not show any significant reflux right lower extremity. No evidence of a DVT He did have significant reflux in the common . femoral vein on the left but nothing else was listed as significant. He did not have a DVT. We have been using Iodoflex under compression his wounds are making progress. 4/29; improvements in the wound surface continue. We have been using Iodoflex. He has a 20% out-of-pocket co-pay for Apligraf which is unfortunate. We changed him to Sorbact today 5/6; started on Sorbact last week. Better looking wound surface but not much change in dimensions. 01/24/20-Patient is back at 3 weeks, wound surfaces are about the same but very minimal slough on the right leg wounds, however there is blistering on both legs adjacent to the wounds, patient denies any other symptoms or new symptoms. His studies have been reviewed and do not indicate any significant arterial or venous disease. But he is attending once in 3 weeks with home health changing in between 6/3; patient has 4 wounds 2 on each side of his lateral lower legs. These wounds are somewhat improved. He apparently arrived in clinic last week with a large blister on the right lateral lower leg that had denuded into the wound. They changed to silver alginate. Still under compression. He has straight Medicare and unfortunately has an unlimited co-pay for advanced treatment options. 6/10; his original 4 wounds are all better especially on the left lateral lower leg. 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 6/24; blister on the right lateral lower leg has healed. There is no additional wounds. The traumatic left second toe is also closed. The 4 that he has are all better 7/1; all of the patient's wounds are smaller except for the proximal one on the left lateral calf. We are using silver collagen 7/22; patient has home health. Relatively refractory wounds on the lateral part of both mid calfs. Each of them 2 wounds. The areas on the left are doing much better in fact the distal 1 looks like it is on his way to closure. The right required debridement with adherent fibrinous debris under illumination. We have been using silver collagen. Previous arterial studies were within normal limits he also had venous reflux studies 04/03/2020 upon evaluation today patient appears to be doing excellent with regard to his lower extremity ulcers. He is going to require a little bit of debridement on the wounds on the right especially in order to clear away some biofilm and slough but this is minimal and overall he seems to be doing quite well. 8/19; the patient's wounds on the left lateral lower leg just above closed. However the 2  on the lateral part of the right have a nonviable necrotic surface. This is disappointing. There is also no change in dimensions. He has had arterial studies as well as venous reflux studies 8/26; very disappointing today. Even the more superficial areas on the left are about the same as last week. Still 2 punched-out areas completely unchanged on the right. We have been using silver collagen really making no progress. 9/2; changed to Iodoflex last week better looking wound surfaces especially on the right although they are deep and punched out. The areas on the left are more superficial open 1 of these is almost fully epithelialized although it has been this way for at least 2 weeks. He has a 20% co-pay [Medicare only] unaffordable for a skin  substitute. Had some thought about a snap VAC on the deep areas on the right leg we will try to put this through in the insurance 9/9; punched-out wounds on the bilateral lateral lower extremities. We have been operating as if the these were chronic venous wounds with secondary lymphedema. The areas on the left have been doing well in fact one of them is closed over. We have had no improvement in the areas on the right we have been using Iodoflex to help with surface debridement. The areas on the right are deeper wider. I do not see any evidence of infection. Home health is not been putting the wraps on high enough he has localized lymphedema right above the wounds. He has had arterial and venous studies already 9/16; punched-out mirror-image wounds on his bilateral lateral mid calfs. We have 1 closed on the left lateral the other smaller. For the first time today the areas on the right lateral actually looks some better. I did a biopsy of 1 of these last time although we still do not have this result. HOWEVER he arrives in clinic today complaining of chest pain overnight which felt like a brick on his chest. He also had 2 black watery bowel movements. He does not have nausea or vomiting. He is not describing abdominal pain. He has no prior history of diarrhea heartburn etc. 9/30; sent this patient to the ER on 9/16. 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 He is on twice daily PPIs Protonix. His admission hemoglobin was exceptionally low at 5. 8.. Discharged at 8.1. 4 unit transfusion. 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 His diuretic dose was actually decreased because of elevated creatinine currently at torsemide 20 mg not felt to be a Coumadin candidate. Patient states he feels well. The areas on his left leg are healed. The areas on the right look better. These have been very refractory  wounds. He does not have stockings we will make arrangements for this today 10/7; the areas on the left leg are healed we will transition him into his 20/30 stocking today. Continued improvement in the areas on the right lateral leg 10/21; the areas on the left leg remain healed at this 2-week follow-up however there is a history that he developed some drainage from one of the wound sites with home health therefore going ahead and wrapping him. He was supposed to be on 20/30 stockings although the history here is vague and I am not sure that this represents a stocking failure or not. We have been wrapping his right leg with the one remaining wound is the distal wound. We  have been using silver collagen 10/28 the patient only has 1 wound remaining on the right lower calf. This still has 3 mm of depth which is unchanged but the surface area is smaller. The superior wound on the right lateral is closed. The 2 wounds on the left remain closed and he is using his compression stocking 11/4 1 remaining wound on the right lower calf. Under illumination not a viable surface we have been using silver collagen we had good effect on the other areas but this 1 appears to be stalled. There is no open area on the left leg he is using his own stocking. 11/11; 1 remaining wound on the right lower calf. Surface of this is a lot better than last week but still requiring debridement I am using Iodoflex but looking forward to changing the primary dressing to perhaps endoform 11/18; 1 remaining wound on the right lower leg, venous insufficiency, vigorous debridement last week/Iodoflex. Depth of the wound is therefore larger but the wound looks clean. Still very gritty material at the surface requiring debridement 12/2; the remaining wound on his right lower leg, venous insufficiency. I changed him to endoform 2 weeks ago. We appear to be making nice progress. Wound is measuring smaller 12/16; he comes in today with one of  his two wounds on the left opened infected second area on the left may be threatened as well as the healed one on the right. He has edema in both legs which I think is pitting. He says his weights are stable he has unknown fairly severe cardiomyopathy. His cardiologist is Dr. Brigitte Pulse it does not sound like he is really been carefully followed 12/23; the areas that I was concerned about on the left look a lot better we put some silver alginate and put him back in compression. It may be the compression that actually does the trick here. The remaining open area we have been using Iodoflex and again this looks a lot better. At our suggestion he went to see his primary who did not adjust his diuretic he is still apparently taking 20 mg of a loop diuretic and "sometimes" 40 mg is basically what the patient stated 1/6; the area on the left is closed once again. We will transition him into his 20/30 stocking from elastic therapy both areas on the right are now open which is a deterioration from last time at which time the only 1 was open but they are very superficial. We have been using Hydrofera Blue under compression 1/27; its been 3 weeks since the patient was here. We transitioned him to a 20/30 stocking on the left leg. Still under 4-layer compression on the right. He comes in today with a new small opening on the left leg but reopening on the right. Massive blistering on the right leg. He has severe systemic fluid volume overload. I have looked over the patient's records. He was seen in 2020 by tele health by cardiology. 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 I think he was supposed to have follow-up but that follow-up never seems to have happened. He does not complain of chest pain or shortness of breath but notes increasing edema not just in his lower legs 2/3; he has new open areas on the bilateral anterior lower legs. I think a lot of this is  because of systemic fluid volume overload. The ultrasound I ordered showed ascites and suggestion of cirrhosis. His physical exam  continues to suggest right greater than left congestive heart failure. I have him taking 40 mg of Lasix oral daily I have asked him to start weighing himself daily. With the intense efforts of our nursing staff we have him a cardiology appointment on Monday or Tuesday of next week I have told him not to miss this. With regards to his wounds I do not think we are going to be able to stop wrapping him until somebody manages to get his fluid volume down Electronic Signature(s) Signed: 10/02/2020 4:57:15 PM By: Linton Ham MD Entered By: Linton Ham on 10/02/2020 08:42:14 -------------------------------------------------------------------------------- Physical Exam Details Patient Name: Date of Service: [place], [place] 10/02/2020 7:30 [place] Record Number: [id number] Patient Account Number: [id number] Date of Birth/Sex: Treating RN: 08-06-1941 (80 y.o. [name], [name] Primary Care Provider: [name]., [name] Other Clinician: Referring Provider: Treating Provider/Extender: [name], [name] in Treatment: 65 Constitutional Sitting or standing Blood Pressure is within target range for patient.. Pulse regular and within target range for patient.Marland Kitchen Respirations regular, non-labored and within target range.. Temperature is normal and within the target range for the patient.Marland Kitchen Appears in no distress. Respiratory work of breathing is normal. Crackles bilaterally at the bases. Cardiovascular I suspect he has mitral and tricuspid regurgitant murmurs. JVP is elevated to the angle of the jaw at about 80 degrees tall V wave. Edema present in both extremities. Pitting edema extending well up into his thighs. Gastrointestinal (GI) Massive abdominal distention. Notes Wound exam; his wounds do not look too bad although he has had further  breakdown on the anterior part of both mid tibia area as well as the areas laterally. No evidence of infection. Where we are wrapping he has decent edema control Electronic Signature(s) Signed: 10/02/2020 4:57:15 PM By: Linton Ham MD Entered By: Linton Ham on 10/02/2020 08:44:16 -------------------------------------------------------------------------------- Physician Orders Details Patient Name: Date of Service: [place] 10/02/2020 7:30 A M Medical Record Number: [id number] Patient Account Number: [id number] Date of Birth/Sex: Treating RN: 03/13/1941 (80 y.o. 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 Bathing/ Shower/ Hygiene May shower with protection but do not get wound dressing(s) wet. May shower and wash wound with soap and water. - with dressing changes only. Edema Control - Lymphedema / SCD / Other Elevate legs to the level of the heart or above for 30 minutes daily and/or when sitting, a frequency of: - 3-4 times a day throughout the day. Avoid standing for long periods of time. Exercise regularly Additional Orders / Instructions Other: - patient to weigh daily, write it down, and take to cardiology. Patient to call [facility] on [facility]. today to  reschedule cardiology appt for Monday or Tuesday of next week-10/06/2020 or 10/07/2020. Home Health New wound care orders this week; continue Home Health for wound care. May utilize formulary equivalent dressing for wound treatment  orders unless otherwise specified. - [facility] home [facility]. Wound Treatment Wound #11 - Lower Leg Wound Laterality: Right, Lateral, Proximal Cleanser: Soap and Water (Home Health) 2 x Per Week/30 Days Discharge Instructions: May shower and wash wound with dial antibacterial soap and water prior to dressing change. Peri-Wound Care: Triamcinolone 15 (g) 2 x Per Week/30 Days Discharge Instructions: Mixed with lotion in clinic only. Use triamcinolone 15 (g) as directed Peri-Wound Care: Sween Lotion (Moisturizing lotion) (Home Health) 2 x Per Week/30 Days Discharge Instructions: Apply moisturizing lotion as directed Prim Dressing: KerraCel Ag Gelling Fiber Dressing, 4x5 in (silver alginate) (Home Health) 2 x Per Week/30 Days ary Discharge Instructions: Apply silver alginate to wound bed as instructed Secondary Dressing: Woven Gauze Sponge, Non-Sterile 4x4 in (Home Health) 2 x Per Week/30 Days Discharge Instructions: Apply over primary dressing as directed. Secondary Dressing: ABD Pad, 8x10 (Home Health) 2 x Per Week/30 Days Discharge Instructions: Apply over primary dressing as directed. Compression Wrap: FourPress (4 layer compression wrap) (Home Health) 2 x Per Week/30 Days Discharge Instructions: Apply four layer compression as directed. Wound #12 - Lower Leg Wound Laterality: Left, Anterior Cleanser: Soap and Water (Home Health) 2 x Per Week/30 Days Discharge Instructions: May shower and wash wound with dial antibacterial soap and water prior to dressing change. Peri-Wound Care: Triamcinolone 15 (g) 2 x Per Week/30 Days Discharge Instructions: Mixed with lotion in clinic only. Use triamcinolone 15 (g) as directed Peri-Wound Care: Sween Lotion (Moisturizing lotion)  (Home Health) 2 x Per Week/30 Days Discharge Instructions: Apply moisturizing lotion as directed Prim Dressing: KerraCel Ag Gelling Fiber Dressing, 4x5 in (silver alginate) (Home Health) 2 x Per Week/30 Days ary Discharge Instructions: Apply silver alginate to wound bed as instructed Secondary Dressing: Woven Gauze Sponge, Non-Sterile 4x4 in (Home Health) 2 x Per Week/30 Days Discharge Instructions: Apply over primary dressing as directed. Secondary Dressing: ABD Pad, 8x10 (Home Health) 2 x Per Week/30 Days Discharge Instructions: Apply over primary dressing as directed. Compression Wrap: FourPress (4 layer compression wrap) (Home Health) 2 x Per Week/30 Days Discharge Instructions: Apply four layer compression as directed. Wound #13 - Lower Leg Wound Laterality: Right, Anterior Cleanser: Soap and Water (Home Health) 2 x Per Week/30 Days Discharge Instructions: May shower and wash wound with dial antibacterial soap and water prior to dressing change. Peri-Wound Care: Triamcinolone 15 (g) 2 x Per Week/30 Days Discharge Instructions: Mixed with lotion in clinic only. Use triamcinolone 15 (g) as directed Peri-Wound Care: Sween Lotion (Moisturizing lotion) (Home Health) 2 x Per Week/30 Days Discharge Instructions: Apply moisturizing lotion as directed Prim Dressing: KerraCel Ag Gelling Fiber Dressing, 4x5 in (silver alginate) (Home Health) 2 x Per Week/30 Days ary Discharge Instructions: Apply silver alginate to wound bed as instructed Secondary Dressing: Woven Gauze Sponge, Non-Sterile 4x4 in (Home Health) 2 x Per Week/30 Days Discharge Instructions: Apply over primary dressing as directed. Secondary Dressing: ABD Pad, 8x10 (Home Health) 2 x Per Week/30 Days Discharge Instructions: Apply over primary dressing as directed. Compression Wrap: FourPress (4 layer compression wrap) (Home Health) 2 x Per Week/30 Days Discharge Instructions: Apply four layer compression as directed. Wound #14 - Lower  Leg Wound Laterality: Left, Lateral Cleanser: Soap and Water (Home Health) 2 x Per Week/30 Days Discharge Instructions: May shower and wash wound with dial antibacterial soap and water prior to dressing change. Peri-Wound Care: Triamcinolone 15 (g) 2 x Per Week/30 Days Discharge Instructions: Mixed with lotion in clinic only. Use triamcinolone 15 (g) as directed Peri-Wound  Care: Sween Lotion (Moisturizing lotion) (Home Health) 2 x Per Week/30 Days Discharge Instructions: Apply moisturizing lotion as directed Prim Dressing: KerraCel Ag Gelling Fiber Dressing, 4x5 in (silver alginate) (Home Health) 2 x Per Week/30 Days ary Discharge Instructions: Apply silver alginate to wound bed as instructed Secondary Dressing: Woven Gauze Sponge, Non-Sterile 4x4 in (Home Health) 2 x Per Week/30 Days Discharge Instructions: Apply over primary dressing as directed. Secondary Dressing: ABD Pad, 8x10 (Home Health) 2 x Per Week/30 Days Discharge Instructions: Apply over primary dressing as directed. Compression Wrap: FourPress (4 layer compression wrap) (Home Health) 2 x Per Week/30 Days Discharge Instructions: Apply four layer compression as directed. Wound #4 - Lower Leg Wound Laterality: Right, Lateral, Distal Cleanser: Soap and Water (Home Health) 2 x Per Week/30 Days Discharge Instructions: May shower and wash wound with dial antibacterial soap and water prior to dressing change. Peri-Wound Care: Triamcinolone 15 (g) 2 x Per Week/30 Days Discharge Instructions: Mixed with lotion in clinic only. Use triamcinolone 15 (g) as directed Peri-Wound Care: Sween Lotion (Moisturizing lotion) (Home Health) 2 x Per Week/30 Days Discharge Instructions: Apply moisturizing lotion as directed Prim Dressing: KerraCel Ag Gelling Fiber Dressing, 4x5 in (silver alginate) (Home Health) 2 x Per Week/30 Days ary Discharge Instructions: Apply silver alginate to wound bed as instructed Secondary Dressing: Woven Gauze Sponge,  Non-Sterile 4x4 in (Home Health) 2 x Per Week/30 Days Discharge Instructions: Apply over primary dressing as directed. Secondary Dressing: ABD Pad, 8x10 (Home Health) 2 x Per Week/30 Days Discharge Instructions: Apply over primary dressing as directed. Compression Wrap: FourPress (4 layer compression wrap) (Home Health) 2 x Per Week/30 Days Discharge Instructions: Apply four layer compression as directed. Electronic Signature(s) Signed: 10/02/2020 4:57:15 PM By: Baltazar Najjar MD Signed: 10/02/2020 5:30:16 PM By: Shawn Stall Entered By: Shawn Stall on 10/02/2020 08:37:33 -------------------------------------------------------------------------------- Problem List Details Patient Name: Date of Service: [place] [facility] [facility] 10[facility]02/2020 7:30 A M Medical Record Number: [id number] Patient Account Number: [id number] Date of Birth/Sex: Treating RN: 1941-06-19 (80 y.o. 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/06/2020 No Yes L97.812 Non-pressure chronic ulcer of other part of right lower leg with fat layer 01/03/2020 No Yes exposed I50.22 Chronic systolic (congestive) heart failure 10/02/2020 No Yes Inactive Problems ICD-10 Code Description Active Date Inactive Date L97.521 Non-pressure chronic ulcer of other part of left foot limited to breakdown of skin 10/11/2019 10/11/2019 K92.1 Melena 05/15/2020 05/15/2020 S90.812D Abrasion, left foot, subsequent encounter 02/07/2020 02/07/2020 I42.9 Cardiomyopathy, unspecified  05/15/2020 05/15/2020 Resolved Problems ICD-10 Code Description Active Date Resolved Date 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 10/11/2019 10/11/2019 Electronic Signature(s) Signed: 10/02/2020 4:57:15 PM By: Baltazar Najjar MD Entered By: Baltazar Najjar on 10/02/2020 08:40:39 -------------------------------------------------------------------------------- Progress Note Details Patient Name: Date of Service: [place] RRA [facility] 10/02/2020 7:30 A M Medical Record Number: [id number] Patient Account Number: [id number] Date of Birth/Sex: Treating RN: 31-Oct-1940 (80 y.o. Harlon Flor, Millard.Loa Primary Care Provider: Marcelline Mates., [name] Other Clinician: Referring Provider: Treating Provider/Extender: [name] Blase., [name] in Treatment: 51 Subjective  History of Present Illness (HPI) ADMISSION 10/11/2019 This is a 80 year old man with a history of a severe cardiomyopathy with an ejection fraction of about 20%, chronic renal failure stage III. He is listed as a type II diabetic in epic although the patient denies this. He also has a history of PVD. He states for the last month he has had wounds on his bilateral lower extremities that started off as blisters which denuded. He has areas on the left lateral calf and 2 on the right lateral. He has an area on the left first met head which he did not know was there he we identified this on intake. He has been using Silvadene cream provided by his primary care physician but he is complaining that this burns. 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 He was  acutely ill at that time complicating GI bleed. ABIs in our clinic were 1.16 on the right and 1.13 on the left 2/25; the patient comes in with his areas on the left lateral and right lateral calf. There is also an area over the left first MTP bunion deformity. We have been using Sorbact. His edema control is fairly good 3/4; left lateral and right lateral calf. Most of his wounds are in the same position tightly adherent nonviable debris. On the right we debrided the superior wound on the left both wounds. The area on his bunion over the left first MTP medially is I think just about closed. We have been using Sorbact without a lot of success changed to Iodoflex under compression 3/11; left lateral and right lateral calf perhaps minor improvement in the surface condition. We have been using Iodoflex. The area over the bunion of the left first MTP has closed over 3/18; left lateral and right lateral calf not much improvement. We have been using Iodoflex. Aggressive debridement last week. 3/25; left lateral and right lateral calf. We have been using Iodoflex. There is some improvement in the superior area on the right and 2 on the lateral left although there is still a lot of debris on the surface. The inferior area on the right is still a completely nonviable surface. 4/8; left lateral and right lateral calfs. Essentially mirror-image looking wounds 2 wounds on each side in close juxtaposition we have been using Iodoflex with some improvement in the very adherent fibrinous debris but not a lot. The patient has arterial studies next Wednesday morning and venous studies next Thursday morning. I have been avoiding any further aggressive debridement until we see the arterial study results. His ABIs were fairly good in this clinic and is dorsalis pedis pulses are palpable but the wound beds are pale. 4/15; left lateral and right lateral calfs. Essentially mirror-image wounds with 2 wounds on each side in  close juxtaposition but with rims of separating normal tissue. We have been using Iodoflex. The base of the wounds has been cleaning up quite nicely ARTERIAL STUDIES were done showing the patient had an ABI on the right of 1.27 with triphasic waveforms and a TBI of 0.90 on the left triphasic waveforms with an ABI of 1.28 and a TBI of 0.87. No evidence of arterial disease VENOUS REFLUX  STUDIES; were done yesterday we do not have this report yet. 4/23; VENOUS REFLUX STUDIES did not show any significant reflux right lower extremity. No evidence of a DVT He did have significant reflux in the common . femoral vein on the left but nothing else was listed as significant. He did not have a DVT. We have been using Iodoflex under compression his wounds are making progress. 4/29; improvements in the wound surface continue. We have been using Iodoflex. He has a 20% out-of-pocket co-pay for Apligraf which is unfortunate. We changed him to Sorbact today 5/6; started on Sorbact last week. Better looking wound surface but not much change in dimensions. 01/24/20-Patient is back at 3 weeks, wound surfaces are about the same but very minimal slough on the right leg wounds, however there is blistering on both legs adjacent to the wounds, patient denies any other symptoms or new symptoms. His studies have been reviewed and do not indicate any significant arterial or venous disease. But he is attending once in 3 weeks with home health changing in between 6/3; patient has 4 wounds 2 on each side of his lateral lower legs. These wounds are somewhat improved. He apparently arrived in clinic last week with a large blister on the right lateral lower leg that had denuded into the wound. They changed to silver alginate. Still under compression. He has straight Medicare and unfortunately has an unlimited co-pay for advanced treatment options. 6/10; his original 4 wounds are all better especially on the left lateral lower leg.  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 6/24; blister on the right lateral lower leg has healed. There is no additional wounds. The traumatic left second toe is also closed. The 4 that he has are all better 7/1; all of the patient's wounds are smaller except for the proximal one on the left lateral calf. We are using silver collagen 7/22; patient has home health. Relatively refractory wounds on the lateral part of both mid calfs. Each of them 2 wounds. The areas on the left are doing much better in fact the distal 1 looks like it is on his way to closure. The right required debridement with adherent fibrinous debris under illumination. We have been using silver collagen. Previous arterial studies were within normal limits he also had venous reflux studies 04/03/2020 upon evaluation today patient appears to be doing excellent with regard to his lower extremity ulcers. He is going to require a little bit of debridement on the wounds on the right especially in order to clear away some biofilm and slough but this is minimal and overall he seems to be doing quite well. 8/19; the patient's wounds on the left lateral lower leg just above closed. However the 2 on the lateral part of the right have a nonviable necrotic surface. This is disappointing. There is also no change in dimensions. He has had arterial studies as well as venous reflux studies 8/26; very disappointing today. Even the more superficial areas on the left are about the same as last week. Still 2 punched-out areas completely unchanged on the right. We have been using silver collagen really making  no  progress. 9/2; changed to Iodoflex last week better looking wound surfaces especially on the right although they are deep and punched out. The areas on the left are more superficial open 1 of these is almost fully epithelialized although it has been this way for at least 2 weeks. He has a 20% co-pay [Medicare only] unaffordable for a skin substitute. Had some thought about a snap VAC on the deep areas on the right leg we will try to put this through in the insurance 9/9; punched-out wounds on the bilateral lateral lower extremities. We have been operating as if the these were chronic venous wounds with secondary lymphedema. The areas on the left have been doing well in fact one of them is closed over. We have had no improvement in the areas on the right we have been using Iodoflex to help with surface debridement. The areas on the right are deeper wider. I do not see any evidence of infection. Home health is not been putting the wraps on high enough he has localized lymphedema right above the wounds. He has had arterial and venous studies already 9/16; punched-out mirror-image wounds on his bilateral lateral mid calfs. We have 1 closed on the left lateral the other smaller. For the first time today the areas on the right lateral actually looks some better. I did a biopsy of 1 of these last time although we still do not have this result. HOWEVER he arrives in clinic today complaining of chest pain overnight which felt like a brick on his chest. He also had 2 black watery bowel movements. He does not have nausea or vomiting. He is not describing abdominal pain. He has no prior history of diarrhea heartburn etc. 9/30; sent this patient to the ER on 9/16. 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 He is on twice daily PPIs Protonix. His admission hemoglobin was exceptionally low at 5. 8.. Discharged at 8.1. 4 unit transfusion. 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 His diuretic dose was actually decreased because of elevated creatinine currently at torsemide 20 mg not felt to be a Coumadin candidate. Patient states he feels well. The areas on his left leg are healed. The areas on the right look better. These have been very refractory wounds. He does not have stockings we will make arrangements for this today 10/7; the areas on the left leg are healed we will transition him into his 20/30 stocking today. Continued improvement in the areas on the right lateral leg 10/21; the areas on the left leg remain healed at this 2-week follow-up however there is a history that he developed some drainage from one of the wound sites with home health therefore going ahead and wrapping him. He was supposed to be on 20/30 stockings although the history here is vague and I am not sure that this represents a stocking failure or not. We have been wrapping his right leg with the one remaining wound is the distal wound. We have been using silver collagen 10/28 the patient only has 1 wound remaining on the right lower calf. This still has 3 mm of depth which is unchanged but the surface area is smaller. The superior wound on the right lateral is closed. The 2 wounds on the left remain closed and he is using his compression stocking 11/4 1 remaining wound on the right lower calf. Under illumination  not a viable surface we have been using silver collagen we had good effect on the other areas but this 1 appears to be stalled. There is no open area on the left leg he is using his own stocking. 11/11; 1 remaining wound on the right lower calf. Surface of this is a lot better than last week but still requiring debridement I am using Iodoflex but looking forward to changing the primary dressing to perhaps endoform 11/18; 1 remaining wound on the right lower leg, venous insufficiency, vigorous debridement last week/Iodoflex. Depth  of the wound is therefore larger but the wound looks clean. Still very gritty material at the surface requiring debridement 12/2; the remaining wound on his right lower leg, venous insufficiency. I changed him to endoform 2 weeks ago. We appear to be making nice progress. Wound is measuring smaller 12/16; he comes in today with one of his two wounds on the left opened infected second area on the left may be threatened as well as the healed one on the right. He has edema in both legs which I think is pitting. He says his weights are stable he has unknown fairly severe cardiomyopathy. His cardiologist is Dr. Brigitte Pulse it does not sound like he is really been carefully followed 12/23; the areas that I was concerned about on the left look a lot better we put some silver alginate and put him back in compression. It may be the compression that actually does the trick here. The remaining open area we have been using Iodoflex and again this looks a lot better. At our suggestion he went to see his primary who did not adjust his diuretic he is still apparently taking 20 mg of a loop diuretic and "sometimes" 40 mg is basically what the patient stated 1/6; the area on the left is closed once again. We will transition him into his 20/30 stocking from elastic therapy both areas on the right are now open which is a deterioration from last time at which time the only 1 was open but they are very superficial. We have been using Hydrofera Blue under compression 1/27; its been 3 weeks since the patient was here. We transitioned him to a 20/30 stocking on the left leg. Still under 4-layer compression on the right. He comes in today with a new small opening on the left leg but reopening on the right. Massive blistering on the right leg. He has severe systemic fluid volume overload. I have looked over the patient's records. He was seen in 2020 by tele health by cardiology. 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 I think he was supposed to have follow-up but that follow-up never seems to have happened. He does not complain of chest pain or shortness of breath but notes increasing edema not just in his lower legs 2/3; he has new open areas on the bilateral anterior lower legs. I think a lot of this is because of systemic fluid volume overload. The ultrasound I ordered showed ascites and suggestion of cirrhosis. His physical exam continues to suggest right greater than left congestive heart failure. I have him taking 40 mg of Lasix oral daily I have asked him to start weighing himself daily. With the intense efforts of our nursing staff we have him a cardiology appointment on Monday or Tuesday of next week I have told him not to miss this. With regards to his wounds I do not think we are  going to be able to stop wrapping him until somebody manages to get his fluid volume down Objective Constitutional Sitting or standing Blood Pressure is within target range for patient.. Pulse regular and within target range for patient.Marland Kitchen Respirations regular, non-labored and within target range.. Temperature is normal and within the target range for the patient.Marland Kitchen Appears in no distress. Vitals Time Taken: 7:50 AM, Height: 71 in, Weight: 198 lbs, BMI: 27.6, Temperature: 97.8 F, Pulse: 105 bpm, Respiratory Rate: 20 breaths/min, Blood Pressure: 118/78 mmHg. Respiratory work of breathing is normal. Crackles bilaterally at the bases. Cardiovascular I suspect he has mitral and tricuspid regurgitant murmurs. JVP is elevated to the angle of the jaw at about 80 degrees tall V wave. Edema present in both extremities. Pitting edema extending well up into his thighs. Gastrointestinal (GI) Massive abdominal distention. General Notes: Wound exam; his wounds do not look too bad although he has had further breakdown on the anterior part of both mid tibia area as well as the areas laterally. No  evidence of infection. Where we are wrapping he has decent edema control Integumentary (Hair, Skin) Wound #11 status is Open. Original cause of wound was Gradually Appeared. The wound is located on the Right,Proximal,Lateral Lower Leg. The wound measures 1.5cm length x 0.9cm width x 0.1cm depth; 1.06cm^2 area and 0.106cm^3 volume. There is Fat Layer (Subcutaneous Tissue) exposed. There is no tunneling or undermining noted. There is a medium amount of serous drainage noted. The wound margin is flat and intact. There is medium (34-66%) pink, pale granulation within the wound bed. There is a medium (34-66%) amount of necrotic tissue within the wound bed including Adherent Slough. Wound #12 status is Open. Original cause of wound was Gradually Appeared. The wound is located on the Left,Anterior Lower Leg. The wound measures 0.7cm length x 0.6cm width x 0.1cm depth; 0.33cm^2 area and 0.033cm^3 volume. There is Fat Layer (Subcutaneous Tissue) exposed. There is no tunneling or undermining noted. There is a medium amount of serosanguineous drainage noted. The wound margin is distinct with the outline attached to the wound base. There is large (67-100%) red granulation within the wound bed. There is a small (1-33%) amount of necrotic tissue within the wound bed including Adherent Slough. Wound #13 status is Open. Original cause of wound was Blister. The wound is located on the Right,Anterior Lower Leg. The wound measures 2.2cm length x 0.9cm width x 0.1cm depth; 1.555cm^2 area and 0.156cm^3 volume. There is Fat Layer (Subcutaneous Tissue) exposed. There is no tunneling or undermining noted. There is a medium amount of serosanguineous drainage noted. The wound margin is flat and intact. There is large (67-100%) pink granulation within the wound bed. There is no necrotic tissue within the wound bed. Wound #14 status is Open. Original cause of wound was Blister. The wound is located on the Left,Lateral Lower Leg.  The wound measures 1.2cm length x 1cm width x 0.2cm depth; 0.942cm^2 area and 0.188cm^3 volume. There is Fat Layer (Subcutaneous Tissue) exposed. There is no tunneling or undermining noted. There is a medium amount of serosanguineous drainage noted. The wound margin is flat and intact. There is large (67-100%) pink, pale granulation within the wound bed. There is no necrotic tissue within the wound bed. Wound #4 status is Open. Original cause of wound was Blister. The wound is located on the Right,Distal,Lateral Lower Leg. The wound measures 1.3cm length x 1.1cm width x 0.1cm depth; 1.123cm^2 area and 0.112cm^3 volume. There is Fat Layer (Subcutaneous Tissue) exposed. There  is no tunneling or undermining noted. There is a medium amount of serous drainage noted. The wound margin is flat and intact. There is large (67-100%) pink granulation within the wound bed. There is a small (1-33%) amount of necrotic tissue within the wound bed including Adherent Slough. Assessment Active Problems ICD-10 Chronic venous hypertension (idiopathic) with inflammation of bilateral lower extremity Lymphedema, not elsewhere classified Non-pressure chronic ulcer of other part of left lower leg with fat layer exposed Non-pressure chronic ulcer of other part of right lower leg with fat layer exposed Chronic systolic (congestive) heart failure Procedures Wound #11 Pre-procedure diagnosis of Wound #11 is a Venous Leg Ulcer located on the Right,Proximal,Lateral Lower Leg . There was a Four Layer Compression Therapy Procedure by Baruch Gouty, RN. Post procedure Diagnosis Wound #11: Same as Pre-Procedure Wound #12 Pre-procedure diagnosis of Wound #12 is a Venous Leg Ulcer located on the Left,Anterior Lower Leg . There was a Four Layer Compression Therapy Procedure by Baruch Gouty, RN. Post procedure Diagnosis Wound #12: Same as Pre-Procedure Wound #13 Pre-procedure diagnosis of Wound #13 is a Venous Leg Ulcer  located on the Right,Anterior Lower Leg . There was a Four Layer Compression Therapy Procedure by Baruch Gouty, RN. Post procedure Diagnosis Wound #13: Same as Pre-Procedure Wound #14 Pre-procedure diagnosis of Wound #14 is a Venous Leg Ulcer located on the Left,Lateral Lower Leg . There was a Four Layer Compression Therapy Procedure by Baruch Gouty, RN. Post procedure Diagnosis Wound #14: Same as Pre-Procedure Wound #4 Pre-procedure diagnosis of Wound #4 is a Venous Leg Ulcer located on the Right,Distal,Lateral Lower Leg . There was a Four Layer Compression Therapy Procedure by Baruch Gouty, RN. Post procedure Diagnosis Wound #4: Same as Pre-Procedure Plan Follow-up Appointments: Return Appointment in 1 week. Bathing/ Shower/ Hygiene: May shower with protection but do not get wound dressing(s) wet. May shower and wash wound with soap and water. - with dressing changes only. Edema Control - Lymphedema / SCD / Other: Elevate legs to the level of the heart or above for 30 minutes daily and/or when sitting, a frequency of: - 3-4 times a day throughout the day. Avoid standing for long periods of time. Exercise regularly Additional Orders / Instructions: Other: - patient to weigh daily, write it down, and take to cardiology. Patient to call [facility] today to reschedule cardiology appt for Monday or Tuesday of next week-10/06/2020 or 10/07/2020. Home Health: New wound care orders this week; continue Home Health for wound care. May utilize formulary equivalent dressing for wound treatment orders unless otherwise specified. - [facility] home [facility]. WOUND #11: - Lower Leg Wound Laterality: Right, Lateral, Proximal Cleanser: Soap and Water (Home Health) 2 x Per Week/30 Days Discharge Instructions: May shower and wash wound with dial antibacterial soap and water prior to dressing change. Peri-Wound Care: Triamcinolone 15 (g) 2 x Per Week/30 Days Discharge  Instructions: Mixed with lotion in clinic only. Use triamcinolone 15 (g) as directed Peri-Wound Care: Sween Lotion (Moisturizing lotion) (Home Health) 2 x Per Week/30 Days Discharge Instructions: Apply moisturizing lotion as directed Prim Dressing: KerraCel Ag Gelling Fiber Dressing, 4x5 in (silver alginate) (Home Health) 2 x Per Week/30 Days ary Discharge Instructions: Apply silver alginate to wound bed as instructed Secondary Dressing: Woven Gauze Sponge, Non-Sterile 4x4 in (Home Health) 2 x Per Week/30 Days Discharge Instructions: Apply over primary dressing as directed. Secondary Dressing: ABD Pad, 8x10 (Home Health) 2 x Per Week/30 Days Discharge Instructions: Apply over primary  dressing as directed. Com pression Wrap: FourPress (4 layer compression wrap) (Home Health) 2 x Per Week/30 Days Discharge Instructions: Apply four layer compression as directed. WOUND #12: - Lower Leg Wound Laterality: Left, Anterior Cleanser: Soap and Water (Home Health) 2 x Per Week/30 Days Discharge Instructions: May shower and wash wound with dial antibacterial soap and water prior to dressing change. Peri-Wound Care: Triamcinolone 15 (g) 2 x Per Week/30 Days Discharge Instructions: Mixed with lotion in clinic only. Use triamcinolone 15 (g) as directed Peri-Wound Care: Sween Lotion (Moisturizing lotion) (Home Health) 2 x Per Week/30 Days Discharge Instructions: Apply moisturizing lotion as directed Prim Dressing: KerraCel Ag Gelling Fiber Dressing, 4x5 in (silver alginate) (Home Health) 2 x Per Week/30 Days ary Discharge Instructions: Apply silver alginate to wound bed as instructed Secondary Dressing: Woven Gauze Sponge, Non-Sterile 4x4 in (Home Health) 2 x Per Week/30 Days Discharge Instructions: Apply over primary dressing as directed. Secondary Dressing: ABD Pad, 8x10 (Home Health) 2 x Per Week/30 Days Discharge Instructions: Apply over primary dressing as directed. Com pression Wrap: FourPress (4  layer compression wrap) (Home Health) 2 x Per Week/30 Days Discharge Instructions: Apply four layer compression as directed. WOUND #13: - Lower Leg Wound Laterality: Right, Anterior Cleanser: Soap and Water (Home Health) 2 x Per Week/30 Days Discharge Instructions: May shower and wash wound with dial antibacterial soap and water prior to dressing change. Peri-Wound Care: Triamcinolone 15 (g) 2 x Per Week/30 Days Discharge Instructions: Mixed with lotion in clinic only. Use triamcinolone 15 (g) as directed Peri-Wound Care: Sween Lotion (Moisturizing lotion) (Home Health) 2 x Per Week/30 Days Discharge Instructions: Apply moisturizing lotion as directed Prim Dressing: KerraCel Ag Gelling Fiber Dressing, 4x5 in (silver alginate) (Home Health) 2 x Per Week/30 Days ary Discharge Instructions: Apply silver alginate to wound bed as instructed Secondary Dressing: Woven Gauze Sponge, Non-Sterile 4x4 in (Home Health) 2 x Per Week/30 Days Discharge Instructions: Apply over primary dressing as directed. Secondary Dressing: ABD Pad, 8x10 (Home Health) 2 x Per Week/30 Days Discharge Instructions: Apply over primary dressing as directed. Com pression Wrap: FourPress (4 layer compression wrap) (Home Health) 2 x Per Week/30 Days Discharge Instructions: Apply four layer compression as directed. WOUND #14: - Lower Leg Wound Laterality: Left, Lateral Cleanser: Soap and Water (Home Health) 2 x Per Week/30 Days Discharge Instructions: May shower and wash wound with dial antibacterial soap and water prior to dressing change. Peri-Wound Care: Triamcinolone 15 (g) 2 x Per Week/30 Days Discharge Instructions: Mixed with lotion in clinic only. Use triamcinolone 15 (g) as directed Peri-Wound Care: Sween Lotion (Moisturizing lotion) (Home Health) 2 x Per Week/30 Days Discharge Instructions: Apply moisturizing lotion as directed Prim Dressing: KerraCel Ag Gelling Fiber Dressing, 4x5 in (silver alginate) (Home Health) 2  x Per Week/30 Days ary Discharge Instructions: Apply silver alginate to wound bed as instructed Secondary Dressing: Woven Gauze Sponge, Non-Sterile 4x4 in (Home Health) 2 x Per Week/30 Days Discharge Instructions: Apply over primary dressing as directed. Secondary Dressing: ABD Pad, 8x10 (Home Health) 2 x Per Week/30 Days Discharge Instructions: Apply over primary dressing as directed. Com pression Wrap: FourPress (4 layer compression wrap) (Home Health) 2 x Per Week/30 Days Discharge Instructions: Apply four layer compression as directed. WOUND #4: - Lower Leg Wound Laterality: Right, Lateral, Distal Cleanser: Soap and Water (Home Health) 2 x Per Week/30 Days Discharge Instructions: May shower and wash wound with dial antibacterial soap and water prior to dressing change. Peri-Wound Care: Triamcinolone 15 (g) 2  x Per Week/30 Days Discharge Instructions: Mixed with lotion in clinic only. Use triamcinolone 15 (g) as directed Peri-Wound Care: Sween Lotion (Moisturizing lotion) (Home Health) 2 x Per Week/30 Days Discharge Instructions: Apply moisturizing lotion as directed Prim Dressing: KerraCel Ag Gelling Fiber Dressing, 4x5 in (silver alginate) (Home Health) 2 x Per Week/30 Days ary Discharge Instructions: Apply silver alginate to wound bed as instructed Secondary Dressing: Woven Gauze Sponge, Non-Sterile 4x4 in (Home Health) 2 x Per Week/30 Days Discharge Instructions: Apply over primary dressing as directed. Secondary Dressing: ABD Pad, 8x10 (Home Health) 2 x Per Week/30 Days Discharge Instructions: Apply over primary dressing as directed. Com pression Wrap: FourPress (4 layer compression wrap) (Home Health) 2 x Per Week/30 Days Discharge Instructions: Apply four layer compression as directed. 1. I have advised him to continue his Lasix 40 mg and emphasized the necessity to follow-up with cardiology at the [facility] clinic. With the intense efforts of her nursing staff we managed to get him  seen on Monday or Tuesday next week but it is up to the patient to call and finalize this appointment I have asked him to weigh himself daily to write these down and to bring it to the appointment 2. 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 Previous review of cardiology investigations including echocardiograms have shown a severe cardiomyopathy presumably ischemic. 3. 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 4. The easy part of this is the wounds on his lower extremities will continue with silver alginate and 4-layer compression I do not think these are going to heal until we managed to get his systemic fluid volume under control and I have emphasized this to the patient 5. He has ascites and the ultrasound morphology suggestive of the possibility of cirrhosis. I have not looked back on this. I think he needs to be diuresed and the thought about evaluating his cirrhosis can be left to a later date Electronic Signature(s) Signed: 10/02/2020 4:57:15 PM By: Linton Ham MD Entered By: Linton Ham on 10/02/2020 08:46:56 -------------------------------------------------------------------------------- SuperBill Details Patient Name: Date of Service: [place] 10/02/2020 Medical Record Number: [id number] Patient Account Number: [id number] Date of Birth/Sex: Treating RN: 11/21/1940 (80 y.o. [name], [name] Primary Care Provider: [name]., [name] Other Clinician: Referring Provider: Treating Provider/Extender: [name], [name] in Treatment: 51 Diagnosis Coding ICD-10 Codes Code Description [phone] Chronic venous hypertension (idiopathic) with inflammation of bilateral lower extremity I89.0 Lymphedema, not elsewhere classified L97.822 Non-pressure chronic ulcer of other part of left lower leg with fat layer exposed L97.812 Non-pressure chronic ulcer of other part of right  lower leg with fat layer exposed S90.812D Abrasion, left foot, subsequent encounter I42.9 Cardiomyopathy, unspecified K92.1 [place] Facility Procedures The patient participates with Medicare or their insurance follows the Medicare Facility Guidelines: CPT4 Description Modifier Quantity Code [id number] BILATERAL: Application of multi-layer venous compression system; leg (below knee), including ankle and 1 foot. Physician Procedures : CPT4 Code Description Modifier [id number] - WC PHYS LEVEL 4 - EST PT ICD-10 Diagnosis Description I87.323 Chronic venous hypertension (idiopathic) with inflammation of bilateral lower extremity L97.822 Non-pressure chronic ulcer of other part of  left lower leg with fat layer exposed L97.812 Non-pressure chronic ulcer of other part of right lower leg with fat layer exposed I89.0 Lymphedema, not elsewhere classified Quantity: 1 Electronic Signature(s) Signed: 10/02/2020 4:57:15 PM By: Linton Ham MD Entered By: Linton Ham  on 10/02/2020 08:47:17

## 2020-10-03 NOTE — Progress Notes (Signed)
AVIS, MCMAHILL ([id number]) Visit Report for 10/02/2020 Arrival Information Details Patient Name: Date of Service: [name], [name] 10/02/2020 7:30 A M Medical Record Number: [id number] Patient Account Number: [id number] Date of Birth/Sex: Treating RN: Dec 08, 1940 (80 y.o. [name] Primary Care Provider: [name]., [name] Other Clinician: Referring Provider: Treating Provider/Extender: [name], [name] in Treatment: 68 Visit Information History Since Last Visit All ordered tests and consults were completed: Yes Patient Arrived: Gilford Rile Added or deleted any medications: No Arrival Time: 07:49 Any new allergies or adverse reactions: No Accompanied By: alone Had a fall or experienced change in No Transfer Assistance: None activities of daily living that may affect Patient Requires Transmission-Based Precautions: No risk of falls: Patient Has Alerts: Yes Signs or symptoms of abuse/neglect since last visito No Patient Alerts: Patient on Blood Thinner Hospitalized since last visit: No Implantable device outside of the clinic excluding No cellular tissue based products placed in the center since last visit: Has Dressing in Place as Prescribed: Yes Has Compression in Place as Prescribed: Yes Pain Present Now: No Electronic Signature(s) Signed: 10/03/2020 4:51:44 PM By: Levan Hurst RN, BSN Entered By: Levan Hurst on 10/02/2020 07:50:32 -------------------------------------------------------------------------------- Compression Therapy Details Patient Name: Date of Service: Troy [place] 10/02/2020 7:30 A M Medical Record Number: [id number] Patient Account Number: [id number] Date of Birth/Sex: Treating RN: July 22, 1941 (80 y.o. [name] Primary Care Provider: [name]., [name] Other Clinician: Referring Provider: Treating Provider/Extender: [name], [name] in Treatment: 65 Compression Therapy  Performed for Wound Assessment: Wound #11 Right,Proximal,Lateral Lower Leg Performed By: Clinician Baruch Gouty, RN Compression Type: Four Layer Post Procedure Diagnosis Same as Pre-procedure Electronic Signature(s) Signed: 10/02/2020 5:30:16 PM By: Deon Pilling Entered By: Deon Pilling on 10/02/2020 08:34:56 -------------------------------------------------------------------------------- Compression Therapy Details Patient Name: Date of Service: Troy [name] [facility] 10/02/2020 7:30 A M Medical Record Number: [id number] Patient Account Number: [id number] Date of Birth/Sex: Treating RN: 1941/07/04 (80 y.o. [name] Primary Care Provider: [name]., [name] Other Clinician: Referring Provider: Treating Provider/Extender: [name], [name] in Treatment: 39 Compression Therapy Performed for Wound Assessment: Wound #13 Right,Anterior Lower Leg Performed By: Clinician Baruch Gouty, RN Compression Type: Four Layer Post Procedure Diagnosis Same as Pre-procedure Electronic Signature(s) Signed: 10/02/2020 5:30:16 PM By: Deon Pilling Entered By: Deon Pilling on 10/02/2020 08:34:56 -------------------------------------------------------------------------------- Compression Therapy Details Patient Name: Date of Service: Troy [place] [name] [facility] [place] 10/02/2020 7:30 A M Medical Record Number: [id number] Patient Account Number: [id number] Date of Birth/Sex: Treating RN: 09-29-1940 (80 y.o. [name] Primary Care Provider: [name]., [name] Other Clinician: Referring Provider: Treating Provider/Extender: [name], [name] in Treatment: 57 Compression Therapy Performed for Wound Assessment: Wound #12 Left,Anterior Lower Leg Performed By: Clinician Baruch Gouty, RN Compression Type: Four Layer Post Procedure Diagnosis Same as Pre-procedure Electronic Signature(s) Signed: 10/02/2020 5:30:16 PM By: Deon Pilling Entered  By: Deon Pilling on 10/02/2020 08:34:56 -------------------------------------------------------------------------------- Compression Therapy Details Patient Name: Date of Service: Troy [place] [name] [facility] 10/02/2020 7:30 A M Medical Record Number: [id number] Patient Account Number: [id number] Date of Birth/Sex: Treating RN: July 22, 1941 (80 y.o. [name], [name] Primary Care Provider: [name]., [name] Other Clinician: Referring Provider: Treating Provider/Extender: [name], [name] in Treatment: 53 Compression Therapy Performed for Wound Assessment: Wound #  [place] Leg Performed By: Clinician Baruch Gouty, RN Compression Type: Four Layer Post Procedure Diagnosis Same as Pre-procedure Electronic Signature(s) Signed: 10/02/2020 5:30:16 PM By: Deon Pilling Entered By: Deon Pilling on 10/02/2020 08:34:56 -------------------------------------------------------------------------------- Compression Therapy Details Patient Name: Date of Service: Troy [place] [name] [facility] 10/02/2020 7:30 [place] Record Number: [id number] Patient Account Number: [id number] Date of Birth/Sex: Treating RN: 05-08-1941 (80 y.o. [name] Primary Care Provider: [name]., [name] Other Clinician: Referring Provider: Treating Provider/Extender: [name], [name] in Treatment: 17 Compression Therapy Performed for Wound Assessment: Wound #4 Right,Distal,Lateral Lower Leg Performed By: Clinician Baruch Gouty, RN Compression Type: Four Layer Post Procedure Diagnosis Same as Pre-procedure Electronic Signature(s) Signed: 10/02/2020 5:30:16 PM By: Deon Pilling Entered By: Deon Pilling on 10/02/2020 08:34:56 -------------------------------------------------------------------------------- Encounter Discharge Information Details Patient Name: Date of Service: Troy [place] 10/02/2020 7:30 A M Medical Record Number:  [id number] Patient Account Number: [id number] Date of Birth/Sex: Treating RN: [birth year] (80 y.o. [name] Primary Care Provider: [name]., [name] Other Clinician: Referring Provider: Treating Provider/Extender: [name], [name] in Treatment: 43 Encounter Discharge Information Items Discharge Condition: Stable Ambulatory Status: Walker Discharge Destination: Home Transportation: Private Auto Accompanied By: self Schedule Follow-up Appointment: Yes Clinical Summary of Care: Patient Declined Electronic Signature(s) Signed: 10/02/2020 5:53:40 PM By: Baruch Gouty RN, BSN Entered By: Baruch Gouty on 10/02/2020 09:03:02 -------------------------------------------------------------------------------- Lower Extremity Assessment Details Patient Name: Date of Service: Troy [place] 10/02/2020 7:30 A M Medical Record Number: [id number] Patient Account Number: [id number] Date of Birth/Sex: Treating RN: August 27, 1941 (80 y.o. [name] Primary Care Provider: [name]., [name] Other Clinician: Referring Provider: Treating Provider/Extender: [name], [name] in Treatment: 51 Edema Assessment Assessed: [Left: No] [Right: No] Edema: [Left: Yes] [Right: Yes] Calf Left: Right: Point of Measurement: 53 cm From Medial Instep 32 cm 31.5 cm Ankle Left: Right: Point of Measurement: 14 cm From Medial Instep 21.8 cm 23.2 cm Vascular Assessment Pulses: Dorsalis Pedis Palpable: [Left:Yes] [Right:Yes] Electronic Signature(s) Signed: 10/03/2020 4:51:44 PM By: Levan Hurst RN, BSN Entered By: Levan Hurst on 10/02/2020 08:09:51 -------------------------------------------------------------------------------- Multi Wound Chart Details Patient Name: Date of Service: Troy [place] [address] [place] 10/02/2020 7:30 A M Medical Record Number: [id number] Patient Account Number: [id number] Date of Birth/Sex: Treating  RN: [birth year] (80 y.o. [name], [name] Primary Care Provider: [name]., [name] Other Clinician: Referring Provider: Treating Provider/Extender: [name], [name] in Treatment: 33 Vital Signs Height(in): 71 Pulse(bpm): 105 Weight(lbs): 198 Blood Pressure(mmHg): 118/78 Body Mass Index(BMI): 28 Temperature(F): 97.8 Respiratory Rate(breaths/min): 20 Photos: [11:No Photos Right, Proximal, Lateral Lower Leg] [12:No Photos Left, Anterior Lower Leg] [13:No Photos Right, Anterior Lower Leg] Wound Location: [11:Gradually Appeared] [12:Gradually Appeared] [13:Blister] Wounding Event: [11:Venous Leg Ulcer] [12:Venous Leg Ulcer] [13:Venous Leg Ulcer] Primary Etiology: [11:N/A] [12:Lymphedema] [13:N/A] Secondary Etiology: [11:Anemia, Congestive Heart Failure,] [12:Anemia, Congestive Heart Failure,] [13:Anemia, Congestive Heart Failure,] Comorbid History: [11:Hypertension, Peripheral Venous Disease, End Stage Renal Disease 09/04/2020] [12:Hypertension, Peripheral Venous Disease, End Stage Renal Disease 09/25/2020] [13:Hypertension, Peripheral Venous Disease, End Stage Renal Disease  10/02/2020] Date Acquired: [11:4] [12:1] [13:0] Weeks of Treatment: [11:Open] [12:Open] [13:Open] Wound Status: [11:No] [12:No] [13:Yes] Clustered Wound: [11:N/A] [12:N/A] [13:2] Clustered Quantity: [11:1.5x0.9x0.1] [12:0.7x0.6x0.1] [13:2.2x0.9x0.1] Measurements L x W x D (cm) [11:1.06] [12:0.33] [13:1.555] A (cm) : rea [11:0.106] [12:0.033] [  13:0.156] Volume (cm) : [11:-125.10%] [12:72.00%] [13:0.00%] % Reduction in Area: [11:-125.50%] [12:72.00%] [13:0.00%] % Reduction in Volume: [11:Full Thickness Without Exposed] [12:Full Thickness Without Exposed] [13:Full Thickness Without Exposed] Classification: [11:Support Structures Medium] [12:Support Structures Medium] [13:Support Structures Medium] Exudate Amount: [11:Serous] [12:Serosanguineous] [13:Serosanguineous] Exudate Type: [11:amber]  [12:red, brown] [13:red, brown] Exudate Color: [11:Flat and Intact] [12:Distinct, outline attached] [13:Flat and Intact] Wound Margin: [11:Medium (34-66%)] [12:Large (67-100%)] [13:Large (67-100%)] Granulation Amount: [11:Pink, Pale] [12:Red] [13:Pink] Granulation Quality: [11:Medium (34-66%)] [12:Small (1-33%)] [13:None Present (0%)] Necrotic Amount: [11:Fat Layer (Subcutaneous Tissue): Yes Fat Layer (Subcutaneous Tissue): Yes Fat Layer (Subcutaneous Tissue): Yes] Exposed Structures: [11:Fascia: No Tendon: No Muscle: No Joint: No Bone: No Small (1-33%)] [12:Fascia: No Tendon: No Muscle: No Joint: No Bone: No Medium (34-66%)] [13:Fascia: No Tendon: No Muscle: No Joint: No Bone: No None] Epithelialization: [11:Compression Therapy] [12:Compression Therapy] [13:Compression Therapy] Wound Number: 14 4 N/A Photos: No Photos No Photos N/A Left, Lateral Lower Leg Right, Distal, Lateral Lower Leg N/A Wound Location: Blister Blister N/A Wounding Event: Venous Leg Ulcer Venous Leg Ulcer N/A Primary Etiology: N/A N/A N/A Secondary Etiology: Anemia, Congestive Heart Failure, Anemia, Congestive Heart Failure, N/A Comorbid History: Hypertension, Peripheral Venous Hypertension, Peripheral Venous Disease, End Stage Renal Disease Disease, End Stage Renal Disease 10/02/2020 09/11/2019 N/A Date Acquired: 0 51 N/A Weeks of Treatment: Open Open N/A Wound Status: No No N/A Clustered Wound: N/A N/A N/A Clustered Quantity: 1.2x1x0.2 1.3x1.1x0.1 N/A Measurements L x W x D (cm) 0.942 1.123 N/A A (cm) : rea 0.188 0.112 N/A Volume (cm) : N/A 87.90% N/A % Reduction in Area: N/A 88.00% N/A % Reduction in Volume: Full Thickness Without Exposed Full Thickness Without Exposed N/A Classification: Support Structures Support Structures Medium Medium N/A Exudate Amount: Serosanguineous Serous N/A Exudate Type: red, brown amber N/A Exudate Color: Flat and Intact Flat and Intact N/A Wound  Margin: Large (67-100%) Large (67-100%) N/A Granulation Amount: Pink, Pale Pink N/A Granulation Quality: None Present (0%) Small (1-33%) N/A Necrotic Amount: Fat Layer (Subcutaneous Tissue): Yes Fat Layer (Subcutaneous Tissue): Yes N/A Exposed Structures: Fascia: No Fascia: No Tendon: No Tendon: No Muscle: No Muscle: No Joint: No Joint: No Bone: No Bone: No None Small (1-33%) N/A Epithelialization: Compression Therapy Compression Therapy N/A Procedures Performed: Treatment Notes Electronic Signature(s) Signed: 10/02/2020 4:57:15 PM By: Linton Ham MD Signed: 10/02/2020 5:30:16 PM By: Deon Pilling Entered By: Linton Ham on 10/02/2020 08:40:47 -------------------------------------------------------------------------------- Multi-Disciplinary Care Plan Details Patient Name: Date of Service: Troy [place] 10/02/2020 7:30 A M Medical Record Number: [id number] Patient Account Number: [id number] Date of Birth/Sex: Treating RN: 01-31-1941 (80 y.o. [name], [name] Primary Care Provider: [name]., [name] Other Clinician: Referring Provider: Treating Provider/Extender: [name], [name] in Treatment: 21 Active Inactive Venous Leg Ulcer Nursing Diagnoses: Knowledge deficit related to disease process and management Potential for venous Insuffiency (use before diagnosis confirmed) Goals: Patient will maintain optimal edema control Date Initiated: 05/01/2020 Target Resolution Date: 10/31/2020 Goal Status: Active Interventions: Assess peripheral edema status every visit. Compression as ordered Provide education on venous insufficiency Notes: Electronic Signature(s) Signed: 10/02/2020 5:30:16 PM By: Deon Pilling Entered By: Deon Pilling on 10/02/2020 08:02:30 -------------------------------------------------------------------------------- Pain Assessment Details Patient Name: Date of Service: Troy [place] [name] [facility] 10/02/2020 7:30 [place] Record Number: [id number] Patient Account Number: [id number] Date of Birth/Sex: Treating RN: 09/29/1940 (80 y.o. [name] Primary Care Provider: [name]., [name] Other Clinician: Referring Provider: Treating  Provider/Extender: [name], [name] in Treatment: 51 Active Problems Location of Pain Severity and Description of Pain Patient Has Paino No Site Locations Pain Management and Medication Current Pain Management: Electronic Signature(s) Signed: 10/03/2020 4:51:44 PM By: Levan Hurst RN, BSN Entered By: Levan Hurst on 10/02/2020 07:50:54 -------------------------------------------------------------------------------- Patient/Caregiver Education Details Patient Name: Date of Service: Troy [place] 2/3/2022andnbsp7:30 A M Medical Record Number: [id number] Patient Account Number: [id number] Date of Birth/Gender: Treating RN: [birth year] (80 y.o. [name] Primary Care Physician: [name]., [name] Other Clinician: Referring Physician: Treating Physician/Extender: [name], [name] in Treatment: 70 Education Assessment Education Provided To: Patient Education Topics Provided Venous: Handouts: Managing Venous Disease and Related Ulcers Methods: Explain/Verbal Responses: Reinforcements needed Electronic Signature(s) Signed: 10/02/2020 5:30:16 PM By: Deon Pilling Entered By: Deon Pilling on 10/02/2020 08:02:42 -------------------------------------------------------------------------------- Wound Assessment Details Patient Name: Date of Service: Troy [name] 10/02/2020 7:30 A M Medical Record Number: [id number] Patient Account Number: [id number] Date of Birth/Sex: Treating RN: 1940/09/23 (80 y.o. [name] Primary Care Provider: [name]., [name] Other Clinician: Referring Provider: Treating Provider/Extender: [name], [name] in  Treatment: 51 Wound Status Wound Number: 11 Primary Venous Leg Ulcer Etiology: Wound Location: Right, Proximal, Lateral Lower Leg Wound Open Wounding Event: Gradually Appeared Status: Date Acquired: 09/04/2020 Comorbid Anemia, Congestive Heart Failure, Hypertension, Peripheral Weeks Of Treatment: 4 History: Venous Disease, End Stage Renal Disease Clustered Wound: No Wound Measurements Length: (cm) 1.5 Width: (cm) 0.9 Depth: (cm) 0.1 Area: (cm) 1.06 Volume: (cm) 0.106 % Reduction in Area: -125.1% % Reduction in Volume: -125.5% Epithelialization: Small (1-33%) Tunneling: No Undermining: No Wound Description Classification: Full Thickness Without Exposed Support Structures Wound Margin: Flat and Intact Exudate Amount: Medium Exudate Type: Serous Exudate Color: amber Foul Odor After Cleansing: No Slough/Fibrino Yes Wound Bed Granulation Amount: Medium (34-66%) Exposed Structure Granulation Quality: Pink, Pale Fascia Exposed: No Necrotic Amount: Medium (34-66%) Fat Layer (Subcutaneous Tissue) Exposed: Yes Necrotic Quality: Adherent Slough Tendon Exposed: No Muscle Exposed: No Joint Exposed: No Bone Exposed: No Treatment Notes Wound #11 (Lower Leg) Wound Laterality: Right, Lateral, Proximal Cleanser Soap and Water Discharge Instruction: May shower and wash wound with dial antibacterial soap and water prior to dressing change. Peri-Wound Care Triamcinolone 15 (g) Discharge Instruction: Mixed with lotion in clinic only. Use triamcinolone 15 (g) as directed Sween Lotion (Moisturizing lotion) Discharge Instruction: Apply moisturizing lotion as directed Topical Primary Dressing KerraCel Ag Gelling Fiber Dressing, 4x5 in (silver alginate) Discharge Instruction: Apply silver alginate to wound bed as instructed Secondary Dressing Woven Gauze Sponge, Non-Sterile 4x4 in Discharge Instruction: Apply over primary dressing as directed. ABD Pad, 8x10 Discharge Instruction:  Apply over primary dressing as directed. Secured With Compression Wrap FourPress (4 layer compression wrap) Discharge Instruction: Apply four layer compression as directed. Compression Stockings Add-Ons Electronic Signature(s) Signed: 10/03/2020 4:51:44 PM By: Levan Hurst RN, BSN Entered By: Levan Hurst on 10/02/2020 08:08:52 -------------------------------------------------------------------------------- Wound Assessment Details Patient Name: Date of Service: Troy [place] 10/02/2020 7:30 A M Medical Record Number: [id number] Patient Account Number: [id number] Date of Birth/Sex: Treating RN: 02/25/1941 (80 y.o. [name] Primary Care Provider: [name]., [name] Other Clinician: Referring Provider: Treating Provider/Extender: [name], [name] in Treatment: 41 Wound Status Wound Number: 12 Primary Venous Leg Ulcer Etiology: Wound Location: Left, Anterior Lower Leg Secondary  Lymphedema Wounding Event: Gradually Appeared Etiology: Date Acquired: 09/25/2020 Wound Open Weeks Of Treatment: 1 Status: Clustered Wound: No Comorbid Anemia, Congestive Heart Failure, Hypertension, Peripheral History: Venous Disease, End Stage Renal Disease Wound Measurements Length: (cm) 0.7 Width: (cm) 0.6 Depth: (cm) 0.1 Area: (cm) 0.33 Volume: (cm) 0.033 % Reduction in Area: 72% % Reduction in Volume: 72% Epithelialization: Medium (34-66%) Tunneling: No Undermining: No Wound Description Classification: Full Thickness Without Exposed Support Structures Wound Margin: Distinct, outline attached Exudate Amount: Medium Exudate Type: Serosanguineous Exudate Color: red, brown Foul Odor After Cleansing: No Slough/Fibrino Yes Wound Bed Granulation Amount: Large (67-100%) Exposed Structure Granulation Quality: Red Fascia Exposed: No Necrotic Amount: Small (1-33%) Fat Layer (Subcutaneous Tissue) Exposed: Yes Necrotic Quality: Adherent  Slough Tendon Exposed: No Muscle Exposed: No Joint Exposed: No Bone Exposed: No Treatment Notes Wound #12 (Lower Leg) Wound Laterality: Left, Anterior Cleanser Soap and Water Discharge Instruction: May shower and wash wound with dial antibacterial soap and water prior to dressing change. Peri-Wound Care Triamcinolone 15 (g) Discharge Instruction: Mixed with lotion in clinic only. Use triamcinolone 15 (g) as directed Sween Lotion (Moisturizing lotion) Discharge Instruction: Apply moisturizing lotion as directed Topical Primary Dressing KerraCel Ag Gelling Fiber Dressing, 4x5 in (silver alginate) Discharge Instruction: Apply silver alginate to wound bed as instructed Secondary Dressing Woven Gauze Sponge, Non-Sterile 4x4 in Discharge Instruction: Apply over primary dressing as directed. ABD Pad, 8x10 Discharge Instruction: Apply over primary dressing as directed. Secured With Compression Wrap FourPress (4 layer compression wrap) Discharge Instruction: Apply four layer compression as directed. Compression Stockings Add-Ons Electronic Signature(s) Signed: 10/03/2020 4:51:44 PM By: Levan Hurst RN, BSN Entered By: Levan Hurst on 10/02/2020 08:09:13 -------------------------------------------------------------------------------- Wound Assessment Details Patient Name: Date of Service: Troy [place] 10/02/2020 7:30 A M Medical Record Number: [id number] Patient Account Number: [id number] Date of Birth/Sex: Treating RN: May 20, 1941 (80 y.o. [name] Primary Care Provider: [name]., [name] Other Clinician: Referring Provider: Treating Provider/Extender: [name], [name] in Treatment: 51 Wound Status Wound Number: 13 Primary Venous Leg Ulcer Etiology: Wound Location: Right, Anterior Lower Leg Wound Open Wounding Event: Blister Status: Date Acquired: 10/02/2020 Comorbid Anemia, Congestive Heart Failure, Hypertension,  Peripheral Weeks Of Treatment: 0 History: Venous Disease, End Stage Renal Disease Clustered Wound: Yes Wound Measurements Length: (cm) 2.2 Width: (cm) 0.9 Depth: (cm) 0.1 Clustered Quantity: 2 Area: (cm) 1.555 Volume: (cm) 0.156 % Reduction in Area: 0% % Reduction in Volume: 0% Epithelialization: None Tunneling: No Undermining: No Wound Description Classification: Full Thickness Without Exposed Support Structures Wound Margin: Flat and Intact Exudate Amount: Medium Exudate Type: Serosanguineous Exudate Color: red, brown Foul Odor After Cleansing: No Slough/Fibrino No Wound Bed Granulation Amount: Large (67-100%) Exposed Structure Granulation Quality: Pink Fascia Exposed: No Necrotic Amount: None Present (0%) Fat Layer (Subcutaneous Tissue) Exposed: Yes Tendon Exposed: No Muscle Exposed: No Joint Exposed: No Bone Exposed: No Treatment Notes Wound #13 (Lower Leg) Wound Laterality: Right, Anterior Cleanser Soap and Water Discharge Instruction: May shower and wash wound with dial antibacterial soap and water prior to dressing change. Peri-Wound Care Triamcinolone 15 (g) Discharge Instruction: Mixed with lotion in clinic only. Use triamcinolone 15 (g) as directed Sween Lotion (Moisturizing lotion) Discharge Instruction: Apply moisturizing lotion as directed Topical Primary Dressing KerraCel Ag Gelling Fiber Dressing, 4x5 in (silver alginate) Discharge Instruction: Apply silver alginate to wound bed as instructed Secondary Dressing Woven Gauze Sponge, Non-Sterile 4x4 in Discharge Instruction: Apply over primary dressing as directed.  ABD Pad, 8x10 Discharge Instruction: Apply over primary dressing as directed. Secured With Compression Wrap FourPress (4 layer compression wrap) Discharge Instruction: Apply four layer compression as directed. Compression Stockings Add-Ons Electronic Signature(s) Signed: 10/03/2020 4:51:44 PM By: Levan Hurst RN, BSN Entered By:  Levan Hurst on 10/02/2020 08:07:31 -------------------------------------------------------------------------------- Wound Assessment Details Patient Name: Date of Service: Troy [place] 10/02/2020 7:30 A M Medical Record Number: [id number] Patient Account Number: [id number] Date of Birth/Sex: Treating RN: Oct 01, 1940 (80 y.o. [name] Primary Care Provider: [name]., [name] Other Clinician: Referring Provider: Treating Provider/Extender: [name], [name] in Treatment: 51 Wound Status Wound Number: 14 Primary Venous Leg Ulcer Etiology: Wound Location: Left, Lateral Lower Leg Wound Open Wounding Event: Blister Status: Date Acquired: 10/02/2020 Comorbid Anemia, Congestive Heart Failure, Hypertension, Peripheral Weeks Of Treatment: 0 History: Venous Disease, End Stage Renal Disease Clustered Wound: No Wound Measurements Length: (cm) 1.2 Width: (cm) 1 Depth: (cm) 0.2 Area: (cm) 0.942 Volume: (cm) 0.188 % Reduction in Area: % Reduction in Volume: Epithelialization: None Tunneling: No Undermining: No Wound Description Classification: Full Thickness Without Exposed Support Structures Wound Margin: Flat and Intact Exudate Amount: Medium Exudate Type: Serosanguineous Exudate Color: red, brown Foul Odor After Cleansing: No Slough/Fibrino No Wound Bed Granulation Amount: Large (67-100%) Exposed Structure Granulation Quality: Pink, Pale Fascia Exposed: No Necrotic Amount: None Present (0%) Fat Layer (Subcutaneous Tissue) Exposed: Yes Tendon Exposed: No Muscle Exposed: No Joint Exposed: No Bone Exposed: No Treatment Notes Wound #14 (Lower Leg) Wound Laterality: Left, Lateral Cleanser Soap and Water Discharge Instruction: May shower and wash wound with dial antibacterial soap and water prior to dressing change. Peri-Wound Care Triamcinolone 15 (g) Discharge Instruction: Mixed with lotion in clinic only. Use triamcinolone 15  (g) as directed Sween Lotion (Moisturizing lotion) Discharge Instruction: Apply moisturizing lotion as directed Topical Primary Dressing KerraCel Ag Gelling Fiber Dressing, 4x5 in (silver alginate) Discharge Instruction: Apply silver alginate to wound bed as instructed Secondary Dressing Woven Gauze Sponge, Non-Sterile 4x4 in Discharge Instruction: Apply over primary dressing as directed. ABD Pad, 8x10 Discharge Instruction: Apply over primary dressing as directed. Secured With Compression Wrap FourPress (4 layer compression wrap) Discharge Instruction: Apply four layer compression as directed. Compression Stockings Add-Ons Electronic Signature(s) Signed: 10/03/2020 4:51:44 PM By: Levan Hurst RN, BSN Entered By: Levan Hurst on 10/02/2020 08:08:13 -------------------------------------------------------------------------------- Wound Assessment Details Patient Name: Date of Service: [place] 10/02/2020 7:30 A M Medical Record Number: [id number] Patient Account Number: [id number] Date of Birth/Sex: Treating RN: [birth year] (80 y.o. [name] Primary Care Provider: [name]., [name] Other Clinician: Referring Provider: Treating Provider/Extender: [name], [name] in Treatment: 51 Wound Status Wound Number: 4 Primary Venous Leg Ulcer Etiology: Wound Location: Right, Distal, Lateral Lower Leg Wound Open Wounding Event: Blister Status: Date Acquired: 09/11/2019 Comorbid Anemia, Congestive Heart Failure, Hypertension, Peripheral Weeks Of Treatment: 51 History: Venous Disease, End Stage Renal Disease Clustered Wound: No Wound Measurements Length: (cm) 1.3 Width: (cm) 1.1 Depth: (cm) 0.1 Area: (cm) 1.123 Volume: (cm) 0.112 % Reduction in Area: 87.9% % Reduction in Volume: 88% Epithelialization: Small (1-33%) Tunneling: No Undermining: No Wound Description Classification: Full Thickness Without Exposed Support  Structures Wound Margin: Flat and Intact Exudate Amount: Medium Exudate Type: Serous Exudate Color: amber Foul Odor After Cleansing: No Slough/Fibrino Yes Wound Bed Granulation Amount: Large (67-100%) Exposed Structure Granulation Quality: Pink Fascia Exposed: No Necrotic Amount: Small (  1-33%) Fat Layer (Subcutaneous Tissue) Exposed: Yes Necrotic Quality: Adherent Slough Tendon Exposed: No Muscle Exposed: No Joint Exposed: No Bone Exposed: No Treatment Notes Wound #4 (Lower Leg) Wound Laterality: Right, Lateral, Distal Cleanser Soap and Water Discharge Instruction: May shower and wash wound with dial antibacterial soap and water prior to dressing change. Peri-Wound Care Triamcinolone 15 (g) Discharge Instruction: Mixed with lotion in clinic only. Use triamcinolone 15 (g) as directed Sween Lotion (Moisturizing lotion) Discharge Instruction: Apply moisturizing lotion as directed Topical Primary Dressing KerraCel Ag Gelling Fiber Dressing, 4x5 in (silver alginate) Discharge Instruction: Apply silver alginate to wound bed as instructed Secondary Dressing Woven Gauze Sponge, Non-Sterile 4x4 in Discharge Instruction: Apply over primary dressing as directed. ABD Pad, 8x10 Discharge Instruction: Apply over primary dressing as directed. Secured With Compression Wrap FourPress (4 layer compression wrap) Discharge Instruction: Apply four layer compression as directed. Compression Stockings Add-Ons Electronic Signature(s) Signed: 10/03/2020 4:51:44 PM By: Levan Hurst RN, BSN Entered By: Levan Hurst on 10/02/2020 08:09:26 -------------------------------------------------------------------------------- [place] Details Patient Name: Date of Service: [place] 10/02/2020 7:30 A M Medical Record Number: [id number] Patient Account Number: [id number] Date of Birth/Sex: Treating RN: 1940/09/08 (80 y.o. [name] Primary Care Provider: [name]., [name] Other  Clinician: Referring Provider: Treating Provider/Extender: [name], [name] in Treatment: 32 Vital Signs Time Taken: 07:50 Temperature (F): 97.8 Height (in): 71 Pulse (bpm): 105 Weight (lbs): 198 Respiratory Rate (breaths/min): 20 Body Mass Index (BMI): 27.6 Blood Pressure (mmHg): 118/78 Reference Range: 80 - 120 mg / dl Electronic Signature(s) Signed: 10/03/2020 4:51:44 PM By: Levan Hurst RN, BSN Entered By: Levan Hurst on 10/02/2020 07:50:50

## 2020-10-09 ENCOUNTER — Encounter (HOSPITAL_BASED_OUTPATIENT_CLINIC_OR_DEPARTMENT_OTHER): Payer: Medicare Other | Admitting: Internal Medicine

## 2020-10-09 ENCOUNTER — Other Ambulatory Visit: Payer: Self-pay

## 2020-10-09 DIAGNOSIS — R0602 Shortness of breath: Secondary | ICD-10-CM | POA: Diagnosis not present

## 2020-10-09 DIAGNOSIS — I130 Hypertensive heart and chronic kidney disease with heart failure and stage 1 through stage 4 chronic kidney disease, or unspecified chronic kidney disease: Secondary | ICD-10-CM | POA: Diagnosis not present

## 2020-10-09 NOTE — Progress Notes (Signed)
ZAYLYN, BERGDOLL ([id number]) Visit Report for 10/09/2020 HPI Details Patient Name: Date of Service: [place] 10/09/2020 8:00 [place] Record Number: [id number] Patient Account Number: [id number] Date of Birth/Sex: Treating RN: 1940-10-18 (80 y.o. [name], [name] Primary Care Provider: [name]., [name] Other Clinician: Referring Provider: Treating Provider/Extender: [name], [name] in Treatment: 35 History of Present Illness HPI Description: ADMISSION 10/11/2019 This is a 80 year old man with a history of a severe cardiomyopathy with an ejection fraction of about 20%, chronic renal failure stage III. He is listed as a type II diabetic in epic although the patient denies this. He also has a history of PVD. He states for the last month he has had wounds on his bilateral lower extremities that started off as blisters which denuded. He has areas on the left lateral calf and 2 on the right lateral. He has an area on the left first met head which he did not know was there he we identified this on intake. He has been using Silvadene cream provided by his primary care physician but he is complaining that this burns. 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 He was acutely ill at that time complicating GI bleed. ABIs in our clinic were 1.16 on the right and 1.13 on the left 2/25; the patient comes in with his areas on the left lateral and right lateral calf. There is also an area over the left first MTP bunion deformity. We have been using Sorbact. His edema control is fairly good 3/4; left lateral and  right lateral calf. Most of his wounds are in the same position tightly adherent nonviable debris. On the right we debrided the superior wound on the left both wounds. The area on his bunion over the left first MTP medially is I think just about closed. We have been using Sorbact without a lot of success changed to Iodoflex under compression 3/11; left lateral and right lateral calf perhaps minor improvement in the surface condition. We have been using Iodoflex. The area over the bunion of the left first MTP has closed over 3/18; left lateral and right lateral calf not much improvement. We have been using Iodoflex. Aggressive debridement last week. 3/25; left lateral and right lateral calf. We have been using Iodoflex. There is some improvement in the superior area on the right and 2 on the lateral left although there is still a lot of debris on the surface. The inferior area on the right is still a completely nonviable surface. 4/8; left lateral and right lateral calfs. Essentially mirror-image looking wounds 2 wounds on each side in close juxtaposition we have been using Iodoflex with some improvement in the very adherent fibrinous debris but not a lot. The patient has arterial studies next Wednesday morning and venous studies next Thursday morning. I have been avoiding any further aggressive debridement until we see the arterial study results. His ABIs were fairly good in this clinic and is dorsalis pedis pulses are palpable but the wound beds are pale. 4/15; left lateral and right lateral calfs. Essentially mirror-image wounds with 2 wounds on each side in close juxtaposition  but with rims of separating normal tissue. We have been using Iodoflex. The base of the wounds has been cleaning up quite nicely ARTERIAL STUDIES were done showing the patient had an ABI on the right of 1.27 with triphasic waveforms and a TBI of 0.90 on the left triphasic waveforms with an ABI of 1.28 and a TBI of 0.87. No  evidence of arterial disease VENOUS REFLUX STUDIES; were done yesterday we do not have this report yet. 4/23; VENOUS REFLUX STUDIES did not show any significant reflux right lower extremity. No evidence of a DVT He did have significant reflux in the common . femoral vein on the left but nothing else was listed as significant. He did not have a DVT. We have been using Iodoflex under compression his wounds are making progress. 4/29; improvements in the wound surface continue. We have been using Iodoflex. He has a 20% out-of-pocket co-pay for Apligraf which is unfortunate. We changed him to Sorbact today 5/6; started on Sorbact last week. Better looking wound surface but not much change in dimensions. 01/24/20-Patient is back at 3 weeks, wound surfaces are about the same but very minimal slough on the right leg wounds, however there is blistering on both legs adjacent to the wounds, patient denies any other symptoms or new symptoms. His studies have been reviewed and do not indicate any significant arterial or venous disease. But he is attending once in 3 weeks with home health changing in between 6/3; patient has 4 wounds 2 on each side of his lateral lower legs. These wounds are somewhat improved. He apparently arrived in clinic last week with a large blister on the right lateral lower leg that had denuded into the wound. They changed to silver alginate. Still under compression. He has straight Medicare and unfortunately has an unlimited co-pay for advanced treatment options. 6/10; his original 4 wounds are all better especially on the left lateral lower leg. 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 6/24; blister on the right lateral lower leg has healed. There is no additional wounds. The traumatic left second toe is also closed. The 4 that he has are all better 7/1; all of the patient's wounds are smaller except for the proximal one on the left lateral calf. We are using silver collagen 7/22; patient has home health. Relatively refractory wounds on the lateral part of both mid calfs. Each of them 2 wounds. The areas on the left are doing much better in fact the distal 1 looks like it is on his way to closure. The right required debridement with adherent fibrinous debris under illumination. We have been using silver collagen. Previous arterial studies were within normal limits he also had venous reflux studies 04/03/2020 upon evaluation today patient appears to be doing excellent with regard to his lower extremity ulcers. He is going to require a little bit of debridement on the wounds on the right especially in order to clear away some biofilm and slough but this is minimal and overall he seems to be doing quite well. 8/19; the patient's wounds on the left lateral lower leg just above closed. However the 2  on the lateral part of the right have a nonviable necrotic surface. This is disappointing. There is also no change in dimensions. He has had arterial studies as well as venous reflux studies 8/26; very disappointing today. Even the more superficial areas on the left are about the same as last week. Still 2 punched-out areas completely unchanged on the right. We have been using silver collagen really making no progress. 9/2; changed to Iodoflex last week better looking wound surfaces especially on the right although they are deep and punched out. The areas on the left are more superficial open 1 of these is almost fully epithelialized although it has been this way for at least 2 weeks. He has a 20% co-pay [Medicare only] unaffordable for a skin  substitute. Had some thought about a snap VAC on the deep areas on the right leg we will try to put this through in the insurance 9/9; punched-out wounds on the bilateral lateral lower extremities. We have been operating as if the these were chronic venous wounds with secondary lymphedema. The areas on the left have been doing well in fact one of them is closed over. We have had no improvement in the areas on the right we have been using Iodoflex to help with surface debridement. The areas on the right are deeper wider. I do not see any evidence of infection. Home health is not been putting the wraps on high enough he has localized lymphedema right above the wounds. He has had arterial and venous studies already 9/16; punched-out mirror-image wounds on his bilateral lateral mid calfs. We have 1 closed on the left lateral the other smaller. For the first time today the areas on the right lateral actually looks some better. I did a biopsy of 1 of these last time although we still do not have this result. HOWEVER he arrives in clinic today complaining of chest pain overnight which felt like a brick on his chest. He also had 2 black watery bowel movements. He does not have nausea or vomiting. He is not describing abdominal pain. He has no prior history of diarrhea heartburn etc. 9/30; sent this patient to the ER on 9/16. 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 He is on twice daily PPIs Protonix. His admission hemoglobin was exceptionally low at 5. 8.. Discharged at 8.1. 4 unit transfusion. 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 His diuretic dose was actually decreased because of elevated creatinine currently at torsemide 20 mg not felt to be a Coumadin candidate. Patient states he feels well. The areas on his left leg are healed. The areas on the right look better. These have been very refractory  wounds. He does not have stockings we will make arrangements for this today 10/7; the areas on the left leg are healed we will transition him into his 20/30 stocking today. Continued improvement in the areas on the right lateral leg 10/21; the areas on the left leg remain healed at this 2-week follow-up however there is a history that he developed some drainage from one of the wound sites with home health therefore going ahead and wrapping him. He was supposed to be on 20/30 stockings although the history here is vague and I am not sure that this represents a stocking failure or not. We have been wrapping his right leg with the one remaining wound is the distal wound. We  have been using silver collagen 10/28 the patient only has 1 wound remaining on the right lower calf. This still has 3 mm of depth which is unchanged but the surface area is smaller. The superior wound on the right lateral is closed. The 2 wounds on the left remain closed and he is using his compression stocking 11/4 1 remaining wound on the right lower calf. Under illumination not a viable surface we have been using silver collagen we had good effect on the other areas but this 1 appears to be stalled. There is no open area on the left leg he is using his own stocking. 11/11; 1 remaining wound on the right lower calf. Surface of this is a lot better than last week but still requiring debridement I am using Iodoflex but looking forward to changing the primary dressing to perhaps endoform 11/18; 1 remaining wound on the right lower leg, venous insufficiency, vigorous debridement last week/Iodoflex. Depth of the wound is therefore larger but the wound looks clean. Still very gritty material at the surface requiring debridement 12/2; the remaining wound on his right lower leg, venous insufficiency. I changed him to endoform 2 weeks ago. We appear to be making nice progress. Wound is measuring smaller 12/16; he comes in today with one of  his two wounds on the left opened infected second area on the left may be threatened as well as the healed one on the right. He has edema in both legs which I think is pitting. He says his weights are stable he has unknown fairly severe cardiomyopathy. His cardiologist is Dr. Brigitte Pulse it does not sound like he is really been carefully followed 12/23; the areas that I was concerned about on the left look a lot better we put some silver alginate and put him back in compression. It may be the compression that actually does the trick here. The remaining open area we have been using Iodoflex and again this looks a lot better. At our suggestion he went to see his primary who did not adjust his diuretic he is still apparently taking 20 mg of a loop diuretic and "sometimes" 40 mg is basically what the patient stated 1/6; the area on the left is closed once again. We will transition him into his 20/30 stocking from elastic therapy both areas on the right are now open which is a deterioration from last time at which time the only 1 was open but they are very superficial. We have been using Hydrofera Blue under compression 1/27; its been 3 weeks since the patient was here. We transitioned him to a 20/30 stocking on the left leg. Still under 4-layer compression on the right. He comes in today with a new small opening on the left leg but reopening on the right. Massive blistering on the right leg. He has severe systemic fluid volume overload. I have looked over the patient's records. He was seen in 2020 by tele health by cardiology. 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 I think he was supposed to have follow-up but that follow-up never seems to have happened. He does not complain of chest pain or shortness of breath but notes increasing edema not just in his lower legs 2/3; he has new open areas on the bilateral anterior lower legs. I think a lot of this is  because of systemic fluid volume overload. The ultrasound I ordered showed ascites and suggestion of cirrhosis. His physical exam  continues to suggest right greater than left congestive heart failure. I have him taking 40 mg of Lasix oral daily I have asked him to start weighing himself daily. With the intense efforts of our nursing staff we have him a cardiology appointment on Monday or Tuesday of next week I have told him not to miss this. With regards to his wounds I do not think we are going to be able to stop wrapping him until somebody manages to get his fluid volume down 2/10; the areas on his bilateral lateral lower legs actually look better today. He has been taking Lasix 40 mg a day I believe [2 x 20 mg]. He seems to have less edema in his legs and and is a result of this I think he has less edema in his lower legs as well and the wounds appear to be doing better. He comes in today with purulence coming out of the toenail bed of the left great toe it appears that his toenail came off there was noted to be pus I did a culture of this area Electronic Signature(s) Signed: 10/09/2020 5:13:40 PM By: Linton Ham MD Entered By: Linton Ham on 10/09/2020 09:06:18 -------------------------------------------------------------------------------- Physical Exam Details Patient Name: Date of Service: [place] 10/09/2020 8:00 [place] Record Number: [id number] Patient Account Number: [id number] Date of Birth/Sex: Treating RN: 10-04-1940 (80 y.o. [name], [name] Primary Care Provider: [name]., [name] Other Clinician: Referring Provider: Treating Provider/Extender: [name], [name] in Treatment: 33 Constitutional Sitting or standing Blood Pressure is within target range for patient.. Pulse regular and within target range for patient.Marland Kitchen Respirations regular, non-labored and within target range.. Temperature is normal and within the target range for  the patient.Marland Kitchen Appears in no distress. Cardiovascular Pedal pulses are palpable. We have excellent edema control. Notes Wound exam; his wounds are better certainly less dimension less depth. He has a new area on the nailbed of the first toe. Purulence tenderness. He is not able to exactly tell me when his toenail came off. Electronic Signature(s) Signed: 10/09/2020 5:13:40 PM By: Linton Ham MD Entered By: Linton Ham on 10/09/2020 09:07:14 -------------------------------------------------------------------------------- Physician Orders Details Patient Name: Date of Service: [place] 10/09/2020 8:00 [place] Record Number: [id number] Patient Account Number: [id number] Date of Birth/Sex: Treating RN: September 03, 1940 (80 y.o. 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 Bathing/ Shower/ Hygiene May shower with protection but do not get wound dressing(s) wet. May shower and wash wound with soap and water. - with dressing changes only. Edema Control - Lymphedema / SCD / Other Elevate legs to the level of the heart or above for 30 minutes daily and/or when sitting, a frequency of: - 3-4 times a day throughout the day. Avoid standing for long periods of time. Exercise regularly Additional Orders / Instructions Other: - patient to weigh daily,  write it down, and take to cardiology.  Patient to call [facility] on [facility]. to see Cardiology next Wednesday. ***Pick up PolySporin ointment for left great toe and oral antibiotics at pharmacy today.*** Home Health New wound care orders this week; continue Home Health for wound care. May utilize formulary equivalent dressing for wound treatment orders unless otherwise specified. - [facility] home [facility]. Wound Treatment Wound #11 - Lower Leg Wound Laterality: Right, Lateral, Proximal Cleanser: Soap and Water (Home Health) 2 x Per Week/30 Days Discharge Instructions: May shower and wash wound with dial antibacterial soap and water prior to dressing change. Peri-Wound Care: Sween Lotion (Moisturizing lotion) (Home Health) 2 x Per Week/30 Days Discharge Instructions: Apply moisturizing lotion as directed Peri-Wound Care: Triamcinolone 15 (g) 2 x Per Week/30 Days Discharge Instructions: Mixed with lotion in clinic only. Use triamcinolone 15 (g) as directed Prim Dressing: KerraCel Ag Gelling Fiber Dressing, 4x5 in (silver alginate) (Home Health) 2 x Per Week/30 Days ary Discharge Instructions: Apply silver alginate to wound bed as instructed Secondary Dressing: Woven Gauze Sponge, Non-Sterile 4x4 in (Home Health) 2 x Per Week/30 Days Discharge Instructions: Apply over primary dressing as directed. Secondary Dressing: ABD Pad, 8x10 (Home Health) 2 x Per Week/30 Days Discharge Instructions: Apply over primary dressing as directed. Compression Wrap: FourPress (4 layer compression wrap) (Home Health) 2 x Per Week/30 Days Discharge Instructions: Apply four layer compression as directed. Wound #12 - Lower Leg Wound Laterality: Left, Anterior Cleanser: Soap and Water (Home Health) 2 x Per Week/30 Days Discharge Instructions: May shower and wash wound with dial antibacterial soap and water prior to dressing change. Peri-Wound Care: Sween Lotion (Moisturizing lotion) (Home Health) 2 x  Per Week/30 Days Discharge Instructions: Apply moisturizing lotion as directed Peri-Wound Care: Triamcinolone 15 (g) 2 x Per Week/30 Days Discharge Instructions: Mixed with lotion in clinic only. Use triamcinolone 15 (g) as directed Prim Dressing: KerraCel Ag Gelling Fiber Dressing, 4x5 in (silver alginate) (Home Health) 2 x Per Week/30 Days ary Discharge Instructions: Apply silver alginate to wound bed as instructed Secondary Dressing: Woven Gauze Sponge, Non-Sterile 4x4 in (Home Health) 2 x Per Week/30 Days Discharge Instructions: Apply over primary dressing as directed. Secondary Dressing: ABD Pad, 8x10 (Home Health) 2 x Per Week/30 Days Discharge Instructions: Apply over primary dressing as directed. Compression Wrap: FourPress (4 layer compression wrap) (Home Health) 2 x Per Week/30 Days Discharge Instructions: Apply four layer compression as directed. Wound #13 - Lower Leg Wound Laterality: Right, Anterior Cleanser: Soap and Water (Home Health) 2 x Per Week/30 Days Discharge Instructions: May shower and wash wound with dial antibacterial soap and water prior to dressing change. Peri-Wound Care: Sween Lotion (Moisturizing lotion) (Home Health) 2 x Per Week/30 Days Discharge Instructions: Apply moisturizing lotion as directed Peri-Wound Care: Triamcinolone 15 (g) 2 x Per Week/30 Days Discharge Instructions: Mixed with lotion in clinic only. Use triamcinolone 15 (g) as directed Prim Dressing: KerraCel Ag Gelling Fiber Dressing, 4x5 in (silver alginate) (Home Health) 2 x Per Week/30 Days ary Discharge Instructions: Apply silver alginate to wound bed as instructed Secondary Dressing: Woven Gauze Sponge, Non-Sterile 4x4 in (Home Health) 2 x Per Week/30 Days Discharge Instructions: Apply over primary dressing as directed. Secondary Dressing: ABD Pad, 8x10 (Home Health) 2 x Per Week/30 Days Discharge Instructions: Apply over primary dressing as directed. Compression Wrap: FourPress (4 layer  compression wrap) (Home Health) 2 x Per Week/30 Days Discharge Instructions: Apply four layer compression as directed. Wound #14 - Lower Leg Wound Laterality: Left, Lateral Cleanser: Soap  and Water [facility]) 2 x Per Week/30 Days Discharge Instructions: May shower and wash wound with dial antibacterial soap and water prior to dressing change. Peri-Wound Care: Sween Lotion (Moisturizing lotion) (Home Health) 2 x Per Week/30 Days Discharge Instructions: Apply moisturizing lotion as directed Peri-Wound Care: Triamcinolone 15 (g) 2 x Per Week/30 Days Discharge Instructions: Mixed with lotion in clinic only. Use triamcinolone 15 (g) as directed Prim Dressing: KerraCel Ag Gelling Fiber Dressing, 4x5 in (silver alginate) (Home Health) 2 x Per Week/30 Days ary Discharge Instructions: Apply silver alginate to wound bed as instructed Secondary Dressing: Woven Gauze Sponge, Non-Sterile 4x4 in (Home Health) 2 x Per Week/30 Days Discharge Instructions: Apply over primary dressing as directed. Secondary Dressing: ABD Pad, 8x10 (Home Health) 2 x Per Week/30 Days Discharge Instructions: Apply over primary dressing as directed. Compression Wrap: FourPress (4 layer compression wrap) (Home Health) 2 x Per Week/30 Days Discharge Instructions: Apply four layer compression as directed. Wound #15 - T Great oe Wound Laterality: Left Cleanser: Soap and Water (Home Health) 2 x Per Day/15 Days Discharge Instructions: May shower and wash wound with dial antibacterial soap and water prior to dressing change. Topical: Mupirocin Ointment 2 x Per Day/15 Days Discharge Instructions: ***In Clinic only***. Apply Mupirocin (Bactroban) under Alignate Ag. Topical: PolySporin (Home Health) 2 x Per Day/15 Days Discharge Instructions: patient to purchase and home health to apply under the alginate Ag, Prim Dressing: KerraCel Ag Gelling Fiber Dressing, 2x2 in (silver alginate) ([place]) 2 x Per Day/15 Days ary Discharge  Instructions: Apply silver alginate to wound bed as instructed Secondary Dressing: Woven Gauze Sponges 2x2 in ([place]) 2 x Per Day/15 Days Discharge Instructions: Apply over primary dressing as directed. Secured With: Child psychotherapist, Sterile 2x75 (in/in) (Home Health) 2 x Per Day/15 Days Discharge Instructions: Secure with stretch gauze as directed. Wound #4 - Lower Leg Wound Laterality: Right, Lateral, Distal Cleanser: Soap and Water (Home Health) 2 x Per Week/30 Days Discharge Instructions: May shower and wash wound with dial antibacterial soap and water prior to dressing change. Peri-Wound Care: Sween Lotion (Moisturizing lotion) (Home Health) 2 x Per Week/30 Days Discharge Instructions: Apply moisturizing lotion as directed Peri-Wound Care: Triamcinolone 15 (g) 2 x Per Week/30 Days Discharge Instructions: Mixed with lotion in clinic only. Use triamcinolone 15 (g) as directed Prim Dressing: KerraCel Ag Gelling Fiber Dressing, 4x5 in (silver alginate) (Home Health) 2 x Per Week/30 Days ary Discharge Instructions: Apply silver alginate to wound bed as instructed Secondary Dressing: Woven Gauze Sponge, Non-Sterile 4x4 in (Home Health) 2 x Per Week/30 Days Discharge Instructions: Apply over primary dressing as directed. Secondary Dressing: ABD Pad, 8x10 (Home Health) 2 x Per Week/30 Days Discharge Instructions: Apply over primary dressing as directed. Compression Wrap: FourPress (4 layer compression wrap) (Home Health) 2 x Per Week/30 Days Discharge Instructions: Apply four layer compression as directed. Laboratory erobe culture (MICRO) - culture of left great toe. Bacteria identified in Unspecified specimen by A LOINC Code: 466-5 Convenience Name: Areobic culture-specimen not specified Electronic Signature(s) Signed: 10/09/2020 5:13:40 PM By: Linton Ham MD Signed: 10/09/2020 6:08:01 PM By: Deon Pilling Entered By: Deon Pilling on 10/09/2020  08:44:31 -------------------------------------------------------------------------------- Problem List Details Patient Name: Date of Service: [place] 10/09/2020 8:00 [place] Record Number: [id number] Patient Account Number: [id number] Date of Birth/Sex: Treating RN: January 31, 1941 (80 y.o. 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/06/2020 No Yes L97.812 Non-pressure chronic ulcer of other part of right lower leg with fat layer 01/03/2020 No Yes exposed E42.35 Chronic systolic (congestive) heart failure 10/02/2020 No Yes S90.812D Abrasion, left foot, subsequent encounter 10/09/2020 No Yes Inactive Problems ICD-10 Code Description Active Date Inactive Date L97.521 Non-pressure chronic ulcer of other part of left foot limited to breakdown of skin 10/11/2019 10/11/2019 S90.812D Abrasion, left foot, subsequent encounter 02/07/2020 02/07/2020 I42.9 Cardiomyopathy, unspecified 05/15/2020 05/15/2020 K92.1 Melena 05/15/2020 05/15/2020 Resolved Problems ICD-10 Code Description Active Date Resolved Date 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 10/11/2019 10/11/2019 Electronic Signature(s) Signed: 10/09/2020 5:13:40 PM By: Linton Ham MD Entered By: Linton Ham on 10/09/2020 09:33:36 -------------------------------------------------------------------------------- Progress Note Details Patient Name: Date of Service: [place] 10/09/2020 8:00 [place] Record  Number: [id number] Patient Account Number: [id number] Date of Birth/Sex: Treating RN: July 27, 1941 (80 y.o. [name], [name] Primary Care Provider: [name]., [name] Other Clinician: Referring Provider: Treating Provider/Extender: [name], [name] in Treatment: 25 Subjective History of Present Illness (HPI) ADMISSION 10/11/2019 This is a 80 year old man with a history of a severe cardiomyopathy with an ejection fraction of about 20%, chronic renal failure stage III. He is listed as a type II diabetic in epic although the patient denies this. He also has a history of PVD. He states for the last month he has had wounds on his bilateral lower extremities that started off as blisters which denuded. He has areas on the left lateral calf and 2 on the right lateral. He has an area on the left first met head which he did not know was there he we identified this on intake. He has been using Silvadene cream provided by his primary care physician but he is complaining that this burns. 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 He was acutely ill at that time complicating GI bleed. ABIs in our clinic were 1.16 on the right and 1.13 on the left 2/25; the patient comes in with his areas on the left lateral and right lateral calf. There is also an area over the left first MTP bunion deformity. We have been using Sorbact. His edema control is fairly good 3/4; left lateral and right lateral calf. Most of his wounds are in the same position tightly adherent nonviable debris. On the right we debrided the superior wound on the left  both wounds. The area on his bunion over the left first MTP medially is I think just about closed. We have been using Sorbact without a lot of success changed to Iodoflex under compression 3/11; left lateral and right lateral calf perhaps minor improvement in the surface condition. We have been using Iodoflex. The area over the bunion of the left first MTP has closed over 3/18; left lateral and right  lateral calf not much improvement. We have been using Iodoflex. Aggressive debridement last week. 3/25; left lateral and right lateral calf. We have been using Iodoflex. There is some improvement in the superior area on the right and 2 on the lateral left although there is still a lot of debris on the surface. The inferior area on the right is still a completely nonviable surface. 4/8; left lateral and right lateral calfs. Essentially mirror-image looking wounds 2 wounds on each side in close juxtaposition we have been using Iodoflex with some improvement in the very adherent fibrinous debris but not a lot. The patient has arterial studies next Wednesday morning and venous studies next Thursday morning. I have been avoiding any further aggressive debridement until we see the arterial study results. His ABIs were fairly good in this clinic and is dorsalis pedis pulses are palpable but the wound beds are pale. 4/15; left lateral and right lateral calfs. Essentially mirror-image wounds with 2 wounds on each side in close juxtaposition but with rims of separating normal tissue. We have been using Iodoflex. The base of the wounds has been cleaning up quite nicely ARTERIAL STUDIES were done showing the patient had an ABI on the right of 1.27 with triphasic waveforms and a TBI of 0.90 on the left triphasic waveforms with an ABI of 1.28 and a TBI of 0.87. No evidence of arterial disease VENOUS REFLUX STUDIES; were done yesterday we do not have this report yet. 4/23; VENOUS REFLUX STUDIES did not show any  significant reflux right lower extremity. No evidence of a DVT He did have significant reflux in the common . femoral vein on the left but nothing else was listed as significant. He did not have a DVT. We have been using Iodoflex under compression his wounds are making progress. 4/29; improvements in the wound surface continue. We have been using Iodoflex. He has a 20% out-of-pocket co-pay for Apligraf which is unfortunate. We changed him to Sorbact today 5/6; started on Sorbact last week. Better looking wound surface but not much change in dimensions. 01/24/20-Patient is back at 3 weeks, wound surfaces are about the same but very minimal slough on the right leg wounds, however there is blistering on both legs adjacent to the wounds, patient denies any other symptoms or new symptoms. His studies have been reviewed and do not indicate any significant arterial or venous disease. But he is attending once in 3 weeks with home health changing in between 6/3; patient has 4 wounds 2 on each side of his lateral lower legs. These wounds are somewhat improved. He apparently arrived in clinic last week with a large blister on the right lateral lower leg that had denuded into the wound. They changed to silver alginate. Still under compression. He has straight Medicare and unfortunately has an unlimited co-pay for advanced treatment options. 6/10; his original 4 wounds are all better especially on the left lateral lower leg. 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 6/24; blister on the right lateral lower leg has healed. There is no additional wounds. The traumatic left  second toe is also closed. The 4 that he  has are all better 7/1; all of the patient's wounds are smaller except for the proximal one on the left lateral calf. We are using silver collagen 7/22; patient has home health. Relatively refractory wounds on the lateral part of both mid calfs. Each of them 2 wounds. The areas on the left are doing much better in fact the distal 1 looks like it is on his way to closure. The right required debridement with adherent fibrinous debris under illumination. We have been using silver collagen. Previous arterial studies were within normal limits he also had venous reflux studies 04/03/2020 upon evaluation today patient appears to be doing excellent with regard to his lower extremity ulcers. He is going to require a little bit of debridement on the wounds on the right especially in order to clear away some biofilm and slough but this is minimal and overall he seems to be doing quite well. 8/19; the patient's wounds on the left lateral lower leg just above closed. However the 2 on the lateral part of the right have a nonviable necrotic surface. This is disappointing. There is also no change in dimensions. He has had arterial studies as well as venous reflux studies 8/26; very disappointing today. Even the more superficial areas on the left are about the same as last week. Still 2 punched-out areas completely unchanged on the right. We have been using silver collagen really making no progress. 9/2; changed to Iodoflex last week better looking wound surfaces especially on the right although they are deep and punched out. The areas on the left are more superficial open 1 of these is almost fully epithelialized although it has been this way for at least 2 weeks. He has a 20% co-pay [Medicare only] unaffordable for a skin substitute. Had some thought about a snap VAC on the deep areas on the right leg we will try to put this through in the insurance 9/9; punched-out wounds on  the bilateral lateral lower extremities. We have been operating as if the these were chronic venous wounds with secondary lymphedema. The areas on the left have been doing well in fact one of them is closed over. We have had no improvement in the areas on the right we have been using Iodoflex to help with surface debridement. The areas on the right are deeper wider. I do not see any evidence of infection. Home health is not been putting the wraps on high enough he has localized lymphedema right above the wounds. He has had arterial and venous studies already 9/16; punched-out mirror-image wounds on his bilateral lateral mid calfs. We have 1 closed on the left lateral the other smaller. For the first time today the areas on the right lateral actually looks some better. I did a biopsy of 1 of these last time although we still do not have this result. HOWEVER he arrives in clinic today complaining of chest pain overnight which felt like a brick on his chest. He also had 2 black watery bowel movements. He does not have nausea or vomiting. He is not describing abdominal pain. He has no prior history of diarrhea heartburn etc. 9/30; sent this patient to the ER on 9/16. 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 He is on twice daily PPIs Protonix. His admission hemoglobin was exceptionally low at 5. 8.. Discharged at 8.1. 4 unit transfusion. He was also felt to have congestive heart failure with elevated BNP's he has an EF less than 20 with left ventricular  thrombus. His diuretic dose was actually decreased because of elevated creatinine currently at torsemide 20 mg not felt to be a Coumadin candidate. Patient states he feels well. The areas on his left leg are healed. The areas on the right look better. These have been very refractory wounds. He does not have stockings we will make arrangements for this today 10/7; the areas on the left leg are healed we will transition him into his 20/30  stocking today. Continued improvement in the areas on the right lateral leg 10/21; the areas on the left leg remain healed at this 2-week follow-up however there is a history that he developed some drainage from one of the wound sites with home health therefore going ahead and wrapping him. He was supposed to be on 20/30 stockings although the history here is vague and I am not sure that this represents a stocking failure or not. We have been wrapping his right leg with the one remaining wound is the distal wound. We have been using silver collagen 10/28 the patient only has 1 wound remaining on the right lower calf. This still has 3 mm of depth which is unchanged but the surface area is smaller. The superior wound on the right lateral is closed. The 2 wounds on the left remain closed and he is using his compression stocking 11/4 1 remaining wound on the right lower calf. Under illumination not a viable surface we have been using silver collagen we had good effect on the other areas but this 1 appears to be stalled. There is no open area on the left leg he is using his own stocking. 11/11; 1 remaining wound on the right lower calf. Surface of this is a lot better than last week but still requiring debridement I am using Iodoflex but looking forward to changing the primary dressing to perhaps endoform 11/18; 1 remaining wound on the right lower leg, venous insufficiency, vigorous debridement last week/Iodoflex. Depth of the wound is therefore larger but the wound looks clean. Still very gritty material at the surface requiring debridement 12/2; the remaining wound on his right lower leg, venous insufficiency. I changed him to endoform 2 weeks ago. We appear to be making nice progress. Wound is measuring smaller 12/16; he comes in today with one of his two wounds on the left opened infected second area on the left may be threatened as well as the healed one on the right. He has edema in both legs which  I think is pitting. He says his weights are stable he has unknown fairly severe cardiomyopathy. His cardiologist is Dr. Brigitte Pulse it does not sound like he is really been carefully followed 12/23; the areas that I was concerned about on the left look a lot better we put some silver alginate and put him back in compression. It may be the compression that actually does the trick here. The remaining open area we have been using Iodoflex and again this looks a lot better. At our suggestion he went to see his primary who did not adjust his diuretic he is still apparently taking 20 mg of a loop diuretic and "sometimes" 40 mg is basically what the patient stated 1/6; the area on the left is closed once again. We will transition him into his 20/30 stocking from elastic therapy both areas on the right are now open which is a deterioration from last time at which time the only 1 was open but they are very superficial. We have been using  Hydrofera Blue under compression 1/27; its been 3 weeks since the patient was here. We transitioned him to a 20/30 stocking on the left leg. Still under 4-layer compression on the right. He comes in today with a new small opening on the left leg but reopening on the right. Massive blistering on the right leg. He has severe systemic fluid volume overload. I have looked over the patient's records. He was seen in 2020 by tele health by cardiology. 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 I think he was supposed to have follow-up but that follow-up never seems to have happened. He does not complain of chest pain or shortness of breath but notes increasing edema not just in his lower legs 2/3; he has new open areas on the bilateral anterior lower legs. I think a lot of this is because of systemic fluid volume overload. The ultrasound I ordered showed ascites and suggestion of cirrhosis. His physical exam continues to suggest right  greater than left congestive heart failure. I have him taking 40 mg of Lasix oral daily I have asked him to start weighing himself daily. With the intense efforts of our nursing staff we have him a cardiology appointment on Monday or Tuesday of next week I have told him not to miss this. With regards to his wounds I do not think we are going to be able to stop wrapping him until somebody manages to get his fluid volume down 2/10; the areas on his bilateral lateral lower legs actually look better today. He has been taking Lasix 40 mg a day I believe [2 x 20 mg]. He seems to have less edema in his legs and and is a result of this I think he has less edema in his lower legs as well and the wounds appear to be doing better. He comes in today with purulence coming out of the toenail bed of the left great toe it appears that his toenail came off there was noted to be pus I did a culture of this area Objective Constitutional Sitting or standing Blood Pressure is within target range for patient.. Pulse regular and within target range for patient.Marland Kitchen Respirations regular, non-labored and within target range.. Temperature is normal and within the target range for the patient.Marland Kitchen Appears in no distress. Vitals Time Taken: 7:56 AM, Height: 71 in, Weight: 198 lbs, BMI: 27.6, Temperature: 97.6 F, Pulse: 103 bpm, Respiratory Rate: 22 breaths/min, Blood Pressure: 128/79 mmHg. Cardiovascular Pedal pulses are palpable. We have excellent edema control. General Notes: Wound exam; his wounds are better certainly less dimension less depth. He has a new area on the nailbed of the first toe. Purulence tenderness. He is not able to exactly tell me when his toenail came off. Integumentary (Hair, Skin) Wound #11 status is Open. Original cause of wound was Gradually Appeared. The wound is located on the Right,Proximal,Lateral Lower Leg. The wound measures 0.7cm length x 0.6cm width x 0.1cm depth; 0.33cm^2 area and 0.033cm^3  volume. There is Fat Layer (Subcutaneous Tissue) exposed. There is no tunneling or undermining noted. There is a small amount of serosanguineous drainage noted. The wound margin is flat and intact. There is large (67-100%) pink granulation within the wound bed. There is a small (1-33%) amount of necrotic tissue within the wound bed including Adherent Slough. Wound #12 status is Open. Original cause of wound was Gradually Appeared. The wound is located on the Left,Anterior Lower Leg. The wound measures  0.4cm length x 0.2cm width x 0.1cm depth; 0.063cm^2 area and 0.006cm^3 volume. There is Fat Layer (Subcutaneous Tissue) exposed. There is no tunneling or undermining noted. There is a small amount of serosanguineous drainage noted. The wound margin is distinct with the outline attached to the wound base. There is large (67-100%) pink granulation within the wound bed. There is no necrotic tissue within the wound bed. Wound #13 status is Open. Original cause of wound was Blister. The wound is located on the Right,Anterior Lower Leg. The wound measures 0.4cm length x 0.6cm width x 0.1cm depth; 0.188cm^2 area and 0.019cm^3 volume. There is Fat Layer (Subcutaneous Tissue) exposed. There is no tunneling or undermining noted. There is a small amount of serosanguineous drainage noted. The wound margin is flat and intact. There is large (67-100%) pink granulation within the wound bed. There is no necrotic tissue within the wound bed. Wound #14 status is Open. Original cause of wound was Blister. The wound is located on the Left,Lateral Lower Leg. The wound measures 1.2cm length x 1cm width x 0.1cm depth; 0.942cm^2 area and 0.094cm^3 volume. There is Fat Layer (Subcutaneous Tissue) exposed. There is no tunneling or undermining noted. There is a medium amount of serosanguineous drainage noted. The wound margin is flat and intact. There is small (1-33%) pink granulation within the wound bed. There is a large  (67-100%) amount of necrotic tissue within the wound bed including Adherent Slough. Wound #15 status is Open. Original cause of wound was Gradually Appeared. The wound is located on the Left T Great. The wound measures 1cm length x oe 2cm width x 0.1cm depth; 1.571cm^2 area and 0.157cm^3 volume. There is Fat Layer (Subcutaneous Tissue) exposed. There is no tunneling or undermining noted. There is a medium amount of purulent drainage noted. The wound margin is indistinct and nonvisible. There is small (1-33%) pink granulation within the wound bed. There is a large (67-100%) amount of necrotic tissue within the wound bed including Adherent Slough. Wound #4 status is Open. Original cause of wound was Blister. The wound is located on the Right,Distal,Lateral Lower Leg. The wound measures 0.8cm length x 0.3cm width x 0.1cm depth; 0.188cm^2 area and 0.019cm^3 volume. There is Fat Layer (Subcutaneous Tissue) exposed. There is no tunneling or undermining noted. There is a small amount of serosanguineous drainage noted. The wound margin is flat and intact. There is large (67-100%) pink granulation within the wound bed. There is no necrotic tissue within the wound bed. Assessment Active Problems ICD-10 Chronic venous hypertension (idiopathic) with inflammation of bilateral lower extremity Lymphedema, not elsewhere classified Non-pressure chronic ulcer of other part of left lower leg with fat layer exposed Non-pressure chronic ulcer of other part of right lower leg with fat layer exposed Chronic systolic (congestive) heart failure Procedures Wound #11 Pre-procedure diagnosis of Wound #11 is a Venous Leg Ulcer located on the Right,Proximal,Lateral Lower Leg . There was a Four Layer Compression Therapy Procedure by Baruch Gouty, RN. Post procedure Diagnosis Wound #11: Same as Pre-Procedure Wound #12 Pre-procedure diagnosis of Wound #12 is a Venous Leg Ulcer located on the Left,Anterior Lower Leg .  There was a Four Layer Compression Therapy Procedure by Baruch Gouty, RN. Post procedure Diagnosis Wound #12: Same as Pre-Procedure Wound #13 Pre-procedure diagnosis of Wound #13 is a Venous Leg Ulcer located on the Right,Anterior Lower Leg . There was a Four Layer Compression Therapy Procedure by Baruch Gouty, RN. Post procedure Diagnosis Wound #13: Same as Pre-Procedure Wound #14 Pre-procedure diagnosis of  Wound #14 is a Venous Leg Ulcer located on the Left,Lateral Lower Leg . There was a Four Layer Compression Therapy Procedure by Baruch Gouty, RN. Post procedure Diagnosis Wound #14: Same as Pre-Procedure Wound #4 Pre-procedure diagnosis of Wound #4 is a Venous Leg Ulcer located on the Right,Distal,Lateral Lower Leg . There was a Four Layer Compression Therapy Procedure by Baruch Gouty, RN. Post procedure Diagnosis Wound #4: Same as Pre-Procedure Plan Follow-up Appointments: Return Appointment in 1 week. Bathing/ Shower/ Hygiene: May shower with protection but do not get wound dressing(s) wet. May shower and wash wound with soap and water. - with dressing changes only. Edema Control - Lymphedema / SCD / Other: Elevate legs to the level of the heart or above for 30 minutes daily and/or when sitting, a frequency of: - 3-4 times a day throughout the day. Avoid standing for long periods of time. Exercise regularly Additional Orders / Instructions: Other: - patient to weigh daily, write it down, and take to cardiology. Patient to call [facility] on [facility]. to see Cardiology next Wednesday. ***Pick up PolySporin ointment for left great toe and oral antibiotics at pharmacy today.*** Home Health: New wound care orders this week; continue Home Health for wound care. May utilize formulary equivalent dressing for wound treatment orders unless otherwise specified. - [facility]. Laboratory ordered were: Areobic culture-specimen not specified - culture  of left great toe. WOUND #11: - Lower Leg Wound Laterality: Right, Lateral, Proximal Cleanser: Soap and Water (Home Health) 2 x Per Week/30 Days Discharge Instructions: May shower and wash wound with dial antibacterial soap and water prior to dressing change. Peri-Wound Care: Sween Lotion (Moisturizing lotion) (Home Health) 2 x Per Week/30 Days Discharge Instructions: Apply moisturizing lotion as directed Peri-Wound Care: Triamcinolone 15 (g) 2 x Per Week/30 Days Discharge Instructions: Mixed with lotion in clinic only. Use triamcinolone 15 (g) as directed Prim Dressing: KerraCel Ag Gelling Fiber Dressing, 4x5 in (silver alginate) (Home Health) 2 x Per Week/30 Days ary Discharge Instructions: Apply silver alginate to wound bed as instructed Secondary Dressing: Woven Gauze Sponge, Non-Sterile 4x4 in (Home Health) 2 x Per Week/30 Days Discharge Instructions: Apply over primary dressing as directed. Secondary Dressing: ABD Pad, 8x10 (Home Health) 2 x Per Week/30 Days Discharge Instructions: Apply over primary dressing as directed. Com pression Wrap: FourPress (4 layer compression wrap) (Home Health) 2 x Per Week/30 Days Discharge Instructions: Apply four layer compression as directed. WOUND #12: - Lower Leg Wound Laterality: Left, Anterior Cleanser: Soap and Water (Home Health) 2 x Per Week/30 Days Discharge Instructions: May shower and wash wound with dial antibacterial soap and water prior to dressing change. Peri-Wound Care: Sween Lotion (Moisturizing lotion) (Home Health) 2 x Per Week/30 Days Discharge Instructions: Apply moisturizing lotion as directed Peri-Wound Care: Triamcinolone 15 (g) 2 x Per Week/30 Days Discharge Instructions: Mixed with lotion in clinic only. Use triamcinolone 15 (g) as directed Prim Dressing: KerraCel Ag Gelling Fiber Dressing, 4x5 in (silver alginate) (Home Health) 2 x Per Week/30 Days ary Discharge Instructions: Apply silver alginate to wound bed as  instructed Secondary Dressing: Woven Gauze Sponge, Non-Sterile 4x4 in (Home Health) 2 x Per Week/30 Days Discharge Instructions: Apply over primary dressing as directed. Secondary Dressing: ABD Pad, 8x10 (Home Health) 2 x Per Week/30 Days Discharge Instructions: Apply over primary dressing as directed. Com pression Wrap: FourPress (4 layer compression wrap) (Home Health) 2 x Per Week/30 Days Discharge Instructions: Apply four layer compression as directed. WOUND #13: -  Lower Leg Wound Laterality: Right, Anterior Cleanser: Soap and Water (Home Health) 2 x Per Week/30 Days Discharge Instructions: May shower and wash wound with dial antibacterial soap and water prior to dressing change. Peri-Wound Care: Sween Lotion (Moisturizing lotion) (Home Health) 2 x Per Week/30 Days Discharge Instructions: Apply moisturizing lotion as directed Peri-Wound Care: Triamcinolone 15 (g) 2 x Per Week/30 Days Discharge Instructions: Mixed with lotion in clinic only. Use triamcinolone 15 (g) as directed Prim Dressing: KerraCel Ag Gelling Fiber Dressing, 4x5 in (silver alginate) (Home Health) 2 x Per Week/30 Days ary Discharge Instructions: Apply silver alginate to wound bed as instructed Secondary Dressing: Woven Gauze Sponge, Non-Sterile 4x4 in (Home Health) 2 x Per Week/30 Days Discharge Instructions: Apply over primary dressing as directed. Secondary Dressing: ABD Pad, 8x10 (Home Health) 2 x Per Week/30 Days Discharge Instructions: Apply over primary dressing as directed. Com pression Wrap: FourPress (4 layer compression wrap) (Home Health) 2 x Per Week/30 Days Discharge Instructions: Apply four layer compression as directed. WOUND #14: - Lower Leg Wound Laterality: Left, Lateral Cleanser: Soap and Water (Home Health) 2 x Per Week/30 Days Discharge Instructions: May shower and wash wound with dial antibacterial soap and water prior to dressing change. Peri-Wound Care: Sween Lotion (Moisturizing lotion) (Home  Health) 2 x Per Week/30 Days Discharge Instructions: Apply moisturizing lotion as directed Peri-Wound Care: Triamcinolone 15 (g) 2 x Per Week/30 Days Discharge Instructions: Mixed with lotion in clinic only. Use triamcinolone 15 (g) as directed Prim Dressing: KerraCel Ag Gelling Fiber Dressing, 4x5 in (silver alginate) (Home Health) 2 x Per Week/30 Days ary Discharge Instructions: Apply silver alginate to wound bed as instructed Secondary Dressing: Woven Gauze Sponge, Non-Sterile 4x4 in (Home Health) 2 x Per Week/30 Days Discharge Instructions: Apply over primary dressing as directed. Secondary Dressing: ABD Pad, 8x10 (Home Health) 2 x Per Week/30 Days Discharge Instructions: Apply over primary dressing as directed. Com pression Wrap: FourPress (4 layer compression wrap) (Home Health) 2 x Per Week/30 Days Discharge Instructions: Apply four layer compression as directed. WOUND #15: - T Great Wound Laterality: Left oe Cleanser: Soap and Water (Home Health) 2 x Per KGM/01 Days Discharge Instructions: May shower and wash wound with dial antibacterial soap and water prior to dressing change. Topical: Mupirocin Ointment 2 x Per Day/15 Days Discharge Instructions: ***In Clinic only***. Apply Mupirocin (Bactroban) under Alignate Ag. Topical: PolySporin (Home Health) 2 x Per Day/15 Days Discharge Instructions: patient to purchase and home health to apply under the alginate Ag, Prim Dressing: KerraCel Ag Gelling Fiber Dressing, 2x2 in (silver alginate) ([place]) 2 x Per Day/15 Days ary Discharge Instructions: Apply silver alginate to wound bed as instructed Secondary Dressing: Woven Gauze Sponges 2x2 in ([place]) 2 x Per Day/15 Days Discharge Instructions: Apply over primary dressing as directed. Secured With: Child psychotherapist, Sterile 2x75 (in/in) (Home Health) 2 x Per Day/15 Days Discharge Instructions: Secure with stretch gauze as directed. WOUND #4: - Lower Leg Wound  Laterality: Right, Lateral, Distal Cleanser: Soap and Water (Home Health) 2 x Per Week/30 Days Discharge Instructions: May shower and wash wound with dial antibacterial soap and water prior to dressing change. Peri-Wound Care: Sween Lotion (Moisturizing lotion) (Home Health) 2 x Per Week/30 Days Discharge Instructions: Apply moisturizing lotion as directed Peri-Wound Care: Triamcinolone 15 (g) 2 x Per Week/30 Days Discharge Instructions: Mixed with lotion in clinic only. Use triamcinolone 15 (g) as directed Prim Dressing: KerraCel Ag Gelling Fiber Dressing, 4x5 in (silver alginate) (Home Health)  2 x Per Week/30 Days ary Discharge Instructions: Apply silver alginate to wound bed as instructed Secondary Dressing: Woven Gauze Sponge, Non-Sterile 4x4 in (Home Health) 2 x Per Week/30 Days Discharge Instructions: Apply over primary dressing as directed. Secondary Dressing: ABD Pad, 8x10 (Home Health) 2 x Per Week/30 Days Discharge Instructions: Apply over primary dressing as directed. Com pression Wrap: FourPress (4 layer compression wrap) (Home Health) 2 x Per Week/30 Days Discharge Instructions: Apply four layer compression as directed. 1. We are using silver alginate to the first toenail bed. I am going to give him empiric doxycycline. Culture is pending 2. Continue with silver alginate to the wounds on his lower legs under 4-layer compression 3. For 1 reason or another he has not been to cardiology apparently showed up yesterday when he did not have an appointment. He is now scheduled for next Wednesday. This is been quite an ordeal at my direction he increase his Lasix to 40 mg and I think that is better in terms of the swelling in his lower legs and his thighs although he still has a remarkable amount of ascites. 4. 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 I am not going to order a paracentesis for At this point. I am thinking he  probably needs about double the dose of Lasix. I do not know that he would be a candidate for a potassium sparing diuretic based on his renal function Electronic Signature(s) Signed: 10/09/2020 5:13:40 PM By: Linton Ham MD Entered By: Linton Ham on 10/09/2020 09:09:21 -------------------------------------------------------------------------------- SuperBill Details Patient Name: Date of Service: [place], [place] 10/09/2020 Medical Record Number: [id number] Patient Account Number: [id number] Date of Birth/Sex: Treating RN: Jun 15, 1941 (80 y.o. 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 Electronic Signature(s) Signed: 10/09/2020 5:13:40 PM By: Linton Ham MD Signed: 10/09/2020 6:08:01 PM By: Deon Pilling Entered By: Deon Pilling on 10/09/2020 09:07:48

## 2020-10-09 NOTE — Progress Notes (Signed)
[name], [name] ([id number]) Visit Report for 10/09/2020 Arrival Information Details Patient Name: Date of Service: [place] 10/09/2020 8:00 [place] Record Number: [id number] Patient Account Number: [id number] Date of Birth/Sex: Treating RN: November 12, 1940 (80 y.o. [name] Primary Care Donalyn Schneeberger: [name]., [name] Other Clinician: Referring [name]: Treating [name]/Extender: [name]., [name] in Treatment: 78 Visit Information History Since Last Visit Added or deleted any medications: No Patient Arrived: Walker Any new allergies or adverse reactions: No Arrival Time: 07:56 Had a fall or experienced change in No Accompanied By: alone activities of daily living that may affect Transfer Assistance: None risk of falls: Patient Identification Verified: Yes Signs or symptoms of abuse/neglect since last visito No Secondary Verification Process Completed: Yes Hospitalized since last visit: No Patient Requires Transmission-Based Precautions: No Implantable device outside of the clinic excluding No Patient Has Alerts: Yes cellular tissue based products placed in the center Patient Alerts: Patient on Blood Thinner since last visit: Has Dressing in Place as Prescribed: Yes Has Compression in Place as Prescribed: Yes Pain Present Now: No Electronic Signature(s) Signed: 10/09/2020 5:58:12 PM By: Troy Hurst RN, BSN Entered By: [name] on 10/09/2020 07:56:25 -------------------------------------------------------------------------------- Compression Therapy Details Patient Name: Date of Service: [place] 10/09/2020 8:00 A M Medical Record Number: [id number] Patient Account Number: [id number] Date of Birth/Sex: Treating RN: 1941-03-11 (80 y.o. [name] Primary Care [name]: [name]., [name] Other Clinician: Referring [name]: Treating [name]/Extender: [name]., [name] in  Treatment: 48 Compression Therapy Performed for Wound Assessment: Wound #11 Right,Proximal,Lateral Lower Leg Performed By: Clinician Troy Gouty, RN Compression Type: Four Layer Post Procedure Diagnosis Same as Pre-procedure Electronic Signature(s) Signed: 10/09/2020 6:08:01 PM By: [name] Entered By: [name] on 10/09/2020 08:39:05 -------------------------------------------------------------------------------- Compression Therapy Details Patient Name: Date of Service: [place] [name] [facility] 10/09/2020 8:00 [place] Record Number: [id number] Patient Account Number: [id number] Date of Birth/Sex: Treating RN: 1941/02/02 (80 y.o. [name] Primary Care [name]: [name]., [name] Other Clinician: Referring Alante Tolan: Treating [name]/Extender: [name]., [name] in Treatment: 52 Compression Therapy Performed for Wound Assessment: Wound #12 Left,Anterior Lower Leg Performed By: Clinician Troy Gouty, RN Compression Type: Four Layer Post Procedure Diagnosis Same as Pre-procedure Electronic Signature(s) Signed: 10/09/2020 6:08:01 PM By: [name] Entered By: [name] on 10/09/2020 08:39:05 -------------------------------------------------------------------------------- Compression Therapy Details Patient Name: Date of Service: [place] [name] [facility] 10/09/2020 8:00 [place] Record Number: [id number] Patient Account Number: [id number] Date of Birth/Sex: Treating RN: [birth year] (80 y.o. [name] Primary Care Enrique Weiss: [name]., [name] Other Clinician: Referring [name]: Treating [name]/Extender: [name]., [name] in Treatment: 52 Compression Therapy Performed for Wound Assessment: Wound #13 Right,Anterior Lower Leg Performed By: Clinician Troy Gouty, RN Compression Type: Four Layer Post Procedure Diagnosis Same as Pre-procedure Electronic Signature(s) Signed: 10/09/2020  6:08:01 PM By: [name] Entered By: [name] on 10/09/2020 08:39:05 -------------------------------------------------------------------------------- Compression Therapy Details Patient Name: Date of Service: [place] [name] [facility] 10/09/2020 8:00 [place] Record Number: [id number] Patient Account Number: [id number] Date of Birth/Sex: Treating RN: December 16, 1940 (80 y.o. [name], [name] Primary Care [name]: [name]., [name] Other Clinician: Referring [name]: Treating [name]/Extender: [name]., [name] in Treatment: 20 Compression Therapy Performed for Wound Assessment:  Wound #14 Left,Lateral Lower Leg Performed By: Clinician Troy Gouty, RN Compression Type: Four Layer Post Procedure Diagnosis Same as Pre-procedure Electronic Signature(s) Signed: 10/09/2020 6:08:01 PM By: [name] Entered By: [name] on 10/09/2020 08:39:05 -------------------------------------------------------------------------------- Compression Therapy Details Patient Name: Date of Service: [place] [name] [facility] 10/09/2020 8:00 [place] Record Number: [id number] Patient Account Number: [id number] Date of Birth/Sex: Treating RN: Aug 04, 1941 (80 y.o. [name] Primary Care [name]: [name]., [name] Other Clinician: Referring [name]: Treating [name]/Extender: [name]., [name] in Treatment: 62 Compression Therapy Performed for Wound Assessment: Wound #4 Right,Distal,Lateral Lower Leg Performed By: Clinician Troy Gouty, RN Compression Type: Four Layer Post Procedure Diagnosis Same as Pre-procedure Electronic Signature(s) Signed: 10/09/2020 6:08:01 PM By: [name] Entered By: [name] on 10/09/2020 08:39:05 -------------------------------------------------------------------------------- Encounter Discharge Information Details Patient Name: Date of Service: [place], [name] 10/09/2020 8:00 [place] Record Number: [id number] Patient Account Number: [id number] Date of Birth/Sex: Treating RN: 11-09-1940 (80 y.o. [name] Primary Care Ernan Runkles: [name]., [name] Other Clinician: Referring Sheriann Newmann: Treating Feiga Nadel/Extender: [name]., [name] in Treatment: 50 Encounter Discharge Information Items Discharge Condition: Stable Ambulatory Status: Walker Discharge Destination: Home Transportation: Other Accompanied By: self Schedule Follow-up Appointment: Yes Clinical Summary of Care: Patient Declined Notes SCAT Electronic Signature(s) Signed: 10/09/2020 5:55:42 PM By: Troy Gouty RN, BSN Entered By: [name] on 10/09/2020 09:16:12 -------------------------------------------------------------------------------- Lower Extremity Assessment Details Patient Name: Date of Service: [place] 10/09/2020 8:00 [place] Record Number: [id number] Patient Account Number: [id number] Date of Birth/Sex: Treating RN: 01-03-1941 (80 y.o. [name] Primary Care Shamonique Battiste: [name]., [name] Other Clinician: Referring Hattie Pine: Treating Sinahi Knights/Extender: [name]., [name] in Treatment: 52 Edema Assessment Assessed: [Left: No] [Right: No] Edema: [Left: Yes] [Right: Yes] Calf Left: Right: Point of Measurement: 53 cm From Medial Instep 38.5 cm 38 cm Ankle Left: Right: Point of Measurement: 14 cm From Medial Instep 21.5 cm 22 cm Vascular Assessment Pulses: Dorsalis Pedis Palpable: [Left:Yes] [Right:Yes] Electronic Signature(s) Signed: 10/09/2020 5:58:12 PM By: Troy Hurst RN, BSN Entered By: [name] on 10/09/2020 08:19:22 -------------------------------------------------------------------------------- Multi Wound Chart Details Patient Name: Date of Service: [place] 10/09/2020 8:00 [place] Record Number: [id number] Patient Account Number: [id number] Date  of Birth/Sex: Treating RN: 1941/02/20 (80 y.o. [name], [name] Primary Care Kerrington [place]: [name]., [name] Other Clinician: Referring Cyntia Staley: Treating Abbiegail Landgren/Extender: [name]., [name] in Treatment: 58 Vital Signs Height(in): 71 Pulse(bpm): 103 Weight(lbs): 198 Blood Pressure(mmHg): 128/79 Body Mass Index(BMI): 28 Temperature(F): 97.6 Respiratory Rate(breaths/min): 22 Photos: [11:No Photos Right, Proximal, Lateral Lower Leg] [12:No Photos Left, Anterior Lower Leg] [13:No Photos Right, Anterior Lower Leg] Wound Location: [11:Gradually Appeared] [12:Gradually Appeared] [13:Blister] Wounding Event: [11:Venous Leg Ulcer] [12:Venous Leg Ulcer] [13:Venous Leg Ulcer] Primary Etiology: [11:N/A] [12:Lymphedema] [13:N/A] Secondary Etiology: [11:Anemia, Congestive Heart Failure,] [12:Anemia, Congestive Heart Failure,] [13:Anemia, Congestive Heart Failure,] Comorbid History: [11:Hypertension, Peripheral Venous Disease, End Stage Renal Disease 09/04/2020] [12:Hypertension, Peripheral Venous Disease, End Stage Renal Disease 09/25/2020] [13:Hypertension, Peripheral Venous Disease, End Stage Renal Disease  10/02/2020] Date Acquired: [11:5] [12:2] [13:1] Weeks of Treatment: [11:Open] [12:Open] [13:Open] Wound Status: [11:No] [12:No] [13:Yes] Clustered Wound: [11:N/A] [12:N/A] [13:1] Clustered Quantity: [11:0.7x0.6x0.1] [12:0.4x0.2x0.1] [13:0.4x0.6x0.1] Measurements L x W x D (cm) [11:0.33] [12:0.063] [13:0.188] A (cm) : rea [  11:0.033] [12:0.006] [13:0.019] Volume (cm) : [11:29.90%] [12:94.70%] [13:87.90%] % Reduction in Area: [11:29.80%] [12:94.90%] [13:87.80%] % Reduction in Volume: [11:Full Thickness Without Exposed] [12:Full Thickness Without Exposed] [13:Full Thickness Without Exposed] Classification: [11:Support Structures Small] [12:Support Structures Small] [13:Support Structures Small] Exudate Amount: [11:Serosanguineous] [12:Serosanguineous]  [13:Serosanguineous] Exudate Type: [11:red, brown] [12:red, brown] [13:red, brown] Exudate Color: [11:Flat and Intact] [12:Distinct, outline attached] [13:Flat and Intact] Wound Margin: [11:Large (67-100%)] [12:Large (67-100%)] [13:Large (67-100%)] Granulation Amount: [11:Pink] [12:Pink] [13:Pink] Granulation Quality: [11:Small (1-33%)] [12:None Present (0%)] [13:None Present (0%)] Necrotic Amount: [11:Fat Layer (Subcutaneous Tissue): Yes Fat Layer (Subcutaneous Tissue): Yes Fat Layer (Subcutaneous Tissue): Yes] Exposed Structures: [11:Fascia: No Tendon: No Muscle: No Joint: No Bone: No Medium (34-66%)] [12:Fascia: No Tendon: No Muscle: No Joint: No Bone: No Medium (34-66%)] [13:Fascia: No Tendon: No Muscle: No Joint: No Bone: No Medium (34-66%)] Epithelialization: [11:Compression Therapy] [12:Compression Therapy] [13:Compression Therapy] Wound Number: [id number]  Photos: No Photos No Photos No Photos Left, Lateral Lower Leg Left T Great oe Right, Distal, Lateral Lower Leg Wound Location: Blister Gradually Appeared Blister Wounding Event: Venous Leg Ulcer Infection - not elsewhere classified Venous Leg Ulcer Primary Etiology: N/A N/A N/A Secondary Etiology: Anemia, Congestive Heart Failure, Anemia, Congestive Heart Failure, Anemia, Congestive Heart Failure, Comorbid History: Hypertension, Peripheral Venous Hypertension, Peripheral Venous Hypertension, Peripheral Venous Disease, End Stage Renal Disease Disease, End Stage Renal Disease Disease, End Stage Renal Disease 10/02/2020 10/09/2020 09/11/2019 Date Acquired: 1 0 52 Weeks of Treatment: Open Open Open Wound Status: No No No Clustered Wound: N/A N/A N/A Clustered Quantity: 1.2x1x0.1 1x2x0.1 0.8x0.3x0.1 Measurements L x W x D (cm) 0.942 1.571 0.188 A (cm) : rea 0.094 0.157 0.019 Volume (cm) : 0.00% N/A 98.00% % Reduction in Area: 50.00% N/A 98.00% % Reduction in Volume: Full Thickness Without Exposed Full Thickness Without  Exposed Full Thickness Without Exposed Classification: Support Structures Support Structures Support Structures Medium Medium Small Exudate Amount: Serosanguineous Purulent Serosanguineous Exudate Type: red, brown yellow, brown, green red, brown Exudate Color: Flat and Intact Indistinct, nonvisible Flat and Intact Wound Margin: Small (1-33%) Small (1-33%) Large (67-100%) Granulation Amount: Pink Pink Pink Granulation Quality: Large (67-100%) Large (67-100%) None Present (0%) Necrotic Amount: Fat Layer (Subcutaneous Tissue): Yes Fat Layer (Subcutaneous Tissue): Yes Fat Layer (Subcutaneous Tissue): Yes Exposed Structures: Fascia: No Fascia: No Fascia: No Tendon: No Tendon: No Tendon: No Muscle: No Muscle: No Muscle: No Joint: No Joint: No Joint: No Bone: No Bone: No Bone: No None None Large (67-100%) Epithelialization: Compression Therapy N/A Compression Therapy Procedures Performed: Treatment Notes Electronic Signature(s) Signed: 10/09/2020 5:13:40 PM By: Linton Ham MD Signed: 10/09/2020 6:08:01 PM By: [name] Entered By: Linton Ham on 10/09/2020 09:05:22 -------------------------------------------------------------------------------- Multi-Disciplinary Care Plan Details Patient Name: Date of Service: [place] 10/09/2020 8:00 A M Medical Record Number: [id number] Patient Account Number: [id number] Date of Birth/Sex: Treating RN: 06-Jan-1941 (80 y.o. [name], [name] Primary Care Paolo Okane: [name]., [name] Other Clinician: Referring Gaines Cartmell: Treating Taeshaun Rames/Extender: [name]., [name] in Treatment: 18 Active Inactive Venous Leg Ulcer Nursing Diagnoses: Knowledge deficit related to disease process and management Potential for venous Insuffiency (use before diagnosis confirmed) Goals: Patient will maintain optimal edema control Date Initiated: 05/01/2020 Target Resolution Date: 10/31/2020 Goal Status:  Active Interventions: Assess peripheral edema status every visit. Compression as ordered Provide education on venous insufficiency Notes: Electronic Signature(s) Signed: 10/09/2020 6:08:01 PM By: [name] Entered By: [name] on 10/09/2020 08:05:14 -------------------------------------------------------------------------------- Pain Assessment  Details Patient Name: Date of Service: [place] 10/09/2020 8:00 [place] Record Number: [id number] Patient Account Number: [id number] Date of Birth/Sex: Treating RN: 1941/03/31 (80 y.o. [name] Primary Care Rayvin Abid: [name]., [name] Other Clinician: Referring Kearstyn Avitia: Treating Justa Hatchell/Extender: [name]., [name] in Treatment: 48 Active Problems Location of Pain Severity and Description of Pain Patient Has Paino No Site Locations Pain Management and Medication Current Pain Management: Electronic Signature(s) Signed: 10/09/2020 5:58:12 PM By: Troy Hurst RN, BSN Entered By: [name] on 10/09/2020 07:56:43 -------------------------------------------------------------------------------- Patient/Caregiver Education Details Patient Name: Date of Service: [place], [place] 2/10/2022andnbsp8:00 A M Medical Record Number: [id number] Patient Account Number: [id number] Date of Birth/Gender: Treating RN: November 02, 1940 (80 y.o. [name] Primary Care Physician: [name]., [name] Other Clinician: Referring Physician: Treating Physician/Extender: [name]., [name] in Treatment: 24 Education Assessment Education Provided To: Patient Education Topics Provided Venous: Handouts: Managing Venous Disease and Related Ulcers Methods: Explain/Verbal Responses: Reinforcements needed Electronic Signature(s) Signed: 10/09/2020 6:08:01 PM By: [name] Entered By: [name] on 10/09/2020  08:05:28 -------------------------------------------------------------------------------- Wound Assessment Details Patient Name: Date of Service: [place] [name] 10/09/2020 8:00 [place] Record Number: [id number] Patient Account Number: [id number] Date of Birth/Sex: Treating RN: 11/28/1940 (80 y.o. [name] Primary Care Chantell Kunkler: [name]., [name] Other Clinician: Referring Linkoln Alkire: Treating Quindarrius J[place]/Extender: [name]., [name] in Treatment: 52 Wound Status Wound Number: 11 Primary Venous Leg Ulcer Etiology: Wound Location: Right, Proximal, Lateral Lower Leg Wound Open Wounding Event: Gradually Appeared Status: Date Acquired: 09/04/2020 Comorbid Anemia, Congestive Heart Failure, Hypertension, Peripheral Weeks Of Treatment: 5 History: Venous Disease, End Stage Renal Disease Clustered Wound: No Wound Measurements Length: (cm) 0.7 Width: (cm) 0.6 Depth: (cm) 0.1 Area: (cm) 0.33 Volume: (cm) 0.033 % Reduction in Area: 29.9% % Reduction in Volume: 29.8% Epithelialization: Medium (34-66%) Tunneling: No Undermining: No Wound Description Classification: Full Thickness Without Exposed Support Structu Wound Margin: Flat and Intact Exudate Amount: Small Exudate Type: Serosanguineous Exudate Color: red, brown res Foul Odor After Cleansing: No Slough/Fibrino Yes Wound Bed Granulation Amount: Large (67-100%) Exposed Structure Granulation Quality: Pink Fascia Exposed: No Necrotic Amount: Small (1-33%) Fat Layer (Subcutaneous Tissue) Exposed: Yes Necrotic Quality: Adherent Slough Tendon Exposed: No Muscle Exposed: No Joint Exposed: No Bone Exposed: No Treatment Notes Wound #11 (Lower Leg) Wound Laterality: Right, Lateral, Proximal Cleanser Soap and Water Discharge Instruction: May shower and wash wound with dial antibacterial soap and water prior to dressing change. Peri-Wound Care Sween Lotion (Moisturizing  lotion) Discharge Instruction: Apply moisturizing lotion as directed Triamcinolone 15 (g) Discharge Instruction: Mixed with lotion in clinic only. Use triamcinolone 15 (g) as directed Topical Primary Dressing KerraCel Ag Gelling Fiber Dressing, 4x5 in (silver alginate) Discharge Instruction: Apply silver alginate to wound bed as instructed Secondary Dressing Woven Gauze Sponge, Non-Sterile 4x4 in Discharge Instruction: Apply over primary dressing as directed. ABD Pad, 8x10 Discharge Instruction: Apply over primary dressing as directed. Secured With Compression Wrap FourPress (4 layer compression wrap) Discharge Instruction: Apply four layer compression as directed. Compression Stockings Add-Ons Electronic Signature(s) Signed: 10/09/2020 5:58:12 PM By: Troy Hurst RN, BSN Entered By: [name] on 10/09/2020 08:17:32 -------------------------------------------------------------------------------- Wound Assessment Details Patient Name: Date of Service: [place] 10/09/2020 8:00 A M Medical Record Number: [id number] Patient Account Number: [id number] Date of Birth/Sex: Treating  RN: Jan 24, 1941 (80 y.o. [name] Primary Care Adysen Raphael: [name]., [name] Other Clinician: Referring Dimitrious Micciche: Treating Yony Roulston/Extender: [name]., [name] in Treatment: 52 Wound Status Wound Number: 12 Primary Venous Leg Ulcer Etiology: Etiology: Wound Location: Left, Anterior Lower Leg Secondary Lymphedema Wounding Event: Gradually Appeared Etiology: Date Acquired: 09/25/2020 Wound Open Weeks Of Treatment: 2 Status: Clustered Wound: No Comorbid Anemia, Congestive Heart Failure, Hypertension, Peripheral History: Venous Disease, End Stage Renal Disease Wound Measurements Length: (cm) 0.4 Width: (cm) 0.2 Depth: (cm) 0.1 Area: (cm) 0.063 Volume: (cm) 0.006 % Reduction in Area: 94.7% % Reduction in Volume: 94.9% Epithelialization:  Medium (34-66%) Tunneling: No Undermining: No Wound Description Classification: Full Thickness Without Exposed Support Structures Wound Margin: Distinct, outline attached Exudate Amount: Small Exudate Type: Serosanguineous Exudate Color: red, brown Foul Odor After Cleansing: No Slough/Fibrino Yes Wound Bed Granulation Amount: Large (67-100%) Exposed Structure Granulation Quality: Pink Fascia Exposed: No Necrotic Amount: None Present (0%) Fat Layer (Subcutaneous Tissue) Exposed: Yes Tendon Exposed: No Muscle Exposed: No Joint Exposed: No Bone Exposed: No Treatment Notes Wound #12 (Lower Leg) Wound Laterality: Left, Anterior Cleanser Soap and Water Discharge Instruction: May shower and wash wound with dial antibacterial soap and water prior to dressing change. Peri-Wound Care Sween Lotion (Moisturizing lotion) Discharge Instruction: Apply moisturizing lotion as directed Triamcinolone 15 (g) Discharge Instruction: Mixed with lotion in clinic only. Use triamcinolone 15 (g) as directed Topical Primary Dressing KerraCel Ag Gelling Fiber Dressing, 4x5 in (silver alginate) Discharge Instruction: Apply silver alginate to wound bed as instructed Secondary Dressing Woven Gauze Sponge, Non-Sterile 4x4 in Discharge Instruction: Apply over primary dressing as directed. ABD Pad, 8x10 Discharge Instruction: Apply over primary dressing as directed. Secured With Compression Wrap FourPress (4 layer compression wrap) Discharge Instruction: Apply four layer compression as directed. Compression Stockings Add-Ons Electronic Signature(s) Signed: 10/09/2020 5:58:12 PM By: Troy Hurst RN, BSN Entered By: [name] on 10/09/2020 08:17:44 -------------------------------------------------------------------------------- Wound Assessment Details Patient Name: Date of Service: [place]ling [place] 10/09/2020 8:00 [place] Record Number: [id number] Patient Account Number:  [id number] Date of Birth/Sex: Treating RN: [birth year] (80 y.o. [name] Primary Care Valaria Kohut: [name]., [name] Other Clinician: Referring Flor Whitacre: Treating Redith Drach/Extender: [name]., [name] in Treatment: 52 Wound Status Wound Number: 13 Primary Venous Leg Ulcer Etiology: Wound Location: Right, Anterior Lower Leg Wound Open Wounding Event: Blister Status: Date Acquired: 10/02/2020 Comorbid Anemia, Congestive Heart Failure, Hypertension, Peripheral Weeks Of Treatment: 1 History: Venous Disease, End Stage Renal Disease Clustered Wound: Yes Wound Measurements Length: (cm) 0.4 Width: (cm) 0.6 Depth: (cm) 0.1 Clustered Quantity: 1 Area: (cm) 0.188 Volume: (cm) 0.019 % Reduction in Area: 87.9% % Reduction in Volume: 87.8% Epithelialization: Medium (34-66%) Tunneling: No Undermining: No Wound Description Classification: Full Thickness Without Exposed Support Structures Wound Margin: Flat and Intact Exudate Amount: Small Exudate Type: Serosanguineous Exudate Color: red, brown Foul Odor After Cleansing: No Slough/Fibrino No Wound Bed Granulation Amount: Large (67-100%) Exposed Structure Granulation Quality: Pink Fascia Exposed: No Necrotic Amount: None Present (0%) Fat Layer (Subcutaneous Tissue) Exposed: Yes Tendon Exposed: No Muscle Exposed: No Joint Exposed: No Bone Exposed: No Treatment Notes Wound #13 (Lower Leg) Wound Laterality: Right, Anterior Cleanser Soap and Water Discharge Instruction: May shower and wash wound with dial antibacterial soap and water prior to dressing change. Peri-Wound Care Sween Lotion (Moisturizing lotion) Discharge Instruction: Apply moisturizing lotion as directed Triamcinolone 15 (g) Discharge Instruction: Mixed with  lotion in clinic only. Use triamcinolone 15 (g) as directed Topical Primary Dressing KerraCel Ag Gelling Fiber Dressing, 4x5 in (silver alginate) Discharge Instruction: Apply  silver alginate to wound bed as instructed Secondary Dressing Woven Gauze Sponge, Non-Sterile 4x4 in Discharge Instruction: Apply over primary dressing as directed. ABD Pad, 8x10 Discharge Instruction: Apply over primary dressing as directed. Secured With Compression Wrap FourPress (4 layer compression wrap) Discharge Instruction: Apply four layer compression as directed. Compression Stockings Add-Ons Electronic Signature(s) Signed: 10/09/2020 5:58:12 PM By: Troy Hurst RN, BSN Entered By: [name] on 10/09/2020 08:18:07 -------------------------------------------------------------------------------- Wound Assessment Details Patient Name: Date of Service: [name] 10/09/2020 8:00 [place] Record Number: [id number] Patient Account Number: [id number] Date of Birth/Sex: Treating RN: 1940/11/01 (80 y.o. [name] Primary Care Deondray Ospina: [name]., [name] Other Clinician: Referring Scottlyn Mchaney: Treating Ayo Smoak/Extender: [name]., [name] in Treatment: 52 Wound Status Wound Number: 14 Primary Venous Leg Ulcer Etiology: Wound Location: Left, Lateral Lower Leg Wound Open Wounding Event: Blister Status: Date Acquired: 10/02/2020 Comorbid Anemia, Congestive Heart Failure, Hypertension, Peripheral Weeks Of Treatment: 1 History: Venous Disease, End Stage Renal Disease Clustered Wound: No Wound Measurements Length: (cm) 1.2 Width: (cm) 1 Depth: (cm) 0.1 Area: (cm) 0.942 Volume: (cm) 0.094 % Reduction in Area: 0% % Reduction in Volume: 50% Epithelialization: None Tunneling: No Undermining: No Wound Description Classification: Full Thickness Without Exposed Support Structures Wound Margin: Flat and Intact Exudate Amount: Medium Exudate Type: Serosanguineous Exudate Color: red, brown Foul Odor After Cleansing: No Slough/Fibrino Yes Wound Bed Granulation Amount: Small (1-33%) Exposed Structure Granulation Quality:  Pink Fascia Exposed: No Necrotic Amount: Large (67-100%) Fat Layer (Subcutaneous Tissue) Exposed: Yes Necrotic Quality: Adherent Slough Tendon Exposed: No Muscle Exposed: No Joint Exposed: No Bone Exposed: No Treatment Notes Wound #14 (Lower Leg) Wound Laterality: Left, Lateral Cleanser Soap and Water Discharge Instruction: May shower and wash wound with dial antibacterial soap and water prior to dressing change. Peri-Wound Care Sween Lotion (Moisturizing lotion) Discharge Instruction: Apply moisturizing lotion as directed Triamcinolone 15 (g) Discharge Instruction: Mixed with lotion in clinic only. Use triamcinolone 15 (g) as directed Topical Primary Dressing KerraCel Ag Gelling Fiber Dressing, 4x5 in (silver alginate) Discharge Instruction: Apply silver alginate to wound bed as instructed Secondary Dressing Woven Gauze Sponge, Non-Sterile 4x4 in Discharge Instruction: Apply over primary dressing as directed. ABD Pad, 8x10 Discharge Instruction: Apply over primary dressing as directed. Secured With Compression Wrap FourPress (4 layer compression wrap) Discharge Instruction: Apply four layer compression as directed. Compression Stockings Add-Ons Electronic Signature(s) Signed: 10/09/2020 5:58:12 PM By: Troy Hurst RN, BSN Entered By: [name] on 10/09/2020 08:18:25 -------------------------------------------------------------------------------- Wound Assessment Details Patient Name: Date of Service: [place] 10/09/2020 8:00 [place] Record Number: [id number] Patient Account Number: [id number] Date of Birth/Sex: Treating RN: 03/26/1941 (80 y.o. [name] Primary Care Jezabel Lecker: [name]., [name] Other Clinician: Referring Thurmond Hildebran: Treating Hisako Bugh/Extender: [name]., [name] in Treatment: 52 Wound Status Wound Number: 15 Primary Infection - not elsewhere classified Etiology: Wound Location: Left T  Great oe Wound Open Wounding Event: Gradually Appeared Status: Date Acquired: 10/09/2020 Comorbid Anemia, Congestive Heart Failure, Hypertension, Peripheral Weeks Of Treatment: 0 History: Venous Disease, End Stage Renal Disease Clustered Wound: No Wound Measurements Length: (cm) 1 Width: (cm) 2 Depth: (cm) 0.1 Area: (cm) 1.571 Volume: (cm) 0.157 % Reduction in Area: % Reduction in Volume: Epithelialization: None  Tunneling: No Undermining: No Wound Description Classification: Full Thickness Without Exposed Support Structures Wound Margin: Indistinct, nonvisible Exudate Amount: Medium Exudate Type: Purulent Exudate Color: yellow, brown, green Foul Odor After Cleansing: No Slough/Fibrino Yes Wound Bed Granulation Amount: Small (1-33%) Exposed Structure Granulation Quality: Pink Fascia Exposed: No Necrotic Amount: Large (67-100%) Fat Layer (Subcutaneous Tissue) Exposed: Yes Necrotic Quality: Adherent Slough Tendon Exposed: No Muscle Exposed: No Joint Exposed: No Bone Exposed: No Treatment Notes Wound #15 (Toe Great) Wound Laterality: Left Cleanser Soap and Water Discharge Instruction: May shower and wash wound with dial antibacterial soap and water prior to dressing change. Peri-Wound Care Topical Mupirocin Ointment Discharge Instruction: ***In Clinic only***. Apply Mupirocin (Bactroban) under Alignate Ag. PolySporin Discharge Instruction: patient to purchase and home health to apply under the alginate Ag, Primary Dressing KerraCel Ag Gelling Fiber Dressing, 2x2 in (silver alginate) Discharge Instruction: Apply silver alginate to wound bed as instructed Secondary Dressing Woven Gauze Sponges 2x2 in Discharge Instruction: Apply over primary dressing as directed. Secured With Conforming Stretch Gauze Bandage, Sterile 2x75 (in/in) Discharge Instruction: Secure with stretch gauze as directed. Compression Wrap Compression Stockings Add-Ons Electronic  Signature(s) Signed: 10/09/2020 5:58:12 PM By: Troy Hurst RN, BSN Entered By: [name] on 10/09/2020 08:16:00 -------------------------------------------------------------------------------- Wound Assessment Details Patient Name: Date of Service: [place] 10/09/2020 8:00 [place] Record Number: [id number] Patient Account Number: [id number] Date of Birth/Sex: Treating RN: 10-05-1940 (80 y.o. [name] Primary Care Thurmon Mizell: [name]., [name] Other Clinician: Referring Ceira Hoeschen: Treating Veronica Fretz/Extender: [name]., [name] in Treatment: 52 Wound Status Wound Number: 4 Primary Venous Leg Ulcer Etiology: Wound Location: Right, Distal, Lateral Lower Leg Wound Open Wounding Event: Blister Status: Date Acquired: 09/11/2019 Comorbid Anemia, Congestive Heart Failure, Hypertension, Peripheral Weeks Of Treatment: 52 History: Venous Disease, End Stage Renal Disease Clustered Wound: No Wound Measurements Length: (cm) 0.8 Width: (cm) 0.3 Depth: (cm) 0.1 Area: (cm) 0.188 Volume: (cm) 0.019 % Reduction in Area: 98% % Reduction in Volume: 98% Epithelialization: Large (67-100%) Tunneling: No Undermining: No Wound Description Classification: Full Thickness Without Exposed Support Structures Wound Margin: Flat and Intact Exudate Amount: Small Exudate Type: Serosanguineous Exudate Color: red, brown Foul Odor After Cleansing: No Slough/Fibrino No Wound Bed Granulation Amount: Large (67-100%) Exposed Structure Granulation Quality: Pink Fascia Exposed: No Necrotic Amount: None Present (0%) Fat Layer (Subcutaneous Tissue) Exposed: Yes Tendon Exposed: No Muscle Exposed: No Joint Exposed: No Bone Exposed: No Treatment Notes Wound #4 (Lower Leg) Wound Laterality: Right, Lateral, Distal Cleanser Soap and Water Discharge Instruction: May shower and wash wound with dial antibacterial soap and water prior to dressing  change. Peri-Wound Care Sween Lotion (Moisturizing lotion) Discharge Instruction: Apply moisturizing lotion as directed Triamcinolone 15 (g) Discharge Instruction: Mixed with lotion in clinic only. Use triamcinolone 15 (g) as directed Topical Primary Dressing KerraCel Ag Gelling Fiber Dressing, 4x5 in (silver alginate) Discharge Instruction: Apply silver alginate to wound bed as instructed Secondary Dressing Woven Gauze Sponge, Non-Sterile 4x4 in Discharge Instruction: Apply over primary dressing as directed. ABD Pad, 8x10 Discharge Instruction: Apply over primary dressing as directed. Secured With Compression Wrap FourPress (4 layer compression wrap) Discharge Instruction: Apply four layer compression as directed. Compression Stockings Add-Ons Electronic Signature(s) Signed: 10/09/2020 5:58:12 PM By: Troy Hurst RN, BSN Entered By: [name] on 10/09/2020 08:18:47 -------------------------------------------------------------------------------- [place] Details Patient Name: Date of Service: [place] 10/09/2020 8:00 [place] Number: [id number] Patient Account Number:  [id number] Date of Birth/Sex: Treating RN: 1941-07-08 (80 y.o. [name] Primary Care Erian Rosengren: [name]., [name] Other Clinician: Referring Henley Blyth: Treating Joanathan Affeldt/Extender: [name]., [name] in Treatment: 70 Vital Signs Time Taken: 07:56 Temperature (F): 97.6 Height (in): 71 Pulse (bpm): 103 Weight (lbs): 198 Respiratory Rate (breaths/min): 22 Body Mass Index (BMI): 27.6 Blood Pressure (mmHg): 128/79 Reference Range: 80 - 120 mg / dl Electronic Signature(s) Signed: 10/09/2020 5:58:12 PM By: Troy Hurst RN, BSN Entered By: [name] on 10/09/2020 07:56:36

## 2020-10-10 ENCOUNTER — Other Ambulatory Visit (HOSPITAL_COMMUNITY)
Admit: 2020-10-10 | Discharge: 2020-10-10 | Disposition: A | Discharge status: Home / Self Care | Payer: Medicare Other | Source: Ambulatory Visit | Attending: Internal Medicine | Admitting: Internal Medicine

## 2020-10-10 DIAGNOSIS — S90412A Abrasion, left great toe, initial encounter: Secondary | ICD-10-CM | POA: Insufficient documentation

## 2020-10-11 ENCOUNTER — Emergency Department (HOSPITAL_COMMUNITY): Payer: Medicare Other

## 2020-10-11 ENCOUNTER — Other Ambulatory Visit: Payer: Self-pay

## 2020-10-11 ENCOUNTER — Encounter (HOSPITAL_COMMUNITY): Payer: Self-pay | Admitting: Internal Medicine

## 2020-10-11 ENCOUNTER — Inpatient Hospital Stay (HOSPITAL_COMMUNITY)
Admission: EM | Admit: 2020-10-11 | Discharge: 2020-10-15 | DRG: 291 | Disposition: A | Discharge status: Home / Self Care | Payer: Medicare Other | Attending: Internal Medicine | Admitting: Internal Medicine

## 2020-10-11 ENCOUNTER — Encounter (HOSPITAL_COMMUNITY): Payer: Self-pay | Admitting: Emergency Medicine

## 2020-10-11 DIAGNOSIS — N401 Enlarged prostate with lower urinary tract symptoms: Secondary | ICD-10-CM | POA: Diagnosis not present

## 2020-10-11 DIAGNOSIS — D539 Nutritional anemia, unspecified: Secondary | ICD-10-CM | POA: Diagnosis present

## 2020-10-11 DIAGNOSIS — Z87891 Personal history of nicotine dependence: Secondary | ICD-10-CM

## 2020-10-11 DIAGNOSIS — Z833 Family history of diabetes mellitus: Secondary | ICD-10-CM

## 2020-10-11 DIAGNOSIS — F1020 Alcohol dependence, uncomplicated: Secondary | ICD-10-CM | POA: Diagnosis present

## 2020-10-11 DIAGNOSIS — R918 Other nonspecific abnormal finding of lung field: Secondary | ICD-10-CM | POA: Diagnosis present

## 2020-10-11 DIAGNOSIS — I248 Other forms of acute ischemic heart disease: Secondary | ICD-10-CM | POA: Diagnosis present

## 2020-10-11 DIAGNOSIS — Z86718 Personal history of other venous thrombosis and embolism: Secondary | ICD-10-CM

## 2020-10-11 DIAGNOSIS — K219 Gastro-esophageal reflux disease without esophagitis: Secondary | ICD-10-CM | POA: Diagnosis present

## 2020-10-11 DIAGNOSIS — E1151 Type 2 diabetes mellitus with diabetic peripheral angiopathy without gangrene: Secondary | ICD-10-CM | POA: Diagnosis present

## 2020-10-11 DIAGNOSIS — Z79899 Other long term (current) drug therapy: Secondary | ICD-10-CM

## 2020-10-11 DIAGNOSIS — E782 Mixed hyperlipidemia: Secondary | ICD-10-CM | POA: Diagnosis present

## 2020-10-11 DIAGNOSIS — Z8249 Family history of ischemic heart disease and other diseases of the circulatory system: Secondary | ICD-10-CM

## 2020-10-11 DIAGNOSIS — I472 Ventricular tachycardia: Secondary | ICD-10-CM | POA: Diagnosis present

## 2020-10-11 DIAGNOSIS — I5082 Biventricular heart failure: Secondary | ICD-10-CM | POA: Diagnosis present

## 2020-10-11 DIAGNOSIS — G8929 Other chronic pain: Secondary | ICD-10-CM | POA: Diagnosis present

## 2020-10-11 DIAGNOSIS — I420 Dilated cardiomyopathy: Secondary | ICD-10-CM | POA: Diagnosis present

## 2020-10-11 DIAGNOSIS — N138 Other obstructive and reflux uropathy: Secondary | ICD-10-CM | POA: Diagnosis present

## 2020-10-11 DIAGNOSIS — I130 Hypertensive heart and chronic kidney disease with heart failure and stage 1 through stage 4 chronic kidney disease, or unspecified chronic kidney disease: Principal | ICD-10-CM | POA: Diagnosis present

## 2020-10-11 DIAGNOSIS — Z8719 Personal history of other diseases of the digestive system: Secondary | ICD-10-CM

## 2020-10-11 DIAGNOSIS — K7460 Unspecified cirrhosis of liver: Secondary | ICD-10-CM | POA: Diagnosis not present

## 2020-10-11 DIAGNOSIS — I10 Essential (primary) hypertension: Secondary | ICD-10-CM

## 2020-10-11 DIAGNOSIS — U071 COVID-19: Secondary | ICD-10-CM | POA: Diagnosis present

## 2020-10-11 DIAGNOSIS — I5022 Chronic systolic (congestive) heart failure: Secondary | ICD-10-CM

## 2020-10-11 DIAGNOSIS — N184 Chronic kidney disease, stage 4 (severe): Secondary | ICD-10-CM

## 2020-10-11 DIAGNOSIS — E1122 Type 2 diabetes mellitus with diabetic chronic kidney disease: Secondary | ICD-10-CM | POA: Diagnosis present

## 2020-10-11 DIAGNOSIS — R188 Other ascites: Secondary | ICD-10-CM

## 2020-10-11 DIAGNOSIS — I5043 Acute on chronic combined systolic (congestive) and diastolic (congestive) heart failure: Secondary | ICD-10-CM

## 2020-10-11 DIAGNOSIS — E559 Vitamin D deficiency, unspecified: Secondary | ICD-10-CM | POA: Diagnosis present

## 2020-10-11 DIAGNOSIS — Z83438 Family history of other disorder of lipoprotein metabolism and other lipidemia: Secondary | ICD-10-CM

## 2020-10-11 DIAGNOSIS — E8770 Fluid overload, unspecified: Secondary | ICD-10-CM | POA: Diagnosis present

## 2020-10-11 DIAGNOSIS — I083 Combined rheumatic disorders of mitral, aortic and tricuspid valves: Secondary | ICD-10-CM | POA: Diagnosis present

## 2020-10-11 DIAGNOSIS — N179 Acute kidney failure, unspecified: Secondary | ICD-10-CM | POA: Diagnosis present

## 2020-10-11 DIAGNOSIS — Z9114 Patient's other noncompliance with medication regimen: Secondary | ICD-10-CM

## 2020-10-11 DIAGNOSIS — J9601 Acute respiratory failure with hypoxia: Secondary | ICD-10-CM | POA: Diagnosis present

## 2020-10-11 DIAGNOSIS — Z7982 Long term (current) use of aspirin: Secondary | ICD-10-CM

## 2020-10-11 LAB — CBC
HCT: 30 % — ABNORMAL LOW (ref 39.0–52.0)
Hemoglobin: 9.1 g/dL — ABNORMAL LOW (ref 13.0–17.0)
MCH: 27.4 pg (ref 26.0–34.0)
MCHC: 30.3 g/dL (ref 30.0–36.0)
MCV: 90.4 fL (ref 80.0–100.0)
Platelets: 256 10*3/uL (ref 150–400)
RBC: 3.32 MIL/uL — ABNORMAL LOW (ref 4.22–5.81)
RDW: 18.6 % — ABNORMAL HIGH (ref 11.5–15.5)
WBC: 4 10*3/uL (ref 4.0–10.5)
nRBC: 0 % (ref 0.0–0.2)

## 2020-10-11 LAB — BASIC METABOLIC PANEL
Anion gap: 11 (ref 5–15)
BUN: 57 mg/dL — ABNORMAL HIGH (ref 8–23)
CO2: 26 mmol/L (ref 22–32)
Calcium: 8.5 mg/dL — ABNORMAL LOW (ref 8.9–10.3)
Chloride: 100 mmol/L (ref 98–111)
Creatinine, Ser: 2.34 mg/dL — ABNORMAL HIGH (ref 0.61–1.24)
GFR, Estimated: 28 mL/min — ABNORMAL LOW (ref >60)
Glucose, Bld: 114 mg/dL — ABNORMAL HIGH (ref 70–99)
Potassium: 4 mmol/L (ref 3.5–5.1)
Sodium: 137 mmol/L (ref 135–145)

## 2020-10-11 LAB — HEPATIC FUNCTION PANEL
ALT: 8 U/L (ref 0–44)
AST: 16 U/L (ref 15–41)
Albumin: 2.9 g/dL — ABNORMAL LOW (ref 3.5–5.0)
Alkaline Phosphatase: 54 U/L (ref 38–126)
Bilirubin, Direct: 0.6 mg/dL — ABNORMAL HIGH (ref 0.0–0.2)
Indirect Bilirubin: 0.8 mg/dL (ref 0.3–0.9)
Total Bilirubin: 1.4 mg/dL — ABNORMAL HIGH (ref 0.3–1.2)
Total Protein: 8.7 g/dL — ABNORMAL HIGH (ref 6.5–8.1)

## 2020-10-11 LAB — PROTIME-INR
INR: 1.3 — ABNORMAL HIGH (ref 0.8–1.2)
Prothrombin Time: 16 seconds — ABNORMAL HIGH (ref 11.4–15.2)

## 2020-10-11 LAB — MAGNESIUM: Magnesium: 2.3 mg/dL (ref 1.7–2.4)

## 2020-10-11 LAB — BRAIN NATRIURETIC PEPTIDE: B Natriuretic Peptide: 4154.2 pg/mL — ABNORMAL HIGH (ref 0.0–100.0)

## 2020-10-11 MED ORDER — ACETAMINOPHEN 325 MG PO TABS: 650.0000 mg | ORAL_TABLET | ORAL | Status: DC | PRN

## 2020-10-11 MED ORDER — LISINOPRIL 10 MG PO TABS: 5.0000 mg | ORAL_TABLET | Freq: Every day | ORAL | Status: DC

## 2020-10-11 MED ORDER — FUROSEMIDE 10 MG/ML IJ SOLN
60.0000 mg | Freq: Two times a day (BID) | INTRAMUSCULAR | Status: DC
  Administered 2020-10-12: 60 mg via INTRAVENOUS
  Filled 2020-10-11: qty 6

## 2020-10-11 MED ORDER — ENOXAPARIN SODIUM 30 MG/0.3ML ~~LOC~~ SOLN
30.0000 mg | SUBCUTANEOUS | Status: DC
  Administered 2020-10-11 – 2020-10-12 (×2): 30 mg via SUBCUTANEOUS
  Filled 2020-10-11 (×2): qty 0.3

## 2020-10-11 MED ORDER — POLYETHYLENE GLYCOL 3350 17 G PO PACK: 17.0000 g | PACK | Freq: Every day | ORAL | Status: DC | PRN

## 2020-10-11 MED ORDER — SODIUM CHLORIDE 0.9% FLUSH
3.0000 mL | Freq: Two times a day (BID) | INTRAVENOUS | Status: DC
  Administered 2020-10-11 – 2020-10-15 (×7): 3 mL via INTRAVENOUS

## 2020-10-11 MED ORDER — SODIUM CHLORIDE 0.9 % IV SOLN: 250.0000 mL | INTRAVENOUS | Status: DC | PRN

## 2020-10-11 MED ORDER — FUROSEMIDE 10 MG/ML IJ SOLN
60.0000 mg | Freq: Once | INTRAMUSCULAR | Status: AC
  Administered 2020-10-11: 60 mg via INTRAVENOUS
  Filled 2020-10-11: qty 8

## 2020-10-11 MED ORDER — SODIUM CHLORIDE 0.9% FLUSH: 3.0000 mL | INTRAVENOUS | Status: DC | PRN

## 2020-10-11 MED ORDER — ASPIRIN EC 81 MG PO TBEC
81.0000 mg | DELAYED_RELEASE_TABLET | Freq: Every day | ORAL | Status: DC
  Administered 2020-10-11 – 2020-10-15 (×5): 81 mg via ORAL
  Filled 2020-10-11 (×5): qty 1

## 2020-10-11 MED ORDER — FUROSEMIDE 10 MG/ML IJ SOLN
20.0000 mg | Freq: Once | INTRAMUSCULAR | Status: AC
  Administered 2020-10-11: 20 mg via INTRAVENOUS
  Filled 2020-10-11: qty 4

## 2020-10-11 MED ORDER — PANTOPRAZOLE SODIUM 40 MG PO TBEC: 40.0000 mg | DELAYED_RELEASE_TABLET | Freq: Every day | ORAL | Status: DC

## 2020-10-11 MED ORDER — ONDANSETRON HCL 4 MG/2ML IJ SOLN: 4.0000 mg | Freq: Four times a day (QID) | INTRAMUSCULAR | Status: DC | PRN

## 2020-10-11 MED ORDER — CARVEDILOL 3.125 MG PO TABS
3.1250 mg | ORAL_TABLET | Freq: Two times a day (BID) | ORAL | Status: DC
  Administered 2020-10-11 – 2020-10-15 (×8): 3.125 mg via ORAL
  Filled 2020-10-11 (×8): qty 1

## 2020-10-11 NOTE — H&P (Signed)
History and Physical    [name] QXI:[id number] DOB: 15-Dec-1940 DOA: 10/11/2020  PCP: [name], MD  Patient coming from: Wound care clinic   Chief Complaint:  Chief Complaint  Patient presents with  . 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  Patient explains that for at least the past month he has been experiencing increasing swelling of his lower extremities.  This edema has gradually worsened over time and has become associated with worsening dyspnea on exertion and paroxysmal nocturnal dyspnea.  As of late, patient is also began to develop substantial increase in his abdominal girth.  Patient explains that his abdomen in particular is so large that he is having difficulty taking a deep breath.  Upon further questioning patient denies chest pain.  Patient denies cough or fevers.  Patient denies excessive salt intake and states that he is compliant with all medications including his usual regimen of Demadex.  Patient symptoms continued to worsen.  Patient's wound care provider who sees the patient quite regularly became so concerned that he sent the patient to [facility] emergency department for evaluation.  Upon evaluation in the emergency department patient's been found to have a markedly elevated BNP of 4154.  Additionally, patient is found to have clinical  evidence of substantial volume overload including markedly protuberant abdomen concerning for substantial ascites.  Patient is been provided a 80 mg of intravenous Lasix and the hospitalist group has now been called to assess patient for admission the hospital.  Review of Systems:   ROS  Past Medical History:  Diagnosis Date  . Acute cholecystitis 11/23/2017  . Acute GI bleeding 06/30/2019  . Acute respiratory failure with hypoxia ([place])   . Acute upper back pain   . AKI (acute kidney injury) ([place])   . Alcohol abuse 02/26/2015  . Anemia, deficiency   . Back pain    LUMBOSACRAL  . Bronchitis, complicated   . Cardiogenic shock ([place])   . Cardiomegaly   . Cholangitis due to bile duct calculus with obstruction   . Chronic combined systolic and diastolic CHF (congestive heart failure) ([place])   . Chronic pain    HIGH RISK MED USE COMPREHENSIVE HIGH RISK  . CKD (chronic kidney disease), stage III ([place])   . Demand ischemia ([place])    a. minimally elevated troponin peak 0.11 in 2016, felt demand ischemia. Cath offered but pt wished to have done back in [place].  . Diabetes mellitus without complication ([place])   . Edema, leg   . Elevated troponin 02/26/2015  . Essential hypertension   . Fatigue   . Heart abnormality   . Hyperlipidemia   . Hypertension   . Neck pain, acute   . PVD (peripheral vascular disease) ([place])   . Renal lesion 11/23/2017  . SOB (shortness of breath) on exertion   . Solitary kidney   . Vitamin D deficiency     Past Surgical History:  Procedure Laterality Date  .  BIOPSY  05/16/2020   Procedure: BIOPSY;  Surgeon: Carol Ada, MD;  Location: [facility] ENDOSCOPY;  Service: Endoscopy;;  . ENDOSCOPIC RETROGRADE CHOLANGIOPANCREATOGRAPHY (ERCP) WITH PROPOFOL N/A 06/19/2019   Procedure: ENDOSCOPIC RETROGRADE CHOLANGIOPANCREATOGRAPHY (ERCP) WITH PROPOFOL;  Surgeon: Milus Banister, MD;  Location: [facility] ENDOSCOPY;  Service: Endoscopy;  Laterality: N/A;  . ESOPHAGOGASTRODUODENOSCOPY (EGD)  WITH PROPOFOL N/A 07/01/2019   Procedure: ESOPHAGOGASTRODUODENOSCOPY (EGD) WITH PROPOFOL;  Surgeon: Rush Landmark Telford Nab., MD;  Location: [facility];  Service: Gastroenterology;  Laterality: N/A;  . ESOPHAGOGASTRODUODENOSCOPY (EGD) WITH PROPOFOL N/A 05/16/2020   Procedure: ESOPHAGOGASTRODUODENOSCOPY (EGD) WITH PROPOFOL;  Surgeon: Carol Ada, MD;  Location: [facility] ENDOSCOPY;  Service: Endoscopy;  Laterality: N/A;  . HOT HEMOSTASIS N/A 05/16/2020   Procedure: HOT HEMOSTASIS (ARGON PLASMA COAGULATION/BICAP);  Surgeon: Carol Ada, MD;  Location: [facility] ENDOS[facility];  Service: Endoscopy;  Laterality: N/A;  . IR CHOLANGIOGRAM EXISTING TUBE  12/27/2017  . IR PERC CHOLECYSTOSTOMY  11/25/2017  . IR RADIOLOGIST EVAL & MGMT  02/02/2018  . REMOVAL OF STONES  06/19/2019   Procedure: REMOVAL OF STONES;  Surgeon: Milus Banister, MD;  Location: [facility] ENDOSCOPY;  Service: Endoscopy;;  . Joan Mayans  06/19/2019   Procedure: Joan Mayans;  Surgeon: Milus Banister, MD;  Location: [facility] ENDOSCOPY;  Service: Endoscopy;;     reports that he quit smoking about 12 years ago. His smoking use included cigarettes. He has never used smokeless tobacco. He reports current alcohol use. He reports that he does not use drugs.  No Known Allergies  Family History  Problem Relation Age of Onset  . Other Mother        died in her sleep at 33  . Hypertension Sister   . Hyperlipidemia Sister   . Diabetes Mellitus II Sister   . Heart disease Sister   . CAD Neg Hx        neg hx premature CAD     Prior to Admission medications   Medication Sig Start Date End Date Taking? Authorizing Provider  aspirin EC 81 MG tablet Take 81 mg by mouth daily. Swallow whole.   Yes [provider]  carvedilol (COREG) 3.125 MG tablet Take 3.125 mg by mouth 2 (two) times daily with a meal.   Yes [provider]  torsemide (DEMADEX) 20 MG tablet Take 40 mg by mouth daily.   Yes [provider]  alum & mag hydroxide-simeth  (MAALOX/MYLANTA) 200-200-20 MG/5ML suspension Take 15 mLs by mouth every 4 (four) hours as needed for indigestion or heartburn. Patient not taking: No sig reported 07/08/19   Elie Confer, MD  carvedilol (COREG) 3.125 MG tablet Take 1 tablet (3.125 mg total) by mouth 2 (two) times daily. 07/08/19 07/07/20  Elie Confer, MD  doxycycline (MONODOX) 100 MG capsule Take 100 mg by mouth 2 (two) times daily. 10/09/20   [provider]  feeding supplement, ENSURE ENLIVE, (ENSURE ENLIVE) LIQD Take 237 mLs by mouth 2 (two) times daily between meals. Patient not taking: No sig reported 07/09/19   Elie Confer, MD  pantoprazole (PROTONIX) 40 MG tablet Take 1 tablet (40 mg total) by mouth 2 (two) times daily before a meal. Patient not taking: Reported on 10/11/2020 07/09/19   Elie Confer, MD  torsemide (DEMADEX) 20 MG tablet Take 1 tablet (20 mg total) by mouth daily. 05/22/20 06/21/20  Nita Sells, MD    Physical Exam: Vitals:   10/11/20 1830 10/11/20 1900 10/11/20 1926 10/11/20 1930  BP: 120/81 132/77 132/77 114/74  Pulse:  93   Resp: 19 15 (!) 24 (!) 21  Temp:      TempSrc:      SpO2:   100%   Weight:      Height:        Constitutional: Acute alert and oriented x3, patient is in mild respiratory distress.   Skin: Dressings of the bilateral lower extremities are clean dry and intact.  No rashes, no lesions, good skin turgor noted. Eyes: Pupils are equally reactive to light.  No evidence of scleral icterus or conjunctival pallor.  ENMT: Moist mucous membranes noted.  Posterior pharynx clear of any exudate or lesions.   Neck: normal, supple, no masses, no thyromegaly.  Severe jugular venous distention noted at 45 degrees.   Respiratory: Notable bibasilar rales.  Notable intermittent expiratory wheezing bilaterally.  Clear to auscultation bilaterally, no wheezing, no crackles. Normal respiratory effort. No accessory muscle use.  Cardiovascular: Regular rate and rhythm,  no murmurs / rubs / gallops.  Significant bilateral lower extremity pitting edema that tracks up from the distal lower extremities all the way up to the lower back.. 2+ pedal pulses. No carotid bruits.  Chest:   Nontender without crepitus or deformity.   Back:   Nontender without crepitus or deformity. Abdomen: Markedly protuberant abdomen with evidence of shifting dullness and fluid wave consistent with ascites.  Abdomen is soft and nontender.  No evidence of intra-abdominal masses.  Positive bowel sounds noted in all quadrants.   Musculoskeletal: As mentioned in the skin examination dressings of the bilateral lower extremities are clean dry and intact.  No joint deformity upper and lower extremities. Good ROM, no contractures. Normal muscle tone.  Neurologic: CN 2-12 grossly intact. Sensation intact.  Patient moving all 4 extremities spontaneously.  Patient is following all commands.  Patient is responsive to verbal stimuli.   Psychiatric: Patient exhibits normal mood with appropriate affect.  Patient seems to possess insight as to their current situation.     Labs on Admission: I have personally reviewed following labs and imaging studies -   CBC: Recent Labs  Lab 10/11/20 1606  WBC 4.0  HGB 9.1*  HCT 30.0*  MCV 90.4  PLT 371   Basic Metabolic Panel: Recent Labs  Lab 10/11/20 1606  NA 137  K 4.0  CL 100  CO2 26  GLUCOSE 114*  BUN 57*  CREATININE 2.34*  CALCIUM 8.5*  MG 2.3   GFR: Estimated Creatinine Clearance: 28.2 mL/min (A) (by C-G formula based on SCr of 2.34 mg/dL (H)). Liver Function Tests: Recent Labs  Lab 10/11/20 1940  AST 16  ALT 8  ALKPHOS 54  BILITOT 1.4*  PROT 8.7*  ALBUMIN 2.9*   No results for input(s): LIPASE, AMYLASE in the last 168 hours. No results for input(s): AMMONIA in the last 168 hours. Coagulation Profile: Recent Labs  Lab 10/11/20 1606  INR 1.3*   Cardiac Enzymes: No results for input(s): CKTOTAL, CKMB, CKMBINDEX, TROPONINI in the  last 168 hours. BNP (last 3 results) No results for input(s): PROBNP in the last 8760 hours. HbA1C: No results for input(s): HGBA1C in the last 72 hours. CBG: No results for input(s): GLUCAP in the last 168 hours. Lipid Profile: No results for input(s): CHOL, HDL, LDLCALC, TRIG, CHOLHDL, LDLDIRECT in the last 72 hours. Thyroid Function Tests: No results for input(s): TSH, T4TOTAL, FREET4, T3FREE, THYROIDAB in the last 72 hours. Anemia Panel: No results for input(s): VITAMINB12, FOLATE, FERRITIN, TIBC, IRON, RETICCTPCT in the last 72 hours. Urine  analysis:    Component Value Date/Time   COLORURINE AMBER (A) 06/21/2019 0916   APPEARANCEUR CLOUDY (A) 06/21/2019 0916   LABSPEC 1.020 06/21/2019 0916   PHURINE 5.0 06/21/2019 0916   GLUCOSEU NEGATIVE 06/21/2019 0916   HGBUR MODERATE (A) 06/21/2019 0916   BILIRUBINUR SMALL (A) 06/21/2019 0916   KETONESUR NEGATIVE 06/21/2019 0916   PROTEINUR 30 (A) 06/21/2019 0916   NITRITE NEGATIVE 06/21/2019 0916   LEUKOCYTESUR MODERATE (A) 06/21/2019 0916    Radiological Exams on Admission - Personally Reviewed: DG Chest Portable 1 View  Result Date: 10/11/2020 CLINICAL DATA:  Shortness of breath EXAM: PORTABLE CHEST 1 VIEW COMPARISON:  May 15, 2020 FINDINGS: There is cardiomegaly with pulmonary vascular normal. There is subtle perihilar opacity on the right. There is a small granuloma in the left mid lung. The lungs elsewhere are clear. No appreciable adenopathy. No bone lesions. IMPRESSION: 1. Ill-defined perihilar opacity on the right, suspicious for focal pneumonia in this area. Lungs otherwise clear except for small calcified granuloma left mid lung. 2.  Generalized cardiomegaly. Electronically Signed   By: Lowella Grip III M.D.   On: 10/11/2020 15:56    EKG: Personally reviewed.  Rhythm is sinus rhythm with heart rate of 93 bpm.  No dynamic ST segment changes appreciated.  Assessment/Plan Principal Problem:  Multifactorial volume  overload secondary to both right heart failure, acute on chronic systolic and diastolic left heart failure in the setting of progressive ascites due to decompensated cirrhosis   Patient is presenting with extensive physical exam findings of volume overload including an extremely protuberant abdomen with significant ascites, severe jugular venous distention , and evidence of extensive bilateral lower extremity pitting edema that tracks all the way up to the mid back.  Patient is being administered 80 mg of intravenous Lasix, will observe for clinical response and continue patient on Lasix twice daily for now.  Ideally, in the presence of cirrhosis spironolactone would be ideal however it is essentially contraindicated with patient's advanced renal disease  Ordering IR paracentesis for the morning -I anticipate a large volume paracentesis and will likely need to order albumin afterwards depending on how much is taken off.  Combination of effective diuresis with intravenous Lasix and a large volume tap should dramatically improve patient shortness of breath.  Echocardiogram ordered  Strict input and output monitoring  Monitoring renal function and electrolytes with serial chemistries  This patient may benefit from a heart failure team consultation based on clinical course  Active Problems:   Cirrhosis of liver with ascites [facility])   Patient denies ever being told that he had cirrhosis of the liver.  There is evidence of cirrhosis of the liver on abdominal imaging that goes all the way back to 07/2019  Extensive ascites is likely contributing in large part to patient's substantial shortness of breath  IR guided paracentesis has been ordered -I anticipate a large volume tap and therefore albumin will likely need to be ordered based on how much is taken off  Maintenance diuretics for this going forward will prove difficult due to patient's advanced renal disease as it limits the use of  spironolactone  Etiology of cirrhosis is likely right heart failure in this patient.  Hepatitis panel pending    Essential hypertension   Continue home regimen of Coreg  As needed intravenous antihypertensives for markedly elevated blood pressures.    CKD (chronic kidney disease), stage IV (HCC)   Longstanding substantial chronic kidney disease  Considering the longstanding chronicity of this  renal disease this is more likely secondary to progressive hypertensive nephropathy and not related to his liver disease  Strict input and output monitoring  Monitoring renal function and electrolytes with serial chemistries  Attempting to limit nephrotoxic agents as much as possible    GERD without esophagitis   Continue home regimen of PPI    BPH with obstruction/lower urinary tract symptoms   Patient has a longstanding indwelling Foley catheter due to outlet obstruction secondary to severe BPH  Foley catheter is in place   Code Status:  Full code Family Communication: deferred   Status is: Observation  The patient remains OBS appropriate and will d/c before 2 midnights.  Dispo: The patient is from: SNF              Anticipated d/c is to: SNF              Anticipated d/c date is: 2 days              Patient currently is not medically stable to d/c.   Difficult to place patient No        Vernelle Emerald MD [facility] Pager [phone]  If 7PM-7AM, please contact night-coverage [email or web address] Use universal [place] password for that web site. If you do not have the password, please call the hospital operator.  10/11/2020, 8:26 PM

## 2020-10-11 NOTE — ED Triage Notes (Signed)
Patient reports swelling to abdomen with SOB x1 month. Reports sent by wound center for further evaluation. Seen at wound center for reported wounds to bilateral legs.

## 2020-10-12 ENCOUNTER — Observation Stay (HOSPITAL_COMMUNITY): Payer: Medicare Other

## 2020-10-12 DIAGNOSIS — F1020 Alcohol dependence, uncomplicated: Secondary | ICD-10-CM | POA: Diagnosis present

## 2020-10-12 DIAGNOSIS — N184 Chronic kidney disease, stage 4 (severe): Secondary | ICD-10-CM | POA: Diagnosis present

## 2020-10-12 DIAGNOSIS — I130 Hypertensive heart and chronic kidney disease with heart failure and stage 1 through stage 4 chronic kidney disease, or unspecified chronic kidney disease: Secondary | ICD-10-CM | POA: Diagnosis present

## 2020-10-12 DIAGNOSIS — K7460 Unspecified cirrhosis of liver: Secondary | ICD-10-CM | POA: Diagnosis present

## 2020-10-12 DIAGNOSIS — U071 COVID-19: Secondary | ICD-10-CM | POA: Diagnosis present

## 2020-10-12 DIAGNOSIS — J9601 Acute respiratory failure with hypoxia: Secondary | ICD-10-CM | POA: Diagnosis present

## 2020-10-12 DIAGNOSIS — N179 Acute kidney failure, unspecified: Secondary | ICD-10-CM | POA: Diagnosis present

## 2020-10-12 DIAGNOSIS — N138 Other obstructive and reflux uropathy: Secondary | ICD-10-CM | POA: Diagnosis present

## 2020-10-12 DIAGNOSIS — I472 Ventricular tachycardia: Secondary | ICD-10-CM | POA: Diagnosis present

## 2020-10-12 DIAGNOSIS — E1122 Type 2 diabetes mellitus with diabetic chronic kidney disease: Secondary | ICD-10-CM | POA: Diagnosis present

## 2020-10-12 DIAGNOSIS — R918 Other nonspecific abnormal finding of lung field: Secondary | ICD-10-CM | POA: Diagnosis present

## 2020-10-12 DIAGNOSIS — Z86718 Personal history of other venous thrombosis and embolism: Secondary | ICD-10-CM | POA: Diagnosis not present

## 2020-10-12 DIAGNOSIS — I5082 Biventricular heart failure: Secondary | ICD-10-CM | POA: Diagnosis present

## 2020-10-12 DIAGNOSIS — E8770 Fluid overload, unspecified: Secondary | ICD-10-CM | POA: Diagnosis present

## 2020-10-12 DIAGNOSIS — Z8719 Personal history of other diseases of the digestive system: Secondary | ICD-10-CM | POA: Diagnosis not present

## 2020-10-12 DIAGNOSIS — I5023 Acute on chronic systolic (congestive) heart failure: Secondary | ICD-10-CM | POA: Diagnosis not present

## 2020-10-12 DIAGNOSIS — K219 Gastro-esophageal reflux disease without esophagitis: Secondary | ICD-10-CM | POA: Diagnosis present

## 2020-10-12 DIAGNOSIS — E782 Mixed hyperlipidemia: Secondary | ICD-10-CM | POA: Diagnosis present

## 2020-10-12 DIAGNOSIS — E1151 Type 2 diabetes mellitus with diabetic peripheral angiopathy without gangrene: Secondary | ICD-10-CM | POA: Diagnosis present

## 2020-10-12 DIAGNOSIS — Z87891 Personal history of nicotine dependence: Secondary | ICD-10-CM | POA: Diagnosis not present

## 2020-10-12 DIAGNOSIS — Z9114 Patient's other noncompliance with medication regimen: Secondary | ICD-10-CM | POA: Diagnosis not present

## 2020-10-12 DIAGNOSIS — I236 Thrombosis of atrium, auricular appendage, and ventricle as current complications following acute myocardial infarction: Secondary | ICD-10-CM | POA: Diagnosis not present

## 2020-10-12 DIAGNOSIS — D539 Nutritional anemia, unspecified: Secondary | ICD-10-CM | POA: Diagnosis present

## 2020-10-12 DIAGNOSIS — R0602 Shortness of breath: Secondary | ICD-10-CM | POA: Diagnosis present

## 2020-10-12 DIAGNOSIS — I5022 Chronic systolic (congestive) heart failure: Secondary | ICD-10-CM | POA: Diagnosis not present

## 2020-10-12 DIAGNOSIS — I5043 Acute on chronic combined systolic (congestive) and diastolic (congestive) heart failure: Secondary | ICD-10-CM | POA: Diagnosis present

## 2020-10-12 DIAGNOSIS — I248 Other forms of acute ischemic heart disease: Secondary | ICD-10-CM | POA: Diagnosis present

## 2020-10-12 DIAGNOSIS — N401 Enlarged prostate with lower urinary tract symptoms: Secondary | ICD-10-CM | POA: Diagnosis present

## 2020-10-12 DIAGNOSIS — I429 Cardiomyopathy, unspecified: Secondary | ICD-10-CM | POA: Diagnosis not present

## 2020-10-12 DIAGNOSIS — R188 Other ascites: Secondary | ICD-10-CM | POA: Diagnosis present

## 2020-10-12 LAB — ECHOCARDIOGRAM COMPLETE
Area-P 1/2: 3.31 cm2
Calc EF: 4.9 %
Height: 71 in
MV M vel: 4.43 m/s
MV Peak grad: 78.5 mmHg
Radius: 0.5 cm
S' Lateral: 6.5 cm
Single Plane A2C EF: 4 %
Single Plane A4C EF: 7.3 %
Weight: 3146.41 oz

## 2020-10-12 LAB — CBC WITH DIFFERENTIAL/PLATELET
Abs Immature Granulocytes: 0.02 10*3/uL (ref 0.00–0.07)
Basophils Absolute: 0 10*3/uL (ref 0.0–0.1)
Basophils Relative: 1 %
Eosinophils Absolute: 0.1 10*3/uL (ref 0.0–0.5)
Eosinophils Relative: 2 %
HCT: 28.2 % — ABNORMAL LOW (ref 39.0–52.0)
Hemoglobin: 8.6 g/dL — ABNORMAL LOW (ref 13.0–17.0)
Immature Granulocytes: 1 %
Lymphocytes Relative: 6 %
Lymphs Abs: 0.2 10*3/uL — ABNORMAL LOW (ref 0.7–4.0)
MCH: 27.8 pg (ref 26.0–34.0)
MCHC: 30.5 g/dL (ref 30.0–36.0)
MCV: 91.3 fL (ref 80.0–100.0)
Monocytes Absolute: 0.2 10*3/uL (ref 0.1–1.0)
Monocytes Relative: 4 %
Neutro Abs: 3.6 10*3/uL (ref 1.7–7.7)
Neutrophils Relative %: 86 %
Platelets: 235 10*3/uL (ref 150–400)
RBC: 3.09 MIL/uL — ABNORMAL LOW (ref 4.22–5.81)
RDW: 18.6 % — ABNORMAL HIGH (ref 11.5–15.5)
WBC: 4.1 10*3/uL (ref 4.0–10.5)
nRBC: 0 % (ref 0.0–0.2)

## 2020-10-12 LAB — COMPREHENSIVE METABOLIC PANEL
ALT: 7 U/L (ref 0–44)
AST: 14 U/L — ABNORMAL LOW (ref 15–41)
Albumin: 2.7 g/dL — ABNORMAL LOW (ref 3.5–5.0)
Alkaline Phosphatase: 46 U/L (ref 38–126)
Anion gap: 11 (ref 5–15)
BUN: 57 mg/dL — ABNORMAL HIGH (ref 8–23)
CO2: 27 mmol/L (ref 22–32)
Calcium: 8.4 mg/dL — ABNORMAL LOW (ref 8.9–10.3)
Chloride: 101 mmol/L (ref 98–111)
Creatinine, Ser: 2.41 mg/dL — ABNORMAL HIGH (ref 0.61–1.24)
GFR, Estimated: 27 mL/min — ABNORMAL LOW (ref >60)
Glucose, Bld: 107 mg/dL — ABNORMAL HIGH (ref 70–99)
Potassium: 4.2 mmol/L (ref 3.5–5.1)
Sodium: 139 mmol/L (ref 135–145)
Total Bilirubin: 1.7 mg/dL — ABNORMAL HIGH (ref 0.3–1.2)
Total Protein: 7.8 g/dL (ref 6.5–8.1)

## 2020-10-12 LAB — HEMOGLOBIN A1C
Hgb A1c MFr Bld: 4.7 % — ABNORMAL LOW (ref 4.8–5.6)
Mean Plasma Glucose: 88.19 mg/dL

## 2020-10-12 LAB — HEPATITIS PANEL, ACUTE
HCV Ab: NONREACTIVE
Hep A IgM: NONREACTIVE
Hep B C IgM: NONREACTIVE
Hepatitis B Surface Ag: NONREACTIVE

## 2020-10-12 LAB — APTT: aPTT: 36 seconds (ref 24–36)

## 2020-10-12 LAB — SARS CORONAVIRUS 2 (TAT 6-24 HRS): SARS Coronavirus 2: POSITIVE — AB

## 2020-10-12 LAB — MAGNESIUM: Magnesium: 2.1 mg/dL (ref 1.7–2.4)

## 2020-10-12 MED ORDER — CHLORHEXIDINE GLUCONATE CLOTH 2 % EX PADS
6.0000 | MEDICATED_PAD | Freq: Every day | CUTANEOUS | Status: DC
  Administered 2020-10-12 – 2020-10-15 (×4): 6 via TOPICAL

## 2020-10-12 MED ORDER — FUROSEMIDE 10 MG/ML IJ SOLN
40.0000 mg | Freq: Every day | INTRAMUSCULAR | Status: DC
  Administered 2020-10-13: 40 mg via INTRAVENOUS
  Filled 2020-10-12: qty 4

## 2020-10-12 NOTE — Plan of Care (Signed)

## 2020-10-12 NOTE — Progress Notes (Signed)
  Echocardiogram 2D Echocardiogram has been performed.  [name] 10/12/2020, 8:42 AM

## 2020-10-12 NOTE — Progress Notes (Signed)
PROGRESS NOTE  [name]  DOB: 1941-06-12  PCP: [name], MD XBJ:[id number]  DOA: 10/11/2020  LOS: 0 days   Chief Complaint  Patient presents with  . Edema   Brief narrative: [name] is a 80 y.o. 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 Patient presented to the ED on 2/12 with complaint of worsening shortness of breath, bilateral pedal edema and abdominal girth since her last 1 month. Patient states he quit alcohol several months ago and quit smoking several years ago.  He has chronic bilateral lower extremity edema and follows up at wound care center.  He recently learned that he has liver cirrhosis.  Never required paracentesis. Currently lives at home with his wife..  Was able to ambulate with a walker until few weeks ago Reports compliance to his usual medications including Demadex.  In the ED, patient was afebrile, heart rate 103, blood pressure in 100s, breathing comfortably in room air. Labs with creatinine elevated 2.34, BNP elevated to more than 4000, hemoglobin low at 9.1, liver enzymes normal, acute hepatitis panel negative Covid PCR positive Chest x-ray with ill-defined perihilar opacity on the right, suspicious for focal pneumonia in this area.  In the ED, patient was given 80 mg of IV Lasix. Admitted to hospitalist service for further evaluation management.    Subjective: Patient was seen and examined this morning.  Pleasant elderly African-American male.  Lying on bed.  Not in distress.  Not on supplemental oxygen. He has compression bandage on both lower extremities.  Says he follows up at wound care center for chronic wound.  Has wrinkles on both lower extremities suggestive of  improving edema but significantly distended abdomen due to ascites. Patient states he quit alcohol several months ago.  Quit smoking several years ago. Currently lives at home with his wife..  Was able to ambulate with a walker until few weeks ago Remains hemodynamically stable, on room air Labs with creatinine slightly worse at 2.41, hemoglobin further down at 8.6 this morning.  Assessment/Plan: Progressive volume overload -Presented with worsening shortness of breath, bilateral pedal edema, abdominal girth -Multifactorial: Liver cirrhosis, severe systolic CHF, dietary indiscretion, suspect noncompliance to diuresis. -See details below  Acute exacerbation of chronic CHF Essential hypertension -Last echocardiogram from October 2020 with EF less than 20%.  Repeat echo ordered. -Given Lasix 80 mg IV in the ED.  Currently on 60 mg IV twice daily. -Home meds include Coreg 3.125 mg twice daily, torsemide 40 mg daily. -Continue Coreg and IV Lasix 60 mg twice daily.  I do not think overall patient is hypervolemic except for ascites.  I would reduce Lasix dose to 40 mg daily. -Net IO Since Admission: -300 mL [10/12/20 1122] -Continue to monitor for daily intake output, weight, blood pressure, BNP, renal function and electrolytes. Recent Labs  Lab 10/11/20 1606 10/12/20 0421  BNP 4,154.2*  --   BUN 57* 57*  CREATININE 2.34* 2.41*  K 4.0 4.2  MG 2.3 2.1   Liver cirrhosis -Unclear etiology.  History of chronic alcoholism and also has severe right ventricular failure.  Acute hepatitis panel nonreactive. -Liver ultrasound from 09/29/2020 confirmed liver cirrhosis, also showed ascites. -Currently on beta-blocker and IV Lasix.  Will consider Aldactone as tolerated by blood pressure prior  to discharge. -Ultrasound paracentesis ordered.  Chronic alcoholism -Watch out for withdrawal symptoms  Diabetes mellitus -Seems controlled.  A1c 4.7 on 2/13. -Not on diabetes meds at home.  Watch out for  hypoglycemia  CKD stage IV -Baseline creatinine less than 2.5 -Currently at baseline.  At risk of worsening because he needs aggressive diuresis.  Will plan less aggressive diuresis.  Continue to monitor.  Peripheral artery disease/hyperlipidemia -Not on statin.  Presumably due to liver cirrhosis.  H/o LV thrombus -not on anticoagulation because of multiple episodes of GI bleeding.  Chronic anemia  H/O GI bleeding in the past GERD -Continue PPI -Not on anticoagulation.  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  Impaired mobility -We will benefit from PT eval.  Mobility: Encourage ambulation.  PT eval ordered Code Status:   Code Status: Full Code  Nutritional status: Body mass index is 27.43 kg/m.     Diet Order            Diet Heart Room service appropriate? No; Fluid consistency: Thin  Diet effective now                DVT prophylaxis: enoxaparin (LOVENOX) injection 30 mg Start: 10/11/20 2200   Antimicrobials:  None Fluid: None Consultants: None Family Communication:  None at bedside  Status is: Observation  The patient will require care spanning > 2 midnights and should be moved to inpatient because: Ongoing diagnostic testing needed not appropriate for outpatient work up  Dispo: The patient is from: Home              Anticipated d/c is to: Home most likely, pending PT eval              Anticipated d/c date is: 3 days              Patient currently is not medically stable to d/c.   Difficult to place patient No       Infusions:  . sodium chloride      Scheduled Meds: . aspirin EC  81 mg Oral Daily  . carvedilol  3.125 mg  Oral BID  . Chlorhexidine Gluconate Cloth  6 each Topical Daily  . enoxaparin (LOVENOX) injection  30 mg Subcutaneous Q24H  . [START ON 10/13/2020] furosemide  40 mg Intravenous Daily  . sodium chloride flush  3 mL Intravenous Q12H    Antimicrobials: Anti-infectives (From admission, onward)   None      PRN meds: sodium chloride, acetaminophen, ondansetron (ZOFRAN) IV, polyethylene glycol, sodium chloride flush   Objective: Vitals:   10/12/20 0250 10/12/20 0433  BP:  114/78  Pulse:  75  Resp:  18  Temp: 97.6 F (36.4 C) 97.6 F (36.4 C)  SpO2:  100%    Intake/Output Summary (Last 24 hours) at 10/12/2020 1122 Last data filed at 10/12/2020 0530 Gross per 24 hour  Intake -  Output 300 ml  Net -300 ml   Filed Weights   10/11/20 1607 10/12/20 0211 10/12/20 0433  Weight: 86.2 kg 88.6 kg 89.2 kg   Weight change:  Body mass index is 27.43 kg/m.   Physical Exam: General exam: Pleasant,  elderly African-American male.  Not in distress at this time Skin: No rashes, lesions or ulcers. HEENT: Atraumatic, normocephalic, no obvious bleeding Lungs: Clear to auscultation bilaterally CVS: Regular rate and rhythm, no murmur GI/Abd soft, distended from ascites, nontender, bowel sound present CNS: Alert, awake, oriented x3 Psychiatry: Mood appropriate Extremities: Chronic bilateral lower extremity wounds, compression bandages on.  Wrinkles seen in the thighs  Data Review: I have personally reviewed the laboratory data and studies available.  Recent Labs  Lab 10/11/20 1606 10/12/20 0421  WBC 4.0 4.1  NEUTROABS  --  3.6  HGB 9.1* 8.6*  HCT 30.0* 28.2*  MCV 90.4 91.3  PLT 256 235   Recent Labs  Lab 10/11/20 1606 10/12/20 0421  NA 137 139  K 4.0 4.2  CL 100 101  CO2 26 27  GLUCOSE 114* 107*  BUN 57* 57*  CREATININE 2.34* 2.41*  CALCIUM 8.5* 8.4*  MG 2.3 2.1   F/u labs ordered.  Unresulted Labs (From admission, onward)          Start     Ordered   10/13/20 3276   Basic metabolic panel  Daily,   R      10/12/20 1115   10/13/20 0500  CBC with Differential/Platelet  Daily,   R      10/12/20 1116   10/13/20 0500  Magnesium  Tomorrow morning,   STAT        10/12/20 1116         Signed, Terrilee Croak, MD [facility] 10/12/2020

## 2020-10-12 NOTE — Plan of Care (Signed)

## 2020-10-13 ENCOUNTER — Inpatient Hospital Stay (HOSPITAL_COMMUNITY): Payer: Medicare Other

## 2020-10-13 ENCOUNTER — Other Ambulatory Visit (HOSPITAL_COMMUNITY): Payer: Medicare Other

## 2020-10-13 DIAGNOSIS — I5023 Acute on chronic systolic (congestive) heart failure: Secondary | ICD-10-CM

## 2020-10-13 DIAGNOSIS — I429 Cardiomyopathy, unspecified: Secondary | ICD-10-CM

## 2020-10-13 DIAGNOSIS — I5043 Acute on chronic combined systolic (congestive) and diastolic (congestive) heart failure: Secondary | ICD-10-CM | POA: Diagnosis not present

## 2020-10-13 LAB — BASIC METABOLIC PANEL
Anion gap: 12 (ref 5–15)
BUN: 63 mg/dL — ABNORMAL HIGH (ref 8–23)
CO2: 26 mmol/L (ref 22–32)
Calcium: 8.5 mg/dL — ABNORMAL LOW (ref 8.9–10.3)
Chloride: 99 mmol/L (ref 98–111)
Creatinine, Ser: 2.6 mg/dL — ABNORMAL HIGH (ref 0.61–1.24)
GFR, Estimated: 25 mL/min — ABNORMAL LOW (ref >60)
Glucose, Bld: 111 mg/dL — ABNORMAL HIGH (ref 70–99)
Potassium: 4.1 mmol/L (ref 3.5–5.1)
Sodium: 137 mmol/L (ref 135–145)

## 2020-10-13 LAB — CBC WITH DIFFERENTIAL/PLATELET
Abs Immature Granulocytes: 0.01 10*3/uL (ref 0.00–0.07)
Basophils Absolute: 0 10*3/uL (ref 0.0–0.1)
Basophils Relative: 1 %
Eosinophils Absolute: 0.1 10*3/uL (ref 0.0–0.5)
Eosinophils Relative: 3 %
HCT: 27.4 % — ABNORMAL LOW (ref 39.0–52.0)
Hemoglobin: 8.4 g/dL — ABNORMAL LOW (ref 13.0–17.0)
Immature Granulocytes: 0 %
Lymphocytes Relative: 9 %
Lymphs Abs: 0.4 10*3/uL — ABNORMAL LOW (ref 0.7–4.0)
MCH: 28.2 pg (ref 26.0–34.0)
MCHC: 30.7 g/dL (ref 30.0–36.0)
MCV: 91.9 fL (ref 80.0–100.0)
Monocytes Absolute: 0.2 10*3/uL (ref 0.1–1.0)
Monocytes Relative: 5 %
Neutro Abs: 3.3 10*3/uL (ref 1.7–7.7)
Neutrophils Relative %: 82 %
Platelets: 254 10*3/uL (ref 150–400)
RBC: 2.98 MIL/uL — ABNORMAL LOW (ref 4.22–5.81)
RDW: 18.4 % — ABNORMAL HIGH (ref 11.5–15.5)
WBC: 4 10*3/uL (ref 4.0–10.5)
nRBC: 0 % (ref 0.0–0.2)

## 2020-10-13 LAB — AEROBIC CULTURE W GRAM STAIN (SUPERFICIAL SPECIMEN)

## 2020-10-13 LAB — MAGNESIUM: Magnesium: 2.1 mg/dL (ref 1.7–2.4)

## 2020-10-13 MED ORDER — ALBUMIN HUMAN 25 % IV SOLN
25.0000 g | Freq: Once | INTRAVENOUS | Status: AC
  Administered 2020-10-13: 25 g via INTRAVENOUS
  Filled 2020-10-13: qty 100

## 2020-10-13 MED ORDER — ISOSORBIDE MONONITRATE ER 30 MG PO TB24
15.0000 mg | ORAL_TABLET | Freq: Every day | ORAL | Status: DC
  Administered 2020-10-13 – 2020-10-15 (×3): 15 mg via ORAL
  Filled 2020-10-13 (×3): qty 1

## 2020-10-13 MED ORDER — LIDOCAINE HCL 1 % IJ SOLN
INTRAMUSCULAR | Status: AC
  Filled 2020-10-13: qty 20

## 2020-10-13 MED ORDER — HEPARIN (PORCINE) 25000 UT/250ML-% IV SOLN
1500.0000 [IU]/h | INTRAVENOUS | Status: DC
  Administered 2020-10-13: 1200 [IU]/h via INTRAVENOUS
  Administered 2020-10-14: 1500 [IU]/h via INTRAVENOUS
  Filled 2020-10-13 (×2): qty 250

## 2020-10-13 MED ORDER — HYDRALAZINE HCL 10 MG PO TABS
10.0000 mg | ORAL_TABLET | Freq: Three times a day (TID) | ORAL | Status: DC
  Administered 2020-10-13 – 2020-10-14 (×4): 10 mg via ORAL
  Filled 2020-10-13 (×5): qty 1

## 2020-10-13 MED ORDER — FUROSEMIDE 10 MG/ML IJ SOLN
80.0000 mg | Freq: Two times a day (BID) | INTRAMUSCULAR | Status: DC
  Administered 2020-10-13 – 2020-10-14 (×2): 80 mg via INTRAVENOUS
  Filled 2020-10-13 (×2): qty 8

## 2020-10-13 MED ORDER — HEPARIN BOLUS VIA INFUSION
4000.0000 [IU] | Freq: Once | INTRAVENOUS | Status: AC
  Administered 2020-10-13: 4000 [IU] via INTRAVENOUS
  Filled 2020-10-13: qty 4000

## 2020-10-13 NOTE — Plan of Care (Signed)

## 2020-10-13 NOTE — Evaluation (Addendum)
Physical Therapy Evaluation Patient Details Name: [name] MRN: [id number] DOB: 05-30-1941 Today's Date: 10/13/2020   History of Present Illness  80 y.o. 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  Patient presented to the ED on 2/12 with complaint of worsening shortness of breath, bilateral pedal edema and abdominal girth since her last 1 month.  Pt admitted for progressive volume overload and Acute exacerbation of chronic CHF  Clinical Impression  Pt admitted with above diagnosis.  Pt currently with functional limitations due to the deficits listed below (see PT Problem List). Pt will benefit from skilled PT to increase their independence and safety with mobility to allow discharge to the venue listed below.  Pt ambulated in room however required seated rest break.  Pt reports difficulty with mobility due to dyspnea for the last few weeks.  Pt states his son assists with IADLs.  Pt may benefit from HHPT and aide if possible upon d/c.     Follow Up Recommendations Home health PT    Equipment Recommendations  W/C   Recommendations for Other Services       Precautions / Restrictions Precautions Precautions: Fall      Mobility  Bed Mobility               General bed mobility comments: pt up in recliner on arrival    Transfers Overall transfer level: Needs assistance Equipment used: Rolling walker (2 wheeled) Transfers: Sit to/from Stand Sit to Stand: Min guard         General transfer comment: min/guard for safety, increased time and effort  Ambulation/Gait Ambulation/Gait assistance: Min guard Gait Distance (Feet): 10 Feet (x2) Assistive device: Rolling walker (2 wheeled) Gait Pattern/deviations:  Step-through pattern;Decreased stride length     General Gait Details: pt fatigues quickly and reports dyspnea however Spo2 97-100% on room air, required seated rest break 10'x2  Stairs            Wheelchair Mobility    Modified Rankin (Stroke Patients Only)       Balance Overall balance assessment: Mild deficits observed, not formally tested                                           Pertinent Vitals/Pain Pain Assessment: No/denies pain    Home Living Family/patient expects to be discharged to:: Private residence Living Arrangements: Alone Available Help at Discharge: Family;Available PRN/intermittently Type of Home: Apartment Home Access: Level entry     Home Layout: One level Home Equipment: Walker - 2 wheels;Cane - single point      Prior Function Level of Independence: Independent with assistive device(s)         Comments: pt reports using RW, mostly only ambulating to/from bathroom due to fatigue and dyspnea, uses [employer] for transportation, reports increased time for ADLs, son assists with IADLs     Hand Dominance        Extremity/Trunk Assessment        Lower Extremity Assessment Lower Extremity Assessment: Generalized weakness    Cervical / Trunk Assessment Cervical / Trunk Assessment: Normal  Communication   Communication: No difficulties  Cognition Arousal/Alertness: Awake/alert Behavior During Therapy:  WFL for tasks assessed/performed Overall Cognitive Status: Within Functional Limits for tasks assessed                                        General Comments      Exercises     Assessment/Plan    PT Assessment Patient needs continued PT services  PT Problem List Decreased strength;Decreased mobility;Decreased activity tolerance;Decreased knowledge of use of DME;Decreased balance;Cardiopulmonary status limiting activity       PT Treatment Interventions DME instruction;Gait training;Balance  training;Therapeutic exercise;Functional mobility training;Therapeutic activities;Patient/family education    PT Goals (Current goals can be found in the Care Plan section)  Acute Rehab PT Goals PT Goal Formulation: With patient Time For Goal Achievement: 10/27/20 Potential to Achieve Goals: Good    Frequency Min 3X/week   Barriers to discharge        Co-evaluation               AM-PAC PT "6 Clicks" Mobility  Outcome Measure Help needed turning from your back to your side while in a flat bed without using bedrails?: A Little Help needed moving from lying on your back to sitting on the side of a flat bed without using bedrails?: A Little Help needed moving to and from a bed to a chair (including a wheelchair)?: A Little Help needed standing up from a chair using your arms (e.g., wheelchair or bedside chair)?: A Little Help needed to walk in hospital room?: A Little Help needed climbing 3-5 steps with a railing? : A Lot 6 Click Score: 17    End of Session Equipment Utilized During Treatment: Gait belt Activity Tolerance: Patient limited by fatigue Patient left: in chair;with call bell/phone within reach   PT Visit Diagnosis: Difficulty in walking, not elsewhere classified (R26.2)    Time: 2409-7353 PT Time Calculation (min) (ACUTE ONLY): 21 min   Charges:   PT Evaluation $PT Eval Low Complexity: 1 Low         Kati PT, DPT Acute Rehabilitation Services Pager: [phone] Office: [phone]   [name] 10/13/2020, 12:58 PM

## 2020-10-13 NOTE — Procedures (Signed)
PROCEDURE SUMMARY:  Successful image-guided paracentesis from the left lower abdomen.  Yielded 9.4 liters of yellow cloudy fluid.  No immediate complications.  EBL = trace. Patient tolerated well.   Specimen was not sent for labs. Patient will receive albumin post procedure.   Please see imaging section of Epic for full dictation.   Armando Gang Caridad Silveira PA-C 10/13/2020 3:42 PM

## 2020-10-13 NOTE — Consult Note (Signed)
Cardiology Consultation:   Patient ID: [name] MRN: [id number]; DOB: 01-Apr-1941  Admit date: 10/11/2020 Date of Consult: 10/13/2020  PCP:  [name], MD   [place]  Cardiologist:  Candee Furbish, MD  Advanced Practice Provider:  No care team member to display Electrophysiologist:  Thompson Grayer, MD    Patient Profile:   [name] is a 80 y.o. 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 Pietro Cassis.  History of Present Illness:   [name] is a 80 year old male with the above history who is followed by Dr. Marlou Porch. Patient has a long history of dilated cardiomyopathy dating back to at least 2013. Patient was admitted to [facility] in June 2016 for acute on chronic systolic CHF and elevated troponin felt to be demand ischemia. Echo showed LVEF of 25-30%. There was concern for medication non-compliance at the time. Cardiac catheterization was offered but patient declined. He preferred to follow-up with his Cardiologist in [place]. The decision was reportedly made to treat him medically at follow-up visit. Alcohol abuse has been felt to be the cause of his cardiomyopathy.  He was admitted to [facility] again in 10/2017 for severe abdominal pain and was treated for cholecystitis with percutaneous cholecystostomy. Cholecystectomy was recommended but patient declined. He was admitted in [facility] November 2020 for cholangitis. ERCP was performed and stone was removed. Hospital course was complicated by acute biventricular  heart failure and AKI. He was seen by Advanced Heart Failure team as well as Nephrology. He was started on Dobutamine drip. Echo during admission showed LVEF of <20% with small fixed LV thrombus. RV was also severely enlarged with severely reduced systolic function and flattened interventricular septum in diastole consistent with RV volume overload. He was ultimately started on Coumadin for his LV thrombus. However, he did have a drop in his hemoglobin after this requiring transfusion. He was started on GDMT with instruction to follow-up in [facility] but never did.  Patient admitted in 04/2020 with acute GI bleed with hemoglobin as low as 5.8. He received a total of 4 units of PRBCs that admission. EGD showed non-bleedings ulcers. Patient reportedly was not on Coumadin at that time. He was advised to follow-up with Cardiology after discharge to discuss need for this but never did.  Patient follows at outpatient wound care for lower extremity wound. Wound care provider recently ordered BNP and abdominal ultrasound. BNP was elevated in the 2,000 on 09/26/2020 and abdominal ultrasound showed morphologic features of the liver concerning for cirrhosis as well as an indeterminate solid echogenic structure in right hepatic lobe.  Patient was sent to the ED by Wound Care provider on 10/11/2020 for futher evaluation abdominal swelling and shortness of breath x 1 month.   In the ED, vitals stable. EKG showed showed normal sinus rhythm with Q waves in V1-V3 and mild T wave inversions in lead aVL. No acute changes from prior tracings. BNP markedly elevated at 4,154. Chest x-ray showed ill defined perihilar opacity on the right suspicious for focal pneumonia in this area. CBC  4.0, Hgb 9.1, Plts 256. Na 137, K 4.0, Glucose 114, BUN 57, Cr 2.34. Albumin 2.9. Total Bili and Direct Bili elevated at 1.4 to 2.1, respectively, but otherwise LFTs normal. Hepatitis panel negative. COVID testing came back  positive. Patint was noted to be substantially volume overloaded with markedly protuberant abdomen concerning for ascites. He was started on IV Lasix and admitted. Volume overload felt to be multifactorial given advanced CHF and now cirrhosis of the liver. IR paracentesis has been ordered. Echo showed LVEF of <20% with global hypokinesis , grade 2 diastolic dysfunction, and LV thrombus. There was smoke seen in the LV consistent with low-flow, low output state. RV mildly to moderately enlarged with severely reduced systolic function and moderately elevated PASP. Mild to moderate MR and mild to moderate TR also noted. Cardiology consulted for further evaluation.   At the time of this evaluation, patient is sitting in chair in no acute distress. He states he follows with Dr. Brigitte Pulse at [facility] for his cardiac care. He reports he last saw Dr. Brigitte Pulse about 3-4 months. He is a difficult historian and has trouble providing a lot of details. He reports he has "swelling everywhere" but mainly in his abdomen. He cannot tell me exactly when it started. He also reports shortness of breath both at rest and activity. He describes orthopnea. However, he also has trouble laying flat at night due to reflux. Unclear whether he has had true PND or not. He denies chest pain. No palpitations, lightheadedness, dizziness, or syncope. He has significantly abdominal distension and a poor appetite but denies any abdominal pain, nausea, or vomiting. No recent fevers or illnesses. He denies any abnormal bleeding in stools (melena or hematochezia since last hospitalization in 04/2020. No other abnormal bleeding.   Past Medical History:  Diagnosis Date  . Acute cholecystitis 11/23/2017  . Acute GI bleeding 06/30/2019  . Acute respiratory failure with hypoxia ([place])   . Acute upper back pain   . AKI (acute kidney injury) ([place])   . Alcohol abuse 02/26/2015  . Anemia, deficiency   . Back pain    LUMBOSACRAL  . Bronchitis,  complicated   . Cardiogenic shock ([place])   . Cardiomegaly   . Cholangitis due to bile duct calculus with obstruction   . Chronic combined systolic and diastolic CHF (congestive heart failure) ([place])   . Chronic pain    HIGH RISK MED USE COMPREHENSIVE HIGH RISK  . CKD (chronic kidney disease), stage III ([place])   . Demand ischemia ([place])    a. minimally elevated troponin peak 0.11 in 2016, felt demand ischemia. Cath offered but pt wished to have done back in [place].  . Diabetes mellitus without complication ([place])   . Edema, leg   . Elevated troponin 02/26/2015  . Essential hypertension   . Fatigue   . Heart abnormality   . Hyperlipidemia   . Hypertension   . Neck pain, acute   . PVD (peripheral vascular disease) ([place])   . Renal lesion 11/23/2017  . SOB (shortness of breath) on exertion   . Solitary kidney   . Vitamin D deficiency     Past Surgical History:  Procedure Laterality Date  . BIOPSY  05/16/2020   Procedure: BIOPSY;  Surgeon: Carol Ada, MD;  Location: [facility] ENDOSCOPY;  Service: Endoscopy;;  . ENDOSCOPIC RETROGRADE CHOLANGIOPANCREATOGRAPHY (ERCP) WITH PROPOFOL N/A 06/19/2019   Procedure: ENDOSCOPIC RETROGRADE CHOLANGIOPANCREATOGRAPHY (ERCP) WITH PROPOFOL;  Surgeon: Milus Banister, MD;  Location: [facility] ENDOSCOPY;  Service: Endoscopy;  Laterality: N/A;  . ESOPHAGOGASTRODUODENOSCOPY (EGD) WITH PROPOFOL N/A 07/01/2019   Procedure: ESOPHAGOGASTRODUODENOSCOPY (EGD) WITH PROPOFOL;  Surgeon: Rush Landmark Telford Nab., MD;  Location: [place];  Service: Gastroenterology;  Laterality: N/A;  . ESOPHAGOGASTRODUODENOSCOPY (EGD) WITH PROPOFOL N/A 05/16/2020   Procedure: ESOPHAGOGASTRODUODENOSCOPY (EGD) WITH PROPOFOL;  Surgeon: Carol Ada, MD;  Location: [facility] ENDOSCOPY;  Service: Endoscopy;  Laterality: N/A;  . HOT HEMOSTASIS N/A 05/16/2020   Procedure: HOT HEMOSTASIS (ARGON PLASMA COAGULATION/BICAP);  Surgeon: Carol Ada, MD;  Location: [facility] ENDOSCOPY;  Service: Endoscopy;  Laterality: N/A;  . IR  CHOLANGIOGRAM EXISTING TUBE  12/27/2017  . IR PERC CHOLECYSTOSTOMY  11/25/2017  . IR RADIOLOGIST EVAL & MGMT  02/02/2018  . REMOVAL OF STONES  06/19/2019   Procedure: REMOVAL OF STONES;  Surgeon: Milus Banister, MD;  Location: [facility] ENDOSCOPY;  Service: Endoscopy;;  . Joan Mayans  06/19/2019   Procedure: Joan Mayans;  Surgeon: Milus Banister, MD;  Location: [facility] ENDOSCOPY;  Service: Endoscopy;;     Home Medications:  Prior to Admission medications   Medication Sig Start Date End Date Taking? Authorizing Provider  aspirin EC 81 MG tablet Take 81 mg by mouth daily. Swallow whole.   Yes [provider]  carvedilol (COREG) 3.125 MG tablet Take 3.125 mg by mouth 2 (two) times daily with a meal.   Yes [provider]  torsemide (DEMADEX) 20 MG tablet Take 40 mg by mouth daily.   Yes [provider]  alum & mag hydroxide-simeth (MAALOX/MYLANTA) 200-200-20 MG/5ML suspension Take 15 mLs by mouth every 4 (four) hours as needed for indigestion or heartburn. Patient not taking: No sig reported 07/08/19   Elie Confer, MD  carvedilol (COREG) 3.125 MG tablet Take 1 tablet (3.125 mg total) by mouth 2 (two) times daily. 07/08/19 07/07/20  Elie Confer, MD  doxycycline (MONODOX) 100 MG capsule Take 100 mg by mouth 2 (two) times daily. 10/09/20   [provider]  feeding supplement, ENSURE ENLIVE, (ENSURE ENLIVE) LIQD Take 237 mLs by mouth 2 (two) times daily between meals. Patient not taking: No sig reported 07/09/19   Elie Confer, MD  pantoprazole (PROTONIX) 40 MG tablet Take 1 tablet (40 mg total) by mouth 2 (two) times daily before a meal. Patient not taking: Reported on 10/11/2020 07/09/19   Elie Confer, MD  torsemide (DEMADEX) 20 MG tablet Take 1 tablet (20 mg total) by mouth daily. 05/22/20 06/21/20  Nita Sells, MD    Inpatient Medications: Scheduled Meds: . aspirin EC  81 mg Oral Daily  . carvedilol  3.125 mg Oral BID  . Chlorhexidine  Gluconate Cloth  6 each Topical Daily  . enoxaparin (LOVENOX) injection  30 mg Subcutaneous Q24H  . furosemide  40 mg Intravenous Daily  . sodium chloride flush  3 mL Intravenous Q12H   Continuous Infusions: . sodium chloride     PRN Meds: sodium chloride, acetaminophen, ondansetron (ZOFRAN) IV, polyethylene glycol, sodium chloride flush  Allergies:   No Known Allergies  Social History:   Social History   Socioeconomic History  . Marital status: Single    Spouse name: Not on file  . Number of children: Not on file  . Years of education: Not on file  . Highest education level: Not on file  Occupational History  . Not on file  Tobacco Use  . Smoking status: Former Smoker    Types: Cigarettes    Quit date: 02/26/2008    Years since quitting: 12.6  . Smokeless tobacco: Never  Used  Vaping Use  . Vaping Use: Never used  Substance and Sexual Activity  . Alcohol use: Yes    Comment: occ  . Drug use: No  . Sexual activity: Not on file  Other Topics Concern  . Not on file  Social History Narrative  . Not on file   Social Determinants of Health   Financial Resource Strain: Not on file  Food Insecurity: Not on file  Transportation Needs: Not on file  Physical Activity: Not on file  Stress: Not on file  Social Connections: Not on file  Intimate Partner Violence: Not on file    Family History:   Family History  Problem Relation Age of Onset  . Other Mother        died in her sleep at 93  . Hypertension Sister   . Hyperlipidemia Sister   . Diabetes Mellitus II Sister   . Heart disease Sister   . CAD Neg Hx        neg hx premature CAD     ROS:  Please see the history of present illness.  All other ROS reviewed and negative.     Physical Exam/Data:   Vitals:   10/12/20 0433 10/12/20 1143 10/12/20 2051 10/13/20 0519  BP: 114/78 113/75 121/75 121/83  Pulse: 75 80 90 84  Resp: 18 18 20 18   Temp: 97.6 F (36.4 C)  (!) 97.5 F (36.4 C) (!) 97.5 F (36.4 C)   TempSrc: Rectal  Oral Oral  SpO2: 100% 100% 100% 100%  Weight: 89.2 kg   90 kg  Height:        Intake/Output Summary (Last 24 hours) at 10/13/2020 1009 Last data filed at 10/13/2020 0500 Gross per 24 hour  Intake 240 ml  Output 1650 ml  Net -1410 ml   Last 3 Weights 10/13/2020 10/12/2020 10/12/2020  Weight (lbs) 198 lb 6.6 oz 196 lb 10.4 oz 195 lb 5.2 oz  Weight (kg) 90 kg 89.2 kg 88.6 kg  Some encounter information is confidential and restricted. Go to Review Flowsheets activity to see all data.     Body mass index is 27.67 kg/m.  General: 80 y.o. male resting comfortably in no acute distress. HEENT: Normocephalic and atraumatic. Sclera clear.  Neck: Supple. JVD elevated to ear. Heart: RRR. Distinct S1 and S2. Possible soft systolic murmur. No gallops or rubs. Radial pulses 2+ and equal bilaterally. Lungs: No increased work of breathing. Mildly decreased breath sounds with mild crackles in right base. Abdomen: Firm and markedly distended but non-tender. Bowel sounds present in all 4 quadrants. Extremities: Trace lower extremity edema in bilateral upper and lower extremities.   Skin: Warm and dry. Neuro: Alert and oriented x3. No focal deficits. Psych: Normal affect. Responds appropriately.  EKG:  The EKG was personally reviewed and demonstrates: Normal sinus rhythm, rate 93 bpm, with possible Q waves in V1-V3 and mild T wave inversions in aVL. No acute changes from prior tracing.   Telemetry:  Telemetry was personally reviewed and demonstrates: Normal sinus rhythm with rates in the 80's to 90's. Occasional PVCs as well as short runs of NSVT (longest run about 13 beats).  Relevant CV Studies:  Echocardiogram 10/12/2020: Impressions: 1. Left ventricular ejection fraction, by estimation, is <20%. The left  ventricle has severely decreased function. The left ventricle demonstrates  global hypokinesis.  2. The left ventricular internal cavity size was severely dilated.  3. Left  ventricular diastolic parameters are consistent with Grade II  diastolic dysfunction (  pseudonormalization).  4. A LV thrombus adherent to the LV apex is visualized and measures  0.63x0.89cm (best seen on clip 52).  5. Right ventricular systolic function is severely reduced. The right  ventricular size is mildly-to-moderately enlarged.  6. There is moderately elevated pulmonary artery systolic pressure.  7. Left atrial size was severely dilated.  8. Right atrial size was severely dilated.  9. The mitral valve is grossly normal. Mild to moderate mitral valve  regurgitation.  10. Suspect at least mild-to-moderate, eccentric tricuspid valve  regurgitation. Likely funcitonal in the setting of annular dilation.  11. The aortic valve is tricuspid. There is mild calcification of the  aortic valve. There is mild thickening of the aortic valve. Aortic valve  regurgitation is trivial. Mild aortic valve sclerosis is present, with no  evidence of aortic valve stenosis.  12. There is smoke seen in the LV consistent with a low-flow, low--output  state   Comparison(s): Compared to prior TTE 06/29/19, the RV size has improved  with no septal bowing suggestive of improved RV pressures. Otherwise,  there is no significant change.   Laboratory Data:  High Sensitivity Troponin:  No results for input(s): TROPONINIHS in the last 720 hours.   Chemistry Recent Labs  Lab 10/11/20 1606 10/12/20 0421 10/13/20 0221  NA 137 139 137  K 4.0 4.2 4.1  CL 100 101 99  CO2 26 27 26   GLUCOSE 114* 107* 111*  BUN 57* 57* 63*  CREATININE 2.34* 2.41* 2.60*  CALCIUM 8.5* 8.4* 8.5*  GFRNONAA 28* 27* 25*  ANIONGAP 11 11 12     Recent Labs  Lab 10/11/20 1940 10/12/20 0421  PROT 8.7* 7.8  ALBUMIN 2.9* 2.7*  AST 16 14*  ALT 8 7  ALKPHOS 54 46  BILITOT 1.4* 1.7*   Hematology Recent Labs  Lab 10/11/20 1606 10/12/20 0421 10/13/20 0221  WBC 4.0 4.1 4.0  RBC 3.32* 3.09* 2.98*  HGB 9.1* 8.6* 8.4*  HCT  30.0* 28.2* 27.4*  MCV 90.4 91.3 91.9  MCH 27.4 27.8 28.2  MCHC 30.3 30.5 30.7  RDW 18.6* 18.6* 18.4*  PLT 256 235 254   BNP Recent Labs  Lab 10/11/20 1606  BNP 4,154.2*    DDimer No results for input(s): DDIMER in the last 168 hours.   Radiology/Studies:  DG Chest Portable 1 View  Result Date: 10/11/2020 CLINICAL DATA:  Shortness of breath EXAM: PORTABLE CHEST 1 VIEW COMPARISON:  May 15, 2020 FINDINGS: There is cardiomegaly with pulmonary vascular normal. There is subtle perihilar opacity on the right. There is a small granuloma in the left mid lung. The lungs elsewhere are clear. No appreciable adenopathy. No bone lesions. IMPRESSION: 1. Ill-defined perihilar opacity on the right, suspicious for focal pneumonia in this area. Lungs otherwise clear except for small calcified granuloma left mid lung. 2.  Generalized cardiomegaly. Electronically Signed   By: Lowella Grip III M.D.   On: 10/11/2020 15:56   ECHOCARDIOGRAM COMPLETE  Result Date: 10/12/2020    ECHOCARDIOGRAM REPORT   Patient Name:   [name] Date of Exam: 10/12/2020 Medical Rec #:  [id number]        Height:       71.0 in Accession #:    [id number]       Weight:       196.6 lb Date of Birth:  02-Aug-1941        BSA:          2.093 m Patient Age:    72 years  BP:           114/78 mmHg Patient Gender: M                HR:           103 bpm. Exam Location:  Inpatient Procedure: 2D Echo, 3D Echo, Cardiac Doppler and Color Doppler Indications:    I50.40* Unspecified combined systolic (congestive) and diastolic                 (congestive) heart failure  History:        Patient has prior history of Echocardiogram examinations, most                 recent 06/22/2019. Cardiomyopathy and CHF,                 Signs/Symptoms:Shortness of Breath, Dyspnea and Chest Pain; Risk                 Factors:Hypertension, Diabetes and Dyslipidemia. Covid positive.                 ETOH.  Sonographer:    Roseanna Rainbow RDCS Referring Phys:  [id number] [place]  1. Left ventricular ejection fraction, by estimation, is <20%. The left ventricle has severely decreased function. The left ventricle demonstrates global hypokinesis.  2. The left ventricular internal cavity size was severely dilated.  3. Left ventricular diastolic parameters are consistent with Grade II diastolic dysfunction (pseudonormalization).  4. A LV thrombus adherent to the LV apex is visualized and measures 0.63x0.89cm (best seen on clip 52).  5. Right ventricular systolic function is severely reduced. The right ventricular size is mildly-to-moderately enlarged.  6. There is moderately elevated pulmonary artery systolic pressure.  7. Left atrial size was severely dilated.  8. Right atrial size was severely dilated.  9. The mitral valve is grossly normal. Mild to moderate mitral valve regurgitation. 10. Suspect at least mild-to-moderate, eccentric tricuspid valve regurgitation. Likely funcitonal in the setting of annular dilation. 11. The aortic valve is tricuspid. There is mild calcification of the aortic valve. There is mild thickening of the aortic valve. Aortic valve regurgitation is trivial. Mild aortic valve sclerosis is present, with no evidence of aortic valve stenosis. 12. There is smoke seen in the LV consistent with a low-flow, low--output state Comparison(s): Compared to prior TTE 06/29/19, the RV size has improved with no septal bowing suggestive of improved RV pressures. Otherwise, there is no significant change. FINDINGS  Left Ventricle: A LV thrombus adherent to the LV apex is visualize dand measures 0.63x0.89cm (best seen on clip 52). Left ventricular ejection fraction, by estimation, is <20%. The left ventricle has severely decreased function. The left ventricle demonstrates global hypokinesis. The left ventricular internal cavity size was severely dilated. There is no left ventricular hypertrophy. Left ventricular diastolic parameters are consistent  with Grade II diastolic dysfunction (pseudonormalization). Right Ventricle: The right ventricular size is mildly-to-moderately enlarged. No increase in right ventricular wall thickness. Right ventricular systolic function is severely reduced. There is moderately elevated pulmonary artery systolic pressure. The tricuspid regurgitant velocity is 2.76 m/s, and with an assumed right atrial pressure of 15 mmHg, the estimated right ventricular systolic pressure is 67.3 mmHg. Left Atrium: Left atrial size was severely dilated. Right Atrium: Right atrial size was severely dilated. Pericardium: There is no evidence of pericardial effusion. Mitral Valve: The mitral valve is grossly normal. There is mild thickening of the mitral valve leaflet(s). Mild to moderate mitral valve  regurgitation. Tricuspid Valve: The tricuspid valve is normal in structure. Tricuspid valve regurgitation suspect moderate, highly eccentric tricuspid regurgitation. Aortic Valve: The aortic valve is tricuspid. There is mild calcification of the aortic valve. There is mild thickening of the aortic valve. Aortic valve regurgitation is trivial. Mild aortic valve sclerosis is present, with no evidence of aortic valve stenosis. Pulmonic Valve: The pulmonic valve was normal in structure. Pulmonic valve regurgitation is mild. Aorta: The aortic root is normal in size and structure. IAS/Shunts: No atrial level shunt detected by color flow Doppler. Additional Comments: There is a small pleural effusion in the left lateral region.  LEFT VENTRICLE PLAX 2D LVIDd:         6.80 cm      Diastology LVIDs:         6.50 cm      LV e' medial:    3.26 cm/s LV PW:         1.50 cm      LV E/e' medial:  23.4 LV IVS:        1.30 cm      LV e' lateral:   5.87 cm/s LVOT diam:     2.00 cm      LV E/e' lateral: 13.0 LV SV:         44 LV SV Index:   21 LVOT Area:     3.14 cm  LV Volumes (MOD) LV vol d, MOD A2C: 275.0 ml LV vol d, MOD A4C: 287.0 ml LV vol s, MOD A2C: 264.0 ml LV vol  s, MOD A4C: 266.0 ml LV SV MOD A2C:     11.0 ml LV SV MOD A4C:     287.0 ml LV SV MOD BP:      13.8 ml RIGHT VENTRICLE            IVC RV S prime:     7.72 cm/s  IVC diam: 2.80 cm LEFT ATRIUM              Index       RIGHT ATRIUM           Index LA diam:        5.00 cm  2.39 cm/m  RA Area:     37.80 cm LA Vol (A2C):   117.0 ml 55.89 ml/m RA Volume:   211.00 ml 100.79 ml/m LA Vol (A4C):   82.1 ml  39.22 ml/m LA Biplane Vol: 100.0 ml 47.77 ml/m  AORTIC VALVE             PULMONIC VALVE LVOT Vmax:   80.20 cm/s  PR End Diast Vel: 2.39 msec LVOT Vmean:  60.300 cm/s LVOT VTI:    0.141 m  AORTA Ao Root diam: 3.80 cm Ao Asc diam:  3.50 cm MITRAL VALVE                 TRICUSPID VALVE MV Area (PHT): 3.31 cm      TR Peak grad:   30.5 mmHg MV Decel Time: 229 msec      TR Vmax:        276.00 cm/s MR Peak grad:    78.5 mmHg MR Mean grad:    51.0 mmHg   SHUNTS MR Vmax:         443.00 cm/s Systemic VTI:  0.14 m MR Vmean:        341.0 cm/s  Systemic Diam: 2.00 cm MR PISA:         1.57  cm MR PISA Eff ROA: 13 mm MR PISA Radius:  0.50 cm MV E velocity: 76.20 cm/s MV A velocity: 50.90 cm/s MV E/A ratio:  1.50 Gwyndolyn Kaufman MD Electronically signed by Gwyndolyn Kaufman MD Signature Date/Time: 10/12/2020/11:51:10 AM    Final      Assessment and Plan:   Acute on Chronic Biventricular Heart Failure - Patient presented with worsening abdominal distension and shortness of breath. - BNP markedly elevated in the 4,000's. showed ill defined perihilar opacity on the right suspicious for focal pneumonia in this area. - Echo showed LVEF of <20% with global hypokinesis , grade 2 diastolic dysfunction, and LV thrombus. There was smoke seen in the LV consistent with low-flow, low output state. RV mildly to moderately enlarged with severely reduced systolic function and moderately elevated PASP. Mild to moderate MR and mild to moderate TR also noted.  - Will increase IV Lasix to 80mg  twice daily. - Continue low dose Coreg for now  but will need to be cautious given concern for low output. - No ACE/ARB/ARNI or MRA due to renal function. - Will try to add Hydralazine 10mg  TID and Imdur 15mg  daily. Hopefully BP will tolerate this. - Paracentesis has been ordered. - He is not a candidate for any advanced CHF therapies. - Continue to monitor daily weights, strict I/O's, and renal function.  LV Thrombus - First noted on Echo in 05/2019. Echo this admission showed possible thrombus adherent to LV apex.  - Patient previously on Coumadin but had recurrent GI bleeds. Has not been on any anticoagulation. - Will recheck limited Echo with contrast to better assess possible thrombus before deciding on whether or not to retry anticoagulation.  Non-Sustained VT - Short runs of NSVT noted on telemetry. Longest run 13 beats. - Continue low dose Coreg for now.   Hypertension - BP currently well controlled. - Continue medication for CHF as above.  CKD Stage IV - Creatinine 2.4 on admission and 2.60 today. Baseline around 2.0 to 2.5.  - Possibly due to renal congestion. - Continue to monitor with diuresis.  Otherwise, per primary team: - Cirrhosis of the liver - Low Albumin - Chronic lower extremity wound - Type 2 diabetes mellitus  - GERD - Anemia: Hemoglobin stable    Risk Assessment/Risk Scores:   New York Heart Association (NYHA) Functional Class NYHA Class III-IV   For questions or updates, please contact [facility] Please consult [email or web address] for contact info under    Signed, Darreld Mclean, PA-C  10/13/2020 10:09 AM

## 2020-10-13 NOTE — Progress Notes (Signed)
PROGRESS NOTE  [name]  DOB: 1940-09-22  PCP: [name], MD DUK:[id number]  DOA: 10/11/2020  LOS: 1 day   Chief Complaint  Patient presents with  . Edema   Brief narrative: [name] is a 80 y.o. 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 Patient presented to the ED on 2/12 with complaint of worsening shortness of breath, bilateral pedal edema and abdominal girth since her last 1 month. Patient states he quit alcohol several months ago and quit smoking several years ago.  He has chronic bilateral lower extremity edema and follows up at wound care center.  He recently learned that he has liver cirrhosis.  Never required paracentesis. Currently lives at home with his wife..  Was able to ambulate with a walker until few weeks ago Reports compliance to his usual medications including Demadex.  In the ED, patient was afebrile, heart rate 103, blood pressure in 100s, breathing comfortably in room air. Labs with creatinine elevated 2.34, BNP elevated to more than 4000, hemoglobin low at 9.1, liver enzymes normal, acute hepatitis panel negative Covid PCR positive Chest x-ray with ill-defined perihilar opacity on the right, suspicious for focal pneumonia in this area.  In the ED, patient was given 80 mg of IV Lasix. Admitted to hospitalist service for further evaluation management.    Subjective: Patient was seen and examined this morning.  Pleasant elderly African-American male.  Lying on bed.  Not in distress.  Currently on low-flow oxygen  Assessment/Plan: Progressive volume overload -Presented with worsening shortness of breath, bilateral pedal edema, abdominal girth -Multifactorial: Liver cirrhosis,  severe systolic CHF, dietary indiscretion, suspect noncompliance to diuresis. -See details below  Acute exacerbation of chronic CHF Essential hypertension -Presented with shortness of breath, improving with diuresis, currently on Lasix 40 mg IV daily.   -Echocardiogram 2/13 with EF less than 20%, severe right ventricular failure and grade 2 diastolic dysfunction along with an adherent LV thrombus. -Also continue Coreg 3.125 mg twice daily. -Cardiology service consult appreciated.  Increased Lasix to 80 mg IV twice daily.  Also added low-dose Imdur and hydralazine. -Net IO Since Admission: -1,790 mL [10/13/20 1314] -Continue to monitor for daily intake output, weight, blood pressure, BNP, renal function and electrolytes. Recent Labs  Lab 10/11/20 1606 10/12/20 0421 10/13/20 0221  BNP 4,154.2*  --   --   BUN 57* 57* 63*  CREATININE 2.34* 2.41* 2.60*  K 4.0 4.2 4.1  MG 2.3 2.1 2.1   Liver cirrhosis -Unclear etiology.  History of chronic alcoholism and also has severe right ventricular failure.  Acute hepatitis panel nonreactive. -Liver ultrasound from 09/29/2020 confirmed liver cirrhosis, also showed ascites. -Currently on beta-blocker and IV Lasix.  Will consider Aldactone as tolerated by blood pressure prior to discharge. -Ultrasound paracentesis ordered.  Pending  Chronic alcoholism -Watch out for withdrawal symptoms  Diabetes mellitus -Seems controlled.  A1c 4.7 on 2/13. -Not on diabetes meds at home.  Watch out for hypoglycemia  CKD stage IV -Baseline creatinine less than 2.5 -Currently at baseline.  At risk of worsening because he needs aggressive diuresis.  Continue to monitor Recent Labs    05/16/20 0555 05/17/20 1115 05/19/20 0408 05/20/20 0453 05/21/20 0616 05/22/20 0832 09/26/20 1035 10/11/20 1606 10/12/20 0421 10/13/20 0221  BUN  88* 71* 67* 63* 57* 55* 67* 57* 57* 63*  CREATININE 2.07* 2.09* 2.60* 2.54* 2.42* 2.07* 2.49* 2.34* 2.41* 2.60*   Peripheral  artery disease/hyperlipidemia -Not on statin.  Presumably due to liver cirrhosis.  H/o LV thrombus -not on anticoagulation because of multiple episodes of GI bleeding.  Chronic anemia  H/O GI bleeding in the past GERD -Continue PPI -Not on anticoagulation.  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  Impaired mobility -We will benefit from PT eval.  Mobility: Encourage ambulation.  PT eval ordered Code Status:   Code Status: Full Code  Nutritional status: Body mass index is 27.67 kg/m.     Diet Order            Diet Heart Room service appropriate? No; Fluid consistency: Thin  Diet effective now                DVT prophylaxis: enoxaparin (LOVENOX) injection 30 mg Start: 10/11/20 2200   Antimicrobials:  None Fluid: None Consultants: None Family Communication:  None at bedside  Status is: Observation  The patient will require care spanning > 2 midnights and should be moved to inpatient because: Ongoing diagnostic testing needed not appropriate for outpatient work up  Dispo: The patient is from: Home              Anticipated d/c is to: Home most likely, pending PT eval              Anticipated d/c date is: 3 days              Patient currently is not medically stable to d/c.   Difficult to place patient No       Infusions:  . sodium chloride      Scheduled Meds: . aspirin EC  81 mg Oral Daily  . carvedilol  3.125 mg Oral BID  . Chlorhexidine Gluconate Cloth  6 each Topical Daily  . enoxaparin (LOVENOX) injection  30 mg Subcutaneous Q24H  . furosemide  80 mg Intravenous BID  . hydrALAZINE  10 mg Oral Q8H  . isosorbide mononitrate  15  mg Oral Daily  . sodium chloride flush  3 mL Intravenous Q12H    Antimicrobials: Anti-infectives (From admission, onward)   None      PRN meds: sodium chloride, acetaminophen, ondansetron (ZOFRAN) IV, polyethylene glycol, sodium chloride flush   Objective: Vitals:   10/13/20 0519 10/13/20 1210  BP: 121/83 100/72  Pulse: 84 74  Resp: 18 18  Temp: (!) 97.5 F (36.4 C) (!) 97.2 F (36.2 C)  SpO2: 100% 100%    Intake/Output Summary (Last 24 hours) at 10/13/2020 1314 Last data filed at 10/13/2020 1215 Gross per 24 hour  Intake 120 ml  Output 1500 ml  Net -1380 ml   Filed Weights   10/12/20 0211 10/12/20 0433 10/13/20 0519  Weight: 88.6 kg 89.2 kg 90 kg   Weight change: 3.816 kg Body mass index is 27.67 kg/m.   Physical Exam: General exam: Pleasant, elderly African-American male.  Not in distress at this time.  On low-flow oxygen Skin: No rashes, lesions or ulcers. HEENT: Atraumatic, normocephalic, no obvious bleeding Lungs:  Clear to auscultation bilaterally CVS: Regular rate and rhythm, no murmur GI/Abd soft, remains distended from ascites, nontender, bowel sound present CNS: Alert, awake, oriented x3 Psychiatry: Mood appropriate Extremities: Chronic bilateral lower extremity wounds, compression bandages on.  Wrinkles seen in the thighs  Data Review: I have personally reviewed the laboratory data and studies available.  Recent Labs  Lab 10/11/20 1606 10/12/20 0421 10/13/20 0221  WBC 4.0 4.1 4.0  NEUTROABS  --  3.6 3.3  HGB 9.1* 8.6* 8.4*  HCT 30.0* 28.2* 27.4*  MCV 90.4 91.3 91.9  PLT 256 235 254   Recent Labs  Lab 10/11/20 1606 10/12/20 0421 10/13/20 0221  NA 137 139 137  K 4.0 4.2 4.1  CL 100 101 99  CO2 26 27 26   GLUCOSE 114* 107* 111*  BUN 57* 57* 63*  CREATININE 2.34* 2.41* 2.60*  CALCIUM 8.5* 8.4* 8.5*  MG 2.3 2.1 2.1   F/u labs ordered.  Unresulted Labs (From admission, onward)          Start     Ordered   10/13/20 9373  Basic  metabolic panel  Daily,   R      10/12/20 1115   10/13/20 0500  CBC with Differential/Platelet  Daily,   R      10/12/20 1116         Signed, Terrilee Croak, MD [facility] 10/13/2020

## 2020-10-13 NOTE — Progress Notes (Signed)
[facility] for heparin Indication: LV thrombus  No Known Allergies  Patient Measurements: Height: 5\' 11"  (180.3 cm) Weight: 90 kg (198 lb 6.6 oz) IBW/kg (Calculated) : 75.3 Heparin Dosing Weight: 90 kg  Vital Signs: Temp: 97.2 F (36.2 C) (02/14 1210) Temp Source: Oral (02/14 1210) BP: 100/72 (02/14 1210) Pulse Rate: 74 (02/14 1210)  Labs: Recent Labs    10/11/20 1606 10/12/20 0421 10/13/20 0221  HGB 9.1* 8.6* 8.4*  HCT 30.0* 28.2* 27.4*  PLT 256 235 254  APTT  --  36  --   LABPROT 16.0*  --   --   INR 1.3*  --   --   CREATININE 2.34* 2.41* 2.60*    Estimated Creatinine Clearance: 25.3 mL/min (A) (by C-G formula based on SCr of 2.6 mg/dL (H)).  Assessment: Patient is a 80 y.o M with hx CKD, PVD, liver cirrhosis  and HF presented to the ED on 2/12 with c/o SOB. Echo on 2/13 showed a reduced EF and LV thrombus. Pharmacy has been consulted to dose heparin drip for LV thrombus.  Today, 10/13/2020: - hgb 8.4, plts 254K - scr up 2.60 (crcl~25) - last dose of lovenox 30mg  given on 2/13 at 2134  Goal of Therapy:  Heparin level 0.3-0.7 units/ml Monitor platelets by anticoagulation protocol: Yes   Plan:  - d/c lovenox 30 mg  - heparin 4000 units IV x1, then drip at 1200 units/hr - check 8 hr heparin level - monitor for s/sx bleeding  Lynelle Doctor 10/13/2020,2:09 PM

## 2020-10-13 NOTE — ED Provider Notes (Signed)
[facility] FLOOR PROGRESSIVE CARE AND UROLOGY Provider Note   CSN: [id number] Arrival date & time: 10/11/20  1344     History Chief Complaint  Patient presents with  . Edema    Teegan Strozier is a 80 y.o. male.  HPI     80 y.o. 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 Patient presented to the ED with complaint of worsening shortness of breath, bilateral pedal edema and abdominal girth since her last 1 month.  He shortness of breath has worsened more recently.  Patient is not able to carry on with already restricted ADLs without getting winded.  He sits up in the middle night and feels better with cold air.  He has a new cough.  Denies any fevers, chills.  Patient also has leg swelling however he does not think it is worse than usual. Additionally, patient also reports increased abdominal girth.  He has no severe abdominal pain.  He had abdominal paracentesis, there was a long time back.  Past Medical History:  Diagnosis Date  . Acute cholecystitis 11/23/2017  . Acute GI bleeding 06/30/2019  . Acute respiratory failure with hypoxia ([place])   . Acute upper back pain   . AKI (acute kidney injury) ([place])   . Alcohol abuse 02/26/2015  . Anemia, deficiency   . Back pain    LUMBOSACRAL  . Bronchitis, complicated   . Cardiogenic shock ([place])   . Cardiomegaly   . Cholangitis due to bile duct calculus with obstruction   . Chronic combined systolic and diastolic CHF (congestive heart failure) ([place])   . Chronic pain    HIGH RISK MED USE COMPREHENSIVE HIGH RISK  . CKD (chronic kidney disease), stage III ([place])   . Demand ischemia ([place])    a. minimally elevated troponin peak 0.11 in 2016, felt demand ischemia. Cath offered but  pt wished to have done back in [place].  . Diabetes mellitus without complication ([place])   . Edema, leg   . Elevated troponin 02/26/2015  . Essential hypertension   . Fatigue   . Heart abnormality   . Hyperlipidemia   . Hypertension   . Neck pain, acute   . PVD (peripheral vascular disease) ([place])   . Renal lesion 11/23/2017  . SOB (shortness of breath) on exertion   . Solitary kidney   . Vitamin D deficiency     Patient Active Problem List   Diagnosis Date Noted  . Volume overload 10/12/2020  . GERD without esophagitis 10/11/2020  . 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 Cirrhosis of liver with ascites ([place]) 10/11/2020  . Chronic systolic CHF (congestive heart failure) ([place])   . Symptomatic anemia   . Multiple gastric ulcers   . CKD (chronic kidney disease), stage IV ([place]) 05/15/2020  . Chest pain 05/15/2020  . Palliative care encounter   . Acute on chronic congestive heart failure ([place]) 02/04/2019  . Anemia of chronic disease 03/08/2018  . Acute cholecystitis 11/23/2017  . Renal lesion 11/23/2017  . Alcohol abuse 02/26/2015  . Mixed hyperlipidemia   . Diabetes mellitus without complication ([place])   . Acute on chronic combined systolic and diastolic CHF (congestive heart failure) ([place])   . Essential hypertension     Past Surgical History:  Procedure Laterality Date  . BIOPSY  05/16/2020   Procedure: BIOPSY;  Surgeon: Carol Ada, MD;  Location: [facility] ENDOSCOPY;  Service: Endoscopy;;  . ENDOSCOPIC RETROGRADE CHOLANGIOPANCREATOGRAPHY (ERCP) WITH PROPOFOL N/A 06/19/2019   Procedure: ENDOSCOPIC RETROGRADE CHOLANGIOPANCREATOGRAPHY (ERCP) WITH PROPOFOL;  Surgeon: Milus Banister, MD;  Location: [facility] ENDOSCOPY;  Service: Endoscopy;  Laterality: N/A;  . ESOPHAGOGASTRODUODENOSCOPY (EGD) WITH PROPOFOL N/A 07/01/2019   Procedure: ESOPHAGOGASTRODUODENOSCOPY (EGD) WITH PROPOFOL;  Surgeon: Rush Landmark Telford Nab., MD;  Location: [facility];  Service: Gastroenterology;   Laterality: N/A;  . ESOPHAGOGASTRODUODENOSCOPY (EGD) WITH PROPOFOL N/A 05/16/2020   Procedure: ESOPHAGOGASTRODUODENOSCOPY (EGD) WITH PROPOFOL;  Surgeon: Carol Ada, MD;  Location: [facility] ENDOSCOPY;  Service: Endoscopy;  Laterality: N/A;  . HOT HEMOSTASIS N/A 05/16/2020   Procedure: HOT HEMOSTASIS (ARGON PLASMA COAGULATION/BICAP);  Surgeon: Carol Ada, MD;  Location: [facility];  Service: Endoscopy;  Laterality: N/A;  . IR CHOLANGIOGRAM EXISTING TUBE  12/27/2017  . IR PERC CHOLECYSTOSTOMY  11/25/2017  . IR RADIOLOGIST EVAL & MGMT  02/02/2018  . REMOVAL OF STONES  06/19/2019   Procedure: REMOVAL OF STONES;  Surgeon: Milus Banister, MD;  Location[facility] END[facility]COPY;  Service: Endoscopy;;  . Joan Mayans  06/19/2019   Procedure: Joan Mayans;  Surgeon: Milus Banister, MD;  Location: [facility] ENDOSCOPY;  Service: Endoscopy;;       Family History  Problem Relation Age of Onset  . Other Mother        died in her sleep at 18  . Hypertension Sister   . Hyperlipidemia Sister   . Diabetes Mellitus II Sister   . Heart disease Sister   . CAD Neg Hx        neg hx premature CAD    Social History   Tobacco Use  . Smoking status: Former Smoker    Types: Cigarettes    Quit date: 02/26/2008    Years since quitting: 12.6  . Smokeless tobacco: Never Used  Vaping Use  . Vaping Use: Never used  Substance Use Topics  . Alcohol use: Yes    Comment: occ  . Drug use: No    Home Medications Prior to Admission medications   Medication Sig Start Date End Date Taking? Authorizing Provider  aspirin EC 81 MG tablet Take 81 mg by mouth daily. Swallow whole.   Yes [provider]  carvedilol (COREG) 3.125 MG tablet Take 3.125 mg by mouth 2 (two) times daily with a meal.   Yes [provider]  torsemide (DEMADEX) 20 MG tablet Take 40 mg by mouth daily.   Yes [provider]  alum & mag hydroxide-simeth (MAALOX/MYLANTA) 200-200-20 MG/5ML suspension Take 15 mLs by mouth every 4  (four) hours as needed for indigestion or heartburn. Patient not taking: No sig reported 07/08/19   Elie Confer, MD  carvedilol (COREG) 3.125 MG tablet Take 1 tablet (3.125 mg total) by mouth 2 (two) times daily. 07/08/19 07/07/20  Elie Confer, MD  doxycycline (MONODOX) 100 MG capsule Take 100 mg by mouth 2 (two) times daily. 10/09/20   [provider]  feeding supplement, ENSURE ENLIVE, (ENSURE ENLIVE) LIQD Take 237 mLs by mouth 2 (two) times daily between meals. Patient not taking: No sig reported 07/09/19   Elie Confer, MD  pantoprazole (PROTONIX) 40 MG tablet Take 1 tablet (40 mg total) by mouth 2 (two) times daily before a meal. Patient not taking: Reported on 10/11/2020 07/09/19   Elie Confer, MD  torsemide (DEMADEX) 20 MG tablet Take 1 tablet (  20 mg total) by mouth daily. 05/22/20 06/21/20  Nita Sells, MD    Allergies    Patient has no known allergies.  Review of Systems   Review of Systems  Constitutional: Positive for activity change.  Respiratory: Positive for shortness of breath.   Cardiovascular: Negative for chest pain.  Gastrointestinal: Positive for abdominal distention. Negative for nausea and vomiting.  Hematological: Does not bruise/bleed easily.  All other systems reviewed and are negative.   Physical Exam Updated Vital Signs BP 100/72 (BP Location: Left Arm)   Pulse 74   Temp (!) 97.2 F (36.2 C) (Oral)   Resp 18   Ht 5\' 11"  (1.803 m)   Wt 90 kg   SpO2 100%   BMI 27.67 kg/m   Physical Exam Vitals and nursing note reviewed.  Constitutional:      Appearance: He is well-developed.  HENT:     Head: Atraumatic.  Cardiovascular:     Rate and Rhythm: Normal rate.  Pulmonary:     Effort: Pulmonary effort is normal.     Breath sounds: Rales present.  Abdominal:     General: There is distension.     Tenderness: There is no abdominal tenderness.  Musculoskeletal:        General: No swelling or tenderness.     Cervical  back: Neck supple.  Skin:    General: Skin is warm.  Neurological:     Mental Status: He is alert and oriented to person, place, and time.     ED Results / Procedures / Treatments   Labs (all labs ordered are listed, but only abnormal results are displayed) Labs Reviewed  SARS CORONAVIRUS 2 (TAT 6-24 HRS) - Abnormal; Notable for the following components:      Result Value   SARS Coronavirus 2 POSITIVE (*)    All other components within normal limits  BASIC METABOLIC PANEL - Abnormal; Notable for the following components:   Glucose, Bld 114 (*)    BUN 57 (*)    Creatinine, Ser 2.34 (*)    Calcium 8.5 (*)    GFR, Estimated 28 (*)    All other components within normal limits  BRAIN NATRIURETIC PEPTIDE - Abnormal; Notable for the following components:   B Natriuretic Peptide 4,154.2 (*)    All other components within normal limits  CBC - Abnormal; Notable for the following components:   RBC 3.32 (*)    Hemoglobin 9.1 (*)    HCT 30.0 (*)    RDW 18.6 (*)    All other components within normal limits  PROTIME-INR - Abnormal; Notable for the following components:   Prothrombin Time 16.0 (*)    INR 1.3 (*)    All other components within normal limits  HEPATIC FUNCTION PANEL - Abnormal; Notable for the following components:   Total Protein 8.7 (*)    Albumin 2.9 (*)    Total Bilirubin 1.4 (*)    Bilirubin, Direct 0.6 (*)    All other components within normal limits  HEMOGLOBIN A1C - Abnormal; Notable for the following components:   Hgb A1c MFr Bld 4.7 (*)    All other components within normal limits  CBC WITH DIFFERENTIAL/PLATELET - Abnormal; Notable for the following components:   RBC 3.09 (*)    Hemoglobin 8.6 (*)    HCT 28.2 (*)    RDW 18.6 (*)    Lymphs Abs 0.2 (*)    All other components within normal limits  COMPREHENSIVE METABOLIC PANEL - Abnormal; Notable  for the following components:   Glucose, Bld 107 (*)    BUN 57 (*)    Creatinine, Ser 2.41 (*)    Calcium 8.4  (*)    Albumin 2.7 (*)    AST 14 (*)    Total Bilirubin 1.7 (*)    GFR, Estimated 27 (*)    All other components within normal limits  BASIC METABOLIC PANEL - Abnormal; Notable for the following components:   Glucose, Bld 111 (*)    BUN 63 (*)    Creatinine, Ser 2.60 (*)    Calcium 8.5 (*)    GFR, Estimated 25 (*)    All other components within normal limits  CBC WITH DIFFERENTIAL/PLATELET - Abnormal; Notable for the following components:   RBC 2.98 (*)    Hemoglobin 8.4 (*)    HCT 27.4 (*)    RDW 18.4 (*)    Lymphs Abs 0.4 (*)    All other components within normal limits  MAGNESIUM  HEPATITIS PANEL, ACUTE  APTT  MAGNESIUM  MAGNESIUM  HEPARIN LEVEL (UNFRACTIONATED)    EKG EKG Interpretation  Date/Time:  Saturday October 11 2020 15:27:02 EST Ventricular Rate:  93 PR Interval:    QRS Duration: 110 QT Interval:  397 QTC Calculation: 494 R Axis:   -60 Text Interpretation: Sinus rhythm Prolonged PR interval Left anterior fascicular block Anterior infarct, old Nonspecific T abnormalities, lateral leads No acute changes No significant change since last tracing Confirmed by Varney Biles [phone]) on 10/11/2020 5:14:30 PM   Radiology DG Chest Portable 1 View  Result Date: 10/11/2020 CLINICAL DATA:  Shortness of breath EXAM: PORTABLE CHEST 1 VIEW COMPARISON:  May 15, 2020 FINDINGS: There is cardiomegaly with pulmonary vascular normal. There is subtle perihilar opacity on the right. There is a small granuloma in the left mid lung. The lungs elsewhere are clear. No appreciable adenopathy. No bone lesions. IMPRESSION: 1. Ill-defined perihilar opacity on the right, suspicious for focal pneumonia in this area. Lungs otherwise clear except for small calcified granuloma left mid lung. 2.  Generalized cardiomegaly. Electronically Signed   By: Lowella Grip III M.D.   On: 10/11/2020 15:56   ECHOCARDIOGRAM COMPLETE  Result Date: 10/12/2020    ECHOCARDIOGRAM REPORT   Patient  Name:   KAIYON HYNES Date of Exam: 10/12/2020 Medical Rec #:  [id number]        Height:       71.0 in Accession #:    [id number]       Weight:       196.6 lb Date of Birth:  Jan 03, 1941        BSA:          2.093 m Patient Age:    47 years         BP:           114/78 mmHg Patient Gender: M                HR:           103 bpm. Exam Location:  Inpatient Procedure: 2D Echo, 3D Echo, Cardiac Doppler and Color Doppler Indications:    I50.40* Unspecified combined systolic (congestive) and diastolic                 (congestive) heart failure  History:        Patient has prior history of Echocardiogram examinations, most                 recent 06/22/2019. Cardiomyopathy  and CHF,                 Signs/Symptoms:Shortness of Breath, Dyspnea and Chest Pain; Risk                 Factors:Hypertension, Diabetes and Dyslipidemia. Covid positive.                 ETOH.  Sonographer:    Roseanna Rainbow RDCS Referring Phys: [id number] [place]  1. Left ventricular ejection fraction, by estimation, is <20%. The left ventricle has severely decreased function. The left ventricle demonstrates global hypokinesis.  2. The left ventricular internal cavity size was severely dilated.  3. Left ventricular diastolic parameters are consistent with Grade II diastolic dysfunction (pseudonormalization).  4. A LV thrombus adherent to the LV apex is visualized and measures 0.63x0.89cm (best seen on clip 52).  5. Right ventricular systolic function is severely reduced. The right ventricular size is mildly-to-moderately enlarged.  6. There is moderately elevated pulmonary artery systolic pressure.  7. Left atrial size was severely dilated.  8. Right atrial size was severely dilated.  9. The mitral valve is grossly normal. Mild to moderate mitral valve regurgitation. 10. Suspect at least mild-to-moderate, eccentric tricuspid valve regurgitation. Likely funcitonal in the setting of annular dilation. 11. The aortic valve is tricuspid. There  is mild calcification of the aortic valve. There is mild thickening of the aortic valve. Aortic valve regurgitation is trivial. Mild aortic valve sclerosis is present, with no evidence of aortic valve stenosis. 12. There is smoke seen in the LV consistent with a low-flow, low--output state Comparison(s): Compared to prior TTE 06/29/19, the RV size has improved with no septal bowing suggestive of improved RV pressures. Otherwise, there is no significant change. FINDINGS  Left Ventricle: A LV thrombus adherent to the LV apex is visualize dand measures 0.63x0.89cm (best seen on clip 52). Left ventricular ejection fraction, by estimation, is <20%. The left ventricle has severely decreased function. The left ventricle demonstrates global hypokinesis. The left ventricular internal cavity size was severely dilated. There is no left ventricular hypertrophy. Left ventricular diastolic parameters are consistent with Grade II diastolic dysfunction (pseudonormalization). Right Ventricle: The right ventricular size is mildly-to-moderately enlarged. No increase in right ventricular wall thickness. Right ventricular systolic function is severely reduced. There is moderately elevated pulmonary artery systolic pressure. The tricuspid regurgitant velocity is 2.76 m/s, and with an assumed right atrial pressure of 15 mmHg, the estimated right ventricular systolic pressure is 07.6 mmHg. Left Atrium: Left atrial size was severely dilated. Right Atrium: Right atrial size was severely dilated. Pericardium: There is no evidence of pericardial effusion. Mitral Valve: The mitral valve is grossly normal. There is mild thickening of the mitral valve leaflet(s). Mild to moderate mitral valve regurgitation. Tricuspid Valve: The tricuspid valve is normal in structure. Tricuspid valve regurgitation suspect moderate, highly eccentric tricuspid regurgitation. Aortic Valve: The aortic valve is tricuspid. There is mild calcification of the aortic  valve. There is mild thickening of the aortic valve. Aortic valve regurgitation is trivial. Mild aortic valve sclerosis is present, with no evidence of aortic valve stenosis. Pulmonic Valve: The pulmonic valve was normal in structure. Pulmonic valve regurgitation is mild. Aorta: The aortic root is normal in size and structure. IAS/Shunts: No atrial level shunt detected by color flow Doppler. Additional Comments: There is a small pleural effusion in the left lateral region.  LEFT VENTRICLE PLAX 2D LVIDd:         6.80 cm  Diastology LVIDs:         6.50 cm      LV e' medial:    3.26 cm/s LV PW:         1.50 cm      LV E/e' medial:  23.4 LV IVS:        1.30 cm      LV e' lateral:   5.87 cm/s LVOT diam:     2.00 cm      LV E/e' lateral: 13.0 LV SV:         44 LV SV Index:   21 LVOT Area:     3.14 cm  LV Volumes (MOD) LV vol d, MOD A2C: 275.0 ml LV vol d, MOD A4C: 287.0 ml LV vol s, MOD A2C: 264.0 ml LV vol s, MOD A4C: 266.0 ml LV SV MOD A2C:     11.0 ml LV SV MOD A4C:     287.0 ml LV SV MOD BP:      13.8 ml RIGHT VENTRICLE            IVC RV S prime:     7.72 cm/s  IVC diam: 2.80 cm LEFT ATRIUM              Index       RIGHT ATRIUM           Index LA diam:        5.00 cm  2.39 cm/m  RA Area:     37.80 cm LA Vol (A2C):   117.0 ml 55.89 ml/m RA Volume:   211.00 ml 100.79 ml/m LA Vol (A4C):   82.1 ml  39.22 ml/m LA Biplane Vol: 100.0 ml 47.77 ml/m  AORTIC VALVE             PULMONIC VALVE LVOT Vmax:   80.20 cm/s  PR End Diast Vel: 2.39 msec LVOT Vmean:  60.300 cm/s LVOT VTI:    0.141 m  AORTA Ao Root diam: 3.80 cm Ao Asc diam:  3.50 cm MITRAL VALVE                 TRICUSPID VALVE MV Area (PHT): 3.31 cm      TR Peak grad:   30.5 mmHg MV Decel Time: 229 msec      TR Vmax:        276.00 cm/s MR Peak grad:    78.5 mmHg MR Mean grad:    51.0 mmHg   SHUNTS MR Vmax:         443.00 cm/s Systemic VTI:  0.14 m MR Vmean:        341.0 cm/s  Systemic Diam: 2.00 cm MR PISA:         1.57 cm MR PISA Eff ROA: 13 mm MR PISA  Radius:  0.50 cm MV E velocity: 76.20 cm/s MV A velocity: 50.90 cm/s MV E/A ratio:  1.50 Gwyndolyn Kaufman MD Electronically signed by Gwyndolyn Kaufman MD Signature Date/Time: 10/12/2020/11:51:10 AM    Final     Procedures Procedures   Medications Ordered in ED Medications  carvedilol (COREG) tablet 3.125 mg (3.125 mg Oral Given 10/13/20 0952)  aspirin EC tablet 81 mg (81 mg Oral Given 10/13/20 0952)  sodium chloride flush (NS) 0.9 % injection 3 mL (3 mLs Intravenous Given 10/13/20 0956)  sodium chloride flush (NS) 0.9 % injection 3 mL (has no administration in time range)  0.9 %  sodium chloride infusion (has no administration in time range)  acetaminophen (TYLENOL) tablet 650 mg (has no administration in time range)  ondansetron (ZOFRAN) injection 4 mg (has no administration in time range)  polyethylene glycol (MIRALAX / GLYCOLAX) packet 17 g (has no administration in time range)  Chlorhexidine Gluconate Cloth 2 % PADS 6 each (6 each Topical Given 10/13/20 0953)  furosemide (LASIX) injection 80 mg (has no administration in time range)  hydrALAZINE (APRESOLINE) tablet 10 mg (10 mg Oral Given 10/13/20 1351)  isosorbide mononitrate (IMDUR) 24 hr tablet 15 mg (15 mg Oral Given 10/13/20 1351)  heparin bolus via infusion 4,000 Units (has no administration in time range)  heparin ADULT infusion 100 units/mL (25000 units/259mL) (has no administration in time range)  furosemide (LASIX) injection 60 mg (60 mg Intravenous Given 10/11/20 1914)  furosemide (LASIX) injection 20 mg (20 mg Intravenous Given 10/11/20 2236)    ED Course  I have reviewed the triage vital signs and the nursing notes.  Pertinent labs & imaging results that were available during my care of the patient were reviewed by me and considered in my medical decision making (see chart for details).    MDM Rules/Calculators/A&P                          80 year old male comes in with chief complaint of shortness of breath.  He also has  increased abdominal distention and guards.  He has history of advanced CHF with EF less than 20% and liver cirrhosis with ascites.  Abdominal exam is overall reassuring.  Low suspicion for SBP, in the setting of no abdominal pain, no infection-like symptoms.  Likely the ascites is secondary to CHF and also low albumin state.  Patient also has cough.  Cough appears to be acute on chronic, likely because of pulmonary edema.  Chest x-ray is significantly concerning for volume overload side.  History is suggestive of likely pulmonary edema.  BNP significantly elevated.  We will give IV Lasix for what appears to be acute on chronic CHF.  Final Clinical Impression(s) / ED Diagnoses Final diagnoses:  Cirrhosis of liver with ascites ([place])  Ascites    Rx / DC Orders ED Discharge Orders    None       Varney Biles, MD 10/13/20 1431

## 2020-10-13 NOTE — Consult Note (Signed)
[facility] Nurse Consult Note: Reason for Consult:Bilateral lower extremity wounds.  Chronic nonhealing.  Goes to [facility] wound care center.  Current treatment is compression and silver hydrofiber.   Wound type:Nonhealing wounds. CHF with volume overload. Edema to lower legs Pressure Injury POA: NA Measurement: 3 areas on right lateral leg:  Each one is 0.3 cm x 0.2 cm x 0.2 cm  2 areas to left lateral and left medial leg 1 cm x 1 cm x 0.2 cm  Wound ASN:KNLZJ red Drainage (amount, consistency, odor) minimal serosanguinous  No odor.  Periwound: intact skin   Dressing procedure/placement/frequency:Cleanse legs with soap and water and pat dry Apply Aquacel Ag ([facility] # [id number]) to open legs.  Wrap legs with kerlix from below toes to below knee.  Secure with self adherent COban.  Change Monday and Thursday.  Bedside RN to perform.  Will not follow at this time.  Please re-consult if needed.  Domenic Moras MSN, RN, FNP-BC CWON Wound, Ostomy, Continence Nurse Pager [phone]

## 2020-10-14 ENCOUNTER — Inpatient Hospital Stay (HOSPITAL_COMMUNITY): Payer: Medicare Other

## 2020-10-14 DIAGNOSIS — I236 Thrombosis of atrium, auricular appendage, and ventricle as current complications following acute myocardial infarction: Secondary | ICD-10-CM

## 2020-10-14 DIAGNOSIS — I5043 Acute on chronic combined systolic (congestive) and diastolic (congestive) heart failure: Secondary | ICD-10-CM | POA: Diagnosis not present

## 2020-10-14 DIAGNOSIS — I5023 Acute on chronic systolic (congestive) heart failure: Secondary | ICD-10-CM | POA: Diagnosis not present

## 2020-10-14 LAB — CBC WITH DIFFERENTIAL/PLATELET
Abs Immature Granulocytes: 0.01 10*3/uL (ref 0.00–0.07)
Basophils Absolute: 0 10*3/uL (ref 0.0–0.1)
Basophils Relative: 1 %
Eosinophils Absolute: 0.1 10*3/uL (ref 0.0–0.5)
Eosinophils Relative: 3 %
HCT: 25.2 % — ABNORMAL LOW (ref 39.0–52.0)
Hemoglobin: 7.7 g/dL — ABNORMAL LOW (ref 13.0–17.0)
Immature Granulocytes: 0 %
Lymphocytes Relative: 14 %
Lymphs Abs: 0.4 10*3/uL — ABNORMAL LOW (ref 0.7–4.0)
MCH: 27.4 pg (ref 26.0–34.0)
MCHC: 30.6 g/dL (ref 30.0–36.0)
MCV: 89.7 fL (ref 80.0–100.0)
Monocytes Absolute: 0.2 10*3/uL (ref 0.1–1.0)
Monocytes Relative: 8 %
Neutro Abs: 2.3 10*3/uL (ref 1.7–7.7)
Neutrophils Relative %: 74 %
Platelets: 242 10*3/uL (ref 150–400)
RBC: 2.81 MIL/uL — ABNORMAL LOW (ref 4.22–5.81)
RDW: 18.5 % — ABNORMAL HIGH (ref 11.5–15.5)
WBC: 3 10*3/uL — ABNORMAL LOW (ref 4.0–10.5)
nRBC: 0 % (ref 0.0–0.2)

## 2020-10-14 LAB — ECHOCARDIOGRAM LIMITED
Calc EF: 17.1 %
Height: 71 in
Single Plane A2C EF: 19.8 %
Single Plane A4C EF: 16.2 %
Weight: 2860.69 oz

## 2020-10-14 LAB — HEPARIN LEVEL (UNFRACTIONATED)
Heparin Unfractionated: 0.11 IU/mL — ABNORMAL LOW (ref 0.30–0.70)
Heparin Unfractionated: 0.52 IU/mL (ref 0.30–0.70)
Heparin Unfractionated: <0.1 IU/mL — ABNORMAL LOW (ref 0.30–0.70)

## 2020-10-14 LAB — BASIC METABOLIC PANEL
Anion gap: 11 (ref 5–15)
BUN: 62 mg/dL — ABNORMAL HIGH (ref 8–23)
CO2: 25 mmol/L (ref 22–32)
Calcium: 8.4 mg/dL — ABNORMAL LOW (ref 8.9–10.3)
Chloride: 105 mmol/L (ref 98–111)
Creatinine, Ser: 2.68 mg/dL — ABNORMAL HIGH (ref 0.61–1.24)
GFR, Estimated: 24 mL/min — ABNORMAL LOW (ref >60)
Glucose, Bld: 88 mg/dL (ref 70–99)
Potassium: 4.1 mmol/L (ref 3.5–5.1)
Sodium: 141 mmol/L (ref 135–145)

## 2020-10-14 MED ORDER — TORSEMIDE 20 MG PO TABS
20.0000 mg | ORAL_TABLET | Freq: Every day | ORAL | Status: DC
  Administered 2020-10-15: 20 mg via ORAL
  Filled 2020-10-14 (×2): qty 1

## 2020-10-14 MED ORDER — HEPARIN BOLUS VIA INFUSION
2000.0000 [IU] | Freq: Once | INTRAVENOUS | Status: AC
  Administered 2020-10-14: 2000 [IU] via INTRAVENOUS
  Filled 2020-10-14: qty 2000

## 2020-10-14 NOTE — Progress Notes (Signed)
Patient's blood pressure 88/48. Will hold hydralazine. No other orders at this time per Dr. Pietro Cassis.

## 2020-10-14 NOTE — Progress Notes (Signed)
Cardiology Progress Note  Patient ID: [name] MRN: [id number] DOB: 15-Dec-1940 Date of Encounter: 10/14/2020  Primary Cardiologist: Candee Furbish, MD  Subjective   Chief Complaint: Feeling better.  HPI: Still short of breath.  Not back to baseline.  9.4 L removed on paracentesis yesterday.  Net -2.9 L per diuresis.  Weights are down from 89 to 81 kg.  Appears to be effectively euvolemic on examination.  ROS:  All other ROS reviewed and negative. Pertinent positives noted in the HPI.     Inpatient Medications  Scheduled Meds: . aspirin EC  81 mg Oral Daily  . carvedilol  3.125 mg Oral BID  . Chlorhexidine Gluconate Cloth  6 each Topical Daily  . hydrALAZINE  10 mg Oral Q8H  . isosorbide mononitrate  15 mg Oral Daily  . sodium chloride flush  3 mL Intravenous Q12H  . torsemide  20 mg Oral Daily   Continuous Infusions: . sodium chloride     PRN Meds: sodium chloride, acetaminophen, ondansetron (ZOFRAN) IV, polyethylene glycol, sodium chloride flush   Vital Signs   Vitals:   10/13/20 1606 10/13/20 2019 10/14/20 0637 10/14/20 1049  BP: 112/74 100/61 108/60 100/66  Pulse:  67 68 71  Resp:  18 18 18   Temp:  97.9 F (36.6 C) (!) 97.5 F (36.4 C) 97.7 F (36.5 C)  TempSrc:  Oral Oral Axillary  SpO2:  100% 100% 100%  Weight:   81.1 kg   Height:        Intake/Output Summary (Last 24 hours) at 10/14/2020 1247 Last data filed at 10/14/2020 8413 Gross per 24 hour  Intake --  Output 1200 ml  Net -1200 ml   Last 3 Weights 10/14/2020 10/13/2020 10/12/2020  Weight (lbs) 178 lb 12.7 oz 198 lb 6.6 oz 196 lb 10.4 oz  Weight (kg) 81.1 kg 90 kg 89.2 kg  Some encounter information is confidential and restricted. Go to Review Flowsheets activity to see all data.      Telemetry  Overnight telemetry shows sinus rhythm with heart rate in the 70s, which I personally reviewed.   ECG  The most recent ECG shows sinus rhythm with old anterior infarct, which I personally reviewed.    Physical Exam   Vitals:   10/13/20 1606 10/13/20 2019 10/14/20 0637 10/14/20 1049  BP: 112/74 100/61 108/60 100/66  Pulse:  67 68 71  Resp:  18 18 18   Temp:  97.9 F (36.6 C) (!) 97.5 F (36.4 C) 97.7 F (36.5 C)  TempSrc:  Oral Oral Axillary  SpO2:  100% 100% 100%  Weight:   81.1 kg   Height:         Intake/Output Summary (Last 24 hours) at 10/14/2020 1247 Last data filed at 10/14/2020 2440 Gross per 24 hour  Intake --  Output 1200 ml  Net -1200 ml    Last 3 Weights 10/14/2020 10/13/2020 10/12/2020  Weight (lbs) 178 lb 12.7 oz 198 lb 6.6 oz 196 lb 10.4 oz  Weight (kg) 81.1 kg 90 kg 89.2 kg  Some encounter information is confidential and restricted. Go to Review Flowsheets activity to see all data.    Body mass index is 24.94 kg/m.   General: Well nourished, well developed, in no acute distress Head: Atraumatic, normal size  Eyes: PEERLA, EOMI  Neck: Supple, JVD 5 to 7 cm of water Endocrine: No thryomegaly Cardiac: Normal S1, S2; RRR; S3 present Lungs: Diminished breath sounds at the lung bases Abd: Soft, nontender, no hepatomegaly  Ext: Trace edema Musculoskeletal: No deformities, BUE and BLE strength normal and equal Skin: Warm and dry, no rashes   Neuro: Alert and oriented to person, place, time, and situation, CNII-XII grossly intact, no focal deficits  Psych: Normal mood and affect   Labs  High Sensitivity Troponin:  No results for input(s): TROPONINIHS in the last 720 hours.   Cardiac EnzymesNo results for input(s): TROPONINI in the last 168 hours. No results for input(s): TROPIPOC in the last 168 hours.  Chemistry Recent Labs  Lab 10/11/20 1940 10/12/20 0421 10/13/20 0221 10/14/20 0412  NA  --  139 137 141  K  --  4.2 4.1 4.1  CL  --  101 99 105  CO2  --  27 26 25   GLUCOSE  --  107* 111* 88  BUN  --  57* 63* 62*  CREATININE  --  2.41* 2.60* 2.68*  CALCIUM  --  8.4* 8.5* 8.4*  PROT 8.7* 7.8  --   --   ALBUMIN 2.9* 2.7*  --   --   AST 16 14*  --    --   ALT 8 7  --   --   ALKPHOS 54 46  --   --   BILITOT 1.4* 1.7*  --   --   GFRNONAA  --  27* 25* 24*  ANIONGAP  --  11 12 11     Hematology Recent Labs  Lab 10/12/20 0421 10/13/20 0221 10/14/20 0412  WBC 4.1 4.0 3.0*  RBC 3.09* 2.98* 2.81*  HGB 8.6* 8.4* 7.7*  HCT 28.2* 27.4* 25.2*  MCV 91.3 91.9 89.7  MCH 27.8 28.2 27.4  MCHC 30.5 30.7 30.6  RDW 18.6* 18.4* 18.5*  PLT 235 254 242   BNP Recent Labs  Lab 10/11/20 1606  BNP 4,154.2*    DDimer No results for input(s): DDIMER in the last 168 hours.   Radiology  US Paracentesis  Result Date: 10/13/2020 INDICATION: Ascites, history of CHF EXAM: ULTRASOUND GUIDED therapeutic PARACENTESIS MEDICATIONS: 10 mL 1% lidocaine COMPLICATIONS: None immediate. PROCEDURE: Informed written consent was obtained from the patient after a discussion of the risks, benefits and alternatives to treatment. A timeout was performed prior to the initiation of the procedure. Initial ultrasound scanning demonstrates a large amount of ascites within the left lower abdominal quadrant. The left lower abdomen was prepped and draped in the usual sterile fashion. 1% lidocaine was used for local anesthesia. Following this, a 19 gauge, 10-cm, Yueh catheter was introduced. An ultrasound image was saved for documentation purposes. The paracentesis was performed. The catheter was removed and a dressing was applied. The patient tolerated the procedure well without immediate post procedural complication. Patient received post-procedure intravenous albumin; see nursing notes for details. FINDINGS: A total of approximately 9.4 L of yellow cloudy fluid was removed. Samples were not sent to the laboratory as requested by the clinical team. IMPRESSION: Successful ultrasound-guided paracentesis yielding 9.4 liters of peritoneal fluid. Read by: Durenda Guthrie, PA-C Electronically Signed   By: Jacqulynn Cadet M.D.   On: 10/13/2020 16:25   ECHOCARDIOGRAM LIMITED  Result Date:  10/14/2020    ECHOCARDIOGRAM LIMITED REPORT   Patient Name:   [name] Date of Exam: 10/14/2020 Medical Rec #:  [id number]        Height:       71.0 in Accession #:    [id number]       Weight:       178.8 lb Date of Birth:  [birth year]  BSA:          2.010 m Patient Age:    80 years         BP:           108/60 mmHg Patient Gender: M                HR:           68 bpm. Exam Location:  Inpatient Procedure: Limited Echo, Color Doppler, Intracardiac Opacification Agent and            Cardiac Doppler Indications:    LV (left ventricular) mural thrombus [[id number]]  History:        Patient has prior history of Echocardiogram examinations, most                 recent 10/12/2020. CHF, PAD; Risk Factors:Hypertension, Diabetes                 and Dyslipidemia. COVID. ETOH. Cardiomyopathy. Chronic kidney                 disease.  Sonographer:    Darlina Sicilian RDCS Referring Phys: [id number] [place]  1. No left ventricular thrombus with Definity contrast. Left ventricular ejection fraction, by estimation, is <20%. The left ventricle has severely decreased function. The left ventricle demonstrates global hypokinesis. The left ventricular internal cavity size was severely dilated.  2. Right ventricular systolic function is moderately reduced. The right ventricular size is severely enlarged. There is moderately elevated pulmonary artery systolic pressure. The estimated right ventricular systolic pressure is 46.9 mmHg.  3. Left atrial size was severely dilated.  4. Right atrial size was severely dilated.  5. The mitral valve is normal in structure. Mild mitral valve regurgitation.  6. The aortic valve is tricuspid. Aortic valve regurgitation is not visualized. Mild aortic valve sclerosis is present, with no evidence of aortic valve stenosis. Comparison(s): Prior images reviewed side by side. No thrombus is seen in the left ventricle with Definity contrast, on the current images. FINDINGS  Left  Ventricle: No left ventricular thrombus with Definity contrast. Left ventricular ejection fraction, by estimation, is <20%. The left ventricle has severely decreased function. The left ventricle demonstrates global hypokinesis. The left ventricular  internal cavity size was severely dilated. There is no left ventricular hypertrophy. Abnormal (paradoxical) septal motion, consistent with left bundle branch block. Right Ventricle: The right ventricular size is severely enlarged. No increase in right ventricular wall thickness. Right ventricular systolic function is moderately reduced. There is moderately elevated pulmonary artery systolic pressure. The tricuspid regurgitant velocity is 3.01 m/s, and with an assumed right atrial pressure of 15 mmHg, the estimated right ventricular systolic pressure is 62.9 mmHg. Left Atrium: Left atrial size was severely dilated. Right Atrium: Right atrial size was severely dilated. Pericardium: There is no evidence of pericardial effusion. Mitral Valve: The mitral valve is normal in structure. Mild mitral valve regurgitation. Tricuspid Valve: The tricuspid valve is normal in structure. Tricuspid valve regurgitation is mild. Aortic Valve: The aortic valve is tricuspid. Aortic valve regurgitation is not visualized. Mild aortic valve sclerosis is present, with no evidence of aortic valve stenosis. Pulmonic Valve: The pulmonic valve was normal in structure. Pulmonic valve regurgitation is mild. Aorta: The aortic root and ascending aorta are structurally normal, with no evidence of dilitation. IAS/Shunts: The interatrial septum was not assessed.  LV Volumes (MOD) LV vol d, MOD A2C: 333.0 ml LV vol d, MOD A4C: 351.0 ml LV vol  s, MOD A2C: 267.0 ml LV vol s, MOD A4C: 294.0 ml LV SV MOD A2C:     66.0 ml LV SV MOD A4C:     351.0 ml LV SV MOD BP:      59.6 ml TRICUSPID VALVE TR Peak grad:   36.2 mmHg TR Vmax:        301.00 cm/s Sanda Klein MD Electronically signed by Sanda Klein MD  Signature Date/Time: 10/14/2020/9:58:28 AM    Final     Cardiac Studies  TTE 10/12/2020 1. Left ventricular ejection fraction, by estimation, is <20%. The left  ventricle has severely decreased function. The left ventricle demonstrates  global hypokinesis.  2. The left ventricular internal cavity size was severely dilated.  3. Left ventricular diastolic parameters are consistent with Grade II  diastolic dysfunction (pseudonormalization).  4. A LV thrombus adherent to the LV apex is visualized and measures  0.63x0.89cm (best seen on clip 52).  5. Right ventricular systolic function is severely reduced. The right  ventricular size is mildly-to-moderately enlarged.  6. There is moderately elevated pulmonary artery systolic pressure.  7. Left atrial size was severely dilated.  8. Right atrial size was severely dilated.  9. The mitral valve is grossly normal. Mild to moderate mitral valve  regurgitation.  10. Suspect at least mild-to-moderate, eccentric tricuspid valve  regurgitation. Likely funcitonal in the setting of annular dilation.  11. The aortic valve is tricuspid. There is mild calcification of the  aortic valve. There is mild thickening of the aortic valve. Aortic valve  regurgitation is trivial. Mild aortic valve sclerosis is present, with no  evidence of aortic valve stenosis.  12. There is smoke seen in the LV consistent with a low-flow, low--output  state   TTE 10/14/2020 1. No left ventricular thrombus with Definity contrast. Left ventricular  ejection fraction, by estimation, is <20%. The left ventricle has severely  decreased function. The left ventricle demonstrates global hypokinesis.  The left ventricular internal  cavity size was severely dilated.  2. Right ventricular systolic function is moderately reduced. The right  ventricular size is severely enlarged. There is moderately elevated  pulmonary artery systolic pressure. The estimated right ventricular   systolic pressure is 77.8 mmHg.  3. Left atrial size was severely dilated.  4. Right atrial size was severely dilated.  5. The mitral valve is normal in structure. Mild mitral valve  regurgitation.  6. The aortic valve is tricuspid. Aortic valve regurgitation is not  visualized. Mild aortic valve sclerosis is present, with no evidence of  aortic valve stenosis.    Patient Profile  [name] is a 80 y.o. 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  Also found to have COVID-19 pneumonia.  Assessment & Plan   1.  Acute on chronic systolic heart failure, EF 10 to 15%/end-stage cardiomyopathy -Severely reduced EF.  He is not a candidate for advanced therapies given his kidney disease, liver disease and medical noncompliance.  All we have to offer his medical therapy. -He also had volume overload with significant ascites related to cirrhosis.  He has had 9 L removed from his abdomen and nearly -3 L systemically.  Weights are down to 89 to 81 kg. -He is tolerating Coreg 3.125 mg twice daily. -No ACE/ARB/Arni/MRA given significant  CKD. -Continue hydralazine 10 mg 3 times daily and Imdur 50 mg daily. -He appears euvolemic to me.  We will transition to torsemide 20 mg daily. -Not a candidate for digoxin given significant CKD. -I did discuss with him that he has no advanced options.  Would recommend to continue medical therapy as he has been noncompliant in the past.  2.  LV thrombus? -History of LV thrombus and was on Coumadin at one point.  Due to GI bleed and medical noncompliance he is stopped this.  We repeated his echocardiogram with contrast which demonstrate there is no LV thrombus. -I would recommend to hold anticoagulation  going forward.  He is not a candidate for long-term anticoagulation anyway.  For questions or updates, please contact [place] Please consult [email or web address] for contact info under   Time Spent with Patient: I have spent a total of 25 minutes with patient reviewing hospital notes, telemetry, EKGs, labs and examining the patient as well as establishing an assessment and plan that was discussed with the patient.  > 50% of time was spent in direct patient care.    Signed, Addison Naegeli. Audie Box, [place]  10/14/2020 12:47 PM

## 2020-10-14 NOTE — Progress Notes (Signed)
ANTICOAGULATION CONSULT NOTE  Pharmacy Consult for heparin Indication: LV thrombus  No Known Allergies  Patient Measurements: Height: 5\' 11"  (180.3 cm) Weight: 90 kg (198 lb 6.6 oz) IBW/kg (Calculated) : 75.3 Heparin Dosing Weight: 90 kg  Vital Signs: Temp: 97.9 F (36.6 C) (02/14 2019) Temp Source: Oral (02/14 2019) BP: 100/61 (02/14 2019) Pulse Rate: 67 (02/14 2019)  Labs: Recent Labs    10/11/20 1606 10/12/20 0421 10/13/20 0221 10/13/20 2302  HGB 9.1* 8.6* 8.4*  --   HCT 30.0* 28.2* 27.4*  --   PLT 256 235 254  --   APTT  --  36  --   --   LABPROT 16.0*  --   --   --   INR 1.3*  --   --   --   HEPARINUNFRC  --   --   --  0.11*  CREATININE 2.34* 2.41* 2.60*  --     Estimated Creatinine Clearance: 25.3 mL/min (A) (by C-G formula based on SCr of 2.6 mg/dL (H)).  Assessment: Patient is a 80 y.o M with hx CKD, PVD, liver cirrhosis  and HF presented to the ED on 2/12 with c/o SOB. Echo on 2/13 showed a reduced EF and LV thrombus. Pharmacy has been consulted to dose heparin drip for LV thrombus.  Today, 10/14/2020: - Initial heparin level 0.11; subtherapeutic on IV heparin @ 1200 units/hr - No bleeding or infusion related concerns per RN  Goal of Therapy:  Heparin level 0.3-0.7 units/ml Monitor platelets by anticoagulation protocol: Yes   Plan:  -Re-bolus heparin 2000 units IV x1, then increase infusion to 1500 units/hr - Recheck 8 hr heparin level  - monitor for s/sx bleeding  Netta Cedars PharmD 10/14/2020,12:49 AM

## 2020-10-14 NOTE — Progress Notes (Signed)
PROGRESS NOTE  [name]  DOB: 02-03-1941  PCP: [name], MD KWI:[id number]  DOA: 10/11/2020  LOS: 2 days   Chief Complaint  Patient presents with  . Edema   Brief narrative: [name] is a 80 y.o. 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 Patient presented to the ED on 2/12 with complaint of worsening shortness of breath, bilateral pedal edema and abdominal girth since her last 1 month. Patient states he quit alcohol several months ago and quit smoking several years ago.  He has chronic bilateral lower extremity edema and follows up at wound care center.  He recently learned that he has liver cirrhosis.  Never required paracentesis. Currently lives at home with his wife..  Was able to ambulate with a walker until few weeks ago Reports compliance to his usual medications including Demadex.  In the ED, patient was afebrile, heart rate 103, blood pressure in 100s, breathing comfortably in room air. Labs with creatinine elevated 2.34, BNP elevated to more than 4000, hemoglobin low at 9.1, liver enzymes normal, acute hepatitis panel negative Covid PCR positive Chest x-ray with ill-defined perihilar opacity on the right, suspicious for focal pneumonia in this area.  In the ED, patient was given 80 mg of IV Lasix. Admitted to hospitalist service for further evaluation management.    Subjective: Patient was seen and examined this morning.  Pleasant elderly African-American male.  Lying on bed.  Not in distress.  Not on supplemental oxygen. Feels better after he had 9.4 L of ascites fluid tapped yesterday. Remains on heparin drip.  Assessment/Plan: Progressive volume overload -Presented with worsening  shortness of breath, bilateral pedal edema, abdominal girth -Multifactorial: Liver cirrhosis, severe systolic CHF, dietary indiscretion, suspect noncompliance to diuresis. -See details below  Acute exacerbation of chronic CHF Essential hypertension -Presented with shortness of breath, improving with diuresis, currently on Lasix 40 mg IV daily.   -Echocardiogram 2/13 with EF less than 20%, severe right ventricular failure and grade 2 diastolic dysfunction along with an adherent LV thrombus. -Also continue Coreg 3.125 mg twice daily. -Cardiology service consult appreciated.  Increased Lasix to 80 mg IV twice daily.  Also added low-dose Imdur and hydralazine. -Currently on a trial of heparin drip. -Net IO Since Admission: -2,990 mL [10/14/20 1231] -Continue to monitor for daily intake output, weight, blood pressure, BNP, renal function and electrolytes. Recent Labs  Lab 10/11/20 1606 10/12/20 0421 10/13/20 0221 10/14/20 0412  BNP 4,154.2*  --   --   --   BUN 57* 57* 63* 62*  CREATININE 2.34* 2.41* 2.60* 2.68*  K 4.0 4.2 4.1 4.1  MG 2.3 2.1 2.1  --    Liver cirrhosis -Unclear etiology.  History of chronic alcoholism and also has severe right ventricular failure.  Acute hepatitis panel nonreactive. -Liver ultrasound from 09/29/2020 confirmed liver cirrhosis, also showed ascites. -Currently on beta-blocker and IV Lasix.  Will consider Aldactone as tolerated by blood pressure prior to discharge. -2/14, underwent 9.4 L of acetic fluid tapped.  Chronic alcoholism -Reportedly quit several months ago.  Not in withdrawal symptoms at this time  Diabetes mellitus -Seems controlled.  A1c 4.7 on 2/13. -Not on diabetes meds at home.  Watch out for hypoglycemia  CKD stage IV -Baseline creatinine less than  2.5 -Currently at baseline.  At risk of worsening because he needs aggressive diuresis.  Gradually worsening kidney function.  We will keep an eye.  I would avoid giving him fluid at this time.   Diuresis per cardiology. Recent Labs    05/17/20 1115 05/19/20 0408 05/20/20 0453 05/21/20 0616 05/22/20 0832 09/26/20 1035 10/11/20 1606 10/12/20 0421 10/13/20 0221 10/14/20 0412  BUN 71* 67* 63* 57* 55* 67* 57* 57* 63* 62*  CREATININE 2.09* 2.60* 2.54* 2.42* 2.07* 2.49* 2.34* 2.41* 2.60* 2.68*   Peripheral artery disease/hyperlipidemia -Not on statin.  Presumably due to liver cirrhosis.  H/o LV thrombus -not on anticoagulation because of multiple episodes of GI bleeding.  Chronic anemia  H/O GI bleeding in the past GERD -Continue PPI -Currently on a trial of heparin drip.  I noticed that his hemoglobin dropped from 8.4 yesterday to 7.7 today.  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  Impaired mobility -PT eval obtained.  Home health PT recommended  Mobility: Encourage ambulation.  PT eval obtained Code Status:   Code Status: Full Code  Nutritional status: Body mass index is 24.94 kg/m.     Diet Order            Diet Heart Room service appropriate? No; Fluid consistency: Thin  Diet effective now                DVT prophylaxis:    Antimicrobials:  None Fluid: None Consultants: None Family Communication:  None at bedside  Status is: Observation  The patient will require care spanning > 2 midnights and should be moved to inpatient because: Ongoing IV diuresis, heparin drip  Dispo: The patient is from: Home              Anticipated d/c is to: Home with home health PT              Anticipated d/c date is: 3 days              Patient currently is not medically stable to d/c.   Difficult to place patient No   Infusions:  . sodium chloride    . heparin 1,500  Units/hr (10/14/20 0836)    Scheduled Meds: . aspirin EC  81 mg Oral Daily  . carvedilol  3.125 mg Oral BID  . Chlorhexidine Gluconate Cloth  6 each Topical Daily  . furosemide  80 mg Intravenous BID  . hydrALAZINE  10 mg Oral Q8H  . isosorbide mononitrate  15 mg Oral Daily  . sodium chloride flush  3 mL Intravenous Q12H    Antimicrobials: Anti-infectives (From admission, onward)   None      PRN meds: sodium chloride, acetaminophen, ondansetron (ZOFRAN) IV, polyethylene glycol, sodium chloride flush   Objective: Vitals:   10/14/20 0637 10/14/20 1049  BP: 108/60 100/66  Pulse: 68 71  Resp: 18 18  Temp: (!) 97.5 F (36.4 C) 97.7 F (36.5 C)  SpO2: 100% 100%    Intake/Output Summary (Last 24 hours) at 10/14/2020 1231 Last data filed at 10/14/2020 5852 Gross per 24 hour  Intake -  Output 1200 ml  Net -1200 ml   Filed Weights   10/12/20 0433 10/13/20 0519  10/14/20 0637  Weight: 89.2 kg 90 kg 81.1 kg   Weight change: -8.9 kg Body mass index is 24.94 kg/m.   Physical Exam: General exam: Pleasant, elderly African-American male.  Not in distress at this time.  Not on supplemental oxygen Skin: No rashes, lesions or ulcers. HEENT: Atraumatic, normocephalic, no obvious bleeding Lungs: Clear to auscultation bilaterally CVS: Regular rate and rhythm, no murmur GI/Abd soft, distention improved after paracentesis, nontender, bowel sound present CNS: Alert, awake, oriented x3 Psychiatry: Mood appropriate Extremities: Chronic bilateral lower extremity wounds, compression bandages on.  Wrinkles seen in the thighs  Data Review: I have personally reviewed the laboratory data and studies available.  Recent Labs  Lab 10/11/20 1606 10/12/20 0421 10/13/20 0221 10/14/20 0412  WBC 4.0 4.1 4.0 3.0*  NEUTROABS  --  3.6 3.3 2.3  HGB 9.1* 8.6* 8.4* 7.7*  HCT 30.0* 28.2* 27.4* 25.2*  MCV 90.4 91.3 91.9 89.7  PLT 256 235 254 242   Recent Labs  Lab 10/11/20 1606 10/12/20 0421  10/13/20 0221 10/14/20 0412  NA 137 139 137 141  K 4.0 4.2 4.1 4.1  CL 100 101 99 105  CO2 26 27 26 25   GLUCOSE 114* 107* 111* 88  BUN 57* 57* 63* 62*  CREATININE 2.34* 2.41* 2.60* 2.68*  CALCIUM 8.5* 8.4* 8.5* 8.4*  MG 2.3 2.1 2.1  --    F/u labs ordered.  Unresulted Labs (From admission, onward)          Start     Ordered   10/14/20 1800  Heparin level (unfractionated)  Once-Timed,   TIMED        10/14/20 1023   10/14/20 0500  CBC with Differential/Platelet  Daily,   R      10/13/20 1420   10/13/20 0940  Basic metabolic panel  Daily,   R      10/12/20 1115         Signed, Terrilee Croak, MD [facility] 10/14/2020

## 2020-10-14 NOTE — Progress Notes (Signed)
  Echocardiogram 2D Echocardiogram limited with definity has been performed.  Darlina Sicilian M 10/14/2020, 7:49 AM

## 2020-10-14 NOTE — Progress Notes (Signed)
[place] for heparin Indication: LV thrombus  No Known Allergies  Patient Measurements: Height: 5\' 11"  (180.3 cm) Weight: 81.1 kg (178 lb 12.7 oz) IBW/kg (Calculated) : 75.3 Heparin Dosing Weight: 90 kg  Vital Signs: Temp: 97.5 F (36.4 C) (02/15 0637) Temp Source: Oral (02/15 0637) BP: 108/60 (02/15 0637) Pulse Rate: 68 (02/15 0637)  Labs: Recent Labs    10/11/20 1606 10/12/20 0421 10/13/20 0221 10/13/20 2302 10/14/20 0412  HGB 9.1* 8.6* 8.4*  --  7.7*  HCT 30.0* 28.2* 27.4*  --  25.2*  PLT 256 235 254  --  242  APTT  --  36  --   --   --   LABPROT 16.0*  --   --   --   --   INR 1.3*  --   --   --   --   HEPARINUNFRC  --   --   --  0.11*  --   CREATININE 2.34* 2.41* 2.60*  --  2.68*    Estimated Creatinine Clearance: 24.6 mL/min (A) (by C-G formula based on SCr of 2.68 mg/dL (H)).  Assessment: Patient is a 80 y.o M with hx CKD, PVD, liver cirrhosis  and HF presented to the ED on 2/12 with c/o SOB. Echo on 2/13 showed a reduced EF and LV thrombus. Pharmacy has been consulted to dose heparin drip for LV thrombus.  Today, 10/14/2020: - heparin level is therapeutic at 0.52 with drip infusing at 1500 units/hr - hgb down 7.7, plts 242K - scr trending up with  2.68 today (crcl~25), on lasix 80mg  IV q12h - s/p paracentesis on 2/14  Goal of Therapy:  Heparin level 0.3-0.7 units/ml Monitor platelets by anticoagulation protocol: Yes   Plan:  -continue heparin drip at 1500 units/hr - recheck heparin level at 6pm to ensure level is still therapeutic before changing to daily monitoring - monitor for s/sx bleeding  Dia Sitter P 10/14/2020,7:29 AM

## 2020-10-15 DIAGNOSIS — Z515 Encounter for palliative care: Secondary | ICD-10-CM

## 2020-10-15 DIAGNOSIS — K7460 Unspecified cirrhosis of liver: Secondary | ICD-10-CM | POA: Diagnosis not present

## 2020-10-15 DIAGNOSIS — I5043 Acute on chronic combined systolic (congestive) and diastolic (congestive) heart failure: Secondary | ICD-10-CM | POA: Diagnosis not present

## 2020-10-15 DIAGNOSIS — N401 Enlarged prostate with lower urinary tract symptoms: Secondary | ICD-10-CM | POA: Diagnosis not present

## 2020-10-15 DIAGNOSIS — Z7189 Other specified counseling: Secondary | ICD-10-CM

## 2020-10-15 DIAGNOSIS — I5023 Acute on chronic systolic (congestive) heart failure: Secondary | ICD-10-CM | POA: Diagnosis not present

## 2020-10-15 DIAGNOSIS — I5022 Chronic systolic (congestive) heart failure: Secondary | ICD-10-CM

## 2020-10-15 LAB — CBC WITH DIFFERENTIAL/PLATELET
Abs Immature Granulocytes: 0.01 10*3/uL (ref 0.00–0.07)
Basophils Absolute: 0 10*3/uL (ref 0.0–0.1)
Basophils Relative: 1 %
Eosinophils Absolute: 0.1 10*3/uL (ref 0.0–0.5)
Eosinophils Relative: 3 %
HCT: 24.9 % — ABNORMAL LOW (ref 39.0–52.0)
Hemoglobin: 7.7 g/dL — ABNORMAL LOW (ref 13.0–17.0)
Immature Granulocytes: 0 %
Lymphocytes Relative: 8 %
Lymphs Abs: 0.4 10*3/uL — ABNORMAL LOW (ref 0.7–4.0)
MCH: 27.8 pg (ref 26.0–34.0)
MCHC: 30.9 g/dL (ref 30.0–36.0)
MCV: 89.9 fL (ref 80.0–100.0)
Monocytes Absolute: 0.3 10*3/uL (ref 0.1–1.0)
Monocytes Relative: 8 %
Neutro Abs: 3.6 10*3/uL (ref 1.7–7.7)
Neutrophils Relative %: 80 %
Platelets: 261 10*3/uL (ref 150–400)
RBC: 2.77 MIL/uL — ABNORMAL LOW (ref 4.22–5.81)
RDW: 18.4 % — ABNORMAL HIGH (ref 11.5–15.5)
WBC: 4.4 10*3/uL (ref 4.0–10.5)
nRBC: 0 % (ref 0.0–0.2)

## 2020-10-15 LAB — BASIC METABOLIC PANEL
Anion gap: 10 (ref 5–15)
BUN: 62 mg/dL — ABNORMAL HIGH (ref 8–23)
CO2: 26 mmol/L (ref 22–32)
Calcium: 8.2 mg/dL — ABNORMAL LOW (ref 8.9–10.3)
Chloride: 103 mmol/L (ref 98–111)
Creatinine, Ser: 2.71 mg/dL — ABNORMAL HIGH (ref 0.61–1.24)
GFR, Estimated: 23 mL/min — ABNORMAL LOW (ref >60)
Glucose, Bld: 95 mg/dL (ref 70–99)
Potassium: 4.4 mmol/L (ref 3.5–5.1)
Sodium: 139 mmol/L (ref 135–145)

## 2020-10-15 MED ORDER — CARVEDILOL 3.125 MG PO TABS: 3.1250 mg | ORAL_TABLET | Freq: Two times a day (BID) | ORAL | 2 refills | Status: DC

## 2020-10-15 MED ORDER — PANTOPRAZOLE SODIUM 40 MG PO TBEC: 40.0000 mg | DELAYED_RELEASE_TABLET | Freq: Every day | ORAL | 2 refills | Status: AC

## 2020-10-15 NOTE — Consult Note (Signed)
Consultation Note Date: 10/15/2020   [name] Name: [name]  DOB: 06-12-1941  MRN: [id number]  Age / Sex: 80 y.o., male   PCP: [name], MD Referring Physician: Terrilee Croak, MD   REASON FOR CONSULTATION:Establishing goals of care  Palliative Care consult requested for goals of care discussion in this 80 y.o. male with multiple medical problems including chronic kidney disease stage IV, peripheral vascular disease, diastolic and systolic congestive heart failure (Echo 05/2019 EF < 71% with diastolic dysfunction and RV failure), GERD, hyperlipidemia, BPH with chronic outlet obstruction- indwelling Foley catheter, history of LV thrombus (on coumadin from 05/2019 to 04/2020), and multiple episodes of GI bleeding in the past (07/2019 and 04/2020). He presented to the ED from home with complaints of shortness of breath and increased abdominal girth.  During work-up ascites noted, BNP 4152, and COVID-19 positive. S/P Paracentesis yielding 9.4L   Clinical Assessment and Goals of Care: I have reviewed medical records including lab results, imaging, Epic notes, and MAR.I spoke with [name] to discuss diagnosis prognosis, [name], EOL wishes, disposition and options.  I introduced Palliative Medicine as specialized medical care for people living with serious illness. It focuses on providing relief from the symptoms and stress of a serious illness. The goal is to improve quality of life for both the [name] and the family. He verbalized understanding.   We discussed a brief life review of the [name], along with his functional and nutritional status. [name] reports he is single. He has 2 sons (1 who lives in the home). 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   Prior to admission [name] reports being able to perform most ADLs independently. Appetite was good. He uses [employer] bus  for transportation or his son will assist in transportation to appointments. He does endorse using a walker to assist with ambulation and stability.   We discussed His current illness and what it means in the larger context of His on-going co-morbidities. With specific discussions regarding ascites, heart conditions, kidney functions, and overall functional state. Natural disease trajectory and expectations at EOL were discussed.  Mr. chais Patton understanding of his current illness and co-morbidities. He shares he is getting older and knows his health should be the center of his focus and of importance. He shares this is why he stopped drinking alcohol.   I attempted to elicit values and goals of care important to the [name].    [name] is clear in expressed goals for continued treatment and hopes of continued improvement. He reports he feels much better since the paracentesis physically and from a breathing standpoint. He is hopeful to return home with continued home health support.   Advanced directives, concepts specific to code status, artifical feeding and hydration, and rehospitalization were considered and discussed. [name] does have a documented advanced directive which names his sister [name] as his HCPOA. Document reviewed.   We discussed at length [name]'s full code status with consideration to his current illness and co-morbidities. [name] request to remain a full code with full scope care, sharing that his sister would make the best decisions depending on the situation. He states he would not want his life to be prolonged on machines, would not want any artificial feeding tubes, and if life support needed only for a short time period. He does expressed his awareness of his  renal concerns and would be open to dialysis if required in hopes of some improvement and life extension however if he was not able to tolerate or his quality of life significantly decreased he  would then wish to discontinue treatments.   Palliative Care services outpatient were explained and offered. [name] verbalized understanding and awareness of  palliative's goals and philosophy of care. He is open to outpatient palliative support at discharge.   Questions and concerns were addressed.  The family was encouraged to call with questions or concerns.  PMT will continue to support holistically.   SOCIAL HISTORY:     reports that he quit smoking about 12 years ago. His smoking use included cigarettes. He has never used smokeless tobacco. He reports current alcohol use. He reports that he does not use drugs.  CODE STATUS: Full code  ADVANCE DIRECTIVES: Primary Decision Maker: [name]    SYMPTOM MANAGEMENT: attending   Palliative Prophylaxis:   Frequent Pain Assessment  PSYCHO-SOCIAL/SPIRITUAL:  Support System: Family  Desire for further Chaplaincy support:not addressed  Additional Recommendations (Limitations, Scope, Preferences):  Full Scope Treatment and No Artificial Feeding  Education on hospice/palliative    PAST MEDICAL HISTORY: Past Medical History:  Diagnosis Date  . Acute cholecystitis 11/23/2017  . Acute GI bleeding 06/30/2019  . Acute respiratory failure with hypoxia ([place])   . Acute upper back pain   . AKI (acute kidney injury) ([employer])   . Alcohol abuse 02/26/2015  . Anemia, deficiency   . Back pain    LUMBOSACRAL  . Bronchitis, complicated   . Cardiogenic shock ([place])   . Cardiomegaly   . Cholangitis due to bile duct calculus with obstruction   . Chronic combined systolic and diastolic CHF (congestive heart failure) ([place])   . Chronic pain    HIGH RISK MED USE COMPREHENSIVE HIGH RISK  . CKD (chronic kidney disease), stage III ([place])   . Demand ischemia ([place])    a. minimally elevated troponin peak 0.11 in 2016, felt demand ischemia. Cath offered but pt wished to have done back in [place].  . Diabetes mellitus without complication ([place])   . Edema, leg    . Elevated troponin 02/26/2015  . Essential hypertension   . Fatigue   . Heart abnormality   . Hyperlipidemia   . Hypertension   . Neck pain, acute   . PVD (peripheral vascular disease) ([place])   . Renal lesion 11/23/2017  . SOB (shortness of breath) on exertion   . Solitary kidney   . Vitamin D deficiency     ALLERGIES:  has No Known Allergies.   MEDICATIONS:  Current Facility-Administered Medications  Medication Dose Route Frequency Provider Last Rate Last Admin  . 0.9 %  sodium chloride infusion  250 mL Intravenous PRN Shalhoub, Sherryll Burger, MD      . acetaminophen (TYLENOL) tablet 650 mg  650 mg Oral Q4H PRN Shalhoub, Sherryll Burger, MD      . aspirin EC tablet 81 mg  81 mg Oral Daily Shalhoub, Sherryll Burger, MD   81 mg at 10/15/20 1042  . carvedilol (COREG) tablet 3.125 mg  3.125 mg Oral BID Vernelle Emerald, MD   3.125 mg at 10/15/20 1043  . Chlorhexidine Gluconate Cloth 2 % PADS 6 each  6 each Topical Daily Shalhoub, Sherryll Burger, MD   6 each at 10/14/20 [phone]  . hydrALAZINE (APRESOLINE) tablet 10 mg  10 mg Oral Q8H Goodrich, Callie E, PA-C   10 mg at 10/14/20 2115  .  isosorbide mononitrate (IMDUR) 24 hr tablet 15 mg  15 mg Oral Daily Sande Rives E, PA-C   15 mg at 10/15/20 1042  . ondansetron (ZOFRAN) injection 4 mg  4 mg Intravenous Q6H PRN Shalhoub, Sherryll Burger, MD      . polyethylene glycol (MIRALAX / GLYCOLAX) packet 17 g  17 g Oral Daily PRN Shalhoub, Sherryll Burger, MD      . sodium chloride flush (NS) 0.9 % injection 3 mL  3 mL Intravenous Q12H Shalhoub, Sherryll Burger, MD   3 mL at 10/14/20 2221  . sodium chloride flush (NS) 0.9 % injection 3 mL  3 mL Intravenous PRN Shalhoub, Sherryll Burger, MD      . torsemide [facility]) tablet 20 mg  20 mg Oral Daily Geralynn Rile, MD   20 mg at 10/15/20 1043    VITAL SIGNS: BP 102/70 (BP Location: Left Arm)   Pulse 79   Temp 97.7 F (36.5 C) (Oral)   Resp 20   Ht 5\' 11"  (1.803 m)   Wt 81.1 kg   SpO2 100%   BMI 24.94 kg/m  Filed Weights    10/12/20 0433 10/13/20 0519 10/14/20 0637  Weight: 89.2 kg 90 kg 81.1 kg    Estimated body mass index is 24.94 kg/m as calculated from the following:   Height as of this encounter: 5\' 11"  (1.803 m).   Weight as of this encounter: 81.1 kg.  LABS: CBC:    Component Value Date/Time   WBC 4.4 10/15/2020 0408   HGB 7.7 (L) 10/15/2020 0408   HCT 24.9 (L) 10/15/2020 0408   PLT 261 10/15/2020 0408   Comprehensive Metabolic Panel:    Component Value Date/Time   NA 139 10/15/2020 0408   K 4.4 10/15/2020 0408   BUN 62 (H) 10/15/2020 0408   CREATININE 2.71 (H) 10/15/2020 0408   ALBUMIN 2.7 (L) 10/12/2020 0421     Review of Systems  Neurological: Positive for weakness.   Unless otherwise noted, a complete review of systems is negative.  The above conversation was completed via telephone due to the visitor restrictions during the COVID-19 pandemic. Thorough chart review and discussion with necessary members of the care team was completed as part of assessment. All issues were discussed and addressed but no physical exam was performed.   Prognosis: Guarded  Discharge Planning:  Home with Home Health and Palliative support   Recommendations: . Full Code-as confirmed by [name] . Continue with current plan of care/Full Scope . Open to HD if ever required. No artificial feeding tubes . [name] remains hopeful for continued improvement. Reports he is feeling much better. Expressed goal is to return home with family support and home health.  . Advanced Directives reviewed naming his sister [name] as his HCPOA.  . [name] open to outpatient Palliative support at discharge [facility] referral placed)  . PMT will continue to support and follow as needed. Please call team line with urgent needs.   Palliative Performance Scale: PPS 30-40%              [name] expressed understanding and was in agreement with this plan.   Thank you for allowing the Palliative Medicine Team to assist in  the care of this [name].  Time In: 1100 Time Out: 1145 Time Total: 45 min.   Visit consisted of counseling and education dealing with the complex and emotionally intense issues of symptom management and palliative care in the setting of serious and potentially life-threatening illness.Greater than  50%  of this time was spent counseling and coordinating care related to the above assessment and plan.  Signed by:  Alda Lea, AGPCNP-BC Palliative Medicine Team  Phone: [phone] Pager: [phone] Amion: Bjorn Pippin

## 2020-10-15 NOTE — Care Management Important Message (Signed)
Important Message  Patient Details IM Letter placed in Patient's door [place]. Name: [name] MRN: [id number] Date of Birth: 02/28/1941   Medicare Important Message Given:  Yes     [name] 10/15/2020, 10:07 AM

## 2020-10-15 NOTE — Progress Notes (Signed)
Discharge instructions reviewed with patient utilizing teach back method. No questions at this time patient discharged to home.

## 2020-10-15 NOTE — Discharge Summary (Signed)
Physician Discharge Summary  [name] IRC:[id number] DOB: [birth year] DOA: 10/11/2020  PCP: Troy Hess., MD  Admit date: 10/11/2020 Discharge date: 10/15/2020  Admitted From: Home Discharge disposition: Home with home health PT, RN   Code Status: Full Code  Diet Recommendation: Cardiac diet  Discharge Diagnosis:   Principal Problem:   Acute on chronic combined systolic and diastolic CHF (congestive heart failure) ([place]) Active Problems:   Mixed hyperlipidemia   Essential hypertension   CKD (chronic kidney disease), stage IV ([place])   GERD without esophagitis   BPH with obstruction/lower urinary tract symptoms   Cirrhosis of liver with ascites ([place])   Volume overload   Chief Complaint  Patient presents with  . Edema   Brief narrative: [name] is a 80 y.o. 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 Patient presented to the ED on 2/12 with complaint of worsening shortness of breath, bilateral pedal edema and abdominal girth since her last 1 month. Patient states he quit alcohol several months ago and quit smoking several years ago.  He has chronic bilateral lower extremity edema and follows up at wound care center.  He recently learned that he has liver cirrhosis.  Never required paracentesis. Currently lives at home with his wife..  Was able to ambulate with a walker until few weeks ago Reports compliance to his usual medications including Demadex.  In the ED, patient was afebrile, heart rate 103, blood pressure in 100s, breathing comfortably in room air. Labs with creatinine elevated 2.34, BNP elevated to more than 4000, hemoglobin low at 9.1, liver enzymes normal, acute hepatitis panel negative Covid  PCR positive Chest x-ray with ill-defined perihilar opacity on the right, suspicious for focal pneumonia in this area.  In the ED, patient was given 80 mg of IV Lasix. Admitted to hospitalist service for further evaluation management.   He was diuresed with IV Lasix.  Seen by cardiology.  Maximal medical optimization done.  Patient underwent paracentesis with removal of 9.4 L of fluid. Seen by physical therapy.  He stable to discharge to home today.  Subjective: Patient was seen and examined this morning.   Lying down in bed.  On 2 L oxygen by nasal cannula.  Not in distress.   Abdomen gradually distending again.  Assessment/Plan: Progressive volume overload -Presented with worsening shortness of breath, bilateral pedal edema, abdominal girth -Multifactorial: Liver cirrhosis, severe systolic CHF, dietary indiscretion, suspect noncompliance to diuresis. -See details below -Volume overload has improved with paracentesis and IV diuresis.  Acute exacerbation of chronic CHF Essential hypertension -Presented with shortness of breath, improving with diuresis, currently on Lasix 40 mg IV daily.   -Echocardiogram 2/13 with EF less than 20%, severe right ventricular failure and grade 2 diastolic dysfunction. -Diuresed with IV Lasix.  Continue Coreg 3.125 mg twice daily. -Cardiology consult was obtained.  Patient was started on hydralazine and Imdur as well.  However because of significant blood pressure drop, they were stopped at discharge. -Continue Coreg 3.125 mg twice daily and torsemide 40 mg daily at home  Acute respiratory failure with hypoxia -On low-flow oxygen currently.  Oxygen saturation intact in ambulation but significantly weak to ambulate.   Liver cirrhosis -Unclear etiology.  History of chronic alcoholism and also has severe right ventricular  failure.  Acute hepatitis panel nonreactive. -Liver ultrasound from 09/29/2020 confirmed liver cirrhosis, also showed ascites. -2/14,  patient underwent 9.4 L of ascites fluid tapped. -Continue Coreg 3.125 mg twice daily and torsemide 40 mg daily at home.  Chronic alcoholism -Reportedly quit several months ago.  Not in withdrawal symptoms at this time  Diabetes mellitus -Seems controlled.  A1c 4.7 on 2/13. -Not on diabetes meds at home.    AKI on CKD stage IV -Baseline creatinine less than 2.5 -With IV diuresis, creatinine slightly worsened, at 2.71 today.  Expected to improve on transition to oral torsemide.  Recent Labs    05/19/20 0408 05/20/20 0453 05/21/20 0616 05/22/20 0832 09/26/20 1035 10/11/20 1606 10/12/20 0421 10/13/20 0221 10/14/20 0412 10/15/20 0408  BUN 67* 63* 57* 55* 67* 57* 57* 63* 62* 62*  CREATININE 2.60* 2.54* 2.42* 2.07* 2.49* 2.34* 2.41* 2.60* 2.68* 2.71*   Peripheral artery disease/hyperlipidemia -Not on statin.  Presumably due to liver cirrhosis.  H/o LV thrombus -Patient was on anticoagulation in the past which was stopped because of multiple episodes of GI bleeding.  Initial echocardiogram on this admission also suspected presence of LV thrombus because of which he was briefly started on heparin drip.  Repeat echocardiogram could not confirm the presence of LV thrombus.  Heparin drip was hence stopped.  No anticoagulation at discharge.  Chronic anemia  H/O GI bleeding in the past GERD -Continue PPI -No active bleeding on this admission. 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  Impaired mobility -PT eval obtained.  Home health PT recommended  Poor prognosis -I discussed with patient and his sister on the phone about patient's having advanced CHF and liver cirrhosis which are two end-stage organ dysfunction.  Patient  however seems compensated and is not in distress.  He is not considering hospice care at this time.  We will discharge him this time with home health PT services.  Patient said he and and his sister will talk about hospice care if he continues to get worse.  Wound care: Wound / Incision (Open or Dehisced) Non-pressure wound Pretibial Right;Mid old weeping wound (Active)  No Date First Assessed or Time First Assessed found.   Wound Type: Non-pressure wound  Location: Pretibial  Location Orientation: Right;Mid  Wound Description (Comments): old weeping wound  Present on Admission: Yes    Assessments 10/14/2020  9:00 AM 10/14/2020  9:15 PM  Dressing Type Alginate Alginate  Dressing Changed Changed Reinforced  Dressing Status Old drainage Dry;Intact  Dressing Change Frequency -- PRN  Site / Wound Assessment Dry;Pink Dressing in place / Unable to assess  Peri-wound Assessment Intact --  Drainage Amount None --  Treatment Cleansed;Other (Comment) --     No Linked orders to display     Wound / Incision (Open or Dehisced) Non-pressure wound Pretibial Left old weeping wound (Active)  No Date First Assessed or Time First Assessed found.   Wound Type: Non-pressure wound  Location: Pretibial  Location Orientation: Left  Wound Description (Comments): old weeping wound  Present on Admission: Yes    Assessments 10/14/2020  9:00 AM 10/14/2020  9:15 PM  Dressing Type Alginate Alginate  Dressing Changed Changed Reinforced  Dressing Status Old drainage Clean;Dry  Dressing Change  Frequency -- PRN  Site / Wound Assessment Dry;Pink Dressing in place / Unable to assess  Drainage Amount None --  Treatment Cleansed;Other (Comment) --     No Linked orders to display    Discharge Exam:   Vitals:   10/14/20 2041 10/15/20 0430 10/15/20 1024 10/15/20 1337  BP: 103/61 102/63 102/70 103/66  Pulse: 77 70 79 79  Resp: 18 18 20    Temp: 97.6 F (36.4 C) 97.9 F (36.6 C) 97.7 F (36.5 C) 97.7 F (36.5 C)   TempSrc:  Oral Oral Oral  SpO2: 100% 100% 100% 100%  Weight:      Height:        Body mass index is 24.94 kg/m.  General exam: Pleasant, elderly African-American male.  Not in distress Skin: No rashes, lesions or ulcers. HEENT: Atraumatic, normocephalic, no obvious bleeding Lungs: Clear to auscultation bilaterally CVS: Regular rate and rhythm, no murmur GI/Abd soft, nontender, mild distention because of ascites, bowel sound present CNS: Alert, awake, oriented x3 Psychiatry: Mood appropriate Extremities: No pedal edema, no calf tenderness  Follow ups:   Discharge Instructions    Amb Referral to Palliative Care   Complete by: As directed    Discharge wound care:   Complete by: As directed    Cleanse legs with soap and water and pat dry Apply Aquacel Ag (LAWSON # [id number]) to open legs.  Wrap legs with kerlix from below toes to below knee.  Secure with self adherent COban.  Change Monday and Thursday.   Discharge wound care:   Complete by: As directed    Cleanse legs with soap and water and pat dry Apply Aquacel Ag (LAWSON # [id number]) to open legs.  Wrap legs with kerlix from below toes to below knee.  Secure with self adherent COban.  Change Monday and Thursday.   Increase activity slowly   Complete by: As directed    Increase activity slowly   Complete by: As directed       Follow-up Information    [facility] Follow up.   Why: This is your [facility] agency that will be providing RN and physical therapy services Contact information: [place], [place], [place] [address] Phone: [phone]       Troy Hess., MD Follow up.   Specialty: Family Medicine Contact information: [address] [place]  [place] [address] [phone]        Jerline Pain, MD .   Specialty: Cardiology Contact information: [id number] [place] [address] [phone]        Thompson Grayer, MD .   Specialty: Cardiology Contact information: [address] [place] [address] [phone]               Recommendations for Outpatient Follow-Up:   1. Follow-up with PCP as an outpatient  Discharge Instructions:  Follow with Primary MD Troy Hess., MD in 7 days   Get CBC/BMP checked in next visit within 1 week by PCP or SNF MD ( we routinely change or add medications that can affect your baseline labs and fluid status, therefore we recommend that you get the mentioned basic workup next visit with your PCP, your PCP may decide not to get them or add new tests based on their clinical decision)  On your next visit with your PCP, please Get Medicines reviewed and adjusted.  Please request your PCP  to go over all Hospital Tests and Procedure/Radiological  results at the follow up, please get all Hospital records sent to your Prim MD by signing hospital release before you go home.  Activity: As tolerated with Full fall precautions use walker/cane & assistance as needed  For Heart failure patients - Check your Weight same time everyday, if you gain over 2 pounds, or you develop in leg swelling, experience more shortness of breath or chest pain, call your Primary MD immediately. Follow Cardiac Low Salt Diet and 1.5 lit/day fluid restriction.  If you have smoked or chewed Tobacco in the last 2 yrs please stop smoking, stop any regular Alcohol  and or any Recreational drug use.  If you experience worsening of your admission symptoms, develop shortness of breath, life threatening emergency, suicidal or homicidal thoughts you must seek medical attention immediately by calling 911 or calling your MD immediately  if symptoms less severe.  You Must read complete instructions/literature along with all the possible adverse reactions/side effects for all the Medicines you take and that have been prescribed to you. Take any new Medicines after you have completely understood and accpet all the possible adverse reactions/side effects.    Do not drive, operate heavy machinery, perform activities at heights, swimming or participation in water activities or provide baby sitting services if your were admitted for syncope or siezures until you have seen by Primary MD or a Neurologist and advised to do so again.  Do not drive when taking Pain medications.  Do not take more than prescribed Pain, Sleep and Anxiety Medications  Wear Seat belts while driving.   Please note You were cared for by a hospitalist during your hospital stay. If you have any questions about your discharge medications or the care you received while you were in the hospital after you are discharged, you can call the unit and asked to speak with the hospitalist on call if the hospitalist that took care of you is not available. Once you are discharged, your primary care physician will handle any further medical issues. Please note that NO REFILLS for any discharge medications will be authorized once you are discharged, as it is imperative that you return to your primary care physician (or establish a relationship with a primary care physician if you do not have one) for your aftercare needs so that they can reassess your need for medications and monitor your lab values.    Allergies as of 10/15/2020   No Known Allergies     Medication List    STOP taking these medications   alum & mag hydroxide-simeth 200-200-20 MG/5ML suspension Commonly known as: MAALOX/MYLANTA   doxycycline 100 MG capsule Commonly known as: MONODOX   feeding supplement Liqd     TAKE these medications   aspirin EC 81 MG tablet Take 81 mg by mouth daily. Swallow whole.   carvedilol 3.125 MG tablet Commonly known as: COREG Take 1 tablet (3.125 mg total) by mouth 2 (two) times daily with a meal. What changed: Another medication with the same name was removed. Continue taking this medication, and follow the directions you see here.   pantoprazole 40 MG tablet Commonly known as:  Protonix Take 1 tablet (40 mg total) by mouth daily. What changed: when to take this   torsemide 20 MG tablet Commonly known as: DEMADEX Take 40 mg by mouth daily. What changed: Another medication with the same name was removed. Continue taking this medication, and follow the directions you see here.  Discharge Care Instructions  (From admission, onward)         Start     Ordered   10/15/20 0000  Discharge wound care:       Comments: Cleanse legs with soap and water and pat dry Apply Aquacel Ag (LAWSON # [id number]) to open legs.  Wrap legs with kerlix from below toes to below knee.  Secure with self adherent COban.  Change Monday and Thursday.   10/15/20 1329   10/15/20 0000  Discharge wound care:       Comments: Cleanse legs with soap and water and pat dry Apply Aquacel Ag (LAWSON # [id number]) to open legs.  Wrap legs with kerlix from below toes to below knee.  Secure with self adherent COban.  Change Monday and Thursday.   10/15/20 1347          Time coordinating discharge: 35 minutes  The results of significant diagnostics from this hospitalization (including imaging, microbiology, ancillary and laboratory) are listed below for reference.    Procedures and Diagnostic Studies:   DG Chest Portable 1 View  Result Date: 10/11/2020 CLINICAL DATA:  Shortness of breath EXAM: PORTABLE CHEST 1 VIEW COMPARISON:  May 15, 2020 FINDINGS: There is cardiomegaly with pulmonary vascular normal. There is subtle perihilar opacity on the right. There is a small granuloma in the left mid lung. The lungs elsewhere are clear. No appreciable adenopathy. No bone lesions. IMPRESSION: 1. Ill-defined perihilar opacity on the right, suspicious for focal pneumonia in this area. Lungs otherwise clear except for small calcified granuloma left mid lung. 2.  Generalized cardiomegaly. Electronically Signed   By: Lowella Grip III M.D.   On: 10/11/2020 15:56   ECHOCARDIOGRAM COMPLETE  Result  Date: 10/12/2020    ECHOCARDIOGRAM REPORT   Patient Name:   [name] Date of Exam: 10/12/2020 Medical Rec #:  [id number]        Height:       71.0 in Accession #:    [id number]       Weight:       196.6 lb Date of Birth:  01-26-1941        BSA:          2.093 m Patient Age:    3 years         BP:           114/78 mmHg Patient Gender: M                HR:           103 bpm. Exam Location:  Inpatient Procedure: 2D Echo, 3D Echo, Cardiac Doppler and Color Doppler Indications:    I50.40* Unspecified combined systolic (congestive) and diastolic                 (congestive) heart failure  History:        Patient has prior history of Echocardiogram examinations, most                 recent 06/22/2019. Cardiomyopathy and CHF,                 Signs/Symptoms:Shortness of Breath, Dyspnea and Chest Pain; Risk                 Factors:Hypertension, Diabetes and Dyslipidemia. Covid positive.                 ETOH.  Sonographer:    Roseanna Rainbow RDCS Referring Phys: [id number] [place]  1. Left ventricular ejection fraction, by estimation, is <20%. The left ventricle has severely decreased function. The left ventricle demonstrates global hypokinesis.  2. The left ventricular internal cavity size was severely dilated.  3. Left ventricular diastolic parameters are consistent with Grade II diastolic dysfunction (pseudonormalization).  4. A LV thrombus adherent to the LV apex is visualized and measures 0.63x0.89cm (best seen on clip 52).  5. Right ventricular systolic function is severely reduced. The right ventricular size is mildly-to-moderately enlarged.  6. There is moderately elevated pulmonary artery systolic pressure.  7. Left atrial size was severely dilated.  8. Right atrial size was severely dilated.  9. The mitral valve is grossly normal. Mild to moderate mitral valve regurgitation. 10. Suspect at least mild-to-moderate, eccentric tricuspid valve regurgitation. Likely funcitonal in the setting of annular  dilation. 11. The aortic valve is tricuspid. There is mild calcification of the aortic valve. There is mild thickening of the aortic valve. Aortic valve regurgitation is trivial. Mild aortic valve sclerosis is present, with no evidence of aortic valve stenosis. 12. There is smoke seen in the LV consistent with a low-flow, low--output state Comparison(s): Compared to prior TTE 06/29/19, the RV size has improved with no septal bowing suggestive of improved RV pressures. Otherwise, there is no significant change. FINDINGS  Left Ventricle: A LV thrombus adherent to the LV apex is visualize dand measures 0.63x0.89cm (best seen on clip 52). Left ventricular ejection fraction, by estimation, is <20%. The left ventricle has severely decreased function. The left ventricle demonstrates global hypokinesis. The left ventricular internal cavity size was severely dilated. There is no left ventricular hypertrophy. Left ventricular diastolic parameters are consistent with Grade II diastolic dysfunction (pseudonormalization). Right Ventricle: The right ventricular size is mildly-to-moderately enlarged. No increase in right ventricular wall thickness. Right ventricular systolic function is severely reduced. There is moderately elevated pulmonary artery systolic pressure. The tricuspid regurgitant velocity is 2.76 m/s, and with an assumed right atrial pressure of 15 mmHg, the estimated right ventricular systolic pressure is 76.1 mmHg. Left Atrium: Left atrial size was severely dilated. Right Atrium: Right atrial size was severely dilated. Pericardium: There is no evidence of pericardial effusion. Mitral Valve: The mitral valve is grossly normal. There is mild thickening of the mitral valve leaflet(s). Mild to moderate mitral valve regurgitation. Tricuspid Valve: The tricuspid valve is normal in structure. Tricuspid valve regurgitation suspect moderate, highly eccentric tricuspid regurgitation. Aortic Valve: The aortic valve is  tricuspid. There is mild calcification of the aortic valve. There is mild thickening of the aortic valve. Aortic valve regurgitation is trivial. Mild aortic valve sclerosis is present, with no evidence of aortic valve stenosis. Pulmonic Valve: The pulmonic valve was normal in structure. Pulmonic valve regurgitation is mild. Aorta: The aortic root is normal in size and structure. IAS/Shunts: No atrial level shunt detected by color flow Doppler. Additional Comments: There is a small pleural effusion in the left lateral region.  LEFT VENTRICLE PLAX 2D LVIDd:         6.80 cm      Diastology LVIDs:         6.50 cm      LV e' medial:    3.26 cm/s LV PW:         1.50 cm      LV E/e' medial:  23.4 LV IVS:        1.30 cm      LV e' lateral:   5.87 cm/s LVOT diam:     2.00  cm      LV E/e' lateral: 13.0 LV SV:         44 LV SV Index:   21 LVOT Area:     3.14 cm  LV Volumes (MOD) LV vol d, MOD A2C: 275.0 ml LV vol d, MOD A4C: 287.0 ml LV vol s, MOD A2C: 264.0 ml LV vol s, MOD A4C: 266.0 ml LV SV MOD A2C:     11.0 ml LV SV MOD A4C:     287.0 ml LV SV MOD BP:      13.8 ml RIGHT VENTRICLE            IVC RV S prime:     7.72 cm/s  IVC diam: 2.80 cm LEFT ATRIUM              Index       RIGHT ATRIUM           Index LA diam:        5.00 cm  2.39 cm/m  RA Area:     37.80 cm LA Vol (A2C):   117.0 ml 55.89 ml/m RA Volume:   211.00 ml 100.79 ml/m LA Vol (A4C):   82.1 ml  39.22 ml/m LA Biplane Vol: 100.0 ml 47.77 ml/m  AORTIC VALVE             PULMONIC VALVE LVOT Vmax:   80.20 cm/s  PR End Diast Vel: 2.39 msec LVOT Vmean:  60.300 cm/s LVOT VTI:    0.141 m  AORTA Ao Root diam: 3.80 cm Ao Asc diam:  3.50 cm MITRAL VALVE                 TRICUSPID VALVE MV Area (PHT): 3.31 cm      TR Peak grad:   30.5 mmHg MV Decel Time: 229 msec      TR Vmax:        276.00 cm/s MR Peak grad:    78.5 mmHg MR Mean grad:    51.0 mmHg   SHUNTS MR Vmax:         443.00 cm/s Systemic VTI:  0.14 m MR Vmean:        341.0 cm/s  Systemic Diam: 2.00 cm MR PISA:          1.57 cm MR PISA Eff ROA: 13 mm MR PISA Radius:  0.50 cm MV E velocity: 76.20 cm/s MV A velocity: 50.90 cm/s MV E/A ratio:  1.50 Gwyndolyn Kaufman MD Electronically signed by Gwyndolyn Kaufman MD Signature Date/Time: 10/12/2020/11:51:10 AM    Final      Labs:   Basic Metabolic Panel: Recent Labs  Lab 10/11/20 1606 10/12/20 0421 10/13/20 0221 10/14/20 0412 10/15/20 0408  NA 137 139 137 141 139  K 4.0 4.2 4.1 4.1 4.4  CL 100 101 99 105 103  CO2 26 27 26 25 26   GLUCOSE 114* 107* 111* 88 95  BUN 57* 57* 63* 62* 62*  CREATININE 2.34* 2.41* 2.60* 2.68* 2.71*  CALCIUM 8.5* 8.4* 8.5* 8.4* 8.2*  MG 2.3 2.1 2.1  --   --    GFR Estimated Creatinine Clearance: 24.3 mL/min (A) (by C-G formula based on SCr of 2.71 mg/dL (H)). Liver Function Tests: Recent Labs  Lab 10/11/20 1940 10/12/20 0421  AST 16 14*  ALT 8 7  ALKPHOS 54 46  BILITOT 1.4* 1.7*  PROT 8.7* 7.8  ALBUMIN 2.9* 2.7*   No results for input(s): LIPASE, AMYLASE in the last 168 hours. No  results for input(s): AMMONIA in the last 168 hours. Coagulation profile Recent Labs  Lab 10/11/20 1606  INR 1.3*    CBC: Recent Labs  Lab 10/11/20 1606 10/12/20 0421 10/13/20 0221 10/14/20 0412 10/15/20 0408  WBC 4.0 4.1 4.0 3.0* 4.4  NEUTROABS  --  3.6 3.3 2.3 3.6  HGB 9.1* 8.6* 8.4* 7.7* 7.7*  HCT 30.0* 28.2* 27.4* 25.2* 24.9*  MCV 90.4 91.3 91.9 89.7 89.9  PLT 256 235 254 242 261   Cardiac Enzymes: No results for input(s): CKTOTAL, CKMB, CKMBINDEX, TROPONINI in the last 168 hours. BNP: Invalid input(s): POCBNP CBG: No results for input(s): GLUCAP in the last 168 hours. D-Dimer No results for input(s): DDIMER in the last 72 hours. Hgb A1c No results for input(s): HGBA1C in the last 72 hours. Lipid Profile No results for input(s): CHOL, HDL, LDLCALC, TRIG, CHOLHDL, LDLDIRECT in the last 72 hours. Thyroid function studies No results for input(s): TSH, T4TOTAL, T3FREE, THYROIDAB in the last 72  hours.  Invalid input(s): FREET3 Anemia work up No results for input(s): VITAMINB12, FOLATE, FERRITIN, TIBC, IRON, RETICCTPCT in the last 72 hours. Microbiology Recent Results (from the past 240 hour(s))  Aerobic Culture (superficial specimen)     Status: Abnormal   Collection Time: 10/09/20  8:40 AM   Specimen: Wound  Result Value Ref Range Status   Specimen Description   Final    WOUND LT GREAT TOE Performed at [facility] [address] [place], [place] [address]    Special Requests   Final    NONE Performed at [facility], [place] [address], [place], [place] [address]    Gram Stain   Final    RARE WBC PRESENT, PREDOMINANTLY PMN MODERATE GRAM POSITIVE COCCI IN PAIRS RARE GRAM POSITIVE RODS Performed at [facility], [place] [address], [place], [place] [address]    Culture MULTIPLE ORGANISMS PRESENT, NONE PREDOMINANT (A)  Final   Report Status 10/13/2020 FINAL  Final  SARS CORONAVIRUS 2 (TAT 6-24 HRS) Nasopharyngeal Nasopharyngeal Swab     Status: Abnormal   Collection Time: 10/11/20  4:06 PM   Specimen: Nasopharyngeal Swab  Result Value Ref Range Status   SARS Coronavirus 2 POSITIVE (A) NEGATIVE Final    Comment: (NOTE) SARS-CoV-2 target nucleic acids are DETECTED.  The SARS-CoV-2 RNA is generally detectable in upper and lower respiratory specimens during the acute phase of infection. Positive results are indicative of the presence of SARS-CoV-2 RNA. Clinical correlation with patient history and other diagnostic information is  necessary to determine patient infection status. Positive results do not rule out bacterial infection or co-infection with other viruses.  The expected result is Negative.  Fact Sheet for Patients: SugarRoll.be  Fact Sheet for Healthcare Providers: https://www.woods-mathews.com/  This test is not yet approved or cleared by the [place] FDA and  has been  authorized for detection and/or diagnosis of SARS-CoV-2 by FDA under an Emergency Use Authorization (EUA). This EUA will remain  in effect (meaning this test can be used) for the duration of the COVID-19 declaration under Section 564(b)(1) of the Act, 21 U. S.C. section 360bbb-3(b)(1), unless the authorization is terminated or revoked sooner.   Performed at [facility], [place] [address], [place], [place] [address]      Signed: Terrilee Croak  [facility] 10/15/2020, 2:12 PM

## 2020-10-15 NOTE — TOC Transition Note (Signed)
Transition of Care [facility]) - CM/SW Discharge Note   Patient Details  Name: [name] MRN: [id number] Date of Birth: 04-20-1941  Transition of Care [facility] CM/SW Contact:  Trish Mage, LCSW Phone Number: 10/15/2020, 12:27 PM   Clinical Narrative:  Patient who is stable for discharge is in need of [facility] services, ride home.  [name] states he already has a visiting RN for wounds, but did not know the name of the agency. Through a bit of chart research, discovered he was with [facility] in the past, call with Malachy Mood revealed he still is.  They will support him with RN, PT services. Ride set up through [facility].  No further needs identified.  TOC sign off.      Final next level of care: [place] Barriers to Discharge: No Barriers Identified   Patient Goals and CMS Choice        Discharge Placement                       Discharge Plan and Services                                     Social Determinants of Health (SDOH) Interventions     Readmission Risk Interventions No flowsheet data found.

## 2020-10-15 NOTE — Progress Notes (Signed)
Ambulated patient in room oxygen level  Remained 100% on room air.

## 2020-10-15 NOTE — Progress Notes (Signed)
Cardiology Progress Note  Patient ID: [name] MRN: [id number] DOB: 05-26-1941 Date of Encounter: 10/15/2020  Primary Cardiologist: Candee Furbish, MD  Subjective   Chief Complaint: Doing better.  Ready for discharge.  HPI: Good urine output. Feeling better. Net negative 4L since admission. 9L paracentesis.   ROS:  All other ROS reviewed and negative. Pertinent positives noted in the HPI.     Inpatient Medications  Scheduled Meds: . aspirin EC  81 mg Oral Daily  . carvedilol  3.125 mg Oral BID  . Chlorhexidine Gluconate Cloth  6 each Topical Daily  . sodium chloride flush  3 mL Intravenous Q12H  . torsemide  20 mg Oral Daily   Continuous Infusions: . sodium chloride     PRN Meds: sodium chloride, acetaminophen, ondansetron (ZOFRAN) IV, polyethylene glycol, sodium chloride flush   Vital Signs   Vitals:   10/14/20 2041 10/15/20 0430 10/15/20 1024 10/15/20 1337  BP: 103/61 102/63 102/70 103/66  Pulse: 77 70 79 79  Resp: 18 18 20    Temp: 97.6 F (36.4 C) 97.9 F (36.6 C) 97.7 F (36.5 C) 97.7 F (36.5 C)  TempSrc:  Oral Oral Oral  SpO2: 100% 100% 100% 100%  Weight:      Height:        Intake/Output Summary (Last 24 hours) at 10/15/2020 1341 Last data filed at 10/15/2020 1100 Gross per 24 hour  Intake 120 ml  Output 1175 ml  Net -1055 ml   Last 3 Weights 10/14/2020 10/13/2020 10/12/2020  Weight (lbs) 178 lb 12.7 oz 198 lb 6.6 oz 196 lb 10.4 oz  Weight (kg) 81.1 kg 90 kg 89.2 kg  Some encounter information is confidential and restricted. Go to Review Flowsheets activity to Patton all data.      Telemetry  Overnight telemetry shows SR 70s, brief NSVT which I personally reviewed.     Physical Exam   Vitals:   10/14/20 2041 10/15/20 0430 10/15/20 1024 10/15/20 1337  BP: 103/61 102/63 102/70 103/66  Pulse: 77 70 79 79  Resp: 18 18 20    Temp: 97.6 F (36.4 C) 97.9 F (36.6 C) 97.7 F (36.5 C) 97.7 F (36.5 C)  TempSrc:  Oral Oral Oral  SpO2: 100% 100%  100% 100%  Weight:      Height:         Intake/Output Summary (Last 24 hours) at 10/15/2020 1341 Last data filed at 10/15/2020 1100 Gross per 24 hour  Intake 120 ml  Output 1175 ml  Net -1055 ml    Last 3 Weights 10/14/2020 10/13/2020 10/12/2020  Weight (lbs) 178 lb 12.7 oz 198 lb 6.6 oz 196 lb 10.4 oz  Weight (kg) 81.1 kg 90 kg 89.2 kg  Some encounter information is confidential and restricted. Go to Review Flowsheets activity to Patton all data.    Body mass index is 24.94 kg/m.   General: Well nourished, well developed, in no acute distress Head: Atraumatic, normal size  Eyes: PEERLA, EOMI  Neck: Supple, JVD 7-8 cm H2O Endocrine: No thryomegaly Cardiac: Normal S1, S2; RRR; no murmurs, rubs, or gallops Lungs: diminished breath sounds  Abd: Soft, nontender, no hepatomegaly  Ext: No edema, pulses 2+ Musculoskeletal: No deformities, BUE and BLE strength normal and equal Skin: Warm and dry, no rashes   Neuro: Alert and oriented to person, place, time, and situation, CNII-XII grossly intact, no focal deficits  Psych: Normal mood and affect   Labs  High Sensitivity Troponin:  No results for input(s): TROPONINIHS in  the last 720 hours.   Cardiac EnzymesNo results for input(s): TROPONINI in the last 168 hours. No results for input(s): TROPIPOC in the last 168 hours.  Chemistry Recent Labs  Lab 10/11/20 1940 10/12/20 0421 10/13/20 0221 10/14/20 0412 10/15/20 0408  NA  --  139 137 141 139  K  --  4.2 4.1 4.1 4.4  CL  --  101 99 105 103  CO2  --  27 26 25 26   GLUCOSE  --  107* 111* 88 95  BUN  --  57* 63* 62* 62*  CREATININE  --  2.41* 2.60* 2.68* 2.71*  CALCIUM  --  8.4* 8.5* 8.4* 8.2*  PROT 8.7* 7.8  --   --   --   ALBUMIN 2.9* 2.7*  --   --   --   AST 16 14*  --   --   --   ALT 8 7  --   --   --   ALKPHOS 54 46  --   --   --   BILITOT 1.4* 1.7*  --   --   --   GFRNONAA  --  27* 25* 24* 23*  ANIONGAP  --  11 12 11 10     Hematology Recent Labs  Lab 10/13/20 0221  10/14/20 0412 10/15/20 0408  WBC 4.0 3.0* 4.4  RBC 2.98* 2.81* 2.77*  HGB 8.4* 7.7* 7.7*  HCT 27.4* 25.2* 24.9*  MCV 91.9 89.7 89.9  MCH 28.2 27.4 27.8  MCHC 30.7 30.6 30.9  RDW 18.4* 18.5* 18.4*  PLT 254 242 261   BNP Recent Labs  Lab 10/11/20 1606  BNP 4,154.2*    DDimer No results for input(s): DDIMER in the last 168 hours.   Radiology  US Paracentesis  Result Date: 10/13/2020 INDICATION: Ascites, history of CHF EXAM: ULTRASOUND GUIDED therapeutic PARACENTESIS MEDICATIONS: 10 mL 1% lidocaine COMPLICATIONS: None immediate. PROCEDURE: Informed written consent was obtained from the patient after a discussion of the risks, benefits and alternatives to treatment. A timeout was performed prior to the initiation of the procedure. Initial ultrasound scanning demonstrates a large amount of ascites within the left lower abdominal quadrant. The left lower abdomen was prepped and draped in the usual sterile fashion. 1% lidocaine was used for local anesthesia. Following this, a 19 gauge, 10-cm, Yueh catheter was introduced. An ultrasound image was saved for documentation purposes. The paracentesis was performed. The catheter was removed and a dressing was applied. The patient tolerated the procedure well without immediate post procedural complication. Patient received post-procedure intravenous albumin; Patton nursing notes for details. FINDINGS: A total of approximately 9.4 L of yellow cloudy fluid was removed. Samples were not sent to the laboratory as requested by the clinical team. IMPRESSION: Successful ultrasound-guided paracentesis yielding 9.4 liters of peritoneal fluid. Read by: Durenda Guthrie, PA-C Electronically Signed   By: Jacqulynn Cadet M.D.   On: 10/13/2020 16:25   ECHOCARDIOGRAM LIMITED  Result Date: 10/14/2020    ECHOCARDIOGRAM LIMITED REPORT   Patient Name:   [name] Date of Exam: 10/14/2020 Medical Rec #:  [id number]        Height:       71.0 in Accession #:    [id number]        Weight:       178.8 lb Date of Birth:  1941-05-01        BSA:          2.010 m Patient Age:    80 years  BP:           108/60 mmHg Patient Gender: M                HR:           68 bpm. Exam Location:  Inpatient Procedure: Limited Echo, Color Doppler, Intracardiac Opacification Agent and            Cardiac Doppler Indications:    LV (left ventricular) mural thrombus [[id number]]  History:        Patient has prior history of Echocardiogram examinations, most                 recent 10/12/2020. CHF, PAD; Risk Factors:Hypertension, Diabetes                 and Dyslipidemia. COVID. ETOH. Cardiomyopathy. Chronic kidney                 disease.  Sonographer:    Darlina Sicilian RDCS Referring Phys: [id number] [place]  1. No left ventricular thrombus with Definity contrast. Left ventricular ejection fraction, by estimation, is <20%. The left ventricle has severely decreased function. The left ventricle demonstrates global hypokinesis. The left ventricular internal cavity size was severely dilated.  2. Right ventricular systolic function is moderately reduced. The right ventricular size is severely enlarged. There is moderately elevated pulmonary artery systolic pressure. The estimated right ventricular systolic pressure is 40.9 mmHg.  3. Left atrial size was severely dilated.  4. Right atrial size was severely dilated.  5. The mitral valve is normal in structure. Mild mitral valve regurgitation.  6. The aortic valve is tricuspid. Aortic valve regurgitation is not visualized. Mild aortic valve sclerosis is present, with no evidence of aortic valve stenosis. Comparison(s): Prior images reviewed side by side. No thrombus is seen in the left ventricle with Definity contrast, on the current images. FINDINGS  Left Ventricle: No left ventricular thrombus with Definity contrast. Left ventricular ejection fraction, by estimation, is <20%. The left ventricle has severely decreased function. The left ventricle  demonstrates global hypokinesis. The left ventricular  internal cavity size was severely dilated. There is no left ventricular hypertrophy. Abnormal (paradoxical) septal motion, consistent with left bundle branch block. Right Ventricle: The right ventricular size is severely enlarged. No increase in right ventricular wall thickness. Right ventricular systolic function is moderately reduced. There is moderately elevated pulmonary artery systolic pressure. The tricuspid regurgitant velocity is 3.01 m/s, and with an assumed right atrial pressure of 15 mmHg, the estimated right ventricular systolic pressure is 73.5 mmHg. Left Atrium: Left atrial size was severely dilated. Right Atrium: Right atrial size was severely dilated. Pericardium: There is no evidence of pericardial effusion. Mitral Valve: The mitral valve is normal in structure. Mild mitral valve regurgitation. Tricuspid Valve: The tricuspid valve is normal in structure. Tricuspid valve regurgitation is mild. Aortic Valve: The aortic valve is tricuspid. Aortic valve regurgitation is not visualized. Mild aortic valve sclerosis is present, with no evidence of aortic valve stenosis. Pulmonic Valve: The pulmonic valve was normal in structure. Pulmonic valve regurgitation is mild. Aorta: The aortic root and ascending aorta are structurally normal, with no evidence of dilitation. IAS/Shunts: The interatrial septum was not assessed.  LV Volumes (MOD) LV vol d, MOD A2C: 333.0 ml LV vol d, MOD A4C: 351.0 ml LV vol s, MOD A2C: 267.0 ml LV vol s, MOD A4C: 294.0 ml LV SV MOD A2C:     66.0 ml LV SV MOD A4C:  351.0 ml LV SV MOD BP:      59.6 ml TRICUSPID VALVE TR Peak grad:   36.2 mmHg TR Vmax:        301.00 cm/s Sanda Klein MD Electronically signed by Sanda Klein MD Signature Date/Time: 10/14/2020/9:58:28 AM    Final     Cardiac Studies  TTE 10/14/2020  1. No left ventricular thrombus with Definity contrast. Left ventricular  ejection fraction, by estimation,  is <20%. The left ventricle has severely  decreased function. The left ventricle demonstrates global hypokinesis.  The left ventricular internal  cavity size was severely dilated.  2. Right ventricular systolic function is moderately reduced. The right  ventricular size is severely enlarged. There is moderately elevated  pulmonary artery systolic pressure. The estimated right ventricular  systolic pressure is 76.7 mmHg.  3. Left atrial size was severely dilated.  4. Right atrial size was severely dilated.  5. The mitral valve is normal in structure. Mild mitral valve  regurgitation.  6. The aortic valve is tricuspid. Aortic valve regurgitation is not  visualized. Mild aortic valve sclerosis is present, with no evidence of  aortic valve stenosis.   Patient Profile  [name] is a 80 y.o. 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  Also found to have COVID-19 pneumonia.  Assessment & Plan   1. Acute on chronic systolic heart failure, EF 10 to 15%, end-stage cardiomyopathy -Effectively euvolemic.  Would recommend to discharge on 20 mg of torsemide daily.  He can take an additional 20 mg as needed for edema or shortness of breath. -Continue Coreg 3.125 mg twice daily.  Continue hydralazine 10 mg 3 times daily, Imdur 15 mg daily. -Due to significant CKD, medication noncompliance liver disease he is not a candidate for advanced therapies.  Seems to be tolerating medical therapy okay.  He will follow with his primary cardiologist Dr. Loraine Grip at [facility]. -I did discuss that he is not a candidate for any advanced options.  2.  LV thrombus? -History of LV thrombus was on Coumadin at one point.  Echocardiogram here  without evidence of thrombus.  Due to GI bleeding in the past and medication noncompliance he has been off of anticoagulation.  Given that his echocardiogram showed no evidence of LV thrombus I would recommend to avoid anticoagulation moving forward.  I do not think he is a good long-term candidate for this.  [facility] will sign off.   Medication Recommendations: Heart pressure medications as above Other recommendations (labs, testing, etc): None Follow up as an outpatient: He will follow up with Dr. Loraine Grip at [facility]  For questions or updates, please contact [place] Please consult [email or web address] for contact info under   Time Spent with Patient: I have spent a total of 15 minutes with patient reviewing hospital notes, telemetry, EKGs, labs and examining the patient as well as establishing an assessment and plan that was discussed with the patient.  > 50% of time was spent in direct patient care.    Signed, Addison Naegeli. Audie Box, [place]  10/15/2020 1:41 PM

## 2020-10-16 ENCOUNTER — Encounter (HOSPITAL_BASED_OUTPATIENT_CLINIC_OR_DEPARTMENT_OTHER): Payer: Medicare Other | Admitting: Internal Medicine

## 2020-10-23 ENCOUNTER — Encounter (HOSPITAL_BASED_OUTPATIENT_CLINIC_OR_DEPARTMENT_OTHER): Payer: Medicare Other | Admitting: Internal Medicine

## 2020-10-23 ENCOUNTER — Other Ambulatory Visit: Payer: Self-pay

## 2020-10-23 DIAGNOSIS — E11622 Type 2 diabetes mellitus with other skin ulcer: Secondary | ICD-10-CM | POA: Diagnosis not present

## 2020-10-23 DIAGNOSIS — E1122 Type 2 diabetes mellitus with diabetic chronic kidney disease: Secondary | ICD-10-CM | POA: Diagnosis not present

## 2020-10-23 DIAGNOSIS — I87323 Chronic venous hypertension (idiopathic) with inflammation of bilateral lower extremity: Secondary | ICD-10-CM | POA: Diagnosis not present

## 2020-10-23 DIAGNOSIS — I890 Lymphedema, not elsewhere classified: Secondary | ICD-10-CM | POA: Diagnosis not present

## 2020-10-23 DIAGNOSIS — E1151 Type 2 diabetes mellitus with diabetic peripheral angiopathy without gangrene: Secondary | ICD-10-CM | POA: Diagnosis not present

## 2020-10-23 DIAGNOSIS — I132 Hypertensive heart and chronic kidney disease with heart failure and with stage 5 chronic kidney disease, or end stage renal disease: Secondary | ICD-10-CM | POA: Diagnosis not present

## 2020-10-23 DIAGNOSIS — N186 End stage renal disease: Secondary | ICD-10-CM | POA: Diagnosis not present

## 2020-10-23 DIAGNOSIS — Z7901 Long term (current) use of anticoagulants: Secondary | ICD-10-CM | POA: Diagnosis not present

## 2020-10-23 DIAGNOSIS — L97812 Non-pressure chronic ulcer of other part of right lower leg with fat layer exposed: Secondary | ICD-10-CM | POA: Diagnosis not present

## 2020-10-23 DIAGNOSIS — L97822 Non-pressure chronic ulcer of other part of left lower leg with fat layer exposed: Secondary | ICD-10-CM | POA: Diagnosis not present

## 2020-10-23 DIAGNOSIS — I5022 Chronic systolic (congestive) heart failure: Secondary | ICD-10-CM | POA: Diagnosis not present

## 2020-10-23 DIAGNOSIS — S90812A Abrasion, left foot, initial encounter: Secondary | ICD-10-CM | POA: Diagnosis not present

## 2020-10-23 DIAGNOSIS — X58XXXA Exposure to other specified factors, initial encounter: Secondary | ICD-10-CM | POA: Diagnosis not present

## 2020-10-23 NOTE — Progress Notes (Signed)
[name], [name] ([id number]) Visit Report for 10/23/2020 Debridement Details Patient Name: Date of Service: [name], [name] 10/23/2020 7:30 A M Medical Record Number: [id number] Patient Account Number: [id number] Date of Birth/Sex: Treating RN: 07-23-1941 (80 y.o. [name] Primary Care Provider: [name]., RO [name] Other Clinician: Referring Provider: Treating Provider/Extender: [name], RO [name] Weeks in Treatment: 54 Debridement Performed for Assessment: Wound #15 Left T Great oe Performed By: Physician Ricard Dillon., MD Debridement Type: Debridement Level of Consciousness (Pre-procedure): Awake and Alert Pre-procedure Verification/Time Out Yes - 08:00 Taken: Start Time: 08:01 Pain Control: Lidocaine 4% T opical Solution T Area Debrided (L x W): otal 1 (cm) x 2.1 (cm) = 2.1 (cm) Tissue and other material debrided: Viable, Non-Viable, Slough, Skin: Dermis , Skin: Epidermis, Fibrin/Exudate, Slough Level: Skin/Epidermis Debridement Description: Selective/Open Wound Instrument: Curette Bleeding: Minimum Hemostasis Achieved: Pressure End Time: 08:06 Procedural Pain: 0 Post Procedural Pain: 0 Response to Treatment: Procedure was tolerated well Level of Consciousness (Post- Awake and Alert procedure): Post Debridement Measurements of Total Wound Length: (cm) 1 Width: (cm) 2.1 Depth: (cm) 0.1 Volume: (cm) 0.165 Character of Wound/Ulcer Post Debridement: Improved Post Procedure Diagnosis Same as Pre-procedure Electronic Signature(s) Signed: 10/23/2020 5:09:37 PM By: Linton Ham MD Signed: 10/23/2020 5:39:27 PM By: Deon Pilling Entered By: Deon Pilling on 10/23/2020 08:06:04 -------------------------------------------------------------------------------- HPI Details Patient Name: Date of Service: [place] [name], [name] 10/23/2020 7:30 A M Medical Record Number: [id number] Patient Account Number: [id number] Date of Birth/Sex:  Treating RN: August 21, 1941 (80 y.o. [name], [name] Primary Care Provider: [name]., RO [name] Other Clinician: Referring Provider: Treating Provider/Extender: [name], RO [name] Weeks in Treatment: 74 History of Present Illness HPI Description: ADMISSION 10/11/2019 This is a 80 year old man with a history of a severe cardiomyopathy with an ejection fraction of about 20%, chronic renal failure stage III. He is listed as a type II diabetic in epic although the patient denies this. He also has a history of PVD. He states for the last month he has had wounds on his bilateral lower extremities that started off as blisters which denuded. He has areas on the left lateral calf and 2 on the right lateral. He has an area on the left first met head which he did not know was there he we identified this on intake. He has been using Silvadene cream provided by his primary care physician but he is complaining that this burns. 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 He was acutely ill at that time complicating GI bleed. ABIs in our clinic were 1.16 on the right and 1.13 on the left 2/25; the patient comes in with his areas on the left lateral and right lateral calf. There is also an area over the left first MTP bunion deformity. We have been using Sorbact. His edema control is fairly good 3/4; left lateral and right lateral calf. Most of his wounds are in the same position tightly adherent nonviable debris. On the right we debrided the superior wound on the left both wounds. The area on his bunion over the left first MTP medially is I  think just about closed. We have been using Sorbact without a lot of success changed to Iodoflex under compression 3/11; left lateral and right lateral calf perhaps minor improvement in the surface condition. We have been using Iodoflex. The area over the bunion of the left first MTP has closed over 3/18; left lateral and right lateral calf not much improvement. We have been using Iodoflex. Aggressive debridement last week. 3/25; left lateral and right lateral calf. We have been using Iodoflex. There is some improvement in the superior area on the right and 2 on the lateral left although there is still a lot of debris on the surface. The inferior area on the right is still a completely nonviable surface. 4/8; left lateral and right lateral calfs. Essentially mirror-image looking wounds 2 wounds on each side in close juxtaposition we have been using Iodoflex with some improvement in the very adherent fibrinous debris but not a lot. The patient has arterial studies next Wednesday morning and venous studies next Thursday morning. I have been avoiding any further aggressive debridement until we see the arterial study results. His ABIs were fairly good in this clinic and is dorsalis pedis pulses are palpable but the wound beds are pale. 4/15; left lateral and right lateral calfs. Essentially mirror-image wounds with 2 wounds on each side in close juxtaposition but with rims of separating normal tissue. We have been using Iodoflex. The base of the wounds has been cleaning up quite nicely ARTERIAL STUDIES were done showing the patient had an ABI on the right of 1.27 with triphasic waveforms and a TBI of 0.90 on the left triphasic waveforms with an ABI of 1.28 and a TBI of 0.87. No evidence of arterial disease VENOUS REFLUX STUDIES; were done yesterday we do not have this report yet. 4/23; VENOUS REFLUX STUDIES did not show any significant reflux right lower extremity. No evidence of a DVT He did have  significant reflux in the common . femoral vein on the left but nothing else was listed as significant. He did not have a DVT. We have been using Iodoflex under compression his wounds are making progress. 4/29; improvements in the wound surface continue. We have been using Iodoflex. He has a 20% out-of-pocket co-pay for Apligraf which is unfortunate. We changed him to Sorbact today 5/6; started on Sorbact last week. Better looking wound surface but not much change in dimensions. 01/24/20-Patient is back at 3 weeks, wound surfaces are about the same but very minimal slough on the right leg wounds, however there is blistering on both legs adjacent to the wounds, patient denies any other symptoms or new symptoms. His studies have been reviewed and do not indicate any significant arterial or venous disease. But he is attending once in 3 weeks with home health changing in between 6/3; patient has 4 wounds 2 on each side of his lateral lower legs. These wounds are somewhat improved. He apparently arrived in clinic last week with a large blister on the right lateral lower leg that had denuded into the wound. They changed to silver alginate. Still under compression. He has straight Medicare and unfortunately has an unlimited co-pay for advanced treatment options. 6/10; his original 4 wounds are all better especially on the left lateral lower leg. Areas on the right medial have a better looking surface were using silver collagen The blister on the right lateral lower leg from last week has an open area however this looks fairly healthy were using silver alginate on this He comes  in today having "stubbed" his left second toe he has an abrasion at the base of the nailbed I think he probably caught the nail which is mycotic. 6/24; blister on the right lateral lower leg has healed. There is no additional wounds. The traumatic left second toe is also closed. The 4 that he has are all better 7/1; all of the  patient's wounds are smaller except for the proximal one on the left lateral calf. We are using silver collagen 7/22; patient has home health. Relatively refractory wounds on the lateral part of both mid calfs. Each of them 2 wounds. The areas on the left are doing much better in fact the distal 1 looks like it is on his way to closure. The right required debridement with adherent fibrinous debris under illumination. We have been using silver collagen. Previous arterial studies were within normal limits he also had venous reflux studies 04/03/2020 upon evaluation today patient appears to be doing excellent with regard to his lower extremity ulcers. He is going to require a little bit of debridement on the wounds on the right especially in order to clear away some biofilm and slough but this is minimal and overall he seems to be doing quite well. 8/19; the patient's wounds on the left lateral lower leg just above closed. However the 2 on the lateral part of the right have a nonviable necrotic surface. This is disappointing. There is also no change in dimensions. He has had arterial studies as well as venous reflux studies 8/26; very disappointing today. Even the more superficial areas on the left are about the same as last week. Still 2 punched-out areas completely unchanged on the right. We have been using silver collagen really making no progress. 9/2; changed to Iodoflex last week better looking wound surfaces especially on the right although they are deep and punched out. The areas on the left are more superficial open 1 of these is almost fully epithelialized although it has been this way for at least 2 weeks. He has a 20% co-pay [Medicare only] unaffordable for a skin substitute. Had some thought about a snap VAC on the deep areas on the right leg we will try to put this through in the insurance 9/9; punched-out wounds on the bilateral lateral lower extremities. We have been operating as if the these  were chronic venous wounds with secondary lymphedema. The areas on the left have been doing well in fact one of them is closed over. We have had no improvement in the areas on the right we have been using Iodoflex to help with surface debridement. The areas on the right are deeper wider. I do not see any evidence of infection. Home health is not been putting the wraps on high enough he has localized lymphedema right above the wounds. He has had arterial and venous studies already 9/16; punched-out mirror-image wounds on his bilateral lateral mid calfs. We have 1 closed on the left lateral the other smaller. For the first time today the areas on the right lateral actually looks some better. I did a biopsy of 1 of these last time although we still do not have this result. HOWEVER he arrives in clinic today complaining of chest pain overnight which felt like a brick on his chest. He also had 2 black watery bowel movements. He does not have nausea or vomiting. He is not describing abdominal pain. He has no prior history of diarrhea heartburn etc. 9/30; sent this patient to the ER  on 9/16. 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 He is on twice daily PPIs Protonix. His admission hemoglobin was exceptionally low at 5. 8.. Discharged at 8.1. 4 unit transfusion. 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 His diuretic dose was actually decreased because of elevated creatinine currently at torsemide 20 mg not felt to be a Coumadin candidate. Patient states he feels well. The areas on his left leg are healed. The areas on the right look better. These have been very refractory wounds. He does not have stockings we will make arrangements for this today 10/7; the areas on the left leg are healed we will transition him into his 20/30 stocking today. Continued improvement in the areas on the right lateral leg 10/21;  the areas on the left leg remain healed at this 2-week follow-up however there is a history that he developed some drainage from one of the wound sites with home health therefore going ahead and wrapping him. He was supposed to be on 20/30 stockings although the history here is vague and I am not sure that this represents a stocking failure or not. We have been wrapping his right leg with the one remaining wound is the distal wound. We have been using silver collagen 10/28 the patient only has 1 wound remaining on the right lower calf. This still has 3 mm of depth which is unchanged but the surface area is smaller. The superior wound on the right lateral is closed. The 2 wounds on the left remain closed and he is using his compression stocking 11/4 1 remaining wound on the right lower calf. Under illumination not a viable surface we have been using silver collagen we had good effect on the other areas but this 1 appears to be stalled. There is no open area on the left leg he is using his own stocking. 11/11; 1 remaining wound on the right lower calf. Surface of this is a lot better than last week but still requiring debridement I am using Iodoflex but looking forward to changing the primary dressing to perhaps endoform 11/18; 1 remaining wound on the right lower leg, venous insufficiency, vigorous debridement last week/Iodoflex. Depth of the wound is therefore larger but the wound looks clean. Still very gritty material at the surface requiring debridement 12/2; the remaining wound on his right lower leg, venous insufficiency. I changed him to endoform 2 weeks ago. We appear to be making nice progress. Wound is measuring smaller 12/16; he comes in today with one of his two wounds on the left opened infected second area on the left may be threatened as well as the healed one on the right. He has edema in both legs which I think is pitting. He says his weights are stable he has unknown fairly severe  cardiomyopathy. His cardiologist is Dr. Brigitte Pulse it does not sound like he is really been carefully followed 12/23; the areas that I was concerned about on the left look a lot better we put some silver alginate and put him back in compression. It may be the compression that actually does the trick here. The remaining open area we have been using Iodoflex and again this looks a lot better. At our suggestion he went to see his primary who did not adjust his diuretic he is still apparently taking 20 mg of a loop diuretic and "sometimes" 40 mg is basically what  the patient stated 1/6; the area on the left is closed once again. We will transition him into his 20/30 stocking from elastic therapy both areas on the right are now open which is a deterioration from last time at which time the only 1 was open but they are very superficial. We have been using Hydrofera Blue under compression 1/27; its been 3 weeks since the patient was here. We transitioned him to a 20/30 stocking on the left leg. Still under 4-layer compression on the right. He comes in today with a new small opening on the left leg but reopening on the right. Massive blistering on the right leg. He has severe systemic fluid volume overload. I have looked over the patient's records. He was seen in 2020 by tele health by cardiology. 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 I think he was supposed to have follow-up but that follow-up never seems to have happened. He does not complain of chest pain or shortness of breath but notes increasing edema not just in his lower legs 2/3; he has new open areas on the bilateral anterior lower legs. I think a lot of this is because of systemic fluid volume overload. The ultrasound I ordered showed ascites and suggestion of cirrhosis. His physical exam continues to suggest right greater than left congestive heart failure. I have him taking 40 mg of Lasix oral  daily I have asked him to start weighing himself daily. With the intense efforts of our nursing staff we have him a cardiology appointment on Monday or Tuesday of next week I have told him not to miss this. With regards to his wounds I do not think we are going to be able to stop wrapping him until somebody manages to get his fluid volume down 2/10; the areas on his bilateral lateral lower legs actually look better today. He has been taking Lasix 40 mg a day I believe [2 x 20 mg]. He seems to have less edema in his legs and and is a result of this I think he has less edema in his lower legs as well and the wounds appear to be doing better. He comes in today with purulence coming out of the toenail bed of the left great toe it appears that his toenail came off there was noted to be pus I did a culture of this area 2/24; since the patient was last here 2 weeks ago he was admitted to hospital from 2/12 through 2/16 predominantly acute on chronic systolic heart failure. He also has stage IV chronic kidney disease discharge creatinine at 2.71 and on top of this morphology of the liver on ultrasound suggested cirrhosis of unclear etiology. He had 9.4 L of ascites tapped I am unclear whether this was sent for analysis at the time of this dictation. He is currently taking 40 mg of Demadex a day I am a bit surprised that was so low. Nevertheless the wounds on his legs are a lot better everything is closed on the right he still has an open area on the left and then the area on the great toe nail matrix on the left from last time. Electronic Signature(s) Signed: 10/23/2020 5:09:37 PM By: Linton Ham MD Entered By: Linton Ham on 10/23/2020 08:07:29 -------------------------------------------------------------------------------- Physical Exam Details Patient Name: Date of Service: [place] 10/23/2020 7:30 A M Medical Record Number: [id number] Patient Account Number: [id number] Date of  Birth/Sex: Treating  RN: Jun 02, 1941 (80 y.o. [name], [name] Primary Care Provider: [name]., RO [name] Other Clinician: Referring Provider: Treating Provider/Extender: [name], RO [name] Weeks in Treatment: 46 Constitutional Sitting or standing Blood Pressure is within target range for patient.. Pulse regular and within target range for patient.Marland Kitchen Respirations regular, non-labored and within target range.. Temperature is normal and within the target range for the patient.Marland Kitchen Appears in no distress. Notes Wound exam; dramatic improvement in his wounds although he still has significant pitting edema in the right leg and I am concerned about increasing edema leading to skin breakdown. On the left leg he has a small open area on the anterior lateral mid part of the leg. The toenail matrix on the left first toe has necrotic debris that I removed with a #5 curette. This was because of toenail dehiscence of a thick mycotic nail Electronic Signature(s) Signed: 10/23/2020 5:09:37 PM By: Linton Ham MD Entered By: Linton Ham on 10/23/2020 08:08:29 -------------------------------------------------------------------------------- Physician Orders Details Patient Name: Date of Service: [place] 10/23/2020 7:30 A M Medical Record Number: [id number] Patient Account Number: [id number] Date of Birth/Sex: Treating RN: [birth year] (80 y.o. 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 Bathing/ Shower/ Hygiene May shower with protection but do not get wound dressing(s) wet. May shower and wash wound with soap and water. - with dressing changes only. Edema Control - Lymphedema / SCD / Other Elevate legs to the level of the heart or above for 30 minutes daily and/or when sitting, a frequency of: - 3-4 times a day throughout the day. Avoid standing for long periods of time. Exercise regularly Non Wound Condition Right Lower Extremity Other Non Wound Condition Orders/Instructions: - 4 layer compression to right leg apply lotion. Home Health New wound care orders this week; continue Home Health for wound care. May utilize formulary equivalent dressing for wound treatment orders unless otherwise specified. - [facility] home [facility]. Wound Treatment Wound #14 - Lower Leg Wound Laterality: Left, Lateral Cleanser: Soap and Water (Home Health) 2 x Per Week/30 Days Discharge Instructions: May shower and wash wound with dial antibacterial soap and water prior to dressing change. Peri-Wound Care: Sween Lotion (Moisturizing lotion) (Home Health) 2 x Per Week/30 Days Discharge Instructions: Apply moisturizing lotion as directed Peri-Wound Care: Triamcinolone 15 (g) 2 x Per Week/30 Days Discharge Instructions: Mixed with lotion in clinic only. Use triamcinolone 15 (g) as directed Prim Dressing: KerraCel Ag Gelling Fiber Dressing, 4x5 in (silver alginate) (Home Health) 2 x Per Week/30 Days ary Discharge Instructions: Apply silver alginate to wound bed as instructed Secondary Dressing: Woven Gauze Sponge, Non-Sterile 4x4 in (Home Health) 2 x Per Week/30 Days Discharge Instructions: Apply over primary dressing as directed. Secondary Dressing: ABD Pad, 8x10 (Home Health) 2 x Per Week/30  Days Discharge Instructions: Apply over primary dressing as directed. Compression Wrap: FourPress (4 layer compression wrap) (Home Health) 2 x Per  Week/30 Days Discharge Instructions: ***Apply to BOTH LEGS.***Apply four layer compression as directed. Wound #15 - T Great oe Wound Laterality: Left Cleanser: Soap and Water (Home Health) 2 x Per Day/15 Days Discharge Instructions: May shower and wash wound with dial antibacterial soap and water prior to dressing change. Topical: Mupirocin Ointment 2 x Per Day/15 Days Discharge Instructions: ***In Clinic only***. Apply Mupirocin (Bactroban) under Alignate Ag. Topical: PolySporin (Home Health) 2 x Per Day/15 Days Discharge Instructions: patient to purchase and home health to apply under the alginate Ag, Prim Dressing: KerraCel Ag Gelling Fiber Dressing, 2x2 in (silver alginate) ([place]) 2 x Per Day/15 Days ary Discharge Instructions: Apply silver alginate to wound bed as instructed Secondary Dressing: Woven Gauze Sponges 2x2 in ([place]) 2 x Per Day/15 Days Discharge Instructions: Apply over primary dressing as directed. Secured With: Child psychotherapist, Sterile 2x75 (in/in) (Home Health) 2 x Per Day/15 Days Discharge Instructions: Secure with stretch gauze as directed. Electronic Signature(s) Signed: 10/23/2020 5:09:37 PM By: Linton Ham MD Signed: 10/23/2020 5:39:27 PM By: Deon Pilling Entered By: Deon Pilling on 10/23/2020 08:22:11 -------------------------------------------------------------------------------- Problem List Details Patient Name: Date of Service: [place] 10/23/2020 7:30 A M Medical Record Number: [id number] Patient Account Number: [id number] Date of Birth/Sex: Treating RN: 10/24/1940 (80 y.o. [name], [name] Primary Care Provider: [name]., RO [name] Other Clinician: Referring Provider: Treating Provider/Extender: [name], RO [name] Weeks in Treatment:  51 Active Problems ICD-10 Encounter Code Description Active Date MDM Diagnosis I87.323 Chronic venous hypertension (idiopathic) with inflammation of bilateral lower 10/11/2019 No Yes extremity I89.0 Lymphedema, not elsewhere classified 10/11/2019 No Yes L97.822 Non-pressure chronic ulcer of other part of left lower leg with fat layer exposed2/06/2020 No Yes L97.812 Non-pressure chronic ulcer of other part of right lower leg with fat layer 01/03/2020 No Yes exposed Q68.34 Chronic systolic (congestive) heart failure 10/02/2020 No Yes S90.812D Abrasion, left foot, subsequent encounter 10/09/2020 No Yes Inactive Problems ICD-10 Code Description Active Date Inactive Date L97.521 Non-pressure chronic ulcer of other part of left foot limited to breakdown of skin 10/11/2019 10/11/2019 S90.812D Abrasion, left foot, subsequent encounter 02/07/2020 02/07/2020 I42.9 Cardiomyopathy, unspecified 05/15/2020 05/15/2020 K92.1 Melena 05/15/2020 05/15/2020 Resolved Problems ICD-10 Code Description Active Date Resolved Date 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 [name], [name] Primary Care Provider: [name]., RO [name] Other Clinician: Referring Provider: Treating Provider/Extender: [name], RO [name] Weeks in Treatment: 62 Subjective History of Present Illness (HPI) ADMISSION 10/11/2019 This is a 80 year old man with a history of a severe cardiomyopathy with an ejection fraction of about 20%, chronic renal failure stage  III. He is listed as a type II diabetic in epic although the patient denies this. He also has a history of PVD. He states for the last month he has had wounds on his bilateral lower extremities that started off as blisters which denuded. He has areas on the left lateral calf and 2 on the right lateral. He has an area on the left first met head which he did not know was there he we identified this on intake. He  has been using Silvadene cream provided by his primary care physician but he is complaining that this burns. 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 He was acutely ill at that time complicating GI bleed. ABIs in our clinic were 1.16 on the right and 1.13 on the left 2/25; the patient comes in with his areas on the left lateral and right lateral calf. There is also an area over the left first MTP bunion deformity. We have been using Sorbact. His edema control is fairly good 3/4; left lateral and right lateral calf. Most of his wounds are in the same position tightly adherent nonviable debris. On the right we debrided the superior wound on the left both wounds. The area on his bunion over the left first MTP medially is I think just about closed. We have been using Sorbact without a lot of success changed to Iodoflex under compression 3/11; left lateral and right lateral calf perhaps minor improvement in the surface condition. We have been using Iodoflex. The area over the bunion of the left first MTP has closed over 3/18; left lateral and right lateral calf not much improvement. We have been using Iodoflex. Aggressive debridement last  week. 3/25; left lateral and right lateral calf. We have been using Iodoflex. There is some improvement in the superior area on the right and 2 on the lateral left although there is still a lot of debris on the surface. The inferior area on the right is still a completely nonviable surface. 4/8; left lateral and right lateral calfs. Essentially mirror-image looking wounds 2 wounds on each side in close juxtaposition we have been using Iodoflex with some improvement in the very adherent fibrinous debris but not a lot. The patient has arterial studies next Wednesday morning and venous studies next Thursday morning. I have been avoiding any further aggressive debridement until we see the arterial study results. His ABIs were fairly good in this clinic and is dorsalis pedis pulses are palpable but the wound beds are pale. 4/15; left lateral and right lateral calfs. Essentially mirror-image wounds with 2 wounds on each side in close juxtaposition but with rims of separating normal tissue. We have been using Iodoflex. The base of the wounds has been cleaning up quite nicely ARTERIAL STUDIES were done showing the patient had an ABI on the right of 1.27 with triphasic waveforms and a TBI of 0.90 on the left triphasic waveforms with an ABI of 1.28 and a TBI of 0.87. No evidence of arterial disease VENOUS REFLUX STUDIES; were done yesterday we do not have this report yet. 4/23; VENOUS REFLUX STUDIES did not show any significant reflux right lower extremity. No evidence of a DVT He did have significant reflux in the common . femoral vein on the left but nothing else was listed as significant. He did not have a DVT. We have been using Iodoflex under compression his wounds are making progress. 4/29; improvements in the wound surface continue. We have been using Iodoflex. He has a 20% out-of-pocket co-pay for Apligraf which is unfortunate. We changed him to Sorbact today 5/6; started on Sorbact last week.  Better looking wound surface but not much change in dimensions.  01/24/20-Patient is back at 3 weeks, wound surfaces are about the same but very minimal slough on the right leg wounds, however there is blistering on both legs adjacent to the wounds, patient denies any other symptoms or new symptoms. His studies have been reviewed and do not indicate any significant arterial or venous disease. But he is attending once in 3 weeks with home health changing in between 6/3; patient has 4 wounds 2 on each side of his lateral lower legs. These wounds are somewhat improved. He apparently arrived in clinic last week with a large blister on the right lateral lower leg that had denuded into the wound. They changed to silver alginate. Still under compression. He has straight Medicare and unfortunately has an unlimited co-pay for advanced treatment options. 6/10; his original 4 wounds are all better especially on the left lateral lower leg. 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 6/24; blister on the right lateral lower leg has healed. There is no additional wounds. The traumatic left second toe is also closed. The 4 that he has are all better 7/1; all of the patient's wounds are smaller except for the proximal one on the left lateral calf. We are using silver collagen 7/22; patient has home health. Relatively refractory wounds on the lateral part of both mid calfs. Each of them 2 wounds. The areas on the left are doing much better in fact the distal 1 looks like it is on his way to closure. The right required debridement with adherent fibrinous debris under illumination. We  have been using silver collagen. Previous arterial studies were within normal limits he also had venous reflux studies 04/03/2020 upon evaluation today patient appears to be doing excellent with regard to his lower extremity ulcers. He is going to require a little bit of debridement on the wounds on the right especially in order to clear away some biofilm and slough but this is minimal and overall he seems to be doing quite well. 8/19; the patient's wounds on the left lateral lower leg just above closed. However the 2 on the lateral part of the right have a nonviable necrotic surface. This is disappointing. There is also no change in dimensions. He has had arterial studies as well as venous reflux studies 8/26; very disappointing today. Even the more superficial areas on the left are about the same as last week. Still 2 punched-out areas completely unchanged on the right. We have been using silver collagen really making no progress. 9/2; changed to Iodoflex last week better looking wound surfaces especially on the right although they are deep and punched out. The areas on the left are more superficial open 1 of these is almost fully epithelialized although it has been this way for at least 2 weeks. He has a 20% co-pay [Medicare only] unaffordable for a skin substitute. Had some thought about a snap VAC on the deep areas on the right leg we will try to put this through in the insurance 9/9; punched-out wounds on the bilateral lateral lower extremities. We have been operating as if the these were chronic venous wounds with secondary lymphedema. The areas  on the left have been doing well in fact one of them is closed over. We have had no improvement in the areas on the right we have been using Iodoflex to help with surface debridement. The areas on the right are deeper wider. I do not see any evidence of infection. Home health is not been putting the wraps on high enough he has localized lymphedema right  above the wounds. He has had arterial and venous studies already 9/16; punched-out mirror-image wounds on his bilateral lateral mid calfs. We have 1 closed on the left lateral the other smaller. For the first time today the areas on the right lateral actually looks some better. I did a biopsy of 1 of these last time although we still do not have this result. HOWEVER he arrives in clinic today complaining of chest pain overnight which felt like a brick on his chest. He also had 2 black watery bowel movements. He does not have nausea or vomiting. He is not describing abdominal pain. He has no prior history of diarrhea heartburn etc. 9/30; sent this patient to the ER on 9/16. 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 He is on twice daily PPIs Protonix. His admission hemoglobin was exceptionally low at 5. 8.. Discharged at 8.1. 4 unit transfusion. 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 His diuretic dose was actually decreased because of elevated creatinine currently at torsemide 20 mg not felt to be a Coumadin candidate. Patient states he feels well. The areas on his left leg are healed. The areas on the right look better. These have been very refractory wounds. He does not have stockings we will make arrangements for this today 10/7; the areas on the left leg are healed we will transition him into his 20/30 stocking today. Continued improvement in the areas on the right lateral leg 10/21; the areas on the left leg remain healed at this 2-week follow-up however there is a history that he developed some drainage from one of the wound sites with home health therefore going ahead and wrapping him. He was supposed to be on 20/30 stockings although the history here is vague and I am not sure that this represents a stocking failure or not. We have been wrapping his right leg with the one remaining wound is  the distal wound. We have been using silver collagen 10/28 the patient only has 1 wound remaining on the right lower calf. This still has 3 mm of depth which is unchanged but the surface area is smaller. The superior wound on the right lateral is closed. The 2 wounds on the left remain closed and he is using his compression stocking 11/4 1 remaining wound on the right lower calf. Under illumination not a viable surface we have been using silver collagen we had good effect on the other areas but this 1 appears to be stalled. There is no open area on the left leg he is using his own stocking. 11/11; 1 remaining wound on the right lower calf. Surface of this is a lot better than last week but still requiring debridement I am using Iodoflex but looking forward to changing the primary dressing to perhaps endoform 11/18; 1 remaining wound on the right lower leg, venous insufficiency, vigorous debridement last week/Iodoflex. Depth of the wound is therefore larger but the wound looks clean. Still very gritty material at the surface requiring  debridement 12/2; the remaining wound on his right lower leg, venous insufficiency. I changed him to endoform 2 weeks ago. We appear to be making nice progress. Wound is measuring smaller 12/16; he comes in today with one of his two wounds on the left opened infected second area on the left may be threatened as well as the healed one on the right. He has edema in both legs which I think is pitting. He says his weights are stable he has unknown fairly severe cardiomyopathy. His cardiologist is Dr. Brigitte Pulse it does not sound like he is really been carefully followed 12/23; the areas that I was concerned about on the left look a lot better we put some silver alginate and put him back in compression. It may be the compression that actually does the trick here. The remaining open area we have been using Iodoflex and again this looks a lot better. At our suggestion he went to see  his primary who did not adjust his diuretic he is still apparently taking 20 mg of a loop diuretic and "sometimes" 40 mg is basically what the patient stated 1/6; the area on the left is closed once again. We will transition him into his 20/30 stocking from elastic therapy both areas on the right are now open which is a deterioration from last time at which time the only 1 was open but they are very superficial. We have been using Hydrofera Blue under compression 1/27; its been 3 weeks since the patient was here. We transitioned him to a 20/30 stocking on the left leg. Still under 4-layer compression on the right. He comes in today with a new small opening on the left leg but reopening on the right. Massive blistering on the right leg. He has severe systemic fluid volume overload. I have looked over the patient's records. He was seen in 2020 by tele health by cardiology. 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 I think he was supposed to have follow-up but that follow-up never seems to have happened. He does not complain of chest pain or shortness of breath but notes increasing edema not just in his lower legs 2/3; he has new open areas on the bilateral anterior lower legs. I think a lot of this is because of systemic fluid volume overload. The ultrasound I ordered showed ascites and suggestion of cirrhosis. His physical exam continues to suggest right greater than left congestive heart failure. I have him taking 40 mg of Lasix oral daily I have asked him to start weighing himself daily. With the intense efforts of our nursing staff we have him a cardiology appointment on Monday or Tuesday of next week I have told him not to miss this. With regards to his wounds I do not think we are going to be able to stop wrapping him until somebody manages to get his fluid volume down 2/10; the areas on his bilateral lateral lower legs actually look better  today. He has been taking Lasix 40 mg a day I believe [2 x 20 mg]. He seems to have less edema in his legs and and is a result of this I think he has less edema in his lower legs as well and the wounds appear to be doing better. He comes in today with purulence coming out of the toenail bed of the left great toe it appears that his toenail came off there was noted to be pus I did  a culture of this area 2/24; since the patient was last here 2 weeks ago he was admitted to hospital from 2/12 through 2/16 predominantly acute on chronic systolic heart failure. He also has stage IV chronic kidney disease discharge creatinine at 2.71 and on top of this morphology of the liver on ultrasound suggested cirrhosis of unclear etiology. He had 9.4 L of ascites tapped I am unclear whether this was sent for analysis at the time of this dictation. He is currently taking 40 mg of Demadex a day I am a bit surprised that was so low. Nevertheless the wounds on his legs are a lot better everything is closed on the right he still has an open area on the left and then the area on the great toe nail matrix on the left from last time. Objective Constitutional Sitting or standing Blood Pressure is within target range for patient.. Pulse regular and within target range for patient.Marland Kitchen Respirations regular, non-labored and within target range.. Temperature is normal and within the target range for the patient.Marland Kitchen Appears in no distress. Vitals Time Taken: 7:39 AM, Height: 71 in, Weight: 198 lbs, BMI: 27.6, Temperature: 97.7 F, Pulse: 92 bpm, Respiratory Rate: 22 breaths/min, Blood Pressure: 107/68 mmHg. General Notes: Wound exam; dramatic improvement in his wounds although he still has significant pitting edema in the right leg and I am concerned about increasing edema leading to skin breakdown. On the left leg he has a small open area on the anterior lateral mid part of the leg. ooThe toenail matrix on the left first toe has  necrotic debris that I removed with a #5 curette. This was because of toenail dehiscence of a thick mycotic nail Integumentary (Hair, Skin) Wound #11 status is Healed - Epithelialized. Original cause of wound was Gradually Appeared. The date acquired was: 09/04/2020. The wound has been in treatment 7 weeks. The wound is located on the Right,Proximal,Lateral Lower Leg. The wound measures 0cm length x 0cm width x 0cm depth; 0cm^2 area and 0cm^3 volume. Wound #12 status is Healed - Epithelialized. Original cause of wound was Gradually Appeared. The date acquired was: 09/25/2020. The wound has been in treatment 4 weeks. The wound is located on the Left,Anterior Lower Leg. The wound measures 0cm length x 0cm width x 0cm depth; 0cm^2 area and 0cm^3 volume. Wound #13 status is Healed - Epithelialized. Original cause of wound was Blister. The date acquired was: 10/02/2020. The wound has been in treatment 3 weeks. The wound is located on the Right,Anterior Lower Leg. The wound measures 0cm length x 0cm width x 0cm depth; 0cm^2 area and 0cm^3 volume. Wound #14 status is Open. Original cause of wound was Blister. The date acquired was: 10/02/2020. The wound has been in treatment 3 weeks. The wound is located on the Left,Lateral Lower Leg. The wound measures 0.8cm length x 0.6cm width x 0.1cm depth; 0.377cm^2 area and 0.038cm^3 volume. There is Fat Layer (Subcutaneous Tissue) exposed. There is no tunneling or undermining noted. There is a medium amount of serosanguineous drainage noted. The wound margin is flat and intact. There is large (67-100%) pink granulation within the wound bed. There is a small (1-33%) amount of necrotic tissue within the wound bed including Adherent Slough. Wound #15 status is Open. Original cause of wound was Gradually Appeared. The date acquired was: 10/09/2020. The wound has been in treatment 2 weeks. The wound is located on the Left T Great. The wound measures 1cm length x 2.1cm width x  0.1cm depth;  1.649cm^2 area and 0.165cm^3 volume. There is oe Fat Layer (Subcutaneous Tissue) exposed. There is no tunneling or undermining noted. There is a medium amount of serosanguineous drainage noted. The wound margin is indistinct and nonvisible. There is small (1-33%) pink granulation within the wound bed. There is a large (67-100%) amount of necrotic tissue within the wound bed including Adherent Slough. Wound #4 status is Healed - Epithelialized. Original cause of wound was Blister. The date acquired was: 09/11/2019. The wound has been in treatment 54 weeks. The wound is located on the Right,Distal,Lateral Lower Leg. The wound measures 0cm length x 0cm width x 0cm depth; 0cm^2 area and 0cm^3 volume. Assessment Active Problems ICD-10 Chronic venous hypertension (idiopathic) with inflammation of bilateral lower extremity Lymphedema, not elsewhere classified Non-pressure chronic ulcer of other part of left lower leg with fat layer exposed Non-pressure chronic ulcer of other part of right lower leg with fat layer exposed Chronic systolic (congestive) heart failure Abrasion, left foot, subsequent encounter Procedures Wound #15 Pre-procedure diagnosis of Wound #15 is an Infection - not elsewhere classified located on the Left T Great . There was a Selective/Open Wound oe Skin/Epidermis Debridement with a total area of 2.1 sq cm performed by Ricard Dillon., MD. With the following instrument(s): Curette to remove Viable and Non-Viable tissue/material. Material removed includes Slough, Skin: Dermis, Skin: Epidermis, and Fibrin/Exudate after achieving pain control using Lidocaine 4% Topical Solution. A time out was conducted at 08:00, prior to the start of the procedure. A Minimum amount of bleeding was controlled with Pressure. The procedure was tolerated well with a pain level of 0 throughout and a pain level of 0 following the procedure. Post Debridement Measurements: 1cm length x 2.1cm  width x 0.1cm depth; 0.165cm^3 volume. Character of Wound/Ulcer Post Debridement is improved. Post procedure Diagnosis Wound #15: Same as Pre-Procedure Wound #14 Pre-procedure diagnosis of Wound #14 is a Venous Leg Ulcer located on the Left,Lateral Lower Leg . There was a Four Layer Compression Therapy Procedure by Baruch Gouty, RN. Post procedure Diagnosis Wound #14: Same as Pre-Procedure There was a Four Layer Compression Therapy Procedure by Baruch Gouty, RN. Post procedure Diagnosis Wound #: Same as Pre-Procedure Plan Follow-up Appointments: Return Appointment in 1 week. Bathing/ Shower/ Hygiene: May shower with protection but do not get wound dressing(s) wet. May shower and wash wound with soap and water. - with dressing changes only. Edema Control - Lymphedema / SCD / Other: Elevate legs to the level of the heart or above for 30 minutes daily and/or when sitting, a frequency of: - 3-4 times a day throughout the day. Avoid standing for long periods of time. Exercise regularly Home Health: New wound care orders this week; continue Home Health for wound care. May utilize formulary equivalent dressing for wound treatment orders unless otherwise specified. - [facility] home [facility]. WOUND #14: - Lower Leg Wound Laterality: Left, Lateral Cleanser: Soap and Water (Home Health) 2 x Per Week/30 Days Discharge Instructions: May shower and wash wound with dial antibacterial soap and water prior to dressing change. Peri-Wound Care: Sween Lotion (Moisturizing lotion) (Home Health) 2 x Per Week/30 Days Discharge Instructions: Apply moisturizing lotion as directed Peri-Wound Care: Triamcinolone 15 (g) 2 x Per Week/30 Days Discharge Instructions: Mixed with lotion in clinic only. Use triamcinolone 15 (g) as directed Prim Dressing: KerraCel Ag Gelling Fiber Dressing, 4x5 in (silver alginate) (Home Health) 2 x Per Week/30 Days ary Discharge Instructions: Apply silver alginate to wound bed as  instructed Secondary Dressing: Woven  Gauze Sponge, Non-Sterile 4x4 in (Home Health) 2 x Per Week/30 Days Discharge Instructions: Apply over primary dressing as directed. Secondary Dressing: ABD Pad, 8x10 (Home Health) 2 x Per Week/30 Days Discharge Instructions: Apply over primary dressing as directed. Com pression Wrap: FourPress (4 layer compression wrap) (Home Health) 2 x Per Week/30 Days Discharge Instructions: ***Apply to BOTH LEGS.***Apply four layer compression as directed. WOUND #15: - T Great Wound Laterality: Left oe Cleanser: Soap and Water (Home Health) 2 x Per XTG/62 Days Discharge Instructions: May shower and wash wound with dial antibacterial soap and water prior to dressing change. Topical: Mupirocin Ointment 2 x Per Day/15 Days Discharge Instructions: ***In Clinic only***. Apply Mupirocin (Bactroban) under Alignate Ag. Topical: PolySporin (Home Health) 2 x Per Day/15 Days Discharge Instructions: patient to purchase and home health to apply under the alginate Ag, Prim Dressing: KerraCel Ag Gelling Fiber Dressing, 2x2 in (silver alginate) ([place]) 2 x Per Day/15 Days ary Discharge Instructions: Apply silver alginate to wound bed as instructed Secondary Dressing: Woven Gauze Sponges 2x2 in ([place]) 2 x Per Day/15 Days Discharge Instructions: Apply over primary dressing as directed. Secured With: Child psychotherapist, Sterile 2x75 (in/in) (Home Health) 2 x Per Day/15 Days Discharge Instructions: Secure with stretch gauze as directed. 1. I put both legs back in compression I am concerned that if he increases fluid in these legs the left further skin breakdown. 2. Using silver alginate on the left lower leg 3. I told the patient he should be following his weights on at least an every other day basis 4. I will try to see if the abdominal fluid was sent for analysis 5. I have asked the patient to bring his stockings next week they are 20/30 below-knee. I am  uncertain whether this will be sufficient to maintain his skin integrity. 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 [name], [name] Primary Care Provider: [name]., RO [name] Other Clinician: Referring Provider: Treating Provider/Extender: [name], RO [name] Weeks in Treatment: 59 Information Obtained From Patient Hematologic/Lymphatic Medical History: Positive for: Anemia Respiratory Medical History: Negative for: Aspiration; Asthma; Chronic Obstructive Pulmonary Disease (COPD); Pneumothorax; Sleep Apnea; Tuberculosis Past Medical History Notes: history of bronchitis acute respiratory failure Cardiovascular Medical History: Positive for: Congestive Heart Failure; Hypertension; Peripheral Venous Disease Past Medical History Notes: cardiomegaly Gastrointestinal Medical History: Past Medical History Notes: ulcers Genitourinary Medical History: Positive for: End Stage Renal Disease Past Medical History Notes: says he is having prostate issues Integumentary (Skin) Medical History: Negative for: History of Burn Immunizations Pneumococcal Vaccine: Received Pneumococcal Vaccination: No Implantable Devices No devices added Hospitalization / Surgery History Type of Hospitalization/Surgery sphincterotomy cholecystostomy 10/11/20 inpatient advanced CHF Family and Social History Cancer: No; Diabetes: No; Heart Disease: Yes - Father,Siblings; Hereditary Spherocytosis: No; Hypertension: Yes - Father,Siblings;  Kidney Disease: No; Lung Disease: No; Seizures: No; Stroke: Yes - Siblings; Thyroid Problems: No; Tuberculosis: No; Former smoker - quit 11 years ago; Alcohol Use: Rarely; Drug Use: No History; Caffeine Use: Never; Financial Concerns: No; Food, Clothing or Shelter Needs: No; Support System Lacking: No; Transportation Concerns: Yes - uses Museum/gallery exhibitions officer) Signed: 10/23/2020 5:09:37 PM  By: Linton Ham MD Signed: 10/23/2020 5:39:27 PM By: Deon Pilling Entered By: Deon Pilling on 10/23/2020 08:30:13 -------------------------------------------------------------------------------- SuperBill Details Patient Name: Date of Service: [place] 10/23/2020 Medical Record Number: [id number] Patient Account Number: [id number] Date of Birth/Sex: Treating RN: [birth year] (80 y.o. [name], [name] Primary Care Provider: [name]., RO [name] Other Clinician: Referring Provider: Treating Provider/Extender: [name], RO [name] Weeks in Treatment: 54 Diagnosis Coding ICD-10 Codes Code Description [phone] Chronic venous hypertension (idiopathic) with inflammation of bilateral lower extremity I89.0 Lymphedema, not elsewhere classified L97.822 Non-pressure chronic ulcer of other part of left lower leg with fat layer exposed L97.812 Non-pressure chronic ulcer of other part of right lower leg with fat layer exposed O67.67 Chronic systolic (congestive) heart failure S90.812D Abrasion, left foot, subsequent encounter Facility Procedures The patient participates with Medicare or their insurance follows the Medicare Facility Guidelines: CPT4 Code Description Modifier Quantity [id number] - DEBRIDE WOUND 1ST 20 SQ CM OR < 1 ICD-10 Diagnosis Description G83.66 Chronic systolic  (congestive) heart failure The patient participates with Medicare or their insurance follows the Medicare Facility Guidelines: [phone] (Facility Use Only) [phone] - APPLY MULTLAY COMPRS LWR RT  LEG 59 1 Physician Procedures : CPT4 Code Description Modifier [id number] - WC PHYS DEBR WO ANESTH 20 SQ CM ICD-10 Diagnosis Description C94.49 Chronic systolic (congestive) heart failure Quantity: 1 Electronic Signature(s) Signed: 10/23/2020 5:09:37 PM By: Linton Ham MD Signed: 10/23/2020 5:39:27 PM By: Deon Pilling Entered By: Deon Pilling on 10/23/2020 09:24:38

## 2020-10-27 NOTE — Progress Notes (Signed)
IAIN, SAWCHUK ([id number]) Visit Report for 10/23/2020 Arrival Information Details Patient Name: Date of Service: [place] 10/23/2020 7:30 A M Medical Record Number: [id number] Patient Account Number: [id number] Date of Birth/Sex: Treating RN: [birth year] (80 y.o. [name] Primary Care Provider: [name]., [name] Other Clinician: Referring Provider: Treating Provider/Extender: [name], [name] in Treatment: 41 Visit Information History Since Last Visit Added or deleted any medications: No Patient Arrived: Ambulatory Any new allergies or adverse reactions: No Arrival Time: 07:39 Had a fall or experienced change in No Accompanied By: self activities of daily living that may affect Transfer Assistance: None risk of falls: Patient Identification Verified: Yes Signs or symptoms of abuse/neglect since last visito No Secondary Verification Process Completed: Yes Hospitalized since last visit: No Patient Requires Transmission-Based Precautions: No Implantable device outside of the clinic excluding No Patient Has Alerts: Yes cellular tissue based products placed in the center Patient Alerts: Patient on Blood Thinner since last visit: Has Dressing in Place as Prescribed: Yes Pain Present Now: No Electronic Signature(s) Signed: 10/23/2020 10:36:55 AM By: Sandre Kitty Entered By: Sandre Kitty on 10/23/2020 07:39:57 -------------------------------------------------------------------------------- Compression Therapy Details Patient Name: Date of Service: [place] [name] [place] 10/23/2020 7:30 A M Medical Record Number: [id number] Patient Account Number: [id number] Date of Birth/Sex: Treating RN: August 09, 1941 (80 y.o. [name] Primary Care Provider: [name]., [name] Other Clinician: Referring Provider: Treating Provider/Extender: [name], [name] in Treatment: 44 Compression Therapy Performed for  Wound Assessment: Wound #14 Left,Lateral Lower Leg Performed By: Clinician Baruch Gouty, RN Compression Type: Four Layer Post Procedure Diagnosis Same as Pre-procedure Electronic Signature(s) Signed: 10/23/2020 5:39:27 PM By: Deon Pilling Entered By: Deon Pilling on 10/23/2020 08:04:59 -------------------------------------------------------------------------------- Compression Therapy Details Patient Name: Date of Service: [name] [facility] 10/23/2020 7:30 A M Medical Record Number: [id number] Patient Account Number: [id number] Date of Birth/Sex: Treating RN: 18-Dec-1940 (80 y.o. [name] Primary Care Provider: [name]., [name] Other Clinician: Referring Provider: Treating Provider/Extender: [name], [name] in Treatment: 56 Compression Therapy Performed for Wound Assessment: NonWound Condition Lymphedema - Right Leg Performed By: Clinician Baruch Gouty, RN Compression Type: Four Layer Post Procedure Diagnosis Same as Pre-procedure Electronic Signature(s) Signed: 10/23/2020 5:39:27 PM By: Deon Pilling Entered By: Deon Pilling on 10/23/2020 08:06:40 -------------------------------------------------------------------------------- Encounter Discharge Information Details Patient Name: Date of Service: [place] 10/23/2020 7:30 A M Medical Record Number: [id number] Patient Account Number: [id number] Date of Birth/Sex: Treating RN: July 14, 1941 (80 y.o. [name] Primary Care Provider: [name]., [name] Other Clinician: Referring Provider: Treating Provider/Extender: [name], [name] in Treatment: 15 Encounter Discharge Information Items Post Procedure Vitals Discharge Condition: Stable Temperature (F): 97.7 Ambulatory Status: Walker Pulse (bpm): 92 Discharge Destination: Home Respiratory Rate (breaths/min): 22 Transportation: Private Auto Blood Pressure (mmHg):  107/68 Accompanied By: alone Schedule Follow-up Appointment: Yes Clinical Summary of Care: Patient Declined Electronic Signature(s) Signed: 10/27/2020 5:56:44 PM By: Troy Hurst RN, BSN Entered By: [name] on 10/23/2020 08:37:08 -------------------------------------------------------------------------------- Lower Extremity Assessment Details Patient Name: Date of Service: [place] 10/23/2020 7:30 A M Medical Record Number: [id number] Patient Account Number: [id number] Date of Birth/Sex: Treating RN: October 30, 1940 (80 y.o. [name], [name] Primary Care Provider: [name]., [name] Other Clinician: Referring Provider: Treating Provider/Extender: Linton Ham  FO STER JR., [name] in Treatment: 54 Edema Assessment Assessed: [Left: Yes] [Right: Yes] Edema: [Left: Yes] [Right: Yes] Calf Left: Right: Point of Measurement: 53 cm From Medial Instep 41 cm 41 cm Ankle Left: Right: Point of Measurement: 14 cm From Medial Instep 22.6 cm 23 cm Knee To Floor Left: Right: From Medial Instep 52 cm Vascular Assessment Pulses: Dorsalis Pedis Palpable: [Left:Yes] [Right:Yes] Electronic Signature(s) Signed: 10/23/2020 5:39:27 PM By: Deon Pilling Entered By: Deon Pilling on 10/23/2020 08:07:06 -------------------------------------------------------------------------------- Multi Wound Chart Details Patient Name: Date of Service: [name] 10/23/2020 7:30 A M Medical Record Number: [id number] Patient Account Number: [id number] Date of Birth/Sex: Treating RN: October 01, 1940 (80 y.o. [name], [name] Primary Care Provider: [name]., [name] Other Clinician: Referring Provider: Treating Provider/Extender: [name], [name] in Treatment: 60 Vital Signs Height(in): 71 Pulse(bpm): 92 Weight(lbs): 198 Blood Pressure(mmHg): 107/68 Body Mass Index(BMI): 28 Temperature(F): 97.7 Respiratory Rate(breaths/min): 22 Photos:  [11:No Photos Right, Proximal, Lateral Lower Leg] [12:No Photos Left, Anterior Lower Leg] [13:No Photos Right, Anterior Lower Leg] Wound Location: [11:Gradually Appeared] [12:Gradually Appeared] [13:Blister] Wounding Event: [11:Venous Leg Ulcer] [12:Venous Leg Ulcer] [13:Venous Leg Ulcer] Primary Etiology: [11:N/A] [12:Lymphedema] [13:N/A] Secondary Etiology: [11:N/A] [12:N/A] [13:N/A] Comorbid History: [11:09/04/2020] [12:09/25/2020] [13:10/02/2020] Date Acquired: [11:7] [12:4] [13:3] Weeks of Treatment: [11:Healed - Epithelialized] [12:Healed - Epithelialized] [13:Healed - Epithelialized] Wound Status: [11:No] [12:No] [13:Yes] Clustered Wound: [11:0x0x0] [12:0x0x0] [13:0x0x0] Measurements L x W x D (cm) [11:0] [12:0] [13:0] A (cm) : rea [11:0] [12:0] [13:0] Volume (cm) : [11:100.00%] [12:100.00%] [13:100.00%] % Reduction in A rea: [11:100.00%] [12:100.00%] [13:100.00%] % Reduction in Volume: [11:Full Thickness Without Exposed] [12:Full Thickness Without Exposed] [13:Full Thickness Without Exposed] Classification: [11:Support Structures N/A] [12:Support Structures N/A] [13:Support Structures N/A] Exudate A mount: [11:N/A] [12:N/A] [13:N/A] Exudate Type: [11:N/A] [12:N/A] [13:N/A] Exudate Color: [11:N/A] [12:N/A] [13:N/A] Wound Margin: [11:N/A] [12:N/A] [13:N/A] Granulation A mount: [11:N/A] [12:N/A] [13:N/A] Granulation Quality: [11:N/A] [12:N/A] [13:N/A] Necrotic A mount: [11:N/A] [12:N/A] [13:N/A] Epithelialization: [11:N/A] [12:N/A] [13:N/A] Wound Number: [id number]  Photos: Photos: No Photos No Photos No Photos Left, Lateral Lower Leg Left T Great oe Right, Distal, Lateral Lower Leg Wound Location: Blister Gradually Appeared Blister Wounding Event: Venous Leg Ulcer Infection - not elsewhere classified Venous Leg Ulcer Primary Etiology: N/A N/A N/A Secondary Etiology: Anemia, Congestive Heart Failure, Anemia, Congestive Heart Failure, N/A Comorbid History: Hypertension, Peripheral  Venous Hypertension, Peripheral Venous Disease, End Stage Renal Disease Disease, End Stage Renal Disease 10/02/2020 10/09/2020 09/11/2019 Date Acquired: 3 2 54 Weeks of Treatment: Open Open Healed - Epithelialized Wound Status: No No No Clustered Wound: 0.8x0.6x0.1 1x2.1x0.1 0x0x0 Measurements L x W x D (cm) 0.377 1.649 0 A (cm) : rea 0.038 0.165 0 Volume (cm) : 60.00% -5.00% 100.00% % Reduction in Area: 79.80% -5.10% 100.00% % Reduction in Volume: Full Thickness Without Exposed Full Thickness Without Exposed Full Thickness Without Exposed Classification: Support Structures Support Structures Support Structures Medium Medium N/A Exudate Amount: Serosanguineous Serosanguineous N/A Exudate Type: red, brown red, brown N/A Exudate Color: Flat and Intact Indistinct, nonvisible N/A Wound Margin: Large (67-100%) Small (1-33%) N/A Granulation Amount: Pink Pink N/A Granulation Quality: Small (1-33%) Large (67-100%) N/A Necrotic Amount: Fat Layer (Subcutaneous Tissue): Yes Fat Layer (Subcutaneous Tissue): Yes N/A Exposed Structures: Fascia: No Fascia: No Tendon: No Tendon: No Muscle: No Muscle: No Joint: No Joint: No Bone: No Bone: No Large (67-100%) None N/A Epithelialization: Compression Therapy N/A N/A Procedures Performed: Treatment Notes Electronic Signature(s)  Signed: 10/23/2020 5:09:37 PM By: Linton Ham MD Signed: 10/23/2020 5:39:27 PM By: Deon Pilling Entered By: Linton Ham on 10/23/2020 08:05:56 -------------------------------------------------------------------------------- Multi-Disciplinary Care Plan Details Patient Name: Date of Service: [place] 10/23/2020 7:30 A M Medical Record Number: [id number] Patient Account Number: [id number] Date of Birth/Sex: Treating RN: [birth year] (80 y.o. [name] Primary Care Provider: [name]., [name] Other Clinician: Referring Provider: Treating Provider/Extender: [name], [name] in Treatment: 82 Multidisciplinary Care Plan reviewed with physician Active Inactive Venous Leg Ulcer Nursing Diagnoses: Knowledge deficit related to disease process and management Potential for venous Insuffiency (use before diagnosis confirmed) Goals: Patient will maintain optimal edema control Date Initiated: 05/01/2020 Target Resolution Date: 11/21/2020 Goal Status: Active Interventions: Assess peripheral edema status every visit. Compression as ordered Provide education on venous insufficiency Notes: Electronic Signature(s) Signed: 10/23/2020 5:39:27 PM By: Deon Pilling Entered By: Deon Pilling on 10/23/2020 07:42:13 -------------------------------------------------------------------------------- Non-Wound Condition Assessment Details Patient Name: Date of Service: [place] 10/23/2020 7:30 A M Medical Record Number: [id number] Patient Account Number: [id number] Date of Birth/Sex: Treating RN: 1941-03-02 (80 y.o. [name] Primary Care Provider: [name]., [name] Other Clinician: Referring Provider: Treating Provider/Extender: [name], [name] in Treatment: 61 Non-Wound Condition: Condition: Lymphedema Location: Leg Side: Right Electronic Signature(s) Signed: 10/23/2020 5:39:27 PM By: Deon Pilling Entered By: Deon Pilling on 10/23/2020 08:06:27 -------------------------------------------------------------------------------- Pain Assessment Details Patient Name: Date of Service: [place] [name] [facility] 10/23/2020 7:30 A M Medical Record Number: [id number] Patient Account Number: [id number] Date of Birth/Sex: Treating RN: Feb 10, 1941 (80 y.o. [name] Primary Care Provider: [name]., [name] Other Clinician: Referring Provider: Treating Provider/Extender: [name], [name] in Treatment: 19 Active Problems Location of Pain Severity and Description  of Pain Patient Has Paino No Site Locations Pain Management and Medication Current Pain Management: Electronic Signature(s) Signed: 10/23/2020 10:36:55 AM By: Sandre Kitty Signed: 10/23/2020 5:39:27 PM By: Deon Pilling Entered By: Sandre Kitty on 10/23/2020 07:40:33 -------------------------------------------------------------------------------- Patient/Caregiver Education Details Patient Name: Date of Service: [place], [place] 2/24/2022andnbsp7:30 [place] Record Number: [id number] Patient Account Number: [id number] Date of Birth/Gender: Treating RN: 28-Jan-1941 (80 y.o. [name] Primary Care Physician: [name]., [name] Other Clinician: Referring Physician: Treating Physician/Extender: [name], [name] in Treatment: 50 Education Assessment Education Provided To: Patient Education Topics Provided Venous: Handouts: Managing Venous Disease and Related Ulcers Methods: Explain/Verbal Responses: Reinforcements needed Electronic Signature(s) Signed: 10/23/2020 5:39:27 PM By: Deon Pilling Entered By: Deon Pilling on 10/23/2020 07:42:28 -------------------------------------------------------------------------------- Wound Assessment Details Patient Name: Date of Service: [place] [name] [place] 10/23/2020 7:30 A M Medical Record Number: [id number] Patient Account Number: [id number] Date of Birth/Sex: Treating RN: 1941-06-15 (80 y.o. [name], [name] Primary Care Provider: [name]., [name] Other Clinician: Referring Provider: Treating Provider/Extender: [name], [name] in Treatment: 82 Wound Status Wound Number: 11 Primary Etiology: Venous Leg Ulcer Wound Location: Right, Proximal, Lateral Lower Leg Wound Status: Healed - Epithelialized Wounding Event: Gradually Appeared Date Acquired: 09/04/2020 Weeks Of Treatment: 7 Clustered Wound: No Photos Photo Uploaded By: Mikeal Hawthorne on 10/24/2020  14:34:04 Wound Measurements Length: (cm) Width: (cm) Depth: (cm) Area: (cm) Volume: (cm) 0 % Reduction in Area: 100% 0 % Reduction in Volume: 100% 0 0 0 Wound  Description Classification: Full Thickness Without Exposed Support Structur es Treatment Notes Wound #11 (Lower Leg) Wound Laterality: Right, Lateral, Proximal Cleanser Peri-Wound Care Topical Primary Dressing Secondary Dressing Secured With Compression Wrap Compression Stockings Add-Ons Electronic Signature(s) Signed: 10/23/2020 5:39:27 PM By: Deon Pilling Entered By: Deon Pilling on 10/23/2020 07:50:25 -------------------------------------------------------------------------------- Wound Assessment Details Patient Name: Date of Service: [place] [name] [facility] 10/23/2020 7:30 A M Medical Record Number: [id number] Patient Account Number: [id number] Date of Birth/Sex: Treating RN: [birth year] (80 y.o. [name], [name] Primary Care Provider: [name]., [name] Other Clinician: Referring Provider: Treating Provider/Extender: [name], [name] in Treatment: 11 Wound Status Wound Number: 12 Primary Etiology: Venous Leg Ulcer Wound Location: Left, Anterior Lower Leg Secondary Etiology: Lymphedema Wounding Event: Gradually Appeared Wound Status: Healed - Epithelialized Date Acquired: 09/25/2020 Weeks Of Treatment: 4 Clustered Wound: No Photos Photo Uploaded By: Mikeal Hawthorne on 10/24/2020 14:33:39 Wound Measurements Length: (cm) Width: (cm) Depth: (cm) Area: (cm) Volume: (cm) 0 % Reduction in Area: 100% 0 % Reduction in Volume: 100% 0 0 0 Wound Description Classification: Full Thickness Without Exposed Support Structur es Treatment Notes Wound #12 (Lower Leg) Wound Laterality: Left, Anterior Cleanser Peri-Wound Care Topical Primary Dressing Secondary Dressing Secured With Compression Wrap Compression Stockings Add-Ons Electronic Signature(s) Signed: 10/23/2020  5:39:27 PM By: Deon Pilling Entered By: Deon Pilling on 10/23/2020 07:50:25 -------------------------------------------------------------------------------- Wound Assessment Details Patient Name: Date of Service: [place] [name] [facility] 10/23/2020 7:30 A M Medical Record Number: [id number] Patient Account Number: [id number] Date of Birth/Sex: Treating RN: June 14, 1941 (80 y.o. [name], [name] Primary Care Provider: [name]., [name] Other Clinician: Referring Provider: Treating Provider/Extender: [name], [name] in Treatment: 14 Wound Status Wound Number: 13 Primary Etiology: Venous Leg Ulcer Wound Location: Right, Anterior Lower Leg Wound Status: Healed - Epithelialized Wounding Event: Blister Date Acquired: 10/02/2020 Weeks Of Treatment: 3 Clustered Wound: Yes Photos Photo Uploaded By: Mikeal Hawthorne on 10/24/2020 14:34:04 Wound Measurements Length: (cm) Width: (cm) Depth: (cm) Area: (cm) Volume: (cm) 0 % Reduction in Area: 100% 0 % Reduction in Volume: 100% 0 0 0 Wound Description Classification: Full Thickness Without Exposed Support Structur es Treatment Notes Wound #13 (Lower Leg) Wound Laterality: Right, Anterior Cleanser Peri-Wound Care Topical Primary Dressing Secondary Dressing Secured With Compression Wrap Compression Stockings Add-Ons Electronic Signature(s) Signed: 10/23/2020 5:39:27 PM By: Deon Pilling Entered By: Deon Pilling on 10/23/2020 07:50:25 -------------------------------------------------------------------------------- Wound Assessment Details Patient Name: Date of Service: [place] [name] [facility] 10/23/2020 7:30 A M Medical Record Number: [id number] Patient Account Number: [id number] Date of Birth/Sex: Treating RN: 03-08-1941 (80 y.o. [name], [name] Primary Care Provider: [name]., [name] Other Clinician: Referring Provider: Treating Provider/Extender: [name], [name]  in Treatment: 13 Wound Status Wound Number: 14 Primary Venous Leg Ulcer Etiology: Wound Location: Left, Lateral Lower Leg Wound Open Wounding Event: Blister Status: Date Acquired: 10/02/2020 Comorbid Anemia, Congestive Heart Failure, Hypertension, Peripheral Weeks Of Treatment: 3 History: Venous Disease, End Stage Renal Disease Clustered Wound: No Photos Photo Uploaded By: Mikeal Hawthorne on 10/24/2020 14:33:40 Wound Measurements Length: (cm) 0.8 Width: (cm) 0.6 Depth: (cm) 0.1 Area: (cm) 0.377 Volume: (cm) 0.038 % Reduction in Area: 60% % Reduction in Volume: 79.8% Epithelialization: Large (67-100%) Tunneling: No Undermining: No Wound Description Classification: Full Thickness Without Exposed Support Structures Wound Margin: Flat and Intact Exudate Amount: Medium Exudate Type: Serosanguineous Exudate Color: red,  brown Foul Odor After Cleansing: No Slough/Fibrino Yes Wound Bed Granulation Amount: Large (67-100%) Exposed Structure Granulation Quality: Pink Fascia Exposed: No Necrotic Amount: Small (1-33%) Fat Layer (Subcutaneous Tissue) Exposed: Yes Necrotic Quality: Adherent Slough Tendon Exposed: No Muscle Exposed: No Joint Exposed: No Bone Exposed: No Treatment Notes Wound #14 (Lower Leg) Wound Laterality: Left, Lateral Cleanser Soap and Water Discharge Instruction: May shower and wash wound with dial antibacterial soap and water prior to dressing change. Peri-Wound Care Sween Lotion (Moisturizing lotion) Discharge Instruction: Apply moisturizing lotion as directed Triamcinolone 15 (g) Discharge Instruction: Mixed with lotion in clinic only. Use triamcinolone 15 (g) as directed Topical Primary Dressing KerraCel Ag Gelling Fiber Dressing, 4x5 in (silver alginate) Discharge Instruction: Apply silver alginate to wound bed as instructed Secondary Dressing Woven Gauze Sponge, Non-Sterile 4x4 in Discharge Instruction: Apply over primary dressing as  directed. ABD Pad, 8x10 Discharge Instruction: Apply over primary dressing as directed. Secured With Compression Wrap FourPress (4 layer compression wrap) Discharge Instruction: ***Apply to BOTH LEGS.***Apply four layer compression as directed. Compression Stockings Add-Ons Electronic Signature(s) Signed: 10/23/2020 5:39:27 PM By: Deon Pilling Entered By: Deon Pilling on 10/23/2020 07:50:51 -------------------------------------------------------------------------------- Wound Assessment Details Patient Name: Date of Service: [name] [facility] 10/23/2020 7:30 A M Medical Record Number: [id number] Patient Account Number: [id number] Date of Birth/Sex: Treating RN: 1941-04-06 (80 y.o. [name], [name] Primary Care Provider: [name]., [name] Other Clinician: Referring Provider: Treating Provider/Extender: [name], [name] in Treatment: 50 Wound Status Wound Number: 15 Primary Infection - not elsewhere classified Etiology: Wound Location: Left T Great oe Wound Open Wounding Event: Gradually Appeared Status: Date Acquired: 10/09/2020 Comorbid Anemia, Congestive Heart Failure, Hypertension, Peripheral Weeks Of Treatment: 2 History: Venous Disease, End Stage Renal Disease Clustered Wound: No Photos Photo Uploaded By: Mikeal Hawthorne on 10/24/2020 14:33:11 Wound Measurements Length: (cm) 1 Width: (cm) 2.1 Depth: (cm) 0.1 Area: (cm) 1.649 Volume: (cm) 0.165 % Reduction in Area: -5% % Reduction in Volume: -5.1% Epithelialization: None Tunneling: No Undermining: No Wound Description Classification: Full Thickness Without Exposed Support Structures Wound Margin: Indistinct, nonvisible Exudate Amount: Medium Exudate Type: Serosanguineous Exudate Color: red, brown Foul Odor After Cleansing: No Slough/Fibrino Yes Wound Bed Granulation Amount: Small (1-33%) Exposed Structure Granulation Quality: Pink Fascia Exposed: No Necrotic Amount:  Large (67-100%) Fat Layer (Subcutaneous Tissue) Exposed: Yes Necrotic Quality: Adherent Slough Tendon Exposed: No Muscle Exposed: No Joint Exposed: No Bone Exposed: No Treatment Notes Wound #15 (Toe Great) Wound Laterality: Left Cleanser Soap and Water Discharge Instruction: May shower and wash wound with dial antibacterial soap and water prior to dressing change. Peri-Wound Care Topical Mupirocin Ointment Discharge Instruction: ***In Clinic only***. Apply Mupirocin (Bactroban) under Alignate Ag. PolySporin Discharge Instruction: patient to purchase and home health to apply under the alginate Ag, Primary Dressing KerraCel Ag Gelling Fiber Dressing, 2x2 in (silver alginate) Discharge Instruction: Apply silver alginate to wound bed as instructed Secondary Dressing Woven Gauze Sponges 2x2 in Discharge Instruction: Apply over primary dressing as directed. Secured With Conforming Stretch Gauze Bandage, Sterile 2x75 (in/in) Discharge Instruction: Secure with stretch gauze as directed. Compression Wrap Compression Stockings Add-Ons Electronic Signature(s) Signed: 10/23/2020 5:39:27 PM By: Deon Pilling Entered By: Deon Pilling on 10/23/2020 07:51:20 -------------------------------------------------------------------------------- Wound Assessment Details Patient Name: Date of Service: [place] [name] [facility] 10/23/2020 7:30 A M Medical Record Number: [id number] Patient Account Number: [id number] Date of Birth/Sex: Treating RN: 07-12-1941 (80 y.o. [name] Primary Care Provider: Carola Frost  Buel Ream., [name] Other Clinician: Referring Provider: Treating Provider/Extender: [name], [name] in Treatment: 46 Wound Status Wound Number: 4 Primary Etiology: Venous Leg Ulcer Wound Location: Right, Distal, Lateral Lower Leg Wound Status: Healed - Epithelialized Wounding Event: Blister Date Acquired: 09/11/2019 Weeks Of Treatment: 54 Clustered Wound:  No Photos Photo Uploaded By: Mikeal Hawthorne on 10/24/2020 14:34:26 Wound Measurements Length: (cm) Width: (cm) Depth: (cm) Area: (cm) Volume: (cm) 0 % Reduction in Area: 100% 0 % Reduction in Volume: 100% 0 0 0 Wound Description Classification: Full Thickness Without Exposed Support Structur es Treatment Notes Wound #4 (Lower Leg) Wound Laterality: Right, Lateral, Distal Cleanser Peri-Wound Care Topical Primary Dressing Secondary Dressing Secured With Compression Wrap Compression Stockings Add-Ons Electronic Signature(s) Signed: 10/23/2020 5:39:27 PM By: Deon Pilling Entered By: Deon Pilling on 10/23/2020 07:50:26 -------------------------------------------------------------------------------- Vitals Details Patient Name: Date of Service: [place] 10/23/2020 7:30 A M Medical Record Number: [id number] Patient Account Number: [id number] Date of Birth/Sex: Treating RN: Jan 07, 1941 (80 y.o. [name], [name] Primary Care Provider: [name]., [name] Other Clinician: Referring Provider: Treating Provider/Extender: [name], [name] in Treatment: 54 Vital Signs Time Taken: 07:39 Temperature (F): 97.7 Height (in): 71 Pulse (bpm): 92 Weight (lbs): 198 Respiratory Rate (breaths/min): 22 Body Mass Index (BMI): 27.6 Blood Pressure (mmHg): 107/68 Reference Range: 80 - 120 mg / dl Electronic Signature(s) Signed: 10/23/2020 10:36:55 AM By: Sandre Kitty Entered By: Sandre Kitty on 10/23/2020 07:40:14

## 2020-10-30 ENCOUNTER — Encounter (HOSPITAL_BASED_OUTPATIENT_CLINIC_OR_DEPARTMENT_OTHER): Payer: Medicare Other | Attending: Internal Medicine | Admitting: Physician Assistant

## 2020-10-30 ENCOUNTER — Other Ambulatory Visit: Payer: Self-pay

## 2020-10-30 DIAGNOSIS — N186 End stage renal disease: Secondary | ICD-10-CM | POA: Diagnosis not present

## 2020-10-30 DIAGNOSIS — I87323 Chronic venous hypertension (idiopathic) with inflammation of bilateral lower extremity: Secondary | ICD-10-CM | POA: Diagnosis not present

## 2020-10-30 DIAGNOSIS — L97812 Non-pressure chronic ulcer of other part of right lower leg with fat layer exposed: Secondary | ICD-10-CM | POA: Insufficient documentation

## 2020-10-30 DIAGNOSIS — L97822 Non-pressure chronic ulcer of other part of left lower leg with fat layer exposed: Secondary | ICD-10-CM | POA: Diagnosis not present

## 2020-10-30 DIAGNOSIS — I890 Lymphedema, not elsewhere classified: Secondary | ICD-10-CM | POA: Diagnosis present

## 2020-10-30 DIAGNOSIS — E1122 Type 2 diabetes mellitus with diabetic chronic kidney disease: Secondary | ICD-10-CM | POA: Insufficient documentation

## 2020-10-30 DIAGNOSIS — S90812D Abrasion, left foot, subsequent encounter: Secondary | ICD-10-CM | POA: Diagnosis not present

## 2020-10-30 DIAGNOSIS — E1151 Type 2 diabetes mellitus with diabetic peripheral angiopathy without gangrene: Secondary | ICD-10-CM | POA: Insufficient documentation

## 2020-10-30 DIAGNOSIS — X58XXXD Exposure to other specified factors, subsequent encounter: Secondary | ICD-10-CM | POA: Insufficient documentation

## 2020-10-30 DIAGNOSIS — I5022 Chronic systolic (congestive) heart failure: Secondary | ICD-10-CM | POA: Diagnosis not present

## 2020-10-30 DIAGNOSIS — E11621 Type 2 diabetes mellitus with foot ulcer: Secondary | ICD-10-CM | POA: Insufficient documentation

## 2020-10-30 NOTE — Progress Notes (Addendum)
[name], [name] ([id number]) Visit Report for 10/30/2020 Chief Complaint Document Details Patient Name: Date of Service: [name], [name] 10/30/2020 12:30 PM Medical Record Number: [id number] Patient Account Number: [id number] Date of Birth/Sex: Treating RN: 1941-02-22 (80 y.o. [name] Primary Care Provider: [name]., [name] Other Clinician: Referring Provider: Treating Provider/Extender: Troy Morgan., [name] Weeks in Treatment: 84 Information Obtained from: Patient Chief Complaint 10/11/2019; patient is here for review of wounds on his bilateral lower extremities and left foot Electronic Signature(s) Signed: 10/30/2020 1:02:13 PM By: Worthy Keeler PA-C Entered By: Worthy Keeler on 10/30/2020 13:02:13 -------------------------------------------------------------------------------- HPI Details Patient Name: Date of Service: Troy [name], [name] 10/30/2020 12:30 PM Medical Record Number: [id number] Patient Account Number: [id number] Date of Birth/Sex: Treating RN: 1941-01-04 (80 y.o. [name], [name] Primary Care Provider: [name]., [name] Other Clinician: Referring Provider: Treating Provider/Extender: Troy Morgan., [name] Weeks in Treatment: 93 History of Present Illness HPI Description: ADMISSION 10/11/2019 This is a 80 year old man with a history of a severe cardiomyopathy with an ejection fraction of about 20%, chronic renal failure stage III. He is listed as a type II diabetic in epic although the patient denies this. He also has a history of PVD. He states for the last month he has had wounds on his bilateral lower extremities that started off as blisters which denuded. He has areas on the left lateral calf and 2 on the right lateral. He has an area on the left first met head which he did not know was there he we identified this on intake. He has been using Silvadene cream provided by his primary care physician but he  is complaining that this burns. 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 He was acutely ill at that time complicating GI bleed. ABIs in our clinic were 1.16 on the right and 1.13 on the left 2/25; the patient comes in with his areas on the left lateral and right lateral calf. There is also an area over the left first MTP bunion deformity. We have been using Sorbact. His edema control is fairly good 3/4; left lateral and right lateral calf. Most of his wounds are in the same position tightly adherent nonviable debris. On the right we debrided the superior wound on the left both wounds. The area on his bunion over the left first MTP medially is I think just about closed. We have been using Sorbact without a lot of success changed to Iodoflex under compression 3/11; left lateral and right lateral calf perhaps minor improvement in the surface condition. We have been using Iodoflex. The area over the bunion of the left first MTP has closed over 3/18; left lateral and right lateral calf not much improvement. We have been using Iodoflex. Aggressive debridement last week. 3/25; left lateral and right lateral calf. We have been using Iodoflex. There is some improvement in the superior area on the right and 2 on the lateral left although there is still a lot of debris on the surface. The inferior area on the right is still a completely nonviable surface. 4/8;  left lateral and right lateral calfs. Essentially mirror-image looking wounds 2 wounds on each side in close juxtaposition we have been using Iodoflex with some improvement in the very adherent  fibrinous debris but not a lot. The patient has arterial studies next Wednesday morning and venous studies next Thursday morning. I have been avoiding any further aggressive debridement until we see the arterial study results. His ABIs were fairly good in this clinic and is dorsalis pedis pulses are palpable but the wound beds are pale. 4/15; left lateral and right lateral calfs. Essentially mirror-image wounds with 2 wounds on each side in close juxtaposition but with rims of separating normal tissue. We have been using Iodoflex. The base of the wounds has been cleaning up quite nicely ARTERIAL STUDIES were done showing the patient had an ABI on the right of 1.27 with triphasic waveforms and a TBI of 0.90 on the left triphasic waveforms with an ABI of 1.28 and a TBI of 0.87. No evidence of arterial disease VENOUS REFLUX STUDIES; were done yesterday we do not have this report yet. 4/23; VENOUS REFLUX STUDIES did not show any significant reflux right lower extremity. No evidence of a DVT He did have significant reflux in the common . femoral vein on the left but nothing else was listed as significant. He did not have a DVT. We have been using Iodoflex under compression his wounds are making progress. 4/29; improvements in the wound surface continue. We have been using Iodoflex. He has a 20% out-of-pocket co-pay for Apligraf which is unfortunate. We changed him to Sorbact today 5/6; started on Sorbact last week. Better looking wound surface but not much change in dimensions. 01/24/20-Patient is back at 3 weeks, wound surfaces are about the same but very minimal slough on the right leg wounds, however there is blistering on both legs adjacent to the wounds, patient denies any other symptoms or new symptoms. His studies have been reviewed and do not indicate any significant arterial or venous disease. But he is attending once in 3 weeks with home health changing in between 6/3; patient has 4 wounds 2  on each side of his lateral lower legs. These wounds are somewhat improved. He apparently arrived in clinic last week with a large blister on the right lateral lower leg that had denuded into the wound. They changed to silver alginate. Still under compression. He has straight Medicare and unfortunately has an unlimited co-pay for advanced treatment options. 6/10; his original 4 wounds are all better especially on the left lateral lower leg. Areas on the right medial have a better looking surface were using silver collagen The blister on the right lateral lower leg from last week has an open area however this looks fairly healthy were using silver alginate on this He comes in today having "stubbed" his left second toe he has an abrasion at the base of the nailbed I think he probably caught the nail which is mycotic. 6/24; blister on the right lateral lower leg has healed. There is no additional wounds. The traumatic left second toe is also closed. The 4 that he has are all better 7/1; all of the patient's wounds are smaller except for the proximal one on the left lateral calf. We are using silver collagen 7/22; patient has home health. Relatively refractory wounds on the lateral part of both mid calfs. Each of them 2 wounds. The areas on the left are doing much better in fact the distal 1 looks like it is on  his way to closure. The right required debridement with adherent fibrinous debris under illumination. We have been using silver collagen. Previous arterial studies were within normal limits he also had venous reflux studies 04/03/2020 upon evaluation today patient appears to be doing excellent with regard to his lower extremity ulcers. He is going to require a little bit of debridement on the wounds on the right especially in order to clear away some biofilm and slough but this is minimal and overall he seems to be doing quite well. 8/19; the patient's wounds on the left lateral lower leg just above  closed. However the 2 on the lateral part of the right have a nonviable necrotic surface. This is disappointing. There is also no change in dimensions. He has had arterial studies as well as venous reflux studies 8/26; very disappointing today. Even the more superficial areas on the left are about the same as last week. Still 2 punched-out areas completely unchanged on the right. We have been using silver collagen really making no progress. 9/2; changed to Iodoflex last week better looking wound surfaces especially on the right although they are deep and punched out. The areas on the left are more superficial open 1 of these is almost fully epithelialized although it has been this way for at least 2 weeks. He has a 20% co-pay [Medicare only] unaffordable for a skin substitute. Had some thought about a snap VAC on the deep areas on the right leg we will try to put this through in the insurance 9/9; punched-out wounds on the bilateral lateral lower extremities. We have been operating as if the these were chronic venous wounds with secondary lymphedema. The areas on the left have been doing well in fact one of them is closed over. We have had no improvement in the areas on the right we have been using Iodoflex to help with surface debridement. The areas on the right are deeper wider. I do not see any evidence of infection. Home health is not been putting the wraps on high enough he has localized lymphedema right above the wounds. He has had arterial and venous studies already 9/16; punched-out mirror-image wounds on his bilateral lateral mid calfs. We have 1 closed on the left lateral the other smaller. For the first time today the areas on the right lateral actually looks some better. I did a biopsy of 1 of these last time although we still do not have this result. HOWEVER he arrives in clinic today complaining of chest pain overnight which felt like a brick on his chest. He also had 2 black watery bowel  movements. He does not have nausea or vomiting. He is not describing abdominal pain. He has no prior history of diarrhea heartburn etc. 9/30; sent this patient to the ER on 9/16. 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 He is on twice daily PPIs Protonix. His admission hemoglobin was exceptionally low at 5. 8.. Discharged at 8.1. 4 unit transfusion. 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 His diuretic dose was actually decreased because of elevated creatinine currently at torsemide 20 mg not felt to be a Coumadin candidate. Patient states he feels well. The areas on his left leg are healed. The areas on the right look better. These have been very refractory wounds. He does not have stockings we will make arrangements for this today 10/7; the areas on the  left leg are healed we will transition him into his 20/30 stocking today. Continued improvement in the areas on the right lateral leg 10/21; the areas on the left leg remain healed at this 2-week follow-up however there is a history that he developed some drainage from one of the wound sites with home health therefore going ahead and wrapping him. He was supposed to be on 20/30 stockings although the history here is vague and I am not sure that this represents a stocking failure or not. We have been wrapping his right leg with the one remaining wound is the distal wound. We have been using silver collagen 10/28 the patient only has 1 wound remaining on the right lower calf. This still has 3 mm of depth which is unchanged but the surface area is smaller. The superior wound on the right lateral is closed. The 2 wounds on the left remain closed and he is using his compression stocking 11/4 1 remaining wound on the right lower calf. Under illumination not a viable surface we have been using silver collagen we had good effect on the other areas but  this 1 appears to be stalled. There is no open area on the left leg he is using his own stocking. 11/11; 1 remaining wound on the right lower calf. Surface of this is a lot better than last week but still requiring debridement I am using Iodoflex but looking forward to changing the primary dressing to perhaps endoform 11/18; 1 remaining wound on the right lower leg, venous insufficiency, vigorous debridement last week/Iodoflex. Depth of the wound is therefore larger but the wound looks clean. Still very gritty material at the surface requiring debridement 12/2; the remaining wound on his right lower leg, venous insufficiency. I changed him to endoform 2 weeks ago. We appear to be making nice progress. Wound is measuring smaller 12/16; he comes in today with one of his two wounds on the left opened infected second area on the left may be threatened as well as the healed one on the right. He has edema in both legs which I think is pitting. He says his weights are stable he has unknown fairly severe cardiomyopathy. His cardiologist is Dr. Brigitte Pulse it does not sound like he is really been carefully followed 12/23; the areas that I was concerned about on the left look a lot better we put some silver alginate and put him back in compression. It may be the compression that actually does the trick here. The remaining open area we have been using Iodoflex and again this looks a lot better. At our suggestion he went to see his primary who did not adjust his diuretic he is still apparently taking 20 mg of a loop diuretic and "sometimes" 40 mg is basically what the patient stated 1/6; the area on the left is closed once again. We will transition him into his 20/30 stocking from elastic therapy both areas on the right are now open which is a deterioration from last time at which time the only 1 was open but they are very superficial. We have been using Hydrofera Blue under compression 1/27; its been 3 weeks since the  patient was here. We transitioned him to a 20/30 stocking on the left leg. Still under 4-layer compression on the right. He comes in today with a new small opening on the left leg but reopening on the right. Massive blistering on the right leg. He has severe systemic fluid volume overload. I have looked  over the patient's records. He was seen in 2020 by tele health by cardiology. 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 I think he was supposed to have follow-up but that follow-up never seems to have happened. He does not complain of chest pain or shortness of breath but notes increasing edema not just in his lower legs 2/3; he has new open areas on the bilateral anterior lower legs. I think a lot of this is because of systemic fluid volume overload. The ultrasound I ordered showed ascites and suggestion of cirrhosis. His physical exam continues to suggest right greater than left congestive heart failure. I have him taking 40 mg of Lasix oral daily I have asked him to start weighing himself daily. With the intense efforts of our nursing staff we have him a cardiology appointment on Monday or Tuesday of next week I have told him not to miss this. With regards to his wounds I do not think we are going to be able to stop wrapping him until somebody manages to get his fluid volume down 2/10; the areas on his bilateral lateral lower legs actually look better today. He has been taking Lasix 40 mg a day I believe [2 x 20 mg]. He seems to have less edema in his legs and and is a result of this I think he has less edema in his lower legs as well and the wounds appear to be doing better. He comes in today with purulence coming out of the toenail bed of the left great toe it appears that his toenail came off there was noted to be pus I did a culture of this area 2/24; since the patient was last here 2 weeks ago he was admitted to hospital from 2/12 through  2/16 predominantly acute on chronic systolic heart failure. He also has stage IV chronic kidney disease discharge creatinine at 2.71 and on top of this morphology of the liver on ultrasound suggested cirrhosis of unclear etiology. He had 9.4 L of ascites tapped I am unclear whether this was sent for analysis at the time of this dictation. He is currently taking 40 mg of Demadex a day I am a bit surprised that was so low. Nevertheless the wounds on his legs are a lot better everything is closed on the right he still has an open area on the left and then the area on the great toe nail matrix on the left from last time. 10/30/2020 upon evaluation today patient appears to be doing well with regard to his legs other than the fact that they are extremely swollen. He has multiple blisters noted bilateral lower extremities. Apparently the home health nurse came out yesterday unwrapped them and noticed a couple blisters she called 911 and would not rewrap him. There is no evidence of infection whatsoever we did not receive a phone call that we are aware of anyway regarding the fact that the home health nurse had any concerns at all. Nonetheless the major issue here is that the patient is going to really need to make sure that his fluids under good control but I do not see anything that indicates he needs to go to the hospital right now. There is literally no evidence of infection although fluid is just completely clear. Electronic Signature(s) Signed: 10/30/2020 1:38:19 PM By: Worthy Keeler PA-C Entered By: Worthy Keeler on 10/30/2020 13:38:19 -------------------------------------------------------------------------------- Physical Exam Details Patient Name: Date of Service: Troy RRA  [name] [place] 10/30/2020 12:30 PM Medical Record Number: [id number] Patient Account Number: [id number] Date of Birth/Sex: Treating RN: 12/18/1940 (80 y.o. [name], [name] Primary Care Provider: [name]., [name] Other  Clinician: Referring Provider: Treating Provider/Extender: Troy Morgan., [name] Weeks in Treatment: 73 Constitutional Well-nourished and well-hydrated in no acute distress. Respiratory normal breathing without difficulty. Cardiovascular 1+ pitting edema of the bilateral lower extremities. Psychiatric this patient is able to make decisions and demonstrates good insight into disease process. Alert and Oriented x 3. pleasant and cooperative. Notes Upon inspection patient's wound bed actually again showed signs of pretty good granulation and the 2 open wound locations. The multiple blisters over the bilateral lower extremities I am hopeful will just reabsorb. Some of them have already ruptured and seem to be reattaching/reabsorbing. Nonetheless I think that is probably the best way to go obviously he does have significant edema but again he has a fluid issue overall in general. Electronic Signature(s) Signed: 10/30/2020 1:38:44 PM By: Worthy Keeler PA-C Entered By: Worthy Keeler on 10/30/2020 13:38:44 -------------------------------------------------------------------------------- Physician Orders Details Patient Name: Date of Service: [place] 10/30/2020 12:30 PM Medical Record Number: [id number] Patient Account Number: [id number] Date of Birth/Sex: Treating RN: 1941/06/18 (80 y.o. [name], [name] Primary Care Provider: [name]., [name] Other Clinician: Referring Provider: Treating Provider/Extender: Troy Morgan., [name] Weeks in Treatment: 14 Verbal / Phone Orders: No Diagnosis Coding ICD-10 Coding Code Description [phone] Chronic venous hypertension (idiopathic) with inflammation of bilateral lower extremity I89.0 Lymphedema, not elsewhere classified L97.822 Non-pressure chronic ulcer of other part of left lower leg with fat layer exposed L97.812 Non-pressure chronic ulcer of other part of right lower leg with fat layer  exposed X52.84 Chronic systolic (congestive) heart failure S90.812D Abrasion, left foot, subsequent encounter Follow-up Appointments ppointment in 2 weeks. - Thursday Return A Nurse Visit: - Thursday 11/07/2018. Bathing/ Shower/ Hygiene May shower with protection but do not get wound dressing(s) wet. May shower and wash wound with soap and water. - with dressing changes only. Edema Control - Lymphedema / SCD / Other Elevate legs to the level of the heart or above for 30 minutes daily and/or when sitting, a frequency of: - 3-4 times a day throughout the day. Avoid standing for long periods of time. Exercise regularly Non Wound Condition Right Lower Extremity Other Non Wound Condition Orders/Instructions: - 4 layer compression to right leg apply lotion. wound center weekly and home health once a week. zetuvit or extrasorb padding over blister areas for absorption. Home Health New wound care orders this week; continue Home Health for wound care. May utilize formulary equivalent dressing for wound treatment orders unless otherwise specified. - [facility] home health. Wound Treatment Wound #14 - Lower Leg Wound Laterality: Left, Lateral Cleanser: Soap and Water (Home Health) 2 x Per Week/30 Days Discharge Instructions: May shower and wash wound with dial antibacterial soap and water prior to dressing change. Peri-Wound Care: Sween Lotion (Moisturizing lotion) (Home Health) 2 x Per Week/30 Days Discharge Instructions: Apply moisturizing lotion as directed Peri-Wound Care: Triamcinolone 15 (g) 2 x Per Week/30 Days Discharge Instructions: Mixed with lotion in clinic only. Use triamcinolone 15 (g) as directed Prim Dressing: KerraCel Ag Gelling Fiber Dressing, 4x5 in (silver alginate) (Home Health) 2 x Per Week/30 Days ary Discharge Instructions: Apply silver alginate to wound bed as instructed Secondary Dressing: Woven Gauze Sponge, Non-Sterile 4x4 in [facility])  2 x Per Week/30  Days Discharge Instructions: Apply over primary dressing as directed. Secondary Dressing: ABD Pad, 8x10 (Home Health) 2 x Per Week/30 Days Discharge Instructions: Apply over primary dressing as directed. Compression Wrap: FourPress (4 layer compression wrap) (Home Health) 2 x Per Week/30 Days Discharge Instructions: ***Apply to BOTH LEGS.***Apply four layer compression as directed. Wound #15 - T Great oe Wound Laterality: Left Cleanser: Soap and Water (Home Health) 2 x Per Day/15 Days Discharge Instructions: May shower and wash wound with dial antibacterial soap and water prior to dressing change. Topical: Mupirocin Ointment 2 x Per Day/15 Days Discharge Instructions: ***In Clinic only***. Apply Mupirocin (Bactroban) under Alignate Ag. Topical: PolySporin (Home Health) 2 x Per Day/15 Days Discharge Instructions: patient to purchase and home health to apply under the alginate Ag, Prim Dressing: KerraCel Ag Gelling Fiber Dressing, 2x2 in (silver alginate) ([place]) 2 x Per Day/15 Days ary Discharge Instructions: Apply silver alginate to wound bed as instructed Secondary Dressing: Woven Gauze Sponges 2x2 in ([place]) 2 x Per Day/15 Days Discharge Instructions: Apply over primary dressing as directed. Secured With: Child psychotherapist, Sterile 2x75 (in/in) (Home Health) 2 x Per Day/15 Days Discharge Instructions: Secure with stretch gauze as directed. Electronic Signature(s) Signed: 10/30/2020 5:31:07 PM By: Worthy Keeler PA-C Signed: 10/30/2020 5:49:32 PM By: Deon Pilling Entered By: Deon Pilling on 10/30/2020 13:35:06 -------------------------------------------------------------------------------- Problem List Details Patient Name: Date of Service: [place] 10/30/2020 12:30 PM Medical Record Number: [id number] Patient Account Number: [id number] Date of Birth/Sex: Treating RN: Jan 01, 1941 (80 y.o. [name], [name] Primary Care Provider: [name]., Troy  Patton Other Clinician: Referring Provider: Treating Provider/Extender: Troy Morgan., [name] Weeks in Treatment: 70 Active Problems ICD-10 Encounter Code Description Active Date MDM Diagnosis I87.323 Chronic venous hypertension (idiopathic) with inflammation of bilateral lower 10/11/2019 No Yes extremity I89.0 Lymphedema, not elsewhere classified 10/11/2019 No Yes L97.822 Non-pressure chronic ulcer of other part of left lower leg with fat layer exposed2/06/2020 No Yes L97.812 Non-pressure chronic ulcer of other part of right lower leg with fat layer 01/03/2020 No Yes exposed M35.36 Chronic systolic (congestive) heart failure 10/02/2020 No Yes S90.812D Abrasion, left foot, subsequent encounter 10/09/2020 No Yes Inactive Problems ICD-10 Code Description Active Date Inactive Date L97.521 Non-pressure chronic ulcer of other part of left foot limited to breakdown of skin 10/11/2019 10/11/2019 S90.812D Abrasion, left foot, subsequent encounter 02/07/2020 02/07/2020 I42.9 Cardiomyopathy, unspecified 05/15/2020 05/15/2020 K92.1 Melena 05/15/2020 05/15/2020 Resolved Problems ICD-10 Code Description Active Date Resolved Date 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 [name] Primary Care Provider: [name]., [name] Other Clinician: Referring Provider: Treating Provider/Extender: Troy Morgan., [name] Weeks in Treatment: 27 Subjective Chief Complaint Information obtained from  Patient 10/11/2019; patient is here for review of wounds on his bilateral lower extremities and left foot History of Present Illness (HPI) ADMISSION 10/11/2019 This is a 80 year old man with a history of a severe cardiomyopathy with an ejection fraction of about 20%, chronic renal failure stage III. He is listed as a type II diabetic in epic although  the patient denies this. He also has a history of PVD. He states for the last month he has had wounds on his bilateral lower extremities that started off as blisters which denuded. He has areas on the left lateral calf and 2 on the right lateral. He has an area on the left first met head which he did not know was there he we identified this on intake. He has been using Silvadene cream provided by his primary care physician but he is complaining that this burns. 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 He was acutely ill at that time complicating GI bleed. ABIs in our clinic were 1.16 on the right and 1.13 on the left 2/25; the patient comes in with his areas on the left lateral and right lateral calf. There is also an area over the left first MTP bunion deformity. We have been using Sorbact. His edema control is fairly good 3/4; left lateral and right lateral calf. Most of his wounds are in the same position tightly adherent nonviable debris. On the right we debrided the superior wound on the left both wounds. The area on his bunion over the left first MTP medially is I think just about closed. We have been using Sorbact without a lot of success changed to Iodoflex under compression 3/11; left  lateral and right lateral calf perhaps minor improvement in the surface condition. We have been using Iodoflex. The area over the bunion of the left first MTP has closed over 3/18; left lateral and right lateral calf not much improvement. We have been using Iodoflex. Aggressive debridement last week. 3/25; left lateral and right lateral calf. We have been using Iodoflex. There is some improvement in the superior area on the right and 2 on the lateral left although there is still a lot of debris on the surface. The inferior area on the right is still a completely nonviable surface. 4/8; left lateral and right lateral calfs. Essentially mirror-image looking wounds 2 wounds on each side in close juxtaposition we have been using Iodoflex with some improvement in the very adherent fibrinous debris but not a lot. The patient has arterial studies next Wednesday morning and venous studies next Thursday morning. I have been avoiding any further aggressive debridement until we see the arterial study results. His ABIs were fairly good in this clinic and is dorsalis pedis pulses are palpable but the wound beds are pale. 4/15; left lateral and right lateral calfs. Essentially mirror-image wounds with 2 wounds on each side in close juxtaposition but with rims of separating normal tissue. We have been using Iodoflex. The base of the wounds has been cleaning up quite nicely ARTERIAL STUDIES were done showing the patient had an ABI on the right of 1.27 with triphasic waveforms and a TBI of 0.90 on the left triphasic waveforms with an ABI of 1.28 and a TBI of 0.87. No evidence of arterial disease VENOUS REFLUX STUDIES; were done yesterday we do not have this report yet. 4/23; VENOUS REFLUX STUDIES did not show any significant reflux right lower extremity. No evidence of a DVT He did have significant reflux in the common . femoral vein on the left  but nothing else was listed as significant. He did not have a DVT. We  have been using Iodoflex under compression his wounds are making progress. 4/29; improvements in the wound surface continue. We have been using Iodoflex. He has a 20% out-of-pocket co-pay for Apligraf which is unfortunate. We changed him to Sorbact today 5/6; started on Sorbact last week. Better looking wound surface but not much change in dimensions. 01/24/20-Patient is back at 3 weeks, wound surfaces are about the same but very minimal slough on the right leg wounds, however there is blistering on both legs adjacent to the wounds, patient denies any other symptoms or new symptoms. His studies have been reviewed and do not indicate any significant arterial or venous disease. But he is attending once in 3 weeks with home health changing in between 6/3; patient has 4 wounds 2 on each side of his lateral lower legs. These wounds are somewhat improved. He apparently arrived in clinic last week with a large blister on the right lateral lower leg that had denuded into the wound. They changed to silver alginate. Still under compression. He has straight Medicare and unfortunately has an unlimited co-pay for advanced treatment options. 6/10; his original 4 wounds are all better especially on the left lateral lower leg. 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 6/24; blister on the right lateral lower leg has healed. There is no additional wounds. The traumatic left second toe is also closed. The 4 that he has are all better 7/1; all of the patient's wounds are smaller except for the proximal one on the left lateral calf. We are using silver collagen 7/22; patient has  home health. Relatively refractory wounds on the lateral part of both mid calfs. Each of them 2 wounds. The areas on the left are doing much better in fact the distal 1 looks like it is on his way to closure. The right required debridement with adherent fibrinous debris under illumination. We have been using silver collagen. Previous arterial studies were within normal limits he also had venous reflux studies 04/03/2020 upon evaluation today patient appears to be doing excellent with regard to his lower extremity ulcers. He is going to require a little bit of debridement on the wounds on the right especially in order to clear away some biofilm and slough but this is minimal and overall he seems to be doing quite well. 8/19; the patient's wounds on the left lateral lower leg just above closed. However the 2 on the lateral part of the right have a nonviable necrotic surface. This is disappointing. There is also no change in dimensions. He has had arterial studies as well as venous reflux studies 8/26; very disappointing today. Even the more superficial areas on the left are about the same as last week. Still 2 punched-out areas completely unchanged on the right. We have been using silver collagen really making no progress. 9/2; changed to Iodoflex last week better looking wound surfaces especially on the right although they are deep and punched out. The areas on the left are more superficial open 1 of these is almost fully epithelialized although it has  been this way for at least 2 weeks. He has a 20% co-pay [Medicare only] unaffordable for a skin substitute. Had some thought about a snap VAC on the deep areas on the right leg we will try to put this through in the insurance 9/9; punched-out wounds on the bilateral lateral lower extremities. We have been operating as if the these were chronic venous wounds with secondary lymphedema. The areas on the left have been doing well in fact one of them is closed  over. We have had no improvement in the areas on the right we have been using Iodoflex to help with surface debridement. The areas on the right are deeper wider. I do not see any evidence of infection. Home health is not been putting the wraps on high enough he has localized lymphedema right above the wounds. He has had arterial and venous studies already 9/16; punched-out mirror-image wounds on his bilateral lateral mid calfs. We have 1 closed on the left lateral the other smaller. For the first time today the areas on the right lateral actually looks some better. I did a biopsy of 1 of these last time although we still do not have this result. HOWEVER he arrives in clinic today complaining of chest pain overnight which felt like a brick on his chest. He also had 2 black watery bowel movements. He does not have nausea or vomiting. He is not describing abdominal pain. He has no prior history of diarrhea heartburn etc. 9/30; sent this patient to the ER on 9/16. 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 He is on twice daily PPIs Protonix. His admission hemoglobin was exceptionally low at 5. 8.. Discharged at 8.1. 4 unit transfusion. 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 His diuretic dose was actually decreased because of elevated creatinine currently at torsemide 20 mg not felt to be a Coumadin candidate. Patient states he feels well. The areas on his left leg are healed. The areas on the right look better. These have been very refractory wounds. He does not have stockings we will make arrangements for this today 10/7; the areas on the left leg are healed we will transition him into his 20/30 stocking today. Continued improvement in the areas on the right lateral leg 10/21; the areas on the left leg remain healed at this 2-week follow-up however there is a history that he developed some drainage  from one of the wound sites with home health therefore going ahead and wrapping him. He was supposed to be on 20/30 stockings although the history here is vague and I am not sure that this represents a stocking failure or not. We have been wrapping his right leg with the one remaining wound is the distal wound. We have been using silver collagen 10/28 the patient only has 1 wound remaining on the right lower calf. This still has 3 mm of depth which is unchanged but the surface area is smaller. The superior wound on the right lateral is closed. The 2 wounds on the left remain closed and he is using his compression stocking 11/4 1 remaining wound on the right lower calf. Under illumination not a viable surface we have been using silver collagen we had good effect on the other areas but this 1 appears to be stalled. There is no open area on the left leg he is using his own stocking. 11/11; 1  remaining wound on the right lower calf. Surface of this is a lot better than last week but still requiring debridement I am using Iodoflex but looking forward to changing the primary dressing to perhaps endoform 11/18; 1 remaining wound on the right lower leg, venous insufficiency, vigorous debridement last week/Iodoflex. Depth of the wound is therefore larger but the wound looks clean. Still very gritty material at the surface requiring debridement 12/2; the remaining wound on his right lower leg, venous insufficiency. I changed him to endoform 2 weeks ago. We appear to be making nice progress. Wound is measuring smaller 12/16; he comes in today with one of his two wounds on the left opened infected second area on the left may be threatened as well as the healed one on the right. He has edema in both legs which I think is pitting. He says his weights are stable he has unknown fairly severe cardiomyopathy. His cardiologist is Dr. Brigitte Pulse it does not sound like he is really been carefully followed 12/23; the areas  that I was concerned about on the left look a lot better we put some silver alginate and put him back in compression. It may be the compression that actually does the trick here. The remaining open area we have been using Iodoflex and again this looks a lot better. At our suggestion he went to see his primary who did not adjust his diuretic he is still apparently taking 20 mg of a loop diuretic and "sometimes" 40 mg is basically what the patient stated 1/6; the area on the left is closed once again. We will transition him into his 20/30 stocking from elastic therapy both areas on the right are now open which is a deterioration from last time at which time the only 1 was open but they are very superficial. We have been using Hydrofera Blue under compression 1/27; its been 3 weeks since the patient was here. We transitioned him to a 20/30 stocking on the left leg. Still under 4-layer compression on the right. He comes in today with a new small opening on the left leg but reopening on the right. Massive blistering on the right leg. He has severe systemic fluid volume overload. I have looked over the patient's records. He was seen in 2020 by tele health by cardiology. 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 I think he was supposed to have follow-up but that follow-up never seems to have happened. He does not complain of chest pain or shortness of breath but notes increasing edema not just in his lower legs 2/3; he has new open areas on the bilateral anterior lower legs. I think a lot of this is because of systemic fluid volume overload. The ultrasound I ordered showed ascites and suggestion of cirrhosis. His physical exam continues to suggest right greater than left congestive heart failure. I have him taking 40 mg of Lasix oral daily I have asked him to start weighing himself daily. With the intense efforts of our nursing staff we have him a  cardiology appointment on Monday or Tuesday of next week I have told him not to miss this. With regards to his wounds I do not think we are going to be able to stop wrapping him until somebody manages to get his fluid volume down 2/10; the areas on his bilateral lateral lower legs actually look better today. He has been taking Lasix 40 mg a day I believe [2  x 20 mg]. He seems to have less edema in his legs and and is a result of this I think he has less edema in his lower legs as well and the wounds appear to be doing better. He comes in today with purulence coming out of the toenail bed of the left great toe it appears that his toenail came off there was noted to be pus I did a culture of this area 2/24; since the patient was last here 2 weeks ago he was admitted to hospital from 2/12 through 2/16 predominantly acute on chronic systolic heart failure. He also has stage IV chronic kidney disease discharge creatinine at 2.71 and on top of this morphology of the liver on ultrasound suggested cirrhosis of unclear etiology. He had 9.4 L of ascites tapped I am unclear whether this was sent for analysis at the time of this dictation. He is currently taking 40 mg of Demadex a day I am a bit surprised that was so low. Nevertheless the wounds on his legs are a lot better everything is closed on the right he still has an open area on the left and then the area on the great toe nail matrix on the left from last time. 10/30/2020 upon evaluation today patient appears to be doing well with regard to his legs other than the fact that they are extremely swollen. He has multiple blisters noted bilateral lower extremities. Apparently the home health nurse came out yesterday unwrapped them and noticed a couple blisters she called 911 and would not rewrap him. There is no evidence of infection whatsoever we did not receive a phone call that we are aware of anyway regarding the fact that the home health nurse had any  concerns at all. Nonetheless the major issue here is that the patient is going to really need to make sure that his fluids under good control but I do not see anything that indicates he needs to go to the hospital right now. There is literally no evidence of infection although fluid is just completely clear. Objective Constitutional Well-nourished and well-hydrated in no acute distress. Vitals Time Taken: 12:49 PM, Height: 71 in, Weight: 198 lbs, BMI: 27.6, Temperature: 97.8 F, Pulse: 90 bpm, Respiratory Rate: 22 breaths/min, Blood Pressure: 105/71 mmHg. Respiratory normal breathing without difficulty. Cardiovascular 1+ pitting edema of the bilateral lower extremities. Psychiatric this patient is able to make decisions and demonstrates good insight into disease process. Alert and Oriented x 3. pleasant and cooperative. General Notes: Upon inspection patient's wound bed actually again showed signs of pretty good granulation and the 2 open wound locations. The multiple blisters over the bilateral lower extremities I am hopeful will just reabsorb. Some of them have already ruptured and seem to be reattaching/reabsorbing. Nonetheless I think that is probably the best way to go obviously he does have significant edema but again he has a fluid issue overall in general. Integumentary (Hair, Skin) Wound #14 status is Open. Original cause of wound was Blister. The date acquired was: 10/02/2020. The wound has been in treatment 4 weeks. The wound is located on the Left,Lateral Lower Leg. The wound measures 0.8cm length x 0.7cm width x 0.1cm depth; 0.44cm^2 area and 0.044cm^3 volume. There is Fat Layer (Subcutaneous Tissue) exposed. There is a medium amount of serosanguineous drainage noted. The wound margin is distinct with the outline attached to the wound base. There is large (67-100%) pink granulation within the wound bed. There is a small (1-33%) amount of necrotic tissue  within the wound  bed including Adherent Slough. Wound #15 status is Open. Original cause of wound was Gradually Appeared. The date acquired was: 10/09/2020. The wound has been in treatment 3 weeks. The wound is located on the Left T Great. The wound measures 0.8cm length x 1.5cm width x 0.1cm depth; 0.942cm^2 area and 0.094cm^3 volume. There is oe Fat Layer (Subcutaneous Tissue) exposed. There is no tunneling or undermining noted. There is a medium amount of serosanguineous drainage noted. The wound margin is distinct with the outline attached to the wound base. There is large (67-100%) pink granulation within the wound bed. There is no necrotic tissue within the wound bed. Other Condition(s) Patient presents with Lymphedema located on the Right Leg. General Notes: 5 new blisters noted on right lower leg Assessment Active Problems ICD-10 Chronic venous hypertension (idiopathic) with inflammation of bilateral lower extremity Lymphedema, not elsewhere classified Non-pressure chronic ulcer of other part of left lower leg with fat layer exposed Non-pressure chronic ulcer of other part of right lower leg with fat layer exposed Chronic systolic (congestive) heart failure Abrasion, left foot, subsequent encounter Procedures Wound #14 Pre-procedure diagnosis of Wound #14 is a Venous Leg Ulcer located on the Left,Lateral Lower Leg . There was a Four Layer Compression Therapy Procedure by Baruch Gouty, RN. Post procedure Diagnosis Wound #14: Same as Pre-Procedure There was a Four Layer Compression Therapy Procedure by Deon Pilling, RN. Post procedure Diagnosis Wound #: Same as Pre-Procedure Plan Follow-up Appointments: Return Appointment in 2 weeks. - Thursday Nurse Visit: - Thursday 11/07/2018. Bathing/ Shower/ Hygiene: May shower with protection but do not get wound dressing(s) wet. May shower and wash wound with soap and water. - with dressing changes only. Edema Control - Lymphedema / SCD /  Other: Elevate legs to the level of the heart or above for 30 minutes daily and/or when sitting, a frequency of: - 3-4 times a day throughout the day. Avoid standing for long periods of time. Exercise regularly Non Wound Condition: Other Non Wound Condition Orders/Instructions: - 4 layer compression to right leg apply lotion. wound center weekly and home health once a week. zetuvit or extrasorb padding over blister areas for absorption. Home Health: New wound care orders this week; continue Home Health for wound care. May utilize formulary equivalent dressing for wound treatment orders unless otherwise specified. - [facility] home [facility]. WOUND #14: - Lower Leg Wound Laterality: Left, Lateral Cleanser: Soap and Water (Home Health) 2 x Per Week/30 Days Discharge Instructions: May shower and wash wound with dial antibacterial soap and water prior to dressing change. Peri-Wound Care: Sween Lotion (Moisturizing lotion) (Home Health) 2 x Per Week/30 Days Discharge Instructions: Apply moisturizing lotion as directed Peri-Wound Care: Triamcinolone 15 (g) 2 x Per Week/30 Days Discharge Instructions: Mixed with lotion in clinic only. Use triamcinolone 15 (g) as directed Prim Dressing: KerraCel Ag Gelling Fiber Dressing, 4x5 in (silver alginate) (Home Health) 2 x Per Week/30 Days ary Discharge Instructions: Apply silver alginate to wound bed as instructed Secondary Dressing: Woven Gauze Sponge, Non-Sterile 4x4 in (Home Health) 2 x Per Week/30 Days Discharge Instructions: Apply over primary dressing as directed. Secondary Dressing: ABD Pad, 8x10 (Home Health) 2 x Per Week/30 Days Discharge Instructions: Apply over primary dressing as directed. Com pression Wrap: FourPress (4 layer compression wrap) (Home Health) 2 x Per Week/30 Days Discharge Instructions: ***Apply to BOTH LEGS.***Apply four layer compression as directed. WOUND #15: - T Great Wound Laterality: Left oe Cleanser: Soap and Water [facility])  2 x Per Day/15 Days Discharge Instructions: May shower and wash wound with dial antibacterial soap and water prior to dressing change. Topical: Mupirocin Ointment 2 x Per Day/15 Days Discharge Instructions: ***In Clinic only***. Apply Mupirocin (Bactroban) under Alignate Ag. Topical: PolySporin (Home Health) 2 x Per Day/15 Days Discharge Instructions: patient to purchase and home health to apply under the alginate Ag, Prim Dressing: KerraCel Ag Gelling Fiber Dressing, 2x2 in (silver alginate) ([place]) 2 x Per Day/15 Days ary Discharge Instructions: Apply silver alginate to wound bed as instructed Secondary Dressing: Woven Gauze Sponges 2x2 in ([place]) 2 x Per Day/15 Days Discharge Instructions: Apply over primary dressing as directed. Secured With: Child psychotherapist, Sterile 2x75 (in/in) (Home Health) 2 x Per Day/15 Days Discharge Instructions: Secure with stretch gauze as directed. 1. Would recommend currently that we going continue with the wound care measures as before with regard to the open wounds we will use silver alginate. Over the areas where he has blistering I do not think the alginate is necessary XtraSorb or something of the like will be beneficial as far as absorbing any excess fluid that is draining but otherwise I think the patient does need the 4-layer compression wrap I see nothing that would prohibit that at this point. 2. We will continue with the 4-layer compression wrap. 3. I am also can recommend that the patient needs to elevate his legs much as possible he does sleep in his recliner however this obviously is not the optimal thing. We will see patient back for reevaluation in 1 week here in the clinic. If anything worsens or changes patient will contact our office for additional recommendations. This will be for nurse visit here just to check to make sure things doing okay. We will have a provider visit in 2 weeks. Electronic  Signature(s) Signed: 10/30/2020 1:39:50 PM By: Worthy Keeler PA-C Entered By: Worthy Keeler on 10/30/2020 13:39:50 -------------------------------------------------------------------------------- SuperBill Details Patient Name: Date of Service: [place], [place] 10/30/2020 Medical Record Number: [id number] Patient Account Number: [id number] Date of Birth/Sex: Treating RN: [birth year] (80 y.o. 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

## 2020-10-30 NOTE — Progress Notes (Signed)
ROLLEN, SELDERS ([id number]) Visit Report for 10/30/2020 Arrival Information Details Patient Name: Date of Service: [place], [place] 10/30/2020 12:30 PM Medical Record Number: [id number] Patient Account Number: [id number] Date of Birth/Sex: Treating RN: 1941-05-26 (80 y.o. [name] Kyzen Horn: [name]., [name] Other Clinician: Referring Francenia Chimenti: Treating Taran Haynesworth/Extender: Gareth Morgan., [name] in Treatment: 36 Visit Information History Since Last Visit Added or deleted any medications: No Patient Arrived: Walker Any new allergies or adverse reactions: No Arrival Time: 12:49 Had a fall or experienced change in No Accompanied By: self activities of daily living that may affect Transfer Assistance: None risk of falls: Patient Identification Verified: Yes Signs or symptoms of abuse/neglect since last visito No Secondary Verification Process Completed: Yes Hospitalized since last visit: No Patient Requires Transmission-Based Precautions: No Implantable device outside of the clinic excluding No Patient Has Alerts: Yes cellular tissue based products placed in the center Patient Alerts: Patient on Blood Thinner since last visit: Has Dressing in Place as Prescribed: Yes Pain Present Now: No Electronic Signature(s) Signed: 10/30/2020 5:30:41 PM By: Sandre Kitty Entered By: Sandre Kitty on 10/30/2020 12:49:35 -------------------------------------------------------------------------------- Compression Therapy Details Patient Name: Date of Service: [place] [name] [facility] 10/30/2020 12:30 PM Medical Record Number: [id number] Patient Account Number: [id number] Date of Birth/Sex: Treating RN: 12-May-1941 (80 y.o. [name] Malavika Lira: [name]., [name] Other Clinician: Referring Cynai Skeens: Treating Comer Devins/Extender: Gareth Morgan., [name] in Treatment: 75 Compression Therapy Performed for Wound  Assessment: Wound #14 Left,Lateral Lower Leg Performed By: Clinician Baruch Gouty, RN Compression Type: Four Layer Post Procedure Diagnosis Same as Pre-procedure Electronic Signature(s) Signed: 10/30/2020 5:49:32 PM By: Deon Pilling Entered By: Deon Pilling on 10/30/2020 13:32:29 -------------------------------------------------------------------------------- Compression Therapy Details Patient Name: Date of Service: [place] 10/30/2020 12:30 PM Medical Record Number: [id number] Patient Account Number: [id number] Date of Birth/Sex: Treating RN: 1941-05-19 (80 y.o. [name] Ahsha Hinsley: [name]., [name] Other Clinician: Referring Kalen Neidert: Treating Draycen Leichter/Extender: Gareth Morgan., [name] in Treatment: 35 Compression Therapy Performed for Wound Assessment: NonWound Condition Lymphedema - Right Leg Performed By: Clinician Deon Pilling, RN Compression Type: Four Layer Post Procedure Diagnosis Same as Pre-procedure Electronic Signature(s) Signed: 10/30/2020 5:49:32 PM By: Deon Pilling Entered By: Deon Pilling on 10/30/2020 13:32:40 -------------------------------------------------------------------------------- Encounter Discharge Information Details Patient Name: Date of Service: [place] 10/30/2020 12:30 PM Medical Record Number: [id number] Patient Account Number: [id number] Date of Birth/Sex: Treating RN: 01-23-1941 (80 y.o. [name] Primary Care Makyah Lavigne: [name]., [name] Other Clinician: Referring Zylan Almquist: Treating Jurrell Royster/Extender: Gareth Morgan., [name] in Treatment: 51 Encounter Discharge Information Items Discharge Condition: Stable Ambulatory Status: Walker Discharge Destination: Home Transportation: Other Accompanied By: self Schedule Follow-up Appointment: Yes Clinical Summary of Care: Patient Declined Notes SCAT Electronic Signature(s) Signed: 10/30/2020  6:02:48 PM By: Baruch Gouty RN, BSN Entered By: Baruch Gouty on 10/30/2020 13:55:07 -------------------------------------------------------------------------------- Lower Extremity Assessment Details Patient Name: Date of Service: [place] 10/30/2020 12:30 PM Medical Record Number: [id number] Patient Account Number: [id number] Date of Birth/Sex: Treating RN: Sep 26, 1940 (80 y.o. [name], [name] Primary Care Lavar Rosenzweig: [name]., [name] Other Clinician: Referring Marna Weniger: Treating Mallery Harshman/Extender: Gareth Morgan., [name] in Treatment: 52 Edema Assessment Assessed: [Left: No] [Right: No]  Edema: [Left: Yes] [Right: Yes] Calf Left: Right: Point of Measurement: 53 cm From Medial Instep 37 cm 36.8 cm Ankle Left: Right: Point of Measurement: 14 cm From Medial Instep 24.3 cm 26.5 cm Vascular Assessment Pulses: Dorsalis Pedis Palpable: [Left:Yes] [Right:No] Electronic Signature(s) Signed: 10/30/2020 5:10:58 PM By: Lorrin Jackson Signed: 10/30/2020 5:49:32 PM By: Deon Pilling Entered By: Lorrin Jackson on 10/30/2020 13:09:24 -------------------------------------------------------------------------------- Multi-Disciplinary Care Plan Details Patient Name: Date of Service: [place] 10/30/2020 12:30 PM Medical Record Number: [id number] Patient Account Number: [id number] Date of Birth/Sex: Treating RN: 08-12-1941 (80 y.o. [name] Sundi Slevin: [name]., [name] Other Clinician: Referring Kashia Brossard: Treating Abbagale Goguen/Extender: Gareth Morgan., [name] in Treatment: 66 Multidisciplinary Care Plan reviewed with physician Active Inactive Venous Leg Ulcer Nursing Diagnoses: Knowledge deficit related to disease process and management Potential for venous Insuffiency (use before diagnosis confirmed) Goals: Patient will maintain optimal edema control Date Initiated: 05/01/2020 Target Resolution Date:  11/21/2020 Goal Status: Active Interventions: Assess peripheral edema status every visit. Compression as ordered Provide education on venous insufficiency Notes: Electronic Signature(s) Signed: 10/30/2020 5:49:32 PM By: Deon Pilling Entered By: Deon Pilling on 10/30/2020 12:54:35 -------------------------------------------------------------------------------- Non-Wound Condition Assessment Details Patient Name: Date of Service: [place], [place] 10/30/2020 12:30 PM Medical Record Number: [id number] Patient Account Number: [id number] Date of Birth/Sex: Treating RN: [birth year] (80 y.o. [name] Cynithia Hakimi: [name]., [name] Other Clinician: Referring [facility]: Treating Kaymen Adrian/Extender: Gareth Morgan., [name] in Treatment: 90 Non-Wound Condition: Condition: Lymphedema Location: Leg Side: Right Notes 5 new blisters noted on right lower leg Electronic Signature(s) Signed: 10/30/2020 5:10:58 PM By: Lorrin Jackson Signed: 10/30/2020 5:49:32 PM By: Deon Pilling Entered By: Lorrin Jackson on 10/30/2020 13:13:19 -------------------------------------------------------------------------------- Pain Assessment Details Patient Name: Date of Service: [place] 10/30/2020 12:30 PM Medical Record Number: [id number] Patient Account Number: [id number] Date of Birth/Sex: Treating RN: 04-01-1941 (80 y.o. [name] Rhianna Raulerson: [name]., [name] Other Clinician: Referring Troyce Gieske: Treating Alwaleed Obeso/Extender: Gareth Morgan., [name] in Treatment: 7 Active Problems Location of Pain Severity and Description of Pain Patient Has Paino No Site Locations Pain Management and Medication Current Pain Management: Electronic Signature(s) Signed: 10/30/2020 5:30:41 PM By: Sandre Kitty Signed: 10/30/2020 5:49:32 PM By: Deon Pilling Entered By: Sandre Kitty on 10/30/2020  12:50:09 -------------------------------------------------------------------------------- Patient/Caregiver Education Details Patient Name: Date of Service: [place], [place] 3/3/2022andnbsp12:30 PM Medical Record Number: [id number] Patient Account Number: [id number] Date of Birth/Gender: Treating RN: [birth year] (80 y.o. [name] Physician: [name]., [name] Other Clinician: Referring Physician: Treating Physician/Extender: Gareth Morgan., [name] in Treatment: 26 Education Assessment Education Provided To: Patient Education Topics Provided Wound/Skin Impairment: Handouts: Skin Care Do's and Dont's Methods: Explain/Verbal Responses: Reinforcements needed Electronic Signature(s) Signed: 10/30/2020 5:49:32 PM By: Deon Pilling Entered By: Deon Pilling on 10/30/2020 12:54:54 -------------------------------------------------------------------------------- Wound Assessment Details Patient Name: Date of Service: [place] [name] [facility] 10/30/2020 12:30 PM Medical Record Number: [id number] Patient Account Number: [id number] Date of Birth/Sex: Treating RN: 1941/05/18 (80 y.o. [name], [name] Primary Care Barrett Holthaus: [name]., [name] Other Clinician: Referring Kaylanie Capili: Treating Jadier Rockers/Extender: Gareth Morgan., [name] in Treatment: 89 Wound Status Wound Number: 14 Primary Venous Leg Ulcer Etiology: Wound Location: Left, Lateral  Lower Leg Wound Open Wounding Event: Blister Status: Date Acquired: 10/02/2020 Comorbid Anemia, Congestive Heart Failure, Hypertension, Peripheral Weeks Of Treatment: 4 History: Venous Disease, End Stage Renal Disease Clustered Wound: No Photos Wound Measurements Length: (cm) 0.8 Width: (cm) 0.7 Depth: (cm) 0.1 Area: (cm) 0.44 Volume: (cm) 0.044 % Reduction in Area: 53.3% % Reduction in Volume: 76.6% Epithelialization: Medium (34-66%) Wound Description Classification: Full  Thickness Without Exposed Support Structures Wound Margin: Distinct, outline attached Exudate Amount: Medium Exudate Type: Serosanguineous Exudate Color: red, brown Foul Odor After Cleansing: No Slough/Fibrino Yes Wound Bed Granulation Amount: Large (67-100%) Exposed Structure Granulation Quality: Pink Fascia Exposed: No Necrotic Amount: Small (1-33%) Fat Layer (Subcutaneous Tissue) Exposed: Yes Necrotic Quality: Adherent Slough Tendon Exposed: No Muscle Exposed: No Joint Exposed: No Bone Exposed: No Treatment Notes Wound #14 (Lower Leg) Wound Laterality: Left, Lateral Cleanser Soap and Water Discharge Instruction: May shower and wash wound with dial antibacterial soap and water prior to dressing change. Peri-Wound Care Sween Lotion (Moisturizing lotion) Discharge Instruction: Apply moisturizing lotion as directed Triamcinolone 15 (g) Discharge Instruction: Mixed with lotion in clinic only. Use triamcinolone 15 (g) as directed Topical Primary Dressing KerraCel Ag Gelling Fiber Dressing, 4x5 in (silver alginate) Discharge Instruction: Apply silver alginate to wound bed as instructed Secondary Dressing Woven Gauze Sponge, Non-Sterile 4x4 in Discharge Instruction: Apply over primary dressing as directed. ABD Pad, 8x10 Discharge Instruction: Apply over primary dressing as directed. Secured With Compression Wrap FourPress (4 layer compression wrap) Discharge Instruction: ***Apply to BOTH LEGS.***Apply four layer compression as directed. Compression Stockings Add-Ons Electronic Signature(s) Signed: 10/30/2020 5:30:41 PM By: Sandre Kitty Signed: 10/30/2020 5:49:32 PM By: Deon Pilling Previous Signature: 10/30/2020 5:10:58 PM Version By: Lorrin Jackson Entered By: Sandre Kitty on 10/30/2020 17:20:35 -------------------------------------------------------------------------------- Wound Assessment Details Patient Name: Date of Service: [place] 10/30/2020 12:30  PM Medical Record Number: [id number] Patient Account Number: [id number] Date of Birth/Sex: Treating RN: [birth year] (80 y.o. [name], [name] Primary Care Shuree Brossart: Other Clinician: [name]., [name] Referring Almando Brawley: Treating Najla Aughenbaugh/Extender: Gareth Morgan., [name] in Treatment: 29 Wound Status Wound Number: 15 Primary Infection - not elsewhere classified Etiology: Wound Location: Left T Great oe Wound Open Wounding Event: Gradually Appeared Status: Date Acquired: 10/09/2020 Comorbid Anemia, Congestive Heart Failure, Hypertension, Peripheral Weeks Of Treatment: 3 History: Venous Disease, End Stage Renal Disease Clustered Wound: No Photos Wound Measurements Length: (cm) 0.8 Width: (cm) 1.5 Depth: (cm) 0.1 Area: (cm) 0.942 Volume: (cm) 0.094 % Reduction in Area: 40% % Reduction in Volume: 40.1% Epithelialization: None Tunneling: No Undermining: No Wound Description Classification: Full Thickness Without Exposed Support Structures Wound Margin: Distinct, outline attached Exudate Amount: Medium Exudate Type: Serosanguineous Exudate Color: red, brown Foul Odor After Cleansing: No Slough/Fibrino Yes Wound Bed Granulation Amount: Large (67-100%) Exposed Structure Granulation Quality: Pink Fascia Exposed: No Necrotic Amount: None Present (0%) Fat Layer (Subcutaneous Tissue) Exposed: Yes Tendon Exposed: No Muscle Exposed: No Joint Exposed: No Bone Exposed: No Treatment Notes Wound #15 (Toe Great) Wound Laterality: Left Cleanser Soap and Water Discharge Instruction: May shower and wash wound with dial antibacterial soap and water prior to dressing change. Peri-Wound Care Topical Mupirocin Ointment Discharge Instruction: ***In Clinic only***. Apply Mupirocin (Bactroban) under Alignate Ag. PolySporin Discharge Instruction: patient to purchase and home health to apply under the alginate Ag, Primary Dressing KerraCel Ag Gelling Fiber  Dressing, 2x2 in (silver alginate) Discharge Instruction: Apply silver alginate to wound bed as instructed Secondary Dressing  Woven Gauze Sponges 2x2 in Discharge Instruction: Apply over primary dressing as directed. Secured With Conforming Stretch Gauze Bandage, Sterile 2x75 (in/in) Discharge Instruction: Secure with stretch gauze as directed. Compression Wrap Compression Stockings Add-Ons Electronic Signature(s) Signed: 10/30/2020 5:30:41 PM By: Sandre Kitty Signed: 10/30/2020 5:49:32 PM By: Deon Pilling Previous Signature: 10/30/2020 5:10:58 PM Version By: Lorrin Jackson Entered By: Sandre Kitty on 10/30/2020 17:19:59 -------------------------------------------------------------------------------- Vitals Details Patient Name: Date of Service: [place] 10/30/2020 12:30 PM Medical Record Number: [id number] Patient Account Number: [id number] Date of Birth/Sex: Treating RN: June 11, 1941 (80 y.o. [name], [name] Primary Care Ksean Vale: [name]., [name] Other Clinician: Referring Dirk Vanaman: Treating Rehan Holness/Extender: Gareth Morgan., [name] in Treatment: 72 Vital Signs Time Taken: 12:49 Temperature (F): 97.8 Height (in): 71 Pulse (bpm): 90 Weight (lbs): 198 Respiratory Rate (breaths/min): 22 Body Mass Index (BMI): 27.6 Blood Pressure (mmHg): 105/71 Reference Range: 80 - 120 mg / dl Electronic Signature(s) Signed: 10/30/2020 5:30:41 PM By: Sandre Kitty Entered By: Sandre Kitty on 10/30/2020 12:49:51

## 2020-11-06 ENCOUNTER — Encounter (HOSPITAL_BASED_OUTPATIENT_CLINIC_OR_DEPARTMENT_OTHER): Payer: Medicare Other | Admitting: Physician Assistant

## 2020-11-06 ENCOUNTER — Other Ambulatory Visit: Payer: Self-pay

## 2020-11-06 DIAGNOSIS — I87323 Chronic venous hypertension (idiopathic) with inflammation of bilateral lower extremity: Secondary | ICD-10-CM | POA: Diagnosis not present

## 2020-11-06 NOTE — Progress Notes (Signed)
[name], [name] ([id number]) Visit Report for 11/06/2020 Arrival Information Details Patient Name: Date of Service: [place], [place] 11/06/2020 9:15 A M Medical Record Number: [id number] Patient Account Number: [id number] Date of Birth/Sex: Treating RN: Apr 03, 1941 (80 y.o. [name] Primary Care [name]: [name]., [name] Other Clinician: Referring [name]: Treating [name]/Extender: [name]., [name] in Treatment: 41 Visit Information History Since Last Visit Added or deleted any medications: No Patient Arrived: [name] Any new allergies or adverse reactions: No Arrival Time: 08:48 Had a fall or experienced change in No Accompanied By: self activities of daily living that may affect Transfer Assistance: None risk of falls: Patient Identification Verified: Yes Signs or symptoms of abuse/neglect since last visito No Secondary Verification Process Completed: Yes Hospitalized since last visit: No Patient Requires Transmission-Based Precautions: No Implantable device outside of the clinic excluding No Patient Has Alerts: Yes cellular tissue based products placed in the center Patient Alerts: Patient on Blood Thinner since last visit: Has Dressing in Place as Prescribed: Yes Has Compression in Place as Prescribed: Yes Pain Present Now: No Electronic Signature(s) Signed: 11/06/2020 5:47:26 PM By: [name] Entered By: [name] on 11/06/2020 10:09:16 -------------------------------------------------------------------------------- Compression Therapy Details Patient Name: Date of Service: [place] [name] [place] [facility] 11/06/2020 9:15 A M Medical Record Number: [id number] Patient Account Number: [id number] Date of Birth/Sex: Treating RN: 11-06-1940 (80 y.o. [name] Primary Care Misbah Hornaday: [name]., [name] Other Clinician: Referring [name]: Treating [name]/Extender: [name]., [name] in Treatment:  25 Compression Therapy Performed for Wound Assessment: Wound #14 Left,Lateral Lower Leg Performed By: Clinician Troy Pilling, RN Compression Type: Four Layer Electronic Signature(s) Signed: 11/06/2020 5:47:26 PM By: [name] Entered By: [name] on 11/06/2020 10:10:24 -------------------------------------------------------------------------------- Compression Therapy Details Patient Name: Date of Service: [place], [place] 11/06/2020 9:15 A M Medical Record Number: [id number] Patient Account Number: [id number] Date of Birth/Sex: Treating RN: [birth year] (80 y.o. [name] Primary Care Kolbi Altadonna: [name]., [name] Other Clinician: Referring Jabre Heo: Treating [name]/Extender: [name]., [name] in Treatment: 72 Compression Therapy Performed for Wound Assessment: NonWound Condition Lymphedema - Right Leg Performed By: Clinician Troy Pilling, RN Compression Type: Four Layer Electronic Signature(s) Signed: 11/06/2020 5:47:26 PM By: [name] Entered By: [name] on 11/06/2020 10:10:35 -------------------------------------------------------------------------------- Encounter Discharge Information Details Patient Name: Date of Service: [place], [place] 11/06/2020 9:15 A M Medical Record Number: [id number] Patient Account Number: [id number] Date of Birth/Sex: Treating RN: 17-Jul-1941 (80 y.o. [name] Primary Care Costas Sena: [name]., [name] Other Clinician: Referring [name]: Treating [name]/Extender: [name]., [name] in Treatment: 32 Encounter Discharge Information Items Discharge Condition: Stable Ambulatory Status: [name] Discharge Destination: Home Transportation: Private Auto Accompanied By: self Schedule Follow-up Appointment: Yes Clinical Summary of Care: Electronic Signature(s) Signed: 11/06/2020 5:47:26 PM By: [name] Entered By: [name] on 11/06/2020  10:12:58 -------------------------------------------------------------------------------- Patient/Caregiver Education Details Patient Name: Date of Service: [place], [place] 3/10/2022andnbsp9:15 [place] Record Number: [id number] Patient Account Number: [id number] Date of Birth/Gender: Treating RN: 30-Jun-1941 (80 y.o. [name], [name] Primary Care Physician: [name]., [name] Other Clinician: Referring Physician: Treating Physician/Extender: [name]., [name] in Treatment: 61 Education Assessment Education Provided To: Patient Education Topics Provided Venous: Handouts: Managing Venous Disease  and Related Ulcers Methods: Explain/Verbal Responses: Reinforcements needed Electronic Signature(s) Signed: 11/06/2020 5:47:26 PM By: [name] Entered By: [name] on 11/06/2020 10:12:47 -------------------------------------------------------------------------------- Wound Assessment Details Patient Name: Date of Service: [place], [place] 11/06/2020 9:15 A M Medical Record Number: [id number] Patient Account Number: [id number] Date of Birth/Sex: Treating RN: 08-16-1941 (80 y.o. [name], [name] Primary Care [name]: [name]., [name] Other Clinician: Referring [name]: Treating [name]/Extender: [name]., [name] in Treatment: 33 Wound Status Wound Number: 14 Primary Venous Leg Ulcer Etiology: Wound Location: Left, Lateral Lower Leg Wound Open Wounding Event: Blister Status: Date Acquired: 10/02/2020 Comorbid Anemia, Congestive Heart Failure, Hypertension, Peripheral Weeks Of Treatment: 5 History: Venous Disease, End Stage Renal Disease Clustered Wound: No Wound Measurements Length: (cm) 0.8 Width: (cm) 0.7 Depth: (cm) 0.1 Area: (cm) 0.44 Volume: (cm) 0.044 % Reduction in Area: 53.3% % Reduction in Volume: 76.6% Epithelialization: Medium (34-66%) Tunneling: No Undermining: No Wound  Description Classification: Full Thickness Without Exposed Support Structures Wound Margin: Distinct, outline attached Exudate Amount: Medium Exudate Type: Serosanguineous Exudate Color: red, brown Foul Odor After Cleansing: No Slough/Fibrino Yes Wound Bed Granulation Amount: None Present (0%) Exposed Structure Necrotic Amount: Large (67-100%) Fascia Exposed: No Necrotic Quality: Adherent Slough Fat Layer (Subcutaneous Tissue) Exposed: Yes Tendon Exposed: No Muscle Exposed: No Joint Exposed: No Bone Exposed: No Treatment Notes Wound #14 (Lower Leg) Wound Laterality: Left, Lateral Cleanser Soap and Water Discharge Instruction: May shower and wash wound with dial antibacterial soap and water prior to dressing change. Peri-Wound Care Sween Lotion (Moisturizing lotion) Discharge Instruction: Apply moisturizing lotion as directed Triamcinolone 15 (g) Discharge Instruction: Mixed with lotion in clinic only. Use triamcinolone 15 (g) as directed Topical Primary Dressing KerraCel Ag Gelling Fiber Dressing, 4x5 in (silver alginate) Discharge Instruction: Apply silver alginate to wound bed as instructed Secondary Dressing Woven Gauze Sponge, Non-Sterile 4x4 in Discharge Instruction: Apply over primary dressing as directed. ABD Pad, 8x10 Discharge Instruction: Apply over primary dressing as directed. Secured With Compression Wrap FourPress (4 layer compression wrap) Discharge Instruction: ***Apply to BOTH LEGS.***Apply four layer compression as directed. Compression Stockings Add-Ons Electronic Signature(s) Signed: 11/06/2020 5:47:26 PM By: [name] Entered By: [name] on 11/06/2020 10:10:01 -------------------------------------------------------------------------------- Wound Assessment Details Patient Name: Date of Service: [place], [place] 11/06/2020 9:15 A M Medical Record Number: [id number] Patient Account Number: [id number] Date of Birth/Sex: Treating  RN: 02-12-1941 (80 y.o. [name], [name] Primary Care Broox Lonigro: [name]., [name] Other Clinician: Referring Nester Bachus: Treating Hasina Kreager/Extender: [name]., [name] in Treatment: 80 Wound Status Wound Number: 15 Primary Infection - not elsewhere classified Etiology: Wound Location: Left T Great oe Wound Open Wounding Event: Gradually Appeared Status: Date Acquired: 10/09/2020 Comorbid Anemia, Congestive Heart Failure, Hypertension, Peripheral Weeks Of Treatment: 4 History: Venous Disease, End Stage Renal Disease Clustered Wound: No Wound Measurements Length: (cm) 0.8 Width: (cm) 1.5 Depth: (cm) 0.1 Area: (cm) 0.942 Volume: (cm) 0.094 % Reduction in Area: 40% % Reduction in Volume: 40.1% Epithelialization: Medium (34-66%) Tunneling: No Undermining: No Wound Description Classification: Full Thickness Without Exposed Support Structures Wound Margin: Distinct, outline attached Exudate Amount: Medium Exudate Type: Serosanguineous Exudate Color: red, brown Foul Odor After Cleansing: No Slough/Fibrino Yes Wound Bed Granulation Amount: Large (67-100%) Exposed Structure Granulation Quality: Pink Fascia Exposed: No Necrotic Amount: None Present (0%) Fat Layer (Subcutaneous Tissue) Exposed: Yes Tendon Exposed: No Muscle Exposed: No Joint Exposed: No  Bone Exposed: No Treatment Notes Wound #15 (Toe Great) Wound Laterality: Left Cleanser Soap and Water Discharge Instruction: May shower and wash wound with dial antibacterial soap and water prior to dressing change. Peri-Wound Care Topical Mupirocin Ointment Discharge Instruction: ***In Clinic only***. Apply Mupirocin (Bactroban) under Alignate Ag. PolySporin Discharge Instruction: patient to purchase and home health to apply under the alginate Ag, Primary Dressing KerraCel Ag Gelling Fiber Dressing, 2x2 in (silver alginate) Discharge Instruction: Apply silver alginate to wound bed as  instructed Secondary Dressing Woven Gauze Sponges 2x2 in Discharge Instruction: Apply over primary dressing as directed. Secured With Conforming Stretch Gauze Bandage, Sterile 2x75 (in/in) Discharge Instruction: Secure with stretch gauze as directed. Compression Wrap Compression Stockings Add-Ons Electronic Signature(s) Signed: 11/06/2020 5:47:26 PM By: [name] Entered By: [name] on 11/06/2020 10:10:12 -------------------------------------------------------------------------------- Vitals Details Patient Name: Date of Service: [place], [place] 11/06/2020 9:15 A M Medical Record Number: [id number] Patient Account Number: [id number] Date of Birth/Sex: Treating RN: 1940/12/26 (80 y.o. [name], [name] Primary Care Versie Fleener: [name]., [name] Other Clinician: Referring Adoni Greenough: Treating Michell Giuliano/Extender: [name]., [name] in Treatment: 8 Vital Signs Time Taken: 08:48 Temperature (F): 97.6 Height (in): 71 Pulse (bpm): 103 Weight (lbs): 198 Respiratory Rate (breaths/min): 20 Body Mass Index (BMI): 27.6 Blood Pressure (mmHg): 114/74 Reference Range: 80 - 120 mg / dl Electronic Signature(s) Signed: 11/06/2020 5:47:26 PM By: [name] Entered By: [name] on 11/06/2020 10:09:31

## 2020-11-06 NOTE — Progress Notes (Signed)
RUSH, SALCE ([id number]) Visit Report for 11/06/2020 SuperBill Details Patient Name: Date of Service: [name], [place] 11/06/2020 Medical Record Number: [id number] Patient Account Number: [id number] Date of Birth/Sex: Treating RN: 15-Oct-1940 (80 y.o. [name], [name] Primary Care Provider: [name]., [name] Other Clinician: Referring Provider: Treating Provider/Extender: Gareth Morgan., [name] in Treatment: 56 Diagnosis Coding ICD-10 Codes Code Description 9404872434 Chronic venous hypertension (idiopathic) with inflammation of bilateral lower extremity I89.0 Lymphedema, not elsewhere classified L97.822 Non-pressure chronic ulcer of other part of left lower leg with fat layer exposed L97.812 Non-pressure chronic ulcer of other part of right lower leg with fat layer exposed L97.67 Chronic systolic (congestive) heart failure S90.812D Abrasion, left foot, subsequent encounter Facility Procedures The patient participates with Medicare or their insurance follows the Medicare Facility Guidelines CPT4 Description Modifier Quantity Code [id number] BILATERAL: Application of multi-layer venous compression system; leg (below knee), including ankle and 1 foot. Electronic Signature(s) Signed: 11/06/2020 5:28:00 PM By: Worthy Keeler PA-C Signed: 11/06/2020 5:47:26 PM By: Deon Pilling Entered By: Deon Pilling on 11/06/2020 10:13:05

## 2020-11-13 ENCOUNTER — Other Ambulatory Visit: Payer: Self-pay

## 2020-11-13 ENCOUNTER — Encounter (HOSPITAL_BASED_OUTPATIENT_CLINIC_OR_DEPARTMENT_OTHER): Payer: Medicare Other | Admitting: Internal Medicine

## 2020-11-13 DIAGNOSIS — I87323 Chronic venous hypertension (idiopathic) with inflammation of bilateral lower extremity: Secondary | ICD-10-CM | POA: Diagnosis not present

## 2020-11-13 NOTE — Progress Notes (Signed)
[name], [name] ([id number]) Visit Report for 11/13/2020 HPI Details Patient Name: Date of Service: [place] 11/13/2020 8:00 [place] Record Number: [id number] Patient Account Number: [id number] Date of Birth/Sex: Treating RN: Sep 15, 1940 (80 y.o. [name], [name] Primary Care Provider: [name]., [name] Other Clinician: Referring Provider: Treating Provider/Extender: [name], [name] in Treatment: 48 History of Present Illness HPI Description: ADMISSION 10/11/2019 This is a 80 year old man with a history of a severe cardiomyopathy with an ejection fraction of about 20%, chronic renal failure stage III. He is listed as a type II diabetic in epic although the patient denies this. He also has a history of PVD. He states for the last month he has had wounds on his bilateral lower extremities that started off as blisters which denuded. He has areas on the left lateral calf and 2 on the right lateral. He has an area on the left first met head which he did not know was there he we identified this on intake. He has been using Silvadene cream provided by his primary care physician but he is complaining that this burns. 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 He was acutely ill at that time complicating GI bleed. ABIs in our clinic were 1.16 on the right and 1.13 on the left 2/25; the patient comes in with his areas on the left lateral and right lateral calf. There is also an area over the left first MTP bunion deformity. We have been using Sorbact. His edema control is fairly good 3/4; left lateral and  right lateral calf. Most of his wounds are in the same position tightly adherent nonviable debris. On the right we debrided the superior wound on the left both wounds. The area on his bunion over the left first MTP medially is I think just about closed. We have been using Sorbact without a lot of success changed to Iodoflex under compression 3/11; left lateral and right lateral calf perhaps minor improvement in the surface condition. We have been using Iodoflex. The area over the bunion of the left first MTP has closed over 3/18; left lateral and right lateral calf not much improvement. We have been using Iodoflex. Aggressive debridement last week. 3/25; left lateral and right lateral calf. We have been using Iodoflex. There is some improvement in the superior area on the right and 2 on the lateral left although there is still a lot of debris on the surface. The inferior area on the right is still a completely nonviable surface. 4/8; left lateral and right lateral calfs. Essentially mirror-image looking wounds 2 wounds on each side in close juxtaposition we have been using Iodoflex with some improvement in the very adherent fibrinous debris but not a lot. The patient has arterial studies next Wednesday morning and venous studies next Thursday morning. I have been avoiding any further aggressive debridement until we see the arterial study results. His ABIs were fairly good in this clinic and is dorsalis pedis pulses are palpable but the wound beds are pale. 4/15; left lateral and right lateral calfs. Essentially mirror-image wounds with 2 wounds on each side in close juxtaposition  but with rims of separating normal tissue. We have been using Iodoflex. The base of the wounds has been cleaning up quite nicely ARTERIAL STUDIES were done showing the patient had an ABI on the right of 1.27 with triphasic waveforms and a TBI of 0.90 on the left triphasic waveforms with an ABI of 1.28 and a TBI of 0.87. No  evidence of arterial disease VENOUS REFLUX STUDIES; were done yesterday we do not have this report yet. 4/23; VENOUS REFLUX STUDIES did not show any significant reflux right lower extremity. No evidence of a DVT He did have significant reflux in the common . femoral vein on the left but nothing else was listed as significant. He did not have a DVT. We have been using Iodoflex under compression his wounds are making progress. 4/29; improvements in the wound surface continue. We have been using Iodoflex. He has a 20% out-of-pocket co-pay for Apligraf which is unfortunate. We changed him to Sorbact today 5/6; started on Sorbact last week. Better looking wound surface but not much change in dimensions. 01/24/20-Patient is back at 3 weeks, wound surfaces are about the same but very minimal slough on the right leg wounds, however there is blistering on both legs adjacent to the wounds, patient denies any other symptoms or new symptoms. His studies have been reviewed and do not indicate any significant arterial or venous disease. But he is attending once in 3 weeks with home health changing in between 6/3; patient has 4 wounds 2 on each side of his lateral lower legs. These wounds are somewhat improved. He apparently arrived in clinic last week with a large blister on the right lateral lower leg that had denuded into the wound. They changed to silver alginate. Still under compression. He has straight Medicare and unfortunately has an unlimited co-pay for advanced treatment options. 6/10; his original 4 wounds are all better especially on the left lateral lower leg. 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 6/24; blister on the right lateral lower leg has healed. There is no additional wounds. The traumatic left second toe is also closed. The 4 that he has are all better 7/1; all of the patient's wounds are smaller except for the proximal one on the left lateral calf. We are using silver collagen 7/22; patient has home health. Relatively refractory wounds on the lateral part of both mid calfs. Each of them 2 wounds. The areas on the left are doing much better in fact the distal 1 looks like it is on his way to closure. The right required debridement with adherent fibrinous debris under illumination. We have been using silver collagen. Previous arterial studies were within normal limits he also had venous reflux studies 04/03/2020 upon evaluation today patient appears to be doing excellent with regard to his lower extremity ulcers. He is going to require a little bit of debridement on the wounds on the right especially in order to clear away some biofilm and slough but this is minimal and overall he seems to be doing quite well. 8/19; the patient's wounds on the left lateral lower leg just above closed. However the 2  on the lateral part of the right have a nonviable necrotic surface. This is disappointing. There is also no change in dimensions. He has had arterial studies as well as venous reflux studies 8/26; very disappointing today. Even the more superficial areas on the left are about the same as last week. Still 2 punched-out areas completely unchanged on the right. We have been using silver collagen really making no progress. 9/2; changed to Iodoflex last week better looking wound surfaces especially on the right although they are deep and punched out. The areas on the left are more superficial open 1 of these is almost fully epithelialized although it has been this way for at least 2 weeks. He has a 20% co-pay [Medicare only] unaffordable for a skin  substitute. Had some thought about a snap VAC on the deep areas on the right leg we will try to put this through in the insurance 9/9; punched-out wounds on the bilateral lateral lower extremities. We have been operating as if the these were chronic venous wounds with secondary lymphedema. The areas on the left have been doing well in fact one of them is closed over. We have had no improvement in the areas on the right we have been using Iodoflex to help with surface debridement. The areas on the right are deeper wider. I do not see any evidence of infection. Home health is not been putting the wraps on high enough he has localized lymphedema right above the wounds. He has had arterial and venous studies already 9/16; punched-out mirror-image wounds on his bilateral lateral mid calfs. We have 1 closed on the left lateral the other smaller. For the first time today the areas on the right lateral actually looks some better. I did a biopsy of 1 of these last time although we still do not have this result. HOWEVER he arrives in clinic today complaining of chest pain overnight which felt like a brick on his chest. He also had 2 black watery bowel movements. He does not have nausea or vomiting. He is not describing abdominal pain. He has no prior history of diarrhea heartburn etc. 9/30; sent this patient to the ER on 9/16. 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 He is on twice daily PPIs Protonix. His admission hemoglobin was exceptionally low at 5. 8.. Discharged at 8.1. 4 unit transfusion. 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 His diuretic dose was actually decreased because of elevated creatinine currently at torsemide 20 mg not felt to be a Coumadin candidate. Patient states he feels well. The areas on his left leg are healed. The areas on the right look better. These have been very refractory  wounds. He does not have stockings we will make arrangements for this today 10/7; the areas on the left leg are healed we will transition him into his 20/30 stocking today. Continued improvement in the areas on the right lateral leg 10/21; the areas on the left leg remain healed at this 2-week follow-up however there is a history that he developed some drainage from one of the wound sites with home health therefore going ahead and wrapping him. He was supposed to be on 20/30 stockings although the history here is vague and I am not sure that this represents a stocking failure or not. We have been wrapping his right leg with the one remaining wound is the distal wound. We  have been using silver collagen 10/28 the patient only has 1 wound remaining on the right lower calf. This still has 3 mm of depth which is unchanged but the surface area is smaller. The superior wound on the right lateral is closed. The 2 wounds on the left remain closed and he is using his compression stocking 11/4 1 remaining wound on the right lower calf. Under illumination not a viable surface we have been using silver collagen we had good effect on the other areas but this 1 appears to be stalled. There is no open area on the left leg he is using his own stocking. 11/11; 1 remaining wound on the right lower calf. Surface of this is a lot better than last week but still requiring debridement I am using Iodoflex but looking forward to changing the primary dressing to perhaps endoform 11/18; 1 remaining wound on the right lower leg, venous insufficiency, vigorous debridement last week/Iodoflex. Depth of the wound is therefore larger but the wound looks clean. Still very gritty material at the surface requiring debridement 12/2; the remaining wound on his right lower leg, venous insufficiency. I changed him to endoform 2 weeks ago. We appear to be making nice progress. Wound is measuring smaller 12/16; he comes in today with one of  his two wounds on the left opened infected second area on the left may be threatened as well as the healed one on the right. He has edema in both legs which I think is pitting. He says his weights are stable he has unknown fairly severe cardiomyopathy. His cardiologist is Dr. Brigitte Pulse it does not sound like he is really been carefully followed 12/23; the areas that I was concerned about on the left look a lot better we put some silver alginate and put him back in compression. It may be the compression that actually does the trick here. The remaining open area we have been using Iodoflex and again this looks a lot better. At our suggestion he went to see his primary who did not adjust his diuretic he is still apparently taking 20 mg of a loop diuretic and "sometimes" 40 mg is basically what the patient stated 1/6; the area on the left is closed once again. We will transition him into his 20/30 stocking from elastic therapy both areas on the right are now open which is a deterioration from last time at which time the only 1 was open but they are very superficial. We have been using Hydrofera Blue under compression 1/27; its been 3 weeks since the patient was here. We transitioned him to a 20/30 stocking on the left leg. Still under 4-layer compression on the right. He comes in today with a new small opening on the left leg but reopening on the right. Massive blistering on the right leg. He has severe systemic fluid volume overload. I have looked over the patient's records. He was seen in 2020 by tele health by cardiology. 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 I think he was supposed to have follow-up but that follow-up never seems to have happened. He does not complain of chest pain or shortness of breath but notes increasing edema not just in his lower legs 2/3; he has new open areas on the bilateral anterior lower legs. I think a lot of this is  because of systemic fluid volume overload. The ultrasound I ordered showed ascites and suggestion of cirrhosis. His physical exam  continues to suggest right greater than left congestive heart failure. I have him taking 40 mg of Lasix oral daily I have asked him to start weighing himself daily. With the intense efforts of our nursing staff we have him a cardiology appointment on Monday or Tuesday of next week I have told him not to miss this. With regards to his wounds I do not think we are going to be able to stop wrapping him until somebody manages to get his fluid volume down 2/10; the areas on his bilateral lateral lower legs actually look better today. He has been taking Lasix 40 mg a day I believe [2 x 20 mg]. He seems to have less edema in his legs and and is a result of this I think he has less edema in his lower legs as well and the wounds appear to be doing better. He comes in today with purulence coming out of the toenail bed of the left great toe it appears that his toenail came off there was noted to be pus I did a culture of this area 2/24; since the patient was last here 2 weeks ago he was admitted to hospital from 2/12 through 2/16 predominantly acute on chronic systolic heart failure. He also has stage IV chronic kidney disease discharge creatinine at 2.71 and on top of this morphology of the liver on ultrasound suggested cirrhosis of unclear etiology. He had 9.4 L of ascites tapped I am unclear whether this was sent for analysis at the time of this dictation. He is currently taking 40 mg of Demadex a day I am a bit surprised that was so low. Nevertheless the wounds on his legs are a lot better everything is closed on the right he still has an open area on the left and then the area on the great toe nail matrix on the left from last time. 10/30/2020 upon evaluation today patient appears to be doing well with regard to his legs other than the fact that they are extremely swollen. He has  multiple blisters noted bilateral lower extremities. Apparently the home health nurse came out yesterday unwrapped them and noticed a couple blisters she called 911 and would not rewrap him. There is no evidence of infection whatsoever we did not receive a phone call that we are aware of anyway regarding the fact that the home health nurse had any concerns at all. Nonetheless the major issue here is that the patient is going to really need to make sure that his fluids under good control but I do not see anything that indicates he needs to go to the hospital right now. There is literally no evidence of infection although fluid is just completely clear. 3/16; patient has home health. Apparently the last time he was here home health did not put compression wraps back on his legs after discovering blisters. By the time he got into see [place] he had had several additional blisters and wounds open up. He was put back in 4 layer compression most of this appears to be healing. He still has small superficial areas on his bilateral lower extremities He also has an area in the left great toe nailbed after lost his toe nail. Electronic Signature(s) Signed: 11/13/2020 5:46:09 PM By: Linton Ham MD Entered By: Linton Ham on 11/13/2020 09:44:01 -------------------------------------------------------------------------------- Physical Exam Details Patient Name: Date of Service: [place] 11/13/2020 8:00 [place] Record Number: [id number] Patient Account Number: [id number] Date of Birth/Sex: Treating RN:  1941-02-10 (80 y.o. [name], [name] Primary Care Provider: [name]., [name] Other Clinician: Referring Provider: Treating Provider/Extender: [name], [name] in Treatment: 74 Cardiovascular His pedal pulses have always been robust. This is never been an issue. Excellent edema control bilaterally. Notes Wound exam; the patient has superficial areas bilaterally  mostly in the same area as his initial wounds on the right lateral and left that lateral. The multiple blisters over his bilateral lower extremities that were noted last time most of these of either closed or fully epithelialized. The area over his left first toenail bed is nice healthy red granulation tissue Electronic Signature(s) Signed: 11/13/2020 5:46:09 PM By: Linton Ham MD Entered By: Linton Ham on 11/13/2020 09:45:11 -------------------------------------------------------------------------------- Physician Orders Details Patient Name: Date of Service: [place] 11/13/2020 8:00 [place] Record Number: [id number] Patient Account Number: [id number] Date of Birth/Sex: Treating RN: 08-Dec-1940 (80 y.o. 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 - Thursday Return A Bathing/ Shower/ Hygiene May shower with protection but do not get wound dressing(s) wet. May shower and wash wound with soap and water. - with dressing changes only. Edema Control - Lymphedema / SCD / Other Elevate legs to the level of the heart or above for 30 minutes daily and/or when sitting, a frequency of: - 3-4 times a day throughout the day. Avoid standing for long periods of time. Exercise regularly Non Wound  Condition Right Lower Extremity Other Non Wound Condition Orders/Instructions: - 4 layer compression to right leg apply lotion. wound center weekly and home health once a week. zetuvit or extrasorb padding over blister areas for absorption. Home Health No change in wound care orders this week; continue Home Health for wound care. May utilize formulary equivalent dressing for wound treatment orders unless otherwise specified. - [facility] home health twice a week. Wound Treatment Wound #14 - Lower Leg Wound Laterality: Left, Lateral Cleanser: Soap and Water (Home Health) 2 x Per Week/30 Days Discharge Instructions: May shower and wash wound with dial antibacterial soap and water prior to dressing change. Peri-Wound Care: Sween Lotion (Moisturizing lotion) (Home Health) 2 x Per Week/30 Days Discharge Instructions: Apply moisturizing lotion as directed Peri-Wound Care: Triamcinolone 15 (g) 2 x Per Week/30 Days Discharge Instructions: Mixed with lotion in clinic only. Use triamcinolone 15 (g) as directed Prim Dressing: KerraCel Ag Gelling Fiber Dressing, 4x5 in (silver alginate) (Home Health) 2 x Per Week/30 Days ary Discharge Instructions: Apply silver alginate to wound bed as instructed Secondary Dressing: Woven Gauze Sponge, Non-Sterile 4x4 in (Home Health) 2 x Per Week/30 Days Discharge Instructions: Apply over primary dressing as directed. Secondary Dressing: ABD Pad, 8x10 (Home Health) 2 x Per Week/30 Days Discharge Instructions: Apply over primary dressing as directed. Compression Wrap: FourPress (4 layer compression wrap) (Home Health) 2 x Per Week/30 Days Discharge Instructions: ***Apply to BOTH LEGS.***Apply four layer compression as directed. Wound #15 -  T Great oe Wound Laterality: Left Cleanser: Soap and Water [facility]) 2 x Per Day/15 Days Discharge Instructions: May shower and wash wound with dial antibacterial soap and water prior to dressing change. Topical: Mupirocin  Ointment 2 x Per Day/15 Days Discharge Instructions: ***In Clinic only***. Apply Mupirocin (Bactroban) under Alignate Ag. Topical: PolySporin (Home Health) 2 x Per Day/15 Days Discharge Instructions: patient to purchase and home health to apply under the alginate Ag, Prim Dressing: KerraCel Ag Gelling Fiber Dressing, 2x2 in (silver alginate) ([place]) 2 x Per Day/15 Days ary Discharge Instructions: Apply silver alginate to wound bed as instructed Secondary Dressing: Woven Gauze Sponges 2x2 in ([place]) 2 x Per Day/15 Days Discharge Instructions: Apply over primary dressing as directed. Secured With: Child psychotherapist, Sterile 2x75 (in/in) (Home Health) 2 x Per Day/15 Days Discharge Instructions: Secure with stretch gauze as directed. Electronic Signature(s) Signed: 11/13/2020 5:46:09 PM By: Linton Ham MD Signed: 11/13/2020 5:53:43 PM By: Deon Pilling Entered By: Deon Pilling on 11/13/2020 17:19:12 -------------------------------------------------------------------------------- Problem List Details Patient Name: Date of Service: [place] 11/13/2020 8:00 [place] Record Number: [id number] Patient Account Number: [id number] Date of Birth/Sex: Treating RN: 1940-10-16 (80 y.o. [name], [name] Primary Care Provider: [name]., [name] Other Clinician: Referring Provider: Treating Provider/Extender: [name], [name] in Treatment: 58 Active Problems ICD-10 Encounter Code Description Active Date MDM Diagnosis I87.323 Chronic venous hypertension (idiopathic) with inflammation of bilateral lower 10/11/2019 No Yes extremity I89.0 Lymphedema, not elsewhere classified 10/11/2019 No Yes L97.822 Non-pressure chronic ulcer of other part of left lower leg with fat layer exposed2/06/2020 No Yes L97.812 Non-pressure chronic ulcer of other part of right lower leg with fat layer 01/03/2020 No Yes exposed W23.76 Chronic systolic  (congestive) heart failure 10/02/2020 No Yes S90.812D Abrasion, left foot, subsequent encounter 10/09/2020 No Yes Inactive Problems ICD-10 Code Description Active Date Inactive Date L97.521 Non-pressure chronic ulcer of other part of left foot limited to breakdown of skin 10/11/2019 10/11/2019 S90.812D Abrasion, left foot, subsequent encounter 02/07/2020 02/07/2020 I42.9 Cardiomyopathy, unspecified 05/15/2020 05/15/2020 K92.1 [place] 05/15/2020 05/15/2020 Resolved Problems ICD-10 Code Description Active Date Resolved Date 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 10/11/2019 10/11/2019 Electronic Signature(s) Signed: 11/13/2020 5:46:09 PM By: Linton Ham MD Entered By: Linton Ham on 11/13/2020 09:42:22 -------------------------------------------------------------------------------- Progress Note Details Patient Name: Date of Service: [place] 11/13/2020 8:00 [place] Record Number: [id number] Patient Account Number: [id number] Date of Birth/Sex: Treating RN: [birth year] (80 y.o. [name], [name] Primary Care Provider: [name]., [name] Other Clinician: Referring Provider: Treating Provider/Extender: [name], [name] in Treatment: 38 Subjective History of Present Illness (HPI) ADMISSION 10/11/2019 This is a 80 year old man with a history of a severe cardiomyopathy with an ejection fraction of about 20%, chronic renal failure stage III. He is listed as a type II diabetic in epic although the patient denies this. He also has a history of PVD. He states for the last month he has had wounds on his bilateral lower extremities that started off as blisters which denuded. He has areas on the left lateral calf and 2 on the right lateral. He has an area on the left first met head which he did not know was there he we identified this on intake. He has been using Silvadene cream provided by his primary care physician but he  is complaining  that this burns. 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 He was acutely ill at that time complicating GI bleed. ABIs in our clinic were 1.16 on the right and 1.13 on the left 2/25; the patient comes in with his areas on the left lateral and right lateral calf. There is also an area over the left first MTP bunion deformity. We have been using Sorbact. His edema control is fairly good 3/4; left lateral and right lateral calf. Most of his wounds are in the same position tightly adherent nonviable debris. On the right we debrided the superior wound on the left both wounds. The area on his bunion over the left first MTP medially is I think just about closed. We have been using Sorbact without a lot of success changed to Iodoflex under compression 3/11; left lateral and right lateral calf perhaps minor improvement in the surface condition. We have been using Iodoflex. The area over the bunion of the left first MTP has closed over 3/18; left lateral and right lateral calf not much improvement. We have been using Iodoflex. Aggressive debridement last week. 3/25; left lateral and right lateral calf. We have been using Iodoflex. There is some improvement in the superior area on the right and 2 on the lateral left although there is still a lot of debris on the surface. The inferior area on the right is still a completely nonviable surface. 4/8; left lateral and right lateral calfs. Essentially mirror-image looking wounds 2 wounds on each side in close juxtaposition we have been using Iodoflex with some improvement in the very adherent  fibrinous debris but not a lot. The patient has arterial studies next Wednesday morning and venous studies next Thursday morning. I have been avoiding any further aggressive debridement until we see the arterial study results. His ABIs were fairly good in this clinic and is dorsalis pedis pulses are palpable but the wound beds are pale. 4/15; left lateral and right lateral calfs. Essentially mirror-image wounds with 2 wounds on each side in close juxtaposition but with rims of separating normal tissue. We have been using Iodoflex. The base of the wounds has been cleaning up quite nicely ARTERIAL STUDIES were done showing the patient had an ABI on the right of 1.27 with triphasic waveforms and a TBI of 0.90 on the left triphasic waveforms with an ABI of 1.28 and a TBI of 0.87. No evidence of arterial disease VENOUS REFLUX STUDIES; were done yesterday we do not have this report yet. 4/23; VENOUS REFLUX STUDIES did not show any significant reflux right lower extremity. No evidence of a DVT He did have significant reflux in the common . femoral vein on the left but nothing else was listed as significant. He did not have a DVT. We have been using Iodoflex under compression his wounds are making progress. 4/29; improvements in the wound surface continue. We have been using Iodoflex. He has a 20% out-of-pocket co-pay for Apligraf which is unfortunate. We changed him to Sorbact today 5/6; started on Sorbact last week. Better looking wound surface but not much change in dimensions. 01/24/20-Patient is back at 3 weeks, wound surfaces are about the same but very minimal slough  on the right leg wounds, however there is blistering on both legs adjacent to the wounds, patient denies any other symptoms or new symptoms. His studies have been reviewed and do not indicate any significant arterial or venous disease. But he is attending once in 3 weeks with home health changing in between 6/3; patient has 4 wounds 2  on each side of his lateral lower legs. These wounds are somewhat improved. He apparently arrived in clinic last week with a large blister on the right lateral lower leg that had denuded into the wound. They changed to silver alginate. Still under compression. He has straight Medicare and unfortunately has an unlimited co-pay for advanced treatment options. 6/10; his original 4 wounds are all better especially on the left lateral lower leg. 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 6/24; blister on the right lateral lower leg has healed. There is no additional wounds. The traumatic left second toe is also closed. The 4 that he has are all better 7/1; all of the patient's wounds are smaller except for the proximal one on the left lateral calf. We are using silver collagen 7/22; patient has home health. Relatively refractory wounds on the lateral part of both mid calfs. Each of them 2 wounds. The areas on the left are doing much better in fact the distal 1 looks like it is on his way to closure. The right required debridement with adherent fibrinous debris under illumination. We have been using silver collagen. Previous arterial studies were within normal limits he also had venous reflux studies 04/03/2020 upon evaluation today patient appears to be doing excellent with regard to his lower extremity ulcers. He is going to require a little bit of debridement on the wounds on the right especially in order to clear away some biofilm and slough but this is minimal and overall he seems to be doing quite well. 8/19; the patient's wounds on the left lateral lower leg just  above closed. However the 2 on the lateral part of the right have a nonviable necrotic surface. This is disappointing. There is also no change in dimensions. He has had arterial studies as well as venous reflux studies 8/26; very disappointing today. Even the more superficial areas on the left are about the same as last week. Still 2 punched-out areas completely unchanged on the right. We have been using silver collagen really making no progress. 9/2; changed to Iodoflex last week better looking wound surfaces especially on the right although they are deep and punched out. The areas on the left are more superficial open 1 of these is almost fully epithelialized although it has been this way for at least 2 weeks. He has a 20% co-pay [Medicare only] unaffordable for a skin substitute. Had some thought about a snap VAC on the deep areas on the right leg we will try to put this through in the insurance 9/9; punched-out wounds on the bilateral lateral lower extremities. We have been operating as if the these were chronic venous wounds with secondary lymphedema. The areas on the left have been doing well in fact one of them is closed over.  We have had no improvement in the areas on the right we have been using Iodoflex to help with surface debridement. The areas on the right are deeper wider. I do not see any evidence of infection. Home health is not been putting the wraps on high enough he has localized lymphedema right above the wounds. He has had arterial and venous studies already 9/16; punched-out mirror-image wounds on his bilateral lateral mid calfs. We have 1 closed on the left lateral the other smaller. For the first time today the areas on the right lateral actually looks some better. I did a biopsy of 1 of these last time although we still do not have this result. HOWEVER he arrives in clinic today complaining of chest pain overnight which felt like a brick on his chest. He also had 2 black watery  bowel movements. He does not have nausea or vomiting. He is not describing abdominal pain. He has no prior history of diarrhea heartburn etc. 9/30; sent this patient to the ER on 9/16. 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 He is on twice daily PPIs Protonix. His admission hemoglobin was exceptionally low at 5. 8.. Discharged at 8.1. 4 unit transfusion. 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 His diuretic dose was actually decreased because of elevated creatinine currently at torsemide 20 mg not felt to be a Coumadin candidate. Patient states he feels well. The areas on his left leg are healed. The areas on the right look better. These have been very refractory wounds. He does not have stockings we will make arrangements for this today 10/7; the areas on the left leg are healed we will transition him into his 20/30 stocking today. Continued improvement in the areas on the right lateral leg 10/21; the areas on the left leg remain healed at this 2-week follow-up however there is a history that he developed some drainage from one of the wound sites with home health therefore going ahead and wrapping him. He was supposed to be on 20/30 stockings although the history here is vague and I am not sure that this represents a stocking failure or not. We have been wrapping his right leg with the one remaining wound is the distal wound. We have been using silver collagen 10/28 the patient only has 1 wound remaining on the right lower calf. This still has 3 mm of depth which is unchanged but the surface area is smaller. The superior wound on the right lateral is closed. The 2 wounds on the left remain closed and he is using his compression stocking 11/4 1 remaining wound on the right lower calf. Under illumination not a viable surface we have been using silver collagen we had good effect on the other areas  but this 1 appears to be stalled. There is no open area on the left leg he is using his own stocking. 11/11; 1 remaining wound on the right lower calf. Surface of this is a lot better than last week but still requiring debridement I am using Iodoflex but looking forward to changing the primary dressing to perhaps endoform 11/18; 1 remaining wound on the right lower leg, venous insufficiency, vigorous debridement last week/Iodoflex. Depth of the wound is therefore larger but the wound looks clean. Still very gritty material at the surface requiring debridement 12/2; the remaining wound on his right lower leg, venous insufficiency. I changed him  to endoform 2 weeks ago. We appear to be making nice progress. Wound is measuring smaller 12/16; he comes in today with one of his two wounds on the left opened infected second area on the left may be threatened as well as the healed one on the right. He has edema in both legs which I think is pitting. He says his weights are stable he has unknown fairly severe cardiomyopathy. His cardiologist is Dr. Brigitte Pulse it does not sound like he is really been carefully followed 12/23; the areas that I was concerned about on the left look a lot better we put some silver alginate and put him back in compression. It may be the compression that actually does the trick here. The remaining open area we have been using Iodoflex and again this looks a lot better. At our suggestion he went to see his primary who did not adjust his diuretic he is still apparently taking 20 mg of a loop diuretic and "sometimes" 40 mg is basically what the patient stated 1/6; the area on the left is closed once again. We will transition him into his 20/30 stocking from elastic therapy both areas on the right are now open which is a deterioration from last time at which time the only 1 was open but they are very superficial. We have been using Hydrofera Blue under compression 1/27; its been 3 weeks since  the patient was here. We transitioned him to a 20/30 stocking on the left leg. Still under 4-layer compression on the right. He comes in today with a new small opening on the left leg but reopening on the right. Massive blistering on the right leg. He has severe systemic fluid volume overload. I have looked over the patient's records. He was seen in 2020 by tele health by cardiology. 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 I think he was supposed to have follow-up but that follow-up never seems to have happened. He does not complain of chest pain or shortness of breath but notes increasing edema not just in his lower legs 2/3; he has new open areas on the bilateral anterior lower legs. I think a lot of this is because of systemic fluid volume overload. The ultrasound I ordered showed ascites and suggestion of cirrhosis. His physical exam continues to suggest right greater than left congestive heart failure. I have him taking 40 mg of Lasix oral daily I have asked him to start weighing himself daily. With the intense efforts of our nursing staff we have him a cardiology appointment on Monday or Tuesday of next week I have told him not to miss this. With regards to his wounds I do not think we are going to be able to stop wrapping him until somebody manages to get his fluid volume down 2/10; the areas on his bilateral lateral lower legs actually look better today. He has been taking Lasix 40 mg a day I believe [2 x 20 mg]. He seems to have less edema in his legs and and is a result of this I think he has less edema in his lower legs as well and the wounds appear to be doing better. He comes in today with purulence coming out of the toenail bed of the left great toe it appears that his toenail came off there was noted to be pus I did a culture of this area 2/24; since the patient was last here 2 weeks ago he  was admitted to hospital from 2/12  through 2/16 predominantly acute on chronic systolic heart failure. He also has stage IV chronic kidney disease discharge creatinine at 2.71 and on top of this morphology of the liver on ultrasound suggested cirrhosis of unclear etiology. He had 9.4 L of ascites tapped I am unclear whether this was sent for analysis at the time of this dictation. He is currently taking 40 mg of Demadex a day I am a bit surprised that was so low. Nevertheless the wounds on his legs are a lot better everything is closed on the right he still has an open area on the left and then the area on the great toe nail matrix on the left from last time. 10/30/2020 upon evaluation today patient appears to be doing well with regard to his legs other than the fact that they are extremely swollen. He has multiple blisters noted bilateral lower extremities. Apparently the home health nurse came out yesterday unwrapped them and noticed a couple blisters she called 911 and would not rewrap him. There is no evidence of infection whatsoever we did not receive a phone call that we are aware of anyway regarding the fact that the home health nurse had any concerns at all. Nonetheless the major issue here is that the patient is going to really need to make sure that his fluids under good control but I do not see anything that indicates he needs to go to the hospital right now. There is literally no evidence of infection although fluid is just completely clear. 3/16; patient has home health. Apparently the last time he was here home health did not put compression wraps back on his legs after discovering blisters. By the time he got into see [place] he had had several additional blisters and wounds open up. He was put back in 4 layer compression most of this appears to be healing. He still has small superficial areas on his bilateral lower extremities He also has an area in the left great toe nailbed after lost his toe  nail. Objective Constitutional Vitals Time Taken: 8:12 AM, Height: 71 in, Weight: 198 lbs, BMI: 27.6, Temperature: 98.1 F, Pulse: 97 bpm, Respiratory Rate: 20 breaths/min, Blood Pressure: 108/66 mmHg. Cardiovascular His pedal pulses have always been robust. This is never been an issue. Excellent edema control bilaterally. General Notes: Wound exam; the patient has superficial areas bilaterally mostly in the same area as his initial wounds on the right lateral and left that lateral. The multiple blisters over his bilateral lower extremities that were noted last time most of these of either closed or fully epithelialized. ooThe area over his left first toenail bed is nice healthy red granulation tissue Integumentary (Hair, Skin) Wound #14 status is Open. Original cause of wound was Blister. The date acquired was: 10/02/2020. The wound has been in treatment 6 weeks. The wound is located on the Left,Lateral Lower Leg. The wound measures 0.4cm length x 0.2cm width x 0.2cm depth; 0.063cm^2 area and 0.013cm^3 volume. There is Fat Layer (Subcutaneous Tissue) exposed. There is no tunneling or undermining noted. There is a small amount of serosanguineous drainage noted. The wound margin is flat and intact. There is large (67-100%) red granulation within the wound bed. There is a small (1-33%) amount of necrotic tissue within the wound bed including Adherent Slough. Wound #15 status is Open. Original cause of wound was Gradually Appeared. The date acquired was: 10/09/2020. The wound has been in treatment 5 weeks. The wound is  located on the Left T Great. The wound measures 1cm length x 1.6cm width x 0.1cm depth; 1.257cm^2 area and 0.126cm^3 volume. There is oe Fat Layer (Subcutaneous Tissue) exposed. There is no tunneling or undermining noted. There is a medium amount of serosanguineous drainage noted. The wound margin is flat and intact. There is large (67-100%) pink granulation within the wound bed. There  is no necrotic tissue within the wound bed. Wound #16 status is Open. Original cause of wound was Gradually Appeared. The date acquired was: 11/13/2020. The wound is located on the Right,Lateral Lower Leg. The wound measures 0.7cm length x 2cm width x 0.1cm depth; 1.1cm^2 area and 0.11cm^3 volume. There is Fat Layer (Subcutaneous Tissue) exposed. There is no tunneling or undermining noted. There is a medium amount of serosanguineous drainage noted. The wound margin is flat and intact. There is large (67-100%) red granulation within the wound bed. There is no necrotic tissue within the wound bed. Assessment Active Problems ICD-10 Chronic venous hypertension (idiopathic) with inflammation of bilateral lower extremity Lymphedema, not elsewhere classified Non-pressure chronic ulcer of other part of left lower leg with fat layer exposed Non-pressure chronic ulcer of other part of right lower leg with fat layer exposed Chronic systolic (congestive) heart failure Abrasion, left foot, subsequent encounter Procedures Wound #14 Pre-procedure diagnosis of Wound #14 is a Venous Leg Ulcer located on the Left,Lateral Lower Leg . There was a Four Layer Compression Therapy Procedure by Baruch Gouty, RN. Post procedure Diagnosis Wound #14: Same as Pre-Procedure Wound #16 Pre-procedure diagnosis of Wound #16 is a Venous Leg Ulcer located on the Right,Lateral Lower Leg . There was a Four Layer Compression Therapy Procedure by Baruch Gouty, RN. Post procedure Diagnosis Wound #16: Same as Pre-Procedure Plan Follow-up Appointments: Return Appointment in 2 weeks. - Thursday Nurse Visit: - Thursday 11/07/2018. Bathing/ Shower/ Hygiene: May shower with protection but do not get wound dressing(s) wet. May shower and wash wound with soap and water. - with dressing changes only. Edema Control - Lymphedema / SCD / Other: Elevate legs to the level of the heart or above for 30 minutes daily and/or when sitting,  a frequency of: - 3-4 times a day throughout the day. Avoid standing for long periods of time. Exercise regularly Non Wound Condition: Other Non Wound Condition Orders/Instructions: - 4 layer compression to right leg apply lotion. wound center weekly and home health once a week. zetuvit or extrasorb padding over blister areas for absorption. Home Health: No change in wound care orders this week; continue Home Health for wound care. May utilize formulary equivalent dressing for wound treatment orders unless otherwise specified. - [facility] home health twice a week. WOUND #14: - Lower Leg Wound Laterality: Left, Lateral Cleanser: Soap and Water (Home Health) 2 x Per Week/30 Days Discharge Instructions: May shower and wash wound with dial antibacterial soap and water prior to dressing change. Peri-Wound Care: Sween Lotion (Moisturizing lotion) (Home Health) 2 x Per Week/30 Days Discharge Instructions: Apply moisturizing lotion as directed Peri-Wound Care: Triamcinolone 15 (g) 2 x Per Week/30 Days Discharge Instructions: Mixed with lotion in clinic only. Use triamcinolone 15 (g) as directed Prim Dressing: KerraCel Ag Gelling Fiber Dressing, 4x5 in (silver alginate) (Home Health) 2 x Per Week/30 Days ary Discharge Instructions: Apply silver alginate to wound bed as instructed Secondary Dressing: Woven Gauze Sponge, Non-Sterile 4x4 in (Home Health) 2 x Per Week/30 Days Discharge Instructions: Apply over primary dressing as directed. Secondary Dressing: ABD Pad, 8x10 (Home Health) 2 x  Per Week/30 Days Discharge Instructions: Apply over primary dressing as directed. Com pression Wrap: FourPress (4 layer compression wrap) (Home Health) 2 x Per Week/30 Days Discharge Instructions: ***Apply to BOTH LEGS.***Apply four layer compression as directed. WOUND #15: - T Great Wound Laterality: Left oe Cleanser: Soap and Water (Home Health) 2 x Per YYT/03 Days Discharge Instructions: May shower and wash wound  with dial antibacterial soap and water prior to dressing change. Topical: Mupirocin Ointment 2 x Per Day/15 Days Discharge Instructions: ***In Clinic only***. Apply Mupirocin (Bactroban) under Alignate Ag. Topical: PolySporin (Home Health) 2 x Per Day/15 Days Discharge Instructions: patient to purchase and home health to apply under the alginate Ag, Prim Dressing: KerraCel Ag Gelling Fiber Dressing, 2x2 in (silver alginate) ([place]) 2 x Per Day/15 Days ary Discharge Instructions: Apply silver alginate to wound bed as instructed Secondary Dressing: Woven Gauze Sponges 2x2 in ([employer]) 2 x Per Day/15 Days Discharge Instructions: Apply over primary dressing as directed. Secured With: Child psychotherapist, Sterile 2x75 (in/in) (Home Health) 2 x Per Day/15 Days Discharge Instructions: Secure with stretch gauze as directed. 1. I am continuing with silver alginate under 4-layer compression bilaterally. 2. I told the patient he to ED can never be without compression of some form. Although he was recently hospitalized for congestive heart failure and treated aggressively this is not the only etiology of the of edema in his lower extremities. Electronic Signature(s) Signed: 11/13/2020 5:46:09 PM By: Linton Ham MD Entered By: Linton Ham on 11/13/2020 09:45:50 -------------------------------------------------------------------------------- SuperBill Details Patient Name: Date of Service: [place] 11/13/2020 Medical Record Number: [id number] Patient Account Number: [id number] Date of Birth/Sex: Treating RN: 1940-09-02 (80 y.o. 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

## 2020-11-13 NOTE — Progress Notes (Signed)
RONNIE, DOO ([id number]) Visit Report for 11/13/2020 Arrival Information Details Patient Name: Date of Service: [place] 11/13/2020 8:00 [place] Record Number: [id number] Patient Account Number: [id number] Date of Birth/Sex: Treating RN: 01-18-1941 (80 y.o. [name] Elvia Aydin: [name]., [name] Other Clinician: Referring Yurika Pereda: Treating Ladana Chavero/Extender: [name], [name] in Treatment: 14 Visit Information History Since Last Visit Added or deleted any medications: No Patient Arrived: Walker Any new allergies or adverse reactions: No Arrival Time: 08:12 Had a fall or experienced change in No Accompanied By: self activities of daily living that may affect Transfer Assistance: None risk of falls: Patient Identification Verified: Yes Signs or symptoms of abuse/neglect since last visito No Secondary Verification Process Completed: Yes Hospitalized since last visit: No Patient Requires Transmission-Based Precautions: No Implantable device outside of the clinic excluding No Patient Has Alerts: Yes cellular tissue based products placed in the center Patient Alerts: Patient on Blood Thinner since last visit: Has Dressing in Place as Prescribed: Yes Pain Present Now: No Electronic Signature(s) Signed: 11/13/2020 12:58:14 PM By: Sandre Kitty Entered By: Sandre Kitty on 11/13/2020 08:12:26 -------------------------------------------------------------------------------- Compression Therapy Details Patient Name: Date of Service: [place] [facility] [place] 11/13/2020 8:00 [place] Record Number: [id number] Patient Account Number: [id number] Date of Birth/Sex: Treating RN: 06/03/1941 (80 y.o. [name] Britteney Ayotte: [name]., [name] Other Clinician: Referring Aishah Teffeteller: Treating Magdelyn Roebuck/Extender: [name], [name] in Treatment: 52 Compression Therapy Performed for  Wound Assessment: Wound #14 Left,Lateral Lower Leg Performed By: Clinician Baruch Gouty, RN Compression Type: Four Layer Post Procedure Diagnosis Same as Pre-procedure Electronic Signature(s) Signed: 11/13/2020 5:53:43 PM By: Deon Pilling Entered By: Deon Pilling on 11/13/2020 09:22:00 -------------------------------------------------------------------------------- Compression Therapy Details Patient Name: Date of Service: [name] 11/13/2020 8:00 [place] Record Number: [id number] Patient Account Number: [id number] Date of Birth/Sex: Treating RN: 1940/10/19 (80 y.o. [name] Jaleiyah Alas: [name]., [name] Other Clinician: Referring Karista Aispuro: Treating Alethea Terhaar/Extender: [name], [name] in Treatment: 41 Compression Therapy Performed for Wound Assessment: Wound #16 Right,Lateral Lower Leg Performed By: Clinician Baruch Gouty, RN Compression Type: Four Layer Post Procedure Diagnosis Same as Pre-procedure Electronic Signature(s) Signed: 11/13/2020 5:53:43 PM By: Deon Pilling Entered By: Deon Pilling on 11/13/2020 09:22:00 -------------------------------------------------------------------------------- Encounter Discharge Information Details Patient Name: Date of Service: [place] 11/13/2020 8:00 [place] Record Number: [id number] Patient Account Number: [id number] Date of Birth/Sex: Treating RN: [birth year] (80 y.o. [name] Primary Care Vaughan Garfinkle: [name]., [name] Other Clinician: Referring Azayla Polo: Treating Devell Parkerson/Extender: [name], [name] in Treatment: 84 Encounter Discharge Information Items Discharge Condition: Stable Ambulatory Status: Walker Discharge Destination: Home Transportation: Private Auto Accompanied By: self Schedule Follow-up Appointment: Yes Clinical Summary of Care: Patient Declined Electronic Signature(s) Signed: 11/13/2020  5:44:19 PM By: Baruch Gouty RN, BSN Entered By: Baruch Gouty on 11/13/2020 09:57:46 -------------------------------------------------------------------------------- Lower Extremity Assessment Details Patient Name: Date of Service: [place] 11/13/2020 8:00 [place] Record Number: [id number] Patient Account Number: [id number] Date of Birth/Sex: Treating RN: 10/24/1940 (80 y.o. [name] Primary Care Andromeda Poppen: [name]., [name] Other Clinician: Referring Khair Chasteen: Treating Kyrin Gratz/Extender: [name], [name] in Treatment: 20 Edema Assessment Assessed: [Left: No] [Right: No] Edema: [Left:  Yes] [Right: Yes] Calf Left: Right: Point of Measurement: 53 cm From Medial Instep 30.5 cm 31.6 cm Ankle Left: Right: Point of Measurement: 14 cm From Medial Instep 22.5 cm 22.3 cm Vascular Assessment Pulses: Dorsalis Pedis Palpable: [Left:Yes] [Right:No] Electronic Signature(s) Signed: 11/13/2020 5:44:19 PM By: Baruch Gouty RN, BSN Entered By: Baruch Gouty on 11/13/2020 08:41:56 -------------------------------------------------------------------------------- Multi Wound Chart Details Patient Name: Date of Service: [place] 11/13/2020 8:00 [place] Record Number: [id number] Patient Account Number: [id number] Date of Birth/Sex: Treating RN: Oct 28, 1940 (80 y.o. [name], [name] Primary Care Gilman Olazabal: [name]., [name] Other Clinician: Referring Via Rosado: Treating Mattheu Brodersen/Extender: [name], [name] in Treatment: 34 Vital Signs Height(in): 71 Pulse(bpm): 97 Weight(lbs): 198 Blood Pressure(mmHg): 108/66 Body Mass Index(BMI): 28 Temperature(F): 98.1 Respiratory Rate(breaths/min): 20 Photos: [14:No Photos Left, Lateral Lower Leg] [15:No Photos Left T Great oe] [16:No Photos Right, Lateral Lower Leg] Wound Location: [14:Blister] [15:Gradually Appeared] [16:Gradually  Appeared] Wounding Event: [14:Venous Leg Ulcer] [15:Infection - not elsewhere classified Venous Leg Ulcer] Primary Etiology: [14:Anemia, Congestive Heart Failure,] [15:Anemia, Congestive Heart Failure,] [16:Anemia, Congestive Heart Failure,] Comorbid History: [14:Hypertension, Peripheral Venous Disease, End Stage Renal Disease Disease, End Stage Renal Disease Disease, End Stage Renal Disease 10/02/2020] [15:Hypertension, Peripheral Venous 10/09/2020] [16:Hypertension, Peripheral Venous  11/13/2020] Date Acquired: [14:6] [15:5] [16:0] Weeks of Treatment: [14:Open] [15:Open] [16:Open] Wound Status: [14:0.4x0.2x0.2] [15:1x1.6x0.1] [16:0.7x2x0.1] Measurements L x W x D (cm) [14:0.063] [15:1.257] [16:1.1] A (cm) : rea [14:0.013] [15:0.126] [16:0.11] Volume (cm) : [14:93.30%] [15:20.00%] [16:0.00%] % Reduction in Area: [14:93.10%] [15:19.70%] [16:0.00%] % Reduction in Volume: [14:Full Thickness Without Exposed] [15:Full Thickness Without Exposed] [16:Full Thickness Without Exposed] Classification: [14:Support Structures Small] [15:Support Structures Medium] [16:Support Structures Medium] Exudate Amount: [14:Serosanguineous] [15:Serosanguineous] [16:Serosanguineous] Exudate Type: [14:red, brown] [15:red, brown] [16:red, brown] Exudate Color: [14:Flat and Intact] [15:Flat and Intact] [16:Flat and Intact] Wound Margin: [14:Large (67-100%)] [15:Large (67-100%)] [16:Large (67-100%)] Granulation Amount: [14:Red] [15:Pink] [16:Red] Granulation Quality: [14:Small (1-33%)] [15:None Present (0%)] [16:None Present (0%)] Necrotic Amount: [14:Fat Layer (Subcutaneous Tissue): Yes Fat Layer (Subcutaneous Tissue): Yes Fat Layer (Subcutaneous Tissue): Yes] Exposed Structures: [14:Fascia: No Tendon: No Muscle: No Joint: No Bone: No Small (1-33%)] [15:Fascia: No Tendon: No Muscle: No Joint: No Bone: No Small (1-33%)] [16:Fascia: No Tendon: No Muscle: No Joint: No Bone: No Medium (34-66%)] Epithelialization:  [14:Compression Therapy] [15:N/A] [16:Compression Therapy] Procedures Performed: Treatment Notes Electronic Signature(s) Signed: 11/13/2020 5:46:09 PM By: Linton Ham MD Signed: 11/13/2020 5:53:43 PM By: Deon Pilling Entered By: Linton Ham on 11/13/2020 09:42:28 -------------------------------------------------------------------------------- Multi-Disciplinary Care Plan Details Patient Name: Date of Service: [place] 11/13/2020 8:00 A M Medical Record Number: [id number] Patient Account Number: [id number] Date of Birth/Sex: Treating RN: May 01, 1941 (80 y.o. [name] Marilyn Wing: [name]., [name] Other Clinician: Referring Chong January: Treating Skilar Marcou/Extender: [name], [name] in Treatment: 11 Multidisciplinary Care Plan reviewed with physician Active Inactive Venous Leg Ulcer Nursing Diagnoses: Knowledge deficit related to disease process and management Potential for venous Insuffiency (use before diagnosis confirmed) Goals: Patient will maintain optimal edema control Date Initiated: 05/01/2020 Target Resolution Date: 11/21/2020 Goal Status: Active Interventions: Assess peripheral edema status every visit. Compression as ordered Provide education on venous insufficiency Notes: Electronic Signature(s) Signed: 11/13/2020 5:53:43 PM By: Deon Pilling Entered By: Deon Pilling on 11/13/2020 08:28:54 -------------------------------------------------------------------------------- Pain Assessment Details Patient Name: Date of Service: [name] 11/13/2020 8:00 [place] Record Number:  [id number] Patient Account Number: [id number] Date of Birth/Sex: Treating RN: 01/28/1941 (80 y.o. [name] Lloyd Ayo: [name]., [name] Other Clinician: Referring Kynadi Dragos: Treating Demaya Hardge/Extender: [name], [name] in Treatment: 79 Active Problems Location of Pain  Severity and Description of Pain Patient Has Paino No Site Locations Pain Management and Medication Current Pain Management: Electronic Signature(s) Signed: 11/13/2020 12:58:14 PM By: Sandre Kitty Signed: 11/13/2020 5:53:43 PM By: Deon Pilling Entered By: Sandre Kitty on 11/13/2020 08:12:55 -------------------------------------------------------------------------------- Patient/Caregiver Education Details Patient Name: Date of Service: [place] 3/17/2022andnbsp8:00 [place] Record Number: [id number] Patient Account Number: [id number] Date of Birth/Gender: Treating RN: [birth year] (80 y.o. [name] Physician: [name]., [name] Other Clinician: Referring Physician: Treating Physician/Extender: [name], [name] in Treatment: 50 Education Assessment Education Provided To: Patient Education Topics Provided Venous: Handouts: Managing Venous Disease and Related Ulcers Methods: Explain/Verbal Responses: Reinforcements needed Electronic Signature(s) Signed: 11/13/2020 5:53:43 PM By: Deon Pilling Entered By: Deon Pilling on 11/13/2020 08:29:04 -------------------------------------------------------------------------------- Wound Assessment Details Patient Name: Date of Service: [name] 11/13/2020 8:00 [place] Record Number: [id number] Patient Account Number: [id number] Date of Birth/Sex: Treating RN: July 30, 1941 (80 y.o. [name], [name] Primary Care Daxson Reffett: [name]., [name] Other Clinician: Referring Clanton Emanuelson: Treating Jahaziel Francois/Extender: [name], [name] in Treatment: 84 Wound Status Wound Number: 14 Primary Venous Leg Ulcer Etiology: Wound Location: Left, Lateral Lower Leg Wound Open Wounding Event: Blister Status: Date Acquired: 10/02/2020 Comorbid Anemia, Congestive Heart Failure, Hypertension, Peripheral Weeks Of Treatment: 6 History: Venous  Disease, End Stage Renal Disease Clustered Wound: No Photos Photo Uploaded By: Sandre Kitty on 11/13/2020 16:46:22 Wound Measurements Length: (cm) 0.4 Width: (cm) 0.2 Depth: (cm) 0.2 Area: (cm) 0.063 Volume: (cm) 0.013 % Reduction in Area: 93.3% % Reduction in Volume: 93.1% Epithelialization: Small (1-33%) Tunneling: No Undermining: No Wound Description Classification: Full Thickness Without Exposed Support Structures Wound Margin: Flat and Intact Exudate Amount: Small Exudate Type: Serosanguineous Exudate Color: red, brown Foul Odor After Cleansing: No Slough/Fibrino Yes Wound Bed Granulation Amount: Large (67-100%) Exposed Structure Granulation Quality: Red Fascia Exposed: No Necrotic Amount: Small (1-33%) Fat Layer (Subcutaneous Tissue) Exposed: Yes Necrotic Quality: Adherent Slough Tendon Exposed: No Muscle Exposed: No Joint Exposed: No Bone Exposed: No Treatment Notes Wound #14 (Lower Leg) Wound Laterality: Left, Lateral Cleanser Soap and Water Discharge Instruction: May shower and wash wound with dial antibacterial soap and water prior to dressing change. Peri-Wound Care Sween Lotion (Moisturizing lotion) Discharge Instruction: Apply moisturizing lotion as directed Triamcinolone 15 (g) Discharge Instruction: Mixed with lotion in clinic only. Use triamcinolone 15 (g) as directed Topical Primary Dressing KerraCel Ag Gelling Fiber Dressing, 4x5 in (silver alginate) Discharge Instruction: Apply silver alginate to wound bed as instructed Secondary Dressing Woven Gauze Sponge, Non-Sterile 4x4 in Discharge Instruction: Apply over primary dressing as directed. ABD Pad, 8x10 Discharge Instruction: Apply over primary dressing as directed. Secured With Compression Wrap FourPress (4 layer compression wrap) Discharge Instruction: ***Apply to BOTH LEGS.***Apply four layer compression as directed. Compression Stockings Add-Ons Electronic Signature(s) Signed:  11/13/2020 5:44:19 PM By: Baruch Gouty RN, BSN Signed: 11/13/2020 5:53:43 PM By: Deon Pilling Entered By: Baruch Gouty on 11/13/2020 08:42:28 -------------------------------------------------------------------------------- Wound Assessment Details Patient Name: Date of Service: [place] 11/13/2020 8:00 [place] Record Number: [id number] Patient Account Number: [id number] Date of  Birth/Sex: Treating RN: Dec 23, 1940 (81 y.o. [name], [name] Primary Care Bryony Kaman: [name]., [name] Other Clinician: Referring Jilliann Subramanian: Treating Kazimierz Springborn/Extender: [name], [name] in Treatment: 77 Wound Status Wound Number: 15 Primary Infection - not elsewhere classified Etiology: Wound Location: Left T Great oe Wound Open Wounding Event: Gradually Appeared Status: Date Acquired: 10/09/2020 Comorbid Anemia, Congestive Heart Failure, Hypertension, Peripheral Weeks Of Treatment: 5 History: Venous Disease, End Stage Renal Disease Clustered Wound: No Photos Photo Uploaded By: Sandre Kitty on 11/13/2020 16:46:22 Wound Measurements Length: (cm) 1 Width: (cm) 1.6 Depth: (cm) 0.1 Area: (cm) 1.257 Volume: (cm) 0.126 % Reduction in Area: 20% % Reduction in Volume: 19.7% Epithelialization: Small (1-33%) Tunneling: No Undermining: No Wound Description Classification: Full Thickness Without Exposed Support Structures Wound Margin: Flat and Intact Exudate Amount: Medium Exudate Type: Serosanguineous Exudate Color: red, brown Foul Odor After Cleansing: No Slough/Fibrino Yes Wound Bed Granulation Amount: Large (67-100%) Exposed Structure Granulation Quality: Pink Fascia Exposed: No Necrotic Amount: None Present (0%) Fat Layer (Subcutaneous Tissue) Exposed: Yes Tendon Exposed: No Muscle Exposed: No Joint Exposed: No Bone Exposed: No Treatment Notes Wound #15 (Toe Great) Wound Laterality: Left Cleanser Soap and Water Discharge Instruction:  May shower and wash wound with dial antibacterial soap and water prior to dressing change. Peri-Wound Care Topical Mupirocin Ointment Discharge Instruction: ***In Clinic only***. Apply Mupirocin (Bactroban) under Alignate Ag. PolySporin Discharge Instruction: patient to purchase and home health to apply under the alginate Ag, Primary Dressing KerraCel Ag Gelling Fiber Dressing, 2x2 in (silver alginate) Discharge Instruction: Apply silver alginate to wound bed as instructed Secondary Dressing Woven Gauze Sponges 2x2 in Discharge Instruction: Apply over primary dressing as directed. Secured With Conforming Stretch Gauze Bandage, Sterile 2x75 (in/in) Discharge Instruction: Secure with stretch gauze as directed. Compression Wrap Compression Stockings Add-Ons Electronic Signature(s) Signed: 11/13/2020 5:44:19 PM By: Baruch Gouty RN, BSN Signed: 11/13/2020 5:53:43 PM By: Deon Pilling Entered By: Baruch Gouty on 11/13/2020 08:43:00 -------------------------------------------------------------------------------- Wound Assessment Details Patient Name: Date of Service: [place] 11/13/2020 8:00 [place] Record Number: [id number] Patient Account Number: [id number] Date of Birth/Sex: Treating RN: [birth year] (80 y.o. [name], [name] Primary Care Alpheus Stiff: [name]., [name] Other Clinician: Referring Czarina Gingras: Treating Annaly Skop/Extender: [name], [name] in Treatment: 60 Wound Status Wound Number: 16 Primary Venous Leg Ulcer Etiology: Wound Location: Right, Lateral Lower Leg Wound Open Wounding Event: Gradually Appeared Status: Date Acquired: 11/13/2020 Comorbid Anemia, Congestive Heart Failure, Hypertension, Peripheral Weeks Of Treatment: 0 History: Venous Disease, End Stage Renal Disease Clustered Wound: No Photos Photo Uploaded By: Sandre Kitty on 11/13/2020 16:50:18 Wound Measurements Length: (cm) 0.7 Width: (cm) 2 Depth:  (cm) 0.1 Area: (cm) 1.1 Volume: (cm) 0.11 % Reduction in Area: 0% % Reduction in Volume: 0% Epithelialization: Medium (34-66%) Tunneling: No Undermining: No Wound Description Classification: Full Thickness Without Exposed Support Structu Wound Margin: Flat and Intact Exudate Amount: Medium Exudate Type: Serosanguineous Exudate Color: red, brown res Foul Odor After Cleansing: No Slough/Fibrino No Wound Bed Granulation Amount: Large (67-100%) Exposed Structure Granulation Quality: Red Fascia Exposed: No Necrotic Amount: None Present (0%) Fat Layer (Subcutaneous Tissue) Exposed: Yes Tendon Exposed: No Muscle Exposed: No Joint Exposed: No Bone Exposed: No Treatment Notes Wound #16 (Lower Leg) Wound Laterality: Right, Lateral Cleanser Peri-Wound Care Topical Primary Dressing Secondary Dressing Secured With Compression Wrap Compression Stockings Add-Ons Electronic Signature(s) Signed: 11/13/2020 5:44:19 PM By: Baruch Gouty RN, BSN Signed: 11/13/2020  5:53:43 PM By: Deon Pilling Entered By: Baruch Gouty on 11/13/2020 08:43:32 -------------------------------------------------------------------------------- Vitals Details Patient Name: Date of Service: [place] 11/13/2020 8:00 [place] Record Number: [id number] Patient Account Number: [id number] Date of Birth/Sex: Treating RN: 1940/12/15 (80 y.o. [name], [name] Primary Care Kolston Lacount: [name]., [name] Other Clinician: Referring Malaisha Silliman: Treating Larkyn Greenberger/Extender: [name], [name] in Treatment: 56 Vital Signs Time Taken: 08:12 Temperature (F): 98.1 Height (in): 71 Pulse (bpm): 97 Weight (lbs): 198 Respiratory Rate (breaths/min): 20 Body Mass Index (BMI): 27.6 Blood Pressure (mmHg): 108/66 Reference Range: 80 - 120 mg / dl Electronic Signature(s) Signed: 11/13/2020 12:58:14 PM By: Sandre Kitty Entered By: Sandre Kitty on 11/13/2020 08:12:46

## 2020-11-27 ENCOUNTER — Encounter (HOSPITAL_BASED_OUTPATIENT_CLINIC_OR_DEPARTMENT_OTHER): Payer: Medicare Other | Admitting: Internal Medicine

## 2020-11-27 ENCOUNTER — Other Ambulatory Visit: Payer: Self-pay

## 2020-11-27 DIAGNOSIS — I87323 Chronic venous hypertension (idiopathic) with inflammation of bilateral lower extremity: Secondary | ICD-10-CM | POA: Diagnosis not present

## 2020-11-27 NOTE — Progress Notes (Signed)
NIAL, HAWE ([id number]) Visit Report for 11/27/2020 HPI Details Patient Name: Date of Service: [place] 11/27/2020 8:00 [place] Record Number: [id number] Patient Account Number: [id number] Date of Birth/Sex: Treating RN: 08/08/1941 (80 y.o. [name], [name] Primary Care Provider: [name]., [name] Other Clinician: Referring Provider: Treating Provider/Extender: [name], [name] in Treatment: 27 History of Present Illness HPI Description: ADMISSION 10/11/2019 This is a 80 year old man with a history of a severe cardiomyopathy with an ejection fraction of about 20%, chronic renal failure stage III. He is listed as a type II diabetic in epic although the patient denies this. He also has a history of PVD. He states for the last month he has had wounds on his bilateral lower extremities that started off as blisters which denuded. He has areas on the left lateral calf and 2 on the right lateral. He has an area on the left first met head which he did not know was there he we identified this on intake. He has been using Silvadene cream provided by his primary care physician but he is complaining that this burns. 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 He was acutely ill at that time complicating GI bleed. ABIs in our clinic were 1.16 on the right and 1.13 on the left 2/25; the patient comes in with his areas on the left lateral and right lateral calf. There is also an area over the left first MTP bunion deformity. We have been using Sorbact. His edema control is fairly good 3/4; left lateral and  right lateral calf. Most of his wounds are in the same position tightly adherent nonviable debris. On the right we debrided the superior wound on the left both wounds. The area on his bunion over the left first MTP medially is I think just about closed. We have been using Sorbact without a lot of success changed to Iodoflex under compression 3/11; left lateral and right lateral calf perhaps minor improvement in the surface condition. We have been using Iodoflex. The area over the bunion of the left first MTP has closed over 3/18; left lateral and right lateral calf not much improvement. We have been using Iodoflex. Aggressive debridement last week. 3/25; left lateral and right lateral calf. We have been using Iodoflex. There is some improvement in the superior area on the right and 2 on the lateral left although there is still a lot of debris on the surface. The inferior area on the right is still a completely nonviable surface. 4/8; left lateral and right lateral calfs. Essentially mirror-image looking wounds 2 wounds on each side in close juxtaposition we have been using Iodoflex with some improvement in the very adherent fibrinous debris but not a lot. The patient has arterial studies next Wednesday morning and venous studies next Thursday morning. I have been avoiding any further aggressive debridement until we see the arterial study results. His ABIs were fairly good in this clinic and is dorsalis pedis pulses are palpable but the wound beds are pale. 4/15; left lateral and right lateral calfs. Essentially mirror-image wounds with 2 wounds on each side in close juxtaposition  but with rims of separating normal tissue. We have been using Iodoflex. The base of the wounds has been cleaning up quite nicely ARTERIAL STUDIES were done showing the patient had an ABI on the right of 1.27 with triphasic waveforms and a TBI of 0.90 on the left triphasic waveforms with an ABI of 1.28 and a TBI of 0.87. No  evidence of arterial disease VENOUS REFLUX STUDIES; were done yesterday we do not have this report yet. 4/23; VENOUS REFLUX STUDIES did not show any significant reflux right lower extremity. No evidence of a DVT He did have significant reflux in the common . femoral vein on the left but nothing else was listed as significant. He did not have a DVT. We have been using Iodoflex under compression his wounds are making progress. 4/29; improvements in the wound surface continue. We have been using Iodoflex. He has a 20% out-of-pocket co-pay for Apligraf which is unfortunate. We changed him to Sorbact today 5/6; started on Sorbact last week. Better looking wound surface but not much change in dimensions. 01/24/20-Patient is back at 3 weeks, wound surfaces are about the same but very minimal slough on the right leg wounds, however there is blistering on both legs adjacent to the wounds, patient denies any other symptoms or new symptoms. His studies have been reviewed and do not indicate any significant arterial or venous disease. But he is attending once in 3 weeks with home health changing in between 6/3; patient has 4 wounds 2 on each side of his lateral lower legs. These wounds are somewhat improved. He apparently arrived in clinic last week with a large blister on the right lateral lower leg that had denuded into the wound. They changed to silver alginate. Still under compression. He has straight Medicare and unfortunately has an unlimited co-pay for advanced treatment options. 6/10; his original 4 wounds are all better especially on the left lateral lower leg. 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 6/24; blister on the right lateral lower leg has healed. There is no additional wounds. The traumatic left second toe is also closed. The 4 that he has are all better 7/1; all of the patient's wounds are smaller except for the proximal one on the left lateral calf. We are using silver collagen 7/22; patient has home health. Relatively refractory wounds on the lateral part of both mid calfs. Each of them 2 wounds. The areas on the left are doing much better in fact the distal 1 looks like it is on his way to closure. The right required debridement with adherent fibrinous debris under illumination. We have been using silver collagen. Previous arterial studies were within normal limits he also had venous reflux studies 04/03/2020 upon evaluation today patient appears to be doing excellent with regard to his lower extremity ulcers. He is going to require a little bit of debridement on the wounds on the right especially in order to clear away some biofilm and slough but this is minimal and overall he seems to be doing quite well. 8/19; the patient's wounds on the left lateral lower leg just above closed. However the 2  on the lateral part of the right have a nonviable necrotic surface. This is disappointing. There is also no change in dimensions. He has had arterial studies as well as venous reflux studies 8/26; very disappointing today. Even the more superficial areas on the left are about the same as last week. Still 2 punched-out areas completely unchanged on the right. We have been using silver collagen really making no progress. 9/2; changed to Iodoflex last week better looking wound surfaces especially on the right although they are deep and punched out. The areas on the left are more superficial open 1 of these is almost fully epithelialized although it has been this way for at least 2 weeks. He has a 20% co-pay [Medicare only] unaffordable for a skin  substitute. Had some thought about a snap VAC on the deep areas on the right leg we will try to put this through in the insurance 9/9; punched-out wounds on the bilateral lateral lower extremities. We have been operating as if the these were chronic venous wounds with secondary lymphedema. The areas on the left have been doing well in fact one of them is closed over. We have had no improvement in the areas on the right we have been using Iodoflex to help with surface debridement. The areas on the right are deeper wider. I do not see any evidence of infection. Home health is not been putting the wraps on high enough he has localized lymphedema right above the wounds. He has had arterial and venous studies already 9/16; punched-out mirror-image wounds on his bilateral lateral mid calfs. We have 1 closed on the left lateral the other smaller. For the first time today the areas on the right lateral actually looks some better. I did a biopsy of 1 of these last time although we still do not have this result. HOWEVER he arrives in clinic today complaining of chest pain overnight which felt like a brick on his chest. He also had 2 black watery bowel movements. He does not have nausea or vomiting. He is not describing abdominal pain. He has no prior history of diarrhea heartburn etc. 9/30; sent this patient to the ER on 9/16. 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 He is on twice daily PPIs Protonix. His admission hemoglobin was exceptionally low at 5. 8.. Discharged at 8.1. 4 unit transfusion. 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 His diuretic dose was actually decreased because of elevated creatinine currently at torsemide 20 mg not felt to be a Coumadin candidate. Patient states he feels well. The areas on his left leg are healed. The areas on the right look better. These have been very refractory  wounds. He does not have stockings we will make arrangements for this today 10/7; the areas on the left leg are healed we will transition him into his 20/30 stocking today. Continued improvement in the areas on the right lateral leg 10/21; the areas on the left leg remain healed at this 2-week follow-up however there is a history that he developed some drainage from one of the wound sites with home health therefore going ahead and wrapping him. He was supposed to be on 20/30 stockings although the history here is vague and I am not sure that this represents a stocking failure or not. We have been wrapping his right leg with the one remaining wound is the distal wound. We  have been using silver collagen 10/28 the patient only has 1 wound remaining on the right lower calf. This still has 3 mm of depth which is unchanged but the surface area is smaller. The superior wound on the right lateral is closed. The 2 wounds on the left remain closed and he is using his compression stocking 11/4 1 remaining wound on the right lower calf. Under illumination not a viable surface we have been using silver collagen we had good effect on the other areas but this 1 appears to be stalled. There is no open area on the left leg he is using his own stocking. 11/11; 1 remaining wound on the right lower calf. Surface of this is a lot better than last week but still requiring debridement I am using Iodoflex but looking forward to changing the primary dressing to perhaps endoform 11/18; 1 remaining wound on the right lower leg, venous insufficiency, vigorous debridement last week/Iodoflex. Depth of the wound is therefore larger but the wound looks clean. Still very gritty material at the surface requiring debridement 12/2; the remaining wound on his right lower leg, venous insufficiency. I changed him to endoform 2 weeks ago. We appear to be making nice progress. Wound is measuring smaller 12/16; he comes in today with one of  his two wounds on the left opened infected second area on the left may be threatened as well as the healed one on the right. He has edema in both legs which I think is pitting. He says his weights are stable he has unknown fairly severe cardiomyopathy. His cardiologist is Dr. Brigitte Pulse it does not sound like he is really been carefully followed 12/23; the areas that I was concerned about on the left look a lot better we put some silver alginate and put him back in compression. It may be the compression that actually does the trick here. The remaining open area we have been using Iodoflex and again this looks a lot better. At our suggestion he went to see his primary who did not adjust his diuretic he is still apparently taking 20 mg of a loop diuretic and "sometimes" 40 mg is basically what the patient stated 1/6; the area on the left is closed once again. We will transition him into his 20/30 stocking from elastic therapy both areas on the right are now open which is a deterioration from last time at which time the only 1 was open but they are very superficial. We have been using Hydrofera Blue under compression 1/27; its been 3 weeks since the patient was here. We transitioned him to a 20/30 stocking on the left leg. Still under 4-layer compression on the right. He comes in today with a new small opening on the left leg but reopening on the right. Massive blistering on the right leg. He has severe systemic fluid volume overload. I have looked over the patient's records. He was seen in 2020 by tele health by cardiology. 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 I think he was supposed to have follow-up but that follow-up never seems to have happened. He does not complain of chest pain or shortness of breath but notes increasing edema not just in his lower legs 2/3; he has new open areas on the bilateral anterior lower legs. I think a lot of this is  because of systemic fluid volume overload. The ultrasound I ordered showed ascites and suggestion of cirrhosis. His physical exam  continues to suggest right greater than left congestive heart failure. I have him taking 40 mg of Lasix oral daily I have asked him to start weighing himself daily. With the intense efforts of our nursing staff we have him a cardiology appointment on Monday or Tuesday of next week I have told him not to miss this. With regards to his wounds I do not think we are going to be able to stop wrapping him until somebody manages to get his fluid volume down 2/10; the areas on his bilateral lateral lower legs actually look better today. He has been taking Lasix 40 mg a day I believe [2 x 20 mg]. He seems to have less edema in his legs and and is a result of this I think he has less edema in his lower legs as well and the wounds appear to be doing better. He comes in today with purulence coming out of the toenail bed of the left great toe it appears that his toenail came off there was noted to be pus I did a culture of this area 2/24; since the patient was last here 2 weeks ago he was admitted to hospital from 2/12 through 2/16 predominantly acute on chronic systolic heart failure. He also has stage IV chronic kidney disease discharge creatinine at 2.71 and on top of this morphology of the liver on ultrasound suggested cirrhosis of unclear etiology. He had 9.4 L of ascites tapped I am unclear whether this was sent for analysis at the time of this dictation. He is currently taking 40 mg of Demadex a day I am a bit surprised that was so low. Nevertheless the wounds on his legs are a lot better everything is closed on the right he still has an open area on the left and then the area on the great toe nail matrix on the left from last time. 10/30/2020 upon evaluation today patient appears to be doing well with regard to his legs other than the fact that they are extremely swollen. He has  multiple blisters noted bilateral lower extremities. Apparently the home health nurse came out yesterday unwrapped them and noticed a couple blisters she called 911 and would not rewrap him. There is no evidence of infection whatsoever we did not receive a phone call that we are aware of anyway regarding the fact that the home health nurse had any concerns at all. Nonetheless the major issue here is that the patient is going to really need to make sure that his fluids under good control but I do not see anything that indicates he needs to go to the hospital right now. There is literally no evidence of infection although fluid is just completely clear. 3/16; patient has home health. Apparently the last time he was here home health did not put compression wraps back on his legs after discovering blisters. By the time he got into see [place] he had had several additional blisters and wounds open up. He was put back in 4 layer compression most of this appears to be healing. He still has small superficial areas on his bilateral lower extremities He also has an area in the left great toe nailbed after lost his toe nail. 3/31; 2-week follow-up. Everything on the right leg is healed he has only has a very small open area remaining on the left leg in one of his original wound sites this should be healed by the next time he is here we have good edema control bilaterally. He  removed his toenail last time he was here. Drainage from this we are using topical antibiotics Finally he has increasing ascites which I think is probably cirrhosis rather than heart failure. He said he was going to go to the emergency room. I suggested that he go to his primary doctor get the primary doctor updated in where this is. If they are going to do paracentesis then that may be able to be done in interventional radiology rather than sitting for hours in the emergency room. He does not look to be in any this Electronic  Signature(s) Signed: 11/27/2020 5:43:02 PM By: Linton Ham MD Entered By: Linton Ham on 11/27/2020 08:27:47 -------------------------------------------------------------------------------- Physical Exam Details Patient Name: Date of Service: [place] 11/27/2020 8:00 [place] Record Number: [id number] Patient Account Number: [id number] Date of Birth/Sex: Treating RN: 01-04-1941 (80 y.o. [name], [name] Primary Care Provider: [name]., [name] Other Clinician: Referring Provider: Treating Provider/Extender: [name], [name] in Treatment: 77 Constitutional Sitting or standing Blood Pressure is within target range for patient.. Pulse regular and within target range for patient.Marland Kitchen Respirations regular, non-labored and within target range.. Temperature is normal and within the target range for the patient.Marland Kitchen Appears in no distress. Respiratory work of breathing is normal. Gastrointestinal (GI) Abdomen is increasingly distended. Notes Wound exam; the patient initially had superficial areas on the bilateral lateral lower extremities almost mirror-image wounds everything on the right is closed the blisters from last week are closed. On the left he still has 1 small open area on the original part of his wound area. This should be closed next time. Left great toenail bed looks clean healthy tissue. No evidence of infection Electronic Signature(s) Signed: 11/27/2020 5:43:02 PM By: Linton Ham MD Entered By: Linton Ham on 11/27/2020 40:98:11 -------------------------------------------------------------------------------- Physician Orders Details Patient Name: Date of Service: [place] 11/27/2020 8:00 [place] Number: [id number] Patient Account Number: [id number] Date of Birth/Sex: Treating RN: [birth year] (80 y.o. [name], [name] Primary Care Provider: [name]., [name] Other Clinician: Referring  Provider: Treating Provider/Extender: [name], [name] in Treatment: 40 Verbal / Phone Orders: No Diagnosis Coding Follow-up Appointments ppointment in 2 weeks. - Thursday Return A Bathing/ Shower/ Hygiene May shower with protection but do not get wound dressing(s) wet. May shower and wash wound with soap and water. - with dressing changes only. Edema Control - Lymphedema / SCD / Other Elevate legs to the level of the heart or above for 30 minutes daily and/or when sitting, a frequency of: - 3-4 times a day throughout the day. Avoid standing for long periods of time. Exercise regularly Moisturize legs daily. - right leg every night. Compression stocking or Garment 20-30 mm/Hg pressure to: - apply in the morning and remove at night to right leg only for now. Home Health No change in wound care orders this week; continue Home Health for wound care. May utilize formulary equivalent dressing for wound treatment orders unless otherwise specified. - [facility] home health twice a week. Wound Treatment Wound #14 - Lower Leg Wound Laterality: Left, Lateral Cleanser: Soap and Water (Home Health) 2 x Per Week/30 Days Discharge Instructions: May shower and wash wound with dial antibacterial soap and water prior to dressing change. Peri-Wound Care: Sween Lotion (Moisturizing lotion) (Home Health) 2 x Per Week/30 Days Discharge Instructions: Apply moisturizing lotion as directed Peri-Wound Care: Triamcinolone 15 (g) 2  x Per Week/30 Days Discharge Instructions: Mixed with lotion in clinic only. Use triamcinolone 15 (g) as directed Prim Dressing: KerraCel Ag Gelling Fiber Dressing, 4x5 in (silver alginate) (Home Health) 2 x Per Week/30 Days ary Discharge Instructions: Apply silver alginate to wound bed as instructed Secondary Dressing: Woven Gauze Sponge, Non-Sterile 4x4 in (Home Health) 2 x Per Week/30 Days Discharge Instructions: Apply over primary dressing as  directed. Secondary Dressing: ABD Pad, 8x10 (Home Health) 2 x Per Week/30 Days Discharge Instructions: Apply over primary dressing as directed. Compression Wrap: FourPress (4 layer compression wrap) (Home Health) 2 x Per Week/30 Days Discharge Instructions: ***Apply to BOTH LEGS.***Apply four layer compression as directed. Wound #15 - T Great oe Wound Laterality: Left Cleanser: Soap and Water (Home Health) 2 x Per Day/15 Days Discharge Instructions: May shower and wash wound with dial antibacterial soap and water prior to dressing change. Topical: Mupirocin Ointment 2 x Per Day/15 Days Discharge Instructions: ***In Clinic only***. Apply Mupirocin (Bactroban) under Alignate Ag. Topical: Polysporin (Home Health) 2 x Per Day/15 Days Discharge Instructions: patient to purchase and home health to apply under the alginate Ag, Prim Dressing: KerraCel Ag Gelling Fiber Dressing, 2x2 in (silver alginate) ([place]) 2 x Per Day/15 Days ary Discharge Instructions: Apply silver alginate to wound bed as instructed Secondary Dressing: Woven Gauze Sponges 2x2 in ([place]) 2 x Per Day/15 Days Discharge Instructions: Apply over primary dressing as directed. Secured With: Child psychotherapist, Sterile 2x75 (in/in) (Home Health) 2 x Per Day/15 Days Discharge Instructions: Secure with stretch gauze as directed. Electronic Signature(s) Signed: 11/27/2020 5:43:02 PM By: Linton Ham MD Signed: 11/27/2020 5:47:39 PM By: Deon Pilling Entered By: Deon Pilling on 11/27/2020 08:22:30 -------------------------------------------------------------------------------- Problem List Details Patient Name: Date of Service: [place] 11/27/2020 8:00 [place] Record Number: [id number] Patient Account Number: [id number] Date of Birth/Sex: Treating RN: 05-20-1941 (80 y.o. 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 10/11/2019 No Yes extremity I89.0 Lymphedema, not elsewhere classified 10/11/2019 No Yes L97.822 Non-pressure chronic ulcer of other part of left lower leg with fat layer exposed2/06/2020 No Yes L97.812 Non-pressure chronic ulcer of other part of right lower leg with fat layer 01/03/2020 No Yes exposed L89.37 Chronic systolic (congestive) heart failure 10/02/2020 No Yes S90.812D Abrasion, left foot, subsequent encounter 10/09/2020 No Yes Inactive Problems ICD-10 Code Description Active Date Inactive Date L97.521 Non-pressure chronic ulcer of other part of left foot limited to breakdown of skin 10/11/2019 10/11/2019 S90.812D Abrasion, left foot, subsequent encounter 02/07/2020 02/07/2020 I42.9 Cardiomyopathy, unspecified 05/15/2020 05/15/2020 K92.1 Melena 05/15/2020 05/15/2020 Resolved Problems ICD-10 Code Description Active Date Resolved Date 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 10/11/2019 10/11/2019 Electronic Signature(s) Signed: 11/27/2020 5:43:02 PM By: Linton Ham MD Entered By: Linton Ham on 11/27/2020 34:28:76 -------------------------------------------------------------------------------- Progress Note Details Patient Name: Date of Service: [place] 11/27/2020 8:00 [place] Record Number: [id number] Patient Account Number: [id number] Date of Birth/Sex: Treating RN: 14-Mar-1941 (80 y.o. [name], [name] Primary Care Provider: [name]., [name] Other Clinician: Referring Provider: Treating Provider/Extender: [name], [name] in Treatment: 57 Subjective History of Present Illness (HPI) ADMISSION 10/11/2019 This is a 80 year old  man with a history of a severe  cardiomyopathy with an ejection fraction of about 20%, chronic renal failure stage III. He is listed as a type II diabetic in epic although the patient denies this. He also has a history of PVD. He states for the last month he has had wounds on his bilateral lower extremities that started off as blisters which denuded. He has areas on the left lateral calf and 2 on the right lateral. He has an area on the left first met head which he did not know was there he we identified this on intake. He has been using Silvadene cream provided by his primary care physician but he is complaining that this burns. 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 He was acutely ill at that time complicating GI bleed. ABIs in our clinic were 1.16 on the right and 1.13 on the left 2/25; the patient comes in with his areas on the left lateral and right lateral calf. There is also an area over the left first MTP bunion deformity. We have been using Sorbact. His edema control is fairly good 3/4; left lateral and right lateral calf. Most of his wounds are in the same position tightly adherent nonviable debris. On the right we debrided the superior wound on the left both wounds. The area on his bunion over the left first MTP medially is I think just about closed. We have been using Sorbact without a lot of success changed to Iodoflex under compression 3/11; left lateral and right lateral calf perhaps minor improvement in the surface condition. We have been using Iodoflex. The area over the bunion of the left first MTP has closed over 3/18; left lateral and right lateral calf  not much improvement. We have been using Iodoflex. Aggressive debridement last week. 3/25; left lateral and right lateral calf. We have been using Iodoflex. There is some improvement in the superior area on the right and 2 on the lateral left although there is still a lot of debris on the surface. The inferior area on the right is still a completely nonviable surface. 4/8; left lateral and right lateral calfs. Essentially mirror-image looking wounds 2 wounds on each side in close juxtaposition we have been using Iodoflex with some improvement in the very adherent fibrinous debris but not a lot. The patient has arterial studies next Wednesday morning and venous studies next Thursday morning. I have been avoiding any further aggressive debridement until we see the arterial study results. His ABIs were fairly good in this clinic and is dorsalis pedis pulses are palpable but the wound beds are pale. 4/15; left lateral and right lateral calfs. Essentially mirror-image wounds with 2 wounds on each side in close juxtaposition but with rims of separating normal tissue. We have been using Iodoflex. The base of the wounds has been cleaning up quite nicely ARTERIAL STUDIES were done showing the patient had an ABI on the right of 1.27 with triphasic waveforms and a TBI of 0.90 on the left triphasic waveforms with an ABI of 1.28 and a TBI of 0.87. No evidence of arterial disease VENOUS REFLUX STUDIES; were done yesterday we do not have this report yet.  4/23; VENOUS REFLUX STUDIES did not show any significant reflux right lower extremity. No evidence of a DVT He did have significant reflux in the common . femoral vein on the left but nothing else was listed as significant. He did not have a DVT. We have been using Iodoflex under compression his wounds are making progress. 4/29; improvements in the wound surface continue. We have been using Iodoflex. He has a 20% out-of-pocket co-pay for Apligraf which is  unfortunate. We changed him to Sorbact today 5/6; started on Sorbact last week. Better looking wound surface but not much change in dimensions. 01/24/20-Patient is back at 3 weeks, wound surfaces are about the same but very minimal slough on the right leg wounds, however there is blistering on both legs adjacent to the wounds, patient denies any other symptoms or new symptoms. His studies have been reviewed and do not indicate any significant arterial or venous disease. But he is attending once in 3 weeks with home health changing in between 6/3; patient has 4 wounds 2 on each side of his lateral lower legs. These wounds are somewhat improved. He apparently arrived in clinic last week with a large blister on the right lateral lower leg that had denuded into the wound. They changed to silver alginate. Still under compression. He has straight Medicare and unfortunately has an unlimited co-pay for advanced treatment options. 6/10; his original 4 wounds are all better especially on the left lateral lower leg. 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 6/24; blister on the right lateral lower leg has healed. There is no additional wounds. The traumatic left second toe is also closed. The 4 that he has are all better 7/1; all of the patient's wounds are smaller except for the proximal one on the left lateral calf. We are using silver collagen 7/22; patient has home health. Relatively refractory wounds on the lateral part of both mid calfs. Each of them 2 wounds. The areas on the left are doing much better in fact the distal 1 looks like it is on his way to closure. The  right required debridement with adherent fibrinous debris under illumination. We have been using silver collagen. Previous arterial studies were within normal limits he also had venous reflux studies 04/03/2020 upon evaluation today patient appears to be doing excellent with regard to his lower extremity ulcers. He is going to require a little bit of debridement on the wounds on the right especially in order to clear away some biofilm and slough but this is minimal and overall he seems to be doing quite well. 8/19; the patient's wounds on the left lateral lower leg just above closed. However the 2 on the lateral part of the right have a nonviable necrotic surface. This is disappointing. There is also no change in dimensions. He has had arterial studies as well as venous reflux studies 8/26; very disappointing today. Even the more superficial areas on the left are about the same as last week. Still 2 punched-out areas completely unchanged on the right. We have been using silver collagen really making no progress. 9/2; changed to Iodoflex last week better looking  wound surfaces especially on the right although they are deep and punched out. The areas on the left are more superficial open 1 of these is almost fully epithelialized although it has been this way for at least 2 weeks. He has a 20% co-pay [Medicare only] unaffordable for a skin substitute. Had some thought about a snap VAC on the deep areas on the right leg we will try to put this through in the insurance 9/9; punched-out wounds on the bilateral lateral lower extremities. We have been operating as if the these were chronic venous wounds with secondary lymphedema. The areas on the left have been doing well in fact one of them is closed over. We have had no improvement in the areas on the right we have been using Iodoflex to help with surface debridement. The areas on the right are deeper wider. I do not see any evidence of infection. Home health  is not been putting the wraps on high enough he has localized lymphedema right above the wounds. He has had arterial and venous studies already 9/16; punched-out mirror-image wounds on his bilateral lateral mid calfs. We have 1 closed on the left lateral the other smaller. For the first time today the areas on the right lateral actually looks some better. I did a biopsy of 1 of these last time although we still do not have this result. HOWEVER he arrives in clinic today complaining of chest pain overnight which felt like a brick on his chest. He also had 2 black watery bowel movements. He does not have nausea or vomiting. He is not describing abdominal pain. He has no prior history of diarrhea heartburn etc. 9/30; sent this patient to the ER on 9/16. 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 He is on twice daily PPIs Protonix. His admission hemoglobin was exceptionally low at 5. 8.. Discharged at 8.1. 4 unit transfusion. 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 His diuretic dose was actually decreased because of elevated creatinine currently at torsemide 20 mg not felt to be a Coumadin candidate. Patient states he feels well. The areas on his left leg are healed. The areas on the right look better. These have been very refractory wounds. He does not have stockings we will make arrangements for this today 10/7; the areas on the left leg are healed we will transition him into his 20/30 stocking today. Continued improvement in the areas on the right lateral leg 10/21; the areas on the left leg remain healed at this 2-week follow-up however there is a history that he developed some drainage from one of the wound sites with home health therefore going ahead and wrapping him. He was supposed to be on 20/30 stockings although the history here is vague and I am not sure that this represents a stocking  failure or not. We have been wrapping his right leg with the one remaining wound is the distal wound. We have been using silver collagen 10/28 the patient only has 1 wound remaining on the right lower calf. This still has 3 mm of depth which is unchanged but the surface area is smaller. The superior wound on the right lateral is closed. The 2 wounds on the left remain closed and he is using his compression stocking 11/4 1 remaining wound on the right lower calf. Under illumination not a viable surface we have been using silver collagen  we had good effect on the other areas but this 1 appears to be stalled. There is no open area on the left leg he is using his own stocking. 11/11; 1 remaining wound on the right lower calf. Surface of this is a lot better than last week but still requiring debridement I am using Iodoflex but looking forward to changing the primary dressing to perhaps endoform 11/18; 1 remaining wound on the right lower leg, venous insufficiency, vigorous debridement last week/Iodoflex. Depth of the wound is therefore larger but the wound looks clean. Still very gritty material at the surface requiring debridement 12/2; the remaining wound on his right lower leg, venous insufficiency. I changed him to endoform 2 weeks ago. We appear to be making nice progress. Wound is measuring smaller 12/16; he comes in today with one of his two wounds on the left opened infected second area on the left may be threatened as well as the healed one on the right. He has edema in both legs which I think is pitting. He says his weights are stable he has unknown fairly severe cardiomyopathy. His cardiologist is Dr. Brigitte Pulse it does not sound like he is really been carefully followed 12/23; the areas that I was concerned about on the left look a lot better we put some silver alginate and put him back in compression. It may be the compression that actually does the trick here. The remaining open area we have been  using Iodoflex and again this looks a lot better. At our suggestion he went to see his primary who did not adjust his diuretic he is still apparently taking 20 mg of a loop diuretic and "sometimes" 40 mg is basically what the patient stated 1/6; the area on the left is closed once again. We will transition him into his 20/30 stocking from elastic therapy both areas on the right are now open which is a deterioration from last time at which time the only 1 was open but they are very superficial. We have been using Hydrofera Blue under compression 1/27; its been 3 weeks since the patient was here. We transitioned him to a 20/30 stocking on the left leg. Still under 4-layer compression on the right. He comes in today with a new small opening on the left leg but reopening on the right. Massive blistering on the right leg. He has severe systemic fluid volume overload. I have looked over the patient's records. He was seen in 2020 by tele health by cardiology. 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 I think he was supposed to have follow-up but that follow-up never seems to have happened. He does not complain of chest pain or shortness of breath but notes increasing edema not just in his lower legs 2/3; he has new open areas on the bilateral anterior lower legs. I think a lot of this is because of systemic fluid volume overload. The ultrasound I ordered showed ascites and suggestion of cirrhosis. His physical exam continues to suggest right greater than left congestive heart failure. I have him taking 40 mg of Lasix oral daily I have asked him to start weighing himself daily. With the intense efforts of our nursing staff we have him a cardiology appointment on Monday or Tuesday of next week I have told him not to miss this. With regards to his wounds I do not think we are going to be able to stop wrapping him until somebody manages  to get his fluid  volume down 2/10; the areas on his bilateral lateral lower legs actually look better today. He has been taking Lasix 40 mg a day I believe [2 x 20 mg]. He seems to have less edema in his legs and and is a result of this I think he has less edema in his lower legs as well and the wounds appear to be doing better. He comes in today with purulence coming out of the toenail bed of the left great toe it appears that his toenail came off there was noted to be pus I did a culture of this area 2/24; since the patient was last here 2 weeks ago he was admitted to hospital from 2/12 through 2/16 predominantly acute on chronic systolic heart failure. He also has stage IV chronic kidney disease discharge creatinine at 2.71 and on top of this morphology of the liver on ultrasound suggested cirrhosis of unclear etiology. He had 9.4 L of ascites tapped I am unclear whether this was sent for analysis at the time of this dictation. He is currently taking 40 mg of Demadex a day I am a bit surprised that was so low. Nevertheless the wounds on his legs are a lot better everything is closed on the right he still has an open area on the left and then the area on the great toe nail matrix on the left from last time. 10/30/2020 upon evaluation today patient appears to be doing well with regard to his legs other than the fact that they are extremely swollen. He has multiple blisters noted bilateral lower extremities. Apparently the home health nurse came out yesterday unwrapped them and noticed a couple blisters she called 911 and would not rewrap him. There is no evidence of infection whatsoever we did not receive a phone call that we are aware of anyway regarding the fact that the home health nurse had any concerns at all. Nonetheless the major issue here is that the patient is going to really need to make sure that his fluids under good control but I do not see anything that indicates he needs to go to the hospital right now.  There is literally no evidence of infection although fluid is just completely clear. 3/16; patient has home health. Apparently the last time he was here home health did not put compression wraps back on his legs after discovering blisters. By the time he got into see [place] he had had several additional blisters and wounds open up. He was put back in 4 layer compression most of this appears to be healing. He still has small superficial areas on his bilateral lower extremities He also has an area in the left great toe nailbed after lost his toe nail. 3/31; 2-week follow-up. Everything on the right leg is healed he has only has a very small open area remaining on the left leg in one of his original wound sites this should be healed by the next time he is here we have good edema control bilaterally. He removed his toenail last time he was here. Drainage from this we are using topical antibiotics Finally he has increasing ascites which I think is probably cirrhosis rather than heart failure. He said he was going to go to the emergency room. I suggested that he go to his primary doctor get the primary doctor updated in where this is. If they are going to do paracentesis then that may be able to be done in interventional radiology rather  than sitting for hours in the emergency room. He does not look to be in any this Objective Constitutional Sitting or standing Blood Pressure is within target range for patient.. Pulse regular and within target range for patient.Marland Kitchen Respirations regular, non-labored and within target range.. Temperature is normal and within the target range for the patient.Marland Kitchen Appears in no distress. Vitals Time Taken: 8:05 AM, Height: 71 in, Source: Stated, Weight: 198 lbs, Source: Stated, BMI: 27.6, Temperature: 97.7 F, Pulse: 111 bpm, Respiratory Rate: 20 breaths/min, Blood Pressure: 121/77 mmHg. Respiratory work of breathing is normal. Gastrointestinal (GI) Abdomen is increasingly  distended. General Notes: Wound exam; the patient initially had superficial areas on the bilateral lateral lower extremities almost mirror-image wounds everything on the right is closed the blisters from last week are closed. On the left he still has 1 small open area on the original part of his wound area. This should be closed next time. ooLeft great toenail bed looks clean healthy tissue. No evidence of infection Integumentary (Hair, Skin) Wound #14 status is Open. Original cause of wound was Blister. The date acquired was: 10/02/2020. The wound has been in treatment 8 weeks. The wound is located on the Left,Lateral Lower Leg. The wound measures 0.1cm length x 0.1cm width x 0.1cm depth; 0.008cm^2 area and 0.001cm^3 volume. The wound is limited to skin breakdown. There is no tunneling or undermining noted. There is a none present amount of drainage noted. The wound margin is flat and intact. There is small (1-33%) red granulation within the wound bed. There is no necrotic tissue within the wound bed. Wound #15 status is Open. Original cause of wound was Gradually Appeared. The date acquired was: 10/09/2020. The wound has been in treatment 7 weeks. The wound is located on the Left T Great. The wound measures 0.5cm length x 1.2cm width x 0.1cm depth; 0.471cm^2 area and 0.047cm^3 volume. There is oe Fat Layer (Subcutaneous Tissue) exposed. There is no tunneling or undermining noted. There is a small amount of purulent drainage noted. The wound margin is flat and intact. There is medium (34-66%) pink granulation within the wound bed. There is a medium (34-66%) amount of necrotic tissue within the wound bed including Adherent Slough. Wound #16 status is Healed - Epithelialized. Original cause of wound was Gradually Appeared. The date acquired was: 11/13/2020. The wound has been in treatment 2 weeks. The wound is located on the Right,Lateral Lower Leg. The wound measures 0cm length x 0cm width x 0cm depth;  0cm^2 area and 0cm^3 volume. There is no tunneling or undermining noted. There is a none present amount of drainage noted. There is no granulation within the wound bed. There is no necrotic tissue within the wound bed. Assessment Active Problems ICD-10 Chronic venous hypertension (idiopathic) with inflammation of bilateral lower extremity Lymphedema, not elsewhere classified Non-pressure chronic ulcer of other part of left lower leg with fat layer exposed Non-pressure chronic ulcer of other part of right lower leg with fat layer exposed Chronic systolic (congestive) heart failure Abrasion, left foot, subsequent encounter Procedures Wound #14 Pre-procedure diagnosis of Wound #14 is a Venous Leg Ulcer located on the Left,Lateral Lower Leg . There was a Four Layer Compression Therapy Procedure by Shawn Stall, RN. Post procedure Diagnosis Wound #14: Same as Pre-Procedure Plan Follow-up Appointments: Return Appointment in 2 weeks. - Thursday Bathing/ Shower/ Hygiene: May shower with protection but do not get wound dressing(s) wet. May shower and wash wound with soap and water. - with dressing changes only.  Edema Control - Lymphedema / SCD / Other: Elevate legs to the level of the heart or above for 30 minutes daily and/or when sitting, a frequency of: - 3-4 times a day throughout the day. Avoid standing for long periods of time. Exercise regularly Moisturize legs daily. - right leg every night. Compression stocking or Garment 20-30 mm/Hg pressure to: - apply in the morning and remove at night to right leg only for now. Home Health: No change in wound care orders this week; continue Home Health for wound care. May utilize formulary equivalent dressing for wound treatment orders unless otherwise specified. - [facility] home health twice a week. WOUND #14: - Lower Leg Wound Laterality: Left, Lateral Cleanser: Soap and Water (Home Health) 2 x Per Week/30 Days Discharge Instructions: May  shower and wash wound with dial antibacterial soap and water prior to dressing change. Peri-Wound Care: Sween Lotion (Moisturizing lotion) (Home Health) 2 x Per Week/30 Days Discharge Instructions: Apply moisturizing lotion as directed Peri-Wound Care: Triamcinolone 15 (g) 2 x Per Week/30 Days Discharge Instructions: Mixed with lotion in clinic only. Use triamcinolone 15 (g) as directed Prim Dressing: KerraCel Ag Gelling Fiber Dressing, 4x5 in (silver alginate) (Home Health) 2 x Per Week/30 Days ary Discharge Instructions: Apply silver alginate to wound bed as instructed Secondary Dressing: Woven Gauze Sponge, Non-Sterile 4x4 in (Home Health) 2 x Per Week/30 Days Discharge Instructions: Apply over primary dressing as directed. Secondary Dressing: ABD Pad, 8x10 (Home Health) 2 x Per Week/30 Days Discharge Instructions: Apply over primary dressing as directed. Com pression Wrap: FourPress (4 layer compression wrap) (Home Health) 2 x Per Week/30 Days Discharge Instructions: ***Apply to BOTH LEGS.***Apply four layer compression as directed. WOUND #15: - T Great Wound Laterality: Left oe Cleanser: Soap and Water (Home Health) 2 x Per ZTI/45 Days Discharge Instructions: May shower and wash wound with dial antibacterial soap and water prior to dressing change. Topical: Mupirocin Ointment 2 x Per Day/15 Days Discharge Instructions: ***In Clinic only***. Apply Mupirocin (Bactroban) under Alignate Ag. Topical: Polysporin (Home Health) 2 x Per Day/15 Days Discharge Instructions: patient to purchase and home health to apply under the alginate Ag, Prim Dressing: KerraCel Ag Gelling Fiber Dressing, 2x2 in (silver alginate) ([place]) 2 x Per Day/15 Days ary Discharge Instructions: Apply silver alginate to wound bed as instructed Secondary Dressing: Woven Gauze Sponges 2x2 in ([place]) 2 x Per Day/15 Days Discharge Instructions: Apply over primary dressing as directed. Secured With: Hotel manager, Sterile 2x75 (in/in) (Home Health) 2 x Per Day/15 Days Discharge Instructions: Secure with stretch gauze as directed. 1. Silver alginate and 4-layer compression on the left lateral lower leg 2. The patient has a 20/30 below-knee stocking but he appears to have lost the other stocking. We are going to put this on the right leg 3. He will need a new pair of 20/30 below-knee stockings. Truthfully I am not exactly certain what compression he would require. He may require 30/40s but in any case he cannot afford juxta lites or anything similar 4. I have asked him to call his family doctor to involve that clinician about the status of the ascites. He also already had a paracentesis in the hospital. I am not sure whether they sent this fluid for analysis or not. Electronic Signature(s) Signed: 11/27/2020 5:43:02 PM By: Linton Ham MD Entered By: Linton Ham on 11/27/2020 08:34:21 -------------------------------------------------------------------------------- SuperBill Details Patient Name: Date of Service: [place], [place] 11/27/2020 Medical Record Number:  [id number] Patient Account Number: [id number] Date of Birth/Sex: Treating RN: [birth year] (80 y.o. 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 V95.63 Chronic systolic (congestive) heart failure S90.812D Abrasion, left foot, subsequent encounter Facility Procedures The patient participates with Medicare or  their insurance follows the Medicare Facility Guidelines: CPT4 Code Description Modifier Quantity [phone] (Facility Use Only) [phone] - Raoul 1 Physician Procedures : CPT4 Code Description Modifier [id number] - WC PHYS LEVEL 3 - EST PT ICD-10 Diagnosis Description I87.323 Chronic venous hypertension (idiopathic) with inflammation of bilateral lower extremity I89.0 Lymphedema, not elsewhere classified L97.822  Non-pressure chronic ulcer of other part of left lower leg with fat layer exposed S01.09 Chronic systolic (congestive) heart failure Quantity: 1 Electronic Signature(s) Signed: 11/27/2020 5:43:02 PM By: Linton Ham MD Entered By: Linton Ham on 11/27/2020 08:34:45

## 2020-11-27 NOTE — Progress Notes (Signed)
BERNERD, TERHUNE ([id number]) Visit Report for 11/27/2020 Arrival Information Details Patient Name: Date of Service: [place], [place] 11/27/2020 8:00 [place] Record Number: [id number] Patient Account Number: [id number] Date of Birth/Sex: Treating RN: 1941/06/17 (80 y.o. [name] Primary Care Jenevie Casstevens: [name]., [name] Other Clinician: Referring Terik Haughey: Treating Shannelle Alguire/Extender: [name], [name] in Treatment: 10 Visit Information History Since Last Visit Added or deleted any medications: No Patient Arrived: [place] Any new allergies or adverse reactions: No Arrival Time: 07:55 Had a fall or experienced change in No Accompanied By: self activities of daily living that may affect Transfer Assistance: None risk of falls: Patient Identification Verified: Yes Signs or symptoms of abuse/neglect since last visito No Secondary Verification Process Completed: Yes Hospitalized since last visit: No Patient Requires Transmission-Based Precautions: No Implantable device outside of the clinic excluding No Patient Has Alerts: Yes cellular tissue based products placed in the center Patient Alerts: Patient on Blood Thinner since last visit: Has Dressing in Place as Prescribed: Yes Has Compression in Place as Prescribed: Yes Pain Present Now: No Electronic Signature(s) Signed: 11/27/2020 5:43:47 PM By: Baruch Gouty RN, BSN Entered By: Baruch Gouty on 11/27/2020 07:56:47 -------------------------------------------------------------------------------- Compression Therapy Details Patient Name: Date of Service: [place] 11/27/2020 8:00 [place] Record Number: [id number] Patient Account Number: [id number] Date of Birth/Sex: Treating RN: 01-Oct-1940 (80 y.o. [name] Cheyna Retana: [name]., [name] Other Clinician: Referring Dontrez Pettis: Treating Makylee Sanborn/Extender: [name], [name] in  Treatment: 1 Compression Therapy Performed for Wound Assessment: Wound #14 Left,Lateral Lower Leg Performed By: Clinician Deon Pilling, RN Compression Type: Four Layer Post Procedure Diagnosis Same as Pre-procedure Electronic Signature(s) Signed: 11/27/2020 5:47:39 PM By: Deon Pilling Entered By: Deon Pilling on 11/27/2020 08:23:25 -------------------------------------------------------------------------------- Encounter Discharge Information Details Patient Name: Date of Service: [place] 11/27/2020 8:00 [place] Record Number: [id number] Patient Account Number: [id number] Date of Birth/Sex: Treating RN: September 11, 1940 (80 y.o. [name] Murl Zogg: [name]., [name] Other Clinician: Referring Sable Knoles: Treating Kendryck Lacroix/Extender: [name], [name] in Treatment: 60 Encounter Discharge Information Items Discharge Condition: Stable Ambulatory Status: [place] Discharge Destination: Home Transportation: Private Auto Accompanied By: self Schedule Follow-up Appointment: Yes Clinical Summary of Care: Electronic Signature(s) Signed: 11/27/2020 5:47:39 PM By: Deon Pilling Entered By: Deon Pilling on 11/27/2020 10:02:30 -------------------------------------------------------------------------------- Lower Extremity Assessment Details Patient Name: Date of Service: [place] [name] [facility] 11/27/2020 8:00 [place] Record Number: [id number] Patient Account Number: [id number] Date of Birth/Sex: Treating RN: Jan 19, 1941 (80 y.o. [name] Primary Care Nimo Verastegui: [name]., [name] Other Clinician: Referring Tauna Macfarlane: Treating Diron Haddon/Extender: [name], [name] in Treatment: 59 Edema Assessment Assessed: [Left: No] [Right: No] Edema: [Left: Yes] [Right: Yes] Calf Left: Right: Point of Measurement: 53 cm From Medial Instep 34 cm 32 cm Ankle Left: Right: Point of Measurement: 14 cm  From Medial Instep 22.2 cm 22.3 cm Vascular Assessment Pulses: Dorsalis Pedis Palpable: [Left:No] [Right:No] Electronic Signature(s) Signed: 11/27/2020 5:43:47 PM By: Baruch Gouty RN, BSN Entered By: Baruch Gouty on 11/27/2020 08:10:23 -------------------------------------------------------------------------------- Multi Wound Chart Details Patient Name: Date of Service: [place] Jarrell [place] 11/27/2020 8:00 A M Medical Record Number: [id number] Patient Account Number: [id number] Date of Birth/Sex: Treating RN: [birth year] (80 y.o. [name] Teresea Donley:  [name]., [name] Other Clinician: Referring Shuayb Schepers: Treating Tericka Devincenzi/Extender: [name], [name] in Treatment: 56 Vital Signs Height(in): 71 Pulse(bpm): 111 Weight(lbs): 198 Blood Pressure(mmHg): 121/77 Body Mass Index(BMI): 28 Temperature(F): 97.7 Respiratory Rate(breaths/min): 20 Photos: [14:No Photos Left, Lateral Lower Leg] [15:No Photos Left T Great oe] [16:No Photos Right, Lateral Lower Leg] Wound Location: [14:Blister] [15:Gradually Appeared] [16:Gradually Appeared] Wounding Event: [14:Venous Leg Ulcer] [15:Infection - not elsewhere classified Venous Leg Ulcer] Primary Etiology: [14:Anemia, Congestive Heart Failure,] [15:Anemia, Congestive Heart Failure,] [16:Anemia, Congestive Heart Failure,] Comorbid History: [14:Hypertension, Peripheral Venous Disease, End Stage Renal Disease 10/02/2020] [15:Hypertension, Peripheral Venous Disease, End Stage Renal Disease Disease, End Stage Renal Disease 10/09/2020] [16:Hypertension, Peripheral Venous  11/13/2020] Date Acquired: [14:8] [15:7] [16:2] Weeks of Treatment: [14:Open] [15:Open] [16:Healed - Epithelialized] Wound Status: [14:0.1x0.1x0.1] [15:0.5x1.2x0.1] [16:0x0x0] Measurements L x W x D (cm) [14:0.008] [15:0.471] [16:0] A (cm) : rea [14:0.001] [15:0.047] [16:0] Volume (cm) : [14:99.20%] [15:70.00%] [16:100.00%] %  Reduction in Area: [14:99.50%] [15:70.10%] [16:100.00%] % Reduction in Volume: [14:Full Thickness Without Exposed] [15:Full Thickness Without Exposed] [16:Full Thickness Without Exposed] Classification: [14:Support Structures None Present] [15:Support Structures Small] [16:Support Structures None Present] Exudate Amount: [14:N/A] [15:Purulent] [16:N/A] Exudate Type: [14:N/A] [15:yellow, brown, green] [16:N/A] Exudate Color: [14:Flat and Intact] [15:Flat and Intact] [16:N/A] Wound Margin: [14:Small (1-33%)] [15:Medium (34-66%)] [16:None Present (0%)] Granulation Amount: [14:Red] [15:Pink] [16:N/A] Granulation Quality: [14:None Present (0%)] [15:Medium (34-66%)] [16:None Present (0%)] Necrotic Amount: [14:Fascia: No] [15:Fat Layer (Subcutaneous Tissue): Yes Fascia: No] Exposed Structures: [14:Fat Layer (Subcutaneous Tissue): No Tendon: No Muscle: No Joint: No Bone: No Limited to Skin Breakdown Large (67-100%)] [15:Fascia: No Tendon: No Muscle: No Joint: No Bone: No Small (1-33%)] [16:Fat Layer (Subcutaneous Tissue): No Tendon: No Muscle:  No Joint: No Bone: No Large (67-100%)] Epithelialization: [14:Compression Therapy] [15:N/A] [16:N/A] Treatment Notes Electronic Signature(s) Signed: 11/27/2020 5:43:02 PM By: Linton Ham MD Signed: 11/27/2020 5:47:39 PM By: Deon Pilling Entered By: Linton Ham on 11/27/2020 08:26:30 -------------------------------------------------------------------------------- Multi-Disciplinary Care Plan Details Patient Name: Date of Service: [place], [place] 11/27/2020 8:00 A M Medical Record Number: [id number] Patient Account Number: [id number] Date of Birth/Sex: Treating RN: [birth year] (80 y.o. [name] Pearlina Friedly: Other Clinician: [name]., [name] Referring Normajean Nash: Treating Karletta Millay/Extender: [name], [name] in Treatment: 72 Multidisciplinary Care Plan reviewed with physician Active  Inactive Venous Leg Ulcer Nursing Diagnoses: Knowledge deficit related to disease process and management Potential for venous Insuffiency (use before diagnosis confirmed) Goals: Patient will maintain optimal edema control Date Initiated: 05/01/2020 Target Resolution Date: 12/26/2020 Goal Status: Active Interventions: Assess peripheral edema status every visit. Compression as ordered Provide education on venous insufficiency Notes: Electronic Signature(s) Signed: 11/27/2020 5:47:39 PM By: Deon Pilling Entered By: Deon Pilling on 11/27/2020 08:18:27 -------------------------------------------------------------------------------- Pain Assessment Details Patient Name: Date of Service: [place] [name] 11/27/2020 8:00 [place] Record Number: [id number] Patient Account Number: [id number] Date of Birth/Sex: Treating RN: 1941/08/17 (80 y.o. [name] Primary Care Shawnda Mauney: [name]., [name] Other Clinician: Referring Agatha Duplechain: Treating Miara Emminger/Extender: [name], [name] in Treatment: 50 Active Problems Location of Pain Severity and Description of Pain Patient Has Paino No Site Locations Rate the pain. Current Pain Level: 0 Pain Management and Medication Current Pain Management: Electronic Signature(s) Signed: 11/27/2020 5:43:47 PM By: Baruch Gouty RN, BSN Entered By: Baruch Gouty on 11/27/2020 08:08:31 -------------------------------------------------------------------------------- Patient/Caregiver Education Details Patient Name: Date of  Service: [place], [place] 3/31/2022andnbsp8:00 [place] Record Number: [id number] Patient Account Number: [id number] Date of Birth/Gender: Treating RN: 1940-09-14 (80 y.o. [name] Physician: [name]., [name] Other Clinician: Referring Physician: Treating Physician/Extender: [name], [name] in Treatment: 17 Education  Assessment Education Provided To: Patient Education Topics Provided Venous: Handouts: Managing Venous Disease and Related Ulcers Methods: Explain/Verbal Responses: Reinforcements needed Electronic Signature(s) Signed: 11/27/2020 5:47:39 PM By: Deon Pilling Entered By: Deon Pilling on 11/27/2020 08:18:40 -------------------------------------------------------------------------------- Wound Assessment Details Patient Name: Date of Service: [place] [name] [facility] 11/27/2020 8:00 [place] Record Number: [id number] Patient Account Number: [id number] Date of Birth/Sex: Treating RN: 1941/03/15 (80 y.o. [name] Primary Care Tiera Mensinger: [name]., [name] Other Clinician: Referring Shantae Vantol: Treating Jahree Dermody/Extender: [name], [name] in Treatment: 59 Wound Status Wound Number: 14 Primary Venous Leg Ulcer Etiology: Wound Location: Left, Lateral Lower Leg Wound Open Wounding Event: Blister Status: Date Acquired: 10/02/2020 Comorbid Anemia, Congestive Heart Failure, Hypertension, Peripheral Weeks Of Treatment: 8 History: Venous Disease, End Stage Renal Disease Clustered Wound: No Photos Wound Measurements Length: (cm) 0.1 Width: (cm) 0.1 Depth: (cm) 0.1 Area: (cm) 0.008 Volume: (cm) 0.001 % Reduction in Area: 99.2% % Reduction in Volume: 99.5% Epithelialization: Large (67-100%) Tunneling: No Undermining: No Wound Description Classification: Full Thickness Without Exposed Support Structures Wound Margin: Flat and Intact Exudate Amount: None Present Foul Odor After Cleansing: No Slough/Fibrino No Wound Bed Granulation Amount: Small (1-33%) Exposed Structure Granulation Quality: Red Fascia Exposed: No Necrotic Amount: None Present (0%) Fat Layer (Subcutaneous Tissue) Exposed: No Tendon Exposed: No Muscle Exposed: No Joint Exposed: No Bone Exposed: No Limited to Skin Breakdown Treatment Notes Wound #14 (Lower Leg) Wound  Laterality: Left, Lateral Cleanser Soap and Water Discharge Instruction: May shower and wash wound with dial antibacterial soap and water prior to dressing change. Peri-Wound Care Sween Lotion (Moisturizing lotion) Discharge Instruction: Apply moisturizing lotion as directed Triamcinolone 15 (g) Discharge Instruction: Mixed with lotion in clinic only. Use triamcinolone 15 (g) as directed Topical Primary Dressing KerraCel Ag Gelling Fiber Dressing, 4x5 in (silver alginate) Discharge Instruction: Apply silver alginate to wound bed as instructed Secondary Dressing Woven Gauze Sponge, Non-Sterile 4x4 in Discharge Instruction: Apply over primary dressing as directed. ABD Pad, 8x10 Discharge Instruction: Apply over primary dressing as directed. Secured With Compression Wrap FourPress (4 layer compression wrap) Discharge Instruction: ***Apply to BOTH LEGS.***Apply four layer compression as directed. Compression Stockings Add-Ons Notes stocking to right leg. Electronic Signature(s) Signed: 11/27/2020 5:28:26 PM By: Sandre Kitty Signed: 11/27/2020 5:43:47 PM By: Baruch Gouty RN, BSN Entered By: Sandre Kitty on 11/27/2020 16:38:53 -------------------------------------------------------------------------------- Wound Assessment Details Patient Name: Date of Service: [place] 11/27/2020 8:00 [place] Record Number: [id number] Patient Account Number: [id number] Date of Birth/Sex: Treating RN: 1941-01-12 (80 y.o. [name] Primary Care [place]: [name]., [name] Other Clinician: Referring Jalisha Enneking: Treating Aarionna Germer/Extender: [name], [name] in Treatment: 59 Wound Status Wound Number: 15 Primary Infection - not elsewhere classified Etiology: Wound Location: Left T Great oe Wound Open Wounding Event: Gradually Appeared Status: Date Acquired: 10/09/2020 Comorbid Anemia, Congestive Heart Failure, Hypertension,  Peripheral Weeks Of Treatment: 7 History: Venous Disease, End Stage Renal Disease Clustered Wound: No Photos Wound Measurements Length: (cm) 0.5 Width: (cm) 1.2 Depth: (cm) 0.1 Area: (cm) 0.471 Volume: (cm) 0.047 % Reduction  in Area: 70% % Reduction in Volume: 70.1% Epithelialization: Small (1-33%) Tunneling: No Undermining: No Wound Description Classification: Full Thickness Without Exposed Support Structures Wound Margin: Flat and Intact Exudate Amount: Small Exudate Type: Purulent Exudate Color: yellow, brown, green Foul Odor After Cleansing: No Slough/Fibrino Yes Wound Bed Granulation Amount: Medium (34-66%) Exposed Structure Granulation Quality: Pink Fascia Exposed: No Necrotic Amount: Medium (34-66%) Fat Layer (Subcutaneous Tissue) Exposed: Yes Necrotic Quality: Adherent Slough Tendon Exposed: No Muscle Exposed: No Joint Exposed: No Bone Exposed: No Treatment Notes Wound #15 (Toe Great) Wound Laterality: Left Cleanser Soap and Water Discharge Instruction: May shower and wash wound with dial antibacterial soap and water prior to dressing change. Peri-Wound Care Topical Mupirocin Ointment Discharge Instruction: ***In Clinic only***. Apply Mupirocin (Bactroban) under Alignate Ag. Polysporin Discharge Instruction: patient to purchase and home health to apply under the alginate Ag, Primary Dressing KerraCel Ag Gelling Fiber Dressing, 2x2 in (silver alginate) Discharge Instruction: Apply silver alginate to wound bed as instructed Secondary Dressing Woven Gauze Sponges 2x2 in Discharge Instruction: Apply over primary dressing as directed. Secured With Conforming Stretch Gauze Bandage, Sterile 2x75 (in/in) Discharge Instruction: Secure with stretch gauze as directed. Compression Wrap Compression Stockings Add-Ons Notes stocking to right leg. Electronic Signature(s) Signed: 11/27/2020 5:28:26 PM By: Sandre Kitty Signed: 11/27/2020 5:43:47 PM By:  Baruch Gouty RN, BSN Entered By: Sandre Kitty on 11/27/2020 16:39:27 -------------------------------------------------------------------------------- Wound Assessment Details Patient Name: Date of Service: [place] 11/27/2020 8:00 [place] Record Number: [id number] Patient Account Number: [id number] Date of Birth/Sex: Treating RN: Aug 19, 1941 (80 y.o. [name] Primary Care Janice Bodine: [name]., [name] Other Clinician: Referring Jaileigh Weimer: Treating Weston Kallman/Extender: [name], [name] in Treatment: 59 Wound Status Wound Number: 16 Primary Venous Leg Ulcer Etiology: Wound Location: Right, Lateral Lower Leg Wound Healed - Epithelialized Wounding Event: Gradually Appeared Status: Date Acquired: 11/13/2020 Comorbid Anemia, Congestive Heart Failure, Hypertension, Peripheral Weeks Of Treatment: 2 History: Venous Disease, End Stage Renal Disease Clustered Wound: No Wound Measurements Length: (cm) Width: (cm) Depth: (cm) Area: (cm) Volume: (cm) 0 % Reduction in Area: 100% 0 % Reduction in Volume: 100% 0 Epithelialization: Large (67-100%) 0 Tunneling: No 0 Undermining: No Wound Description Classification: Full Thickness Without Exposed Support Structures Exudate Amount: None Present Foul Odor After Cleansing: No Slough/Fibrino No Wound Bed Granulation Amount: None Present (0%) Exposed Structure Necrotic Amount: None Present (0%) Fascia Exposed: No Fat Layer (Subcutaneous Tissue) Exposed: No Tendon Exposed: No Muscle Exposed: No Joint Exposed: No Bone Exposed: No Electronic Signature(s) Signed: 11/27/2020 5:43:47 PM By: Baruch Gouty RN, BSN Entered By: Baruch Gouty on 11/27/2020 08:13:13 -------------------------------------------------------------------------------- [place] Details Patient Name: Date of Service: [place] 11/27/2020 8:00 A M Medical Record Number: [id number] Patient Account Number:  [id number] Date of Birth/Sex: Treating RN: 03-29-1941 (80 y.o. [name] Primary Care Lexia Vandevender: [name]., [name] Other Clinician: Referring Herve Haug: Treating Diana Armijo/Extender: [name], [name] in Treatment: 39 Vital Signs Time Taken: 08:05 Temperature (F): 97.7 Height (in): 71 Pulse (bpm): 111 Source: Stated Respiratory Rate (breaths/min): 20 Weight (lbs): 198 Blood Pressure (mmHg): 121/77 Source: Stated Reference Range: 80 - 120 mg / dl Body Mass Index (BMI): 27.6 Electronic Signature(s) Signed: 11/27/2020 5:43:47 PM By: Baruch Gouty RN, BSN Entered By: Baruch Gouty on 11/27/2020 58:09:98

## 2020-12-04 ENCOUNTER — Emergency Department (HOSPITAL_COMMUNITY): Payer: Medicare Other

## 2020-12-04 ENCOUNTER — Other Ambulatory Visit: Payer: Self-pay

## 2020-12-04 ENCOUNTER — Encounter (HOSPITAL_COMMUNITY): Payer: Self-pay

## 2020-12-04 ENCOUNTER — Emergency Department (HOSPITAL_COMMUNITY)
Admission: EM | Admit: 2020-12-04 | Discharge: 2020-12-04 | Disposition: A | Discharge status: Home / Self Care | Payer: Medicare Other | Attending: Emergency Medicine | Admitting: Emergency Medicine

## 2020-12-04 DIAGNOSIS — Z79899 Other long term (current) drug therapy: Secondary | ICD-10-CM | POA: Insufficient documentation

## 2020-12-04 DIAGNOSIS — R188 Other ascites: Secondary | ICD-10-CM | POA: Diagnosis not present

## 2020-12-04 DIAGNOSIS — Z7982 Long term (current) use of aspirin: Secondary | ICD-10-CM | POA: Insufficient documentation

## 2020-12-04 DIAGNOSIS — R2243 Localized swelling, mass and lump, lower limb, bilateral: Secondary | ICD-10-CM | POA: Diagnosis not present

## 2020-12-04 DIAGNOSIS — N184 Chronic kidney disease, stage 4 (severe): Secondary | ICD-10-CM | POA: Diagnosis not present

## 2020-12-04 DIAGNOSIS — I130 Hypertensive heart and chronic kidney disease with heart failure and stage 1 through stage 4 chronic kidney disease, or unspecified chronic kidney disease: Secondary | ICD-10-CM | POA: Diagnosis not present

## 2020-12-04 DIAGNOSIS — R0602 Shortness of breath: Secondary | ICD-10-CM | POA: Insufficient documentation

## 2020-12-04 DIAGNOSIS — D631 Anemia in chronic kidney disease: Secondary | ICD-10-CM | POA: Diagnosis not present

## 2020-12-04 DIAGNOSIS — E1122 Type 2 diabetes mellitus with diabetic chronic kidney disease: Secondary | ICD-10-CM | POA: Diagnosis not present

## 2020-12-04 DIAGNOSIS — Z87891 Personal history of nicotine dependence: Secondary | ICD-10-CM | POA: Diagnosis not present

## 2020-12-04 DIAGNOSIS — K7011 Alcoholic hepatitis with ascites: Secondary | ICD-10-CM

## 2020-12-04 DIAGNOSIS — R140 Abdominal distension (gaseous): Secondary | ICD-10-CM | POA: Insufficient documentation

## 2020-12-04 DIAGNOSIS — I5042 Chronic combined systolic (congestive) and diastolic (congestive) heart failure: Secondary | ICD-10-CM | POA: Diagnosis not present

## 2020-12-04 DIAGNOSIS — D649 Anemia, unspecified: Secondary | ICD-10-CM

## 2020-12-04 DIAGNOSIS — K769 Liver disease, unspecified: Secondary | ICD-10-CM

## 2020-12-04 LAB — CBC WITH DIFFERENTIAL/PLATELET
Abs Immature Granulocytes: 0.02 10*3/uL (ref 0.00–0.07)
Basophils Absolute: 0 10*3/uL (ref 0.0–0.1)
Basophils Relative: 1 %
Eosinophils Absolute: 0.2 10*3/uL (ref 0.0–0.5)
Eosinophils Relative: 4 %
HCT: 26.6 % — ABNORMAL LOW (ref 39.0–52.0)
Hemoglobin: 8.1 g/dL — ABNORMAL LOW (ref 13.0–17.0)
Immature Granulocytes: 0 %
Lymphocytes Relative: 8 %
Lymphs Abs: 0.4 10*3/uL — ABNORMAL LOW (ref 0.7–4.0)
MCH: 27.5 pg (ref 26.0–34.0)
MCHC: 30.5 g/dL (ref 30.0–36.0)
MCV: 90.2 fL (ref 80.0–100.0)
Monocytes Absolute: 0.3 10*3/uL (ref 0.1–1.0)
Monocytes Relative: 7 %
Neutro Abs: 3.7 10*3/uL (ref 1.7–7.7)
Neutrophils Relative %: 80 %
Platelets: 291 10*3/uL (ref 150–400)
RBC: 2.95 MIL/uL — ABNORMAL LOW (ref 4.22–5.81)
RDW: 17.2 % — ABNORMAL HIGH (ref 11.5–15.5)
WBC: 4.7 10*3/uL (ref 4.0–10.5)
nRBC: 0 % (ref 0.0–0.2)

## 2020-12-04 LAB — COMPREHENSIVE METABOLIC PANEL
ALT: 8 U/L (ref 0–44)
AST: 15 U/L (ref 15–41)
Albumin: 2.8 g/dL — ABNORMAL LOW (ref 3.5–5.0)
Alkaline Phosphatase: 55 U/L (ref 38–126)
Anion gap: 10 (ref 5–15)
BUN: 63 mg/dL — ABNORMAL HIGH (ref 8–23)
CO2: 26 mmol/L (ref 22–32)
Calcium: 8.6 mg/dL — ABNORMAL LOW (ref 8.9–10.3)
Chloride: 103 mmol/L (ref 98–111)
Creatinine, Ser: 2.46 mg/dL — ABNORMAL HIGH (ref 0.61–1.24)
GFR, Estimated: 26 mL/min — ABNORMAL LOW (ref >60)
Glucose, Bld: 95 mg/dL (ref 70–99)
Potassium: 3.7 mmol/L (ref 3.5–5.1)
Sodium: 139 mmol/L (ref 135–145)
Total Bilirubin: 1.1 mg/dL (ref 0.3–1.2)
Total Protein: 9.2 g/dL — ABNORMAL HIGH (ref 6.5–8.1)

## 2020-12-04 LAB — PROTIME-INR
INR: 1.2 (ref 0.8–1.2)
Prothrombin Time: 14.8 seconds (ref 11.4–15.2)

## 2020-12-04 LAB — AMMONIA: Ammonia: 16 umol/L (ref 9–35)

## 2020-12-04 LAB — BRAIN NATRIURETIC PEPTIDE: B Natriuretic Peptide: 2567.2 pg/mL — ABNORMAL HIGH (ref 0.0–100.0)

## 2020-12-04 MED ORDER — LIDOCAINE HCL 1 % IJ SOLN
INTRAMUSCULAR | Status: AC
  Filled 2020-12-04: qty 20

## 2020-12-04 MED ORDER — ALBUMIN HUMAN 25 % IV SOLN
25.0000 g | Freq: Once | INTRAVENOUS | Status: AC
  Administered 2020-12-04: 25 g via INTRAVENOUS
  Filled 2020-12-04: qty 100

## 2020-12-04 NOTE — Procedures (Signed)
PROCEDURE SUMMARY:  Successful image-guided paracentesis from the left lateral abdomen.  Yielded 8.2 liters of milky yellow fluid.  No immediate complications.  EBL = 0 mL. Patient tolerated well.   Specimen was not sent for labs.  Please see imaging section of Epic for full dictation.   Claris Pong Aleksia Freiman PA-C 12/04/2020 11:49 AM

## 2020-12-04 NOTE — ED Provider Notes (Signed)
MSE was initiated and I personally evaluated the patient and placed orders (if any) at  6:05 AM on December 04, 2020.  Patient with complicated medical history presents for difficulty breathing secondary to reaccumulated ascites. No cough, chest pain, abdominal pain, fever. He thinks the last time it was drained was 2 months ago. Patient is vague historian.   Today's Vitals   12/04/20 0600  BP: 126/84  Pulse: 99  Resp: 16  Temp: 97.8 F (36.6 C)  TempSrc: Oral  SpO2: 100%  Weight: 80.7 kg  Height: 5\' 11"  (1.803 m)   Body mass index is 24.83 kg/m.  Lungs clear and full.  Abdomen distended, taut, nontender.   The patient appears stable so that the remainder of the MSE may be completed by another provider.   Charlann Lange, PA-C 12/04/20 [id number]    Lucrezia Starch, MD 12/05/20 [phone]

## 2020-12-04 NOTE — ED Notes (Signed)
Patient off floor for scan 

## 2020-12-04 NOTE — ED Triage Notes (Signed)
Patient arrived with complaints of shortness of breath due to increased abdominal swelling. States he has had fluid drained from his abdomen a few months ago.

## 2020-12-04 NOTE — ED Provider Notes (Signed)
[facility] DEPT Provider Note   CSN: [id number] Arrival date & time: 12/04/20  0553     History Chief Complaint  Patient presents with  . Shortness of Breath    Ascites    [name] is a 80 y.o. male.  Patient with hx etoh cirrhosis, CKD, CHF, c/o increased abd swelling in past two weeks. Symptoms gradual onset, recurrent, moderate, persistent, worsening. States hx same and admission for chf, ascites, had paracentesis then. Pt quit drinking etoh months ago. Is eating and drinking normally. No vomiting or diarrhea. Having regular bms. No dysuria or hematuria, has chronic urinary cath. Denies fever or chills. No chest pain.   The history is provided by the patient.  Shortness of Breath Associated symptoms: no abdominal pain, no chest pain, no cough, no fever, no headaches, no neck pain, no rash, no sore throat and no vomiting        Past Medical History:  Diagnosis Date  . Acute cholecystitis 11/23/2017  . Acute GI bleeding 06/30/2019  . Acute respiratory failure with hypoxia ([place])   . Acute upper back pain   . AKI (acute kidney injury) ([place])   . Alcohol abuse 02/26/2015  . Anemia, deficiency   . Back pain    LUMBOSACRAL  . Bronchitis, complicated   . Cardiogenic shock ([place])   . Cardiomegaly   . Cholangitis due to bile duct calculus with obstruction   . Chronic combined systolic and diastolic CHF (congestive heart failure) ([place])   . Chronic pain    HIGH RISK MED USE COMPREHENSIVE HIGH RISK  . CKD (chronic kidney disease), stage III ([place])   . Demand ischemia ([place])    a. minimally elevated troponin peak 0.11 in 2016, felt demand ischemia. Cath offered but pt wished to have done back in [place].  . Diabetes mellitus without complication ([place])   . Edema, leg   . Elevated troponin 02/26/2015  . Essential hypertension   . Fatigue   . Heart abnormality   . Hyperlipidemia   . Hypertension   . Neck pain, acute   . PVD (peripheral vascular  disease) ([place])   . Renal lesion 11/23/2017  . SOB (shortness of breath) on exertion   . Solitary kidney   . Vitamin D deficiency     Patient Active Problem List   Diagnosis Date Noted  . Volume overload 10/12/2020  . GERD without esophagitis 10/11/2020  . 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 Cirrhosis of liver with ascites ([place]) 10/11/2020  . Chronic systolic CHF (congestive heart failure) ([place])   . Symptomatic anemia   . Multiple gastric ulcers   . CKD (chronic kidney disease), stage IV ([place]) 05/15/2020  . Chest pain 05/15/2020  . Palliative care encounter   . Acute on chronic congestive heart failure ([place]) 02/04/2019  . Anemia of chronic disease 03/08/2018  . Acute cholecystitis 11/23/2017  . Renal lesion 11/23/2017  . Alcohol abuse 02/26/2015  . Mixed hyperlipidemia   . Diabetes mellitus without complication ([place])   . Acute on chronic combined systolic and diastolic CHF (congestive heart failure) ([place])   . Essential hypertension     Past Surgical History:  Procedure Laterality Date  . BIOPSY  05/16/2020   Procedure: BIOPSY;  Surgeon: Carol Ada, MD;  Location: [facility] ENDOSCOPY;  Service: Endoscopy;;  . ENDOSCOPIC RETROGRADE CHOLANGIOPANCREATOGRAPHY (ERCP) WITH PROPOFOL N/A 06/19/2019   Procedure: ENDOSCOPIC RETROGRADE CHOLANGIOPANCREATOGRAPHY (ERCP) WITH PROPOFOL;  Surgeon: Milus Banister, MD;  Location: [facility] ENDOSCOPY;  Service: Endoscopy;  Laterality: N/A;  . ESOPHAGOGASTRODUODENOSCOPY (EGD) WITH PROPOFOL N/A 07/01/2019   Procedure: ESOPHAGOGASTRODUODENOSCOPY (EGD) WITH PROPOFOL;  Surgeon: Rush Landmark Telford Nab., MD;  Location: [facility];  Service: Gastroenterology;  Laterality: N/A;  . ESOPHAGOGASTRODUODENOSCOPY (EGD) WITH PROPOFOL N/A 05/16/2020   Procedure: ESOPHAGOGASTRODUODENOSCOPY (EGD) WITH PROPOFOL;  Surgeon: Carol Ada, MD;  Location: [facility] ENDOSCOPY;  Service: Endoscopy;  Laterality: N/A;  . HOT HEMOSTASIS N/A 05/16/2020   Procedure:  HOT HEMOSTASIS (ARGON PLASMA COAGULATION/BICAP);  Surgeon: Carol Ada, MD;  Location: [facility] ENDOSCOPY;  Service: Endoscopy;  Laterality: N/A;  . IR CHOLANGIOGRAM EXISTING TUBE  12/27/2017  . IR PERC CHOLECYSTOSTOMY  11/25/2017  . IR RADIOLOGIST EVAL & MGMT  02/02/2018  . REMOVAL OF STONES  06/19/2019   Procedure: REMOVAL OF STONES;  Surgeon: Milus Banister, MD;  Location: [facility] ENDOSCOPY;  Service: Endoscopy;;  . Joan Mayans  06/19/2019   Procedure: Joan Mayans;  Surgeon: Milus Banister, MD;  Location: [facility] ENDOSCOPY;  Service: Endoscopy;;       Family History  Problem Relation Age of Onset  . Other Mother        died in her sleep at 48  . Hypertension Sister   . Hyperlipidemia Sister   . Diabetes Mellitus II Sister   . Heart disease Sister   . CAD Neg Hx        neg hx premature CAD    Social History   Tobacco Use  . Smoking status: Former Smoker    Types: Cigarettes    Quit date: 02/26/2008    Years since quitting: 12.7  . Smokeless tobacco: Never Used  Vaping Use  . Vaping Use: Never used  Substance Use Topics  . Alcohol use: Yes    Comment: occ  . Drug use: No    Home Medications Prior to Admission medications   Medication Sig Start Date End Date Taking? Authorizing Provider  aspirin EC 81 MG tablet Take 81 mg by mouth daily. Swallow whole.    [provider]  carvedilol (COREG) 3.125 MG tablet Take 1 tablet (3.125 mg total) by mouth 2 (two) times daily with a meal. 10/15/20 01/13/21  Dahal, Marlowe Aschoff, MD  pantoprazole (PROTONIX) 40 MG tablet Take 1 tablet (40 mg total) by mouth daily. 10/15/20 01/13/21  Terrilee Croak, MD  torsemide (DEMADEX) 20 MG tablet Take 40 mg by mouth daily.    [provider]    Allergies    Patient has no known allergies.  Review of Systems   Review of Systems  Constitutional: Negative for chills and fever.  HENT: Negative for sore throat.   Eyes: Negative for redness.  Respiratory: Positive for shortness of breath.  Negative for cough.   Cardiovascular: Negative for chest pain.  Gastrointestinal: Positive for abdominal distention. Negative for abdominal pain and vomiting.  Genitourinary: Negative for dysuria and flank pain.  Musculoskeletal: Negative for back pain and neck pain.  Skin: Negative for rash.  Neurological: Negative for headaches.  Hematological: Does not bruise/bleed easily.  Psychiatric/Behavioral: Negative for confusion.    Physical Exam Updated Vital Signs BP 126/84 (BP Location: Left Arm)   Pulse 99   Temp 97.8 F (36.6 C) (Oral)   Resp 16   Ht 1.803 m (5\' 11" )   Wt 80.7 kg   SpO2 100%   BMI 24.83 kg/m   Physical Exam Vitals and nursing note reviewed.  Constitutional:      Appearance: Normal appearance. He is well-developed.  HENT:     Head: Atraumatic.  Nose: Nose normal.     Mouth/Throat:     Mouth: Mucous membranes are moist.     Pharynx: Oropharynx is clear.  Eyes:     General: No scleral icterus.    Conjunctiva/sclera: Conjunctivae normal.  Neck:     Trachea: No tracheal deviation.  Cardiovascular:     Rate and Rhythm: Normal rate and regular rhythm.     Pulses: Normal pulses.     Heart sounds: Normal heart sounds. No murmur heard. No friction rub. No gallop.   Pulmonary:     Effort: Pulmonary effort is normal. No accessory muscle usage or respiratory distress.     Breath sounds: Normal breath sounds.  Abdominal:     General: Bowel sounds are normal. There is distension.     Palpations: Abdomen is soft.     Tenderness: There is no abdominal tenderness. There is no guarding.     Comments: +ascties. Soft, non tender, reducible umbilical hernia.   Genitourinary:    Comments: No cva tenderness. Musculoskeletal:     Cervical back: Normal range of motion and neck supple. No rigidity.     Comments: Mild symmetric bilateral lower leg edema, dressing intact.   Skin:    General: Skin is warm and dry.     Findings: No rash.  Neurological:     Mental  Status: He is alert.     Comments: Alert, speech clear.   Psychiatric:        Mood and Affect: Mood normal.     ED Results / Procedures / Treatments   Labs (all labs ordered are listed, but only abnormal results are displayed) Results for orders placed or performed during the hospital encounter of 12/04/20  Comprehensive metabolic panel  Result Value Ref Range   Sodium 139 135 - 145 mmol/L   Potassium 3.7 3.5 - 5.1 mmol/L   Chloride 103 98 - 111 mmol/L   CO2 26 22 - 32 mmol/L   Glucose, Bld 95 70 - 99 mg/dL   BUN 63 (H) 8 - 23 mg/dL   Creatinine, Ser 2.46 (H) 0.61 - 1.24 mg/dL   Calcium 8.6 (L) 8.9 - 10.3 mg/dL   Total Protein 9.2 (H) 6.5 - 8.1 g/dL   Albumin 2.8 (L) 3.5 - 5.0 g/dL   AST 15 15 - 41 U/L   ALT 8 0 - 44 U/L   Alkaline Phosphatase 55 38 - 126 U/L   Total Bilirubin 1.1 0.3 - 1.2 mg/dL   GFR, Estimated 26 (L) >60 mL/min   Anion gap 10 5 - 15  CBC with Differential  Result Value Ref Range   WBC 4.7 4.0 - 10.5 K/uL   RBC 2.95 (L) 4.22 - 5.81 MIL/uL   Hemoglobin 8.1 (L) 13.0 - 17.0 g/dL   HCT 26.6 (L) 39.0 - 52.0 %   MCV 90.2 80.0 - 100.0 fL   MCH 27.5 26.0 - 34.0 pg   MCHC 30.5 30.0 - 36.0 g/dL   RDW 17.2 (H) 11.5 - 15.5 %   Platelets 291 150 - 400 K/uL   nRBC 0.0 0.0 - 0.2 %   Neutrophils Relative % 80 %   Neutro Abs 3.7 1.7 - 7.7 K/uL   Lymphocytes Relative 8 %   Lymphs Abs 0.4 (L) 0.7 - 4.0 K/uL   Monocytes Relative 7 %   Monocytes Absolute 0.3 0.1 - 1.0 K/uL   Eosinophils Relative 4 %   Eosinophils Absolute 0.2 0.0 - 0.5 K/uL   Basophils Relative  1 %   Basophils Absolute 0.0 0.0 - 0.1 K/uL   Immature Granulocytes 0 %   Abs Immature Granulocytes 0.02 0.00 - 0.07 K/uL  Ammonia  Result Value Ref Range   Ammonia 16 9 - 35 umol/L  Protime-INR  Result Value Ref Range   Prothrombin Time 14.8 11.4 - 15.2 seconds   INR 1.2 0.8 - 1.2  Brain natriuretic peptide  Result Value Ref Range   B Natriuretic Peptide 2,567.2 (H) 0.0 - 100.0 pg/mL   DG Chest  Port 1 View  Result Date: 12/04/2020 CLINICAL DATA:  Ascites.  Shortness of breath. EXAM: PORTABLE CHEST 1 VIEW COMPARISON:  Chest x-ray 10/11/2020. FINDINGS: Mediastinum hilar structures are stable. Stable severe cardiomegaly. Stable prominent central pulmonary arteries. No pulmonary venous congestion. No focal infiltrate. No pleural effusion or pneumothorax. IMPRESSION: 1. Stable severe cardiomegaly and prominent central pulmonary arteries. No pulmonary venous congestion. 2.  No acute infiltrate. Electronically Signed   By: Marcello Moores  Register   On: 12/04/2020 07:30    EKG None  Radiology DG Chest Port 1 View  Result Date: 12/04/2020 CLINICAL DATA:  Ascites.  Shortness of breath. EXAM: PORTABLE CHEST 1 VIEW COMPARISON:  Chest x-ray 10/11/2020. FINDINGS: Mediastinum hilar structures are stable. Stable severe cardiomegaly. Stable prominent central pulmonary arteries. No pulmonary venous congestion. No focal infiltrate. No pleural effusion or pneumothorax. IMPRESSION: 1. Stable severe cardiomegaly and prominent central pulmonary arteries. No pulmonary venous congestion. 2.  No acute infiltrate. Electronically Signed   By: Marcello Moores  Register   On: 12/04/2020 07:30    Procedures Procedures   Medications Ordered in ED Medications - No data to display  ED Course  I have reviewed the triage vital signs and the nursing notes.  Pertinent labs & imaging results that were available during my care of the patient were reviewed by me and considered in my medical decision making (see chart for details).    MDM Rules/Calculators/A&P                          Iv ns. Continuous pulse ox and cardiac monitoring. Stat labs and imaging.   Reviewed nursing notes and prior charts for additional history. Recent admission reviewed.  Labs reviewed/interpreted by me - CKD c/w baseline, k normal.  CXR reviewed/interpreted by me - no pna.   IR paracentesis ordered. Completed.   Albumin given iv.   Po  fluids/food.   Pt feels much improved. Is breathing comfortably. abd soft nt.   Pt currently appears stable for d/c.   Rec close pcp f/u.  Return precautions provided.     Final Clinical Impression(s) / ED Diagnoses Final diagnoses:  None    Rx / DC Orders ED Discharge Orders    None       Lajean Saver, MD 12/04/20 1324

## 2020-12-04 NOTE — Discharge Instructions (Addendum)
It was our pleasure to provide your ER care today - we hope that you feel better.  Limit salt intake.   Eat heart healthy diet.   Follow up with primary care doctor in the coming week.  Return to ER if worse, new symptoms, fevers, new or severe pain, severe abdominal pain, persistent vomiting, trouble breathing, weak/fainting, or other emergency concern.

## 2020-12-11 ENCOUNTER — Other Ambulatory Visit: Payer: Self-pay

## 2020-12-11 ENCOUNTER — Encounter (HOSPITAL_BASED_OUTPATIENT_CLINIC_OR_DEPARTMENT_OTHER): Payer: Medicare Other | Attending: Internal Medicine | Admitting: Internal Medicine

## 2020-12-11 DIAGNOSIS — Z7901 Long term (current) use of anticoagulants: Secondary | ICD-10-CM | POA: Insufficient documentation

## 2020-12-11 DIAGNOSIS — I890 Lymphedema, not elsewhere classified: Secondary | ICD-10-CM | POA: Insufficient documentation

## 2020-12-11 DIAGNOSIS — L97822 Non-pressure chronic ulcer of other part of left lower leg with fat layer exposed: Secondary | ICD-10-CM | POA: Diagnosis present

## 2020-12-11 DIAGNOSIS — D631 Anemia in chronic kidney disease: Secondary | ICD-10-CM | POA: Insufficient documentation

## 2020-12-11 DIAGNOSIS — S90812A Abrasion, left foot, initial encounter: Secondary | ICD-10-CM | POA: Diagnosis not present

## 2020-12-11 DIAGNOSIS — E1151 Type 2 diabetes mellitus with diabetic peripheral angiopathy without gangrene: Secondary | ICD-10-CM | POA: Diagnosis not present

## 2020-12-11 DIAGNOSIS — L97812 Non-pressure chronic ulcer of other part of right lower leg with fat layer exposed: Secondary | ICD-10-CM | POA: Insufficient documentation

## 2020-12-11 DIAGNOSIS — I132 Hypertensive heart and chronic kidney disease with heart failure and with stage 5 chronic kidney disease, or end stage renal disease: Secondary | ICD-10-CM | POA: Insufficient documentation

## 2020-12-11 DIAGNOSIS — E11621 Type 2 diabetes mellitus with foot ulcer: Secondary | ICD-10-CM | POA: Diagnosis not present

## 2020-12-11 DIAGNOSIS — E1122 Type 2 diabetes mellitus with diabetic chronic kidney disease: Secondary | ICD-10-CM | POA: Insufficient documentation

## 2020-12-11 DIAGNOSIS — N186 End stage renal disease: Secondary | ICD-10-CM | POA: Insufficient documentation

## 2020-12-11 DIAGNOSIS — I509 Heart failure, unspecified: Secondary | ICD-10-CM | POA: Insufficient documentation

## 2020-12-11 DIAGNOSIS — X58XXXA Exposure to other specified factors, initial encounter: Secondary | ICD-10-CM | POA: Diagnosis not present

## 2020-12-11 NOTE — Progress Notes (Signed)
Troy, Patton ([id number]) Visit Report for 12/11/2020 HPI Details Patient Name: Date of Service: [name], [name] 12/11/2020 8:00 A M Medical Record Number: [id number] Patient Account Number: [id number] Date of Birth/Sex: Treating RN: 22-Jul-1941 (80 y.o. [name], [name]., Troy [name] Other Clinician: Referring Provider: Treating Provider/Extender: [name]., [name]: 49 History of Present Illness HPI Description: ADMISSION 10/11/2019 This is a 80 year old man with a history of a severe cardiomyopathy with an ejection fraction of about 20%, chronic renal failure stage III. He is listed as a type II diabetic in epic although the patient denies this. He also has a history of PVD. He states for the last month he has had wounds on his bilateral lower extremities that started off as blisters which denuded. He has areas on the left lateral calf and 2 on the right lateral. He has an area on the left first met head which he did not know was there he we identified this on intake. He has been using Silvadene cream provided by his primary care physician but he is complaining that this burns. 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 He was acutely ill at that time complicating GI bleed. ABIs in our clinic were 1.16 on the right and 1.13 on the left 2/25; the patient comes in with his areas on the left lateral and right lateral calf. There is also an area over the left first MTP bunion deformity. We have been using Sorbact. His edema control is fairly good 3/4; left lateral  and right lateral calf. Most of his wounds are in the same position tightly adherent nonviable debris. On the right we debrided the superior wound on the left both wounds. The area on his bunion over the left first MTP medially is I think just about closed. We have been using Sorbact without a lot of success changed to Iodoflex under compression 3/11; left lateral and right lateral calf perhaps minor improvement in the surface condition. We have been using Iodoflex. The area over the bunion of the left first MTP has closed over 3/18; left lateral and right lateral calf not much improvement. We have been using Iodoflex. Aggressive debridement last week. 3/25; left lateral and right lateral calf. We have been using Iodoflex. There is some improvement in the superior area on the right and 2 on the lateral left although there is still a lot of debris on the surface. The inferior area on the right is still a completely nonviable surface. 4/8; left lateral and right lateral calfs. Essentially mirror-image looking wounds 2 wounds on each side in close juxtaposition we have been using Iodoflex with some improvement in the very adherent fibrinous debris but not a lot. The patient has arterial studies next Wednesday morning and venous studies next Thursday morning. I have been avoiding any further aggressive debridement until we see the arterial study results. His ABIs were fairly good in this clinic and is dorsalis pedis pulses are palpable but the wound beds are pale. 4/15; left lateral and right lateral calfs. Essentially mirror-image wounds with 2 wounds on each side in close juxtaposition  but with rims of separating normal tissue. We have been using Iodoflex. The base of the wounds has been cleaning up quite nicely ARTERIAL STUDIES were done showing the patient had an ABI on the right of 1.27 with triphasic waveforms and a TBI of 0.90 on the left triphasic waveforms with an ABI of 1.28 and a TBI of 0.87.  No evidence of arterial disease VENOUS REFLUX STUDIES; were done yesterday we do not have this report yet. 4/23; VENOUS REFLUX STUDIES did not show any significant reflux right lower extremity. No evidence of a DVT He did have significant reflux in the common . femoral vein on the left but nothing else was listed as significant. He did not have a DVT. We have been using Iodoflex under compression his wounds are making progress. 4/29; improvements in the wound surface continue. We have been using Iodoflex. He has a 20% out-of-pocket co-pay for Apligraf which is unfortunate. We changed him to Sorbact today 5/6; started on Sorbact last week. Better looking wound surface but not much change in dimensions. 01/24/20-Patient is back at 3 weeks, wound surfaces are about the same but very minimal slough on the right leg wounds, however there is blistering on both legs adjacent to the wounds, patient denies any other symptoms or new symptoms. His studies have been reviewed and do not indicate any significant arterial or venous disease. But he is attending once in 3 weeks with home health changing in between 6/3; patient has 4 wounds 2 on each side of his lateral lower legs. These wounds are somewhat improved. He apparently arrived in clinic last week with a large blister on the right lateral lower leg that had denuded into the wound. They changed to silver alginate. Still under compression. He has straight Medicare and unfortunately has an unlimited co-pay for advanced Patton options. 6/10; his original 4 wounds are all better especially on the left lateral lower leg. 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 6/24; blister on the right lateral lower leg has healed. There is no additional wounds. The traumatic left second toe is also closed. The 4 that he has are all better 7/1; all of the patient's wounds are smaller except for the proximal one on the left lateral calf. We are using silver collagen 7/22; patient has home health. Relatively refractory wounds on the lateral part of both mid calfs. Each of them 2 wounds. The areas on the left are doing much better in fact the distal 1 looks like it is on his way to closure. The right required debridement with adherent fibrinous debris under illumination. We have been using silver collagen. Previous arterial studies were within normal limits he also had venous reflux studies 04/03/2020 upon evaluation today patient appears to be doing excellent with regard to his lower extremity ulcers. He is going to require a little bit of debridement on the wounds on the right especially in order to clear away some biofilm and slough but this is minimal and overall he seems to be doing quite well. 8/19; the patient's wounds on the left lateral lower leg just above closed. However the 2  on the lateral part of the right have a nonviable necrotic surface. This is disappointing. There is also no change in dimensions. He has had arterial studies as well as venous reflux studies 8/26; very disappointing today. Even the more superficial areas on the left are about the same as last week. Still 2 punched-out areas completely unchanged on the right. We have been using silver collagen really making no progress. 9/2; changed to Iodoflex last week better looking wound surfaces especially on the right although they are deep and punched out. The areas on the left are more superficial open 1 of these is almost fully epithelialized although it has been this way for at least 2 weeks. He has a 20% co-pay [Medicare only] unaffordable for a skin  substitute. Had some thought about a snap VAC on the deep areas on the right leg we will try to put this through in the insurance 9/9; punched-out wounds on the bilateral lateral lower extremities. We have been operating as if the these were chronic venous wounds with secondary lymphedema. The areas on the left have been doing well in fact one of them is closed over. We have had no improvement in the areas on the right we have been using Iodoflex to help with surface debridement. The areas on the right are deeper wider. I do not see any evidence of infection. Home health is not been putting the wraps on high enough he has localized lymphedema right above the wounds. He has had arterial and venous studies already 9/16; punched-out mirror-image wounds on his bilateral lateral mid calfs. We have 1 closed on the left lateral the other smaller. For the first time today the areas on the right lateral actually looks some better. I did a biopsy of 1 of these last time although we still do not have this result. HOWEVER he arrives in clinic today complaining of chest pain overnight which felt like a brick on his chest. He also had 2 black watery bowel movements. He does not have nausea or vomiting. He is not describing abdominal pain. He has no prior history of diarrhea heartburn etc. 9/30; sent this patient to the ER on 9/16. 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 He is on twice daily PPIs Protonix. His admission hemoglobin was exceptionally low at 5. 8.. Discharged at 8.1. 4 unit transfusion. 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 His diuretic dose was actually decreased because of elevated creatinine currently at torsemide 20 mg not felt to be a Coumadin candidate. Patient states he feels well. The areas on his left leg are healed. The areas on the right look better. These have been very refractory  wounds. He does not have stockings we will make arrangements for this today 10/7; the areas on the left leg are healed we will transition him into his 20/30 stocking today. Continued improvement in the areas on the right lateral leg 10/21; the areas on the left leg remain healed at this 2-week follow-up however there is a history that he developed some drainage from one of the wound sites with home health therefore going ahead and wrapping him. He was supposed to be on 20/30 stockings although the history here is vague and I am not sure that this represents a stocking failure or not. We have been wrapping his right leg with the one remaining wound is the distal wound. We  have been using silver collagen 10/28 the patient only has 1 wound remaining on the right lower calf. This still has 3 mm of depth which is unchanged but the surface area is smaller. The superior wound on the right lateral is closed. The 2 wounds on the left remain closed and he is using his compression stocking 11/4 1 remaining wound on the right lower calf. Under illumination not a viable surface we have been using silver collagen we had good effect on the other areas but this 1 appears to be stalled. There is no open area on the left leg he is using his own stocking. 11/11; 1 remaining wound on the right lower calf. Surface of this is a lot better than last week but still requiring debridement I am using Iodoflex but looking forward to changing the primary dressing to perhaps endoform 11/18; 1 remaining wound on the right lower leg, venous insufficiency, vigorous debridement last week/Iodoflex. Depth of the wound is therefore larger but the wound looks clean. Still very gritty material at the surface requiring debridement 12/2; the remaining wound on his right lower leg, venous insufficiency. I changed him to endoform 2 weeks ago. We appear to be making nice progress. Wound is measuring smaller 12/16; he comes in today with one of  his two wounds on the left opened infected second area on the left may be threatened as well as the healed one on the right. He has edema in both legs which I think is pitting. He says his weights are stable he has unknown fairly severe cardiomyopathy. His cardiologist is Dr. Brigitte Pulse it does not sound like he is really been carefully followed 12/23; the areas that I was concerned about on the left look a lot better we put some silver alginate and put him back in compression. It may be the compression that actually does the trick here. The remaining open area we have been using Iodoflex and again this looks a lot better. At our suggestion he went to see his primary who did not adjust his diuretic he is still apparently taking 20 mg of a loop diuretic and "sometimes" 40 mg is basically what the patient stated 1/6; the area on the left is closed once again. We will transition him into his 20/30 stocking from elastic therapy both areas on the right are now open which is a deterioration from last time at which time the only 1 was open but they are very superficial. We have been using Hydrofera Patton under compression 1/27; its been 3 weeks since the patient was here. We transitioned him to a 20/30 stocking on the left leg. Still under 4-layer compression on the right. He comes in today with a new small opening on the left leg but reopening on the right. Massive blistering on the right leg. He has severe systemic fluid volume overload. I have looked over the patient's records. He was seen in 2020 by tele health by cardiology. 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 I think he was supposed to have follow-up but that follow-up never seems to have happened. He does not complain of chest pain or shortness of breath but notes increasing edema not just in his lower legs 2/3; he has new open areas on the bilateral anterior lower legs. I think a lot of this is  because of systemic fluid volume overload. The ultrasound I ordered showed ascites and suggestion of cirrhosis. His physical exam  continues to suggest right greater than left congestive heart failure. I have him taking 40 mg of Lasix oral daily I have asked him to start weighing himself daily. With the intense efforts of our nursing staff we have him a cardiology appointment on Monday or Tuesday of next week I have told him not to miss this. With regards to his wounds I do not think we are going to be able to stop wrapping him until somebody manages to get his fluid volume down 2/10; the areas on his bilateral lateral lower legs actually look better today. He has been taking Lasix 40 mg a day I believe [2 x 20 mg]. He seems to have less edema in his legs and and is a result of this I think he has less edema in his lower legs as well and the wounds appear to be doing better. He comes in today with purulence coming out of the toenail bed of the left great toe it appears that his toenail came off there was noted to be pus I did a culture of this area 2/24; since the patient was last here 2 weeks ago he was admitted to hospital from 2/12 through 2/16 predominantly acute on chronic systolic heart failure. He also has stage IV chronic kidney disease discharge creatinine at 2.71 and on top of this morphology of the liver on ultrasound suggested cirrhosis of unclear etiology. He had 9.4 L of ascites tapped I am unclear whether this was sent for analysis at the time of this dictation. He is currently taking 40 mg of Demadex a day I am a bit surprised that was so low. Nevertheless the wounds on his legs are a lot better everything is closed on the right he still has an open area on the left and then the area on the great toe nail matrix on the left from last time. 10/30/2020 upon evaluation today patient appears to be doing well with regard to his legs other than the fact that they are extremely swollen. He has  multiple blisters noted bilateral lower extremities. Apparently the home health nurse came out yesterday unwrapped them and noticed a couple blisters she called 911 and would not rewrap him. There is no evidence of infection whatsoever we did not receive a phone call that we are aware of anyway regarding the fact that the home health nurse had any concerns at all. Nonetheless the major issue here is that the patient is going to really need to make sure that his fluids under good control but I do not see anything that indicates he needs to go to the hospital right now. There is literally no evidence of infection although fluid is just completely clear. 3/16; patient has home health. Apparently the last time he was here home health did not put compression wraps back on his legs after discovering blisters. By the time he got into see [place] he had had several additional blisters and wounds open up. He was put back in 4 layer compression most of this appears to be healing. He still has small superficial areas on his bilateral lower extremities He also has an area in the left great toe nailbed after lost his toe nail. 3/31; 2-week follow-up. Everything on the right leg is healed he has only has a very small open area remaining on the left leg in one of his original wound sites this should be healed by the next time he is here we have good edema control bilaterally. He  removed his toenail last time he was here. Drainage from this we are using topical antibiotics Finally he has increasing ascites which I think is probably cirrhosis rather than heart failure. He said he was going to go to the emergency room. I suggested that he go to his primary doctor get the primary doctor updated in where this is. If they are going to do paracentesis then that may be able to be done in interventional radiology rather than sitting for hours in the emergency room. He does not look to be in any distress. 4/14; patient returns to  clinic. The stockings he had did not maintain skin integrity on the right he has 3 open wounds here which started on blisters. Predictably everything is closed on the left. He tells me that since he was last here he had to go to the emergency room to deal with the ascites they tapped 8 L of fluid. Again if anybody is aware of the exact etiology of this with certainty the patient is not aware of this. All I know is the ultrasound I did suggested cirrhosis. 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 Electronic Signature(s) Signed: 12/11/2020 5:23:41 PM By: Linton Ham MD Entered By: Linton Ham on 12/11/2020 08:30:00 -------------------------------------------------------------------------------- Physical Exam Details Patient Name: Date of Service: [name] 12/11/2020 8:00 A M Medical Record Number: [id number] Patient Account Number: [id number] Date of Birth/Sex: Treating RN: 03/30/1941 (80 y.o. [name], [name]., Troy [name] Other Clinician: Referring Provider: Treating Provider/Extender: [name]., [name]: 60 Constitutional Sitting or standing Blood Pressure is within target range for patient.. Pulse regular and within target range for patient.Marland Kitchen Respirations regular, non-labored and within target range.. Temperature is normal and within the target range for the patient.Marland Kitchen Appears in no distress. Cardiovascular Pedal pulses are palpable. Notes Wound exam; the area on the left leg is closed his edema control is excellent. However he has 3 open areas on the right lateral leg no doubt these were blisters. His edema control is not adequate here. Electronic Signature(s) Signed: 12/11/2020 5:23:41 PM By: Linton Ham MD Entered By: Linton Ham on 12/11/2020  08:31:02 -------------------------------------------------------------------------------- Physician Orders Details Patient Name: Date of Service: [name] 12/11/2020 8:00 [place] Record Number: [id number] Patient Account Number: [id number] Date of Birth/Sex: Treating RN: [birth year] (80 y.o. [name], [name]., Troy [name] Other Clinician: Referring Provider: Treating Provider/Extender: [name]., [name]: 66 Verbal / Phone Orders: No Diagnosis Coding Follow-up Appointments ppointment in 2 weeks. - Thursday Return A Bathing/ Shower/ Hygiene May shower with protection but do not get wound dressing(s) wet. May shower and wash wound with soap and water. - with dressing changes only. Edema Control - Lymphedema / SCD / Other Elevate legs to the level of the heart or above for 30 minutes daily and/or when sitting, a frequency of: - 3-4 times a day throughout the day. Avoid standing for long periods of time. Exercise regularly Moisturize legs daily. - right leg every night. Compression stocking or Garment 20-30 mm/Hg pressure to: - apply in the morning and remove at night to right leg only for now. Home Health No change in wound care orders this week; continue Home Health for wound care. May utilize formulary equivalent dressing for wound  Patton orders unless otherwise specified. - [facility] home health twice a week. Wound Patton Wound #15 - T Great oe Wound Laterality: Left Cleanser: Soap and Water (Home Health) 2 x Per Day/15 Days Discharge Instructions: May shower and wash wound with dial antibacterial soap and water prior to dressing change. Topical: Mupirocin Ointment 2 x Per Day/15 Days Discharge Instructions: ***In Clinic only***. Apply Mupirocin (Bactroban) under Alignate Ag. Topical: Polysporin (Home Health) 2 x Per Day/15 Days Discharge Instructions: patient to purchase and home health to  apply under the alginate Ag, Prim Dressing: KerraCel Ag Gelling Fiber Dressing, 2x2 in (silver alginate) ([place]) 2 x Per Day/15 Days ary Discharge Instructions: Apply silver alginate to wound bed as instructed Secondary Dressing: Woven Gauze Sponges 2x2 in ([place]) 2 x Per Day/15 Days Discharge Instructions: Apply over primary dressing as directed. Secured With: Child psychotherapist, Sterile 2x75 (in/in) (Home Health) 2 x Per Day/15 Days Discharge Instructions: Secure with stretch gauze as directed. Wound #16R - Lower Leg Wound Laterality: Right, Lateral Cleanser: Soap and Water (Home Health) 2 x Per Week/15 Days Discharge Instructions: May shower and wash wound with dial antibacterial soap and water prior to dressing change. Cleanser: Wound Cleanser (Home Health) 2 x Per Week/15 Days Discharge Instructions: Cleanse the wound with wound cleanser prior to applying a clean dressing using gauze sponges, not tissue or cotton balls. Peri-Wound Care: Sween Lotion (Moisturizing lotion) (Home Health) 2 x Per Week/15 Days Discharge Instructions: Apply moisturizing lotion as directed Prim Dressing: KerraCel Ag Gelling Fiber Dressing, 4x5 in (silver alginate) (Home Health) 2 x Per Week/15 Days ary Discharge Instructions: Apply silver alginate to wound bed as instructed Secondary Dressing: Woven Gauze Sponge, Non-Sterile 4x4 in (Home Health) 2 x Per Week/15 Days Discharge Instructions: Apply over primary dressing as directed. Secondary Dressing: ABD Pad, 5x9 (Home Health) 2 x Per Week/15 Days Discharge Instructions: Apply over primary dressing as directed. Compression Wrap: FourPress (4 layer compression wrap) (Home Health) 2 x Per Week/15 Days Discharge Instructions: Apply four layer compression as directed. Wound #17 - Lower Leg Wound Laterality: Right, Lateral, Proximal Cleanser: Soap and Water (Home Health) 2 x Per Week/15 Days Discharge Instructions: May shower and wash  wound with dial antibacterial soap and water prior to dressing change. Cleanser: Wound Cleanser (Home Health) 2 x Per Week/15 Days Discharge Instructions: Cleanse the wound with wound cleanser prior to applying a clean dressing using gauze sponges, not tissue or cotton balls. Peri-Wound Care: Sween Lotion (Moisturizing lotion) (Home Health) 2 x Per Week/15 Days Discharge Instructions: Apply moisturizing lotion as directed Prim Dressing: KerraCel Ag Gelling Fiber Dressing, 4x5 in (silver alginate) (Home Health) 2 x Per Week/15 Days ary Discharge Instructions: Apply silver alginate to wound bed as instructed Secondary Dressing: Woven Gauze Sponge, Non-Sterile 4x4 in (Home Health) 2 x Per Week/15 Days Discharge Instructions: Apply over primary dressing as directed. Secondary Dressing: ABD Pad, 5x9 (Home Health) 2 x Per Week/15 Days Discharge Instructions: Apply over primary dressing as directed. Compression Wrap: FourPress (4 layer compression wrap) (Home Health) 2 x Per Week/15 Days Discharge Instructions: Apply four layer compression as directed. Wound #18 - Lower Leg Wound Laterality: Right, Lateral, Distal Cleanser: Soap and Water (Home Health) 2 x Per Week/15 Days Discharge Instructions: May shower and wash wound with dial antibacterial soap and water prior to dressing change. Cleanser: Wound Cleanser (Home Health) 2 x Per Week/15 Days Discharge Instructions: Cleanse the wound with wound cleanser prior to applying a clean dressing using  gauze sponges, not tissue or cotton balls. Peri-Wound Care: Sween Lotion (Moisturizing lotion) (Home Health) 2 x Per Week/15 Days Discharge Instructions: Apply moisturizing lotion as directed Prim Dressing: KerraCel Ag Gelling Fiber Dressing, 4x5 in (silver alginate) (Home Health) 2 x Per Week/15 Days ary Discharge Instructions: Apply silver alginate to wound bed as instructed Secondary Dressing: Woven Gauze Sponge, Non-Sterile 4x4 in (Home Health) 2 x Per  Week/15 Days Discharge Instructions: Apply over primary dressing as directed. Secondary Dressing: ABD Pad, 5x9 (Home Health) 2 x Per Week/15 Days Discharge Instructions: Apply over primary dressing as directed. Compression Wrap: FourPress (4 layer compression wrap) (Home Health) 2 x Per Week/15 Days Discharge Instructions: Apply four layer compression as directed. Wound #19 - Lower Leg Wound Laterality: Left, Posterior Cleanser: Soap and Water (Home Health) 2 x Per Week/15 Days Discharge Instructions: May shower and wash wound with dial antibacterial soap and water prior to dressing change. Cleanser: Wound Cleanser (Home Health) 2 x Per Week/15 Days Discharge Instructions: Cleanse the wound with wound cleanser prior to applying a clean dressing using gauze sponges, not tissue or cotton balls. Peri-Wound Care: Sween Lotion (Moisturizing lotion) (Home Health) 2 x Per Week/15 Days Discharge Instructions: Apply moisturizing lotion as directed Prim Dressing: KerraCel Ag Gelling Fiber Dressing, 4x5 in (silver alginate) (Home Health) 2 x Per Week/15 Days ary Discharge Instructions: Apply silver alginate to wound bed as instructed Secondary Dressing: Woven Gauze Sponge, Non-Sterile 4x4 in (Home Health) 2 x Per Week/15 Days Discharge Instructions: Apply over primary dressing as directed. Secondary Dressing: ABD Pad, 5x9 (Home Health) 2 x Per Week/15 Days Discharge Instructions: Apply over primary dressing as directed. Compression Wrap: FourPress (4 layer compression wrap) (Home Health) 2 x Per Week/15 Days Discharge Instructions: Apply four layer compression as directed. Electronic Signature(s) Signed: 12/11/2020 5:23:41 PM By: Linton Ham MD Signed: 12/11/2020 5:45:48 PM By: Rhae Hammock RN Entered By: Rhae Hammock on 12/11/2020 08:27:17 -------------------------------------------------------------------------------- Problem List Details Patient Name: Date of Service: [name],  [name] 12/11/2020 8:00 Troy Record Number: [id number] Patient Account Number: [id number] Date of Birth/Sex: Treating RN: 18-Feb-1941 (80 y.o. 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 10/11/2019 No Yes extremity I89.0 Lymphedema, not elsewhere classified 10/11/2019 No Yes L97.822 Non-pressure chronic ulcer of other part of left lower leg with fat layer exposed2/06/2020 No Yes L97.812 Non-pressure chronic ulcer of other part of right lower leg with fat layer 01/03/2020 No Yes exposed E09.23 Chronic systolic (congestive) heart failure 10/02/2020 No Yes S90.812D Abrasion, left foot, subsequent encounter 10/09/2020 No Yes Inactive Problems ICD-10 Code Description Active Date Inactive Date L97.521 Non-pressure chronic ulcer of other part of left foot limited to breakdown of skin 10/11/2019 10/11/2019 S90.812D Abrasion, left foot, subsequent encounter 02/07/2020 02/07/2020 I42.9 Cardiomyopathy, unspecified 05/15/2020 05/15/2020 K92.1 Melena 05/15/2020 05/15/2020 Resolved Problems ICD-10 Code Description Active Date Resolved Date 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 10/11/2019 10/11/2019 Electronic Signature(s) Signed: 12/11/2020 5:23:41 PM By: Linton Ham MD Entered By: Linton Ham on 12/11/2020 08:27:50 -------------------------------------------------------------------------------- Progress Note Details Patient Name: Date of Service: [name] 12/11/2020 8:00 [place] Record Number: [id number] Patient Account Number: [id number] Date of Birth/Sex: Treating RN: Apr 10, 1941 (80 y.o. [name], Troy Primary  Care Provider: Frederik Patton., Troy [name] Other Clinician: Referring Provider: Treating Provider/Extender: [name]., [name]: 84 Subjective History of Present Illness (HPI) ADMISSION 10/11/2019 This is a 80 year old man with a history of a severe cardiomyopathy with an ejection fraction of about 20%, chronic renal failure stage III. He is listed as a type II diabetic in epic although the patient denies this. He also has a history of PVD. He states for the last month he has had wounds on his bilateral lower extremities that started off as blisters which denuded. He has areas on the left lateral calf and 2 on the right lateral. He has an area on the left first met head which he did not know was there he we identified this on intake. He has been using Silvadene cream provided by his primary care physician but he is complaining that this burns. 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 He was acutely ill at that time complicating GI bleed. ABIs in our clinic were 1.16 on the right and 1.13 on the left 2/25; the patient comes in with his areas on the left lateral and right lateral calf. There is also an area over the left first MTP bunion deformity. We have been using Sorbact. His edema control is fairly good 3/4; left lateral and right lateral calf. Most of his wounds are in the same position tightly adherent nonviable debris. On the right we debrided the superior wound on the left both wounds. The area on his bunion over the left first MTP medially is I think just about closed. We have been using Sorbact without a lot of success  changed to Iodoflex under compression 3/11; left lateral and right lateral calf perhaps minor improvement in the surface condition. We have been using Iodoflex. The area over the bunion of the left first MTP has closed over 3/18; left lateral and right lateral calf not much improvement. We have been using Iodoflex. Aggressive debridement last week. 3/25; left lateral and right lateral calf. We have been using Iodoflex. There is some improvement in the superior area on the right and 2 on the lateral left although there is still a lot of debris on the surface. The inferior area on the right is still a completely nonviable surface. 4/8; left lateral and right lateral calfs. Essentially mirror-image looking wounds 2 wounds on each side in close juxtaposition we have been using Iodoflex with some improvement in the very adherent fibrinous debris but not a lot. The patient has arterial studies next Wednesday morning and venous studies next Thursday morning. I have been avoiding any further aggressive debridement until we see the arterial study results. His ABIs were fairly good in this clinic and is dorsalis pedis pulses are palpable but the wound beds are pale. 4/15; left lateral and right lateral calfs. Essentially mirror-image wounds with 2 wounds on each side in close juxtaposition but with rims of separating normal tissue. We have been using Iodoflex. The base of the wounds has been cleaning up quite nicely ARTERIAL STUDIES were done showing the patient had an ABI on the right of 1.27 with triphasic waveforms and a  TBI of 0.90 on the left triphasic waveforms with an ABI of 1.28 and a TBI of 0.87. No evidence of arterial disease VENOUS REFLUX STUDIES; were done yesterday we do not have this report yet. 4/23; VENOUS REFLUX STUDIES did not show any significant reflux right lower extremity. No evidence of a DVT He did have significant reflux in the common . femoral vein on the left but nothing else was  listed as significant. He did not have a DVT. We have been using Iodoflex under compression his wounds are making progress. 4/29; improvements in the wound surface continue. We have been using Iodoflex. He has a 20% out-of-pocket co-pay for Apligraf which is unfortunate. We changed him to Sorbact today 5/6; started on Sorbact last week. Better looking wound surface but not much change in dimensions. 01/24/20-Patient is back at 3 weeks, wound surfaces are about the same but very minimal slough on the right leg wounds, however there is blistering on both legs adjacent to the wounds, patient denies any other symptoms or new symptoms. His studies have been reviewed and do not indicate any significant arterial or venous disease. But he is attending once in 3 weeks with home health changing in between 6/3; patient has 4 wounds 2 on each side of his lateral lower legs. These wounds are somewhat improved. He apparently arrived in clinic last week with a large blister on the right lateral lower leg that had denuded into the wound. They changed to silver alginate. Still under compression. He has straight Medicare and unfortunately has an unlimited co-pay for advanced [place] options. 6/10; his original 4 wounds are all better especially on the left lateral lower leg. 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 6/24; blister on the right lateral lower leg has healed. There is no additional wounds. The traumatic left second toe is also closed. The 4 that he has are all better 7/1; all of the patient's wounds are smaller except for the proximal one on the left lateral calf.  We are using silver collagen 7/22; patient has home health. Relatively refractory wounds on the lateral part of both mid calfs. Each of them 2 wounds. The areas on the left are doing much better in fact the distal 1 looks like it is on his way to closure. The right required debridement with adherent fibrinous debris under illumination. We have been using silver collagen. Previous arterial studies were within normal limits he also had venous reflux studies 04/03/2020 upon evaluation today patient appears to be doing excellent with regard to his lower extremity ulcers. He is going to require a little bit of debridement on the wounds on the right especially in order to clear away some biofilm and slough but this is minimal and overall he seems to be doing quite well. 8/19; the patient's wounds on the left lateral lower leg just above closed. However the 2 on the lateral part of the right have a nonviable necrotic surface. This is disappointing. There is also no change in dimensions. He has had arterial studies as well as venous reflux studies 8/26; very disappointing today. Even the more superficial areas on  the left are about the same as last week. Still 2 punched-out areas completely unchanged on the right. We have been using silver collagen really making no progress. 9/2; changed to Iodoflex last week better looking wound surfaces especially on the right although they are deep and punched out. The areas on the left are more superficial open 1 of these is almost fully epithelialized although it has been this way for at least 2 weeks. He has a 20% co-pay [Medicare only] unaffordable for a skin substitute. Had some thought about a snap VAC on the deep areas on the right leg we will try to put this through in the insurance 9/9; punched-out wounds on the bilateral lateral lower extremities. We have been operating as if the these were chronic venous wounds with secondary lymphedema. The areas on the left have  been doing well in fact one of them is closed over. We have had no improvement in the areas on the right we have been using Iodoflex to help with surface debridement. The areas on the right are deeper wider. I do not see any evidence of infection. Home health is not been putting the wraps on high enough he has localized lymphedema right above the wounds. He has had arterial and venous studies already 9/16; punched-out mirror-image wounds on his bilateral lateral mid calfs. We have 1 closed on the left lateral the other smaller. For the first time today the areas on the right lateral actually looks some better. I did a biopsy of 1 of these last time although we still do not have this result. HOWEVER he arrives in clinic today complaining of chest pain overnight which felt like a brick on his chest. He also had 2 black watery bowel movements. He does not have nausea or vomiting. He is not describing abdominal pain. He has no prior history of diarrhea heartburn etc. 9/30; sent this patient to the ER on 9/16. 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 He is on twice daily PPIs Protonix. His admission hemoglobin was exceptionally low at 5. 8.. Discharged at 8.1. 4 unit transfusion. 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 His diuretic dose was actually decreased because of elevated creatinine currently at torsemide 20 mg not felt to be a Coumadin candidate. Patient states he feels well. The areas on his left leg are healed. The areas on the right look better. These have been very refractory wounds. He does not have stockings we will make arrangements for this today 10/7; the areas on the left leg are healed we will transition him into his 20/30 stocking today. Continued improvement in the areas on the right lateral leg 10/21; the areas on the left leg remain healed at this 2-week follow-up however there is  a history that he developed some drainage from one of the wound sites with home health therefore going ahead and wrapping him. He was supposed to be on 20/30 stockings although the history here is vague and I am not sure that this represents a stocking failure or not. We have been wrapping his right leg with the one remaining wound is the distal wound. We have been using silver collagen 10/28 the patient only has 1 wound remaining on the right lower calf. This still has 3 mm of depth which is unchanged but the surface area is smaller. The superior wound on the right lateral is closed.  The 2 wounds on the left remain closed and he is using his compression stocking 11/4 1 remaining wound on the right lower calf. Under illumination not a viable surface we have been using silver collagen we had good effect on the other areas but this 1 appears to be stalled. There is no open area on the left leg he is using his own stocking. 11/11; 1 remaining wound on the right lower calf. Surface of this is a lot better than last week but still requiring debridement I am using Iodoflex but looking forward to changing the primary dressing to perhaps endoform 11/18; 1 remaining wound on the right lower leg, venous insufficiency, vigorous debridement last week/Iodoflex. Depth of the wound is therefore larger but the wound looks clean. Still very gritty material at the surface requiring debridement 12/2; the remaining wound on his right lower leg, venous insufficiency. I changed him to endoform 2 weeks ago. We appear to be making nice progress. Wound is measuring smaller 12/16; he comes in today with one of his two wounds on the left opened infected second area on the left may be threatened as well as the healed one on the right. He has edema in both legs which I think is pitting. He says his weights are stable he has unknown fairly severe cardiomyopathy. His cardiologist is Dr. Brigitte Pulse it does not sound like he is really  been carefully followed 12/23; the areas that I was concerned about on the left look a lot better we put some silver alginate and put him back in compression. It may be the compression that actually does the trick here. The remaining open area we have been using Iodoflex and again this looks a lot better. At our suggestion he went to see his primary who did not adjust his diuretic he is still apparently taking 20 mg of a loop diuretic and "sometimes" 40 mg is basically what the patient stated 1/6; the area on the left is closed once again. We will transition him into his 20/30 stocking from elastic therapy both areas on the right are now open which is a deterioration from last time at which time the only 1 was open but they are very superficial. We have been using Hydrofera [place] under compression 1/27; its been 3 weeks since the patient was here. We transitioned him to a 20/30 stocking on the left leg. Still under 4-layer compression on the right. He comes in today with a new small opening on the left leg but reopening on the right. Massive blistering on the right leg. He has severe systemic fluid volume overload. I have looked over the patient's records. He was seen in 2020 by tele health by cardiology. 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 I think he was supposed to have follow-up but that follow-up never seems to have happened. He does not complain of chest pain or shortness of breath but notes increasing edema not just in his lower legs 2/3; he has new open areas on the bilateral anterior lower legs. I think a lot of this is because of systemic fluid volume overload. The ultrasound I ordered showed ascites and suggestion of cirrhosis. His physical exam continues to suggest right greater than left congestive heart failure. I have him taking 40 mg of Lasix oral daily I have asked him to start weighing himself daily. With the intense efforts  of our nursing staff we have him a cardiology appointment  on Monday or Tuesday of next week I have told him not to miss this. With regards to his wounds I do not think we are going to be able to stop wrapping him until somebody manages to get his fluid volume down 2/10; the areas on his bilateral lateral lower legs actually look better today. He has been taking Lasix 40 mg a day I believe [2 x 20 mg]. He seems to have less edema in his legs and and is a result of this I think he has less edema in his lower legs as well and the wounds appear to be doing better. He comes in today with purulence coming out of the toenail bed of the left great toe it appears that his toenail came off there was noted to be pus I did a culture of this area 2/24; since the patient was last here 2 weeks ago he was admitted to hospital from 2/12 through 2/16 predominantly acute on chronic systolic heart failure. He also has stage IV chronic kidney disease discharge creatinine at 2.71 and on top of this morphology of the liver on ultrasound suggested cirrhosis of unclear etiology. He had 9.4 L of ascites tapped I am unclear whether this was sent for analysis at the time of this dictation. He is currently taking 40 mg of Demadex a day I am a bit surprised that was so low. Nevertheless the wounds on his legs are a lot better everything is closed on the right he still has an open area on the left and then the area on the great toe nail matrix on the left from last time. 10/30/2020 upon evaluation today patient appears to be doing well with regard to his legs other than the fact that they are extremely swollen. He has multiple blisters noted bilateral lower extremities. Apparently the home health nurse came out yesterday unwrapped them and noticed a couple blisters she called 911 and would not rewrap him. There is no evidence of infection whatsoever we did not receive a phone call that we are aware of anyway regarding the fact that  the home health nurse had any concerns at all. Nonetheless the major issue here is that the patient is going to really need to make sure that his fluids under good control but I do not see anything that indicates he needs to go to the hospital right now. There is literally no evidence of infection although fluid is just completely clear. 3/16; patient has home health. Apparently the last time he was here home health did not put compression wraps back on his legs after discovering blisters. By the time he got into see [place] he had had several additional blisters and wounds open up. He was put back in 4 layer compression most of this appears to be healing. He still has small superficial areas on his bilateral lower extremities He also has an area in the left great toe nailbed after lost his toe nail. 3/31; 2-week follow-up. Everything on the right leg is healed he has only has a very small open area remaining on the left leg in one of his original wound sites this should be healed by the next time he is here we have good edema control bilaterally. He removed his toenail last time he was here. Drainage from this we are using topical antibiotics Finally he has increasing ascites which I think is probably cirrhosis rather than heart failure. He said he was going to go to the emergency room. I  suggested that he go to his primary doctor get the primary doctor updated in where this is. If they are going to do paracentesis then that may be able to be done in interventional radiology rather than sitting for hours in the emergency room. He does not look to be in any distress. 4/14; patient returns to clinic. The stockings he had did not maintain skin integrity on the right he has 3 open wounds here which started on blisters. Predictably everything is closed on the left. He tells me that since he was last here he had to go to the emergency room to deal with the ascites they tapped 8 L of fluid. Again if anybody is  aware of the exact etiology of this with certainty the patient is not aware of this. All I know is the ultrasound I did suggested cirrhosis. 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 Objective Constitutional Sitting or standing Blood Pressure is within target range for patient.. Pulse regular and within target range for patient.Marland Kitchen Respirations regular, non-labored and within target range.. Temperature is normal and within the target range for the patient.Marland Kitchen Appears in no distress. Vitals Time Taken: 7:51 AM, Height: 71 in, Weight: 198 lbs, BMI: 27.6, Temperature: 97.5 F, Pulse: 86 bpm, Respiratory Rate: 20 breaths/min, Blood Pressure: 110/69 mmHg. Cardiovascular Pedal pulses are palpable. General Notes: Wound exam; the area on the left leg is closed his edema control is excellent. However he has 3 open areas on the right lateral leg no doubt these were blisters. His edema control is not adequate here. Integumentary (Hair, Skin) Wound #14 status is Healed - Epithelialized. Original cause of wound was Blister. The date acquired was: 10/02/2020. The wound has been in [place] 10 weeks. The wound is located on the Left,Lateral Lower Leg. The wound measures 0cm length x 0cm width x 0cm depth; 0cm^2 area and 0cm^3 volume. There is no tunneling or undermining noted. There is a none present amount of drainage noted. The wound margin is flat and intact. There is no granulation within the wound bed. There is no necrotic tissue within the wound bed. Wound #15 status is Open. Original cause of wound was Gradually Appeared. The date acquired was: 10/09/2020. The wound has been in [place] 9 weeks. The wound is located on the Left T Great. The wound measures 0.7cm length x 1.3cm width x 0.1cm depth; 0.715cm^2 area and 0.071cm^3 volume. There is oe Fat Layer (Subcutaneous Tissue) exposed. There is no tunneling or undermining noted. There is a small  amount of purulent drainage noted. The wound margin is flat and intact. There is no granulation within the wound bed. There is a large (67-100%) amount of necrotic tissue within the wound bed including Adherent Slough. Wound #16R status is Open. Original cause of wound was Gradually Appeared. The date acquired was: 11/13/2020. The wound has been in [place] 4 weeks. The wound is located on the Right,Lateral Lower Leg. The wound measures 1.2cm length x 1.1cm width x 0.1cm depth; 1.037cm^2 area and 0.104cm^3 volume. There is Fat Layer (Subcutaneous Tissue) exposed. There is no tunneling or undermining noted. There is a medium amount of serous drainage noted. The wound margin is flat and intact. There is medium (34-66%) pink granulation within the wound bed. There is a medium (34-66%) amount of necrotic tissue within the wound bed including Adherent Slough. Wound #17 status is Open. Original cause of wound was Blister. The date  acquired was: 12/04/2020. The wound is located on the Right,Proximal,Lateral Lower Leg. The wound measures 1cm length x 0.4cm width x 0.1cm depth; 0.314cm^2 area and 0.031cm^3 volume. There is Fat Layer (Subcutaneous Tissue) exposed. There is no tunneling or undermining noted. There is a medium amount of serous drainage noted. The wound margin is flat and intact. There is large (67-100%) pink granulation within the wound bed. There is a small (1-33%) amount of necrotic tissue within the wound bed including Adherent Slough. Wound #18 status is Open. Original cause of wound was Blister. The date acquired was: 12/04/2020. The wound is located on the Right,Distal,Lateral Lower Leg. The wound measures 2.2cm length x 2cm width x 0.1cm depth; 3.456cm^2 area and 0.346cm^3 volume. There is Fat Layer (Subcutaneous Tissue) exposed. There is no tunneling or undermining noted. There is a medium amount of serous drainage noted. The wound margin is flat and intact. There is large (67-100%) red  granulation within the wound bed. There is no necrotic tissue within the wound bed. Wound #19 status is Open. Original cause of wound was Blister. The date acquired was: 12/04/2020. The wound is located on the Left,Posterior Lower Leg. The wound measures 2.5cm length x 2.5cm width x 0.1cm depth; 4.909cm^2 area and 0.491cm^3 volume. There is Fat Layer (Subcutaneous Tissue) exposed. There is no tunneling or undermining noted. There is a small amount of serous drainage noted. The wound margin is flat and intact. There is large (67-100%) red granulation within the wound bed. There is no necrotic tissue within the wound bed. Assessment Active Problems ICD-10 Chronic venous hypertension (idiopathic) with inflammation of bilateral lower extremity Lymphedema, not elsewhere classified Non-pressure chronic ulcer of other part of left lower leg with fat layer exposed Non-pressure chronic ulcer of other part of right lower leg with fat layer exposed Chronic systolic (congestive) heart failure Abrasion, left foot, subsequent encounter Procedures Wound #16R Pre-procedure diagnosis of Wound #16R is a Venous Leg Ulcer located on the Right,Lateral Lower Leg . There was a Four Layer Compression Therapy Procedure by Rhae Hammock, RN. Post procedure Diagnosis Wound #16R: Same as Pre-Procedure Wound #17 Pre-procedure diagnosis of Wound #17 is a Venous Leg Ulcer located on the Right,Proximal,Lateral Lower Leg . There was a Four Layer Compression Therapy Procedure by Rhae Hammock, RN. Post procedure Diagnosis Wound #17: Same as Pre-Procedure Wound #18 Pre-procedure diagnosis of Wound #18 is a Venous Leg Ulcer located on the Right,Distal,Lateral Lower Leg . There was a Four Layer Compression Therapy Procedure by Rhae Hammock, RN. Post procedure Diagnosis Wound #18: Same as Pre-Procedure Wound #19 Pre-procedure diagnosis of Wound #19 is a Venous Leg Ulcer located on the Left,Posterior Lower Leg .  There was a Four Layer Compression Therapy Procedure by Rhae Hammock, RN. Post procedure Diagnosis Wound #19: Same as Pre-Procedure Plan Follow-up Appointments: Return Appointment in 2 weeks. - Thursday Bathing/ Shower/ Hygiene: May shower with protection but do not get wound dressing(s) wet. May shower and wash wound with soap and water. - with dressing changes only. Edema Control - Lymphedema / SCD / Other: Elevate legs to the level of the heart or above for 30 minutes daily and/or when sitting, a frequency of: - 3-4 times a day throughout the day. Avoid standing for long periods of time. Exercise regularly Moisturize legs daily. - right leg every night. Compression stocking or Garment 20-30 mm/Hg pressure to: - apply in the morning and remove at night to right leg only for now. Home Health: No change in wound care  orders this week; continue Home Health for wound care. May utilize formulary equivalent dressing for wound [place] orders unless otherwise specified. - [facility] home health twice a week. WOUND #15: - [place] Wound Laterality: Left Cleanser: Soap and Water (Home Health) 2 x Per BUL/84 Days Discharge Instructions: May shower and wash wound with dial antibacterial soap and water prior to dressing change. Topical: Mupirocin Ointment 2 x Per Day/15 Days Discharge Instructions: ***In Clinic only***. Apply Mupirocin (Bactroban) under Alignate Ag. Topical: Polysporin (Home Health) 2 x Per Day/15 Days Discharge Instructions: patient to purchase and home health to apply under the alginate Ag, Prim Dressing: KerraCel Ag Gelling Fiber Dressing, 2x2 in (silver alginate) ([place]) 2 x Per Day/15 Days ary Discharge Instructions: Apply silver alginate to wound bed as instructed Secondary Dressing: Woven Gauze Sponges 2x2 in ([place]) 2 x Per Day/15 Days Discharge Instructions: Apply over primary dressing as directed. Secured With: Child psychotherapist,  Sterile 2x75 (in/in) (Home Health) 2 x Per Day/15 Days Discharge Instructions: Secure with stretch gauze as directed. WOUND #16R: - Lower Leg Wound Laterality: Right, Lateral Cleanser: Soap and Water (Home Health) 2 x Per Week/15 Days Discharge Instructions: May shower and wash wound with dial antibacterial soap and water prior to dressing change. Cleanser: Wound Cleanser (Home Health) 2 x Per Week/15 Days Discharge Instructions: Cleanse the wound with wound cleanser prior to applying a clean dressing using gauze sponges, not tissue or cotton balls. Peri-Wound Care: Sween Lotion (Moisturizing lotion) (Home Health) 2 x Per Week/15 Days Discharge Instructions: Apply moisturizing lotion as directed Prim Dressing: KerraCel Ag Gelling Fiber Dressing, 4x5 in (silver alginate) (Home Health) 2 x Per Week/15 Days ary Discharge Instructions: Apply silver alginate to wound bed as instructed Secondary Dressing: Woven Gauze Sponge, Non-Sterile 4x4 in (Home Health) 2 x Per Week/15 Days Discharge Instructions: Apply over primary dressing as directed. Secondary Dressing: ABD Pad, 5x9 (Home Health) 2 x Per Week/15 Days Discharge Instructions: Apply over primary dressing as directed. Com pression Wrap: FourPress (4 layer compression wrap) (Home Health) 2 x Per Week/15 Days Discharge Instructions: Apply four layer compression as directed. WOUND #17: - Lower Leg Wound Laterality: Right, Lateral, Proximal Cleanser: Soap and Water (Home Health) 2 x Per Week/15 Days Discharge Instructions: May shower and wash wound with dial antibacterial soap and water prior to dressing change. Cleanser: Wound Cleanser (Home Health) 2 x Per Week/15 Days Discharge Instructions: Cleanse the wound with wound cleanser prior to applying a clean dressing using gauze sponges, not tissue or cotton balls. Peri-Wound Care: Sween Lotion (Moisturizing lotion) (Home Health) 2 x Per Week/15 Days Discharge Instructions: Apply moisturizing lotion  as directed Prim Dressing: KerraCel Ag Gelling Fiber Dressing, 4x5 in (silver alginate) (Home Health) 2 x Per Week/15 Days ary Discharge Instructions: Apply silver alginate to wound bed as instructed Secondary Dressing: Woven Gauze Sponge, Non-Sterile 4x4 in (Home Health) 2 x Per Week/15 Days Discharge Instructions: Apply over primary dressing as directed. Secondary Dressing: ABD Pad, 5x9 (Home Health) 2 x Per Week/15 Days Discharge Instructions: Apply over primary dressing as directed. Com pression Wrap: FourPress (4 layer compression wrap) (Home Health) 2 x Per Week/15 Days Discharge Instructions: Apply four layer compression as directed. WOUND #18: - Lower Leg Wound Laterality: Right, Lateral, Distal Cleanser: Soap and Water (Home Health) 2 x Per Week/15 Days Discharge Instructions: May shower and wash wound with dial antibacterial soap and water prior to dressing change. Cleanser: Wound Cleanser (Home Health) 2 x Per Week/15  Days Discharge Instructions: Cleanse the wound with wound cleanser prior to applying a clean dressing using gauze sponges, not tissue or cotton balls. Peri-Wound Care: Sween Lotion (Moisturizing lotion) (Home Health) 2 x Per Week/15 Days Discharge Instructions: Apply moisturizing lotion as directed Prim Dressing: KerraCel Ag Gelling Fiber Dressing, 4x5 in (silver alginate) (Home Health) 2 x Per Week/15 Days ary Discharge Instructions: Apply silver alginate to wound bed as instructed Secondary Dressing: Woven Gauze Sponge, Non-Sterile 4x4 in (Home Health) 2 x Per Week/15 Days Discharge Instructions: Apply over primary dressing as directed. Secondary Dressing: ABD Pad, 5x9 (Home Health) 2 x Per Week/15 Days Discharge Instructions: Apply over primary dressing as directed. Com pression Wrap: FourPress (4 layer compression wrap) (Home Health) 2 x Per Week/15 Days Discharge Instructions: Apply four layer compression as directed. WOUND #19: - Lower Leg Wound Laterality:  Left, Posterior Cleanser: Soap and Water (Home Health) 2 x Per Week/15 Days Discharge Instructions: May shower and wash wound with dial antibacterial soap and water prior to dressing change. Cleanser: Wound Cleanser (Home Health) 2 x Per Week/15 Days Discharge Instructions: Cleanse the wound with wound cleanser prior to applying a clean dressing using gauze sponges, not tissue or cotton balls. Peri-Wound Care: Sween Lotion (Moisturizing lotion) (Home Health) 2 x Per Week/15 Days Discharge Instructions: Apply moisturizing lotion as directed Prim Dressing: KerraCel Ag Gelling Fiber Dressing, 4x5 in (silver alginate) (Home Health) 2 x Per Week/15 Days ary Discharge Instructions: Apply silver alginate to wound bed as instructed Secondary Dressing: Woven Gauze Sponge, Non-Sterile 4x4 in (Home Health) 2 x Per Week/15 Days Discharge Instructions: Apply over primary dressing as directed. Secondary Dressing: ABD Pad, 5x9 (Home Health) 2 x Per Week/15 Days Discharge Instructions: Apply over primary dressing as directed. Com pression Wrap: FourPress (4 layer compression wrap) (Home Health) 2 x Per Week/15 Days Discharge Instructions: Apply four layer compression as directed. #1 Silver alginate under 4-layer compression to the right leg 2. There is no open wound on the left leg nevertheless we are going to wrap this 3. The patient is on a fixed income. We are going to start off with new 20/30 below-knee stockings from elastic therapy in [place]. HOWEVER I am not totally convinced that this will be satisfactory. He may need 30/40 below-knee stockings and/or external compression stockings which she is already stated he cannot afford. We have given him his measurements for below-knee stockings and he is going to call elastic therapy 4. I have asked the patient to speak to his primary doctor. He has had recurrent ascites which by my mind has seems to have come on in the last 4 months. 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 I do not know that the fluid is ever been sent for analysis culture,SAG etc. Electronic Signature(s) Signed: 12/11/2020 5:23:41 PM By: Linton Ham MD Signed: 12/11/2020 5:23:41 PM By: Linton Ham MD Entered By: Linton Ham on 12/11/2020 08:37:39 -------------------------------------------------------------------------------- SuperBill Details Patient Name: Date of Service: [name], [name] 12/11/2020 Medical Record Number: [id number] Patient Account Number: [id number] Date of Birth/Sex: Treating RN: 06-11-1941 (80 y.o. [name], [name]., Troy [name] Other Clinician: Referring Provider: Treating Provider/Extender: [name]., [name]: 63 Diagnosis Coding ICD-10 Codes Code Description [phone] Chronic venous hypertension (idiopathic) with inflammation of bilateral lower extremity I89.0 Lymphedema, not elsewhere classified L97.822  Non-pressure chronic ulcer of other part of left lower leg with fat layer exposed L97.812 Non-pressure chronic ulcer of other part of right lower leg with fat layer exposed T11.73 Chronic systolic (congestive) heart failure S90.812D Abrasion, left foot, subsequent encounter Facility Procedures The patient participates with Medicare or their insurance follows the Medicare Facility Guidelines: CPT4 Description Modifier Quantity Code [id number] BILATERAL: Application of multi-layer venous compression system; leg (below knee), including ankle and 1 foot. Physician Procedures : CPT4 Code Description Modifier [id number] - WC PHYS LEVEL 4 - EST PT ICD-10 Diagnosis Description A06.015 Non-pressure chronic ulcer of other part of right lower leg with fat layer exposed I87.323 Chronic venous hypertension (idiopathic) with  inflammation of bilateral lower  extremity Quantity: 1 Electronic Signature(s) Signed: 12/11/2020 5:23:41 PM By: Linton Ham MD Entered By: Linton Ham on 12/11/2020 08:38:08

## 2020-12-16 NOTE — Progress Notes (Signed)
ENDI, LAGMAN ([id number]) Visit Report for 12/11/2020 Arrival Information Details Patient Name: Date of Service: [place] 12/11/2020 8:00 [place] Record Number: [id number] Patient Account Number: [id number] Date of Birth/Sex: Treating RN: 11-12-1940 (80 y.o. [name], Lauren Primary Care Aadyn Buchheit: [name]., [name] Other Clinician: Referring Sunjai Levandoski: Treating Tarell Schollmeyer/Extender: [name], [name] in Treatment: 76 Visit Information History Since Last Visit Added or deleted any medications: No Patient Arrived: Walker Any new allergies or adverse reactions: No Arrival Time: 07:51 Had a fall or experienced change in No Accompanied By: self activities of daily living that may affect Transfer Assistance: None risk of falls: Patient Identification Verified: Yes Signs or symptoms of abuse/neglect since last visito No Secondary Verification Process Completed: Yes Hospitalized since last visit: No Patient Requires Transmission-Based Precautions: No Implantable device outside of the clinic excluding No Patient Has Alerts: Yes cellular tissue based products placed in the center Patient Alerts: Patient on Blood Thinner since last visit: Has Dressing in Place as Prescribed: Yes Pain Present Now: No Electronic Signature(s) Signed: 12/11/2020 4:03:05 PM By: Sandre Kitty Entered By: Sandre Kitty on 12/11/2020 07:51:37 -------------------------------------------------------------------------------- Compression Therapy Details Patient Name: Date of Service: [name] 12/11/2020 8:00 [place] Record Number: [id number] Patient Account Number: [id number] Date of Birth/Sex: Treating RN: 1940-12-04 (80 y.o. Erie Noe Primary Care Jocelynne Duquette: [name]., [name] Other Clinician: Referring Jaquita Bessire: Treating Cannan Beeck/Extender: [name], [name] in Treatment: 61 Compression Therapy Performed  for Wound Assessment: Wound #16R Right,Lateral Lower Leg Performed By: Clinician Rhae Hammock, RN Compression Type: Four Layer Post Procedure Diagnosis Same as Pre-procedure Electronic Signature(s) Signed: 12/11/2020 5:45:48 PM By: Rhae Hammock RN Entered By: Rhae Hammock on 12/11/2020 08:27:51 -------------------------------------------------------------------------------- Compression Therapy Details Patient Name: Date of Service: [place] 12/11/2020 8:00 A M Medical Record Number: [id number] Patient Account Number: [id number] Date of Birth/Sex: Treating RN: 1941/02/13 (80 y.o. Erie Noe Primary Care Stephanee Barcomb: [name]., [name] Other Clinician: Referring Sadee Osland: Treating Shine Scrogham/Extender: [name], [name] in Treatment: 61 Compression Therapy Performed for Wound Assessment: Wound #17 Right,Proximal,Lateral Lower Leg Performed By: Clinician Rhae Hammock, RN Compression Type: Four Layer Post Procedure Diagnosis Same as Pre-procedure Electronic Signature(s) Signed: 12/11/2020 5:45:48 PM By: Rhae Hammock RN Entered By: Rhae Hammock on 12/11/2020 08:27:51 -------------------------------------------------------------------------------- Compression Therapy Details Patient Name: Date of Service: [name] 12/11/2020 8:00 A M Medical Record Number: [id number] Patient Account Number: [id number] Date of Birth/Sex: Treating RN: [birth year] (80 y.o. Erie Noe Primary Care Alka Falwell: [name]., [name] Other Clinician: Referring Pooja Camuso: Treating Tayvin Preslar/Extender: [name], [name] in Treatment: 61 Compression Therapy Performed for Wound Assessment: Wound #18 Right,Distal,Lateral Lower Leg Performed By: Clinician Rhae Hammock, RN Compression Type: Four Layer Post Procedure Diagnosis Same as Pre-procedure Electronic Signature(s) Signed: 12/11/2020  5:45:48 PM By: Rhae Hammock RN Entered By: Rhae Hammock on 12/11/2020 08:27:51 -------------------------------------------------------------------------------- Compression Therapy Details Patient Name: Date of Service: [place] 12/11/2020 8:00 A M Medical Record Number: [id number] Patient Account Number: [id number] Date of Birth/Sex: Treating RN: 11/26/1940 (80 y.o. [name], Lauren Primary Care Shadara Lopez: [name]., [name] Other Clinician: Referring Shanira Tine: Treating Klein Willcox/Extender: [name], [name] in Treatment: 61 Compression Therapy Performed for Wound Assessment: Wound #19 Left,Posterior Lower Leg Performed  By: Clinician Rhae Hammock, RN Compression Type: Four Layer Post Procedure Diagnosis Same as Pre-procedure Electronic Signature(s) Signed: 12/11/2020 5:45:48 PM By: Rhae Hammock RN Signed: 12/11/2020 5:45:48 PM By: Rhae Hammock RN Entered By: Rhae Hammock on 12/11/2020 08:27:51 -------------------------------------------------------------------------------- Encounter Discharge Information Details Patient Name: Date of Service: [place] Regino Ramirez 12/11/2020 8:00 [place] Record Number: [id number] Patient Account Number: [id number] Date of Birth/Sex: Treating RN: 20-Jul-1941 (80 y.o. [name] Primary Care Trinnity Breunig: [name]., [name] Other Clinician: Referring Barkley Kratochvil: Treating Timesha Cervantez/Extender: [name], [name] in Treatment: 54 Encounter Discharge Information Items Discharge Condition: Stable Ambulatory Status: Walker Discharge Destination: Home Transportation: Other Accompanied By: self Schedule Follow-up Appointment: Yes Clinical Summary of Care: Patient Declined Notes SCAT Electronic Signature(s) Signed: 12/11/2020 1:41:00 PM By: Baruch Gouty RN, BSN Entered By: Baruch Gouty on 12/11/2020  08:58:15 -------------------------------------------------------------------------------- Lower Extremity Assessment Details Patient Name: Date of Service: [place] 12/11/2020 8:00 A M Medical Record Number: [id number] Patient Account Number: [id number] Date of Birth/Sex: Treating RN: [birth year] (80 y.o. [name], Lauren Primary Care Chrissy Ealey: [name]., [name] Other Clinician: Referring Abhiram Criado: Treating Jashawna Reever/Extender: [name], [name] in Treatment: 61 Edema Assessment Assessed: [Left: No] [Right: No] Edema: [Left: Yes] [Right: Yes] Calf Left: Right: Point of Measurement: 53 cm From Medial Instep 34.8 cm 34 cm Ankle Left: Right: Point of Measurement: 14 cm From Medial Instep 23.4 cm 24 cm Vascular Assessment Pulses: Dorsalis Pedis Palpable: [Left:No] [Right:No] Electronic Signature(s) Signed: 12/11/2020 4:03:05 PM By: Sandre Kitty Signed: 12/11/2020 5:45:48 PM By: Rhae Hammock RN Entered By: Sandre Kitty on 12/11/2020 08:11:24 -------------------------------------------------------------------------------- Multi Wound Chart Details Patient Name: Date of Service: [place] Faith [place] 12/11/2020 8:00 A M Medical Record Number: [id number] Patient Account Number: [id number] Date of Birth/Sex: Treating RN: [birth year] (80 y.o. [name], Lauren Primary Care Sudeep Scheibel: [name]., [name] Other Clinician: Referring Keena Dinse: Treating Yuki Brunsman/Extender: [name], [name] in Treatment: 43 Vital Signs Height(in): 71 Pulse(bpm): 12 Weight(lbs): 198 Blood Pressure(mmHg): 110/69 Body Mass Index(BMI): 28 Temperature(F): 97.5 Respiratory Rate(breaths/min): 20 Photos: [14:No Photos Left, Lateral Lower Leg] [15:No Photos Left T Great oe] [16R:No Photos Right, Lateral Lower Leg] Wound Location: [14:Blister] [15:Gradually Appeared] [16R:Gradually Appeared] Wounding Event: [14:Venous Leg  Ulcer] [15:Infection - not elsewhere classified Venous Leg Ulcer] Primary Etiology: [14:N/A] [15:N/A] [16R:N/A] Secondary Etiology: [14:Anemia, Congestive Heart Failure,] [15:Anemia, Congestive Heart Failure,] [16R:Anemia, Congestive Heart Failure,] Comorbid History: [14:Hypertension, Peripheral Venous Disease, End Stage Renal Disease 10/02/2020] [15:Hypertension, Peripheral Venous Disease, End Stage Renal Disease Disease, End Stage Renal Disease 10/09/2020] [16R:Hypertension, Peripheral Venous  11/13/2020] Date Acquired: [14:10] [15:9] [16R:4] Weeks of Treatment: [14:Healed - Epithelialized] [15:Open] [16R:Open] Wound Status: [14:No] [15:No] [16R:Yes] Wound Recurrence: [14:0x0x0] [15:0.7x1.3x0.1] [16R:1.2x1.1x0.1] Measurements L x W x D (cm) [14:0] [15:0.715] [16R:1.037] A (cm) : rea [14:0] [15:0.071] [16R:0.104] Volume (cm) : [14:100.00%] [15:54.50%] [16R:5.70%] % Reduction in Area: [14:100.00%] [15:54.80%] [16R:5.50%] % Reduction in Volume: [14:Full Thickness Without Exposed] [15:Full Thickness Without Exposed] [16R:Full Thickness Without Exposed] Classification: [14:Support Structures None Present] [15:Support Structures Small] [16R:Support Structures Medium] Exudate Amount: [14:N/A] [15:Purulent] [16R:Serous] Exudate Type: [14:N/A] [15:yellow, brown, green] [16R:amber] Exudate Color: [14:Flat and Intact] [15:Flat and Intact] [16R:Flat and Intact] Wound Margin: [14:None Present (0%)] [15:None Present (0%)] [16R:Medium (34-66%)] Granulation Amount: [14:N/A] [15:N/A] [16R:Pink] Granulation Quality: [14:None Present (0%)] [15:Large (67-100%)] [16R:Medium (34-66%)] Necrotic Amount: [14:Fascia: No] [15:Fat Layer (Subcutaneous Tissue): Yes  Fat Layer (Subcutaneous Tissue): Yes] Exposed Structures: [14:Fat Layer (Subcutaneous Tissue): No Tendon: No Muscle: No Joint: No Bone: No Large (67-100%)] [15:Fascia: No Tendon: No Muscle: No Joint: No Bone: No Small (1-33%)] [16R:Fascia: No Tendon: No  Muscle: No Joint: No Bone: No Small (1-33%)] Epithelialization: [14:N/A] [15:N/A] [16R:Compression Therapy] Wound Number: [id number]  Photos: No Photos No Photos No Photos Right, Proximal, Lateral Lower Leg Right, Distal, Lateral Lower Leg Left, Posterior Lower Leg Wound Location: Blister Blister Blister Wounding Event: Venous Leg Ulcer Venous Leg Ulcer Venous Leg Ulcer Primary Etiology: Lymphedema Lymphedema Lymphedema Secondary Etiology: Anemia, Congestive Heart Failure, Anemia, Congestive Heart Failure, Anemia, Congestive Heart Failure, Comorbid History: Hypertension, Peripheral Venous Hypertension, Peripheral Venous Hypertension, Peripheral Venous Disease, End Stage Renal Disease Disease, End Stage Renal Disease Disease, End Stage Renal Disease 12/04/2020 12/04/2020 12/04/2020 Date Acquired: 0 0 0 Weeks of Treatment: Open Open Open Wound Status: No No No Wound Recurrence: 1x0.4x0.1 2.2x2x0.1 2.5x2.5x0.1 Measurements L x W x D (cm) 0.314 3.456 4.909 A (cm) : rea 0.031 0.346 0.491 Volume (cm) : N/A N/A N/A % Reduction in Area: N/A N/A N/A % Reduction in Volume: Full Thickness Without Exposed Full Thickness Without Exposed Full Thickness Without Exposed Classification: Support Structures Support Structures Support Structures Medium Medium Small Exudate Amount: Serous Serous Serous Exudate Type: Geophysical data processor Exudate Color: Flat and Intact Flat and Intact Flat and Intact Wound Margin: Large (67-100%) Large (67-100%) Large (67-100%) Granulation Amount: Pink Red Red Granulation Quality: Small (1-33%) None Present (0%) None Present (0%) Necrotic Amount: Fat Layer (Subcutaneous Tissue): Yes Fat Layer (Subcutaneous Tissue): Yes Fat Layer (Subcutaneous Tissue): Yes Exposed Structures: Fascia: No Fascia: No Fascia: No Tendon: No Tendon: No Tendon: No Muscle: No Muscle: No Muscle: No Joint: No Joint: No Joint: No Bone: No Bone: No Bone: No Small (1-33%) None  Small (1-33%) Epithelialization: Compression Therapy Compression Therapy Compression Therapy Procedures Performed: Treatment Notes Electronic Signature(s) Signed: 12/11/2020 5:23:41 PM By: Linton Ham MD Signed: 12/11/2020 5:45:48 PM By: Rhae Hammock RN Entered By: Linton Ham on 12/11/2020 44:96:75 -------------------------------------------------------------------------------- Multi-Disciplinary Care Plan Details Patient Name: Date of Service: [place] 12/11/2020 8:00 A M Medical Record Number: [id number] Patient Account Number: [id number] Date of Birth/Sex: Treating RN: 1940/10/03 (80 y.o. Erie Noe Primary Care Lonnette Shrode: [name]., [name] Other Clinician: Referring Murlene Revell: Treating Arvell Pulsifer/Extender: [name], [name] in Treatment: 41 Multidisciplinary Care Plan reviewed with physician Active Inactive Venous Leg Ulcer Nursing Diagnoses: Knowledge deficit related to disease process and management Potential for venous Insuffiency (use before diagnosis confirmed) Goals: Patient will maintain optimal edema control Date Initiated: 05/01/2020 Target Resolution Date: 01/23/2021 Goal Status: Active Interventions: Assess peripheral edema status every visit. Compression as ordered Provide education on venous insufficiency Notes: Electronic Signature(s) Signed: 12/11/2020 5:45:48 PM By: Rhae Hammock RN Entered By: Rhae Hammock on 12/11/2020 08:13:34 -------------------------------------------------------------------------------- Pain Assessment Details Patient Name: Date of Service: [place] 12/11/2020 8:00 A M Medical Record Number: [id number] Patient Account Number: [id number] Date of Birth/Sex: Treating RN: [birth year] (80 y.o. Erie Noe Primary Care Baley Lorimer: [name]., [name] Other Clinician: Referring Bhargav Barbaro: Treating Ervin Hensley/Extender: [name], RO  [name] in Treatment: 9 Active Problems Location of Pain Severity and Description of Pain Patient Has Paino No Site Locations Pain Management and Medication Current Pain Management: Electronic Signature(s) Signed: 12/11/2020 4:03:05 PM By: Sandre Kitty Signed: 12/11/2020 5:45:48 PM By: Rhae Hammock  RN Entered By: Sandre Kitty on 12/11/2020 07:51:59 -------------------------------------------------------------------------------- Patient/Caregiver Education Details Patient Name: Date of Service: [place] [facility] 4/14/2022andnbsp8:00 [place] Record Number: [id number] Patient Account Number: [id number] Date of Birth/Gender: Treating RN: 04-24-1941 (80 y.o. Erie Noe Primary Care Physician: [name]., [name] Other Clinician: Referring Physician: Treating Physician/Extender: [name], [name] in Treatment: 77 Education Assessment Education Provided To: Patient Education Topics Provided Venous: Methods: Explain/Verbal Responses: State content correctly Electronic Signature(s) Signed: 12/11/2020 5:45:48 PM By: Rhae Hammock RN Entered By: Rhae Hammock on 12/11/2020 08:13:46 -------------------------------------------------------------------------------- Wound Assessment Details Patient Name: [place] of Service: [place] 12/11/2020 8:00 A M Medical Record Number: [id number] Patient Account Number: [id number] Date of Birth/Sex: Treating RN: 07-28-1941 (80 y.o. [name], Lauren Primary Care Tylar Merendino: [name]., [name] Other Clinician: Referring Rodarius Kichline: Treating Tobiah Celestine/Extender: [name], [name] in Treatment: 61 Wound Status Wound Number: 14 Primary Venous Leg Ulcer Etiology: Wound Location: Left, Lateral Lower Leg Wound Healed - Epithelialized Wounding Event: Blister Status: Date Acquired: 10/02/2020 Comorbid Anemia, Congestive Heart Failure, Hypertension,  Peripheral Weeks Of Treatment: 10 History: Venous Disease, End Stage Renal Disease Clustered Wound: No Wound Measurements Length: (cm) Width: (cm) Depth: (cm) Area: (cm) Volume: (cm) 0 % Reduction in Area: 100% 0 % Reduction in Volume: 100% 0 Epithelialization: Large (67-100%) 0 Tunneling: No 0 Undermining: No Wound Description Classification: Full Thickness Without Exposed Support Structures Wound Margin: Flat and Intact Exudate Amount: None Present Foul Odor After Cleansing: No Slough/Fibrino No Wound Bed Granulation Amount: None Present (0%) Exposed Structure Necrotic Amount: None Present (0%) Fascia Exposed: No Fat Layer (Subcutaneous Tissue) Exposed: No Tendon Exposed: No Muscle Exposed: No Joint Exposed: No Bone Exposed: No Electronic Signature(s) Signed: 12/11/2020 4:03:05 PM By: Sandre Kitty Signed: 12/11/2020 5:45:48 PM By: Rhae Hammock RN Entered By: Sandre Kitty on 12/11/2020 08:03:19 -------------------------------------------------------------------------------- Wound Assessment Details Patient Name: [place] of Service: [place] 12/11/2020 8:00 A M Medical Record Number: [id number] Patient Account Number: [id number] Date of Birth/Sex: Treating RN: Mar 24, 1941 (80 y.o. [name], Lauren Primary Care Jerusalem Wert: [name]., [name] Other Clinician: Referring Xavier Fournier: Treating Aeliana Spates/Extender: [name], [name] in Treatment: 61 Wound Status Wound Number: 15 Primary Infection - not elsewhere classified Etiology: Wound Location: Left T Great oe Wound Open Wounding Event: Gradually Appeared Status: Date Acquired: 10/09/2020 Comorbid Anemia, Congestive Heart Failure, Hypertension, Peripheral Weeks Of Treatment: 9 History: Venous Disease, End Stage Renal Disease Clustered Wound: No Photos Wound Measurements Length: (cm) 0.7 Width: (cm) 1.3 Depth: (cm) 0.1 Area: (cm) 0.715 Volume: (cm) 0.071 %  Reduction in Area: 54.5% % Reduction in Volume: 54.8% Epithelialization: Small (1-33%) Tunneling: No Undermining: No Wound Description Classification: Full Thickness Without Exposed Support Structures Wound Margin: Flat and Intact Exudate Amount: Small Exudate Type: Purulent Exudate Color: yellow, brown, green Foul Odor After Cleansing: No Slough/Fibrino Yes Wound Bed Granulation Amount: None Present (0%) Exposed Structure Necrotic Amount: Large (67-100%) Fascia Exposed: No Necrotic Quality: Adherent Slough Fat Layer (Subcutaneous Tissue) Exposed: Yes Tendon Exposed: No Muscle Exposed: No Joint Exposed: No Bone Exposed: No Treatment Notes Wound #15 (Toe Great) Wound Laterality: Left Cleanser Soap and Water Discharge Instruction: May shower and wash wound with dial antibacterial soap and water prior to dressing change. Peri-Wound Care Topical Mupirocin Ointment Discharge Instruction: ***In Clinic only***. Apply Mupirocin (Bactroban) under Alignate Ag. Polysporin Discharge Instruction:  patient to purchase and home health to apply under the alginate Ag, Primary Dressing KerraCel Ag Gelling Fiber Dressing, 2x2 in (silver alginate) Discharge Instruction: Apply silver alginate to wound bed as instructed Secondary Dressing Woven Gauze Sponges 2x2 in Discharge Instruction: Apply over primary dressing as directed. Secured With Conforming Stretch Gauze Bandage, Sterile 2x75 (in/in) Discharge Instruction: Secure with stretch gauze as directed. Compression Wrap Compression Stockings Add-Ons Electronic Signature(s) Signed: 12/11/2020 5:45:48 PM By: Rhae Hammock RN Signed: 12/16/2020 8:07:32 AM By: Sandre Kitty Previous Signature: 12/11/2020 4:03:05 PM Version By: Sandre Kitty Entered By: Sandre Kitty on 12/11/2020 16:28:55 -------------------------------------------------------------------------------- Wound Assessment Details Patient Name: Date of Service: [place] 12/11/2020 8:00 A M Medical Record Number: [id number] Patient Account Number: [id number] Date of Birth/Sex: Treating RN: [birth year] (80 y.o. [name], Lauren Primary Care Renessa Wellnitz: [name]., [name] Other Clinician: Referring Martyn Timme: Treating Chosen Garron/Extender: [name], [name] in Treatment: 61 Wound Status Wound Number: [id number] Primary Venous Leg Ulcer Etiology: Wound Location: Right, Lateral Lower Leg Wound Open Wounding Event: Gradually Appeared Status: Date Acquired: 11/13/2020 Comorbid Anemia, Congestive Heart Failure, Hypertension, Peripheral Weeks Of Treatment: 4 History: Venous Disease, End Stage Renal Disease Clustered Wound: No Photos Wound Measurements Length: (cm) 1.2 Width: (cm) 1.1 Depth: (cm) 0.1 Area: (cm) 1.037 Volume: (cm) 0.104 % Reduction in Area: 5.7% % Reduction in Volume: 5.5% Epithelialization: Small (1-33%) Tunneling: No Undermining: No Wound Description Classification: Full Thickness Without Exposed Support Structures Wound Margin: Flat and Intact Exudate Amount: Medium Exudate Type: Serous Exudate Color: amber Foul Odor After Cleansing: No Slough/Fibrino No Wound Bed Granulation Amount: Medium (34-66%) Exposed Structure Granulation Quality: Pink Fascia Exposed: No Necrotic Amount: Medium (34-66%) Fat Layer (Subcutaneous Tissue) Exposed: Yes Necrotic Quality: Adherent Slough Tendon Exposed: No Muscle Exposed: No Joint Exposed: No Bone Exposed: No Treatment Notes Wound #16R (Lower Leg) Wound Laterality: Right, Lateral Cleanser Soap and Water Discharge Instruction: May shower and wash wound with dial antibacterial soap and water prior to dressing change. Wound Cleanser Discharge Instruction: Cleanse the wound with wound cleanser prior to applying a clean dressing using gauze sponges, not tissue or cotton balls. Peri-Wound Care Sween Lotion (Moisturizing lotion) Discharge Instruction:  Apply moisturizing lotion as directed Topical Primary Dressing KerraCel Ag Gelling Fiber Dressing, 4x5 in (silver alginate) Discharge Instruction: Apply silver alginate to wound bed as instructed Secondary Dressing Woven Gauze Sponge, Non-Sterile 4x4 in Discharge Instruction: Apply over primary dressing as directed. ABD Pad, 5x9 Discharge Instruction: Apply over primary dressing as directed. Secured With Compression Wrap FourPress (4 layer compression wrap) Discharge Instruction: Apply four layer compression as directed. Compression Stockings Add-Ons Electronic Signature(s) Signed: 12/11/2020 5:45:48 PM By: Rhae Hammock RN Signed: 12/16/2020 8:07:32 AM By: Sandre Kitty Previous Signature: 12/11/2020 4:03:05 PM Version By: Sandre Kitty Entered By: Sandre Kitty on 12/11/2020 16:30:15 -------------------------------------------------------------------------------- Wound Assessment Details Patient Name: Date of Service: [place] 12/11/2020 8:00 A M Medical Record Number: [id number] Patient Account Number: [id number] Date of Birth/Sex: Treating RN: [birth year] (79 y.o. [name], Lauren Primary Care Ezell Melikian: [name]., [name] Other Clinician: Referring Dennise Bamber: Treating Tameya Kuznia/Extender: [name], [name] in Treatment: 61 Wound Status Wound Number: 17 Primary Venous Leg Ulcer Etiology: Wound Location: Right, Proximal, Lateral Lower Leg Secondary Lymphedema Wounding Event: Blister Etiology: Date Acquired: 12/04/2020 Wound Open Weeks Of Treatment: 0 Status: Clustered Wound: No Comorbid Anemia, Congestive Heart Failure, Hypertension, Peripheral History: Venous Disease, End  Stage Renal Disease Photos Wound Measurements Length: (cm) 1 Width: (cm) 0.4 Depth: (cm) 0.1 Area: (cm) 0.314 Volume: (cm) 0.031 % Reduction in Area: 0% % Reduction in Volume: 0% Epithelialization: Small (1-33%) Tunneling: No Undermining:  No Wound Description Classification: Full Thickness Without Exposed Support Structures Wound Margin: Flat and Intact Exudate Amount: Medium Exudate Type: Serous Exudate Color: amber Foul Odor After Cleansing: No Slough/Fibrino No Wound Bed Granulation Amount: Large (67-100%) Exposed Structure Granulation Quality: Pink Fascia Exposed: No Necrotic Amount: Small (1-33%) Fat Layer (Subcutaneous Tissue) Exposed: Yes Necrotic Quality: Adherent Slough Tendon Exposed: No Muscle Exposed: No Joint Exposed: No Bone Exposed: No Treatment Notes Wound #17 (Lower Leg) Wound Laterality: Right, Lateral, Proximal Cleanser Soap and Water Discharge Instruction: May shower and wash wound with dial antibacterial soap and water prior to dressing change. Wound Cleanser Discharge Instruction: Cleanse the wound with wound cleanser prior to applying a clean dressing using gauze sponges, not tissue or cotton balls. Peri-Wound Care Sween Lotion (Moisturizing lotion) Discharge Instruction: Apply moisturizing lotion as directed Topical Primary Dressing KerraCel Ag Gelling Fiber Dressing, 4x5 in (silver alginate) Discharge Instruction: Apply silver alginate to wound bed as instructed Secondary Dressing Woven Gauze Sponge, Non-Sterile 4x4 in Discharge Instruction: Apply over primary dressing as directed. ABD Pad, 5x9 Discharge Instruction: Apply over primary dressing as directed. Secured With Compression Wrap FourPress (4 layer compression wrap) Discharge Instruction: Apply four layer compression as directed. Compression Stockings Add-Ons Electronic Signature(s) Signed: 12/11/2020 5:45:48 PM By: Rhae Hammock RN Signed: 12/16/2020 8:07:32 AM By: Sandre Kitty Previous Signature: 12/11/2020 4:03:05 PM Version By: Sandre Kitty Entered By: Sandre Kitty on 12/11/2020 16:29:18 -------------------------------------------------------------------------------- Wound Assessment Details Patient  Name: Date of Service: [place] 12/11/2020 8:00 [place] Record Number: [id number] Patient Account Number: [id number] Date of Birth/Sex: Treating RN: [birth year] (80 y.o. [name], Lauren Primary Care Jaray Boliver: [name]., [name] Other Clinician: Referring Taryn Shellhammer: Treating Bravery Ketcham/Extender: [name], [name] in Treatment: 61 Wound Status Wound Number: 18 Primary Venous Leg Ulcer Etiology: Wound Location: Right, Distal, Lateral Lower Leg Secondary Lymphedema Wounding Event: Blister Etiology: Date Acquired: 12/04/2020 Wound Open Weeks Of Treatment: 0 Status: Clustered Wound: No Comorbid Anemia, Congestive Heart Failure, Hypertension, Peripheral History: Venous Disease, End Stage Renal Disease Photos Wound Measurements Length: (cm) 2.2 Width: (cm) 2 Depth: (cm) 0.1 Area: (cm) 3.456 Volume: (cm) 0.346 % Reduction in Area: 0% % Reduction in Volume: 0% Epithelialization: None Tunneling: No Undermining: No Wound Description Classification: Full Thickness Without Exposed Support Structures Wound Margin: Flat and Intact Exudate Amount: Medium Exudate Type: Serous Exudate Color: amber Foul Odor After Cleansing: No Slough/Fibrino Yes Wound Bed Granulation Amount: Large (67-100%) Exposed Structure Granulation Quality: Red Fascia Exposed: No Necrotic Amount: None Present (0%) Fat Layer (Subcutaneous Tissue) Exposed: Yes Tendon Exposed: No Muscle Exposed: No Joint Exposed: No Bone Exposed: No Treatment Notes Wound #18 (Lower Leg) Wound Laterality: Right, Lateral, Distal Cleanser Soap and Water Discharge Instruction: May shower and wash wound with dial antibacterial soap and water prior to dressing change. Wound Cleanser Discharge Instruction: Cleanse the wound with wound cleanser prior to applying a clean dressing using gauze sponges, not tissue or cotton balls. Peri-Wound Care Sween Lotion (Moisturizing  lotion) Discharge Instruction: Apply moisturizing lotion as directed Topical Primary Dressing KerraCel Ag Gelling Fiber Dressing, 4x5 in (silver alginate) Discharge Instruction: Apply silver alginate to wound bed as instructed Secondary Dressing Woven Gauze Sponge, Non-Sterile 4x4 in Discharge Instruction: Apply over primary  dressing as directed. ABD Pad, 5x9 Discharge Instruction: Apply over primary dressing as directed. Secured With Compression Wrap FourPress (4 layer compression wrap) Discharge Instruction: Apply four layer compression as directed. Compression Stockings Add-Ons Electronic Signature(s) Signed: 12/11/2020 5:45:48 PM By: Rhae Hammock RN Signed: 12/16/2020 8:07:32 AM By: Sandre Kitty Previous Signature: 12/11/2020 4:03:05 PM Version By: Sandre Kitty Entered By: Sandre Kitty on 12/11/2020 16:29:50 -------------------------------------------------------------------------------- Wound Assessment Details Patient Name: Date of Service: [place] 12/11/2020 8:00 [place] Record Number: [id number] Patient Account Number: [id number] Date of Birth/Sex: Treating RN: 10-Jun-1941 (80 y.o. [name], Lauren Primary Care Presly Steinruck: [name]., [name] Other Clinician: Referring Essica Kiker: Treating Isador Castille/Extender: [name], [name] in Treatment: 61 Wound Status Wound Number: 19 Primary Venous Leg Ulcer Etiology: Wound Location: Left, Posterior Lower Leg Secondary Lymphedema Wounding Event: Blister Etiology: Date Acquired: 12/04/2020 Wound Open Weeks Of Treatment: 0 Status: Clustered Wound: No Comorbid Anemia, Congestive Heart Failure, Hypertension, Peripheral History: Venous Disease, End Stage Renal Disease Photos Wound Measurements Length: (cm) 2.5 Width: (cm) 2.5 Depth: (cm) 0.1 Area: (cm) 4.909 Volume: (cm) 0.491 % Reduction in Area: 0% % Reduction in Volume: 0% Epithelialization: Small  (1-33%) Tunneling: No Undermining: No Wound Description Classification: Full Thickness Without Exposed Support Structures Wound Margin: Flat and Intact Exudate Amount: Small Exudate Type: Serous Exudate Color: amber Foul Odor After Cleansing: No Slough/Fibrino No Wound Bed Granulation Amount: Large (67-100%) Exposed Structure Granulation Quality: Red Fascia Exposed: No Necrotic Amount: None Present (0%) Fat Layer (Subcutaneous Tissue) Exposed: Yes Tendon Exposed: No Muscle Exposed: No Joint Exposed: No Bone Exposed: No Treatment Notes Wound #19 (Lower Leg) Wound Laterality: Left, Posterior Cleanser Soap and Water Discharge Instruction: May shower and wash wound with dial antibacterial soap and water prior to dressing change. Wound Cleanser Discharge Instruction: Cleanse the wound with wound cleanser prior to applying a clean dressing using gauze sponges, not tissue or cotton balls. Peri-Wound Care Sween Lotion (Moisturizing lotion) Discharge Instruction: Apply moisturizing lotion as directed Topical Primary Dressing KerraCel Ag Gelling Fiber Dressing, 4x5 in (silver alginate) Discharge Instruction: Apply silver alginate to wound bed as instructed Secondary Dressing Woven Gauze Sponge, Non-Sterile 4x4 in Discharge Instruction: Apply over primary dressing as directed. ABD Pad, 5x9 Discharge Instruction: Apply over primary dressing as directed. Secured With Compression Wrap FourPress (4 layer compression wrap) Discharge Instruction: Apply four layer compression as directed. Compression Stockings Add-Ons Electronic Signature(s) Signed: 12/11/2020 5:45:48 PM By: Rhae Hammock RN Signed: 12/16/2020 8:07:32 AM By: Sandre Kitty Previous Signature: 12/11/2020 4:03:05 PM Version By: Sandre Kitty Entered By: Sandre Kitty on 12/11/2020 16:30:41 -------------------------------------------------------------------------------- Vitals Details Patient Name: Date of  Service: [place] 12/11/2020 8:00 [place] Record Number: [id number] Patient Account Number: [id number] Date of Birth/Sex: Treating RN: 04-21-1941 (81 y.o. [name], Lauren Primary Care Roshanda Balazs: [name]., [name] Other Clinician: Referring Salisha Bardsley: Treating Shatisha Falter/Extender: [name], [name] in Treatment: 76 Vital Signs Time Taken: 07:51 Temperature (F): 97.5 Height (in): 71 Pulse (bpm): 86 Weight (lbs): 198 Respiratory Rate (breaths/min): 20 Body Mass Index (BMI): 27.6 Blood Pressure (mmHg): 110/69 Reference Range: 80 - 120 mg / dl Electronic Signature(s) Signed: 12/11/2020 4:03:05 PM By: Sandre Kitty Entered By: Sandre Kitty on 12/11/2020 07:51:53

## 2020-12-25 ENCOUNTER — Encounter (HOSPITAL_BASED_OUTPATIENT_CLINIC_OR_DEPARTMENT_OTHER): Payer: Medicare Other | Admitting: Internal Medicine

## 2021-01-01 ENCOUNTER — Other Ambulatory Visit: Payer: Self-pay

## 2021-01-01 ENCOUNTER — Encounter (HOSPITAL_BASED_OUTPATIENT_CLINIC_OR_DEPARTMENT_OTHER): Payer: Medicare Other | Attending: Internal Medicine | Admitting: Internal Medicine

## 2021-01-01 DIAGNOSIS — E1122 Type 2 diabetes mellitus with diabetic chronic kidney disease: Secondary | ICD-10-CM | POA: Insufficient documentation

## 2021-01-01 DIAGNOSIS — I5022 Chronic systolic (congestive) heart failure: Secondary | ICD-10-CM | POA: Insufficient documentation

## 2021-01-01 DIAGNOSIS — I890 Lymphedema, not elsewhere classified: Secondary | ICD-10-CM | POA: Diagnosis not present

## 2021-01-01 DIAGNOSIS — I872 Venous insufficiency (chronic) (peripheral): Secondary | ICD-10-CM | POA: Insufficient documentation

## 2021-01-01 DIAGNOSIS — E1151 Type 2 diabetes mellitus with diabetic peripheral angiopathy without gangrene: Secondary | ICD-10-CM | POA: Diagnosis not present

## 2021-01-01 DIAGNOSIS — I132 Hypertensive heart and chronic kidney disease with heart failure and with stage 5 chronic kidney disease, or end stage renal disease: Secondary | ICD-10-CM | POA: Insufficient documentation

## 2021-01-01 DIAGNOSIS — Z8249 Family history of ischemic heart disease and other diseases of the circulatory system: Secondary | ICD-10-CM | POA: Diagnosis not present

## 2021-01-01 DIAGNOSIS — Z7901 Long term (current) use of anticoagulants: Secondary | ICD-10-CM | POA: Diagnosis not present

## 2021-01-01 DIAGNOSIS — L97521 Non-pressure chronic ulcer of other part of left foot limited to breakdown of skin: Secondary | ICD-10-CM | POA: Diagnosis not present

## 2021-01-01 DIAGNOSIS — D631 Anemia in chronic kidney disease: Secondary | ICD-10-CM | POA: Diagnosis not present

## 2021-01-01 DIAGNOSIS — E11621 Type 2 diabetes mellitus with foot ulcer: Secondary | ICD-10-CM | POA: Insufficient documentation

## 2021-01-01 DIAGNOSIS — N186 End stage renal disease: Secondary | ICD-10-CM | POA: Insufficient documentation

## 2021-01-01 DIAGNOSIS — Z87891 Personal history of nicotine dependence: Secondary | ICD-10-CM | POA: Diagnosis not present

## 2021-01-01 DIAGNOSIS — L97112 Non-pressure chronic ulcer of right thigh with fat layer exposed: Secondary | ICD-10-CM | POA: Diagnosis not present

## 2021-01-01 DIAGNOSIS — L97822 Non-pressure chronic ulcer of other part of left lower leg with fat layer exposed: Secondary | ICD-10-CM | POA: Insufficient documentation

## 2021-01-01 DIAGNOSIS — L97812 Non-pressure chronic ulcer of other part of right lower leg with fat layer exposed: Secondary | ICD-10-CM | POA: Diagnosis not present

## 2021-01-01 NOTE — Progress Notes (Signed)
[name] ([id number]) Visit Report for 01/01/2021 HPI Details Patient Name: Date of Service: [place] 01/01/2021 8:00 A M Medical Record Number: [id number] Patient Account Number: [id number] Date of Birth/Sex: Treating RN: June 08, 1941 (80 y.o. [name], [name] Primary Care Provider: Frederik Patton., [name] Other Clinician: Referring Provider: Treating Provider/Extender: [name]., [name] in Treatment: 8 History of Present Illness HPI Description: ADMISSION 10/11/2019 This is a 80 year old man with a history of a severe cardiomyopathy with an ejection fraction of about 20%, chronic renal failure stage III. He is listed as a type II diabetic in epic although the patient denies this. He also has a history of PVD. He states for the last month he has had wounds on his bilateral lower extremities that started off as blisters which denuded. He has areas on the left lateral calf and 2 on the right lateral. He has an area on the left first met head which he did not know was there he we identified this on intake. He has been using Silvadene cream provided by his primary care physician but he is complaining that this burns. 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 He was acutely ill at that time complicating GI bleed. ABIs in our clinic were 1.16 on the right and 1.13 on the left 2/25; the patient comes in with his areas on the left lateral and right lateral calf. There is also an area over the left first MTP bunion deformity. We have been using Sorbact. His edema control is fairly good 3/4; left lateral and  right lateral calf. Most of his wounds are in the same position tightly adherent nonviable debris. On the right we debrided the superior wound on the left both wounds. The area on his bunion over the left first MTP medially is I think just about closed. We have been using Sorbact without a lot of success changed to Iodoflex under compression 3/11; left lateral and right lateral calf perhaps minor improvement in the surface condition. We have been using Iodoflex. The area over the bunion of the left first MTP has closed over 3/18; left lateral and right lateral calf not much improvement. We have been using Iodoflex. Aggressive debridement last week. 3/25; left lateral and right lateral calf. We have been using Iodoflex. There is some improvement in the superior area on the right and 2 on the lateral left although there is still a lot of debris on the surface. The inferior area on the right is still a completely nonviable surface. 4/8; left lateral and right lateral calfs. Essentially mirror-image looking wounds 2 wounds on each side in close juxtaposition we have been using Iodoflex with some improvement in the very adherent fibrinous debris but not a lot. The patient has arterial studies next Wednesday morning and venous studies next Thursday morning. I have been avoiding any further aggressive debridement until we see the arterial study results. His ABIs were fairly good in this clinic and is dorsalis pedis pulses are palpable but the wound beds are pale. 4/15; left lateral and right lateral calfs. Essentially mirror-image wounds with 2 wounds on each side in close juxtaposition  but with rims of separating normal tissue. We have been using Iodoflex. The base of the wounds has been cleaning up quite nicely ARTERIAL STUDIES were done showing the patient had an ABI on the right of 1.27 with triphasic waveforms and a TBI of 0.90 on the left triphasic waveforms with an ABI of 1.28 and a TBI of 0.87. No  evidence of arterial disease VENOUS REFLUX STUDIES; were done yesterday we do not have this report yet. 4/23; VENOUS REFLUX STUDIES did not show any significant reflux right lower extremity. No evidence of a DVT He did have significant reflux in the common . femoral vein on the left but nothing else was listed as significant. He did not have a DVT. We have been using Iodoflex under compression his wounds are making progress. 4/29; improvements in the wound surface continue. We have been using Iodoflex. He has a 20% out-of-pocket co-pay for Apligraf which is unfortunate. We changed him to Sorbact today 5/6; started on Sorbact last week. Better looking wound surface but not much change in dimensions. 01/24/20-Patient is back at 3 weeks, wound surfaces are about the same but very minimal slough on the right leg wounds, however there is blistering on both legs adjacent to the wounds, patient denies any other symptoms or new symptoms. His studies have been reviewed and do not indicate any significant arterial or venous disease. But he is attending once in 3 weeks with home health changing in between 6/3; patient has 4 wounds 2 on each side of his lateral lower legs. These wounds are somewhat improved. He apparently arrived in clinic last week with a large blister on the right lateral lower leg that had denuded into the wound. They changed to silver alginate. Still under compression. He has straight Medicare and unfortunately has an unlimited co-pay for advanced treatment options. 6/10; his original 4 wounds are all better especially on the left lateral lower leg. 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 6/24; blister on the right lateral lower leg has healed. There is no additional wounds. The traumatic left second toe is also closed. The 4 that he has are all better 7/1; all of the patient's wounds are smaller except for the proximal one on the left lateral calf. We are using silver collagen 7/22; patient has home health. Relatively refractory wounds on the lateral part of both mid calfs. Each of them 2 wounds. The areas on the left are doing much better in fact the distal 1 looks like it is on his way to closure. The right required debridement with adherent fibrinous debris under illumination. We have been using silver collagen. Previous arterial studies were within normal limits he also had venous reflux studies 04/03/2020 upon evaluation today patient appears to be doing excellent with regard to his lower extremity ulcers. He is going to require a little bit of debridement on the wounds on the right especially in order to clear away some biofilm and slough but this is minimal and overall he seems to be doing quite well. 8/19; the patient's wounds on the left lateral lower leg just above closed. However the 2  on the lateral part of the right have a nonviable necrotic surface. This is disappointing. There is also no change in dimensions. He has had arterial studies as well as venous reflux studies 8/26; very disappointing today. Even the more superficial areas on the left are about the same as last week. Still 2 punched-out areas completely unchanged on the right. We have been using silver collagen really making no progress. 9/2; changed to Iodoflex last week better looking wound surfaces especially on the right although they are deep and punched out. The areas on the left are more superficial open 1 of these is almost fully epithelialized although it has been this way for at least 2 weeks. He has a 20% co-pay [Medicare only] unaffordable for a skin  substitute. Had some thought about a snap VAC on the deep areas on the right leg we will try to put this through in the insurance 9/9; punched-out wounds on the bilateral lateral lower extremities. We have been operating as if the these were chronic venous wounds with secondary lymphedema. The areas on the left have been doing well in fact one of them is closed over. We have had no improvement in the areas on the right we have been using Iodoflex to help with surface debridement. The areas on the right are deeper wider. I do not see any evidence of infection. Home health is not been putting the wraps on high enough he has localized lymphedema right above the wounds. He has had arterial and venous studies already 9/16; punched-out mirror-image wounds on his bilateral lateral mid calfs. We have 1 closed on the left lateral the other smaller. For the first time today the areas on the right lateral actually looks some better. I did a biopsy of 1 of these last time although we still do not have this result. HOWEVER he arrives in clinic today complaining of chest pain overnight which felt like a brick on his chest. He also had 2 black watery bowel movements. He does not have nausea or vomiting. He is not describing abdominal pain. He has no prior history of diarrhea heartburn etc. 9/30; sent this patient to the ER on 9/16. 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 He is on twice daily PPIs Protonix. His admission hemoglobin was exceptionally low at 5. 8.. Discharged at 8.1. 4 unit transfusion. 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 His diuretic dose was actually decreased because of elevated creatinine currently at torsemide 20 mg not felt to be a Coumadin candidate. Patient states he feels well. The areas on his left leg are healed. The areas on the right look better. These have been very refractory  wounds. He does not have stockings we will make arrangements for this today 10/7; the areas on the left leg are healed we will transition him into his 20/30 stocking today. Continued improvement in the areas on the right lateral leg 10/21; the areas on the left leg remain healed at this 2-week follow-up however there is a history that he developed some drainage from one of the wound sites with home health therefore going ahead and wrapping him. He was supposed to be on 20/30 stockings although the history here is vague and I am not sure that this represents a stocking failure or not. We have been wrapping his right leg with the one remaining wound is the distal wound. We  have been using silver collagen 10/28 the patient only has 1 wound remaining on the right lower calf. This still has 3 mm of depth which is unchanged but the surface area is smaller. The superior wound on the right lateral is closed. The 2 wounds on the left remain closed and he is using his compression stocking 11/4 1 remaining wound on the right lower calf. Under illumination not a viable surface we have been using silver collagen we had good effect on the other areas but this 1 appears to be stalled. There is no open area on the left leg he is using his own stocking. 11/11; 1 remaining wound on the right lower calf. Surface of this is a lot better than last week but still requiring debridement I am using Iodoflex but looking forward to changing the primary dressing to perhaps endoform 11/18; 1 remaining wound on the right lower leg, venous insufficiency, vigorous debridement last week/Iodoflex. Depth of the wound is therefore larger but the wound looks clean. Still very gritty material at the surface requiring debridement 12/2; the remaining wound on his right lower leg, venous insufficiency. I changed him to endoform 2 weeks ago. We appear to be making nice progress. Wound is measuring smaller 12/16; he comes in today with one of  his two wounds on the left opened infected second area on the left may be threatened as well as the healed one on the right. He has edema in both legs which I think is pitting. He says his weights are stable he has unknown fairly severe cardiomyopathy. His cardiologist is Dr. Brigitte Pulse it does not sound like he is really been carefully followed 12/23; the areas that I was concerned about on the left look a lot better we put some silver alginate and put him back in compression. It may be the compression that actually does the trick here. The remaining open area we have been using Iodoflex and again this looks a lot better. At our suggestion he went to see his primary who did not adjust his diuretic he is still apparently taking 20 mg of a loop diuretic and "sometimes" 40 mg is basically what the patient stated 1/6; the area on the left is closed once again. We will transition him into his 20/30 stocking from elastic therapy both areas on the right are now open which is a deterioration from last time at which time the only 1 was open but they are very superficial. We have been using Hydrofera Blue under compression 1/27; its been 3 weeks since the patient was here. We transitioned him to a 20/30 stocking on the left leg. Still under 4-layer compression on the right. He comes in today with a new small opening on the left leg but reopening on the right. Massive blistering on the right leg. He has severe systemic fluid volume overload. I have looked over the patient's records. He was seen in 2020 by tele health by cardiology. 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 I think he was supposed to have follow-up but that follow-up never seems to have happened. He does not complain of chest pain or shortness of breath but notes increasing edema not just in his lower legs 2/3; he has new open areas on the bilateral anterior lower legs. I think a lot of this is  because of systemic fluid volume overload. The ultrasound I ordered showed ascites and suggestion of cirrhosis. His physical exam  continues to suggest right greater than left congestive heart failure. I have him taking 40 mg of Lasix oral daily I have asked him to start weighing himself daily. With the intense efforts of our nursing staff we have him a cardiology appointment on Monday or Tuesday of next week I have told him not to miss this. With regards to his wounds I do not think we are going to be able to stop wrapping him until somebody manages to get his fluid volume down 2/10; the areas on his bilateral lateral lower legs actually look better today. He has been taking Lasix 40 mg a day I believe [2 x 20 mg]. He seems to have less edema in his legs and and is a result of this I think he has less edema in his lower legs as well and the wounds appear to be doing better. He comes in today with purulence coming out of the toenail bed of the left great toe it appears that his toenail came off there was noted to be pus I did a culture of this area 2/24; since the patient was last here 2 weeks ago he was admitted to hospital from 2/12 through 2/16 predominantly acute on chronic systolic heart failure. He also has stage IV chronic kidney disease discharge creatinine at 2.71 and on top of this morphology of the liver on ultrasound suggested cirrhosis of unclear etiology. He had 9.4 L of ascites tapped I am unclear whether this was sent for analysis at the time of this dictation. He is currently taking 40 mg of Demadex a day I am a bit surprised that was so low. Nevertheless the wounds on his legs are a lot better everything is closed on the right he still has an open area on the left and then the area on the great toe nail matrix on the left from last time. 10/30/2020 upon evaluation today patient appears to be doing well with regard to his legs other than the fact that they are extremely swollen. He has  multiple blisters noted bilateral lower extremities. Apparently the home health nurse came out yesterday unwrapped them and noticed a couple blisters she called 911 and would not rewrap him. There is no evidence of infection whatsoever we did not receive a phone call that we are aware of anyway regarding the fact that the home health nurse had any concerns at all. Nonetheless the major issue here is that the patient is going to really need to make sure that his fluids under good control but I do not see anything that indicates he needs to go to the hospital right now. There is literally no evidence of infection although fluid is just completely clear. 3/16; patient has home health. Apparently the last time he was here home health did not put compression wraps back on his legs after discovering blisters. By the time he got into see [place] he had had several additional blisters and wounds open up. He was put back in 4 layer compression most of this appears to be healing. He still has small superficial areas on his bilateral lower extremities He also has an area in the left great toe nailbed after lost his toe nail. 3/31; 2-week follow-up. Everything on the right leg is healed he has only has a very small open area remaining on the left leg in one of his original wound sites this should be healed by the next time he is here we have good edema control bilaterally. He  removed his toenail last time he was here. Drainage from this we are using topical antibiotics Finally he has increasing ascites which I think is probably cirrhosis rather than heart failure. He said he was going to go to the emergency room. I suggested that he go to his primary doctor get the primary doctor updated in where this is. If they are going to do paracentesis then that may be able to be done in interventional radiology rather than sitting for hours in the emergency room. He does not look to be in any distress. 4/14; patient returns to  clinic. The stockings he had did not maintain skin integrity on the right he has 3 open wounds here which started on blisters. Predictably everything is closed on the left. He tells me that since he was last here he had to go to the emergency room to deal with the ascites they tapped 8 L of fluid. Again if anybody is aware of the exact etiology of this with certainty the patient is not aware of this. All I know is the ultrasound I did suggested cirrhosis. 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 5/5; patient returns to clinic. Apparently was healed up by home health he did not have his wrap and did not have his stocking and blisters on his leg almost immediately occurred. He has a large open wound on the left anterior leg small area on the left great toe and 2 areas on the right lateral leg which are better than last time Electronic Signature(s) Signed: 01/01/2021 5:06:47 PM By: Linton Ham MD Entered By: Linton Ham on 01/01/2021 51:88:41 -------------------------------------------------------------------------------- Physical Exam Details Patient Name: Date of Service: [place] 01/01/2021 8:00 [place] Record Number: [id number] Patient Account Number: [id number] Date of Birth/Sex: Treating RN: 1940-12-27 (80 y.o. [name], [name] Primary Care Provider: Frederik Patton., [name] Other Clinician: Referring Provider: Treating Provider/Extender: [name]., [name] in Treatment: 29 Constitutional Sitting or standing Blood Pressure is within target range for patient.. Pulse regular and within target range for patient.Marland Kitchen Respirations regular, non-labored and within target range.. Temperature is normal and within the target range for the patient.Marland Kitchen Appears in no distress. Cardiovascular TR and MR murmurs JVP is elevated 3+ sacral edema. Gastrointestinal (GI) Abdominal distention compatible with  ascites. Notes Wound exam; area on the left leg large blisters anteriorly he has a large open wound medially which was noted a blister to begin with. On the right side he has 2 open wounds although these are better than last time and may be closed his edema control is quite good there is he is still has this wrapped Electronic Signature(s) Signed: 01/01/2021 5:06:47 PM By: Linton Ham MD Entered By: Linton Ham on 01/01/2021 08:33:42 -------------------------------------------------------------------------------- Physician Orders Details Patient Name: Date of Service: [place] 01/01/2021 8:00 [place] Record Number: [id number] Patient Account Number: [id number] Date of Birth/Sex: Treating RN: [birth year] (80 y.o. 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 - Thursday Dr. Dellia Nims Return A Bathing/ Shower/ Hygiene May shower with protection but do not get wound dressing(s) wet. May shower and wash wound with soap and water. - with dressing changes only. Edema Control - Lymphedema / SCD / Other Elevate legs to the level of the heart or above for 30 minutes daily and/or when sitting, a frequency of: - 3-4 times a day throughout the day. Avoid standing for long periods of time. Exercise  regularly Moisturize legs daily. - right leg every night. Compression stocking or Garment 20-30 mm/Hg pressure to: - ****if wounds close patient and home health to apply compression stocking to the leg.*** Home Health No change in wound care orders this week; continue Home Health for wound care. May utilize formulary equivalent dressing for wound treatment orders unless otherwise specified. - [facility] home health twice a week. ****if wounds close on a leg patient and home health to please apply compression stocking to the leg. patient cannot go without compression to legs.*** Please cover any blisters with calcium alginate Ag. Wound Treatment Wound #15 - T Great oe Wound Laterality: Left Cleanser: Soap and Water (Home Health) 2 x Per Day/15 Days Discharge Instructions: May shower and wash wound with dial antibacterial soap and water prior to dressing change. Topical: Mupirocin Ointment 2 x Per Day/15 Days Discharge Instructions: ***In Clinic only***. Apply Mupirocin (Bactroban) under Alignate Ag. Topical: Polysporin (Home Health) 2 x Per Day/15 Days Discharge Instructions: patient to purchase and home health to apply under the alginate Ag, Prim Dressing: KerraCel Ag Gelling Fiber Dressing, 2x2 in (silver alginate) ([place]) 2 x Per Day/15 Days ary Discharge Instructions: Apply silver alginate to wound bed as instructed Secondary Dressing: Woven Gauze Sponges 2x2 in ([place]) 2 x Per Day/15 Days Discharge Instructions: Apply over primary dressing as directed. Secured With: Child psychotherapist, Sterile 2x75 (in/in) (Home Health) 2 x Per Day/15 Days Discharge Instructions: Secure with stretch gauze as directed. Wound #16R - Lower Leg Wound Laterality: Right, Lateral Cleanser: Soap and Water (Home Health) 2 x Per Week/15 Days Discharge Instructions: May shower and wash wound with dial antibacterial soap and water prior to dressing change. Cleanser: Wound Cleanser (Home  Health) 2 x Per Week/15 Days Discharge Instructions: Cleanse the wound with wound cleanser prior to applying a clean dressing using gauze sponges, not tissue or cotton balls. Peri-Wound Care: Sween Lotion (Moisturizing lotion) (Home Health) 2 x Per Week/15 Days Discharge Instructions: Apply moisturizing lotion as directed Prim Dressing: KerraCel Ag Gelling Fiber Dressing, 4x5 in (silver alginate) (Home Health) 2 x Per Week/15 Days ary Discharge Instructions: Apply silver alginate to wound bed as instructed Secondary Dressing: Woven Gauze Sponge, Non-Sterile 4x4 in (Home Health) 2 x Per Week/15 Days Discharge Instructions: Apply over primary dressing as directed. Secondary Dressing: ABD Pad, 5x9 (Home Health) 2 x Per Week/15 Days Discharge Instructions: Apply over primary dressing as directed. Compression Wrap: FourPress (4 layer compression wrap) (Home Health) 2 x Per Week/15 Days Discharge Instructions: Apply four layer compression as directed. Wound #17 - Lower Leg Wound Laterality: Right, Lateral, Proximal Cleanser: Soap and Water (Home Health) 2 x Per Week/15 Days Discharge Instructions: May shower and wash wound with dial antibacterial soap and water prior to dressing change. Cleanser: Wound Cleanser (Home Health) 2 x Per Week/15 Days Discharge Instructions: Cleanse the  wound with wound cleanser prior to applying a clean dressing using gauze sponges, not tissue or cotton balls. Peri-Wound Care: Sween Lotion (Moisturizing lotion) (Home Health) 2 x Per Week/15 Days Discharge Instructions: Apply moisturizing lotion as directed Prim Dressing: KerraCel Ag Gelling Fiber Dressing, 4x5 in (silver alginate) (Home Health) 2 x Per Week/15 Days ary Discharge Instructions: Apply silver alginate to wound bed as instructed Secondary Dressing: Woven Gauze Sponge, Non-Sterile 4x4 in (Home Health) 2 x Per Week/15 Days Discharge Instructions: Apply over primary dressing as directed. Secondary Dressing:  ABD Pad, 5x9 (Home Health) 2 x Per Week/15 Days Discharge Instructions: Apply over primary dressing as directed. Compression Wrap: FourPress (4 layer compression wrap) (Home Health) 2 x Per Week/15 Days Discharge Instructions: Apply four layer compression as directed. Wound #19 - Lower Leg Wound Laterality: Left, Posterior Cleanser: Soap and Water (Home Health) 2 x Per Week/15 Days Discharge Instructions: May shower and wash wound with dial antibacterial soap and water prior to dressing change. Cleanser: Wound Cleanser (Home Health) 2 x Per Week/15 Days Discharge Instructions: Cleanse the wound with wound cleanser prior to applying a clean dressing using gauze sponges, not tissue or cotton balls. Peri-Wound Care: Zinc Oxide Ointment 30g tube (Home Health) 2 x Per Week/15 Days Discharge Instructions: Apply Zinc Oxide to periwound with each dressing change Peri-Wound Care: Sween Lotion (Moisturizing lotion) (Home Health) 2 x Per Week/15 Days Discharge Instructions: Apply moisturizing lotion as directed Prim Dressing: KerraCel Ag Gelling Fiber Dressing, 4x5 in (silver alginate) (Home Health) 2 x Per Week/15 Days ary Discharge Instructions: Apply silver alginate to wound bed as instructed Secondary Dressing: Woven Gauze Sponge, Non-Sterile 4x4 in (Home Health) 2 x Per Week/15 Days Discharge Instructions: Apply over primary dressing as directed. Secondary Dressing: ABD Pad, 5x9 (Home Health) 2 x Per Week/15 Days Discharge Instructions: Apply over primary dressing as directed. Compression Wrap: FourPress (4 layer compression wrap) (Home Health) 2 x Per Week/15 Days Discharge Instructions: Apply four layer compression as directed. Electronic Signature(s) Signed: 01/01/2021 5:06:47 PM By: Linton Ham MD Signed: 01/01/2021 6:21:36 PM By: Deon Pilling Entered By: Deon Pilling on 01/01/2021 08:29:09 -------------------------------------------------------------------------------- Problem List  Details Patient Name: Date of Service: [place], [place] 01/01/2021 8:00 [place] Record Number: [id number] Patient Account Number: [id number] Date of Birth/Sex: Treating RN: 1941/07/31 (80 y.o. 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 10/11/2019 No Yes extremity I89.0 Lymphedema, not elsewhere classified 10/11/2019 No Yes L97.822 Non-pressure chronic ulcer of other part of left lower leg with fat layer exposed2/06/2020 No Yes L97.812 Non-pressure chronic ulcer of other part of right lower leg with fat layer 01/03/2020 No Yes exposed W09.81 Chronic systolic (congestive) heart failure 10/02/2020 No Yes S90.812D Abrasion, left foot, subsequent encounter 10/09/2020 No Yes Inactive Problems ICD-10 Code Description Active Date Inactive Date L97.521 Non-pressure chronic ulcer of other part of left foot limited to breakdown of skin 10/11/2019 10/11/2019 S90.812D Abrasion, left foot, subsequent encounter 02/07/2020 02/07/2020 I42.9 Cardiomyopathy, unspecified 05/15/2020 05/15/2020 K92.1 Melena 05/15/2020 05/15/2020 Resolved Problems ICD-10 Code Description Active Date Resolved Date 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 10/11/2019 10/11/2019 Electronic Signature(s) Signed: 01/01/2021 5:06:47 PM By: Linton Ham MD Entered By: Linton Ham on 01/01/2021 08:30:39 -------------------------------------------------------------------------------- Progress Note Details  Patient Name: Date of Service: [place], [place] 01/01/2021 8:00 [place] Record Number: [id number] Patient Account Number: [id number] Date of Birth/Sex: Treating RN: 18-May-1941 (80 y.o. [name], [name] Primary Care Provider: Frederik Patton., [name] Other Clinician: Referring Provider: Treating Provider/Extender: [name]., [name] in Treatment: 56 Subjective History of Present Illness (HPI) ADMISSION 10/11/2019 This is a 80 year old man with a history of a severe cardiomyopathy with an ejection fraction of about 20%, chronic renal failure stage III. He is listed as a type II diabetic in epic although the patient denies this. He also has a history of PVD. He states for the last month he has had wounds on his bilateral lower extremities that started off as blisters which denuded. He has areas on the left lateral calf and 2 on the right lateral. He has an area on the left first met head which he did not know was there he we identified this on intake. He has been using Silvadene cream provided by his primary care physician but he is complaining that this burns. 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 He was acutely ill at that time complicating GI bleed. ABIs in our clinic were 1.16 on the right and 1.13 on the left 2/25; the patient comes in with his areas on the left lateral and right lateral calf. There is also an area over the left first MTP bunion deformity. We have been using Sorbact. His edema control is fairly good 3/4; left lateral and right lateral calf. Most of his wounds are in the same position tightly adherent nonviable debris. On the right we debrided the superior wound on the left both wounds. The area on his bunion over the left first MTP medially is I think just about closed. We have been  using Sorbact without a lot of success changed to Iodoflex under compression 3/11; left lateral and right lateral calf perhaps minor improvement in the surface condition. We have been using Iodoflex. The area over the bunion of the left first MTP has closed over 3/18; left lateral and right lateral calf not much improvement. We have been using Iodoflex. Aggressive debridement last week. 3/25; left lateral and right lateral calf. We have been using Iodoflex. There is some improvement in the superior area on the right and 2 on the lateral left although there is still a lot of debris on the surface. The inferior area on the right is still a completely nonviable surface. 4/8; left lateral and right lateral calfs. Essentially mirror-image looking wounds 2 wounds on each side in close juxtaposition we have been using Iodoflex with some improvement in the very adherent fibrinous debris but not a lot. The patient has arterial studies next Wednesday morning and venous studies next Thursday morning. I have been avoiding any further aggressive debridement until we see the arterial study results. His ABIs were fairly good in this clinic and is dorsalis pedis pulses are palpable but the wound beds are pale. 4/15; left lateral and right lateral calfs. Essentially mirror-image wounds with 2 wounds on each side in close juxtaposition but with rims of separating normal tissue. We  have been using Iodoflex. The base of the wounds has been cleaning up quite nicely ARTERIAL STUDIES were done showing the patient had an ABI on the right of 1.27 with triphasic waveforms and a TBI of 0.90 on the left triphasic waveforms with an ABI of 1.28 and a TBI of 0.87. No evidence of arterial disease VENOUS REFLUX STUDIES; were done yesterday we do not have this report yet. 4/23; VENOUS REFLUX STUDIES did not show any significant reflux right lower extremity. No evidence of a DVT He did have significant reflux in the  common . femoral vein on the left but nothing else was listed as significant. He did not have a DVT. We have been using Iodoflex under compression his wounds are making progress. 4/29; improvements in the wound surface continue. We have been using Iodoflex. He has a 20% out-of-pocket co-pay for Apligraf which is unfortunate. We changed him to Sorbact today 5/6; started on Sorbact last week. Better looking wound surface but not much change in dimensions. 01/24/20-Patient is back at 3 weeks, wound surfaces are about the same but very minimal slough on the right leg wounds, however there is blistering on both legs adjacent to the wounds, patient denies any other symptoms or new symptoms. His studies have been reviewed and do not indicate any significant arterial or venous disease. But he is attending once in 3 weeks with home health changing in between 6/3; patient has 4 wounds 2 on each side of his lateral lower legs. These wounds are somewhat improved. He apparently arrived in clinic last week with a large blister on the right lateral lower leg that had denuded into the wound. They changed to silver alginate. Still under compression. He has straight Medicare and unfortunately has an unlimited co-pay for advanced treatment options. 6/10; his original 4 wounds are all better especially on the left lateral lower leg. 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 6/24; blister on the right lateral lower leg has healed. There is no additional wounds. The traumatic left second toe is also closed. The 4 that he has are all better 7/1; all of the patient's wounds are  smaller except for the proximal one on the left lateral calf. We are using silver collagen 7/22; patient has home health. Relatively refractory wounds on the lateral part of both mid calfs. Each of them 2 wounds. The areas on the left are doing much better in fact the distal 1 looks like it is on his way to closure. The right required debridement with adherent fibrinous debris under illumination. We have been using silver collagen. Previous arterial studies were within normal limits he also had venous reflux studies 04/03/2020 upon evaluation today patient appears to be doing excellent with regard to his lower extremity ulcers. He is going to require a little bit of debridement on the wounds on the right especially in order to clear away some biofilm and slough but this is minimal and overall he seems to be doing quite well. 8/19; the patient's wounds on the left lateral lower leg just above closed. However the 2 on the lateral part of the right have  a nonviable necrotic surface. This is disappointing. There is also no change in dimensions. He has had arterial studies as well as venous reflux studies 8/26; very disappointing today. Even the more superficial areas on the left are about the same as last week. Still 2 punched-out areas completely unchanged on the right. We have been using silver collagen really making no progress. 9/2; changed to Iodoflex last week better looking wound surfaces especially on the right although they are deep and punched out. The areas on the left are more superficial open 1 of these is almost fully epithelialized although it has been this way for at least 2 weeks. He has a 20% co-pay [Medicare only] unaffordable for a skin substitute. Had some thought about a snap VAC on the deep areas on the right leg we will try to put this through in the insurance 9/9; punched-out wounds on the bilateral lateral lower extremities. We have been operating as if the these were chronic venous  wounds with secondary lymphedema. The areas on the left have been doing well in fact one of them is closed over. We have had no improvement in the areas on the right we have been using Iodoflex to help with surface debridement. The areas on the right are deeper wider. I do not see any evidence of infection. Home health is not been putting the wraps on high enough he has localized lymphedema right above the wounds. He has had arterial and venous studies already 9/16; punched-out mirror-image wounds on his bilateral lateral mid calfs. We have 1 closed on the left lateral the other smaller. For the first time today the areas on the right lateral actually looks some better. I did a biopsy of 1 of these last time although we still do not have this result. HOWEVER he arrives in clinic today complaining of chest pain overnight which felt like a brick on his chest. He also had 2 black watery bowel movements. He does not have nausea or vomiting. He is not describing abdominal pain. He has no prior history of diarrhea heartburn etc. 9/30; sent this patient to the ER on 9/16. 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 He is on twice daily PPIs Protonix. His admission hemoglobin was exceptionally low at 5. 8.. Discharged at 8.1. 4 unit transfusion. 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 His diuretic dose was actually decreased because of elevated creatinine currently at torsemide 20 mg not felt to be a Coumadin candidate. Patient states he feels well. The areas on his left leg are healed. The areas on the right look better. These have been very refractory wounds. He does not have stockings we will make arrangements for this today 10/7; the areas on the left leg are healed we will transition him into his 20/30 stocking today. Continued improvement in the areas on the right lateral leg 10/21; the areas on the  left leg remain healed at this 2-week follow-up however there is a history that he developed some drainage from one of the wound sites with home health therefore going ahead and wrapping him. He was supposed to be on 20/30 stockings although the history here is vague and I am not sure that this represents a stocking failure or not. We have been wrapping his right leg with the one remaining wound is the distal wound. We have been using silver collagen 10/28 the patient  only has 1 wound remaining on the right lower calf. This still has 3 mm of depth which is unchanged but the surface area is smaller. The superior wound on the right lateral is closed. The 2 wounds on the left remain closed and he is using his compression stocking 11/4 1 remaining wound on the right lower calf. Under illumination not a viable surface we have been using silver collagen we had good effect on the other areas but this 1 appears to be stalled. There is no open area on the left leg he is using his own stocking. 11/11; 1 remaining wound on the right lower calf. Surface of this is a lot better than last week but still requiring debridement I am using Iodoflex but looking forward to changing the primary dressing to perhaps endoform 11/18; 1 remaining wound on the right lower leg, venous insufficiency, vigorous debridement last week/Iodoflex. Depth of the wound is therefore larger but the wound looks clean. Still very gritty material at the surface requiring debridement 12/2; the remaining wound on his right lower leg, venous insufficiency. I changed him to endoform 2 weeks ago. We appear to be making nice progress. Wound is measuring smaller 12/16; he comes in today with one of his two wounds on the left opened infected second area on the left may be threatened as well as the healed one on the right. He has edema in both legs which I think is pitting. He says his weights are stable he has unknown fairly severe cardiomyopathy. His  cardiologist is Dr. Brigitte Pulse it does not sound like he is really been carefully followed 12/23; the areas that I was concerned about on the left look a lot better we put some silver alginate and put him back in compression. It may be the compression that actually does the trick here. The remaining open area we have been using Iodoflex and again this looks a lot better. At our suggestion he went to see his primary who did not adjust his diuretic he is still apparently taking 20 mg of a loop diuretic and "sometimes" 40 mg is basically what the patient stated 1/6; the area on the left is closed once again. We will transition him into his 20/30 stocking from elastic therapy both areas on the right are now open which is a deterioration from last time at which time the only 1 was open but they are very superficial. We have been using Hydrofera Blue under compression 1/27; its been 3 weeks since the patient was here. We transitioned him to a 20/30 stocking on the left leg. Still under 4-layer compression on the right. He comes in today with a new small opening on the left leg but reopening on the right. Massive blistering on the right leg. He has severe systemic fluid volume overload. I have looked over the patient's records. He was seen in 2020 by tele health by cardiology. 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 I think he was supposed to have follow-up but that follow-up never seems to have happened. He does not complain of chest pain or shortness of breath but notes increasing edema not just in his lower legs 2/3; he has new open areas on the bilateral anterior lower legs. I think a lot of this is because of systemic fluid volume overload. The ultrasound I ordered showed ascites and suggestion of cirrhosis. His physical exam continues to suggest right greater than left congestive heart  failure. I have him taking 40 mg of Lasix oral daily I have asked  him to start weighing himself daily. With the intense efforts of our nursing staff we have him a cardiology appointment on Monday or Tuesday of next week I have told him not to miss this. With regards to his wounds I do not think we are going to be able to stop wrapping him until somebody manages to get his fluid volume down 2/10; the areas on his bilateral lateral lower legs actually look better today. He has been taking Lasix 40 mg a day I believe [2 x 20 mg]. He seems to have less edema in his legs and and is a result of this I think he has less edema in his lower legs as well and the wounds appear to be doing better. He comes in today with purulence coming out of the toenail bed of the left great toe it appears that his toenail came off there was noted to be pus I did a culture of this area 2/24; since the patient was last here 2 weeks ago he was admitted to hospital from 2/12 through 2/16 predominantly acute on chronic systolic heart failure. He also has stage IV chronic kidney disease discharge creatinine at 2.71 and on top of this morphology of the liver on ultrasound suggested cirrhosis of unclear etiology. He had 9.4 L of ascites tapped I am unclear whether this was sent for analysis at the time of this dictation. He is currently taking 40 mg of Demadex a day I am a bit surprised that was so low. Nevertheless the wounds on his legs are a lot better everything is closed on the right he still has an open area on the left and then the area on the great toe nail matrix on the left from last time. 10/30/2020 upon evaluation today patient appears to be doing well with regard to his legs other than the fact that they are extremely swollen. He has multiple blisters noted bilateral lower extremities. Apparently the home health nurse came out yesterday unwrapped them and noticed a couple blisters she called 911 and would not rewrap him. There is no evidence of infection whatsoever we did not receive a  phone call that we are aware of anyway regarding the fact that the home health nurse had any concerns at all. Nonetheless the major issue here is that the patient is going to really need to make sure that his fluids under good control but I do not see anything that indicates he needs to go to the hospital right now. There is literally no evidence of infection although fluid is just completely clear. 3/16; patient has home health. Apparently the last time he was here home health did not put compression wraps back on his legs after discovering blisters. By the time he got into see [place] he had had several additional blisters and wounds open up. He was put back in 4 layer compression most of this appears to be healing. He still has small superficial areas on his bilateral lower extremities He also has an area in the left great toe nailbed after lost his toe nail. 3/31; 2-week follow-up. Everything on the right leg is healed he has only has a very small open area remaining on the left leg in one of his original wound sites this should be healed by the next time he is here we have good edema control bilaterally. He removed his toenail last time he was here.  Drainage from this we are using topical antibiotics Finally he has increasing ascites which I think is probably cirrhosis rather than heart failure. He said he was going to go to the emergency room. I suggested that he go to his primary doctor get the primary doctor updated in where this is. If they are going to do paracentesis then that may be able to be done in interventional radiology rather than sitting for hours in the emergency room. He does not look to be in any distress. 4/14; patient returns to clinic. The stockings he had did not maintain skin integrity on the right he has 3 open wounds here which started on blisters. Predictably everything is closed on the left. He tells me that since he was last here he had to go to the emergency room to deal  with the ascites they tapped 8 L of fluid. Again if anybody is aware of the exact etiology of this with certainty the patient is not aware of this. All I know is the ultrasound I did suggested cirrhosis. 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 5/5; patient returns to clinic. Apparently was healed up by home health he did not have his wrap and did not have his stocking and blisters on his leg almost immediately occurred. He has a large open wound on the left anterior leg small area on the left great toe and 2 areas on the right lateral leg which are better than last time Objective Constitutional Sitting or standing Blood Pressure is within target range for patient.. Pulse regular and within target range for patient.Marland Kitchen Respirations regular, non-labored and within target range.. Temperature is normal and within the target range for the patient.Marland Kitchen Appears in no distress. Vitals Time Taken: 7:49 AM, Height: 71 in, Weight: 198 lbs, BMI: 27.6, Temperature: 97.6 F, Pulse: 102 bpm, Respiratory Rate: 20 breaths/min, Blood Pressure: 119/73 mmHg. Cardiovascular TR and MR murmurs JVP is elevated 3+ sacral edema. Gastrointestinal (GI) Abdominal distention compatible with ascites. General Notes: Wound exam; area on the left leg large blisters anteriorly he has a large open wound medially which was noted a blister to begin with. oo On the right side he has 2 open wounds although these are better than last time and may be closed his edema control is quite good there is he is still has this wrapped Integumentary (Hair, Skin) Wound #15 status is Open. Original cause of wound was Gradually Appeared. The date acquired was: 10/09/2020. The wound has been in treatment 12 weeks. The wound is located on the Left T Great. The wound measures 0.6cm length x 1.2cm width x 0.1cm depth; 0.565cm^2 area and 0.057cm^3 volume. There is oe Fat Layer (Subcutaneous  Tissue) exposed. There is no tunneling or undermining noted. There is a small amount of serosanguineous drainage noted. The wound margin is flat and intact. There is large (67-100%) granulation within the wound bed. There is no necrotic tissue within the wound bed. Wound #16R status is Open. Original cause of wound was Gradually Appeared. The date acquired was: 11/13/2020. The wound has been in treatment 7 weeks. The wound is located on the Right,Lateral Lower Leg. The wound measures 1.1cm length x 0.8cm width x 0.1cm depth; 0.691cm^2 area and 0.069cm^3 volume. There is Fat Layer (Subcutaneous Tissue) exposed. There is no tunneling or undermining noted. There is a medium amount of serous drainage noted. The wound margin is flat and intact. There is  large (67-100%) pink granulation within the wound bed. There is a small (1-33%) amount of necrotic tissue within the wound bed including Adherent Slough. Wound #17 status is Open. Original cause of wound was Blister. The date acquired was: 12/04/2020. The wound has been in treatment 3 weeks. The wound is located on the Right,Proximal,Lateral Lower Leg. The wound measures 1cm length x 0.3cm width x 0.1cm depth; 0.236cm^2 area and 0.024cm^3 volume. There is Fat Layer (Subcutaneous Tissue) exposed. There is no tunneling or undermining noted. There is a medium amount of serous drainage noted. The wound margin is flat and intact. There is large (67-100%) pink granulation within the wound bed. There is a small (1-33%) amount of necrotic tissue within the wound bed including Adherent Slough. Wound #18 status is Open. Original cause of wound was Blister. The date acquired was: 12/04/2020. The wound has been in treatment 3 weeks. The wound is located on the Right,Distal,Lateral Lower Leg. The wound measures 0cm length x 0cm width x 0cm depth; 0cm^2 area and 0cm^3 volume. Wound #19 status is Open. Original cause of wound was Blister. The date acquired was: 12/04/2020. The  wound has been in treatment 3 weeks. The wound is located on the Left,Posterior Lower Leg. The wound measures 9cm length x 8.1cm width x 0.1cm depth; 57.256cm^2 area and 5.726cm^3 volume. There is Fat Layer (Subcutaneous Tissue) exposed. There is no tunneling or undermining noted. There is a large amount of serous drainage noted. The wound margin is flat and intact. There is large (67-100%) red granulation within the wound bed. There is no necrotic tissue within the wound bed. General Notes: x3 blisters noted to left lower leg. Assessment Active Problems ICD-10 Chronic venous hypertension (idiopathic) with inflammation of bilateral lower extremity Lymphedema, not elsewhere classified Non-pressure chronic ulcer of other part of left lower leg with fat layer exposed Non-pressure chronic ulcer of other part of right lower leg with fat layer exposed Chronic systolic (congestive) heart failure Abrasion, left foot, subsequent encounter Procedures Wound #16R Pre-procedure diagnosis of Wound #16R is a Venous Leg Ulcer located on the Right,Lateral Lower Leg . There was a Four Layer Compression Therapy Procedure by Baruch Gouty, RN. Post procedure Diagnosis Wound #16R: Same as Pre-Procedure Wound #17 Pre-procedure diagnosis of Wound #17 is a Venous Leg Ulcer located on the Right,Proximal,Lateral Lower Leg . There was a Four Layer Compression Therapy Procedure by Baruch Gouty, RN. Post procedure Diagnosis Wound #17: Same as Pre-Procedure Wound #19 Pre-procedure diagnosis of Wound #19 is a Venous Leg Ulcer located on the Left,Posterior Lower Leg . There was a Four Layer Compression Therapy Procedure by Baruch Gouty, RN. Post procedure Diagnosis Wound #19: Same as Pre-Procedure Plan Follow-up Appointments: Return Appointment in 1 week. - Thursday Dr. Dellia Nims Bathing/ Shower/ Hygiene: May shower with protection but do not get wound dressing(s) wet. May shower and wash wound with soap and  water. - with dressing changes only. Edema Control - Lymphedema / SCD / Other: Elevate legs to the level of the heart or above for 30 minutes daily and/or when sitting, a frequency of: - 3-4 times a day throughout the day. Avoid standing for long periods of time. Exercise regularly Moisturize legs daily. - right leg every night. Compression stocking or Garment 20-30 mm/Hg pressure to: - ****if wounds close patient and home health to apply compression stocking to the leg.*** Home Health: No change in wound care orders this week; continue Home Health for wound care. May utilize formulary equivalent dressing for wound  treatment orders unless otherwise specified. - [facility] home health twice a week. ****if wounds close on a leg patient and home health to please apply compression stocking to the leg. patient cannot go without compression to legs.*** Please cover any blisters with calcium alginate Ag. WOUND #15: - T Great Wound Laterality: Left oe Cleanser: Soap and Water (Home Health) 2 x Per KTG/25 Days Discharge Instructions: May shower and wash wound with dial antibacterial soap and water prior to dressing change. Topical: Mupirocin Ointment 2 x Per Day/15 Days Discharge Instructions: ***In Clinic only***. Apply Mupirocin (Bactroban) under Alignate Ag. Topical: Polysporin (Home Health) 2 x Per Day/15 Days Discharge Instructions: patient to purchase and home health to apply under the alginate Ag, Prim Dressing: KerraCel Ag Gelling Fiber Dressing, 2x2 in (silver alginate) ([place]) 2 x Per Day/15 Days ary Discharge Instructions: Apply silver alginate to wound bed as instructed Secondary Dressing: Woven Gauze Sponges 2x2 in ([place]) 2 x Per Day/15 Days Discharge Instructions: Apply over primary dressing as directed. Secured With: Child psychotherapist, Sterile 2x75 (in/in) (Home Health) 2 x Per Day/15 Days Discharge Instructions: Secure with stretch gauze as directed. WOUND  #16R: - Lower Leg Wound Laterality: Right, Lateral Cleanser: Soap and Water (Home Health) 2 x Per Week/15 Days Discharge Instructions: May shower and wash wound with dial antibacterial soap and water prior to dressing change. Cleanser: Wound Cleanser (Home Health) 2 x Per Week/15 Days Discharge Instructions: Cleanse the wound with wound cleanser prior to applying a clean dressing using gauze sponges, not tissue or cotton balls. Peri-Wound Care: Sween Lotion (Moisturizing lotion) (Home Health) 2 x Per Week/15 Days Discharge Instructions: Apply moisturizing lotion as directed Prim Dressing: KerraCel Ag Gelling Fiber Dressing, 4x5 in (silver alginate) (Home Health) 2 x Per Week/15 Days ary Discharge Instructions: Apply silver alginate to wound bed as instructed Secondary Dressing: Woven Gauze Sponge, Non-Sterile 4x4 in (Home Health) 2 x Per Week/15 Days Discharge Instructions: Apply over primary dressing as directed. Secondary Dressing: ABD Pad, 5x9 (Home Health) 2 x Per Week/15 Days Discharge Instructions: Apply over primary dressing as directed. Com pression Wrap: FourPress (4 layer compression wrap) (Home Health) 2 x Per Week/15 Days Discharge Instructions: Apply four layer compression as directed. WOUND #17: - Lower Leg Wound Laterality: Right, Lateral, Proximal Cleanser: Soap and Water (Home Health) 2 x Per Week/15 Days Discharge Instructions: May shower and wash wound with dial antibacterial soap and water prior to dressing change. Cleanser: Wound Cleanser (Home Health) 2 x Per Week/15 Days Discharge Instructions: Cleanse the wound with wound cleanser prior to applying a clean dressing using gauze sponges, not tissue or cotton balls. Peri-Wound Care: Sween Lotion (Moisturizing lotion) (Home Health) 2 x Per Week/15 Days Discharge Instructions: Apply moisturizing lotion as directed Prim Dressing: KerraCel Ag Gelling Fiber Dressing, 4x5 in (silver alginate) (Home Health) 2 x Per Week/15  Days ary Discharge Instructions: Apply silver alginate to wound bed as instructed Secondary Dressing: Woven Gauze Sponge, Non-Sterile 4x4 in (Home Health) 2 x Per Week/15 Days Discharge Instructions: Apply over primary dressing as directed. Secondary Dressing: ABD Pad, 5x9 (Home Health) 2 x Per Week/15 Days Discharge Instructions: Apply over primary dressing as directed. Com pression Wrap: FourPress (4 layer compression wrap) (Home Health) 2 x Per Week/15 Days Discharge Instructions: Apply four layer compression as directed. WOUND #19: - Lower Leg Wound Laterality: Left, Posterior Cleanser: Soap and Water (Home Health) 2 x Per Week/15 Days Discharge Instructions: May shower and wash wound with dial antibacterial  soap and water prior to dressing change. Cleanser: Wound Cleanser (Home Health) 2 x Per Week/15 Days Discharge Instructions: Cleanse the wound with wound cleanser prior to applying a clean dressing using gauze sponges, not tissue or cotton balls. Peri-Wound Care: Zinc Oxide Ointment 30g tube (Home Health) 2 x Per Week/15 Days Discharge Instructions: Apply Zinc Oxide to periwound with each dressing change Peri-Wound Care: Sween Lotion (Moisturizing lotion) (Home Health) 2 x Per Week/15 Days Discharge Instructions: Apply moisturizing lotion as directed Prim Dressing: KerraCel Ag Gelling Fiber Dressing, 4x5 in (silver alginate) (Home Health) 2 x Per Week/15 Days ary Discharge Instructions: Apply silver alginate to wound bed as instructed Secondary Dressing: Woven Gauze Sponge, Non-Sterile 4x4 in (Home Health) 2 x Per Week/15 Days Discharge Instructions: Apply over primary dressing as directed. Secondary Dressing: ABD Pad, 5x9 (Home Health) 2 x Per Week/15 Days Discharge Instructions: Apply over primary dressing as directed. Com pression Wrap: FourPress (4 layer compression wrap) (Home Health) 2 x Per Week/15 Days Discharge Instructions: Apply four layer compression as directed. 1.  Silver alginate to all open areas on both legs and the left first toe 2. Both legs in 4-layer compression 3. He is going to require adjustment of his diuretics. Although I guess he has been referred for paracentesis and drainage he is systemically fluid volume overloaded. Electronic Signature(s) Signed: 01/01/2021 5:06:47 PM By: Linton Ham MD Entered By: Linton Ham on 01/01/2021 08:35:02 -------------------------------------------------------------------------------- SuperBill Details Patient Name: Date of Service: [place], [place] 01/01/2021 Medical Record Number: [id number] Patient Account Number: [id number] Date of Birth/Sex: Treating RN: March 03, 1941 (80 y.o. [name], [name] Primary Care Provider: Frederik Patton., [name] Other Clinician: Referring Provider: Treating Provider/Extender: [name]., [name] in Treatment: 64 Diagnosis Coding ICD-10 Codes Code Description [phone] Chronic venous hypertension (idiopathic) with inflammation of bilateral lower extremity I89.0 Lymphedema, not elsewhere classified L97.822 Non-pressure chronic ulcer of other part of left lower leg with fat layer exposed L97.812 Non-pressure chronic ulcer of other part of right lower leg with fat layer exposed A15.87 Chronic systolic (congestive) heart failure S90.812D Abrasion, left foot, subsequent encounter Facility Procedures The patient participates with Medicare or their insurance follows the Medicare Facility Guidelines: CPT4 Description Modifier Quantity Code [id number] BILATERAL: Application of multi-layer venous compression system; leg (below knee), including ankle and 1 foot. Physician Procedures : CPT4 Code Description Modifier [id number] - WC PHYS LEVEL 3 - EST PT ICD-10 Diagnosis Description I89.0 Lymphedema, not elsewhere classified Q19.012 Non-pressure chronic ulcer of other part of right lower leg with fat layer exposed I87.323 Chronic  venous  hypertension (idiopathic) with inflammation of bilateral lower extremity Quantity: 1 Electronic Signature(s) Signed: 01/01/2021 5:06:47 PM By: Linton Ham MD Entered By: Linton Ham on 01/01/2021 08:35:26

## 2021-01-02 NOTE — Progress Notes (Signed)
[name], [name] ([id number]) Visit Report for 01/01/2021 Arrival Information Details Patient Name: Date of Service: [name], [name] 01/01/2021 8:00 [place] Record Number: [id number] Patient Account Number: [id number] Date of Birth/Sex: Treating RN: 12/01/1940 (80 y.o. [name] Primary Care Sharolyn Weber: [name]., [name] Other Clinician: Referring Avrianna Smart: Treating Meshilem Machuca/Extender: [name], [name] in Treatment: 64 Visit Information History Since Last Visit Added or deleted any medications: No Patient Arrived: Walker Any new allergies or adverse reactions: No Arrival Time: 07:48 Had a fall or experienced change in No Accompanied By: self activities of daily living that may affect Transfer Assistance: None risk of falls: Patient Identification Verified: Yes Signs or symptoms of abuse/neglect since last visito No Secondary Verification Process Completed: Yes Hospitalized since last visit: No Patient Requires Transmission-Based Precautions: No Implantable device outside of the clinic excluding No Patient Has Alerts: Yes cellular tissue based products placed in the center Patient Alerts: Patient on Blood Thinner since last visit: Has Dressing in Place as Prescribed: Yes Pain Present Now: No Electronic Signature(s) Signed: 01/01/2021 9:47:30 AM By: Sandre Kitty Entered By: Sandre Kitty on 01/01/2021 07:49:02 -------------------------------------------------------------------------------- Compression Therapy Details Patient Name: Date of Service: Troy Addy [name] [place] 01/01/2021 8:00 A M Medical Record Number: [id number] Patient Account Number: [id number] Date of Birth/Sex: Treating RN: 1941-07-23 (80 y.o. [name] Primary Care Jaloni Davoli: [name]., [name] Other Clinician: Referring Daysie Helf: Treating Belina Mandile/Extender: [name], [name] in Treatment: 64 Compression Therapy Performed for Wound  Assessment: Wound #16R Right,Lateral Lower Leg Performed By: Clinician Baruch Gouty, RN Compression Type: Four Layer Post Procedure Diagnosis Same as Pre-procedure Electronic Signature(s) Signed: 01/01/2021 6:21:36 PM By: Deon Pilling Entered By: Deon Pilling on 01/01/2021 08:29:21 -------------------------------------------------------------------------------- Compression Therapy Details Patient Name: Date of Service: Troy [place], [place] 01/01/2021 8:00 A M Medical Record Number: [id number] Patient Account Number: [id number] Date of Birth/Sex: Treating RN: Jun 07, 1941 (80 y.o. [name] Primary Care Shayna Eblen: [name]., [name] Other Clinician: Referring Lesley Galentine: Treating Hardeep Reetz/Extender: [name], [name] in Treatment: 64 Compression Therapy Performed for Wound Assessment: Wound #19 Left,Posterior Lower Leg Performed By: Clinician Baruch Gouty, RN Compression Type: Four Layer Post Procedure Diagnosis Same as Pre-procedure Electronic Signature(s) Signed: 01/01/2021 6:21:36 PM By: Deon Pilling Entered By: Deon Pilling on 01/01/2021 08:29:21 -------------------------------------------------------------------------------- Compression Therapy Details Patient Name: Date of Service: Troy Ramona [name] [place] 01/01/2021 8:00 A M Medical Record Number: [id number] Patient Account Number: [id number] Date of Birth/Sex: Treating RN: August 21, 1941 (80 y.o. [name] Primary Care Miller Edging[place]: [name]., [name] Other Clinician: Referring Ramiro Pangilinan: Treating Brooke Payes/Extender: [name], [name] in Treatment: 64 Compression Therapy Performed for Wound Assessment: Wound #17 Right,Proximal,Lateral Lower Leg Performed By: Clinician Baruch Gouty, RN Compression Type: Four Layer Post Procedure Diagnosis Same as Pre-procedure Electronic Signature(s) Signed: 01/01/2021 6:21:36 PM By: Deon Pilling Entered By: Deon Pilling on  01/01/2021 08:29:21 -------------------------------------------------------------------------------- Encounter Discharge Information Details Patient Name: Date of Service: Troy [place], [place] 01/01/2021 8:00 [place] Record Number: [id number] Patient Account Number: [id number] Date of Birth/Sex: Treating RN: 1941/01/28 (80 y.o. [name] Primary Care Havanna Groner: [name]., [name] Other Clinician: Referring Olawale Marney: Treating Rula [place]/Extender: [name], [name] in Treatment: 69 Encounter Discharge Information Items Discharge Condition: Stable Ambulatory Status: Walker Discharge Destination: Home Transportation:  Other Accompanied By: self Schedule Follow-up Appointment: Yes Clinical Summary of Care: Patient Declined Notes transportation service Electronic Signature(s) Signed: 01/02/2021 5:11:03 PM By: Baruch Gouty RN, BSN Entered By: Baruch Gouty on 01/01/2021 08:59:44 -------------------------------------------------------------------------------- Lower Extremity Assessment Details Patient Name: Date of Service: Troy [place], [place] 01/01/2021 8:00 [place] Record Number: [id number] Patient Account Number: [id number] Date of Birth/Sex: Treating RN: 1940-09-29 (80 y.o. [name], [name] Primary Care Byanca Kasper: [name]., [name] Other Clinician: Referring Ilah Boule: Treating Analysia Dungee/Extender: [name], [name] in Treatment: 64 Edema Assessment Assessed: [Left: Yes] [Right: Yes] Edema: [Left: Yes] [Right: Yes] Calf Left: Right: Point of Measurement: 53 cm From Medial Instep 41 cm 35 cm Ankle Left: Right: Point of Measurement: 14 cm From Medial Instep 25 cm 24 cm Vascular Assessment Pulses: Dorsalis Pedis Palpable: [Left:Yes] [Right:Yes] Electronic Signature(s) Signed: 01/01/2021 6:21:36 PM By: Deon Pilling Entered By: Deon Pilling on 01/01/2021  08:16:15 -------------------------------------------------------------------------------- Multi Wound Chart Details Patient Name: Date of Service: Troy [place], [place] 01/01/2021 8:00 A M Medical Record Number: [id number] Patient Account Number: [id number] Date of Birth/Sex: Treating RN: September 02, 1940 (80 y.o. [name], [name] Primary Care Alaa Mullally: [name]., [name] Other Clinician: Referring Shahana Capes: Treating Kaylen Nghiem/Extender: [name], [name] in Treatment: 70 Vital Signs Height(in): 71 Pulse(bpm): 102 Weight(lbs): 198 Blood Pressure(mmHg): 119/73 Body Mass Index(BMI): 28 Temperature(F): 97.6 Respiratory Rate(breaths/min): 20 Photos: [15:No Photos Left T Great oe] [16R:No Photos Right, Lateral Lower Leg] [17:No Photos Right, Proximal, Lateral Lower Leg] Wound Location: [15:Gradually Appeared] [16R:Gradually Appeared] [17:Blister] Wounding Event: [15:Infection - not elsewhere classified Venous Leg Ulcer] [17:Venous Leg Ulcer] Primary Etiology: [15:N/A] [16R:N/A] [17:Lymphedema] Secondary Etiology: [15:Anemia, Congestive Heart Failure,] [16R:Anemia, Congestive Heart Failure,] [17:Anemia, Congestive Heart Failure,] Comorbid History: [15:Hypertension, Peripheral Venous Disease, End Stage Renal Disease Disease, End Stage Renal Disease Disease, End Stage Renal Disease 10/09/2020] [16R:Hypertension, Peripheral Venous 11/13/2020] [17:Hypertension, Peripheral Venous  12/04/2020] Date Acquired: [15:12] [16R:7] [17:3] Weeks of Treatment: [15:Open] [16R:Open] [17:Open] Wound Status: [15:No] [16R:Yes] [17:No] Wound Recurrence: [15:0.6x1.2x0.1] [16R:1.1x0.8x0.1] [17:1x0.3x0.1] Measurements L x W x D (cm) [15:0.565] [16R:0.691] [17:0.236] A (cm) : rea [15:0.057] [16R:0.069] [17:0.024] Volume (cm) : [15:64.00%] [16R:37.20%] [17:24.80%] % Reduction in Area: [15:63.70%] [16R:37.30%] [17:22.60%] % Reduction in Volume: [15:Full Thickness Without Exposed] [16R:Full  Thickness Without Exposed] [17:Full Thickness Without Exposed] Classification: [15:Support Structures Small] [16R:Support Structures Medium] [17:Support Structures Medium] Exudate Amount: [15:Serosanguineous] [16R:Serous] [17:Serous] Exudate Type: [15:red, brown] [16R:amber] [17:amber] Exudate Color: [15:Flat and Intact] [16R:Flat and Intact] [17:Flat and Intact] Wound Margin: [15:Large (67-100%)] [16R:Large (67-100%)] [17:Large (67-100%)] Granulation Amount: [15:N/A] [16R:Pink] [17:Pink] Granulation Quality: [15:None Present (0%)] [16R:Small (1-33%)] [17:Small (1-33%)] Necrotic Amount: [15:Fat Layer (Subcutaneous Tissue): Yes Fat Layer (Subcutaneous Tissue): Yes Fat Layer (Subcutaneous Tissue): Yes] Exposed Structures: [15:Fascia: No Tendon: No Muscle: No Joint: No Bone: No Medium (34-66%)] [16R:Fascia: No Tendon: No Muscle: No Joint: No Bone: No Small (1-33%)] [17:Fascia: No Tendon: No Muscle: No Joint: No Bone: No Small (1-33%)] Epithelialization: [15:N/A] [16R:N/A] [17:N/A] Assessment Notes: [15:N/A] [16R:Compression Therapy] [17:Compression Therapy] Wound Number: [id number] 19 N/A Photos: No Photos No Photos N/A Right, Distal, Lateral Lower Leg Left, Posterior Lower Leg N/A Wound Location: Blister Blister N/A Wounding Event: Venous Leg Ulcer Venous Leg Ulcer N/A Primary Etiology: Lymphedema Lymphedema N/A Secondary Etiology: N/A Anemia, Congestive Heart Failure, N/A Comorbid History: Hypertension, Peripheral Venous Disease, End Stage Renal Disease 12/04/2020 12/04/2020 N/A Date Acquired: 3 3 N/A Weeks of Treatment: Open Open N/A Wound  Status: No No N/A Wound Recurrence: 0x0x0 9x8.1x0.1 N/A Measurements L x W x D (cm) 0 57.256 N/A A (cm) : rea 0 5.726 N/A Volume (cm) : 100.00% -1066.30% N/A % Reduction in Area: 100.00% -1066.20% N/A % Reduction in Volume: Full Thickness Without Exposed Full Thickness Without Exposed N/A Classification: Support Structures Support  Structures N/A Large N/A Exudate Amount: N/A Serous N/A Exudate Type: N/A amber N/A Exudate Color: N/A Flat and Intact N/A Wound Margin: N/A Large (67-100%) N/A Granulation Amount: N/A Red N/A Granulation Quality: N/A None Present (0%) N/A Necrotic Amount: N/A Fat Layer (Subcutaneous Tissue): Yes N/A Exposed Structures: Fascia: No Tendon: No Muscle: No Joint: No Bone: No N/A Small (1-33%) N/A Epithelialization: N/A x3 blisters noted to left lower leg. N/A Assessment Notes: N/A Compression Therapy N/A Procedures Performed: Treatment Notes Electronic Signature(s) Signed: 01/01/2021 5:06:47 PM By: Troy Ham MD Signed: 01/01/2021 6:21:36 PM By: Deon Pilling Entered By: [name] on 01/01/2021 08:30:43 -------------------------------------------------------------------------------- Multi-Disciplinary Care Plan Details Patient Name: Date of Service: Troy [place] 01/01/2021 8:00 [place] Record Number: [id number] Patient Account Number: [id number] Date of Birth/Sex: Treating RN: 1940/11/30 (80 y.o. [name] Primary Care Chancy Claros: [name]., [name] Other Clinician: Referring Kaiven Vester: Treating Allyse Fregeau/Extender: [name], [name] in Treatment: 59 Multidisciplinary Care Plan reviewed with physician Active Inactive Venous Leg Ulcer Nursing Diagnoses: Knowledge deficit related to disease process and management Potential for venous Insuffiency (use before diagnosis confirmed) Goals: Patient will maintain optimal edema control Date Initiated: 05/01/2020 Target Resolution Date: 02/27/2021 Goal Status: Active Interventions: Assess peripheral edema status every visit. Compression as ordered Provide education on venous insufficiency Notes: Electronic Signature(s) Signed: 01/01/2021 6:21:36 PM By: Deon Pilling Entered By: Deon Pilling on 01/01/2021  07:54:35 -------------------------------------------------------------------------------- Pain Assessment Details Patient Name: Date of Service: Troy [place] [name] [place] 01/01/2021 8:00 A M Medical Record Number: [id number] Patient Account Number: [id number] Date of Birth/Sex: Treating RN: [birth year] (80 y.o. [name] Primary Care Chara Marquard: [name]., [name] Other Clinician: Referring Danon Lograsso: Treating Ariyanna Oien/Extender: [name], [name] in Treatment: 10 Active Problems Location of Pain Severity and Description of Pain Patient Has Paino No Site Locations Pain Management and Medication Current Pain Management: Electronic Signature(s) Signed: 01/01/2021 9:47:30 AM By: Sandre Kitty Signed: 01/01/2021 6:21:36 PM By: Deon Pilling Entered By: Sandre Kitty on 01/01/2021 07:49:25 -------------------------------------------------------------------------------- Patient/Caregiver Education Details Patient Name: Date of Service: Troy [place], [place] 5/5/2022andnbsp8:00 [place] Record Number: [id number] Patient Account Number: [id number] Date of Birth/Gender: Treating RN: 1941-02-26 (80 y.o. [name] Primary Care Physician: [name]., [name] Other Clinician: Referring Physician: Treating Physician/Extender: [name], [name] in Treatment: 68 Education Assessment Education Provided To: Patient Education Topics Provided Wound/Skin Impairment: Handouts: Skin Care Do's and Dont's Methods: Explain/Verbal Responses: Reinforcements needed Electronic Signature(s) Signed: 01/01/2021 6:21:36 PM By: Deon Pilling Entered By: Deon Pilling on 01/01/2021 07:54:52 -------------------------------------------------------------------------------- Wound Assessment Details Patient Name: Date of Service: Troy [place] [name] [place] 01/01/2021 8:00 [place] Record Number: [id number] Patient Account Number: [id number] Date of  Birth/Sex: Treating RN: 01/21/1941 (80 y.o. [name], [name] Primary Care Daniell Mancinas: [name]., [name] Other Clinician: Referring Kharon Hixon: Treating Marchelle Rinella/Extender: [name], [name] in Treatment: 64 Wound Status Wound Number: 15 Primary Infection - not elsewhere classified Etiology: Wound Location: Left T Great oe Wound  Open Wounding Event: Gradually Appeared Status: Date Acquired: 10/09/2020 Comorbid Anemia, Congestive Heart Failure, Hypertension, Peripheral Weeks Of Treatment: 12 History: Venous Disease, End Stage Renal Disease Clustered Wound: No Photos Wound Measurements Length: (cm) 0.6 Width: (cm) 1.2 Depth: (cm) 0.1 Area: (cm) 0.565 Volume: (cm) 0.057 % Reduction in Area: 64% % Reduction in Volume: 63.7% Epithelialization: Medium (34-66%) Tunneling: No Undermining: No Wound Description Classification: Full Thickness Without Exposed Support Structures Wound Margin: Flat and Intact Exudate Amount: Small Exudate Type: Serosanguineous Exudate Color: red, brown Foul Odor After Cleansing: No Slough/Fibrino No Wound Bed Granulation Amount: Large (67-100%) Exposed Structure Necrotic Amount: None Present (0%) Fascia Exposed: No Fat Layer (Subcutaneous Tissue) Exposed: Yes Tendon Exposed: No Muscle Exposed: No Joint Exposed: No Bone Exposed: No Treatment Notes Wound #15 (Toe Great) Wound Laterality: Left Cleanser Soap and Water Discharge Instruction: May shower and wash wound with dial antibacterial soap and water prior to dressing change. Peri-Wound Care Topical Mupirocin Ointment Discharge Instruction: ***In Clinic only***. Apply Mupirocin (Bactroban) under Alignate Ag. Polysporin Discharge Instruction: patient to purchase and home health to apply under the alginate Ag, Primary Dressing KerraCel Ag Gelling Fiber Dressing, 2x2 in (silver alginate) Discharge Instruction: Apply silver alginate to wound bed as  instructed Secondary Dressing Woven Gauze Sponges 2x2 in Discharge Instruction: Apply over primary dressing as directed. Secured With Conforming Stretch Gauze Bandage, Sterile 2x75 (in/in) Discharge Instruction: Secure with stretch gauze as directed. Compression Wrap Compression Stockings Add-Ons Electronic Signature(s) Signed: 01/01/2021 5:12:45 PM By: Sandre Kitty Signed: 01/01/2021 6:21:36 PM By: Deon Pilling Entered By: Sandre Kitty on 01/01/2021 17:01:01 -------------------------------------------------------------------------------- Wound Assessment Details Patient Name: Date of Service: Troy [place] 01/01/2021 8:00 A M Medical Record Number: [id number] Patient Account Number: [id number] Date of Birth/Sex: Treating RN: [birth year] (80 y.o. [name], [name] Primary Care Avagrace Botelho: [name]., [name] Other Clinician: Referring Asti Mackley: Treating Taliana Mersereau/Extender: [name], [name] in Treatment: 64 Wound Status Wound Number: 16R Primary Venous Leg Ulcer Etiology: Wound Location: Right, Lateral Lower Leg Wound Open Wounding Event: Gradually Appeared Status: Date Acquired: 11/13/2020 Comorbid Anemia, Congestive Heart Failure, Hypertension, Peripheral Weeks Of Treatment: 7 History: Venous Disease, End Stage Renal Disease Clustered Wound: No Photos Wound Measurements Length: (cm) 1.1 Width: (cm) 0.8 Depth: (cm) 0.1 Area: (cm) 0.691 Volume: (cm) 0.069 % Reduction in Area: 37.2% % Reduction in Volume: 37.3% Epithelialization: Small (1-33%) Tunneling: No Undermining: No Wound Description Classification: Full Thickness Without Exposed Support Structures Wound Margin: Flat and Intact Exudate Amount: Medium Exudate Type: Serous Exudate Color: amber Foul Odor After Cleansing: No Slough/Fibrino No Wound Bed Granulation Amount: Large (67-100%) Exposed Structure Granulation Quality: Pink Fascia Exposed: No Necrotic Amount:  Small (1-33%) Fat Layer (Subcutaneous Tissue) Exposed: Yes Necrotic Quality: Adherent Slough Tendon Exposed: No Muscle Exposed: No Joint Exposed: No Bone Exposed: No Treatment Notes Wound #16R (Lower Leg) Wound Laterality: Right, Lateral Cleanser Wound Cleanser Discharge Instruction: Cleanse the wound with wound cleanser prior to applying a clean dressing using gauze sponges, not tissue or cotton balls. Soap and Water Discharge Instruction: May shower and wash wound with dial antibacterial soap and water prior to dressing change. Peri-Wound Care Sween Lotion (Moisturizing lotion) Discharge Instruction: Apply moisturizing lotion as directed Topical Primary Dressing KerraCel Ag Gelling Fiber Dressing, 4x5 in (silver alginate) Discharge Instruction: Apply silver alginate to wound bed as instructed Secondary Dressing Woven Gauze Sponge, Non-Sterile 4x4 in Discharge Instruction: Apply over primary dressing as directed. ABD Pad, 5x9 Discharge Instruction:  Apply over primary dressing as directed. Secured With Compression Wrap FourPress (4 layer compression wrap) Discharge Instruction: Apply four layer compression as directed. Compression Stockings Add-Ons Electronic Signature(s) Signed: 01/01/2021 5:12:45 PM By: Sandre Kitty Signed: 01/01/2021 6:21:36 PM By: Deon Pilling Entered By: Sandre Kitty on 01/01/2021 17:00:42 -------------------------------------------------------------------------------- Wound Assessment Details Patient Name: Date of Service: Troy Uvalda [place] 01/01/2021 8:00 [place] Record Number: [id number] Patient Account Number: [id number] Date of Birth/Sex: Treating RN: 06/26/1941 (80 y.o. [name], [name] Primary Care Zaleah Ternes: [name]., [name] Other Clinician: Referring Elenor Wildes: Treating Mylen Mangan/Extender: [name], [name] in Treatment: 64 Wound Status Wound Number: 17 Primary Venous Leg Ulcer Etiology: Wound  Location: Right, Proximal, Lateral Lower Leg Secondary Lymphedema Wounding Event: Blister Etiology: Date Acquired: 12/04/2020 Wound Open Weeks Of Treatment: 3 Status: Clustered Wound: No Comorbid Anemia, Congestive Heart Failure, Hypertension, Peripheral History: Venous Disease, End Stage Renal Disease Photos Wound Measurements Length: (cm) 1 Width: (cm) 0.3 Depth: (cm) 0.1 Area: (cm) 0.236 Volume: (cm) 0.024 % Reduction in Area: 24.8% % Reduction in Volume: 22.6% Epithelialization: Small (1-33%) Tunneling: No Undermining: No Wound Description Classification: Full Thickness Without Exposed Support Structures Wound Margin: Flat and Intact Exudate Amount: Medium Exudate Type: Serous Exudate Color: amber Foul Odor After Cleansing: No Slough/Fibrino No Wound Bed Granulation Amount: Large (67-100%) Exposed Structure Granulation Quality: Pink Fascia Exposed: No Necrotic Amount: Small (1-33%) Fat Layer (Subcutaneous Tissue) Exposed: Yes Necrotic Quality: Adherent Slough Tendon Exposed: No Muscle Exposed: No Joint Exposed: No Bone Exposed: No Treatment Notes Wound #17 (Lower Leg) Wound Laterality: Right, Lateral, Proximal Cleanser Wound Cleanser Discharge Instruction: Cleanse the wound with wound cleanser prior to applying a clean dressing using gauze sponges, not tissue or cotton balls. Soap and Water Discharge Instruction: May shower and wash wound with dial antibacterial soap and water prior to dressing change. Peri-Wound Care Sween Lotion (Moisturizing lotion) Discharge Instruction: Apply moisturizing lotion as directed Topical Primary Dressing KerraCel Ag Gelling Fiber Dressing, 4x5 in (silver alginate) Discharge Instruction: Apply silver alginate to wound bed as instructed Secondary Dressing Woven Gauze Sponge, Non-Sterile 4x4 in Discharge Instruction: Apply over primary dressing as directed. ABD Pad, 5x9 Discharge Instruction: Apply over primary dressing as  directed. Secured With Compression Wrap FourPress (4 layer compression wrap) Discharge Instruction: Apply four layer compression as directed. Compression Stockings Add-Ons Electronic Signature(s) Signed: 01/01/2021 5:12:45 PM By: Sandre Kitty Signed: 01/01/2021 6:21:36 PM By: Deon Pilling Entered By: Sandre Kitty on 01/01/2021 17:00:07 -------------------------------------------------------------------------------- Wound Assessment Details Patient Name: Date of Service: Troy [place], [place] 01/01/2021 8:00 [place] Record Number: [id number] Patient Account Number: [id number] Date of Birth/Sex: Treating RN: 01-04-1941 (80 y.o. [name], [name] Primary Care Doraine Schexnider: [name]., [name] Other Clinician: Referring Kayleana Waites: Treating Lafreda Casebeer/Extender: [name], [name] in Treatment: 64 Wound Status Wound Number: 18 Primary Etiology: Venous Leg Ulcer Wound Location: Right, Distal, Lateral Lower Leg Secondary Etiology: Lymphedema Wounding Event: Blister Wound Status: Open Date Acquired: 12/04/2020 Weeks Of Treatment: 3 Clustered Wound: No Wound Measurements Length: (cm) Width: (cm) Depth: (cm) Area: (cm) Volume: (cm) 0 % Reduction in Area: 100% 0 % Reduction in Volume: 100% 0 0 0 Wound Description Classification: Full Thickness Without Exposed Support Structur es Electronic Signature(s) Signed: 01/01/2021 9:47:30 AM By: Sandre Kitty Signed: 01/01/2021 6:21:36 PM By: Deon Pilling Entered By: Sandre Kitty on 01/01/2021 08:12:51 -------------------------------------------------------------------------------- Wound Assessment Details Patient Name: Date of Service: Troy RRA  [name] [place] 01/01/2021 8:00 A M Medical Record Number: [id number] Patient Account Number: [id number] Date of Birth/Sex: Treating RN: 1941/02/24 (80 y.o. [name], [name] Primary Care Yailen Zemaitis: [name]., [name] Other Clinician: Referring Sonia Bromell: Treating  Swati Granberry/Extender: [name], [name] in Treatment: 64 Wound Status Wound Number: 19 Primary Venous Leg Ulcer Etiology: Wound Location: Left, Posterior Lower Leg Secondary Lymphedema Wounding Event: Blister Etiology: Date Acquired: 12/04/2020 Wound Open Weeks Of Treatment: 3 Status: Clustered Wound: No Comorbid Anemia, Congestive Heart Failure, Hypertension, Peripheral History: Venous Disease, End Stage Renal Disease Photos Wound Measurements Length: (cm) 9 Width: (cm) 8.1 Depth: (cm) 0.1 Area: (cm) 57.256 Volume: (cm) 5.726 % Reduction in Area: -1066.3% % Reduction in Volume: -1066.2% Epithelialization: Small (1-33%) Tunneling: No Undermining: No Wound Description Classification: Full Thickness Without Exposed Support Structures Wound Margin: Flat and Intact Exudate Amount: Large Exudate Type: Serous Exudate Color: amber Foul Odor After Cleansing: No Slough/Fibrino No Wound Bed Granulation Amount: Large (67-100%) Exposed Structure Granulation Quality: Red Fascia Exposed: No Necrotic Amount: None Present (0%) Fat Layer (Subcutaneous Tissue) Exposed: Yes Tendon Exposed: No Muscle Exposed: No Joint Exposed: No Bone Exposed: No Assessment Notes x3 blisters noted to left lower leg. Treatment Notes Wound #19 (Lower Leg) Wound Laterality: Left, Posterior Cleanser Wound Cleanser Discharge Instruction: Cleanse the wound with wound cleanser prior to applying a clean dressing using gauze sponges, not tissue or cotton balls. Soap and Water Discharge Instruction: May shower and wash wound with dial antibacterial soap and water prior to dressing change. Peri-Wound Care Sween Lotion (Moisturizing lotion) Discharge Instruction: Apply moisturizing lotion as directed Zinc Oxide Ointment 30g tube Discharge Instruction: Apply Zinc Oxide to periwound with each dressing change Topical Primary Dressing KerraCel Ag Gelling Fiber Dressing, 4x5 in (silver  alginate) Discharge Instruction: Apply silver alginate to wound bed as instructed Secondary Dressing Woven Gauze Sponge, Non-Sterile 4x4 in Discharge Instruction: Apply over primary dressing as directed. ABD Pad, 5x9 Discharge Instruction: Apply over primary dressing as directed. Secured With Compression Wrap FourPress (4 layer compression wrap) Discharge Instruction: Apply four layer compression as directed. Compression Stockings Add-Ons Electronic Signature(s) Signed: 01/01/2021 5:12:45 PM By: Sandre Kitty Signed: 01/01/2021 6:21:36 PM By: Deon Pilling Entered By: Sandre Kitty on 01/01/2021 17:03:30 -------------------------------------------------------------------------------- Vitals Details Patient Name: Date of Service: Troy M[place]oth, [place] 01/01/2021 8:00 [place] Record Number: [id number] Patient Account Number: [id number] Date of Birth/Sex: Treating RN: 05/02/1941 (80 y.o. [name], [name] Primary Care Abid Bolla: [name]., [name] Other Clinician: Referring Gaberiel Youngblood: Treating Ebonique Hallstrom/Extender: [name], [name] in Treatment: 23 Vital Signs Time Taken: 07:49 Temperature (F): 97.6 Height (in): 71 Pulse (bpm): 102 Weight (lbs): 198 Respiratory Rate (breaths/min): 20 Body Mass Index (BMI): 27.6 Blood Pressure (mmHg): 119/73 Reference Range: 80 - 120 mg / dl Electronic Signature(s) Signed: 01/01/2021 9:47:30 AM By: Sandre Kitty Entered By: Sandre Kitty on 01/01/2021 07:49:18

## 2021-01-08 ENCOUNTER — Other Ambulatory Visit: Payer: Self-pay

## 2021-01-08 ENCOUNTER — Encounter (HOSPITAL_BASED_OUTPATIENT_CLINIC_OR_DEPARTMENT_OTHER): Payer: Medicare Other | Admitting: Internal Medicine

## 2021-01-12 NOTE — Progress Notes (Signed)
NYLEN, CREQUE ([id number]) Visit Report for 01/08/2021 HPI Details Patient Name: Date of Service: [name], [name] 01/08/2021 8:00 A M Medical Record Number: [id number] Patient Account Number: [id number] Date of Birth/Sex: Treating RN: Mar 04, 1941 (80 y.o. [name], Lauren Primary Care Provider: [name]., [name] Other Clinician: Referring Provider: Treating Provider/Extender: [name], [name] in Treatment: 69 History of Present Illness HPI Description: ADMISSION 10/11/2019 This is a 80 year old man with a history of a severe cardiomyopathy with an ejection fraction of about 20%, chronic renal failure stage III. He is listed as a type II diabetic in epic although the patient denies this. He also has a history of PVD. He states for the last month he has had wounds on his bilateral lower extremities that started off as blisters which denuded. He has areas on the left lateral calf and 2 on the right lateral. He has an area on the left first met head which he did not know was there he we identified this on intake. He has been using Silvadene cream provided by his primary care physician but he is complaining that this burns. 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 He was acutely ill at that time complicating GI bleed. ABIs in our clinic were 1.16 on the right and 1.13 on the left 2/25; the patient comes in with his areas on the left lateral and right lateral calf. There is also an area over the left first MTP bunion deformity. We have been using Sorbact. His edema control is fairly good 3/4; left lateral  and right lateral calf. Most of his wounds are in the same position tightly adherent nonviable debris. On the right we debrided the superior wound on the left both wounds. The area on his bunion over the left first MTP medially is I think just about closed. We have been using Sorbact without a lot of success changed to Iodoflex under compression 3/11; left lateral and right lateral calf perhaps minor improvement in the surface condition. We have been using Iodoflex. The area over the bunion of the left first MTP has closed over 3/18; left lateral and right lateral calf not much improvement. We have been using Iodoflex. Aggressive debridement last week. 3/25; left lateral and right lateral calf. We have been using Iodoflex. There is some improvement in the superior area on the right and 2 on the lateral left although there is still a lot of debris on the surface. The inferior area on the right is still a completely nonviable surface. 4/8; left lateral and right lateral calfs. Essentially mirror-image looking wounds 2 wounds on each side in close juxtaposition we have been using Iodoflex with some improvement in the very adherent fibrinous debris but not a lot. The patient has arterial studies next Wednesday morning and venous studies next Thursday morning. I have been avoiding any further aggressive debridement until we see the arterial study results. His ABIs were fairly good in this clinic and is dorsalis pedis pulses are palpable but the wound beds are pale. 4/15; left lateral and right lateral calfs. Essentially mirror-image wounds with 2 wounds on each side in close juxtaposition  but with rims of separating normal tissue. We have been using Iodoflex. The base of the wounds has been cleaning up quite nicely ARTERIAL STUDIES were done showing the patient had an ABI on the right of 1.27 with triphasic waveforms and a TBI of 0.90 on the left triphasic waveforms with an ABI of 1.28 and a TBI of 0.87.  No evidence of arterial disease VENOUS REFLUX STUDIES; were done yesterday we do not have this report yet. 4/23; VENOUS REFLUX STUDIES did not show any significant reflux right lower extremity. No evidence of a DVT He did have significant reflux in the common . femoral vein on the left but nothing else was listed as significant. He did not have a DVT. We have been using Iodoflex under compression his wounds are making progress. 4/29; improvements in the wound surface continue. We have been using Iodoflex. He has a 20% out-of-pocket co-pay for Apligraf which is unfortunate. We changed him to Sorbact today 5/6; started on Sorbact last week. Better looking wound surface but not much change in dimensions. 01/24/20-Patient is back at 3 weeks, wound surfaces are about the same but very minimal slough on the right leg wounds, however there is blistering on both legs adjacent to the wounds, patient denies any other symptoms or new symptoms. His studies have been reviewed and do not indicate any significant arterial or venous disease. But he is attending once in 3 weeks with home health changing in between 6/3; patient has 4 wounds 2 on each side of his lateral lower legs. These wounds are somewhat improved. He apparently arrived in clinic last week with a large blister on the right lateral lower leg that had denuded into the wound. They changed to silver alginate. Still under compression. He has straight Medicare and unfortunately has an unlimited co-pay for advanced treatment options. 6/10; his original 4 wounds are all better especially on the left lateral lower leg. 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 6/24; blister on the right lateral lower leg has healed. There is no additional wounds. The traumatic left second toe is also closed. The 4 that he has are all better 7/1; all of the patient's wounds are smaller except for the proximal one on the left lateral calf. We are using silver collagen 7/22; patient has home health. Relatively refractory wounds on the lateral part of both mid calfs. Each of them 2 wounds. The areas on the left are doing much better in fact the distal 1 looks like it is on his way to closure. The right required debridement with adherent fibrinous debris under illumination. We have been using silver collagen. Previous arterial studies were within normal limits he also had venous reflux studies 04/03/2020 upon evaluation today patient appears to be doing excellent with regard to his lower extremity ulcers. He is going to require a little bit of debridement on the wounds on the right especially in order to clear away some biofilm and slough but this is minimal and overall he seems to be doing quite well. 8/19; the patient's wounds on the left lateral lower leg just above closed. However the 2  on the lateral part of the right have a nonviable necrotic surface. This is disappointing. There is also no change in dimensions. He has had arterial studies as well as venous reflux studies 8/26; very disappointing today. Even the more superficial areas on the left are about the same as last week. Still 2 punched-out areas completely unchanged on the right. We have been using silver collagen really making no progress. 9/2; changed to Iodoflex last week better looking wound surfaces especially on the right although they are deep and punched out. The areas on the left are more superficial open 1 of these is almost fully epithelialized although it has been this way for at least 2 weeks. He has a 20% co-pay [Medicare only] unaffordable for a skin  substitute. Had some thought about a snap VAC on the deep areas on the right leg we will try to put this through in the insurance 9/9; punched-out wounds on the bilateral lateral lower extremities. We have been operating as if the these were chronic venous wounds with secondary lymphedema. The areas on the left have been doing well in fact one of them is closed over. We have had no improvement in the areas on the right we have been using Iodoflex to help with surface debridement. The areas on the right are deeper wider. I do not see any evidence of infection. Home health is not been putting the wraps on high enough he has localized lymphedema right above the wounds. He has had arterial and venous studies already 9/16; punched-out mirror-image wounds on his bilateral lateral mid calfs. We have 1 closed on the left lateral the other smaller. For the first time today the areas on the right lateral actually looks some better. I did a biopsy of 1 of these last time although we still do not have this result. HOWEVER he arrives in clinic today complaining of chest pain overnight which felt like a brick on his chest. He also had 2 black watery bowel movements. He does not have nausea or vomiting. He is not describing abdominal pain. He has no prior history of diarrhea heartburn etc. 9/30; sent this patient to the ER on 9/16. 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 He is on twice daily PPIs Protonix. His admission hemoglobin was exceptionally low at 5. 8.. Discharged at 8.1. 4 unit transfusion. 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 His diuretic dose was actually decreased because of elevated creatinine currently at torsemide 20 mg not felt to be a Coumadin candidate. Patient states he feels well. The areas on his left leg are healed. The areas on the right look better. These have been very refractory  wounds. He does not have stockings we will make arrangements for this today 10/7; the areas on the left leg are healed we will transition him into his 20/30 stocking today. Continued improvement in the areas on the right lateral leg 10/21; the areas on the left leg remain healed at this 2-week follow-up however there is a history that he developed some drainage from one of the wound sites with home health therefore going ahead and wrapping him. He was supposed to be on 20/30 stockings although the history here is vague and I am not sure that this represents a stocking failure or not. We have been wrapping his right leg with the one remaining wound is the distal wound. We  have been using silver collagen 10/28 the patient only has 1 wound remaining on the right lower calf. This still has 3 mm of depth which is unchanged but the surface area is smaller. The superior wound on the right lateral is closed. The 2 wounds on the left remain closed and he is using his compression stocking 11/4 1 remaining wound on the right lower calf. Under illumination not a viable surface we have been using silver collagen we had good effect on the other areas but this 1 appears to be stalled. There is no open area on the left leg he is using his own stocking. 11/11; 1 remaining wound on the right lower calf. Surface of this is a lot better than last week but still requiring debridement I am using Iodoflex but looking forward to changing the primary dressing to perhaps endoform 11/18; 1 remaining wound on the right lower leg, venous insufficiency, vigorous debridement last week/Iodoflex. Depth of the wound is therefore larger but the wound looks clean. Still very gritty material at the surface requiring debridement 12/2; the remaining wound on his right lower leg, venous insufficiency. I changed him to endoform 2 weeks ago. We appear to be making nice progress. Wound is measuring smaller 12/16; he comes in today with one of  his two wounds on the left opened infected second area on the left may be threatened as well as the healed one on the right. He has edema in both legs which I think is pitting. He says his weights are stable he has unknown fairly severe cardiomyopathy. His cardiologist is Dr. Brigitte Pulse it does not sound like he is really been carefully followed 12/23; the areas that I was concerned about on the left look a lot better we put some silver alginate and put him back in compression. It may be the compression that actually does the trick here. The remaining open area we have been using Iodoflex and again this looks a lot better. At our suggestion he went to see his primary who did not adjust his diuretic he is still apparently taking 20 mg of a loop diuretic and "sometimes" 40 mg is basically what the patient stated 1/6; the area on the left is closed once again. We will transition him into his 20/30 stocking from elastic therapy both areas on the right are now open which is a deterioration from last time at which time the only 1 was open but they are very superficial. We have been using Hydrofera Blue under compression 1/27; its been 3 weeks since the patient was here. We transitioned him to a 20/30 stocking on the left leg. Still under 4-layer compression on the right. He comes in today with a new small opening on the left leg but reopening on the right. Massive blistering on the right leg. He has severe systemic fluid volume overload. I have looked over the patient's records. He was seen in 2020 by tele health by cardiology. 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 I think he was supposed to have follow-up but that follow-up never seems to have happened. He does not complain of chest pain or shortness of breath but notes increasing edema not just in his lower legs 2/3; he has new open areas on the bilateral anterior lower legs. I think a lot of this is  because of systemic fluid volume overload. The ultrasound I ordered showed ascites and suggestion of cirrhosis. His physical exam  continues to suggest right greater than left congestive heart failure. I have him taking 40 mg of Lasix oral daily I have asked him to start weighing himself daily. With the intense efforts of our nursing staff we have him a cardiology appointment on Monday or Tuesday of next week I have told him not to miss this. With regards to his wounds I do not think we are going to be able to stop wrapping him until somebody manages to get his fluid volume down 2/10; the areas on his bilateral lateral lower legs actually look better today. He has been taking Lasix 40 mg a day I believe [2 x 20 mg]. He seems to have less edema in his legs and and is a result of this I think he has less edema in his lower legs as well and the wounds appear to be doing better. He comes in today with purulence coming out of the toenail bed of the left great toe it appears that his toenail came off there was noted to be pus I did a culture of this area 2/24; since the patient was last here 2 weeks ago he was admitted to hospital from 2/12 through 2/16 predominantly acute on chronic systolic heart failure. He also has stage IV chronic kidney disease discharge creatinine at 2.71 and on top of this morphology of the liver on ultrasound suggested cirrhosis of unclear etiology. He had 9.4 L of ascites tapped I am unclear whether this was sent for analysis at the time of this dictation. He is currently taking 40 mg of Demadex a day I am a bit surprised that was so low. Nevertheless the wounds on his legs are a lot better everything is closed on the right he still has an open area on the left and then the area on the great toe nail matrix on the left from last time. 10/30/2020 upon evaluation today patient appears to be doing well with regard to his legs other than the fact that they are extremely swollen. He has  multiple blisters noted bilateral lower extremities. Apparently the home health nurse came out yesterday unwrapped them and noticed a couple blisters she called 911 and would not rewrap him. There is no evidence of infection whatsoever we did not receive a phone call that we are aware of anyway regarding the fact that the home health nurse had any concerns at all. Nonetheless the major issue here is that the patient is going to really need to make sure that his fluids under good control but I do not see anything that indicates he needs to go to the hospital right now. There is literally no evidence of infection although fluid is just completely clear. 3/16; patient has home health. Apparently the last time he was here home health did not put compression wraps back on his legs after discovering blisters. By the time he got into see [place] he had had several additional blisters and wounds open up. He was put back in 4 layer compression most of this appears to be healing. He still has small superficial areas on his bilateral lower extremities He also has an area in the left great toe nailbed after lost his toe nail. 3/31; 2-week follow-up. Everything on the right leg is healed he has only has a very small open area remaining on the left leg in one of his original wound sites this should be healed by the next time he is here we have good edema control bilaterally. He  removed his toenail last time he was here. Drainage from this we are using topical antibiotics Finally he has increasing ascites which I think is probably cirrhosis rather than heart failure. He said he was going to go to the emergency room. I suggested that he go to his primary doctor get the primary doctor updated in where this is. If they are going to do paracentesis then that may be able to be done in interventional radiology rather than sitting for hours in the emergency room. He does not look to be in any distress. 4/14; patient returns to  clinic. The stockings he had did not maintain skin integrity on the right he has 3 open wounds here which started on blisters. Predictably everything is closed on the left. He tells me that since he was last here he had to go to the emergency room to deal with the ascites they tapped 8 L of fluid. Again if anybody is aware of the exact etiology of this with certainty the patient is not aware of this. All I know is the ultrasound I did suggested cirrhosis. 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 5/5; patient returns to clinic. Apparently was healed up by home health he did not have his wrap and did not have his stocking and blisters on his leg almost immediately occurred. He has a large open wound on the left anterior leg small area on the left great toe and 2 areas on the right lateral leg which are better than last time 5/12; patient says his overall weight is up 10 pounds. Some of this obviously ascites however he has clear signs of also systemic fluid volume excess. Sees his primary doctor tomorrow at 3:00. He has a large wound on the left medial lower leg extending posteriorly this was the tremendous blister a smaller area laterally 2 areas on the right leg and an area on his left first toe. He has home Chiropractor) Signed: 01/08/2021 3:44:17 PM By: Linton Ham MD Entered By: Linton Ham on 01/08/2021 09:05:31 -------------------------------------------------------------------------------- Physical Exam Details Patient Name: Date of Service: Troy [place] 01/08/2021 8:00 [place] Record Number: [id number] Patient Account Number: [id number] Date of Birth/Sex: Treating RN: 14-Sep-1940 (80 y.o. [name], Lauren Primary Care Provider: [name]., [name] Other Clinician: Referring Provider: Treating Provider/Extender: [name], [name] in Treatment:  45 Constitutional Sitting or standing Blood Pressure is within target range for patient.. Pulse regular and within target range for patient.Marland Kitchen Respirations regular, non-labored and within target range.. Temperature is normal and within the target range for the patient.Marland Kitchen Appears in no distress. Cardiovascular Jugular venous pressure is elevated at 90 degrees sitting. Gastrointestinal (GI) Distended large volume ascites but nontender. Notes Wound exam; he has a large wound on the left medial lower leg extending into the posterior calf area. Smaller area on the lateral. On the right side he has the 2 small open areas which are part of the original wound combination he came in with Also has an area on the left first toe Electronic Signature(s) Signed: 01/08/2021 3:44:17 PM By: Linton Ham MD Entered By: Linton Ham on 01/08/2021 09:06:41 -------------------------------------------------------------------------------- Physician Orders Details Patient Name: Date of Service: Troy [place] 01/08/2021 8:00 [place] Record Number: [id number] Patient Account Number: [id number] Date of Birth/Sex: Treating RN: 1941-08-14 (80 y.o. [name], Lauren Primary  Care Provider: [name]., [name] Other Clinician: Referring Provider: Treating Provider/Extender: [name], [name] in Treatment: 30 Verbal / Phone Orders: No Diagnosis Coding Follow-up Appointments ppointment in 2 weeks. - Thursday with Dr. Dellia Nims Return A Bathing/ Shower/ Hygiene May shower with protection but do not get wound dressing(s) wet. May shower and wash wound with soap and water. - with dressing changes only. Edema Control - Lymphedema / SCD / Other Elevate legs to the level of the heart or above for 30 minutes daily and/or when sitting, a frequency of: - 3-4 times a day throughout the day. Avoid standing for long periods of time. Exercise regularly Moisturize legs daily. - right leg  every night. Compression stocking or Garment 20-30 mm/Hg pressure to: - ****if wounds close patient and home health to apply compression stocking to the leg.*** Home Health No change in wound care orders this week; continue Home Health for wound care. May utilize formulary equivalent dressing for wound treatment orders unless otherwise specified. - [facility] home health twice a week. ****if wounds close on a leg patient and home health to please apply compression stocking to the leg. patient cannot go without compression to legs.*** Please cover any blisters with calcium alginate Ag. Wound Treatment Wound #15 - T Great oe Wound Laterality: Left Cleanser: Soap and Water (Home Health) 2 x Per Day/15 Days Discharge Instructions: May shower and wash wound with dial antibacterial soap and water prior to dressing change. Topical: Mupirocin Ointment 2 x Per Day/15 Days Discharge Instructions: ***In Clinic only***. Apply Mupirocin (Bactroban) under Alignate Ag. Topical: Polysporin (Home Health) 2 x Per Day/15 Days Discharge Instructions: patient to purchase and home health to apply under the alginate Ag, Prim Dressing: KerraCel Ag Gelling Fiber Dressing, 2x2 in (silver alginate) ([place]) 2 x Per Day/15 Days ary Discharge Instructions: Apply silver alginate to wound bed as instructed Secondary Dressing: Woven Gauze Sponges 2x2 in ([place]) 2 x Per Day/15 Days Discharge Instructions: Apply over primary dressing as directed. Secured With: Child psychotherapist, Sterile 2x75 (in/in) (Home Health) 2 x Per Day/15 Days Discharge Instructions: Secure with stretch gauze as directed. Wound #16R - Lower Leg Wound Laterality: Right, Lateral Cleanser: Soap and Water (Home Health) 2 x Per Week/15 Days Discharge Instructions: May shower and wash wound with dial antibacterial soap and water prior to dressing change. Cleanser: Wound Cleanser (Home Health) 2 x Per Week/15 Days Discharge  Instructions: Cleanse the wound with wound cleanser prior to applying a clean dressing using gauze sponges, not tissue or cotton balls. Peri-Wound Care: Sween Lotion (Moisturizing lotion) (Home Health) 2 x Per Week/15 Days Discharge Instructions: Apply moisturizing lotion as directed Prim Dressing: KerraCel Ag Gelling Fiber Dressing, 4x5 in (silver alginate) (Home Health) 2 x Per Week/15 Days ary Discharge Instructions: Apply silver alginate to wound bed as instructed Secondary Dressing: Woven Gauze Sponge, Non-Sterile 4x4 in (Home Health) 2 x Per Week/15 Days Discharge Instructions: Apply over primary dressing as directed. Secondary Dressing: ABD Pad, 5x9 (Home Health) 2 x Per Week/15 Days Discharge Instructions: Apply over primary dressing as directed. Compression Wrap: FourPress (4 layer compression wrap) (Home Health) 2 x Per Week/15 Days Discharge Instructions: Apply four layer compression as directed. Wound #17 - Lower Leg Wound Laterality: Right, Lateral, Proximal Cleanser: Soap and Water (Home Health) 2 x Per Week/15 Days Discharge Instructions: May shower and wash wound with dial antibacterial soap and water prior to dressing change. Cleanser: Wound Cleanser (Home Health)  2 x Per Week/15 Days Discharge Instructions: Cleanse the wound with wound cleanser prior to applying a clean dressing using gauze sponges, not tissue or cotton balls. Peri-Wound Care: Sween Lotion (Moisturizing lotion) (Home Health) 2 x Per Week/15 Days Discharge Instructions: Apply moisturizing lotion as directed Prim Dressing: KerraCel Ag Gelling Fiber Dressing, 4x5 in (silver alginate) (Home Health) 2 x Per Week/15 Days ary Discharge Instructions: Apply silver alginate to wound bed as instructed Secondary Dressing: Woven Gauze Sponge, Non-Sterile 4x4 in (Home Health) 2 x Per Week/15 Days Discharge Instructions: Apply over primary dressing as directed. Secondary Dressing: ABD Pad, 5x9 (Home Health) 2 x Per Week/15  Days Discharge Instructions: Apply over primary dressing as directed. Compression Wrap: FourPress (4 layer compression wrap) (Home Health) 2 x Per Week/15 Days Discharge Instructions: Apply four layer compression as directed. Wound #19 - Lower Leg Wound Laterality: Left, Posterior Cleanser: Soap and Water (Home Health) 2 x Per Week/15 Days Discharge Instructions: May shower and wash wound with dial antibacterial soap and water prior to dressing change. Cleanser: Wound Cleanser (Home Health) 2 x Per Week/15 Days Discharge Instructions: Cleanse the wound with wound cleanser prior to applying a clean dressing using gauze sponges, not tissue or cotton balls. Peri-Wound Care: Zinc Oxide Ointment 30g tube (Home Health) 2 x Per Week/15 Days Discharge Instructions: Apply Zinc Oxide to periwound with each dressing change Peri-Wound Care: Sween Lotion (Moisturizing lotion) (Home Health) 2 x Per Week/15 Days Discharge Instructions: Apply moisturizing lotion as directed Prim Dressing: KerraCel Ag Gelling Fiber Dressing, 4x5 in (silver alginate) (Home Health) 2 x Per Week/15 Days ary Discharge Instructions: Apply silver alginate to wound bed as instructed Secondary Dressing: Woven Gauze Sponge, Non-Sterile 4x4 in (Home Health) 2 x Per Week/15 Days Discharge Instructions: Apply over primary dressing as directed. Secondary Dressing: ABD Pad, 5x9 (Home Health) 2 x Per Week/15 Days Discharge Instructions: Apply over primary dressing as directed. Compression Wrap: FourPress (4 layer compression wrap) (Home Health) 2 x Per Week/15 Days Discharge Instructions: Apply four layer compression as directed. Electronic Signature(s) Signed: 01/08/2021 3:44:17 PM By: Linton Ham MD Signed: 01/12/2021 6:08:57 PM By: Rhae Hammock RN Entered By: Rhae Hammock on 01/08/2021 08:56:11 -------------------------------------------------------------------------------- Problem List Details Patient Name: Date of  Service: Troy [place], [place] 01/08/2021 8:00 [place] Number: [id number] Patient Account Number: [id number] Date of Birth/Sex: Treating RN: 11-13-1940 (80 y.o. 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 10/11/2019 No Yes extremity I89.0 Lymphedema, not elsewhere classified 10/11/2019 No Yes L97.822 Non-pressure chronic ulcer of other part of left lower leg with fat layer exposed2/06/2020 No Yes L97.812 Non-pressure chronic ulcer of other part of right lower leg with fat layer 01/03/2020 No Yes exposed F42.39 Chronic systolic (congestive) heart failure 10/02/2020 No Yes S90.812D Abrasion, left foot, subsequent encounter 10/09/2020 No Yes Inactive Problems ICD-10 Code Description Active Date Inactive Date L97.521 Non-pressure chronic ulcer of other part of left foot limited to breakdown of skin 10/11/2019 10/11/2019 S90.812D Abrasion, left foot, subsequent encounter 02/07/2020 02/07/2020 I42.9 Cardiomyopathy, unspecified 05/15/2020 05/15/2020 K92.1 Melena 05/15/2020 05/15/2020 Resolved Problems ICD-10 Code Description Active Date Resolved Date 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 10/11/2019 10/11/2019 Electronic Signature(s) Signed: 01/08/2021 3:44:17 PM By: Linton Ham MD Entered  By: Linton Ham on 01/08/2021 09:04:16 -------------------------------------------------------------------------------- Progress Note Details Patient Name: Date of Service: Troy [place] [name] [facility] 01/08/2021 8:00 [place] Record Number: [id number] Patient Account Number: [id number] Date of Birth/Sex: Treating RN: [birth year] (80 y.o. [name], Lauren Primary  Care Provider: [name]., [name] Other Clinician: Referring Provider: Treating Provider/Extender: [name], [name] in Treatment: 44 Subjective History of Present Illness (HPI) ADMISSION 10/11/2019 This is a 80 year old man with a history of a severe cardiomyopathy with an ejection fraction of about 20%, chronic renal failure stage III. He is listed as a type II diabetic in epic although the patient denies this. He also has a history of PVD. He states for the last month he has had wounds on his bilateral lower extremities that started off as blisters which denuded. He has areas on the left lateral calf and 2 on the right lateral. He has an area on the left first met head which he did not know was there he we identified this on intake. He has been using Silvadene cream provided by his primary care physician but he is complaining that this burns. 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 He was acutely ill at that time complicating GI bleed. ABIs in our clinic were 1.16 on the right and 1.13 on the left 2/25; the patient comes in with his areas on the left lateral and right lateral calf. There is also an area over the left first MTP bunion deformity. We have been using Sorbact. His edema control is fairly good 3/4; left lateral and right lateral calf. Most of his wounds are in the same position tightly adherent nonviable debris. On the right we debrided the superior wound on the left both wounds. The area on his bunion over the left first MTP medially is I think just about closed. We have been using Sorbact without a  lot of success changed to Iodoflex under compression 3/11; left lateral and right lateral calf perhaps minor improvement in the surface condition. We have been using Iodoflex. The area over the bunion of the left first MTP has closed over 3/18; left lateral and right lateral calf not much improvement. We have been using Iodoflex. Aggressive debridement last week. 3/25; left lateral and right lateral calf. We have been using Iodoflex. There is some improvement in the superior area on the right and 2 on the lateral left although there is still a lot of debris on the surface. The inferior area on the right is still a completely nonviable surface. 4/8; left lateral and right lateral calfs. Essentially mirror-image looking wounds 2 wounds on each side in close juxtaposition we have been using Iodoflex with some improvement in the very adherent fibrinous debris but not a lot. The patient has arterial studies next Wednesday morning and venous studies next Thursday morning. I have been avoiding any further aggressive debridement until we see the arterial study results. His ABIs were fairly good in this clinic and is dorsalis pedis pulses are palpable but the wound beds are pale. 4/15; left lateral and right lateral calfs. Essentially mirror-image wounds with 2 wounds on each side in  close juxtaposition but with rims of separating normal tissue. We have been using Iodoflex. The base of the wounds has been cleaning up quite nicely ARTERIAL STUDIES were done showing the patient had an ABI on the right of 1.27 with triphasic waveforms and a TBI of 0.90 on the left triphasic waveforms with an ABI of 1.28 and a TBI of 0.87. No evidence of arterial disease VENOUS REFLUX STUDIES; were done yesterday we do not have this report yet. 4/23; VENOUS REFLUX STUDIES did not show any significant reflux right lower extremity. No evidence of a DVT He did have significant reflux in the common . femoral vein on the left but  nothing else was listed as significant. He did not have a DVT. We have been using Iodoflex under compression his wounds are making progress. 4/29; improvements in the wound surface continue. We have been using Iodoflex. He has a 20% out-of-pocket co-pay for Apligraf which is unfortunate. We changed him to Sorbact today 5/6; started on Sorbact last week. Better looking wound surface but not much change in dimensions. 01/24/20-Patient is back at 3 weeks, wound surfaces are about the same but very minimal slough on the right leg wounds, however there is blistering on both legs adjacent to the wounds, patient denies any other symptoms or new symptoms. His studies have been reviewed and do not indicate any significant arterial or venous disease. But he is attending once in 3 weeks with home health changing in between 6/3; patient has 4 wounds 2 on each side of his lateral lower legs. These wounds are somewhat improved. He apparently arrived in clinic last week with a large blister on the right lateral lower leg that had denuded into the wound. They changed to silver alginate. Still under compression. He has straight Medicare and unfortunately has an unlimited co-pay for advanced treatment options. 6/10; his original 4 wounds are all better especially on the left lateral lower leg. 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 6/24; blister on the right lateral lower leg has healed. There is no additional wounds. The traumatic left second toe is also closed. The 4 that he has are all better 7/1; all of the patient's wounds are smaller except for the proximal one on the  left lateral calf. We are using silver collagen 7/22; patient has home health. Relatively refractory wounds on the lateral part of both mid calfs. Each of them 2 wounds. The areas on the left are doing much better in fact the distal 1 looks like it is on his way to closure. The right required debridement with adherent fibrinous debris under illumination. We have been using silver collagen. Previous arterial studies were within normal limits he also had venous reflux studies 04/03/2020 upon evaluation today patient appears to be doing excellent with regard to his lower extremity ulcers. He is going to require a little bit of debridement on the wounds on the right especially in order to clear away some biofilm and slough but this is minimal and overall he seems to be doing quite well. 8/19; the patient's wounds on the left lateral lower leg just above closed. However  the 2 on the lateral part of the right have a nonviable necrotic surface. This is disappointing. There is also no change in dimensions. He has had arterial studies as well as venous reflux studies 8/26; very disappointing today. Even the more superficial areas on the left are about the same as last week. Still 2 punched-out areas completely unchanged on the right. We have been using silver collagen really making no progress. 9/2; changed to Iodoflex last week better looking wound surfaces especially on the right although they are deep and punched out. The areas on the left are more superficial open 1 of these is almost fully epithelialized although it has been this way for at least 2 weeks. He has a 20% co-pay [Medicare only] unaffordable for a skin substitute. Had some thought about a snap VAC on the deep areas on the right leg we will try to put this through in the insurance 9/9; punched-out wounds on the bilateral lateral lower extremities. We have been operating as if the these were chronic venous wounds with secondary lymphedema. The  areas on the left have been doing well in fact one of them is closed over. We have had no improvement in the areas on the right we have been using Iodoflex to help with surface debridement. The areas on the right are deeper wider. I do not see any evidence of infection. Home health is not been putting the wraps on high enough he has localized lymphedema right above the wounds. He has had arterial and venous studies already 9/16; punched-out mirror-image wounds on his bilateral lateral mid calfs. We have 1 closed on the left lateral the other smaller. For the first time today the areas on the right lateral actually looks some better. I did a biopsy of 1 of these last time although we still do not have this result. HOWEVER he arrives in clinic today complaining of chest pain overnight which felt like a brick on his chest. He also had 2 black watery bowel movements. He does not have nausea or vomiting. He is not describing abdominal pain. He has no prior history of diarrhea heartburn etc. 9/30; sent this patient to the ER on 9/16. 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 He is on twice daily PPIs Protonix. His admission hemoglobin was exceptionally low at 5. 8.. Discharged at 8.1. 4 unit transfusion. 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 His diuretic dose was actually decreased because of elevated creatinine currently at torsemide 20 mg not felt to be a Coumadin candidate. Patient states he feels well. The areas on his left leg are healed. The areas on the right look better. These have been very refractory wounds. He does not have stockings we will make arrangements for this today 10/7; the areas on the left leg are healed we will transition him into his 20/30 stocking today. Continued improvement in the areas on the right lateral leg 10/21; the areas on the left leg remain healed at this 2-week  follow-up however there is a history that he developed some drainage from one of the wound sites with home health therefore going ahead and wrapping him. He was supposed to be on 20/30 stockings although the history here is vague and I am not sure that this represents a stocking failure or not. We have been wrapping his right leg with the one remaining wound is the distal  wound. We have been using silver collagen 10/28 the patient only has 1 wound remaining on the right lower calf. This still has 3 mm of depth which is unchanged but the surface area is smaller. The superior wound on the right lateral is closed. The 2 wounds on the left remain closed and he is using his compression stocking 11/4 1 remaining wound on the right lower calf. Under illumination not a viable surface we have been using silver collagen we had good effect on the other areas but this 1 appears to be stalled. There is no open area on the left leg he is using his own stocking. 11/11; 1 remaining wound on the right lower calf. Surface of this is a lot better than last week but still requiring debridement I am using Iodoflex but looking forward to changing the primary dressing to perhaps endoform 11/18; 1 remaining wound on the right lower leg, venous insufficiency, vigorous debridement last week/Iodoflex. Depth of the wound is therefore larger but the wound looks clean. Still very gritty material at the surface requiring debridement 12/2; the remaining wound on his right lower leg, venous insufficiency. I changed him to endoform 2 weeks ago. We appear to be making nice progress. Wound is measuring smaller 12/16; he comes in today with one of his two wounds on the left opened infected second area on the left may be threatened as well as the healed one on the right. He has edema in both legs which I think is pitting. He says his weights are stable he has unknown fairly severe cardiomyopathy. His cardiologist is Dr. Brigitte Pulse it does not  sound like he is really been carefully followed 12/23; the areas that I was concerned about on the left look a lot better we put some silver alginate and put him back in compression. It may be the compression that actually does the trick here. The remaining open area we have been using Iodoflex and again this looks a lot better. At our suggestion he went to see his primary who did not adjust his diuretic he is still apparently taking 20 mg of a loop diuretic and "sometimes" 40 mg is basically what the patient stated 1/6; the area on the left is closed once again. We will transition him into his 20/30 stocking from elastic therapy both areas on the right are now open which is a deterioration from last time at which time the only 1 was open but they are very superficial. We have been using Hydrofera Blue under compression 1/27; its been 3 weeks since the patient was here. We transitioned him to a 20/30 stocking on the left leg. Still under 4-layer compression on the right. He comes in today with a new small opening on the left leg but reopening on the right. Massive blistering on the right leg. He has severe systemic fluid volume overload. I have looked over the patient's records. He was seen in 2020 by tele health by cardiology. 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 I think he was supposed to have follow-up but that follow-up never seems to have happened. He does not complain of chest pain or shortness of breath but notes increasing edema not just in his lower legs 2/3; he has new open areas on the bilateral anterior lower legs. I think a lot of this is because of systemic fluid volume overload. The ultrasound I ordered showed ascites and suggestion of cirrhosis. His physical  exam continues to suggest right greater than left congestive heart failure. I have him taking 40 mg of Lasix oral daily I have asked him to start weighing himself daily.  With the intense efforts of our nursing staff we have him a cardiology appointment on Monday or Tuesday of next week I have told him not to miss this. With regards to his wounds I do not think we are going to be able to stop wrapping him until somebody manages to get his fluid volume down 2/10; the areas on his bilateral lateral lower legs actually look better today. He has been taking Lasix 40 mg a day I believe [2 x 20 mg]. He seems to have less edema in his legs and and is a result of this I think he has less edema in his lower legs as well and the wounds appear to be doing better. He comes in today with purulence coming out of the toenail bed of the left great toe it appears that his toenail came off there was noted to be pus I did a culture of this area 2/24; since the patient was last here 2 weeks ago he was admitted to hospital from 2/12 through 2/16 predominantly acute on chronic systolic heart failure. He also has stage IV chronic kidney disease discharge creatinine at 2.71 and on top of this morphology of the liver on ultrasound suggested cirrhosis of unclear etiology. He had 9.4 L of ascites tapped I am unclear whether this was sent for analysis at the time of this dictation. He is currently taking 40 mg of Demadex a day I am a bit surprised that was so low. Nevertheless the wounds on his legs are a lot better everything is closed on the right he still has an open area on the left and then the area on the great toe nail matrix on the left from last time. 10/30/2020 upon evaluation today patient appears to be doing well with regard to his legs other than the fact that they are extremely swollen. He has multiple blisters noted bilateral lower extremities. Apparently the home health nurse came out yesterday unwrapped them and noticed a couple blisters she called 911 and would not rewrap him. There is no evidence of infection whatsoever we did not receive a phone call that we are aware of anyway  regarding the fact that the home health nurse had any concerns at all. Nonetheless the major issue here is that the patient is going to really need to make sure that his fluids under good control but I do not see anything that indicates he needs to go to the hospital right now. There is literally no evidence of infection although fluid is just completely clear. 3/16; patient has home health. Apparently the last time he was here home health did not put compression wraps back on his legs after discovering blisters. By the time he got into see [place] he had had several additional blisters and wounds open up. He was put back in 4 layer compression most of this appears to be healing. He still has small superficial areas on his bilateral lower extremities He also has an area in the left great toe nailbed after lost his toe nail. 3/31; 2-week follow-up. Everything on the right leg is healed he has only has a very small open area remaining on the left leg in one of his original wound sites this should be healed by the next time he is here we have good edema control  bilaterally. He removed his toenail last time he was here. Drainage from this we are using topical antibiotics Finally he has increasing ascites which I think is probably cirrhosis rather than heart failure. He said he was going to go to the emergency room. I suggested that he go to his primary doctor get the primary doctor updated in where this is. If they are going to do paracentesis then that may be able to be done in interventional radiology rather than sitting for hours in the emergency room. He does not look to be in any distress. 4/14; patient returns to clinic. The stockings he had did not maintain skin integrity on the right he has 3 open wounds here which started on blisters. Predictably everything is closed on the left. He tells me that since he was last here he had to go to the emergency room to deal with the ascites they tapped 8 L of  fluid. Again if anybody is aware of the exact etiology of this with certainty the patient is not aware of this. All I know is the ultrasound I did suggested cirrhosis. 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 5/5; patient returns to clinic. Apparently was healed up by home health he did not have his wrap and did not have his stocking and blisters on his leg almost immediately occurred. He has a large open wound on the left anterior leg small area on the left great toe and 2 areas on the right lateral leg which are better than last time 5/12; patient says his overall weight is up 10 pounds. Some of this obviously ascites however he has clear signs of also systemic fluid volume excess. Sees his primary doctor tomorrow at 3:00. He has a large wound on the left medial lower leg extending posteriorly this was the tremendous blister a smaller area laterally 2 areas on the right leg and an area on his left first toe. He has home health Objective Constitutional Sitting or standing Blood Pressure is within target range for patient.. Pulse regular and within target range for patient.Marland Kitchen Respirations regular, non-labored and within target range.. Temperature is normal and within the target range for the patient.Marland Kitchen Appears in no distress. Vitals Time Taken: 7:48 AM, Height: 71 in, Weight: 198 lbs, BMI: 27.6, Temperature: 98.1 F, Pulse: 91 bpm, Respiratory Rate: 20 breaths/min, Blood Pressure: 107/74 mmHg. Cardiovascular Jugular venous pressure is elevated at 90 degrees sitting. Gastrointestinal (GI) Distended large volume ascites but nontender. General Notes: Wound exam; he has a large wound on the left medial lower leg extending into the posterior calf area. Smaller area on the lateral. oo On the right side he has the 2 small open areas which are part of the original wound combination he came in with oo Also has an area on the left first  toe Integumentary (Hair, Skin) Wound #15 status is Open. Original cause of wound was Gradually Appeared. The date acquired was: 10/09/2020. The wound has been in treatment 13 weeks. The wound is located on the Left T Great. The wound measures 0.7cm length x 1cm width x 0.1cm depth; 0.55cm^2 area and 0.055cm^3 volume. There is Fat oe Layer (Subcutaneous Tissue) exposed. There is no tunneling or undermining noted. There is a small amount of serous drainage noted. The wound margin is flat and intact. There is medium (34-66%) pink granulation within the wound bed. There is a medium (34-66%) amount of necrotic tissue  within the wound bed including Adherent Slough. Wound #16R status is Open. Original cause of wound was Gradually Appeared. The date acquired was: 11/13/2020. The wound has been in treatment 8 weeks. The wound is located on the Right,Lateral Lower Leg. The wound measures 1.5cm length x 1.3cm width x 0.1cm depth; 1.532cm^2 area and 0.153cm^3 volume. There is Fat Layer (Subcutaneous Tissue) exposed. There is no tunneling or undermining noted. There is a medium amount of serous drainage noted. The wound margin is flat and intact. There is medium (34-66%) pink granulation within the wound bed. There is a medium (34-66%) amount of necrotic tissue within the wound bed including Adherent Slough. Wound #17 status is Open. Original cause of wound was Blister. The date acquired was: 12/04/2020. The wound has been in treatment 4 weeks. The wound is located on the Right,Proximal,Lateral Lower Leg. The wound measures 0.9cm length x 0.5cm width x 0.1cm depth; 0.353cm^2 area and 0.035cm^3 volume. There is Fat Layer (Subcutaneous Tissue) exposed. There is no tunneling or undermining noted. There is a medium amount of serous drainage noted. The wound margin is flat and intact. There is large (67-100%) pink granulation within the wound bed. There is a small (1-33%) amount of necrotic tissue within the wound bed  including Adherent Slough. Wound #19 status is Open. Original cause of wound was Blister. The date acquired was: 12/04/2020. The wound has been in treatment 4 weeks. The wound is located on the Left,Posterior Lower Leg. The wound measures 8.5cm length x 10cm width x 0.1cm depth; 66.759cm^2 area and 6.676cm^3 volume. There is Fat Layer (Subcutaneous Tissue) exposed. There is no tunneling or undermining noted. There is a large amount of serous drainage noted. The wound margin is flat and intact. There is medium (34-66%) red, pink granulation within the wound bed. There is no necrotic tissue within the wound bed. Wound #20 status is Open. Original cause of wound was Blister. The date acquired was: 01/08/2021. The wound is located on the Left,Lateral Lower Leg. The wound measures 2.3cm length x 4.8cm width x 0.1cm depth; 8.671cm^2 area and 0.867cm^3 volume. There is Fat Layer (Subcutaneous Tissue) exposed. There is no tunneling or undermining noted. There is a medium amount of serous drainage noted. The wound margin is flat and intact. There is large (67-100%) pink granulation within the wound bed. There is a small (1-33%) amount of necrotic tissue within the wound bed including Adherent Slough. Assessment Active Problems ICD-10 Chronic venous hypertension (idiopathic) with inflammation of bilateral lower extremity Lymphedema, not elsewhere classified Non-pressure chronic ulcer of other part of left lower leg with fat layer exposed Non-pressure chronic ulcer of other part of right lower leg with fat layer exposed Chronic systolic (congestive) heart failure Abrasion, left foot, subsequent encounter Procedures Wound #16R Pre-procedure diagnosis of Wound #16R is a Venous Leg Ulcer located on the Right,Lateral Lower Leg . There was a Four Layer Compression Therapy Procedure by Rhae Hammock, RN. Post procedure Diagnosis Wound #16R: Same as Pre-Procedure Wound #17 Pre-procedure diagnosis of Wound #17  is a Venous Leg Ulcer located on the Right,Proximal,Lateral Lower Leg . There was a Four Layer Compression Therapy Procedure by Rhae Hammock, RN. Post procedure Diagnosis Wound #17: Same as Pre-Procedure Wound #19 Pre-procedure diagnosis of Wound #19 is a Venous Leg Ulcer located on the Left,Posterior Lower Leg . There was a Four Layer Compression Therapy Procedure by Rhae Hammock, RN. Post procedure Diagnosis Wound #19: Same as Pre-Procedure Wound #20 Pre-procedure diagnosis of Wound #20 is a Venous  Leg Ulcer located on the Left,Lateral Lower Leg . There was a Four Layer Compression Therapy Procedure by Rhae Hammock, RN. Post procedure Diagnosis Wound #20: Same as Pre-Procedure Plan Follow-up Appointments: Return Appointment in 2 weeks. - Thursday with Dr. Arcola Jansky Shower/ Hygiene: May shower with protection but do not get wound dressing(s) wet. May shower and wash wound with soap and water. - with dressing changes only. Edema Control - Lymphedema / SCD / Other: Elevate legs to the level of the heart or above for 30 minutes daily and/or when sitting, a frequency of: - 3-4 times a day throughout the day. Avoid standing for long periods of time. Exercise regularly Moisturize legs daily. - right leg every night. Compression stocking or Garment 20-30 mm/Hg pressure to: - ****if wounds close patient and home health to apply compression stocking to the leg.*** Home Health: No change in wound care orders this week; continue Home Health for wound care. May utilize formulary equivalent dressing for wound treatment orders unless otherwise specified. - [facility] home health twice a week. ****if wounds close on a leg patient and home health to please apply compression stocking to the leg. patient cannot go without compression to legs.*** Please cover any blisters with calcium alginate Ag. WOUND #15: - T Great Wound Laterality: Left oe Cleanser: Soap and Water (Home Health) 2 x  Per XIP/38 Days Discharge Instructions: May shower and wash wound with dial antibacterial soap and water prior to dressing change. Topical: Mupirocin Ointment 2 x Per Day/15 Days Discharge Instructions: ***In Clinic only***. Apply Mupirocin (Bactroban) under Alignate Ag. Topical: Polysporin (Home Health) 2 x Per Day/15 Days Discharge Instructions: patient to purchase and home health to apply under the alginate Ag, Prim Dressing: KerraCel Ag Gelling Fiber Dressing, 2x2 in (silver alginate) ([place]) 2 x Per Day/15 Days ary Discharge Instructions: Apply silver alginate to wound bed as instructed Secondary Dressing: Woven Gauze Sponges 2x2 in ([place]) 2 x Per Day/15 Days Discharge Instructions: Apply over primary dressing as directed. Secured With: Child psychotherapist, Sterile 2x75 (in/in) (Home Health) 2 x Per Day/15 Days Discharge Instructions: Secure with stretch gauze as directed. WOUND #16R: - Lower Leg Wound Laterality: Right, Lateral Cleanser: Soap and Water (Home Health) 2 x Per Week/15 Days Discharge Instructions: May shower and wash wound with dial antibacterial soap and water prior to dressing change. Cleanser: Wound Cleanser (Home Health) 2 x Per Week/15 Days Discharge Instructions: Cleanse the wound with wound cleanser prior to applying a clean dressing using gauze sponges, not tissue or cotton balls. Peri-Wound Care: Sween Lotion (Moisturizing lotion) (Home Health) 2 x Per Week/15 Days Discharge Instructions: Apply moisturizing lotion as directed Prim Dressing: KerraCel Ag Gelling Fiber Dressing, 4x5 in (silver alginate) (Home Health) 2 x Per Week/15 Days ary Discharge Instructions: Apply silver alginate to wound bed as instructed Secondary Dressing: Woven Gauze Sponge, Non-Sterile 4x4 in (Home Health) 2 x Per Week/15 Days Discharge Instructions: Apply over primary dressing as directed. Secondary Dressing: ABD Pad, 5x9 (Home Health) 2 x Per Week/15  Days Discharge Instructions: Apply over primary dressing as directed. Com pression Wrap: FourPress (4 layer compression wrap) (Home Health) 2 x Per Week/15 Days Discharge Instructions: Apply four layer compression as directed. WOUND #17: - Lower Leg Wound Laterality: Right, Lateral, Proximal Cleanser: Soap and Water (Home Health) 2 x Per Week/15 Days Discharge Instructions: May shower and wash wound with dial antibacterial soap and water prior to dressing change. Cleanser: Wound Cleanser (Home Health) 2 x Per  Week/15 Days Discharge Instructions: Cleanse the wound with wound cleanser prior to applying a clean dressing using gauze sponges, not tissue or cotton balls. Peri-Wound Care: Sween Lotion (Moisturizing lotion) (Home Health) 2 x Per Week/15 Days Discharge Instructions: Apply moisturizing lotion as directed Prim Dressing: KerraCel Ag Gelling Fiber Dressing, 4x5 in (silver alginate) (Home Health) 2 x Per Week/15 Days ary Discharge Instructions: Apply silver alginate to wound bed as instructed Secondary Dressing: Woven Gauze Sponge, Non-Sterile 4x4 in (Home Health) 2 x Per Week/15 Days Discharge Instructions: Apply over primary dressing as directed. Secondary Dressing: ABD Pad, 5x9 (Home Health) 2 x Per Week/15 Days Discharge Instructions: Apply over primary dressing as directed. Com pression Wrap: FourPress (4 layer compression wrap) (Home Health) 2 x Per Week/15 Days Discharge Instructions: Apply four layer compression as directed. WOUND #19: - Lower Leg Wound Laterality: Left, Posterior Cleanser: Soap and Water (Home Health) 2 x Per Week/15 Days Discharge Instructions: May shower and wash wound with dial antibacterial soap and water prior to dressing change. Cleanser: Wound Cleanser (Home Health) 2 x Per Week/15 Days Discharge Instructions: Cleanse the wound with wound cleanser prior to applying a clean dressing using gauze sponges, not tissue or cotton balls. Peri-Wound Care: Zinc  Oxide Ointment 30g tube (Home Health) 2 x Per Week/15 Days Discharge Instructions: Apply Zinc Oxide to periwound with each dressing change Peri-Wound Care: Sween Lotion (Moisturizing lotion) (Home Health) 2 x Per Week/15 Days Discharge Instructions: Apply moisturizing lotion as directed Prim Dressing: KerraCel Ag Gelling Fiber Dressing, 4x5 in (silver alginate) (Home Health) 2 x Per Week/15 Days ary Discharge Instructions: Apply silver alginate to wound bed as instructed Secondary Dressing: Woven Gauze Sponge, Non-Sterile 4x4 in (Home Health) 2 x Per Week/15 Days Discharge Instructions: Apply over primary dressing as directed. Secondary Dressing: ABD Pad, 5x9 (Home Health) 2 x Per Week/15 Days Discharge Instructions: Apply over primary dressing as directed. Com pression Wrap: FourPress (4 layer compression wrap) (Home Health) 2 x Per Week/15 Days Discharge Instructions: Apply four layer compression as directed. 1. Continue with the same dressings which are silver alginate. 2. He has severe pitting edema up into his thighs. This is obviously a combination of factors including congestive heart failure, chronic renal failure. He is going to need adjustments of his diuretics and a paracentesis 3. As long as he maintains this degree of systemic fluid volume overload what ever the cause we are not going to be able to maintain skin integrity in his legs there is no way to transition this man to stockings with the amount of fluid in his thighs 4. 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 [name], Lauren Primary Care Provider: [name]., [name] Other Clinician: Referring Provider: Treating Provider/Extender: [name], [name] in Treatment: 35 Diagnosis Coding ICD-10 Codes Code Description [phone] Chronic venous hypertension (idiopathic) with inflammation of bilateral lower extremity I89.0 Lymphedema, not elsewhere classified L97.822 Non-pressure chronic ulcer of other part of left lower  leg with fat layer exposed L97.812 Non-pressure chronic ulcer of other part of right lower leg with fat layer exposed F18.86 Chronic systolic (congestive) heart failure S90.812D Abrasion, left foot, subsequent encounter Physician Procedures : CPT4 Code Description Modifier [id number] - WC PHYS LEVEL 4 - EST PT ICD-10 Diagnosis Description L97.822 Non-pressure chronic ulcer of other part of left lower leg with fat layer exposed L97.812 Non-pressure chronic ulcer of other part of right  lower leg with fat layer exposed S90.812D Abrasion, left foot, subsequent encounter Quantity: 1 Electronic Signature(s) Signed: 01/08/2021 3:44:17 PM By: Linton Ham MD Entered By: Linton Ham on 01/08/2021 09:08:35

## 2021-01-12 NOTE — Progress Notes (Signed)
QASIM, DIVELEY ([id number]) Visit Report for 01/08/2021 Arrival Information Details Patient Name: Date of Service: [place], [place] 01/08/2021 8:00 [place] Record Number: [id number] Patient Account Number: [id number] Date of Birth/Sex: Treating RN: Jan 19, 1941 (80 y.o. [name], Lauren Primary Care Onyx Edgley: [name]., [name] Other Clinician: Referring Rumaysa Sabatino: Treating Chani Ghanem/Extender: [name], [name] in Treatment: 3 Visit Information History Since Last Visit Added or deleted any medications: No Patient Arrived: Walker Any new allergies or adverse reactions: No Arrival Time: 07:45 Had a fall or experienced change in No Accompanied By: self activities of daily living that may affect Transfer Assistance: None risk of falls: Patient Identification Verified: Yes Signs or symptoms of abuse/neglect since last visito No Secondary Verification Process Completed: Yes Hospitalized since last visit: No Patient Requires Transmission-Based Precautions: No Implantable device outside of the clinic excluding No Patient Has Alerts: Yes cellular tissue based products placed in the center Patient Alerts: Patient on Blood Thinner since last visit: Has Dressing in Place as Prescribed: Yes Pain Present Now: No Electronic Signature(s) Signed: 01/08/2021 10:33:10 AM By: Sandre Kitty Entered By: Sandre Kitty on 01/08/2021 07:45:52 -------------------------------------------------------------------------------- Compression Therapy Details Patient Name: Date of Service: [place] [name] [place] 01/08/2021 8:00 A M Medical Record Number: [id number] Patient Account Number: [id number] Date of Birth/Sex: Treating RN: December 23, 1940 (80 y.o. Erie Noe Primary Care Madhuri Vacca: [name]., [name] Other Clinician: Referring Viren Lebeau: Treating Rydan Gulyas/Extender: [name], [name] in Treatment: 11 Compression Therapy Performed  for Wound Assessment: Wound #16R Right,Lateral Lower Leg Performed By: Clinician Rhae Hammock, RN Compression Type: Four Layer Post Procedure Diagnosis Same as Pre-procedure Electronic Signature(s) Signed: 01/12/2021 6:08:57 PM By: Rhae Hammock RN Entered By: Rhae Hammock on 01/08/2021 08:55:39 -------------------------------------------------------------------------------- Compression Therapy Details Patient Name: Date of Service: [place], [place] 01/08/2021 8:00 [place] Record Number: [id number] Patient Account Number: [id number] Date of Birth/Sex: Treating RN: 06-07-1941 (80 y.o. Erie Noe Primary Care Cristopher Ciccarelli: [name]., [name] Other Clinician: Referring Kaelie Henigan: Treating Tyerra Loretto/Extender: [name], [name] in Treatment: 87 Compression Therapy Performed for Wound Assessment: Wound #17 Right,Proximal,Lateral Lower Leg Performed By: Clinician Rhae Hammock, RN Compression Type: Four Layer Post Procedure Diagnosis Same as Pre-procedure Electronic Signature(s) Signed: 01/12/2021 6:08:57 PM By: Rhae Hammock RN Entered By: Rhae Hammock on 01/08/2021 08:55:39 -------------------------------------------------------------------------------- Compression Therapy Details Patient Name: Date of Service: [place], [place] 01/08/2021 8:00 [place] Record Number: [id number] Patient Account Number: [id number] Date of Birth/Sex: Treating RN: 05-Jul-1941 (80 y.o. Erie Noe Primary Care Rondell Pardon: [name]., [name] Other Clinician: Referring Socrates Cahoon: Treating Wilhemenia Camba/Extender: [name], [name] in Treatment: 95 Compression Therapy Performed for Wound Assessment: Wound #19 Left,Posterior Lower Leg Performed By: Clinician Rhae Hammock, RN Compression Type: Four Layer Post Procedure Diagnosis Same as Pre-procedure Electronic Signature(s) Signed: 01/12/2021 6:08:57  PM By: Rhae Hammock RN Entered By: Rhae Hammock on 01/08/2021 08:55:39 -------------------------------------------------------------------------------- Compression Therapy Details Patient Name: Date of Service: [place], [place] 01/08/2021 8:00 [place] Record Number: [id number] Patient Account Number: [id number] Date of Birth/Sex: Treating RN: 08/14/1941 (80 y.o. [name], Lauren Primary Care Ivette Castronova: [name]., [name] Other Clinician: Referring Lucrezia Dehne: Treating Lashundra Shiveley/Extender: [name], [name] in Treatment: 44 Compression Therapy Performed for Wound Assessment: Wound #20 Left,Lateral Lower Leg Performed  By: Aris Georgia, RN Compression Type: Four Layer Post Procedure Diagnosis Same as Pre-procedure Electronic Signature(s) Signed: 01/12/2021 6:08:57 PM By: Rhae Hammock RN Signed: 01/12/2021 6:08:57 PM By: Rhae Hammock RN Entered By: Rhae Hammock on 01/08/2021 08:55:39 -------------------------------------------------------------------------------- Encounter Discharge Information Details Patient Name: Date of Service: [place], [place] 01/08/2021 8:00 [place] Number: [id number] Patient Account Number: [id number] Date of Birth/Sex: Treating RN: 22-Oct-1940 (80 y.o. [name] Primary Care Emmerich Cryer: [name]., [name] Other Clinician: Referring Karysa Heft: Treating Javiel Canepa/Extender: [name], [name] in Treatment: 30 Encounter Discharge Information Items Discharge Condition: Stable Ambulatory Status: Walker Discharge Destination: Home Transportation: Other Accompanied By: self Schedule Follow-up Appointment: Yes Clinical Summary of Care: Patient Declined Notes transportation service Electronic Signature(s) Signed: 01/12/2021 4:50:33 PM By: Baruch Gouty RN, BSN Entered By: Baruch Gouty on 01/08/2021  09:35:31 -------------------------------------------------------------------------------- Lower Extremity Assessment Details Patient Name: Date of Service: [place], [place] 01/08/2021 8:00 A M Medical Record Number: [id number] Patient Account Number: [id number] Date of Birth/Sex: Treating RN: [birth year] (80 y.o. [name] Primary Care Akshath Mccarey: [name]., [name] Other Clinician: Referring Smiley Birr: Treating [place] Mckeough/Extender: [name], [name] in Treatment: 27 Edema Assessment Assessed: [Left: No] [Right: No] Edema: [Left: Yes] [Right: Yes] Calf Left: Right: Point of Measurement: 53 cm From Medial Instep 38.2 cm 35.8 cm Ankle Left: Right: Point of Measurement: 14 cm From Medial Instep 24 cm 23.8 cm Vascular Assessment Pulses: Dorsalis Pedis Palpable: [Left:No] [Right:No] Electronic Signature(s) Signed: 01/12/2021 4:50:33 PM By: Baruch Gouty RN, BSN Entered By: Baruch Gouty on 01/08/2021 08:20:10 -------------------------------------------------------------------------------- Multi Wound Chart Details Patient Name: Date of Service: [place] 01/08/2021 8:00 A M Medical Record Number: [id number] Patient Account Number: [id number] Date of Birth/Sex: Treating RN: August 14, 1941 (80 y.o. [name], Lauren Primary Care Katrinia Straker: [name]., [name] Other Clinician: Referring Kristine Chahal: Treating Avett Reineck/Extender: [name], [name] in Treatment: 30 Vital Signs Height(in): 71 Pulse(bpm): 39 Weight(lbs): 198 Blood Pressure(mmHg): 107/74 Body Mass Index(BMI): 28 Temperature(F): 98.1 Respiratory Rate(breaths/min): 20 Photos: [15:No Photos Left T Great oe] [16R:No Photos Right, Lateral Lower Leg] [17:No Photos Right, Proximal, Lateral Lower Leg] Wound Location: [15:Gradually Appeared] [16R:Gradually Appeared] [17:Blister] Wounding Event: [15:Infection - not elsewhere classified Venous Leg  Ulcer] [17:Venous Leg Ulcer] Primary Etiology: [15:N/A] [16R:N/A] [17:Lymphedema] Secondary Etiology: [15:Anemia, Congestive Heart Failure,] [16R:Anemia, Congestive Heart Failure,] [17:Anemia, Congestive Heart Failure,] Comorbid History: [15:Hypertension, Peripheral Venous Disease, End Stage Renal Disease Disease, End Stage Renal Disease Disease, End Stage Renal Disease 10/09/2020] [16R:Hypertension, Peripheral Venous 11/13/2020] [17:Hypertension, Peripheral Venous  12/04/2020] Date Acquired: [15:13] [16R:8] [17:4] Weeks of Treatment: [15:Open] [16R:Open] [17:Open] Wound Status: [15:No] [16R:Yes] [17:No] Wound Recurrence: [15:0.7x1x0.1] [16R:1.5x1.3x0.1] [17:0.9x0.5x0.1] Measurements L x W x D (cm) [15:0.55] [16R:1.532] [17:0.353] A (cm) : rea [15:0.055] [16R:0.153] [17:0.035] Volume (cm) : [15:65.00%] [16R:-39.30%] [17:-12.40%] % Reduction in Area: [15:65.00%] [16R:-39.10%] [17:-12.90%] % Reduction in Volume: [15:Full Thickness Without Exposed] [16R:Full Thickness Without Exposed] [17:Full Thickness Without Exposed] Classification: [15:Support Structures Small] [16R:Support Structures Medium] [17:Support Structures Medium] Exudate Amount: [15:Serous] [16R:Serous] [17:Serous] Exudate Type: [15:amber] [16R:amber] [17:amber] Exudate Color: [15:Flat and Intact] [16R:Flat and Intact] [17:Flat and Intact] Wound Margin: [15:Medium (34-66%)] [16R:Medium (34-66%)] [17:Large (67-100%)] Granulation Amount: [15:Pink] [16R:Pink] [17:Pink] Granulation Quality: [15:Medium (34-66%)] [16R:Medium (34-66%)] [17:Small (1-33%)] Necrotic Amount: [15:Fat Layer (Subcutaneous Tissue): Yes Fat Layer (Subcutaneous Tissue): Yes Fat Layer (Subcutaneous Tissue): Yes] Exposed Structures: [15:Fascia: No  Tendon: No Muscle: No Joint: No Bone: No Small (1-33%)] [16R:Fascia: No Tendon: No Muscle: No Joint: No Bone: No Medium (34-66%)] [17:Fascia: No Tendon: No Muscle: No Joint: No Bone: No Medium (34-66%)] Epithelialization:  [15:N/A] [16R:Compression Therapy] [17:Compression Therapy] Wound Number: [id number] 20 N/A Photos: No Photos No Photos N/A Left, Posterior Lower Leg Left, Lateral Lower Leg N/A Wound Location: Blister Blister N/A Wounding Event: Venous Leg Ulcer Venous Leg Ulcer N/A Primary Etiology: Lymphedema N/A N/A Secondary Etiology: Anemia, Congestive Heart Failure, Anemia, Congestive Heart Failure, N/A Comorbid History: Hypertension, Peripheral Venous Hypertension, Peripheral Venous Disease, End Stage Renal Disease Disease, End Stage Renal Disease 12/04/2020 01/08/2021 N/A Date Acquired: 4 0 N/A Weeks of Treatment: Open Open N/A Wound Status: No No N/A Wound Recurrence: 8.5x10x0.1 2.3x4.8x0.1 N/A Measurements L x W x D (cm) 66.759 8.671 N/A A (cm) : rea 6.676 0.867 N/A Volume (cm) : -1259.90% 0.00% N/A % Reduction in Area: -1259.70% 0.00% N/A % Reduction in Volume: Full Thickness Without Exposed Full Thickness Without Exposed N/A Classification: Support Structures Support Structures Large Medium N/A Exudate Amount: Serous Serous N/A Exudate Type: Physiological scientist N/A Exudate Color: Flat and Intact Flat and Intact N/A Wound Margin: Medium (34-66%) Large (67-100%) N/A Granulation Amount: Red, Pink Pink N/A Granulation Quality: None Present (0%) Small (1-33%) N/A Necrotic Amount: Fat Layer (Subcutaneous Tissue): Yes Fat Layer (Subcutaneous Tissue): Yes N/A Exposed Structures: Fascia: No Fascia: No Tendon: No Tendon: No Muscle: No Muscle: No Joint: No Joint: No Bone: No Bone: No Medium (34-66%) Medium (34-66%) N/A Epithelialization: Compression Therapy Compression Therapy N/A Procedures Performed: Treatment Notes Electronic Signature(s) Signed: 01/08/2021 3:44:17 PM By: Linton Ham MD Signed: 01/12/2021 6:08:57 PM By: Rhae Hammock RN Entered By: Linton Ham on 01/08/2021  09:04:31 -------------------------------------------------------------------------------- Multi-Disciplinary Care Plan Details Patient Name: Date of Service: [place], [place] 01/08/2021 8:00 A M Medical Record Number: [id number] Patient Account Number: [id number] Date of Birth/Sex: Treating RN: February 06, 1941 (80 y.o. Erie Noe Primary Care Akia Desroches: [name]., [name] Other Clinician: Referring Aurelio Mccamy: Treating Pawan Knechtel/Extender: [name], [name] in Treatment: 43 Multidisciplinary Care Plan reviewed with physician Active Inactive Venous Leg Ulcer Nursing Diagnoses: Knowledge deficit related to disease process and management Potential for venous Insuffiency (use before diagnosis confirmed) Goals: Patient will maintain optimal edema control Date Initiated: 05/01/2020 Target Resolution Date: 02/27/2021 Goal Status: Active Interventions: Assess peripheral edema status every visit. Compression as ordered Provide education on venous insufficiency Notes: Electronic Signature(s) Signed: 01/12/2021 6:08:57 PM By: Rhae Hammock RN Entered By: Rhae Hammock on 01/08/2021 08:53:50 -------------------------------------------------------------------------------- Pain Assessment Details Patient Name: Date of Service: [place] [name] [place] 01/08/2021 8:00 [place] Record Number: [id number] Patient Account Number: [id number] Date of Birth/Sex: Treating RN: 1941-07-07 (80 y.o. Erie Noe Primary Care Jessilynn Taft: [name]., [name] Other Clinician: Referring Alexx Mcburney: Treating Sandria Mcenroe/Extender: [name], [name] in Treatment: 20 Active Problems Location of Pain Severity and Description of Pain Patient Has Paino No Site Locations Pain Management and Medication Current Pain Management: Electronic Signature(s) Signed: 01/08/2021 10:33:10 AM By: Sandre Kitty Signed: 01/12/2021 6:08:57 PM By: Rhae Hammock RN Entered By: Sandre Kitty on 01/08/2021 07:51:53 -------------------------------------------------------------------------------- Patient/Caregiver Education Details Patient Name: Date of Service: [place] 5/12/2022andnbsp8:00 [place] Record Number: [id number] Patient Account Number: [id number] Date of Birth/Gender: Treating RN: 08/27/1941 (80 y.o. [name], Lauren Primary Care Physician: [name]., [name] Other  Clinician: Referring Physician: Treating Physician/Extender: [name], [name] in Treatment: 73 Education Assessment Education Provided To: Patient Education Topics Provided Venous: Methods: Explain/Verbal Responses: State content correctly Electronic Signature(s) Signed: 01/12/2021 6:08:57 PM By: Rhae Hammock RN Entered By: Rhae Hammock on 01/08/2021 08:54:01 -------------------------------------------------------------------------------- Wound Assessment Details Patient Name: Date of Service: [place], [place] 01/08/2021 8:00 [place] Record Number: [id number] Patient Account Number: [id number] Date of Birth/Sex: Treating RN: [birth year] (80 y.o. [name], Lauren Primary Care Bond Grieshop: [name]., [name] Other Clinician: Referring Jevaun Strick: Treating Raegan Winders/Extender: [name], [name] in Treatment: 18 Wound Status Wound Number: 15 Primary Infection - not elsewhere classified Etiology: Wound Location: Left T Great oe Wound Open Wounding Event: Gradually Appeared Status: Date Acquired: 10/09/2020 Comorbid Anemia, Congestive Heart Failure, Hypertension, Peripheral Weeks Of Treatment: 13 History: Venous Disease, End Stage Renal Disease Clustered Wound: No Photos Wound Measurements Length: (cm) 0.7 Width: (cm) 1 Depth: (cm) 0.1 Area: (cm) 0.55 Volume: (cm) 0.055 % Reduction in Area: 65% % Reduction in Volume: 65% Epithelialization: Small  (1-33%) Tunneling: No Undermining: No Wound Description Classification: Full Thickness Without Exposed Support Structures Wound Margin: Flat and Intact Exudate Amount: Small Exudate Type: Serous Exudate Color: amber Foul Odor After Cleansing: No Slough/Fibrino No Wound Bed Granulation Amount: Medium (34-66%) Exposed Structure Granulation Quality: Pink Fascia Exposed: No Necrotic Amount: Medium (34-66%) Fat Layer (Subcutaneous Tissue) Exposed: Yes Necrotic Quality: Adherent Slough Tendon Exposed: No Muscle Exposed: No Joint Exposed: No Bone Exposed: No Treatment Notes Wound #15 (Toe Great) Wound Laterality: Left Cleanser Soap and Water Discharge Instruction: May shower and wash wound with dial antibacterial soap and water prior to dressing change. Peri-Wound Care Topical Mupirocin Ointment Discharge Instruction: ***In Clinic only***. Apply Mupirocin (Bactroban) under Alignate Ag. Polysporin Discharge Instruction: patient to purchase and home health to apply under the alginate Ag, Primary Dressing KerraCel Ag Gelling Fiber Dressing, 2x2 in (silver alginate) Discharge Instruction: Apply silver alginate to wound bed as instructed Secondary Dressing Woven Gauze Sponges 2x2 in Discharge Instruction: Apply over primary dressing as directed. Secured With Conforming Stretch Gauze Bandage, Sterile 2x75 (in/in) Discharge Instruction: Secure with stretch gauze as directed. Compression Wrap Compression Stockings Add-Ons Electronic Signature(s) Signed: 01/08/2021 3:37:13 PM By: Sandre Kitty Signed: 01/12/2021 6:08:57 PM By: Rhae Hammock RN Entered By: Sandre Kitty on 01/08/2021 15:23:53 -------------------------------------------------------------------------------- Wound Assessment Details Patient Name: Date of Service: [place] 01/08/2021 8:00 [place] Record Number: [id number] Patient Account Number: [id number] Date of Birth/Sex: Treating  RN: 15-Apr-1941 (80 y.o. [name], Lauren Primary Care Zeffie Bickert: [name]., [name] Other Clinician: Referring Aleatha Taite: Treating Jaelee Laughter/Extender: [name], [name] in Treatment: 28 Wound Status Wound Number: 16R Primary Venous Leg Ulcer Etiology: Wound Location: Right, Lateral Lower Leg Wound Open Wounding Event: Gradually Appeared Status: Date Acquired: 11/13/2020 Comorbid Anemia, Congestive Heart Failure, Hypertension, Peripheral Weeks Of Treatment: 8 History: Venous Disease, End Stage Renal Disease Clustered Wound: No Photos Wound Measurements Length: (cm) 1.5 Width: (cm) 1.3 Depth: (cm) 0.1 Area: (cm) 1.532 Volume: (cm) 0.153 % Reduction in Area: -39.3% % Reduction in Volume: -39.1% Epithelialization: Medium (34-66%) Tunneling: No Undermining: No Wound Description Classification: Full Thickness Without Exposed Support Structures Wound Margin: Flat and Intact Exudate Amount: Medium Exudate Type: Serous Exudate Color: amber Foul Odor After Cleansing: No Slough/Fibrino No Wound Bed Granulation Amount: Medium (34-66%) Exposed Structure Granulation Quality: Pink Fascia Exposed: No  Necrotic Amount: Medium (34-66%) Fat Layer (Subcutaneous Tissue) Exposed: Yes Necrotic Quality: Adherent Slough Tendon Exposed: No Muscle Exposed: No Joint Exposed: No Bone Exposed: No Treatment Notes Wound #16R (Lower Leg) Wound Laterality: Right, Lateral Cleanser Wound Cleanser Discharge Instruction: Cleanse the wound with wound cleanser prior to applying a clean dressing using gauze sponges, not tissue or cotton balls. Soap and Water Discharge Instruction: May shower and wash wound with dial antibacterial soap and water prior to dressing change. Peri-Wound Care Sween Lotion (Moisturizing lotion) Discharge Instruction: Apply moisturizing lotion as directed Topical Primary Dressing KerraCel Ag Gelling Fiber Dressing, 4x5 in (silver  alginate) Discharge Instruction: Apply silver alginate to wound bed as instructed Secondary Dressing Woven Gauze Sponge, Non-Sterile 4x4 in Discharge Instruction: Apply over primary dressing as directed. ABD Pad, 5x9 Discharge Instruction: Apply over primary dressing as directed. Secured With Compression Wrap FourPress (4 layer compression wrap) Discharge Instruction: Apply four layer compression as directed. Compression Stockings Add-Ons Electronic Signature(s) Signed: 01/08/2021 3:37:13 PM By: Sandre Kitty Signed: 01/12/2021 6:08:57 PM By: Rhae Hammock RN Entered By: Sandre Kitty on 01/08/2021 15:24:27 -------------------------------------------------------------------------------- Wound Assessment Details Patient Name: Date of Service: [place] 01/08/2021 8:00 [place] Record Number: [id number] Patient Account Number: [id number] Date of Birth/Sex: Treating RN: 1940/09/09 (80 y.o. [name], Lauren Primary Care Rajveer Handler: Other Clinician: [name]., [name] Referring Adrain Nesbit: Treating Navayah Sok/Extender: [name], [name] in Treatment: 45 Wound Status Wound Number: 17 Primary Venous Leg Ulcer Etiology: Wound Location: Right, Proximal, Lateral Lower Leg Secondary Lymphedema Wounding Event: Blister Etiology: Date Acquired: 12/04/2020 Wound Open Weeks Of Treatment: 4 Status: Clustered Wound: No Comorbid Anemia, Congestive Heart Failure, Hypertension, Peripheral History: Venous Disease, End Stage Renal Disease Wound Measurements Length: (cm) 0.9 Width: (cm) 0.5 Depth: (cm) 0.1 Area: (cm) 0.353 Volume: (cm) 0.035 % Reduction in Area: -12.4% % Reduction in Volume: -12.9% Epithelialization: Medium (34-66%) Tunneling: No Undermining: No Wound Description Classification: Full Thickness Without Exposed Support Structures Wound Margin: Flat and Intact Exudate Amount: Medium Exudate Type: Serous Exudate Color:  amber Foul Odor After Cleansing: No Slough/Fibrino No Wound Bed Granulation Amount: Large (67-100%) Exposed Structure Granulation Quality: Pink Fascia Exposed: No Necrotic Amount: Small (1-33%) Fat Layer (Subcutaneous Tissue) Exposed: Yes Necrotic Quality: Adherent Slough Tendon Exposed: No Muscle Exposed: No Joint Exposed: No Bone Exposed: No Treatment Notes Wound #17 (Lower Leg) Wound Laterality: Right, Lateral, Proximal Cleanser Wound Cleanser Discharge Instruction: Cleanse the wound with wound cleanser prior to applying a clean dressing using gauze sponges, not tissue or cotton balls. Soap and Water Discharge Instruction: May shower and wash wound with dial antibacterial soap and water prior to dressing change. Peri-Wound Care Sween Lotion (Moisturizing lotion) Discharge Instruction: Apply moisturizing lotion as directed Topical Primary Dressing KerraCel Ag Gelling Fiber Dressing, 4x5 in (silver alginate) Discharge Instruction: Apply silver alginate to wound bed as instructed Secondary Dressing Woven Gauze Sponge, Non-Sterile 4x4 in Discharge Instruction: Apply over primary dressing as directed. ABD Pad, 5x9 Discharge Instruction: Apply over primary dressing as directed. Secured With Compression Wrap FourPress (4 layer compression wrap) Discharge Instruction: Apply four layer compression as directed. Compression Stockings Add-Ons Electronic Signature(s) Signed: 01/12/2021 4:50:33 PM By: Baruch Gouty RN, BSN Signed: 01/12/2021 6:08:57 PM By: Rhae Hammock RN Entered By: Baruch Gouty on 01/08/2021 08:22:15 -------------------------------------------------------------------------------- Wound Assessment Details Patient Name: Date of Service: [place] [employer] [place] [facility] 01/08/2021 8:00 A M Medical Record Number: [id number] Patient Account Number: [id number] Date of Birth/Sex:  Treating RN: 10/13/1940 (80 y.o. [name], Lauren Primary Care Rossanna Spitzley: [name].,  [name] Other Clinician: Referring Gracin Soohoo: Treating Ruven Corradi/Extender: [name], [name] in Treatment: 70 Wound Status Wound Number: 19 Primary Venous Leg Ulcer Etiology: Wound Location: Left, Posterior Lower Leg Secondary Lymphedema Wounding Event: Blister Etiology: Date Acquired: 12/04/2020 Wound Open Weeks Of Treatment: 4 Status: Clustered Wound: No Comorbid Anemia, Congestive Heart Failure, Hypertension, Peripheral History: Venous Disease, End Stage Renal Disease Photos Wound Measurements Length: (cm) 8.5 Width: (cm) 10 Depth: (cm) 0.1 Area: (cm) 66.759 Volume: (cm) 6.676 % Reduction in Area: -1259.9% % Reduction in Volume: -1259.7% Epithelialization: Medium (34-66%) Tunneling: No Undermining: No Wound Description Classification: Full Thickness Without Exposed Support Structures Wound Margin: Flat and Intact Exudate Amount: Large Exudate Type: Serous Exudate Color: amber Foul Odor After Cleansing: No Slough/Fibrino No Wound Bed Granulation Amount: Medium (34-66%) Exposed Structure Granulation Quality: Red, Pink Fascia Exposed: No Necrotic Amount: None Present (0%) Fat Layer (Subcutaneous Tissue) Exposed: Yes Tendon Exposed: No Muscle Exposed: No Joint Exposed: No Bone Exposed: No Treatment Notes Wound #19 (Lower Leg) Wound Laterality: Left, Posterior Cleanser Wound Cleanser Discharge Instruction: Cleanse the wound with wound cleanser prior to applying a clean dressing using gauze sponges, not tissue or cotton balls. Soap and Water Discharge Instruction: May shower and wash wound with dial antibacterial soap and water prior to dressing change. Peri-Wound Care Sween Lotion (Moisturizing lotion) Discharge Instruction: Apply moisturizing lotion as directed Zinc Oxide Ointment 30g tube Discharge Instruction: Apply Zinc Oxide to periwound with each dressing change Topical Primary Dressing KerraCel Ag Gelling Fiber Dressing, 4x5  in (silver alginate) Discharge Instruction: Apply silver alginate to wound bed as instructed Secondary Dressing Woven Gauze Sponge, Non-Sterile 4x4 in Discharge Instruction: Apply over primary dressing as directed. ABD Pad, 5x9 Discharge Instruction: Apply over primary dressing as directed. Secured With Compression Wrap FourPress (4 layer compression wrap) Discharge Instruction: Apply four layer compression as directed. Compression Stockings Add-Ons Electronic Signature(s) Signed: 01/08/2021 3:37:13 PM By: Sandre Kitty Signed: 01/12/2021 6:08:57 PM By: Rhae Hammock RN Entered By: Sandre Kitty on 01/08/2021 15:24:59 -------------------------------------------------------------------------------- Wound Assessment Details Patient Name: Date of Service: [place] 01/08/2021 8:00 [place] Record Number: [id number] Patient Account Number: [id number] Date of Birth/Sex: Treating RN: 06-27-1941 (80 y.o. [name], Lauren Primary Care Ayame Rena: [name]., [name] Other Clinician: Referring Butler Vegh: Treating Breland Trouten/Extender: [name], [name] in Treatment: 40 Wound Status Wound Number: 20 Primary Venous Leg Ulcer Etiology: Wound Location: Left, Lateral Lower Leg Wound Open Wounding Event: Blister Status: Date Acquired: 01/08/2021 Comorbid Anemia, Congestive Heart Failure, Hypertension, Peripheral Weeks Of Treatment: 0 History: Venous Disease, End Stage Renal Disease Clustered Wound: No Photos Wound Measurements Length: (cm) 2.3 Width: (cm) 4.8 Depth: (cm) 0.1 Area: (cm) 8.671 Volume: (cm) 0.867 % Reduction in Area: 0% % Reduction in Volume: 0% Epithelialization: Medium (34-66%) Tunneling: No Undermining: No Wound Description Classification: Full Thickness Without Exposed Support Structu Wound Margin: Flat and Intact Exudate Amount: Medium Exudate Type: Serous Exudate Color: amber res Foul Odor After Cleansing:  No Slough/Fibrino Yes Wound Bed Granulation Amount: Large (67-100%) Exposed Structure Granulation Quality: Pink Fascia Exposed: No Necrotic Amount: Small (1-33%) Fat Layer (Subcutaneous Tissue) Exposed: Yes Necrotic Quality: Adherent Slough Tendon Exposed: No Muscle Exposed: No Joint Exposed: No Bone Exposed: No Treatment Notes Wound #20 (Lower Leg) Wound Laterality: Left, Lateral Cleanser Peri-Wound Care Topical Primary Dressing Secondary Dressing Secured With  Compression Wrap Compression Stockings Add-Ons Electronic Signature(s) Signed: 01/08/2021 3:37:13 PM By: Sandre Kitty Signed: 01/12/2021 6:08:57 PM By: Rhae Hammock RN Entered By: Sandre Kitty on 01/08/2021 15:25:28 -------------------------------------------------------------------------------- Vitals Details Patient Name: Date of Service: [place] 01/08/2021 8:00 [place] Record Number: [id number] Patient Account Number: [id number] Date of Birth/Sex: Treating RN: 08/11/1941 (80 y.o. [name], Lauren Primary Care Tradarius Reinwald: [name]., [name] Other Clinician: Referring Nijae Doyel: Treating Haruko Mersch/Extender: [name], [name] in Treatment: 52 Vital Signs Time Taken: 07:48 Temperature (F): 98.1 Height (in): 71 Pulse (bpm): 91 Weight (lbs): 198 Respiratory Rate (breaths/min): 20 Body Mass Index (BMI): 27.6 Blood Pressure (mmHg): 107/74 Reference Range: 80 - 120 mg / dl Electronic Signature(s) Signed: 01/08/2021 10:33:10 AM By: Sandre Kitty Entered By: Sandre Kitty on 01/08/2021 07:51:48

## 2021-01-22 ENCOUNTER — Other Ambulatory Visit: Payer: Self-pay

## 2021-01-22 ENCOUNTER — Encounter (HOSPITAL_BASED_OUTPATIENT_CLINIC_OR_DEPARTMENT_OTHER): Payer: Medicare Other | Admitting: Internal Medicine

## 2021-01-22 NOTE — Progress Notes (Addendum)
KARO, ROG ([id number]) Visit Report for 01/22/2021 Arrival Information Details Patient Name: Date of Service: [place], [place] 01/22/2021 8:00 A M Medical Record Number: [id number] Patient Account Number: [id number] Date of Birth/Sex: Treating RN: Dec 19, 1940 (80 y.o. [name] Corinthian Mizrahi: [name]., [name] Other Clinician: Referring Rydan Gulyas: Treating Grethel Zenk/Extender: Mack Hook., [name] in Treatment: 11 Visit Information History Since Last Visit Added or deleted any medications: No Patient Arrived: Walker Any new allergies or adverse reactions: No Arrival Time: 07:47 Had a fall or experienced change in No Accompanied By: self activities of daily living that may affect Transfer Assistance: None risk of falls: Patient Identification Verified: Yes Signs or symptoms of abuse/neglect since last visito No Secondary Verification Process Completed: Yes Hospitalized since last visit: No Patient Requires Transmission-Based Precautions: No Implantable device outside of the clinic excluding No Patient Has Alerts: Yes cellular tissue based products placed in the center Patient Alerts: Patient on Blood Thinner since last visit: Has Dressing in Place as Prescribed: Yes Has Compression in Place as Prescribed: Yes Pain Present Now: No Notes patient c/o edematous scrotum. Electronic Signature(s) Signed: 01/22/2021 6:02:39 PM By: Deon Pilling Entered By: Deon Pilling on 01/22/2021 08:03:44 -------------------------------------------------------------------------------- Compression Therapy Details Patient Name: Date of Service: [place] [name] [facility] 01/22/2021 8:00 A M Medical Record Number: [id number] Patient Account Number: [id number] Date of Birth/Sex: Treating RN: [birth year] (80 y.o. [name] Arin Vanosdol: [name]., [name] Other Clinician: Referring Pedro Whiters: Treating Shantella Blubaugh/Extender: Mack Hook., [name] in Treatment: 25 Compression Therapy Performed for Wound Assessment: Wound #16R Right,Lateral Lower Leg Performed By: Clinician Baruch Gouty, RN Compression Type: Four Layer Post Procedure Diagnosis Same as Pre-procedure Electronic Signature(s) Signed: 01/22/2021 6:02:39 PM By: Deon Pilling Entered By: Deon Pilling on 01/22/2021 08:50:33 -------------------------------------------------------------------------------- Compression Therapy Details Patient Name: Date of Service: [place] [name] [facility] 01/22/2021 8:00 [place] Record Number: [id number] Patient Account Number: [id number] Date of Birth/Sex: Treating RN: Mar 26, 1941 (80 y.o. [name] Marce Schartz: [name]., [name] Other Clinician: Referring Cailee Blanke: Treating Ariba Lehnen/Extender: Mack Hook., [name] in Treatment: 30 Compression Therapy Performed for Wound Assessment: Wound #17 Right,Proximal,Lateral Lower Leg Performed By: Clinician Baruch Gouty, RN Compression Type: Four Layer Post Procedure Diagnosis Same as Pre-procedure Electronic Signature(s) Signed: 01/22/2021 6:02:39 PM By: Deon Pilling Entered By: Deon Pilling on 01/22/2021 08:50:33 -------------------------------------------------------------------------------- Compression Therapy Details Patient Name: Date of Service: [place], [place] 01/22/2021 8:00 [place] Record Number: [id number] Patient Account Number: [id number] Date of Birth/Sex: Treating RN: August 25, 1941 (80 y.o. [name] Trelon Plush: [name]., [name] Other Clinician: Referring Carsyn Taubman: Treating Zuriel Yeaman/Extender: Mack Hook., [name] in Treatment: 49 Compression Therapy Performed for Wound Assessment: Wound #19 Left,Posterior Lower Leg Performed By: Clinician Baruch Gouty, RN Compression Type: Four Layer Post Procedure Diagnosis Same as Pre-procedure Electronic  Signature(s) Signed: 01/22/2021 6:02:39 PM By: Deon Pilling Entered By: Deon Pilling on 01/22/2021 08:50:33 -------------------------------------------------------------------------------- Compression Therapy Details Patient Name: Date of Service: [place] [name] [facility] 01/22/2021 8:00 [place] Record Number: [id number] Patient Account Number: [id number] Date of Birth/Sex: Treating RN: 1940-09-01 (80 y.o. [name], [name] Primary Care Modestine Scherzinger: [name]., [name] Other Clinician: Referring Nasira Janusz: Treating Bayli Quesinberry/Extender: Mack Hook., [name] in Treatment: 33 Compression Therapy Performed  for Wound Assessment: Wound #20 Left,Lateral Lower Leg Performed By: Clinician Baruch Gouty, RN Compression Type: Four Layer Post Procedure Diagnosis Same as Pre-procedure Electronic Signature(s) Signed: 01/22/2021 6:02:39 PM By: Deon Pilling Entered By: Deon Pilling on 01/22/2021 08:50:33 -------------------------------------------------------------------------------- Compression Therapy Details Patient Name: Date of Service: [place] [name] [facility] 01/22/2021 8:00 A M Medical Record Number: [id number] Patient Account Number: [id number] Date of Birth/Sex: Treating RN: 1941/03/07 (80 y.o. [name] Jerrard Bradburn: [name]., [name] Other Clinician: Referring Ramy Greth: Treating Kemi Gell/Extender: Mack Hook., [name] in Treatment: 41 Compression Therapy Performed for Wound Assessment: Wound #21 Left,Anterior Lower Leg Performed By: Clinician Baruch Gouty, RN Compression Type: Four Layer Post Procedure Diagnosis Same as Pre-procedure Electronic Signature(s) Signed: 01/22/2021 6:02:39 PM By: Deon Pilling Entered By: Deon Pilling on 01/22/2021 08:50:34 -------------------------------------------------------------------------------- Compression Therapy Details Patient Name: Date of Service: [place] [name] [facility]  01/22/2021 8:00 [place] Record Number: [id number] Patient Account Number: [id number] Date of Birth/Sex: Treating RN: 20-Feb-1941 (80 y.o. [name] Najai Waszak: [name]., [name] Other Clinician: Referring Markice Torbert: Treating Molly Savarino/Extender: Mack Hook., [name] in Treatment: 69 Compression Therapy Performed for Wound Assessment: Wound #22 Left,Proximal,Anterior Lower Leg Performed By: Clinician Baruch Gouty, RN Compression Type: Four Layer Post Procedure Diagnosis Same as Pre-procedure Electronic Signature(s) Signed: 01/22/2021 6:02:39 PM By: Deon Pilling Entered By: Deon Pilling on 01/22/2021 08:50:34 -------------------------------------------------------------------------------- Encounter Discharge Information Details Patient Name: Date of Service: [place], [place] 01/22/2021 8:00 [place] Record Number: [id number] Patient Account Number: [id number] Date of Birth/Sex: Treating RN: Apr 27, 1941 (80 y.o. [name] Primary Care Ashya Nicolaisen: [name]., [name] Other Clinician: Referring Heddy Vidana: Treating Kortni Hasten/Extender: Mack Hook., [name] in Treatment: 40 Encounter Discharge Information Items Discharge Condition: Stable Ambulatory Status: Walker Discharge Destination: Home Transportation: Other Accompanied By: self Schedule Follow-up Appointment: Yes Clinical Summary of Care: Patient Declined Notes SCAT Electronic Signature(s) Signed: 01/22/2021 5:45:23 PM By: Baruch Gouty RN, BSN Entered By: Baruch Gouty on 01/22/2021 09:39:57 -------------------------------------------------------------------------------- Lower Extremity Assessment Details Patient Name: Date of Service: [place] 01/22/2021 8:00 A M Medical Record Number: [id number] Patient Account Number: [id number] Date of Birth/Sex: Treating RN: 15-Apr-1941 (80 y.o. [name], [name] Primary Care Lillianna Sabel:  [name]., [name] Other Clinician: Referring Angelette Ganus: Treating Emmery Seiler/Extender: Mack Hook., [name] in Treatment: 89 Edema Assessment Assessed: [Left: Yes] [Right: Yes] Edema: [Left: Yes] [Right: Yes] Calf Left: Right: Point of Measurement: 53 cm From Medial Instep 38 cm 37 cm Ankle Left: Right: Point of Measurement: 14 cm From Medial Instep 23 cm 23 cm Electronic Signature(s) Signed: 01/22/2021 6:02:39 PM By: Deon Pilling Entered By: Deon Pilling on 01/22/2021 08:04:04 -------------------------------------------------------------------------------- Multi Wound Chart Details Patient Name: Date of Service: [place], [place] 01/22/2021 8:00 A M Medical Record Number: [id number] Patient Account Number: [id number] Date of Birth/Sex: Treating RN: Aug 30, 1941 (80 y.o. [name] Dazia Lippold: [name]., [name] Other Clinician: Referring Francesca Strome: Treating Lyana Asbill/Extender: Mack Hook., [name] in Treatment: 52 Vital Signs Height(in): 71 Pulse(bpm): 95 Weight(lbs): 198 Blood Pressure(mmHg): 111/65 Body Mass Index(BMI): 28 Temperature(F): 97.7 Respiratory Rate(breaths/min): 18 Photos: [15:No Photos Left T Great oe] [16R:No Photos Right, Lateral Lower Leg] [17:No Photos Right, Proximal, Lateral Lower Leg] Wound Location: [15:Gradually Appeared] [16R:Gradually Appeared] [17:Blister] Wounding  Event: [15:Infection - not elsewhere classified Venous Leg Ulcer] [17:Venous Leg Ulcer] Primary Etiology: [15:N/A] [16R:N/A] [17:Lymphedema] Secondary Etiology: [15:Anemia, Congestive Heart Failure,] [16R:Anemia, Congestive Heart Failure,] [17:Anemia, Congestive Heart Failure,] Comorbid History: [15:Hypertension, Peripheral Venous Disease, End Stage Renal Disease Disease, End Stage Renal Disease Disease, End Stage Renal Disease 10/09/2020] [16R:Hypertension, Peripheral Venous 11/13/2020] [17:Hypertension, Peripheral  Venous  12/04/2020] Date Acquired: [15:15] [16R:10] [17:6] Weeks of Treatment: [15:Open] [16R:Open] [17:Open] Wound Status: [15:No] [16R:Yes] [17:No] Wound Recurrence: [15:1x2.4x0.1] [16R:1.3x0.9x0.1] [17:1.5x0.6x0.1] Measurements L x W x D (cm) [15:1.885] [16R:0.919] [17:0.707] A (cm) : rea [15:0.188] [16R:0.092] [17:0.071] Volume (cm) : [15:-20.00%] [16R:16.50%] [17:-125.20%] % Reduction in Area: [15:-19.70%] [16R:16.40%] [17:-129.00%] % Reduction in Volume: [15:Full Thickness Without Exposed] [16R:Full Thickness Without Exposed] [17:Full Thickness Without Exposed] Classification: [15:Support Structures Medium] [16R:Support Structures Medium] [17:Support Structures Medium] Exudate Amount: [15:Serous] [16R:Serous] [17:Serous] Exudate Type: [15:amber] [16R:amber] [17:amber] Exudate Color: [15:Flat and Intact] [16R:Flat and Intact] [17:Flat and Intact] Wound Margin: [15:Large (67-100%)] [16R:Large (67-100%)] [17:Large (67-100%)] Granulation Amount: [15:Pink] [16R:Pink] [17:Pink] Granulation Quality: [15:Small (1-33%)] [16R:Small (1-33%)] [17:Small (1-33%)] Necrotic Amount: [15:Fat Layer (Subcutaneous Tissue): Yes Fat Layer (Subcutaneous Tissue): Yes Fat Layer (Subcutaneous Tissue): Yes] Exposed Structures: [15:Fascia: No Tendon: No Muscle: No Joint: No Bone: No Small (1-33%)] [16R:Fascia: No Tendon: No Muscle: No Joint: No Bone: No Medium (34-66%)] [17:Fascia: No Tendon: No Muscle: No Joint: No Bone: No Medium (34-66%)] Epithelialization: [15:N/A] [16R:Compression Therapy] [17:Compression Therapy] Wound Number: 19 20 21  Photos: No Photos No Photos No Photos Left, Posterior Lower Leg Left, Lateral Lower Leg Left, Anterior Lower Leg Wound Location: Blister Blister Gradually Appeared Wounding Event: Venous Leg Ulcer Venous Leg Ulcer Lymphedema Primary Etiology: Lymphedema N/A N/A Secondary Etiology: Anemia, Congestive Heart Failure, Anemia, Congestive Heart Failure, Anemia, Congestive  Heart Failure, Comorbid History: Hypertension, Peripheral Venous Hypertension, Peripheral Venous Hypertension, Peripheral Venous Disease, End Stage Renal Disease Disease, End Stage Renal Disease Disease, End Stage Renal Disease 12/04/2020 01/08/2021 01/22/2021 Date Acquired: 6 2 0 Weeks of Treatment: Open Open Open Wound Status: No No No Wound Recurrence: 6.5x6x0.1 1x1.4x0.1 2x1.8x0.1 Measurements L x W x D (cm) 30.631 1.1 2.827 A (cm) : rea 3.063 0.11 0.283 Volume (cm) : -524.00% 87.30% N/A % Reduction in Area: -523.80% 87.30% N/A % Reduction in Volume: Full Thickness Without Exposed Full Thickness Without Exposed Full Thickness Without Exposed Classification: Support Structures Support Structures Support Structures Large Medium Medium Exudate Amount: Serous Serous Serosanguineous Exudate Type: amber amber red, brown Exudate Color: Flat and Intact Flat and Intact Distinct, outline attached Wound Margin: Medium (34-66%) Large (67-100%) Large (67-100%) Granulation Amount: Red, Pink Pink Red, Pink Granulation Quality: Medium (34-66%) Small (1-33%) None Present (0%) Necrotic Amount: Fat Layer (Subcutaneous Tissue): Yes Fat Layer (Subcutaneous Tissue): Yes Fat Layer (Subcutaneous Tissue): Yes Exposed Structures: Fascia: No Fascia: No Fascia: No Tendon: No Tendon: No Tendon: No Muscle: No Muscle: No Muscle: No Joint: No Joint: No Joint: No Bone: No Bone: No Bone: No Medium (34-66%) Large (67-100%) Small (1-33%) Epithelialization: Compression Therapy Compression Therapy Compression Therapy Procedures Performed: Wound Number: 22 N/A N/A Photos: No Photos N/A N/A Left, Proximal, Anterior Lower Leg N/A N/A Wound Location: Gradually Appeared N/A N/A Wounding Event: Lymphedema N/A N/A Primary Etiology: N/A N/A N/A Secondary Etiology: Anemia, Congestive Heart Failure, N/A N/A Comorbid History: Hypertension, Peripheral Venous Disease, End Stage Renal  Disease 01/22/2021 N/A N/A Date Acquired: 0 N/A N/A Weeks of Treatment: Open N/A N/A Wound Status: No N/A N/A Wound Recurrence: 1.1x1.3x0.1 N/A N/A Measurements L x W x D (cm)  1.123 N/A N/A A (cm) : rea 0.112 N/A N/A Volume (cm) : N/A N/A N/A % Reduction in Area: N/A N/A N/A % Reduction in Volume: Full Thickness Without Exposed N/A N/A Classification: Support Structures Medium N/A N/A Exudate Amount: Serosanguineous N/A N/A Exudate Type: red, brown N/A N/A Exudate Color: Distinct, outline attached N/A N/A Wound Margin: Large (67-100%) N/A N/A Granulation Amount: Red N/A N/A Granulation Quality: None Present (0%) N/A N/A Necrotic Amount: Fat Layer (Subcutaneous Tissue): Yes N/A N/A Exposed Structures: Fascia: No Tendon: No Muscle: No Joint: No Bone: No Small (1-33%) N/A N/A Epithelialization: Compression Therapy N/A N/A Procedures Performed: Treatment Notes Electronic Signature(s) Signed: 01/22/2021 8:58:57 AM By: Kalman Shan DO Signed: 01/22/2021 6:02:39 PM By: Deon Pilling Entered By: Kalman Shan on 01/22/2021 08:54:58 -------------------------------------------------------------------------------- Multi-Disciplinary Care Plan Details Patient Name: Date of Service: [place] 01/22/2021 8:00 A M Medical Record Number: [id number] Patient Account Number: [id number] Date of Birth/Sex: Treating RN: 1941-04-21 (80 y.o. [name] Kylene Zamarron: [name]., [name] Other Clinician: Referring Mykira Hofmeister: Treating Uriah Trueba/Extender: Mack Hook., [name] in Treatment: 71 [place] reviewed with physician Active Inactive Electronic Signature(s) Signed: 03/17/2021 1:10:42 PM By: Deon Pilling Previous Signature: 01/22/2021 6:02:39 PM Version By: Deon Pilling Entered By: Deon Pilling on 03/17/2021  13:10:40 -------------------------------------------------------------------------------- Pain Assessment Details Patient Name: Date of Service: [place] [name] [facility] 01/22/2021 8:00 A M Medical Record Number: [id number] Patient Account Number: [id number] Date of Birth/Sex: Treating RN: Jul 21, 1941 (80 y.o. [name], [name] Primary Care Shaneese Tait: [name]., [name] Other Clinician: Referring Kaylla Cobos: Treating Mylie Mccurley/Extender: Mack Hook., [name] in Treatment: 80 Active Problems Location of Pain Severity and Description of Pain Patient Has Paino No Site Locations Rate the pain. Current Pain Level: 0 Pain Management and Medication Current Pain Management: Medication: No Cold Application: No Rest: No Massage: No Activity: No T.E.N.S.: No Heat Application: No Leg drop or elevation: No Is the Current Pain Management Adequate: Adequate How does your wound impact your activities of daily livingo Sleep: No Bathing: No Appetite: No Relationship With Others: No Bladder Continence: No Emotions: No Bowel Continence: No Work: No Toileting: No Drive: No Dressing: No Hobbies: No Electronic Signature(s) Signed: 01/22/2021 6:02:39 PM By: Deon Pilling Entered By: Deon Pilling on 01/22/2021 08:03:38 -------------------------------------------------------------------------------- Patient/Caregiver Education Details Patient Name: Date of Service: [place], [place] 5/26/2022andnbsp8:00 A M Medical Record Number: [id number] Patient Account Number: [id number] Date of Birth/Gender: Treating RN: 12-12-1940 (80 y.o. [name] Physician: [name]., [name] Other Clinician: Referring Physician: Treating Physician/Extender: Mack Hook., [name] in Treatment: 81 Education Assessment Education Provided To: Patient Education Topics Provided Venous: Handouts: Managing Venous Disease and Related  Ulcers Methods: Explain/Verbal Responses: Reinforcements needed Electronic Signature(s) Signed: 01/22/2021 6:02:39 PM By: Deon Pilling Entered By: Deon Pilling on 01/22/2021 08:22:49 -------------------------------------------------------------------------------- Wound Assessment Details Patient Name: Date of Service: [place] [name] [place] 01/22/2021 8:00 A M Medical Record Number: [id number] Patient Account Number: [id number] Date of Birth/Sex: Treating RN: [birth year] (80 y.o. [name], [name] Primary Care Torrey Ballinas: [name]., [name] Other Clinician: Referring Kaileena Obi: Treating Keir Foland/Extender: Mack Hook., [name] in Treatment: 54 Wound Status Wound Number: 15 Primary Infection - not elsewhere classified Etiology: Wound Location: Left T Great oe Wound Open Wounding Event: Gradually Appeared Status: Date Acquired: 10/09/2020 Comorbid Anemia,  Congestive Heart Failure, Hypertension, Peripheral Weeks Of Treatment: 15 History: Venous Disease, End Stage Renal Disease Clustered Wound: No Photos Wound Measurements Length: (cm) 1 Width: (cm) 2.4 Depth: (cm) 0.1 Area: (cm) 1.885 Volume: (cm) 0.188 % Reduction in Area: -20% % Reduction in Volume: -19.7% Epithelialization: Small (1-33%) Tunneling: No Undermining: No Wound Description Classification: Full Thickness Without Exposed Support Structures Wound Margin: Flat and Intact Exudate Amount: Medium Exudate Type: Serous Exudate Color: amber Foul Odor After Cleansing: No Slough/Fibrino No Wound Bed Granulation Amount: Large (67-100%) Exposed Structure Granulation Quality: Pink Fascia Exposed: No Necrotic Amount: Small (1-33%) Fat Layer (Subcutaneous Tissue) Exposed: Yes Necrotic Quality: Adherent Slough Tendon Exposed: No Muscle Exposed: No Joint Exposed: No Bone Exposed: No Electronic Signature(s) Signed: 01/22/2021 4:57:49 PM By: Sandre Kitty Signed: 01/22/2021 6:02:39 PM By:  Deon Pilling Entered By: Sandre Kitty on 01/22/2021 16:47:41 -------------------------------------------------------------------------------- Wound Assessment Details Patient Name: Date of Service: [place], [place] 01/22/2021 8:00 A M Medical Record Number: [id number] Patient Account Number: [id number] Date of Birth/Sex: Treating RN: 1940-10-10 (80 y.o. [name], [name] Primary Care Ajooni Karam: [name]., [name] Other Clinician: Referring Maryssa Giampietro: Treating Terricka Onofrio/Extender: Mack Hook., [name] in Treatment: 2 Wound Status Wound Number: [id number] Primary Venous Leg Ulcer Etiology: Wound Location: Right, Lateral Lower Leg Wound Open Wounding Event: Gradually Appeared Status: Date Acquired: 11/13/2020 Comorbid Anemia, Congestive Heart Failure, Hypertension, Peripheral Weeks Of Treatment: 10 History: Venous Disease, End Stage Renal Disease Clustered Wound: No Photos Wound Measurements Length: (cm) 1.3 Width: (cm) 0.9 Depth: (cm) 0.1 Area: (cm) 0.919 Volume: (cm) 0.092 % Reduction in Area: 16.5% % Reduction in Volume: 16.4% Epithelialization: Medium (34-66%) Tunneling: No Undermining: No Wound Description Classification: Full Thickness Without Exposed Support Structures Wound Margin: Flat and Intact Exudate Amount: Medium Exudate Type: Serous Exudate Color: amber Foul Odor After Cleansing: No Slough/Fibrino No Wound Bed Granulation Amount: Large (67-100%) Exposed Structure Granulation Quality: Pink Fascia Exposed: No Necrotic Amount: Small (1-33%) Fat Layer (Subcutaneous Tissue) Exposed: Yes Necrotic Quality: Adherent Slough Tendon Exposed: No Muscle Exposed: No Joint Exposed: No Bone Exposed: No Electronic Signature(s) Signed: 01/22/2021 4:57:49 PM By: Sandre Kitty Signed: 01/22/2021 6:02:39 PM By: Deon Pilling Entered By: Sandre Kitty on 01/22/2021  16:48:07 -------------------------------------------------------------------------------- Wound Assessment Details Patient Name: Date of Service: [place] 01/22/2021 8:00 A M Medical Record Number: [id number] Patient Account Number: [id number] Date of Birth/Sex: Treating RN: 1940/12/17 (80 y.o. [name], [name] Primary Care Shakima Nisley: [name]., [name] Other Clinician: Referring Miley Lindon: Treating Jamonica Schoff/Extender: Mack Hook., [name] in Treatment: 21 Wound Status Wound Number: 17 Primary Venous Leg Ulcer Etiology: Wound Location: Right, Proximal, Lateral Lower Leg Secondary Lymphedema Wounding Event: Blister Etiology: Date Acquired: 12/04/2020 Wound Open Weeks Of Treatment: 6 Status: Clustered Wound: No Comorbid Anemia, Congestive Heart Failure, Hypertension, Peripheral History: Venous Disease, End Stage Renal Disease Photos Wound Measurements Length: (cm) 1.5 Width: (cm) 0.6 Depth: (cm) 0.1 Area: (cm) 0.707 Volume: (cm) 0.071 % Reduction in Area: -125.2% % Reduction in Volume: -129% Epithelialization: Medium (34-66%) Tunneling: No Undermining: No Wound Description Classification: Full Thickness Without Exposed Support Structures Wound Margin: Flat and Intact Exudate Amount: Medium Exudate Type: Serous Exudate Color: amber Foul Odor After Cleansing: No Slough/Fibrino No Wound Bed Granulation Amount: Large (67-100%) Exposed Structure Granulation Quality: Pink Fascia Exposed: No Necrotic Amount: Small (1-33%) Fat Layer (Subcutaneous Tissue) Exposed: Yes Necrotic Quality: Adherent Slough Tendon Exposed: No Muscle Exposed: No Joint  Exposed: No Bone Exposed: No Electronic Signature(s) Signed: 01/22/2021 4:57:49 PM By: Sandre Kitty Signed: 01/22/2021 6:02:39 PM By: Deon Pilling Entered By: Sandre Kitty on 01/22/2021 16:48:33 -------------------------------------------------------------------------------- Wound  Assessment Details Patient Name: Date of Service: [place] 01/22/2021 8:00 [place] Record Number: [id number] Patient Account Number: [id number] Date of Birth/Sex: Treating RN: June 17, 1941 (80 y.o. [name], [name] Primary Care Kenyan Karnes: [name]., [name] Other Clinician: Referring Abbie Berling: Treating Annalaura Sauseda/Extender: Mack Hook., [name] in Treatment: 58 Wound Status Wound Number: 19 Primary Venous Leg Ulcer Etiology: Wound Location: Left, Posterior Lower Leg Secondary Lymphedema Wounding Event: Blister Etiology: Date Acquired: 12/04/2020 Wound Open Weeks Of Treatment: 6 Status: Clustered Wound: No Comorbid Anemia, Congestive Heart Failure, Hypertension, Peripheral History: Venous Disease, End Stage Renal Disease Wound Measurements Length: (cm) 6.5 Width: (cm) 6 Depth: (cm) 0.1 Area: (cm) 30.631 Volume: (cm) 3.063 % Reduction in Area: -524% % Reduction in Volume: -523.8% Epithelialization: Medium (34-66%) Tunneling: No Undermining: No Wound Description Classification: Full Thickness Without Exposed Support Structures Wound Margin: Flat and Intact Exudate Amount: Large Exudate Type: Serous Exudate Color: amber Foul Odor After Cleansing: No Slough/Fibrino Yes Wound Bed Granulation Amount: Medium (34-66%) Exposed Structure Granulation Quality: Red, Pink Fascia Exposed: No Necrotic Amount: Medium (34-66%) Fat Layer (Subcutaneous Tissue) Exposed: Yes Necrotic Quality: Adherent Slough Tendon Exposed: No Muscle Exposed: No Joint Exposed: No Bone Exposed: No Electronic Signature(s) Signed: 01/22/2021 6:02:39 PM By: Deon Pilling Entered By: Deon Pilling on 01/22/2021 08:04:56 -------------------------------------------------------------------------------- Wound Assessment Details Patient Name: Date of Service: [place], [place] 01/22/2021 8:00 A M Medical Record Number: [id number] Patient Account Number:  [id number] Date of Birth/Sex: Treating RN: Aug 04, 1941 (80 y.o. [name], [name] Primary Care Nayara Taplin: [name]., [name] Other Clinician: Referring Alajah Witman: Treating Sarahgrace Broman/Extender: Mack Hook., [name] in Treatment: 31 Wound Status Wound Number: 20 Primary Venous Leg Ulcer Etiology: Wound Location: Left, Lateral Lower Leg Wound Open Wounding Event: Blister Status: Date Acquired: 01/08/2021 Date Acquired: 01/08/2021 Comorbid Anemia, Congestive Heart Failure, Hypertension, Peripheral Weeks Of Treatment: 2 History: Venous Disease, End Stage Renal Disease Clustered Wound: No Photos Wound Measurements Length: (cm) 1 Width: (cm) 1.4 Depth: (cm) 0.1 Area: (cm) 1.1 Volume: (cm) 0.11 % Reduction in Area: 87.3% % Reduction in Volume: 87.3% Epithelialization: Large (67-100%) Tunneling: No Undermining: No Wound Description Classification: Full Thickness Without Exposed Support Structures Wound Margin: Flat and Intact Exudate Amount: Medium Exudate Type: Serous Exudate Color: amber Foul Odor After Cleansing: No Slough/Fibrino Yes Wound Bed Granulation Amount: Large (67-100%) Exposed Structure Granulation Quality: Pink Fascia Exposed: No Necrotic Amount: Small (1-33%) Fat Layer (Subcutaneous Tissue) Exposed: Yes Necrotic Quality: Adherent Slough Tendon Exposed: No Muscle Exposed: No Joint Exposed: No Bone Exposed: No Electronic Signature(s) Signed: 01/22/2021 4:57:49 PM By: Sandre Kitty Signed: 01/22/2021 6:02:39 PM By: Deon Pilling Entered By: Sandre Kitty on 01/22/2021 16:49:03 -------------------------------------------------------------------------------- Wound Assessment Details Patient Name: Date of Service: [place], [place] 01/22/2021 8:00 A M Medical Record Number: [id number] Patient Account Number: [id number] Date of Birth/Sex: Treating RN: Oct 09, 1940 (80 y.o. [name], [name] Primary Care Winford Hehn: [name]., RO  [name] Other Clinician: Referring Aliza Moret: Treating Carron Jaggi/Extender: Mack Hook., [name] in Treatment: 25 Wound Status Wound Number: 21 Primary Lymphedema Etiology: Wound Location: Left, Anterior Lower Leg Wound Open Wounding Event: Gradually Appeared Status: Date Acquired: 01/22/2021 Comorbid Anemia, Congestive Heart Failure, Hypertension, Peripheral Weeks Of  Treatment: 0 History: Venous Disease, End Stage Renal Disease Clustered Wound: No Photos Wound Measurements Length: (cm) 2 Width: (cm) 1.8 Depth: (cm) 0.1 Area: (cm) 2.827 Volume: (cm) 0.283 % Reduction in Area: 0% % Reduction in Volume: 0% Epithelialization: Small (1-33%) Tunneling: No Undermining: No Wound Description Classification: Full Thickness Without Exposed Support Structures Wound Margin: Distinct, outline attached Exudate Amount: Medium Exudate Type: Serosanguineous Exudate Color: red, brown Foul Odor After Cleansing: No Slough/Fibrino No Wound Bed Granulation Amount: Large (67-100%) Exposed Structure Granulation Quality: Red, Pink Fascia Exposed: No Necrotic Amount: None Present (0%) Fat Layer (Subcutaneous Tissue) Exposed: Yes Tendon Exposed: No Muscle Exposed: No Joint Exposed: No Bone Exposed: No Electronic Signature(s) Signed: 01/22/2021 4:57:49 PM By: Sandre Kitty Signed: 01/22/2021 6:02:39 PM By: Deon Pilling Entered By: Sandre Kitty on 01/22/2021 16:50:06 -------------------------------------------------------------------------------- Wound Assessment Details Patient Name: Date of Service: [place], [place] 01/22/2021 8:00 A M Medical Record Number: [id number] Patient Account Number: [id number] Date of Birth/Sex: Treating RN: April 26, 1941 (80 y.o. [name], [name] Primary Care Makela Niehoff: [name]., [name] Other Clinician: Referring Maryana Pittmon: Treating Jermone Geister/Extender: Mack Hook., [name] in Treatment: 27 Wound  Status Wound Number: 22 Primary Lymphedema Etiology: Wound Location: Left, Proximal, Anterior Lower Leg Wound Open Wounding Event: Gradually Appeared Status: Date Acquired: 01/22/2021 Comorbid Anemia, Congestive Heart Failure, Hypertension, Peripheral Weeks Of Treatment: 0 History: Venous Disease, End Stage Renal Disease Clustered Wound: No Photos Wound Measurements Length: (cm) 1.1 Width: (cm) 1.3 Depth: (cm) 0.1 Area: (cm) 1.123 Volume: (cm) 0.112 % Reduction in Area: 0% % Reduction in Volume: 0% Epithelialization: Small (1-33%) Tunneling: No Undermining: No Wound Description Classification: Full Thickness Without Exposed Support Structures Wound Margin: Distinct, outline attached Exudate Amount: Medium Exudate Type: Serosanguineous Exudate Color: red, brown Foul Odor After Cleansing: No Slough/Fibrino No Wound Bed Granulation Amount: Large (67-100%) Exposed Structure Granulation Quality: Red Fascia Exposed: No Necrotic Amount: None Present (0%) Fat Layer (Subcutaneous Tissue) Exposed: Yes Tendon Exposed: No Muscle Exposed: No Joint Exposed: No Bone Exposed: No Electronic Signature(s) Signed: 01/22/2021 4:57:49 PM By: Sandre Kitty Signed: 01/22/2021 6:02:39 PM By: Deon Pilling Entered By: Sandre Kitty on 01/22/2021 16:49:40 -------------------------------------------------------------------------------- Vitals Details Patient Name: Date of Service: [place], [place] 01/22/2021 8:00 A M Medical Record Number: [id number] Patient Account Number: [id number] Date of Birth/Sex: Treating RN: 12-Nov-1940 (80 y.o. [name], [name] Primary Care Adlai Nieblas: [name]., [name] Other Clinician: Referring Vinia Jemmott: Treating Verdell Dykman/Extender: Mack Hook., [name] in Treatment: 44 Vital Signs Time Taken: 07:50 Temperature (F): 97.7 Height (in): 71 Pulse (bpm): 95 Weight (lbs): 198 Respiratory Rate (breaths/min): 18 Body Mass  Index (BMI): 27.6 Blood Pressure (mmHg): 111/65 Reference Range: 80 - 120 mg / dl Electronic Signature(s) Signed: 01/22/2021 6:02:39 PM By: Deon Pilling Entered By: Deon Pilling on 01/22/2021 08:03:21

## 2021-01-23 ENCOUNTER — Emergency Department (HOSPITAL_COMMUNITY): Payer: Medicare Other

## 2021-01-23 ENCOUNTER — Encounter (HOSPITAL_COMMUNITY): Payer: Self-pay | Admitting: Emergency Medicine

## 2021-01-23 ENCOUNTER — Inpatient Hospital Stay (HOSPITAL_COMMUNITY)
Admission: EM | Admit: 2021-01-23 | Discharge: 2021-02-10 | DRG: 291 | Disposition: A | Discharge status: Hospice | Payer: Medicare Other | Attending: Internal Medicine | Admitting: Internal Medicine

## 2021-01-23 DIAGNOSIS — F1010 Alcohol abuse, uncomplicated: Secondary | ICD-10-CM | POA: Diagnosis present

## 2021-01-23 DIAGNOSIS — I5082 Biventricular heart failure: Secondary | ICD-10-CM | POA: Diagnosis present

## 2021-01-23 DIAGNOSIS — D631 Anemia in chronic kidney disease: Secondary | ICD-10-CM | POA: Diagnosis present

## 2021-01-23 DIAGNOSIS — F1020 Alcohol dependence, uncomplicated: Secondary | ICD-10-CM | POA: Diagnosis present

## 2021-01-23 DIAGNOSIS — I513 Intracardiac thrombosis, not elsewhere classified: Secondary | ICD-10-CM | POA: Diagnosis present

## 2021-01-23 DIAGNOSIS — T501X6A Underdosing of loop [high-ceiling] diuretics, initial encounter: Secondary | ICD-10-CM | POA: Diagnosis present

## 2021-01-23 DIAGNOSIS — K7031 Alcoholic cirrhosis of liver with ascites: Secondary | ICD-10-CM | POA: Diagnosis present

## 2021-01-23 DIAGNOSIS — N320 Bladder-neck obstruction: Secondary | ICD-10-CM | POA: Diagnosis present

## 2021-01-23 DIAGNOSIS — R601 Generalized edema: Secondary | ICD-10-CM

## 2021-01-23 DIAGNOSIS — E785 Hyperlipidemia, unspecified: Secondary | ICD-10-CM | POA: Diagnosis present

## 2021-01-23 DIAGNOSIS — Z8616 Personal history of COVID-19: Secondary | ICD-10-CM

## 2021-01-23 DIAGNOSIS — E875 Hyperkalemia: Secondary | ICD-10-CM | POA: Diagnosis present

## 2021-01-23 DIAGNOSIS — N138 Other obstructive and reflux uropathy: Secondary | ICD-10-CM | POA: Diagnosis present

## 2021-01-23 DIAGNOSIS — Z66 Do not resuscitate: Secondary | ICD-10-CM | POA: Diagnosis not present

## 2021-01-23 DIAGNOSIS — Z91138 Patient's unintentional underdosing of medication regimen for other reason: Secondary | ICD-10-CM

## 2021-01-23 DIAGNOSIS — E119 Type 2 diabetes mellitus without complications: Secondary | ICD-10-CM

## 2021-01-23 DIAGNOSIS — Z87891 Personal history of nicotine dependence: Secondary | ICD-10-CM

## 2021-01-23 DIAGNOSIS — I5023 Acute on chronic systolic (congestive) heart failure: Secondary | ICD-10-CM

## 2021-01-23 DIAGNOSIS — I420 Dilated cardiomyopathy: Secondary | ICD-10-CM | POA: Diagnosis present

## 2021-01-23 DIAGNOSIS — N184 Chronic kidney disease, stage 4 (severe): Secondary | ICD-10-CM | POA: Diagnosis present

## 2021-01-23 DIAGNOSIS — E1122 Type 2 diabetes mellitus with diabetic chronic kidney disease: Secondary | ICD-10-CM | POA: Diagnosis present

## 2021-01-23 DIAGNOSIS — E8770 Fluid overload, unspecified: Secondary | ICD-10-CM

## 2021-01-23 DIAGNOSIS — E1151 Type 2 diabetes mellitus with diabetic peripheral angiopathy without gangrene: Secondary | ICD-10-CM | POA: Diagnosis present

## 2021-01-23 DIAGNOSIS — I472 Ventricular tachycardia: Secondary | ICD-10-CM | POA: Diagnosis present

## 2021-01-23 DIAGNOSIS — I5084 End stage heart failure: Secondary | ICD-10-CM | POA: Diagnosis present

## 2021-01-23 DIAGNOSIS — R188 Other ascites: Secondary | ICD-10-CM

## 2021-01-23 DIAGNOSIS — Z79899 Other long term (current) drug therapy: Secondary | ICD-10-CM

## 2021-01-23 DIAGNOSIS — Z7982 Long term (current) use of aspirin: Secondary | ICD-10-CM

## 2021-01-23 DIAGNOSIS — L8990 Pressure ulcer of unspecified site, unspecified stage: Secondary | ICD-10-CM | POA: Insufficient documentation

## 2021-01-23 DIAGNOSIS — Z8249 Family history of ischemic heart disease and other diseases of the circulatory system: Secondary | ICD-10-CM

## 2021-01-23 DIAGNOSIS — Z83438 Family history of other disorder of lipoprotein metabolism and other lipidemia: Secondary | ICD-10-CM

## 2021-01-23 DIAGNOSIS — N179 Acute kidney failure, unspecified: Secondary | ICD-10-CM | POA: Diagnosis present

## 2021-01-23 DIAGNOSIS — E8809 Other disorders of plasma-protein metabolism, not elsewhere classified: Secondary | ICD-10-CM | POA: Diagnosis present

## 2021-01-23 DIAGNOSIS — Z7189 Other specified counseling: Secondary | ICD-10-CM

## 2021-01-23 DIAGNOSIS — Z515 Encounter for palliative care: Secondary | ICD-10-CM

## 2021-01-23 DIAGNOSIS — R5381 Other malaise: Secondary | ICD-10-CM | POA: Diagnosis present

## 2021-01-23 DIAGNOSIS — I130 Hypertensive heart and chronic kidney disease with heart failure and stage 1 through stage 4 chronic kidney disease, or unspecified chronic kidney disease: Principal | ICD-10-CM | POA: Diagnosis present

## 2021-01-23 DIAGNOSIS — I5043 Acute on chronic combined systolic (congestive) and diastolic (congestive) heart failure: Secondary | ICD-10-CM | POA: Diagnosis present

## 2021-01-23 DIAGNOSIS — N401 Enlarged prostate with lower urinary tract symptoms: Secondary | ICD-10-CM | POA: Diagnosis present

## 2021-01-23 DIAGNOSIS — Z833 Family history of diabetes mellitus: Secondary | ICD-10-CM

## 2021-01-23 DIAGNOSIS — I509 Heart failure, unspecified: Secondary | ICD-10-CM

## 2021-01-23 DIAGNOSIS — I10 Essential (primary) hypertension: Secondary | ICD-10-CM | POA: Diagnosis present

## 2021-01-23 DIAGNOSIS — R531 Weakness: Secondary | ICD-10-CM

## 2021-01-23 DIAGNOSIS — I959 Hypotension, unspecified: Secondary | ICD-10-CM | POA: Diagnosis present

## 2021-01-23 LAB — CBC WITH DIFFERENTIAL/PLATELET
Abs Immature Granulocytes: 0.02 10*3/uL (ref 0.00–0.07)
Basophils Absolute: 0 10*3/uL (ref 0.0–0.1)
Basophils Relative: 1 %
Eosinophils Absolute: 0.1 10*3/uL (ref 0.0–0.5)
Eosinophils Relative: 3 %
HCT: 28.6 % — ABNORMAL LOW (ref 39.0–52.0)
Hemoglobin: 8.5 g/dL — ABNORMAL LOW (ref 13.0–17.0)
Immature Granulocytes: 1 %
Lymphocytes Relative: 6 %
Lymphs Abs: 0.2 10*3/uL — ABNORMAL LOW (ref 0.7–4.0)
MCH: 26.4 pg (ref 26.0–34.0)
MCHC: 29.7 g/dL — ABNORMAL LOW (ref 30.0–36.0)
MCV: 88.8 fL (ref 80.0–100.0)
Monocytes Absolute: 0.3 10*3/uL (ref 0.1–1.0)
Monocytes Relative: 8 %
Neutro Abs: 3.2 10*3/uL (ref 1.7–7.7)
Neutrophils Relative %: 81 %
Platelets: 267 10*3/uL (ref 150–400)
RBC: 3.22 MIL/uL — ABNORMAL LOW (ref 4.22–5.81)
RDW: 18.8 % — ABNORMAL HIGH (ref 11.5–15.5)
WBC: 3.9 10*3/uL — ABNORMAL LOW (ref 4.0–10.5)
nRBC: 0 % (ref 0.0–0.2)

## 2021-01-23 LAB — COMPREHENSIVE METABOLIC PANEL
ALT: 7 U/L (ref 0–44)
AST: 15 U/L (ref 15–41)
Albumin: 2.7 g/dL — ABNORMAL LOW (ref 3.5–5.0)
Alkaline Phosphatase: 55 U/L (ref 38–126)
Anion gap: 11 (ref 5–15)
BUN: 66 mg/dL — ABNORMAL HIGH (ref 8–23)
CO2: 20 mmol/L — ABNORMAL LOW (ref 22–32)
Calcium: 8.7 mg/dL — ABNORMAL LOW (ref 8.9–10.3)
Chloride: 107 mmol/L (ref 98–111)
Creatinine, Ser: 2.67 mg/dL — ABNORMAL HIGH (ref 0.61–1.24)
GFR, Estimated: 24 mL/min — ABNORMAL LOW (ref >60)
Glucose, Bld: 104 mg/dL — ABNORMAL HIGH (ref 70–99)
Potassium: 4.3 mmol/L (ref 3.5–5.1)
Sodium: 138 mmol/L (ref 135–145)
Total Bilirubin: 0.8 mg/dL (ref 0.3–1.2)
Total Protein: 9 g/dL — ABNORMAL HIGH (ref 6.5–8.1)

## 2021-01-23 LAB — RESP PANEL BY RT-PCR (FLU A&B, COVID) ARPGX2
Influenza A by PCR: NEGATIVE
Influenza B by PCR: NEGATIVE
SARS Coronavirus 2 by RT PCR: NEGATIVE

## 2021-01-23 LAB — BRAIN NATRIURETIC PEPTIDE: B Natriuretic Peptide: 2820 pg/mL — ABNORMAL HIGH (ref 0.0–100.0)

## 2021-01-23 LAB — TROPONIN I (HIGH SENSITIVITY): Troponin I (High Sensitivity): 41 ng/L — ABNORMAL HIGH (ref <18)

## 2021-01-23 MED ORDER — FUROSEMIDE 10 MG/ML IJ SOLN
40.0000 mg | Freq: Once | INTRAMUSCULAR | Status: AC
  Administered 2021-01-24: 40 mg via INTRAVENOUS
  Filled 2021-01-23: qty 4

## 2021-01-23 NOTE — ED Triage Notes (Signed)
Pt BIB EMS from home c/o testicle swelling and exertional dyspnea. Hx of ascites and CHF. Last paracentesis x 1 month ago. Denies other complaints.

## 2021-01-23 NOTE — ED Provider Notes (Signed)
H/o ascites/cirrhosis, frequent paracentesis No lasix in 2 days Pending labs, CXR  Anticipate admission for diuresis, ?paracentesis  11:45 - recheck and introduction to the patient. Pleasant elderly gentleman, resting comfortably, VSS.  Significant edema of LE's into thighs Abdominal distended, tense, nontender, c/w ascites Marked scrotal swelling that is nontender CXR reviewed, c/w CHF 40 mg Lasix ordered/pending.  Will require admission. Hospitalist paged  He states he has not had his Demedex in days because of insurance issues, "not time for refill". He has tried to coordinate monthly paracentesis through PCP as previously recommended but has not been established yet. Last paracentesis 4/22 via IR.   Patient has no complaints. COVID negative. Ready for admission.   Discussed with Dr. Marlowe Sax who accepts the patient for admission.    Charlann Lange, PA-C 01/24/21 0017    Arnaldo Natal, MD 01/24/21 [phone]

## 2021-01-23 NOTE — ED Provider Notes (Signed)
[facility] DEPT Provider Note   CSN: [id number] Arrival date & time: 01/23/21  2114     History Chief Complaint  Patient presents with  . Groin Swelling  . Shortness of Breath    [name] is a 80 y.o. male.  HPI   80 year old male with a history of hyperlipidemia, diabetes, alcohol abuse, CHF, hypertension, cholecystitis, CKD, cirrhosis, who presents the emergency department today for evaluation of scrotal swelling.  Patient states that his abdomen has been swollen for the last month however the last 3 days he has developed scrotal swelling and swelling to his abdomen is worse.  He notes he has been out of his torsemide for the last 2 days because insurance will not fill his prescription.  He reports associated shortness of breath but denies chest pain.  He denies cough or fevers.  Denies any nausea or vomiting.  Last paracentesis 1 month ago.  Past Medical History:  Diagnosis Date  . Acute cholecystitis 11/23/2017  . Acute GI bleeding 06/30/2019  . Acute respiratory failure with hypoxia ([place])   . Acute upper back pain   . AKI (acute kidney injury) ([place])   . Alcohol abuse 02/26/2015  . Anemia, deficiency   . Back pain    LUMBOSACRAL  . Bronchitis, complicated   . Cardiogenic shock ([place])   . Cardiomegaly   . Cholangitis due to bile duct calculus with obstruction   . Chronic combined systolic and diastolic CHF (congestive heart failure) ([place])   . Chronic pain    HIGH RISK MED USE COMPREHENSIVE HIGH RISK  . CKD (chronic kidney disease), stage III ([place])   . Demand ischemia ([place])    a. minimally elevated troponin peak 0.11 in 2016, felt demand ischemia. Cath offered but pt wished to have done back in [place].  . Diabetes mellitus without complication ([place])   . Edema, leg   . Elevated troponin 02/26/2015  . Essential hypertension   . Fatigue   . Heart abnormality   . Hyperlipidemia   . Hypertension   . Neck pain, acute   . PVD (peripheral  vascular disease) ([place])   . Renal lesion 11/23/2017  . SOB (shortness of breath) on exertion   . Solitary kidney   . Vitamin D deficiency     Patient Active Problem List   Diagnosis Date Noted  . Volume overload 10/12/2020  . GERD without esophagitis 10/11/2020  . 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 Cirrhosis of liver with ascites ([place]) 10/11/2020  . Chronic systolic CHF (congestive heart failure) ([place])   . Symptomatic anemia   . Multiple gastric ulcers   . CKD (chronic kidney disease), stage IV ([place]) 05/15/2020  . Chest pain 05/15/2020  . Palliative care encounter   . Acute on chronic congestive heart failure ([place]) 02/04/2019  . Anemia of chronic disease 03/08/2018  . Acute cholecystitis 11/23/2017  . Renal lesion 11/23/2017  . Alcohol abuse 02/26/2015  . Mixed hyperlipidemia   . Diabetes mellitus without complication ([place])   . Acute on chronic combined systolic and diastolic CHF (congestive heart failure) ([place])   . Essential hypertension     Past Surgical History:  Procedure Laterality Date  . BIOPSY  05/16/2020   Procedure: BIOPSY;  Surgeon: Carol Ada, MD;  Location: [facility] ENDOSCOPY;  Service: Endoscopy;;  . ENDOSCOPIC RETROGRADE CHOLANGIOPANCREATOGRAPHY (ERCP) WITH PROPOFOL N/A 06/19/2019   Procedure: ENDOSCOPIC RETROGRADE CHOLANGIOPANCREATOGRAPHY (ERCP) WITH PROPOFOL;  Surgeon: Milus Banister, MD;  Location: [facility] ENDOSCOPY;  Service: Endoscopy;  Laterality: N/A;  . ESOPHAGOGASTRODUODENOSCOPY (EGD) WITH PROPOFOL N/A 07/01/2019   Procedure: ESOPHAGOGASTRODUODENOSCOPY (EGD) WITH PROPOFOL;  Surgeon: Rush Landmark Telford Nab., MD;  Location: [facility];  Service: Gastroenterology;  Laterality: N/A;  . ESOPHAGOGASTRODUODENOSCOPY (EGD) WITH PROPOFOL N/A 05/16/2020   Procedure: ESOPHAGOGASTRODUODENOSCOPY (EGD) WITH PROPOFOL;  Surgeon: Carol Ada, MD;  Location: [facility] ENDOSCOPY;  Service: Endoscopy;  Laterality: N/A;  . HOT HEMOSTASIS N/A 05/16/2020    Procedure: HOT HEMOSTASIS (ARGON PLASMA COAGULATION/BICAP);  Surgeon: Carol Ada, MD;  Location: [facility] ENDOS[facility];  Service: Endoscopy;  Laterality: N/A;  . IR CHOLANGIOGRAM EXISTING TUBE  12/27/2017  . IR PERC CHOLECYSTOSTOMY  11/25/2017  . IR RADIOLOGIST EVAL & MGMT  02/02/2018  . REMOVAL OF STONES  06/19/2019   Procedure: REMOVAL OF STONES;  Surgeon: Milus Banister, MD;  Location: [facility] ENDOSCOPY;  Service: Endoscopy;;  . Joan Mayans  06/19/2019   Procedure: Joan Mayans;  Surgeon: Milus Banister, MD;  Location: [facility] ENDOSCOPY;  Service: Endoscopy;;       Family History  Problem Relation Age of Onset  . Other Mother        died in her sleep at 5  . Hypertension Sister   . Hyperlipidemia Sister   . Diabetes Mellitus II Sister   . Heart disease Sister   . CAD Neg Hx        neg hx premature CAD    Social History   Tobacco Use  . Smoking status: Former Smoker    Types: Cigarettes    Quit date: 02/26/2008    Years since quitting: 12.9  . Smokeless tobacco: Never Used  Vaping Use  . Vaping Use: Never used  Substance Use Topics  . Alcohol use: Yes    Comment: occ  . Drug use: No    Home Medications Prior to Admission medications   Medication Sig Start Date End Date Taking? Authorizing Provider  aspirin EC 81 MG tablet Take 81 mg by mouth daily. Swallow whole.    [provider]  carvedilol (COREG) 3.125 MG tablet Take 1 tablet (3.125 mg total) by mouth 2 (two) times daily with a meal. 10/15/20 01/13/21  Dahal, Marlowe Aschoff, MD  pantoprazole (PROTONIX) 40 MG tablet Take 1 tablet (40 mg total) by mouth daily. 10/15/20 01/13/21  Terrilee Croak, MD  torsemide (DEMADEX) 20 MG tablet Take 40 mg by mouth daily.    [provider]    Allergies    Patient has no known allergies.  Review of Systems   Review of Systems  Constitutional: Negative for fever.  HENT: Negative for ear pain and sore throat.   Eyes: Negative for visual disturbance.  Respiratory: Negative for  cough and shortness of breath.   Cardiovascular: Positive for leg swelling. Negative for chest pain.  Gastrointestinal: Negative for abdominal pain, constipation, diarrhea, nausea and vomiting.  Genitourinary: Negative for dysuria and hematuria.  Musculoskeletal: Negative for back pain.  Skin: Negative for rash.  Neurological: Negative for seizures and syncope.  All other systems reviewed and are negative.   Physical Exam Updated Vital Signs BP 126/84 (BP Location: Left Arm)   Pulse 93   Temp 97.7 F (36.5 C) (Oral)   Resp 20   SpO2 100%   Physical Exam Vitals and nursing note reviewed.  Constitutional:      Appearance: He is well-developed.  HENT:     Head: Normocephalic and atraumatic.  Eyes:     Conjunctiva/sclera: Conjunctivae normal.  Cardiovascular:     Rate and Rhythm: Normal  rate and regular rhythm.     Heart sounds: No murmur heard.   Pulmonary:     Effort: Pulmonary effort is normal. No respiratory distress.     Breath sounds: Decreased breath sounds present.  Abdominal:     Palpations: Abdomen is soft.     Comments: Distended abdomen with easily reducible umbilical hernia  Genitourinary:    Comments: Chaperone present, pts scrotum and penis are significantly edematous.  There is no skin breakdown, erythema or warmth noted to the skin.  Patient has chronic indwelling Foley Musculoskeletal:     Cervical back: Neck supple.     Right lower leg: Edema present.     Left lower leg: Edema present.     Comments: Edema noted to the ble up to the mid back  Skin:    General: Skin is warm and dry.  Neurological:     Mental Status: He is alert.     ED Results / Procedures / Treatments   Labs (all labs ordered are listed, but only abnormal results are displayed) Labs Reviewed  RESP PANEL BY RT-PCR (FLU A&B, COVID) [id number]  COMPREHENSIVE METABOLIC PANEL  CBC WITH DIFFERENTIAL/PLATELET  BRAIN NATRIURETIC PEPTIDE  TROPONIN I (HIGH SENSITIVITY)     EKG None  Radiology No results found.  Procedures Procedures   Medications Ordered in ED Medications - No data to display  ED Course  I have reviewed the triage vital signs and the nursing notes.  Pertinent labs & imaging results that were available during my care of the patient were reviewed by me and considered in my medical decision making (see chart for details).    MDM Rules/Calculators/A&P                          80 year old male presenting the emergency department today for evaluation of fluid overload and scrotal swelling.  Has history of CHF, CKD, cirrhosis that has required frequent paracenteses in the past.  His last paracentesis was 1 month ago.  His scrotal swelling to started 3 days ago and has been exacerbated by missing his recent doses of torsemide.  At shift change, patient pending laboratory work, chest x-ray and EKG.  I suspect he will likely need admission for diuresis.  Care transitioned to Charlann Lange, PA-C to f/u on pending w/u and likely admission   Final Clinical Impression(s) / ED Diagnoses Final diagnoses:  Hypervolemia, unspecified hypervolemia type    Rx / DC Orders ED Discharge Orders    None       Rodney Booze, PA-C 01/23/21 2200    Arnaldo Natal, MD 01/23/21 [phone]

## 2021-01-24 ENCOUNTER — Inpatient Hospital Stay (HOSPITAL_COMMUNITY): Payer: Medicare Other

## 2021-01-24 ENCOUNTER — Other Ambulatory Visit: Payer: Self-pay

## 2021-01-24 ENCOUNTER — Encounter (HOSPITAL_COMMUNITY): Payer: Self-pay | Admitting: Internal Medicine

## 2021-01-24 DIAGNOSIS — I5023 Acute on chronic systolic (congestive) heart failure: Secondary | ICD-10-CM | POA: Diagnosis present

## 2021-01-24 DIAGNOSIS — D631 Anemia in chronic kidney disease: Secondary | ICD-10-CM | POA: Diagnosis present

## 2021-01-24 DIAGNOSIS — I10 Essential (primary) hypertension: Secondary | ICD-10-CM | POA: Diagnosis not present

## 2021-01-24 DIAGNOSIS — E8809 Other disorders of plasma-protein metabolism, not elsewhere classified: Secondary | ICD-10-CM | POA: Diagnosis present

## 2021-01-24 DIAGNOSIS — E785 Hyperlipidemia, unspecified: Secondary | ICD-10-CM | POA: Diagnosis present

## 2021-01-24 DIAGNOSIS — E875 Hyperkalemia: Secondary | ICD-10-CM | POA: Diagnosis present

## 2021-01-24 DIAGNOSIS — I130 Hypertensive heart and chronic kidney disease with heart failure and stage 1 through stage 4 chronic kidney disease, or unspecified chronic kidney disease: Secondary | ICD-10-CM | POA: Diagnosis present

## 2021-01-24 DIAGNOSIS — I509 Heart failure, unspecified: Secondary | ICD-10-CM

## 2021-01-24 DIAGNOSIS — Z515 Encounter for palliative care: Secondary | ICD-10-CM | POA: Diagnosis not present

## 2021-01-24 DIAGNOSIS — R601 Generalized edema: Secondary | ICD-10-CM | POA: Diagnosis present

## 2021-01-24 DIAGNOSIS — I5021 Acute systolic (congestive) heart failure: Secondary | ICD-10-CM | POA: Diagnosis not present

## 2021-01-24 DIAGNOSIS — Z66 Do not resuscitate: Secondary | ICD-10-CM | POA: Diagnosis not present

## 2021-01-24 DIAGNOSIS — Z8616 Personal history of COVID-19: Secondary | ICD-10-CM | POA: Diagnosis not present

## 2021-01-24 DIAGNOSIS — N184 Chronic kidney disease, stage 4 (severe): Secondary | ICD-10-CM | POA: Diagnosis present

## 2021-01-24 DIAGNOSIS — K7031 Alcoholic cirrhosis of liver with ascites: Secondary | ICD-10-CM | POA: Diagnosis present

## 2021-01-24 DIAGNOSIS — I472 Ventricular tachycardia: Secondary | ICD-10-CM | POA: Diagnosis present

## 2021-01-24 DIAGNOSIS — I5043 Acute on chronic combined systolic (congestive) and diastolic (congestive) heart failure: Secondary | ICD-10-CM | POA: Diagnosis present

## 2021-01-24 DIAGNOSIS — I513 Intracardiac thrombosis, not elsewhere classified: Secondary | ICD-10-CM | POA: Diagnosis present

## 2021-01-24 DIAGNOSIS — N179 Acute kidney failure, unspecified: Secondary | ICD-10-CM | POA: Diagnosis present

## 2021-01-24 DIAGNOSIS — N320 Bladder-neck obstruction: Secondary | ICD-10-CM | POA: Diagnosis present

## 2021-01-24 DIAGNOSIS — E119 Type 2 diabetes mellitus without complications: Secondary | ICD-10-CM | POA: Diagnosis not present

## 2021-01-24 DIAGNOSIS — I5084 End stage heart failure: Secondary | ICD-10-CM | POA: Diagnosis present

## 2021-01-24 DIAGNOSIS — Z7189 Other specified counseling: Secondary | ICD-10-CM | POA: Diagnosis not present

## 2021-01-24 DIAGNOSIS — I5082 Biventricular heart failure: Secondary | ICD-10-CM | POA: Diagnosis present

## 2021-01-24 DIAGNOSIS — R531 Weakness: Secondary | ICD-10-CM | POA: Diagnosis not present

## 2021-01-24 DIAGNOSIS — E1122 Type 2 diabetes mellitus with diabetic chronic kidney disease: Secondary | ICD-10-CM | POA: Diagnosis present

## 2021-01-24 DIAGNOSIS — N138 Other obstructive and reflux uropathy: Secondary | ICD-10-CM | POA: Diagnosis present

## 2021-01-24 DIAGNOSIS — E1151 Type 2 diabetes mellitus with diabetic peripheral angiopathy without gangrene: Secondary | ICD-10-CM | POA: Diagnosis present

## 2021-01-24 DIAGNOSIS — I959 Hypotension, unspecified: Secondary | ICD-10-CM | POA: Diagnosis present

## 2021-01-24 DIAGNOSIS — F1010 Alcohol abuse, uncomplicated: Secondary | ICD-10-CM | POA: Diagnosis not present

## 2021-01-24 DIAGNOSIS — F1020 Alcohol dependence, uncomplicated: Secondary | ICD-10-CM | POA: Diagnosis present

## 2021-01-24 DIAGNOSIS — I420 Dilated cardiomyopathy: Secondary | ICD-10-CM | POA: Diagnosis present

## 2021-01-24 LAB — TROPONIN I (HIGH SENSITIVITY): Troponin I (High Sensitivity): 42 ng/L — ABNORMAL HIGH (ref <18)

## 2021-01-24 LAB — MAGNESIUM: Magnesium: 1.9 mg/dL (ref 1.7–2.4)

## 2021-01-24 LAB — PHOSPHORUS: Phosphorus: 3.1 mg/dL (ref 2.5–4.6)

## 2021-01-24 MED ORDER — LIDOCAINE HCL 1 % IJ SOLN
INTRAMUSCULAR | Status: AC
  Filled 2021-01-24: qty 10

## 2021-01-24 MED ORDER — THIAMINE HCL 100 MG/ML IJ SOLN
100.0000 mg | Freq: Every day | INTRAMUSCULAR | Status: DC
  Administered 2021-01-24: 100 mg via INTRAVENOUS
  Filled 2021-01-24: qty 2

## 2021-01-24 MED ORDER — ACETAMINOPHEN 325 MG PO TABS
650.0000 mg | ORAL_TABLET | ORAL | Status: DC | PRN
  Administered 2021-01-25 – 2021-02-01 (×4): 650 mg via ORAL
  Filled 2021-01-24 (×4): qty 2

## 2021-01-24 MED ORDER — FOLIC ACID 1 MG PO TABS
1.0000 mg | ORAL_TABLET | Freq: Every day | ORAL | Status: DC
  Administered 2021-01-25 – 2021-02-10 (×17): 1 mg via ORAL
  Filled 2021-01-24 (×17): qty 1

## 2021-01-24 MED ORDER — SODIUM CHLORIDE 0.9% FLUSH
3.0000 mL | Freq: Two times a day (BID) | INTRAVENOUS | Status: DC
  Administered 2021-01-24 – 2021-02-10 (×30): 3 mL via INTRAVENOUS

## 2021-01-24 MED ORDER — FUROSEMIDE 10 MG/ML IJ SOLN
80.0000 mg | Freq: Two times a day (BID) | INTRAMUSCULAR | Status: DC
  Administered 2021-01-24 – 2021-01-27 (×8): 80 mg via INTRAVENOUS
  Filled 2021-01-24 (×8): qty 8

## 2021-01-24 MED ORDER — ADULT MULTIVITAMIN W/MINERALS CH
1.0000 | ORAL_TABLET | Freq: Every day | ORAL | Status: DC
  Administered 2021-01-25 – 2021-02-10 (×17): 1 via ORAL
  Filled 2021-01-24 (×17): qty 1

## 2021-01-24 MED ORDER — SODIUM CHLORIDE 0.9 % IV SOLN
250.0000 mL | INTRAVENOUS | Status: DC | PRN
Start: 2021-01-24 — End: 2021-02-10

## 2021-01-24 MED ORDER — LORAZEPAM 2 MG/ML IJ SOLN: 1.0000 mg | INTRAMUSCULAR | Status: AC | PRN

## 2021-01-24 MED ORDER — SODIUM CHLORIDE 0.9% FLUSH
3.0000 mL | INTRAVENOUS | Status: DC | PRN
  Administered 2021-01-24: 3 mL via INTRAVENOUS

## 2021-01-24 MED ORDER — CHLORHEXIDINE GLUCONATE CLOTH 2 % EX PADS
6.0000 | MEDICATED_PAD | Freq: Every day | CUTANEOUS | Status: DC
  Administered 2021-01-24 – 2021-02-10 (×18): 6 via TOPICAL

## 2021-01-24 MED ORDER — LORAZEPAM 1 MG PO TABS: 1.0000 mg | ORAL_TABLET | ORAL | Status: AC | PRN

## 2021-01-24 MED ORDER — THIAMINE HCL 100 MG PO TABS
100.0000 mg | ORAL_TABLET | Freq: Every day | ORAL | Status: DC
  Administered 2021-01-25 – 2021-02-10 (×17): 100 mg via ORAL
  Filled 2021-01-24 (×17): qty 1

## 2021-01-24 MED ORDER — HEPARIN SODIUM (PORCINE) 5000 UNIT/ML IJ SOLN
5000.0000 [IU] | Freq: Three times a day (TID) | INTRAMUSCULAR | Status: DC
  Administered 2021-01-24 – 2021-02-05 (×37): 5000 [IU] via SUBCUTANEOUS
  Filled 2021-01-24 (×34): qty 1

## 2021-01-24 NOTE — Consult Note (Addendum)
CARDIOLOGY CONSULT NOTE  Patient ID: [name], MRN: [id number], DOB/AGE: September 15, 1940 80 y.o. Admit date: 01/23/2021 Date of Consult: 01/24/2021  Primary Physician: [name], MD Primary Cardiologist:O neal  [name] is a 80 y.o. male who is being seen today for the evaluation of anasarca at the request of Dr .    Laurel Dimmer Complaint: Swollen up to my waist   HPI [name] is a 80 y.o. 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  Because of the glitch in insurance apparently, ran out of his torsemide.  Now with edema up to his scrotum.  Dyspnea not particularly worse.  Has required paracentesis x2 2/22 and again 4/22 with abdominal distention has had more shortness of breath.  Functionally very limited to across his apartment.  Does not have significant orthopnea.  Troponin is only borderline elevated  History of cirrhosis in the setting of longstanding alcohol use apparently no longer drinking Diet is described as deplete and sodium and in salt    Cardiomyopathy noted 2013 with an EF of 21% apparently in [place].  Most recent EF 10-20% 2/22.  History of LV thrombus but not on anticoagulation secondary to history of GI bleeding; echocardiogram 2/22 did not show a thrombus; renal insufficiency has precluded ACE/ARB/Arni/MRA     Seen by Dr. Greggory Brandy 5/20 and a telehealth consultation for nonsustained ventricular tachycardia.  Discussions at that time included ICD implantation.  The patient declined.     Past Medical History:  Diagnosis Date  . Acute cholecystitis 11/23/2017  . Acute GI bleeding 06/30/2019  . Acute respiratory failure with hypoxia ([place])   . Acute upper back pain   . AKI (acute kidney injury) ([place])   . Alcohol abuse 02/26/2015  . Anemia, deficiency   . Back pain    LUMBOSACRAL  . Bronchitis, complicated   . Cardiogenic shock ([place])   . Cardiomegaly   . Cholangitis due to  bile duct calculus with obstruction   . Chronic combined systolic and diastolic CHF (congestive heart failure) ([place])   . Chronic pain    HIGH RISK MED USE COMPREHENSIVE HIGH RISK  . CKD (chronic kidney disease), stage III ([place])   . Demand ischemia ([place])    a. minimally elevated troponin peak 0.11 in 2016, felt demand ischemia. Cath offered but pt wished to have done back in [place].  . Diabetes mellitus without complication ([place])   . Edema, leg   . Elevated troponin 02/26/2015  . Essential hypertension   . Fatigue   . Heart abnormality   . Hyperlipidemia   . Hypertension   . Neck pain, acute   . PVD (peripheral vascular disease) ([place])   . Renal lesion 11/23/2017  . SOB (shortness of breath) on exertion   . Solitary kidney   . Vitamin D deficiency       Surgical History:  Past Surgical History:  Procedure Laterality Date  . BIOPSY  05/16/2020   Procedure: BIOPSY;  Surgeon: Carol Ada, MD;  Location: [facility] ENDOSCOPY;  Service: Endoscopy;;  . ENDOSCOPIC RETROGRADE CHOLANGIOPANCREATOGRAPHY (ERCP) WITH PROPOFOL N/A 06/19/2019   Procedure: ENDOSCOPIC RETROGRADE CHOLANGIOPANCREATOGRAPHY (ERCP) WITH PROPOFOL;  Surgeon: Milus Banister, MD;  Location: [facility] ENDOSCOPY;  Service: Endoscopy;  Laterality: N/A;  . ESOPHAGOGASTRODUODENOSCOPY (EGD) WITH PROPOFOL N/A 07/01/2019   Procedure: ESOPHAGOGASTRODUODENOSCOPY (EGD) WITH PROPOFOL;  Surgeon: Rush Landmark Telford Nab., MD;  Location: [place];  Service: Gastroenterology;  Laterality: N/A;  . ESOPHAGOGASTRODUODENOSCOPY (EGD)  WITH PROPOFOL N/A 05/16/2020   Procedure: ESOPHAGOGASTRODUODENOSCOPY (EGD) WITH PROPOFOL;  Surgeon: Carol Ada, MD;  [facility];  Service: Endoscopy;  Laterality: N/A;  . HOT HEMOSTASIS N/A 05/16/2020   Procedure: HOT HEMOSTASIS (ARGON PLASMA COAGULATION/BICAP);  Surgeon: Carol Ada, MD;  Location: [facility];  Service: Endoscopy;  Laterality: N/A;  . IR CHOLANGIOGRAM EXISTING TUBE  12/27/2017  . IR PERC  CHOLECYSTOSTOMY  11/25/2017  . IR RADIOLOGIST EVAL & MGMT  02/02/2018  . REMOVAL OF STONES  06/19/2019   Procedure: REMOVAL OF STONES;  Surgeon: Milus Banister, MD;  Location: [facility] ENDOSCOPY;  Service: Endoscopy;;  . Joan Mayans  06/19/2019   Procedure: Joan Mayans;  Surgeon: Milus Banister, MD;  Location: [facility] ENDOSCOPY;  Service: Endoscopy;;     Home Meds: Prior to Admission medications   Medication Sig Start Date End Date Taking? Authorizing Provider  aspirin EC 81 MG tablet Take 81 mg by mouth daily. Swallow whole.   Yes [provider]  carvedilol (COREG) 3.125 MG tablet Take 1 tablet (3.125 mg total) by mouth 2 (two) times daily with a meal. 10/15/20 01/13/21 Yes Dahal, Marlowe Aschoff, MD  ibuprofen (ADVIL) 200 MG tablet Take 200 mg by mouth every 6 (six) hours as needed.   Yes [provider]  pantoprazole (PROTONIX) 40 MG tablet Take 1 tablet (40 mg total) by mouth daily. Patient taking differently: Take 40 mg by mouth 2 (two) times daily. 10/15/20 01/13/21 Yes Dahal, Marlowe Aschoff, MD  torsemide (DEMADEX) 20 MG tablet Take 40 mg by mouth daily.   Yes [provider]    Inpatient Medications:  . folic acid  1 mg Oral Daily  . furosemide  80 mg Intravenous BID  . heparin  5,000 Units Subcutaneous Q8H  . multivitamin with minerals  1 tablet Oral Daily  . sodium chloride flush  3 mL Intravenous Q12H  . thiamine  100 mg Oral Daily   Or  . thiamine  100 mg Intravenous Daily    Allergies: No Known Allergies  Social History   Socioeconomic History  . Marital status: Single    Spouse name: Not on file  . Number of children: Not on file  . Years of education: Not on file  . Highest education level: Not on file  Occupational History  . Not on file  Tobacco Use  . Smoking status: Former Smoker    Types: Cigarettes    Quit date: 02/26/2008    Years since quitting: 12.9  . Smokeless tobacco: Never Used  Vaping Use  . Vaping Use: Never used  Substance and Sexual  Activity  . Alcohol use: Yes    Comment: occ  . Drug use: No  . Sexual activity: Not on file  Other Topics Concern  . Not on file  Social History Narrative  . Not on file   Social Determinants of Health   Financial Resource Strain: Not on file  Food Insecurity: Not on file  Transportation Needs: Not on file  Physical Activity: Not on file  Stress: Not on file  Social Connections: Not on file  Intimate Partner Violence: Not on file     Family History  Problem Relation Age of Onset  . Other Mother        died in her sleep at 16  . Hypertension Sister   . Hyperlipidemia Sister   . Diabetes Mellitus II Sister   . Heart disease Sister   . CAD Neg Hx  neg hx premature CAD     ROS:  Please see the history of present illness.     All other systems reviewed and negative.    Physical Exam:  Blood pressure 124/78, pulse 88, temperature 97.7 F (36.5 C), temperature source Oral, resp. rate 16, SpO2 98 %. General: Well developed, well nourished male in no acute distress. Head: Normocephalic, atraumatic, sclera non-icteric, no xanthomas, nares are without discharge. EENT: normal  Lymph Nodes:  none Neck: Negative for carotid bruits. JVD >`10 Back:without scoliosis kyphosis presacral edema Lungs: Decreased breath sounds bilaterally.   Heart: RRR with S1 S2. No  murmur . No rubs, or gallops appreciated. Abdomen: Soft, non-tender, non-distended with normoactive bowel sounds. No hepatomegaly. No rebound/guarding. No obvious abdominal masses. Msk:  Strength and tone appear normal for age. Extremities: No clubbing or cyanosis.  4+ brawny edema>>scrotum.  Distal pedal pulses are 2+ and equal bilaterally. Skin: Warm and Dry Neuro: Alert and oriented X 3. CN III-XII intact Grossly normal sensory and motor function . Psych:  Responds to questions appropriately with a normal affect.      Labs: Chemistry Recent Labs  Lab 01/23/21 2146  NA 138  K 4.3  CL 107  CO2 20*   GLUCOSE 104*  BUN 66*  CREATININE 2.67*  CALCIUM 8.7*  PROT 9.0*  ALBUMIN 2.7*  AST 15  ALT 7  ALKPHOS 55  BILITOT 0.8  GFRNONAA 24*  ANIONGAP 11     Hematology Recent Labs  Lab 01/23/21 2146  WBC 3.9*  RBC 3.22*  HGB 8.5*  HCT 28.6*  MCV 88.8  MCH 26.4  MCHC 29.7*  RDW 18.8*  PLT 267    Cardiac EnzymesNo results for input(s): TROPONINI in the last 168 hours. No results for input(s): TROPIPOC in the last 168 hours.   BNP Recent Labs  Lab 01/23/21 2146  BNP 2,820.0*     DDimer No results for input(s): DDIMER in the last 168 hours.   Lab Results  Component Value Date   CHOL 109 02/05/2019   HDL 32 (L) 02/05/2019   LDLCALC 65 02/05/2019   TRIG 58 02/05/2019    Thyroid Function Tests: No results for input(s): TSH, T4TOTAL, T3FREE, THYROIDAB in the last 72 hours.  Invalid input(s): FREET3 Miscellaneous No results found for: DDIMER  Radiology/Studies: \  CrCl cannot be calculated (Unknown ideal weight.).  DG Chest 2 View  Result Date: 01/23/2021 CLINICAL DATA:  Shortness of breath.  Exertional dyspnea. EXAM: CHEST - 2 VIEW COMPARISON:  Most recent radiograph 12/04/2020 FINDINGS: Stable cardiomegaly, least moderate in degree. Stable mediastinal contours. Bilateral pleural effusions are new from prior. Vascular congestion. Fluid in the fissures. No pneumothorax or confluent consolidation. No acute osseous abnormalities are seen. IMPRESSION: CHF with cardiomegaly, vascular congestion, fluid in the fissures, and small bilateral pleural effusions. Electronically Signed   By: Keith Rake M.D.   On: 01/23/2021 22:14    VHQ:IONGE LVH repol LAD   Assessment and Plan:  Heart failure acute/chronic-systolic   Cardiomyopathy presumed nonischemic  Renal insufficiency grade 4  Anemia  Ascites/cirrhosis required repeated paracentesis  GI bleeding  History of medication noncompliance  Social network needs  Patient is massively volume overloaded  with surprisingly few pulmonary function.  Scheduled for paracentesis which showed help.  He will need aggressive diuresis which may be limited by his renal function.  Cardiorenal and hepatorenal syndromes may end up life limiting. Medications are limited because of his renal insufficiency.  Would continue him on carvedilol.  Would use hydralazine and nitrates.  He reports that he does not follow-up with his physicians because he cannot afford co-pays and public transportation.  Would recommend social service consultation to see how we can help him going forward       Virl Axe

## 2021-01-24 NOTE — Procedures (Signed)
Ultrasound-guided  therapeutic paracentesis performed yielding 7.4 liters of milky yellow colored fluid.  . No immediate complications. EBL is none.

## 2021-01-24 NOTE — Progress Notes (Addendum)
Patient is admitted earlier this am for volume overload, chf exacerbation ( lvef less than 20%), LV thrombus , not on anticoagulation due to noncompliance, alcohol cirrhosis,  History of recurrent ascites and reportedly his last paracentesis was a month ago, received paracentesis this am with 7liter fluids removed, cardiology following Poor prognosis, Palliative care also consulted.

## 2021-01-24 NOTE — ED Notes (Signed)
Patient to procedure

## 2021-01-24 NOTE — H&P (Signed)
History and Physical    [name] TIR:[id number] DOB: [birth year] DOA: 01/23/2021  PCP: [name], MD Patient coming from: Home  Chief Complaint: "Swollen all over"  HPI: [name] is a 80 y.o. 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  His last paracentesis was a month ago and symptoms started after he missed recent doses of torsemide.  In the ED, patient noted to have significant edema of bilateral lower extremities extending up to the thighs and abdomen with marked nontender scrotal swelling.  Slightly tachypneic, remainder of vital signs stable.  Not hypoxic.  Labs showing WBC 3.9, hemoglobin 8.5 (stable), platelet count 267K.  Sodium 138, potassium 4.3, chloride 107, bicarb 20, BUN 66, creatinine 2.6, glucose 104.  LFTs normal.  BNP significantly elevated at 2820.  Initial high-sensitivity troponin 41, repeat pending.  EKG without acute ischemic changes.  COVID and influenza PCR negative.  Chest x-ray showing cardiomegaly, pulmonary vascular congestion, fluid in the fissures, and small bilateral pleural effusions. Patient was given IV Lasix 40 mg.  Patient states he ran out of torsemide a few days ago and is now swollen all over.  States his belly, testicles, penis, and legs are all swollen.  Also endorsing dyspnea on exertion.  Denies chest pain, fevers, or cough.  Reports drinking less than 1 L of fluid every day and does not eat much salt.  States he had  fluid removed from his belly twice in the past, most recent a month ago.  Denies abdominal pain, nausea, vomiting.  Denies genital pain.  Denies current alcohol use.  No other complaints.  Review of Systems:  All systems reviewed and apart from history of presenting illness, are negative.  Past Medical History:  Diagnosis Date  . Acute cholecystitis 11/23/2017  . Acute GI bleeding 06/30/2019  . Acute respiratory failure with hypoxia ([place])   . Acute upper back pain   . AKI (acute kidney injury) ([place])   . Alcohol abuse 02/26/2015  . Anemia, deficiency   . Back pain    LUMBOSACRAL  . Bronchitis, complicated   . Cardiogenic shock ([place])   . Cardiomegaly   . Cholangitis due to bile duct calculus with obstruction   . Chronic combined systolic and diastolic CHF (congestive heart failure) ([place])   . Chronic pain    HIGH RISK MED USE COMPREHENSIVE HIGH RISK  . CKD (chronic kidney disease), stage III ([place])   . Demand ischemia ([place])    a. minimally elevated troponin peak 0.11 in 2016, felt demand ischemia. Cath offered but pt wished to have done back in [place].  . Diabetes mellitus without complication ([place])   . Edema, leg   . Elevated troponin 02/26/2015  . Essential hypertension   . Fatigue   . Heart abnormality   . Hyperlipidemia   . Hypertension   . Neck pain, acute   . PVD (peripheral vascular disease) ([place])   . Renal lesion 11/23/2017  . SOB (shortness of breath) on exertion   . Solitary kidney   . Vitamin D deficiency  Past Surgical History:  Procedure Laterality Date  . BIOPSY  05/16/2020   Procedure: BIOPSY;  Surgeon: Carol Ada, MD;  Location: [facility] ENDOSCOPY;  Service: Endoscopy;;  . ENDOSCOPIC RETROGRADE CHOLANGIOPANCREATOGRAPHY (ERCP) WITH PROPOFOL N/A 06/19/2019   Procedure: ENDOSCOPIC RETROGRADE CHOLANGIOPANCREATOGRAPHY (ERCP) WITH PROPOFOL;  Surgeon: Milus Banister, MD;  Location[facility] ENDOSCOPY;  Service: Endoscopy;  Laterality: N/A;  . ESOPHAGOGASTRODUODENOSCOPY (EGD)  WITH PROPOFOL N/A 07/01/2019   Procedure: ESOPHAGOGASTRODUODENOSCOPY (EGD) WITH PROPOFOL;  Surgeon: Rush Landmark Telford Nab., MD;  Location: [place];  Service: Gastroenterology;  Laterality: N/A;  . ESOPHAGOGASTRODUODENOSCOPY (EGD) WITH PROPOFOL N/A 05/16/2020   Procedure: ESOPHAGOGASTRODUODENOSCOPY (EGD) WITH PROPOFOL;  Surgeon: Carol Ada, MD;  Location: [facility];  Service: Endoscopy;  Laterality: N/A;  . HOT HEMOSTASIS N/A 05/16/2020   Procedure: HOT HEMOSTASIS (ARGON PLASMA COAGULATION/BICAP);  Surgeon: Carol Ada, MD;  Location: [facility];  Service: Endoscopy;  Laterality: N/A;  . IR CHOLANGIOGRAM EXISTING TUBE  12/27/2017  . IR PERC CHOLECYSTOSTOMY  11/25/2017  . IR RADIOLOGIST EVAL & MGMT  02/02/2018  . REMOVAL OF STONES  06/19/2019   Procedure: REMOVAL OF STONES;  Surgeon: Milus Banister, MD;  Location: [facility] ENDOSCOPY;  Service: Endoscopy;;  . Joan Mayans  06/19/2019   Procedure: Joan Mayans;  Surgeon: Milus Banister, MD;  Location: [facility] ENDOSCOPY;  Service: Endoscopy;;     reports that he quit smoking about 12 years ago. His smoking use included cigarettes. He has never used smokeless tobacco. He reports current alcohol use. He reports that he does not use drugs.  No Known Allergies  Family History  Problem Relation Age of Onset  . Other Mother        died in her sleep at 51  . Hypertension Sister   . Hyperlipidemia Sister   . Diabetes Mellitus II Sister   . Heart disease Sister   . CAD Neg Hx        neg hx premature CAD    Prior to Admission medications   Medication Sig Start Date End Date Taking? Authorizing Provider  aspirin EC 81 MG tablet Take 81 mg by mouth daily. Swallow whole.    [provider]  carvedilol (COREG) 3.125 MG tablet Take 1 tablet (3.125 mg total) by mouth 2 (two) times daily with a meal. 10/15/20 01/13/21  Dahal, Marlowe Aschoff, MD  pantoprazole (PROTONIX) 40 MG tablet Take 1 tablet (40 mg total) by mouth daily. 10/15/20 01/13/21  Terrilee Croak, MD  torsemide (DEMADEX) 20 MG tablet Take 40 mg by mouth daily.    [provider]    Physical Exam: Vitals:   01/24/21 0045 01/24/21 0100 01/24/21 0115 01/24/21 0130  BP: 121/72 121/77 123/88 120/75  Pulse: 97 90 93 91  Resp: 18 (!) 22 17 18   Temp:      TempSrc:      SpO2: 100% 100% 100% 98%    Physical Exam Constitutional:      General: He is not in acute distress. HENT:     Head: Normocephalic and atraumatic.  Eyes:     Extraocular Movements: Extraocular movements intact.     Conjunctiva/sclera: Conjunctivae normal.  Neck:     Comments: JVD present Cardiovascular:     Rate and Rhythm: Normal rate and regular rhythm.     Pulses: Normal pulses.  Pulmonary:     Effort: Pulmonary effort is normal. No respiratory distress.     Breath sounds: Rales present. No wheezing.  Abdominal:     General: Bowel sounds are normal. There  is distension.     Palpations: Abdomen is soft.     Tenderness: There is no abdominal tenderness. There is no guarding.     Comments: Significant abdominal distention  Genitourinary:    Comments: Chaperone present at bedside (nurse tech) Significant scrotal edema Musculoskeletal:     Cervical back: Normal range of motion and neck supple.     Right lower leg: Edema present.     Left lower leg: Edema present.  Skin:    General: Skin is warm and dry.  Neurological:     General: No focal deficit present.     Mental Status: He is alert and oriented to person, place, and time.     Labs on Admission: I have personally reviewed following labs and imaging studies  CBC: Recent Labs  Lab 01/23/21 2146  WBC 3.9*  NEUTROABS 3.2  HGB 8.5*  HCT 28.6*  MCV 88.8  PLT 244   Basic Metabolic Panel: Recent Labs  Lab 01/23/21 2146  NA 138  K 4.3  CL 107  CO2 20*  GLUCOSE 104*  BUN 66*  CREATININE 2.67*  CALCIUM 8.7*   GFR: CrCl cannot be calculated (Unknown ideal weight.). Liver Function Tests: Recent Labs  Lab  01/23/21 2146  AST 15  ALT 7  ALKPHOS 55  BILITOT 0.8  PROT 9.0*  ALBUMIN 2.7*   No results for input(s): LIPASE, AMYLASE in the last 168 hours. No results for input(s): AMMONIA in the last 168 hours. Coagulation Profile: No results for input(s): INR, PROTIME in the last 168 hours. Cardiac Enzymes: No results for input(s): CKTOTAL, CKMB, CKMBINDEX, TROPONINI in the last 168 hours. BNP (last 3 results) No results for input(s): PROBNP in the last 8760 hours. HbA1C: No results for input(s): HGBA1C in the last 72 hours. CBG: No results for input(s): GLUCAP in the last 168 hours. Lipid Profile: No results for input(s): CHOL, HDL, LDLCALC, TRIG, CHOLHDL, LDLDIRECT in the last 72 hours. Thyroid Function Tests: No results for input(s): TSH, T4TOTAL, FREET4, T3FREE, THYROIDAB in the last 72 hours. Anemia Panel: No results for input(s): VITAMINB12, FOLATE, FERRITIN, TIBC, IRON, RETICCTPCT in the last 72 hours. Urine analysis:    Component Value Date/Time   COLORURINE AMBER (A) 06/21/2019 0916   APPEARANCEUR CLOUDY (A) 06/21/2019 0916   LABSPEC 1.020 06/21/2019 0916   PHURINE 5.0 06/21/2019 0916   GLUCOSEU NEGATIVE 06/21/2019 0916   HGBUR MODERATE (A) 06/21/2019 0916   BILIRUBINUR SMALL (A) 06/21/2019 0916   KETONESUR NEGATIVE 06/21/2019 0916   PROTEINUR 30 (A) 06/21/2019 0916   NITRITE NEGATIVE 06/21/2019 0916   LEUKOCYTESUR MODERATE (A) 06/21/2019 0916    Radiological Exams on Admission: DG Chest 2 View  Result Date: 01/23/2021 CLINICAL DATA:  Shortness of breath.  Exertional dyspnea. EXAM: CHEST - 2 VIEW COMPARISON:  Most recent radiograph 12/04/2020 FINDINGS: Stable cardiomegaly, least moderate in degree. Stable mediastinal contours. Bilateral pleural effusions are new from prior. Vascular congestion. Fluid in the fissures. No pneumothorax or confluent consolidation. No acute osseous abnormalities are seen. IMPRESSION: CHF with cardiomegaly, vascular congestion, fluid in the  fissures, and small bilateral pleural effusions. Electronically Signed   By: Keith Rake M.D.   On: 01/23/2021 22:14    EKG: Independently reviewed.  Sinus rhythm with first-degree AV block, LAFB, ST abnormality in lateral leads.  No significant change since prior tracing.  Assessment/Plan Principal Problem:   CHF exacerbation (HCC) Active Problems:   Diabetes mellitus without complication ([place])   Alcohol abuse   Essential  hypertension   CKD (chronic kidney disease), stage IV (HCC)   Volume overload secondary to acute on chronic systolic CHF/end-stage cardiomyopathy, liver cirrhosis, noncompliance to diuresis Patient was seen by cardiology during hospitalization in February 2022 and not a candidate for advanced therapies given his kidney disease, liver disease, and medical noncompliance.  Echo done at that time revealed severely reduced EF of less than 20%.  He is significantly volume overloaded with peripheral edema, scrotal edema, and abdominal distention.  History of recurrent ascites and reportedly his last paracentesis was a month ago.  He missed recent doses of torsemide.  BNP significantly elevated at 2820. Chest x-ray showing cardiomegaly, pulmonary vascular congestion, fluid in the fissures, and small bilateral pleural effusions.  Not hypoxic. -Continue diuresis with IV Lasix 80 mg twice daily.  IR paracentesis.  Monitor intake and output, daily weights.  Low-sodium diet with fluid restriction.  I have consulted palliative care and sent a message to cardiology requesting consultation in the morning.  Elevated troponin Likely due to demand ischemia from decompensated heart failure.  High-sensitivity troponin mildly elevated at 41 and EKG without acute ischemic changes.  Patient is not endorsing chest pain. -Trend troponin  History of LV thrombus Not on anticoagulation due to history of GI bleed and medication noncompliance.  Echo done during hospitalization in February 2022 did  not show thrombus.  Cardiology recommended holding anticoagulation going forward as he is not a candidate for long-term anticoagulation.  Chronic alcoholism No signs of withdrawal at this time. -CIWA protocol; Ativan as needed.  Thiamine, folate, and multivitamin.  Check mag and Phos levels.  Hypertension Currently normotensive. -IV Lasix for diuresis.  Resume home Coreg after pharmacy med rec is done.  Diet controlled type 2 diabetes A1c 4.7 on 10/12/2020.  CKD stage IV Creatinine 2.6, was ranging between 2.3-2.7 during hospitalization 3 months ago. -Continue to monitor renal function with IV diuresis.  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 Family Communication: No family at bedside. Disposition Plan: Status is: Inpatient  Remains inpatient appropriate because:Inpatient level of care appropriate due to severity of illness   Dispo: The patient is from: Home              Anticipated d/c is to: Home              Patient currently is not medically stable to d/c.   Difficult to place patient No  Level of care: Level of care: Telemetry   The medical decision making on this patient was of high complexity and the patient is at high risk for clinical deterioration, therefore this is a level 3 visit.  Shela Leff MD [facility]  If 7PM-7AM, please contact night-coverage [email or web address]  01/24/2021, 2:06 AM

## 2021-01-25 DIAGNOSIS — Z515 Encounter for palliative care: Secondary | ICD-10-CM

## 2021-01-25 DIAGNOSIS — I5023 Acute on chronic systolic (congestive) heart failure: Secondary | ICD-10-CM | POA: Diagnosis not present

## 2021-01-25 DIAGNOSIS — Z7189 Other specified counseling: Secondary | ICD-10-CM

## 2021-01-25 DIAGNOSIS — R531 Weakness: Secondary | ICD-10-CM

## 2021-01-25 DIAGNOSIS — N184 Chronic kidney disease, stage 4 (severe): Secondary | ICD-10-CM

## 2021-01-25 LAB — MAGNESIUM: Magnesium: 2 mg/dL (ref 1.7–2.4)

## 2021-01-25 LAB — TSH: TSH: 3.432 u[IU]/mL (ref 0.350–4.500)

## 2021-01-25 LAB — BASIC METABOLIC PANEL
Anion gap: 5 (ref 5–15)
BUN: 69 mg/dL — ABNORMAL HIGH (ref 8–23)
CO2: 26 mmol/L (ref 22–32)
Calcium: 8.2 mg/dL — ABNORMAL LOW (ref 8.9–10.3)
Chloride: 107 mmol/L (ref 98–111)
Creatinine, Ser: 2.7 mg/dL — ABNORMAL HIGH (ref 0.61–1.24)
GFR, Estimated: 24 mL/min — ABNORMAL LOW (ref >60)
Glucose, Bld: 112 mg/dL — ABNORMAL HIGH (ref 70–99)
Potassium: 4.2 mmol/L (ref 3.5–5.1)
Sodium: 138 mmol/L (ref 135–145)

## 2021-01-25 NOTE — Progress Notes (Signed)
PROGRESS NOTE    [name]  WUX:[id number] DOB: 27-Dec-1940 DOA: 01/23/2021 PCP: [name], MD    Chief Complaint  Patient presents with  . Groin Swelling  . Shortness of Breath    Brief Narrative:  Chief Complaint: "Swollen all over"  HPI: [name] is a 80 y.o. 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  Subjective:  Is sitting up in chair, denies pain, no short of breath, on room air ,no hypoxia ,no fever Indwelling Foley with clear urine Bilateral legs wrapped  Assessment & Plan:   Principal Problem:   CHF exacerbation ([place]) Active Problems:   Diabetes mellitus without complication ([place])   Alcohol abuse   Essential hypertension   CKD (chronic kidney disease), stage IV ([facility])   Volume overloaded secondary to acute on chronic systolic CHF/end-stage cardiomyopathy, liver cirrhosis, noncompliance with diuresis -Cardiology following, diuretics per cardiology  Ascites Possibly combination from alcohol liver cirrhosis and liver congestion from CHF S/p paracentesis with 7 L removed, has been getting paracentesis once a month for the last 3 months Denies abdominal pain, no fever, no leukocytosis  LV thrombus Not a candidate for anticoagulation due to noncompliance   CKD stage IV Creatinine at baseline  Severe BPH with chronic bladder outlet obstruction Indwelling Foley catheter changed to monthly  Chronic alcoholism Report  quit drinking 6 months ago, not sure if is reliable Currently no sign of withdrawal  Chronic bilateral lower extremity wound Followed at wound care center    Body mass index is 28.26 kg/m..  .    Unresulted Labs (From admission, onward)          Start     Ordered   01/25/21 2536  Basic metabolic panel  Daily,   R      01/24/21 0156            DVT prophylaxis: heparin injection 5,000 Units Start: 01/24/21 0600   Code Status: DNR Family Communication: Patient Disposition:   Status is: Inpatient   Dispo: The patient is from: Home              Anticipated d/c is to: Home with home health              Anticipated d/c date is: To be determined, needs cardiology clearance                Consultants:   Cardiology  Palliative care  Procedures:   Paracentesis  Antimicrobials:   Anti-infectives (From admission, onward)   None          Objective: Vitals:   01/25/21 0457 01/25/21 0600 01/25/21 0936 01/25/21 1412  BP: 105/64  106/71 103/61  Pulse: 93  90 82  Resp: 20  16 20   Temp: (!) 97.5 F (36.4 C)  (!) 97.5 F (36.4 C) (!) 97.4 F (36.3 C)  TempSrc: Oral   Oral  SpO2: 98%  100% 100%  Weight:  91.9 kg      Intake/Output Summary (Last 24 hours) at 01/25/2021 1800 Last data filed at 01/25/2021 0501 Gross  per 24 hour  Intake --  Output 1400 ml  Net -1400 ml   Filed Weights   01/25/21 0600  Weight: 91.9 kg    Examination:  General exam: calm, NAD, right eye blind, indwelling Foley catheter Respiratory system: Clear to auscultation. Respiratory effort normal. Cardiovascular system: S1 & S2 heard, RRR.  Gastrointestinal system: Abdomen is lrdd distended, nontender.  Normal bowel sounds heard. Central nervous system: Alert and oriented. No focal neurological deficits. Extremities: Lower extremity edema, bilateral lower extremity wrapped Skin: No rashes, lesions or ulcers Psychiatry: Judgement and insight appear normal. Mood & affect  appropriate.     Data Reviewed: I have personally reviewed following labs and imaging studies  CBC: Recent Labs  Lab 01/23/21 2146  WBC 3.9*  NEUTROABS 3.2  HGB 8.5*  HCT 28.6*  MCV 88.8  PLT 601    Basic Metabolic Panel: Recent Labs  Lab 01/23/21 2146 01/24/21 0500 01/25/21 0448  NA 138  --  138  K 4.3  --  4.2  CL 107  --  107  CO2 20*  --  26  GLUCOSE 104*  --  112*  BUN 66*  --  69*  CREATININE 2.67*  --  2.70*  CALCIUM 8.7*  --  8.2*  MG  --  1.9 2.0  PHOS  --  3.1  --     GFR: Estimated Creatinine Clearance: 26.5 mL/min (A) (by C-G formula based on SCr of 2.7 mg/dL (H)).  Liver Function Tests: Recent Labs  Lab 01/23/21 2146  AST 15  ALT 7  ALKPHOS 55  BILITOT 0.8  PROT 9.0*  ALBUMIN 2.7*    CBG: No results for input(s): GLUCAP in the last 168 hours.   Recent Results (from the past 240 hour(s))  Resp Panel by RT-PCR (Flu A&B, Covid) Nasopharyngeal Swab     Status: None   Collection Time: 01/23/21 10:20 PM   Specimen: Nasopharyngeal Swab; Nasopharyngeal(NP) swabs in vial transport medium  Result Value Ref Range Status   SARS Coronavirus 2 by RT PCR NEGATIVE NEGATIVE Final    Comment: (NOTE) SARS-CoV-2 target nucleic acids are NOT DETECTED.  The SARS-CoV-2 RNA is generally detectable in upper respiratory specimens during the acute phase of infection. The lowest concentration of SARS-CoV-2 viral copies this assay can detect is 138 copies/mL. A negative result does not preclude SARS-Cov-2 infection and should not be used as the sole basis for treatment or other patient management decisions. A negative result may occur with  improper specimen collection/handling, submission of specimen other than nasopharyngeal swab, presence of viral mutation(s) within the areas targeted by this assay, and inadequate number of viral copies(<138 copies/mL). A negative result must be combined with clinical observations, patient history, and  epidemiological information. The expected result is Negative.  Fact Sheet for Patients:  EntrepreneurPulse.com.au  Fact Sheet for Healthcare Providers:  IncredibleEmployment.be  This test is no t yet approved or cleared by the [place] FDA and  has been authorized for detection and/or diagnosis of SARS-CoV-2 by FDA under an Emergency Use Authorization (EUA). This EUA will remain  in effect (meaning this test can be used) for the duration of the COVID-19 declaration under Section 564(b)(1) of the Act, 21 U.S.C.section 360bbb-3(b)(1), unless the authorization is terminated  or revoked sooner.       Influenza A by PCR NEGATIVE NEGATIVE Final   Influenza B by PCR NEGATIVE NEGATIVE Final    Comment: (NOTE) The Xpert Xpress SARS-CoV-2/FLU/RSV plus assay is intended as  an aid in the diagnosis of influenza from Nasopharyngeal swab specimens and should not be used as a sole basis for treatment. Nasal washings and aspirates are unacceptable for Xpert Xpress SARS-CoV-2/FLU/RSV testing.  Fact Sheet for Patients: EntrepreneurPulse.com.au  Fact Sheet for Healthcare Providers: IncredibleEmployment.be  This test is not yet approved or cleared by the [place] FDA and has been authorized for detection and/or diagnosis of SARS-CoV-2 by FDA under an Emergency Use Authorization (EUA). This EUA will remain in effect (meaning this test can be used) for the duration of the COVID-19 declaration under Section 564(b)(1) of the Act, 21 U.S.C. section 360bbb-3(b)(1), unless the authorization is terminated or revoked.  Performed at [facility], [place] [address] [place], [place] [address]          Radiology Studies: DG Chest 2 View  Result Date: 01/23/2021 CLINICAL DATA:  Shortness of breath.  Exertional dyspnea. EXAM: CHEST - 2 VIEW COMPARISON:  Most recent radiograph 12/04/2020 FINDINGS: Stable  cardiomegaly, least moderate in degree. Stable mediastinal contours. Bilateral pleural effusions are new from prior. Vascular congestion. Fluid in the fissures. No pneumothorax or confluent consolidation. No acute osseous abnormalities are seen. IMPRESSION: CHF with cardiomegaly, vascular congestion, fluid in the fissures, and small bilateral pleural effusions. Electronically Signed   By: Keith Rake M.D.   On: 01/23/2021 22:14   US Paracentesis  Result Date: 01/24/2021 INDICATION: Patient with history of heart failure, unspecified liver cirrhosis, chronic alcoholism, abdominal distension and recurrent ascites. Request is for therapeutic paracentesis. EXAM: ULTRASOUND GUIDED THERAPEUTIC PARACENTESIS MEDICATIONS: Lidocaine 1% 10 mL COMPLICATIONS: None immediate. PROCEDURE: Informed written consent was obtained from the patient after a discussion of the risks, benefits and alternatives to treatment. A timeout was performed prior to the initiation of the procedure. Initial ultrasound scanning demonstrates a large amount of ascites within the right lower abdominal quadrant. The right lower abdomen was prepped and draped in the usual sterile fashion. 1% lidocaine was used for local anesthesia. Following this, a 19 gauge, 7-cm, Yueh catheter was introduced. An ultrasound image was saved for documentation purposes. The paracentesis was performed. The catheter was removed and a dressing was applied. The patient tolerated the procedure well without immediate post procedural complication. Patient received post-procedure intravenous albumin; see nursing notes for details. FINDINGS: A total of approximately 7.4 L of milky yellow fluid was removed. IMPRESSION: Successful ultrasound-guided paracentesis yielding 7.4 liters of peritoneal fluid. Read by: Rushie Nyhan, NP Electronically Signed   By: Sandi Mariscal M.D.   On: 01/24/2021 13:19        Scheduled Meds: . Chlorhexidine Gluconate Cloth  6 each Topical  Daily  . folic acid  1 mg Oral Daily  . furosemide  80 mg Intravenous BID  . heparin  5,000 Units Subcutaneous Q8H  . multivitamin with minerals  1 tablet Oral Daily  . sodium chloride flush  3 mL Intravenous Q12H  . thiamine  100 mg Oral Daily   Or  . thiamine  100 mg Intravenous Daily   Continuous Infusions: . sodium chloride       LOS: 1 day   Time spent: 78mins Greater than 50% of this time was spent in counseling, explanation of diagnosis, planning of further management, and coordination of care.   Voice Recognition Viviann Spare dictation system was used to create this note, attempts have been made to correct errors. Please contact the author with questions and/or clarifications.   Florencia Reasons, MD PhD FACP [facility]  Available via Epic  secure chat 7am-7pm for nonurgent issues Please page for urgent issues To page the attending provider between 7A-7P or the covering provider during after hours 7P-7A, please log into the web site [email or web address] and access using universal [place] password for that web site. If you do not have the password, please call the hospital operator.    01/25/2021, 6:00 PM

## 2021-01-25 NOTE — Evaluation (Signed)
Physical Therapy Evaluation Patient Details Name: [name] MRN: [id number] DOB: August 10, 1941 Today's Date: 01/25/2021   History of Present Illness  Pt admitted through ED with c/o LE, abdominal and testicular swelling and dx with CHF exacerbation.  Pt with hx of PVD, demand ischemia, CHF, end stage cardiomegly, etoh abuse, Cirrhosis with recurrent ascites, chronic indwelling catheter, chonice LE wounds (being treated at wound center  Clinical Impression  Pt admitted as above and presenting with functional mobility limitations 2* generalized weakness, ambulatory balance deficits and limited endurance.  Pt hopes to progress to dc home with intermittent assist of son.    Follow Up Recommendations Home health PT    Equipment Recommendations  None recommended by PT    Recommendations for Other Services       Precautions / Restrictions Precautions Precautions: Fall;Other (comment) Precaution Comments: sheet wrapped to support genitals with gait Restrictions Weight Bearing Restrictions: No      Mobility  Bed Mobility               General bed mobility comments: Pt sitting EOB on arrival to room    Transfers Overall transfer level: Needs assistance Equipment used: Rolling walker (2 wheeled) Transfers: Sit to/from Stand Sit to Stand: Min assist         General transfer comment: cues for use of UEs to self assist.  Physical assist to bring wt up and fwd and to balance in initial standing  Ambulation/Gait Ambulation/Gait assistance: Min guard Gait Distance (Feet): 42 Feet Assistive device: Rolling walker (2 wheeled) Gait Pattern/deviations: Step-to pattern;Step-through pattern;Decreased step length - right;Decreased step length - left;Shuffle;Trunk flexed;Wide base of support Gait velocity: decr   General Gait Details: cues for posture and position from RW; increased BOS 2* edema; distance ltd by fatigue  Stairs            Wheelchair Mobility     Modified Rankin (Stroke Patients Only)       Balance Overall balance assessment: Needs assistance Sitting-balance support: No upper extremity supported;Feet supported Sitting balance-Leahy Scale: Good     Standing balance support: Bilateral upper extremity supported Standing balance-Leahy Scale: Poor                               Pertinent Vitals/Pain Pain Assessment: 0-10 Pain Score: 2  Pain Location: swollen testicles with movement    Home Living Family/patient expects to be discharged to:: Private residence Living Arrangements: Alone;Other (Comment) Available Help at Discharge: Family;Available PRN/intermittently Type of Home: Apartment Home Access: Level entry     Home Layout: One level Home Equipment: Walker - 2 wheels;Cane - single point;Bedside commode      Prior Function Level of Independence: Independent with assistive device(s)         Comments: pt reports using RW, mostly only ambulating to/from bathroom due to fatigue and dyspnea, uses [employer] for transportation, reports increased time for ADLs, son assists with IADLs     Hand Dominance   Dominant Hand: Right    Extremity/Trunk Assessment   Upper Extremity Assessment Upper Extremity Assessment: Generalized weakness    Lower Extremity Assessment Lower Extremity Assessment: Generalized weakness       Communication   Communication: No difficulties  Cognition Arousal/Alertness: Awake/alert Behavior During Therapy: WFL for tasks assessed/performed Overall Cognitive Status: Within Functional Limits for tasks assessed  General Comments      Exercises     Assessment/Plan    PT Assessment Patient needs continued PT services  PT Problem List Decreased strength;Decreased range of motion;Decreased activity tolerance;Decreased balance;Decreased mobility;Decreased knowledge of use of DME;Obesity;Pain       PT Treatment  Interventions DME instruction;Gait training;Functional mobility training;Therapeutic activities;Therapeutic exercise;Patient/family education;Balance training    PT Goals (Current goals can be found in the Care Plan section)  Acute Rehab PT Goals Patient Stated Goal: Reduce swelling, regain mobility and IND, return home PT Goal Formulation: With patient Time For Goal Achievement: 02/09/21 Potential to Achieve Goals: Fair    Frequency Min 3X/week   Barriers to discharge Decreased caregiver support Pt does not have 24/7    Co-evaluation               AM-PAC PT "6 Clicks" Mobility  Outcome Measure Help needed turning from your back to your side while in a flat bed without using bedrails?: A Little Help needed moving from lying on your back to sitting on the side of a flat bed without using bedrails?: A Little Help needed moving to and from a bed to a chair (including a wheelchair)?: A Little Help needed standing up from a chair using your arms (e.g., wheelchair or bedside chair)?: A Little Help needed to walk in hospital room?: A Little Help needed climbing 3-5 steps with a railing? : A Lot 6 Click Score: 17    End of Session Equipment Utilized During Treatment: Gait belt Activity Tolerance: Patient limited by fatigue Patient left: in chair;with call bell/phone within reach;with chair alarm set Nurse Communication: Mobility status PT Visit Diagnosis: Unsteadiness on feet (R26.81);Muscle weakness (generalized) (M62.81);Difficulty in walking, not elsewhere classified (R26.2)    Time: 3716-9678 PT Time Calculation (min) (ACUTE ONLY): 18 min   Charges:   PT Evaluation $PT Eval Low Complexity: 1 Low          Debe Coder PT Acute Rehabilitation Services Pager [phone] Office [phone]   Azelea Seguin 01/25/2021, 2:01 PM

## 2021-01-25 NOTE — Progress Notes (Signed)
Progress Note  Patient Name: [name] Date of Encounter: 01/25/2021  [facility] Cardiologist: Candee Furbish, MD    V Patient Profile     80 y.o. male with longstanding CM, prob endstage CHF/CM, cirrhosis with prev (says over) EtOH use, recurrent ascites with paracentesis x2 in preceding 3 months admitted with anasarca in setting of running out of meds.  Guideline directed medical therapy limited by blood pressure and renal insufficiency  Paracentesis >> 7L;  palliatve care consulted for [facility] 2/22 EF 10-20% previously described LV clot not evident   Subjective   Feels much better   Inpatient Medications    Scheduled Meds: . Chlorhexidine Gluconate Cloth  6 each Topical Daily  . folic acid  1 mg Oral Daily  . furosemide  80 mg Intravenous BID  . heparin  5,000 Units Subcutaneous Q8H  . multivitamin with minerals  1 tablet Oral Daily  . sodium chloride flush  3 mL Intravenous Q12H  . thiamine  100 mg Oral Daily   Or  . thiamine  100 mg Intravenous Daily   Continuous Infusions: . sodium chloride     PRN Meds: sodium chloride, acetaminophen, LORazepam **OR** LORazepam, sodium chloride flush   Vital Signs    Vitals:   01/24/21 2129 01/25/21 0214 01/25/21 0457 01/25/21 0600  BP: 109/70 111/64 105/64   Pulse: 94 91 93   Resp: 18 18 20    Temp: (!) 97.4 F (36.3 C) 97.6 F (36.4 C) (!) 97.5 F (36.4 C)   TempSrc: Oral Oral Oral   SpO2: 100% 99% 98%   Weight:    91.9 kg    Intake/Output Summary (Last 24 hours) at 01/25/2021 0808 Last data filed at 01/25/2021 0501 Gross per 24 hour  Intake --  Output 1400 ml  Net -1400 ml   Last 3 Weights 01/25/2021 12/04/2020 10/14/2020  Weight (lbs) 202 lb 9.6 oz 178 lb 178 lb 12.7 oz  Weight (kg) 91.9 kg 80.74 kg 81.1 kg  Some encounter information is confidential and restricted. Go to Review Flowsheets activity to see all data.      Telemetry   sinus w PVCs - Personally Reviewed  ECG       Physical Exam     GEN: No acute distress.   Neck: JVD 9-10 Cardiac: RRR, no murmurs, rubs, or gallops.  Respiratory: Clear to auscultation bilaterally. GI: Soft, nontender distended  MS: tr edema; No deformity. Neuro:  Nonfocal  Psych: Normal affect   Labs    High Sensitivity Troponin:   Recent Labs  Lab 01/23/21 2146 01/24/21 0047  TROPONINIHS 41* 42*      Chemistry Recent Labs  Lab 01/23/21 2146 01/25/21 0448  NA 138 138  K 4.3 4.2  CL 107 107  CO2 20* 26  GLUCOSE 104* 112*  BUN 66* 69*  CREATININE 2.67* 2.70*  CALCIUM 8.7* 8.2*  PROT 9.0*  --   ALBUMIN 2.7*  --   AST 15  --   ALT 7  --   ALKPHOS 55  --   BILITOT 0.8  --   GFRNONAA 24* 24*  ANIONGAP 11 5     Hematology Recent Labs  Lab 01/23/21 2146  WBC 3.9*  RBC 3.22*  HGB 8.5*  HCT 28.6*  MCV 88.8  MCH 26.4  MCHC 29.7*  RDW 18.8*  PLT 267    BNP Recent Labs  Lab 01/23/21 2146  BNP 2,820.0*     DDimer No results for input(s): DDIMER  in the last 168 hours.   Radiology    DG Chest 2 View  Result Date: 01/23/2021 CLINICAL DATA:  Shortness of breath.  Exertional dyspnea. EXAM: CHEST - 2 VIEW COMPARISON:  Most recent radiograph 12/04/2020 FINDINGS: Stable cardiomegaly, least moderate in degree. Stable mediastinal contours. Bilateral pleural effusions are new from prior. Vascular congestion. Fluid in the fissures. No pneumothorax or confluent consolidation. No acute osseous abnormalities are seen. IMPRESSION: CHF with cardiomegaly, vascular congestion, fluid in the fissures, and small bilateral pleural effusions. Electronically Signed   By: Keith Rake M.D.   On: 01/23/2021 22:14   US Paracentesis  Result Date: 01/24/2021 INDICATION: Patient with history of heart failure, unspecified liver cirrhosis, chronic alcoholism, abdominal distension and recurrent ascites. Request is for therapeutic paracentesis. EXAM: ULTRASOUND GUIDED THERAPEUTIC PARACENTESIS MEDICATIONS: Lidocaine 1% 10 mL COMPLICATIONS: None  immediate. PROCEDURE: Informed written consent was obtained from the patient after a discussion of the risks, benefits and alternatives to treatment. A timeout was performed prior to the initiation of the procedure. Initial ultrasound scanning demonstrates a large amount of ascites within the right lower abdominal quadrant. The right lower abdomen was prepped and draped in the usual sterile fashion. 1% lidocaine was used for local anesthesia. Following this, a 19 gauge, 7-cm, Yueh catheter was introduced. An ultrasound image was saved for documentation purposes. The paracentesis was performed. The catheter was removed and a dressing was applied. The patient tolerated the procedure well without immediate post procedural complication. Patient received post-procedure intravenous albumin; see nursing notes for details. FINDINGS: A total of approximately 7.4 L of milky yellow fluid was removed. IMPRESSION: Successful ultrasound-guided paracentesis yielding 7.4 liters of peritoneal fluid. Read by: Rushie Nyhan, NP Electronically Signed   By: Sandi Mariscal M.D.   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  Is there a role for GI input     For questions or updates, please contact [place] Please consult [email or web address] for contact info under        Signed, Virl Axe, MD  01/25/2021, 8:09 AM

## 2021-01-25 NOTE — Consult Note (Signed)
Consultation Note Date: 01/25/2021   Patient Name: [name]  DOB: 03-Aug-1941  MRN: [id number]  Age / Sex: 80 y.o., male  PCP: [name], MD Referring Physician: Florencia Reasons, MD  Reason for Consultation: Establishing goals of care  HPI/Patient Profile: 80 y.o. 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  Lately with anasarca recurrent ascites requiring paracentesis admitted to the hospital with worsening edema and anasarca volume overload.  Patient's echocardiogram with ejection fraction 10-20%.  Previous history of left ventricular thrombus also reported in the chart.  Required 7 L fluid removal from paracentesis.  Patient known to palliative medicine service has been seen and evaluated in 2 previous hospitalizations ovarian CODE STATUS and goals of care discussions were also undertaken.  Patient is being evaluated by cardiology, remains admitted to hospital medicine service.  Optimization of medications as best as possible is being attempted.  Palliative consultation for goals of care discussions has again been requested.  Patient is awake alert sitting up in bed.  He is eating ice cream.  He has 2-3+ lower extremity edema and scrotal edema.  Introduced myself and palliative care as follows: Palliative medicine is specialized medical care for people living with serious illness. It focuses on providing relief from the symptoms and stress of a serious illness. The goal is to improve quality of life for both the patient and the family.  Goals of care: Broad aims of medical therapy in relation to the patient's values and preferences. Our aim is to provide medical care aimed at enabling patients to achieve the  goals that matter most to them, given the circumstances of their particular medical situation and their constraints.   Patient states he is no stranger to death and dying.  He states he is not scared of death.  He shares that he had a sister who died under hospice care around 2 years ago and that she had congestive heart failure.  He states that sons and his family.  Goals wishes and values important to the patient attempted to be explored.  Discussed extensively about differences between full code versus DO NOT RESUSCITATE.  Patient states," machines at end-of-life, that don't  make sense.  If it is your time to go, it's your time to go."  As a result of this discussion we have establish DNR/DNI.  He did request that I reach out to his Kittie Plater and updated her about the nature of this conversation.  Call placed and discussed with Kittie Plater.  She states, " I understand.  It runs in her family.  His heart has been weak for a while.  God is in control."  We reviewed about optimization of patient's current medication regimen and to having at least palliative support in the outpatient setting.  Patient and her niece are agreeable, see below.  HCPOA Patient Sister Oris Drone  Cecilie Lowers at [phone] is his designated healthcare power of attorney agent.  Advance care planning documents are available in [facility] for review in epic.  Reconfirmed with patient who still designates his Sister Oris Drone as his chosen healthcare power of attorney agent.   SUMMARY OF RECOMMENDATIONS   1.  CODE STATUS now clarified as DNR/DNI 2.  Continue aggressive medical management, symptom management on an as-needed basis. 3.  Recommend addition of palliative services at the time of discharge.   Code Status/Advance Care Planning:  Extensively discussed about full code versus DNR at the time of today's initial consultation and a CODE STATUS now clarified as DNR/DNI.   Symptom Management:   Continue current mode of care,  monitor for uncontrolled symptoms.  Palliative Prophylaxis:   Delirium Protocol     Psycho-social/Spiritual:   Desire for further Chaplaincy support:yes  Additional Recommendations: Education on Hospice  Prognosis:   Unable to determine  Discharge Planning: To Be Determined      Primary Diagnoses: Present on Admission: . Alcohol abuse . Essential hypertension . CKD (chronic kidney disease), stage IV ([place])   I have reviewed the medical record, interviewed the patient and family, and examined the patient. The following aspects are pertinent.  Past Medical History:  Diagnosis Date  . Acute cholecystitis 11/23/2017  . Acute GI bleeding 06/30/2019  . Acute respiratory failure with hypoxia ([place])   . Acute upper back pain   . AKI (acute kidney injury) ([place])   . Alcohol abuse 02/26/2015  . Anemia, deficiency   . Back pain    LUMBOSACRAL  . Bronchitis, complicated   . Cardiogenic shock ([place])   . Cardiomegaly   . Cholangitis due to bile duct calculus with obstruction   . Chronic combined systolic and diastolic CHF (congestive heart failure) ([place])   . Chronic pain    HIGH RISK MED USE COMPREHENSIVE HIGH RISK  . CKD (chronic kidney disease), stage III ([place])   . Demand ischemia ([place])    a. minimally elevated troponin peak 0.11 in 2016, felt demand ischemia. Cath offered but pt wished to have done back in [place].  . Diabetes mellitus without complication ([place])   . Edema, leg   . Elevated troponin 02/26/2015  . Essential hypertension   . Fatigue   . Heart abnormality   . Hyperlipidemia   . Hypertension   . Neck pain, acute   . PVD (peripheral vascular disease) ([place])   . Renal lesion 11/23/2017  . SOB (shortness of breath) on exertion   . Solitary kidney   . Vitamin D deficiency    Social History   Socioeconomic History  . Marital status: Single    Spouse name: Not on file  . Number of children: Not on file  . Years of education: Not on file  . Highest education  level: Not on file  Occupational History  . Not on file  Tobacco Use  . Smoking status: Former Smoker    Types: Cigarettes    Quit date: 02/26/2008    Years since quitting: 12.9  . Smokeless tobacco: Never Used  Vaping Use  . Vaping Use: Never used  Substance and Sexual Activity  . Alcohol use: Yes    Comment: occ  . Drug use: No  . Sexual activity: Not on file  Other Topics Concern  . Not on file  Social History Narrative  . Not on file   Social Determinants of Health   Financial Resource Strain: Not on file  Food Insecurity:  Not on file  Transportation Needs: Not on file  Physical Activity: Not on file  Stress: Not on file  Social Connections: Not on file   Family History  Problem Relation Age of Onset  . Other Mother        died in her sleep at 87  . Hypertension Sister   . Hyperlipidemia Sister   . Diabetes Mellitus II Sister   . Heart disease Sister   . CAD Neg Hx        neg hx premature CAD   Scheduled Meds: . Chlorhexidine Gluconate Cloth  6 each Topical Daily  . folic acid  1 mg Oral Daily  . furosemide  80 mg Intravenous BID  . heparin  5,000 Units Subcutaneous Q8H  . multivitamin with minerals  1 tablet Oral Daily  . sodium chloride flush  3 mL Intravenous Q12H  . thiamine  100 mg Oral Daily   Or  . thiamine  100 mg Intravenous Daily   Continuous Infusions: . sodium chloride     PRN Meds:.sodium chloride, acetaminophen, LORazepam **OR** LORazepam, sodium chloride flush Medications Prior to Admission:  Prior to Admission medications   Medication Sig Start Date End Date Taking? Authorizing Provider  aspirin EC 81 MG tablet Take 81 mg by mouth daily. Swallow whole.   Yes [provider]  carvedilol (COREG) 3.125 MG tablet Take 1 tablet (3.125 mg total) by mouth 2 (two) times daily with a meal. 10/15/20 01/13/21 Yes Dahal, Marlowe Aschoff, MD  ibuprofen (ADVIL) 200 MG tablet Take 200 mg by mouth every 6 (six) hours as needed.   Yes [provider]  pantoprazole (PROTONIX) 40 MG tablet Take 1 tablet (40 mg total) by mouth daily. Patient taking differently: Take 40 mg by mouth 2 (two) times daily. 10/15/20 01/13/21 Yes Dahal, Marlowe Aschoff, MD  torsemide (DEMADEX) 20 MG tablet Take 40 mg by mouth daily.   Yes [provider]   No Known Allergies Review of Systems Denies chest pain denies shortness of Physical Exam Generalized edema Regular work of breathing S1-S2 No acute distress  Vital Signs: BP 106/71   Pulse 90   Temp (!) 97.5 F (36.4 C)   Resp 16   Wt 91.9 kg   SpO2 100%   BMI 28.26 kg/m  Pain Scale: 0-10   Pain Score: 1    SpO2: SpO2: 100 % O2 Device:SpO2: 100 % O2 Flow Rate: .   IO: Intake/output summary:   Intake/Output Summary (Last 24 hours) at 01/25/2021 1031 Last data filed at 01/25/2021 0501 Gross per 24 hour  Intake --  Output 1400 ml  Net -1400 ml    LBM: Last BM Date: 01/23/21 Baseline Weight: Weight: 91.9 kg Most recent weight: Weight: 91.9 kg     Palliative Assessment/Data:   Palliative performance scale 50%.  Time In: 9 Time Out: 10 Time Total: 60 Greater than 50%  of this time was spent counseling and coordinating care related to the above assessment and plan.  Signed by: Loistine Chance, MD   Please contact Palliative Medicine Team phone at [phone] for questions and concerns.  For individual provider: See Shea Evans

## 2021-01-26 ENCOUNTER — Other Ambulatory Visit (HOSPITAL_COMMUNITY): Payer: Medicare Other

## 2021-01-26 DIAGNOSIS — I5023 Acute on chronic systolic (congestive) heart failure: Secondary | ICD-10-CM | POA: Diagnosis not present

## 2021-01-26 LAB — BASIC METABOLIC PANEL
Anion gap: 7 (ref 5–15)
BUN: 66 mg/dL — ABNORMAL HIGH (ref 8–23)
CO2: 26 mmol/L (ref 22–32)
Calcium: 8.2 mg/dL — ABNORMAL LOW (ref 8.9–10.3)
Chloride: 105 mmol/L (ref 98–111)
Creatinine, Ser: 2.68 mg/dL — ABNORMAL HIGH (ref 0.61–1.24)
GFR, Estimated: 24 mL/min — ABNORMAL LOW (ref >60)
Glucose, Bld: 134 mg/dL — ABNORMAL HIGH (ref 70–99)
Potassium: 3.7 mmol/L (ref 3.5–5.1)
Sodium: 138 mmol/L (ref 135–145)

## 2021-01-26 MED ORDER — ISOSORBIDE DINITRATE 10 MG PO TABS
10.0000 mg | ORAL_TABLET | Freq: Two times a day (BID) | ORAL | Status: DC
  Administered 2021-01-26: 10 mg via ORAL
  Filled 2021-01-26 (×10): qty 1

## 2021-01-26 MED ORDER — SIMETHICONE 80 MG PO CHEW
80.0000 mg | CHEWABLE_TABLET | Freq: Once | ORAL | Status: AC
  Administered 2021-01-26: 80 mg via ORAL
  Filled 2021-01-26: qty 1

## 2021-01-26 MED ORDER — HYDRALAZINE HCL 10 MG PO TABS
10.0000 mg | ORAL_TABLET | Freq: Two times a day (BID) | ORAL | Status: DC
  Administered 2021-01-26: 10 mg via ORAL
  Filled 2021-01-26 (×7): qty 1

## 2021-01-26 NOTE — Progress Notes (Signed)
Progress Note  Patient Name: [name] Date of Encounter: 01/26/2021  [facility] Cardiologist: Candee Furbish, MD    V Patient Profile     80 y.o. male with longstanding CM, prob endstage CHF/CM, cirrhosis with prev (says over) EtOH use, recurrent ascites with paracentesis x2 in preceding 3 months admitted with anasarca in setting of running out of meds.  Guideline directed medical therapy limited by blood pressure and renal insufficiency  Paracentesis >> 7L;  palliatve care consulted for [place] Echo 2/22 EF 10-20% previously described LV clot not evident   Subjective   Feels better--less SOB and no LH with standing  Palliative care consult reviewed  Inpatient Medications    Scheduled Meds: . Chlorhexidine Gluconate Cloth  6 each Topical Daily  . folic acid  1 mg Oral Daily  . furosemide  80 mg Intravenous BID  . heparin  5,000 Units Subcutaneous Q8H  . multivitamin with minerals  1 tablet Oral Daily  . sodium chloride flush  3 mL Intravenous Q12H  . thiamine  100 mg Oral Daily   Or  . thiamine  100 mg Intravenous Daily   Continuous Infusions: . sodium chloride     PRN Meds: sodium chloride, acetaminophen, LORazepam **OR** LORazepam, sodium chloride flush   Vital Signs    Vitals:   01/25/21 2131 01/26/21 0027 01/26/21 0336 01/26/21 0500  BP: 108/80 108/62 104/65   Pulse: 95 94 94   Resp: (!) 22 20 18    Temp: 98.6 F (37 C) 98.1 F (36.7 C) 97.6 F (36.4 C)   TempSrc: Oral     SpO2: 100% 100% 100%   Weight:    91.4 kg    Intake/Output Summary (Last 24 hours) at 01/26/2021 1208 Last data filed at 01/26/2021 1000 Gross per 24 hour  Intake 600 ml  Output 2600 ml  Net -2000 ml   Last 3 Weights 01/26/2021 01/25/2021 12/04/2020  Weight (lbs) 201 lb 8 oz 202 lb 9.6 oz 178 lb  Weight (kg) 91.4 kg 91.9 kg 80.74 kg  Some encounter information is confidential and restricted. Go to Review Flowsheets activity to see all data.      Telemetry  Sinus aobut 90  -  Personally Reviewed  ECG       Physical Exam    Well developed and nourished in no acute distress HENT normal Neck supple with JVP-  flat *8 Clear Regular rate and rhythm, no murmurs or gallops Abd-soft with active BS No Clubbing cyanosis 1+ edema Skin-warm and dry A & Oriented  Grossly normal sensory and motor function    Labs    High Sensitivity Troponin:   Recent Labs  Lab 01/23/21 2146 01/24/21 0047  TROPONINIHS 41* 42*      Chemistry Recent Labs  Lab 01/23/21 2146 01/25/21 0448 01/26/21 0523  NA 138 138 138  K 4.3 4.2 3.7  CL 107 107 105  CO2 20* 26 26  GLUCOSE 104* 112* 134*  BUN 66* 69* 66*  CREATININE 2.67* 2.70* 2.68*  CALCIUM 8.7* 8.2* 8.2*  PROT 9.0*  --   --   ALBUMIN 2.7*  --   --   AST 15  --   --   ALT 7  --   --   ALKPHOS 55  --   --   BILITOT 0.8  --   --   GFRNONAA 24* 24* 24*  ANIONGAP 11 5 7      Hematology Recent Labs  Lab 01/23/21 2146  WBC 3.9*  RBC 3.22*  HGB 8.5*  HCT 28.6*  MCV 88.8  MCH 26.4  MCHC 29.7*  RDW 18.8*  PLT 267    BNP Recent Labs  Lab 01/23/21 2146  BNP 2,820.0*     DDimer No results for input(s): DDIMER in the last 168 hours.   Radiology    US Paracentesis  Result Date: 01/24/2021 INDICATION: Patient with history of heart failure, unspecified liver cirrhosis, chronic alcoholism, abdominal distension and recurrent ascites. Request is for therapeutic paracentesis. EXAM: ULTRASOUND GUIDED THERAPEUTIC PARACENTESIS MEDICATIONS: Lidocaine 1% 10 mL COMPLICATIONS: None immediate. PROCEDURE: Informed written consent was obtained from the patient after a discussion of the risks, benefits and alternatives to treatment. A timeout was performed prior to the initiation of the procedure. Initial ultrasound scanning demonstrates a large amount of ascites within the right lower abdominal quadrant. The right lower abdomen was prepped and draped in the usual sterile fashion. 1% lidocaine was used for local  anesthesia. Following this, a 19 gauge, 7-cm, Yueh catheter was introduced. An ultrasound image was saved for documentation purposes. The paracentesis was performed. The catheter was removed and a dressing was applied. The patient tolerated the procedure well without immediate post procedural complication. Patient received post-procedure intravenous albumin; see nursing notes for details. FINDINGS: A total of approximately 7.4 L of milky yellow fluid was removed. IMPRESSION: Successful ultrasound-guided paracentesis yielding 7.4 liters of peritoneal fluid. Read by: Rushie Nyhan, NP Electronically Signed   By: Sandi Mariscal M.D.   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  Is there a role for GI input Had informal discussion w GI, perhaps either GI or palliative care can "own" the protocol for recurrent ascites.--- Drains are assoc with infection so thought would be recurrent paracentesis     For questions or updates, please contact [place] Please consult [email or web address] for contact info under        Signed, Virl Axe, MD  01/26/2021, 12:08 PM

## 2021-01-26 NOTE — Progress Notes (Signed)
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  He was found to be severely volume overloaded on presentation and was started on IV Lasix.  Cardiology also consulted and following.  Assessment & Plan:   Principal Problem:   CHF exacerbation ([place]) Active Problems:   Diabetes mellitus without complication ([place])   Alcohol abuse   Essential hypertension   CKD (chronic kidney disease), stage IV (HCC)   Volume overload/severe systolic congestive heart failure: Secondary to acute on chronic systolic  heart failure/congestive cardiomyopathy.  Anasarca also contributed by liver cirrhosis/hypoalbuminemia.  Echocardiogram showed ejection fraction of 10-20% Cardiology following.  Started on IV diuretics.  Continue to monitor input/output, daily weight. Currently on hydralazine and Imdur.  Ascites: History of liver cirrhosis from alcohol.  Might also have been contributed by congestive hepatopathy.  Status post paracentesis with removal of 7 L of fluid.  Has been getting  paracentesis once a month for the last 3 months.His abdomen looks distended again. He will definitely need recurrent paracentesis in the  future.  We will check with IR if we can put a peritoneal drain.He also needs to follow up with GI as an outpatient  Left ventricular thrombus: Not a candidate for anticoagulation due to noncompliance, GI bleed  CKD stage IV: Currently kidney function at baseline.  Normocytic anemia: Currently hemoglobin stable.  Anemia is most likely acid with CKD.  He has history of GI bleed.  Severe BPH/chronic bladder outlet obstruction: On chronic indwelling Foley catheter.  Changed monthly.  Chronic alcoholism: Reports quitting drinking 6 months ago, not sure if this is true.  No signs of withdrawal.  Continue monitoring for withdrawal.  Continue thiamine folic acid.  Chronic bilateral lower extremity wound: Follows at wound care center.  Debility/deconditioning/goals of care: Palliative care also following during this hospitalization and recommend palliative care follow-up as an outpatient when he is discharged.  PT/OT recommended home health on discharge.         DVT prophylaxis:Heparin [place] Code Status: DNR Family Communication: None at bedside Status is: Inpatient  Remains inpatient appropriate because:Inpatient level of care appropriate due to severity of illness   Dispo: The patient is from: Home              Anticipated d/c is to: Home              Patient currently is not medically stable to d/c.   Difficult to place patient No     Consultants: Cardiology  Procedures:Paracentesis  Antimicrobials:  Anti-infectives (From admission, onward)   None      Subjective:  Patient seen and examined at bedside this afternoon.  He was sitting in the chair.  On room air.  Denies any worsening shortness of breath or cough.  He has significant bilateral lower extremity edema, and ascites.  He states he  is feeling better.   Objective: Vitals:   01/26/21 0027 01/26/21 0336 01/26/21 0500 01/26/21 1225  BP: 108/62 104/65  114/61  Pulse: 94 94  90  Resp: 20 18  20   Temp: 98.1 F (36.7  C) 97.6 F (36.4 C)  97.8 F (36.6 C)  TempSrc:    Oral  SpO2: 100% 100%  100%  Weight:   91.4 kg     Intake/Output Summary (Last 24 hours) at 01/26/2021 1331 Last data filed at 01/26/2021 1000 Gross per 24 hour  Intake 600 ml  Output 2600 ml  Net -2000 ml   Filed Weights   01/25/21 0600 01/26/21 0500  Weight: 91.9 kg 91.4 kg    Examination:  General exam: Not in distress, very deconditioned/debilitated HEENT:PERRL,Oral mucosa moist, Ear/Nose normal on gross exam Respiratory system: Bilateral basal or crackles  Cardiovascular system: S1 & S2 heard, RRR. No JVD, murmurs, rubs, gallops or clicks. . Gastrointestinal system: Abdomen is distended, soft and nontender. No organomegaly or masses felt. Normal bowel sounds heard.  Ascites Central nervous system: Alert and oriented.  Extremities: 2-3+ bilateral lower extremity pitting edema, no clubbing ,no cyanosis Skin: No rashes, lesions or ulcers,no icterus ,no pallor   Data Reviewed: I have personally reviewed following labs and imaging studies  CBC: Recent Labs  Lab 01/23/21 2146  WBC 3.9*  NEUTROABS 3.2  HGB 8.5*  HCT 28.6*  MCV 88.8  PLT 540   Basic Metabolic Panel: Recent Labs  Lab 01/23/21 2146 01/24/21 0500 01/25/21 0448 01/26/21 0523  NA 138  --  138 138  K 4.3  --  4.2 3.7  CL 107  --  107 105  CO2 20*  --  26 26  GLUCOSE 104*  --  112* 134*  BUN 66*  --  69* 66*  CREATININE 2.67*  --  2.70* 2.68*  CALCIUM 8.7*  --  8.2* 8.2*  MG  --  1.9 2.0  --   PHOS  --  3.1  --   --    GFR: Estimated Creatinine Clearance: 26.7 mL/min (A) (by C-G formula based on SCr of 2.68 mg/dL (H)). Liver Function Tests: Recent Labs  Lab 01/23/21 2146  AST 15  ALT 7  ALKPHOS 55  BILITOT 0.8  PROT 9.0*  ALBUMIN 2.7*   No results for input(s): LIPASE, AMYLASE in the last 168 hours. No results for input(s): AMMONIA in the last 168 hours. Coagulation Profile: No results for input(s): INR, PROTIME in the last 168  hours. Cardiac Enzymes: No results for input(s): CKTOTAL, CKMB, CKMBINDEX, TROPONINI in the last 168 hours. BNP (last 3 results) No results for input(s): PROBNP in the last 8760 hours. HbA1C: No results for input(s): HGBA1C in the last 72 hours. CBG: No results for input(s): GLUCAP in the last 168 hours. Lipid Profile: No results for input(s): CHOL, HDL, LDLCALC, TRIG, CHOLHDL, LDLDIRECT in the last 72 hours. Thyroid Function Tests: Recent Labs    01/25/21 0448  TSH 3.432   Anemia Panel: No results for input(s): VITAMINB12, FOLATE, FERRITIN, TIBC, IRON, RETICCTPCT in the last 72 hours. Sepsis Labs: No results for input(s): PROCALCITON, LATICACIDVEN in the last 168 hours.  Recent Results (from the past 240 hour(s))  Resp Panel by RT-PCR (Flu A&B, Covid) Nasopharyngeal Swab     Status: None   Collection Time: 01/23/21 10:20 PM   Specimen: Nasopharyngeal Swab; Nasopharyngeal(NP) swabs in vial transport medium  Result Value Ref Range Status   SARS Coronavirus 2 by RT  PCR NEGATIVE NEGATIVE Final    Comment: (NOTE) SARS-CoV-2 target nucleic acids are NOT DETECTED.  The SARS-CoV-2 RNA is generally detectable in upper respiratory specimens during the acute phase of infection. The lowest concentration of SARS-CoV-2 viral copies this assay can detect is 138 copies/mL. A negative result does not preclude SARS-Cov-2 infection and should not be used as the sole basis for treatment or other patient management decisions. A negative result may occur with  improper specimen collection/handling, submission of specimen other than nasopharyngeal swab, presence of viral mutation(s) within the areas targeted by this assay, and inadequate number of viral copies(<138 copies/mL). A negative result must be combined with clinical observations, patient history, and epidemiological information. The expected result is Negative.  Fact Sheet for Patients:   EntrepreneurPulse.com.au  Fact Sheet for Healthcare Providers:  IncredibleEmployment.be  This test is no t yet approved or cleared by the [place] FDA and  has been authorized for detection and/or diagnosis of SARS-CoV-2 by FDA under an Emergency Use Authorization (EUA). This EUA will remain  in effect (meaning this test can be used) for the duration of the COVID-19 declaration under Section 564(b)(1) of the Act, 21 U.S.C.section 360bbb-3(b)(1), unless the authorization is terminated  or revoked sooner.       Influenza A by PCR NEGATIVE NEGATIVE Final   Influenza B by PCR NEGATIVE NEGATIVE Final    Comment: (NOTE) The Xpert Xpress SARS-CoV-2/FLU/RSV plus assay is intended as an aid in the diagnosis of influenza from Nasopharyngeal swab specimens and should not be used as a sole basis for treatment. Nasal washings and aspirates are unacceptable for Xpert Xpress SARS-CoV-2/FLU/RSV testing.  Fact Sheet for Patients: EntrepreneurPulse.com.au  Fact Sheet for Healthcare Providers: IncredibleEmployment.be  This test is not yet approved or cleared by the [place] FDA and has been authorized for detection and/or diagnosis of SARS-CoV-2 by FDA under an Emergency Use Authorization (EUA). This EUA will remain in effect (meaning this test can be used) for the duration of the COVID-19 declaration under Section 564(b)(1) of the Act, 21 U.S.C. section 360bbb-3(b)(1), unless the authorization is terminated or revoked.  Performed at [facility], [place] [address] [place], [place] [address]          Radiology Studies: No results found.      Scheduled Meds: . Chlorhexidine Gluconate Cloth  6 each Topical Daily  . folic acid  1 mg Oral Daily  . furosemide  80 mg Intravenous BID  . heparin  5,000 Units Subcutaneous Q8H  . hydrALAZINE  10 mg Oral Q12H  . isosorbide dinitrate  10 mg  Oral BID  . multivitamin with minerals  1 tablet Oral Daily  . sodium chloride flush  3 mL Intravenous Q12H  . thiamine  100 mg Oral Daily   Or  . thiamine  100 mg Intravenous Daily   Continuous Infusions: . sodium chloride       LOS: 2 days    Time spent: 25 mins.More than 50% of that time was spent in counseling and/or coordination of care.      Shelly Coss, MD [facility] P5/30/2022, 1:31 PM

## 2021-01-26 NOTE — Progress Notes (Signed)
Physical Therapy Treatment Patient Details Name: [name] MRN: [id number] DOB: 1941-01-08 Today's Date: 01/26/2021    History of Present Illness Pt admitted through ED with c/o LE, abdominal and testicular swelling and dx with CHF exacerbation.  Pt with hx of PVD, demand ischemia, CHF, end stage cardiomegly, etoh abuse, Cirrhosis with recurrent ascites, chronic indwelling catheter, chonice LE wounds (being treated at wound center    PT Comments    Pt assisted with ambulating however only tolerated short distance.  Pt very pleasant and eager to mobilize.  Pt received call from son end of session.    Follow Up Recommendations  Home health PT     Equipment Recommendations  None recommended by PT    Recommendations for Other Services       Precautions / Restrictions Precautions Precautions: Fall;Other (comment) Precaution Comments: sheet wrapped to support genitals with gait Restrictions Weight Bearing Restrictions: No    Mobility  Bed Mobility               General bed mobility comments: pt in recliner    Transfers Overall transfer level: Needs assistance Equipment used: Rolling walker (2 wheeled) Transfers: Sit to/from Stand Sit to Stand: Min guard         General transfer comment: cues for use of UEs to self assist  Ambulation/Gait Ambulation/Gait assistance: Min guard Gait Distance (Feet): 40 Feet Assistive device: Rolling walker (2 wheeled) Gait Pattern/deviations: Trunk flexed;Wide base of support;Step-through pattern;Decreased stride length Gait velocity: decr   General Gait Details: short wide steps due to scrotal edema, SpO2 100% on room air, distance to tolerance   Stairs             Wheelchair Mobility    Modified Rankin (Stroke Patients Only)       Balance                                            Cognition Arousal/Alertness: Awake/alert Behavior During Therapy: WFL for tasks  assessed/performed Overall Cognitive Status: Within Functional Limits for tasks assessed                                        Exercises      General Comments        Pertinent Vitals/Pain Pain Assessment: 0-10 Pain Score: 4  Pain Location: edematous scrotum with movement Pain Descriptors / Indicators: Grimacing Pain Intervention(s): Repositioned;Monitored during session    Home Living                      Prior Function            PT Goals (current goals can now be found in the care plan section) Progress towards PT goals: Progressing toward goals    Frequency    Min 3X/week      PT Plan Current plan remains appropriate    Co-evaluation              AM-PAC PT "6 Clicks" Mobility   Outcome Measure  Help needed turning from your back to your side while in a flat bed without using bedrails?: A Little Help needed moving from lying on your back to sitting on the side of a flat bed without using bedrails?: A Little Help needed  moving to and from a bed to a chair (including a wheelchair)?: A Little Help needed standing up from a chair using your arms (e.g., wheelchair or bedside chair)?: A Little Help needed to walk in hospital room?: A Little Help needed climbing 3-5 steps with a railing? : A Lot 6 Click Score: 17    End of Session Equipment Utilized During Treatment: Gait belt Activity Tolerance: Patient limited by fatigue Patient left: in chair;with call bell/phone within reach;with chair alarm set   PT Visit Diagnosis: Muscle weakness (generalized) (M62.81);Difficulty in walking, not elsewhere classified (R26.2)     Time: 8891-6945 PT Time Calculation (min) (ACUTE ONLY): 12 min  Charges:  $Gait Training: 8-22 mins                     Arlyce Dice, DPT Acute Rehabilitation Services Pager: [phone] Office: [phone]   [name] 01/26/2021, 1:12 PM

## 2021-01-26 NOTE — Plan of Care (Signed)
  Problem: Education: Goal: Knowledge of General Education information will improve Description: Including pain rating scale, medication(s)/side effects and non-pharmacologic comfort measures Outcome: Progressing   Problem: Health Behavior/Discharge Planning: Goal: Ability to manage health-related needs will improve Outcome: Progressing   Problem: Clinical Measurements: Goal: Will remain free from infection Outcome: Progressing Goal: Cardiovascular complication will be avoided Outcome: Progressing

## 2021-01-26 NOTE — Progress Notes (Signed)
Daily Progress Note   Patient Name: [name]       Date: 01/26/2021 DOB: 01/19/1941  Age: 80 y.o. MRN#: [id number] Attending Physician: Shelly Coss, MD Primary Care Physician: [name], MD Admit Date: 01/23/2021  Reason for Consultation/Follow-up: Establishing goals of care  Subjective:  resting in chair, complains of discomfort from generalized swelling particularly scrotal swelling.  Length of Stay: 2  Current Medications: Scheduled Meds:  . Chlorhexidine Gluconate Cloth  6 each Topical Daily  . folic acid  1 mg Oral Daily  . furosemide  80 mg Intravenous BID  . heparin  5,000 Units Subcutaneous Q8H  . multivitamin with minerals  1 tablet Oral Daily  . sodium chloride flush  3 mL Intravenous Q12H  . thiamine  100 mg Oral Daily   Or  . thiamine  100 mg Intravenous Daily    Continuous Infusions: . sodium chloride      PRN Meds: sodium chloride, acetaminophen, LORazepam **OR** LORazepam, sodium chloride flush  Physical Exam         Sitting up in chair No distress Has scrotal edema has lower extremity peripheral edema essentially anasarca Regular work of breathing S1-S2  Vital Signs: BP 104/65 (BP Location: Left Arm)   Pulse 94   Temp 97.6 F (36.4 C)   Resp 18   Wt 91.4 kg   SpO2 100%   BMI 28.10 kg/m  SpO2: SpO2: 100 % O2 Device: O2 Device: Room Air O2 Flow Rate:    Intake/output summary:   Intake/Output Summary (Last 24 hours) at 01/26/2021 1121 Last data filed at 01/26/2021 0400 Gross per 24 hour  Intake 240 ml  Output 2600 ml  Net -2360 ml   LBM: Last BM Date: 01/25/21 Baseline Weight: Weight: 91.9 kg Most recent weight: Weight: 91.4 kg       Palliative Assessment/Data: Palliative performance scale 50%.     Patient  Active Problem List   Diagnosis Date Noted  . CHF exacerbation ([place]) 01/24/2021  . Volume overload 10/12/2020  . GERD without esophagitis 10/11/2020  . 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 Cirrhosis of liver with ascites ([place]) 10/11/2020  . Chronic systolic CHF (congestive heart failure) ([place])   . Symptomatic anemia   . Multiple gastric ulcers   . CKD (chronic kidney disease),  stage IV ([place]) 05/15/2020  . Chest pain 05/15/2020  . Palliative care encounter   . Acute on chronic congestive heart failure ([place]) 02/04/2019  . Anemia of chronic disease 03/08/2018  . Acute cholecystitis 11/23/2017  . Renal lesion 11/23/2017  . Alcohol abuse 02/26/2015  . Mixed hyperlipidemia   . Diabetes mellitus without complication ([place])   . Acute on chronic combined systolic and diastolic CHF (congestive heart failure) ([place])   . 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  Will be a candidate for peritoneal drain if he continues to require recurrent paracentesis.  Code Status:    Code Status Orders  (From admission, onward)         Start     Ordered   01/25/21 1032  Do not attempt resuscitation (DNR)  Continuous       Question Answer Comment  In the event of cardiac or respiratory ARREST Do not call a "code blue"   In the event of cardiac or respiratory ARREST Do not perform Intubation, CPR, defibrillation or ACLS    In the event of cardiac or respiratory ARREST Use medication by any route, position, wound care, and other measures to relive pain and suffering. May use oxygen, suction and manual treatment of airway obstruction as needed for comfort.      01/25/21 1031        Code Status History    Date Active Date Inactive Code Status Order ID Comments User Context   01/24/2021 0156 01/25/2021 1031 Full Code [id number]  Shela Leff, MD ED   10/11/2020 2023 10/15/2020 2106 Full Code [id number]  Vernelle Emerald, MD ED   05/15/2020 1332 05/22/2020 1946 Full Code [id number]  Harold Hedge, MD ED   06/23/2019 1236 07/09/2019 1623 DNR [id number]  Edwin Dada, MD Inpatient   06/19/2019 0141 06/23/2019 1236 Full Code [id number]  Jani Gravel, MD ED   02/04/2019 1216 02/12/2019 1720 Full Code [id number]  Kathi Ludwig, MD ED   11/23/2017 2118 11/26/2017 2039 Full Code [id number]  Vianne Bulls, MD ED   02/26/2015 0646 02/27/2015 1859 Full Code [id number]  Ivor Costa, MD Inpatient   Advance Care Planning Activity       Prognosis:   < 12 months is possible.   Discharge Planning:  Home with Palliative Services  Care plan was discussed with patient and TOC.   Thank you for allowing the Palliative Medicine Team to assist in the care of this patient.   Time In: 11 Time Out: 11.25 Total Time 25 min.  Prolonged Time Billed  no       Greater than 50%  of this time was spent counseling and coordinating care related to the above assessment and plan.  Loistine Chance, MD  Please contact Palliative Medicine Team phone at [phone] for questions and concerns.

## 2021-01-26 NOTE — Progress Notes (Signed)
[facility] [id number] [facility]) Hospital Liaison RN note  Notified by Marvel Plan with [facility] of patient/family request for [facility] Palliative services at home after discharge.  [facility] hospital liaison will follow for discharge disposition.  Please call with any hospice or outpatient palliative care related questions.  Thank you for the opportunity to participate in this patients care.  Margaretmary Eddy, BSN, RN [facility] Liaison [phone]

## 2021-01-26 NOTE — TOC Initial Note (Signed)
Transition of Care [facility]) - Initial/Assessment Note    Patient Details  Name: [name] MRN: [id number] Date of Birth: Jul 07, 1941  Transition of Care [facility]) CM/SW Contact:    Dessa Phi, RN Phone Number: 01/26/2021, 12:51 PM  Clinical Narrative:  Will confirm if active w/[facility] nsg; will need HHPT-checking if [facility] can accept.PCS-[facility] care rep Olivia Mackie following.                 Expected Discharge Plan: [place] Barriers to Discharge: Continued Medical Work up   Patient Goals and CMS Choice Patient states their goals for this hospitalization and ongoing recovery are:: go home CMS Medicare.gov Compare Post Acute Care list provided to:: Patient Choice offered to / list presented to : Patient  Expected Discharge Plan and Services Expected Discharge Plan: [place]   Discharge Planning Services: CM Consult   Living arrangements for the past 2 months: Single Family Home                                      Prior Living Arrangements/Services Living arrangements for the past 2 months: Single Family Home Lives with:: Siblings Patient language and need for interpreter reviewed:: No Do you feel safe going back to the place where you live?: Yes      Need for Family Participation in Patient Care: No (Comment) Care giver support system in place?: Yes (comment) Current home services: Home RN [facility] nsg) Criminal Activity/Legal Involvement Pertinent to Current Situation/Hospitalization: No - Comment as needed  Activities of Daily Living Home Assistive Devices/Equipment: Gilford Rile (specify type) ADL Screening (condition at time of admission) Patient's cognitive ability adequate to safely complete daily activities?: Yes Is the patient deaf or have difficulty hearing?: No Does the patient have difficulty seeing, even when wearing glasses/contacts?: Yes Does the patient have difficulty concentrating, remembering, or making decisions?:  No Patient able to express need for assistance with ADLs?: Yes Does the patient have difficulty dressing or bathing?: No Independently performs ADLs?: Yes (appropriate for developmental age) Does the patient have difficulty walking or climbing stairs?: Yes Weakness of Legs: Both Weakness of Arms/Hands: None  Permission Sought/Granted Permission sought to share information with : Case Manager Permission granted to share information with : Yes, Verbal Permission Granted  Share Information with NAME: Case manager           Emotional Assessment Appearance:: Appears stated age Attitude/Demeanor/Rapport: Gracious Affect (typically observed): Accepting Orientation: : Oriented to Self,Oriented to Place,Oriented to  Time,Oriented to Situation Alcohol / Substance Use: Not Applicable Psych Involvement: No (comment)  Admission diagnosis:  Anasarca [R60.1] CHF exacerbation ([place]) [I50.9] Acute on chronic systolic congestive heart failure (HCC) [I50.23] Ascites [R18.8] Hypervolemia, unspecified hypervolemia type [E87.70] Patient Active Problem List   Diagnosis Date Noted  . CHF exacerbation ([place]) 01/24/2021  . Volume overload 10/12/2020  . GERD without esophagitis 10/11/2020  . 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 Cirrhosis of liver with ascites ([place]) 10/11/2020  . Chronic systolic CHF (congestive heart failure) ([place])   . Symptomatic anemia   . Multiple gastric ulcers   . CKD (chronic kidney disease), stage IV ([place]) 05/15/2020  . Chest pain 05/15/2020  . Palliative care encounter   . Acute on chronic congestive heart failure ([place]) 02/04/2019  . Anemia of chronic disease 03/08/2018  . Acute cholecystitis 11/23/2017  . Renal lesion 11/23/2017  . Alcohol  abuse 02/26/2015  . Mixed hyperlipidemia   . Diabetes mellitus without complication ([place])   . Acute on chronic combined systolic and diastolic CHF (congestive heart failure) ([place])   . Essential hypertension     PCP:  [name], MD Pharmacy:   [facility] [address] [place]), [place] - [address] [place] ([place]) [place] [address] Phone: [phone] Fax: [phone]     Social Determinants of Health (SDOH) Interventions    Readmission Risk Interventions No flowsheet data found.

## 2021-01-26 NOTE — Care Management Important Message (Signed)
Medicare IM given to the patient by Rainna Nearhood. 

## 2021-01-27 ENCOUNTER — Inpatient Hospital Stay (HOSPITAL_COMMUNITY): Payer: Medicare Other

## 2021-01-27 DIAGNOSIS — I5021 Acute systolic (congestive) heart failure: Secondary | ICD-10-CM

## 2021-01-27 LAB — BASIC METABOLIC PANEL
Anion gap: 5 (ref 5–15)
Anion gap: 6 (ref 5–15)
BUN: 62 mg/dL — ABNORMAL HIGH (ref 8–23)
BUN: 60 mg/dL — ABNORMAL HIGH (ref 8–23)
CO2: 29 mmol/L (ref 22–32)
CO2: 32 mmol/L (ref 22–32)
Calcium: 8.1 mg/dL — ABNORMAL LOW (ref 8.9–10.3)
Calcium: 8.4 mg/dL — ABNORMAL LOW (ref 8.9–10.3)
Chloride: 105 mmol/L (ref 98–111)
Chloride: 104 mmol/L (ref 98–111)
Creatinine, Ser: 2.42 mg/dL — ABNORMAL HIGH (ref 0.61–1.24)
Creatinine, Ser: 2.7 mg/dL — ABNORMAL HIGH (ref 0.61–1.24)
GFR, Estimated: 27 mL/min — ABNORMAL LOW (ref >60)
GFR, Estimated: 24 mL/min — ABNORMAL LOW (ref >60)
Glucose, Bld: 100 mg/dL — ABNORMAL HIGH (ref 70–99)
Glucose, Bld: 142 mg/dL — ABNORMAL HIGH (ref 70–99)
Potassium: 4 mmol/L (ref 3.5–5.1)
Potassium: 3.8 mmol/L (ref 3.5–5.1)
Sodium: 139 mmol/L (ref 135–145)
Sodium: 142 mmol/L (ref 135–145)

## 2021-01-27 LAB — CBC WITH DIFFERENTIAL/PLATELET
Abs Immature Granulocytes: 0.01 10*3/uL (ref 0.00–0.07)
Basophils Absolute: 0 10*3/uL (ref 0.0–0.1)
Basophils Relative: 1 %
Eosinophils Absolute: 0.2 10*3/uL (ref 0.0–0.5)
Eosinophils Relative: 5 %
HCT: 23.5 % — ABNORMAL LOW (ref 39.0–52.0)
Hemoglobin: 7 g/dL — ABNORMAL LOW (ref 13.0–17.0)
Immature Granulocytes: 0 %
Lymphocytes Relative: 9 %
Lymphs Abs: 0.4 10*3/uL — ABNORMAL LOW (ref 0.7–4.0)
MCH: 26.4 pg (ref 26.0–34.0)
MCHC: 29.8 g/dL — ABNORMAL LOW (ref 30.0–36.0)
MCV: 88.7 fL (ref 80.0–100.0)
Monocytes Absolute: 0.5 10*3/uL (ref 0.1–1.0)
Monocytes Relative: 10 %
Neutro Abs: 3.6 10*3/uL (ref 1.7–7.7)
Neutrophils Relative %: 75 %
Platelets: 231 10*3/uL (ref 150–400)
RBC: 2.65 MIL/uL — ABNORMAL LOW (ref 4.22–5.81)
RDW: 19.2 % — ABNORMAL HIGH (ref 11.5–15.5)
WBC: 4.7 10*3/uL (ref 4.0–10.5)
nRBC: 0 % (ref 0.0–0.2)

## 2021-01-27 LAB — IRON AND TIBC
Iron: 51 ug/dL (ref 45–182)
Saturation Ratios: 18 % (ref 17.9–39.5)
TIBC: 291 ug/dL (ref 250–450)
UIBC: 240 ug/dL

## 2021-01-27 LAB — MAGNESIUM: Magnesium: 2.1 mg/dL (ref 1.7–2.4)

## 2021-01-27 LAB — PROTIME-INR
INR: 1.2 (ref 0.8–1.2)
Prothrombin Time: 15.3 seconds — ABNORMAL HIGH (ref 11.4–15.2)

## 2021-01-27 LAB — FERRITIN: Ferritin: 59 ng/mL (ref 24–336)

## 2021-01-27 MED ORDER — LIDOCAINE HCL 1 % IJ SOLN
INTRAMUSCULAR | Status: AC
  Filled 2021-01-27: qty 20

## 2021-01-27 NOTE — Progress Notes (Signed)
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  He was found to be severely volume overloaded on presentation and was started on IV Lasix.  Cardiology also consulted and following.  Assessment & Plan:   Principal Problem:   CHF exacerbation ([place]) Active Problems:   Diabetes mellitus without complication ([place])   Alcohol abuse   Essential hypertension   CKD (chronic kidney disease), stage IV (HCC)   Volume overload/severe systolic congestive heart failure: Secondary to acute on chronic systolic  heart failure/congestive cardiomyopathy.  Anasarca also contributed by liver cirrhosis/hypoalbuminemia.  Echocardiogram showed ejection fraction of 10-20% Cardiology following.  Started on IV diuretics.  Continue to monitor input/output, daily weight. Currently on hydralazine and Imdur.  Continue compression wraps for bilateral lower extremity edema  Ascites: History of liver cirrhosis from alcohol.  Might also have been contributed by congestive hepatopathy.  Status post paracentesis with removal of 7 L of fluid.  Has been getting  paracentesis once a month for the last 3 months.His abdomen looked distended  again. He will definitely need recurrent paracentesis in the future.  He also needs to follow up with GI as an outpatient.  Underwent another session of paracentesis today with removal of 4.3 L of fluid.  Left ventricular thrombus: Not a candidate for anticoagulation due to noncompliance, GI bleed  CKD stage IV: Currently kidney function at baseline.  Normocytic anemia: Hemoglobin is 7 today. Anemia is most likely associated with CKD.  No evidence of acute blood loss. he has history of GI bleed.  Normal iron as per  iron studies.  We will transfuse if hemoglobin drops less than 7.  Severe BPH/chronic bladder outlet obstruction: On chronic indwelling Foley catheter.  Changed monthly.  Chronic alcoholism: Reports quitting drinking 6 months ago, not sure if this is true.  No signs of withdrawal.  Continue monitoring for withdrawal.  Continue thiamine folic acid.  Chronic bilateral lower extremity wound: Follows at wound care center.  Debility/deconditioning/goals of care: Palliative care also following during this hospitalization and recommend palliative care follow-up as an outpatient when he is discharged.  PT/OT recommended home health on discharge.         DVT prophylaxis:Heparin [place] Code Status: DNR Family Communication: Called sister for update on 01/27/21 Status is: Inpatient  Remains inpatient appropriate because:Inpatient level of care appropriate due to severity of illness   Dispo: The patient is from: Home              Anticipated d/c is to: Home              Patient currently is not medically stable to d/c.   Difficult to place patient No     Consultants: Cardiology  Procedures:Paracentesis  Antimicrobials:  Anti-infectives (From admission, onward)   None      Subjective:  Patient seen and  examined the bedside this morning.  Hemodynamically stable.  On room air.  Denies any complaints of cough, shortness of breath or chest pain.  Abdomen looks distended.   Ordered another paracentesis this morning.  Still volume overloaded.  Objective: Vitals:   01/26/21 2119 01/26/21 2142 01/27/21 0500 01/27/21 0525  BP: (!) 100/59 (!) 102/58  100/60  Pulse: 98   (!) 101  Resp: 18   18  Temp: 98.1 F (36.7 C)   97.6 F (36.4 C)  TempSrc: Oral   Oral  SpO2: 99%   98%  Weight:   88.5 kg     Intake/Output Summary (Last 24 hours) at 01/27/2021 0811 Last data filed at 01/27/2021 0539 Gross per 24 hour  Intake 1080 ml  Output 4200 ml  Net -3120 ml   Filed Weights   01/25/21 0600 01/26/21 0500 01/27/21 0500  Weight: 91.9 kg 91.4 kg 88.5 kg    Examination:  General exam: Overall comfortable, not in distress, very deconditioned, debilitated HEENT: PERRL Respiratory system: Bilateral basal crackles Cardiovascular system: S1 & S2 heard, RRR.  Gastrointestinal system: Abdomen is distended, sciatic Central nervous system: Alert and oriented Extremities: 2-3+ bilateral lower extremity pitting edema, no clubbing ,no cyanosis Skin: No rashes, no ulcers,no icterus    Data Reviewed: I have personally reviewed following labs and imaging studies  CBC: Recent Labs  Lab 01/23/21 2146 01/27/21 0413  WBC 3.9* 4.7  NEUTROABS 3.2 3.6  HGB 8.5* 7.0*  HCT 28.6* 23.5*  MCV 88.8 88.7  PLT 267 878   Basic Metabolic Panel: Recent Labs  Lab 01/23/21 2146 01/24/21 0500 01/25/21 0448 01/26/21 0523 01/27/21 0413  NA 138  --  138 138 139  K 4.3  --  4.2 3.7 4.0  CL 107  --  107 105 105  CO2 20*  --  26 26 29   GLUCOSE 104*  --  112* 134* 100*  BUN 66*  --  69* 66* 62*  CREATININE 2.67*  --  2.70* 2.68* 2.42*  CALCIUM 8.7*  --  8.2* 8.2* 8.1*  MG  --  1.9 2.0  --   --   PHOS  --  3.1  --   --   --    GFR: Estimated Creatinine Clearance: 27.2 mL/min (A) (by C-G formula based on SCr of 2.42 mg/dL (H)). Liver Function Tests: Recent Labs  Lab 01/23/21 2146  AST 15  ALT 7  ALKPHOS 55  BILITOT 0.8  PROT 9.0*  ALBUMIN 2.7*   No results for  input(s): LIPASE, AMYLASE in the last 168 hours. No results for input(s): AMMONIA in the last 168 hours. Coagulation Profile: No results for input(s): INR, PROTIME in the last 168 hours. Cardiac Enzymes: No results for input(s): CKTOTAL, CKMB, CKMBINDEX, TROPONINI in the last 168 hours. BNP (last 3 results) No results for input(s): PROBNP in the last 8760 hours. HbA1C: No results for input(s): HGBA1C in the last 72 hours. CBG: No results for input(s): GLUCAP in the last 168 hours. Lipid Profile: No results for input(s): CHOL, HDL, LDLCALC, TRIG, CHOLHDL, LDLDIRECT in the last 72 hours. Thyroid Function Tests: Recent Labs    01/25/21 0448  TSH 3.432   Anemia Panel: No results for input(s): VITAMINB12, FOLATE, FERRITIN, TIBC, IRON, RETICCTPCT in the last 72 hours. Sepsis Labs: No results for input(s): PROCALCITON, LATICACIDVEN in the last 168 hours.  Recent Results (from the past 240 hour(s))  Resp Panel by RT-PCR (Flu A&B, Covid) Nasopharyngeal Swab  Status: None   Collection Time: 01/23/21 10:20 PM   Specimen: Nasopharyngeal Swab; Nasopharyngeal(NP) swabs in vial transport medium  Result Value Ref Range Status   SARS Coronavirus 2 by RT PCR NEGATIVE NEGATIVE Final    Comment: (NOTE) SARS-CoV-2 target nucleic acids are NOT DETECTED.  The SARS-CoV-2 RNA is generally detectable in upper respiratory specimens during the acute phase of infection. The lowest concentration of SARS-CoV-2 viral copies this assay can detect is 138 copies/mL. A negative result does not preclude SARS-Cov-2 infection and should not be used as the sole basis for treatment or other patient management decisions. A negative result may occur with  improper specimen collection/handling, submission of specimen other than nasopharyngeal swab, presence of viral mutation(s) within the areas targeted by this assay, and inadequate number of viral copies(<138 copies/mL). A negative result must be combined  with clinical observations, patient history, and epidemiological information. The expected result is Negative.  Fact Sheet for Patients:  EntrepreneurPulse.com.au  Fact Sheet for Healthcare Providers:  IncredibleEmployment.be  This test is no t yet approved or cleared by the [place] FDA and  has been authorized for detection and/or diagnosis of SARS-CoV-2 by FDA under an Emergency Use Authorization (EUA). This EUA will remain  in effect (meaning this test can be used) for the duration of the COVID-19 declaration under Section 564(b)(1) of the Act, 21 U.S.C.section 360bbb-3(b)(1), unless the authorization is terminated  or revoked sooner.       Influenza A by PCR NEGATIVE NEGATIVE Final   Influenza B by PCR NEGATIVE NEGATIVE Final    Comment: (NOTE) The Xpert Xpress SARS-CoV-2/FLU/RSV plus assay is intended as an aid in the diagnosis of influenza from Nasopharyngeal swab specimens and should not be used as a sole basis for treatment. Nasal washings and aspirates are unacceptable for Xpert Xpress SARS-CoV-2/FLU/RSV testing.  Fact Sheet for Patients: EntrepreneurPulse.com.au  Fact Sheet for Healthcare Providers: IncredibleEmployment.be  This test is not yet approved or cleared by the [place] FDA and has been authorized for detection and/or diagnosis of SARS-CoV-2 by FDA under an Emergency Use Authorization (EUA). This EUA will remain in effect (meaning this test can be used) for the duration of the COVID-19 declaration under Section 564(b)(1) of the Act, 21 U.S.C. section 360bbb-3(b)(1), unless the authorization is terminated or revoked.  Performed at [facility], [place] [address], [place], [place] [address]          Radiology Studies: No results found.      Scheduled Meds: . Chlorhexidine Gluconate Cloth  6 each Topical Daily  . folic acid  1 mg Oral Daily  .  furosemide  80 mg Intravenous BID  . heparin  5,000 Units Subcutaneous Q8H  . hydrALAZINE  10 mg Oral Q12H  . isosorbide dinitrate  10 mg Oral BID  . multivitamin with minerals  1 tablet Oral Daily  . sodium chloride flush  3 mL Intravenous Q12H  . thiamine  100 mg Oral Daily   Or  . thiamine  100 mg Intravenous Daily   Continuous Infusions: . sodium chloride       LOS: 3 days    Time spent: 25 mins.More than 50% of that time was spent in counseling and/or coordination of care.      Shelly Coss, MD [facility] P5/31/2022, 8:11 AM

## 2021-01-27 NOTE — TOC Progression Note (Signed)
Transition of Care [facility]) - Progression Note    Patient Details  Name: [name] MRN: [id number] Date of Birth: 1941/08/28  Transition of Care [facility]) CM/SW Contact  David Towson, Juliann Pulse, RN Phone Number: 01/27/2021, 1:22 PM  Clinical Narrative:  Confirmed Active w/[facility] nsg-Lower leg wound care-rep Malachy Mood following;PT recc HHPT-add to [facility] orders.[facility] care following for Palliative Care Service @ d/c.     Expected Discharge Plan: [place] Barriers to Discharge: Continued Medical Work up  Expected Discharge Plan and Services Expected Discharge Plan: [place]   Discharge Planning Services: CM Consult   Living arrangements for the past 2 months: Single Family Home                                       Social Determinants of Health (SDOH) Interventions    Readmission Risk Interventions No flowsheet data found.

## 2021-01-27 NOTE — Progress Notes (Addendum)
Progress Note  Patient Name: [name] Date of Encounter: 01/27/2021  [facility] Cardiologist: Candee Furbish, MD   Subjective   Patient states he is feeling much improved today, endorses less SOB, is waiting for a paracentesis today. He states he ran out of his diuretics before this admission. He had swelling from his legs all the way up to his abdomen. He felt swelling is going down with Lasix and he is urinating a lot. He states he had quit drinking ETOH 1 yr ago.   Inpatient Medications    Scheduled Meds: . Chlorhexidine Gluconate Cloth  6 each Topical Daily  . folic acid  1 mg Oral Daily  . furosemide  80 mg Intravenous BID  . heparin  5,000 Units Subcutaneous Q8H  . hydrALAZINE  10 mg Oral Q12H  . isosorbide dinitrate  10 mg Oral BID  . multivitamin with minerals  1 tablet Oral Daily  . sodium chloride flush  3 mL Intravenous Q12H  . thiamine  100 mg Oral Daily   Or  . thiamine  100 mg Intravenous Daily   Continuous Infusions: . sodium chloride     PRN Meds: sodium chloride, acetaminophen, sodium chloride flush   Vital Signs    Vitals:   01/26/21 2119 01/26/21 2142 01/27/21 0500 01/27/21 0525  BP: (!) 100/59 (!) 102/58  100/60  Pulse: 98   (!) 101  Resp: 18   18  Temp: 98.1 F (36.7 C)   97.6 F (36.4 C)  TempSrc: Oral   Oral  SpO2: 99%   98%  Weight:   88.5 kg     Intake/Output Summary (Last 24 hours) at 01/27/2021 1026 Last data filed at 01/27/2021 0539 Gross per 24 hour  Intake 720 ml  Output 4200 ml  Net -3480 ml   Last 3 Weights 01/27/2021 01/26/2021 01/25/2021  Weight (lbs) 195 lb 1.7 oz 201 lb 8 oz 202 lb 9.6 oz  Weight (kg) 88.5 kg 91.4 kg 91.9 kg  Some encounter information is confidential and restricted. Go to Review Flowsheets activity to see all data.      Telemetry    Sinus rhythm with PVCs- Personally Reviewed  ECG    No new tracing today - Personally Reviewed  Physical Exam   GEN: No acute distress.Icteric sclera  Neck:  JVD up to upper neck while sitting at 90 degree  Cardiac: RRR, + murmur.  Respiratory: Clear to auscultation bilaterally. On room air  GI: Large abdomen, ascitic wave noted  MS: Anasarca with pitting edema of bilateral feet up to scrotum  Neuro:  Nonfocal  Psych: Normal affect   Labs    High Sensitivity Troponin:   Recent Labs  Lab 01/23/21 2146 01/24/21 0047  TROPONINIHS 41* 42*      Chemistry Recent Labs  Lab 01/23/21 2146 01/25/21 0448 01/26/21 0523 01/27/21 0413  NA 138 138 138 139  K 4.3 4.2 3.7 4.0  CL 107 107 105 105  CO2 20* 26 26 29   GLUCOSE 104* 112* 134* 100*  BUN 66* 69* 66* 62*  CREATININE 2.67* 2.70* 2.68* 2.42*  CALCIUM 8.7* 8.2* 8.2* 8.1*  PROT 9.0*  --   --   --   ALBUMIN 2.7*  --   --   --   AST 15  --   --   --   ALT 7  --   --   --   ALKPHOS 55  --   --   --  BILITOT 0.8  --   --   --   GFRNONAA 24* 24* 24* 27*  ANIONGAP 11 5 7 5      Hematology Recent Labs  Lab 01/23/21 2146 01/27/21 0413  WBC 3.9* 4.7  RBC 3.22* 2.65*  HGB 8.5* 7.0*  HCT 28.6* 23.5*  MCV 88.8 88.7  MCH 26.4 26.4  MCHC 29.7* 29.8*  RDW 18.8* 19.2*  PLT 267 231    BNP Recent Labs  Lab 01/23/21 2146  BNP 2,820.0*     DDimer No results for input(s): DDIMER in the last 168 hours.   Radiology    No results found.  Cardiac Studies   Echo from 10/14/20:  1. No left ventricular thrombus with Definity contrast. Left ventricular  ejection fraction, by estimation, is <20%. The left ventricle has severely  decreased function. The left ventricle demonstrates global hypokinesis.  The left ventricular internal cavity size was severely dilated.  2. Right ventricular systolic function is moderately reduced. The right  ventricular size is severely enlarged. There is moderately elevated  pulmonary artery systolic pressure. The estimated right ventricular  systolic pressure is 20.2 mmHg.  3. Left atrial size was severely dilated.  4. Right atrial size was  severely dilated.  5. The mitral valve is normal in structure. Mild mitral valve  regurgitation.  6. The aortic valve is tricuspid. Aortic valve regurgitation is not  visualized. Mild aortic valve sclerosis is present, with no evidence of  aortic valve stenosis.   Comparison(s): Prior images reviewed side by side. No thrombus is seen in  the left ventricle with Definity contrast, on the current images.   Patient Profile     80 y.o. 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  Patient presented with dyspnea, anasarca in the setting of running out of torsemide.  Cardiology was consulted and following for decompensated heart failure.   Assessment & Plan    Acute on chronic systolic and diastolic biventricular heart failure End-stage dilated cardiomyopathy - History of dilated cardiomyopathy since 2013, had recurrent hospitalization for decompensated heart failure in the past, was seen by advanced heart failure at one-point, has history of medication noncompliance and failure to follow-up - He was on Coreg 3.125mg  BID and torsemide 40 mg daily at home, ran out of torsemide before this admission, historically unable use ACEi/ARB/ARNI/SGLT2i/MRA due to advanced kidney disease  - Echo from 10/14/20 EF <20%, similar to 05/2019 Echo  - no ischemic workup in the past, presumed due to ETOH abuse  - Net -7730 ml and weight down from 202 >195 pounds since admission  - continue IV lasix 80mg  BID, monitor daily weight and I&O - GDMT: Coreg held in the setting of volume overload, started hydralazine and isordil, tolerating, may resume Coreg when volume status improving, unable use ACEi/ARB/ARNI/SGLT2i/MRA due to advanced kidney disease  -  continue thiamine and folic acid long term   Alcoholic cirrhosis with recurrent ascites History of EtOH abuse Hx of recurrent GI bleed  - patient reports stopped ETOH use for 1 yr - LFTs and bilirubin WNL POA - will check INR today  - no clinical evidence of GI bleed, hepatic encephalopathy and SBP today  - abdomen imaging from 2020 showing cirrhosis morphology  - consider diagnostic and therapeutic paracentesis rule out hepatocellular  carcinoma, recurrent ascites may require outpatient arrangement for periodic paracentesis  - discussed absolute ETOH cessation   ? AKI on CKD stage IV Cardiorenal syndrome - Cr 2.67 POA, has been >2 over the past year, unclear if this is near baseline  - Cr downtrend to 2.42 today with diuresis, anasarca improving - continue diuresis as above trend BMP daily    History of LV thrombus on Echo 2020 -Historically takes Coumadin, this was stopped due to recurrent GI bleed, given high risk of bleeding and resolved thrombus on Echo 09/2020, no recommendation for therapeutic anticoagulation at this time   Hypertension - BP low normal, tolerating low dose hydralazine and isordil and lasix   Type 2 diabetes BPH Anemia of chronic disease  - management per primary team   For questions or updates, please contact [facility] Please consult [email or web address] for contact info under   Signed, Margie Billet, NP  01/27/2021, 10:26 AM    History and all data above reviewed.  Patient examined.  I agree with the findings as above.  He reports that he is feeling better.  I do see the palliative care note.   The patient exam reveals COR:RRR  ,  Lungs: Decreased breath sounds at the bases  ,  Abd: Positive bowel sounds, no rebound no guarding, Ext severe diffuse edema to the waste.   .  All available labs, radiology testing, previous records reviewed. Agree with documented assessment and plan.   Acute on chronic systolic and diastolic HF:  Agree with continued diuresis and  planned paracentesis.  Thankfully his creat is improving allowing [place] to continue diuresis.    Really nothing to add to this unfortunate situation.  Goals of care are appropriate.      Jeneen Rinks Choua Ikner  11:52 AM  01/27/2021

## 2021-01-27 NOTE — Procedures (Signed)
Ultrasound-guided  therapeutic paracentesis performed yielding 4.3 liters of milky/cream colored  fluid. No immediate complications.EBL none.

## 2021-01-28 DIAGNOSIS — I5023 Acute on chronic systolic (congestive) heart failure: Secondary | ICD-10-CM | POA: Diagnosis not present

## 2021-01-28 LAB — CBC WITH DIFFERENTIAL/PLATELET
Abs Immature Granulocytes: 0.02 10*3/uL (ref 0.00–0.07)
Basophils Absolute: 0 10*3/uL (ref 0.0–0.1)
Basophils Relative: 1 %
Eosinophils Absolute: 0.2 10*3/uL (ref 0.0–0.5)
Eosinophils Relative: 4 %
HCT: 26.2 % — ABNORMAL LOW (ref 39.0–52.0)
Hemoglobin: 7.8 g/dL — ABNORMAL LOW (ref 13.0–17.0)
Immature Granulocytes: 0 %
Lymphocytes Relative: 7 %
Lymphs Abs: 0.4 10*3/uL — ABNORMAL LOW (ref 0.7–4.0)
MCH: 26.5 pg (ref 26.0–34.0)
MCHC: 29.8 g/dL — ABNORMAL LOW (ref 30.0–36.0)
MCV: 89.1 fL (ref 80.0–100.0)
Monocytes Absolute: 0.5 10*3/uL (ref 0.1–1.0)
Monocytes Relative: 9 %
Neutro Abs: 4 10*3/uL (ref 1.7–7.7)
Neutrophils Relative %: 79 %
Platelets: 264 10*3/uL (ref 150–400)
RBC: 2.94 MIL/uL — ABNORMAL LOW (ref 4.22–5.81)
RDW: 19.2 % — ABNORMAL HIGH (ref 11.5–15.5)
WBC: 5.1 10*3/uL (ref 4.0–10.5)
nRBC: 0 % (ref 0.0–0.2)

## 2021-01-28 LAB — BASIC METABOLIC PANEL
Anion gap: 8 (ref 5–15)
BUN: 56 mg/dL — ABNORMAL HIGH (ref 8–23)
CO2: 27 mmol/L (ref 22–32)
Calcium: 8 mg/dL — ABNORMAL LOW (ref 8.9–10.3)
Chloride: 101 mmol/L (ref 98–111)
Creatinine, Ser: 2.37 mg/dL — ABNORMAL HIGH (ref 0.61–1.24)
GFR, Estimated: 28 mL/min — ABNORMAL LOW (ref >60)
Glucose, Bld: 89 mg/dL (ref 70–99)
Potassium: 4 mmol/L (ref 3.5–5.1)
Sodium: 136 mmol/L (ref 135–145)

## 2021-01-28 MED ORDER — SIMETHICONE 80 MG PO CHEW
80.0000 mg | CHEWABLE_TABLET | Freq: Once | ORAL | Status: AC
  Administered 2021-01-28: 80 mg via ORAL
  Filled 2021-01-28: qty 1

## 2021-01-28 MED ORDER — FUROSEMIDE 10 MG/ML IJ SOLN
40.0000 mg | Freq: Two times a day (BID) | INTRAMUSCULAR | Status: DC
  Administered 2021-01-28 – 2021-01-29 (×3): 40 mg via INTRAVENOUS
  Filled 2021-01-28 (×3): qty 4

## 2021-01-28 NOTE — Progress Notes (Signed)
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  He was found to be severely volume overloaded on presentation and was started on IV Lasix.  Cardiology also consulted and following.  Assessment & Plan:   Principal Problem:   CHF exacerbation ([place]) Active Problems:   Diabetes mellitus without complication ([place])   Alcohol abuse   Essential hypertension   CKD (chronic kidney disease), stage IV ([facility])   Volume overload/severe systolic congestive heart failure: Secondary to acute on chronic systolic  heart failure/congestive cardiomyopathy.  Anasarca also contributed by liver cirrhosis/hypoalbuminemia.  Echocardiogram showed ejection fraction of 10-20% Cardiology following.  Started on IV diuretics.  Continue to monitor input/output, daily weight. Currently on hydralazine and Imdur.  Continue compression wraps for bilateral lower extremity edema  Ascites: History of liver cirrhosis from alcohol.  Might also have been contributed by congestive hepatopathy.  Status post paracentesis with removal of 7 L of fluid.  Has been getting  paracentesis once a month for the last 3 months.His abdomen looked distended  again. He will definitely need recurrent paracentesis in the future.  He also needs to follow up with GI as an outpatient.  Underwent another session of paracentesis on 5/31 with removal of 4.3 L of fluid. We will reach out to gastroenterology to arrange a follow-up appointment as an outpatient so that he can be referred for recurrent  paracentesis in the future.  Left ventricular thrombus: Not a candidate for anticoagulation due to noncompliance, GI bleed  CKD stage IV: Currently kidney function at baseline and is improving with diuresis  Normocytic anemia: Hemoglobin is in the range of 7 today. Anemia is most likely associated with CKD.  No evidence of acute blood loss. he has history of GI bleed.  Normal iron as per  iron studies.  We will transfuse if hemoglobin drops less than 7.  Severe BPH/chronic bladder outlet obstruction: On chronic indwelling Foley catheter.  Changed monthly.  Chronic alcoholism: Reports quitting drinking 6 months ago, not sure if this is true.  No signs of withdrawal.  Continue monitoring for withdrawal.  Continue thiamine, folic acid.  Chronic bilateral lower extremity wound: Follows at wound care center.  Wound care consulted here  Debility/deconditioning/goals of care: Palliative care also following during this hospitalization and recommend palliative care follow-up as an outpatient when he is discharged.  PT/OT recommended home health on discharge.         DVT prophylaxis:Heparin [place] Code Status: DNR Family Communication: Called sister for update on 01/27/21 Status is: Inpatient  Remains inpatient appropriate because:Inpatient level of care appropriate due to severity of illness   Dispo: The patient is from: Home              Anticipated d/c is to: Home              Patient  currently is not medically stable to d/c.   Difficult to place patient No     Consultants: Cardiology  Procedures:Paracentesis  Antimicrobials:  Anti-infectives (From  admission, onward)   None      Subjective:  Patient seen and examined the bedside this morning.  Hemodynamically stable.  Feels better.  Denies any complaints.  On room air.  He still looks volume overloaded  Objective: Vitals:   01/27/21 1224 01/27/21 2116 01/27/21 2256 01/28/21 0534  BP: (!) 114/55 102/62 (!) 99/49 98/60  Pulse: 94 93  94  Resp: 20 20  20   Temp: 97.6 F (36.4 C) 98.3 F (36.8 C)  98 F (36.7 C)  TempSrc: Oral Oral  Oral  SpO2: 100% 99%    Weight:        Intake/Output Summary (Last 24 hours) at 01/28/2021 0755 Last data filed at 01/28/2021 0536 Gross per 24 hour  Intake 660 ml  Output 2200 ml  Net -1540 ml   Filed Weights   01/25/21 0600 01/26/21 0500 01/27/21 0500  Weight: 91.9 kg 91.4 kg 88.5 kg    Examination:    General exam: Very deconditioned, debilitated, chronically ill looking Respiratory system: Bilateral diminished air sounds in the bases, bilateral basal crackles cardiovascular system: S1 & S2 heard, RRR. No JVD, murmurs, rubs, gallops or clicks. Gastrointestinal system: Abdomen is nondistended, soft and nontender. No organomegaly or masses felt. Normal bowel sounds heard. Central nervous system: Alert and oriented. No focal neurological deficits. Extremities: 1-2 bilateral lower extremity pitting edema, no clubbing ,no cyanosis Skin: Ulcers on bilateral lower extremities  wrapped with dressings   Data Reviewed: I have personally reviewed following labs and imaging studies  CBC: Recent Labs  Lab 01/23/21 2146 01/27/21 0413 01/28/21 0441  WBC 3.9* 4.7 5.1  NEUTROABS 3.2 3.6 4.0  HGB 8.5* 7.0* 7.8*  HCT 28.6* 23.5* 26.2*  MCV 88.8 88.7 89.1  PLT 267 231 034   Basic Metabolic Panel: Recent Labs  Lab 01/24/21 0500 01/25/21 0448 01/26/21 0523 01/27/21 0413 01/27/21 2317 01/28/21 0441  NA  --  138 138 139 142 136  K  --  4.2 3.7 4.0 3.8 4.0  CL  --  107 105 105 104 101  CO2  --  26 26 29  32 27  GLUCOSE  --  112* 134* 100*  142* 89  BUN  --  69* 66* 62* 60* 56*  CREATININE  --  2.70* 2.68* 2.42* 2.70* 2.37*  CALCIUM  --  8.2* 8.2* 8.1* 8.4* 8.0*  MG 1.9 2.0  --   --  2.1  --   PHOS 3.1  --   --   --   --   --    GFR: Estimated Creatinine Clearance: 27.8 mL/min (A) (by C-G formula based on SCr of 2.37 mg/dL (H)). Liver Function Tests: Recent Labs  Lab 01/23/21 2146  AST 15  ALT 7  ALKPHOS 55  BILITOT 0.8  PROT 9.0*  ALBUMIN 2.7*   No results for input(s): LIPASE, AMYLASE in the last 168 hours. No results for input(s): AMMONIA in the last 168 hours. Coagulation Profile: Recent Labs  Lab 01/27/21 1627  INR 1.2   Cardiac Enzymes: No results for input(s): CKTOTAL, CKMB, CKMBINDEX, TROPONINI in the last 168 hours. BNP (last 3 results) No results for input(s): PROBNP in the last 8760 hours. HbA1C: No results for input(s): HGBA1C in the last 72 hours. CBG: No results for input(s): GLUCAP in the last 168 hours. Lipid Profile:  No results for input(s): CHOL, HDL, LDLCALC, TRIG, CHOLHDL, LDLDIRECT in the last 72 hours. Thyroid Function Tests: No results for input(s): TSH, T4TOTAL, FREET4, T3FREE, THYROIDAB in the last 72 hours. Anemia Panel: Recent Labs    01/27/21 0917  FERRITIN 59  TIBC 291  IRON 51   Sepsis Labs: No results for input(s): PROCALCITON, LATICACIDVEN in the last 168 hours.  Recent Results (from the past 240 hour(s))  Resp Panel by RT-PCR (Flu A&B, Covid) Nasopharyngeal Swab     Status: None   Collection Time: 01/23/21 10:20 PM   Specimen: Nasopharyngeal Swab; Nasopharyngeal(NP) swabs in vial transport medium  Result Value Ref Range Status   SARS Coronavirus 2 by RT PCR NEGATIVE NEGATIVE Final    Comment: (NOTE) SARS-CoV-2 target nucleic acids are NOT DETECTED.  The SARS-CoV-2 RNA is generally detectable in upper respiratory specimens during the acute phase of infection. The lowest concentration of SARS-CoV-2 viral copies this assay can detect is 138 copies/mL. A  negative result does not preclude SARS-Cov-2 infection and should not be used as the sole basis for treatment or other patient management decisions. A negative result may occur with  improper specimen collection/handling, submission of specimen other than nasopharyngeal swab, presence of viral mutation(s) within the areas targeted by this assay, and inadequate number of viral copies(<138 copies/mL). A negative result must be combined with clinical observations, patient history, and epidemiological information. The expected result is Negative.  Fact Sheet for Patients:  EntrepreneurPulse.com.au  Fact Sheet for Healthcare Providers:  IncredibleEmployment.be  This test is no t yet approved or cleared by the [place] FDA and  has been authorized for detection and/or diagnosis of SARS-CoV-2 by FDA under an Emergency Use Authorization (EUA). This EUA will remain  in effect (meaning this test can be used) for the duration of the COVID-19 declaration under Section 564(b)(1) of the Act, 21 U.S.C.section 360bbb-3(b)(1), unless the authorization is terminated  or revoked sooner.       Influenza A by PCR NEGATIVE NEGATIVE Final   Influenza B by PCR NEGATIVE NEGATIVE Final    Comment: (NOTE) The Xpert Xpress SARS-CoV-2/FLU/RSV plus assay is intended as an aid in the diagnosis of influenza from Nasopharyngeal swab specimens and should not be used as a sole basis for treatment. Nasal washings and aspirates are unacceptable for Xpert Xpress SARS-CoV-2/FLU/RSV testing.  Fact Sheet for Patients: EntrepreneurPulse.com.au  Fact Sheet for Healthcare Providers: IncredibleEmployment.be  This test is not yet approved or cleared by the [place] FDA and has been authorized for detection and/or diagnosis of SARS-CoV-2 by FDA under an Emergency Use Authorization (EUA). This EUA will remain in effect (meaning this test can  be used) for the duration of the COVID-19 declaration under Section 564(b)(1) of the Act, 21 U.S.C. section 360bbb-3(b)(1), unless the authorization is terminated or revoked.  Performed at [name], [place] [address], [place], [place] [address]          Radiology Studies: US Paracentesis  Result Date: 01/27/2021 INDICATION: Patient with history of alcoholic cirrhosis, CHF, end-stage cardiomyopathy, chronic kidney disease, recurrent ascites. Request received for therapeutic paracentesis. EXAM: ULTRASOUND GUIDED THERAPEUTIC PARACENTESIS MEDICATIONS: 1% lidocaine to skin and subcutaneous tissue COMPLICATIONS: None immediate. PROCEDURE: Informed written consent was obtained from the patient after a discussion of the risks, benefits and alternatives to treatment. A timeout was performed prior to the initiation of the procedure. Initial ultrasound scanning demonstrates a large amount of ascites within the right lower abdominal quadrant. The right lower  abdomen was prepped and draped in the usual sterile fashion. 1% lidocaine was used for local anesthesia. Following this, a 19 gauge, 10-cm, Yueh catheter was introduced. An ultrasound image was saved for documentation purposes. The paracentesis was performed. The catheter was removed and a dressing was applied. The patient tolerated the procedure well without immediate post procedural complication. FINDINGS: A total of approximately 4.3 liters of milky/cream colored fluid was removed. IMPRESSION: Successful ultrasound-guided therapeutic paracentesis yielding 4.3 liters of peritoneal fluid. Read by: Rowe Robert, PA-C Electronically Signed   By: [name].  Shick M.D.   On: 01/27/2021 12:11        Scheduled Meds: . Chlorhexidine Gluconate Cloth  6 each Topical Daily  . folic acid  1 mg Oral Daily  . furosemide  80 mg Intravenous BID  . heparin  5,000 Units Subcutaneous Q8H  . hydrALAZINE  10 mg Oral Q12H  . isosorbide dinitrate  10  mg Oral BID  . multivitamin with minerals  1 tablet Oral Daily  . sodium chloride flush  3 mL Intravenous Q12H  . thiamine  100 mg Oral Daily   Or  . thiamine  100 mg Intravenous Daily   Continuous Infusions: . sodium chloride       LOS: 4 days    Time spent: 25 mins.More than 50% of that time was spent in counseling and/or coordination of care.      Shelly Coss, MD [facility] P6/08/2020, 7:55 AM

## 2021-01-28 NOTE — Consult Note (Addendum)
[facility] Nurse Consult Note: Reason for Consult: LE wounds Patient followed in the wound care center since Nov. 2021 Wound type: venous stasis  Left medial lower leg; pretibial  Left 1st toe, reports to have wound per patient, not visible  Pressure Injury POA: NA Measurement: Left pretibial 0.3cm x 0.3cm x 0.2cm Wound bed: Left pretibial; hypergranulation tissue 100% Drainage (amount, consistency, odor) not able to assess no dressings in place  Periwound: wrinkled dry skin  Dressing procedure/placement/frequency: Bedside nursing staff to remove Profore compression wraps and dressings weekly, prior to [facility] nurse coming to assess and reapply.   1. Clean wounds with saline as tolerated, pat dry  2. Apply silver hydrofiber to all open areas  3. Top with foam dressings  4. Apply Profore (light) 3 layer compression   Per wound care center plan of care currently in place  WOC to see 2x week for topical care and change of Profore compression wraps. Plans to see Monday if still inpatient, explained rationale for Monday visit.  Patient to contact [facility] upon DC to resume services. Patient to follow up with Dr. Dellia Nims as scheduled.   [place], [place], [place]

## 2021-01-28 NOTE — Progress Notes (Addendum)
Progress Note  Patient Name: [name] Date of Encounter: 01/28/2021  [facility] Cardiologist: Candee Furbish, MD   Subjective   Patient states he is feeling much improved, he had 4.3 L ascitic fluid taken out yesterday. He reports improved SOB, denied any chest pain. He is in good spirit, listening to music today. He felt his leg and scrotum edema are much less as well.   Inpatient Medications    Scheduled Meds: . Chlorhexidine Gluconate Cloth  6 each Topical Daily  . folic acid  1 mg Oral Daily  . furosemide  40 mg Intravenous BID  . heparin  5,000 Units Subcutaneous Q8H  . hydrALAZINE  10 mg Oral Q12H  . isosorbide dinitrate  10 mg Oral BID  . multivitamin with minerals  1 tablet Oral Daily  . sodium chloride flush  3 mL Intravenous Q12H  . thiamine  100 mg Oral Daily   Or  . thiamine  100 mg Intravenous Daily   Continuous Infusions: . sodium chloride     PRN Meds: sodium chloride, acetaminophen, sodium chloride flush   Vital Signs    Vitals:   01/27/21 1224 01/27/21 2116 01/27/21 2256 01/28/21 0534  BP: (!) 114/55 102/62 (!) 99/49 98/60  Pulse: 94 93  94  Resp: 20 20  20   Temp: 97.6 F (36.4 C) 98.3 F (36.8 C)  98 F (36.7 C)  TempSrc: Oral Oral  Oral  SpO2: 100% 99%    Weight:        Intake/Output Summary (Last 24 hours) at 01/28/2021 1100 Last data filed at 01/28/2021 0536 Gross per 24 hour  Intake 660 ml  Output 2200 ml  Net -1540 ml   Last 3 Weights 01/27/2021 01/26/2021 01/25/2021  Weight (lbs) 195 lb 1.7 oz 201 lb 8 oz 202 lb 9.6 oz  Weight (kg) 88.5 kg 91.4 kg 91.9 kg  Some encounter information is confidential and restricted. Go to Review Flowsheets activity to see all data.      Telemetry    Sinus rhythm with tachycardia 100s with occasional PVCs- Personally Reviewed  ECG    No new tracing today - Personally Reviewed  Physical Exam   GEN: No acute distress. Mild Icteric sclera  Neck: JVD up to mid-neck while sitting at 90 degree   Cardiac: RRR, + murmur LSB Respiratory: Clear to auscultation bilaterally. On room air  GI: Decreased distention of firm abdomen  MS: Anasarca with pitting edema of bilateral feet up to scrotum, much improved, ACE wrap of BLE   Neuro:  Nonfocal  Psych: Normal affect   Labs    High Sensitivity Troponin:   Recent Labs  Lab 01/23/21 2146 01/24/21 0047  TROPONINIHS 41* 42*      Chemistry Recent Labs  Lab 01/23/21 2146 01/25/21 0448 01/27/21 0413 01/27/21 2317 01/28/21 0441  NA 138   < > 139 142 136  K 4.3   < > 4.0 3.8 4.0  CL 107   < > 105 104 101  CO2 20*   < > 29 32 27  GLUCOSE 104*   < > 100* 142* 89  BUN 66*   < > 62* 60* 56*  CREATININE 2.67*   < > 2.42* 2.70* 2.37*  CALCIUM 8.7*   < > 8.1* 8.4* 8.0*  PROT 9.0*  --   --   --   --   ALBUMIN 2.7*  --   --   --   --   AST 15  --   --   --   --  ALT 7  --   --   --   --   ALKPHOS 55  --   --   --   --   BILITOT 0.8  --   --   --   --   GFRNONAA 24*   < > 27* 24* 28*  ANIONGAP 11   < > 5 6 8    < > = values in this interval not displayed.     Hematology Recent Labs  Lab 01/23/21 2146 01/27/21 0413 01/28/21 0441  WBC 3.9* 4.7 5.1  RBC 3.22* 2.65* 2.94*  HGB 8.5* 7.0* 7.8*  HCT 28.6* 23.5* 26.2*  MCV 88.8 88.7 89.1  MCH 26.4 26.4 26.5  MCHC 29.7* 29.8* 29.8*  RDW 18.8* 19.2* 19.2*  PLT 267 231 264    BNP Recent Labs  Lab 01/23/21 2146  BNP 2,820.0*     DDimer No results for input(s): DDIMER in the last 168 hours.   Radiology    US Paracentesis  Result Date: 01/27/2021 INDICATION: Patient with history of alcoholic cirrhosis, CHF, end-stage cardiomyopathy, chronic kidney disease, recurrent ascites. Request received for therapeutic paracentesis. EXAM: ULTRASOUND GUIDED THERAPEUTIC PARACENTESIS MEDICATIONS: 1% lidocaine to skin and subcutaneous tissue COMPLICATIONS: None immediate. PROCEDURE: Informed written consent was obtained from the patient after a discussion of the risks, benefits and  alternatives to treatment. A timeout was performed prior to the initiation of the procedure. Initial ultrasound scanning demonstrates a large amount of ascites within the right lower abdominal quadrant. The right lower abdomen was prepped and draped in the usual sterile fashion. 1% lidocaine was used for local anesthesia. Following this, a 19 gauge, 10-cm, Yueh catheter was introduced. An ultrasound image was saved for documentation purposes. The paracentesis was performed. The catheter was removed and a dressing was applied. The patient tolerated the procedure well without immediate post procedural complication. FINDINGS: A total of approximately 4.3 liters of milky/cream colored fluid was removed. IMPRESSION: Successful ultrasound-guided therapeutic paracentesis yielding 4.3 liters of peritoneal fluid. Read by: Rowe Robert, PA-C Electronically Signed   By: [name].  Shick M.D.   On: 01/27/2021 12:11    Cardiac Studies   Echo from 10/14/20:  1. No left ventricular thrombus with Definity contrast. Left ventricular  ejection fraction, by estimation, is <20%. The left ventricle has severely  decreased function. The left ventricle demonstrates global hypokinesis.  The left ventricular internal cavity size was severely dilated.  2. Right ventricular systolic function is moderately reduced. The right  ventricular size is severely enlarged. There is moderately elevated  pulmonary artery systolic pressure. The estimated right ventricular  systolic pressure is 46.2 mmHg.  3. Left atrial size was severely dilated.  4. Right atrial size was severely dilated.  5. The mitral valve is normal in structure. Mild mitral valve  regurgitation.  6. The aortic valve is tricuspid. Aortic valve regurgitation is not  visualized. Mild aortic valve sclerosis is present, with no evidence of  aortic valve stenosis.   Comparison(s): Prior images reviewed side by side. No thrombus is seen in  the left ventricle with  Definity contrast, on the current images.   Patient Profile     80 y.o. 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  Patient presented with dyspnea, anasarca in the setting of running  out of torsemide.  Cardiology was consulted and following for decompensated heart failure.   Assessment & Plan    Acute on chronic systolic and diastolic biventricular heart failure End-stage dilated cardiomyopathy - History of dilated cardiomyopathy since 2013, had recurrent hospitalization for decompensated heart failure in the past, was seen by advanced heart failure at [facility], has history of medication noncompliance and failure to follow-up - He was on Coreg 3.125mg  BID and torsemide 40 mg daily at home, ran out of torsemide before this admission, historically unable use ACEi/ARB/ARNI/SGLT2i/MRA due to advanced kidney disease  - Echo from 10/14/20 EF <20%, similar to 05/2019 Echo  - no ischemic workup in the past, presumed due to ETOH abuse  - Net -8.4 L and weight down from 202 >195 pounds since admission  - will reduce IV lasix from 80mg  to 40mg  BID today given low BP, monitor daily weight and I&O - GDMT: Coreg held in the setting of volume overload/low BP, started hydralazine 10mg  BID and isordil 10mg  BID,  tolerating, may resume Coreg when volume status improving/BP tolerates, unable use ACEi/ARB/ARNI/SGLT2i/MRA due to advanced kidney disease  - continue thiamine and folic acid long term - goals of care per primary team/palliative care    Alcoholic cirrhosis with recurrent ascites History of EtOH abuse Hx of recurrent GI bleed  - patient reports stopped ETOH use for 1 yr - LFTs and bilirubin WNL POA, INR 1.2 - no clinical evidence of  GI bleed, hepatic encephalopathy and SBP, clinically compensated cirrhosis  - abdomen imaging from 2020 showing cirrhosis morphology  - s/p paracentesis by IR on 01/27/21 with 4.3 L milky/cream colored fluid , no ascitic fluid analysis /cytology sent, consider evaluation of [place] and outpatient arrangement of routine paracentesis  - discussed absolute ETOH cessation   ? AKI on CKD stage IV Cardiorenal syndrome - Cr 2.67 POA, has been >2 over the past year, unclear if this is near baseline  - Cr downtrend to 2.37 today with diuresis, anasarca improving, monitor hepatorenal syndrome post paracentesis  - continue diuresis as above trend BMP daily    History of LV thrombus on Echo 2020 -Historically took Coumadin, this was stopped due to recurrent GI bleed, given high risk of bleeding and resolved thrombus on Echo 09/2020, no recommendation for therapeutic anticoagulation at this time   Hypertension - BP low normal, tolerating low dose hydralazine and isordil and lasix   Type 2 diabetes BPH Anemia of chronic disease  - management per primary team   For questions or updates, please contact [facility] Please consult [email or web address] for contact info under   Signed, Margie Billet, NP  01/28/2021, 11:00 AM    History and all data above reviewed.  Patient examined.  I agree with the findings as above.  No chest pain.  No SOB.   Feels much better status post paracentesis.  The patient exam reveals COR:RRR  ,  Lungs: Decreased breath sounds slightly at the bases  ,  Abd: Less tense and distended, Ext moderate edema improved.   .  All available labs, radiology testing, previous records reviewed. Agree with documented assessment and plan.   Anasarca:  Creat is coming down and he continues to diurese.  Reduce Lasix as above with excellent output and falling BP.   Meds as listed.    Jeneen Rinks Traycen Goyer  2:31 PM  01/28/2021

## 2021-01-28 NOTE — Progress Notes (Signed)
[name], [name] ([id number]) Visit Report for 01/22/2021 Chief Complaint Document Details Patient Name: Date of Service: [place] 01/22/2021 8:00 A M Medical Record Number: [id number] Patient Account Number: [id number] Date of Birth/Sex: Treating RN: [birth year] (80 y.o. [name] Primary Care Provider: [name]., [name] Other Clinician: Referring Provider: Treating Provider/Extender: Mack Hook., [name] in Treatment: 55 Information Obtained from: Patient Chief Complaint 10/11/2019; patient is here for review of wounds on his bilateral lower extremities and left foot Electronic Signature(s) Signed: 01/22/2021 8:58:57 AM By: Kalman Shan DO Entered By: Kalman Shan on 01/22/2021 08:55:08 -------------------------------------------------------------------------------- HPI Details Patient Name: Date of Service: [place] 01/22/2021 8:00 A M Medical Record Number: [id number] Patient Account Number: [id number] Date of Birth/Sex: Treating RN: [birth year] (80 y.o. [name], [name] Primary Care Provider: [name]., [name] Other Clinician: Referring Provider: Treating Provider/Extender: Mack Hook., [name] in Treatment: 54 History of Present Illness HPI Description: ADMISSION 10/11/2019 This is a 80 year old man with a history of a severe cardiomyopathy with an ejection fraction of about 20%, chronic renal failure stage III. He is listed as a type II diabetic in epic although the patient denies this. He also has a history of PVD. He states for the last month he has had wounds on his bilateral lower extremities that started off as blisters which denuded. He has areas on the left lateral calf and 2 on the right lateral. He has an area on the left first met head which he did not know was there he we identified this on intake. He has been using Silvadene cream provided by his primary care physician but he  is complaining that this burns. 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 He was acutely ill at that time complicating GI bleed. ABIs in our clinic were 1.16 on the right and 1.13 on the left 2/25; the patient comes in with his areas on the left lateral and right lateral calf. There is also an area over the left first MTP bunion deformity. We have been using Sorbact. His edema control is fairly good 3/4; left lateral and right lateral calf. Most of his wounds are in the same position tightly adherent nonviable debris. On the right we debrided the superior wound on the left both wounds. The area on his bunion over the left first MTP medially is I think just about closed. We have been using Sorbact without a lot of success changed to Iodoflex under compression 3/11; left lateral and right lateral calf perhaps minor improvement in the surface condition. We have been using Iodoflex. The area over the bunion of the left first MTP has closed over 3/18; left lateral and right lateral calf not much improvement. We have been using Iodoflex. Aggressive debridement last week. 3/25; left lateral and right lateral calf. We have been using Iodoflex. There is some improvement in the superior area on the right and 2 on the lateral left although there is still a lot of debris on the surface. The inferior area on the right is still a completely nonviable surface. 4/8; left lateral  and right lateral calfs. Essentially mirror-image looking wounds 2 wounds on each side in close juxtaposition we have been using Iodoflex with some improvement in the very adherent  fibrinous debris but not a lot. The patient has arterial studies next Wednesday morning and venous studies next Thursday morning. I have been avoiding any further aggressive debridement until we see the arterial study results. His ABIs were fairly good in this clinic and is dorsalis pedis pulses are palpable but the wound beds are pale. 4/15; left lateral and right lateral calfs. Essentially mirror-image wounds with 2 wounds on each side in close juxtaposition but with rims of separating normal tissue. We have been using Iodoflex. The base of the wounds has been cleaning up quite nicely ARTERIAL STUDIES were done showing the patient had an ABI on the right of 1.27 with triphasic waveforms and a TBI of 0.90 on the left triphasic waveforms with an ABI of 1.28 and a TBI of 0.87. No evidence of arterial disease VENOUS REFLUX STUDIES; were done yesterday we do not have this report yet. 4/23; VENOUS REFLUX STUDIES did not show any significant reflux right lower extremity. No evidence of a DVT He did have significant reflux in the common . femoral vein on the left but nothing else was listed as significant. He did not have a DVT. We have been using Iodoflex under compression his wounds are making progress. 4/29; improvements in the wound surface continue. We have been using Iodoflex. He has a 20% out-of-pocket co-pay for Apligraf which is unfortunate. We changed him to Sorbact today 5/6; started on Sorbact last week. Better looking wound surface but not much change in dimensions. 01/24/20-Patient is back at 3 weeks, wound surfaces are about the same but very minimal slough on the right leg wounds, however there is blistering on both legs adjacent to the wounds, patient denies any other symptoms or new symptoms. His studies have been reviewed and do not indicate any significant arterial or venous disease. But he is attending once in 3 weeks with home health changing in between 6/3; patient has 4 wounds 2  on each side of his lateral lower legs. These wounds are somewhat improved. He apparently arrived in clinic last week with a large blister on the right lateral lower leg that had denuded into the wound. They changed to silver alginate. Still under compression. He has straight Medicare and unfortunately has an unlimited co-pay for advanced treatment options. 6/10; his original 4 wounds are all better especially on the left lateral lower leg. Areas on the right medial have a better looking surface were using silver collagen The blister on the right lateral lower leg from last week has an open area however this looks fairly healthy were using silver alginate on this He comes in today having "stubbed" his left second toe he has an abrasion at the base of the nailbed I think he probably caught the nail which is mycotic. 6/24; blister on the right lateral lower leg has healed. There is no additional wounds. The traumatic left second toe is also closed. The 4 that he has are all better 7/1; all of the patient's wounds are smaller except for the proximal one on the left lateral calf. We are using silver collagen 7/22; patient has home health. Relatively refractory wounds on the lateral part of both mid calfs. Each of them 2 wounds. The areas on the left are doing much better in fact the distal 1 looks like it is on his way  to closure. The right required debridement with adherent fibrinous debris under illumination. We have been using silver collagen. Previous arterial studies were within normal limits he also had venous reflux studies 04/03/2020 upon evaluation today patient appears to be doing excellent with regard to his lower extremity ulcers. He is going to require a little bit of debridement on the wounds on the right especially in order to clear away some biofilm and slough but this is minimal and overall he seems to be doing quite well. 8/19; the patient's wounds on the left lateral lower leg just above  closed. However the 2 on the lateral part of the right have a nonviable necrotic surface. This is disappointing. There is also no change in dimensions. He has had arterial studies as well as venous reflux studies 8/26; very disappointing today. Even the more superficial areas on the left are about the same as last week. Still 2 punched-out areas completely unchanged on the right. We have been using silver collagen really making no progress. 9/2; changed to Iodoflex last week better looking wound surfaces especially on the right although they are deep and punched out. The areas on the left are more superficial open 1 of these is almost fully epithelialized although it has been this way for at least 2 weeks. He has a 20% co-pay [Medicare only] unaffordable for a skin substitute. Had some thought about a snap VAC on the deep areas on the right leg we will try to put this through in the insurance 9/9; punched-out wounds on the bilateral lateral lower extremities. We have been operating as if the these were chronic venous wounds with secondary lymphedema. The areas on the left have been doing well in fact one of them is closed over. We have had no improvement in the areas on the right we have been using Iodoflex to help with surface debridement. The areas on the right are deeper wider. I do not see any evidence of infection. Home health is not been putting the wraps on high enough he has localized lymphedema right above the wounds. He has had arterial and venous studies already 9/16; punched-out mirror-image wounds on his bilateral lateral mid calfs. We have 1 closed on the left lateral the other smaller. For the first time today the areas on the right lateral actually looks some better. I did a biopsy of 1 of these last time although we still do not have this result. HOWEVER he arrives in clinic today complaining of chest pain overnight which felt like a brick on his chest. He also had 2 black watery bowel  movements. He does not have nausea or vomiting. He is not describing abdominal pain. He has no prior history of diarrhea heartburn etc. 9/30; sent this patient to the ER on 9/16. 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 He is on twice daily PPIs Protonix. His admission hemoglobin was exceptionally low at 5. 8.. Discharged at 8.1. 4 unit transfusion. 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 His diuretic dose was actually decreased because of elevated creatinine currently at torsemide 20 mg not felt to be a Coumadin candidate. Patient states he feels well. The areas on his left leg are healed. The areas on the right look better. These have been very refractory wounds. He does not have stockings we will make arrangements for this today 10/7; the areas on the left leg  are healed we will transition him into his 20/30 stocking today. Continued improvement in the areas on the right lateral leg 10/21; the areas on the left leg remain healed at this 2-week follow-up however there is a history that he developed some drainage from one of the wound sites with home health therefore going ahead and wrapping him. He was supposed to be on 20/30 stockings although the history here is vague and I am not sure that this represents a stocking failure or not. We have been wrapping his right leg with the one remaining wound is the distal wound. We have been using silver collagen 10/28 the patient only has 1 wound remaining on the right lower calf. This still has 3 mm of depth which is unchanged but the surface area is smaller. The superior wound on the right lateral is closed. The 2 wounds on the left remain closed and he is using his compression stocking 11/4 1 remaining wound on the right lower calf. Under illumination not a viable surface we have been using silver collagen we had good effect on the other areas but  this 1 appears to be stalled. There is no open area on the left leg he is using his own stocking. 11/11; 1 remaining wound on the right lower calf. Surface of this is a lot better than last week but still requiring debridement I am using Iodoflex but looking forward to changing the primary dressing to perhaps endoform 11/18; 1 remaining wound on the right lower leg, venous insufficiency, vigorous debridement last week/Iodoflex. Depth of the wound is therefore larger but the wound looks clean. Still very gritty material at the surface requiring debridement 12/2; the remaining wound on his right lower leg, venous insufficiency. I changed him to endoform 2 weeks ago. We appear to be making nice progress. Wound is measuring smaller 12/16; he comes in today with one of his two wounds on the left opened infected second area on the left may be threatened as well as the healed one on the right. He has edema in both legs which I think is pitting. He says his weights are stable he has unknown fairly severe cardiomyopathy. His cardiologist is Dr. Brigitte Pulse it does not sound like he is really been carefully followed 12/23; the areas that I was concerned about on the left look a lot better we put some silver alginate and put him back in compression. It may be the compression that actually does the trick here. The remaining open area we have been using Iodoflex and again this looks a lot better. At our suggestion he went to see his primary who did not adjust his diuretic he is still apparently taking 20 mg of a loop diuretic and "sometimes" 40 mg is basically what the patient stated 1/6; the area on the left is closed once again. We will transition him into his 20/30 stocking from elastic therapy both areas on the right are now open which is a deterioration from last time at which time the only 1 was open but they are very superficial. We have been using Hydrofera Blue under compression 1/27; its been 3 weeks since the  patient was here. We transitioned him to a 20/30 stocking on the left leg. Still under 4-layer compression on the right. He comes in today with a new small opening on the left leg but reopening on the right. Massive blistering on the right leg. He has severe systemic fluid volume overload. I have looked over the  patient's records. He was seen in 2020 by tele health by cardiology. 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 I think he was supposed to have follow-up but that follow-up never seems to have happened. He does not complain of chest pain or shortness of breath but notes increasing edema not just in his lower legs 2/3; he has new open areas on the bilateral anterior lower legs. I think a lot of this is because of systemic fluid volume overload. The ultrasound I ordered showed ascites and suggestion of cirrhosis. His physical exam continues to suggest right greater than left congestive heart failure. I have him taking 40 mg of Lasix oral daily I have asked him to start weighing himself daily. With the intense efforts of our nursing staff we have him a cardiology appointment on Monday or Tuesday of next week I have told him not to miss this. With regards to his wounds I do not think we are going to be able to stop wrapping him until somebody manages to get his fluid volume down 2/10; the areas on his bilateral lateral lower legs actually look better today. He has been taking Lasix 40 mg a day I believe [2 x 20 mg]. He seems to have less edema in his legs and and is a result of this I think he has less edema in his lower legs as well and the wounds appear to be doing better. He comes in today with purulence coming out of the toenail bed of the left great toe it appears that his toenail came off there was noted to be pus I did a culture of this area 2/24; since the patient was last here 2 weeks ago he was admitted to hospital from 2/12 through  2/16 predominantly acute on chronic systolic heart failure. He also has stage IV chronic kidney disease discharge creatinine at 2.71 and on top of this morphology of the liver on ultrasound suggested cirrhosis of unclear etiology. He had 9.4 L of ascites tapped I am unclear whether this was sent for analysis at the time of this dictation. He is currently taking 40 mg of Demadex a day I am a bit surprised that was so low. Nevertheless the wounds on his legs are a lot better everything is closed on the right he still has an open area on the left and then the area on the great toe nail matrix on the left from last time. 10/30/2020 upon evaluation today patient appears to be doing well with regard to his legs other than the fact that they are extremely swollen. He has multiple blisters noted bilateral lower extremities. Apparently the home health nurse came out yesterday unwrapped them and noticed a couple blisters she called 911 and would not rewrap him. There is no evidence of infection whatsoever we did not receive a phone call that we are aware of anyway regarding the fact that the home health nurse had any concerns at all. Nonetheless the major issue here is that the patient is going to really need to make sure that his fluids under good control but I do not see anything that indicates he needs to go to the hospital right now. There is literally no evidence of infection although fluid is just completely clear. 3/16; patient has home health. Apparently the last time he was here home health did not put compression wraps back on his legs after discovering blisters. By the time he got into  see [place] he had had several additional blisters and wounds open up. He was put back in 4 layer compression most of this appears to be healing. He still has small superficial areas on his bilateral lower extremities He also has an area in the left great toe nailbed after lost his toe nail. 3/31; 2-week follow-up. Everything  on the right leg is healed he has only has a very small open area remaining on the left leg in one of his original wound sites this should be healed by the next time he is here we have good edema control bilaterally. He removed his toenail last time he was here. Drainage from this we are using topical antibiotics Finally he has increasing ascites which I think is probably cirrhosis rather than heart failure. He said he was going to go to the emergency room. I suggested that he go to his primary doctor get the primary doctor updated in where this is. If they are going to do paracentesis then that may be able to be done in interventional radiology rather than sitting for hours in the emergency room. He does not look to be in any distress. 4/14; patient returns to clinic. The stockings he had did not maintain skin integrity on the right he has 3 open wounds here which started on blisters. Predictably everything is closed on the left. He tells me that since he was last here he had to go to the emergency room to deal with the ascites they tapped 8 L of fluid. Again if anybody is aware of the exact etiology of this with certainty the patient is not aware of this. All I know is the ultrasound I did suggested cirrhosis. 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 5/5; patient returns to clinic. Apparently was healed up by home health he did not have his wrap and did not have his stocking and blisters on his leg almost immediately occurred. He has a large open wound on the left anterior leg small area on the left great toe and 2 areas on the right lateral leg which are better than last time 5/12; patient says his overall weight is up 10 pounds. Some of this obviously ascites however he has clear signs of also systemic fluid volume excess. Sees his primary doctor tomorrow at 3:00. He has a large wound on the left medial lower leg extending posteriorly  this was the tremendous blister a smaller area laterally 2 areas on the right leg and an area on his left first toe. He has home health 5/26; patient presents for 2-week follow-up. He states he is feeling well. He has been using silver alginate under 4-layer compression and has had this changed with home health. He has no complaints today. He denies any signs of infection. Electronic Signature(s) Signed: 01/22/2021 8:58:57 AM By: Kalman Shan DO Entered By: Kalman Shan on 01/22/2021 08:55:50 -------------------------------------------------------------------------------- Physical Exam Details Patient Name: Date of Service: [name] 01/22/2021 8:00 A M Medical Record Number: [id number] Patient Account Number: [id number] Date of Birth/Sex: Treating RN: 1941-07-16 (80 y.o. [name], [name] Primary Care Provider: [name]., [name] Other Clinician: Referring Provider: Treating Provider/Extender: Mack Hook., [name] in Treatment: 11 Constitutional respirations regular, non-labored and within target range for patient.Marland Kitchen Psychiatric pleasant and cooperative. Notes Bilateral lower extremity with open wounds with granulation tissue present. No signs of infection. 2+  pitting edema bilaterally to the thighs. Electronic Signature(s) Signed: 01/22/2021 8:58:57 AM By: Kalman Shan DO Entered By: Kalman Shan on 01/22/2021 08:57:10 -------------------------------------------------------------------------------- Physician Orders Details Patient Name: Date of Service: [place] 01/22/2021 8:00 A M Medical Record Number: [id number] Patient Account Number: [id number] Date of Birth/Sex: Treating RN: December 08, 1940 (80 y.o. 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 - Thursday with Dr. Dellia Nims Return A Bathing/ Shower/ Hygiene May shower with protection but do not get wound dressing(s) wet. May shower and wash wound with soap and water. - with dressing changes only. Edema Control - Lymphedema / SCD / Other Elevate legs to the level of the heart or above for 30 minutes daily and/or when sitting, a frequency of: - 3-4 times a day throughout the day. Avoid standing for long periods of time. Exercise regularly Moisturize legs daily. - right leg every night. Compression stocking or Garment 20-30 mm/Hg pressure to: - ****if wounds close patient and home health to apply compression stocking to the leg.*** Home Health No change in wound care orders this week; continue Home Health for wound care. May utilize formulary equivalent dressing for wound treatment orders unless otherwise specified. - [facility] home health twice a week. ****if wounds close on a leg patient and home health to please apply compression stocking to the leg. patient cannot go without compression to legs.*** Please cover any blisters with calcium alginate Ag. Wound Treatment Wound #15 - T Great oe Wound Laterality: Left Cleanser: Soap and Water (Home Health) 2 x Per Day/15 Days Discharge Instructions: May shower and wash wound with dial antibacterial soap and water prior to dressing change. Topical: Mupirocin Ointment 2 x Per Day/15 Days Discharge Instructions: ***In Clinic  only***. Apply Mupirocin (Bactroban) under Alignate Ag. Topical: Polysporin (Home Health) 2 x Per Day/15 Days Discharge Instructions: patient to purchase and home health to apply under the alginate Ag, Prim Dressing: KerraCel Ag Gelling Fiber Dressing, 2x2 in (silver alginate) ([place]) 2 x Per Day/15 Days ary Discharge Instructions: Apply silver alginate to wound bed as instructed Secondary Dressing: Woven Gauze Sponges 2x2 in ([place]) 2 x Per Day/15 Days Discharge Instructions: Apply over primary dressing as directed. Secured With: Child psychotherapist, Sterile 2x75 (in/in) (Home Health) 2 x Per Day/15 Days Discharge Instructions: Secure with stretch gauze as directed. Wound #16R - Lower Leg Wound Laterality: Right, Lateral Cleanser: Soap and Water (Home Health) 2 x Per Week/15 Days Discharge Instructions: May shower and wash wound with dial antibacterial soap and water prior to dressing change. Cleanser: Wound Cleanser (Home Health) 2 x Per Week/15 Days Discharge Instructions: Cleanse the wound with wound cleanser prior to applying a clean dressing using gauze sponges, not tissue or cotton balls. Peri-Wound Care: Sween Lotion (Moisturizing lotion) (Home Health) 2 x Per Week/15 Days Discharge Instructions: Apply moisturizing lotion as directed Prim Dressing: KerraCel Ag Gelling Fiber Dressing, 4x5 in (silver alginate) (Home Health)  2 x Per Week/15 Days ary Discharge Instructions: Apply silver alginate to wound bed as instructed Secondary Dressing: Woven Gauze Sponge, Non-Sterile 4x4 in (Home Health) 2 x Per Week/15 Days Discharge Instructions: Apply over primary dressing as directed. Secondary Dressing: ABD Pad, 5x9 (Home Health) 2 x Per Week/15 Days Discharge Instructions: Apply over primary dressing as directed. Compression Wrap: FourPress (4 layer compression wrap) (Home Health) 2 x Per Week/15 Days Discharge Instructions: Apply four layer compression as  directed. Wound #17 - Lower Leg Wound Laterality: Right, Lateral, Proximal Cleanser: Soap and Water (Home Health) 2 x Per Week/15 Days Discharge Instructions: May shower and wash wound with dial antibacterial soap and water prior to dressing change. Cleanser: Wound Cleanser (Home Health) 2 x Per Week/15 Days Discharge Instructions: Cleanse the wound with wound cleanser prior to applying a clean dressing using gauze sponges, not tissue or cotton balls. Peri-Wound Care: Sween Lotion (Moisturizing lotion) (Home Health) 2 x Per Week/15 Days Discharge Instructions: Apply moisturizing lotion as directed Prim Dressing: KerraCel Ag Gelling Fiber Dressing, 4x5 in (silver alginate) (Home Health) 2 x Per Week/15 Days ary Discharge Instructions: Apply silver alginate to wound bed as instructed Secondary Dressing: Woven Gauze Sponge, Non-Sterile 4x4 in (Home Health) 2 x Per Week/15 Days Discharge Instructions: Apply over primary dressing as directed. Secondary Dressing: ABD Pad, 5x9 (Home Health) 2 x Per Week/15 Days Discharge Instructions: Apply over primary dressing as directed. Compression Wrap: FourPress (4 layer compression wrap) (Home Health) 2 x Per Week/15 Days Discharge Instructions: Apply four layer compression as directed. Wound #19 - Lower Leg Wound Laterality: Left, Posterior Cleanser: Soap and Water (Home Health) 2 x Per Week/15 Days Discharge Instructions: May shower and wash wound with dial antibacterial soap and water prior to dressing change. Cleanser: Wound Cleanser (Home Health) 2 x Per Week/15 Days Discharge Instructions: Cleanse the wound with wound cleanser prior to applying a clean dressing using gauze sponges, not tissue or cotton balls. Peri-Wound Care: Zinc Oxide Ointment 30g tube (Home Health) 2 x Per Week/15 Days Discharge Instructions: Apply Zinc Oxide to periwound with each dressing change Peri-Wound Care: Sween Lotion (Moisturizing lotion) (Home Health) 2 x Per Week/15  Days Discharge Instructions: Apply moisturizing lotion as directed Prim Dressing: KerraCel Ag Gelling Fiber Dressing, 4x5 in (silver alginate) (Home Health) 2 x Per Week/15 Days ary Discharge Instructions: Apply silver alginate to wound bed as instructed Secondary Dressing: Woven Gauze Sponge, Non-Sterile 4x4 in (Home Health) 2 x Per Week/15 Days Discharge Instructions: Apply over primary dressing as directed. Secondary Dressing: ABD Pad, 5x9 (Home Health) 2 x Per Week/15 Days Discharge Instructions: Apply over primary dressing as directed. Compression Wrap: FourPress (4 layer compression wrap) (Home Health) 2 x Per Week/15 Days Discharge Instructions: Apply four layer compression as directed. Wound #20 - Lower Leg Wound Laterality: Left, Lateral Cleanser: Soap and Water (Home Health) 2 x Per Week/15 Days Discharge Instructions: May shower and wash wound with dial antibacterial soap and water prior to dressing change. Cleanser: Wound Cleanser (Home Health) 2 x Per Week/15 Days Discharge Instructions: Cleanse the wound with wound cleanser prior to applying a clean dressing using gauze sponges, not tissue or cotton balls. Peri-Wound Care: Zinc Oxide Ointment 30g tube (Home Health) 2 x Per Week/15 Days Discharge Instructions: Apply Zinc Oxide to periwound with each dressing change Peri-Wound Care: Sween Lotion (Moisturizing lotion) (Home Health) 2 x Per Week/15 Days Discharge Instructions: Apply moisturizing lotion as directed Prim Dressing: KerraCel Ag Gelling Fiber Dressing, 4x5 in (silver alginate) (  Home Health) 2 x Per Week/15 Days ary Discharge Instructions: Apply silver alginate to wound bed as instructed Secondary Dressing: Woven Gauze Sponge, Non-Sterile 4x4 in (Home Health) 2 x Per Week/15 Days Discharge Instructions: Apply over primary dressing as directed. Secondary Dressing: ABD Pad, 5x9 (Home Health) 2 x Per Week/15 Days Discharge Instructions: Apply over primary dressing as  directed. Compression Wrap: FourPress (4 layer compression wrap) (Home Health) 2 x Per Week/15 Days Discharge Instructions: Apply four layer compression as directed. Wound #21 - Lower Leg Wound Laterality: Left, Anterior Cleanser: Soap and Water (Home Health) 2 x Per Week/15 Days Discharge Instructions: May shower and wash wound with dial antibacterial soap and water prior to dressing change. Cleanser: Wound Cleanser (Home Health) 2 x Per Week/15 Days Discharge Instructions: Cleanse the wound with wound cleanser prior to applying a clean dressing using gauze sponges, not tissue or cotton balls. Peri-Wound Care: Zinc Oxide Ointment 30g tube (Home Health) 2 x Per Week/15 Days Discharge Instructions: Apply Zinc Oxide to periwound with each dressing change Peri-Wound Care: Sween Lotion (Moisturizing lotion) (Home Health) 2 x Per Week/15 Days Discharge Instructions: Apply moisturizing lotion as directed Prim Dressing: KerraCel Ag Gelling Fiber Dressing, 4x5 in (silver alginate) (Home Health) 2 x Per Week/15 Days ary Discharge Instructions: Apply silver alginate to wound bed as instructed Secondary Dressing: Woven Gauze Sponge, Non-Sterile 4x4 in (Home Health) 2 x Per Week/15 Days Discharge Instructions: Apply over primary dressing as directed. Secondary Dressing: ABD Pad, 5x9 (Home Health) 2 x Per Week/15 Days Discharge Instructions: Apply over primary dressing as directed. Compression Wrap: FourPress (4 layer compression wrap) (Home Health) 2 x Per Week/15 Days Discharge Instructions: Apply four layer compression as directed. Wound #22 - Lower Leg Wound Laterality: Left, Anterior, Proximal Cleanser: Soap and Water (Home Health) 2 x Per Week/15 Days Discharge Instructions: May shower and wash wound with dial antibacterial soap and water prior to dressing change. Cleanser: Wound Cleanser (Home Health) 2 x Per Week/15 Days Discharge Instructions: Cleanse the wound with wound cleanser prior to  applying a clean dressing using gauze sponges, not tissue or cotton balls. Peri-Wound Care: Zinc Oxide Ointment 30g tube (Home Health) 2 x Per Week/15 Days Discharge Instructions: Apply Zinc Oxide to periwound with each dressing change Peri-Wound Care: Sween Lotion (Moisturizing lotion) (Home Health) 2 x Per Week/15 Days Discharge Instructions: Apply moisturizing lotion as directed Prim Dressing: KerraCel Ag Gelling Fiber Dressing, 4x5 in (silver alginate) (Home Health) 2 x Per Week/15 Days ary Discharge Instructions: Apply silver alginate to wound bed as instructed Secondary Dressing: Woven Gauze Sponge, Non-Sterile 4x4 in (Home Health) 2 x Per Week/15 Days Discharge Instructions: Apply over primary dressing as directed. Secondary Dressing: ABD Pad, 5x9 (Home Health) 2 x Per Week/15 Days Discharge Instructions: Apply over primary dressing as directed. Compression Wrap: FourPress (4 layer compression wrap) (Home Health) 2 x Per Week/15 Days Discharge Instructions: Apply four layer compression as directed. Electronic Signature(s) Signed: 01/22/2021 6:02:39 PM By: Deon Pilling Signed: 01/28/2021 9:53:33 AM By: Kalman Shan DO Entered By: Deon Pilling on 01/22/2021 09:41:57 -------------------------------------------------------------------------------- Problem List Details Patient Name: Date of Service: [place] 01/22/2021 8:00 A M Medical Record Number: [id number] Patient Account Number: [id number] Date of Birth/Sex: Treating RN: 1940-11-05 (80 y.o. 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 10/11/2019 No Yes extremity I89.0 Lymphedema, not elsewhere classified  10/11/2019 No Yes L97.822 Non-pressure chronic ulcer of other part of left lower leg with fat layer exposed2/06/2020 No Yes L97.812 Non-pressure chronic ulcer of other part of right lower leg with fat layer 01/03/2020 No Yes exposed B93.90 Chronic systolic (congestive) heart failure 10/02/2020 No Yes S90.812D Abrasion, left foot, subsequent encounter 10/09/2020 No Yes Inactive Problems ICD-10 Code Description Active Date Inactive Date L97.521 Non-pressure chronic ulcer of other part of left foot limited to breakdown of skin 10/11/2019 10/11/2019 S90.812D Abrasion, left foot, subsequent encounter 02/07/2020 02/07/2020 I42.9 Cardiomyopathy, unspecified 05/15/2020 05/15/2020 K92.1 Melena 05/15/2020 05/15/2020 Resolved Problems ICD-10 Code Description Active Date Resolved Date 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 10/11/2019 10/11/2019 Electronic Signature(s) Signed: 01/22/2021 8:58:57 AM By: Kalman Shan DO Entered By: Kalman Shan on 01/22/2021 08:54:53 -------------------------------------------------------------------------------- Progress Note Details Patient Name: Date of Service: [place] 01/22/2021 8:00 A M Medical Record Number: [id number] Patient Account Number: [id number] Date of Birth/Sex: Treating RN: 11-Mar-1941 (80 y.o. [name], [name] Primary Care Provider: [name]., [name] Other Clinician: Referring Provider: Treating Provider/Extender: Mack Hook., [name] in Treatment: 33 Subjective Chief Complaint Information obtained from Patient 10/11/2019; patient is here for review of wounds on his bilateral lower extremities and left foot History of Present Illness (HPI) ADMISSION 10/11/2019 This is a 80 year old man with a history of a severe cardiomyopathy with an ejection fraction of about 20%, chronic renal failure stage III. He is listed as a type II diabetic in epic although the patient denies this. He also  has a history of PVD. He states for the last month he has had wounds on his bilateral lower extremities that started off as blisters which denuded. He has areas on the left lateral calf and 2 on the right lateral. He has an area on the left first met head which he did not know was there he we identified this on intake. He has been using Silvadene cream provided by his primary care physician but he is complaining that this burns. 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 He was acutely ill at that time complicating GI bleed. ABIs in our clinic were 1.16 on the right and 1.13 on the left 2/25; the patient comes in with his areas on the left lateral and right lateral calf. There is also an area over the left first MTP bunion deformity. We have been using Sorbact. His edema control is fairly good 3/4; left lateral and right lateral calf. Most of his wounds are in the same position tightly adherent nonviable debris. On the right we debrided the superior wound on the left both wounds. The area on his bunion over the left first MTP medially is I think just about closed. We have been using Sorbact without a lot of success changed to Iodoflex under compression 3/11; left lateral and right lateral calf perhaps minor improvement in the surface condition. We have been using Iodoflex. The area over the bunion of the left first MTP has closed over 3/18; left lateral  and right lateral calf not much improvement. We have been using Iodoflex. Aggressive debridement last week. 3/25; left lateral and right lateral calf. We have been using Iodoflex. There is some  improvement in the superior area on the right and 2 on the lateral left although there is still a lot of debris on the surface. The inferior area on the right is still a completely nonviable surface. 4/8; left lateral and right lateral calfs. Essentially mirror-image looking wounds 2 wounds on each side in close juxtaposition we have been using Iodoflex with some improvement in the very adherent fibrinous debris but not a lot. The patient has arterial studies next Wednesday morning and venous studies next Thursday morning. I have been avoiding any further aggressive debridement until we see the arterial study results. His ABIs were fairly good in this clinic and is dorsalis pedis pulses are palpable but the wound beds are pale. 4/15; left lateral and right lateral calfs. Essentially mirror-image wounds with 2 wounds on each side in close juxtaposition but with rims of separating normal tissue. We have been using Iodoflex. The base of the wounds has been cleaning up quite nicely ARTERIAL STUDIES were done showing the patient had an ABI on the right of 1.27 with triphasic waveforms and a TBI of 0.90 on the left triphasic waveforms with an ABI of 1.28 and a TBI of 0.87. No evidence of arterial disease VENOUS REFLUX STUDIES; were done yesterday we do not have this report yet. 4/23; VENOUS REFLUX STUDIES did not show any significant reflux right lower extremity. No evidence of a DVT He did have significant reflux in the common . femoral vein on the left but nothing else was listed as significant. He did not have a DVT. We have been using Iodoflex under compression his wounds are making progress. 4/29; improvements in the wound surface continue. We have been using Iodoflex. He has a 20% out-of-pocket co-pay for Apligraf which is unfortunate. We changed him to Sorbact today 5/6; started on Sorbact last week. Better looking wound surface but not much change in dimensions. 01/24/20-Patient is back at 3  weeks, wound surfaces are about the same but very minimal slough on the right leg wounds, however there is blistering on both legs adjacent to the wounds, patient denies any other symptoms or new symptoms. His studies have been reviewed and do not indicate any significant arterial or venous disease. But he is attending once in 3 weeks with home health changing in between 6/3; patient has 4 wounds 2 on each side of his lateral lower legs. These wounds are somewhat improved. He apparently arrived in clinic last week with a large blister on the right lateral lower leg that had denuded into the wound. They changed to silver alginate. Still under compression. He has straight Medicare and unfortunately has an unlimited co-pay for advanced treatment options. 6/10; his original 4 wounds are all better especially on the left lateral lower leg. 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 6/24; blister on the right lateral lower leg has healed. There is no additional wounds. The traumatic left second toe is also closed. The 4  that he has are all better 7/1; all of the patient's wounds are smaller except for the proximal one on the left lateral calf. We are using silver collagen 7/22; patient has home health. Relatively refractory wounds on the lateral part of both mid calfs. Each of them 2 wounds. The areas on the left are doing much better in fact the distal 1 looks like it is on his way to closure. The right required debridement with adherent fibrinous debris under illumination. We have been using silver collagen. Previous arterial studies were within normal limits he also  had venous reflux studies 04/03/2020 upon evaluation today patient appears to be doing excellent with regard to his lower extremity ulcers. He is going to require a little bit of debridement on the wounds on the right especially in order to clear away some biofilm and slough but this is minimal and overall he seems to be doing quite well. 8/19; the patient's wounds on the left lateral lower leg just above closed. However the 2 on the lateral part of the right have a nonviable necrotic surface. This is disappointing. There is also no change in dimensions. He has had arterial studies as well as venous reflux studies 8/26; very disappointing today. Even the more superficial areas on the left are about the same as last week. Still 2 punched-out areas completely unchanged on the right. We have been using silver collagen really making no progress. 9/2; changed to Iodoflex last week better looking wound surfaces especially on the right although they are deep and punched out. The areas on the left are more superficial open 1 of these is almost fully epithelialized although it has been this way for at least 2 weeks. He has a 20% co-pay [Medicare only] unaffordable for a skin substitute. Had some thought about a snap VAC on the deep areas on the right leg we will try to put this through in the insurance 9/9; punched-out wounds on the bilateral lateral lower extremities. We have been operating as if the these were chronic venous wounds with secondary lymphedema. The areas on the left have been doing well in fact one of them is closed over. We have had no improvement in the areas on the right we have been using Iodoflex to help with surface debridement. The areas on the right are deeper wider. I do not see any evidence of infection. Home health is not been putting the wraps on high enough he has localized lymphedema right above the wounds. He has had arterial and venous studies already 9/16; punched-out mirror-image  wounds on his bilateral lateral mid calfs. We have 1 closed on the left lateral the other smaller. For the first time today the areas on the right lateral actually looks some better. I did a biopsy of 1 of these last time although we still do not have this result. HOWEVER he arrives in clinic today complaining of chest pain overnight which felt like a brick on his chest. He also had 2 black watery bowel movements. He does not have nausea or vomiting. He is not describing abdominal pain. He has no prior history of diarrhea heartburn etc. 9/30; sent this patient to the ER on 9/16. 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 He is on twice daily PPIs Protonix. His admission hemoglobin was exceptionally low at 5. 8.. Discharged at 8.1. 4 unit transfusion. He was also felt to have congestive heart failure with elevated BNP's he has an EF less than 20  with left ventricular thrombus. His diuretic dose was actually decreased because of elevated creatinine currently at torsemide 20 mg not felt to be a Coumadin candidate. Patient states he feels well. The areas on his left leg are healed. The areas on the right look better. These have been very refractory wounds. He does not have stockings we will make arrangements for this today 10/7; the areas on the left leg are healed we will transition him into his 20/30 stocking today. Continued improvement in the areas on the right lateral leg 10/21; the areas on the left leg remain healed at this 2-week follow-up however there is a history that he developed some drainage from one of the wound sites with home health therefore going ahead and wrapping him. He was supposed to be on 20/30 stockings although the history here is vague and I am not sure that this represents a stocking failure or not. We have been wrapping his right leg with the one remaining wound is the distal wound. We have been using silver collagen 10/28 the patient only has 1 wound  remaining on the right lower calf. This still has 3 mm of depth which is unchanged but the surface area is smaller. The superior wound on the right lateral is closed. The 2 wounds on the left remain closed and he is using his compression stocking 11/4 1 remaining wound on the right lower calf. Under illumination not a viable surface we have been using silver collagen we had good effect on the other areas but this 1 appears to be stalled. There is no open area on the left leg he is using his own stocking. 11/11; 1 remaining wound on the right lower calf. Surface of this is a lot better than last week but still requiring debridement I am using Iodoflex but looking forward to changing the primary dressing to perhaps endoform 11/18; 1 remaining wound on the right lower leg, venous insufficiency, vigorous debridement last week/Iodoflex. Depth of the wound is therefore larger but the wound looks clean. Still very gritty material at the surface requiring debridement 12/2; the remaining wound on his right lower leg, venous insufficiency. I changed him to endoform 2 weeks ago. We appear to be making nice progress. Wound is measuring smaller 12/16; he comes in today with one of his two wounds on the left opened infected second area on the left may be threatened as well as the healed one on the right. He has edema in both legs which I think is pitting. He says his weights are stable he has unknown fairly severe cardiomyopathy. His cardiologist is Dr. Brigitte Pulse it does not sound like he is really been carefully followed 12/23; the areas that I was concerned about on the left look a lot better we put some silver alginate and put him back in compression. It may be the compression that actually does the trick here. The remaining open area we have been using Iodoflex and again this looks a lot better. At our suggestion he went to see his primary who did not adjust his diuretic he is still apparently taking 20 mg of a loop  diuretic and "sometimes" 40 mg is basically what the patient stated 1/6; the area on the left is closed once again. We will transition him into his 20/30 stocking from elastic therapy both areas on the right are now open which is a deterioration from last time at which time the only 1 was open but they are very superficial. We  have been using Hydrofera Blue under compression 1/27; its been 3 weeks since the patient was here. We transitioned him to a 20/30 stocking on the left leg. Still under 4-layer compression on the right. He comes in today with a new small opening on the left leg but reopening on the right. Massive blistering on the right leg. He has severe systemic fluid volume overload. I have looked over the patient's records. He was seen in 2020 by tele health by cardiology. 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 I think he was supposed to have follow-up but that follow-up never seems to have happened. He does not complain of chest pain or shortness of breath but notes increasing edema not just in his lower legs 2/3; he has new open areas on the bilateral anterior lower legs. I think a lot of this is because of systemic fluid volume overload. The ultrasound I ordered showed ascites and suggestion of cirrhosis. His physical exam continues to suggest right greater than left congestive heart failure. I have him taking 40 mg of Lasix oral daily I have asked him to start weighing himself daily. With the intense efforts of our nursing staff we have him a cardiology appointment on Monday or Tuesday of next week I have told him not to miss this. With regards to his wounds I do not think we are going to be able to stop wrapping him until somebody manages to get his fluid volume down 2/10; the areas on his bilateral lateral lower legs actually look better today. He has been taking Lasix 40 mg a day I believe [2 x 20 mg]. He seems to have less  edema in his legs and and is a result of this I think he has less edema in his lower legs as well and the wounds appear to be doing better. He comes in today with purulence coming out of the toenail bed of the left great toe it appears that his toenail came off there was noted to be pus I did a culture of this area 2/24; since the patient was last here 2 weeks ago he was admitted to hospital from 2/12 through 2/16 predominantly acute on chronic systolic heart failure. He also has stage IV chronic kidney disease discharge creatinine at 2.71 and on top of this morphology of the liver on ultrasound suggested cirrhosis of unclear etiology. He had 9.4 L of ascites tapped I am unclear whether this was sent for analysis at the time of this dictation. He is currently taking 40 mg of Demadex a day I am a bit surprised that was so low. Nevertheless the wounds on his legs are a lot better everything is closed on the right he still has an open area on the left and then the area on the great toe nail matrix on the left from last time. 10/30/2020 upon evaluation today patient appears to be doing well with regard to his legs other than the fact that they are extremely swollen. He has multiple blisters noted bilateral lower extremities. Apparently the home health nurse came out yesterday unwrapped them and noticed a couple blisters she called 911 and would not rewrap him. There is no evidence of infection whatsoever we did not receive a phone call that we are aware of anyway regarding the fact that the home health nurse had any concerns at all. Nonetheless the major issue here is that the patient is going to really need  to make sure that his fluids under good control but I do not see anything that indicates he needs to go to the hospital right now. There is literally no evidence of infection although fluid is just completely clear. 3/16; patient has home health. Apparently the last time he was here home health did not  put compression wraps back on his legs after discovering blisters. By the time he got into see [place] he had had several additional blisters and wounds open up. He was put back in 4 layer compression most of this appears to be healing. He still has small superficial areas on his bilateral lower extremities He also has an area in the left great toe nailbed after lost his toe nail. 3/31; 2-week follow-up. Everything on the right leg is healed he has only has a very small open area remaining on the left leg in one of his original wound sites this should be healed by the next time he is here we have good edema control bilaterally. He removed his toenail last time he was here. Drainage from this we are using topical antibiotics Finally he has increasing ascites which I think is probably cirrhosis rather than heart failure. He said he was going to go to the emergency room. I suggested that he go to his primary doctor get the primary doctor updated in where this is. If they are going to do paracentesis then that may be able to be done in interventional radiology rather than sitting for hours in the emergency room. He does not look to be in any distress. 4/14; patient returns to clinic. The stockings he had did not maintain skin integrity on the right he has 3 open wounds here which started on blisters. Predictably everything is closed on the left. He tells me that since he was last here he had to go to the emergency room to deal with the ascites they tapped 8 L of fluid. Again if anybody is aware of the exact etiology of this with certainty the patient is not aware of this. All I know is the ultrasound I did suggested cirrhosis. 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 5/5; patient returns to clinic. Apparently was healed up by home health he did not have his wrap and did not have his stocking and blisters on his leg almost immediately occurred. He has  a large open wound on the left anterior leg small area on the left great toe and 2 areas on the right lateral leg which are better than last time 5/12; patient says his overall weight is up 10 pounds. Some of this obviously ascites however he has clear signs of also systemic fluid volume excess. Sees his primary doctor tomorrow at 3:00. He has a large wound on the left medial lower leg extending posteriorly this was the tremendous blister a smaller area laterally 2 areas on the right leg and an area on his left first toe. He has home health 5/26; patient presents for 2-week follow-up. He states he is feeling well. He has been using silver alginate under 4-layer compression and has had this changed with home health. He has no complaints today. He denies any signs of infection. Patient History Information obtained from Patient. Family History Heart Disease - Father,Siblings, Hypertension - Father,Siblings, Stroke - Siblings, No family history of Cancer, Diabetes, Hereditary Spherocytosis, Kidney Disease, Lung Disease, Seizures, Thyroid Problems, Tuberculosis. Social History Former smoker -  quit 11 years ago, Alcohol Use - Rarely, Drug Use - No History, Caffeine Use - Never. Medical History Hematologic/Lymphatic Patient has history of Anemia Respiratory Denies history of Aspiration, Asthma, Chronic Obstructive Pulmonary Disease (COPD), Pneumothorax, Sleep Apnea, Tuberculosis Cardiovascular Patient has history of Congestive Heart Failure, Hypertension, Peripheral Venous Disease Genitourinary Patient has history of End Stage Renal Disease Integumentary (Skin) Denies history of History of Burn Hospitalization/Surgery History - sphincterotomy. - cholecystostomy. - 10/11/20 inpatient advanced CHF. Medical A Surgical History Notes nd Respiratory history of bronchitis acute respiratory failure Cardiovascular cardiomegaly Gastrointestinal ulcers Genitourinary says he is having prostate  issues Objective Constitutional respirations regular, non-labored and within target range for patient.. Vitals Time Taken: 7:50 AM, Height: 71 in, Weight: 198 lbs, BMI: 27.6, Temperature: 97.7 F, Pulse: 95 bpm, Respiratory Rate: 18 breaths/min, Blood Pressure: 111/65 mmHg. Psychiatric pleasant and cooperative. General Notes: Bilateral lower extremity with open wounds with granulation tissue present. No signs of infection. 2+ pitting edema bilaterally to the thighs. Integumentary (Hair, Skin) Wound #15 status is Open. Original cause of wound was Gradually Appeared. The date acquired was: 10/09/2020. The wound has been in treatment 15 weeks. The wound is located on the Left T Great. The wound measures 1cm length x 2.4cm width x 0.1cm depth; 1.885cm^2 area and 0.188cm^3 volume. There is oe Fat Layer (Subcutaneous Tissue) exposed. There is no tunneling or undermining noted. There is a medium amount of serous drainage noted. The wound margin is flat and intact. There is large (67-100%) pink granulation within the wound bed. There is a small (1-33%) amount of necrotic tissue within the wound bed including Adherent Slough. Wound #16R status is Open. Original cause of wound was Gradually Appeared. The date acquired was: 11/13/2020. The wound has been in treatment 10 weeks. The wound is located on the Right,Lateral Lower Leg. The wound measures 1.3cm length x 0.9cm width x 0.1cm depth; 0.919cm^2 area and 0.092cm^3 volume. There is Fat Layer (Subcutaneous Tissue) exposed. There is no tunneling or undermining noted. There is a medium amount of serous drainage noted. The wound margin is flat and intact. There is large (67-100%) pink granulation within the wound bed. There is a small (1-33%) amount of necrotic tissue within the wound bed including Adherent Slough. Wound #17 status is Open. Original cause of wound was Blister. The date acquired was: 12/04/2020. The wound has been in treatment 6 weeks. The wound  is located on the Right,Proximal,Lateral Lower Leg. The wound measures 1.5cm length x 0.6cm width x 0.1cm depth; 0.707cm^2 area and 0.071cm^3 volume. There is Fat Layer (Subcutaneous Tissue) exposed. There is no tunneling or undermining noted. There is a medium amount of serous drainage noted. The wound margin is flat and intact. There is large (67-100%) pink granulation within the wound bed. There is a small (1-33%) amount of necrotic tissue within the wound bed including Adherent Slough. Wound #19 status is Open. Original cause of wound was Blister. The date acquired was: 12/04/2020. The wound has been in treatment 6 weeks. The wound is located on the Left,Posterior Lower Leg. The wound measures 6.5cm length x 6cm width x 0.1cm depth; 30.631cm^2 area and 3.063cm^3 volume. There is Fat Layer (Subcutaneous Tissue) exposed. There is no tunneling or undermining noted. There is a large amount of serous drainage noted. The wound margin is flat and intact. There is medium (34-66%) red, pink granulation within the wound bed. There is a medium (34-66%) amount of necrotic tissue within the wound bed including Adherent Slough. Wound #20  status is Open. Original cause of wound was Blister. The date acquired was: 01/08/2021. The wound has been in treatment 2 weeks. The wound is located on the Left,Lateral Lower Leg. The wound measures 1cm length x 1.4cm width x 0.1cm depth; 1.1cm^2 area and 0.11cm^3 volume. There is Fat Layer (Subcutaneous Tissue) exposed. There is no tunneling or undermining noted. There is a medium amount of serous drainage noted. The wound margin is flat and intact. There is large (67-100%) pink granulation within the wound bed. There is a small (1-33%) amount of necrotic tissue within the wound bed including Adherent Slough. Wound #21 status is Open. Original cause of wound was Gradually Appeared. The date acquired was: 01/22/2021. The wound is located on the Left,Anterior Lower Leg. The wound  measures 2cm length x 1.8cm width x 0.1cm depth; 2.827cm^2 area and 0.283cm^3 volume. There is Fat Layer (Subcutaneous Tissue) exposed. There is no tunneling or undermining noted. There is a medium amount of serosanguineous drainage noted. The wound margin is distinct with the outline attached to the wound base. There is large (67-100%) red, pink granulation within the wound bed. There is no necrotic tissue within the wound bed. Wound #22 status is Open. Original cause of wound was Gradually Appeared. The date acquired was: 01/22/2021. The wound is located on the Left,Proximal,Anterior Lower Leg. The wound measures 1.1cm length x 1.3cm width x 0.1cm depth; 1.123cm^2 area and 0.112cm^3 volume. There is Fat Layer (Subcutaneous Tissue) exposed. There is no tunneling or undermining noted. There is a medium amount of serosanguineous drainage noted. The wound margin is distinct with the outline attached to the wound base. There is large (67-100%) red granulation within the wound bed. There is no necrotic tissue within the wound bed. Assessment Active Problems ICD-10 Chronic venous hypertension (idiopathic) with inflammation of bilateral lower extremity Lymphedema, not elsewhere classified Non-pressure chronic ulcer of other part of left lower leg with fat layer exposed Non-pressure chronic ulcer of other part of right lower leg with fat layer exposed Chronic systolic (congestive) heart failure Abrasion, left foot, subsequent encounter Patient presents for 2-week follow-up for his lower extremity wounds. Overall they have gotten smaller since last clinic visit and all have granulation tissue present. No signs of infection. We will continue with silver alginate under 4-layer compression and this will be changed with home health next week and he will follow-up with Dr. Dellia Nims in 2 weeks. Procedures Wound #16R Pre-procedure diagnosis of Wound #16R is a Venous Leg Ulcer located on the Right,Lateral Lower  Leg . There was a Four Layer Compression Therapy Procedure by Baruch Gouty, RN. Post procedure Diagnosis Wound #16R: Same as Pre-Procedure Wound #17 Pre-procedure diagnosis of Wound #17 is a Venous Leg Ulcer located on the Right,Proximal,Lateral Lower Leg . There was a Four Layer Compression Therapy Procedure by Baruch Gouty, RN. Post procedure Diagnosis Wound #17: Same as Pre-Procedure Wound #19 Pre-procedure diagnosis of Wound #19 is a Venous Leg Ulcer located on the Left,Posterior Lower Leg . There was a Four Layer Compression Therapy Procedure by Baruch Gouty, RN. Post procedure Diagnosis Wound #19: Same as Pre-Procedure Wound #20 Pre-procedure diagnosis of Wound #20 is a Venous Leg Ulcer located on the Left,Lateral Lower Leg . There was a Four Layer Compression Therapy Procedure by Baruch Gouty, RN. Post procedure Diagnosis Wound #20: Same as Pre-Procedure Wound #21 Pre-procedure diagnosis of Wound #21 is a Lymphedema located on the Left,Anterior Lower Leg . There was a Four Layer Compression Therapy Procedure by Baruch Gouty, RN.  Post procedure Diagnosis Wound #21: Same as Pre-Procedure Wound #22 Pre-procedure diagnosis of Wound #22 is a Lymphedema located on the Left,Proximal,Anterior Lower Leg . There was a Four Layer Compression Therapy Procedure by Baruch Gouty, RN. Post procedure Diagnosis Wound #22: Same as Pre-Procedure Plan Follow-up Appointments: Return Appointment in 2 weeks. - Thursday with Dr. Arcola Jansky Shower/ Hygiene: May shower with protection but do not get wound dressing(s) wet. May shower and wash wound with soap and water. - with dressing changes only. Edema Control - Lymphedema / SCD / Other: Elevate legs to the level of the heart or above for 30 minutes daily and/or when sitting, a frequency of: - 3-4 times a day throughout the day. Avoid standing for long periods of time. Exercise regularly Moisturize legs daily. - right leg every  night. Compression stocking or Garment 20-30 mm/Hg pressure to: - ****if wounds close patient and home health to apply compression stocking to the leg.*** Home Health: No change in wound care orders this week; continue Home Health for wound care. May utilize formulary equivalent dressing for wound treatment orders unless otherwise specified. - [facility] home health twice a week. ****if wounds close on a leg patient and home health to please apply compression stocking to the leg. patient cannot go without compression to legs.*** Please cover any blisters with calcium alginate Ag. 1. Silver alginate under 4-layer compression 2. Follow-up with Dr. Dellia Nims in 2 weeks Electronic Signature(s) Signed: 01/22/2021 8:58:57 AM By: Kalman Shan DO Entered By: Kalman Shan on 01/22/2021 08:58:21 -------------------------------------------------------------------------------- HxROS Details Patient Name: Date of Service: [place], [place] 01/22/2021 8:00 A M Medical Record Number: [id number] Patient Account Number: [id number] Date of Birth/Sex: Treating RN: 29-Jul-1941 (80 y.o. [name], [name] Primary Care Provider: [name]., [name] Other Clinician: Referring Provider: Treating Provider/Extender: Mack Hook., [name] in Treatment: 46 Information Obtained From Patient Hematologic/Lymphatic Medical History: Positive for: Anemia Respiratory Medical History: Negative for: Aspiration; Asthma; Chronic Obstructive Pulmonary Disease (COPD); Pneumothorax; Sleep Apnea; Tuberculosis Past Medical History Notes: history of bronchitis acute respiratory failure Cardiovascular Medical History: Positive for: Congestive Heart Failure; Hypertension; Peripheral Venous Disease Past Medical History Notes: cardiomegaly Gastrointestinal Medical History: Past Medical History Notes: ulcers Genitourinary Medical History: Positive for: End Stage Renal Disease Past Medical  History Notes: says he is having prostate issues Integumentary (Skin) Medical History: Negative for: History of Burn Immunizations Pneumococcal Vaccine: Received Pneumococcal Vaccination: No Implantable Devices No devices added Hospitalization / Surgery History Type of Hospitalization/Surgery sphincterotomy cholecystostomy 10/11/20 inpatient advanced CHF Family and Social History Cancer: No; Diabetes: No; Heart Disease: Yes - Father,Siblings; Hereditary Spherocytosis: No; Hypertension: Yes - Father,Siblings; Kidney Disease: No; Lung Disease: No; Seizures: No; Stroke: Yes - Siblings; Thyroid Problems: No; Tuberculosis: No; Former smoker - quit 11 years ago; Alcohol Use: Rarely; Drug Use: No History; Caffeine Use: Never; Financial Concerns: No; Food, Clothing or Shelter Needs: No; Support System Lacking: No; Transportation Concerns: Yes - uses [employer] officer) Signed: 01/22/2021 8:58:57 AM By: Kalman Shan DO Signed: 01/22/2021 6:02:39 PM By: Deon Pilling Entered By: Kalman Shan on 01/22/2021 08:55:55 -------------------------------------------------------------------------------- SuperBill Details Patient Name: Date of Service: [place], [place] 01/22/2021 Medical Record Number: [id number] Patient Account Number: [id number] Date of Birth/Sex: Treating RN: [birth year] (80 y.o. [name], [name] Primary Care Provider: [name]., [name] Other Clinician: Referring Provider: Treating Provider/Extender: Mack Hook., [name] in Treatment: 59 Diagnosis Coding ICD-10 Codes Code  Description I87.323 Chronic venous hypertension (idiopathic) with inflammation of bilateral lower extremity I89.0 Lymphedema, not elsewhere classified L97.822 Non-pressure chronic ulcer of other part of left lower leg with fat layer exposed L97.812 Non-pressure chronic ulcer of other part of right lower leg with fat layer exposed Y59.09 Chronic systolic  (congestive) heart failure S90.812D Abrasion, left foot, subsequent encounter Facility Procedures The patient participates with Medicare or their insurance follows the Medicare Facility Guidelines: CPT4 Description Modifier Quantity Code [id number] BILATERAL: Application of multi-layer venous compression system; leg (below knee), including ankle and 1 foot. Physician Procedures : CPT4 Code Description Modifier [id number] - WC PHYS LEVEL 3 - EST PT ICD-10 Diagnosis Description I87.323 Chronic venous hypertension (idiopathic) with inflammation of bilateral lower extremity I89.0 Lymphedema, not elsewhere classified L97.822  Non-pressure chronic ulcer of other part of left lower leg with fat layer exposed L97.812 Non-pressure chronic ulcer of other part of right lower leg with fat layer exposed Quantity: 1 Electronic Signature(s) Signed: 01/22/2021 8:58:57 AM By: Kalman Shan DO Entered By: Kalman Shan on 01/22/2021 08:58:37

## 2021-01-29 DIAGNOSIS — L8990 Pressure ulcer of unspecified site, unspecified stage: Secondary | ICD-10-CM | POA: Insufficient documentation

## 2021-01-29 DIAGNOSIS — I5023 Acute on chronic systolic (congestive) heart failure: Secondary | ICD-10-CM | POA: Diagnosis not present

## 2021-01-29 LAB — BASIC METABOLIC PANEL
Anion gap: 7 (ref 5–15)
BUN: 60 mg/dL — ABNORMAL HIGH (ref 8–23)
CO2: 29 mmol/L (ref 22–32)
Calcium: 8.2 mg/dL — ABNORMAL LOW (ref 8.9–10.3)
Chloride: 101 mmol/L (ref 98–111)
Creatinine, Ser: 2.46 mg/dL — ABNORMAL HIGH (ref 0.61–1.24)
GFR, Estimated: 26 mL/min — ABNORMAL LOW (ref >60)
Glucose, Bld: 97 mg/dL (ref 70–99)
Potassium: 3.9 mmol/L (ref 3.5–5.1)
Sodium: 137 mmol/L (ref 135–145)

## 2021-01-29 MED ORDER — METOPROLOL TARTRATE 25 MG PO TABS
12.5000 mg | ORAL_TABLET | Freq: Two times a day (BID) | ORAL | Status: DC
  Administered 2021-01-29 – 2021-02-10 (×24): 12.5 mg via ORAL
  Filled 2021-01-29 (×23): qty 1

## 2021-01-29 MED ORDER — TORSEMIDE 20 MG PO TABS
40.0000 mg | ORAL_TABLET | Freq: Every day | ORAL | Status: DC
  Administered 2021-01-30: 40 mg via ORAL
  Filled 2021-01-29: qty 2

## 2021-01-29 NOTE — Progress Notes (Signed)
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  He was found to be severely volume overloaded on presentation and was started on IV Lasix.  Cardiology also consulted and following.  Plan for discharge tomorrow to home with home health  Assessment & Plan:   Principal Problem:   CHF exacerbation ([place]) Active Problems:   Diabetes mellitus without complication ([place])   Alcohol abuse   Essential hypertension   CKD (chronic kidney disease), stage IV ([facility])   Pressure injury of skin   Volume overload/severe systolic congestive heart failure: Secondary to acute on chronic systolic  heart failure/congestive cardiomyopathy.  Anasarca also contributed by liver cirrhosis/hypoalbuminemia.  Echocardiogram showed ejection fraction of 10-20% Cardiology following.  Started on IV diuretics,now on torsemide.  Continue to monitor input/output, daily weight. Currently on hydralazine,metoprolol and Imdur.  Continue compression wraps for bilateral lower extremity edema  Ascites: History of liver cirrhosis from alcohol.  Might also have been contributed by congestive hepatopathy.  Status post paracentesis with removal of 7  L of fluid.  Has been getting  paracentesis once a month for the last 3 months.His abdomen looked distended again. He will definitely need recurrent paracentesis in the future.  He also needs to follow up with GI as an outpatient.  Underwent another session of paracentesis on 5/31 with removal of 4.3 L of fluid. We have reached to [facility]  gastroenterology to arrange a follow-up appointment as an outpatient so that he can be referred for recurrent  paracentesis in the future.They will set up outpatient appointment.  Left ventricular thrombus: Not a candidate for anticoagulation due to noncompliance, GI bleed  CKD stage IV: Currently kidney function at baseline and is improving with diuresis  Normocytic anemia: Hemoglobin is in the range of 7 today. Anemia is most likely associated with CKD.  No evidence of acute blood loss. he has history of GI bleed.  Normal iron as per  iron studies.  We will transfuse if hemoglobin drops less than 7.  Severe BPH/chronic bladder outlet obstruction: On chronic indwelling Foley catheter.  Changed monthly.  Chronic alcoholism: Reports quitting drinking 6 months ago, not sure if this is true.  No signs of withdrawal.  Continue monitoring for withdrawal.  Continue thiamine, folic acid.  Chronic bilateral lower extremity wound: Follows at wound care center,Dr Dellia Nims.  Wound care consulted here  Debility/deconditioning/goals of care: Palliative care also following during this hospitalization and recommend palliative care follow-up as an outpatient when he is discharged.  PT/OT recommended home health on discharge.         DVT prophylaxis:Heparin [place] Code Status: DNR Family Communication: Called sister for update on 01/27/21 Status is: Inpatient  Remains inpatient appropriate because:Inpatient level of care appropriate due to severity of illness   Dispo: The patient is from: Home  Anticipated d/c is to: Home tomorrow              Patient currently  is not medically stable to d/c.   Difficult to place patient No     Consultants: Cardiology  Procedures:Paracentesis  Antimicrobials:  Anti-infectives (From admission, onward)   None      Subjective:  Patient seen and examined the bedside this morning.  Hemodynamically stable.  Comfortable, sitting on the chair.  Denies any shortness of breath or cough today.  Lower extremity edema improving.  Objective: Vitals:   01/28/21 1226 01/28/21 2200 01/29/21 0522 01/29/21 0527  BP: 112/60 (!) 99/56 (!) 100/59   Pulse: 92 96 94   Resp: 18 20 19    Temp: (!) 97.4 F (36.3 C) 98.1 F (36.7 C) 97.7 F (36.5 C)   TempSrc: Oral     SpO2: 100% 100% 100%   Weight:    82.1 kg    Intake/Output Summary (Last 24 hours) at 01/29/2021 0753 Last data filed at 01/29/2021 0527 Gross per 24 hour  Intake 3 ml  Output 2000 ml  Net -1997 ml   Filed Weights   01/26/21 0500 01/27/21 0500 01/29/21 0527  Weight: 91.4 kg 88.5 kg 82.1 kg    Examination:    General exam: Overall comfortable, not in distress, very deconditioned, debilitated, Respiratory system: Diminished air sounds bilaterally, no wheezes or crackles  Cardiovascular system: S1 & S2 heard, RRR.  Gastrointestinal system: Abdomen is distended, soft and nontender.  Ascites Central nervous system: Alert and oriented Extremities: Bilateral lower extremity trace edema, no clubbing ,no cyanosis Skin: Ulcers on bilateral lower extremities  Data Reviewed: I have personally reviewed following labs and imaging studies  CBC: Recent Labs  Lab 01/23/21 2146 01/27/21 0413 01/28/21 0441  WBC 3.9* 4.7 5.1  NEUTROABS 3.2 3.6 4.0  HGB 8.5* 7.0* 7.8*  HCT 28.6* 23.5* 26.2*  MCV 88.8 88.7 89.1  PLT 267 231 397   Basic Metabolic Panel: Recent Labs  Lab 01/24/21 0500 01/25/21 0448 01/26/21 0523 01/27/21 0413 01/27/21 2317 01/28/21 0441 01/29/21 0441  NA  --  138 138 139 142 136 137  K  --  4.2 3.7 4.0 3.8 4.0 3.9  CL  --  107  105 105 104 101 101  CO2  --  26 26 29  32 27 29  GLUCOSE  --  112* 134* 100* 142* 89 97  BUN  --  69* 66* 62* 60* 56* 60*  CREATININE  --  2.70* 2.68* 2.42* 2.70* 2.37* 2.46*  CALCIUM  --  8.2* 8.2* 8.1* 8.4* 8.0* 8.2*  MG 1.9 2.0  --   --  2.1  --   --   PHOS 3.1  --   --   --   --   --   --    GFR: Estimated Creatinine Clearance: 26.8 mL/min (A) (by C-G formula based on SCr of 2.46 mg/dL (H)). Liver Function Tests: Recent Labs  Lab 01/23/21 2146  AST 15  ALT 7  ALKPHOS 55  BILITOT 0.8  PROT 9.0*  ALBUMIN 2.7*   No results for input(s): LIPASE, AMYLASE in the last 168 hours. No results for input(s): AMMONIA in the last 168 hours. Coagulation Profile: Recent Labs  Lab 01/27/21 1627  INR 1.2   Cardiac Enzymes: No results for input(s): CKTOTAL, CKMB, CKMBINDEX, TROPONINI in the last 168 hours. BNP (last 3 results) No results for input(s): PROBNP in the last 8760 hours. HbA1C: No results for input(s):  HGBA1C in the last 72 hours. CBG: No results for input(s): GLUCAP in the last 168 hours. Lipid Profile: No results for input(s): CHOL, HDL, LDLCALC, TRIG, CHOLHDL, LDLDIRECT in the last 72 hours. Thyroid Function Tests: No results for input(s): TSH, T4TOTAL, FREET4, T3FREE, THYROIDAB in the last 72 hours. Anemia Panel: Recent Labs    01/27/21 0917  FERRITIN 59  TIBC 291  IRON 51   Sepsis Labs: No results for input(s): PROCALCITON, LATICACIDVEN in the last 168 hours.  Recent Results (from the past 240 hour(s))  Resp Panel by RT-PCR (Flu A&B, Covid) Nasopharyngeal Swab     Status: None   Collection Time: 01/23/21 10:20 PM   Specimen: Nasopharyngeal Swab; Nasopharyngeal(NP) swabs in vial transport medium  Result Value Ref Range Status   SARS Coronavirus 2 by RT PCR NEGATIVE NEGATIVE Final    Comment: (NOTE) SARS-CoV-2 target nucleic acids are NOT DETECTED.  The SARS-CoV-2 RNA is generally detectable in upper respiratory specimens during the acute phase of  infection. The lowest concentration of SARS-CoV-2 viral copies this assay can detect is 138 copies/mL. A negative result does not preclude SARS-Cov-2 infection and should not be used as the sole basis for treatment or other patient management decisions. A negative result may occur with  improper specimen collection/handling, submission of specimen other than nasopharyngeal swab, presence of viral mutation(s) within the areas targeted by this assay, and inadequate number of viral copies(<138 copies/mL). A negative result must be combined with clinical observations, patient history, and epidemiological information. The expected result is Negative.  Fact Sheet for Patients:  EntrepreneurPulse.com.au  Fact Sheet for Healthcare Providers:  IncredibleEmployment.be  This test is no t yet approved or cleared by the [place] FDA and  has been authorized for detection and/or diagnosis of SARS-CoV-2 by FDA under an Emergency Use Authorization (EUA). This EUA will remain  in effect (meaning this test can be used) for the duration of the COVID-19 declaration under Section 564(b)(1) of the Act, 21 U.S.C.section 360bbb-3(b)(1), unless the authorization is terminated  or revoked sooner.       Influenza A by PCR NEGATIVE NEGATIVE Final   Influenza B by PCR NEGATIVE NEGATIVE Final    Comment: (NOTE) The Xpert Xpress SARS-CoV-2/FLU/RSV plus assay is intended as an aid in the diagnosis of influenza from Nasopharyngeal swab specimens and should not be used as a sole basis for treatment. Nasal washings and aspirates are unacceptable for Xpert Xpress SARS-CoV-2/FLU/RSV testing.  Fact Sheet for Patients: EntrepreneurPulse.com.au  Fact Sheet for Healthcare Providers: IncredibleEmployment.be  This test is not yet approved or cleared by the [place] FDA and has been authorized for detection and/or diagnosis of SARS-CoV-2  by FDA under an Emergency Use Authorization (EUA). This EUA will remain in effect (meaning this test can be used) for the duration of the COVID-19 declaration under Section 564(b)(1) of the Act, 21 U.S.C. section 360bbb-3(b)(1), unless the authorization is terminated or revoked.  Performed at [facility], [place] [address], [place], [place] [address]          Radiology Studies: US Paracentesis  Result Date: 01/27/2021 INDICATION: Patient with history of alcoholic cirrhosis, CHF, end-stage cardiomyopathy, chronic kidney disease, recurrent ascites. Request received for therapeutic paracentesis. EXAM: ULTRASOUND GUIDED THERAPEUTIC PARACENTESIS MEDICATIONS: 1% lidocaine to skin and subcutaneous tissue COMPLICATIONS: None immediate. PROCEDURE: Informed written consent was obtained from the patient after a discussion of the risks, benefits and alternatives to treatment. A timeout was performed prior to the initiation of the  procedure. Initial ultrasound scanning demonstrates a large amount of ascites within the right lower abdominal quadrant. The right lower abdomen was prepped and draped in the usual sterile fashion. 1% lidocaine was used for local anesthesia. Following this, a 19 gauge, 10-cm, Yueh catheter was introduced. An ultrasound image was saved for documentation purposes. The paracentesis was performed. The catheter was removed and a dressing was applied. The patient tolerated the procedure well without immediate post procedural complication. FINDINGS: A total of approximately 4.3 liters of milky/cream colored fluid was removed. IMPRESSION: Successful ultrasound-guided therapeutic paracentesis yielding 4.3 liters of peritoneal fluid. Read by: Rowe Robert, PA-C Electronically Signed   By: [name].  Shick M.D.   On: 01/27/2021 12:11        Scheduled Meds: . Chlorhexidine Gluconate Cloth  6 each Topical Daily  . folic acid  1 mg Oral Daily  . furosemide  40 mg Intravenous  BID  . heparin  5,000 Units Subcutaneous Q8H  . hydrALAZINE  10 mg Oral Q12H  . isosorbide dinitrate  10 mg Oral BID  . multivitamin with minerals  1 tablet Oral Daily  . sodium chloride flush  3 mL Intravenous Q12H  . thiamine  100 mg Oral Daily   Or  . thiamine  100 mg Intravenous Daily   Continuous Infusions: . sodium chloride       LOS: 5 days    Time spent: 25 mins.More than 50% of that time was spent in counseling and/or coordination of care.      Shelly Coss, MD [facility] P6/09/2020, 7:53 AM

## 2021-01-29 NOTE — Progress Notes (Addendum)
Progress Note  Patient Name: [name] Date of Encounter: 01/29/2021  [facility] Cardiologist: Candee Furbish, MD   Subjective   Patient states he is feeling much better. He noted his leg swelling are significantly going down. He is still urinating a lot. He is tolerating PO intake well. He denied any heart palpitation, chest pain. He states he has transportation and is able to come to the office for follow up.    Inpatient Medications    Scheduled Meds: . Chlorhexidine Gluconate Cloth  6 each Topical Daily  . folic acid  1 mg Oral Daily  . heparin  5,000 Units Subcutaneous Q8H  . hydrALAZINE  10 mg Oral Q12H  . isosorbide dinitrate  10 mg Oral BID  . metoprolol tartrate  12.5 mg Oral BID  . multivitamin with minerals  1 tablet Oral Daily  . sodium chloride flush  3 mL Intravenous Q12H  . thiamine  100 mg Oral Daily   Or  . thiamine  100 mg Intravenous Daily  . [START ON 01/30/2021] torsemide  40 mg Oral Daily   Continuous Infusions: . sodium chloride     PRN Meds: sodium chloride, acetaminophen, sodium chloride flush   Vital Signs    Vitals:   01/28/21 1226 01/28/21 2200 01/29/21 0522 01/29/21 0527  BP: 112/60 (!) 99/56 (!) 100/59   Pulse: 92 96 94   Resp: 18 20 19    Temp: (!) 97.4 F (36.3 C) 98.1 F (36.7 C) 97.7 F (36.5 C)   TempSrc: Oral     SpO2: 100% 100% 100%   Weight:    82.1 kg    Intake/Output Summary (Last 24 hours) at 01/29/2021 1016 Last data filed at 01/29/2021 0826 Gross per 24 hour  Intake 243 ml  Output 2250 ml  Net -2007 ml   Last 3 Weights 01/29/2021 01/27/2021 01/26/2021  Weight (lbs) 181 lb 195 lb 1.7 oz 201 lb 8 oz  Weight (kg) 82.1 kg 88.5 kg 91.4 kg  Some encounter information is confidential and restricted. Go to Review Flowsheets activity to see all data.      Telemetry    Sinus rhythm with tachycardia 90- 110s with occasional PVCs, 3-4 beats NSVTs noted occasionally - Personally Reviewed  ECG    No new tracing today -  Personally Reviewed  Physical Exam   GEN: No acute distress. Oral cavity moist.   Neck: JVD 5cm  Cardiac: RRR, + murmur LSB Respiratory: Clear to auscultation bilaterally. On room air  GI: Decreased distention of firm abdomen  MS: Anasarca with pitting edema of bilateral feet up to scrotum, much improved/resolving, ACE wrap of BLE remains  Neuro:  Nonfocal  Psych: Normal affect   Labs    High Sensitivity Troponin:   Recent Labs  Lab 01/23/21 2146 01/24/21 0047  TROPONINIHS 41* 42*      Chemistry Recent Labs  Lab 01/23/21 2146 01/25/21 0448 01/27/21 2317 01/28/21 0441 01/29/21 0441  NA 138   < > 142 136 137  K 4.3   < > 3.8 4.0 3.9  CL 107   < > 104 101 101  CO2 20*   < > 32 27 29  GLUCOSE 104*   < > 142* 89 97  BUN 66*   < > 60* 56* 60*  CREATININE 2.67*   < > 2.70* 2.37* 2.46*  CALCIUM 8.7*   < > 8.4* 8.0* 8.2*  PROT 9.0*  --   --   --   --  ALBUMIN 2.7*  --   --   --   --   AST 15  --   --   --   --   ALT 7  --   --   --   --   ALKPHOS 55  --   --   --   --   BILITOT 0.8  --   --   --   --   GFRNONAA 24*   < > 24* 28* 26*  ANIONGAP 11   < > 6 8 7    < > = values in this interval not displayed.     Hematology Recent Labs  Lab 01/23/21 2146 01/27/21 0413 01/28/21 0441  WBC 3.9* 4.7 5.1  RBC 3.22* 2.65* 2.94*  HGB 8.5* 7.0* 7.8*  HCT 28.6* 23.5* 26.2*  MCV 88.8 88.7 89.1  MCH 26.4 26.4 26.5  MCHC 29.7* 29.8* 29.8*  RDW 18.8* 19.2* 19.2*  PLT 267 231 264    BNP Recent Labs  Lab 01/23/21 2146  BNP 2,820.0*     DDimer No results for input(s): DDIMER in the last 168 hours.   Radiology    US Paracentesis  Result Date: 01/27/2021 INDICATION: Patient with history of alcoholic cirrhosis, CHF, end-stage cardiomyopathy, chronic kidney disease, recurrent ascites. Request received for therapeutic paracentesis. EXAM: ULTRASOUND GUIDED THERAPEUTIC PARACENTESIS MEDICATIONS: 1% lidocaine to skin and subcutaneous tissue COMPLICATIONS: None immediate.  PROCEDURE: Informed written consent was obtained from the patient after a discussion of the risks, benefits and alternatives to treatment. A timeout was performed prior to the initiation of the procedure. Initial ultrasound scanning demonstrates a large amount of ascites within the right lower abdominal quadrant. The right lower abdomen was prepped and draped in the usual sterile fashion. 1% lidocaine was used for local anesthesia. Following this, a 19 gauge, 10-cm, Yueh catheter was introduced. An ultrasound image was saved for documentation purposes. The paracentesis was performed. The catheter was removed and a dressing was applied. The patient tolerated the procedure well without immediate post procedural complication. FINDINGS: A total of approximately 4.3 liters of milky/cream colored fluid was removed. IMPRESSION: Successful ultrasound-guided therapeutic paracentesis yielding 4.3 liters of peritoneal fluid. Read by: Rowe Robert, PA-C Electronically Signed   By: [name].  Shick M.D.   On: 01/27/2021 12:11    Cardiac Studies   Echo from 10/14/20:  1. No left ventricular thrombus with Definity contrast. Left ventricular  ejection fraction, by estimation, is <20%. The left ventricle has severely  decreased function. The left ventricle demonstrates global hypokinesis.  The left ventricular internal cavity size was severely dilated.  2. Right ventricular systolic function is moderately reduced. The right  ventricular size is severely enlarged. There is moderately elevated  pulmonary artery systolic pressure. The estimated right ventricular  systolic pressure is 25.4 mmHg.  3. Left atrial size was severely dilated.  4. Right atrial size was severely dilated.  5. The mitral valve is normal in structure. Mild mitral valve  regurgitation.  6. The aortic valve is tricuspid. Aortic valve regurgitation is not  visualized. Mild aortic valve sclerosis is present, with no evidence of  aortic valve  stenosis.   Comparison(s): Prior images reviewed side by side. No thrombus is seen in  the left ventricle with Definity contrast, on the current images.   Patient Profile     80 y.o. male with a complex past medical history of chronic systolic and diastolic heart failure with EF 10-20%, NSVT, LV thrombus 05/2019, recurrent GI bleed, hypertension,  hyperlipidemia, type 2 diabetes, CKD stage IV, BPH, chronic lower extremity wound, alcohol abuse, medication noncompliance, COVID-19 infection 09/2020.  Patient presented with dyspnea, anasarca in the setting of running out of torsemide.  Cardiology was consulted and following for decompensated heart failure.   Assessment & Plan    Acute on chronic systolic and diastolic biventricular heart failure End-stage dilated cardiomyopathy - History of dilated cardiomyopathy since 2013, had recurrent hospitalization for decompensated heart failure in the past, was seen by advanced heart failure at [facility], has history of medication noncompliance and failure to follow-up - He was on Coreg 3.125mg  BID and torsemide 40 mg daily at home, ran out of torsemide before this admission, historically unable use ACEi/ARB/ARNI/SGLT2i/MRA due to advanced kidney disease  - Echo from 10/14/20 EF <20%, similar to 05/2019 Echo  - no ischemic workup in the past, presumed due to ETOH abuse  - Net -10.4 L and weight down from 202 >181 pounds since admission  - will discontinue IV lasix, transition to home dose torsemide 40mg  daily starting tomorrow - GDMT: Coreg held in the setting of volume overload/low BP, started hydralazine 10mg  BID and isordil 10mg  BID,  tolerating with low BP, will discontinue Coreg and start metoprolol 12.5mg  BID today given NSVTs and tachycardia, unable use ACEi/ARB/ARNI/SGLT2i/MRA due to advanced kidney disease  - continue thiamine and folic acid long term - goals of care per primary team/palliative care    NSVT - noted on telemetry - Coreg has been  held since admission, BP low, will start metoprolol 12.5mg  BID today, monitor tolerance   Alcoholic cirrhosis with recurrent ascites History of EtOH abuse Hx of recurrent GI bleed  - patient reports stopped ETOH use for 1 yr - LFTs and bilirubin WNL POA, INR 1.2 - no clinical evidence of GI bleed, hepatic encephalopathy and SBP, clinically compensated cirrhosis  - abdomen imaging from 2020 showing cirrhosis morphology  - s/p paracentesis by IR on 01/27/21 with 4.3 L milky/cream colored fluid , no ascitic fluid analysis /cytology sent, consider evaluation of [place] and outpatient arrangement of routine paracentesis  - discussed absolute ETOH cessation   ? AKI on CKD stage IV Cardiorenal syndrome - Cr 2.67 POA, has been >2 over the past year, unclear if this is near baseline  - Cr downtrend from 2.7 to 2.37 with diuresis, anasarca improving, Cr 2.46 today,  monitor hepatorenal syndrome post paracentesis  - continue diuresis as above trend BMP daily    History of LV thrombus on Echo 2020 -Historically took Coumadin, this was stopped due to recurrent GI bleed, given high risk of bleeding and resolved thrombus on Echo 09/2020, no recommendation for therapeutic anticoagulation at this time   Hypertension - BP low normal, tolerating low dose hydralazine and isordil and lasix, transition to PO torsemide today and add metoprolol   Type 2 diabetes BPH Anemia of chronic disease  - management per primary team    Will arrange appointment with advanced heart failure clinic in 2-3 weeks and he is scheduled with CHF heart impact clinic on 02/06/21 at 8 AM.   For questions or updates, please contact [facility] Please consult [email or web address] for contact info under    Signed, Margie Billet, NP  01/29/2021, 10:16 AM    History and all data above reviewed.  Patient examined.  I agree with the findings as above.    He is now breathing at baseline.  The patient exam reveals COR:RRR  ,  Lungs: Clear  ,  Abd:  Positive  bowel sounds, no rebound no guarding, Ext Moderate edema  .  All available labs, radiology testing, previous records reviewed. Agree with documented assessment and plan.   Acute systolic HF:  Change to PO Torsemide in the morning.  Follow renal function closely.  Unable to titrate meds with borderline BP.    Jeneen Rinks Jaquelyne Firkus  12:39 PM  01/29/2021

## 2021-01-29 NOTE — Progress Notes (Signed)
Physical Therapy Treatment Patient Details Name: [name] MRN: [id number] DOB: December 09, 1940 Today's Date: 01/29/2021    History of Present Illness Pt admitted through ED with c/o LE, abdominal and testicular swelling and dx with CHF exacerbation.  Pt with hx of PVD, demand ischemia, CHF, end stage cardiomegly, etoh abuse, Cirrhosis with recurrent ascites, chronic indwelling catheter, chonice LE wounds (being treated at wound center)    PT Comments    Pt progressed with ambulation, tolerates 40 ft with RW x2 bouts with seated rest between. Pt with dyspnea, on RA with SpO2 100%. Pt tolerates BLE strengthening exercises without pain. Pt with discomfort in scrotum with transfers requiring increased time. Will continue to progress acute PT as able.   Follow Up Recommendations  Home health PT     Equipment Recommendations  None recommended by PT    Recommendations for Other Services       Precautions / Restrictions Precautions Precautions: Fall Precaution Comments: blind R eye Restrictions Weight Bearing Restrictions: No    Mobility  Bed Mobility Overal bed mobility: Needs Assistance Bed Mobility: Supine to Sit  Supine to sit: Min guard;HOB elevated  General bed mobility comments: HOB elevated, slow to upright trunk and scoot out to EOB due to genital swelling/pain; prefers propped on L forearm but able to come to upright sitting    Transfers Overall transfer level: Needs assistance Equipment used: Rolling walker (2 wheeled) Transfers: Sit to/from Stand Sit to Stand: Min guard  General transfer comment: BUE assisting to power up, slow to rise with wide stance  Ambulation/Gait Ambulation/Gait assistance: Min guard Gait Distance (Feet): 40 Feet (x2) Assistive device: Rolling walker (2 wheeled) Gait Pattern/deviations: Step-through pattern;Decreased stride length;Trunk flexed;Wide base of support Gait velocity: decrease   General Gait Details: slow, short steps with  flexed trunk, maintains wide stance, no LOB, limited by 2/4 dyspnea, SpO2 100% on RA   Stairs             Wheelchair Mobility    Modified Rankin (Stroke Patients Only)       Balance Overall balance assessment: Needs assistance Sitting-balance support: No upper extremity supported;Feet supported Sitting balance-Leahy Scale: Good  Standing balance support: Bilateral upper extremity supported;During functional activity Standing balance-Leahy Scale: Poor Standing balance comment: reliant on UE support           Cognition Arousal/Alertness: Awake/alert Behavior During Therapy: WFL for tasks assessed/performed Overall Cognitive Status: Within Functional Limits for tasks assessed         Exercises General Exercises - Lower Extremity Long Arc Quad: Seated;AROM;Strengthening;Both;20 reps Hip Flexion/Marching: Seated;AROM;Strengthening;Both;20 reps Toe Raises: Seated;AROM;Strengthening;Both;20 reps Heel Raises: Seated;AROM;Strengthening;Both;20 reps    General Comments        Pertinent Vitals/Pain Pain Assessment: Faces Faces Pain Scale: Hurts a little bit Pain Location: scrotum with movement Pain Descriptors / Indicators: Grimacing;Guarding    Home Living                      Prior Function            PT Goals (current goals can now be found in the care plan section) Acute Rehab PT Goals Patient Stated Goal: Reduce swelling, regain mobility and IND, return home PT Goal Formulation: With patient Time For Goal Achievement: 02/09/21 Potential to Achieve Goals: Fair Progress towards PT goals: Progressing toward goals    Frequency    Min 3X/week      PT Plan Current plan remains appropriate    Co-evaluation  AM-PAC PT "6 Clicks" Mobility   Outcome Measure  Help needed turning from your back to your side while in a flat bed without using bedrails?: A Little Help needed moving from lying on your back to sitting on the side  of a flat bed without using bedrails?: A Little Help needed moving to and from a bed to a chair (including a wheelchair)?: A Little Help needed standing up from a chair using your arms (e.g., wheelchair or bedside chair)?: A Little Help needed to walk in hospital room?: A Little Help needed climbing 3-5 steps with a railing? : A Little 6 Click Score: 18    End of Session Equipment Utilized During Treatment: Gait belt Activity Tolerance: Patient tolerated treatment well;Patient limited by fatigue Patient left: in bed;with call bell/phone within reach;with bed alarm set Nurse Communication: Mobility status PT Visit Diagnosis: Muscle weakness (generalized) (M62.81);Difficulty in walking, not elsewhere classified (R26.2)     Time: 1035-1100 PT Time Calculation (min) (ACUTE ONLY): 25 min  Charges:  $Gait Training: 8-22 mins $Therapeutic Exercise: 8-22 mins                      Tori Islah Eve PT, DPT 01/29/21, 12:25 PM

## 2021-01-30 DIAGNOSIS — I5043 Acute on chronic combined systolic (congestive) and diastolic (congestive) heart failure: Secondary | ICD-10-CM

## 2021-01-30 LAB — CBC WITH DIFFERENTIAL/PLATELET
Abs Immature Granulocytes: 0.02 10*3/uL (ref 0.00–0.07)
Basophils Absolute: 0 10*3/uL (ref 0.0–0.1)
Basophils Relative: 1 %
Eosinophils Absolute: 0.2 10*3/uL (ref 0.0–0.5)
Eosinophils Relative: 4 %
HCT: 24.8 % — ABNORMAL LOW (ref 39.0–52.0)
Hemoglobin: 7.4 g/dL — ABNORMAL LOW (ref 13.0–17.0)
Immature Granulocytes: 0 %
Lymphocytes Relative: 8 %
Lymphs Abs: 0.4 10*3/uL — ABNORMAL LOW (ref 0.7–4.0)
MCH: 26.6 pg (ref 26.0–34.0)
MCHC: 29.8 g/dL — ABNORMAL LOW (ref 30.0–36.0)
MCV: 89.2 fL (ref 80.0–100.0)
Monocytes Absolute: 0.4 10*3/uL (ref 0.1–1.0)
Monocytes Relative: 9 %
Neutro Abs: 3.8 10*3/uL (ref 1.7–7.7)
Neutrophils Relative %: 78 %
Platelets: 288 10*3/uL (ref 150–400)
RBC: 2.78 MIL/uL — ABNORMAL LOW (ref 4.22–5.81)
RDW: 20 % — ABNORMAL HIGH (ref 11.5–15.5)
WBC: 4.9 10*3/uL (ref 4.0–10.5)
nRBC: 0 % (ref 0.0–0.2)

## 2021-01-30 LAB — BASIC METABOLIC PANEL
Anion gap: 6 (ref 5–15)
BUN: 63 mg/dL — ABNORMAL HIGH (ref 8–23)
CO2: 30 mmol/L (ref 22–32)
Calcium: 8.2 mg/dL — ABNORMAL LOW (ref 8.9–10.3)
Chloride: 100 mmol/L (ref 98–111)
Creatinine, Ser: 2.42 mg/dL — ABNORMAL HIGH (ref 0.61–1.24)
GFR, Estimated: 27 mL/min — ABNORMAL LOW (ref >60)
Glucose, Bld: 109 mg/dL — ABNORMAL HIGH (ref 70–99)
Potassium: 4.3 mmol/L (ref 3.5–5.1)
Sodium: 136 mmol/L (ref 135–145)

## 2021-01-30 LAB — MAGNESIUM: Magnesium: 1.9 mg/dL (ref 1.7–2.4)

## 2021-01-30 MED ORDER — MAGNESIUM SULFATE 2 GM/50ML IV SOLN
2.0000 g | Freq: Once | INTRAVENOUS | Status: AC
  Administered 2021-01-30: 2 g via INTRAVENOUS
  Filled 2021-01-30: qty 50

## 2021-01-30 MED ORDER — TORSEMIDE 20 MG PO TABS
20.0000 mg | ORAL_TABLET | Freq: Every day | ORAL | Status: DC
  Administered 2021-01-31 – 2021-02-01 (×2): 20 mg via ORAL
  Filled 2021-01-30 (×2): qty 1

## 2021-01-30 NOTE — Care Management Important Message (Signed)
Important Message  Patient Details IM Letter given to the Patient. Name: [name] MRN: [id number] Date of Birth: 1941/02/14   Medicare Important Message Given:  Yes     [name] 01/30/2021, 11:06 AM

## 2021-01-30 NOTE — Progress Notes (Signed)
Patient had a nonsustained 6 beat run of Vtach. RN went to assess patient, patient asymptomatic. VSS. Provider paged and made aware. Patient currently back in NSR.

## 2021-01-30 NOTE — Progress Notes (Signed)
Physical Therapy Treatment Patient Details Name: [name] MRN: [id number] DOB: 1941/07/15 Today's Date: 01/30/2021    History of Present Illness Pt admitted through ED with c/o LE, abdominal and testicular swelling and dx with CHF exacerbation.  Pt with hx of PVD, demand ischemia, CHF, end stage cardiomegly, etoh abuse, Cirrhosis with recurrent ascites, chronic indwelling catheter, chonice LE wounds (being treated at wound center)    PT Comments    Pt tolerates 108ft ambulation bout with RW, unable to complete 2nd bout but agreeable to exercises. Pt reports having chest pain and headache this morning, denies complaints during session. Pt cued through BLE strengthening exercises with cues for motor control. Pt educated to perform additional sets/reps later today while up in chair and pt verbalized agreement. Will continue to progress acute PT as able.   Follow Up Recommendations  Home health PT     Equipment Recommendations  None recommended by PT    Recommendations for Other Services       Precautions / Restrictions Precautions Precautions: Fall Precaution Comments: blind R eye Restrictions Weight Bearing Restrictions: No    Mobility  Bed Mobility Overal bed mobility: Needs Assistance Bed Mobility: Supine to Sit  Supine to sit: Supervision;HOB elevated  General bed mobility comments: HOB elevated, increased time    Transfers Overall transfer level: Needs assistance Equipment used: Rolling walker (2 wheeled) Transfers: Sit to/from Stand Sit to Stand: Supervision  General transfer comment: BUE assist to power up, increased time with wide BOS  Ambulation/Gait Ambulation/Gait assistance: Supervision Gait Distance (Feet): 50 Feet Assistive device: Rolling walker (2 wheeled) Gait Pattern/deviations: Step-through pattern;Decreased stride length;Trunk flexed;Wide base of support Gait velocity: decreased   General Gait Details: pt with wide BOS taking slow, steady  steps, maintains trunk flexed despite cues, minimal bil foot clearance, no LOB, dyspnea 2/4 on RA with SpO2 >92%   Stairs             Wheelchair Mobility    Modified Rankin (Stroke Patients Only)       Balance Overall balance assessment: Needs assistance Sitting-balance support: No upper extremity supported;Feet supported Sitting balance-Leahy Scale: Good  Standing balance support: Bilateral upper extremity supported;During functional activity Standing balance-Leahy Scale: Poor Standing balance comment: reliant on UE support         Cognition Arousal/Alertness: Awake/alert Behavior During Therapy: WFL for tasks assessed/performed Overall Cognitive Status: Within Functional Limits for tasks assessed        Exercises General Exercises - Lower Extremity Long Arc Quad: Seated;AROM;Strengthening;Both;10 reps Hip Flexion/Marching: Seated;AROM;Strengthening;Both;10 reps Toe Raises: Seated;AROM;Strengthening;Both;10 reps Heel Raises: Seated;AROM;Strengthening;Both;10 reps    General Comments        Pertinent Vitals/Pain Pain Assessment: No/denies pain    Home Living                      Prior Function            PT Goals (current goals can now be found in the care plan section) Acute Rehab PT Goals Patient Stated Goal: Reduce swelling, regain mobility and IND, return home PT Goal Formulation: With patient Time For Goal Achievement: 02/09/21 Potential to Achieve Goals: Fair Progress towards PT goals: Progressing toward goals    Frequency    Min 3X/week      PT Plan Current plan remains appropriate    Co-evaluation              AM-PAC PT "6 Clicks" Mobility   Outcome Measure  Help needed  turning from your back to your side while in a flat bed without using bedrails?: A Little Help needed moving from lying on your back to sitting on the side of a flat bed without using bedrails?: A Little Help needed moving to and from a bed to a  chair (including a wheelchair)?: A Little Help needed standing up from a chair using your arms (e.g., wheelchair or bedside chair)?: A Little Help needed to walk in hospital room?: A Little Help needed climbing 3-5 steps with a railing? : A Little 6 Click Score: 18    End of Session Equipment Utilized During Treatment: Gait belt Activity Tolerance: Patient tolerated treatment well;Patient limited by fatigue Patient left: in chair;with call bell/phone within reach;with chair alarm set Nurse Communication: Mobility status PT Visit Diagnosis: Muscle weakness (generalized) (M62.81);Difficulty in walking, not elsewhere classified (R26.2)     Time: 3832-9191 PT Time Calculation (min) (ACUTE ONLY): 19 min  Charges:  $Therapeutic Exercise: 8-22 mins                    [name] PT, DPT 01/30/21, 1:39 PM

## 2021-01-30 NOTE — Progress Notes (Addendum)
Progress Note  Patient Name: [name] Date of Encounter: 01/30/2021  [facility] Cardiologist: Candee Furbish, MD   Subjective   Patient states he is feeling OK. He is eating breakfast. He states he never knew he could lost this much weight. He had some chest discomfort last night while lying in bed, denied any heart palpitation, dizziness, skipped heart beat sensation.    Inpatient Medications    Scheduled Meds: . Chlorhexidine Gluconate Cloth  6 each Topical Daily  . folic acid  1 mg Oral Daily  . heparin  5,000 Units Subcutaneous Q8H  . metoprolol tartrate  12.5 mg Oral BID  . multivitamin with minerals  1 tablet Oral Daily  . sodium chloride flush  3 mL Intravenous Q12H  . thiamine  100 mg Oral Daily   Or  . thiamine  100 mg Intravenous Daily  . torsemide  40 mg Oral Daily   Continuous Infusions: . sodium chloride     PRN Meds: sodium chloride, acetaminophen, sodium chloride flush   Vital Signs    Vitals:   01/30/21 0500 01/30/21 0508 01/30/21 0755 01/30/21 0803  BP:  (!) 88/62 (!) 92/59 (!) 100/58  Pulse:  90 88   Resp:  17 20   Temp:  98.5 F (36.9 C) 98.2 F (36.8 C)   TempSrc:      SpO2:  100% 100%   Weight: 83.2 kg       Intake/Output Summary (Last 24 hours) at 01/30/2021 0813 Last data filed at 01/30/2021 0500 Gross per 24 hour  Intake 960 ml  Output 1225 ml  Net -265 ml   Last 3 Weights 01/30/2021 01/29/2021 01/27/2021  Weight (lbs) 183 lb 6.8 oz 181 lb 195 lb 1.7 oz  Weight (kg) 83.2 kg 82.1 kg 88.5 kg  Some encounter information is confidential and restricted. Go to Review Flowsheets activity to see all data.      Telemetry    Sinus rhythm with tachycardia 90- 110s with occasional PVCs, 7 runs NSVTs at 2300 and 23 runs of NSVT 0229 noted - Personally Reviewed  ECG    No new tracing today - Personally Reviewed  Physical Exam   GEN: No acute distress. Oral cavity moist.   Neck: JVD 5cm  Cardiac: RRR, + murmur LSB Respiratory: Clear  to auscultation bilaterally. On room air  GI: Decreased distention of firm abdomen  MS: Anasarca with pitting edema of bilateral feet up to scrotum, much improved/resolving, ACE wrap of BLE remains  Neuro:  Nonfocal  Psych: Normal affect   Labs    High Sensitivity Troponin:   Recent Labs  Lab 01/23/21 2146 01/24/21 0047  TROPONINIHS 41* 42*      Chemistry Recent Labs  Lab 01/23/21 2146 01/25/21 0448 01/28/21 0441 01/29/21 0441 01/30/21 0436  NA 138   < > 136 137 136  K 4.3   < > 4.0 3.9 4.3  CL 107   < > 101 101 100  CO2 20*   < > 27 29 30   GLUCOSE 104*   < > 89 97 109*  BUN 66*   < > 56* 60* 63*  CREATININE 2.67*   < > 2.37* 2.46* 2.42*  CALCIUM 8.7*   < > 8.0* 8.2* 8.2*  PROT 9.0*  --   --   --   --   ALBUMIN 2.7*  --   --   --   --   AST 15  --   --   --   --  ALT 7  --   --   --   --   ALKPHOS 55  --   --   --   --   BILITOT 0.8  --   --   --   --   GFRNONAA 24*   < > 28* 26* 27*  ANIONGAP 11   < > 8 7 6    < > = values in this interval not displayed.     Hematology Recent Labs  Lab 01/27/21 0413 01/28/21 0441 01/30/21 0436  WBC 4.7 5.1 4.9  RBC 2.65* 2.94* 2.78*  HGB 7.0* 7.8* 7.4*  HCT 23.5* 26.2* 24.8*  MCV 88.7 89.1 89.2  MCH 26.4 26.5 26.6  MCHC 29.8* 29.8* 29.8*  RDW 19.2* 19.2* 20.0*  PLT 231 264 288    BNP Recent Labs  Lab 01/23/21 2146  BNP 2,820.0*     DDimer No results for input(s): DDIMER in the last 168 hours.   Radiology    No results found.  Cardiac Studies   Echo from 10/14/20:  1. No left ventricular thrombus with Definity contrast. Left ventricular  ejection fraction, by estimation, is <20%. The left ventricle has severely  decreased function. The left ventricle demonstrates global hypokinesis.  The left ventricular internal cavity size was severely dilated.  2. Right ventricular systolic function is moderately reduced. The right  ventricular size is severely enlarged. There is moderately elevated  pulmonary  artery systolic pressure. The estimated right ventricular  systolic pressure is 67.2 mmHg.  3. Left atrial size was severely dilated.  4. Right atrial size was severely dilated.  5. The mitral valve is normal in structure. Mild mitral valve  regurgitation.  6. The aortic valve is tricuspid. Aortic valve regurgitation is not  visualized. Mild aortic valve sclerosis is present, with no evidence of  aortic valve stenosis.   Comparison(s): Prior images reviewed side by side. No thrombus is seen in  the left ventricle with Definity contrast, on the current images.   Patient Profile     80 y.o. 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  Patient presented with dyspnea, anasarca in the setting of running out of torsemide.  Cardiology was consulted and following for decompensated heart failure.   Assessment & Plan    Acute on chronic systolic and diastolic biventricular heart failure End-stage dilated cardiomyopathy - History of dilated cardiomyopathy since 2013, had recurrent hospitalization for decompensated heart failure in the past, was seen by advanced heart failure [facility], has history of medication noncompliance and failure to follow-up - He was on Coreg 3.125mg  BID and torsemide 40 mg daily at home, ran out of torsemide before this admission, historically unable use ACEi/ARB/ARNI/SGLT2i/MRA due to advanced kidney disease  - Echo from 10/14/20 EF <20%, similar to 05/2019 Echo  - no ischemic workup in the past, presumed due to ETOH abuse  - Net -10.6 L and weight down from 202 >181>183 pounds since admission  - transition to home dose torsemide 40mg  daily today  - GDMT: Coreg held in the setting of volume  overload/low BP, started hydralazine 10mg  BID and isordil 10mg  BID this admission but BP not tolerating, discontinued Coreg and started metoprolol 12.5mg  BID given NSVTs and tachycardia, BP overall low, will stop hydralazine/isordil today, unable use ACEi/ARB/ARNI/SGLT2i/MRA  due to advanced kidney disease  - continue thiamine and folic acid long term - goals of care per primary team/palliative care    NSVT - noted on telemetry WITH 23 runs of NSVT this morning  - Coreg has been held since admission, BP low, started metoprolol 12.5mg  BID, may up-titrate if BP allows   Alcoholic cirrhosis with recurrent ascites History of EtOH abuse Hx of recurrent GI bleed  - patient reports stopped ETOH use for 1 yr - LFTs and bilirubin WNL POA, INR 1.2 - no clinical evidence of GI bleed, hepatic encephalopathy and SBP, clinically compensated cirrhosis  - abdomen imaging from 2020 showing cirrhosis morphology  - s/p paracentesis by IR on 01/27/21 with 4.3 L milky/cream colored fluid , no ascitic fluid analysis /cytology sent, consider evaluation of [place] and outpatient arrangement of routine paracentesis  - discussed absolute ETOH cessation   AKI on CKD stage IV Cardiorenal syndrome - Cr 2.67 POA, has been >2 over the past year, unclear if this is near baseline  - Cr downtrend from 2.7 to 2.37 with diuresis, anasarca improving, Cr 2.42 today,  monitor hepatorenal syndrome post paracentesis  - continue diuresis as above trend BMP daily    History of LV thrombus on Echo 2020 -Historically took Coumadin, this was stopped due to recurrent GI bleed, given high risk of bleeding and resolved thrombus on Echo 09/2020, no recommendation for therapeutic anticoagulation at this time   Hypertension - BP low 88/62 this morning, will stop  hydralazine and isordil,  transition to PO torsemide 40mg   today and continue low dose metoprolol 12.5mg  BID   Type 2 diabetes BPH Anemia of chronic disease  - management per  primary team    Will arrange appointment with advanced heart failure clinic in 2-3 weeks and he is scheduled with CHF heart impact clinic on 02/06/21 at 8 AM.   For questions or updates, please contact [facility] Please consult [email or web address] for contact info under    Signed, Margie Billet, NP  01/30/2021, 8:13 AM    History and all data above reviewed.  Patient examined.  I agree with the findings as above.  Hypotensive today as above. He denies pain although earlier today he had some chest pain. The patient exam reveals COR:RRR  ,  Lungs: Clear  ,  Abd: Positive bowel sounds, no rebound no guarding, Ext No edema  .  All available labs, radiology testing, previous records reviewed. Agree with documented assessment and plan.   Acute on chronic systolic HF:    He won't tolerate the hydral/nitrates.  I will reduce his Torsemide.  I think we will need some baseline dose but possibly not much if he will watch his weights, salt and fluid intake better than he has historically.    NSVT:  Unable to titrate meds and not an ICD candidate.     Jeneen Rinks Mikayah Joy  10:45 AM  01/30/2021

## 2021-01-30 NOTE — Progress Notes (Signed)
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  He was found to be severely volume overloaded on presentation and was started on IV Lasix.  Cardiology also consulted and following.  He could not be discharged today because his blood pressure was soft and he also had 5 runs of V. tach.  Plan for discharge tomorrow to home with home health when blood pressure improves.  Assessment & Plan:   Principal Problem:   CHF exacerbation ([place]) Active Problems:   Diabetes mellitus without complication ([place])   Alcohol abuse   Essential hypertension   CKD (chronic kidney disease), stage IV (HCC)   Pressure injury of skin   Volume overload/severe systolic congestive heart failure: Secondary to acute on chronic systolic  heart failure/congestive cardiomyopathy.  Anasarca also contributed by liver cirrhosis/hypoalbuminemia.  Echocardiogram showed ejection fraction of 10-20% Cardiology following.  Started on IV diuretics,now on torsemide 20 mg daily.  Continue to monitor input/output, daily weight. Stopped  hydralazine, Imdur because of low blood pressure C.ontnue compression wraps for bilateral lower extremity  edema  Ascites: History of liver cirrhosis from alcohol.  Might also have been contributed by congestive hepatopathy.  Status post paracentesis with removal of 7 L of fluid.  Has been getting  paracentesis once a month for the last 3 months.His abdomen looked distended again. He will definitely need recurrent paracentesis in the future.  He also needs to follow up with GI as an outpatient.  Underwent another session of paracentesis on 5/31 with removal of 4.3 L of fluid. We have reached to [facility]  gastroenterology to arrange a follow-up appointment as an outpatient so that he can be referred for recurrent  paracentesis in the future.They will set up outpatient appointment.  Left ventricular thrombus: Not a candidate for anticoagulation due to noncompliance, GI bleed  CKD stage IV: Currently kidney function at baseline ,kidney function improved  with diuresis  Normocytic anemia: Hemoglobin is in the range of 7 today. Anemia is most likely associated with CKD.  No evidence of acute blood loss. he has history of GI bleed.  Normal iron as per  iron studies.  We will transfuse if hemoglobin drops less than 7.  Severe BPH/chronic bladder outlet obstruction: On chronic indwelling Foley catheter.  Changed monthly.  Chronic alcoholism: Reports quitting drinking 6 months ago, not sure if this is true.  No signs of withdrawal.  Continue monitoring for withdrawal.  Continue thiamine, folic acid.  Chronic bilateral lower extremity wound: Follows at wound care center,Dr Dellia Nims.  Wound care consulted here  Debility/deconditioning/goals of care: Palliative care also following during this hospitalization and recommend palliative care follow-up as an outpatient when he is discharged.  PT/OT recommended home health on discharge.         DVT prophylaxis:Heparin [place] Code Status: DNR Family Communication: Called sister for update on  01/27/21 Status is: Inpatient  Remains inpatient appropriate because:Inpatient  level of care appropriate due to severity of illness   Dispo: The patient is from: Home              Anticipated d/c is to: Home tomorrow              Patient currently is not medically stable to d/c.   Difficult to place patient No     Consultants: Cardiology  Procedures:Paracentesis  Antimicrobials:  Anti-infectives (From admission, onward)   None      Subjective:  Patient seen and examined the bedside this morning.  Blood pressure was soft today so some of the blood pressure medications had to be stopped.  He was also having some dizziness.  Discharge canceled.  Objective: Vitals:   01/30/21 0500 01/30/21 0508 01/30/21 0755 01/30/21 0803  BP:  (!) 88/62 (!) 92/59 (!) 100/58  Pulse:  90 88   Resp:  17 20   Temp:  98.5 F (36.9 C) 98.2 F (36.8 C)   TempSrc:      SpO2:  100% 100%   Weight: 83.2 kg       Intake/Output Summary (Last 24 hours) at 01/30/2021 1257 Last data filed at 01/30/2021 0929 Gross per 24 hour  Intake 483 ml  Output 525 ml  Net -42 ml   Filed Weights   01/27/21 0500 01/29/21 0527 01/30/21 0500  Weight: 88.5 kg 82.1 kg 83.2 kg    Examination:    General exam: Overall comfortable, not in distress, chronically ill looking HEENT: PERRL Respiratory system:  no wheezes or crackles  Cardiovascular system: S1 & S2 heard, RRR.  Gastrointestinal system: Abdomen is static, soft and nontender. Central nervous system: Alert and oriented Extremities: Trace bilateral lower extremity edema, no clubbing ,no cyanosis Skin: Ulcers on bilateral lower extremities wrapped with dressings  Data Reviewed: I have personally reviewed following labs and imaging studies  CBC: Recent Labs  Lab 01/23/21 2146 01/27/21 0413 01/28/21 0441 01/30/21 0436  WBC 3.9* 4.7 5.1 4.9  NEUTROABS 3.2 3.6 4.0 3.8  HGB 8.5* 7.0* 7.8* 7.4*  HCT 28.6* 23.5* 26.2* 24.8*  MCV 88.8 88.7 89.1 89.2  PLT 267 231 264 401   Basic Metabolic Panel: Recent Labs  Lab 01/24/21 0500  01/25/21 0448 01/26/21 0523 01/27/21 0413 01/27/21 2317 01/28/21 0441 01/29/21 0441 01/30/21 0430 01/30/21 0436  NA  --  138   < > 139 142 136 137  --  136  K  --  4.2   < > 4.0 3.8 4.0 3.9  --  4.3  CL  --  107   < > 105 104 101 101  --  100  CO2  --  26   < > 29 32 27 29  --  30  GLUCOSE  --  112*   < > 100* 142* 89 97  --  109*  BUN  --  69*   < > 62* 60* 56* 60*  --  63*  CREATININE  --  2.70*   < > 2.42* 2.70* 2.37* 2.46*  --  2.42*  CALCIUM  --  8.2*   < > 8.1* 8.4* 8.0* 8.2*  --  8.2*  MG 1.9 2.0  --   --  2.1  --   --  1.9  --   PHOS 3.1  --   --   --   --   --   --   --   --    < > =  values in this interval not displayed.   GFR: Estimated Creatinine Clearance: 27.2 mL/min (A) (by C-G formula based on SCr of 2.42 mg/dL (H)). Liver Function Tests: Recent Labs  Lab 01/23/21 2146  AST 15  ALT 7  ALKPHOS 55  BILITOT 0.8  PROT 9.0*  ALBUMIN 2.7*   No results for input(s): LIPASE, AMYLASE in the last 168 hours. No results for input(s): AMMONIA in the last 168 hours. Coagulation Profile: Recent Labs  Lab 01/27/21 1627  INR 1.2   Cardiac Enzymes: No results for input(s): CKTOTAL, CKMB, CKMBINDEX, TROPONINI in the last 168 hours. BNP (last 3 results) No results for input(s): PROBNP in the last 8760 hours. HbA1C: No results for input(s): HGBA1C in the last 72 hours. CBG: No results for input(s): GLUCAP in the last 168 hours. Lipid Profile: No results for input(s): CHOL, HDL, LDLCALC, TRIG, CHOLHDL, LDLDIRECT in the last 72 hours. Thyroid Function Tests: No results for input(s): TSH, T4TOTAL, FREET4, T3FREE, THYROIDAB in the last 72 hours. Anemia Panel: No results for input(s): VITAMINB12, FOLATE, FERRITIN, TIBC, IRON, RETICCTPCT in the last 72 hours. Sepsis Labs: No results for input(s): PROCALCITON, LATICACIDVEN in the last 168 hours.  Recent Results (from the past 240 hour(s))  Resp Panel by RT-PCR (Flu A&B, Covid) Nasopharyngeal Swab     Status: None    Collection Time: 01/23/21 10:20 PM   Specimen: Nasopharyngeal Swab; Nasopharyngeal(NP) swabs in vial transport medium  Result Value Ref Range Status   SARS Coronavirus 2 by RT PCR NEGATIVE NEGATIVE Final    Comment: (NOTE) SARS-CoV-2 target nucleic acids are NOT DETECTED.  The SARS-CoV-2 RNA is generally detectable in upper respiratory specimens during the acute phase of infection. The lowest concentration of SARS-CoV-2 viral copies this assay can detect is 138 copies/mL. A negative result does not preclude SARS-Cov-2 infection and should not be used as the sole basis for treatment or other patient management decisions. A negative result may occur with  improper specimen collection/handling, submission of specimen other than nasopharyngeal swab, presence of viral mutation(s) within the areas targeted by this assay, and inadequate number of viral copies(<138 copies/mL). A negative result must be combined with clinical observations, patient history, and epidemiological information. The expected result is Negative.  Fact Sheet for Patients:  EntrepreneurPulse.com.au  Fact Sheet for Healthcare Providers:  IncredibleEmployment.be  This test is no t yet approved or cleared by the [place] FDA and  has been authorized for detection and/or diagnosis of SARS-CoV-2 by FDA under an Emergency Use Authorization (EUA). This EUA will remain  in effect (meaning this test can be used) for the duration of the COVID-19 declaration under Section 564(b)(1) of the Act, 21 U.S.C.section 360bbb-3(b)(1), unless the authorization is terminated  or revoked sooner.       Influenza A by PCR NEGATIVE NEGATIVE Final   Influenza B by PCR NEGATIVE NEGATIVE Final    Comment: (NOTE) The Xpert Xpress SARS-CoV-2/FLU/RSV plus assay is intended as an aid in the diagnosis of influenza from Nasopharyngeal swab specimens and should not be used as a sole basis for treatment.  Nasal washings and aspirates are unacceptable for Xpert Xpress SARS-CoV-2/FLU/RSV testing.  Fact Sheet for Patients: EntrepreneurPulse.com.au  Fact Sheet for Healthcare Providers: IncredibleEmployment.be  This test is not yet approved or cleared by the [place] FDA and has been authorized for detection and/or diagnosis of SARS-CoV-2 by FDA under an Emergency Use Authorization (EUA). This EUA will remain in effect (meaning this test can be used) for the  duration of the COVID-19 declaration under Section 564(b)(1) of the Act, 21 U.S.C. section 360bbb-3(b)(1), unless the authorization is terminated or revoked.  Performed at [facility], [place] [address] [place], [place] [address]          Radiology Studies: No results found.      Scheduled Meds: . Chlorhexidine Gluconate Cloth  6 each Topical Daily  . folic acid  1 mg Oral Daily  . heparin  5,000 Units Subcutaneous Q8H  . metoprolol tartrate  12.5 mg Oral BID  . multivitamin with minerals  1 tablet Oral Daily  . sodium chloride flush  3 mL Intravenous Q12H  . thiamine  100 mg Oral Daily   Or  . thiamine  100 mg Intravenous Daily  . [START ON 01/31/2021] torsemide  20 mg Oral Daily   Continuous Infusions: . sodium chloride       LOS: 6 days    Time spent: 25 mins.More than 50% of that time was spent in counseling and/or coordination of care.      Shelly Coss, MD [facility] P6/10/2020, 12:57 PM

## 2021-01-31 DIAGNOSIS — I472 Ventricular tachycardia: Secondary | ICD-10-CM | POA: Diagnosis not present

## 2021-01-31 DIAGNOSIS — N179 Acute kidney failure, unspecified: Secondary | ICD-10-CM | POA: Diagnosis not present

## 2021-01-31 DIAGNOSIS — I5043 Acute on chronic combined systolic (congestive) and diastolic (congestive) heart failure: Secondary | ICD-10-CM | POA: Diagnosis not present

## 2021-01-31 MED ORDER — MIDODRINE HCL 5 MG PO TABS
5.0000 mg | ORAL_TABLET | Freq: Three times a day (TID) | ORAL | Status: DC
  Administered 2021-01-31 – 2021-02-01 (×3): 5 mg via ORAL
  Filled 2021-01-31 (×3): qty 1

## 2021-01-31 MED ORDER — CALCIUM CARBONATE ANTACID 500 MG PO CHEW
1.0000 | CHEWABLE_TABLET | Freq: Three times a day (TID) | ORAL | Status: DC
  Administered 2021-01-31 – 2021-02-10 (×26): 200 mg via ORAL
  Filled 2021-01-31 (×29): qty 1

## 2021-01-31 NOTE — Progress Notes (Signed)
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  He was found to be severely volume overloaded on presentation and was started on IV Lasix.  Cardiology also consulted and following.  He could not be discharged today because his blood pressure is soft and he also had some left-sided chest pain this morning.  Plan for discharge tomorrow to home with home health when blood pressure improves.  Does not feel ready to go home yet.  Assessment & Plan:   Principal Problem:   CHF exacerbation ([place]) Active Problems:   Diabetes mellitus without complication ([place])   Alcohol abuse   Essential hypertension   CKD (chronic kidney disease), stage IV (HCC)   Pressure injury of skin   Volume overload/severe systolic congestive heart failure: Secondary to acute on chronic systolic  heart failure/congestive cardiomyopathy.  Anasarca also contributed by liver cirrhosis/hypoalbuminemia.  Echocardiogram showed ejection fraction of 10-20% Cardiology following.  Started on IV diuretics,now on torsemide 20 mg daily.  Continue to monitor input/output, daily weight. Stopped  hydralazine, Imdur because of low blood pressure  .Contnue compression wraps for bilateral lower extremity edema  Ascites: History of liver cirrhosis from alcohol.  Might also have been contributed by congestive hepatopathy.  Status post paracentesis with removal of 7 L of fluid.  Has been getting  paracentesis once a month for the last 3 months.His abdomen looked distended again. He will definitely need recurrent paracentesis in the future.  He also needs to follow up with GI as an outpatient.  Underwent another session of paracentesis on 5/31 with removal of 4.3 L of fluid. We have reached to [facility]  gastroenterology to arrange a follow-up appointment as an outpatient so that he can be referred for recurrent  paracentesis in the future.he has an an appointment with Vicie Mutters on 6/16 at 9: 30  Hypotension: Imdur, hydralazine discontinued.  Patient remains hypotensive.  Started on midodrine.  Chest pain: Currently resolved.  He had left-sided chest pain this morning.  EKG did not show any acute ischemic changes.  Continue monitoring  Left ventricular thrombus: Not a candidate for anticoagulation due to noncompliance, GI bleed  CKD stage IV: Currently kidney function at baseline ,kidney function improved  with diuresis  Normocytic anemia: Hemoglobin is in the range of 7. Anemia is most likely associated with CKD.  No evidence of acute blood loss. he has history of GI bleed.  Normal iron as per  iron studies.  We will transfuse if hemoglobin drops less than 7.  Severe BPH/chronic bladder outlet obstruction: On chronic indwelling Foley catheter.  Changed monthly.  Chronic alcoholism: Reports quitting drinking 6 months ago, not sure if this is true.  No signs of withdrawal.  Continue monitoring for withdrawal.  Continue thiamine, folic acid.  Chronic bilateral lower extremity wound: Follows at wound care center,Dr Dellia Nims.  Wound  care consulted here  Debility/deconditioning/goals of care: Palliative care also following during this  hospitalization and recommend palliative care follow-up as an outpatient when he is discharged.  PT/OT recommended home health on discharge.         DVT prophylaxis:Heparin [place] Code Status: DNR Family Communication: Called sister for update on 01/27/21 Status is: Inpatient  Remains inpatient appropriate because:Inpatient level of care appropriate due to severity of illness   Dispo: The patient is from: Home              Anticipated d/c is to: Home tomorrow              Patient currently is not medically stable to d/c.   Difficult to place patient No     Consultants: Cardiology  Procedures:Paracentesis  Antimicrobials:  Anti-infectives (From admission, onward)   None      Subjective:  Patient seen and examined at the bedside this morning.  Appears weak.  Says he cannot walk and he also had some left-sided chest pain today.  During my evaluation he was chest pain-free. Objective: Vitals:   01/30/21 2057 01/30/21 2255 01/31/21 0445 01/31/21 1040  BP: 103/60 109/64 97/60 105/75  Pulse: 98 99 93 90  Resp: 18  20 18   Temp: 98.5 F (36.9 C)  98.5 F (36.9 C) 98.2 F (36.8 C)  TempSrc: Oral  Oral Oral  SpO2: 98%  100%   Weight:        Intake/Output Summary (Last 24 hours) at 01/31/2021 1135 Last data filed at 01/31/2021 0958 Gross per 24 hour  Intake 480 ml  Output 1325 ml  Net -845 ml   Filed Weights   01/27/21 0500 01/29/21 0527 01/30/21 0500  Weight: 88.5 kg 82.1 kg 83.2 kg    Examination:    General exam: Very deconditioned debilitated, weak HEENT: PERRL Respiratory system:  no wheezes or crackles  Cardiovascular system: S1 & S2 heard, RRR.  Gastrointestinal system: Abdomen is distended, soft and nontender.  Ascites Central nervous system: Alert and oriented Extremities: No edema, no clubbing ,no cyanosis, bilateral lower extremity wrapped with dressings Skin: Ulcers on bilateral lower extremities  Data Reviewed: I have personally reviewed following  labs and imaging studies  CBC: Recent Labs  Lab 01/27/21 0413 01/28/21 0441 01/30/21 0436  WBC 4.7 5.1 4.9  NEUTROABS 3.6 4.0 3.8  HGB 7.0* 7.8* 7.4*  HCT 23.5* 26.2* 24.8*  MCV 88.7 89.1 89.2  PLT 231 264 703   Basic Metabolic Panel: Recent Labs  Lab 01/25/21 0448 01/26/21 0523 01/27/21 0413 01/27/21 2317 01/28/21 0441 01/29/21 0441 01/30/21 0430 01/30/21 0436  NA 138   < > 139 142 136 137  --  136  K 4.2   < > 4.0 3.8 4.0 3.9  --  4.3  CL 107   < > 105 104 101 101  --  100  CO2 26   < > 29 32 27 29  --  30  GLUCOSE 112*   < > 100* 142* 89 97  --  109*  BUN 69*   < > 62* 60* 56* 60*  --  63*  CREATININE 2.70*   < > 2.42* 2.70* 2.37* 2.46*  --  2.42*  CALCIUM 8.2*   < > 8.1* 8.4* 8.0* 8.2*  --  8.2*  MG 2.0  --   --  2.1  --   --  1.9  --    < > = values in this interval not displayed.  GFR: Estimated Creatinine Clearance: 27.2 mL/min (A) (by C-G formula based on SCr of 2.42 mg/dL (H)). Liver Function Tests: No results for input(s): AST, ALT, ALKPHOS, BILITOT, PROT, ALBUMIN in the last 168 hours. No results for input(s): LIPASE, AMYLASE in the last 168 hours. No results for input(s): AMMONIA in the last 168 hours. Coagulation Profile: Recent Labs  Lab 01/27/21 1627  INR 1.2   Cardiac Enzymes: No results for input(s): CKTOTAL, CKMB, CKMBINDEX, TROPONINI in the last 168 hours. BNP (last 3 results) No results for input(s): PROBNP in the last 8760 hours. HbA1C: No results for input(s): HGBA1C in the last 72 hours. CBG: No results for input(s): GLUCAP in the last 168 hours. Lipid Profile: No results for input(s): CHOL, HDL, LDLCALC, TRIG, CHOLHDL, LDLDIRECT in the last 72 hours. Thyroid Function Tests: No results for input(s): TSH, T4TOTAL, FREET4, T3FREE, THYROIDAB in the last 72 hours. Anemia Panel: No results for input(s): VITAMINB12, FOLATE, FERRITIN, TIBC, IRON, RETICCTPCT in the last 72 hours. Sepsis Labs: No results for input(s): PROCALCITON,  LATICACIDVEN in the last 168 hours.  Recent Results (from the past 240 hour(s))  Resp Panel by RT-PCR (Flu A&B, Covid) Nasopharyngeal Swab     Status: None   Collection Time: 01/23/21 10:20 PM   Specimen: Nasopharyngeal Swab; Nasopharyngeal(NP) swabs in vial transport medium  Result Value Ref Range Status   SARS Coronavirus 2 by RT PCR NEGATIVE NEGATIVE Final    Comment: (NOTE) SARS-CoV-2 target nucleic acids are NOT DETECTED.  The SARS-CoV-2 RNA is generally detectable in upper respiratory specimens during the acute phase of infection. The lowest concentration of SARS-CoV-2 viral copies this assay can detect is 138 copies/mL. A negative result does not preclude SARS-Cov-2 infection and should not be used as the sole basis for treatment or other patient management decisions. A negative result may occur with  improper specimen collection/handling, submission of specimen other than nasopharyngeal swab, presence of viral mutation(s) within the areas targeted by this assay, and inadequate number of viral copies(<138 copies/mL). A negative result must be combined with clinical observations, patient history, and epidemiological information. The expected result is Negative.  Fact Sheet for Patients:  EntrepreneurPulse.com.au  Fact Sheet for Healthcare Providers:  IncredibleEmployment.be  This test is no t yet approved or cleared by the [place] FDA and  has been authorized for detection and/or diagnosis of SARS-CoV-2 by FDA under an Emergency Use Authorization (EUA). This EUA will remain  in effect (meaning this test can be used) for the duration of the COVID-19 declaration under Section 564(b)(1) of the Act, 21 U.S.C.section 360bbb-3(b)(1), unless the authorization is terminated  or revoked sooner.       Influenza A by PCR NEGATIVE NEGATIVE Final   Influenza B by PCR NEGATIVE NEGATIVE Final    Comment: (NOTE) The Xpert Xpress  SARS-CoV-2/FLU/RSV plus assay is intended as an aid in the diagnosis of influenza from Nasopharyngeal swab specimens and should not be used as a sole basis for treatment. Nasal washings and aspirates are unacceptable for Xpert Xpress SARS-CoV-2/FLU/RSV testing.  Fact Sheet for Patients: EntrepreneurPulse.com.au  Fact Sheet for Healthcare Providers: IncredibleEmployment.be  This test is not yet approved or cleared by the [place] FDA and has been authorized for detection and/or diagnosis of SARS-CoV-2 by FDA under an Emergency Use Authorization (EUA). This EUA will remain in effect (meaning this test can be used) for the duration of the COVID-19 declaration under Section 564(b)(1) of the Act, 21 U.S.C. section 360bbb-3(b)(1), unless the authorization is  terminated or revoked.  Performed at [facility], [place] [address], [place], [place] [address]          Radiology Studies: No results found.      Scheduled Meds: . Chlorhexidine Gluconate Cloth  6 each Topical Daily  . folic acid  1 mg Oral Daily  . heparin  5,000 Units Subcutaneous Q8H  . metoprolol tartrate  12.5 mg Oral BID  . midodrine  5 mg Oral TID WC  . multivitamin with minerals  1 tablet Oral Daily  . sodium chloride flush  3 mL Intravenous Q12H  . thiamine  100 mg Oral Daily   Or  . thiamine  100 mg Intravenous Daily  . torsemide  20 mg Oral Daily   Continuous Infusions: . sodium chloride       LOS: 7 days    Time spent: 25 mins.More than 50% of that time was spent in counseling and/or coordination of care.      Shelly Coss, MD [facility] P6/11/2020, 11:35 AM

## 2021-01-31 NOTE — Progress Notes (Signed)
   01/31/21 0244  ECG Monitoring  CV Strip Heart Rate 102  Ectopy Ventricular tachycardia, non-sustained;Other (Comment) (9 BT/FUSION BEAT N/N Donalee Gaumond RN TVR CMT)    RN assessed patient. Patient was asymptomatic. Patient is currently sleeping. Currently NSR.

## 2021-01-31 NOTE — Progress Notes (Signed)
Progress Note  Patient Name: [name] Date of Encounter: 01/31/2021  [facility] Cardiologist: Candee Furbish, MD   Subjective   BP 97/60 this AM.  Switch to p.o. torsemide,net negative 440 cc yesterday, -11 L on admission.  Weight is down 20 pounds.  Stable kidney function (2.7 > 2.4 > 2.5 > 2.4).  Reported episode of chest pain this morning, lasting less than 1 minute.  Currently chest pain free.  Reports dyspnea improved.  Inpatient Medications    Scheduled Meds: . Chlorhexidine Gluconate Cloth  6 each Topical Daily  . folic acid  1 mg Oral Daily  . heparin  5,000 Units Subcutaneous Q8H  . metoprolol tartrate  12.5 mg Oral BID  . multivitamin with minerals  1 tablet Oral Daily  . sodium chloride flush  3 mL Intravenous Q12H  . thiamine  100 mg Oral Daily   Or  . thiamine  100 mg Intravenous Daily  . torsemide  20 mg Oral Daily   Continuous Infusions: . sodium chloride     PRN Meds: sodium chloride, acetaminophen, sodium chloride flush   Vital Signs    Vitals:   01/30/21 1942 01/30/21 2057 01/30/21 2255 01/31/21 0445  BP: 115/74 103/60 109/64 97/60  Pulse: 99 98 99 93  Resp:  18  20  Temp:  98.5 F (36.9 C)  98.5 F (36.9 C)  TempSrc:  Oral  Oral  SpO2: 100% 98%  100%  Weight:        Intake/Output Summary (Last 24 hours) at 01/31/2021 0940 Last data filed at 01/31/2021 0755 Gross per 24 hour  Intake 240 ml  Output 1175 ml  Net -935 ml   Last 3 Weights 01/30/2021 01/29/2021 01/27/2021  Weight (lbs) 183 lb 6.8 oz 181 lb 195 lb 1.7 oz  Weight (kg) 83.2 kg 82.1 kg 88.5 kg  Some encounter information is confidential and restricted. Go to Review Flowsheets activity to see all data.      Telemetry    Sinus rhythm with PVCs.  Had a 12 beat in 9 beat run of NSVT overnight.- Personally Reviewed  ECG    No new tracing today - Personally Reviewed  Physical Exam   GEN: No acute distress. Oral cavity moist.   Neck: No JVD Cardiac: RRR, 2/6 systolic  murmur Respiratory: Clear to auscultation bilaterally. On room air  GI: Soft MS: ACE wrap of BLE  Neuro:  Nonfocal  Psych: Normal affect   Labs    High Sensitivity Troponin:   Recent Labs  Lab 01/23/21 2146 01/24/21 0047  TROPONINIHS 41* 42*      Chemistry Recent Labs  Lab 01/28/21 0441 01/29/21 0441 01/30/21 0436  NA 136 137 136  K 4.0 3.9 4.3  CL 101 101 100  CO2 27 29 30   GLUCOSE 89 97 109*  BUN 56* 60* 63*  CREATININE 2.37* 2.46* 2.42*  CALCIUM 8.0* 8.2* 8.2*  GFRNONAA 28* 26* 27*  ANIONGAP 8 7 6      Hematology Recent Labs  Lab 01/27/21 0413 01/28/21 0441 01/30/21 0436  WBC 4.7 5.1 4.9  RBC 2.65* 2.94* 2.78*  HGB 7.0* 7.8* 7.4*  HCT 23.5* 26.2* 24.8*  MCV 88.7 89.1 89.2  MCH 26.4 26.5 26.6  MCHC 29.8* 29.8* 29.8*  RDW 19.2* 19.2* 20.0*  PLT 231 264 288    BNP No results for input(s): BNP, PROBNP in the last 168 hours.   DDimer No results for input(s): DDIMER in the last 168 hours.  Radiology    No results found.  Cardiac Studies   Echo from 10/14/20:  1. No left ventricular thrombus with Definity contrast. Left ventricular  ejection fraction, by estimation, is <20%. The left ventricle has severely  decreased function. The left ventricle demonstrates global hypokinesis.  The left ventricular internal cavity size was severely dilated.  2. Right ventricular systolic function is moderately reduced. The right  ventricular size is severely enlarged. There is moderately elevated  pulmonary artery systolic pressure. The estimated right ventricular  systolic pressure is 51.8 mmHg.  3. Left atrial size was severely dilated.  4. Right atrial size was severely dilated.  5. The mitral valve is normal in structure. Mild mitral valve  regurgitation.  6. The aortic valve is tricuspid. Aortic valve regurgitation is not  visualized. Mild aortic valve sclerosis is present, with no evidence of  aortic valve stenosis.   Comparison(s): Prior images  reviewed side by side. No thrombus is seen in  the left ventricle with Definity contrast, on the current images.   Patient Profile     80 y.o. 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  Patient presented with dyspnea, anasarca in the setting of running out of torsemide.  Cardiology was consulted and following for decompensated heart failure.   Assessment & Plan    Acute on chronic systolic and diastolic biventricular heart failure End-stage dilated cardiomyopathy - History of dilated cardiomyopathy since 2013, had recurrent hospitalization for decompensated heart failure in the past, was seen by advanced heart failure at one-point, has history of medication noncompliance and failure to follow-up - He was on Coreg 3.125mg  BID and torsemide 40 mg daily at home, ran out of torsemide before this admission, historically unable use ACEi/ARB/ARNI/SGLT2i/MRA due to advanced kidney disease  - Echo from 10/14/20 EF <20%, similar to 05/2019 Echo  - no ischemic workup in the past, presumed due to ETOH abuse  - Net -10.6 L and weight down from 202 >181>183 pounds since admission  - transition to PO torsemide 20mg  daily  - GDMT: Coreg held in the setting of volume overload/low BP, started hydralazine 10mg  BID and isordil 10mg  BID this admission but BP not tolerating, discontinued Coreg and started metoprolol 12.5mg  BID given NSVTs. BP overall low, stopped hydralazine/isordil, unable use ACEi/ARB/ARNI/SGLT2i/MRA due to advanced kidney disease  - continue thiamine and folic acid long term - goals of care per primary team/palliative care    NSVT - Coreg has been held since admission, BP low, started metoprolol 12.5mg  BID, may up-titrate if BP  allows   Alcoholic cirrhosis with recurrent ascites History of EtOH abuse Hx of recurrent GI bleed  - patient reports stopped ETOH use for 1 yr - LFTs and bilirubin WNL POA, INR 1.2 - no clinical evidence of GI bleed, hepatic encephalopathy and SBP, clinically compensated cirrhosis  - abdomen imaging from 2020 showing cirrhosis morphology  - s/p paracentesis by IR on 01/27/21 with 4.3 L milky/cream colored fluid , no ascitic fluid analysis /cytology sent, consider evaluation of [place] and outpatient arrangement of routine paracentesis  - discussed absolute ETOH cessation   AKI on CKD stage IV Cardiorenal syndrome - Cr 2.67 POA, has been >2 over the past year, unclear if this is near baseline  - Cr downtrend from 2.7 to  2.37 with diuresis, anasarca improving, Cr 2.42 today,  monitor hepatorenal syndrome post paracentesis  - continue diuresis as above trend BMP daily    History of LV thrombus on Echo 2020 -Historically took Coumadin, this was stopped due to recurrent GI bleed, given high risk of bleeding and resolved thrombus on Echo 09/2020, no recommendation for therapeutic anticoagulation at this time   Hypertension - BP low 88/62 yesterday, stopped hydralazine and isordil,  transition to PO torsemide and continue low dose metoprolol 12.5mg  BID   Type 2 diabetes BPH Anemia of chronic disease  - management per primary team    Disposition: Will arrange appointment with advanced heart failure clinic in 2-3 weeks and he is scheduled with CHF heart impact clinic on 02/06/21 at 8 AM.   For questions or updates, please contact [facility] Please consult [email or web address] for contact info under    Signed, Donato Heinz, MD  01/31/2021, 9:40 AM

## 2021-01-31 NOTE — Progress Notes (Signed)
   01/31/21 0445  Vitals  Temp 98.5 F (36.9 C)  Temp Source Oral  BP 97/60  MAP (mmHg) 71  BP Location Right Arm  BP Method Automatic  Patient Position (if appropriate) Sitting  Pulse Rate 93  Resp 20  MEWS COLOR  MEWS Score Color Green  Oxygen Therapy  SpO2 100 %  MEWS Score  MEWS Temp 0  MEWS Systolic 1  MEWS Pulse 0  MEWS RR 0  MEWS LOC 0  MEWS Score 1  Reported from Monitor Room, patient had a 11 beat run of Vtach. Patient was asymptomatic. NT was in the room taking vitals listed above at the time of incident. Provider paged and made aware.

## 2021-02-01 DIAGNOSIS — I5043 Acute on chronic combined systolic (congestive) and diastolic (congestive) heart failure: Secondary | ICD-10-CM | POA: Diagnosis not present

## 2021-02-01 DIAGNOSIS — I472 Ventricular tachycardia: Secondary | ICD-10-CM | POA: Diagnosis not present

## 2021-02-01 DIAGNOSIS — N179 Acute kidney failure, unspecified: Secondary | ICD-10-CM | POA: Diagnosis not present

## 2021-02-01 LAB — CBC WITH DIFFERENTIAL/PLATELET
Abs Immature Granulocytes: 0.02 10*3/uL (ref 0.00–0.07)
Basophils Absolute: 0.1 10*3/uL (ref 0.0–0.1)
Basophils Relative: 1 %
Eosinophils Absolute: 0.1 10*3/uL (ref 0.0–0.5)
Eosinophils Relative: 2 %
HCT: 26.2 % — ABNORMAL LOW (ref 39.0–52.0)
Hemoglobin: 7.7 g/dL — ABNORMAL LOW (ref 13.0–17.0)
Immature Granulocytes: 0 %
Lymphocytes Relative: 8 %
Lymphs Abs: 0.4 10*3/uL — ABNORMAL LOW (ref 0.7–4.0)
MCH: 26.6 pg (ref 26.0–34.0)
MCHC: 29.4 g/dL — ABNORMAL LOW (ref 30.0–36.0)
MCV: 90.3 fL (ref 80.0–100.0)
Monocytes Absolute: 0.5 10*3/uL (ref 0.1–1.0)
Monocytes Relative: 9 %
Neutro Abs: 4.5 10*3/uL (ref 1.7–7.7)
Neutrophils Relative %: 80 %
Platelets: 291 10*3/uL (ref 150–400)
RBC: 2.9 MIL/uL — ABNORMAL LOW (ref 4.22–5.81)
RDW: 21.1 % — ABNORMAL HIGH (ref 11.5–15.5)
WBC: 5.5 10*3/uL (ref 4.0–10.5)
nRBC: 0 % (ref 0.0–0.2)

## 2021-02-01 LAB — BASIC METABOLIC PANEL
Anion gap: 8 (ref 5–15)
BUN: 75 mg/dL — ABNORMAL HIGH (ref 8–23)
CO2: 30 mmol/L (ref 22–32)
Calcium: 8.6 mg/dL — ABNORMAL LOW (ref 8.9–10.3)
Chloride: 98 mmol/L (ref 98–111)
Creatinine, Ser: 2.66 mg/dL — ABNORMAL HIGH (ref 0.61–1.24)
GFR, Estimated: 24 mL/min — ABNORMAL LOW (ref >60)
Glucose, Bld: 115 mg/dL — ABNORMAL HIGH (ref 70–99)
Potassium: 4.8 mmol/L (ref 3.5–5.1)
Sodium: 136 mmol/L (ref 135–145)

## 2021-02-01 LAB — MAGNESIUM: Magnesium: 2.4 mg/dL (ref 1.7–2.4)

## 2021-02-01 MED ORDER — MIDODRINE HCL 5 MG PO TABS
10.0000 mg | ORAL_TABLET | Freq: Three times a day (TID) | ORAL | Status: DC
  Administered 2021-02-01 – 2021-02-10 (×24): 10 mg via ORAL
  Filled 2021-02-01 (×27): qty 2

## 2021-02-01 MED ORDER — TORSEMIDE 20 MG PO TABS
40.0000 mg | ORAL_TABLET | Freq: Every day | ORAL | Status: DC
  Administered 2021-02-02: 40 mg via ORAL
  Filled 2021-02-01 (×2): qty 2

## 2021-02-01 MED ORDER — PHENAZOPYRIDINE HCL 100 MG PO TABS
100.0000 mg | ORAL_TABLET | Freq: Three times a day (TID) | ORAL | Status: DC
  Administered 2021-02-01 – 2021-02-02 (×2): 100 mg via ORAL
  Filled 2021-02-01 (×4): qty 1

## 2021-02-01 MED ORDER — TRAMADOL HCL 50 MG PO TABS
50.0000 mg | ORAL_TABLET | Freq: Once | ORAL | Status: AC | PRN
  Administered 2021-02-01: 50 mg via ORAL
  Filled 2021-02-01: qty 1

## 2021-02-01 NOTE — Progress Notes (Signed)
Pt complains of increased "spasam" pain at catheter site. Catheter balloon deflated and reinflated with minimal improvement. Bladder scanner reads 82ml. Foley is draining well. MD notified, see new orders.

## 2021-02-01 NOTE — Progress Notes (Signed)
Progress Note  Patient Name: [name] Date of Encounter: 02/01/2021  [facility] Cardiologist: Candee Furbish, MD   Subjective   BP 98/60 this AM.  On p.o. torsemide,net negative 440 cc yesterday, -12 L on admission.  Weight is down 20 pounds.  No labs this morning.  Reports intermittent dyspnea but denies any chest pain.  Inpatient Medications    Scheduled Meds: . calcium carbonate  1 tablet Oral TID WC  . Chlorhexidine Gluconate Cloth  6 each Topical Daily  . folic acid  1 mg Oral Daily  . heparin  5,000 Units Subcutaneous Q8H  . metoprolol tartrate  12.5 mg Oral BID  . midodrine  10 mg Oral TID WC  . multivitamin with minerals  1 tablet Oral Daily  . sodium chloride flush  3 mL Intravenous Q12H  . thiamine  100 mg Oral Daily   Or  . thiamine  100 mg Intravenous Daily  . torsemide  20 mg Oral Daily   Continuous Infusions: . sodium chloride     PRN Meds: sodium chloride, acetaminophen, sodium chloride flush   Vital Signs    Vitals:   01/31/21 2005 02/01/21 0344 02/01/21 0805 02/01/21 1022  BP: 108/66 (!) 89/56 115/67 98/60  Pulse: 89 91 90   Resp: 18     Temp: 98.6 F (37 C) 97.6 F (36.4 C)    TempSrc:  Oral    SpO2: 100% 100%    Weight:  82.3 kg      Intake/Output Summary (Last 24 hours) at 02/01/2021 1033 Last data filed at 02/01/2021 0351 Gross per 24 hour  Intake 320 ml  Output 600 ml  Net -280 ml   Last 3 Weights 02/01/2021 01/30/2021 01/29/2021  Weight (lbs) 181 lb 8 oz 183 lb 6.8 oz 181 lb  Weight (kg) 82.328 kg 83.2 kg 82.1 kg  Some encounter information is confidential and restricted. Go to Review Flowsheets activity to see all data.      Telemetry    Sinus rhythm with PVCs.  NSVT up to 9 beats.- Personally Reviewed  ECG    No new tracing today - Personally Reviewed  Physical Exam   GEN: No acute distress. Oral cavity moist.   Neck: + JVD Cardiac: RRR, 2/6 systolic murmur Respiratory: Bibasilar crackles GI: Soft MS: ACE wrap of  BLE  Neuro:  Nonfocal  Psych: Normal affect   Labs    High Sensitivity Troponin:   Recent Labs  Lab 01/23/21 2146 01/24/21 0047  TROPONINIHS 41* 42*      Chemistry Recent Labs  Lab 01/28/21 0441 01/29/21 0441 01/30/21 0436  NA 136 137 136  K 4.0 3.9 4.3  CL 101 101 100  CO2 27 29 30   GLUCOSE 89 97 109*  BUN 56* 60* 63*  CREATININE 2.37* 2.46* 2.42*  CALCIUM 8.0* 8.2* 8.2*  GFRNONAA 28* 26* 27*  ANIONGAP 8 7 6      Hematology Recent Labs  Lab 01/27/21 0413 01/28/21 0441 01/30/21 0436  WBC 4.7 5.1 4.9  RBC 2.65* 2.94* 2.78*  HGB 7.0* 7.8* 7.4*  HCT 23.5* 26.2* 24.8*  MCV 88.7 89.1 89.2  MCH 26.4 26.5 26.6  MCHC 29.8* 29.8* 29.8*  RDW 19.2* 19.2* 20.0*  PLT 231 264 288    BNP No results for input(s): BNP, PROBNP in the last 168 hours.   DDimer No results for input(s): DDIMER in the last 168 hours.   Radiology    No results found.  Cardiac Studies  Echo from 10/14/20:  1. No left ventricular thrombus with Definity contrast. Left ventricular  ejection fraction, by estimation, is <20%. The left ventricle has severely  decreased function. The left ventricle demonstrates global hypokinesis.  The left ventricular internal cavity size was severely dilated.  2. Right ventricular systolic function is moderately reduced. The right  ventricular size is severely enlarged. There is moderately elevated  pulmonary artery systolic pressure. The estimated right ventricular  systolic pressure is 27.7 mmHg.  3. Left atrial size was severely dilated.  4. Right atrial size was severely dilated.  5. The mitral valve is normal in structure. Mild mitral valve  regurgitation.  6. The aortic valve is tricuspid. Aortic valve regurgitation is not  visualized. Mild aortic valve sclerosis is present, with no evidence of  aortic valve stenosis.   Comparison(s): Prior images reviewed side by side. No thrombus is seen in  the left ventricle with Definity contrast, on  the current images.   Patient Profile     80 y.o. 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  Patient presented with dyspnea, anasarca in the setting of running out of torsemide.  Cardiology was consulted and following for decompensated heart failure.   Assessment & Plan    Acute on chronic systolic and diastolic biventricular heart failure End-stage dilated cardiomyopathy - History of dilated cardiomyopathy since 2013, had recurrent hospitalization for decompensated heart failure in the past, was seen by advanced heart failure at one-point, has history of medication noncompliance and failure to follow-up - He was on Coreg 3.125mg  BID and torsemide 40 mg daily at home, ran out of torsemide before this admission, historically unable use ACEi/ARB/ARNI/SGLT2i/MRA due to advanced kidney disease  - Echo from 10/14/20 EF <20%, similar to 05/2019 Echo  - no ischemic workup in the past, presumed due to ETOH abuse  - Net -12 L and weight down from 203 >182 pounds since admission  - transitioned to PO torsemide 20mg  daily, still appears mildly hypervolemic would recommend increasing to home torsemide 40 mg daily  - GDMT: Coreg held in the setting of volume overload/low BP, started hydralazine 10mg  BID and isordil 10mg  BID this admission but BP not tolerating, discontinued Coreg and started metoprolol 12.5mg  BID given NSVTs. BP overall low, stopped hydralazine/isordil, unable use ACEi/ARB/ARNI/SGLT2i/MRA due to advanced kidney disease  - goals of care per primary team/palliative care    NSVT - Coreg has been held since admission, BP low, started metoprolol 12.5mg  BID, may up-titrate if BP allows  -Maintain K>4, Mag>2.  No labs this  morning, will check  Alcoholic cirrhosis with recurrent ascites History of EtOH abuse Hx of recurrent GI bleed  - patient reports stopped ETOH use for 1 yr - LFTs and bilirubin WNL POA, INR 1.2 - no clinical evidence of GI bleed, hepatic encephalopathy and SBP, clinically compensated cirrhosis  - abdomen imaging from 2020 showing cirrhosis morphology  - s/p paracentesis by IR on 01/27/21 with 4.3 L milky/cream colored fluid , no ascitic fluid analysis /cytology sent, consider evaluation of [place] and outpatient arrangement of routine paracentesis  - discussed absolute ETOH cessation   AKI on CKD stage IV Cardiorenal syndrome - Cr 2.67 POA, has been >2 over the past year, unclear if this is near baseline  - Cr downtrend  from 2.7 to 2.37 with diuresis.  No labs last 2 days, will check BMET  History of LV thrombus on Echo 2020 -Historically took Coumadin, this was stopped due to recurrent GI bleed, given high risk of bleeding and resolved thrombus on Echo 09/2020, no recommendation for therapeutic anticoagulation at this time   Hypertension - BP low, stopped hydralazine and isordil,  transitioned to PO torsemide and continue low dose metoprolol 12.5mg  BID   Type 2 diabetes BPH Anemia of chronic disease  - management per primary team   Disposition: Will arrange appointment with advanced heart failure clinic in 2-3 weeks and he is scheduled with CHF heart impact clinic on 02/06/21 at 8 AM.    For questions or updates, please contact [facility] Please consult [email or web address] for contact info under    Signed, Donato Heinz, MD  02/01/2021, 10:33 AM

## 2021-02-01 NOTE — Progress Notes (Signed)
Pt with persistent pain at catheter site after tylenol given. On call provider paged for PRN pain medications and order to exchange foley catheter. Awaiting orders. Will continue to monitor.

## 2021-02-01 NOTE — Progress Notes (Signed)
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  He was found to be severely volume overloaded on presentation and was started on IV Lasix.  Cardiology also consulted and following.  He could not be discharged today because his blood pressure is soft ,we increased the dose of midodrine.  Plan for discharge tomorrow to home with home health when blood pressure improves.   Assessment & Plan:   Principal Problem:   CHF exacerbation ([place]) Active Problems:   Diabetes mellitus without complication ([place])   Alcohol abuse   Essential hypertension   CKD (chronic kidney disease), stage IV (HCC)   Pressure injury of skin   Volume overload/severe systolic congestive heart failure: Secondary to acute on chronic systolic  heart failure/congestive cardiomyopathy.  Anasarca also contributed by liver cirrhosis/hypoalbuminemia.  Echocardiogram showed ejection fraction of 10-20% Cardiology following.  Started on IV diuretics,now on torsemide 20 mg daily.  Continue to monitor input/output, daily weight. Stopped  hydralazine, Imdur because of low blood pressure .Contnue compression wraps for bilateral lower extremity  edema  Ascites: History of liver cirrhosis from alcohol.  Might also have been contributed by congestive hepatopathy.  Status post paracentesis with removal of 7 L of fluid.  Has been getting  paracentesis once a month for the last 3 months.His abdomen looked distended again. He will definitely need recurrent paracentesis in the future.  He also needs to follow up with GI as an outpatient.  Underwent another session of paracentesis on 5/31 with removal of 4.3 L of fluid. We have reached to [facility]  gastroenterology to arrange a follow-up appointment as an outpatient so that he can be referred for recurrent  paracentesis in the future.he has an an appointment with Vicie Mutters on 6/16 at 9: 30  Hypotension: Imdur, hydralazine discontinued.  Patient remains hypotensive.  Started on midodrine,dose increased today  Chest pain/Vatch:   He had left-sided chest pain on 01/31/21,now resolved.  EKG did not show any acute ischemic changes.  Has intermittent episodes of nonsustained V. tach.  Continue monitoring.  We will monitor electrolytes  Left ventricular thrombus: Not a candidate for anticoagulation due to noncompliance, GI bleed  CKD stage IV: Currently kidney function at baseline ,kidney function improved  with diuresis  Normocytic anemia: Hemoglobin is in the range of 7. Anemia is most likely associated with CKD.  No evidence of acute blood loss. he has history of GI bleed.  Normal iron as per  iron studies.  We will transfuse if hemoglobin drops less than 7.  Severe BPH/chronic bladder outlet obstruction: On chronic indwelling Foley catheter.  Changed monthly.  Chronic alcoholism: Reports quitting drinking 6 months ago, not sure if this is true.  No signs of withdrawal.  Continue monitoring for withdrawal.  Continue thiamine, folic acid.  Chronic bilateral lower extremity wound: Follows at wound care center,Dr  Dellia Nims.  Wound care consulted here  Debility/deconditioning/goals of care: Palliative  care also following during this hospitalization and recommend palliative care follow-up as an outpatient when he is discharged.  PT/OT recommended home health on discharge.         DVT prophylaxis:Heparin [place] Code Status: DNR Family Communication: Called sister for update on 01/27/21 Status is: Inpatient  Remains inpatient appropriate because:Inpatient level of care appropriate due to severity of illness   Dispo: The patient is from: Home              Anticipated d/c is to: Home tomorrow              Patient currently is not medically stable to d/c.   Difficult to place patient No     Consultants: Cardiology  Procedures:Paracentesis  Antimicrobials:  Anti-infectives (From admission, onward)   None      Subjective:  Patient seen and examined the bedside this morning.  Lying on the bed, weak.  Says he cannot walk but patient is not interested to go to rehab and wants to go home.  Systolic blood pressure was below 100 today, also had an episode of nonsustained V. tach this morning.  Felt not safe to discharge him  Objective: Vitals:   01/31/21 2005 02/01/21 0344 02/01/21 0805 02/01/21 1022  BP: 108/66 (!) 89/56 115/67 98/60  Pulse: 89 91 90   Resp: 18     Temp: 98.6 F (37 C) 97.6 F (36.4 C)    TempSrc:  Oral    SpO2: 100% 100%    Weight:  82.3 kg      Intake/Output Summary (Last 24 hours) at 02/01/2021 1055 Last data filed at 02/01/2021 0351 Gross per 24 hour  Intake 320 ml  Output 600 ml  Net -280 ml   Filed Weights   01/29/21 0527 01/30/21 0500 02/01/21 0344  Weight: 82.1 kg 83.2 kg 82.3 kg    Examination:    General exam: Overall comfortable, not in distress, very deconditioned, debilitated, chronically ill looking, weak Respiratory system: Bilateral diminished air sounds, no wheezes or crackles  Cardiovascular system: S1 & S2 heard, RRR.  Gastrointestinal system: Abdomen is distended, soft and nontender.  Ascites Central nervous system: Alert and  oriented Extremities: Bilateral lower extremity wrapped with dressings Skin: Ulcers on bilateral lower extremities  Data Reviewed: I have personally reviewed following labs and imaging studies  CBC: Recent Labs  Lab 01/27/21 0413 01/28/21 0441 01/30/21 0436  WBC 4.7 5.1 4.9  NEUTROABS 3.6 4.0 3.8  HGB 7.0* 7.8* 7.4*  HCT 23.5* 26.2* 24.8*  MCV 88.7 89.1 89.2  PLT 231 264 202   Basic Metabolic Panel: Recent Labs  Lab 01/27/21 0413 01/27/21 2317 01/28/21 0441 01/29/21 0441 01/30/21 0430 01/30/21 0436  NA 139 142 136 137  --  136  K 4.0 3.8 4.0 3.9  --  4.3  CL 105 104 101 101  --  100  CO2 29 32 27 29  --  30  GLUCOSE 100* 142* 89 97  --  109*  BUN 62* 60* 56* 60*  --  63*  CREATININE 2.42* 2.70* 2.37* 2.46*  --  2.42*  CALCIUM 8.1* 8.4* 8.0* 8.2*  --  8.2*  MG  --  2.1  --   --  1.9  --    GFR: Estimated Creatinine Clearance: 27.2 mL/min (A) (by C-G formula based on SCr of 2.42 mg/dL (H)). Liver Function Tests: No results for input(s): AST, ALT, ALKPHOS,  BILITOT, PROT, ALBUMIN in the last 168 hours. No results for input(s): LIPASE, AMYLASE in the last 168 hours. No results for input(s): AMMONIA in the last 168 hours. Coagulation Profile: Recent Labs  Lab 01/27/21 1627  INR 1.2   Cardiac Enzymes: No results for input(s): CKTOTAL, CKMB, CKMBINDEX, TROPONINI in the last 168 hours. BNP (last 3 results) No results for input(s): PROBNP in the last 8760 hours. HbA1C: No results for input(s): HGBA1C in the last 72 hours. CBG: No results for input(s): GLUCAP in the last 168 hours. Lipid Profile: No results for input(s): CHOL, HDL, LDLCALC, TRIG, CHOLHDL, LDLDIRECT in the last 72 hours. Thyroid Function Tests: No results for input(s): TSH, T4TOTAL, FREET4, T3FREE, THYROIDAB in the last 72 hours. Anemia Panel: No results for input(s): VITAMINB12, FOLATE, FERRITIN, TIBC, IRON, RETICCTPCT in the last 72 hours. Sepsis Labs: No results for input(s): PROCALCITON,  LATICACIDVEN in the last 168 hours.  Recent Results (from the past 240 hour(s))  Resp Panel by RT-PCR (Flu A&B, Covid) Nasopharyngeal Swab     Status: None   Collection Time: 01/23/21 10:20 PM   Specimen: Nasopharyngeal Swab; Nasopharyngeal(NP) swabs in vial transport medium  Result Value Ref Range Status   SARS Coronavirus 2 by RT PCR NEGATIVE NEGATIVE Final    Comment: (NOTE) SARS-CoV-2 target nucleic acids are NOT DETECTED.  The SARS-CoV-2 RNA is generally detectable in upper respiratory specimens during the acute phase of infection. The lowest concentration of SARS-CoV-2 viral copies this assay can detect is 138 copies/mL. A negative result does not preclude SARS-Cov-2 infection and should not be used as the sole basis for treatment or other patient management decisions. A negative result may occur with  improper specimen collection/handling, submission of specimen other than nasopharyngeal swab, presence of viral mutation(s) within the areas targeted by this assay, and inadequate number of viral copies(<138 copies/mL). A negative result must be combined with clinical observations, patient history, and epidemiological information. The expected result is Negative.  Fact Sheet for Patients:  EntrepreneurPulse.com.au  Fact Sheet for Healthcare Providers:  IncredibleEmployment.be  This test is no t yet approved or cleared by the [place] FDA and  has been authorized for detection and/or diagnosis of SARS-CoV-2 by FDA under an Emergency Use Authorization (EUA). This EUA will remain  in effect (meaning this test can be used) for the duration of the COVID-19 declaration under Section 564(b)(1) of the Act, 21 U.S.C.section 360bbb-3(b)(1), unless the authorization is terminated  or revoked sooner.       Influenza A by PCR NEGATIVE NEGATIVE Final   Influenza B by PCR NEGATIVE NEGATIVE Final    Comment: (NOTE) The Xpert Xpress  SARS-CoV-2/FLU/RSV plus assay is intended as an aid in the diagnosis of influenza from Nasopharyngeal swab specimens and should not be used as a sole basis for treatment. Nasal washings and aspirates are unacceptable for Xpert Xpress SARS-CoV-2/FLU/RSV testing.  Fact Sheet for Patients: EntrepreneurPulse.com.au  Fact Sheet for Healthcare Providers: IncredibleEmployment.be  This test is not yet approved or cleared by the [place] FDA and has been authorized for detection and/or diagnosis of SARS-CoV-2 by FDA under an Emergency Use Authorization (EUA). This EUA will remain in effect (meaning this test can be used) for the duration of the COVID-19 declaration under Section 564(b)(1) of the Act, 21 U.S.C. section 360bbb-3(b)(1), unless the authorization is terminated or revoked.  Performed at [facility], [place] [address], [place], [place] [address]          Radiology  Studies: No results found.      Scheduled Meds: . calcium carbonate  1 tablet Oral TID WC  . Chlorhexidine Gluconate Cloth  6 each Topical Daily  . folic acid  1 mg Oral Daily  . heparin  5,000 Units Subcutaneous Q8H  . metoprolol tartrate  12.5 mg Oral BID  . midodrine  10 mg Oral TID WC  . multivitamin with minerals  1 tablet Oral Daily  . sodium chloride flush  3 mL Intravenous Q12H  . thiamine  100 mg Oral Daily   Or  . thiamine  100 mg Intravenous Daily  . [START ON 02/02/2021] torsemide  40 mg Oral Daily   Continuous Infusions: . sodium chloride       LOS: 8 days    Time spent: 25 mins.More than 50% of that time was spent in counseling and/or coordination of care.      Shelly Coss, MD [facility] P6/12/2020, 10:55 AM

## 2021-02-02 DIAGNOSIS — I5023 Acute on chronic systolic (congestive) heart failure: Secondary | ICD-10-CM | POA: Diagnosis not present

## 2021-02-02 MED ORDER — ONDANSETRON HCL 4 MG/2ML IJ SOLN
4.0000 mg | Freq: Four times a day (QID) | INTRAMUSCULAR | Status: DC | PRN
  Administered 2021-02-02 – 2021-02-10 (×5): 4 mg via INTRAVENOUS
  Filled 2021-02-02 (×7): qty 2

## 2021-02-02 MED ORDER — THIAMINE HCL 100 MG PO TABS: 100.0000 mg | ORAL_TABLET | Freq: Every day | ORAL | 0 refills | Status: AC

## 2021-02-02 MED ORDER — TORSEMIDE 20 MG PO TABS: 40.0000 mg | ORAL_TABLET | Freq: Every day | ORAL | 0 refills | Status: AC

## 2021-02-02 MED ORDER — PANTOPRAZOLE SODIUM 40 MG IV SOLR
40.0000 mg | INTRAVENOUS | Status: DC
  Administered 2021-02-02 – 2021-02-04 (×3): 40 mg via INTRAVENOUS
  Filled 2021-02-02 (×3): qty 40

## 2021-02-02 MED ORDER — ACETAMINOPHEN 325 MG PO TABS
650.0000 mg | ORAL_TABLET | Freq: Four times a day (QID) | ORAL | Status: DC | PRN
  Administered 2021-02-02 – 2021-02-04 (×5): 650 mg via ORAL
  Filled 2021-02-02 (×6): qty 2

## 2021-02-02 MED ORDER — FOLIC ACID 1 MG PO TABS: 1.0000 mg | ORAL_TABLET | Freq: Every day | ORAL | 0 refills | Status: AC

## 2021-02-02 MED ORDER — METOPROLOL TARTRATE 25 MG PO TABS: 12.5000 mg | ORAL_TABLET | Freq: Two times a day (BID) | ORAL | 0 refills | Status: AC

## 2021-02-02 MED ORDER — CALCIUM CARBONATE ANTACID 500 MG PO CHEW: 1.0000 | CHEWABLE_TABLET | Freq: Three times a day (TID) | ORAL | 0 refills | Status: AC | PRN

## 2021-02-02 MED ORDER — MIDODRINE HCL 10 MG PO TABS: 10.0000 mg | ORAL_TABLET | Freq: Three times a day (TID) | ORAL | 0 refills | Status: AC

## 2021-02-02 NOTE — Consult Note (Signed)
[place] Nurse wound follow up Patient receiving care in [facility] Wound type: no wounds found on BLE Measurement: Wound bed: Drainage (amount, consistency, odor)  Periwound: Dressing procedure/placement/frequency: 4 layer Profore compression wraps removed from BLE. Legs washed, dried, moisturizing ointment applied. Patient tells me his [facility] nurse had been applying 2 layers and not using layer 3 to apply compression.  And, Profore lite is what was outlined in [place]. [place]'s note on 6/01.  Therefore Profore lite is what was applied today. Val Riles, RN, MSN, CWOCN, CNS-BC, pager [phone]

## 2021-02-02 NOTE — TOC Transition Note (Signed)
Transition of Care [facility]) - CM/SW Discharge Note   Patient Details  Name: [name] MRN: [id number] Date of Birth: 12-Oct-1940  [facility] CM/SW Contact:  Ross Ludwig, LCSW Phone Number: 02/02/2021, 10:53 AM   Clinical Narrative:     Patient will be going home with home health PT and RN through [facility].  CSW signing off please reconsult with any other social work needs, home health agency has been notified of planned discharge.    Final next level of care: [place] Barriers to Discharge: Barriers Resolved   Patient Goals and CMS Choice Patient states their goals for this hospitalization and ongoing recovery are:: To return back home. CMS Medicare.gov Compare Post Acute Care list provided to:: Patient Choice offered to / list presented to : Patient  Discharge Placement                       Discharge Plan and Services   Discharge Planning Services: CM Consult                      HH Arranged: PT,RN [place] Date [place]: 02/02/21 Time [place]: 1053 Representative spoke with at [place]: [place] ([place]) Interventions     Readmission Risk Interventions No flowsheet data found.

## 2021-02-02 NOTE — Plan of Care (Signed)
  Problem: Education: Goal: Knowledge of General Education information will improve Description: Including pain rating scale, medication(s)/side effects and non-pharmacologic comfort measures Outcome: Progressing   Problem: Activity: Goal: Risk for activity intolerance will decrease Outcome: Progressing   Problem: Coping: Goal: Level of anxiety will decrease Outcome: Progressing   Problem: Pain Managment: Goal: General experience of comfort will improve Outcome: Progressing   Problem: Safety: Goal: Ability to remain free from injury will improve Outcome: Progressing   Problem: Skin Integrity: Goal: Risk for impaired skin integrity will decrease Outcome: Progressing   Problem: Education: Goal: Ability to verbalize understanding of medication therapies will improve Outcome: Progressing   Problem: Activity: Goal: Capacity to carry out activities will improve Outcome: Progressing

## 2021-02-02 NOTE — Progress Notes (Signed)
Patient's Foley was changed at 2345. Pt now has a 14 Fr. Foley Catheter. Patient still complains of pain/spasms, but not as bad when the 16 Fr. Foley was in. Notified on call provider about this, as well as patient not having adequate output. Patient ended up having 300 mL of urine output throughout the night shift. Patient was bladder scanned three times with readings of 0 mL, >60 mL, and >70 mL. Patient also stated that he had not taken in much liquids.

## 2021-02-02 NOTE — Progress Notes (Signed)
Physical Therapy Treatment Patient Details Name: [name] MRN: [id number] DOB: 1940/09/30 Today's Date: 02/02/2021    History of Present Illness Pt admitted through ED with c/o LE, abdominal and testicular swelling and dx with CHF exacerbation.  Pt with hx of PVD, demand ischemia, CHF, end stage cardiomegly, etoh abuse, Cirrhosis with recurrent ascites, chronic indwelling catheter, chonice LE wounds (being treated at wound center)    PT Comments    Patient is making good progress with therapy and increased gait distance to ~100' with 2 standing rest breaks. Pt educated on importance of rest breaks and cued to take seated rest break due to SOB. SpO2 dropped to 92% on RA with gait and returned to 100% with seated rest. Patient completed repeated sit<>stands from recliner for practice from lower surface, cues for bil UE use for power up, pt fatigued after 3 reps. Acute PT will continue to progress as able. Recommending HHPT follow up.      Follow Up Recommendations  Home health PT     Equipment Recommendations  None recommended by PT    Recommendations for Other Services       Precautions / Restrictions Precautions Precautions: Fall Precaution Comments: blind R eye Restrictions Weight Bearing Restrictions: No    Mobility  Bed Mobility Overal bed mobility: Needs Assistance Bed Mobility: Supine to Sit     Supine to sit: Supervision;HOB elevated     General bed mobility comments: HOB elevated, increased time    Transfers Overall transfer level: Needs assistance Equipment used: Rolling walker (2 wheeled) Transfers: Sit to/from Stand Sit to Stand: Supervision;From elevated surface         General transfer comment: pt using bil UE's for power up from slightly elevated EOB, supervision/guarding for safety.  Ambulation/Gait Ambulation/Gait assistance: Supervision;Min guard Gait Distance (Feet): 100 Feet Assistive device: Rolling walker (2 wheeled) Gait  Pattern/deviations: Step-through pattern;Decreased stride length;Trunk flexed Gait velocity: decreased   General Gait Details: cues for safe proximity to RW throughout. No overt LOB noted; close guarding/supervision for safety. Cues for standing rest breaks due to SOB and cues to sit and recover as pt fatigued. SpO2 92% on RA with gait.   Stairs             Wheelchair Mobility    Modified Rankin (Stroke Patients Only)       Balance Overall balance assessment: Needs assistance Sitting-balance support: No upper extremity supported;Feet supported Sitting balance-Leahy Scale: Good     Standing balance support: Bilateral upper extremity supported;During functional activity Standing balance-Leahy Scale: Poor Standing balance comment: reliant on UE support                            Cognition Arousal/Alertness: Awake/alert Behavior During Therapy: WFL for tasks assessed/performed Overall Cognitive Status: Within Functional Limits for tasks assessed                                        Exercises      General Comments        Pertinent Vitals/Pain Pain Assessment: Faces Faces Pain Scale: No hurt Pain Intervention(s): Monitored during session    Home Living                      Prior Function            PT Goals (  current goals can now be found in the care plan section) Acute Rehab PT Goals Patient Stated Goal: Reduce swelling, regain mobility and IND, return home PT Goal Formulation: With patient Time For Goal Achievement: 02/09/21 Potential to Achieve Goals: Fair Progress towards PT goals: Progressing toward goals    Frequency    Min 3X/week      PT Plan Current plan remains appropriate    Co-evaluation              AM-PAC PT "6 Clicks" Mobility   Outcome Measure  Help needed turning from your back to your side while in a flat bed without using bedrails?: A Little Help needed moving from lying on your  back to sitting on the side of a flat bed without using bedrails?: A Little Help needed moving to and from a bed to a chair (including a wheelchair)?: A Little Help needed standing up from a chair using your arms (e.g., wheelchair or bedside chair)?: A Little Help needed to walk in hospital room?: A Little Help needed climbing 3-5 steps with a railing? : A Little 6 Click Score: 18    End of Session Equipment Utilized During Treatment: Gait belt Activity Tolerance: Patient tolerated treatment well;Patient limited by fatigue Patient left: in chair;with call bell/phone within reach;with chair alarm set Nurse Communication: Mobility status PT Visit Diagnosis: Muscle weakness (generalized) (M62.81);Difficulty in walking, not elsewhere classified (R26.2)     Time: 8466-5993 PT Time Calculation (min) (ACUTE ONLY): 23 min  Charges:  $Gait Training: 23-37 mins                     Verner Mould, DPT Acute Rehabilitation Services Office [phone] Pager [phone]     [name] 02/02/2021, 4:03 PM

## 2021-02-02 NOTE — Progress Notes (Addendum)
Patient had an episode of emesis 400 cc. Pt complaining of nausea and pain/burning in Bilateral feet. Pt is unable to take PO medications at this time. Provider updated.  Will continue to monitor.

## 2021-02-02 NOTE — Progress Notes (Signed)
Patient continues to experience nausea despite administration of antiemetic. Pt had a 2nd episode of emesis, unable to capture amount.

## 2021-02-02 NOTE — Progress Notes (Addendum)
Progress Note  Patient Name: [name] Date of Encounter: 02/02/2021  [facility] Cardiologist: Candee Furbish, MD   Subjective   No complaints Hard to understand with speech impediment ? Previous stroke   Inpatient Medications    Scheduled Meds: . calcium carbonate  1 tablet Oral TID WC  . Chlorhexidine Gluconate Cloth  6 each Topical Daily  . folic acid  1 mg Oral Daily  . heparin  5,000 Units Subcutaneous Q8H  . metoprolol tartrate  12.5 mg Oral BID  . midodrine  10 mg Oral TID WC  . multivitamin with minerals  1 tablet Oral Daily  . phenazopyridine  100 mg Oral TID WC  . sodium chloride flush  3 mL Intravenous Q12H  . thiamine  100 mg Oral Daily   Or  . thiamine  100 mg Intravenous Daily  . torsemide  40 mg Oral Daily   Continuous Infusions: . sodium chloride     PRN Meds: sodium chloride, acetaminophen, sodium chloride flush   Vital Signs    Vitals:   02/01/21 2123 02/01/21 2235 02/02/21 0544 02/02/21 0600  BP: 104/69 107/66 (!) 140/111 110/62  Pulse: 78  84   Resp: 18  18   Temp: (!) 97.4 F (36.3 C)  (!) 97.4 F (36.3 C)   TempSrc:   Oral   SpO2: 100%  100%   Weight:   85.3 kg     Intake/Output Summary (Last 24 hours) at 02/02/2021 0845 Last data filed at 02/02/2021 0546 Gross per 24 hour  Intake 240 ml  Output 300 ml  Net -60 ml   Last 3 Weights 02/02/2021 02/01/2021 01/30/2021  Weight (lbs) 188 lb 0.8 oz 181 lb 8 oz 183 lb 6.8 oz  Weight (kg) 85.3 kg 82.328 kg 83.2 kg  Some encounter information is confidential and restricted. Go to Review Flowsheets activity to see all data.      Telemetry    Sinus rhythm with PVCs.  NSVT up to 9 beats.- Personally Reviewed  ECG    No new tracing today - Personally Reviewed  Physical Exam   Edentulous Chronically ill black male Lungs clear SEM PMI enlarged  No edema  Labs    High Sensitivity Troponin:   Recent Labs  Lab 01/23/21 2146 01/24/21 0047  TROPONINIHS 41* 42*       Chemistry Recent Labs  Lab 01/29/21 0441 01/30/21 0436 02/01/21 1113  NA 137 136 136  K 3.9 4.3 4.8  CL 101 100 98  CO2 29 30 30   GLUCOSE 97 109* 115*  BUN 60* 63* 75*  CREATININE 2.46* 2.42* 2.66*  CALCIUM 8.2* 8.2* 8.6*  GFRNONAA 26* 27* 24*  ANIONGAP 7 6 8      Hematology Recent Labs  Lab 01/28/21 0441 01/30/21 0436 02/01/21 1113  WBC 5.1 4.9 5.5  RBC 2.94* 2.78* 2.90*  HGB 7.8* 7.4* 7.7*  HCT 26.2* 24.8* 26.2*  MCV 89.1 89.2 90.3  MCH 26.5 26.6 26.6  MCHC 29.8* 29.8* 29.4*  RDW 19.2* 20.0* 21.1*  PLT 264 288 291    BNP No results for input(s): BNP, PROBNP in the last 168 hours.   DDimer No results for input(s): DDIMER in the last 168 hours.   Radiology    No results found.  Cardiac Studies   Echo from 10/14/20:  1. No left ventricular thrombus with Definity contrast. Left ventricular  ejection fraction, by estimation, is <20%. The left ventricle has severely  decreased function. The left ventricle demonstrates  global hypokinesis.  The left ventricular internal cavity size was severely dilated.  2. Right ventricular systolic function is moderately reduced. The right  ventricular size is severely enlarged. There is moderately elevated  pulmonary artery systolic pressure. The estimated right ventricular  systolic pressure is 86.5 mmHg.  3. Left atrial size was severely dilated.  4. Right atrial size was severely dilated.  5. The mitral valve is normal in structure. Mild mitral valve  regurgitation.  6. The aortic valve is tricuspid. Aortic valve regurgitation is not  visualized. Mild aortic valve sclerosis is present, with no evidence of  aortic valve stenosis.   Comparison(s): Prior images reviewed side by side. No thrombus is seen in  the left ventricle with Definity contrast, on the current images.   Patient Profile     80 y.o. 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  Patient presented with dyspnea, anasarca in the setting of running out of torsemide.  Cardiology was consulted and following for decompensated heart failure.   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 Abstain last year synthetic function INR normal confirmed on [place] had paracentesis by IR 01/27/21 Needs F/u with GI for chronic management    AKI on CKD stage IV Cardiorenal syndrome - Cr 2.6 yesterday at baseline ?  Not worsening with diuresis   History of LV thrombus on Echo 2020 -Historically took Coumadin, this was stopped due to recurrent GI bleed, given high risk of bleeding and resolved thrombus on Echo 09/2020, no recommendation for therapeutic anticoagulation at this time   Hypertension - BP low, stopped hydralazine and isordil,  transitioned to PO torsemide and continue low dose metoprolol 12.5mg  BID   Type 2 diabetes BPH Anemia of chronic disease  - management per primary team   Disposition: Will arrange appointment with advanced heart failure clinic in 2-3 weeks and he is scheduled with CHF heart impact clinic on 02/06/21 at 8 AM.    For questions or updates, please contact [facility] Please consult [email or web address] for  contact info under    Signed, Jenkins Rouge, MD  02/02/2021, 8:45 AM

## 2021-02-02 NOTE — Progress Notes (Signed)
Patient complained of persistent nausea and vomited more than once. We will hold dc,plan for tomorrow

## 2021-02-02 NOTE — Care Management Important Message (Signed)
Important Message  Patient Details IM Letter given to the Patient. Name: [name] MRN: [id number] Date of Birth: 1940/12/28   Medicare Important Message Given:  Yes     [name] 02/02/2021, 12:54 PM

## 2021-02-02 NOTE — Discharge Summary (Addendum)
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  He was found to be severely volume overloaded on presentation and was started on IV Lasix.  Cardiology also consulted and following.    Volume overload has significantly improved during this hospitalization, he also underwent paracentesis twice.  Blood pressure has been better with midodrine.  He needs to follow-up with cardiology and GI as an outpatient.  He is medically stable for discharge home today with home health.  Following problems were addressed during his hospitalization:   Volume overload/severe systolic congestive heart failure: Secondary to acute on chronic systolic  heart failure/congestive cardiomyopathy.  Anasarca also contributed by liver cirrhosis/hypoalbuminemia.  Echocardiogram showed ejection fraction of 10-20% Cardiology were following.  Started on IV diuretics,now on torsemide 40 mg daily.   Stopped  hydralazine, Imdur because of low blood pressure .Contnue compression wraps for bilateral lower extremity  edema.  He will follow-up with heart failure clinic.  Ascites: History of liver cirrhosis from alcohol.  Might also have been contributed by congestive hepatopathy.  Status post paracentesis with removal of 7 L of fluid on admission.  Has been getting  paracentesis once a month for the last 3 months. He will definitely need recurrent paracentesis in the future.  He also needs to follow up with GI as an outpatient.  Underwent another session of paracentesis on 5/31 with removal of 4.3 L of fluid. [facility] arranged  a follow-up appointment as an outpatient so that he can be referred for recurrent  paracentesis in the future.he has an an appointment with Vicie Mutters on 6/16 at 9: 30 am  Hypotension: Imdur, hydralazine discontinued.    Started on midodrine,with bp improvement  Chest pain/Vatch:   He had left-sided chest pain on 01/31/21,now resolved.  EKG did not show any acute ischemic changes.  Has intermittent episodes of nonsustained V. tach.   Left ventricular thrombus: Not a candidate for anticoagulation due to noncompliance, GI bleed  CKD stage IV: Currently kidney function at baseline ,kidney function improved  with diuresis.  He needs to follow-up with nephrology as an outpatient.  Referral sent to [facility].  Normocytic anemia: Hemoglobin is in the range of 7. Anemia is most likely associated with CKD.  No evidence of acute blood loss. he has history of GI bleed.  Normal iron as per  iron studies.    Needs to monitor as an outpatient  Severe BPH/chronic bladder outlet obstruction: On chronic indwelling Foley catheter.  Changed monthly.  Chronic alcoholism: Reports quitting drinking 6 months ago, not sure if this is true.  No signs of withdrawal. .  Continue thiamine, folic acid.  Chronic bilateral lower extremity wound: Follows at wound care center,Dr Dellia Nims.  Wound care consulted here  Debility/deconditioning/goals of  care: Palliative care also following  during this hospitalization and recommend palliative care follow-up as an outpatient when he is discharged.  PT/OT recommended home health on discharge.     Discharge Diagnoses:  Principal Problem:   CHF exacerbation ([place]) Active Problems:   Diabetes mellitus without complication ([name])   Alcohol abuse   Essential hypertension   CKD (chronic kidney disease), stage IV ([facility])   Pressure injury of skin    Discharge Instructions  Discharge Instructions    Ambulatory referral to Nephrology   Complete by: As directed    In 2 weeks   Diet - low sodium heart healthy   Complete by: As directed    Discharge instructions   Complete by: As directed    1)Please take prescribed medications as instructed. 2)Follow up with your PCP in a week.  Do a CBC, BMP test during the follow-up 3)You have an appointment with heart failure clinic on 02/06/2021 at 8 AM 4)You have an appointment with [facility] gastroenter[facility], Vicie Mutters on 02/12/2021 at 9:30 AM.  Please make that appointment 5)Follow up with wound care clinic.  Follow-up with home health.   Discharge wound care:   Complete by: As directed    As per wound nurse,wound clinic   Increase activity slowly   Complete by: As directed      Allergies as of 02/02/2021   No Known Allergies     Medication List    STOP taking these medications   aspirin EC 81 MG tablet   carvedilol 3.125 MG tablet Commonly known as: COREG     TAKE these medications   calcium carbonate 500 MG chewable tablet Commonly known as: TUMS - dosed in mg elemental calcium Chew 1 tablet (200 mg of elemental calcium total) by mouth 3 (three) times daily as needed for indigestion or heartburn.   folic acid 1 MG tablet Commonly known as: FOLVITE Take 1 tablet (1 mg total) by mouth daily. Start taking on: February 03, 2021   ibuprofen 200 MG tablet Commonly known as: ADVIL Take 200 mg by mouth every 6 (six) hours as needed.   metoprolol tartrate 25 MG  tablet Commonly known as: LOPRESSOR Take 0.5 tablets (12.5 mg total) by mouth 2 (two) times daily.   midodrine 10 MG tablet Commonly known as: PROAMATINE Take 1 tablet (10 mg total) by mouth 3 (three) times daily with meals.   pantoprazole 40 MG tablet Commonly known as: Protonix Take 1 tablet (40 mg total) by mouth daily. What changed: when to take this   thiamine 100 MG tablet Take 1 tablet (100 mg total) by mouth daily. Start taking on: February 03, 2021   torsemide 20 MG tablet Commonly known as: DEMADEX Take 2 tablets (40 mg total) by mouth daily.            Discharge Care Instructions  (From admission, onward)         Start     Ordered   02/02/21 0000  Discharge wound care:       Comments: As per wound nurse,wound clinic   02/02/21 1035          Follow-up Information    [facility] Follow up.   Why: [facility] Contact information: [place] [address] [facility] Follow up on 02/06/2021.   Specialty: Cardiology Why: At 8AM for your heart failure follow up  appointment  Address is [address].  Free valet parking is available at [employer] [address].  Please bring all medications with you to this visit.  Clinic number for questions is [phone]. Contact information: [address] [id number] [place]       [name], MD. Schedule an appointment as soon as possible for a visit in 1 week(s).   Specialty: Family Medicine Contact information: [place] [address] [phone]        Vladimir Crofts, PA-C. Go in 10 day(s).   Specialty: Gastroenterology Why: You have an appointment on 02/12/2021 at 9:30 AM. Contact information: [place] [address] [phone]              No Known  Allergies  Consultations:  Cardiology   Procedures/Studies: DG Chest 2 View  Result Date: 01/23/2021 CLINICAL DATA:  Shortness of breath.  Exertional dyspnea. EXAM: CHEST - 2 VIEW COMPARISON:  Most recent radiograph 12/04/2020 FINDINGS: Stable cardiomegaly, least moderate in degree. Stable mediastinal contours. Bilateral pleural effusions are new from prior. Vascular congestion. Fluid in the fissures. No pneumothorax or confluent consolidation. No acute osseous abnormalities are seen. IMPRESSION: CHF with cardiomegaly, vascular congestion, fluid in the fissures, and small bilateral pleural effusions. Electronically Signed   By: Keith Rake M.D.   On: 01/23/2021 22:14   US Paracentesis  Result Date: 01/27/2021 INDICATION: Patient with history of alcoholic cirrhosis, CHF, end-stage cardiomyopathy, chronic kidney disease, recurrent ascites. Request received for therapeutic paracentesis. EXAM: ULTRASOUND GUIDED THERAPEUTIC PARACENTESIS MEDICATIONS: 1% lidocaine to skin and subcutaneous tissue COMPLICATIONS: None immediate. PROCEDURE: Informed written consent was obtained from the patient after a discussion of the risks, benefits and alternatives to treatment. A timeout was performed prior to the initiation of the procedure. Initial ultrasound scanning demonstrates a large amount of ascites within the right lower abdominal quadrant. The right lower abdomen was prepped and draped in the usual sterile fashion. 1% lidocaine was used for local anesthesia. Following this, a 19 gauge, 10-cm, Yueh catheter was introduced. An ultrasound image was saved for documentation purposes. The paracentesis was performed. The catheter was removed and a dressing was applied. The patient tolerated the procedure well without immediate post procedural complication. FINDINGS: A total of approximately 4.3 liters of milky/cream colored fluid was removed. IMPRESSION: Successful ultrasound-guided therapeutic paracentesis yielding  4.3 liters of peritoneal fluid. Read by: Rowe Robert, PA-C Electronically Signed   By: [name].  Shick M.D.   On: 01/27/2021 12:11   US Paracentesis  Result Date: 01/24/2021 INDICATION: Patient with history of heart failure, unspecified liver cirrhosis, chronic alcoholism, abdominal distension and recurrent ascites. Request is for therapeutic paracentesis. EXAM: ULTRASOUND GUIDED THERAPEUTIC PARACENTESIS MEDICATIONS: Lidocaine 1% 10 mL COMPLICATIONS: None immediate. PROCEDURE: Informed written consent was obtained from the patient after a discussion of the risks, benefits and alternatives to treatment. A timeout was performed prior to the initiation of the procedure. Initial ultrasound scanning demonstrates a large amount of ascites within the right lower abdominal quadrant. The right lower abdomen was prepped and draped in the usual sterile fashion. 1% lidocaine was used for local anesthesia. Following this, a 19 gauge, 7-cm, Yueh catheter was introduced. An ultrasound image was saved for documentation purposes. The paracentesis was performed. The catheter was removed and a dressing was applied. The patient tolerated the procedure well without immediate post procedural complication. Patient received post-procedure intravenous albumin; see nursing notes for details. FINDINGS: A  total of approximately 7.4 L of milky yellow fluid was removed. IMPRESSION: Successful ultrasound-guided paracentesis yielding 7.4 liters of peritoneal fluid. Read by: Rushie Nyhan, NP Electronically Signed   By: Sandi Mariscal M.D.   On: 01/24/2021 13:19       Subjective: Patient seen and examined at bedside this morning.  Medically stable for discharge today.  I called the sister and discussed the discharge planning.  Discharge Exam: Vitals:   02/02/21 0600 02/02/21 0922  BP: 110/62 105/71  Pulse:  78  Resp:  18  Temp:    SpO2:  100%   Vitals:   02/01/21 2235 02/02/21 0544 02/02/21 0600 02/02/21 0922  BP: 107/66 (!)  140/111 110/62 105/71  Pulse:  84  78  Resp:  18  18  Temp:  (!) 97.4 F (36.3 C)    TempSrc:  Oral    SpO2:  100%  100%  Weight:  85.3 kg      General: Pt is alert, awake, not in acute distress, very deconditioned, debilitated Cardiovascular: RRR, S1/S2 +, no rubs, no gallops Respiratory: CTA bilaterally, no wheezing, no rhonchi Abdominal: Soft, NT, ascites, bowel sounds + Extremities: Trace edema on bilateral lower extremities, bilateral lower extremity ulcers    The results of significant diagnostics from this hospitalization (including imaging, microbiology, ancillary and laboratory) are listed below for reference.     Microbiology: Recent Results (from the past 240 hour(s))  Resp Panel by RT-PCR (Flu A&B, Covid) Nasopharyngeal Swab     Status: None   Collection Time: 01/23/21 10:20 PM   Specimen: Nasopharyngeal Swab; Nasopharyngeal(NP) swabs in vial transport medium  Result Value Ref Range Status   SARS Coronavirus 2 by RT PCR NEGATIVE NEGATIVE Final    Comment: (NOTE) SARS-CoV-2 target nucleic acids are NOT DETECTED.  The SARS-CoV-2 RNA is generally detectable in upper respiratory specimens during the acute phase of infection. The lowest concentration of SARS-CoV-2 viral copies this assay can detect is 138 copies/mL. A negative result does not preclude SARS-Cov-2 infection and should not be used as the sole basis for treatment or other patient management decisions. A negative result may occur with  improper specimen collection/handling, submission of specimen other than nasopharyngeal swab, presence of viral mutation(s) within the areas targeted by this assay, and inadequate number of viral copies(<138 copies/mL). A negative result must be combined with clinical observations, patient history, and epidemiological information. The expected result is Negative.  Fact Sheet for Patients:  EntrepreneurPulse.com.au  Fact Sheet for Healthcare Providers:   IncredibleEmployment.be  This test is no t yet approved or cleared by the [place] FDA and  has been authorized for detection and/or diagnosis of SARS-CoV-2 by FDA under an Emergency Use Authorization (EUA). This EUA will remain  in effect (meaning this test can be used) for the duration of the COVID-19 declaration under Section 564(b)(1) of the Act, 21 U.S.C.section 360bbb-3(b)(1), unless the authorization is terminated  or revoked sooner.       Influenza A by PCR NEGATIVE NEGATIVE Final   Influenza B by PCR NEGATIVE NEGATIVE Final    Comment: (NOTE) The Xpert Xpress SARS-CoV-2/FLU/RSV plus assay is intended as an aid in the diagnosis of influenza from Nasopharyngeal swab specimens and should not be used as a sole basis for treatment. Nasal washings and aspirates are unacceptable for Xpert Xpress SARS-CoV-2/FLU/RSV testing.  Fact Sheet for Patients: EntrepreneurPulse.com.au  Fact Sheet for Healthcare Providers: IncredibleEmployment.be  This test is not yet approved or cleared by the [place]  FDA and has been authorized for detection and/or diagnosis of SARS-CoV-2 by FDA under an Emergency Use Authorization (EUA). This EUA will remain in effect (meaning this test can be used) for the duration of the COVID-19 declaration under Section 564(b)(1) of the Act, 21 U.S.C. section 360bbb-3(b)(1), unless the authorization is terminated or revoked.  Performed at [facility], [place] [address] [place], [place] [address]      Labs: BNP (last 3 results) Recent Labs    10/11/20 1606 12/04/20 0701 01/23/21 2146  BNP 4,154.2* 2,567.2* 0,102.7*   Basic Metabolic Panel: Recent Labs  Lab 01/27/21 2317 01/28/21 0441 01/29/21 0441 01/30/21 0430 01/30/21 0436 02/01/21 1113  NA 142 136 137  --  136 136  K 3.8 4.0 3.9  --  4.3 4.8  CL 104 101 101  --  100 98  CO2 32 27 29  --  30 30  GLUCOSE 142*  89 97  --  109* 115*  BUN 60* 56* 60*  --  63* 75*  CREATININE 2.70* 2.37* 2.46*  --  2.42* 2.66*  CALCIUM 8.4* 8.0* 8.2*  --  8.2* 8.6*  MG 2.1  --   --  1.9  --  2.4   Liver Function Tests: No results for input(s): AST, ALT, ALKPHOS, BILITOT, PROT, ALBUMIN in the last 168 hours. No results for input(s): LIPASE, AMYLASE in the last 168 hours. No results for input(s): AMMONIA in the last 168 hours. CBC: Recent Labs  Lab 01/27/21 0413 01/28/21 0441 01/30/21 0436 02/01/21 1113  WBC 4.7 5.1 4.9 5.5  NEUTROABS 3.6 4.0 3.8 4.5  HGB 7.0* 7.8* 7.4* 7.7*  HCT 23.5* 26.2* 24.8* 26.2*  MCV 88.7 89.1 89.2 90.3  PLT 231 264 288 291   Cardiac Enzymes: No results for input(s): CKTOTAL, CKMB, CKMBINDEX, TROPONINI in the last 168 hours. BNP: Invalid input(s): POCBNP CBG: No results for input(s): GLUCAP in the last 168 hours. D-Dimer No results for input(s): DDIMER in the last 72 hours. Hgb A1c No results for input(s): HGBA1C in the last 72 hours. Lipid Profile No results for input(s): CHOL, HDL, LDLCALC, TRIG, CHOLHDL, LDLDIRECT in the last 72 hours. Thyroid function studies No results for input(s): TSH, T4TOTAL, T3FREE, THYROIDAB in the last 72 hours.  Invalid input(s): FREET3 Anemia work up No results for input(s): VITAMINB12, FOLATE, FERRITIN, TIBC, IRON, RETICCTPCT in the last 72 hours. Urinalysis    Component Value Date/Time   COLORURINE AMBER (A) 06/21/2019 0916   APPEARANCEUR CLOUDY (A) 06/21/2019 0916   LABSPEC 1.020 06/21/2019 0916   PHURINE 5.0 06/21/2019 0916   GLUCOSEU NEGATIVE 06/21/2019 0916   HGBUR MODERATE (A) 06/21/2019 0916   BILIRUBINUR SMALL (A) 06/21/2019 0916   KETONESUR NEGATIVE 06/21/2019 0916   PROTEINUR 30 (A) 06/21/2019 0916   NITRITE NEGATIVE 06/21/2019 0916   LEUKOCYTESUR MODERATE (A) 06/21/2019 0916   Sepsis Labs Invalid input(s): PROCALCITONIN,  WBC,  LACTICIDVEN Microbiology Recent Results (from the past 240 hour(s))  Resp Panel by RT-PCR  (Flu A&B, Covid) Nasopharyngeal Swab     Status: None   Collection Time: 01/23/21 10:20 PM   Specimen: Nasopharyngeal Swab; Nasopharyngeal(NP) swabs in vial transport medium  Result Value Ref Range Status   SARS Coronavirus 2 by RT PCR NEGATIVE NEGATIVE Final    Comment: (NOTE) SARS-CoV-2 target nucleic acids are NOT DETECTED.  The SARS-CoV-2 RNA is generally detectable in upper respiratory specimens during the acute phase of infection. The lowest concentration of SARS-CoV-2 viral copies this assay can  detect is 138 copies/mL. A negative result does not preclude SARS-Cov-2 infection and should not be used as the sole basis for treatment or other patient management decisions. A negative result may occur with  improper specimen collection/handling, submission of specimen other than nasopharyngeal swab, presence of viral mutation(s) within the areas targeted by this assay, and inadequate number of viral copies(<138 copies/mL). A negative result must be combined with clinical observations, patient history, and epidemiological information. The expected result is Negative.  Fact Sheet for Patients:  EntrepreneurPulse.com.au  Fact Sheet for Healthcare Providers:  IncredibleEmployment.be  This test is no t yet approved or cleared by the [place] FDA and  has been authorized for detection and/or diagnosis of SARS-CoV-2 by FDA under an Emergency Use Authorization (EUA). This EUA will remain  in effect (meaning this test can be used) for the duration of the COVID-19 declaration under Section 564(b)(1) of the Act, 21 U.S.C.section 360bbb-3(b)(1), unless the authorization is terminated  or revoked sooner.       Influenza A by PCR NEGATIVE NEGATIVE Final   Influenza B by PCR NEGATIVE NEGATIVE Final    Comment: (NOTE) The Xpert Xpress SARS-CoV-2/FLU/RSV plus assay is intended as an aid in the diagnosis of influenza from Nasopharyngeal swab specimens  and should not be used as a sole basis for treatment. Nasal washings and aspirates are unacceptable for Xpert Xpress SARS-CoV-2/FLU/RSV testing.  Fact Sheet for Patients: EntrepreneurPulse.com.au  Fact Sheet for Healthcare Providers: IncredibleEmployment.be  This test is not yet approved or cleared by the [place] FDA and has been authorized for detection and/or diagnosis of SARS-CoV-2 by FDA under an Emergency Use Authorization (EUA). This EUA will remain in effect (meaning this test can be used) for the duration of the COVID-19 declaration under Section 564(b)(1) of the Act, 21 U.S.C. section 360bbb-3(b)(1), unless the authorization is terminated or revoked.  Performed at [facility], [place] [address], [place], [place] [address]     Please note: You were cared for by a hospitalist during your hospital stay. Once you are discharged, your primary care physician will handle any further medical issues. Please note that NO REFILLS for any discharge medications will be authorized once you are discharged, as it is imperative that you return to your primary care physician (or establish a relationship with a primary care physician if you do not have one) for your post hospital discharge needs so that they can reassess your need for medications and monitor your lab values.    Time coordinating discharge: 40 minutes  SIGNED:   Shelly Coss, MD  [facility] 02/02/2021, 10:37 AM Pager [phone]  If 7PM-7AM, please contact night-coverage [email or web address] Password [email or web address]

## 2021-02-02 NOTE — Progress Notes (Signed)
Patient was not able to take in PO medications/evening meal. Pt complains of nausea. Will continue to monitor.

## 2021-02-03 LAB — URINALYSIS, ROUTINE W REFLEX MICROSCOPIC
Bilirubin Urine: NEGATIVE
Glucose, UA: NEGATIVE mg/dL
Ketones, ur: NEGATIVE mg/dL
Nitrite: POSITIVE — AB
Protein, ur: 100 mg/dL — AB
Specific Gravity, Urine: 1.014 (ref 1.005–1.030)
WBC, UA: >50 WBC/hpf — ABNORMAL HIGH (ref 0–5)
pH: 5 (ref 5.0–8.0)

## 2021-02-03 LAB — CBC WITH DIFFERENTIAL/PLATELET
Abs Immature Granulocytes: 0.04 10*3/uL (ref 0.00–0.07)
Basophils Absolute: 0 10*3/uL (ref 0.0–0.1)
Basophils Relative: 1 %
Eosinophils Absolute: 0.1 10*3/uL (ref 0.0–0.5)
Eosinophils Relative: 3 %
HCT: 26 % — ABNORMAL LOW (ref 39.0–52.0)
Hemoglobin: 7.7 g/dL — ABNORMAL LOW (ref 13.0–17.0)
Immature Granulocytes: 1 %
Lymphocytes Relative: 7 %
Lymphs Abs: 0.4 10*3/uL — ABNORMAL LOW (ref 0.7–4.0)
MCH: 27 pg (ref 26.0–34.0)
MCHC: 29.6 g/dL — ABNORMAL LOW (ref 30.0–36.0)
MCV: 91.2 fL (ref 80.0–100.0)
Monocytes Absolute: 0.7 10*3/uL (ref 0.1–1.0)
Monocytes Relative: 13 %
Neutro Abs: 3.7 10*3/uL (ref 1.7–7.7)
Neutrophils Relative %: 75 %
Platelets: 329 10*3/uL (ref 150–400)
RBC: 2.85 MIL/uL — ABNORMAL LOW (ref 4.22–5.81)
RDW: 21.3 % — ABNORMAL HIGH (ref 11.5–15.5)
WBC: 4.9 10*3/uL (ref 4.0–10.5)
nRBC: 0 % (ref 0.0–0.2)

## 2021-02-03 LAB — ALBUMIN: Albumin: 2.5 g/dL — ABNORMAL LOW (ref 3.5–5.0)

## 2021-02-03 LAB — BASIC METABOLIC PANEL
Anion gap: 12 (ref 5–15)
BUN: 89 mg/dL — ABNORMAL HIGH (ref 8–23)
CO2: 26 mmol/L (ref 22–32)
Calcium: 8.4 mg/dL — ABNORMAL LOW (ref 8.9–10.3)
Chloride: 95 mmol/L — ABNORMAL LOW (ref 98–111)
Creatinine, Ser: 3.43 mg/dL — ABNORMAL HIGH (ref 0.61–1.24)
GFR, Estimated: 18 mL/min — ABNORMAL LOW (ref >60)
Glucose, Bld: 109 mg/dL — ABNORMAL HIGH (ref 70–99)
Potassium: 5 mmol/L (ref 3.5–5.1)
Sodium: 133 mmol/L — ABNORMAL LOW (ref 135–145)

## 2021-02-03 LAB — SODIUM, URINE, RANDOM: Sodium, Ur: 40 mmol/L

## 2021-02-03 LAB — CREATININE, URINE, RANDOM: Creatinine, Urine: 126.28 mg/dL

## 2021-02-03 MED ORDER — SODIUM CHLORIDE 0.9 % IV SOLN: INTRAVENOUS | Status: DC

## 2021-02-03 MED ORDER — ALBUMIN HUMAN 25 % IV SOLN
12.5000 g | Freq: Four times a day (QID) | INTRAVENOUS | Status: AC
  Administered 2021-02-03 – 2021-02-04 (×3): 12.5 g via INTRAVENOUS
  Filled 2021-02-03 (×3): qty 50

## 2021-02-03 MED ORDER — OXYCODONE HCL 5 MG PO TABS
5.0000 mg | ORAL_TABLET | Freq: Four times a day (QID) | ORAL | Status: DC | PRN
  Administered 2021-02-03 – 2021-02-10 (×11): 5 mg via ORAL
  Filled 2021-02-03 (×11): qty 1

## 2021-02-03 NOTE — Consult Note (Signed)
[facility]rology Consultation Note  Requesting MD: Dr Tawanna Solo, A Reason for consult: AKI on CKD4  HPI: [name] is a 80 y.o. 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 On admission he was found to be severely volume overload which required IV diuretics.  Evaluated by cardiologist who recommended not a candidate for inotropic treatment because of multiple comorbidities.  He underwent 2 paracentesis since admission with removal of around 11.5 L fluid total.  At baseline the creatinine level is around 2.3-2.7 however today the creatinine level elevated to 3.43 therefore we are consulted to evaluate the patient.  The diuretics was held because of AKI.  He was started on midodrine for hypotension and discontinued hydralazine, Imdur. Since admission he is negative by 11.7 L and urine output is only recorded 450 cc. Today, he is sitting on chair with significant improvement of breathing.  He feels fatigue and some complaints of lower abdominal pain.  He has indwelling Foley catheter.  Urine output is recorded as only 450 cc. He is currently on metoprolol, midodrine, multivitamins, PPI and antiemetic. He is on torsemide 40 mg daily at home which he was running out before the admission. He had kidney ultrasound done in 05/2019 with increased echogenicity consistent with CKD without hydronephrosis. He denies nausea, vomiting, dysgeusia, chest pain or shortness of breath.  Creatinine, Ser  Date/Time Value Ref Range Status  02/03/2021 04:42 AM 3.43 (H) 0.61 - 1.24 mg/dL Final  02/01/2021 11:13 AM 2.66 (H) 0.61 - 1.24 mg/dL Final   01/30/2021 04:36 AM 2.42 (H) 0.61 - 1.24 mg/dL Final  01/29/2021 04:41 AM 2.46 (H) 0.61 - 1.24 mg/dL Final  01/28/2021 04:41 AM 2.37 (H) 0.61 - 1.24 mg/dL Final  01/27/2021 11:17 PM 2.70 (H) 0.61 - 1.24 mg/dL Final  01/27/2021 04:13 AM 2.42 (H) 0.61 - 1.24 mg/dL Final  01/26/2021 05:23 AM 2.68 (H) 0.61 - 1.24 mg/dL Final  01/25/2021 04:48 AM 2.70 (H) 0.61 - 1.24 mg/dL Final   PMHx:   Past Medical History:  Diagnosis Date  . Acute cholecystitis 11/23/2017  . Acute GI bleeding 06/30/2019  . Acute respiratory failure with hypoxia ([place])   . Acute upper back pain   . AKI (acute kidney injury) ([place])   . Alcohol abuse 02/26/2015  . Anemia, deficiency   . Back pain    LUMBOSACRAL  . Bronchitis, complicated   . Cardiogenic shock ([place])   . Cardiomegaly   . Cholangitis due to bile duct calculus with obstruction   . Chronic combined systolic and diastolic CHF (congestive heart failure) ([place])   . Chronic pain    HIGH RISK MED USE COMPREHENSIVE HIGH RISK  . CKD (chronic kidney disease), stage III ([place])   . Demand ischemia ([place])    a. minimally elevated troponin peak 0.11 in 2016, felt demand ischemia. Cath offered but pt wished to have done back in [place].  . Diabetes mellitus without complication ([place])   . Edema, leg   . Elevated troponin 02/26/2015  . Essential hypertension   . Fatigue   . Heart abnormality   . Hyperlipidemia   . Hypertension   . Neck pain, acute   . PVD (peripheral  vascular disease) ([place])   . Renal lesion 11/23/2017  . SOB (shortness of breath) on exertion   . Solitary kidney   . Vitamin D deficiency     Past Surgical History:  Procedure Laterality Date  . BIOPSY  05/16/2020   Procedure: BIOPSY;  Surgeon: Carol Ada, MD;  Location: [facility] ENDOSCOPY;  Service: Endoscopy;;  . ENDOSCOPIC RETROGRADE CHOLANGIOPANCREATOGRAPHY (ERCP) WITH PROPOFOL N/A 06/19/2019   Procedure: ENDOSCOPIC RETROGRADE CHOLANGIOPANCREATOGRAPHY (ERCP) WITH PROPOFOL;  Surgeon: Milus Banister, MD;   Location: [facility] ENDOSCOPY;  Service: Endoscopy;  Laterality: N/A;  . ESOPHAGOGASTRODUODENOSCOPY (EGD) WITH PROPOFOL N/A 07/01/2019   Procedure: ESOPHAGOGASTRODUODENOSCOPY (EGD) WITH PROPOFOL;  Surgeon: Rush Landmark Telford Nab., MD;  Location: [facility];  Service: Gastroenterology;  Laterality: N/A;  . ESOPHAGOGASTRODUODENOSCOPY (EGD) WITH PROPOFOL N/A 05/16/2020   Procedure: ESOPHAGOGASTRODUODENOSCOPY (EGD) WITH PROPOFOL;  Surgeon: Carol Ada, MD;  Location: [facility];  Service: Endoscopy;  Laterality: N/A;  . HOT HEMOSTASIS N/A 05/16/2020   Procedure: HOT HEMOSTASIS (ARGON PLASMA COAGULATION/BICAP);  Surgeon: Carol Ada, MD;  Location: [facility];  Service: Endoscopy;  Laterality: N/A;  . IR CHOLANGIOGRAM EXISTING TUBE  12/27/2017  . IR PERC CHOLECYSTOSTOMY  11/25/2017  . IR RADIOLOGIST EVAL & MGMT  02/02/2018  . REMOVAL OF STONES  06/19/2019   Procedure: REMOVAL OF STONES;  Surgeon: Milus Banister, MD;  Location: [facility] ENDOSCOPY;  Service: Endoscopy;;  . Joan Mayans  06/19/2019   Procedure: Joan Mayans;  Surgeon: Milus Banister, MD;  Location: [facility] ENDOSCOPY;  Service: Endoscopy;;    Family Hx:  Family History  Problem Relation Age of Onset  . Other Mother        died in her sleep at 61  . Hypertension Sister   . Hyperlipidemia Sister   . Diabetes Mellitus II Sister   . Heart disease Sister   . CAD Neg Hx        neg hx premature CAD    Social History:  reports that he quit smoking about 12 years ago. His smoking use included cigarettes. He has never used smokeless tobacco. He reports current alcohol use. He reports that he does not use drugs.  Allergies: No Known Allergies  Medications: Prior to Admission medications   Medication Sig Start Date End Date Taking? Authorizing Provider  aspirin EC 81 MG tablet Take 81 mg by mouth daily. Swallow whole.   Yes [provider]  carvedilol (COREG) 3.125 MG tablet Take 1 tablet (3.125 mg total) by mouth 2 (two) times  daily with a meal. 10/15/20 01/13/21 Yes Dahal, Marlowe Aschoff, MD  ibuprofen (ADVIL) 200 MG tablet Take 200 mg by mouth every 6 (six) hours as needed.   Yes [provider]  pantoprazole (PROTONIX) 40 MG tablet Take 1 tablet (40 mg total) by mouth daily. Patient taking differently: Take 40 mg by mouth 2 (two) times daily. 10/15/20 01/13/21 Yes Dahal, Marlowe Aschoff, MD  calcium carbonate (TUMS - DOSED IN MG ELEMENTAL CALCIUM) 500 MG chewable tablet Chew 1 tablet (200 mg of elemental calcium total) by mouth 3 (three) times daily as needed for indigestion or heartburn. 02/02/21   Shelly Coss, MD  folic acid (FOLVITE) 1 MG tablet Take 1 tablet (1 mg total) by mouth daily. 02/03/21   Shelly Coss, MD  metoprolol tartrate (LOPRESSOR) 25 MG tablet Take 0.5 tablets (12.5 mg total) by mouth 2 (two) times daily. 02/02/21   Shelly Coss, MD  midodrine (PROAMATINE) 10 MG tablet Take 1 tablet (10 mg total) by mouth 3 (three) times daily with  meals. 02/02/21   Shelly Coss, MD  thiamine 100 MG tablet Take 1 tablet (100 mg total) by mouth daily. 02/03/21   Shelly Coss, MD  torsemide (DEMADEX) 20 MG tablet Take 2 tablets (40 mg total) by mouth daily. 02/02/21   Shelly Coss, MD    I have reviewed the patient's current medications.  Labs:  Results for orders placed or performed during the hospital encounter of 01/23/21 (from the past 48 hour(s))  CBC with Differential/Platelet     Status: Abnormal   Collection Time: 02/03/21  4:42 AM  Result Value Ref Range   WBC 4.9 4.0 - 10.5 K/uL   RBC 2.85 (L) 4.22 - 5.81 MIL/uL   Hemoglobin 7.7 (L) 13.0 - 17.0 g/dL   HCT 26.0 (L) 39.0 - 52.0 %   MCV 91.2 80.0 - 100.0 fL   MCH 27.0 26.0 - 34.0 pg   MCHC 29.6 (L) 30.0 - 36.0 g/dL   RDW 21.3 (H) 11.5 - 15.5 %   Platelets 329 150 - 400 K/uL   nRBC 0.0 0.0 - 0.2 %   Neutrophils Relative % 75 %   Neutro Abs 3.7 1.7 - 7.7 K/uL   Lymphocytes Relative 7 %   Lymphs Abs 0.4 (L) 0.7 - 4.0 K/uL   Monocytes Relative 13 %    Monocytes Absolute 0.7 0.1 - 1.0 K/uL   Eosinophils Relative 3 %   Eosinophils Absolute 0.1 0.0 - 0.5 K/uL   Basophils Relative 1 %   Basophils Absolute 0.0 0.0 - 0.1 K/uL   Immature Granulocytes 1 %   Abs Immature Granulocytes 0.04 0.00 - 0.07 K/uL    Comment: Performed at [facility], [place] [address], [place], [place] [address]  Basic metabolic panel     Status: Abnormal   Collection Time: 02/03/21  4:42 AM  Result Value Ref Range   Sodium 133 (L) 135 - 145 mmol/L   Potassium 5.0 3.5 - 5.1 mmol/L   Chloride 95 (L) 98 - 111 mmol/L   CO2 26 22 - 32 mmol/L   Glucose, Bld 109 (H) 70 - 99 mg/dL    Comment: Glucose reference range applies only to samples taken after fasting for at least 8 hours.   BUN 89 (H) 8 - 23 mg/dL   Creatinine, Ser 3.43 (H) 0.61 - 1.24 mg/dL   Calcium 8.4 (L) 8.9 - 10.3 mg/dL   GFR, Estimated 18 (L) >60 mL/min    Comment: (NOTE) Calculated using the CKD-EPI Creatinine Equation (2021)    Anion gap 12 5 - 15    Comment: Performed at [facility], [place] [address] [place], [place] [address]  Sodium, urine, random     Status: None   Collection Time: 02/03/21  9:29 AM  Result Value Ref Range   Sodium, Ur 40 mmol/L    Comment: Performed at [facility], [place] [address], [place], [place] [address]  Creatinine, urine, random     Status: None   Collection Time: 02/03/21  9:29 AM  Result Value Ref Range   Creatinine, Urine 126.28 mg/dL    Comment: Performed at [facility], [place] [address], [place], [place] [address]  Albumin     Status: Abnormal   Collection Time: 02/03/21 11:56 AM  Result Value Ref Range   Albumin 2.5 (L) 3.5 - 5.0 g/dL    Comment: Performed at [facility], [place] [address], [place], [place] [address]     ROS:  Pertinent items noted in HPI and remainder of comprehensive ROS otherwise negative.  Physical Exam: Vitals:   02/03/21 1016 02/03/21 1216   BP: 105/75 102/62  Pulse: 68 66  Resp: 18 14  Temp:    SpO2: 100% 100%     General exam: Ill-looking male sitting on chair, alert oriented Respiratory system: Clear to auscultation. Respiratory effort normal. No wheezing or crackle Cardiovascular system: S1 & S2 heard, RRR.  Gastrointestinal system: Abdomen is nondistended, soft and nontender. Normal bowel sounds heard. Central nervous system: Alert awake, oriented.  No focal deficit Extremities: Lower extremities wrapped with dressing. Skin: No rashes, lesions or ulcers Psychiatry: Judgement and insight appear normal. Mood & affect appropriate.   Assessment/Plan:  #Acute kidney injury on CKD stage IV due to reduce effective renal perfusion caused by hypotension, recent paracentesis x2, IV diuretics use on top of chronic cardiorenal syndrome.  I will check urinalysis, bladder scan to make sure there is no urinary retention although he has indwelling Foley catheter. I agree with holding hydralazine, Imdur and starting midodrine. I will order limited IV fluid with albumin to expand intravascular volume.  The renal function may improve with the addition of inotropic support however, he is not a candidate for inotropes per cardiology. Noted he is DNR. In this situation, he will not tolerate dialysis.  Fortunately, he does not need dialysis at this time and hopefully the kidney function will gradually improve to avoid dialysis. Given multiorgan failure, I recommend palliative care consult.    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  Not tolerating cardiac medication because of hypotension.  Not a candidate for inotropic support given multiple comorbidities per cardiologist.  #Hypotension: Agree with midodrine and IV fluid as above.  Monitor BP.  #Alcoholic liver cirrhosis with recurrent ascites requiring multiple paracentesis.  He had 2 paracentesis in this admission.  #Severe BPH and chronic bladder outlet obstruction on chronic  indwelling Foley catheter.  Getting bladder scan to make sure there is no urinary retention.  Given multiorgan failure with end-stage heart disease, severe CKD, liver disease the prognosis looks poor.  I think he will benefit from palliative care/hospice care consult.   Thank you for the consult.  We will follow with you.  Izzah Pasqua Tanna Furry 02/03/2021, 2:13 PM  [facility].

## 2021-02-03 NOTE — TOC Progression Note (Signed)
Transition of Care [facility]) - Progression Note    Patient Details  Name: [name] MRN: [id number] Date of Birth: Dec 24, 1940  Transition of Care [facility]) CM/SW Contact  Ross Ludwig, [place] Phone Number: 02/03/2021, 4:35 PM  Clinical Narrative:    Patient was supposed to discharge yesterday.  Per physician, patient not medically ready for discharge yet.  CSW to continue to follow patient's progress throughout discharge planning.   Expected Discharge Plan: [place] Barriers to Discharge: Barriers Resolved  Expected Discharge Plan and Services Expected Discharge Plan: [place]   Discharge Planning Services: CM Consult   Living arrangements for the past 2 months: Single Family Home Expected Discharge Date: 02/02/21                         HH Arranged: PT,RN [place] Date HH Agency Contacted: 02/02/21 Time [place]: 1053 Representative spoke with at [place]: [place] Determinants of Health ([place]) Interventions    Readmission Risk Interventions No flowsheet data found.

## 2021-02-03 NOTE — Progress Notes (Signed)
Progress Note  Patient Name: [name] Date of Encounter: 02/03/2021  [facility] Cardiologist: Candee Furbish, MD   Subjective   Has had significant bump in his Cr overnight No cardiac complaints   Inpatient Medications    Scheduled Meds: . calcium carbonate  1 tablet Oral TID WC  . Chlorhexidine Gluconate Cloth  6 each Topical Daily  . folic acid  1 mg Oral Daily  . heparin  5,000 Units Subcutaneous Q8H  . metoprolol tartrate  12.5 mg Oral BID  . midodrine  10 mg Oral TID WC  . multivitamin with minerals  1 tablet Oral Daily  . pantoprazole (PROTONIX) IV  40 mg Intravenous Q24H  . sodium chloride flush  3 mL Intravenous Q12H  . thiamine  100 mg Oral Daily   Or  . thiamine  100 mg Intravenous Daily   Continuous Infusions: . sodium chloride     PRN Meds: sodium chloride, acetaminophen, ondansetron (ZOFRAN) IV, sodium chloride flush   Vital Signs    Vitals:   02/02/21 1240 02/02/21 2007 02/03/21 0455 02/03/21 0606  BP: 118/70 99/70 (!) 87/59   Pulse: 91 67 (!) 58   Resp: 20  20   Temp:  97.6 F (36.4 C) 97.6 F (36.4 C)   TempSrc:  Oral Oral   SpO2: 98% 100% 99%   Weight:    85.7 kg    Intake/Output Summary (Last 24 hours) at 02/03/2021 0832 Last data filed at 02/03/2021 0501 Gross per 24 hour  Intake 0 ml  Output 450 ml  Net -450 ml   Last 3 Weights 02/03/2021 02/02/2021 02/01/2021  Weight (lbs) 188 lb 15 oz 188 lb 0.8 oz 181 lb 8 oz  Weight (kg) 85.7 kg 85.3 kg 82.328 kg  Some encounter information is confidential and restricted. Go to Review Flowsheets activity to see all data.      Telemetry    Sinus rhythm with PVCs.  NSVT up to 9 beats.- Personally Reviewed  ECG    No new tracing today - Personally Reviewed  Physical Exam   Chronically ill black male JVP elevated SEM to apex Icthyosis with chronic plus 2 LE edema And legs wrapped   Labs    High Sensitivity Troponin:   Recent Labs  Lab 01/23/21 2146 01/24/21 0047  TROPONINIHS 41*  42*      Chemistry Recent Labs  Lab 01/30/21 0436 02/01/21 1113 02/03/21 0442  NA 136 136 133*  K 4.3 4.8 5.0  CL 100 98 95*  CO2 30 30 26   GLUCOSE 109* 115* 109*  BUN 63* 75* 89*  CREATININE 2.42* 2.66* 3.43*  CALCIUM 8.2* 8.6* 8.4*  GFRNONAA 27* 24* 18*  ANIONGAP 6 8 12      Hematology Recent Labs  Lab 01/30/21 0436 02/01/21 1113 02/03/21 0442  WBC 4.9 5.5 4.9  RBC 2.78* 2.90* 2.85*  HGB 7.4* 7.7* 7.7*  HCT 24.8* 26.2* 26.0*  MCV 89.2 90.3 91.2  MCH 26.6 26.6 27.0  MCHC 29.8* 29.4* 29.6*  RDW 20.0* 21.1* 21.3*  PLT 288 291 329    BNP No results for input(s): BNP, PROBNP in the last 168 hours.   DDimer No results for input(s): DDIMER in the last 168 hours.   Radiology    No results found.  Cardiac Studies   Echo from 10/14/20:  1. No left ventricular thrombus with Definity contrast. Left ventricular  ejection fraction, by estimation, is <20%. The left ventricle has severely  decreased function. The left  ventricle demonstrates global hypokinesis.  The left ventricular internal cavity size was severely dilated.  2. Right ventricular systolic function is moderately reduced. The right  ventricular size is severely enlarged. There is moderately elevated  pulmonary artery systolic pressure. The estimated right ventricular  systolic pressure is 62.9 mmHg.  3. Left atrial size was severely dilated.  4. Right atrial size was severely dilated.  5. The mitral valve is normal in structure. Mild mitral valve  regurgitation.  6. The aortic valve is tricuspid. Aortic valve regurgitation is not  visualized. Mild aortic valve sclerosis is present, with no evidence of  aortic valve stenosis.   Comparison(s): Prior images reviewed side by side. No thrombus is seen in  the left ventricle with Definity contrast, on the current images.   Patient Profile     80 y.o. male with a complex past medical history of chronic systolic and diastolic heart failure with EF  10-20%, NSVT, LV thrombus 05/2019, recurrent GI bleed, hypertension, hyperlipidemia, type 2 diabetes, CKD stage IV, BPH, chronic lower extremity wound, alcohol abuse, medication noncompliance, COVID-19 infection 09/2020.  Patient presented with dyspnea, anasarca in the setting of running out of torsemide.  Cardiology was consulted and following for decompensated heart failure.   Assessment & Plan    Acute on chronic systolic and diastolic biventricular heart failure End-stage dilated cardiomyopathy -  Known history of DCM since 2013 with non compliance issues both with f/u and medications  Hold nitrates and diuretics With bump in Cr. Given his age, DNR, compliance issues and cirrhosis and low BP I don't think he is a candidate for inotropes lke milrinone   NSVT - improved K/Mg ok continue beta blocker   Alcoholic cirrhosis with recurrent ascites History of EtOH abuse Hx of recurrent GI bleed   -  ? Abstain last year synthetic function INR normal confirmed on [place] had paracentesis by IR 01/27/21 Needs F/u with GI for chronic management    AKI on CKD stage IV Cardiorenal syndrome - elevated 3.4 this am holding diuresis see above not a candidate for milrinone   History of LV thrombus on Echo 2020 -Historically took Coumadin, this was stopped due to recurrent GI bleed, given high risk of bleeding and resolved thrombus on Echo 09/2020, no recommendation for therapeutic anticoagulation at this time   Hypertension - BP low, stopped hydralazine and isordil, low BP may contribute to ATN in setting of diuresis   Type 2 diabetes BPH Anemia of chronic disease  - management per primary team   Disposition: Will arrange appointment with advanced heart failure clinic in 2-3 weeks and he is scheduled with CHF heart impact clinic on 02/06/21 at 8 AM.    For questions or updates, please contact [facility] Please consult [email or web address] for contact info under    Signed, Jenkins Rouge, MD   02/03/2021, 8:32 AM

## 2021-02-03 NOTE — Progress Notes (Signed)
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 He was found to be severely volume overloaded on presentation and was started on IV Lasix. Cardiology also consulted and following.Volume overload has significantly improved during this hospitalization, he also underwent paracentesis twice.  He was started on midodrine for hypotension. He was planned for discharge on 02/02/2021 but he developed nausea and vomiting so discharge canceled.  Lab work on 02/03/2021 showed new onset AKI.    Nephrology consulted today.  Assessment & Plan:   Principal Problem:   CHF exacerbation ([place]) Active Problems:   Diabetes mellitus without complication ([place])   Alcohol abuse   Essential hypertension   CKD (chronic kidney disease), stage IV (HCC)   Pressure injury of skin   Volume overload/severe systolic congestive heart failure: Secondary to acute on chronic systolic heart failure/congestive cardiomyopathy. Anasarca also contributed by liver cirrhosis/hypoalbuminemia. Echocardiogram showed ejection fraction of 10-20% Cardiology were following. Started on IV diuretics,was on torsemide 40 mg daily,now held due to  AKI.  Stopped hydralazine, Imdur because of low blood pressure. On low dose metoprolol.Contnue compression wraps for bilateral lower extremity edema.  He will follow-up with heart failure clinic.  AKI vs CKD stage LX:BWIOMB function improved with diuresis.  His baseline creatinine is around 2.5.  Developed  nausea and vomiting on 02/02/2021 and also became hypotensive overnight, creatinine in the range of 3 today.  We have consulted nephrology.  We will check urine sodium and creatinine.  We might consider IV fluids/albumin,but will wait for nephrology evaluation.  He needs to follow-up with nephrology as an outpatient.  Referral sent to [facility].  Nausea/vomiting/abdominal discomfort: Continue Zofran as needed, Protonix.  These symptoms are better today.  Ascites: History of liver cirrhosis from alcohol. Might also have been contributed by congestive hepatopathy. Status post paracentesis with removal of 7 L of fluid on admission. Has been getting paracentesis once a month for the last 3 months. He will definitely need recurrent paracentesis in the future. He also needs to follow up with GI as an outpatient. Underwent another session of paracentesis on 5/31 with removal of 4.3 L of fluid. [facility] [place] [facility] arranged  a follow-up appointment as an outpatient so that he can be referred fo paracentesis to IR/radiology  in the future.he has an an appointment with Vicie Mutters on 6/16 at 9: 30 am  Hypotension:Imdur, hydralazine discontinued.  Started on midodrine  Chest pain/Vatch: He had left-sided chest pain on 01/31/21,now resolved. EKG did not show any acute ischemic changes. Has intermittent episodes of nonsustained V. tach.  Left ventricular thrombus: Not a candidate for anticoagulation due to noncompliance, GI bleed  Normocytic anemia: Hemoglobin is in the range of 7. Anemia is most likely associated with CKD. No evidence of acute blood loss. he has history of  GI bleed. Normal iron as per iron studies.  Needs to monitor   as an outpatient  Severe BPH/chronic bladder outlet obstruction: On chronic indwelling Foley catheter. Changed monthly.  Chronic alcoholism: Reports quitting drinking 6 months ago, not sure if this is true. No signs of withdrawal.  Continue thiamine, folic acid.  Chronic bilateral lower extremity wound: Follows at wound care center,Dr Dellia Nims. Wound care consulted here  Debility/deconditioning/goals of care:Palliative care also following during this hospitalization and recommend palliative care follow-up as an outpatient when he is discharged. PT/OT recommended home health on discharge.     DVT prophylaxis:Heparin [place] Code Status: DNR Family Communication: Called sister for update on 02/02/21 Status is: Inpatient  Remains inpatient appropriate because:Inpatient level of care appropriate due to severity of illness   Dispo: The patient is from: Home              Anticipated d/c is to: Home tomorrow if improvement in the kidney function, blood pressure              Patient currently is not medically stable to d/c.   Difficult to place patient No     Consultants: Cardiology  Procedures:Paracentesis  Antimicrobials:  Anti-infectives (From admission, onward)   None      Subjective:  Patient seen and examined at the bedside this morning.  Blood pressure was soft but improved since last night.  His nausea and vomiting symptoms have improved.  Still very weak.  Sitting at the edge of the bed.  Denies any new complaints.  Objective: Vitals:   02/02/21 1240 02/02/21 2007 02/03/21 0455 02/03/21 0606  BP: 118/70 99/70 (!) 87/59   Pulse: 91 67 (!) 58   Resp: 20  20   Temp:  97.6 F (36.4 C) 97.6 F (36.4 C)   TempSrc:  Oral Oral   SpO2: 98% 100% 99%   Weight:    85.7 kg    Intake/Output Summary (Last 24 hours) at 02/03/2021 0753 Last data filed at 02/03/2021 0501 Gross per 24 hour  Intake 0 ml  Output  450 ml  Net -450 ml   Filed Weights   02/01/21 0344 02/02/21 0544 02/03/21 0606  Weight: 82.3 kg 85.3 kg 85.7 kg    Examination:  General exam: Very deconditioned, debilitated, chronically looking, pleasant elderly male Respiratory system:  no wheezes or crackles, diminished air sounds bilaterally Cardiovascular system: S1 & S2 heard, RRR.  Gastrointestinal system: Abdomen is  soft and nontender.  Ascites Central nervous system: Alert and oriented Extremities: Trace bilateral lower extremity edema Skin: Bilateral lower extremity ulcers covered with dressings  Data Reviewed: I have personally reviewed following labs and imaging studies  CBC: Recent Labs  Lab 01/28/21 0441 01/30/21 0436 02/01/21 1113 02/03/21 0442  WBC 5.1 4.9 5.5 4.9  NEUTROABS 4.0 3.8 4.5 3.7  HGB 7.8* 7.4* 7.7* 7.7*  HCT 26.2* 24.8* 26.2* 26.0*  MCV 89.1 89.2 90.3 91.2  PLT 264 288 291 240   Basic Metabolic Panel: Recent Labs  Lab 01/27/21 2317 01/28/21 0441 01/29/21 0441 01/30/21 0430 01/30/21 0436 02/01/21 1113 02/03/21 0442  NA 142 136 137  --  136 136 133*  K 3.8 4.0 3.9  --  4.3 4.8 5.0  CL 104 101 101  --  100 98 95*  CO2 32 27 29  --  30 30 26   GLUCOSE 142* 89 97  --  109* 115* 109*  BUN 60* 56* 60*  --  63* 75* 89*  CREATININE 2.70* 2.37* 2.46*  --  2.42* 2.66* 3.43*  CALCIUM 8.4* 8.0* 8.2*  --  8.2* 8.6* 8.4*  MG 2.1  --   --  1.9  --  2.4  --    GFR: Estimated Creatinine Clearance: 19.2 mL/min (A) (by C-G formula based on SCr of 3.43 mg/dL (H)). Liver Function Tests: No results for input(s): AST, ALT, ALKPHOS, BILITOT, PROT, ALBUMIN in the last 168 hours. No results for input(s): LIPASE, AMYLASE in the last 168 hours. No results for input(s): AMMONIA in the last 168 hours. Coagulation Profile: Recent Labs  Lab 01/27/21 1627  INR 1.2   Cardiac Enzymes: No results for input(s): CKTOTAL, CKMB, CKMBINDEX, TROPONINI in the last 168 hours. BNP (last 3 results) No results for  input(s): PROBNP in the last 8760 hours. HbA1C: No results for input(s): HGBA1C in the last 72 hours. CBG: No results for input(s): GLUCAP in the last 168 hours. Lipid Profile: No results for input(s): CHOL, HDL, LDLCALC, TRIG, CHOLHDL, LDLDIRECT in the last 72 hours. Thyroid Function Tests: No results for input(s): TSH, T4TOTAL, FREET4, T3FREE, THYROIDAB in the last 72 hours. Anemia Panel: No results for input(s): VITAMINB12, FOLATE, FERRITIN, TIBC, IRON, RETICCTPCT in the last 72 hours. Sepsis Labs: No results for input(s): PROCALCITON, LATICACIDVEN in the last 168 hours.  No results found for this or any previous visit (from the past 240 hour(s)).       Radiology Studies: No results found.      Scheduled Meds: . calcium carbonate  1 tablet Oral TID WC  . Chlorhexidine Gluconate Cloth  6 each Topical Daily  . folic acid  1 mg Oral Daily  . heparin  5,000 Units Subcutaneous Q8H  . metoprolol tartrate  12.5 mg Oral BID  . midodrine  10 mg Oral TID WC  . multivitamin with minerals  1 tablet Oral Daily  . pantoprazole (PROTONIX) IV  40 mg Intravenous Q24H  . sodium chloride flush  3 mL Intravenous Q12H  . thiamine  100 mg Oral Daily   Or  . thiamine  100 mg Intravenous Daily   Continuous Infusions: . sodium chloride       LOS: 10 days    Time spent: 25 mins.More than 50% of that time was spent in counseling and/or coordination of care.      Shelly Coss, MD [facility] P6/02/2021, 7:53 AM

## 2021-02-04 DIAGNOSIS — R531 Weakness: Secondary | ICD-10-CM

## 2021-02-04 DIAGNOSIS — Z515 Encounter for palliative care: Secondary | ICD-10-CM

## 2021-02-04 DIAGNOSIS — Z7189 Other specified counseling: Secondary | ICD-10-CM

## 2021-02-04 LAB — RENAL FUNCTION PANEL
Albumin: 3.1 g/dL — ABNORMAL LOW (ref 3.5–5.0)
Anion gap: 11 (ref 5–15)
BUN: 90 mg/dL — ABNORMAL HIGH (ref 8–23)
CO2: 24 mmol/L (ref 22–32)
Calcium: 8.4 mg/dL — ABNORMAL LOW (ref 8.9–10.3)
Chloride: 99 mmol/L (ref 98–111)
Creatinine, Ser: 3.98 mg/dL — ABNORMAL HIGH (ref 0.61–1.24)
GFR, Estimated: 15 mL/min — ABNORMAL LOW (ref >60)
Glucose, Bld: 86 mg/dL (ref 70–99)
Phosphorus: 5.3 mg/dL — ABNORMAL HIGH (ref 2.5–4.6)
Potassium: 5.9 mmol/L — ABNORMAL HIGH (ref 3.5–5.1)
Sodium: 134 mmol/L — ABNORMAL LOW (ref 135–145)

## 2021-02-04 MED ORDER — SODIUM ZIRCONIUM CYCLOSILICATE 10 G PO PACK
10.0000 g | PACK | Freq: Two times a day (BID) | ORAL | Status: AC
  Administered 2021-02-04 (×2): 10 g via ORAL
  Filled 2021-02-04 (×2): qty 1

## 2021-02-04 NOTE — Progress Notes (Signed)
Progress Note  Patient Name: [name] Date of Encounter: 02/04/2021  [facility] Cardiologist: Candee Furbish, MD   Subjective   Some nausea/anorexia taking mostly liquids   Inpatient Medications    Scheduled Meds: . calcium carbonate  1 tablet Oral TID WC  . Chlorhexidine Gluconate Cloth  6 each Topical Daily  . folic acid  1 mg Oral Daily  . heparin  5,000 Units Subcutaneous Q8H  . metoprolol tartrate  12.5 mg Oral BID  . midodrine  10 mg Oral TID WC  . multivitamin with minerals  1 tablet Oral Daily  . pantoprazole (PROTONIX) IV  40 mg Intravenous Q24H  . sodium chloride flush  3 mL Intravenous Q12H  . sodium zirconium cyclosilicate  10 g Oral BID  . thiamine  100 mg Oral Daily   Continuous Infusions: . sodium chloride     PRN Meds: sodium chloride, acetaminophen, ondansetron (ZOFRAN) IV, oxyCODONE, sodium chloride flush   Vital Signs    Vitals:   02/04/21 0500 02/04/21 0602 02/04/21 0845 02/04/21 0846  BP:  105/63 110/62   Pulse:  73  74  Resp:  20    Temp:  97.6 F (36.4 C)    TempSrc:  Oral    SpO2:  100%    Weight: 87 kg       Intake/Output Summary (Last 24 hours) at 02/04/2021 0918 Last data filed at 02/04/2021 0600 Gross per 24 hour  Intake 1370 ml  Output --  Net 1370 ml   Last 3 Weights 02/04/2021 02/03/2021 02/02/2021  Weight (lbs) 191 lb 12.8 oz 188 lb 15 oz 188 lb 0.8 oz  Weight (kg) 87 kg 85.7 kg 85.3 kg  Some encounter information is confidential and restricted. Go to Review Flowsheets activity to see all data.      Telemetry    Sinus rhythm with PVCs.  NSVT up to 9 beats.- Personally Reviewed  ECG    No new tracing today - Personally Reviewed  Physical Exam   Chronically ill black male JVP elevated SEM to apex Icthyosis with chronic plus 2 LE edema And legs wrapped   Labs    High Sensitivity Troponin:   Recent Labs  Lab 01/23/21 2146 01/24/21 0047  TROPONINIHS 41* 42*      Chemistry Recent Labs  Lab 02/01/21 1113  02/03/21 0442 02/03/21 1156 02/04/21 0520  NA 136 133*  --  134*  K 4.8 5.0  --  5.9*  CL 98 95*  --  99  CO2 30 26  --  24  GLUCOSE 115* 109*  --  86  BUN 75* 89*  --  90*  CREATININE 2.66* 3.43*  --  3.98*  CALCIUM 8.6* 8.4*  --  8.4*  ALBUMIN  --   --  2.5* 3.1*  GFRNONAA 24* 18*  --  15*  ANIONGAP 8 12  --  11     Hematology Recent Labs  Lab 01/30/21 0436 02/01/21 1113 02/03/21 0442  WBC 4.9 5.5 4.9  RBC 2.78* 2.90* 2.85*  HGB 7.4* 7.7* 7.7*  HCT 24.8* 26.2* 26.0*  MCV 89.2 90.3 91.2  MCH 26.6 26.6 27.0  MCHC 29.8* 29.4* 29.6*  RDW 20.0* 21.1* 21.3*  PLT 288 291 329    BNP No results for input(s): BNP, PROBNP in the last 168 hours.   DDimer No results for input(s): DDIMER in the last 168 hours.   Radiology    No results found.  Cardiac Studies   Echo  from 10/14/20:  1. No left ventricular thrombus with Definity contrast. Left ventricular  ejection fraction, by estimation, is <20%. The left ventricle has severely  decreased function. The left ventricle demonstrates global hypokinesis.  The left ventricular internal cavity size was severely dilated.  2. Right ventricular systolic function is moderately reduced. The right  ventricular size is severely enlarged. There is moderately elevated  pulmonary artery systolic pressure. The estimated right ventricular  systolic pressure is 46.2 mmHg.  3. Left atrial size was severely dilated.  4. Right atrial size was severely dilated.  5. The mitral valve is normal in structure. Mild mitral valve  regurgitation.  6. The aortic valve is tricuspid. Aortic valve regurgitation is not  visualized. Mild aortic valve sclerosis is present, with no evidence of  aortic valve stenosis.   Comparison(s): Prior images reviewed side by side. No thrombus is seen in  the left ventricle with Definity contrast, on the current images.   Patient Profile     80 y.o. male with a complex past medical history of chronic  systolic and diastolic heart failure with EF 10-20%, NSVT, LV thrombus 05/2019, recurrent GI bleed, hypertension, hyperlipidemia, type 2 diabetes, CKD stage IV, BPH, chronic lower extremity wound, alcohol abuse, medication noncompliance, COVID-19 infection 09/2020.  Patient presented with dyspnea, anasarca in the setting of running out of torsemide.  Cardiology was consulted and following for decompensated heart failure.   Assessment & Plan    Acute on chronic systolic and diastolic biventricular heart failure End-stage dilated cardiomyopathy -  Known history of DCM since 2013 with non compliance issues both with f/u and medications  Hold nitrates and diuretics With bump in Cr. Given his age, DNR, compliance issues and cirrhosis and low BP I don't think he is a candidate for inotropes lke milrinone Cr worse again this am but patient making urine   NSVT - improved K/Mg ok continue beta blocker   Alcoholic cirrhosis with recurrent ascites History of EtOH abuse Hx of recurrent GI bleed   -  ? Abstain last year synthetic function INR normal confirmed on [place] had paracentesis by IR 01/27/21 Needs F/u with GI for chronic management    AKI on CKD stage IV Cardiorenal syndrome - elevated 3.98 this am holding diuresis see above not a candidate for milrinone   History of LV thrombus on Echo 2020 -Historically took Coumadin, this was stopped due to recurrent GI bleed, given high risk of bleeding and resolved thrombus on Echo 09/2020, no recommendation for therapeutic anticoagulation at this time   Hypertension - BP low, stopped hydralazine and isordil, low BP may contribute to ATN in setting of diuresis   Type 2 diabetes BPH Anemia of chronic disease  - management per primary team   Disposition: Will arrange appointment with advanced heart failure clinic in 2-3 weeks and he is scheduled with CHF heart impact clinic on 02/06/21 at 8 AM.   Cardiology will sign off     For questions or updates,  please contact [facility] Please consult [email or web address] for contact info under    Signed, Jenkins Rouge, MD  02/04/2021, 9:18 AM

## 2021-02-04 NOTE — Progress Notes (Addendum)
[name] NEPHROLOGY PROGRESS NOTE  Assessment/ Plan: Pt is a 80 y.o. yo male  with past medical history of hypertension, HLD, PVD, chronic systolic CHF with EF less than 22%, alcoholic liver cirrhosis with recurrent ascites requiring paracentesis, history of LV thrombus not on anticoagulation due to GI bleed, CKD stage IV, BPH with chronic indwelling Foley catheter who was admitted for peripheral edema/anasarca, seen as a consultation for the evaluation of AKI on CKD.  #Acute kidney injury on CKD stage IV due to  cardiorenal syndrome concomitant with hypotension, recent paracentesis x2, IV diuretics.  He has indwelling Foley catheter and noted around 500 cc of urine in the bladder.  We tried IV fluid with IV albumin to expand intravascular volume without much help.  The creatinine level continued to worsen associated with hyperkalemia.  Given end-stage cardiac disease for which not a candidate for inotropic treatment, liver disease and multiple comorbidities, he will not tolerate outpatient dialysis treatment.  At this point, I recommend him hospice care management.  I reviewed this with the patient and it seems like he understands the situation and reports to me that he already contacted hospice care and plan to go to [facility] to stay with his nephew. Nothing much to offer from renal perspective.  Continue Lokelma every day.  Please call us back with question.  #Hyperkalemia: Continue Lokelma and low potassium diet.  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  Not tolerating cardiac medication because of hypotension.  Not a candidate for inotropic support given multiple comorbidities per cardiologist.  #Hypotension: Continue midodrine.  Monitor BP.  #Alcoholic liver cirrhosis with recurrent ascites requiring multiple paracentesis.  He had 2 paracentesis in this admission.  #Severe BPH and chronic bladder outlet obstruction on chronic indwelling Foley catheter.     #GOC: As discussed above.  Patient is DNR.  Discussed with the patient and he is agreeable for hospice care after discharge.  Continue palliative care.  Discussed with the primary team and bedside nurse.  Subjective: Seen and examined at bedside.  No urine output recorded however noticed around 500 cc urine in the urinary bag.  He denies nausea, vomiting, chest pain, shortness of breath. Objective Vital signs in last 24 hours: Vitals:   02/04/21 0500 02/04/21 0602 02/04/21 0845 02/04/21 0846  BP:  105/63 110/62   Pulse:  73  74  Resp:  20    Temp:  97.6 F (36.4 C)    TempSrc:  Oral    SpO2:  100%    Weight: 87 kg      Weight change: 1.3 kg  Intake/Output Summary (Last 24 hours) at 02/04/2021 1245 Last data filed at 02/04/2021 0945 Gross per 24 hour  Intake 1370 ml  Output --  Net 1370 ml       Labs: Basic Metabolic Panel: Recent Labs  Lab 02/01/21 1113 02/03/21 0442 02/04/21 0520  NA 136 133* 134*  K 4.8 5.0 5.9*  CL 98 95* 99  CO2 30 26 24   GLUCOSE 115* 109* 86  BUN 75* 89* 90*  CREATININE 2.66* 3.43* 3.98*  CALCIUM 8.6* 8.4* 8.4*  PHOS  --   --  5.3*   Liver Function Tests: Recent Labs  Lab 02/03/21 1156 02/04/21 0520  ALBUMIN 2.5* 3.1*   No results for input(s): LIPASE, AMYLASE in the last 168 hours. No results for input(s): AMMONIA in the last 168 hours. CBC: Recent Labs  Lab 01/30/21 0436 02/01/21 1113 02/03/21 0442  WBC 4.9  5.5 4.9  NEUTROABS 3.8 4.5 3.7  HGB 7.4* 7.7* 7.7*  HCT 24.8* 26.2* 26.0*  MCV 89.2 90.3 91.2  PLT 288 291 329   Cardiac Enzymes: No results for input(s): CKTOTAL, CKMB, CKMBINDEX, TROPONINI in the last 168 hours. CBG: No results for input(s): GLUCAP in the last 168 hours.  Iron Studies: No results for input(s): IRON, TIBC, TRANSFERRIN, FERRITIN in the last 72 hours. Studies/Results: No results found.  Medications: Infusions: . sodium chloride      Scheduled Medications: . calcium carbonate  1 tablet Oral  TID WC  . Chlorhexidine Gluconate Cloth  6 each Topical Daily  . folic acid  1 mg Oral Daily  . heparin  5,000 Units Subcutaneous Q8H  . metoprolol tartrate  12.5 mg Oral BID  . midodrine  10 mg Oral TID WC  . multivitamin with minerals  1 tablet Oral Daily  . pantoprazole (PROTONIX) IV  40 mg Intravenous Q24H  . sodium chloride flush  3 mL Intravenous Q12H  . sodium zirconium cyclosilicate  10 g Oral BID  . thiamine  100 mg Oral Daily    have reviewed scheduled and prn medications.  Physical Exam: General:NAD, comfortable Heart:RRR, s1s2 nl Lungs:clear b/l, no crackle Abdomen:soft, Non-tender, distended Extremities: Lower extremities wrapped with dressing. Neurology: Alert awake and oriented  [name] Tanna Furry 02/04/2021,12:45 PM  LOS: 11 days

## 2021-02-04 NOTE — TOC Progression Note (Signed)
Transition of Care [facility]) - Progression Note    Patient Details  Name: [name] MRN: [id number] Date of Birth: 28-May-1941  [facility] CM/SW Contact  Ross Ludwig, [place] Phone Number: 02/04/2021, 3:11 PM  Clinical Narrative:     Patient is currently being followed by [facility] for outpatient palliative.  Patient plans to go to his nephews house with home health through [place] for outpatient.  CSW updated [facility] that patient may be interested to eventually switching to hospice at home but not at this time.   Expected Discharge Plan: [place] Barriers to Discharge: Barriers Resolved  Expected Discharge Plan and Services Expected Discharge Plan: [place]   Discharge Planning Services: CM Consult   Living arrangements for the past 2 months: Single Family Home Expected Discharge Date: 02/02/21                         HH Arranged: PT,RN [place] Date HH Agency Contacted: 02/02/21 Time [place]: 1053 Representative spoke with at [place]: [place] Determinants of Health ([place]) Interventions    Readmission Risk Interventions No flowsheet data found.

## 2021-02-04 NOTE — Progress Notes (Signed)
Daily Progress Note   Patient Name: [name]       Date: 02/04/2021 DOB: [birth year]  Age: 80 y.o. MRN#: [id number] Attending Physician: Shelly Coss, MD Primary Care Physician: [name], MD Admit Date: 01/23/2021  Reason for Consultation/Follow-up: Establishing goals of care  Subjective: Patient is resting in bed, denies chest discomfort or shortness of breath.  Patient is aware of the serious nature of his condition.  He states that he has " bad heart and bad kidneys, it runs in my family. " Length of Stay: 11  Current Medications: Scheduled Meds:  . calcium carbonate  1 tablet Oral TID WC  . Chlorhexidine Gluconate Cloth  6 each Topical Daily  . folic acid  1 mg Oral Daily  . heparin  5,000 Units Subcutaneous Q8H  . metoprolol tartrate  12.5 mg Oral BID  . midodrine  10 mg Oral TID WC  . multivitamin with minerals  1 tablet Oral Daily  . pantoprazole (PROTONIX) IV  40 mg Intravenous Q24H  . sodium chloride flush  3 mL Intravenous Q12H  . sodium zirconium cyclosilicate  10 g Oral BID  . thiamine  100 mg Oral Daily    Continuous Infusions: . sodium chloride      PRN Meds: sodium chloride, acetaminophen, ondansetron (ZOFRAN) IV, oxyCODONE, sodium chloride flush  Physical Exam         Patient is resting in bed Decreased peripheral edema No distress Regular work of breathing S1-S2 Bilateral lower extremities with dressing and edema  Vital Signs: BP 109/62 (BP Location: Right Arm)   Pulse (!) 56   Temp (!) 97.5 F (36.4 C) (Oral)   Resp 20   Wt 87 kg   SpO2 100%   BMI 26.75 kg/m  SpO2: SpO2: 100 % O2 Device: O2 Device: Room Air O2 Flow Rate:    Intake/output summary:   Intake/Output Summary (Last 24 hours) at 02/04/2021 1517 Last data filed at  02/04/2021 1314 Gross per 24 hour  Intake 1490 ml  Output --  Net 1490 ml   LBM: Last BM Date: 02/03/21 Baseline Weight: Weight: 91.9 kg Most recent weight: Weight: 87 kg       Palliative Assessment/Data: Palliative performance scale 40%.     Patient Active Problem List   Diagnosis Date Noted  .  Pressure injury of skin 01/29/2021  . CHF exacerbation ([place]) 01/24/2021  . Volume overload 10/12/2020  . GERD without esophagitis 10/11/2020  . 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 Cirrhosis of liver with ascites ([place]) 10/11/2020  . Chronic systolic CHF (congestive heart failure) ([place])   . Symptomatic anemia   . Multiple gastric ulcers   . CKD (chronic kidney disease), stage IV ([place]) 05/15/2020  . Chest pain 05/15/2020  . Palliative care encounter   . Acute on chronic congestive heart failure ([place]) 02/04/2019  . Anemia of chronic disease 03/08/2018  . Acute cholecystitis 11/23/2017  . Renal lesion 11/23/2017  . Alcohol abuse 02/26/2015  . Mixed hyperlipidemia   . Diabetes mellitus without complication ([place])   . Acute on chronic combined systolic and diastolic CHF (congestive heart failure) ([place])   . 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  Differences between hospice and palliative discussed.  Patient expresses interest in going to his nephew's home in [place], [place].  His nephew [name] [phone] is a his sister and  HCPOA [name]'s son.  Appreciate TOC consult, patient being assigned to [facility] care and will benefit from having hospice services at home.  Code Status:    Code Status Orders  (From admission, onward)         Start     Ordered   01/25/21 1032  Do not attempt resuscitation (DNR)  Continuous       Question Answer Comment  In the event of cardiac or respiratory ARREST Do not call a "code blue"   In the event of cardiac or respiratory ARREST Do not perform Intubation, CPR, defibrillation or ACLS   In the event of cardiac or respiratory ARREST Use medication by any route, position, wound care, and other measures to relive pain and suffering. May use oxygen, suction and manual treatment of airway obstruction as needed for comfort.      01/25/21 1031        Code Status History    Date Active Date Inactive Code Status Order ID Comments User Context   01/24/2021 0156 01/25/2021 1031 Full Code [id number]  Shela Leff, MD ED   10/11/2020 2023 10/15/2020 2106 Full Code [id number]  Vernelle Emerald, MD ED   05/15/2020 1332 05/22/2020 1946 Full Code [id number]  Harold Hedge, MD ED   06/23/2019 1236 07/09/2019 1623 DNR [id number]  Edwin Dada, MD Inpatient   06/19/2019 0141 06/23/2019 1236 Full Code [id number]  Jani Gravel, MD ED   02/04/2019 1216 02/12/2019 1720 Full Code [id number]  Kathi Ludwig, MD ED   11/23/2017 2118 11/26/2017 2039 Full Code [id number]  Vianne Bulls, MD ED   02/26/2015 0646 02/27/2015 1859 Full Code [id number]  Ivor Costa, MD Inpatient   Advance Care Planning Activity       Prognosis:   ?less than 6 months.   Discharge Planning:  Home with hospice or Palliative Services as per patient preference.   Care plan was discussed with patient and sister HCPOA [name].   Thank you for allowing  the Palliative Medicine Team to assist in the care of this patient.   Time In: 1430 Time Out: 1505 Total Time 35 Prolonged Time Billed  no       Greater  than 50%  of this time was spent counseling and coordinating care related to the above assessment and plan.  Loistine Chance, MD  Please contact Palliative Medicine Team phone at [phone] for questions and concerns.

## 2021-02-04 NOTE — Progress Notes (Addendum)
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 He was found to be severely volume overloaded on presentation and was started on IV Lasix. Cardiology also consulted and following.Volume overload has significantly improved during this hospitalization, he also underwent paracentesis twice.  He was started on midodrine for hypotension. He was planned for discharge on 02/02/2021 but he developed nausea and vomiting so discharge canceled.  Lab work on 02/03/2021 showed new onset AKI on CKD.    Nephrology consulted and following .  Assessment & Plan:   Principal Problem:   CHF exacerbation ([place]) Active Problems:   Diabetes mellitus without complication ([place])   Alcohol abuse   Essential hypertension   CKD (chronic kidney disease), stage IV ([facility])   Pressure injury of skin   Volume overload/severe systolic congestive heart failure: Secondary to acute on chronic systolic heart failure/congestive cardiomyopathy. Anasarca also contributed by liver cirrhosis/hypoalbuminemia. Echocardiogram showed ejection fraction of 10-20% Cardiology were following. Started on IV diuretics,was on torsemide 40 mg  daily,now held due to AKI.  Stopped hydralazine, Imdur because of low blood pressure. On low dose metoprolol.Contnue compression wraps for bilateral lower extremity edema.  He will follow-up with heart failure clinic.  AKI vs CKD stage IV/hyperkalemia:Kidney function had previously  improved with diuresis.  His baseline creatinine is around 2.5.  Developed  nausea and vomiting on 02/02/2021 and also became hypotensive overnight, creatinine in the range of 3 .  We have consulted nephrology.  Creatinine is trending up, has been given albumin by nephrology.  Continue midodrine.  Started on [facility] for hyperkalemia  Nausea/vomiting/abdominal discomfort: Continue Zofran as needed, Protonix.   Ascites: History of liver cirrhosis from alcohol. Might also have been contributed by congestive hepatopathy. Status post paracentesis with removal of 7 L of fluid on admission. Has been getting paracentesis once a month for the last 3 months. He will definitely need recurrent paracentesis in the future. He also needs to follow up with GI as an outpatient. Underwent another session of paracentesis on 5/31 with removal of 4.3 L of fluid. [facility] arranged  a follow-up appointment as an outpatient so that he can be referred fo paracentesis to IR/radiology  in the future.he has an an appointment with Vicie Mutters on 6/16 at 9: 30 am  Hypotension:Imdur, hydralazine discontinued.  On  midodrine  Chest pain/Vatch: He had left-sided chest pain on 01/31/21,now resolved. EKG did not show any acute ischemic changes. Has intermittent episodes of nonsustained V. tach.  Left ventricular thrombus: Not a candidate for anticoagulation due to noncompliance, GI bleed  Normocytic anemia: Hemoglobin is in the range of 7. Anemia is most likely associated with CKD. No evidence of acute blood loss. he has history of GI bleed. Normal iron as per iron studies.   Needs to monitor   as an  outpatient  Severe BPH/chronic  bladder outlet obstruction: On chronic indwelling Foley catheter. Changed monthly.  Chronic alcoholism: Reports quitting drinking 6 months ago, not sure if this is true. No signs of withdrawal.  Continue thiamine, folic acid.  Chronic bilateral lower extremity wound: Follows at wound care center,Dr Dellia Nims. Wound care consulted here  Debility/deconditioning/goals of care:Palliative care were also following during this hospitalization and recommend palliative care follow-up as an outpatient when he is discharged. PT/OT recommended home health on discharge.DNR. Extensive goals of care discussed.  He is not interested on home hospice or skilled nursing facility.  His goal is to go home with therapy.  I have reconsulted palliative care due to his worsening kidney function, noncandidacy for dialysis.     DVT prophylaxis:Heparin [place] Code Status: DNR Family Communication: Called sister for update on 02/04/21 Status is: Inpatient  Remains inpatient appropriate because:Inpatient level of care appropriate due to severity of illness   Dispo: The patient is from: Home              Anticipated d/c is to: Home tomorrow if improvement in the kidney function, nausea/vomiting              Patient currently is not medically stable to d/c.   Difficult to place patient No     Consultants: Cardiology  Procedures:Paracentesis  Antimicrobials:  Anti-infectives (From admission, onward)   None      Subjective:  Patient seen and examined at bedside this morning.  Lying on the bed.  Complains of nausea today but has not vomited yet.  Blood pressure soft but stable.  He continues to refuse rehab options, we also discussed about hospice at home but he declines and says that his goal is to go home  Objective: Vitals:   02/03/21 2133 02/03/21 2159 02/04/21 0500 02/04/21 0602  BP: 102/65 (!) 108/59  105/63  Pulse: 62 64  73  Resp: 18   20  Temp: 98.5 F (36.9 C)    97.6 F (36.4 C)  TempSrc:    Oral  SpO2: 100%   100%  Weight:   87 kg     Intake/Output Summary (Last 24 hours) at 02/04/2021 0824 Last data filed at 02/04/2021 0600 Gross per 24 hour  Intake 1370 ml  Output --  Net 1370 ml   Filed Weights   02/02/21 0544 02/03/21 0606 02/04/21 0500  Weight: 85.3 kg 85.7 kg 87 kg    Examination:    General exam: Very deconditioned, debilitated, chronically looking, pleasant elderly male HEENT: PERRL Respiratory system: Diminished air sounds bilaterally, no wheezes or crackles Cardiovascular system: S1 & S2 heard, RRR.  Gastrointestinal system: Abdomen is distended, nontender.  Ascites Central nervous system: Alert and oriented Extremities: Trace bilateral lower extremity edema, bilateral lower extremity wrapped with dressings Skin :multiple ulcers on the bilateral lower extremities wrapped with dressings  Data Reviewed: I have personally reviewed following labs and imaging studies  CBC: Recent Labs  Lab 01/30/21 0436 02/01/21 1113 02/03/21 0442  WBC 4.9 5.5 4.9  NEUTROABS 3.8 4.5 3.7  HGB 7.4* 7.7* 7.7*  HCT 24.8* 26.2* 26.0*  MCV 89.2 90.3 91.2  PLT 288 291 102   Basic Metabolic Panel: Recent Labs  Lab 01/29/21 0441 01/30/21 0430 01/30/21 0436 02/01/21 1113 02/03/21 0442 02/04/21 0520  NA 137  --  136 136 133* 134*  K 3.9  --  4.3 4.8 5.0 5.9*  CL 101  --  100 98 95* 99  CO2 29  --  30 30  26 24  GLUCOSE 97  --  109* 115* 109* 86  BUN 60*  --  63* 75* 89* 90*  CREATININE 2.46*  --  2.42* 2.66* 3.43* 3.98*  CALCIUM 8.2*  --  8.2* 8.6* 8.4* 8.4*  MG  --  1.9  --  2.4  --   --   PHOS  --   --   --   --   --  5.3*   GFR: Estimated Creatinine Clearance: 16.6 mL/min (A) (by C-G formula based on SCr of 3.98 mg/dL (H)). Liver Function Tests: Recent Labs  Lab 02/03/21 1156 02/04/21 0520  ALBUMIN 2.5* 3.1*   No results for input(s): LIPASE, AMYLASE in the last 168 hours. No results for input(s): AMMONIA in the last  168 hours. Coagulation Profile: No results for input(s): INR, PROTIME in the last 168 hours. Cardiac Enzymes: No results for input(s): CKTOTAL, CKMB, CKMBINDEX, TROPONINI in the last 168 hours. BNP (last 3 results) No results for input(s): PROBNP in the last 8760 hours. HbA1C: No results for input(s): HGBA1C in the last 72 hours. CBG: No results for input(s): GLUCAP in the last 168 hours. Lipid Profile: No results for input(s): CHOL, HDL, LDLCALC, TRIG, CHOLHDL, LDLDIRECT in the last 72 hours. Thyroid Function Tests: No results for input(s): TSH, T4TOTAL, FREET4, T3FREE, THYROIDAB in the last 72 hours. Anemia Panel: No results for input(s): VITAMINB12, FOLATE, FERRITIN, TIBC, IRON, RETICCTPCT in the last 72 hours. Sepsis Labs: No results for input(s): PROCALCITON, LATICACIDVEN in the last 168 hours.  No results found for this or any previous visit (from the past 240 hour(s)).       Radiology Studies: No results found.      Scheduled Meds: . calcium carbonate  1 tablet Oral TID WC  . Chlorhexidine Gluconate Cloth  6 each Topical Daily  . folic acid  1 mg Oral Daily  . heparin  5,000 Units Subcutaneous Q8H  . metoprolol tartrate  12.5 mg Oral BID  . midodrine  10 mg Oral TID WC  . multivitamin with minerals  1 tablet Oral Daily  . pantoprazole (PROTONIX) IV  40 mg Intravenous Q24H  . sodium chloride flush  3 mL Intravenous Q12H  . sodium zirconium cyclosilicate  10 g Oral BID  . thiamine  100 mg Oral Daily   Continuous Infusions: . sodium chloride       LOS: 11 days    Time spent: 25 mins.More than 50% of that time was spent in counseling and/or coordination of care.      Shelly Coss, MD [facility] P6/03/2021, 8:24 AM

## 2021-02-04 NOTE — Progress Notes (Signed)
Physical Therapy Treatment Patient Details Name: [name] MRN: [id number] DOB: 1940/09/18 Today's Date: 02/04/2021    History of Present Illness Pt admitted through ED with c/o LE, abdominal and testicular swelling and dx with CHF exacerbation.  Pt with hx of PVD, demand ischemia, CHF, end stage cardiomegly, etoh abuse, Cirrhosis with recurrent ascites, chronic indwelling catheter, chonice LE wounds (being treated at wound center)    PT Comments    Pt agreeable to mobilizing a little. He c/o feeling tired and a little weak on today.    Follow Up Recommendations  Home health PT     Equipment Recommendations  None recommended by PT    Recommendations for Other Services       Precautions / Restrictions Precautions Precautions: Fall Precaution Comments: blind R eye Restrictions Weight Bearing Restrictions: No    Mobility  Bed Mobility Overal bed mobility: Needs Assistance Bed Mobility: Supine to Sit     Supine to sit: Supervision     General bed mobility comments: HOB elevated, increased time    Transfers Overall transfer level: Needs assistance Equipment used: Rolling walker (2 wheeled) Transfers: Sit to/from Stand Sit to Stand: Supervision            Ambulation/Gait Ambulation/Gait assistance: Min guard Gait Distance (Feet): 60 Feet Assistive device: Rolling walker (2 wheeled) Gait Pattern/deviations: Step-through pattern;Decreased stride length;Trunk flexed     General Gait Details: Min guard for safety. slow gait speed. fatigues fairly easily   Stairs             Wheelchair Mobility    Modified Rankin (Stroke Patients Only)       Balance Overall balance assessment: Needs assistance         Standing balance support: Bilateral upper extremity supported Standing balance-Leahy Scale: Poor                              Cognition Arousal/Alertness: Awake/alert Behavior During Therapy: WFL for tasks  assessed/performed Overall Cognitive Status: Within Functional Limits for tasks assessed                                        Exercises      General Comments        Pertinent Vitals/Pain Pain Assessment: Faces Faces Pain Scale: Hurts a little bit Pain Location: "all over" Pain Descriptors / Indicators: Discomfort;Sore Pain Intervention(s): Limited activity within patient's tolerance;Monitored during session    Home Living                      Prior Function            PT Goals (current goals can now be found in the care plan section) Progress towards PT goals: Progressing toward goals    Frequency    Min 3X/week      PT Plan Current plan remains appropriate    Co-evaluation              AM-PAC PT "6 Clicks" Mobility   Outcome Measure  Help needed turning from your back to your side while in a flat bed without using bedrails?: A Little Help needed moving from lying on your back to sitting on the side of a flat bed without using bedrails?: A Little Help needed moving to and from a bed to a chair (including  a wheelchair)?: A Little Help needed standing up from a chair using your arms (e.g., wheelchair or bedside chair)?: A Little Help needed to walk in hospital room?: A Little Help needed climbing 3-5 steps with a railing? : A Little 6 Click Score: 18    End of Session   Activity Tolerance: Patient tolerated treatment well Patient left: in bed;with call bell/phone within reach (sitting EOB) Nurse Communication:  (pt sitting EOB) PT Visit Diagnosis: Muscle weakness (generalized) (M62.81);Difficulty in walking, not elsewhere classified (R26.2)     Time: 1219-7588 PT Time Calculation (min) (ACUTE ONLY): 24 min  Charges:  $Gait Training: 23-37 mins                         Doreatha Massed, PT Acute Rehabilitation  Office: [phone] Pager: [phone]

## 2021-02-04 NOTE — Plan of Care (Signed)
  Problem: Education: Goal: Knowledge of General Education information will improve Description: Including pain rating scale, medication(s)/side effects and non-pharmacologic comfort measures Outcome: Progressing   Problem: Clinical Measurements: Goal: Respiratory complications will improve Outcome: Progressing   Problem: Education: Goal: Ability to demonstrate management of disease process will improve Outcome: Progressing

## 2021-02-05 ENCOUNTER — Encounter (HOSPITAL_BASED_OUTPATIENT_CLINIC_OR_DEPARTMENT_OTHER): Payer: Medicare Other | Admitting: Internal Medicine

## 2021-02-05 LAB — CBC WITH DIFFERENTIAL/PLATELET
Abs Immature Granulocytes: 0.02 10*3/uL (ref 0.00–0.07)
Basophils Absolute: 0 10*3/uL (ref 0.0–0.1)
Basophils Relative: 1 %
Eosinophils Absolute: 0.1 10*3/uL (ref 0.0–0.5)
Eosinophils Relative: 2 %
HCT: 25.8 % — ABNORMAL LOW (ref 39.0–52.0)
Hemoglobin: 7.8 g/dL — ABNORMAL LOW (ref 13.0–17.0)
Immature Granulocytes: 1 %
Lymphocytes Relative: 7 %
Lymphs Abs: 0.3 10*3/uL — ABNORMAL LOW (ref 0.7–4.0)
MCH: 27.6 pg (ref 26.0–34.0)
MCHC: 30.2 g/dL (ref 30.0–36.0)
MCV: 91.2 fL (ref 80.0–100.0)
Monocytes Absolute: 0.5 10*3/uL (ref 0.1–1.0)
Monocytes Relative: 12 %
Neutro Abs: 3.1 10*3/uL (ref 1.7–7.7)
Neutrophils Relative %: 77 %
Platelets: 353 10*3/uL (ref 150–400)
RBC: 2.83 MIL/uL — ABNORMAL LOW (ref 4.22–5.81)
RDW: 21.1 % — ABNORMAL HIGH (ref 11.5–15.5)
WBC: 4 10*3/uL (ref 4.0–10.5)
nRBC: 0.5 % — ABNORMAL HIGH (ref 0.0–0.2)

## 2021-02-05 LAB — RENAL FUNCTION PANEL
Albumin: 2.8 g/dL — ABNORMAL LOW (ref 3.5–5.0)
Anion gap: 11 (ref 5–15)
BUN: 97 mg/dL — ABNORMAL HIGH (ref 8–23)
CO2: 24 mmol/L (ref 22–32)
Calcium: 8.4 mg/dL — ABNORMAL LOW (ref 8.9–10.3)
Chloride: 99 mmol/L (ref 98–111)
Creatinine, Ser: 4.29 mg/dL — ABNORMAL HIGH (ref 0.61–1.24)
GFR, Estimated: 14 mL/min — ABNORMAL LOW (ref >60)
Glucose, Bld: 92 mg/dL (ref 70–99)
Phosphorus: 5.6 mg/dL — ABNORMAL HIGH (ref 2.5–4.6)
Potassium: 5.6 mmol/L — ABNORMAL HIGH (ref 3.5–5.1)
Sodium: 134 mmol/L — ABNORMAL LOW (ref 135–145)

## 2021-02-05 MED ORDER — PANTOPRAZOLE SODIUM 40 MG PO TBEC
40.0000 mg | DELAYED_RELEASE_TABLET | Freq: Every day | ORAL | Status: DC
  Administered 2021-02-05 – 2021-02-10 (×6): 40 mg via ORAL
  Filled 2021-02-05 (×6): qty 1

## 2021-02-05 MED ORDER — HYDROMORPHONE HCL 1 MG/ML IJ SOLN: 0.5000 mg | INTRAMUSCULAR | Status: DC | PRN

## 2021-02-05 MED ORDER — HYDROMORPHONE HCL 1 MG/ML IJ SOLN
1.0000 mg | INTRAMUSCULAR | Status: DC | PRN
  Administered 2021-02-05: 1 mg via INTRAVENOUS
  Filled 2021-02-05: qty 1

## 2021-02-05 MED ORDER — SODIUM ZIRCONIUM CYCLOSILICATE 10 G PO PACK
10.0000 g | PACK | Freq: Every day | ORAL | Status: DC
  Administered 2021-02-05: 10 g via ORAL
  Filled 2021-02-05: qty 1

## 2021-02-05 NOTE — Progress Notes (Signed)
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  He was found to be severely volume overloaded on presentation and was started on IV Lasix.  Cardiology also consulted and following. Volume overload has significantly improved during this hospitalization, he also underwent paracentesis twice.  He was started on midodrine for hypotension. He was planned for discharge on 02/02/2021 but he developed nausea and vomiting so discharge canceled.  Lab work on 02/03/2021 showed new onset AKI on CKD. Nephrology eval'd and notes he's not a candidate for outpatient dialysis treatment.   Spoke with patient and his sister Education officer, community). Patient is comfort care measures only at this time. Family is working on a space for the patient at his nephew's house in [place]. HCPOA is fully understanding of hospice and is not requesting home health PT/OT for patient at this time.   Assessment & Plan: Volume overload/severe systolic congestive heart failur Acute on chronic systolic heart failure/congestive cardiomyopathy.   Anasarca Liver cirrhosis/hypoalbuminemia     - Echocardiogram showed ejection fraction of 10-20%     - was started on IV diuretics, now held due to AKI.       - Stopped  hydralazine, Imdur because of low blood pressure.      - On low dose metoprolol and midodrine.     - Contnue compression wraps for bilateral lower extremity edema.     - cardiology has evaluated; there are plans follow-up with heart failure clinic.     - 6/9: Patient is comfort care measures at this time; pt/family have discussed the inevitability of his situation and agree that home hospice is the best course of action. Currently working on arrangements to live with his nephew   AKI vs CKD stage IV/hyperkalemia     - Kidney function had previously  improved  with diuresis.     - His baseline creatinine is around 2.5.       - Developed nausea and vomiting on 02/02/2021 and also became hypotensive     - Nephrology was consulted, he is not a candidate for outpt HD; they have recommended hospice     - he was given lokelma for his hyperK+     - his SCr continues to trend up     - can schedule lokelma for now     - 6/9: Patient is comfort care measures at this time; pt/family have discussed the inevitability of his situation and agree that home hospice is the best course of action. Currently working on arrangements to live with his nephew   Nausea/vomiting abdominal discomfort     - Continue Zofran as needed, Protonix.   Ascites History of liver cirrhosis from alcohol.     - s/p paracentesis with removal of 7 L of fluid on admission.       - Has been getting  paracentesis once a month for the last 3 months.     -  Underwent another session of paracentesis on 5/31 with removal of 4.3 L of fluid.     [facility]  gastroenterology arranged  a follow-up appointment as an outpatient so that he can be referred fo  paracentesis to IR/radiology in the future.     - He has an an appointment with Vicie Mutters on 6/16 at 9: 30 am     - 6/9: Patient is comfort care measures at this time; pt/family have discussed the inevitability of his situation and agree that home hospice is the best course of action.  Currently working on arrangements to live with his nephew   Hypotension     - Imdur, hydralazine discontinued.        - continue midodrine   Chest pain/Vtach     - He had left-sided chest pain on 01/31/21,now resolved.     - EKG did not show any acute ischemic changes.     - Has intermittent episodes of nonsustained V. tach.      - 6/9: Patient is comfort care measures at this time; pt/family have discussed the inevitability of his situation and agree that home hospice is the best course of action. Currently working on arrangements to live with his nephew   Left ventricular thrombus     - Not a candidate for anticoagulation due to noncompliance, GI bleed     - 6/9: Patient is comfort care measures at this time; pt/family have discussed the inevitability of his situation and agree that home hospice is the best course of action. Currently working on arrangements to live with his nephew   Normocytic anemia     - Hemoglobin is 7.8.      - Anemia is most likely associated with CKD.       - No evidence of acute blood loss. Iron studies normal.     - follow   Severe BPH/chronic bladder outlet obstruction     - On chronic indwelling Foley catheter.  Changed monthly.   Chronic alcoholism     - Reports quitting drinking 6 months ago     - No signs of withdrawal     - Continue thiamine, folic acid.   Chronic bilateral lower extremity wound     - Follows at wound care center, Dr. Dellia Nims.  Debility/deconditioning     - PT/OT rec HHPT/OT on discharge     - he would like to go home with hospice or palliative services  DVT prophylaxis: none, comfort care measures Code Status: DNR Family Communication: w/ sister by phone   Status is: Inpatient  Remains inpatient appropriate because:Inpatient level of care appropriate due to severity of illness  Dispo: The patient is from: Home              Anticipated d/c is to: Home              Patient currently is medically stable to d/c.   Difficult  to place patient No  Consultants:  Palliative Care Nephrology Cardiology  Procedures:  Paracentesis  Antimicrobials:  None currently   Subjective: C/o rectal and abdominal pain today.  Objective: Vitals:   02/04/21 0846 02/04/21 1308 02/04/21 2018 02/05/21 0500  BP:  109/62 107/64   Pulse: 74 (!) 56 (!) 58   Resp:  20 18   Temp:  (!) 97.5 F (36.4 C) (!) 97.5 F (36.4 C)   TempSrc:  Oral Oral   SpO2:  100% 100%   Weight:    85.7 kg  Intake/Output Summary (Last 24 hours) at 02/05/2021 0736 Last data filed at 02/04/2021 1800 Gross per 24 hour  Intake 240 ml  Output 300 ml  Net -60 ml   Filed Weights   02/03/21 0606 02/04/21 0500 02/05/21 0500  Weight: 85.7 kg 87 kg 85.7 kg    Examination:  General: 80 y.o. male resting in bed in NAD Eyes: PERRL, normal sclera ENMT: Nares patent w/o discharge, orophaynx clear, dentition normal, ears w/o discharge/lesions/ulcers Neck: Supple, trachea midline Cardiovascular: RRR, +S1, S2, no m/g/r, equal pulses throughout Respiratory: CTABL, no w/r/r, normal WOB GI: BS+, mild distention, NT, no masses noted, no organomegaly noted MSK: No e/c/c; BLE dressings CDI Skin: No rashes, bruises, ulcerations noted Neuro: A&O x 3, no focal deficits Psyc: Appropriate interaction and affect, calm/cooperative   Data Reviewed: I have personally reviewed following labs and imaging studies.  CBC: Recent Labs  Lab 01/30/21 0436 02/01/21 1113 02/03/21 0442 02/05/21 0507  WBC 4.9 5.5 4.9 4.0  NEUTROABS 3.8 4.5 3.7 3.1  HGB 7.4* 7.7* 7.7* 7.8*  HCT 24.8* 26.2* 26.0* 25.8*  MCV 89.2 90.3 91.2 91.2  PLT 288 291 329 182   Basic Metabolic Panel: Recent Labs  Lab 01/30/21 0430 01/30/21 0436 02/01/21 1113 02/03/21 0442 02/04/21 0520 02/05/21 0507  NA  --  136 136 133* 134* 134*  K  --  4.3 4.8 5.0 5.9* 5.6*  CL  --  100 98 95* 99 99  CO2  --  30 30 26 24 24   GLUCOSE  --  109* 115* 109* 86 92  BUN  --  63* 75* 89* 90* 97*   CREATININE  --  2.42* 2.66* 3.43* 3.98* 4.29*  CALCIUM  --  8.2* 8.6* 8.4* 8.4* 8.4*  MG 1.9  --  2.4  --   --   --   PHOS  --   --   --   --  5.3* 5.6*   GFR: Estimated Creatinine Clearance: 15.4 mL/min (A) (by C-G formula based on SCr of 4.29 mg/dL (H)). Liver Function Tests: Recent Labs  Lab 02/03/21 1156 02/04/21 0520 02/05/21 0507  ALBUMIN 2.5* 3.1* 2.8*   No results for input(s): LIPASE, AMYLASE in the last 168 hours. No results for input(s): AMMONIA in the last 168 hours. Coagulation Profile: No results for input(s): INR, PROTIME in the last 168 hours. Cardiac Enzymes: No results for input(s): CKTOTAL, CKMB, CKMBINDEX, TROPONINI in the last 168 hours. BNP (last 3 results) No results for input(s): PROBNP in the last 8760 hours. HbA1C: No results for input(s): HGBA1C in the last 72 hours. CBG: No results for input(s): GLUCAP in the last 168 hours. Lipid Profile: No results for input(s): CHOL, HDL, LDLCALC, TRIG, CHOLHDL, LDLDIRECT in the last 72 hours. Thyroid Function Tests: No results for input(s): TSH, T4TOTAL, FREET4, T3FREE, THYROIDAB in the last 72 hours. Anemia Panel: No results for input(s): VITAMINB12, FOLATE, FERRITIN, TIBC, IRON, RETICCTPCT in the last 72 hours. Sepsis Labs: No results for input(s): PROCALCITON, LATICACIDVEN in the last 168 hours.  No results found for this or any previous visit (from the past 240 hour(s)).    Radiology Studies: No results found.   Scheduled Meds:  calcium carbonate  1 tablet Oral TID WC   Chlorhexidine Gluconate Cloth  6 each Topical Daily   folic acid  1 mg Oral Daily   heparin  5,000 Units Subcutaneous Q8H   metoprolol tartrate  12.5 mg Oral BID   midodrine  10 mg Oral TID  WC   multivitamin with minerals  1 tablet Oral Daily   pantoprazole (PROTONIX) IV  40 mg Intravenous Q24H   sodium chloride flush  3 mL Intravenous Q12H   thiamine  100 mg Oral Daily   Continuous Infusions:  sodium chloride       LOS:  12 days    Time spent: 40 minutes   Jonnie Finner, DO [facility]  If 7PM-7AM, please contact night-coverage [email or web address] 02/05/2021, 7:36 AM

## 2021-02-05 NOTE — TOC Progression Note (Signed)
Transition of Care [facility]) - Progression Note    Patient Details  Name: [name] MRN: [id number] Date of Birth: Jan 01, 1941  [facility] CM/SW Contact  Ross Ludwig, [place] Phone Number: 02/05/2021, 4:14 PM  Clinical Narrative:     CSW was informed that patient was interested in hospice at home per palliative team.  CSW contacted patient to discuss his decision.  CSW explained to patient the difference between palliative and hospice at home.  CSW told him that if he chooses to go home with hospice he will not be able to receive [facility] PT and OT.   CSW asked patient if he felt like he was benefiting from home health PT still, per patient he states yes he is.  CSW asked if he would like to continue with it, and he stated he would.  Patient is currently being followed by palliative through [place] and home health through [place].  Patient expressed he does not need any other equipment.  CSW to continue to follow patient's progress throughout discharge planning.   Expected Discharge Plan: [place] Barriers to Discharge: Barriers Resolved  Expected Discharge Plan and Services Expected Discharge Plan: [place]   Discharge Planning Services: CM Consult   Living arrangements for the past 2 months: Single Family Home Expected Discharge Date: 02/02/21                         HH Arranged: PT, RN [place] Agency: [place] Date [place]: 02/02/21 Time [place]: 1053 Representative spoke with at [place]: [place] Determinants of Health ([place]) Interventions    Readmission Risk Interventions No flowsheet data found.

## 2021-02-05 NOTE — Plan of Care (Signed)
  Problem: Education: Goal: Knowledge of General Education information will improve Description: Including pain rating scale, medication(s)/side effects and non-pharmacologic comfort measures Outcome: Progressing   Problem: Clinical Measurements: Goal: Respiratory complications will improve Outcome: Progressing   Problem: Nutrition: Goal: Adequate nutrition will be maintained Outcome: Progressing   Problem: Coping: Goal: Level of anxiety will decrease Outcome: Progressing   Problem: Education: Goal: Ability to demonstrate management of disease process will improve Outcome: Progressing

## 2021-02-05 NOTE — Consult Note (Signed)
[place] Nurse wound follow up Patient receiving care in [facility] Wound type: NO wounds on BLE, they have all healed Measurement: Wound bed: Drainage (amount, consistency, odor)  Periwound: Dressing procedure/placement/frequency: BLE 3 layer compression wraps removed, BLE washed with soap and water, moisturizing ointment applied, then 3 layer Profore Lite applied.  Patient tolerated well. Val Riles, RN, MSN, CWOCN, CNS-BC, pager [phone]

## 2021-02-05 NOTE — Progress Notes (Signed)
Daily Progress Note   Patient Name: [name]       Date: 02/05/2021 DOB: [birth year]  Age: 80 y.o. MRN#: [id number] Attending Physician: Jonnie Finner, DO Primary Care Physician: [name], MD Admit Date: 01/23/2021  Reason for Consultation/Follow-up: Establishing goals of care  Subjective: Patient is resting in bed, complains of rectal discomfort after bowel movement, he also has supra pubic discomfort. I re discussed goals of care and disposition options with the patient, see below.   Length of Stay: 12  Current Medications: Scheduled Meds:   calcium carbonate  1 tablet Oral TID WC   Chlorhexidine Gluconate Cloth  6 each Topical Daily   folic acid  1 mg Oral Daily   heparin  5,000 Units Subcutaneous Q8H   metoprolol tartrate  12.5 mg Oral BID   midodrine  10 mg Oral TID WC   multivitamin with minerals  1 tablet Oral Daily   pantoprazole  40 mg Oral Daily   sodium chloride flush  3 mL Intravenous Q12H   sodium zirconium cyclosilicate  10 g Oral Daily   thiamine  100 mg Oral Daily    Continuous Infusions:  sodium chloride      PRN Meds: sodium chloride, acetaminophen, HYDROmorphone (DILAUDID) injection, ondansetron (ZOFRAN) IV, oxyCODONE, sodium chloride flush  Physical Exam         Patient is resting in bed Decreased peripheral edema Mild distress Regular work of breathing S1-S2 Bilateral lower extremities with dressing and edema  Vital Signs: BP 116/74   Pulse (!) 56   Temp (!) 97.5 F (36.4 C) (Oral)   Resp 18   Wt 85.7 kg   SpO2 100%   BMI 26.35 kg/m  SpO2: SpO2: 100 % O2 Device: O2 Device: Room Air O2 Flow Rate:    Intake/output summary:   Intake/Output Summary (Last 24 hours) at 02/05/2021 1038 Last data filed at 02/04/2021 1800 Gross per  24 hour  Intake 120 ml  Output 300 ml  Net -180 ml    LBM: Last BM Date: 02/05/21 Baseline Weight: Weight: 91.9 kg Most recent weight: Weight: 85.7 kg       Palliative Assessment/Data: Palliative performance scale 40%.     Patient Active Problem List   Diagnosis Date Noted   Palliative care by specialist  Goals of care, counseling/discussion    General weakness    Pressure injury of skin 01/29/2021   CHF exacerbation ([place]) 01/24/2021   Volume overload 10/12/2020   GERD without esophagitis 10/11/2020   BPH with obstruction/lower urinary tract symptoms 10/11/2020   Cirrhosis of liver with ascites ([place]) 67/61/9509   Chronic systolic CHF (congestive heart failure) (HCC)    Symptomatic anemia    Multiple gastric ulcers    CKD (chronic kidney disease), stage IV ([place]) 05/15/2020   Chest pain 05/15/2020   Palliative care encounter    Acute on chronic congestive heart failure ([place]) 02/04/2019   Anemia of chronic disease 03/08/2018   Acute cholecystitis 11/23/2017   Renal lesion 11/23/2017   Alcohol abuse 02/26/2015   Mixed hyperlipidemia    Diabetes mellitus without complication (HCC)    Acute on chronic combined systolic and diastolic CHF (congestive heart failure) ([place])    Essential hypertension     Palliative Care Assessment & Plan   Patient Profile:    Assessment: Chronic systolic congestive heart failure, end-stage cardiomyopathy, ejection fraction less than 20% Cirrhosis with recurrent ascites History of chronic alcohol use Hypertension dyslipidemia diabetes peripheral vascular disease stage IV chronic kidney disease chronic anemia Severe BPH with chronic indwelling Foley catheter Chronic lower extremity wounds/edema   Recommendations/Plan: DNR Goals of care and broad disposition options discussed again with patient. Patient doesn't want [employer], wants home with hospice.    Code Status:    Code Status Orders  (From admission, onward)            Start     Ordered   01/25/21 1032  Do not attempt resuscitation (DNR)  Continuous       Question Answer Comment  In the event of cardiac or respiratory ARREST Do not call a "code blue"   In the event of cardiac or respiratory ARREST Do not perform Intubation, CPR, defibrillation or ACLS   In the event of cardiac or respiratory ARREST Use medication by any route, position, wound care, and other measures to relive pain and suffering. May use oxygen, suction and manual treatment of airway obstruction as needed for comfort.      01/25/21 1031           Code Status History     Date Active Date Inactive Code Status Order ID Comments User Context   01/24/2021 0156 01/25/2021 1031 Full Code [id number]  Shela Leff, MD ED   10/11/2020 2023 10/15/2020 2106 Full Code [id number]  Vernelle Emerald, MD ED   05/15/2020 1332 05/22/2020 1946 Full Code [id number]  Harold Hedge, MD ED   06/23/2019 1236 07/09/2019 1623 DNR [id number]  Edwin Dada, MD Inpatient   06/19/2019 0141 06/23/2019 1236 Full Code [id number]  Jani Gravel, MD ED   02/04/2019 1216 02/12/2019 1720 Full Code [id number]  Kathi Ludwig, MD ED   11/23/2017 2118 11/26/2017 2039 Full Code [id number]  Vianne Bulls, MD ED   02/26/2015 0646 02/27/2015 1859 Full Code [id number]  Ivor Costa, MD Inpatient   Advance Care Planning Activity       Prognosis:  ?4 weeks  Discharge Planning: Home with hospice    Care plan was discussed with patient and IDT.    Thank you for allowing the Palliative Medicine Team to assist in the care of this patient.   Time In: 10 Time Out: 10.35 Total Time 35 Prolonged Time Billed  no       Greater than 50%  of this  time was spent counseling and coordinating care related to the above assessment and plan.  Loistine Chance, MD  Please contact Palliative Medicine Team phone at [phone] for questions and concerns.

## 2021-02-05 NOTE — Progress Notes (Signed)
Nutrition Brief Note  Patient with LOS of 12 days and noted to have a stage 2 pressure injury to sacrum. Chart reviewed. Palliative Care saw patient yesterday and today. PPS of 40% and prognosis of possibly 4 weeks documented.   MD note from today states that patient is now comfort care measures only with plan to d/c to a family member's home with hospice.    Patient currently ordered a Heart Healthy diet; will change to Regular for patient who is comfort care. No further nutrition interventions planned at this time. Please consult as needed.      Jarome Matin, MS, RD, LDN, CNSC Inpatient Clinical Dietitian RD pager # available in [place]  After hours/weekend pager # available in [facility]

## 2021-02-06 ENCOUNTER — Encounter (HOSPITAL_COMMUNITY): Payer: Medicare Other

## 2021-02-06 DIAGNOSIS — F1010 Alcohol abuse, uncomplicated: Secondary | ICD-10-CM

## 2021-02-06 LAB — URINALYSIS, ROUTINE W REFLEX MICROSCOPIC
Bilirubin Urine: NEGATIVE
Glucose, UA: NEGATIVE mg/dL
Ketones, ur: NEGATIVE mg/dL
Nitrite: NEGATIVE
Protein, ur: 100 mg/dL — AB
Specific Gravity, Urine: 1.016 (ref 1.005–1.030)
WBC, UA: >50 WBC/hpf — ABNORMAL HIGH (ref 0–5)
pH: 5 (ref 5.0–8.0)

## 2021-02-06 NOTE — Progress Notes (Signed)
Daily Progress Note   Patient Name: [name]       Date: 02/06/2021 DOB: 21-Oct-1940  Age: 80 y.o. MRN#: [id number] Attending Physician: Bonnell Public, MD Primary Care Physician: [name], MD Admit Date: 01/23/2021  Reason for Consultation/Follow-up: Establishing goals of care  Subjective: Patient is resting in bed, complains of pain in abdomen, asking about his discharge plans.     Length of Stay: 13  Current Medications: Scheduled Meds:   calcium carbonate  1 tablet Oral TID WC   Chlorhexidine Gluconate Cloth  6 each Topical Daily   folic acid  1 mg Oral Daily   metoprolol tartrate  12.5 mg Oral BID   midodrine  10 mg Oral TID WC   multivitamin with minerals  1 tablet Oral Daily   pantoprazole  40 mg Oral Daily   sodium chloride flush  3 mL Intravenous Q12H   thiamine  100 mg Oral Daily    Continuous Infusions:  sodium chloride      PRN Meds: sodium chloride, acetaminophen, HYDROmorphone (DILAUDID) injection, ondansetron (ZOFRAN) IV, oxyCODONE, sodium chloride flush  Physical Exam         Patient is resting in bed Decreased peripheral edema Mild distress Regular work of breathing S1-S2 Bilateral lower extremities with dressing and edema  Vital Signs: BP 110/68 (BP Location: Right Arm)   Pulse 76   Temp (!) 97.5 F (36.4 C) (Oral)   Resp 18   Wt 86.4 kg   SpO2 95%   BMI 26.57 kg/m  SpO2: SpO2: 95 % O2 Device: O2 Device: Room Air O2 Flow Rate:    Intake/output summary:   Intake/Output Summary (Last 24 hours) at 02/06/2021 1539 Last data filed at 02/06/2021 0900 Gross per 24 hour  Intake 243 ml  Output 875 ml  Net -632 ml    LBM: Last BM Date: 02/05/21 Baseline Weight: Weight: 91.9 kg Most recent weight: Weight: 86.4 kg        Palliative Assessment/Data: Palliative performance scale 40%.     Patient Active Problem List   Diagnosis Date Noted   Palliative care by specialist    Goals of care, counseling/discussion    General weakness    Pressure injury of skin 01/29/2021   CHF exacerbation ([place]) 01/24/2021   Volume overload 10/12/2020  GERD without esophagitis 10/11/2020   BPH with obstruction/lower urinary tract symptoms 10/11/2020   Cirrhosis of liver with ascites ([place]) 14/48/1856   Chronic systolic CHF (congestive heart failure) (HCC)    Symptomatic anemia    Multiple gastric ulcers    CKD (chronic kidney disease), stage IV ([place]) 05/15/2020   Chest pain 05/15/2020   Palliative care encounter    Acute on chronic congestive heart failure ([place]) 02/04/2019   Anemia of chronic disease 03/08/2018   Acute cholecystitis 11/23/2017   Renal lesion 11/23/2017   Alcohol abuse 02/26/2015   Mixed hyperlipidemia    Diabetes mellitus without complication (HCC)    Acute on chronic combined systolic and diastolic CHF (congestive heart failure) ([place])    Essential hypertension     Palliative Care Assessment & Plan   Patient Profile:    Assessment: Chronic systolic congestive heart failure, end-stage cardiomyopathy, ejection fraction less than 20% Cirrhosis with recurrent ascites History of chronic alcohol use Hypertension dyslipidemia diabetes peripheral vascular disease stage IV chronic kidney disease chronic anemia Severe BPH with chronic indwelling Foley catheter Chronic lower extremity wounds/edema   Recommendations/Plan: DNR Home with home health and continuation of home based palliative care through [facility] as per patient preference.   Code Status:    Code Status Orders  (From admission, onward)           Start     Ordered   01/25/21 1032  Do not attempt resuscitation (DNR)  Continuous       Question Answer Comment  In the event of cardiac or respiratory ARREST Do not call a "code  blue"   In the event of cardiac or respiratory ARREST Do not perform Intubation, CPR, defibrillation or ACLS   In the event of cardiac or respiratory ARREST Use medication by any route, position, wound care, and other measures to relive pain and suffering. May use oxygen, suction and manual treatment of airway obstruction as needed for comfort.      01/25/21 1031           Code Status History     Date Active Date Inactive Code Status Order ID Comments User Context   01/24/2021 0156 01/25/2021 1031 Full Code [id number]  Shela Leff, MD ED   10/11/2020 2023 10/15/2020 2106 Full Code [id number]  Vernelle Emerald, MD ED   05/15/2020 1332 05/22/2020 1946 Full Code [id number]  Harold Hedge, MD ED   06/23/2019 1236 07/09/2019 1623 DNR [id number]  Edwin Dada, MD Inpatient   06/19/2019 0141 06/23/2019 1236 Full Code [id number]  Jani Gravel, MD ED   02/04/2019 1216 02/12/2019 1720 Full Code [id number]  Kathi Ludwig, MD ED   11/23/2017 2118 11/26/2017 2039 Full Code [id number]  Vianne Bulls, MD ED   02/26/2015 0646 02/27/2015 1859 Full Code [id number]  Ivor Costa, MD Inpatient   Advance Care Planning Activity       Prognosis:  ? Less than 12 months.   Discharge Planning: Home with palliative.   Care plan was discussed with patient and IDT.    Thank you for allowing the Palliative Medicine Team to assist in the care of this patient.   Time In: 1500 Time Out: 1525 Total Time 25 Prolonged Time Billed  no       Greater than 50%  of this time was spent counseling and coordinating care related to the above assessment and plan.  Loistine Chance, MD  Please contact Palliative Medicine Team phone at [phone] for questions and concerns.

## 2021-02-06 NOTE — Progress Notes (Signed)
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  He was found to be severely volume overloaded on presentation and was started on IV Lasix.  Cardiology also consulted and following. Volume overload has significantly improved during this hospitalization, he also underwent paracentesis twice.  He was started on midodrine for hypotension. He was planned for discharge on 02/02/2021 but he developed nausea and vomiting so discharge canceled.  Lab work on 02/03/2021 showed new onset AKI on CKD. Nephrology eval'd and notes he's not a candidate for outpatient dialysis treatment.   Spoke with patient and his sister Education officer, community). Patient is comfort care measures only at this time. Family is working on a space for the patient at his nephew's house in [place]. HCPOA is fully understanding of hospice and is not requesting home health PT/OT for patient at this time.  02/06/2021: Patient seen.  Patient remains stable.  Reports penile swelling and rectal pain.   Assessment & Plan: Volume overload/severe systolic congestive heart failur Acute on chronic systolic heart failure/congestive cardiomyopathy.   Anasarca Liver cirrhosis/hypoalbuminemia     - Echocardiogram showed ejection fraction  of 10-20%     - was started on IV diuretics, now held due to AKI.       - Stopped hydralazine, Imdur because of low blood pressure.      - On low dose metoprolol and midodrine.     - Contnue compression wraps for bilateral lower extremity edema.     - cardiology has evaluated; there are plans follow-up with heart failure clinic.     - 6/9: Patient is comfort care measures at this time; pt/family have discussed the inevitability of his situation and agree that home hospice is the best course of action. Currently working on arrangements to live with his nephew   AKI vs CKD stage IV/hyperkalemia     - Kidney function had previously  improved  with diuresis.     - His baseline creatinine is around 2.5.       - Developed nausea and vomiting on 02/02/2021 and also became hypotensive     - Nephrology was consulted, he is not a candidate for outpt HD; they have recommended hospice     - he was given lokelma for his hyperK+     - his SCr continues to trend up     - can schedule lokelma for now     - 6/9: Patient is comfort care measures at this time; pt/family have discussed the inevitability of his situation and agree that home hospice is the best course of action. Currently working on arrangements to live with his nephew   Nausea/vomiting abdominal discomfort     - Continue Zofran as needed, Protonix.   Ascites History of liver cirrhosis from alcohol.     - s/p paracentesis with removal of 7 L of fluid on admission.       -  Has been getting  paracentesis once a month for the last 3 months.     - Underwent another session of paracentesis on 5/31 with removal of 4.3 L of fluid.     [facility]ology arranged  a follow-up appointment as an outpatient so that he can be referred fo  paracentesis to IR/radiology in the future.     - He has an an appointment with Vicie Mutters on 6/16 at 9: 30 am     - 6/9: Patient is comfort care measures at this time; pt/family have discussed the  inevitability of his situation and agree that home hospice is the best course of action. Currently working on arrangements to live with his nephew   Hypotension     - Imdur, hydralazine discontinued.        - continue midodrine   Chest pain/Vtach     - He had left-sided chest pain on 01/31/21,now resolved.     - EKG did not show any acute ischemic changes.     - Has intermittent episodes of nonsustained V. tach.      - 6/9: Patient is comfort care measures at this time; pt/family have discussed the inevitability of his situation and agree that home hospice is the best course of action. Currently working on arrangements to live with his nephew   Left ventricular thrombus     - Not a candidate for anticoagulation due to noncompliance, GI bleed     - 6/9: Patient is comfort care measures at this time; pt/family have discussed the inevitability of his situation and agree that home hospice is the best course of action. Currently working on arrangements to live with his nephew   Normocytic anemia     - Hemoglobin is 7.8.      - Anemia is most likely associated with CKD.       - No evidence of acute blood loss. Iron studies normal.     - follow   Severe BPH/chronic bladder outlet obstruction     - On chronic indwelling Foley catheter.  Changed monthly.   Chronic alcoholism     - Reports quitting drinking 6 months ago     - No signs of withdrawal     - Continue thiamine, folic acid.   Chronic bilateral lower extremity wound     - Follows at wound care center, Dr. Dellia Nims.  Debility/deconditioning     - PT/OT rec HHPT/OT on discharge     - he would like to go home with hospice or palliative services  DVT prophylaxis: none, comfort care measures Code Status: DNR Family Communication: w/ sister by phone   Status is: Inpatient  Remains inpatient appropriate because:Inpatient level of care appropriate due to severity of illness  Dispo: The patient is from: Home              Anticipated  d/c is to: Home              Patient currently is medically stable to d/c.   Difficult to place patient No  Consultants:  Palliative Care Nephrology Cardiology  Procedures:  Paracentesis  Antimicrobials:  None currently   Subjective: C/o rectal pain and penile swelling.  Objective: Vitals:   02/05/21 2109 02/06/21 0500 02/06/21 0537 02/06/21 1439  BP: 116/66  (!) 98/50 110/68  Pulse: 60  (!) 55 76  Resp: 18  20 18   Temp: 97.7 F (36.5 C)  (!) 97.4 F (36.3 C) (!) 97.5 F (  36.4 C)  TempSrc:   Oral Oral  SpO2: 91%  (!) 85% 95%  Weight:  86.4 kg      Intake/Output Summary (Last 24 hours) at 02/06/2021 1640 Last data filed at 02/06/2021 0900 Gross per 24 hour  Intake 243 ml  Output 875 ml  Net -632 ml    Filed Weights   02/04/21 0500 02/05/21 0500 02/06/21 0500  Weight: 87 kg 85.7 kg 86.4 kg    Examination:  General: 80 y.o. male resting in bed in NAD Eyes: PERRL, normal sclera ENMT: Nares patent w/o discharge, orophaynx clear, dentition normal, ears w/o discharge/lesions/ulcers Neck: Supple, trachea midline Cardiovascular: RRR, +S1, S2, no m/g/r, equal pulses throughout Respiratory: CTABL, no w/r/r, normal WOB GI: BS+, mild distention, NT, no masses noted, no organomegaly noted MSK: No e/c/c; BLE dressings CDI Skin: No rashes, bruises, ulcerations noted Neuro: A&O x 3, no focal deficits Psyc: Appropriate interaction and affect, calm/cooperative   Data Reviewed: I have personally reviewed following labs and imaging studies.  CBC: Recent Labs  Lab 02/01/21 1113 02/03/21 0442 02/05/21 0507  WBC 5.5 4.9 4.0  NEUTROABS 4.5 3.7 3.1  HGB 7.7* 7.7* 7.8*  HCT 26.2* 26.0* 25.8*  MCV 90.3 91.2 91.2  PLT 291 329 782    Basic Metabolic Panel: Recent Labs  Lab 02/01/21 1113 02/03/21 0442 02/04/21 0520 02/05/21 0507  NA 136 133* 134* 134*  K 4.8 5.0 5.9* 5.6*  CL 98 95* 99 99  CO2 30 26 24 24   GLUCOSE 115* 109* 86 92  BUN 75* 89* 90* 97*   CREATININE 2.66* 3.43* 3.98* 4.29*  CALCIUM 8.6* 8.4* 8.4* 8.4*  MG 2.4  --   --   --   PHOS  --   --  5.3* 5.6*    GFR: Estimated Creatinine Clearance: 15.4 mL/min (A) (by C-G formula based on SCr of 4.29 mg/dL (H)). Liver Function Tests: Recent Labs  Lab 02/03/21 1156 02/04/21 0520 02/05/21 0507  ALBUMIN 2.5* 3.1* 2.8*    No results for input(s): LIPASE, AMYLASE in the last 168 hours. No results for input(s): AMMONIA in the last 168 hours. Coagulation Profile: No results for input(s): INR, PROTIME in the last 168 hours. Cardiac Enzymes: No results for input(s): CKTOTAL, CKMB, CKMBINDEX, TROPONINI in the last 168 hours. BNP (last 3 results) No results for input(s): PROBNP in the last 8760 hours. HbA1C: No results for input(s): HGBA1C in the last 72 hours. CBG: No results for input(s): GLUCAP in the last 168 hours. Lipid Profile: No results for input(s): CHOL, HDL, LDLCALC, TRIG, CHOLHDL, LDLDIRECT in the last 72 hours. Thyroid Function Tests: No results for input(s): TSH, T4TOTAL, FREET4, T3FREE, THYROIDAB in the last 72 hours. Anemia Panel: No results for input(s): VITAMINB12, FOLATE, FERRITIN, TIBC, IRON, RETICCTPCT in the last 72 hours. Sepsis Labs: No results for input(s): PROCALCITON, LATICACIDVEN in the last 168 hours.  No results found for this or any previous visit (from the past 240 hour(s)).    Radiology Studies: No results found.   Scheduled Meds:  calcium carbonate  1 tablet Oral TID WC   Chlorhexidine Gluconate Cloth  6 each Topical Daily   folic acid  1 mg Oral Daily   metoprolol tartrate  12.5 mg Oral BID   midodrine  10 mg Oral TID WC   multivitamin with minerals  1 tablet Oral Daily   pantoprazole  40 mg Oral Daily   sodium chloride flush  3 mL Intravenous Q12H   thiamine  100 mg Oral Daily   Continuous Infusions:  sodium chloride       LOS: 13 days    Time spent: 25 minutes   Bonnell Public, MD [facility]  If  7PM-7AM, please contact night-coverage [email or web address] 02/06/2021, 4:40 PM

## 2021-02-07 DIAGNOSIS — E119 Type 2 diabetes mellitus without complications: Secondary | ICD-10-CM

## 2021-02-07 DIAGNOSIS — I10 Essential (primary) hypertension: Secondary | ICD-10-CM

## 2021-02-07 NOTE — Plan of Care (Signed)
  Problem: Safety: Goal: Ability to remain free from injury will improve Outcome: Progressing   

## 2021-02-07 NOTE — Progress Notes (Signed)
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  He was found to be severely volume overloaded on presentation and was started on IV Lasix.  Cardiology also consulted and following. Volume overload has significantly improved during this hospitalization, he also underwent paracentesis twice.  He was started on midodrine for hypotension. He was planned for discharge on 02/02/2021 but he developed nausea and vomiting so discharge canceled.  Lab work on 02/03/2021 showed new onset AKI on CKD. Nephrology eval'd and notes he's not a candidate for outpatient dialysis treatment.   Spoke with patient and his sister Education officer, community). Patient is comfort care measures only at this time. Family is working on a space for the patient at his nephew's house in [place]. HCPOA is fully understanding of hospice and is not requesting home health PT/OT for patient at this time.  02/06/2021: Patient seen.  Patient remains stable.  Reports penile swelling and rectal pain. 02/07/2021: Patient seen.  No new changes.  Palliative care input is appreciated.  Recommendation as per palliative care team is to continue home-based palliative care/home health.  We will consult transition of care team.  Assessment &  Plan: Volume overload/severe systolic congestive heart failur Acute on chronic systolic heart failure/congestive cardiomyopathy.   Anasarca Liver cirrhosis/hypoalbuminemia     - Echocardiogram showed ejection fraction of 10-20%     - was started on IV diuretics, now held due to AKI.       - Stopped hydralazine, Imdur because of low blood pressure.      - On low dose metoprolol and midodrine.     - Contnue compression wraps for bilateral lower extremity edema.     - cardiology has evaluated; there are plans follow-up with heart failure clinic.     - 6/9: Patient is comfort care measures at this time; pt/family have discussed the inevitability of his situation and agree that home hospice is the best course of action. Currently working on arrangements to live with his nephew   AKI vs CKD stage IV/hyperkalemia     - Kidney function had previously  improved  with diuresis.     - His baseline creatinine is around 2.5.       - Developed nausea and vomiting on 02/02/2021 and also became hypotensive     - Nephrology was consulted, he is not a candidate for outpt HD; they have recommended hospice     - he was given lokelma for his hyperK+     - his SCr continues to trend up     - can schedule lokelma for now     - 6/9: Patient is comfort care measures at this time; pt/family have discussed the inevitability of his situation and agree that home hospice is the best course of action. Currently working on arrangements to live with his nephew   Nausea/vomiting abdominal discomfort     - Continue  Zofran as needed, Protonix.   Ascites History of liver cirrhosis from alcohol.     - s/p paracentesis with removal of 7 L of fluid on admission.       - Has been getting  paracentesis once a month for the last 3 months.     - Underwent another session of paracentesis on 5/31 with removal of 4.3 L of fluid.     [facility]  gastroenterology arranged  a follow-up appointment as an outpatient so that he can be  referred fo  paracentesis to IR/radiology in the future.     - He has an an appointment with Vicie Mutters on 6/16 at 9: 30 am     - 6/9: Patient is comfort care measures at this time; pt/family have discussed the inevitability of his situation and agree that home hospice is the best course of action. Currently working on arrangements to live with his nephew   Hypotension     - Imdur, hydralazine discontinued.        - continue midodrine   Chest pain/Vtach     - He had left-sided chest pain on 01/31/21,now resolved.     - EKG did not show any acute ischemic changes.     - Has intermittent episodes of nonsustained V. tach.      - 6/9: Patient is comfort care measures at this time; pt/family have discussed the inevitability of his situation and agree that home hospice is the best course of action. Currently working on arrangements to live with his nephew   Left ventricular thrombus     - Not a candidate for anticoagulation due to noncompliance, GI bleed     - 6/9: Patient is comfort care measures at this time; pt/family have discussed the inevitability of his situation and agree that home hospice is the best course of action. Currently working on arrangements to live with his nephew   Normocytic anemia     - Hemoglobin is 7.8.      - Anemia is most likely associated with CKD.       - No evidence of acute blood loss. Iron studies normal.     - follow   Severe BPH/chronic bladder outlet obstruction     - On chronic indwelling Foley catheter.  Changed monthly.   Chronic alcoholism     - Reports quitting drinking 6 months ago     - No signs of withdrawal     - Continue thiamine, folic acid.   Chronic bilateral lower extremity wound     - Follows at wound care center, Dr. Dellia Nims.  Debility/deconditioning     - PT/OT rec HHPT/OT on discharge     - he would like to go home with hospice or palliative services  DVT prophylaxis: none, comfort care measures Code Status: DNR Family  Communication: w/ sister by phone   Status is: Inpatient  Remains inpatient appropriate because:Inpatient level of care appropriate due to severity of illness  Dispo: The patient is from: Home              Anticipated d/c is to: Home              Patient currently is medically stable to d/c.   Difficult to place patient No  Consultants:  Palliative Care Nephrology Cardiology  Procedures:  Paracentesis  Antimicrobials:  None currently   Subjective: No new complaints today.    Objective: Vitals:   02/06/21 2247 02/07/21 0455 02/07/21 0500 02/07/21  1314  BP: 101/61 109/62  119/68  Pulse: (!) 58 (!) 57  73  Resp: 20 20  18   Temp: 97.6 F (36.4 C) 97.8 F (36.6 C)  (!) 97.4 F (36.3 C)  TempSrc:  Oral    SpO2: 94% 100%  97%  Weight:   85.4 kg     Intake/Output Summary (Last 24 hours) at 02/07/2021 1715 Last data filed at 02/07/2021 1300 Gross per 24 hour  Intake 480 ml  Output --  Net 480 ml    Filed Weights   02/05/21 0500 02/06/21 0500 02/07/21 0500  Weight: 85.7 kg 86.4 kg 85.4 kg    Examination:  General: 80 y.o. male resting in bed in NAD Eyes: PERRL, normal sclera ENMT: Nares patent w/o discharge, orophaynx clear, dentition normal, ears w/o discharge/lesions/ulcers Neck: Supple, trachea midline Cardiovascular: RRR, +S1, S2, no m/g/r, equal pulses throughout Respiratory: CTABL, no w/r/r, normal WOB GI: BS+, mild distention, NT, no masses noted, no organomegaly noted MSK: No e/c/c; BLE dressings CDI Skin: No rashes, bruises, ulcerations noted Neuro: A&O x 3, no focal deficits Psyc: Appropriate interaction and affect, calm/cooperative   Data Reviewed: I have personally reviewed following labs and imaging studies.  CBC: Recent Labs  Lab 02/01/21 1113 02/03/21 0442 02/05/21 0507  WBC 5.5 4.9 4.0  NEUTROABS 4.5 3.7 3.1  HGB 7.7* 7.7* 7.8*  HCT 26.2* 26.0* 25.8*  MCV 90.3 91.2 91.2  PLT 291 329 283    Basic Metabolic Panel: Recent Labs  Lab  02/01/21 1113 02/03/21 0442 02/04/21 0520 02/05/21 0507  NA 136 133* 134* 134*  K 4.8 5.0 5.9* 5.6*  CL 98 95* 99 99  CO2 30 26 24 24   GLUCOSE 115* 109* 86 92  BUN 75* 89* 90* 97*  CREATININE 2.66* 3.43* 3.98* 4.29*  CALCIUM 8.6* 8.4* 8.4* 8.4*  MG 2.4  --   --   --   PHOS  --   --  5.3* 5.6*    GFR: Estimated Creatinine Clearance: 15.4 mL/min (A) (by C-G formula based on SCr of 4.29 mg/dL (H)). Liver Function Tests: Recent Labs  Lab 02/03/21 1156 02/04/21 0520 02/05/21 0507  ALBUMIN 2.5* 3.1* 2.8*    No results for input(s): LIPASE, AMYLASE in the last 168 hours. No results for input(s): AMMONIA in the last 168 hours. Coagulation Profile: No results for input(s): INR, PROTIME in the last 168 hours. Cardiac Enzymes: No results for input(s): CKTOTAL, CKMB, CKMBINDEX, TROPONINI in the last 168 hours. BNP (last 3 results) No results for input(s): PROBNP in the last 8760 hours. HbA1C: No results for input(s): HGBA1C in the last 72 hours. CBG: No results for input(s): GLUCAP in the last 168 hours. Lipid Profile: No results for input(s): CHOL, HDL, LDLCALC, TRIG, CHOLHDL, LDLDIRECT in the last 72 hours. Thyroid Function Tests: No results for input(s): TSH, T4TOTAL, FREET4, T3FREE, THYROIDAB in the last 72 hours. Anemia Panel: No results for input(s): VITAMINB12, FOLATE, FERRITIN, TIBC, IRON, RETICCTPCT in the last 72 hours. Sepsis Labs: No results for input(s): PROCALCITON, LATICACIDVEN in the last 168 hours.  No results found for this or any previous visit (from the past 240 hour(s)).    Radiology Studies: No results found.   Scheduled Meds:  calcium carbonate  1 tablet Oral TID WC   Chlorhexidine Gluconate Cloth  6 each Topical Daily   folic acid  1 mg Oral Daily   metoprolol tartrate  12.5 mg Oral BID   midodrine  10 mg Oral TID  WC   multivitamin with minerals  1 tablet Oral Daily   pantoprazole  40 mg Oral Daily   sodium chloride flush  3 mL Intravenous  Q12H   thiamine  100 mg Oral Daily   Continuous Infusions:  sodium chloride       LOS: 14 days    Time spent: 25 minutes   Bonnell Public, MD [facility]  If 7PM-7AM, please contact night-coverage [email or web address] 02/07/2021, 5:15 PM

## 2021-02-08 LAB — RENAL FUNCTION PANEL
Albumin: 2.8 g/dL — ABNORMAL LOW (ref 3.5–5.0)
Anion gap: 12 (ref 5–15)
BUN: 99 mg/dL — ABNORMAL HIGH (ref 8–23)
CO2: 24 mmol/L (ref 22–32)
Calcium: 8.6 mg/dL — ABNORMAL LOW (ref 8.9–10.3)
Chloride: 98 mmol/L (ref 98–111)
Creatinine, Ser: 4.68 mg/dL — ABNORMAL HIGH (ref 0.61–1.24)
GFR, Estimated: 12 mL/min — ABNORMAL LOW (ref >60)
Glucose, Bld: 118 mg/dL — ABNORMAL HIGH (ref 70–99)
Phosphorus: 5.1 mg/dL — ABNORMAL HIGH (ref 2.5–4.6)
Potassium: 5.3 mmol/L — ABNORMAL HIGH (ref 3.5–5.1)
Sodium: 134 mmol/L — ABNORMAL LOW (ref 135–145)

## 2021-02-08 LAB — MAGNESIUM: Magnesium: 2.5 mg/dL — ABNORMAL HIGH (ref 1.7–2.4)

## 2021-02-08 NOTE — Progress Notes (Signed)
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  He was found to be severely volume overloaded on presentation and was started on IV Lasix.  Cardiology also consulted and was following. Volume overload has significantly improved during this hospitalization, he also underwent paracentesis twice.  He was started on midodrine for hypotension.  Lab work on 02/03/2021 showed new onset AKI on CKD. Nephrology evaluated and noted that patient was not a candidate for outpatient dialysis treatment.  The goal of care is for patient to be discharged to his nephew's house with hospice.  There has been mention of patient and/or patient's family requesting PT on discharge.  Transition of care team has been consulted.  Patient is stable from medical point to be discharged with hospice follow-up.   Assessment & Plan: Volume overload/severe systolic congestive heart failure Acute on chronic systolic heart failure/congestive cardiomyopathy.   Anasarca Liver cirrhosis/hypoalbuminemia     - Echocardiogram showed ejection fraction of 10-20%     - was started on IV diuretics, now held due to AKI.       - Stopped hydralazine, Imdur because of low blood pressure.      - On low dose  metoprolol and midodrine.     - Contnue compression wraps for bilateral lower extremity edema.     - cardiology has evaluated; there are plans follow-up with heart failure clinic.     -Patient will be discharged with hospice follow-up.       -Transition of care team has been consulted.  The plan is to discharge patient to his nephew's house with hospice follow-up.  Patient may need paracentesis from time to time on discharge.    AKI vs CKD stage IV/hyperkalemia     - Kidney function had previously  improved  with diuresis.     - His baseline creatinine is around 2.5.       - Developed nausea and vomiting on 02/02/2021 and also became hypotensive     - Nephrology was consulted, he is not a candidate for outpt HD; they have recommended hospice     - he was given lokelma for his hyperK+     -Check renal panel today.  Nausea/vomiting abdominal discomfort     - Continue Zofran as needed, Protonix.     -resolved significantly.   Ascites History of liver cirrhosis from alcohol.     - s/p paracentesis with removal of 7 L of fluid on admission.       - Has been getting  paracentesis once a month for the last 3 months.     - Underwent another session of paracentesis on 5/31 with removal of 4.3 L of fluid.     [name]  gastroenterology arranged  a follow-up appointment as an outpatient so that he can be referred fo  paracentesis to IR/radiology in the future.     -  He has an an appointment with [name] on 6/16 at 9: 30 am     - 6/9: Patient is comfort care measures at this time; pt/family have discussed the inevitability of his situation and agree that home hospice is the best course of action. Currently working on arrangements to live with his nephew     -patient may need intermittent paracentesis from time to time.   Hypotension     - Imdur, hydralazine discontinued.        - continue midodrine     -stable.   Chest pain/Vtach     - He had left-sided chest pain on 01/31/21,now resolved.      - EKG did not show any acute ischemic changes.     - Has intermittent episodes of nonsustained V. tach.      - 6/9: Patient is comfort care measures at this time; pt/family have discussed the inevitability of his situation and agree that home hospice is the best course of action. Currently working on arrangements to live with his nephew   Left ventricular thrombus     - Not a candidate for anticoagulation due to noncompliance, GI bleed     - 6/9: Patient is comfort care measures at this time; pt/family have discussed the inevitability of his situation and agree that home hospice is the best course of action. Currently working on arrangements to live with his nephew   Normocytic anemia     - Hemoglobin is 7.8.      - Anemia is most likely associated with CKD.       - No evidence of acute blood loss. Iron studies normal.     - follow   Severe BPH/chronic bladder outlet obstruction     - On chronic indwelling Foley catheter.  Changed monthly.   Chronic alcoholism     - Reports quitting drinking 6 months ago     - No signs of withdrawal     - Continue thiamine, folic acid.   Chronic bilateral lower extremity wound     - Follows at wound care center, [name].  Debility/deconditioning     - PT/OT rec HHPT/OT on discharge     - he would like to go home with hospice or palliative services  DVT prophylaxis: none, comfort care measures Code Status: DNR Family Communication: w/ sister by phone   Status is: Inpatient  Remains inpatient appropriate because:Inpatient level of care appropriate due to severity of illness  Dispo: The patient is from: Home              Anticipated d/c is to: Home with hospice follow-up.              Patient currently is medically stable for d/c.   Difficult to place patient No  Consultants:  Palliative Care Nephrology Cardiology  Procedures:  Paracentesis  Antimicrobials:  None currently   Subjective: No new complaints today.     Objective: Vitals:   02/07/21 2055 02/07/21 2124 02/08/21 0446 02/08/21 0451  BP:  105/61 120/66   Pulse:   60   Resp:   20   Temp:   (!) 97.4 F (36.3 C)   TempSrc:      SpO2:   100%   Weight:    86.5 kg  Height: 5\' 11"  (1.803 m)       Intake/Output Summary (Last 24 hours) at 02/08/2021 1353 Last data filed at 02/08/2021 0200 Gross per 24 hour  Intake --  Output 675 ml  Net -675 ml    Filed Weights   02/06/21 0500 02/07/21 0500 02/08/21 0451  Weight: 86.4 kg 85.4 kg 86.5 kg    Examination:  General: 80 y.o. male resting in bed in NAD Eyes: PERRL, normal sclera ENMT: Nares patent w/o discharge, orophaynx clear, dentition normal, ears w/o discharge/lesions/ulcers Neck: Supple, trachea midline Cardiovascular: RRR, +S1, S2, no m/g/r, equal pulses throughout Respiratory: CTABL, no w/r/r, normal WOB GI: BS+, mild distention, NT, no masses noted, no organomegaly noted MSK: No e/c/c; BLE dressings CDI Skin: No rashes, bruises, ulcerations noted Neuro: A&O x 3, no focal deficits Psyc: Appropriate interaction and affect, calm/cooperative   Data Reviewed: I have personally reviewed following labs and imaging studies.  CBC: Recent Labs  Lab 02/03/21 0442 02/05/21 0507  WBC 4.9 4.0  NEUTROABS 3.7 3.1  HGB 7.7* 7.8*  HCT 26.0* 25.8*  MCV 91.2 91.2  PLT 329 354    Basic Metabolic Panel: Recent Labs  Lab 02/03/21 0442 02/04/21 0520 02/05/21 0507  NA 133* 134* 134*  K 5.0 5.9* 5.6*  CL 95* 99 99  CO2 26 24 24   GLUCOSE 109* 86 92  BUN 89* 90* 97*  CREATININE 3.43* 3.98* 4.29*  CALCIUM 8.4* 8.4* 8.4*  PHOS  --  5.3* 5.6*    GFR: Estimated Creatinine Clearance: 15.4 mL/min (A) (by C-G formula based on SCr of 4.29 mg/dL (H)). Liver Function Tests: Recent Labs  Lab 02/03/21 1156 02/04/21 0520 02/05/21 0507  ALBUMIN 2.5* 3.1* 2.8*    No results for input(s): LIPASE, AMYLASE in the last 168 hours. No results for input(s): AMMONIA in the last 168  hours. Coagulation Profile: No results for input(s): INR, PROTIME in the last 168 hours. Cardiac Enzymes: No results for input(s): CKTOTAL, CKMB, CKMBINDEX, TROPONINI in the last 168 hours. BNP (last 3 results) No results for input(s): PROBNP in the last 8760 hours. HbA1C: No results for input(s): HGBA1C in the last 72 hours. CBG: No results for input(s): GLUCAP in the last 168 hours. Lipid Profile: No results for input(s): CHOL, HDL, LDLCALC, TRIG, CHOLHDL, LDLDIRECT in the last 72 hours. Thyroid Function Tests: No results for input(s): TSH, T4TOTAL, FREET4, T3FREE, THYROIDAB in the last 72 hours. Anemia Panel: No results for input(s): VITAMINB12, FOLATE, FERRITIN, TIBC, IRON, RETICCTPCT in the last 72 hours. Sepsis Labs: No results for input(s): PROCALCITON, LATICACIDVEN in the last 168 hours.  No results found for this or any previous visit (from the past 240 hour(s)).    Radiology Studies: No results found.   Scheduled Meds:  calcium carbonate  1 tablet Oral TID WC   Chlorhexidine Gluconate Cloth  6 each Topical Daily   folic acid  1 mg Oral Daily   metoprolol tartrate  12.5 mg Oral BID   midodrine  10 mg Oral TID WC   multivitamin with minerals  1 tablet Oral Daily   pantoprazole  40 mg Oral Daily   sodium chloride flush  3 mL Intravenous Q12H   thiamine  100 mg Oral Daily   Continuous Infusions:  sodium chloride       LOS: 15 days    Time spent: 25 minutes   Bonnell Public, MD [facility]  If 7PM-7AM, please contact night-coverage [email or web address] 02/08/2021, 1:53 PM

## 2021-02-09 MED ORDER — POLYETHYLENE GLYCOL 3350 17 G PO PACK
17.0000 g | PACK | Freq: Every day | ORAL | Status: DC | PRN
  Administered 2021-02-09: 17 g via ORAL
  Filled 2021-02-09: qty 1

## 2021-02-09 NOTE — TOC Progression Note (Addendum)
Transition of Care [facility]) - Progression Note    Patient Details  Name: Kordell Jafri MRN: [id number] Date of Birth: 1940/11/15  Transition of Care [facility]) CM/SW Contact  Kendallyn Lippold, Juliann Pulse, RN Phone Number: 02/09/2021, 9:52 AM  Clinical Narrative:  Spoke to patient's sister Bernice-[phone]-want [facility] for hospice services-referral called in-Misty aware-dme needed hospital bed,02;[place]. For d/c once DME arrives-family available in the home after 4p.DNR, PTAR @ d/c once dme is in the home. 3:58p-Spoke to Marshell Garfinkel is in agreement for d/c today even though she told [facility] that she wanted to have dme delivered tomorrow. [facility] rep Rojelio Brenner is aware of delivery of dme today. Once dme is in the home PTAR can be called Nsg aware.DNR form in shadow chart for MD signature.    Expected Discharge Plan: Home w Hospice Care Barriers to Discharge: Continued Medical Work up  Expected Discharge Plan and Services Expected Discharge Plan: [place]   Discharge Planning Services: CM Consult   Living arrangements for the past 2 months: Single Family Home Expected Discharge Date: 02/02/21                         HH Arranged: PT, RN [place] Agency: [place] Date [place]: 02/02/21 Time [place]: 1053 Representative spoke with at [place]: [place] Determinants of Health ([place]) Interventions    Readmission Risk Interventions No flowsheet data found.

## 2021-02-09 NOTE — Discharge Summary (Addendum)
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  He was found to be severely volume noted and was started on IV diuretics by cardiology.  He also underwent paracentesis twice during hospitalization.  Started on midodrine for hypotension.  Nephrology was consulted due to acute kidney injury on CKD and thought to be a candidate not optimal for dialysis.  Palliative care was consulted and the plan is to proceed with home hospice on discharge.  Following problems were addressed during his hospitalization:  Volume overload/severe systolic congestive heart failure: Secondary to acute on chronic systolic  heart failure/congestive cardiomyopathy.  Patient was seen by cardiology and received diuretics during hospitalization.  Will be continued on compression wrap for his bilateral lower extremity edema.    Ascites: Likely secondary to heart failure and cirrhosis of liver.  Received paracentesis twice during hospitalization.  Will need diuretic regimen.  Patient is to follow-up with [facility] as outpatient.  He does have an appointment with GI on 02/12/2021 as well.     Hypotension: Imdur, hydralazine discontinued.    Started on midodrine,with bp improvement   Chest pain/Vatch:   Had intermittent episodes of nonsustained V. tach.  Left ventricular thrombus: Patient is not a good candidate for anticoagulation due to noncompliance and GI bleed.   AKI on CKD stage IV with hyperkalemia.:  Baseline creatinine around 2.5.  Patient was nauseous so nephrology was consulted but patient is not a candidate for hemodialysis and they recommended hospice..   Normocytic anemia: secondary to underlying kidney disease.   Severe BPH/chronic bladder outlet obstruction: On chronic indwelling Foley catheter which is changed monthly.  Chronic alcoholism: Received thiamine and folic acid during hospitalization.  Quit 6 months ago.  No signs of withdrawal at this time.  Chronic bilateral lower extremity wound: Follows at wound care center,Dr Dellia Nims.     Debility/deconditioning/goals of care: At this time patient has been considered for home with home hospice.  Discharge Diagnoses:  Principal Problem:   CHF exacerbation ([place]) Active Problems:   Diabetes mellitus without complication ([place])   Alcohol abuse   Essential hypertension   CKD (chronic kidney disease), stage IV (H[facility])   Pressure injury of skin   Palliative care by specialist   Goals of care, counseling/discussion   General weakness   Discharge Instructions  Discharge Instructions     Ambulatory referral to Nephrology   Complete by: As directed    In 2 weeks   Diet - low sodium heart healthy   Complete by: As directed    Discharge instructions   Complete by: As directed    1)Please take prescribed medications as instructed. Follow-up with your hospice care provider.    Discharge wound care:   Complete by: As directed  As per wound nurse,wound clinic   Increase activity slowly   Complete by: As directed        Allergies as of 02/09/2021   No Known Allergies      Medication List     STOP taking these medications    aspirin EC 81 MG tablet   carvedilol 3.125 MG tablet Commonly known as: COREG       TAKE these medications    calcium carbonate 500 MG chewable tablet Commonly known as: TUMS - dosed in mg elemental calcium Chew 1 tablet (200 mg of elemental calcium total) by mouth 3 (three) times daily as needed for indigestion or heartburn.   folic acid 1 MG tablet Commonly known as: FOLVITE Take 1 tablet (1 mg total) by mouth daily.   ibuprofen 200 MG tablet Commonly known as: ADVIL Take 200 mg by mouth every 6 (six) hours as needed.   metoprolol tartrate 25 MG tablet Commonly known as: LOPRESSOR Take 0.5 tablets (12.5 mg total) by mouth 2 (two) times daily.   midodrine 10 MG tablet Commonly known as: PROAMATINE Take 1 tablet (10 mg total) by mouth 3 (three) times daily with meals.   pantoprazole 40 MG tablet Commonly known as: Protonix Take 1 tablet (40 mg total) by mouth daily. What changed: when to take this   thiamine 100 MG tablet Take 1 tablet (100 mg total) by mouth daily.   torsemide 20 MG tablet Commonly known as: DEMADEX Take 2 tablets (40 mg total) by mouth daily.               Discharge Care Instructions  (From admission, onward)           Start     Ordered   02/02/21 0000  Discharge wound care:       Comments: As per wound nurse,wound clinic   02/02/21 1035            Follow-up Information     [facility] Follow up.   Why: [facility]/HH physical therapy Contact information: [place] [address] [facility] Follow up on 02/06/2021.   Specialty: Cardiology Why: At [facility] for your heart failure follow up appointment  Address is [address].  Free valet parking is available at [employer] [address].  Please bring all medications  with you to this visit.  Clinic number for questions is [phone]. Contact information: [address] [id number] [place]        [name], MD. Schedule an appointment as soon as possible for a visit in 1 week(s).   Specialty: Family Medicine Contact information: [place] [address] [phone]         Vladimir Crofts, PA-C. Go in 10 day(s).   Specialty: Gastroenterology Why: You have an appointment on 02/12/2021 at 9:30 AM. Contact information: [place] [address] [phone]                No Known Allergies  Consultations: Cardiology   Procedures/Studies: DG Chest 2 View  Result Date: 01/23/2021 CLINICAL DATA:  Shortness of breath.  Exertional dyspnea. EXAM: CHEST - 2 VIEW COMPARISON:  Most recent radiograph 12/04/2020 FINDINGS: Stable cardiomegaly, least moderate in degree. Stable mediastinal contours. Bilateral pleural effusions are new from prior. Vascular congestion. Fluid in the fissures. No  pneumothorax or confluent consolidation. No acute osseous abnormalities are seen. IMPRESSION: CHF with cardiomegaly, vascular congestion, fluid in the fissures, and small bilateral pleural effusions. Electronically Signed   By: Keith Rake M.D.   On: 01/23/2021 22:14   US Paracentesis  Result Date: 01/27/2021 INDICATION: Patient with history of alcoholic cirrhosis, CHF, end-stage cardiomyopathy, chronic kidney disease, recurrent ascites. Request received for therapeutic paracentesis. EXAM: ULTRASOUND GUIDED THERAPEUTIC PARACENTESIS MEDICATIONS: 1% lidocaine to skin and subcutaneous tissue COMPLICATIONS: None immediate. PROCEDURE: Informed written consent was obtained from the patient after a discussion of the risks, benefits and alternatives to treatment. A timeout was performed prior to the initiation of the procedure. Initial ultrasound scanning demonstrates  a large amount of ascites within the right lower abdominal quadrant. The right lower abdomen was prepped and draped in the usual sterile fashion. 1% lidocaine was used for local anesthesia. Following this, a 19 gauge, 10-cm, Yueh catheter was introduced. An ultrasound image was saved for documentation purposes. The paracentesis was performed. The catheter was removed and a dressing was applied. The patient tolerated the procedure well without immediate post procedural complication. FINDINGS: A total of approximately 4.3 liters of milky/cream colored fluid was removed. IMPRESSION: Successful ultrasound-guided therapeutic paracentesis yielding 4.3 liters of peritoneal fluid. Read by: Rowe Robert, PA-C Electronically Signed   By: [name].  Shick M.D.   On: 01/27/2021 12:11   US Paracentesis  Result Date: 01/24/2021 INDICATION: Patient with history of heart failure, unspecified liver cirrhosis, chronic alcoholism, abdominal distension and recurrent ascites. Request is for therapeutic paracentesis. EXAM: ULTRASOUND GUIDED THERAPEUTIC PARACENTESIS MEDICATIONS: Lidocaine 1% 10 mL COMPLICATIONS: None immediate. PROCEDURE: Informed written consent was obtained from the patient after a discussion of the risks, benefits and alternatives to treatment. A timeout was performed prior to the initiation of the procedure. Initial ultrasound scanning demonstrates a large amount of ascites within the right lower abdominal quadrant. The right lower abdomen was prepped and draped in the usual sterile fashion. 1% lidocaine was used for local anesthesia. Following this, a 19 gauge, 7-cm, Yueh catheter was introduced. An ultrasound image was saved for documentation purposes. The paracentesis was performed. The catheter was removed and a dressing was applied. The patient tolerated the procedure well without immediate post procedural complication. Patient received post-procedure intravenous albumin; see nursing notes for details. FINDINGS: A  total of approximately 7.4 L of milky yellow fluid was removed. IMPRESSION: Successful ultrasound-guided paracentesis yielding 7.4 liters of peritoneal fluid. Read by: Rushie Nyhan, NP Electronically Signed   By: Sandi Mariscal M.D.   On: 01/24/2021 13:19     Subjective: Today, patient was seen and examined at bedside.  Patient feels okay about going home.  Denies any chest pain, shortness of breath, fever or chills.  Patient's spouse at bedside.  Discharge Exam: Vitals:   02/08/21 2107 02/09/21 1255  BP: 107/62 106/64  Pulse: 63 60  Resp: (!) 22 19  Temp: (!) 97.5 F (36.4 C) (!) 97.4 F (36.3 C)  SpO2: 99% 100%   Vitals:   02/08/21 0451 02/08/21 1420 02/08/21 2107 02/09/21 1255  BP:  101/71 107/62 106/64  Pulse:  61 63 60  Resp:  18 (!) 22 19  Temp:  (!) 97.5 F (36.4 C) (!) 97.5 F (36.4 C) (!) 97.4 F (36.3 C)  TempSrc:  Oral Oral   SpO2:  100% 99% 100%  Weight: 86.5 kg     Height:        General:  Average built, not in obvious distress  HENT:   No scleral pallor or icterus noted. Oral mucosa is moist.  Chest:  Clear breath sounds.  Diminished breath sounds bilaterally. No crackles or wheezes.  CVS: S1 &S2 heard. No murmur.  Regular rate and rhythm. Abdomen: Soft, nontender, nondistended.  Mild abdominal distention.  Bowel sounds are heard.   Extremities: No cyanosis, clubbing or edema.  Peripheral pulses are palpable.  Bilateral lower extremity dressings  Psych: Alert, awake and oriented, normal mood CNS:  No cranial nerve deficits.  Power equal in all extremities.   Skin: Warm and dry.  Lower extremity on wraps.  Discoloration noted.   The results of significant diagnostics from this hospitalization (including imaging, microbiology, ancillary and laboratory) are listed below for reference.     Microbiology: No results found for this or any previous visit (from the past 240 hour(s)).    Labs: BNP (last 3 results) Recent Labs    10/11/20 1606 12/04/20 0701  01/23/21 2146  BNP 4,154.2* 2,567.2* 2,820.0*    Basic Metabolic Panel: Recent Labs  Lab 02/03/21 0442 02/04/21 0520 02/05/21 0507 02/08/21 1439  NA 133* 134* 134* 134*  K 5.0 5.9* 5.6* 5.3*  CL 95* 99 99 98  CO2 26 24 24 24   GLUCOSE 109* 86 92 118*  BUN 89* 90* 97* 99*  CREATININE 3.43* 3.98* 4.29* 4.68*  CALCIUM 8.4* 8.4* 8.4* 8.6*  MG  --   --   --  2.5*  PHOS  --  5.3* 5.6* 5.1*    Liver Function Tests: Recent Labs  Lab 02/03/21 1156 02/04/21 0520 02/05/21 0507 02/08/21 1439  ALBUMIN 2.5* 3.1* 2.8* 2.8*   No results for input(s): LIPASE, AMYLASE in the last 168 hours. No results for input(s): AMMONIA in the last 168 hours. CBC: Recent Labs  Lab 02/03/21 0442 02/05/21 0507  WBC 4.9 4.0  NEUTROABS 3.7 3.1  HGB 7.7* 7.8*  HCT 26.0* 25.8*  MCV 91.2 91.2  PLT 329 353    Cardiac Enzymes: No results for input(s): CKTOTAL, CKMB, CKMBINDEX, TROPONINI in the last 168 hours. BNP: Invalid input(s): POCBNP CBG: No results for input(s): GLUCAP in the last 168 hours. D-Dimer No results for input(s): DDIMER in the last 72 hours. Hgb A1c No results for input(s): HGBA1C in the last 72 hours. Lipid Profile No results for input(s): CHOL, HDL, LDLCALC, TRIG, CHOLHDL, LDLDIRECT in the last 72 hours. Thyroid function studies No results for input(s): TSH, T4TOTAL, T3FREE, THYROIDAB in the last 72 hours.  Invalid input(s): FREET3 Anemia work up No results for input(s): VITAMINB12, FOLATE, FERRITIN, TIBC, IRON, RETICCTPCT in the last 72 hours. Urinalysis    Component Value Date/Time   COLORURINE AMBER (A) 02/06/2021 1455   APPEARANCEUR CLOUDY (A) 02/06/2021 1455   LABSPEC 1.016 02/06/2021 1455   PHURINE 5.0 02/06/2021 1455   GLUCOSEU NEGATIVE 02/06/2021 1455   HGBUR MODERATE (A) 02/06/2021 1455   BILIRUBINUR NEGATIVE 02/06/2021 1455   KETONESUR NEGATIVE 02/06/2021 1455   PROTEINUR 100 (A) 02/06/2021 1455   NITRITE NEGATIVE 02/06/2021 1455   LEUKOCYTESUR LARGE  (A) 02/06/2021 1455   Sepsis Labs Invalid input(s): PROCALCITONIN,  WBC,  LACTICIDVEN Microbiology No results found for this or any previous visit (from the past 240 hour(s)).   Please note: You were cared for by a hospitalist during your hospital stay. Once you are discharged, your primary care physician will handle any further medical issues. Please note that NO REFILLS for any discharge medications will be authorized once you are discharged, as it is  imperative that you return to your primary care physician (or establish a relationship with a primary care physician if you do not have one) for your post hospital discharge needs so that they can reassess your need for medications and monitor your lab values.   Time coordinating discharge: 40 minutes  SIGNED:   Flora Lipps, MD  [facility] 02/09/2021, 1:34 PM

## 2021-02-09 NOTE — Progress Notes (Signed)
Per day shift nurse, patient waiting for hospice equipment to be delivered to the house. I was told that I would be informed by the family when the equipment made it so that the patient could be transferred by [facility]. Have not heard anything regarding patient leaving at this time. Family contacted to check on the status of the equipment but there was no answer and patient was not on the PTAR list. Patient states he is just waiting for transport. Patient states he will be fine with leaving tomorrow once everything is ready for sure.

## 2021-02-09 NOTE — Consult Note (Signed)
[facility] Nurse wound follow up Patient receiving care in [place]. Wound type: no wounds on BLE 3 layer Profore lite removed, legs washed, dried, moisturizing ointment applied. 3 layer Profore lite applied BLE. Patient tolerated well. Val Riles, RN, MSN, CWOCN, CNS-BC, pager [phone]

## 2021-02-09 NOTE — Progress Notes (Signed)
[facility]) Hospital Liaison RN note Received request from  [facility] for hospice services at nephew's home after discharge. Chart and patient information under review by Hospice physician. Hospice eligibility pending at this time. Spoke with sister Oris Drone by phone and patient in room to initiate education related to hospice philosophy, services, and team approach to care. Patient/family verbalized understanding of information given. Per discussion, the plan is for discharge home by [facility] today or tomorrow DME needs discussed. Patient/family requests the following equipment for deliver: hospital bed and oxygen. Address has been verified as [address] [place] [address]. [name] at [phone] is the family contact to arrange time of equipment delivery.   Please send signed and completed DNR with patient/family. Please provide symptoms at discharge as needed for ongoing symptom management. [facility] information and contact numbers given to [facility] and Mr. Nasca. Above information shared with Juliann Pulse. Please call with any hospice related questions or concerns.   Thank you for the opportunity to participate in this patient's care.  Jhonnie Garner, Therapist, sports, BSN [employer] [phone]

## 2021-02-10 LAB — CBC
HCT: 30.8 % — ABNORMAL LOW (ref 39.0–52.0)
Hemoglobin: 9.1 g/dL — ABNORMAL LOW (ref 13.0–17.0)
MCH: 26.7 pg (ref 26.0–34.0)
MCHC: 29.5 g/dL — ABNORMAL LOW (ref 30.0–36.0)
MCV: 90.3 fL (ref 80.0–100.0)
Platelets: 316 10*3/uL (ref 150–400)
RBC: 3.41 MIL/uL — ABNORMAL LOW (ref 4.22–5.81)
RDW: 20.1 % — ABNORMAL HIGH (ref 11.5–15.5)
WBC: 4.3 10*3/uL (ref 4.0–10.5)
nRBC: 1.2 % — ABNORMAL HIGH (ref 0.0–0.2)

## 2021-02-10 LAB — BASIC METABOLIC PANEL
Anion gap: 12 (ref 5–15)
BUN: 100 mg/dL — ABNORMAL HIGH (ref 8–23)
CO2: 24 mmol/L (ref 22–32)
Calcium: 8.5 mg/dL — ABNORMAL LOW (ref 8.9–10.3)
Chloride: 98 mmol/L (ref 98–111)
Creatinine, Ser: 4.81 mg/dL — ABNORMAL HIGH (ref 0.61–1.24)
GFR, Estimated: 12 mL/min — ABNORMAL LOW (ref >60)
Glucose, Bld: 99 mg/dL (ref 70–99)
Potassium: 5.3 mmol/L — ABNORMAL HIGH (ref 3.5–5.1)
Sodium: 134 mmol/L — ABNORMAL LOW (ref 135–145)

## 2021-02-10 LAB — PHOSPHORUS: Phosphorus: 4.7 mg/dL — ABNORMAL HIGH (ref 2.5–4.6)

## 2021-02-10 LAB — MAGNESIUM: Magnesium: 2.6 mg/dL — ABNORMAL HIGH (ref 1.7–2.4)

## 2021-02-10 NOTE — TOC Transition Note (Signed)
Transition of Care [facility]) - CM/SW Discharge Note   Patient Details  Name: [name] MRN: [id number] Date of Birth: 06/27/1941  Transition of Care [facility]) CM/SW Contact:  Dessa Phi, RN Phone Number: 02/10/2021, 10:08 AM   Clinical Narrative: Confirmed w/Bernice sister dme arrived yesterday evening. Informed of PTAR called for d/c today. [facility] aware for hospice services.No further CM needs.      Final next level of care: Home w Hospice Care Barriers to Discharge: No Barriers Identified   Patient Goals and CMS Choice Patient states their goals for this hospitalization and ongoing recovery are:: To return back home. CMS Medicare.gov Compare Post Acute Care list provided to:: Patient Choice offered to / list presented to : Patient  Discharge Placement                       Discharge Plan and Services   Discharge Planning Services: CM Consult                      HH Arranged: PT, RN [facility] Agency: [place] Date [place]: 02/02/21 Time [place]: 1053 Representative spoke with at [place]: [place] ([place]) Interventions     Readmission Risk Interventions No flowsheet data found.

## 2021-02-10 NOTE — Progress Notes (Addendum)
Patient is still in the hospital despite being discharged on 02/09/2021 due to lack of DME at home.  Patient denies interval complaints.  Feels well.  Vitals reviewed.  No changes in his discharge plan.  Discharge instructions have been made 02/09/2021.  Medically stable for disposition home with home hospice.

## 2021-04-15 ENCOUNTER — Other Ambulatory Visit (HOSPITAL_COMMUNITY): Payer: Self-pay | Admitting: Hospice and Palliative Medicine

## 2021-04-15 DIAGNOSIS — K7031 Alcoholic cirrhosis of liver with ascites: Secondary | ICD-10-CM

## 2021-04-16 ENCOUNTER — Ambulatory Visit (HOSPITAL_COMMUNITY)
Admission: RE | Admit: 2021-04-16 | Discharge: 2021-04-16 | Disposition: A | Discharge status: Home / Self Care | Source: Ambulatory Visit | Attending: Hospice and Palliative Medicine | Admitting: Hospice and Palliative Medicine

## 2021-04-16 ENCOUNTER — Other Ambulatory Visit: Payer: Self-pay

## 2021-04-16 DIAGNOSIS — K7031 Alcoholic cirrhosis of liver with ascites: Secondary | ICD-10-CM | POA: Insufficient documentation

## 2021-04-16 MED ORDER — LIDOCAINE HCL 1 % IJ SOLN
INTRAMUSCULAR | Status: AC
  Filled 2021-04-16: qty 20

## 2021-04-16 NOTE — Procedures (Signed)
PROCEDURE SUMMARY:  Successful image-guided paracentesis from the right lower abdomen.  Yielded 3.8 liters of hazy yellow fluid.  No immediate complications.  EBL = trace. Patient tolerated well.   Specimen was not sent for labs.  Please see imaging section of Epic for full dictation.   Armando Gang Alycea Segoviano PA-C 04/16/2021 12:48 PM

## 2021-04-30 DEATH — deceased

## 2021-05-24 IMAGING — DX DG CHEST 1V PORT
1 series · 1 of 1 positions shown · non-contrast
Comparison: Chest x-ray 10/11/2020.

CLINICAL DATA: Ascites.  Shortness of breath.

EXAM:
PORTABLE CHEST 1 VIEW

[chest ap]
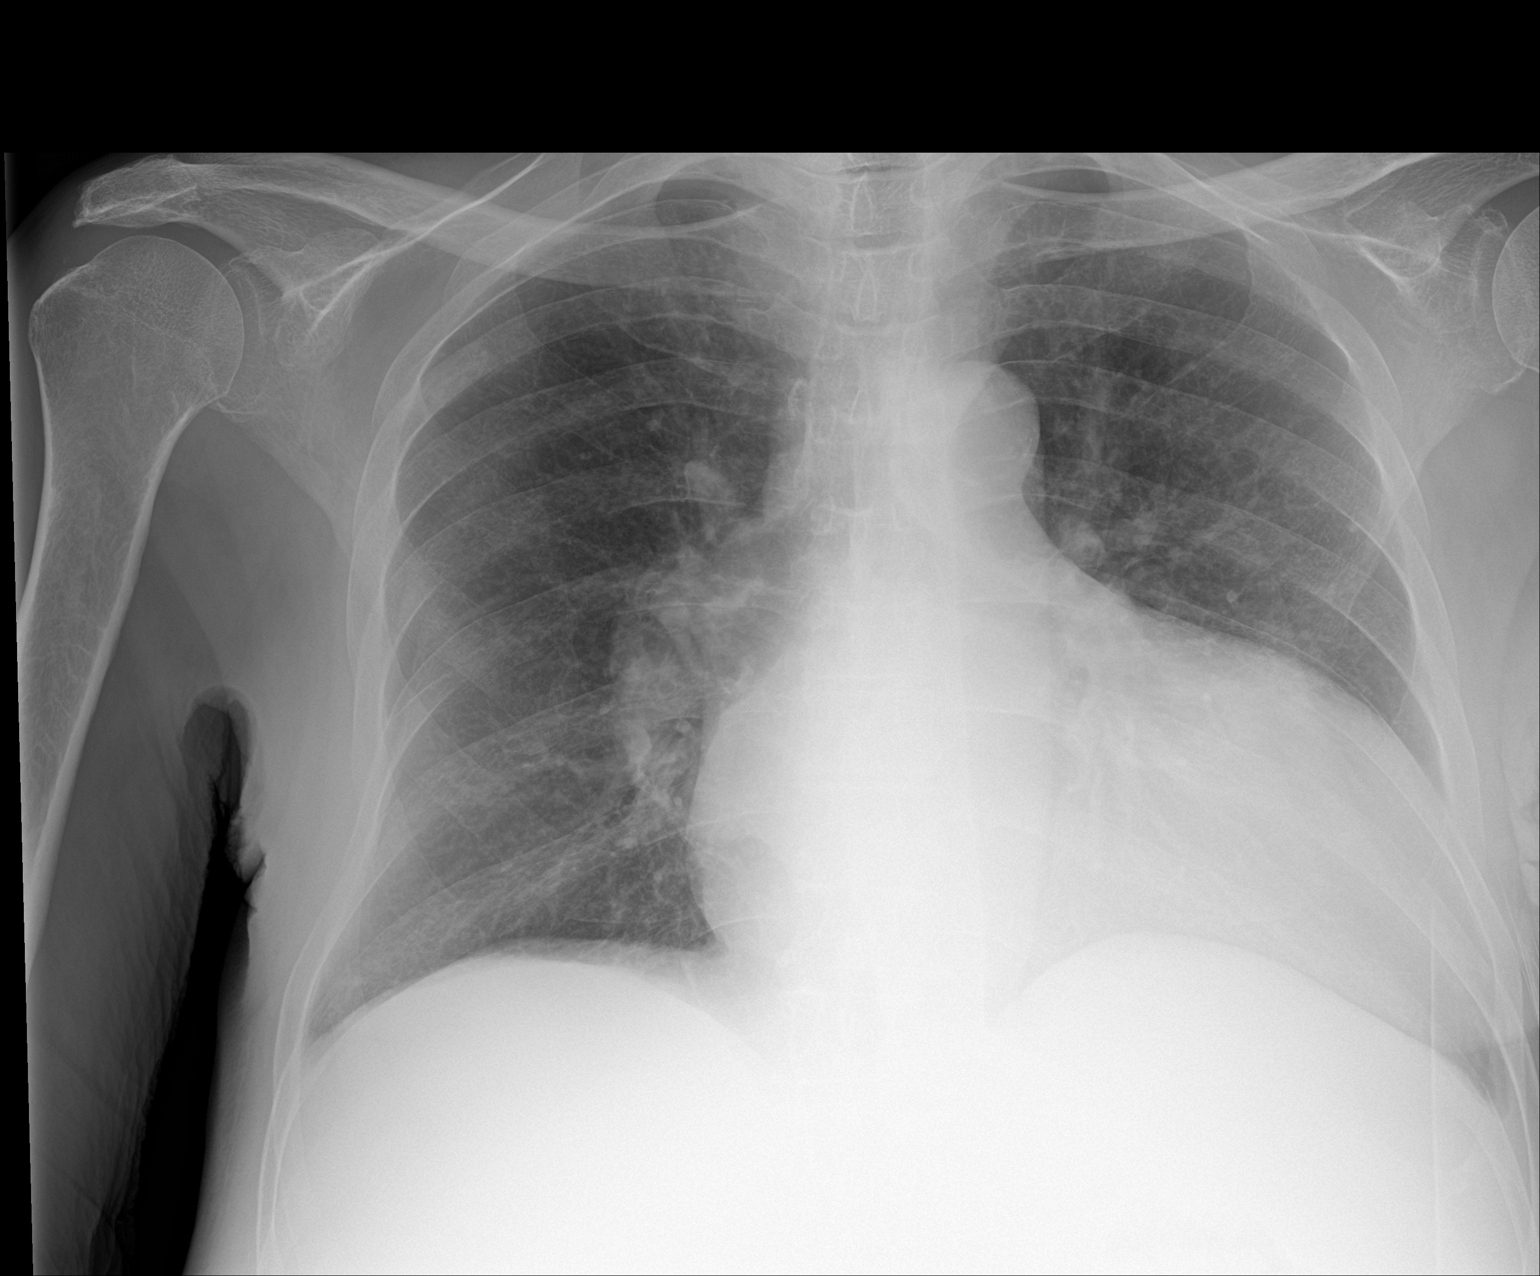

[1 of 1 positions shown; findings below may reference images not displayed]

FINDINGS: Mediastinum hilar structures are stable. Stable severe cardiomegaly.
Stable prominent central pulmonary arteries. No pulmonary venous
congestion. No focal infiltrate. No pleural effusion or
pneumothorax.
IMPRESSION: 1. Stable severe cardiomegaly and prominent central pulmonary
arteries. No pulmonary venous congestion.

2.  No acute infiltrate.
